# Patient Record
Sex: Male | Born: 1990 | Race: White | Hispanic: No | Marital: Married | Smoking: Never smoker
Health system: Southern US, Community
[De-identification: ages and names within clinical notes are randomized; demographics above are authoritative.]

## PROBLEM LIST (undated history)

## (undated) DIAGNOSIS — K631 Perforation of intestine (nontraumatic): Secondary | ICD-10-CM

## (undated) DIAGNOSIS — I10 Essential (primary) hypertension: Secondary | ICD-10-CM

## (undated) DIAGNOSIS — M109 Gout, unspecified: Secondary | ICD-10-CM

---

## 2012-12-21 ENCOUNTER — Emergency Department: Payer: Self-pay | Admitting: Emergency Medicine

## 2019-11-20 ENCOUNTER — Emergency Department: Admission: EM | Admit: 2019-11-20 | Discharge: 2019-11-20 | Discharge status: Against Medical Advice | Payer: Self-pay

## 2019-11-20 ENCOUNTER — Other Ambulatory Visit: Payer: Self-pay

## 2019-11-20 NOTE — ED Triage Notes (Signed)
PT STATES HE IS LEAVING.

## 2019-11-22 ENCOUNTER — Ambulatory Visit
Admission: EM | Admit: 2019-11-22 | Discharge: 2019-11-22 | Disposition: A | Discharge status: Home / Self Care | Payer: No Typology Code available for payment source | Source: Home / Self Care | Attending: Family Medicine | Admitting: Family Medicine

## 2019-11-22 ENCOUNTER — Encounter
Admission: EM | Disposition: A | Discharge status: Home / Self Care | Payer: Self-pay | Source: Home / Self Care | Attending: General Surgery

## 2019-11-22 ENCOUNTER — Ambulatory Visit (INDEPENDENT_AMBULATORY_CARE_PROVIDER_SITE_OTHER): Payer: No Typology Code available for payment source

## 2019-11-22 ENCOUNTER — Emergency Department: Payer: No Typology Code available for payment source

## 2019-11-22 ENCOUNTER — Inpatient Hospital Stay: Payer: No Typology Code available for payment source | Admitting: Anesthesiology

## 2019-11-22 ENCOUNTER — Other Ambulatory Visit: Payer: Self-pay

## 2019-11-22 ENCOUNTER — Inpatient Hospital Stay
Admission: EM | Admit: 2019-11-22 | Discharge: 2019-12-01 | DRG: 853 | Disposition: A | Discharge status: Home / Self Care | Payer: No Typology Code available for payment source | Source: Ambulatory Visit | Attending: General Surgery | Admitting: General Surgery

## 2019-11-22 ENCOUNTER — Encounter: Payer: Self-pay | Admitting: Emergency Medicine

## 2019-11-22 DIAGNOSIS — K668 Other specified disorders of peritoneum: Secondary | ICD-10-CM

## 2019-11-22 DIAGNOSIS — R52 Pain, unspecified: Secondary | ICD-10-CM

## 2019-11-22 DIAGNOSIS — R1010 Upper abdominal pain, unspecified: Secondary | ICD-10-CM

## 2019-11-22 DIAGNOSIS — R509 Fever, unspecified: Secondary | ICD-10-CM | POA: Diagnosis not present

## 2019-11-22 DIAGNOSIS — E669 Obesity, unspecified: Secondary | ICD-10-CM | POA: Diagnosis present

## 2019-11-22 DIAGNOSIS — Z87891 Personal history of nicotine dependence: Secondary | ICD-10-CM | POA: Insufficient documentation

## 2019-11-22 DIAGNOSIS — I10 Essential (primary) hypertension: Secondary | ICD-10-CM | POA: Diagnosis present

## 2019-11-22 DIAGNOSIS — R0602 Shortness of breath: Secondary | ICD-10-CM | POA: Insufficient documentation

## 2019-11-22 DIAGNOSIS — K5720 Diverticulitis of large intestine with perforation and abscess without bleeding: Secondary | ICD-10-CM | POA: Diagnosis present

## 2019-11-22 DIAGNOSIS — A419 Sepsis, unspecified organism: Secondary | ICD-10-CM | POA: Diagnosis not present

## 2019-11-22 DIAGNOSIS — Z20822 Contact with and (suspected) exposure to covid-19: Secondary | ICD-10-CM | POA: Insufficient documentation

## 2019-11-22 DIAGNOSIS — Z880 Allergy status to penicillin: Secondary | ICD-10-CM

## 2019-11-22 DIAGNOSIS — K56609 Unspecified intestinal obstruction, unspecified as to partial versus complete obstruction: Secondary | ICD-10-CM | POA: Insufficient documentation

## 2019-11-22 DIAGNOSIS — M109 Gout, unspecified: Secondary | ICD-10-CM | POA: Diagnosis present

## 2019-11-22 DIAGNOSIS — K56600 Partial intestinal obstruction, unspecified as to cause: Secondary | ICD-10-CM

## 2019-11-22 DIAGNOSIS — Z6834 Body mass index (BMI) 34.0-34.9, adult: Secondary | ICD-10-CM | POA: Diagnosis not present

## 2019-11-22 DIAGNOSIS — K650 Generalized (acute) peritonitis: Secondary | ICD-10-CM | POA: Diagnosis present

## 2019-11-22 DIAGNOSIS — K651 Peritoneal abscess: Secondary | ICD-10-CM

## 2019-11-22 DIAGNOSIS — R000 Tachycardia, unspecified: Secondary | ICD-10-CM | POA: Diagnosis not present

## 2019-11-22 DIAGNOSIS — K631 Perforation of intestine (nontraumatic): Secondary | ICD-10-CM

## 2019-11-22 HISTORY — PX: PARTIAL COLECTOMY: SHX5273

## 2019-11-22 HISTORY — PX: LAPAROTOMY: SHX154

## 2019-11-22 HISTORY — PX: COLOSTOMY: SHX63

## 2019-11-22 LAB — CBC WITH DIFFERENTIAL/PLATELET
Abs Immature Granulocytes: 0.15 10*3/uL — ABNORMAL HIGH (ref 0.00–0.07)
Basophils Absolute: 0 10*3/uL (ref 0.0–0.1)
Basophils Relative: 0 %
Eosinophils Absolute: 0 10*3/uL (ref 0.0–0.5)
Eosinophils Relative: 0 %
HCT: 44.1 % (ref 39.0–52.0)
Hemoglobin: 15.4 g/dL (ref 13.0–17.0)
Immature Granulocytes: 1 %
Lymphocytes Relative: 7 %
Lymphs Abs: 1.3 10*3/uL (ref 0.7–4.0)
MCH: 32.4 pg (ref 26.0–34.0)
MCHC: 34.9 g/dL (ref 30.0–36.0)
MCV: 92.6 fL (ref 80.0–100.0)
Monocytes Absolute: 1.6 10*3/uL — ABNORMAL HIGH (ref 0.1–1.0)
Monocytes Relative: 8 %
Neutro Abs: 15.7 10*3/uL — ABNORMAL HIGH (ref 1.7–7.7)
Neutrophils Relative %: 84 %
Platelets: 255 10*3/uL (ref 150–400)
RBC: 4.76 MIL/uL (ref 4.22–5.81)
RDW: 13 % (ref 11.5–15.5)
WBC: 18.7 10*3/uL — ABNORMAL HIGH (ref 4.0–10.5)
nRBC: 0 % (ref 0.0–0.2)

## 2019-11-22 LAB — PROTIME-INR
INR: 1.1 (ref 0.8–1.2)
Prothrombin Time: 14.4 seconds (ref 11.4–15.2)

## 2019-11-22 LAB — COMPREHENSIVE METABOLIC PANEL
ALT: 24 U/L (ref 0–44)
AST: 24 U/L (ref 15–41)
Albumin: 3.6 g/dL (ref 3.5–5.0)
Alkaline Phosphatase: 56 U/L (ref 38–126)
Anion gap: 15 (ref 5–15)
BUN: 17 mg/dL (ref 6–20)
CO2: 26 mmol/L (ref 22–32)
Calcium: 9.4 mg/dL (ref 8.9–10.3)
Chloride: 91 mmol/L — ABNORMAL LOW (ref 98–111)
Creatinine, Ser: 1.04 mg/dL (ref 0.61–1.24)
GFR calc Af Amer: >60 mL/min (ref >60)
GFR calc non Af Amer: >60 mL/min (ref >60)
Glucose, Bld: 148 mg/dL — ABNORMAL HIGH (ref 70–99)
Potassium: 3.5 mmol/L (ref 3.5–5.1)
Sodium: 132 mmol/L — ABNORMAL LOW (ref 135–145)
Total Bilirubin: 1.6 mg/dL — ABNORMAL HIGH (ref 0.3–1.2)
Total Protein: 8.4 g/dL — ABNORMAL HIGH (ref 6.5–8.1)

## 2019-11-22 LAB — LACTIC ACID, PLASMA: Lactic Acid, Venous: 1.3 mmol/L (ref 0.5–1.9)

## 2019-11-22 LAB — RESPIRATORY PANEL BY RT PCR (FLU A&B, COVID)
Influenza A by PCR: NEGATIVE
Influenza B by PCR: NEGATIVE
SARS Coronavirus 2 by RT PCR: NEGATIVE

## 2019-11-22 LAB — HIV ANTIBODY (ROUTINE TESTING W REFLEX): HIV Screen 4th Generation wRfx: NONREACTIVE

## 2019-11-22 LAB — SARS CORONAVIRUS 2 (TAT 6-24 HRS): SARS Coronavirus 2: NEGATIVE

## 2019-11-22 IMAGING — CR DG ABDOMEN DECUB ONLY 1V
2 series · 2 of 2 positions shown · non-contrast
Comparison: Chest x-ray same date.

CLINICAL DATA: Suspected free intraperitoneal air on chest x-ray.

EXAM:
ABDOMEN - 1 VIEW DECUBITUS

[abdomen decu (1 of 2)]
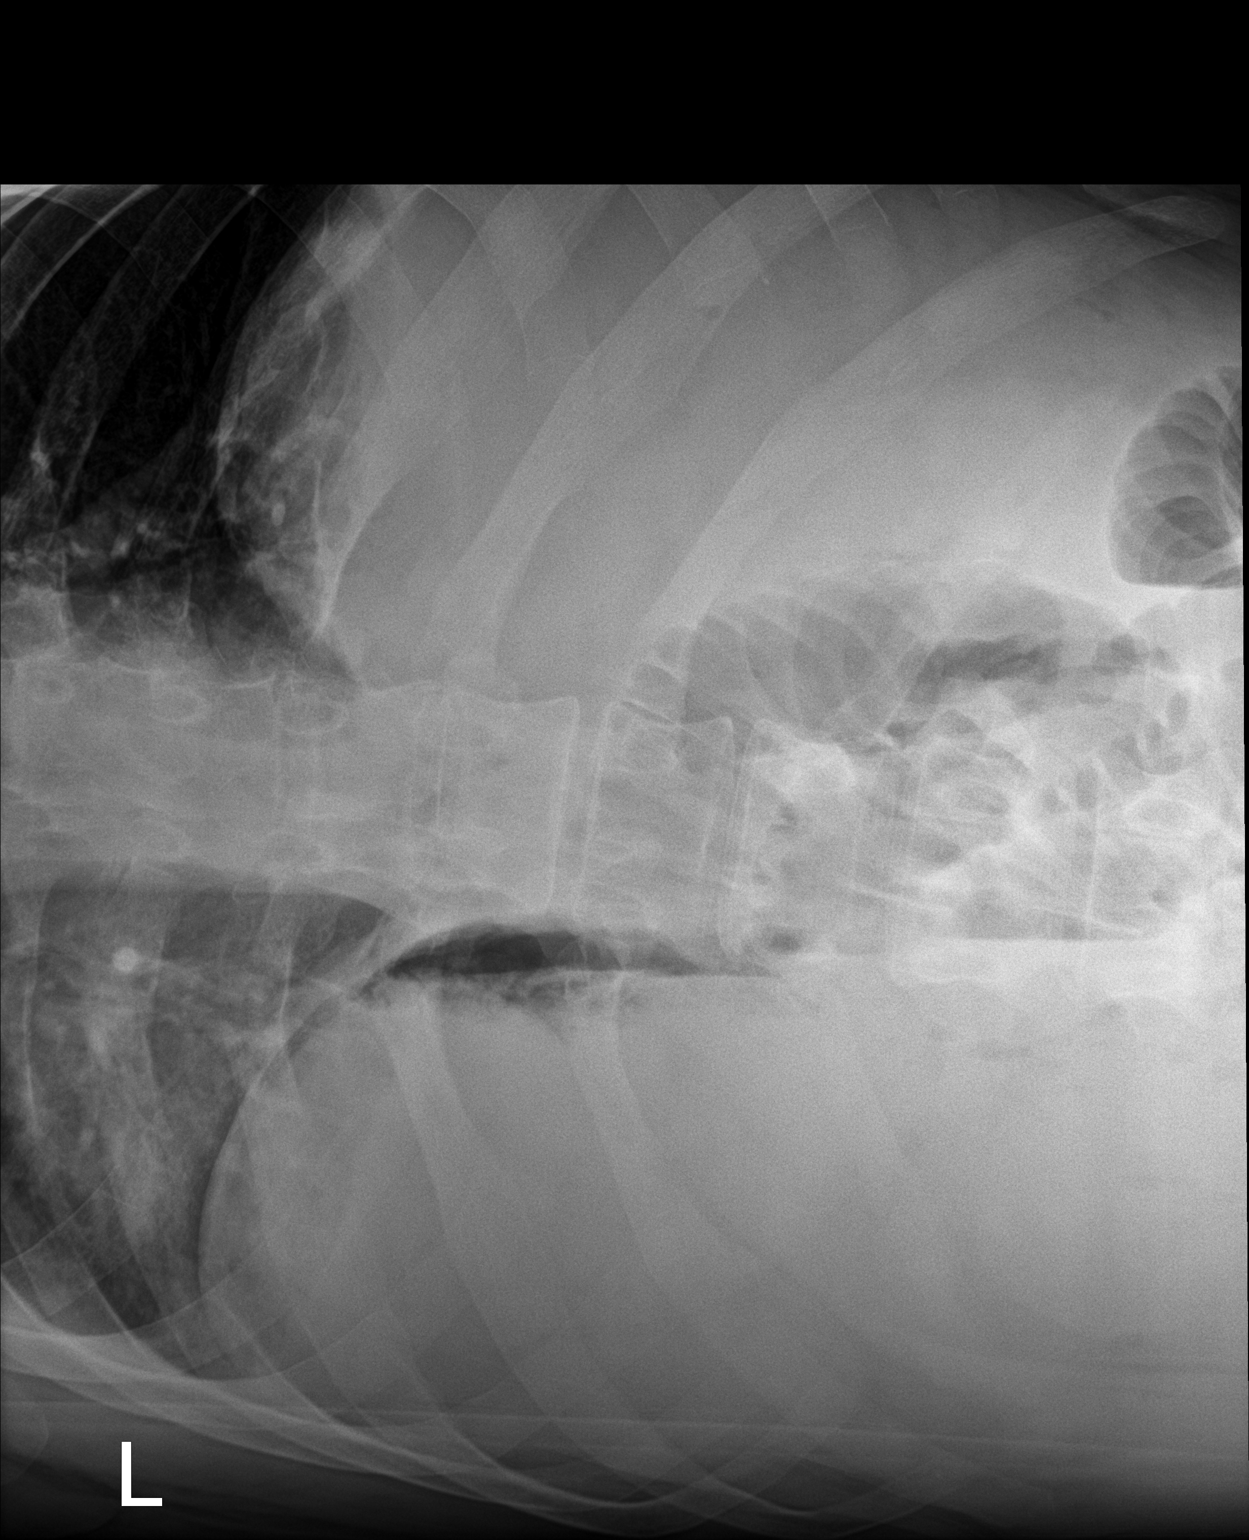

[abdomen decu (2 of 2)]
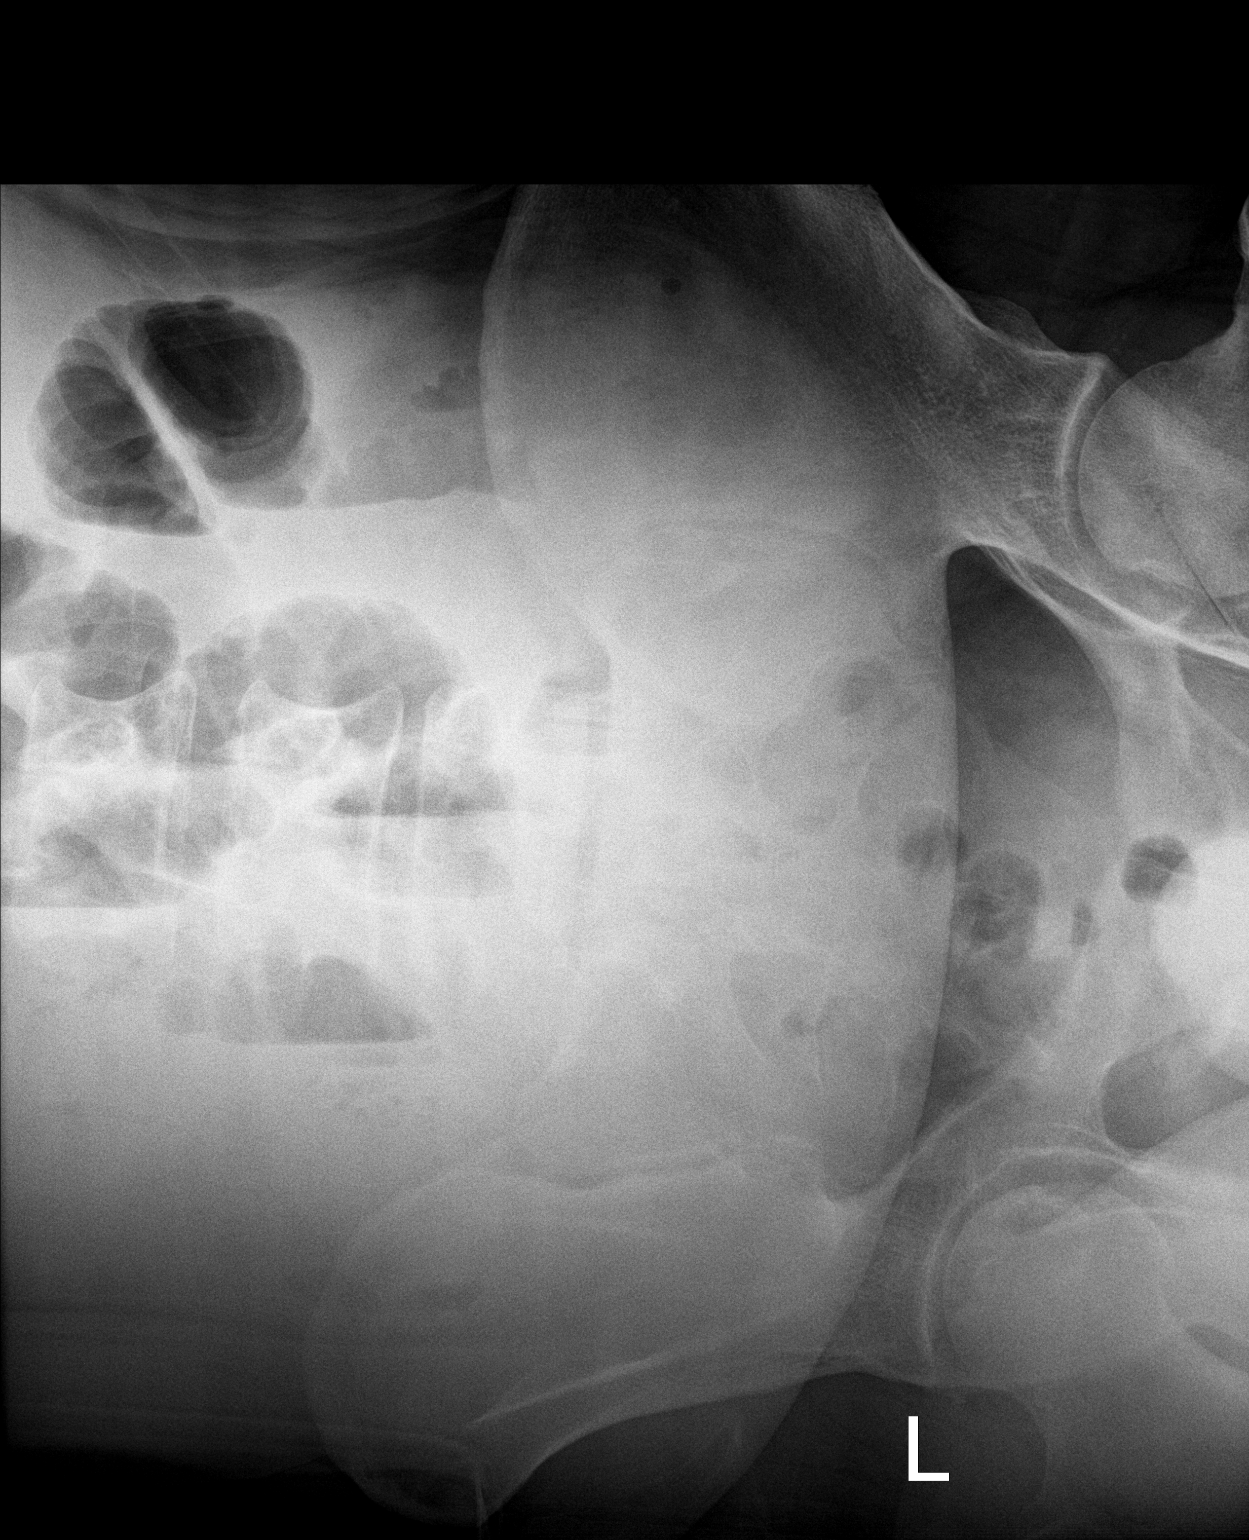

[2 of 2 positions shown; findings below may reference images not displayed]

FINDINGS: Dilated small bowel loops with air-fluid levels suggesting small
bowel obstruction. The decubitus film confirms free intraperitoneal
air suggesting hollow viscus perforation. Recommend surgical
consultation.

Bibasilar atelectasis.
IMPRESSION: 1. Free intraperitoneal air suggesting hollow viscus perforation.
Recommend surgical consultation and possible CT scan.
2. Dilated small bowel loops with air-fluid levels suggesting small
bowel obstruction.

These results were called by telephone at the time of interpretation
on [DATE] at [DATE] to provider NING , who verbally
acknowledged these results.

## 2019-11-22 IMAGING — CR DG CHEST 2V
2 series · 2 of 2 positions shown · non-contrast
Comparison: None.

CLINICAL DATA: Fever and shortness of breath

EXAM:
CHEST - 2 VIEW

[chest pa]
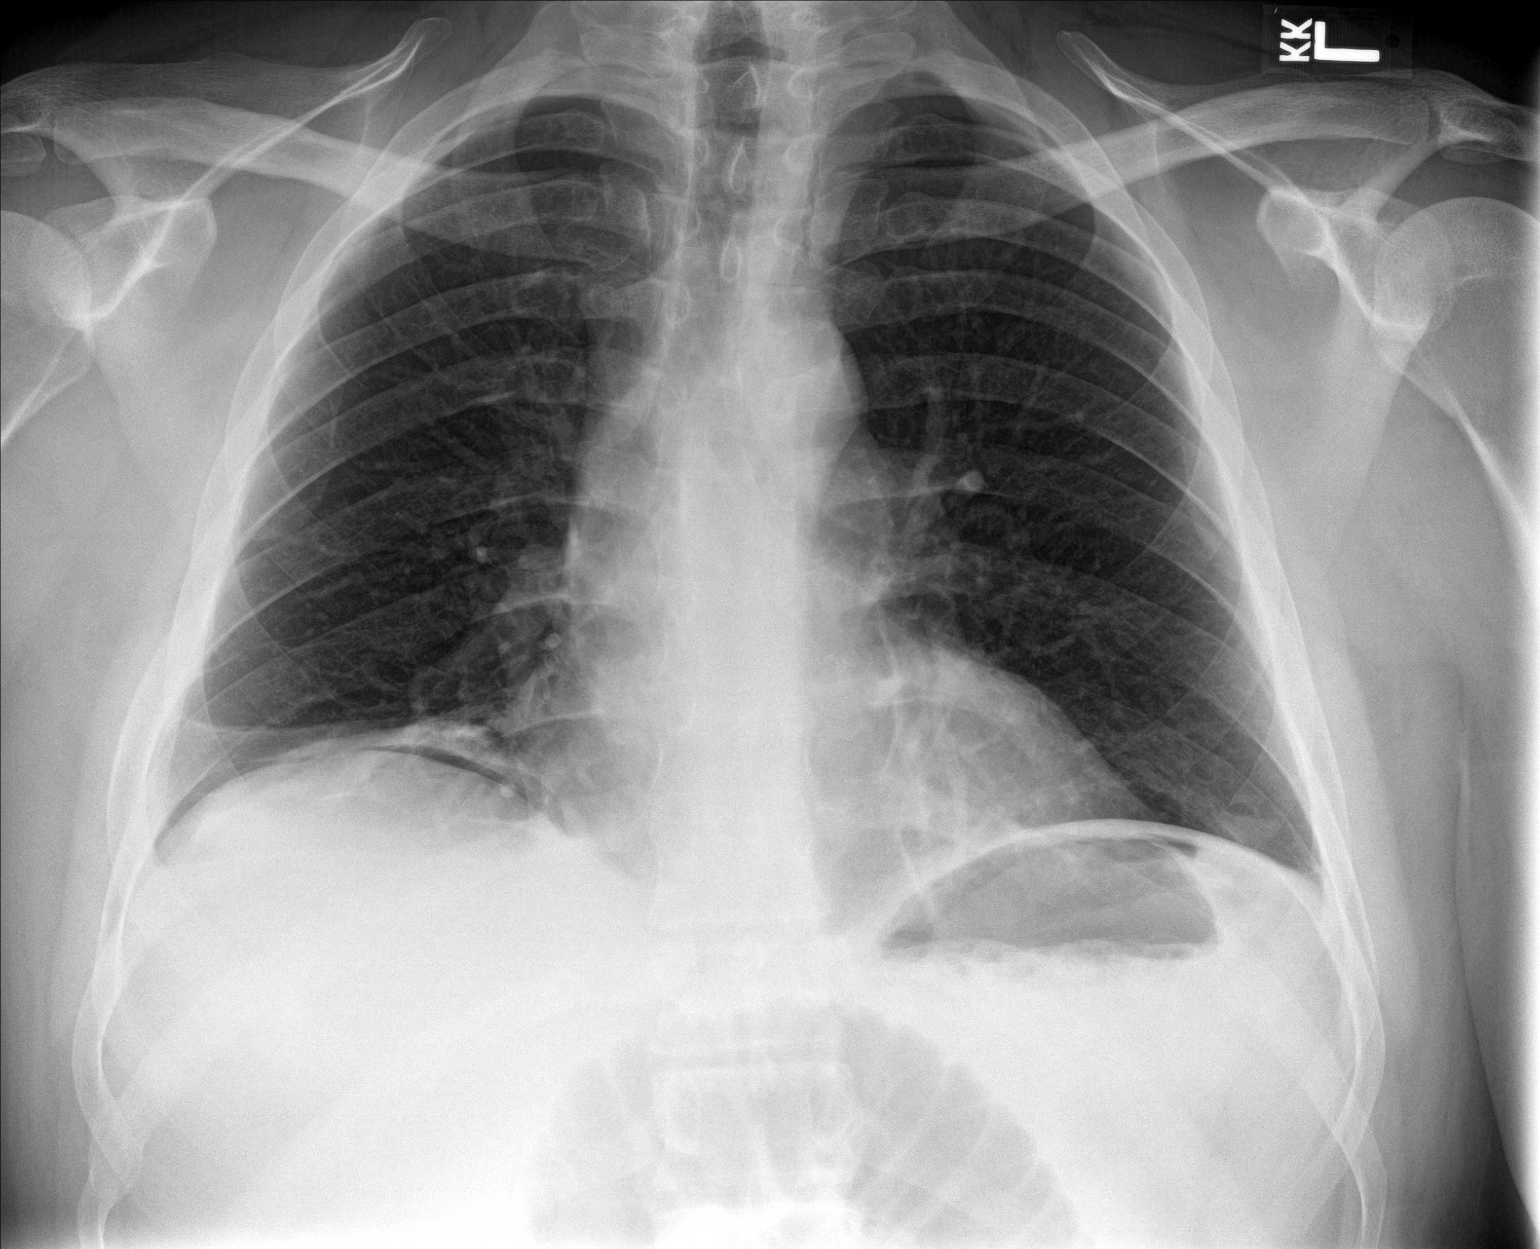

[chest lat]
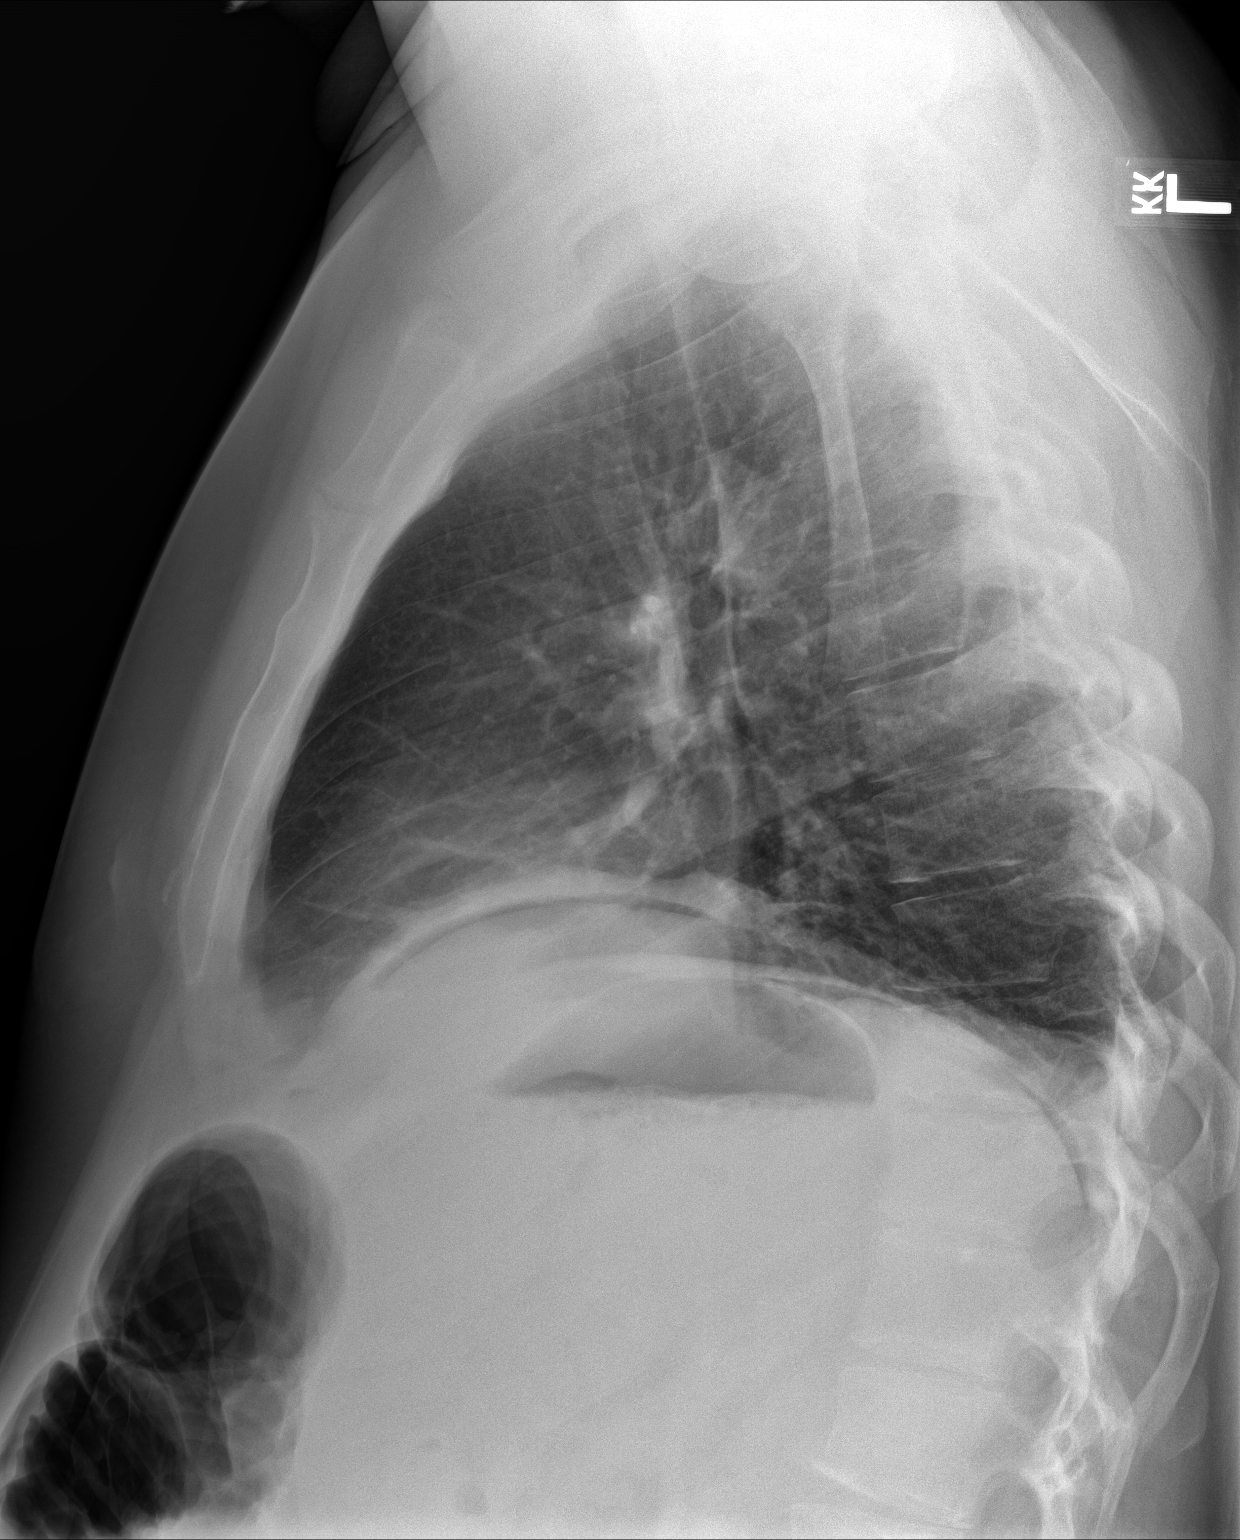

[2 of 2 positions shown; findings below may reference images not displayed]

FINDINGS: There is atelectatic change in the lung bases, more on the right
than on the left. Heart size and pulmonary vascularity are normal.
No adenopathy.

There is suspected pneumoperitoneum, particularly on the lateral
view.
IMPRESSION: Concern for potential pneumoperitoneum. Advise left lateral
decubitus abdomen radiograph to further evaluate. Bibasilar
atelectasis, more notable on the left than on the right. Lungs
elsewhere clear. Cardiac silhouette normal.

Critical Value/emergent results were called by telephone at the time
of interpretation on [DATE] at [DATE] to provider JUMPER
, who verbally acknowledged these results.

## 2019-11-22 IMAGING — CT CT ABD-PELV W/ CM
2 of 4 series · 15 of 46 positions shown, 17 images · IV contrast (APPLIED)
Comparison: Radiograph [DATE]

CLINICAL DATA: Suspected small bowel obstruction and perforation.

EXAM:
CT ABDOMEN AND PELVIS WITH CONTRAST
TECHNIQUE: Multidetector CT imaging of the abdomen and pelvis was performed
using the standard protocol following bolus administration of
intravenous contrast.
CONTRAST:  100mL OMNIPAQUE IOHEXOL 300 MG/ML  SOLN

[Series 2: routine abd/pel with · axial · 0.92mm/px · z∈[-365,+140]mm · 12 of 116 slices shown, 14 images]
[im 10/116  soft-tissue]
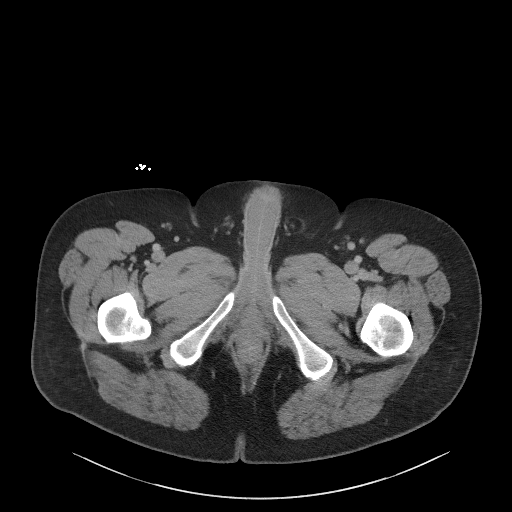
[im 10/116  bone]
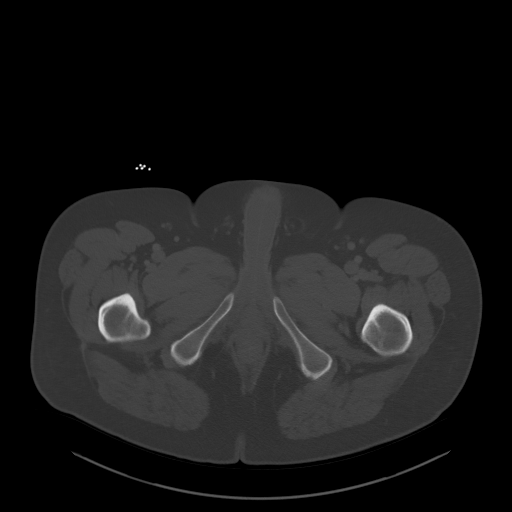
[im 19/116  soft-tissue]
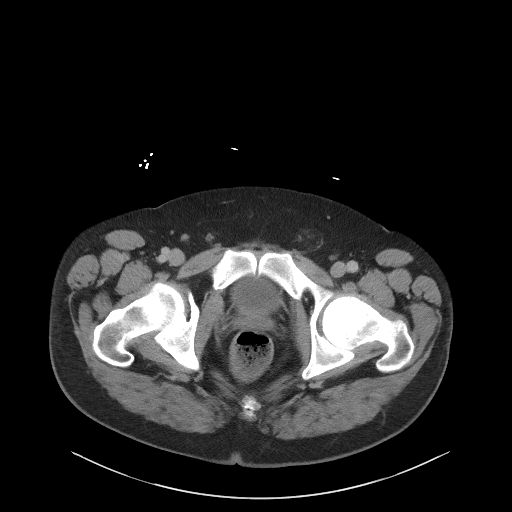
[im 28/116  soft-tissue]
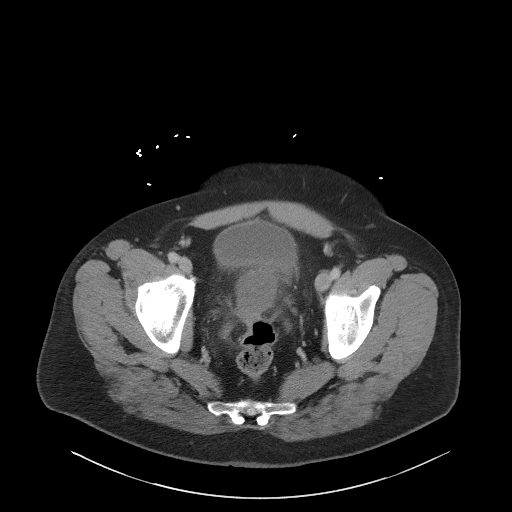
[im 37/116  soft-tissue]
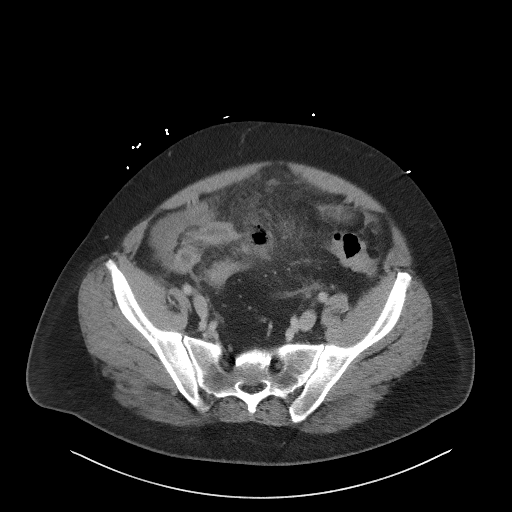
[im 47/116  soft-tissue]
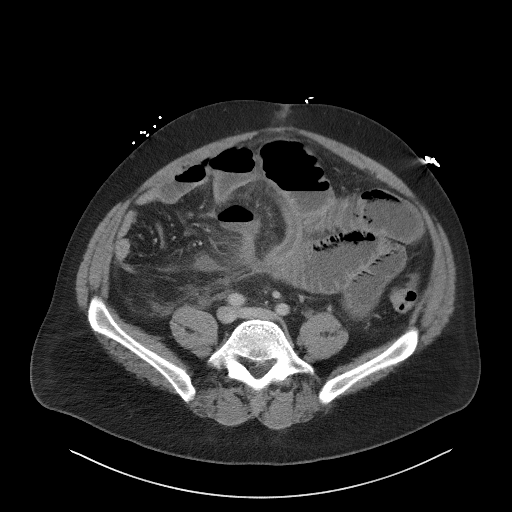
[im 56/116  soft-tissue]
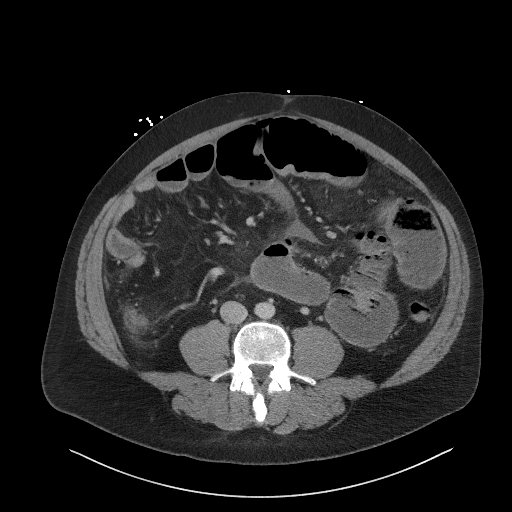
[im 65/116  soft-tissue]
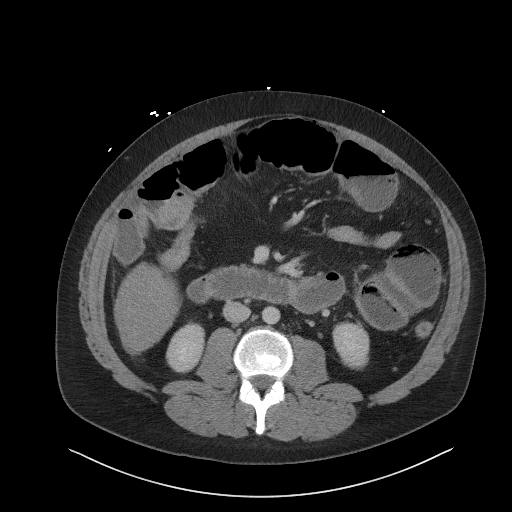
[im 74/116  soft-tissue]
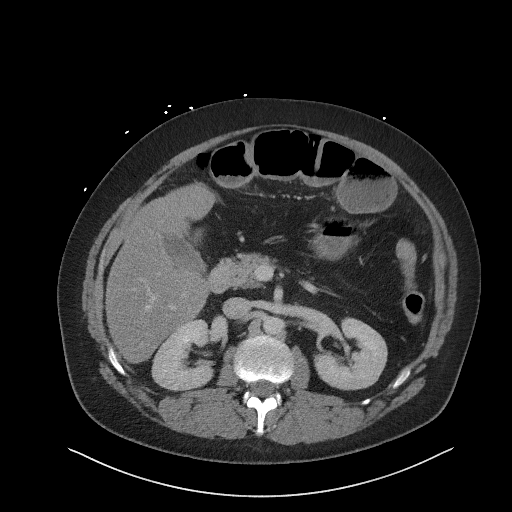
[im 83/116  soft-tissue]
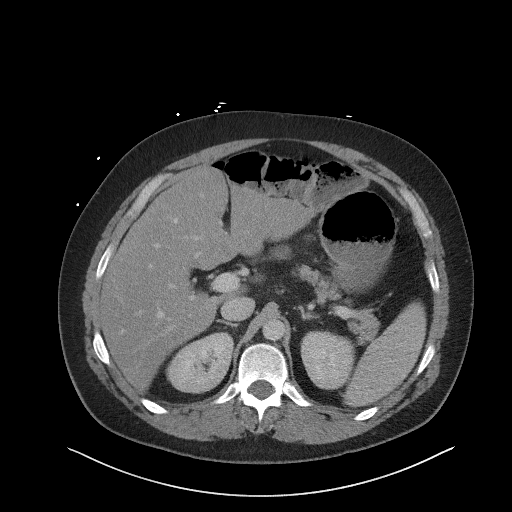
[im 83/116  bone]
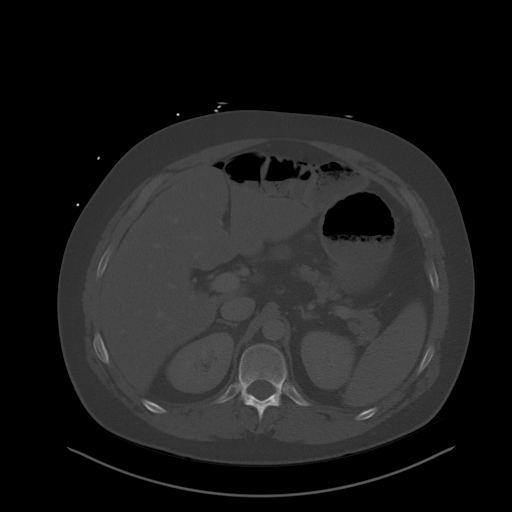
[im 93/116  soft-tissue]
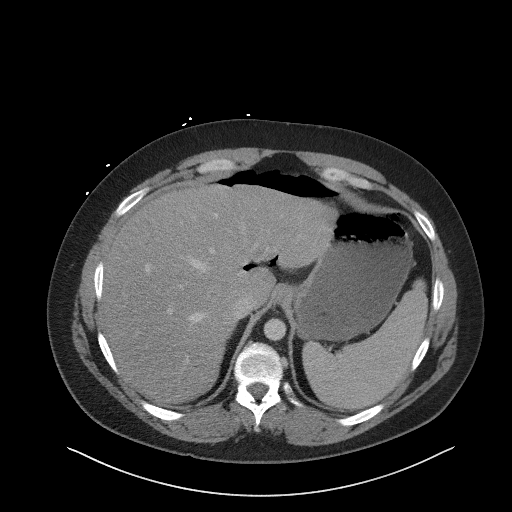
[im 102/116  soft-tissue]
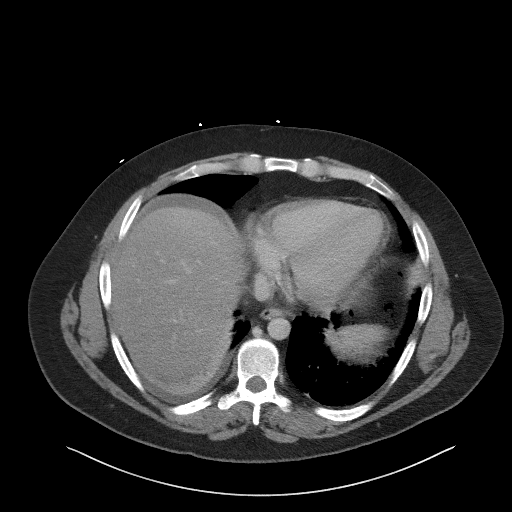
[im 111/116  soft-tissue]
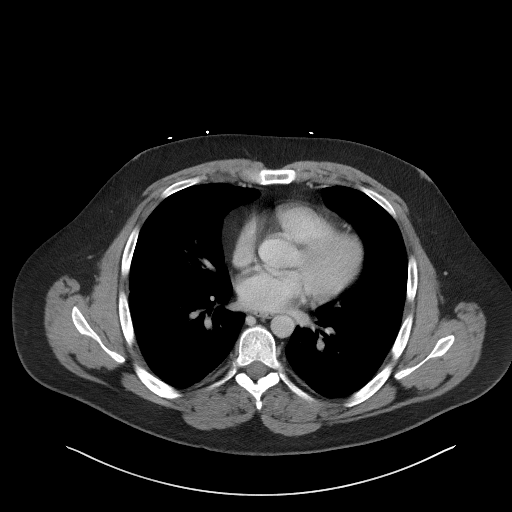

[Series 5: coronal st · coronal · 0.93mm/px · 3 of 117 slices shown]
[im 39/117  soft-tissue]
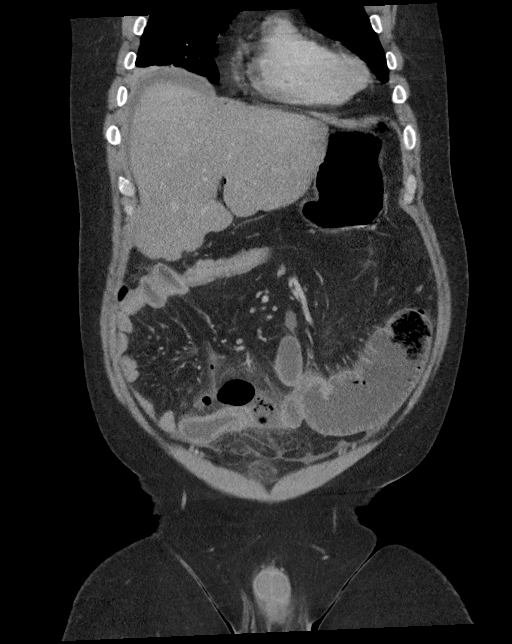
[im 52/117  soft-tissue]
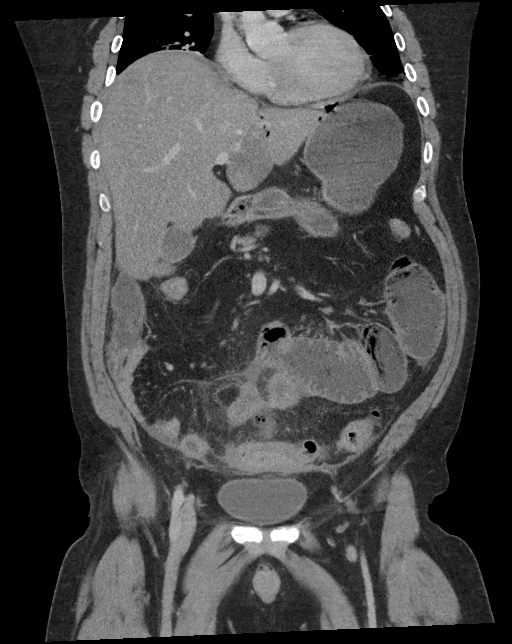
[im 65/117  soft-tissue]
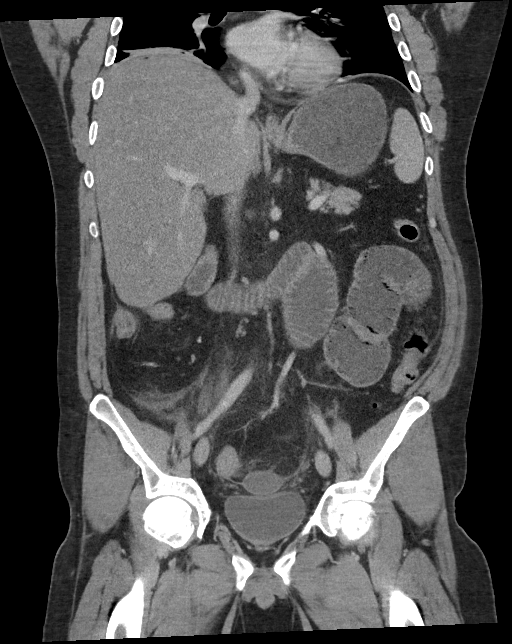

[15 of 46 positions shown; findings below may reference images not displayed]

FINDINGS: Lower chest: No acute abnormality.

Hepatobiliary: Area of hypoattenuation in the left lobe of the liver
measures 3.6 x 3.2 cm. The gallbladder is normal.

Pancreas: Unremarkable. No pancreatic ductal dilatation or
surrounding inflammatory changes.

Spleen: Normal in size without focal abnormality.

Adrenals/Urinary Tract: Adrenal glands are unremarkable. Kidneys are
normal, without renal calculi, focal lesion, or hydronephrosis.
Bladder is unremarkable.

Stomach/Bowel: There is eccentric irregular thickening of
approximately 10 cm of the sigmoid colon with several scattered
diverticula within the area of abnormal colon. An abscess containing
free fluid and gas arises from the midportion of the abnormal
sigmoid colon and ascends to the mid abdomen measuring approximately
9 x 5.3 x 4.5 cm. There is inflammatory mesenteric stranding and
retraction to the site of abscess, which serves as a transitional
point for an upstream small bowel obstruction with bowel loops
measuring up to 4.7 cm. The downstream small bowel and the remainder
of the colon are decompressed. Additionally, there is a complex
fluid collection posterior and inferior to the sigmoid colon. Free
fluid and gas extend to perihepatic location tracking under the
right hemidiaphragm.

Vascular/Lymphatic: No significant vascular findings are present. No
enlarged abdominal or pelvic lymph nodes.

Reproductive: Prostate is unremarkable.

Other: No abdominal wall hernia or abnormality.

Musculoskeletal: No acute or significant osseous findings.
IMPRESSION: 1. Abnormal asymmetrically thickened sigmoid colon, likely
perforated, with subsequent 9 x 5 x 4 cm abscess extending from the
midportion of the abnormal sigmoid colon to the mid abdomen. It is
difficult to determine whether this represents a perforated
diverticulitis or colonic malignancy.
2. Extensive inflammatory changes in the mid abdomen mesentery with
subsequent upstream small bowel obstruction with bowel loops
measuring up to 4.7 cm.
3. Free fluid and gas extend to perihepatic location.
4. Area of hypoattenuation in the left lobe of the liver, which may
represent focal fatty infiltration, liver mass or an abscess.
5. These results were called by telephone at the time of
interpretation on [DATE] at [DATE] to provider DIBONWA
DIBONWA, PA , who verbally acknowledged these results.

## 2019-11-22 SURGERY — LAPAROTOMY, EXPLORATORY
Anesthesia: General

## 2019-11-22 MED ORDER — SODIUM CHLORIDE 0.9 % IV SOLN: INTRAVENOUS | Status: DC

## 2019-11-22 MED ORDER — SODIUM CHLORIDE 0.9 % IV BOLUS (SEPSIS): 1000.0000 mL | Freq: Once | INTRAVENOUS | Status: DC

## 2019-11-22 MED ORDER — HYDROMORPHONE HCL 1 MG/ML IJ SOLN
INTRAMUSCULAR | Status: DC | PRN
  Administered 2019-11-22: .6 mg via INTRAVENOUS
  Administered 2019-11-22: .4 mg via INTRAVENOUS

## 2019-11-22 MED ORDER — ONDANSETRON 4 MG PO TBDP: 4.0000 mg | ORAL_TABLET | Freq: Four times a day (QID) | ORAL | Status: DC | PRN

## 2019-11-22 MED ORDER — PROPOFOL 10 MG/ML IV BOLUS
INTRAVENOUS | Status: DC | PRN
  Administered 2019-11-22: 200 mg via INTRAVENOUS

## 2019-11-22 MED ORDER — ACETAMINOPHEN 325 MG PO TABS
650.0000 mg | ORAL_TABLET | Freq: Four times a day (QID) | ORAL | Status: DC | PRN
  Administered 2019-11-22 – 2019-11-29 (×3): 650 mg via ORAL
  Filled 2019-11-22 (×3): qty 2

## 2019-11-22 MED ORDER — ROCURONIUM BROMIDE 100 MG/10ML IV SOLN
INTRAVENOUS | Status: DC | PRN
  Administered 2019-11-22: 20 mg via INTRAVENOUS
  Administered 2019-11-22: 40 mg via INTRAVENOUS
  Administered 2019-11-22: 20 mg via INTRAVENOUS

## 2019-11-22 MED ORDER — ENOXAPARIN SODIUM 40 MG/0.4ML ~~LOC~~ SOLN
40.0000 mg | SUBCUTANEOUS | Status: DC
  Administered 2019-11-22 – 2019-11-30 (×9): 40 mg via SUBCUTANEOUS
  Filled 2019-11-22 (×9): qty 0.4

## 2019-11-22 MED ORDER — OXYCODONE HCL 5 MG/5ML PO SOLN: 5.0000 mg | Freq: Once | ORAL | Status: DC | PRN

## 2019-11-22 MED ORDER — PIPERACILLIN-TAZOBACTAM 3.375 G IVPB 30 MIN: 3.3750 g | Freq: Once | INTRAVENOUS | Status: DC

## 2019-11-22 MED ORDER — LACTATED RINGERS IV SOLN: INTRAVENOUS | Status: DC | PRN

## 2019-11-22 MED ORDER — DEXTROSE 5 % IV SOLN
INTRAVENOUS | Status: DC | PRN
  Administered 2019-11-22: 18:00:00 2 g via INTRAVENOUS

## 2019-11-22 MED ORDER — OXYCODONE HCL 5 MG PO TABS: 5.0000 mg | ORAL_TABLET | Freq: Once | ORAL | Status: DC | PRN

## 2019-11-22 MED ORDER — ALBUMIN HUMAN 5 % IV SOLN
INTRAVENOUS | Status: AC
  Filled 2019-11-22: qty 500

## 2019-11-22 MED ORDER — FENTANYL CITRATE (PF) 100 MCG/2ML IJ SOLN
INTRAMUSCULAR | Status: DC | PRN
  Administered 2019-11-22: 100 ug via INTRAVENOUS
  Administered 2019-11-22: 50 ug via INTRAVENOUS

## 2019-11-22 MED ORDER — IOHEXOL 300 MG/ML  SOLN
100.0000 mL | Freq: Once | INTRAMUSCULAR | Status: AC | PRN
  Administered 2019-11-22: 100 mL via INTRAVENOUS

## 2019-11-22 MED ORDER — CIPROFLOXACIN IN D5W 400 MG/200ML IV SOLN: 400.0000 mg | Freq: Two times a day (BID) | INTRAVENOUS | Status: DC

## 2019-11-22 MED ORDER — HYDROCODONE-ACETAMINOPHEN 5-325 MG PO TABS
1.0000 | ORAL_TABLET | ORAL | Status: DC | PRN
  Administered 2019-11-25: 1 via ORAL
  Administered 2019-11-26 – 2019-11-30 (×2): 2 via ORAL
  Filled 2019-11-22: qty 2
  Filled 2019-11-22: qty 1
  Filled 2019-11-22: qty 2

## 2019-11-22 MED ORDER — SODIUM CHLORIDE 0.9 % IV BOLUS
1000.0000 mL | Freq: Once | INTRAVENOUS | Status: AC
  Administered 2019-11-22: 1000 mL via INTRAVENOUS

## 2019-11-22 MED ORDER — KETAMINE HCL 50 MG/ML IJ SOLN
INTRAMUSCULAR | Status: AC
  Filled 2019-11-22: qty 10

## 2019-11-22 MED ORDER — LIDOCAINE HCL (CARDIAC) PF 100 MG/5ML IV SOSY
PREFILLED_SYRINGE | INTRAVENOUS | Status: DC | PRN
  Administered 2019-11-22: 100 mg via INTRAVENOUS

## 2019-11-22 MED ORDER — DEXAMETHASONE SODIUM PHOSPHATE 10 MG/ML IJ SOLN
INTRAMUSCULAR | Status: AC
  Filled 2019-11-22: qty 1

## 2019-11-22 MED ORDER — SODIUM CHLORIDE 0.9 % IV SOLN
2.0000 g | INTRAVENOUS | Status: DC
  Administered 2019-11-23 – 2019-11-30 (×8): 2 g via INTRAVENOUS
  Filled 2019-11-22: qty 2
  Filled 2019-11-22: qty 20
  Filled 2019-11-22: qty 2
  Filled 2019-11-22: qty 20
  Filled 2019-11-22 (×2): qty 2
  Filled 2019-11-22 (×2): qty 20
  Filled 2019-11-22 (×2): qty 2
  Filled 2019-11-22: qty 20

## 2019-11-22 MED ORDER — EPHEDRINE 5 MG/ML INJ
INTRAVENOUS | Status: AC
  Filled 2019-11-22: qty 10

## 2019-11-22 MED ORDER — PROPOFOL 10 MG/ML IV BOLUS
INTRAVENOUS | Status: AC
  Filled 2019-11-22: qty 20

## 2019-11-22 MED ORDER — SUCCINYLCHOLINE CHLORIDE 200 MG/10ML IV SOSY
PREFILLED_SYRINGE | INTRAVENOUS | Status: AC
  Filled 2019-11-22: qty 10

## 2019-11-22 MED ORDER — BUPIVACAINE-EPINEPHRINE (PF) 0.5% -1:200000 IJ SOLN
INTRAMUSCULAR | Status: DC | PRN
  Administered 2019-11-22: 30 mL via PERINEURAL

## 2019-11-22 MED ORDER — HYDROMORPHONE HCL 1 MG/ML IJ SOLN
INTRAMUSCULAR | Status: AC
  Filled 2019-11-22: qty 1

## 2019-11-22 MED ORDER — FENTANYL CITRATE (PF) 100 MCG/2ML IJ SOLN
INTRAMUSCULAR | Status: AC
  Filled 2019-11-22: qty 2

## 2019-11-22 MED ORDER — HYDROMORPHONE HCL 1 MG/ML IJ SOLN: 0.2500 mg | INTRAMUSCULAR | Status: DC | PRN

## 2019-11-22 MED ORDER — ACETAMINOPHEN 650 MG RE SUPP: 650.0000 mg | Freq: Four times a day (QID) | RECTAL | Status: DC | PRN

## 2019-11-22 MED ORDER — MIDAZOLAM HCL 2 MG/2ML IJ SOLN
INTRAMUSCULAR | Status: DC | PRN
  Administered 2019-11-22: 2 mg via INTRAVENOUS

## 2019-11-22 MED ORDER — ONDANSETRON HCL 4 MG/2ML IJ SOLN
4.0000 mg | Freq: Four times a day (QID) | INTRAMUSCULAR | Status: DC | PRN
  Administered 2019-11-22 – 2019-11-25 (×4): 4 mg via INTRAVENOUS
  Filled 2019-11-22 (×5): qty 2

## 2019-11-22 MED ORDER — FENTANYL CITRATE (PF) 100 MCG/2ML IJ SOLN: 25.0000 ug | INTRAMUSCULAR | Status: DC | PRN

## 2019-11-22 MED ORDER — DEXMEDETOMIDINE HCL 200 MCG/2ML IV SOLN
INTRAVENOUS | Status: DC | PRN
  Administered 2019-11-22: 12 ug via INTRAVENOUS

## 2019-11-22 MED ORDER — SODIUM CHLORIDE 0.9 % IV SOLN
2.0000 g | Freq: Once | INTRAVENOUS | Status: AC
  Administered 2019-11-22: 2 g via INTRAVENOUS
  Filled 2019-11-22: qty 2

## 2019-11-22 MED ORDER — SODIUM CHLORIDE 0.9 % IV SOLN
INTRAVENOUS | Status: DC | PRN
  Administered 2019-11-22: 70 mL

## 2019-11-22 MED ORDER — MORPHINE SULFATE (PF) 4 MG/ML IV SOLN
4.0000 mg | INTRAVENOUS | Status: DC | PRN
  Administered 2019-11-22 – 2019-11-25 (×13): 4 mg via INTRAVENOUS
  Filled 2019-11-22 (×13): qty 1

## 2019-11-22 MED ORDER — ONDANSETRON HCL 4 MG/2ML IJ SOLN
INTRAMUSCULAR | Status: AC
  Filled 2019-11-22: qty 2

## 2019-11-22 MED ORDER — KETAMINE HCL 10 MG/ML IJ SOLN
INTRAMUSCULAR | Status: DC | PRN
  Administered 2019-11-22: 25 mg via INTRAVENOUS
  Administered 2019-11-22: 50 mg via INTRAVENOUS

## 2019-11-22 MED ORDER — ROCURONIUM BROMIDE 10 MG/ML (PF) SYRINGE
PREFILLED_SYRINGE | INTRAVENOUS | Status: AC
  Filled 2019-11-22: qty 10

## 2019-11-22 MED ORDER — ACETAMINOPHEN 500 MG PO TABS
1000.0000 mg | ORAL_TABLET | Freq: Once | ORAL | Status: AC | PRN
  Administered 2019-11-24: 1000 mg via ORAL
  Filled 2019-11-22 (×2): qty 2

## 2019-11-22 MED ORDER — SODIUM CHLORIDE 0.9 % IV SOLN: Freq: Once | INTRAVENOUS | Status: AC

## 2019-11-22 MED ORDER — METRONIDAZOLE IN NACL 5-0.79 MG/ML-% IV SOLN
500.0000 mg | Freq: Three times a day (TID) | INTRAVENOUS | Status: DC
  Administered 2019-11-22 – 2019-11-30 (×25): 500 mg via INTRAVENOUS
  Filled 2019-11-22 (×29): qty 100

## 2019-11-22 MED ORDER — PHENYLEPHRINE HCL (PRESSORS) 10 MG/ML IV SOLN
INTRAVENOUS | Status: DC | PRN
  Administered 2019-11-22: 100 ug via INTRAVENOUS

## 2019-11-22 MED ORDER — PANTOPRAZOLE SODIUM 40 MG IV SOLR
40.0000 mg | Freq: Every day | INTRAVENOUS | Status: DC
  Administered 2019-11-23 – 2019-11-30 (×9): 40 mg via INTRAVENOUS
  Filled 2019-11-22 (×9): qty 40

## 2019-11-22 MED ORDER — SUGAMMADEX SODIUM 200 MG/2ML IV SOLN
INTRAVENOUS | Status: DC | PRN
  Administered 2019-11-22: 400 mg via INTRAVENOUS

## 2019-11-22 MED ORDER — MIDAZOLAM HCL 2 MG/2ML IJ SOLN
INTRAMUSCULAR | Status: AC
  Filled 2019-11-22: qty 2

## 2019-11-22 MED ORDER — METRONIDAZOLE IN NACL 5-0.79 MG/ML-% IV SOLN
500.0000 mg | Freq: Once | INTRAVENOUS | Status: DC
  Filled 2019-11-22: qty 100

## 2019-11-22 MED ORDER — SUCCINYLCHOLINE CHLORIDE 20 MG/ML IJ SOLN
INTRAMUSCULAR | Status: DC | PRN
  Administered 2019-11-22: 200 mg via INTRAVENOUS

## 2019-11-22 SURGICAL SUPPLY — 81 items
APPLIER CLIP ROT 10 11.4 M/L (STAPLE)
BAG COUNTER SPONGE EZ (MISCELLANEOUS) IMPLANT
BULB RESERV EVAC DRAIN JP 100C (MISCELLANEOUS) ×4 IMPLANT
CANISTER SUCT 1200ML W/VALVE (MISCELLANEOUS) ×2 IMPLANT
CANNULA DILATOR 10 W/SLV (CANNULA) IMPLANT
CHLORAPREP W/TINT 26 (MISCELLANEOUS) ×2 IMPLANT
CLIP APPLIE ROT 10 11.4 M/L (STAPLE) IMPLANT
CLIP VESOCCLUDE LG 6/CT (CLIP) IMPLANT
CLIP VESOCCLUDE MED 6/CT (CLIP) IMPLANT
COVER CLAMP SIL LG PBX B (MISCELLANEOUS) IMPLANT
COVER WAND RF STERILE (DRAPES) ×2 IMPLANT
DRAIN CHANNEL JP 19F (MISCELLANEOUS) ×4 IMPLANT
DRAPE LAPAROTOMY 100X77 ABD (DRAPES) ×2 IMPLANT
DRSG OPSITE POSTOP 4X10 (GAUZE/BANDAGES/DRESSINGS) IMPLANT
DRSG OPSITE POSTOP 4X8 (GAUZE/BANDAGES/DRESSINGS) IMPLANT
DRSG TEGADERM 2-3/8X2-3/4 SM (GAUZE/BANDAGES/DRESSINGS) IMPLANT
DRSG TEGADERM 4X10 (GAUZE/BANDAGES/DRESSINGS) IMPLANT
DRSG TEGADERM 4X4.75 (GAUZE/BANDAGES/DRESSINGS) IMPLANT
DRSG TELFA 3X8 NADH (GAUZE/BANDAGES/DRESSINGS) IMPLANT
ELECT BLADE 6 FLAT ULTRCLN (ELECTRODE) ×2 IMPLANT
ELECT REM PT RETURN 9FT ADLT (ELECTROSURGICAL) ×2
ELECTRODE REM PT RTRN 9FT ADLT (ELECTROSURGICAL) ×1 IMPLANT
GLOVE BIO SURGEON STRL SZ 6.5 (GLOVE) ×6 IMPLANT
GLOVE BIOGEL PI IND STRL 6.5 (GLOVE) ×2 IMPLANT
GLOVE BIOGEL PI INDICATOR 6.5 (GLOVE) ×2
GOWN STRL REUS W/ TWL LRG LVL3 (GOWN DISPOSABLE) ×3 IMPLANT
GOWN STRL REUS W/TWL LRG LVL3 (GOWN DISPOSABLE) ×3
HANDLE YANKAUER SUCT BULB TIP (MISCELLANEOUS) ×2 IMPLANT
HOLDER FOLEY CATH W/STRAP (MISCELLANEOUS) ×2 IMPLANT
KIT TURNOVER KIT A (KITS) ×2 IMPLANT
LABEL OR SOLS (LABEL) ×2 IMPLANT
LIGASURE IMPACT 36 18CM CVD LR (INSTRUMENTS) ×2 IMPLANT
NS IRRIG 1000ML POUR BTL (IV SOLUTION) ×6 IMPLANT
NS IRRIG 500ML POUR BTL (IV SOLUTION) ×2 IMPLANT
PACK BASIN MAJOR ARMC (MISCELLANEOUS) ×2 IMPLANT
PACK BASIN MINOR ARMC (MISCELLANEOUS) IMPLANT
PACK COLON CLEAN CLOSURE (MISCELLANEOUS) IMPLANT
PACK LAP CHOLECYSTECTOMY (MISCELLANEOUS) IMPLANT
PENCIL ELECTRO HAND CTR (MISCELLANEOUS) IMPLANT
RETAINER VISCERA MED (MISCELLANEOUS) ×2 IMPLANT
SCALPEL PROTECTED #11 DISP (BLADE) IMPLANT
SEAL FOR SCOPE WARMER C3101 (MISCELLANEOUS) IMPLANT
SET TUBE SMOKE EVAC HIGH FLOW (TUBING) IMPLANT
SET YANKAUER POOLE SUCT (MISCELLANEOUS) IMPLANT
SHEARS HARMONIC 9CM CVD (BLADE) IMPLANT
SHEARS HARMONIC ACE PLUS 36CM (ENDOMECHANICALS) IMPLANT
SOL PREP PVP 2OZ (MISCELLANEOUS)
SOLUTION PREP PVP 2OZ (MISCELLANEOUS) IMPLANT
SPONGE LAP 18X18 RF (DISPOSABLE) ×4 IMPLANT
STAPLE ECHEON FLEX 60 POW ENDO (STAPLE) IMPLANT
STAPLER AUT SUT LDS 15W (STAPLE) IMPLANT
STAPLER CUT CVD 40MM BLUE (STAPLE) ×2 IMPLANT
STAPLER PROXIMATE 75MM BLUE (STAPLE) ×2 IMPLANT
STAPLER SKIN PROX 35W (STAPLE) ×2 IMPLANT
STRIP CLOSURE SKIN 1/2X4 (GAUZE/BANDAGES/DRESSINGS) IMPLANT
SURGILUBE 2OZ TUBE FLIPTOP (MISCELLANEOUS) IMPLANT
SUT ETHILON 4-0 (SUTURE)
SUT ETHILON 4-0 FS2 18XMFL BLK (SUTURE)
SUT NYLON 2-0 (SUTURE) ×2 IMPLANT
SUT PDS AB 1 TP1 54 (SUTURE) IMPLANT
SUT PDS PLUS 0 (SUTURE) ×2
SUT PDS PLUS AB 0 CT-2 (SUTURE) ×2 IMPLANT
SUT PROLENE 0 CT 1 30 (SUTURE) ×2 IMPLANT
SUT PROLENE 0 CT 2 (SUTURE) ×2 IMPLANT
SUT SILK 2 0 (SUTURE)
SUT SILK 2-0 18XBRD TIE 12 (SUTURE) IMPLANT
SUT SILK 3 0 (SUTURE)
SUT SILK 3-0 (SUTURE) ×2 IMPLANT
SUT SILK 3-0 18XBRD TIE 12 (SUTURE) IMPLANT
SUT VIC AB 2-0 BRD 54 (SUTURE) IMPLANT
SUT VIC AB 3-0 SH 27 (SUTURE)
SUT VIC AB 3-0 SH 27X BRD (SUTURE) IMPLANT
SUT VIC AB 4-0 FS2 27 (SUTURE) IMPLANT
SUT VICRYL 3-0 CR8 SH (SUTURE) ×4 IMPLANT
SUT VICRYL+ 3-0 144IN (SUTURE) IMPLANT
SUTURE ETHLN 4-0 FS2 18XMF BLK (SUTURE) IMPLANT
SWABSTK COMLB BENZOIN TINCTURE (MISCELLANEOUS) ×2 IMPLANT
TRAY FOLEY MTR SLVR 16FR STAT (SET/KITS/TRAYS/PACK) ×2 IMPLANT
TROCAR XCEL 12X100 BLDLESS (ENDOMECHANICALS) IMPLANT
TROCAR XCEL NON-BLD 11X100MML (ENDOMECHANICALS) IMPLANT
TUBING EVAC SMOKE HEATED PNEUM (TUBING) IMPLANT

## 2019-11-22 NOTE — Transfer of Care (Signed)
Immediate Anesthesia Transfer of Care Note  Patient: Craig [name]  Procedure(s) Performed: EXPLORATORY LAPAROTOMY (N/A ) PARTIAL COLECTOMY (N/A ) COLOSTOMY (N/A )  Patient Location: PACU  Anesthesia Type:General  Level of Consciousness: sedated  Airway & Oxygen Therapy: Patient Spontanous Breathing and Patient connected to T-piece oxygen  Post-op Assessment: Report given to RN and Post -op Vital signs reviewed and stable  Post vital signs: Reviewed and stable  Last Vitals:  Vitals Value Taken Time  BP 120/89 11/22/19 2059  Temp    Pulse 105 11/22/19 2106  Resp 22 11/22/19 2106  SpO2 99 % 11/22/19 2106  Vitals shown include unvalidated device data.  Last Pain:  Vitals:   11/22/19 1625  TempSrc: Oral  PainSc:          Complications: No apparent anesthesia complications

## 2019-11-22 NOTE — ED Triage Notes (Signed)
Patient states that he had fever started last Sunday.  Patient states that he went to [facility] in [place] on Thursday and was tested for COVID and was Negative.  Patient states that he started an antibiotic on Thursday.  Patient states that he started having abdominal pain and diarrhea that started on Friday.  Patient states that he feels SOB for the past couple of days.

## 2019-11-22 NOTE — Anesthesia Postprocedure Evaluation (Signed)
Anesthesia Post Note  Patient: SHAFI MORGANTE  Procedure(s) Performed: EXPLORATORY LAPAROTOMY (N/A ) PARTIAL COLECTOMY (N/A ) COLOSTOMY (N/A )  Patient location during evaluation: PACU Anesthesia Type: General Level of consciousness: awake and alert Pain management: pain level controlled Vital Signs Assessment: post-procedure vital signs reviewed and stable Respiratory status: spontaneous breathing, nonlabored ventilation, respiratory function stable and patient connected to nasal cannula oxygen Cardiovascular status: blood pressure returned to baseline and stable Postop Assessment: no apparent nausea or vomiting Anesthetic complications: no     Last Vitals:  Vitals:   11/22/19 2130 11/22/19 2145  BP: 112/69 131/65  Pulse: (!) 110 (!) 113  Resp:    Temp:  (!) 36.4 C  SpO2: 92% 92%    Last Pain:  Vitals:   11/22/19 2130  TempSrc:   PainSc: 0-No pain                 Precious Haws Kenyette Gundy

## 2019-11-22 NOTE — Anesthesia Preprocedure Evaluation (Signed)
Anesthesia Evaluation  Patient identified by MRN, date of birth, ID band Patient awake    Reviewed: Allergy & Precautions, H&P , NPO status , Patient's Chart, lab work & pertinent test results  History of Anesthesia Complications Negative for: history of anesthetic complications  Airway Mallampati: III  TM Distance: >3 FB Neck ROM: full    Dental  (+) Chipped   Pulmonary neg shortness of breath, former smoker,           Cardiovascular Exercise Tolerance: Good (-) angina(-) Past MI and (-) DOE negative cardio ROS       Neuro/Psych negative neurological ROS  negative psych ROS   GI/Hepatic negative GI ROS, Neg liver ROS, neg GERD  ,  Endo/Other  negative endocrine ROS  Renal/GU      Musculoskeletal   Abdominal   Peds  Hematology negative hematology ROS (+)   Anesthesia Other Findings Free Air in abdomen from bowel perf  BMI    Body Mass Index: 34.03 kg/m      Reproductive/Obstetrics negative OB ROS                             Anesthesia Physical Anesthesia Plan  ASA: III and emergent  Anesthesia Plan: General ETT   Post-op Pain Management:    Induction: Intravenous  PONV Risk Score and Plan: Ondansetron, Dexamethasone, Midazolam and Treatment may vary due to age or medical condition  Airway Management Planned: Oral ETT  Additional Equipment:   Intra-op Plan:   Post-operative Plan: Extubation in OR  Informed Consent: I have reviewed the patients History and Physical, chart, labs and discussed the procedure including the risks, benefits and alternatives for the proposed anesthesia with the patient or authorized representative who has indicated his/her understanding and acceptance.     Dental Advisory Given  Plan Discussed with: Anesthesiologist, CRNA and Surgeon  Anesthesia Plan Comments: (Patient consented for risks of anesthesia including but not limited to:  -  adverse reactions to medications - damage to teeth, lips or other oral mucosa - sore throat or hoarseness - Damage to heart, brain, lungs or loss of life  Patient voiced understanding.)        Anesthesia Quick Evaluation

## 2019-11-22 NOTE — ED Triage Notes (Signed)
Patient arrived via EMS from [facility]. Patient sent by MD at urgent care due to questionable free air in abdomen. Patient has had abdominal pain for the past 5 days and is acute, patient has had a fever on and off for about 7 days. Patient has elevated temperature, WBC, is Tachycardic. Sepsis orders have been initiated.   Patient is chronic consumer of ETOH. Patient states he has not consumed any ETOH in 3-4 days.

## 2019-11-22 NOTE — ED Provider Notes (Signed)
[facility] Emergency Department Provider Note  ____________________________________________  Time seen: Approximately 11:56 AM  I have reviewed the triage vital signs and the nursing notes.   HISTORY  Chief Complaint Abdominal Pain and SIRS    HPI Craig [name] is a 29 y.o. male who presents the emergency department for complaint of possible free air in the abdomen.  Patient had an experienced lower abdominal pain that he describes as mid to right-sided approximately a week ago.  Patient states that he had abdominal pain and transition into fevers, chills, body aches, nausea.  Patient states that initially he thought he had COVID-19.  After 3 days patient presented to urgent care, was diagnosed with an unspecified infection with a negative Covid test.  Patient was placed on cefdinir.  Patient states that as he was taking cefdinir he became more sick with nausea, vomiting, diarrhea.  Patient states that the abdominal pain was increasing so he presented to another urgent care this morning.  Initial x-rays were concerning for pneumoperitoneum.  Patient was sent to the emergency department for further evaluation and possible surgical management.  Patient has been febrile for 1 week.  Patient has a temperature of 100.2 F here, he is tachycardic, tachypneic.         History reviewed. No pertinent past medical history.  Patient Active Problem List   Diagnosis Date Noted  . Diverticulitis of colon with perforation 11/22/2019    History reviewed. No pertinent surgical history.  Prior to Admission medications   Medication Sig Start Date End Date Taking? Authorizing Provider  acetaminophen (TYLENOL) 500 MG tablet Take 1,000 mg by mouth every 6 (six) hours as needed.   Yes [provider]  Ascorbic Acid (VITAMIN C) 100 MG tablet Take 100 mg by mouth daily.   Yes [provider]  cefdinir (OMNICEF) 300 MG capsule Take 300 mg by mouth 2 (two)  times daily.  11/19/19  Yes [provider]  cholecalciferol (VITAMIN D3) 25 MCG (1000 UNIT) tablet Take 1,000 Units by mouth daily.   Yes [provider]  ibuprofen (ADVIL) 800 MG tablet Take 800 mg by mouth every 8 (eight) hours as needed.   Yes [provider]  zinc gluconate 50 MG tablet Take 50 mg by mouth daily.   Yes [provider]  cyclobenzaprine (FLEXERIL) 10 MG tablet Take 10 mg by mouth 3 (three) times daily as needed for muscle spasms.    [provider]    Allergies Amoxicillin  Family History  Problem Relation Age of Onset  . Healthy Mother   . Healthy Father     Social History Social History   Tobacco Use  . Smoking status: Former Research scientist (life sciences)  . Smokeless tobacco: Current User    Types: Chew  Substance Use Topics  . 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 Drug use: Never     Review of Systems  Constitutional: No fever/chills Eyes: No visual changes. No discharge ENT: No upper respiratory complaints. Cardiovascular: no chest pain. Respiratory: no cough. No SOB. Gastrointestinal: Initially lower to right sided abdominal pain now diffuse abdominal pain.  Positive for nausea, vomiting, diarrhea.  No constipation. Genitourinary: Negative for dysuria. No hematuria Musculoskeletal: Negative for musculoskeletal pain. Skin: Negative for rash, abrasions, lacerations, ecchymosis. Neurological: Negative for headaches, focal weakness or numbness. 10-point ROS otherwise negative.  ____________________________________________   PHYSICAL EXAM:  VITAL SIGNS: ED Triage Vitals  Enc Vitals  Group     BP      Pulse      Resp      Temp      Temp src      SpO2      Weight      Height      Head Circumference      Peak Flow      Pain Score      Pain Loc      Pain Edu?      Excl. in [place]?      Constitutional: Alert and oriented. Well appearing and in no acute distress. Eyes:  Conjunctivae are normal. PERRL. EOMI. Head: Atraumatic. ENT:      Ears:       Nose: No congestion/rhinnorhea.      Mouth/Throat: Mucous membranes are moist.  Neck: No stridor.   Hematological/Lymphatic/Immunilogical: No cervical lymphadenopathy. Cardiovascular: Normal rate, regular rhythm. Normal S1 and S2.  Good peripheral circulation. Respiratory: Normal respiratory effort without tachypnea or retractions. Lungs CTAB. Good air entry to the bases with no decreased or absent breath sounds. Gastrointestinal: No evidence of ecchymosis, erythema or edema.  Some distention identified.  Bowel sounds 4 quadrants.  Soft to palpation but diffusely tender in all quadrants, specifically around the umbilicus...  Mild diffuse guarding.  No rigidity.  No palpable masses.  No CVA tenderness. Musculoskeletal: Full range of motion to all extremities. No gross deformities appreciated. Neurologic:  Normal speech and language. No gross focal neurologic deficits are appreciated.  Skin:  Skin is warm, dry and intact. No rash noted. Psychiatric: Mood and affect are normal. Speech and behavior are normal. Patient exhibits appropriate insight and judgement.   ____________________________________________   LABS (all labs ordered are listed, but only abnormal results are displayed)  Labs Reviewed  CULTURE, BLOOD (ROUTINE X 2)  CULTURE, BLOOD (ROUTINE X 2)  RESPIRATORY PANEL BY RT PCR (FLU A&B, COVID)  PROTIME-INR  LACTIC ACID, PLASMA  HIV ANTIBODY (ROUTINE TESTING W REFLEX)   Status:  Final result Visible to patient:  No (scheduled for 11/22/2019 11:33 AM) Next appt:  None  Ref Range & Units 10:28  WBC 4.0 - 10.5 K/uL 18.7High    RBC 4.22 - 5.81 MIL/uL 4.76   Hemoglobin 13.0 - 17.0 g/dL 15.4   HCT 39.0 - 52.0 % 44.1   MCV 80.0 - 100.0 fL 92.6   MCH 26.0 - 34.0 pg 32.4   MCHC 30.0 - 36.0 g/dL 34.9   RDW 11.5 - 15.5 % 13.0   Platelets 150 - 400 K/uL 255   nRBC 0.0 - 0.2 % 0.0   Neutrophils Relative %  % 84   Neutro Abs 1.7 - 7.7 K/uL 15.7High    Lymphocytes Relative % 7   Lymphs Abs 0.7 - 4.0 K/uL 1.3   Monocytes Relative % 8   Monocytes Absolute 0.1 - 1.0 K/uL 1.6High    Eosinophils Relative % 0   Eosinophils Absolute 0.0 - 0.5 K/uL 0.0   Basophils Relative % 0   Basophils Absolute 0.0 - 0.1 K/uL 0.0   Immature Granulocytes % 1   Abs Immature Granulocytes 0.00 - 0.07 K/uL 0.15High    Comment: Performed at [facility], [address] [place], [place] [address]        Ref Range & Units 10:28  Sodium 135 - 145 mmol/L 132Low    Potassium 3.5 - 5.1 mmol/L 3.5   Chloride 98 - 111 mmol/L 91Low  CO2 22 - 32 mmol/L 26   Glucose, Bld 70 - 99 mg/dL 148High    Comment: Glucose reference range applies only to samples taken after fasting for at least 8 hours.  BUN 6 - 20 mg/dL 17   Creatinine, Ser 0.61 - 1.24 mg/dL 1.04   Calcium 8.9 - 10.3 mg/dL 9.4   Total Protein 6.5 - 8.1 g/dL 8.4High    Albumin 3.5 - 5.0 g/dL 3.6   AST 15 - 41 U/L 24   ALT 0 - 44 U/L 24   Alkaline Phosphatase 38 - 126 U/L 56   Total Bilirubin 0.3 - 1.2 mg/dL 1.6High    GFR calc non Af Amer >60 mL/min >60   GFR calc Af Amer >60 mL/min >60   Anion gap 5 - 15 15     ____________________________________________  EKG   ____________________________________________  RADIOLOGY I personally viewed and evaluated these images as part of my medical decision making, as well as reviewing the written report by the radiologist.  DG Chest 2 View  Result Date: 11/22/2019 CLINICAL DATA:  Fever and shortness of breath EXAM: CHEST - 2 VIEW COMPARISON:  None. FINDINGS: There is atelectatic change in the lung bases, more on the right than on the left. Heart size and pulmonary vascularity are normal. No adenopathy. There is suspected pneumoperitoneum, particularly on the lateral view. IMPRESSION: Concern for potential pneumoperitoneum. Advise left lateral decubitus abdomen radiograph to further evaluate. Bibasilar  atelectasis, more notable on the left than on the right. Lungs elsewhere clear. Cardiac silhouette normal. Critical Value/emergent results were called by telephone at the time of interpretation on 11/22/2019 at 10:10 am to provider Norval Gable , who verbally acknowledged these results. Electronically Signed   By: Lowella Grip III M.D.   On: 11/22/2019 10:10   CT ABDOMEN PELVIS W CONTRAST  Result Date: 11/22/2019 CLINICAL DATA:  Suspected small bowel obstruction and perforation. EXAM: CT ABDOMEN AND PELVIS WITH CONTRAST TECHNIQUE: Multidetector CT imaging of the abdomen and pelvis was performed using the standard protocol following bolus administration of intravenous contrast. CONTRAST:  150mL OMNIPAQUE IOHEXOL 300 MG/ML  SOLN COMPARISON:  Radiograph November 22, 2019 FINDINGS: Lower chest: No acute abnormality. Hepatobiliary: Area of hypoattenuation in the left lobe of the liver measures 3.6 x 3.2 cm. The gallbladder is normal. Pancreas: Unremarkable. No pancreatic ductal dilatation or surrounding inflammatory changes. Spleen: Normal in size without focal abnormality. Adrenals/Urinary Tract: Adrenal glands are unremarkable. Kidneys are normal, without renal calculi, focal lesion, or hydronephrosis. Bladder is unremarkable. Stomach/Bowel: There is eccentric irregular thickening of approximately 10 cm of the sigmoid colon with several scattered diverticula within the area of abnormal colon. An abscess containing free fluid and gas arises from the midportion of the abnormal sigmoid colon and ascends to the mid abdomen measuring approximately 9 x 5.3 x 4.5 cm. There is inflammatory mesenteric stranding and retraction to the site of abscess, which serves as a transitional point for an upstream small bowel obstruction with bowel loops measuring up to 4.7 cm. The downstream small bowel and the remainder of the colon are decompressed. Additionally, there is a complex fluid collection posterior and inferior to the  sigmoid colon. Free fluid and gas extend to perihepatic location tracking under the right hemidiaphragm. Vascular/Lymphatic: No significant vascular findings are present. No enlarged abdominal or pelvic lymph nodes. Reproductive: Prostate is unremarkable. Other: No abdominal wall hernia or abnormality. Musculoskeletal: No acute or significant osseous findings. IMPRESSION: 1. Abnormal asymmetrically thickened sigmoid colon, likely perforated,  with subsequent 9 x 5 x 4 cm abscess extending from the midportion of the abnormal sigmoid colon to the mid abdomen. It is difficult to determine whether this represents a perforated diverticulitis or colonic malignancy. 2. Extensive inflammatory changes in the mid abdomen mesentery with subsequent upstream small bowel obstruction with bowel loops measuring up to 4.7 cm. 3. Free fluid and gas extend to perihepatic location. 4. Area of hypoattenuation in the left lobe of the liver, which may represent focal fatty infiltration, liver mass or an abscess. 5. These results were called by telephone at the time of interpretation on 11/22/2019 at 1:08 pm to provider [facility] , who verbally acknowledged these results. Electronically Signed   By: Fidela Salisbury M.D.   On: 11/22/2019 13:23   DG Abd Decub  Result Date: 11/22/2019 CLINICAL DATA:  Suspected free intraperitoneal air on chest x-ray. EXAM: ABDOMEN - 1 VIEW DECUBITUS COMPARISON:  Chest x-ray same date. FINDINGS: Dilated small bowel loops with air-fluid levels suggesting small bowel obstruction. The decubitus film confirms free intraperitoneal air suggesting hollow viscus perforation. Recommend surgical consultation. Bibasilar atelectasis. IMPRESSION: 1. Free intraperitoneal air suggesting hollow viscus perforation. Recommend surgical consultation and possible CT scan. 2. Dilated small bowel loops with air-fluid levels suggesting small bowel obstruction. These results were called by telephone at the time of  interpretation on 11/22/2019 at 11:05 am to provider Norval Gable , who verbally acknowledged these results. Electronically Signed   By: Marijo Sanes M.D.   On: 11/22/2019 11:05    ____________________________________________    PROCEDURES  Procedure(s) performed:    Procedures    Medications  acetaminophen (TYLENOL) tablet 1,000 mg (has no administration in time range)  enoxaparin (LOVENOX) injection 40 mg (has no administration in time range)  0.9 %  sodium chloride infusion (has no administration in time range)  ciprofloxacin (CIPRO) IVPB 400 mg (has no administration in time range)    And  metroNIDAZOLE (FLAGYL) IVPB 500 mg (has no administration in time range)  acetaminophen (TYLENOL) tablet 650 mg (has no administration in time range)    Or  acetaminophen (TYLENOL) suppository 650 mg (has no administration in time range)  HYDROcodone-acetaminophen (NORCO/VICODIN) 5-325 MG per tablet 1-2 tablet (has no administration in time range)  morphine 4 MG/ML injection 4 mg (has no administration in time range)  ondansetron (ZOFRAN-ODT) disintegrating tablet 4 mg (has no administration in time range)    Or  ondansetron (ZOFRAN) injection 4 mg (has no administration in time range)  pantoprazole (PROTONIX) injection 40 mg (has no administration in time range)  iohexol (OMNIPAQUE) 300 MG/ML solution 100 mL (100 mLs Intravenous Contrast Given 11/22/19 1237)  sodium chloride 0.9 % bolus 1,000 mL (1,000 mLs Intravenous New Bag/Given 11/22/19 1337)  ceFEPIme (MAXIPIME) 2 g in sodium chloride 0.9 % 100 mL IVPB (0 g Intravenous Stopped 11/22/19 1433)     ____________________________________________   INITIAL IMPRESSION / ASSESSMENT AND PLAN / ED COURSE  Pertinent labs & imaging results that were available during my care of the patient were reviewed by me and considered in my medical decision making (see chart for details).  Review of the [place] CSRS was performed in accordance of the [place] prior  to dispensing any controlled drugs.           Patient's diagnosis is consistent with bowel perforation with intra-abdominal abscess, small bowel obstruction, free fluid and free gas in the perihepatic region.  Patient presented to emergency department from urgent care for findings concerning  for pneumoperitoneum.  Patient had an elevated white blood cell count of 19,000.  Initial lactic was only 1.3.  However patient was febrile, tachycardic, tachypneic.  Patient was given Flagyl, cefepime as he is allergic to amoxicillin.  Patient was also given fluid for likely sepsis.  Imaging reveals that patient has a large abscess originating off of the sigmoid colon.  It does appear that there is bowel perforation with free fluid and gas up to the level of the perihepatic region.  Patient has diffuse inflammation throughout the small intestines with dilation of the bowel loops and SBO.  Given these findings, surgery was consulted.  Surgery accepts the patient and will take the patient to the OR this afternoon.  Patient has received antibiotics, fluids, Tylenol here in the emergency department.  Patient has been held n.p.o. since arrival.  Patient care will be transferred to surgery team at this time.     ____________________________________________  FINAL CLINICAL IMPRESSION(S) / ED DIAGNOSES  Final diagnoses:  Bowel perforation (HCC)  SBO (small bowel obstruction) ([place])  Intra-abdominal abscess ([place])      NEW MEDICATIONS STARTED DURING THIS VISIT:  ED Discharge Orders    None          This chart was dictated using voice recognition software/Dragon. Despite best efforts to proofread, errors can occur which can change the meaning. Any change was purely unintentional.    Darletta Moll, PA-C 11/22/19 1446    Lilia Pro., MD 11/23/19 1215

## 2019-11-22 NOTE — ED Provider Notes (Addendum)
[facility]    CSN: [id number] Arrival date & time: 11/22/19  0920      History   Chief Complaint Chief Complaint  Patient presents with  . Fever    HPI Craig [name] is a 29 y.o. male.   29 yo male with a c/o fevers for the past week and now with diarrhea, abdominal cramps and shortness of breath for the past 2-3 days. States he was seen at [facility] in [place] 4 days ago and had a negative rapid covid test. Was given antibiotic, however patient has not improved. States abdominal pain has worsened and has not able to keep much down by mouth.  States he had a bowel movement this morning. Denies any melena, hematochezia, hematemesis.    Fever   History reviewed. No pertinent past medical history.  There are no problems to display for this patient.   History reviewed. No pertinent surgical history.     Home Medications    Prior to Admission medications   Medication Sig Start Date End Date Taking? Authorizing Provider  cefdinir (OMNICEF) 300 MG capsule  11/19/19  Yes [provider]    Family History Family History  Problem Relation Age of Onset  . Healthy Mother   . Healthy Father     Social History Social History   Tobacco Use  . Smoking status: Former Research scientist (life sciences)  . Smokeless tobacco: Current User    Types: Chew  Substance Use Topics  . Alcohol use: Yes  . Drug use: Never     Allergies   Amoxicillin   Review of Systems Review of Systems  Constitutional: Positive for fever.     Physical Exam Triage Vital Signs ED Triage Vitals  Enc Vitals Group     BP 11/22/19 0937 (!) 139/91     Pulse Rate 11/22/19 0937 (!) 142     Resp 11/22/19 0937 18     Temp 11/22/19 0937 99.6 F (37.6 C)     Temp Source 11/22/19 0937 Oral     SpO2 11/22/19 0937 96 %     Weight 11/22/19 0935 260 lb (117.9 kg)     Height 11/22/19 0935 6\' 1"  (1.854 m)     Head Circumference --      Peak Flow --      Pain Score 11/22/19 0934 5     Pain  Loc --      Pain Edu? --      Excl. in [place]? --    No data found.  Updated Vital Signs BP (!) 139/91 (BP Location: Left Arm)   Pulse (!) 142   Temp 99.6 F (37.6 C) (Oral)   Resp 18   Ht 6\' 1"  (1.854 m)   Wt 117.9 kg   SpO2 96%   BMI 34.30 kg/m   Visual Acuity Right Eye Distance:   Left Eye Distance:   Bilateral Distance:    Right Eye Near:   Left Eye Near:    Bilateral Near:     Physical Exam Vitals and nursing note reviewed.  Constitutional:      General: He is not in acute distress.    Appearance: He is not toxic-appearing or diaphoretic.  Cardiovascular:     Rate and Rhythm: Tachycardia present.  Abdominal:     General: Bowel sounds are decreased. There is distension.     Palpations: Abdomen is soft. There is no mass.     Tenderness: There is abdominal tenderness. There is no right  CVA tenderness, left CVA tenderness, guarding or rebound.     Hernia: No hernia is present.     Comments: Hypoactive, tympanic bowel sounds  Neurological:     Mental Status: He is alert.      UC Treatments / Results  Labs (all labs ordered are listed, but only abnormal results are displayed) Labs Reviewed  COMPREHENSIVE METABOLIC PANEL - Abnormal; Notable for the following components:      Result Value   Sodium 132 (*)    Chloride 91 (*)    Glucose, Bld 148 (*)    Total Protein 8.4 (*)    Total Bilirubin 1.6 (*)    All other components within normal limits  CBC WITH DIFFERENTIAL/PLATELET - Abnormal; Notable for the following components:   WBC 18.7 (*)    Neutro Abs 15.7 (*)    Monocytes Absolute 1.6 (*)    Abs Immature Granulocytes 0.15 (*)    All other components within normal limits  SARS CORONAVIRUS 2 (TAT 6-24 HRS)    EKG   Radiology DG Chest 2 View  Result Date: 11/22/2019 CLINICAL DATA:  Fever and shortness of breath EXAM: CHEST - 2 VIEW COMPARISON:  None. FINDINGS: There is atelectatic change in the lung bases, more on the right than on the left. Heart size  and pulmonary vascularity are normal. No adenopathy. There is suspected pneumoperitoneum, particularly on the lateral view. IMPRESSION: Concern for potential pneumoperitoneum. Advise left lateral decubitus abdomen radiograph to further evaluate. Bibasilar atelectasis, more notable on the left than on the right. Lungs elsewhere clear. Cardiac silhouette normal. Critical Value/emergent results were called by telephone at the time of interpretation on 11/22/2019 at 10:10 am to provider Norval Gable , who verbally acknowledged these results. Electronically Signed   By: Lowella Grip III M.D.   On: 11/22/2019 10:10   DG Abd Decub  Result Date: 11/22/2019 CLINICAL DATA:  Suspected free intraperitoneal air on chest x-ray. EXAM: ABDOMEN - 1 VIEW DECUBITUS COMPARISON:  Chest x-ray same date. FINDINGS: Dilated small bowel loops with air-fluid levels suggesting small bowel obstruction. The decubitus film confirms free intraperitoneal air suggesting hollow viscus perforation. Recommend surgical consultation. Bibasilar atelectasis. IMPRESSION: 1. Free intraperitoneal air suggesting hollow viscus perforation. Recommend surgical consultation and possible CT scan. 2. Dilated small bowel loops with air-fluid levels suggesting small bowel obstruction. These results were called by telephone at the time of interpretation on 11/22/2019 at 11:05 am to provider Norval Gable , who verbally acknowledged these results. Electronically Signed   By: Marijo Sanes M.D.   On: 11/22/2019 11:05    Procedures ED EKG  Date/Time: 11/22/2019 9:50 AM Performed by: Norval Gable, MD Authorized by: Norval Gable, MD   ECG reviewed by ED Physician in the absence of a cardiologist: yes   Previous ECG:    Previous ECG:  Unavailable Interpretation:    Interpretation: abnormal   Rate:    ECG rate:  128   ECG rate assessment: tachycardic   Rhythm:    Rhythm: sinus tachycardia   Ectopy:    Ectopy: none   QRS:    QRS axis:  Normal    QRS intervals:  Normal Conduction:    Conduction: normal   ST segments:    ST segments:  Normal T waves:    T waves: normal     (including critical care time)  Medications Ordered in UC Medications  0.9 %  sodium chloride infusion ( Intravenous New Bag/Given 11/22/19 1109)    Initial Impression /  Assessment and Plan / UC Course  I have reviewed the triage vital signs and the nursing notes.  Pertinent labs & imaging results that were available during my care of the patient were reviewed by me and considered in my medical decision making (see chart for details).      Final Clinical Impressions(s) / UC Diagnoses   Final diagnoses:  Pain  Pneumoperitoneum of unknown etiology  Pain of upper abdomen  Partial small bowel obstruction (HCC)  Small bowel obstruction [facility])    ED Prescriptions    None      1. Abnormal labs/x-ray results and diagnosis reviewed with patient and spouse, including explanation of possible life threatening condition; IV NS fluids started; patient requiring higher level of care; recommend patient go to Emergency Department by EMS for further evaluation/management, possible hospitalization/surgery.  Patient verbalizes understanding, is in stable condition and will proceed by EMS. Report called to charge ED RN at [facility].   PDMP not reviewed this encounter.   Norval Gable, MD 11/22/19 [place], [place], MD 11/22/19 [place], [place], MD 11/22/19 1125

## 2019-11-22 NOTE — Progress Notes (Addendum)
Pt brought to PACU awake, A&O.  NG intact, connected to low suction. Abdomen with honeycomb dsg intact.  JP drains x 2, along with pinrose drain distal to dsg open to drain on abd pain. Foley intact and draining amber urine.  Pt denies any pain. 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

## 2019-11-22 NOTE — H&P (Addendum)
SURGICAL CONSULTATION NOTE   HISTORY OF PRESENT ILLNESS (HPI):  29 y.o. male presented to [facility] ED for evaluation of abdominal pain since a week ago. Patient reports he decided to go to an urgent care due to the abdominal pain and difficulty breathing 4 days ago.  Rapid culture was done and he was discharged with antibiotic therapy.  He reports that the pain continued to progress and get worse.  He came in today to the ED and he was found with generalized abdominal pain.  There is no pain radiation.  Aggravating factor is palpating the abdomen and certain movement of the abdominal wall.  There is no alleviating factors.  He also endorses having fevers at home.  He does have nausea.  At the ED he was found with leukocytosis.  CT scan of the abdominal pelvis was done showing thickening of the sigmoid colon with associated large abscess and abundant amount of free air.  I personally evaluate the images.  Surgery is consulted by Dr. Royden Purl in this context for evaluation and management of intestinal perforation.  PAST MEDICAL HISTORY (PMH):  History reviewed. No pertinent past medical history.   PAST SURGICAL HISTORY ([place]):  History reviewed. No pertinent surgical history.   MEDICATIONS:  Prior to Admission medications   Medication Sig Start Date End Date Taking? Authorizing Provider  acetaminophen (TYLENOL) 500 MG tablet Take 1,000 mg by mouth every 6 (six) hours as needed.   Yes [provider]  Ascorbic Acid (VITAMIN C) 100 MG tablet Take 100 mg by mouth daily.   Yes [provider]  cefdinir (OMNICEF) 300 MG capsule Take 300 mg by mouth 2 (two) times daily.  11/19/19  Yes [provider]  cholecalciferol (VITAMIN D3) 25 MCG (1000 UNIT) tablet Take 1,000 Units by mouth daily.   Yes [provider]  ibuprofen (ADVIL) 800 MG tablet Take 800 mg by mouth every 8 (eight) hours as needed.   Yes [provider]  zinc gluconate 50 MG tablet Take 50 mg by mouth  daily.   Yes [provider]  cyclobenzaprine (FLEXERIL) 10 MG tablet Take 10 mg by mouth 3 (three) times daily as needed for muscle spasms.    [provider]     ALLERGIES:  Allergies  Allergen Reactions  . Amoxicillin Rash     SOCIAL HISTORY:  Social History   Socioeconomic History  . Marital status: Single    Spouse name: Not on file  . Number of children: Not on file  . Years of education: Not on file  . Highest education level: Not on file  Occupational History  . Not on file  Tobacco Use  . Smoking status: Former Research scientist (life sciences)  . Smokeless tobacco: Current User    Types: Chew  Substance and Sexual Activity  . 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 Drug use: Never  . Sexual activity: Yes    Birth control/protection: None  Other Topics Concern  . Not on file  Social History Narrative  . Not on file   Social Determinants of Health   Financial Resource Strain:   . Difficulty of Paying Living Expenses:   Food Insecurity:   . Worried About Charity fundraiser in the Last Year:   . Arboriculturist in the Last Year:   Transportation Needs:   . Film/video editor (Medical):   Marland Kitchen Lack of Transportation (Non-Medical):   Physical Activity:   .  Days of Exercise per Week:   . Minutes of Exercise per Session:   Stress:   . Feeling of Stress :   Social Connections:   . Frequency of Communication with Friends and Family:   . Frequency of Social Gatherings with Friends and Family:   . Attends Religious Services:   . Active Member of Clubs or Organizations:   . Attends Archivist Meetings:   Marland Kitchen Marital Status:   Intimate Partner Violence:   . Fear of Current or Ex-Partner:   . Emotionally Abused:   Marland Kitchen Physically Abused:   . Sexually Abused:       FAMILY HISTORY:  Family History  Problem Relation Age of Onset  . Healthy Mother   . Healthy Father      REVIEW OF SYSTEMS:  Constitutional: denies  weight loss, positive for fever, chills, sweats  Eyes: denies any other vision changes, history of eye injury  ENT: denies sore throat, hearing problems  Respiratory: denies shortness of breath, wheezing  Cardiovascular: denies chest pain, palpitations  Gastrointestinal: abdominal pain, nausea and vomiting Genitourinary: denies burning with urination or urinary frequency Musculoskeletal: denies any other joint pains or cramps  Skin: denies any other rashes or skin discolorations  Neurological: denies any other headache, dizziness, weakness  Psychiatric: denies any other depression, anxiety   All other review of systems were negative   VITAL SIGNS:  Temp:  [99.1 F (37.3 C)-101.2 F (38.4 C)] 99.1 F (37.3 C) (04/04 1450) Pulse Rate:  [119-142] 120 (04/04 1450) Resp:  [18-28] 28 (04/04 1450) BP: (129-154)/(72-91) 143/76 (04/04 1450) SpO2:  [90 %-96 %] 91 % (04/04 1450) Weight:  [117 kg-117.9 kg] 117 kg (04/04 1200)     Height: 6\' 1"  (185.4 cm) Weight: 117 kg BMI (Calculated): 34.04   INTAKE/OUTPUT:  This shift: Total I/O In: 100 [IV Piggyback:100] Out: -   Last 2 shifts: @IOLAST2SHIFTS @   PHYSICAL EXAM:  Constitutional:  -- Normal body habitus  -- Awake, alert, and oriented x3  Eyes:  -- Pupils equally round and reactive to light  -- No scleral icterus  Ear, nose, and throat:  -- No jugular venous distension  Pulmonary:  -- No crackles  -- Equal breath sounds bilaterally -- Breathing non-labored at rest Cardiovascular:  -- S1, S2 present  -- No pericardial rubs Gastrointestinal:  -- Abdomen soft, tender to palpation in all quadrants, distended, positive guarding and rebound tenderness -- No abdominal masses appreciated, pulsatile or otherwise  Musculoskeletal and Integumentary:  -- Wounds: None appreciated -- Extremities: B/L UE and LE FROM, hands and feet warm, no edema  Neurologic:  -- Motor function: intact and symmetric -- Sensation: intact and  symmetric   Labs:  CBC Latest Ref Rng & Units 11/22/2019  WBC 4.0 - 10.5 K/uL 18.7(H)  Hemoglobin 13.0 - 17.0 g/dL 15.4  Hematocrit 39.0 - 52.0 % 44.1  Platelets 150 - 400 K/uL 255   CMP Latest Ref Rng & Units 11/22/2019  Glucose 70 - 99 mg/dL 148(H)  BUN 6 - 20 mg/dL 17  Creatinine 0.61 - 1.24 mg/dL 1.04  Sodium 135 - 145 mmol/L 132(L)  Potassium 3.5 - 5.1 mmol/L 3.5  Chloride 98 - 111 mmol/L 91(L)  CO2 22 - 32 mmol/L 26  Calcium 8.9 - 10.3 mg/dL 9.4  Total Protein 6.5 - 8.1 g/dL 8.4(H)  Total Bilirubin 0.3 - 1.2 mg/dL 1.6(H)  Alkaline Phos 38 - 126 U/L 56  AST 15 - 41 U/L 24  ALT 0 -  44 U/L 24   Imaging studies:  EXAM: CT ABDOMEN AND PELVIS WITH CONTRAST  TECHNIQUE: Multidetector CT imaging of the abdomen and pelvis was performed using the standard protocol following bolus administration of intravenous contrast.  CONTRAST:  193mL OMNIPAQUE IOHEXOL 300 MG/ML  SOLN  COMPARISON:  Radiograph November 22, 2019  FINDINGS: Lower chest: No acute abnormality.  Hepatobiliary: Area of hypoattenuation in the left lobe of the liver measures 3.6 x 3.2 cm. The gallbladder is normal.  Pancreas: Unremarkable. No pancreatic ductal dilatation or surrounding inflammatory changes.  Spleen: Normal in size without focal abnormality.  Adrenals/Urinary Tract: Adrenal glands are unremarkable. Kidneys are normal, without renal calculi, focal lesion, or hydronephrosis. Bladder is unremarkable.  Stomach/Bowel: There is eccentric irregular thickening of approximately 10 cm of the sigmoid colon with several scattered diverticula within the area of abnormal colon. An abscess containing free fluid and gas arises from the midportion of the abnormal sigmoid colon and ascends to the mid abdomen measuring approximately 9 x 5.3 x 4.5 cm. There is inflammatory mesenteric stranding and retraction to the site of abscess, which serves as a transitional point for an upstream small bowel  obstruction with bowel loops measuring up to 4.7 cm. The downstream small bowel and the remainder of the colon are decompressed. Additionally, there is a complex fluid collection posterior and inferior to the sigmoid colon. Free fluid and gas extend to perihepatic location tracking under the right hemidiaphragm.  Vascular/Lymphatic: No significant vascular findings are present. No enlarged abdominal or pelvic lymph nodes.  Reproductive: Prostate is unremarkable.  Other: No abdominal wall hernia or abnormality.  Musculoskeletal: No acute or significant osseous findings.  IMPRESSION: 1. Abnormal asymmetrically thickened sigmoid colon, likely perforated, with subsequent 9 x 5 x 4 cm abscess extending from the midportion of the abnormal sigmoid colon to the mid abdomen. It is difficult to determine whether this represents a perforated diverticulitis or colonic malignancy. 2. Extensive inflammatory changes in the mid abdomen mesentery with subsequent upstream small bowel obstruction with bowel loops measuring up to 4.7 cm. 3. Free fluid and gas extend to perihepatic location. 4. Area of hypoattenuation in the left lobe of the liver, which may represent focal fatty infiltration, liver mass or an abscess. 5. These results were called by telephone at the time of interpretation on 11/22/2019 at 1:08 pm to provider [facility] , who verbally acknowledged these results.   Electronically Signed   By: Fidela Salisbury M.D.   On: 11/22/2019 13:23  Assessment/Plan:  29 y.o. male with acute diverticulitis with perforation.  Patient will prefer diverticulitis.  Unknown timing of when the perforation have been.  May be since initial presentation at the ED with severe abdominal pain and shortness of breath.  The size of the abscess is more likely that it did not happen today.  On physical exam patient with peritoneal signs, fever, leukocytosis.  My recommendation is to proceed  with exploratory laparotomy and partial colectomy of the sigmoid colon and creation of end colostomy with Hartman's pouch of the rectal stump.  I discussed the recommendation with the patient and his wife.  I oriented the patient about the need of a colostomy most likely temporarily for at least 6 months.  I discussed with the patient the risk of injury to bowel, recurring abscesses, injury to ureter, need of further surgeries, bleeding, infections among others.  Patient understood and agreed to proceed.  We will schedule patient to our as soon as possible.  Arnold Long,  MD

## 2019-11-22 NOTE — Anesthesia Procedure Notes (Signed)
Procedure Name: Intubation Date/Time: 11/22/2019 5:40 PM Performed by: Justus Memory, CRNA Pre-anesthesia Checklist: Patient identified, Patient being monitored, Timeout performed, Emergency Drugs available and Suction available Patient Re-evaluated:Patient Re-evaluated prior to induction Oxygen Delivery Method: Circle system utilized Preoxygenation: Pre-oxygenation with 100% oxygen Induction Type: IV induction and Rapid sequence Laryngoscope Size: 3 and Glidescope Grade View: Grade I Tube type: Oral Tube size: 7.0 mm Number of attempts: 1 Airway Equipment and Method: Stylet and Video-laryngoscopy Placement Confirmation: ETT inserted through vocal cords under direct vision,  positive ETCO2,  breath sounds checked- equal and bilateral and CO2 detector Secured at: 21 cm Tube secured with: Tape Dental Injury: Teeth and Oropharynx as per pre-operative assessment

## 2019-11-22 NOTE — Op Note (Signed)
Preoperative diagnosis: Diverticular disease with perforation.  Postoperative diagnosis: Diverticular disease with perforation.  Procedure: Sigmoid colon resection with colostomy (Hartmann's procedure).   Anesthesia: GETA  Surgeon: Dr. Windell Moment, MD  Wound Classification: Dirty  Indications:  Patient is a 29 y.o. male with abdominal pain, fever was found to have an acute perforation of the sigmoid colon. Emergent resection was indicated.  Description of procedure:  The patient was placed in the supine position and general endotracheal anesthesia was induced. A time-out was completed verifying correct patient, procedure, site, positioning, and implant(s) and/or special equipment prior to beginning this procedure. Preoperative antibiotics were given. A Foley catheter and nasogastric tube were placed. The abdomen was prepped and draped in the usual sterile fashion. A vertical midline incision was made from supra umbilical area to just above the pubis. This was deepened through the subcutaneous tissues and hemostasis was achieved with electrocautery. The linea alba was identified and incised and the peritoneal cavity entered. The abdomen was explored.  Generalized peritonitis was found with abundant amount of purulence in all quadrant.   The small bowel was inspected. Multiple interloop abscess were drained. Then small bowel was retracted to the right using a moist towel and self-retaining retractor. Using electrocautery, the colon was freed from its peritoneal attachments along the line of Toldt proximally from the splenic flexure and distally to the pelvic inlet. Both ureters were identified and protected. Omentum was freed from the transverse colon and the splenic flexure was mobilized.  Points of transection were selected proximally and distally. The bowel was divided with the linear cutting stapler. The peritoneum overlying the mesentery was then scored with electrocautery. The peritoneum  overlying the mesentery was scored and remaining mesentery divided with LigaSure device. The specimen was removed, proximal and distal ends tagged, and sent to pathology. The abdominal cavity was then copiously irrigated and hemostasis was checked.  The proximal colon reached easily to the proposed colostomy site without tension. A disk of skin was removed from the colostomy site in the left lower quadrant. The incision was deepened through all layers of the abdominal wall and dilated to admit two fingers. The colon was passed out through the ostomy site without torsion or tension.    The Hartmann's pouch was tagged with two long sutures of 2-0 prolene and allowed to fall into the pelvis.  A closed suction drain was placed in the pelvis and brought out through separate stab wounds lateral to the incision. Another close drain was left in the right upper quadrant. These were secured with 3-0 nylon. Abdominal cavity irrigated with 5 Liter of warm saline.  The fascia was closed with a running suture of PDS 0. The skin was closed with skin staples.  The colostomy was matured with multiple interrupted sutures of 3-0 Vicryl. An ostomy bag was applied.  The patient tolerated the procedure well and was taken to the postanesthesia care unit in stable condition.   Specimen: Sigmoid colon  Complications: None  EBL: 100 mL

## 2019-11-22 NOTE — Progress Notes (Signed)
CODE SEPSIS - PHARMACY COMMUNICATION  **Broad Spectrum Antibiotics should be administered within 1 hour of Sepsis diagnosis**  Time Code Sepsis Called/Page Received: 1345  Antibiotics Ordered: cefepime + metronidazole  Time of 1st antibiotic administration: 1346 (cefepime)  Additional action taken by pharmacy: [place] Resident 11/22/2019  2:13 PM

## 2019-11-22 NOTE — Plan of Care (Signed)
Continuing with plan of care. 

## 2019-11-23 LAB — CBC
HCT: 42.7 % (ref 39.0–52.0)
Hemoglobin: 14.6 g/dL (ref 13.0–17.0)
MCH: 32.7 pg (ref 26.0–34.0)
MCHC: 34.2 g/dL (ref 30.0–36.0)
MCV: 95.7 fL (ref 80.0–100.0)
Platelets: 221 10*3/uL (ref 150–400)
RBC: 4.46 MIL/uL (ref 4.22–5.81)
RDW: 13.1 % (ref 11.5–15.5)
WBC: 16.5 10*3/uL — ABNORMAL HIGH (ref 4.0–10.5)
nRBC: 0 % (ref 0.0–0.2)

## 2019-11-23 LAB — MAGNESIUM: Magnesium: 2.1 mg/dL (ref 1.7–2.4)

## 2019-11-23 LAB — BASIC METABOLIC PANEL
Anion gap: 12 (ref 5–15)
BUN: 12 mg/dL (ref 6–20)
CO2: 25 mmol/L (ref 22–32)
Calcium: 7.7 mg/dL — ABNORMAL LOW (ref 8.9–10.3)
Chloride: 99 mmol/L (ref 98–111)
Creatinine, Ser: 0.74 mg/dL (ref 0.61–1.24)
GFR calc Af Amer: >60 mL/min (ref >60)
GFR calc non Af Amer: >60 mL/min (ref >60)
Glucose, Bld: 144 mg/dL — ABNORMAL HIGH (ref 70–99)
Potassium: 3.4 mmol/L — ABNORMAL LOW (ref 3.5–5.1)
Sodium: 136 mmol/L (ref 135–145)

## 2019-11-23 LAB — PHOSPHORUS: Phosphorus: 2.7 mg/dL (ref 2.5–4.6)

## 2019-11-23 MED ORDER — MENTHOL 3 MG MT LOZG
1.0000 | LOZENGE | OROMUCOSAL | Status: DC | PRN
  Administered 2019-11-24: 3 mg via ORAL
  Filled 2019-11-23: qty 9

## 2019-11-23 MED ORDER — CHLORHEXIDINE GLUCONATE CLOTH 2 % EX PADS
6.0000 | MEDICATED_PAD | Freq: Every day | CUTANEOUS | Status: DC
  Administered 2019-11-23 – 2019-11-25 (×2): 6 via TOPICAL

## 2019-11-23 MED ORDER — PHENOL 1.4 % MT LIQD
1.0000 | OROMUCOSAL | Status: DC | PRN
  Administered 2019-11-23: 1 via OROMUCOSAL
  Filled 2019-11-23 (×2): qty 177

## 2019-11-23 NOTE — ED Provider Notes (Signed)
.  Critical Care Performed by: Lilia Pro., MD Authorized by: Lilia Pro., MD   Critical care provider statement:    Critical care time (minutes):  35   Critical care was necessary to treat or prevent imminent or life-threatening deterioration of the following conditions:  Sepsis (perforated viscus)   Critical care was time spent personally by me on the following activities:  Discussions with consultants, evaluation of patient's response to treatment, examination of patient, ordering and performing treatments and interventions, ordering and review of laboratory studies, ordering and review of radiographic studies, pulse oximetry, re-evaluation of patient's condition, obtaining history from patient or surrogate and review of old charts      Lilia Pro., MD 11/23/19 1216

## 2019-11-23 NOTE — Progress Notes (Signed)
[facility] Hospital Day(s): 1.   Post op day(s): 1 Day Post-Op.   Interval History: Patient seen and examined, no acute events or new complaints overnight. Patient reports feeling okay.  He has expected postoperative pain but not out of proportion.  Slowly controlling the pain with current pain medication.  Denies nausea or vomiting.  There has been no fever or chills  Vital signs in last 24 hours: [min-max] current  Temp:  [97.1 F (36.2 C)-101.2 F (38.4 C)] 98.7 F (37.1 C) (04/05 0402) Pulse Rate:  [104-132] 122 (04/05 0402) Resp:  [16-30] 20 (04/05 0402) BP: (109-154)/(58-91) 132/82 (04/05 0402) SpO2:  [90 %-100 %] 94 % (04/05 0402) Weight:  [id number] kg] 117 kg (04/04 1200)     Height: 6\' 1"  (185.4 cm) Weight: 117 kg BMI (Calculated): 34.04   Nasogastric tube: Charted as 50 cc but it is very bilious.  Physical Exam:  Constitutional: alert, cooperative and no distress  Respiratory: breathing non-labored at rest  Cardiovascular: regular rate and sinus rhythm  Gastrointestinal: soft, non-tender, and non-distended.  Ostomy with dark mucosa, patent.  Labs:  CBC Latest Ref Rng & Units 11/23/2019 11/22/2019  WBC 4.0 - 10.5 K/uL 16.5(H) 18.7(H)  Hemoglobin 13.0 - 17.0 g/dL 14.6 15.4  Hematocrit 39.0 - 52.0 % 42.7 44.1  Platelets 150 - 400 K/uL 221 255   CMP Latest Ref Rng & Units 11/23/2019 11/22/2019  Glucose 70 - 99 mg/dL 144(H) 148(H)  BUN 6 - 20 mg/dL 12 17  Creatinine 0.61 - 1.24 mg/dL 0.74 1.04  Sodium 135 - 145 mmol/L 136 132(L)  Potassium 3.5 - 5.1 mmol/L 3.4(L) 3.5  Chloride 98 - 111 mmol/L 99 91(L)  CO2 22 - 32 mmol/L 25 26  Calcium 8.9 - 10.3 mg/dL 7.7(L) 9.4  Total Protein 6.5 - 8.1 g/dL - 8.4(H)  Total Bilirubin 0.3 - 1.2 mg/dL - 1.6(H)  Alkaline Phos 38 - 126 U/L - 56  AST 15 - 41 U/L - 24  ALT 0 - 44 U/L - 24    Imaging studies: No new pertinent imaging studies   Assessment/Plan:  29 y.o. male with perforated diverticulitis 1 Day Post-Op s/p  Hartman's procedure.  Patient with expected postop recovery.  Still with tachycardia.  We will continue aggressive IV hydration.  White blood cell slowly trending down.  There has been decreasing temperature.  I will expect positive productive ileus.  That is why I left the NGT in place.  We will continue with pain medications.  We will continue with DVT prophylaxis.  Encourage the patient to ambulate.  We will leave Foley for 1 more day to make sure that he has adequate urine output while we will continue to keep pain on high rate of the IV fluids.  We will continue with IV antibiotic therapy.  Arnold Long, MD

## 2019-11-23 NOTE — Progress Notes (Signed)
Dr Peyton Najjar aware of patients pulse rate, acknowledged.

## 2019-11-24 LAB — SURGICAL PATHOLOGY

## 2019-11-24 MED ORDER — COLCHICINE 0.6 MG PO TABS
0.6000 mg | ORAL_TABLET | Freq: Every day | ORAL | Status: DC
  Administered 2019-11-25: 0.6 mg via ORAL
  Filled 2019-11-24: qty 1

## 2019-11-24 MED ORDER — POLYETHYLENE GLYCOL 3350 17 G PO PACK
17.0000 g | PACK | Freq: Every day | ORAL | Status: DC
  Administered 2019-11-24 – 2019-11-29 (×6): 17 g via ORAL
  Filled 2019-11-24 (×6): qty 1

## 2019-11-24 MED ORDER — COLCHICINE 0.6 MG PO TABS
0.6000 mg | ORAL_TABLET | Freq: Once | ORAL | Status: AC
  Administered 2019-11-24: 0.6 mg via ORAL
  Filled 2019-11-24: qty 1

## 2019-11-24 MED ORDER — COLCHICINE 0.6 MG PO TABS
1.2000 mg | ORAL_TABLET | Freq: Once | ORAL | Status: AC
  Administered 2019-11-24: 1.2 mg via ORAL
  Filled 2019-11-24: qty 2

## 2019-11-24 NOTE — Consult Note (Addendum)
[facility] Nurse ostomy consult note Stoma type/location:  Pt had colostomy surgery performed 4/4.  Pt has an NG and is feeling poorly.  He requests that he not have to look at the stoma yet.  Stomal assessment/size: Stoma is red and viable, 1 3/4 inches and oval, slightly above skin level, 100%  dark red old blood  Peristomal assessment: intact skin surrounding Output: No stool or flatus, scant amt blood-tinged drainage in the pouch  Ostomy pouching: 2pc.  Education provided:  Wife at bedside during teaching session.  Pt and wife asked appropriate questions. Demonstrated pouch change using 2 piece pouching system and barrier ring to attempt to maintain a seal.  Reviewed pouching routines and ordering supplies. Wife was able to open and close to empty.  Pt states his "gout is acting up and he was unable to use his hands to empty today."  Educational materials left in the room and 5 sets of barrier rings, wafers, and pouches left at the bedside.  Wife requests to be present for the next teaching visit.  Enrolled patient in [place] program: Yes [place] team members will continue to follow while in the hospital for further educational sessions. Julien Girt MSN, RN, [place], [place], [place]

## 2019-11-24 NOTE — Progress Notes (Signed)
[facility] Hospital Day(s): 2.   Post op day(s): 2 Days Post-Op.   Interval History: Patient seen and examined, no acute events or new complaints overnight. Patient reports feeling better from surgery standpoint. Patient is does complaining and ankle and elbow pain compatible with his pain of gout. Denies fever or chills. Denies nausea. Pain slowly improving.    Vital signs in last 24 hours: [min-max] current  Temp:  [98.6 F (37 C)-100 F (37.8 C)] 99.1 F (37.3 C) (04/06 2008) Pulse Rate:  [108-124] 118 (04/06 1738) Resp:  [20-24] 24 (04/06 1738) BP: (149-157)/(80-99) 150/96 (04/06 1738) SpO2:  [90 %-96 %] 91 % (04/06 1738)     Height: 6\' 1"  (185.4 cm) Weight: 117 kg BMI (Calculated): 34.04   NGT:  450 mL (bilious)  Physical Exam:  Constitutional: alert, cooperative and no distress  Respiratory: breathing non-labored at rest  Cardiovascular: regular rate and sinus rhythm  Gastrointestinal: soft, non-tender, and distended. Wound dry and clean. With serous drainage from inferior open area.   Labs:  CBC Latest Ref Rng & Units 11/23/2019 11/22/2019  WBC 4.0 - 10.5 K/uL 16.5(H) 18.7(H)  Hemoglobin 13.0 - 17.0 g/dL 14.6 15.4  Hematocrit 39.0 - 52.0 % 42.7 44.1  Platelets 150 - 400 K/uL 221 255   CMP Latest Ref Rng & Units 11/23/2019 11/22/2019  Glucose 70 - 99 mg/dL 144(H) 148(H)  BUN 6 - 20 mg/dL 12 17  Creatinine 0.61 - 1.24 mg/dL 0.74 1.04  Sodium 135 - 145 mmol/L 136 132(L)  Potassium 3.5 - 5.1 mmol/L 3.4(L) 3.5  Chloride 98 - 111 mmol/L 99 91(L)  CO2 22 - 32 mmol/L 25 26  Calcium 8.9 - 10.3 mg/dL 7.7(L) 9.4  Total Protein 6.5 - 8.1 g/dL - 8.4(H)  Total Bilirubin 0.3 - 1.2 mg/dL - 1.6(H)  Alkaline Phos 38 - 126 U/L - 56  AST 15 - 41 U/L - 24  ALT 0 - 44 U/L - 24    Imaging studies: No new pertinent imaging studies   Assessment/Plan:  29 y.o. male with perforated diverticulitis 2 Day Post-Op s/p Hartman's procedure.  Patient recovering slowly. Still  without bowel function. Ostomy looking with better color today. Still with tachycardia and fever. Drains are serous.   Had low grade fever today. Will continue with IV abx therapy.   Now with gout flare up. Started on Colchicine today and responding well.   Will continue NPO, NGT. Patient on DVT prophylaxis. Encourage to ambulate.   Arnold Long, MD

## 2019-11-24 NOTE — Progress Notes (Signed)
Attending MD was notified of MEWS score of 3. Md aware about elevated HR and  Temp. No new order received. Pt.  is stable.

## 2019-11-24 NOTE — Progress Notes (Signed)
Foley removed at 8:45 am.

## 2019-11-24 NOTE — Progress Notes (Signed)
Patient requested ice bag to right elbow.  Stated that he had gout in that elbow.  Gout not noted on H&P. Says he drinks a lot of water to keep it under control; usually shows up in Left ankle and Right elbow. Barbaraann Faster, RN 11/24/2019

## 2019-11-25 LAB — BASIC METABOLIC PANEL
Anion gap: 11 (ref 5–15)
BUN: 11 mg/dL (ref 6–20)
CO2: 29 mmol/L (ref 22–32)
Calcium: 8 mg/dL — ABNORMAL LOW (ref 8.9–10.3)
Chloride: 99 mmol/L (ref 98–111)
Creatinine, Ser: 0.56 mg/dL — ABNORMAL LOW (ref 0.61–1.24)
GFR calc Af Amer: >60 mL/min (ref >60)
GFR calc non Af Amer: >60 mL/min (ref >60)
Glucose, Bld: 123 mg/dL — ABNORMAL HIGH (ref 70–99)
Potassium: 3.1 mmol/L — ABNORMAL LOW (ref 3.5–5.1)
Sodium: 139 mmol/L (ref 135–145)

## 2019-11-25 LAB — CBC
HCT: 36.1 % — ABNORMAL LOW (ref 39.0–52.0)
Hemoglobin: 12.4 g/dL — ABNORMAL LOW (ref 13.0–17.0)
MCH: 32.7 pg (ref 26.0–34.0)
MCHC: 34.3 g/dL (ref 30.0–36.0)
MCV: 95.3 fL (ref 80.0–100.0)
Platelets: 283 10*3/uL (ref 150–400)
RBC: 3.79 MIL/uL — ABNORMAL LOW (ref 4.22–5.81)
RDW: 13.2 % (ref 11.5–15.5)
WBC: 18.8 10*3/uL — ABNORMAL HIGH (ref 4.0–10.5)
nRBC: 0 % (ref 0.0–0.2)

## 2019-11-25 LAB — PHOSPHORUS: Phosphorus: 2.4 mg/dL — ABNORMAL LOW (ref 2.5–4.6)

## 2019-11-25 LAB — MAGNESIUM: Magnesium: 2.4 mg/dL (ref 1.7–2.4)

## 2019-11-25 MED ORDER — KETOROLAC TROMETHAMINE 30 MG/ML IJ SOLN
30.0000 mg | Freq: Three times a day (TID) | INTRAMUSCULAR | Status: AC
  Administered 2019-11-25 – 2019-11-30 (×14): 30 mg via INTRAVENOUS
  Filled 2019-11-25 (×14): qty 1

## 2019-11-25 MED ORDER — POTASSIUM PHOSPHATES 15 MMOLE/5ML IV SOLN
20.0000 mmol | Freq: Once | INTRAVENOUS | Status: AC
  Administered 2019-11-25: 20 mmol via INTRAVENOUS
  Filled 2019-11-25: qty 6.67

## 2019-11-25 MED ORDER — METHYLPREDNISOLONE SODIUM SUCC 40 MG IJ SOLR
40.0000 mg | Freq: Every day | INTRAMUSCULAR | Status: DC
  Administered 2019-11-25 – 2019-11-26 (×2): 40 mg via INTRAVENOUS
  Filled 2019-11-25 (×3): qty 1

## 2019-11-25 MED ORDER — HYDRALAZINE HCL 20 MG/ML IJ SOLN
5.0000 mg | Freq: Four times a day (QID) | INTRAMUSCULAR | Status: DC | PRN
  Administered 2019-11-25 – 2019-11-26 (×4): 5 mg via INTRAVENOUS
  Filled 2019-11-25 (×3): qty 1

## 2019-11-25 MED ORDER — NICOTINE 14 MG/24HR TD PT24
14.0000 mg | MEDICATED_PATCH | Freq: Every day | TRANSDERMAL | Status: DC
  Administered 2019-11-25 – 2019-11-28 (×4): 14 mg via TRANSDERMAL
  Filled 2019-11-25 (×5): qty 1

## 2019-11-25 NOTE — Progress Notes (Signed)
[facility] Hospital Day(s): 3.   Post op day(s): 3 Days Post-Op.   Interval History: Patient seen and examined, no acute events or new complaints overnight. Patient reports getting better from the abdominal pain but having more pain from the gout.  Still denies any bowel function.  No new episode of fever today heart rate slowly decreasing still over 100.  Vital signs in last 24 hours: [min-max] current  Temp:  [98.1 F (36.7 C)-99.1 F (37.3 C)] 98.6 F (37 C) (04/07 0535) Pulse Rate:  [113-120] 117 (04/07 1223) Resp:  [18-24] 18 (04/07 0535) BP: (150-185)/(92-101) 185/101 (04/07 1223) SpO2:  [91 %-98 %] 98 % (04/07 1223)     Height: 6\' 1"  (185.4 cm) Weight: 117 kg BMI (Calculated): 34.04   Physical Exam:  Constitutional: alert, cooperative and no distress  Respiratory: breathing non-labored at rest  Cardiovascular: regular rate and sinus rhythm  Gastrointestinal: soft, mild-tender, and distended.  Wound with drainage in the lower portion.  Labs:  CBC Latest Ref Rng & Units 11/25/2019 11/23/2019 11/22/2019  WBC 4.0 - 10.5 K/uL 18.8(H) 16.5(H) 18.7(H)  Hemoglobin 13.0 - 17.0 g/dL 12.4(L) 14.6 15.4  Hematocrit 39.0 - 52.0 % 36.1(L) 42.7 44.1  Platelets 150 - 400 K/uL 283 221 255   CMP Latest Ref Rng & Units 11/25/2019 11/23/2019 11/22/2019  Glucose 70 - 99 mg/dL 123(H) 144(H) 148(H)  BUN 6 - 20 mg/dL 11 12 17   Creatinine 0.61 - 1.24 mg/dL 0.56(L) 0.74 1.04  Sodium 135 - 145 mmol/L 139 136 132(L)  Potassium 3.5 - 5.1 mmol/L 3.1(L) 3.4(L) 3.5  Chloride 98 - 111 mmol/L 99 99 91(L)  CO2 22 - 32 mmol/L 29 25 26   Calcium 8.9 - 10.3 mg/dL 8.0(L) 7.7(L) 9.4  Total Protein 6.5 - 8.1 g/dL - - 8.4(H)  Total Bilirubin 0.3 - 1.2 mg/dL - - 1.6(H)  Alkaline Phos 38 - 126 U/L - - 56  AST 15 - 41 U/L - - 24  ALT 0 - 44 U/L - - 24    Imaging studies: No new pertinent imaging studies   Assessment/Plan:  29 y.o.malewith perforated diverticulitis3 Day Post-Ops/p Hartman's  procedure.  Patient recovering slowly. Still without bowel function.  We will continue n.p.o., NGT.  There has been no fever today.  Still with tachycardia.  Worsening from gout pain.  Maybe he is not absorbing colchicine.  I was trying to avoid corticosteroid due to his intra-abdominal infection.  I will give him 1 dose of Solu-Medrol to see if this makes the pain better.  If there is no improvement will need to consult rheumatology.  We will also add Toradol as an NSAID for pain management.  We will continue with DVT prophylaxis.  I encouraged the patient to ambulate.  I understand that it is difficult for him to get out of bed due to the gout pain but he is doing his best.  Arnold Long, MD

## 2019-11-26 ENCOUNTER — Inpatient Hospital Stay: Payer: No Typology Code available for payment source

## 2019-11-26 ENCOUNTER — Encounter: Payer: Self-pay | Admitting: General Surgery

## 2019-11-26 IMAGING — CT CT ABD-PELV W/ CM
2 of 5 series · 14 of 46 positions shown, 16 images · IV contrast (APPLIED)
Comparison: Preoperative CT [DATE]

CLINICAL DATA: Abdominal pain and fever 3 days POLLOCK
procedure for perforated diverticulitis.

EXAM:
CT ABDOMEN AND PELVIS WITH CONTRAST
TECHNIQUE: Multidetector CT imaging of the abdomen and pelvis was performed
using the standard protocol following bolus administration of
intravenous contrast.
CONTRAST:  125mL OMNIPAQUE IOHEXOL 300 MG/ML  SOLN

[Series 2: axial st · axial · 0.98mm/px · z∈[-588,-108]mm · 11 of 114 slices shown, 13 images]
[im 9/114  soft-tissue]
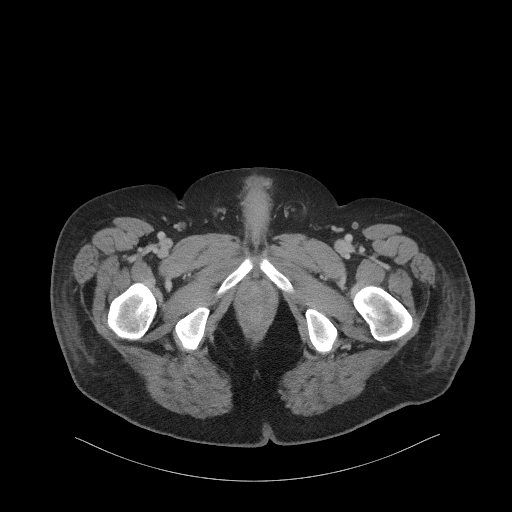
[im 9/114  bone]
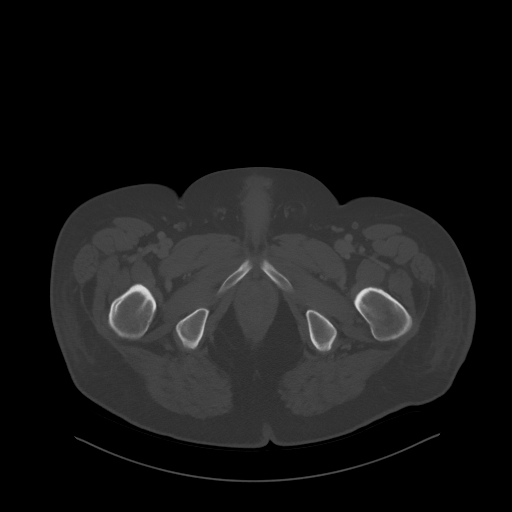
[im 17/114  soft-tissue]
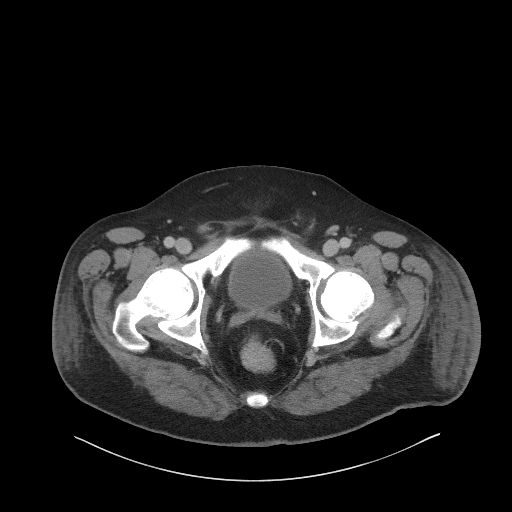
[im 25/114  soft-tissue]
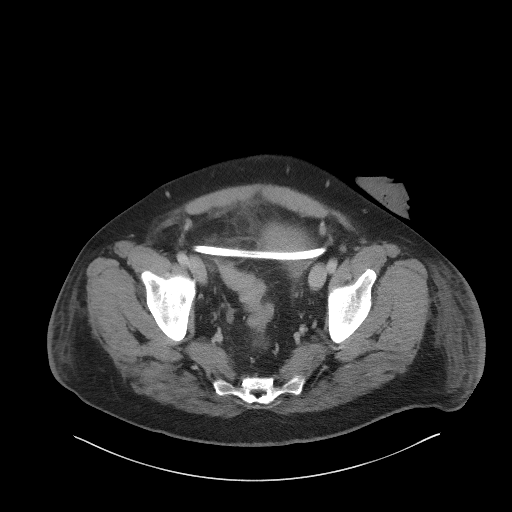
[im 41/114  soft-tissue]
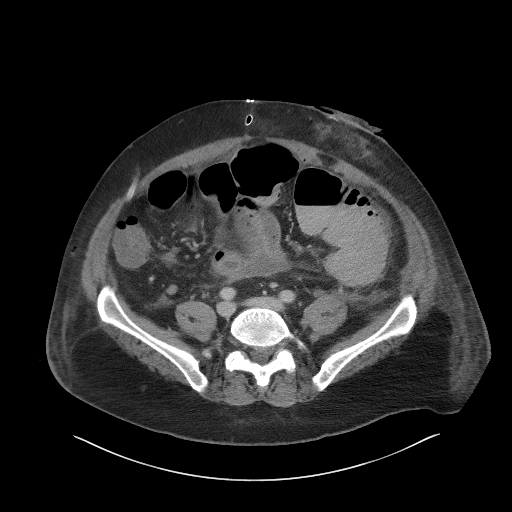
[im 49/114  soft-tissue]
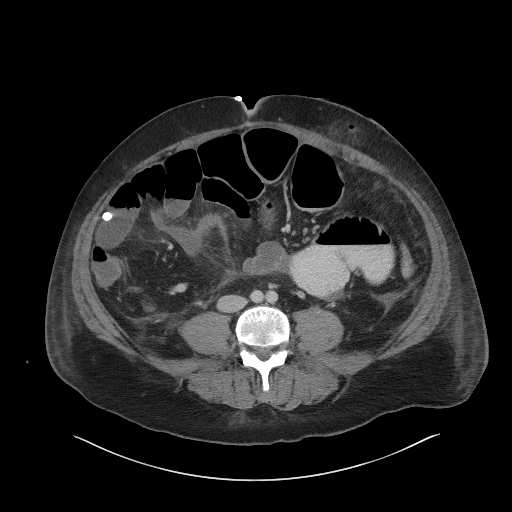
[im 57/114  soft-tissue]
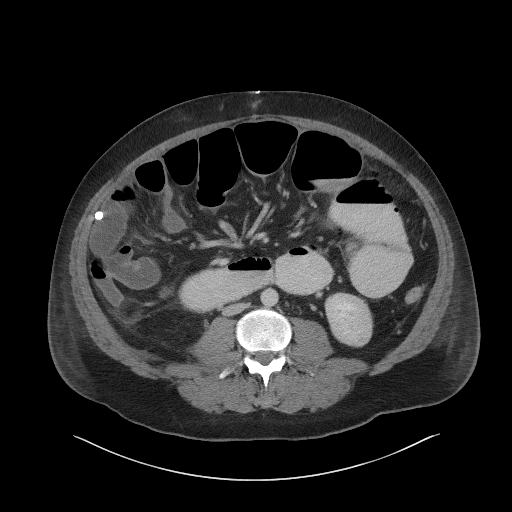
[im 65/114  soft-tissue]
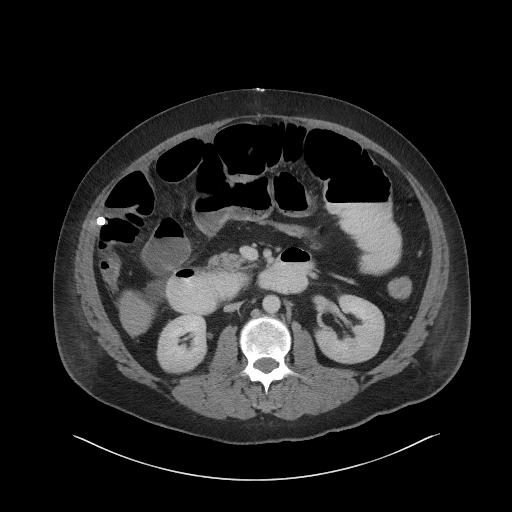
[im 73/114  soft-tissue]
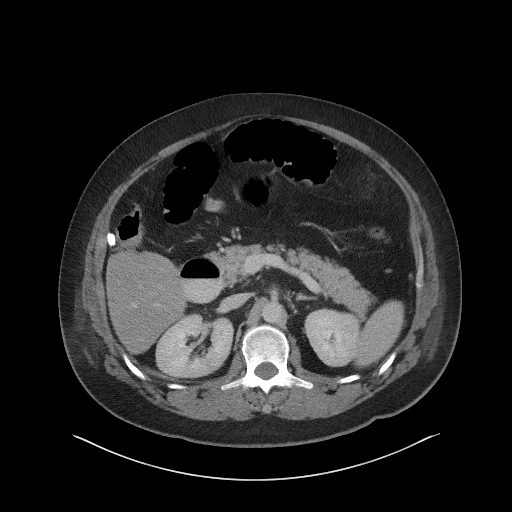
[im 89/114  soft-tissue]
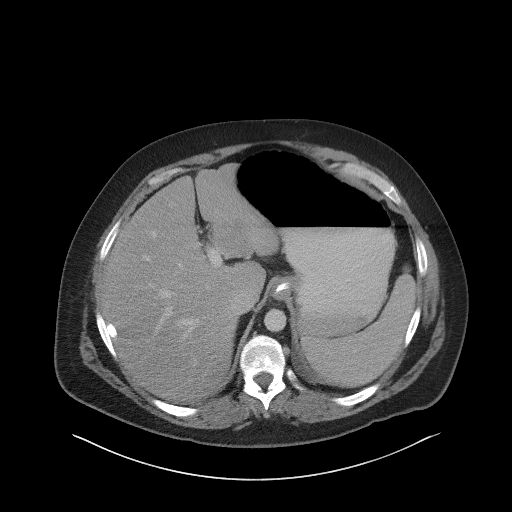
[im 89/114  bone]
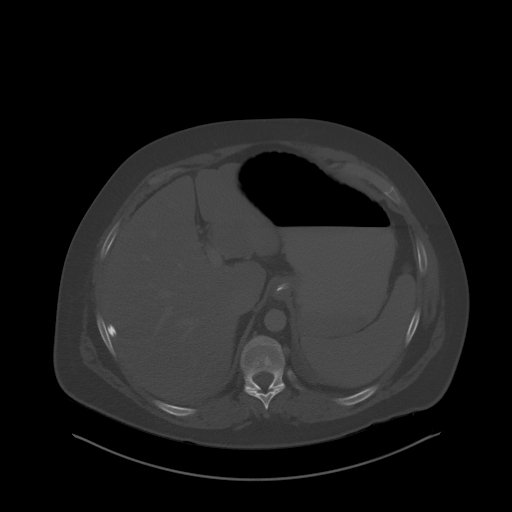
[im 97/114  soft-tissue]
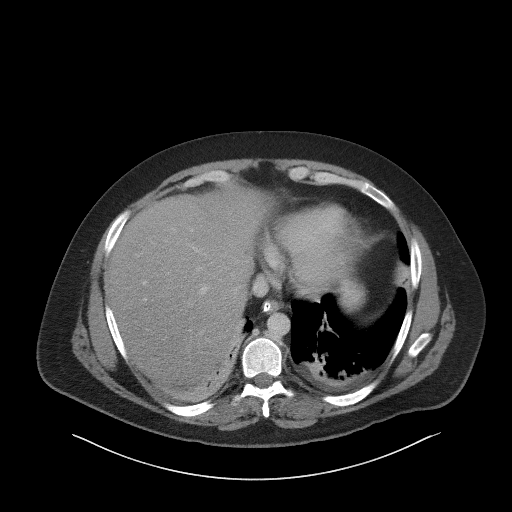
[im 105/114  soft-tissue]
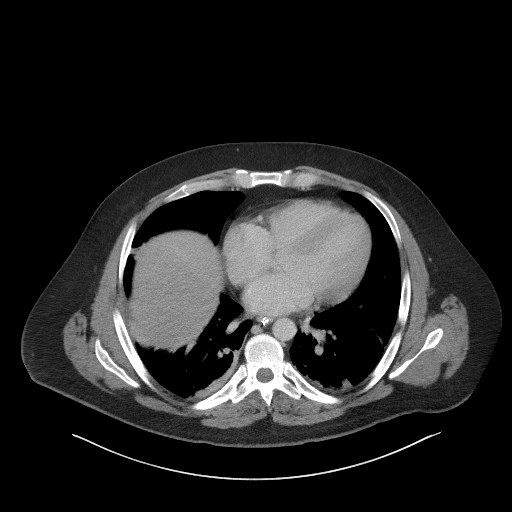

[Series 5: coronal st · coronal · 0.82mm/px · 3 of 119 slices shown]
[im 40/119  soft-tissue]
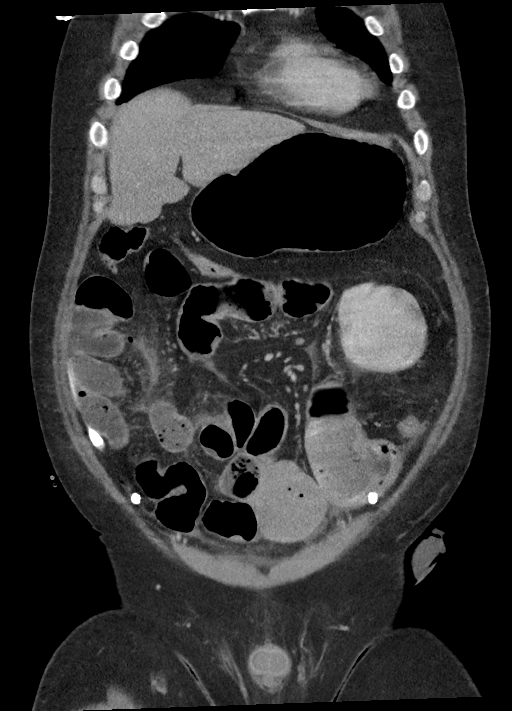
[im 53/119  soft-tissue]
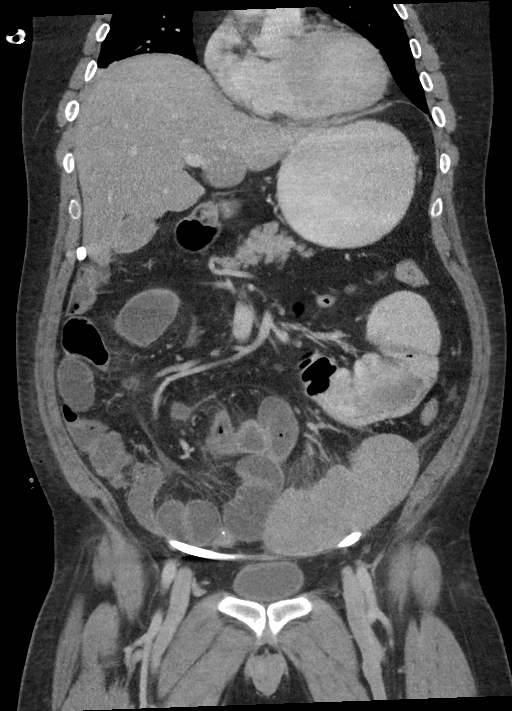
[im 66/119  soft-tissue]
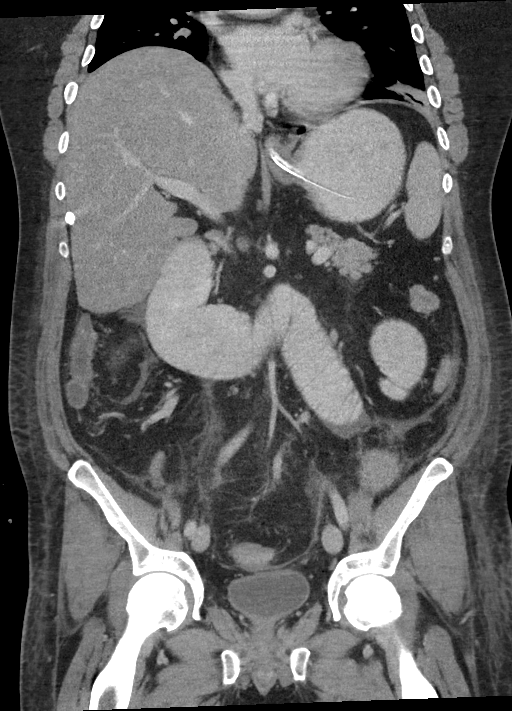

[14 of 46 positions shown; findings below may reference images not displayed]

FINDINGS: Lower chest: Linear opacities in both lower lobes are consistent
with atelectasis. Small left pleural effusion.

Hepatobiliary: Hepatic steatosis. Previous low-density region in the
central left lobe of the liver is not as prevalent currently, and
likely represents focal fatty infiltration. A drain courses lateral
to the right lobe of the liver. High-density material in the
gallbladder not seen on prior is likely combination of vicarious
excretion of IV contrast in sludge. No pericholecystic inflammation.
No biliary dilatation.

Pancreas: No ductal dilatation or inflammation.

Spleen: Normal in size without focal abnormality.

Adrenals/Urinary Tract: Normal adrenal glands. No hydronephrosis or
perinephric edema. Homogeneous renal enhancement with symmetric
excretion on delayed phase imaging. Urinary bladder is
physiologically distended without wall thickening. Small focus of
air in the urinary bladder is likely related to recent
instrumentation.

Stomach/Bowel: Enteric tube tip in the stomach. Stomach distended
with air and contrast. No gastric wall thickening. Proximal small
bowel are dilated and fluid/contrast filled. Enteric contrast only
reaches the mid small bowel. Gradual transition from dilated to
nondilated in the distal ileum with fecalization of small bowel
contents. Distal most and terminal ileum demonstrate mild mucosal
enhancement. Normal appendix. Fluid/liquid stool in the ascending
colon. Remainder of the colon is decompressed. Descending colostomy
which is unremarkable. Stapled off sigmoid colon in the pelvis is
unremarkable.

Vascular/Lymphatic: Abdominal aorta is normal in caliber. The portal
vein is patent. No mesenteric venous gas or thrombus. No enlarged
lymph nodes in the abdomen or pelvis.

Reproductive: Prostate is unremarkable.

Other: 2 drains, 1 courses into the low pelvis anteriorly, with
second coursing into the right upper quadrant. No focal fluid
collection related to either drain. Small amount of interloop non
organized fluid adjacent to ileal small bowel, series 2, image 73,
without peripheral enhancement or internal air. No other focal fluid
collection. Resolution of free intra-abdominal air postop. Midline
laparotomy with skin staples. No subcutaneous fluid collection. Mild
bilateral flank edema. Generalized mesenteric edema and trace free
fluid in the pericolic gutters.

Musculoskeletal: There are no acute or suspicious osseous
abnormalities.
IMPRESSION: 1. Post recent sigmoid colectomy and descending colostomy. Small
amount of interloop non organized fluid adjacent to ileal small
bowel, without peripheral enhancement or internal air. This is
likely residual postop fluid, continued follow-up recommended if
there are persistent fevers and clinical concern for abscess.
2. Postoperative ileus. Dilated fluid/contrast filled proximal small
bowel with fecalization of small bowel contents. Gradual transition
from dilated to nondilated in the distal ileum where there is mild
mucosal enhancement.
3. Hepatic steatosis. High-density material in the gallbladder not
seen on prior is likely combination of vicarious excretion of IV
contrast in sludge.
4. Small left pleural effusion. Bilateral lower lobe atelectasis.

## 2019-11-26 MED ORDER — ALUM & MAG HYDROXIDE-SIMETH 200-200-20 MG/5ML PO SUSP
30.0000 mL | ORAL | Status: DC | PRN
  Administered 2019-11-26 – 2019-11-27 (×2): 30 mL via ORAL
  Filled 2019-11-26 (×2): qty 30

## 2019-11-26 MED ORDER — HYDRALAZINE HCL 20 MG/ML IJ SOLN
5.0000 mg | Freq: Once | INTRAMUSCULAR | Status: AC
  Administered 2019-11-26: 5 mg via INTRAVENOUS
  Filled 2019-11-26: qty 1

## 2019-11-26 MED ORDER — IOHEXOL 300 MG/ML  SOLN
125.0000 mL | Freq: Once | INTRAMUSCULAR | Status: AC | PRN
  Administered 2019-11-26: 125 mL via INTRAVENOUS

## 2019-11-26 MED ORDER — HYDRALAZINE HCL 20 MG/ML IJ SOLN: 10.0000 mg | Freq: Four times a day (QID) | INTRAMUSCULAR | Status: DC | PRN

## 2019-11-26 MED ORDER — IOHEXOL 9 MG/ML PO SOLN
500.0000 mL | ORAL | Status: AC
  Administered 2019-11-26 (×2): 500 mL via ORAL

## 2019-11-26 NOTE — Consult Note (Signed)
[facility] Nurse ostomy follow up Stoma type/location: LLQ colostomy. Wife Garment/textile technologist) is staying with patient and present and participating in session. Stomal assessment/size: 1 and 1/2 x 1 and 3/4 inch oval, slightly budded.  Lumen points slightly downward, stoma still edematous. Dr. Zachery Dauer in to assess during pouch change, able to insert 5th digit in its entirety. Patent. Peristomal assessment: intact, clear Treatment options for stomal/peristomal skin: skin barrier ring Output: thick, yellow mucus at stoma, no output or flatus. Ostomy pouching: 2pc. 2 and 3/4 inch pouching system with skin barrier ring. Education provided:  Explained role of ostomy nurse and creation of stoma  Explained stoma characteristics (budded, flush, color, texture, care) Wife able to give return demonstration of pouch change with cueing (cutting new skin barrier, measuring stoma, cleaning peristomal skin and stoma, use of barrier ring) Education on emptying when 1/3 to 1/2 full and how to empty Demonstrated use of wick to clean spout  Answered patient/family questions to their expressed satisfaction.    5 pouches, 5 skin barriers, 5 rings at bedside.  Used teaching guide with step-by-step instructions for pouch change as a teaching aide today. Patient will not require a pouch change until Monday, 11/30/19.  Enrolled patient in [place] Discharge program: Yes, previously.  [facility] nursing team will follow along with you while in house, and will remain available to this patient, the nursing and medical teams. Thanks, Maudie Flakes, MSN, RN, [place], Arther Abbott  Pager# [phone]

## 2019-11-26 NOTE — Progress Notes (Signed)
Patient given PRN 5 mg Hydralazine IVP for hypertension. BP was only slightly decreased. MD increased Hydralazine to 10 mg. Per MD can give next dose in six hours from last dose given. Patient asymptomatic.   Fuller Mandril, RN

## 2019-11-26 NOTE — Consult Note (Signed)
Reason for Consult: Gout  Referring Physician: Dr. 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  Diagnosed as gout given clinical presentation and hyperuricemia.  Usually is treated with episodic steroids.  Sometimes several times a year.  Last episode was 1 month ago.  Prior podagra.  Prior ankle synovitis.  Never had any kidney stones Positive family history in father.  Prior significant alcohol use No prior allopurinol or any remittive agent A week ago he developed diarrhea for about 3 days.  Then had fever.  Was seen in the walk-in clinic and then had abdominal pain.  Presented to the emergency room where he had diverticulitis with abscess.  Leukocytosis.  Tachycardia. Had surgery now with colostomy.  On antibiotics.  Still episodic fever Hypertension.  Creatinine normal at 0.5 After surgery he developed pain and swelling left ankle, both elbows and now left hand.  Thought to be his typical gout by his report.  Had IV Solu-Medrol 40 mg yesterday with improvement in the ankle.  Still has left hand bilateral ankle pain. Had transient colchicine but is n.p.o. now with NG drainage  PMH: Hypertension.  Obesity.  Gout  SURGICAL HISTORY: No joint surgeries  Family History: Gout on the father  Social History: Prior regular alcohol use  Allergies:  Allergies  Allergen Reactions  . Amoxicillin Rash    Medications:  Scheduled: . enoxaparin (LOVENOX) injection  40 mg Subcutaneous Q24H  . ketorolac  30 mg Intravenous Q8H  . methylPREDNISolone (SOLU-MEDROL) injection  40 mg Intravenous Daily  . nicotine  14 mg Transdermal Daily  . pantoprazole (PROTONIX) IV  40 mg Intravenous QHS  . polyethylene glycol  17 g Oral Daily        ROS: No history of inflammatory bowel disease.  No history of tophi or gouty nodules.  No history of kidney stones   PHYSICAL EXAM: Blood pressure (!)  169/96, pulse (!) 102, temperature 98.1 F (36.7 C), temperature source Oral, resp. rate 18, height 6\' 1"  (1.854 m), weight 117 kg, SpO2 92 %. Facial flushing.  Distant lung sounds.  1+ edema Musculoskeletal: Good range of motion neck and shoulders.  No elbow nodules.  Some pain with flexion extension of the elbow.  Cannot palpate elbow synovitis or olecranon bursal swelling.  Right wrist MCPs and PIPs without synovitis.  Left wrist and third MCP are puffy and tender with motion.  Hips move well.  No knee effusion.  Good range of motion both ankles left ankle more swollen than the right.  Right first MTP minimally tender.   heel palpation is nontender  Assessment: Polyarticular synovitis postop.  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  Risk factors of hypertension obesity and family history and alcohol Diverticular abscess status post perforation.  Now postop Obesity Hypertension  Recommendations: Hesitant to use colchicine given his n.p.o. status and recent bowel surgery As we discussed, another dose of 40 mg of Medrol today Left wrist is not really amenable to injection.  If he has a good deal of pain it might be splinted.  Suspect he will gradually improve We will check uric acid Discussed with him and his wife that he would need to be on long-term agent lower uric acid.  once he is taking p.o. he could start allopurinol 100 mg a day and we could follow-up his gout in the office by checking creatinine and uric acid  in a month and adjusting his dose. Thank you very much  Emmaline Kluver 11/26/2019, 1:20 PM

## 2019-11-26 NOTE — Progress Notes (Signed)
[facility] [place] Hospital Day(s): 4.   Post op day(s): 4 Days Post-Op.   Interval History: Patient seen and examined, no acute events or new complaints overnight. Patient reports continued stable pain from abdominal pain.  Gout pain is improving.  He reports that he can move his upper extremity.  Denies nausea or vomiting.  Denies passing gas.  Vital signs in last 24 hours: [min-max] current  Temp:  [97.9 F (36.6 C)-98.7 F (37.1 C)] 98.6 F (37 C) (04/08 1937) Pulse Rate:  [102-105] 102 (04/08 1937) Resp:  [18-20] 20 (04/08 1937) BP: (169-177)/(96-107) 173/98 (04/08 1937) SpO2:  [91 %-97 %] 93 % (04/08 1937)     Height: 6\' 1"  (185.4 cm) Weight: 117 kg BMI (Calculated): 34.04   Physical Exam:  Constitutional: alert, cooperative and no distress  Respiratory: breathing non-labored at rest  Cardiovascular: regular rate and sinus rhythm  Gastrointestinal: soft, non-tender, and distended. Ostomy patent with edema. Midline wound is dry and clean.   Labs:  CBC Latest Ref Rng & Units 11/25/2019 11/23/2019 11/22/2019  WBC 4.0 - 10.5 K/uL 18.8(H) 16.5(H) 18.7(H)  Hemoglobin 13.0 - 17.0 g/dL 12.4(L) 14.6 15.4  Hematocrit 39.0 - 52.0 % 36.1(L) 42.7 44.1  Platelets 150 - 400 K/uL 283 221 255   CMP Latest Ref Rng & Units 11/25/2019 11/23/2019 11/22/2019  Glucose 70 - 99 mg/dL 123(H) 144(H) 148(H)  BUN 6 - 20 mg/dL 11 12 17   Creatinine 0.61 - 1.24 mg/dL 0.56(L) 0.74 1.04  Sodium 135 - 145 mmol/L 139 136 132(L)  Potassium 3.5 - 5.1 mmol/L 3.1(L) 3.4(L) 3.5  Chloride 98 - 111 mmol/L 99 99 91(L)  CO2 22 - 32 mmol/L 29 25 26   Calcium 8.9 - 10.3 mg/dL 8.0(L) 7.7(L) 9.4  Total Protein 6.5 - 8.1 g/dL - - 8.4(H)  Total Bilirubin 0.3 - 1.2 mg/dL - - 1.6(H)  Alkaline Phos 38 - 126 U/L - - 56  AST 15 - 41 U/L - - 24  ALT 0 - 44 U/L - - 24    Imaging studies: No new pertinent imaging studies   Assessment/Plan:  28 y.o.malewith perforated diverticulitis4Day Post-Ops/p Hartman's  procedure.  Patient with no clinical deterioration. Continue with ileus. No fever today. Still with tachycardia but improving. Ct scan shows interloop fluid but not abscess. Picture most likely from ileus. No free air or contrast extravasation.   Gout continue to be an issue but improving.  Today second dose of slow control.  I consulted rheumatology.  I appreciate rheumatology recommendations and will be followed.  Will repeat labs in the morning.  We will continue with IV antibiotic therapy.  We will continue with DVT prophylaxis.  Encourage the patient to ambulate or at least get out of bed as possible.  We will continue with local care of the wound.  Arnold Long, MD

## 2019-11-27 LAB — BLOOD CULTURE ID PANEL (REFLEXED)

## 2019-11-27 LAB — CBC
HCT: 37.3 % — ABNORMAL LOW (ref 39.0–52.0)
Hemoglobin: 12.6 g/dL — ABNORMAL LOW (ref 13.0–17.0)
MCH: 32.1 pg (ref 26.0–34.0)
MCHC: 33.8 g/dL (ref 30.0–36.0)
MCV: 94.9 fL (ref 80.0–100.0)
Platelets: 366 10*3/uL (ref 150–400)
RBC: 3.93 MIL/uL — ABNORMAL LOW (ref 4.22–5.81)
RDW: 13.4 % (ref 11.5–15.5)
WBC: 15.3 10*3/uL — ABNORMAL HIGH (ref 4.0–10.5)
nRBC: 0 % (ref 0.0–0.2)

## 2019-11-27 LAB — BASIC METABOLIC PANEL
Anion gap: 9 (ref 5–15)
BUN: 19 mg/dL (ref 6–20)
CO2: 26 mmol/L (ref 22–32)
Calcium: 8.1 mg/dL — ABNORMAL LOW (ref 8.9–10.3)
Chloride: 104 mmol/L (ref 98–111)
Creatinine, Ser: 0.63 mg/dL (ref 0.61–1.24)
GFR calc Af Amer: >60 mL/min (ref >60)
GFR calc non Af Amer: >60 mL/min (ref >60)
Glucose, Bld: 92 mg/dL (ref 70–99)
Potassium: 3.2 mmol/L — ABNORMAL LOW (ref 3.5–5.1)
Sodium: 139 mmol/L (ref 135–145)

## 2019-11-27 LAB — URIC ACID: Uric Acid, Serum: 6.4 mg/dL (ref 3.7–8.6)

## 2019-11-27 LAB — MAGNESIUM: Magnesium: 2.4 mg/dL (ref 1.7–2.4)

## 2019-11-27 LAB — PHOSPHORUS: Phosphorus: 3.1 mg/dL (ref 2.5–4.6)

## 2019-11-27 MED ORDER — POTASSIUM CHLORIDE IN NACL 20-0.9 MEQ/L-% IV SOLN
INTRAVENOUS | Status: DC
  Filled 2019-11-27 (×3): qty 1000

## 2019-11-27 MED ORDER — METHYLPREDNISOLONE SODIUM SUCC 40 MG IJ SOLR: 20.0000 mg | Freq: Every day | INTRAMUSCULAR | Status: DC

## 2019-11-27 MED ORDER — METHYLPREDNISOLONE SODIUM SUCC 40 MG IJ SOLR
20.0000 mg | Freq: Every day | INTRAMUSCULAR | Status: DC
  Administered 2019-11-27 – 2019-11-29 (×3): 20 mg via INTRAVENOUS
  Filled 2019-11-27 (×2): qty 1

## 2019-11-27 MED ORDER — METHYLPREDNISOLONE SODIUM SUCC 40 MG IJ SOLR
20.0000 mg | Freq: Once | INTRAMUSCULAR | Status: AC
  Administered 2019-11-27: 16:00:00 20 mg via INTRAVENOUS

## 2019-11-27 NOTE — Consult Note (Signed)
[facility] for Electrolyte Monitoring and Replacement   Recent Labs: Potassium (mmol/L)  Date Value  11/27/2019 3.2 (L)   Magnesium (mg/dL)  Date Value  11/27/2019 2.4   Calcium (mg/dL)  Date Value  11/27/2019 8.1 (L)   Albumin (g/dL)  Date Value  11/22/2019 3.6   Phosphorus (mg/dL)  Date Value  11/27/2019 3.1   Sodium (mmol/L)  Date Value  11/27/2019 139   Corrected Ca: 8.4 mg/dL  Assessment: 29 y.o.malewith perforated diverticulitis5Days Post-Ops/p Hartman's procedure and continued electrolyte abnormalities in spite of replacement. He is currently being administered 0.9% NaCl MIVF at 125 mL/hr  Goal of Therapy:  Electrolytes WNL  Plan:   Add 20 mEq/L KCl to  0.9% NaCl MIVF and continue at 125 mL/hr  Follow-up electrolyte levels in am and replace as needed  Dallie Piles ,PharmD Clinical Pharmacist 11/27/2019 8:23 AM

## 2019-11-27 NOTE — Progress Notes (Signed)
[facility] Hospital Day(s): 5.   Post op day(s): 5 Days Post-Op.   Interval History: Patient seen and examined, no acute events or new complaints overnight. Patient reports improving pain from elbows and ankles.  Patient no nausea or vomiting.  Patient feels more comfortable with abdominal discomfort.  Report he passed some stool and gas.  Vital signs in last 24 hours: [min-max] current  Temp:  [98 F (36.7 C)-99.3 F (37.4 C)] 98.4 F (36.9 C) (04/09 1157) Pulse Rate:  [98-103] 98 (04/09 1157) Resp:  [18-20] 18 (04/09 1157) BP: (156-177)/(94-107) 169/96 (04/09 1157) SpO2:  [91 %-96 %] 94 % (04/09 1157)     Height: 6\' 1"  (185.4 cm) Weight: 117 kg BMI (Calculated): 34.04   Physical Exam:  Constitutional: alert, cooperative and no distress  Respiratory: breathing non-labored at rest  Cardiovascular: regular rate and sinus rhythm  Gastrointestinal: soft, non-tender, and mild-distended.  Wound dry.  Ostomy patent  Labs:  CBC Latest Ref Rng & Units 11/27/2019 11/25/2019 11/23/2019  WBC 4.0 - 10.5 K/uL 15.3(H) 18.8(H) 16.5(H)  Hemoglobin 13.0 - 17.0 g/dL 12.6(L) 12.4(L) 14.6  Hematocrit 39.0 - 52.0 % 37.3(L) 36.1(L) 42.7  Platelets 150 - 400 K/uL 366 283 221   CMP Latest Ref Rng & Units 11/27/2019 11/25/2019 11/23/2019  Glucose 70 - 99 mg/dL 92 123(H) 144(H)  BUN 6 - 20 mg/dL 19 11 12   Creatinine 0.61 - 1.24 mg/dL 0.63 0.56(L) 0.74  Sodium 135 - 145 mmol/L 139 139 136  Potassium 3.5 - 5.1 mmol/L 3.2(L) 3.1(L) 3.4(L)  Chloride 98 - 111 mmol/L 104 99 99  CO2 22 - 32 mmol/L 26 29 25   Calcium 8.9 - 10.3 mg/dL 8.1(L) 8.0(L) 7.7(L)  Total Protein 6.5 - 8.1 g/dL - - -  Total Bilirubin 0.3 - 1.2 mg/dL - - -  Alkaline Phos 38 - 126 U/L - - -  AST 15 - 41 U/L - - -  ALT 0 - 44 U/L - - -    Imaging studies: No new pertinent imaging studies   Assessment/Plan:  28 y.o.malewith perforated diverticulitis4Day Post-Ops/p Hartman's procedure.  Patient recovering slowly.  Now  with some gas and stool in the ostomy bag.  Will clamp NGT and give clear liquid trial.  We will continue the IV antibiotic.  There was a decrease in white blood cell count.  Hemoglobin stable.  There is improvement of gout pain.  Will start taperin Solu-Medrol.  Appreciate rheumatology input and recommendations for the management of these acute gout attack.  We will continue electrolyte replacement as needed.  We will continue with DVT prophylaxis.  Encourage patient to ambulate.   Arnold Long, MD

## 2019-11-27 NOTE — Consult Note (Signed)
[facility] Nurse ostomy follow up Stoma type/location:  LLQ Colostomy. Pouching system applied yesterday is intact. Stomal assessment/size: 1 and 1/2 inch x 1 and 3/4 inch oval Peristomal assessment: not seen today Treatment options for stomal/peristomal skin: skin barrier ring Output: gas and stool Ostomy pouching: 2pc. 2 and 3/4 inch with skin barrier ring Education provided:  Questions answered regarding odor. Patient taught that pouches are odor proof and that the odor subsides when the toilet is flushed. That said, he will investigate the use of strong peppermint in his mouth while emptying and perhaps try Vicks vapor rub under his nose until he is more used to the smell.  Reports that Secure Start box has arrived at his home. Enrolled patient in [place] [facility] Discharge program: Yes  Will ask Nursing staff to allow patient/wife to empty pouch.  [facility] nursing team will follow, and will remain available to this patient, the nursing and medical teams.  Next pouch change due on Monday. Thanks, Maudie Flakes, MSN, RN, [place], Arther Abbott  Pager# [phone]

## 2019-11-27 NOTE — Progress Notes (Signed)
PHARMACY - PHYSICIAN COMMUNICATION CRITICAL VALUE ALERT - BLOOD CULTURE IDENTIFICATION (BCID)  Results for orders placed or performed during the hospital encounter of 11/22/19  Blood Culture ID Panel (Reflexed) (Collected: 11/22/2019 11:55 AM)  Result Value Ref Range   Enterococcus species NOT DETECTED NOT DETECTED   Listeria monocytogenes NOT DETECTED NOT DETECTED   Staphylococcus species NOT DETECTED NOT DETECTED   Staphylococcus aureus (BCID) NOT DETECTED NOT DETECTED   Streptococcus species NOT DETECTED NOT DETECTED   Streptococcus agalactiae NOT DETECTED NOT DETECTED   Streptococcus pneumoniae NOT DETECTED NOT DETECTED   Streptococcus pyogenes NOT DETECTED NOT DETECTED   Acinetobacter baumannii NOT DETECTED NOT DETECTED   Enterobacteriaceae species NOT DETECTED NOT DETECTED   Enterobacter cloacae complex NOT DETECTED NOT DETECTED   Escherichia coli NOT DETECTED NOT DETECTED   Klebsiella oxytoca NOT DETECTED NOT DETECTED   Klebsiella pneumoniae NOT DETECTED NOT DETECTED   Proteus species NOT DETECTED NOT DETECTED   Serratia marcescens NOT DETECTED NOT DETECTED   Haemophilus influenzae NOT DETECTED NOT DETECTED   Neisseria meningitidis NOT DETECTED NOT DETECTED   Pseudomonas aeruginosa NOT DETECTED NOT DETECTED   Candida albicans NOT DETECTED NOT DETECTED   Candida glabrata NOT DETECTED NOT DETECTED   Candida krusei NOT DETECTED NOT DETECTED   Candida parapsilosis NOT DETECTED NOT DETECTED   Candida tropicalis NOT DETECTED NOT DETECTED    Name of physician (or Provider) Contacted:  [place]-Diaz   Changes to prescribed antibiotics required: No, continue current Abx  Craig [name] 11/27/2019  8:12 PM

## 2019-11-28 LAB — RENAL FUNCTION PANEL
Albumin: 2.2 g/dL — ABNORMAL LOW (ref 3.5–5.0)
Anion gap: 7 (ref 5–15)
BUN: 16 mg/dL (ref 6–20)
CO2: 25 mmol/L (ref 22–32)
Calcium: 7.8 mg/dL — ABNORMAL LOW (ref 8.9–10.3)
Chloride: 105 mmol/L (ref 98–111)
Creatinine, Ser: 0.54 mg/dL — ABNORMAL LOW (ref 0.61–1.24)
GFR calc Af Amer: >60 mL/min (ref >60)
GFR calc non Af Amer: >60 mL/min (ref >60)
Glucose, Bld: 86 mg/dL (ref 70–99)
Phosphorus: 3 mg/dL (ref 2.5–4.6)
Potassium: 4 mmol/L (ref 3.5–5.1)
Sodium: 137 mmol/L (ref 135–145)

## 2019-11-28 LAB — MAGNESIUM: Magnesium: 2.2 mg/dL (ref 1.7–2.4)

## 2019-11-28 MED ORDER — SODIUM CHLORIDE 0.9 % IV SOLN: INTRAVENOUS | Status: DC

## 2019-11-28 MED ORDER — DIPHENHYDRAMINE HCL 25 MG PO CAPS
25.0000 mg | ORAL_CAPSULE | Freq: Every evening | ORAL | Status: DC | PRN
  Administered 2019-11-28 – 2019-12-01 (×4): 25 mg via ORAL
  Filled 2019-11-28 (×4): qty 1

## 2019-11-28 NOTE — Progress Notes (Signed)
[facility] Hospital Day(s): 6.   Post op day(s): 6 Days Post-Op.   Interval History: Patient seen and examined, no acute events or new complaints overnight. Patient reports continue improving and feeling better every day.  She reports that the pains are improving.  Abdominal pain decreasing.  Joint pains are also improving.  He denies nausea or vomiting.  Reports tolerating clear liquids.  Reports continue passing gas or stool through colostomy.  Vital signs in last 24 hours: [min-max] current  Temp:  [98.4 F (36.9 C)-98.5 F (36.9 C)] 98.4 F (36.9 C) (04/10 0636) Pulse Rate:  [85-98] 85 (04/10 0636) Resp:  [16-20] 16 (04/10 0636) BP: (156-169)/(96-104) 156/104 (04/10 0636) SpO2:  [93 %-94 %] 93 % (04/10 0636)     Height: 6\' 1"  (185.4 cm) Weight: 117 kg BMI (Calculated): 34.04   Physical Exam:  Constitutional: alert, cooperative and no distress  Respiratory: breathing non-labored at rest  Cardiovascular: regular rate and sinus rhythm  Gastrointestinal: soft, non-tender, and non-distended. Wound dry and clean with small amount of drainage. Colostomy pink and patent.   Labs:  CBC Latest Ref Rng & Units 11/27/2019 11/25/2019 11/23/2019  WBC 4.0 - 10.5 K/uL 15.3(H) 18.8(H) 16.5(H)  Hemoglobin 13.0 - 17.0 g/dL 12.6(L) 12.4(L) 14.6  Hematocrit 39.0 - 52.0 % 37.3(L) 36.1(L) 42.7  Platelets 150 - 400 K/uL 366 283 221   CMP Latest Ref Rng & Units 11/28/2019 11/27/2019 11/25/2019  Glucose 70 - 99 mg/dL 86 92 123(H)  BUN 6 - 20 mg/dL 16 19 11   Creatinine 0.61 - 1.24 mg/dL 0.54(L) 0.63 0.56(L)  Sodium 135 - 145 mmol/L 137 139 139  Potassium 3.5 - 5.1 mmol/L 4.0 3.2(L) 3.1(L)  Chloride 98 - 111 mmol/L 105 104 99  CO2 22 - 32 mmol/L 25 26 29   Calcium 8.9 - 10.3 mg/dL 7.8(L) 8.1(L) 8.0(L)  Total Protein 6.5 - 8.1 g/dL - - -  Total Bilirubin 0.3 - 1.2 mg/dL - - -  Alkaline Phos 38 - 126 U/L - - -  AST 15 - 41 U/L - - -  ALT 0 - 44 U/L - - -    Imaging studies: No new pertinent  imaging studies   Assessment/Plan:  28 y.o.malewith perforated diverticulitis5Day Post-Ops/p Hartman's procedure.  Patient continued to recover adequately.  The last couple of days he has been improving significantly with improved pain.  He is tolerating clear liquid diet without nausea or vomiting.  He continues to pass gas and stool through the colostomy.  I will advance diet to full liquids and assess for toleration.  We will start decreasing IV fluids.  I will check white blood cell count tomorrow to assess response to current IV antibiotic therapy.  I will start to taper Solu-Medrol as his pain from gout is significantly improved.  The patient has not had fever in the last couple of days.  The heart rate is also within normal limits at this moment.  He is very willing to walk today.  We will continue with DVT prophylaxis his wife is getting trained for the colostomy.  Arnold Long, MD

## 2019-11-28 NOTE — Consult Note (Addendum)
[place] for Electrolyte Monitoring and Replacement   Recent Labs: Potassium (mmol/L)  Date Value  11/28/2019 4.0   Magnesium (mg/dL)  Date Value  11/28/2019 2.2   Calcium (mg/dL)  Date Value  11/28/2019 7.8 (L)   Albumin (g/dL)  Date Value  11/28/2019 2.2 (L)   Phosphorus (mg/dL)  Date Value  11/28/2019 3.0   Sodium (mmol/L)  Date Value  11/28/2019 137   Corrected Ca: 8.4 mg/dL  Assessment: 28 y.o.malewith perforated diverticulitis6Days Post-Ops/p Hartman's procedure and continued electrolyte abnormalities in spite of replacement. He is currently being administered 0.9% NaCl MIVF w 35meq KCl at 100 mL/hr.  K = 4.0 and hypokalemia has resolved.   Goal of Therapy:  Electrolytes WNL  Plan:   Remove 20 mEq/L KCl from  0.9% NaCl MIVF and continue at 100 mL/hr  Follow-up electrolyte levels in am and replace as needed - will sign off if no further replenishment warranted  Lu Duffel ,PharmD Clinical Pharmacist 11/28/2019 7:18 AM

## 2019-11-29 LAB — CBC
HCT: 40.1 % (ref 39.0–52.0)
Hemoglobin: 13.3 g/dL (ref 13.0–17.0)
MCH: 31.7 pg (ref 26.0–34.0)
MCHC: 33.2 g/dL (ref 30.0–36.0)
MCV: 95.5 fL (ref 80.0–100.0)
Platelets: 408 10*3/uL — ABNORMAL HIGH (ref 150–400)
RBC: 4.2 MIL/uL — ABNORMAL LOW (ref 4.22–5.81)
RDW: 13.1 % (ref 11.5–15.5)
WBC: 17.9 10*3/uL — ABNORMAL HIGH (ref 4.0–10.5)
nRBC: 0 % (ref 0.0–0.2)

## 2019-11-29 LAB — BASIC METABOLIC PANEL
Anion gap: 7 (ref 5–15)
BUN: 12 mg/dL (ref 6–20)
CO2: 23 mmol/L (ref 22–32)
Calcium: 8 mg/dL — ABNORMAL LOW (ref 8.9–10.3)
Chloride: 104 mmol/L (ref 98–111)
Creatinine, Ser: 0.66 mg/dL (ref 0.61–1.24)
GFR calc Af Amer: >60 mL/min (ref >60)
GFR calc non Af Amer: >60 mL/min (ref >60)
Glucose, Bld: 100 mg/dL — ABNORMAL HIGH (ref 70–99)
Potassium: 3.8 mmol/L (ref 3.5–5.1)
Sodium: 134 mmol/L — ABNORMAL LOW (ref 135–145)

## 2019-11-29 MED ORDER — NICOTINE 21 MG/24HR TD PT24
21.0000 mg | MEDICATED_PATCH | Freq: Every day | TRANSDERMAL | Status: DC
  Administered 2019-11-29 – 2019-11-30 (×2): 21 mg via TRANSDERMAL
  Filled 2019-11-29 (×3): qty 1

## 2019-11-29 MED ORDER — METHYLPREDNISOLONE SODIUM SUCC 40 MG IJ SOLR
20.0000 mg | Freq: Once | INTRAMUSCULAR | Status: AC
  Administered 2019-11-29: 20 mg via INTRAVENOUS
  Filled 2019-11-29: qty 1

## 2019-11-29 NOTE — Progress Notes (Signed)
[facility] Hospital Day(s): 7.   Post op day(s): 7 Days Post-Op.   Interval History: Patient seen and examined, no acute events or new complaints overnight. Patient reports continue having bowel movements. Denies nausea or vomiting.  Reports gout pain increasing. Now with pain on the knees.   Vital signs in last 24 hours: [min-max] current  Temp:  [98.1 F (36.7 C)-98.9 F (37.2 C)] 98.9 F (37.2 C) (04/11 1340) Pulse Rate:  [97-99] 99 (04/11 1340) Resp:  [16] 16 (04/11 0411) BP: (149-158)/(76-99) 158/99 (04/11 1340) SpO2:  [94 %-99 %] 94 % (04/11 1340)     Height: 6\' 1"  (185.4 cm) Weight: 117 kg BMI (Calculated): 34.04   Physical Exam:  Constitutional: alert, cooperative and no distress  Respiratory: breathing non-labored at rest  Cardiovascular: regular rate and sinus rhythm  Gastrointestinal: soft, non-tender, and non-distended. Midline wound dry and clean. Ostomy pink and patent.   Labs:  CBC Latest Ref Rng & Units 11/29/2019 11/27/2019 11/25/2019  WBC 4.0 - 10.5 K/uL 17.9(H) 15.3(H) 18.8(H)  Hemoglobin 13.0 - 17.0 g/dL 13.3 12.6(L) 12.4(L)  Hematocrit 39.0 - 52.0 % 40.1 37.3(L) 36.1(L)  Platelets 150 - 400 K/uL 408(H) 366 283   CMP Latest Ref Rng & Units 11/29/2019 11/28/2019 11/27/2019  Glucose 70 - 99 mg/dL 100(H) 86 92  BUN 6 - 20 mg/dL 12 16 19   Creatinine 0.61 - 1.24 mg/dL 0.66 0.54(L) 0.63  Sodium 135 - 145 mmol/L 134(L) 137 139  Potassium 3.5 - 5.1 mmol/L 3.8 4.0 3.2(L)  Chloride 98 - 111 mmol/L 104 105 104  CO2 22 - 32 mmol/L 23 25 26   Calcium 8.9 - 10.3 mg/dL 8.0(L) 7.8(L) 8.1(L)  Total Protein 6.5 - 8.1 g/dL - - -  Total Bilirubin 0.3 - 1.2 mg/dL - - -  Alkaline Phos 38 - 126 U/L - - -  AST 15 - 41 U/L - - -  ALT 0 - 44 U/L - - -    Imaging studies: No new pertinent imaging studies   Assessment/Plan:  28 y.o.malewith perforated diverticulitis7Day Post-Ops/p Hartman's procedure.  From diverticulitis with new colostomy standpoint  patient improving. Tolerated full liquid diet. Will advance to soft diet. Will continue IV abx therapy. No fever, no tachycardia. WBC count unreliable due to use of Solumedrol.   Patient with persistent pain from gout unable to decrease Solumedrol. Will give 40 mg today and will try to change to oral Prednisone tomorrow.   We will continue with DVT prophylaxis his wife is getting trained for the colostomy.  Arnold Long, MD

## 2019-11-29 NOTE — Consult Note (Signed)
[facility] for Electrolyte Monitoring and Replacement   Recent Labs: Potassium (mmol/L)  Date Value  11/29/2019 3.8   Magnesium (mg/dL)  Date Value  11/28/2019 2.2   Calcium (mg/dL)  Date Value  11/29/2019 8.0 (L)   Albumin (g/dL)  Date Value  11/28/2019 2.2 (L)   Phosphorus (mg/dL)  Date Value  11/28/2019 3.0   Sodium (mmol/L)  Date Value  11/29/2019 134 (L)   Corrected Ca: 8.4 mg/dL  Assessment: 29 y.o.malewith perforated diverticulitis6Days Post-Ops/p Hartman's procedure and continued electrolyte abnormalities in spite of replacement.   K = 3.8 and hypokalemia has now resolved.   Goal of Therapy:  Electrolytes WNL  Plan:   Pharmacy will sign off at this time  Lu Duffel ,PharmD Clinical Pharmacist 11/29/2019 9:30 AM

## 2019-11-30 MED ORDER — PREDNISONE 20 MG PO TABS
60.0000 mg | ORAL_TABLET | Freq: Once | ORAL | Status: AC
  Administered 2019-11-30: 60 mg via ORAL
  Filled 2019-11-30: qty 3

## 2019-11-30 NOTE — TOC Initial Note (Signed)
Transition of Care [facility]) - Initial/Assessment Note    Patient Details  Name: Craig [name] MRN: [id number] Date of Birth: 1991-05-30  [facility] CM/SW Contact:    Candie Chroman, LCSW Phone Number: 11/30/2019, 8:32 AM  Clinical Narrative: Per weekend TOC handoff from 4/10: "Met with Pt and wife concerning HH for colostomy care. Wife stated Nurse has trained her on how to care for colostomy and she will be taking care of it."                 Expected Discharge Plan: Home/Self Care Barriers to Discharge: Continued Medical Work up   Patient Goals and CMS Choice     Choice offered to / list presented to : NA  Expected Discharge Plan and Services Expected Discharge Plan: Home/Self Care     Post Acute Care Choice: NA Living arrangements for the past 2 months: Single Family Home                                      Prior Living Arrangements/Services Living arrangements for the past 2 months: Single Family Home Lives with:: Spouse Patient language and need for interpreter reviewed:: Yes Do you feel safe going back to the place where you live?: Yes      Need for Family Participation in Patient Care: Yes (Comment) Care giver support system in place?: Yes (comment)   Criminal Activity/Legal Involvement Pertinent to Current Situation/Hospitalization: No - Comment as needed  Activities of Daily Living Home Assistive Devices/Equipment: None ADL Screening (condition at time of admission) Patient's cognitive ability adequate to safely complete daily activities?: Yes Is the patient deaf or have difficulty hearing?: No Does the patient have difficulty seeing, even when wearing glasses/contacts?: No Does the patient have difficulty concentrating, remembering, or making decisions?: No Patient able to express need for assistance with ADLs?: Yes Does the patient have difficulty dressing or bathing?: No Independently performs ADLs?: No Communication:  Independent Dressing (OT): Independent Grooming: Independent Feeding: Independent Bathing: Independent Toileting: Independent In/Out Bed: Independent Walks in Home: Independent Does the patient have difficulty walking or climbing stairs?: No Weakness of Legs: None Weakness of Arms/Hands: None  Permission Sought/Granted Permission sought to share information with : Family Supports    Share Information with NAME: Craig [name]     Permission granted to share info w Relationship: Wife  Permission granted to share info w Contact Information: [phone]  Emotional Assessment Appearance:: Appears stated age     Orientation: : Oriented to Place, Oriented to Self, Oriented to  Time, Oriented to Situation Alcohol / Substance Use: Not Applicable Psych Involvement: No (comment)  Admission diagnosis:  Bowel perforation (HCC) [K63.1] SBO (small bowel obstruction) ([place]) [K56.609] Intra-abdominal abscess ([place]) [K65.1] Diverticulitis of colon with perforation [K57.20] Patient Active Problem List   Diagnosis Date Noted  . Diverticulitis of colon with perforation 11/22/2019   PCP:  Patient, No Pcp Per Pharmacy:   [id number] [place] [id number]: [phone]: [phone]    Social Determinants of Health (SDOH) Interventions    Readmission Risk Interventions No flowsheet data found.

## 2019-11-30 NOTE — Progress Notes (Signed)
[facility] Hospital Day(s): 8.   Post op day(s): 8 Days Post-Op.   Interval History: Patient seen and examined, no acute events or new complaints overnight. Patient reports feeling better today. Denies nausea or vomiting. Reports feeling better from gout pains.   Vital signs in last 24 hours: [min-max] current  Temp:  [98.1 F (36.7 C)-98.8 F (37.1 C)] 98.5 F (36.9 C) (04/12 1352) Pulse Rate:  [97-106] 106 (04/12 1352) Resp:  [18-20] 20 (04/12 1352) BP: (145-169)/(80-105) 147/90 (04/12 1352) SpO2:  [95 %-99 %] 95 % (04/12 1352)     Height: 6\' 1"  (185.4 cm) Weight: 117 kg BMI (Calculated): 34.04   Physical Exam:  Constitutional: alert, cooperative and no distress  Respiratory: breathing non-labored at rest  Cardiovascular: regular rate and sinus rhythm  Gastrointestinal: soft, non-tender, and non-distended. Wound is dry and clean. Ostomy pink and patent.   Labs:  CBC Latest Ref Rng & Units 11/29/2019 11/27/2019 11/25/2019  WBC 4.0 - 10.5 K/uL 17.9(H) 15.3(H) 18.8(H)  Hemoglobin 13.0 - 17.0 g/dL 13.3 12.6(L) 12.4(L)  Hematocrit 39.0 - 52.0 % 40.1 37.3(L) 36.1(L)  Platelets 150 - 400 K/uL 408(H) 366 283   CMP Latest Ref Rng & Units 11/29/2019 11/28/2019 11/27/2019  Glucose 70 - 99 mg/dL 100(H) 86 92  BUN 6 - 20 mg/dL 12 16 19   Creatinine 0.61 - 1.24 mg/dL 0.66 0.54(L) 0.63  Sodium 135 - 145 mmol/L 134(L) 137 139  Potassium 3.5 - 5.1 mmol/L 3.8 4.0 3.2(L)  Chloride 98 - 111 mmol/L 104 105 104  CO2 22 - 32 mmol/L 23 25 26   Calcium 8.9 - 10.3 mg/dL 8.0(L) 7.8(L) 8.1(L)  Total Protein 6.5 - 8.1 g/dL - - -  Total Bilirubin 0.3 - 1.2 mg/dL - - -  Alkaline Phos 38 - 126 U/L - - -  AST 15 - 41 U/L - - -  ALT 0 - 44 U/L - - -    Imaging studies: No new pertinent imaging studies   Assessment/Plan:  28 y.o.malewith perforated diverticulitis8Day Post-Ops/p Hartman's procedure.  Recovering adequately. Tolerating soft diet. Will continue on IV abx therapy.   Gout  pains improving. Will transition to oral therapy. If tolerate will plan on taper slowly.   Encouraged to ambulate.   Arnold Long, MD

## 2019-12-01 MED ORDER — HYDROCODONE-ACETAMINOPHEN 5-325 MG PO TABS: 1.0000 | ORAL_TABLET | ORAL | 0 refills | Status: AC | PRN

## 2019-12-01 MED ORDER — PREDNISONE 20 MG PO TABS
40.0000 mg | ORAL_TABLET | Freq: Every day | ORAL | Status: DC
  Administered 2019-12-01: 40 mg via ORAL
  Filled 2019-12-01: qty 2

## 2019-12-01 MED ORDER — COLCHICINE 0.6 MG PO TABS: 0.6000 mg | ORAL_TABLET | Freq: Every day | ORAL | 0 refills | Status: DC

## 2019-12-01 MED ORDER — PREDNISONE 10 MG PO TABS: 10.0000 mg | ORAL_TABLET | Freq: Every day | ORAL | 0 refills | Status: DC

## 2019-12-01 MED ORDER — ALLOPURINOL 100 MG PO TABS: 100.0000 mg | ORAL_TABLET | Freq: Every day | ORAL | 0 refills | Status: DC

## 2019-12-01 NOTE — Consult Note (Signed)
[place] Nurse ostomy follow up Stoma type/location: LLQ, colostomy Stomal assessment/size: 1 1/2" x 1 3/4" per WOc notes, patient's wife and bedside nurse changed pouch yesterday.  [name] reported she did change with supervision Peristomal assessment: NA Treatment options for stomal/peristomal skin: using 2" barrier ring  Output pasty brown Ostomy pouching: 2pc. 2 3/4" with 2" barrier ring Education provided:  Discussed showering and pouch change schedule Reminded about using wick to clean spout of the pouch after emptying Wife has purchased deodorizing and lubrication drops from [employer], discussed use with her and the patient Discussed risk of hernia and ways to lessen risk Patient has received SS packet at home, discussed use and carrying supplies with the patient Provided [place] catalog and marked items patient is currently using  Provided 4 skin barriers/4 pouches/4 barrier rings and M9 spray for patient DC Enrolled patient in [employer] Discharge program: Yes  Plans for DC today, no HHRN planned. Provided [facility]OC contact info.

## 2019-12-01 NOTE — Consult Note (Signed)
Rheumatology follow-up Still some joint pain.  This morning mainly in both elbows and left thumb MCP and left hand Able to ambulate.  Taking oral reasonably.  Still had to take some steroid  Exam: Left thumb MCP synovitis.  Left hand extensor tendon synovitis.  Right hand mass over the fifth met carpal.  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 elbows without effusion in the olecranon bursa.  Knees without effusion.  Ankles without synovitis  Persistent synovitis migratory.  Appears to be crystalline although uric acid not very elevated.  No clear joints to inject or aspirate.  Recommend discharge on colchicine 0.6 mg 1 p.o. daily, allopurinol 100 mg 1 p.o. daily, and prednisone taper.  30 mg x 1 day, 20 mg x 1 day, 10 mg x 1 day and then stop.  Follow-up with me in 1 to 2 weeks.  He may call me if he has a flare after discharge

## 2019-12-01 NOTE — TOC Progression Note (Signed)
Transition of Care [facility]) - Progression Note    Patient Details  Name: Craig [name] MRN: [id number] Date of Birth: Aug 04, 1991  Transition [facility] Care [facility]) CM/SW Contact  Beverly Sessions, RN Phone Number: 12/01/2019, 9:39 AM  Clinical Narrative:     Per WOC RN note  Secure Start box has arrived at his home  Expected Discharge Plan: Home/Self Care Barriers to Discharge: Continued Medical Work up  Expected Discharge Plan and Services Expected Discharge Plan: Home/Self Care     Post Acute Care Choice: NA Living arrangements for the past 2 months: Single Family Home                                       Social Determinants of Health (SDOH) Interventions    Readmission Risk Interventions No flowsheet data found.

## 2019-12-01 NOTE — Progress Notes (Signed)
Discharge instructions reviewed with the patient and his wife. IV's removed. Patient will be sent out via wheelchair with belongings

## 2019-12-01 NOTE — Discharge Instructions (Signed)
  Diet: Resume home heart healthy regular diet.   Activity: No heavy lifting >20 pounds (children, pets, laundry, garbage) or strenuous activity until follow-up, but light activity and walking are encouraged. Do not drive or drink alcohol if taking narcotic pain medications.  Wound care: May shower with soapy water and pat dry (do not rub incisions), but no baths or submerging incision underwater until follow-up. (no swimming)   Medications: Resume all home medications. For mild to moderate pain: acetaminophen (Tylenol) or ibuprofen (if no kidney disease). Combining Tylenol with alcohol can substantially increase your risk of causing liver disease. Narcotic pain medications, if prescribed, can be used for severe pain, though may cause nausea, constipation, and drowsiness. Do not combine Tylenol and Norco within a 6 hour period as Norco contains Tylenol. If you do not need the narcotic pain medication, you do not need to fill the prescription.  Take prednisone taper as indicated: 30 mg on Wednesday, 20 mg on Thursday and 10 mg on Friday.  Take colchicine and allopurinol as recommended by Dr. Jefm Bryant  Call office [phone]) at any time if any questions, worsening pain, fevers/chills, bleeding, drainage from incision site, or other concerns.

## 2019-12-01 NOTE — Discharge Summary (Signed)
  [name] MRN: [id number] DOB/AGE: Jul 22, 1991 29 y.o.  Admit date: 11/22/2019 Discharge date: 12/01/2019   Discharge Diagnoses:  Active Problems:   Diverticulitis of colon with perforation   Procedures: Partial colectomy with end colostomy  Hospital Course: Patient with perforated diverticulitis. He underwent emergent partial colectomy with end colostomy. He was treated with IV antibiotic therapy. He developed an acute gout attack that did not responded to colchicine and needed to have Solumedrol. Dr. Jefm Bryant evaluated patient and agree with treatment. He is following as outpatient. In the last few days the colostomy is working adequately, having bowel movement. Patient ambulating. Tolerating soft diet. Wife trained how to take care of colostomy.   Physical Exam  Constitutional: He is oriented to person, place, and time and well-developed, well-nourished, and in no distress.  Cardiovascular: Normal rate.  Pulmonary/Chest: Effort normal.  Abdominal: Soft. He exhibits no distension. There is no abdominal tenderness. There is no rebound.  Neurological: He is alert and oriented to person, place, and time.  Skin: Skin is warm.  Colostomy patent. Wound with partial opening on inferior portion with serosanguinous fluid. No erythema.    Consults: Rheumatology (Dr. Jefm Bryant)  Disposition: Discharge disposition: 01-Home or Self Care       Discharge Instructions    Diet - low sodium heart healthy   Complete by: As directed      Allergies as of 12/01/2019      Reactions   Amoxicillin Rash      Medication List    TAKE these medications   acetaminophen 500 MG tablet Commonly known as: TYLENOL Take 1,000 mg by mouth every 6 (six) hours as needed.   allopurinol 100 MG tablet Commonly known as: Zyloprim Take 1 tablet (100 mg total) by mouth daily.   cefdinir 300 MG capsule Commonly known as: OMNICEF Take 300 mg by mouth 2 (two) times daily.    cholecalciferol 25 MCG (1000 UNIT) tablet Commonly known as: VITAMIN D3 Take 1,000 Units by mouth daily.   colchicine 0.6 MG tablet Take 1 tablet (0.6 mg total) by mouth daily.   cyclobenzaprine 10 MG tablet Commonly known as: FLEXERIL Take 10 mg by mouth 3 (three) times daily as needed for muscle spasms.   HYDROcodone-acetaminophen 5-325 MG tablet Commonly known as: Norco Take 1 tablet by mouth every 4 (four) hours as needed for up to 3 days for moderate pain.   ibuprofen 800 MG tablet Commonly known as: ADVIL Take 800 mg by mouth every 8 (eight) hours as needed.   predniSONE 10 MG tablet Commonly known as: DELTASONE Take 1 tablet (10 mg total) by mouth daily. Taper as follow: Take 30 mg (3 pills) tomorrow. Take 20 mg (2 pills) the next day. Take 10 mg (1 pill) the day after.   vitamin C 100 MG tablet Take 100 mg by mouth daily.   zinc gluconate 50 MG tablet Take 50 mg by mouth daily.      Follow-up Information    Herbert Pun, MD Follow up in 1 week(s).   Specialty: General Surgery Contact information: [place] [address] [phone]        Emmaline Kluver., MD Follow up.   Specialty: Rheumatology Why: within 1-2 weeks.  Contact information: [place] [address] [phone]

## 2019-12-02 ENCOUNTER — Ambulatory Visit: Payer: Self-pay | Admitting: General Surgery

## 2019-12-04 LAB — CULTURE, BLOOD (ROUTINE X 2)
Culture: NO GROWTH
Special Requests: ADEQUATE

## 2020-02-17 DIAGNOSIS — M1A00X0 Idiopathic chronic gout, unspecified site, without tophus (tophi): Secondary | ICD-10-CM | POA: Insufficient documentation

## 2020-05-10 ENCOUNTER — Other Ambulatory Visit: Payer: Self-pay

## 2020-05-10 ENCOUNTER — Other Ambulatory Visit
Admission: RE | Admit: 2020-05-10 | Discharge: 2020-05-10 | Disposition: A | Discharge status: Home / Self Care | Payer: No Typology Code available for payment source | Source: Ambulatory Visit | Attending: Surgery | Admitting: Surgery

## 2020-05-10 DIAGNOSIS — Z20822 Contact with and (suspected) exposure to covid-19: Secondary | ICD-10-CM | POA: Insufficient documentation

## 2020-05-10 DIAGNOSIS — Z01812 Encounter for preprocedural laboratory examination: Secondary | ICD-10-CM | POA: Insufficient documentation

## 2020-05-11 ENCOUNTER — Encounter: Payer: Self-pay | Admitting: Surgery

## 2020-05-11 LAB — SARS CORONAVIRUS 2 (TAT 6-24 HRS): SARS Coronavirus 2: NEGATIVE

## 2020-05-12 ENCOUNTER — Encounter: Payer: Self-pay | Admitting: Certified Registered Nurse Anesthetist

## 2020-05-12 ENCOUNTER — Encounter
Admission: RE | Disposition: A | Discharge status: Home / Self Care | Payer: Self-pay | Source: Home / Self Care | Attending: Surgery

## 2020-05-12 ENCOUNTER — Encounter: Payer: Self-pay | Admitting: Surgery

## 2020-05-12 ENCOUNTER — Ambulatory Visit
Admission: RE | Admit: 2020-05-12 | Discharge: 2020-05-12 | Disposition: A | Discharge status: Home / Self Care | Payer: No Typology Code available for payment source | Attending: Surgery | Admitting: Surgery

## 2020-05-12 HISTORY — DX: Perforation of intestine (nontraumatic): K63.1

## 2020-05-12 HISTORY — DX: Essential (primary) hypertension: I10

## 2020-05-12 SURGERY — COLONOSCOPY WITH PROPOFOL
Anesthesia: General

## 2020-05-12 MED ORDER — SODIUM CHLORIDE 0.9 % IV SOLN: INTRAVENOUS | Status: DC

## 2020-05-12 NOTE — H&P (Signed)
HISTORY OF PRESENT ILLNESS:   Craig [name] is a 29 y.o.male patient who comes for follow up of his colostomy status.  The patient reports feeling well. He denies any problem with the colostomy. He reports that he is ready to have the colostomy taken down. He reported he is eating well. He denies any fever or chills. He denies any nausea or vomiting. He denies any pain. There is no pain radiation. There is no alleviating or aggravating factor.  Patient denies history of perforated diverticulitis with Hartman's procedure done on November 22, 2019.   PAST MEDICAL HISTORY:  Past Medical History:  Diagnosis Date  . History of chickenpox  . Hypertension     PAST SURGICAL HISTORY:  Past Surgical History:  Procedure Laterality Date  . EXPLORATORY LAPAROTOMY 11/22/2019  Dr Lesli Albee    MEDICATIONS:  Outpatient Encounter Medications as of 04/05/2020  Medication Sig Dispense Refill  . acetaminophen (TYLENOL) 500 MG tablet Take by mouth as needed  . allopurinoL (ZYLOPRIM) 300 MG tablet Take 1 tablet (300 mg total) by mouth once daily 30 tablet 12  . carvediloL (COREG) 6.25 MG tablet Take 1 tablet (6.25 mg total) by mouth 2 (two) times daily with meals 60 tablet 11  . diphenhydrAMINE (BENADRYL) 25 mg capsule Take 25 mg by mouth every 6 (six) hours as needed for Itching  . ibuprofen (ADVIL,MOTRIN) 200 MG tablet Take 400 mg by mouth every 6 (six) hours as needed for Pain.  Marland Kitchen LORATADINE (CLARITIN ORAL) Take by mouth as needed. Reported on 10/03/2015  . spironolactone (ALDACTONE) 25 MG tablet Take 1 tablet (25 mg total) by mouth once daily 90 tablet 3  . [DISCONTINUED] allopurinoL (ZYLOPRIM) 300 MG tablet Take 1 tablet (300 mg total) by mouth once daily (Patient not taking: Reported on 04/05/2020 ) 30 tablet 5   No facility-administered encounter medications on file as of 04/05/2020.    ALLERGIES:  Amoxicillin  SOCIAL HISTORY:  Social History   Socioeconomic History  . Marital status:  Married  Spouse name: Not on file  . Number of children: Not on file  . Years of education: Not on file  . Highest education level: Not on file  Occupational History  . Not on file  Tobacco Use  . Smoking status: Never Smoker  . Smokeless tobacco: Current User  Types: Chew  Vaping Use  . Vaping Use: Never used  Substance and Sexual Activity  . Alcohol use: Yes  Alcohol/week: 0.0 standard drinks  Comment: socially  . Drug use: Not on file  . Sexual activity: Not on file  Other Topics Concern  . Not on file  Social History Narrative  . Not on file   Social Determinants of Health   Financial Resource Strain:  . Difficulty of Paying Living Expenses:  Food Insecurity:  . Worried About Charity fundraiser in the Last Year:  . Arboriculturist in the Last Year:  Transportation Needs:  . Film/video editor (Medical):  Marland Kitchen Lack of Transportation (Non-Medical):   FAMILY HISTORY:  Family History  Problem Relation Age of Onset  . No Known Problems Mother  . High blood pressure (Hypertension) Father  . Hyperlipidemia (Elevated cholesterol) Father    GENERAL REVIEW OF SYSTEMS:   General ROS: negative for - chills, fatigue, fever, weight gain or weight loss Allergy and Immunology ROS: negative for - hives  Hematological and Lymphatic ROS: negative for - bleeding problems or bruising, negative for palpable nodes Endocrine ROS: negative  for - heat or cold intolerance, hair changes Respiratory ROS: negative for - cough, shortness of breath or wheezing Cardiovascular ROS: no chest pain or palpitations GI ROS: negative for nausea, vomiting, abdominal pain, diarrhea, constipation Musculoskeletal ROS: negative for - joint swelling or muscle pain Neurological ROS: negative for - confusion, syncope Dermatological ROS: negative for pruritus and rash  PHYSICAL EXAM:  Vitals:  04/05/20 1556  BP: (!) 172/109  Pulse: 91  .  Ht:182.9 cm (6') Wt:(!) 123.8 kg (273 lb) MOL:MBEM surface  area is 2.51 meters squared. Body mass index is 37.03 kg/m.Marland Kitchen  GENERAL: Alert, active, oriented x3  HEENT: Pupils equal reactive to light. Extraocular movements are intact. Sclera clear. Palpebral conjunctiva normal red color.Pharynx clear.  NECK: Supple with no palpable mass and no adenopathy.  LUNGS: Sound clear with no rales rhonchi or wheezes.  HEART: Regular rhythm S1 and S2 without murmur.  ABDOMEN: Soft and depressible, nontender with no palpable mass, no hepatomegaly. Colostomy in place in the left lower quadrant. Colostomy pink and patent.  EXTREMITIES: Well-developed well-nourished symmetrical with no dependent edema.  NEUROLOGICAL: Awake alert oriented, facial expression symmetrical, moving all extremities.   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 Patient is very eager to have the colostomy takedown. I will coordinate colonoscopy for further evaluation before surgical management of colostomy takedown. I discussed with the patient the benefit of the colonoscopy. I will coordinate colonoscopy with Dr. [name]. I will see the patient after the colonoscopy for final discussion of colostomy takedown.   PLAN:  1. Diagnostic colonoscopy with Dr. [name] 2. Continue high fiber diet 3. Will see you after colonoscopy for colostomy takedown discussion.   Patient verbalized understanding, all questions were answered, and were agreeable with the plan outlined above.

## 2020-05-12 NOTE — Interval H&P Note (Signed)
History and Physical Interval Note:  05/12/2020 11:30 AM  Craig [name]  has presented today for surgery, with the diagnosis of COLOSTOMY STATUS.  The various methods of treatment have been discussed with the patient and family. After consideration of risks, benefits and other options for treatment, the patient has consented to  Procedure(s): COLONOSCOPY WITH PROPOFOL (N/A) as a surgical intervention.  The patient's history has been reviewed, Pt prep was inadequate, with formed stool still present in the ostomy bag.  He states he had some potato chips yesterday.  I still offered him proceeding with a colonoscopy today since he was here, understanding that there may be a chance that we may need to repeat his colonoscopy due to poor visualization.  Patient requested that we hold off and reschedule.  We will make sure office will reschedule and also reiterated to him the importance of following preop procedure instructions of following a clear liquid diet, and even recommending doing an additional day of clear liquid diet ensure that his colon is cleaned out for the colonoscopy.  Patient verbalized understanding  [name] [name]

## 2020-05-12 NOTE — H&P (View-Only) (Signed)
HISTORY OF PRESENT ILLNESS:   Craig [name] is a 29 y.o.male patient who comes for follow up of his colostomy status.  The patient reports feeling well. He denies any problem with the colostomy. He reports that he is ready to have the colostomy taken down. He reported he is eating well. He denies any fever or chills. He denies any nausea or vomiting. He denies any pain. There is no pain radiation. There is no alleviating or aggravating factor.  Patient denies history of perforated diverticulitis with Hartman's procedure done on November 22, 2019.   PAST MEDICAL HISTORY:  Past Medical History:  Diagnosis Date  . History of chickenpox  . Hypertension     PAST SURGICAL HISTORY:  Past Surgical History:  Procedure Laterality Date  . EXPLORATORY LAPAROTOMY 11/22/2019  Dr Lesli Albee    MEDICATIONS:  Outpatient Encounter Medications as of 04/05/2020  Medication Sig Dispense Refill  . acetaminophen (TYLENOL) 500 MG tablet Take by mouth as needed  . allopurinoL (ZYLOPRIM) 300 MG tablet Take 1 tablet (300 mg total) by mouth once daily 30 tablet 12  . carvediloL (COREG) 6.25 MG tablet Take 1 tablet (6.25 mg total) by mouth 2 (two) times daily with meals 60 tablet 11  . diphenhydrAMINE (BENADRYL) 25 mg capsule Take 25 mg by mouth every 6 (six) hours as needed for Itching  . ibuprofen (ADVIL,MOTRIN) 200 MG tablet Take 400 mg by mouth every 6 (six) hours as needed for Pain.  Marland Kitchen LORATADINE (CLARITIN ORAL) Take by mouth as needed. Reported on 10/03/2015  . spironolactone (ALDACTONE) 25 MG tablet Take 1 tablet (25 mg total) by mouth once daily 90 tablet 3  . [DISCONTINUED] allopurinoL (ZYLOPRIM) 300 MG tablet Take 1 tablet (300 mg total) by mouth once daily (Patient not taking: Reported on 04/05/2020 ) 30 tablet 5   No facility-administered encounter medications on file as of 04/05/2020.    ALLERGIES:  Amoxicillin  SOCIAL HISTORY:  Social History   Socioeconomic History  . Marital status:  Married  Spouse name: Not on file  . Number of children: Not on file  . Years of education: Not on file  . Highest education level: Not on file  Occupational History  . Not on file  Tobacco Use  . Smoking status: Never Smoker  . Smokeless tobacco: Current User  Types: Chew  Vaping Use  . Vaping Use: Never used  Substance and Sexual Activity  . Alcohol use: Yes  Alcohol/week: 0.0 standard drinks  Comment: socially  . Drug use: Not on file  . Sexual activity: Not on file  Other Topics Concern  . Not on file  Social History Narrative  . Not on file   Social Determinants of Health   Financial Resource Strain:  . Difficulty of Paying Living Expenses:  Food Insecurity:  . Worried About Charity fundraiser in the Last Year:  . Arboriculturist in the Last Year:  Transportation Needs:  . Film/video editor (Medical):  Marland Kitchen Lack of Transportation (Non-Medical):   FAMILY HISTORY:  Family History  Problem Relation Age of Onset  . No Known Problems Mother  . High blood pressure (Hypertension) Father  . Hyperlipidemia (Elevated cholesterol) Father    GENERAL REVIEW OF SYSTEMS:   General ROS: negative for - chills, fatigue, fever, weight gain or weight loss Allergy and Immunology ROS: negative for - hives  Hematological and Lymphatic ROS: negative for - bleeding problems or bruising, negative for palpable nodes Endocrine ROS: negative  for - heat or cold intolerance, hair changes Respiratory ROS: negative for - cough, shortness of breath or wheezing Cardiovascular ROS: no chest pain or palpitations GI ROS: negative for nausea, vomiting, abdominal pain, diarrhea, constipation Musculoskeletal ROS: negative for - joint swelling or muscle pain Neurological ROS: negative for - confusion, syncope Dermatological ROS: negative for pruritus and rash  PHYSICAL EXAM:  Vitals:  04/05/20 1556  BP: (!) 172/109  Pulse: 91  .  Ht:182.9 cm (6') Wt:(!) 123.8 kg (273 lb) ZDG:UYQI surface  area is 2.51 meters squared. Body mass index is 37.03 kg/m.Marland Kitchen  GENERAL: Alert, active, oriented x3  HEENT: Pupils equal reactive to light. Extraocular movements are intact. Sclera clear. Palpebral conjunctiva normal red color.Pharynx clear.  NECK: Supple with no palpable mass and no adenopathy.  LUNGS: Sound clear with no rales rhonchi or wheezes.  HEART: Regular rhythm S1 and S2 without murmur.  ABDOMEN: Soft and depressible, nontender with no palpable mass, no hepatomegaly. Colostomy in place in the left lower quadrant. Colostomy pink and patent.  EXTREMITIES: Well-developed well-nourished symmetrical with no dependent edema.  NEUROLOGICAL: Awake alert oriented, facial expression symmetrical, moving all extremities.   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 Patient is very eager to have the colostomy takedown. I will coordinate colonoscopy for further evaluation before surgical management of colostomy takedown. I discussed with the patient the benefit of the colonoscopy. I will coordinate colonoscopy with Dr. [name]. I will see the patient after the colonoscopy for final discussion of colostomy takedown.   PLAN:  1. Diagnostic colonoscopy with Dr. [name] 2. Continue high fiber diet 3. Will see you after colonoscopy for colostomy takedown discussion.   Patient verbalized understanding, all questions were answered, and were agreeable with the plan outlined above.

## 2020-05-12 NOTE — Progress Notes (Signed)
Patient not clean. The office will call patient to reschedule

## 2020-05-14 ENCOUNTER — Other Ambulatory Visit: Payer: Self-pay

## 2020-05-14 ENCOUNTER — Ambulatory Visit (INDEPENDENT_AMBULATORY_CARE_PROVIDER_SITE_OTHER): Payer: No Typology Code available for payment source

## 2020-05-14 ENCOUNTER — Ambulatory Visit
Admission: RE | Admit: 2020-05-14 | Discharge: 2020-05-14 | Disposition: A | Discharge status: Home / Self Care | Payer: No Typology Code available for payment source | Source: Ambulatory Visit

## 2020-05-14 VITALS — BP 157/97 | HR 82 | Temp 98.3°F | Resp 16 | Ht 72.0 in | Wt 270.0 lb

## 2020-05-14 DIAGNOSIS — M79641 Pain in right hand: Secondary | ICD-10-CM | POA: Diagnosis not present

## 2020-05-14 DIAGNOSIS — M10441 Other secondary gout, right hand: Secondary | ICD-10-CM

## 2020-05-14 DIAGNOSIS — M7989 Other specified soft tissue disorders: Secondary | ICD-10-CM

## 2020-05-14 DIAGNOSIS — M79643 Pain in unspecified hand: Secondary | ICD-10-CM

## 2020-05-14 DIAGNOSIS — S6991XA Unspecified injury of right wrist, hand and finger(s), initial encounter: Secondary | ICD-10-CM

## 2020-05-14 IMAGING — CR DG HAND COMPLETE 3+V*R*
3 series · 3 of 3 positions shown · non-contrast
Comparison: None.

CLINICAL DATA: Pain and swelling for 2 days following blunt trauma
1 week ago, initial encounter

EXAM:
RIGHT HAND - COMPLETE 3+ VIEW

[hand ap]
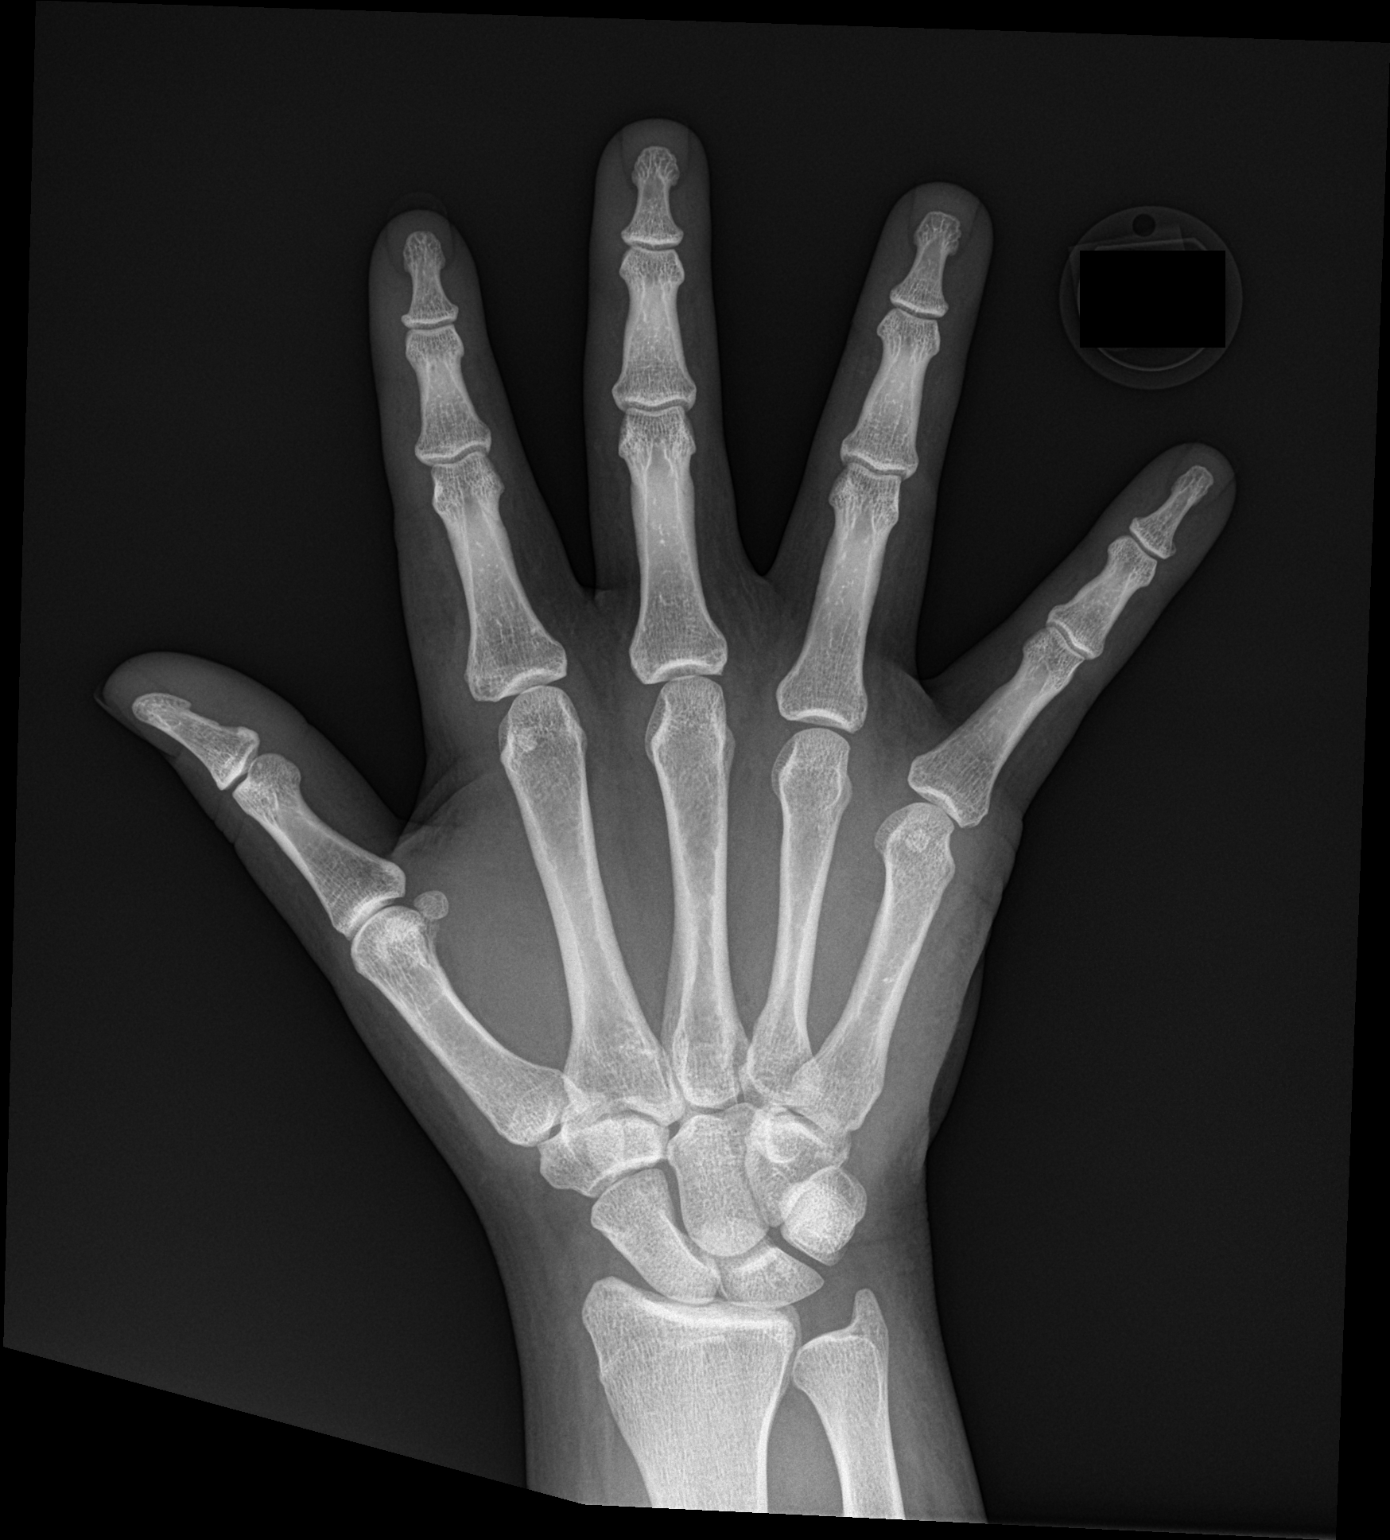

[hand obl]
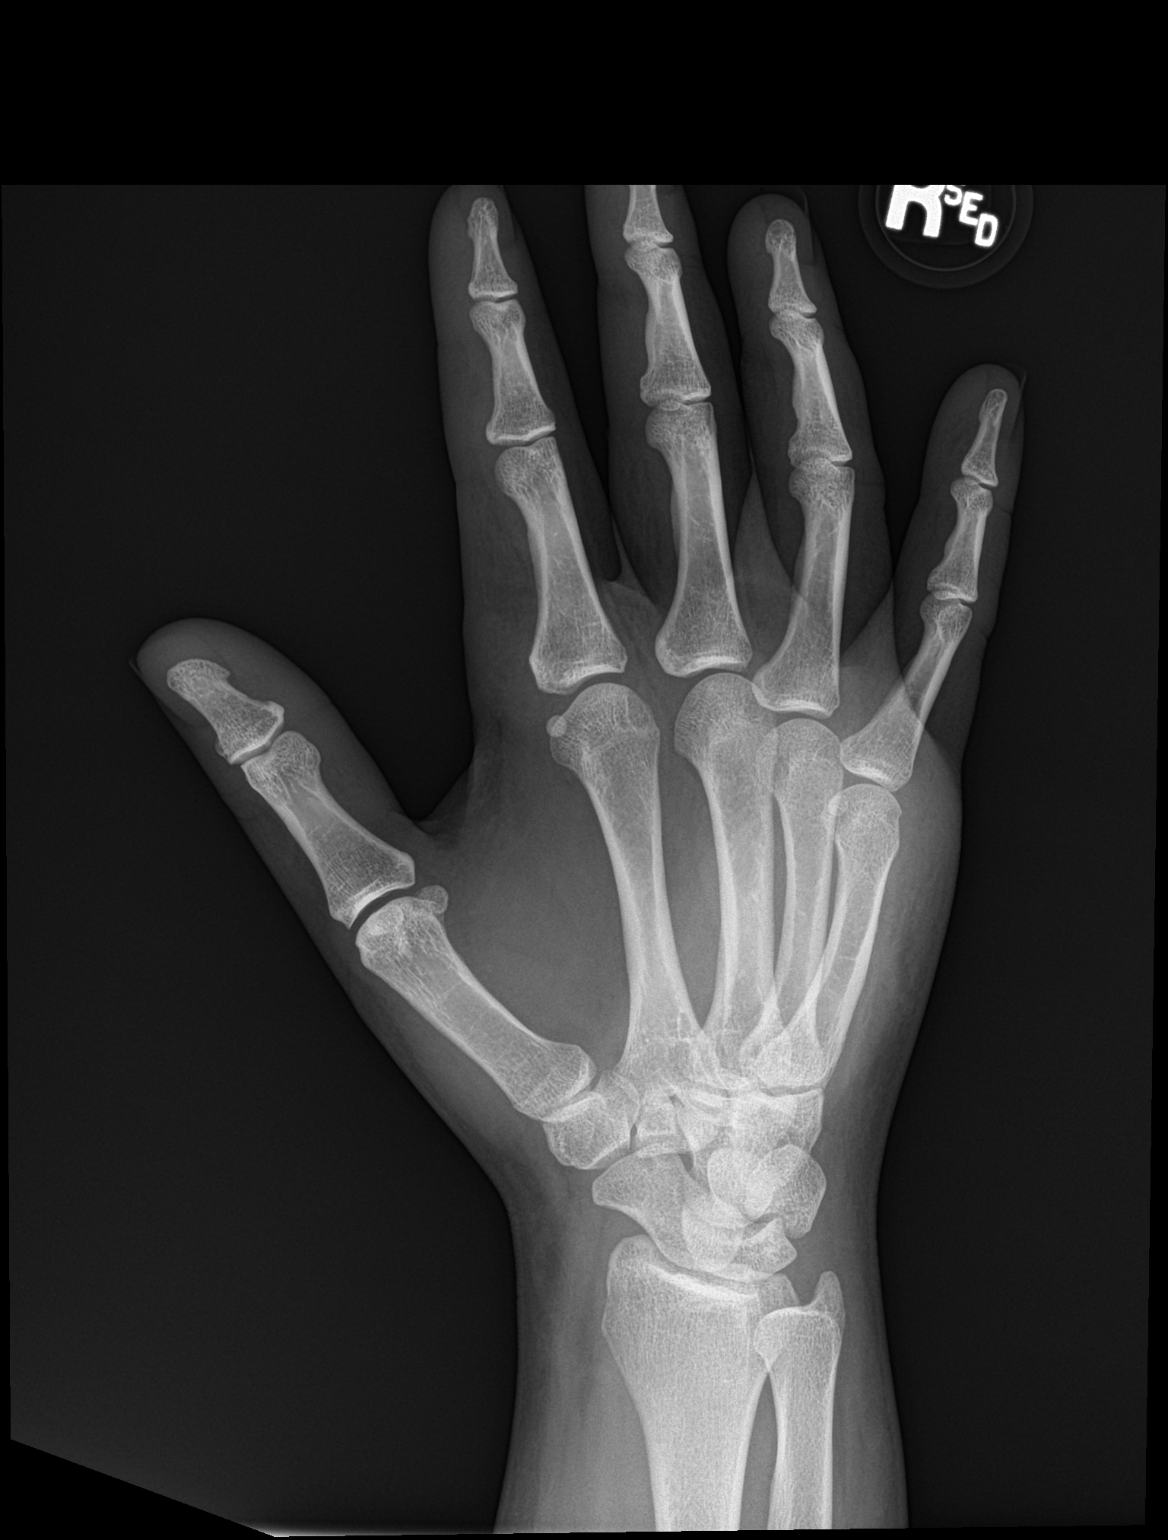

[hand lat]
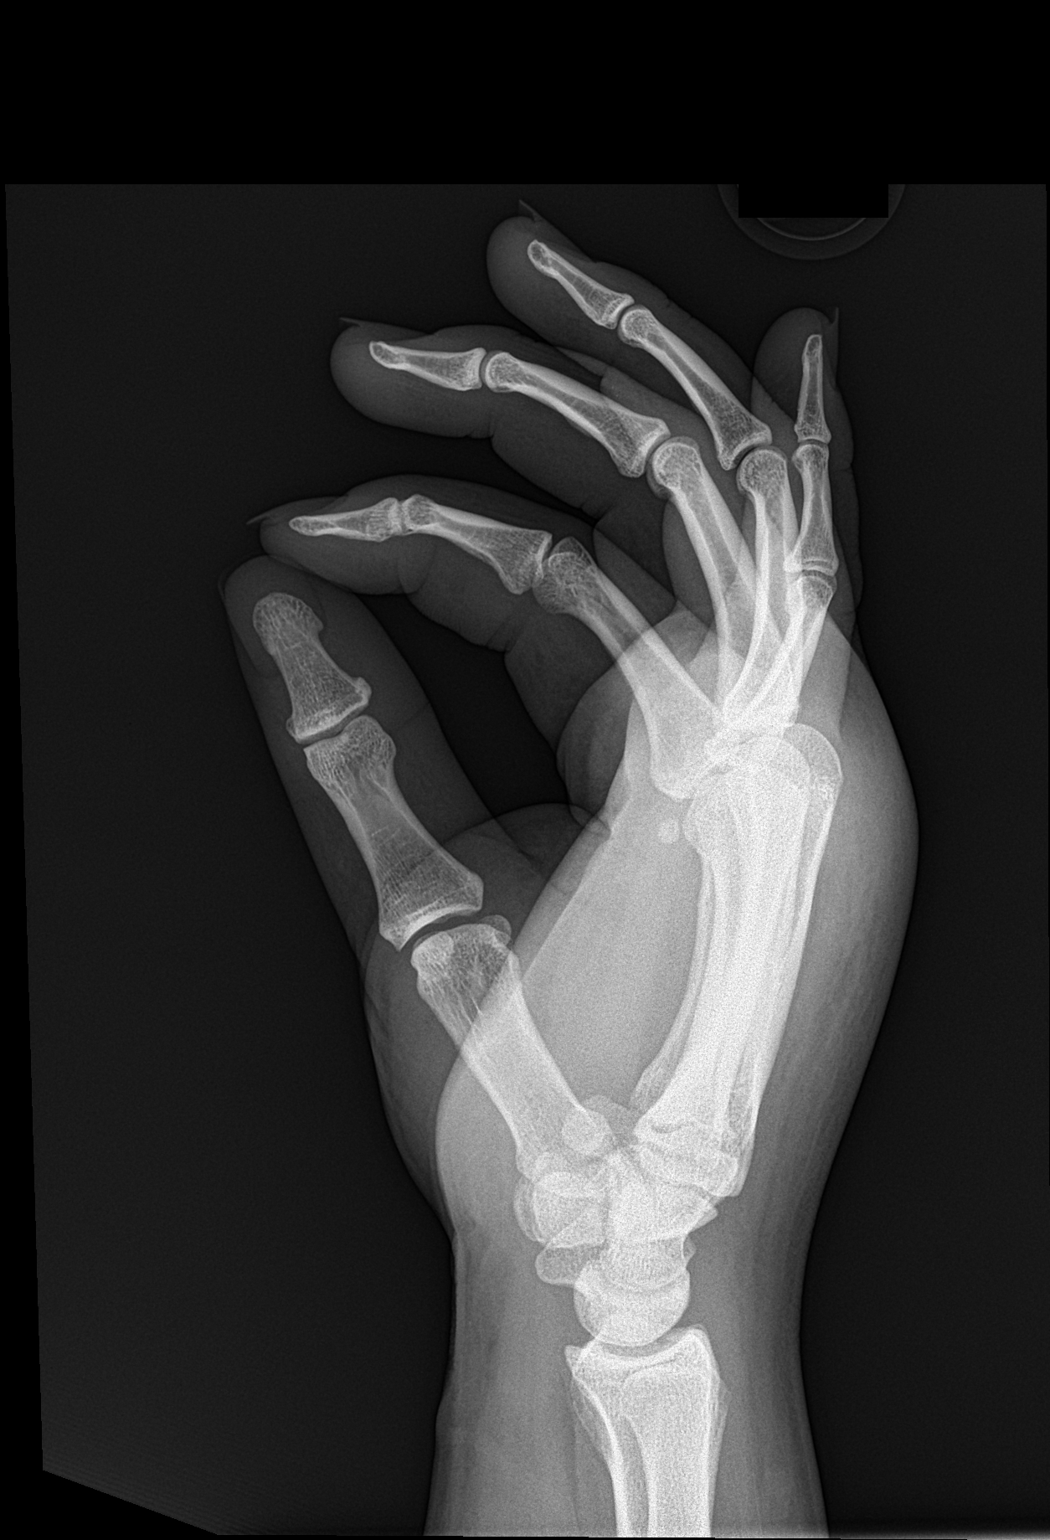

[3 of 3 positions shown; findings below may reference images not displayed]

FINDINGS: Mild soft tissue swelling is noted. No acute fracture or dislocation
is seen. No radiopaque foreign body is noted.
IMPRESSION: Soft tissue swelling without acute bony abnormality.

## 2020-05-14 MED ORDER — PREDNISONE 10 MG (21) PO TBPK: ORAL_TABLET | ORAL | 0 refills | Status: DC

## 2020-05-14 NOTE — ED Triage Notes (Signed)
Patient c/o redness, swelling, and pain in his right hand that started yesterday.  Patient states that he hit his right hand on the frame of his truck a week ago.

## 2020-05-14 NOTE — ED Provider Notes (Signed)
[facility]    CSN: [id number] Arrival date & time: 05/14/20  1359      History   Chief Complaint Chief Complaint  Patient presents with  . Hand Pain    right  . Appointment    HPI Craig [name] is a 29 y.o. male who presents with redness and swelling of his R hand since yesterday. He hit this hand on his truck frame when he was working on it, but did not cut it and has been fine the whole week. Then this am his hand got swollen and very tender and feels like he is having a gout flair. States he drinks a lot on the weekends and 2-3 beers a day as well. He does not have diet restrictions to prevent gout. Also has been taking his Allopurinol daily. Denies fever, chills or sweats.      Past Medical History:  Diagnosis Date  . Hypertension   . Rupture of bowel [facility])     Patient Active Problem List   Diagnosis Date Noted  . Diverticulitis of colon with perforation 11/22/2019    Past Surgical History:  Procedure Laterality Date  . COLOSTOMY N/A 11/22/2019   Procedure: COLOSTOMY;  Surgeon: Herbert Pun, MD;  Location: [facility] ORS;  Service: General;  Laterality: N/A;  . LAPAROTOMY N/A 11/22/2019   Procedure: EXPLORATORY LAPAROTOMY;  Surgeon: Herbert Pun, MD;  Location: [facility] ORS;  Service: General;  Laterality: N/A;  . PARTIAL COLECTOMY N/A 11/22/2019   Procedure: PARTIAL COLECTOMY;  Surgeon: Herbert Pun, MD;  Location: [facility] ORS;  Service: General;  Laterality: N/A;       Home Medications    Prior to Admission medications   Medication Sig Start Date End Date Taking? Authorizing Provider  allopurinol (ZYLOPRIM) 100 MG tablet Take 1 tablet (100 mg total) by mouth daily. 12/01/19 05/14/20 Yes Herbert Pun, MD  carvedilol (COREG) 6.25 MG tablet Take 6.25 mg by mouth 2 (two) times daily with a meal.   Yes [provider]  colchicine 0.6 MG tablet Take 1 tablet (0.6 mg total) by mouth daily. 12/01/19 05/14/20 Yes  Herbert Pun, MD  ibuprofen (ADVIL) 800 MG tablet Take 800 mg by mouth every 8 (eight) hours as needed.   Yes [provider]  loratadine (CLARITIN) 10 MG tablet Take 10 mg by mouth daily.   Yes [provider]  acetaminophen (TYLENOL) 500 MG tablet Take 1,000 mg by mouth every 6 (six) hours as needed.    [provider]  Ascorbic Acid (VITAMIN C) 100 MG tablet Take 100 mg by mouth daily.    [provider]  cholecalciferol (VITAMIN D3) 25 MCG (1000 UNIT) tablet Take 1,000 Units by mouth daily.    [provider]  cyclobenzaprine (FLEXERIL) 10 MG tablet Take 10 mg by mouth 3 (three) times daily as needed for muscle spasms. Patient not taking: Reported on 05/12/2020    [provider]  zinc gluconate 50 MG tablet Take 50 mg by mouth daily.    [provider]    Family History Family History  Problem Relation Age of Onset  . Healthy Mother   . Healthy Father     Social History Social History   Tobacco Use  . Smoking status: Never Smoker  . Smokeless tobacco: Current User    Types: Chew  Vaping Use  . Vaping Use: Never used  Substance Use Topics  . Alcohol use: Yes    Comment: pint to a fifth per day  per patient   . Drug use: Never     Allergies   Amoxicillin   Review of Systems Review of Systems  Constitutional: Negative for chills, diaphoresis and fever.  Musculoskeletal: Positive for joint swelling. Negative for myalgias.  Skin: Negative for color change, rash and wound.     Physical Exam Triage Vital Signs ED Triage Vitals  Enc Vitals Group     BP 05/14/20 1449 (!) 186/113     Pulse Rate 05/14/20 1449 95     Resp 05/14/20 1449 16     Temp 05/14/20 1449 98.3 F (36.8 C)     Temp Source 05/14/20 1449 Oral     SpO2 05/14/20 1449 98 %     Weight 05/14/20 1445 270 lb (122.5 kg)     Height 05/14/20 1445 6' (1.829 m)     Head Circumference --      Peak Flow --      Pain Score 05/14/20 1445 7      Pain Loc --      Pain Edu? --      Excl. in [place]? --    No data found.  Updated Vital Signs BP (!) 186/113 (BP Location: Left Arm)   Pulse 95   Temp 98.3 F (36.8 C) (Oral)   Resp 16   Ht 6' (1.829 m)   Wt 270 lb (122.5 kg)   SpO2 98%   BMI 36.62 kg/m   Visual Acuity Right Eye Distance:   Left Eye Distance:   Bilateral Distance:    Right Eye Near:   Left Eye Near:    Bilateral Near:     Physical Exam Vitals and nursing note reviewed.  Constitutional:      General: He is not in acute distress.    Appearance: He is obese. He is not ill-appearing or toxic-appearing.  HENT:     Right Ear: External ear normal.     Left Ear: External ear normal.  Eyes:     Conjunctiva/sclera: Conjunctivae normal.  Cardiovascular:     Rate and Rhythm: Normal rate and regular rhythm.     Heart sounds: No murmur heard.   Pulmonary:     Effort: Pulmonary effort is normal.     Breath sounds: Normal breath sounds.  Musculoskeletal:        General: Swelling and tenderness present. No deformity.     Cervical back: Neck supple.     Comments: R HAND- with moderate swelling over dorsal hand from middle knuckle to little one and 2/3 of his metacarpals. Has point tenderness on the 4th knuckle with no crepitations. There is warmth and mild erythema on area of pain and swelling. No open wounds noted. ROM of fingers are normal.   Skin:    General: Skin is warm and dry.  Neurological:     Mental Status: He is alert and oriented to person, place, and time.     Gait: Gait normal.  Psychiatric:        Mood and Affect: Mood normal.        Behavior: Behavior normal.        Thought Content: Thought content normal.        Judgment: Judgment normal.      UC Treatments / Results  Labs (all labs ordered are listed, but only abnormal results are displayed) Labs Reviewed - No data to display  EKG   Radiology No results found.  Procedures Procedures (including critical care  time)  Medications Ordered  in UC Medications - No data to display  Initial Impression / Assessment and Plan / UC Course  I have reviewed the triage vital signs and the nursing notes. Strong suspicion he is having a gout flair since he has had it on his hand while in the hospital this year. I placed him on prednisone dose pack. Needs to cut down his drinking to max 14 drinks per day, but is his BP is still 140/90, then needs to d/c drinking.  See instructions.  Final Clinical Impressions(s) / UC Diagnoses   Final diagnoses:  Hand pain   Discharge Instructions   None    ED Prescriptions    None     PDMP not reviewed this encounter.   Shelby Mattocks, PA-C 05/14/20 1547

## 2020-05-16 ENCOUNTER — Other Ambulatory Visit
Admission: RE | Admit: 2020-05-16 | Discharge: 2020-05-16 | Disposition: A | Discharge status: Home / Self Care | Payer: No Typology Code available for payment source | Source: Ambulatory Visit | Attending: Surgery | Admitting: Surgery

## 2020-05-16 ENCOUNTER — Other Ambulatory Visit: Payer: Self-pay

## 2020-05-16 DIAGNOSIS — Z20822 Contact with and (suspected) exposure to covid-19: Secondary | ICD-10-CM | POA: Insufficient documentation

## 2020-05-16 DIAGNOSIS — Z01812 Encounter for preprocedural laboratory examination: Secondary | ICD-10-CM | POA: Diagnosis not present

## 2020-05-17 ENCOUNTER — Other Ambulatory Visit: Payer: No Typology Code available for payment source

## 2020-05-17 LAB — SARS CORONAVIRUS 2 (TAT 6-24 HRS): SARS Coronavirus 2: NEGATIVE

## 2020-05-18 ENCOUNTER — Ambulatory Visit
Admission: RE | Admit: 2020-05-18 | Discharge: 2020-05-18 | Disposition: A | Discharge status: Home / Self Care | Payer: No Typology Code available for payment source | Attending: Surgery | Admitting: Surgery

## 2020-05-18 ENCOUNTER — Ambulatory Visit: Payer: No Typology Code available for payment source | Admitting: Certified Registered Nurse Anesthetist

## 2020-05-18 ENCOUNTER — Encounter: Payer: Self-pay | Admitting: Surgery

## 2020-05-18 ENCOUNTER — Ambulatory Visit
Admission: RE | Admit: 2020-05-18 | Payer: No Typology Code available for payment source | Source: Home / Self Care | Admitting: Surgery

## 2020-05-18 ENCOUNTER — Encounter
Admission: RE | Disposition: A | Discharge status: Home / Self Care | Payer: Self-pay | Source: Home / Self Care | Attending: Surgery

## 2020-05-18 ENCOUNTER — Encounter: Admission: RE | Payer: Self-pay | Source: Home / Self Care

## 2020-05-18 ENCOUNTER — Other Ambulatory Visit: Payer: Self-pay

## 2020-05-18 DIAGNOSIS — D123 Benign neoplasm of transverse colon: Secondary | ICD-10-CM | POA: Diagnosis not present

## 2020-05-18 DIAGNOSIS — Z8249 Family history of ischemic heart disease and other diseases of the circulatory system: Secondary | ICD-10-CM | POA: Insufficient documentation

## 2020-05-18 DIAGNOSIS — Z933 Colostomy status: Secondary | ICD-10-CM | POA: Insufficient documentation

## 2020-05-18 DIAGNOSIS — Z8619 Personal history of other infectious and parasitic diseases: Secondary | ICD-10-CM | POA: Diagnosis not present

## 2020-05-18 DIAGNOSIS — Z79899 Other long term (current) drug therapy: Secondary | ICD-10-CM | POA: Insufficient documentation

## 2020-05-18 DIAGNOSIS — I10 Essential (primary) hypertension: Secondary | ICD-10-CM | POA: Diagnosis not present

## 2020-05-18 DIAGNOSIS — Z880 Allergy status to penicillin: Secondary | ICD-10-CM | POA: Diagnosis not present

## 2020-05-18 HISTORY — DX: Gout, unspecified: M10.9

## 2020-05-18 HISTORY — PX: COLONOSCOPY WITH PROPOFOL: SHX5780

## 2020-05-18 SURGERY — Surgical Case
Anesthesia: *Unknown

## 2020-05-18 SURGERY — COLONOSCOPY WITH PROPOFOL
Anesthesia: General | Site: Rectum

## 2020-05-18 SURGERY — COLONOSCOPY WITH PROPOFOL
Anesthesia: General

## 2020-05-18 MED ORDER — PROPOFOL 500 MG/50ML IV EMUL
INTRAVENOUS | Status: DC | PRN
  Administered 2020-05-18: 150 ug/kg/min via INTRAVENOUS

## 2020-05-18 MED ORDER — LIDOCAINE HCL (CARDIAC) PF 100 MG/5ML IV SOSY
PREFILLED_SYRINGE | INTRAVENOUS | Status: DC | PRN
  Administered 2020-05-18: 50 mg via INTRAVENOUS

## 2020-05-18 MED ORDER — SODIUM CHLORIDE 0.9 % IV SOLN: INTRAVENOUS | Status: DC

## 2020-05-18 MED ORDER — PROPOFOL 10 MG/ML IV BOLUS
INTRAVENOUS | Status: DC | PRN
  Administered 2020-05-18: 70 mg via INTRAVENOUS
  Administered 2020-05-18 (×2): 40 mg via INTRAVENOUS
  Administered 2020-05-18 (×2): 30 mg via INTRAVENOUS

## 2020-05-18 NOTE — Transfer of Care (Signed)
Immediate Anesthesia Transfer of Care Note  Patient: Craig [name]  Procedure(s) Performed: COLONOSCOPY WITH PROPOFOL (N/A )  Patient Location: PACU  Anesthesia Type:General  Level of Consciousness: awake, alert  and oriented  Airway & Oxygen Therapy: Patient Spontanous Breathing  Post-op Assessment: Report given to RN and Post -op Vital signs reviewed and stable  Post vital signs: Reviewed and stable  Last Vitals:  Vitals Value Taken Time  BP 155/104 05/18/20 1048  Temp 36.3 C 05/18/20 1046  Pulse 81 05/18/20 1057  Resp 19 05/18/20 1057  SpO2 100 % 05/18/20 1057  Vitals shown include unvalidated device data.  Last Pain:  Vitals:   05/18/20 1046  TempSrc: Temporal  PainSc: 0-No pain         Complications: No complications documented.

## 2020-05-18 NOTE — Op Note (Addendum)
[facility] Gastroenterology Patient Name: Hafiz Irion Procedure Date: 05/18/2020 10:14 AM MRN: [id number] Account #: [id number] Date of Birth: Sep 03, 1990 Admit Type: Outpatient Age: 29 Room: [facility] ENDO ROOM 1 Gender: Male Note Status: Finalized Procedure:             Colonoscopy via Stoma with Endoscopy of Hartmann Pouch Indications:           colostomy status Providers:             Eliseo Squires MD, MD Referring MD:          Ocie Cornfield. Ouida Sills MD, MD (Referring MD) Medicines:             Propofol per Anesthesia Complications:         No immediate complications. Procedure:             Pre-Anesthesia Assessment:                        - After reviewing the risks and benefits, the patient                         was deemed in satisfactory condition to undergo the                         procedure in an ambulatory setting.                        After obtaining informed consent, the endoscope was                         passed under direct vision. Throughout the procedure,                         the patient's blood pressure, pulse, and oxygen                         saturations were monitored continuously. The                         Colonoscope was introduced through the descending                         colostomy and advanced to the the cecum, identified by                         the ileocecal valve. After obtaining informed consent,                         the endoscope was passed under direct vision.                         Throughout the procedure, the patient's blood                         pressure, pulse, and oxygen saturations were monitored                         continuously.The colonoscopy was performed without  difficulty. The patient tolerated the procedure well.                         The quality of the bowel preparation was fair. The                         Colonoscope was introduced through the anus and                          advanced to the surgical stoma. Findings:      The perianal and digital rectal examinations were normal.      A 3 mm polyp was found in the mid transverse colon. The polyp was       sessile. This was biopsied with a cold forceps for histology. Estimated       blood loss was minimal.      The exam was otherwise without abnormality.      The Hartmann pouch appeared normal. Impression:            - Preparation of the colon was fair.                        - One 3 mm polyp in the mid transverse colon. Biopsied.                        - The examination was otherwise normal.                        - The Hartmann pouch is normal. Recommendation:        - Await pathology results.                        - Discharge patient to home.                        - Resume regular diet. Procedure Code(s):     --- Professional ---                        [phone], Colonoscopy, flexible; diagnostic, including                         collection of specimen(s) by brushing or washing, when                         performed (separate procedure)                        [id number], Colonoscopy through stoma; with biopsy, single                         or multiple Diagnosis Code(s):     --- Professional ---                        K63.5, Polyp of colon CPT copyright 2019 American Medical Association. All rights reserved. The codes documented in this report are preliminary and upon coder review may  be revised to meet current compliance requirements. Dr. Sheppard Penton, MD Eliseo Squires MD, MD 05/18/2020 10:53:55 AM This report has been signed electronically. Number of Addenda: 0 Note  Initiated On: 05/18/2020 10:14 AM Scope Withdrawal Time: 0 hours 6 minutes 48 seconds  Total Procedure Duration: 0 hours 17 minutes 10 seconds  Estimated Blood Loss:  Estimated blood loss was minimal.      [facility]

## 2020-05-18 NOTE — Anesthesia Procedure Notes (Signed)
Date/Time: 05/18/2020 10:16 AM Performed by: Johnna Acosta, CRNA Pre-anesthesia Checklist: Patient identified, Emergency Drugs available, Suction available, Patient being monitored and Timeout performed Patient Re-evaluated:Patient Re-evaluated prior to induction Oxygen Delivery Method: Supernova nasal CPAP Preoxygenation: Pre-oxygenation with 100% oxygen Induction Type: IV induction

## 2020-05-18 NOTE — Interval H&P Note (Signed)
History and Physical Interval Note:  05/18/2020 9:59 AM  Craig [name]  has presented today for surgery, with the diagnosis of COLOSTOMY STATUS.  The various methods of treatment have been discussed with the patient and family. After consideration of risks, benefits and other options for treatment, the patient has consented to  Procedure(s): COLONOSCOPY WITH PROPOFOL (N/A) as a surgical intervention.  The patient's history has been reviewed, patient examined, no change in status, stable for surgery.  Stool output still dark, but lighter than last time.  He wishes to proceed today.  I have reviewed the patient's chart and labs.  Questions were answered to the patient's satisfaction.     Eugine Bubb [name]

## 2020-05-18 NOTE — Anesthesia Preprocedure Evaluation (Signed)
Anesthesia Evaluation  Patient identified by MRN, date of birth, ID band Patient awake    Reviewed: Allergy & Precautions, H&P , NPO status , Patient's Chart, lab work & pertinent test results, reviewed documented beta blocker date and time   Airway Mallampati: III  TM Distance: <3 FB Neck ROM: full    Dental  (+) Poor Dentition, Teeth Intact   Pulmonary neg pulmonary ROS,    Pulmonary exam normal        Cardiovascular Exercise Tolerance: Good hypertension, On Medications negative cardio ROS Normal cardiovascular exam Rhythm:regular Rate:Normal     Neuro/Psych negative neurological ROS  negative psych ROS   GI/Hepatic negative GI ROS, Neg liver ROS,   Endo/Other  Morbid obesity  Renal/GU negative Renal ROS  negative genitourinary   Musculoskeletal   Abdominal   Peds  Hematology negative hematology ROS (+)   Anesthesia Other Findings Past Medical History: No date: Gout No date: Hypertension No date: Rupture of bowel [facility]) Past Surgical History: 11/22/2019: COLOSTOMY; N/A     Comment:  Procedure: COLOSTOMY;  Surgeon: Herbert Pun,               MD;  Location: [facility] ORS;  Service: General;  Laterality:               N/A; 11/22/2019: LAPAROTOMY; N/A     Comment:  Procedure: EXPLORATORY LAPAROTOMY;  Surgeon:               Herbert Pun, MD;  Location: [facility] ORS;  Service:              General;  Laterality: N/A; 11/22/2019: PARTIAL COLECTOMY; N/A     Comment:  Procedure: PARTIAL COLECTOMY;  Surgeon: Herbert Pun, MD;  Location: [facility] ORS;  Service: General;                Laterality: N/A; BMI    Body Mass Index: 36.62 kg/m     Reproductive/Obstetrics negative OB ROS                             Anesthesia Physical Anesthesia Plan  ASA: III  Anesthesia Plan: General   Post-op Pain Management:    Induction:   PONV Risk Score and Plan:   Airway  Management Planned:   Additional Equipment:   Intra-op Plan:   Post-operative Plan:   Informed Consent: I have reviewed the patients History and Physical, chart, labs and discussed the procedure including the risks, benefits and alternatives for the proposed anesthesia with the patient or authorized representative who has indicated his/her understanding and acceptance.     Dental Advisory Given  Plan Discussed with: CRNA  Anesthesia Plan Comments:         Anesthesia Quick Evaluation

## 2020-05-19 ENCOUNTER — Encounter: Payer: Self-pay | Admitting: Surgery

## 2020-05-19 LAB — SURGICAL PATHOLOGY

## 2020-05-22 NOTE — Anesthesia Postprocedure Evaluation (Signed)
Anesthesia Post Note  Patient: Craig [name]  Procedure(s) Performed: COLONOSCOPY WITH PROPOFOL (N/A )  Patient location during evaluation: PACU Anesthesia Type: General Level of consciousness: awake and alert Pain management: pain level controlled Vital Signs Assessment: post-procedure vital signs reviewed and stable Respiratory status: spontaneous breathing, nonlabored ventilation, respiratory function stable and patient connected to nasal cannula oxygen Cardiovascular status: blood pressure returned to baseline and stable Postop Assessment: no apparent nausea or vomiting Anesthetic complications: no   No complications documented.   Last Vitals:  Vitals:   05/18/20 1057 05/18/20 1109  BP: (!) 164/95 (!) 159/94  Pulse: 81   Resp: (!) 29   Temp:    SpO2: 100%     Last Pain:  Vitals:   05/18/20 1109  TempSrc:   PainSc: 0-No pain                 Molli Barrows

## 2020-05-26 ENCOUNTER — Ambulatory Visit: Payer: Self-pay | Admitting: General Surgery

## 2020-05-26 NOTE — H&P (Signed)
HISTORY OF PRESENT ILLNESS:    Craig [name] is a 29 y.o.male patient who comes for preoperative evaluation for colostomy takedown.  Patient with history of perforated diverticulitis s/p partial colectomy with end colostomy.  The patient reported that he is doing well.  He is tolerating diet.  He denies any problem with the ostomy.  There is no pain radiation.  There is no alleviating or grading factors.  Patient had a colonoscopy last week that shows a very small polyp that was found been tubular adenoma.  This was completely resected.  Patient denies any problem with gout.  He has been under control under the management of the Dr. Jefm Bryant.      PAST MEDICAL HISTORY:      Past Medical History:  Diagnosis Date  . History of chickenpox   . Hypertension         PAST SURGICAL HISTORY:        Past Surgical History:  Procedure Laterality Date  . EXPLORATORY LAPAROTOMY  11/22/2019   Dr Lesli Albee         MEDICATIONS:  Encounter Medications        Outpatient Encounter Medications as of 05/24/2020  Medication Sig Dispense Refill  . acetaminophen (TYLENOL) 500 MG tablet Take by mouth as needed    . allopurinoL (ZYLOPRIM) 300 MG tablet Take 1 tablet (300 mg total) by mouth once daily 30 tablet 12  . carvediloL (COREG) 12.5 MG tablet Take 1 tablet (12.5 mg total) by mouth 2 (two) times daily with meals 60 tablet 11  . diphenhydrAMINE (BENADRYL) 25 mg capsule Take 25 mg by mouth every 6 (six) hours as needed for Itching    . ibuprofen (ADVIL,MOTRIN) 200 MG tablet Take 400 mg by mouth every 6 (six) hours as needed for Pain.    Marland Kitchen LORATADINE (CLARITIN ORAL) Take by mouth as needed. Reported on 10/03/2015     . spironolactone (ALDACTONE) 25 MG tablet Take 1 tablet (25 mg total) by mouth once daily 90 tablet 3   No facility-administered encounter medications on file as of 05/24/2020.       ALLERGIES:   Amoxicillin   SOCIAL HISTORY:  Social History           Socioeconomic History  . Marital status: Married    Spouse name: Not on file  . Number of children: Not on file  . Years of education: Not on file  . Highest education level: Not on file  Occupational History  . Not on file  Tobacco Use  . Smoking status: Never Smoker  . Smokeless tobacco: Current User    Types: Chew  . Tobacco comment: dip  Vaping Use  . Vaping Use: Never used  Substance and Sexual Activity  . Alcohol use: Yes    Alcohol/week: 0.0 standard drinks    Comment: socially  . Drug use: Not on file  . Sexual activity: Not on file  Other Topics Concern  . Not on file  Social History Narrative  . Not on file   Social Determinants of Health      Financial Resource Strain:   . Difficulty of Paying Living Expenses:   Food Insecurity:   . Worried About Charity fundraiser in the Last Year:   . Arboriculturist in the Last Year:   Transportation Needs:   . Film/video editor (Medical):   Marland Kitchen Lack of Transportation (Non-Medical):       FAMILY HISTORY:  Family History  Problem Relation Age of Onset  . No Known Problems Mother   . High blood pressure (Hypertension) Father   . Hyperlipidemia (Elevated cholesterol) Father      GENERAL REVIEW OF SYSTEMS:   General ROS: negative for - chills, fatigue, fever, weight gain or weight loss Allergy and Immunology ROS: negative for - hives  Hematological and Lymphatic ROS: negative for - bleeding problems or bruising, negative for palpable nodes Endocrine ROS: negative for - heat or cold intolerance, hair changes Respiratory ROS: negative for - cough, shortness of breath or wheezing Cardiovascular ROS: no chest pain or palpitations GI ROS: negative for nausea, vomiting, abdominal pain, diarrhea, constipation Musculoskeletal ROS: negative for - joint swelling or muscle pain Neurological ROS: negative for - confusion, syncope Dermatological ROS: negative for pruritus and rash  PHYSICAL EXAM:   Vitals:   05/24/20 1607  BP: (!) 165/98  Pulse: 100  .  Ht:182.9 cm (6') Wt:(!) 125.6 kg (277 lb) XNT:ZGYF surface area is 2.53 meters squared. Body mass index is 37.57 kg/m.Marland Kitchen   GENERAL: Alert, active, oriented x3  HEENT: Pupils equal reactive to light. Extraocular movements are intact. Sclera clear. Palpebral conjunctiva normal red color.Pharynx clear.  NECK: Supple with no palpable mass and no adenopathy.  LUNGS: Sound clear with no rales rhonchi or wheezes.  HEART: Regular rhythm S1 and S2 without murmur.  ABDOMEN: Soft and depressible, nontender with no palpable mass, no hepatomegaly.  Left lower quadrant colostomy pink and patent.  EXTREMITIES: Well-developed well-nourished symmetrical with no dependent edema.  NEUROLOGICAL: Awake alert oriented, facial expression symmetrical, moving all extremities.      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  He has not had any complication with the colostomy.  He is tolerating diet.  He recently had colonoscopy and there is no other pathology identified.  Patient was oriented about the procedure of colostomy takedown.  Patient was oriented about the goal of the procedure that will be to connect the descending colon to the rectum.  Patient also oriented about the risk of surgery that includes infection, bleeding, anastomosis leak, intra-abdominal abscess, bowel obstruction, needs of reoperations, among others.  The patient understood and agreed to proceed.         PLAN:  1. Robotic assisted laparoscopic colostomy takedown ([id number]) 2. CBC, CMP 3. Bowel prep the day before surgery 4. Take prescribed antibiotics the day before surgery 5. Will call you with surgery date as soon as inpatient surgeries are allowed.  6. Contact us at any time if you have any concern.   Patient and his wife verbalized understanding, all questions were  answered, and were agreeable with the plan outlined above.   Herbert Pun, MD  Electronically signed by Herbert Pun, MD

## 2020-06-09 ENCOUNTER — Encounter
Admission: RE | Admit: 2020-06-09 | Discharge: 2020-06-09 | Disposition: A | Discharge status: Home / Self Care | Payer: No Typology Code available for payment source | Source: Ambulatory Visit | Attending: General Surgery | Admitting: General Surgery

## 2020-06-09 ENCOUNTER — Encounter: Payer: Self-pay | Admitting: Urgent Care

## 2020-06-09 ENCOUNTER — Other Ambulatory Visit: Payer: Self-pay

## 2020-06-09 DIAGNOSIS — Z933 Colostomy status: Secondary | ICD-10-CM | POA: Diagnosis not present

## 2020-06-09 DIAGNOSIS — Z01818 Encounter for other preprocedural examination: Secondary | ICD-10-CM | POA: Diagnosis present

## 2020-06-09 DIAGNOSIS — I10 Essential (primary) hypertension: Secondary | ICD-10-CM | POA: Diagnosis not present

## 2020-06-09 NOTE — Progress Notes (Signed)
  [facility] Perioperative Services: Pre-Admission/Anesthesia Testing     Date: 06/09/20  Name: CRUZE ZINGARO MRN:   [id number]  Re: Change in [place] for upcoming surgery   Case: [id number] Date/Time: 06/15/20 1015   Procedure: XI ROBOTIC ASSISTED COLOSTOMY TAKEDOWN (N/A Abdomen)   Anesthesia type: General   Pre-op diagnosis: Z93.3 Colostomy status   Location: [facility] OR ROOM 04 / [facility] ORS FOR ANESTHESIA GROUP   Surgeons: Herbert Pun, MD    Primary attending surgeon was consulted regarding consideration of therapeutic change in antimicrobial agent being used for preoperative prophylaxis in this patient's upcoming surgical case. Following analysis of the risk versus benefits, Dr. Herbert Pun, MD advising that it would be acceptable to discontinue the ordered ciprofloxacin + metronidazole and place an order for CEFOTETAN 2 gm IV on call to the OR. Orders for this patient were amended by me following collaborative conversation with attending surgeon.  Honor Loh, MSN, APRN, FNP-C, CEN [facility]  Peri-operative Services Nurse Practitioner Phone: [phone] 06/09/20 3:53 PM

## 2020-06-09 NOTE — Progress Notes (Signed)
[facility] Perioperative Services: Pre-Admission/Anesthesia Testing   Date: 06/09/20 Name: FRANKO HILLIKER MRN:   [id number]  Re: Consideration of preoperative prophylactic antibiotic change   Request sent to: Herbert Pun, MD (routed and/or faxed via [facility])  Planned Surgical Procedure(s):    Case: [id number] Date/Time: 06/15/20 1015   Procedure: XI ROBOTIC ASSISTED COLOSTOMY TAKEDOWN (N/A Abdomen)   Anesthesia type: General   Pre-op diagnosis: Z93.3 Colostomy status   Location: [facility] OR ROOM 04 / [facility] ORS FOR ANESTHESIA GROUP   Surgeons: Herbert Pun, MD     Notes: 1. Patient has a documented allergy to AMOXICILLIN  . Advising that PCN has caused him to experience low severity rash in the past.  2. Received cephalosporin with no documented complications . CEFTRIAXONE given on 11/22/2019 . CEFEPIME given on 11/22/2019 3. Screened as appropriate for cephalosporin use during medication reconciliation . No immediate angioedema, dysphagia, SOB, anaphylaxis symptoms. . No severe rash involving mucous membranes or skin necrosis. . No hospital admissions related to side effects of PCN/cephalosporin use.  . No documented reaction to PCN or cephalosporin in the last 10 years.  Request:  As an evidence based approach to reducing the rate of incidence for post-operative SSI and the development of MDROs, could an agent with narrower coverage for preoperative prophylaxis in this patient's upcoming surgical course be considered?  1. Currently ordered preoperative prophylactic ABX: ciprofloxacin + metronidazole.   2. Specifically requesting change to cephalosporin (CEFOTETAN).   3. Please communicate decision with me and I will change the orders in Epic as per your direction.   Things to consider:  Many patients report that they were "allergic" to PCN earlier in life, however this does not translate into a true lifelong allergy. Patients can lose  sensitivity to specific IgE antibodies over time if PCN is avoided (Kleris & Lugar, 2019).   Up to 10% of the adult population and 15% of hospitalized patients report an allergy to PCN, however clinical studies suggest that 90% of those reporting an allergy can tolerate PCN antibiotics (Kleris & Lugar, 2019).   Cross-sensitivity between PCN and cephalosporins has been documented as being as high as 10%, however this estimation included data believed to have been collected in a setting where there was contamination. Newer data suggests that the prevalence of cross-sensitivity between PCN and cephalosporins is actually estimated to be closer to 1% (Hermanides et al., 2018).    Patients labeled as PCN allergic, whether they are truly allergic or not, have been found to have inferior outcomes in terms of rates of serious infection, and these patients tend to have longer hospital stays (Phil Campbell, 2019).   Treatment related secondary infections, such as Clostridioides difficile, have been linked to the improper use of broad spectrum antibiotics in patients improperly labeled as PCN allergic (Kleris & Lugar, 2019).   Anaphylaxis from cephalosporins is rare and the evidence suggests that there is no increased risk of an anaphylactic type reaction when cephalosporins are used in a PCN allergic patient (Pichichero, 2006).  Citations: Hermanides J, Lemkes BA, Prins Pearla Dubonnet MW, Terreehorst I. Presumed ?-Lactam Allergy and Cross-reactivity in the Operating Theater: A Practical Approach. Anesthesiology. 2018 Aug;129(2):335-342. doi: 10.1097/ALN.0000000000002252. PMID: 29476546.  Kleris, [place]., & Lugar, P. L. (2019). Things We Do For No Reason: Failing to Question a Penicillin Allergy History. Journal of hospital medicine, 14(10), [phone]. Advance online publication. https://www.wallace-middleton.info/  Pichichero, M. E. (2006). Cephalosporins can be prescribed safely for penicillin-allergic patients.  Journal of  family medicine, 55(2), 106-112. Accessed: https://cdn.mdedge.com/files/s46fs-public/Document/September-2017/5502JFP_AppliedEvidence1.pdf   Honor Loh, MSN, APRN, FNP-C, CEN [facility]  Peri-operative Services Nurse Practitioner 06/09/20 1:52 PM

## 2020-06-09 NOTE — Patient Instructions (Signed)
Your procedure is scheduled on:06-15-20 [facility] Report to Day Surgery on the 2nd floor of the [facility]. To find out your arrival time, please call [phone] between 1PM - 3PM on:06-14-20 TUESDAY  REMEMBER: Instructions that are not followed completely may result in serious medical risk, up to and including death; or upon the discretion of your surgeon and anesthesiologist your surgery may need to be rescheduled.  Do not eat food after midnight the night before surgery.  No gum chewing, lozengers or hard candies.  You may however, drink CLEAR liquids up to 2 hours before you are scheduled to arrive for your surgery. Do not drink anything within 2 hours of your scheduled arrival time.  Clear liquids include: - water  - apple juice without pulp - gatorade (not RED, PURPLE, OR BLUE) - black coffee or tea (Do NOT add milk or creamers to the coffee or tea) Do NOT drink anything that is not on this list.  TAKE THESE MEDICATIONS THE MORNING OF SURGERY WITH A SIP OF WATER: -ALLOPURINOL (ZYLOPRIM) -COREG (CARVEDILOL)  -FOLLOW DR CINTRON-DIAZ'S BOWEL PREP INSTRUCTIONS GIVEN TO YOU IN THE OFFICE  One week prior to surgery: Stop Anti-inflammatories (NSAIDS) such as Advil, Aleve, Ibuprofen, Motrin, Naproxen, Naprosyn and Aspirin based products such as Excedrin, Goodys Powder, BC Powder-OK TO TAKE TYLENOL IF NEEDED  Stop ANY OVER THE COUNTER supplements until after surgery-HAS ALREADY STOPPED ELDERBERRY  No Alcohol for 24 hours before or after surgery.  No Smoking including e-cigarettes for 24 hours prior to surgery.  No chewable tobacco products for at least 6 hours prior to surgery.  No nicotine patches on the day of surgery.  Do not use any "recreational" drugs for at least a week prior to your surgery.  Please be advised that the combination of cocaine and anesthesia may have negative outcomes, up to and including death. If you test positive for cocaine, your surgery will be  cancelled.  On the morning of surgery brush your teeth with toothpaste and water, you may rinse your mouth with mouthwash if you wish. Do not swallow any toothpaste or mouthwash.  Do not wear jewelry, make-up, hairpins, clips or nail polish.  Do not wear lotions, powders, or perfumes.   Do not shave 48 hours prior to surgery.   Contact lenses, hearing aids and dentures may not be worn into surgery.  Do not bring valuables to the hospital. [facility] is not responsible for any missing/lost belongings or valuables.   Use CHG Soap as directed on instruction sheet.  Notify your doctor if there is any change in your medical condition (cold, fever, infection).  Wear comfortable clothing (specific to your surgery type) to the hospital.  Plan for stool softeners for home use; pain medications have a tendency to cause constipation. You can also help prevent constipation by eating foods high in fiber such as fruits and vegetables and drinking plenty of fluids as your diet allows.  After surgery, you can help prevent lung complications by doing breathing exercises.  Take deep breaths and cough every 1-2 hours. Your doctor may order a device called an Incentive Spirometer to help you take deep breaths. When coughing or sneezing, hold a pillow firmly against your incision with both hands. This is called "splinting." Doing this helps protect your incision. It also decreases belly discomfort.  If you are being admitted to the hospital overnight, leave your suitcase in the car. After surgery it may be brought to your room.  If you are  being discharged the day of surgery, you will not be allowed to drive home. You will need a responsible adult (18 years or older) to drive you home and stay with you that night.   If you are taking public transportation, you will need to have a responsible adult (18 years or older) with you. Please confirm with your physician that it is acceptable to use public  transportation.   Please call the [facility] at [phone] if you have any questions about these instructions.  Visitation Policy:  Patients undergoing a surgery or procedure may have one family member or support person with them as long as that person is not COVID-19 positive or experiencing its symptoms.  That person may remain in the waiting area during the procedure.  Inpatient Visitation Update:   In an effort to ensure the safety of our team members and our patients, we are implementing a change to our visitation policy:  Effective Monday, Aug. 9, at 7 a.m., inpatients will be allowed one support person.  o The support person may change daily.  o The support person must pass our screening, gel in and out, and wear a mask at all times, including in the patient's room.  o Patients must also wear a mask when staff or their support person are in the room.  o Masking is required regardless of vaccination status.  Systemwide, no visitors 17 or younger.

## 2020-06-13 ENCOUNTER — Other Ambulatory Visit: Payer: No Typology Code available for payment source

## 2020-06-13 ENCOUNTER — Encounter
Admission: RE | Admit: 2020-06-13 | Discharge: 2020-06-13 | Disposition: A | Discharge status: Home / Self Care | Payer: No Typology Code available for payment source | Source: Ambulatory Visit | Attending: General Surgery | Admitting: General Surgery

## 2020-06-13 ENCOUNTER — Other Ambulatory Visit: Payer: Self-pay

## 2020-06-13 DIAGNOSIS — Z20822 Contact with and (suspected) exposure to covid-19: Secondary | ICD-10-CM | POA: Diagnosis not present

## 2020-06-13 DIAGNOSIS — I10 Essential (primary) hypertension: Secondary | ICD-10-CM | POA: Diagnosis not present

## 2020-06-13 DIAGNOSIS — Z01818 Encounter for other preprocedural examination: Secondary | ICD-10-CM | POA: Diagnosis present

## 2020-06-13 LAB — SARS CORONAVIRUS 2 (TAT 6-24 HRS): SARS Coronavirus 2: NEGATIVE

## 2020-06-15 ENCOUNTER — Encounter: Payer: Self-pay | Admitting: General Surgery

## 2020-06-15 ENCOUNTER — Encounter
Admission: RE | Disposition: A | Discharge status: Home / Self Care | Payer: Self-pay | Source: Home / Self Care | Attending: General Surgery

## 2020-06-15 ENCOUNTER — Ambulatory Visit
Admission: RE | Admit: 2020-06-15 | Discharge: 2020-06-15 | Disposition: A | Discharge status: Home / Self Care | Payer: No Typology Code available for payment source | Attending: General Surgery | Admitting: General Surgery

## 2020-06-15 DIAGNOSIS — R21 Rash and other nonspecific skin eruption: Secondary | ICD-10-CM | POA: Diagnosis not present

## 2020-06-15 DIAGNOSIS — Z538 Procedure and treatment not carried out for other reasons: Secondary | ICD-10-CM | POA: Diagnosis not present

## 2020-06-15 DIAGNOSIS — I959 Hypotension, unspecified: Secondary | ICD-10-CM | POA: Insufficient documentation

## 2020-06-15 DIAGNOSIS — S8012XA Contusion of left lower leg, initial encounter: Secondary | ICD-10-CM | POA: Diagnosis not present

## 2020-06-15 DIAGNOSIS — S8011XA Contusion of right lower leg, initial encounter: Secondary | ICD-10-CM | POA: Diagnosis not present

## 2020-06-15 LAB — GLUCOSE, CAPILLARY: Glucose-Capillary: 175 mg/dL — ABNORMAL HIGH (ref 70–99)

## 2020-06-15 SURGERY — CLOSURE, COLOSTOMY, ROBOT-ASSISTED
Anesthesia: General | Site: Abdomen

## 2020-06-15 MED ORDER — ACETAMINOPHEN 500 MG PO TABS: 1000.0000 mg | ORAL_TABLET | ORAL | Status: DC

## 2020-06-15 MED ORDER — BUPIVACAINE LIPOSOME 1.3 % IJ SUSP: 20.0000 mL | Freq: Once | INTRAMUSCULAR | Status: DC

## 2020-06-15 MED ORDER — GABAPENTIN 300 MG PO CAPS: 300.0000 mg | ORAL_CAPSULE | ORAL | Status: DC

## 2020-06-15 MED ORDER — ALVIMOPAN 12 MG PO CAPS: 12.0000 mg | ORAL_CAPSULE | ORAL | Status: DC

## 2020-06-15 MED ORDER — CELECOXIB 200 MG PO CAPS
ORAL_CAPSULE | ORAL | Status: AC
  Filled 2020-06-15: qty 1

## 2020-06-15 MED ORDER — LACTATED RINGERS IV SOLN: INTRAVENOUS | Status: DC

## 2020-06-15 MED ORDER — ACETAMINOPHEN 500 MG PO TABS
ORAL_TABLET | ORAL | Status: AC
  Filled 2020-06-15: qty 2

## 2020-06-15 MED ORDER — CHLORHEXIDINE GLUCONATE 0.12 % MT SOLN
OROMUCOSAL | Status: AC
  Filled 2020-06-15: qty 15

## 2020-06-15 MED ORDER — ALVIMOPAN 12 MG PO CAPS
ORAL_CAPSULE | ORAL | Status: AC
  Filled 2020-06-15: qty 1

## 2020-06-15 MED ORDER — CELECOXIB 200 MG PO CAPS: 200.0000 mg | ORAL_CAPSULE | ORAL | Status: DC

## 2020-06-15 MED ORDER — GABAPENTIN 300 MG PO CAPS
ORAL_CAPSULE | ORAL | Status: AC
  Filled 2020-06-15: qty 1

## 2020-06-15 MED ORDER — CEFAZOLIN SODIUM-DEXTROSE 2-4 GM/100ML-% IV SOLN
INTRAVENOUS | Status: AC
  Filled 2020-06-15: qty 100

## 2020-06-15 MED ORDER — FAMOTIDINE 20 MG PO TABS: 20.0000 mg | ORAL_TABLET | Freq: Once | ORAL | Status: DC

## 2020-06-15 MED ORDER — SODIUM CHLORIDE 0.9 % IV SOLN: 2.0000 g | Freq: Two times a day (BID) | INTRAVENOUS | Status: DC

## 2020-06-15 MED ORDER — ORAL CARE MOUTH RINSE: 15.0000 mL | Freq: Once | OROMUCOSAL | Status: DC

## 2020-06-15 MED ORDER — CHLORHEXIDINE GLUCONATE 0.12 % MT SOLN: 15.0000 mL | Freq: Once | OROMUCOSAL | Status: DC

## 2020-06-15 MED ORDER — FAMOTIDINE 20 MG PO TABS
ORAL_TABLET | ORAL | Status: AC
  Filled 2020-06-15: qty 1

## 2020-06-15 SURGICAL SUPPLY — 89 items
BLADE CLIPPER SURG (BLADE) IMPLANT
BLADE SURG SZ10 CARB STEEL (BLADE) ×2 IMPLANT
BLADE SURG SZ11 CARB STEEL (BLADE) ×2 IMPLANT
CANISTER SUCT 1200ML W/VALVE (MISCELLANEOUS) ×2 IMPLANT
CANNULA REDUC XI 12-8 STAPL (CANNULA) ×1
CANNULA REDUCER 12-8 DVNC XI (CANNULA) ×1 IMPLANT
CHLORAPREP W/TINT 26 (MISCELLANEOUS) ×2 IMPLANT
CLIP VESOLOCK MED LG 6/CT (CLIP) IMPLANT
COVER TIP SHEARS 8 DVNC (MISCELLANEOUS) ×1 IMPLANT
COVER TIP SHEARS 8MM DA VINCI (MISCELLANEOUS) ×1
COVER WAND RF STERILE (DRAPES) ×2 IMPLANT
DEFOGGER SCOPE WARMER CLEARIFY (MISCELLANEOUS) ×2 IMPLANT
DERMABOND ADVANCED (GAUZE/BANDAGES/DRESSINGS) ×1
DERMABOND ADVANCED .7 DNX12 (GAUZE/BANDAGES/DRESSINGS) ×1 IMPLANT
DRAPE ARM DVNC X/XI (DISPOSABLE) ×4 IMPLANT
DRAPE COLUMN DVNC XI (DISPOSABLE) ×1 IMPLANT
DRAPE DA VINCI XI ARM (DISPOSABLE) ×4
DRAPE DA VINCI XI COLUMN (DISPOSABLE) ×1
DRAPE UNDER BUTTOCK W/FLU (DRAPES) ×2 IMPLANT
DRSG OPSITE POSTOP 4X10 (GAUZE/BANDAGES/DRESSINGS) IMPLANT
DRSG OPSITE POSTOP 4X8 (GAUZE/BANDAGES/DRESSINGS) IMPLANT
ELECT BLADE 6.5 EXT (BLADE) IMPLANT
ELECT CAUTERY BLADE 6.4 (BLADE) ×2 IMPLANT
ELECT REM PT RETURN 9FT ADLT (ELECTROSURGICAL) ×2
ELECTRODE REM PT RTRN 9FT ADLT (ELECTROSURGICAL) ×1 IMPLANT
GLOVE BIO SURGEON STRL SZ 6.5 (GLOVE) ×6 IMPLANT
GLOVE BIOGEL PI IND STRL 6.5 (GLOVE) ×3 IMPLANT
GLOVE BIOGEL PI INDICATOR 6.5 (GLOVE) ×3
GOWN STRL REUS W/ TWL LRG LVL3 (GOWN DISPOSABLE) ×6 IMPLANT
GOWN STRL REUS W/TWL LRG LVL3 (GOWN DISPOSABLE) ×6
GRASPER SUT TROCAR 14GX15 (MISCELLANEOUS) ×2 IMPLANT
HANDLE YANKAUER SUCT BULB TIP (MISCELLANEOUS) ×2 IMPLANT
IRRIGATION STRYKERFLOW (MISCELLANEOUS) IMPLANT
IRRIGATOR STRYKERFLOW (MISCELLANEOUS)
IRRIGATOR SUCT 8 DISP DVNC XI (IRRIGATION / IRRIGATOR) IMPLANT
IRRIGATOR SUCTION 8MM XI DISP (IRRIGATION / IRRIGATOR)
IV NS 1000ML (IV SOLUTION)
IV NS 1000ML BAXH (IV SOLUTION) IMPLANT
KIT PINK PAD W/HEAD ARE REST (MISCELLANEOUS) ×2
KIT PINK PAD W/HEAD ARM REST (MISCELLANEOUS) ×1 IMPLANT
LABEL OR SOLS (LABEL) ×2 IMPLANT
LEGGING LITHOTOMY PAIR STRL (DRAPES) ×2 IMPLANT
NEEDLE HYPO 22GX1.5 SAFETY (NEEDLE) ×2 IMPLANT
OBTURATOR OPTICAL STANDARD 8MM (TROCAR) ×1
OBTURATOR OPTICAL STND 8 DVNC (TROCAR) ×1
OBTURATOR OPTICALSTD 8 DVNC (TROCAR) ×1 IMPLANT
PACK COLON CLEAN CLOSURE (MISCELLANEOUS) ×2 IMPLANT
PACK LAP CHOLECYSTECTOMY (MISCELLANEOUS) ×2 IMPLANT
PENCIL ELECTRO HAND CTR (MISCELLANEOUS) ×2 IMPLANT
PORT ACCESS TROCAR AIRSEAL 5 (TROCAR) ×2 IMPLANT
RELOAD STAPLER 3.5X45 BLU DVNC (STAPLE) IMPLANT
RELOAD STAPLER 3.5X60 BLU DVNC (STAPLE) IMPLANT
RETRACTOR RING XSMALL (MISCELLANEOUS) IMPLANT
RTRCTR WOUND ALEXIS 13CM XS SH (MISCELLANEOUS)
SEAL CANN UNIV 5-8 DVNC XI (MISCELLANEOUS) ×3 IMPLANT
SEAL XI 5MM-8MM UNIVERSAL (MISCELLANEOUS) ×3
SEALER VESSEL DA VINCI XI (MISCELLANEOUS)
SEALER VESSEL EXT DVNC XI (MISCELLANEOUS) IMPLANT
SET TRI-LUMEN FLTR TB AIRSEAL (TUBING) ×2 IMPLANT
SOL PREP PVP 2OZ (MISCELLANEOUS) ×2
SOLUTION ELECTROLUBE (MISCELLANEOUS) ×2 IMPLANT
SOLUTION PREP PVP 2OZ (MISCELLANEOUS) ×1 IMPLANT
SPONGE LAP 4X18 RFD (DISPOSABLE) ×2 IMPLANT
STAPLER 45 DA VINCI SURE FORM (STAPLE)
STAPLER 45 SUREFORM DVNC (STAPLE) IMPLANT
STAPLER 60 DA VINCI SURE FORM (STAPLE)
STAPLER 60 SUREFORM DVNC (STAPLE) IMPLANT
STAPLER CANNULA SEAL DVNC XI (STAPLE) ×1 IMPLANT
STAPLER CANNULA SEAL XI (STAPLE) ×1
STAPLER RELOAD 3.5X45 BLU DVNC (STAPLE)
STAPLER RELOAD 3.5X45 BLUE (STAPLE)
STAPLER RELOAD 3.5X60 BLU DVNC (STAPLE)
STAPLER RELOAD 3.5X60 BLUE (STAPLE)
SURGILUBE 2OZ TUBE FLIPTOP (MISCELLANEOUS) ×2 IMPLANT
SUT MNCRL 4-0 (SUTURE) ×2
SUT MNCRL 4-0 27XMFL (SUTURE) ×2
SUT PDS PLUS 0 (SUTURE) ×2
SUT PDS PLUS AB 0 CT-2 (SUTURE) ×2 IMPLANT
SUT SILK 0 SH 30 (SUTURE) ×4 IMPLANT
SUT SILK 3-0 (SUTURE) IMPLANT
SUT VIC AB 3-0 SH 27 (SUTURE) ×4
SUT VIC AB 3-0 SH 27X BRD (SUTURE) ×4 IMPLANT
SUT VICRYL 0 AB UR-6 (SUTURE) ×4 IMPLANT
SUT VLOC 90 6 CV-15 VIOLET (SUTURE) IMPLANT
SUTURE MNCRL 4-0 27XMF (SUTURE) ×2 IMPLANT
SYR 30ML LL (SYRINGE) ×2 IMPLANT
SYS TROCAR 1.5-3 SLV ABD GEL (ENDOMECHANICALS)
SYSTEM TROCR 1.5-3 SLV ABD GEL (ENDOMECHANICALS) IMPLANT
TRAY FOLEY MTR SLVR 16FR STAT (SET/KITS/TRAYS/PACK) ×2 IMPLANT
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
## 2020-06-15 NOTE — H&P (View-Only) (Signed)
Patient case cancelled by anesthesia due to low blood pressure upon arrival to Pre procedure area due to new blood pressure medication. Patient will need to be re evaluated by PCP for clearance.

## 2020-06-15 NOTE — H&P (Signed)
Patient case cancelled by anesthesia due to low blood pressure upon arrival to Pre procedure area due to new blood pressure medication. Patient will need to be re evaluated by PCP for clearance.

## 2020-06-20 ENCOUNTER — Other Ambulatory Visit: Payer: Self-pay | Admitting: General Surgery

## 2020-06-20 DIAGNOSIS — M79662 Pain in left lower leg: Secondary | ICD-10-CM

## 2020-06-20 DIAGNOSIS — M79661 Pain in right lower leg: Secondary | ICD-10-CM

## 2020-06-20 DIAGNOSIS — M7989 Other specified soft tissue disorders: Secondary | ICD-10-CM

## 2020-06-22 ENCOUNTER — Ambulatory Visit
Admission: RE | Admit: 2020-06-22 | Discharge: 2020-06-22 | Disposition: A | Discharge status: Home / Self Care | Payer: No Typology Code available for payment source | Source: Ambulatory Visit | Attending: General Surgery | Admitting: General Surgery

## 2020-06-22 ENCOUNTER — Other Ambulatory Visit: Payer: Self-pay

## 2020-06-22 DIAGNOSIS — M7989 Other specified soft tissue disorders: Secondary | ICD-10-CM | POA: Insufficient documentation

## 2020-06-22 DIAGNOSIS — M79662 Pain in left lower leg: Secondary | ICD-10-CM | POA: Diagnosis not present

## 2020-06-22 DIAGNOSIS — M79661 Pain in right lower leg: Secondary | ICD-10-CM | POA: Diagnosis present

## 2020-06-22 IMAGING — US US EXTREM LOW VENOUS
1 series · 14 of 24 positions shown · non-contrast
Comparison: None.

CLINICAL DATA: Right leg pain

EXAM:
BILATERAL LOWER EXTREMITY VENOUS DOPPLER ULTRASOUND
TECHNIQUE: Gray-scale sonography with compression, as well as color and duplex
ultrasound, were performed to evaluate the deep venous system(s)
from the level of the common femoral vein through the popliteal and
proximal calf veins.

[Series 1: us venous img lower bilat (dvt) · portal-venous · 14 of 59 slices shown]
[im 1/59]
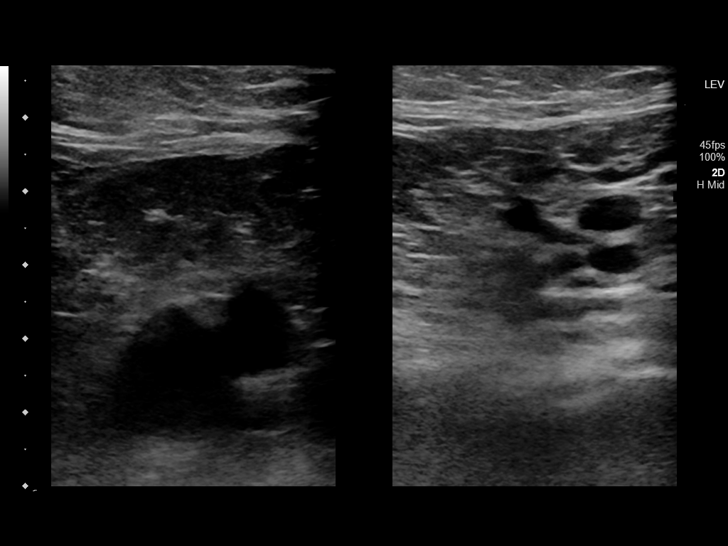
[im 6/59]
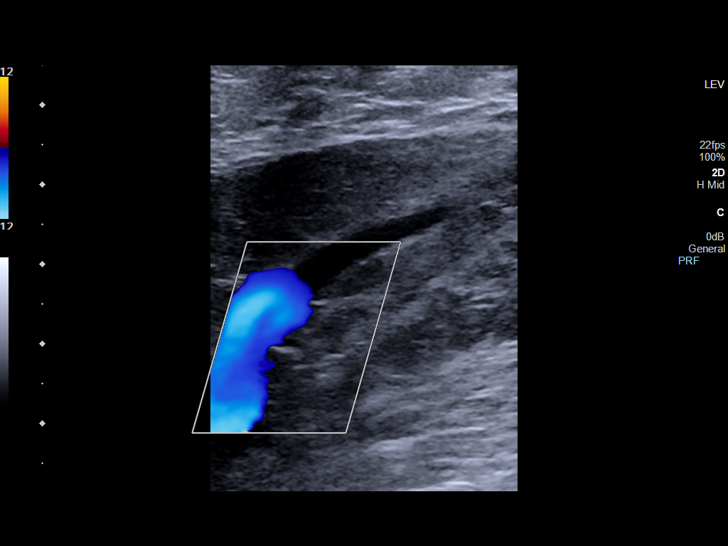
[im 11/59]
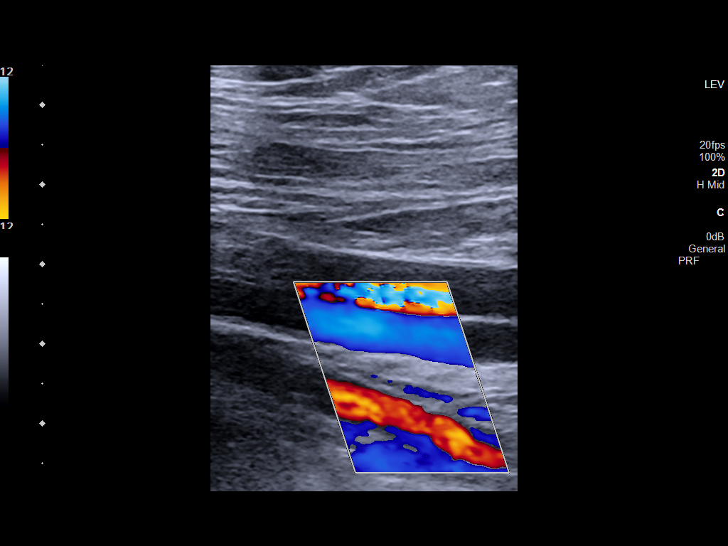
[im 16/59]
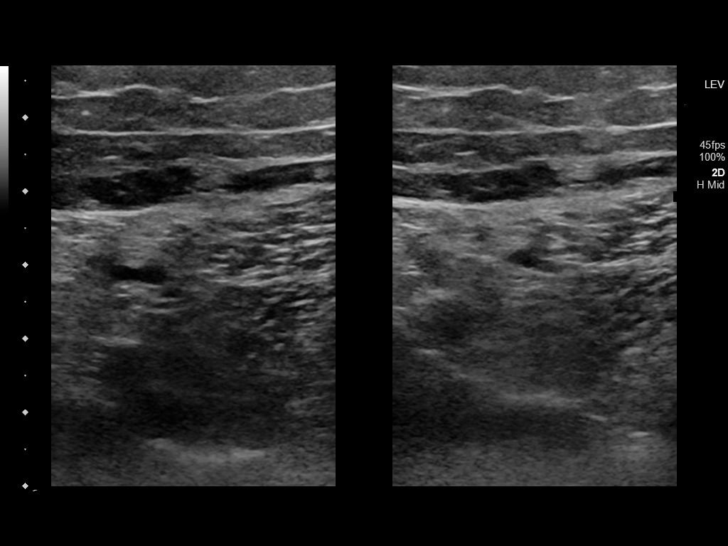
[im 18/59]
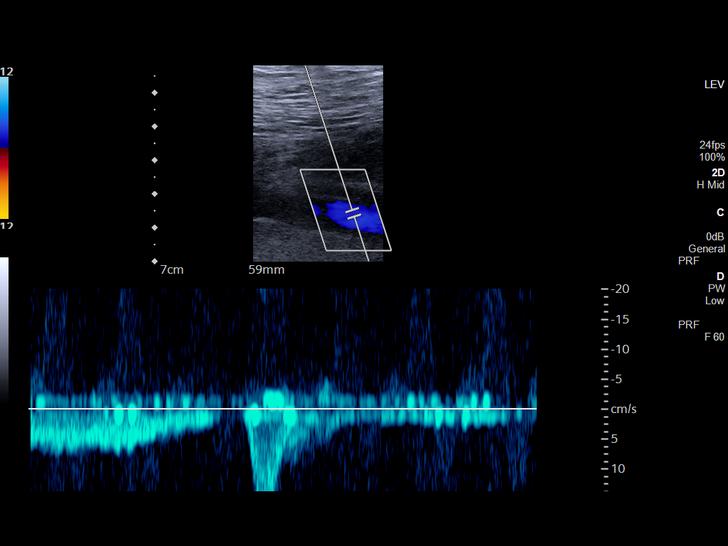
[im 23/59]
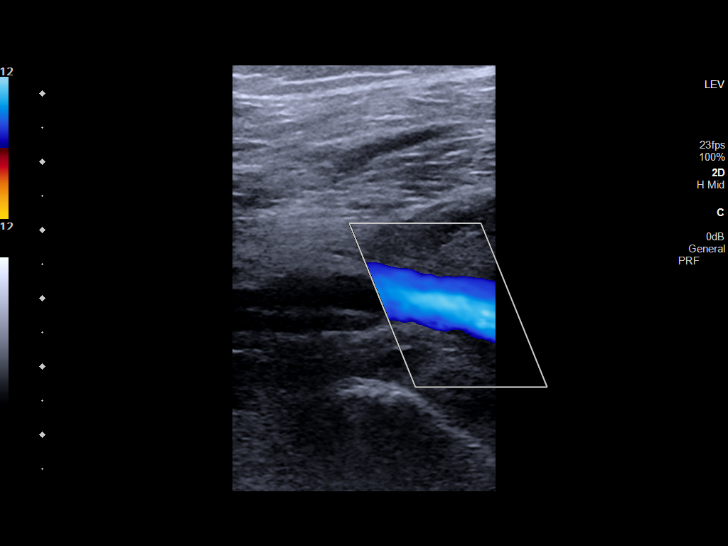
[im 28/59]
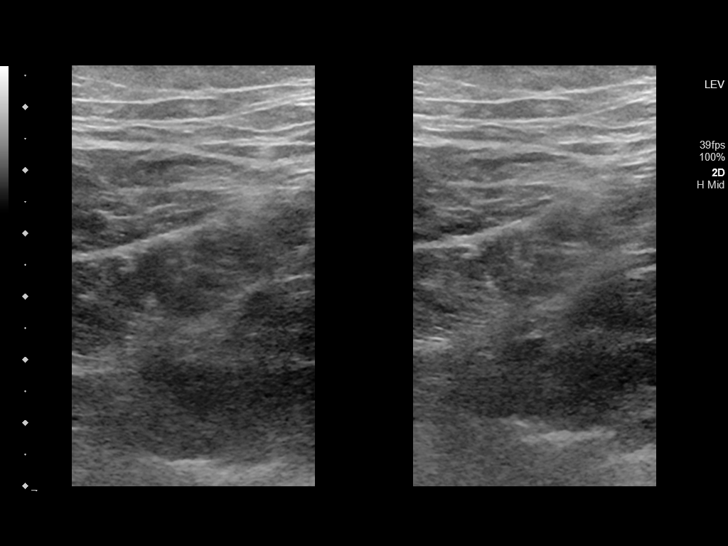
[im 31/59]
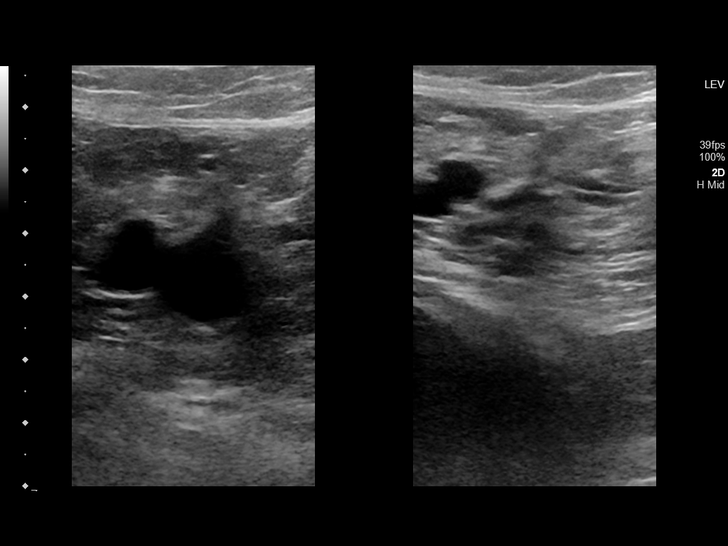
[im 36/59]
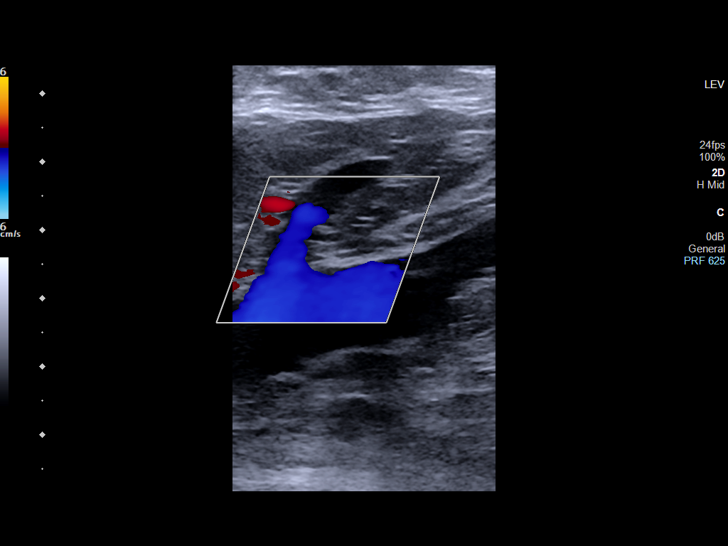
[im 41/59]
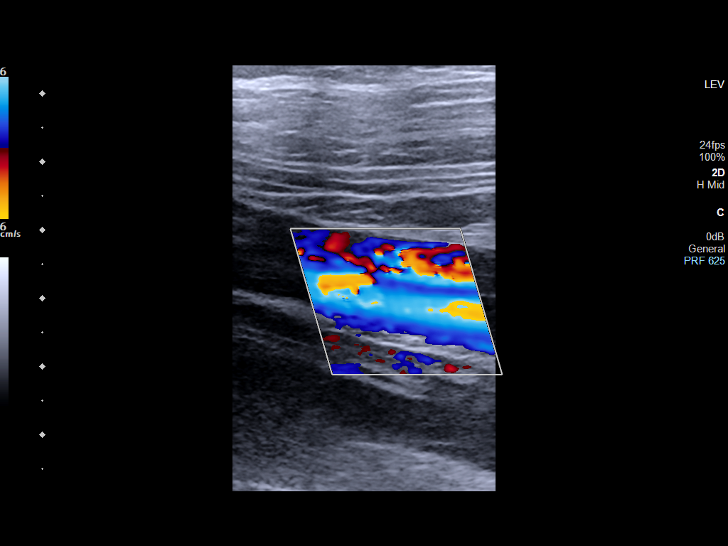
[im 46/59]
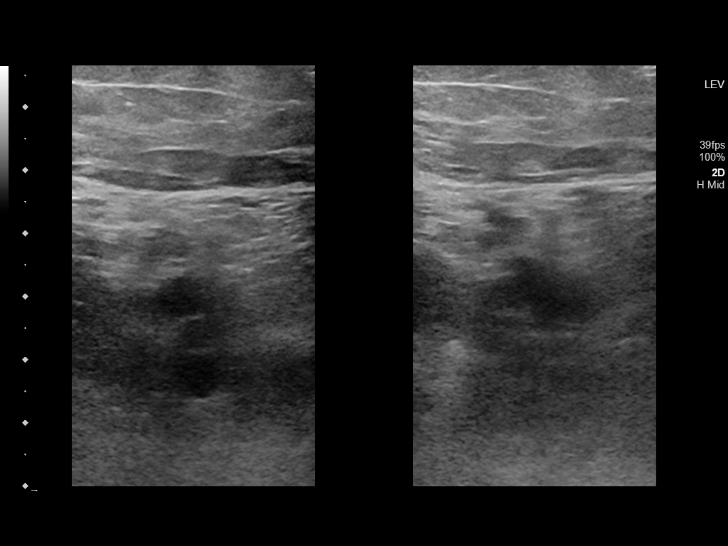
[im 48/59]
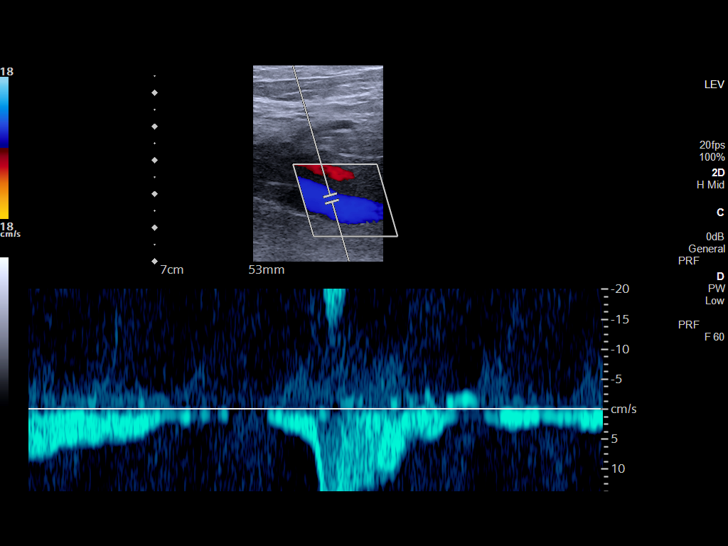
[im 53/59]
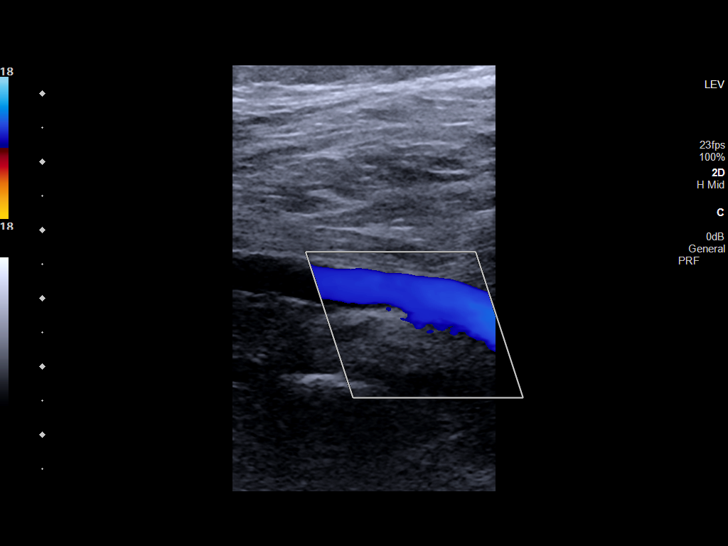
[im 59/59]
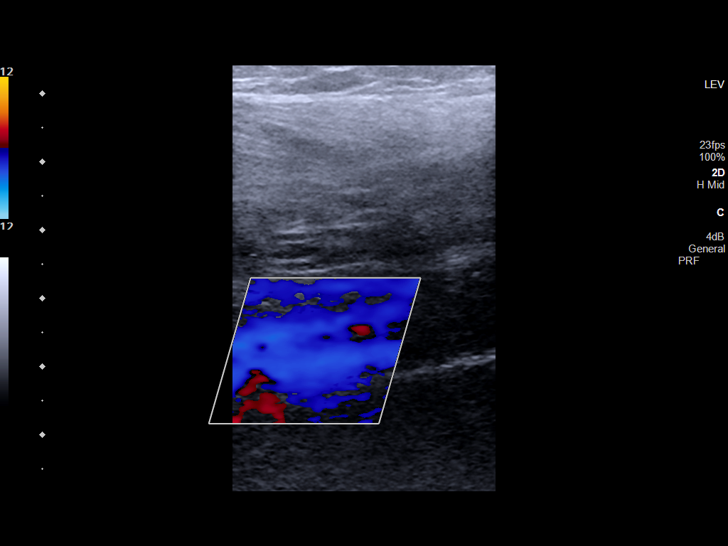

[14 of 24 positions shown; findings below may reference images not displayed]

FINDINGS: VENOUS

Normal compressibility of the common femoral, superficial femoral,
and popliteal veins, as well as the visualized calf veins.
Visualized portions of profunda femoral vein and great saphenous
vein unremarkable. No filling defects to suggest DVT on grayscale or
color Doppler imaging. Doppler waveforms show normal direction of
venous flow, normal respiratory plasticity and response to
augmentation.

OTHER

None.

Limitations: none
IMPRESSION: 1. No evidence of deep venous thrombosis within either lower
extremity.

## 2020-06-24 ENCOUNTER — Other Ambulatory Visit
Admission: RE | Admit: 2020-06-24 | Discharge: 2020-06-24 | Disposition: A | Discharge status: Home / Self Care | Payer: No Typology Code available for payment source | Source: Ambulatory Visit | Attending: General Surgery | Admitting: General Surgery

## 2020-06-24 ENCOUNTER — Other Ambulatory Visit: Payer: Self-pay

## 2020-06-24 DIAGNOSIS — Z01812 Encounter for preprocedural laboratory examination: Secondary | ICD-10-CM | POA: Diagnosis not present

## 2020-06-24 DIAGNOSIS — Z20822 Contact with and (suspected) exposure to covid-19: Secondary | ICD-10-CM | POA: Diagnosis not present

## 2020-06-25 LAB — SARS CORONAVIRUS 2 (TAT 6-24 HRS): SARS Coronavirus 2: NEGATIVE

## 2020-06-28 ENCOUNTER — Inpatient Hospital Stay: Payer: No Typology Code available for payment source | Admitting: Anesthesiology

## 2020-06-28 ENCOUNTER — Other Ambulatory Visit: Payer: Self-pay

## 2020-06-28 ENCOUNTER — Inpatient Hospital Stay
Admission: RE | Admit: 2020-06-28 | Discharge: 2020-07-03 | DRG: 331 | Disposition: A | Discharge status: Home / Self Care | Payer: No Typology Code available for payment source | Attending: General Surgery | Admitting: General Surgery

## 2020-06-28 ENCOUNTER — Encounter: Payer: Self-pay | Admitting: General Surgery

## 2020-06-28 ENCOUNTER — Encounter
Admission: RE | Disposition: A | Discharge status: Home / Self Care | Payer: Self-pay | Source: Home / Self Care | Attending: General Surgery

## 2020-06-28 DIAGNOSIS — Z87891 Personal history of nicotine dependence: Secondary | ICD-10-CM

## 2020-06-28 DIAGNOSIS — K660 Peritoneal adhesions (postprocedural) (postinfection): Secondary | ICD-10-CM | POA: Diagnosis present

## 2020-06-28 DIAGNOSIS — I959 Hypotension, unspecified: Secondary | ICD-10-CM | POA: Diagnosis present

## 2020-06-28 DIAGNOSIS — Z433 Encounter for attention to colostomy: Secondary | ICD-10-CM | POA: Diagnosis present

## 2020-06-28 DIAGNOSIS — K435 Parastomal hernia without obstruction or  gangrene: Secondary | ICD-10-CM | POA: Diagnosis present

## 2020-06-28 DIAGNOSIS — Z5331 Laparoscopic surgical procedure converted to open procedure: Secondary | ICD-10-CM | POA: Diagnosis not present

## 2020-06-28 DIAGNOSIS — Z20822 Contact with and (suspected) exposure to covid-19: Secondary | ICD-10-CM | POA: Diagnosis present

## 2020-06-28 DIAGNOSIS — M109 Gout, unspecified: Secondary | ICD-10-CM | POA: Diagnosis present

## 2020-06-28 DIAGNOSIS — Z933 Colostomy status: Secondary | ICD-10-CM

## 2020-06-28 HISTORY — PX: XI ROBOTIC ASSISTED COLOSTOMY TAKEDOWN: SHX6828

## 2020-06-28 SURGERY — CLOSURE, COLOSTOMY, ROBOT-ASSISTED
Anesthesia: General | Site: Abdomen

## 2020-06-28 MED ORDER — GABAPENTIN 300 MG PO CAPS: 300.0000 mg | ORAL_CAPSULE | Freq: Once | ORAL | Status: AC

## 2020-06-28 MED ORDER — SUGAMMADEX SODIUM 500 MG/5ML IV SOLN
INTRAVENOUS | Status: DC | PRN
  Administered 2020-06-28: 400 mg via INTRAVENOUS

## 2020-06-28 MED ORDER — PROMETHAZINE HCL 25 MG/ML IJ SOLN: 6.2500 mg | INTRAMUSCULAR | Status: DC | PRN

## 2020-06-28 MED ORDER — CELECOXIB 200 MG PO CAPS
ORAL_CAPSULE | ORAL | Status: AC
  Administered 2020-06-28: 200 mg via ORAL
  Filled 2020-06-28: qty 1

## 2020-06-28 MED ORDER — GABAPENTIN 300 MG PO CAPS
ORAL_CAPSULE | ORAL | Status: AC
  Administered 2020-06-28: 300 mg via ORAL
  Filled 2020-06-28: qty 1

## 2020-06-28 MED ORDER — PHENYLEPHRINE HCL (PRESSORS) 10 MG/ML IV SOLN
INTRAVENOUS | Status: DC | PRN
  Administered 2020-06-28: 100 ug via INTRAVENOUS
  Administered 2020-06-28: 200 ug via INTRAVENOUS
  Administered 2020-06-28: 100 ug via INTRAVENOUS
  Administered 2020-06-28 (×2): 200 ug via INTRAVENOUS
  Administered 2020-06-28 (×4): 100 ug via INTRAVENOUS
  Administered 2020-06-28: 200 ug via INTRAVENOUS
  Administered 2020-06-28 (×2): 100 ug via INTRAVENOUS
  Administered 2020-06-28 (×2): 200 ug via INTRAVENOUS
  Administered 2020-06-28: 100 ug via INTRAVENOUS

## 2020-06-28 MED ORDER — ALLOPURINOL 100 MG PO TABS
300.0000 mg | ORAL_TABLET | ORAL | Status: DC
  Administered 2020-06-29 – 2020-07-03 (×5): 300 mg via ORAL
  Filled 2020-06-28 (×5): qty 3

## 2020-06-28 MED ORDER — LIDOCAINE HCL (CARDIAC) PF 100 MG/5ML IV SOSY
PREFILLED_SYRINGE | INTRAVENOUS | Status: DC | PRN
  Administered 2020-06-28: 100 mg via INTRAVENOUS

## 2020-06-28 MED ORDER — VISTASEAL 10 ML SINGLE DOSE KIT
PACK | CUTANEOUS | Status: DC | PRN
  Administered 2020-06-28: 10 mL via TOPICAL

## 2020-06-28 MED ORDER — CELECOXIB 200 MG PO CAPS: 200.0000 mg | ORAL_CAPSULE | Freq: Once | ORAL | Status: AC

## 2020-06-28 MED ORDER — CLOTRIMAZOLE 1 % EX CREA
TOPICAL_CREAM | Freq: Every day | CUTANEOUS | Status: DC
  Filled 2020-06-28: qty 15

## 2020-06-28 MED ORDER — ROCURONIUM BROMIDE 10 MG/ML (PF) SYRINGE
PREFILLED_SYRINGE | INTRAVENOUS | Status: AC
  Filled 2020-06-28: qty 10

## 2020-06-28 MED ORDER — ALVIMOPAN 12 MG PO CAPS: 12.0000 mg | ORAL_CAPSULE | ORAL | Status: AC

## 2020-06-28 MED ORDER — ALVIMOPAN 12 MG PO CAPS
12.0000 mg | ORAL_CAPSULE | Freq: Two times a day (BID) | ORAL | Status: DC
  Administered 2020-06-29 – 2020-06-30 (×4): 12 mg via ORAL
  Filled 2020-06-28 (×5): qty 1

## 2020-06-28 MED ORDER — ONDANSETRON HCL 4 MG/2ML IJ SOLN
4.0000 mg | Freq: Four times a day (QID) | INTRAMUSCULAR | Status: DC | PRN
  Administered 2020-07-01 – 2020-07-02 (×2): 4 mg via INTRAVENOUS
  Filled 2020-06-28 (×2): qty 2

## 2020-06-28 MED ORDER — HYDROCODONE-ACETAMINOPHEN 5-325 MG PO TABS
1.0000 | ORAL_TABLET | ORAL | Status: DC | PRN
  Administered 2020-06-28 – 2020-06-29 (×2): 1 via ORAL
  Administered 2020-06-29 (×2): 2 via ORAL
  Administered 2020-06-29: 1 via ORAL
  Administered 2020-06-30 – 2020-07-02 (×6): 2 via ORAL
  Filled 2020-06-28: qty 2
  Filled 2020-06-28: qty 1
  Filled 2020-06-28 (×3): qty 2
  Filled 2020-06-28: qty 1
  Filled 2020-06-28: qty 2
  Filled 2020-06-28: qty 1
  Filled 2020-06-28 (×4): qty 2

## 2020-06-28 MED ORDER — SUCCINYLCHOLINE CHLORIDE 20 MG/ML IJ SOLN
INTRAMUSCULAR | Status: DC | PRN
  Administered 2020-06-28: 140 mg via INTRAVENOUS

## 2020-06-28 MED ORDER — ROCURONIUM BROMIDE 100 MG/10ML IV SOLN
INTRAVENOUS | Status: DC | PRN
  Administered 2020-06-28: 30 mg via INTRAVENOUS
  Administered 2020-06-28 (×2): 20 mg via INTRAVENOUS
  Administered 2020-06-28: 50 mg via INTRAVENOUS
  Administered 2020-06-28: 10 mg via INTRAVENOUS
  Administered 2020-06-28: 20 mg via INTRAVENOUS

## 2020-06-28 MED ORDER — PROPOFOL 10 MG/ML IV BOLUS
INTRAVENOUS | Status: AC
  Filled 2020-06-28: qty 20

## 2020-06-28 MED ORDER — MIDAZOLAM HCL 2 MG/2ML IJ SOLN
INTRAMUSCULAR | Status: AC
  Filled 2020-06-28: qty 2

## 2020-06-28 MED ORDER — BUPIVACAINE LIPOSOME 1.3 % IJ SUSP
INTRAMUSCULAR | Status: AC
  Filled 2020-06-28: qty 20

## 2020-06-28 MED ORDER — KETOROLAC TROMETHAMINE 30 MG/ML IJ SOLN
30.0000 mg | Freq: Three times a day (TID) | INTRAMUSCULAR | Status: DC
  Administered 2020-06-28 – 2020-07-03 (×14): 30 mg via INTRAVENOUS
  Filled 2020-06-28 (×14): qty 1

## 2020-06-28 MED ORDER — SODIUM CHLORIDE 0.9 % IV SOLN: INTRAVENOUS | Status: DC

## 2020-06-28 MED ORDER — VISTASEAL 10 ML SINGLE DOSE KIT
PACK | CUTANEOUS | Status: AC
  Filled 2020-06-28: qty 10

## 2020-06-28 MED ORDER — DEXAMETHASONE SODIUM PHOSPHATE 10 MG/ML IJ SOLN
INTRAMUSCULAR | Status: DC | PRN
  Administered 2020-06-28: 10 mg via INTRAVENOUS

## 2020-06-28 MED ORDER — DEXMEDETOMIDINE (PRECEDEX) IN NS 20 MCG/5ML (4 MCG/ML) IV SYRINGE
PREFILLED_SYRINGE | INTRAVENOUS | Status: DC | PRN
  Administered 2020-06-28 (×2): 8 ug via INTRAVENOUS

## 2020-06-28 MED ORDER — FENTANYL CITRATE (PF) 100 MCG/2ML IJ SOLN
INTRAMUSCULAR | Status: DC | PRN
  Administered 2020-06-28: 50 ug via INTRAVENOUS
  Administered 2020-06-28: 100 ug via INTRAVENOUS
  Administered 2020-06-28 (×2): 50 ug via INTRAVENOUS

## 2020-06-28 MED ORDER — CHLORHEXIDINE GLUCONATE 0.12 % MT SOLN
OROMUCOSAL | Status: AC
  Administered 2020-06-28: 15 mL via OROMUCOSAL
  Filled 2020-06-28: qty 15

## 2020-06-28 MED ORDER — VASOPRESSIN 20 UNIT/ML IV SOLN
INTRAVENOUS | Status: AC
  Filled 2020-06-28: qty 1

## 2020-06-28 MED ORDER — BUPIVACAINE-EPINEPHRINE (PF) 0.25% -1:200000 IJ SOLN
INTRAMUSCULAR | Status: AC
  Filled 2020-06-28: qty 30

## 2020-06-28 MED ORDER — BUPIVACAINE-EPINEPHRINE (PF) 0.25% -1:200000 IJ SOLN
INTRAMUSCULAR | Status: DC | PRN
  Administered 2020-06-28: 30 mL

## 2020-06-28 MED ORDER — FAMOTIDINE 20 MG PO TABS
ORAL_TABLET | ORAL | Status: AC
  Administered 2020-06-28: 20 mg via ORAL
  Filled 2020-06-28: qty 1

## 2020-06-28 MED ORDER — ONDANSETRON HCL 4 MG/2ML IJ SOLN
INTRAMUSCULAR | Status: DC | PRN
  Administered 2020-06-28: 4 mg via INTRAVENOUS

## 2020-06-28 MED ORDER — ALBUMIN HUMAN 5 % IV SOLN: INTRAVENOUS | Status: DC | PRN

## 2020-06-28 MED ORDER — SODIUM CHLORIDE 0.9 % IV SOLN
INTRAVENOUS | Status: DC | PRN
  Administered 2020-06-28: 70 mL

## 2020-06-28 MED ORDER — MORPHINE SULFATE (PF) 4 MG/ML IV SOLN: 4.0000 mg | INTRAVENOUS | Status: DC | PRN

## 2020-06-28 MED ORDER — GABAPENTIN 300 MG PO CAPS
300.0000 mg | ORAL_CAPSULE | Freq: Two times a day (BID) | ORAL | Status: DC
  Administered 2020-06-28 – 2020-07-03 (×10): 300 mg via ORAL
  Filled 2020-06-28 (×10): qty 1

## 2020-06-28 MED ORDER — PROPOFOL 10 MG/ML IV BOLUS
INTRAVENOUS | Status: DC | PRN
  Administered 2020-06-28: 200 mg via INTRAVENOUS

## 2020-06-28 MED ORDER — KETAMINE HCL 10 MG/ML IJ SOLN
INTRAMUSCULAR | Status: DC | PRN
  Administered 2020-06-28: 20 mg via INTRAVENOUS
  Administered 2020-06-28: 10 mg via INTRAVENOUS
  Administered 2020-06-28: 30 mg via INTRAVENOUS

## 2020-06-28 MED ORDER — ONDANSETRON HCL 4 MG/2ML IJ SOLN
INTRAMUSCULAR | Status: AC
  Filled 2020-06-28: qty 2

## 2020-06-28 MED ORDER — SODIUM CHLORIDE 0.9 % IV SOLN
2.0000 g | Freq: Once | INTRAVENOUS | Status: AC
  Administered 2020-06-28: 2 g via INTRAVENOUS
  Filled 2020-06-28: qty 2

## 2020-06-28 MED ORDER — ACETAMINOPHEN 500 MG PO TABS: 1000.0000 mg | ORAL_TABLET | Freq: Once | ORAL | Status: AC

## 2020-06-28 MED ORDER — SODIUM CHLORIDE 0.9 % IV SOLN
2.0000 g | Freq: Once | INTRAVENOUS | Status: AC
  Administered 2020-06-28: 2 g via INTRAVENOUS

## 2020-06-28 MED ORDER — ONDANSETRON 4 MG PO TBDP: 4.0000 mg | ORAL_TABLET | Freq: Four times a day (QID) | ORAL | Status: DC | PRN

## 2020-06-28 MED ORDER — SODIUM CHLORIDE 0.9 % IV SOLN
INTRAVENOUS | Status: AC
  Filled 2020-06-28: qty 2

## 2020-06-28 MED ORDER — LACTATED RINGERS IV SOLN: INTRAVENOUS | Status: DC

## 2020-06-28 MED ORDER — HYDROMORPHONE HCL 1 MG/ML IJ SOLN
INTRAMUSCULAR | Status: DC | PRN
Start: 2020-06-28 — End: 2020-06-28
  Administered 2020-06-28: 1 mg via INTRAVENOUS

## 2020-06-28 MED ORDER — PANTOPRAZOLE SODIUM 40 MG IV SOLR
40.0000 mg | Freq: Every day | INTRAVENOUS | Status: DC
  Administered 2020-06-28: 40 mg via INTRAVENOUS
  Filled 2020-06-28: qty 40

## 2020-06-28 MED ORDER — SODIUM CHLORIDE (PF) 0.9 % IJ SOLN
INTRAMUSCULAR | Status: AC
  Filled 2020-06-28: qty 50

## 2020-06-28 MED ORDER — GLYCOPYRROLATE 0.2 MG/ML IJ SOLN
INTRAMUSCULAR | Status: DC | PRN
  Administered 2020-06-28: .2 mg via INTRAVENOUS

## 2020-06-28 MED ORDER — LIDOCAINE HCL (PF) 2 % IJ SOLN
INTRAMUSCULAR | Status: AC
  Filled 2020-06-28: qty 5

## 2020-06-28 MED ORDER — ALBUMIN HUMAN 5 % IV SOLN
INTRAVENOUS | Status: AC
  Filled 2020-06-28: qty 500

## 2020-06-28 MED ORDER — HYDROMORPHONE HCL 1 MG/ML IJ SOLN
INTRAMUSCULAR | Status: AC
  Filled 2020-06-28: qty 1

## 2020-06-28 MED ORDER — GLYCOPYRROLATE 0.2 MG/ML IJ SOLN
INTRAMUSCULAR | Status: AC
  Filled 2020-06-28: qty 1

## 2020-06-28 MED ORDER — BUPIVACAINE LIPOSOME 1.3 % IJ SUSP: 20.0000 mL | Freq: Once | INTRAMUSCULAR | Status: DC

## 2020-06-28 MED ORDER — ACETAMINOPHEN 10 MG/ML IV SOLN
INTRAVENOUS | Status: AC
  Filled 2020-06-28: qty 100

## 2020-06-28 MED ORDER — KETAMINE HCL 50 MG/ML IJ SOLN
INTRAMUSCULAR | Status: AC
  Filled 2020-06-28: qty 10

## 2020-06-28 MED ORDER — DEXAMETHASONE SODIUM PHOSPHATE 10 MG/ML IJ SOLN
INTRAMUSCULAR | Status: AC
  Filled 2020-06-28: qty 1

## 2020-06-28 MED ORDER — ALVIMOPAN 12 MG PO CAPS
ORAL_CAPSULE | ORAL | Status: AC
  Administered 2020-06-28: 12 mg via ORAL
  Filled 2020-06-28: qty 1

## 2020-06-28 MED ORDER — SODIUM CHLORIDE 0.9 % IV SOLN
INTRAVENOUS | Status: DC | PRN
  Administered 2020-06-28: 50 ug/min via INTRAVENOUS

## 2020-06-28 MED ORDER — LORATADINE 10 MG PO TABS: 10.0000 mg | ORAL_TABLET | Freq: Every day | ORAL | Status: DC | PRN

## 2020-06-28 MED ORDER — MIDAZOLAM HCL 2 MG/2ML IJ SOLN
INTRAMUSCULAR | Status: DC | PRN
  Administered 2020-06-28: 2 mg via INTRAVENOUS

## 2020-06-28 MED ORDER — FENTANYL CITRATE (PF) 100 MCG/2ML IJ SOLN: 25.0000 ug | INTRAMUSCULAR | Status: DC | PRN

## 2020-06-28 MED ORDER — ACETAMINOPHEN 500 MG PO TABS
ORAL_TABLET | ORAL | Status: AC
  Administered 2020-06-28: 1000 mg via ORAL
  Filled 2020-06-28: qty 2

## 2020-06-28 MED ORDER — FAMOTIDINE 20 MG PO TABS: 20.0000 mg | ORAL_TABLET | Freq: Once | ORAL | Status: AC

## 2020-06-28 MED ORDER — SUGAMMADEX SODIUM 500 MG/5ML IV SOLN
INTRAVENOUS | Status: AC
  Filled 2020-06-28: qty 5

## 2020-06-28 MED ORDER — EPHEDRINE 5 MG/ML INJ
INTRAVENOUS | Status: AC
  Filled 2020-06-28: qty 10

## 2020-06-28 MED ORDER — EPHEDRINE SULFATE 50 MG/ML IJ SOLN
INTRAMUSCULAR | Status: DC | PRN
  Administered 2020-06-28 (×4): 5 mg via INTRAVENOUS

## 2020-06-28 MED ORDER — LACTATED RINGERS IV SOLN: INTRAVENOUS | Status: DC | PRN

## 2020-06-28 MED ORDER — CARVEDILOL 12.5 MG PO TABS
12.5000 mg | ORAL_TABLET | Freq: Two times a day (BID) | ORAL | Status: DC
  Administered 2020-06-28 – 2020-07-03 (×10): 12.5 mg via ORAL
  Filled 2020-06-28 (×10): qty 1

## 2020-06-28 MED ORDER — SUCCINYLCHOLINE CHLORIDE 200 MG/10ML IV SOSY
PREFILLED_SYRINGE | INTRAVENOUS | Status: AC
  Filled 2020-06-28: qty 10

## 2020-06-28 MED ORDER — ORAL CARE MOUTH RINSE: 15.0000 mL | Freq: Once | OROMUCOSAL | Status: AC

## 2020-06-28 MED ORDER — CHLORHEXIDINE GLUCONATE 0.12 % MT SOLN: 15.0000 mL | Freq: Once | OROMUCOSAL | Status: AC

## 2020-06-28 MED ORDER — FENTANYL CITRATE (PF) 250 MCG/5ML IJ SOLN
INTRAMUSCULAR | Status: AC
  Filled 2020-06-28: qty 5

## 2020-06-28 MED ORDER — NICOTINE 14 MG/24HR TD PT24
14.0000 mg | MEDICATED_PATCH | Freq: Every day | TRANSDERMAL | Status: DC
  Administered 2020-06-28 – 2020-07-02 (×5): 14 mg via TRANSDERMAL
  Filled 2020-06-28 (×6): qty 1

## 2020-06-28 MED ORDER — ENOXAPARIN SODIUM 40 MG/0.4ML ~~LOC~~ SOLN
40.0000 mg | SUBCUTANEOUS | Status: DC
  Administered 2020-07-01: 40 mg via SUBCUTANEOUS
  Filled 2020-06-28 (×4): qty 0.4

## 2020-06-28 MED ORDER — ACETAMINOPHEN 10 MG/ML IV SOLN
INTRAVENOUS | Status: DC | PRN
  Administered 2020-06-28: 1000 mg via INTRAVENOUS

## 2020-06-28 MED ORDER — VASOPRESSIN 20 UNIT/ML IV SOLN
INTRAVENOUS | Status: DC | PRN
  Administered 2020-06-28: 1 [IU] via INTRAVENOUS

## 2020-06-28 SURGICAL SUPPLY — 114 items
BLADE CLIPPER SURG (BLADE) ×2 IMPLANT
BLADE SURG SZ10 CARB STEEL (BLADE) ×2 IMPLANT
BLADE SURG SZ11 CARB STEEL (BLADE) ×2 IMPLANT
BNDG GAUZE 4.5X4.1 6PLY STRL (MISCELLANEOUS) ×2 IMPLANT
BULB RESERV EVAC DRAIN JP 100C (MISCELLANEOUS) ×2 IMPLANT
CANISTER SUCT 1200ML W/VALVE (MISCELLANEOUS) IMPLANT
CANNULA REDUC XI 12-8 STAPL (CANNULA) ×1
CANNULA REDUCER 12-8 DVNC XI (CANNULA) ×1 IMPLANT
CHLORAPREP W/TINT 26 (MISCELLANEOUS) ×2 IMPLANT
CLIP VESOLOCK MED LG 6/CT (CLIP) IMPLANT
COVER TIP SHEARS 8 DVNC (MISCELLANEOUS) ×1 IMPLANT
COVER TIP SHEARS 8MM DA VINCI (MISCELLANEOUS) ×1
COVER WAND RF STERILE (DRAPES) ×2 IMPLANT
DEFOGGER SCOPE WARMER CLEARIFY (MISCELLANEOUS) ×2 IMPLANT
DERMABOND ADVANCED (GAUZE/BANDAGES/DRESSINGS) ×1
DERMABOND ADVANCED .7 DNX12 (GAUZE/BANDAGES/DRESSINGS) ×1 IMPLANT
DRAIN CHANNEL JP 19F (MISCELLANEOUS) ×2 IMPLANT
DRAPE 3/4 80X56 (DRAPES) ×2 IMPLANT
DRAPE ARM DVNC X/XI (DISPOSABLE) ×4 IMPLANT
DRAPE COLUMN DVNC XI (DISPOSABLE) ×1 IMPLANT
DRAPE DA VINCI XI ARM (DISPOSABLE) ×4
DRAPE DA VINCI XI COLUMN (DISPOSABLE) ×1
DRAPE INCISE IOBAN 66X45 STRL (DRAPES) ×2 IMPLANT
DRAPE UNDER BUTTOCK W/FLU (DRAPES) ×2 IMPLANT
DRSG OPSITE POSTOP 3X4 (GAUZE/BANDAGES/DRESSINGS) ×8 IMPLANT
DRSG OPSITE POSTOP 4X10 (GAUZE/BANDAGES/DRESSINGS) IMPLANT
DRSG OPSITE POSTOP 4X8 (GAUZE/BANDAGES/DRESSINGS) ×2 IMPLANT
ELECT BLADE 6.5 EXT (BLADE) IMPLANT
ELECT CAUTERY BLADE 6.4 (BLADE) ×2 IMPLANT
ELECT REM PT RETURN 9FT ADLT (ELECTROSURGICAL) ×2
ELECTRODE REM PT RTRN 9FT ADLT (ELECTROSURGICAL) ×1 IMPLANT
GAUZE PACKING 1/2X5YD (GAUZE/BANDAGES/DRESSINGS) ×2 IMPLANT
GAUZE SPONGE 4X4 12PLY STRL (GAUZE/BANDAGES/DRESSINGS) ×2 IMPLANT
GLOVE BIO SURGEON STRL SZ 6.5 (GLOVE) ×12 IMPLANT
GLOVE BIOGEL PI IND STRL 6.5 (GLOVE) ×5 IMPLANT
GLOVE BIOGEL PI INDICATOR 6.5 (GLOVE) ×5
GOWN STRL REUS W/ TWL LRG LVL3 (GOWN DISPOSABLE) ×9 IMPLANT
GOWN STRL REUS W/TWL LRG LVL3 (GOWN DISPOSABLE) ×9
GRASPER SUT TROCAR 14GX15 (MISCELLANEOUS) ×2 IMPLANT
HANDLE YANKAUER SUCT BULB TIP (MISCELLANEOUS) ×2 IMPLANT
IRRIGATION STRYKERFLOW (MISCELLANEOUS) ×1 IMPLANT
IRRIGATOR STRYKERFLOW (MISCELLANEOUS) ×2
IRRIGATOR SUCT 8 DISP DVNC XI (IRRIGATION / IRRIGATOR) IMPLANT
IRRIGATOR SUCTION 8MM XI DISP (IRRIGATION / IRRIGATOR)
IV NS 1000ML (IV SOLUTION) ×1
IV NS 1000ML BAXH (IV SOLUTION) ×1 IMPLANT
KIT PINK PAD W/HEAD ARE REST (MISCELLANEOUS) ×2
KIT PINK PAD W/HEAD ARM REST (MISCELLANEOUS) ×1 IMPLANT
LABEL OR SOLS (LABEL) ×2 IMPLANT
LEGGING LITHOTOMY PAIR STRL (DRAPES) ×2 IMPLANT
LIGASURE IMPACT 36 18CM CVD LR (INSTRUMENTS) ×2 IMPLANT
MANIFOLD NEPTUNE II (INSTRUMENTS) ×2 IMPLANT
NEEDLE HYPO 22GX1.5 SAFETY (NEEDLE) ×2 IMPLANT
NEEDLE INSUFFLATION 14GA 120MM (NEEDLE) ×2 IMPLANT
OBTURATOR OPTICAL STANDARD 8MM (TROCAR) ×1
OBTURATOR OPTICAL STND 8 DVNC (TROCAR) ×1
OBTURATOR OPTICALSTD 8 DVNC (TROCAR) ×1 IMPLANT
PACK COLON CLEAN CLOSURE (MISCELLANEOUS) ×2 IMPLANT
PACK LAP CHOLECYSTECTOMY (MISCELLANEOUS) ×2 IMPLANT
PENCIL ELECTRO HAND CTR (MISCELLANEOUS) ×2 IMPLANT
PORT ACCESS TROCAR AIRSEAL 5 (TROCAR) ×2 IMPLANT
RELOAD PROXIMATE 75MM BLUE (ENDOMECHANICALS) ×8 IMPLANT
RELOAD STAPLER 3.5X45 BLU DVNC (STAPLE) IMPLANT
RELOAD STAPLER 3.5X60 BLU DVNC (STAPLE) IMPLANT
RETRACTOR RING XSMALL (MISCELLANEOUS) IMPLANT
RETRACTOR WND ALEXIS-O 25 LRG (MISCELLANEOUS) ×1 IMPLANT
RTRCTR WOUND ALEXIS 13CM XS SH (MISCELLANEOUS)
RTRCTR WOUND ALEXIS O 25CM LRG (MISCELLANEOUS) ×2
SEAL CANN UNIV 5-8 DVNC XI (MISCELLANEOUS) ×3 IMPLANT
SEAL XI 5MM-8MM UNIVERSAL (MISCELLANEOUS) ×3
SEALER VESSEL DA VINCI XI (MISCELLANEOUS)
SEALER VESSEL EXT DVNC XI (MISCELLANEOUS) IMPLANT
SET TRI-LUMEN FLTR TB AIRSEAL (TUBING) ×2 IMPLANT
SOL PREP PVP 2OZ (MISCELLANEOUS) ×2
SOLUTION ELECTROLUBE (MISCELLANEOUS) ×2 IMPLANT
SOLUTION PREP PVP 2OZ (MISCELLANEOUS) ×1 IMPLANT
SPONGE LAP 18X18 RF (DISPOSABLE) ×10 IMPLANT
SPONGE LAP 4X18 RFD (DISPOSABLE) ×2 IMPLANT
STAPLER 45 DA VINCI SURE FORM (STAPLE)
STAPLER 45 SUREFORM DVNC (STAPLE) IMPLANT
STAPLER 60 DA VINCI SURE FORM (STAPLE)
STAPLER 60 SUREFORM DVNC (STAPLE) IMPLANT
STAPLER CANNULA SEAL DVNC XI (STAPLE) ×1 IMPLANT
STAPLER CANNULA SEAL XI (STAPLE) ×1
STAPLER CIRCULAR MANUAL XL 29 (STAPLE) ×2 IMPLANT
STAPLER CUT CVD 40MM BLUE (STAPLE) ×2 IMPLANT
STAPLER PROXIMATE 75MM BLUE (STAPLE) ×2 IMPLANT
STAPLER RELOAD 3.5X45 BLU DVNC (STAPLE)
STAPLER RELOAD 3.5X45 BLUE (STAPLE)
STAPLER RELOAD 3.5X60 BLU DVNC (STAPLE)
STAPLER RELOAD 3.5X60 BLUE (STAPLE)
STAPLER SKIN PROX 35W (STAPLE) ×2 IMPLANT
STAPLER SYS INTERNAL RELOAD SS (MISCELLANEOUS) ×4 IMPLANT
SURGILUBE 2OZ TUBE FLIPTOP (MISCELLANEOUS) ×4 IMPLANT
SUT MNCRL 4-0 (SUTURE) ×2
SUT MNCRL 4-0 27XMFL (SUTURE) ×2
SUT NYLON 2-0 (SUTURE) ×2 IMPLANT
SUT PDS 2-0 27IN (SUTURE) ×2 IMPLANT
SUT PDS PLUS 0 (SUTURE) ×2
SUT PDS PLUS AB 0 CT-2 (SUTURE) ×2 IMPLANT
SUT SILK 0 SH 30 (SUTURE) ×4 IMPLANT
SUT SILK 3-0 (SUTURE) ×2 IMPLANT
SUT VIC AB 3-0 SH 27 (SUTURE) ×4
SUT VIC AB 3-0 SH 27X BRD (SUTURE) ×4 IMPLANT
SUT VICRYL 0 AB UR-6 (SUTURE) ×4 IMPLANT
SUT VLOC 90 6 CV-15 VIOLET (SUTURE) ×4 IMPLANT
SUTURE MNCRL 4-0 27XMF (SUTURE) ×2 IMPLANT
SYR 30ML LL (SYRINGE) ×2 IMPLANT
SYS LAPSCP GELPORT 120MM (MISCELLANEOUS) ×2
SYS TROCAR 1.5-3 SLV ABD GEL (ENDOMECHANICALS)
SYSTEM LAPSCP GELPORT 120MM (MISCELLANEOUS) ×1 IMPLANT
SYSTEM TROCR 1.5-3 SLV ABD GEL (ENDOMECHANICALS) IMPLANT
SYSTEM WECK SHIELD CLOSURE (TROCAR) ×2 IMPLANT
TRAY FOLEY MTR SLVR 16FR STAT (SET/KITS/TRAYS/PACK) ×2 IMPLANT

## 2020-06-28 NOTE — Anesthesia Procedure Notes (Signed)
Procedure Name: Intubation Date/Time: 06/28/2020 9:59 AM Performed by: Doreen Salvage, CRNA Pre-anesthesia Checklist: Patient identified, Patient being monitored, Timeout performed, Emergency Drugs available and Suction available Patient Re-evaluated:Patient Re-evaluated prior to induction Oxygen Delivery Method: Circle system utilized Preoxygenation: Pre-oxygenation with 100% oxygen Induction Type: IV induction Ventilation: Mask ventilation without difficulty Laryngoscope Size: Mac, 4 and McGraph Grade View: Grade I Tube type: Oral Tube size: 7.5 mm Number of attempts: 1 Airway Equipment and Method: Stylet Placement Confirmation: ETT inserted through vocal cords under direct vision,  positive ETCO2 and breath sounds checked- equal and bilateral Secured at: 21 (at the teeth) cm Tube secured with: Tape Dental Injury: Teeth and Oropharynx as per pre-operative assessment  Difficulty Due To: Difficult Airway- due to limited oral opening

## 2020-06-28 NOTE — Anesthesia Postprocedure Evaluation (Signed)
Anesthesia Post Note  Patient: Craig [name]  Procedure(s) Performed: XI ROBOTIC ASSISTED COLOSTOMY TAKEDOWN CONVERTED TO OPEN PROCEDURE (N/A Abdomen)  Patient location during evaluation: PACU Anesthesia Type: General Level of consciousness: awake Pain management: satisfactory to patient Vital Signs Assessment: post-procedure vital signs reviewed and stable Respiratory status: spontaneous breathing Cardiovascular status: blood pressure returned to baseline Postop Assessment: no apparent nausea or vomiting Anesthetic complications: no   No complications documented.   Last Vitals:  Vitals:   06/28/20 0720  BP: 112/68  Pulse: 97  Resp: 16  Temp: 37.2 C  SpO2: 100%    Last Pain:  Vitals:   06/28/20 0720  TempSrc: Oral  PainSc: 2                  Neva Seat

## 2020-06-28 NOTE — Interval H&P Note (Signed)
History and Physical Interval Note:  06/28/2020 7:39 AM  Craig [name]  has presented today for surgery, with the diagnosis of Z93.0 colostomy status.  The various methods of treatment have been discussed with the patient and family. After consideration of risks, benefits and other options for treatment, the patient has consented to  Procedure(s): XI ROBOTIC ASSISTED COLOSTOMY TAKEDOWN (N/A) as a surgical intervention.  The patient's history has been reviewed, patient examined, no change in status, stable for surgery.  I have reviewed the patient's chart and labs.  Questions were answered to the patient's satisfaction.     Herbert Pun

## 2020-06-28 NOTE — Progress Notes (Addendum)
   06/28/20 0743  Clinical Encounter Type  Visited With Family  Visit Type Initial  Referral From Chaplain  Consult/Referral To Chaplain  While rounding SDS waiting area, chaplain visited with Pt's wife. Chaplain commented on her cup that read, Faith over Fear. They talked about that for a few minutes and chaplain asked if she had any questions or concerns and she said no. Chaplain made sure she was receiving text regarding Pt. Pt's wife confirmed that Pt is staying tonight.

## 2020-06-28 NOTE — Transfer of Care (Addendum)
Immediate Anesthesia Transfer of Care Note  Patient: Craig [name]  Procedure(s) Performed: XI ROBOTIC ASSISTED COLOSTOMY TAKEDOWN CONVERTED TO OPEN PROCEDURE (N/A Abdomen)  Patient Location: PACU  Anesthesia Type:General  Level of Consciousness: awake, drowsy and patient cooperative  Airway & Oxygen Therapy: Patient Spontanous Breathing and Patient connected to face mask oxygen  Post-op Assessment: Report given to RN, Post -op Vital signs reviewed and stable and Patient moving all extremities  Post vital signs: Reviewed and stable  Last Vitals:  Vitals Value Taken Time  BP 140/102 06/28/20 1509  Temp 36.6 C 06/28/20 1509  Pulse 91 06/28/20 1517  Resp 18 06/28/20 1517  SpO2 100 % 06/28/20 1517  Vitals shown include unvalidated device data.  Last Pain:  Vitals:   06/28/20 1509  TempSrc:   PainSc: 0-No pain         Complications: No complications documented.

## 2020-06-28 NOTE — Anesthesia Preprocedure Evaluation (Signed)
Anesthesia Evaluation  Patient identified by MRN, date of birth, ID band Patient awake    Reviewed: Allergy & Precautions, H&P , NPO status , Patient's Chart, lab work & pertinent test results  History of Anesthesia Complications Negative for: history of anesthetic complications  Airway Mallampati: III  TM Distance: >3 FB Neck ROM: full    Dental  (+) Chipped, Dental Advidsory Given, Teeth Intact   Pulmonary neg shortness of breath, neg COPD, neg recent URI, former smoker,           Cardiovascular Exercise Tolerance: Good hypertension, (-) angina(-) Past MI and (-) DOE negative cardio ROS       Neuro/Psych negative neurological ROS  negative psych ROS   GI/Hepatic negative GI ROS, Neg liver ROS, neg GERD  ,  Endo/Other  negative endocrine ROS  Renal/GU negative Renal ROS     Musculoskeletal   Abdominal   Peds  Hematology negative hematology ROS (+)   Anesthesia Other Findings Free Air in abdomen from bowel perf  BMI    Body Mass Index: 34.03 kg/m      Reproductive/Obstetrics negative OB ROS                             Anesthesia Physical  Anesthesia Plan  ASA: II  Anesthesia Plan: General   Post-op Pain Management:    Induction: Intravenous  PONV Risk Score and Plan: Ondansetron, Dexamethasone, Midazolam, Treatment may vary due to age or medical condition and Promethazine  Airway Management Planned: Oral ETT  Additional Equipment:   Intra-op Plan:   Post-operative Plan: Extubation in OR  Informed Consent: I have reviewed the patients History and Physical, chart, labs and discussed the procedure including the risks, benefits and alternatives for the proposed anesthesia with the patient or authorized representative who has indicated his/her understanding and acceptance.     Dental Advisory Given  Plan Discussed with: Anesthesiologist, CRNA and Surgeon  Anesthesia  Plan Comments: (Patient consented for risks of anesthesia including but not limited to:  - adverse reactions to medications - damage to teeth, lips or other oral mucosa - sore throat or hoarseness - Damage to heart, brain, lungs or loss of life  Patient voiced understanding.)        Anesthesia Quick Evaluation

## 2020-06-28 NOTE — Progress Notes (Signed)
Pt educated on Incentive spirometer.

## 2020-06-28 NOTE — Op Note (Signed)
Preoperative diagnosis: Colostomy status  Postoperative diagnosis: Colostomy status  Procedure: Attempted Robotic assisted laparoscopic colostomy takedown                      Converted to open colostomy takedown                       Small bowel resection                       Splenic flexure takedown                       Parastomal hernia repair   Anesthesia: GETA   Surgeon: Herbert Pun, MD  Assistant: Dr. [name]    Wound Classification: Clean contaminated   Specimen: Sigmoid colon                     Small bowel   Complications: None   Estimated Blood Loss: 100 mL   Indications: Patient is a 29 y.o. male with descending colon and colostomy due to history of perforated diverticulitis. Elective colostomy reversal was indicated.     FIndings: 1.   Completely adhered to loop of small bowel to rectal stump 2.  Adequate hemostasis.  3.   Tension-free anastomosis   Description of procedure: The patient was placed on the operating table in the lithotomy position, both arms tucked. General anesthesia was induced. A time-out was completed verifying correct patient, procedure, site, positioning, and implant(s) and/or special equipment prior to beginning this procedure. The abdomen was prepped and draped in the usual sterile fashion.    Veress needle inserted in the left upper quadrant.  Abdominal cavity was insufflated to 15 mmHg. Patient tolerated insufflation well. 4 trochars were placed on the right hemiabdomen. 5 mm assistant port was then placed on the right subcostal area.  No injuries from trocar placements were noted. The table was placed in the Trendelenburg position.  Xi robotic platform was then brought to the operative field and docked at an angle from the left lower quadrant.  Tip up grasper and fenestrated bipolar were placed in the left arm ports.  Scissors were placed in right arm port.  Abundant amount of adhesions were identified in the pelvis and left lower  quadrant. Time-consuming lysis of adhesion was done with scissors.    After lysis of adhesions, the rectal tone was identified with the Prolene stitch. Due to the multiple loops of small bowel and here to the rectal exam he was unable to be separated robotically.  The descending colon was mobilized from the lateral attachment following the [employer]. This was followed up to the splenic flexure. The splenic flexure was taken down and mobilized to let the distal end of the descending colon reach the rectum without tension. Then the colostomy was approached robotically. A large parastomal hernia was identified. The parastomal hernia was reduced with blunt instruments. The hernia sac was dissected carefully.  At this point decision was made to convert the procedure to open colostomy takedown to be able to evaluate the small bowel attached to the rectal stump. A midline incision was done infraumbilically. Dissection was carried down to fascia. Fascia was opened and pneumoperitoneum was evacuated. A large wound protector was used. The rectal stump and the small bowel was able to be palpated. There was unable to be separated. Decision was made to perform a small bowel resection. The  anterior and distal point of transection where identified. A small window on the mesentery was done. The small bowel was divided with GIA. The mesentery was divided with LigaSure device. A side-to-side anastomosis was done with GIA. Enterotomy was closed with 3 oh V-Loc. Mesenteric rent was closed.  An elliptical incision was done around the colostomy. Dissection was carried down to the reminder of the hernia sac. The descending colon was able to be removed from the colostomy incision inserting to the abdomen and exteriorized through the midline. Again the parastomal hernia was approached. The hernia sac was completely excised. The anterior fascia was dissected free from the hernia sac. The posterior fascia was initially  closed with 2-0 PDS. The anterior fascia was closed with 0 PDS to repair the hernia.  Attention was placed to the rectosigmoid stump. The sigmoid portion of the rectosigmoid stump was dissected down to the pelvis. The sigmoid was divided with contour device.  The descending colon was divided with pursestring device. An anvil was inserted through the colotomy and advanced proximally. Once the descending colon was identified reaching the rectum without tension, the assistant surgeon introduced the 29 mm EEA rectally. It was guided under direct vision up to the level of the distal staple line on the rectal stump. The spike of the EEA device was then deployed to pierce the rectal stump, the anvil was then attached to this spike, and the EEA device is closed and fired to perform a stapled end-to-end anastomosis. The doughnuts produced by the EEA stapler were checked to ensure that they were complete. Furthermore, an air leak test was carried out by insufflating air gently into the rectum, while the anastomosis was bathed underwater. A clamp was placed on the proximal colon to prevent its distention. A small air leak was identified and was able to be repaired with a silk suture. A second early trial was done without any air leak identified. Once the anastomosis was properly tested, the patient was repositioned back in the normal anatomical position. Vistaseal was placed around the anastomosis. A 19 French drain was left in the pelvis. The trocars were removed under direct vision. Using a clean closure technique, the fascia of the midline was closed with a #0 PDS suture. The skin was staples and a dry sterile dressing is applied. The colostomy wound was partially closed and left with wet-to-dry packing. The sponge and instrument count were correct, and there were no complications.    The patient tolerated the procedure well, awakened from anesthesia and was taken to the postanesthesia care unit in satisfactory  condition.  Foley still in place.  Sponge count and instrument count correct at the end of the procedure.  Herbert Pun, MD, FACS

## 2020-06-29 ENCOUNTER — Encounter: Payer: Self-pay | Admitting: General Surgery

## 2020-06-29 LAB — BASIC METABOLIC PANEL
Anion gap: 8 (ref 5–15)
BUN: 7 mg/dL (ref 6–20)
CO2: 24 mmol/L (ref 22–32)
Calcium: 8.6 mg/dL — ABNORMAL LOW (ref 8.9–10.3)
Chloride: 106 mmol/L (ref 98–111)
Creatinine, Ser: 0.64 mg/dL (ref 0.61–1.24)
GFR, Estimated: >60 mL/min (ref >60)
Glucose, Bld: 135 mg/dL — ABNORMAL HIGH (ref 70–99)
Potassium: 3.9 mmol/L (ref 3.5–5.1)
Sodium: 138 mmol/L (ref 135–145)

## 2020-06-29 LAB — CBC
HCT: 30.4 % — ABNORMAL LOW (ref 39.0–52.0)
Hemoglobin: 10.6 g/dL — ABNORMAL LOW (ref 13.0–17.0)
MCH: 34.2 pg — ABNORMAL HIGH (ref 26.0–34.0)
MCHC: 34.9 g/dL (ref 30.0–36.0)
MCV: 98.1 fL (ref 80.0–100.0)
Platelets: 220 10*3/uL (ref 150–400)
RBC: 3.1 MIL/uL — ABNORMAL LOW (ref 4.22–5.81)
RDW: 12.7 % (ref 11.5–15.5)
WBC: 15.8 10*3/uL — ABNORMAL HIGH (ref 4.0–10.5)
nRBC: 0 % (ref 0.0–0.2)

## 2020-06-29 LAB — PHOSPHORUS: Phosphorus: 3.5 mg/dL (ref 2.5–4.6)

## 2020-06-29 LAB — MAGNESIUM: Magnesium: 1.7 mg/dL (ref 1.7–2.4)

## 2020-06-29 MED ORDER — GUAIFENESIN-DM 100-10 MG/5ML PO SYRP
5.0000 mL | ORAL_SOLUTION | ORAL | Status: DC | PRN
  Administered 2020-06-29: 5 mL via ORAL
  Filled 2020-06-29: qty 5

## 2020-06-29 MED ORDER — PANTOPRAZOLE SODIUM 40 MG PO TBEC
40.0000 mg | DELAYED_RELEASE_TABLET | Freq: Every day | ORAL | Status: DC
  Administered 2020-06-29 – 2020-07-02 (×4): 40 mg via ORAL
  Filled 2020-06-29 (×4): qty 1

## 2020-06-29 MED ORDER — CHLORHEXIDINE GLUCONATE CLOTH 2 % EX PADS
6.0000 | MEDICATED_PAD | Freq: Every day | CUTANEOUS | Status: DC
  Administered 2020-06-29: 6 via TOPICAL

## 2020-06-29 NOTE — Progress Notes (Signed)
[place] Hospital Day(s): 1.   Post op day(s): 1 Day Post-Op.   Interval History: Patient seen and examined, no acute events or new complaints overnight. Patient reports feeling sore in the lower abdomen.  He denies any nausea or vomiting.  He denies passing gas.  He does feel intestinal moving.  He denies any fever or chills.  Vital signs in last 24 hours: [min-max] current  Temp:  [97.8 F (36.6 C)-98.6 F (37 C)] 98.1 F (36.7 C) (11/10 0728) Pulse Rate:  [88-99] 88 (11/10 0728) Resp:  [16-20] 20 (11/10 0728) BP: (104-143)/(52-102) 143/92 (11/10 0728) SpO2:  [94 %-100 %] 98 % (11/10 0728)       Weight: 120.2 kg BMI (Calculated): 35.93   Physical Exam:  Constitutional: alert, cooperative and no distress  Respiratory: breathing non-labored at rest  Cardiovascular: regular rate and sinus rhythm  Gastrointestinal: soft, tender to palpation lower abdomen, and non-distended  Labs:  CBC Latest Ref Rng & Units 06/29/2020 11/29/2019 11/27/2019  WBC 4.0 - 10.5 K/uL 15.8(H) 17.9(H) 15.3(H)  Hemoglobin 13.0 - 17.0 g/dL 10.6(L) 13.3 12.6(L)  Hematocrit 39 - 52 % 30.4(L) 40.1 37.3(L)  Platelets 150 - 400 K/uL 220 408(H) 366   CMP Latest Ref Rng & Units 06/29/2020 11/29/2019 11/28/2019  Glucose 70 - 99 mg/dL 135(H) 100(H) 86  BUN 6 - 20 mg/dL 7 12 16   Creatinine 0.61 - 1.24 mg/dL 0.64 0.66 0.54(L)  Sodium 135 - 145 mmol/L 138 134(L) 137  Potassium 3.5 - 5.1 mmol/L 3.9 3.8 4.0  Chloride 98 - 111 mmol/L 106 104 105  CO2 22 - 32 mmol/L 24 23 25   Calcium 8.9 - 10.3 mg/dL 8.6(L) 8.0(L) 7.8(L)  Total Protein 6.5 - 8.1 g/dL - - -  Total Bilirubin 0.3 - 1.2 mg/dL - - -  Alkaline Phos 38 - 126 U/L - - -  AST 15 - 41 U/L - - -  ALT 0 - 44 U/L - - -    Imaging studies: No new pertinent imaging studies   Assessment/Plan:  29 y.o. male with colostomy status 1 Day Post-Op s/p colostomy takedown, complicated by pertinent comorbidities including gout.  Patient without any  clinical alteration.  Patient is afebrile with adequate vital signs.  Today continue with significant pain.  Patient still need IV pain medications.  Patient has not been able to ambulate.  I encouraged him to ambulate.  We will discontinue Foley catheter.  We will continue with clear liquid diet until GI function returns.  We will continue with DVT prophylaxis.  Arnold Long, MD

## 2020-06-29 NOTE — Progress Notes (Signed)
Patient complaining of a cough. Utilized general surgery prn medications and ordered Robitussin DM.

## 2020-06-29 NOTE — Progress Notes (Signed)
Foley cath removed at this time.

## 2020-06-29 NOTE — Progress Notes (Signed)
Mobility Specialist - Progress Note   06/29/20 1256  Mobility  Activity Refused mobility  Mobility performed by Mobility specialist    Pt refused mobility session at this time. Pt had just woken up. Pt states he's having "6/10 pain across lower abdomen". Will hold and re-attempt at a later date/time.    Tiandra Swoveland Mobility Specialist  06/29/20, 12:57 PM

## 2020-06-29 NOTE — Progress Notes (Signed)
PHARMACIST - PHYSICIAN COMMUNICATION  DR:   Windell Moment  CONCERNING: IV to Oral Route Change Policy  RECOMMENDATION: This patient is receiving pantoprazole by the intravenous route.  Based on criteria approved by the Pharmacy and Therapeutics Committee, the intravenous medication(s) is/are being converted to the equivalent oral dose form(s).   DESCRIPTION: These criteria include:  The patient is eating (either orally or via tube) and/or has been taking other orally administered medications for a least 24 hours  The patient has no evidence of active gastrointestinal bleeding or impaired GI absorption (gastrectomy, short bowel, patient on TNA or NPO).  If you have questions about this conversion, please contact the Pharmacy Department  []   [phone] )  [name] [x]   [phone] )  [facility] []   [phone] )  [facility] []   [phone] )  [facility] []   [phone] )  [place], [facility] 06/29/2020 12:39 PM

## 2020-06-30 LAB — CBC
HCT: 29.4 % — ABNORMAL LOW (ref 39.0–52.0)
Hemoglobin: 10.2 g/dL — ABNORMAL LOW (ref 13.0–17.0)
MCH: 33.9 pg (ref 26.0–34.0)
MCHC: 34.7 g/dL (ref 30.0–36.0)
MCV: 97.7 fL (ref 80.0–100.0)
Platelets: 218 10*3/uL (ref 150–400)
RBC: 3.01 MIL/uL — ABNORMAL LOW (ref 4.22–5.81)
RDW: 13 % (ref 11.5–15.5)
WBC: 14.5 10*3/uL — ABNORMAL HIGH (ref 4.0–10.5)
nRBC: 0 % (ref 0.0–0.2)

## 2020-06-30 LAB — BASIC METABOLIC PANEL
Anion gap: 9 (ref 5–15)
BUN: 11 mg/dL (ref 6–20)
CO2: 23 mmol/L (ref 22–32)
Calcium: 8.4 mg/dL — ABNORMAL LOW (ref 8.9–10.3)
Chloride: 104 mmol/L (ref 98–111)
Creatinine, Ser: 0.7 mg/dL (ref 0.61–1.24)
GFR, Estimated: >60 mL/min (ref >60)
Glucose, Bld: 112 mg/dL — ABNORMAL HIGH (ref 70–99)
Potassium: 3.6 mmol/L (ref 3.5–5.1)
Sodium: 136 mmol/L (ref 135–145)

## 2020-06-30 LAB — MAGNESIUM: Magnesium: 1.7 mg/dL (ref 1.7–2.4)

## 2020-06-30 LAB — PHOSPHORUS: Phosphorus: 3.9 mg/dL (ref 2.5–4.6)

## 2020-06-30 NOTE — Progress Notes (Signed)
[place] Hospital Day(s): 2.   Post op day(s): 2 Days Post-Op.   Interval History: Patient seen and examined, no acute events or new complaints overnight. Patient reports to be more comfortable today but still with significant pain.  Denies nausea or vomiting.  Report passing good gas.  Report having bowel movement..  Vital signs in last 24 hours: [min-max] current  Temp:  [98.2 F (36.8 C)-98.9 F (37.2 C)] 98.9 F (37.2 C) (11/11 2003) Pulse Rate:  [79-99] 86 (11/11 2003) Resp:  [16-18] 18 (11/11 2003) BP: (111-147)/(65-89) 132/80 (11/11 2003) SpO2:  [93 %-96 %] 96 % (11/11 2003)       Weight: 120.2 kg BMI (Calculated): 35.93   Physical Exam:  Constitutional: alert, cooperative and no distress  Respiratory: breathing non-labored at rest  Cardiovascular: regular rate and sinus rhythm  Gastrointestinal: soft, moderate tender to palpation, and non-distended  Labs:  CBC Latest Ref Rng & Units 06/30/2020 06/29/2020 11/29/2019  WBC 4.0 - 10.5 K/uL 14.5(H) 15.8(H) 17.9(H)  Hemoglobin 13.0 - 17.0 g/dL 10.2(L) 10.6(L) 13.3  Hematocrit 39 - 52 % 29.4(L) 30.4(L) 40.1  Platelets 150 - 400 K/uL 218 220 408(H)   CMP Latest Ref Rng & Units 06/30/2020 06/29/2020 11/29/2019  Glucose 70 - 99 mg/dL 112(H) 135(H) 100(H)  BUN 6 - 20 mg/dL 11 7 12   Creatinine 0.61 - 1.24 mg/dL 0.70 0.64 0.66  Sodium 135 - 145 mmol/L 136 138 134(L)  Potassium 3.5 - 5.1 mmol/L 3.6 3.9 3.8  Chloride 98 - 111 mmol/L 104 106 104  CO2 22 - 32 mmol/L 23 24 23   Calcium 8.9 - 10.3 mg/dL 8.4(L) 8.6(L) 8.0(L)  Total Protein 6.5 - 8.1 g/dL - - -  Total Bilirubin 0.3 - 1.2 mg/dL - - -  Alkaline Phos 38 - 126 U/L - - -  AST 15 - 41 U/L - - -  ALT 0 - 44 U/L - - -    Imaging studies: No new pertinent imaging studies   Assessment/Plan:  29 y.o. male with colostomy status 2 Day Post-Op s/p colostomy takedown, complicated by pertinent comorbidities including gout.  Patient recovering slowly.  Today a  little bit more comfortable.  Pain slowly improving.  Still with significant pain.  Passing gas. Will advance to full liquids.  We will continue with DVT prophylaxis.  Arnold Long, MD

## 2020-06-30 NOTE — Progress Notes (Signed)
Patient reports he has ambulated in his room today without difficulties.

## 2020-06-30 NOTE — Progress Notes (Signed)
Message sent to provider about patient's JP drain output. Patient in bed resting with no complaints. Will continue to monitor.

## 2020-07-01 LAB — SURGICAL PATHOLOGY

## 2020-07-01 NOTE — Progress Notes (Signed)
[place] Hospital Day(s): 3.   Post op day(s): 3 Days Post-Op.   Interval History: Patient seen and examined, no acute events or new complaints overnight. Patient reports still having pain that is slowly decreasing.  Denies any nausea or vomiting.  Reports not having significant appetite.  Report he continue passing stool and gas.  Vital signs in last 24 hours: [min-max] current  Temp:  [98.2 F (36.8 C)-99.1 F (37.3 C)] 99.1 F (37.3 C) (11/12 0507) Pulse Rate:  [79-89] 89 (11/12 0507) Resp:  [16-18] 18 (11/12 0507) BP: (111-143)/(65-80) 143/79 (11/12 0507) SpO2:  [95 %-97 %] 95 % (11/12 0507)     Height: 6' (182.9 cm) Weight: 120.2 kg BMI (Calculated): 35.93   Physical Exam:  Constitutional: alert, cooperative and no distress  Respiratory: breathing non-labored at rest  Cardiovascular: regular rate and sinus rhythm  Gastrointestinal: soft, moderate-tender, and non-distended.  Wounds are dry and clean  Labs:  CBC Latest Ref Rng & Units 06/30/2020 06/29/2020 11/29/2019  WBC 4.0 - 10.5 K/uL 14.5(H) 15.8(H) 17.9(H)  Hemoglobin 13.0 - 17.0 g/dL 10.2(L) 10.6(L) 13.3  Hematocrit 39 - 52 % 29.4(L) 30.4(L) 40.1  Platelets 150 - 400 K/uL 218 220 408(H)   CMP Latest Ref Rng & Units 06/30/2020 06/29/2020 11/29/2019  Glucose 70 - 99 mg/dL 112(H) 135(H) 100(H)  BUN 6 - 20 mg/dL 11 7 12   Creatinine 0.61 - 1.24 mg/dL 0.70 0.64 0.66  Sodium 135 - 145 mmol/L 136 138 134(L)  Potassium 3.5 - 5.1 mmol/L 3.6 3.9 3.8  Chloride 98 - 111 mmol/L 104 106 104  CO2 22 - 32 mmol/L 23 24 23   Calcium 8.9 - 10.3 mg/dL 8.4(L) 8.6(L) 8.0(L)  Total Protein 6.5 - 8.1 g/dL - - -  Total Bilirubin 0.3 - 1.2 mg/dL - - -  Alkaline Phos 38 - 126 U/L - - -  AST 15 - 41 U/L - - -  ALT 0 - 44 U/L - - -    Imaging studies: No new pertinent imaging studies   Assessment/Plan:  29 y.o. male with colostomy status 3 Day Post-Op s/p colostomy takedown, complicated by pertinent comorbidities  including gout.  Patient recovering slowly.  Continue passing gas.  Tolerated full liquids.  Will advance to soft diet.  I encouraged the patient to ambulate.  I change the dressings and the wounds are dry and clean.  We will continue with DVT prophylaxis.  We will continue with pain management.  Arnold Long, MD

## 2020-07-01 NOTE — Progress Notes (Signed)
Mobility Specialist - Progress Note   07/01/20 1400  Mobility  Activity Ambulated in hall  Level of Assistance Modified independent, requires aide device or extra time  Assistive Device  (Pt pushed IV pole)  Distance Ambulated (ft) 500 ft  Mobility Response Tolerated well  Mobility performed by Mobility specialist  $Mobility charge 1 Mobility    Pre-mobility: 88 HR, 95% SpO2 During mobility: 109 HR, 98% SpO2 Post-mobility: 95 HR, 96% SpO2   Pt was lying in bed upon arrival utilizing room air. Pt agreed to session. Pt c/o pain in abdomen "4/10". Pt was modA getting EOB d/t pain. Pt stood SBA. Pt ambulated 500' in room/hallway pushing own IV pole. No LOB noted. Pt denied SOB or weakness. No rest breaks taken. Upon return to room, pt c/o pain increasing to a 6/10. Pt minA returning to supine position, utilizing bed rails to pull self up and reposition to [facility]. Overall, pt tolerated session well. Pt was left in bed with needs in reach. Call bell not working. Nurse notified.    Kathee Delton Mobility Specialist 07/01/20, 2:54 PM

## 2020-07-01 NOTE — Progress Notes (Signed)
[place] Hospital Day(s): 3.   Post op day(s): 3 Days Post-Op.   Interval History: Patient seen and examined, no acute events or new complaints overnight. Patient reports starting to feel a little bit more comfortable.  He was able to ambulate in the room.  Denies nausea or vomiting.  Report passing gas.  Report having bowel movement.  Vital signs in last 24 hours: [min-max] current  Temp:  [98.2 F (36.8 C)-99.1 F (37.3 C)] 99.1 F (37.3 C) (11/12 0507) Pulse Rate:  [79-89] 89 (11/12 0507) Resp:  [16-18] 18 (11/12 0507) BP: (111-143)/(65-80) 143/79 (11/12 0507) SpO2:  [95 %-97 %] 95 % (11/12 0507)     Height: 6' (182.9 cm) Weight: 120.2 kg BMI (Calculated): 35.93   Physical Exam:  Constitutional: alert, cooperative and no distress  Respiratory: breathing non-labored at rest  Cardiovascular: regular rate and sinus rhythm  Gastrointestinal: soft, moderate-tender, and non-distended  Labs:  CBC Latest Ref Rng & Units 06/30/2020 06/29/2020 11/29/2019  WBC 4.0 - 10.5 K/uL 14.5(H) 15.8(H) 17.9(H)  Hemoglobin 13.0 - 17.0 g/dL 10.2(L) 10.6(L) 13.3  Hematocrit 39 - 52 % 29.4(L) 30.4(L) 40.1  Platelets 150 - 400 K/uL 218 220 408(H)   CMP Latest Ref Rng & Units 06/30/2020 06/29/2020 11/29/2019  Glucose 70 - 99 mg/dL 112(H) 135(H) 100(H)  BUN 6 - 20 mg/dL 11 7 12   Creatinine 0.61 - 1.24 mg/dL 0.70 0.64 0.66  Sodium 135 - 145 mmol/L 136 138 134(L)  Potassium 3.5 - 5.1 mmol/L 3.6 3.9 3.8  Chloride 98 - 111 mmol/L 104 106 104  CO2 22 - 32 mmol/L 23 24 23   Calcium 8.9 - 10.3 mg/dL 8.4(L) 8.6(L) 8.0(L)  Total Protein 6.5 - 8.1 g/dL - - -  Total Bilirubin 0.3 - 1.2 mg/dL - - -  Alkaline Phos 38 - 126 U/L - - -  AST 15 - 41 U/L - - -  ALT 0 - 44 U/L - - -    Imaging studies: No new pertinent imaging studies   Assessment/Plan:  29 y.o. male with colostomy status 2 Day Post-Op s/p colostomy takedown, complicated by pertinent comorbidities including gout.  Patient  recovering slowly.  Starting to feel a little bit more comfortable.  Was able to get out of bed.  I advance her diet to full liquids.  We will continue with DVT prophylaxis we will continue with pain management.  Local care given.  I encouraged the patient to ambulate.  Arnold Long, MD

## 2020-07-02 MED ORDER — BOOST / RESOURCE BREEZE PO LIQD CUSTOM
1.0000 | Freq: Three times a day (TID) | ORAL | Status: DC
  Administered 2020-07-02 (×3): 1 via ORAL

## 2020-07-02 NOTE — Progress Notes (Signed)
[place] Hospital Day(s): 4.   Post op day(s): 4 Days Post-Op.   Interval History: Patient seen and examined, no acute events or new complaints overnight. Patient reports having intermittent severe pain on the right lower quadrant. Reports it improved after passing gas and having bowel movement during the night. Denies nausea or vomiting. Report urinating a lot, clear without any pain or difficulty.   Vital signs in last 24 hours: [min-max] current  Temp:  [98.1 F (36.7 C)-98.9 F (37.2 C)] 98.9 F (37.2 C) (11/13 0412) Pulse Rate:  [83-85] 83 (11/13 0412) Resp:  [18-20] 18 (11/13 0412) BP: (144-145)/(83-88) 144/83 (11/13 0412) SpO2:  [98 %] 98 % (11/12 1910)     Height: 6' (182.9 cm) Weight: 120.2 kg BMI (Calculated): 35.93   Physical Exam:  Constitutional: alert, cooperative and no distress  Respiratory: breathing non-labored at rest  Cardiovascular: regular rate and sinus rhythm  Gastrointestinal: soft, moderate-tender in right lower quadrant, and non-distended  Labs:  CBC Latest Ref Rng & Units 06/30/2020 06/29/2020 11/29/2019  WBC 4.0 - 10.5 K/uL 14.5(H) 15.8(H) 17.9(H)  Hemoglobin 13.0 - 17.0 g/dL 10.2(L) 10.6(L) 13.3  Hematocrit 39 - 52 % 29.4(L) 30.4(L) 40.1  Platelets 150 - 400 K/uL 218 220 408(H)   CMP Latest Ref Rng & Units 06/30/2020 06/29/2020 11/29/2019  Glucose 70 - 99 mg/dL 112(H) 135(H) 100(H)  BUN 6 - 20 mg/dL 11 7 12   Creatinine 0.61 - 1.24 mg/dL 0.70 0.64 0.66  Sodium 135 - 145 mmol/L 136 138 134(L)  Potassium 3.5 - 5.1 mmol/L 3.6 3.9 3.8  Chloride 98 - 111 mmol/L 104 106 104  CO2 22 - 32 mmol/L 23 24 23   Calcium 8.9 - 10.3 mg/dL 8.4(L) 8.6(L) 8.0(L)  Total Protein 6.5 - 8.1 g/dL - - -  Total Bilirubin 0.3 - 1.2 mg/dL - - -  Alkaline Phos 38 - 126 U/L - - -  AST 15 - 41 U/L - - -  ALT 0 - 44 U/L - - -    Imaging studies: No new pertinent imaging studies   Assessment/Plan:  29 y.o.malewith colostomy status3 Day Post-Ops/p  colostomy takedown, complicated by pertinent comorbidities includinggout.  Patient recovering slowly. Tolerated soft diet. Still with significant pain in need of IV pain medication that cannot be controlled with just oral pain medication. Adequate urine output. Ambulated better. Encourage to continue ambulating and staying out of bed as possible. Will continue DVT prophylaxis.   Arnold Long, MD

## 2020-07-02 NOTE — Progress Notes (Signed)
Pt. has been refusing Lovenox shot. Pt was educated on the importance of the medication but continues to to refuse. MD made aware.

## 2020-07-03 LAB — BASIC METABOLIC PANEL
Anion gap: 10 (ref 5–15)
BUN: 7 mg/dL (ref 6–20)
CO2: 22 mmol/L (ref 22–32)
Calcium: 8.1 mg/dL — ABNORMAL LOW (ref 8.9–10.3)
Chloride: 105 mmol/L (ref 98–111)
Creatinine, Ser: 0.66 mg/dL (ref 0.61–1.24)
GFR, Estimated: >60 mL/min (ref >60)
Glucose, Bld: 106 mg/dL — ABNORMAL HIGH (ref 70–99)
Potassium: 3.2 mmol/L — ABNORMAL LOW (ref 3.5–5.1)
Sodium: 137 mmol/L (ref 135–145)

## 2020-07-03 LAB — CBC
HCT: 28.7 % — ABNORMAL LOW (ref 39.0–52.0)
Hemoglobin: 10 g/dL — ABNORMAL LOW (ref 13.0–17.0)
MCH: 33.3 pg (ref 26.0–34.0)
MCHC: 34.8 g/dL (ref 30.0–36.0)
MCV: 95.7 fL (ref 80.0–100.0)
Platelets: 237 10*3/uL (ref 150–400)
RBC: 3 MIL/uL — ABNORMAL LOW (ref 4.22–5.81)
RDW: 13.1 % (ref 11.5–15.5)
WBC: 13.1 10*3/uL — ABNORMAL HIGH (ref 4.0–10.5)
nRBC: 0 % (ref 0.0–0.2)

## 2020-07-03 LAB — PHOSPHORUS: Phosphorus: 3.2 mg/dL (ref 2.5–4.6)

## 2020-07-03 LAB — MAGNESIUM: Magnesium: 1.8 mg/dL (ref 1.7–2.4)

## 2020-07-03 MED ORDER — HYDROCODONE-ACETAMINOPHEN 5-325 MG PO TABS: 1.0000 | ORAL_TABLET | ORAL | 0 refills | Status: DC | PRN

## 2020-07-03 MED ORDER — ONDANSETRON 4 MG PO TBDP: 4.0000 mg | ORAL_TABLET | Freq: Four times a day (QID) | ORAL | 0 refills | Status: DC | PRN

## 2020-07-03 NOTE — Discharge Summary (Signed)
Patient ID: ELAZAR ARGABRIGHT MRN: [id number] DOB/AGE: 26-Nov-1990 29 y.o.  Admit date: 06/28/2020 Discharge date: 07/03/2020   Discharge Diagnoses:  Active Problems:   Colostomy status ([place])   Procedures: Attempted robotic assisted laparoscopic colostomy takedown                       Open colostomy takedown                       Small bowel resection                       Mobilization of splenic flexure                       Parastomal hernia repair  Hospital Course: Patient admitted for reversal of colostomy.  He tolerated the procedure well.  He has been recovering slowly.  Patient has been slowly improving with the pain.  He has not had any fever during the admission.  He had some tachycardia in the first 2 days after surgery that was improved with pain control.  He has some issue with pain control in the first few days after surgery.  He has not used any pain medication in the last 2 days.  He has been passing gas and bowel movement.  He tolerated soft diet.  This morning the patient had 1 pint and a bacon.  He has been drinking plenty of water and boost supplements.  He is wounds are dry and clean.  Left abdominal wall has been changed with wet-to-dry packings.  The drain that is located on the pelvis is draining serous fluid and slowly decreasing in amount.  We will keep drain in place and will be removed as outpatient.  Wounds are dry and clean.  No sign of infection at this moment.  Physical Exam Cardiovascular:     Rate and Rhythm: Normal rate and regular rhythm.     Pulses: Normal pulses.  Pulmonary:     Effort: Pulmonary effort is normal.     Breath sounds: Normal breath sounds.  Abdominal:     General: Abdomen is flat. Bowel sounds are normal. There is no distension.     Palpations: Abdomen is soft.     Tenderness: There is no abdominal tenderness.  Neurological:     Mental Status: He is alert and oriented to person, place, and time.   Wound are dry and clean.     Consults: None  Disposition: Discharge disposition: 01-Home or Self Care        Allergies as of 07/03/2020      Reactions   Amoxicillin Rash      Medication List    TAKE these medications   allopurinol 100 MG tablet Commonly known as: Zyloprim Take 1 tablet (100 mg total) by mouth daily. What changed:   how much to take  when to take this   amLODipine-valsartan 5-160 MG tablet Commonly known as: EXFORGE Take 1 tablet by mouth every morning.   carvedilol 12.5 MG tablet Commonly known as: COREG Take 12.5 mg by mouth 2 (two) times daily with a meal.   colchicine 0.6 MG tablet Take 1 tablet (0.6 mg total) by mouth daily.   ELDERBERRY PO Take 1 tablet by mouth daily. Vitamin C-D-Zinc   HYDROcodone-acetaminophen 5-325 MG tablet Commonly known as: NORCO/VICODIN Take 1 tablet by mouth every 4 (four) hours as needed for moderate pain.  ibuprofen 200 MG tablet Commonly known as: ADVIL Take 600 mg by mouth daily as needed for headache or moderate pain.   loratadine 10 MG tablet Commonly known as: CLARITIN Take 10 mg by mouth daily as needed for allergies.   metroNIDAZOLE 500 MG tablet Commonly known as: FLAGYL Take 500 mg by mouth once. Take at 1400, 1500 and 2200 before procedure   neomycin 500 MG tablet Commonly known as: MYCIFRADIN Take 1,000 mg by mouth once. 1400, 1500, 2200 before procedure   ondansetron 4 MG disintegrating tablet Commonly known as: ZOFRAN-ODT Take 1 tablet (4 mg total) by mouth every 6 (six) hours as needed for nausea.   spironolactone 25 MG tablet Commonly known as: ALDACTONE Take 25 mg by mouth daily.       Follow-up Information    Herbert Pun, MD Follow up on 07/08/2020.   Specialty: General Surgery Contact information: [address] [place]  [address] [phone]

## 2020-07-03 NOTE — Progress Notes (Signed)
Craig [name]  A and O x 4 VSS. Pt tolerating diet well. No complaints of pain or nausea. IV removed intact, prescriptions given. Pt voices understanding of discharge instructions with no further questions. Pt discharged home via wheelchair with wife.   Allergies as of 07/03/2020      Reactions   Amoxicillin Rash      Medication List    TAKE these medications   allopurinol 100 MG tablet Commonly known as: Zyloprim Take 1 tablet (100 mg total) by mouth daily. What changed:   how much to take  when to take this   amLODipine-valsartan 5-160 MG tablet Commonly known as: EXFORGE Take 1 tablet by mouth every morning.   carvedilol 12.5 MG tablet Commonly known as: COREG Take 12.5 mg by mouth 2 (two) times daily with a meal.   colchicine 0.6 MG tablet Take 1 tablet (0.6 mg total) by mouth daily.   ELDERBERRY PO Take 1 tablet by mouth daily. Vitamin C-D-Zinc   HYDROcodone-acetaminophen 5-325 MG tablet Commonly known as: NORCO/VICODIN Take 1 tablet by mouth every 4 (four) hours as needed for moderate pain.   ibuprofen 200 MG tablet Commonly known as: ADVIL Take 600 mg by mouth daily as needed for headache or moderate pain.   loratadine 10 MG tablet Commonly known as: CLARITIN Take 10 mg by mouth daily as needed for allergies.   metroNIDAZOLE 500 MG tablet Commonly known as: FLAGYL Take 500 mg by mouth once. Take at 1400, 1500 and 2200 before procedure   neomycin 500 MG tablet Commonly known as: MYCIFRADIN Take 1,000 mg by mouth once. 1400, 1500, 2200 before procedure   ondansetron 4 MG disintegrating tablet Commonly known as: ZOFRAN-ODT Take 1 tablet (4 mg total) by mouth every 6 (six) hours as needed for nausea.   spironolactone 25 MG tablet Commonly known as: ALDACTONE Take 25 mg by mouth daily.       Vitals:   07/02/20 2023 07/02/20 2318  BP: (!) 152/85 (!) 150/75  Pulse: 84 89  Resp: 16 16  Temp: 98.4 F (36.9 C) 98.4 F (36.9 C)  SpO2: 97%  100%    Craig [name] Craig [name]

## 2020-07-03 NOTE — Discharge Instructions (Signed)
  Diet: Resume home heart healthy regular diet.   Activity: No heavy lifting >20 pounds (children, pets, laundry, garbage) or strenuous activity until follow-up, but light activity and walking are encouraged. Do not drive or drink alcohol if taking narcotic pain medications.  Wound care: May shower with soapy water and pat dry (do not rub incisions), but no baths or submerging incision underwater until follow-up. (no swimming)   Apply wet to dry packing on the left abdomen wound.   Medications: Resume all home medications. For mild to moderate pain: acetaminophen (Tylenol) or ibuprofen (if no kidney disease). Combining Tylenol with alcohol can substantially increase your risk of causing liver disease. Narcotic pain medications, if prescribed, can be used for severe pain, though may cause nausea, constipation, and drowsiness. Do not combine Tylenol and Norco within a 6 hour period as Norco contains Tylenol. If you do not need the narcotic pain medication, you do not need to fill the prescription.  Call office [phone]) at any time if any questions, worsening pain, fevers/chills, bleeding, drainage from incision site, or other concerns.

## 2020-07-08 ENCOUNTER — Inpatient Hospital Stay: Payer: No Typology Code available for payment source | Admitting: Anesthesiology

## 2020-07-08 ENCOUNTER — Other Ambulatory Visit: Payer: Self-pay

## 2020-07-08 ENCOUNTER — Inpatient Hospital Stay
Admission: EM | Admit: 2020-07-08 | Discharge: 2020-07-14 | DRG: 329 | Disposition: A | Discharge status: Home / Self Care | Payer: No Typology Code available for payment source | Source: Ambulatory Visit | Attending: Surgery | Admitting: Surgery

## 2020-07-08 ENCOUNTER — Encounter
Admission: EM | Disposition: A | Discharge status: Home / Self Care | Payer: Self-pay | Source: Ambulatory Visit | Attending: Surgery

## 2020-07-08 ENCOUNTER — Encounter: Payer: Self-pay | Admitting: Emergency Medicine

## 2020-07-08 ENCOUNTER — Emergency Department: Payer: No Typology Code available for payment source

## 2020-07-08 DIAGNOSIS — Z20822 Contact with and (suspected) exposure to covid-19: Secondary | ICD-10-CM | POA: Diagnosis present

## 2020-07-08 DIAGNOSIS — I10 Essential (primary) hypertension: Secondary | ICD-10-CM | POA: Diagnosis present

## 2020-07-08 DIAGNOSIS — A419 Sepsis, unspecified organism: Secondary | ICD-10-CM | POA: Diagnosis present

## 2020-07-08 DIAGNOSIS — Z9049 Acquired absence of other specified parts of digestive tract: Secondary | ICD-10-CM

## 2020-07-08 DIAGNOSIS — R6521 Severe sepsis with septic shock: Secondary | ICD-10-CM | POA: Diagnosis present

## 2020-07-08 DIAGNOSIS — M109 Gout, unspecified: Secondary | ICD-10-CM | POA: Diagnosis present

## 2020-07-08 DIAGNOSIS — F17220 Nicotine dependence, chewing tobacco, uncomplicated: Secondary | ICD-10-CM | POA: Diagnosis present

## 2020-07-08 DIAGNOSIS — K760 Fatty (change of) liver, not elsewhere classified: Secondary | ICD-10-CM | POA: Diagnosis present

## 2020-07-08 DIAGNOSIS — Z881 Allergy status to other antibiotic agents status: Secondary | ICD-10-CM | POA: Diagnosis not present

## 2020-07-08 DIAGNOSIS — K9189 Other postprocedural complications and disorders of digestive system: Secondary | ICD-10-CM | POA: Diagnosis not present

## 2020-07-08 DIAGNOSIS — K650 Generalized (acute) peritonitis: Secondary | ICD-10-CM | POA: Diagnosis not present

## 2020-07-08 DIAGNOSIS — R059 Cough, unspecified: Secondary | ICD-10-CM

## 2020-07-08 DIAGNOSIS — B999 Unspecified infectious disease: Secondary | ICD-10-CM

## 2020-07-08 DIAGNOSIS — N179 Acute kidney failure, unspecified: Secondary | ICD-10-CM | POA: Diagnosis present

## 2020-07-08 DIAGNOSIS — Z79899 Other long term (current) drug therapy: Secondary | ICD-10-CM | POA: Diagnosis not present

## 2020-07-08 DIAGNOSIS — Y832 Surgical operation with anastomosis, bypass or graft as the cause of abnormal reaction of the patient, or of later complication, without mention of misadventure at the time of the procedure: Secondary | ICD-10-CM | POA: Diagnosis present

## 2020-07-08 DIAGNOSIS — K668 Other specified disorders of peritoneum: Secondary | ICD-10-CM | POA: Diagnosis present

## 2020-07-08 HISTORY — PX: LAPAROTOMY: SHX154

## 2020-07-08 LAB — CBC
HCT: 36.1 % — ABNORMAL LOW (ref 39.0–52.0)
Hemoglobin: 12 g/dL — ABNORMAL LOW (ref 13.0–17.0)
MCH: 32.4 pg (ref 26.0–34.0)
MCHC: 33.2 g/dL (ref 30.0–36.0)
MCV: 97.6 fL (ref 80.0–100.0)
Platelets: 399 10*3/uL (ref 150–400)
RBC: 3.7 MIL/uL — ABNORMAL LOW (ref 4.22–5.81)
RDW: 13.7 % (ref 11.5–15.5)
WBC: 41.9 10*3/uL — ABNORMAL HIGH (ref 4.0–10.5)
nRBC: 0.2 % (ref 0.0–0.2)

## 2020-07-08 LAB — TYPE AND SCREEN
ABO/RH(D): O POS
Antibody Screen: NEGATIVE

## 2020-07-08 LAB — MRSA PCR SCREENING: MRSA by PCR: NEGATIVE

## 2020-07-08 LAB — COMPREHENSIVE METABOLIC PANEL
ALT: 27 U/L (ref 0–44)
AST: 29 U/L (ref 15–41)
Albumin: 3.1 g/dL — ABNORMAL LOW (ref 3.5–5.0)
Alkaline Phosphatase: 53 U/L (ref 38–126)
Anion gap: 14 (ref 5–15)
BUN: 23 mg/dL — ABNORMAL HIGH (ref 6–20)
CO2: 24 mmol/L (ref 22–32)
Calcium: 8.5 mg/dL — ABNORMAL LOW (ref 8.9–10.3)
Chloride: 98 mmol/L (ref 98–111)
Creatinine, Ser: 2.25 mg/dL — ABNORMAL HIGH (ref 0.61–1.24)
GFR, Estimated: 39 mL/min — ABNORMAL LOW (ref >60)
Glucose, Bld: 129 mg/dL — ABNORMAL HIGH (ref 70–99)
Potassium: 3.7 mmol/L (ref 3.5–5.1)
Sodium: 136 mmol/L (ref 135–145)
Total Bilirubin: 1 mg/dL (ref 0.3–1.2)
Total Protein: 5.8 g/dL — ABNORMAL LOW (ref 6.5–8.1)

## 2020-07-08 LAB — RESP PANEL BY RT-PCR (FLU A&B, COVID) ARPGX2
Influenza A by PCR: NEGATIVE
Influenza B by PCR: NEGATIVE
SARS Coronavirus 2 by RT PCR: NEGATIVE

## 2020-07-08 LAB — GLUCOSE, CAPILLARY: Glucose-Capillary: 134 mg/dL — ABNORMAL HIGH (ref 70–99)

## 2020-07-08 LAB — LIPASE, BLOOD: Lipase: 22 U/L (ref 11–51)

## 2020-07-08 LAB — PROCALCITONIN: Procalcitonin: 23.98 ng/mL

## 2020-07-08 LAB — LACTIC ACID, PLASMA
Lactic Acid, Venous: 3.2 mmol/L (ref 0.5–1.9)
Lactic Acid, Venous: 3.2 mmol/L (ref 0.5–1.9)
Lactic Acid, Venous: 2.9 mmol/L (ref 0.5–1.9)
Lactic Acid, Venous: 2.6 mmol/L (ref 0.5–1.9)

## 2020-07-08 IMAGING — CT CT ABD-PELV W/O CM
2 of 5 series · 15 of 46 positions shown, 17 images · non-contrast
Comparison: [DATE]

CLINICAL DATA: 29-year-old male with history of recent colostomy
takedown presenting with fever and abdominal pain.

EXAM:
CT ABDOMEN AND PELVIS WITHOUT CONTRAST
TECHNIQUE: Multidetector CT imaging of the abdomen and pelvis was performed
following the standard protocol without IV contrast.

[Series 2: routine abd/pel wo · axial · 0.79mm/px · z∈[-894,-399]mm · 12 of 118 slices shown, 14 images]
[im 10/118  soft-tissue]
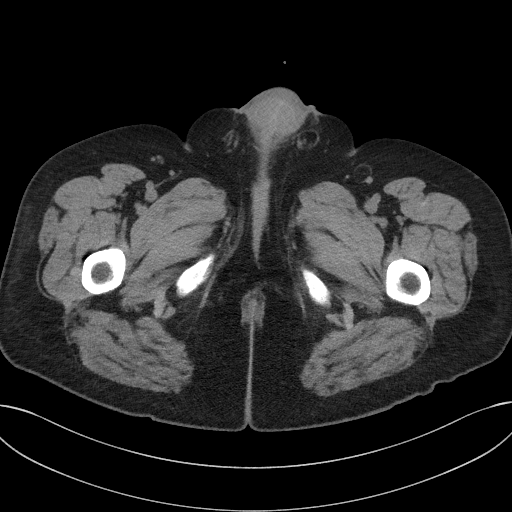
[im 10/118  bone]
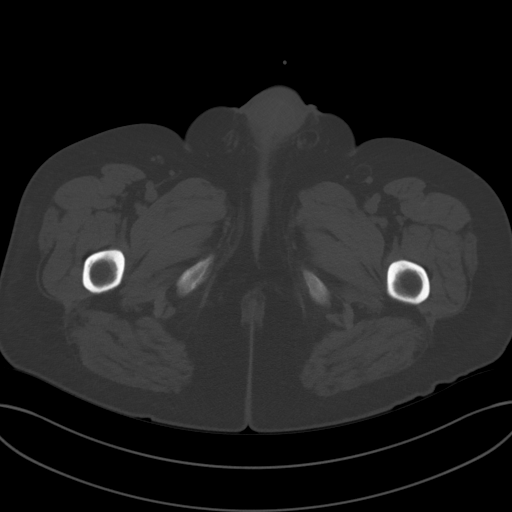
[im 19/118  soft-tissue]
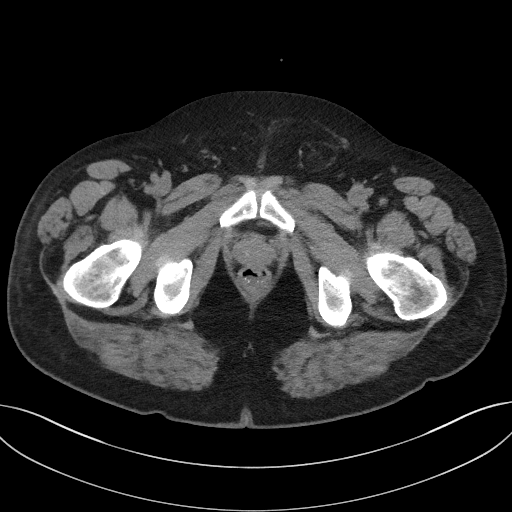
[im 28/118  soft-tissue]
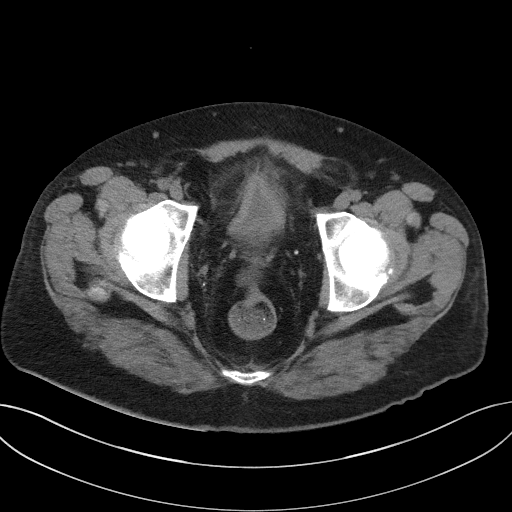
[im 37/118  soft-tissue]
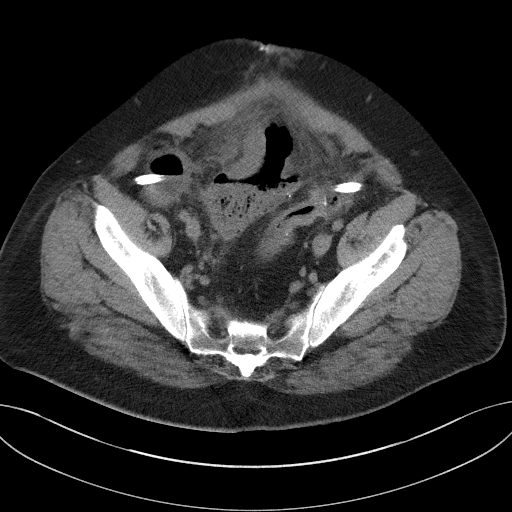
[im 46/118  soft-tissue]
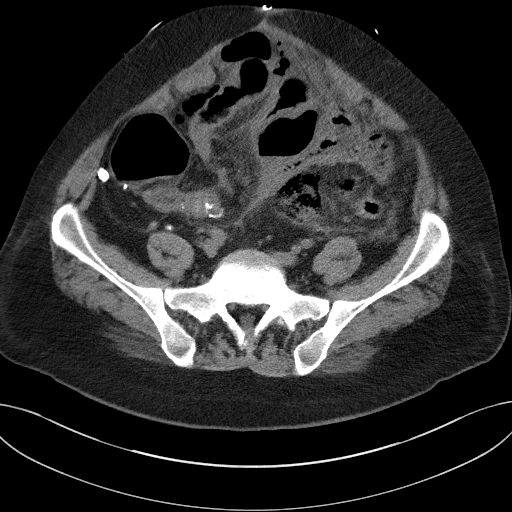
[im 55/118  soft-tissue]
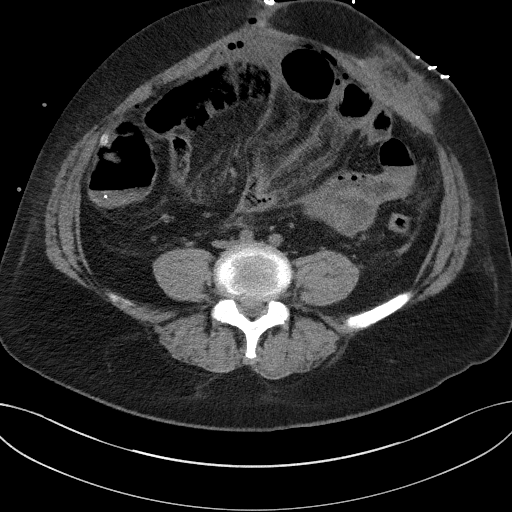
[im 64/118  soft-tissue]
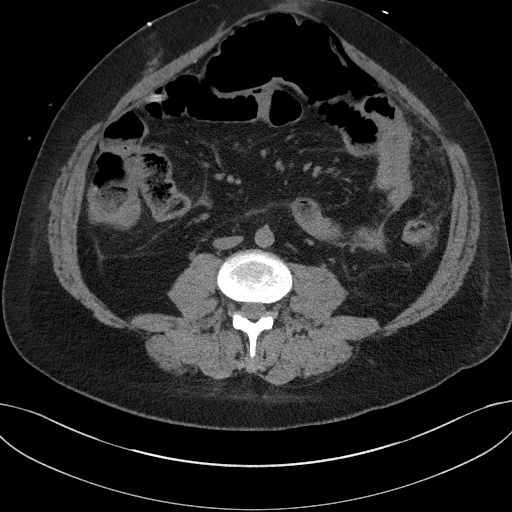
[im 73/118  soft-tissue]
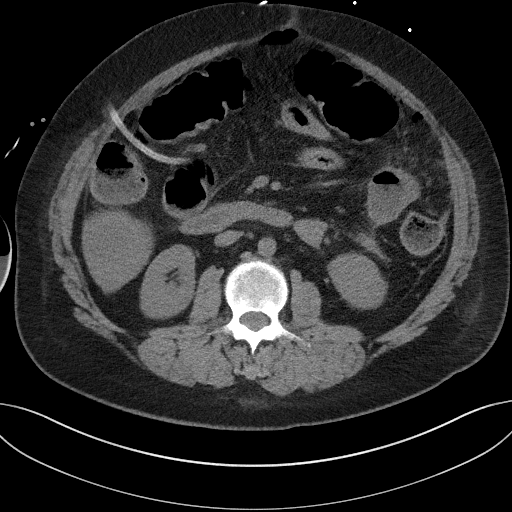
[im 82/118  soft-tissue]
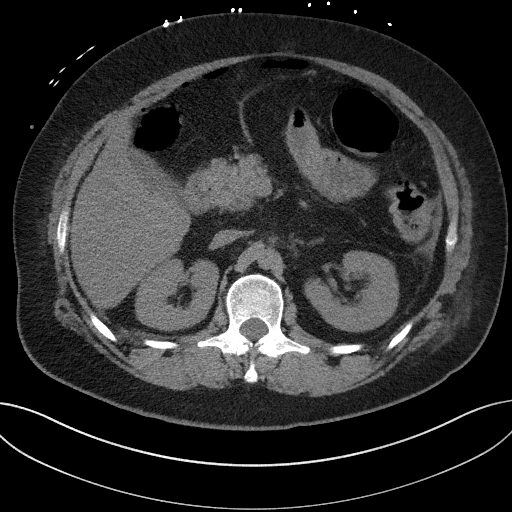
[im 82/118  bone]
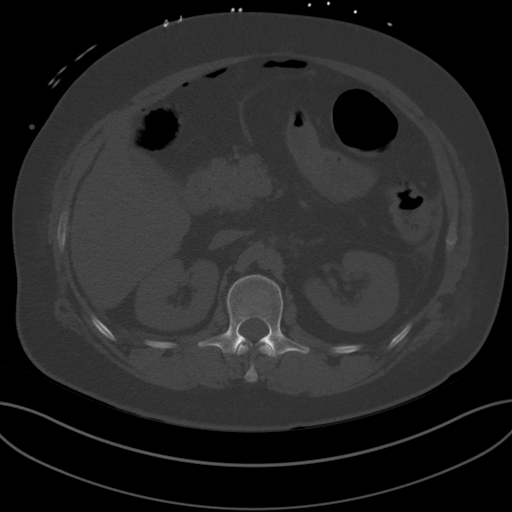
[im 91/118  soft-tissue]
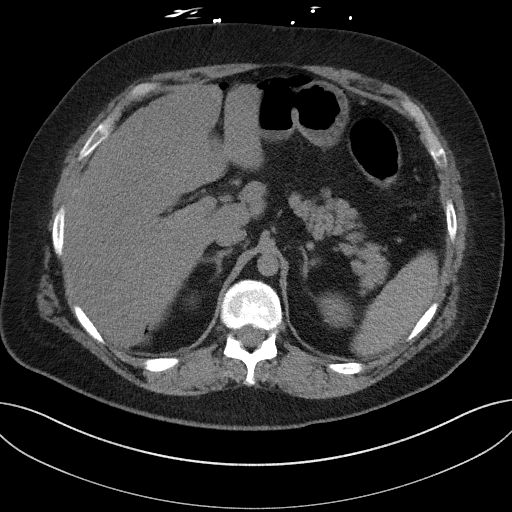
[im 100/118  soft-tissue]
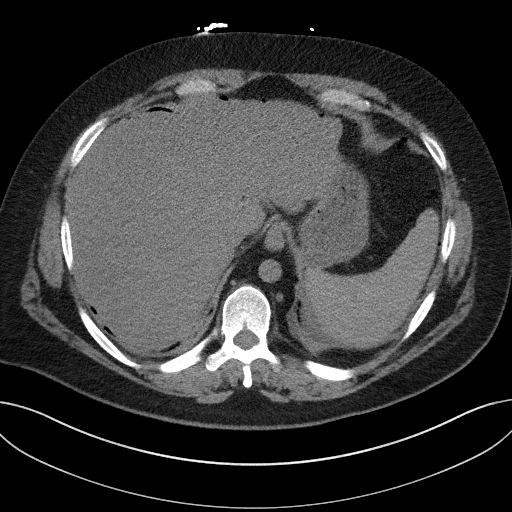
[im 109/118  soft-tissue]
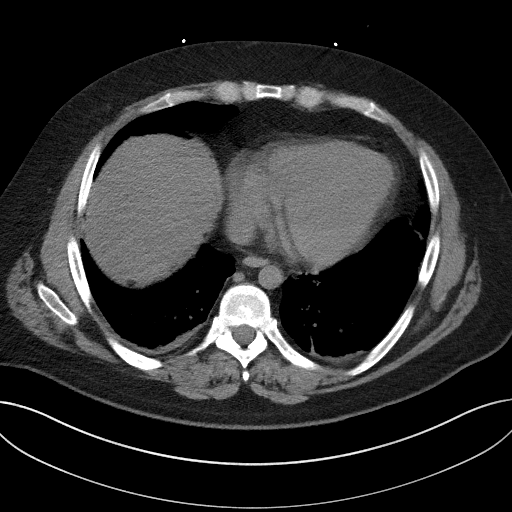

[Series 6: coronal st · coronal · 0.95mm/px · 3 of 120 slices shown]
[im 40/120  soft-tissue]
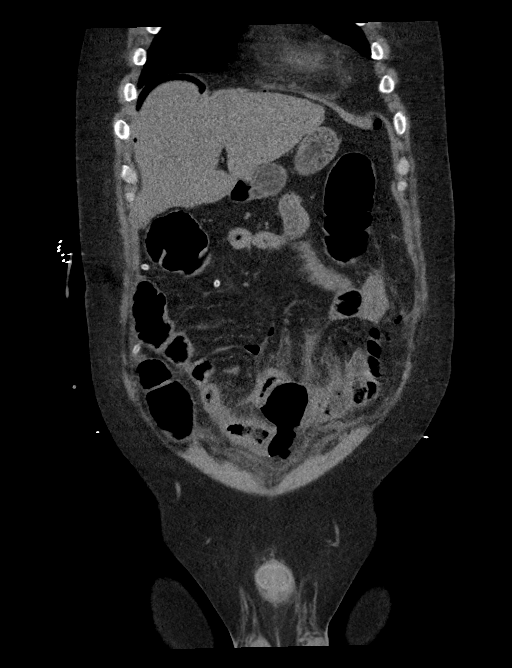
[im 53/120  soft-tissue]
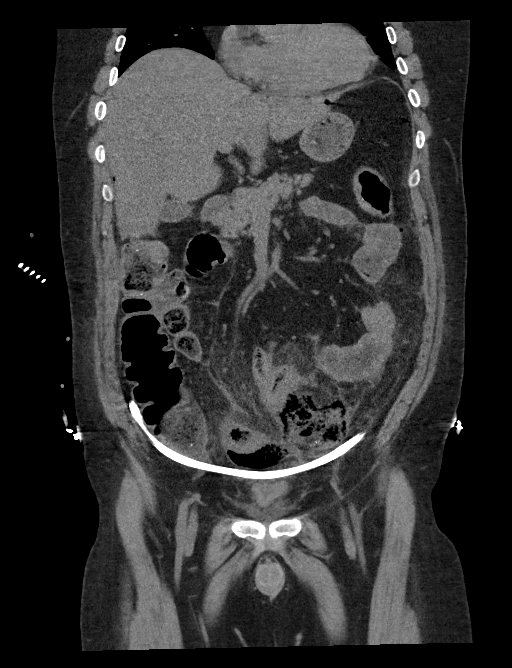
[im 67/120  soft-tissue]
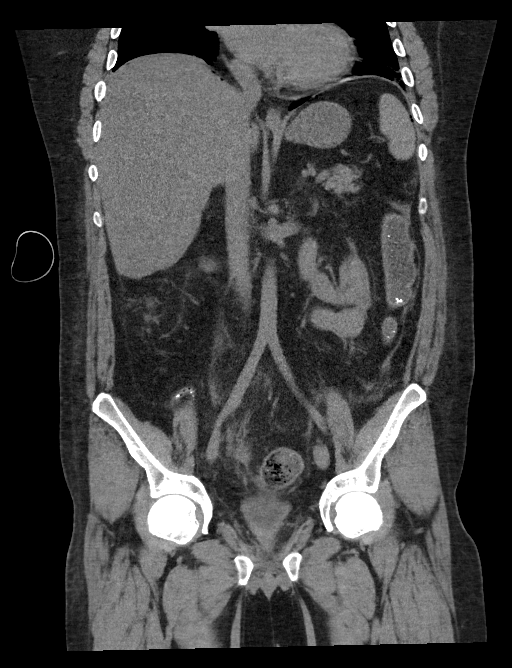

[15 of 46 positions shown; findings below may reference images not displayed]

FINDINGS: Lower chest: Mild bibasilar subsegmental atelectasis. The heart is
normal in size. No pericardial effusion.

Hepatobiliary: The liver is normal in size and contour. There is
diffusely decreased attenuation. No focal lesions identified. The
gallbladder is present and unremarkable. No intra or extrahepatic
biliary ductal dilation.

Pancreas: Unremarkable. No pancreatic ductal dilatation or
surrounding inflammatory changes.

Spleen: Normal in size without focal abnormality.

Adrenals/Urinary Tract: Adrenal glands are unremarkable. Kidneys are
normal, without renal calculi, focal lesion, or hydronephrosis.
Bladder is unremarkable.

Stomach/Bowel: Postsurgical changes after LABELLE SALHA and sigmoid
colo-colostomy. There is apparent dehiscence of the sigmoid
colo-colostomy with free spillage of bowel contents into the
peritoneum. Scattered minimally dilated loops of small bowel. Mild
dilation of the ascending and transverse colon. The appendix is not
definitively visualized, likely surgically absent. The stomach
appears normal.

Vascular/Lymphatic: No significant vascular findings are present. No
enlarged abdominal or pelvic lymph nodes.

Reproductive: Prostate is unremarkable.

Other: Scattered small volume pneumoperitoneum. Feculent,
ill-defined irregularly shaped pelvic and mid abdominal fluid and
gas collection just medial to the dehisced colo-colostomy suture
line extending medially and anteriorly to near the level of the
umbilicus. Scattered surrounding mesenteric fat stranding. LABELLE SALHA
LABELLE SALHA in place entering the abdomen in the right upper quadrant,
terminating in the left lower quadrant.

Musculoskeletal: No acute or significant osseous findings.
IMPRESSION: 1. Dehisced sigmoid colo-colostomy with free spillage of bowel
contents into the lower abdomen and associated scattered small
volume pneumoperitoneum.
2. Hepatic steatosis.

These results were discussed in person at the time of interpretation
on [DATE] at [DATE] to provider Dr. LABELLE SALHA, Who
verbally acknowledged these results.

## 2020-07-08 SURGERY — LAPAROTOMY, EXPLORATORY
Anesthesia: General

## 2020-07-08 MED ORDER — ACETAMINOPHEN 650 MG RE SUPP: 650.0000 mg | Freq: Four times a day (QID) | RECTAL | Status: DC | PRN

## 2020-07-08 MED ORDER — METRONIDAZOLE 500 MG PO TABS: 500.0000 mg | ORAL_TABLET | Freq: Once | ORAL | Status: DC

## 2020-07-08 MED ORDER — PHENYLEPHRINE HCL-NACL 10-0.9 MG/250ML-% IV SOLN
0.0000 ug/min | INTRAVENOUS | Status: DC
  Administered 2020-07-08: 15 ug/min via INTRAVENOUS
  Administered 2020-07-08: 20 ug/min via INTRAVENOUS
  Filled 2020-07-08 (×2): qty 250

## 2020-07-08 MED ORDER — CIPROFLOXACIN IN D5W 400 MG/200ML IV SOLN
400.0000 mg | Freq: Two times a day (BID) | INTRAVENOUS | Status: DC
  Administered 2020-07-08: 400 mg via INTRAVENOUS

## 2020-07-08 MED ORDER — HYDROCODONE-ACETAMINOPHEN 5-325 MG PO TABS: 1.0000 | ORAL_TABLET | ORAL | Status: DC | PRN

## 2020-07-08 MED ORDER — GENTAMICIN SULFATE 40 MG/ML IJ SOLN
7.0000 mg/kg | INTRAVENOUS | Status: DC
  Administered 2020-07-08: 660 mg via INTRAVENOUS
  Filled 2020-07-08 (×3): qty 16.5

## 2020-07-08 MED ORDER — SODIUM CHLORIDE 0.9 % IV SOLN
INTRAVENOUS | Status: DC | PRN
  Administered 2020-07-08: 50 ug/min via INTRAVENOUS

## 2020-07-08 MED ORDER — ACETAMINOPHEN 10 MG/ML IV SOLN
INTRAVENOUS | Status: DC | PRN
  Administered 2020-07-08: 1000 mg via INTRAVENOUS

## 2020-07-08 MED ORDER — ONDANSETRON HCL 4 MG/2ML IJ SOLN: 4.0000 mg | Freq: Once | INTRAMUSCULAR | Status: DC | PRN

## 2020-07-08 MED ORDER — MORPHINE SULFATE (PF) 4 MG/ML IV SOLN
4.0000 mg | Freq: Once | INTRAVENOUS | Status: AC
  Administered 2020-07-08: 4 mg via INTRAVENOUS
  Filled 2020-07-08: qty 1

## 2020-07-08 MED ORDER — CARVEDILOL 12.5 MG PO TABS: 12.5000 mg | ORAL_TABLET | Freq: Two times a day (BID) | ORAL | Status: DC

## 2020-07-08 MED ORDER — ACETAMINOPHEN 10 MG/ML IV SOLN
INTRAVENOUS | Status: AC
  Filled 2020-07-08: qty 100

## 2020-07-08 MED ORDER — DEXMEDETOMIDINE (PRECEDEX) IN NS 20 MCG/5ML (4 MCG/ML) IV SYRINGE
PREFILLED_SYRINGE | INTRAVENOUS | Status: DC | PRN
  Administered 2020-07-08: 12 ug via INTRAVENOUS
  Administered 2020-07-08: 8 ug via INTRAVENOUS

## 2020-07-08 MED ORDER — SODIUM CHLORIDE 0.9 % IV BOLUS
1000.0000 mL | Freq: Once | INTRAVENOUS | Status: AC
  Administered 2020-07-08: 1000 mL via INTRAVENOUS

## 2020-07-08 MED ORDER — SUGAMMADEX SODIUM 500 MG/5ML IV SOLN
INTRAVENOUS | Status: DC | PRN
  Administered 2020-07-08: 300 mg via INTRAVENOUS

## 2020-07-08 MED ORDER — FENTANYL CITRATE (PF) 100 MCG/2ML IJ SOLN: 25.0000 ug | INTRAMUSCULAR | Status: DC | PRN

## 2020-07-08 MED ORDER — VASOPRESSIN 20 UNIT/ML IV SOLN
INTRAVENOUS | Status: DC | PRN
  Administered 2020-07-08: 2 [IU] via INTRAVENOUS
  Administered 2020-07-08: 1 [IU] via INTRAVENOUS
  Administered 2020-07-08 (×2): 2 [IU] via INTRAVENOUS

## 2020-07-08 MED ORDER — SODIUM CHLORIDE 0.9 % IV SOLN: INTRAVENOUS | Status: DC | PRN

## 2020-07-08 MED ORDER — ONDANSETRON HCL 4 MG/2ML IJ SOLN: 4.0000 mg | Freq: Four times a day (QID) | INTRAMUSCULAR | Status: DC | PRN

## 2020-07-08 MED ORDER — SUCCINYLCHOLINE CHLORIDE 20 MG/ML IJ SOLN
INTRAMUSCULAR | Status: DC | PRN
  Administered 2020-07-08: 140 mg via INTRAVENOUS

## 2020-07-08 MED ORDER — PANTOPRAZOLE SODIUM 40 MG IV SOLR
40.0000 mg | Freq: Every day | INTRAVENOUS | Status: DC
  Administered 2020-07-08 – 2020-07-10 (×3): 40 mg via INTRAVENOUS
  Filled 2020-07-08 (×3): qty 40

## 2020-07-08 MED ORDER — NEOMYCIN SULFATE 500 MG PO TABS: 1000.0000 mg | ORAL_TABLET | Freq: Once | ORAL | Status: DC

## 2020-07-08 MED ORDER — FENTANYL CITRATE (PF) 100 MCG/2ML IJ SOLN
INTRAMUSCULAR | Status: AC
  Filled 2020-07-08: qty 2

## 2020-07-08 MED ORDER — SODIUM CHLORIDE 0.9 % IV SOLN: INTRAVENOUS | Status: DC

## 2020-07-08 MED ORDER — METRONIDAZOLE IN NACL 5-0.79 MG/ML-% IV SOLN: 500.0000 mg | Freq: Three times a day (TID) | INTRAVENOUS | Status: DC

## 2020-07-08 MED ORDER — SODIUM CHLORIDE 0.9 % IV SOLN
1.0000 g | Freq: Three times a day (TID) | INTRAVENOUS | Status: DC
  Administered 2020-07-08 – 2020-07-14 (×17): 1 g via INTRAVENOUS
  Filled 2020-07-08 (×20): qty 1

## 2020-07-08 MED ORDER — LIDOCAINE HCL (CARDIAC) PF 100 MG/5ML IV SOSY
PREFILLED_SYRINGE | INTRAVENOUS | Status: DC | PRN
  Administered 2020-07-08: 100 mg via INTRAVENOUS

## 2020-07-08 MED ORDER — FENTANYL CITRATE (PF) 100 MCG/2ML IJ SOLN
INTRAMUSCULAR | Status: DC | PRN
  Administered 2020-07-08: 25 ug via INTRAVENOUS
  Administered 2020-07-08 (×2): 50 ug via INTRAVENOUS

## 2020-07-08 MED ORDER — MORPHINE SULFATE (PF) 4 MG/ML IV SOLN
INTRAVENOUS | Status: AC
  Filled 2020-07-08: qty 1

## 2020-07-08 MED ORDER — ACETAMINOPHEN 325 MG PO TABS
650.0000 mg | ORAL_TABLET | Freq: Four times a day (QID) | ORAL | Status: DC | PRN
  Administered 2020-07-13: 650 mg via ORAL
  Filled 2020-07-08: qty 2

## 2020-07-08 MED ORDER — ENOXAPARIN SODIUM 60 MG/0.6ML ~~LOC~~ SOLN
0.5000 mg/kg | SUBCUTANEOUS | Status: DC
  Administered 2020-07-09 – 2020-07-13 (×5): 60 mg via SUBCUTANEOUS
  Filled 2020-07-08 (×7): qty 0.6

## 2020-07-08 MED ORDER — CIPROFLOXACIN IN D5W 400 MG/200ML IV SOLN
400.0000 mg | Freq: Once | INTRAVENOUS | Status: DC
  Filled 2020-07-08: qty 200

## 2020-07-08 MED ORDER — NOREPINEPHRINE BITARTRATE 1 MG/ML IV SOLN
INTRAVENOUS | Status: AC
  Filled 2020-07-08: qty 4

## 2020-07-08 MED ORDER — HYDROMORPHONE HCL 1 MG/ML IJ SOLN: 0.2500 mg | INTRAMUSCULAR | Status: DC | PRN

## 2020-07-08 MED ORDER — SODIUM CHLORIDE 0.9 % IV SOLN
INTRAVENOUS | Status: DC | PRN
  Administered 2020-07-08: 60 mL

## 2020-07-08 MED ORDER — PROPOFOL 10 MG/ML IV BOLUS
INTRAVENOUS | Status: AC
  Filled 2020-07-08: qty 20

## 2020-07-08 MED ORDER — ALLOPURINOL 300 MG PO TABS
300.0000 mg | ORAL_TABLET | ORAL | Status: DC
  Administered 2020-07-09: 300 mg via ORAL
  Filled 2020-07-08: qty 1

## 2020-07-08 MED ORDER — ROCURONIUM BROMIDE 10 MG/ML (PF) SYRINGE
PREFILLED_SYRINGE | INTRAVENOUS | Status: AC
  Filled 2020-07-08: qty 10

## 2020-07-08 MED ORDER — COLCHICINE 0.6 MG PO TABS: 0.6000 mg | ORAL_TABLET | Freq: Every day | ORAL | Status: DC

## 2020-07-08 MED ORDER — DEXAMETHASONE SODIUM PHOSPHATE 10 MG/ML IJ SOLN
INTRAMUSCULAR | Status: DC | PRN
  Administered 2020-07-08: 10 mg via INTRAVENOUS

## 2020-07-08 MED ORDER — LIDOCAINE HCL (PF) 2 % IJ SOLN
INTRAMUSCULAR | Status: AC
  Filled 2020-07-08: qty 5

## 2020-07-08 MED ORDER — MORPHINE SULFATE (PF) 4 MG/ML IV SOLN
4.0000 mg | INTRAVENOUS | Status: DC | PRN
  Administered 2020-07-08 (×2): 4 mg via INTRAVENOUS
  Administered 2020-07-09: 2 mg via INTRAVENOUS
  Administered 2020-07-09 – 2020-07-13 (×11): 4 mg via INTRAVENOUS
  Filled 2020-07-08 (×13): qty 1

## 2020-07-08 MED ORDER — SODIUM CHLORIDE 0.9 % IV BOLUS
2500.0000 mL | Freq: Once | INTRAVENOUS | Status: AC
  Administered 2020-07-08: 2500 mL via INTRAVENOUS

## 2020-07-08 MED ORDER — ONDANSETRON HCL 4 MG/2ML IJ SOLN
INTRAMUSCULAR | Status: DC | PRN
  Administered 2020-07-08: 4 mg via INTRAVENOUS

## 2020-07-08 MED ORDER — ONDANSETRON HCL 4 MG/2ML IJ SOLN
4.0000 mg | Freq: Once | INTRAMUSCULAR | Status: AC
  Administered 2020-07-08: 4 mg via INTRAVENOUS
  Filled 2020-07-08: qty 2

## 2020-07-08 MED ORDER — SUGAMMADEX SODIUM 500 MG/5ML IV SOLN
INTRAVENOUS | Status: AC
  Filled 2020-07-08: qty 5

## 2020-07-08 MED ORDER — PHENYLEPHRINE HCL (PRESSORS) 10 MG/ML IV SOLN
INTRAVENOUS | Status: DC | PRN
  Administered 2020-07-08: 200 ug via INTRAVENOUS
  Administered 2020-07-08: 100 ug via INTRAVENOUS
  Administered 2020-07-08: 200 ug via INTRAVENOUS
  Administered 2020-07-08 (×2): 100 ug via INTRAVENOUS
  Administered 2020-07-08: 200 ug via INTRAVENOUS
  Administered 2020-07-08 (×4): 100 ug via INTRAVENOUS
  Administered 2020-07-08 (×2): 200 ug via INTRAVENOUS
  Administered 2020-07-08: 100 ug via INTRAVENOUS

## 2020-07-08 MED ORDER — ROCURONIUM BROMIDE 100 MG/10ML IV SOLN
INTRAVENOUS | Status: DC | PRN
  Administered 2020-07-08: 10 mg via INTRAVENOUS
  Administered 2020-07-08: 60 mg via INTRAVENOUS
  Administered 2020-07-08: 30 mg via INTRAVENOUS

## 2020-07-08 MED ORDER — PROPOFOL 10 MG/ML IV BOLUS
INTRAVENOUS | Status: DC | PRN
  Administered 2020-07-08: 200 mg via INTRAVENOUS

## 2020-07-08 MED ORDER — BUPIVACAINE-EPINEPHRINE (PF) 0.5% -1:200000 IJ SOLN
INTRAMUSCULAR | Status: DC | PRN
  Administered 2020-07-08: 30 mL

## 2020-07-08 MED ORDER — DEXAMETHASONE SODIUM PHOSPHATE 10 MG/ML IJ SOLN
INTRAMUSCULAR | Status: AC
  Filled 2020-07-08: qty 1

## 2020-07-08 MED ORDER — ONDANSETRON 4 MG PO TBDP
4.0000 mg | ORAL_TABLET | Freq: Four times a day (QID) | ORAL | Status: DC | PRN
  Filled 2020-07-08: qty 1

## 2020-07-08 MED ORDER — GLYCOPYRROLATE 0.2 MG/ML IJ SOLN
INTRAMUSCULAR | Status: AC
  Filled 2020-07-08: qty 1

## 2020-07-08 MED ORDER — METRONIDAZOLE IN NACL 5-0.79 MG/ML-% IV SOLN
500.0000 mg | Freq: Once | INTRAVENOUS | Status: AC
  Administered 2020-07-08: 500 mg via INTRAVENOUS
  Filled 2020-07-08: qty 100

## 2020-07-08 MED ORDER — SUCCINYLCHOLINE CHLORIDE 200 MG/10ML IV SOSY
PREFILLED_SYRINGE | INTRAVENOUS | Status: AC
  Filled 2020-07-08: qty 10

## 2020-07-08 MED ORDER — CHLORHEXIDINE GLUCONATE 0.12 % MT SOLN: 15.0000 mL | Freq: Once | OROMUCOSAL | Status: DC

## 2020-07-08 SURGICAL SUPPLY — 43 items
BULB RESERV EVAC DRAIN JP 100C (MISCELLANEOUS) ×2 IMPLANT
CANISTER SUCT 1200ML W/VALVE (MISCELLANEOUS) IMPLANT
CHLORAPREP W/TINT 26 (MISCELLANEOUS) ×4 IMPLANT
COVER WAND RF STERILE (DRAPES) ×2 IMPLANT
DRAIN CHANNEL JP 19F (MISCELLANEOUS) ×2 IMPLANT
DRAPE LAPAROTOMY 100X77 ABD (DRAPES) ×2 IMPLANT
DRSG OPSITE POSTOP 4X10 (GAUZE/BANDAGES/DRESSINGS) IMPLANT
DRSG OPSITE POSTOP 4X14 (GAUZE/BANDAGES/DRESSINGS) ×2 IMPLANT
DRSG OPSITE POSTOP 4X8 (GAUZE/BANDAGES/DRESSINGS) IMPLANT
DRSG TEGADERM 4X10 (GAUZE/BANDAGES/DRESSINGS) IMPLANT
DRSG TELFA 3X8 NADH (GAUZE/BANDAGES/DRESSINGS) IMPLANT
ELECT REM PT RETURN 9FT ADLT (ELECTROSURGICAL) ×2
ELECTRODE REM PT RTRN 9FT ADLT (ELECTROSURGICAL) ×1 IMPLANT
GLOVE BIO SURGEON STRL SZ 6.5 (GLOVE) ×2 IMPLANT
GLOVE BIOGEL PI IND STRL 6.5 (GLOVE) ×1 IMPLANT
GLOVE BIOGEL PI INDICATOR 6.5 (GLOVE) ×1
GOWN STRL REUS W/ TWL LRG LVL3 (GOWN DISPOSABLE) ×2 IMPLANT
GOWN STRL REUS W/TWL LRG LVL3 (GOWN DISPOSABLE) ×2
KIT OSTOMY 2 PC DRNBL 2.25 STR (WOUND CARE) ×1 IMPLANT
KIT OSTOMY DRAINABLE 2.25 STR (WOUND CARE) ×1
KIT TURNOVER KIT A (KITS) ×2 IMPLANT
LABEL OR SOLS (LABEL) ×2 IMPLANT
MANIFOLD NEPTUNE II (INSTRUMENTS) ×2 IMPLANT
NS IRRIG 1000ML POUR BTL (IV SOLUTION) ×14 IMPLANT
PACK BASIN MAJOR ARMC (MISCELLANEOUS) ×2 IMPLANT
PACK COLON CLEAN CLOSURE (MISCELLANEOUS) IMPLANT
RELOAD PROXIMATE 75MM BLUE (ENDOMECHANICALS) ×2 IMPLANT
SPONGE LAP 18X18 RF (DISPOSABLE) ×2 IMPLANT
STAPLER CUT CVD 40MM BLUE (STAPLE) ×2 IMPLANT
STAPLER CUT RELOAD BLUE (STAPLE) ×2 IMPLANT
STAPLER PROXIMATE 75MM BLUE (STAPLE) ×2 IMPLANT
STAPLER SKIN PROX 35W (STAPLE) ×2 IMPLANT
SUT PDS AB 0 CT1 27 (SUTURE) ×4 IMPLANT
SUT PDS AB 1 TP1 54 (SUTURE) ×4 IMPLANT
SUT SILK 2 0 (SUTURE) ×1
SUT SILK 2-0 18XBRD TIE 12 (SUTURE) ×1 IMPLANT
SUT SILK 3 0 (SUTURE) ×1
SUT SILK 3-0 18XBRD TIE 12 (SUTURE) ×1 IMPLANT
SUT VIC AB 3-0 SH 27 (SUTURE) ×2
SUT VIC AB 3-0 SH 27X BRD (SUTURE) ×2 IMPLANT
SUT VIC AB 3-0 SH 8-18 (SUTURE) ×4 IMPLANT
SUT VICRYL 3-0 CR8 SH (SUTURE) ×2 IMPLANT
TRAY FOLEY MTR SLVR 16FR STAT (SET/KITS/TRAYS/PACK) ×2 IMPLANT

## 2020-07-08 NOTE — ED Provider Notes (Signed)
[facility] Emergency Department Provider Note  Time seen: 10:24 AM  I have reviewed the triage vital signs and the nursing notes.   HISTORY  Chief Complaint Hypotension and Abdominal Pain   HPI Craig [name] is a 29 y.o. male with a past medical history of hypertension, history of diverticulitis with colon perforation in April requiring colostomy, recent colostomy takedown 1 week ago, presents to the emergency department for abdominal pain.   According to the patient 1 week ago he had a colostomy takedown, had been feeling well since until yesterday he developed abdominal pain mostly across the lower abdomen but today it seems more diffuse across the entire abdomen.  Patient states some nausea but denies any vomiting.  Had been experiencing diarrhea, last bowel movement yesterday.  Was seen in surgery's office this morning found to be hypotensive initially with blood pressures around 80 systolic.  Patient was brought to the emergency department for further evaluation.  Past Medical History:  Diagnosis Date  . Gout   . Hypertension   . Rupture of bowel [facility])     Patient Active Problem List   Diagnosis Date Noted  . Colostomy status ([place]) 06/28/2020  . Diverticulitis of colon with perforation 11/22/2019    Past Surgical History:  Procedure Laterality Date  . COLONOSCOPY WITH PROPOFOL N/A 05/18/2020   Procedure: COLONOSCOPY WITH PROPOFOL;  Surgeon: Benjamine Sprague, DO;  Location: [facility] ENDOSCOPY;  Service: General;  Laterality: N/A;  . COLOSTOMY N/A 11/22/2019   Procedure: COLOSTOMY;  Surgeon: Herbert Pun, MD;  Location: [facility] ORS;  Service: General;  Laterality: N/A;  . LAPAROTOMY N/A 11/22/2019   Procedure: EXPLORATORY LAPAROTOMY;  Surgeon: Herbert Pun, MD;  Location: [facility] ORS;  Service: General;  Laterality: N/A;  . PARTIAL COLECTOMY N/A 11/22/2019   Procedure: PARTIAL COLECTOMY;  Surgeon: Herbert Pun, MD;  Location: [facility] ORS;   Service: General;  Laterality: N/A;  . XI ROBOTIC ASSISTED COLOSTOMY TAKEDOWN N/A 06/28/2020   Procedure: XI ROBOTIC ASSISTED COLOSTOMY TAKEDOWN CONVERTED TO OPEN PROCEDURE;  Surgeon: Herbert Pun, MD;  Location: [facility] ORS;  Service: General;  Laterality: N/A;    Prior to Admission medications   Medication Sig Start Date End Date Taking? Authorizing Provider  allopurinol (ZYLOPRIM) 100 MG tablet Take 1 tablet (100 mg total) by mouth daily. Patient taking differently: Take 300 mg by mouth every morning.  12/01/19 06/28/20  Herbert Pun, MD  amLODipine-valsartan (EXFORGE) 5-160 MG tablet Take 1 tablet by mouth every morning.  05/30/20   [provider]  carvedilol (COREG) 12.5 MG tablet Take 12.5 mg by mouth 2 (two) times daily with a meal.     [provider]  colchicine 0.6 MG tablet Take 1 tablet (0.6 mg total) by mouth daily. Patient not taking: Reported on 06/02/2020 12/01/19 05/14/20  Herbert Pun, MD  ELDERBERRY PO Take 1 tablet by mouth daily. Vitamin C-D-Zinc     [provider]  HYDROcodone-acetaminophen (NORCO/VICODIN) 5-325 MG tablet Take 1 tablet by mouth every 4 (four) hours as needed for moderate pain. 07/03/20   Herbert Pun, MD  ibuprofen (ADVIL) 200 MG tablet Take 600 mg by mouth daily as needed for headache or moderate pain.     [provider]  loratadine (CLARITIN) 10 MG tablet Take 10 mg by mouth daily as needed for allergies.     [provider]  metroNIDAZOLE (FLAGYL) 500 MG tablet Take 500 mg by mouth once. Take at 1400, 1500 and 2200 before procedure 05/26/20  [provider]  neomycin (MYCIFRADIN) 500 MG tablet Take 1,000 mg by mouth once. 1400, 1500, 2200 before procedure 05/26/20   [provider]  ondansetron (ZOFRAN-ODT) 4 MG disintegrating tablet Take 1 tablet (4 mg total) by mouth every 6 (six) hours as needed for nausea. 07/03/20   Herbert Pun, MD  spironolactone  (ALDACTONE) 25 MG tablet Take 25 mg by mouth daily. Patient not taking: Reported on 06/28/2020 05/12/20   [provider]    Allergies  Allergen Reactions  . Amoxicillin Rash    Family History  Problem Relation Age of Onset  . Healthy Mother   . Healthy Father     Social History Social History   Tobacco Use  . Smoking status: Never Smoker  . Smokeless tobacco: Current User    Types: Chew  Vaping Use  . Vaping Use: Never used  Substance Use Topics  . Alcohol use: Yes    Comment: pint to a fifth per day per patient . Patient reports he hasn't had anything to drink in three weeks.  . Drug use: Never    Review of Systems Constitutional: Negative for fever. Cardiovascular: Negative for chest pain. Respiratory: Negative for shortness of breath. Gastrointestinal: Moderate dull diffuse abdominal pain.  Positive for nausea.  Negative for vomiting.  Positive for diarrhea. Genitourinary: Negative for urinary compaints Musculoskeletal: Negative for musculoskeletal complaints Neurological: Negative for headache All other ROS negative  ____________________________________________   PHYSICAL EXAM:  VITAL SIGNS: ED Triage Vitals  Enc Vitals Group     BP 07/08/20 0950 (!) 94/36     Pulse Rate 07/08/20 0950 (!) 123     Resp 07/08/20 0950 18     Temp 07/08/20 0950 98.9 F (37.2 C)     Temp Source 07/08/20 0950 Oral     SpO2 07/08/20 0950 99 %     Weight 07/08/20 0951 260 lb (117.9 kg)     Height 07/08/20 0951 6' (1.829 m)     Head Circumference --      Peak Flow --      Pain Score 07/08/20 0951 7     Pain Loc --      Pain Edu? --      Excl. in [place]? --    Constitutional: Alert and oriented. Well appearing and in no distress. Eyes: Normal exam ENT      Head: Normocephalic and atraumatic.      Mouth/Throat: Mucous membranes are moist. Cardiovascular: Normal rate, regular rhythm. Respiratory: Normal respiratory effort without tachypnea nor retractions. Breath  sounds are clear  Gastrointestinal: Soft, moderate tenderness throughout the entire abdomen more so across the lower abdomen per patient.  Mild rebound tenderness as well.  Well-appearing surgical incision with staples present across lower abdomen.  Patient has a surgical drain in place with cloudy drainage present. Musculoskeletal: Nontender with normal range of motion in all extremities.  Neurologic:  Normal speech and language. No gross focal neurologic deficits  Skin:  Skin is warm, dry and intact.  Psychiatric: Mood and affect are normal.   ____________________________________________   RADIOLOGY  CT shows signs of dehiscence and bowel contents in the abdomen.  ____________________________________________   INITIAL IMPRESSION / ASSESSMENT AND PLAN / ED COURSE  Pertinent labs & imaging results that were available during my care of the patient were reviewed by me and considered in my medical decision making (see chart for details).   Patient presents to the emergency department for abdominal pain and hypotension sent from surgical  clinic approximately 1.5 weeks status post colostomy takedown.  Dr. Peyton Najjar has seen the patient in clinic and has actually come to the emergency department to evaluate the patient in the room.  We will check labs, CT scan without oral contrast per surgery.  We will dose pain and nausea medication, IV hydrate while awaiting lab and CT imaging results.  Differential would include postoperative pain, SBO, leaking anastomosis, intra-abdominal abscess.  Lab work shows acute renal insufficiency with a white blood cell count of 41,000 and a lactate of 3.2.  Patient meets sepsis criteria, ordered broad-spectrum antibiotics to cover for intra-abdominal pathology.  With the lower blood pressure readings we will continue with IV hydration 30 mils per kilogram of fluids in total.  Surgery has been down to see the patient multiple times during his ER work-up thus far.   Awaiting CT read, Dr. Peyton Najjar is reviewing CT images as well.  CT unfortunately shows signs of dehiscence with bowel contents leaking into the abdomen.  Dr. Peyton Najjar has reviewed and is taking the patient to the operating room.  Craig [name] was evaluated in Emergency Department on 07/08/2020 for the symptoms described in the history of present illness. He was evaluated in the context of the global COVID-19 pandemic, which necessitated consideration that the patient might be at risk for infection with the SARS-CoV-2 virus that causes COVID-19. Institutional protocols and algorithms that pertain to the evaluation of patients at risk for COVID-19 are in a state of rapid change based on information released by regulatory bodies including the CDC and federal and state organizations. These policies and algorithms were followed during the patient's care in the ED.  CRITICAL CARE Performed by: [name]   Total critical care time: 45 minutes  Critical care time was exclusive of separately billable procedures and treating other patients.  Critical care was necessary to treat or prevent imminent or life-threatening deterioration.  Critical care was time spent personally by me on the following activities: development of treatment plan with patient and/or surrogate as well as nursing, discussions with consultants, evaluation of patient's response to treatment, examination of patient, obtaining history from patient or surrogate, ordering and performing treatments and interventions, ordering and review of laboratory studies, ordering and review of radiographic studies, pulse oximetry and re-evaluation of patient's condition.  ____________________________________________   FINAL CLINICAL IMPRESSION(S) / ED DIAGNOSES  Abdominal pain   [name], MD 07/08/20 1319

## 2020-07-08 NOTE — H&P (Signed)
HISTORY OF PRESENT ILLNESS:    Craig [name] is a 29 y.o.male patient who comes for follow up     He reported last night he started having weakness and he felt like he was in a pass out.  He reports that he was doing great during the whole week.  He reports lower abdominal pain.  The wife reported that he had a fever of 101 last night.  Today he comes to the office for his usual follow-up.  At the office he has a heart rate of 119 and a blood pressure of 73/48.   PAST MEDICAL HISTORY:      Past Medical History:  Diagnosis Date  . History of chickenpox   . Hypertension         PAST SURGICAL HISTORY:        Past Surgical History:  Procedure Laterality Date  . COLON SURGERY  11/2019   colostomy bag  . COLONOSCOPY THROUGH STOMA  05/18/2020   Tubular adenoma/Repeat 46yrs/[place]  . EXPLORATORY LAPAROTOMY  11/22/2019   Dr Lesli Albee  . REVISION COLOSTOMY  06/15/2020   Dr Lesli Albee --- ROBOTIC         MEDICATIONS:  Encounter Medications  Outpatient Encounter Medications as of 07/08/2020  Medication Sig Dispense Refill  . allopurinoL (ZYLOPRIM) 300 MG tablet Take 1 tablet (300 mg total) by mouth once daily 30 tablet 12  . carvediloL (COREG) 25 MG tablet Take 1 tablet (25 mg total) by mouth 2 (two) times daily with meals 180 tablet 11  . HYDROcodone-acetaminophen (NORCO) 5-325 mg tablet Take by mouth as needed    . spironolactone (ALDACTONE) 25 MG tablet Take 25 mg by mouth once daily    . [DISCONTINUED] clotrimazole-betamethasone (LOTRISONE) 1-0.05 % cream Apply topically 2 (two) times daily (Patient not taking: Reported on 07/08/2020  ) 45 g 1  . [DISCONTINUED] ibuprofen (ADVIL,MOTRIN) 200 MG tablet Take 400 mg by mouth every 6 (six) hours as needed for Pain    (Patient not taking: Reported on 07/08/2020  )    . [DISCONTINUED] neomycin 500 mg tablet Take 2 tablets at 2 pm, 3 pm and 10 pm the day before surgery. (Patient not taking: Reported on 06/21/2020  ) 6  tablet 0   No facility-administered encounter medications on file as of 07/08/2020.       ALLERGIES:   Amoxicillin   SOCIAL HISTORY:  Social History          Socioeconomic History  . Marital status: Married    Spouse name: Not on file  . Number of children: Not on file  . Years of education: Not on file  . Highest education level: Not on file  Occupational History  . Not on file  Tobacco Use  . Smoking status: Never Smoker  . Smokeless tobacco: Current User    Types: Chew  . Tobacco comment: dip  Vaping Use  . Vaping Use: Never used  Substance and Sexual Activity  . Alcohol use: Yes    Alcohol/week: 0.0 standard drinks    Comment: socially  . Drug use: Not on file  . Sexual activity: Not on file  Other Topics Concern  . Not on file  Social History Narrative  . Not on file   Social Determinants of Health   Financial Resource Strain: Not on file  Food Insecurity: Not on file  Transportation Needs: Not on file      FAMILY HISTORY:       Family History  Problem Relation Age of Onset  . No Known Problems Mother   . High blood pressure (Hypertension) Father   . Hyperlipidemia (Elevated cholesterol) Father      PHYSICAL EXAM:     Vitals:   07/08/20 0923  BP: (!) 73/48  Pulse: (!) 119  .  Ht:182.9 cm (6') Wt:(!) 122.9 kg (271 lb) VAC:QPEA surface area is 2.5 meters squared. Body mass index is 36.75 kg/m.Marland Kitchen   GENERAL: Alert, active, oriented x3  ABDOMEN: Soft and depressible, nontender with no palpable mass, no hepatomegaly. Wounds dry and clean.  Externally there is no sign of infection.  JP placed with serous fluid  EXTREMITIES: Well-developed well-nourished symmetrical with no dependent edema.  NEUROLOGICAL: Awake alert oriented, facial expression symmetrical, moving all extremities.      IMPRESSION:     S/P colostomy takedown [K35.075] -Patient had colostomy takedown on 06/28/2020.  He was discharged on 07/03/2020.  As  per patient history he was doing great at home until yesterday night that he had an episode of weakness, fever and lower abdominal pain.  Today he comes to the office for additional follow-up and he was found with tachycardia, hypotension and lower abdominal pain.  I will send patient for the emergency room for IV fluids, stat labs and CT scan of the abdominal pelvis.  Differential diagnoses are trigeminal abscess, anastomosis leak, urinary tract infection.  We will follow closely in the ED.        CT scan confirmed pneumoperitoneum with free fluid most likely coming from rectal anastomosis.    PLAN:  1. Exploratory laparotomy for ostomy creation 2. IV abs, IV fluid started at ED  Patient and his wife verbalized understanding, all questions were answered, and were agreeable with the plan outlined above.   Herbert Pun, MD  Electronically signed by Herbert Pun, MD

## 2020-07-08 NOTE — Progress Notes (Signed)
CODE SEPSIS - PHARMACY COMMUNICATION  **Broad Spectrum Antibiotics should be administered within 1 hour of Sepsis diagnosis**  Time Code Sepsis Called/Page Received: 1105  Antibiotics Ordered: Cipro and Flagyl  Time of 1st antibiotic administration: 1157  Additional action taken by pharmacy: Kathaleen Grinder, RN @ 1150.  Informed she had just obtained 2nd set of BCx and was in process of hanging first Abx.  Renda Rolls, PharmD, [facility] 07/08/2020 12:44 PM

## 2020-07-08 NOTE — Consult Note (Signed)
NAME:  Craig [name], MRN:  [id number], DOB:  [birth year], LOS: 0 ADMISSION DATE:  07/08/2020, CONSULTATION DATE:  07/08/2020 REFERRING MD:  Dr. Windell Moment, CHIEF COMPLAINT:  Abdominal pain, fever   Brief History   29 y.o. Male with recent Colostomy takedown on 06/28/20 (d/c from hospital on 07/03/20) admitted 07/08/20 with septic shock in the setting of pneumoperitoneum due to anastomosis leak requiring emergent Exploratory Laparotomy and ostomy creation.  History of present illness   Craig [name] is a 29 y.o. Male with a past medical history significant for diverticulitis with colon perforation in April 2021 with recent colostomy takedown on 06/28/2020 who presents to [facility] ED on 07/08/2020 due to abdominal pain, fever, and weakness. His symptoms began last night, and he was seen outpatient at his General Surgeon's office earlier this morning, and was found to be tachycardic, hypotensive, and again with lower abdominal pain.  He was sent directly to the ED for IV fluids, stat labs, and CT scan of the abdomin and pelvis.  ED Course Initial workup in the ED revealed WBC 41.9, hemoglobin 12.0, BUN 23, Creatinine 2.25, and lactic acid 3.2. He met sepsis criteria, therefore he received IV fluid resuscitation and broad spectrum antibiotics to cover for intra-abdominal pathology.  CT Abdomen & Pelvis showed pneumoperitoneum with free fluid concerning for anastomosis leak. He was taken emergently to the OR for Exploratory Laparotomy and ostomy creation.  Post procedure he returns to ICU, requiring small doses of Peripheral Neosynephrine.  PCCM is consulted for assistance in management of septic shock in the setting of pneumoperitoneum due to anastomosis leak requiring emergent Exploratory Laparotomy and ostomy creation.  Past Medical History  Diverticulitis Rupture of Bowel Hypertension Gout  Significant Hospital Events   11/19: Presented to ED, required emergent exploratory  laparotomy 11/19: Admitted to ICU post procedure, requiring Neosynephrine, PCCM consulted  Consults:  General Surgery (primary service) PCCM  Procedures:  11/19: Exploratory Laparotomy with ostomy creation  Significant Diagnostic Tests:  11/19: CT Abdomen/Pelvis>>1. Dehisced sigmoid colo-colostomy with free spillage of bowel contents into the lower abdomen and associated scattered small volume pneumoperitoneum. 2. Hepatic steatosis.  Micro Data:  11/19: SARS-CoV-2 PCR>>negative 11/19: Influenza PCR>>negatigve 11/19: Blood cultures x2>>  Antimicrobials:  Flagyl x1 dose 11/19 Gentamycin x1 dose 11/19 Ciprofloxacin x1 dose 11/19 Meropenem 11/19>>  Interim history/subjective:  -Returns to ICU post-procedure -On 30 mcg Neosynephrine, room air, NG tube to LIS -Afebrile -Denies abdominal pain, states he is comfortable s/p pain med administration   Objective   Blood pressure 105/63, pulse (!) 104, temperature 98.2 F (36.8 C), temperature source Oral, resp. rate (!) 25, height 6' (1.829 m), weight 117.9 kg, SpO2 94 %.        Intake/Output Summary (Last 24 hours) at 07/08/2020 1933 Last data filed at 07/08/2020 1900 Gross per 24 hour  Intake 4513.9 ml  Output 405 ml  Net 4108.9 ml   Filed Weights   07/08/20 0951  Weight: 117.9 kg    Examination: General: Acutely ill appearing male, laying in bed, on room air, in NAD HENT: Atraumatic, normocephalic, neck supple, no JVD Lungs: Clear to auscultation bilaterally, no wheezing or rales, even, nonlabored Cardiovascular: Regular rate and rhythm, s1s2, no M/R/G, 2+ distal puses Abdomen: Soft, tender, Hypoactive BS, Midline abdominal incision with honeycomb dressing clean dry and intact, JP drain with serosanguinous drainage to RLQ, Colostomy to LLQ Extremities: Normal bulk and tone, no deformities, no edema Neuro: A&O x4, follows commands, no focal deficits, speech clear, pupils PERRL  Resolved Hospital Problem list    N/A  Assessment & Plan:   Septic shock -Continuous cardiac monitoring -Maintain MAP >65 -Received IV Fluid resuscitation in ED -Continue IV Fluids @ 150 ml/hr -Vasopressors as needed to maintain MAP goal -Trend lactic acid   Severe Sepsis in setting of Pneumoperitoneum and anastomosis leak -Monitor fever curve -Trend WBC's & Procalcitonin -Follow cultures as above -Place on Meropenem for now -General Surgery following, appreciate input -S/p Emergent Laparotomy and Ostomy creation on 07/08/20   AKI -Monitor I&O's / urinary output -Follow BMP -Ensure adequate renal perfusion -Avoid nephrotoxic agents as able -Replace electrolytes as indicated -IV fluids   Best practice:  Diet: NPO Pain/Anxiety/Delirium protocol (if indicated): Morphine, Norco VAP protocol (if indicated): N/A DVT prophylaxis: Lovenox GI prophylaxis: Protonix IV Glucose control: N/A Mobility: As tolerated  Code Status: Full Code Family Communication: Updated pt at bedside 07/08/2020 Disposition: ICU  Labs   CBC: Recent Labs  Lab 07/03/20 0812 07/08/20 1020  WBC 13.1* 41.9*  HGB 10.0* 12.0*  HCT 28.7* 36.1*  MCV 95.7 97.6  PLT 237 638    Basic Metabolic Panel: Recent Labs  Lab 07/03/20 0812 07/08/20 1020  NA 137 136  K 3.2* 3.7  CL 105 98  CO2 22 24  GLUCOSE 106* 129*  BUN 7 23*  CREATININE 0.66 2.25*  CALCIUM 8.1* 8.5*  MG 1.8  --   PHOS 3.2  --    GFR: Estimated Creatinine Clearance: 63 mL/min (A) (by C-G formula based on SCr of 2.25 mg/dL (H)). Recent Labs  Lab 07/03/20 0812 07/08/20 1020 07/08/20 1100 07/08/20 1231  WBC 13.1* 41.9*  --   --   LATICACIDVEN  --   --  3.2* 3.2*    Liver Function Tests: Recent Labs  Lab 07/08/20 1020  AST 29  ALT 27  ALKPHOS 53  BILITOT 1.0  PROT 5.8*  ALBUMIN 3.1*   Recent Labs  Lab 07/08/20 1020  LIPASE 22   No results for input(s): AMMONIA in the last 168 hours.  ABG No results found for: PHART, PCO2ART, PO2ART,  HCO3, TCO2, ACIDBASEDEF, O2SAT   Coagulation Profile: No results for input(s): INR, PROTIME in the last 168 hours.  Cardiac Enzymes: No results for input(s): CKTOTAL, CKMB, CKMBINDEX, TROPONINI in the last 168 hours.  HbA1C: No results found for: HGBA1C  CBG: Recent Labs  Lab 07/08/20 1852  GLUCAP 134*    Review of Systems:   Positives in BOLD: Pt currently denies all complaints Gen: Denies fever, chills, weight change, fatigue, night sweats HEENT: Denies blurred vision, double vision, hearing loss, tinnitus, sinus congestion, rhinorrhea, sore throat, neck stiffness, dysphagia PULM: Denies shortness of breath, cough, sputum production, hemoptysis, wheezing CV: Denies chest pain, edema, orthopnea, paroxysmal nocturnal dyspnea, palpitations GI: Denies abdominal pain, nausea, vomiting, diarrhea, hematochezia, melena, constipation, change in bowel habits GU: Denies dysuria, hematuria, polyuria, oliguria, urethral discharge Endocrine: Denies hot or cold intolerance, polyuria, polyphagia or appetite change Derm: Denies rash, dry skin, scaling or peeling skin change Heme: Denies easy bruising, bleeding, bleeding gums Neuro: Denies headache, numbness, weakness, slurred speech, loss of memory or consciousness.   Past Medical History  He,  has a past medical history of Gout, Hypertension, and Rupture of bowel ([place]).   Surgical History    Past Surgical History:  Procedure Laterality Date  . COLONOSCOPY WITH PROPOFOL N/A 05/18/2020   Procedure: COLONOSCOPY WITH PROPOFOL;  Surgeon: Benjamine Sprague, DO;  Location: [facility] ENDOSCOPY;  Service: General;  Laterality: N/A;  .  COLOSTOMY N/A 11/22/2019   Procedure: COLOSTOMY;  Surgeon: Herbert Pun, MD;  Location: [facility] ORS;  Service: General;  Laterality: N/A;  . LAPAROTOMY N/A 11/22/2019   Procedure: EXPLORATORY LAPAROTOMY;  Surgeon: Herbert Pun, MD;  Location: [facility] ORS;  Service: General;  Laterality: N/A;  . PARTIAL COLECTOMY N/A  11/22/2019   Procedure: PARTIAL COLECTOMY;  Surgeon: Herbert Pun, MD;  Location: [facility] ORS;  Service: General;  Laterality: N/A;  . XI ROBOTIC ASSISTED COLOSTOMY TAKEDOWN N/A 06/28/2020   Procedure: XI ROBOTIC ASSISTED COLOSTOMY TAKEDOWN CONVERTED TO OPEN PROCEDURE;  Surgeon: Herbert Pun, MD;  Location: [facility] ORS;  Service: General;  Laterality: N/A;     Social History   reports that he has never smoked. His smokeless tobacco use includes chew. He reports current alcohol use. He reports that he does not use drugs.   Family History   His family history includes Healthy in his father and mother.   Allergies Allergies  Allergen Reactions  . Amoxicillin Rash     Home Medications  Prior to Admission medications   Medication Sig Start Date End Date Taking? Authorizing Provider  allopurinol (ZYLOPRIM) 300 MG tablet Take 300 mg by mouth daily. 07/04/20  Yes [provider]  carvedilol (COREG) 12.5 MG tablet Take 12.5 mg by mouth daily.    Yes [provider]  HYDROcodone-acetaminophen (NORCO/VICODIN) 5-325 MG tablet Take 1 tablet by mouth every 4 (four) hours as needed for moderate pain. 07/03/20  Yes Herbert Pun, MD  ondansetron (ZOFRAN-ODT) 4 MG disintegrating tablet Take 1 tablet (4 mg total) by mouth every 6 (six) hours as needed for nausea. 07/03/20  Yes Herbert Pun, MD  spironolactone (ALDACTONE) 25 MG tablet Take 25 mg by mouth daily.  05/12/20  Yes [provider]     Critical care time: 50 minutes     Darel Hong, [facility] Pager: [phone]

## 2020-07-08 NOTE — Anesthesia Preprocedure Evaluation (Signed)
Anesthesia Evaluation  Patient identified by MRN, date of birth, ID band Patient awake    Reviewed: Allergy & Precautions, NPO status , Patient's Chart, lab work & pertinent test results  History of Anesthesia Complications Negative for: history of anesthetic complications  Airway Mallampati: III  TM Distance: >3 FB Neck ROM: Full    Dental  (+) Poor Dentition   Pulmonary neg pulmonary ROS, neg sleep apnea, neg COPD,    breath sounds clear to auscultation- rhonchi (-) wheezing      Cardiovascular hypertension, Pt. on medications (-) CAD, (-) Past MI, (-) Cardiac Stents and (-) CABG  Rhythm:Regular Rate:Normal - Systolic murmurs and - Diastolic murmurs    Neuro/Psych neg Seizures negative neurological ROS  negative psych ROS   GI/Hepatic Neg liver ROS, Anastomotic leak   Endo/Other  negative endocrine ROSneg diabetes  Renal/GU negative Renal ROS     Musculoskeletal negative musculoskeletal ROS (+)   Abdominal (+) + obese,   Peds  Hematology negative hematology ROS (+)   Anesthesia Other Findings Past Medical History: No date: Gout No date: Hypertension No date: Rupture of bowel (HCC)   Reproductive/Obstetrics                             Anesthesia Physical Anesthesia Plan  ASA: II and emergent  Anesthesia Plan: General   Post-op Pain Management:    Induction: Intravenous, Rapid sequence and Cricoid pressure planned  PONV Risk Score and Plan: 1 and Ondansetron, Dexamethasone and Midazolam  Airway Management Planned: Oral ETT  Additional Equipment:   Intra-op Plan:   Post-operative Plan: Extubation in OR  Informed Consent: I have reviewed the patients History and Physical, chart, labs and discussed the procedure including the risks, benefits and alternatives for the proposed anesthesia with the patient or authorized representative who has indicated his/her understanding and  acceptance.     Dental advisory given  Plan Discussed with: CRNA and Anesthesiologist  Anesthesia Plan Comments:         Anesthesia Quick Evaluation

## 2020-07-08 NOTE — ED Triage Notes (Signed)
First RN Note: Pt to ED via wheelchair from Dr. Deniece Ree office at [facility], per staff member pt with hypotension and will need fluids and a CT scan and will be followed by Dr. Peyton Najjar while in the ED.

## 2020-07-08 NOTE — Progress Notes (Signed)
PHARMACIST - PHYSICIAN COMMUNICATION  CONCERNING:  Enoxaparin (Lovenox) for DVT Prophylaxis    RECOMMENDATION: Patient was prescribed enoxaprin 40mg  q24 hours for VTE prophylaxis.   Filed Weights   07/08/20 0951  Weight: 117.9 kg (260 lb)    Body mass index is 35.26 kg/m.  Estimated Creatinine Clearance: 63 mL/min (A) (by C-G formula based on SCr of 2.25 mg/dL (H)).   Based on [place] patient is candidate for enoxaparin 0.5mg /kg TBW SQ every 24 hours based on BMI being >30.  DESCRIPTION: Pharmacy has adjusted enoxaparin dose per [facility] policy.  Patient is now receiving enoxaparin 60 mg every q24h hours . Will start 11/12 at [place], PharmD Clinical Pharmacist  07/08/2020 12:28 PM

## 2020-07-08 NOTE — ED Notes (Signed)
Lav and LT Green blood tubes drawn and sent to lab

## 2020-07-08 NOTE — Consult Note (Signed)
Pharmacy Antibiotic Note  Craig [name] is a 29 y.o. Male with recent colostomy takedown on 06/28/20 (d/c from hospital on 07/03/20) admitted 07/08/20 with septic shock in the setting of pneumoperitoneum due to rectal anastomosis. Pharmacy has been consulted for meropenem dosing. His renal function is significantly worse than his baseline level. He has a noted allergy to amoxicillin (rash)  Plan: start meropenem 1 gram IV every 8 hours  Height: 6' (182.9 cm) Weight: 117.9 kg (260 lb) IBW/kg (Calculated) : 77.6  Temp (24hrs), Avg:98.8 F (37.1 C), Min:97.8 F (36.6 C), Max:100 F (37.8 C)  Recent Labs  Lab 07/03/20 0812 07/08/20 1020 07/08/20 1100 07/08/20 1231  WBC 13.1* 41.9*  --   --   CREATININE 0.66 2.25*  --   --   LATICACIDVEN  --   --  3.2* 3.2*    Estimated Creatinine Clearance: 63 mL/min (A) (by C-G formula based on SCr of 2.25 mg/dL (H)).    Allergies  Allergen Reactions  . Amoxicillin Rash    Antimicrobials this admission: gentamicin x 1 ciprofloxacin x 1 metronidazole x 1 11/19 meropenem >>   Microbiology results: 11/19 BCx: pending 11/19 SARS CoV-2: negative 11/19 influenza A/B: negative 11/19 MRSA PCR: pending  Thank you for allowing pharmacy to be a part of this patient's care.  Dallie Piles 07/08/2020 7:39 PM

## 2020-07-08 NOTE — Progress Notes (Signed)
Pt arrived to unit at this time from PACU. Pt is alert and oriented but drowsy. Pt c/o abdominal surgical pain. Pt is on neo gtt at 67mcg at this time. Pt on room air. Wife is at bedside. Pt has midline abdominal incision with honeycomb dressing. JP and colostomy are present.

## 2020-07-08 NOTE — Anesthesia Procedure Notes (Signed)
Procedure Name: Intubation Date/Time: 07/08/2020 12:44 PM Performed by: Johnna Acosta, CRNA Pre-anesthesia Checklist: Patient identified, Emergency Drugs available, Suction available, Patient being monitored and Timeout performed Patient Re-evaluated:Patient Re-evaluated prior to induction Oxygen Delivery Method: Circle system utilized Preoxygenation: Pre-oxygenation with 100% oxygen Induction Type: IV induction, Rapid sequence and Cricoid Pressure applied Laryngoscope Size: McGraph and 3 Grade View: Grade I Tube type: Oral Tube size: 7.5 mm Number of attempts: 1 Airway Equipment and Method: Stylet,  Video-laryngoscopy and Oral airway Placement Confirmation: ETT inserted through vocal cords under direct vision,  positive ETCO2 and breath sounds checked- equal and bilateral Secured at: 22 cm Tube secured with: Tape Dental Injury: Teeth and Oropharynx as per pre-operative assessment  Difficulty Due To: Difficulty was anticipated, Difficult Airway- due to large tongue and Difficult Airway- due to limited oral opening

## 2020-07-08 NOTE — Plan of Care (Signed)
Will maintain hydraation

## 2020-07-08 NOTE — Sepsis Progress Note (Signed)
Code Sepsis patient transferred to OR. Will continue to monitor progress peripherally until pt is transferred to an inpatient bed.

## 2020-07-08 NOTE — Anesthesia Postprocedure Evaluation (Signed)
Anesthesia Post Note  Patient: Craig [name]  Procedure(s) Performed: EXPLORATORY LAPAROTOMY (N/A )  Patient location during evaluation: PACU Anesthesia Type: General Level of consciousness: awake and alert Pain management: pain level controlled Vital Signs Assessment: post-procedure vital signs reviewed and stable Respiratory status: spontaneous breathing, nonlabored ventilation, respiratory function stable and patient connected to nasal cannula oxygen Cardiovascular status: blood pressure returned to baseline and stable Postop Assessment: no apparent nausea or vomiting Anesthetic complications: no   No complications documented.   Last Vitals:  Vitals:   07/08/20 1926 07/08/20 1930  BP: 105/63 (!) 90/56  Pulse:  (!) 101  Resp:  20  Temp:    SpO2: 94% 94%    Last Pain:  Vitals:   07/08/20 1926  TempSrc:   PainSc: 7                  Arita Miss

## 2020-07-08 NOTE — Progress Notes (Addendum)
Pharmacy Antibiotic Note  Craig [name] is a 29 y.o. male admitted on 07/08/2020 with intra-abdominal infection.  Pharmacy has been consulted for gentamicin dosing.  CT scan confirmed pneumoperitoneum with free fluid most likely coming from rectal anastomosis  Pt started on Cipro and Flagyl.  Flagyl given in ER prior to pt being transferred to OR.  TBW 117.9 kg, BMI 35.3, ABW 93.7kg SCr 2.25 (up from 0.66 on 11/14), CrCl 63 WBC 41.9, LA 3.2   Plan: Ordered Gentamicin 7mg /kg based on ABW q24hr.  Pharmacy will continue to follow pt SCr and will order random Gent levels and/or adjust dose as appropriate.  Height: 6' (182.9 cm) Weight: 117.9 kg (260 lb) IBW/kg (Calculated) : 77.6  Temp (24hrs), Avg:99.5 F (37.5 C), Min:98.9 F (37.2 C), Max:100 F (37.8 C)  Recent Labs  Lab 07/03/20 0812 07/08/20 1020 07/08/20 1100  WBC 13.1* 41.9*  --   CREATININE 0.66 2.25*  --   LATICACIDVEN  --   --  3.2*    Estimated Creatinine Clearance: 63 mL/min (A) (by C-G formula based on SCr of 2.25 mg/dL (H)).    Allergies  Allergen Reactions   Amoxicillin Rash    Antimicrobials this admission: 11/19 Flagyl >>  11/19 Cipro >>  11/19 Gent >>   Microbiology results: 11/19 BCx (Peripheral): Pending 11/19 BCx: Pending  Thank you for allowing pharmacy to be a part of this patients care.  Renda Rolls, PharmD, [facility] 07/08/2020 12:54 PM

## 2020-07-08 NOTE — Transfer of Care (Signed)
Immediate Anesthesia Transfer of Care Note  Patient: Craig [name]  Procedure(s) Performed: EXPLORATORY LAPAROTOMY (N/A )  Patient Location: PACU  Anesthesia Type:General  Level of Consciousness: awake, alert  and oriented  Airway & Oxygen Therapy: Patient Spontanous Breathing and Patient connected to face mask oxygen  Post-op Assessment: Report given to RN  Post vital signs: Reviewed and stable  Last Vitals:  Vitals Value Taken Time  BP 91/47 07/08/20 1644  Temp    Pulse 108 07/08/20 1644  Resp 26 07/08/20 1644  SpO2 96 % 07/08/20 1644  Vitals shown include unvalidated device data.  Last Pain:  Vitals:   07/08/20 1230  TempSrc: Tympanic  PainSc: 8          Complications: No complications documented.

## 2020-07-08 NOTE — Sepsis Progress Note (Signed)
Elink monitoring this Code Sepsis. 

## 2020-07-09 ENCOUNTER — Encounter: Payer: Self-pay | Admitting: General Surgery

## 2020-07-09 LAB — COMPREHENSIVE METABOLIC PANEL
ALT: 40 U/L (ref 0–44)
AST: 32 U/L (ref 15–41)
Albumin: 2.3 g/dL — ABNORMAL LOW (ref 3.5–5.0)
Alkaline Phosphatase: 45 U/L (ref 38–126)
Anion gap: 8 (ref 5–15)
BUN: 21 mg/dL — ABNORMAL HIGH (ref 6–20)
CO2: 23 mmol/L (ref 22–32)
Calcium: 7.6 mg/dL — ABNORMAL LOW (ref 8.9–10.3)
Chloride: 107 mmol/L (ref 98–111)
Creatinine, Ser: 0.84 mg/dL (ref 0.61–1.24)
GFR, Estimated: >60 mL/min (ref >60)
Glucose, Bld: 124 mg/dL — ABNORMAL HIGH (ref 70–99)
Potassium: 3.9 mmol/L (ref 3.5–5.1)
Sodium: 138 mmol/L (ref 135–145)
Total Bilirubin: 1 mg/dL (ref 0.3–1.2)
Total Protein: 5 g/dL — ABNORMAL LOW (ref 6.5–8.1)

## 2020-07-09 LAB — PROCALCITONIN: Procalcitonin: 20.73 ng/mL

## 2020-07-09 LAB — URINALYSIS, COMPLETE (UACMP) WITH MICROSCOPIC
Bacteria, UA: NONE SEEN
Bilirubin Urine: NEGATIVE
Glucose, UA: NEGATIVE mg/dL
Ketones, ur: NEGATIVE mg/dL
Leukocytes,Ua: NEGATIVE
Nitrite: NEGATIVE
Protein, ur: NEGATIVE mg/dL
Specific Gravity, Urine: 1.013 (ref 1.005–1.030)
Squamous Epithelial / HPF: NONE SEEN (ref 0–5)
pH: 5 (ref 5.0–8.0)

## 2020-07-09 LAB — PHOSPHORUS: Phosphorus: 3.1 mg/dL (ref 2.5–4.6)

## 2020-07-09 LAB — CBC
HCT: 32.5 % — ABNORMAL LOW (ref 39.0–52.0)
Hemoglobin: 10.8 g/dL — ABNORMAL LOW (ref 13.0–17.0)
MCH: 32.3 pg (ref 26.0–34.0)
MCHC: 33.2 g/dL (ref 30.0–36.0)
MCV: 97.3 fL (ref 80.0–100.0)
Platelets: 237 10*3/uL (ref 150–400)
RBC: 3.34 MIL/uL — ABNORMAL LOW (ref 4.22–5.81)
RDW: 13.6 % (ref 11.5–15.5)
WBC: 19.9 10*3/uL — ABNORMAL HIGH (ref 4.0–10.5)
nRBC: 0 % (ref 0.0–0.2)

## 2020-07-09 LAB — MAGNESIUM: Magnesium: 1.8 mg/dL (ref 1.7–2.4)

## 2020-07-09 MED ORDER — ALLOPURINOL 100 MG PO TABS
300.0000 mg | ORAL_TABLET | ORAL | Status: DC
  Administered 2020-07-10 – 2020-07-11 (×2): 300 mg
  Filled 2020-07-09: qty 3
  Filled 2020-07-09: qty 1

## 2020-07-09 MED ORDER — CARVEDILOL 12.5 MG PO TABS
12.5000 mg | ORAL_TABLET | Freq: Two times a day (BID) | ORAL | Status: DC
  Administered 2020-07-11: 12.5 mg
  Filled 2020-07-09 (×2): qty 1

## 2020-07-09 MED ORDER — HYDROCODONE-ACETAMINOPHEN 5-325 MG PO TABS
1.0000 | ORAL_TABLET | ORAL | Status: DC | PRN
  Administered 2020-07-09 – 2020-07-12 (×8): 2
  Administered 2020-07-12: 1
  Administered 2020-07-12 – 2020-07-14 (×6): 2
  Administered 2020-07-14: 1
  Filled 2020-07-09 (×5): qty 2
  Filled 2020-07-09: qty 1
  Filled 2020-07-09: qty 2
  Filled 2020-07-09: qty 1
  Filled 2020-07-09 (×8): qty 2

## 2020-07-09 MED ORDER — LORAZEPAM 2 MG/ML IJ SOLN
1.0000 mg | Freq: Every day | INTRAMUSCULAR | Status: DC
  Administered 2020-07-11 – 2020-07-13 (×3): 1 mg via INTRAVENOUS
  Filled 2020-07-09 (×3): qty 1

## 2020-07-09 MED ORDER — CHLORHEXIDINE GLUCONATE CLOTH 2 % EX PADS: 6.0000 | MEDICATED_PAD | Freq: Every day | CUTANEOUS | Status: DC

## 2020-07-09 NOTE — Consult Note (Signed)
NAME:  Craig [name], MRN:  [id number], DOB:  November 24, 1990, LOS: 1 ADMISSION DATE:  07/08/2020, CONSULTATION DATE:  07/08/2020 REFERRING MD:  Dr. Windell Moment, CHIEF COMPLAINT:  Abdominal pain, fever   Brief History   29 y.o. Male with recent Colostomy takedown on 06/28/20 (d/c from hospital on 07/03/20) admitted 07/08/20 with septic shock in the setting of pneumoperitoneum due to anastomosis leak requiring emergent Exploratory Laparotomy and ostomy creation.  History of present illness   Craig [name] is a 29 y.o. Male with a past medical history significant for diverticulitis with colon perforation in April 2021 with recent colostomy takedown on 06/28/2020 who presents to [facility] ED on 07/08/2020 due to abdominal pain, fever, and weakness. His symptoms began last night, and he was seen outpatient at his General Surgeon's office earlier this morning, and was found to be tachycardic, hypotensive, and again with lower abdominal pain.  He was sent directly to the ED for IV fluids, stat labs, and CT scan of the abdomin and pelvis.  ED Course Initial workup in the ED revealed WBC 41.9, hemoglobin 12.0, BUN 23, Creatinine 2.25, and lactic acid 3.2. He met sepsis criteria, therefore he received IV fluid resuscitation and broad spectrum antibiotics to cover for intra-abdominal pathology.  CT Abdomen & Pelvis showed pneumoperitoneum with free fluid concerning for anastomosis leak. He was taken emergently to the OR for Exploratory Laparotomy and ostomy creation.  Post procedure he returns to ICU, requiring small doses of Peripheral Neosynephrine.  PCCM is consulted for assistance in management of septic shock in the setting of pneumoperitoneum due to anastomosis leak requiring emergent Exploratory Laparotomy and ostomy creation.    Events   11/19: Presented to ED, required emergent exploratory laparotomy 11/20-  Patient asked to remove NG tube I have explained he still has active bile draining and  is unable to eat.  He want to have PO diet.  He shares analgesia is adequate. Vitals are stable.    Past Medical History  Diverticulitis Rupture of Bowel Hypertension Gout  dmitted to ICU post procedure, requiring Neosynephrine, PCCM consulted  Consults:  General Surgery (primary service) PCCM  Procedures:  11/19: Exploratory Laparotomy with ostomy creation  Significant Diagnostic Tests:  11/19: CT Abdomen/Pelvis>>1. Dehisced sigmoid colo-colostomy with free spillage of bowel contents into the lower abdomen and associated scattered small volume pneumoperitoneum. 2. Hepatic steatosis.  Micro Data:  11/19: SARS-CoV-2 PCR>>negative 11/19: Influenza PCR>>negatigve 11/19: Blood cultures x2>>  Antimicrobials:  Flagyl x1 dose 11/19 Gentamycin x1 dose 11/19 Ciprofloxacin x1 dose 11/19 Meropenem 11/19>>  Interim history/subjective:  -Returns to ICU post-procedure -On 30 mcg Neosynephrine, room air, NG tube to LIS -Afebrile -Denies abdominal pain, states he is comfortable s/p pain med administration   Objective   Blood pressure (!) 112/57, pulse (!) 105, temperature 99 F (37.2 C), temperature source Oral, resp. rate (!) 22, height 6' (1.829 m), weight 117.9 kg, SpO2 94 %.        Intake/Output Summary (Last 24 hours) at 07/09/2020 1635 Last data filed at 07/09/2020 1523 Gross per 24 hour  Intake 3539.39 ml  Output 4518 ml  Net -978.61 ml   Filed Weights   07/08/20 0951  Weight: 117.9 kg    Examination: General: Acutely ill appearing male, laying in bed, on room air, in NAD HENT: Atraumatic, normocephalic, neck supple, no JVD Lungs: Clear to auscultation bilaterally, no wheezing or rales, even, nonlabored Cardiovascular: Regular rate and rhythm, s1s2, no M/R/G, 2+ distal puses Abdomen: Soft, tender, Hypoactive BS, Midline  abdominal incision with honeycomb dressing clean dry and intact, JP drain with serosanguinous drainage to RLQ, Colostomy to LLQ Extremities:  Normal bulk and tone, no deformities, no edema Neuro: A&O x4, follows commands, no focal deficits, speech clear, pupils PERRL  Resolved Hospital Problem list   N/A  Assessment & Plan:   Septic shock-resolved  -Continuous cardiac monitoring -Maintain MAP >65 -Received IV Fluid resuscitation in ED -Continue IV Fluids @ 150 ml/hr -Vasopressors as needed to maintain MAP goal- weaned off neosynephrine -Trend lactic acid   Severe Sepsis in setting of Pneumoperitoneum and anastomosis leak -Monitor fever curve -Trend WBC's & Procalcitonin -Follow cultures as above -Place on Meropenem for now -General Surgery following, appreciate input -S/p Emergent Laparotomy and Ostomy creation on 07/08/20   AKI -Monitor I&O's / urinary output -Follow BMP -Ensure adequate renal perfusion -Avoid nephrotoxic agents as able -Replace electrolytes as indicated -IV fluids   Best practice:  Diet: NPO Pain/Anxiety/Delirium protocol (if indicated): Morphine, Norco VAP protocol (if indicated): N/A DVT prophylaxis: Lovenox GI prophylaxis: Protonix IV Glucose control: N/A Mobility: As tolerated  Code Status: Full Code Family Communication: Updated pt at bedside 07/08/2020 Disposition: ICU  Labs   CBC: Recent Labs  Lab 07/03/20 0812 07/08/20 1020 07/09/20 0511  WBC 13.1* 41.9* 19.9*  HGB 10.0* 12.0* 10.8*  HCT 28.7* 36.1* 32.5*  MCV 95.7 97.6 97.3  PLT 237 399 952    Basic Metabolic Panel: Recent Labs  Lab 07/03/20 0812 07/08/20 1020 07/09/20 0731  NA 137 136 138  K 3.2* 3.7 3.9  CL 105 98 107  CO2 '22 24 23  ' GLUCOSE 106* 129* 124*  BUN 7 23* 21*  CREATININE 0.66 2.25* 0.84  CALCIUM 8.1* 8.5* 7.6*  MG 1.8  --  1.8  PHOS 3.2  --  3.1   GFR: Estimated Creatinine Clearance: 168.9 mL/min (by C-G formula based on SCr of 0.84 mg/dL). Recent Labs  Lab 07/03/20 0812 07/08/20 1020 07/08/20 1100 07/08/20 1231 07/08/20 2036 07/08/20 2226 07/09/20 0511 07/09/20 0731   PROCALCITON  --   --   --   --  23.98  --   --  20.73  WBC 13.1* 41.9*  --   --   --   --  19.9*  --   LATICACIDVEN  --   --  3.2* 3.2* 2.9* 2.6*  --   --     Liver Function Tests: Recent Labs  Lab 07/08/20 1020 07/09/20 0731  AST 29 32  ALT 27 40  ALKPHOS 53 45  BILITOT 1.0 1.0  PROT 5.8* 5.0*  ALBUMIN 3.1* 2.3*   Recent Labs  Lab 07/08/20 1020  LIPASE 22   No results for input(s): AMMONIA in the last 168 hours.  ABG No results found for: PHART, PCO2ART, PO2ART, HCO3, TCO2, ACIDBASEDEF, O2SAT   Coagulation Profile: No results for input(s): INR, PROTIME in the last 168 hours.  Cardiac Enzymes: No results for input(s): CKTOTAL, CKMB, CKMBINDEX, TROPONINI in the last 168 hours.  HbA1C: No results found for: HGBA1C  CBG: Recent Labs  Lab 07/08/20 1852  GLUCAP 134*    Review of Systems:   Positives in BOLD: Pt currently denies all complaints Gen: Denies fever, chills, weight change, fatigue, night sweats HEENT: Denies blurred vision, double vision, hearing loss, tinnitus, sinus congestion, rhinorrhea, sore throat, neck stiffness, dysphagia PULM: Denies shortness of breath, cough, sputum production, hemoptysis, wheezing CV: Denies chest pain, edema, orthopnea, paroxysmal nocturnal dyspnea, palpitations GI: Denies abdominal pain, nausea, vomiting, diarrhea, hematochezia,  melena, constipation, change in bowel habits GU: Denies dysuria, hematuria, polyuria, oliguria, urethral discharge Endocrine: Denies hot or cold intolerance, polyuria, polyphagia or appetite change Derm: Denies rash, dry skin, scaling or peeling skin change Heme: Denies easy bruising, bleeding, bleeding gums Neuro: Denies headache, numbness, weakness, slurred speech, loss of memory or consciousness.   Past Medical History  He,  has a past medical history of Gout, Hypertension, and Rupture of bowel ([place]).   Surgical History    Past Surgical History:  Procedure Laterality Date  . COLONOSCOPY  WITH PROPOFOL N/A 05/18/2020   Procedure: COLONOSCOPY WITH PROPOFOL;  Surgeon: Benjamine Sprague, DO;  Location: [facility] ENDOSCOPY;  Service: General;  Laterality: N/A;  . COLOSTOMY N/A 11/22/2019   Procedure: COLOSTOMY;  Surgeon: Herbert Pun, MD;  Location: [facility] ORS;  Service: General;  Laterality: N/A;  . LAPAROTOMY N/A 11/22/2019   Procedure: EXPLORATORY LAPAROTOMY;  Surgeon: Herbert Pun, MD;  Location: [facility] ORS;  Service: General;  Laterality: N/A;  . LAPAROTOMY N/A 07/08/2020   Procedure: EXPLORATORY LAPAROTOMY;  Surgeon: Herbert Pun, MD;  Location: [facility] ORS;  Service: General;  Laterality: N/A;  . PARTIAL COLECTOMY N/A 11/22/2019   Procedure: PARTIAL COLECTOMY;  Surgeon: Herbert Pun, MD;  Location: [facility] ORS;  Service: General;  Laterality: N/A;  . XI ROBOTIC ASSISTED COLOSTOMY TAKEDOWN N/A 06/28/2020   Procedure: XI ROBOTIC ASSISTED COLOSTOMY TAKEDOWN CONVERTED TO OPEN PROCEDURE;  Surgeon: Herbert Pun, MD;  Location: [facility] ORS;  Service: General;  Laterality: N/A;     Social History   reports that he has never smoked. His smokeless tobacco use includes chew. He reports current alcohol use. He reports that he does not use drugs.   Family History   His family history includes Healthy in his father and mother.   Allergies Allergies  Allergen Reactions  . Amoxicillin Rash     Home Medications  Prior to Admission medications   Medication Sig Start Date End Date Taking? Authorizing Provider  allopurinol (ZYLOPRIM) 300 MG tablet Take 300 mg by mouth daily. 07/04/20  Yes [provider]  carvedilol (COREG) 12.5 MG tablet Take 12.5 mg by mouth daily.    Yes [provider]  HYDROcodone-acetaminophen (NORCO/VICODIN) 5-325 MG tablet Take 1 tablet by mouth every 4 (four) hours as needed for moderate pain. 07/03/20  Yes Herbert Pun, MD  ondansetron (ZOFRAN-ODT) 4 MG disintegrating tablet Take 1 tablet (4 mg total) by mouth every 6  (six) hours as needed for nausea. 07/03/20  Yes Herbert Pun, MD  spironolactone (ALDACTONE) 25 MG tablet Take 25 mg by mouth daily.  05/12/20  Yes [provider]     Critical care time: 33 minutes     Critical care provider statement:    Critical care time (minutes):  33   Critical care time was exclusive of:  Separately billable procedures and  treating other patients   Critical care was necessary to treat or prevent imminent or  life-threatening deterioration of the following conditions:  sepsis, diverticultits with pneumoperitoneum and leakage of anastamosis.    Critical care was time spent personally by me on the following  activities:  Development of treatment plan with patient or surrogate,  discussions with consultants, evaluation of patient's response to  treatment, examination of patient, obtaining history from patient or  surrogate, ordering and performing treatments and interventions, ordering  and review of laboratory studies and re-evaluation of patient's condition   I assumed direction of critical care for this patient from another  provider in my  specialty: no      Ottie Glazier, M.D.  [facility] [place]

## 2020-07-09 NOTE — Consult Note (Signed)
Pharmacy Antibiotic Note  Craig [name] is a 29 y.o. Male with recent colostomy takedown on 06/28/20 (d/c from hospital on 07/03/20) admitted 07/08/20 with septic shock in the setting of pneumoperitoneum due to rectal anastomosis. Pharmacy has been consulted for meropenem dosing. His renal function is significantly worse than his baseline level. He has a noted allergy to amoxicillin (rash)  Plan: continue meropenem 1 gram IV every 8 hours  Height: 6' (182.9 cm) Weight: 117.9 kg (260 lb) IBW/kg (Calculated) : 77.6  Temp (24hrs), Avg:98.6 F (37 C), Min:97.8 F (36.6 C), Max:99.3 F (37.4 C)  Recent Labs  Lab 07/03/20 0812 07/08/20 1020 07/08/20 1100 07/08/20 1231 07/08/20 2036 07/08/20 2226 07/09/20 0511 07/09/20 0731  WBC 13.1* 41.9*  --   --   --   --  19.9*  --   CREATININE 0.66 2.25*  --   --   --   --   --  0.84  LATICACIDVEN  --   --  3.2* 3.2* 2.9* 2.6*  --   --     Estimated Creatinine Clearance: 168.9 mL/min (by C-G formula based on SCr of 0.84 mg/dL).    Allergies  Allergen Reactions  . Amoxicillin Rash    Antimicrobials this admission: gentamicin x 1 ciprofloxacin x 1 metronidazole x 1 11/19 meropenem >>   Microbiology results: 11/19 BCx: pending 11/19 SARS CoV-2: negative 11/19 influenza A/B: negative 11/19 MRSA PCR: pending  Thank you for allowing pharmacy to be a part of this patient's care.  [name] 07/09/2020 1:12 PM

## 2020-07-09 NOTE — Progress Notes (Signed)
[facility] Day(s): 1.   Post op day(s): 1 Day Post-Op.   Interval History: Patient seen and examined, no acute events or new complaints overnight. Patient reports remaining thirsty, and wanting something to drink.  NG tube remains in place with significant bilious return.  Pain seems to be reasonably controlled.  Currently off all pressors.  Still getting IV fluids at 200 mL/h.   Vital signs in last 24 hours: [min-max] current  Temp:  [97.8 F (36.6 C)-100 F (37.8 C)] 98.9 F (37.2 C) (11/20 0800) Pulse Rate:  [78-116] 103 (11/20 1000) Resp:  [11-31] 20 (11/20 1000) BP: (76-125)/(40-72) 98/69 (11/20 1000) SpO2:  [87 %-100 %] 94 % (11/20 1000)     Height: 6' (182.9 cm) Weight: 117.9 kg BMI (Calculated): 35.25   Intake/Output last 2 shifts:  11/19 0701 - 11/20 0700 In: 6181.4 [I.V.:5583.4; IV Piggyback:598] Out: 4270 [WCBJS:2831; Drains:118; Blood:100]  Urine still somewhat tea colored. Physical Exam:  Constitutional: alert, cooperative and no distress  Respiratory: breathing non-labored at rest  Cardiovascular: regular rate and sinus rhythm  Gastrointestinal: Expected tenderness, midline dressing in place.  Hypoactive bowel sounds.  Stoma discolored/dusky appearance.  Integumentary: Warm, normal turgor, no remarkable edema.  Labs:  CBC Latest Ref Rng & Units 07/09/2020 07/08/2020 07/03/2020  WBC 4.0 - 10.5 K/uL 19.9(H) 41.9(H) 13.1(H)  Hemoglobin 13.0 - 17.0 g/dL 10.8(L) 12.0(L) 10.0(L)  Hematocrit 39 - 52 % 32.5(L) 36.1(L) 28.7(L)  Platelets 150 - 400 K/uL 237 399 237   CMP Latest Ref Rng & Units 07/09/2020 07/08/2020 07/03/2020  Glucose 70 - 99 mg/dL 124(H) 129(H) 106(H)  BUN 6 - 20 mg/dL 21(H) 23(H) 7  Creatinine 0.61 - 1.24 mg/dL 0.84 2.25(H) 0.66  Sodium 135 - 145 mmol/L 138 136 137  Potassium 3.5 - 5.1 mmol/L 3.9 3.7 3.2(L)  Chloride 98 - 111 mmol/L 107 98 105  CO2 22 - 32 mmol/L 23 24 22   Calcium 8.9 - 10.3 mg/dL  7.6(L) 8.5(L) 8.1(L)  Total Protein 6.5 - 8.1 g/dL 5.0(L) 5.8(L) -  Total Bilirubin 0.3 - 1.2 mg/dL 1.0 1.0 -  Alkaline Phos 38 - 126 U/L 45 53 -  AST 15 - 41 U/L 32 29 -  ALT 0 - 44 U/L 40 27 -     Imaging studies: No new pertinent imaging studies   Assessment/Plan:  29 y.o. male with postoperative anastomotic leak 1 Day Post-Op s/p Hartman's procedure.   Patient Active Problem List   Diagnosis Date Noted  . 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 Pneumoperitoneum 07/08/2020  . 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 Diverticulitis of colon with perforation 11/22/2019    -Agree with continuing bowel rest with NG tube to assist with anticipated postoperative ileus.  -Continue antibiotics of meropenem.  -Prerenal AKI appears to be resolving.   -Transfer off unit as per intensivist care.  I feel keeping him in the unit is well worthwhile for at least another 24 hours.  All of the above findings and recommendations were discussed with the patient, patient's family, and the medical team, and all of patient's and family's questions were answered to their expressed satisfaction.  -- Ronny Bacon, M.D., [facility] 07/09/2020

## 2020-07-09 NOTE — Op Note (Addendum)
Preoperative diagnosis: Anastomosis leak  Postoperative diagnosis: Anastomosis leak  Procedure: Re opening of recent laparotomy                       Partial colectomy with colostomy (Hartmann's procedure).   Anesthesia: GETA  Surgeon: Dr. Windell Moment, MD  Assistant Surgeon: Dr. [name]  Wound Classification: Dirty  Indications:  Patient is a 29 y.o. male with recent colostomy takedown. Patient presented to follow up appointment 10 days after colostomy takedown with tachycardia, hypotension, fever and CT scan with pneumoperitoneum.  Emergent resection was indicated.  Description of procedure:  The patient was placed in the supine position and general endotracheal anesthesia was induced. A time-out was completed verifying correct patient, procedure, site, positioning, and implant(s) and/or special equipment prior to beginning this procedure. Preoperative antibiotics were given. A Foley catheter and nasogastric tube were placed. The abdomen was prepped and draped in the usual sterile fashion. A vertical midline incision was made from xiphoid to just above the pubis using previous incision. This was deepened through the subcutaneous tissues and hemostasis was achieved with electrocautery. The linea alba was identified and incised and the peritoneal cavity entered. The abdomen was explored. Time consuming lysis of adhesions needed to be done due to recent laparotomy.  Generalized peritonitis was found.  The small bowel was inspected and retracted to the right using a moist towel and self-retaining retractor. After suction of fecal peritonitis, dehiscence of half of the colorectal anastomosis was identified.  Points of transection were selected proximally and distally. The proximal descending colon was divided with the linear cutting stapler. The peritoneum overlying the mesentery was then scored with electrocautery and the left colic artery was identified, double ligated with 2-0 silk sutures, and  transected. The rectum was divided using Contour device. The mesentery of the rectum and the descending colon was divided with LigaSure. The abdominal cavity was then copiously irrigated and hemostasis was checked.  The proximal descending colon reached easily to the proposed colostomy site without tension. The previous colostomy incision was deepened through all layers of the abdominal wall and dilated to admit two fingers. The colon was passed out through the ostomy site without torsion or tension.    The Hartmann's pouch was tagged with two long sutures of 2-0 prolene and allowed to fall into the pelvis.  A closed suction drain was placed in the pelvis and brought out through separate stab wounds lateral to the incision. These were secured with 3-0 nylon.  The fascia was closed with a running suture of PDS 0. The skin was closed with skin staples.  The colostomy was matured with multiple interrupted sutures of 3-0 Vicryl. An ostomy bag was applied.  The patient tolerated the procedure well and was taken to the postanesthesia care unit in septic shock and admitted to step down unit.   Specimen: Descending colon and rectum  Complications: None  EBL: 154mL

## 2020-07-10 LAB — PROCALCITONIN: Procalcitonin: 8.89 ng/mL

## 2020-07-10 MED ORDER — INFLUENZA VAC SPLIT QUAD 0.5 ML IM SUSY
0.5000 mL | PREFILLED_SYRINGE | INTRAMUSCULAR | Status: DC
  Filled 2020-07-10: qty 0.5

## 2020-07-10 MED ORDER — GUAIFENESIN ER 600 MG PO TB12
1200.0000 mg | ORAL_TABLET | Freq: Two times a day (BID) | ORAL | Status: DC
  Administered 2020-07-10 – 2020-07-14 (×8): 1200 mg via ORAL
  Filled 2020-07-10 (×8): qty 2

## 2020-07-10 MED ORDER — GUAIFENESIN-DM 100-10 MG/5ML PO SYRP
5.0000 mL | ORAL_SOLUTION | ORAL | Status: DC | PRN
  Administered 2020-07-10 – 2020-07-13 (×2): 5 mL via ORAL
  Filled 2020-07-10 (×3): qty 5

## 2020-07-10 NOTE — Evaluation (Signed)
Physical Therapy Evaluation Patient Details Name: Craig [name] MRN: [id number] DOB: 26-Aug-1990 Today's Date: 07/10/2020   History of Present Illness  Pt is a 29 y/o M with colostomy takedown on 06/28/20 & d/c on 07/03/20. Pt doing well at home until the day before where pt felt weak with lower abdominal pain & fever. Pt evaluated in outpatient office & found to have tachycardia, hypotension & ongoing pain. Pt admitted on 07/08/20 & CT revealed dehiscence with bowel content leaking into abdomen. Pt taken to OR for re-opening of recent laparotomy & partial colectomy with colostomy.  Clinical Impression  Nurse reports pt is no longer on IV medication for BP control & clears pt for participation, noting pt's HR continues to be elevated at rest. HR = 106 bpm supine in bed, increased to 135 bpm sitting EOB. SpO2 dropped to 83% on room air when pt transferred sidelying>sitting & pt reports "that's because I was holding my breath" (depsite education for pursed lip breathing) & SpO2 quickly returned to >/= 90% once pt not holding breath.  PT educates pt on log rolling to decrease pulling & increase ease of bed mobility but pt requires multimodal cuing to complete technique & mod assist for overall bed mobility. Pt is limited by significant pain with movement but willing to sit EOB & performs BLE LAQ (1 set x 10 reps). Pt also utilized incentive spirometer & PT educates pt on use of flutter valve. After sitting EOB ~3 minutes pt returns supine.   Anticipate pt will progress well with mobility once his pain is more controlled. Will continue to follow pt acutely to focus on deficits noted & to increase independence with bed mobility, transfers, & for gait training.     Follow Up Recommendations Home health PT;Supervision/Assistance - 24 hour;Supervision for mobility/OOB    Equipment Recommendations  None recommended by PT (pt reports they have DME at home)    Recommendations for Other Services        Precautions / Restrictions Precautions Precautions: Fall Precaution Comments: log roll for comfort for abdominal incisions Restrictions Weight Bearing Restrictions: No      Mobility  Bed Mobility Overal bed mobility: Needs Assistance Bed Mobility: Sit to Supine;Rolling;Sidelying to Sit Rolling: Min assist (bed rails, HOB elevated) Sidelying to sit: Mod assist;HOB elevated   Sit to supine: Mod assist;HOB elevated   General bed mobility comments: HOB elevated, pt prefers to hold to wife's stable hand, reporting that's how they've been doing it at home; +2 total assist to scoot to [facility] once supine    Transfers Overall transfer level:  (deferred) - pt requests not to stand 2/2 abdominal pain                  Ambulation/Gait                Stairs            Wheelchair Mobility    Modified Rankin (Stroke Patients Only)       Balance Overall balance assessment: Mild deficits observed, not formally tested;Needs assistance Sitting-balance support: Feet unsupported;Bilateral upper extremity supported Sitting balance-Leahy Scale: Fair Sitting balance - Comments: CGA static sitting balance                                     Pertinent Vitals/Pain Pain Assessment: 0-10 Pain Score: 7  Pain Descriptors / Indicators: Sore;Grimacing;Guarding;Operative site guarding Pain Intervention(s): Limited  activity within patient's tolerance;Monitored during session;Repositioned (nurse reports pt is premedicated)    Home Living Family/patient expects to be discharged to:: Private residence Living Arrangements: Spouse/significant other Available Help at Discharge: Family;Available 24 hours/day Type of Home: House Home Access: Stairs to enter Entrance Stairs-Rails: Right (at garage) Entrance Stairs-Number of Steps: 2 at garage Home Layout: One level Home Equipment: Environmental consultant - 2 wheels (rollator)      Prior Function Level of Independence: Independent  (prior to surgeries)         Comments: working as a Nurse, adult        Extremity/Trunk Assessment   Upper Extremity Assessment Upper Extremity Assessment: Overall WFL for tasks assessed    Lower Extremity Assessment Lower Extremity Assessment: Generalized weakness       Communication   Communication: No difficulties  Cognition Arousal/Alertness: Awake/alert Behavior During Therapy: WFL for tasks assessed/performed Overall Cognitive Status: Within Functional Limits for tasks assessed                                        General Comments      Exercises     Assessment/Plan    PT Assessment Patient needs continued PT services  PT Problem List Decreased strength;Decreased balance;Pain;Cardiopulmonary status limiting activity;Obesity;Decreased activity tolerance;Decreased mobility       PT Treatment Interventions Functional mobility training;DME instruction;Balance training;Patient/family education;Neuromuscular re-education;Therapeutic activities;Stair training;Therapeutic exercise;Manual techniques;Gait training    PT Goals (Current goals can be found in the Care Plan section)  Acute Rehab PT Goals Patient Stated Goal: decreased pain PT Goal Formulation: With patient Time For Goal Achievement: 07/24/20 Potential to Achieve Goals: Good    Frequency Min 2X/week   Barriers to discharge        Co-evaluation               AM-PAC PT "6 Clicks" Mobility  Outcome Measure Help needed turning from your back to your side while in a flat bed without using bedrails?: A Lot Help needed moving from lying on your back to sitting on the side of a flat bed without using bedrails?: A Lot Help needed moving to and from a bed to a chair (including a wheelchair)?: Total Help needed standing up from a chair using your arms (e.g., wheelchair or bedside chair)?: Total Help needed to walk in hospital room?: Total Help needed climbing 3-5  steps with a railing? : Total 6 Click Score: 8    End of Session   Activity Tolerance: Patient limited by pain Patient left: in bed;with call bell/phone within reach;with family/visitor present;with bed alarm set Nurse Communication: Mobility status (O2 & HR) PT Visit Diagnosis: Muscle weakness (generalized) (M62.81);Difficulty in walking, not elsewhere classified (R26.2);Pain Pain - part of body:  (abdomen)    Time: 1410-1434 PT Time Calculation (min) (ACUTE ONLY): 24 min   Charges:   PT Evaluation $PT Eval Moderate Complexity: 1 Mod PT Treatments $Therapeutic Activity: 8-22 mins        Lavone Nian, PT, DPT 07/10/20, 4:24 PM   Waunita Schooner 07/10/2020, 4:20 PM

## 2020-07-10 NOTE — Progress Notes (Signed)
Pt transferred to RM 211 at this time. VSS prior to transfer. Report given to receiving RN.

## 2020-07-10 NOTE — Progress Notes (Signed)
[facility] Day(s): 2.   Post op day(s): 2 Days Post-Op.   Interval History: Patient seen and examined, no acute events or new complaints overnight. Patient reports remaining thirsty, and wanting something to drink.  We remove the NG tube, due to minimal output.  Pain seems to be reasonably controlled.  He remains hemodynamically stable.  Still getting IV fluids at 150 mL/h.   Vital signs in last 24 hours: [min-max] current  Temp:  [98.7 F (37.1 C)-99.1 F (37.3 C)] 98.9 F (37.2 C) (11/21 0800) Pulse Rate:  [95-112] 98 (11/21 1100) Resp:  [18-26] 19 (11/21 1100) BP: (101-137)/(50-74) 123/63 (11/21 1100) SpO2:  [90 %-95 %] 90 % (11/21 1100)     Height: 6' (182.9 cm) Weight: 117.9 kg BMI (Calculated): 35.25   Intake/Output last 2 shifts:  11/20 0701 - 11/21 0700 In: 50569.7 [P.O.:56642; I.V.:2224.1; IV Piggyback:204.2] Out: 2865 [Urine:2700; Emesis/NG output:100; Drains:65]  Urine yellow colored. Physical Exam:  Constitutional: alert, cooperative and no distress  Respiratory: breathing non-labored at rest  Cardiovascular: regular rate and sinus rhythm  Gastrointestinal: Expected tenderness, midline honeycomb dressing in place.  Improved bowel sounds.  Stoma discolored/dusky appearance, no significant output.  Integumentary: Warm, normal turgor, no remarkable edema.  Labs:  CBC Latest Ref Rng & Units 07/09/2020 07/08/2020 07/03/2020  WBC 4.0 - 10.5 K/uL 19.9(H) 41.9(H) 13.1(H)  Hemoglobin 13.0 - 17.0 g/dL 10.8(L) 12.0(L) 10.0(L)  Hematocrit 39 - 52 % 32.5(L) 36.1(L) 28.7(L)  Platelets 150 - 400 K/uL 237 399 237   CMP Latest Ref Rng & Units 07/09/2020 07/08/2020 07/03/2020  Glucose 70 - 99 mg/dL 124(H) 129(H) 106(H)  BUN 6 - 20 mg/dL 21(H) 23(H) 7  Creatinine 0.61 - 1.24 mg/dL 0.84 2.25(H) 0.66  Sodium 135 - 145 mmol/L 138 136 137  Potassium 3.5 - 5.1 mmol/L 3.9 3.7 3.2(L)  Chloride 98 - 111 mmol/L 107 98 105  CO2 22 - 32  mmol/L 23 24 22   Calcium 8.9 - 10.3 mg/dL 7.6(L) 8.5(L) 8.1(L)  Total Protein 6.5 - 8.1 g/dL 5.0(L) 5.8(L) -  Total Bilirubin 0.3 - 1.2 mg/dL 1.0 1.0 -  Alkaline Phos 38 - 126 U/L 45 53 -  AST 15 - 41 U/L 32 29 -  ALT 0 - 44 U/L 40 27 -     Imaging studies: No new pertinent imaging studies   Assessment/Plan:  29 y.o. male with postoperative anastomotic leak 2 Days Post-Op s/p Hartman's procedure.   Patient Active Problem List   Diagnosis Date Noted  . 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 Pneumoperitoneum 07/08/2020  . 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 Diverticulitis of colon with perforation 11/22/2019    -NG tube removed, cautioned patient regarding potential postoperative ileus and will allow sips.  -Continue antibiotics of meropenem.  -Transfer off unit today.   All of the above findings and recommendations were discussed with the patient, patient's family, and the medical team, and all of patient's and family's questions were answered to their expressed satisfaction.  -- Ronny Bacon, M.D., [facility] 07/10/2020

## 2020-07-10 NOTE — Progress Notes (Signed)
Dr. Christian Mate at bedside. He is aware of very little output from NGT overnight. Orders received to remove NGT and allow ice chips and sips of clears. PT also ordered. Dr. Christian Mate agrees that patient can move out of ICU today to med-surg.

## 2020-07-10 NOTE — Progress Notes (Signed)
Okay to hold coreg at this time per Dr. Lanney Gins.

## 2020-07-10 NOTE — Consult Note (Signed)
Pharmacy Antibiotic Note  Craig [name] is a 29 y.o. Male with recent colostomy takedown on 06/28/20 (d/c from hospital on 07/03/20) admitted 07/08/20 with septic shock in the setting of pneumoperitoneum due to rectal anastomosis. Pharmacy has been consulted for meropenem dosing. His renal function is significantly worse than his baseline level. He has a noted allergy to amoxicillin (rash)  Plan: Day 3 IV abx. Continue meropenem 1 gram IV every 8 hours  Height: 6' (182.9 cm) Weight: 117.9 kg (260 lb) IBW/kg (Calculated) : 77.6  Temp (24hrs), Avg:98.9 F (37.2 C), Min:98.7 F (37.1 C), Max:99.1 F (37.3 C)  Recent Labs  Lab 07/08/20 1020 07/08/20 1100 07/08/20 1231 07/08/20 2036 07/08/20 2226 07/09/20 0511 07/09/20 0731  WBC 41.9*  --   --   --   --  19.9*  --   CREATININE 2.25*  --   --   --   --   --  0.84  LATICACIDVEN  --  3.2* 3.2* 2.9* 2.6*  --   --     Estimated Creatinine Clearance: 168.9 mL/min (by C-G formula based on SCr of 0.84 mg/dL).    Allergies  Allergen Reactions  . Amoxicillin Rash    Antimicrobials this admission: gentamicin x 1 ciprofloxacin x 1 metronidazole x 1 11/19 meropenem >>   Microbiology results: 11/19 BCx: pending 11/19 SARS CoV-2: negative 11/19 influenza A/B: negative 11/19 MRSA PCR: pending  Thank you for allowing pharmacy to be a part of this patient's care.  Pernell Dupre, PharmD, BCPS Clinical Pharmacist 07/10/2020 11:56 AM

## 2020-07-10 NOTE — Progress Notes (Signed)
NAME:  Craig [name], MRN:  [id number], DOB:  1991/03/28, LOS: 2 ADMISSION DATE:  07/08/2020, CONSULTATION DATE:  07/08/2020 REFERRING MD:  Dr. Windell Moment, CHIEF COMPLAINT:  Abdominal pain, fever   Brief History   29 y.o. Male with recent Colostomy takedown on 06/28/20 (d/c from hospital on 07/03/20) admitted 07/08/20 with septic shock in the setting of pneumoperitoneum due to anastomosis leak requiring emergent Exploratory Laparotomy and ostomy creation.  History of present illness   Craig [name] is a 29 y.o. Male with a past medical history significant for diverticulitis with colon perforation in April 2021 with recent colostomy takedown on 06/28/2020 who presents to [facility] ED on 07/08/2020 due to abdominal pain, fever, and weakness. His symptoms began last night, and he was seen outpatient at his General Surgeon's office earlier this morning, and was found to be tachycardic, hypotensive, and again with lower abdominal pain.  He was sent directly to the ED for IV fluids, stat labs, and CT scan of the abdomin and pelvis.  ED Course Initial workup in the ED revealed WBC 41.9, hemoglobin 12.0, BUN 23, Creatinine 2.25, and lactic acid 3.2. He met sepsis criteria, therefore he received IV fluid resuscitation and broad spectrum antibiotics to cover for intra-abdominal pathology.  CT Abdomen & Pelvis showed pneumoperitoneum with free fluid concerning for anastomosis leak. He was taken emergently to the OR for Exploratory Laparotomy and ostomy creation.  Post procedure he returns to ICU, requiring small doses of Peripheral Neosynephrine.  PCCM is consulted for assistance in management of septic shock in the setting of pneumoperitoneum due to anastomosis leak requiring emergent Exploratory Laparotomy and ostomy creation.    Events   11/19: Presented to ED, required emergent exploratory laparotomy 11/20-  Patient asked to remove NG tube I have explained he still has active bile draining and  is unable to eat.  He want to have PO diet.  He shares analgesia is adequate. Vitals are stable.  11/21- Patient awake alert no distress, passing gas, has NG with minimal output on LIS. Wife at bedside reviewed care plan. Held coreg today due to borderline MAP.    Past Medical History  Diverticulitis Rupture of Bowel Hypertension Gout  dmitted to ICU post procedure, requiring Neosynephrine, PCCM consulted  Consults:  General Surgery (primary service) PCCM  Procedures:  11/19: Exploratory Laparotomy with ostomy creation  Significant Diagnostic Tests:  11/19: CT Abdomen/Pelvis>>1. Dehisced sigmoid colo-colostomy with free spillage of bowel contents into the lower abdomen and associated scattered small volume pneumoperitoneum. 2. Hepatic steatosis.  Micro Data:  11/19: SARS-CoV-2 PCR>>negative 11/19: Influenza PCR>>negatigve 11/19: Blood cultures x2>>  Antimicrobials:  Flagyl x1 dose 11/19 Gentamycin x1 dose 11/19 Ciprofloxacin x1 dose 11/19 Meropenem 11/19>>  Interim history/subjective:  -Returns to ICU post-procedure -On 30 mcg Neosynephrine, room air, NG tube to LIS -Afebrile -Denies abdominal pain, states he is comfortable s/p pain med administration   Objective   Blood pressure 122/62, pulse 97, temperature 98.9 F (37.2 C), temperature source Oral, resp. rate 20, height 6' (1.829 m), weight 117.9 kg, SpO2 91 %.        Intake/Output Summary (Last 24 hours) at 07/10/2020 1020 Last data filed at 07/10/2020 5916 Gross per 24 hour  Intake 59008.06 ml  Output 2840 ml  Net 56168.06 ml   Filed Weights   07/08/20 0951  Weight: 117.9 kg    Examination: General: Acutely ill appearing male, laying in bed, on room air, in NAD HENT: Atraumatic, normocephalic, neck supple, no JVD Lungs: Clear  to auscultation bilaterally, no wheezing or rales, even, nonlabored Cardiovascular: Regular rate and rhythm, s1s2, no M/R/G, 2+ distal puses Abdomen: Soft, tender, Hypoactive  BS, Midline abdominal incision with honeycomb dressing clean dry and intact, JP drain with serosanguinous drainage to RLQ, Colostomy to LLQ Extremities: Normal bulk and tone, no deformities, no edema Neuro: A&O x4, follows commands, no focal deficits, speech clear, pupils PERRL  Resolved Hospital Problem list   N/A  Assessment & Plan:   Septic shock-resolved  -Continuous cardiac monitoring -Maintain MAP >65 -Received IV Fluid resuscitation in ED -Continue IV Fluids @ 150 ml/hr -Vasopressors as needed to maintain MAP goal- weaned off neosynephrine -Trend lactic acid   Sepsis in setting of Pneumoperitoneum and anastomosis leak      -present on admission due to intrabdominal infection      - leukocytosis improved -Monitor fever curve -Trend WBC's & Procalcitonin -Follow cultures as above -Place on Meropenem for now -General Surgery following, appreciate input -S/p Emergent Laparotomy and Ostomy creation on 07/08/20   AKI -Monitor I&O's / urinary output -Follow BMP -Ensure adequate renal perfusion -Avoid nephrotoxic agents as able -Replace electrolytes as indicated -IV fluids   Best practice:  Diet: NPO Pain/Anxiety/Delirium protocol (if indicated): Morphine, Norco VAP protocol (if indicated): N/A DVT prophylaxis: Lovenox GI prophylaxis: Protonix IV Glucose control: N/A Mobility: As tolerated  Code Status: Full Code Family Communication: Updated pt at bedside 07/08/2020 Disposition: ICU  Labs   CBC: Recent Labs  Lab 07/08/20 1020 07/09/20 0511  WBC 41.9* 19.9*  HGB 12.0* 10.8*  HCT 36.1* 32.5*  MCV 97.6 97.3  PLT 399 202    Basic Metabolic Panel: Recent Labs  Lab 07/08/20 1020 07/09/20 0731  NA 136 138  K 3.7 3.9  CL 98 107  CO2 24 23  GLUCOSE 129* 124*  BUN 23* 21*  CREATININE 2.25* 0.84  CALCIUM 8.5* 7.6*  MG  --  1.8  PHOS  --  3.1   GFR: Estimated Creatinine Clearance: 168.9 mL/min (by C-G formula based on SCr of 0.84 mg/dL). Recent  Labs  Lab 07/08/20 1020 07/08/20 1100 07/08/20 1231 07/08/20 2036 07/08/20 2226 07/09/20 0511 07/09/20 0731 07/10/20 0611  PROCALCITON  --   --   --  23.98  --   --  20.73 8.89  WBC 41.9*  --   --   --   --  19.9*  --   --   LATICACIDVEN  --  3.2* 3.2* 2.9* 2.6*  --   --   --     Liver Function Tests: Recent Labs  Lab 07/08/20 1020 07/09/20 0731  AST 29 32  ALT 27 40  ALKPHOS 53 45  BILITOT 1.0 1.0  PROT 5.8* 5.0*  ALBUMIN 3.1* 2.3*   Recent Labs  Lab 07/08/20 1020  LIPASE 22   No results for input(s): AMMONIA in the last 168 hours.  ABG No results found for: PHART, PCO2ART, PO2ART, HCO3, TCO2, ACIDBASEDEF, O2SAT   Coagulation Profile: No results for input(s): INR, PROTIME in the last 168 hours.  Cardiac Enzymes: No results for input(s): CKTOTAL, CKMB, CKMBINDEX, TROPONINI in the last 168 hours.  HbA1C: No results found for: HGBA1C  CBG: Recent Labs  Lab 07/08/20 1852  GLUCAP 134*    Review of Systems:   Positives in BOLD: Pt currently denies all complaints Gen: Denies fever, chills, weight change, fatigue, night sweats HEENT: Denies blurred vision, double vision, hearing loss, tinnitus, sinus congestion, rhinorrhea, sore throat, neck stiffness, dysphagia, +nasal discomfort  from NGT PULM: Denies shortness of breath, cough, sputum production, hemoptysis, wheezing CV: Denies chest pain, edema, orthopnea, paroxysmal nocturnal dyspnea, palpitations GI: reports mild abdominal pain, Denies nausea, vomiting, diarrhea, hematochezia, melena, constipation, GU: Denies dysuria, hematuria, polyuria, oliguria, urethral discharge Endocrine: Denies hot or cold intolerance, polyuria, polyphagia or appetite change Derm: Denies rash, dry skin, scaling or peeling skin change Heme: Denies easy bruising, bleeding, bleeding gums Neuro: Denies headache, numbness, weakness, slurred speech, loss of memory or consciousness.   Past Medical History  He,  has a past medical  history of Gout, Hypertension, and Rupture of bowel ([place]).   Surgical History    Past Surgical History:  Procedure Laterality Date  . COLONOSCOPY WITH PROPOFOL N/A 05/18/2020   Procedure: COLONOSCOPY WITH PROPOFOL;  Surgeon: Benjamine Sprague, DO;  Location: [facility] ENDOSCOPY;  Service: General;  Laterality: N/A;  . COLOSTOMY N/A 11/22/2019   Procedure: COLOSTOMY;  Surgeon: Herbert Pun, MD;  Location: [facility] ORS;  Service: General;  Laterality: N/A;  . LAPAROTOMY N/A 11/22/2019   Procedure: EXPLORATORY LAPAROTOMY;  Surgeon: Herbert Pun, MD;  Location: [facility] ORS;  Service: General;  Laterality: N/A;  . LAPAROTOMY N/A 07/08/2020   Procedure: EXPLORATORY LAPAROTOMY;  Surgeon: Herbert Pun, MD;  Location: [facility] ORS;  Service: General;  Laterality: N/A;  . PARTIAL COLECTOMY N/A 11/22/2019   Procedure: PARTIAL COLECTOMY;  Surgeon: Herbert Pun, MD;  Location: [facility] ORS;  Service: General;  Laterality: N/A;  . XI ROBOTIC ASSISTED COLOSTOMY TAKEDOWN N/A 06/28/2020   Procedure: XI ROBOTIC ASSISTED COLOSTOMY TAKEDOWN CONVERTED TO OPEN PROCEDURE;  Surgeon: Herbert Pun, MD;  Location: [facility] ORS;  Service: General;  Laterality: N/A;     Social History   reports that he has never smoked. His smokeless tobacco use includes chew. He reports current alcohol use. He reports that he does not use drugs.   Family History   His family history includes Healthy in his father and mother.   Allergies Allergies  Allergen Reactions  . Amoxicillin Rash     Home Medications  Prior to Admission medications   Medication Sig Start Date End Date Taking? Authorizing Provider  allopurinol (ZYLOPRIM) 300 MG tablet Take 300 mg by mouth daily. 07/04/20  Yes [provider]  carvedilol (COREG) 12.5 MG tablet Take 12.5 mg by mouth daily.    Yes [provider]  HYDROcodone-acetaminophen (NORCO/VICODIN) 5-325 MG tablet Take 1 tablet by mouth every 4 (four) hours as needed for  moderate pain. 07/03/20  Yes Herbert Pun, MD  ondansetron (ZOFRAN-ODT) 4 MG disintegrating tablet Take 1 tablet (4 mg total) by mouth every 6 (six) hours as needed for nausea. 07/03/20  Yes Herbert Pun, MD  spironolactone (ALDACTONE) 25 MG tablet Take 25 mg by mouth daily.  05/12/20  Yes [provider]     Critical care time: 33 minutes     Critical care provider statement:    Critical care time (minutes):  33   Critical care time was exclusive of:  Separately billable procedures and  treating other patients   Critical care was necessary to treat or prevent imminent or  life-threatening deterioration of the following conditions:  sepsis, diverticultits with pneumoperitoneum and leakage of anastamosis.    Critical care was time spent personally by me on the following  activities:  Development of treatment plan with patient or surrogate,  discussions with consultants, evaluation of patient's response to  treatment, examination of patient, obtaining history from patient or  surrogate, ordering and performing treatments and interventions, ordering  and review of laboratory studies and re-evaluation of patient's condition   I assumed direction of critical care for this patient from another  provider in my specialty: no      Ottie Glazier, M.D.  [facility] [place]

## 2020-07-11 LAB — CBC
HCT: 30.2 % — ABNORMAL LOW (ref 39.0–52.0)
Hemoglobin: 9.9 g/dL — ABNORMAL LOW (ref 13.0–17.0)
MCH: 32.1 pg (ref 26.0–34.0)
MCHC: 32.8 g/dL (ref 30.0–36.0)
MCV: 98.1 fL (ref 80.0–100.0)
Platelets: 195 10*3/uL (ref 150–400)
RBC: 3.08 MIL/uL — ABNORMAL LOW (ref 4.22–5.81)
RDW: 13.4 % (ref 11.5–15.5)
WBC: 11.8 10*3/uL — ABNORMAL HIGH (ref 4.0–10.5)
nRBC: 0 % (ref 0.0–0.2)

## 2020-07-11 LAB — BASIC METABOLIC PANEL
Anion gap: 12 (ref 5–15)
BUN: 8 mg/dL (ref 6–20)
CO2: 29 mmol/L (ref 22–32)
Calcium: 8.4 mg/dL — ABNORMAL LOW (ref 8.9–10.3)
Chloride: 95 mmol/L — ABNORMAL LOW (ref 98–111)
Creatinine, Ser: 0.55 mg/dL — ABNORMAL LOW (ref 0.61–1.24)
GFR, Estimated: >60 mL/min (ref >60)
Glucose, Bld: 96 mg/dL (ref 70–99)
Potassium: 3.2 mmol/L — ABNORMAL LOW (ref 3.5–5.1)
Sodium: 136 mmol/L (ref 135–145)

## 2020-07-11 MED ORDER — ALLOPURINOL 300 MG PO TABS
300.0000 mg | ORAL_TABLET | Freq: Every morning | ORAL | Status: DC
  Administered 2020-07-12 – 2020-07-14 (×3): 300 mg via ORAL
  Filled 2020-07-11 (×2): qty 1
  Filled 2020-07-11: qty 3
  Filled 2020-07-11: qty 1
  Filled 2020-07-11 (×2): qty 3

## 2020-07-11 MED ORDER — KCL IN DEXTROSE-NACL 40-5-0.45 MEQ/L-%-% IV SOLN
INTRAVENOUS | Status: DC
  Filled 2020-07-11 (×4): qty 1000

## 2020-07-11 MED ORDER — ALLOPURINOL 100 MG PO TABS: 300.0000 mg | ORAL_TABLET | Freq: Every morning | ORAL | Status: DC

## 2020-07-11 MED ORDER — CARVEDILOL 12.5 MG PO TABS
12.5000 mg | ORAL_TABLET | Freq: Two times a day (BID) | ORAL | Status: DC
  Administered 2020-07-11 – 2020-07-14 (×6): 12.5 mg via ORAL
  Filled 2020-07-11 (×6): qty 1

## 2020-07-11 MED ORDER — PANTOPRAZOLE SODIUM 40 MG PO TBEC
40.0000 mg | DELAYED_RELEASE_TABLET | Freq: Every day | ORAL | Status: DC
  Administered 2020-07-11 – 2020-07-13 (×3): 40 mg via ORAL
  Filled 2020-07-11 (×3): qty 1

## 2020-07-11 NOTE — Progress Notes (Signed)
Subjective:  CC: Craig [name] is a 29 y.o. 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 Transferred out of ICU, NG pulled and started on clear liquid diet.  ROS:  General: Denies weight loss, weight gain, fatigue, fevers, chills, and night sweats. Heart: Denies chest pain, palpitations, racing heart, irregular heartbeat, leg pain or swelling, and decreased activity tolerance. Respiratory: Denies breathing difficulty, shortness of breath, wheezing, cough, and sputum. GI: Denies change in appetite, heartburn, nausea, vomiting, constipation, diarrhea, and blood in stool. GU: Denies difficulty urinating, pain with urinating, urgency, frequency, blood in urine.   Objective:   Temp:  [98.3 F (36.8 C)-99.6 F (37.6 C)] 99.6 F (37.6 C) (11/22 0801) Pulse Rate:  [90-107] 97 (11/22 0801) Resp:  [10-25] 15 (11/22 0801) BP: (108-139)/(57-75) 124/66 (11/22 0801) SpO2:  [90 %-95 %] 93 % (11/22 0801)     Height: 6' (182.9 cm) Weight: 117.9 kg BMI (Calculated): 35.25   Intake/Output this shift:   Intake/Output Summary (Last 24 hours) at 07/11/2020 0947 Last data filed at 07/11/2020 0757 Gross per 24 hour  Intake 1972.14 ml  Output 5135 ml  Net -3162.86 ml    Constitutional :  alert, cooperative, appears stated age and no distress  Respiratory:  clear to auscultation bilaterally  Cardiovascular:  regular rate and rhythm  Gastrointestinal: Soft, no guarding, appropriate tenderness to palpation along midline incision.  JP with serous fluid.  Ostomy mucosa dark but patches of healthy pink noted, with gas within the ostomy bag itself.   Skin: Cool and moist.  Midline skin incision staples intact, visible healthy granulation tissue in between.  Serosanguineous drainage.  Psychiatric: Normal affect, non-agitated, not confused       LABS:  CMP Latest Ref Rng & Units 07/11/2020  07/09/2020 07/08/2020  Glucose 70 - 99 mg/dL 96 124(H) 129(H)  BUN 6 - 20 mg/dL 8 21(H) 23(H)  Creatinine 0.61 - 1.24 mg/dL 0.55(L) 0.84 2.25(H)  Sodium 135 - 145 mmol/L 136 138 136  Potassium 3.5 - 5.1 mmol/L 3.2(L) 3.9 3.7  Chloride 98 - 111 mmol/L 95(L) 107 98  CO2 22 - 32 mmol/L 29 23 24   Calcium 8.9 - 10.3 mg/dL 8.4(L) 7.6(L) 8.5(L)  Total Protein 6.5 - 8.1 g/dL - 5.0(L) 5.8(L)  Total Bilirubin 0.3 - 1.2 mg/dL - 1.0 1.0  Alkaline Phos 38 - 126 U/L - 45 53  AST 15 - 41 U/L - 32 29  ALT 0 - 44 U/L - 40 27   CBC Latest Ref Rng & Units 07/11/2020 07/09/2020 07/08/2020  WBC 4.0 - 10.5 K/uL 11.8(H) 19.9(H) 41.9(H)  Hemoglobin 13.0 - 17.0 g/dL 9.9(L) 10.8(L) 12.0(L)  Hematocrit 39 - 52 % 30.2(L) 32.5(L) 36.1(L)  Platelets 150 - 400 K/uL 195 237 399    RADS: n/a Assessment:    s/p ex-lap, colon resection, 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  Doing well overall.  Continue to monitor pain, ambulate hallways, await stool output from colostomy prior to advancing diet.  Will discontinue Foley, which to maintenance IV fluids in the meantime.  We will continue IV antibiotics due to the intra-abdominal infection upon presentation.

## 2020-07-11 NOTE — TOC Initial Note (Signed)
Transition of Care [facility]) - Initial/Assessment Note    Patient Details  Name: Craig [name] MRN: [id number] Date of Birth: 19-May-1991  Transition of Care [facility]) CM/SW Contact:    Candie Chroman, LCSW Phone Number: 07/11/2020, 9:45 AM  Clinical Narrative:  CSW met with patient. No supports at bedside. CSW introduced role and explained that PT recommendations would be discussed. Patient is not interested in home health at this time. He feels he will improve enough by discharge to not need it. CSW discussed potential for outpatient PT. Patient unsure if he wants this. CSW left list of local outpatient PT offices in case he changes his mind. He will have his wife review it once she returns. No further concerns. CSW encouraged patient to contact CSW as needed. CSW will continue to follow patient for support and facilitate return home when stable.                Expected Discharge Plan: Home/Self Care Barriers to Discharge: Continued Medical Work up   Patient Goals and CMS Choice     Choice offered to / list presented to : NA  Expected Discharge Plan and Services Expected Discharge Plan: Home/Self Care     Post Acute Care Choice: NA Living arrangements for the past 2 months: Single Family Home                                      Prior Living Arrangements/Services Living arrangements for the past 2 months: Single Family Home Lives with:: Spouse Patient language and need for interpreter reviewed:: Yes Do you feel safe going back to the place where you live?: Yes      Need for Family Participation in Patient Care: Yes (Comment) Care giver support system in place?: Yes (comment) Current home services: DME Criminal Activity/Legal Involvement Pertinent to Current Situation/Hospitalization: No - Comment as needed  Activities of Daily Living Home Assistive Devices/Equipment: None ADL Screening (condition at time of admission) Patient's cognitive ability adequate to  safely complete daily activities?: Yes Is the patient deaf or have difficulty hearing?: No Does the patient have difficulty seeing, even when wearing glasses/contacts?: No Does the patient have difficulty concentrating, remembering, or making decisions?: No Patient able to express need for assistance with ADLs?: Yes Does the patient have difficulty dressing or bathing?: No Independently performs ADLs?: Yes (appropriate for developmental age) Does the patient have difficulty walking or climbing stairs?: No Weakness of Legs: None Weakness of Arms/Hands: None  Permission Sought/Granted                  Emotional Assessment Appearance:: Appears stated age Attitude/Demeanor/Rapport: Engaged, Gracious Affect (typically observed): Appropriate, Calm, Pleasant Orientation: : Oriented to Self, Oriented to Place, Oriented to  Time, Oriented to Situation Alcohol / Substance Use: Not Applicable Psych Involvement: No (comment)  Admission diagnosis:  Leak of anastomosis between gastrointestinal structures [K91.89] Pneumoperitoneum [K66.8] Patient Active Problem List   Diagnosis Date Noted  . 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 Pneumoperitoneum 07/08/2020  . 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 Diverticulitis of colon with perforation 11/22/2019   PCP:  [name], MD Pharmacy:   [id number] - [place], [place] [address] [address] Phone: [phone] Fax: [phone]     Social Determinants of Health (SDOH) Interventions    Readmission Risk Interventions No  flowsheet data found.

## 2020-07-12 LAB — SURGICAL PATHOLOGY

## 2020-07-12 MED ORDER — LOPERAMIDE HCL 2 MG PO CAPS: 2.0000 mg | ORAL_CAPSULE | ORAL | Status: DC | PRN

## 2020-07-12 MED ORDER — NICOTINE 21 MG/24HR TD PT24
21.0000 mg | MEDICATED_PATCH | Freq: Every day | TRANSDERMAL | Status: DC
  Administered 2020-07-12: 21 mg via TRANSDERMAL
  Filled 2020-07-12 (×2): qty 1

## 2020-07-12 MED ORDER — SODIUM CHLORIDE 0.9 % IV SOLN
INTRAVENOUS | Status: DC | PRN
  Administered 2020-07-12 (×2): 250 mL via INTRAVENOUS

## 2020-07-12 MED ORDER — COLCHICINE 0.6 MG PO TABS
0.6000 mg | ORAL_TABLET | Freq: Once | ORAL | Status: AC
  Administered 2020-07-12: 0.6 mg via ORAL
  Filled 2020-07-12: qty 1

## 2020-07-12 NOTE — Progress Notes (Signed)
Physical Therapy Treatment Patient Details Name: Craig [name] MRN: [id number] DOB: May 17, 1991 Today's Date: 07/12/2020    History of Present Illness Pt is a 29 y/o M with colostomy takedown on 06/28/20 & d/c on 07/03/20. Pt doing well at home until the day before where pt felt weak with lower abdominal pain & fever. Pt evaluated in outpatient office & found to have tachycardia, hypotension & ongoing pain. Pt admitted on 07/08/20 & CT revealed dehiscence with bowel content leaking into abdomen. Pt taken to OR for re-opening of recent laparotomy & partial colectomy with colostomy.    PT Comments    Pt seen this am, willing to attempt mobility with encouragement.  Pt's caregiver from home in room and willing to assist with patient.  Pt required increased time, HOB raised with side rail and ModA from caregiver to transition to EOB.  Sitting EOB x 6 minutes with B UE support required and B feet on floor.  Pt expressed c/o abdominal discomfort at 8/10 and requested to return to supine.  "I haven't had anything for pain in a while".  Pt positioned to comfort in bed, nursing notified of pain med request.  Bed lowered, call bell within reach, caregiver in room.     Follow Up Recommendations  Home health PT;Supervision/Assistance - 24 hour;Supervision for mobility/OOB     Equipment Recommendations  None recommended by PT    Recommendations for Other Services       Precautions / Restrictions Precautions Precautions: Fall Precaution Comments: log roll for comfort for abdominal incisions    Mobility  Bed Mobility Overal bed mobility: Needs Assistance Bed Mobility: Sit to Supine;Rolling;Sidelying to Sit Rolling: Min assist Sidelying to sit: Min guard;HOB elevated   Sit to supine: Mod assist;HOB elevated   General bed mobility comments: HOB elevated, pt prefers to hold to wife's stable hand, reporting that's how they've been doing it at home; +2 total assist to scoot to [facility] once  supine  Transfers Overall transfer level: Needs assistance               General transfer comment: Limited due to c/o pain  Ambulation/Gait                 Stairs             Wheelchair Mobility    Modified Rankin (Stroke Patients Only)       Balance                                            Cognition Arousal/Alertness: Awake/alert Behavior During Therapy: WFL for tasks assessed/performed Overall Cognitive Status: Within Functional Limits for tasks assessed                                 General Comments: Caregiver present to assist with mobility      Exercises      General Comments        Pertinent Vitals/Pain Pain Assessment: 0-10 Pain Score: 8  Pain Location: Abdomen with sitting EOB Pain Descriptors / Indicators: Pressure;Grimacing;Aching Pain Intervention(s): Limited activity within patient's tolerance    Home Living                      Prior Function  PT Goals (current goals can now be found in the care plan section) Acute Rehab PT Goals Patient Stated Goal: decreased pain, go home Progress towards PT goals: Progressing toward goals    Frequency    Min 2X/week      PT Plan Current plan remains appropriate    Co-evaluation              AM-PAC PT "6 Clicks" Mobility   Outcome Measure  Help needed turning from your back to your side while in a flat bed without using bedrails?: A Lot Help needed moving from lying on your back to sitting on the side of a flat bed without using bedrails?: A Lot Help needed moving to and from a bed to a chair (including a wheelchair)?: Total Help needed standing up from a chair using your arms (e.g., wheelchair or bedside chair)?: Total Help needed to walk in hospital room?: Total Help needed climbing 3-5 steps with a railing? : Total 6 Click Score: 8    End of Session         PT Visit Diagnosis: Muscle weakness  (generalized) (M62.81);Difficulty in walking, not elsewhere classified (R26.2);Pain Pain - part of body:  (Abdomen region)     Time: 9432-7614 PT Time Calculation (min) (ACUTE ONLY): 15 min  Charges:  $Therapeutic Activity: 8-22 mins                     Mikel Cella, PTA   [name] 07/12/2020, 12:12 PM

## 2020-07-13 ENCOUNTER — Inpatient Hospital Stay: Payer: No Typology Code available for payment source

## 2020-07-13 LAB — CBC
HCT: 32.6 % — ABNORMAL LOW (ref 39.0–52.0)
Hemoglobin: 10.7 g/dL — ABNORMAL LOW (ref 13.0–17.0)
MCH: 31.1 pg (ref 26.0–34.0)
MCHC: 32.8 g/dL (ref 30.0–36.0)
MCV: 94.8 fL (ref 80.0–100.0)
Platelets: 247 10*3/uL (ref 150–400)
RBC: 3.44 MIL/uL — ABNORMAL LOW (ref 4.22–5.81)
RDW: 13.5 % (ref 11.5–15.5)
WBC: 14.4 10*3/uL — ABNORMAL HIGH (ref 4.0–10.5)
nRBC: 0 % (ref 0.0–0.2)

## 2020-07-13 LAB — CULTURE, BLOOD (ROUTINE X 2)
Culture: NO GROWTH
Culture: NO GROWTH
Special Requests: ADEQUATE

## 2020-07-13 IMAGING — DX DG CHEST 1V
1 series · 1 of 1 positions shown · non-contrast
Comparison: [DATE]

CLINICAL DATA: Cough.  Hypertension.

EXAM:
CHEST  1 VIEW

[chest ap]
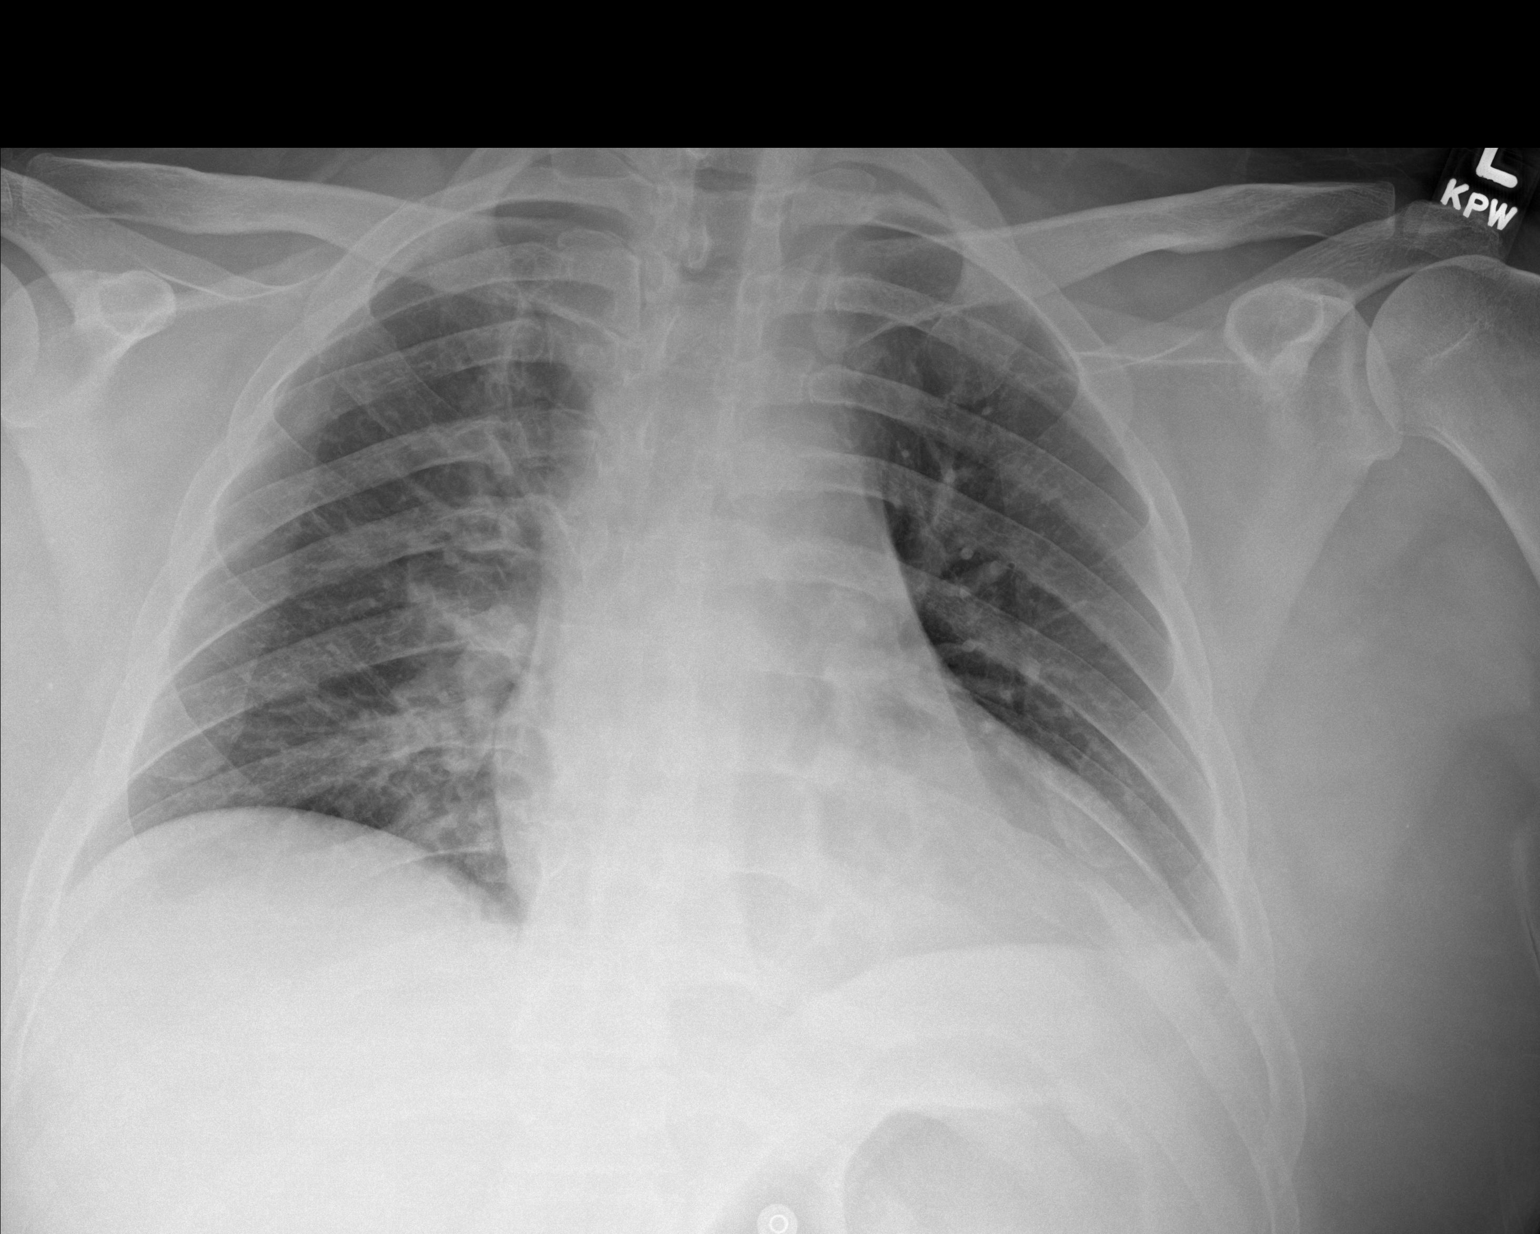

[1 of 1 positions shown; findings below may reference images not displayed]

FINDINGS: Slight scarring medial right upper lobe. No edema or airspace
opacity. Heart is upper normal in size with pulmonary vascularity
normal. No adenopathy. No bone lesions.
IMPRESSION: Mild scarring right upper lobe medially. No edema or airspace
opacity. Heart upper normal in size.

## 2020-07-13 MED ORDER — METHYLPREDNISOLONE SODIUM SUCC 40 MG IJ SOLR
40.0000 mg | Freq: Once | INTRAMUSCULAR | Status: AC
  Administered 2020-07-13: 40 mg via INTRAVENOUS
  Filled 2020-07-13: qty 1

## 2020-07-13 NOTE — Progress Notes (Addendum)
Subjective:  CC: Craig [name] is a 29 y.o. male  Hospital stay day 4 ex-lap, colon resection, end colostomy creation secondary to anastomosis leak  HPI: No acute issues overnight. Thinks gout is flaring up.  Tolerated clears with stool in bag   ROS:  General: Denies weight loss, weight gain, fatigue, fevers, chills, and night sweats. Heart: Denies chest pain, palpitations, racing heart, irregular heartbeat, leg pain or swelling, and decreased activity tolerance. Respiratory: Denies breathing difficulty, shortness of breath, wheezing, cough, and sputum. GI: Denies change in appetite, heartburn, nausea, vomiting, constipation, diarrhea, and blood in stool. GU: Denies difficulty urinating, pain with urinating, urgency, frequency, blood in urine.   Objective:   VSS     Height: 6' (182.9 cm) Weight: 117.9 kg BMI (Calculated): 35.25    Constitutional :  alert, cooperative, appears stated age and no distress  Respiratory:  clear to auscultation bilaterally  Cardiovascular:  regular rate and rhythm  Gastrointestinal: Soft, no guarding, appropriate tenderness to palpation along midline incision.  JP with serous fluid.  Ostomy mucosa dark but patches of healthy pink noted, with gas and stool within the ostomy bag itself.   Skin: Cool and moist.  Midline skin incision staples intact, visible healthy granulation tissue in between.  Serosanguineous drainage.  Psychiatric: Normal affect, non-agitated, not confused       LABS:  Improving  RADS: n/a Assessment:    s/p ex-lap, colon resection, 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  Doing well overall.  Discussed gout flare with pt rheumatologist, recommended colchicine vs IV steroid x1.  Will try colchicine first due to possible delayed healing with steroid use.  Diet advanced to soft.  Continue to monitor for pain control.  Home PT ordered

## 2020-07-13 NOTE — Progress Notes (Signed)
PT Cancellation Note  Patient Details Name: Craig [name] MRN: [id number] DOB: 21-May-1991   Cancelled Treatment:     Pt febrile this am, declined activity.   [name] 07/13/2020, 3:56 PM

## 2020-07-13 NOTE — Progress Notes (Signed)
Subjective:  CC: Craig [name] is a 29 y.o. male  Hospital stay day 5, 5 Days Post-Op ex-lap, colon resection, end colostomy creation secondary to anastomosis leak  HPI: Temp 100.2 noted. Mentioned some increased coughing past few days. Doing ok with soft diet.  No worsening of gout   ROS:  General: Denies weight loss, weight gain, fatigue, fevers, chills, and night sweats. Heart: Denies chest pain, palpitations, racing heart, irregular heartbeat, leg pain or swelling, and decreased activity tolerance. Respiratory: Denies breathing difficulty, shortness of breath, wheezing, cough, and sputum. GI: Denies change in appetite, heartburn, nausea, vomiting, constipation, diarrhea, and blood in stool. GU: Denies difficulty urinating, pain with urinating, urgency, frequency, blood in urine.   Objective:   Temp:  [98.1 F (36.7 C)-100.2 F (37.9 C)] 99.5 F (37.5 C) (11/24 0600) Pulse Rate:  [87-106] 106 (11/24 0442) Resp:  [16-20] 20 (11/24 0442) BP: (114-138)/(63-93) 127/69 (11/24 0442) SpO2:  [93 %-95 %] 95 % (11/24 0442)     Height: 6' (182.9 cm) Weight: 117.9 kg BMI (Calculated): 35.25   Intake/Output this shift:   Intake/Output Summary (Last 24 hours) at 07/13/2020 9381 Last data filed at 07/13/2020 0600 Gross per 24 hour  Intake 460 ml  Output 960 ml  Net -500 ml    Constitutional :  alert, cooperative, appears stated age and no distress  Respiratory:  clear to auscultation bilaterally  Cardiovascular:  regular rate and rhythm  Gastrointestinal: Soft, no guarding, appropriate tenderness to palpation along midline incision.  JP with serous fluid.  Ostomy mucosa dark but patches of healthy pink noted, with gas within the ostomy bag itself.   Skin: Cool and moist.  Midline skin incision staples intact, visible healthy granulation tissue in between.  Some increased erythema, superior aspect  Serosanguineous drainage.  Psychiatric: Normal affect, non-agitated, not confused        LABS:  CMP Latest Ref Rng & Units 07/11/2020 07/09/2020 07/08/2020  Glucose 70 - 99 mg/dL 96 124(H) 129(H)  BUN 6 - 20 mg/dL 8 21(H) 23(H)  Creatinine 0.61 - 1.24 mg/dL 0.55(L) 0.84 2.25(H)  Sodium 135 - 145 mmol/L 136 138 136  Potassium 3.5 - 5.1 mmol/L 3.2(L) 3.9 3.7  Chloride 98 - 111 mmol/L 95(L) 107 98  CO2 22 - 32 mmol/L 29 23 24   Calcium 8.9 - 10.3 mg/dL 8.4(L) 7.6(L) 8.5(L)  Total Protein 6.5 - 8.1 g/dL - 5.0(L) 5.8(L)  Total Bilirubin 0.3 - 1.2 mg/dL - 1.0 1.0  Alkaline Phos 38 - 126 U/L - 45 53  AST 15 - 41 U/L - 32 29  ALT 0 - 44 U/L - 40 27   CBC Latest Ref Rng & Units 07/11/2020 07/09/2020 07/08/2020  WBC 4.0 - 10.5 K/uL 11.8(H) 19.9(H) 41.9(H)  Hemoglobin 13.0 - 17.0 g/dL 9.9(L) 10.8(L) 12.0(L)  Hematocrit 39 - 52 % 30.2(L) 32.5(L) 36.1(L)  Platelets 150 - 400 K/uL 195 237 399    RADS: n/a Assessment:    s/p ex-lap, colon resection, 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  Doing well overall.  Colchicine given yesterday, seems to be preventing worsening of gout symptoms.  We will continue to monitor.  Continue PT.  Repeat labs and chest x-ray for his cough.  We will continue IV antibiotics due to the intra-abdominal infection upon presentation.  Few midline staples at the superior aspect removed around the erythematous area, no obvious purulent fluid drainage was noted.  Area packed with 4 x 4 gauze and then entire midline covered  with ABD pad by myself.  We will continue to monitor.

## 2020-07-14 LAB — CBC WITH DIFFERENTIAL/PLATELET
Abs Immature Granulocytes: 0.15 10*3/uL — ABNORMAL HIGH (ref 0.00–0.07)
Basophils Absolute: 0 10*3/uL (ref 0.0–0.1)
Basophils Relative: 0 %
Eosinophils Absolute: 0 10*3/uL (ref 0.0–0.5)
Eosinophils Relative: 0 %
HCT: 32.9 % — ABNORMAL LOW (ref 39.0–52.0)
Hemoglobin: 10.8 g/dL — ABNORMAL LOW (ref 13.0–17.0)
Immature Granulocytes: 1 %
Lymphocytes Relative: 10 %
Lymphs Abs: 1.2 10*3/uL (ref 0.7–4.0)
MCH: 30.8 pg (ref 26.0–34.0)
MCHC: 32.8 g/dL (ref 30.0–36.0)
MCV: 93.7 fL (ref 80.0–100.0)
Monocytes Absolute: 0.9 10*3/uL (ref 0.1–1.0)
Monocytes Relative: 7 %
Neutro Abs: 9.6 10*3/uL — ABNORMAL HIGH (ref 1.7–7.7)
Neutrophils Relative %: 82 %
Platelets: 298 10*3/uL (ref 150–400)
RBC: 3.51 MIL/uL — ABNORMAL LOW (ref 4.22–5.81)
RDW: 13.2 % (ref 11.5–15.5)
WBC: 11.8 10*3/uL — ABNORMAL HIGH (ref 4.0–10.5)
nRBC: 0 % (ref 0.0–0.2)

## 2020-07-14 LAB — BASIC METABOLIC PANEL
Anion gap: 11 (ref 5–15)
BUN: 11 mg/dL (ref 6–20)
CO2: 29 mmol/L (ref 22–32)
Calcium: 8.6 mg/dL — ABNORMAL LOW (ref 8.9–10.3)
Chloride: 93 mmol/L — ABNORMAL LOW (ref 98–111)
Creatinine, Ser: 0.5 mg/dL — ABNORMAL LOW (ref 0.61–1.24)
GFR, Estimated: >60 mL/min (ref >60)
Glucose, Bld: 122 mg/dL — ABNORMAL HIGH (ref 70–99)
Potassium: 4.4 mmol/L (ref 3.5–5.1)
Sodium: 133 mmol/L — ABNORMAL LOW (ref 135–145)

## 2020-07-14 MED ORDER — DOCUSATE SODIUM 100 MG PO CAPS: 100.0000 mg | ORAL_CAPSULE | Freq: Two times a day (BID) | ORAL | 0 refills | Status: AC | PRN

## 2020-07-14 MED ORDER — HYDROCODONE-ACETAMINOPHEN 5-325 MG PO TABS: 1.0000 | ORAL_TABLET | Freq: Four times a day (QID) | ORAL | 0 refills | Status: AC | PRN

## 2020-07-14 MED ORDER — METRONIDAZOLE 500 MG PO TABS: 500.0000 mg | ORAL_TABLET | Freq: Three times a day (TID) | ORAL | 0 refills | Status: AC

## 2020-07-14 MED ORDER — CIPROFLOXACIN HCL 500 MG PO TABS: 500.0000 mg | ORAL_TABLET | Freq: Two times a day (BID) | ORAL | 0 refills | Status: AC

## 2020-07-14 MED ORDER — ACETAMINOPHEN 325 MG PO TABS: 650.0000 mg | ORAL_TABLET | Freq: Three times a day (TID) | ORAL | 0 refills | Status: DC | PRN

## 2020-07-14 NOTE — Discharge Instructions (Signed)
Colon surgery, Care After This sheet gives you information about how to care for yourself after your procedure. Your health care provider may also give you more specific instructions. If you have problems or questions, contact your health care provider. What can I expect after the procedure? After your procedure, it is common to have the following:  Pain in your abdomen, especially in the incision areas. You will be given medicine to control the pain.  Tiredness. This is a normal part of the recovery process. Your energy level will return to normal over the next several weeks.  Changes in your bowel movements, such as constipation or needing to go more often. Talk with your health care provider about how to manage this. Follow these instructions at home: Medicines   tylenol as needed for discomfort.     Use narcotics, if prescribed, only when tylenol is not enough to control pain.   325-650mg  every 8hrs to max of 4000mg /24hrs (including the 325mg  in every norco dose) for the tylenol.     Do not drive or use heavy machinery while taking prescription pain medicine.  Do not drink alcohol while taking prescription pain medicine.  If you were prescribed an antibiotic medicine, use it as told by your health care provider. Do not stop using the antibiotic even if you start to feel better. Incision care     Follow instructions from your health care provider about how to take care of your incision areas. Make sure you: ? Keep your incisions clean and dry. ? Wash your hands with soap and water before and after  changing your bandage (dressing). If soap and water are not available, use hand sanitizer. ? Change your dressing as told by your health care provider. ? Leave stitches (sutures), skin glue, or adhesive strips in place. These skin closures may need to stay in place for 2 weeks or longer. If adhesive strip edges start to loosen and curl up, you may trim the loose edges. Do not remove  adhesive strips completely unless your health care provider tells you to do that.  Do not wear tight clothing over the incisions. Tight clothing may rub and irritate the incision areas, which may cause the incisions to open.   OK TO SHOWER.    RECORD JP DRAIN OUTPUT DAILY  Check your incision area every day for signs of infection. Check for: ? More redness, swelling, or pain. ? More fluid or blood. ? Warmth. ? Pus or a bad smell. Activity  Avoid lifting anything that is heavier than 10 lb (4.5 kg) for 2 weeks or until your health care provider says it is okay.  You may resume normal activities as told by your health care provider. Ask your health care provider what activities are safe for you.  Take rest breaks during the day as needed. Eating and drinking  Follow instructions from your health care provider about what you can eat after surgery.  To prevent or treat constipation while you are taking prescription pain medicine, your health care provider may recommend that you: ? Drink enough fluid to keep your urine clear or pale yellow. ? Take over-the-counter or prescription medicines. ? Eat foods that are high in fiber, such as fresh fruits and vegetables, whole grains, and beans. ? Limit foods that are high in fat and processed sugars, such as fried and sweet foods. General instructions  Ask your health care provider when you will need an appointment to get your sutures or staples removed.  Keep all follow-up visits as told by your health care provider. This is important. Contact a health care provider if:  You have more redness, swelling, or pain around your incisions.  You have more fluid or blood coming from the incisions.  Your incisions feel warm to the touch.  You have pus or a bad smell coming from your incisions or your dressing.  You have a fever.  You have an incision that breaks open (edges not staying together) after sutures or staples have been  removed. Get help right away if:  You develop a rash.  You have chest pain or difficulty breathing.  You have pain or swelling in your legs.  You feel light-headed or you faint.  Your abdomen swells (becomes distended).  You have nausea or vomiting.  You have blood in your stool (feces). This information is not intended to replace advice given to you by your health care provider. Make sure you discuss any questions you have with your health care provider. Document Released: 02/23/2005 Document Revised: 04/25/2018 Document Reviewed: 05/07/2016 Elsevier Interactive Patient Education  2019 [employer].

## 2020-07-14 NOTE — Discharge Summary (Signed)
Physician Discharge Summary  [name] MRN: [id number] DOB/AGE: [birth year] 29 y.o.  Admit date: 07/08/2020 Discharge date: 07/14/20  Admission Diagnoses: pneumoperitoneum  Discharge Diagnoses:  Same as above, anastamotic leak, gout flare  Discharged Condition: good  Hospital Course: admitted for above.  Taken emergently to OR for anstamosis resection, end colostomy.  Please see op ntoe for details.  Post-op, gradual return of bowel function, pain controlled.  Developed gout flare so given colchicine, then IV solumedrol x 1, preventing full recurrence.  By time of discharge, patient remained afebrile, pain controlled, ostomy intact and functional, and gout controlled, ready for d/c home with continued close outpt monitoring.  JP drain in place, daily wet to dry dressing changes for midline wound, complete cipro and flagyl abx course for total of 14days.  Consults: None  Discharge Exam: Blood pressure 128/77, pulse 97, temperature 99 F (37.2 C), temperature source Oral, resp. rate 20, height 6' (1.829 m), weight 117.9 kg, SpO2 94 %. General appearance: alert, cooperative and no distress GI: soft, no guarding, midline incision c/d.  no sign of active infection.  ostomy productive, JP with minimal serosanguinous drainage  Disposition:  Discharge disposition: 01-Home or Self Care       Discharge Instructions    Discharge patient   Complete by: As directed    Discharge disposition: 01-Home or Self Care   Discharge patient date: 07/14/2020     Allergies as of 07/14/2020      Reactions   Amoxicillin Rash      Medication List    TAKE these medications   acetaminophen 325 MG tablet Commonly known as: Tylenol Take 2 tablets (650 mg total) by mouth every 8 (eight) hours as needed for mild pain.   allopurinol 300 MG tablet Commonly known as: ZYLOPRIM Take 300 mg by mouth daily.   carvedilol 12.5 MG tablet Commonly known as: COREG Take 12.5 mg by  mouth daily.   ciprofloxacin 500 MG tablet Commonly known as: CIPRO Take 1 tablet (500 mg total) by mouth 2 (two) times daily for 7 days.   docusate sodium 100 MG capsule Commonly known as: Colace Take 1 capsule (100 mg total) by mouth 2 (two) times daily as needed for up to 10 days for mild constipation.   HYDROcodone-acetaminophen 5-325 MG tablet Commonly known as: NORCO/VICODIN Take 1 tablet by mouth every 4 (four) hours as needed for moderate pain. What changed: Another medication with the same name was added. Make sure you understand how and when to take each.   HYDROcodone-acetaminophen 5-325 MG tablet Commonly known as: Norco Take 1 tablet by mouth every 6 (six) hours as needed for up to 5 days for moderate pain. What changed: You were already taking a medication with the same name, and this prescription was added. Make sure you understand how and when to take each.   metroNIDAZOLE 500 MG tablet Commonly known as: Flagyl Take 1 tablet (500 mg total) by mouth 3 (three) times daily for 7 days.   ondansetron 4 MG disintegrating tablet Commonly known as: ZOFRAN-ODT Take 1 tablet (4 mg total) by mouth every 6 (six) hours as needed for nausea.   spironolactone 25 MG tablet Commonly known as: ALDACTONE Take 25 mg by mouth daily.       Follow-up Information    Herbert Pun, MD Follow up in 1 week(s).   Specialty: General Surgery Why: post op Primary school teacher information: [place] [id number] [phone]  Total time spent arranging discharge was >69min. Signed: Benjamine Sprague 07/14/2020, 12:56 PM

## 2020-07-14 NOTE — Progress Notes (Signed)
Discharge instructions reviewed with the patient and the wife. Wife shown and educated on wound dressing change and JP drain care. Dr [name] wanted wet to dry 4x4 to open area on surgical wound and cover with ABD. All pharmacies in the surrounding area are closed. Dr [name] informed and said that the patient could start his antibiotics in the am. Patient sent out via wheelchair to his wife's waiting car

## 2020-08-09 ENCOUNTER — Other Ambulatory Visit: Payer: Self-pay

## 2020-08-09 ENCOUNTER — Emergency Department: Payer: No Typology Code available for payment source

## 2020-08-09 ENCOUNTER — Inpatient Hospital Stay
Admission: EM | Admit: 2020-08-09 | Discharge: 2020-09-24 | DRG: 004 | Payer: No Typology Code available for payment source | Attending: Pulmonary Disease | Admitting: Pulmonary Disease

## 2020-08-09 ENCOUNTER — Encounter: Payer: Self-pay | Admitting: *Deleted

## 2020-08-09 DIAGNOSIS — E8809 Other disorders of plasma-protein metabolism, not elsewhere classified: Secondary | ICD-10-CM | POA: Diagnosis not present

## 2020-08-09 DIAGNOSIS — Z433 Encounter for attention to colostomy: Secondary | ICD-10-CM

## 2020-08-09 DIAGNOSIS — R6521 Severe sepsis with septic shock: Secondary | ICD-10-CM | POA: Diagnosis present

## 2020-08-09 DIAGNOSIS — J80 Acute respiratory distress syndrome: Secondary | ICD-10-CM | POA: Diagnosis present

## 2020-08-09 DIAGNOSIS — L8990 Pressure ulcer of unspecified site, unspecified stage: Secondary | ICD-10-CM | POA: Insufficient documentation

## 2020-08-09 DIAGNOSIS — Z978 Presence of other specified devices: Secondary | ICD-10-CM

## 2020-08-09 DIAGNOSIS — G9341 Metabolic encephalopathy: Secondary | ICD-10-CM

## 2020-08-09 DIAGNOSIS — A419 Sepsis, unspecified organism: Secondary | ICD-10-CM | POA: Diagnosis present

## 2020-08-09 DIAGNOSIS — Z9049 Acquired absence of other specified parts of digestive tract: Secondary | ICD-10-CM | POA: Diagnosis not present

## 2020-08-09 DIAGNOSIS — I469 Cardiac arrest, cause unspecified: Secondary | ICD-10-CM | POA: Diagnosis not present

## 2020-08-09 DIAGNOSIS — Z720 Tobacco use: Secondary | ICD-10-CM | POA: Diagnosis not present

## 2020-08-09 DIAGNOSIS — Z880 Allergy status to penicillin: Secondary | ICD-10-CM

## 2020-08-09 DIAGNOSIS — Z431 Encounter for attention to gastrostomy: Secondary | ICD-10-CM

## 2020-08-09 DIAGNOSIS — I10 Essential (primary) hypertension: Secondary | ICD-10-CM | POA: Diagnosis not present

## 2020-08-09 DIAGNOSIS — J9622 Acute and chronic respiratory failure with hypercapnia: Secondary | ICD-10-CM | POA: Diagnosis not present

## 2020-08-09 DIAGNOSIS — J9690 Respiratory failure, unspecified, unspecified whether with hypoxia or hypercapnia: Secondary | ICD-10-CM | POA: Diagnosis present

## 2020-08-09 DIAGNOSIS — J1282 Pneumonia due to coronavirus disease 2019: Secondary | ICD-10-CM | POA: Diagnosis present

## 2020-08-09 DIAGNOSIS — I110 Hypertensive heart disease with heart failure: Secondary | ICD-10-CM | POA: Diagnosis present

## 2020-08-09 DIAGNOSIS — R5081 Fever presenting with conditions classified elsewhere: Secondary | ICD-10-CM | POA: Diagnosis not present

## 2020-08-09 DIAGNOSIS — B9561 Methicillin susceptible Staphylococcus aureus infection as the cause of diseases classified elsewhere: Secondary | ICD-10-CM

## 2020-08-09 DIAGNOSIS — R7881 Bacteremia: Secondary | ICD-10-CM | POA: Diagnosis not present

## 2020-08-09 DIAGNOSIS — J9621 Acute and chronic respiratory failure with hypoxia: Secondary | ICD-10-CM | POA: Diagnosis not present

## 2020-08-09 DIAGNOSIS — E46 Unspecified protein-calorie malnutrition: Secondary | ICD-10-CM | POA: Diagnosis present

## 2020-08-09 DIAGNOSIS — R000 Tachycardia, unspecified: Secondary | ICD-10-CM | POA: Diagnosis not present

## 2020-08-09 DIAGNOSIS — Z79899 Other long term (current) drug therapy: Secondary | ICD-10-CM

## 2020-08-09 DIAGNOSIS — G928 Other toxic encephalopathy: Secondary | ICD-10-CM | POA: Diagnosis not present

## 2020-08-09 DIAGNOSIS — D649 Anemia, unspecified: Secondary | ICD-10-CM | POA: Diagnosis not present

## 2020-08-09 DIAGNOSIS — R509 Fever, unspecified: Secondary | ICD-10-CM

## 2020-08-09 DIAGNOSIS — R7982 Elevated C-reactive protein (CRP): Secondary | ICD-10-CM | POA: Diagnosis not present

## 2020-08-09 DIAGNOSIS — U071 COVID-19: Principal | ICD-10-CM | POA: Diagnosis present

## 2020-08-09 DIAGNOSIS — L0291 Cutaneous abscess, unspecified: Secondary | ICD-10-CM

## 2020-08-09 DIAGNOSIS — R42 Dizziness and giddiness: Secondary | ICD-10-CM | POA: Diagnosis present

## 2020-08-09 DIAGNOSIS — R230 Cyanosis: Secondary | ICD-10-CM

## 2020-08-09 DIAGNOSIS — J9601 Acute respiratory failure with hypoxia: Secondary | ICD-10-CM

## 2020-08-09 DIAGNOSIS — R1319 Other dysphagia: Secondary | ICD-10-CM | POA: Diagnosis present

## 2020-08-09 DIAGNOSIS — J9600 Acute respiratory failure, unspecified whether with hypoxia or hypercapnia: Secondary | ICD-10-CM

## 2020-08-09 DIAGNOSIS — L89152 Pressure ulcer of sacral region, stage 2: Secondary | ICD-10-CM | POA: Diagnosis not present

## 2020-08-09 DIAGNOSIS — T380X5A Adverse effect of glucocorticoids and synthetic analogues, initial encounter: Secondary | ICD-10-CM | POA: Diagnosis present

## 2020-08-09 DIAGNOSIS — A4189 Other specified sepsis: Secondary | ICD-10-CM | POA: Diagnosis not present

## 2020-08-09 DIAGNOSIS — N179 Acute kidney failure, unspecified: Secondary | ICD-10-CM | POA: Diagnosis not present

## 2020-08-09 DIAGNOSIS — Z4659 Encounter for fitting and adjustment of other gastrointestinal appliance and device: Secondary | ICD-10-CM

## 2020-08-09 DIAGNOSIS — Z6835 Body mass index (BMI) 35.0-35.9, adult: Secondary | ICD-10-CM

## 2020-08-09 DIAGNOSIS — E875 Hyperkalemia: Secondary | ICD-10-CM | POA: Diagnosis present

## 2020-08-09 DIAGNOSIS — I5031 Acute diastolic (congestive) heart failure: Secondary | ICD-10-CM | POA: Diagnosis present

## 2020-08-09 DIAGNOSIS — Z0189 Encounter for other specified special examinations: Secondary | ICD-10-CM

## 2020-08-09 DIAGNOSIS — A858 Other specified viral encephalitis: Secondary | ICD-10-CM | POA: Diagnosis present

## 2020-08-09 DIAGNOSIS — Z930 Tracheostomy status: Secondary | ICD-10-CM | POA: Diagnosis not present

## 2020-08-09 DIAGNOSIS — E1165 Type 2 diabetes mellitus with hyperglycemia: Secondary | ICD-10-CM | POA: Diagnosis not present

## 2020-08-09 DIAGNOSIS — Z01818 Encounter for other preprocedural examination: Secondary | ICD-10-CM

## 2020-08-09 DIAGNOSIS — Z931 Gastrostomy status: Secondary | ICD-10-CM | POA: Diagnosis not present

## 2020-08-09 DIAGNOSIS — J15211 Pneumonia due to Methicillin susceptible Staphylococcus aureus: Secondary | ICD-10-CM | POA: Diagnosis present

## 2020-08-09 DIAGNOSIS — Z8719 Personal history of other diseases of the digestive system: Secondary | ICD-10-CM | POA: Diagnosis not present

## 2020-08-09 DIAGNOSIS — R339 Retention of urine, unspecified: Secondary | ICD-10-CM | POA: Diagnosis not present

## 2020-08-09 DIAGNOSIS — R6520 Severe sepsis without septic shock: Secondary | ICD-10-CM | POA: Diagnosis not present

## 2020-08-09 DIAGNOSIS — R570 Cardiogenic shock: Secondary | ICD-10-CM | POA: Diagnosis not present

## 2020-08-09 DIAGNOSIS — J189 Pneumonia, unspecified organism: Secondary | ICD-10-CM | POA: Diagnosis not present

## 2020-08-09 DIAGNOSIS — E874 Mixed disorder of acid-base balance: Secondary | ICD-10-CM | POA: Diagnosis present

## 2020-08-09 DIAGNOSIS — R609 Edema, unspecified: Secondary | ICD-10-CM

## 2020-08-09 DIAGNOSIS — Z794 Long term (current) use of insulin: Secondary | ICD-10-CM

## 2020-08-09 LAB — CBC WITH DIFFERENTIAL/PLATELET
Abs Immature Granulocytes: 0.08 10*3/uL — ABNORMAL HIGH (ref 0.00–0.07)
Basophils Absolute: 0 10*3/uL (ref 0.0–0.1)
Basophils Relative: 0 %
Eosinophils Absolute: 0 10*3/uL (ref 0.0–0.5)
Eosinophils Relative: 0 %
HCT: 40.2 % (ref 39.0–52.0)
Hemoglobin: 12.8 g/dL — ABNORMAL LOW (ref 13.0–17.0)
Immature Granulocytes: 1 %
Lymphocytes Relative: 10 %
Lymphs Abs: 1.2 10*3/uL (ref 0.7–4.0)
MCH: 28.9 pg (ref 26.0–34.0)
MCHC: 31.8 g/dL (ref 30.0–36.0)
MCV: 90.7 fL (ref 80.0–100.0)
Monocytes Absolute: 0.3 10*3/uL (ref 0.1–1.0)
Monocytes Relative: 2 %
Neutro Abs: 10.7 10*3/uL — ABNORMAL HIGH (ref 1.7–7.7)
Neutrophils Relative %: 87 %
Platelets: 160 10*3/uL (ref 150–400)
RBC: 4.43 MIL/uL (ref 4.22–5.81)
RDW: 15.3 % (ref 11.5–15.5)
WBC: 12.3 10*3/uL — ABNORMAL HIGH (ref 4.0–10.5)
nRBC: 0 % (ref 0.0–0.2)

## 2020-08-09 LAB — COMPREHENSIVE METABOLIC PANEL
ALT: 32 U/L (ref 0–44)
AST: 53 U/L — ABNORMAL HIGH (ref 15–41)
Albumin: 3.5 g/dL (ref 3.5–5.0)
Alkaline Phosphatase: 53 U/L (ref 38–126)
Anion gap: 16 — ABNORMAL HIGH (ref 5–15)
BUN: 7 mg/dL (ref 6–20)
CO2: 22 mmol/L (ref 22–32)
Calcium: 8.4 mg/dL — ABNORMAL LOW (ref 8.9–10.3)
Chloride: 95 mmol/L — ABNORMAL LOW (ref 98–111)
Creatinine, Ser: 1.1 mg/dL (ref 0.61–1.24)
GFR, Estimated: >60 mL/min (ref >60)
Glucose, Bld: 157 mg/dL — ABNORMAL HIGH (ref 70–99)
Potassium: 4.4 mmol/L (ref 3.5–5.1)
Sodium: 133 mmol/L — ABNORMAL LOW (ref 135–145)
Total Bilirubin: 1.5 mg/dL — ABNORMAL HIGH (ref 0.3–1.2)
Total Protein: 7.6 g/dL (ref 6.5–8.1)

## 2020-08-09 LAB — PROCALCITONIN: Procalcitonin: 0.92 ng/mL

## 2020-08-09 LAB — RESP PANEL BY RT-PCR (FLU A&B, COVID) ARPGX2
Influenza A by PCR: NEGATIVE
Influenza B by PCR: NEGATIVE
SARS Coronavirus 2 by RT PCR: POSITIVE — AB

## 2020-08-09 LAB — APTT: aPTT: 35 seconds (ref 24–36)

## 2020-08-09 LAB — PROTIME-INR
INR: 1 (ref 0.8–1.2)
Prothrombin Time: 12.8 seconds (ref 11.4–15.2)

## 2020-08-09 LAB — LACTIC ACID, PLASMA: Lactic Acid, Venous: 3.3 mmol/L (ref 0.5–1.9)

## 2020-08-09 IMAGING — DX DG CHEST 1V PORT
1 series · 1 of 1 positions shown · non-contrast
Comparison: Chest radiograph dated [DATE].

CLINICAL DATA: 29-year-old male with concern for sepsis.

EXAM:
PORTABLE CHEST 1 VIEW

[chest ap]
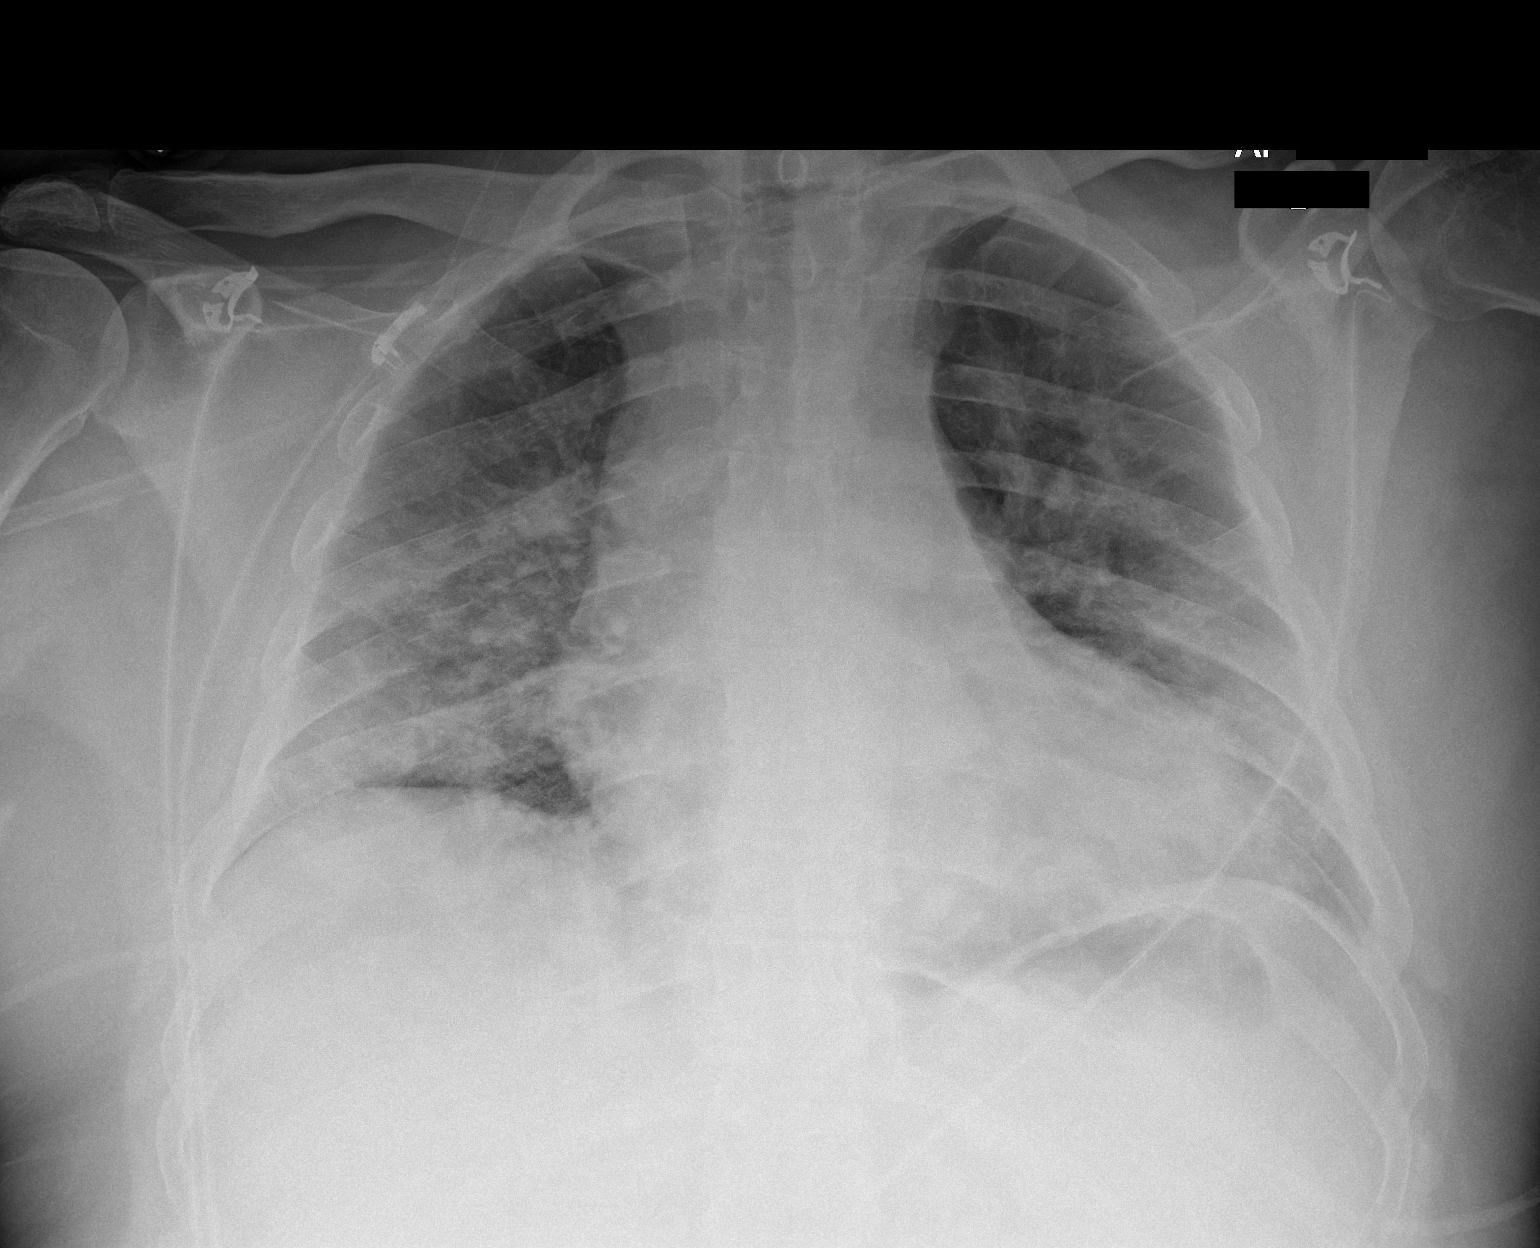

[1 of 1 positions shown; findings below may reference images not displayed]

FINDINGS: Bilateral confluent hazy densities most consistent with multifocal
pneumonia, possibly viral or atypical in etiology including
[KN]. Clinical correlation is recommended. There is no pleural
effusion pneumothorax. Mild cardiomegaly. No acute osseous
pathology.
IMPRESSION: Multifocal pneumonia. Clinical correlation and follow-up
recommended.

## 2020-08-09 MED ORDER — HYDROCODONE-ACETAMINOPHEN 5-325 MG PO TABS: 1.0000 | ORAL_TABLET | ORAL | Status: DC | PRN

## 2020-08-09 MED ORDER — LACTATED RINGERS IV BOLUS
1600.0000 mL | Freq: Once | INTRAVENOUS | Status: AC
  Administered 2020-08-09: 1600 mL via INTRAVENOUS

## 2020-08-09 MED ORDER — HYDROCOD POLST-CPM POLST ER 10-8 MG/5ML PO SUER
5.0000 mL | Freq: Two times a day (BID) | ORAL | Status: DC | PRN
Start: 2020-08-09 — End: 2020-08-12

## 2020-08-09 MED ORDER — METHYLPREDNISOLONE SODIUM SUCC 125 MG IJ SOLR
1.0000 mg/kg | Freq: Two times a day (BID) | INTRAMUSCULAR | Status: DC
  Administered 2020-08-10 (×2): 118.125 mg via INTRAVENOUS
  Filled 2020-08-09 (×2): qty 2

## 2020-08-09 MED ORDER — ENOXAPARIN SODIUM 60 MG/0.6ML ~~LOC~~ SOLN
0.5000 mg/kg | Freq: Every day | SUBCUTANEOUS | Status: DC
  Administered 2020-08-10 – 2020-08-26 (×17): 60 mg via SUBCUTANEOUS
  Filled 2020-08-09 (×18): qty 0.6

## 2020-08-09 MED ORDER — GUAIFENESIN-DM 100-10 MG/5ML PO SYRP: 10.0000 mL | ORAL_SOLUTION | ORAL | Status: DC | PRN

## 2020-08-09 MED ORDER — SODIUM CHLORIDE 0.9 % IV SOLN
250.0000 mL | INTRAVENOUS | Status: DC | PRN
  Administered 2020-08-10 – 2020-08-18 (×3): 250 mL via INTRAVENOUS

## 2020-08-09 MED ORDER — SODIUM CHLORIDE 0.9 % IV SOLN
2.0000 g | Freq: Once | INTRAVENOUS | Status: AC
  Administered 2020-08-10: 2 g via INTRAVENOUS
  Filled 2020-08-09: qty 20

## 2020-08-09 MED ORDER — ACETAMINOPHEN 325 MG PO TABS
650.0000 mg | ORAL_TABLET | Freq: Four times a day (QID) | ORAL | Status: DC | PRN
  Administered 2020-08-10: 650 mg via ORAL
  Filled 2020-08-09: qty 2

## 2020-08-09 MED ORDER — PREDNISONE 10 MG PO TABS: 50.0000 mg | ORAL_TABLET | Freq: Every day | ORAL | Status: DC

## 2020-08-09 MED ORDER — LACTATED RINGERS IV BOLUS
1000.0000 mL | Freq: Once | INTRAVENOUS | Status: AC
  Administered 2020-08-09: 1000 mL via INTRAVENOUS

## 2020-08-09 MED ORDER — LEVOFLOXACIN IN D5W 750 MG/150ML IV SOLN: 750.0000 mg | Freq: Once | INTRAVENOUS | Status: DC

## 2020-08-09 MED ORDER — SODIUM CHLORIDE 0.9 % IV SOLN
100.0000 mg | Freq: Every day | INTRAVENOUS | Status: AC
  Administered 2020-08-10 – 2020-08-12 (×3): 100 mg via INTRAVENOUS
  Filled 2020-08-09 (×2): qty 20
  Filled 2020-08-09: qty 100
  Filled 2020-08-09: qty 20

## 2020-08-09 MED ORDER — DEXAMETHASONE SODIUM PHOSPHATE 10 MG/ML IJ SOLN
10.0000 mg | Freq: Once | INTRAMUSCULAR | Status: AC
  Administered 2020-08-09: 10 mg via INTRAVENOUS
  Filled 2020-08-09: qty 1

## 2020-08-09 MED ORDER — ALBUTEROL SULFATE HFA 108 (90 BASE) MCG/ACT IN AERS
2.0000 | INHALATION_SPRAY | Freq: Four times a day (QID) | RESPIRATORY_TRACT | Status: DC | PRN
  Filled 2020-08-09: qty 6.7

## 2020-08-09 MED ORDER — SODIUM CHLORIDE 0.9 % IV SOLN
500.0000 mg | Freq: Once | INTRAVENOUS | Status: AC
  Administered 2020-08-09: 500 mg via INTRAVENOUS
  Filled 2020-08-09: qty 500

## 2020-08-09 MED ORDER — SODIUM CHLORIDE 0.9 % IV SOLN
200.0000 mg | Freq: Once | INTRAVENOUS | Status: AC
  Administered 2020-08-10: 200 mg via INTRAVENOUS
  Filled 2020-08-09: qty 200

## 2020-08-09 MED ORDER — ONDANSETRON HCL 4 MG/2ML IJ SOLN: 4.0000 mg | Freq: Four times a day (QID) | INTRAMUSCULAR | Status: DC | PRN

## 2020-08-09 MED ORDER — ALLOPURINOL 300 MG PO TABS
300.0000 mg | ORAL_TABLET | Freq: Every day | ORAL | Status: DC
  Administered 2020-08-10 – 2020-08-12 (×2): 300 mg via ORAL
  Filled 2020-08-09 (×2): qty 1

## 2020-08-09 MED ORDER — ONDANSETRON HCL 4 MG PO TABS: 4.0000 mg | ORAL_TABLET | Freq: Four times a day (QID) | ORAL | Status: DC | PRN

## 2020-08-09 NOTE — ED Notes (Signed)
MD at bedside. Pt able to talk to him in broken sentences. Pt remains diaphoretic.

## 2020-08-09 NOTE — ED Notes (Signed)
RN called to notify respiratory of MD verbal order for heated High Flow oxygen.

## 2020-08-09 NOTE — Progress Notes (Signed)
CODE SEPSIS - PHARMACY COMMUNICATION  **Broad Spectrum Antibiotics should be administered within 1 hour of Sepsis diagnosis**  Time Code Sepsis Called/Page Received: 2114  Antibiotics Ordered: Azithromycin  Time of 1st antibiotic administration: 2338  Additional action taken by pharmacy: Messaged provider at 2156 regarding ordering antibiotics. Still awaiting further data at that time to guide treatment  Benita Gutter 08/09/2020  11:41 PM

## 2020-08-09 NOTE — Consult Note (Signed)
Remdesivir - Pharmacy Brief Note   O:  ALT: 32 CXR: "Multifocal pneumonia" SpO2: Hypoxic requiring supplemental oxygen   A/P:  12/21 SARS-CoV-2 PCR (+)  Remdesivir 200 mg IVPB once followed by 100 mg IVPB daily x 4 days.   Benita Gutter 08/09/2020 10:36 PM

## 2020-08-09 NOTE — ED Triage Notes (Signed)
Pt to ED reporting he was dx with COVID on the 14th (1 week ago) and was starting feel slightly better until today. Pt reporting today he started feeling dizzy, "loopy" and diaphoretic. Fever spiked to 104.3 which pt took ibuprofen for 2-3 hours ago. Pt appears SOB in triage room. O2 saturation of 66 on RA.

## 2020-08-09 NOTE — H&P (Signed)
History and Physical    Craig [name] [id number] DOB: [birth year] DOA: 08/09/2020  PCP: [name], MD   Patient coming from: Home   Chief Complaint: SOB, lightheaded, cough, fever  HPI: Craig [name] is a 29 y.o. male with medical history significant for hypertension and perforated diverticulitis status post partial colectomy and anastomotic leak last month status post emergent anastomosis resection and end colostomy, now presenting to emergency department for evaluation of worsening shortness of breath, cough, fever, and lightheadedness.  Patient reports that he developed cough and malaise 1 week ago, tested positive for COVID-19, was beginning to feel little better, but then worsened acutely over the past day.  He has had worsening shortness of breath, worsening cough, high fever, and lightheadedness in the past day.  Denies any recent abdominal pain.  Denies chest pain or hemoptysis.  He had a fever of 104.3 F for which he took Advil at home 2 or 3 hours prior to arrival.  ED Course: Upon arrival to the ED, patient is found to be afebrile, saturating 66% on room air, tachypneic in the upper 30s, tachycardic in the 120s, and with blood pressure 85/60.  EKG features sinus tachycardia with rate 128.  Chest x-ray is concerning for multifocal pneumonia.  Chemistry panel notable for creatinine 1.10, up from 0.5 last month.  CBC features a leukocytosis 12,300.  Lactic acid is elevated to 3.3.  COVID-19 PCR is positive.  Procalcitonin is 0.91. Blood cultures were collected in the emergency department and the patient was treated with 3.6 L of LR, azithromycin, Decadron, and remdesivir.  He was started on heated high flow nasal cannula.  Review of Systems:  All other systems reviewed and apart from HPI, are negative.  Past Medical History:  Diagnosis Date  . Gout   . Hypertension   . Rupture of bowel [facility])     Past Surgical History:  Procedure Laterality Date  .  COLONOSCOPY WITH PROPOFOL N/A 05/18/2020   Procedure: COLONOSCOPY WITH PROPOFOL;  Surgeon: Benjamine Sprague, DO;  Location: [facility] ENDOSCOPY;  Service: General;  Laterality: N/A;  . COLOSTOMY N/A 11/22/2019   Procedure: COLOSTOMY;  Surgeon: Herbert Pun, MD;  Location: [facility] ORS;  Service: General;  Laterality: N/A;  . LAPAROTOMY N/A 11/22/2019   Procedure: EXPLORATORY LAPAROTOMY;  Surgeon: Herbert Pun, MD;  Location: [facility] ORS;  Service: General;  Laterality: N/A;  . LAPAROTOMY N/A 07/08/2020   Procedure: EXPLORATORY LAPAROTOMY;  Surgeon: Herbert Pun, MD;  Location: [facility] ORS;  Service: General;  Laterality: N/A;  . PARTIAL COLECTOMY N/A 11/22/2019   Procedure: PARTIAL COLECTOMY;  Surgeon: Herbert Pun, MD;  Location: [facility] ORS;  Service: General;  Laterality: N/A;  . XI ROBOTIC ASSISTED COLOSTOMY TAKEDOWN N/A 06/28/2020   Procedure: XI ROBOTIC ASSISTED COLOSTOMY TAKEDOWN CONVERTED TO OPEN PROCEDURE;  Surgeon: Herbert Pun, MD;  Location: [facility] ORS;  Service: General;  Laterality: N/A;    Social History:   reports that he has never smoked. His smokeless tobacco use includes chew. He reports current alcohol use. He reports that he does not use drugs.  Allergies  Allergen Reactions  . Amoxicillin Rash    Family History  Problem Relation Age of Onset  . Healthy Mother   . Healthy Father      Prior to Admission medications   Medication Sig Start Date End Date Taking? Authorizing Provider  allopurinol (ZYLOPRIM) 300 MG tablet Take 300 mg by mouth daily. 07/04/20  Yes [provider]  amLODipine-valsartan (EXFORGE) 10-320 MG tablet  Take 1 tablet by mouth daily. 07/05/20  Yes [provider]  carvedilol (COREG) 25 MG tablet Take 25 mg by mouth 2 (two) times daily. 07/04/20  Yes [provider]  ondansetron (ZOFRAN-ODT) 4 MG disintegrating tablet Take 1 tablet (4 mg total) by mouth every 6 (six) hours as needed for nausea. 07/03/20   Yes Herbert Pun, MD  spironolactone (ALDACTONE) 25 MG tablet Take 25 mg by mouth daily.  05/12/20  Yes [provider]    Physical Exam: Vitals:   08/10/20 0115 08/10/20 0200 08/10/20 0230 08/10/20 0300  BP: 108/60 108/70 105/68 124/78  Pulse: 96 97 91 86  Resp: (!) 25 (!) 35 (!) 26 18  Temp:      TempSrc:      SpO2: 95% 91% 94% 94%  Weight:      Height:        Constitutional: NAD, calm  Eyes: PERTLA, lids and conjunctivae normal ENMT: Mucous membranes are moist. Posterior pharynx clear of any exudate or lesions.   Neck: normal, supple, no masses, no thyromegaly Respiratory: Mild tachypnea, no wheezing. No pallor or cyanosis.  Cardiovascular: S1 & S2 heard, regular rate and rhythm. No extremity edema.   Abdomen: No distension, no tenderness, soft. Bowel sounds active.  Musculoskeletal: no clubbing / cyanosis. No joint deformity upper and lower extremities.   Skin: no significant rashes, lesions, ulcers. Warm, dry, well-perfused. Neurologic: CN 2-12 grossly intact. Sensation intact. Moving all extremities.  Psychiatric: Alert and oriented to person, place, and situation. Very pleasant and cooperative.    Labs and Imaging on Admission: I have personally reviewed following labs and imaging studies  CBC: Recent Labs  Lab 08/09/20 2111  WBC 12.3*  NEUTROABS 10.7*  HGB 12.8*  HCT 40.2  MCV 90.7  PLT 882   Basic Metabolic Panel: Recent Labs  Lab 08/09/20 2111  NA 133*  K 4.4  CL 95*  CO2 22  GLUCOSE 157*  BUN 7  CREATININE 1.10  CALCIUM 8.4*   GFR: Estimated Creatinine Clearance: 129 mL/min (by C-G formula based on SCr of 1.1 mg/dL). Liver Function Tests: Recent Labs  Lab 08/09/20 2111  AST 53*  ALT 32  ALKPHOS 53  BILITOT 1.5*  PROT 7.6  ALBUMIN 3.5   No results for input(s): LIPASE, AMYLASE in the last 168 hours. No results for input(s): AMMONIA in the last 168 hours. Coagulation Profile: Recent Labs  Lab 08/09/20 2111  INR 1.0    Cardiac Enzymes: No results for input(s): CKTOTAL, CKMB, CKMBINDEX, TROPONINI in the last 168 hours. BNP (last 3 results) No results for input(s): PROBNP in the last 8760 hours. HbA1C: No results for input(s): HGBA1C in the last 72 hours. CBG: No results for input(s): GLUCAP in the last 168 hours. Lipid Profile: No results for input(s): CHOL, HDL, LDLCALC, TRIG, CHOLHDL, LDLDIRECT in the last 72 hours. Thyroid Function Tests: No results for input(s): TSH, T4TOTAL, FREET4, T3FREE, THYROIDAB in the last 72 hours. Anemia Panel: No results for input(s): VITAMINB12, FOLATE, FERRITIN, TIBC, IRON, RETICCTPCT in the last 72 hours. Urine analysis:    Component Value Date/Time   COLORURINE YELLOW (A) 07/09/2020 0415   APPEARANCEUR HAZY (A) 07/09/2020 0415   LABSPEC 1.013 07/09/2020 0415   PHURINE 5.0 07/09/2020 0415   GLUCOSEU NEGATIVE 07/09/2020 0415   HGBUR SMALL (A) 07/09/2020 0415   BILIRUBINUR NEGATIVE 07/09/2020 [place] 07/09/2020 0415   [place] NEGATIVE 07/09/2020 0415   NITRITE NEGATIVE 07/09/2020 0415   LEUKOCYTESUR  NEGATIVE 07/09/2020 0415   Sepsis Labs: @LABRCNTIP (procalcitonin:4,lacticidven:4) ) Recent Results (from the past 240 hour(s))  Resp Panel by RT-PCR (Flu A&B, Covid) Nasopharyngeal Swab     Status: Abnormal   Collection Time: 08/09/20  9:11 PM   Specimen: Nasopharyngeal Swab; Nasopharyngeal(NP) swabs in vial transport medium  Result Value Ref Range Status   SARS Coronavirus 2 by RT PCR POSITIVE (A) NEGATIVE Final    Comment: RESULT CALLED TO, READ BACK BY AND VERIFIED WITH: JANE MARTIN @2215  ON 08/09/20 [facility] (NOTE) SARS-CoV-2 target nucleic acids are DETECTED.  The SARS-CoV-2 RNA is generally detectable in upper respiratory specimens during the acute phase of infection. Positive results are indicative of the presence of the identified virus, but do not rule out bacterial infection or co-infection with other pathogens not detected by the  test. Clinical correlation with patient history and other diagnostic information is necessary to determine patient infection status. The expected result is Negative.  Fact Sheet for Patients: EntrepreneurPulse.com.au  Fact Sheet for Healthcare Providers: IncredibleEmployment.be  This test is not yet approved or cleared by the [place] FDA and  has been authorized for detection and/or diagnosis of SARS-CoV-2 by FDA under an Emergency Use Authorization (EUA).  This EUA will remain in effect (meaning this test can be  used) for the duration of  the COVID-19 declaration under Section 564(b)(1) of the Act, 21 U.S.C. section 360bbb-3(b)(1), unless the authorization is terminated or revoked sooner.     Influenza A by PCR NEGATIVE NEGATIVE Final   Influenza B by PCR NEGATIVE NEGATIVE Final    Comment: (NOTE) The Xpert Xpress SARS-CoV-2/FLU/RSV plus assay is intended as an aid in the diagnosis of influenza from Nasopharyngeal swab specimens and should not be used as a sole basis for treatment. Nasal washings and aspirates are unacceptable for Xpert Xpress SARS-CoV-2/FLU/RSV testing.  Fact Sheet for Patients: EntrepreneurPulse.com.au  Fact Sheet for Healthcare Providers: IncredibleEmployment.be  This test is not yet approved or cleared by the [place] FDA and has been authorized for detection and/or diagnosis of SARS-CoV-2 by FDA under an Emergency Use Authorization (EUA). This EUA will remain in effect (meaning this test can be used) for the duration of the COVID-19 declaration under Section 564(b)(1) of the Act, 21 U.S.C. section 360bbb-3(b)(1), unless the authorization is terminated or revoked.  Performed at [facility], [address] [place], [place] [address]      Radiological Exams on Admission: DG Chest [place] 1 View  Result Date: 08/09/2020 CLINICAL DATA:  29 year old male with  concern for sepsis. EXAM: PORTABLE CHEST 1 VIEW COMPARISON:  Chest radiograph dated 07/13/2020. FINDINGS: Bilateral confluent hazy densities most consistent with multifocal pneumonia, possibly viral or atypical in etiology including COVID-19. Clinical correlation is recommended. There is no pleural effusion pneumothorax. Mild cardiomegaly. No acute osseous pathology. IMPRESSION: Multifocal pneumonia. Clinical correlation and follow-up recommended. Electronically Signed   By: Anner Crete M.D.   On: 08/09/2020 21:37    EKG: Independently reviewed. Sinus tachycardia, rate 128.   Assessment/Plan   1. COVID-19; Acute hypoxic respiratory failure  - Presents with SOB, cough, and fevers in setting of COVID-19 and is found to be saturating 66% initially with RR in upper 30s  - He was started on HHFNC with 40 Lpm supplemental O2, remdesivir, and Decdron  - Check CRP and d-dimer, continue remdesivir and steroids, hold off on Actemra for now given suspected bacterial infection and hx bowel perforations, trend markers, prone as tolerated    2. Severe sepsis  secondary to pneumonia  - Presents with cough, SOB, fevers, and lightheadedness, found to have COVID-19 with acute hypoxic respiratory failure, and there is concern for bacterial superinfection as he had been improving before worsening acutely yesterday, has leukocytosis, and modest elevation in procalcitonin  - Blood cultures collected in ED, 30 cc/kg fluid bolus was given and he was started on antibiotics  - Culture sputum, check strep pneumo and legionella antigens, continue antibiotics with Rocephin and azithromycin, trend lactate and procalcitonin   3. AKI  - SCr is 1.10 on admission, up from 0.50 last month  - Likely prerenal azotemia in setting of hypotension  - He was fluid-resuscitated in ED with improvement in BP  - Hold ARB, monitor   4. History of HTN  - Hypotensive in ED, improved with fluid-resuscitation  - Hold antihypertensives  for now     DVT prophylaxis: Lovenox  Code Status: Full  Family Communication: Discussed with patient   Disposition Plan:  Patient is from: home  Anticipated d/c is to: TBD Anticipated d/c date is: 08/16/20 Patient currently: Requiring high amount of supplemental O2  Consults called: None  Admission status: Inpatient     Vianne Bulls, MD [facility]  08/10/2020, 3:38 AM

## 2020-08-09 NOTE — ED Notes (Signed)
COVID   Craig Starch, MD 08/09/20 2306

## 2020-08-09 NOTE — ED Notes (Signed)
Pt placed on NRB at 15L.

## 2020-08-09 NOTE — Progress Notes (Signed)
PHARMACIST - PHYSICIAN COMMUNICATION  CONCERNING:  Enoxaparin (Lovenox) for DVT Prophylaxis    RECOMMENDATION: Patient was prescribed enoxaparin 40mg  q24 hours for VTE prophylaxis.   Filed Weights   08/09/20 2036  Weight: 117.9 kg (259 lb 14.8 oz)    Body mass index is 35.25 kg/m.  Estimated Creatinine Clearance: 129 mL/min (by C-G formula based on SCr of 1.1 mg/dL).   Based on [place] patient is candidate for enoxaparin 0.5mg /kg TBW SQ every 24 hours based on BMI being >30.  DESCRIPTION: Pharmacy has adjusted enoxaparin dose per [facility] policy.  Patient is now receiving enoxaparin 60 mg every 24 hours   Benita Gutter 08/09/2020 11:58 PM

## 2020-08-09 NOTE — ED Provider Notes (Signed)
[facility] Emergency Department Provider Note   ____________________________________________   Event Date/Time   First MD Initiated Contact with Patient 08/09/20 2044     (approximate)  I have reviewed the triage vital signs and the nursing notes.   HISTORY  Chief Complaint Dizziness and Fever  EM caveat, some limitations due to acuity of illness and respiratory failure  HPI Craig [name] is a 29 y.o. male here for evaluation of shortness of breath  Patient reports that he was diagnosed with Covid on a test at a nursing facility parking lot couple days ago.  His son also has COVID-19.  He reports he is had symptoms of a cough fever and the last couple days increasing shortness of breath  No nausea or vomiting.  Still eating and drinking.  Reports he is started feeling very short of breath over the last day and a half and starting to feel lightheaded  No history of blood clots.  No chest pain.  No sharp discomfort.  No leg swelling.  Denies previous medical illness.  Has had slight productive cough   Was very short of breath earlier but started to feel better now that he is on oxygen  I have fever to 104  Past Medical History:  Diagnosis Date  . Gout   . Hypertension   . Rupture of bowel [facility])     Patient Active Problem List   Diagnosis Date Noted  . 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 Pneumoperitoneum 07/08/2020  . 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 Diverticulitis of colon with perforation 11/22/2019    Past Surgical History:  Procedure Laterality Date  . COLONOSCOPY WITH PROPOFOL N/A 05/18/2020   Procedure: COLONOSCOPY WITH PROPOFOL;  Surgeon: Benjamine Sprague, DO;  Location: [facility] ENDOSCOPY;  Service: General;  Laterality: N/A;  . COLOSTOMY N/A 11/22/2019   Procedure: COLOSTOMY;  Surgeon: Herbert Pun, MD;  Location: [facility] ORS;  Service: General;  Laterality: N/A;  . LAPAROTOMY N/A 11/22/2019    Procedure: EXPLORATORY LAPAROTOMY;  Surgeon: Herbert Pun, MD;  Location: [facility] ORS;  Service: General;  Laterality: N/A;  . LAPAROTOMY N/A 07/08/2020   Procedure: EXPLORATORY LAPAROTOMY;  Surgeon: Herbert Pun, MD;  Location: [facility] ORS;  Service: General;  Laterality: N/A;  . PARTIAL COLECTOMY N/A 11/22/2019   Procedure: PARTIAL COLECTOMY;  Surgeon: Herbert Pun, MD;  Location: [facility] ORS;  Service: General;  Laterality: N/A;  . XI ROBOTIC ASSISTED COLOSTOMY TAKEDOWN N/A 06/28/2020   Procedure: XI ROBOTIC ASSISTED COLOSTOMY TAKEDOWN CONVERTED TO OPEN PROCEDURE;  Surgeon: Herbert Pun, MD;  Location: [facility] ORS;  Service: General;  Laterality: N/A;    Prior to Admission medications   Medication Sig Start Date End Date Taking? Authorizing Provider  allopurinol (ZYLOPRIM) 300 MG tablet Take 300 mg by mouth daily. 07/04/20  Yes [provider]  amLODipine-valsartan (EXFORGE) 10-320 MG tablet Take 1 tablet by mouth daily. 07/05/20  Yes [provider]  carvedilol (COREG) 25 MG tablet Take 25 mg by mouth 2 (two) times daily. 07/04/20  Yes [provider]  ondansetron (ZOFRAN-ODT) 4 MG disintegrating tablet Take 1 tablet (4 mg total) by mouth every 6 (six) hours as needed for nausea. 07/03/20  Yes Herbert Pun, MD  spironolactone (ALDACTONE) 25 MG tablet Take 25 mg by mouth daily.  05/12/20  Yes [provider]    Allergies Amoxicillin  Family History  Problem Relation Age of Onset  . Healthy Mother   . Healthy Father  Social History Social History   Tobacco Use  . Smoking status: Never Smoker  . Smokeless tobacco: Current User    Types: Chew  Vaping Use  . Vaping Use: Never used  Substance Use Topics  . Alcohol use: Yes    Comment: pint to a fifth per day per patient . Patient reports he hasn't had anything to drink in three weeks.  . Drug use: Never    Review of Systems Constitutional: Fevers and  fatigue eyes: No visual changes. ENT: No sore throat. Cardiovascular: Denies chest pain. Respiratory: See HPI Gastrointestinal: No abdominal pain.   Musculoskeletal: Negative for back pain. Skin: Negative for rash. Neurological: Negative for  weakness or numbness.    ____________________________________________   PHYSICAL EXAM:  VITAL SIGNS: ED Triage Vitals  Enc Vitals Group     BP 08/09/20 2018 98/64     Pulse Rate 08/09/20 2018 (!) 124     Resp 08/09/20 2018 (!) 25     Temp 08/09/20 2018 98.6 F (37 C)     Temp Source 08/09/20 2018 Oral     SpO2 08/09/20 2018 (!) 66 %     Weight 08/09/20 2036 259 lb 14.8 oz (117.9 kg)     Height 08/09/20 2036 6' (1.829 m)     Head Circumference --      Peak Flow --      Pain Score 08/09/20 2036 0     Pain Loc --      Pain Edu? --      Excl. in [place]? --     Constitutional: Alert and oriented.  Appears to have mild increased work of breathing.  Appears much better than his initial oxygen saturation would make me predicted Eyes: Conjunctivae are normal. Head: Atraumatic. Nose: No congestion/rhinnorhea. Mouth/Throat: Mucous membranes are moist. Neck: No stridor.  Cardiovascular: Mildly tachycardic rate, regular rhythm. Grossly normal heart sounds.  Good peripheral circulation. Respiratory: Mild to moderate tachypnea with mild use of accessory muscles speaks in phrases.  Clear lung sounds except in the left lower lung he has rails.  No wheezing.  No tripoding or evidence of obvious impending failure at this point other than his associated hypoxia which corrects on nonrebreather to 90%  Gastrointestinal: Soft and nontender. No distention.  Morbid obesity Musculoskeletal: No lower extremity tenderness nor edema. Neurologic:  Normal speech and language. No gross focal neurologic deficits are appreciated.  Skin:  Skin is warm, dry and intact. No rash noted. Psychiatric: Mood and affect are normal. Speech and behavior are  normal.  ____________________________________________   LABS (all labs ordered are listed, but only abnormal results are displayed)  Labs Reviewed  RESP PANEL BY RT-PCR (FLU A&B, COVID) ARPGX2 - Abnormal; Notable for the following components:      Result Value   SARS Coronavirus 2 by RT PCR POSITIVE (*)    All other components within normal limits  LACTIC ACID, PLASMA - Abnormal; Notable for the following components:   Lactic Acid, Venous 3.3 (*)    All other components within normal limits  COMPREHENSIVE METABOLIC PANEL - Abnormal; Notable for the following components:   Sodium 133 (*)    Chloride 95 (*)    Glucose, Bld 157 (*)    Calcium 8.4 (*)    AST 53 (*)    Total Bilirubin 1.5 (*)    Anion gap 16 (*)    All other components within normal limits  CBC WITH DIFFERENTIAL/PLATELET - Abnormal; Notable for the following components:  WBC 12.3 (*)    Hemoglobin 12.8 (*)    Neutro Abs 10.7 (*)    Abs Immature Granulocytes 0.08 (*)    All other components within normal limits  URINE CULTURE  CULTURE, BLOOD (ROUTINE X 2)  CULTURE, BLOOD (ROUTINE X 2)  PROTIME-INR  APTT  PROCALCITONIN  LACTIC ACID, PLASMA  URINALYSIS, COMPLETE (UACMP) WITH MICROSCOPIC  PROCALCITONIN   ____________________________________________  EKG  Reviewed entered by me at 2030 Heart rate 130 QRS 80 QTc 470 Sinus tachycardia without evidence of acute ischemia ____________________________________________  RADIOLOGY  DG Chest Port 1 View  Result Date: 08/09/2020 CLINICAL DATA:  29 year old male with concern for sepsis. EXAM: PORTABLE CHEST 1 VIEW COMPARISON:  Chest radiograph dated 07/13/2020. FINDINGS: Bilateral confluent hazy densities most consistent with multifocal pneumonia, possibly viral or atypical in etiology including COVID-19. Clinical correlation is recommended. There is no pleural effusion pneumothorax. Mild cardiomegaly. No acute osseous pathology. IMPRESSION: Multifocal pneumonia.  Clinical correlation and follow-up recommended. Electronically Signed   By: Anner Crete M.D.   On: 08/09/2020 21:37    Imaging reviewed concerning for multifocal pneumonia ____________________________________________   PROCEDURES  Procedure(s) performed: None  Procedures  Critical Care performed: Yes, see critical care note(s)  CRITICAL CARE Performed by: Delman Kitten   Total critical care time: 30 minutes  Critical care time was exclusive of separately billable procedures and treating other patients.  Critical care was necessary to treat or prevent imminent or life-threatening deterioration.  Critical care was time spent personally by me on the following activities: development of treatment plan with patient and/or surrogate as well as nursing, discussions with consultants, evaluation of patient's response to treatment, examination of patient, obtaining history from patient or surrogate, ordering and performing treatments and interventions, ordering and review of laboratory studies, ordering and review of radiographic studies, pulse oximetry and re-evaluation of patient's condition.  ____________________________________________   INITIAL IMPRESSION / ASSESSMENT AND PLAN / ED COURSE  Pertinent labs & imaging results that were available during my care of the patient were reviewed by me and considered in my medical decision making (see chart for details).   Patient reports had a Covid test that was positive with symptoms consistent with the disease but I do not have a confirmatory test urine he reports that the test one to been documented it with some type of a rapid test that his wife was able to facilitate at a nursing home she works at.  He reports its not documented though  We will send confirmatory test.  Start IV steroids in anticipation of positive Covid, however also on his differential would certainly include other etiologies of hypoxia and with his noted [place] in the  left lower lung this could also represent bacterial pneumonia or other acute intrapulmonary process.  No execute cardiac symptoms demonstrated sinus tachycardia no pleurisy no chest pain.  No signs or symptoms suggest acute DVT or PE especially if his Covid test is positive  No associate abdominal symptoms.  Normal mental status    Clinical Course as of 08/09/20 2236  Tue Aug 09, 2020  2201 ED Sepsis - Repeat Assessment  2201   Heart:      Tachycardia improving  Lungs:     On heated high flow, speaking in full and clear sentences reports he feels better oxygenation improved no distress noted at this time  Capillary Refill:   And less than 2 seconds [MQ]  2202 Awaiting Covid test, if this is positive I would suspect this  to be his primary etiology and far less likely bacterial pneumonia.  Also awaiting procalcitonin which I think will be helpful, if less than 0.2 would be apt to think this is unlikely bacterial pneumonia [MQ]    Clinical Course User Index [MQ] Delman Kitten, MD   Vitals:   08/09/20 2144 08/09/20 2200  BP:  (!) 94/56  Pulse: (!) 105 (!) 102  Resp: 19 (!) 30  Temp:    SpO2: 97% 100%   ----------------------------------------- 10:36 PM on 08/09/2020 -----------------------------------------  Admission discussed with Dr. Myna Hidalgo.  Patient understand agreeable plan for admission   ____________________________________________   FINAL CLINICAL IMPRESSION(S) / ED DIAGNOSES  Final diagnoses:  Severe sepsis ([place])  COVID-19  Acute respiratory failure with hypoxia ([place])        Note:  This document was prepared using Dragon voice recognition software and may include unintentional dictation errors       Delman Kitten, MD 08/09/20 2236

## 2020-08-09 NOTE — Progress Notes (Signed)
Following for Code sepsis

## 2020-08-10 DIAGNOSIS — U071 COVID-19: Secondary | ICD-10-CM | POA: Diagnosis not present

## 2020-08-10 DIAGNOSIS — J9601 Acute respiratory failure with hypoxia: Secondary | ICD-10-CM | POA: Diagnosis not present

## 2020-08-10 LAB — CBC WITH DIFFERENTIAL/PLATELET
Abs Immature Granulocytes: 0.04 10*3/uL (ref 0.00–0.07)
Basophils Absolute: 0 10*3/uL (ref 0.0–0.1)
Basophils Relative: 0 %
Eosinophils Absolute: 0 10*3/uL (ref 0.0–0.5)
Eosinophils Relative: 0 %
HCT: 36.9 % — ABNORMAL LOW (ref 39.0–52.0)
Hemoglobin: 12.1 g/dL — ABNORMAL LOW (ref 13.0–17.0)
Immature Granulocytes: 1 %
Lymphocytes Relative: 10 %
Lymphs Abs: 0.6 10*3/uL — ABNORMAL LOW (ref 0.7–4.0)
MCH: 30.3 pg (ref 26.0–34.0)
MCHC: 32.8 g/dL (ref 30.0–36.0)
MCV: 92.5 fL (ref 80.0–100.0)
Monocytes Absolute: 0.1 10*3/uL (ref 0.1–1.0)
Monocytes Relative: 1 %
Neutro Abs: 5.1 10*3/uL (ref 1.7–7.7)
Neutrophils Relative %: 88 %
Platelets: 112 10*3/uL — ABNORMAL LOW (ref 150–400)
RBC: 3.99 MIL/uL — ABNORMAL LOW (ref 4.22–5.81)
RDW: 15.2 % (ref 11.5–15.5)
WBC: 5.8 10*3/uL (ref 4.0–10.5)
nRBC: 0 % (ref 0.0–0.2)

## 2020-08-10 LAB — PROCALCITONIN: Procalcitonin: 1.22 ng/mL

## 2020-08-10 LAB — URINALYSIS, COMPLETE (UACMP) WITH MICROSCOPIC
Bacteria, UA: NONE SEEN
Bilirubin Urine: NEGATIVE
Glucose, UA: >=500 mg/dL — AB
Hgb urine dipstick: NEGATIVE
Ketones, ur: NEGATIVE mg/dL
Leukocytes,Ua: NEGATIVE
Nitrite: NEGATIVE
Protein, ur: NEGATIVE mg/dL
Specific Gravity, Urine: 1.008 (ref 1.005–1.030)
Squamous Epithelial / HPF: NONE SEEN (ref 0–5)
pH: 6 (ref 5.0–8.0)

## 2020-08-10 LAB — FIBRIN DERIVATIVES D-DIMER (ARMC ONLY): Fibrin derivatives D-dimer (ARMC): 1571.81 ng/mL (FEU) — ABNORMAL HIGH (ref 0.00–499.00)

## 2020-08-10 LAB — COMPREHENSIVE METABOLIC PANEL
ALT: 28 U/L (ref 0–44)
AST: 41 U/L (ref 15–41)
Albumin: 2.8 g/dL — ABNORMAL LOW (ref 3.5–5.0)
Alkaline Phosphatase: 49 U/L (ref 38–126)
Anion gap: 10 (ref 5–15)
BUN: 9 mg/dL (ref 6–20)
CO2: 29 mmol/L (ref 22–32)
Calcium: 8.9 mg/dL (ref 8.9–10.3)
Chloride: 100 mmol/L (ref 98–111)
Creatinine, Ser: 0.69 mg/dL (ref 0.61–1.24)
GFR, Estimated: >60 mL/min (ref >60)
Glucose, Bld: 287 mg/dL — ABNORMAL HIGH (ref 70–99)
Potassium: 5.8 mmol/L — ABNORMAL HIGH (ref 3.5–5.1)
Sodium: 139 mmol/L (ref 135–145)
Total Bilirubin: 0.5 mg/dL (ref 0.3–1.2)
Total Protein: 6.9 g/dL (ref 6.5–8.1)

## 2020-08-10 LAB — MAGNESIUM: Magnesium: 2.1 mg/dL (ref 1.7–2.4)

## 2020-08-10 LAB — GLUCOSE, CAPILLARY
Glucose-Capillary: 224 mg/dL — ABNORMAL HIGH (ref 70–99)
Glucose-Capillary: 210 mg/dL — ABNORMAL HIGH (ref 70–99)
Glucose-Capillary: 200 mg/dL — ABNORMAL HIGH (ref 70–99)
Glucose-Capillary: 204 mg/dL — ABNORMAL HIGH (ref 70–99)

## 2020-08-10 LAB — MRSA PCR SCREENING: MRSA by PCR: NEGATIVE

## 2020-08-10 LAB — HEMOGLOBIN A1C
Hgb A1c MFr Bld: 5.7 % — ABNORMAL HIGH (ref 4.8–5.6)
Mean Plasma Glucose: 116.89 mg/dL

## 2020-08-10 LAB — LACTIC ACID, PLASMA
Lactic Acid, Venous: 1.8 mmol/L (ref 0.5–1.9)
Lactic Acid, Venous: 1.9 mmol/L (ref 0.5–1.9)

## 2020-08-10 LAB — C-REACTIVE PROTEIN
CRP: 28.5 mg/dL — ABNORMAL HIGH (ref <1.0)
CRP: 30.6 mg/dL — ABNORMAL HIGH (ref <1.0)

## 2020-08-10 LAB — PHOSPHORUS: Phosphorus: 2.6 mg/dL (ref 2.5–4.6)

## 2020-08-10 LAB — STREP PNEUMONIAE URINARY ANTIGEN: Strep Pneumo Urinary Antigen: NEGATIVE

## 2020-08-10 LAB — FERRITIN: Ferritin: 2470 ng/mL — ABNORMAL HIGH (ref 24–336)

## 2020-08-10 MED ORDER — SODIUM CHLORIDE 0.9 % IV SOLN
500.0000 mg | INTRAVENOUS | Status: AC
  Administered 2020-08-10 – 2020-08-11 (×2): 500 mg via INTRAVENOUS
  Filled 2020-08-10 (×2): qty 500

## 2020-08-10 MED ORDER — LORAZEPAM 1 MG PO TABS
0.5000 mg | ORAL_TABLET | Freq: Four times a day (QID) | ORAL | Status: DC | PRN
  Administered 2020-08-10: 0.5 mg via ORAL
  Filled 2020-08-10: qty 1

## 2020-08-10 MED ORDER — FUROSEMIDE 10 MG/ML IJ SOLN: 20.0000 mg | Freq: Once | INTRAMUSCULAR | Status: AC

## 2020-08-10 MED ORDER — SODIUM CHLORIDE 0.9 % IV SOLN
2.0000 g | INTRAVENOUS | Status: DC
  Administered 2020-08-11 – 2020-08-14 (×5): 2 g via INTRAVENOUS
  Filled 2020-08-10 (×5): qty 2
  Filled 2020-08-10: qty 20

## 2020-08-10 MED ORDER — MIDAZOLAM HCL 2 MG/2ML IJ SOLN
INTRAMUSCULAR | Status: AC
  Filled 2020-08-10: qty 4

## 2020-08-10 MED ORDER — ROCURONIUM BROMIDE 50 MG/5ML IV SOLN
INTRAVENOUS | Status: AC
  Filled 2020-08-10: qty 1

## 2020-08-10 MED ORDER — DEXMEDETOMIDINE HCL IN NACL 400 MCG/100ML IV SOLN
0.4000 ug/kg/h | INTRAVENOUS | Status: DC
  Administered 2020-08-10: 0.9 ug/kg/h via INTRAVENOUS
  Administered 2020-08-10: 0.4 ug/kg/h via INTRAVENOUS
  Administered 2020-08-11: 0.8 ug/kg/h via INTRAVENOUS
  Administered 2020-08-11: 1.1 ug/kg/h via INTRAVENOUS
  Administered 2020-08-11: 0.8 ug/kg/h via INTRAVENOUS
  Administered 2020-08-11: 1.1 ug/kg/h via INTRAVENOUS
  Administered 2020-08-11: 1.2 ug/kg/h via INTRAVENOUS
  Filled 2020-08-10 (×7): qty 100

## 2020-08-10 MED ORDER — NICOTINE 21 MG/24HR TD PT24
21.0000 mg | MEDICATED_PATCH | Freq: Once | TRANSDERMAL | Status: AC
  Administered 2020-08-10: 21 mg via TRANSDERMAL
  Filled 2020-08-10: qty 1

## 2020-08-10 MED ORDER — INSULIN ASPART 100 UNIT/ML ~~LOC~~ SOLN
0.0000 [IU] | Freq: Every day | SUBCUTANEOUS | Status: DC
  Administered 2020-08-10: 2 [IU] via SUBCUTANEOUS
  Filled 2020-08-10: qty 1

## 2020-08-10 MED ORDER — FENTANYL CITRATE (PF) 100 MCG/2ML IJ SOLN
INTRAMUSCULAR | Status: AC
  Filled 2020-08-10: qty 4

## 2020-08-10 MED ORDER — FUROSEMIDE 10 MG/ML IJ SOLN
INTRAMUSCULAR | Status: AC
  Administered 2020-08-10: 20 mg via INTRAVENOUS
  Filled 2020-08-10: qty 2

## 2020-08-10 MED ORDER — INSULIN ASPART 100 UNIT/ML ~~LOC~~ SOLN
0.0000 [IU] | Freq: Three times a day (TID) | SUBCUTANEOUS | Status: DC
  Administered 2020-08-10 (×2): 5 [IU] via SUBCUTANEOUS
  Administered 2020-08-11 (×3): 3 [IU] via SUBCUTANEOUS
  Filled 2020-08-10 (×5): qty 1

## 2020-08-10 MED ORDER — DEXAMETHASONE SODIUM PHOSPHATE 10 MG/ML IJ SOLN
6.0000 mg | INTRAMUSCULAR | Status: DC
  Administered 2020-08-10 – 2020-08-18 (×8): 6 mg via INTRAVENOUS
  Filled 2020-08-10 (×10): qty 0.6

## 2020-08-10 MED ORDER — SODIUM ZIRCONIUM CYCLOSILICATE 5 G PO PACK
5.0000 g | PACK | Freq: Once | ORAL | Status: AC
  Administered 2020-08-10: 5 g via ORAL
  Filled 2020-08-10: qty 1

## 2020-08-10 MED ORDER — CHLORHEXIDINE GLUCONATE CLOTH 2 % EX PADS
6.0000 | MEDICATED_PAD | Freq: Every day | CUTANEOUS | Status: DC
  Administered 2020-08-10 – 2020-09-22 (×43): 6 via TOPICAL

## 2020-08-10 NOTE — Progress Notes (Signed)
Assisted tele visit to patient with wife, [name].  Maryelizabeth Rowan, RN

## 2020-08-10 NOTE — Progress Notes (Signed)
Notified bedside nurse of need to draw repeat lactic acid. 

## 2020-08-10 NOTE — Progress Notes (Signed)
Pt unable to tolerate bipap due to anxiety.  Discussed options with Marda Stalker, NP at bedside. Increased rate of precedex to improve anxiety symptoms and help patient relax. Pt remains on HFNC and non-rebreather. Spoke with wife [name] and provided updates related to plan of care, pt respiratory status and anxiety management.

## 2020-08-10 NOTE — Progress Notes (Signed)
Attempted bipap with this patient. Patient extremely anxious and not tolerating. Now on back on HFNC. Will continue to monitor

## 2020-08-10 NOTE — Consult Note (Signed)
CRITICAL CARE PROGRESS NOTE    Name: SOMA ERICHSEN MRN: [id number] DOB: 07-02-1991  Referring physician: Dr Reesa Chew    LOS: 1  R  SUBJECTIVE FINDINGS & SIGNIFICANT EVENTS    Patient description:  29 yo male recent hospitalization for perforated diverticulitis s/p colostomy with reversal and leakage of anastomosis with s/p revision now acutely hypoxemic with COVID19 induced severe ARDS.  Lines/tubes : Closed System Drain 1 Right Abdomen Bulb (JP) 19 Fr. (Active)     Colostomy LLQ (Active)  Stoma Assessment Pink;Red 08/10/20 0944  Peristomal Assessment Intact 08/10/20 0944    Microbiology/Sepsis markers: Results for orders placed or performed during the hospital encounter of 08/09/20  Culture, blood (Routine X 2) w Reflex to ID Panel     Status: None (Preliminary result)   Collection Time: 08/09/20  9:06 PM   Specimen: BLOOD  Result Value Ref Range Status   Specimen Description BLOOD RIGHT ARM  Final   Special Requests   Final    BOTTLES DRAWN AEROBIC AND ANAEROBIC Blood Culture adequate volume   Culture   Final    NO GROWTH < 12 HOURS Performed at [facility], [address], [place], [place] [address]    Report Status PENDING  Incomplete  Culture, blood (Routine X 2) w Reflex to ID Panel     Status: None (Preliminary result)   Collection Time: 08/09/20  9:06 PM   Specimen: BLOOD  Result Value Ref Range Status   Specimen Description BLOOD RIGHT [facility]  Final   Special Requests   Final    BOTTLES DRAWN AEROBIC AND ANAEROBIC Blood Culture adequate volume   Culture   Final    NO GROWTH < 12 HOURS Performed at [facility], [address] [place], [place] [address]    Report Status PENDING  Incomplete  Resp Panel by RT-PCR (Flu A&B, Covid) Nasopharyngeal Swab     Status:  Abnormal   Collection Time: 08/09/20  9:11 PM   Specimen: Nasopharyngeal Swab; Nasopharyngeal(NP) swabs in vial transport medium  Result Value Ref Range Status   SARS Coronavirus 2 by RT PCR POSITIVE (A) NEGATIVE Final    Comment: RESULT CALLED TO, READ BACK BY AND VERIFIED WITH: JANE MARTIN @2215  ON 08/09/20 SKL (NOTE) SARS-CoV-2 target nucleic acids are DETECTED.  The SARS-CoV-2 RNA is generally detectable in upper respiratory specimens during the acute phase of infection. Positive results are indicative of the presence of the identified virus, but do not rule out bacterial infection or co-infection with other pathogens not detected by the test. Clinical correlation with patient history and other diagnostic information is necessary to determine patient infection status. The expected result is Negative.  Fact Sheet for Patients: EntrepreneurPulse.com.au  Fact Sheet for Healthcare Providers: IncredibleEmployment.be  This test is not yet approved or cleared by the [place] FDA and  has been authorized for detection and/or diagnosis of SARS-CoV-2 by FDA under an Emergency Use Authorization (EUA).  This EUA will remain in effect (meaning this test can be  used) for the duration of  the COVID-19 declaration under Section 564(b)(1) of the Act, 21 U.S.C. section 360bbb-3(b)(1), unless the authorization is terminated or revoked sooner.     Influenza A by PCR NEGATIVE NEGATIVE Final   Influenza B by PCR NEGATIVE NEGATIVE Final    Comment: (NOTE) The Xpert Xpress SARS-CoV-2/FLU/RSV plus assay is intended as an aid in the diagnosis of influenza from Nasopharyngeal swab specimens and should not be used as a sole  basis for treatment. Nasal washings and aspirates are unacceptable for Xpert Xpress SARS-CoV-2/FLU/RSV testing.  Fact Sheet for Patients: EntrepreneurPulse.com.au  Fact Sheet for Healthcare  Providers: IncredibleEmployment.be  This test is not yet approved or cleared by the [place] FDA and has been authorized for detection and/or diagnosis of SARS-CoV-2 by FDA under an Emergency Use Authorization (EUA). This EUA will remain in effect (meaning this test can be used) for the duration of the COVID-19 declaration under Section 564(b)(1) of the Act, 21 U.S.C. section 360bbb-3(b)(1), unless the authorization is terminated or revoked.  Performed at [facility], [address] [place], [place] [address]   MRSA PCR Screening     Status: None   Collection Time: 08/10/20 12:40 PM   Specimen: Nasopharyngeal  Result Value Ref Range Status   MRSA by PCR NEGATIVE NEGATIVE Final    Comment:        The GeneXpert MRSA Assay (FDA approved for NASAL specimens only), is one component of a comprehensive MRSA colonization surveillance program. It is not intended to diagnose MRSA infection nor to guide or monitor treatment for MRSA infections. Performed at [facility], [address], [place], [place] [address]     Anti-infectives:  Anti-infectives (From admission, onward)   Start     Dose/Rate Route Frequency Ordered Stop   08/11/20 0100  cefTRIAXone (ROCEPHIN) 2 g in sodium chloride 0.9 % 100 mL IVPB        2 g 200 mL/hr over 30 Minutes Intravenous Every 24 hours 08/10/20 0859     08/10/20 1000  remdesivir 100 mg in sodium chloride 0.9 % 100 mL IVPB       "Followed by" Linked Group Details   100 mg 200 mL/hr over 30 Minutes Intravenous Daily 08/09/20 2236 08/14/20 0959   08/10/20 0000  cefTRIAXone (ROCEPHIN) 2 g in sodium chloride 0.9 % 100 mL IVPB        2 g 200 mL/hr over 30 Minutes Intravenous  Once 08/09/20 2352 08/10/20 0125   08/09/20 2345  levofloxacin (LEVAQUIN) IVPB 750 mg  Status:  Discontinued        750 mg 100 mL/hr over 90 Minutes Intravenous  Once 08/09/20 2339 08/09/20 2351   08/09/20 2300  remdesivir 200 mg in  sodium chloride 0.9% 250 mL IVPB       "Followed by" Linked Group Details   200 mg 580 mL/hr over 30 Minutes Intravenous Once 08/09/20 2236 08/10/20 0201   08/09/20 2300  azithromycin (ZITHROMAX) 500 mg in sodium chloride 0.9 % 250 mL IVPB        500 mg 250 mL/hr over 60 Minutes Intravenous Every 24 hours 08/10/20 0859 08/12/20 2259   08/09/20 2230  azithromycin (ZITHROMAX) 500 mg in sodium chloride 0.9 % 250 mL IVPB        500 mg 250 mL/hr over 60 Minutes Intravenous  Once 08/09/20 2227 08/10/20 0038        PAST MEDICAL HISTORY   Past Medical History:  Diagnosis Date  . Gout   . Hypertension   . Rupture of bowel [facility])      SURGICAL HISTORY   Past Surgical History:  Procedure Laterality Date  . COLONOSCOPY WITH PROPOFOL N/A 05/18/2020   Procedure: COLONOSCOPY WITH PROPOFOL;  Surgeon: Benjamine Sprague, DO;  Location: [facility] ENDOSCOPY;  Service: General;  Laterality: N/A;  . COLOSTOMY N/A 11/22/2019   Procedure: COLOSTOMY;  Surgeon: Herbert Pun, MD;  Location: [facility] ORS;  Service: General;  Laterality: N/A;  .  LAPAROTOMY N/A 11/22/2019   Procedure: EXPLORATORY LAPAROTOMY;  Surgeon: Herbert Pun, MD;  Location: [facility] ORS;  Service: General;  Laterality: N/A;  . LAPAROTOMY N/A 07/08/2020   Procedure: EXPLORATORY LAPAROTOMY;  Surgeon: Herbert Pun, MD;  Location: [facility] ORS;  Service: General;  Laterality: N/A;  . PARTIAL COLECTOMY N/A 11/22/2019   Procedure: PARTIAL COLECTOMY;  Surgeon: Herbert Pun, MD;  Location: [facility] ORS;  Service: General;  Laterality: N/A;  . XI ROBOTIC ASSISTED COLOSTOMY TAKEDOWN N/A 06/28/2020   Procedure: XI ROBOTIC ASSISTED COLOSTOMY TAKEDOWN CONVERTED TO OPEN PROCEDURE;  Surgeon: Herbert Pun, MD;  Location: [facility] ORS;  Service: General;  Laterality: N/A;     FAMILY HISTORY   Family History  Problem Relation Age of Onset  . Healthy Mother   . Healthy Father      SOCIAL HISTORY   Social History   Tobacco Use  .  Smoking status: Never Smoker  . Smokeless tobacco: Current User    Types: Chew  Vaping Use  . Vaping Use: Never used  Substance Use Topics  . Alcohol use: Yes    Comment: pint to a fifth per day per patient . Patient reports he hasn't had anything to drink in three weeks.  . Drug use: Never     MEDICATIONS   Current Medication:  Current Facility-Administered Medications:  .  0.9 %  sodium chloride infusion, 250 mL, Intravenous, PRN, Opyd, Ilene Qua, MD, Last Rate: 5 mL/hr at 08/10/20 1406, 250 mL at 08/10/20 1406 .  acetaminophen (TYLENOL) tablet 650 mg, 650 mg, Oral, Q6H PRN, Opyd, Ilene Qua, MD, 650 mg at 08/10/20 0349 .  albuterol (VENTOLIN HFA) 108 (90 Base) MCG/ACT inhaler 2 puff, 2 puff, Inhalation, Q6H PRN, Opyd, Timothy S, MD .  allopurinol (ZYLOPRIM) tablet 300 mg, 300 mg, Oral, Daily, Opyd, Ilene Qua, MD, 300 mg at 08/10/20 0955 .  azithromycin (ZITHROMAX) 500 mg in sodium chloride 0.9 % 250 mL IVPB, 500 mg, Intravenous, Q24H, Lorella Nimrod, MD .  Derrill Memo ON 08/11/2020] cefTRIAXone (ROCEPHIN) 2 g in sodium chloride 0.9 % 100 mL IVPB, 2 g, Intravenous, Q24H, Lorella Nimrod, MD .  Chlorhexidine Gluconate Cloth 2 % PADS 6 each, 6 each, Topical, Daily, Ottie Glazier, MD, 6 each at 08/10/20 1401 .  chlorpheniramine-HYDROcodone (TUSSIONEX) 10-8 MG/5ML suspension 5 mL, 5 mL, Oral, Q12H PRN, Opyd, Timothy S, MD .  dexmedetomidine (PRECEDEX) 400 MCG/100ML (4 mcg/mL) infusion, 0.4-1.2 mcg/kg/hr, Intravenous, Titrated, Lilianah Buffin, MD, Last Rate: 11.79 mL/hr at 08/10/20 1651, 0.4 mcg/kg/hr at 08/10/20 1651 .  enoxaparin (LOVENOX) injection 60 mg, 0.5 mg/kg, Subcutaneous, Q2200, Opyd, Timothy S, MD .  fentaNYL (SUBLIMAZE) 100 MCG/2ML injection, , , ,  .  guaiFENesin-dextromethorphan (ROBITUSSIN DM) 100-10 MG/5ML syrup 10 mL, 10 mL, Oral, Q4H PRN, Opyd, Ilene Qua, MD .  HYDROcodone-acetaminophen (NORCO/VICODIN) 5-325 MG per tablet 1-2 tablet, 1-2 tablet, Oral, Q4H PRN, Opyd, Timothy S,  MD .  insulin aspart (novoLOG) injection 0-15 Units, 0-15 Units, Subcutaneous, TID WC, Lorella Nimrod, MD, 5 Units at 08/10/20 1611 .  insulin aspart (novoLOG) injection 0-5 Units, 0-5 Units, Subcutaneous, QHS, Amin, Sumayya, MD .  LORazepam (ATIVAN) tablet 0.5 mg, 0.5 mg, Oral, Q6H PRN, Lorella Nimrod, MD, 0.5 mg at 08/10/20 1254 .  methylPREDNISolone sodium succinate (SOLU-MEDROL) 125 mg/2 mL injection 118.125 mg, 1 mg/kg, Intravenous, Q12H, 118.125 mg at 08/10/20 1250 **FOLLOWED BY** [START ON 08/13/2020] predniSONE (DELTASONE) tablet 50 mg, 50 mg, Oral, Daily, Opyd, Ilene Qua, MD .  midazolam (VERSED) 2 MG/2ML injection, , , ,  .  nicotine (NICODERM CQ - dosed in mg/24 hours) patch 21 mg, 21 mg, Transdermal, Once, Opyd, Ilene Qua, MD, 21 mg at 08/10/20 0154 .  ondansetron (ZOFRAN) tablet 4 mg, 4 mg, Oral, Q6H PRN **OR** ondansetron (ZOFRAN) injection 4 mg, 4 mg, Intravenous, Q6H PRN, Opyd, Ilene Qua, MD .  Margrett Rud remdesivir 200 mg in sodium chloride 0.9% 250 mL IVPB, 200 mg, Intravenous, Once, Stopped at 08/10/20 0201 **FOLLOWED BY** remdesivir 100 mg in sodium chloride 0.9 % 100 mL IVPB, 100 mg, Intravenous, Daily, Opyd, Timothy S, MD, Last Rate: 200 mL/hr at 08/10/20 1247, 100 mg at 08/10/20 1247 .  rocuronium (ZEMURON) 50 MG/5ML injection, , , ,     ALLERGIES   Amoxicillin    REVIEW OF SYSTEMS     Unable to obtain due to sedation on precedex drip  PHYSICAL EXAMINATION   Vital Signs: Temp:  [96.8 F (36 C)-98.6 F (37 C)] 96.8 F (36 C) (12/22 0900) Pulse Rate:  [77-124] 93 (12/22 0900) Resp:  [18-39] 28 (12/22 0900) BP: (85-124)/(43-83) 122/82 (12/22 0900) SpO2:  [66 %-100 %] 92 % (12/22 1526) FiO2 (%):  [75 %-93 %] 93 % (12/22 1526) Weight:  [117.9 kg] 117.9 kg (12/21 2036)  GENERAL:age appropriate sedated HEAD: Normocephalic, atraumatic.  EYES: Pupils equal, round, reactive to light.  No scleral icterus.  MOUTH: Moist mucosal membrane. NECK: Supple. No  thyromegaly. No nodules. No JVD.  PULMONARY: ronchi bilaterally  CARDIOVASCULAR: S1 and S2. Regular rate and rhythm. No murmurs, rubs, or gallops.  GASTROINTESTINAL: Soft, nontender, non-distended. No masses. Positive bowel sounds. No hepatosplenomegaly.  MUSCULOSKELETAL: No swelling, clubbing, or edema.  NEUROLOGIC: Mild distress due to acute illness SKIN:intact,warm,dry   PERTINENT DATA     Infusions: . sodium chloride 250 mL (08/10/20 1406)  . azithromycin    . [START ON 08/11/2020] cefTRIAXone (ROCEPHIN)  IV    . dexmedetomidine (PRECEDEX) IV infusion 0.4 mcg/kg/hr (08/10/20 1651)  . remdesivir 100 mg in NS 100 mL 100 mg (08/10/20 1247)   Scheduled Medications: . allopurinol  300 mg Oral Daily  . Chlorhexidine Gluconate Cloth  6 each Topical Daily  . enoxaparin (LOVENOX) injection  0.5 mg/kg Subcutaneous Q2200  . fentaNYL      . insulin aspart  0-15 Units Subcutaneous TID WC  . insulin aspart  0-5 Units Subcutaneous QHS  . methylPREDNISolone (SOLU-MEDROL) injection  1 mg/kg Intravenous Q12H   Followed by  . [START ON 08/13/2020] predniSONE  50 mg Oral Daily  . midazolam      . nicotine  21 mg Transdermal Once  . rocuronium       PRN Medications: sodium chloride, acetaminophen, albuterol, chlorpheniramine-HYDROcodone, guaiFENesin-dextromethorphan, HYDROcodone-acetaminophen, LORazepam, ondansetron **OR** ondansetron (ZOFRAN) IV Hemodynamic parameters:   Intake/Output: 12/21 0701 - 12/22 0700 In: 1000 [IV Piggyback:1000] Out: -   Ventilator  Settings: FiO2 (%):  [75 %-93 %] 93 %    LAB RESULTS:  Basic Metabolic Panel: Recent Labs  Lab 08/09/20 2111 08/10/20 0433  NA 133* 139  K 4.4 5.8*  CL 95* 100  CO2 22 29  GLUCOSE 157* 287*  BUN 7 9  CREATININE 1.10 0.69  CALCIUM 8.4* 8.9  MG  --  2.1  PHOS  --  2.6   Liver Function Tests: Recent Labs  Lab 08/09/20 2111 08/10/20 0433  AST 53* 41  ALT 32 28  ALKPHOS 53 49  BILITOT 1.5* 0.5  PROT 7.6 6.9   ALBUMIN 3.5 2.8*   No results for input(s): LIPASE, AMYLASE  in the last 168 hours. No results for input(s): AMMONIA in the last 168 hours. CBC: Recent Labs  Lab 08/09/20 2111 08/10/20 0433  WBC 12.3* 5.8  NEUTROABS 10.7* 5.1  HGB 12.8* 12.1*  HCT 40.2 36.9*  MCV 90.7 92.5  PLT 160 112*   Cardiac Enzymes: No results for input(s): CKTOTAL, CKMB, CKMBINDEX, TROPONINI in the last 168 hours. BNP: Invalid input(s): POCBNP CBG: Recent Labs  Lab 08/10/20 0914 08/10/20 1251 08/10/20 1551  GLUCAP 224* 210* 200*       IMAGING RESULTS:  Imaging: DG Chest Port 1 View  Result Date: 08/09/2020 CLINICAL DATA:  29 year old male with concern for sepsis. EXAM: PORTABLE CHEST 1 VIEW COMPARISON:  Chest radiograph dated 07/13/2020. FINDINGS: Bilateral confluent hazy densities most consistent with multifocal pneumonia, possibly viral or atypical in etiology including COVID-19. Clinical correlation is recommended. There is no pleural effusion pneumothorax. Mild cardiomegaly. No acute osseous pathology. IMPRESSION: Multifocal pneumonia. Clinical correlation and follow-up recommended. Electronically Signed   By: Anner Crete M.D.   On: 08/09/2020 21:37   @PROBHOSP @ DG Chest Port 1 View  Result Date: 08/09/2020 CLINICAL DATA:  29 year old male with concern for sepsis. EXAM: PORTABLE CHEST 1 VIEW COMPARISON:  Chest radiograph dated 07/13/2020. FINDINGS: Bilateral confluent hazy densities most consistent with multifocal pneumonia, possibly viral or atypical in etiology including COVID-19. Clinical correlation is recommended. There is no pleural effusion pneumothorax. Mild cardiomegaly. No acute osseous pathology. IMPRESSION: Multifocal pneumonia. Clinical correlation and follow-up recommended. Electronically Signed   By: Anner Crete M.D.   On: 08/09/2020 21:37     ASSESSMENT AND PLAN    -Multidisciplinary rounds held today  Acute Hypoxic Respiratory Failure Acute COVID19 pneumonia  with severe ARDS -Remdesevir antiviral - pharmacy protocol 5 d -vitamin C -zinc -decadron 6mg  IV daily  -Diuresis - Lasix 40 IV daily - monitor UOP - utilize external urinary catheter if possible -Self prone if patient can tolerate  -encourage to use IS and Acapella device for bronchopulmonary hygiene when able -consider Actemra if CRP increments >20 -d/c hepatotoxic medications while on remdesevir -supportive care with ICU telemetry monitoring -PT/OT when possible -procalcitonin, CRP and ferritin trending -baricitinib per protocol -shaved beard placed on BIPAP due to failure on HFNC - patient high risk for ETT - he is agreeable to intubation if absolutely necessary     Perforated diverticulitis   - s/p colostomy     - may present problem with proning protocol for COVID    - reviewed care plan with surgeon today - Dr Windell Moment - appreciate input ICU telemetry  monitoring  ID -continue IV abx as prescibed -follow up cultures  GI/Nutrition GI PROPHYLAXIS as indicated DIET-->TF's as tolerated Constipation protocol as indicated  ENDO - ICU hypoglycemic\Hyperglycemia protocol -check FSBS per protocol   ELECTROLYTES -follow labs as needed -replace as needed -pharmacy consultation   DVT/GI PRX ordered -SCDs  TRANSFUSIONS AS NEEDED MONITOR FSBS ASSESS the need for LABS as needed   Critical care provider statement:    Critical care time (minutes):  109   Critical care time was exclusive of:  Separately billable procedures and treating other patients   Critical care was necessary to treat or prevent imminent or life-threatening deterioration of the following conditions:  severe acute hypoxemic respiratory failure, severe ARDS, perforated diverticulitis.    Critical care was time spent personally by me on the following activities:  Development of treatment plan with patient or surrogate, discussions with consultants, evaluation of patient's response to treatment,  examination  of patient, obtaining history from patient or surrogate, ordering and performing treatments and interventions, ordering and review of laboratory studies and re-evaluation of patient's condition.  I assumed direction of critical care for this patient from another provider in my specialty: no    This document was prepared using Dragon voice recognition software and may include unintentional dictation errors.    Ottie Glazier, M.D.  Division of [place]

## 2020-08-10 NOTE — Plan of Care (Signed)
  Problem: Education: Goal: Knowledge of General Education information will improve Description: Including pain rating scale, medication(s)/side effects and non-pharmacologic comfort measures Outcome: Progressing   Problem: Clinical Measurements: Goal: Ability to maintain clinical measurements within normal limits will improve Outcome: Progressing Goal: Will remain free from infection Outcome: Progressing Goal: Diagnostic test results will improve Outcome: Progressing Goal: Cardiovascular complication will be avoided Outcome: Progressing   Problem: Nutrition: Goal: Adequate nutrition will be maintained Outcome: Progressing   Problem: Elimination: Goal: Will not experience complications related to bowel motility Outcome: Progressing Goal: Will not experience complications related to urinary retention Outcome: Progressing   Problem: Pain Managment: Goal: General experience of comfort will improve Outcome: Progressing   Problem: Safety: Goal: Ability to remain free from injury will improve Outcome: Progressing   Problem: Skin Integrity: Goal: Risk for impaired skin integrity will decrease Outcome: Progressing

## 2020-08-10 NOTE — Progress Notes (Signed)
PROGRESS NOTE    [place] ART  [id number] DOB: [birth year] DOA: 08/09/2020 PCP: [name], MD   Brief Narrative: Taken from H&P. Craig [name] is a 29 y.o. male with medical history significant for hypertension and perforated diverticulitis status post partial colectomy and anastomotic leak last month status post emergent anastomosis resection and end colostomy, now presenting to emergency department for evaluation of worsening shortness of breath, cough, fever, and lightheadedness.  Patient reports that he developed cough and malaise 1 week ago, tested positive for COVID-19, was beginning to feel little better, but then worsened acutely over the past day.  He has had worsening shortness of breath, worsening cough, high fever, and lightheadedness in the past day. Over 19 PCR positive.  Chest x-ray with bilateral multiple infiltrated.  Procalcitonin at 0.91.  Started on antibiotics along with remdesivir and steroid. Worsening hypoxia requiring heated high flow nasal cannula. Patient is unvaccinated.  Subjective: Patient was on nonrebreather when seen during morning rounds as his equipment for heated high flow was malfunctioning, stating it was throwing very cold air.  Was feeling claustrophobic and would like to try heated high flow again.  Multiple family members sick at home include wife and kids.  Assessment & Plan:   Principal Problem:   Acute hypoxemic respiratory failure due to COVID-19 [facility]) Active Problems:   Severe sepsis (HCC)   AKI (acute kidney injury) ([place])   Hypertension  Sepsis secondary to COVID-19 pneumonia/acute hypoxic respiratory failure.  Concern of superadded bacterial infection with elevated procalcitonin.  Strep pneumo antigen negative.  MRSA PCR negative.  Continue to require higher level of oxygen. Markedly elevated inflammatory markers, CRP at 30 and D-dimer of 1571. Lactic acidosis resolved. -Continue with ceftriaxone and  Zithromax. -Continue with remdesivir-day 2 -Continue with steroid. -Continue with supportive care and supplements. -Continue with heated high flow to keep the saturation above 88%.  Hyperkalemia.  Potassium at 5.8. -Give him Lokelma x1. -Monitor potassium  AKI.  Resolved.  Hyperglycemia.  Most likely steroid-induced.  A1c of 5.7. -Monitor with SSI.  Hypertension.  Blood pressure within goal.  Home dose of antihypertensives were held due to softer blood pressure on admission. -Continue to hold antihypertensives.  Morbid obesity. Body mass index is 35.25 kg/m.  This will complicate overall prognosis.  Objective: Vitals:   08/10/20 0830 08/10/20 0900 08/10/20 1045 08/10/20 1526  BP: 113/72 122/82    Pulse: 77 93    Resp: (!) 29 (!) 28    Temp:  (!) 96.8 F (36 C)    TempSrc:  Axillary    SpO2: 96% 95% 91% 92%  Weight:      Height:        Intake/Output Summary (Last 24 hours) at 08/10/2020 1633 Last data filed at 08/09/2020 2242 Gross per 24 hour  Intake 1000 ml  Output --  Net 1000 ml   Filed Weights   08/09/20 2036  Weight: 117.9 kg    Examination:  General exam: Appears calm and comfortable  Respiratory system: Clear to auscultation. Respiratory effort normal. Cardiovascular system: S1 & S2 heard, RRR.  Gastrointestinal system: Soft, nontender, nondistended, bowel sounds positive. Central nervous system: Alert and oriented. No focal neurological deficits.Symmetric 5 x 5 power. Extremities: No edema, no cyanosis, pulses intact and symmetrical. Psychiatry: Judgement and insight appear normal. Mood & affect appropriate.    DVT prophylaxis: Lovenox Code Status: Full Family Communication: Discussed with patient Disposition Plan:  Status is: Inpatient  Remains inpatient appropriate because:Inpatient level of  care appropriate due to severity of illness   Dispo: The patient is from: Home              Anticipated d/c is to: Home              Anticipated  d/c date is: > 3 days              Patient currently is not medically stable to d/c.   Consultants:   PCCM  Procedures:  Antimicrobials:  Ceftriaxone Zithromax  Data Reviewed: I have personally reviewed following labs and imaging studies  CBC: Recent Labs  Lab 08/09/20 2111 08/10/20 0433  WBC 12.3* 5.8  NEUTROABS 10.7* 5.1  HGB 12.8* 12.1*  HCT 40.2 36.9*  MCV 90.7 92.5  PLT 160 [id number]*   Basic Metabolic Panel: Recent Labs  Lab 08/09/20 2111 08/10/20 0433  NA 133* 139  K 4.4 5.8*  CL 95* 100  CO2 22 29  GLUCOSE 157* 287*  BUN 7 9  CREATININE 1.10 0.69  CALCIUM 8.4* 8.9  MG  --  2.1  PHOS  --  2.6   GFR: Estimated Creatinine Clearance: 177.3 mL/min (by C-G formula based on SCr of 0.69 mg/dL). Liver Function Tests: Recent Labs  Lab 08/09/20 2111 08/10/20 0433  AST 53* 41  ALT 32 28  ALKPHOS 53 49  BILITOT 1.5* 0.5  PROT 7.6 6.9  ALBUMIN 3.5 2.8*   No results for input(s): LIPASE, AMYLASE in the last 168 hours. No results for input(s): AMMONIA in the last 168 hours. Coagulation Profile: Recent Labs  Lab 08/09/20 2111  INR 1.0   Cardiac Enzymes: No results for input(s): CKTOTAL, CKMB, CKMBINDEX, TROPONINI in the last 168 hours. BNP (last 3 results) No results for input(s): PROBNP in the last 8760 hours. HbA1C: Recent Labs    08/10/20 0433  HGBA1C 5.7*   CBG: Recent Labs  Lab 08/10/20 0914 08/10/20 1251 08/10/20 1551  GLUCAP 224* 210* 200*   Lipid Profile: No results for input(s): CHOL, HDL, LDLCALC, TRIG, CHOLHDL, LDLDIRECT in the last 72 hours. Thyroid Function Tests: No results for input(s): TSH, T4TOTAL, FREET4, T3FREE, THYROIDAB in the last 72 hours. Anemia Panel: Recent Labs    08/10/20 0433  FERRITIN 2,470*   Sepsis Labs: Recent Labs  Lab 08/09/20 0154 08/09/20 2106 08/09/20 2111 08/10/20 0433  PROCALCITON  --   --  0.92 1.22  LATICACIDVEN 1.8 3.3*  --  1.9    Recent Results (from the past 240 hour(s))  Culture,  blood (Routine X 2) w Reflex to ID Panel     Status: None (Preliminary result)   Collection Time: 08/09/20  9:06 PM   Specimen: BLOOD  Result Value Ref Range Status   Specimen Description BLOOD RIGHT ARM  Final   Special Requests   Final    BOTTLES DRAWN AEROBIC AND ANAEROBIC Blood Culture adequate volume   Culture   Final    NO GROWTH < 12 HOURS Performed at [facility], [address] [place], [place] [address]    Report Status PENDING  Incomplete  Culture, blood (Routine X 2) w Reflex to ID Panel     Status: None (Preliminary result)   Collection Time: 08/09/20  9:06 PM   Specimen: BLOOD  Result Value Ref Range Status   Specimen Description BLOOD RIGHT [facility]  Final   Special Requests   Final    BOTTLES DRAWN AEROBIC AND ANAEROBIC Blood Culture adequate volume   Culture   Final  NO GROWTH < 12 HOURS Performed at [facility], [address] [place], [place] [address]    Report Status PENDING  Incomplete  Resp Panel by RT-PCR (Flu A&B, Covid) Nasopharyngeal Swab     Status: Abnormal   Collection Time: 08/09/20  9:11 PM   Specimen: Nasopharyngeal Swab; Nasopharyngeal(NP) swabs in vial transport medium  Result Value Ref Range Status   SARS Coronavirus 2 by RT PCR POSITIVE (A) NEGATIVE Final    Comment: RESULT CALLED TO, READ BACK BY AND VERIFIED WITH: JANE MARTIN @2215  ON 08/09/20 SKL (NOTE) SARS-CoV-2 target nucleic acids are DETECTED.  The SARS-CoV-2 RNA is generally detectable in upper respiratory specimens during the acute phase of infection. Positive results are indicative of the presence of the identified virus, but do not rule out bacterial infection or co-infection with other pathogens not detected by the test. Clinical correlation with patient history and other diagnostic information is necessary to determine patient infection status. The expected result is Negative.  Fact Sheet for Patients: EntrepreneurPulse.com.au  Fact  Sheet for Healthcare Providers: IncredibleEmployment.be  This test is not yet approved or cleared by the [place] FDA and  has been authorized for detection and/or diagnosis of SARS-CoV-2 by FDA under an Emergency Use Authorization (EUA).  This EUA will remain in effect (meaning this test can be  used) for the duration of  the COVID-19 declaration under Section 564(b)(1) of the Act, 21 U.S.C. section 360bbb-3(b)(1), unless the authorization is terminated or revoked sooner.     Influenza A by PCR NEGATIVE NEGATIVE Final   Influenza B by PCR NEGATIVE NEGATIVE Final    Comment: (NOTE) The Xpert Xpress SARS-CoV-2/FLU/RSV plus assay is intended as an aid in the diagnosis of influenza from Nasopharyngeal swab specimens and should not be used as a sole basis for treatment. Nasal washings and aspirates are unacceptable for Xpert Xpress SARS-CoV-2/FLU/RSV testing.  Fact Sheet for Patients: EntrepreneurPulse.com.au  Fact Sheet for Healthcare Providers: IncredibleEmployment.be  This test is not yet approved or cleared by the [place] FDA and has been authorized for detection and/or diagnosis of SARS-CoV-2 by FDA under an Emergency Use Authorization (EUA). This EUA will remain in effect (meaning this test can be used) for the duration of the COVID-19 declaration under Section 564(b)(1) of the Act, 21 U.S.C. section 360bbb-3(b)(1), unless the authorization is terminated or revoked.  Performed at [facility], [address] [place], [place] [address]   MRSA PCR Screening     Status: None   Collection Time: 08/10/20 12:40 PM   Specimen: Nasopharyngeal  Result Value Ref Range Status   MRSA by PCR NEGATIVE NEGATIVE Final    Comment:        The GeneXpert MRSA Assay (FDA approved for NASAL specimens only), is one component of a comprehensive MRSA colonization surveillance program. It is not intended to diagnose  MRSA infection nor to guide or monitor treatment for MRSA infections. Performed at [facility], [address], [place], [place] [address]      Radiology Studies: DG Chest Sloan 1 View  Result Date: 08/09/2020 CLINICAL DATA:  29 year old male with concern for sepsis. EXAM: PORTABLE CHEST 1 VIEW COMPARISON:  Chest radiograph dated 07/13/2020. FINDINGS: Bilateral confluent hazy densities most consistent with multifocal pneumonia, possibly viral or atypical in etiology including COVID-19. Clinical correlation is recommended. There is no pleural effusion pneumothorax. Mild cardiomegaly. No acute osseous pathology. IMPRESSION: Multifocal pneumonia. Clinical correlation and follow-up recommended. Electronically Signed   By: Milas Hock  Radparvar M.D.   On: 08/09/2020 21:37    Scheduled Meds: . allopurinol  300 mg Oral Daily  . Chlorhexidine Gluconate Cloth  6 each Topical Daily  . enoxaparin (LOVENOX) injection  0.5 mg/kg Subcutaneous Q2200  . insulin aspart  0-15 Units Subcutaneous TID WC  . insulin aspart  0-5 Units Subcutaneous QHS  . methylPREDNISolone (SOLU-MEDROL) injection  1 mg/kg Intravenous Q12H   Followed by  . [START ON 08/13/2020] predniSONE  50 mg Oral Daily  . nicotine  21 mg Transdermal Once   Continuous Infusions: . sodium chloride 250 mL (08/10/20 1406)  . azithromycin    . [START ON 08/11/2020] cefTRIAXone (ROCEPHIN)  IV    . dexmedetomidine (PRECEDEX) IV infusion    . remdesivir 100 mg in NS 100 mL 100 mg (08/10/20 1247)     LOS: 1 day   Time spent: 45 minutes.  Lorella Nimrod, MD [facility]  If 7PM-7AM, please contact night-coverage [email or web address]  08/10/2020, 4:33 PM   This record has been created using Systems analyst. Errors have been sought and corrected,but may not always be located. Such creation errors do not reflect on the standard of care.

## 2020-08-10 NOTE — ED Notes (Signed)
Colostomy emptied. Urinal emptied with 1000 ml output

## 2020-08-10 NOTE — ED Notes (Signed)
Patient advised to breathe through his nose to increase his oxygen saturation

## 2020-08-11 ENCOUNTER — Inpatient Hospital Stay: Payer: No Typology Code available for payment source

## 2020-08-11 DIAGNOSIS — U071 COVID-19: Secondary | ICD-10-CM | POA: Diagnosis not present

## 2020-08-11 DIAGNOSIS — J9601 Acute respiratory failure with hypoxia: Secondary | ICD-10-CM | POA: Diagnosis not present

## 2020-08-11 LAB — COMPREHENSIVE METABOLIC PANEL
ALT: 24 U/L (ref 0–44)
AST: 28 U/L (ref 15–41)
Albumin: 2.9 g/dL — ABNORMAL LOW (ref 3.5–5.0)
Alkaline Phosphatase: 46 U/L (ref 38–126)
Anion gap: 12 (ref 5–15)
BUN: 15 mg/dL (ref 6–20)
CO2: 26 mmol/L (ref 22–32)
Calcium: 8.9 mg/dL (ref 8.9–10.3)
Chloride: 102 mmol/L (ref 98–111)
Creatinine, Ser: 0.5 mg/dL — ABNORMAL LOW (ref 0.61–1.24)
GFR, Estimated: >60 mL/min (ref >60)
Glucose, Bld: 194 mg/dL — ABNORMAL HIGH (ref 70–99)
Potassium: 4.4 mmol/L (ref 3.5–5.1)
Sodium: 140 mmol/L (ref 135–145)
Total Bilirubin: 0.7 mg/dL (ref 0.3–1.2)
Total Protein: 6.8 g/dL (ref 6.5–8.1)

## 2020-08-11 LAB — CBC WITH DIFFERENTIAL/PLATELET
Abs Immature Granulocytes: 0.06 10*3/uL (ref 0.00–0.07)
Basophils Absolute: 0 10*3/uL (ref 0.0–0.1)
Basophils Relative: 0 %
Eosinophils Absolute: 0 10*3/uL (ref 0.0–0.5)
Eosinophils Relative: 0 %
HCT: 36.9 % — ABNORMAL LOW (ref 39.0–52.0)
Hemoglobin: 11.8 g/dL — ABNORMAL LOW (ref 13.0–17.0)
Immature Granulocytes: 1 %
Lymphocytes Relative: 8 %
Lymphs Abs: 0.6 10*3/uL — ABNORMAL LOW (ref 0.7–4.0)
MCH: 29.3 pg (ref 26.0–34.0)
MCHC: 32 g/dL (ref 30.0–36.0)
MCV: 91.6 fL (ref 80.0–100.0)
Monocytes Absolute: 0.3 10*3/uL (ref 0.1–1.0)
Monocytes Relative: 4 %
Neutro Abs: 6.7 10*3/uL (ref 1.7–7.7)
Neutrophils Relative %: 87 %
Platelets: 133 10*3/uL — ABNORMAL LOW (ref 150–400)
RBC: 4.03 MIL/uL — ABNORMAL LOW (ref 4.22–5.81)
RDW: 14.9 % (ref 11.5–15.5)
WBC: 7.6 10*3/uL (ref 4.0–10.5)
nRBC: 0 % (ref 0.0–0.2)

## 2020-08-11 LAB — BLOOD GAS, ARTERIAL
Acid-Base Excess: 0.9 mmol/L (ref 0.0–2.0)
Bicarbonate: 27.1 mmol/L (ref 20.0–28.0)
FIO2: 1
MECHVT: 500 mL
O2 Saturation: 99.8 %
PEEP: 16 cmH2O
Patient temperature: 37
RATE: 20 resp/min
pCO2 arterial: 49 mmHg — ABNORMAL HIGH (ref 32.0–48.0)
pH, Arterial: 7.35 (ref 7.350–7.450)
pO2, Arterial: 257 mmHg — ABNORMAL HIGH (ref 83.0–108.0)

## 2020-08-11 LAB — FERRITIN: Ferritin: 2393 ng/mL — ABNORMAL HIGH (ref 24–336)

## 2020-08-11 LAB — GLUCOSE, CAPILLARY
Glucose-Capillary: 187 mg/dL — ABNORMAL HIGH (ref 70–99)
Glucose-Capillary: 189 mg/dL — ABNORMAL HIGH (ref 70–99)
Glucose-Capillary: 205 mg/dL — ABNORMAL HIGH (ref 70–99)
Glucose-Capillary: 203 mg/dL — ABNORMAL HIGH (ref 70–99)
Glucose-Capillary: 170 mg/dL — ABNORMAL HIGH (ref 70–99)

## 2020-08-11 LAB — TRIGLYCERIDES: Triglycerides: 194 mg/dL — ABNORMAL HIGH (ref <150)

## 2020-08-11 LAB — C-REACTIVE PROTEIN: CRP: 20.7 mg/dL — ABNORMAL HIGH (ref <1.0)

## 2020-08-11 LAB — URINE CULTURE: Culture: NO GROWTH

## 2020-08-11 LAB — FIBRIN DERIVATIVES D-DIMER (ARMC ONLY): Fibrin derivatives D-dimer (ARMC): 1310.91 ng/mL (FEU) — ABNORMAL HIGH (ref 0.00–499.00)

## 2020-08-11 LAB — LEGIONELLA PNEUMOPHILA SEROGP 1 UR AG: L. pneumophila Serogp 1 Ur Ag: NEGATIVE

## 2020-08-11 LAB — PROCALCITONIN: Procalcitonin: 0.94 ng/mL

## 2020-08-11 IMAGING — DX DG ABDOMEN 1V
1 series · 1 of 1 positions shown · non-contrast
Comparison: None.

CLINICAL DATA: Check gastric catheter placement

EXAM:
ABDOMEN - 1 VIEW

[abdomen supine]
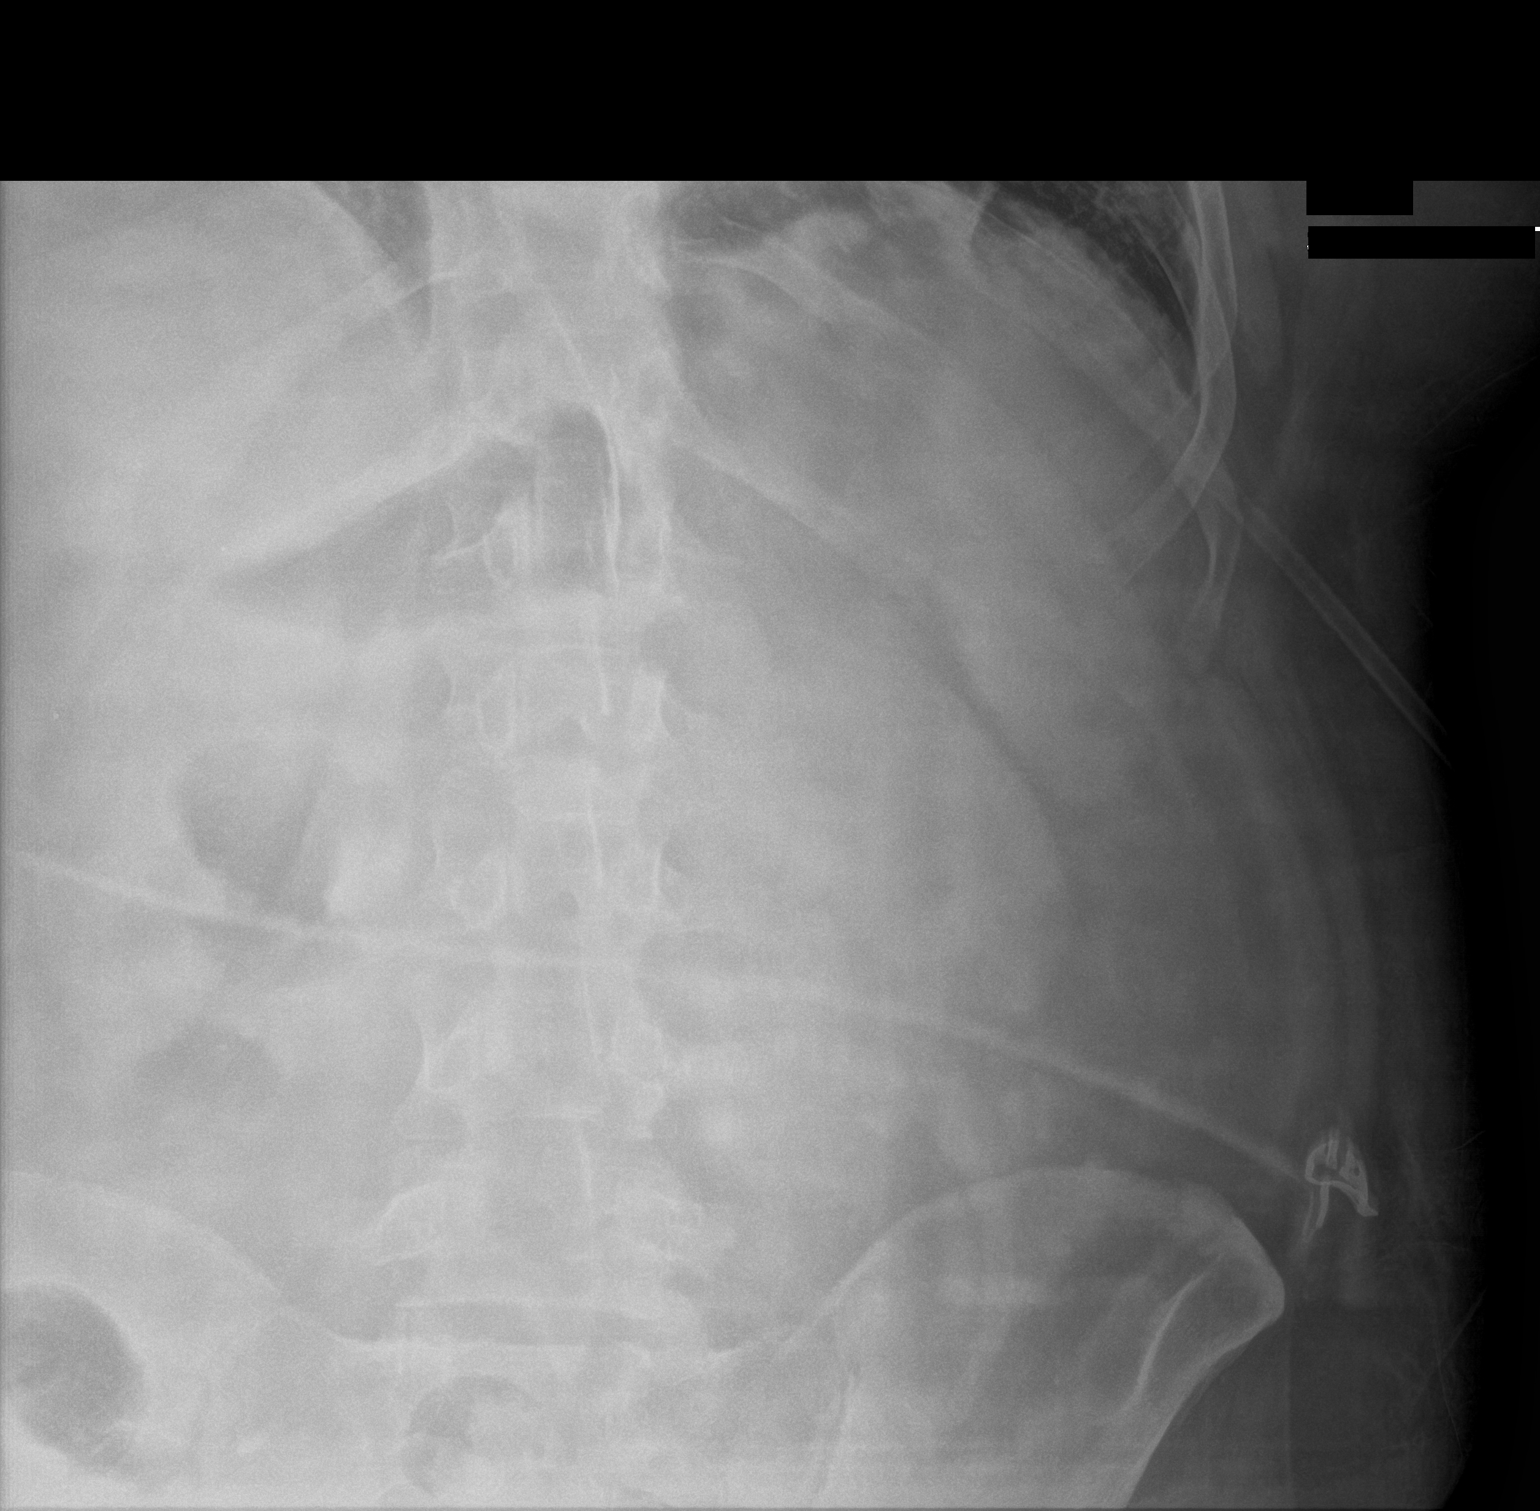

[1 of 1 positions shown; findings below may reference images not displayed]

FINDINGS: Gastric catheter is noted within the stomach. Proximal side port
lies at the gastroesophageal junction. This could be advanced deeper
into the stomach. No obstructive changes are seen.
IMPRESSION: Gastric catheter as described. This could be advanced further into
the stomach.

## 2020-08-11 IMAGING — DX DG CHEST 1V
1 series · 1 of 1 positions shown · non-contrast
Comparison: [DATE]

CLINICAL DATA: Check endotracheal tube placement

EXAM:
CHEST  1 VIEW

[chest ap]
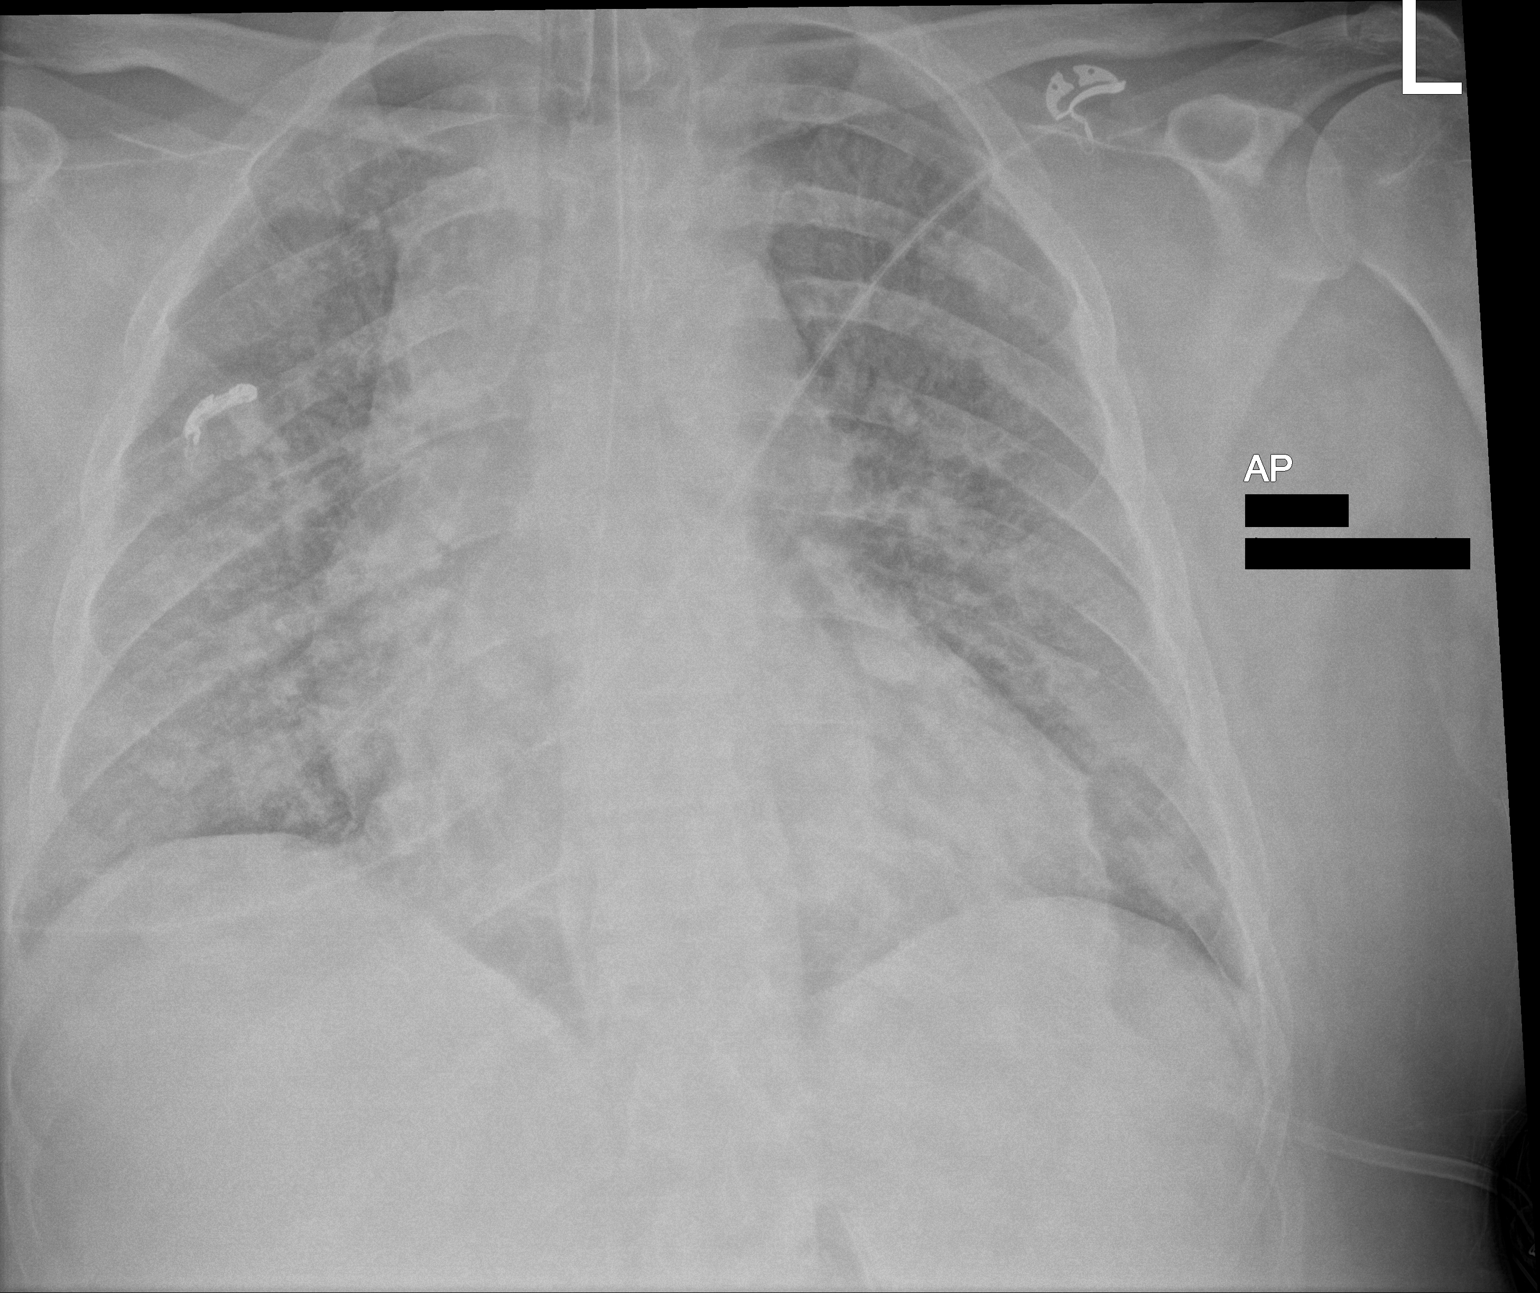

[1 of 1 positions shown; findings below may reference images not displayed]

FINDINGS: Endotracheal tube is noted just below the thoracic inlet. Gastric
catheter extends into the stomach. Cardiac shadow remains enlarged.
Central vascular congestion and edema is noted. Bony abnormality is
seen.
IMPRESSION: Tubes and lines as described.

Vascular congestion with edema.

## 2020-08-11 MED ORDER — MIDAZOLAM 50MG/50ML (1MG/ML) PREMIX INFUSION
0.5000 mg/h | INTRAVENOUS | Status: DC
  Administered 2020-08-11: 4 mg/h via INTRAVENOUS
  Filled 2020-08-11: qty 50

## 2020-08-11 MED ORDER — ROCURONIUM BROMIDE 50 MG/5ML IV SOLN
INTRAVENOUS | Status: AC
  Administered 2020-08-11: 40 mg via INTRAVENOUS
  Filled 2020-08-11: qty 1

## 2020-08-11 MED ORDER — FENTANYL CITRATE (PF) 100 MCG/2ML IJ SOLN
150.0000 ug | Freq: Once | INTRAMUSCULAR | Status: AC
Start: 2020-08-11 — End: 2020-08-11

## 2020-08-11 MED ORDER — PROPOFOL 1000 MG/100ML IV EMUL
INTRAVENOUS | Status: AC
  Administered 2020-08-11: 10 ug/kg/min via INTRAVENOUS
  Filled 2020-08-11: qty 100

## 2020-08-11 MED ORDER — FENTANYL 2500MCG IN NS 250ML (10MCG/ML) PREMIX INFUSION
0.0000 ug/h | INTRAVENOUS | Status: DC
  Administered 2020-08-11: 375 ug/h via INTRAVENOUS
  Administered 2020-08-12 (×2): 175 ug/h via INTRAVENOUS
  Administered 2020-08-13: 250 ug/h via INTRAVENOUS
  Administered 2020-08-13: 200 ug/h via INTRAVENOUS
  Administered 2020-08-14: 400 ug/h via INTRAVENOUS
  Administered 2020-08-14: 250 ug/h via INTRAVENOUS
  Administered 2020-08-15 – 2020-08-16 (×6): 300 ug/h via INTRAVENOUS
  Administered 2020-08-17: 350 ug/h via INTRAVENOUS
  Administered 2020-08-17: 300 ug/h via INTRAVENOUS
  Administered 2020-08-17: 350 ug/h via INTRAVENOUS
  Administered 2020-08-18 (×3): 300 ug/h via INTRAVENOUS
  Administered 2020-08-19 – 2020-08-22 (×11): 350 ug/h via INTRAVENOUS
  Administered 2020-08-22: 400 ug/h via INTRAVENOUS
  Administered 2020-08-22 (×2): 350 ug/h via INTRAVENOUS
  Administered 2020-08-23 – 2020-08-24 (×7): 400 ug/h via INTRAVENOUS
  Filled 2020-08-11 (×40): qty 250

## 2020-08-11 MED ORDER — ROCURONIUM BROMIDE 50 MG/5ML IV SOLN: 40.0000 mg | Freq: Once | INTRAVENOUS | Status: AC

## 2020-08-11 MED ORDER — ORAL CARE MOUTH RINSE
15.0000 mL | OROMUCOSAL | Status: DC
  Administered 2020-08-11 – 2020-09-08 (×269): 15 mL via OROMUCOSAL

## 2020-08-11 MED ORDER — FENTANYL CITRATE (PF) 100 MCG/2ML IJ SOLN
INTRAMUSCULAR | Status: AC
  Administered 2020-08-11: 150 ug via INTRAVENOUS
  Filled 2020-08-11: qty 4

## 2020-08-11 MED ORDER — SODIUM CHLORIDE 0.9% FLUSH
10.0000 mL | Freq: Two times a day (BID) | INTRAVENOUS | Status: DC
  Administered 2020-08-11: 10 mL
  Administered 2020-08-12: 20 mL
  Administered 2020-08-12 – 2020-08-16 (×7): 10 mL
  Administered 2020-08-16 – 2020-08-17 (×2): 20 mL
  Administered 2020-08-18 – 2020-08-24 (×13): 10 mL
  Administered 2020-08-24: 20 mL
  Administered 2020-08-25 – 2020-08-30 (×10): 10 mL

## 2020-08-11 MED ORDER — MIDAZOLAM HCL 2 MG/2ML IJ SOLN
INTRAMUSCULAR | Status: AC
  Administered 2020-08-11: 2 mg via INTRAVENOUS
  Filled 2020-08-11: qty 2

## 2020-08-11 MED ORDER — PROPOFOL 1000 MG/100ML IV EMUL
5.0000 ug/kg/min | INTRAVENOUS | Status: DC
  Administered 2020-08-11: 80 ug/kg/min via INTRAVENOUS
  Administered 2020-08-11: 70 ug/kg/min via INTRAVENOUS
  Administered 2020-08-11 (×2): 80 ug/kg/min via INTRAVENOUS
  Administered 2020-08-12: 60 ug/kg/min via INTRAVENOUS
  Administered 2020-08-12: 50 ug/kg/min via INTRAVENOUS
  Administered 2020-08-12 (×2): 45 ug/kg/min via INTRAVENOUS
  Administered 2020-08-12: 50 ug/kg/min via INTRAVENOUS
  Administered 2020-08-12: 45 ug/kg/min via INTRAVENOUS
  Administered 2020-08-12 (×2): 50 ug/kg/min via INTRAVENOUS
  Administered 2020-08-13: 45 ug/kg/min via INTRAVENOUS
  Administered 2020-08-13 (×2): 50 ug/kg/min via INTRAVENOUS
  Administered 2020-08-13: 70 ug/kg/min via INTRAVENOUS
  Administered 2020-08-13: 45 ug/kg/min via INTRAVENOUS
  Administered 2020-08-13 (×2): 50 ug/kg/min via INTRAVENOUS
  Administered 2020-08-13 – 2020-08-14 (×2): 70 ug/kg/min via INTRAVENOUS
  Administered 2020-08-14: 60 ug/kg/min via INTRAVENOUS
  Administered 2020-08-14: 65 ug/kg/min via INTRAVENOUS
  Administered 2020-08-14: 60 ug/kg/min via INTRAVENOUS
  Administered 2020-08-14 (×3): 70 ug/kg/min via INTRAVENOUS
  Filled 2020-08-11 (×27): qty 100

## 2020-08-11 MED ORDER — LORAZEPAM 2 MG/ML IJ SOLN: 4.0000 mg | Freq: Once | INTRAMUSCULAR | Status: AC

## 2020-08-11 MED ORDER — FENTANYL 2500MCG IN NS 250ML (10MCG/ML) PREMIX INFUSION
INTRAVENOUS | Status: AC
  Administered 2020-08-11: 200 ug/h via INTRAVENOUS
  Filled 2020-08-11: qty 250

## 2020-08-11 MED ORDER — LORAZEPAM 2 MG/ML IJ SOLN
INTRAMUSCULAR | Status: AC
  Administered 2020-08-11: 4 mg
  Filled 2020-08-11: qty 2

## 2020-08-11 MED ORDER — CHLORHEXIDINE GLUCONATE 0.12% ORAL RINSE (MEDLINE KIT)
15.0000 mL | Freq: Two times a day (BID) | OROMUCOSAL | Status: DC
  Administered 2020-08-11 – 2020-09-08 (×55): 15 mL via OROMUCOSAL

## 2020-08-11 MED ORDER — INSULIN ASPART 100 UNIT/ML ~~LOC~~ SOLN
0.0000 [IU] | SUBCUTANEOUS | Status: DC
  Administered 2020-08-11: 7 [IU] via SUBCUTANEOUS
  Administered 2020-08-12 (×3): 4 [IU] via SUBCUTANEOUS
  Administered 2020-08-12 (×2): 3 [IU] via SUBCUTANEOUS
  Administered 2020-08-12 – 2020-08-14 (×4): 4 [IU] via SUBCUTANEOUS
  Administered 2020-08-14: 3 [IU] via SUBCUTANEOUS
  Administered 2020-08-14: 4 [IU] via SUBCUTANEOUS
  Administered 2020-08-14 – 2020-08-15 (×3): 3 [IU] via SUBCUTANEOUS
  Administered 2020-08-15: 7 [IU] via SUBCUTANEOUS
  Administered 2020-08-15 – 2020-08-16 (×5): 4 [IU] via SUBCUTANEOUS
  Administered 2020-08-16: 15 [IU] via SUBCUTANEOUS
  Administered 2020-08-16: 4 [IU] via SUBCUTANEOUS
  Administered 2020-08-16: 15 [IU] via SUBCUTANEOUS
  Administered 2020-08-17: 7 [IU] via SUBCUTANEOUS
  Administered 2020-08-17: 11 [IU] via SUBCUTANEOUS
  Administered 2020-08-17 (×3): 7 [IU] via SUBCUTANEOUS
  Administered 2020-08-17: 11 [IU] via SUBCUTANEOUS
  Administered 2020-08-18: 4 [IU] via SUBCUTANEOUS
  Administered 2020-08-18: 7 [IU] via SUBCUTANEOUS
  Administered 2020-08-18 (×2): 11 [IU] via SUBCUTANEOUS
  Administered 2020-08-18: 4 [IU] via SUBCUTANEOUS
  Administered 2020-08-19: 7 [IU] via SUBCUTANEOUS
  Administered 2020-08-19: 3 [IU] via SUBCUTANEOUS
  Administered 2020-08-19: 11 [IU] via SUBCUTANEOUS
  Administered 2020-08-19 (×3): 4 [IU] via SUBCUTANEOUS
  Administered 2020-08-20: 7 [IU] via SUBCUTANEOUS
  Administered 2020-08-20: 20 [IU] via SUBCUTANEOUS
  Administered 2020-08-20: 11 [IU] via SUBCUTANEOUS
  Administered 2020-08-20: 7 [IU] via SUBCUTANEOUS
  Administered 2020-08-20: 11 [IU] via SUBCUTANEOUS
  Administered 2020-08-21: 15 [IU] via SUBCUTANEOUS
  Filled 2020-08-11 (×49): qty 1

## 2020-08-11 MED ORDER — SODIUM CHLORIDE 0.9% FLUSH: 10.0000 mL | INTRAVENOUS | Status: DC | PRN

## 2020-08-11 MED ORDER — MIDAZOLAM HCL 2 MG/2ML IJ SOLN: 2.0000 mg | Freq: Once | INTRAMUSCULAR | Status: AC

## 2020-08-11 NOTE — Procedures (Signed)
Endotracheal Intubation: Patient required placement of an artificial airway secondary to Respiratory Failure  Consent: Emergent.   Hand washing performed prior to starting the procedure.   Medications administered for sedation prior to procedure:  Midazolam 2 mg IV,  rocuronium 40 mg IV, Fentanyl 150 mcg IV.    A time out procedure was called and correct patient, name, & ID confirmed. Needed supplies and equipment were assembled and checked to include ETT, 10 ml syringe, Glidescope, Mac and Miller blades, suction, oxygen and bag mask valve, end tidal CO2 monitor.   Patient was positioned to align the mouth and pharynx to facilitate visualization of the glottis.   Heart rate, SpO2 and blood pressure was continuously monitored during the procedure. Pre-oxygenation was conducted prior to intubation and endotracheal tube was placed through the vocal cords into the trachea.     The artificial airway was placed under direct visualization via glidescope route using a 8 ETT on the first attempt.  ETT was secured at 23 cm mark.  Placement was confirmed by auscuitation of lungs with good breath sounds bilaterally and no stomach sounds.  Condensation was noted on endotracheal tube.   Pulse ox 98%.  CO2 detector in place with appropriate color change.   Complications: None .     Chest radiograph ordered and pending.   Comments: OGT placed via glidescope.   Ottie Glazier, M.D.  [facility] [place]

## 2020-08-11 NOTE — Progress Notes (Signed)
Pt now agreeable to wearing Bipap and currently tolerating Bipap well.  He states he needs more time to think about mechanical intubation.  Updated pts wife regarding plan of care and pt condition all questions were answered.  Marda Stalker, [place] Pager [phone] (please enter 7 digits) PCCM Consult Pager [phone] (please enter 7 digits)

## 2020-08-11 NOTE — Progress Notes (Signed)
Pt remains hypoxic with O2 sats 84-91%, have discussed goals of treatment with the pt who is currently alert/oriented multiple times throughout the shift.  He continues to refuse Bipap, and states he does not want to be intubated right now.  He states he needs more time to think about mechanical intubation before making a decision.  Will continue to monitor and assess pt.  Marda Stalker, [place] Pager [phone] (please enter 7 digits) PCCM Consult Pager [phone] (please enter 7 digits)

## 2020-08-11 NOTE — Progress Notes (Signed)
PROGRESS NOTE    Craig [name]  [id number] DOB: March 23, 1991 DOA: 08/09/2020 PCP: [name], MD   Brief Narrative: Taken from H&P. Craig [name] is a 29 y.o. male with medical history significant for hypertension and perforated diverticulitis status post partial colectomy and anastomotic leak last month status post emergent anastomosis resection and end colostomy, now presenting to emergency department for evaluation of worsening shortness of breath, cough, fever, and lightheadedness.  Patient reports that he developed cough and malaise 1 week ago, tested positive for COVID-19, was beginning to feel little better, but then worsened acutely over the past day.  He has had worsening shortness of breath, worsening cough, high fever, and lightheadedness in the past day. Over 19 PCR positive.  Chest x-ray with bilateral multiple infiltrated.  Procalcitonin at 0.91.  Started on antibiotics along with remdesivir and steroid. Worsening hypoxia requiring heated high flow nasal cannula. Patient is unvaccinated.  Subjective: Patient was quite lethargic and tachypneic on BiPAP when seen today. Discussed about intubation but he wants to see if he can remain on BiPAP for a little longer, he wants to avoid intubation but agrees to proceed if that will be the last option.  Assessment & Plan:   Principal Problem:   Acute hypoxemic respiratory failure due to COVID-19 [facility]) Active Problems:   Severe sepsis (HCC)   AKI (acute kidney injury) ([place])   Hypertension  Sepsis secondary to COVID-19 pneumonia/acute hypoxic respiratory failure.  Concern of superadded bacterial infection with elevated procalcitonin.  Strep pneumo antigen negative.  MRSA PCR negative.  Continue to require higher level of oxygen, currently on BiPAP. Markedly elevated inflammatory markers, started improving. Lactic acidosis resolved. -Continue with ceftriaxone and Zithromax. -Continue with remdesivir-day  3 -Continue with steroid. -Continue with supportive care and supplements. -Continue with BiPAP. -PCCM is following-very high risk for intubation as he had marked tachypnea and appears quite lethargic. He was also started on Precedex to see if that will help with his tachypnea.  Hyperkalemia.  Resolved with Lokelma. -Monitor potassium  AKI.  Resolved.  Hyperglycemia.  Most likely steroid-induced.  A1c of 5.7. -Monitor with SSI.  Hypertension.  Blood pressure within goal.  Home dose of antihypertensives were held due to softer blood pressure on admission. -Continue to hold antihypertensives.  Morbid obesity. Body mass index is 35.25 kg/m.  This will complicate overall prognosis.  Objective: Vitals:   08/11/20 0800 08/11/20 0813 08/11/20 1358 08/11/20 1458  BP: 133/84     Pulse: 61     Resp: (!) 34     Temp: (!) 97.1 F (36.2 C)     TempSrc: Axillary     SpO2: 92% 98% 90% 100%  Weight:      Height:        Intake/Output Summary (Last 24 hours) at 08/11/2020 1542 Last data filed at 08/11/2020 0600 Gross per 24 hour  Intake 763.66 ml  Output 1775 ml  Net -1011.34 ml   Filed Weights   08/09/20 2036  Weight: 117.9 kg    Examination:  General.  Morbidly obese gentleman with BiPAP, appears lethargic.  Pulmonary.  Lungs clear bilaterally, tachypneic with increased work of breathing. CV.  Regular rate and rhythm, no JVD, rub or murmur. Abdomen.  Soft, nontender, nondistended, BS positive. CNS.  Alert and oriented x3.  No focal neurologic deficit. Extremities.  No edema, no cyanosis, pulses intact and symmetrical. Psychiatry.  Judgment and insight appears normal.    DVT prophylaxis: Lovenox Code Status: Full Family Communication:  Wife was updated on phone Disposition Plan:  Status is: Inpatient  Remains inpatient appropriate because:Inpatient level of care appropriate due to severity of illness   Dispo: The patient is from: Home              Anticipated d/c is  to: Home              Anticipated d/c date is: > 3 days              Patient currently is not medically stable to d/c.   Consultants:   PCCM  Procedures:  Antimicrobials:  Ceftriaxone Zithromax  Data Reviewed: I have personally reviewed following labs and imaging studies  CBC: Recent Labs  Lab 08/09/20 2111 08/10/20 0433 08/11/20 0636  WBC 12.3* 5.8 7.6  NEUTROABS 10.7* 5.1 6.7  HGB 12.8* 12.1* 11.8*  HCT 40.2 36.9* 36.9*  MCV 90.7 92.5 91.6  PLT 160 112* [id number]*   Basic Metabolic Panel: Recent Labs  Lab 08/09/20 2111 08/10/20 0433 08/11/20 0636  NA 133* 139 140  K 4.4 5.8* 4.4  CL 95* 100 102  CO2 22 29 26   GLUCOSE 157* 287* 194*  BUN 7 9 15   CREATININE 1.10 0.69 0.50*  CALCIUM 8.4* 8.9 8.9  MG  --  2.1  --   PHOS  --  2.6  --    GFR: Estimated Creatinine Clearance: 177.3 mL/min (A) (by C-G formula based on SCr of 0.5 mg/dL (L)). Liver Function Tests: Recent Labs  Lab 08/09/20 2111 08/10/20 0433 08/11/20 0636  AST 53* 41 28  ALT 32 28 24  ALKPHOS 53 49 46  BILITOT 1.5* 0.5 0.7  PROT 7.6 6.9 6.8  ALBUMIN 3.5 2.8* 2.9*   No results for input(s): LIPASE, AMYLASE in the last 168 hours. No results for input(s): AMMONIA in the last 168 hours. Coagulation Profile: Recent Labs  Lab 08/09/20 2111  INR 1.0   Cardiac Enzymes: No results for input(s): CKTOTAL, CKMB, CKMBINDEX, TROPONINI in the last 168 hours. BNP (last 3 results) No results for input(s): PROBNP in the last 8760 hours. HbA1C: Recent Labs    08/10/20 0433  HGBA1C 5.7*   CBG: Recent Labs  Lab 08/10/20 1251 08/10/20 1551 08/10/20 2201 08/11/20 0747 08/11/20 1144  GLUCAP 210* 200* 204* 187* 189*   Lipid Profile: Recent Labs    08/11/20 0535  TRIG 194*   Thyroid Function Tests: No results for input(s): TSH, T4TOTAL, FREET4, T3FREE, THYROIDAB in the last 72 hours. Anemia Panel: Recent Labs    08/10/20 0433 08/11/20 0636  FERRITIN 2,470* 2,393*   Sepsis Labs: Recent  Labs  Lab 08/09/20 0154 08/09/20 2106 08/09/20 2111 08/10/20 0433 08/11/20 0636  PROCALCITON  --   --  0.92 1.22 0.94  LATICACIDVEN 1.8 3.3*  --  1.9  --     Recent Results (from the past 240 hour(s))  Culture, blood (Routine X 2) w Reflex to ID Panel     Status: None (Preliminary result)   Collection Time: 08/09/20  9:06 PM   Specimen: BLOOD  Result Value Ref Range Status   Specimen Description BLOOD RIGHT ARM  Final   Special Requests   Final    BOTTLES DRAWN AEROBIC AND ANAEROBIC Blood Culture adequate volume   Culture   Final    NO GROWTH 1 DAY Performed at [facility], [address] [place], [place] [address]    Report Status PENDING  Incomplete  Culture, blood (Routine X 2) w Reflex  to ID Panel     Status: None (Preliminary result)   Collection Time: 08/09/20  9:06 PM   Specimen: BLOOD  Result Value Ref Range Status   Specimen Description BLOOD RIGHT [facility]  Final   Special Requests   Final    BOTTLES DRAWN AEROBIC AND ANAEROBIC Blood Culture adequate volume   Culture   Final    NO GROWTH 1 DAY Performed at [facility], [address], [place], [place] [address]    Report Status PENDING  Incomplete  Resp Panel by RT-PCR (Flu A&B, Covid) Nasopharyngeal Swab     Status: Abnormal   Collection Time: 08/09/20  9:11 PM   Specimen: Nasopharyngeal Swab; Nasopharyngeal(NP) swabs in vial transport medium  Result Value Ref Range Status   SARS Coronavirus 2 by RT PCR POSITIVE (A) NEGATIVE Final    Comment: RESULT CALLED TO, READ BACK BY AND VERIFIED WITH: JANE MARTIN @2215  ON 08/09/20 SKL (NOTE) SARS-CoV-2 target nucleic acids are DETECTED.  The SARS-CoV-2 RNA is generally detectable in upper respiratory specimens during the acute phase of infection. Positive results are indicative of the presence of the identified virus, but do not rule out bacterial infection or co-infection with other pathogens not detected by the test. Clinical correlation with  patient history and other diagnostic information is necessary to determine patient infection status. The expected result is Negative.  Fact Sheet for Patients: EntrepreneurPulse.com.au  Fact Sheet for Healthcare Providers: IncredibleEmployment.be  This test is not yet approved or cleared by the [place] FDA and  has been authorized for detection and/or diagnosis of SARS-CoV-2 by FDA under an Emergency Use Authorization (EUA).  This EUA will remain in effect (meaning this test can be  used) for the duration of  the COVID-19 declaration under Section 564(b)(1) of the Act, 21 U.S.C. section 360bbb-3(b)(1), unless the authorization is terminated or revoked sooner.     Influenza A by PCR NEGATIVE NEGATIVE Final   Influenza B by PCR NEGATIVE NEGATIVE Final    Comment: (NOTE) The Xpert Xpress SARS-CoV-2/FLU/RSV plus assay is intended as an aid in the diagnosis of influenza from Nasopharyngeal swab specimens and should not be used as a sole basis for treatment. Nasal washings and aspirates are unacceptable for Xpert Xpress SARS-CoV-2/FLU/RSV testing.  Fact Sheet for Patients: EntrepreneurPulse.com.au  Fact Sheet for Healthcare Providers: IncredibleEmployment.be  This test is not yet approved or cleared by the [place] FDA and has been authorized for detection and/or diagnosis of SARS-CoV-2 by FDA under an Emergency Use Authorization (EUA). This EUA will remain in effect (meaning this test can be used) for the duration of the COVID-19 declaration under Section 564(b)(1) of the Act, 21 U.S.C. section 360bbb-3(b)(1), unless the authorization is terminated or revoked.  Performed at [facility], [address] [place], [place] [address]   Urine culture     Status: None   Collection Time: 08/10/20  4:33 AM   Specimen: In/Out Cath Urine  Result Value Ref Range Status   Specimen Description    Final    IN/OUT CATH URINE Performed at [facility], [address], [place], [place] [address]    Special Requests   Final    NONE Performed at [facility], [address], [place], [place] [address]    Culture   Final    NO GROWTH Performed at [facility], [place] [address] [place],  [address]    Report Status 08/11/2020 FINAL  Final  MRSA PCR  Screening     Status: None   Collection Time: 08/10/20 12:40 PM   Specimen: Nasopharyngeal  Result Value Ref Range Status   MRSA by PCR NEGATIVE NEGATIVE Final    Comment:        The GeneXpert MRSA Assay (FDA approved for NASAL specimens only), is one component of a comprehensive MRSA colonization surveillance program. It is not intended to diagnose MRSA infection nor to guide or monitor treatment for MRSA infections. Performed at [facility], [address] [place], [place] [address]      Radiology Studies: DG Chest 1 View  Result Date: 08/11/2020 CLINICAL DATA:  Check endotracheal tube placement EXAM: CHEST  1 VIEW COMPARISON:  08/10/2019 FINDINGS: Endotracheal tube is noted just below the thoracic inlet. Gastric catheter extends into the stomach. Cardiac shadow remains enlarged. Central vascular congestion and edema is noted. Bony abnormality is seen. IMPRESSION: Tubes and lines as described. Vascular congestion with edema. Electronically Signed   By: Inez Catalina M.D.   On: 08/11/2020 15:38   DG Chest Port 1 View  Result Date: 08/09/2020 CLINICAL DATA:  29 year old male with concern for sepsis. EXAM: PORTABLE CHEST 1 VIEW COMPARISON:  Chest radiograph dated 07/13/2020. FINDINGS: Bilateral confluent hazy densities most consistent with multifocal pneumonia, possibly viral or atypical in etiology including COVID-19. Clinical correlation is recommended. There is no pleural effusion pneumothorax. Mild cardiomegaly. No acute osseous pathology. IMPRESSION: Multifocal pneumonia. Clinical  correlation and follow-up recommended. Electronically Signed   By: Anner Crete M.D.   On: 08/09/2020 21:37    Scheduled Meds:  allopurinol  300 mg Oral Daily   Chlorhexidine Gluconate Cloth  6 each Topical Daily   dexamethasone (DECADRON) injection  6 mg Intravenous Q24H   enoxaparin (LOVENOX) injection  0.5 mg/kg Subcutaneous Q2200   insulin aspart  0-15 Units Subcutaneous TID WC   insulin aspart  0-5 Units Subcutaneous QHS   LORazepam       Continuous Infusions:  sodium chloride Stopped (08/10/20 2217)   azithromycin Stopped (08/10/20 2317)   cefTRIAXone (ROCEPHIN)  IV Stopped (08/11/20 0126)   dexmedetomidine (PRECEDEX) IV infusion 1.1 mcg/kg/hr (08/11/20 1225)   fentaNYL infusion INTRAVENOUS 200 mcg/hr (08/11/20 1524)   midazolam     propofol (DIPRIVAN) infusion 10 mcg/kg/min (08/11/20 1501)   remdesivir 100 mg in NS 100 mL 100 mg (08/11/20 0910)     LOS: 2 days   Time spent: 40 minutes.  Lorella Nimrod, MD [facility]  If 7PM-7AM, please contact night-coverage [email or web address]  08/11/2020, 3:42 PM   This record has been created using Systems analyst. Errors have been sought and corrected,but may not always be located. Such creation errors do not reflect on the standard of care.

## 2020-08-11 NOTE — Progress Notes (Signed)
CRITICAL CARE PROGRESS NOTE    Name: Craig [name] MRN: [id number] DOB: May 02, 1991  Referring physician: Dr Reesa Chew    LOS: 2  R  SUBJECTIVE FINDINGS & SIGNIFICANT EVENTS    Patient description:  29 yo male recent hospitalization for perforated diverticulitis s/p colostomy with reversal and leakage of anastomosis with s/p revision now acutely hypoxemic with COVID19 induced severe ARDS.    08/11/20-patient continued to decline with worsening respiratory status despite 100%Fio2 on BIPAP and HFNC failure.  He stated that he feels he is dying and cannot breathe.  I discussed with wife [name] earlier today that patient is critically ill may need ETT, she is agreeable and thankful for care. I specifically discussed and explained intubation and mechanical ventilation to patient and he wishes to proceed.    Lines/tubes : Closed System Drain 1 Right Abdomen Bulb (JP) 19 Fr. (Active)     Colostomy LLQ (Active)  Stoma Assessment Pink;Red 08/10/20 0944  Peristomal Assessment Intact 08/10/20 0944    Microbiology/Sepsis markers: Results for orders placed or performed during the hospital encounter of 08/09/20  Culture, blood (Routine X 2) w Reflex to ID Panel     Status: None (Preliminary result)   Collection Time: 08/09/20  9:06 PM   Specimen: BLOOD  Result Value Ref Range Status   Specimen Description BLOOD RIGHT ARM  Final   Special Requests   Final    BOTTLES DRAWN AEROBIC AND ANAEROBIC Blood Culture adequate volume   Culture   Final    NO GROWTH 1 DAY Performed at [facility], [address] [place], [place] [address]    Report Status PENDING  Incomplete  Culture, blood (Routine X 2) w Reflex to ID Panel     Status: None (Preliminary result)   Collection Time: 08/09/20  9:06 PM   Specimen:  BLOOD  Result Value Ref Range Status   Specimen Description BLOOD RIGHT [facility]  Final   Special Requests   Final    BOTTLES DRAWN AEROBIC AND ANAEROBIC Blood Culture adequate volume   Culture   Final    NO GROWTH 1 DAY Performed at [facility], [address], [place], [place] [address]    Report Status PENDING  Incomplete  Resp Panel by RT-PCR (Flu A&B, Covid) Nasopharyngeal Swab     Status: Abnormal   Collection Time: 08/09/20  9:11 PM   Specimen: Nasopharyngeal Swab; Nasopharyngeal(NP) swabs in vial transport medium  Result Value Ref Range Status   SARS Coronavirus 2 by RT PCR POSITIVE (A) NEGATIVE Final    Comment: RESULT CALLED TO, READ BACK BY AND VERIFIED WITH: JANE MARTIN @2215  ON 08/09/20 SKL (NOTE) SARS-CoV-2 target nucleic acids are DETECTED.  The SARS-CoV-2 RNA is generally detectable in upper respiratory specimens during the acute phase of infection. Positive results are indicative of the presence of the identified virus, but do not rule out bacterial infection or co-infection with other pathogens not detected by the test. Clinical correlation with patient history and other diagnostic information is necessary to determine patient infection status. The expected result is Negative.  Fact Sheet for Patients: EntrepreneurPulse.com.au  Fact Sheet for Healthcare Providers: IncredibleEmployment.be  This test is not yet approved or cleared by the [place] FDA and  has been authorized for detection and/or diagnosis of SARS-CoV-2 by FDA under an Emergency Use Authorization (EUA).  This EUA will remain in effect (meaning this test can be  used) for the duration of  the COVID-19 declaration under Section  564(b)(1) of the Act, 21 U.S.C. section 360bbb-3(b)(1), unless the authorization is terminated or revoked sooner.     Influenza A by PCR NEGATIVE NEGATIVE Final   Influenza B by PCR NEGATIVE NEGATIVE Final    Comment:  (NOTE) The Xpert Xpress SARS-CoV-2/FLU/RSV plus assay is intended as an aid in the diagnosis of influenza from Nasopharyngeal swab specimens and should not be used as a sole basis for treatment. Nasal washings and aspirates are unacceptable for Xpert Xpress SARS-CoV-2/FLU/RSV testing.  Fact Sheet for Patients: EntrepreneurPulse.com.au  Fact Sheet for Healthcare Providers: IncredibleEmployment.be  This test is not yet approved or cleared by the [place] FDA and has been authorized for detection and/or diagnosis of SARS-CoV-2 by FDA under an Emergency Use Authorization (EUA). This EUA will remain in effect (meaning this test can be used) for the duration of the COVID-19 declaration under Section 564(b)(1) of the Act, 21 U.S.C. section 360bbb-3(b)(1), unless the authorization is terminated or revoked.  Performed at [facility], [address] [place], [place] [address]   Urine culture     Status: None   Collection Time: 08/10/20  4:33 AM   Specimen: In/Out Cath Urine  Result Value Ref Range Status   Specimen Description   Final    IN/OUT CATH URINE Performed at [facility], [address], [place], [place] [address]    Special Requests   Final    NONE Performed at [facility], [address] [place], [place] [address]    Culture   Final    NO GROWTH Performed at [facility], [place] [address], [place], [place] [address]    Report Status 08/11/2020 FINAL  Final  MRSA PCR Screening     Status: None   Collection Time: 08/10/20 12:40 PM   Specimen: Nasopharyngeal  Result Value Ref Range Status   MRSA by PCR NEGATIVE NEGATIVE Final    Comment:        The GeneXpert MRSA Assay (FDA approved for NASAL specimens only), is one component of a comprehensive MRSA colonization surveillance program. It is not intended to diagnose MRSA infection nor to guide or monitor treatment for MRSA  infections. Performed at [facility], [address], [place], [place] [address]     Anti-infectives:  Anti-infectives (From admission, onward)   Start     Dose/Rate Route Frequency Ordered Stop   08/11/20 0100  cefTRIAXone (ROCEPHIN) 2 g in sodium chloride 0.9 % 100 mL IVPB        2 g 200 mL/hr over 30 Minutes Intravenous Every 24 hours 08/10/20 0859     08/10/20 1000  remdesivir 100 mg in sodium chloride 0.9 % 100 mL IVPB       "Followed by" Linked Group Details   100 mg 200 mL/hr over 30 Minutes Intravenous Daily 08/09/20 2236 08/14/20 0959   08/10/20 0000  cefTRIAXone (ROCEPHIN) 2 g in sodium chloride 0.9 % 100 mL IVPB        2 g 200 mL/hr over 30 Minutes Intravenous  Once 08/09/20 2352 08/10/20 0125   08/09/20 2345  levofloxacin (LEVAQUIN) IVPB 750 mg  Status:  Discontinued        750 mg 100 mL/hr over 90 Minutes Intravenous  Once 08/09/20 2339 08/09/20 2351   08/09/20 2300  remdesivir 200 mg in sodium chloride 0.9% 250 mL IVPB       "Followed by" Linked Group Details   200 mg 580 mL/hr over 30 Minutes Intravenous Once  08/09/20 2236 08/10/20 0201   08/09/20 2300  azithromycin (ZITHROMAX) 500 mg in sodium chloride 0.9 % 250 mL IVPB        500 mg 250 mL/hr over 60 Minutes Intravenous Every 24 hours 08/10/20 0859 08/12/20 2259   08/09/20 2230  azithromycin (ZITHROMAX) 500 mg in sodium chloride 0.9 % 250 mL IVPB        500 mg 250 mL/hr over 60 Minutes Intravenous  Once 08/09/20 2227 08/10/20 0038        PAST MEDICAL HISTORY   Past Medical History:  Diagnosis Date  . Gout   . Hypertension   . Rupture of bowel [facility])      SURGICAL HISTORY   Past Surgical History:  Procedure Laterality Date  . COLONOSCOPY WITH PROPOFOL N/A 05/18/2020   Procedure: COLONOSCOPY WITH PROPOFOL;  Surgeon: Benjamine Sprague, DO;  Location: [facility] ENDOSCOPY;  Service: General;  Laterality: N/A;  . COLOSTOMY N/A 11/22/2019   Procedure: COLOSTOMY;  Surgeon: Herbert Pun, MD;   Location: [facility] ORS;  Service: General;  Laterality: N/A;  . LAPAROTOMY N/A 11/22/2019   Procedure: EXPLORATORY LAPAROTOMY;  Surgeon: Herbert Pun, MD;  Location: [facility] ORS;  Service: General;  Laterality: N/A;  . LAPAROTOMY N/A 07/08/2020   Procedure: EXPLORATORY LAPAROTOMY;  Surgeon: Herbert Pun, MD;  Location: [facility] ORS;  Service: General;  Laterality: N/A;  . PARTIAL COLECTOMY N/A 11/22/2019   Procedure: PARTIAL COLECTOMY;  Surgeon: Herbert Pun, MD;  Location: [facility] ORS;  Service: General;  Laterality: N/A;  . XI ROBOTIC ASSISTED COLOSTOMY TAKEDOWN N/A 06/28/2020   Procedure: XI ROBOTIC ASSISTED COLOSTOMY TAKEDOWN CONVERTED TO OPEN PROCEDURE;  Surgeon: Herbert Pun, MD;  Location: [facility] ORS;  Service: General;  Laterality: N/A;     FAMILY HISTORY   Family History  Problem Relation Age of Onset  . Healthy Mother   . Healthy Father      SOCIAL HISTORY   Social History   Tobacco Use  . Smoking status: Never Smoker  . Smokeless tobacco: Current User    Types: Chew  Vaping Use  . Vaping Use: Never used  Substance Use Topics  . Alcohol use: Yes    Comment: pint to a fifth per day per patient . Patient reports he hasn't had anything to drink in three weeks.  . Drug use: Never     MEDICATIONS   Current Medication:  Current Facility-Administered Medications:  .  0.9 %  sodium chloride infusion, 250 mL, Intravenous, PRN, Opyd, Ilene Qua, MD, Stopped at 08/10/20 2217 .  acetaminophen (TYLENOL) tablet 650 mg, 650 mg, Oral, Q6H PRN, Opyd, Ilene Qua, MD, 650 mg at 08/10/20 0349 .  albuterol (VENTOLIN HFA) 108 (90 Base) MCG/ACT inhaler 2 puff, 2 puff, Inhalation, Q6H PRN, Opyd, Timothy S, MD .  allopurinol (ZYLOPRIM) tablet 300 mg, 300 mg, Oral, Daily, Opyd, Ilene Qua, MD, 300 mg at 08/10/20 0955 .  azithromycin (ZITHROMAX) 500 mg in sodium chloride 0.9 % 250 mL IVPB, 500 mg, Intravenous, Q24H, Lorella Nimrod, MD, Stopped at 08/10/20 2317 .  cefTRIAXone  (ROCEPHIN) 2 g in sodium chloride 0.9 % 100 mL IVPB, 2 g, Intravenous, Q24H, Lorella Nimrod, MD, Stopped at 08/11/20 0126 .  Chlorhexidine Gluconate Cloth 2 % PADS 6 each, 6 each, Topical, Daily, Ottie Glazier, MD, 6 each at 08/10/20 1401 .  chlorpheniramine-HYDROcodone (TUSSIONEX) 10-8 MG/5ML suspension 5 mL, 5 mL, Oral, Q12H PRN, Opyd, Timothy S, MD .  dexamethasone (DECADRON) injection 6 mg, 6 mg, Intravenous, Q24H, Ottie Glazier, MD, 6  mg at 08/10/20 2008 .  dexmedetomidine (PRECEDEX) 400 MCG/100ML (4 mcg/mL) infusion, 0.4-1.2 mcg/kg/hr, Intravenous, Titrated, Zina Pitzer, MD, Last Rate: 35.4 mL/hr at 08/11/20 1547, 1.2 mcg/kg/hr at 08/11/20 1547 .  enoxaparin (LOVENOX) injection 60 mg, 0.5 mg/kg, Subcutaneous, Q2200, Opyd, Ilene Qua, MD, 60 mg at 08/10/20 2159 .  fentaNYL 2558mcg in NS 260mL (21mcg/ml) infusion-PREMIX, 0-400 mcg/hr, Intravenous, Continuous, Jais Demir, MD, Last Rate: 20 mL/hr at 08/11/20 1524, 200 mcg/hr at 08/11/20 1524 .  guaiFENesin-dextromethorphan (ROBITUSSIN DM) 100-10 MG/5ML syrup 10 mL, 10 mL, Oral, Q4H PRN, Opyd, Ilene Qua, MD .  HYDROcodone-acetaminophen (NORCO/VICODIN) 5-325 MG per tablet 1-2 tablet, 1-2 tablet, Oral, Q4H PRN, Opyd, Timothy S, MD .  insulin aspart (novoLOG) injection 0-15 Units, 0-15 Units, Subcutaneous, TID WC, Lorella Nimrod, MD, 3 Units at 08/11/20 1147 .  insulin aspart (novoLOG) injection 0-5 Units, 0-5 Units, Subcutaneous, QHS, Lorella Nimrod, MD, 2 Units at 08/10/20 2202 .  LORazepam (ATIVAN) 2 MG/ML injection, , , ,  .  midazolam (VERSED) 50 mg/50 mL (1 mg/mL) premix infusion, 0.5-10 mg/hr, Intravenous, Continuous, Tevin Shillingford, MD, Last Rate: 4 mL/hr at 08/11/20 1543, 4 mg/hr at 08/11/20 1543 .  ondansetron (ZOFRAN) tablet 4 mg, 4 mg, Oral, Q6H PRN **OR** ondansetron (ZOFRAN) injection 4 mg, 4 mg, Intravenous, Q6H PRN, Opyd, Timothy S, MD .  propofol (DIPRIVAN) 1000 MG/100ML infusion, 5-80 mcg/kg/min, Intravenous, Titrated,  Lyndzie Zentz, MD, Last Rate: 7.07 mL/hr at 08/11/20 1501, 10 mcg/kg/min at 08/11/20 1501 .  [COMPLETED] remdesivir 200 mg in sodium chloride 0.9% 250 mL IVPB, 200 mg, Intravenous, Once, Stopped at 08/10/20 0201 **FOLLOWED BY** remdesivir 100 mg in sodium chloride 0.9 % 100 mL IVPB, 100 mg, Intravenous, Daily, Opyd, Ilene Qua, MD, Last Rate: 200 mL/hr at 08/11/20 0910, 100 mg at 08/11/20 0910    ALLERGIES   Amoxicillin    REVIEW OF SYSTEMS     Unable to obtain due to sedation on precedex drip  PHYSICAL EXAMINATION   Vital Signs: Temp:  [96.9 F (36.1 C)-97.1 F (36.2 C)] 97.1 F (36.2 C) (12/23 0800) Pulse Rate:  [61-102] 61 (12/23 0800) Resp:  [16-45] 34 (12/23 0800) BP: (76-140)/(45-93) 133/84 (12/23 0800) SpO2:  [80 %-100 %] 100 % (12/23 1458) FiO2 (%):  [95 %-100 %] 100 % (12/23 1458)  GENERAL:age appropriate sedated HEAD: Normocephalic, atraumatic.  EYES: Pupils equal, round, reactive to light.  No scleral icterus. ETT+ MOUTH: dryt mucosal membrane. NECK: Supple. No thyromegaly. No nodules. No JVD.  PULMONARY: ronchi bilaterally  CARDIOVASCULAR: S1 and S2. Regular rate and rhythm. No murmurs, rubs, or gallops.  GASTROINTESTINAL: Soft, nontender, non-distended. No masses. Positive bowel sounds. No hepatosplenomegaly.  MUSCULOSKELETAL: No swelling, clubbing, or edema.  NEUROLOGIC: GCS4T SKIN:intact,warm,dry   PERTINENT DATA     Infusions: . sodium chloride Stopped (08/10/20 2217)  . azithromycin Stopped (08/10/20 2317)  . cefTRIAXone (ROCEPHIN)  IV Stopped (08/11/20 0126)  . dexmedetomidine (PRECEDEX) IV infusion 1.2 mcg/kg/hr (08/11/20 1547)  . fentaNYL infusion INTRAVENOUS 200 mcg/hr (08/11/20 1524)  . midazolam 4 mg/hr (08/11/20 1543)  . propofol (DIPRIVAN) infusion 10 mcg/kg/min (08/11/20 1501)  . remdesivir 100 mg in NS 100 mL 100 mg (08/11/20 0910)   Scheduled Medications: . allopurinol  300 mg Oral Daily  . Chlorhexidine Gluconate Cloth  6 each  Topical Daily  . dexamethasone (DECADRON) injection  6 mg Intravenous Q24H  . enoxaparin (LOVENOX) injection  0.5 mg/kg Subcutaneous Q2200  . insulin aspart  0-15 Units Subcutaneous TID WC  . insulin aspart  0-5 Units Subcutaneous QHS  . LORazepam       PRN Medications: sodium chloride, acetaminophen, albuterol, chlorpheniramine-HYDROcodone, guaiFENesin-dextromethorphan, HYDROcodone-acetaminophen, ondansetron **OR** ondansetron (ZOFRAN) IV Hemodynamic parameters:   Intake/Output: 12/22 0701 - 12/23 0700 In: 763.7 [I.V.:313.1; IV Piggyback:450.6] Out: 1775 [Urine:1725; Stool:50]  Ventilator  Settings: Vent Mode: PRVC FiO2 (%):  [95 %-100 %] 100 % Set Rate:  [20 bmp] 20 bmp Vt Set:  [500 mL] 500 mL PEEP:  [16 cmH20] 16 cmH20    LAB RESULTS:  Basic Metabolic Panel: Recent Labs  Lab 08/09/20 2111 08/10/20 0433 08/11/20 0636  NA 133* 139 140  K 4.4 5.8* 4.4  CL 95* 100 102  CO2 22 29 26   GLUCOSE 157* 287* 194*  BUN 7 9 15   CREATININE 1.10 0.69 0.50*  CALCIUM 8.4* 8.9 8.9  MG  --  2.1  --   PHOS  --  2.6  --    Liver Function Tests: Recent Labs  Lab 08/09/20 2111 08/10/20 0433 08/11/20 0636  AST 53* 41 28  ALT 32 28 24  ALKPHOS 53 49 46  BILITOT 1.5* 0.5 0.7  PROT 7.6 6.9 6.8  ALBUMIN 3.5 2.8* 2.9*   No results for input(s): LIPASE, AMYLASE in the last 168 hours. No results for input(s): AMMONIA in the last 168 hours. CBC: Recent Labs  Lab 08/09/20 2111 08/10/20 0433 08/11/20 0636  WBC 12.3* 5.8 7.6  NEUTROABS 10.7* 5.1 6.7  HGB 12.8* 12.1* 11.8*  HCT 40.2 36.9* 36.9*  MCV 90.7 92.5 91.6  PLT 160 112* 133*   Cardiac Enzymes: No results for input(s): CKTOTAL, CKMB, CKMBINDEX, TROPONINI in the last 168 hours. BNP: Invalid input(s): POCBNP CBG: Recent Labs  Lab 08/10/20 1251 08/10/20 1551 08/10/20 2201 08/11/20 0747 08/11/20 1144  GLUCAP 210* 200* 204* 187* 189*       IMAGING RESULTS:  Imaging: DG Chest 1 View  Result Date:  08/11/2020 CLINICAL DATA:  Check endotracheal tube placement EXAM: CHEST  1 VIEW COMPARISON:  08/10/2019 FINDINGS: Endotracheal tube is noted just below the thoracic inlet. Gastric catheter extends into the stomach. Cardiac shadow remains enlarged. Central vascular congestion and edema is noted. Bony abnormality is seen. IMPRESSION: Tubes and lines as described. Vascular congestion with edema. Electronically Signed   By: Inez Catalina M.D.   On: 08/11/2020 15:38   DG Abd 1 View  Result Date: 08/11/2020 CLINICAL DATA:  Check gastric catheter placement EXAM: ABDOMEN - 1 VIEW COMPARISON:  None. FINDINGS: Gastric catheter is noted within the stomach. Proximal side port lies at the gastroesophageal junction. This could be advanced deeper into the stomach. No obstructive changes are seen. IMPRESSION: Gastric catheter as described. This could be advanced further into the stomach. Electronically Signed   By: Inez Catalina M.D.   On: 08/11/2020 15:44   DG Chest Port 1 View  Result Date: 08/09/2020 CLINICAL DATA:  29 year old male with concern for sepsis. EXAM: PORTABLE CHEST 1 VIEW COMPARISON:  Chest radiograph dated 07/13/2020. FINDINGS: Bilateral confluent hazy densities most consistent with multifocal pneumonia, possibly viral or atypical in etiology including COVID-19. Clinical correlation is recommended. There is no pleural effusion pneumothorax. Mild cardiomegaly. No acute osseous pathology. IMPRESSION: Multifocal pneumonia. Clinical correlation and follow-up recommended. Electronically Signed   By: Anner Crete M.D.   On: 08/09/2020 21:37   @PROBHOSP @ DG Chest 1 View  Result Date: 08/11/2020 CLINICAL DATA:  Check endotracheal tube placement EXAM: CHEST  1 VIEW COMPARISON:  08/10/2019 FINDINGS: Endotracheal tube is noted just below  the thoracic inlet. Gastric catheter extends into the stomach. Cardiac shadow remains enlarged. Central vascular congestion and edema is noted. Bony abnormality is seen.  IMPRESSION: Tubes and lines as described. Vascular congestion with edema. Electronically Signed   By: Inez Catalina M.D.   On: 08/11/2020 15:38   DG Abd 1 View  Result Date: 08/11/2020 CLINICAL DATA:  Check gastric catheter placement EXAM: ABDOMEN - 1 VIEW COMPARISON:  None. FINDINGS: Gastric catheter is noted within the stomach. Proximal side port lies at the gastroesophageal junction. This could be advanced deeper into the stomach. No obstructive changes are seen. IMPRESSION: Gastric catheter as described. This could be advanced further into the stomach. Electronically Signed   By: Inez Catalina M.D.   On: 08/11/2020 15:44     ASSESSMENT AND PLAN    -Multidisciplinary rounds held today  Acute Hypoxic Respiratory Failure Acute COVID19 pneumonia with severe ARDS -Remdesevir antiviral - pharmacy protocol 5 d -vitamin C -zinc -decadron 6mg  IV daily  -Diuresis - Lasix 40 IV daily - monitor UOP - utilize external urinary catheter if possible- -Self prone if patient can tolerate   -d/c hepatotoxic medications while on remdesevir -baricitinib per covid protocol -supportive care with ICU telemetry monitoring -PT/OT when possible -procalcitonin, CRP and ferritin trending -baricitinib per protocol      Perforated diverticulitis   - s/p colostomy     - may present problem with proning protocol for COVID    - reviewed care plan with surgeon today - Dr Windell Moment - appreciate input ICU telemetry  Monitoring   ID -continue IV abx as prescibed -follow up cultures  GI/Nutrition GI PROPHYLAXIS as indicated DIET-->TF's as tolerated Constipation protocol as indicated  ENDO - ICU hypoglycemic\Hyperglycemia protocol -check FSBS per protocol   ELECTROLYTES -follow labs as needed -replace as needed -pharmacy consultation   DVT/GI PRX ordered -SCDs  TRANSFUSIONS AS NEEDED MONITOR FSBS ASSESS the need for LABS as needed   Critical care provider statement:    Critical care  time (minutes):  34   Critical care time was exclusive of:  Separately billable procedures and treating other patients   Critical care was necessary to treat or prevent imminent or life-threatening deterioration of the following conditions:  severe acute hypoxemic respiratory failure, severe ARDS, perforated diverticulitis.    Critical care was time spent personally by me on the following activities:  Development of treatment plan with patient or surrogate, discussions with consultants, evaluation of patient's response to treatment, examination of patient, obtaining history from patient or surrogate, ordering and performing treatments and interventions, ordering and review of laboratory studies and re-evaluation of patient's condition.  I assumed direction of critical care for this patient from another provider in my specialty: no    This document was prepared using Dragon voice recognition software and may include unintentional dictation errors.    Ottie Glazier, M.D.  Division of [place]

## 2020-08-11 NOTE — Progress Notes (Signed)
Pt spO2 sustaining in low 80s. Marda Stalker, NP aware and at bedside.

## 2020-08-12 LAB — CBC WITH DIFFERENTIAL/PLATELET
Abs Immature Granulocytes: 0.03 10*3/uL (ref 0.00–0.07)
Basophils Absolute: 0 10*3/uL (ref 0.0–0.1)
Basophils Relative: 0 %
Eosinophils Absolute: 0 10*3/uL (ref 0.0–0.5)
Eosinophils Relative: 0 %
HCT: 34.3 % — ABNORMAL LOW (ref 39.0–52.0)
Hemoglobin: 10.4 g/dL — ABNORMAL LOW (ref 13.0–17.0)
Immature Granulocytes: 1 %
Lymphocytes Relative: 7 %
Lymphs Abs: 0.4 10*3/uL — ABNORMAL LOW (ref 0.7–4.0)
MCH: 29.1 pg (ref 26.0–34.0)
MCHC: 30.3 g/dL (ref 30.0–36.0)
MCV: 96.1 fL (ref 80.0–100.0)
Monocytes Absolute: 0.3 10*3/uL (ref 0.1–1.0)
Monocytes Relative: 5 %
Neutro Abs: 5.4 10*3/uL (ref 1.7–7.7)
Neutrophils Relative %: 87 %
Platelets: 151 10*3/uL (ref 150–400)
RBC: 3.57 MIL/uL — ABNORMAL LOW (ref 4.22–5.81)
RDW: 15.5 % (ref 11.5–15.5)
WBC: 6.2 10*3/uL (ref 4.0–10.5)
nRBC: 0 % (ref 0.0–0.2)

## 2020-08-12 LAB — BLOOD GAS, ARTERIAL
Acid-Base Excess: 2.7 mmol/L — ABNORMAL HIGH (ref 0.0–2.0)
Bicarbonate: 28.4 mmol/L — ABNORMAL HIGH (ref 20.0–28.0)
FIO2: 0.7
MECHVT: 500 mL
O2 Saturation: 99.7 %
PEEP: 16 cmH2O
Patient temperature: 37
RATE: 20 resp/min
pCO2 arterial: 48 mmHg (ref 32.0–48.0)
pH, Arterial: 7.38 (ref 7.350–7.450)
pO2, Arterial: 197 mmHg — ABNORMAL HIGH (ref 83.0–108.0)

## 2020-08-12 LAB — GLUCOSE, CAPILLARY
Glucose-Capillary: 164 mg/dL — ABNORMAL HIGH (ref 70–99)
Glucose-Capillary: 148 mg/dL — ABNORMAL HIGH (ref 70–99)
Glucose-Capillary: 156 mg/dL — ABNORMAL HIGH (ref 70–99)
Glucose-Capillary: 164 mg/dL — ABNORMAL HIGH (ref 70–99)
Glucose-Capillary: 147 mg/dL — ABNORMAL HIGH (ref 70–99)
Glucose-Capillary: 153 mg/dL — ABNORMAL HIGH (ref 70–99)

## 2020-08-12 LAB — COMPREHENSIVE METABOLIC PANEL
ALT: 21 U/L (ref 0–44)
AST: 23 U/L (ref 15–41)
Albumin: 2.8 g/dL — ABNORMAL LOW (ref 3.5–5.0)
Alkaline Phosphatase: 40 U/L (ref 38–126)
Anion gap: 9 (ref 5–15)
BUN: 25 mg/dL — ABNORMAL HIGH (ref 6–20)
CO2: 29 mmol/L (ref 22–32)
Calcium: 8.6 mg/dL — ABNORMAL LOW (ref 8.9–10.3)
Chloride: 107 mmol/L (ref 98–111)
Creatinine, Ser: 0.75 mg/dL (ref 0.61–1.24)
GFR, Estimated: >60 mL/min (ref >60)
Glucose, Bld: 179 mg/dL — ABNORMAL HIGH (ref 70–99)
Potassium: 5.3 mmol/L — ABNORMAL HIGH (ref 3.5–5.1)
Sodium: 145 mmol/L (ref 135–145)
Total Bilirubin: 0.5 mg/dL (ref 0.3–1.2)
Total Protein: 6.6 g/dL (ref 6.5–8.1)

## 2020-08-12 LAB — C-REACTIVE PROTEIN: CRP: 11.1 mg/dL — ABNORMAL HIGH (ref <1.0)

## 2020-08-12 LAB — FERRITIN: Ferritin: 1563 ng/mL — ABNORMAL HIGH (ref 24–336)

## 2020-08-12 LAB — FIBRIN DERIVATIVES D-DIMER (ARMC ONLY): Fibrin derivatives D-dimer (ARMC): 4272.98 ng/mL (FEU) — ABNORMAL HIGH (ref 0.00–499.00)

## 2020-08-12 MED ORDER — GUAIFENESIN-DM 100-10 MG/5ML PO SYRP: 10.0000 mL | ORAL_SOLUTION | ORAL | Status: DC | PRN

## 2020-08-12 MED ORDER — PANTOPRAZOLE SODIUM 40 MG PO PACK
40.0000 mg | PACK | Freq: Every day | ORAL | Status: DC
  Administered 2020-08-12 – 2020-08-19 (×7): 40 mg
  Filled 2020-08-12 (×8): qty 20

## 2020-08-12 MED ORDER — ALLOPURINOL 300 MG PO TABS
300.0000 mg | ORAL_TABLET | Freq: Every day | ORAL | Status: DC
  Administered 2020-08-14 – 2020-09-24 (×38): 300 mg
  Filled 2020-08-12 (×38): qty 1

## 2020-08-12 MED ORDER — THIAMINE HCL 100 MG PO TABS
100.0000 mg | ORAL_TABLET | Freq: Every day | ORAL | Status: AC
  Administered 2020-08-14 – 2020-08-16 (×3): 100 mg
  Filled 2020-08-12 (×3): qty 1

## 2020-08-12 MED ORDER — ADULT MULTIVITAMIN LIQUID CH
15.0000 mL | Freq: Every day | ORAL | Status: DC
  Administered 2020-08-14 – 2020-08-15 (×2): 15 mL
  Filled 2020-08-12 (×3): qty 15

## 2020-08-12 MED ORDER — HYDROCOD POLST-CPM POLST ER 10-8 MG/5ML PO SUER: 5.0000 mL | Freq: Two times a day (BID) | ORAL | Status: DC | PRN

## 2020-08-12 MED ORDER — VITAL HIGH PROTEIN PO LIQD
1000.0000 mL | ORAL | Status: DC
  Administered 2020-08-12 – 2020-08-14 (×2): 1000 mL

## 2020-08-12 MED ORDER — FUROSEMIDE 10 MG/ML IJ SOLN
20.0000 mg | Freq: Once | INTRAMUSCULAR | Status: AC
  Administered 2020-08-12: 20 mg via INTRAVENOUS
  Filled 2020-08-12: qty 2

## 2020-08-12 MED ORDER — CHLORHEXIDINE GLUCONATE 0.12 % MT SOLN
OROMUCOSAL | Status: AC
  Administered 2020-08-12: 15 mL via OROMUCOSAL
  Filled 2020-08-12: qty 15

## 2020-08-12 MED ORDER — ACETAMINOPHEN 325 MG PO TABS
650.0000 mg | ORAL_TABLET | Freq: Four times a day (QID) | ORAL | Status: DC | PRN
  Administered 2020-08-14: 650 mg
  Filled 2020-08-12: qty 2

## 2020-08-12 MED ORDER — HYDROCODONE-ACETAMINOPHEN 5-325 MG PO TABS: 1.0000 | ORAL_TABLET | ORAL | Status: DC | PRN

## 2020-08-12 MED ORDER — PROSOURCE TF PO LIQD
90.0000 mL | Freq: Every day | ORAL | Status: DC
  Administered 2020-08-12 – 2020-08-15 (×12): 90 mL
  Filled 2020-08-12 (×21): qty 90

## 2020-08-12 NOTE — Progress Notes (Signed)
CRITICAL CARE PROGRESS NOTE    Name: Craig [name] MRN: [id number] DOB: 03-Feb-1991  Referring physician: Dr Reesa Chew    LOS: 3  R  SUBJECTIVE FINDINGS & SIGNIFICANT EVENTS    Patient description:  29 yo male recent hospitalization for perforated diverticulitis s/p colostomy with reversal and leakage of anastomosis with s/p revision now acutely hypoxemic with COVID19 induced severe ARDS.    08/11/20-patient continued to decline with worsening respiratory status despite 100%Fio2 on BIPAP and HFNC failure.  He stated that he feels he is dying and cannot breathe.  I discussed with wife [name] earlier today that patient is critically ill may need ETT, she is agreeable and thankful for care. I specifically discussed and explained intubation and mechanical ventilation to patient and he wishes to proceed.    08/12/20- patient was weaned on FiO2 to 60%. Spoke to wife [name].   Lines/tubes : Closed System Drain 1 Right Abdomen Bulb (JP) 19 Fr. (Active)     Colostomy LLQ (Active)  Stoma Assessment Pink;Red 08/10/20 0944  Peristomal Assessment Intact 08/10/20 0944    Microbiology/Sepsis markers: Results for orders placed or performed during the hospital encounter of 08/09/20  Culture, blood (Routine X 2) w Reflex to ID Panel     Status: None (Preliminary result)   Collection Time: 08/09/20  9:06 PM   Specimen: BLOOD  Result Value Ref Range Status   Specimen Description BLOOD RIGHT ARM  Final   Special Requests   Final    BOTTLES DRAWN AEROBIC AND ANAEROBIC Blood Culture adequate volume   Culture   Final    NO GROWTH 2 DAYS Performed at [facility], [address] [place], [place] [address]    Report Status PENDING  Incomplete  Culture, blood (Routine X 2) w Reflex to ID Panel     Status: None  (Preliminary result)   Collection Time: 08/09/20  9:06 PM   Specimen: BLOOD  Result Value Ref Range Status   Specimen Description BLOOD RIGHT [facility]  Final   Special Requests   Final    BOTTLES DRAWN AEROBIC AND ANAEROBIC Blood Culture adequate volume   Culture   Final    NO GROWTH 2 DAYS Performed at [facility], [address] [place], [place] [address]    Report Status PENDING  Incomplete  Resp Panel by RT-PCR (Flu A&B, Covid) Nasopharyngeal Swab     Status: Abnormal   Collection Time: 08/09/20  9:11 PM   Specimen: Nasopharyngeal Swab; Nasopharyngeal(NP) swabs in vial transport medium  Result Value Ref Range Status   SARS Coronavirus 2 by RT PCR POSITIVE (A) NEGATIVE Final    Comment: RESULT CALLED TO, READ BACK BY AND VERIFIED WITH: JANE MARTIN _0  ON 08/09/20 SKL (NOTE) SARS-CoV-2 target nucleic acids are DETECTED.  The SARS-CoV-2 RNA is generally detectable in upper respiratory specimens during the acute phase of infection. Positive results are indicative of the presence of the identified virus, but do not rule out bacterial infection or co-infection with other pathogens not detected by the test. Clinical correlation with patient history and other diagnostic information is necessary to determine patient infection status. The expected result is Negative.  Fact Sheet for Patients: EntrepreneurPulse.com.au  Fact Sheet for Healthcare Providers: IncredibleEmployment.be  This test is not yet approved or cleared by the [place] FDA and  has been authorized for detection and/or diagnosis of SARS-CoV-2 by FDA under an Emergency Use Authorization (EUA).  This EUA will remain in effect (meaning this test  can be  used) for the duration of  the COVID-19 declaration under Section 564(b)(1) of the Act, 21 U.S.C. section 360bbb-3(b)(1), unless the authorization is terminated or revoked sooner.     Influenza A by PCR NEGATIVE  NEGATIVE Final   Influenza B by PCR NEGATIVE NEGATIVE Final    Comment: (NOTE) The Xpert Xpress SARS-CoV-2/FLU/RSV plus assay is intended as an aid in the diagnosis of influenza from Nasopharyngeal swab specimens and should not be used as a sole basis for treatment. Nasal washings and aspirates are unacceptable for Xpert Xpress SARS-CoV-2/FLU/RSV testing.  Fact Sheet for Patients: EntrepreneurPulse.com.au  Fact Sheet for Healthcare Providers: IncredibleEmployment.be  This test is not yet approved or cleared by the [place] FDA and has been authorized for detection and/or diagnosis of SARS-CoV-2 by FDA under an Emergency Use Authorization (EUA). This EUA will remain in effect (meaning this test can be used) for the duration of the COVID-19 declaration under Section 564(b)(1) of the Act, 21 U.S.C. section 360bbb-3(b)(1), unless the authorization is terminated or revoked.  Performed at [facility], [address], [place], [place] [address]   Urine culture     Status: None   Collection Time: 08/10/20  4:33 AM   Specimen: In/Out Cath Urine  Result Value Ref Range Status   Specimen Description   Final    IN/OUT CATH URINE Performed at [facility], [address] [place], [place] [address]    Special Requests   Final    NONE Performed at [facility], [address], [place], [place] [address]    Culture   Final    NO GROWTH Performed at [facility], [place] [address], [place], [place] [address]    Report Status 08/11/2020 FINAL  Final  MRSA PCR Screening     Status: None   Collection Time: 08/10/20 12:40 PM   Specimen: Nasopharyngeal  Result Value Ref Range Status   MRSA by PCR NEGATIVE NEGATIVE Final    Comment:        The GeneXpert MRSA Assay (FDA approved for NASAL specimens only), is one component of a comprehensive MRSA colonization surveillance program. It is not intended to diagnose  MRSA infection nor to guide or monitor treatment for MRSA infections. Performed at [facility], [address], [place],  [address]     Anti-infectives:  Anti-infectives (From admission, onward)   Start     Dose/Rate Route Frequency Ordered Stop   08/11/20 0100  cefTRIAXone (ROCEPHIN) 2 g in sodium chloride 0.9 % 100 mL IVPB        2 g 200 mL/hr over 30 Minutes Intravenous Every 24 hours 08/10/20 0859     08/10/20 1000  remdesivir 100 mg in sodium chloride 0.9 % 100 mL IVPB       "Followed by" Linked Group Details   100 mg 200 mL/hr over 30 Minutes Intravenous Daily 08/09/20 2236 08/14/20 0959   08/10/20 0000  cefTRIAXone (ROCEPHIN) 2 g in sodium chloride 0.9 % 100 mL IVPB        2 g 200 mL/hr over 30 Minutes Intravenous  Once 08/09/20 2352 08/10/20 0125   08/09/20 2345  levofloxacin (LEVAQUIN) IVPB 750 mg  Status:  Discontinued        750 mg 100 mL/hr over 90 Minutes Intravenous  Once 08/09/20 2339 08/09/20 2351   08/09/20 2300  remdesivir 200 mg in sodium chloride 0.9% 250 mL IVPB       "Followed by"  Linked Group Details   200 mg 580 mL/hr over 30 Minutes Intravenous Once 08/09/20 2236 08/10/20 0201   08/09/20 2300  azithromycin (ZITHROMAX) 500 mg in sodium chloride 0.9 % 250 mL IVPB        500 mg 250 mL/hr over 60 Minutes Intravenous Every 24 hours 08/10/20 0859 08/12/20 2259   08/09/20 2230  azithromycin (ZITHROMAX) 500 mg in sodium chloride 0.9 % 250 mL IVPB        500 mg 250 mL/hr over 60 Minutes Intravenous  Once 08/09/20 2227 08/10/20 0038        PAST MEDICAL HISTORY   Past Medical History:  Diagnosis Date  . Gout   . Hypertension   . Rupture of bowel [facility])      SURGICAL HISTORY   Past Surgical History:  Procedure Laterality Date  . COLONOSCOPY WITH PROPOFOL N/A 05/18/2020   Procedure: COLONOSCOPY WITH PROPOFOL;  Surgeon: Benjamine Sprague, DO;  Location: [facility] ENDOSCOPY;  Service: General;  Laterality: N/A;  . COLOSTOMY N/A 11/22/2019    Procedure: COLOSTOMY;  Surgeon: Herbert Pun, MD;  Location: [facility] ORS;  Service: General;  Laterality: N/A;  . LAPAROTOMY N/A 11/22/2019   Procedure: EXPLORATORY LAPAROTOMY;  Surgeon: Herbert Pun, MD;  Location: [facility] ORS;  Service: General;  Laterality: N/A;  . LAPAROTOMY N/A 07/08/2020   Procedure: EXPLORATORY LAPAROTOMY;  Surgeon: Herbert Pun, MD;  Location: [facility] ORS;  Service: General;  Laterality: N/A;  . PARTIAL COLECTOMY N/A 11/22/2019   Procedure: PARTIAL COLECTOMY;  Surgeon: Herbert Pun, MD;  Location: [facility] ORS;  Service: General;  Laterality: N/A;  . XI ROBOTIC ASSISTED COLOSTOMY TAKEDOWN N/A 06/28/2020   Procedure: XI ROBOTIC ASSISTED COLOSTOMY TAKEDOWN CONVERTED TO OPEN PROCEDURE;  Surgeon: Herbert Pun, MD;  Location: [facility] ORS;  Service: General;  Laterality: N/A;     FAMILY HISTORY   Family History  Problem Relation Age of Onset  . Healthy Mother   . Healthy Father      SOCIAL HISTORY   Social History   Tobacco Use  . Smoking status: Never Smoker  . Smokeless tobacco: Current User    Types: Chew  Vaping Use  . Vaping Use: Never used  Substance Use Topics  . Alcohol use: Yes    Comment: pint to a fifth per day per patient . Patient reports he hasn't had anything to drink in three weeks.  . Drug use: Never     MEDICATIONS   Current Medication:  Current Facility-Administered Medications:  .  0.9 %  sodium chloride infusion, 250 mL, Intravenous, PRN, Opyd, Ilene Qua, MD, Stopped at 08/10/20 2217 .  acetaminophen (TYLENOL) tablet 650 mg, 650 mg, Per Tube, Q6H PRN, Lorella Nimrod, MD .  albuterol (VENTOLIN HFA) 108 (90 Base) MCG/ACT inhaler 2 puff, 2 puff, Inhalation, Q6H PRN, Opyd, Ilene Qua, MD .  Derrill Memo ON 08/13/2020] allopurinol (ZYLOPRIM) tablet 300 mg, 300 mg, Per Tube, Daily, Lorella Nimrod, MD .  azithromycin (ZITHROMAX) 500 mg in sodium chloride 0.9 % 250 mL IVPB, 500 mg, Intravenous, Q24H, Lorella Nimrod, MD,  Stopped at 08/11/20 2300 .  cefTRIAXone (ROCEPHIN) 2 g in sodium chloride 0.9 % 100 mL IVPB, 2 g, Intravenous, Q24H, Lorella Nimrod, MD, Stopped at 08/12/20 0045 .  chlorhexidine gluconate (MEDLINE KIT) (PERIDEX) 0.12 % solution 15 mL, 15 mL, Mouth Rinse, BID, Lanney Gins, , MD, 15 mL at 08/12/20 0959 .  Chlorhexidine Gluconate Cloth 2 % PADS 6 each, 6 each, Topical, Daily, Ottie Glazier, MD, 6 each at 08/10/20 1401 .  chlorpheniramine-HYDROcodone (TUSSIONEX) 10-8 MG/5ML suspension 5 mL, 5 mL, Per Tube, Q12H PRN, Lorella Nimrod, MD .  dexamethasone (DECADRON) injection 6 mg, 6 mg, Intravenous, Q24H, Lanney Gins, , MD, 6 mg at 08/11/20 2029 .  dexmedetomidine (PRECEDEX) 400 MCG/100ML (4 mcg/mL) infusion, 0.4-1.2 mcg/kg/hr, Intravenous, Titrated, Ottie Glazier, MD, Stopped at 08/11/20 1843 .  enoxaparin (LOVENOX) injection 60 mg, 0.5 mg/kg, Subcutaneous, Q2200, Opyd, Ilene Qua, MD, 60 mg at 08/11/20 2154 .  fentaNYL 2558mg in NS 2572m(1065mml) infusion-PREMIX, 0-400 mcg/hr, Intravenous, Continuous, , , MD, Last Rate: 17.5 mL/hr at 08/12/20 0709, 175 mcg/hr at 08/12/20 0709 .  guaiFENesin-dextromethorphan (ROBITUSSIN DM) 100-10 MG/5ML syrup 10 mL, 10 mL, Per Tube, Q4H PRN, AmiLorella NimrodD .  HYDROcodone-acetaminophen (NORCO/VICODIN) 5-325 MG per tablet 1-2 tablet, 1-2 tablet, Per Tube, Q4H PRN, AmiLorella NimrodD .  insulin aspart (novoLOG) injection 0-20 Units, 0-20 Units, Subcutaneous, Q4H, KeeDarel Hong NP, 3 Units at 08/12/20 1000 .  MEDLINE mouth rinse, 15 mL, Mouth Rinse, 10 times per day, AleOttie GlazierD, 15 mL at 08/12/20 1003 .  midazolam (VERSED) 50 mg/50 mL (1 mg/mL) premix infusion, 0.5-10 mg/hr, Intravenous, Continuous, AleOttie GlazierD, Stopped at 08/12/20 0015 .  ondansetron (ZOFRAN) tablet 4 mg, 4 mg, Oral, Q6H PRN **OR** ondansetron (ZOFRAN) injection 4 mg, 4 mg, Intravenous, Q6H PRN, Opyd, Timothy S, MD .  propofol (DIPRIVAN) 1000 MG/100ML infusion,  5-80 mcg/kg/min, Intravenous, Titrated, , , MD, Last Rate: 35.4 mL/hr at 08/12/20 1107, 50 mcg/kg/min at 08/12/20 1107 .  [COMPLETED] remdesivir 200 mg in sodium chloride 0.9% 250 mL IVPB, 200 mg, Intravenous, Once, Stopped at 08/10/20 0201 **FOLLOWED BY** remdesivir 100 mg in sodium chloride 0.9 % 100 mL IVPB, 100 mg, Intravenous, Daily, Opyd, TimIlene QuaD, Last Rate: 200 mL/hr at 08/12/20 0958, 100 mg at 08/12/20 0958 .  sodium chloride flush (NS) 0.9 % injection 10-40 mL, 10-40 mL, Intracatheter, Q12H, AmiLorella NimrodD, 10 mL at 08/12/20 1003 .  sodium chloride flush (NS) 0.9 % injection 10-40 mL, 10-40 mL, Intracatheter, PRN, AmiLorella NimrodD    ALLERGIES   Amoxicillin    REVIEW OF SYSTEMS     Unable to obtain due to sedation on precedex drip  PHYSICAL EXAMINATION   Vital Signs: Temp:  [98.6 F (37 C)-100.4 F (38 C)] 98.6 F (37 C) (12/24 1000) Pulse Rate:  [72-85] 72 (12/24 1000) Resp:  [15-28] 18 (12/24 1000) BP: (90-123)/(50-78) 106/58 (12/24 1000) SpO2:  [90 %-100 %] 98 % (12/24 1000) FiO2 (%):  [70 %-100 %] 70 % (12/24 0738)  GENERAL:age appropriate sedated HEAD: Normocephalic, atraumatic.  EYES: Pupils equal, round, reactive to light.  No scleral icterus. ETT+ MOUTH: dryt mucosal membrane. NECK: Supple. No thyromegaly. No nodules. No JVD.  PULMONARY: ronchi bilaterally  CARDIOVASCULAR: S1 and S2. Regular rate and rhythm. No murmurs, rubs, or gallops.  GASTROINTESTINAL: Soft, nontender, non-distended. No masses. Positive bowel sounds. No hepatosplenomegaly.  MUSCULOSKELETAL: No swelling, clubbing, or edema.  NEUROLOGIC: GCS4T SKIN:intact,warm,dry   PERTINENT DATA     Infusions: . sodium chloride Stopped (08/10/20 2217)  . azithromycin Stopped (08/11/20 2300)  . cefTRIAXone (ROCEPHIN)  IV Stopped (08/12/20 0045)  . dexmedetomidine (PRECEDEX) IV infusion Stopped (08/11/20 1843)  . fentaNYL infusion INTRAVENOUS 175 mcg/hr (08/12/20 0709)   . midazolam Stopped (08/12/20 0015)  . propofol (DIPRIVAN) infusion 50 mcg/kg/min (08/12/20 1107)  . remdesivir 100 mg in NS 100 mL 100 mg (08/12/20 0958)   Scheduled Medications: . [START ON 08/13/2020] allopurinol  300  mg Per Tube Daily  . chlorhexidine gluconate (MEDLINE KIT)  15 mL Mouth Rinse BID  . Chlorhexidine Gluconate Cloth  6 each Topical Daily  . dexamethasone (DECADRON) injection  6 mg Intravenous Q24H  . enoxaparin (LOVENOX) injection  0.5 mg/kg Subcutaneous Q2200  . insulin aspart  0-20 Units Subcutaneous Q4H  . mouth rinse  15 mL Mouth Rinse 10 times per day  . sodium chloride flush  10-40 mL Intracatheter Q12H   PRN Medications: sodium chloride, acetaminophen, albuterol, chlorpheniramine-HYDROcodone, guaiFENesin-dextromethorphan, HYDROcodone-acetaminophen, ondansetron **OR** ondansetron (ZOFRAN) IV, sodium chloride flush Hemodynamic parameters:   Intake/Output: 12/23 0701 - 12/24 0700 In: 1935.5 [I.V.:1485.5; IV Piggyback:450] Out: 2250 [Urine:2250]  Ventilator  Settings: Vent Mode: PRVC FiO2 (%):  [70 %-100 %] 70 % Set Rate:  [20 bmp] 20 bmp Vt Set:  [500 mL] 500 mL PEEP:  [16 cmH20] 16 cmH20 Plateau Pressure:  [22 cmH20] 22 cmH20    LAB RESULTS:  Basic Metabolic Panel: Recent Labs  Lab 08/09/20 2111 08/10/20 0433 08/11/20 0636 08/12/20 0409  NA 133* 139 140 145  K 4.4 5.8* 4.4 5.3*  CL 95* 100 102 107  CO2 _0 GLUCOSE 157* 287* 194* 179*  BUN _1 25*  CREATININE 1.10 0.69 0.50* 0.75  CALCIUM 8.4* 8.9 8.9 8.6*  MG  --  2.1  --   --   PHOS  --  2.6  --   --    Liver Function Tests: Recent Labs  Lab 08/09/20 2111 08/10/20 0433 08/11/20 0636 08/12/20 0409  AST 53* 41 28 23  ALT 32 _2 ALKPHOS 53 49 46 40  BILITOT 1.5* 0.5 0.7 0.5  PROT 7.6 6.9 6.8 6.6  ALBUMIN 3.5 2.8* 2.9* 2.8*   No results for input(s): LIPASE, AMYLASE in the last 168 hours. No results for input(s): AMMONIA in the last 168 hours. CBC: Recent  Labs  Lab 08/09/20 2111 08/10/20 0433 08/11/20 0636 08/12/20 0409  WBC 12.3* 5.8 7.6 6.2  NEUTROABS 10.7* 5.1 6.7 5.4  HGB 12.8* 12.1* 11.8* 10.4*  HCT 40.2 36.9* 36.9* 34.3*  MCV 90.7 92.5 91.6 96.1  PLT 160 112* 133* 151   Cardiac Enzymes: No results for input(s): CKTOTAL, CKMB, CKMBINDEX, TROPONINI in the last 168 hours. BNP: Invalid input(s): POCBNP CBG: Recent Labs  Lab 08/11/20 1622 08/11/20 1946 08/11/20 2309 08/12/20 0344 08/12/20 0746  GLUCAP 205* 203* 170* 164* 148*       IMAGING RESULTS:  Imaging: DG Chest 1 View  Result Date: 08/11/2020 CLINICAL DATA:  Check endotracheal tube placement EXAM: CHEST  1 VIEW COMPARISON:  08/10/2019 FINDINGS: Endotracheal tube is noted just below the thoracic inlet. Gastric catheter extends into the stomach. Cardiac shadow remains enlarged. Central vascular congestion and edema is noted. Bony abnormality is seen. IMPRESSION: Tubes and lines as described. Vascular congestion with edema. Electronically Signed   By: Inez Catalina M.D.   On: 08/11/2020 15:38   DG Abd 1 View  Result Date: 08/11/2020 CLINICAL DATA:  Check gastric catheter placement EXAM: ABDOMEN - 1 VIEW COMPARISON:  None. FINDINGS: Gastric catheter is noted within the stomach. Proximal side port lies at the gastroesophageal junction. This could be advanced deeper into the stomach. No obstructive changes are seen. IMPRESSION: Gastric catheter as described. This could be advanced further into the stomach. Electronically Signed   By: Inez Catalina M.D.   On: 08/11/2020 15:44   _3 @ DG Chest 1 View  Result Date: 08/11/2020 CLINICAL  DATA:  Check endotracheal tube placement EXAM: CHEST  1 VIEW COMPARISON:  08/10/2019 FINDINGS: Endotracheal tube is noted just below the thoracic inlet. Gastric catheter extends into the stomach. Cardiac shadow remains enlarged. Central vascular congestion and edema is noted. Bony abnormality is seen. IMPRESSION: Tubes and lines as  described. Vascular congestion with edema. Electronically Signed   By: Inez Catalina M.D.   On: 08/11/2020 15:38   DG Abd 1 View  Result Date: 08/11/2020 CLINICAL DATA:  Check gastric catheter placement EXAM: ABDOMEN - 1 VIEW COMPARISON:  None. FINDINGS: Gastric catheter is noted within the stomach. Proximal side port lies at the gastroesophageal junction. This could be advanced deeper into the stomach. No obstructive changes are seen. IMPRESSION: Gastric catheter as described. This could be advanced further into the stomach. Electronically Signed   By: Inez Catalina M.D.   On: 08/11/2020 15:44     ASSESSMENT AND PLAN    -Multidisciplinary rounds held today  Acute Hypoxic Respiratory Failure Acute COVID19 pneumonia with severe ARDS -Remdesevir antiviral - pharmacy protocol 5 d -vitamin C -zinc -decadron 97m IV daily  -Diuresis - Lasix 40 IV daily - monitor UOP - utilize external urinary catheter if possible- -Self prone if patient can tolerate   -d/c hepatotoxic medications while on remdesevir -baricitinib per covid protocol -supportive care with ICU telemetry monitoring -PT/OT when possible -procalcitonin, CRP and ferritin trending -baricitinib per protocol      Perforated diverticulitis   - s/p colostomy     - may present problem with proning protocol for COVID    - reviewed care plan with surgeon today - Dr CWindell Moment- appreciate input ICU telemetry  Monitoring   ID -continue IV abx as prescibed -follow up cultures  GI/Nutrition GI PROPHYLAXIS as indicated DIET-->TF's as tolerated Constipation protocol as indicated  ENDO - ICU hypoglycemic\Hyperglycemia protocol -check FSBS per protocol   ELECTROLYTES -follow labs as needed -replace as needed -pharmacy consultation   DVT/GI PRX ordered -SCDs  TRANSFUSIONS AS NEEDED MONITOR FSBS ASSESS the need for LABS as needed   Critical care provider statement:    Critical care time (minutes):  34   Critical  care time was exclusive of:  Separately billable procedures and treating other patients   Critical care was necessary to treat or prevent imminent or life-threatening deterioration of the following conditions:  severe acute hypoxemic respiratory failure, severe ARDS, perforated diverticulitis.    Critical care was time spent personally by me on the following activities:  Development of treatment plan with patient or surrogate, discussions with consultants, evaluation of patient's response to treatment, examination of patient, obtaining history from patient or surrogate, ordering and performing treatments and interventions, ordering and review of laboratory studies and re-evaluation of patient's condition.  I assumed direction of critical care for this patient from another provider in my specialty: no    This document was prepared using Dragon voice recognition software and may include unintentional dictation errors.    FOttie Glazier M.D.  [place]

## 2020-08-12 NOTE — Progress Notes (Signed)
Spoke with mother and wife via telephone, provided updates and answered all questions.

## 2020-08-12 NOTE — Progress Notes (Addendum)
Initial Nutrition Assessment  DOCUMENTATION CODES:   Obesity unspecified  INTERVENTION:   Once OGT advanced and in correct position, recommend:  Initiate Vital HP @20ml /hr + Pro-Source 74ml- 6 times daily via tube  Propofol: 35.4 ml/hr- provides 934kcal/day   Free water flushes 16ml q4 hours to maintain tube patency   Regimen provides 1894kcal/day, 174g/day protein and 548ml/day free water  Liquid MVI daily via tube  Thiamine 100mg  per tube x 3 days  Pt at high refeed risk; recommend monitor potassium, magnesium and phosphorus labs daily until stable  NUTRITION DIAGNOSIS:   Inadequate oral intake related to inability to eat (pt sedated and ventilated) as evidenced by NPO status.  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 Pt developed worsening respiratory failure 12/23 requiring intubation   Pt sedated and ventilated. OGT in place with side port at the GE junction; spoke with RN about advancing tube prior to starting tube feeds. Plan is to start feeds at trickle rate today. Suspect pt with poor appetite and oral intake pta r/t COVID 19. Per chart, pt appears weight stable at baseline.   Medications reviewed and include: allopurinol, dexamethasone, lovenox, lasix, insulin, azithromycin, ceftriaxone, fentanyl, propofol  Labs reviewed: 5.3(H), BUN 25(H) Hgb 10.4(L), Hct 34.3(L) cbgs- 164, 148, 156 x 24 hrs  Patient is currently intubated on ventilator support MV: 9.8 L/min Temp (24hrs), Avg:99.6 F (37.6 C), Min:98.6 F (37 C), Max:100.4 F (38 C)  Propofol: 35.4 ml/hr-  provides 934kcal/day   MAP- 62-24mmHg  UOP- 2227ml  NUTRITION - FOCUSED PHYSICAL EXAM:  Flowsheet Row Most Recent Value  Orbital Region No depletion  Upper Arm Region No depletion  Thoracic and Lumbar Region No depletion  Buccal Region No depletion  Temple Region No depletion  Clavicle Bone Region No depletion  Clavicle and Acromion Bone Region No depletion  Scapular Bone Region No depletion  Dorsal Hand No depletion  Patellar Region No depletion  Anterior Thigh Region No depletion  Posterior Calf Region No depletion  Edema (RD Assessment) None  Hair Reviewed  Eyes Reviewed  Mouth Reviewed  Skin Reviewed  Nails Reviewed     Diet Order:   Diet Order            Diet NPO time specified  Diet effective now                EDUCATION NEEDS:   Not appropriate for education at this time  Skin:  Skin Assessment: Reviewed RN Assessment  Last BM:  12/24- 2 pieces via ostomy  Height:   Ht Readings from Last 1 Encounters:  08/09/20 6' (1.829 m)    Weight:   Wt Readings from Last 1 Encounters:  08/09/20 117.9 kg    Ideal Body Weight:  80.9 kg  BMI:  Body mass index is 35.25 kg/m.  Estimated Nutritional Needs:   Kcal:  1914kcal/day  Protein:  161g/day  Fluid:  2.4-2.7L/day  Koleen Distance MS, RD, LDN Please refer to [facility] for RD and/or RD on-call/weekend/after hours pager

## 2020-08-13 ENCOUNTER — Inpatient Hospital Stay: Payer: No Typology Code available for payment source

## 2020-08-13 LAB — CBC WITH DIFFERENTIAL/PLATELET
Abs Immature Granulocytes: 0.04 10*3/uL (ref 0.00–0.07)
Basophils Absolute: 0 10*3/uL (ref 0.0–0.1)
Basophils Relative: 0 %
Eosinophils Absolute: 0 10*3/uL (ref 0.0–0.5)
Eosinophils Relative: 0 %
HCT: 32.8 % — ABNORMAL LOW (ref 39.0–52.0)
Hemoglobin: 9.8 g/dL — ABNORMAL LOW (ref 13.0–17.0)
Immature Granulocytes: 1 %
Lymphocytes Relative: 8 %
Lymphs Abs: 0.5 10*3/uL — ABNORMAL LOW (ref 0.7–4.0)
MCH: 29.1 pg (ref 26.0–34.0)
MCHC: 29.9 g/dL — ABNORMAL LOW (ref 30.0–36.0)
MCV: 97.3 fL (ref 80.0–100.0)
Monocytes Absolute: 0.2 10*3/uL (ref 0.1–1.0)
Monocytes Relative: 4 %
Neutro Abs: 5 10*3/uL (ref 1.7–7.7)
Neutrophils Relative %: 87 %
Platelets: 145 10*3/uL — ABNORMAL LOW (ref 150–400)
RBC: 3.37 MIL/uL — ABNORMAL LOW (ref 4.22–5.81)
RDW: 15.7 % — ABNORMAL HIGH (ref 11.5–15.5)
WBC: 5.8 10*3/uL (ref 4.0–10.5)
nRBC: 0 % (ref 0.0–0.2)

## 2020-08-13 LAB — COMPREHENSIVE METABOLIC PANEL
ALT: 19 U/L (ref 0–44)
AST: 19 U/L (ref 15–41)
Albumin: 2.7 g/dL — ABNORMAL LOW (ref 3.5–5.0)
Alkaline Phosphatase: 37 U/L — ABNORMAL LOW (ref 38–126)
Anion gap: 10 (ref 5–15)
BUN: 37 mg/dL — ABNORMAL HIGH (ref 6–20)
CO2: 27 mmol/L (ref 22–32)
Calcium: 8.6 mg/dL — ABNORMAL LOW (ref 8.9–10.3)
Chloride: 108 mmol/L (ref 98–111)
Creatinine, Ser: 0.6 mg/dL — ABNORMAL LOW (ref 0.61–1.24)
GFR, Estimated: >60 mL/min (ref >60)
Glucose, Bld: 200 mg/dL — ABNORMAL HIGH (ref 70–99)
Potassium: 4.3 mmol/L (ref 3.5–5.1)
Sodium: 145 mmol/L (ref 135–145)
Total Bilirubin: 0.6 mg/dL (ref 0.3–1.2)
Total Protein: 6.3 g/dL — ABNORMAL LOW (ref 6.5–8.1)

## 2020-08-13 LAB — GLUCOSE, CAPILLARY
Glucose-Capillary: 113 mg/dL — ABNORMAL HIGH (ref 70–99)
Glucose-Capillary: 134 mg/dL — ABNORMAL HIGH (ref 70–99)
Glucose-Capillary: 152 mg/dL — ABNORMAL HIGH (ref 70–99)
Glucose-Capillary: 166 mg/dL — ABNORMAL HIGH (ref 70–99)
Glucose-Capillary: 173 mg/dL — ABNORMAL HIGH (ref 70–99)
Glucose-Capillary: 199 mg/dL — ABNORMAL HIGH (ref 70–99)

## 2020-08-13 LAB — PHOSPHORUS: Phosphorus: 3.3 mg/dL (ref 2.5–4.6)

## 2020-08-13 LAB — FIBRIN DERIVATIVES D-DIMER (ARMC ONLY): Fibrin derivatives D-dimer (ARMC): 3694.88 ng/mL (FEU) — ABNORMAL HIGH (ref 0.00–499.00)

## 2020-08-13 LAB — C-REACTIVE PROTEIN: CRP: 5.7 mg/dL — ABNORMAL HIGH (ref <1.0)

## 2020-08-13 LAB — MAGNESIUM: Magnesium: 2.6 mg/dL — ABNORMAL HIGH (ref 1.7–2.4)

## 2020-08-13 LAB — FERRITIN: Ferritin: 968 ng/mL — ABNORMAL HIGH (ref 24–336)

## 2020-08-13 IMAGING — DX DG CHEST 1V PORT
1 series · 1 of 1 positions shown · non-contrast
Comparison: [DATE]

CLINICAL DATA: COVID positive, new intubation

EXAM:
PORTABLE CHEST 1 VIEW

[chest ap]
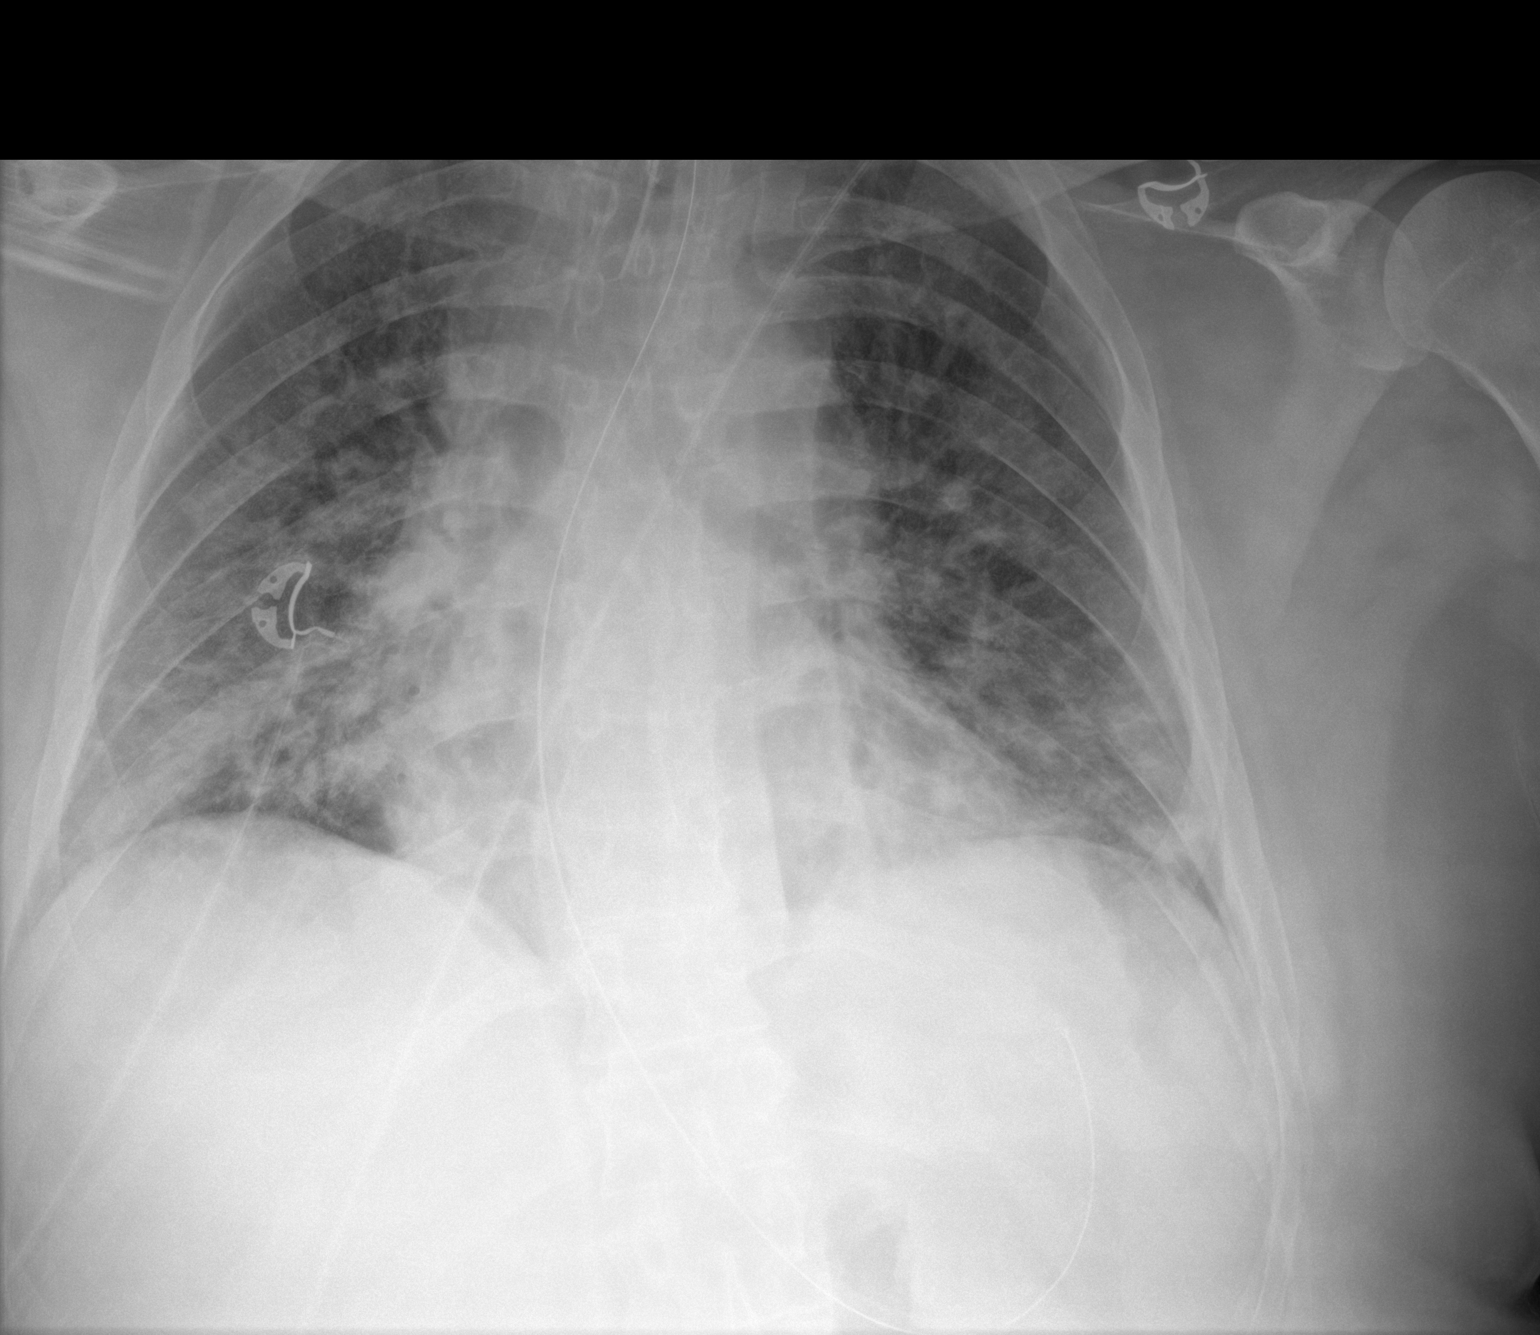

[1 of 1 positions shown; findings below may reference images not displayed]

FINDINGS: Interval repositioning of endotracheal tube, tip projecting over the
mid trachea in appropriate position. Esophagogastric tube with tip
and side port below the diaphragm, tip positioned in the gastric
fundus. No significant interval change in heterogeneous bilateral
airspace disease. Mild cardiomegaly.
IMPRESSION: 1. Interval repositioning of endotracheal tube, tip projecting over
the mid trachea in appropriate position.
2. Esophagogastric tube with tip and side port below the diaphragm,
tip positioned in the gastric fundus.
3. Unchanged heterogeneous bilateral airspace disease, consistent
COVID infection.

## 2020-08-13 IMAGING — DX DG ABDOMEN 1V
1 series · 1 of 1 positions shown · non-contrast
Comparison: Two days ago

CLINICAL DATA: COVID.  Intubation

EXAM:
ABDOMEN - 1 VIEW

[abdomen supine]
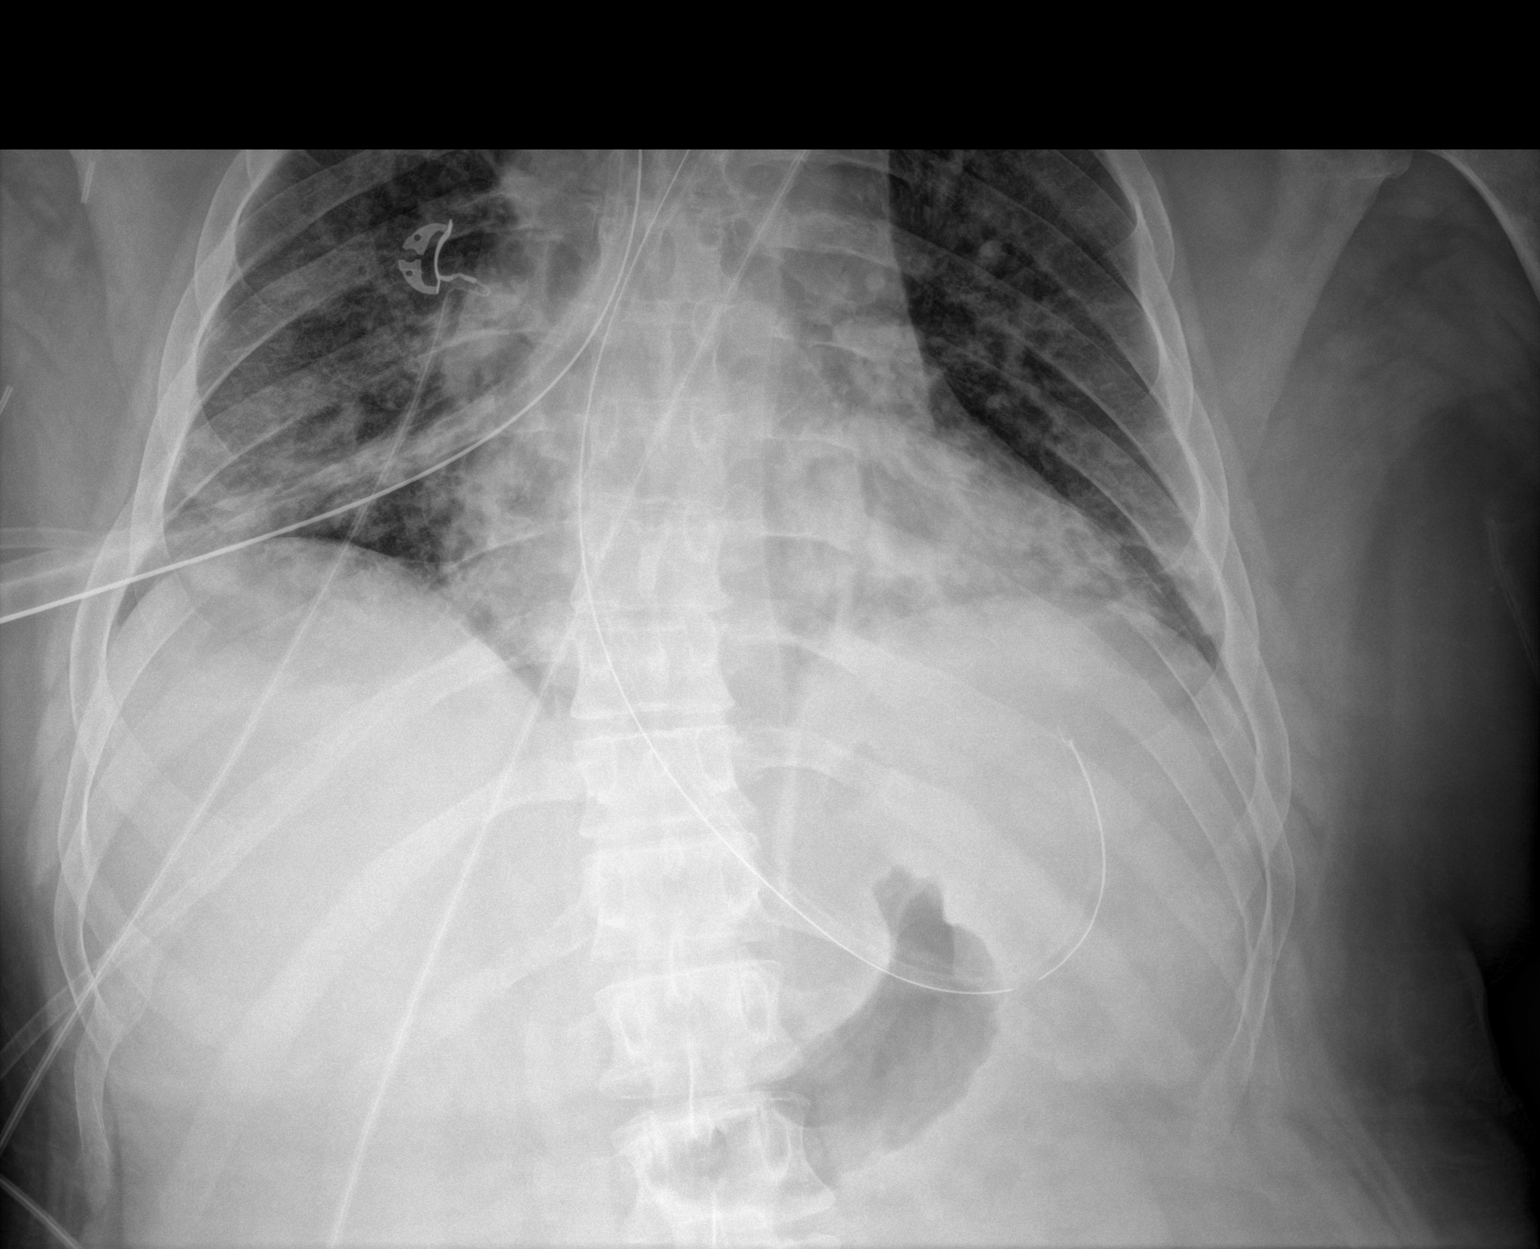

[1 of 1 positions shown; findings below may reference images not displayed]

FINDINGS: Enteric tube with tip and side-port over the upper stomach.

Endotracheal tube tip in the trachea.

Bilateral airspace disease. Cardiomegaly and vascular pedicle
widening.
IMPRESSION: Enteric tube in good position.

## 2020-08-13 MED ORDER — SODIUM CHLORIDE 0.9 % IV SOLN
100.0000 mg | Freq: Once | INTRAVENOUS | Status: AC
  Administered 2020-08-13: 100 mg via INTRAVENOUS
  Filled 2020-08-13: qty 20

## 2020-08-13 MED ORDER — DEXMEDETOMIDINE HCL IN NACL 400 MCG/100ML IV SOLN
0.4000 ug/kg/h | INTRAVENOUS | Status: DC
  Administered 2020-08-13: 1.2 ug/kg/h via INTRAVENOUS
  Administered 2020-08-14: 0.4 ug/kg/h via INTRAVENOUS
  Administered 2020-08-14 – 2020-08-17 (×13): 0.8 ug/kg/h via INTRAVENOUS
  Administered 2020-08-17: 0.4 ug/kg/h via INTRAVENOUS
  Administered 2020-08-17 (×2): 0.8 ug/kg/h via INTRAVENOUS
  Filled 2020-08-13 (×17): qty 100

## 2020-08-13 MED ORDER — VECURONIUM BROMIDE 10 MG IV SOLR
INTRAVENOUS | Status: AC
  Administered 2020-08-13: 10 mg via INTRAVENOUS
  Filled 2020-08-13: qty 10

## 2020-08-13 MED ORDER — ROCURONIUM BROMIDE 50 MG/5ML IV SOLN: 50.0000 mg | Freq: Once | INTRAVENOUS | Status: AC

## 2020-08-13 MED ORDER — ROCURONIUM BROMIDE 50 MG/5ML IV SOLN
INTRAVENOUS | Status: AC
  Administered 2020-08-13: 50 mg via INTRAVENOUS
  Filled 2020-08-13: qty 1

## 2020-08-13 MED ORDER — LORAZEPAM 2 MG/ML IJ SOLN: 4.0000 mg | Freq: Once | INTRAMUSCULAR | Status: AC

## 2020-08-13 MED ORDER — FENTANYL CITRATE (PF) 100 MCG/2ML IJ SOLN: 200.0000 ug | Freq: Once | INTRAMUSCULAR | Status: AC

## 2020-08-13 MED ORDER — MIDAZOLAM HCL 2 MG/2ML IJ SOLN
INTRAMUSCULAR | Status: AC
  Administered 2020-08-13: 4 mg via INTRAVENOUS
  Filled 2020-08-13: qty 4

## 2020-08-13 MED ORDER — MIDAZOLAM HCL 2 MG/2ML IJ SOLN: 4.0000 mg | Freq: Once | INTRAMUSCULAR | Status: AC

## 2020-08-13 MED ORDER — SENNOSIDES 8.8 MG/5ML PO SYRP
10.0000 mL | ORAL_SOLUTION | Freq: Two times a day (BID) | ORAL | Status: DC
  Administered 2020-08-13 – 2020-08-24 (×8): 10 mL
  Filled 2020-08-13 (×25): qty 10

## 2020-08-13 MED ORDER — FENTANYL CITRATE (PF) 100 MCG/2ML IJ SOLN
INTRAMUSCULAR | Status: AC
  Administered 2020-08-13: 200 ug via INTRAVENOUS
  Filled 2020-08-13: qty 4

## 2020-08-13 MED ORDER — VECURONIUM BROMIDE 10 MG IV SOLR
10.0000 mg | INTRAVENOUS | Status: DC | PRN
  Administered 2020-08-14 – 2020-08-17 (×14): 10 mg via INTRAVENOUS
  Filled 2020-08-13 (×14): qty 10

## 2020-08-13 MED ORDER — LORAZEPAM 2 MG/ML IJ SOLN
INTRAMUSCULAR | Status: AC
  Administered 2020-08-13: 4 mg
  Filled 2020-08-13: qty 2

## 2020-08-13 NOTE — Progress Notes (Signed)
RT called to room for Pt desat and increased agitation. Pt intubated ICU MD with size 7.0 ETT @ 24 at the lips, positive color change on Co2 detector, positive bilateral breaths sounds. Pt placed on previous ventilator settings. CXR ordered

## 2020-08-13 NOTE — Progress Notes (Signed)
Pt self extubated. Pt placed on HHFNC 50L 90% sats in mid 90's. Will continue to monitor closely, MD notified

## 2020-08-13 NOTE — Progress Notes (Signed)
CRITICAL CARE PROGRESS NOTE    Name: Craig [name] MRN: [id number] DOB: Jul 20, 1991  Referring physician: Dr Reesa Chew    LOS: 4  R  SUBJECTIVE FINDINGS & SIGNIFICANT EVENTS    Patient description:  29 yo male recent hospitalization for perforated diverticulitis s/p colostomy with reversal and leakage of anastomosis with s/p revision now acutely hypoxemic with COVID19 induced severe ARDS.    08/11/20-patient continued to decline with worsening respiratory status despite 100%Fio2 on BIPAP and HFNC failure.  He stated that he feels he is dying and cannot breathe.  I discussed with wife [name] earlier today that patient is critically ill may need ETT, she is agreeable and thankful for care. I specifically discussed and explained intubation and mechanical ventilation to patient and he wishes to proceed.    08/12/20- patient was weaned on FiO2 to 60%. Spoke to wife [name].   08/13/20- Patient weaned to 40%.  During SBT today he self extubated and was placed on HFNC.  He subsequently became hypoxemic, disoriented aggitated, removed PIVs and colostomy and proceeded to have combative behavior with worsening oxygenation. I have attempted to calm patient and encouraged him to cooperate.  After numerous attempts patient continued to be combative with severe aggitation with combative behavior and would not wear oxygen. Patient was placed on sedation and MV.  Wife updated.    Lines/tubes : Closed System Drain 1 Right Abdomen Bulb (JP) 19 Fr. (Active)     Colostomy LLQ (Active)  Stoma Assessment Pink;Red 08/10/20 0944  Peristomal Assessment Intact 08/10/20 0944    Microbiology/Sepsis markers: Results for orders placed or performed during the hospital encounter of 08/09/20  Culture, blood (Routine X 2) w Reflex to ID  Panel     Status: None (Preliminary result)   Collection Time: 08/09/20  9:06 PM   Specimen: BLOOD  Result Value Ref Range Status   Specimen Description BLOOD RIGHT ARM  Final   Special Requests   Final    BOTTLES DRAWN AEROBIC AND ANAEROBIC Blood Culture adequate volume   Culture   Final    NO GROWTH 3 DAYS Performed at [facility], [address], [place], [place] [address]    Report Status PENDING  Incomplete  Culture, blood (Routine X 2) w Reflex to ID Panel     Status: None (Preliminary result)   Collection Time: 08/09/20  9:06 PM   Specimen: BLOOD  Result Value Ref Range Status   Specimen Description BLOOD RIGHT [facility]  Final   Special Requests   Final    BOTTLES DRAWN AEROBIC AND ANAEROBIC Blood Culture adequate volume   Culture   Final    NO GROWTH 3 DAYS Performed at [facility], [address] [place], [place] [address]    Report Status PENDING  Incomplete  Resp Panel by RT-PCR (Flu A&B, Covid) Nasopharyngeal Swab     Status: Abnormal   Collection Time: 08/09/20  9:11 PM   Specimen: Nasopharyngeal Swab; Nasopharyngeal(NP) swabs in vial transport medium  Result Value Ref Range Status   SARS Coronavirus 2 by RT PCR POSITIVE (A) NEGATIVE Final    Comment: RESULT CALLED TO, READ BACK BY AND VERIFIED WITH: JANE MARTIN '@[id number]'  ON 08/09/20 SKL (NOTE) SARS-CoV-2 target nucleic acids are DETECTED.  The SARS-CoV-2 RNA is generally detectable in upper respiratory specimens during the acute phase of infection. Positive results are indicative of the presence of the identified virus, but do not rule out bacterial infection or co-infection with other pathogens not  detected by the test. Clinical correlation with patient history and other diagnostic information is necessary to determine patient infection status. The expected result is Negative.  Fact Sheet for Patients: EntrepreneurPulse.com.au  Fact Sheet for Healthcare  Providers: IncredibleEmployment.be  This test is not yet approved or cleared by the [place] FDA and  has been authorized for detection and/or diagnosis of SARS-CoV-2 by FDA under an Emergency Use Authorization (EUA).  This EUA will remain in effect (meaning this test can be  used) for the duration of  the COVID-19 declaration under Section 564(b)(1) of the Act, 21 U.S.C. section 360bbb-3(b)(1), unless the authorization is terminated or revoked sooner.     Influenza A by PCR NEGATIVE NEGATIVE Final   Influenza B by PCR NEGATIVE NEGATIVE Final    Comment: (NOTE) The Xpert Xpress SARS-CoV-2/FLU/RSV plus assay is intended as an aid in the diagnosis of influenza from Nasopharyngeal swab specimens and should not be used as a sole basis for treatment. Nasal washings and aspirates are unacceptable for Xpert Xpress SARS-CoV-2/FLU/RSV testing.  Fact Sheet for Patients: EntrepreneurPulse.com.au  Fact Sheet for Healthcare Providers: IncredibleEmployment.be  This test is not yet approved or cleared by the [place] FDA and has been authorized for detection and/or diagnosis of SARS-CoV-2 by FDA under an Emergency Use Authorization (EUA). This EUA will remain in effect (meaning this test can be used) for the duration of the COVID-19 declaration under Section 564(b)(1) of the Act, 21 U.S.C. section 360bbb-3(b)(1), unless the authorization is terminated or revoked.  Performed at [facility], [address], [place], [place] [address]   Urine culture     Status: None   Collection Time: 08/10/20  4:33 AM   Specimen: In/Out Cath Urine  Result Value Ref Range Status   Specimen Description   Final    IN/OUT CATH URINE Performed at [facility], [address], [place], [place] [address]    Special Requests   Final    NONE Performed at [facility], [address] [place], [place] [address]     Culture   Final    NO GROWTH Performed at [facility], [place] [address], [place], [place] [address]    Report Status 08/11/2020 FINAL  Final  MRSA PCR Screening     Status: None   Collection Time: 08/10/20 12:40 PM   Specimen: Nasopharyngeal  Result Value Ref Range Status   MRSA by PCR NEGATIVE NEGATIVE Final    Comment:        The GeneXpert MRSA Assay (FDA approved for NASAL specimens only), is one component of a comprehensive MRSA colonization surveillance program. It is not intended to diagnose MRSA infection nor to guide or monitor treatment for MRSA infections. Performed at [facility], [address], [place], [place] [address]     Anti-infectives:  Anti-infectives (From admission, onward)   Start     Dose/Rate Route Frequency Ordered Stop   08/11/20 0100  cefTRIAXone (ROCEPHIN) 2 g in sodium chloride 0.9 % 100 mL IVPB        2 g 200 mL/hr over 30 Minutes Intravenous Every 24 hours 08/10/20 0859     08/10/20 1000  remdesivir 100 mg in sodium chloride 0.9 % 100 mL IVPB       "Followed by" Linked Group Details   100 mg 200 mL/hr over 30 Minutes Intravenous Daily 08/09/20 2236 08/14/20 0959   08/10/20 0000  cefTRIAXone (ROCEPHIN) 2 g in sodium chloride 0.9 % 100  mL IVPB        2 g 200 mL/hr over 30 Minutes Intravenous  Once 08/09/20 2352 08/10/20 0125   08/09/20 2345  levofloxacin (LEVAQUIN) IVPB 750 mg  Status:  Discontinued        750 mg 100 mL/hr over 90 Minutes Intravenous  Once 08/09/20 2339 08/09/20 2351   08/09/20 2300  remdesivir 200 mg in sodium chloride 0.9% 250 mL IVPB       "Followed by" Linked Group Details   200 mg 580 mL/hr over 30 Minutes Intravenous Once 08/09/20 2236 08/10/20 0201   08/09/20 2300  azithromycin (ZITHROMAX) 500 mg in sodium chloride 0.9 % 250 mL IVPB        500 mg 250 mL/hr over 60 Minutes Intravenous Every 24 hours 08/10/20 0859 08/12/20 2259   08/09/20 2230  azithromycin (ZITHROMAX) 500 mg in sodium chloride 0.9 %  250 mL IVPB        500 mg 250 mL/hr over 60 Minutes Intravenous  Once 08/09/20 2227 08/10/20 0038        PAST MEDICAL HISTORY   Past Medical History:  Diagnosis Date  . Gout   . Hypertension   . Rupture of bowel [facility])      SURGICAL HISTORY   Past Surgical History:  Procedure Laterality Date  . COLONOSCOPY WITH PROPOFOL N/A 05/18/2020   Procedure: COLONOSCOPY WITH PROPOFOL;  Surgeon: Benjamine Sprague, DO;  Location: [facility] ENDOSCOPY;  Service: General;  Laterality: N/A;  . COLOSTOMY N/A 11/22/2019   Procedure: COLOSTOMY;  Surgeon: Herbert Pun, MD;  Location: [facility] ORS;  Service: General;  Laterality: N/A;  . LAPAROTOMY N/A 11/22/2019   Procedure: EXPLORATORY LAPAROTOMY;  Surgeon: Herbert Pun, MD;  Location: [facility] ORS;  Service: General;  Laterality: N/A;  . LAPAROTOMY N/A 07/08/2020   Procedure: EXPLORATORY LAPAROTOMY;  Surgeon: Herbert Pun, MD;  Location: [facility] ORS;  Service: General;  Laterality: N/A;  . PARTIAL COLECTOMY N/A 11/22/2019   Procedure: PARTIAL COLECTOMY;  Surgeon: Herbert Pun, MD;  Location: [facility] ORS;  Service: General;  Laterality: N/A;  . XI ROBOTIC ASSISTED COLOSTOMY TAKEDOWN N/A 06/28/2020   Procedure: XI ROBOTIC ASSISTED COLOSTOMY TAKEDOWN CONVERTED TO OPEN PROCEDURE;  Surgeon: Herbert Pun, MD;  Location: [facility] ORS;  Service: General;  Laterality: N/A;     FAMILY HISTORY   Family History  Problem Relation Age of Onset  . Healthy Mother   . Healthy Father      SOCIAL HISTORY   Social History   Tobacco Use  . Smoking status: Never Smoker  . Smokeless tobacco: Current User    Types: Chew  Vaping Use  . Vaping Use: Never used  Substance Use Topics  . Alcohol use: Yes    Comment: pint to a fifth per day per patient . Patient reports he hasn't had anything to drink in three weeks.  . Drug use: Never     MEDICATIONS   Current Medication:  Current Facility-Administered Medications:  .  0.9 %  sodium  chloride infusion, 250 mL, Intravenous, PRN, Opyd, Ilene Qua, MD, Last Rate: 5 mL/hr at 08/13/20 0600, Infusion Verify at 08/13/20 0600 .  acetaminophen (TYLENOL) tablet 650 mg, 650 mg, Per Tube, Q6H PRN, Lorella Nimrod, MD .  albuterol (VENTOLIN HFA) 108 (90 Base) MCG/ACT inhaler 2 puff, 2 puff, Inhalation, Q6H PRN, Opyd, Ilene Qua, MD .  allopurinol (ZYLOPRIM) tablet 300 mg, 300 mg, Per Tube, Daily, Amin, Sumayya, MD .  cefTRIAXone (ROCEPHIN) 2 g in sodium chloride 0.9 % 100  mL IVPB, 2 g, Intravenous, Q24H, Lorella Nimrod, MD, Stopped at 08/13/20 0032 .  chlorhexidine gluconate (MEDLINE KIT) (PERIDEX) 0.12 % solution 15 mL, 15 mL, Mouth Rinse, BID, Lanney Gins, Dewain Platz, MD, 15 mL at 08/12/20 2000 .  Chlorhexidine Gluconate Cloth 2 % PADS 6 each, 6 each, Topical, Daily, Ottie Glazier, MD, 6 each at 08/12/20 1243 .  chlorpheniramine-HYDROcodone (TUSSIONEX) 10-8 MG/5ML suspension 5 mL, 5 mL, Per Tube, Q12H PRN, Lorella Nimrod, MD .  dexamethasone (DECADRON) injection 6 mg, 6 mg, Intravenous, Q24H, Lanney Gins, Rayford Williamsen, MD, 6 mg at 08/12/20 1953 .  dexmedetomidine (PRECEDEX) 400 MCG/100ML (4 mcg/mL) infusion, 0.4-1.2 mcg/kg/hr, Intravenous, Titrated, Jamareon Shimel, MD, Last Rate: 35.4 mL/hr at 08/13/20 0947, 1.2 mcg/kg/hr at 08/13/20 0947 .  enoxaparin (LOVENOX) injection 60 mg, 0.5 mg/kg, Subcutaneous, Q2200, Opyd, Ilene Qua, MD, 60 mg at 08/12/20 2100 .  feeding supplement (PROSource TF) liquid 90 mL, 90 mL, Per Tube, 6 X Daily, Aavya Shafer, MD, 90 mL at 08/13/20 0504 .  feeding supplement (VITAL HIGH PROTEIN) liquid 1,000 mL, 1,000 mL, Per Tube, Q24H, Kaysia Willard, MD, 1,000 mL at 08/12/20 1426 .  fentaNYL (SUBLIMAZE) 100 MCG/2ML injection, , , ,  .  fentaNYL (SUBLIMAZE) injection 200 mcg, 200 mcg, Intravenous, Once, Sou Nohr, MD .  fentaNYL 2531mg in NS 254m(1066mml) infusion-PREMIX, 0-400 mcg/hr, Intravenous, Continuous, Burnell Matlin, MD, Last Rate: 20 mL/hr at 08/13/20 0600, 200 mcg/hr  at 08/13/20 0600 .  guaiFENesin-dextromethorphan (ROBITUSSIN DM) 100-10 MG/5ML syrup 10 mL, 10 mL, Per Tube, Q4H PRN, AmiLorella NimrodD .  HYDROcodone-acetaminophen (NORCO/VICODIN) 5-325 MG per tablet 1-2 tablet, 1-2 tablet, Per Tube, Q4H PRN, Amin, Sumayya, MD .  insulin aspart (novoLOG) injection 0-20 Units, 0-20 Units, Subcutaneous, Q4H, KeeDarel Hong NP, 4 Units at 08/13/20 0329 .  LORazepam (ATIVAN) 2 MG/ML injection, , , ,  .  LORazepam (ATIVAN) injection 4 mg, 4 mg, Intravenous, Once, Keelon Zurn, MD .  MEDLINE mouth rinse, 15 mL, Mouth Rinse, 10 times per day, AleOttie GlazierD, 15 mL at 08/13/20 0600 .  midazolam (VERSED) 2 MG/2ML injection, , , ,  .  midazolam (VERSED) injection 4 mg, 4 mg, Intravenous, Once, AleOttie GlazierD .  multivitamin liquid 15 mL, 15 mL, Per Tube, Daily, Lorelie Biermann, MD .  ondansetron (ZOFRAN) tablet 4 mg, 4 mg, Oral, Q6H PRN **OR** ondansetron (ZOFRAN) injection 4 mg, 4 mg, Intravenous, Q6H PRN, Opyd, Timothy S, MD .  pantoprazole sodium (PROTONIX) 40 mg/20 mL oral suspension 40 mg, 40 mg, Per Tube, Q2200, AleOttie GlazierD, 40 mg at 08/12/20 2100 .  propofol (DIPRIVAN) 1000 MG/100ML infusion, 5-80 mcg/kg/min, Intravenous, Titrated, Anishka Bushard, MD, Last Rate: 35.4 mL/hr at 08/13/20 0600, 50 mcg/kg/min at 08/13/20 0600 .  [COMPLETED] remdesivir 200 mg in sodium chloride 0.9% 250 mL IVPB, 200 mg, Intravenous, Once, Stopped at 08/10/20 0201 **FOLLOWED BY** remdesivir 100 mg in sodium chloride 0.9 % 100 mL IVPB, 100 mg, Intravenous, Daily, Opyd, TimIlene QuaD, Stopped at 08/12/20 1028 .  rocuronium (ZEMURON) 50 MG/5ML injection, , , ,  .  rocuronium (ZEMURON) injection 50 mg, 50 mg, Intravenous, Once, Baillie Mohammad, MD .  sodium chloride flush (NS) 0.9 % injection 10-40 mL, 10-40 mL, Intracatheter, Q12H, AmiLorella NimrodD, 20 mL at 08/12/20 2104 .  sodium chloride flush (NS) 0.9 % injection 10-40 mL, 10-40 mL, Intracatheter, PRN, AmiLorella NimrodD .  thiamine tablet 100 mg, 100 mg, Per Tube, Daily, AleOttie GlazierD    ALLERGIES  Amoxicillin    REVIEW OF SYSTEMS     Unable to obtain due to sedation on precedex drip  PHYSICAL EXAMINATION   Vital Signs: Temp:  [97.7 F (36.5 C)-99.1 F (37.3 C)] 97.88 F (36.6 C) (12/25 0800) Pulse Rate:  [71-114] 71 (12/25 0800) Resp:  [11-36] 21 (12/25 0800) BP: (102-123)/(57-69) 122/68 (12/25 0800) SpO2:  [87 %-98 %] 95 % (12/25 0907) FiO2 (%):  [35 %-90 %] 90 % (12/25 0907)  GENERAL:age appropriate sedated HEAD: Normocephalic, atraumatic.  EYES: Pupils equal, round, reactive to light.  No scleral icterus. ETT+ MOUTH: dryt mucosal membrane. NECK: Supple. No thyromegaly. No nodules. No JVD.  PULMONARY: ronchi bilaterally  CARDIOVASCULAR: S1 and S2. Regular rate and rhythm. No murmurs, rubs, or gallops.  GASTROINTESTINAL: Soft, nontender, non-distended. No masses. Positive bowel sounds. No hepatosplenomegaly.  MUSCULOSKELETAL: No swelling, clubbing, or edema.  NEUROLOGIC: GCS4T SKIN:intact,warm,dry   PERTINENT DATA     Infusions: . sodium chloride 5 mL/hr at 08/13/20 0600  . cefTRIAXone (ROCEPHIN)  IV Stopped (08/13/20 0032)  . dexmedetomidine (PRECEDEX) IV infusion 1.2 mcg/kg/hr (08/13/20 0947)  . fentaNYL infusion INTRAVENOUS 200 mcg/hr (08/13/20 0600)  . propofol (DIPRIVAN) infusion 50 mcg/kg/min (08/13/20 0600)  . remdesivir 100 mg in NS 100 mL Stopped (08/12/20 1028)   Scheduled Medications: . allopurinol  300 mg Per Tube Daily  . chlorhexidine gluconate (MEDLINE KIT)  15 mL Mouth Rinse BID  . Chlorhexidine Gluconate Cloth  6 each Topical Daily  . dexamethasone (DECADRON) injection  6 mg Intravenous Q24H  . enoxaparin (LOVENOX) injection  0.5 mg/kg Subcutaneous Q2200  . feeding supplement (PROSource TF)  90 mL Per Tube 6 X Daily  . feeding supplement (VITAL HIGH PROTEIN)  1,000 mL Per Tube Q24H  . fentaNYL      . fentaNYL (SUBLIMAZE) injection   200 mcg Intravenous Once  . insulin aspart  0-20 Units Subcutaneous Q4H  . LORazepam      . LORazepam  4 mg Intravenous Once  . mouth rinse  15 mL Mouth Rinse 10 times per day  . midazolam      . midazolam  4 mg Intravenous Once  . multivitamin  15 mL Per Tube Daily  . pantoprazole sodium  40 mg Per Tube Q2200  . rocuronium      . rocuronium  50 mg Intravenous Once  . sodium chloride flush  10-40 mL Intracatheter Q12H  . thiamine  100 mg Per Tube Daily   PRN Medications: sodium chloride, acetaminophen, albuterol, chlorpheniramine-HYDROcodone, guaiFENesin-dextromethorphan, HYDROcodone-acetaminophen, ondansetron **OR** ondansetron (ZOFRAN) IV, sodium chloride flush Hemodynamic parameters:   Intake/Output: 12/24 0701 - 12/25 0700 In: 1570.8 [I.V.:1370.8; IV Piggyback:200] Out: 2300 [Urine:2300]  Ventilator  Settings: Vent Mode: PRVC FiO2 (%):  [35 %-90 %] 90 % Set Rate:  [20 bmp] 20 bmp Vt Set:  [500 mL] 500 mL PEEP:  [16 cmH20] 16 cmH20 Plateau Pressure:  [24 cmH20] 24 cmH20    LAB RESULTS:  Basic Metabolic Panel: Recent Labs  Lab 08/09/20 2111 08/10/20 0433 08/11/20 0636 08/12/20 0409 08/13/20 0314  NA 133* 139 140 145 145  K 4.4 5.8* 4.4 5.3* 4.3  CL 95* 100 102 107 108  CO2 '22 29 26 29 27  ' GLUCOSE 157* 287* 194* 179* 200*  BUN '7 9 15 ' 25* 37*  CREATININE 1.10 0.69 0.50* 0.75 0.60*  CALCIUM 8.4* 8.9 8.9 8.6* 8.6*  MG  --  2.1  --   --  2.6*  PHOS  --  2.6  --   --  3.3   Liver Function Tests: Recent Labs  Lab 08/09/20 2111 08/10/20 0433 08/11/20 0636 08/12/20 0409 08/13/20 0314  AST 53* 41 '28 23 19  ' ALT 32 '28 24 21 19  ' ALKPHOS 53 49 46 40 37*  BILITOT 1.5* 0.5 0.7 0.5 0.6  PROT 7.6 6.9 6.8 6.6 6.3*  ALBUMIN 3.5 2.8* 2.9* 2.8* 2.7*   No results for input(s): LIPASE, AMYLASE in the last 168 hours. No results for input(s): AMMONIA in the last 168 hours. CBC: Recent Labs  Lab 08/09/20 2111 08/10/20 0433 08/11/20 0636 08/12/20 0409 08/13/20 0314   WBC 12.3* 5.8 7.6 6.2 5.8  NEUTROABS 10.7* 5.1 6.7 5.4 5.0  HGB 12.8* 12.1* 11.8* 10.4* 9.8*  HCT 40.2 36.9* 36.9* 34.3* 32.8*  MCV 90.7 92.5 91.6 96.1 97.3  PLT 160 112* 133* 151 145*   Cardiac Enzymes: No results for input(s): CKTOTAL, CKMB, CKMBINDEX, TROPONINI in the last 168 hours. BNP: Invalid input(s): POCBNP CBG: Recent Labs  Lab 08/12/20 1642 08/12/20 1937 08/12/20 2300 08/13/20 0314 08/13/20 0822  GLUCAP 164* 147* 153* 173* 199*       IMAGING RESULTS:  Imaging: DG Chest 1 View  Result Date: 08/11/2020 CLINICAL DATA:  Check endotracheal tube placement EXAM: CHEST  1 VIEW COMPARISON:  08/10/2019 FINDINGS: Endotracheal tube is noted just below the thoracic inlet. Gastric catheter extends into the stomach. Cardiac shadow remains enlarged. Central vascular congestion and edema is noted. Bony abnormality is seen. IMPRESSION: Tubes and lines as described. Vascular congestion with edema. Electronically Signed   By: Inez Catalina M.D.   On: 08/11/2020 15:38   DG Abd 1 View  Result Date: 08/11/2020 CLINICAL DATA:  Check gastric catheter placement EXAM: ABDOMEN - 1 VIEW COMPARISON:  None. FINDINGS: Gastric catheter is noted within the stomach. Proximal side port lies at the gastroesophageal junction. This could be advanced deeper into the stomach. No obstructive changes are seen. IMPRESSION: Gastric catheter as described. This could be advanced further into the stomach. Electronically Signed   By: Inez Catalina M.D.   On: 08/11/2020 15:44   '@[email or web address]' @ No results found.   ASSESSMENT AND PLAN    -Multidisciplinary rounds held today  Acute Hypoxic Respiratory Failure Acute COVID19 pneumonia with severe ARDS -Remdesevir antiviral - pharmacy protocol 5 d -vitamin C -zinc -decadron 33m IV daily  -Diuresis - Lasix 40 IV daily - monitor UOP - utilize external urinary catheter if possible- -Self prone if patient can tolerate  -d/c hepatotoxic medications while on  remdesevir -baricitinib per covid protocol -supportive care with ICU telemetry monitoring -PT/OT when possible -procalcitonin, CRP and ferritin trending -baricitinib per protocol      Perforated diverticulitis   - s/p colostomy     - may present problem with proning protocol for COVID    - reviewed care plan with surgeon today - Dr CWindell Moment- appreciate input ICU telemetry  Monitoring   ID -continue IV abx as prescibed -follow up cultures  GI/Nutrition GI PROPHYLAXIS as indicated DIET-->TF's as tolerated Constipation protocol as indicated  ENDO - ICU hypoglycemic\Hyperglycemia protocol -check FSBS per protocol   ELECTROLYTES -follow labs as needed -replace as needed -pharmacy consultation   DVT/GI PRX ordered -SCDs  TRANSFUSIONS AS NEEDED MONITOR FSBS ASSESS the need for LABS as needed   Critical care provider statement:    Critical care time (minutes):  109   Critical care time was exclusive of:  Separately billable procedures and treating other patients   Critical care was necessary to treat or  prevent imminent or life-threatening deterioration of the following conditions:  severe acute hypoxemic respiratory failure, severe ARDS, perforated diverticulitis.    Critical care was time spent personally by me on the following activities:  Development of treatment plan with patient or surrogate, discussions with consultants, evaluation of patient's response to treatment, examination of patient, obtaining history from patient or surrogate, ordering and performing treatments and interventions, ordering and review of laboratory studies and re-evaluation of patient's condition.  I assumed direction of critical care for this patient from another provider in my specialty: no    This document was prepared using Dragon voice recognition software and may include unintentional dictation errors.    Ottie Glazier, M.D.  Division of [facility]

## 2020-08-13 NOTE — Progress Notes (Signed)
Post self-extubation, patient placed on HFNC.  Pt proceeded to become agitated, pulled out two peripheral IVs and continually removed HFNC.  Mitts placed on patient, and attempts to redirect were unsuccessful.  MD notified, Precedex started.  During agitation, patient dislodged ostomy appliance.  Patient bathed, linens changed, during bathing, patient became increasingly agitated and aggressive/combative, grasping at NT and RN.  Soft wrist restraints applied, pt became increasingly erratic, MD and Charge RN at bedside, attempting to calm patient.  Orders placed for Ativan.  Patient continued to be aggressive and violent, scratching, grasping, and biting.  Unable to maintain O2 sat without nasal cannula in place.

## 2020-08-14 ENCOUNTER — Inpatient Hospital Stay: Payer: No Typology Code available for payment source

## 2020-08-14 LAB — COMPREHENSIVE METABOLIC PANEL
ALT: 17 U/L (ref 0–44)
AST: 17 U/L (ref 15–41)
Albumin: 2.8 g/dL — ABNORMAL LOW (ref 3.5–5.0)
Alkaline Phosphatase: 34 U/L — ABNORMAL LOW (ref 38–126)
Anion gap: 7 (ref 5–15)
BUN: 25 mg/dL — ABNORMAL HIGH (ref 6–20)
CO2: 31 mmol/L (ref 22–32)
Calcium: 8.6 mg/dL — ABNORMAL LOW (ref 8.9–10.3)
Chloride: 107 mmol/L (ref 98–111)
Creatinine, Ser: 0.51 mg/dL — ABNORMAL LOW (ref 0.61–1.24)
GFR, Estimated: >60 mL/min (ref >60)
Glucose, Bld: 189 mg/dL — ABNORMAL HIGH (ref 70–99)
Potassium: 4.4 mmol/L (ref 3.5–5.1)
Sodium: 145 mmol/L (ref 135–145)
Total Bilirubin: 0.7 mg/dL (ref 0.3–1.2)
Total Protein: 6.1 g/dL — ABNORMAL LOW (ref 6.5–8.1)

## 2020-08-14 LAB — CBC WITH DIFFERENTIAL/PLATELET
Abs Immature Granulocytes: 0.04 10*3/uL (ref 0.00–0.07)
Basophils Absolute: 0 10*3/uL (ref 0.0–0.1)
Basophils Relative: 0 %
Eosinophils Absolute: 0 10*3/uL (ref 0.0–0.5)
Eosinophils Relative: 0 %
HCT: 31.7 % — ABNORMAL LOW (ref 39.0–52.0)
Hemoglobin: 9.8 g/dL — ABNORMAL LOW (ref 13.0–17.0)
Immature Granulocytes: 1 %
Lymphocytes Relative: 5 %
Lymphs Abs: 0.3 10*3/uL — ABNORMAL LOW (ref 0.7–4.0)
MCH: 29.9 pg (ref 26.0–34.0)
MCHC: 30.9 g/dL (ref 30.0–36.0)
MCV: 96.6 fL (ref 80.0–100.0)
Monocytes Absolute: 0.2 10*3/uL (ref 0.1–1.0)
Monocytes Relative: 3 %
Neutro Abs: 6.8 10*3/uL (ref 1.7–7.7)
Neutrophils Relative %: 91 %
Platelets: 152 10*3/uL (ref 150–400)
RBC: 3.28 MIL/uL — ABNORMAL LOW (ref 4.22–5.81)
RDW: 15.4 % (ref 11.5–15.5)
WBC: 7.4 10*3/uL (ref 4.0–10.5)
nRBC: 0 % (ref 0.0–0.2)

## 2020-08-14 LAB — GLUCOSE, CAPILLARY
Glucose-Capillary: 178 mg/dL — ABNORMAL HIGH (ref 70–99)
Glucose-Capillary: 158 mg/dL — ABNORMAL HIGH (ref 70–99)
Glucose-Capillary: 144 mg/dL — ABNORMAL HIGH (ref 70–99)
Glucose-Capillary: 128 mg/dL — ABNORMAL HIGH (ref 70–99)
Glucose-Capillary: 114 mg/dL — ABNORMAL HIGH (ref 70–99)

## 2020-08-14 LAB — FIBRIN DERIVATIVES D-DIMER (ARMC ONLY): Fibrin derivatives D-dimer (ARMC): 4613.1 ng/mL (FEU) — ABNORMAL HIGH (ref 0.00–499.00)

## 2020-08-14 LAB — TRIGLYCERIDES: Triglycerides: 487 mg/dL — ABNORMAL HIGH (ref <150)

## 2020-08-14 LAB — C-REACTIVE PROTEIN: CRP: 8.4 mg/dL — ABNORMAL HIGH (ref <1.0)

## 2020-08-14 LAB — PHOSPHORUS: Phosphorus: 2.7 mg/dL (ref 2.5–4.6)

## 2020-08-14 LAB — FERRITIN: Ferritin: 808 ng/mL — ABNORMAL HIGH (ref 24–336)

## 2020-08-14 LAB — MAGNESIUM: Magnesium: 2.4 mg/dL (ref 1.7–2.4)

## 2020-08-14 IMAGING — DX DG CHEST 1V PORT
2 series · 2 of 2 positions shown · non-contrast
Comparison: [DATE]

CLINICAL DATA: Acute respiratory failure.

EXAM:
PORTABLE CHEST 1 VIEW

[chest ap (1 of 2)]
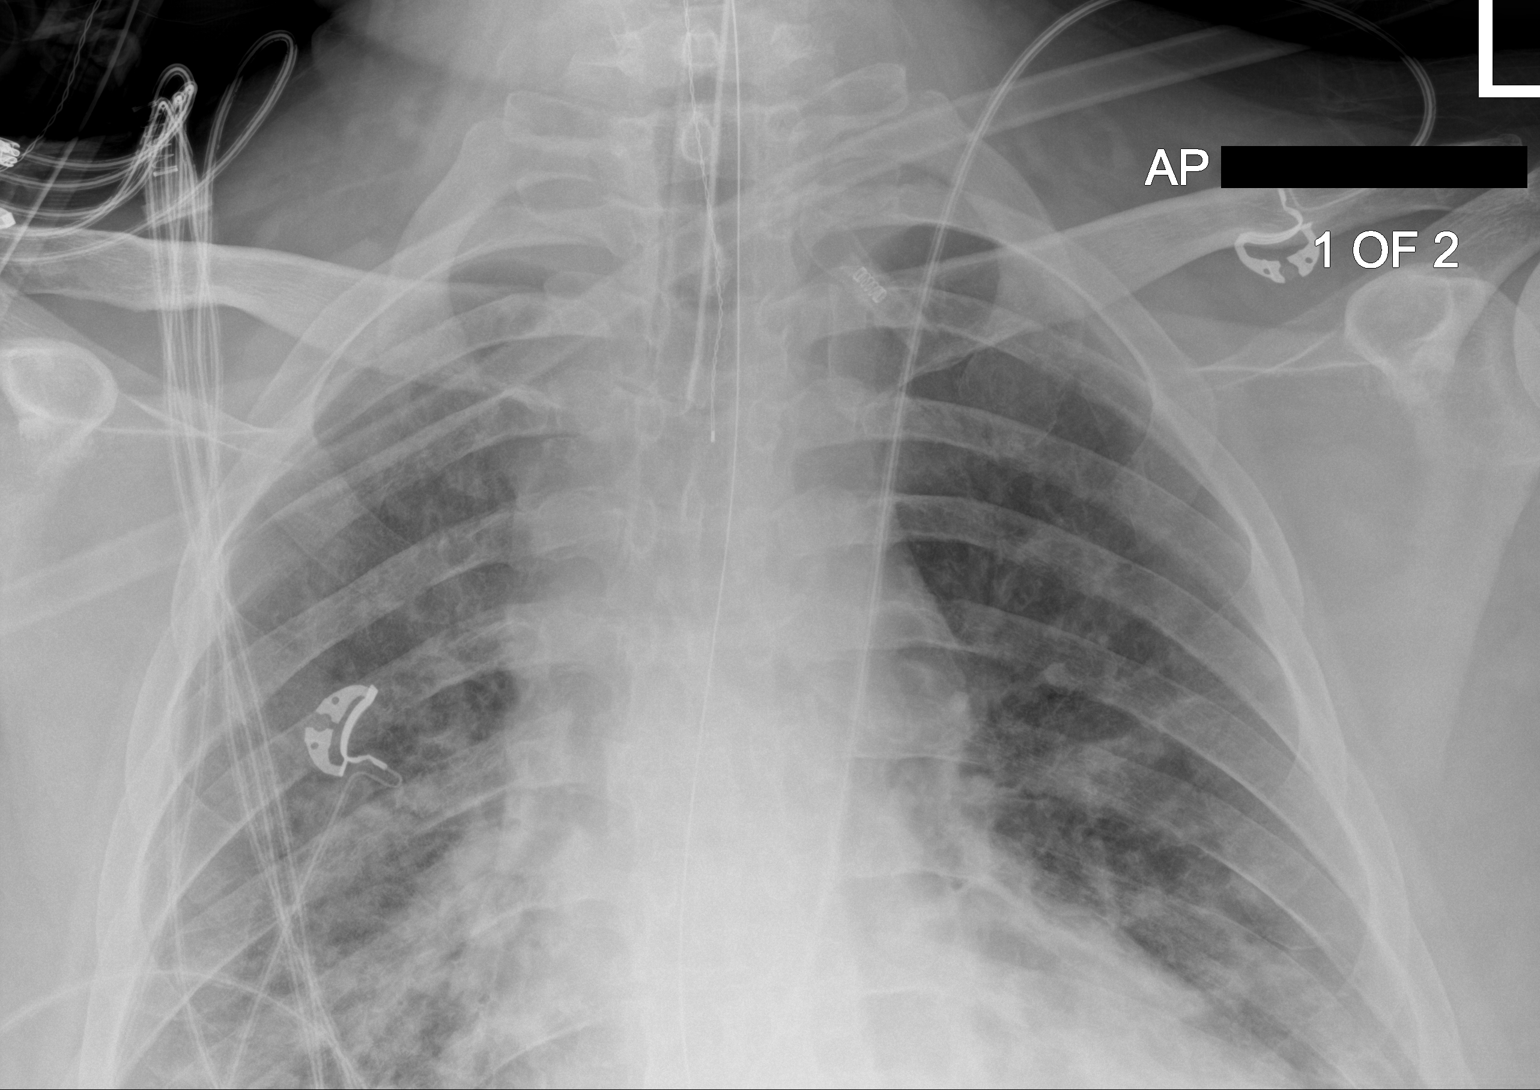

[chest ap (2 of 2)]
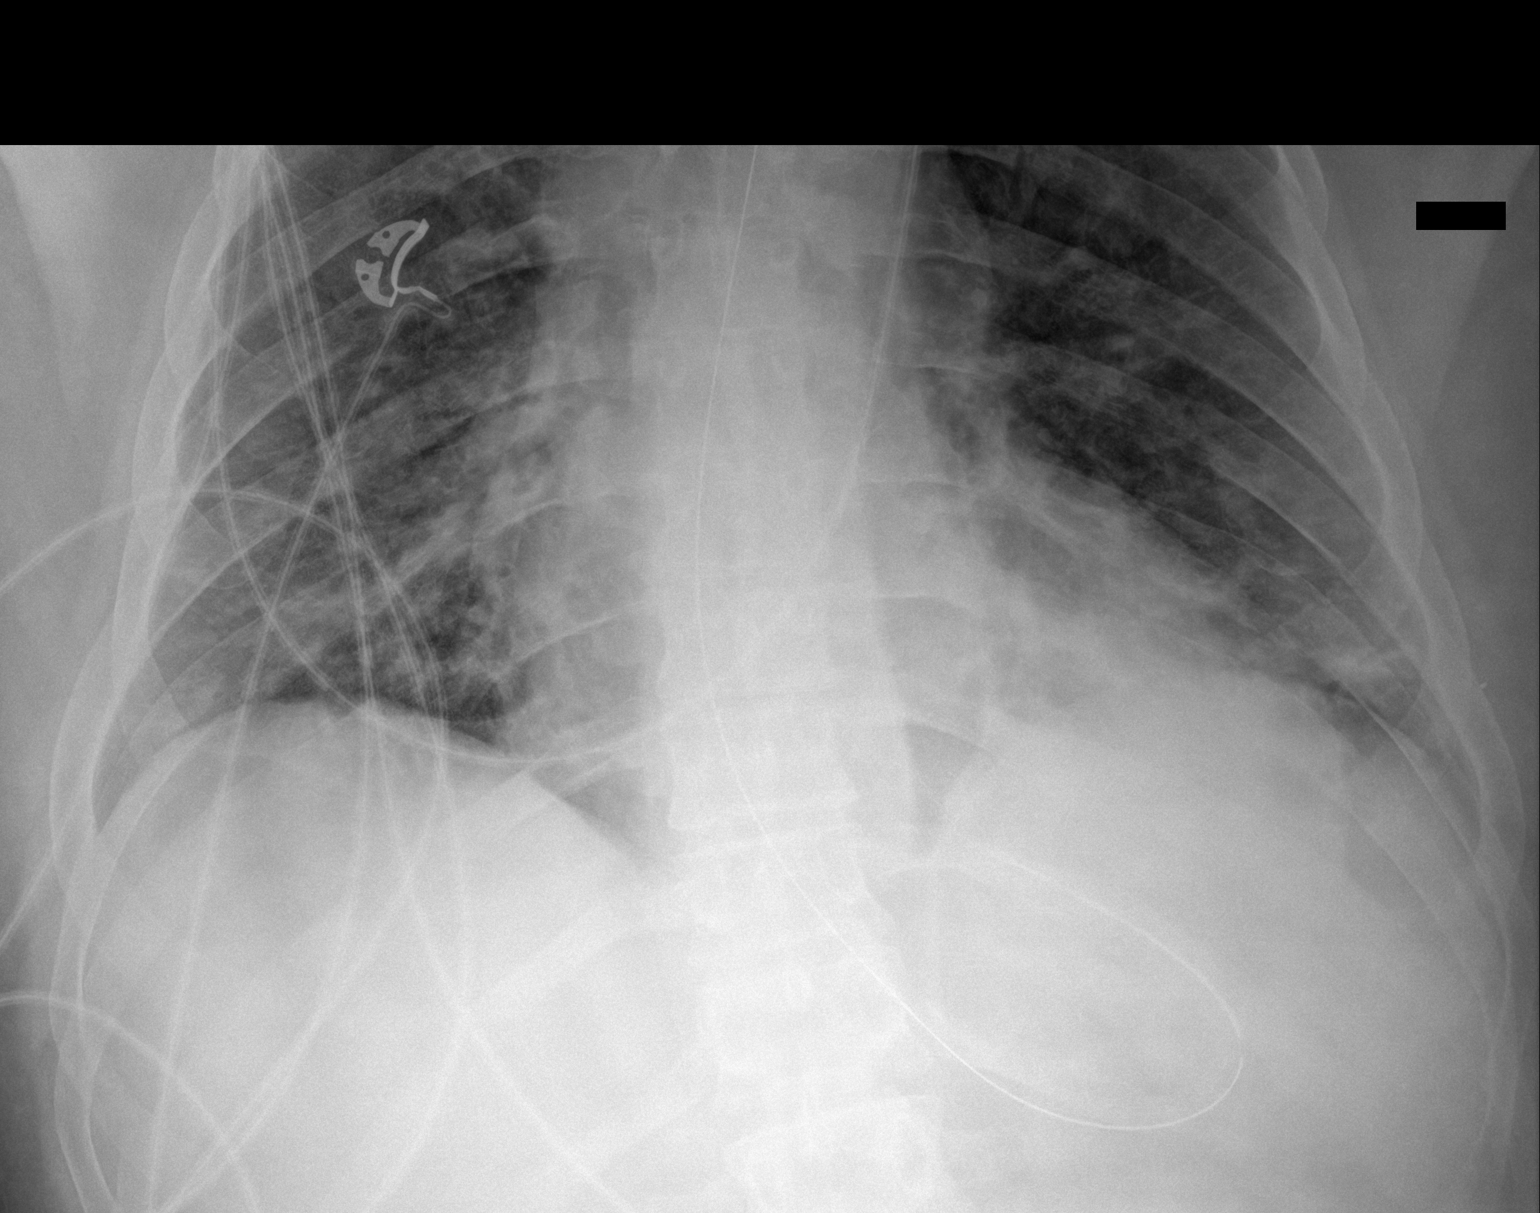

[2 of 2 positions shown; findings below may reference images not displayed]

FINDINGS: [CC] hours. Endotracheal tube tip is 4.4 cm above the base of the
carina. The NG tube tip is in the stomach. Esophageal temperature
probe evident. Diffuse patchy bilateral airspace disease is similar
to prior. No substantial pleural effusion. Telemetry leads overlie
the chest.
IMPRESSION: 1. Stable exam. Patchy bilateral airspace disease.
2. Support apparatus as described.

## 2020-08-14 MED ORDER — MIDAZOLAM HCL 2 MG/2ML IJ SOLN
2.0000 mg | INTRAMUSCULAR | Status: DC | PRN
  Administered 2020-08-14 – 2020-08-31 (×9): 2 mg via INTRAVENOUS
  Filled 2020-08-14 (×5): qty 2

## 2020-08-14 MED ORDER — MIDAZOLAM 50MG/50ML (1MG/ML) PREMIX INFUSION
0.5000 mg/h | INTRAVENOUS | Status: DC
  Administered 2020-08-14: 1 mg/h via INTRAVENOUS
  Administered 2020-08-14: 6 mg/h via INTRAVENOUS
  Administered 2020-08-15: 10 mg/h via INTRAVENOUS
  Administered 2020-08-15: 7 mg/h via INTRAVENOUS
  Administered 2020-08-15 – 2020-08-18 (×13): 10 mg/h via INTRAVENOUS
  Administered 2020-08-18: 9 mg/h via INTRAVENOUS
  Administered 2020-08-18 – 2020-08-19 (×6): 10 mg/h via INTRAVENOUS
  Administered 2020-08-19: 9 mg/h via INTRAVENOUS
  Administered 2020-08-20 (×4): 10 mg/h via INTRAVENOUS
  Administered 2020-08-21: 8 mg/h via INTRAVENOUS
  Administered 2020-08-21 (×2): 10 mg/h via INTRAVENOUS
  Administered 2020-08-21 – 2020-08-23 (×5): 8 mg/h via INTRAVENOUS
  Administered 2020-08-23: 9 mg/h via INTRAVENOUS
  Administered 2020-08-23: 8 mg/h via INTRAVENOUS
  Administered 2020-08-23: 10 mg/h via INTRAVENOUS
  Administered 2020-08-23: 9 mg/h via INTRAVENOUS
  Administered 2020-08-24: 10 mg/h via INTRAVENOUS
  Administered 2020-08-24: 10.5 mg/h via INTRAVENOUS
  Administered 2020-08-24 (×3): 10 mg/h via INTRAVENOUS
  Administered 2020-08-25: 14 mg/h via INTRAVENOUS
  Administered 2020-08-25: 20 mg/h via INTRAVENOUS
  Administered 2020-08-25: 12.5 mg/h via INTRAVENOUS
  Administered 2020-08-25: 15 mg/h via INTRAVENOUS
  Administered 2020-08-25: 16 mg/h via INTRAVENOUS
  Administered 2020-08-25 – 2020-08-26 (×4): 20 mg/h via INTRAVENOUS
  Administered 2020-08-26: 18 mg/h via INTRAVENOUS
  Administered 2020-08-26 (×2): 20 mg/h via INTRAVENOUS
  Administered 2020-08-26: 18 mg/h via INTRAVENOUS
  Administered 2020-08-26: 20 mg/h via INTRAVENOUS
  Administered 2020-08-26 (×2): 18 mg/h via INTRAVENOUS
  Administered 2020-08-26: 20 mg/h via INTRAVENOUS
  Administered 2020-08-27: 16 mg/h via INTRAVENOUS
  Administered 2020-08-27: 12 mg/h via INTRAVENOUS
  Administered 2020-08-27 (×2): 10 mg/h via INTRAVENOUS
  Administered 2020-08-27: 14 mg/h via INTRAVENOUS
  Administered 2020-08-28 (×2): 10 mg/h via INTRAVENOUS
  Administered 2020-08-28 (×2): 8 mg/h via INTRAVENOUS
  Administered 2020-08-29 (×2): 14 mg/h via INTRAVENOUS
  Administered 2020-08-29 (×2): 6 mg/h via INTRAVENOUS
  Administered 2020-08-30 (×2): 12 mg/h via INTRAVENOUS
  Administered 2020-08-30: 14 mg/h via INTRAVENOUS
  Administered 2020-08-30: 10 mg/h via INTRAVENOUS
  Administered 2020-08-30: 11 mg/h via INTRAVENOUS
  Administered 2020-08-31 – 2020-09-05 (×22): 10 mg/h via INTRAVENOUS
  Administered 2020-09-05: 11.5 mg/h via INTRAVENOUS
  Filled 2020-08-14 (×40): qty 50
  Filled 2020-08-14: qty 100
  Filled 2020-08-14 (×14): qty 50
  Filled 2020-08-14: qty 100
  Filled 2020-08-14 (×48): qty 50

## 2020-08-14 NOTE — Procedures (Signed)
Endotracheal Intubation:  Patient required placement of an artificial airway secondary to Respiratory Failure   Indication:  Patient self extubated with subsequent severe hypoxemic respiratory failure and aggitation.   Consent: Emergent.   Date of service: 08/13/20  Hand washing performed prior to starting the procedure.   Medications administered for sedation prior to procedure:  Midazolam 4 mg IV,  Rocuronium 40 mg IV, Fentanyl 100 mcg IV.       A time out procedure was called and correct patient, name, & ID confirmed. Needed supplies and equipment were assembled and checked to include ETT, 10 ml syringe, Glidescope, Mac and Miller blades, suction, oxygen and bag mask valve, end tidal CO2 monitor.   Patient was positioned to align the mouth and pharynx to facilitate visualization of the glottis.   Heart rate, SpO2 and blood pressure was continuously monitored during the procedure. Pre-oxygenation was conducted prior to intubation and endotracheal tube was placed through the vocal cords into the trachea.     The artificial airway was placed under direct visualization via glidescope route using a 7.0 ETT on the first attempt.  ETT was secured at 23 cm mark.  Placement was confirmed by auscuitation of lungs with good breath sounds bilaterally and no stomach sounds.  Condensation was noted on endotracheal tube.   Pulse ox 98%.  CO2 detector in place with appropriate color change.   Complications: None .     Chest radiograph ordered and pending.   Comments: OGT placed via glidescope.    Ottie Glazier, M.D.  [facility] [place]

## 2020-08-14 NOTE — Progress Notes (Signed)
CRITICAL CARE PROGRESS NOTE    Name: Craig [name] MRN: [id number] DOB: [birth year]  Referring physician: Dr Reesa Chew    LOS: 5  R  SUBJECTIVE FINDINGS & SIGNIFICANT EVENTS    Patient description:  29 yo male recent hospitalization for perforated diverticulitis s/p colostomy with reversal and leakage of anastomosis with s/p revision now acutely hypoxemic with COVID19 induced severe ARDS.    08/11/20-patient continued to decline with worsening respiratory status despite 100%Fio2 on BIPAP and HFNC failure.  He stated that he feels he is dying and cannot breathe.  I discussed with wife [name] earlier today that patient is critically ill may need ETT, she is agreeable and thankful for care. I specifically discussed and explained intubation and mechanical ventilation to patient and he wishes to proceed.    08/12/20- patient was weaned on FiO2 to 60%. Spoke to wife [name].   08/13/20- Patient weaned to 40%.  During SBT today he self extubated and was placed on HFNC.  He subsequently became hypoxemic, disoriented aggitated, removed PIVs and colostomy and proceeded to have combative behavior with worsening oxygenation. I have attempted to calm patient and encouraged him to cooperate.  After numerous attempts patient continued to be combative with severe aggitation with combative behavior and would not wear oxygen. Patient was placed on sedation and MV.  Wife updated.    08/14/20- patient remains critically ill.  Weaned from 100%>>55%. Called and updated wife [name] today  Lines/tubes : Closed System Drain 1 Right Abdomen Bulb (JP) 19 Fr. (Active)     Colostomy LLQ (Active)  Stoma Assessment Pink;Red 08/10/20 0944  Peristomal Assessment Intact 08/10/20 0944    Microbiology/Sepsis markers: Results for orders  placed or performed during the hospital encounter of 08/09/20  Culture, blood (Routine X 2) w Reflex to ID Panel     Status: None (Preliminary result)   Collection Time: 08/09/20  9:06 PM   Specimen: BLOOD  Result Value Ref Range Status   Specimen Description BLOOD RIGHT ARM  Final   Special Requests   Final    BOTTLES DRAWN AEROBIC AND ANAEROBIC Blood Culture adequate volume   Culture   Final    NO GROWTH 4 DAYS Performed at [facility], [address], [place], [place] [address]    Report Status PENDING  Incomplete  Culture, blood (Routine X 2) w Reflex to ID Panel     Status: None (Preliminary result)   Collection Time: 08/09/20  9:06 PM   Specimen: BLOOD  Result Value Ref Range Status   Specimen Description BLOOD RIGHT [facility]  Final   Special Requests   Final    BOTTLES DRAWN AEROBIC AND ANAEROBIC Blood Culture adequate volume   Culture   Final    NO GROWTH 4 DAYS Performed at [facility], [address], [place], [place] [address]    Report Status PENDING  Incomplete  Resp Panel by RT-PCR (Flu A&B, Covid) Nasopharyngeal Swab     Status: Abnormal   Collection Time: 08/09/20  9:11 PM   Specimen: Nasopharyngeal Swab; Nasopharyngeal(NP) swabs in vial transport medium  Result Value Ref Range Status   SARS Coronavirus 2 by RT PCR POSITIVE (A) NEGATIVE Final    Comment: RESULT CALLED TO, READ BACK BY AND VERIFIED WITH: JANE MARTIN _0  ON 08/09/20 SKL (NOTE) SARS-CoV-2 target nucleic acids are DETECTED.  The SARS-CoV-2 RNA is generally detectable in upper respiratory specimens during the acute phase of infection. Positive results are indicative of the presence of  the identified virus, but do not rule out bacterial infection or co-infection with other pathogens not detected by the test. Clinical correlation with patient history and other diagnostic information is necessary to determine patient infection status. The expected result is Negative.  Fact Sheet  for Patients: EntrepreneurPulse.com.au  Fact Sheet for Healthcare Providers: IncredibleEmployment.be  This test is not yet approved or cleared by the [place] FDA and  has been authorized for detection and/or diagnosis of SARS-CoV-2 by FDA under an Emergency Use Authorization (EUA).  This EUA will remain in effect (meaning this test can be  used) for the duration of  the COVID-19 declaration under Section 564(b)(1) of the Act, 21 U.S.C. section 360bbb-3(b)(1), unless the authorization is terminated or revoked sooner.     Influenza A by PCR NEGATIVE NEGATIVE Final   Influenza B by PCR NEGATIVE NEGATIVE Final    Comment: (NOTE) The Xpert Xpress SARS-CoV-2/FLU/RSV plus assay is intended as an aid in the diagnosis of influenza from Nasopharyngeal swab specimens and should not be used as a sole basis for treatment. Nasal washings and aspirates are unacceptable for Xpert Xpress SARS-CoV-2/FLU/RSV testing.  Fact Sheet for Patients: EntrepreneurPulse.com.au  Fact Sheet for Healthcare Providers: IncredibleEmployment.be  This test is not yet approved or cleared by the [place] FDA and has been authorized for detection and/or diagnosis of SARS-CoV-2 by FDA under an Emergency Use Authorization (EUA). This EUA will remain in effect (meaning this test can be used) for the duration of the COVID-19 declaration under Section 564(b)(1) of the Act, 21 U.S.C. section 360bbb-3(b)(1), unless the authorization is terminated or revoked.  Performed at [facility], [address] [place], [place] [address]   Urine culture     Status: None   Collection Time: 08/10/20  4:33 AM   Specimen: In/Out Cath Urine  Result Value Ref Range Status   Specimen Description   Final    IN/OUT CATH URINE Performed at [facility], [address], [place], [place] [address]    Special Requests   Final     NONE Performed at [facility], [address], [place], [place] [address]    Culture   Final    NO GROWTH Performed at [facility], [place] [address], [place], [place] [address]    Report Status 08/11/2020 FINAL  Final  MRSA PCR Screening     Status: None   Collection Time: 08/10/20 12:40 PM   Specimen: Nasopharyngeal  Result Value Ref Range Status   MRSA by PCR NEGATIVE NEGATIVE Final    Comment:        The GeneXpert MRSA Assay (FDA approved for NASAL specimens only), is one component of a comprehensive MRSA colonization surveillance program. It is not intended to diagnose MRSA infection nor to guide or monitor treatment for MRSA infections. Performed at [facility], [address], [place], [place] [address]     Anti-infectives:  Anti-infectives (From admission, onward)   Start     Dose/Rate Route Frequency Ordered Stop   08/13/20 1400  remdesivir 100 mg in sodium chloride 0.9 % 100 mL IVPB        100 mg 200 mL/hr over 30 Minutes Intravenous  Once 08/13/20 1248 08/13/20 1731   08/11/20 0100  cefTRIAXone (ROCEPHIN) 2 g in sodium chloride 0.9 % 100 mL IVPB        2 g 200 mL/hr over 30 Minutes Intravenous Every 24 hours 08/10/20 0859     08/10/20 1000  remdesivir 100 mg in sodium chloride 0.9 % 100 mL IVPB       "Followed by" Linked Group Details   100 mg 200 mL/hr over 30 Minutes Intravenous Daily 08/09/20 2236 08/14/20 0959   08/10/20 0000  cefTRIAXone (ROCEPHIN) 2 g in sodium chloride 0.9 % 100 mL IVPB        2 g 200 mL/hr over 30 Minutes Intravenous  Once 08/09/20 2352 08/10/20 0125   08/09/20 2345  levofloxacin (LEVAQUIN) IVPB 750 mg  Status:  Discontinued        750 mg 100 mL/hr over 90 Minutes Intravenous  Once 08/09/20 2339 08/09/20 2351   08/09/20 2300  remdesivir 200 mg in sodium chloride 0.9% 250 mL IVPB       "Followed by" Linked Group Details   200 mg 580 mL/hr over 30 Minutes Intravenous Once 08/09/20 2236 08/10/20 0201    08/09/20 2300  azithromycin (ZITHROMAX) 500 mg in sodium chloride 0.9 % 250 mL IVPB        500 mg 250 mL/hr over 60 Minutes Intravenous Every 24 hours 08/10/20 0859 08/12/20 2259   08/09/20 2230  azithromycin (ZITHROMAX) 500 mg in sodium chloride 0.9 % 250 mL IVPB        500 mg 250 mL/hr over 60 Minutes Intravenous  Once 08/09/20 2227 08/10/20 0038        PAST MEDICAL HISTORY   Past Medical History:  Diagnosis Date  . Gout   . Hypertension   . Rupture of bowel (HCC)      SURGICAL HISTORY   Past Surgical History:  Procedure Laterality Date  . COLONOSCOPY WITH PROPOFOL N/A 05/18/2020   Procedure: COLONOSCOPY WITH PROPOFOL;  Surgeon: Sakai, Isami, DO;  Location: [facility] ENDOSCOPY;  Service: General;  Laterality: N/A;  . COLOSTOMY N/A 11/22/2019   Procedure: COLOSTOMY;  Surgeon: Cintron-Diaz, Edgardo, MD;  Location: [facility] ORS;  Service: General;  Laterality: N/A;  . LAPAROTOMY N/A 11/22/2019   Procedure: EXPLORATORY LAPAROTOMY;  Surgeon: Cintron-Diaz, Edgardo, MD;  Location: [facility] ORS;  Service: General;  Laterality: N/A;  . LAPAROTOMY N/A 07/08/2020   Procedure: EXPLORATORY LAPAROTOMY;  Surgeon: Cintron-Diaz, Edgardo, MD;  Location: [facility] ORS;  Service: General;  Laterality: N/A;  . PARTIAL COLECTOMY N/A 11/22/2019   Procedure: PARTIAL COLECTOMY;  Surgeon: Cintron-Diaz, Edgardo, MD;  Location: [facility] ORS;  Service: General;  Laterality: N/A;  . XI ROBOTIC ASSISTED COLOSTOMY TAKEDOWN N/A 06/28/2020   Procedure: XI ROBOTIC ASSISTED COLOSTOMY TAKEDOWN CONVERTED TO OPEN PROCEDURE;  Surgeon: Cintron-Diaz, Edgardo, MD;  Location: [facility] ORS;  Service: General;  Laterality: N/A;     FAMILY HISTORY   Family History  Problem Relation Age of Onset  . Healthy Mother   . Healthy Father      SOCIAL HISTORY   Social History   Tobacco Use  . Smoking status: Never Smoker  . Smokeless tobacco: Current User    Types: Chew  Vaping Use  . Vaping Use: Never used  Substance Use Topics  . Alcohol  use: Yes    Comment: pint to a fifth per day per patient . Patient reports he hasn't had anything to drink in three weeks.  . Drug use: Never     MEDICATIONS   Current Medication:  Current Facility-Administered Medications:  .  0.9 %  sodium chloride infusion, 250 mL, Intravenous, PRN, Opyd, Timothy S, MD, Stopped at 08/13/20 1132 .  acetaminophen (TYLENOL) tablet 650 mg, 650 mg, Per Tube, Q6H PRN, Amin, Sumayya, MD .  albuterol (VENTOLIN   HFA) 108 (90 Base) MCG/ACT inhaler 2 puff, 2 puff, Inhalation, Q6H PRN, Opyd, Timothy S, MD .  allopurinol (ZYLOPRIM) tablet 300 mg, 300 mg, Per Tube, Daily, Amin, Sumayya, MD, 300 mg at 08/14/20 0801 .  cefTRIAXone (ROCEPHIN) 2 g in sodium chloride 0.9 % 100 mL IVPB, 2 g, Intravenous, Q24H, Amin, Sumayya, MD, Stopped at 08/13/20 2210 .  chlorhexidine gluconate (MEDLINE KIT) (PERIDEX) 0.12 % solution 15 mL, 15 mL, Mouth Rinse, BID, , , MD, 15 mL at 08/14/20 0758 .  Chlorhexidine Gluconate Cloth 2 % PADS 6 each, 6 each, Topical, Daily, , , MD, 6 each at 08/13/20 1155 .  chlorpheniramine-HYDROcodone (TUSSIONEX) 10-8 MG/5ML suspension 5 mL, 5 mL, Per Tube, Q12H PRN, Amin, Sumayya, MD .  dexamethasone (DECADRON) injection 6 mg, 6 mg, Intravenous, Q24H, , , MD, 6 mg at 08/13/20 2003 .  dexmedetomidine (PRECEDEX) 400 MCG/100ML (4 mcg/mL) infusion, 0.4-1.2 mcg/kg/hr, Intravenous, Titrated, , , MD, Stopped at 08/13/20 1233 .  enoxaparin (LOVENOX) injection 60 mg, 0.5 mg/kg, Subcutaneous, Q2200, Opyd, Timothy S, MD, 60 mg at 08/13/20 2140 .  feeding supplement (PROSource TF) liquid 90 mL, 90 mL, Per Tube, 6 X Daily, , , MD, 90 mL at 08/14/20 1232 .  feeding supplement (VITAL HIGH PROTEIN) liquid 1,000 mL, 1,000 mL, Per Tube, Q24H, , , MD, 1,000 mL at 08/12/20 1426 .  fentaNYL 2500mcg in NS 250mL (10mcg/ml) infusion-PREMIX, 0-400 mcg/hr, Intravenous, Continuous, , , MD, Last  Rate: 25 mL/hr at 08/14/20 0800, 250 mcg/hr at 08/14/20 0800 .  guaiFENesin-dextromethorphan (ROBITUSSIN DM) 100-10 MG/5ML syrup 10 mL, 10 mL, Per Tube, Q4H PRN, Amin, Sumayya, MD .  HYDROcodone-acetaminophen (NORCO/VICODIN) 5-325 MG per tablet 1-2 tablet, 1-2 tablet, Per Tube, Q4H PRN, Amin, Sumayya, MD .  insulin aspart (novoLOG) injection 0-20 Units, 0-20 Units, Subcutaneous, Q4H, Keene, Jeremiah D, NP, 3 Units at 08/14/20 1236 .  MEDLINE mouth rinse, 15 mL, Mouth Rinse, 10 times per day, , , MD, 15 mL at 08/14/20 1240 .  multivitamin liquid 15 mL, 15 mL, Per Tube, Daily, , , MD, 15 mL at 08/14/20 0801 .  ondansetron (ZOFRAN) tablet 4 mg, 4 mg, Oral, Q6H PRN **OR** ondansetron (ZOFRAN) injection 4 mg, 4 mg, Intravenous, Q6H PRN, Opyd, Timothy S, MD .  pantoprazole sodium (PROTONIX) 40 mg/20 mL oral suspension 40 mg, 40 mg, Per Tube, Q2200, , , MD, 40 mg at 08/13/20 2145 .  propofol (DIPRIVAN) 1000 MG/100ML infusion, 5-80 mcg/kg/min, Intravenous, Titrated, , , MD, Last Rate: 46 mL/hr at 08/14/20 1109, 65 mcg/kg/min at 08/14/20 1109 .  sennosides (SENOKOT) 8.8 MG/5ML syrup 10 mL, 10 mL, Per Tube, BID, Grubb, Rodney D, RPH, 10 mL at 08/13/20 2139 .  sodium chloride flush (NS) 0.9 % injection 10-40 mL, 10-40 mL, Intracatheter, Q12H, Amin, Sumayya, MD, 10 mL at 08/14/20 0926 .  sodium chloride flush (NS) 0.9 % injection 10-40 mL, 10-40 mL, Intracatheter, PRN, Amin, Sumayya, MD .  thiamine tablet 100 mg, 100 mg, Per Tube, Daily, , , MD, 100 mg at 08/14/20 0801 .  vecuronium (NORCURON) injection 10 mg, 10 mg, Intravenous, Q1H PRN, , , MD, 10 mg at 08/13/20 1237    ALLERGIES   Amoxicillin    REVIEW OF SYSTEMS     Unable to obtain due to sedation on precedex drip  PHYSICAL EXAMINATION   Vital Signs: Temp:  [94.6 F (34.8 C)-99.5 F (37.5 C)] 99.14 F (37.3 C) (12/26 1100) Pulse Rate:  [70-101] 74 (12/26  1100) Resp:  [  12-21] 19 (12/26 1100) BP: (113-155)/(65-76) 141/72 (12/26 1100) SpO2:  [92 %-100 %] 98 % (12/26 1100) FiO2 (%):  [55 %-100 %] 55 % (12/26 0735)  GENERAL:age appropriate sedated HEAD: Normocephalic, atraumatic.  EYES: Pupils equal, round, reactive to light.  No scleral icterus. ETT+ MOUTH: dryt mucosal membrane. NECK: Supple. No thyromegaly. No nodules. No JVD.  PULMONARY: ronchi bilaterally  CARDIOVASCULAR: S1 and S2. Regular rate and rhythm. No murmurs, rubs, or gallops.  GASTROINTESTINAL: Soft, nontender, non-distended. No masses. Positive bowel sounds. No hepatosplenomegaly.  MUSCULOSKELETAL: No swelling, clubbing, or edema.  NEUROLOGIC: GCS4T SKIN:intact,warm,dry   PERTINENT DATA     Infusions: . sodium chloride Stopped (08/13/20 1132)  . cefTRIAXone (ROCEPHIN)  IV Stopped (08/13/20 2210)  . dexmedetomidine (PRECEDEX) IV infusion Stopped (08/13/20 1233)  . fentaNYL infusion INTRAVENOUS 250 mcg/hr (08/14/20 0800)  . propofol (DIPRIVAN) infusion 65 mcg/kg/min (08/14/20 1109)   Scheduled Medications: . allopurinol  300 mg Per Tube Daily  . chlorhexidine gluconate (MEDLINE KIT)  15 mL Mouth Rinse BID  . Chlorhexidine Gluconate Cloth  6 each Topical Daily  . dexamethasone (DECADRON) injection  6 mg Intravenous Q24H  . enoxaparin (LOVENOX) injection  0.5 mg/kg Subcutaneous Q2200  . feeding supplement (PROSource TF)  90 mL Per Tube 6 X Daily  . feeding supplement (VITAL HIGH PROTEIN)  1,000 mL Per Tube Q24H  . insulin aspart  0-20 Units Subcutaneous Q4H  . mouth rinse  15 mL Mouth Rinse 10 times per day  . multivitamin  15 mL Per Tube Daily  . pantoprazole sodium  40 mg Per Tube Q2200  . sennosides  10 mL Per Tube BID  . sodium chloride flush  10-40 mL Intracatheter Q12H  . thiamine  100 mg Per Tube Daily   PRN Medications: sodium chloride, acetaminophen, albuterol, chlorpheniramine-HYDROcodone, guaiFENesin-dextromethorphan, HYDROcodone-acetaminophen,  ondansetron **OR** ondansetron (ZOFRAN) IV, sodium chloride flush, vecuronium Hemodynamic parameters:   Intake/Output: 12/25 0701 - 12/26 0700 In: 1415.4 [I.V.:1315.4; IV Piggyback:100] Out: 1000 [Urine:1000]  Ventilator  Settings: Vent Mode: PRVC FiO2 (%):  [55 %-100 %] 55 % Set Rate:  [20 bmp] 20 bmp Vt Set:  [500 mL] 500 mL PEEP:  [14 cmH20] 14 cmH20 Plateau Pressure:  [20 cmH20-25 cmH20] 20 cmH20    LAB RESULTS:  Basic Metabolic Panel: Recent Labs  Lab 08/10/20 0433 08/11/20 0636 08/12/20 0409 08/13/20 0314 08/14/20 0539  NA 139 140 145 145 145  K 5.8* 4.4 5.3* 4.3 4.4  CL 100 102 107 108 107  CO2 _0 GLUCOSE 287* 194* 179* 200* 189*  BUN 9 15 25* 37* 25*  CREATININE 0.69 0.50* 0.75 0.60* 0.51*  CALCIUM 8.9 8.9 8.6* 8.6* 8.6*  MG 2.1  --   --  2.6* 2.4  PHOS 2.6  --   --  3.3 2.7   Liver Function Tests: Recent Labs  Lab 08/10/20 0433 08/11/20 0636 08/12/20 0409 08/13/20 0314 08/14/20 0539  AST 41 _1 ALT _2 ALKPHOS 49 46 40 37* 34*  BILITOT 0.5 0.7 0.5 0.6 0.7  PROT 6.9 6.8 6.6 6.3* 6.1*  ALBUMIN 2.8* 2.9* 2.8* 2.7* 2.8*   No results for input(s): LIPASE, AMYLASE in the last 168 hours. No results for input(s): AMMONIA in the last 168 hours. CBC: Recent Labs  Lab 08/10/20 0433 08/11/20 0636 08/12/20 0409 08/13/20 0314 08/14/20 0539  WBC 5.8 7.6 6.2 5.8 7.4  NEUTROABS 5.1 6.7 5.4 5.0 6.8  HGB  12.1* 11.8* 10.4* 9.8* 9.8*  HCT 36.9* 36.9* 34.3* 32.8* 31.7*  MCV 92.5 91.6 96.1 97.3 96.6  PLT 112* 133* 151 145* 152   Cardiac Enzymes: No results for input(s): CKTOTAL, CKMB, CKMBINDEX, TROPONINI in the last 168 hours. BNP: Invalid input(s): POCBNP CBG: Recent Labs  Lab 08/13/20 1944 08/13/20 2338 08/14/20 0347 08/14/20 0726 08/14/20 1139  GLUCAP 113* 166* 178* 158* 144*       IMAGING RESULTS:  Imaging: DG Abd 1 View  Result Date: 08/14/2020 CLINICAL DATA:  COVID.  Intubation EXAM: ABDOMEN - 1  VIEW COMPARISON:  Two days ago FINDINGS: Enteric tube with tip and side-port over the upper stomach. Endotracheal tube tip in the trachea. Bilateral airspace disease. Cardiomegaly and vascular pedicle widening. IMPRESSION: Enteric tube in good position. Electronically Signed   By: Jonathon  Watts M.D.   On: 08/14/2020 07:37   DG Chest Port 1 View  Result Date: 08/14/2020 CLINICAL DATA:  Acute respiratory failure. EXAM: PORTABLE CHEST 1 VIEW COMPARISON:  08/13/2020 FINDINGS: 0400 hours. Endotracheal tube tip is 4.4 cm above the base of the carina. The NG tube tip is in the stomach. Esophageal temperature probe evident. Diffuse patchy bilateral airspace disease is similar to prior. No substantial pleural effusion. Telemetry leads overlie the chest. IMPRESSION: 1. Stable exam. Patchy bilateral airspace disease. 2. Support apparatus as described. Electronically Signed   By: Eric  Mansell M.D.   On: 08/14/2020 04:54   DG Chest Port 1 View  Result Date: 08/13/2020 CLINICAL DATA:  COVID positive, new intubation EXAM: PORTABLE CHEST 1 VIEW COMPARISON:  08/11/2020 FINDINGS: Interval repositioning of endotracheal tube, tip projecting over the mid trachea in appropriate position. Esophagogastric tube with tip and side port below the diaphragm, tip positioned in the gastric fundus. No significant interval change in heterogeneous bilateral airspace disease. Mild cardiomegaly. IMPRESSION: 1. Interval repositioning of endotracheal tube, tip projecting over the mid trachea in appropriate position. 2. Esophagogastric tube with tip and side port below the diaphragm, tip positioned in the gastric fundus. 3. Unchanged heterogeneous bilateral airspace disease, consistent COVID infection. Electronically Signed   By: Alex  Bibbey M.D.   On: 08/13/2020 12:14   @[email or web address]@ DG Chest Port 1 View  Result Date: 08/14/2020 CLINICAL DATA:  Acute respiratory failure. EXAM: PORTABLE CHEST 1 VIEW COMPARISON:  08/13/2020 FINDINGS: 0400  hours. Endotracheal tube tip is 4.4 cm above the base of the carina. The NG tube tip is in the stomach. Esophageal temperature probe evident. Diffuse patchy bilateral airspace disease is similar to prior. No substantial pleural effusion. Telemetry leads overlie the chest. IMPRESSION: 1. Stable exam. Patchy bilateral airspace disease. 2. Support apparatus as described. Electronically Signed   By: Eric  Mansell M.D.   On: 08/14/2020 04:54     ASSESSMENT AND PLAN    -Multidisciplinary rounds held today  Acute Hypoxic Respiratory Failure Acute COVID19 pneumonia with severe ARDS -Remdesevir antiviral - pharmacy protocol 5 d -vitamin C -zinc -decadron 6mg IV daily  -Diuresis - Lasix 40 IV daily - monitor UOP - utilize external urinary catheter if possible- -Self prone if patient can tolerate  -d/c hepatotoxic medications while on remdesevir -baricitinib per covid protocol -supportive care with ICU telemetry monitoring -PT/OT when possible -procalcitonin, CRP and ferritin trending -baricitinib per protocol      Perforated diverticulitis   - s/p colostomy     - may present problem with proning protocol for COVID    - reviewed care plan with surgeon today - Dr Cintron-Diaz - appreciate input   ICU telemetry  Monitoring   ID -continue IV abx as prescibed -follow up cultures  GI/Nutrition GI PROPHYLAXIS as indicated DIET-->TF's as tolerated Constipation protocol as indicated  ENDO - ICU hypoglycemic\Hyperglycemia protocol -check FSBS per protocol   ELECTROLYTES -follow labs as needed -replace as needed -pharmacy consultation   DVT/GI PRX ordered -SCDs  TRANSFUSIONS AS NEEDED MONITOR FSBS ASSESS the need for LABS as needed   Critical care provider statement:    Critical care time (minutes):  33   Critical care time was exclusive of:  Separately billable procedures and treating other patients   Critical care was necessary to treat or prevent imminent or  life-threatening deterioration of the following conditions:  severe acute hypoxemic respiratory failure, severe ARDS, perforated diverticulitis.    Critical care was time spent personally by me on the following activities:  Development of treatment plan with patient or surrogate, discussions with consultants, evaluation of patient's response to treatment, examination of patient, obtaining history from patient or surrogate, ordering and performing treatments and interventions, ordering and review of laboratory studies and re-evaluation of patient's condition.  I assumed direction of critical care for this patient from another provider in my specialty: no    This document was prepared using Dragon voice recognition software and may include unintentional dictation errors.    Ottie Glazier, M.D.  Division of [facility]

## 2020-08-15 ENCOUNTER — Inpatient Hospital Stay: Payer: No Typology Code available for payment source

## 2020-08-15 DIAGNOSIS — U071 COVID-19: Secondary | ICD-10-CM | POA: Diagnosis not present

## 2020-08-15 DIAGNOSIS — J9601 Acute respiratory failure with hypoxia: Secondary | ICD-10-CM | POA: Diagnosis not present

## 2020-08-15 LAB — CBC
HCT: 30.5 % — ABNORMAL LOW (ref 39.0–52.0)
Hemoglobin: 9 g/dL — ABNORMAL LOW (ref 13.0–17.0)
MCH: 28.8 pg (ref 26.0–34.0)
MCHC: 29.5 g/dL — ABNORMAL LOW (ref 30.0–36.0)
MCV: 97.8 fL (ref 80.0–100.0)
Platelets: 147 10*3/uL — ABNORMAL LOW (ref 150–400)
RBC: 3.12 MIL/uL — ABNORMAL LOW (ref 4.22–5.81)
RDW: 14.9 % (ref 11.5–15.5)
WBC: 7.9 10*3/uL (ref 4.0–10.5)
nRBC: 0 % (ref 0.0–0.2)

## 2020-08-15 LAB — GLUCOSE, CAPILLARY
Glucose-Capillary: 111 mg/dL — ABNORMAL HIGH (ref 70–99)
Glucose-Capillary: 117 mg/dL — ABNORMAL HIGH (ref 70–99)
Glucose-Capillary: 133 mg/dL — ABNORMAL HIGH (ref 70–99)
Glucose-Capillary: 154 mg/dL — ABNORMAL HIGH (ref 70–99)
Glucose-Capillary: 188 mg/dL — ABNORMAL HIGH (ref 70–99)
Glucose-Capillary: 193 mg/dL — ABNORMAL HIGH (ref 70–99)
Glucose-Capillary: 225 mg/dL — ABNORMAL HIGH (ref 70–99)

## 2020-08-15 LAB — BLOOD GAS, ARTERIAL
Acid-Base Excess: 4.9 mmol/L — ABNORMAL HIGH (ref 0.0–2.0)
Allens test (pass/fail): POSITIVE — AB
Bicarbonate: 32.2 mmol/L — ABNORMAL HIGH (ref 20.0–28.0)
FIO2: 90
MECHVT: 500 mL
O2 Saturation: 96.6 %
PEEP: 14 cmH2O
Patient temperature: 37
RATE: 24 resp/min
pCO2 arterial: 57 mmHg — ABNORMAL HIGH (ref 32.0–48.0)
pH, Arterial: 7.36 (ref 7.350–7.450)
pO2, Arterial: 90 mmHg (ref 83.0–108.0)

## 2020-08-15 LAB — CULTURE, BLOOD (ROUTINE X 2)
Culture: NO GROWTH
Culture: NO GROWTH
Special Requests: ADEQUATE
Special Requests: ADEQUATE

## 2020-08-15 LAB — PHOSPHORUS: Phosphorus: 3.2 mg/dL (ref 2.5–4.6)

## 2020-08-15 LAB — BASIC METABOLIC PANEL
Anion gap: 5 (ref 5–15)
BUN: 23 mg/dL — ABNORMAL HIGH (ref 6–20)
CO2: 31 mmol/L (ref 22–32)
Calcium: 8.3 mg/dL — ABNORMAL LOW (ref 8.9–10.3)
Chloride: 107 mmol/L (ref 98–111)
Creatinine, Ser: 0.61 mg/dL (ref 0.61–1.24)
GFR, Estimated: >60 mL/min (ref >60)
Glucose, Bld: 207 mg/dL — ABNORMAL HIGH (ref 70–99)
Potassium: 4.1 mmol/L (ref 3.5–5.1)
Sodium: 143 mmol/L (ref 135–145)

## 2020-08-15 LAB — MAGNESIUM: Magnesium: 2.1 mg/dL (ref 1.7–2.4)

## 2020-08-15 IMAGING — DX DG CHEST 1V PORT
1 series · 2 of 2 positions shown · non-contrast
Comparison: Portable chest [DATE] and earlier.

CLINICAL DATA: 29-year-old male with respiratory failure. [E1].

EXAM:
PORTABLE CHEST 1 VIEW

[Series 1: chest ap · 0.14mm/px · 2 of 2 slices shown]
[im 1/2]
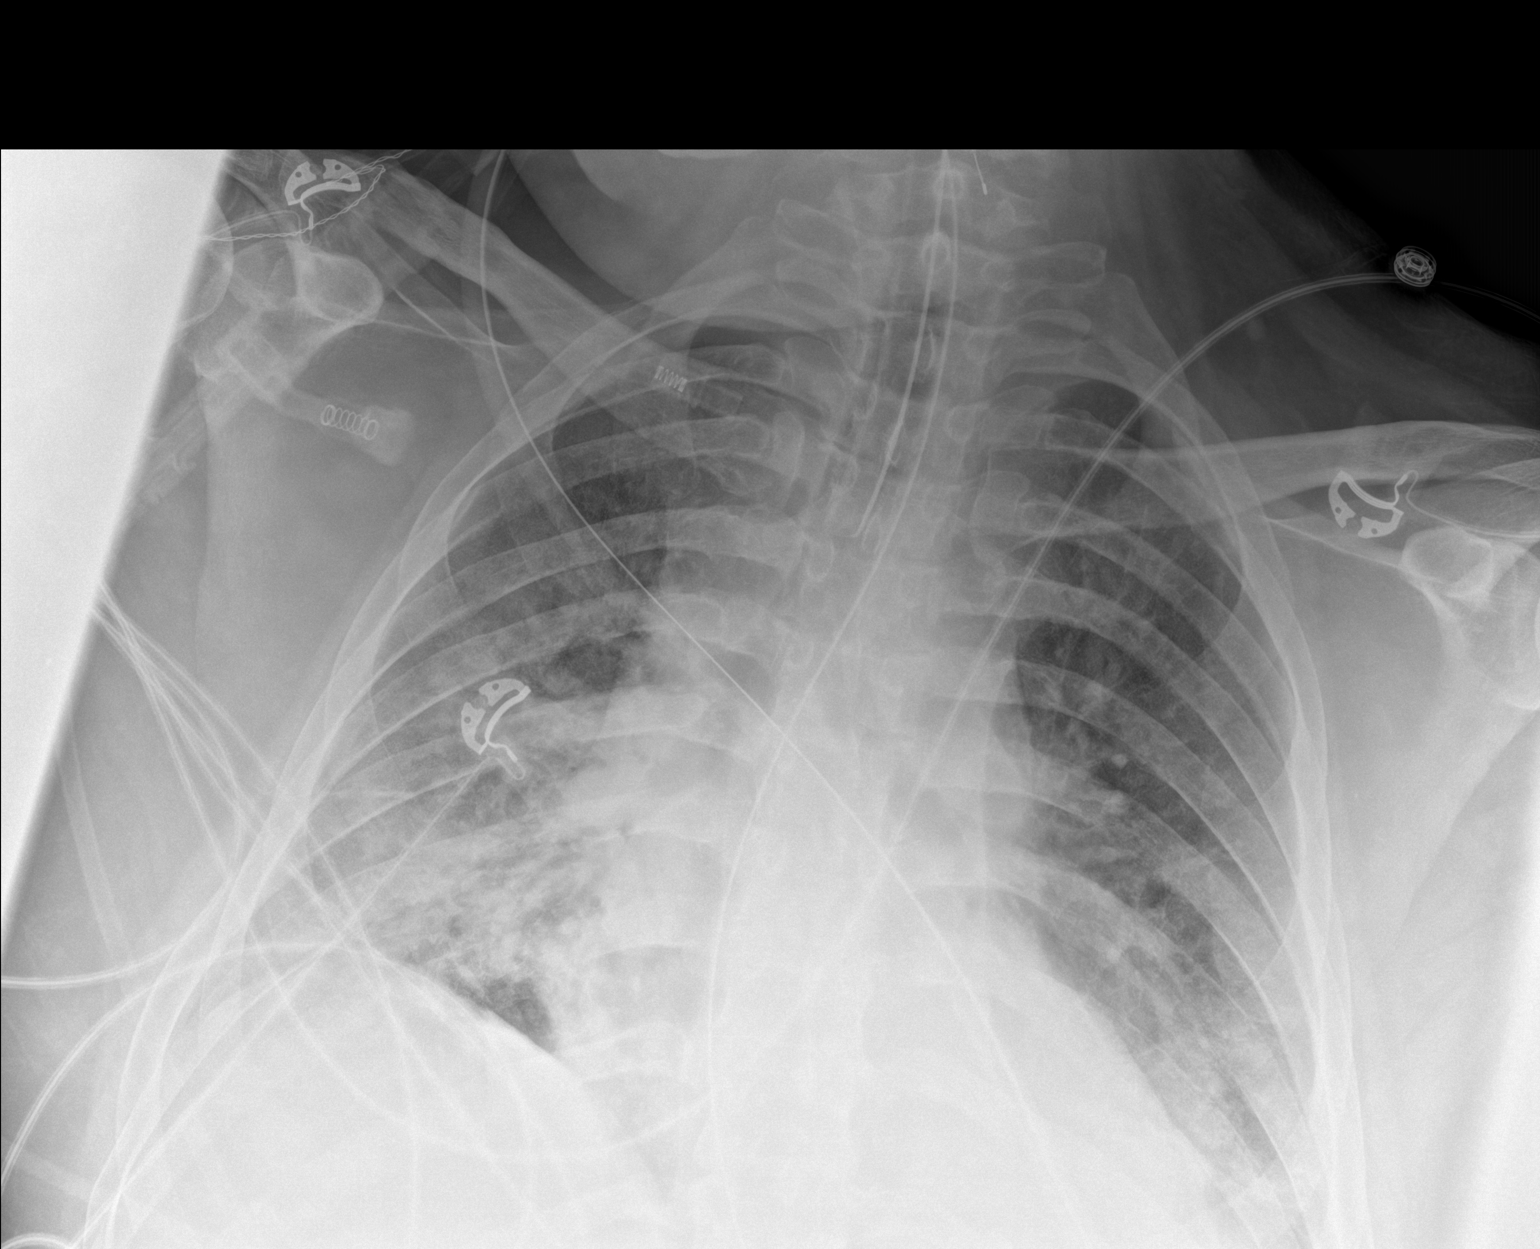
[im 2/2]
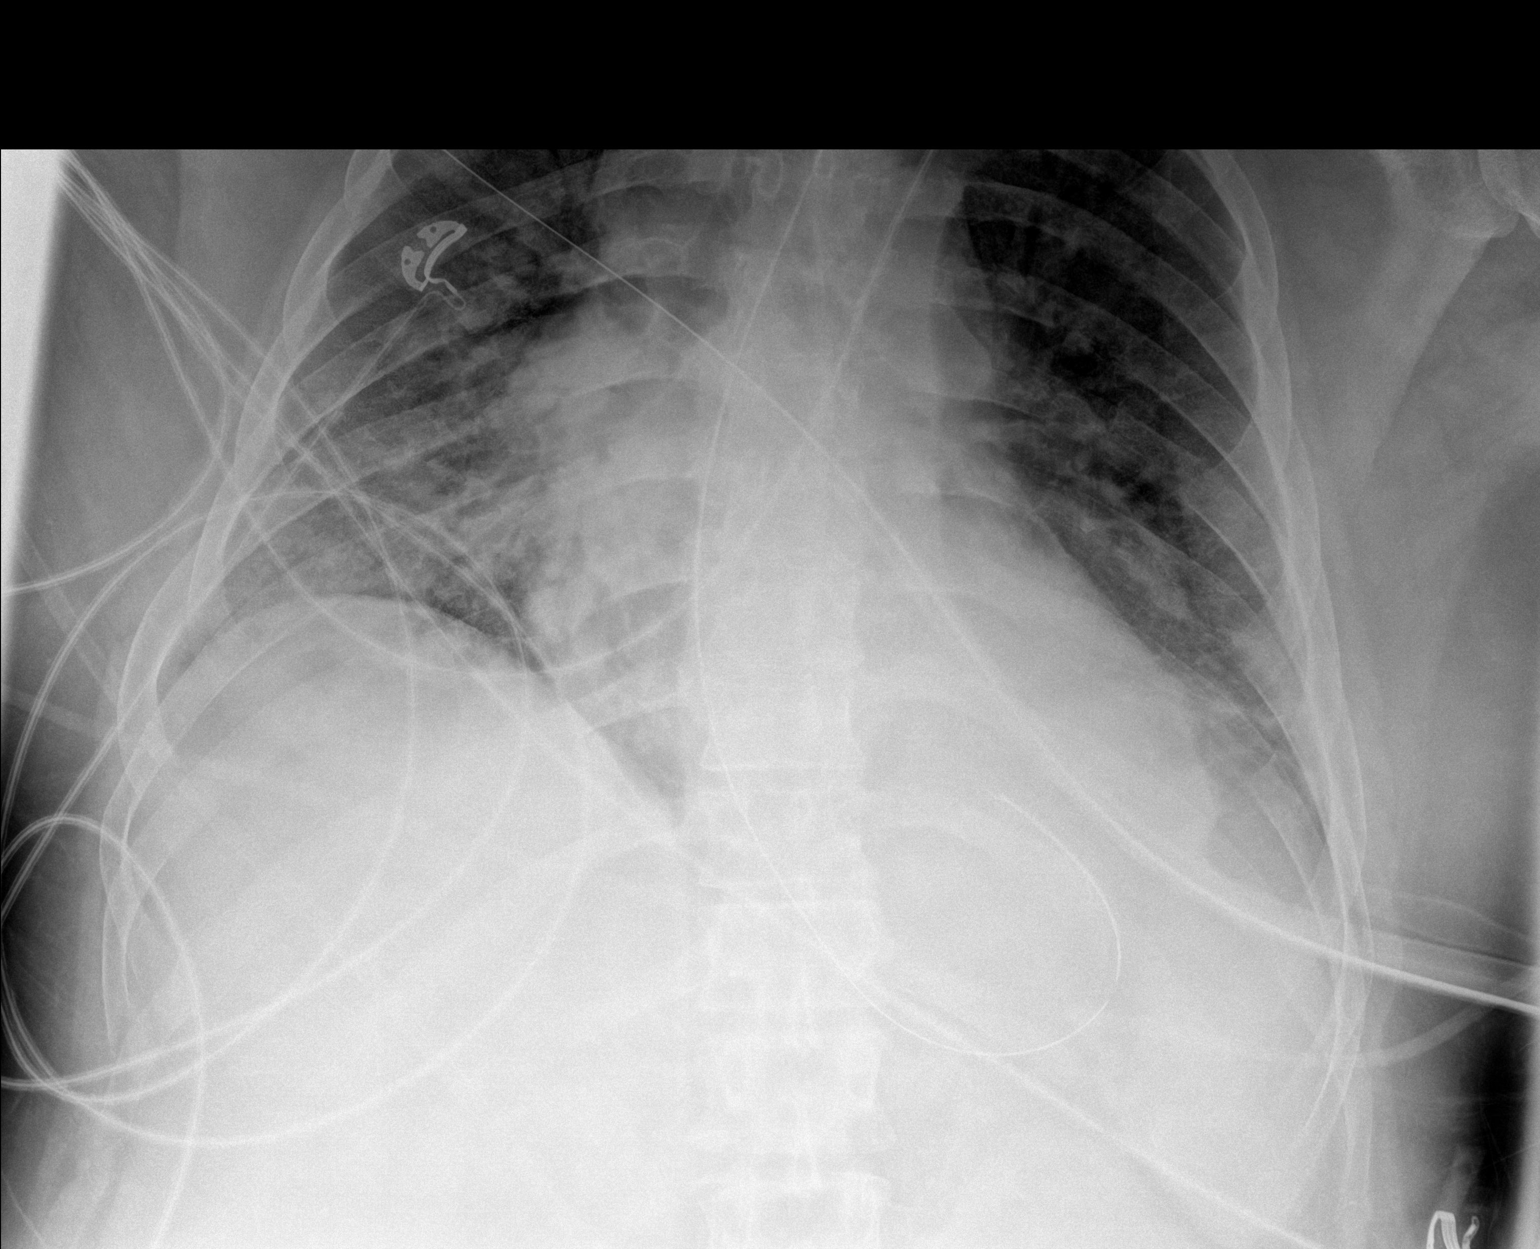

[2 of 2 positions shown; findings below may reference images not displayed]

FINDINGS: Portable AP semi upright view at stable endotracheal tube tip just
below the clavicles. Enteric tube courses to the abdomen, stable
enteric tube looped in the stomach.

Stable lung volumes. Mediastinal contours remain within normal
limits. No pneumothorax. No pleural effusion is evident.

Increased retrocardiac opacity on the left. Otherwise widespread
patchy bilateral pulmonary opacity appears stable and is most
confluent at the lung bases.

No acute osseous abnormality identified. Paucity of bowel gas in the
upper abdomen.
IMPRESSION: 1.  Stable lines and tubes.
2. Left lower lobe collapse suspected since yesterday. Otherwise
stable [E1] pneumonia.

## 2020-08-15 MED ORDER — PIPERACILLIN-TAZOBACTAM 3.375 G IVPB
3.3750 g | Freq: Three times a day (TID) | INTRAVENOUS | Status: AC
  Administered 2020-08-15 – 2020-08-19 (×14): 3.375 g via INTRAVENOUS
  Filled 2020-08-15 (×14): qty 50

## 2020-08-15 MED ORDER — FUROSEMIDE 10 MG/ML IJ SOLN
40.0000 mg | Freq: Three times a day (TID) | INTRAMUSCULAR | Status: AC
  Administered 2020-08-15 (×2): 40 mg via INTRAVENOUS
  Filled 2020-08-15 (×2): qty 4

## 2020-08-15 MED ORDER — VITAL 1.5 CAL PO LIQD
1000.0000 mL | ORAL | Status: DC
  Administered 2020-08-15 – 2020-08-18 (×4): 1000 mL

## 2020-08-15 MED ORDER — VANCOMYCIN HCL 2000 MG/400ML IV SOLN
2000.0000 mg | Freq: Once | INTRAVENOUS | Status: AC
  Administered 2020-08-15: 2000 mg via INTRAVENOUS
  Filled 2020-08-15: qty 400

## 2020-08-15 MED ORDER — PROSOURCE TF PO LIQD
90.0000 mL | Freq: Four times a day (QID) | ORAL | Status: DC
  Administered 2020-08-15 – 2020-08-19 (×14): 90 mL
  Filled 2020-08-15 (×18): qty 90

## 2020-08-15 MED ORDER — FENTANYL BOLUS VIA INFUSION
50.0000 ug | INTRAVENOUS | Status: DC | PRN
  Administered 2020-08-15 – 2020-08-16 (×2): 50 ug via INTRAVENOUS
  Administered 2020-08-22 – 2020-08-24 (×6): 100 ug via INTRAVENOUS
  Filled 2020-08-15: qty 100

## 2020-08-15 MED ORDER — FENTANYL NICU BOLUS VIA INFUSION
50.0000 ug | INTRAVENOUS | Status: DC | PRN
Start: 2020-08-15 — End: 2020-08-15

## 2020-08-15 MED ORDER — ACETAMINOPHEN 325 MG PO TABS
650.0000 mg | ORAL_TABLET | ORAL | Status: DC | PRN
  Administered 2020-08-15 – 2020-08-19 (×10): 650 mg
  Filled 2020-08-15 (×10): qty 2

## 2020-08-15 MED ORDER — IBUPROFEN 100 MG/5ML PO SUSP
800.0000 mg | Freq: Once | ORAL | Status: AC
  Administered 2020-08-15: 800 mg
  Filled 2020-08-15: qty 40

## 2020-08-15 NOTE — Progress Notes (Addendum)
CRITICAL CARE PROGRESS NOTE    Name: Craig [name] MRN: [id number] DOB: June 11, 1991  Referring physician: Dr Reesa Chew    LOS: [place] EVENTS    Patient description:  29 yo male recent hospitalization for perforated diverticulitis s/p colostomy with reversal and leakage of anastomosis with s/p revision now acutely hypoxemic with COVID19 induced severe ARDS.    08/11/20-patient continued to decline with worsening respiratory status despite 100%Fio2 on BIPAP and HFNC failure.  He stated that he feels he is dying and cannot breathe.  I discussed with wife [name] earlier today that patient is critically ill may need ETT, she is agreeable and thankful for care. I specifically discussed and explained intubation and mechanical ventilation to patient and he wishes to proceed.    08/12/20- patient was weaned on FiO2 to 60%. Spoke to wife [name].   08/13/20- Patient weaned to 40%.  During SBT today he self extubated and was placed on HFNC.  He subsequently became hypoxemic, disoriented aggitated, removed PIVs and colostomy and proceeded to have combative behavior with worsening oxygenation. I have attempted to calm patient and encouraged him to cooperate.  After numerous attempts patient continued to be combative with severe aggitation with combative behavior and would not wear oxygen. Patient was placed on sedation and MV.  Wife updated.    08/14/20- patient remains critically ill.  Weaned from 100%>>55%. Called and updated wife [name] today  12/27-Patient with worsening oxygenation on FiO2 100% peep of 14  Lines/tubes : Closed System Drain 1 Right Abdomen Bulb (JP) 19 Fr. (Active)     Colostomy LLQ (Active)  Stoma Assessment Pink;Red 08/10/20 0944  Peristomal Assessment Intact  08/10/20 0944    Microbiology/Sepsis markers: Results for orders placed or performed during the hospital encounter of 08/09/20  Culture, blood (Routine X 2) w Reflex to ID Panel     Status: None   Collection Time: 08/09/20  9:06 PM   Specimen: BLOOD  Result Value Ref Range Status   Specimen Description BLOOD RIGHT ARM  Final   Special Requests   Final    BOTTLES DRAWN AEROBIC AND ANAEROBIC Blood Culture adequate volume   Culture   Final    NO GROWTH 5 DAYS Performed at [facility], [address], [place], [place] [address]    Report Status 08/15/2020 FINAL  Final  Culture, blood (Routine X 2) w Reflex to ID Panel     Status: None   Collection Time: 08/09/20  9:06 PM   Specimen: BLOOD  Result Value Ref Range Status   Specimen Description BLOOD RIGHT [facility]  Final   Special Requests   Final    BOTTLES DRAWN AEROBIC AND ANAEROBIC Blood Culture adequate volume   Culture   Final    NO GROWTH 5 DAYS Performed at [facility], [address] [place], [place] [address]    Report Status 08/15/2020 FINAL  Final  Resp Panel by RT-PCR (Flu A&B, Covid) Nasopharyngeal Swab     Status: Abnormal   Collection Time: 08/09/20  9:11 PM   Specimen: Nasopharyngeal Swab; Nasopharyngeal(NP) swabs in vial transport medium  Result Value Ref Range Status   SARS Coronavirus 2 by RT PCR POSITIVE (A) NEGATIVE Final    Comment: RESULT CALLED TO, READ BACK BY AND VERIFIED WITH: JANE MARTIN '@[id number]'  ON 08/09/20 SKL (NOTE) SARS-CoV-2 target nucleic acids are DETECTED.  The SARS-CoV-2 RNA is generally detectable in upper respiratory specimens during the acute phase of  infection. Positive results are indicative of the presence of the identified virus, but do not rule out bacterial infection or co-infection with other pathogens not detected by the test. Clinical correlation with patient history and other diagnostic information is necessary to determine patient infection status. The expected  result is Negative.  Fact Sheet for Patients: EntrepreneurPulse.com.au  Fact Sheet for Healthcare Providers: IncredibleEmployment.be  This test is not yet approved or cleared by the [place] FDA and  has been authorized for detection and/or diagnosis of SARS-CoV-2 by FDA under an Emergency Use Authorization (EUA).  This EUA will remain in effect (meaning this test can be  used) for the duration of  the COVID-19 declaration under Section 564(b)(1) of the Act, 21 U.S.C. section 360bbb-3(b)(1), unless the authorization is terminated or revoked sooner.     Influenza A by PCR NEGATIVE NEGATIVE Final   Influenza B by PCR NEGATIVE NEGATIVE Final    Comment: (NOTE) The Xpert Xpress SARS-CoV-2/FLU/RSV plus assay is intended as an aid in the diagnosis of influenza from Nasopharyngeal swab specimens and should not be used as a sole basis for treatment. Nasal washings and aspirates are unacceptable for Xpert Xpress SARS-CoV-2/FLU/RSV testing.  Fact Sheet for Patients: EntrepreneurPulse.com.au  Fact Sheet for Healthcare Providers: IncredibleEmployment.be  This test is not yet approved or cleared by the [place] FDA and has been authorized for detection and/or diagnosis of SARS-CoV-2 by FDA under an Emergency Use Authorization (EUA). This EUA will remain in effect (meaning this test can be used) for the duration of the COVID-19 declaration under Section 564(b)(1) of the Act, 21 U.S.C. section 360bbb-3(b)(1), unless the authorization is terminated or revoked.  Performed at [facility], [address], [place], [place] [address]   Urine culture     Status: None   Collection Time: 08/10/20  4:33 AM   Specimen: In/Out Cath Urine  Result Value Ref Range Status   Specimen Description   Final    IN/OUT CATH URINE Performed at [facility], [address], [place], [place] [address]     Special Requests   Final    NONE Performed at [facility], [address], [place], [place] [address]    Culture   Final    NO GROWTH Performed at [facility], [place] [address] [place], [place] [address]    Report Status 08/11/2020 FINAL  Final  MRSA PCR Screening     Status: None   Collection Time: 08/10/20 12:40 PM   Specimen: Nasopharyngeal  Result Value Ref Range Status   MRSA by PCR NEGATIVE NEGATIVE Final    Comment:        The GeneXpert MRSA Assay (FDA approved for NASAL specimens only), is one component of a comprehensive MRSA colonization surveillance program. It is not intended to diagnose MRSA infection nor to guide or monitor treatment for MRSA infections. Performed at [facility], [address], [place], [place] [address]     Anti-infectives:  Anti-infectives (From admission, onward)   Start     Dose/Rate Route Frequency Ordered Stop   08/13/20 1400  remdesivir 100 mg in sodium chloride 0.9 % 100 mL IVPB        100 mg 200 mL/hr over 30 Minutes Intravenous  Once 08/13/20 1248 08/13/20 1731   08/11/20 0100  cefTRIAXone (ROCEPHIN) 2 g in sodium chloride 0.9 % 100 mL IVPB        2 g 200 mL/hr over 30 Minutes Intravenous Every 24  hours 08/10/20 0859     08/10/20 1000  remdesivir 100 mg in sodium chloride 0.9 % 100 mL IVPB       "Followed by" Linked Group Details   100 mg 200 mL/hr over 30 Minutes Intravenous Daily 08/09/20 2236 08/14/20 0959   08/10/20 0000  cefTRIAXone (ROCEPHIN) 2 g in sodium chloride 0.9 % 100 mL IVPB        2 g 200 mL/hr over 30 Minutes Intravenous  Once 08/09/20 2352 08/10/20 0125   08/09/20 2345  levofloxacin (LEVAQUIN) IVPB 750 mg  Status:  Discontinued        750 mg 100 mL/hr over 90 Minutes Intravenous  Once 08/09/20 2339 08/09/20 2351   08/09/20 2300  remdesivir 200 mg in sodium chloride 0.9% 250 mL IVPB       "Followed by" Linked Group Details   200 mg 580 mL/hr over 30 Minutes Intravenous Once 08/09/20  2236 08/10/20 0201   08/09/20 2300  azithromycin (ZITHROMAX) 500 mg in sodium chloride 0.9 % 250 mL IVPB        500 mg 250 mL/hr over 60 Minutes Intravenous Every 24 hours 08/10/20 0859 08/12/20 2259   08/09/20 2230  azithromycin (ZITHROMAX) 500 mg in sodium chloride 0.9 % 250 mL IVPB        500 mg 250 mL/hr over 60 Minutes Intravenous  Once 08/09/20 2227 08/10/20 0038        PAST MEDICAL HISTORY   Past Medical History:  Diagnosis Date  . Gout   . Hypertension   . Rupture of bowel [facility])      SURGICAL HISTORY   Past Surgical History:  Procedure Laterality Date  . COLONOSCOPY WITH PROPOFOL N/A 05/18/2020   Procedure: COLONOSCOPY WITH PROPOFOL;  Surgeon: Benjamine Sprague, DO;  Location: [facility] ENDOSCOPY;  Service: General;  Laterality: N/A;  . COLOSTOMY N/A 11/22/2019   Procedure: COLOSTOMY;  Surgeon: Herbert Pun, MD;  Location: [facility] ORS;  Service: General;  Laterality: N/A;  . LAPAROTOMY N/A 11/22/2019   Procedure: EXPLORATORY LAPAROTOMY;  Surgeon: Herbert Pun, MD;  Location: [facility] ORS;  Service: General;  Laterality: N/A;  . LAPAROTOMY N/A 07/08/2020   Procedure: EXPLORATORY LAPAROTOMY;  Surgeon: Herbert Pun, MD;  Location: [facility] ORS;  Service: General;  Laterality: N/A;  . PARTIAL COLECTOMY N/A 11/22/2019   Procedure: PARTIAL COLECTOMY;  Surgeon: Herbert Pun, MD;  Location: [facility] ORS;  Service: General;  Laterality: N/A;  . XI ROBOTIC ASSISTED COLOSTOMY TAKEDOWN N/A 06/28/2020   Procedure: XI ROBOTIC ASSISTED COLOSTOMY TAKEDOWN CONVERTED TO OPEN PROCEDURE;  Surgeon: Herbert Pun, MD;  Location: [facility] ORS;  Service: General;  Laterality: N/A;     FAMILY HISTORY   Family History  Problem Relation Age of Onset  . Healthy Mother   . Healthy Father      SOCIAL HISTORY   Social History   Tobacco Use  . Smoking status: Never Smoker  . Smokeless tobacco: Current User    Types: Chew  Vaping Use  . Vaping Use: Never used  Substance Use  Topics  . Alcohol use: Yes    Comment: pint to a fifth per day per patient . Patient reports he hasn't had anything to drink in three weeks.  . Drug use: Never     MEDICATIONS   Current Medication:  Current Facility-Administered Medications:  .  0.9 %  sodium chloride infusion, 250 mL, Intravenous, PRN, Opyd, Ilene Qua, MD, Stopped at 08/13/20 1132 .  acetaminophen (TYLENOL) tablet 650 mg, 650 mg, Per  Tube, Q4H PRN, Rust-Chester, Britton L, NP, 650 mg at 08/15/20 0352 .  albuterol (VENTOLIN HFA) 108 (90 Base) MCG/ACT inhaler 2 puff, 2 puff, Inhalation, Q6H PRN, Opyd, Ilene Qua, MD .  allopurinol (ZYLOPRIM) tablet 300 mg, 300 mg, Per Tube, Daily, Lorella Nimrod, MD, 300 mg at 08/14/20 0801 .  cefTRIAXone (ROCEPHIN) 2 g in sodium chloride 0.9 % 100 mL IVPB, 2 g, Intravenous, Q24H, Amin, Soundra Pilon, MD, Last Rate: 200 mL/hr at 08/14/20 2046, 2 g at 08/14/20 2046 .  chlorhexidine gluconate (MEDLINE KIT) (PERIDEX) 0.12 % solution 15 mL, 15 mL, Mouth Rinse, BID, Lanney Gins, Fuad, MD, 15 mL at 08/15/20 0738 .  Chlorhexidine Gluconate Cloth 2 % PADS 6 each, 6 each, Topical, Daily, Ottie Glazier, MD, 6 each at 08/14/20 1739 .  chlorpheniramine-HYDROcodone (TUSSIONEX) 10-8 MG/5ML suspension 5 mL, 5 mL, Per Tube, Q12H PRN, Lorella Nimrod, MD .  dexamethasone (DECADRON) injection 6 mg, 6 mg, Intravenous, Q24H, Lanney Gins, Fuad, MD, 6 mg at 08/14/20 2047 .  dexmedetomidine (PRECEDEX) 400 MCG/100ML (4 mcg/mL) infusion, 0.4-1.2 mcg/kg/hr, Intravenous, Titrated, Aleskerov, Fuad, MD, Last Rate: 23.6 mL/hr at 08/15/20 0613, 0.8 mcg/kg/hr at 08/15/20 0613 .  enoxaparin (LOVENOX) injection 60 mg, 0.5 mg/kg, Subcutaneous, Q2200, Opyd, Ilene Qua, MD, 60 mg at 08/14/20 2048 .  feeding supplement (PROSource TF) liquid 90 mL, 90 mL, Per Tube, 6 X Daily, Aleskerov, Fuad, MD, 90 mL at 08/15/20 0513 .  feeding supplement (VITAL HIGH PROTEIN) liquid 1,000 mL, 1,000 mL, Per Tube, Q24H, Aleskerov, Fuad, MD, 1,000 mL at 08/14/20  1338 .  fentaNYL (SUBLIMAZE) bolus via infusion 50-100 mcg, 50-100 mcg, Intravenous, Q2H PRN, Rust-Chester, Britton L, NP .  fentaNYL 2555mg in NS 2581m(1056mml) infusion-PREMIX, 0-400 mcg/hr, Intravenous, Continuous, Aleskerov, Fuad, MD, Last Rate: 30 mL/hr at 08/15/20 0102, 300 mcg/hr at 08/15/20 0102 .  guaiFENesin-dextromethorphan (ROBITUSSIN DM) 100-10 MG/5ML syrup 10 mL, 10 mL, Per Tube, Q4H PRN, AmiLorella NimrodD .  HYDROcodone-acetaminophen (NORCO/VICODIN) 5-325 MG per tablet 1-2 tablet, 1-2 tablet, Per Tube, Q4H PRN, Amin, Sumayya, MD .  insulin aspart (novoLOG) injection 0-20 Units, 0-20 Units, Subcutaneous, Q4H, KeeDarel Hong NP, 4 Units at 08/15/20 0352 .  MEDLINE mouth rinse, 15 mL, Mouth Rinse, 10 times per day, AleOttie GlazierD, 15 mL at 08/15/20 0513 .  midazolam (VERSED) 50 mg/50 mL (1 mg/mL) premix infusion, 0.5-10 mg/hr, Intravenous, Continuous, Aleskerov, Fuad, MD, Last Rate: 10 mL/hr at 08/15/20 0737, 10 mg/hr at 08/15/20 0737 .  midazolam (VERSED) injection 2 mg, 2 mg, Intravenous, Q2H PRN, AleOttie GlazierD, 2 mg at 08/14/20 1709 .  multivitamin liquid 15 mL, 15 mL, Per Tube, Daily, AleOttie GlazierD, 15 mL at 08/14/20 0801 .  ondansetron (ZOFRAN) tablet 4 mg, 4 mg, Oral, Q6H PRN **OR** ondansetron (ZOFRAN) injection 4 mg, 4 mg, Intravenous, Q6H PRN, Opyd, Timothy S, MD .  pantoprazole sodium (PROTONIX) 40 mg/20 mL oral suspension 40 mg, 40 mg, Per Tube, Q2200, AleOttie GlazierD, 40 mg at 08/14/20 2048 .  sennosides (SENOKOT) 8.8 MG/5ML syrup 10 mL, 10 mL, Per Tube, BID, GruDallie PilesPH, 10 mL at 08/13/20 2139 .  sodium chloride flush (NS) 0.9 % injection 10-40 mL, 10-40 mL, Intracatheter, Q12H, AmiLorella NimrodD, 10 mL at 08/14/20 2056 .  sodium chloride flush (NS) 0.9 % injection 10-40 mL, 10-40 mL, Intracatheter, PRN, AmiLorella NimrodD .  thiamine tablet 100 mg, 100 mg, Per Tube, Daily, AleOttie GlazierD, 100 mg at 08/14/20 0801 .  vecuronium  (  NORCURON) injection 10 mg, 10 mg, Intravenous, Q1H PRN, Ottie Glazier, MD, 10 mg at 08/15/20 0352    ALLERGIES   Amoxicillin    REVIEW OF SYSTEMS     Unable to obtain due to sedation on precedex drip  PHYSICAL EXAMINATION   Vital Signs: Temp:  [98.96 F (37.2 C)-101.5 F (38.6 C)] 101.5 F (38.6 C) (12/26 2300) Pulse Rate:  [73-115] 86 (12/26 2300) Resp:  [13-32] 22 (12/26 2300) BP: (111-200)/(62-89) 136/67 (12/26 2300) SpO2:  [87 %-100 %] 89 % (12/27 0252) FiO2 (%):  [50 %-90 %] 90 % (12/27 0345)  GENERAL:age appropriate sedated HEAD: Normocephalic, atraumatic.  EYES: Pupils equal, round, reactive to light.  No scleral icterus. ETT+ MOUTH: dryt mucosal membrane. NECK: Supple. No thyromegaly. No nodules. No JVD.  PULMONARY: ronchi bilaterally  CARDIOVASCULAR: S1 and S2. Regular rate and rhythm. No murmurs, rubs, or gallops.  GASTROINTESTINAL: Soft, nontender, non-distended. No masses. Positive bowel sounds. No hepatosplenomegaly.  MUSCULOSKELETAL: No swelling, clubbing, or edema.  NEUROLOGIC: GCS4T SKIN:intact,warm,dry   PERTINENT DATA     Infusions: . sodium chloride Stopped (08/13/20 1132)  . cefTRIAXone (ROCEPHIN)  IV 2 g (08/14/20 2046)  . dexmedetomidine (PRECEDEX) IV infusion 0.8 mcg/kg/hr (08/15/20 5427)  . fentaNYL infusion INTRAVENOUS 300 mcg/hr (08/15/20 0102)  . midazolam 10 mg/hr (08/15/20 0737)   Scheduled Medications: . allopurinol  300 mg Per Tube Daily  . chlorhexidine gluconate (MEDLINE KIT)  15 mL Mouth Rinse BID  . Chlorhexidine Gluconate Cloth  6 each Topical Daily  . dexamethasone (DECADRON) injection  6 mg Intravenous Q24H  . enoxaparin (LOVENOX) injection  0.5 mg/kg Subcutaneous Q2200  . feeding supplement (PROSource TF)  90 mL Per Tube 6 X Daily  . feeding supplement (VITAL HIGH PROTEIN)  1,000 mL Per Tube Q24H  . insulin aspart  0-20 Units Subcutaneous Q4H  . mouth rinse  15 mL Mouth Rinse 10 times per day  . multivitamin  15  mL Per Tube Daily  . pantoprazole sodium  40 mg Per Tube Q2200  . sennosides  10 mL Per Tube BID  . sodium chloride flush  10-40 mL Intracatheter Q12H  . thiamine  100 mg Per Tube Daily   PRN Medications: sodium chloride, acetaminophen, albuterol, chlorpheniramine-HYDROcodone, fentaNYL, guaiFENesin-dextromethorphan, HYDROcodone-acetaminophen, midazolam, ondansetron **OR** ondansetron (ZOFRAN) IV, sodium chloride flush, vecuronium Hemodynamic parameters:   Intake/Output: 12/26 0701 - 12/27 0700 In: 174.4 [I.V.:174.4] Out: 850 [Urine:850]  Ventilator  Settings: Vent Mode: PRVC FiO2 (%):  [50 %-90 %] 90 % Set Rate:  [20 bmp-24 bmp] 24 bmp Vt Set:  [500 mL] 500 mL PEEP:  [14 cmH20] 14 cmH20 Plateau Pressure:  [19 cmH20] 19 cmH20    LAB RESULTS:  Basic Metabolic Panel: Recent Labs  Lab 08/10/20 0433 08/11/20 0636 08/12/20 0409 08/13/20 0314 08/14/20 0539 08/15/20 0508  NA 139 140 145 145 145 143  K 5.8* 4.4 5.3* 4.3 4.4 4.1  CL 100 102 107 108 107 107  CO2 '29 26 29 27 31 31  ' GLUCOSE 287* 194* 179* 200* 189* 207*  BUN 9 15 25* 37* 25* 23*  CREATININE 0.69 0.50* 0.75 0.60* 0.51* 0.61  CALCIUM 8.9 8.9 8.6* 8.6* 8.6* 8.3*  MG 2.1  --   --  2.6* 2.4 2.1  PHOS 2.6  --   --  3.3 2.7 3.2   Liver Function Tests: Recent Labs  Lab 08/10/20 0433 08/11/20 0636 08/12/20 0409 08/13/20 0314 08/14/20 0539  AST 41 '28 23 19 17  ' ALT 28  '24 21 19 17  ' ALKPHOS 49 46 40 37* 34*  BILITOT 0.5 0.7 0.5 0.6 0.7  PROT 6.9 6.8 6.6 6.3* 6.1*  ALBUMIN 2.8* 2.9* 2.8* 2.7* 2.8*   No results for input(s): LIPASE, AMYLASE in the last 168 hours. No results for input(s): AMMONIA in the last 168 hours. CBC: Recent Labs  Lab 08/10/20 0433 08/11/20 0636 08/12/20 0409 08/13/20 0314 08/14/20 0539 08/15/20 0508  WBC 5.8 7.6 6.2 5.8 7.4 7.9  NEUTROABS 5.1 6.7 5.4 5.0 6.8  --   HGB 12.1* 11.8* 10.4* 9.8* 9.8* 9.0*  HCT 36.9* 36.9* 34.3* 32.8* 31.7* 30.5*  MCV 92.5 91.6 96.1 97.3 96.6 97.8   PLT 112* 133* 151 145* 152 147*   Cardiac Enzymes: No results for input(s): CKTOTAL, CKMB, CKMBINDEX, TROPONINI in the last 168 hours. BNP: Invalid input(s): POCBNP CBG: Recent Labs  Lab 08/14/20 1139 08/14/20 1604 08/14/20 1928 08/15/20 0055 08/15/20 0351  GLUCAP 144* 128* 114* 193* 188*       IMAGING RESULTS:  Imaging: DG Abd 1 View  Result Date: 08/14/2020 CLINICAL DATA:  COVID.  Intubation EXAM: ABDOMEN - 1 VIEW COMPARISON:  Two days ago FINDINGS: Enteric tube with tip and side-port over the upper stomach. Endotracheal tube tip in the trachea. Bilateral airspace disease. Cardiomegaly and vascular pedicle widening. IMPRESSION: Enteric tube in good position. Electronically Signed   By: Monte Fantasia M.D.   On: 08/14/2020 07:37   DG Chest Port 1 View  Result Date: 08/15/2020 CLINICAL DATA:  29 year old male with respiratory failure. COVID-19. EXAM: PORTABLE CHEST 1 VIEW COMPARISON:  Portable chest 08/14/2020 and earlier. FINDINGS: Portable AP semi upright view at stable endotracheal tube tip just below the clavicles. Enteric tube courses to the abdomen, stable enteric tube looped in the stomach. Stable lung volumes. Mediastinal contours remain within normal limits. No pneumothorax. No pleural effusion is evident. Increased retrocardiac opacity on the left. Otherwise widespread patchy bilateral pulmonary opacity appears stable and is most confluent at the lung bases. No acute osseous abnormality identified. Paucity of bowel gas in the upper abdomen. IMPRESSION: 1.  Stable lines and tubes. 2. Left lower lobe collapse suspected since yesterday. Otherwise stable COVID-19 pneumonia. Electronically Signed   By: Genevie Ann M.D.   On: 08/15/2020 05:55   DG Chest Port 1 View  Result Date: 08/14/2020 CLINICAL DATA:  Acute respiratory failure. EXAM: PORTABLE CHEST 1 VIEW COMPARISON:  08/13/2020 FINDINGS: 0400 hours. Endotracheal tube tip is 4.4 cm above the base of the carina. The NG tube  tip is in the stomach. Esophageal temperature probe evident. Diffuse patchy bilateral airspace disease is similar to prior. No substantial pleural effusion. Telemetry leads overlie the chest. IMPRESSION: 1. Stable exam. Patchy bilateral airspace disease. 2. Support apparatus as described. Electronically Signed   By: Misty Stanley M.D.   On: 08/14/2020 04:54   DG Chest Port 1 View  Result Date: 08/13/2020 CLINICAL DATA:  COVID positive, new intubation EXAM: PORTABLE CHEST 1 VIEW COMPARISON:  08/11/2020 FINDINGS: Interval repositioning of endotracheal tube, tip projecting over the mid trachea in appropriate position. Esophagogastric tube with tip and side port below the diaphragm, tip positioned in the gastric fundus. No significant interval change in heterogeneous bilateral airspace disease. Mild cardiomegaly. IMPRESSION: 1. Interval repositioning of endotracheal tube, tip projecting over the mid trachea in appropriate position. 2. Esophagogastric tube with tip and side port below the diaphragm, tip positioned in the gastric fundus. 3. Unchanged heterogeneous bilateral airspace disease, consistent COVID infection. Electronically Signed  By: Eddie Candle M.D.   On: 08/13/2020 12:14   '@PROBHOSP' @ DG Chest Port 1 View  Result Date: 08/15/2020 CLINICAL DATA:  29 year old male with respiratory failure. COVID-19. EXAM: PORTABLE CHEST 1 VIEW COMPARISON:  Portable chest 08/14/2020 and earlier. FINDINGS: Portable AP semi upright view at stable endotracheal tube tip just below the clavicles. Enteric tube courses to the abdomen, stable enteric tube looped in the stomach. Stable lung volumes. Mediastinal contours remain within normal limits. No pneumothorax. No pleural effusion is evident. Increased retrocardiac opacity on the left. Otherwise widespread patchy bilateral pulmonary opacity appears stable and is most confluent at the lung bases. No acute osseous abnormality identified. Paucity of bowel gas in the upper  abdomen. IMPRESSION: 1.  Stable lines and tubes. 2. Left lower lobe collapse suspected since yesterday. Otherwise stable COVID-19 pneumonia. Electronically Signed   By: Genevie Ann M.D.   On: 08/15/2020 05:55     ASSESSMENT AND PLAN    -Multidisciplinary rounds held today  Acute Hypoxic Respiratory Failure Acute COVID19 pneumonia with severe ARDS -Remdesevir antiviral - pharmacy protocol 5 d -vitamin C -zinc -decadron 4m IV daily  -Diuresis - Lasix 40 IV daily - monitor UOP - utilize external urinary catheter if possible- -Will attempt to prone patient 12-27. Patient with ostomy that is fresh and improving oxygenation. Will hold off on proning today.  -If asynchronous will change out narcotics and paralyze -d/c hepatotoxic medications while on remdesevir -baricitinib per covid protocol -supportive care with ICU telemetry monitoring -PT/OT when possible -procalcitonin, CRP and ferritin trending -baricitinib per protocol      Perforated diverticulitis   - s/p colostomy     - may present problem with proning protocol for COVID    - reviewed care plan with surgeon today - Dr CWindell Moment- appreciate input ICU telemetry  Monitoring   ID -continue IV abx as prescibed -follow up cultures  GI/Nutrition GI PROPHYLAXIS as indicated DIET-->TF's as tolerated Constipation protocol as indicated  ENDO - ICU hypoglycemic\Hyperglycemia protocol -check FSBS per protocol   ELECTROLYTES -follow labs as needed -replace as needed -pharmacy consultation   DVT/GI PRX ordered -SCDs  TRANSFUSIONS AS NEEDED MONITOR FSBS AS SESS the need for LABS as needed   Critical care provider statement:    Critical care time (minutes):  35   Critical care time was exclusive of:  Separately billable procedures and treating other patients   Critical care was necessary to treat or prevent imminent or life-threatening deterioration of the following conditions:  severe acute hypoxemic respiratory  failure, severe ARDS, perforated diverticulitis.    Critical care was time spent personally by me on the following activities:  Development of treatment plan with patient or surrogate, discussions with consultants, evaluation of patient's response to treatment, examination of patient, obtaining history from patient or surrogate, ordering and performing treatments and interventions, ordering and review of laboratory studies and re-evaluation of patient's condition.  I assumed direction of critical care for this patient from another provider in my specialty: no

## 2020-08-15 NOTE — Progress Notes (Signed)
Vent RR to 24 & FIO2 to 90% after ABG.

## 2020-08-15 NOTE — Progress Notes (Signed)
Nutrition Follow Up Note   DOCUMENTATION CODES:   Obesity unspecified  INTERVENTION:   Change to Vital 1.5 @ 36m/hr + Pro[employer] 938mQID  Free water flushes 3055m4 hours to maintain tube patency   Regimen provides 1940kcal/day, 161g/day protein and 1005m55my free water   NUTRITION DIAGNOSIS:   Inadequate oral intake related to inability to eat (pt sedated and ventilated) as evidenced by NPO status.  GOAL:   Provide needs based on ASPEN/SCCM guidelines  -met with tube feeds   MONITOR:   Vent status,Labs,Weight trends,TF tolerance,Skin,I & O's  ASSESSMENT:   29 y58male with h/o Hartmann's for perforated diverticulitis 11/22/19, s/p takedown, parastomal hernia removal and resection of 26cm small bowel 11/978/4/12plicated by leakage of anastomosis s/p revision 07/08/20 now admitted with COVID19 induced severe ARDS. Pt developed worsening respiratory failure 12/23 requiring intubation   Pt self extubated 12/25 and was re-intubated the same day. Pt remains sedated and ventilated. OGT in place. Pt tolerating tube feeds well; will adjust tube feeds as pt is no longer on proprofol. No new weight since 12/21; will request weekly weights. Plan is for pt to start proning today.    Medications reviewed and include: allopurinol, dexamethasone, lovenox, lasix, insulin, MVI, senokot, thiamine, precedex, fentanyl, zosyn, vancomycin   Labs reviewed: K 4.1 wnl, P 3.2 wnl, Mg 2.1 wnl Hgb 9.0(L), Hct 30.5(L)  Patient is currently intubated on ventilator support MV: 11.5 L/min Temp (24hrs), Avg:100.1 F (37.8 C), Min:98.96 F (37.2 C), Max:101.5 F (38.6 C)  Propofol: none   MAP- >65mm51mUOP- 850ml 72met Order:   Diet Order            Diet NPO time specified  Diet effective now                EDUCATION NEEDS:   Not appropriate for education at this time  Skin:  Skin Assessment: Reviewed RN Assessment  Last BM:  12/26- via ostomy  Height:   Ht Readings from Last 1  Encounters:  08/09/20 6' (1.829 m)    Weight:   Wt Readings from Last 1 Encounters:  08/09/20 117.9 kg    Ideal Body Weight:  80.9 kg  BMI:  Body mass index is 35.25 kg/m.  Estimated Nutritional Needs:   Kcal:  1914kcal/day  Protein:  161g/day  Fluid:  2.4-2.7L/day  Ernie Sagrero Koleen DistanceD, LDN Please refer to [facility] and/or RD on-call/weekend/after hours pager

## 2020-08-15 NOTE — Progress Notes (Addendum)
Pharmacy Antibiotic Note  Craig [name] is a 29 y.o. male recent hospitalization for perforated diverticulitis s/p colostomy with reversal and leakage of anastomosis with s/p revision now acutely hypoxemic with COVID19 induced severe ARDS  on 08/09/2020 . Pharmacy has been consulted for vancomycin and Zosyn dosing. Despite antibiotics he has worsening oxygenation (FiO2 100% and peep of 14) with new febrile episodes. His renal function has been stable since admission. He has a noted allergy to amoxicillin (reaction: rash); attending is aware and wishes to proceed.  Plan:  1) Zosyn 3.375g IV q8h (4 hour infusion)  2) start vancomycin 2000 mg IV now then 1000 mg IV every 8 hours  Ke: 0.126 h-1  T1/2: 5.5 h  Css (calculated): 26.7 / 11.1 mcg/mL  Daily BMP to assess renal function while on vancomycin  Levels as clinically indicated   Height: 6' (182.9 cm) Weight: 117.9 kg (259 lb 14.8 oz) IBW/kg (Calculated) : 77.6  Temp (24hrs), Avg:100.1 F (37.8 C), Min:98.96 F (37.2 C), Max:101.5 F (38.6 C)  Recent Labs  Lab 08/09/20 0154 08/09/20 2106 08/09/20 2111 08/10/20 0433 08/11/20 0636 08/12/20 0409 08/13/20 0314 08/14/20 0539 08/15/20 0508  WBC  --   --    < > 5.8 7.6 6.2 5.8 7.4 7.9  CREATININE  --   --    < > 0.69 0.50* 0.75 0.60* 0.51* 0.61  LATICACIDVEN 1.8 3.3*  --  1.9  --   --   --   --   --    < > = values in this interval not displayed.    Estimated Creatinine Clearance: 177.3 mL/min (by C-G formula based on SCr of 0.61 mg/dL).    Allergies  Allergen Reactions  . Amoxicillin Rash    Antimicrobials this admission: azithromycin 12/21 >> 12/23 ceftriaxone 12/22 >> 12/27 Zosyn 12/27 >> Vancomycin 12/27 >>  Microbiology results: 12/21 BCx: NG final 12/22 UCx: NG final  12/27 Sputum: pending  12/21 SARS CoV-2: positive 12/21 influenza A/B: negative 12/22 MRSA PCR: negative  Thank you for allowing pharmacy to be a part of this patient's  care.  Dallie Piles 08/15/2020 11:13 AM

## 2020-08-16 DIAGNOSIS — U071 COVID-19: Secondary | ICD-10-CM | POA: Diagnosis not present

## 2020-08-16 DIAGNOSIS — J9601 Acute respiratory failure with hypoxia: Secondary | ICD-10-CM | POA: Diagnosis not present

## 2020-08-16 LAB — BASIC METABOLIC PANEL
Anion gap: 9 (ref 5–15)
BUN: 16 mg/dL (ref 6–20)
CO2: 31 mmol/L (ref 22–32)
Calcium: 8.4 mg/dL — ABNORMAL LOW (ref 8.9–10.3)
Chloride: 104 mmol/L (ref 98–111)
Creatinine, Ser: 0.43 mg/dL — ABNORMAL LOW (ref 0.61–1.24)
GFR, Estimated: >60 mL/min (ref >60)
Glucose, Bld: 212 mg/dL — ABNORMAL HIGH (ref 70–99)
Potassium: 4.6 mmol/L (ref 3.5–5.1)
Sodium: 144 mmol/L (ref 135–145)

## 2020-08-16 LAB — BLOOD GAS, ARTERIAL
Acid-Base Excess: 15.7 mmol/L — ABNORMAL HIGH (ref 0.0–2.0)
Bicarbonate: 42 mmol/L — ABNORMAL HIGH (ref 20.0–28.0)
FIO2: 0.55
MECHVT: 500 mL
O2 Saturation: 91.2 %
PEEP: 12 cmH2O
Patient temperature: 37
RATE: 24 resp/min
pCO2 arterial: 59 mmHg — ABNORMAL HIGH (ref 32.0–48.0)
pH, Arterial: 7.46 — ABNORMAL HIGH (ref 7.350–7.450)
pO2, Arterial: 58 mmHg — ABNORMAL LOW (ref 83.0–108.0)

## 2020-08-16 LAB — CBC
HCT: 33.7 % — ABNORMAL LOW (ref 39.0–52.0)
Hemoglobin: 10.2 g/dL — ABNORMAL LOW (ref 13.0–17.0)
MCH: 29.4 pg (ref 26.0–34.0)
MCHC: 30.3 g/dL (ref 30.0–36.0)
MCV: 97.1 fL (ref 80.0–100.0)
Platelets: 164 10*3/uL (ref 150–400)
RBC: 3.47 MIL/uL — ABNORMAL LOW (ref 4.22–5.81)
RDW: 15 % (ref 11.5–15.5)
WBC: 9.5 10*3/uL (ref 4.0–10.5)
nRBC: 0 % (ref 0.0–0.2)

## 2020-08-16 LAB — GLUCOSE, CAPILLARY
Glucose-Capillary: 306 mg/dL — ABNORMAL HIGH (ref 70–99)
Glucose-Capillary: 199 mg/dL — ABNORMAL HIGH (ref 70–99)
Glucose-Capillary: 166 mg/dL — ABNORMAL HIGH (ref 70–99)
Glucose-Capillary: 180 mg/dL — ABNORMAL HIGH (ref 70–99)
Glucose-Capillary: 301 mg/dL — ABNORMAL HIGH (ref 70–99)

## 2020-08-16 LAB — MAGNESIUM: Magnesium: 2.3 mg/dL (ref 1.7–2.4)

## 2020-08-16 LAB — PHOSPHORUS: Phosphorus: 2 mg/dL — ABNORMAL LOW (ref 2.5–4.6)

## 2020-08-16 LAB — C-REACTIVE PROTEIN: CRP: 27.8 mg/dL — ABNORMAL HIGH (ref <1.0)

## 2020-08-16 MED ORDER — K PHOS MONO-SOD PHOS DI & MONO 155-852-130 MG PO TABS
500.0000 mg | ORAL_TABLET | Freq: Once | ORAL | Status: AC
  Administered 2020-08-16: 500 mg
  Filled 2020-08-16: qty 2

## 2020-08-16 MED ORDER — LINEZOLID 600 MG/300ML IV SOLN
600.0000 mg | Freq: Two times a day (BID) | INTRAVENOUS | Status: AC
  Administered 2020-08-16 – 2020-08-19 (×7): 600 mg via INTRAVENOUS
  Filled 2020-08-16 (×7): qty 300

## 2020-08-16 MED ORDER — FUROSEMIDE 10 MG/ML IJ SOLN
40.0000 mg | Freq: Two times a day (BID) | INTRAMUSCULAR | Status: AC
  Administered 2020-08-16 (×2): 40 mg via INTRAVENOUS
  Filled 2020-08-16 (×2): qty 4

## 2020-08-16 NOTE — Progress Notes (Signed)
CRITICAL CARE PROGRESS NOTE    Name: Craig [name] MRN: [id number] DOB: 05/09/1991  Referring physician: Dr Reesa Chew    LOS: [place] EVENTS    Patient description:  29 yo male recent hospitalization for perforated diverticulitis s/p colostomy with reversal and leakage of anastomosis with s/p revision now acutely hypoxemic with COVID19 induced severe ARDS.    08/11/20-patient continued to decline with worsening respiratory status despite 100%Fio2 on BIPAP and HFNC failure.  He stated that he feels he is dying and cannot breathe.  I discussed with wife Craig [name] earlier today that patient is critically ill may need ETT, she is agreeable and thankful for care. I specifically discussed and explained intubation and mechanical ventilation to patient and he wishes to proceed.    08/12/20- patient was weaned on FiO2 to 60%. Spoke to wife Craig [name].   08/13/20- Patient weaned to 40%.  During SBT today he self extubated and was placed on HFNC.  He subsequently became hypoxemic, disoriented aggitated, removed PIVs and colostomy and proceeded to have combative behavior with worsening oxygenation. I have attempted to calm patient and encouraged him to cooperate.  After numerous attempts patient continued to be combative with severe aggitation with combative behavior and would not wear oxygen. Patient was placed on sedation and MV.  Wife updated.    08/14/20- patient remains critically ill.  Weaned from 100%>>55%. Called and updated wife [name] today  12/27-Patient with worsening oxygenation on FiO2 100% peep of 14   08/16/20-Patient with continued high oxygen requirements overnight. Have spoken with surgery and there should be no problem proning patient with this. Was febrile yesterday and thus  broad spectrum abx started and cultures attained.   Lines/tubes : Closed System Drain 1 Right Abdomen Bulb (JP) 19 Fr. (Active)     Colostomy LLQ (Active)  Stoma Assessment Pink;Red 08/10/20 0944  Peristomal Assessment Intact 08/10/20 0944    Microbiology/Sepsis markers: Results for orders placed or performed during the hospital encounter of 08/09/20  Culture, blood (Routine X 2) w Reflex to ID Panel     Status: None   Collection Time: 08/09/20  9:06 PM   Specimen: BLOOD  Result Value Ref Range Status   Specimen Description BLOOD RIGHT ARM  Final   Special Requests   Final    BOTTLES DRAWN AEROBIC AND ANAEROBIC Blood Culture adequate volume   Culture   Final    NO GROWTH 5 DAYS Performed at [facility], [address] [place], [place] [address]    Report Status 08/15/2020 FINAL  Final  Culture, blood (Routine X 2) w Reflex to ID Panel     Status: None   Collection Time: 08/09/20  9:06 PM   Specimen: BLOOD  Result Value Ref Range Status   Specimen Description BLOOD RIGHT [facility]  Final   Special Requests   Final    BOTTLES DRAWN AEROBIC AND ANAEROBIC Blood Culture adequate volume   Culture   Final    NO GROWTH 5 DAYS Performed at [facility], [address] [place], [place] [address]    Report Status 08/15/2020 FINAL  Final  Resp Panel by RT-PCR (Flu A&B, Covid) Nasopharyngeal Swab     Status: Abnormal   Collection Time: 08/09/20  9:11 PM   Specimen: Nasopharyngeal Swab; Nasopharyngeal(NP) swabs in vial transport medium  Result Value Ref Range Status   SARS Coronavirus 2 by RT PCR POSITIVE (A) NEGATIVE Final    Comment: RESULT CALLED  TO, READ BACK BY AND VERIFIED WITH: JANE MARTIN '@[id number]'  ON 08/09/20 SKL (NOTE) SARS-CoV-2 target nucleic acids are DETECTED.  The SARS-CoV-2 RNA is generally detectable in upper respiratory specimens during the acute phase of infection. Positive results are indicative of the presence of the identified virus, but do not  rule out bacterial infection or co-infection with other pathogens not detected by the test. Clinical correlation with patient history and other diagnostic information is necessary to determine patient infection status. The expected result is Negative.  Fact Sheet for Patients: EntrepreneurPulse.com.au  Fact Sheet for Healthcare Providers: IncredibleEmployment.be  This test is not yet approved or cleared by the [place] FDA and  has been authorized for detection and/or diagnosis of SARS-CoV-2 by FDA under an Emergency Use Authorization (EUA).  This EUA will remain in effect (meaning this test can be  used) for the duration of  the COVID-19 declaration under Section 564(b)(1) of the Act, 21 U.S.C. section 360bbb-3(b)(1), unless the authorization is terminated or revoked sooner.     Influenza A by PCR NEGATIVE NEGATIVE Final   Influenza B by PCR NEGATIVE NEGATIVE Final    Comment: (NOTE) The Xpert Xpress SARS-CoV-2/FLU/RSV plus assay is intended as an aid in the diagnosis of influenza from Nasopharyngeal swab specimens and should not be used as a sole basis for treatment. Nasal washings and aspirates are unacceptable for Xpert Xpress SARS-CoV-2/FLU/RSV testing.  Fact Sheet for Patients: EntrepreneurPulse.com.au  Fact Sheet for Healthcare Providers: IncredibleEmployment.be  This test is not yet approved or cleared by the [place] FDA and has been authorized for detection and/or diagnosis of SARS-CoV-2 by FDA under an Emergency Use Authorization (EUA). This EUA will remain in effect (meaning this test can be used) for the duration of the COVID-19 declaration under Section 564(b)(1) of the Act, 21 U.S.C. section 360bbb-3(b)(1), unless the authorization is terminated or revoked.  Performed at [facility], [address], [place], [place] [address]   Urine culture     Status: None    Collection Time: 08/10/20  4:33 AM   Specimen: In/Out Cath Urine  Result Value Ref Range Status   Specimen Description   Final    IN/OUT CATH URINE Performed at [facility], [address] [place], [place] [address]    Special Requests   Final    NONE Performed at [facility], [address] [place], [place] [address]    Culture   Final    NO GROWTH Performed at [facility], [place] [address] [place], [place] [address]    Report Status 08/11/2020 FINAL  Final  MRSA PCR Screening     Status: None   Collection Time: 08/10/20 12:40 PM   Specimen: Nasopharyngeal  Result Value Ref Range Status   MRSA by PCR NEGATIVE NEGATIVE Final    Comment:        The GeneXpert MRSA Assay (FDA approved for NASAL specimens only), is one component of a comprehensive MRSA colonization surveillance program. It is not intended to diagnose MRSA infection nor to guide or monitor treatment for MRSA infections. Performed at [facility], [address] [place], [place] [address]   CULTURE, BLOOD (ROUTINE X 2) w Reflex to ID Panel     Status: None (Preliminary result)   Collection Time: 08/15/20  1:14 PM   Specimen: BLOOD  Result Value Ref Range Status   Specimen Description BLOOD BLOOD LEFT HAND  Final   Special Requests   Final  BOTTLES DRAWN AEROBIC AND ANAEROBIC Blood Culture adequate volume   Culture   Final    NO GROWTH < 24 HOURS Performed at [facility], [address] [place], [place] [address]    Report Status PENDING  Incomplete  Culture, respiratory (non-expectorated)     Status: None (Preliminary result)   Collection Time: 08/15/20  2:00 PM   Specimen: Tracheal Aspirate; Respiratory  Result Value Ref Range Status   Specimen Description   Final    TRACHEAL ASPIRATE Performed at [facility], [address] [place], [place] [address]    Special Requests   Final    NONE Performed at [facility], [address] [place], [place] [address]    Gram Stain   Final    FEW WBC PRESENT, PREDOMINANTLY PMN FEW GRAM POSITIVE COCCI IN CLUSTERS IN CHAINS FEW YEAST Performed at [facility], [place] [address] [place], [place] [address]    Culture PENDING  Incomplete   Report Status PENDING  Incomplete  CULTURE, BLOOD (ROUTINE X 2) w Reflex to ID Panel     Status: None (Preliminary result)   Collection Time: 08/15/20  2:34 PM   Specimen: BLOOD  Result Value Ref Range Status   Specimen Description BLOOD BLOOD LEFT HAND  Final   Special Requests   Final    BOTTLES DRAWN AEROBIC AND ANAEROBIC Blood Culture adequate volume   Culture   Final    NO GROWTH < 24 HOURS Performed at [facility], [address] [place], [place] [address]    Report Status PENDING  Incomplete    Anti-infectives:  Anti-infectives (From admission, onward)   Start     Dose/Rate Route Frequency Ordered Stop   08/15/20 1400  piperacillin-tazobactam (ZOSYN) IVPB 3.375 g        3.375 g 12.5 mL/hr over 240 Minutes Intravenous Every 8 hours 08/15/20 1105     08/15/20 1200  vancomycin (VANCOREADY) IVPB 2000 mg/400 mL        2,000 mg 200 mL/hr over 120 Minutes Intravenous  Once 08/15/20 1105 08/15/20 1411   08/13/20 1400  remdesivir 100 mg in sodium chloride 0.9 % 100 mL IVPB        100 mg 200 mL/hr over 30 Minutes Intravenous  Once 08/13/20 1248 08/13/20 1731   08/11/20 0100  cefTRIAXone (ROCEPHIN) 2 g in sodium chloride 0.9 % 100 mL IVPB  Status:  Discontinued        2 g 200 mL/hr over 30 Minutes Intravenous Every 24 hours 08/10/20 0859 08/15/20 1105   08/10/20 1000  remdesivir 100 mg in sodium chloride 0.9 % 100 mL IVPB       "Followed by" Linked Group Details   100 mg 200 mL/hr over 30 Minutes Intravenous Daily 08/09/20 2236 08/14/20 0959   08/10/20 0000  cefTRIAXone (ROCEPHIN) 2 g in sodium chloride 0.9 % 100 mL IVPB        2 g 200 mL/hr over 30 Minutes Intravenous  Once 08/09/20 2352 08/10/20 0125    08/09/20 2345  levofloxacin (LEVAQUIN) IVPB 750 mg  Status:  Discontinued        750 mg 100 mL/hr over 90 Minutes Intravenous  Once 08/09/20 2339 08/09/20 2351   08/09/20 2300  remdesivir 200 mg in sodium chloride 0.9% 250 mL IVPB       "Followed by" Linked Group Details   200 mg 580 mL/hr over 30 Minutes Intravenous Once 08/09/20 2236 08/10/20 0201  08/09/20 2300  azithromycin (ZITHROMAX) 500 mg in sodium chloride 0.9 % 250 mL IVPB        500 mg 250 mL/hr over 60 Minutes Intravenous Every 24 hours 08/10/20 0859 08/12/20 2259   08/09/20 2230  azithromycin (ZITHROMAX) 500 mg in sodium chloride 0.9 % 250 mL IVPB        500 mg 250 mL/hr over 60 Minutes Intravenous  Once 08/09/20 2227 08/10/20 0038        PAST MEDICAL HISTORY   Past Medical History:  Diagnosis Date  . Gout   . Hypertension   . Rupture of bowel [facility])      SURGICAL HISTORY   Past Surgical History:  Procedure Laterality Date  . COLONOSCOPY WITH PROPOFOL N/A 05/18/2020   Procedure: COLONOSCOPY WITH PROPOFOL;  Surgeon: Benjamine Sprague, DO;  Location: [facility] ENDOSCOPY;  Service: General;  Laterality: N/A;  . COLOSTOMY N/A 11/22/2019   Procedure: COLOSTOMY;  Surgeon: Herbert Pun, MD;  Location: [facility] ORS;  Service: General;  Laterality: N/A;  . LAPAROTOMY N/A 11/22/2019   Procedure: EXPLORATORY LAPAROTOMY;  Surgeon: Herbert Pun, MD;  Location: [facility] ORS;  Service: General;  Laterality: N/A;  . LAPAROTOMY N/A 07/08/2020   Procedure: EXPLORATORY LAPAROTOMY;  Surgeon: Herbert Pun, MD;  Location: [facility] ORS;  Service: General;  Laterality: N/A;  . PARTIAL COLECTOMY N/A 11/22/2019   Procedure: PARTIAL COLECTOMY;  Surgeon: Herbert Pun, MD;  Location: [facility] ORS;  Service: General;  Laterality: N/A;  . XI ROBOTIC ASSISTED COLOSTOMY TAKEDOWN N/A 06/28/2020   Procedure: XI ROBOTIC ASSISTED COLOSTOMY TAKEDOWN CONVERTED TO OPEN PROCEDURE;  Surgeon: Herbert Pun, MD;  Location: [facility] ORS;  Service:  General;  Laterality: N/A;     FAMILY HISTORY   Family History  Problem Relation Age of Onset  . Healthy Mother   . Healthy Father      SOCIAL HISTORY   Social History   Tobacco Use  . Smoking status: Never Smoker  . Smokeless tobacco: Current User    Types: Chew  Vaping Use  . Vaping Use: Never used  Substance Use Topics  . Alcohol use: Yes    Comment: pint to a fifth per day per patient . Patient reports he hasn't had anything to drink in three weeks.  . Drug use: Never     MEDICATIONS   Current Medication:  Current Facility-Administered Medications:  .  0.9 %  sodium chloride infusion, 250 mL, Intravenous, PRN, Opyd, Ilene Qua, MD, Stopped at 08/13/20 1132 .  acetaminophen (TYLENOL) tablet 650 mg, 650 mg, Per Tube, Q4H PRN, Rust-Chester, Britton L, NP, 650 mg at 08/16/20 0745 .  albuterol (VENTOLIN HFA) 108 (90 Base) MCG/ACT inhaler 2 puff, 2 puff, Inhalation, Q6H PRN, Opyd, Ilene Qua, MD .  allopurinol (ZYLOPRIM) tablet 300 mg, 300 mg, Per Tube, Daily, Lorella Nimrod, MD, 300 mg at 08/16/20 0745 .  chlorhexidine gluconate (MEDLINE KIT) (PERIDEX) 0.12 % solution 15 mL, 15 mL, Mouth Rinse, BID, Lanney Gins, Fuad, MD, 15 mL at 08/16/20 0747 .  Chlorhexidine Gluconate Cloth 2 % PADS 6 each, 6 each, Topical, Daily, Ottie Glazier, MD, 6 each at 08/15/20 1511 .  chlorpheniramine-HYDROcodone (TUSSIONEX) 10-8 MG/5ML suspension 5 mL, 5 mL, Per Tube, Q12H PRN, Lorella Nimrod, MD .  dexamethasone (DECADRON) injection 6 mg, 6 mg, Intravenous, Q24H, Lanney Gins, Fuad, MD, 6 mg at 08/15/20 2210 .  dexmedetomidine (PRECEDEX) 400 MCG/100ML (4 mcg/mL) infusion, 0.4-1.2 mcg/kg/hr, Intravenous, Titrated, Aleskerov, Fuad, MD, Last Rate: 23.6 mL/hr at 08/16/20 0853, 0.8 mcg/kg/hr at 08/16/20  5956 .  enoxaparin (LOVENOX) injection 60 mg, 0.5 mg/kg, Subcutaneous, Q2200, Opyd, Ilene Qua, MD, 60 mg at 08/15/20 2210 .  feeding supplement (PROSource TF) liquid 90 mL, 90 mL, Per Tube, QID, Tyna Jaksch, MD, 90 mL at 08/16/20 0749 .  feeding supplement (VITAL 1.5 CAL) liquid 1,000 mL, 1,000 mL, Per Tube, Continuous, Tyna Jaksch, MD, Last Rate: 45 mL/hr at 08/15/20 1440, 1,000 mL at 08/15/20 1440 .  fentaNYL (SUBLIMAZE) bolus via infusion 50-100 mcg, 50-100 mcg, Intravenous, Q2H PRN, Rust-Chester, Britton L, NP, 50 mcg at 08/15/20 1207 .  fentaNYL 2577mg in NS 2553m(109mml) infusion-PREMIX, 0-400 mcg/hr, Intravenous, Continuous, Aleskerov, Fuad, MD, Last Rate: 30 mL/hr at 08/16/20 0852, 300 mcg/hr at 08/16/20 0852 .  guaiFENesin-dextromethorphan (ROBITUSSIN DM) 100-10 MG/5ML syrup 10 mL, 10 mL, Per Tube, Q4H PRN, AmiLorella NimrodD .  HYDROcodone-acetaminophen (NORCO/VICODIN) 5-325 MG per tablet 1-2 tablet, 1-2 tablet, Per Tube, Q4H PRN, AmiLorella NimrodD .  insulin aspart (novoLOG) injection 0-20 Units, 0-20 Units, Subcutaneous, Q4H, KeeDarel Hong NP, 4 Units at 08/16/20 0801 .  MEDLINE mouth rinse, 15 mL, Mouth Rinse, 10 times per day, AleOttie GlazierD, 15 mL at 08/16/20 0524 .  midazolam (VERSED) 50 mg/50 mL (1 mg/mL) premix infusion, 0.5-10 mg/hr, Intravenous, Continuous, Aleskerov, Fuad, MD, Last Rate: 10 mL/hr at 08/16/20 0700, 10 mg/hr at 08/16/20 0700 .  midazolam (VERSED) injection 2 mg, 2 mg, Intravenous, Q2H PRN, AleOttie GlazierD, 2 mg at 08/14/20 1709 .  ondansetron (ZOFRAN) tablet 4 mg, 4 mg, Oral, Q6H PRN **OR** ondansetron (ZOFRAN) injection 4 mg, 4 mg, Intravenous, Q6H PRN, Opyd, Timothy S, MD .  pantoprazole sodium (PROTONIX) 40 mg/20 mL oral suspension 40 mg, 40 mg, Per Tube, Q2200, AleOttie GlazierD, 40 mg at 08/15/20 2338 .  piperacillin-tazobactam (ZOSYN) IVPB 3.375 g, 3.375 g, Intravenous, Q8H, IzqTyna JakschD, Last Rate: 12.5 mL/hr at 08/16/20 0700, Infusion Verify at 08/16/20 0700 .  sennosides (SENOKOT) 8.8 MG/5ML syrup 10 mL, 10 mL, Per Tube, BID, GruDallie PilesPH, 10 mL at 08/15/20 2210 .  sodium chloride flush (NS) 0.9 % injection  10-40 mL, 10-40 mL, Intracatheter, Q12H, AmiLorella NimrodD, 20 mL at 08/16/20 0748 .  sodium chloride flush (NS) 0.9 % injection 10-40 mL, 10-40 mL, Intracatheter, PRN, AmiLorella NimrodD .  vecuronium (NORCURON) injection 10 mg, 10 mg, Intravenous, Q1H PRN, AleOttie GlazierD, 10 mg at 08/16/20 [id number] ALLERGIES   Amoxicillin    REVIEW OF SYSTEMS     Unable to obtain due to sedation on precedex drip  PHYSICAL EXAMINATION   Vital Signs: Temp:  [99.5 F (37.5 C)-102.56 F (39.2 C)] 99.5 F (37.5 C) (12/28 0800) Pulse Rate:  [72-95] 76 (12/28 0800) Resp:  [12-35] 23 (12/28 0800) BP: (112-140)/(55-76) 139/55 (12/28 0800) SpO2:  [54 %-99 %] 95 % (12/28 0800) FiO2 (%):  [70 %-100 %] 90 % (12/28 0824)  GENERAL:age appropriate sedated HEAD: Normocephalic, atraumatic.  EYES: Pupils equal, round, reactive to light.  No scleral icterus. ETT+ MOUTH: dryt mucosal membrane. NECK: Supple. No thyromegaly. No nodules. No JVD.  PULMONARY: ronchi bilaterally  CARDIOVASCULAR: S1 and S2. Regular rate and rhythm. No murmurs, rubs, or gallops.  GASTROINTESTINAL: Soft, nontender, non-distended. No masses. Positive bowel sounds. No hepatosplenomegaly.  MUSCULOSKELETAL: No swelling, clubbing, or edema.  NEUROLOGIC: GCS4T SKIN:intact,warm,dry   PERTINENT DATA     Infusions: . sodium chloride Stopped (08/13/20 1132)  . dexmedetomidine (PRECEDEX) IV infusion  0.8 mcg/kg/hr (08/16/20 0853)  . feeding supplement (VITAL 1.5 CAL) 1,000 mL (08/15/20 1440)  . fentaNYL infusion INTRAVENOUS 300 mcg/hr (08/16/20 0852)  . midazolam 10 mg/hr (08/16/20 0700)  . piperacillin-tazobactam (ZOSYN)  IV 12.5 mL/hr at 08/16/20 0700   Scheduled Medications: . allopurinol  300 mg Per Tube Daily  . chlorhexidine gluconate (MEDLINE KIT)  15 mL Mouth Rinse BID  . Chlorhexidine Gluconate Cloth  6 each Topical Daily  . dexamethasone (DECADRON) injection  6 mg Intravenous Q24H  . enoxaparin (LOVENOX) injection   0.5 mg/kg Subcutaneous Q2200  . feeding supplement (PROSource TF)  90 mL Per Tube QID  . insulin aspart  0-20 Units Subcutaneous Q4H  . mouth rinse  15 mL Mouth Rinse 10 times per day  . pantoprazole sodium  40 mg Per Tube Q2200  . sennosides  10 mL Per Tube BID  . sodium chloride flush  10-40 mL Intracatheter Q12H   PRN Medications: sodium chloride, acetaminophen, albuterol, chlorpheniramine-HYDROcodone, fentaNYL, guaiFENesin-dextromethorphan, HYDROcodone-acetaminophen, midazolam, ondansetron **OR** ondansetron (ZOFRAN) IV, sodium chloride flush, vecuronium Hemodynamic parameters:   Intake/Output: 12/27 0701 - 12/28 0700 In: 3424.9 [I.V.:1542.7; NG/GT:1362; IV Piggyback:520.2] Out: 2965 [Urine:2725; Stool:240]  Ventilator  Settings: Vent Mode: PRVC FiO2 (%):  [70 %-100 %] 90 % Set Rate:  [24 bmp] 24 bmp Vt Set:  [500 mL] 500 mL PEEP:  [14 cmH20] 14 cmH20 Plateau Pressure:  [16 cmH20-27 cmH20] 27 cmH20    LAB RESULTS:  Basic Metabolic Panel: Recent Labs  Lab 08/10/20 0433 08/11/20 0636 08/12/20 0409 08/13/20 0314 08/14/20 0539 08/15/20 0508 08/16/20 0749  NA 139   < > 145 145 145 143 144  K 5.8*   < > 5.3* 4.3 4.4 4.1 4.6  CL 100   < > 107 108 107 107 104  CO2 29   < > '29 27 31 31 31  ' GLUCOSE 287*   < > 179* 200* 189* 207* 212*  BUN 9   < > 25* 37* 25* 23* 16  CREATININE 0.69   < > 0.75 0.60* 0.51* 0.61 0.43*  CALCIUM 8.9   < > 8.6* 8.6* 8.6* 8.3* 8.4*  MG 2.1  --   --  2.6* 2.4 2.1 2.3  PHOS 2.6  --   --  3.3 2.7 3.2 2.0*   < > = values in this interval not displayed.   Liver Function Tests: Recent Labs  Lab 08/10/20 0433 08/11/20 0636 08/12/20 0409 08/13/20 0314 08/14/20 0539  AST 41 '28 23 19 17  ' ALT '28 24 21 19 17  ' ALKPHOS 49 46 40 37* 34*  BILITOT 0.5 0.7 0.5 0.6 0.7  PROT 6.9 6.8 6.6 6.3* 6.1*  ALBUMIN 2.8* 2.9* 2.8* 2.7* 2.8*   No results for input(s): LIPASE, AMYLASE in the last 168 hours. No results for input(s): AMMONIA in the last 168  hours. CBC: Recent Labs  Lab 08/10/20 0433 08/11/20 0636 08/12/20 0409 08/13/20 0314 08/14/20 0539 08/15/20 0508 08/16/20 0749  WBC 5.8 7.6 6.2 5.8 7.4 7.9 9.5  NEUTROABS 5.1 6.7 5.4 5.0 6.8  --   --   HGB 12.1* 11.8* 10.4* 9.8* 9.8* 9.0* 10.2*  HCT 36.9* 36.9* 34.3* 32.8* 31.7* 30.5* 33.7*  MCV 92.5 91.6 96.1 97.3 96.6 97.8 97.1  PLT 112* 133* 151 145* 152 147* 164   Cardiac Enzymes: No results for input(s): CKTOTAL, CKMB, CKMBINDEX, TROPONINI in the last 168 hours. BNP: Invalid input(s): POCBNP CBG: Recent Labs  Lab 08/15/20 1705 08/15/20 1924 08/15/20 2311 08/16/20  0318 08/16/20 0744  GLUCAP 133* 154* 225* 306* 199*       IMAGING RESULTS:  Imaging: DG Chest Port 1 View  Result Date: 08/15/2020 CLINICAL DATA:  29 year old male with respiratory failure. COVID-19. EXAM: PORTABLE CHEST 1 VIEW COMPARISON:  Portable chest 08/14/2020 and earlier. FINDINGS: Portable AP semi upright view at stable endotracheal tube tip just below the clavicles. Enteric tube courses to the abdomen, stable enteric tube looped in the stomach. Stable lung volumes. Mediastinal contours remain within normal limits. No pneumothorax. No pleural effusion is evident. Increased retrocardiac opacity on the left. Otherwise widespread patchy bilateral pulmonary opacity appears stable and is most confluent at the lung bases. No acute osseous abnormality identified. Paucity of bowel gas in the upper abdomen. IMPRESSION: 1.  Stable lines and tubes. 2. Left lower lobe collapse suspected since yesterday. Otherwise stable COVID-19 pneumonia. Electronically Signed   By: Genevie Ann M.D.   On: 08/15/2020 05:55   '@PROBHOSP' @ No results found.   ASSESSMENT AND PLAN    -Multidisciplinary rounds held today  Acute Hypoxic Respiratory Failure Acute COVID19 pneumonia with severe ARDS -Remdesevir antiviral - pharmacy protocol 5 d -vitamin C -zinc -decadron 23m IV daily  -Diuresis - Lasix 40 IV daily - monitor UOP -  utilize external urinary catheter if possible- -Will attempt to prone again tody -d/c hepatotoxic medications while on remdesevir -baricitinib per covid protocol -supportive care with ICU telemetry monitoring -PT/OT when possible -procalcitonin, CRP and ferritin trending -baricitinib per protocol -Attain echo. If right heart strain will start heparin empirically for PE.  -Broad spectrum abx for potential VAP. FU cultures and de escalate accordingly.         Perforated diverticulitis   - s/p colostomy     - may present problem with proning protocol for COVID    - reviewed care plan and okay to prone with this ICU telemetry  Monitoring   ID -continue IV abx as prescibed -follow up cultures  GI/Nutrition GI PROPHYLAXIS as indicated DIET-->TF's as tolerated Constipation protocol as indicated  ENDO - ICU hypoglycemic\Hyperglycemia protocol -check FSBS per protocol   ELECTROLYTES -follow labs as needed -replace as needed -pharmacy consultation   DVT/GI PRX ordered -SCDs  TRANSFUSIONS AS NEEDED MONITOR FSBS AS SESS the need for LABS as needed   Critical care provider statement:    Critical care time (minutes):  35   Critical care time was exclusive of:  Separately billable procedures and treating other patients   Critical care was necessary to treat or prevent imminent or life-threatening deterioration of the following conditions:  severe acute hypoxemic respiratory failure, severe ARDS, perforated diverticulitis.    Critical care was time spent personally by me on the following activities:  Development of treatment plan with patient or surrogate, discussions with consultants, evaluation of patient's response to treatment, examination of patient, obtaining history from patient or surrogate, ordering and performing treatments and interventions, ordering and review of laboratory studies and re-evaluation of patient's condition.  I assumed direction of critical care for  this patient from another provider in my specialty: no

## 2020-08-16 NOTE — Progress Notes (Signed)
Inpatient Diabetes Program Recommendations  AACE/ADA: New Consensus Statement on Inpatient Glycemic Control   Target Ranges:  Prepandial:   less than 140 mg/dL      Peak postprandial:   less than 180 mg/dL (1-2 hours)      Critically ill patients:  140 - 180 mg/dL  Results for BREYLAN, LEFEVERS (MRN [id number]) as of 08/16/2020 09:19  Ref. Range 08/15/2020 08:04 08/15/2020 11:47 08/15/2020 17:05 08/15/2020 19:24 08/15/2020 23:11 08/16/2020 03:18 08/16/2020 07:44  Glucose-Capillary Latest Ref Range: 70 - 99 mg/dL 644 (H) 034 (H) 742 (H) 154 (H) 225 (H) 306 (H) 199 (H)    Review of Glycemic Control  Diabetes history: No Outpatient Diabetes medications: NA Current orders for Inpatient glycemic control: Novolog 0-20 units Q4H; Decadron 6 mg Q24H, Vital @ 45 ml/hr  Inpatient Diabetes Program Recommendations:    Insulin: If steroids and tube feeding are continued as ordered, please consider ordering Novolog 4 units Q4H for tube feeding coverage.  Thanks, Orlando Penner, RN, MSN, CDE Diabetes Coordinator Inpatient Diabetes Program [phone] (Team Pager from 8am to 5pm)

## 2020-08-17 ENCOUNTER — Inpatient Hospital Stay
Admit: 2020-08-17 | Discharge: 2020-08-17 | Disposition: A | Discharge status: Home / Self Care | Payer: No Typology Code available for payment source | Attending: Pulmonary Disease | Admitting: Pulmonary Disease

## 2020-08-17 DIAGNOSIS — U071 COVID-19: Secondary | ICD-10-CM | POA: Diagnosis not present

## 2020-08-17 DIAGNOSIS — J9601 Acute respiratory failure with hypoxia: Secondary | ICD-10-CM | POA: Diagnosis not present

## 2020-08-17 LAB — GLUCOSE, CAPILLARY
Glucose-Capillary: 180 mg/dL — ABNORMAL HIGH (ref 70–99)
Glucose-Capillary: 223 mg/dL — ABNORMAL HIGH (ref 70–99)
Glucose-Capillary: 236 mg/dL — ABNORMAL HIGH (ref 70–99)
Glucose-Capillary: 244 mg/dL — ABNORMAL HIGH (ref 70–99)
Glucose-Capillary: 245 mg/dL — ABNORMAL HIGH (ref 70–99)
Glucose-Capillary: 252 mg/dL — ABNORMAL HIGH (ref 70–99)
Glucose-Capillary: 261 mg/dL — ABNORMAL HIGH (ref 70–99)

## 2020-08-17 LAB — BASIC METABOLIC PANEL
Anion gap: 8 (ref 5–15)
BUN: 21 mg/dL — ABNORMAL HIGH (ref 6–20)
CO2: 37 mmol/L — ABNORMAL HIGH (ref 22–32)
Calcium: 8.9 mg/dL (ref 8.9–10.3)
Chloride: 100 mmol/L (ref 98–111)
Creatinine, Ser: 0.4 mg/dL — ABNORMAL LOW (ref 0.61–1.24)
GFR, Estimated: >60 mL/min (ref >60)
Glucose, Bld: 245 mg/dL — ABNORMAL HIGH (ref 70–99)
Potassium: 4 mmol/L (ref 3.5–5.1)
Sodium: 145 mmol/L (ref 135–145)

## 2020-08-17 LAB — MAGNESIUM: Magnesium: 2.1 mg/dL (ref 1.7–2.4)

## 2020-08-17 LAB — ECHOCARDIOGRAM COMPLETE
Height: 72 in
S' Lateral: 2.32 cm
Weight: 4158.76 oz

## 2020-08-17 LAB — PHOSPHORUS: Phosphorus: 1.8 mg/dL — ABNORMAL LOW (ref 2.5–4.6)

## 2020-08-17 MED ORDER — FUROSEMIDE 10 MG/ML IJ SOLN
40.0000 mg | Freq: Two times a day (BID) | INTRAMUSCULAR | Status: AC
  Administered 2020-08-17 (×2): 40 mg via INTRAVENOUS
  Filled 2020-08-17 (×2): qty 4

## 2020-08-17 MED ORDER — CLONIDINE HCL 0.1 MG PO TABS
0.3000 mg | ORAL_TABLET | Freq: Four times a day (QID) | ORAL | Status: DC
  Administered 2020-08-17 – 2020-08-18 (×4): 0.3 mg via ORAL
  Filled 2020-08-17 (×4): qty 3

## 2020-08-17 MED ORDER — K PHOS MONO-SOD PHOS DI & MONO 155-852-130 MG PO TABS
500.0000 mg | ORAL_TABLET | Freq: Three times a day (TID) | ORAL | Status: AC
  Administered 2020-08-17 (×3): 500 mg
  Filled 2020-08-17 (×4): qty 2

## 2020-08-17 MED ORDER — INSULIN ASPART 100 UNIT/ML ~~LOC~~ SOLN
4.0000 [IU] | SUBCUTANEOUS | Status: DC
  Administered 2020-08-17 – 2020-08-20 (×21): 4 [IU] via SUBCUTANEOUS
  Filled 2020-08-17 (×21): qty 1

## 2020-08-17 NOTE — Progress Notes (Signed)
CRITICAL CARE PROGRESS NOTE    Name: Craig [name] MRN: [id number] DOB: 1991/08/15  Referring physician: Dr Reesa Chew    LOS: Braden EVENTS    Patient description:  29 yo male recent hospitalization for perforated diverticulitis s/p colostomy with reversal and leakage of anastomosis with s/p revision now acutely hypoxemic with COVID19 induced severe ARDS.    08/11/20-patient continued to decline with worsening respiratory status despite 100%Fio2 on BIPAP and HFNC failure.  He stated that he feels he is dying and cannot breathe.  I discussed with wife Craig [name] earlier today that patient is critically ill may need ETT, she is agreeable and thankful for care. I specifically discussed and explained intubation and mechanical ventilation to patient and he wishes to proceed.    08/12/20- patient was weaned on FiO2 to 60%. Spoke to wife Craig [name].   08/13/20- Patient weaned to 40%.  During SBT today he self extubated and was placed on HFNC.  He subsequently became hypoxemic, disoriented aggitated, removed PIVs and colostomy and proceeded to have combative behavior with worsening oxygenation. I have attempted to calm patient and encouraged him to cooperate.  After numerous attempts patient continued to be combative with severe aggitation with combative behavior and would not wear oxygen. Patient was placed on sedation and MV.  Wife updated.    08/14/20- patient remains critically ill.  Weaned from 100%>>55%. Called and updated wife Craig [name] today  12/27-Patient with worsening oxygenation on FiO2 100% peep of 14   08/16/20-Patient with continued high oxygen requirements overnight. Have spoken with surgery and there should be no problem proning patient with this. Was febrile yesterday and thus  broad spectrum abx started and cultures attained.    08-17-20-patient down to PEEP 10 FiO2 55%.  Patient never required proning   Lines/tubes : Closed System Drain 1 Right Abdomen Bulb (JP) 19 Fr. (Active)     Colostomy LLQ (Active)  Stoma Assessment Pink;Red 08/10/20 0944  Peristomal Assessment Intact 08/10/20 0944    Microbiology/Sepsis markers: Results for orders placed or performed during the hospital encounter of 08/09/20  Culture, blood (Routine X 2) w Reflex to ID Panel     Status: None   Collection Time: 08/09/20  9:06 PM   Specimen: BLOOD  Result Value Ref Range Status   Specimen Description BLOOD RIGHT ARM  Final   Special Requests   Final    BOTTLES DRAWN AEROBIC AND ANAEROBIC Blood Culture adequate volume   Culture   Final    NO GROWTH 5 DAYS Performed at [facility], [address], [place], [place] [address]    Report Status 08/15/2020 FINAL  Final  Culture, blood (Routine X 2) w Reflex to ID Panel     Status: None   Collection Time: 08/09/20  9:06 PM   Specimen: BLOOD  Result Value Ref Range Status   Specimen Description BLOOD RIGHT [facility]  Final   Special Requests   Final    BOTTLES DRAWN AEROBIC AND ANAEROBIC Blood Culture adequate volume   Culture   Final    NO GROWTH 5 DAYS Performed at [facility], [address] [place], [place] [address]    Report Status 08/15/2020 FINAL  Final  Resp Panel by RT-PCR (Flu A&B, Covid) Nasopharyngeal Swab     Status: Abnormal   Collection Time: 08/09/20  9:11 PM   Specimen: Nasopharyngeal Swab; Nasopharyngeal(NP) swabs in vial transport medium  Result Value Ref Range Status   SARS  Coronavirus 2 by RT PCR POSITIVE (A) NEGATIVE Final    Comment: RESULT CALLED TO, READ BACK BY AND VERIFIED WITH: JANE MARTIN '@[id number]'  ON 08/09/20 SKL (NOTE) SARS-CoV-2 target nucleic acids are DETECTED.  The SARS-CoV-2 RNA is generally detectable in upper respiratory specimens during the acute phase of infection.  Positive results are indicative of the presence of the identified virus, but do not rule out bacterial infection or co-infection with other pathogens not detected by the test. Clinical correlation with patient history and other diagnostic information is necessary to determine patient infection status. The expected result is Negative.  Fact Sheet for Patients: EntrepreneurPulse.com.au  Fact Sheet for Healthcare Providers: IncredibleEmployment.be  This test is not yet approved or cleared by the [place] FDA and  has been authorized for detection and/or diagnosis of SARS-CoV-2 by FDA under an Emergency Use Authorization (EUA).  This EUA will remain in effect (meaning this test can be  used) for the duration of  the COVID-19 declaration under Section 564(b)(1) of the Act, 21 U.S.C. section 360bbb-3(b)(1), unless the authorization is terminated or revoked sooner.     Influenza A by PCR NEGATIVE NEGATIVE Final   Influenza B by PCR NEGATIVE NEGATIVE Final    Comment: (NOTE) The Xpert Xpress SARS-CoV-2/FLU/RSV plus assay is intended as an aid in the diagnosis of influenza from Nasopharyngeal swab specimens and should not be used as a sole basis for treatment. Nasal washings and aspirates are unacceptable for Xpert Xpress SARS-CoV-2/FLU/RSV testing.  Fact Sheet for Patients: EntrepreneurPulse.com.au  Fact Sheet for Healthcare Providers: IncredibleEmployment.be  This test is not yet approved or cleared by the [place] FDA and has been authorized for detection and/or diagnosis of SARS-CoV-2 by FDA under an Emergency Use Authorization (EUA). This EUA will remain in effect (meaning this test can be used) for the duration of the COVID-19 declaration under Section 564(b)(1) of the Act, 21 U.S.C. section 360bbb-3(b)(1), unless the authorization is terminated or revoked.  Performed at [facility],  [address] [place], [place] [address]   Urine culture     Status: None   Collection Time: 08/10/20  4:33 AM   Specimen: In/Out Cath Urine  Result Value Ref Range Status   Specimen Description   Final    IN/OUT CATH URINE Performed at [facility], [address] [place], [place] [address]    Special Requests   Final    NONE Performed at [facility], [address] [place], [place] [address]    Culture   Final    NO GROWTH Performed at [facility], [place] [address], [place], [place] [address]    Report Status 08/11/2020 FINAL  Final  MRSA PCR Screening     Status: None   Collection Time: 08/10/20 12:40 PM   Specimen: Nasopharyngeal  Result Value Ref Range Status   MRSA by PCR NEGATIVE NEGATIVE Final    Comment:        The GeneXpert MRSA Assay (FDA approved for NASAL specimens only), is one component of a comprehensive MRSA colonization surveillance program. It is not intended to diagnose MRSA infection nor to guide or monitor treatment for MRSA infections. Performed at [facility], [address] [place], [place] [address]   CULTURE, BLOOD (ROUTINE X 2) w Reflex to ID Panel     Status: None (Preliminary result)   Collection Time: 08/15/20  1:14 PM   Specimen: BLOOD  Result Value Ref Range Status   Specimen Description  BLOOD BLOOD LEFT HAND  Final   Special Requests   Final    BOTTLES DRAWN AEROBIC AND ANAEROBIC Blood Culture adequate volume   Culture   Final    NO GROWTH 2 DAYS Performed at [facility], [address] [place], [place] [address]    Report Status PENDING  Incomplete  Culture, respiratory (non-expectorated)     Status: None (Preliminary result)   Collection Time: 08/15/20  2:00 PM   Specimen: Tracheal Aspirate; Respiratory  Result Value Ref Range Status   Specimen Description   Final    TRACHEAL ASPIRATE Performed at [facility], [address] [place], [place] [address]     Special Requests   Final    NONE Performed at [facility], [address] [place], [place] [address]    Gram Stain   Final    FEW WBC PRESENT, PREDOMINANTLY PMN FEW GRAM POSITIVE COCCI IN CLUSTERS IN CHAINS FEW YEAST    Culture   Final    CULTURE REINCUBATED FOR BETTER GROWTH Performed at [facility], [place] [address] [place], [place] [address]    Report Status PENDING  Incomplete  CULTURE, BLOOD (ROUTINE X 2) w Reflex to ID Panel     Status: None (Preliminary result)   Collection Time: 08/15/20  2:34 PM   Specimen: BLOOD  Result Value Ref Range Status   Specimen Description BLOOD BLOOD LEFT HAND  Final   Special Requests   Final    BOTTLES DRAWN AEROBIC AND ANAEROBIC Blood Culture adequate volume   Culture   Final    NO GROWTH 2 DAYS Performed at [facility], [address], [place], [place] [address]    Report Status PENDING  Incomplete    Anti-infectives:  Anti-infectives (From admission, onward)   Start     Dose/Rate Route Frequency Ordered Stop   08/16/20 1800  linezolid (ZYVOX) IVPB 600 mg        600 mg 300 mL/hr over 60 Minutes Intravenous Every 12 hours 08/16/20 1357     08/15/20 1400  piperacillin-tazobactam (ZOSYN) IVPB 3.375 g        3.375 g 12.5 mL/hr over 240 Minutes Intravenous Every 8 hours 08/15/20 1105     08/15/20 1200  vancomycin (VANCOREADY) IVPB 2000 mg/400 mL        2,000 mg 200 mL/hr over 120 Minutes Intravenous  Once 08/15/20 1105 08/15/20 1411   08/13/20 1400  remdesivir 100 mg in sodium chloride 0.9 % 100 mL IVPB        100 mg 200 mL/hr over 30 Minutes Intravenous  Once 08/13/20 1248 08/13/20 1731   08/11/20 0100  cefTRIAXone (ROCEPHIN) 2 g in sodium chloride 0.9 % 100 mL IVPB  Status:  Discontinued        2 g 200 mL/hr over 30 Minutes Intravenous Every 24 hours 08/10/20 0859 08/15/20 1105   08/10/20 1000  remdesivir 100 mg in sodium chloride 0.9 % 100 mL IVPB       "Followed by" Linked Group Details   100 mg 200  mL/hr over 30 Minutes Intravenous Daily 08/09/20 2236 08/14/20 0959   08/10/20 0000  cefTRIAXone (ROCEPHIN) 2 g in sodium chloride 0.9 % 100 mL IVPB        2 g 200 mL/hr over 30 Minutes Intravenous  Once 08/09/20 2352 08/10/20 0125   08/09/20 2345  levofloxacin (LEVAQUIN) IVPB 750 mg  Status:  Discontinued        750  mg 100 mL/hr over 90 Minutes Intravenous  Once 08/09/20 2339 08/09/20 2351   08/09/20 2300  remdesivir 200 mg in sodium chloride 0.9% 250 mL IVPB       "Followed by" Linked Group Details   200 mg 580 mL/hr over 30 Minutes Intravenous Once 08/09/20 2236 08/10/20 0201   08/09/20 2300  azithromycin (ZITHROMAX) 500 mg in sodium chloride 0.9 % 250 mL IVPB        500 mg 250 mL/hr over 60 Minutes Intravenous Every 24 hours 08/10/20 0859 08/12/20 2259   08/09/20 2230  azithromycin (ZITHROMAX) 500 mg in sodium chloride 0.9 % 250 mL IVPB        500 mg 250 mL/hr over 60 Minutes Intravenous  Once 08/09/20 2227 08/10/20 0038        PAST MEDICAL HISTORY   Past Medical History:  Diagnosis Date  . Gout   . Hypertension   . Rupture of bowel [facility])      SURGICAL HISTORY   Past Surgical History:  Procedure Laterality Date  . COLONOSCOPY WITH PROPOFOL N/A 05/18/2020   Procedure: COLONOSCOPY WITH PROPOFOL;  Surgeon: Benjamine Sprague, DO;  Location: [facility] ENDOSCOPY;  Service: General;  Laterality: N/A;  . COLOSTOMY N/A 11/22/2019   Procedure: COLOSTOMY;  Surgeon: Herbert Pun, MD;  Location: [facility] ORS;  Service: General;  Laterality: N/A;  . LAPAROTOMY N/A 11/22/2019   Procedure: EXPLORATORY LAPAROTOMY;  Surgeon: Herbert Pun, MD;  Location: [facility] ORS;  Service: General;  Laterality: N/A;  . LAPAROTOMY N/A 07/08/2020   Procedure: EXPLORATORY LAPAROTOMY;  Surgeon: Herbert Pun, MD;  Location: [facility] ORS;  Service: General;  Laterality: N/A;  . PARTIAL COLECTOMY N/A 11/22/2019   Procedure: PARTIAL COLECTOMY;  Surgeon: Herbert Pun, MD;  Location: [facility] ORS;   Service: General;  Laterality: N/A;  . XI ROBOTIC ASSISTED COLOSTOMY TAKEDOWN N/A 06/28/2020   Procedure: XI ROBOTIC ASSISTED COLOSTOMY TAKEDOWN CONVERTED TO OPEN PROCEDURE;  Surgeon: Herbert Pun, MD;  Location: [facility] ORS;  Service: General;  Laterality: N/A;     FAMILY HISTORY   Family History  Problem Relation Age of Onset  . Healthy Mother   . Healthy Father      SOCIAL HISTORY   Social History   Tobacco Use  . Smoking status: Never Smoker  . Smokeless tobacco: Current User    Types: Chew  Vaping Use  . Vaping Use: Never used  Substance Use Topics  . Alcohol use: Yes    Comment: pint to a fifth per day per patient . Patient reports he hasn't had anything to drink in three weeks.  . Drug use: Never     MEDICATIONS   Current Medication:  Current Facility-Administered Medications:  .  0.9 %  sodium chloride infusion, 250 mL, Intravenous, PRN, Opyd, Ilene Qua, MD, Stopped at 08/13/20 1132 .  acetaminophen (TYLENOL) tablet 650 mg, 650 mg, Per Tube, Q4H PRN, Rust-Chester, Britton L, NP, 650 mg at 08/17/20 0811 .  albuterol (VENTOLIN HFA) 108 (90 Base) MCG/ACT inhaler 2 puff, 2 puff, Inhalation, Q6H PRN, Opyd, Timothy S, MD .  allopurinol (ZYLOPRIM) tablet 300 mg, 300 mg, Per Tube, Daily, Lorella Nimrod, MD, 300 mg at 08/17/20 0811 .  chlorhexidine gluconate (MEDLINE KIT) (PERIDEX) 0.12 % solution 15 mL, 15 mL, Mouth Rinse, BID, Lanney Gins, Fuad, MD, 15 mL at 08/17/20 0812 .  Chlorhexidine Gluconate Cloth 2 % PADS 6 each, 6 each, Topical, Daily, Ottie Glazier, MD, 6 each at 08/17/20 1400 .  chlorpheniramine-HYDROcodone (TUSSIONEX) 10-8 MG/5ML suspension 5  mL, 5 mL, Per Tube, Q12H PRN, Lorella Nimrod, MD .  cloNIDine (CATAPRES) tablet 0.3 mg, 0.3 mg, Oral, Q6H, Tyna Jaksch, MD, 0.3 mg at 08/17/20 1601 .  dexamethasone (DECADRON) injection 6 mg, 6 mg, Intravenous, Q24H, Lanney Gins, Fuad, MD, 6 mg at 08/16/20 2100 .  dexmedetomidine (PRECEDEX) 400 MCG/100ML (4  mcg/mL) infusion, 0.4-1.2 mcg/kg/hr, Intravenous, Titrated, Aleskerov, Fuad, MD, Last Rate: 11.79 mL/hr at 08/17/20 1618, 0.4 mcg/kg/hr at 08/17/20 1618 .  enoxaparin (LOVENOX) injection 60 mg, 0.5 mg/kg, Subcutaneous, Q2200, Opyd, Ilene Qua, MD, 60 mg at 08/16/20 2115 .  feeding supplement (PROSource TF) liquid 90 mL, 90 mL, Per Tube, QID, Tyna Jaksch, MD, 90 mL at 08/17/20 1602 .  feeding supplement (VITAL 1.5 CAL) liquid 1,000 mL, 1,000 mL, Per Tube, Continuous, Tyna Jaksch, MD, Last Rate: 45 mL/hr at 08/17/20 1233, 1,000 mL at 08/17/20 1233 .  fentaNYL (SUBLIMAZE) bolus via infusion 50-100 mcg, 50-100 mcg, Intravenous, Q2H PRN, Rust-Chester, Britton L, NP, 50 mcg at 08/16/20 1116 .  fentaNYL 2530mg in NS 2541m(104mml) infusion-PREMIX, 0-400 mcg/hr, Intravenous, Continuous, Aleskerov, Fuad, MD, Last Rate: 30 mL/hr at 08/17/20 1618, 300 mcg/hr at 08/17/20 1618 .  furosemide (LASIX) injection 40 mg, 40 mg, Intravenous, BID, IzqTyna JakschD, 40 mg at 08/17/20 1234 .  guaiFENesin-dextromethorphan (ROBITUSSIN DM) 100-10 MG/5ML syrup 10 mL, 10 mL, Per Tube, Q4H PRN, AmiLorella NimrodD .  HYDROcodone-acetaminophen (NORCO/VICODIN) 5-325 MG per tablet 1-2 tablet, 1-2 tablet, Per Tube, Q4H PRN, Amin, Sumayya, MD .  insulin aspart (novoLOG) injection 0-20 Units, 0-20 Units, Subcutaneous, Q4H, KeeDarel Hong NP, 7 Units at 08/17/20 1600 .  insulin aspart (novoLOG) injection 4 Units, 4 Units, Subcutaneous, Q4H, KeeDarel Hong NP, 4 Units at 08/17/20 1600 .  linezolid (ZYVOX) IVPB 600 mg, 600 mg, Intravenous, Q12H, IzqTyna JakschD, Stopped at 08/17/20 1134 .  MEDLINE mouth rinse, 15 mL, Mouth Rinse, 10 times per day, AleOttie GlazierD, 15 mL at 08/17/20 1605 .  midazolam (VERSED) 50 mg/50 mL (1 mg/mL) premix infusion, 0.5-10 mg/hr, Intravenous, Continuous, Aleskerov, Fuad, MD, Last Rate: 10 mL/hr at 08/17/20 1618, 10 mg/hr at 08/17/20 1618 .  midazolam (VERSED)  injection 2 mg, 2 mg, Intravenous, Q2H PRN, AleOttie GlazierD, 2 mg at 08/14/20 1709 .  ondansetron (ZOFRAN) tablet 4 mg, 4 mg, Oral, Q6H PRN **OR** ondansetron (ZOFRAN) injection 4 mg, 4 mg, Intravenous, Q6H PRN, Opyd, Timothy S, MD .  pantoprazole sodium (PROTONIX) 40 mg/20 mL oral suspension 40 mg, 40 mg, Per Tube, Q2200, AleOttie GlazierD, 40 mg at 08/16/20 2115 .  phosphorus (K PHOS NEUTRAL) tablet 500 mg, 500 mg, Per Tube, TID, IzqTyna JakschD, 500 mg at 08/17/20 1611 .  piperacillin-tazobactam (ZOSYN) IVPB 3.375 g, 3.375 g, Intravenous, Q8H, IzqTyna JakschD, Last Rate: 12.5 mL/hr at 08/17/20 1618, Infusion Verify at 08/17/20 1618 .  sennosides (SENOKOT) 8.8 MG/5ML syrup 10 mL, 10 mL, Per Tube, BID, GruDallie PilesPH, 10 mL at 08/16/20 2115 .  sodium chloride flush (NS) 0.9 % injection 10-40 mL, 10-40 mL, Intracatheter, Q12H, AmiLorella NimrodD, 20 mL at 08/17/20 1035 .  sodium chloride flush (NS) 0.9 % injection 10-40 mL, 10-40 mL, Intracatheter, PRN, AmiLorella NimrodD .  vecuronium (NORCURON) injection 10 mg, 10 mg, Intravenous, Q1H PRN, AleOttie GlazierD, 10 mg at 08/17/20 0459    ALLERGIES   Amoxicillin    REVIEW OF SYSTEMS     Unable to obtain  due to sedation on precedex drip  PHYSICAL EXAMINATION   Vital Signs: Temp:  [97.88 F (36.6 C)-100.6 F (38.1 C)] 97.88 F (36.6 C) (12/29 1600) Pulse Rate:  [64-80] 72 (12/29 1600) Resp:  [16-43] 23 (12/29 1600) BP: (136-179)/(64-89) 140/67 (12/29 1600) SpO2:  [91 %-98 %] 92 % (12/29 1600) FiO2 (%):  [45 %-65 %] 55 % (12/29 1010)  GENERAL:age appropriate sedated HEAD: Normocephalic, atraumatic.  EYES: Pupils equal, round, reactive to light.  No scleral icterus. ETT+ MOUTH: dryt mucosal membrane. NECK: Supple. No thyromegaly. No nodules. No JVD.  PULMONARY: ronchi bilaterally  CARDIOVASCULAR: S1 and S2. Regular rate and rhythm. No murmurs, rubs, or gallops.  GASTROINTESTINAL: Soft, nontender,  non-distended. No masses. Positive bowel sounds. No hepatosplenomegaly.  MUSCULOSKELETAL: No swelling, clubbing, or edema.  NEUROLOGIC: GCS4T SKIN:intact,warm,dry   PERTINENT DATA     Infusions: . sodium chloride Stopped (08/13/20 1132)  . dexmedetomidine (PRECEDEX) IV infusion 0.4 mcg/kg/hr (08/17/20 1618)  . feeding supplement (VITAL 1.5 CAL) 1,000 mL (08/17/20 1233)  . fentaNYL infusion INTRAVENOUS 300 mcg/hr (08/17/20 1618)  . linezolid (ZYVOX) IV Stopped (08/17/20 1134)  . midazolam 10 mg/hr (08/17/20 1618)  . piperacillin-tazobactam (ZOSYN)  IV 12.5 mL/hr at 08/17/20 1618   Scheduled Medications: . allopurinol  300 mg Per Tube Daily  . chlorhexidine gluconate (MEDLINE KIT)  15 mL Mouth Rinse BID  . Chlorhexidine Gluconate Cloth  6 each Topical Daily  . cloNIDine  0.3 mg Oral Q6H  . dexamethasone (DECADRON) injection  6 mg Intravenous Q24H  . enoxaparin (LOVENOX) injection  0.5 mg/kg Subcutaneous Q2200  . feeding supplement (PROSource TF)  90 mL Per Tube QID  . furosemide  40 mg Intravenous BID  . insulin aspart  0-20 Units Subcutaneous Q4H  . insulin aspart  4 Units Subcutaneous Q4H  . mouth rinse  15 mL Mouth Rinse 10 times per day  . pantoprazole sodium  40 mg Per Tube Q2200  . phosphorus  500 mg Per Tube TID  . sennosides  10 mL Per Tube BID  . sodium chloride flush  10-40 mL Intracatheter Q12H   PRN Medications: sodium chloride, acetaminophen, albuterol, chlorpheniramine-HYDROcodone, fentaNYL, guaiFENesin-dextromethorphan, HYDROcodone-acetaminophen, midazolam, ondansetron **OR** ondansetron (ZOFRAN) IV, sodium chloride flush, vecuronium Hemodynamic parameters:   Intake/Output: 12/28 0701 - 12/29 0700 In: 4075.4 [I.V.:1559.7; NG/GT:2059; IV Piggyback:456.8] Out: 6010 [Urine:3890; Stool:500]  Ventilator  Settings: Vent Mode: PRVC FiO2 (%):  [45 %-65 %] 55 % Set Rate:  [24 bmp] 24 bmp Vt Set:  [500 mL] 500 mL PEEP:  [12 cmH20] 12 cmH20 Plateau Pressure:  [21  cmH20-22 cmH20] 22 cmH20    LAB RESULTS:  Basic Metabolic Panel: Recent Labs  Lab 08/13/20 0314 08/14/20 0539 08/15/20 0508 08/16/20 0749 08/17/20 0719  NA 145 145 143 144 145  K 4.3 4.4 4.1 4.6 4.0  CL 108 107 107 104 100  CO2 '27 31 31 31 ' 37*  GLUCOSE 200* 189* 207* 212* 245*  BUN 37* 25* 23* 16 21*  CREATININE 0.60* 0.51* 0.61 0.43* 0.40*  CALCIUM 8.6* 8.6* 8.3* 8.4* 8.9  MG 2.6* 2.4 2.1 2.3 2.1  PHOS 3.3 2.7 3.2 2.0* 1.8*   Liver Function Tests: Recent Labs  Lab 08/11/20 0636 08/12/20 0409 08/13/20 0314 08/14/20 0539  AST '28 23 19 17  ' ALT '24 21 19 17  ' ALKPHOS 46 40 37* 34*  BILITOT 0.7 0.5 0.6 0.7  PROT 6.8 6.6 6.3* 6.1*  ALBUMIN 2.9* 2.8* 2.7* 2.8*   No results for input(s): LIPASE,  AMYLASE in the last 168 hours. No results for input(s): AMMONIA in the last 168 hours. CBC: Recent Labs  Lab 08/11/20 0636 08/12/20 0409 08/13/20 0314 08/14/20 0539 08/15/20 0508 08/16/20 0749  WBC 7.6 6.2 5.8 7.4 7.9 9.5  NEUTROABS 6.7 5.4 5.0 6.8  --   --   HGB 11.8* 10.4* 9.8* 9.8* 9.0* 10.2*  HCT 36.9* 34.3* 32.8* 31.7* 30.5* 33.7*  MCV 91.6 96.1 97.3 96.6 97.8 97.1  PLT 133* 151 145* 152 147* 164   Cardiac Enzymes: No results for input(s): CKTOTAL, CKMB, CKMBINDEX, TROPONINI in the last 168 hours. BNP: Invalid input(s): POCBNP CBG: Recent Labs  Lab 08/17/20 0003 08/17/20 0454 08/17/20 0800 08/17/20 1107 08/17/20 1600  GLUCAP 245* 252* 236* 261* 244*       IMAGING RESULTS:  Imaging: ECHOCARDIOGRAM COMPLETE  Result Date: 08/17/2020    ECHOCARDIOGRAM REPORT   Patient Name:   HABIB KISE Date of Exam: 08/17/2020 Medical Rec #:  [id number]             Height:       72.0 in Accession #:    [id number]            Weight:       259.9 lb Date of Birth:  [birth year]              BSA:          2.382 m Patient Age:    29 years              BP:           154/79 mmHg Patient Gender: M                     HR:           76 bpm. Exam Location:  [facility] Procedure:  2D Echo, Cardiac Doppler and Color Doppler Indications:     Adult respiratory distress syndrome  History:         Patient has no prior history of Echocardiogram examinations.                  Risk Factors:Hypertension.  Sonographer:     Sherrie Sport RDCS (AE) Referring Phys:  [id number] Tyna Jaksch Diagnosing Phys: Bartholome Bill MD  Sonographer Comments: Echo performed with patient supine and on artificial respirator and Technically difficult study due to poor echo windows. IMPRESSIONS  1. Left ventricular ejection fraction, by estimation, is 70 to 75%. The left ventricle has hyperdynamic function. The left ventricle has no regional wall motion abnormalities. Left ventricular diastolic parameters were normal.  2. Right ventricular systolic function is normal. The right ventricular size is mildly enlarged.  3. The mitral valve was not well visualized. Trivial mitral valve regurgitation.  4. The aortic valve was not well visualized. Aortic valve regurgitation is not visualized. FINDINGS  Left Ventricle: Left ventricular ejection fraction, by estimation, is 70 to 75%. The left ventricle has hyperdynamic function. The left ventricle has no regional wall motion abnormalities. The left ventricular internal cavity size was normal in size. There is no left ventricular hypertrophy. Left ventricular diastolic parameters were normal. Right Ventricle: The right ventricular size is mildly enlarged. No increase in right ventricular wall thickness. Right ventricular systolic function is normal. Left Atrium: Left atrial size was normal in size. Right Atrium: Right atrial size was normal in size. Pericardium: There is no evidence of pericardial effusion. Mitral Valve: The mitral valve was not well visualized.  Trivial mitral valve regurgitation. Tricuspid Valve: The tricuspid valve is not well visualized. Tricuspid valve regurgitation is trivial. Aortic Valve: The aortic valve was not well visualized. Aortic valve regurgitation is  not visualized. Pulmonic Valve: The pulmonic valve was not assessed. Pulmonic valve regurgitation is not visualized. Aorta: The aortic root is normal in size and structure. IAS/Shunts: The interatrial septum was not assessed.  LEFT VENTRICLE PLAX 2D LVIDd:         4.34 cm LVIDs:         2.32 cm LV PW:         1.30 cm LV IVS:        1.44 cm LVOT diam:     2.20 cm LVOT Area:     3.80 cm  LEFT ATRIUM         Index LA diam:    3.30 cm 1.39 cm/m   AORTA Ao Root diam: 3.20 cm  SHUNTS Systemic Diam: 2.20 cm Bartholome Bill MD Electronically signed by Bartholome Bill MD Signature Date/Time: 08/17/2020/12:20:12 PM    Final    '@PROBHOSP' @ ECHOCARDIOGRAM COMPLETE  Result Date: 08/17/2020    ECHOCARDIOGRAM REPORT   Patient Name:   WEBB WEED Date of Exam: 08/17/2020 Medical Rec #:  [id number]             Height:       72.0 in Accession #:    [id number]            Weight:       259.9 lb Date of Birth:  27-Feb-1991              BSA:          2.382 m Patient Age:    29 years              BP:           154/79 mmHg Patient Gender: M                     HR:           76 bpm. Exam Location:  [facility] Procedure: 2D Echo, Cardiac Doppler and Color Doppler Indications:     Adult respiratory distress syndrome  History:         Patient has no prior history of Echocardiogram examinations.                  Risk Factors:Hypertension.  Sonographer:     Sherrie Sport RDCS (AE) Referring Phys:  [id number] Tyna Jaksch Diagnosing Phys: Bartholome Bill MD  Sonographer Comments: Echo performed with patient supine and on artificial respirator and Technically difficult study due to poor echo windows. IMPRESSIONS  1. Left ventricular ejection fraction, by estimation, is 70 to 75%. The left ventricle has hyperdynamic function. The left ventricle has no regional wall motion abnormalities. Left ventricular diastolic parameters were normal.  2. Right ventricular systolic function is normal. The right ventricular size is mildly enlarged.  3. The mitral  valve was not well visualized. Trivial mitral valve regurgitation.  4. The aortic valve was not well visualized. Aortic valve regurgitation is not visualized. FINDINGS  Left Ventricle: Left ventricular ejection fraction, by estimation, is 70 to 75%. The left ventricle has hyperdynamic function. The left ventricle has no regional wall motion abnormalities. The left ventricular internal cavity size was normal in size. There is no left ventricular hypertrophy. Left ventricular diastolic parameters were normal. Right Ventricle: The right ventricular size is mildly  enlarged. No increase in right ventricular wall thickness. Right ventricular systolic function is normal. Left Atrium: Left atrial size was normal in size. Right Atrium: Right atrial size was normal in size. Pericardium: There is no evidence of pericardial effusion. Mitral Valve: The mitral valve was not well visualized. Trivial mitral valve regurgitation. Tricuspid Valve: The tricuspid valve is not well visualized. Tricuspid valve regurgitation is trivial. Aortic Valve: The aortic valve was not well visualized. Aortic valve regurgitation is not visualized. Pulmonic Valve: The pulmonic valve was not assessed. Pulmonic valve regurgitation is not visualized. Aorta: The aortic root is normal in size and structure. IAS/Shunts: The interatrial septum was not assessed.  LEFT VENTRICLE PLAX 2D LVIDd:         4.34 cm LVIDs:         2.32 cm LV PW:         1.30 cm LV IVS:        1.44 cm LVOT diam:     2.20 cm LVOT Area:     3.80 cm  LEFT ATRIUM         Index LA diam:    3.30 cm 1.39 cm/m   AORTA Ao Root diam: 3.20 cm  SHUNTS Systemic Diam: 2.20 cm Bartholome Bill MD Electronically signed by Bartholome Bill MD Signature Date/Time: 08/17/2020/12:20:12 PM    Final      ASSESSMENT AND PLAN    -Multidisciplinary rounds held today  Acute Hypoxic Respiratory Failure Acute COVID19 pneumonia with severe ARDS -Remdesevir antiviral - pharmacy protocol 5 d -vitamin  C -zinc -decadron 54m IV daily  -Diuresis - Lasix 40 IV daily - monitor UOP - -d/c hepatotoxic medications while on remdesevir -baricitinib per covid protocol -supportive care with ICU telemetry monitoring -PT/OT when possible -procalcitonin, CRP and ferritin trending -baricitinib per protocol -Attain echo. If right heart strain will start heparin empirically for PE.  -Broad spectrum abx for potential VAP.  As patient has responded so robustly to diuretic administration and antibiotics will plan to treat for a course of 5 days for VAP        Perforated diverticulitis   - s/p colostomy     - may present problem with proning protocol for COVID    - reviewed care plan and okay to prone with this ICU telemetry  Monitoring   ID -continue IV abx as prescibed -follow up cultures  GI/Nutrition GI PROPHYLAXIS as indicated DIET-->TF's as tolerated Constipation protocol as indicated  ENDO - ICU hypoglycemic\Hyperglycemia protocol -check FSBS per protocol   ELECTROLYTES -follow labs as needed -replace as needed -pharmacy consultation   DVT/GI PRX ordered -SCDs  TRANSFUSIONS AS NEEDED MONITOR FSBS AS SESS the need for LABS as needed   Critical care provider statement:    Critical care time (minutes):  35   Critical care time was exclusive of:  Separately billable procedures and treating other patients   Critical care was necessary to treat or prevent imminent or life-threatening deterioration of the following conditions:  severe acute hypoxemic respiratory failure, severe ARDS, perforated diverticulitis.    Critical care was time spent personally by me on the following activities:  Development of treatment plan with patient or surrogate, discussions with consultants, evaluation of patient's response to treatment, examination of patient, obtaining history from patient or surrogate, ordering and performing treatments and interventions, ordering and review of laboratory  studies and re-evaluation of patient's condition.  I assumed direction of critical care for this patient from another provider in my specialty: no

## 2020-08-17 NOTE — Progress Notes (Signed)
Inpatient Diabetes Program Recommendations  AACE/ADA: New Consensus Statement on Inpatient Glycemic Control   Target Ranges:  Prepandial:   less than 140 mg/dL      Peak postprandial:   less than 180 mg/dL (1-2 hours)      Critically ill patients:  140 - 180 mg/dL   Results for Craig [name], Craig [name] (MRN [id number]) as of 08/17/2020 07:35  Ref. Range 08/16/2020 07:44 08/16/2020 11:28 08/16/2020 16:32 08/16/2020 20:10 08/17/2020 00:03 08/17/2020 04:54  Glucose-Capillary Latest Ref Range: 70 - 99 mg/dL 470 (H) 929 (H) 574 (H) 301 (H) 245 (H) 252 (H)   Review of Glycemic Control  Diabetes history: No Outpatient Diabetes medications: NA Current orders for Inpatient glycemic control: Novolog 0-20 units Q4H; Decadron 6 mg Q24H, Vital @ 45 ml/hr  Inpatient Diabetes Program Recommendations:    Insulin: If steroids and tube feeding are continued as ordered, please consider ordering Novolog 4 units Q4H for tube feeding coverage.  Thanks, Orlando Penner, RN, MSN, CDE Diabetes Coordinator Inpatient Diabetes Program [phone] (Team Pager from 8am to 5pm)

## 2020-08-17 NOTE — Progress Notes (Signed)
*  PRELIMINARY RESULTS* Echocardiogram 2D Echocardiogram has been performed.  Cristela Blue 08/17/2020, 9:09 AM

## 2020-08-18 DIAGNOSIS — J9601 Acute respiratory failure with hypoxia: Secondary | ICD-10-CM | POA: Diagnosis not present

## 2020-08-18 DIAGNOSIS — U071 COVID-19: Secondary | ICD-10-CM | POA: Diagnosis not present

## 2020-08-18 LAB — CBC WITH DIFFERENTIAL/PLATELET
Abs Immature Granulocytes: 0.07 10*3/uL (ref 0.00–0.07)
Basophils Absolute: 0 10*3/uL (ref 0.0–0.1)
Basophils Relative: 0 %
Eosinophils Absolute: 0 10*3/uL (ref 0.0–0.5)
Eosinophils Relative: 0 %
HCT: 32.1 % — ABNORMAL LOW (ref 39.0–52.0)
Hemoglobin: 9.7 g/dL — ABNORMAL LOW (ref 13.0–17.0)
Immature Granulocytes: 1 %
Lymphocytes Relative: 4 %
Lymphs Abs: 0.4 10*3/uL — ABNORMAL LOW (ref 0.7–4.0)
MCH: 28.9 pg (ref 26.0–34.0)
MCHC: 30.2 g/dL (ref 30.0–36.0)
MCV: 95.5 fL (ref 80.0–100.0)
Monocytes Absolute: 0.4 10*3/uL (ref 0.1–1.0)
Monocytes Relative: 4 %
Neutro Abs: 7.5 10*3/uL (ref 1.7–7.7)
Neutrophils Relative %: 91 %
Platelets: 205 10*3/uL (ref 150–400)
RBC: 3.36 MIL/uL — ABNORMAL LOW (ref 4.22–5.81)
RDW: 14.7 % (ref 11.5–15.5)
WBC: 8.3 10*3/uL (ref 4.0–10.5)
nRBC: 0 % (ref 0.0–0.2)

## 2020-08-18 LAB — BASIC METABOLIC PANEL
Anion gap: 9 (ref 5–15)
BUN: 33 mg/dL — ABNORMAL HIGH (ref 6–20)
CO2: 39 mmol/L — ABNORMAL HIGH (ref 22–32)
Calcium: 8.9 mg/dL (ref 8.9–10.3)
Chloride: 98 mmol/L (ref 98–111)
Creatinine, Ser: 0.63 mg/dL (ref 0.61–1.24)
GFR, Estimated: >60 mL/min (ref >60)
Glucose, Bld: 254 mg/dL — ABNORMAL HIGH (ref 70–99)
Potassium: 3.9 mmol/L (ref 3.5–5.1)
Sodium: 146 mmol/L — ABNORMAL HIGH (ref 135–145)

## 2020-08-18 LAB — PHOSPHORUS: Phosphorus: 3.5 mg/dL (ref 2.5–4.6)

## 2020-08-18 LAB — BLOOD GAS, ARTERIAL
Acid-Base Excess: 18.6 mmol/L — ABNORMAL HIGH (ref 0.0–2.0)
Bicarbonate: 44.5 mmol/L — ABNORMAL HIGH (ref 20.0–28.0)
FIO2: 0.5
MECHVT: 500 mL
O2 Saturation: 92.1 %
PEEP: 10 cmH2O
Patient temperature: 37
RATE: 24 resp/min
pCO2 arterial: 57 mmHg — ABNORMAL HIGH (ref 32.0–48.0)
pH, Arterial: 7.5 — ABNORMAL HIGH (ref 7.350–7.450)
pO2, Arterial: 58 mmHg — ABNORMAL LOW (ref 83.0–108.0)

## 2020-08-18 LAB — GLUCOSE, CAPILLARY
Glucose-Capillary: 202 mg/dL — ABNORMAL HIGH (ref 70–99)
Glucose-Capillary: 255 mg/dL — ABNORMAL HIGH (ref 70–99)
Glucose-Capillary: 285 mg/dL — ABNORMAL HIGH (ref 70–99)
Glucose-Capillary: 157 mg/dL — ABNORMAL HIGH (ref 70–99)
Glucose-Capillary: 118 mg/dL — ABNORMAL HIGH (ref 70–99)
Glucose-Capillary: 259 mg/dL — ABNORMAL HIGH (ref 70–99)

## 2020-08-18 LAB — MAGNESIUM: Magnesium: 2 mg/dL (ref 1.7–2.4)

## 2020-08-18 LAB — CULTURE, RESPIRATORY W GRAM STAIN: Culture: NORMAL

## 2020-08-18 MED ORDER — CLONAZEPAM 0.5 MG PO TBDP
2.0000 mg | ORAL_TABLET | Freq: Two times a day (BID) | ORAL | Status: AC
  Administered 2020-08-18 – 2020-08-19 (×3): 2 mg
  Filled 2020-08-18 (×3): qty 8

## 2020-08-18 MED ORDER — FREE WATER
200.0000 mL | Status: DC
  Administered 2020-08-18 – 2020-09-05 (×109): 200 mL

## 2020-08-18 MED ORDER — CLONAZEPAM 0.5 MG PO TBDP: 2.0000 mg | ORAL_TABLET | Freq: Two times a day (BID) | ORAL | Status: DC

## 2020-08-18 MED ORDER — CLONIDINE HCL 0.1 MG PO TABS
0.3000 mg | ORAL_TABLET | Freq: Four times a day (QID) | ORAL | Status: DC
  Administered 2020-08-18 – 2020-08-20 (×4): 0.3 mg
  Filled 2020-08-18 (×4): qty 3

## 2020-08-18 MED ORDER — ACETAZOLAMIDE SODIUM 500 MG IJ SOLR
500.0000 mg | Freq: Once | INTRAMUSCULAR | Status: AC
  Administered 2020-08-18: 500 mg via INTRAVENOUS
  Filled 2020-08-18: qty 500

## 2020-08-18 MED ORDER — STERILE WATER FOR INJECTION IJ SOLN
INTRAMUSCULAR | Status: AC
  Administered 2020-08-18: 5 mL
  Filled 2020-08-18: qty 10

## 2020-08-18 NOTE — Progress Notes (Signed)
CRITICAL CARE PROGRESS NOTE    Name: Craig [name] MRN: [id number] DOB: Dec 26, 1990  Referring physician: Dr Reesa Chew    LOS: [place]    Patient description:  29 yo male recent hospitalization for perforated diverticulitis s/p colostomy with reversal and leakage of anastomosis with s/p revision now acutely hypoxemic with COVID19 induced severe ARDS.    08/11/20-patient continued to decline with worsening respiratory status despite 100%Fio2 on BIPAP and HFNC failure.  He stated that he feels he is dying and cannot breathe.  I discussed with wife [name] earlier today that patient is critically ill may need ETT, she is agreeable and thankful for care. I specifically discussed and explained intubation and mechanical ventilation to patient and he wishes to proceed.    08/12/20- patient was weaned on FiO2 to 60%. Spoke to wife [name].   08/13/20- Patient weaned to 40%.  During SBT today he self extubated and was placed on HFNC.  He subsequently became hypoxemic, disoriented aggitated, removed PIVs and colostomy and proceeded to have combative behavior with worsening oxygenation. I have attempted to calm patient and encouraged him to cooperate.  After numerous attempts patient continued to be combative with severe aggitation with combative behavior and would not wear oxygen. Patient was placed on sedation and MV.  Wife updated.    08/14/20- patient remains critically ill.  Weaned from 100%>>55%. Called and updated wife [name] today  12/27-Patient with worsening oxygenation on FiO2 100% peep of 14   08/16/20-Patient with continued high oxygen requirements overnight. Have spoken with surgery and there should be no problem proning patient with this. Was febrile yesterday and thus  broad spectrum abx started and cultures attained.    08-17-20-patient down to PEEP 10 FiO2 55%.  Patient never required proning  08/18/20: FiO2 weaned to 40%, PEEP 10, plan for diuresis with Diamox today, adding PO Clonazepam to assist weaning sedation   Lines/tubes : Closed System Drain 1 Right Abdomen Bulb (JP) 19 Fr. (Active)     Colostomy LLQ (Active)  Stoma Assessment Pink;Red 08/10/20 0944  Peristomal Assessment Intact 08/10/20 0944    Microbiology/Sepsis markers: Results for orders placed or performed during the hospital encounter of 08/09/20  Culture, blood (Routine X 2) w Reflex to ID Panel     Status: None   Collection Time: 08/09/20  9:06 PM   Specimen: BLOOD  Result Value Ref Range Status   Specimen Description BLOOD RIGHT ARM  Final   Special Requests   Final    BOTTLES DRAWN AEROBIC AND ANAEROBIC Blood Culture adequate volume   Culture   Final    NO GROWTH 5 DAYS Performed at [facility], [address], [place], [place] [address]    Report Status 08/15/2020 FINAL  Final  Culture, blood (Routine X 2) w Reflex to ID Panel     Status: None   Collection Time: 08/09/20  9:06 PM   Specimen: BLOOD  Result Value Ref Range Status   Specimen Description BLOOD RIGHT [facility]  Final   Special Requests   Final    BOTTLES DRAWN AEROBIC AND ANAEROBIC Blood Culture adequate volume   Culture   Final    NO GROWTH 5 DAYS Performed at [facility], [address] [place], [place] [address]    Report Status 08/15/2020 FINAL  Final  Resp Panel by RT-PCR (Flu A&B, Covid) Nasopharyngeal Swab     Status: Abnormal   Collection Time: 08/09/20  9:11  PM   Specimen: Nasopharyngeal Swab; Nasopharyngeal(NP) swabs in vial transport medium  Result Value Ref Range Status   SARS Coronavirus 2 by RT PCR POSITIVE (A) NEGATIVE Final    Comment: RESULT CALLED TO, READ BACK BY AND VERIFIED WITH: JANE MARTIN '@[id number]'  ON 08/09/20 SKL (NOTE) SARS-CoV-2 target nucleic acids are  DETECTED.  The SARS-CoV-2 RNA is generally detectable in upper respiratory specimens during the acute phase of infection. Positive results are indicative of the presence of the identified virus, but do not rule out bacterial infection or co-infection with other pathogens not detected by the test. Clinical correlation with patient history and other diagnostic information is necessary to determine patient infection status. The expected result is Negative.  Fact Sheet for Patients: EntrepreneurPulse.com.au  Fact Sheet for Healthcare Providers: IncredibleEmployment.be  This test is not yet approved or cleared by the [place] FDA and  has been authorized for detection and/or diagnosis of SARS-CoV-2 by FDA under an Emergency Use Authorization (EUA).  This EUA will remain in effect (meaning this test can be  used) for the duration of  the COVID-19 declaration under Section 564(b)(1) of the Act, 21 U.S.C. section 360bbb-3(b)(1), unless the authorization is terminated or revoked sooner.     Influenza A by PCR NEGATIVE NEGATIVE Final   Influenza B by PCR NEGATIVE NEGATIVE Final    Comment: (NOTE) The Xpert Xpress SARS-CoV-2/FLU/RSV plus assay is intended as an aid in the diagnosis of influenza from Nasopharyngeal swab specimens and should not be used as a sole basis for treatment. Nasal washings and aspirates are unacceptable for Xpert Xpress SARS-CoV-2/FLU/RSV testing.  Fact Sheet for Patients: EntrepreneurPulse.com.au  Fact Sheet for Healthcare Providers: IncredibleEmployment.be  This test is not yet approved or cleared by the [place] FDA and has been authorized for detection and/or diagnosis of SARS-CoV-2 by FDA under an Emergency Use Authorization (EUA). This EUA will remain in effect (meaning this test can be used) for the duration of the COVID-19 declaration under Section 564(b)(1) of the Act, 21  U.S.C. section 360bbb-3(b)(1), unless the authorization is terminated or revoked.  Performed at [facility], [address] [place], [place] [address]   Urine culture     Status: None   Collection Time: 08/10/20  4:33 AM   Specimen: In/Out Cath Urine  Result Value Ref Range Status   Specimen Description   Final    IN/OUT CATH URINE Performed at [facility], [address], [place], [place] [address]    Special Requests   Final    NONE Performed at [facility], [address], [place], [place] [address]    Culture   Final    NO GROWTH Performed at [facility], [place] [address], [place], [place] [address]    Report Status 08/11/2020 FINAL  Final  MRSA PCR Screening     Status: None   Collection Time: 08/10/20 12:40 PM   Specimen: Nasopharyngeal  Result Value Ref Range Status   MRSA by PCR NEGATIVE NEGATIVE Final    Comment:        The GeneXpert MRSA Assay (FDA approved for NASAL specimens only), is one component of a comprehensive MRSA colonization surveillance program. It is not intended to diagnose MRSA infection nor to guide or monitor treatment for MRSA infections. Performed at [facility], [place],  [address]   CULTURE, BLOOD (ROUTINE X 2) w Reflex to ID Panel     Status: None (Preliminary result)  Collection Time: 08/15/20  1:14 PM   Specimen: BLOOD  Result Value Ref Range Status   Specimen Description BLOOD BLOOD LEFT HAND  Final   Special Requests   Final    BOTTLES DRAWN AEROBIC AND ANAEROBIC Blood Culture adequate volume   Culture   Final    NO GROWTH 3 DAYS Performed at [facility], [address] [place], [place] [address]    Report Status PENDING  Incomplete  Culture, respiratory (non-expectorated)     Status: None   Collection Time: 08/15/20  2:00 PM   Specimen: Tracheal Aspirate; Respiratory  Result Value Ref Range Status   Specimen Description   Final     TRACHEAL ASPIRATE Performed at [facility], [address], [place], [place] [address]    Special Requests   Final    NONE Performed at [facility], [address] [place], [place] [address]    Gram Stain   Final    FEW WBC PRESENT, PREDOMINANTLY PMN FEW GRAM POSITIVE COCCI IN CLUSTERS IN CHAINS FEW YEAST    Culture   Final    Consistent with normal respiratory flora. Performed at [facility], [place] [address] [place], [place] [address]    Report Status 08/18/2020 FINAL  Final  CULTURE, BLOOD (ROUTINE X 2) w Reflex to ID Panel     Status: None (Preliminary result)   Collection Time: 08/15/20  2:34 PM   Specimen: BLOOD  Result Value Ref Range Status   Specimen Description BLOOD BLOOD LEFT HAND  Final   Special Requests   Final    BOTTLES DRAWN AEROBIC AND ANAEROBIC Blood Culture adequate volume   Culture   Final    NO GROWTH 3 DAYS Performed at [facility], [address] [place],  [address]    Report Status PENDING  Incomplete    Anti-infectives:  Anti-infectives (From admission, onward)   Start     Dose/Rate Route Frequency Ordered Stop   08/16/20 1800  linezolid (ZYVOX) IVPB 600 mg        600 mg 300 mL/hr over 60 Minutes Intravenous Every 12 hours 08/16/20 1357 08/21/20 2159   08/15/20 1400  piperacillin-tazobactam (ZOSYN) IVPB 3.375 g        3.375 g 12.5 mL/hr over 240 Minutes Intravenous Every 8 hours 08/15/20 1105 08/20/20 1359   08/15/20 1200  vancomycin (VANCOREADY) IVPB 2000 mg/400 mL        2,000 mg 200 mL/hr over 120 Minutes Intravenous  Once 08/15/20 1105 08/15/20 1411   08/13/20 1400  remdesivir 100 mg in sodium chloride 0.9 % 100 mL IVPB        100 mg 200 mL/hr over 30 Minutes Intravenous  Once 08/13/20 1248 08/13/20 1731   08/11/20 0100  cefTRIAXone (ROCEPHIN) 2 g in sodium chloride 0.9 % 100 mL IVPB  Status:  Discontinued        2 g 200 mL/hr over 30 Minutes Intravenous Every 24 hours 08/10/20 0859 08/15/20  1105   08/10/20 1000  remdesivir 100 mg in sodium chloride 0.9 % 100 mL IVPB       "Followed by" Linked Group Details   100 mg 200 mL/hr over 30 Minutes Intravenous Daily 08/09/20 2236 08/14/20 0959   08/10/20 0000  cefTRIAXone (ROCEPHIN) 2 g in sodium chloride 0.9 % 100 mL IVPB        2 g 200 mL/hr over 30 Minutes Intravenous  Once 08/09/20 2352 08/10/20 0125   08/09/20  2345  levofloxacin (LEVAQUIN) IVPB 750 mg  Status:  Discontinued        750 mg 100 mL/hr over 90 Minutes Intravenous  Once 08/09/20 2339 08/09/20 2351   08/09/20 2300  remdesivir 200 mg in sodium chloride 0.9% 250 mL IVPB       "Followed by" Linked Group Details   200 mg 580 mL/hr over 30 Minutes Intravenous Once 08/09/20 2236 08/10/20 0201   08/09/20 2300  azithromycin (ZITHROMAX) 500 mg in sodium chloride 0.9 % 250 mL IVPB        500 mg 250 mL/hr over 60 Minutes Intravenous Every 24 hours 08/10/20 0859 08/12/20 2259   08/09/20 2230  azithromycin (ZITHROMAX) 500 mg in sodium chloride 0.9 % 250 mL IVPB        500 mg 250 mL/hr over 60 Minutes Intravenous  Once 08/09/20 2227 08/10/20 0038        PAST MEDICAL HISTORY   Past Medical History:  Diagnosis Date  . Gout   . Hypertension   . Rupture of bowel [facility])      SURGICAL HISTORY   Past Surgical History:  Procedure Laterality Date  . COLONOSCOPY WITH PROPOFOL N/A 05/18/2020   Procedure: COLONOSCOPY WITH PROPOFOL;  Surgeon: Benjamine Sprague, DO;  Location: [facility] ENDOSCOPY;  Service: General;  Laterality: N/A;  . COLOSTOMY N/A 11/22/2019   Procedure: COLOSTOMY;  Surgeon: Herbert Pun, MD;  Location: [facility] ORS;  Service: General;  Laterality: N/A;  . LAPAROTOMY N/A 11/22/2019   Procedure: EXPLORATORY LAPAROTOMY;  Surgeon: Herbert Pun, MD;  Location: [facility] ORS;  Service: General;  Laterality: N/A;  . LAPAROTOMY N/A 07/08/2020   Procedure: EXPLORATORY LAPAROTOMY;  Surgeon: Herbert Pun, MD;  Location: [facility] ORS;  Service: General;  Laterality:  N/A;  . PARTIAL COLECTOMY N/A 11/22/2019   Procedure: PARTIAL COLECTOMY;  Surgeon: Herbert Pun, MD;  Location: [facility] ORS;  Service: General;  Laterality: N/A;  . XI ROBOTIC ASSISTED COLOSTOMY TAKEDOWN N/A 06/28/2020   Procedure: XI ROBOTIC ASSISTED COLOSTOMY TAKEDOWN CONVERTED TO OPEN PROCEDURE;  Surgeon: Herbert Pun, MD;  Location: [facility] ORS;  Service: General;  Laterality: N/A;     FAMILY HISTORY   Family History  Problem Relation Age of Onset  . Healthy Mother   . Healthy Father      SOCIAL HISTORY   Social History   Tobacco Use  . Smoking status: Never Smoker  . Smokeless tobacco: Current User    Types: Chew  Vaping Use  . Vaping Use: Never used  Substance Use Topics  . Alcohol use: Yes    Comment: pint to a fifth per day per patient . Patient reports he hasn't had anything to drink in three weeks.  . Drug use: Never     MEDICATIONS   Current Medication:  Current Facility-Administered Medications:  .  0.9 %  sodium chloride infusion, 250 mL, Intravenous, PRN, Opyd, Ilene Qua, MD, Stopped at 08/13/20 1132 .  acetaminophen (TYLENOL) tablet 650 mg, 650 mg, Per Tube, Q4H PRN, Rust-Chester, Britton L, NP, 650 mg at 08/18/20 0459 .  albuterol (VENTOLIN HFA) 108 (90 Base) MCG/ACT inhaler 2 puff, 2 puff, Inhalation, Q6H PRN, Opyd, Ilene Qua, MD .  allopurinol (ZYLOPRIM) tablet 300 mg, 300 mg, Per Tube, Daily, Lorella Nimrod, MD, 300 mg at 08/18/20 0903 .  chlorhexidine gluconate (MEDLINE KIT) (PERIDEX) 0.12 % solution 15 mL, 15 mL, Mouth Rinse, BID, Lanney Gins, Fuad, MD, 15 mL at 08/18/20 0854 .  Chlorhexidine Gluconate Cloth 2 % PADS 6 each, 6  each, Topical, Daily, Ottie Glazier, MD, 6 each at 08/17/20 1400 .  chlorpheniramine-HYDROcodone (TUSSIONEX) 10-8 MG/5ML suspension 5 mL, 5 mL, Per Tube, Q12H PRN, Lorella Nimrod, MD .  clonazePAM (KLONOPIN) disintegrating tablet 2 mg, 2 mg, Per Tube, BID, Benita Gutter, RPH, 2 mg at 08/18/20 1239 .  cloNIDine  (CATAPRES) tablet 0.3 mg, 0.3 mg, Per Tube, Q6H, Benita Gutter, RPH .  dexmedetomidine (PRECEDEX) 400 MCG/100ML (4 mcg/mL) infusion, 0.4-1.2 mcg/kg/hr, Intravenous, Titrated, Ottie Glazier, MD, Stopped at 08/17/20 2343 .  enoxaparin (LOVENOX) injection 60 mg, 0.5 mg/kg, Subcutaneous, Q2200, Opyd, Ilene Qua, MD, 60 mg at 08/17/20 2251 .  feeding supplement (PROSource TF) liquid 90 mL, 90 mL, Per Tube, QID, Tyna Jaksch, MD, 90 mL at 08/18/20 0903 .  feeding supplement (VITAL 1.5 CAL) liquid 1,000 mL, 1,000 mL, Per Tube, Continuous, Tyna Jaksch, MD, Last Rate: 45 mL/hr at 08/17/20 1233, 1,000 mL at 08/17/20 1233 .  fentaNYL (SUBLIMAZE) bolus via infusion 50-100 mcg, 50-100 mcg, Intravenous, Q2H PRN, Rust-Chester, Britton L, NP, 50 mcg at 08/16/20 1116 .  fentaNYL 2544mg in NS 2542m(1055mml) infusion-PREMIX, 0-400 mcg/hr, Intravenous, Continuous, Aleskerov, Fuad, MD, Last Rate: 30 mL/hr at 08/18/20 0932, 300 mcg/hr at 08/18/20 0932 .  free water 200 mL, 200 mL, Per Tube, Q4H, SmiCandee FurbishD, 200 mL at 08/18/20 1240 .  guaiFENesin-dextromethorphan (ROBITUSSIN DM) 100-10 MG/5ML syrup 10 mL, 10 mL, Per Tube, Q4H PRN, AmiLorella NimrodD .  HYDROcodone-acetaminophen (NORCO/VICODIN) 5-325 MG per tablet 1-2 tablet, 1-2 tablet, Per Tube, Q4H PRN, Amin, Sumayya, MD .  insulin aspart (novoLOG) injection 0-20 Units, 0-20 Units, Subcutaneous, Q4H, KeeDarel Hong NP, 11 Units at 08/18/20 1241 .  insulin aspart (novoLOG) injection 4 Units, 4 Units, Subcutaneous, Q4H, KeeDarel Hong NP, 4 Units at 08/18/20 1240 .  linezolid (ZYVOX) IVPB 600 mg, 600 mg, Intravenous, Q12H, SmiCandee FurbishD, Last Rate: 300 mL/hr at 08/18/20 0910, 600 mg at 08/18/20 0910 .  MEDLINE mouth rinse, 15 mL, Mouth Rinse, 10 times per day, AleOttie GlazierD, 15 mL at 08/18/20 0933 .  midazolam (VERSED) 50 mg/50 mL (1 mg/mL) premix infusion, 0.5-10 mg/hr, Intravenous, Continuous, Aleskerov, Fuad, MD, Last  Rate: 10 mL/hr at 08/18/20 1033, 10 mg/hr at 08/18/20 1033 .  midazolam (VERSED) injection 2 mg, 2 mg, Intravenous, Q2H PRN, AleOttie GlazierD, 2 mg at 08/14/20 1709 .  ondansetron (ZOFRAN) tablet 4 mg, 4 mg, Oral, Q6H PRN **OR** ondansetron (ZOFRAN) injection 4 mg, 4 mg, Intravenous, Q6H PRN, Opyd, Timothy S, MD .  pantoprazole sodium (PROTONIX) 40 mg/20 mL oral suspension 40 mg, 40 mg, Per Tube, Q2200, AleOttie GlazierD, 40 mg at 08/16/20 2115 .  piperacillin-tazobactam (ZOSYN) IVPB 3.375 g, 3.375 g, Intravenous, Q8H, SmiCandee FurbishD, Last Rate: 12.5 mL/hr at 08/18/20 0507, 3.375 g at 08/18/20 0507 .  sennosides (SENOKOT) 8.8 MG/5ML syrup 10 mL, 10 mL, Per Tube, BID, GruDallie PilesPH, 10 mL at 08/16/20 2115 .  sodium chloride flush (NS) 0.9 % injection 10-40 mL, 10-40 mL, Intracatheter, Q12H, AmiLorella NimrodD, 10 mL at 08/18/20 0911 .  sodium chloride flush (NS) 0.9 % injection 10-40 mL, 10-40 mL, Intracatheter, PRN, AmiLorella NimrodD .  vecuronium (NORCURON) injection 10 mg, 10 mg, Intravenous, Q1H PRN, AleOttie GlazierD, 10 mg at 08/17/20 0459    ALLERGIES   Amoxicillin    REVIEW OF SYSTEMS   Unable to obtain due to sedation, intubation, and critical illness  PHYSICAL EXAMINATION   Vital Signs: Temp:  [97.88 F (36.6 C)-100.4 F (38 C)] 100.22 F (37.9 C) (12/30 1200) Pulse Rate:  [70-89] 75 (12/30 1200) Resp:  [16-24] 23 (12/30 1200) BP: (123-165)/(64-82) 140/66 (12/30 1200) SpO2:  [91 %-96 %] 95 % (12/30 1200) FiO2 (%):  [50 %] 50 % (12/30 0810)  GENERAL:Critically ill appearing male, laying in bed, intubated and sedated, in NAD HEAD: Atraumatic, normocephalic EYES: Pupils PERRL, no scleral icterus  MOUTH: dry mucus membranes, ETT in place NECK: supple, no JVD, no thyroidmegaly PULMONARY: Unable to auscultate due to CAPR, vent assisted, even CARDIOVASCULAR: Regular rate and rhythm, 2+ distal pulses  GASTROINTESTINAL: Soft, nontender, nondistended, no  guarding or rebound tenderness MUSCULOSKELETAL: No deformities, no edema NEUROLOGIC: Sedated, withdraws from pain SKIN:warm and dry.  No obvious rashes, lesions, or ulcerations   PERTINENT DATA     Infusions: . sodium chloride Stopped (08/13/20 1132)  . dexmedetomidine (PRECEDEX) IV infusion Stopped (08/17/20 2343)  . feeding supplement (VITAL 1.5 CAL) 1,000 mL (08/17/20 1233)  . fentaNYL infusion INTRAVENOUS 300 mcg/hr (08/18/20 0932)  . linezolid (ZYVOX) IV 600 mg (08/18/20 0910)  . midazolam 10 mg/hr (08/18/20 1033)  . piperacillin-tazobactam (ZOSYN)  IV 3.375 g (08/18/20 0507)   Scheduled Medications: . allopurinol  300 mg Per Tube Daily  . chlorhexidine gluconate (MEDLINE KIT)  15 mL Mouth Rinse BID  . Chlorhexidine Gluconate Cloth  6 each Topical Daily  . clonazepam  2 mg Per Tube BID  . cloNIDine  0.3 mg Per Tube Q6H  . enoxaparin (LOVENOX) injection  0.5 mg/kg Subcutaneous Q2200  . feeding supplement (PROSource TF)  90 mL Per Tube QID  . free water  200 mL Per Tube Q4H  . insulin aspart  0-20 Units Subcutaneous Q4H  . insulin aspart  4 Units Subcutaneous Q4H  . mouth rinse  15 mL Mouth Rinse 10 times per day  . pantoprazole sodium  40 mg Per Tube Q2200  . sennosides  10 mL Per Tube BID  . sodium chloride flush  10-40 mL Intracatheter Q12H   PRN Medications: sodium chloride, acetaminophen, albuterol, chlorpheniramine-HYDROcodone, fentaNYL, guaiFENesin-dextromethorphan, HYDROcodone-acetaminophen, midazolam, ondansetron **OR** ondansetron (ZOFRAN) IV, sodium chloride flush, vecuronium Hemodynamic parameters:   Intake/Output: 12/29 0701 - 12/30 0700 In: 1803.3 [I.V.:608.8; NG/GT:840; IV Piggyback:354.4] Out: 3950 [Urine:3400; Stool:550]  Ventilator  Settings: Vent Mode: PRVC FiO2 (%):  [50 %] 50 % Set Rate:  [24 bmp] 24 bmp Vt Set:  [500 mL] 500 mL PEEP:  [10 cmH20] 10 cmH20 Plateau Pressure:  [22 cmH20-23 cmH20] 22 cmH20    LAB RESULTS:  Basic Metabolic  Panel: Recent Labs  Lab 08/14/20 0539 08/15/20 0508 08/16/20 0749 08/17/20 0719 08/18/20 0707  NA 145 143 144 145 146*  K 4.4 4.1 4.6 4.0 3.9  CL 107 107 104 100 98  CO2 '31 31 31 ' 37* 39*  GLUCOSE 189* 207* 212* 245* 254*  BUN 25* 23* 16 21* 33*  CREATININE 0.51* 0.61 0.43* 0.40* 0.63  CALCIUM 8.6* 8.3* 8.4* 8.9 8.9  MG 2.4 2.1 2.3 2.1 2.0  PHOS 2.7 3.2 2.0* 1.8* 3.5   Liver Function Tests: Recent Labs  Lab 08/12/20 0409 08/13/20 0314 08/14/20 0539  AST '23 19 17  ' ALT '21 19 17  ' ALKPHOS 40 37* 34*  BILITOT 0.5 0.6 0.7  PROT 6.6 6.3* 6.1*  ALBUMIN 2.8* 2.7* 2.8*   No results for input(s): LIPASE, AMYLASE in the last 168 hours. No results for input(s): AMMONIA in the last  168 hours. CBC: Recent Labs  Lab 08/12/20 0409 08/13/20 0314 08/14/20 0539 08/15/20 0508 08/16/20 0749 08/18/20 0851  WBC 6.2 5.8 7.4 7.9 9.5 8.3  NEUTROABS 5.4 5.0 6.8  --   --  7.5  HGB 10.4* 9.8* 9.8* 9.0* 10.2* 9.7*  HCT 34.3* 32.8* 31.7* 30.5* 33.7* 32.1*  MCV 96.1 97.3 96.6 97.8 97.1 95.5  PLT 151 145* 152 147* 164 205   Cardiac Enzymes: No results for input(s): CKTOTAL, CKMB, CKMBINDEX, TROPONINI in the last 168 hours. BNP: Invalid input(s): POCBNP CBG: Recent Labs  Lab 08/17/20 1934 08/17/20 2327 08/18/20 0349 08/18/20 0722 08/18/20 1111  GLUCAP 223* 180* 202* 255* 285*       IMAGING RESULTS:  Imaging: ECHOCARDIOGRAM COMPLETE  Result Date: 08/17/2020    ECHOCARDIOGRAM REPORT   Patient Name:   ANTRONE WALLA Date of Exam: 08/17/2020 Medical Rec #:  [id number]             Height:       72.0 in Accession #:    [id number]            Weight:       259.9 lb Date of Birth:  [birth year]              BSA:          2.382 m Patient Age:    29 years              BP:           154/79 mmHg Patient Gender: M                     HR:           76 bpm. Exam Location:  [facility] Procedure: 2D Echo, Cardiac Doppler and Color Doppler Indications:     Adult respiratory distress syndrome   History:         Patient has no prior history of Echocardiogram examinations.                  Risk Factors:Hypertension.  Sonographer:     Sherrie Sport RDCS (AE) Referring Phys:  [id number] Tyna Jaksch Diagnosing Phys: Bartholome Bill MD  Sonographer Comments: Echo performed with patient supine and on artificial respirator and Technically difficult study due to poor echo windows. IMPRESSIONS  1. Left ventricular ejection fraction, by estimation, is 70 to 75%. The left ventricle has hyperdynamic function. The left ventricle has no regional wall motion abnormalities. Left ventricular diastolic parameters were normal.  2. Right ventricular systolic function is normal. The right ventricular size is mildly enlarged.  3. The mitral valve was not well visualized. Trivial mitral valve regurgitation.  4. The aortic valve was not well visualized. Aortic valve regurgitation is not visualized. FINDINGS  Left Ventricle: Left ventricular ejection fraction, by estimation, is 70 to 75%. The left ventricle has hyperdynamic function. The left ventricle has no regional wall motion abnormalities. The left ventricular internal cavity size was normal in size. There is no left ventricular hypertrophy. Left ventricular diastolic parameters were normal. Right Ventricle: The right ventricular size is mildly enlarged. No increase in right ventricular wall thickness. Right ventricular systolic function is normal. Left Atrium: Left atrial size was normal in size. Right Atrium: Right atrial size was normal in size. Pericardium: There is no evidence of pericardial effusion. Mitral Valve: The mitral valve was not well visualized. Trivial mitral valve regurgitation. Tricuspid Valve: The tricuspid valve is not well visualized. Tricuspid  valve regurgitation is trivial. Aortic Valve: The aortic valve was not well visualized. Aortic valve regurgitation is not visualized. Pulmonic Valve: The pulmonic valve was not assessed. Pulmonic valve regurgitation  is not visualized. Aorta: The aortic root is normal in size and structure. IAS/Shunts: The interatrial septum was not assessed.  LEFT VENTRICLE PLAX 2D LVIDd:         4.34 cm LVIDs:         2.32 cm LV PW:         1.30 cm LV IVS:        1.44 cm LVOT diam:     2.20 cm LVOT Area:     3.80 cm  LEFT ATRIUM         Index LA diam:    3.30 cm 1.39 cm/m   AORTA Ao Root diam: 3.20 cm  SHUNTS Systemic Diam: 2.20 cm Bartholome Bill MD Electronically signed by Bartholome Bill MD Signature Date/Time: 08/17/2020/12:20:12 PM    Final    '@PROBHOSP' @ No results found.   ASSESSMENT AND PLAN    -Multidisciplinary rounds held today  Acute Hypoxic Respiratory Failure in the setting of COVID-19 Pneumonia with severe ARDS -Mechanical ventilation via ARDS protocol, target PRVC 6 cc/kg -Wean PEEP and FiO2 as able to maintain O2 sats >88% -Goal plateau pressure less than 30, driving pressure less than 15 -Paralytics if necessary for vent synchrony, gas exchange -Cycle prone positioning if necessary for oxygenation -Deep sedation per PAD protocol, goal RASS -4, currently fentanyl, midazolam -Diuresis as blood pressure and renal function can tolerate, goal CVP 5-8.   -VAP prevention order set -Steroids -Follow inflammatory markers: Ferritin, D-dimer, CRP, IL-6, LDH -Vitamin C, zinc   COVID-19 Pneumonia ? VAP -Monitor fever curve -Trend WBC's -Follow cultures as above -Broad spectrum abx for potential VAP (Zosyn & Zyvox).  As patient has responded so robustly to diuretic administration and antibiotics will plan to treat for a course of 5 days for VAP   Metabolic Alkalosis, likely in setting of aggressive diuresis with Lasix -Monitor I&O's / urinary output -Follow BMP -Ensure adequate renal perfusion -Avoid nephrotoxic agents as able -Replace electrolytes as indicated -Will give dose of Diamox x1 today 08/18/20   Perforated diverticulitis   - s/p colostomy     - Reviewed with General Surgery and says okay to  prone with colostomy    GI/Nutrition GI PROPHYLAXIS as indicated DIET-->TF's as tolerated Constipation protocol as indicated   Hyperglycemia -CBG's -SSI - ICU hypoglycemic\Hyperglycemia protocol   ELECTROLYTES -follow labs as needed -replace as needed -pharmacy consultation   DVT/GI PRX ordered -SCDs  TRANSFUSIONS AS NEEDED MONITOR FSBS AS SESS the need for LABS as needed        [facility] DISPOSITION: ICU GOALS OF CARE: Full code VTE PROPHYLAXIS: Lovenox UPDATES: Pt's wife was updated by Marda Stalker NP this morning 08/18/20     Critical care provider statement:    Critical care time (minutes):  35 minutes      Darel Hong, [facility] Pager: [phone]

## 2020-08-18 NOTE — Progress Notes (Signed)
Inpatient Diabetes Program Recommendations  AACE/ADA: New Consensus Statement on Inpatient Glycemic Control   Target Ranges:  Prepandial:   less than 140 mg/dL      Peak postprandial:   less than 180 mg/dL (1-2 hours)      Critically ill patients:  140 - 180 mg/dL   Results for KEYANTE, DURIO (MRN [id number]) as of 08/18/2020 10:39  Ref. Range 08/17/2020 08:00 08/17/2020 11:07 08/17/2020 16:00 08/17/2020 19:34 08/17/2020 23:27 08/18/2020 03:49 08/18/2020 07:22  Glucose-Capillary Latest Ref Range: 70 - 99 mg/dL 641 (H) 583 (H) 094 (H) 223 (H) 180 (H) 202 (H) 255 (H)   Review of Glycemic Control  Diabetes history:No Outpatient Diabetes medications:NA Current orders for Inpatient glycemic control:Novolog 0-20 units Q4H, Novolog 4 units Q4H for tube feeding coverage; Decadron 6 mg Q24H, Vital @ 45 ml/hr  Inpatient Diabetes Program Recommendations:  Insulin:If steroids are continued, please consider ordering Levemir 10 units Q24H and increasing tube feeding coverage to Novolog 7 units Q4H.  Thanks, Orlando Penner, RN, MSN, CDE Diabetes Coordinator Inpatient Diabetes Program [phone] (Team Pager from 8am to 5pm)

## 2020-08-18 NOTE — Progress Notes (Signed)
Updated pts wife Vilinda Boehringer via telephone regarding pt condition and current plan of care.  Will continue to wean down FiO2 as tolerated.  Sonda Rumble, AGNP  Pulmonary/Critical Care Pager [phone] (please enter 7 digits) PCCM Consult Pager [phone] (please enter 7 digits)

## 2020-08-19 ENCOUNTER — Inpatient Hospital Stay: Payer: No Typology Code available for payment source

## 2020-08-19 DIAGNOSIS — J9601 Acute respiratory failure with hypoxia: Secondary | ICD-10-CM | POA: Diagnosis not present

## 2020-08-19 DIAGNOSIS — U071 COVID-19: Secondary | ICD-10-CM | POA: Diagnosis not present

## 2020-08-19 LAB — GLUCOSE, CAPILLARY
Glucose-Capillary: 174 mg/dL — ABNORMAL HIGH (ref 70–99)
Glucose-Capillary: 179 mg/dL — ABNORMAL HIGH (ref 70–99)
Glucose-Capillary: 174 mg/dL — ABNORMAL HIGH (ref 70–99)
Glucose-Capillary: 125 mg/dL — ABNORMAL HIGH (ref 70–99)
Glucose-Capillary: 102 mg/dL — ABNORMAL HIGH (ref 70–99)
Glucose-Capillary: 249 mg/dL — ABNORMAL HIGH (ref 70–99)

## 2020-08-19 LAB — BLOOD GAS, ARTERIAL
Acid-Base Excess: 9.1 mmol/L — ABNORMAL HIGH (ref 0.0–2.0)
Bicarbonate: 35.7 mmol/L — ABNORMAL HIGH (ref 20.0–28.0)
FIO2: 0.6
O2 Saturation: 91.5 %
PEEP: 5 cmH2O
Patient temperature: 37
Pressure control: 22 cmH2O
RATE: 24 resp/min
pCO2 arterial: 59 mmHg — ABNORMAL HIGH (ref 32.0–48.0)
pH, Arterial: 7.39 (ref 7.350–7.450)
pO2, Arterial: 63 mmHg — ABNORMAL LOW (ref 83.0–108.0)

## 2020-08-19 LAB — CBC WITH DIFFERENTIAL/PLATELET
Abs Immature Granulocytes: 0.11 10*3/uL — ABNORMAL HIGH (ref 0.00–0.07)
Basophils Absolute: 0 10*3/uL (ref 0.0–0.1)
Basophils Relative: 0 %
Eosinophils Absolute: 0 10*3/uL (ref 0.0–0.5)
Eosinophils Relative: 0 %
HCT: 32.1 % — ABNORMAL LOW (ref 39.0–52.0)
Hemoglobin: 9.5 g/dL — ABNORMAL LOW (ref 13.0–17.0)
Immature Granulocytes: 2 %
Lymphocytes Relative: 8 %
Lymphs Abs: 0.6 10*3/uL — ABNORMAL LOW (ref 0.7–4.0)
MCH: 28.6 pg (ref 26.0–34.0)
MCHC: 29.6 g/dL — ABNORMAL LOW (ref 30.0–36.0)
MCV: 96.7 fL (ref 80.0–100.0)
Monocytes Absolute: 0.5 10*3/uL (ref 0.1–1.0)
Monocytes Relative: 7 %
Neutro Abs: 6.1 10*3/uL (ref 1.7–7.7)
Neutrophils Relative %: 83 %
Platelets: 185 10*3/uL (ref 150–400)
RBC: 3.32 MIL/uL — ABNORMAL LOW (ref 4.22–5.81)
RDW: 14.9 % (ref 11.5–15.5)
WBC: 7.3 10*3/uL (ref 4.0–10.5)
nRBC: 0.3 % — ABNORMAL HIGH (ref 0.0–0.2)

## 2020-08-19 LAB — BASIC METABOLIC PANEL
Anion gap: 8 (ref 5–15)
BUN: 27 mg/dL — ABNORMAL HIGH (ref 6–20)
CO2: 32 mmol/L (ref 22–32)
Calcium: 8.8 mg/dL — ABNORMAL LOW (ref 8.9–10.3)
Chloride: 106 mmol/L (ref 98–111)
Creatinine, Ser: 0.52 mg/dL — ABNORMAL LOW (ref 0.61–1.24)
GFR, Estimated: >60 mL/min (ref >60)
Glucose, Bld: 199 mg/dL — ABNORMAL HIGH (ref 70–99)
Potassium: 4 mmol/L (ref 3.5–5.1)
Sodium: 146 mmol/L — ABNORMAL HIGH (ref 135–145)

## 2020-08-19 LAB — MAGNESIUM: Magnesium: 2 mg/dL (ref 1.7–2.4)

## 2020-08-19 LAB — PHOSPHORUS: Phosphorus: 2.7 mg/dL (ref 2.5–4.6)

## 2020-08-19 IMAGING — DX DG CHEST 1V PORT
1 series · 1 of 1 positions shown · non-contrast
Comparison: The [DATE]

CLINICAL DATA: Acute respiratory failure, hypoxia

EXAM:
PORTABLE CHEST 1 VIEW

[chest ap]
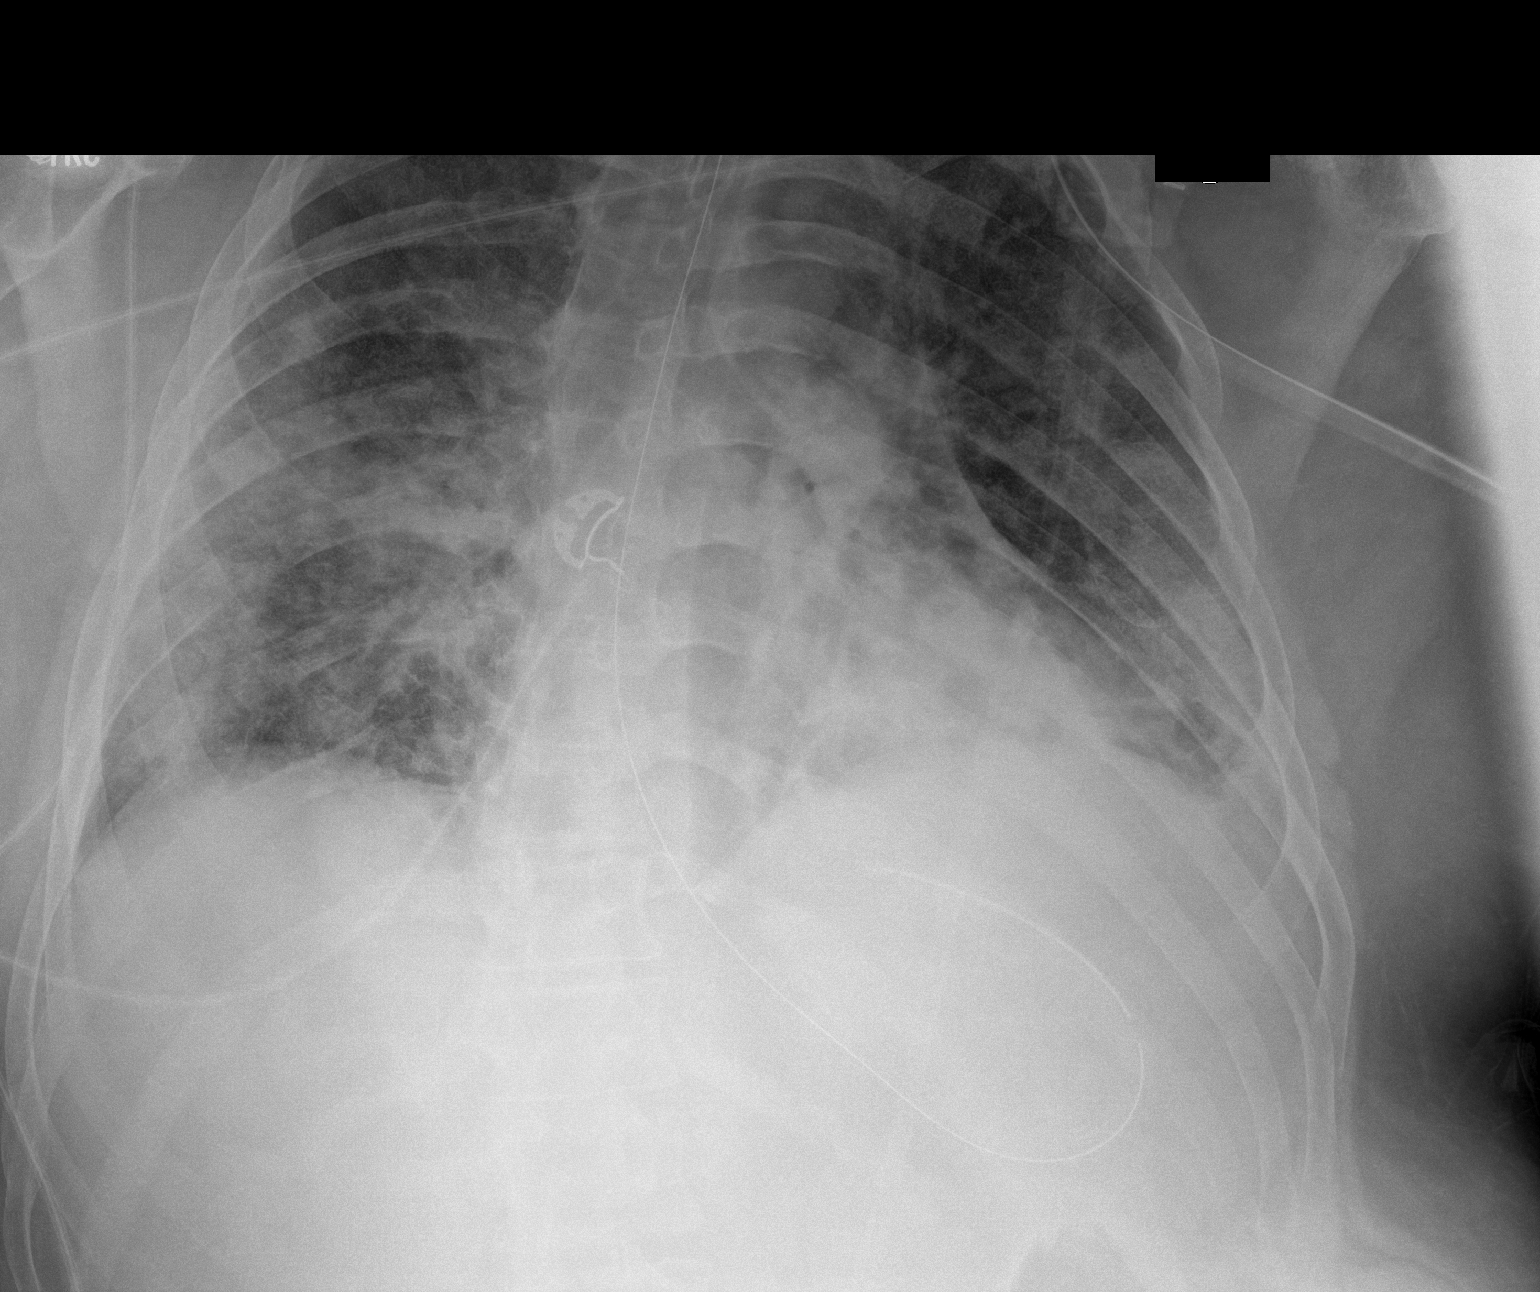

[1 of 1 positions shown; findings below may reference images not displayed]

FINDINGS: Endotracheal tube is seen 4.5 cm above the carina. Nasogastric tube
extends into the left upper quadrant of the abdomen with its tip
within the expected gastric fundus. The lungs are well expanded and
pulmonary insufflation is preserved. Extensive, diffuse, patchy
pulmonary infiltrate is again identified and appears slightly
progressive since prior examination, likely infectious or
inflammatory. No pneumothorax or pleural effusion. Cardiac size
within normal limits.
IMPRESSION: Stable support tubes.

Progressive, extensive infectious or inflammatory pulmonary
infiltrate.

## 2020-08-19 MED ORDER — QUETIAPINE FUMARATE 25 MG PO TABS
25.0000 mg | ORAL_TABLET | Freq: Two times a day (BID) | ORAL | Status: DC
  Administered 2020-08-19 – 2020-08-20 (×3): 25 mg
  Filled 2020-08-19 (×3): qty 1

## 2020-08-19 MED ORDER — ACETAMINOPHEN 10 MG/ML IV SOLN
1000.0000 mg | Freq: Four times a day (QID) | INTRAVENOUS | Status: AC | PRN
  Administered 2020-08-20: 1000 mg via INTRAVENOUS
  Filled 2020-08-19 (×2): qty 100

## 2020-08-19 MED ORDER — CLONAZEPAM 1 MG PO TABS
2.0000 mg | ORAL_TABLET | Freq: Two times a day (BID) | ORAL | Status: DC
  Administered 2020-08-19 – 2020-09-05 (×35): 2 mg
  Filled 2020-08-19 (×36): qty 2

## 2020-08-19 MED ORDER — PROSOURCE TF PO LIQD
90.0000 mL | Freq: Three times a day (TID) | ORAL | Status: DC
  Administered 2020-08-19 – 2020-08-25 (×18): 90 mL
  Filled 2020-08-19 (×20): qty 90

## 2020-08-19 MED ORDER — VITAL 1.5 CAL PO LIQD
1000.0000 mL | ORAL | Status: DC
  Administered 2020-08-21 – 2020-08-24 (×5): 1000 mL

## 2020-08-19 MED ORDER — CLONAZEPAM 1 MG PO TABS: 2.0000 mg | ORAL_TABLET | Freq: Every day | ORAL | Status: DC

## 2020-08-19 MED ORDER — MELATONIN 5 MG PO TABS
5.0000 mg | ORAL_TABLET | Freq: Every day | ORAL | Status: DC
  Administered 2020-08-19 – 2020-09-02 (×14): 5 mg
  Filled 2020-08-19 (×15): qty 1

## 2020-08-19 MED ORDER — METOPROLOL TARTRATE 5 MG/5ML IV SOLN
5.0000 mg | Freq: Four times a day (QID) | INTRAVENOUS | Status: DC | PRN
  Administered 2020-08-19: 5 mg via INTRAVENOUS
  Filled 2020-08-19: qty 5

## 2020-08-19 MED ORDER — ACETAZOLAMIDE SODIUM 500 MG IJ SOLR
500.0000 mg | Freq: Once | INTRAMUSCULAR | Status: AC
  Administered 2020-08-19: 500 mg via INTRAVENOUS
  Filled 2020-08-19: qty 500

## 2020-08-19 MED ORDER — STERILE WATER FOR INJECTION IJ SOLN
INTRAMUSCULAR | Status: AC
  Administered 2020-08-19: 5 mL
  Filled 2020-08-19: qty 10

## 2020-08-19 NOTE — Progress Notes (Addendum)
Nutrition Follow Up Note   DOCUMENTATION CODES:   Obesity unspecified  INTERVENTION:   Increase Vital 1.5 to 41m/hr  Pro-Source 932mTID via tube   Free water flushes 20052m4 hours per MD  Regimen provides 2580kcal/day, 171g/day protein and 2392m16my free water  NUTRITION DIAGNOSIS:   Inadequate oral intake related to inability to eat (pt sedated and ventilated) as evidenced by NPO status. -ongoing  GOAL:   Provide needs based on ASPEN/SCCM guidelines  -met with tube feeds  MONITOR:   Vent status,Labs,Weight trends,TF tolerance,Skin,I & O's  ASSESSMENT:   29 y37male with h/o Hartmann's for perforated diverticulitis 11/22/19, s/p takedown, parastomal hernia removal and resection of 26cm small bowel 11/917/2/09plicated by leakage of anastomosis s/p revision 07/08/20 now admitted with COVID19 induced severe ARDS. Pt developed worsening respiratory failure 12/23 requiring intubation   Pt remains sedated and ventilated. OGT in place. Pt tolerating tube feeds at goal rate; will adjust rate today in setting of > 5 days in the ICU as per COVID 19 guidelines. Refeed labs stable. No new weight since admit; will request daily weights.   Medications reviewed and include: allopurinol, lovenox, lasix, insulin, protonix, senokot, fentanyl, zosyn  Labs reviewed: Na 146(H), K 4.0 wnl, BUN 27(H), creat 0.52(L), P 2.7 wnl, Mg 2.0 wnl Hgb 9.5(L), Hct 32.1(L) cbgs- 174, 179 x 24 hrs  Patient is currently intubated on ventilator support MV: 12.6 L/min Temp (24hrs), Avg:98.8 F (37.1 C), Min:96.8 F (36 C), Max:100.22 F (37.9 C)  Propofol: none   MAP- >65mm53mUOP- 2450ml 38mt Order:    Diet Order            Diet NPO time specified  Diet effective now                EDUCATION NEEDS:   Not appropriate for education at this time  Skin:  Skin Assessment: Reviewed RN Assessment  Last BM:  12/31- 500ml v71mstomy  Height:   Ht Readings from Last 1 Encounters:   08/09/20 6' (1.829 m)    Weight:   Wt Readings from Last 1 Encounters:  08/09/20 117.9 kg    Ideal Body Weight:  80.9 kg  BMI:  Body mass index is 35.25 kg/m.  Estimated Nutritional Needs:   Kcal:  2514kcal/day  Protein:  161g/day  Fluid:  2.4-2.7L/day  Ravenna Legore CKoleen Distance, LDN Please [facility] and/or RD on-call/weekend/after hours pager

## 2020-08-19 NOTE — Progress Notes (Signed)
Recruitment maneuver done. Pt. Tolerated well.

## 2020-08-19 NOTE — Progress Notes (Signed)
CRITICAL CARE PROGRESS NOTE    Name: Craig [name] MRN: [id number] DOB: 08/19/1991  Referring physician: Dr Reesa Chew    LOS: [place]    Patient description:  29 yo male recent hospitalization for perforated diverticulitis s/p colostomy with reversal and leakage of anastomosis with s/p revision now acutely hypoxemic with COVID19 induced severe ARDS.   INTERVAL EVENTS: 08/11/20-patient continued to decline with worsening respiratory status despite 100%Fio2 on BIPAP and HFNC failure.  He stated that he feels he is dying and cannot breathe.  I discussed with wife [name] earlier today that patient is critically ill may need ETT, she is agreeable and thankful for care. I specifically discussed and explained intubation and mechanical ventilation to patient and he wishes to proceed.    08/12/20- patient was weaned on FiO2 to 60%. Spoke to wife [name].   08/13/20- Patient weaned to 40%.  During SBT today he self extubated and was placed on HFNC.  He subsequently became hypoxemic, disoriented aggitated, removed PIVs and colostomy and proceeded to have combative behavior with worsening oxygenation. I have attempted to calm patient and encouraged him to cooperate.  After numerous attempts patient continued to be combative with severe aggitation with combative behavior and would not wear oxygen. Patient was placed on sedation and MV.  Wife updated.    08/14/20- patient remains critically ill.  Weaned from 100%>>55%. Called and updated wife [name] today  12/27-Patient with worsening oxygenation on FiO2 100% peep of 14   08/16/20-Patient with continued high oxygen requirements overnight. Have spoken with surgery and there should be no problem proning patient with this. Was febrile  yesterday and thus broad spectrum abx started and cultures attained.    08-17-20-patient down to PEEP 10 FiO2 55%.  Patient never required proning  08/18/20: FiO2 weaned to 40%, PEEP 10, plan for diuresis with Diamox today, adding PO Clonazepam to assist weaning sedation  08/19/20: Vent changed to Pressure Control this morning: 50% FiO2, 24, 22/5, Plan for Recruitment maneuvers and diuresis   Lines/tubes : Closed System Drain 1 Right Abdomen Bulb (JP) 19 Fr. (Active)     Colostomy LLQ (Active)  Stoma Assessment Pink;Red 08/10/20 0944  Peristomal Assessment Intact 08/10/20 0944    Microbiology/Sepsis markers: Results for orders placed or performed during the hospital encounter of 08/09/20  Culture, blood (Routine X 2) w Reflex to ID Panel     Status: None   Collection Time: 08/09/20  9:06 PM   Specimen: BLOOD  Result Value Ref Range Status   Specimen Description BLOOD RIGHT ARM  Final   Special Requests   Final    BOTTLES DRAWN AEROBIC AND ANAEROBIC Blood Culture adequate volume   Culture   Final    NO GROWTH 5 DAYS Performed at [facility], [address], [place], [place] [address]    Report Status 08/15/2020 FINAL  Final  Culture, blood (Routine X 2) w Reflex to ID Panel     Status: None   Collection Time: 08/09/20  9:06 PM   Specimen: BLOOD  Result Value Ref Range Status   Specimen Description BLOOD RIGHT [facility]  Final   Special Requests   Final    BOTTLES DRAWN AEROBIC AND ANAEROBIC Blood Culture adequate volume   Culture   Final    NO GROWTH 5 DAYS Performed at [facility], [address] [place], [place] [address]    Report Status 08/15/2020 FINAL  Final  Resp Panel  by RT-PCR (Flu A&B, Covid) Nasopharyngeal Swab     Status: Abnormal   Collection Time: 08/09/20  9:11 PM   Specimen: Nasopharyngeal Swab; Nasopharyngeal(NP) swabs in vial transport medium  Result Value Ref Range Status   SARS Coronavirus 2 by RT PCR POSITIVE (A) NEGATIVE Final     Comment: RESULT CALLED TO, READ BACK BY AND VERIFIED WITH: JANE MARTIN _0  ON 08/09/20 SKL (NOTE) SARS-CoV-2 target nucleic acids are DETECTED.  The SARS-CoV-2 RNA is generally detectable in upper respiratory specimens during the acute phase of infection. Positive results are indicative of the presence of the identified virus, but do not rule out bacterial infection or co-infection with other pathogens not detected by the test. Clinical correlation with patient history and other diagnostic information is necessary to determine patient infection status. The expected result is Negative.  Fact Sheet for Patients: EntrepreneurPulse.com.au  Fact Sheet for Healthcare Providers: IncredibleEmployment.be  This test is not yet approved or cleared by the [place] FDA and  has been authorized for detection and/or diagnosis of SARS-CoV-2 by FDA under an Emergency Use Authorization (EUA).  This EUA will remain in effect (meaning this test can be  used) for the duration of  the COVID-19 declaration under Section 564(b)(1) of the Act, 21 U.S.C. section 360bbb-3(b)(1), unless the authorization is terminated or revoked sooner.     Influenza A by PCR NEGATIVE NEGATIVE Final   Influenza B by PCR NEGATIVE NEGATIVE Final    Comment: (NOTE) The Xpert Xpress SARS-CoV-2/FLU/RSV plus assay is intended as an aid in the diagnosis of influenza from Nasopharyngeal swab specimens and should not be used as a sole basis for treatment. Nasal washings and aspirates are unacceptable for Xpert Xpress SARS-CoV-2/FLU/RSV testing.  Fact Sheet for Patients: EntrepreneurPulse.com.au  Fact Sheet for Healthcare Providers: IncredibleEmployment.be  This test is not yet approved or cleared by the [place] FDA and has been authorized for detection and/or diagnosis of SARS-CoV-2 by FDA under an Emergency Use Authorization (EUA). This EUA  will remain in effect (meaning this test can be used) for the duration of the COVID-19 declaration under Section 564(b)(1) of the Act, 21 U.S.C. section 360bbb-3(b)(1), unless the authorization is terminated or revoked.  Performed at [facility], [address], [place], [place] [address]   Urine culture     Status: None   Collection Time: 08/10/20  4:33 AM   Specimen: In/Out Cath Urine  Result Value Ref Range Status   Specimen Description   Final    IN/OUT CATH URINE Performed at [facility], [address], [place], [place] [address]    Special Requests   Final    NONE Performed at [facility], [address] [place], [place] [address]    Culture   Final    NO GROWTH Performed at [facility], [place] [address], [place], [place] [address]    Report Status 08/11/2020 FINAL  Final  MRSA PCR Screening     Status: None   Collection Time: 08/10/20 12:40 PM   Specimen: Nasopharyngeal  Result Value Ref Range Status   MRSA by PCR NEGATIVE NEGATIVE Final    Comment:        The GeneXpert MRSA Assay (FDA approved for NASAL specimens only), is one component of a comprehensive MRSA colonization surveillance program. It is not intended to diagnose MRSA infection nor to guide or monitor treatment for MRSA infections. Performed at [facility], [address], [place], [place] [address]  CULTURE, BLOOD (ROUTINE X 2) w Reflex to ID Panel     Status: None (Preliminary result)   Collection Time: 08/15/20  1:14 PM   Specimen: BLOOD  Result Value Ref Range Status   Specimen Description BLOOD BLOOD LEFT HAND  Final   Special Requests   Final    BOTTLES DRAWN AEROBIC AND ANAEROBIC Blood Culture adequate volume   Culture   Final    NO GROWTH 4 DAYS Performed at [facility], [address], [place], [place] [address]    Report Status PENDING  Incomplete  Culture, respiratory (non-expectorated)     Status: None   Collection  Time: 08/15/20  2:00 PM   Specimen: Tracheal Aspirate; Respiratory  Result Value Ref Range Status   Specimen Description   Final    TRACHEAL ASPIRATE Performed at [facility], [address], [place], [place] [address]    Special Requests   Final    NONE Performed at [facility], [address] [place], [place] [address]    Gram Stain   Final    FEW WBC PRESENT, PREDOMINANTLY PMN FEW GRAM POSITIVE COCCI IN CLUSTERS IN CHAINS FEW YEAST    Culture   Final    Consistent with normal respiratory flora. Performed at [facility], [place] [address] [place], [place] [address]    Report Status 08/18/2020 FINAL  Final  CULTURE, BLOOD (ROUTINE X 2) w Reflex to ID Panel     Status: None (Preliminary result)   Collection Time: 08/15/20  2:34 PM   Specimen: BLOOD  Result Value Ref Range Status   Specimen Description BLOOD BLOOD LEFT HAND  Final   Special Requests   Final    BOTTLES DRAWN AEROBIC AND ANAEROBIC Blood Culture adequate volume   Culture   Final    NO GROWTH 4 DAYS Performed at [facility], [address], [place], [place] [address]    Report Status PENDING  Incomplete    Anti-infectives:  Anti-infectives (From admission, onward)   Start     Dose/Rate Route Frequency Ordered Stop   08/16/20 1800  linezolid (ZYVOX) IVPB 600 mg        600 mg 300 mL/hr over 60 Minutes Intravenous Every 12 hours 08/16/20 1357 08/19/20 2359   08/15/20 1400  piperacillin-tazobactam (ZOSYN) IVPB 3.375 g        3.375 g 12.5 mL/hr over 240 Minutes Intravenous Every 8 hours 08/15/20 1105 08/19/20 2359   08/15/20 1200  vancomycin (VANCOREADY) IVPB 2000 mg/400 mL        2,000 mg 200 mL/hr over 120 Minutes Intravenous  Once 08/15/20 1105 08/15/20 1411   08/13/20 1400  remdesivir 100 mg in sodium chloride 0.9 % 100 mL IVPB        100 mg 200 mL/hr over 30 Minutes Intravenous  Once 08/13/20 1248 08/13/20 1731   08/11/20 0100  cefTRIAXone (ROCEPHIN) 2 g in sodium  chloride 0.9 % 100 mL IVPB  Status:  Discontinued        2 g 200 mL/hr over 30 Minutes Intravenous Every 24 hours 08/10/20 0859 08/15/20 1105   08/10/20 1000  remdesivir 100 mg in sodium chloride 0.9 % 100 mL IVPB       "Followed by" Linked Group Details   100 mg 200 mL/hr over 30 Minutes Intravenous Daily 08/09/20 2236 08/14/20 0959   08/10/20 0000  cefTRIAXone (ROCEPHIN) 2 g in sodium chloride 0.9 % 100 mL IVPB  2 g 200 mL/hr over 30 Minutes Intravenous  Once 08/09/20 2352 08/10/20 0125   08/09/20 2345  levofloxacin (LEVAQUIN) IVPB 750 mg  Status:  Discontinued        750 mg 100 mL/hr over 90 Minutes Intravenous  Once 08/09/20 2339 08/09/20 2351   08/09/20 2300  remdesivir 200 mg in sodium chloride 0.9% 250 mL IVPB       "Followed by" Linked Group Details   200 mg 580 mL/hr over 30 Minutes Intravenous Once 08/09/20 2236 08/10/20 0201   08/09/20 2300  azithromycin (ZITHROMAX) 500 mg in sodium chloride 0.9 % 250 mL IVPB        500 mg 250 mL/hr over 60 Minutes Intravenous Every 24 hours 08/10/20 0859 08/12/20 2259   08/09/20 2230  azithromycin (ZITHROMAX) 500 mg in sodium chloride 0.9 % 250 mL IVPB        500 mg 250 mL/hr over 60 Minutes Intravenous  Once 08/09/20 2227 08/10/20 0038        PAST MEDICAL HISTORY   Past Medical History:  Diagnosis Date  . Gout   . Hypertension   . Rupture of bowel [facility])      SURGICAL HISTORY   Past Surgical History:  Procedure Laterality Date  . COLONOSCOPY WITH PROPOFOL N/A 05/18/2020   Procedure: COLONOSCOPY WITH PROPOFOL;  Surgeon: Benjamine Sprague, DO;  Location: [facility] ENDOSCOPY;  Service: General;  Laterality: N/A;  . COLOSTOMY N/A 11/22/2019   Procedure: COLOSTOMY;  Surgeon: Herbert Pun, MD;  Location: [facility] ORS;  Service: General;  Laterality: N/A;  . LAPAROTOMY N/A 11/22/2019   Procedure: EXPLORATORY LAPAROTOMY;  Surgeon: Herbert Pun, MD;  Location: [facility] ORS;  Service: General;  Laterality: N/A;  . LAPAROTOMY N/A  07/08/2020   Procedure: EXPLORATORY LAPAROTOMY;  Surgeon: Herbert Pun, MD;  Location: [facility] ORS;  Service: General;  Laterality: N/A;  . PARTIAL COLECTOMY N/A 11/22/2019   Procedure: PARTIAL COLECTOMY;  Surgeon: Herbert Pun, MD;  Location: [facility] ORS;  Service: General;  Laterality: N/A;  . XI ROBOTIC ASSISTED COLOSTOMY TAKEDOWN N/A 06/28/2020   Procedure: XI ROBOTIC ASSISTED COLOSTOMY TAKEDOWN CONVERTED TO OPEN PROCEDURE;  Surgeon: Herbert Pun, MD;  Location: [facility] ORS;  Service: General;  Laterality: N/A;     FAMILY HISTORY   Family History  Problem Relation Age of Onset  . Healthy Mother   . Healthy Father      SOCIAL HISTORY   Social History   Tobacco Use  . Smoking status: Never Smoker  . Smokeless tobacco: Current User    Types: Chew  Vaping Use  . Vaping Use: Never used  Substance Use Topics  . Alcohol use: Yes    Comment: pint to a fifth per day per patient . Patient reports he hasn't had anything to drink in three weeks.  . Drug use: Never     MEDICATIONS   Current Medication:  Current Facility-Administered Medications:  .  0.9 %  sodium chloride infusion, 250 mL, Intravenous, PRN, Opyd, Ilene Qua, MD, Stopped at 08/19/20 [phone] .  acetaminophen (TYLENOL) tablet 650 mg, 650 mg, Per Tube, Q4H PRN, Rust-Chester, Britton L, NP, 650 mg at 08/18/20 0459 .  albuterol (VENTOLIN HFA) 108 (90 Base) MCG/ACT inhaler 2 puff, 2 puff, Inhalation, Q6H PRN, Opyd, Ilene Qua, MD .  allopurinol (ZYLOPRIM) tablet 300 mg, 300 mg, Per Tube, Daily, Lorella Nimrod, MD, 300 mg at 08/19/20 0947 .  chlorhexidine gluconate (MEDLINE KIT) (PERIDEX) 0.12 % solution 15 mL, 15 mL, Mouth Rinse, BID, Aleskerov, Fuad,  MD, 15 mL at 08/19/20 0806 .  Chlorhexidine Gluconate Cloth 2 % PADS 6 each, 6 each, Topical, Daily, Ottie Glazier, MD, 6 each at 08/19/20 [phone] .  chlorpheniramine-HYDROcodone (TUSSIONEX) 10-8 MG/5ML suspension 5 mL, 5 mL, Per Tube, Q12H PRN, Lorella Nimrod,  MD .  clonazePAM (KLONOPIN) tablet 2 mg, 2 mg, Per Tube, BID, Benita Gutter, RPH .  cloNIDine (CATAPRES) tablet 0.3 mg, 0.3 mg, Per Tube, Q6H, Benita Gutter, RPH, 0.3 mg at 08/19/20 0947 .  enoxaparin (LOVENOX) injection 60 mg, 0.5 mg/kg, Subcutaneous, Q2200, Opyd, Ilene Qua, MD, 60 mg at 08/18/20 2221 .  feeding supplement (PROSource TF) liquid 90 mL, 90 mL, Per Tube, QID, Tyna Jaksch, MD, 90 mL at 08/19/20 0947 .  feeding supplement (VITAL 1.5 CAL) liquid 1,000 mL, 1,000 mL, Per Tube, Continuous, Tyna Jaksch, MD, Last Rate: 45 mL/hr at 08/19/20 0000, Infusion Verify at 08/19/20 0000 .  fentaNYL (SUBLIMAZE) bolus via infusion 50-100 mcg, 50-100 mcg, Intravenous, Q2H PRN, Rust-Chester, Britton L, NP, 50 mcg at 08/16/20 1116 .  fentaNYL 2577mg in NS 2560m(1094mml) infusion-PREMIX, 0-400 mcg/hr, Intravenous, Continuous, Aleskerov, Fuad, MD, Last Rate: 35 mL/hr at 08/19/20 1000, 350 mcg/hr at 08/19/20 1000 .  free water 200 mL, 200 mL, Per Tube, Q4H, SmiCandee FurbishD, 200 mL at 08/19/20 1106 .  guaiFENesin-dextromethorphan (ROBITUSSIN DM) 100-10 MG/5ML syrup 10 mL, 10 mL, Per Tube, Q4H PRN, AmiLorella NimrodD .  HYDROcodone-acetaminophen (NORCO/VICODIN) 5-325 MG per tablet 1-2 tablet, 1-2 tablet, Per Tube, Q4H PRN, Amin, Sumayya, MD .  insulin aspart (novoLOG) injection 0-20 Units, 0-20 Units, Subcutaneous, Q4H, KeeDarel Hong NP, 4 Units at 08/19/20 0756 .  insulin aspart (novoLOG) injection 4 Units, 4 Units, Subcutaneous, Q4H, KeeDarel Hong NP, 4 Units at 08/19/20 0756 .  linezolid (ZYVOX) IVPB 600 mg, 600 mg, Intravenous, Q12H, SmiCandee FurbishD, Last Rate: 300 mL/hr at 08/19/20 1000, Infusion Verify at 08/19/20 1000 .  MEDLINE mouth rinse, 15 mL, Mouth Rinse, 10 times per day, AleOttie GlazierD, 15 mL at 08/19/20 1106 .  melatonin tablet 5 mg, 5 mg, Per Tube, QHS, GonTyler PitaD .  midazolam (VERSED) 50 mg/50 mL (1 mg/mL) premix infusion, 0.5-10 mg/hr,  Intravenous, Continuous, Aleskerov, Fuad, MD, Last Rate: 9 mL/hr at 08/19/20 1105, 9 mg/hr at 08/19/20 1105 .  midazolam (VERSED) injection 2 mg, 2 mg, Intravenous, Q2H PRN, AleOttie GlazierD, 2 mg at 08/14/20 1709 .  ondansetron (ZOFRAN) tablet 4 mg, 4 mg, Oral, Q6H PRN **OR** ondansetron (ZOFRAN) injection 4 mg, 4 mg, Intravenous, Q6H PRN, Opyd, Timothy S, MD .  pantoprazole sodium (PROTONIX) 40 mg/20 mL oral suspension 40 mg, 40 mg, Per Tube, Q2200, AleOttie GlazierD, 40 mg at 08/18/20 2219 .  piperacillin-tazobactam (ZOSYN) IVPB 3.375 g, 3.375 g, Intravenous, Q8H, SmiCandee FurbishD, Stopped at 08/19/20 [phone] QUEtiapine (SEROQUEL) tablet 25 mg, 25 mg, Per Tube, BID, GonTyler PitaD .  sennosides (SENOKOT) 8.8 MG/5ML syrup 10 mL, 10 mL, Per Tube, BID, GruDallie PilesPH, 10 mL at 08/16/20 2115 .  sodium chloride flush (NS) 0.9 % injection 10-40 mL, 10-40 mL, Intracatheter, Q12H, AmiLorella NimrodD, 10 mL at 08/19/20 0953 .  sodium chloride flush (NS) 0.9 % injection 10-40 mL, 10-40 mL, Intracatheter, PRN, AmiLorella NimrodD    ALLERGIES   Amoxicillin    REVIEW OF SYSTEMS   Unable to obtain due to sedation, intubation, and critical illness  PHYSICAL EXAMINATION   Vital Signs: Temp:  [96.8 F (36 C)-100.22 F (37.9 C)] 97.34 F (36.3 C) (12/31 1100) Pulse Rate:  [70-117] 77 (12/31 1100) Resp:  [16-31] 27 (12/31 1100) BP: (103-170)/(53-81) 113/56 (12/31 1100) SpO2:  [90 %-99 %] 93 % (12/31 1100) FiO2 (%):  [40 %-60 %] 50 % (12/31 0824)  GENERAL:Critically ill appearing male, laying in bed, intubated and sedated, in NAD HEAD: Atraumatic, normocephalic,  EYES: Pupils PERRL, no scleral icterus MOUTH: Moist mucus membranes, ETT in place NECK: neck supple, no JVD no thyromegaly PULMONARY: Coarse breath sounds bilaterally, no wheezing or rhonchi, synchronouns with vent, even CARDIOVASCULAR: Regular rate and rhythm, s1s2, no M/R/G, 2+ distal pulses GASTROINTESTINAL:  Soft, nontender, nondistended, no guarding or rebound tenderness, BS+ x4 MUSCULOSKELETAL: No deformities, no edema NEUROLOGIC: Sedated (gets very agitated when sedation weaned) SKIN: Warm and dry.  No obvious rashes, lesions, or ulcerations   PERTINENT DATA     Infusions: . sodium chloride Stopped (08/19/20 0949)  . feeding supplement (VITAL 1.5 CAL) 45 mL/hr at 08/19/20 0000  . fentaNYL infusion INTRAVENOUS 350 mcg/hr (08/19/20 1000)  . linezolid (ZYVOX) IV 300 mL/hr at 08/19/20 1000  . midazolam 9 mg/hr (08/19/20 1105)  . piperacillin-tazobactam (ZOSYN)  IV Stopped (08/19/20 4656)   Scheduled Medications: . allopurinol  300 mg Per Tube Daily  . chlorhexidine gluconate (MEDLINE KIT)  15 mL Mouth Rinse BID  . Chlorhexidine Gluconate Cloth  6 each Topical Daily  . clonazePAM  2 mg Per Tube BID  . cloNIDine  0.3 mg Per Tube Q6H  . enoxaparin (LOVENOX) injection  0.5 mg/kg Subcutaneous Q2200  . feeding supplement (PROSource TF)  90 mL Per Tube QID  . free water  200 mL Per Tube Q4H  . insulin aspart  0-20 Units Subcutaneous Q4H  . insulin aspart  4 Units Subcutaneous Q4H  . mouth rinse  15 mL Mouth Rinse 10 times per day  . melatonin  5 mg Per Tube QHS  . pantoprazole sodium  40 mg Per Tube Q2200  . QUEtiapine  25 mg Per Tube BID  . sennosides  10 mL Per Tube BID  . sodium chloride flush  10-40 mL Intracatheter Q12H   PRN Medications: sodium chloride, acetaminophen, albuterol, chlorpheniramine-HYDROcodone, fentaNYL, guaiFENesin-dextromethorphan, HYDROcodone-acetaminophen, midazolam, ondansetron **OR** ondansetron (ZOFRAN) IV, sodium chloride flush Hemodynamic parameters:   Intake/Output: 12/30 0701 - 12/31 0700 In: 4189.1 [I.V.:1646.8; NG/GT:1426.5; IV Piggyback:1115.8] Out: 2950 [Urine:2450; Stool:500]  Ventilator  Settings: Vent Mode: PCV FiO2 (%):  [40 %-60 %] 50 % Set Rate:  [24 bmp] 24 bmp Vt Set:  [500 mL] 500 mL PEEP:  [5 cmH20-10 cmH20] 5 cmH20 Plateau Pressure:   [19 cmH20] 19 cmH20    LAB RESULTS:  Basic Metabolic Panel: Recent Labs  Lab 08/15/20 0508 08/16/20 0749 08/17/20 0719 08/18/20 0707 08/19/20 0609  NA 143 144 145 146* 146*  K 4.1 4.6 4.0 3.9 4.0  CL 107 104 100 98 106  CO2 31 31 37* 39* 32  GLUCOSE 207* 212* 245* 254* 199*  BUN 23* 16 21* 33* 27*  CREATININE 0.61 0.43* 0.40* 0.63 0.52*  CALCIUM 8.3* 8.4* 8.9 8.9 8.8*  MG 2.1 2.3 2.1 2.0 2.0  PHOS 3.2 2.0* 1.8* 3.5 2.7   Liver Function Tests: Recent Labs  Lab 08/13/20 0314 08/14/20 0539  AST 19 17  ALT 19 17  ALKPHOS 37* 34*  BILITOT 0.6 0.7  PROT 6.3* 6.1*  ALBUMIN 2.7* 2.8*   No results for input(s):  LIPASE, AMYLASE in the last 168 hours. No results for input(s): AMMONIA in the last 168 hours. CBC: Recent Labs  Lab 08/13/20 0314 08/14/20 0539 08/15/20 0508 08/16/20 0749 08/18/20 0851 08/19/20 0609  WBC 5.8 7.4 7.9 9.5 8.3 7.3  NEUTROABS 5.0 6.8  --   --  7.5 6.1  HGB 9.8* 9.8* 9.0* 10.2* 9.7* 9.5*  HCT 32.8* 31.7* 30.5* 33.7* 32.1* 32.1*  MCV 97.3 96.6 97.8 97.1 95.5 96.7  PLT 145* 152 147* 164 205 185   Cardiac Enzymes: No results for input(s): CKTOTAL, CKMB, CKMBINDEX, TROPONINI in the last 168 hours. BNP: Invalid input(s): POCBNP CBG: Recent Labs  Lab 08/18/20 1701 08/18/20 1946 08/18/20 2308 08/19/20 0314 08/19/20 0731  GLUCAP 157* 118* 259* 174* 179*       IMAGING RESULTS:  Imaging: DG Chest Port 1 View  Result Date: 08/19/2020 CLINICAL DATA:  Acute respiratory failure, hypoxia EXAM: PORTABLE CHEST 1 VIEW COMPARISON:  The 08/15/2020 FINDINGS: Endotracheal tube is seen 4.5 cm above the carina. Nasogastric tube extends into the left upper quadrant of the abdomen with its tip within the expected gastric fundus. The lungs are well expanded and pulmonary insufflation is preserved. Extensive, diffuse, patchy pulmonary infiltrate is again identified and appears slightly progressive since prior examination, likely infectious or  inflammatory. No pneumothorax or pleural effusion. Cardiac size within normal limits. IMPRESSION: Stable support tubes. Progressive, extensive infectious or inflammatory pulmonary infiltrate. Electronically Signed   By: Fidela Salisbury MD   On: 08/19/2020 05:07   _0 @ DG Chest Port 1 View  Result Date: 08/19/2020 CLINICAL DATA:  Acute respiratory failure, hypoxia EXAM: PORTABLE CHEST 1 VIEW COMPARISON:  The 08/15/2020 FINDINGS: Endotracheal tube is seen 4.5 cm above the carina. Nasogastric tube extends into the left upper quadrant of the abdomen with its tip within the expected gastric fundus. The lungs are well expanded and pulmonary insufflation is preserved. Extensive, diffuse, patchy pulmonary infiltrate is again identified and appears slightly progressive since prior examination, likely infectious or inflammatory. No pneumothorax or pleural effusion. Cardiac size within normal limits. IMPRESSION: Stable support tubes. Progressive, extensive infectious or inflammatory pulmonary infiltrate. Electronically Signed   By: Fidela Salisbury MD   On: 08/19/2020 05:07     ASSESSMENT AND PLAN    -Multidisciplinary rounds held today  Acute Hypoxic Respiratory Failure in the setting of COVID-19 Pneumonia with severe ARDS -Mechanical ventilation via ARDS protocol, target PRVC 6 cc/kg>> currently on PC 20/5 -Wean PEEP and FiO2 as able to maintain O2 sats >88% -Goal plateau pressure less than 30, driving pressure less than 15 -Paralytics if necessary for vent synchrony, gas exchange -Cycle prone positioning if necessary for oxygenation -Deep sedation per PAD protocol, goal RASS -4, currently fentanyl, midazolam -Diuresis as blood pressure and renal function can tolerate, goal CVP 5-8. >>given Diamox today 12/31 -VAP prevention order set -Steroids -Follow inflammatory markers: Ferritin, D-dimer, CRP, IL-6, LDH -Vitamin C, zinc -Start Recruitment maneuvers    COVID-19 Pneumonia ? VAP -Monitor  fever curve -Trend WBC's -Follow cultures -Continue Zosyn & Zyvox (plan for 5 days) for potential VAP    Metabolic Alkalosis, likely in setting of aggressive diuresis with Lasix>>improved -Monitor I&O's / urinary output -Follow BMP -Ensure adequate renal perfusion -Avoid nephrotoxic agents as able -Replace electrolytes as indicated -Received dose of Diamox again today 75/17   Acute Metabolic Encephalopathy -Maintain a RASS of -3 to -4 -Fentanyl and versed gtts to maintain RASS goal -Adding Seroquel today to assist with weaning sedation and agitation  -  Daily wake up assessments -Provide supportive care   Anemia without s/sx of bleeding -Monitor for S/Sx of bleeding -Trend CBC -Lovenox for VTE Prophylaxis  -Transfuse for Hgb <7   Perforated diverticulitis   - s/p colostomy     - Reviewed with General Surgery and says okay to prone with colostomy    GI/Nutrition -GI PROPHYLAXIS as indicated -DIET-->TF's as tolerated -Constipation protocol as indicated   Hyperglycemia -CBG's -SSI -Follow ICU Hypo/Hyperglycemia protocol            [facility] DISPOSITION: ICU GOALS OF CARE: Full code SUP: Protonix VTE PROPHYLAXIS: Lovenox UPDATES: Updated pt's significant other [name] Breashears via telephone 08/19/20     Critical care provider statement:    Critical care time (minutes):  32 minutes      Darel Hong, AGACNP-BC [facility] Pager: [phone]

## 2020-08-20 ENCOUNTER — Inpatient Hospital Stay: Payer: No Typology Code available for payment source

## 2020-08-20 DIAGNOSIS — U071 COVID-19: Secondary | ICD-10-CM | POA: Diagnosis not present

## 2020-08-20 DIAGNOSIS — J9601 Acute respiratory failure with hypoxia: Secondary | ICD-10-CM | POA: Diagnosis not present

## 2020-08-20 LAB — BASIC METABOLIC PANEL
Anion gap: 6 (ref 5–15)
BUN: 20 mg/dL (ref 6–20)
CO2: 31 mmol/L (ref 22–32)
Calcium: 8.8 mg/dL — ABNORMAL LOW (ref 8.9–10.3)
Chloride: 104 mmol/L (ref 98–111)
Creatinine, Ser: 0.47 mg/dL — ABNORMAL LOW (ref 0.61–1.24)
GFR, Estimated: >60 mL/min (ref >60)
Glucose, Bld: 265 mg/dL — ABNORMAL HIGH (ref 70–99)
Potassium: 4 mmol/L (ref 3.5–5.1)
Sodium: 141 mmol/L (ref 135–145)

## 2020-08-20 LAB — CBC WITH DIFFERENTIAL/PLATELET
Abs Immature Granulocytes: 0.16 10*3/uL — ABNORMAL HIGH (ref 0.00–0.07)
Basophils Absolute: 0 10*3/uL (ref 0.0–0.1)
Basophils Relative: 0 %
Eosinophils Absolute: 0 10*3/uL (ref 0.0–0.5)
Eosinophils Relative: 0 %
HCT: 33.6 % — ABNORMAL LOW (ref 39.0–52.0)
Hemoglobin: 10.1 g/dL — ABNORMAL LOW (ref 13.0–17.0)
Immature Granulocytes: 2 %
Lymphocytes Relative: 8 %
Lymphs Abs: 0.7 10*3/uL (ref 0.7–4.0)
MCH: 29.1 pg (ref 26.0–34.0)
MCHC: 30.1 g/dL (ref 30.0–36.0)
MCV: 96.8 fL (ref 80.0–100.0)
Monocytes Absolute: 0.3 10*3/uL (ref 0.1–1.0)
Monocytes Relative: 3 %
Neutro Abs: 7.4 10*3/uL (ref 1.7–7.7)
Neutrophils Relative %: 87 %
Platelets: 165 10*3/uL (ref 150–400)
RBC: 3.47 MIL/uL — ABNORMAL LOW (ref 4.22–5.81)
RDW: 15.7 % — ABNORMAL HIGH (ref 11.5–15.5)
WBC: 8.5 10*3/uL (ref 4.0–10.5)
nRBC: 0.4 % — ABNORMAL HIGH (ref 0.0–0.2)

## 2020-08-20 LAB — CULTURE, BLOOD (ROUTINE X 2)
Culture: NO GROWTH
Culture: NO GROWTH
Special Requests: ADEQUATE
Special Requests: ADEQUATE

## 2020-08-20 LAB — BLOOD GAS, ARTERIAL
Acid-Base Excess: 5 mmol/L — ABNORMAL HIGH (ref 0.0–2.0)
Acid-Base Excess: 6.6 mmol/L — ABNORMAL HIGH (ref 0.0–2.0)
Acid-Base Excess: 7.8 mmol/L — ABNORMAL HIGH (ref 0.0–2.0)
Acid-Base Excess: 2.9 mmol/L — ABNORMAL HIGH (ref 0.0–2.0)
Bicarbonate: 34.1 mmol/L — ABNORMAL HIGH (ref 20.0–28.0)
Bicarbonate: 35.4 mmol/L — ABNORMAL HIGH (ref 20.0–28.0)
Bicarbonate: 37.1 mmol/L — ABNORMAL HIGH (ref 20.0–28.0)
Bicarbonate: 33.8 mmol/L — ABNORMAL HIGH (ref 20.0–28.0)
FIO2: 0.75
FIO2: 0.75
FIO2: 0.65
FIO2: 0.75
MECHVT: 550 mL
Mechanical Rate: 24
O2 Saturation: 89.4 %
O2 Saturation: 90.9 %
O2 Saturation: 83.2 %
O2 Saturation: 90.7 %
PEEP: 8 cmH2O
PEEP: 8 cmH2O
PEEP: 8 cmH2O
PEEP: 8 cmH2O
Patient temperature: 37
Patient temperature: 37
Patient temperature: 37
Patient temperature: 37
Pressure control: 25 cmH2O
Pressure control: 25 cmH2O
RATE: 26 resp/min
RATE: 26 resp/min
RATE: 26 resp/min
pCO2 arterial: 76 mmHg (ref 32.0–48.0)
pCO2 arterial: 77 mmHg (ref 32.0–48.0)
pCO2 arterial: 79 mmHg (ref 32.0–48.0)
pCO2 arterial: 77 mmHg (ref 32.0–48.0)
pH, Arterial: 7.26 — ABNORMAL LOW (ref 7.350–7.450)
pH, Arterial: 7.27 — ABNORMAL LOW (ref 7.350–7.450)
pH, Arterial: 7.28 — ABNORMAL LOW (ref 7.350–7.450)
pH, Arterial: 7.25 — ABNORMAL LOW (ref 7.350–7.450)
pO2, Arterial: 66 mmHg — ABNORMAL LOW (ref 83.0–108.0)
pO2, Arterial: 69 mmHg — ABNORMAL LOW (ref 83.0–108.0)
pO2, Arterial: 54 mmHg — ABNORMAL LOW (ref 83.0–108.0)
pO2, Arterial: 70 mmHg — ABNORMAL LOW (ref 83.0–108.0)

## 2020-08-20 LAB — GLUCOSE, CAPILLARY
Glucose-Capillary: 245 mg/dL — ABNORMAL HIGH (ref 70–99)
Glucose-Capillary: 260 mg/dL — ABNORMAL HIGH (ref 70–99)
Glucose-Capillary: 224 mg/dL — ABNORMAL HIGH (ref 70–99)
Glucose-Capillary: 120 mg/dL — ABNORMAL HIGH (ref 70–99)
Glucose-Capillary: 291 mg/dL — ABNORMAL HIGH (ref 70–99)

## 2020-08-20 LAB — BRAIN NATRIURETIC PEPTIDE: B Natriuretic Peptide: 46.9 pg/mL (ref 0.0–100.0)

## 2020-08-20 LAB — MAGNESIUM: Magnesium: 1.9 mg/dL (ref 1.7–2.4)

## 2020-08-20 LAB — URIC ACID: Uric Acid, Serum: 1.2 mg/dL — ABNORMAL LOW (ref 3.7–8.6)

## 2020-08-20 LAB — TROPONIN I (HIGH SENSITIVITY): Troponin I (High Sensitivity): 32 ng/L — ABNORMAL HIGH (ref <18)

## 2020-08-20 LAB — PHOSPHORUS: Phosphorus: 3.3 mg/dL (ref 2.5–4.6)

## 2020-08-20 IMAGING — DX DG CHEST 1V PORT
2 series · 2 of 2 positions shown · non-contrast
Comparison: Chest radiograph from one day prior.

CLINICAL DATA: Acute respiratory failure with hypoxia

EXAM:
PORTABLE CHEST 1 VIEW

[chest ap (1 of 2)]
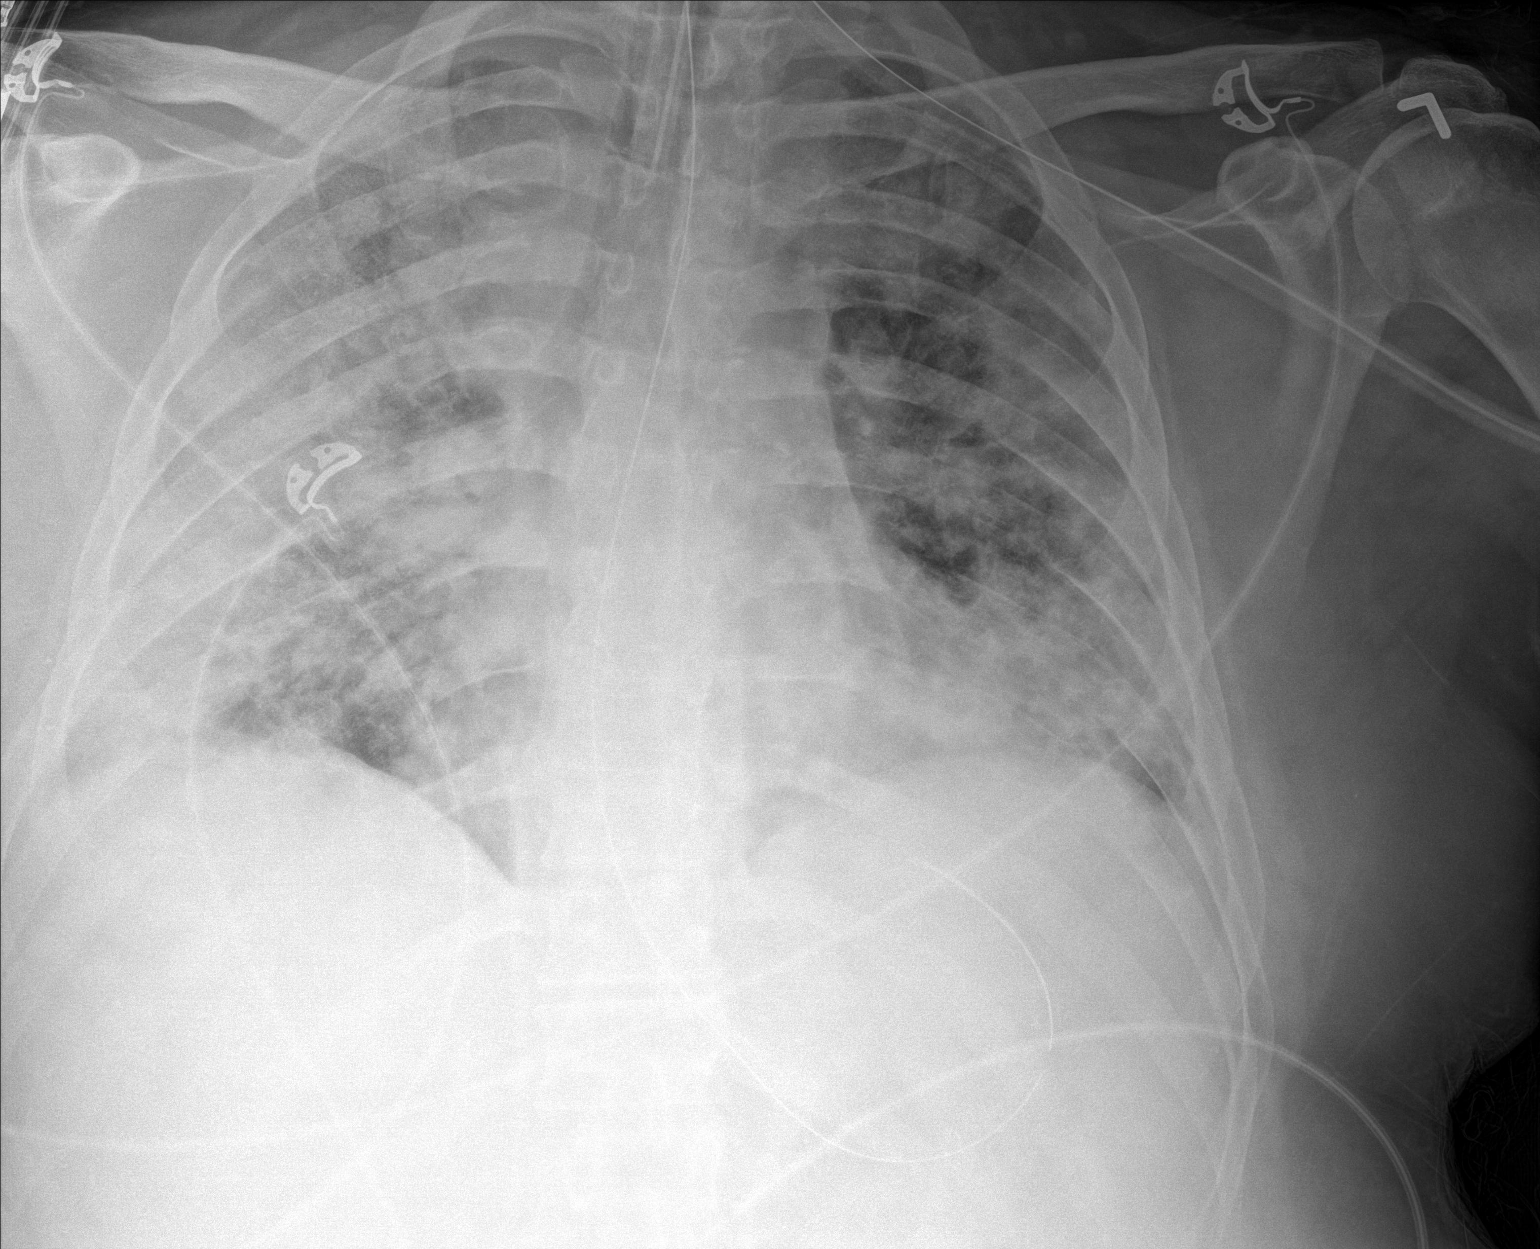

[chest ap (2 of 2)]
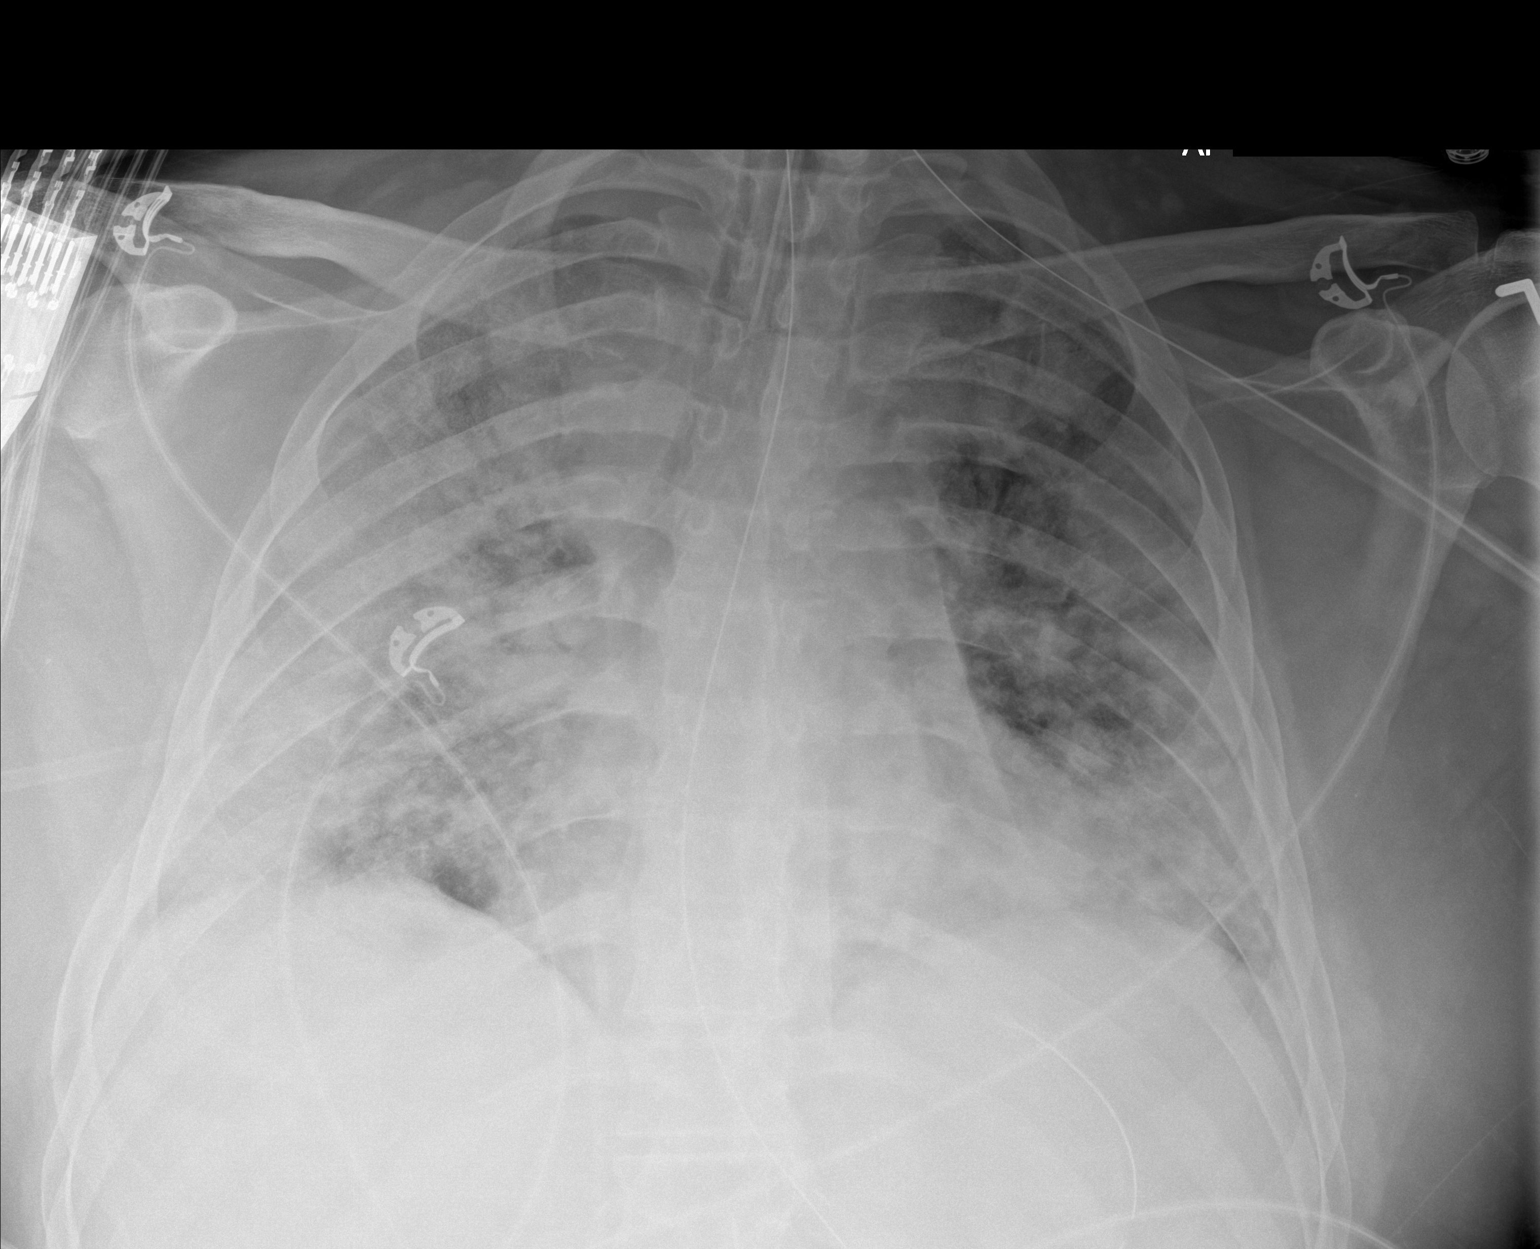

[2 of 2 positions shown; findings below may reference images not displayed]

FINDINGS: Endotracheal tube tip is 3.8 cm above the carina. Enteric tube
terminates in the gastric fundus. Stable cardiomediastinal
silhouette with normal heart size. No pneumothorax. No pleural
effusion. Extensive patchy consolidation throughout both lungs,
worsened.
IMPRESSION: 1. Well-positioned support structures.
2. Extensive patchy consolidation throughout both lungs, worsened,
compatible with multilobar pneumonia.

## 2020-08-20 IMAGING — DX DG CHEST 1V
1 series · 1 of 1 positions shown · non-contrast
Comparison: [DATE]

CLINICAL DATA: Repositioned to prone position

EXAM:
CHEST  1 VIEW

[chest pa]
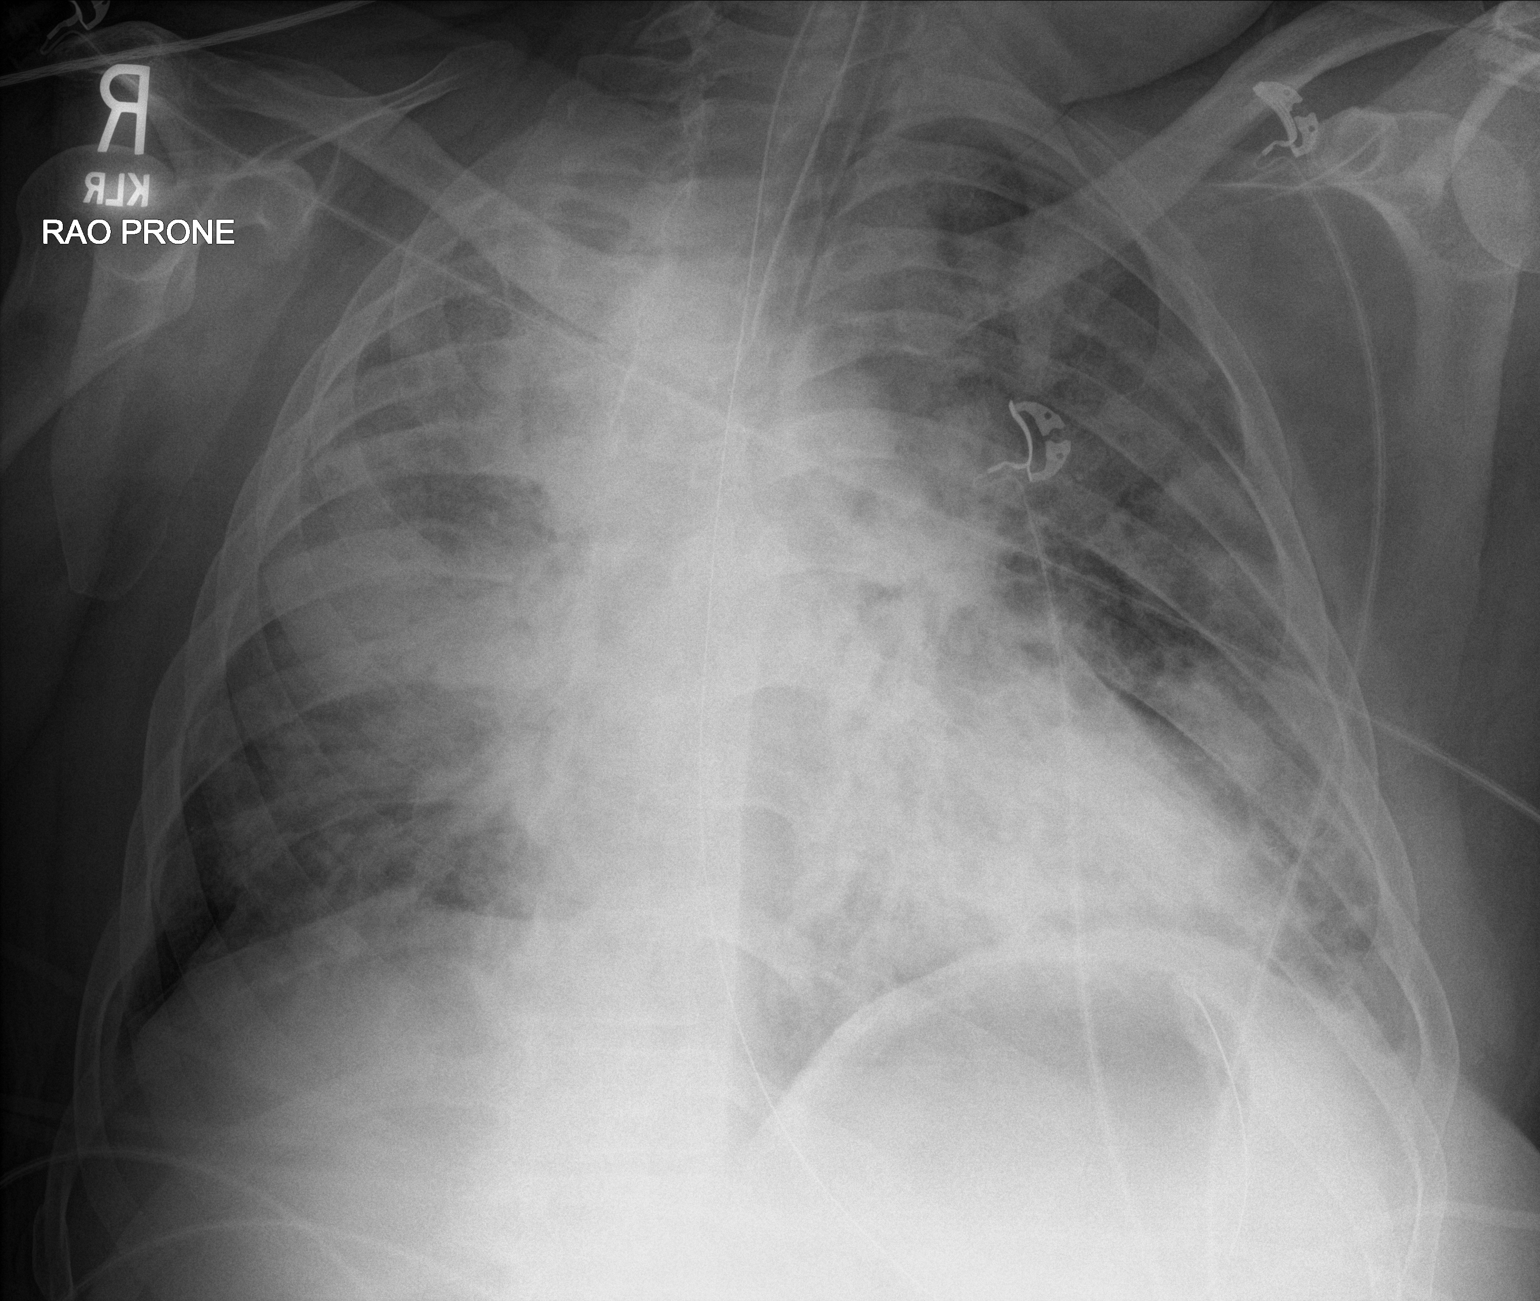

[1 of 1 positions shown; findings below may reference images not displayed]

FINDINGS: Endotracheal tube is 3.8 cm above the carina. NG tube is in the
stomach. Extensive bilateral airspace disease, unchanged. Heart is
normal size. No effusions or pneumothorax.
IMPRESSION: Continued extensive bilateral airspace disease, unchanged.

## 2020-08-20 MED ORDER — METOPROLOL TARTRATE 5 MG/5ML IV SOLN
5.0000 mg | Freq: Once | INTRAVENOUS | Status: AC
  Administered 2020-08-20: 5 mg via INTRAVENOUS
  Filled 2020-08-20: qty 5

## 2020-08-20 MED ORDER — METOPROLOL TARTRATE 25 MG PO TABS
25.0000 mg | ORAL_TABLET | Freq: Two times a day (BID) | ORAL | Status: DC
  Administered 2020-08-20 – 2020-08-25 (×10): 25 mg via ORAL
  Filled 2020-08-20 (×10): qty 1

## 2020-08-20 MED ORDER — FAMOTIDINE 20 MG PO TABS
20.0000 mg | ORAL_TABLET | Freq: Two times a day (BID) | ORAL | Status: DC
  Administered 2020-08-20 – 2020-08-25 (×11): 20 mg via ORAL
  Filled 2020-08-20 (×11): qty 1

## 2020-08-20 MED ORDER — INSULIN DETEMIR 100 UNIT/ML ~~LOC~~ SOLN
10.0000 [IU] | Freq: Every day | SUBCUTANEOUS | Status: DC
  Administered 2020-08-20: 10 [IU] via SUBCUTANEOUS
  Filled 2020-08-20: qty 0.1

## 2020-08-20 MED ORDER — DEXAMETHASONE 4 MG PO TABS
6.0000 mg | ORAL_TABLET | Freq: Every day | ORAL | Status: DC
  Administered 2020-08-20 – 2020-08-25 (×6): 6 mg via ORAL
  Filled 2020-08-20 (×6): qty 1.5

## 2020-08-20 MED ORDER — VECURONIUM BROMIDE 10 MG IV SOLR
INTRAVENOUS | Status: AC
  Administered 2020-08-20: 10 mg
  Filled 2020-08-20: qty 10

## 2020-08-20 MED ORDER — LORATADINE 10 MG PO TABS: 10.0000 mg | ORAL_TABLET | Freq: Every day | ORAL | Status: DC

## 2020-08-20 MED ORDER — CETIRIZINE HCL 10 MG PO TABS
10.0000 mg | ORAL_TABLET | Freq: Every day | ORAL | Status: DC
  Administered 2020-08-20 – 2020-08-25 (×6): 10 mg via ORAL
  Filled 2020-08-20 (×6): qty 1

## 2020-08-20 MED ORDER — METOPROLOL TARTRATE 25 MG PO TABS
12.5000 mg | ORAL_TABLET | Freq: Two times a day (BID) | ORAL | Status: DC
  Administered 2020-08-20: 12.5 mg via ORAL
  Filled 2020-08-20: qty 1

## 2020-08-20 MED ORDER — VECURONIUM BOLUS VIA INFUSION
5.0000 mg | INTRAVENOUS | Status: DC | PRN
  Filled 2020-08-20: qty 5

## 2020-08-20 MED ORDER — CLONIDINE HCL 0.1 MG PO TABS
0.2000 mg | ORAL_TABLET | Freq: Two times a day (BID) | ORAL | Status: DC
  Administered 2020-08-20 – 2020-08-25 (×10): 0.2 mg
  Filled 2020-08-20 (×10): qty 2

## 2020-08-20 MED ORDER — QUETIAPINE FUMARATE 25 MG PO TABS
50.0000 mg | ORAL_TABLET | Freq: Two times a day (BID) | ORAL | Status: DC
  Administered 2020-08-20 – 2020-08-25 (×10): 50 mg
  Filled 2020-08-20 (×10): qty 2

## 2020-08-20 NOTE — Progress Notes (Signed)
CRITICAL CARE PROGRESS NOTE    Name: Craig [name] MRN: [id number] DOB: Oct 04, 1990  Referring physician: Dr Reesa Chew    LOS: [place]    Patient description:  30 yo male recent hospitalization for perforated diverticulitis s/p colostomy with reversal and leakage of anastomosis with s/p revision now acutely hypoxemic with COVID19 induced severe ARDS.   INTERVAL EVENTS: 08/11/20-patient continued to decline with worsening respiratory status despite 100%Fio2 on BIPAP and HFNC failure.  He stated that he feels he is dying and cannot breathe.  I discussed with wife [name] earlier today that patient is critically ill may need ETT, she is agreeable and thankful for care. I specifically discussed and explained intubation and mechanical ventilation to patient and he wishes to proceed.   08/12/20- patient was weaned on FiO2 to 60%. Spoke to wife [name].  08/13/20- Patient weaned to 40%.  During SBT today he self extubated and was placed on HFNC.  He subsequently became hypoxemic, disoriented aggitated, removed PIVs and colostomy and proceeded to have combative behavior with worsening oxygenation. I have attempted to calm patient and encouraged him to cooperate.  After numerous attempts patient continued to be combative with severe aggitation with combative behavior and would not wear oxygen. Patient was placed on sedation and MV.  Wife updated.   08/14/20- patient remains critically ill.  Weaned from 100%>>55%. Called and updated wife [name] today 12/27-Patient with worsening oxygenation on FiO2 100% peep of 14 08/16/20-Patient with continued high oxygen requirements overnight. Have spoken with surgery and there should be no problem proning patient with this. Was febrile yesterday and  thus broad spectrum abx started and cultures attained.  08-17-20-patient down to PEEP 10 FiO2 55%.  Patient never required proning 08/18/20: FiO2 weaned to 40%, PEEP 10, plan for diuresis with Diamox today, adding PO Clonazepam to assist weaning sedation 08/19/20: Vent changed to Pressure Control this morning: 50% FiO2, 24, 22/5, Plan for Recruitment maneuvers and diuresis 08/20/2020: Continued issues with hypercapnia due to the dead space ventilation.  Worsening despite pressure control.  Switch back to [facility], prone positioning  Lines/tubes : Closed System Drain 1 Right Abdomen Bulb (JP) 19 Fr. (Active)     Colostomy LLQ (Active)  Stoma Assessment Pink;Red 08/10/20 0944  Peristomal Assessment Intact 08/10/20 0944    Microbiology/Sepsis markers: Results for orders placed or performed during the hospital encounter of 08/09/20  Culture, blood (Routine X 2) w Reflex to ID Panel     Status: None   Collection Time: 08/09/20  9:06 PM   Specimen: BLOOD  Result Value Ref Range Status   Specimen Description BLOOD RIGHT ARM  Final   Special Requests   Final    BOTTLES DRAWN AEROBIC AND ANAEROBIC Blood Culture adequate volume   Culture   Final    NO GROWTH 5 DAYS Performed at [facility], [address], [place], [place] [address]    Report Status 08/15/2020 FINAL  Final  Culture, blood (Routine X 2) w Reflex to ID Panel     Status: None   Collection Time: 08/09/20  9:06 PM   Specimen: BLOOD  Result Value Ref Range Status   Specimen Description BLOOD RIGHT [facility]  Final   Special Requests   Final    BOTTLES DRAWN AEROBIC AND ANAEROBIC Blood Culture adequate volume   Culture   Final    NO GROWTH 5 DAYS Performed at [facility], [address], [place], [place] [address]  Report Status 08/15/2020 FINAL  Final  Resp Panel by RT-PCR (Flu A&B, Covid) Nasopharyngeal Swab     Status: Abnormal   Collection Time: 08/09/20  9:11 PM   Specimen: Nasopharyngeal Swab;  Nasopharyngeal(NP) swabs in vial transport medium  Result Value Ref Range Status   SARS Coronavirus 2 by RT PCR POSITIVE (A) NEGATIVE Final    Comment: RESULT CALLED TO, READ BACK BY AND VERIFIED WITH: JANE MARTIN _0  ON 08/09/20 SKL (NOTE) SARS-CoV-2 target nucleic acids are DETECTED.  The SARS-CoV-2 RNA is generally detectable in upper respiratory specimens during the acute phase of infection. Positive results are indicative of the presence of the identified virus, but do not rule out bacterial infection or co-infection with other pathogens not detected by the test. Clinical correlation with patient history and other diagnostic information is necessary to determine patient infection status. The expected result is Negative.  Fact Sheet for Patients: EntrepreneurPulse.com.au  Fact Sheet for Healthcare Providers: IncredibleEmployment.be  This test is not yet approved or cleared by the [place] FDA and  has been authorized for detection and/or diagnosis of SARS-CoV-2 by FDA under an Emergency Use Authorization (EUA).  This EUA will remain in effect (meaning this test can be  used) for the duration of  the COVID-19 declaration under Section 564(b)(1) of the Act, 21 U.S.C. section 360bbb-3(b)(1), unless the authorization is terminated or revoked sooner.     Influenza A by PCR NEGATIVE NEGATIVE Final   Influenza B by PCR NEGATIVE NEGATIVE Final    Comment: (NOTE) The Xpert Xpress SARS-CoV-2/FLU/RSV plus assay is intended as an aid in the diagnosis of influenza from Nasopharyngeal swab specimens and should not be used as a sole basis for treatment. Nasal washings and aspirates are unacceptable for Xpert Xpress SARS-CoV-2/FLU/RSV testing.  Fact Sheet for Patients: EntrepreneurPulse.com.au  Fact Sheet for Healthcare Providers: IncredibleEmployment.be  This test is not yet approved or cleared by the  [place] FDA and has been authorized for detection and/or diagnosis of SARS-CoV-2 by FDA under an Emergency Use Authorization (EUA). This EUA will remain in effect (meaning this test can be used) for the duration of the COVID-19 declaration under Section 564(b)(1) of the Act, 21 U.S.C. section 360bbb-3(b)(1), unless the authorization is terminated or revoked.  Performed at [facility], [address] [place], [place] [address]   Urine culture     Status: None   Collection Time: 08/10/20  4:33 AM   Specimen: In/Out Cath Urine  Result Value Ref Range Status   Specimen Description   Final    IN/OUT CATH URINE Performed at [facility], [address] [place], [place] [address]    Special Requests   Final    NONE Performed at [facility], [address], [place], [place] [address]    Culture   Final    NO GROWTH Performed at [facility], [place] [address], [place], [place] [address]    Report Status 08/11/2020 FINAL  Final  MRSA PCR Screening     Status: None   Collection Time: 08/10/20 12:40 PM   Specimen: Nasopharyngeal  Result Value Ref Range Status   MRSA by PCR NEGATIVE NEGATIVE Final    Comment:        The GeneXpert MRSA Assay (FDA approved for NASAL specimens only), is one component of a comprehensive MRSA colonization surveillance program. It is not intended to diagnose MRSA infection nor to guide or monitor treatment for MRSA infections. Performed at [facility], [place], [place] [address]   CULTURE, BLOOD (ROUTINE X 2) w Reflex to ID Panel     Status: None   Collection Time: 08/15/20  1:14 PM   Specimen: BLOOD  Result Value Ref Range Status   Specimen Description BLOOD BLOOD LEFT HAND  Final   Special Requests   Final    BOTTLES DRAWN AEROBIC AND ANAEROBIC Blood Culture adequate volume   Culture   Final    NO GROWTH 5 DAYS Performed at [facility], [address],  [place], [place] [address]    Report Status 08/20/2020 FINAL  Final  Culture, respiratory (non-expectorated)     Status: None   Collection Time: 08/15/20  2:00 PM   Specimen: Tracheal Aspirate; Respiratory  Result Value Ref Range Status   Specimen Description   Final    TRACHEAL ASPIRATE Performed at [facility], [address] [place], [place] [address]    Special Requests   Final    NONE Performed at [facility], [address] [place], [place] [address]    Gram Stain   Final    FEW WBC PRESENT, PREDOMINANTLY PMN FEW GRAM POSITIVE COCCI IN CLUSTERS IN CHAINS FEW YEAST    Culture   Final    Consistent with normal respiratory flora. Performed at [facility], [place] [address], [place], [place] [address]    Report Status 08/18/2020 FINAL  Final  CULTURE, BLOOD (ROUTINE X 2) w Reflex to ID Panel     Status: None   Collection Time: 08/15/20  2:34 PM   Specimen: BLOOD  Result Value Ref Range Status   Specimen Description BLOOD BLOOD LEFT HAND  Final   Special Requests   Final    BOTTLES DRAWN AEROBIC AND ANAEROBIC Blood Culture adequate volume   Culture   Final    NO GROWTH 5 DAYS Performed at [facility], [address], [place],  [address]    Report Status 08/20/2020 FINAL  Final    Anti-infectives:  Anti-infectives (From admission, onward)   Start     Dose/Rate Route Frequency Ordered Stop   08/16/20 1800  linezolid (ZYVOX) IVPB 600 mg        600 mg 300 mL/hr over 60 Minutes Intravenous Every 12 hours 08/16/20 1357 08/19/20 2357   08/15/20 1400  piperacillin-tazobactam (ZOSYN) IVPB 3.375 g        3.375 g 12.5 mL/hr over 240 Minutes Intravenous Every 8 hours 08/15/20 1105 08/20/20 0145   08/15/20 1200  vancomycin (VANCOREADY) IVPB 2000 mg/400 mL        2,000 mg 200 mL/hr over 120 Minutes Intravenous  Once 08/15/20 1105 08/15/20 1411   08/13/20 1400  remdesivir 100 mg in sodium chloride 0.9 % 100 mL IVPB        100 mg 200 mL/hr  over 30 Minutes Intravenous  Once 08/13/20 1248 08/13/20 1731   08/11/20 0100  cefTRIAXone (ROCEPHIN) 2 g in sodium chloride 0.9 % 100 mL IVPB  Status:  Discontinued        2 g 200 mL/hr over 30 Minutes Intravenous Every 24 hours 08/10/20 0859 08/15/20 1105   08/10/20 1000  remdesivir 100 mg in sodium chloride 0.9 % 100 mL IVPB       "Followed by" Linked Group Details   100 mg 200 mL/hr over 30 Minutes Intravenous Daily 08/09/20 2236 08/14/20 0959   08/10/20 0000  cefTRIAXone (ROCEPHIN) 2 g in sodium chloride 0.9 %  100 mL IVPB        2 g 200 mL/hr over 30 Minutes Intravenous  Once 08/09/20 2352 08/10/20 0125   08/09/20 2345  levofloxacin (LEVAQUIN) IVPB 750 mg  Status:  Discontinued        750 mg 100 mL/hr over 90 Minutes Intravenous  Once 08/09/20 2339 08/09/20 2351   08/09/20 2300  remdesivir 200 mg in sodium chloride 0.9% 250 mL IVPB       "Followed by" Linked Group Details   200 mg 580 mL/hr over 30 Minutes Intravenous Once 08/09/20 2236 08/10/20 0201   08/09/20 2300  azithromycin (ZITHROMAX) 500 mg in sodium chloride 0.9 % 250 mL IVPB        500 mg 250 mL/hr over 60 Minutes Intravenous Every 24 hours 08/10/20 0859 08/12/20 2259   08/09/20 2230  azithromycin (ZITHROMAX) 500 mg in sodium chloride 0.9 % 250 mL IVPB        500 mg 250 mL/hr over 60 Minutes Intravenous  Once 08/09/20 2227 08/10/20 0038     MEDICATIONS   Scheduled Meds: . allopurinol  300 mg Per Tube Daily  . cetirizine  10 mg Oral Daily  . chlorhexidine gluconate (MEDLINE KIT)  15 mL Mouth Rinse BID  . Chlorhexidine Gluconate Cloth  6 each Topical Daily  . clonazePAM  2 mg Per Tube BID  . cloNIDine  0.2 mg Per Tube BID  . dexamethasone  6 mg Oral Daily  . enoxaparin (LOVENOX) injection  0.5 mg/kg Subcutaneous Q2200  . famotidine  20 mg Oral BID  . feeding supplement (PROSource TF)  90 mL Per Tube TID  . free water  200 mL Per Tube Q4H  . insulin aspart  0-20 Units Subcutaneous Q4H  . insulin aspart  4 Units  Subcutaneous Q4H  . mouth rinse  15 mL Mouth Rinse 10 times per day  . melatonin  5 mg Per Tube QHS  . metoprolol tartrate  5 mg Intravenous Once  . metoprolol tartrate  25 mg Oral BID  . QUEtiapine  50 mg Per Tube BID  . sennosides  10 mL Per Tube BID  . sodium chloride flush  10-40 mL Intracatheter Q12H   Continuous Infusions: . sodium chloride 5 mL/hr at 08/20/20 1700  . acetaminophen Stopped (08/20/20 1608)  . feeding supplement (VITAL 1.5 CAL) 65 mL/hr at 08/20/20 0500  . fentaNYL infusion INTRAVENOUS 350 mcg/hr (08/20/20 1700)  . midazolam 10 mg/hr (08/20/20 1700)   PRN Meds:.sodium chloride, acetaminophen, albuterol, fentaNYL, midazolam, ondansetron **OR** ondansetron (ZOFRAN) IV, sodium chloride flush, vecuronium   ALLERGIES   Amoxicillin  REVIEW OF SYSTEMS   Unable to obtain due to sedation, intubation, and critical illness  PHYSICAL EXAMINATION   Vital Signs: Temp:  [97.34 F (36.3 C)-101.66 F (38.7 C)] 98.96 F (37.2 C) (01/01 0600) Pulse Rate:  [71-152] 95 (01/01 0600) Resp:  [20-29] 29 (01/01 0600) BP: (94-174)/(52-72) 109/56 (01/01 0600) SpO2:  [88 %-99 %] 98 % (01/01 0800) FiO2 (%):  [50 %-75 %] 75 % (01/01 0800) Weight:  [110.8 kg] 110.8 kg (01/01 0500)   Vent Mode: PRVC FiO2 (%):  [65 %-75 %] 65 % Set Rate:  [24 bmp-26 bmp] 26 bmp Vt Set:  [550 mL] 550 mL PEEP:  [8 cmH20] 8 cmH20 Plateau Pressure:  [27 cmH20] 27 cmH20  Intake/Output: 12/31 0701 - 01/01 0700 In: 3441.2 [I.V.:1177.1; NG/GT:1514.9; IV Piggyback:749.1] Out: 2760 [Urine:1910; Stool:850]    GENERAL:Critically ill appearing male, laying in bed, intubated and  sedated, in NAD HEAD: Atraumatic, normocephalic,  EYES: Pupils PERRL, no scleral icterus MOUTH: Moist mucus membranes, ETT in place NECK: neck supple, no JVD no thyromegaly PULMONARY: Coarse breath sounds bilaterally, no wheezing or rhonchi, synchronouns with vent, even CARDIOVASCULAR: Regular rate and rhythm, s1s2, no M/R/G,  2+ distal pulses GASTROINTESTINAL: Soft, nontender, nondistended, no guarding or rebound tenderness, BS+ x4 MUSCULOSKELETAL: No deformities, no edema NEUROLOGIC: Sedated (gets very agitated when sedation weaned) SKIN: Warm and dry.  No obvious rashes, lesions, or ulcerations   PERTINENT DATA   LAB RESULTS:  Basic Metabolic Panel: Recent Labs  Lab 08/16/20 0749 08/17/20 0719 08/18/20 0707 08/19/20 0609 08/20/20 0503  NA 144 145 146* 146* 141  K 4.6 4.0 3.9 4.0 4.0  CL 104 100 98 106 104  CO2 31 37* 39* 32 31  GLUCOSE 212* 245* 254* 199* 265*  BUN 16 21* 33* 27* 20  CREATININE 0.43* 0.40* 0.63 0.52* 0.47*  CALCIUM 8.4* 8.9 8.9 8.8* 8.8*  MG 2.3 2.1 2.0 2.0 1.9  PHOS 2.0* 1.8* 3.5 2.7 3.3   Liver Function Tests: Recent Labs  Lab 08/14/20 0539  AST 17  ALT 17  ALKPHOS 34*  BILITOT 0.7  PROT 6.1*  ALBUMIN 2.8*   No results for input(s): LIPASE, AMYLASE in the last 168 hours. No results for input(s): AMMONIA in the last 168 hours. CBC: Recent Labs  Lab 08/14/20 0539 08/15/20 0508 08/16/20 0749 08/18/20 0851 08/19/20 0609 08/20/20 0503  WBC 7.4 7.9 9.5 8.3 7.3 8.5  NEUTROABS 6.8  --   --  7.5 6.1 7.4  HGB 9.8* 9.0* 10.2* 9.7* 9.5* 10.1*  HCT 31.7* 30.5* 33.7* 32.1* 32.1* 33.6*  MCV 96.6 97.8 97.1 95.5 96.7 96.8  PLT 152 147* 164 205 185 165   Cardiac Enzymes: No results for input(s): CKTOTAL, CKMB, CKMBINDEX, TROPONINI in the last 168 hours. BNP: Invalid input(s): POCBNP CBG: Recent Labs  Lab 08/19/20 1533 08/19/20 2002 08/19/20 2301 08/20/20 0336 08/20/20 0809  GLUCAP 125* 102* 249* 245* 260*    IMAGING RESULTS:   Imaging: DG Chest Port 1 View  Result Date: 08/20/2020 CLINICAL DATA:  Acute respiratory failure with hypoxia EXAM: PORTABLE CHEST 1 VIEW COMPARISON:  Chest radiograph from one day prior. FINDINGS: Endotracheal tube tip is 3.8 cm above the carina. Enteric tube terminates in the gastric fundus. Stable cardiomediastinal silhouette with  normal heart size. No pneumothorax. No pleural effusion. Extensive patchy consolidation throughout both lungs, worsened. IMPRESSION: 1. Well-positioned support structures. 2. Extensive patchy consolidation throughout both lungs, worsened, compatible with multilobar pneumonia. Electronically Signed   By: Ilona Sorrel M.D.   On: 08/20/2020 07:36   DG Chest Port 1 View  Result Date: 08/19/2020 CLINICAL DATA:  Acute respiratory failure, hypoxia EXAM: PORTABLE CHEST 1 VIEW COMPARISON:  The 08/15/2020 FINDINGS: Endotracheal tube is seen 4.5 cm above the carina. Nasogastric tube extends into the left upper quadrant of the abdomen with its tip within the expected gastric fundus. The lungs are well expanded and pulmonary insufflation is preserved. Extensive, diffuse, patchy pulmonary infiltrate is again identified and appears slightly progressive since prior examination, likely infectious or inflammatory. No pneumothorax or pleural effusion. Cardiac size within normal limits. IMPRESSION: Stable support tubes. Progressive, extensive infectious or inflammatory pulmonary infiltrate. Electronically Signed   By: Fidela Salisbury MD   On: 08/19/2020 05:07   _0 @ DG Chest Port 1 View  Result Date: 08/20/2020 CLINICAL DATA:  Acute respiratory failure with hypoxia EXAM: PORTABLE CHEST 1 VIEW COMPARISON:  Chest radiograph from one day prior. FINDINGS: Endotracheal tube tip is 3.8 cm above the carina. Enteric tube terminates in the gastric fundus. Stable cardiomediastinal silhouette with normal heart size. No pneumothorax. No pleural effusion. Extensive patchy consolidation throughout both lungs, worsened. IMPRESSION: 1. Well-positioned support structures. 2. Extensive patchy consolidation throughout both lungs, worsened, compatible with multilobar pneumonia. Electronically Signed   By: Ilona Sorrel M.D.   On: 08/20/2020 07:36     ASSESSMENT AND PLAN   Acute Hypoxic Respiratory Failure in the setting of COVID-19  Pneumonia with severe ARDS -Mechanical ventilation, prone positioning -Wean PEEP and FiO2 as able to maintain O2 sats >88% -Goal plateau pressure less than 30, driving pressure less than 15 -Paralytics if necessary for vent synchrony, gas exchange -Cycle prone positioning if necessary for oxygenation -Deep sedation per PAD protocol, goal RASS -4, currently fentanyl, midazolam -Diuresis as blood pressure and renal function can tolerate, goal CVP 5-8. -Check BNP -VAP prevention order set -Steroids -Follow inflammatory markers: Ferritin, D-dimer, CRP, IL-6, LDH -Vitamin C, zinc -Started dexamethasone suspect "rebound phenomenon " -Continue recruitment maneuvers    COVID-19 Pneumonia Query VAP, cultures negative so far -Monitor fever curve -Trend WBC's -Follow cultures -Discontinued Zosyn and Zyvox    Metabolic Alkalosis, likely in setting of aggressive diuresis with Lasix>>improved -Monitor I&O's / urinary output -Follow BMP -Ensure adequate renal perfusion -Avoid nephrotoxic agents as able -Replace electrolytes as indicated   Acute Metabolic Encephalopathy -Maintain a RASS of -3 to -4 -Fentanyl and versed gtts to maintain RASS goal -Continue Seroquel to assist with weaning sedation and agitation  -Daily wake up assessments -Provide supportive care   Anemia without s/sx of bleeding -Monitor for S/Sx of bleeding -Trend CBC -Lovenox for VTE Prophylaxis  -Transfuse for Hgb <7  Tachycardia Beta-blockers Continue monitoring   Perforated diverticulitis   - s/p colostomy     - Reviewed with General Surgery and says okay to prone with colostomy    GI/Nutrition -Tube feeds -Constipation protocol as indicated   Hyperglycemia -CBG's -SSI -Follow ICU Hypo/Hyperglycemia protocol    BEST PRACTICES DISPOSITION: ICU GOALS OF CARE: Full code SUP: Pepcid  VTE PROPHYLAXIS: Lovenox UPDATES: Updated pt's significant other [name] Breashears via telephone  08/20/2020   Critical care provider statement:  The patient is critically ill with multiple organ systems failure and requires high complexity decision making for assessment and support, frequent evaluation and titration of therapies, application of advanced monitoring technologies and extensive interpretation of multiple databases. Critical Care Time devoted to patient care services described in this note is 50 minutes.      Renold Don, MD [facility]   *This note was dictated using voice recognition software/Dragon.  Despite best efforts to proofread, errors can occur which can change the meaning.  Any change was purely unintentional.

## 2020-08-21 ENCOUNTER — Inpatient Hospital Stay: Payer: No Typology Code available for payment source

## 2020-08-21 DIAGNOSIS — J9601 Acute respiratory failure with hypoxia: Secondary | ICD-10-CM | POA: Diagnosis not present

## 2020-08-21 DIAGNOSIS — U071 COVID-19: Secondary | ICD-10-CM | POA: Diagnosis not present

## 2020-08-21 LAB — BASIC METABOLIC PANEL
Anion gap: 9 (ref 5–15)
BUN: 18 mg/dL (ref 6–20)
CO2: 32 mmol/L (ref 22–32)
Calcium: 8.8 mg/dL — ABNORMAL LOW (ref 8.9–10.3)
Chloride: 99 mmol/L (ref 98–111)
Creatinine, Ser: 0.49 mg/dL — ABNORMAL LOW (ref 0.61–1.24)
GFR, Estimated: >60 mL/min (ref >60)
Glucose, Bld: 362 mg/dL — ABNORMAL HIGH (ref 70–99)
Potassium: 4.8 mmol/L (ref 3.5–5.1)
Sodium: 140 mmol/L (ref 135–145)

## 2020-08-21 LAB — GLUCOSE, CAPILLARY
Glucose-Capillary: 344 mg/dL — ABNORMAL HIGH (ref 70–99)
Glucose-Capillary: 356 mg/dL — ABNORMAL HIGH (ref 70–99)
Glucose-Capillary: 381 mg/dL — ABNORMAL HIGH (ref 70–99)
Glucose-Capillary: 318 mg/dL — ABNORMAL HIGH (ref 70–99)
Glucose-Capillary: 325 mg/dL — ABNORMAL HIGH (ref 70–99)
Glucose-Capillary: 242 mg/dL — ABNORMAL HIGH (ref 70–99)
Glucose-Capillary: 260 mg/dL — ABNORMAL HIGH (ref 70–99)
Glucose-Capillary: 284 mg/dL — ABNORMAL HIGH (ref 70–99)
Glucose-Capillary: 211 mg/dL — ABNORMAL HIGH (ref 70–99)
Glucose-Capillary: 249 mg/dL — ABNORMAL HIGH (ref 70–99)
Glucose-Capillary: 181 mg/dL — ABNORMAL HIGH (ref 70–99)
Glucose-Capillary: 187 mg/dL — ABNORMAL HIGH (ref 70–99)
Glucose-Capillary: 243 mg/dL — ABNORMAL HIGH (ref 70–99)
Glucose-Capillary: 258 mg/dL — ABNORMAL HIGH (ref 70–99)
Glucose-Capillary: 218 mg/dL — ABNORMAL HIGH (ref 70–99)
Glucose-Capillary: 225 mg/dL — ABNORMAL HIGH (ref 70–99)
Glucose-Capillary: 255 mg/dL — ABNORMAL HIGH (ref 70–99)
Glucose-Capillary: 235 mg/dL — ABNORMAL HIGH (ref 70–99)
Glucose-Capillary: 215 mg/dL — ABNORMAL HIGH (ref 70–99)

## 2020-08-21 LAB — CBC WITH DIFFERENTIAL/PLATELET
Abs Immature Granulocytes: 0.22 10*3/uL — ABNORMAL HIGH (ref 0.00–0.07)
Basophils Absolute: 0 10*3/uL (ref 0.0–0.1)
Basophils Relative: 0 %
Eosinophils Absolute: 0 10*3/uL (ref 0.0–0.5)
Eosinophils Relative: 0 %
HCT: 31.1 % — ABNORMAL LOW (ref 39.0–52.0)
Hemoglobin: 9.4 g/dL — ABNORMAL LOW (ref 13.0–17.0)
Immature Granulocytes: 2 %
Lymphocytes Relative: 3 %
Lymphs Abs: 0.3 10*3/uL — ABNORMAL LOW (ref 0.7–4.0)
MCH: 28.9 pg (ref 26.0–34.0)
MCHC: 30.2 g/dL (ref 30.0–36.0)
MCV: 95.7 fL (ref 80.0–100.0)
Monocytes Absolute: 0.2 10*3/uL (ref 0.1–1.0)
Monocytes Relative: 2 %
Neutro Abs: 10.9 10*3/uL — ABNORMAL HIGH (ref 1.7–7.7)
Neutrophils Relative %: 93 %
Platelets: 176 10*3/uL (ref 150–400)
RBC: 3.25 MIL/uL — ABNORMAL LOW (ref 4.22–5.81)
RDW: 15.4 % (ref 11.5–15.5)
WBC: 11.7 10*3/uL — ABNORMAL HIGH (ref 4.0–10.5)
nRBC: 0 % (ref 0.0–0.2)

## 2020-08-21 LAB — BLOOD GAS, ARTERIAL
Acid-Base Excess: 7.8 mmol/L — ABNORMAL HIGH (ref 0.0–2.0)
Bicarbonate: 35.1 mmol/L — ABNORMAL HIGH (ref 20.0–28.0)
FIO2: 0.6
MECHVT: 500 mL
O2 Saturation: 93.7 %
PEEP: 5 cmH2O
Patient temperature: 37
RATE: 30 resp/min
pCO2 arterial: 65 mmHg — ABNORMAL HIGH (ref 32.0–48.0)
pH, Arterial: 7.34 — ABNORMAL LOW (ref 7.350–7.450)
pO2, Arterial: 74 mmHg — ABNORMAL LOW (ref 83.0–108.0)

## 2020-08-21 LAB — TROPONIN I (HIGH SENSITIVITY)
Troponin I (High Sensitivity): 26 ng/L — ABNORMAL HIGH (ref <18)
Troponin I (High Sensitivity): 15 ng/L (ref <18)
Troponin I (High Sensitivity): 13 ng/L (ref <18)

## 2020-08-21 LAB — MAGNESIUM: Magnesium: 2 mg/dL (ref 1.7–2.4)

## 2020-08-21 LAB — PHOSPHORUS: Phosphorus: 1.8 mg/dL — ABNORMAL LOW (ref 2.5–4.6)

## 2020-08-21 IMAGING — DX DG CHEST 1V
1 series · 1 of 1 positions shown · non-contrast
Comparison: [DATE]

CLINICAL DATA: Respiratory failure secondary to [3N] pneumonia.

EXAM:
CHEST  1 VIEW

[chest ap]
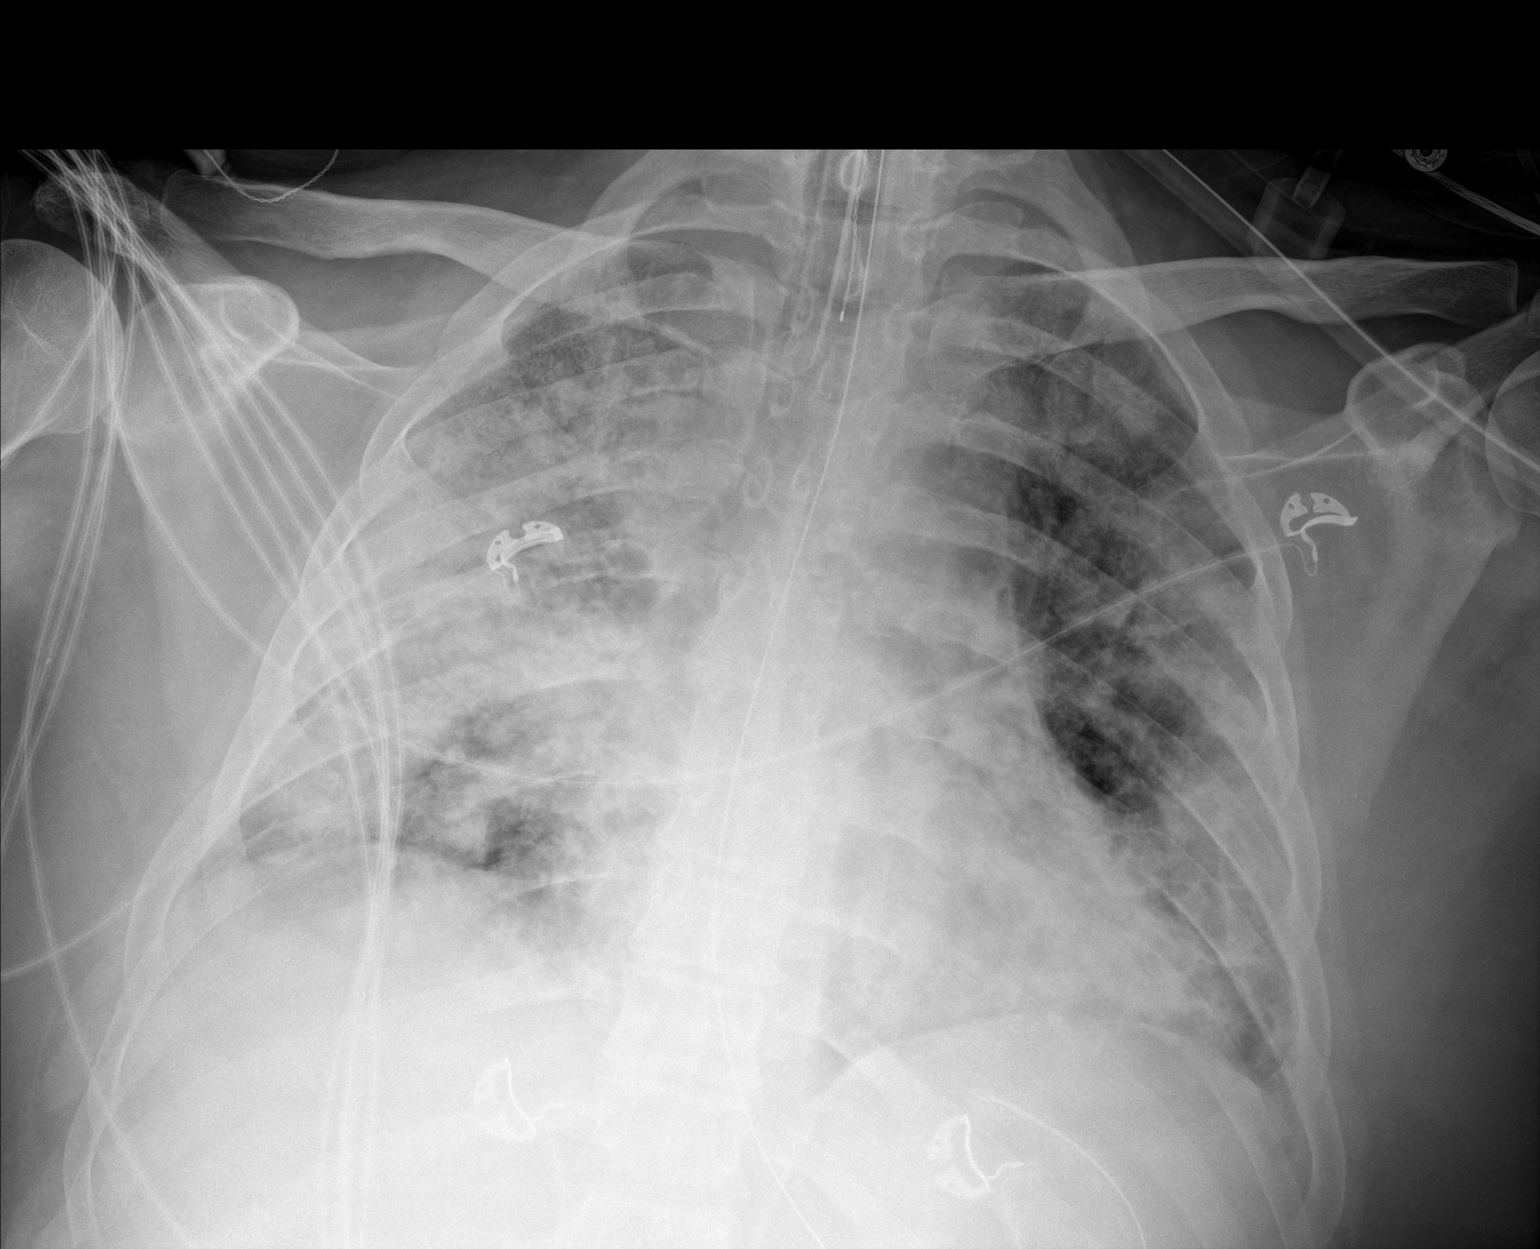

[1 of 1 positions shown; findings below may reference images not displayed]

FINDINGS: Endotracheal tube tip approximately 3.5 cm above the carina. Gastric
decompression tube positioning stable. The heart size and
mediastinal contours are within normal limits. Overall pulmonary
aeration slightly improved in both upper lung zones. Severe residual
bilateral airspace disease remains present. No pneumothorax or
significant pleural fluid. The visualized skeletal structures are
unremarkable.
IMPRESSION: Slightly improved aeration in both upper lung zones.

## 2020-08-21 MED ORDER — INSULIN ASPART 100 UNIT/ML ~~LOC~~ SOLN
8.0000 [IU] | SUBCUTANEOUS | Status: DC
  Administered 2020-08-21: 8 [IU] via SUBCUTANEOUS
  Filled 2020-08-21: qty 1

## 2020-08-21 MED ORDER — INSULIN DETEMIR 100 UNIT/ML ~~LOC~~ SOLN
12.0000 [IU] | Freq: Two times a day (BID) | SUBCUTANEOUS | Status: DC
  Filled 2020-08-21: qty 0.12

## 2020-08-21 MED ORDER — VECURONIUM BROMIDE 10 MG IV SOLR
10.0000 mg | INTRAVENOUS | Status: DC | PRN
  Administered 2020-08-23 – 2020-09-06 (×13): 10 mg via INTRAVENOUS
  Filled 2020-08-21 (×16): qty 10

## 2020-08-21 MED ORDER — INSULIN REGULAR(HUMAN) IN NACL 100-0.9 UT/100ML-% IV SOLN
INTRAVENOUS | Status: DC
  Administered 2020-08-21: 14 [IU]/h via INTRAVENOUS
  Administered 2020-08-21: 10.5 [IU]/h via INTRAVENOUS
  Administered 2020-08-21: 20 [IU]/h via INTRAVENOUS
  Administered 2020-08-22: 22 [IU]/h via INTRAVENOUS
  Administered 2020-08-22: 21 [IU]/h via INTRAVENOUS
  Administered 2020-08-23: 9.5 [IU]/h via INTRAVENOUS
  Administered 2020-08-24: 10.5 [IU]/h via INTRAVENOUS
  Filled 2020-08-21 (×13): qty 100

## 2020-08-21 MED ORDER — INSULIN ASPART 100 UNIT/ML ~~LOC~~ SOLN
2.0000 [IU] | Freq: Once | SUBCUTANEOUS | Status: AC
  Administered 2020-08-21: 2 [IU] via SUBCUTANEOUS

## 2020-08-21 MED ORDER — DEXTROSE 50 % IV SOLN: 0.0000 mL | INTRAVENOUS | Status: DC | PRN

## 2020-08-21 MED ORDER — K PHOS MONO-SOD PHOS DI & MONO 155-852-130 MG PO TABS
250.0000 mg | ORAL_TABLET | Freq: Three times a day (TID) | ORAL | Status: AC
  Administered 2020-08-21 (×3): 250 mg via ORAL
  Filled 2020-08-21 (×5): qty 1

## 2020-08-21 MED ORDER — INSULIN ASPART 100 UNIT/ML ~~LOC~~ SOLN: 6.0000 [IU] | SUBCUTANEOUS | Status: DC

## 2020-08-21 NOTE — Progress Notes (Signed)
NAME:  Craig [name], MRN:  [id number], DOB:  Jun 20, 1991, LOS: 12 ADMISSION DATE:  08/09/2020, CONSULTATION DATE:  08/10/2020 CHIEF COMPLAINT: Dizziness & Fever  Brief History:  30 yo male recent hospitalization for perforated diverticulitis s/p colostomy with reversal and leakage of anastomosis with s/p revision now acutely hypoxemic with COVID19 induced severe ARDS requiring intubation and mechanical ventilation.  Past Medical History:  Hypertension Gout Perforated diverticulitis s/p colostomy  Significant Hospital Events:  08/11/20-patient continued to decline with worsening respiratory status despite 100%Fio2 on BIPAP and HFNC failure.  He stated that he feels he is dying and cannot breathe.  I discussed with wife [name] earlier today that patient is critically ill may need ETT, she is agreeable and thankful for care. I specifically discussed and explained intubation and mechanical ventilation to patient and he wishes to proceed.  ETT placed 08/12/20- patient was weaned on FiO2 to 60%. Spoke to wife [name].  08/13/20- Patient weaned to 40%.  During SBT today he self extubated and was placed on HFNC.  He subsequently became hypoxemic, disoriented aggitated, removed PIVs and colostomy and proceeded to have combative behavior with worsening oxygenation. I have attempted to calm patient and encouraged him to cooperate.  After numerous attempts patient continued to be combative with severe aggitation with combative behavior and would not wear oxygen. Patient was placed on sedation and MV.  Wife updated.   08/14/20- patient remains critically ill.  Weaned from 100%>>55%. Called and updated wife [name] today 12/27-Patient with worsening oxygenation on FiO2 100% peep of 14 08/16/20-Patient with continued high oxygen requirements overnight. Have spoken with surgery and there should be no problem proning patient with this. Was febrile yesterday and thus broad spectrum abx started and  cultures attained.  08-17-20-patient down to PEEP 10 FiO2 55%.  Patient never required proning 08/18/20: FiO2 weaned to 40%, PEEP 10, plan for diuresis with Diamox today, adding PO Clonazepam to assist weaning sedation 08/19/20: Vent changed to Pressure Control this morning: 50% FiO2, 24, 22/5, Plan for Recruitment maneuvers and diuresis 08/20/2020: Continued issues with hypercapnia due to the dead space ventilation.  Worsening despite pressure control.  Switch back to [facility], prone positioning  Consults:    Procedures:  08/11/2020 ETT >> 08/13/2020 08/13/2020 ETT >> 08/20/2020 Arterial Line >>  Significant Diagnostic Tests:  08/17/2020 Echocardiogram >> LVEF 70 - 75%, no wall motion abnormalities, no LVH. RV is mildly enlarged, RV systolic function normal. Trivial TR & MR, all else WNL.  Micro Data:  08/09/20 COVID-19 >> positive 08/09/2020 influenza A/B >> negative 08/09/2020 MRSA PCR >> negative 08/10/2020 urine culture >> negative 08/09/2020 BC x 2 >> negative 08/15/2020 tracheal aspirate >> negative 08/15/2020 BC x 2 >> negative  Antimicrobials:  08/09/2020 azithromycin >> 08/12/2020 08/09/2020 remdesivir >> 08/14/2020 08/10/2020 ceftriaxone >> 08/15/2020 08/15/2020 vancomycin x1 08/15/2020 Zosyn >> 08/20/2020 08/16/2020 Linezolid >> 08/19/2020  Interim History / Subjective:  Patient prone, hemodynamically stable, SPO2 greater than 95% on 60% FiO2. Arterial line placed overnight Suspected ST elevation, 12-lead performed while prone showed ST elevation in inferior and lateral leads.  However troponin unimpressive at 32, with repeat at 26.  Labs/ Imaging personally reviewed Net: -1.5 L (-1.6 L since admit) Na+/ K+: 140/4.8 BUN/Cr.:  18/0.49 Hgb: 9.4 Phos: 1.8 -replaced  WBC/ TMAX: 11.7/38.7  ABG 08/21/2020: 7.34/ 65/ 74/ 35.1 (improved) CXR 08/21/2020: Pending once turned supine Objective   Blood pressure (!) 141/60, pulse 90, temperature 98.78 F (37.1 C), temperature  source Esophageal, resp. rate (!) 30,  height 6' (1.829 m), weight 110.8 kg, SpO2 99 %.    Vent Mode: PRVC FiO2 (%):  [60 %-75 %] 60 % Set Rate:  [26 bmp-30 bmp] 30 bmp Vt Set:  [550 mL] 550 mL PEEP:  [5 cmH20-8 cmH20] 5 cmH20 Plateau Pressure:  [25 cmH20-28 cmH20] 25 cmH20   Intake/Output Summary (Last 24 hours) at 08/21/2020 0455 Last data filed at 08/21/2020 0345 Gross per 24 hour  Intake 1255.08 ml  Output 2675 ml  Net -1419.92 ml   Filed Weights   08/09/20 2036 08/20/20 0500  Weight: 117.9 kg 110.8 kg    Examination: General: Adult male, critically ill, lying in bed intubated & sedated requiring mechanical ventilation HEENT: MM pink/moist, anicteric, atraumatic, neck supple, edema present from prone position Neuro: Sedated with RASS goal of -5 while prone (UTA) PERRL (UTA) CV: s1s2 RRR, NSR on monitor, no r/m/g Pulm: Regular, non labored on PRVC: 60% FiO2/ PEEP 5, breath sounds diminished throughout GI: deferred in prone position-ostomy in place GU: foley in place with clear yellow urine Skin: no rashes/lesions noted Extremities: warm/dry, pulses + 2 R/P,  Trace generalized edema noted  Resolved Hospital Problem list     Assessment & Plan:  Acute Hypoxic Respiratory Failure secondary to COVID-19 Bacterial Pneumonia  Severe ARDS  Completed 5 days of remdesivir, completed 9-day course of Decadron 6 mg daily.  Completed multiple antibiotics (see above).  PCT: 0.92 > 1.22 > 0.94.  Continued fevers, all cultures negative. - Ventilator settings: PRVC  7 mL/kg, FiO2: 60%, PEEP 5, continue lung protective strategies  - Wean PEEP & FiO2 as tolerated to maintain O2 sats >88% - Goal plateau pressure < 30, driving pressure < 15 - Consider continuous paralytics if needed for vent synchrony/gas exchange - Cycle prone positioning if P/F ratio < 100 for oxygenation - Deep sedation per PAD protocol, goal RASS -4, -5: Fentanyl & Midazolam drips - Vecuronium IVP Q 1h PRN for ventilator  asynchrony - Diuresis as tolerated  - Decadron 6 mg Q 24 h restarted 08/20/20, rebound phenomenon suspected, will follow with taper when appropriate - continue Vitamin C & zinc - Intermittent CXR & ABG PRN -Monitor WBC/fever curve -Culture as needed -Continue recruitment maneuvers as needed -Bronchodilators as needed  Metabolic alkalosis, likely in setting of aggressive diuresis with Lasix >> improved - Strict I/O's: alert provider if UOP < 0.5 mL/kg/hr - Daily BMP, replace electrolytes PRN - Avoid nephrotoxic agents as able, ensure adequate renal perfusion  Acute metabolic encephalopathy -Maintain RASS goal -4 (while utilizing prone position) -PAD protocol with fentanyl and Versed drips to maintain RASS -Continue Seroquel to assist with weaning sedation and agitation -Continue melatonin 5 mg nightly -Continue clonazepam twice daily to assist with weaning sedation and agitation -Daily wake-up assessments -Provide supportive care  Anemia without s/sx of bleeding - Monitor for s/s of bleeding - Daily CBC - Transfuse for Hgb <7 -Continue Lovenox for VTE prophylaxis  Tachycardia PMHx: HTN Echocardiogram without acute abnormality.  Suspected ST elevation: 08/20/20, troponin unimpressive: 32 ~ 26. BNP normal at 46.9 -Continue Lopressor 25 mg twice daily -Continuous cardiac monitoring -Continue clonidine twice daily  Hyperglycemia in the setting of high-dose steroids A1c 5.7 suggesting prediabetes, while on steroids CBG has remained elevated > 250.  Overnight 08/20/2020 to 08/21/2020 sugars have remained increasingly elevated, high of 350. -Diabetes coordinator following, appreciate input -Monitor every 4, SSI resistant scale -Continue additional standing dose of insulin aspart 8 units -Levemir increased to 12 units twice daily -Consider  insulin drip if 8 AM CBG check remains greater than 300  Perforated diverticulitis s/p colostomy Reviewed with general surgery and okay to prone with  colostomy -Supportive care  Gout Spouse discussed concerns on 08/20/2020 that patient might be experiencing a gout flare.  Uric acid level checked, low at 1.2 -Continue home regimen: allopurinol daily  Best practice (evaluated daily)  Diet: N.p.o. with tube feeds Pain/Anxiety/Delirium protocol (if indicated): Fentanyl and Versed drips VAP protocol (if indicated): Established DVT prophylaxis: Lovenox SQ GI prophylaxis: Pepcid Glucose control: Monitor every 4 Mobility: Bedrest, mobilize as tolerated Disposition: ICU  Goals of Care:  Last date of multidisciplinary goals of care discussion: 08/20/2020 Family and staff present: Patient's spouse Lance Sell updated via phone by MD Summary of discussion: Discussed plan of care-prone positioning/arterial line placement/fevers Follow up goals of care discussion due: 08/21/2020-Will update during day as needed Code Status: Full  Labs   CBC: Recent Labs  Lab 08/14/20 0539 08/15/20 0508 08/16/20 0749 08/18/20 0851 08/19/20 0609 08/20/20 0503 08/21/20 0033  WBC 7.4   < > 9.5 8.3 7.3 8.5 11.7*  NEUTROABS 6.8  --   --  7.5 6.1 7.4 10.9*  HGB 9.8*   < > 10.2* 9.7* 9.5* 10.1* 9.4*  HCT 31.7*   < > 33.7* 32.1* 32.1* 33.6* 31.1*  MCV 96.6   < > 97.1 95.5 96.7 96.8 95.7  PLT 152   < > 164 205 185 165 176   < > = values in this interval not displayed.    Basic Metabolic Panel: Recent Labs  Lab 08/17/20 0719 08/18/20 0707 08/19/20 0609 08/20/20 0503 08/21/20 0033  NA 145 146* 146* 141 140  K 4.0 3.9 4.0 4.0 4.8  CL 100 98 106 104 99  CO2 37* 39* 32 31 32  GLUCOSE 245* 254* 199* 265* 362*  BUN 21* 33* 27* 20 18  CREATININE 0.40* 0.63 0.52* 0.47* 0.49*  CALCIUM 8.9 8.9 8.8* 8.8* 8.8*  MG 2.1 2.0 2.0 1.9 2.0  PHOS 1.8* 3.5 2.7 3.3 1.8*   GFR: Estimated Creatinine Clearance: 172 mL/min (A) (by C-G formula based on SCr of 0.49 mg/dL (L)). Recent Labs  Lab 08/18/20 0851 08/19/20 0609 08/20/20 0503 08/21/20 0033  WBC 8.3 7.3  8.5 11.7*    Liver Function Tests: Recent Labs  Lab 08/14/20 0539  AST 17  ALT 17  ALKPHOS 34*  BILITOT 0.7  PROT 6.1*  ALBUMIN 2.8*   No results for input(s): LIPASE, AMYLASE in the last 168 hours. No results for input(s): AMMONIA in the last 168 hours.  ABG    Component Value Date/Time   PHART 7.34 (L) 08/21/2020 0021   PCO2ART 65 (H) 08/21/2020 0021   PO2ART 74 (L) 08/21/2020 0021   HCO3 35.1 (H) 08/21/2020 0021   O2SAT 93.7 08/21/2020 0021     Coagulation Profile: No results for input(s): INR, PROTIME in the last 168 hours.  Cardiac Enzymes: No results for input(s): CKTOTAL, CKMB, CKMBINDEX, TROPONINI in the last 168 hours.  HbA1C: Hgb A1c MFr Bld  Date/Time Value Ref Range Status  08/10/2020 04:33 AM 5.7 (H) 4.8 - 5.6 % Final    Comment:    (NOTE) Pre diabetes:          5.7%-6.4%  Diabetes:              >6.4%  Glycemic control for   <7.0% adults with diabetes     CBG: Recent Labs  Lab 08/20/20 1146 08/20/20 1626 08/20/20 2018  08/20/20 2357 08/21/20 0321  GLUCAP 224* 120* 291* 356* 344*    Critical care time: 45 minutes       Betsey Holiday, AGACNP-BC Acute Care Nurse Practitioner [facility]   [phone] / [phone] Please see Amion for pager details.

## 2020-08-21 NOTE — Progress Notes (Signed)
Pt placed in supine position. Recruitment Maneuver completed post transition. Pt tolerated both well.

## 2020-08-21 NOTE — Procedures (Signed)
Arterial Catheter Insertion Procedure Note  Craig [name]  [id number]  03/20/1991  Date:08/21/20  Time:12:22 AM    Provider Performing: Cecelia Byars [place]    Procedure: Insertion of Arterial Line (89169) with US guidance (45038)   Indication(s) Blood pressure monitoring and/or need for frequent ABGs  Consent Risks of the procedure as well as the alternatives and risks of each were explained to the patient and/or caregiver.  Consent for the procedure was obtained and is signed in the bedside chart. Consent obtained from spouse [name].  Anesthesia None   Time Out Verified patient identification, verified procedure, site/side was marked, verified correct patient position, special equipment/implants available, medications/allergies/relevant history reviewed, required imaging and test results available.   Sterile Technique Maximal sterile technique including full sterile barrier drape, hand hygiene, sterile gown, sterile gloves, mask, hair covering, sterile ultrasound probe cover (if used).   Procedure Description Area of catheter insertion was cleaned with chlorhexidine and draped in sterile fashion. With real-time ultrasound guidance an arterial catheter was placed into the left radial artery.  Appropriate arterial tracings confirmed on monitor.     Complications/Tolerance None; patient tolerated the procedure well.   EBL Minimal   Specimen(s) None    Betsey Holiday, AGACNP-BC Acute Care Nurse Practitioner [facility]   [phone] / [phone] Please see Amion for pager details.

## 2020-08-22 ENCOUNTER — Inpatient Hospital Stay: Payer: No Typology Code available for payment source

## 2020-08-22 LAB — CBC WITH DIFFERENTIAL/PLATELET
Abs Immature Granulocytes: 0.28 10*3/uL — ABNORMAL HIGH (ref 0.00–0.07)
Basophils Absolute: 0 10*3/uL (ref 0.0–0.1)
Basophils Relative: 0 %
Eosinophils Absolute: 0 10*3/uL (ref 0.0–0.5)
Eosinophils Relative: 0 %
HCT: 29.3 % — ABNORMAL LOW (ref 39.0–52.0)
Hemoglobin: 8.8 g/dL — ABNORMAL LOW (ref 13.0–17.0)
Immature Granulocytes: 3 %
Lymphocytes Relative: 6 %
Lymphs Abs: 0.6 10*3/uL — ABNORMAL LOW (ref 0.7–4.0)
MCH: 28.5 pg (ref 26.0–34.0)
MCHC: 30 g/dL (ref 30.0–36.0)
MCV: 94.8 fL (ref 80.0–100.0)
Monocytes Absolute: 0.5 10*3/uL (ref 0.1–1.0)
Monocytes Relative: 5 %
Neutro Abs: 9.4 10*3/uL — ABNORMAL HIGH (ref 1.7–7.7)
Neutrophils Relative %: 86 %
Platelets: 217 10*3/uL (ref 150–400)
RBC: 3.09 MIL/uL — ABNORMAL LOW (ref 4.22–5.81)
RDW: 14.8 % (ref 11.5–15.5)
WBC: 10.8 10*3/uL — ABNORMAL HIGH (ref 4.0–10.5)
nRBC: 0.4 % — ABNORMAL HIGH (ref 0.0–0.2)

## 2020-08-22 LAB — GLUCOSE, CAPILLARY
Glucose-Capillary: 129 mg/dL — ABNORMAL HIGH (ref 70–99)
Glucose-Capillary: 135 mg/dL — ABNORMAL HIGH (ref 70–99)
Glucose-Capillary: 142 mg/dL — ABNORMAL HIGH (ref 70–99)
Glucose-Capillary: 142 mg/dL — ABNORMAL HIGH (ref 70–99)
Glucose-Capillary: 151 mg/dL — ABNORMAL HIGH (ref 70–99)
Glucose-Capillary: 156 mg/dL — ABNORMAL HIGH (ref 70–99)
Glucose-Capillary: 162 mg/dL — ABNORMAL HIGH (ref 70–99)
Glucose-Capillary: 164 mg/dL — ABNORMAL HIGH (ref 70–99)
Glucose-Capillary: 164 mg/dL — ABNORMAL HIGH (ref 70–99)
Glucose-Capillary: 170 mg/dL — ABNORMAL HIGH (ref 70–99)
Glucose-Capillary: 171 mg/dL — ABNORMAL HIGH (ref 70–99)
Glucose-Capillary: 173 mg/dL — ABNORMAL HIGH (ref 70–99)
Glucose-Capillary: 174 mg/dL — ABNORMAL HIGH (ref 70–99)
Glucose-Capillary: 180 mg/dL — ABNORMAL HIGH (ref 70–99)
Glucose-Capillary: 182 mg/dL — ABNORMAL HIGH (ref 70–99)
Glucose-Capillary: 186 mg/dL — ABNORMAL HIGH (ref 70–99)
Glucose-Capillary: 188 mg/dL — ABNORMAL HIGH (ref 70–99)
Glucose-Capillary: 188 mg/dL — ABNORMAL HIGH (ref 70–99)
Glucose-Capillary: 188 mg/dL — ABNORMAL HIGH (ref 70–99)

## 2020-08-22 LAB — BASIC METABOLIC PANEL
Anion gap: 6 (ref 5–15)
BUN: 24 mg/dL — ABNORMAL HIGH (ref 6–20)
CO2: 33 mmol/L — ABNORMAL HIGH (ref 22–32)
Calcium: 8.9 mg/dL (ref 8.9–10.3)
Chloride: 104 mmol/L (ref 98–111)
Creatinine, Ser: 0.39 mg/dL — ABNORMAL LOW (ref 0.61–1.24)
GFR, Estimated: >60 mL/min (ref >60)
Glucose, Bld: 213 mg/dL — ABNORMAL HIGH (ref 70–99)
Potassium: 4.2 mmol/L (ref 3.5–5.1)
Sodium: 143 mmol/L (ref 135–145)

## 2020-08-22 LAB — PHOSPHORUS: Phosphorus: 2.2 mg/dL — ABNORMAL LOW (ref 2.5–4.6)

## 2020-08-22 LAB — MAGNESIUM: Magnesium: 2 mg/dL (ref 1.7–2.4)

## 2020-08-22 IMAGING — DX DG CHEST 1V PORT
1 series · 1 of 1 positions shown · non-contrast
Comparison: [DATE]

CLINICAL DATA: Acute respiratory failure, COVID ARDS

EXAM:
PORTABLE CHEST 1 VIEW

[chest ap]
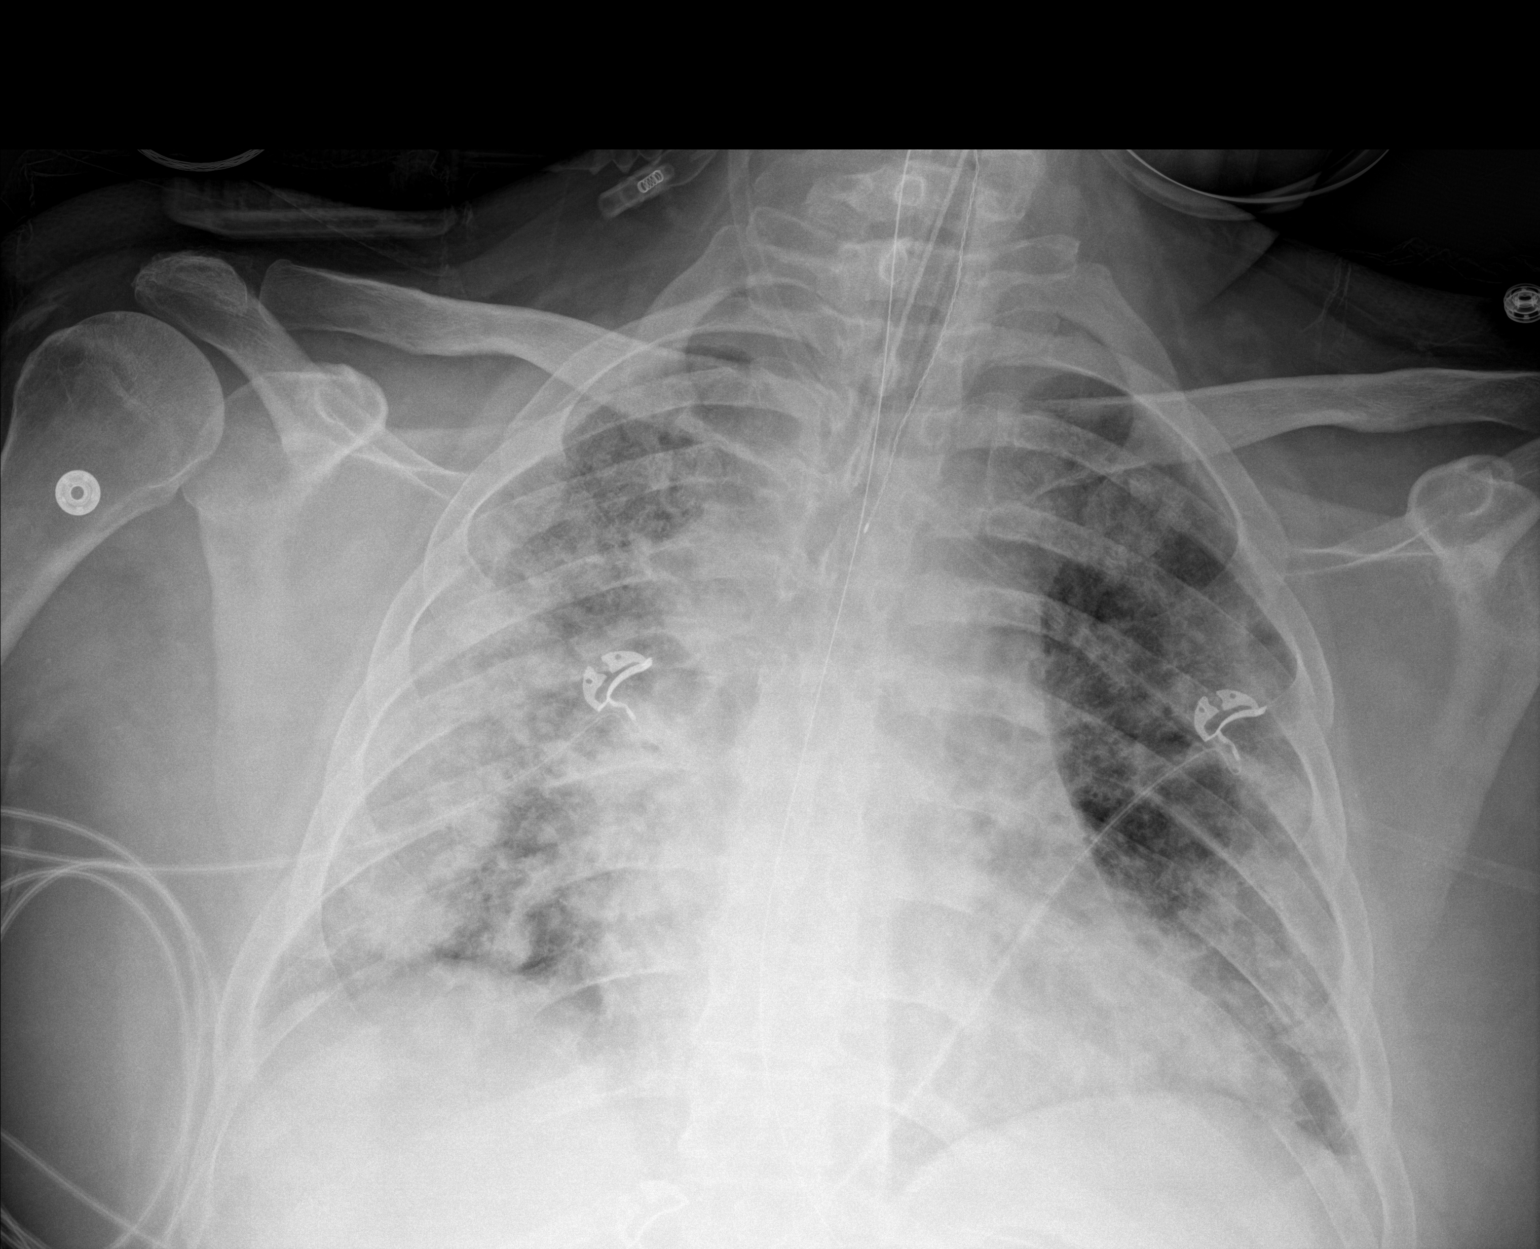

[1 of 1 positions shown; findings below may reference images not displayed]

FINDINGS: Endotracheal tube remains 4.3 cm above the carina. NG tube enters
the stomach with the tip not visualized. Esophageal temp probe noted
at the T4 level.

Slight improvement in the severe bilateral airspace process compared
to yesterday. No developing effusion or pneumothorax. Stable
cardiomegaly. No acute osseous finding.
IMPRESSION: Slight improvement in diffuse bilateral airspace process compatible
with COVID ARDS.

Stable support apparatus.

## 2020-08-22 MED ORDER — K PHOS MONO-SOD PHOS DI & MONO 155-852-130 MG PO TABS
250.0000 mg | ORAL_TABLET | Freq: Three times a day (TID) | ORAL | Status: AC
  Administered 2020-08-22 (×3): 250 mg
  Filled 2020-08-22 (×4): qty 1

## 2020-08-22 NOTE — Progress Notes (Signed)
Pt moved to supine position at 1230, with no complications. Will continue to monitor.

## 2020-08-22 NOTE — Progress Notes (Signed)
CRITICAL CARE PROGRESS NOTE    Name: Craig [name] MRN: [id number] DOB: [birth year]  Referring physician: Dr Craig [name]    LOS: [place]    Patient description:  30 yo male recent hospitalization for perforated diverticulitis s/p colostomy with reversal and leakage of anastomosis with s/p revision now acutely hypoxemic with COVID19 induced severe ARDS.   INTERVAL EVENTS: 08/11/20-patient continued to decline with worsening respiratory status despite 100%Fio2 on BIPAP and HFNC failure.  He stated that he feels he is dying and cannot breathe.  I discussed with wife Craig earlier today that patient is critically ill may need ETT, she is agreeable and thankful for care. I specifically discussed and explained intubation and mechanical ventilation to patient and he wishes to proceed.   08/12/20- patient was weaned on FiO2 to 60%. Spoke to wife Craig.  08/13/20- Patient weaned to 40%.  During SBT today he self extubated and was placed on HFNC.  He subsequently became hypoxemic, disoriented aggitated, removed PIVs and colostomy and proceeded to have combative behavior with worsening oxygenation. I have attempted to calm patient and encouraged him to cooperate.  After numerous attempts patient continued to be combative with severe aggitation with combative behavior and would not wear oxygen. Patient was placed on sedation and MV.  Wife updated.   08/14/20- patient remains critically ill.  Weaned from 100%>>55%. Called and updated wife Craig [name] today 12/27-Patient with worsening oxygenation on FiO2 100% peep of 14 08/16/20-Patient with continued high oxygen requirements overnight. Have spoken with surgery and there should be no problem proning patient with this. Was febrile yesterday and  thus broad spectrum abx started and cultures attained.  08-17-20-patient down to PEEP 10 FiO2 55%.  Patient never required proning 08/18/20: FiO2 weaned to 40%, PEEP 10, plan for diuresis with Diamox today, adding PO Clonazepam to assist weaning sedation 08/19/20: Vent changed to Pressure Control this morning: 50% FiO2, 24, 22/5, Plan for Recruitment maneuvers and diuresis 08/20/2020: Continued issues with hypercapnia due to the dead space ventilation.  Worsening despite pressure control.  Switch back to [facility] (2-Rh), prone positioning 08/23/19- patient proned today without incident, continue to wean FiO2 , currently on 50%  Lines/tubes : Closed System Drain 1 Right Abdomen Bulb (JP) 19 Fr. (Active)     Colostomy LLQ (Active)  Stoma Assessment Pink;Red 08/10/20 0944  Peristomal Assessment Intact 08/10/20 0944    Microbiology/Sepsis markers: Results for orders placed or performed during the hospital encounter of 08/09/20  Culture, blood (Routine X 2) w Reflex to ID Panel     Status: None   Collection Time: 08/09/20  9:06 PM   Specimen: BLOOD  Result Value Ref Range Status   Specimen Description BLOOD RIGHT ARM  Final   Special Requests   Final    BOTTLES DRAWN AEROBIC AND ANAEROBIC Blood Culture adequate volume   Culture   Final    NO GROWTH 5 DAYS Performed at [facility], [address], [place], [place] [address]    Report Status 08/15/2020 FINAL  Final  Culture, blood (Routine X 2) w Reflex to ID Panel     Status: None   Collection Time: 08/09/20  9:06 PM   Specimen: BLOOD  Result Value Ref Range Status   Specimen Description BLOOD RIGHT [facility]  Final   Special Requests   Final    BOTTLES DRAWN AEROBIC AND ANAEROBIC Blood Culture adequate volume   Culture   Final    NO GROWTH  5 DAYS Performed at [facility], [address] [place], [place] [address]    Report Status 08/15/2020 FINAL  Final  Resp Panel by RT-PCR (Flu A&B, Covid) Nasopharyngeal Swab     Status:  Abnormal   Collection Time: 08/09/20  9:11 PM   Specimen: Nasopharyngeal Swab; Nasopharyngeal(NP) swabs in vial transport medium  Result Value Ref Range Status   SARS Coronavirus 2 by RT PCR POSITIVE (A) NEGATIVE Final    Comment: RESULT CALLED TO, READ BACK BY AND VERIFIED WITH: Craig [name] '@[id number]'  ON 08/09/20 SKL (NOTE) SARS-CoV-2 target nucleic acids are DETECTED.  The SARS-CoV-2 RNA is generally detectable in upper respiratory specimens during the acute phase of infection. Positive results are indicative of the presence of the identified virus, but do not rule out bacterial infection or co-infection with other pathogens not detected by the test. Clinical correlation with patient history and other diagnostic information is necessary to determine patient infection status. The expected result is Negative.  Fact Sheet for Patients: EntrepreneurPulse.com.au  Fact Sheet for Healthcare Providers: IncredibleEmployment.be  This test is not yet approved or cleared by the [place] FDA and  has been authorized for detection and/or diagnosis of SARS-CoV-2 by FDA under an Emergency Use Authorization (EUA).  This EUA will remain in effect (meaning this test can be  used) for the duration of  the COVID-19 declaration under Section 564(b)(1) of the Act, 21 U.S.C. section 360bbb-3(b)(1), unless the authorization is terminated or revoked sooner.     Influenza A by PCR NEGATIVE NEGATIVE Final   Influenza B by PCR NEGATIVE NEGATIVE Final    Comment: (NOTE) The Xpert Xpress SARS-CoV-2/FLU/RSV plus assay is intended as an aid in the diagnosis of influenza from Nasopharyngeal swab specimens and should not be used as a sole basis for treatment. Nasal washings and aspirates are unacceptable for Xpert Xpress SARS-CoV-2/FLU/RSV testing.  Fact Sheet for Patients: EntrepreneurPulse.com.au  Fact Sheet for Healthcare  Providers: IncredibleEmployment.be  This test is not yet approved or cleared by the [place] FDA and has been authorized for detection and/or diagnosis of SARS-CoV-2 by FDA under an Emergency Use Authorization (EUA). This EUA will remain in effect (meaning this test can be used) for the duration of the COVID-19 declaration under Section 564(b)(1) of the Act, 21 U.S.C. section 360bbb-3(b)(1), unless the authorization is terminated or revoked.  Performed at [facility], [address], [place], [place] [address]   Urine culture     Status: None   Collection Time: 08/10/20  4:33 AM   Specimen: In/Out Cath Urine  Result Value Ref Range Status   Specimen Description   Final    IN/OUT CATH URINE Performed at [facility], [address] [place], [place] [address]    Special Requests   Final    NONE Performed at [facility], [address], [place], [place] [address]    Culture   Final    NO GROWTH Performed at [facility], [place] [address], [place], [place] [address]    Report Status 08/11/2020 FINAL  Final  MRSA PCR Screening     Status: None   Collection Time: 08/10/20 12:40 PM   Specimen: Nasopharyngeal  Result Value Ref Range Status   MRSA by PCR NEGATIVE NEGATIVE Final    Comment:        The GeneXpert MRSA Assay (FDA approved for NASAL specimens only), is one component of a comprehensive MRSA colonization surveillance program. It is not intended  to diagnose MRSA infection nor to guide or monitor treatment for MRSA infections. Performed at [facility], [address] [place], [place] [address]   CULTURE, BLOOD (ROUTINE X 2) w Reflex to ID Panel     Status: None   Collection Time: 08/15/20  1:14 PM   Specimen: BLOOD  Result Value Ref Range Status   Specimen Description BLOOD BLOOD LEFT HAND  Final   Special Requests   Final    BOTTLES DRAWN AEROBIC AND ANAEROBIC Blood Culture adequate volume    Culture   Final    NO GROWTH 5 DAYS Performed at [facility], [address] [place], [place] [address]    Report Status 08/20/2020 FINAL  Final  Culture, respiratory (non-expectorated)     Status: None   Collection Time: 08/15/20  2:00 PM   Specimen: Tracheal Aspirate; Respiratory  Result Value Ref Range Status   Specimen Description   Final    TRACHEAL ASPIRATE Performed at [facility], [address], [place], [place] [address]    Special Requests   Final    NONE Performed at [facility], [address] [place], [place] [address]    Gram Stain   Final    FEW WBC PRESENT, PREDOMINANTLY PMN FEW GRAM POSITIVE COCCI IN CLUSTERS IN CHAINS FEW YEAST    Culture   Final    Consistent with normal respiratory flora. Performed at [facility], [place] [address] [place], [place] [address]    Report Status 08/18/2020 FINAL  Final  CULTURE, BLOOD (ROUTINE X 2) w Reflex to ID Panel     Status: None   Collection Time: 08/15/20  2:34 PM   Specimen: BLOOD  Result Value Ref Range Status   Specimen Description BLOOD BLOOD LEFT HAND  Final   Special Requests   Final    BOTTLES DRAWN AEROBIC AND ANAEROBIC Blood Culture adequate volume   Culture   Final    NO GROWTH 5 DAYS Performed at [facility], [address], [place], [place] [address]    Report Status 08/20/2020 FINAL  Final    Anti-infectives:  Anti-infectives (From admission, onward)   Start     Dose/Rate Route Frequency Ordered Stop   08/16/20 1800  linezolid (ZYVOX) IVPB 600 mg        600 mg 300 mL/hr over 60 Minutes Intravenous Every 12 hours 08/16/20 1357 08/19/20 2357   08/15/20 1400  piperacillin-tazobactam (ZOSYN) IVPB 3.375 g        3.375 g 12.5 mL/hr over 240 Minutes Intravenous Every 8 hours 08/15/20 1105 08/20/20 0145   08/15/20 1200  vancomycin (VANCOREADY) IVPB 2000 mg/400 mL        2,000 mg 200 mL/hr over 120 Minutes Intravenous  Once 08/15/20 1105 08/15/20 1411    08/13/20 1400  remdesivir 100 mg in sodium chloride 0.9 % 100 mL IVPB        100 mg 200 mL/hr over 30 Minutes Intravenous  Once 08/13/20 1248 08/13/20 1731   08/11/20 0100  cefTRIAXone (ROCEPHIN) 2 g in sodium chloride 0.9 % 100 mL IVPB  Status:  Discontinued        2 g 200 mL/hr over 30 Minutes Intravenous Every 24 hours 08/10/20 0859 08/15/20 1105   08/10/20 1000  remdesivir 100 mg in sodium chloride 0.9 % 100 mL IVPB       "Followed by" Linked Group Details   100 mg 200 mL/hr over 30 Minutes Intravenous Daily  08/09/20 2236 08/14/20 0959   08/10/20 0000  cefTRIAXone (ROCEPHIN) 2 g in sodium chloride 0.9 % 100 mL IVPB        2 g 200 mL/hr over 30 Minutes Intravenous  Once 08/09/20 2352 08/10/20 0125   08/09/20 2345  levofloxacin (LEVAQUIN) IVPB 750 mg  Status:  Discontinued        750 mg 100 mL/hr over 90 Minutes Intravenous  Once 08/09/20 2339 08/09/20 2351   08/09/20 2300  remdesivir 200 mg in sodium chloride 0.9% 250 mL IVPB       "Followed by" Linked Group Details   200 mg 580 mL/hr over 30 Minutes Intravenous Once 08/09/20 2236 08/10/20 0201   08/09/20 2300  azithromycin (ZITHROMAX) 500 mg in sodium chloride 0.9 % 250 mL IVPB        500 mg 250 mL/hr over 60 Minutes Intravenous Every 24 hours 08/10/20 0859 08/12/20 2259   08/09/20 2230  azithromycin (ZITHROMAX) 500 mg in sodium chloride 0.9 % 250 mL IVPB        500 mg 250 mL/hr over 60 Minutes Intravenous  Once 08/09/20 2227 08/10/20 0038     MEDICATIONS   Scheduled Meds: . allopurinol  300 mg Per Tube Daily  . cetirizine  10 mg Oral Daily  . chlorhexidine gluconate (MEDLINE KIT)  15 mL Mouth Rinse BID  . Chlorhexidine Gluconate Cloth  6 each Topical Daily  . clonazePAM  2 mg Per Tube BID  . cloNIDine  0.2 mg Per Tube BID  . dexamethasone  6 mg Oral Daily  . enoxaparin (LOVENOX) injection  0.5 mg/kg Subcutaneous Q2200  . famotidine  20 mg Oral BID  . feeding supplement (PROSource TF)  90 mL Per Tube TID  . free water   200 mL Per Tube Q4H  . mouth rinse  15 mL Mouth Rinse 10 times per day  . melatonin  5 mg Per Tube QHS  . metoprolol tartrate  25 mg Oral BID  . phosphorus  250 mg Per Tube Q8H  . QUEtiapine  50 mg Per Tube BID  . sennosides  10 mL Per Tube BID  . sodium chloride flush  10-40 mL Intracatheter Q12H   Continuous Infusions: . sodium chloride Stopped (08/22/20 0039)  . feeding supplement (VITAL 1.5 CAL) 1,000 mL (08/22/20 1428)  . fentaNYL infusion INTRAVENOUS 350 mcg/hr (08/22/20 1552)  . insulin 12 Units/hr (08/22/20 1704)  . midazolam 8 mg/hr (08/22/20 1711)   PRN Meds:.sodium chloride, albuterol, dextrose, fentaNYL, midazolam, ondansetron **OR** ondansetron (ZOFRAN) IV, sodium chloride flush, vecuronium   ALLERGIES   Amoxicillin  REVIEW OF SYSTEMS   Unable to obtain due to sedation, intubation, and critical illness  PHYSICAL EXAMINATION   Vital Signs: Temp:  [94.64 F (34.8 C)-98.6 F (37 C)] 98.6 F (37 C) (01/03 1800) Pulse Rate:  [93] 93 (01/02 2120) Resp:  [24-31] 30 (01/03 1800) BP: (117-176)/(56-85) 121/62 (01/03 1800) SpO2:  [90 %-97 %] 97 % (01/03 1800) FiO2 (%):  [45 %-50 %] 45 % (01/03 1501) Weight:  [110 kg-112.1 kg] 112.1 kg (01/03 1200)   Vent Mode: PRVC FiO2 (%):  [45 %-50 %] 45 % Set Rate:  [30 bmp] 30 bmp Vt Set:  [550 mL] 550 mL PEEP:  [5 cmH20] [place] Pressure:  [21 cmH20-26 cmH20] 21 cmH20  Intake/Output: 12/31 0701 - 01/01 0700 In: 3441.2 [I.V.:1177.1; NG/GT:1514.9; IV Piggyback:749.1] Out: 2760 [Urine:1910; Stool:850]    GENERAL:Critically ill appearing male, laying in bed, intubated and sedated,  in NAD HEAD: Atraumatic, normocephalic,  EYES: Pupils PERRL, no scleral icterus MOUTH: Moist mucus membranes, ETT in place NECK: neck supple, no JVD no thyromegaly PULMONARY: Coarse breath sounds bilaterally, no wheezing or rhonchi, synchronouns with vent, even CARDIOVASCULAR: Regular rate and rhythm, s1s2, no M/R/G, 2+ distal  pulses GASTROINTESTINAL: Soft, nontender, nondistended, no guarding or rebound tenderness, BS+ x4 MUSCULOSKELETAL: No deformities, no edema NEUROLOGIC: Sedated (gets very agitated when sedation weaned) SKIN: Warm and dry.  No obvious rashes, lesions, or ulcerations   PERTINENT DATA   LAB RESULTS:  Basic Metabolic Panel: Recent Labs  Lab 08/18/20 0707 08/19/20 0609 08/20/20 0503 08/21/20 0033 08/22/20 0403  NA 146* 146* 141 140 143  K 3.9 4.0 4.0 4.8 4.2  CL 98 106 104 99 104  CO2 39* 32 31 32 33*  GLUCOSE 254* 199* 265* 362* 213*  BUN 33* 27* 20 18 24*  CREATININE 0.63 0.52* 0.47* 0.49* 0.39*  CALCIUM 8.9 8.8* 8.8* 8.8* 8.9  MG 2.0 2.0 1.9 2.0 2.0  PHOS 3.5 2.7 3.3 1.8* 2.2*   Liver Function Tests: No results for input(s): AST, ALT, ALKPHOS, BILITOT, PROT, ALBUMIN in the last 168 hours. No results for input(s): LIPASE, AMYLASE in the last 168 hours. No results for input(s): AMMONIA in the last 168 hours. CBC: Recent Labs  Lab 08/18/20 0851 08/19/20 0609 08/20/20 0503 08/21/20 0033 08/22/20 0403  WBC 8.3 7.3 8.5 11.7* 10.8*  NEUTROABS 7.5 6.1 7.4 10.9* 9.4*  HGB 9.7* 9.5* 10.1* 9.4* 8.8*  HCT 32.1* 32.1* 33.6* 31.1* 29.3*  MCV 95.5 96.7 96.8 95.7 94.8  PLT 205 185 165 176 217   Cardiac Enzymes: No results for input(s): CKTOTAL, CKMB, CKMBINDEX, TROPONINI in the last 168 hours. BNP: Invalid input(s): POCBNP CBG: Recent Labs  Lab 08/22/20 1400 08/22/20 1446 08/22/20 1554 08/22/20 1702 08/22/20 1758  GLUCAP 164* 129* 142* 156* 171*    IMAGING RESULTS:   Imaging: DG Chest 1 View  Result Date: 08/21/2020 CLINICAL DATA:  Respiratory failure secondary to COVID-19 pneumonia. EXAM: CHEST  1 VIEW COMPARISON:  08/20/2020 FINDINGS: Endotracheal tube tip approximately 3.5 cm above the carina. Gastric decompression tube positioning stable. The heart size and mediastinal contours are within normal limits. Overall pulmonary aeration slightly improved in both upper  lung zones. Severe residual bilateral airspace disease remains present. No pneumothorax or significant pleural fluid. The visualized skeletal structures are unremarkable. IMPRESSION: Slightly improved aeration in both upper lung zones. Electronically Signed   By: Aletta Edouard M.D.   On: 08/21/2020 11:34   DG Chest Port 1 View  Result Date: 08/22/2020 CLINICAL DATA:  Acute respiratory failure, COVID ARDS EXAM: PORTABLE CHEST 1 VIEW COMPARISON:  08/21/2020 FINDINGS: Endotracheal tube remains 4.3 cm above the carina. NG tube enters the stomach with the tip not visualized. Esophageal temp probe noted at the T4 level. Slight improvement in the severe bilateral airspace process compared to yesterday. No developing effusion or pneumothorax. Stable cardiomegaly. No acute osseous finding. IMPRESSION: Slight improvement in diffuse bilateral airspace process compatible with COVID ARDS. Stable support apparatus. Electronically Signed   By: Jerilynn Mages.  Shick M.D.   On: 08/22/2020 14:06   '@PROBHOSP' @ DG Chest Port 1 View  Result Date: 08/22/2020 CLINICAL DATA:  Acute respiratory failure, COVID ARDS EXAM: PORTABLE CHEST 1 VIEW COMPARISON:  08/21/2020 FINDINGS: Endotracheal tube remains 4.3 cm above the carina. NG tube enters the stomach with the tip not visualized. Esophageal temp probe noted at the T4 level. Slight improvement in the severe bilateral  airspace process compared to yesterday. No developing effusion or pneumothorax. Stable cardiomegaly. No acute osseous finding. IMPRESSION: Slight improvement in diffuse bilateral airspace process compatible with COVID ARDS. Stable support apparatus. Electronically Signed   By: Jerilynn Mages.  Shick M.D.   On: 08/22/2020 14:06     ASSESSMENT AND PLAN   Acute Hypoxic Respiratory Failure in the setting of COVID-19 Pneumonia with severe ARDS -Mechanical ventilation, prone positioning -Wean PEEP and FiO2 as able to maintain O2 sats >88% -Goal plateau pressure less than 30, driving pressure  less than 15 -Paralytics if necessary for vent synchrony, gas exchange -Cycle prone positioning if necessary for oxygenation -Deep sedation per PAD protocol, goal RASS -4, currently fentanyl, midazolam -Diuresis as blood pressure and renal function can tolerate, goal CVP 5-8. -Check BNP -VAP prevention order set -Steroids -Follow inflammatory markers: Ferritin, D-dimer, CRP, IL-6, LDH -Vitamin C, zinc -Started dexamethasone suspect "rebound phenomenon " -Continue recruitment maneuvers    COVID-19 Pneumonia Query VAP, cultures negative so far -Monitor fever curve -Trend WBC's -Follow cultures -Discontinued Zosyn and Zyvox    Metabolic Alkalosis, likely in setting of aggressive diuresis with Lasix>>improved -Monitor I&O's / urinary output -Follow BMP -Ensure adequate renal perfusion -Avoid nephrotoxic agents as able -Replace electrolytes as indicated   Acute Metabolic Encephalopathy -Maintain a RASS of -3 to -4 -Fentanyl and versed gtts to maintain RASS goal -Continue Seroquel to assist with weaning sedation and agitation  -Daily wake up assessments -Provide supportive care   Anemia without s/sx of bleeding -Monitor for S/Sx of bleeding -Trend CBC -Lovenox for VTE Prophylaxis  -Transfuse for Hgb <7  Tachycardia Beta-blockers Continue monitoring   Perforated diverticulitis   - s/p colostomy     - Reviewed with General Surgery and says okay to prone with colostomy    GI/Nutrition -Tube feeds -Constipation protocol as indicated   Hyperglycemia -CBG's -SSI -Follow ICU Hypo/Hyperglycemia protocol    BEST PRACTICES DISPOSITION: ICU GOALS OF CARE: Full code SUP: Pepcid  VTE PROPHYLAXIS: Lovenox UPDATES: Updated pt's significant other Craig [name] via telephone 08/20/2020  Critical care provider statement:    Critical care time (minutes):  33   Critical care time was exclusive of:  Separately billable procedures and  treating other patients    Critical care was necessary to treat or prevent imminent or  life-threatening deterioration of the following conditions:  acute hypoxemic respiratory failure due to [place] care was time spent personally by me on the following  activities:  Development of treatment plan with patient or surrogate,  discussions with consultants, evaluation of patient's response to  treatment, examination of patient, obtaining history from patient or  surrogate, ordering and performing treatments and interventions, ordering  and review of laboratory studies and re-evaluation of patient's condition   I assumed direction of critical care for this patient from another  provider in my specialty: no                                                                    Ottie Glazier, M.D.  Pulmonary [facility] [place]

## 2020-08-23 LAB — CBC WITH DIFFERENTIAL/PLATELET
Abs Immature Granulocytes: 0.57 10*3/uL — ABNORMAL HIGH (ref 0.00–0.07)
Basophils Absolute: 0 10*3/uL (ref 0.0–0.1)
Basophils Relative: 0 %
Eosinophils Absolute: 0 10*3/uL (ref 0.0–0.5)
Eosinophils Relative: 0 %
HCT: 29.7 % — ABNORMAL LOW (ref 39.0–52.0)
Hemoglobin: 9.1 g/dL — ABNORMAL LOW (ref 13.0–17.0)
Immature Granulocytes: 7 %
Lymphocytes Relative: 9 %
Lymphs Abs: 0.7 10*3/uL (ref 0.7–4.0)
MCH: 29 pg (ref 26.0–34.0)
MCHC: 30.6 g/dL (ref 30.0–36.0)
MCV: 94.6 fL (ref 80.0–100.0)
Monocytes Absolute: 0.5 10*3/uL (ref 0.1–1.0)
Monocytes Relative: 6 %
Neutro Abs: 6.2 10*3/uL (ref 1.7–7.7)
Neutrophils Relative %: 78 %
Platelets: 225 10*3/uL (ref 150–400)
RBC: 3.14 MIL/uL — ABNORMAL LOW (ref 4.22–5.81)
RDW: 15 % (ref 11.5–15.5)
Smear Review: NORMAL
WBC: 8 10*3/uL (ref 4.0–10.5)
nRBC: 0.9 % — ABNORMAL HIGH (ref 0.0–0.2)

## 2020-08-23 LAB — GLUCOSE, CAPILLARY
Glucose-Capillary: 114 mg/dL — ABNORMAL HIGH (ref 70–99)
Glucose-Capillary: 137 mg/dL — ABNORMAL HIGH (ref 70–99)
Glucose-Capillary: 142 mg/dL — ABNORMAL HIGH (ref 70–99)
Glucose-Capillary: 154 mg/dL — ABNORMAL HIGH (ref 70–99)
Glucose-Capillary: 161 mg/dL — ABNORMAL HIGH (ref 70–99)
Glucose-Capillary: 162 mg/dL — ABNORMAL HIGH (ref 70–99)
Glucose-Capillary: 163 mg/dL — ABNORMAL HIGH (ref 70–99)
Glucose-Capillary: 165 mg/dL — ABNORMAL HIGH (ref 70–99)
Glucose-Capillary: 165 mg/dL — ABNORMAL HIGH (ref 70–99)
Glucose-Capillary: 167 mg/dL — ABNORMAL HIGH (ref 70–99)
Glucose-Capillary: 167 mg/dL — ABNORMAL HIGH (ref 70–99)
Glucose-Capillary: 171 mg/dL — ABNORMAL HIGH (ref 70–99)
Glucose-Capillary: 175 mg/dL — ABNORMAL HIGH (ref 70–99)
Glucose-Capillary: 181 mg/dL — ABNORMAL HIGH (ref 70–99)
Glucose-Capillary: 187 mg/dL — ABNORMAL HIGH (ref 70–99)
Glucose-Capillary: 188 mg/dL — ABNORMAL HIGH (ref 70–99)
Glucose-Capillary: 189 mg/dL — ABNORMAL HIGH (ref 70–99)
Glucose-Capillary: 195 mg/dL — ABNORMAL HIGH (ref 70–99)
Glucose-Capillary: 197 mg/dL — ABNORMAL HIGH (ref 70–99)
Glucose-Capillary: 205 mg/dL — ABNORMAL HIGH (ref 70–99)

## 2020-08-23 LAB — BASIC METABOLIC PANEL
Anion gap: 8 (ref 5–15)
BUN: 27 mg/dL — ABNORMAL HIGH (ref 6–20)
CO2: 35 mmol/L — ABNORMAL HIGH (ref 22–32)
Calcium: 8.8 mg/dL — ABNORMAL LOW (ref 8.9–10.3)
Chloride: 102 mmol/L (ref 98–111)
Creatinine, Ser: 0.43 mg/dL — ABNORMAL LOW (ref 0.61–1.24)
GFR, Estimated: >60 mL/min (ref >60)
Glucose, Bld: 194 mg/dL — ABNORMAL HIGH (ref 70–99)
Potassium: 4.1 mmol/L (ref 3.5–5.1)
Sodium: 145 mmol/L (ref 135–145)

## 2020-08-23 LAB — PHOSPHORUS: Phosphorus: 4.1 mg/dL (ref 2.5–4.6)

## 2020-08-23 LAB — MAGNESIUM: Magnesium: 2.1 mg/dL (ref 1.7–2.4)

## 2020-08-23 MED ORDER — FUROSEMIDE 10 MG/ML IJ SOLN
80.0000 mg | Freq: Once | INTRAMUSCULAR | Status: AC
  Administered 2020-08-23: 80 mg via INTRAVENOUS
  Filled 2020-08-23: qty 8

## 2020-08-23 NOTE — Progress Notes (Signed)
Pt unable to tolerate weaning fio2 today. When fio2 decreased pts spo2 decreases to the low 80's, becomes tachycardic, and hypertensive with RR in the 40's. Sedation medications are at max. Pt coughs often and has a lot of tan secretions. Pt's wife given update of events today, will continue to monitor.

## 2020-08-23 NOTE — Progress Notes (Signed)
CRITICAL CARE PROGRESS NOTE    Name: [place] [name] MRN: [id number] DOB: 06-25-1991  Referring physician: Dr Reesa Chew    LOS: [place]    Patient description:  30 yo male recent hospitalization for perforated diverticulitis s/p colostomy with reversal and leakage of anastomosis with s/p revision now acutely hypoxemic with COVID19 induced severe ARDS.   INTERVAL EVENTS: 08/11/20-patient continued to decline with worsening respiratory status despite 100%Fio2 on BIPAP and HFNC failure.  He stated that he feels he is dying and cannot breathe.  I discussed with wife Craig earlier today that patient is critically ill may need ETT, she is agreeable and thankful for care. I specifically discussed and explained intubation and mechanical ventilation to patient and he wishes to proceed.   08/12/20- patient was weaned on FiO2 to 60%. Spoke to wife Craig.  08/13/20- Patient weaned to 40%.  During SBT today he self extubated and was placed on HFNC.  He subsequently became hypoxemic, disoriented aggitated, removed PIVs and colostomy and proceeded to have combative behavior with worsening oxygenation. I have attempted to calm patient and encouraged him to cooperate.  After numerous attempts patient continued to be combative with severe aggitation with combative behavior and would not wear oxygen. Patient was placed on sedation and MV.  Wife updated.   08/14/20- patient remains critically ill.  Weaned from 100%>>55%. Called and updated wife [place] [name] today 12/27-Patient with worsening oxygenation on FiO2 100% peep of 14 08/16/20-Patient with continued high oxygen requirements overnight. Have spoken with surgery and there should be no problem proning patient with this. Was febrile yesterday and  thus broad spectrum abx started and cultures attained.  08-17-20-patient down to PEEP 10 FiO2 55%.  Patient never required proning 08/18/20: FiO2 weaned to 40%, PEEP 10, plan for diuresis with Diamox today, adding PO Clonazepam to assist weaning sedation 08/19/20: Vent changed to Pressure Control this morning: 50% FiO2, 24, 22/5, Plan for Recruitment maneuvers and diuresis 08/20/2020: Continued issues with hypercapnia due to the dead space ventilation.  Worsening despite pressure control.  Switch back to [name], prone positioning 08/23/19- patient proned today without incident, continue to wean FiO2 , currently on 50% 08/24/19- patient weaned to 40% FiO2, plan to continue proning with full scope of care.  I have spoken to Craig [name] (wife of patient today) she would like to be present prior to next extubation attempt to help console patient and decrease his aggitation.  Lines/tubes : Closed System Drain 1 Right Abdomen Bulb (JP) 19 Fr. (Active)     Colostomy LLQ (Active)  Stoma Assessment Pink;Red 08/10/20 0944  Peristomal Assessment Intact 08/10/20 0944    Microbiology/Sepsis markers: Results for orders placed or performed during the hospital encounter of 08/09/20  Culture, blood (Routine X 2) w Reflex to ID Panel     Status: None   Collection Time: 08/09/20  9:06 PM   Specimen: BLOOD  Result Value Ref Range Status   Specimen Description BLOOD RIGHT ARM  Final   Special Requests   Final    BOTTLES DRAWN AEROBIC AND ANAEROBIC Blood Culture adequate volume   Culture   Final    NO GROWTH 5 DAYS Performed at [name], [address], [name], [name] [address]    Report Status 08/15/2020 FINAL  Final  Culture, blood (Routine X 2) w Reflex to ID Panel     Status: None   Collection Time: 08/09/20  9:06 PM   Specimen: BLOOD  Result Value Ref Range Status   Specimen Description BLOOD RIGHT [name]  Final   Special Requests   Final    BOTTLES DRAWN AEROBIC AND ANAEROBIC Blood Culture  adequate volume   Culture   Final    NO GROWTH 5 DAYS Performed at [name], [address] [place], [name] [address]    Report Status 08/15/2020 FINAL  Final  Resp Panel by RT-PCR (Flu A&B, Covid) Nasopharyngeal Swab     Status: Abnormal   Collection Time: 08/09/20  9:11 PM   Specimen: Nasopharyngeal Swab; Nasopharyngeal(NP) swabs in vial transport medium  Result Value Ref Range Status   SARS Coronavirus 2 by RT PCR POSITIVE (A) NEGATIVE Final    Comment: RESULT CALLED TO, READ BACK BY AND VERIFIED WITH: [name] _0  ON 08/09/20 SKL (NOTE) SARS-CoV-2 target nucleic acids are DETECTED.  The SARS-CoV-2 RNA is generally detectable in upper respiratory specimens during the acute phase of infection. Positive results are indicative of the presence of the identified virus, but do not rule out bacterial infection or co-infection with other pathogens not detected by the test. Clinical correlation with patient history and other diagnostic information is necessary to determine patient infection status. The expected result is Negative.  Fact Sheet for Patients: EntrepreneurPulse.com.au  Fact Sheet for Healthcare Providers: IncredibleEmployment.be  This test is not yet approved or cleared by the [place] FDA and  has been authorized for detection and/or diagnosis of SARS-CoV-2 by FDA under an Emergency Use Authorization (EUA).  This EUA will remain in effect (meaning this test can be  used) for the duration of  the COVID-19 declaration under Section 564(b)(1) of the Act, 21 U.S.C. section 360bbb-3(b)(1), unless the authorization is terminated or revoked sooner.     Influenza A by PCR NEGATIVE NEGATIVE Final   Influenza B by PCR NEGATIVE NEGATIVE Final    Comment: (NOTE) The Xpert Xpress SARS-CoV-2/FLU/RSV plus assay is intended as an aid in the diagnosis of influenza from Nasopharyngeal swab specimens and should not be used  as a sole basis for treatment. Nasal washings and aspirates are unacceptable for Xpert Xpress SARS-CoV-2/FLU/RSV testing.  Fact Sheet for Patients: EntrepreneurPulse.com.au  Fact Sheet for Healthcare Providers: IncredibleEmployment.be  This test is not yet approved or cleared by the [place] FDA and has been authorized for detection and/or diagnosis of SARS-CoV-2 by FDA under an Emergency Use Authorization (EUA). This EUA will remain in effect (meaning this test can be used) for the duration of the COVID-19 declaration under Section 564(b)(1) of the Act, 21 U.S.C. section 360bbb-3(b)(1), unless the authorization is terminated or revoked.  Performed at [facility], [address], [place], [place] [address]   Urine culture     Status: None   Collection Time: 08/10/20  4:33 AM   Specimen: In/Out Cath Urine  Result Value Ref Range Status   Specimen Description   Final    IN/OUT CATH URINE Performed at [name], [address] [name] [address] [place], [place] [address]    Special Requests   Final    NONE Performed at [facility], [address], [place], [place] [address]    Culture   Final    NO GROWTH Performed at [facility], [place] [address] [place], [place] [address]    Report Status 08/11/2020 FINAL  Final  MRSA PCR Screening     Status: None   Collection Time: 08/10/20 12:40 PM   Specimen: Nasopharyngeal  Result Value Ref Range  Status   MRSA by PCR NEGATIVE NEGATIVE Final    Comment:        The GeneXpert MRSA Assay (FDA approved for NASAL specimens only), is one component of a comprehensive MRSA colonization surveillance program. It is not intended to diagnose MRSA infection nor to guide or monitor treatment for MRSA infections. Performed at [facility], [address] [place], [place] [address]   CULTURE, BLOOD (ROUTINE X 2) w Reflex to ID Panel     Status: None   Collection  Time: 08/15/20  1:14 PM   Specimen: BLOOD  Result Value Ref Range Status   Specimen Description BLOOD BLOOD LEFT HAND  Final   Special Requests   Final    BOTTLES DRAWN AEROBIC AND ANAEROBIC Blood Culture adequate volume   Culture   Final    NO GROWTH 5 DAYS Performed at [name], [address], [place], [place] [address]    Report Status 08/20/2020 FINAL  Final  Culture, respiratory (non-expectorated)     Status: None   Collection Time: 08/15/20  2:00 PM   Specimen: Tracheal Aspirate; Respiratory  Result Value Ref Range Status   Specimen Description   Final    TRACHEAL ASPIRATE Performed at [name], [address], [place], [place] [address]    Special Requests   Final    NONE Performed at [facility], [address] [place], [place] [address]    Gram Stain   Final    FEW WBC PRESENT, PREDOMINANTLY PMN FEW GRAM POSITIVE COCCI IN CLUSTERS IN CHAINS FEW YEAST    Culture   Final    Consistent with normal respiratory flora. Performed at [facility], [place] [address], [place], [place] [address]    Report Status 08/18/2020 FINAL  Final  CULTURE, BLOOD (ROUTINE X 2) w Reflex to ID Panel     Status: None   Collection Time: 08/15/20  2:34 PM   Specimen: BLOOD  Result Value Ref Range Status   Specimen Description BLOOD BLOOD LEFT HAND  Final   Special Requests   Final    BOTTLES DRAWN AEROBIC AND ANAEROBIC Blood Culture adequate volume   Culture   Final    NO GROWTH 5 DAYS Performed at [name], [address], [place], [place] [address]    Report Status 08/20/2020 FINAL  Final    Anti-infectives:  Anti-infectives (From admission, onward)   Start     Dose/Rate Route Frequency Ordered Stop   08/16/20 1800  linezolid (ZYVOX) IVPB 600 mg        600 mg 300 mL/hr over 60 Minutes Intravenous Every 12 hours 08/16/20 1357 08/19/20 2357   08/15/20 1400  piperacillin-tazobactam (ZOSYN) IVPB 3.375 g        3.375 g 12.5  mL/hr over 240 Minutes Intravenous Every 8 hours 08/15/20 1105 08/20/20 0145   08/15/20 1200  vancomycin (VANCOREADY) IVPB 2000 mg/400 mL        2,000 mg 200 mL/hr over 120 Minutes Intravenous  Once 08/15/20 1105 08/15/20 1411   08/13/20 1400  remdesivir 100 mg in sodium chloride 0.9 % 100 mL IVPB        100 mg 200 mL/hr over 30 Minutes Intravenous  Once 08/13/20 1248 08/13/20 1731   08/11/20 0100  cefTRIAXone (ROCEPHIN) 2 g in sodium chloride 0.9 % 100 mL IVPB  Status:  Discontinued        2 g 200 mL/hr over 30 Minutes Intravenous Every  24 hours 08/10/20 0859 08/15/20 1105   08/10/20 1000  remdesivir 100 mg in sodium chloride 0.9 % 100 mL IVPB       "Followed by" Linked Group Details   100 mg 200 mL/hr over 30 Minutes Intravenous Daily 08/09/20 2236 08/14/20 0959   08/10/20 0000  cefTRIAXone (ROCEPHIN) 2 g in sodium chloride 0.9 % 100 mL IVPB        2 g 200 mL/hr over 30 Minutes Intravenous  Once 08/09/20 2352 08/10/20 0125   08/09/20 2345  levofloxacin (LEVAQUIN) IVPB 750 mg  Status:  Discontinued        750 mg 100 mL/hr over 90 Minutes Intravenous  Once 08/09/20 2339 08/09/20 2351   08/09/20 2300  remdesivir 200 mg in sodium chloride 0.9% 250 mL IVPB       "Followed by" Linked Group Details   200 mg 580 mL/hr over 30 Minutes Intravenous Once 08/09/20 2236 08/10/20 0201   08/09/20 2300  azithromycin (ZITHROMAX) 500 mg in sodium chloride 0.9 % 250 mL IVPB        500 mg 250 mL/hr over 60 Minutes Intravenous Every 24 hours 08/10/20 0859 08/12/20 2259   08/09/20 2230  azithromycin (ZITHROMAX) 500 mg in sodium chloride 0.9 % 250 mL IVPB        500 mg 250 mL/hr over 60 Minutes Intravenous  Once 08/09/20 2227 08/10/20 0038     MEDICATIONS   Scheduled Meds: . allopurinol  300 mg Per Tube Daily  . cetirizine  10 mg Oral Daily  . chlorhexidine gluconate (MEDLINE KIT)  15 mL Mouth Rinse BID  . Chlorhexidine Gluconate Cloth  6 each Topical Daily  . clonazePAM  2 mg Per Tube BID  .  cloNIDine  0.2 mg Per Tube BID  . dexamethasone  6 mg Oral Daily  . enoxaparin (LOVENOX) injection  0.5 mg/kg Subcutaneous Q2200  . famotidine  20 mg Oral BID  . feeding supplement (PROSource TF)  90 mL Per Tube TID  . free water  200 mL Per Tube Q4H  . mouth rinse  15 mL Mouth Rinse 10 times per day  . melatonin  5 mg Per Tube QHS  . metoprolol tartrate  25 mg Oral BID  . QUEtiapine  50 mg Per Tube BID  . sennosides  10 mL Per Tube BID  . sodium chloride flush  10-40 mL Intracatheter Q12H   Continuous Infusions: . sodium chloride Stopped (08/22/20 0039)  . feeding supplement (VITAL 1.5 CAL) 1,000 mL (08/23/20 0901)  . fentaNYL infusion INTRAVENOUS 400 mcg/hr (08/23/20 1045)  . insulin 12 Units/hr (08/23/20 1517)  . midazolam 9 mg/hr (08/23/20 1214)   PRN Meds:.sodium chloride, albuterol, dextrose, fentaNYL, midazolam, ondansetron **OR** ondansetron (ZOFRAN) IV, sodium chloride flush, vecuronium   ALLERGIES   Amoxicillin  REVIEW OF SYSTEMS   Unable to obtain due to sedation, intubation, and critical illness  PHYSICAL EXAMINATION   Vital Signs: Temp:  [93.92 F (34.4 C)-98.6 F (37 C)] 98.42 F (36.9 C) (01/04 1500) Pulse Rate:  [86] 86 (01/03 2223) Resp:  [18-30] 30 (01/04 1500) BP: (110-173)/(55-111) 125/67 (01/04 1500) SpO2:  [85 %-100 %] 94 % (01/04 1500) FiO2 (%):  [35 %-45 %] 40 % (01/04 1442) Weight:  [112.1 kg] 112.1 kg (01/04 0500)   Vent Mode: PRVC FiO2 (%):  [35 %-45 %] 40 % Set Rate:  [30 bmp] 30 bmp Vt Set:  [550 mL] 550 mL PEEP:  [5 cmH20] 5 cmH20 Plateau Pressure:  [22  cmH20] 22 cmH20  Intake/Output: 12/31 0701 - 01/01 0700 In: 3441.2 [I.V.:1177.1; NG/GT:1514.9; IV Piggyback:749.1] Out: 2760 [Urine:1910; Stool:850]    GENERAL:Critically ill appearing male, laying in bed, intubated and sedated, in NAD HEAD: Atraumatic, normocephalic,  EYES: Pupils PERRL, no scleral icterus MOUTH: Moist mucus membranes, ETT in place NECK: neck supple, no JVD  no thyromegaly PULMONARY: Coarse breath sounds bilaterally, no wheezing or rhonchi, synchronouns with vent, even CARDIOVASCULAR: Regular rate and rhythm, s1s2, no M/R/G, 2+ distal pulses GASTROINTESTINAL: Soft, nontender, nondistended, no guarding or rebound tenderness, BS+ x4 MUSCULOSKELETAL: No deformities, no edema NEUROLOGIC: Sedated (gets very agitated when sedation weaned) SKIN: Warm and dry.  No obvious rashes, lesions, or ulcerations   PERTINENT DATA   LAB RESULTS:  Basic Metabolic Panel: Recent Labs  Lab 08/19/20 0609 08/20/20 0503 08/21/20 0033 08/22/20 0403 08/23/20 0442  NA 146* 141 140 143 145  K 4.0 4.0 4.8 4.2 4.1  CL 106 104 99 104 102  CO2 32 31 32 33* 35*  GLUCOSE 199* 265* 362* 213* 194*  BUN 27* 20 18 24* 27*  CREATININE 0.52* 0.47* 0.49* 0.39* 0.43*  CALCIUM 8.8* 8.8* 8.8* 8.9 8.8*  MG 2.0 1.9 2.0 2.0 2.1  PHOS 2.7 3.3 1.8* 2.2* 4.1   Liver Function Tests: No results for input(s): AST, ALT, ALKPHOS, BILITOT, PROT, ALBUMIN in the last 168 hours. No results for input(s): LIPASE, AMYLASE in the last 168 hours. No results for input(s): AMMONIA in the last 168 hours. CBC: Recent Labs  Lab 08/19/20 0609 08/20/20 0503 08/21/20 0033 08/22/20 0403 08/23/20 0442  WBC 7.3 8.5 11.7* 10.8* 8.0  NEUTROABS 6.1 7.4 10.9* 9.4* 6.2  HGB 9.5* 10.1* 9.4* 8.8* 9.1*  HCT 32.1* 33.6* 31.1* 29.3* 29.7*  MCV 96.7 96.8 95.7 94.8 94.6  PLT 185 165 176 217 225   Cardiac Enzymes: No results for input(s): CKTOTAL, CKMB, CKMBINDEX, TROPONINI in the last 168 hours. BNP: Invalid input(s): POCBNP CBG: Recent Labs  Lab 08/23/20 1048 08/23/20 1209 08/23/20 1310 08/23/20 1403 08/23/20 1516  GLUCAP 161* 137* 188* 205* 175*    IMAGING RESULTS:   Imaging: DG Chest Port 1 View  Result Date: 08/22/2020 CLINICAL DATA:  Acute respiratory failure, COVID ARDS EXAM: PORTABLE CHEST 1 VIEW COMPARISON:  08/21/2020 FINDINGS: Endotracheal tube remains 4.3 cm above the carina.  NG tube enters the stomach with the tip not visualized. Esophageal temp probe noted at the T4 level. Slight improvement in the severe bilateral airspace process compared to yesterday. No developing effusion or pneumothorax. Stable cardiomegaly. No acute osseous finding. IMPRESSION: Slight improvement in diffuse bilateral airspace process compatible with COVID ARDS. Stable support apparatus. Electronically Signed   By: Jerilynn Mages.  Shick M.D.   On: 08/22/2020 14:06   _0 @ No results found.   ASSESSMENT AND PLAN   Acute Hypoxic Respiratory Failure in the setting of COVID-19 Pneumonia with severe ARDS -Mechanical ventilation, prone positioning -Wean PEEP and FiO2 as able to maintain O2 sats >88% -Goal plateau pressure less than 30, driving pressure less than 15 -Paralytics if necessary for vent synchrony, gas exchange -Cycle prone positioning if necessary for oxygenation -Deep sedation per PAD protocol, goal RASS -4, currently fentanyl, midazolam -Diuresis as blood pressure and renal function can tolerate, goal CVP 5-8. -Check BNP -VAP prevention order set -Steroids -Follow inflammatory markers: Ferritin, D-dimer, CRP, IL-6, LDH -Vitamin C, zinc -Started dexamethasone suspect "rebound phenomenon " -Continue recruitment maneuvers    COVID-19 Pneumonia Query VAP, cultures negative so far -Monitor fever curve -Trend  WBC's -Follow cultures -Discontinued Zosyn and Zyvox    Metabolic Alkalosis, likely in setting of aggressive diuresis with Lasix>>improved -Monitor I&O's / urinary output -Follow BMP -Ensure adequate renal perfusion -Avoid nephrotoxic agents as able -Replace electrolytes as indicated   Acute Metabolic Encephalopathy -Maintain a RASS of -3 to -4 -Fentanyl and versed gtts to maintain RASS goal -Continue Seroquel to assist with weaning sedation and agitation  -Daily wake up assessments -Provide supportive care   Anemia without s/sx of bleeding -Monitor for S/Sx of  bleeding -Trend CBC -Lovenox for VTE Prophylaxis  -Transfuse for Hgb <7  Tachycardia Beta-blockers Continue monitoring   Perforated diverticulitis   - s/p colostomy     - Reviewed with General Surgery and says okay to prone with colostomy    GI/Nutrition -Tube feeds -Constipation protocol as indicated   Hyperglycemia -CBG's -SSI -Follow ICU Hypo/Hyperglycemia protocol    BEST PRACTICES DISPOSITION: ICU GOALS OF CARE: Full code SUP: Pepcid  VTE PROPHYLAXIS: Lovenox UPDATES: Updated pt's significant other [place] [name] via telephone 08/20/2020  Critical care provider statement:    Critical care time (minutes):  33   Critical care time was exclusive of:  Separately billable procedures and  treating other patients   Critical care was necessary to treat or prevent imminent or  life-threatening deterioration of the following conditions:  acute hypoxemic respiratory failure due to [place] care was time spent personally by me on the following  activities:  Development of treatment plan with patient or surrogate,  discussions with consultants, evaluation of patient's response to  treatment, examination of patient, obtaining history from patient or  surrogate, ordering and performing treatments and interventions, ordering  and review of laboratory studies and re-evaluation of patient's condition   I assumed direction of critical care for this patient from another  provider in my specialty: no                                                                    Ottie Glazier, M.D.  [facility] [place]

## 2020-08-23 NOTE — Progress Notes (Signed)
Inpatient Diabetes Program Recommendations  AACE/ADA: New Consensus Statement on Inpatient Glycemic Control (2015)  Target Ranges:  Prepandial:   less than 140 mg/dL      Peak postprandial:   less than 180 mg/dL (1-2 hours)      Critically ill patients:  140 - 180 mg/dL   Lab Results  Component Value Date   GLUCAP 161 (H) 08/23/2020   HGBA1C 5.7 (H) 08/10/2020    Review of Glycemic Control Results for PAL, SHELL (MRN [id number]) as of 08/23/2020 12:05  Ref. Range 08/23/2020 05:28 08/23/2020 06:29 08/23/2020 07:35 08/23/2020 08:40 08/23/2020 09:50 08/23/2020 10:48  Glucose-Capillary Latest Ref Range: 70 - 99 mg/dL 818 (H)  14 units/hr  299 (H)  18 units/hr 167 (H)  15 units/hr 114 (H)  3.2 units/hr 162 (H)  11.5 units/hr 161 (H)  11.5 units/hr  Diabetes history:No Current orders for Inpatient glycemic control:  IV insulin/EndoTool Decadron 6 mg daily Inpatient Diabetes Program Recommendations:   Insulin drip rate continue to be high>10 units/hr.  At this point, it is likely safest to continue insulin drip.   Thanks,  Beryl Meager, RN, BC-ADM Inpatient Diabetes Coordinator Pager [phone] (8a-5p)

## 2020-08-24 DIAGNOSIS — U071 COVID-19: Secondary | ICD-10-CM | POA: Diagnosis not present

## 2020-08-24 DIAGNOSIS — A419 Sepsis, unspecified organism: Secondary | ICD-10-CM | POA: Diagnosis not present

## 2020-08-24 DIAGNOSIS — J9601 Acute respiratory failure with hypoxia: Secondary | ICD-10-CM | POA: Diagnosis not present

## 2020-08-24 DIAGNOSIS — R6520 Severe sepsis without septic shock: Secondary | ICD-10-CM | POA: Diagnosis not present

## 2020-08-24 LAB — CBC WITH DIFFERENTIAL/PLATELET
Abs Immature Granulocytes: 0.7 10*3/uL — ABNORMAL HIGH (ref 0.00–0.07)
Basophils Absolute: 0 10*3/uL (ref 0.0–0.1)
Basophils Relative: 0 %
Eosinophils Absolute: 0 10*3/uL (ref 0.0–0.5)
Eosinophils Relative: 0 %
HCT: 31.9 % — ABNORMAL LOW (ref 39.0–52.0)
Hemoglobin: 9.8 g/dL — ABNORMAL LOW (ref 13.0–17.0)
Immature Granulocytes: 8 %
Lymphocytes Relative: 11 %
Lymphs Abs: 1 10*3/uL (ref 0.7–4.0)
MCH: 28.5 pg (ref 26.0–34.0)
MCHC: 30.7 g/dL (ref 30.0–36.0)
MCV: 92.7 fL (ref 80.0–100.0)
Monocytes Absolute: 0.7 10*3/uL (ref 0.1–1.0)
Monocytes Relative: 8 %
Neutro Abs: 6.7 10*3/uL (ref 1.7–7.7)
Neutrophils Relative %: 73 %
Platelets: 219 10*3/uL (ref 150–400)
RBC: 3.44 MIL/uL — ABNORMAL LOW (ref 4.22–5.81)
RDW: 15.4 % (ref 11.5–15.5)
Smear Review: NORMAL
WBC: 9.1 10*3/uL (ref 4.0–10.5)
nRBC: 1.3 % — ABNORMAL HIGH (ref 0.0–0.2)

## 2020-08-24 LAB — GLUCOSE, CAPILLARY
Glucose-Capillary: 193 mg/dL — ABNORMAL HIGH (ref 70–99)
Glucose-Capillary: 167 mg/dL — ABNORMAL HIGH (ref 70–99)
Glucose-Capillary: 173 mg/dL — ABNORMAL HIGH (ref 70–99)
Glucose-Capillary: 174 mg/dL — ABNORMAL HIGH (ref 70–99)
Glucose-Capillary: 148 mg/dL — ABNORMAL HIGH (ref 70–99)
Glucose-Capillary: 148 mg/dL — ABNORMAL HIGH (ref 70–99)
Glucose-Capillary: 145 mg/dL — ABNORMAL HIGH (ref 70–99)
Glucose-Capillary: 150 mg/dL — ABNORMAL HIGH (ref 70–99)
Glucose-Capillary: 182 mg/dL — ABNORMAL HIGH (ref 70–99)
Glucose-Capillary: 233 mg/dL — ABNORMAL HIGH (ref 70–99)
Glucose-Capillary: 222 mg/dL — ABNORMAL HIGH (ref 70–99)

## 2020-08-24 LAB — BASIC METABOLIC PANEL
Anion gap: 13 (ref 5–15)
BUN: 25 mg/dL — ABNORMAL HIGH (ref 6–20)
CO2: 34 mmol/L — ABNORMAL HIGH (ref 22–32)
Calcium: 9.1 mg/dL (ref 8.9–10.3)
Chloride: 96 mmol/L — ABNORMAL LOW (ref 98–111)
Creatinine, Ser: 0.36 mg/dL — ABNORMAL LOW (ref 0.61–1.24)
GFR, Estimated: >60 mL/min (ref >60)
Glucose, Bld: 145 mg/dL — ABNORMAL HIGH (ref 70–99)
Potassium: 3.9 mmol/L (ref 3.5–5.1)
Sodium: 143 mmol/L (ref 135–145)

## 2020-08-24 LAB — TRIGLYCERIDES: Triglycerides: 295 mg/dL — ABNORMAL HIGH (ref <150)

## 2020-08-24 LAB — PHOSPHORUS: Phosphorus: 3.7 mg/dL (ref 2.5–4.6)

## 2020-08-24 LAB — MAGNESIUM: Magnesium: 2 mg/dL (ref 1.7–2.4)

## 2020-08-24 MED ORDER — PROPOFOL 1000 MG/100ML IV EMUL
5.0000 ug/kg/min | INTRAVENOUS | Status: DC
  Administered 2020-08-24: 60 ug/kg/min via INTRAVENOUS
  Administered 2020-08-24 (×2): 30 ug/kg/min via INTRAVENOUS
  Administered 2020-08-24 – 2020-08-26 (×5): 20 ug/kg/min via INTRAVENOUS
  Administered 2020-08-27: 30 ug/kg/min via INTRAVENOUS
  Administered 2020-08-27 (×2): 20 ug/kg/min via INTRAVENOUS
  Administered 2020-08-27 – 2020-08-28 (×2): 30 ug/kg/min via INTRAVENOUS
  Administered 2020-08-28: 25 ug/kg/min via INTRAVENOUS
  Administered 2020-08-28 (×2): 30 ug/kg/min via INTRAVENOUS
  Administered 2020-08-28: 50 ug/kg/min via INTRAVENOUS
  Administered 2020-08-29: 40 ug/kg/min via INTRAVENOUS
  Administered 2020-08-29: 30 ug/kg/min via INTRAVENOUS
  Administered 2020-08-29: 50 ug/kg/min via INTRAVENOUS
  Administered 2020-08-29: 40 ug/kg/min via INTRAVENOUS
  Administered 2020-08-29: 50 ug/kg/min via INTRAVENOUS
  Administered 2020-08-29: 40 ug/kg/min via INTRAVENOUS
  Administered 2020-08-29 – 2020-08-30 (×2): 50 ug/kg/min via INTRAVENOUS
  Administered 2020-08-30: 40 ug/kg/min via INTRAVENOUS
  Administered 2020-08-30 (×2): 50 ug/kg/min via INTRAVENOUS
  Administered 2020-08-30: 45 ug/kg/min via INTRAVENOUS
  Administered 2020-08-31 (×2): 40 ug/kg/min via INTRAVENOUS
  Administered 2020-08-31: 20 ug/kg/min via INTRAVENOUS
  Administered 2020-08-31: 15 ug/kg/min via INTRAVENOUS
  Administered 2020-08-31 – 2020-09-01 (×5): 30 ug/kg/min via INTRAVENOUS
  Administered 2020-09-01 (×2): 40 ug/kg/min via INTRAVENOUS
  Administered 2020-09-02: 50 ug/kg/min via INTRAVENOUS
  Administered 2020-09-02: 30 ug/kg/min via INTRAVENOUS
  Administered 2020-09-02 (×3): 40 ug/kg/min via INTRAVENOUS
  Administered 2020-09-03 (×3): 50 ug/kg/min via INTRAVENOUS
  Administered 2020-09-03: 40 ug/kg/min via INTRAVENOUS
  Administered 2020-09-03 – 2020-09-04 (×5): 50 ug/kg/min via INTRAVENOUS
  Administered 2020-09-04: 45 ug/kg/min via INTRAVENOUS
  Administered 2020-09-04: 30 ug/kg/min via INTRAVENOUS
  Administered 2020-09-04: 50 ug/kg/min via INTRAVENOUS
  Administered 2020-09-05: 60 ug/kg/min via INTRAVENOUS
  Administered 2020-09-05: 30 ug/kg/min via INTRAVENOUS
  Administered 2020-09-05: 40 ug/kg/min via INTRAVENOUS
  Administered 2020-09-05: 35 ug/kg/min via INTRAVENOUS
  Administered 2020-09-06 (×6): 60 ug/kg/min via INTRAVENOUS
  Administered 2020-09-06: 55 ug/kg/min via INTRAVENOUS
  Administered 2020-09-06: 60 ug/kg/min via INTRAVENOUS
  Administered 2020-09-07: 55 ug/kg/min via INTRAVENOUS
  Administered 2020-09-07: 50 ug/kg/min via INTRAVENOUS
  Administered 2020-09-07: 55 ug/kg/min via INTRAVENOUS
  Administered 2020-09-07: 65 ug/kg/min via INTRAVENOUS
  Administered 2020-09-07: 60 ug/kg/min via INTRAVENOUS
  Administered 2020-09-07: 45 ug/kg/min via INTRAVENOUS
  Administered 2020-09-07: 60 ug/kg/min via INTRAVENOUS
  Administered 2020-09-07: 50 ug/kg/min via INTRAVENOUS
  Administered 2020-09-07: 60 ug/kg/min via INTRAVENOUS
  Administered 2020-09-08: 20 ug/kg/min via INTRAVENOUS
  Administered 2020-09-08: 60 ug/kg/min via INTRAVENOUS
  Administered 2020-09-08: 40 ug/kg/min via INTRAVENOUS
  Administered 2020-09-08: 70 ug/kg/min via INTRAVENOUS
  Filled 2020-08-24 (×25): qty 100
  Filled 2020-08-24: qty 200
  Filled 2020-08-24 (×15): qty 100
  Filled 2020-08-24: qty 200
  Filled 2020-08-24 (×34): qty 100
  Filled 2020-08-24: qty 200
  Filled 2020-08-24 (×7): qty 100

## 2020-08-24 MED ORDER — INSULIN ASPART 100 UNIT/ML ~~LOC~~ SOLN
2.0000 [IU] | SUBCUTANEOUS | Status: DC
  Administered 2020-08-24 (×2): 6 [IU] via SUBCUTANEOUS
  Administered 2020-08-24 – 2020-08-25 (×3): 4 [IU] via SUBCUTANEOUS
  Administered 2020-08-25: 2 [IU] via SUBCUTANEOUS
  Administered 2020-08-25: 6 [IU] via SUBCUTANEOUS
  Administered 2020-08-25 (×3): 4 [IU] via SUBCUTANEOUS
  Administered 2020-08-26 (×3): 6 [IU] via SUBCUTANEOUS
  Administered 2020-08-26: 4 [IU] via SUBCUTANEOUS
  Administered 2020-08-26 – 2020-08-27 (×3): 6 [IU] via SUBCUTANEOUS
  Administered 2020-08-27 – 2020-08-28 (×7): 4 [IU] via SUBCUTANEOUS
  Administered 2020-08-28: 6 [IU] via SUBCUTANEOUS
  Administered 2020-08-29: 2 [IU] via SUBCUTANEOUS
  Administered 2020-08-29 (×2): 4 [IU] via SUBCUTANEOUS
  Administered 2020-08-30 – 2020-08-31 (×8): 6 [IU] via SUBCUTANEOUS
  Administered 2020-08-31: 4 [IU] via SUBCUTANEOUS
  Administered 2020-08-31 (×2): 6 [IU] via SUBCUTANEOUS
  Administered 2020-08-31: 4 [IU] via SUBCUTANEOUS
  Administered 2020-09-01 (×2): 6 [IU] via SUBCUTANEOUS
  Administered 2020-09-01: 4 [IU] via SUBCUTANEOUS
  Administered 2020-09-01 (×2): 6 [IU] via SUBCUTANEOUS
  Administered 2020-09-01: 4 [IU] via SUBCUTANEOUS
  Administered 2020-09-02 (×4): 6 [IU] via SUBCUTANEOUS
  Administered 2020-09-02: 4 [IU] via SUBCUTANEOUS
  Administered 2020-09-02 – 2020-09-03 (×2): 2 [IU] via SUBCUTANEOUS
  Administered 2020-09-03 (×3): 6 [IU] via SUBCUTANEOUS
  Administered 2020-09-04: 2 [IU] via SUBCUTANEOUS
  Administered 2020-09-04: 4 [IU] via SUBCUTANEOUS
  Administered 2020-09-04 (×2): 6 [IU] via SUBCUTANEOUS
  Administered 2020-09-04: 4 [IU] via SUBCUTANEOUS
  Administered 2020-09-04: 6 [IU] via SUBCUTANEOUS
  Administered 2020-09-05 (×2): 2 [IU] via SUBCUTANEOUS
  Administered 2020-09-05 – 2020-09-06 (×3): 4 [IU] via SUBCUTANEOUS
  Administered 2020-09-06: 2 [IU] via SUBCUTANEOUS
  Administered 2020-09-06: 4 [IU] via SUBCUTANEOUS
  Administered 2020-09-06 – 2020-09-07 (×2): 2 [IU] via SUBCUTANEOUS
  Administered 2020-09-07: 4 [IU] via SUBCUTANEOUS
  Administered 2020-09-09 – 2020-09-10 (×3): 2 [IU] via SUBCUTANEOUS
  Administered 2020-09-11 (×2): 4 [IU] via SUBCUTANEOUS
  Administered 2020-09-11: 6 [IU] via SUBCUTANEOUS
  Administered 2020-09-11 (×3): 4 [IU] via SUBCUTANEOUS
  Administered 2020-09-12 (×5): 6 [IU] via SUBCUTANEOUS
  Administered 2020-09-12: 4 [IU] via SUBCUTANEOUS
  Administered 2020-09-13: 6 [IU] via SUBCUTANEOUS
  Administered 2020-09-13: 2 [IU] via SUBCUTANEOUS
  Administered 2020-09-13 (×2): 6 [IU] via SUBCUTANEOUS
  Administered 2020-09-13 (×2): 4 [IU] via SUBCUTANEOUS
  Filled 2020-08-24 (×95): qty 1

## 2020-08-24 MED ORDER — INSULIN DETEMIR 100 UNIT/ML ~~LOC~~ SOLN
35.0000 [IU] | Freq: Two times a day (BID) | SUBCUTANEOUS | Status: DC
  Administered 2020-08-24 – 2020-08-26 (×5): 35 [IU] via SUBCUTANEOUS
  Filled 2020-08-24 (×7): qty 0.35

## 2020-08-24 MED ORDER — INSULIN ASPART 100 UNIT/ML ~~LOC~~ SOLN
12.0000 [IU] | SUBCUTANEOUS | Status: DC
  Administered 2020-08-24 – 2020-08-26 (×12): 12 [IU] via SUBCUTANEOUS
  Filled 2020-08-24 (×12): qty 1

## 2020-08-24 MED ORDER — HYDRALAZINE HCL 20 MG/ML IJ SOLN
20.0000 mg | Freq: Once | INTRAMUSCULAR | Status: AC
  Administered 2020-08-24: 20 mg via INTRAVENOUS

## 2020-08-24 MED ORDER — PROPOFOL 1000 MG/100ML IV EMUL
INTRAVENOUS | Status: AC
  Administered 2020-08-24: 20 ug/kg/min via INTRAVENOUS
  Filled 2020-08-24: qty 100

## 2020-08-24 MED ORDER — SODIUM CHLORIDE 0.9 % IV SOLN
1.0000 mg/h | INTRAVENOUS | Status: DC
  Administered 2020-08-24: 1 mg/h via INTRAVENOUS
  Administered 2020-08-25 – 2020-08-27 (×5): 5 mg/h via INTRAVENOUS
  Administered 2020-08-27 – 2020-08-29 (×3): 3 mg/h via INTRAVENOUS
  Administered 2020-08-30: 2 mg/h via INTRAVENOUS
  Administered 2020-08-31: 4 mg/h via INTRAVENOUS
  Administered 2020-09-01 – 2020-09-04 (×6): 3 mg/h via INTRAVENOUS
  Filled 2020-08-24 (×17): qty 5

## 2020-08-24 MED ORDER — HYDRALAZINE HCL 20 MG/ML IJ SOLN
INTRAMUSCULAR | Status: AC
  Filled 2020-08-24: qty 1

## 2020-08-24 NOTE — Progress Notes (Signed)
CRITICAL CARE NOTE 30 yo male recent hospitalization for perforated diverticulitis s/p colostomy with reversal and leakage of anastomosis with s/p revision now acutely hypoxemic with COVID19 induced severe ARDS.   INTERVAL EVENTS: 08/11/20-patient continued to decline with worsening respiratory status despite 100%Fio2 on BIPAP and HFNC failure.  He stated that he feels he is dying and cannot breathe.  I discussed with wife [name] earlier today that patient is critically ill may need ETT, she is agreeable and thankful for care. I specifically discussed and explained intubation and mechanical ventilation to patient and he wishes to proceed.   08/12/20- patient was weaned on FiO2 to 60%. Spoke to wife [name].  08/13/20- Patient weaned to 40%.  During SBT today he self extubated and was placed on HFNC.  He subsequently became hypoxemic, disoriented aggitated, removed PIVs and colostomy and proceeded to have combative behavior with worsening oxygenation. I have attempted to calm patient and encouraged him to cooperate.  After numerous attempts patient continued to be combative with severe aggitation with combative behavior and would not wear oxygen. Patient was placed on sedation and MV.  Wife updated.   08/14/20- patient remains critically ill.  Weaned from 100%>>55%. Called and updated wife [name] today 12/27-Patient with worsening oxygenation on FiO2 100% peep of 14 08/16/20-Patient with continued high oxygen requirements overnight. Have spoken with surgery and there should be no problem proning patient with this. Was febrile yesterday and thus broad spectrum abx started and cultures attained.  08-17-20-patient down to PEEP 10 FiO2 55%.  Patient never required proning 08/18/20: FiO2 weaned to 40%, PEEP 10, plan for diuresis with Diamox today, adding PO Clonazepam to assist weaning sedation 08/19/20: Vent changed to Pressure Control this morning: 50% FiO2, 24, 22/5, Plan for Recruitment maneuvers and  diuresis 08/20/2020: Continued issues with hypercapnia due to the dead space ventilation.  Worsening despite pressure control.  Switch back to [facility], prone positioning 08/23/19- patient proned today without incident, continue to wean FiO2 , currently on 50% 08/24/19- patient weaned to 40% FiO2, plan to continue proning with full scope of care.  I have spoken to [name] (wife of patient today) she would like to be present prior to next extubation attempt to help console patient and decrease his aggitation.     CC  follow up respiratory failure  SUBJECTIVE Patient remains critically ill Prognosis is guarded   Vent Mode: PRVC FiO2 (%):  [40 %-50 %] 50 % Set Rate:  [30 bmp] 30 bmp Vt Set:  [550 mL] 550 mL PEEP:  [5 cmH20] 5 cmH20  CBC    Component Value Date/Time   WBC 9.1 08/24/2020 0459   RBC 3.44 (L) 08/24/2020 0459   HGB 9.8 (L) 08/24/2020 0459   HCT 31.9 (L) 08/24/2020 0459   PLT 219 08/24/2020 0459   MCV 92.7 08/24/2020 0459   MCH 28.5 08/24/2020 0459   MCHC 30.7 08/24/2020 0459   RDW 15.4 08/24/2020 0459   LYMPHSABS 1.0 08/24/2020 0459   MONOABS 0.7 08/24/2020 0459   EOSABS 0.0 08/24/2020 0459   BASOSABS 0.0 08/24/2020 0459   BMP Latest Ref Rng & Units 08/24/2020 08/23/2020 08/22/2020  Glucose 70 - 99 mg/dL 145(H) 194(H) 213(H)  BUN 6 - 20 mg/dL 25(H) 27(H) 24(H)  Creatinine 0.61 - 1.24 mg/dL 0.36(L) 0.43(L) 0.39(L)  Sodium 135 - 145 mmol/L 143 145 143  Potassium 3.5 - 5.1 mmol/L 3.9 4.1 4.2  Chloride 98 - 111 mmol/L 96(L) 102 104  CO2 22 - 32 mmol/L 34(H) 35(H)  33(H)  Calcium 8.9 - 10.3 mg/dL 9.1 8.8(L) 8.9     BP (!) 120/59   Pulse 75   Temp (!) 95 F (35 C)   Resp (!) 30   Ht 6' (1.829 m)   Wt 112.1 kg   SpO2 96%   BMI 33.52 kg/m    I/O last 3 completed shifts: In: 807 [I.V.:807] Out: 4350 [Urine:3650; Stool:700] No intake/output data recorded.  SpO2: 96 % O2 Flow Rate (L/min): 50 L/min FiO2 (%): 50 %  Estimated body mass index is 33.52 kg/m as  calculated from the following:   Height as of this encounter: 6' (1.829 m).   Weight as of this encounter: 112.1 kg.  SIGNIFICANT EVENTS   REVIEW OF SYSTEMS  PATIENT IS UNABLE TO PROVIDE COMPLETE REVIEW OF SYSTEMS DUE TO SEVERE CRITICAL ILLNESS        PHYSICAL EXAMINATION:  GENERAL:critically ill appearing, +resp distress HEAD: Normocephalic, atraumatic.  EYES: Pupils equal, round, reactive to light.  No scleral icterus.  MOUTH: Moist mucosal membrane. NECK: Supple.  PULMONARY: +rhonchi, +wheezing CARDIOVASCULAR: S1 and S2. Regular rate and rhythm. No murmurs, rubs, or gallops.  GASTROINTESTINAL: Soft, nontender, -distended.  Positive bowel sounds.   MUSCULOSKELETAL: No swelling, clubbing, or edema.  NEUROLOGIC: obtunded, GCS<8 SKIN:intact,warm,dry  MEDICATIONS: I have reviewed all medications and confirmed regimen as documented   CULTURE RESULTS   Recent Results (from the past 240 hour(s))  CULTURE, BLOOD (ROUTINE X 2) w Reflex to ID Panel     Status: None   Collection Time: 08/15/20  1:14 PM   Specimen: BLOOD  Result Value Ref Range Status   Specimen Description BLOOD BLOOD LEFT HAND  Final   Special Requests   Final    BOTTLES DRAWN AEROBIC AND ANAEROBIC Blood Culture adequate volume   Culture   Final    NO GROWTH 5 DAYS Performed at [facility], [address], [place], [place] [address]    Report Status 08/20/2020 FINAL  Final  Culture, respiratory (non-expectorated)     Status: None   Collection Time: 08/15/20  2:00 PM   Specimen: Tracheal Aspirate; Respiratory  Result Value Ref Range Status   Specimen Description   Final    TRACHEAL ASPIRATE Performed at [facility], [address] [place], [place] [address]    Special Requests   Final    NONE Performed at [facility], [address] [place], [place] [address]    Gram Stain   Final    FEW WBC PRESENT, PREDOMINANTLY PMN FEW GRAM POSITIVE COCCI IN CLUSTERS IN  CHAINS FEW YEAST    Culture   Final    Consistent with normal respiratory flora. Performed at [facility], [place] [address], [place],  [address]    Report Status 08/18/2020 FINAL  Final  CULTURE, BLOOD (ROUTINE X 2) w Reflex to ID Panel     Status: None   Collection Time: 08/15/20  2:34 PM   Specimen: BLOOD  Result Value Ref Range Status   Specimen Description BLOOD BLOOD LEFT HAND  Final   Special Requests   Final    BOTTLES DRAWN AEROBIC AND ANAEROBIC Blood Culture adequate volume   Culture   Final    NO GROWTH 5 DAYS Performed at [facility], [address], [place],  [address]    Report Status 08/20/2020 FINAL  Final          IMAGING    No results found.  Nutrition Status: Nutrition Problem: Inadequate oral intake Etiology: inability to eat (pt sedated and ventilated) Signs/Symptoms: NPO status       Indwelling Urinary Catheter continued, requirement due to   Reason to continue Indwelling Urinary Catheter strict Intake/Output monitoring for hemodynamic instability   Central Line/ continued, requirement due to  Reason to continue [place] of central venous pressure or other hemodynamic parameters and poor IV access   Ventilator continued, requirement due to severe respiratory failure   Ventilator Sedation RASS 0 to -2      ASSESSMENT AND PLAN SYNOPSIS Acute Hypoxic Respiratory Failure in the setting of COVID-19 Pneumonia with severe ARDS  perforated diverticulitis s/p colostomy with reversal and leakage of anastomosis with s/p revision now acutely hypoxemic with COVID19 induced severe ARDS.  Severe ACUTE Hypoxic and Hypercapnic Respiratory Failure -continue Full MV support -continue Bronchodilator Therapy -Wean Fio2 and PEEP as tolerated -will perform SAT/SBT when respiratory parameters are met -VAP/VENT bundle implementation  ACUTE DIASTOLIC CARDIAC FAILURE-  -oxygen as needed -Lasix as  tolerated   Morbid obesity, possible OSA.   Will certainly impact respiratory mechanics, ventilator weaning Suspect will need to consider additional PEEP, possible extubation to BiPAP when appropriate to consider    NEUROLOGY - intubated and sedated - minimal sedation to achieve a RASS goal: -1 Wake up assessment pending  CARDIAC ICU monitoring  ID -continue IV abx as prescibed -follow up cultures  GI GI PROPHYLAXIS as indicated  NUTRITIONAL STATUS Nutrition Status: Nutrition Problem: Inadequate oral intake Etiology: inability to eat (pt sedated and ventilated) Signs/Symptoms: NPO status     DIET-->TF's as tolerated Constipation protocol as indicated  ENDO - will use ICU hypoglycemic\Hyperglycemia protocol if indicated     ELECTROLYTES -follow labs as needed -replace as needed -pharmacy consultation and following   DVT/GI PRX ordered and assessed TRANSFUSIONS AS NEEDED MONITOR FSBS I Assessed the need for Labs I Assessed the need for Foley I Assessed the need for Central Venous Line Family Discussion when available I Assessed the need for Mobilization I made an Assessment of medications to be adjusted accordingly Safety Risk assessment completed   CASE DISCUSSED IN MULTIDISCIPLINARY ROUNDS WITH ICU TEAM  Critical Care Time devoted to patient care services described in this note is 45 minutes.   Overall, patient is critically ill, prognosis is guarded.  Patient with Multiorgan failure and at high risk for cardiac arrest and death.    Corrin Parker, M.D.  [facility]  Medical Director [place] Director [facility] Cardio-Pulmonary Department

## 2020-08-24 NOTE — Progress Notes (Signed)
Inpatient Diabetes Program Recommendations  AACE/ADA: New Consensus Statement on Inpatient Glycemic Control (2015)  Target Ranges:  Prepandial:   less than 140 mg/dL      Peak postprandial:   less than 180 mg/dL (1-2 hours)      Critically ill patients:  140 - 180 mg/dL   Lab Results  Component Value Date   GLUCAP 148 (H) 08/24/2020   HGBA1C 5.7 (H) 08/10/2020    Review of Glycemic Control Results for KRUZ, CHIU (MRN [id number]) as of 08/24/2020 10:52  Ref. Range 08/24/2020 02:44 08/24/2020 03:49 08/24/2020 04:49 08/24/2020 07:07 08/24/2020 09:13  Glucose-Capillary Latest Ref Range: 70 - 99 mg/dL 004 (H) 599 (H) 774 (H) 148 (H) 148 (H)   Diabetes history: None Current orders for Inpatient glycemic control:  IV insulin- ICU protocol Decadron 6 mg daily Inpatient Diabetes Program Recommendations:    Note insulin drip rates down to 7.5 units/hr- Patient may transition if appropriate using Phase 3 of ICU glycemic control order set.  Based on insulin drip rate of 7.5 units/ hr, consider Levemir 35 units bid, Novolog 12 units q 4 hours (tube feed coverage-hold if tube feeds held for any reason), and Novolog standard correction q 4 hours. As steroids reduced, will need to also reduce insulin.   Thanks,  Beryl Meager, RN, BC-ADM Inpatient Diabetes Coordinator Pager [phone] (8a-5p)

## 2020-08-24 NOTE — Progress Notes (Signed)
Patients sedation weaned down once wife arrived. Patient had labored breathing, dyssynchronous with vent, and hypertensive. Per Dr. Belia Heman placed back on full sedation, hydralazine ordered. Vec given. MRI orders placed. Per MRI will contact wife and [place] tonight for scheduling.

## 2020-08-24 NOTE — Progress Notes (Signed)
GOALS OF CARE DISCUSSION  The Clinical status was relayed to family in detail-wife Craig [name] At bedside  Updated and notified of patients medical condition.  Patient remains unresponsive and will not open eyes to command.    Nasopharyngeal suction produced copious sanguineous secretions.    Patient with increased WOB and using accessory muscles to breathe.  Explained to family course of therapy and the modalities    Family understands the situation.  Family are satisfied with Plan of action and management. All questions answered  Additional CC time 32 mins   Craig [name] Santiago Glad, M.D.  [facility]  Medical Director [facility] [place] [facility] Medical Director [facility] Cardio-Pulmonary Department

## 2020-08-25 ENCOUNTER — Inpatient Hospital Stay: Payer: No Typology Code available for payment source

## 2020-08-25 DIAGNOSIS — J9601 Acute respiratory failure with hypoxia: Secondary | ICD-10-CM | POA: Diagnosis not present

## 2020-08-25 DIAGNOSIS — A419 Sepsis, unspecified organism: Secondary | ICD-10-CM | POA: Diagnosis not present

## 2020-08-25 DIAGNOSIS — U071 COVID-19: Secondary | ICD-10-CM | POA: Diagnosis not present

## 2020-08-25 DIAGNOSIS — R6520 Severe sepsis without septic shock: Secondary | ICD-10-CM | POA: Diagnosis not present

## 2020-08-25 LAB — CBC WITH DIFFERENTIAL/PLATELET
Abs Immature Granulocytes: 0.57 10*3/uL — ABNORMAL HIGH (ref 0.00–0.07)
Basophils Absolute: 0 10*3/uL (ref 0.0–0.1)
Basophils Relative: 0 %
Eosinophils Absolute: 0 10*3/uL (ref 0.0–0.5)
Eosinophils Relative: 0 %
HCT: 32 % — ABNORMAL LOW (ref 39.0–52.0)
Hemoglobin: 9.7 g/dL — ABNORMAL LOW (ref 13.0–17.0)
Immature Granulocytes: 6 %
Lymphocytes Relative: 7 %
Lymphs Abs: 0.7 10*3/uL (ref 0.7–4.0)
MCH: 28.4 pg (ref 26.0–34.0)
MCHC: 30.3 g/dL (ref 30.0–36.0)
MCV: 93.8 fL (ref 80.0–100.0)
Monocytes Absolute: 0.8 10*3/uL (ref 0.1–1.0)
Monocytes Relative: 8 %
Neutro Abs: 7.7 10*3/uL (ref 1.7–7.7)
Neutrophils Relative %: 79 %
Platelets: 216 10*3/uL (ref 150–400)
RBC: 3.41 MIL/uL — ABNORMAL LOW (ref 4.22–5.81)
RDW: 15.9 % — ABNORMAL HIGH (ref 11.5–15.5)
Smear Review: NORMAL
WBC: 9.7 10*3/uL (ref 4.0–10.5)
nRBC: 1 % — ABNORMAL HIGH (ref 0.0–0.2)

## 2020-08-25 LAB — BLOOD GAS, ARTERIAL
Acid-Base Excess: 11.7 mmol/L — ABNORMAL HIGH (ref 0.0–2.0)
Allens test (pass/fail): POSITIVE — AB
Bicarbonate: 37.8 mmol/L — ABNORMAL HIGH (ref 20.0–28.0)
FIO2: 0.65
MECHVT: 550 mL
O2 Saturation: 96.1 %
PEEP: 10 cmH2O
Patient temperature: 37
RATE: 30 resp/min
pCO2 arterial: 57 mmHg — ABNORMAL HIGH (ref 32.0–48.0)
pH, Arterial: 7.43 (ref 7.350–7.450)
pO2, Arterial: 80 mmHg — ABNORMAL LOW (ref 83.0–108.0)

## 2020-08-25 LAB — BASIC METABOLIC PANEL
Anion gap: 9 (ref 5–15)
BUN: 22 mg/dL — ABNORMAL HIGH (ref 6–20)
CO2: 33 mmol/L — ABNORMAL HIGH (ref 22–32)
Calcium: 8.9 mg/dL (ref 8.9–10.3)
Chloride: 99 mmol/L (ref 98–111)
Creatinine, Ser: 0.33 mg/dL — ABNORMAL LOW (ref 0.61–1.24)
GFR, Estimated: >60 mL/min (ref >60)
Glucose, Bld: 191 mg/dL — ABNORMAL HIGH (ref 70–99)
Potassium: 4.5 mmol/L (ref 3.5–5.1)
Sodium: 141 mmol/L (ref 135–145)

## 2020-08-25 LAB — C-REACTIVE PROTEIN: CRP: 1 mg/dL — ABNORMAL HIGH (ref <1.0)

## 2020-08-25 LAB — GLUCOSE, CAPILLARY
Glucose-Capillary: 138 mg/dL — ABNORMAL HIGH (ref 70–99)
Glucose-Capillary: 159 mg/dL — ABNORMAL HIGH (ref 70–99)
Glucose-Capillary: 161 mg/dL — ABNORMAL HIGH (ref 70–99)
Glucose-Capillary: 170 mg/dL — ABNORMAL HIGH (ref 70–99)
Glucose-Capillary: 172 mg/dL — ABNORMAL HIGH (ref 70–99)
Glucose-Capillary: 183 mg/dL — ABNORMAL HIGH (ref 70–99)
Glucose-Capillary: 228 mg/dL — ABNORMAL HIGH (ref 70–99)

## 2020-08-25 LAB — MAGNESIUM: Magnesium: 2.1 mg/dL (ref 1.7–2.4)

## 2020-08-25 LAB — TRIGLYCERIDES: Triglycerides: 333 mg/dL — ABNORMAL HIGH (ref <150)

## 2020-08-25 LAB — PHOSPHORUS: Phosphorus: 3.4 mg/dL (ref 2.5–4.6)

## 2020-08-25 IMAGING — DX DG CHEST 1V PORT
2 series · 2 of 2 positions shown · non-contrast
Comparison: [DATE]

CLINICAL DATA: [HH]

EXAM:
PORTABLE CHEST 1 VIEW

[chest ap (1 of 2)]
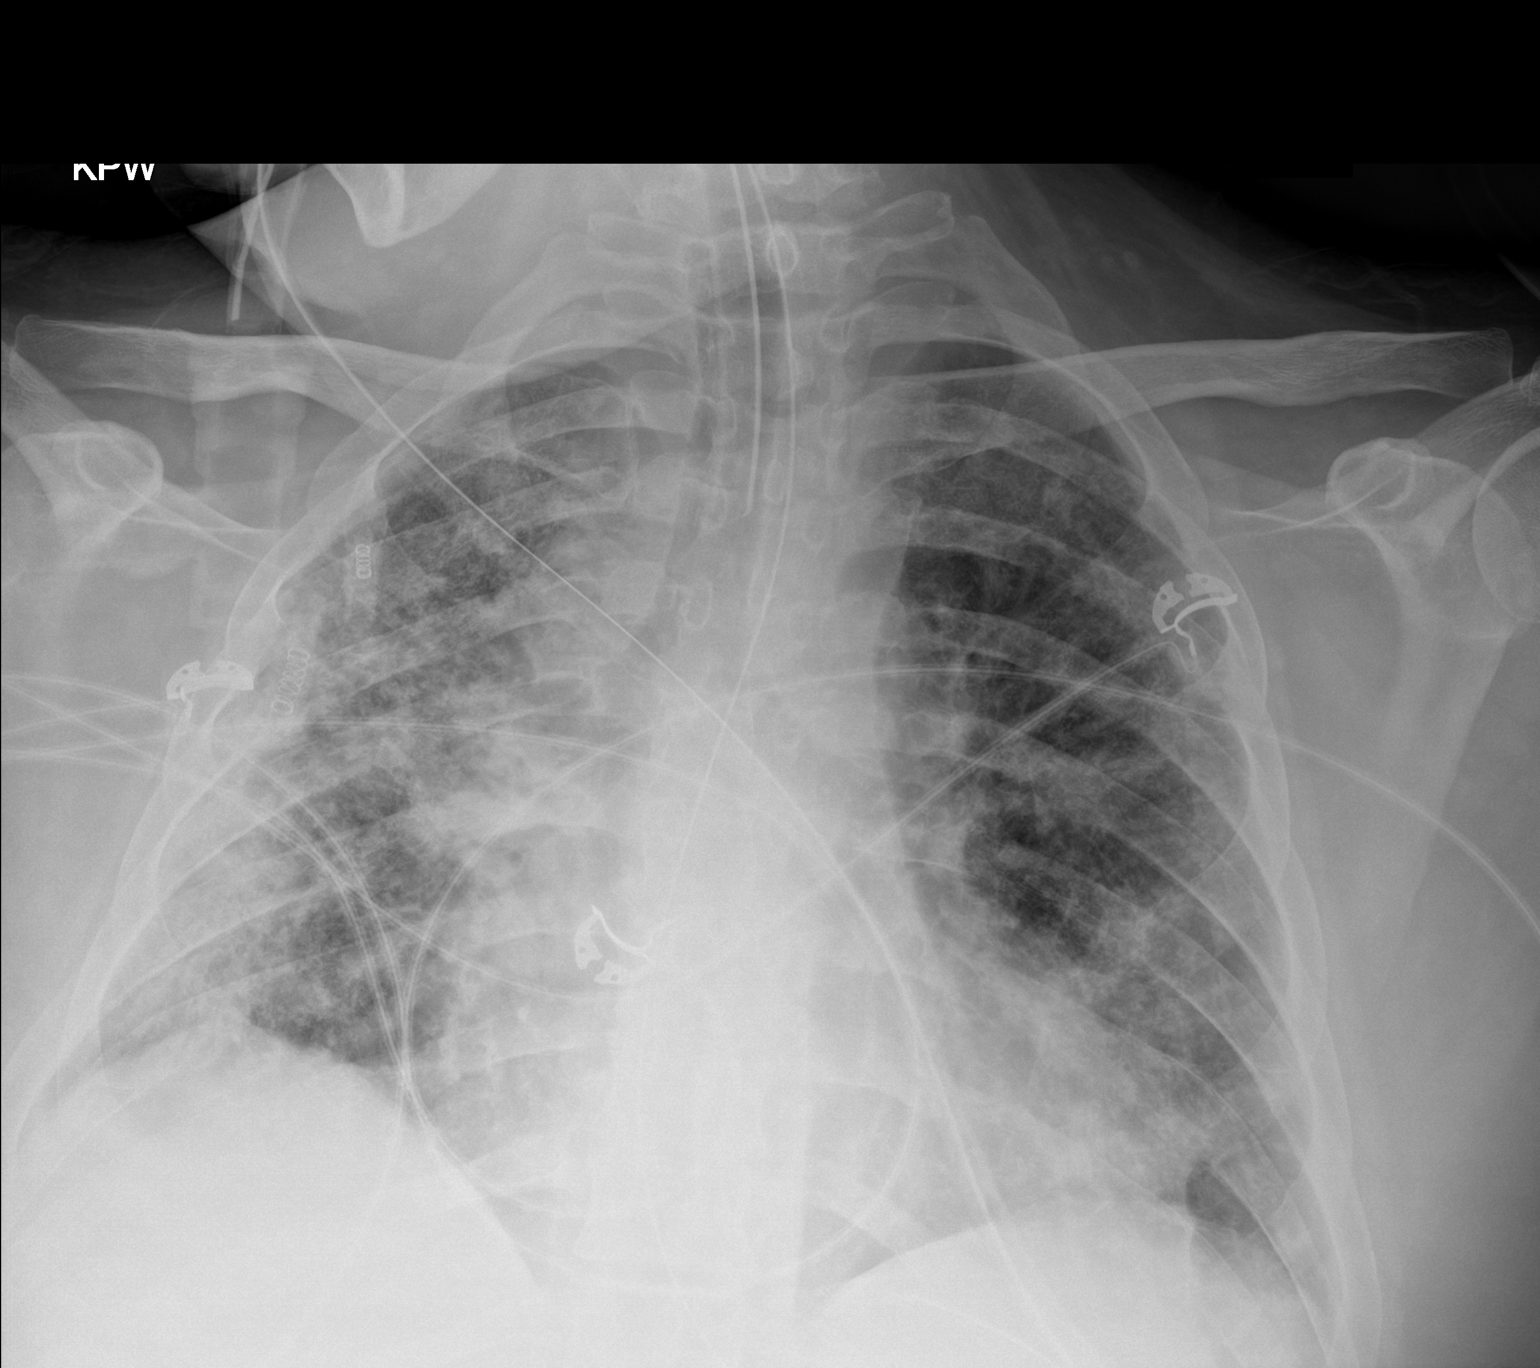

[chest ap (2 of 2)]
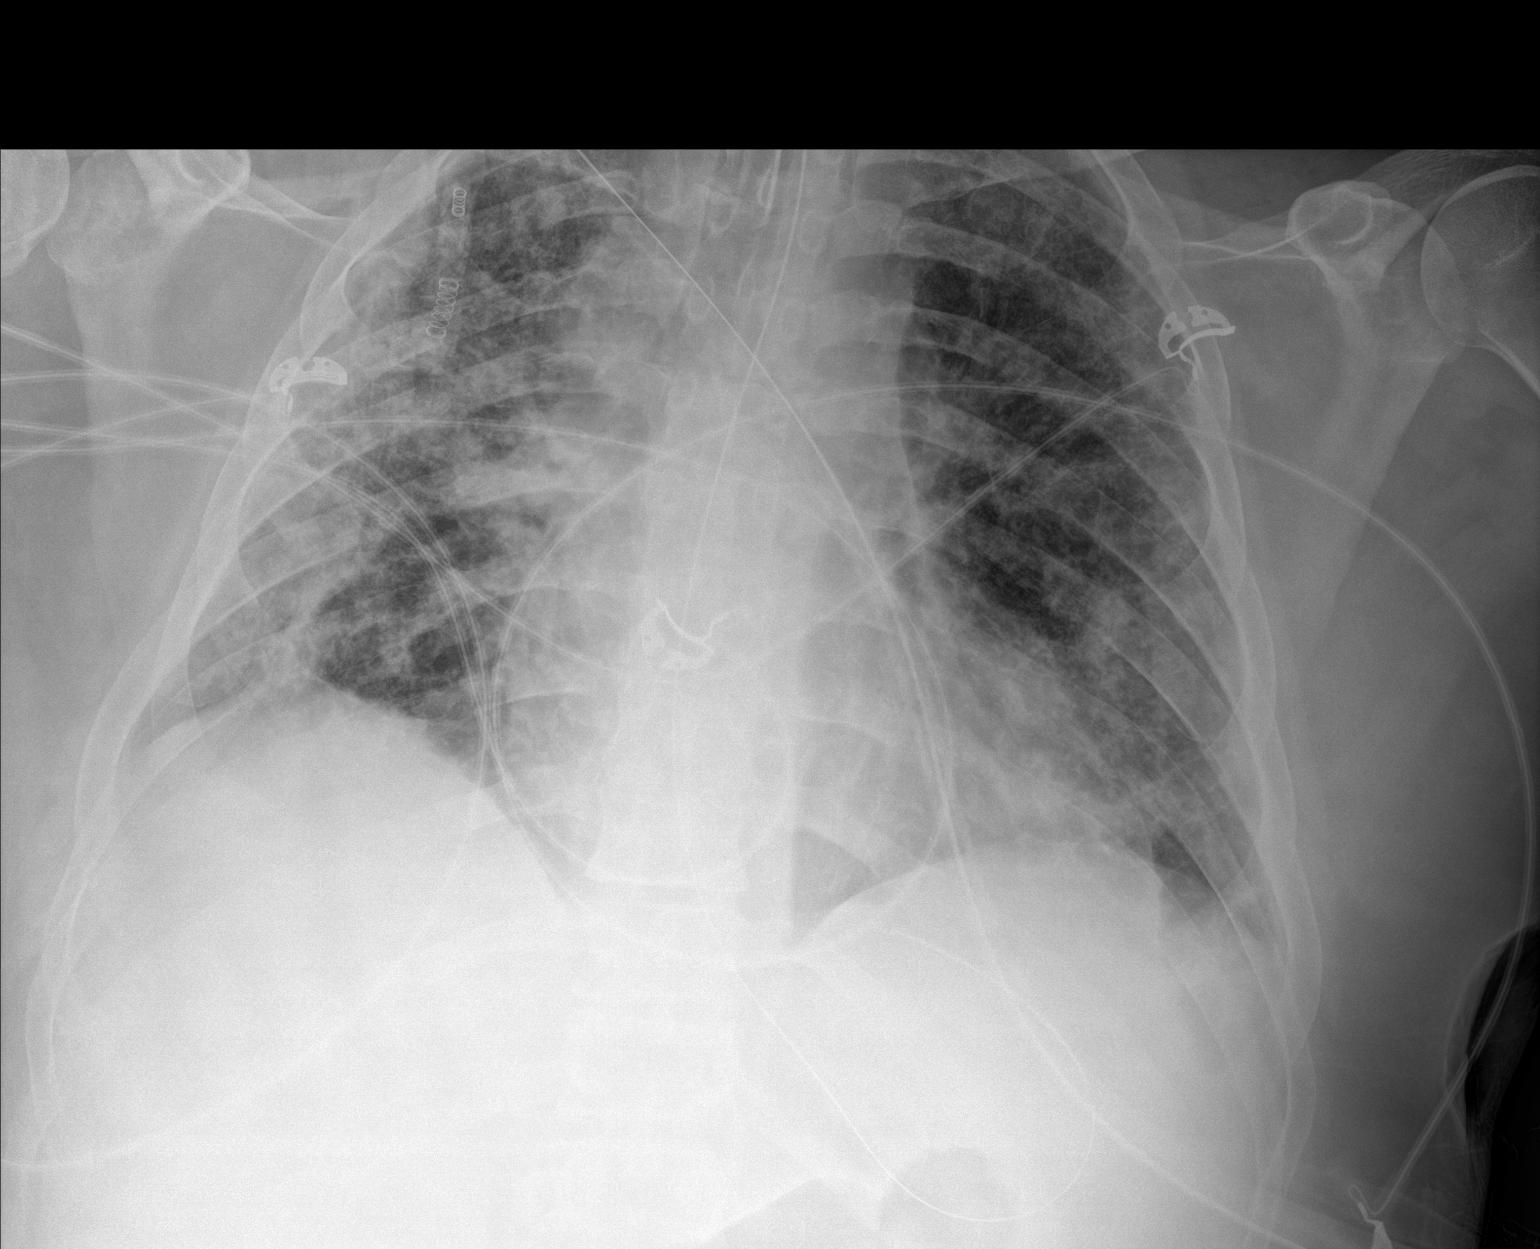

[2 of 2 positions shown; findings below may reference images not displayed]

FINDINGS: Endotracheal tube and partially imaged enteric tube are unchanged.
Patient is rotated. Persistent bilateral pulmonary opacities, right
greater than left. No significant pleural effusion. No pneumothorax.
IMPRESSION: Persistent bilateral pulmonary opacities, right greater than left.
Aeration is stable to minimally improved.

## 2020-08-25 IMAGING — MR MR HEAD W/O CM
12 series · 48 of 48 positions shown · non-contrast
Comparison: None.

CLINICAL DATA: Anoxic brain injury.  COVID with ARDS.

EXAM:
MRI HEAD WITHOUT CONTRAST
TECHNIQUE: Multiplanar, multiecho pulse sequences of the brain and surrounding
structures were obtained without intravenous contrast.

[Series 2: ax dwi_tracew · axial · 3.0mm · 1.31mm/px · z∈[-135,+16]mm · 5 of 48 slices shown]
[im 1/48]
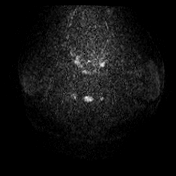
[im 12/48]
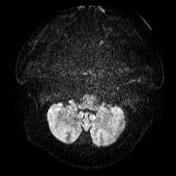
[im 24/48]
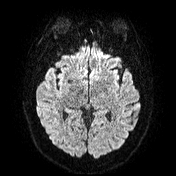
[im 36/48]
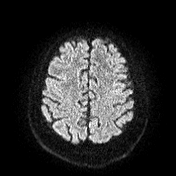
[im 48/48]
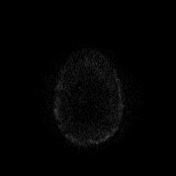

[Series 3: ax dwi_adc · axial · 3.0mm · 1.31mm/px · z∈[-135,+16]mm · 4 of 48 slices shown]
[im 1/48]
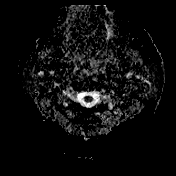
[im 16/48]
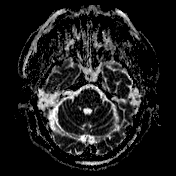
[im 32/48]
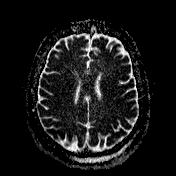
[im 48/48]
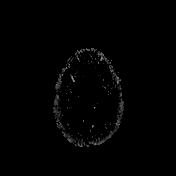

[Series 4: cor dwi_tracew · coronal · 5.0mm · 1.31mm/px · 3 of 38 slices shown]
[im 1/38]
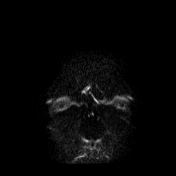
[im 19/38]
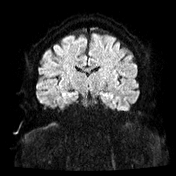
[im 38/38]
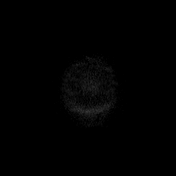

[Series 5: cor dwi_adc · coronal · 5.0mm · 1.31mm/px · 3 of 38 slices shown]
[im 1/38]
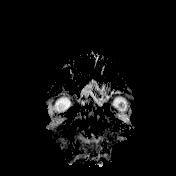
[im 19/38]
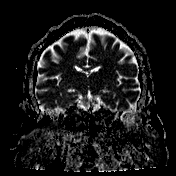
[im 38/38]
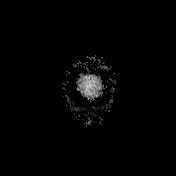

[Series 6: T1 · sagittal · 5.0mm · 0.94mm/px · 2 of 23 slices shown (1 of 2)]
[im 1/23]
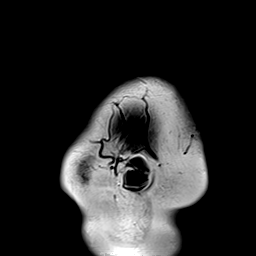
[im 23/23]
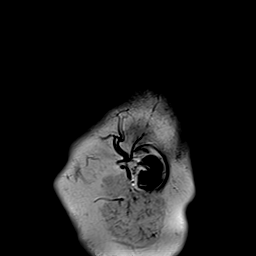

[Series 7: T2 · axial · 5.0mm · 0.45mm/px · z∈[-136,+17]mm · 2 of 27 slices shown (1 of 2)]
[im 1/27]
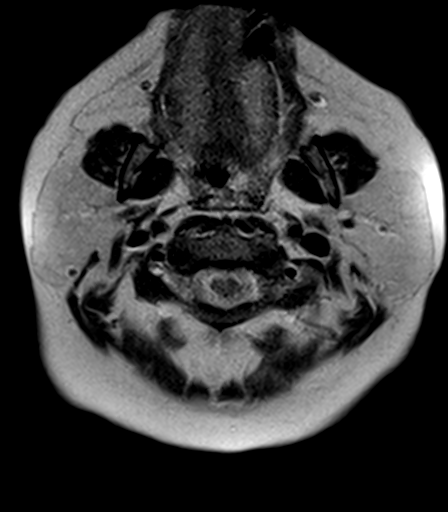
[im 27/27]
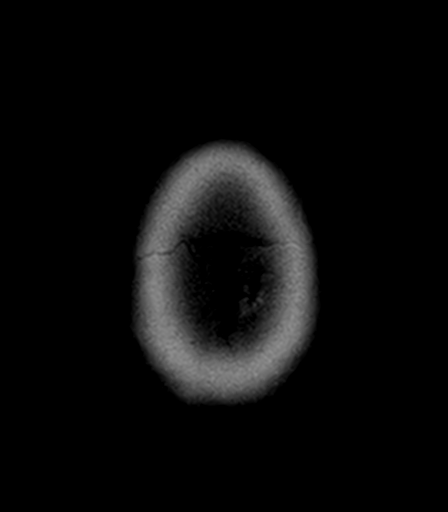

[Series 8: mag_images · axial · 3.0mm · 0.90mm/px · z∈[-134,+15]mm · 4 of 52 slices shown]
[im 1/52]
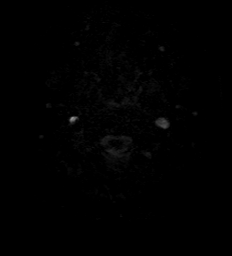
[im 18/52]
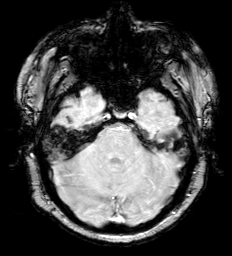
[im 35/52]
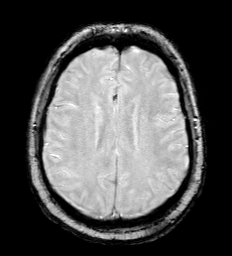
[im 52/52]
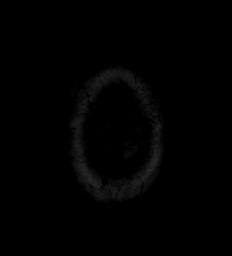

[Series 9: pha_images · axial · 3.0mm · 0.90mm/px · z∈[-134,+15]mm · 4 of 52 slices shown]
[im 1/52]
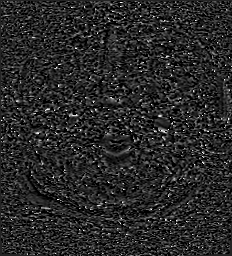
[im 18/52]
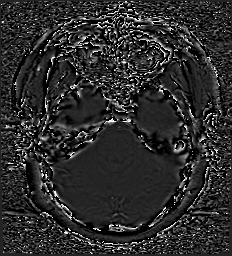
[im 35/52]
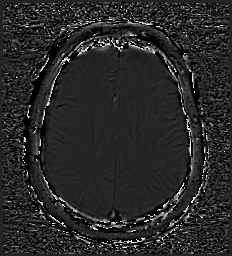
[im 52/52]
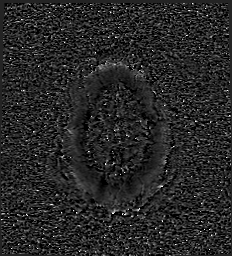

[Series 10: swi_images · axial · 3.0mm · 0.90mm/px · z∈[-134,+15]mm · 4 of 52 slices shown]
[im 1/52]
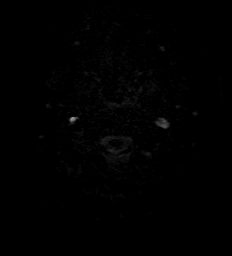
[im 18/52]
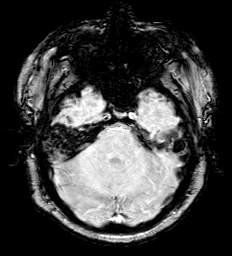
[im 35/52]
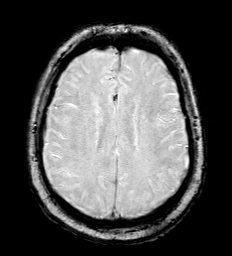
[im 52/52]
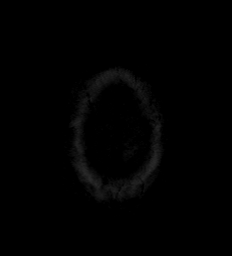

[Series 12: FLAIR · axial · 5.0mm · 1.20mm/px · z∈[-136,+16]mm · 2 of 27 slices shown]
[im 1/27]
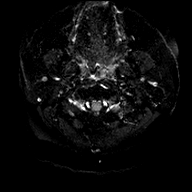
[im 27/27]
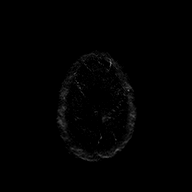

[Series 13: T1 · axial · 1.0mm · 0.98mm/px · z∈[-139,+16]mm · 13 of 160 slices shown (2 of 2)]
[im 1/160]
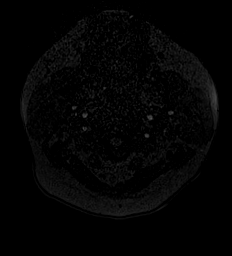
[im 14/160]
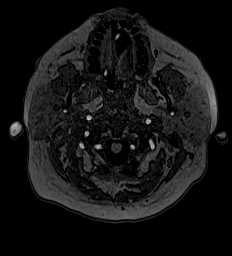
[im 27/160]
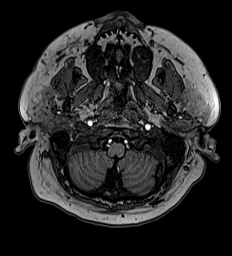
[im 40/160]
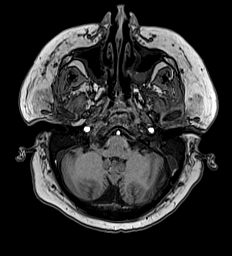
[im 54/160]
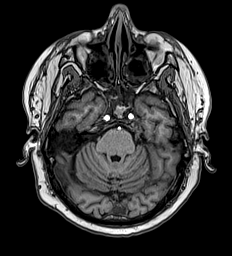
[im 67/160]
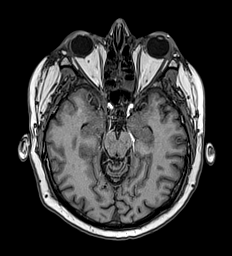
[im 80/160]
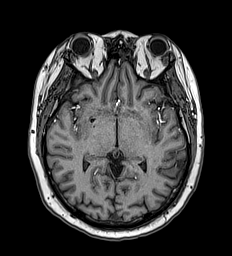
[im 93/160]
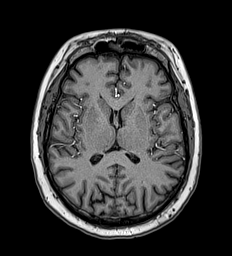
[im 107/160]
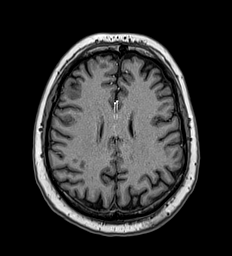
[im 120/160]
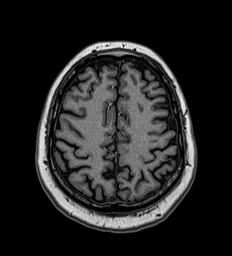
[im 133/160]
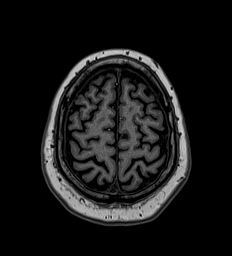
[im 146/160]
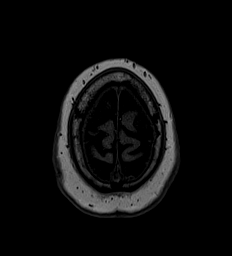
[im 160/160]
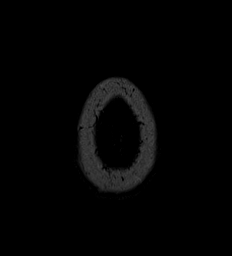

[Series 14: T2 · coronal · 5.0mm · 0.45mm/px · 2 of 31 slices shown (2 of 2)]
[im 1/31]
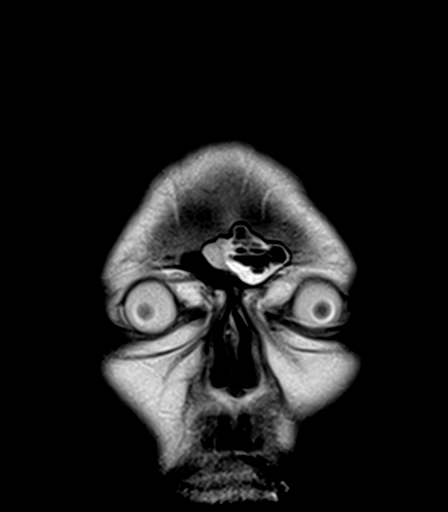
[im 31/31]
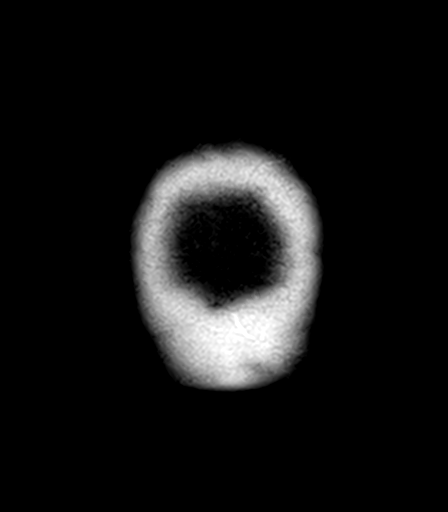

[48 of 48 positions shown; findings below may reference images not displayed]

FINDINGS: Brain: No acute infarction, hemorrhage, hydrocephalus, extra-axial
collection or mass lesion. No sign of anoxic injury. No chronic
white matter disease or atrophy.

Vascular: Normal flow voids.

Skull and upper cervical spine: Normal marrow signal

Sinuses/Orbits: Diffuse sinus opacification by mucosal thickening
with left maxillary fluid level. Confluent opacification of the
bilateral middle ear and mastoids. There is nasopharyngeal fluid in
the setting of intubation.
IMPRESSION: 1. Normal MRI of the brain.
2. Diffuse sinusitis and otomastoid opacification in the setting of
intubation.

## 2020-08-25 MED ORDER — DEXAMETHASONE 4 MG PO TABS
6.0000 mg | ORAL_TABLET | Freq: Every day | ORAL | Status: DC
  Administered 2020-08-26: 6 mg
  Filled 2020-08-25: qty 1.5

## 2020-08-25 MED ORDER — VECURONIUM BROMIDE 10 MG IV SOLR
20.0000 mg | Freq: Once | INTRAVENOUS | Status: AC
  Administered 2020-08-25: 20 mg via INTRAVENOUS
  Filled 2020-08-25: qty 20

## 2020-08-25 MED ORDER — FENTANYL 100 MCG/HR TD PT72
1.0000 | MEDICATED_PATCH | TRANSDERMAL | Status: DC
  Administered 2020-08-25 – 2020-09-06 (×5): 1 via TRANSDERMAL
  Filled 2020-08-25 (×5): qty 1

## 2020-08-25 MED ORDER — ATROPINE SULFATE 1 MG/10ML IJ SOSY: 1.0000 mg | PREFILLED_SYRINGE | Freq: Once | INTRAMUSCULAR | Status: AC

## 2020-08-25 MED ORDER — FAMOTIDINE 20 MG PO TABS
20.0000 mg | ORAL_TABLET | Freq: Two times a day (BID) | ORAL | Status: DC
  Administered 2020-08-25 – 2020-09-06 (×24): 20 mg
  Filled 2020-08-25 (×24): qty 1

## 2020-08-25 MED ORDER — CETIRIZINE HCL 10 MG PO TABS
10.0000 mg | ORAL_TABLET | Freq: Every day | ORAL | Status: DC
  Administered 2020-08-26 – 2020-09-05 (×11): 10 mg
  Filled 2020-08-25 (×13): qty 1

## 2020-08-25 MED ORDER — PROSOURCE TF PO LIQD
45.0000 mL | Freq: Four times a day (QID) | ORAL | Status: DC
  Administered 2020-08-25 – 2020-09-01 (×29): 45 mL
  Filled 2020-08-25: qty 45

## 2020-08-25 MED ORDER — VITAL 1.5 CAL PO LIQD
1000.0000 mL | ORAL | Status: DC
  Administered 2020-08-25 – 2020-09-01 (×7): 1000 mL

## 2020-08-25 MED ORDER — ATROPINE SULFATE 1 MG/10ML IJ SOSY
PREFILLED_SYRINGE | INTRAMUSCULAR | Status: AC
  Administered 2020-08-25: 1 mg
  Filled 2020-08-25: qty 10

## 2020-08-25 MED ORDER — LORAZEPAM 2 MG/ML IJ SOLN
4.0000 mg | Freq: Once | INTRAMUSCULAR | Status: AC
  Administered 2020-08-25: 4 mg via INTRAVENOUS
  Filled 2020-08-25: qty 2

## 2020-08-25 MED ORDER — METHYLPREDNISOLONE SODIUM SUCC 40 MG IJ SOLR
40.0000 mg | Freq: Four times a day (QID) | INTRAMUSCULAR | Status: DC
  Administered 2020-08-25 – 2020-08-27 (×8): 40 mg via INTRAVENOUS
  Filled 2020-08-25 (×8): qty 1

## 2020-08-25 MED ORDER — QUETIAPINE FUMARATE 100 MG PO TABS
100.0000 mg | ORAL_TABLET | Freq: Two times a day (BID) | ORAL | Status: DC
  Administered 2020-08-25 – 2020-09-03 (×18): 100 mg
  Filled 2020-08-25 (×2): qty 1
  Filled 2020-08-25: qty 4
  Filled 2020-08-25 (×4): qty 1
  Filled 2020-08-25: qty 4
  Filled 2020-08-25 (×10): qty 1

## 2020-08-25 MED ORDER — DEXMEDETOMIDINE HCL IN NACL 400 MCG/100ML IV SOLN
0.4000 ug/kg/h | INTRAVENOUS | Status: DC
  Filled 2020-08-25: qty 100

## 2020-08-25 MED ORDER — NOREPINEPHRINE 4 MG/250ML-% IV SOLN
2.0000 ug/min | INTRAVENOUS | Status: DC
  Administered 2020-08-25: 3 ug/min via INTRAVENOUS
  Administered 2020-08-29: 2 ug/min via INTRAVENOUS
  Filled 2020-08-25 (×2): qty 250

## 2020-08-25 MED ORDER — METOPROLOL TARTRATE 25 MG PO TABS
25.0000 mg | ORAL_TABLET | Freq: Two times a day (BID) | ORAL | Status: DC
  Administered 2020-08-25 – 2020-09-05 (×17): 25 mg
  Filled 2020-08-25 (×21): qty 1

## 2020-08-25 MED ORDER — SODIUM CHLORIDE 0.9 % IV SOLN
250.0000 mL | INTRAVENOUS | Status: DC
  Administered 2020-09-11: 250 mL via INTRAVENOUS

## 2020-08-25 NOTE — Plan of Care (Signed)
Discussed in front of patient plan of care for the evening, pain management and titration of sedation medications to go to MRI with no evidence of learning.  Patient unable to titrated til morning and MRI will be done at 1400 today.  Problem: Health Behavior/Discharge Planning: Goal: Ability to manage health-related needs will improve Outcome: Progressing

## 2020-08-25 NOTE — Progress Notes (Addendum)
Patient agitated throughout shift. PRN's given. MRI completed. Patients oxygen requirements up to 65%. Due to increase in oxygen requirements wake up assessment deferred. Patient tolerating tube feeds, catheter intact with adequate urine output.

## 2020-08-25 NOTE — Progress Notes (Signed)
Nutrition Follow Up Note   DOCUMENTATION CODES:   Obesity unspecified  INTERVENTION:   Increase Vital 1.5 to 17m/hr  Change Pro-Source to 486mQID via tube   Free water flushes 20067m4 hours per MD  Regimen provides 2860kcal/day, 166g/day protein and 2575m39my free water  NUTRITION DIAGNOSIS:   Inadequate oral intake related to inability to eat (pt sedated and ventilated) as evidenced by NPO status. 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 Pt developed worsening respiratory failure 12/23 requiring intubation   Pt remains sedated and ventilated. Pt failed SBTs. OGT in place. Pt tolerating tube feeds at goal rate; will adjust rate today in setting of > 14 days in the ICU as per COVID 19 guidelines. Refeed labs stable. Per chart, pt is weight stable since admit.   Medications reviewed and include: allopurinol, dexamethasone, lovenox, pepcid, insulin, melatonin, protonix, hydromorphone, propofol   Labs reviewed: K 4.5 wnl, BUN 22(H), creat 0.33(L), P 3.4 wnl, Mg 2.1 wnl Hgb 9.7(L), Hct 32.0(L) cbgs- 172, 170 x 24 hrs  Patient is currently intubated on ventilator support MV: 16.4 L/min Temp (24hrs), Avg:97.4 F (36.3 C), Min:96.62 F (35.9 C), Max:98.06 F (36.7 C)  Propofol: 7.07 ml/hr  MAP- >65mm30mUOP- 2100ml 71mt Order:    Diet Order            Diet NPO time specified  Diet effective now                EDUCATION NEEDS:   Not appropriate for education at this time  Skin:  Skin Assessment: Reviewed RN Assessment  Last BM:  1/6- 500ml v78mstomy  Height:   Ht  Readings from Last 1 Encounters:  08/25/20 6' 0.01" (1.829 m)    Weight:   Wt Readings from Last 1 Encounters:  08/25/20 110.2 kg    Ideal Body Weight:  80.9 kg  BMI:  Body mass index is 32.94 kg/m.  Estimated Nutritional Needs:   Kcal:  2700-3000kcal/day  Protein:  161g/day  Fluid:  2.4-2.7L/day  Gordon Vandunk CKoleen Distance, LDN Please refer to [facility] and/or RD on-call/weekend/after hours pager

## 2020-08-25 NOTE — Progress Notes (Signed)
CRITICAL CARE NOTE  30 yo male recent hospitalization for perforated diverticulitis s/p colostomy with reversal and leakage of anastomosis with s/p revision now acutely hypoxemic with COVID19 induced severe ARDS.   INTERVAL EVENTS: 08/11/20-patient continued to decline with worsening respiratory status despite 100%Fio2 on BIPAP and HFNC failure. He stated that he feels he is dying and cannot breathe. I discussed with wife [name] earlier today that patient is critically ill may need ETT, she is agreeable and thankful for care. I specifically discussed and explained intubation and mechanical ventilation to patient and he wishes to proceed.  08/12/20- patient was weaned on FiO2 to 60%. Spoke to wife [name].  08/13/20- Patient weaned to 40%. During SBT today he self extubated and was placed on HFNC. He subsequently became hypoxemic, disoriented aggitated, removed PIVs and colostomy and proceeded to have combative behavior with worsening oxygenation. I have attempted to calm patient and encouraged him to cooperate. After numerous attempts patient continued to be combative with severe aggitation with combative behavior and would not wear oxygen. Patient was placed on sedation and MV. Wife updated.  08/14/20-patient remains critically ill. Weaned from 100%>>55%. Called and updated wife [name] today 12/27-Patient with worsening oxygenation on FiO2 100% peep of 14 08/16/20-Patient with continued high oxygen requirements overnight. Have spoken with surgery and there should be no problem proning patient with this. Was febrile yesterday and thus broad spectrum abx started and cultures attained.  08-17-20-patient down to PEEP 10 FiO2 55%. Patient never required proning 08/18/20:FiO2 weaned to 40%, PEEP 10, plan for diuresis with Diamox today, adding PO Clonazepam to assist weaning sedation 08/19/20: Vent changed to Pressure Control this morning: 50% FiO2, 24, 22/5, Plan for Recruitment maneuvers  and diuresis 08/20/2020:Continued issues with hypercapnia due to the dead space ventilation. Worsening despite pressure control. Switch back to [facility], prone positioning 08/23/19- patient proned today without incident, continue to wean FiO2 , currently on 50% 08/24/19-patient weaned to 40% FiO2, plan to continue proning with full scope of care. I have spoken to [name] (wife of patient today) she would like to be present prior to next extubation attempt to help console patient and decrease his aggitation. 1/5 failed weaning trials, ver agitated , severe hypoxia, Wife at bedside witnessed failed SAT.   CC  follow up respiratory failure  SUBJECTIVE Patient remains critically ill Prognosis is guarded MRI brain pending    BP 116/69   Pulse (!) 106   Temp (!) 97.52 F (36.4 C)   Resp (!) 30   Ht 6' 0.01" (1.829 m)   Wt 110.2 kg   SpO2 92%   BMI 32.94 kg/m    I/O last 3 completed shifts: In: 13270.1 [I.V.:13095.1; NG/GT:175] Out: 3650 [Urine:3050; Stool:600] Total I/O In: -  Out: 250 [Stool:250]  SpO2: 92 % O2 Flow Rate (L/min): 50 L/min FiO2 (%): 50 %  Estimated body mass index is 32.94 kg/m as calculated from the following:   Height as of this encounter: 6' 0.01" (1.829 m).   Weight as of this encounter: 110.2 kg.  SIGNIFICANT EVENTS   REVIEW OF SYSTEMS  PATIENT IS UNABLE TO PROVIDE COMPLETE REVIEW OF SYSTEMS DUE TO SEVERE CRITICAL ILLNESS      COVID-19 DISASTER DECLARATION:   FULL CONTACT PHYSICAL EXAMINATION WAS NOT POSSIBLE DUE TO TREATMENT OF COVID-19  AND CONSERVATION OF PERSONAL PROTECTIVE EQUIPMENT, LIMITED EXAM FINDINGS INCLUDE-   PHYSICAL EXAMINATION:  GENERAL:critically ill appearing, +resp distress NEUROLOGIC: obtunded, GCS<8   Patient assessed or the symptoms described in the history  of present illness.  In the context of the Global COVID-19 pandemic, which necessitated consideration that the patient might be at risk for infection with the  SARS-CoV-2 virus that causes COVID-19, Institutional protocols and algorithms that pertain to the evaluation of patients at risk for COVID-19 are in a state of rapid change based on information released by regulatory bodies including the CDC and federal and state organizations. These policies and algorithms were followed during the patient's care while in hospital.    MEDICATIONS: I have reviewed all medications and confirmed regimen as documented   CULTURE RESULTS   Recent Results (from the past 240 hour(s))  CULTURE, BLOOD (ROUTINE X 2) w Reflex to ID Panel     Status: None   Collection Time: 08/15/20  1:14 PM   Specimen: BLOOD  Result Value Ref Range Status   Specimen Description BLOOD BLOOD LEFT HAND  Final   Special Requests   Final    BOTTLES DRAWN AEROBIC AND ANAEROBIC Blood Culture adequate volume   Culture   Final    NO GROWTH 5 DAYS Performed at [facility], [address] [place], [place] [address]    Report Status 08/20/2020 FINAL  Final  Culture, respiratory (non-expectorated)     Status: None   Collection Time: 08/15/20  2:00 PM   Specimen: Tracheal Aspirate; Respiratory  Result Value Ref Range Status   Specimen Description   Final    TRACHEAL ASPIRATE Performed at [facility], [address] [place], [place] [address]    Special Requests   Final    NONE Performed at [facility], [address] [place], [place] [address]    Gram Stain   Final    FEW WBC PRESENT, PREDOMINANTLY PMN FEW GRAM POSITIVE COCCI IN CLUSTERS IN CHAINS FEW YEAST    Culture   Final    Consistent with normal respiratory flora. Performed at [facility], [place] [address], [place], [place] [address]    Report Status 08/18/2020 FINAL  Final  CULTURE, BLOOD (ROUTINE X 2) w Reflex to ID Panel     Status: None   Collection Time: 08/15/20  2:34 PM   Specimen: BLOOD  Result Value Ref Range Status   Specimen Description BLOOD BLOOD LEFT HAND  Final    Special Requests   Final    BOTTLES DRAWN AEROBIC AND ANAEROBIC Blood Culture adequate volume   Culture   Final    NO GROWTH 5 DAYS Performed at [facility], [address], [place], [place] [address]    Report Status 08/20/2020 FINAL  Final          IMAGING    No results found.   Nutrition Status: Nutrition Problem: Inadequate oral intake Etiology: inability to eat (pt sedated and ventilated) Signs/Symptoms: NPO status       Indwelling Urinary Catheter continued, requirement due to   Reason to continue Indwelling Urinary Catheter strict Intake/Output monitoring for hemodynamic instability   Central Line/ continued, requirement due to  Reason to continue [place] of central venous pressure or other hemodynamic parameters and poor IV access   Ventilator continued, requirement due to severe respiratory failure   Ventilator Sedation RASS 0 to -2      ASSESSMENT AND PLAN SYNOPSIS Acute Hypoxic Respiratory Failurein the setting of COVID-19 Pneumonia with severe ARDS  perforated diverticulitis s/p colostomy with reversal and leakage of anastomosis with s/p revision now acutely hypoxemic with COVID19 induced severe ARD,complicated by delirium  and agitation ?ETOh withdrawals   Severe ACUTE Hypoxic and Hypercapnic Respiratory Failure -continue Full MV support -continue Bronchodilator Therapy -Wean Fio2 and PEEP as tolerated -VAP/VENT bundle implementation  ACUTE DIASTOLIC CARDIAC FAILURE-  -oxygen as needed -Lasix as tolerated  Morbid obesity, possible OSA.   Will certainly impact respiratory mechanics, ventilator weaning Suspect will need to consider additional PEEP  ACUTE KIDNEY INJURY/Renal Failure -continue Foley Catheter-assess need -Avoid nephrotoxic agents -Follow urine output, BMP -Ensure adequate renal perfusion, optimize oxygenation -Renal dose medications     NEUROLOGY Acute toxic metabolic encephalopathy, need for  sedation Goal RASS -2 to -3 MRI PENDING  CARDIAC ICU monitoring  ID -continue IV abx as prescibed -follow up cultures  GI GI PROPHYLAXIS as indicated   DIET-->TF's as tolerated Constipation protocol as indicated  ENDO - will use ICU hypoglycemic\Hyperglycemia protocol if indicated     ELECTROLYTES -follow labs as needed -replace as needed -pharmacy consultation and following   DVT/GI PRX ordered and assessed TRANSFUSIONS AS NEEDED MONITOR FSBS I Assessed the need for Labs I Assessed the need for Foley I Assessed the need for Central Venous Line Family Discussion when available I Assessed the need for Mobilization I made an Assessment of medications to be adjusted accordingly Safety Risk assessment completed   CASE DISCUSSED IN MULTIDISCIPLINARY ROUNDS WITH ICU TEAM  Critical Care Time devoted to patient care services described in this note is 45  minutes.   Overall, patient is critically ill, prognosis is guarded.  Patient with Multiorgan failure and at high risk for cardiac arrest and death.    Lucie Leather, M.D.  [facility]  Medical Director [facility] Medical Director [facility] Cardio-Pulmonary Department

## 2020-08-26 ENCOUNTER — Inpatient Hospital Stay: Payer: No Typology Code available for payment source

## 2020-08-26 ENCOUNTER — Inpatient Hospital Stay: Payer: Self-pay

## 2020-08-26 DIAGNOSIS — J9601 Acute respiratory failure with hypoxia: Secondary | ICD-10-CM | POA: Diagnosis not present

## 2020-08-26 DIAGNOSIS — A419 Sepsis, unspecified organism: Secondary | ICD-10-CM | POA: Diagnosis not present

## 2020-08-26 DIAGNOSIS — U071 COVID-19: Secondary | ICD-10-CM | POA: Diagnosis not present

## 2020-08-26 DIAGNOSIS — R6520 Severe sepsis without septic shock: Secondary | ICD-10-CM | POA: Diagnosis not present

## 2020-08-26 LAB — GLUCOSE, CAPILLARY
Glucose-Capillary: 239 mg/dL — ABNORMAL HIGH (ref 70–99)
Glucose-Capillary: 218 mg/dL — ABNORMAL HIGH (ref 70–99)
Glucose-Capillary: 202 mg/dL — ABNORMAL HIGH (ref 70–99)
Glucose-Capillary: 216 mg/dL — ABNORMAL HIGH (ref 70–99)
Glucose-Capillary: 201 mg/dL — ABNORMAL HIGH (ref 70–99)
Glucose-Capillary: 168 mg/dL — ABNORMAL HIGH (ref 70–99)

## 2020-08-26 LAB — CBC WITH DIFFERENTIAL/PLATELET
Abs Immature Granulocytes: 0 10*3/uL (ref 0.00–0.07)
Band Neutrophils: 0 %
Basophils Absolute: 0 10*3/uL (ref 0.0–0.1)
Basophils Relative: 0 %
Blasts: 0 %
Eosinophils Absolute: 0 10*3/uL (ref 0.0–0.5)
Eosinophils Relative: 0 %
HCT: 37 % — ABNORMAL LOW (ref 39.0–52.0)
Hemoglobin: 11.3 g/dL — ABNORMAL LOW (ref 13.0–17.0)
Lymphocytes Relative: 4 %
Lymphs Abs: 0.7 10*3/uL (ref 0.7–4.0)
MCH: 28.5 pg (ref 26.0–34.0)
MCHC: 30.5 g/dL (ref 30.0–36.0)
MCV: 93.2 fL (ref 80.0–100.0)
Metamyelocytes Relative: 0 %
Monocytes Absolute: 0.5 10*3/uL (ref 0.1–1.0)
Monocytes Relative: 3 %
Myelocytes: 0 %
Neutro Abs: 15.7 10*3/uL — ABNORMAL HIGH (ref 1.7–7.7)
Neutrophils Relative %: 93 %
Other: 0 %
Platelets: 274 10*3/uL (ref 150–400)
Promyelocytes Relative: 0 %
RBC: 3.97 MIL/uL — ABNORMAL LOW (ref 4.22–5.81)
RDW: 16.2 % — ABNORMAL HIGH (ref 11.5–15.5)
WBC: 16.9 10*3/uL — ABNORMAL HIGH (ref 4.0–10.5)
nRBC: 0 /100 WBC
nRBC: 0.4 % — ABNORMAL HIGH (ref 0.0–0.2)

## 2020-08-26 LAB — BASIC METABOLIC PANEL
Anion gap: 10 (ref 5–15)
BUN: 21 mg/dL — ABNORMAL HIGH (ref 6–20)
CO2: 32 mmol/L (ref 22–32)
Calcium: 9.1 mg/dL (ref 8.9–10.3)
Chloride: 101 mmol/L (ref 98–111)
Creatinine, Ser: 0.35 mg/dL — ABNORMAL LOW (ref 0.61–1.24)
GFR, Estimated: >60 mL/min (ref >60)
Glucose, Bld: 193 mg/dL — ABNORMAL HIGH (ref 70–99)
Potassium: 4.7 mmol/L (ref 3.5–5.1)
Sodium: 143 mmol/L (ref 135–145)

## 2020-08-26 LAB — C-REACTIVE PROTEIN: CRP: 0.9 mg/dL (ref <1.0)

## 2020-08-26 LAB — FERRITIN: Ferritin: 488 ng/mL — ABNORMAL HIGH (ref 24–336)

## 2020-08-26 LAB — TROPONIN I (HIGH SENSITIVITY): Troponin I (High Sensitivity): 14 ng/L (ref <18)

## 2020-08-26 LAB — MAGNESIUM: Magnesium: 2.3 mg/dL (ref 1.7–2.4)

## 2020-08-26 LAB — D-DIMER, QUANTITATIVE: D-Dimer, Quant: 2.66 ug/mL-FEU — ABNORMAL HIGH (ref 0.00–0.50)

## 2020-08-26 LAB — PHOSPHORUS: Phosphorus: 3.4 mg/dL (ref 2.5–4.6)

## 2020-08-26 LAB — PROCALCITONIN: Procalcitonin: 0.12 ng/mL

## 2020-08-26 IMAGING — DX DG CHEST 1V PORT
1 series · 1 of 1 positions shown · non-contrast
Comparison: [DATE] and older exams.

CLINICAL DATA: Intubated patient.  Respiratory distress.

EXAM:
PORTABLE CHEST 1 VIEW

[chest ap]
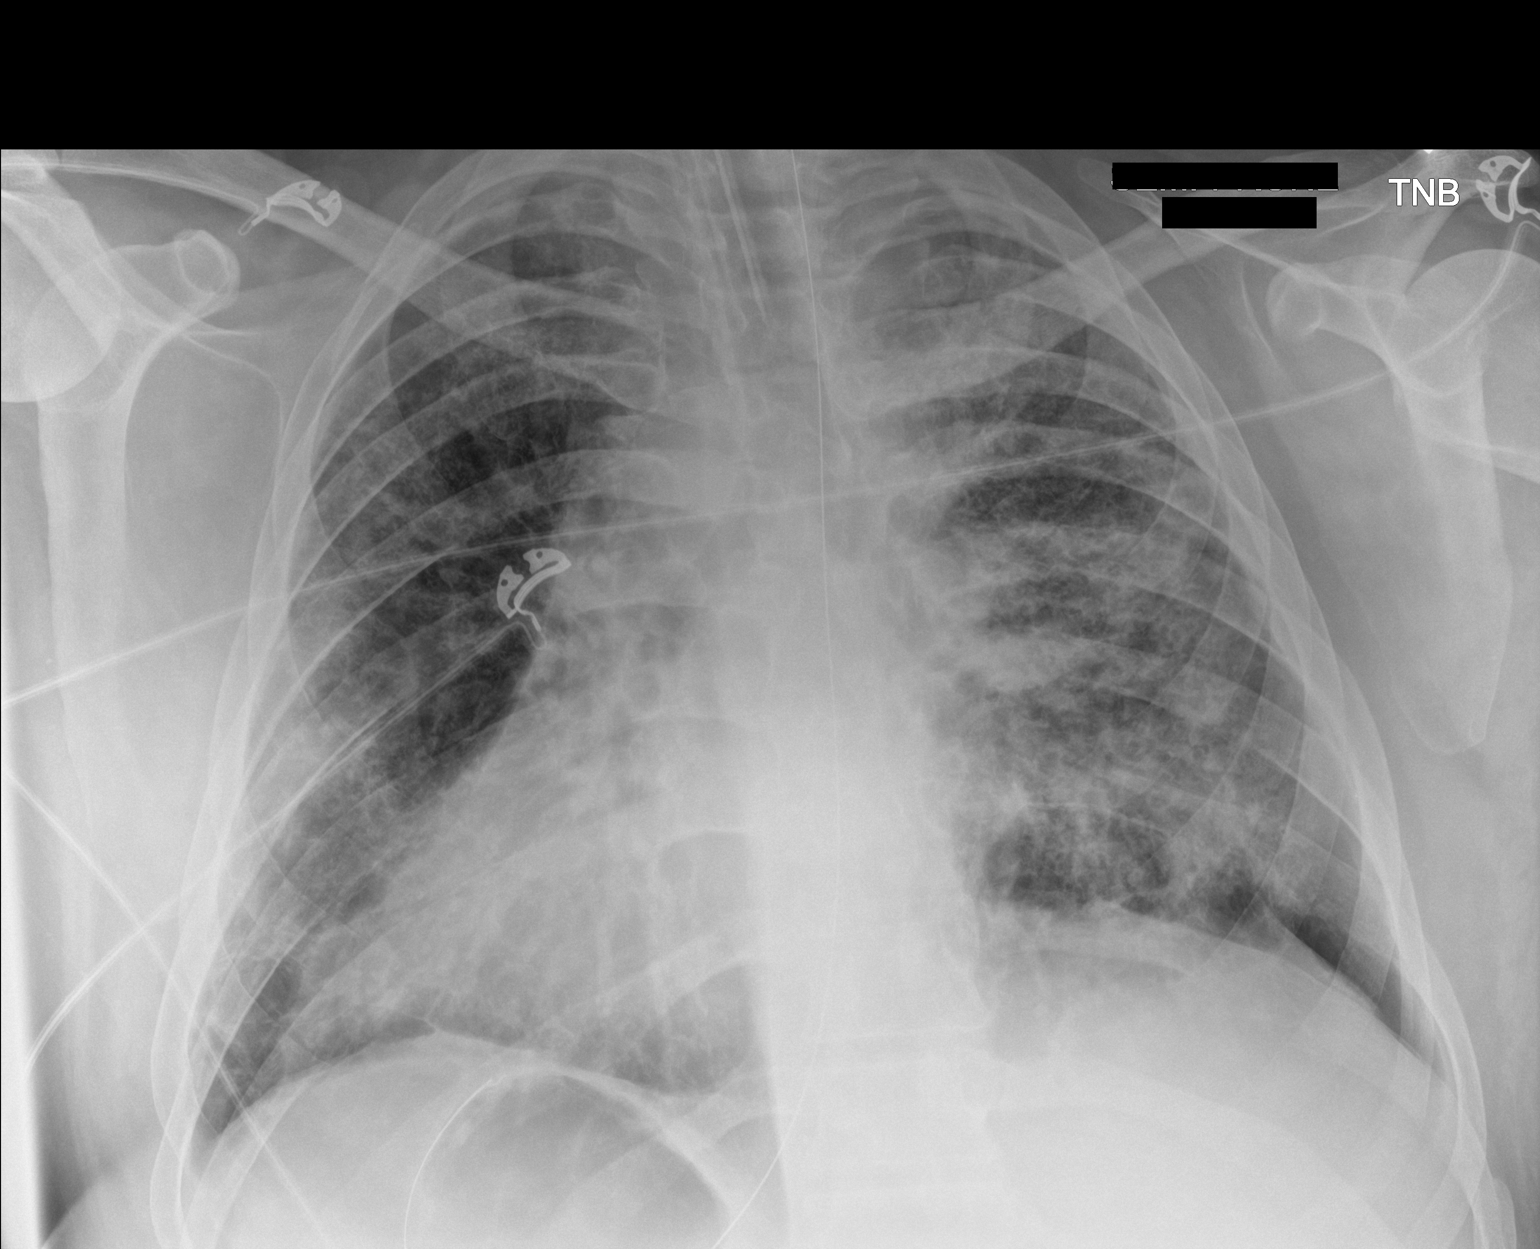

[1 of 1 positions shown; findings below may reference images not displayed]

FINDINGS: Endotracheal tube tip projects 4 cm above the carina.

Nasal/orogastric tube passes well below the diaphragm into the
stomach.

Lungs demonstrate patchy bilateral airspace opacities without
significant change from the previous day's study. No new lung
abnormalities. No convincing pleural effusion and no pneumothorax.
IMPRESSION: 1. Endotracheal tube tip projects 4 cm above the carina.
Well-positioned nasal/orogastric tube.
2. No change in lung aeration with bilateral airspace lung opacities
consistent with multifocal infection.

## 2020-08-26 MED ORDER — INSULIN ASPART 100 UNIT/ML ~~LOC~~ SOLN
14.0000 [IU] | SUBCUTANEOUS | Status: DC
  Administered 2020-08-26 – 2020-09-02 (×39): 14 [IU] via SUBCUTANEOUS
  Filled 2020-08-26 (×40): qty 1

## 2020-08-26 MED ORDER — INSULIN DETEMIR 100 UNIT/ML ~~LOC~~ SOLN
40.0000 [IU] | Freq: Two times a day (BID) | SUBCUTANEOUS | Status: DC
  Administered 2020-08-26 – 2020-09-02 (×14): 40 [IU] via SUBCUTANEOUS
  Filled 2020-08-26 (×17): qty 0.4

## 2020-08-26 NOTE — Progress Notes (Addendum)
Spoke with Primary RN re: PICC order, it will not be done today. Patient is in prone position at this time and will be unprone at 2200 tonight. Primary RN will call me back if anything change. Will follow up.   Canon with RN, PICC will be done tomorrow 1/8. Patient will still be unprone at 2200 tonight.

## 2020-08-26 NOTE — Progress Notes (Signed)
CRITICAL CARE NOTE 30 yo male recent hospitalization for perforated diverticulitis s/p colostomy with reversal and leakage of anastomosis with s/p revision now acutely hypoxemic with COVID19 induced severe ARDS.   INTERVAL EVENTS: 08/11/20-patient continued to decline with worsening respiratory status despite 100%Fio2 on BIPAP and HFNC failure. He stated that he feels he is dying and cannot breathe. I discussed with wife [name] earlier today that patient is critically ill may need ETT, she is agreeable and thankful for care. I specifically discussed and explained intubation and mechanical ventilation to patient and he wishes to proceed.  08/12/20- patient was weaned on FiO2 to 60%. Spoke to wife [name].  08/13/20- Patient weaned to 40%. During SBT today he self extubated and was placed on HFNC. He subsequently became hypoxemic, disoriented aggitated, removed PIVs and colostomy and proceeded to have combative behavior with worsening oxygenation. I have attempted to calm patient and encouraged him to cooperate. After numerous attempts patient continued to be combative with severe aggitation with combative behavior and would not wear oxygen. Patient was placed on sedation and MV. Wife updated.  08/14/20-patient remains critically ill. Weaned from 100%>>55%. Called and updated wife [name] today 12/27-Patient with worsening oxygenation on FiO2 100% peep of 14 08/16/20-Patient with continued high oxygen requirements overnight. Have spoken with surgery and there should be no problem proning patient with this. Was febrile yesterday and thus broad spectrum abx started and cultures attained.  08-17-20-patient down to PEEP 10 FiO2 55%. Patient never required proning 08/18/20:FiO2 weaned to 40%, PEEP 10, plan for diuresis with Diamox today, adding PO Clonazepam to assist weaning sedation 08/19/20: Vent changed to Pressure Control this morning: 50% FiO2, 24, 22/5, Plan for Recruitment maneuvers and  diuresis 08/20/2020:Continued issues with hypercapnia due to the dead space ventilation. Worsening despite pressure control. Switch back to [facility], prone positioning 08/23/19- patient proned today without incident, continue to wean FiO2 , currently on 50% 08/24/19-patient weaned to 40% FiO2, plan to continue proning with full scope of care. I have spoken to [name] (wife of patient today) she would like to be present prior to next extubation attempt to help console patient and decrease his aggitation. 1/5 failed weaning trials, ver agitated , severe hypoxia, Wife at bedside witnessed failed SAT. 1/6 severe resp failure, hypoxia 1/7 proning for 16 hrs, severe ARDS    CC  follow up respiratory failure  SUBJECTIVE Patient remains critically ill Prognosis is guarded proned position Severe ARDS Cardiac arrythmias noted    BP 127/70   Pulse (!) 106   Temp 98.7 F (37.1 C) (Oral)   Resp (!) 30   Ht 6' 0.01" (1.829 m)   Wt 108.6 kg   SpO2 91%   BMI 32.46 kg/m    I/O last 3 completed shifts: In: 2566.3 [I.V.:2391.3; NG/GT:175] Out: [id number] [Urine:3830; Stool:625] No intake/output data recorded.  SpO2: 91 % O2 Flow Rate (L/min): 50 L/min FiO2 (%): 65 %  Estimated body mass index is 32.46 kg/m as calculated from the following:   Height as of this encounter: 6' 0.01" (1.829 m).   Weight as of this encounter: 108.6 kg.  SIGNIFICANT EVENTS   REVIEW OF SYSTEMS  PATIENT IS UNABLE TO PROVIDE COMPLETE REVIEW OF SYSTEMS DUE TO SEVERE CRITICAL ILLNESS      COVID-19 DISASTER DECLARATION:   FULL CONTACT PHYSICAL EXAMINATION WAS NOT POSSIBLE DUE TO TREATMENT OF COVID-19  AND CONSERVATION OF PERSONAL PROTECTIVE EQUIPMENT, LIMITED EXAM FINDINGS INCLUDE-   PHYSICAL EXAMINATION:  GENERAL:critically ill appearing, +resp distress NEUROLOGIC: obtunded,  GCS<8   Patient assessed or the symptoms described in the history of present illness.  In the context of the Global COVID-19  pandemic, which necessitated consideration that the patient might be at risk for infection with the SARS-CoV-2 virus that causes COVID-19, Institutional protocols and algorithms that pertain to the evaluation of patients at risk for COVID-19 are in a state of rapid change based on information released by regulatory bodies including the CDC and federal and state organizations. These policies and algorithms were followed during the patient's care while in hospital.    MEDICATIONS: I have reviewed all medications and confirmed regimen as documented   CULTURE RESULTS   No results found for this or any previous visit (from the past 240 hour(s)).        IMAGING    MR BRAIN WO CONTRAST  Result Date: 08/25/2020 CLINICAL DATA:  Anoxic brain injury.  COVID with ARDS. EXAM: MRI HEAD WITHOUT CONTRAST TECHNIQUE: Multiplanar, multiecho pulse sequences of the brain and surrounding structures were obtained without intravenous contrast. COMPARISON:  None. FINDINGS: Brain: No acute infarction, hemorrhage, hydrocephalus, extra-axial collection or mass lesion. No sign of anoxic injury. No chronic white matter disease or atrophy. Vascular: Normal flow voids. Skull and upper cervical spine: Normal marrow signal Sinuses/Orbits: Diffuse sinus opacification by mucosal thickening with left maxillary fluid level. Confluent opacification of the bilateral middle ear and mastoids. There is nasopharyngeal fluid in the setting of intubation. IMPRESSION: 1. Normal MRI of the brain. 2. Diffuse sinusitis and otomastoid opacification in the setting of intubation. Electronically Signed   By: Monte Fantasia M.D.   On: 08/25/2020 11:50   DG Chest Port 1 View  Result Date: 08/25/2020 CLINICAL DATA:  COVID-19 EXAM: PORTABLE CHEST 1 VIEW COMPARISON:  08/22/2020 FINDINGS: Endotracheal tube and partially imaged enteric tube are unchanged. Patient is rotated. Persistent bilateral pulmonary opacities, right greater than left. No  significant pleural effusion. No pneumothorax. IMPRESSION: Persistent bilateral pulmonary opacities, right greater than left. Aeration is stable to minimally improved. Electronically Signed   By: Macy Mis M.D.   On: 08/25/2020 15:38     Nutrition Status: Nutrition Problem: Inadequate oral intake Etiology: inability to eat (pt sedated and ventilated) Signs/Symptoms: NPO status       Indwelling Urinary Catheter continued, requirement due to   Reason to continue Indwelling Urinary Catheter strict Intake/Output monitoring for hemodynamic instability   Central Line/ continued, requirement due to  Reason to continue [place] of central venous pressure or other hemodynamic parameters and poor IV access   Ventilator continued, requirement due to severe respiratory failure   Ventilator Sedation RASS 0 to -2      ASSESSMENT AND PLAN SYNOPSIS  Acute Hypoxic Respiratory Failurein the setting of COVID-19 Pneumonia with severe ARDS perforated diverticulitis s/p colostomy with reversal and leakage of anastomosis with s/p revision now acutely hypoxemic with COVID19 induced severe ARD,complicated by delirium and agitation ?ETOh withdrawals    Acute hypoxemic respiratory failure due to COVID-19 pneumonia / ARDS Mechanical ventilation via ARDS protocol, target PRVC 6 cc/kg Wean PEEP and FiO2 as able Goal plateau pressure less than 30, driving pressure less than 15 Paralytics if necessary for vent synchrony, gas exchange Cycle prone positioning if necessary for oxygenation Deep sedation per PAD protocol, goal RASS -4,  Diuresis as blood pressure and renal function can tolerate, goal CVP 5-8.   diuresis as tolerated based on Kidney function VAP prevention order set S/p Remdesivir  IV STEROIDS     Severe  ACUTE Hypoxic and Hypercapnic Respiratory Failure -continue Full MV support -continue Bronchodilator Therapy -Wean Fio2 and PEEP as tolerated -VAP/VENT bundle  implementation  ACUTE DIASTOLIC CARDIAC FAILURE-  -oxygen as needed -Lasix as tolerated   Morbid obesity, possible OSA.   Will certainly impact respiratory mechanics, ventilator weaning Suspect will need to consider additional PEEP  ACUTE KIDNEY INJURY/Renal Failure -continue Foley Catheter-assess need -Avoid nephrotoxic agents -Follow urine output, BMP -Ensure adequate renal perfusion, optimize oxygenation -Renal dose medications    NEUROLOGY Acute toxic metabolic encephalopathy, need for sedation Goal RASS -2 to -3 MRI brain NEG   CARDIAC ICU monitoring  ID -continue IV abx as prescibed -follow up cultures  GI GI PROPHYLAXIS as indicated   DIET-->TF's as tolerated Constipation protocol as indicated  ENDO - will use ICU hypoglycemic\Hyperglycemia protocol if indicated     ELECTROLYTES -follow labs as needed -replace as needed -pharmacy consultation and following   DVT/GI PRX ordered and assessed TRANSFUSIONS AS NEEDED MONITOR FSBS I Assessed the need for Labs I Assessed the need for Foley I Assessed the need for Central Venous Line Family Discussion when available I Assessed the need for Mobilization I made an Assessment of medications to be adjusted accordingly Safety Risk assessment completed   CASE DISCUSSED IN MULTIDISCIPLINARY ROUNDS WITH ICU TEAM  Critical Care Time devoted to patient care services described in this note is 55 minutes.   Overall, patient is critically ill, prognosis is guarded.  Patient with Multiorgan failure and at high risk for cardiac arrest and death.    Corrin Parker, M.D.  [facility]  Medical Director [place] Director [facility] Cardio-Pulmonary Department

## 2020-08-26 NOTE — Progress Notes (Signed)
Pt responds to pain. Proned oxygen saturations have slowly improved throughout shift. Tolerating tube feeds. ETT and OG intact. PICC team will come to place PICC when pt is unproned. Pt oxygen saturations did not tolerate head turned to right when proned. Wife came to bedside and updated on pt status.

## 2020-08-26 NOTE — Progress Notes (Signed)
Inpatient Diabetes Program Recommendations  AACE/ADA: New Consensus Statement on Inpatient Glycemic Control (2015)  Target Ranges:  Prepandial:   less than 140 mg/dL      Peak postprandial:   less than 180 mg/dL (1-2 hours)      Critically ill patients:  140 - 180 mg/dL   Lab Results  Component Value Date   GLUCAP 202 (H) 08/26/2020   HGBA1C 5.7 (H) 08/10/2020    Review of Glycemic Control Results for MERLE, CIRELLI (MRN [id number]) as of 08/26/2020 13:52  Ref. Range 08/25/2020 19:40 08/25/2020 23:19 08/26/2020 03:57 08/26/2020 07:49 08/26/2020 11:20  Glucose-Capillary Latest Ref Range: 70 - 99 mg/dL 138 (H) 183 (H) 239 (H) 218 (H) 202 (H)  Diabetes history: None Current orders for Inpatient glycemic control:  Levemir 35 units bid, Novolog 12 units q 4 hours, Novolog 2-4-6 q 4 hours Vital 75 cc/ hr Solumedrol 40 mg IV q 6 hours Inpatient Diabetes Program Recommendations:    Consider increasing Levemir to 40 units bid and increase Novolog tube feed coverage to 14 units q 4 hours.   Thanks,  Adah Perl, RN, BC-ADM Inpatient Diabetes Coordinator Pager [phone] (8a-5p)

## 2020-08-26 NOTE — Progress Notes (Signed)
Pt ETT secured with cloth tape, 26cm at the right side of mouth. Pt proned on left side of the face with donut support underneath head. Pt tol proning well. Sx catheter passed with ease. Pt PIP 40, VT 561, RR 30, SPO2 94%.

## 2020-08-27 ENCOUNTER — Inpatient Hospital Stay: Payer: No Typology Code available for payment source

## 2020-08-27 DIAGNOSIS — I469 Cardiac arrest, cause unspecified: Secondary | ICD-10-CM | POA: Diagnosis not present

## 2020-08-27 DIAGNOSIS — U071 COVID-19: Secondary | ICD-10-CM | POA: Diagnosis not present

## 2020-08-27 DIAGNOSIS — J9601 Acute respiratory failure with hypoxia: Secondary | ICD-10-CM | POA: Diagnosis not present

## 2020-08-27 LAB — GLUCOSE, CAPILLARY
Glucose-Capillary: 110 mg/dL — ABNORMAL HIGH (ref 70–99)
Glucose-Capillary: 115 mg/dL — ABNORMAL HIGH (ref 70–99)
Glucose-Capillary: 161 mg/dL — ABNORMAL HIGH (ref 70–99)
Glucose-Capillary: 166 mg/dL — ABNORMAL HIGH (ref 70–99)
Glucose-Capillary: 179 mg/dL — ABNORMAL HIGH (ref 70–99)
Glucose-Capillary: 186 mg/dL — ABNORMAL HIGH (ref 70–99)
Glucose-Capillary: 203 mg/dL — ABNORMAL HIGH (ref 70–99)

## 2020-08-27 LAB — C-REACTIVE PROTEIN: CRP: 0.9 mg/dL (ref <1.0)

## 2020-08-27 LAB — CBC WITH DIFFERENTIAL/PLATELET
Abs Immature Granulocytes: 0.39 10*3/uL — ABNORMAL HIGH (ref 0.00–0.07)
Basophils Absolute: 0 10*3/uL (ref 0.0–0.1)
Basophils Relative: 0 %
Eosinophils Absolute: 0 10*3/uL (ref 0.0–0.5)
Eosinophils Relative: 0 %
HCT: 34.3 % — ABNORMAL LOW (ref 39.0–52.0)
Hemoglobin: 10.9 g/dL — ABNORMAL LOW (ref 13.0–17.0)
Immature Granulocytes: 3 %
Lymphocytes Relative: 4 %
Lymphs Abs: 0.6 10*3/uL — ABNORMAL LOW (ref 0.7–4.0)
MCH: 29.4 pg (ref 26.0–34.0)
MCHC: 31.8 g/dL (ref 30.0–36.0)
MCV: 92.5 fL (ref 80.0–100.0)
Monocytes Absolute: 0.5 10*3/uL (ref 0.1–1.0)
Monocytes Relative: 3 %
Neutro Abs: 13.3 10*3/uL — ABNORMAL HIGH (ref 1.7–7.7)
Neutrophils Relative %: 90 %
Platelets: 213 10*3/uL (ref 150–400)
RBC: 3.71 MIL/uL — ABNORMAL LOW (ref 4.22–5.81)
RDW: 16 % — ABNORMAL HIGH (ref 11.5–15.5)
WBC: 14.8 10*3/uL — ABNORMAL HIGH (ref 4.0–10.5)
nRBC: 0.3 % — ABNORMAL HIGH (ref 0.0–0.2)

## 2020-08-27 LAB — FERRITIN: Ferritin: 445 ng/mL — ABNORMAL HIGH (ref 24–336)

## 2020-08-27 LAB — BASIC METABOLIC PANEL
Anion gap: 9 (ref 5–15)
BUN: 28 mg/dL — ABNORMAL HIGH (ref 6–20)
CO2: 32 mmol/L (ref 22–32)
Calcium: 9.4 mg/dL (ref 8.9–10.3)
Chloride: 100 mmol/L (ref 98–111)
Creatinine, Ser: 0.32 mg/dL — ABNORMAL LOW (ref 0.61–1.24)
GFR, Estimated: >60 mL/min (ref >60)
Glucose, Bld: 149 mg/dL — ABNORMAL HIGH (ref 70–99)
Potassium: 4.6 mmol/L (ref 3.5–5.1)
Sodium: 141 mmol/L (ref 135–145)

## 2020-08-27 LAB — MAGNESIUM
Magnesium: 2.5 mg/dL — ABNORMAL HIGH (ref 1.7–2.4)
Magnesium: 2.6 mg/dL — ABNORMAL HIGH (ref 1.7–2.4)

## 2020-08-27 LAB — CBC
HCT: 39.9 % (ref 39.0–52.0)
Hemoglobin: 12.2 g/dL — ABNORMAL LOW (ref 13.0–17.0)
MCH: 28.5 pg (ref 26.0–34.0)
MCHC: 30.6 g/dL (ref 30.0–36.0)
MCV: 93.2 fL (ref 80.0–100.0)
Platelets: 203 10*3/uL (ref 150–400)
RBC: 4.28 MIL/uL (ref 4.22–5.81)
RDW: 16.5 % — ABNORMAL HIGH (ref 11.5–15.5)
WBC: 19.1 10*3/uL — ABNORMAL HIGH (ref 4.0–10.5)
nRBC: 0.4 % — ABNORMAL HIGH (ref 0.0–0.2)

## 2020-08-27 LAB — COMPREHENSIVE METABOLIC PANEL
ALT: 37 U/L (ref 0–44)
AST: 25 U/L (ref 15–41)
Albumin: 3.2 g/dL — ABNORMAL LOW (ref 3.5–5.0)
Alkaline Phosphatase: 63 U/L (ref 38–126)
Anion gap: 13 (ref 5–15)
BUN: 28 mg/dL — ABNORMAL HIGH (ref 6–20)
CO2: 32 mmol/L (ref 22–32)
Calcium: 9.5 mg/dL (ref 8.9–10.3)
Chloride: 99 mmol/L (ref 98–111)
Creatinine, Ser: 0.47 mg/dL — ABNORMAL LOW (ref 0.61–1.24)
GFR, Estimated: >60 mL/min (ref >60)
Glucose, Bld: 158 mg/dL — ABNORMAL HIGH (ref 70–99)
Potassium: 3.9 mmol/L (ref 3.5–5.1)
Sodium: 144 mmol/L (ref 135–145)
Total Bilirubin: 0.7 mg/dL (ref 0.3–1.2)
Total Protein: 6.7 g/dL (ref 6.5–8.1)

## 2020-08-27 LAB — PROTIME-INR
INR: 1 (ref 0.8–1.2)
Prothrombin Time: 12.9 seconds (ref 11.4–15.2)

## 2020-08-27 LAB — TROPONIN I (HIGH SENSITIVITY)
Troponin I (High Sensitivity): 17 ng/L (ref <18)
Troponin I (High Sensitivity): 29 ng/L — ABNORMAL HIGH (ref <18)

## 2020-08-27 LAB — PHOSPHORUS: Phosphorus: 3.5 mg/dL (ref 2.5–4.6)

## 2020-08-27 LAB — PROCALCITONIN: Procalcitonin: <0.1 ng/mL

## 2020-08-27 LAB — D-DIMER, QUANTITATIVE: D-Dimer, Quant: 1.69 ug/mL-FEU — ABNORMAL HIGH (ref 0.00–0.50)

## 2020-08-27 LAB — TRIGLYCERIDES: Triglycerides: 322 mg/dL — ABNORMAL HIGH (ref <150)

## 2020-08-27 IMAGING — DX DG CHEST 1V PORT
1 series · 1 of 1 positions shown · non-contrast
Comparison: [DATE]

CLINICAL DATA: [TK].  Cardiac arrest.

EXAM:
PORTABLE CHEST 1 VIEW

[chest ap]
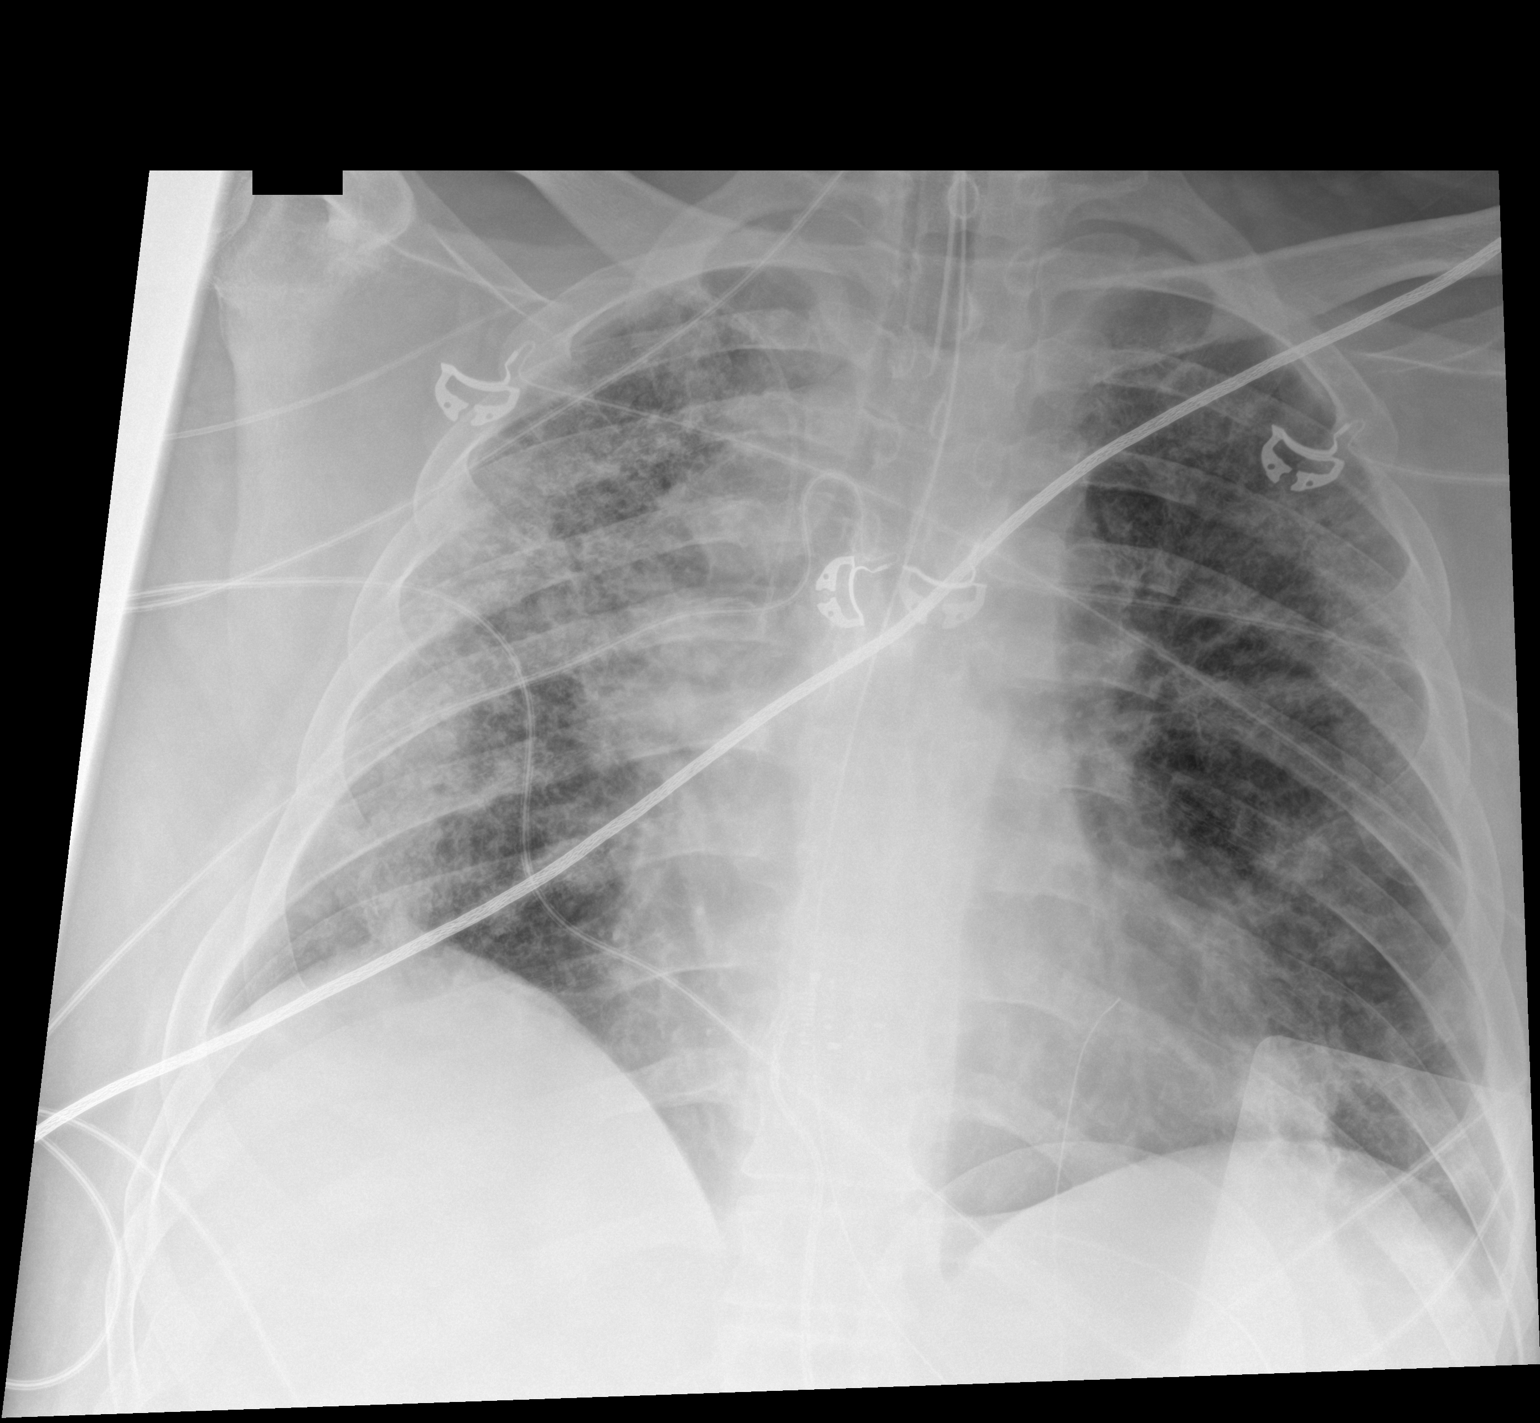

[1 of 1 positions shown; findings below may reference images not displayed]

FINDINGS: The ETT is in good position. The NG tube terminates below today's
film. Bilateral pulmonary infiltrates remain, right greater than
left, increased in the interval. No pneumothorax. A new right PICC
line terminates in the central SVC. No other interval changes.
IMPRESSION: 1. Support apparatus as above.
2. Bilateral pulmonary infiltrates, right greater than left,
increased in the interval.

## 2020-08-27 IMAGING — DX DG CHEST 1V PORT
1 series · 1 of 1 positions shown · non-contrast
Comparison: Radiograph yesterday.

CLINICAL DATA: Acute respiratory failure.

EXAM:
PORTABLE CHEST 1 VIEW

[chest ap]
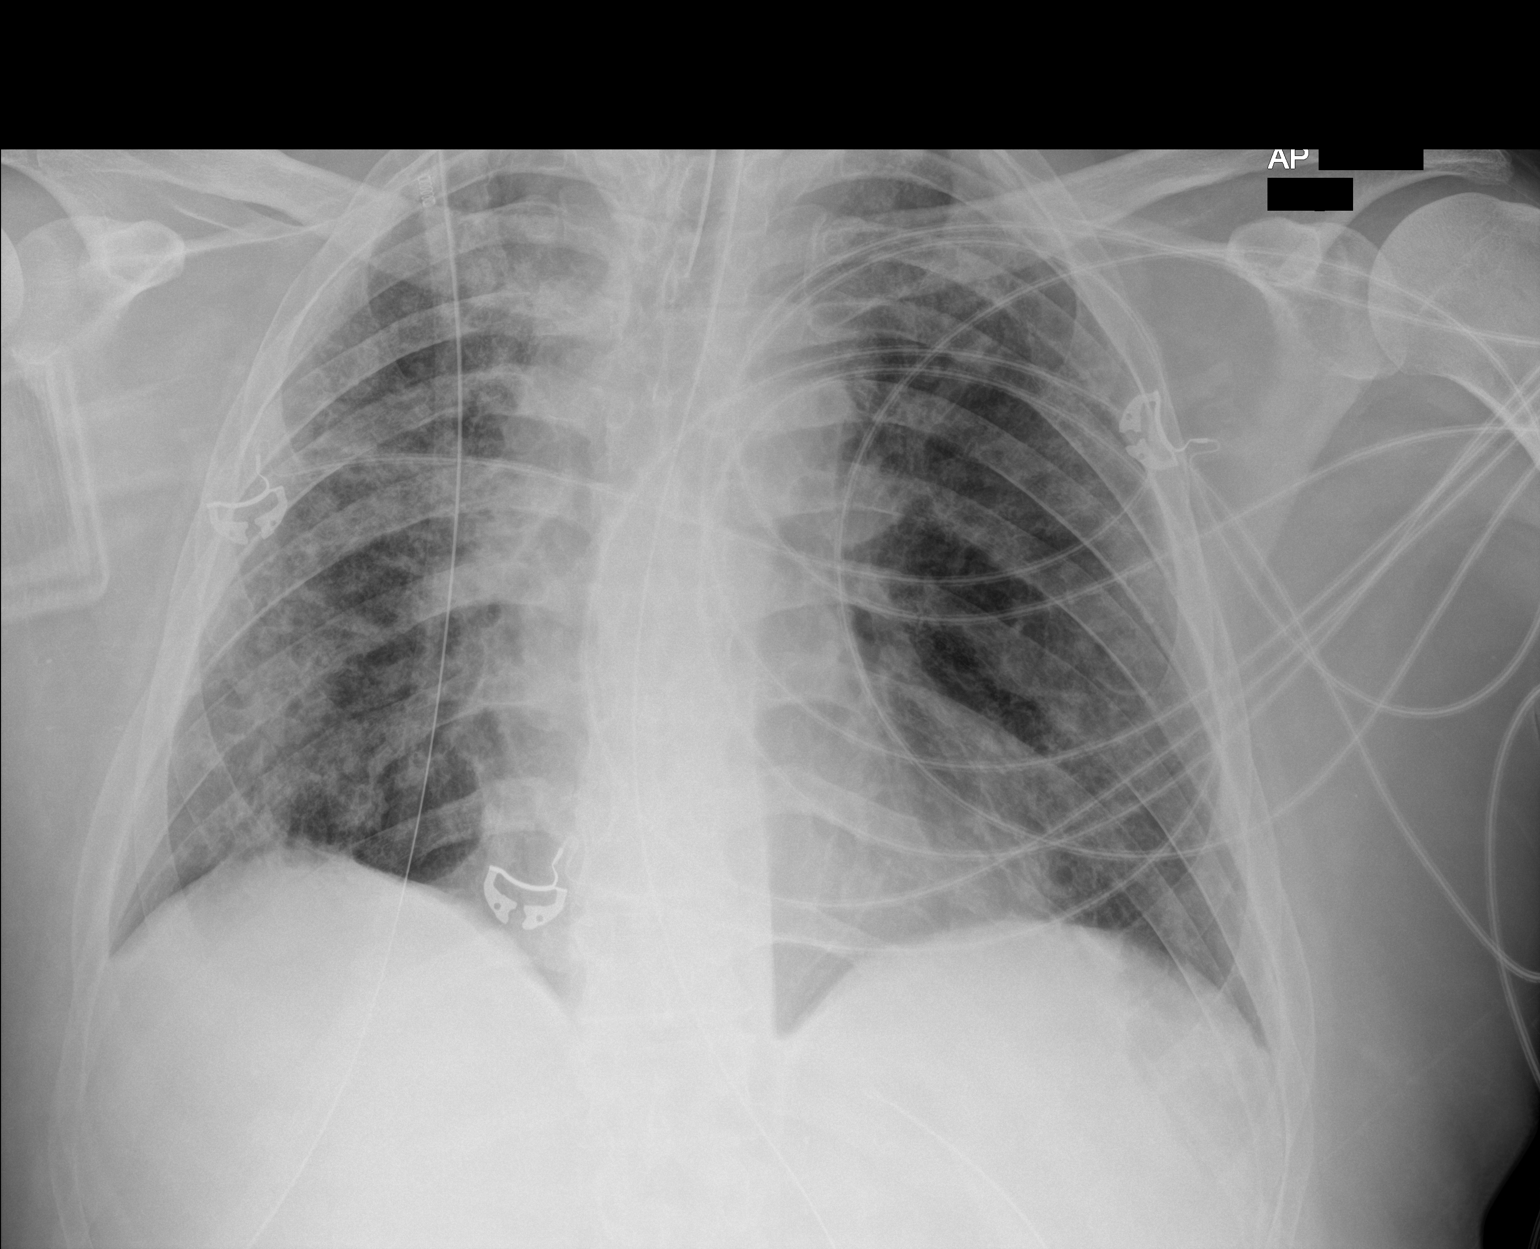

[1 of 1 positions shown; findings below may reference images not displayed]

FINDINGS: Endotracheal tube tip 4.9 cm from the carina. Enteric tube tip and
side-port below the diaphragm in the stomach. Slight improvement in
patchy bilateral lung opacities, right greater than left. Stable
heart size and mediastinal contours. No pneumomediastinum or
pneumothorax. No significant pleural fluid.
IMPRESSION: 1. Slight improvement in patchy bilateral lung opacities since
yesterday, right greater than left.
2. Stable support apparatus.

## 2020-08-27 MED ORDER — ATROPINE SULFATE 1 MG/10ML IJ SOSY
PREFILLED_SYRINGE | INTRAMUSCULAR | Status: AC
  Filled 2020-08-27: qty 10

## 2020-08-27 MED ORDER — ENOXAPARIN SODIUM 60 MG/0.6ML ~~LOC~~ SOLN
0.5000 mg/kg | Freq: Every day | SUBCUTANEOUS | Status: DC
  Administered 2020-08-27 – 2020-08-30 (×3): 55 mg via SUBCUTANEOUS
  Filled 2020-08-27 (×6): qty 0.6

## 2020-08-27 MED ORDER — METHYLPREDNISOLONE SODIUM SUCC 40 MG IJ SOLR
40.0000 mg | Freq: Two times a day (BID) | INTRAMUSCULAR | Status: DC
  Administered 2020-08-27 – 2020-08-31 (×8): 40 mg via INTRAVENOUS
  Filled 2020-08-27 (×8): qty 1

## 2020-08-27 MED ORDER — SODIUM CHLORIDE 0.9% FLUSH
10.0000 mL | Freq: Two times a day (BID) | INTRAVENOUS | Status: DC
  Administered 2020-08-27 – 2020-08-29 (×6): 10 mL
  Administered 2020-08-30: 20 mL
  Administered 2020-08-30 – 2020-09-03 (×9): 10 mL
  Administered 2020-09-04: 40 mL
  Administered 2020-09-04 – 2020-09-05 (×2): 10 mL
  Administered 2020-09-05: 40 mL
  Administered 2020-09-06 – 2020-09-07 (×4): 10 mL

## 2020-08-27 MED ORDER — FUROSEMIDE 10 MG/ML IJ SOLN
40.0000 mg | Freq: Two times a day (BID) | INTRAMUSCULAR | Status: DC
  Administered 2020-08-27 – 2020-09-03 (×14): 40 mg via INTRAVENOUS
  Filled 2020-08-27 (×14): qty 4

## 2020-08-27 MED ORDER — SODIUM CHLORIDE 0.9% FLUSH: 10.0000 mL | INTRAVENOUS | Status: DC | PRN

## 2020-08-27 NOTE — Progress Notes (Signed)
PATIENT NAME: DESJUAN STEARNS MEDICAL RECORD NUMBER: [id number] Birthday: 12/16/1990  Age: 30 y.o. Admit Date: 08/09/2020  Provider: Mortimer Fries  Indication: COVID 24 and Cardiac arrest  Technical Description: Acute bradycardia while in proned position then PEA/Asystole   CPR performance duration: 4 mins  Medications Administered Include      Yes/no Amiodarone   Atropin   Calcium   Epinephrine 2  Lidocaine   Magnesium   Norepinephrine   Phenylephrine   Sodium bicarbonate 1  Vasopression    Evaluation  Final Status - Was patient successfully resuscitated yes  If successfully resuscitated - what is current rhythmST     [name] 1/8/202212:44 PM     Corrin Parker, M.D.  [facility]  Medical Director [place] Director [facility] Cardio-Pulmonary Department

## 2020-08-27 NOTE — Progress Notes (Signed)
SHIFT SUMMARY:   -Patient successfully placed in prone position at 61:95 am without complication  -Patient has been bradycardic throughout the morning, typical HR in high 50's, MD aware  -At approximately 12:30pm patient discovered PEA/Asystole. CPR started & code blue initiated  -Patient received a total of 2 mg of Epinephrine and one full amp Bicarbonate-see MD note -Patient regained pulse approximately 4 minutes later -Patient still unable to follow commands, non-purposeful movement noted in all extremities  -Sedation resumed at lower settings, see MAR -Labs drawn, see results review 1245 -Per CCM MD, do NOT prone patient again at this time  -Patients spouse has been updated by Dr. Vaughan Browner via phone   -1400: Patient remains supine in sinus rhythm on the ventilator, oxygen saturations low-mid 90's, BP stable  -1600: Patients spouse at bedside

## 2020-08-27 NOTE — Progress Notes (Signed)
[place] responded to Code Blue on ICU; team performing compressions when [facility] arrived; team able to revive pt.  NO family present, but CCM will update family.  No further needs at this time

## 2020-08-27 NOTE — Progress Notes (Signed)
Peripherally Inserted Central Catheter Placement  The IV Nurse has discussed with the patient and/or persons authorized to consent for the patient, the purpose of this procedure and the potential benefits and risks involved with this procedure.  The benefits include less needle sticks, lab draws from the catheter, and the patient may be discharged home with the catheter. Risks include, but not limited to, infection, bleeding, blood clot (thrombus formation), and puncture of an artery; nerve damage and irregular heartbeat and possibility to perform a PICC exchange if needed/ordered by physician.  Alternatives to this procedure were also discussed.  Bard Power PICC patient education guide, fact sheet on infection prevention and patient information card has been provided to patient /or left at bedside.    PICC Placement Documentation  PICC Double Lumen 50/93/26 PICC Right Basilic 39 cm 0 cm (Active)  Indication for Insertion or Continuance of Line Vasoactive infusions 08/27/20 0900  Exposed Catheter (cm) 0 cm 08/27/20 0900  Site Assessment Clean;Dry;Intact 08/27/20 0900  Lumen #1 Status Flushed;Saline locked;Blood return noted 08/27/20 0900  Lumen #2 Status Flushed;Saline locked;Blood return noted 08/27/20 0900  Dressing Type Transparent 08/27/20 0900  Dressing Status Clean;Dry;Intact 08/27/20 0900  Antimicrobial disc in place? Yes 08/27/20 0900  Safety Lock Not Applicable 71/24/58 0998  Line Care Connections checked and tightened 08/27/20 0900  Line Adjustment (NICU/IV Team Only) No 08/27/20 0900  Dressing Intervention New dressing 08/27/20 0900  Dressing Change Due 09/03/20 08/27/20 0900       Craig [name] 08/27/2020, 9:32 AM

## 2020-08-27 NOTE — Progress Notes (Addendum)
PCCM progress note  Patient suffered a brief cardiac arrest while prone.  ROSC achieved in 4 minutes. See separate note.  Called patient's wife to give an update but went to voicemail.  Left a message requesting callback.  Marshell Garfinkel MD [facility] Please see [email or web address] for pager details.  08/27/2020, 12:51 PM   Addendum: [name] called back around 1 PM and was updated regarding events of the day.

## 2020-08-27 NOTE — Progress Notes (Addendum)
CRITICAL CARE NOTE 30 yo male recent hospitalization for perforated diverticulitis s/p colostomy with reversal and leakage of anastomosis with s/p revision now acutely hypoxemic with COVID19 induced severe ARDS.   INTERVAL EVENTS: 08/11/20-patient continued to decline with worsening respiratory status despite 100%Fio2 on BIPAP and HFNC failure. He stated that he feels he is dying and cannot breathe. I discussed with wife [name] earlier today that patient is critically ill may need ETT, she is agreeable and thankful for care. I specifically discussed and explained intubation and mechanical ventilation to patient and he wishes to proceed.  08/12/20- patient was weaned on FiO2 to 60%. Spoke to wife [name].  08/13/20- Patient weaned to 40%. During SBT today he self extubated and was placed on HFNC. He subsequently became hypoxemic, disoriented aggitated, removed PIVs and colostomy and proceeded to have combative behavior with worsening oxygenation. I have attempted to calm patient and encouraged him to cooperate. After numerous attempts patient continued to be combative with severe aggitation with combative behavior and would not wear oxygen. Patient was placed on sedation and MV. Wife updated.  08/14/20-patient remains critically ill. Weaned from 100%>>55%. Called and updated wife [name] today 12/27-Patient with worsening oxygenation on FiO2 100% peep of 14 08/16/20-Patient with continued high oxygen requirements overnight. Have spoken with surgery and there should be no problem proning patient with this. Was febrile yesterday and thus broad spectrum abx started and cultures attained.  08-17-20-patient down to PEEP 10 FiO2 55%. Patient never required proning 08/18/20:FiO2 weaned to 40%, PEEP 10, plan for diuresis with Diamox today, adding PO Clonazepam to assist weaning sedation 08/19/20: Vent changed to Pressure Control this morning: 50% FiO2, 24, 22/5, Plan for Recruitment maneuvers and  diuresis 08/20/2020:Continued issues with hypercapnia due to the dead space ventilation. Worsening despite pressure control. Switch back to [facility], prone positioning 08/23/19- patient proned today without incident, continue to wean FiO2 , currently on 50% 08/24/19-patient weaned to 40% FiO2, plan to continue proning with full scope of care. I have spoken to [name] (wife of patient today) she would like to be present prior to next extubation attempt to help console patient and decrease his aggitation. 1/5 failed weaning trials, ver agitated , severe hypoxia, Wife at bedside witnessed failed SAT. 1/6 severe resp failure, hypoxia 1/7 proning for 16 hrs, severe ARDS 1/8 PICC line placed   CC  Proned yesterday.  Remains critically ill on high vent settings  SUBJECTIVE Proned yesterday.  Remains critically ill on high vent settings  BP 137/81 (BP Location: Right Leg)   Pulse (!) 58   Temp 97.88 F (36.6 C)   Resp (!) 30   Ht 6' 0.01" (1.829 m)   Wt 111.3 kg   SpO2 98%   BMI 33.27 kg/m    I/O last 3 completed shifts: In: 9633.8 [I.V.:1888.1; NG/GT:7745.8] Out: 5080 [Urine:4680; Stool:400] Total I/O In: 112.4 [I.V.:112.4] Out: 225 [Urine:225]  SpO2: 98 % O2 Flow Rate (L/min): 50 L/min FiO2 (%): 50 %  Estimated body mass index is 33.27 kg/m as calculated from the following:   Height as of this encounter: 6' 0.01" (1.829 m).   Weight as of this encounter: 111.3 kg.  SIGNIFICANT EVENTS   REVIEW OF SYSTEMS  PATIENT IS UNABLE TO PROVIDE COMPLETE REVIEW OF SYSTEMS DUE TO SEVERE CRITICAL ILLNESS      COVID-19 DISASTER DECLARATION:   FULL CONTACT PHYSICAL EXAMINATION WAS NOT POSSIBLE DUE TO TREATMENT OF COVID-19  AND CONSERVATION OF PERSONAL PROTECTIVE EQUIPMENT, LIMITED EXAM FINDINGS INCLUDE-  PHYSICAL EXAMINATION: Blood pressure 137/81, pulse (!) 58, temperature 97.88 F (36.6 C), resp. rate (!) 30, height 6' 0.01" (1.829 m), weight 111.3 kg, SpO2 98 %. Gen:      No  acute distress HEENT:  EOMI, sclera anicteric Neck:     No masses; no thyromegaly, ETT Lungs:    Clear to auscultation bilaterally; normal respiratory effort CV:         Regular rate and rhythm; no murmurs Abd:      + bowel sounds; soft, non-tender; no palpable masses, no distension Ext:    No edema; adequate peripheral perfusion Skin:      Warm and dry; no rash Neuro: Sedated, unresponsive   Patient assessed or the symptoms described in the history of present illness.  In the context of the Global COVID-19 pandemic, which necessitated consideration that the patient might be at risk for infection with the SARS-CoV-2 virus that causes COVID-19, Institutional protocols and algorithms that pertain to the evaluation of patients at risk for COVID-19 are in a state of rapid change based on information released by regulatory bodies including the CDC and federal and state organizations. These policies and algorithms were followed during the patient's care while in hospital.  Labs/imaging reviewed.  Significant for BUN/creatinine 28/0.32, WBC 14.8, hemoglobin 10.9, platelets 213 Chest x-ray today reviewed with ET tube in stable portion, slight improvement in bilateral infiltrates  MEDICATIONS: I have reviewed all medications and confirmed regimen as documented   CULTURE RESULTS   No results found for this or any previous visit (from the past 240 hour(s)).        IMAGING    DG Chest Port 1 View  Result Date: 08/27/2020 CLINICAL DATA:  Acute respiratory failure. EXAM: PORTABLE CHEST 1 VIEW COMPARISON:  Radiograph yesterday. FINDINGS: Endotracheal tube tip 4.9 cm from the carina. Enteric tube tip and side-port below the diaphragm in the stomach. Slight improvement in patchy bilateral lung opacities, right greater than left. Stable heart size and mediastinal contours. No pneumomediastinum or pneumothorax. No significant pleural fluid. IMPRESSION: 1. Slight improvement in patchy bilateral lung  opacities since yesterday, right greater than left. 2. Stable support apparatus. Electronically Signed   By: Keith Rake M.D.   On: 08/27/2020 02:57   DG Chest Port 1 View  Result Date: 08/26/2020 CLINICAL DATA:  Intubated patient.  Respiratory distress. EXAM: PORTABLE CHEST 1 VIEW COMPARISON:  08/25/2020 and older exams. FINDINGS: Endotracheal tube tip projects 4 cm above the carina. Nasal/orogastric tube passes well below the diaphragm into the stomach. Lungs demonstrate patchy bilateral airspace opacities without significant change from the previous day's study. No new lung abnormalities. No convincing pleural effusion and no pneumothorax. IMPRESSION: 1. Endotracheal tube tip projects 4 cm above the carina. Well-positioned nasal/orogastric tube. 2. No change in lung aeration with bilateral airspace lung opacities consistent with multifocal infection. Electronically Signed   By: Lajean Manes M.D.   On: 08/26/2020 11:22     Nutrition Status: Nutrition Problem: Inadequate oral intake Etiology: inability to eat (pt sedated and ventilated) Signs/Symptoms: NPO status       Indwelling Urinary Catheter continued, requirement due to   Reason to continue Indwelling Urinary Catheter strict Intake/Output monitoring for hemodynamic instability   Central Line/ continued, requirement due to  Reason to continue [place] of central venous pressure or other hemodynamic parameters and poor IV access   Ventilator continued, requirement due to severe respiratory failure   Ventilator Sedation RASS 0 to -2  ASSESSMENT AND PLAN SYNOPSIS  Acute Hypoxic Respiratory Failurein the setting of COVID-19 Pneumonia with severe ARDS perforated diverticulitis s/p colostomy with reversal and leakage of anastomosis with s/p revision now acutely hypoxemic with COVID19 induced severe ARDS,complicated by delirium and agitation ?ETOh withdrawals   Acute hypoxemic respiratory failure due to  COVID-19 pneumonia / ARDS Severe ACUTE Hypoxic and Hypercapnic Respiratory Failure Continue low tidal volume ventilation Continue proning for P/F ratio less than 150 Diuresis as tolerated by renal function Finished remdesivir.   Start weaning steroids.  Change Solu-Medrol to 40 mg IV every 12   ACUTE DIASTOLIC CARDIAC FAILURE-  Diuresis as tolerated Start Lasix 40 mg IV every 12 hours is 17 L positive.  Follow renal function   Morbid obesity, possible OSA.   Will certainly impact respiratory mechanics, ventilator weaning  ACUTE KIDNEY INJURY/Renal Failure Creatinine appears stable.  Follow renal function    NEUROLOGY Acute toxic metabolic encephalopathy, need for sedation Goal RASS -2 to -3 MRI brain NEG Wean sedation as tolerated   CARDIAC ICU monitoring  ID Finished antibiotics.  Monitor  GI GI PROPHYLAXIS as indicated   DIET-->TF's as tolerated Constipation protocol as indicated  ENDO Levemir dose increased.  Continue SSI    ELECTROLYTES -follow labs as needed -replace as needed -pharmacy consultation and following  Wife called on 1/18 but there was no answer  DVT/GI PRX ordered and assessed TRANSFUSIONS AS NEEDED MONITOR FSBS I Assessed the need for Labs I Assessed the need for Foley I Assessed the need for Central Venous Line Family Discussion when available I Assessed the need for Mobilization I made an Assessment of medications to be adjusted accordingly Safety Risk assessment completed   CASE DISCUSSED IN MULTIDISCIPLINARY ROUNDS WITH ICU TEAM  The patient is critically ill with multiple organ system failure and requires high complexity decision making for assessment and support, frequent evaluation and titration of therapies, advanced monitoring, review of radiographic studies and interpretation of complex data.   Critical Care Time devoted to patient care services, exclusive of separately billable procedures, described in this note is 45  minutes.   Marshell Garfinkel MD [facility] Please see [email or web address] for pager details.  08/27/2020, 11:08 AM

## 2020-08-28 ENCOUNTER — Inpatient Hospital Stay: Payer: No Typology Code available for payment source

## 2020-08-28 DIAGNOSIS — U071 COVID-19: Secondary | ICD-10-CM | POA: Diagnosis not present

## 2020-08-28 DIAGNOSIS — J9601 Acute respiratory failure with hypoxia: Secondary | ICD-10-CM | POA: Diagnosis not present

## 2020-08-28 LAB — CBC WITH DIFFERENTIAL/PLATELET
Abs Immature Granulocytes: 0.4 10*3/uL — ABNORMAL HIGH (ref 0.00–0.07)
Basophils Absolute: 0 10*3/uL (ref 0.0–0.1)
Basophils Relative: 0 %
Eosinophils Absolute: 0 10*3/uL (ref 0.0–0.5)
Eosinophils Relative: 0 %
HCT: 33.8 % — ABNORMAL LOW (ref 39.0–52.0)
Hemoglobin: 10.4 g/dL — ABNORMAL LOW (ref 13.0–17.0)
Immature Granulocytes: 3 %
Lymphocytes Relative: 4 %
Lymphs Abs: 0.6 10*3/uL — ABNORMAL LOW (ref 0.7–4.0)
MCH: 28.7 pg (ref 26.0–34.0)
MCHC: 30.8 g/dL (ref 30.0–36.0)
MCV: 93.4 fL (ref 80.0–100.0)
Monocytes Absolute: 0.6 10*3/uL (ref 0.1–1.0)
Monocytes Relative: 5 %
Neutro Abs: 11.9 10*3/uL — ABNORMAL HIGH (ref 1.7–7.7)
Neutrophils Relative %: 88 %
Platelets: 180 10*3/uL (ref 150–400)
RBC: 3.62 MIL/uL — ABNORMAL LOW (ref 4.22–5.81)
RDW: 17 % — ABNORMAL HIGH (ref 11.5–15.5)
WBC: 13.5 10*3/uL — ABNORMAL HIGH (ref 4.0–10.5)
nRBC: 0.2 % (ref 0.0–0.2)

## 2020-08-28 LAB — BLOOD GAS, ARTERIAL
Acid-Base Excess: 8.8 mmol/L — ABNORMAL HIGH (ref 0.0–2.0)
Acid-Base Excess: 11.4 mmol/L — ABNORMAL HIGH (ref 0.0–2.0)
Bicarbonate: 32.8 mmol/L — ABNORMAL HIGH (ref 20.0–28.0)
Bicarbonate: 37 mmol/L — ABNORMAL HIGH (ref 20.0–28.0)
Delivery systems: 840
FIO2: 0.5
FIO2: 0.4
MECHVT: 550 mL
MECHVT: 550 mL
O2 Saturation: 97.3 %
O2 Saturation: 93.3 %
PEEP: 10 cmH2O
PEEP: 5 cmH2O
Patient temperature: 37
Patient temperature: 37
RATE: 30 resp/min
RATE: 26 resp/min
pCO2 arterial: 42 mmHg (ref 32.0–48.0)
pCO2 arterial: 52 mmHg — ABNORMAL HIGH (ref 32.0–48.0)
pH, Arterial: 7.5 — ABNORMAL HIGH (ref 7.350–7.450)
pH, Arterial: 7.46 — ABNORMAL HIGH (ref 7.350–7.450)
pO2, Arterial: 86 mmHg (ref 83.0–108.0)
pO2, Arterial: 64 mmHg — ABNORMAL LOW (ref 83.0–108.0)

## 2020-08-28 LAB — GLUCOSE, CAPILLARY
Glucose-Capillary: 204 mg/dL — ABNORMAL HIGH (ref 70–99)
Glucose-Capillary: 165 mg/dL — ABNORMAL HIGH (ref 70–99)
Glucose-Capillary: 171 mg/dL — ABNORMAL HIGH (ref 70–99)
Glucose-Capillary: 168 mg/dL — ABNORMAL HIGH (ref 70–99)
Glucose-Capillary: 178 mg/dL — ABNORMAL HIGH (ref 70–99)
Glucose-Capillary: 116 mg/dL — ABNORMAL HIGH (ref 70–99)

## 2020-08-28 LAB — MAGNESIUM: Magnesium: 2.6 mg/dL — ABNORMAL HIGH (ref 1.7–2.4)

## 2020-08-28 LAB — FERRITIN: Ferritin: 414 ng/mL — ABNORMAL HIGH (ref 24–336)

## 2020-08-28 LAB — BASIC METABOLIC PANEL
Anion gap: 10 (ref 5–15)
BUN: 41 mg/dL — ABNORMAL HIGH (ref 6–20)
CO2: 31 mmol/L (ref 22–32)
Calcium: 9 mg/dL (ref 8.9–10.3)
Chloride: 99 mmol/L (ref 98–111)
Creatinine, Ser: 0.47 mg/dL — ABNORMAL LOW (ref 0.61–1.24)
GFR, Estimated: >60 mL/min (ref >60)
Glucose, Bld: 219 mg/dL — ABNORMAL HIGH (ref 70–99)
Potassium: 4.4 mmol/L (ref 3.5–5.1)
Sodium: 140 mmol/L (ref 135–145)

## 2020-08-28 LAB — C-REACTIVE PROTEIN: CRP: 1.4 mg/dL — ABNORMAL HIGH (ref <1.0)

## 2020-08-28 LAB — PHOSPHORUS: Phosphorus: 4.9 mg/dL — ABNORMAL HIGH (ref 2.5–4.6)

## 2020-08-28 LAB — D-DIMER, QUANTITATIVE: D-Dimer, Quant: 1.66 ug/mL-FEU — ABNORMAL HIGH (ref 0.00–0.50)

## 2020-08-28 LAB — PROCALCITONIN: Procalcitonin: 0.14 ng/mL

## 2020-08-28 IMAGING — DX DG CHEST 1V PORT
1 series · 1 of 1 positions shown · non-contrast
Comparison: Radiograph [DATE]

CLINICAL DATA: Acute respiratory failure

EXAM:
PORTABLE CHEST 1 VIEW

[chest ap]
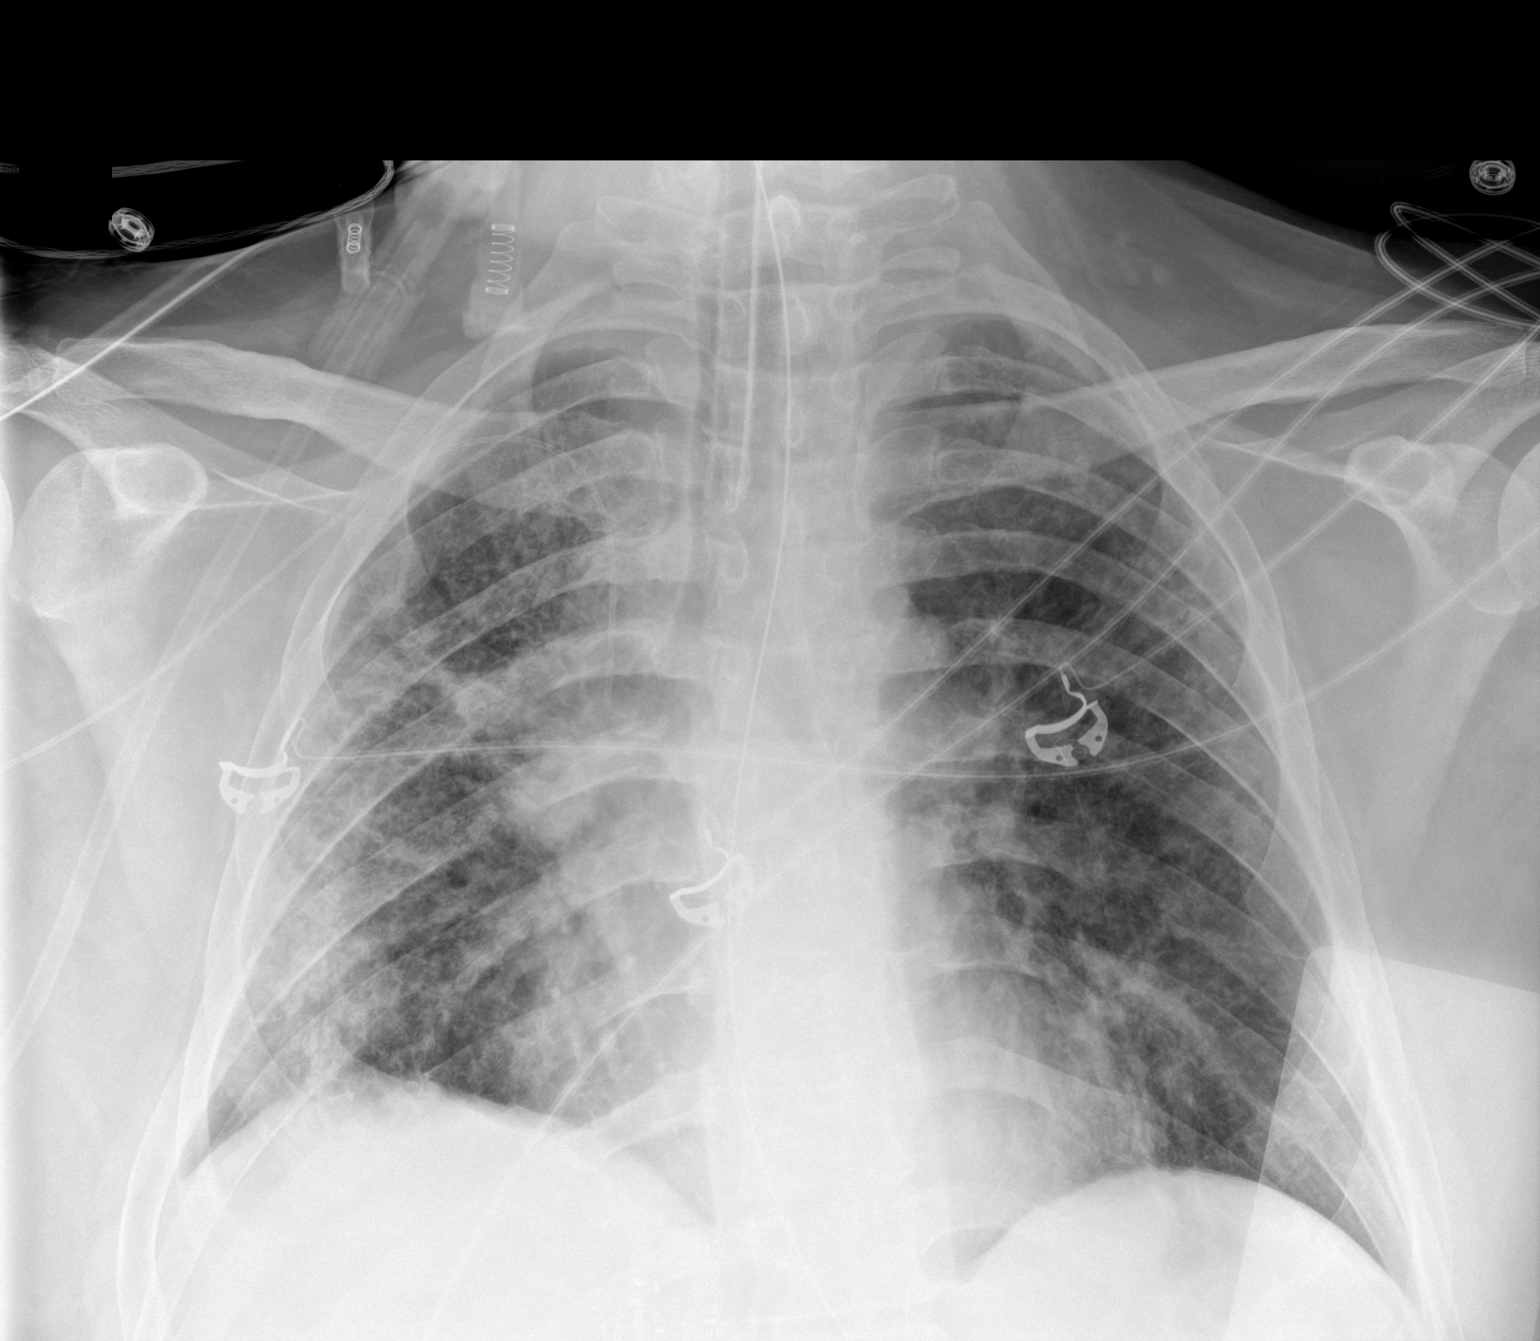

[1 of 1 positions shown; findings below may reference images not displayed]

FINDINGS: Right upper extremity PICC tip terminates in the mid SVC.

Endotracheal tube tip terminates in the mid trachea, 4 cm from the
carina.

Transesophageal tube tip and side port terminate below the margins
of imaging, beyond the GE junction.

Telemetry leads and support devices overlie the chest.

Persistent heterogeneous opacities are present throughout both
lungs, right greater than left similar in extent to prior counting
for differences and pulmonary inflation. Small right effusion with
apical capping. No left effusion. No visible pneumothorax. Stable
cardiomediastinal contours. No acute osseous or soft tissue
abnormality.
IMPRESSION: 1. Persistent heterogeneous opacities throughout both lungs, right
greater than left. Appear similar to prior accounting for slightly
improved lung volumes.
2. Small right effusion with apical capping.
3. Lines and tubes as above.

## 2020-08-28 MED ORDER — STERILE WATER FOR INJECTION IJ SOLN
INTRAMUSCULAR | Status: AC
  Administered 2020-08-28: 8 mL
  Filled 2020-08-28: qty 10

## 2020-08-28 MED ORDER — METOPROLOL TARTRATE 5 MG/5ML IV SOLN
5.0000 mg | Freq: Once | INTRAVENOUS | Status: AC
  Administered 2020-08-28: 5 mg via INTRAVENOUS
  Filled 2020-08-28: qty 5

## 2020-08-28 MED ORDER — OXYCODONE HCL 5 MG PO TABS
5.0000 mg | ORAL_TABLET | Freq: Four times a day (QID) | ORAL | Status: DC
  Administered 2020-08-28 – 2020-09-06 (×33): 5 mg via ORAL
  Filled 2020-08-28 (×33): qty 1

## 2020-08-28 MED FILL — Medication: Qty: 1 | Status: AC

## 2020-08-28 NOTE — Progress Notes (Addendum)
CRITICAL CARE NOTE 30 yo male recent hospitalization for perforated diverticulitis s/p colostomy with reversal and leakage of anastomosis with s/p revision now acutely hypoxemic with COVID19 induced severe ARDS.   INTERVAL EVENTS: 08/11/20-patient continued to decline with worsening respiratory status despite 100%Fio2 on BIPAP and HFNC failure. He stated that he feels he is dying and cannot breathe. I discussed with wife [name] earlier today that patient is critically ill may need ETT, she is agreeable and thankful for care. I specifically discussed and explained intubation and mechanical ventilation to patient and he wishes to proceed.  08/12/20- patient was weaned on FiO2 to 60%. Spoke to wife [name].  08/13/20- Patient weaned to 40%. During SBT today he self extubated and was placed on HFNC. He subsequently became hypoxemic, disoriented aggitated, removed PIVs and colostomy and proceeded to have combative behavior with worsening oxygenation. I have attempted to calm patient and encouraged him to cooperate. After numerous attempts patient continued to be combative with severe aggitation with combative behavior and would not wear oxygen. Patient was placed on sedation and MV. Wife updated.  08/14/20-patient remains critically ill. Weaned from 100%>>55%. Called and updated wife [name] today 12/27-Patient with worsening oxygenation on FiO2 100% peep of 14 08/16/20-Patient with continued high oxygen requirements overnight. Have spoken with surgery and there should be no problem proning patient with this. Was febrile yesterday and thus broad spectrum abx started and cultures attained.  08-17-20-patient down to PEEP 10 FiO2 55%. Patient never required proning 08/18/20:FiO2 weaned to 40%, PEEP 10, plan for diuresis with Diamox today, adding PO Clonazepam to assist weaning sedation 08/19/20: Vent changed to Pressure Control this morning: 50% FiO2, 24, 22/5, Plan for Recruitment maneuvers and  diuresis 08/20/2020:Continued issues with hypercapnia due to the dead space ventilation. Worsening despite pressure control. Switch back to [facility], prone positioning 08/23/19- patient proned today without incident, continue to wean FiO2 , currently on 50% 08/24/19-patient weaned to 40% FiO2, plan to continue proning with full scope of care. I have spoken to [name] (wife of patient today) she would like to be present prior to next extubation attempt to help console patient and decrease his aggitation. 1/5 failed weaning trials, ver agitated , severe hypoxia, Wife at bedside witnessed failed SAT. 1/6 severe resp failure, hypoxia 1/7 proning for 16 hrs, severe ARDS 1/8 PICC line placed, Had a brief PEA arrest while prone. Proning discontiued  CC  Severe ARDS secondary to COVID-19  SUBJECTIVE Had a brief cardiac arrest while prone.  He was successfully resuscitated.  Remained stable overnight.  Moving extremities at present  BP (!) 96/58   Pulse (!) 58   Temp 98.6 F (37 C)   Resp (!) 26   Ht 6' 0.01" (1.829 m)   Wt 111.3 kg   SpO2 100%   BMI 33.27 kg/m    I/O last 3 completed shifts: In: 3797.8 [I.V.:1322.8; NG/GT:2475] Out: 3525 [id number]; Stool:400] No intake/output data recorded.  SpO2: 100 % O2 Flow Rate (L/min): 50 L/min FiO2 (%): 40 %  Estimated body mass index is 33.27 kg/m as calculated from the following:   Height as of this encounter: 6' 0.01" (1.829 m).   Weight as of this encounter: 111.3 kg.  SIGNIFICANT EVENTS   REVIEW OF SYSTEMS  PATIENT IS UNABLE TO PROVIDE COMPLETE REVIEW OF SYSTEMS DUE TO SEVERE CRITICAL ILLNESS      COVID-19 DISASTER DECLARATION:   FULL CONTACT PHYSICAL EXAMINATION WAS NOT POSSIBLE DUE TO TREATMENT OF COVID-19  AND CONSERVATION OF PERSONAL PROTECTIVE  EQUIPMENT, LIMITED EXAM FINDINGS INCLUDE-   PHYSICAL EXAMINATION: Blood pressure (!) 96/58, pulse (!) 58, temperature 98.6 F (37 C), resp. rate (!) 26, height 6' 0.01" (1.829  m), weight 111.3 kg, SpO2 100 %. Gen:      No acute distress HEENT:  EOMI, sclera anicteric Neck:     No masses; no thyromegaly, ETT Lungs:    Clear to auscultation bilaterally; normal respiratory effort CV:         Regular rate and rhythm; no murmurs Abd:      + bowel sounds; soft, non-tender; no palpable masses, no distension Ext:    No edema; adequate peripheral perfusion Skin:      Warm and dry; no rash Neuro: Sedated, moves extremities.  Lab/imaging reviewed Significant for BUN/creatinine 41/0.47 WBC 13.5, hemoglobin 10.4, platelets 180 Chest x-ray 1/9 reviewed with bilateral airspace disease  Patient assessed or the symptoms described in the history of present illness.  In the context of the Global COVID-19 pandemic, which necessitated consideration that the patient might be at risk for infection with the SARS-CoV-2 virus that causes COVID-19, Institutional protocols and algorithms that pertain to the evaluation of patients at risk for COVID-19 are in a state of rapid change based on information released by regulatory bodies including the CDC and federal and state organizations. These policies and algorithms were followed during the patient's care while in hospital.   MEDICATIONS: I have reviewed all medications and confirmed regimen as documented   CULTURE RESULTS   No results found for this or any previous visit (from the past 240 hour(s)).        IMAGING    DG Chest Port 1 View  Result Date: 08/28/2020 CLINICAL DATA:  Acute respiratory failure EXAM: PORTABLE CHEST 1 VIEW COMPARISON:  Radiograph 08/27/2020 FINDINGS: Right upper extremity PICC tip terminates in the mid SVC. Endotracheal tube tip terminates in the mid trachea, 4 cm from the carina. Transesophageal tube tip and side port terminate below the margins of imaging, beyond the GE junction. Telemetry leads and support devices overlie the chest. Persistent heterogeneous opacities are present throughout both lungs,  right greater than left similar in extent to prior counting for differences and pulmonary inflation. Small right effusion with apical capping. No left effusion. No visible pneumothorax. Stable cardiomediastinal contours. No acute osseous or soft tissue abnormality. IMPRESSION: 1. Persistent heterogeneous opacities throughout both lungs, right greater than left. Appear similar to prior accounting for slightly improved lung volumes. 2. Small right effusion with apical capping. 3. Lines and tubes as above. Electronically Signed   By: Lovena Le M.D.   On: 08/28/2020 04:28   DG Chest Port 1 View  Result Date: 08/27/2020 CLINICAL DATA:  COVID-19.  Cardiac arrest. EXAM: PORTABLE CHEST 1 VIEW COMPARISON:  August 27, 2020 FINDINGS: The ETT is in good position. The NG tube terminates below today's film. Bilateral pulmonary infiltrates remain, right greater than left, increased in the interval. No pneumothorax. A new right PICC line terminates in the central SVC. No other interval changes. IMPRESSION: 1. Support apparatus as above. 2. Bilateral pulmonary infiltrates, right greater than left, increased in the interval. Electronically Signed   By: Dorise Bullion III M.D   On: 08/27/2020 12:57     Nutrition Status: Nutrition Problem: Inadequate oral intake Etiology: inability to eat (pt sedated and ventilated) Signs/Symptoms: NPO status       Indwelling Urinary Catheter continued, requirement due to   Reason to continue Indwelling Urinary Catheter strict Intake/Output monitoring for  hemodynamic instability   Central Line/ continued, requirement due to  Reason to continue [employer] of central venous pressure or other hemodynamic parameters and poor IV access   Ventilator continued, requirement due to severe respiratory failure   Ventilator Sedation RASS 0 to -2      ASSESSMENT AND PLAN SYNOPSIS  Acute Hypoxic Respiratory Failurein the setting of COVID-19 Pneumonia with severe  ARDS perforated diverticulitis s/p colostomy with reversal and leakage of anastomosis with s/p revision now acutely hypoxemic with COVID19 induced severe ARDS,complicated by delirium and agitation ?ETOh withdrawals   Acute hypoxemic respiratory failure due to COVID-19 pneumonia / ARDS Severe ACUTE Hypoxic and Hypercapnic Respiratory Failure Continue low tidal volume ventilation No more proning as P/F ratio is adequate and he had a cardiac arrest yesterday Diuresis as tolerated by renal function.   Finished remdesivir.   Steroids reduced to 40 mg every 12 on 1/8.  Continue slow wean.   ACUTE DIASTOLIC CARDIAC FAILURE-  Diuresis as tolerated Increase Lasix to 40 mg 4 times daily for 24 hours.  Remains fluid overloaded with 17 L positive  Morbid obesity, possible OSA.   Will certainly impact respiratory mechanics, ventilator weaning  ACUTE KIDNEY INJURY/Renal Failure Creatinine appears stable.  Follow renal function    NEUROLOGY Acute toxic metabolic encephalopathy, need for sedation Goal RASS -2 to -3 MRI brain NEG He is on significant amount of sedation We will start OxyIR and wean off Dilaudid. Weaning off Versed drip.  Continue propofol for now.  CARDIAC ICU monitoring  ID Finished antibiotics.  Monitor  GI GI PROPHYLAXIS as indicated   DIET-->TF's as tolerated Constipation protocol as indicated  ENDO Levemir dose increased.  Continue SSI  ELECTROLYTES -follow labs as needed -replace as needed -pharmacy consultation and following  Wife updated 1/8. No answer on 1/9  DVT/GI PRX ordered and assessed TRANSFUSIONS AS NEEDED MONITOR FSBS I Assessed the need for Labs I Assessed the need for Foley I Assessed the need for Central Venous Line Family Discussion when available I Assessed the need for Mobilization I made an Assessment of medications to be adjusted accordingly Safety Risk assessment completed   CASE DISCUSSED IN [place]  ICU TEAM  The patient is critically ill with multiple organ system failure and requires high complexity decision making for assessment and support, frequent evaluation and titration of therapies, advanced monitoring, review of radiographic studies and interpretation of complex data.   Critical Care Time devoted to patient care services, exclusive of separately billable procedures, described in this note is 35 minutes.   Marshell Garfinkel MD [facility] Please see [email or web address] for pager details.  08/28/2020, 9:00 AM

## 2020-08-28 NOTE — Progress Notes (Signed)
Stable day. Remains mostly in [place] today. Heart rate reached 150s  several times- treated with IV Lopressor. Started on Oxycodone 5 mg every 6 hours. Tolerated change well until 1500. Vecuronium given every hour x 2 for dis synchrony of ventilator. Calmer after wife visited x 2 hour this pm. Tachypnea noted several times throughout the day. Oxygen saturation not as high on right side. Remains on Propofol, Versed and Dilaudid.

## 2020-08-29 ENCOUNTER — Inpatient Hospital Stay: Payer: No Typology Code available for payment source

## 2020-08-29 ENCOUNTER — Encounter: Payer: Self-pay | Admitting: Family Medicine

## 2020-08-29 DIAGNOSIS — N179 Acute kidney failure, unspecified: Secondary | ICD-10-CM | POA: Diagnosis not present

## 2020-08-29 DIAGNOSIS — U071 COVID-19: Secondary | ICD-10-CM | POA: Diagnosis not present

## 2020-08-29 DIAGNOSIS — J9601 Acute respiratory failure with hypoxia: Secondary | ICD-10-CM | POA: Diagnosis not present

## 2020-08-29 LAB — CBC WITH DIFFERENTIAL/PLATELET
Abs Immature Granulocytes: 0.26 10*3/uL — ABNORMAL HIGH (ref 0.00–0.07)
Basophils Absolute: 0 10*3/uL (ref 0.0–0.1)
Basophils Relative: 0 %
Eosinophils Absolute: 0 10*3/uL (ref 0.0–0.5)
Eosinophils Relative: 0 %
HCT: 32.8 % — ABNORMAL LOW (ref 39.0–52.0)
Hemoglobin: 10.2 g/dL — ABNORMAL LOW (ref 13.0–17.0)
Immature Granulocytes: 2 %
Lymphocytes Relative: 3 %
Lymphs Abs: 0.5 10*3/uL — ABNORMAL LOW (ref 0.7–4.0)
MCH: 28.9 pg (ref 26.0–34.0)
MCHC: 31.1 g/dL (ref 30.0–36.0)
MCV: 92.9 fL (ref 80.0–100.0)
Monocytes Absolute: 0.6 10*3/uL (ref 0.1–1.0)
Monocytes Relative: 4 %
Neutro Abs: 13.1 10*3/uL — ABNORMAL HIGH (ref 1.7–7.7)
Neutrophils Relative %: 91 %
Platelets: 137 10*3/uL — ABNORMAL LOW (ref 150–400)
RBC: 3.53 MIL/uL — ABNORMAL LOW (ref 4.22–5.81)
RDW: 17.1 % — ABNORMAL HIGH (ref 11.5–15.5)
WBC: 14.5 10*3/uL — ABNORMAL HIGH (ref 4.0–10.5)
nRBC: 0.1 % (ref 0.0–0.2)

## 2020-08-29 LAB — BASIC METABOLIC PANEL
Anion gap: 14 (ref 5–15)
BUN: 37 mg/dL — ABNORMAL HIGH (ref 6–20)
CO2: 32 mmol/L (ref 22–32)
Calcium: 8.6 mg/dL — ABNORMAL LOW (ref 8.9–10.3)
Chloride: 92 mmol/L — ABNORMAL LOW (ref 98–111)
Creatinine, Ser: 0.46 mg/dL — ABNORMAL LOW (ref 0.61–1.24)
GFR, Estimated: >60 mL/min (ref >60)
Glucose, Bld: 219 mg/dL — ABNORMAL HIGH (ref 70–99)
Potassium: 4.2 mmol/L (ref 3.5–5.1)
Sodium: 138 mmol/L (ref 135–145)

## 2020-08-29 LAB — D-DIMER, QUANTITATIVE: D-Dimer, Quant: 1.38 ug/mL-FEU — ABNORMAL HIGH (ref 0.00–0.50)

## 2020-08-29 LAB — GLUCOSE, CAPILLARY
Glucose-Capillary: 195 mg/dL — ABNORMAL HIGH (ref 70–99)
Glucose-Capillary: 184 mg/dL — ABNORMAL HIGH (ref 70–99)
Glucose-Capillary: 126 mg/dL — ABNORMAL HIGH (ref 70–99)
Glucose-Capillary: 93 mg/dL (ref 70–99)
Glucose-Capillary: 83 mg/dL (ref 70–99)
Glucose-Capillary: 97 mg/dL (ref 70–99)
Glucose-Capillary: 146 mg/dL — ABNORMAL HIGH (ref 70–99)
Glucose-Capillary: 109 mg/dL — ABNORMAL HIGH (ref 70–99)

## 2020-08-29 LAB — FERRITIN: Ferritin: 486 ng/mL — ABNORMAL HIGH (ref 24–336)

## 2020-08-29 LAB — MAGNESIUM: Magnesium: 2.3 mg/dL (ref 1.7–2.4)

## 2020-08-29 LAB — PHOSPHORUS: Phosphorus: 4 mg/dL (ref 2.5–4.6)

## 2020-08-29 LAB — C-REACTIVE PROTEIN: CRP: 1.5 mg/dL — ABNORMAL HIGH (ref <1.0)

## 2020-08-29 IMAGING — DX DG ABDOMEN 1V
1 series · 1 of 1 positions shown · non-contrast
Comparison: [DATE]

CLINICAL DATA: Line placement

EXAM:
ABDOMEN - 1 VIEW

[abdomen supine]
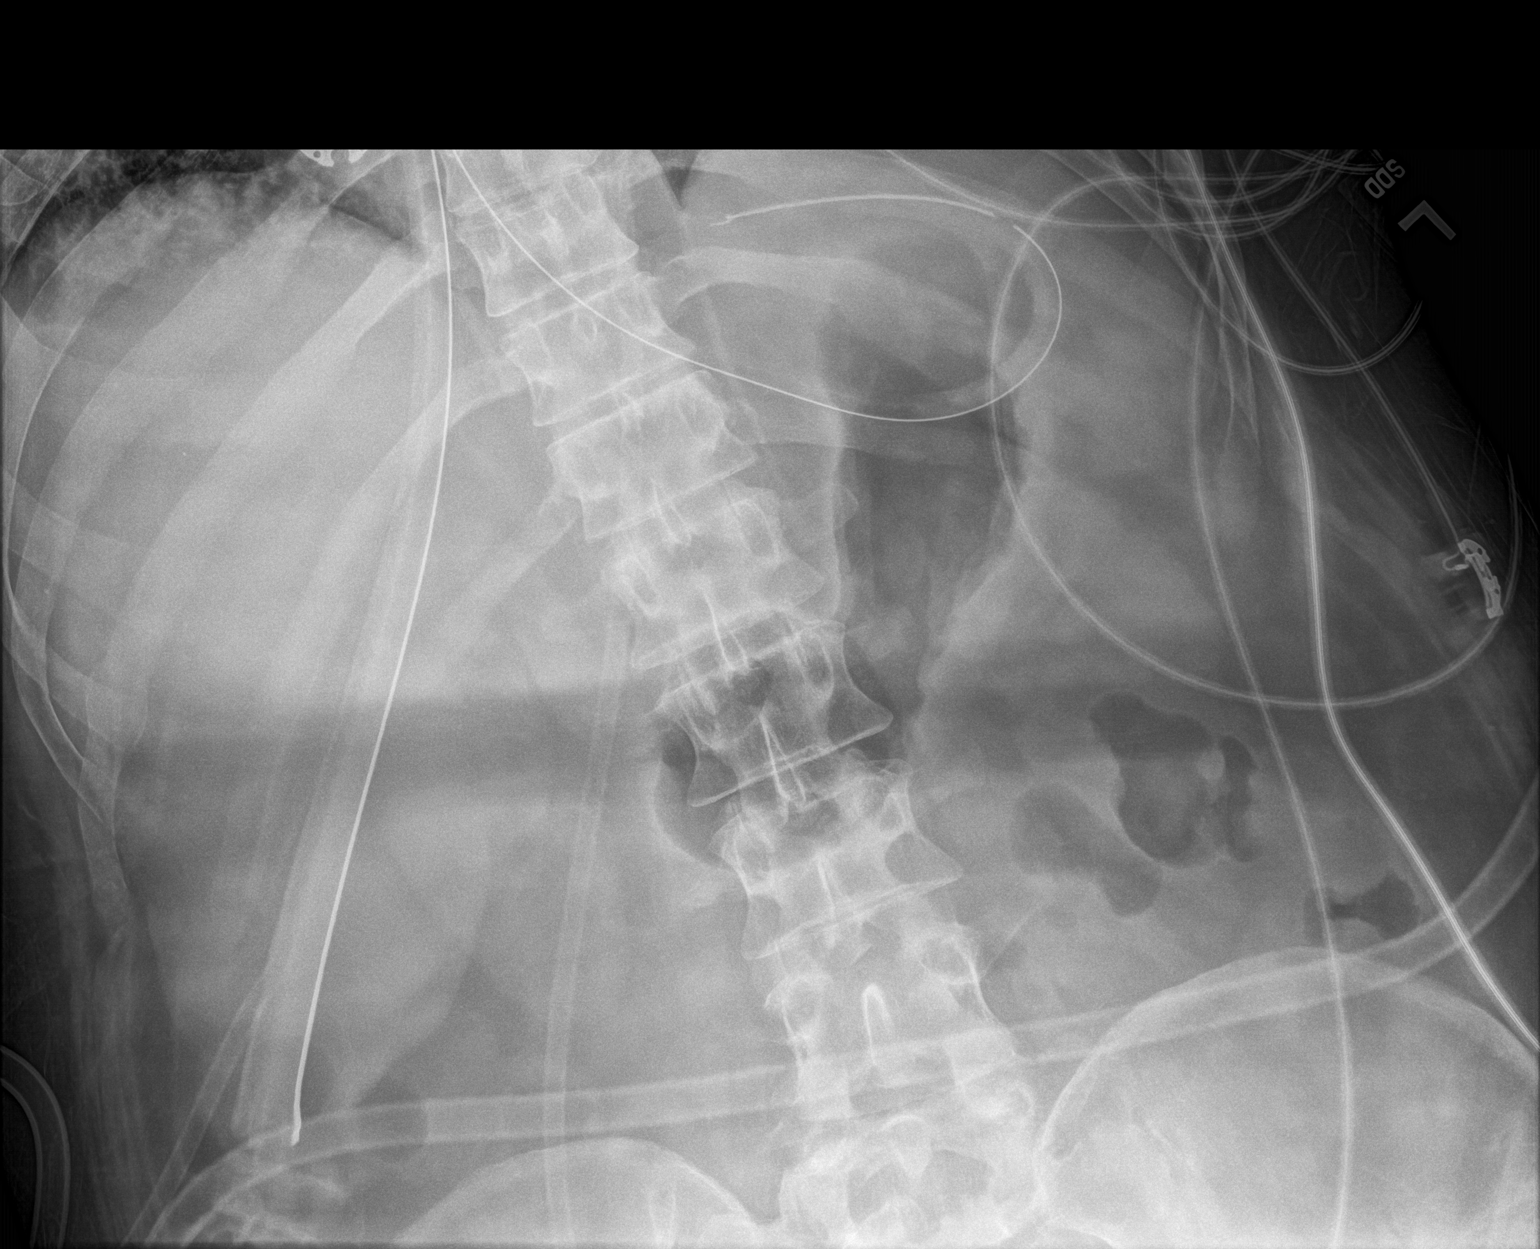

[1 of 1 positions shown; findings below may reference images not displayed]

FINDINGS: The enteric tube projects over the gastric body/fundus. The bowel
gas pattern is nonobstructive where visualized. There are no
definite radiopaque kidney stones.
IMPRESSION: Enteric tube projects over the gastric body/fundus.

## 2020-08-29 IMAGING — DX DG CHEST 1V PORT
1 series · 1 of 1 positions shown · non-contrast
Comparison: [DATE]

CLINICAL DATA: Acute respiratory failure

EXAM:
PORTABLE CHEST 1 VIEW

[chest ap]
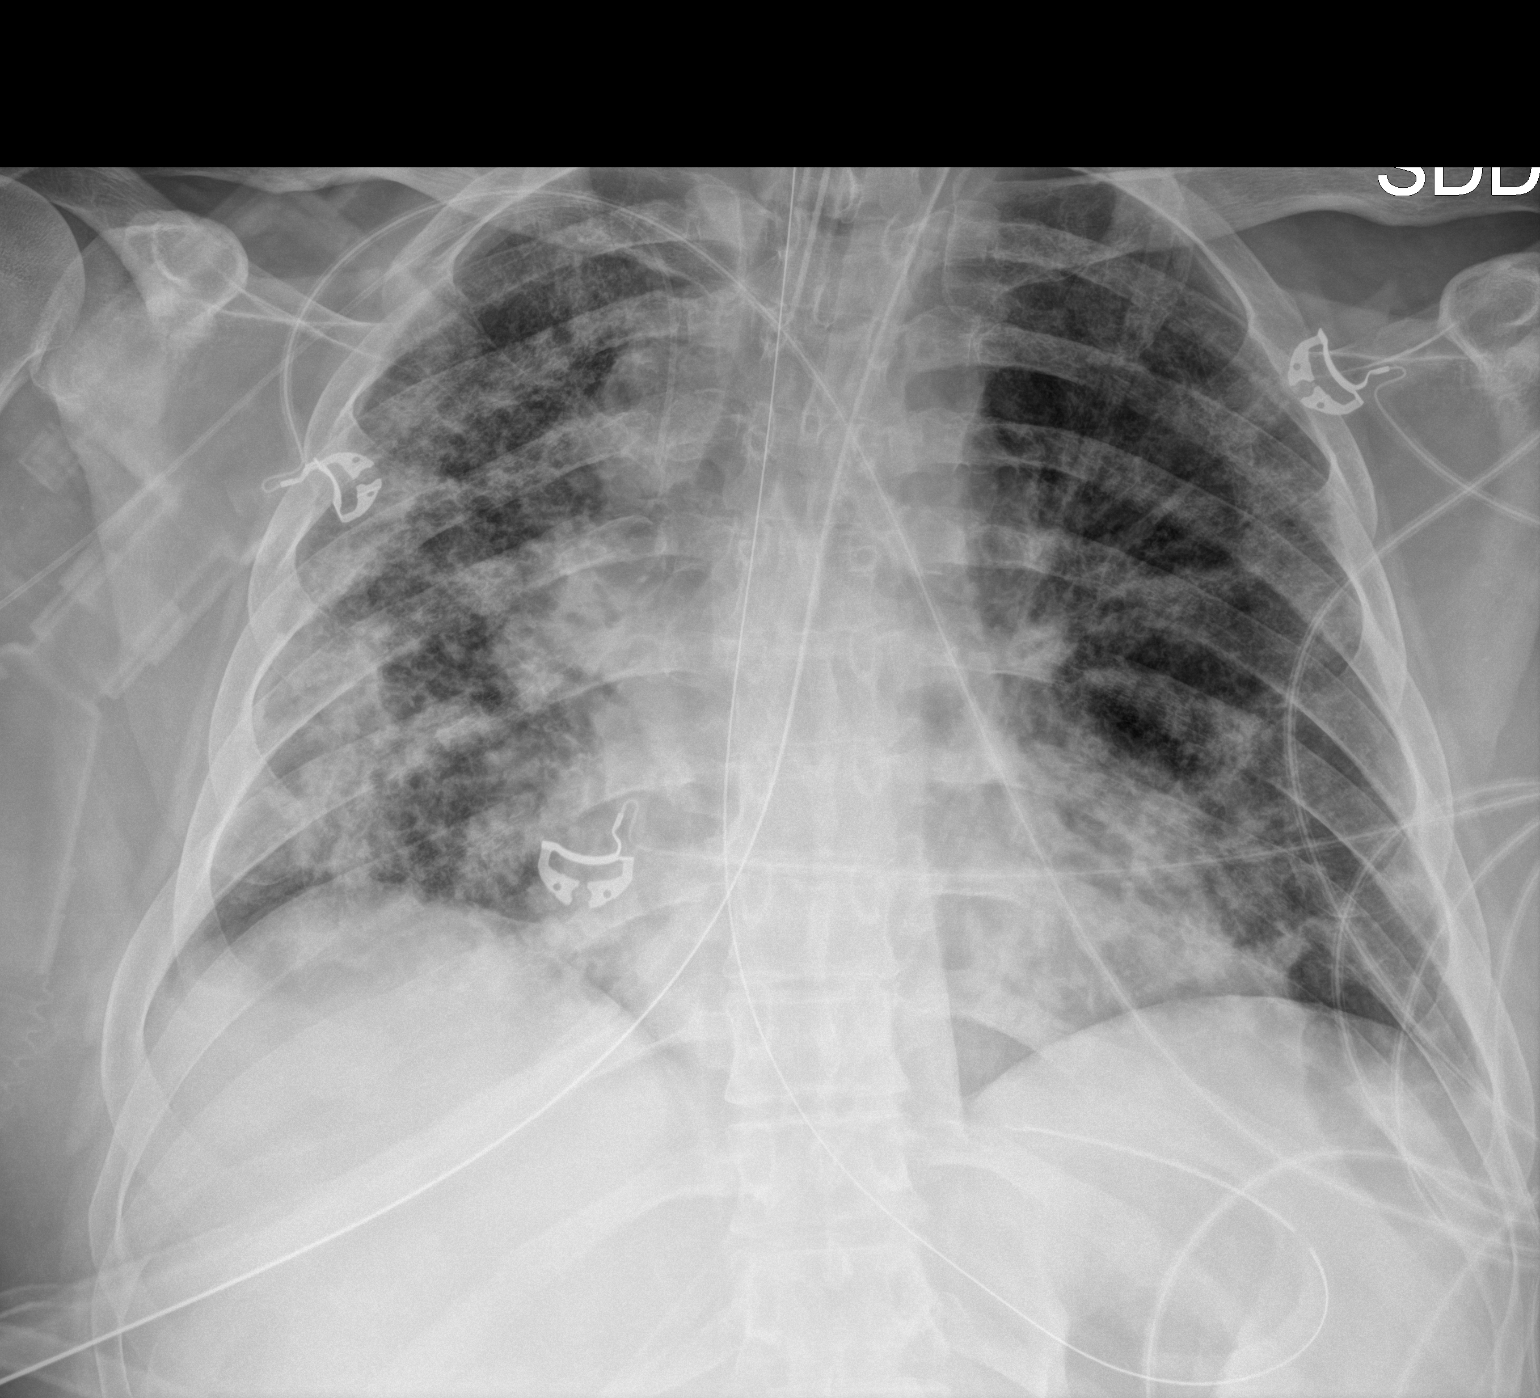

[1 of 1 positions shown; findings below may reference images not displayed]

FINDINGS: The endotracheal tube terminates above the carina. The right-sided
PICC line is well position. The enteric tube extends below the left
hemidiaphragm. There are diffuse bilateral coarse airspace
opacities, similar to prior study. There is no pneumothorax. No
definite large pleural effusion.
IMPRESSION: No significant short interval change.

## 2020-08-29 IMAGING — DX DG CHEST 1V PORT
1 series · 1 of 1 positions shown · non-contrast
Comparison: Yesterday

CLINICAL DATA: Acute respiratory failure

EXAM:
PORTABLE CHEST 1 VIEW

[chest ap]
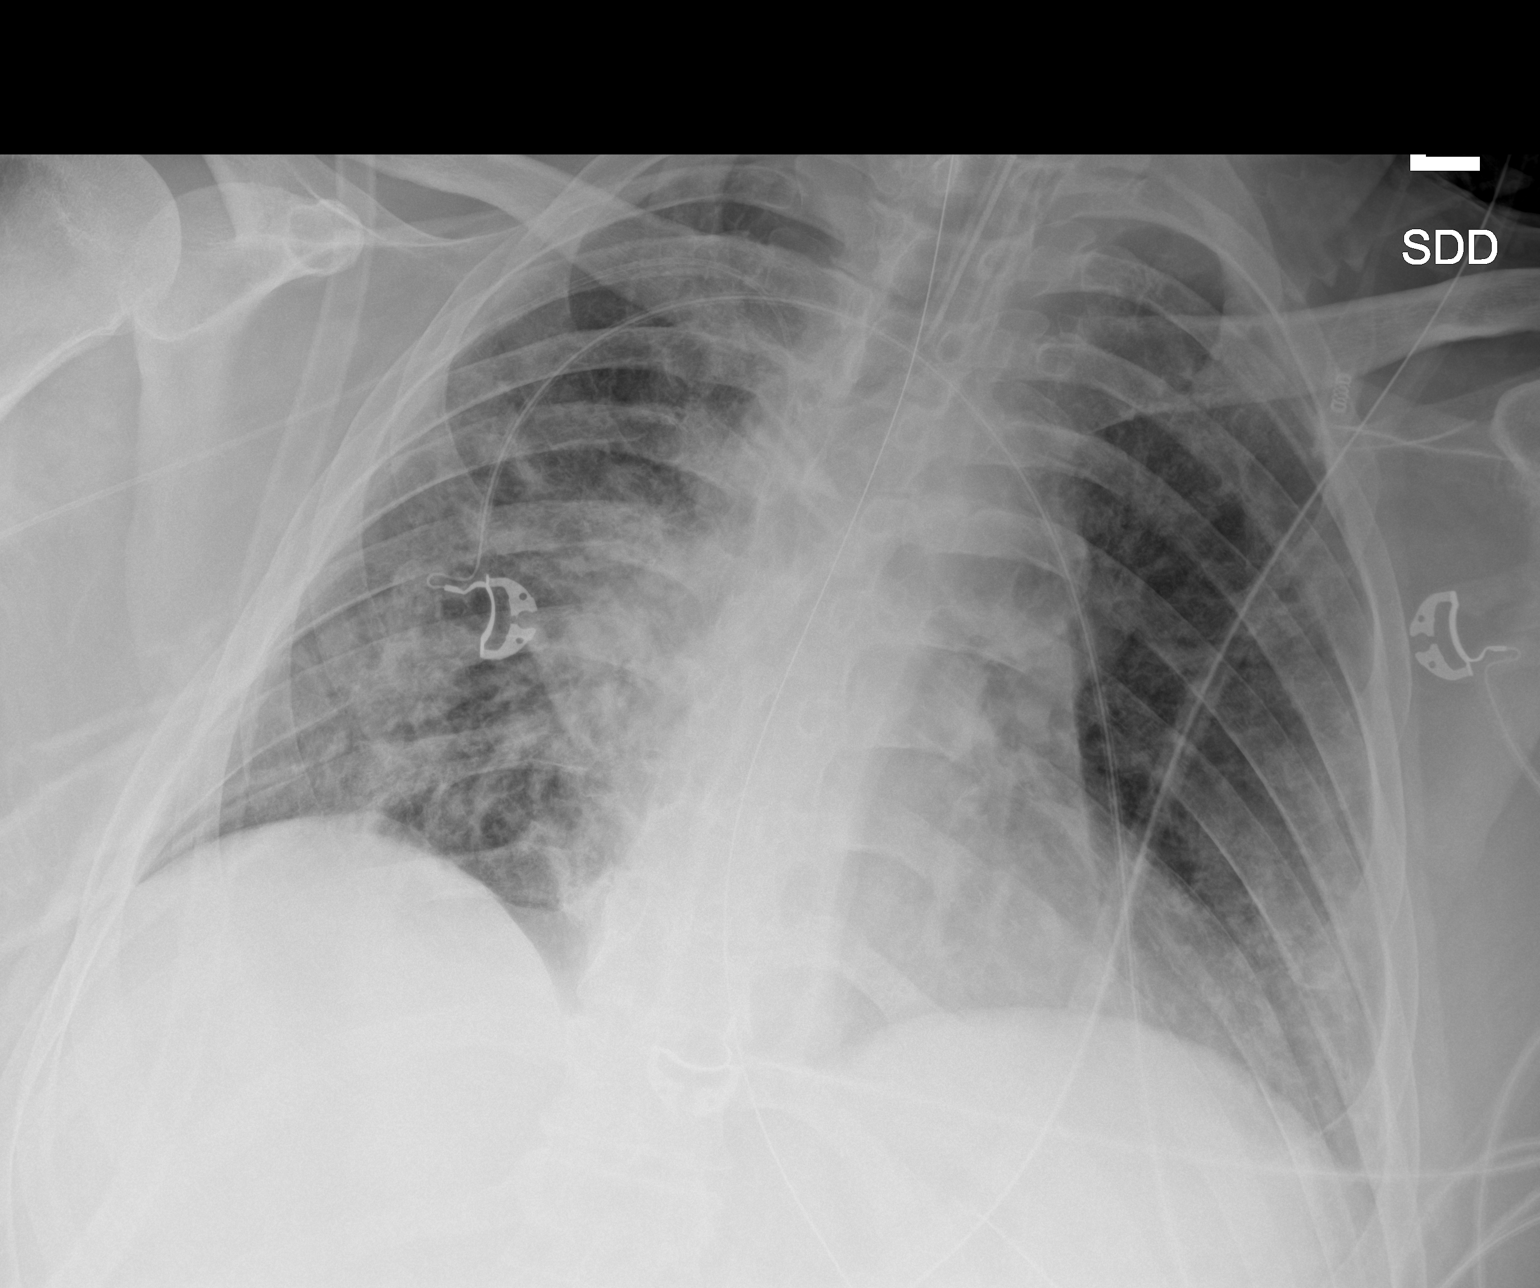

[1 of 1 positions shown; findings below may reference images not displayed]

FINDINGS: Endotracheal tube tip is at the clavicular heads. The enteric tube
reaches the stomach. Right PICC with tip at the SVC.

Bilateral pulmonary infiltrate which is unchanged. No visible
effusion or air leak. Stable heart size.
IMPRESSION: Stable hardware positioning and bilateral pneumonia.

## 2020-08-29 NOTE — Progress Notes (Incomplete)
SHIFT SUMMARY:  -Patient remains on Dilaudid, Versed and Propofol gtt  -Sedation decreased throughout shift as tolerated -Patients wife updated via phone call this AM -SBT deferred per CCM MD, will attempt tomorrow -Wife visited at bedside this afternoon -Patient had brief episode of tachycardia & HTN, decreased oxygenation sats, treated with PRN Vecuronium & Versed--patient recovered quickly -Patient rested comfortably throughout shift -Vital signs remain stable

## 2020-08-29 NOTE — Progress Notes (Signed)
CRITICAL CARE NOTE 30 yo male recent hospitalization for perforated diverticulitis s/p colostomy with reversal and leakage of anastomosis with s/p revision now acutely hypoxemic with COVID19 induced severe ARDS.   INTERVAL EVENTS: 08/11/20-patient continued to decline with worsening respiratory status despite 100%Fio2 on BIPAP and HFNC failure. He stated that he feels he is dying and cannot breathe. I discussed with wife [name] earlier today that patient is critically ill may need ETT, she is agreeable and thankful for care. I specifically discussed and explained intubation and mechanical ventilation to patient and he wishes to proceed.  08/12/20- patient was weaned on FiO2 to 60%. Spoke to wife [name].  08/13/20- Patient weaned to 40%. During SBT today he self extubated and was placed on HFNC. He subsequently became hypoxemic, disoriented aggitated, removed PIVs and colostomy and proceeded to have combative behavior with worsening oxygenation. I have attempted to calm patient and encouraged him to cooperate. After numerous attempts patient continued to be combative with severe aggitation with combative behavior and would not wear oxygen. Patient was placed on sedation and MV. Wife updated.  08/14/20-patient remains critically ill. Weaned from 100%>>55%. Called and updated wife [name] today 12/27-Patient with worsening oxygenation on FiO2 100% peep of 14 08/16/20-Patient with continued high oxygen requirements overnight. Have spoken with surgery and there should be no problem proning patient with this. Was febrile yesterday and thus broad spectrum abx started and cultures attained.  08-17-20-patient down to PEEP 10 FiO2 55%. Patient never required proning 08/18/20:FiO2 weaned to 40%, PEEP 10, plan for diuresis with Diamox today, adding PO Clonazepam to assist weaning sedation 08/19/20: Vent changed to Pressure Control this morning: 50% FiO2, 24, 22/5, Plan for Recruitment maneuvers and  diuresis 08/20/2020:Continued issues with hypercapnia due to the dead space ventilation. Worsening despite pressure control. Switch back to [facility], prone positioning 08/23/19- patient proned today without incident, continue to wean FiO2 , currently on 50% 08/24/19-patient weaned to 40% FiO2, plan to continue proning with full scope of care. I have spoken to [name] (wife of patient today) she would like to be present prior to next extubation attempt to help console patient and decrease his aggitation. 1/9filed weaning trials, ver agitated , severe hypoxia, Wife at bedside witnessed failed SAT. 1/6 severe resp failure, hypoxia 1/7 proning for 16 hrs, severe ARDS 1/8 PICC line placed, Had a brief PEA arrest while prone. Proning discontinued 1/10 severe resp failure    CC  follow up respiratory failure  SUBJECTIVE Patient remains critically ill Prognosis is guarded  Vent Mode: PRVC FiO2 (%):  [40 %] 40 % Set Rate:  [26 bmp] 26 bmp Vt Set:  [550 mL] 550 mL PEEP:  [10 cmH20] 10 cmH20 Plateau Pressure:  [26 cmH20-27 cmH20] 27 cmH20 CBC    Component Value Date/Time   WBC 14.5 (H) 08/29/2020 0431   RBC 3.53 (L) 08/29/2020 0431   HGB 10.2 (L) 08/29/2020 0431   HCT 32.8 (L) 08/29/2020 0431   PLT 137 (L) 08/29/2020 0431   MCV 92.9 08/29/2020 0431   MCH 28.9 08/29/2020 0431   MCHC 31.1 08/29/2020 0431   RDW 17.1 (H) 08/29/2020 0431   LYMPHSABS 0.5 (L) 08/29/2020 0431   MONOABS 0.6 08/29/2020 0431   EOSABS 0.0 08/29/2020 0431   BASOSABS 0.0 08/29/2020 0431   BMP Latest Ref Rng & Units 08/29/2020 08/28/2020 08/27/2020  Glucose 70 - 99 mg/dL 219(H) 219(H) 158(H)  BUN 6 - 20 mg/dL 37(H) 41(H) 28(H)  Creatinine 0.61 - 1.24 mg/dL 0.46(L) 0.47(L) 0.47(L)  Sodium 135 - 145 mmol/L 138 140 144  Potassium 3.5 - 5.1 mmol/L 4.2 4.4 3.9  Chloride 98 - 111 mmol/L 92(L) 99 99  CO2 22 - 32 mmol/L 32 31 32  Calcium 8.9 - 10.3 mg/dL 8.6(L) 9.0 9.5    BP 108/60   Pulse 81   Temp 98.78 F (37.1  C)   Resp (!) 26   Ht 6' 0.01" (1.829 m)   Wt 108.2 kg   SpO2 100%   BMI 32.34 kg/m    I/O last 3 completed shifts: In: 9379 [I.V.:1313; NG/GT:2950] Out: 4620 [Urine:4270; Stool:350] No intake/output data recorded.  SpO2: 100 % O2 Flow Rate (L/min): 50 L/min FiO2 (%): 40 %  Estimated body mass index is 32.34 kg/m as calculated from the following:   Height as of this encounter: 6' 0.01" (1.829 m).   Weight as of this encounter: 108.2 kg.  SIGNIFICANT EVENTS   REVIEW OF SYSTEMS  PATIENT IS UNABLE TO PROVIDE COMPLETE REVIEW OF SYSTEMS DUE TO SEVERE CRITICAL ILLNESS      COVID-19 DISASTER DECLARATION:   FULL CONTACT PHYSICAL EXAMINATION WAS NOT POSSIBLE DUE TO TREATMENT OF COVID-19  AND CONSERVATION OF PERSONAL PROTECTIVE EQUIPMENT, LIMITED EXAM FINDINGS INCLUDE-   PHYSICAL EXAMINATION:  GENERAL:critically ill appearing, +resp distress NEUROLOGIC: obtunded, GCS<8   Patient assessed or the symptoms described in the history of present illness.  In the context of the Global COVID-19 pandemic, which necessitated consideration that the patient might be at risk for infection with the SARS-CoV-2 virus that causes COVID-19, Institutional protocols and algorithms that pertain to the evaluation of patients at risk for COVID-19 are in a state of rapid change based on information released by regulatory bodies including the CDC and federal and state organizations. These policies and algorithms were followed during the patient's care while in hospital.    MEDICATIONS: I have reviewed all medications and confirmed regimen as documented   CULTURE RESULTS   No results found for this or any previous visit (from the past 240 hour(s)).        IMAGING    DG Chest Port 1 View  Result Date: 08/29/2020 CLINICAL DATA:  Acute respiratory failure EXAM: PORTABLE CHEST 1 VIEW COMPARISON:  Yesterday FINDINGS: Endotracheal tube tip is at the clavicular heads. The enteric tube reaches  the stomach. Right PICC with tip at the SVC. Bilateral pulmonary infiltrate which is unchanged. No visible effusion or air leak. Stable heart size. IMPRESSION: Stable hardware positioning and bilateral pneumonia. Electronically Signed   By: Monte Fantasia M.D.   On: 08/29/2020 05:47     Nutrition Status: Nutrition Problem: Inadequate oral intake Etiology: inability to eat (pt sedated and ventilated) Signs/Symptoms: NPO status       Indwelling Urinary Catheter continued, requirement due to   Reason to continue Indwelling Urinary Catheter strict Intake/Output monitoring for hemodynamic instability   Central Line/ continued, requirement due to  Reason to continue [place] of central venous pressure or other hemodynamic parameters and poor IV access   Ventilator continued, requirement due to severe respiratory failure   Ventilator Sedation RASS 0 to -2      ASSESSMENT AND PLAN SYNOPSIS   Acute Hypoxic Respiratory Failurein the setting of COVID-19 Pneumonia with severe ARDS perforated diverticulitis s/p colostomy with reversal and leakage of anastomosis with s/p revision now acutely hypoxemic with COVID19 induced severe ARDS,complicated by delirium and agitation ?ETOh withdrawals   Acute hypoxemic respiratory failure due to COVID-19 pneumonia Diuresis as blood pressure  and renal function can tolerate, goal CVP 5-8.   diuresis as tolerated based on Kidney function VAP prevention order set Remdesivir  IV STEROIDS   Severe ACUTE Hypoxic and Hypercapnic Respiratory Failure -continue Full MV support -continue Bronchodilator Therapy -Wean Fio2 and PEEP as tolerated -will perform SAT/SBT when respiratory parameters are met -VAP/VENT bundle implementation  ACUTE DIASTOLIC CARDIAC FAILURE-  -oxygen as needed -Lasix as tolerated  Morbid obesity, possible OSA.   Will certainly impact respiratory mechanics, ventilator weaning Suspect will need to consider  additional PEEP    NEUROLOGY Acute toxic metabolic encephalopathy, need for sedation Goal RASS -2 to -3 MRI brain neg  CARDIAC ICU monitoring  ID -continue IV abx as prescibed -follow up cultures  GI GI PROPHYLAXIS as indicated   DIET-->TF's as tolerated Constipation protocol as indicated  ENDO - will use ICU hypoglycemic\Hyperglycemia protocol if indicated     ELECTROLYTES -follow labs as needed -replace as needed -pharmacy consultation and following   DVT/GI PRX ordered and assessed TRANSFUSIONS AS NEEDED MONITOR FSBS I Assessed the need for Labs I Assessed the need for Foley I Assessed the need for Central Venous Line Family Discussion when available I Assessed the need for Mobilization I made an Assessment of medications to be adjusted accordingly Safety Risk assessment completed   CASE DISCUSSED IN MULTIDISCIPLINARY ROUNDS WITH ICU TEAM  Critical Care Time devoted to patient care services described in this note is 45 minutes.   Overall, patient is critically ill, prognosis is guarded.     Corrin Parker, M.D.  [facility]  Medical Director [place] Director [facility] Cardio-Pulmonary Department

## 2020-08-30 DIAGNOSIS — U071 COVID-19: Secondary | ICD-10-CM | POA: Diagnosis not present

## 2020-08-30 DIAGNOSIS — J9601 Acute respiratory failure with hypoxia: Secondary | ICD-10-CM | POA: Diagnosis not present

## 2020-08-30 LAB — RENAL FUNCTION PANEL
Albumin: 3 g/dL — ABNORMAL LOW (ref 3.5–5.0)
Anion gap: 14 (ref 5–15)
BUN: 29 mg/dL — ABNORMAL HIGH (ref 6–20)
CO2: 32 mmol/L (ref 22–32)
Calcium: 8.8 mg/dL — ABNORMAL LOW (ref 8.9–10.3)
Chloride: 88 mmol/L — ABNORMAL LOW (ref 98–111)
Creatinine, Ser: 0.55 mg/dL — ABNORMAL LOW (ref 0.61–1.24)
GFR, Estimated: >60 mL/min (ref >60)
Glucose, Bld: 342 mg/dL — ABNORMAL HIGH (ref 70–99)
Phosphorus: 3.9 mg/dL (ref 2.5–4.6)
Potassium: 4.4 mmol/L (ref 3.5–5.1)
Sodium: 134 mmol/L — ABNORMAL LOW (ref 135–145)

## 2020-08-30 LAB — CBC WITH DIFFERENTIAL/PLATELET
Abs Immature Granulocytes: 0.39 10*3/uL — ABNORMAL HIGH (ref 0.00–0.07)
Basophils Absolute: 0.1 10*3/uL (ref 0.0–0.1)
Basophils Relative: 0 %
Eosinophils Absolute: 0 10*3/uL (ref 0.0–0.5)
Eosinophils Relative: 0 %
HCT: 37.3 % — ABNORMAL LOW (ref 39.0–52.0)
Hemoglobin: 11.6 g/dL — ABNORMAL LOW (ref 13.0–17.0)
Immature Granulocytes: 1 %
Lymphocytes Relative: 2 %
Lymphs Abs: 0.4 10*3/uL — ABNORMAL LOW (ref 0.7–4.0)
MCH: 29.2 pg (ref 26.0–34.0)
MCHC: 31.1 g/dL (ref 30.0–36.0)
MCV: 94 fL (ref 80.0–100.0)
Monocytes Absolute: 0.7 10*3/uL (ref 0.1–1.0)
Monocytes Relative: 2 %
Neutro Abs: 27.2 10*3/uL — ABNORMAL HIGH (ref 1.7–7.7)
Neutrophils Relative %: 95 %
Platelets: 142 10*3/uL — ABNORMAL LOW (ref 150–400)
RBC: 3.97 MIL/uL — ABNORMAL LOW (ref 4.22–5.81)
RDW: 17.7 % — ABNORMAL HIGH (ref 11.5–15.5)
Smear Review: NORMAL
WBC Morphology: INCREASED
WBC: 28.7 10*3/uL — ABNORMAL HIGH (ref 4.0–10.5)
nRBC: 0 % (ref 0.0–0.2)

## 2020-08-30 LAB — GLUCOSE, CAPILLARY
Glucose-Capillary: 159 mg/dL — ABNORMAL HIGH (ref 70–99)
Glucose-Capillary: 203 mg/dL — ABNORMAL HIGH (ref 70–99)
Glucose-Capillary: 228 mg/dL — ABNORMAL HIGH (ref 70–99)
Glucose-Capillary: 298 mg/dL — ABNORMAL HIGH (ref 70–99)
Glucose-Capillary: 303 mg/dL — ABNORMAL HIGH (ref 70–99)
Glucose-Capillary: 316 mg/dL — ABNORMAL HIGH (ref 70–99)

## 2020-08-30 LAB — MAGNESIUM: Magnesium: 2.5 mg/dL — ABNORMAL HIGH (ref 1.7–2.4)

## 2020-08-30 LAB — TRIGLYCERIDES: Triglycerides: 540 mg/dL — ABNORMAL HIGH (ref <150)

## 2020-08-30 LAB — D-DIMER, QUANTITATIVE: D-Dimer, Quant: 1.69 ug/mL-FEU — ABNORMAL HIGH (ref 0.00–0.50)

## 2020-08-30 LAB — PHOSPHORUS: Phosphorus: 4.2 mg/dL (ref 2.5–4.6)

## 2020-08-30 LAB — FERRITIN: Ferritin: 543 ng/mL — ABNORMAL HIGH (ref 24–336)

## 2020-08-30 LAB — C-REACTIVE PROTEIN: CRP: 19.9 mg/dL — ABNORMAL HIGH (ref <1.0)

## 2020-08-30 NOTE — Progress Notes (Signed)
CRITICAL CARE NOTE  30 yo male recent hospitalization for perforated diverticulitis s/p colostomy with reversal and leakage of anastomosis with s/p revision now acutely hypoxemic with COVID19 induced severe ARDS.   INTERVAL EVENTS: 08/11/20-patient continued to decline with worsening respiratory status despite 100%Fio2 on BIPAP and HFNC failure. He stated that he feels he is dying and cannot breathe. I discussed with wife [name] earlier today that patient is critically ill may need ETT, she is agreeable and thankful for care. I specifically discussed and explained intubation and mechanical ventilation to patient and he wishes to proceed.  08/12/20- patient was weaned on FiO2 to 60%. Spoke to wife [name].  08/13/20- Patient weaned to 40%. During SBT today he self extubated and was placed on HFNC. He subsequently became hypoxemic, disoriented aggitated, removed PIVs and colostomy and proceeded to have combative behavior with worsening oxygenation. I have attempted to calm patient and encouraged him to cooperate. After numerous attempts patient continued to be combative with severe aggitation with combative behavior and would not wear oxygen. Patient was placed on sedation and MV. Wife updated.  08/14/20-patient remains critically ill. Weaned from 100%>>55%. Called and updated wife [name] today 12/27-Patient with worsening oxygenation on FiO2 100% peep of 14 08/16/20-Patient with continued high oxygen requirements overnight. Have spoken with surgery and there should be no problem proning patient with this. Was febrile yesterday and thus broad spectrum abx started and cultures attained.  08-17-20-patient down to PEEP 10 FiO2 55%. Patient never required proning 08/18/20:FiO2 weaned to 40%, PEEP 10, plan for diuresis with Diamox today, adding PO Clonazepam to assist weaning sedation 08/19/20: Vent changed to Pressure Control this morning: 50% FiO2, 24, 22/5, Plan for Recruitment maneuvers  and diuresis 08/20/2020:Continued issues with hypercapnia due to the dead space ventilation. Worsening despite pressure control. Switch back to [facility], prone positioning 08/23/19- patient proned today without incident, continue to wean FiO2 , currently on 50% 08/24/19-patient weaned to 40% FiO2, plan to continue proning with full scope of care. I have spoken to [name] (wife of patient today) she would like to be present prior to next extubation attempt to help console patient and decrease his aggitation. 1/71failed weaning trials, ver agitated , severe hypoxia, Wife at bedside witnessed failed SAT. 1/6 severe resp failure, hypoxia 1/7 proning for 16 hrs, severe ARDS 1/8 PICC line placed, Had a brief PEA arrest while prone. Proning discontinued 1/10 severe resp failure 1/11 severe ARDS    CC  follow up respiratory failure  SUBJECTIVE Patient remains critically ill Prognosis is guarded  Vent Mode: PRVC FiO2 (%):  [28 %-75 %] 60 % Set Rate:  [26 bmp] 26 bmp Vt Set:  [550 mL] 550 mL PEEP:  [5 cmH20] 5 cmH20  CBC    Component Value Date/Time   WBC 28.7 (H) 08/30/2020 0511   RBC 3.97 (L) 08/30/2020 0511   HGB 11.6 (L) 08/30/2020 0511   HCT 37.3 (L) 08/30/2020 0511   PLT 142 (L) 08/30/2020 0511   MCV 94.0 08/30/2020 0511   MCH 29.2 08/30/2020 0511   MCHC 31.1 08/30/2020 0511   RDW 17.7 (H) 08/30/2020 0511   LYMPHSABS 0.4 (L) 08/30/2020 0511   MONOABS 0.7 08/30/2020 0511   EOSABS 0.0 08/30/2020 0511   BASOSABS 0.1 08/30/2020 0511   BMP Latest Ref Rng & Units 08/30/2020 08/29/2020 08/28/2020  Glucose 70 - 99 mg/dL 342(H) 219(H) 219(H)  BUN 6 - 20 mg/dL 29(H) 37(H) 41(H)  Creatinine 0.61 - 1.24 mg/dL 0.55(L) 0.46(L) 0.47(L)  Sodium 135 -  145 mmol/L 134(L) 138 140  Potassium 3.5 - 5.1 mmol/L 4.4 4.2 4.4  Chloride 98 - 111 mmol/L 88(L) 92(L) 99  CO2 22 - 32 mmol/L 32 32 31  Calcium 8.9 - 10.3 mg/dL 8.8(L) 8.6(L) 9.0    BP 108/60 (BP Location: Left Wrist)   Pulse (!) 104    Temp 99.32 F (37.4 C) (Rectal)   Resp (!) 26   Ht 6' 0.01" (1.829 m)   Wt 108.2 kg   SpO2 100%   BMI 32.34 kg/m    I/O last 3 completed shifts: In: 4292.2 [I.V.:1592.2; NG/GT:2700] Out: 7106 [Urine:4300; Stool:650] Total I/O In: 332.1 [I.V.:57.1; NG/GT:275] Out: 875 [Urine:875]  SpO2: 100 % O2 Flow Rate (L/min): 50 L/min FiO2 (%): 60 %  Estimated body mass index is 32.34 kg/m as calculated from the following:   Height as of this encounter: 6' 0.01" (1.829 m).   Weight as of this encounter: 108.2 kg.  SIGNIFICANT EVENTS   REVIEW OF SYSTEMS  PATIENT IS UNABLE TO PROVIDE COMPLETE REVIEW OF SYSTEMS DUE TO SEVERE CRITICAL ILLNESS      COVID-19 DISASTER DECLARATION:   FULL CONTACT PHYSICAL EXAMINATION WAS NOT POSSIBLE DUE TO TREATMENT OF COVID-19  AND CONSERVATION OF PERSONAL PROTECTIVE EQUIPMENT, LIMITED EXAM FINDINGS INCLUDE-   PHYSICAL EXAMINATION:  GENERAL:critically ill appearing, +resp distress NEUROLOGIC: obtunded, GCS<8   Patient assessed or the symptoms described in the history of present illness.  In the context of the Global COVID-19 pandemic, which necessitated consideration that the patient might be at risk for infection with the SARS-CoV-2 virus that causes COVID-19, Institutional protocols and algorithms that pertain to the evaluation of patients at risk for COVID-19 are in a state of rapid change based on information released by regulatory bodies including the CDC and federal and state organizations. These policies and algorithms were followed during the patient's care while in hospital.    MEDICATIONS: I have reviewed all medications and confirmed regimen as documented   CULTURE RESULTS   No results found for this or any previous visit (from the past 240 hour(s)).        IMAGING    DG Abd 1 View  Result Date: 08/29/2020 CLINICAL DATA:  Line placement EXAM: ABDOMEN - 1 VIEW COMPARISON:  August 13, 2020 FINDINGS: The enteric tube projects  over the gastric body/fundus. The bowel gas pattern is nonobstructive where visualized. There are no definite radiopaque kidney stones. IMPRESSION: Enteric tube projects over the gastric body/fundus. Electronically Signed   By: Constance Holster M.D.   On: 08/29/2020 23:13   DG Chest Port 1 View  Result Date: 08/29/2020 CLINICAL DATA:  Acute respiratory failure EXAM: PORTABLE CHEST 1 VIEW COMPARISON:  August 29, 2020 FINDINGS: The endotracheal tube terminates above the carina. The right-sided PICC line is well position. The enteric tube extends below the left hemidiaphragm. There are diffuse bilateral coarse airspace opacities, similar to prior study. There is no pneumothorax. No definite large pleural effusion. IMPRESSION: No significant short interval change. Electronically Signed   By: Constance Holster M.D.   On: 08/29/2020 23:12     Nutrition Status: Nutrition Problem: Inadequate oral intake Etiology: inability to eat (pt sedated and ventilated) Signs/Symptoms: NPO status       Indwelling Urinary Catheter continued, requirement due to   Reason to continue Indwelling Urinary Catheter strict Intake/Output monitoring for hemodynamic instability   Central Line/ continued, requirement due to  Reason to continue [place] of central venous pressure or other hemodynamic parameters  and poor IV access   Ventilator continued, requirement due to severe respiratory failure   Ventilator Sedation RASS 0 to -2      ASSESSMENT AND PLAN SYNOPSIS    Acute Hypoxic Respiratory Failurein the setting of COVID-19 Pneumonia with severe ARDS perforated diverticulitis s/p colostomy with reversal and leakage of anastomosis with s/p revision now acutely hypoxemic with COVID19 induced severe ARDS,complicated by delirium and agitation ?ETOh withdrawals     Acute hypoxemic respiratory failure due to COVID-19 pneumonia SEVERE ARDS Mechanical ventilation via ARDS protocol, target  PRVC 6 cc/kg Wean PEEP and FiO2 as able Goal plateau pressure less than 30, driving pressure less than 15 Paralytics if necessary for vent synchrony, gas exchange Cycle prone positioning if necessary for oxygenation Deep sedation per PAD protocol,  Diuresis as blood pressure and renal function can tolerate, goal CVP 5-8.   diuresis as tolerated based on Kidney function VAP prevention order set Remdesivir  IV STEROIDS  Follow CRP   Severe ACUTE Hypoxic and Hypercapnic Respiratory Failure -continue Full MV support -continue Bronchodilator Therapy -Wean Fio2 and PEEP as tolerated -VAP/VENT bundle implementation  Morbid obesity, possible OSA.   Will certainly impact respiratory mechanics, ventilator weaning Suspect will need to consider additional PEEP   NEUROLOGY Acute toxic metabolic encephalopathy, need for sedation Goal RASS -2 to -3  CARDIAC ICU monitoring  ID -continue IV abx as prescibed -follow up cultures  GI GI PROPHYLAXIS as indicated   DIET-->TF's as tolerated Constipation protocol as indicated  ENDO - will use ICU hypoglycemic\Hyperglycemia protocol if indicated     ELECTROLYTES -follow labs as needed -replace as needed -pharmacy consultation and following   DVT/GI PRX ordered and assessed TRANSFUSIONS AS NEEDED MONITOR FSBS I Assessed the need for Labs I Assessed the need for Foley I Assessed the need for Central Venous Line Family Discussion when available I Assessed the need for Mobilization I made an Assessment of medications to be adjusted accordingly Safety Risk assessment completed   CASE DISCUSSED IN MULTIDISCIPLINARY ROUNDS WITH ICU TEAM  Critical Care Time devoted to patient care services described in this note is 45 minutes.   Overall, patient is critically ill, prognosis is guarded.  Patient with Multiorgan failure and at high risk for cardiac arrest and death.   Patient will need Trach for Survival  Corrin Parker, M.D.   [facility]  Medical Director [place] Director [facility] Cardio-Pulmonary Department

## 2020-08-30 NOTE — Progress Notes (Signed)
Inpatient Diabetes Program Recommendations  AACE/ADA: New Consensus Statement on Inpatient Glycemic Control (2015)  Target Ranges:  Prepandial:   less than 140 mg/dL      Peak postprandial:   less than 180 mg/dL (1-2 hours)      Critically ill patients:  140 - 180 mg/dL   Lab Results  Component Value Date   GLUCAP 298 (H) 08/30/2020   HGBA1C 5.7 (H) 08/10/2020    Review of Glycemic Control Results for Craig [name], Craig [name] (MRN [id number]) as of 08/30/2020 14:02  Ref. Range 08/29/2020 22:09 08/29/2020 23:03 08/30/2020 03:53 08/30/2020 07:50 08/30/2020 11:12  Glucose-Capillary Latest Ref Range: 70 - 99 mg/dL 146 (H) 109 (H) 303 (H) 316 (H) 298 (H)  Inpatient Diabetes Program Recommendations:   Noted holding off on IV insulin drip currently. Consider increase in in Novolog correction 3-9 units.  Thank you, Nani Gasser. Kimon Loewen, RN, MSN, CDE  Diabetes Coordinator Inpatient Glycemic Control Team Team Pager [phone] (8am-5pm) 08/30/2020 2:05 PM

## 2020-08-30 NOTE — Consult Note (Signed)
Favor, [place] [id number] 09-16-1990 Craig Lipps, MD  Reason for Consult: Failure to extubate  HPI: Noted and documented in the chart, patient with COVID infection requiring intubation unable to be extubated.  Allergies:  Allergies  Allergen Reactions  . Amoxicillin Rash    ROS: Review of systems normal other than 12 systems except per HPI.  PMH:  Past Medical History:  Diagnosis Date  . Gout   . Hypertension   . Rupture of bowel (HCC)     FH:  Family History  Problem Relation Age of Onset  . Healthy Mother   . Healthy Father     SH:  Social History   Socioeconomic History  . Marital status: Married    Spouse name: Not on file  . Number of children: Not on file  . Years of education: Not on file  . Highest education level: Not on file  Occupational History  . Not on file  Tobacco Use  . Smoking status: Never Smoker  . Smokeless tobacco: Current User    Types: Chew  Vaping Use  . Vaping Use: Never used  Substance and Sexual Activity  . Alcohol use: Yes    Comment: pint to a fifth per day per patient . Patient reports he hasn't had anything to drink in three weeks.  . Drug use: Never  . Sexual activity: Yes    Birth control/protection: None  Other Topics Concern  . Not on file  Social History Narrative  . Not on file   Social Determinants of Health   Financial Resource Strain: Not on file  Food Insecurity: Not on file  Transportation Needs: Not on file  Physical Activity: Not on file  Stress: Not on file  Social Connections: Not on file  Intimate Partner Violence: Not on file    PSH:  Past Surgical History:  Procedure Laterality Date  . COLONOSCOPY WITH PROPOFOL N/A 05/18/2020   Procedure: COLONOSCOPY WITH PROPOFOL;  Surgeon: Benjamine Sprague, DO;  Location: [facility];  Service: General;  Laterality: N/A;  . COLOSTOMY N/A 11/22/2019   Procedure: COLOSTOMY;  Surgeon: Herbert Pun, MD;  Location: [facility];  Service: General;   Laterality: N/A;  . LAPAROTOMY N/A 11/22/2019   Procedure: EXPLORATORY LAPAROTOMY;  Surgeon: Herbert Pun, MD;  Location: [facility];  Service: General;  Laterality: N/A;  . LAPAROTOMY N/A 07/08/2020   Procedure: EXPLORATORY LAPAROTOMY;  Surgeon: Herbert Pun, MD;  Location: [facility];  Service: General;  Laterality: N/A;  . PARTIAL COLECTOMY N/A 11/22/2019   Procedure: PARTIAL COLECTOMY;  Surgeon: Herbert Pun, MD;  Location: [facility];  Service: General;  Laterality: N/A;  . XI ROBOTIC ASSISTED COLOSTOMY TAKEDOWN N/A 06/28/2020   Procedure: XI ROBOTIC ASSISTED COLOSTOMY TAKEDOWN CONVERTED TO OPEN PROCEDURE;  Surgeon: Herbert Pun, MD;  Location: [facility];  Service: General;  Laterality: N/A;    Physical  Exam: Patient intubated and sedated multiple oral tubes in position anterior neck I could see no previous incision marks, mild to moderate obesity.   A/P: Failure to extubate, had long discussion with [name] about tracheostomy tube placement.  We discussed risk and benefits clean bleeding infection airway obstruction pneumothorax and death she is eager to proceed.  I will talk with scheduling tomorrow and likely plan for tracheostomy early next week.  ICU time approximately 45 minutes.   Craig [name] 08/30/2020 5:34 PM

## 2020-08-31 DIAGNOSIS — U071 COVID-19: Secondary | ICD-10-CM | POA: Diagnosis not present

## 2020-08-31 DIAGNOSIS — A419 Sepsis, unspecified organism: Secondary | ICD-10-CM | POA: Diagnosis not present

## 2020-08-31 DIAGNOSIS — J9601 Acute respiratory failure with hypoxia: Secondary | ICD-10-CM | POA: Diagnosis not present

## 2020-08-31 DIAGNOSIS — R6520 Severe sepsis without septic shock: Secondary | ICD-10-CM | POA: Diagnosis not present

## 2020-08-31 LAB — RENAL FUNCTION PANEL
Albumin: 3 g/dL — ABNORMAL LOW (ref 3.5–5.0)
Anion gap: 11 (ref 5–15)
BUN: 29 mg/dL — ABNORMAL HIGH (ref 6–20)
CO2: 35 mmol/L — ABNORMAL HIGH (ref 22–32)
Calcium: 8.9 mg/dL (ref 8.9–10.3)
Chloride: 90 mmol/L — ABNORMAL LOW (ref 98–111)
Creatinine, Ser: 0.43 mg/dL — ABNORMAL LOW (ref 0.61–1.24)
GFR, Estimated: >60 mL/min (ref >60)
Glucose, Bld: 235 mg/dL — ABNORMAL HIGH (ref 70–99)
Phosphorus: 2.9 mg/dL (ref 2.5–4.6)
Potassium: 3.7 mmol/L (ref 3.5–5.1)
Sodium: 136 mmol/L (ref 135–145)

## 2020-08-31 LAB — GLUCOSE, CAPILLARY
Glucose-Capillary: 246 mg/dL — ABNORMAL HIGH (ref 70–99)
Glucose-Capillary: 200 mg/dL — ABNORMAL HIGH (ref 70–99)
Glucose-Capillary: 233 mg/dL — ABNORMAL HIGH (ref 70–99)
Glucose-Capillary: 251 mg/dL — ABNORMAL HIGH (ref 70–99)
Glucose-Capillary: 222 mg/dL — ABNORMAL HIGH (ref 70–99)
Glucose-Capillary: 202 mg/dL — ABNORMAL HIGH (ref 70–99)

## 2020-08-31 LAB — CBC WITH DIFFERENTIAL/PLATELET
Abs Immature Granulocytes: 0.19 10*3/uL — ABNORMAL HIGH (ref 0.00–0.07)
Basophils Absolute: 0 10*3/uL (ref 0.0–0.1)
Basophils Relative: 0 %
Eosinophils Absolute: 0 10*3/uL (ref 0.0–0.5)
Eosinophils Relative: 0 %
HCT: 33.1 % — ABNORMAL LOW (ref 39.0–52.0)
Hemoglobin: 10.4 g/dL — ABNORMAL LOW (ref 13.0–17.0)
Immature Granulocytes: 1 %
Lymphocytes Relative: 1 %
Lymphs Abs: 0.3 10*3/uL — ABNORMAL LOW (ref 0.7–4.0)
MCH: 28.9 pg (ref 26.0–34.0)
MCHC: 31.4 g/dL (ref 30.0–36.0)
MCV: 91.9 fL (ref 80.0–100.0)
Monocytes Absolute: 0.5 10*3/uL (ref 0.1–1.0)
Monocytes Relative: 3 %
Neutro Abs: 18.4 10*3/uL — ABNORMAL HIGH (ref 1.7–7.7)
Neutrophils Relative %: 95 %
Platelets: 114 10*3/uL — ABNORMAL LOW (ref 150–400)
RBC: 3.6 MIL/uL — ABNORMAL LOW (ref 4.22–5.81)
RDW: 17.1 % — ABNORMAL HIGH (ref 11.5–15.5)
WBC: 19.4 10*3/uL — ABNORMAL HIGH (ref 4.0–10.5)
nRBC: 0 % (ref 0.0–0.2)

## 2020-08-31 LAB — TRIGLYCERIDES: Triglycerides: 335 mg/dL — ABNORMAL HIGH (ref <150)

## 2020-08-31 LAB — C-REACTIVE PROTEIN: CRP: 21.7 mg/dL — ABNORMAL HIGH (ref <1.0)

## 2020-08-31 LAB — MAGNESIUM: Magnesium: 2.3 mg/dL (ref 1.7–2.4)

## 2020-08-31 LAB — FERRITIN: Ferritin: 585 ng/mL — ABNORMAL HIGH (ref 24–336)

## 2020-08-31 LAB — D-DIMER, QUANTITATIVE: D-Dimer, Quant: 1.07 ug/mL-FEU — ABNORMAL HIGH (ref 0.00–0.50)

## 2020-08-31 MED ORDER — METHYLPREDNISOLONE SODIUM SUCC 40 MG IJ SOLR
40.0000 mg | Freq: Three times a day (TID) | INTRAMUSCULAR | Status: DC
  Administered 2020-08-31 – 2020-09-03 (×9): 40 mg via INTRAVENOUS
  Filled 2020-08-31 (×9): qty 1

## 2020-08-31 NOTE — TOC Progression Note (Signed)
Transition of Care [facility]) - Progression Note    Patient Details  Name: Craig [name] MRN: [id number] Date of Birth: 1991/08/04  Transition of Care [facility]) CM/SW [place], [place] Phone Number:  [phone] 08/31/2020, 3:38 PM  Clinical Narrative:     CSW spoke with patient's spouse Craig [name] [phone], about patient status. Ms. Craig [name] stated she was aware the patient was scheduled for a tracheostomy on Tuesday 09/06/2020.  CSW explained the TOC role in patient care and spoke with her about LTACH placement.  Ms. Craig [name] chose [facility] and asked this CSW to reach out to [facility].  CSW explained LTACH placement process and possible timeline. Ms. Craig [name] verbalized understanding.  CSW reached out to [employer] at [facility] with patient information for possible placement.          Expected Discharge Plan and Services                                                 Social Determinants of Health (SDOH) Interventions    Readmission Risk Interventions No flowsheet data found.

## 2020-08-31 NOTE — TOC Progression Note (Signed)
Transition of Care [facility]) - Initial/Assessment Note    Patient Details  Name: Craig [name] MRN: [id number] Date of Birth: 02-14-1991  [facility] CM/SW Contact:    Ova Freshwater Phone Number: [phone] 08/31/2020, 11:40 AM  Clinical Narrative:                  CSW left voicemail requesting call back for Breashears,[name] (Spouse) [phone] (Mobile), to discuss d/c planning and LTACH placement.       Patient Goals and CMS Choice        Expected Discharge Plan and Services                                                Prior Living Arrangements/Services                       Activities of Daily Living Home Assistive Devices/Equipment: None ADL Screening (condition at time of admission) Patient's cognitive ability adequate to safely complete daily activities?: No Is the patient deaf or have difficulty hearing?: No Does the patient have difficulty seeing, even when wearing glasses/contacts?: No Does the patient have difficulty concentrating, remembering, or making decisions?: No Patient able to express need for assistance with ADLs?: Yes Does the patient have difficulty dressing or bathing?: No Independently performs ADLs?: Yes (appropriate for developmental age) Does the patient have difficulty walking or climbing stairs?: No Weakness of Legs: None Weakness of Arms/Hands: None  Permission Sought/Granted                  Emotional Assessment              Admission diagnosis:  Acute respiratory failure with hypoxia ([place]) [J96.01] Severe sepsis ([place]) [A41.9, R65.20] Acute hypoxemic respiratory failure due to COVID-19 ([place]) [U07.1, J96.01] COVID-19 [U07.1] Patient Active Problem List   Diagnosis Date Noted  . Acute respiratory failure with hypoxia ([place])   . Acute hypoxemic respiratory failure due to COVID-19 ([place]) 08/09/2020  . Severe sepsis ([place]) 08/09/2020  . AKI (acute kidney injury) ([place])  08/09/2020  . Hypertension   . 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 Pneumoperitoneum 07/08/2020  . 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 Diverticulitis of colon with perforation 11/22/2019   PCP:  [name], MD Pharmacy:   [id number] - [place], [address] [place] [address] Phone: [phone] Fax: [phone]     Social Determinants of Health (SDOH) Interventions    Readmission Risk Interventions No flowsheet data found.

## 2020-08-31 NOTE — Progress Notes (Signed)
CRITICAL CARE NOTE 30 yo male recent hospitalization for perforated diverticulitis s/p colostomy with reversal and leakage of anastomosis with s/p revision now acutely hypoxemic with COVID19 induced severe ARDS.   INTERVAL EVENTS: 08/11/20-patient continued to decline with worsening respiratory status despite 100%Fio2 on BIPAP and HFNC failure. He stated that he feels he is dying and cannot breathe. I discussed with wife [name] earlier today that patient is critically ill may need ETT, she is agreeable and thankful for care. I specifically discussed and explained intubation and mechanical ventilation to patient and he wishes to proceed.  08/12/20- patient was weaned on FiO2 to 60%. Spoke to wife [name].  08/13/20- Patient weaned to 40%. During SBT today he self extubated and was placed on HFNC. He subsequently became hypoxemic, disoriented aggitated, removed PIVs and colostomy and proceeded to have combative behavior with worsening oxygenation. I have attempted to calm patient and encouraged him to cooperate. After numerous attempts patient continued to be combative with severe aggitation with combative behavior and would not wear oxygen. Patient was placed on sedation and MV. Wife updated.  08/14/20-patient remains critically ill. Weaned from 100%>>55%. Called and updated wife [name] today 12/27-Patient with worsening oxygenation on FiO2 100% peep of 14 08/16/20-Patient with continued high oxygen requirements overnight. Have spoken with surgery and there should be no problem proning patient with this. Was febrile yesterday and thus broad spectrum abx started and cultures attained.  08-17-20-patient down to PEEP 10 FiO2 55%. Patient never required proning 08/18/20:FiO2 weaned to 40%, PEEP 10, plan for diuresis with Diamox today, adding PO Clonazepam to assist weaning sedation 08/19/20: Vent changed to Pressure Control this morning: 50% FiO2, 24, 22/5, Plan for Recruitment maneuvers and  diuresis 08/20/2020:Continued issues with hypercapnia due to the dead space ventilation. Worsening despite pressure control. Switch back to [facility], prone positioning 08/23/19- patient proned today without incident, continue to wean FiO2 , currently on 50% 08/24/19-patient weaned to 40% FiO2, plan to continue proning with full scope of care. I have spoken to [name] (wife of patient today) she would like to be present prior to next extubation attempt to help console patient and decrease his aggitation. 1/15failed weaning trials, ver agitated , severe hypoxia, Wife at bedside witnessed failed SAT. 1/6 severe resp failure, hypoxia 1/7 proning for 16 hrs, severe ARDS 1/8 PICC line placed, Had a brief PEA arrest while prone. Proningdiscontinued 1/10 severe resp failure 1/11 severe ARDS, ENT consulted fro [facility]   CC  follow up respiratory failure  SUBJECTIVE Patient remains critically ill Prognosis is guarded   BP 114/64   Pulse (!) 104   Temp 98.6 F (37 C) (Oral)   Resp (!) 26   Ht 6' 0.01" (1.829 m)   Wt 108.2 kg   SpO2 96%   BMI 32.34 kg/m    I/O last 3 completed shifts: In: 4893.2 [I.V.:1673.2; NG/GT:3220] Out: 2376 [Urine:5090; Stool:600] No intake/output data recorded.  SpO2: 96 % O2 Flow Rate (L/min): 50 L/min FiO2 (%): 40 %  Estimated body mass index is 32.34 kg/m as calculated from the following:   Height as of this encounter: 6' 0.01" (1.829 m).   Weight as of this encounter: 108.2 kg.  SIGNIFICANT EVENTS   REVIEW OF SYSTEMS  PATIENT IS UNABLE TO PROVIDE COMPLETE REVIEW OF SYSTEMS DUE TO SEVERE CRITICAL ILLNESS        PHYSICAL EXAMINATION:  GENERAL:critically ill appearing, +resp distress HEAD: Normocephalic, atraumatic.  EYES: Pupils equal, round, reactive to light.  No scleral icterus.  MOUTH:  Moist mucosal membrane. NECK: Supple.  PULMONARY: +rhonchi, +wheezing CARDIOVASCULAR: S1 and S2. Regular rate and rhythm. No murmurs, rubs, or gallops.   GASTROINTESTINAL: Soft, nontender, -distended.  Positive bowel sounds.   MUSCULOSKELETAL: No swelling, clubbing, or edema.  NEUROLOGIC: obtunded, GCS<8 SKIN:intact,warm,dry  MEDICATIONS: I have reviewed all medications and confirmed regimen as documented     IMAGING    No results found.   Nutrition Status: Nutrition Problem: Inadequate oral intake Etiology: inability to eat (pt sedated and ventilated) Signs/Symptoms: NPO status       Indwelling Urinary Catheter continued, requirement due to   Reason to continue Indwelling Urinary Catheter strict Intake/Output monitoring for hemodynamic instability   Central Line/ continued, requirement due to  Reason to continue [place] of central venous pressure or other hemodynamic parameters and poor IV access   Ventilator continued, requirement due to severe respiratory failure   Ventilator Sedation RASS 0 to -2      ASSESSMENT AND PLAN SYNOPSIS  Acute Hypoxic Respiratory Failurein the setting of COVID-19 Pneumonia with severe ARDS perforated diverticulitis s/p colostomy with reversal and leakage of anastomosis with s/p revision now acutely hypoxemic with COVID19 induced severe ARDS,complicated by delirium and agitation Needs Trach for Survival CRP elevated-will increase steroids   Severe ACUTE Hypoxic and Hypercapnic Respiratory Failure -continue Full MV support -continue Bronchodilator Therapy -Wean Fio2 and PEEP as tolerated -VAP/VENT bundle implementation  Morbid obesity, possible OSA.   Will certainly impact respiratory mechanics, ventilator weaning Suspect will need to consider additional PEEP    NEUROLOGY Acute toxic metabolic encephalopathy, need for sedation Goal RASS -2 to -3 ?repeat MRI brain   SHOCK-SEPSIS/HYPOVOLUMIC/CARDIOGENIC -use vasopressors to keep MAP>65 -follow ABG and LA -follow up cultures -emperic ABX -consider stress dose steroids -aggressive IV fluid  resuscitation  CARDIAC ICU monitoring  ID -continue IV abx as prescibed -follow up cultures  GI GI PROPHYLAXIS as indicated  NUTRITIONAL STATUS Nutrition Status: Nutrition Problem: Inadequate oral intake Etiology: inability to eat (pt sedated and ventilated) Signs/Symptoms: NPO status     DIET-->TF's as tolerated Constipation protocol as indicated  ENDO - will use ICU hypoglycemic\Hyperglycemia protocol if indicated     ELECTROLYTES -follow labs as needed -replace as needed -pharmacy consultation and following   DVT/GI PRX ordered and assessed TRANSFUSIONS AS NEEDED MONITOR FSBS I Assessed the need for Labs I Assessed the need for Foley I Assessed the need for Central Venous Line Family Discussion when available I Assessed the need for Mobilization I made an Assessment of medications to be adjusted accordingly Safety Risk assessment completed   CASE DISCUSSED IN MULTIDISCIPLINARY ROUNDS WITH ICU TEAM  Critical Care Time devoted to patient care services described in this note is 45 minutes.   Overall, patient is critically ill, prognosis is guarded.  Patient with Multiorgan failure and at high risk for cardiac arrest and death.    Corrin Parker, M.D.  [facility]  Medical Director [place] Director [facility] Cardio-Pulmonary Department

## 2020-09-01 DIAGNOSIS — J9601 Acute respiratory failure with hypoxia: Secondary | ICD-10-CM | POA: Diagnosis not present

## 2020-09-01 DIAGNOSIS — N179 Acute kidney failure, unspecified: Secondary | ICD-10-CM | POA: Diagnosis not present

## 2020-09-01 DIAGNOSIS — U071 COVID-19: Secondary | ICD-10-CM | POA: Diagnosis not present

## 2020-09-01 LAB — RENAL FUNCTION PANEL
Albumin: 3.2 g/dL — ABNORMAL LOW (ref 3.5–5.0)
Anion gap: 14 (ref 5–15)
BUN: 33 mg/dL — ABNORMAL HIGH (ref 6–20)
CO2: 36 mmol/L — ABNORMAL HIGH (ref 22–32)
Calcium: 9.4 mg/dL (ref 8.9–10.3)
Chloride: 89 mmol/L — ABNORMAL LOW (ref 98–111)
Creatinine, Ser: 0.4 mg/dL — ABNORMAL LOW (ref 0.61–1.24)
GFR, Estimated: >60 mL/min (ref >60)
Glucose, Bld: 216 mg/dL — ABNORMAL HIGH (ref 70–99)
Phosphorus: 3.5 mg/dL (ref 2.5–4.6)
Potassium: 3.5 mmol/L (ref 3.5–5.1)
Sodium: 139 mmol/L (ref 135–145)

## 2020-09-01 LAB — CBC WITH DIFFERENTIAL/PLATELET
Abs Immature Granulocytes: 0.4 10*3/uL — ABNORMAL HIGH (ref 0.00–0.07)
Basophils Absolute: 0 10*3/uL (ref 0.0–0.1)
Basophils Relative: 0 %
Eosinophils Absolute: 0 10*3/uL (ref 0.0–0.5)
Eosinophils Relative: 0 %
HCT: 34.9 % — ABNORMAL LOW (ref 39.0–52.0)
Hemoglobin: 10.9 g/dL — ABNORMAL LOW (ref 13.0–17.0)
Immature Granulocytes: 2 %
Lymphocytes Relative: 2 %
Lymphs Abs: 0.4 10*3/uL — ABNORMAL LOW (ref 0.7–4.0)
MCH: 28.8 pg (ref 26.0–34.0)
MCHC: 31.2 g/dL (ref 30.0–36.0)
MCV: 92.3 fL (ref 80.0–100.0)
Monocytes Absolute: 0.7 10*3/uL (ref 0.1–1.0)
Monocytes Relative: 4 %
Neutro Abs: 16.7 10*3/uL — ABNORMAL HIGH (ref 1.7–7.7)
Neutrophils Relative %: 92 %
Platelets: 135 10*3/uL — ABNORMAL LOW (ref 150–400)
RBC: 3.78 MIL/uL — ABNORMAL LOW (ref 4.22–5.81)
RDW: 17 % — ABNORMAL HIGH (ref 11.5–15.5)
WBC: 18.2 10*3/uL — ABNORMAL HIGH (ref 4.0–10.5)
nRBC: 0.1 % (ref 0.0–0.2)

## 2020-09-01 LAB — PHOSPHORUS: Phosphorus: 3.4 mg/dL (ref 2.5–4.6)

## 2020-09-01 LAB — MAGNESIUM: Magnesium: 2.4 mg/dL (ref 1.7–2.4)

## 2020-09-01 LAB — GLUCOSE, CAPILLARY
Glucose-Capillary: 202 mg/dL — ABNORMAL HIGH (ref 70–99)
Glucose-Capillary: 159 mg/dL — ABNORMAL HIGH (ref 70–99)
Glucose-Capillary: 163 mg/dL — ABNORMAL HIGH (ref 70–99)
Glucose-Capillary: 267 mg/dL — ABNORMAL HIGH (ref 70–99)
Glucose-Capillary: 196 mg/dL — ABNORMAL HIGH (ref 70–99)
Glucose-Capillary: 218 mg/dL — ABNORMAL HIGH (ref 70–99)

## 2020-09-01 LAB — FERRITIN: Ferritin: 923 ng/mL — ABNORMAL HIGH (ref 24–336)

## 2020-09-01 LAB — FIBRIN DERIVATIVES D-DIMER (ARMC ONLY): Fibrin derivatives D-dimer (ARMC): 921.4 ng/mL (FEU) — ABNORMAL HIGH (ref 0.00–499.00)

## 2020-09-01 LAB — C-REACTIVE PROTEIN: CRP: 14.4 mg/dL — ABNORMAL HIGH (ref <1.0)

## 2020-09-01 LAB — TRIGLYCERIDES: Triglycerides: 280 mg/dL — ABNORMAL HIGH (ref <150)

## 2020-09-01 MED ORDER — PROSOURCE TF PO LIQD
45.0000 mL | Freq: Every day | ORAL | Status: DC
  Administered 2020-09-01 – 2020-09-08 (×26): 45 mL
  Filled 2020-09-01: qty 45

## 2020-09-01 MED ORDER — VECURONIUM BROMIDE 10 MG IV SOLR
20.0000 mg | Freq: Once | INTRAVENOUS | Status: AC
  Administered 2020-09-01: 20 mg via INTRAVENOUS

## 2020-09-01 MED ORDER — VITAL 1.5 CAL PO LIQD
1000.0000 mL | ORAL | Status: DC
  Administered 2020-09-01 – 2020-09-05 (×6): 1000 mL

## 2020-09-01 NOTE — TOC Progression Note (Signed)
Transition of Care [facility]) - Progression Note    Patient Details  Name: Craig [name] MRN: [id number] Date of Birth: Jul 06, 1991  Transition of Care [facility]) CM/SW [place], [place] Phone Number:  [phone] 09/01/2020, 2:12 PM  Clinical Narrative:     CSW gave patient information to Wells Guiles, RN for [facility], for possible placement.       Expected Discharge Plan and Services                                                 Social Determinants of Health (SDOH) Interventions    Readmission Risk Interventions No flowsheet data found.

## 2020-09-01 NOTE — Progress Notes (Signed)
1000 Oxygen saturation dropped to 83%. Suctioned multiple times and lavaged with normal saline. Copious amounts of yellow thick sputum. No change in oxygen saturation. FIO2 increased to 70%. SPO2 sensor changed. After several more suctionings oxygen saturations up to 93%

## 2020-09-01 NOTE — Progress Notes (Addendum)
Better afternoon. Wife called and updated. Mother called but no answer. Rested quietly all afternoon. Prevalon boots applied to prevent foot drop. SCDs also applied. Tube feeding decreased to 65 ml/hr per order.

## 2020-09-01 NOTE — Progress Notes (Signed)
Nutrition Follow-up  DOCUMENTATION CODES:   Obesity unspecified  INTERVENTION:  Initiate new goal TF regimen of Vital 1.5 Cal at 65 mL/hr (1560 mL goal daily volume) + PROSource 45 mL 5 times daily per tube. Provides 2540 kcal, 160 grams of protein, 1186 mL H2O daily. With current propofol rate provides 3250 kcal daily.  NUTRITION DIAGNOSIS:   Inadequate oral intake related to inability to eat (pt sedated and ventilated) as evidenced by NPO status.  Ongoing.  GOAL:   Provide needs based on ASPEN/SCCM guidelines  Met with TF regimen.  MONITOR:   Vent status,Labs,Weight trends,TF tolerance,Skin,I & O's  REASON FOR ASSESSMENT:   Ventilator    ASSESSMENT:   30 yo male with h/o Hartmann's for perforated diverticulitis 11/22/19, s/p takedown, parastomal hernia removal and resection of 26cm small bowel 66/2/94 complicated by leakage of anastomosis s/p revision 07/08/20 now admitted with COVID19 induced severe ARDS. Pt developed worsening respiratory failure 12/23 requiring intubation  Patient is currently intubated on ventilator support MV: 14 L/min Temp (24hrs), Avg:98.1 F (36.7 C), Min:97 F (36.1 C), Max:98.7 F (37.1 C)  Propofol: 26.9 ml/hr (710 kcal daily)  Medications reviewed and include: allopurinol, famotidine, free water 200 mL Q4hrs, Lasix 40 mg BID IV, Novolog 14 units Q4hrs, Novolog 2-6 units Q4hrs, Levemir 40 units Q12hrs, Solu-Medrol 40 mg Q8hrs IV, Dilaudid gtt, Versed gtt, propofol gtt.  Labs reviewed: CBG 159-202, Chloride 89, CO2 36, BUN 33, Creatinine 0.4.  I/O: 3025 mL UOP yesterday (1.2 mL/kg/hr)  Weight trend: 108.2 kg on 1/12; -9.7 kg from 12/21 but unsure if this wt was accurate  Enteral Access: OGT placed 12/23; terminates in gastric body/fundus per abdominal x-ray 1/10  Discussed with RN and on rounds. Patient tolerating TF regimen.  Diet Order:   Diet Order            Diet NPO time specified  Diet effective now                 EDUCATION NEEDS:   Not appropriate for education at this time  Skin:  Skin Assessment: Reviewed RN Assessment  Last BM:  09/02/2019 - small type 7 per ostomy  Height:   Ht Readings from Last 1 Encounters:  08/31/20 6' 0.01" (1.829 m)   Weight:   Wt Readings from Last 1 Encounters:  08/31/20 108.2 kg   Ideal Body Weight:  80.9 kg  BMI:  Body mass index is 32.34 kg/m.  Estimated Nutritional Needs:   Kcal:  2700-3000kcal/day  Protein:  161g/day  Fluid:  2.4-2.7L/day  Jacklynn Barnacle, MS, RD, LDN Pager number available on Amion

## 2020-09-01 NOTE — Progress Notes (Signed)
CRITICAL CARE NOTE 30 yo male recent hospitalization for perforated diverticulitis s/p colostomy with reversal and leakage of anastomosis with s/p revision now acutely hypoxemic with COVID19 induced severe ARDS.   INTERVAL EVENTS: 08/11/20-patient continued to decline with worsening respiratory status despite 100%Fio2 on BIPAP and HFNC failure. He stated that he feels he is dying and cannot breathe. I discussed with wife [name] earlier today that patient is critically ill may need ETT, she is agreeable and thankful for care. I specifically discussed and explained intubation and mechanical ventilation to patient and he wishes to proceed.  08/12/20- patient was weaned on FiO2 to 60%. Spoke to wife [name].  08/13/20- Patient weaned to 40%. During SBT today he self extubated and was placed on HFNC. He subsequently became hypoxemic, disoriented aggitated, removed PIVs and colostomy and proceeded to have combative behavior with worsening oxygenation. I have attempted to calm patient and encouraged him to cooperate. After numerous attempts patient continued to be combative with severe aggitation with combative behavior and would not wear oxygen. Patient was placed on sedation and MV. Wife updated.  08/14/20-patient remains critically ill. Weaned from 100%>>55%. Called and updated wife [name] today 12/27-Patient with worsening oxygenation on FiO2 100% peep of 14 08/16/20-Patient with continued high oxygen requirements overnight. Have spoken with surgery and there should be no problem proning patient with this. Was febrile yesterday and thus broad spectrum abx started and cultures attained.  08-17-20-patient down to PEEP 10 FiO2 55%. Patient never required proning 08/18/20:FiO2 weaned to 40%, PEEP 10, plan for diuresis with Diamox today, adding PO Clonazepam to assist weaning sedation 08/19/20: Vent changed to Pressure Control this morning: 50% FiO2, 24, 22/5, Plan for Recruitment maneuvers and  diuresis 08/20/2020:Continued issues with hypercapnia due to the dead space ventilation. Worsening despite pressure control. Switch back to [facility], prone positioning 08/23/19- patient proned today without incident, continue to wean FiO2 , currently on 50% 08/24/19-patient weaned to 40% FiO2, plan to continue proning with full scope of care. I have spoken to [name] (wife of patient today) she would like to be present prior to next extubation attempt to help console patient and decrease his aggitation. 1/92failed weaning trials, ver agitated , severe hypoxia, Wife at bedside witnessed failed SAT. 1/6 severe resp failure, hypoxia 1/7 proning for 16 hrs, severe ARDS 1/8 PICC line placed, Had a brief PEA arrest while prone. Proningdiscontinued 1/10severe resp failure 1/11severe ARDS, ENT consulted fro [facility] 1/12 severe resp failure   CC  follow up respiratory failure  SUBJECTIVE Patient remains critically ill Prognosis is guarded   BP 117/63   Pulse (!) 104   Temp (!) 97 F (36.1 C) (Axillary)   Resp (!) 26   Ht 6' 0.01" (1.829 m)   Wt 108.2 kg   SpO2 93%   BMI 32.34 kg/m    I/O last 3 completed shifts: In: 6055.5 [I.V.:1428; NG/GT:4627.5] Out: 6115 [Urine:5565; Stool:550] Total I/O In: -  Out: 650 [Urine:650]  SpO2: 93 % O2 Flow Rate (L/min): 50 L/min FiO2 (%): 60 %  Estimated body mass index is 32.34 kg/m as calculated from the following:   Height as of this encounter: 6' 0.01" (1.829 m).   Weight as of this encounter: 108.2 kg.  SIGNIFICANT EVENTS   REVIEW OF SYSTEMS  PATIENT IS UNABLE TO PROVIDE COMPLETE REVIEW OF SYSTEMS DUE TO SEVERE CRITICAL ILLNESS      COVID-19 DISASTER DECLARATION:   FULL CONTACT PHYSICAL EXAMINATION WAS NOT POSSIBLE DUE TO TREATMENT OF COVID-19  AND CONSERVATION  OF PERSONAL PROTECTIVE EQUIPMENT, LIMITED EXAM FINDINGS INCLUDE-   PHYSICAL EXAMINATION:  GENERAL:critically ill appearing, +resp distress HEAD: Normocephalic,  atraumatic.  EYES: Pupils equal, round, reactive to light.  No scleral icterus.  MOUTH: Moist mucosal membrane. NECK: Supple. No thyromegaly. No nodules. No JVD.  PULMONARY: +rhonchi, +wheezing CARDIOVASCULAR: S1 and S2. Regular rate and rhythm. No murmurs, rubs, or gallops.  GASTROINTESTINAL: Soft, nontender, -distended. Positive bowel sounds.  MUSCULOSKELETAL: No swelling, clubbing, or edema.  NEUROLOGIC: obtunded SKIN:intact,warm,dry     Patient assessed or the symptoms described in the history of present illness.  In the context of the Global COVID-19 pandemic, which necessitated consideration that the patient might be at risk for infection with the SARS-CoV-2 virus that causes COVID-19, Institutional protocols and algorithms that pertain to the evaluation of patients at risk for COVID-19 are in a state of rapid change based on information released by regulatory bodies including the CDC and federal and state organizations. These policies and algorithms were followed during the patient's care while in hospital.    MEDICATIONS: I have reviewed all medications and confirmed regimen as documented  Nutrition Status: Nutrition Problem: Inadequate oral intake Etiology: inability to eat (pt sedated and ventilated) Signs/Symptoms: NPO status       Indwelling Urinary Catheter continued, requirement due to   Reason to continue Indwelling Urinary Catheter strict Intake/Output monitoring for hemodynamic instability   Central Line/ continued, requirement due to  Reason to continue [place] of central venous pressure or other hemodynamic parameters and poor IV access   Ventilator continued, requirement due to severe respiratory failure   Ventilator Sedation RASS 0 to -2      ASSESSMENT AND PLAN SYNOPSIS  Acute Hypoxic Respiratory Failurein the setting of COVID-19 Pneumonia with severe ARDS perforated diverticulitis s/p colostomy with reversal and leakage of  anastomosis with s/p revision now acutely hypoxemic with COVID19 induced severe ARDS,complicated by delirium and agitation Needs Trach for Survival CRP elevated-will increase steroids  Severe ACUTE Hypoxic and Hypercapnic Respiratory Failure -continue Full MV support -continue Bronchodilator Therapy -Wean Fio2 and PEEP as tolerated -VAP/VENT bundle implementation  ACUTE DIASTOLIC CARDIAC FAILURE- -oxygen as needed -Lasix as tolerated  Morbid obesity, possible OSA.   Will certainly impact respiratory mechanics, ventilator weaning Suspect will need to consider additional PEEP   NEUROLOGy Acute toxic metabolic encephalopathy, need for sedation Goal RASS -2 to -3  CARDIAC ICU monitoring   GI GI PROPHYLAXIS as indicated   DIET-->TF's as tolerated Constipation protocol as indicated  ENDO - will use ICU hypoglycemic\Hyperglycemia protocol if indicated     ELECTROLYTES -follow labs as needed -replace as needed -pharmacy consultation and following   DVT/GI PRX ordered and assessed TRANSFUSIONS AS NEEDED MONITOR FSBS I Assessed the need for Labs I Assessed the need for Foley I Assessed the need for Central Venous Line Family Discussion when available I Assessed the need for Mobilization I made an Assessment of medications to be adjusted accordingly Safety Risk assessment completed   CASE DISCUSSED IN MULTIDISCIPLINARY ROUNDS WITH ICU TEAM  Critical Care Time devoted to patient care services described in this note is 45  minutes.   Overall, patient is critically ill, prognosis is guarded.  Patient with Multiorgan failure and at high risk for cardiac arrest and death.   ENT consulted for [facility] Patricia Pesa, M.D.  [facility] Pulmonary & Critical Care Medicine  Medical Director [place] Director [facility] Cardio-Pulmonary Department

## 2020-09-02 ENCOUNTER — Inpatient Hospital Stay: Payer: No Typology Code available for payment source

## 2020-09-02 LAB — RENAL FUNCTION PANEL
Albumin: 2.8 g/dL — ABNORMAL LOW (ref 3.5–5.0)
Anion gap: 13 (ref 5–15)
BUN: 38 mg/dL — ABNORMAL HIGH (ref 6–20)
CO2: 36 mmol/L — ABNORMAL HIGH (ref 22–32)
Calcium: 9.1 mg/dL (ref 8.9–10.3)
Chloride: 91 mmol/L — ABNORMAL LOW (ref 98–111)
Creatinine, Ser: 0.42 mg/dL — ABNORMAL LOW (ref 0.61–1.24)
GFR, Estimated: >60 mL/min (ref >60)
Glucose, Bld: 222 mg/dL — ABNORMAL HIGH (ref 70–99)
Phosphorus: 4 mg/dL (ref 2.5–4.6)
Potassium: 3.9 mmol/L (ref 3.5–5.1)
Sodium: 140 mmol/L (ref 135–145)

## 2020-09-02 LAB — MAGNESIUM: Magnesium: 2.5 mg/dL — ABNORMAL HIGH (ref 1.7–2.4)

## 2020-09-02 LAB — BLOOD GAS, ARTERIAL
Acid-Base Excess: 20.6 mmol/L — ABNORMAL HIGH (ref 0.0–2.0)
Bicarbonate: 45.5 mmol/L — ABNORMAL HIGH (ref 20.0–28.0)
FIO2: 0.5
MECHVT: 550 mL
O2 Saturation: 94.4 %
PEEP: 5 cmH2O
Patient temperature: 37
RATE: 26 resp/min
pCO2 arterial: 52 mmHg — ABNORMAL HIGH (ref 32.0–48.0)
pH, Arterial: 7.55 — ABNORMAL HIGH (ref 7.350–7.450)
pO2, Arterial: 63 mmHg — ABNORMAL LOW (ref 83.0–108.0)

## 2020-09-02 LAB — CBC WITH DIFFERENTIAL/PLATELET
Abs Immature Granulocytes: 0.74 10*3/uL — ABNORMAL HIGH (ref 0.00–0.07)
Basophils Absolute: 0 10*3/uL (ref 0.0–0.1)
Basophils Relative: 0 %
Eosinophils Absolute: 0 10*3/uL (ref 0.0–0.5)
Eosinophils Relative: 0 %
HCT: 34.1 % — ABNORMAL LOW (ref 39.0–52.0)
Hemoglobin: 10.9 g/dL — ABNORMAL LOW (ref 13.0–17.0)
Immature Granulocytes: 5 %
Lymphocytes Relative: 4 %
Lymphs Abs: 0.6 10*3/uL — ABNORMAL LOW (ref 0.7–4.0)
MCH: 29.4 pg (ref 26.0–34.0)
MCHC: 32 g/dL (ref 30.0–36.0)
MCV: 91.9 fL (ref 80.0–100.0)
Monocytes Absolute: 0.8 10*3/uL (ref 0.1–1.0)
Monocytes Relative: 6 %
Neutro Abs: 12.6 10*3/uL — ABNORMAL HIGH (ref 1.7–7.7)
Neutrophils Relative %: 85 %
Platelets: 148 10*3/uL — ABNORMAL LOW (ref 150–400)
RBC: 3.71 MIL/uL — ABNORMAL LOW (ref 4.22–5.81)
RDW: 16.5 % — ABNORMAL HIGH (ref 11.5–15.5)
WBC: 14.8 10*3/uL — ABNORMAL HIGH (ref 4.0–10.5)
nRBC: 0.2 % (ref 0.0–0.2)

## 2020-09-02 LAB — GLUCOSE, CAPILLARY
Glucose-Capillary: 200 mg/dL — ABNORMAL HIGH (ref 70–99)
Glucose-Capillary: 204 mg/dL — ABNORMAL HIGH (ref 70–99)
Glucose-Capillary: 189 mg/dL — ABNORMAL HIGH (ref 70–99)
Glucose-Capillary: 209 mg/dL — ABNORMAL HIGH (ref 70–99)
Glucose-Capillary: 173 mg/dL — ABNORMAL HIGH (ref 70–99)
Glucose-Capillary: 177 mg/dL — ABNORMAL HIGH (ref 70–99)
Glucose-Capillary: 210 mg/dL — ABNORMAL HIGH (ref 70–99)

## 2020-09-02 LAB — PHOSPHORUS: Phosphorus: 4.2 mg/dL (ref 2.5–4.6)

## 2020-09-02 LAB — TRIGLYCERIDES: Triglycerides: 286 mg/dL — ABNORMAL HIGH (ref <150)

## 2020-09-02 LAB — FIBRIN DERIVATIVES D-DIMER (ARMC ONLY): Fibrin derivatives D-dimer (ARMC): 1149.77 ng/mL (FEU) — ABNORMAL HIGH (ref 0.00–499.00)

## 2020-09-02 LAB — FERRITIN: Ferritin: 668 ng/mL — ABNORMAL HIGH (ref 24–336)

## 2020-09-02 LAB — C-REACTIVE PROTEIN: CRP: 8.4 mg/dL — ABNORMAL HIGH (ref <1.0)

## 2020-09-02 IMAGING — DX DG CHEST 1V PORT
1 series · 1 of 1 positions shown · non-contrast
Comparison: Portable chest [DATE] and earlier.

CLINICAL DATA: 29-year-old male with respiratory failure. [MR].
Sepsis.

EXAM:
PORTABLE CHEST 1 VIEW

[chest ap]
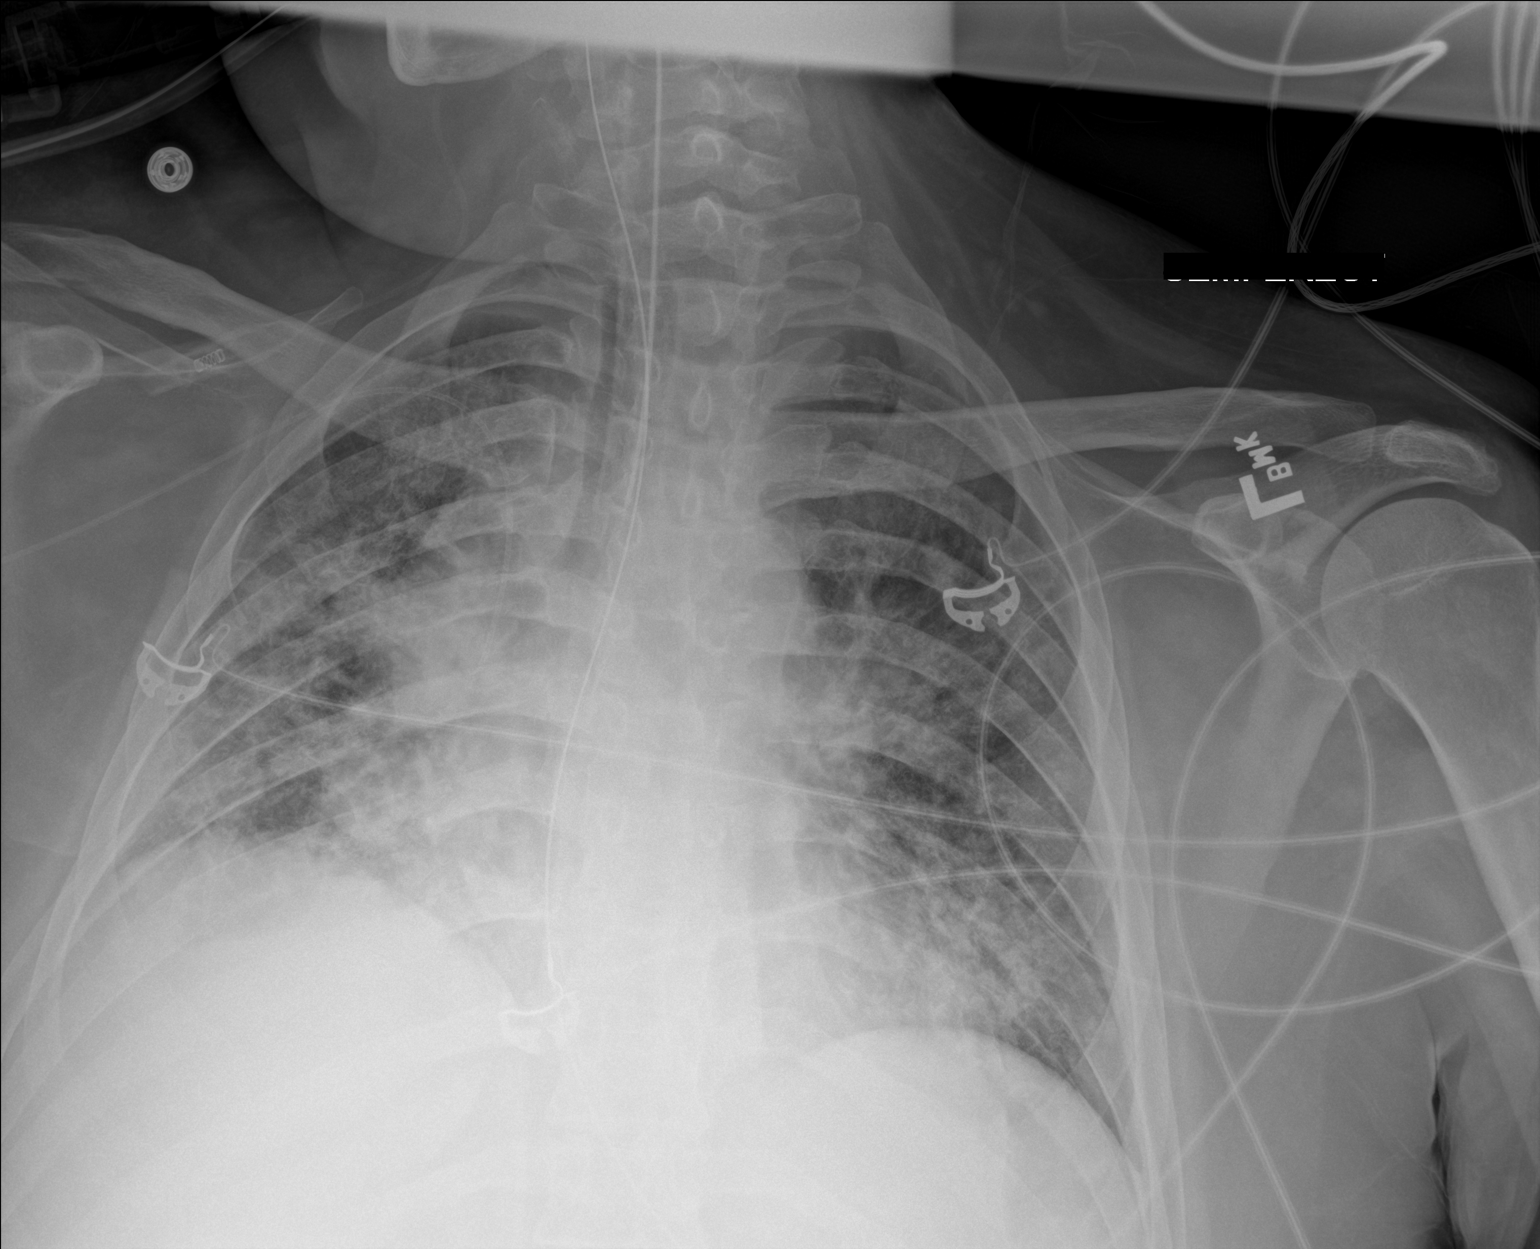

[1 of 1 positions shown; findings below may reference images not displayed]

FINDINGS: Portable AP semi upright view at [MR] hours. Stable endotracheal
tube, visible enteric tube and right PICC line. Lower lung volumes.
Stable cardiac size and mediastinal contours. Widespread coarse
bilateral interstitial opacity with patchy perihilar and basilar
confluence. Allowing for lower lung volumes ventilation has not
significantly changed since [DATE]. No pneumothorax or pleural
effusion. No acute osseous abnormality identified.
IMPRESSION: 1.  Stable lines and tubes.
2. Bilateral pneumonia. Ventilation not significantly changed since
[DATE] when allowing for lower lung volumes today.

## 2020-09-02 MED ORDER — INSULIN DETEMIR 100 UNIT/ML ~~LOC~~ SOLN
45.0000 [IU] | Freq: Two times a day (BID) | SUBCUTANEOUS | Status: DC
  Administered 2020-09-02 – 2020-09-05 (×7): 45 [IU] via SUBCUTANEOUS
  Filled 2020-09-02 (×9): qty 0.45

## 2020-09-02 MED ORDER — INSULIN ASPART 100 UNIT/ML ~~LOC~~ SOLN
16.0000 [IU] | SUBCUTANEOUS | Status: DC
Start: 2020-09-02 — End: 2020-09-06
  Administered 2020-09-02 – 2020-09-04 (×13): 16 [IU] via SUBCUTANEOUS
  Administered 2020-09-06: 6 [IU] via SUBCUTANEOUS
  Filled 2020-09-02 (×13): qty 1

## 2020-09-02 NOTE — Progress Notes (Signed)
Inpatient Diabetes Program Recommendations  AACE/ADA: New Consensus Statement on Inpatient Glycemic Control   Target Ranges:  Prepandial:   less than 140 mg/dL      Peak postprandial:   less than 180 mg/dL (1-2 hours)      Critically ill patients:  140 - 180 mg/dL   Results for REISE, GLADNEY (MRN [id number]) as of 09/02/2020 12:46  Ref. Range 09/02/2020 03:17 09/02/2020 03:44 09/02/2020 07:51 09/02/2020 11:20  Glucose-Capillary Latest Ref Range: 70 - 99 mg/dL 200 (H) 204 (H) 189 (H) 209 (H)  Results for TIGE, MEAS (MRN [id number]) as of 09/02/2020 12:46  Ref. Range 09/01/2020 07:40 09/01/2020 11:16 09/01/2020 16:05 09/01/2020 19:46 09/01/2020 23:18  Glucose-Capillary Latest Ref Range: 70 - 99 mg/dL 159 (H) 163 (H) 267 (H) 196 (H) 218 (H)   Review of Glycemic Control  Current orders for Inpatient glycemic control: Lantus 40 units Q12H, Novolog 14 units Q4H, Novolog 2-6 units Q4H; Solumedrol 40 mg Q8H, Vital @ 65 ml/hr  Inpatient Diabetes Program Recommendations:    Insulin: If steroids are continued as ordered, please consider increasing Lantus to 45 units Q12H and tube feeding coverage to Novolog 16 units Q4H.  Thanks, Barnie Alderman, RN, MSN, CDE Diabetes Coordinator Inpatient Diabetes Program [phone] (Team Pager from 8am to 5pm)

## 2020-09-02 NOTE — Progress Notes (Signed)
CRITICAL CARE NOTE   30 yo male recent hospitalization for perforated diverticulitis s/p colostomy with reversal and leakage of anastomosis with s/p revision now acutely hypoxemic with COVID19 induced severe ARDS.   INTERVAL EVENTS: 08/11/20-patient continued to decline with worsening respiratory status despite 100%Fio2 on BIPAP and HFNC failure. He stated that he feels he is dying and cannot breathe. I discussed with wife [name] earlier today that patient is critically ill may need ETT, she is agreeable and thankful for care. I specifically discussed and explained intubation and mechanical ventilation to patient and he wishes to proceed.  08/12/20- patient was weaned on FiO2 to 60%. Spoke to wife [name].  08/13/20- Patient weaned to 40%. During SBT today he self extubated and was placed on HFNC. He subsequently became hypoxemic, disoriented aggitated, removed PIVs and colostomy and proceeded to have combative behavior with worsening oxygenation. I have attempted to calm patient and encouraged him to cooperate. After numerous attempts patient continued to be combative with severe aggitation with combative behavior and would not wear oxygen. Patient was placed on sedation and MV. Wife updated.  08/14/20-patient remains critically ill. Weaned from 100%>>55%. Called and updated wife [name] today 12/27-Patient with worsening oxygenation on FiO2 100% peep of 14 08/16/20-Patient with continued high oxygen requirements overnight. Have spoken with surgery and there should be no problem proning patient with this. Was febrile yesterday and thus broad spectrum abx started and cultures attained.  08-17-20-patient down to PEEP 10 FiO2 55%. Patient never required proning 08/18/20:FiO2 weaned to 40%, PEEP 10, plan for diuresis with Diamox today, adding PO Clonazepam to assist weaning sedation 08/19/20: Vent changed to Pressure Control this morning: 50% FiO2, 24, 22/5, Plan for Recruitment maneuvers  and diuresis 08/20/2020:Continued issues with hypercapnia due to the dead space ventilation. Worsening despite pressure control. Switch back to [facility], prone positioning 08/23/19- patient proned today without incident, continue to wean FiO2 , currently on 50% 08/24/19-patient weaned to 40% FiO2, plan to continue proning with full scope of care. I have spoken to [name] (wife of patient today) she would like to be present prior to next extubation attempt to help console patient and decrease his aggitation. 1/4failed weaning trials, ver agitated , severe hypoxia, Wife at bedside witnessed failed SAT. 1/6 severe resp failure, hypoxia 1/7 proning for 16 hrs, severe ARDS 1/8 PICC line placed, Had a brief PEA arrest while prone. Proningdiscontinued 1/10severe resp failure 1/11severe ARDS, ENT consulted fro [facility] 1/12 severe resp failure 09/02/20- patient with no acute events.  Spoke with [name] today answered questions and plan is for trache.    CC  follow up respiratory failure  SUBJECTIVE Patient remains critically ill Prognosis is guarded   BP 112/65   Pulse 64   Temp 99.7 F (37.6 C) (Axillary)   Resp 20   Ht 6' 0.01" (1.829 m)   Wt 103.4 kg   SpO2 99%   BMI 30.91 kg/m    I/O last 3 completed shifts: In: 1472.4 [I.V.:1272.4; NG/GT:200] Out: 3295 [Urine:3800; Emesis/NG output:350; Stool:525] Total I/O In: 20 [I.V.:20] Out: -   SpO2: 99 % O2 Flow Rate (L/min): 50 L/min FiO2 (%): 45 %  Estimated body mass index is 30.91 kg/m as calculated from the following:   Height as of this encounter: 6' 0.01" (1.829 m).   Weight as of this encounter: 103.4 kg.  SIGNIFICANT EVENTS   REVIEW OF SYSTEMS  PATIENT IS UNABLE TO PROVIDE COMPLETE REVIEW OF SYSTEMS DUE TO SEVERE CRITICAL ILLNESS  COVID-19 DISASTER DECLARATION:     PHYSICAL EXAMINATION:  GENERAL:critically ill appearing, +resp distress HEAD: Normocephalic, atraumatic.  EYES: Pupils equal, round,  reactive to light.  No scleral icterus.  MOUTH: Moist mucosal membrane. NECK: Supple. No thyromegaly. No nodules. No JVD.  PULMONARY: +rhonchi,  CARDIOVASCULAR: S1 and S2. Regular rate and rhythm. No murmurs, rubs, or gallops.  GASTROINTESTINAL: Soft, nontender, -distended. Positive bowel sounds.  MUSCULOSKELETAL: No swelling, clubbing, or edema.  NEUROLOGIC: obtunded GCS4T SKIN:intact,warm,dry   MEDICATIONS: I have reviewed all medications and confirmed regimen as documented  Nutrition Status: Nutrition Problem: Inadequate oral intake Etiology: inability to eat (pt sedated and ventilated) Signs/Symptoms: NPO status       Indwelling Urinary Catheter continued, requirement due to   Reason to continue Indwelling Urinary Catheter strict Intake/Output monitoring for hemodynamic instability   Central Line/ continued, requirement due to  Reason to continue [place] of central venous pressure or other hemodynamic parameters and poor IV access   Ventilator continued, requirement due to severe respiratory failure   Ventilator Sedation RASS 0 to -2      ASSESSMENT AND PLAN SYNOPSIS  Acute Hypoxic Respiratory Failurein the setting of COVID-19 Pneumonia with severe ARDS perforated diverticulitis s/p colostomy with reversal and leakage of anastomosis with s/p revision now acutely hypoxemic with COVID19 induced severe ARDS,complicated by delirium and agitation Needs Trach for Survival CRP elevated-will increase steroids -continue Full MV support -continue Bronchodilator Therapy -Wean Fio2 and PEEP as tolerated -VAP/VENT bundle implementation  ACUTE DIASTOLIC CARDIAC FAILURE- -oxygen as needed -Lasix as tolerated  Morbid obesity, possible OSA.   Will certainly impact respiratory mechanics, ventilator weaning Suspect will need to consider additional PEEP    AMS with ncephalopathy Due to [place] and toxic metabolic encephalopathy - partially due to  sedation Goal RASS -2 to -3   Perforated diverticulitis   - patent s/p colostomy     - surgery on case    CARDIAC ICU monitoring   GI GI PROPHYLAXIS as indicated   DIET-->TF's as tolerated Constipation protocol as indicated  ENDO - will use ICU hypoglycemic\Hyperglycemia protocol if indicated     ELECTROLYTES -follow labs as needed -replace as needed -pharmacy consultation and following   DVT/GI PRX ordered and assessed TRANSFUSIONS AS NEEDED MONITOR FSBS I Assessed the need for Labs I Assessed the need for Foley I Assessed the need for Central Venous Line Family Discussion when available I Assessed the need for Mobilization I made an Assessment of medications to be adjusted accordingly Safety Risk assessment completed   CASE DISCUSSED IN MULTIDISCIPLINARY ROUNDS WITH ICU TEAM  Critical Care Time devoted to patient care services described in this note is 31  minutes.   Overall, patient is critically ill, prognosis is guarded.  Patient with Multiorgan failure and at high risk for cardiac arrest and death.   ENT consulted for Gae Gallop, M.D.  Pulmonary & [place]

## 2020-09-02 NOTE — Progress Notes (Signed)
A.M. abg results are as follows:  PH: 7.55 PCO2: 52 PO2: 63 HCO: 45.5  BE: 20.6 SO2: 94.4  Spoke with Dana, NP, and dropped set respiratory rate from 26 to 20 after A.M. abg results.

## 2020-09-03 LAB — CBC WITH DIFFERENTIAL/PLATELET
Abs Immature Granulocytes: 0.44 10*3/uL — ABNORMAL HIGH (ref 0.00–0.07)
Basophils Absolute: 0.1 10*3/uL (ref 0.0–0.1)
Basophils Relative: 0 %
Eosinophils Absolute: 0 10*3/uL (ref 0.0–0.5)
Eosinophils Relative: 0 %
HCT: 35.7 % — ABNORMAL LOW (ref 39.0–52.0)
Hemoglobin: 11 g/dL — ABNORMAL LOW (ref 13.0–17.0)
Immature Granulocytes: 3 %
Lymphocytes Relative: 3 %
Lymphs Abs: 0.4 10*3/uL — ABNORMAL LOW (ref 0.7–4.0)
MCH: 28.8 pg (ref 26.0–34.0)
MCHC: 30.8 g/dL (ref 30.0–36.0)
MCV: 93.5 fL (ref 80.0–100.0)
Monocytes Absolute: 0.9 10*3/uL (ref 0.1–1.0)
Monocytes Relative: 6 %
Neutro Abs: 14.3 10*3/uL — ABNORMAL HIGH (ref 1.7–7.7)
Neutrophils Relative %: 88 %
Platelets: 136 10*3/uL — ABNORMAL LOW (ref 150–400)
RBC: 3.82 MIL/uL — ABNORMAL LOW (ref 4.22–5.81)
RDW: 16.2 % — ABNORMAL HIGH (ref 11.5–15.5)
WBC: 16.2 10*3/uL — ABNORMAL HIGH (ref 4.0–10.5)
nRBC: 0.1 % (ref 0.0–0.2)

## 2020-09-03 LAB — BLOOD GAS, ARTERIAL
Acid-Base Excess: 17.6 mmol/L — ABNORMAL HIGH (ref 0.0–2.0)
Acid-Base Excess: 18.8 mmol/L — ABNORMAL HIGH (ref 0.0–2.0)
Allens test (pass/fail): POSITIVE — AB
Bicarbonate: 43.1 mmol/L — ABNORMAL HIGH (ref 20.0–28.0)
Bicarbonate: 44.7 mmol/L — ABNORMAL HIGH (ref 20.0–28.0)
FIO2: 0.45
FIO2: 0.35
MECHVT: 550 mL
O2 Saturation: 97.5 %
O2 Saturation: 93 %
PEEP: 5 cmH2O
PEEP: 5 cmH2O
Patient temperature: 37
Patient temperature: 37
RATE: 20 resp/min
RATE: 20 resp/min
pCO2 arterial: 54 mmHg — ABNORMAL HIGH (ref 32.0–48.0)
pCO2 arterial: 56 mmHg — ABNORMAL HIGH (ref 32.0–48.0)
pH, Arterial: 7.51 — ABNORMAL HIGH (ref 7.350–7.450)
pH, Arterial: 7.51 — ABNORMAL HIGH (ref 7.350–7.450)
pO2, Arterial: 87 mmHg (ref 83.0–108.0)
pO2, Arterial: 60 mmHg — ABNORMAL LOW (ref 83.0–108.0)

## 2020-09-03 LAB — GLUCOSE, CAPILLARY
Glucose-Capillary: 106 mg/dL — ABNORMAL HIGH (ref 70–99)
Glucose-Capillary: 137 mg/dL — ABNORMAL HIGH (ref 70–99)
Glucose-Capillary: 189 mg/dL — ABNORMAL HIGH (ref 70–99)
Glucose-Capillary: 226 mg/dL — ABNORMAL HIGH (ref 70–99)
Glucose-Capillary: 233 mg/dL — ABNORMAL HIGH (ref 70–99)
Glucose-Capillary: 244 mg/dL — ABNORMAL HIGH (ref 70–99)

## 2020-09-03 LAB — C-REACTIVE PROTEIN: CRP: 4.9 mg/dL — ABNORMAL HIGH (ref <1.0)

## 2020-09-03 LAB — FERRITIN: Ferritin: 645 ng/mL — ABNORMAL HIGH (ref 24–336)

## 2020-09-03 LAB — RENAL FUNCTION PANEL
Albumin: 3.1 g/dL — ABNORMAL LOW (ref 3.5–5.0)
Anion gap: 15 (ref 5–15)
BUN: 40 mg/dL — ABNORMAL HIGH (ref 6–20)
CO2: 37 mmol/L — ABNORMAL HIGH (ref 22–32)
Calcium: 9.2 mg/dL (ref 8.9–10.3)
Chloride: 87 mmol/L — ABNORMAL LOW (ref 98–111)
Creatinine, Ser: 0.47 mg/dL — ABNORMAL LOW (ref 0.61–1.24)
GFR, Estimated: >60 mL/min (ref >60)
Glucose, Bld: 240 mg/dL — ABNORMAL HIGH (ref 70–99)
Phosphorus: 4.8 mg/dL — ABNORMAL HIGH (ref 2.5–4.6)
Potassium: 3.8 mmol/L (ref 3.5–5.1)
Sodium: 139 mmol/L (ref 135–145)

## 2020-09-03 LAB — MAGNESIUM: Magnesium: 2.3 mg/dL (ref 1.7–2.4)

## 2020-09-03 LAB — FIBRIN DERIVATIVES D-DIMER (ARMC ONLY): Fibrin derivatives D-dimer (ARMC): 1311.13 ng/mL (FEU) — ABNORMAL HIGH (ref 0.00–499.00)

## 2020-09-03 LAB — PHOSPHORUS: Phosphorus: 4.8 mg/dL — ABNORMAL HIGH (ref 2.5–4.6)

## 2020-09-03 LAB — TRIGLYCERIDES: Triglycerides: 354 mg/dL — ABNORMAL HIGH (ref <150)

## 2020-09-03 MED ORDER — METHYLPREDNISOLONE SODIUM SUCC 40 MG IJ SOLR
40.0000 mg | Freq: Two times a day (BID) | INTRAMUSCULAR | Status: DC
  Administered 2020-09-03 – 2020-09-05 (×4): 40 mg via INTRAVENOUS
  Filled 2020-09-03 (×4): qty 1

## 2020-09-03 NOTE — Progress Notes (Signed)
CRITICAL CARE NOTE   30 yo male recent hospitalization for perforated diverticulitis s/p colostomy with reversal and leakage of anastomosis with s/p revision now acutely hypoxemic with COVID19 induced severe ARDS.   INTERVAL EVENTS: 08/11/20-patient continued to decline with worsening respiratory status despite 100%Fio2 on BIPAP and HFNC failure. He stated that he feels he is dying and cannot breathe. I discussed with wife [name] earlier today that patient is critically ill may need ETT, she is agreeable and thankful for care. I specifically discussed and explained intubation and mechanical ventilation to patient and he wishes to proceed.  08/12/20- patient was weaned on FiO2 to 60%. Spoke to wife [name].  08/13/20- Patient weaned to 40%. During SBT today he self extubated and was placed on HFNC. He subsequently became hypoxemic, disoriented aggitated, removed PIVs and colostomy and proceeded to have combative behavior with worsening oxygenation. I have attempted to calm patient and encouraged him to cooperate. After numerous attempts patient continued to be combative with severe aggitation with combative behavior and would not wear oxygen. Patient was placed on sedation and MV. Wife updated.  08/14/20-patient remains critically ill. Weaned from 100%>>55%. Called and updated wife [name] today 12/27-Patient with worsening oxygenation on FiO2 100% peep of 14 08/16/20-Patient with continued high oxygen requirements overnight. Have spoken with surgery and there should be no problem proning patient with this. Was febrile yesterday and thus broad spectrum abx started and cultures attained.  08-17-20-patient down to PEEP 10 FiO2 55%. Patient never required proning 08/18/20:FiO2 weaned to 40%, PEEP 10, plan for diuresis with Diamox today, adding PO Clonazepam to assist weaning sedation 08/19/20: Vent changed to Pressure Control this morning: 50% FiO2, 24, 22/5, Plan for Recruitment maneuvers  and diuresis 08/20/2020:Continued issues with hypercapnia due to the dead space ventilation. Worsening despite pressure control. Switch back to [facility], prone positioning 08/23/19- patient proned today without incident, continue to wean FiO2 , currently on 50% 08/24/19-patient weaned to 40% FiO2, plan to continue proning with full scope of care. I have spoken to [name] (wife of patient today) she would like to be present prior to next extubation attempt to help console patient and decrease his aggitation. 1/41filed weaning trials, ver agitated , severe hypoxia, Wife at bedside witnessed failed SAT. 1/6 severe resp failure, hypoxia 1/7 proning for 16 hrs, severe ARDS 1/8 PICC line placed, Had a brief PEA arrest while prone. Proningdiscontinued 1/10severe resp failure 1/11severe ARDS, [facility] severe resp failure 09/02/20- patient with no acute events.  Spoke with TJonelle Sidletoday answered questions and plan is for trache.  09/03/20- patient remains on MV. FiO2 down to 35%PRVC plan for trache.  CC  follow up respiratory failure  SUBJECTIVE Patient remains critically ill Prognosis is guarded   BP 129/77   Pulse 68   Temp 98.3 F (36.8 C) (Axillary)   Resp 20   Ht 6' 0.01" (1.829 m)   Wt 101.9 kg   SpO2 (!) 89%   BMI 30.46 kg/m    I/O last 3 completed shifts: In: 2280.5 [I.V.:2280.5] Out: 52482[Urine:4925; Stool:700] Total I/O In: -  Out: 400 [Urine:400]  SpO2: (!) 89 % O2 Flow Rate (L/min): 50 L/min FiO2 (%): 35 %  Estimated body mass index is 30.46 kg/m as calculated from the following:   Height as of this encounter: 6' 0.01" (1.829 m).   Weight as of this encounter: 101.9 kg.  SIGNIFICANT EVENTS   REVIEW OF SYSTEMS  PATIENT IS UNABLE TO PROVIDE COMPLETE REVIEW OF SYSTEMS  DUE TO SEVERE CRITICAL ILLNESS      COVID-19 DISASTER DECLARATION:     PHYSICAL EXAMINATION:  GENERAL:critically ill appearing, +resp distress HEAD: Normocephalic,  atraumatic.  EYES: Pupils equal, round, reactive to light.  No scleral icterus.  MOUTH: Moist mucosal membrane. NECK: Supple. No thyromegaly. No nodules. No JVD.  PULMONARY: +rhonchi imrpoved from previous CARDIOVASCULAR: S1 and S2. Regular rate and rhythm. No murmurs, rubs, or gallops.  GASTROINTESTINAL: Soft, nontender, -distended. Positive bowel sounds.  MUSCULOSKELETAL: No swelling, clubbing, or edema.  NEUROLOGIC: GCS4T SKIN:intact,warm,dry   MEDICATIONS: I have reviewed all medications and confirmed regimen as documented  Nutrition Status: Nutrition Problem: Inadequate oral intake Etiology: inability to eat (pt sedated and ventilated) Signs/Symptoms: NPO status       Indwelling Urinary Catheter continued, requirement due to   Reason to continue Indwelling Urinary Catheter strict Intake/Output monitoring for hemodynamic instability   Central Line/ continued, requirement due to  Reason to continue [place] of central venous pressure or other hemodynamic parameters and poor IV access   Ventilator continued, requirement due to severe respiratory failure   Ventilator Sedation RASS 0 to -2      ASSESSMENT AND PLAN SYNOPSIS  Acute Hypoxic Respiratory Failurein the setting of COVID-19 Pneumonia with severe ARDS perforated diverticulitis s/p colostomy with reversal and leakage of anastomosis with s/p revision now acutely hypoxemic with COVID19 induced severe ARDS,complicated by delirium and agitation Needs Trach for Survival -continue Full MV support -continue Bronchodilator Therapy -Wean Fio2 and PEEP as tolerated -VAP/VENT bundle implementation -09/03/20- decreased steroids to BID 40, d/c seroquel, melatonin.  Met alakalosis due to contraction will hold lasix bid for 24-48h patient is euvolemic   ACUTE DIASTOLIC CARDIAC FAILURE- -oxygen as needed -Lasix as tolerated  Morbid obesity, possible OSA.   Will certainly impact respiratory mechanics,  ventilator weaning Suspect will need to consider additional PEEP    AMS with encephalopathy Due to COVID19 and toxic metabolic encephalopathy - partially due to sedation Goal RASS -2 to -3   Perforated diverticulitis   - patent s/p colostomy     - surgery on case    CARDIAC ICU monitoring   GI GI PROPHYLAXIS as indicated   DIET-->TF's as tolerated Constipation protocol as indicated  ENDO - will use ICU hypoglycemic\Hyperglycemia protocol if indicated     ELECTROLYTES -follow labs as needed -replace as needed -pharmacy consultation and following   DVT/GI PRX ordered and assessed TRANSFUSIONS AS NEEDED MONITOR FSBS I Assessed the need for Labs I Assessed the need for Foley I Assessed the need for Central Venous Line Family Discussion when available I Assessed the need for Mobilization I made an Assessment of medications to be adjusted accordingly Safety Risk assessment completed   CASE DISCUSSED IN MULTIDISCIPLINARY ROUNDS WITH ICU TEAM  Critical Care Time devoted to patient care services described in this note is 31  minutes.   Overall, patient is critically ill, prognosis is guarded.  Patient with Multiorgan failure and at high risk for cardiac arrest and death.   ENT consulted for Gae Gallop, M.D.  Pulmonary & [place]

## 2020-09-04 ENCOUNTER — Inpatient Hospital Stay: Payer: No Typology Code available for payment source

## 2020-09-04 ENCOUNTER — Encounter: Payer: Self-pay | Admitting: Family Medicine

## 2020-09-04 LAB — CBC WITH DIFFERENTIAL/PLATELET
Abs Immature Granulocytes: 0.74 10*3/uL — ABNORMAL HIGH (ref 0.00–0.07)
Basophils Absolute: 0.1 10*3/uL (ref 0.0–0.1)
Basophils Relative: 1 %
Eosinophils Absolute: 0 10*3/uL (ref 0.0–0.5)
Eosinophils Relative: 0 %
HCT: 40.2 % (ref 39.0–52.0)
Hemoglobin: 13.4 g/dL (ref 13.0–17.0)
Immature Granulocytes: 4 %
Lymphocytes Relative: 2 %
Lymphs Abs: 0.3 10*3/uL — ABNORMAL LOW (ref 0.7–4.0)
MCH: 31.5 pg (ref 26.0–34.0)
MCHC: 33.3 g/dL (ref 30.0–36.0)
MCV: 94.4 fL (ref 80.0–100.0)
Monocytes Absolute: 0.7 10*3/uL (ref 0.1–1.0)
Monocytes Relative: 4 %
Neutro Abs: 18 10*3/uL — ABNORMAL HIGH (ref 1.7–7.7)
Neutrophils Relative %: 89 %
Platelets: 139 10*3/uL — ABNORMAL LOW (ref 150–400)
RBC: 4.26 MIL/uL (ref 4.22–5.81)
RDW: 17.3 % — ABNORMAL HIGH (ref 11.5–15.5)
WBC: 19.9 10*3/uL — ABNORMAL HIGH (ref 4.0–10.5)
nRBC: 0.2 % (ref 0.0–0.2)

## 2020-09-04 LAB — GLUCOSE, CAPILLARY
Glucose-Capillary: 145 mg/dL — ABNORMAL HIGH (ref 70–99)
Glucose-Capillary: 230 mg/dL — ABNORMAL HIGH (ref 70–99)
Glucose-Capillary: 264 mg/dL — ABNORMAL HIGH (ref 70–99)
Glucose-Capillary: 274 mg/dL — ABNORMAL HIGH (ref 70–99)
Glucose-Capillary: 224 mg/dL — ABNORMAL HIGH (ref 70–99)
Glucose-Capillary: 193 mg/dL — ABNORMAL HIGH (ref 70–99)

## 2020-09-04 LAB — FERRITIN: Ferritin: 712 ng/mL — ABNORMAL HIGH (ref 24–336)

## 2020-09-04 LAB — RENAL FUNCTION PANEL
Albumin: 3.1 g/dL — ABNORMAL LOW (ref 3.5–5.0)
Anion gap: 10 (ref 5–15)
BUN: 31 mg/dL — ABNORMAL HIGH (ref 6–20)
CO2: 36 mmol/L — ABNORMAL HIGH (ref 22–32)
Calcium: 8.7 mg/dL — ABNORMAL LOW (ref 8.9–10.3)
Chloride: 88 mmol/L — ABNORMAL LOW (ref 98–111)
Creatinine, Ser: 0.34 mg/dL — ABNORMAL LOW (ref 0.61–1.24)
GFR, Estimated: >60 mL/min (ref >60)
Glucose, Bld: 139 mg/dL — ABNORMAL HIGH (ref 70–99)
Phosphorus: 3.6 mg/dL (ref 2.5–4.6)
Potassium: 4 mmol/L (ref 3.5–5.1)
Sodium: 134 mmol/L — ABNORMAL LOW (ref 135–145)

## 2020-09-04 LAB — PHOSPHORUS: Phosphorus: 3.5 mg/dL (ref 2.5–4.6)

## 2020-09-04 LAB — HEPATIC FUNCTION PANEL
ALT: 48 U/L — ABNORMAL HIGH (ref 0–44)
AST: 47 U/L — ABNORMAL HIGH (ref 15–41)
Albumin: 3.1 g/dL — ABNORMAL LOW (ref 3.5–5.0)
Alkaline Phosphatase: 86 U/L (ref 38–126)
Bilirubin, Direct: 0.2 mg/dL (ref 0.0–0.2)
Indirect Bilirubin: 2 mg/dL — ABNORMAL HIGH (ref 0.3–0.9)
Total Bilirubin: 2.2 mg/dL — ABNORMAL HIGH (ref 0.3–1.2)
Total Protein: 7 g/dL (ref 6.5–8.1)

## 2020-09-04 LAB — C-REACTIVE PROTEIN: CRP: <0.5 mg/dL (ref <1.0)

## 2020-09-04 LAB — MAGNESIUM: Magnesium: 2.5 mg/dL — ABNORMAL HIGH (ref 1.7–2.4)

## 2020-09-04 LAB — TRIGLYCERIDES: Triglycerides: 1199 mg/dL — ABNORMAL HIGH (ref <150)

## 2020-09-04 LAB — FIBRIN DERIVATIVES D-DIMER (ARMC ONLY): Fibrin derivatives D-dimer (ARMC): 2485.42 ng/mL (FEU) — ABNORMAL HIGH (ref 0.00–499.00)

## 2020-09-04 IMAGING — DX DG CHEST 1V PORT
1 series · 1 of 1 positions shown · non-contrast
Comparison: [DATE] chest radiograph.

CLINICAL DATA: COVID positive, intubated, fever

EXAM:
PORTABLE CHEST 1 VIEW

[chest ap]
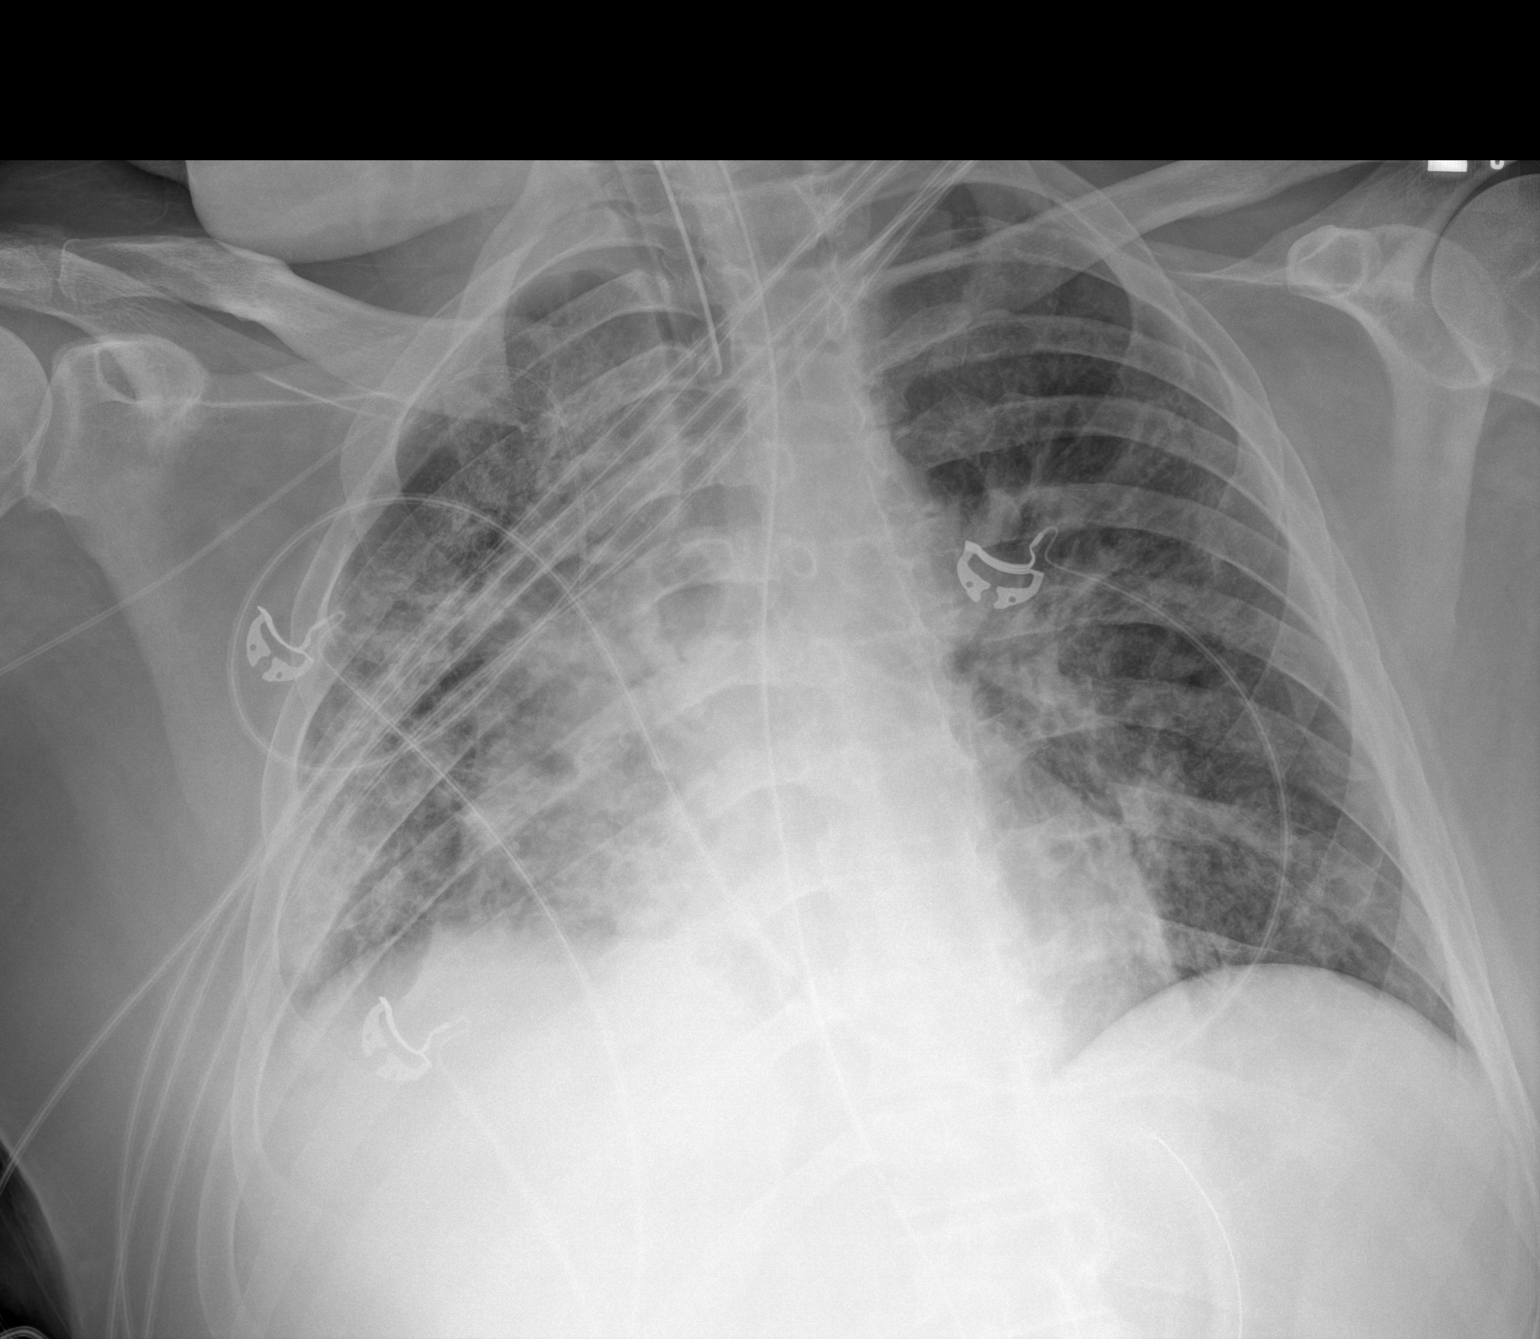

[1 of 1 positions shown; findings below may reference images not displayed]

FINDINGS: Endotracheal tube tip is 4.3 cm above the carina. Enteric tube
terminates in the gastric fundus. Right PICC terminates over the
lower third of the SVC. Right rotated chest radiograph. Stable
cardiomediastinal silhouette with normal heart size. No
pneumothorax. No pleural effusion. Extensive patchy opacities
throughout the right greater than left lungs, unchanged.
IMPRESSION: 1. Well-positioned support structures.
2. Stable extensive patchy opacities throughout the right greater
than left lungs, compatible with [K6] pneumonia.

## 2020-09-04 MED ORDER — LEVETIRACETAM IN NACL 500 MG/100ML IV SOLN
500.0000 mg | Freq: Two times a day (BID) | INTRAVENOUS | Status: DC
  Administered 2020-09-04 – 2020-09-05 (×2): 500 mg via INTRAVENOUS
  Filled 2020-09-04 (×3): qty 100

## 2020-09-04 MED ORDER — PHENOBARBITAL SODIUM 65 MG/ML IJ SOLN
65.0000 mg | Freq: Two times a day (BID) | INTRAMUSCULAR | Status: DC
  Administered 2020-09-04 – 2020-09-08 (×10): 65 mg via INTRAVENOUS
  Filled 2020-09-04 (×10): qty 1

## 2020-09-04 NOTE — Progress Notes (Signed)
Pt has remained unresponsive to external stimuli, RASS -4 -> versed gtt, dilaudid gtt, currently weaning prop d/t elevated triglycerides with phenobarbital pushes and keppra infusions. Propofol weaning recently interrupted d/t persistent gagging leading to TF aspiration, SpO2 drop to 87%, and sustained tachycardia 130s-> TF HELD and prop increased to previous rate until MD guidance. Pt has remained on PRVC. No WUA/SBT per MD. FiO2 decreased to 40% through shift, but increased to 50% after TF aspiration -> RT made aware. Pt has remained in NSR on cardiac monitor, HR/BP WNL.  Wife visited and updated at bedside.

## 2020-09-04 NOTE — Progress Notes (Signed)
CRITICAL CARE NOTE   30 yo male recent hospitalization for perforated diverticulitis s/p colostomy with reversal and leakage of anastomosis with s/p revision now acutely hypoxemic with COVID19 induced severe ARDS.   INTERVAL EVENTS: 08/11/20-patient continued to decline with worsening respiratory status despite 100%Fio2 on BIPAP and HFNC failure. He stated that he feels he is dying and cannot breathe. I discussed with wife [name] earlier today that patient is critically ill may need ETT, she is agreeable and thankful for care. I specifically discussed and explained intubation and mechanical ventilation to patient and he wishes to proceed.  08/12/20- patient was weaned on FiO2 to 60%. Spoke to wife [name].  08/13/20- Patient weaned to 40%. During SBT today he self extubated and was placed on HFNC. He subsequently became hypoxemic, disoriented aggitated, removed PIVs and colostomy and proceeded to have combative behavior with worsening oxygenation. I have attempted to calm patient and encouraged him to cooperate. After numerous attempts patient continued to be combative with severe aggitation with combative behavior and would not wear oxygen. Patient was placed on sedation and MV. Wife updated.  08/14/20-patient remains critically ill. Weaned from 100%>>55%. Called and updated wife [name] today 12/27-Patient with worsening oxygenation on FiO2 100% peep of 14 08/16/20-Patient with continued high oxygen requirements overnight. Have spoken with surgery and there should be no problem proning patient with this. Was febrile yesterday and thus broad spectrum abx started and cultures attained.  08-17-20-patient down to PEEP 10 FiO2 55%. Patient never required proning 08/18/20:FiO2 weaned to 40%, PEEP 10, plan for diuresis with Diamox today, adding PO Clonazepam to assist weaning sedation 08/19/20: Vent changed to Pressure Control this morning: 50% FiO2, 24, 22/5, Plan for Recruitment maneuvers  and diuresis 08/20/2020:Continued issues with hypercapnia due to the dead space ventilation. Worsening despite pressure control. Switch back to [facility], prone positioning 08/23/19- patient proned today without incident, continue to wean FiO2 , currently on 50% 08/24/19-patient weaned to 40% FiO2, plan to continue proning with full scope of care. I have spoken to [name] (wife of patient today) she would like to be present prior to next extubation attempt to help console patient and decrease his aggitation. 1/52filed weaning trials, ver agitated , severe hypoxia, Wife at bedside witnessed failed SAT. 1/6 severe resp failure, hypoxia 1/7 proning for 16 hrs, severe ARDS 1/8 PICC line placed, Had a brief PEA arrest while prone. Proningdiscontinued 1/10severe resp failure 1/11severe ARDS, [facility] failure 09/02/20- patient with no acute events.  Spoke with TJonelle Sidletoday answered questions and plan is for trache.  09/03/20- patient remains on MV. FiO2 down to 35%PRVC plan for trache. 09/04/20- patient with TG>1000 will switch off propofol but patient with very high sedation requirement, will discuss options with pharmD.  Plan for trache.  Met with wife [name] at bedside.   CC  follow up respiratory failure  SUBJECTIVE Patient remains critically ill Prognosis is guarded   BP 120/81   Pulse (!) 102   Temp 99.5 F (37.5 C) (Axillary)   Resp 20   Ht 6' 0.01" (1.829 m)   Wt 102.1 kg   SpO2 93%   BMI 30.52 kg/m    I/O last 3 completed shifts: In: 3561.4 [I.V.:2297.5; NG/GT:1263.9] Out: 42951[Urine:4120; Stool:825] Total I/O In: 289.6 [I.V.:89.6; NG/GT:200] Out: 20 [Urine:20]  SpO2: 93 % O2 Flow Rate (L/min): 50 L/min FiO2 (%): 45 %  Estimated body mass index is 30.52 kg/m as calculated from the following:   Height as of  this encounter: 6' 0.01" (1.829 m).   Weight as of this encounter: 102.1 kg.  SIGNIFICANT EVENTS   REVIEW OF  SYSTEMS  PATIENT IS UNABLE TO PROVIDE COMPLETE REVIEW OF SYSTEMS DUE TO SEVERE CRITICAL ILLNESS      COVID-19 DISASTER DECLARATION:     PHYSICAL EXAMINATION:  GENERAL:critically ill appearing, +resp distress HEAD: Normocephalic, atraumatic.  EYES: Pupils equal, round, reactive to light.  No scleral icterus.  MOUTH: Moist mucosal membrane. NECK: Supple. No thyromegaly. No nodules. No JVD.  PULMONARY: +rhonchi imrpoved from previous CARDIOVASCULAR: S1 and S2. Regular rate and rhythm. No murmurs, rubs, or gallops.  GASTROINTESTINAL: Soft, nontender, -distended. Positive bowel sounds.  MUSCULOSKELETAL: No swelling, clubbing, or edema.  NEUROLOGIC: GCS4T SKIN:intact,warm,dry   MEDICATIONS: I have reviewed all medications and confirmed regimen as documented  Nutrition Status: Nutrition Problem: Inadequate oral intake Etiology: inability to eat (pt sedated and ventilated) Signs/Symptoms: NPO status       Indwelling Urinary Catheter continued, requirement due to   Reason to continue Indwelling Urinary Catheter strict Intake/Output monitoring for hemodynamic instability   Central Line/ continued, requirement due to  Reason to continue [place] of central venous pressure or other hemodynamic parameters and poor IV access   Ventilator continued, requirement due to severe respiratory failure   Ventilator Sedation RASS 0 to -2      ASSESSMENT AND PLAN SYNOPSIS  Acute Hypoxic Respiratory Failurein the setting of COVID-19 Pneumonia with severe ARDS perforated diverticulitis s/p colostomy with reversal and leakage of anastomosis with s/p revision now acutely hypoxemic with COVID19 induced severe ARDS,complicated by delirium and agitation Needs Trach for Survival -continue Full MV support -continue Bronchodilator Therapy -Wean Fio2 and PEEP as tolerated -VAP/VENT bundle implementation -09/03/20- decreased steroids to BID 40, d/c seroquel, melatonin.  Met  alakalosis due to contraction will hold lasix bid for 24-48h patient is euvolemic   ACUTE DIASTOLIC CARDIAC FAILURE- -oxygen as needed -Lasix as tolerated  Morbid obesity, possible OSA.   Will certainly impact respiratory mechanics, ventilator weaning Suspect will need to consider additional PEEP    AMS with encephalopathy Due to COVID19 and toxic metabolic encephalopathy - partially due to sedation Goal RASS -2 to -3   Perforated diverticulitis   - patent s/p colostomy     - surgery on case    CARDIAC ICU monitoring   GI GI PROPHYLAXIS as indicated   DIET-->TF's as tolerated Constipation protocol as indicated  ENDO - will use ICU hypoglycemic\Hyperglycemia protocol if indicated     ELECTROLYTES -follow labs as needed -replace as needed -pharmacy consultation and following   DVT/GI PRX ordered and assessed TRANSFUSIONS AS NEEDED MONITOR FSBS I Assessed the need for Labs I Assessed the need for Foley I Assessed the need for Central Venous Line Family Discussion when available I Assessed the need for Mobilization I made an Assessment of medications to be adjusted accordingly Safety Risk assessment completed   CASE DISCUSSED IN MULTIDISCIPLINARY ROUNDS WITH ICU TEAM  Critical Care Time devoted to patient care services described in this note is 31  minutes.   Overall, patient is critically ill, prognosis is guarded.  Patient with Multiorgan failure and at high risk for cardiac arrest and death.   ENT consulted for Gae Gallop, M.D.  Pulmonary & [place]

## 2020-09-05 ENCOUNTER — Encounter: Payer: Self-pay | Admitting: Anesthesiology

## 2020-09-05 ENCOUNTER — Inpatient Hospital Stay: Payer: No Typology Code available for payment source

## 2020-09-05 ENCOUNTER — Encounter: Payer: Self-pay | Admitting: Family Medicine

## 2020-09-05 DIAGNOSIS — U071 COVID-19: Secondary | ICD-10-CM | POA: Diagnosis not present

## 2020-09-05 DIAGNOSIS — J9601 Acute respiratory failure with hypoxia: Secondary | ICD-10-CM | POA: Diagnosis not present

## 2020-09-05 LAB — GLUCOSE, CAPILLARY
Glucose-Capillary: 109 mg/dL — ABNORMAL HIGH (ref 70–99)
Glucose-Capillary: 118 mg/dL — ABNORMAL HIGH (ref 70–99)
Glucose-Capillary: 124 mg/dL — ABNORMAL HIGH (ref 70–99)
Glucose-Capillary: 127 mg/dL — ABNORMAL HIGH (ref 70–99)
Glucose-Capillary: 160 mg/dL — ABNORMAL HIGH (ref 70–99)
Glucose-Capillary: 191 mg/dL — ABNORMAL HIGH (ref 70–99)
Glucose-Capillary: 92 mg/dL (ref 70–99)

## 2020-09-05 LAB — RENAL FUNCTION PANEL
Albumin: 2.8 g/dL — ABNORMAL LOW (ref 3.5–5.0)
Anion gap: 8 (ref 5–15)
BUN: 32 mg/dL — ABNORMAL HIGH (ref 6–20)
CO2: 39 mmol/L — ABNORMAL HIGH (ref 22–32)
Calcium: 8.8 mg/dL — ABNORMAL LOW (ref 8.9–10.3)
Chloride: 92 mmol/L — ABNORMAL LOW (ref 98–111)
Creatinine, Ser: <0.3 mg/dL — ABNORMAL LOW (ref 0.61–1.24)
Glucose, Bld: 115 mg/dL — ABNORMAL HIGH (ref 70–99)
Phosphorus: 2.9 mg/dL (ref 2.5–4.6)
Potassium: 3.7 mmol/L (ref 3.5–5.1)
Sodium: 139 mmol/L (ref 135–145)

## 2020-09-05 LAB — CBC WITH DIFFERENTIAL/PLATELET
Abs Immature Granulocytes: 0.68 10*3/uL — ABNORMAL HIGH (ref 0.00–0.07)
Basophils Absolute: 0 10*3/uL (ref 0.0–0.1)
Basophils Relative: 0 %
Eosinophils Absolute: 0 10*3/uL (ref 0.0–0.5)
Eosinophils Relative: 0 %
HCT: 36.7 % — ABNORMAL LOW (ref 39.0–52.0)
Hemoglobin: 11.1 g/dL — ABNORMAL LOW (ref 13.0–17.0)
Immature Granulocytes: 5 %
Lymphocytes Relative: 4 %
Lymphs Abs: 0.6 10*3/uL — ABNORMAL LOW (ref 0.7–4.0)
MCH: 28.4 pg (ref 26.0–34.0)
MCHC: 30.2 g/dL (ref 30.0–36.0)
MCV: 93.9 fL (ref 80.0–100.0)
Monocytes Absolute: 0.7 10*3/uL (ref 0.1–1.0)
Monocytes Relative: 5 %
Neutro Abs: 11.5 10*3/uL — ABNORMAL HIGH (ref 1.7–7.7)
Neutrophils Relative %: 86 %
Platelets: 111 10*3/uL — ABNORMAL LOW (ref 150–400)
RBC: 3.91 MIL/uL — ABNORMAL LOW (ref 4.22–5.81)
RDW: 16.3 % — ABNORMAL HIGH (ref 11.5–15.5)
WBC: 13.4 10*3/uL — ABNORMAL HIGH (ref 4.0–10.5)
nRBC: 0 % (ref 0.0–0.2)

## 2020-09-05 LAB — FERRITIN: Ferritin: 965 ng/mL — ABNORMAL HIGH (ref 24–336)

## 2020-09-05 LAB — TRIGLYCERIDES: Triglycerides: 318 mg/dL — ABNORMAL HIGH (ref <150)

## 2020-09-05 LAB — PROCALCITONIN: Procalcitonin: 0.63 ng/mL

## 2020-09-05 LAB — FIBRIN DERIVATIVES D-DIMER (ARMC ONLY): Fibrin derivatives D-dimer (ARMC): 2341.27 ng/mL (FEU) — ABNORMAL HIGH (ref 0.00–499.00)

## 2020-09-05 LAB — C-REACTIVE PROTEIN: CRP: 13.4 mg/dL — ABNORMAL HIGH (ref <1.0)

## 2020-09-05 LAB — MAGNESIUM: Magnesium: 2.4 mg/dL (ref 1.7–2.4)

## 2020-09-05 IMAGING — DX DG CHEST 1V PORT
1 series · 1 of 1 positions shown · non-contrast
Comparison: [DATE]

CLINICAL DATA: Endotracheal tube placement

EXAM:
PORTABLE CHEST 1 VIEW

[chest ap]
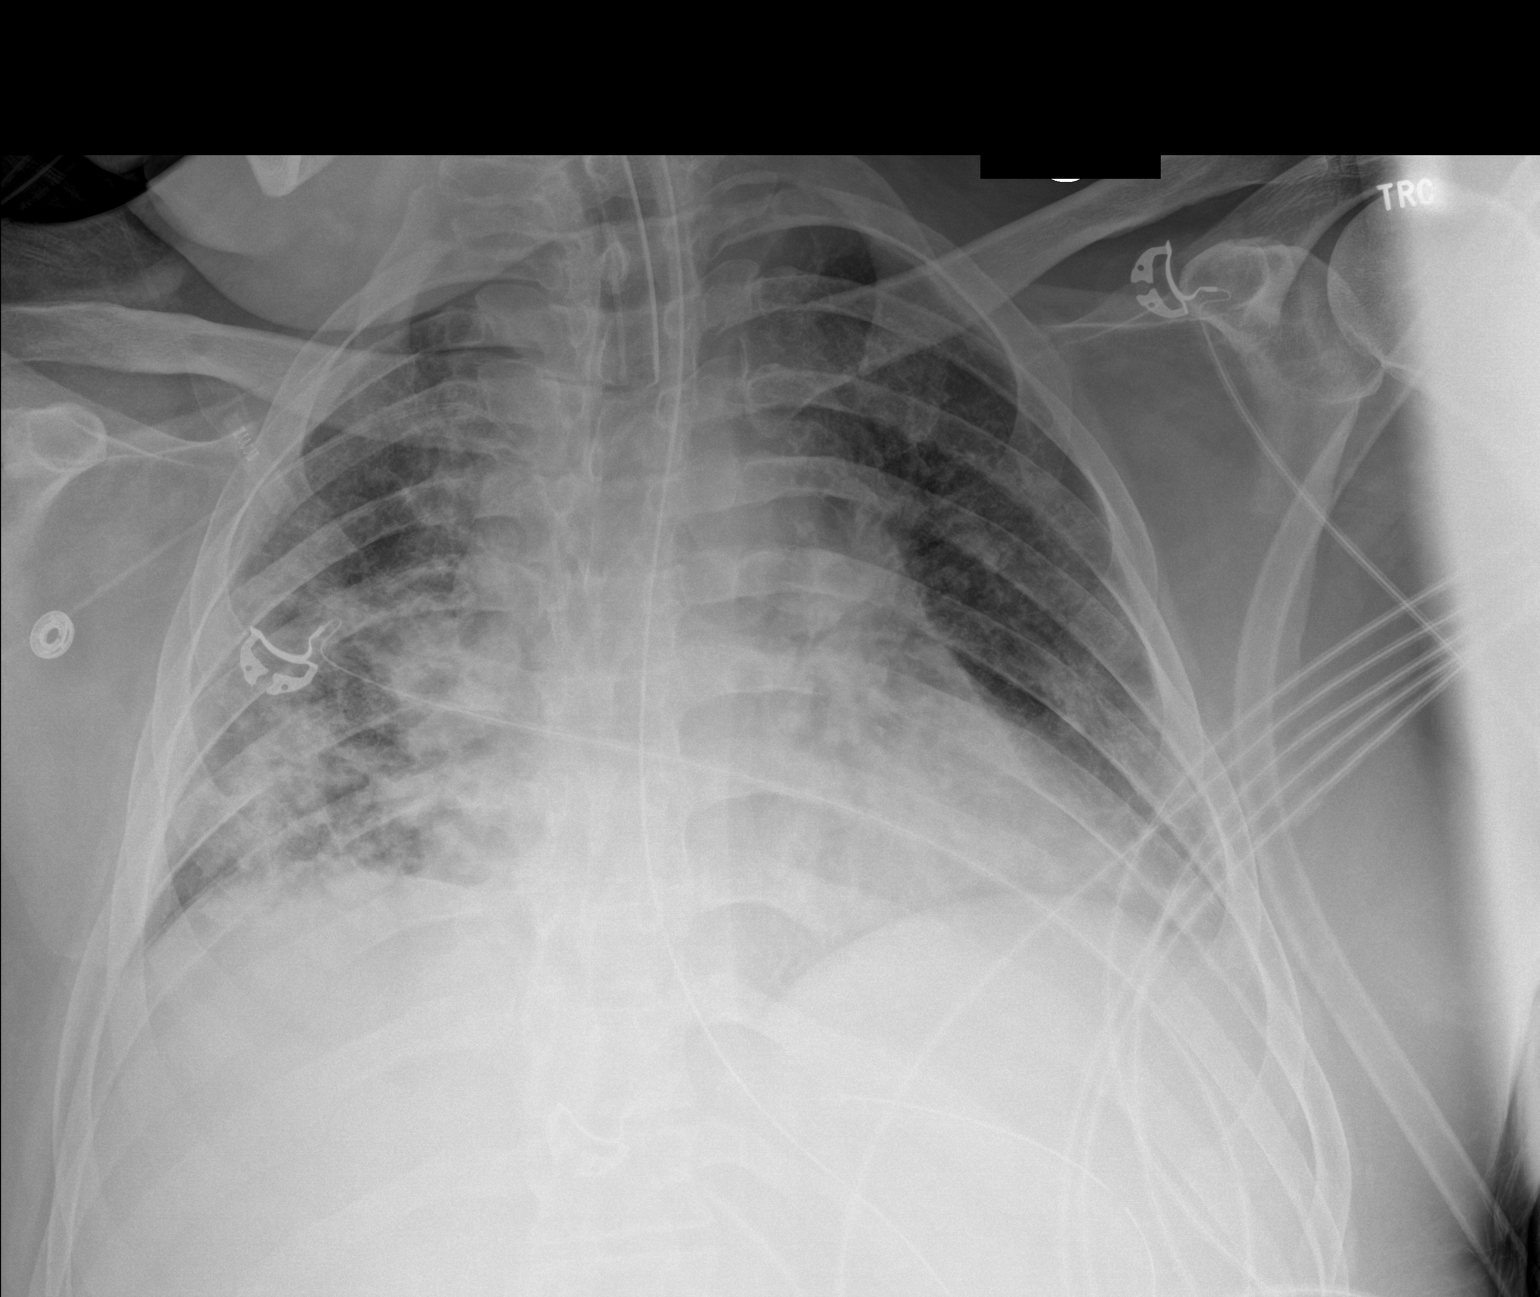

[1 of 1 positions shown; findings below may reference images not displayed]

FINDINGS: The support devices appear stable. Bilateral airspace opacities are
again noted. There is no pneumothorax. No large pleural effusion.
The heart size is stable from prior study. There is no acute osseous
abnormality.
IMPRESSION: No significant short interval change.

## 2020-09-05 MED ORDER — METOPROLOL TARTRATE 5 MG/5ML IV SOLN
5.0000 mg | Freq: Every day | INTRAVENOUS | Status: AC | PRN
  Administered 2020-09-05 – 2020-09-06 (×3): 5 mg via INTRAVENOUS
  Filled 2020-09-05 (×3): qty 5

## 2020-09-05 MED ORDER — PHENYLEPHRINE CONCENTRATED 100MG/250ML (0.4 MG/ML) INFUSION SIMPLE
0.0000 ug/min | INTRAVENOUS | Status: DC
  Administered 2020-09-05: 10 ug/min via INTRAVENOUS
  Filled 2020-09-05: qty 250

## 2020-09-05 MED ORDER — FENTANYL 2500MCG IN NS 250ML (10MCG/ML) PREMIX INFUSION
0.0000 ug/h | INTRAVENOUS | Status: DC
  Administered 2020-09-05: 25 ug/h via INTRAVENOUS
  Administered 2020-09-06: 200 ug/h via INTRAVENOUS
  Administered 2020-09-07: 175 ug/h via INTRAVENOUS
  Administered 2020-09-07: 200 ug/h via INTRAVENOUS
  Administered 2020-09-07: 300 ug/h via INTRAVENOUS
  Administered 2020-09-08: 200 ug/h via INTRAVENOUS
  Filled 2020-09-05 (×6): qty 250

## 2020-09-05 MED ORDER — DEXMEDETOMIDINE HCL IN NACL 400 MCG/100ML IV SOLN
0.4000 ug/kg/h | INTRAVENOUS | Status: DC
  Administered 2020-09-05: 1.2 ug/kg/h via INTRAVENOUS
  Administered 2020-09-05: 0.4 ug/kg/h via INTRAVENOUS
  Administered 2020-09-05: 1 ug/kg/h via INTRAVENOUS
  Administered 2020-09-06: 0.8 ug/kg/h via INTRAVENOUS
  Administered 2020-09-06: 0.4 ug/kg/h via INTRAVENOUS
  Administered 2020-09-06 (×4): 1 ug/kg/h via INTRAVENOUS
  Administered 2020-09-07: 0.8 ug/kg/h via INTRAVENOUS
  Administered 2020-09-07 (×3): 1.2 ug/kg/h via INTRAVENOUS
  Administered 2020-09-08 – 2020-09-09 (×10): 0.8 ug/kg/h via INTRAVENOUS
  Administered 2020-09-10 (×2): 1 ug/kg/h via INTRAVENOUS
  Administered 2020-09-10: 1.2 ug/kg/h via INTRAVENOUS
  Administered 2020-09-10: 0.782 ug/kg/h via INTRAVENOUS
  Administered 2020-09-10: 0.6 ug/kg/h via INTRAVENOUS
  Administered 2020-09-10 – 2020-09-11 (×2): 1.2 ug/kg/h via INTRAVENOUS
  Administered 2020-09-11: 1 ug/kg/h via INTRAVENOUS
  Administered 2020-09-11 (×3): 1.2 ug/kg/h via INTRAVENOUS
  Administered 2020-09-11: 1 ug/kg/h via INTRAVENOUS
  Administered 2020-09-11 – 2020-09-12 (×5): 1.2 ug/kg/h via INTRAVENOUS
  Filled 2020-09-05 (×39): qty 100

## 2020-09-05 MED ORDER — ACETAMINOPHEN 160 MG/5ML PO SOLN
650.0000 mg | Freq: Four times a day (QID) | ORAL | Status: DC | PRN
  Administered 2020-09-06: 650 mg
  Filled 2020-09-05 (×3): qty 20.3

## 2020-09-05 MED ORDER — FUROSEMIDE 10 MG/ML IJ SOLN
20.0000 mg | Freq: Once | INTRAMUSCULAR | Status: AC
  Administered 2020-09-05: 20 mg via INTRAVENOUS
  Filled 2020-09-05: qty 2

## 2020-09-05 MED ORDER — METHYLPREDNISOLONE SODIUM SUCC 40 MG IJ SOLR
40.0000 mg | Freq: Every day | INTRAMUSCULAR | Status: DC
  Administered 2020-09-06 – 2020-09-07 (×2): 40 mg via INTRAVENOUS
  Filled 2020-09-05 (×2): qty 1

## 2020-09-05 MED ORDER — HYDRALAZINE HCL 20 MG/ML IJ SOLN
20.0000 mg | INTRAMUSCULAR | Status: DC | PRN
  Administered 2020-09-05: 20 mg via INTRAVENOUS
  Filled 2020-09-05: qty 1

## 2020-09-05 NOTE — Anesthesia Preprocedure Evaluation (Deleted)
Anesthesia Evaluation  Patient identified by MRN, date of birth, ID band Patient confused    Reviewed: Allergy & Precautions, NPO status , Patient's Chart, lab work & pertinent test results  History of Anesthesia Complications Negative for: history of anesthetic complications  Airway Mallampati: III  TM Distance: >3 FB Neck ROM: limited    Dental  (+) Poor Dentition   Pulmonary neg sleep apnea, pneumonia, neg COPD,    Pulmonary exam normal        Cardiovascular hypertension, Pt. on medications (-) CAD, (-) Past MI, (-) Cardiac Stents and (-) CABG Normal cardiovascular exam - Systolic murmurs    Neuro/Psych neg Seizures negative neurological ROS  negative psych ROS   GI/Hepatic Neg liver ROS, Anastomotic leak   Endo/Other  negative endocrine ROSneg diabetes  Renal/GU Renal disease     Musculoskeletal negative musculoskeletal ROS (+)   Abdominal (+) + obese,   Peds  Hematology negative hematology ROS (+)   Anesthesia Other Findings Past Medical History: No date: Gout No date: Hypertension No date: Rupture of bowel (HCC)  BMI    Body Mass Index: 30.58 kg/m      Reproductive/Obstetrics                            Anesthesia Physical  Anesthesia Plan  ASA: IV  Anesthesia Plan: General ETT   Post-op Pain Management:    Induction: Intravenous, Rapid sequence and Cricoid pressure planned  PONV Risk Score and Plan: 1 and Ondansetron, Dexamethasone and Midazolam  Airway Management Planned: Oral ETT  Additional Equipment:   Intra-op Plan:   Post-operative Plan: Post-operative intubation/ventilation  Informed Consent: I have reviewed the patients History and Physical, chart, labs and discussed the procedure including the risks, benefits and alternatives for the proposed anesthesia with the patient or authorized representative who has indicated his/her understanding and  acceptance.     Dental advisory given  Plan Discussed with: CRNA and Anesthesiologist  Anesthesia Plan Comments: (History and phone consent from the patients wife Donato Schultz  [phone]   She was consented for risks of anesthesia including but not limited to:  - adverse reactions to medications - damage to eyes, teeth, lips or other oral mucosa - nerve damage due to positioning  - sore throat or hoarseness - Damage to heart, brain, nerves, lungs, other parts of body or loss of life  She voiced understanding.)        Anesthesia Quick Evaluation

## 2020-09-05 NOTE — Progress Notes (Signed)
CRITICAL CARE NOTE  CC   30 yo male recent hospitalization for perforated diverticulitis s/p colostomy with reversal and leakage of anastomosis with s/p revision  Severe acute hypoxemic  resp failure with COVID19 induced severe ARDS.s/p inrubation/on ventilator   SUBJECTIVE Patient remains critically ill Prognosis is guarded remains intubated/sedated Was agitated last night and started on keppra and phenobarb . He is already on propofol/versed/dilauded     SIGNIFICANT EVENTS As  Above Aspiration with TF suctioned from ET tube    BP 130/71 (BP Location: Right Wrist)   Pulse 81   Temp 98.3 F (36.8 C) (Axillary)   Resp 20   Ht 6' 0.01" (1.829 m)   Wt 102.3 kg   SpO2 92%   BMI 30.58 kg/m    REVIEW OF SYSTEMS  PATIENT IS UNABLE TO PROVIDE COMPLETE REVIEW OF SYSTEMS DUE TO SEVERE CRITICAL ILLNESS   PHYSICAL EXAMINATION:  GENERAL:critically ill appearing, no resp distress HEAD: Normocephalic, atraumatic.  EYES: Pupils equal, round, reactive to light.  No scleral icterus.  MOUTH: Moist mucosal membrane. NECK: Supple. No thyromegaly. No nodules. No JVD.  PULMONARY: + NO rhonchi or wheezing, few basal crackles CARDIOVASCULAR: S1 and S2. Regular rate and rhythm. No murmurs, rubs, or gallops.  GASTROINTESTINAL: Soft, nontender, -distended. No masses. Positive bowel sounds. No hepatosplenomegaly.  MUSCULOSKELETAL: No swelling, clubbing, or edema.  NEUROLOGIC:intubated and sedatedSKIN:intact,warm,dry  INTAKE/OUTPUT  Intake/Output Summary (Last 24 hours) at 09/05/2020 0916 Last data filed at 09/05/2020 0800 Gross per 24 hour  Intake 1989.84 ml  Output 1605 ml  Net 384.84 ml    LABS  CBC Recent Labs  Lab 09/03/20 0346 09/04/20 0506 09/05/20 0438  WBC 16.2* 19.9* 13.4*  HGB 11.0* 13.4 11.1*  HCT 35.7* 40.2 36.7*  PLT 136* 139* 111*   Coag's No results for input(s): APTT, INR in the last 168 hours. BMET Recent Labs  Lab 09/03/20 0346 09/04/20 0448  09/05/20 0439  NA 139 134* 139  K 3.8 4.0 3.7  CL 87* 88* 92*  CO2 37* 36* 39*  BUN 40* 31* 32*  CREATININE 0.47* 0.34* <0.30*  GLUCOSE 240* 139* 115*   Electrolytes Recent Labs  Lab 09/03/20 0346 09/04/20 0448 09/04/20 0506 09/05/20 0438 09/05/20 0439  CALCIUM 9.2 8.7*  --   --  8.8*  MG 2.3  --  2.5* 2.4  --   PHOS 4.8*  4.8* 3.6 3.5  --  2.9   Sepsis Markers Recent Labs  Lab 09/05/20 0438  PROCALCITON 0.63   ABG Recent Labs  Lab 09/02/20 0500 09/03/20 0500 09/03/20 0956  PHART 7.55* 7.51* 7.51*  PCO2ART 52* 54* 56*  PO2ART 63* 87 60*   Liver Enzymes Recent Labs  Lab 09/04/20 0448 09/04/20 0506 09/05/20 0439  AST  --  47*  --   ALT  --  48*  --   ALKPHOS  --  86  --   BILITOT  --  2.2*  --   ALBUMIN 3.1* 3.1* 2.8*   Cardiac Enzymes No results for input(s): TROPONINI, PROBNP in the last 168 hours. Glucose Recent Labs  Lab 09/04/20 1306 09/04/20 1554 09/04/20 1912 09/05/20 0018 09/05/20 0320 09/05/20 0712  GLUCAP 274* 224* 193* 191* 118* 124*     No results found for this or any previous visit (from the past 240 hour(s)).  MEDICATIONS   Current Facility-Administered Medications:  .  0.9 %  sodium chloride infusion, 250 mL, Intravenous, PRN, Opyd, Ilene Qua, MD, Stopped at 08/22/20 0039 .  0.9 %  sodium chloride infusion, 250 mL, Intravenous, Continuous, Blakeney, Dana G, NP .  albuterol (VENTOLIN HFA) 108 (90 Base) MCG/ACT inhaler 2 puff, 2 puff, Inhalation, Q6H PRN, Opyd, Ilene Qua, MD .  allopurinol (ZYLOPRIM) tablet 300 mg, 300 mg, Per Tube, Daily, Lorella Nimrod, MD, 300 mg at 09/04/20 1313 .  cetirizine (ZYRTEC) tablet 10 mg, 10 mg, Per Tube, Daily, Kasa, Kurian, MD, 10 mg at 09/04/20 1313 .  chlorhexidine gluconate (MEDLINE KIT) (PERIDEX) 0.12 % solution 15 mL, 15 mL, Mouth Rinse, BID, Aleskerov, Fuad, MD, 15 mL at 09/05/20 0730 .  Chlorhexidine Gluconate Cloth 2 % PADS 6 each, 6 each, Topical, Daily, Ottie Glazier, MD, 6 each at  09/04/20 [phone] .  clonazePAM (KLONOPIN) tablet 2 mg, 2 mg, Per Tube, BID, Benita Gutter, RPH, 2 mg at 09/04/20 2210 .  famotidine (PEPCID) tablet 20 mg, 20 mg, Per Tube, BID, [name], MD, 20 mg at 09/04/20 2210 .  feeding supplement (PROSource TF) liquid 45 mL, 45 mL, Per Tube, 5 X Daily, Kasa, Kurian, MD, 45 mL at 09/05/20 0511 .  feeding supplement (VITAL 1.5 CAL) liquid 1,000 mL, 1,000 mL, Per Tube, Continuous, Kasa, Kurian, MD, Last Rate: 65 mL/hr at 09/04/20 0700, Infusion Verify at 09/04/20 0700 .  fentaNYL (DURAGESIC) 100 MCG/HR 1 patch, 1 patch, Transdermal, Q72H, [name], MD, 1 patch at 09/03/20 1255 .  free water 200 mL, 200 mL, Per Tube, Q4H, Candee Furbish, MD, 200 mL at 09/05/20 0447 .  HYDROmorphone (DILAUDID) 50 mg in sodium chloride 0.9 % 100 mL (0.5 mg/mL) infusion, 1-5 mg/hr, Intravenous, Continuous, Blakeney, Dana G, NP, Last Rate: 6 mL/hr at 09/05/20 0800, 3 mg/hr at 09/05/20 0800 .  insulin aspart (novoLOG) injection 16 Units, 16 Units, Subcutaneous, Q4H, [name], MD, 16 Units at 09/04/20 2043 .  insulin aspart (novoLOG) injection 2-6 Units, 2-6 Units, Subcutaneous, Q4H, [name], MD, 2 Units at 09/05/20 0739 .  insulin detemir (LEVEMIR) injection 45 Units, 45 Units, Subcutaneous, Q12H, [name], MD, 45 Units at 09/05/20 0052 .  MEDLINE mouth rinse, 15 mL, Mouth Rinse, 10 times per day, Ottie Glazier, MD, 15 mL at 09/05/20 0602 .  [START ON 09/06/2020] methylPREDNISolone sodium succinate (SOLU-MEDROL) 40 mg/mL injection 40 mg, 40 mg, Intravenous, Daily, Rosine Door, MD .  metoprolol tartrate (LOPRESSOR) tablet 25 mg, 25 mg, Per Tube, BID, [name], MD, 25 mg at 09/04/20 2209 .  midazolam (VERSED) 50 mg/50 mL (1 mg/mL) premix infusion, 0.5-20 mg/hr, Intravenous, Continuous, Kasa, Kurian, MD, Last Rate: 11.5 mL/hr at 09/05/20 0800, 11.5 mg/hr at 09/05/20 0800 .  norepinephrine (LEVOPHED) 45m in 2589mpremix infusion, 2-10 mcg/min, Intravenous,  Titrated, BlAwilda BillNP, Stopped at 08/30/20 1525 .  ondansetron (ZOFRAN) tablet 4 mg, 4 mg, Oral, Q6H PRN **OR** ondansetron (ZOFRAN) injection 4 mg, 4 mg, Intravenous, Q6H PRN, Opyd, Timothy S, MD .  oxyCODONE (Oxy IR/ROXICODONE) immediate release tablet 5 mg, 5 mg, Oral, Q6H, Mannam, Praveen, MD, 5 mg at 09/05/20 0602 .  PHENObarbital (LUMINAL) injection 65 mg, 65 mg, Intravenous, BID, Kasa, Kurian, MD, 65 mg at 09/04/20 2210 .  propofol (DIPRIVAN) 1000 MG/100ML infusion, 5-80 mcg/kg/min, Intravenous, Titrated, KeDarel Hong, NP, Last Rate: 20.2 mL/hr at 09/05/20 0853, 30 mcg/kg/min at 09/05/20 0853 .  sodium chloride flush (NS) 0.9 % injection 10-40 mL, 10-40 mL, Intracatheter, Q12H, Kasa, Kurian, MD, 10 mL at 09/04/20 2210 .  sodium chloride flush (NS) 0.9 % injection 10-40 mL, 10-40 mL,  Intracatheter, PRN, [name], MD .  vecuronium (NORCURON) injection 10 mg, 10 mg, Intravenous, Q1H PRN, Rust-Chester, Toribio Harbour L, NP, 10 mg at 08/29/20 1543      Indwelling Urinary Catheter continued, requirement due to   Reason to continue Indwelling Urinary Catheter for strict Intake/Output monitoring for hemodynamic instability   Central Line continued, requirement due to   Reason to continue [employer] Monitoring of central venous pressure or other hemodynamic parameters   Ventilator continued, requirement due to, resp failure    Ventilator Sedation RASS 0 to -2     ASSESSMENT AND PLAN SYNOPSIS   Acute Hypoxic Respiratory Failurein the setting of COVID-19 Pneumonia with severe ARDS perforated diverticulitis s/p colostomy with reversal and leakage of anastomosis with s/p revision now acutely hypoxemic with COVID19 induced severe ARDS,complicated by delirium and agitation likely needs Needs Trach for Survival as gets very agitated Will try sedation vacation again today,,stop keppra Cont phenobarb, propofol/versed/dilauded -continue Full MV support -continue  Bronchodilator Therapy -Wean Fio2 and PEEP as tolerated -VAP/VENT bundle implementation Taper steroids -  ACUTE DIASTOLIC CARDIAC FAILURE- -oxygen as needed -Lasix as tolerated  Morbid obesity, possible OSA.  On vent, may ned PAP after extubation  AMS with encephalopathy Due to COVID19 and toxic metabolic encephalopathy - partially due to sedation Goal RASS -2 to -3 Will stop keppra Cont phenobarb,versed/propofol/dilauded    Perforated diverticulitis   - patent s/p colostomy     - surgery on case    CARDIAC ICU monitoring   GI GI PROPHYLAXIS as indicated   DIET-->TF's as tolerated Constipation protocol as indicated  ENDO - will use ICU hypoglycemic\Hyperglycemia protocol if indicated    ELECTROLYTES -follow labs as needed -replace as needed -pharmacy consultation and following   DVT/GI PRX ordered and assessed TRANSFUSIONS AS NEEDED MONITOR FSBS I Assessed the need for Labs I Assessed the need for Foley I Assessed the need for Central Venous Line Family Discussion when available I Assessed the need for Mobilization I made an Assessment of medications to be adjusted accordingly Safety Risk assessment completed   [facility] ICU TEAM  ENT consulted for [place] Time devoted to patient care services described in this note is 35  minutes.   Overall, patient is critically ill, prognosis is guarded.  Patient with Multiorgan failure and at high risk for cardiac arrest and death.   Rosine Door, MD  2020/09/17 9:16 AM [facility]

## 2020-09-06 ENCOUNTER — Encounter
Admission: EM | Disposition: A | Discharge status: Other Acute Inpatient Hospital | Payer: Self-pay | Source: Home / Self Care | Attending: Internal Medicine

## 2020-09-06 ENCOUNTER — Inpatient Hospital Stay: Payer: No Typology Code available for payment source

## 2020-09-06 DIAGNOSIS — U071 COVID-19: Secondary | ICD-10-CM | POA: Diagnosis not present

## 2020-09-06 DIAGNOSIS — R5081 Fever presenting with conditions classified elsewhere: Secondary | ICD-10-CM

## 2020-09-06 DIAGNOSIS — G9341 Metabolic encephalopathy: Secondary | ICD-10-CM | POA: Diagnosis not present

## 2020-09-06 DIAGNOSIS — R509 Fever, unspecified: Secondary | ICD-10-CM

## 2020-09-06 DIAGNOSIS — J9601 Acute respiratory failure with hypoxia: Secondary | ICD-10-CM | POA: Diagnosis not present

## 2020-09-06 LAB — BLOOD CULTURE ID PANEL (REFLEXED) - BCID2

## 2020-09-06 LAB — CBC WITH DIFFERENTIAL/PLATELET
Abs Immature Granulocytes: 0.4 10*3/uL — ABNORMAL HIGH (ref 0.00–0.07)
Basophils Absolute: 0 10*3/uL (ref 0.0–0.1)
Basophils Relative: 0 %
Eosinophils Absolute: 0 10*3/uL (ref 0.0–0.5)
Eosinophils Relative: 0 %
HCT: 43.4 % (ref 39.0–52.0)
Hemoglobin: 13.6 g/dL (ref 13.0–17.0)
Immature Granulocytes: 3 %
Lymphocytes Relative: 7 %
Lymphs Abs: 1.1 10*3/uL (ref 0.7–4.0)
MCH: 28.7 pg (ref 26.0–34.0)
MCHC: 31.3 g/dL (ref 30.0–36.0)
MCV: 91.6 fL (ref 80.0–100.0)
Monocytes Absolute: 0.7 10*3/uL (ref 0.1–1.0)
Monocytes Relative: 4 %
Neutro Abs: 13.8 10*3/uL — ABNORMAL HIGH (ref 1.7–7.7)
Neutrophils Relative %: 86 %
Platelets: 130 10*3/uL — ABNORMAL LOW (ref 150–400)
RBC: 4.74 MIL/uL (ref 4.22–5.81)
RDW: 17.2 % — ABNORMAL HIGH (ref 11.5–15.5)
WBC: 16 10*3/uL — ABNORMAL HIGH (ref 4.0–10.5)
nRBC: 0 % (ref 0.0–0.2)

## 2020-09-06 LAB — RENAL FUNCTION PANEL
Albumin: 3.1 g/dL — ABNORMAL LOW (ref 3.5–5.0)
Anion gap: 15 (ref 5–15)
BUN: 34 mg/dL — ABNORMAL HIGH (ref 6–20)
CO2: 34 mmol/L — ABNORMAL HIGH (ref 22–32)
Calcium: 9 mg/dL (ref 8.9–10.3)
Chloride: 92 mmol/L — ABNORMAL LOW (ref 98–111)
Creatinine, Ser: 0.36 mg/dL — ABNORMAL LOW (ref 0.61–1.24)
GFR, Estimated: >60 mL/min (ref >60)
Glucose, Bld: 148 mg/dL — ABNORMAL HIGH (ref 70–99)
Phosphorus: 3.9 mg/dL (ref 2.5–4.6)
Potassium: 2.8 mmol/L — ABNORMAL LOW (ref 3.5–5.1)
Sodium: 141 mmol/L (ref 135–145)

## 2020-09-06 LAB — GLUCOSE, CAPILLARY
Glucose-Capillary: 106 mg/dL — ABNORMAL HIGH (ref 70–99)
Glucose-Capillary: 117 mg/dL — ABNORMAL HIGH (ref 70–99)
Glucose-Capillary: 134 mg/dL — ABNORMAL HIGH (ref 70–99)
Glucose-Capillary: 139 mg/dL — ABNORMAL HIGH (ref 70–99)
Glucose-Capillary: 164 mg/dL — ABNORMAL HIGH (ref 70–99)
Glucose-Capillary: 176 mg/dL — ABNORMAL HIGH (ref 70–99)

## 2020-09-06 LAB — PROCALCITONIN: Procalcitonin: 0.68 ng/mL

## 2020-09-06 LAB — C-REACTIVE PROTEIN: CRP: 14.1 mg/dL — ABNORMAL HIGH (ref <1.0)

## 2020-09-06 LAB — MAGNESIUM: Magnesium: 2.4 mg/dL (ref 1.7–2.4)

## 2020-09-06 LAB — FIBRIN DERIVATIVES D-DIMER (ARMC ONLY): Fibrin derivatives D-dimer (ARMC): 4895.47 ng/mL (FEU) — ABNORMAL HIGH (ref 0.00–499.00)

## 2020-09-06 LAB — FERRITIN: Ferritin: 1399 ng/mL — ABNORMAL HIGH (ref 24–336)

## 2020-09-06 LAB — TRIGLYCERIDES: Triglycerides: 588 mg/dL — ABNORMAL HIGH (ref <150)

## 2020-09-06 IMAGING — DX DG ABDOMEN 1V
1 series · 1 of 1 positions shown · non-contrast
Comparison: [DATE]

CLINICAL DATA: OG tube placement

EXAM:
ABDOMEN - 1 VIEW

[abdomen supine]
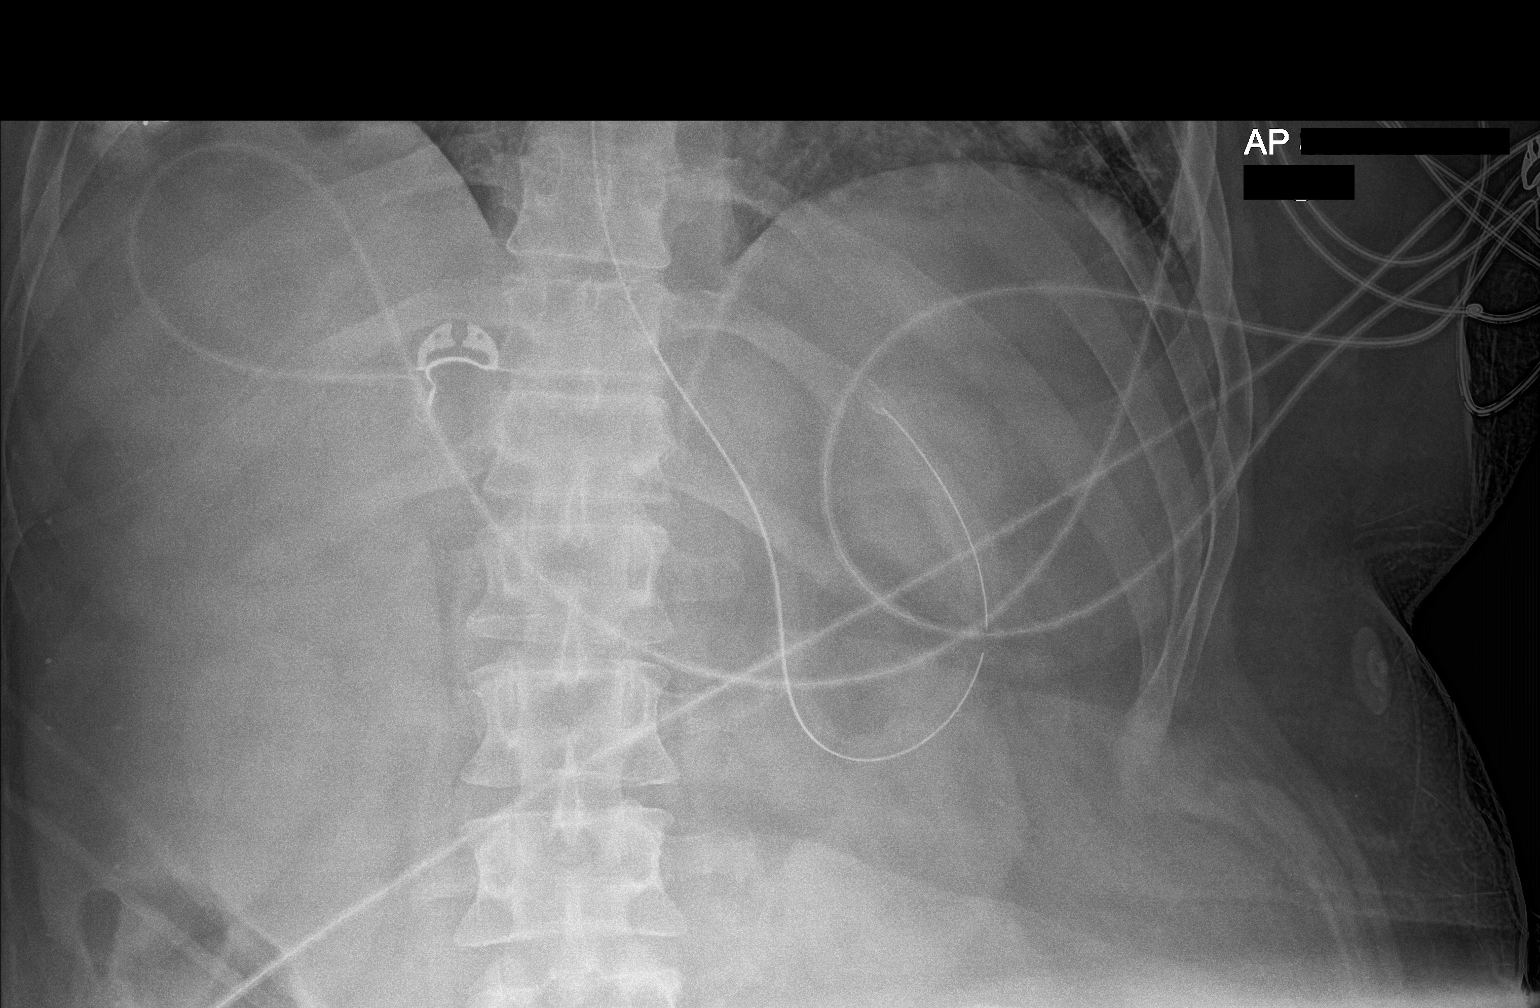

[1 of 1 positions shown; findings below may reference images not displayed]

FINDINGS: The enteric tube projects over the gastric body. The bowel gas
pattern is nonobstructive where visualized.
IMPRESSION: The enteric tube projects over the gastric body.

## 2020-09-06 IMAGING — DX DG CHEST 1V PORT
2 series · 2 of 2 positions shown · non-contrast
Comparison: [DATE]

CLINICAL DATA: Respiratory failure

EXAM:
PORTABLE CHEST 1 VIEW

[chest ap (1 of 2)]
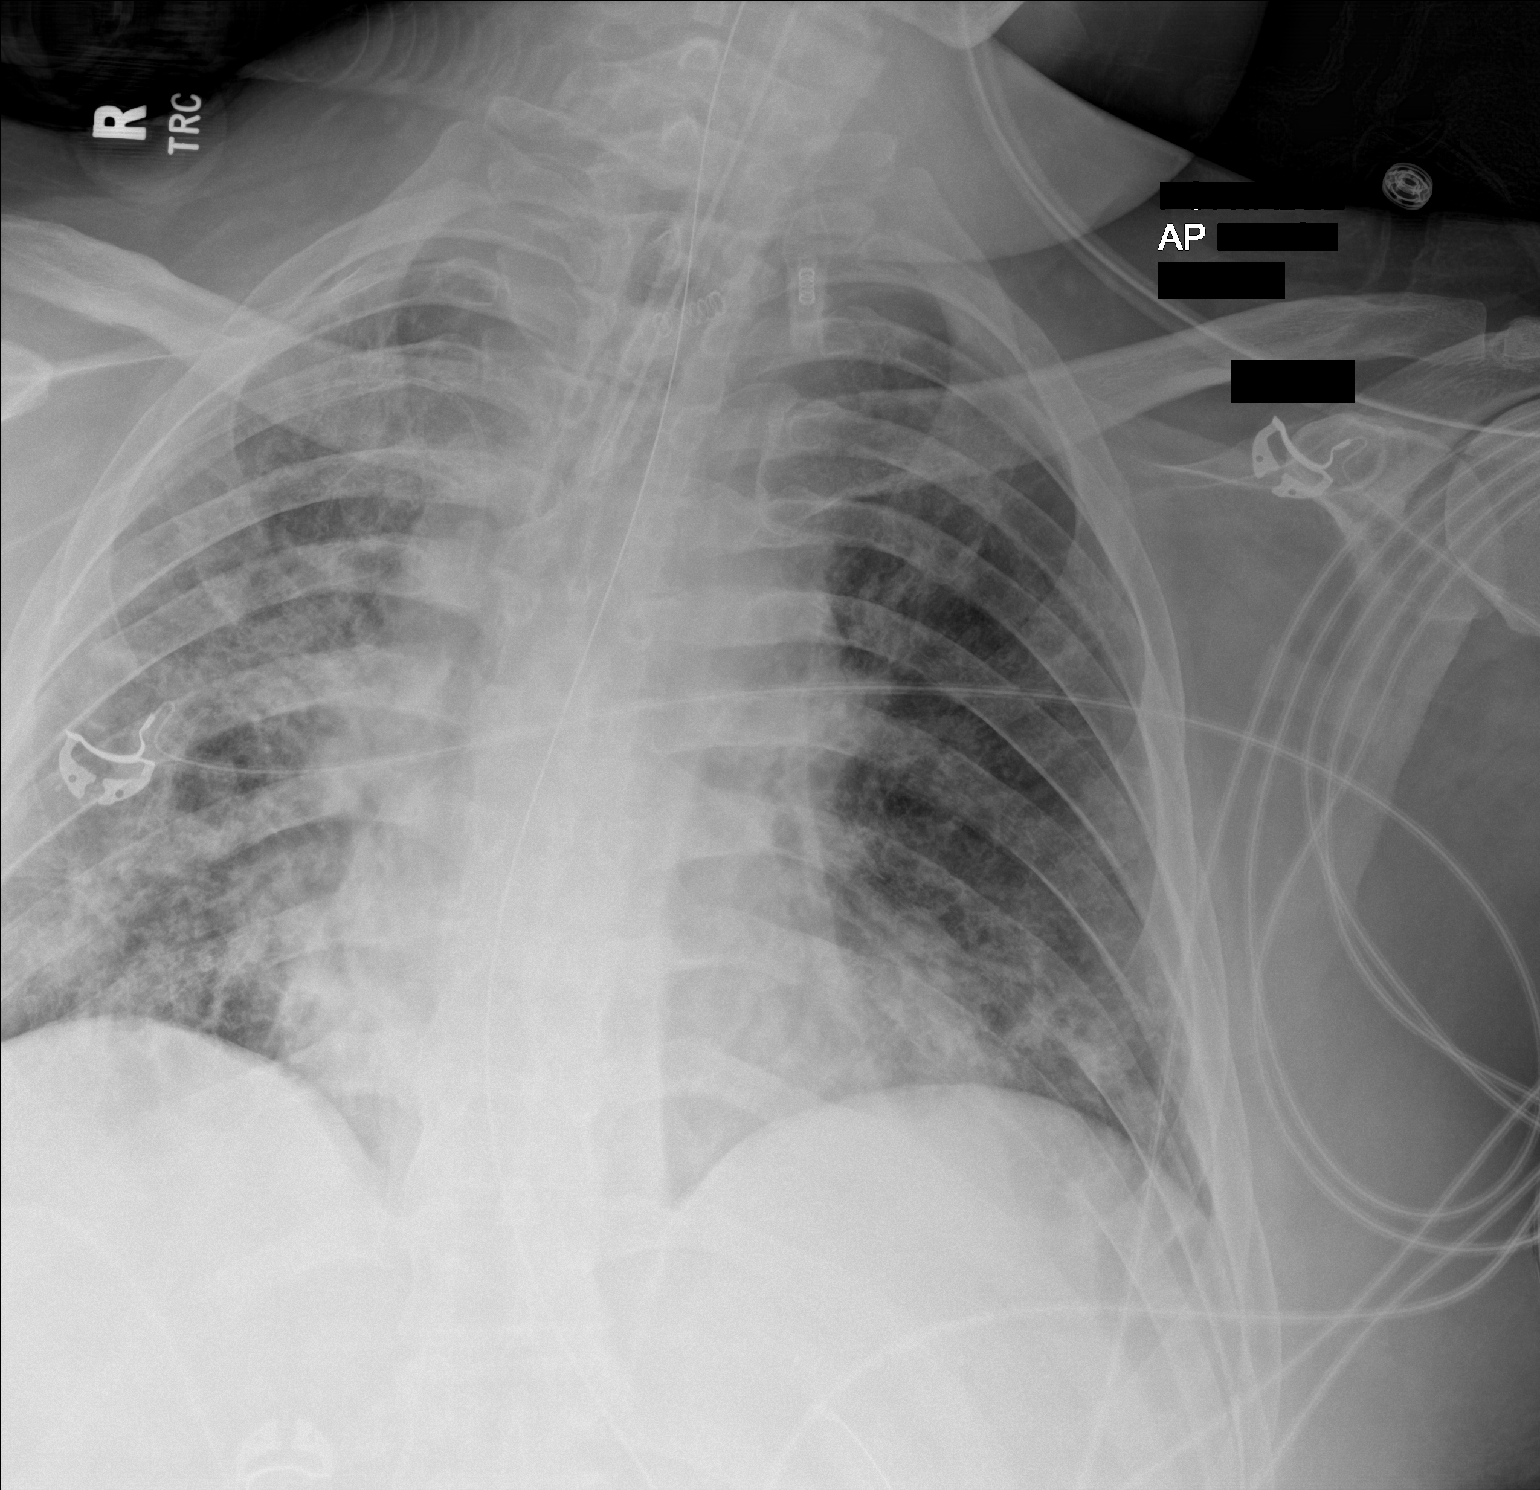

[chest ap (2 of 2)]
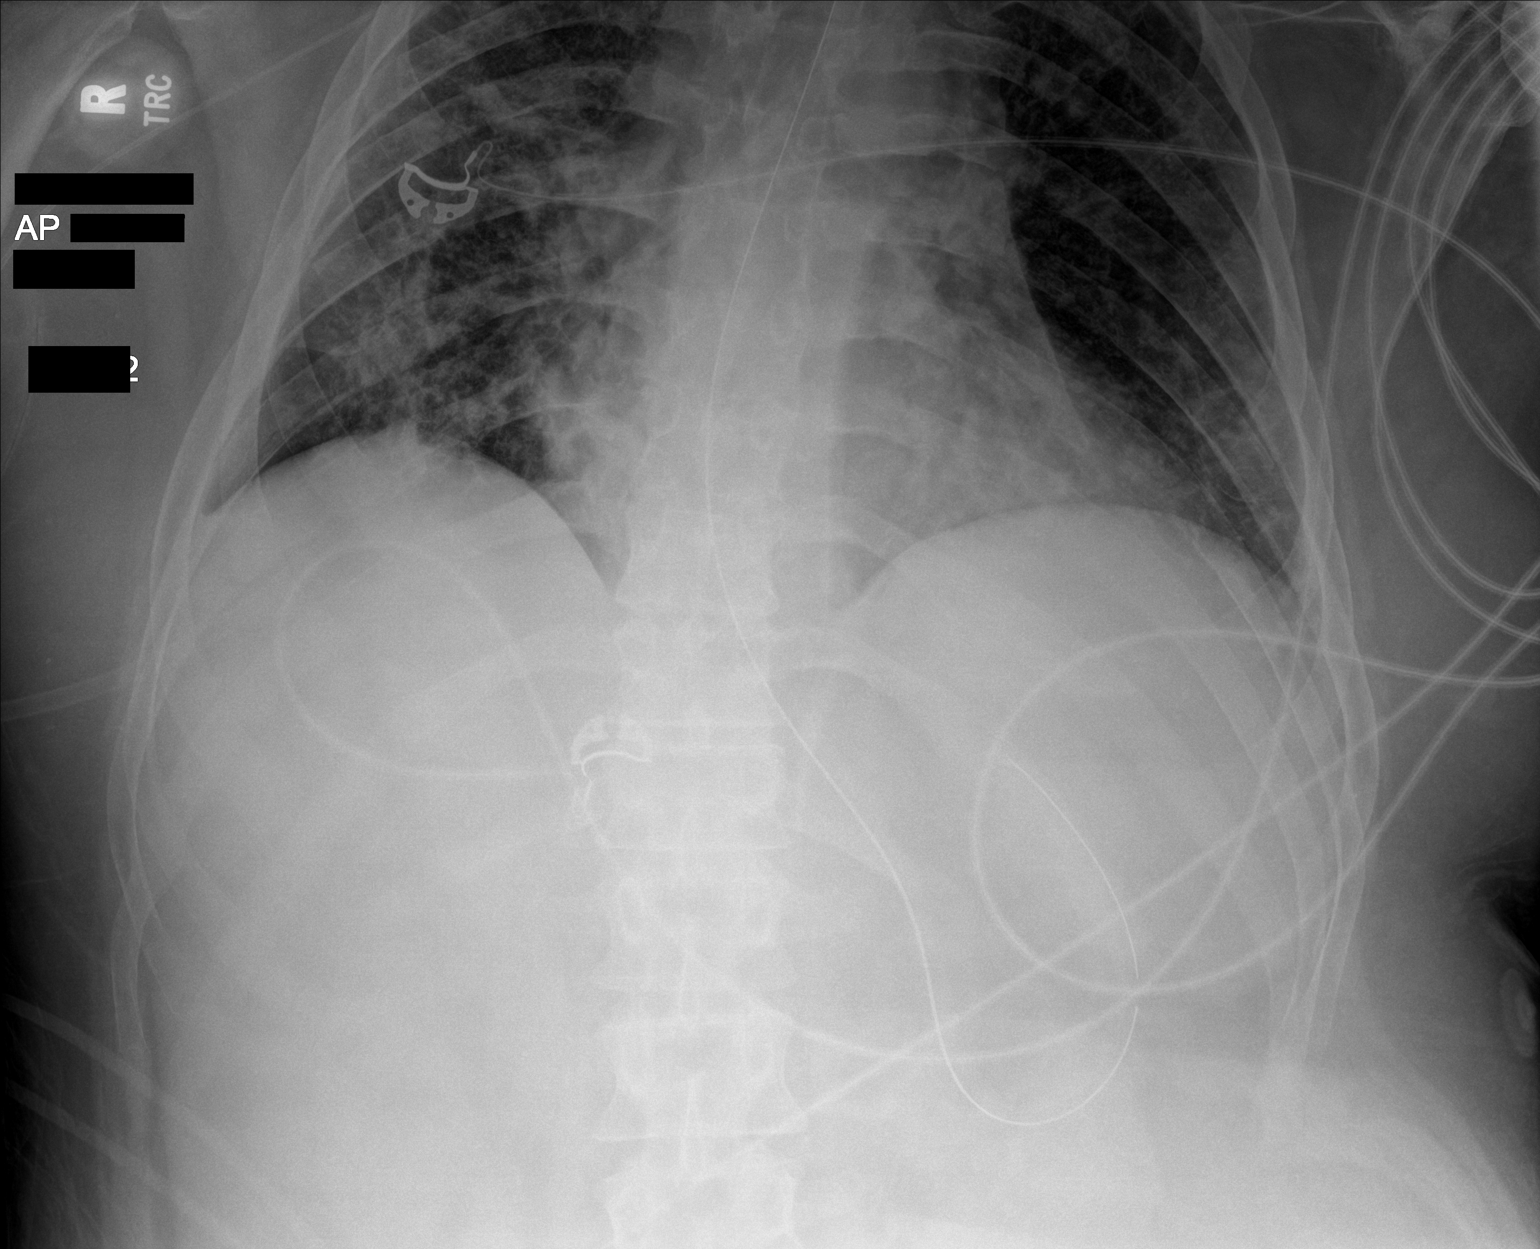

[2 of 2 positions shown; findings below may reference images not displayed]

FINDINGS: The endotracheal tube terminates above the carina. The right-sided
PICC line terminates over the SVC. The enteric tube extends below
the left hemidiaphragm. There are hazy bilateral airspace opacities.
No pneumothorax. No large pleural effusion. The heart size is
stable.
IMPRESSION: 1. Lines and tubes as above.
2. Hazy bilateral airspace opacities are again noted.

## 2020-09-06 SURGERY — CREATION, TRACHEOSTOMY
Anesthesia: General

## 2020-09-06 MED ORDER — FAMOTIDINE IN NACL 20-0.9 MG/50ML-% IV SOLN
20.0000 mg | Freq: Two times a day (BID) | INTRAVENOUS | Status: DC
  Administered 2020-09-06 – 2020-09-15 (×18): 20 mg via INTRAVENOUS
  Filled 2020-09-06 (×20): qty 50

## 2020-09-06 MED ORDER — ACETAMINOPHEN 10 MG/ML IV SOLN
1000.0000 mg | Freq: Four times a day (QID) | INTRAVENOUS | Status: AC
  Administered 2020-09-06 – 2020-09-07 (×4): 1000 mg via INTRAVENOUS
  Filled 2020-09-06 (×4): qty 100

## 2020-09-06 MED ORDER — OXYCODONE HCL 5 MG PO TABS
5.0000 mg | ORAL_TABLET | Freq: Four times a day (QID) | ORAL | Status: DC
Start: 2020-09-06 — End: 2020-09-16
  Administered 2020-09-07 – 2020-09-16 (×18): 5 mg
  Filled 2020-09-06 (×18): qty 1

## 2020-09-06 MED ORDER — INSULIN DETEMIR 100 UNIT/ML ~~LOC~~ SOLN
10.0000 [IU] | Freq: Two times a day (BID) | SUBCUTANEOUS | Status: DC
  Administered 2020-09-06 – 2020-09-09 (×5): 10 [IU] via SUBCUTANEOUS
  Filled 2020-09-06 (×9): qty 0.1

## 2020-09-06 MED ORDER — CEFAZOLIN SODIUM-DEXTROSE 2-4 GM/100ML-% IV SOLN
2.0000 g | Freq: Three times a day (TID) | INTRAVENOUS | Status: AC
  Administered 2020-09-07: 2 g via INTRAVENOUS
  Filled 2020-09-06: qty 100

## 2020-09-06 MED ORDER — MIDAZOLAM HCL 2 MG/2ML IJ SOLN
INTRAMUSCULAR | Status: AC
  Filled 2020-09-06: qty 2

## 2020-09-06 MED ORDER — IBUPROFEN 100 MG/5ML PO SUSP
600.0000 mg | Freq: Once | ORAL | Status: AC
  Administered 2020-09-06: 600 mg
  Filled 2020-09-06 (×4): qty 30

## 2020-09-06 MED ORDER — POTASSIUM CHLORIDE 10 MEQ/50ML IV SOLN
10.0000 meq | INTRAVENOUS | Status: AC
  Administered 2020-09-06 (×6): 10 meq via INTRAVENOUS
  Filled 2020-09-06 (×6): qty 50

## 2020-09-06 MED ORDER — SODIUM CHLORIDE 0.9 % IV SOLN
2.0000 g | Freq: Three times a day (TID) | INTRAVENOUS | Status: DC
  Administered 2020-09-06 (×3): 2 g via INTRAVENOUS
  Filled 2020-09-06 (×7): qty 2

## 2020-09-06 MED ORDER — MIDAZOLAM HCL 2 MG/2ML IJ SOLN
2.0000 mg | Freq: Once | INTRAMUSCULAR | Status: AC
  Administered 2020-09-06: 2 mg via INTRAVENOUS

## 2020-09-06 NOTE — Progress Notes (Signed)
Pharmacy Antibiotic Note  Craig [name] is a 30 y.o. male recent hospitalization for perforated diverticulitis s/p colostomy with reversal and leakage of anastomosis s/p revision now acutely hypoxemic with COVID19 induced severe ARDS  on 08/09/2020. Patient has been intubated since 12/25, current plan is for trach. Patient spiking fevers overnight 1/17-1/18 with increasing WBC. Pharmacy consulted to start cefepime.  Plan: Cefepime 2 g IV q8h  Height: 6' 0.01" (182.9 cm) Weight: 104 kg (229 lb 4.5 oz) IBW/kg (Calculated) : 77.62  Temp (24hrs), Avg:101.2 F (38.4 C), Min:98.6 F (37 C), Max:103.1 F (39.5 C)  Recent Labs  Lab 09/02/20 0405 09/03/20 0346 09/04/20 0448 09/04/20 0506 09/05/20 0438 09/05/20 0439 09/06/20 0415  WBC 14.8* 16.2*  --  19.9* 13.4*  --  16.0*  CREATININE 0.42* 0.47* 0.34*  --   --  <0.30* 0.36*    Estimated Creatinine Clearance: 166.9 mL/min (A) (by C-G formula based on SCr of 0.36 mg/dL (L)).    Allergies  Allergen Reactions  . Amoxicillin Rash    Antimicrobials this admission: Azithromycin 12/21 >> 12/23 Ceftriaxone 12/22 >> 12/27 Zosyn 12/27 >> 1/1 Vancomycin 12/27 x 1 Linezolid 12/28 >> 1/2 Cefepime 1/18 >>  Microbiology results: 01/18 Resp cx: pending 01/18 BCx: pending  12/21 BCx: NG final 12/22 UCx: NG final  12/27 Trach aspirate: normal flora  12/21 SARS CoV-2: positive 12/21 influenza A/B: negative 12/22 MRSA PCR: negative  Thank you for allowing pharmacy to be a part of this patient's care.  Tawnya Crook, PharmD Clinical Pharmacist 09/06/2020 1:27 PM

## 2020-09-06 NOTE — Progress Notes (Signed)
PHARMACY - PHYSICIAN COMMUNICATION CRITICAL VALUE ALERT - BLOOD CULTURE IDENTIFICATION (BCID)  Craig [name] is an 30 y.o. male who presented to [facility] on 08/09/2020 with a chief complaint of Covid PNA   Assessment:  Staph aureues, Mec A negative growing in 2 of 2 bottles.  (include suspected source if known)  Name of physician (or Provider) ContactedMelene Muller , NP   Current antibiotics: Cefepime   Changes to prescribed antibiotics recommended:  Recommendations accepted by provider   Will d/c cefepime and start cefazolin 2 gm IV Q8H  Results for orders placed or performed during the hospital encounter of 08/09/20  Blood Culture ID Panel (Reflexed) (Collected: 09/06/2020  9:35 AM)  Result Value Ref Range   Enterococcus faecalis NOT DETECTED NOT DETECTED   Enterococcus Faecium NOT DETECTED NOT DETECTED   Listeria monocytogenes NOT DETECTED NOT DETECTED   Staphylococcus species DETECTED (A) NOT DETECTED   Staphylococcus aureus (BCID) DETECTED (A) NOT DETECTED   Staphylococcus epidermidis NOT DETECTED NOT DETECTED   Staphylococcus lugdunensis NOT DETECTED NOT DETECTED   Streptococcus species NOT DETECTED NOT DETECTED   Streptococcus agalactiae NOT DETECTED NOT DETECTED   Streptococcus pneumoniae NOT DETECTED NOT DETECTED   Streptococcus pyogenes NOT DETECTED NOT DETECTED   A.calcoaceticus-baumannii NOT DETECTED NOT DETECTED   Bacteroides fragilis NOT DETECTED NOT DETECTED   Enterobacterales NOT DETECTED NOT DETECTED   Enterobacter cloacae complex NOT DETECTED NOT DETECTED   Escherichia coli NOT DETECTED NOT DETECTED   Klebsiella aerogenes NOT DETECTED NOT DETECTED   Klebsiella oxytoca NOT DETECTED NOT DETECTED   Klebsiella pneumoniae NOT DETECTED NOT DETECTED   Proteus species NOT DETECTED NOT DETECTED   Salmonella species NOT DETECTED NOT DETECTED   Serratia marcescens NOT DETECTED NOT DETECTED   Haemophilus influenzae NOT DETECTED NOT DETECTED   Neisseria  meningitidis NOT DETECTED NOT DETECTED   Pseudomonas aeruginosa NOT DETECTED NOT DETECTED   Stenotrophomonas maltophilia NOT DETECTED NOT DETECTED   Candida albicans NOT DETECTED NOT DETECTED   Candida auris NOT DETECTED NOT DETECTED   Candida glabrata NOT DETECTED NOT DETECTED   Candida krusei NOT DETECTED NOT DETECTED   Candida parapsilosis NOT DETECTED NOT DETECTED   Candida tropicalis NOT DETECTED NOT DETECTED   Cryptococcus neoformans/gattii NOT DETECTED NOT DETECTED   Meth resistant mecA/C and MREJ NOT DETECTED NOT DETECTED    [name] 09/06/2020  9:49 PM

## 2020-09-06 NOTE — Progress Notes (Signed)
CRITICAL CARE NOTE  30 yo male recent hospitalization for perforated diverticulitis s/p colostomy with reversal and leakage of anastomosis with s/p revision  Severe acute hypoxemic  resp failure with COVID19 induced severe ARDS.s/p inrubation/on ventilator  CC  follow up respiratory failure  SUBJECTIVE Patient remains critically ill Prognosis is guarded Spiked fever overnight and vomited Failed sedation vacation /SBT trial yesterday as after severeal he really didn't wake up and became tacypnic/tacycardic after several hrs of SBT.  SIGNIFICANT EVENTS  Fever Vomiting hypotension    BP 103/66   Pulse (!) 102   Temp (!) 101.9 F (38.8 C) (Axillary)   Resp (!) 26   Ht 6' 0.01" (1.829 m)   Wt 104 kg   SpO2 98%   BMI 31.09 kg/m    REVIEW OF SYSTEMS  PATIENT IS UNABLE TO PROVIDE COMPLETE REVIEW OF SYSTEMS DUE TO SEVERE CRITICAL ILLNESS   PHYSICAL EXAMINATION:  GENERAL:critically ill appearing, no resp distress HEAD: Normocephalic, atraumatic.  EYES: Pupils equal, round, reactive to light.  No scleral icterus.  MOUTH: Moist mucosal membrane. NECK: Supple. No thyromegaly. No nodules. No JVD.  PULMONARY: +rhonchi, +wheezing CARDIOVASCULAR: S1 and S2. Regular rate and rhythm. No murmurs, rubs, or gallops.  GASTROINTESTINAL: Soft, nontender, -distended. No masses. Positive bowel sounds. No hepatosplenomegaly.  MUSCULOSKELETAL: No swelling, clubbing, or edema.  NEUROLOGIC: intubated and sedated SKIN:intact,warm,dry  INTAKE/OUTPUT  Intake/Output Summary (Last 24 hours) at 09/06/2020 1039 Last data filed at 09/06/2020 0759 Gross per 24 hour  Intake 2434.96 ml  Output 2750 ml  Net -315.04 ml    LABS  CBC Recent Labs  Lab 09/04/20 0506 09/05/20 0438 09/06/20 0415  WBC 19.9* 13.4* 16.0*  HGB 13.4 11.1* 13.6  HCT 40.2 36.7* 43.4  PLT 139* 111* 130*   Coag's No results for input(s): APTT, INR in the last 168 hours. BMET Recent Labs  Lab 09/04/20 0448  09/05/20 0439 09/06/20 0415  NA 134* 139 141  K 4.0 3.7 2.8*  CL 88* 92* 92*  CO2 36* 39* 34*  BUN 31* 32* 34*  CREATININE 0.34* <0.30* 0.36*  GLUCOSE 139* 115* 148*   Electrolytes Recent Labs  Lab 09/04/20 0448 09/04/20 0506 09/05/20 0438 09/05/20 0439 09/06/20 0415  CALCIUM 8.7*  --   --  8.8* 9.0  MG  --  2.5* 2.4  --  2.4  PHOS 3.6 3.5  --  2.9 3.9   Sepsis Markers Recent Labs  Lab 09/05/20 0438 09/06/20 0415  PROCALCITON 0.63 0.68   ABG Recent Labs  Lab 09/02/20 0500 09/03/20 0500 09/03/20 0956  PHART 7.55* 7.51* 7.51*  PCO2ART 52* 54* 56*  PO2ART 63* 87 60*   Liver Enzymes Recent Labs  Lab 09/04/20 0506 09/05/20 0439 09/06/20 0415  AST 47*  --   --   ALT 48*  --   --   ALKPHOS 86  --   --   BILITOT 2.2*  --   --   ALBUMIN 3.1* 2.8* 3.1*   Cardiac Enzymes No results for input(s): TROPONINI, PROBNP in the last 168 hours. Glucose Recent Labs  Lab 09/05/20 1141 09/05/20 1611 09/05/20 1929 09/05/20 2326 09/06/20 0342 09/06/20 0823  GLUCAP 160* 109* 92 127* 164* 106*   Triglycerides 588 Ferritin 1399   KUB 1/18 images reviewed by me. Results as per rad FINDINGS: The enteric tube projects over the gastric body. The bowel gas pattern is nonobstructive where visualized.  IMPRESSION: The enteric tube projects over the gastric body.  CXR 09/06/20 images  reviewed by me   The endotracheal tube terminates above the carina. The right-sided PICC line terminates over the SVC. The enteric tube extends below the left hemidiaphragm. There are hazy bilateral airspace opacities. No pneumothorax. No large pleural effusion. The heart size is stable.  IMPRESSION: 1. Lines and tubes as above. 2. Hazy bilateral airspace opacities are again noted.  No results found for this or any previous visit (from the past 240 hour(s)).  MEDICATIONS   Current Facility-Administered Medications:  .  0.9 %  sodium chloride infusion, 250 mL, Intravenous,  PRN, Opyd, Ilene Qua, MD, Stopped at 08/22/20 0039 .  0.9 %  sodium chloride infusion, 250 mL, Intravenous, Continuous, Blakeney, Dana G, NP .  acetaminophen (OFIRMEV) IV 1,000 mg, 1,000 mg, Intravenous, Q6H, Selby Foisy, MD .  albuterol (VENTOLIN HFA) 108 (90 Base) MCG/ACT inhaler 2 puff, 2 puff, Inhalation, Q6H PRN, Opyd, Ilene Qua, MD .  allopurinol (ZYLOPRIM) tablet 300 mg, 300 mg, Per Tube, Daily, Lorella Nimrod, MD, 300 mg at 09/05/20 1105 .  ceFEPIme (MAXIPIME) 2 g in sodium chloride 0.9 % 100 mL IVPB, 2 g, Intravenous, Q8H, Kasa, Kurian, MD .  cetirizine (ZYRTEC) tablet 10 mg, 10 mg, Per Tube, Daily, Kasa, Kurian, MD, 10 mg at 09/05/20 1106 .  chlorhexidine gluconate (MEDLINE KIT) (PERIDEX) 0.12 % solution 15 mL, 15 mL, Mouth Rinse, BID, Lanney Gins, Fuad, MD, 15 mL at 09/06/20 0758 .  Chlorhexidine Gluconate Cloth 2 % PADS 6 each, 6 each, Topical, Daily, Ottie Glazier, MD, 6 each at 09/06/20 1008 .  dexmedetomidine (PRECEDEX) 400 MCG/100ML (4 mcg/mL) infusion, 0.4-1.2 mcg/kg/hr, Intravenous, Titrated, Rosine Door, MD, Last Rate: 25.6 mL/hr at 09/06/20 0759, 1 mcg/kg/hr at 09/06/20 0759 .  famotidine (PEPCID) IVPB 20 mg premix, 20 mg, Intravenous, Q12H, Rosine Door, MD .  feeding supplement (PROSource TF) liquid 45 mL, 45 mL, Per Tube, 5 X Daily, Kasa, Kurian, MD, 45 mL at 09/06/20 0526 .  feeding supplement (VITAL 1.5 CAL) liquid 1,000 mL, 1,000 mL, Per Tube, Continuous, Kasa, Kurian, MD, Last Rate: 65 mL/hr at 09/05/20 1103, 1,000 mL at 09/05/20 1103 .  fentaNYL (DURAGESIC) 100 MCG/HR 1 patch, 1 patch, Transdermal, Q72H, [name], MD, 1 patch at 09/03/20 1255 .  fentaNYL 258mcg in NS 249mL (54mcg/ml) infusion-PREMIX, 0-400 mcg/hr, Intravenous, Continuous, Rosine Door, MD, Last Rate: 20 mL/hr at 09/06/20 0759, 200 mcg/hr at 09/06/20 0759 .  free water 200 mL, 200 mL, Per Tube, Q4H, Candee Furbish, MD, 200 mL at 09/05/20 2329 .  hydrALAZINE (APRESOLINE) injection 20 mg, 20  mg, Intravenous, Q4H PRN, Rosine Door, MD, 20 mg at 09/05/20 1640 .  insulin aspart (novoLOG) injection 16 Units, 16 Units, Subcutaneous, Q4H, [name], MD, 6 Units at 09/06/20 0055 .  insulin aspart (novoLOG) injection 2-6 Units, 2-6 Units, Subcutaneous, Q4H, [name], MD, 4 Units at 09/06/20 0403 .  insulin detemir (LEVEMIR) injection 45 Units, 45 Units, Subcutaneous, Q12H, [name], MD, 45 Units at 09/05/20 2115 .  MEDLINE mouth rinse, 15 mL, Mouth Rinse, 10 times per day, Ottie Glazier, MD, 15 mL at 09/06/20 1000 .  methylPREDNISolone sodium succinate (SOLU-MEDROL) 40 mg/mL injection 40 mg, 40 mg, Intravenous, Daily, Rosine Door, MD, 40 mg at 09/06/20 0949 .  metoprolol tartrate (LOPRESSOR) injection 5 mg, 5 mg, Intravenous, 5 X Daily PRN, Rosine Door, MD, 5 mg at 09/06/20 0402 .  ondansetron (ZOFRAN) tablet 4 mg, 4 mg, Oral, Q6H PRN **OR** ondansetron (ZOFRAN) injection 4 mg, 4 mg, Intravenous, Q6H PRN,  Vianne Bulls, MD .  oxyCODONE (Oxy IR/ROXICODONE) immediate release tablet 5 mg, 5 mg, Per Tube, Q6H, Kasa, Kurian, MD .  PHENObarbital (LUMINAL) injection 65 mg, 65 mg, Intravenous, BID, Mortimer Fries, Kurian, MD, 65 mg at 09/14/2020 0950 .  phenylephrine CONCENTRATED 1822m in sodium chloride 0.9% 2520m(0.22m22mL) infusion, 0-400 mcg/min, Intravenous, Titrated, KeeDarel Hong NP, Last Rate: 5.25 mL/hr at 01/26-Jan-202259, 35 mcg/min at 01/January 26, 202259 .  potassium chloride 10 mEq in 50 mL *CENTRAL LINE* IVPB, 10 mEq, Intravenous, Q1 Hr x 6, KeeDarel Hong NP, Last Rate: 45 mL/hr at 01/01-26-202238, 10 mEq at 08/2020-08-2636 .  propofol (DIPRIVAN) 1000 MG/100ML infusion, 5-80 mcg/kg/min, Intravenous, Titrated, KeeDarel Hong NP, Last Rate: 40.4 mL/hr at 08/2020-08-2641, 60 mcg/kg/min at 08/20/24/202243 .  sodium chloride flush (NS) 0.9 % injection 10-40 mL, 10-40 mL, Intracatheter, Q12H, Kasa, Kurian, MD, 10 mL at 08/2020-08-2598      Indwelling Urinary Catheter continued,  requirement due to   Reason to continue Indwelling Urinary Catheter for strict Intake/Output monitoring for hemodynamic instability   Central Line continued, requirement due to   Reason to continue CenKinder Morgan Energynitoring of central venous pressure or other hemodynamic parameters   Ventilator continued, requirement due to, resp failure    Ventilator Sedation RASS 0 to -2     ASSESSMENT AND PLAN SYNOPSIS   Acute Hypoxic Respiratory Failurein the setting of COVID-19 Pneumonia with severe ARDS perforated diverticulitis s/p colostomy with reversal and leakage of anastomosis with s/p revision now acutely hypoxemic with COVID19 induced severe ARDS,complicated by delirium and agitation likely needs Needs Trach for Survival as gets very agitated Failed sedation vacation yesterday . No trial today due to fever/sepsis Cont phenobarb, propofol/fentanyl/precedex -continue Full MV support -continue Bronchodilator Therapy -Wean Fio2 and PEEP as tolerated -VAP/VENT bundle implementation Taper steroids -  New Fever/sepsis 1/12022/01/26lood and ETS cultured. Start cefepime , follow c/s,procal/wbc count Cont neosynephrine for hypotension,   ACUTE DIASTOLIC CARDIAC FAILURE- -oxygen as needed -Lasix as tolerated  Morbid obesity, possible OSA.  On vent, may ned PAP after extubation  AMS with encephalopathy Due to COVID19 and toxic metabolic encephalopathy - partially due to sedation Goal RASS -2 to -3 Cont phenobarb,/propofol/fentanyl. Follow triglycerides, may need to stop propfol    Perforated diverticulitis - patent s/p colostomy  - surgery on case    CARDIAC ICU monitoring   GI TF on hold due to vomiting GI PROPHYLAXIS as indicated   DIET-->TF's as tolerated Constipation protocol as indicated  ENDO - will use ICU hypoglycemic\Hyperglycemia protocol if indicated    ELECTROLYTES -follow labs as needed -replace as needed -pharmacy  consultation and following   DVT/GI PRX ordered and assessed TRANSFUSIONS AS NEEDED MONITOR FSBS I Assessed the need for Labs I Assessed the need for Foley I Assessed the need for Central Venous Line Family Discussion when available I Assessed the need for Mobilization I made an Assessment of medications to be adjusted accordingly Safety Risk assessment completed   CASE DISCUSSED IN MULTIDISCIPLINARY ROUNDS WITH ICU TEAM Wife at bedside, discussed in detail with her  Critical Care Time devoted to patient care services described in this note is [id number].   Overall, patient is critically ill, prognosis is guarded.  Patient with Multiorgan failure and at high risk for cardiac arrest and death.   KhalRosine Door  1/18January 26, 202239 AM [facility]

## 2020-09-06 NOTE — Progress Notes (Signed)
TF placed on hold due to tan secretions in mouth. Provider placed order for KUB to verify tube placement and to put OG tube on low intermittent suction.

## 2020-09-07 ENCOUNTER — Inpatient Hospital Stay (HOSPITAL_COMMUNITY)
Admit: 2020-09-07 | Discharge: 2020-09-07 | Disposition: A | Discharge status: Home / Self Care | Payer: No Typology Code available for payment source | Attending: Internal Medicine | Admitting: Internal Medicine

## 2020-09-07 DIAGNOSIS — J9601 Acute respiratory failure with hypoxia: Secondary | ICD-10-CM | POA: Diagnosis not present

## 2020-09-07 DIAGNOSIS — J15211 Pneumonia due to Methicillin susceptible Staphylococcus aureus: Secondary | ICD-10-CM

## 2020-09-07 DIAGNOSIS — G9341 Metabolic encephalopathy: Secondary | ICD-10-CM | POA: Diagnosis not present

## 2020-09-07 DIAGNOSIS — R7881 Bacteremia: Secondary | ICD-10-CM | POA: Diagnosis not present

## 2020-09-07 DIAGNOSIS — U071 COVID-19: Secondary | ICD-10-CM | POA: Diagnosis not present

## 2020-09-07 DIAGNOSIS — B9561 Methicillin susceptible Staphylococcus aureus infection as the cause of diseases classified elsewhere: Secondary | ICD-10-CM

## 2020-09-07 LAB — TRIGLYCERIDES
Triglycerides: 1477 mg/dL — ABNORMAL HIGH (ref <150)
Triglycerides: 394 mg/dL — ABNORMAL HIGH (ref <150)

## 2020-09-07 LAB — GLUCOSE, CAPILLARY
Glucose-Capillary: 104 mg/dL — ABNORMAL HIGH (ref 70–99)
Glucose-Capillary: 96 mg/dL (ref 70–99)
Glucose-Capillary: 104 mg/dL — ABNORMAL HIGH (ref 70–99)
Glucose-Capillary: 176 mg/dL — ABNORMAL HIGH (ref 70–99)
Glucose-Capillary: 130 mg/dL — ABNORMAL HIGH (ref 70–99)

## 2020-09-07 LAB — MAGNESIUM: Magnesium: 2.2 mg/dL (ref 1.7–2.4)

## 2020-09-07 LAB — CBC WITH DIFFERENTIAL/PLATELET
Abs Immature Granulocytes: 0.25 10*3/uL — ABNORMAL HIGH (ref 0.00–0.07)
Basophils Absolute: 0 10*3/uL (ref 0.0–0.1)
Basophils Relative: 0 %
Eosinophils Absolute: 0 10*3/uL (ref 0.0–0.5)
Eosinophils Relative: 0 %
HCT: 36 % — ABNORMAL LOW (ref 39.0–52.0)
Hemoglobin: 11.4 g/dL — ABNORMAL LOW (ref 13.0–17.0)
Immature Granulocytes: 2 %
Lymphocytes Relative: 8 %
Lymphs Abs: 0.9 10*3/uL (ref 0.7–4.0)
MCH: 29.6 pg (ref 26.0–34.0)
MCHC: 31.7 g/dL (ref 30.0–36.0)
MCV: 93.5 fL (ref 80.0–100.0)
Monocytes Absolute: 0.6 10*3/uL (ref 0.1–1.0)
Monocytes Relative: 5 %
Neutro Abs: 10.4 10*3/uL — ABNORMAL HIGH (ref 1.7–7.7)
Neutrophils Relative %: 85 %
Platelets: 99 10*3/uL — ABNORMAL LOW (ref 150–400)
RBC: 3.85 MIL/uL — ABNORMAL LOW (ref 4.22–5.81)
RDW: 17.6 % — ABNORMAL HIGH (ref 11.5–15.5)
WBC: 12.2 10*3/uL — ABNORMAL HIGH (ref 4.0–10.5)
nRBC: 0 % (ref 0.0–0.2)

## 2020-09-07 LAB — POTASSIUM: Potassium: 3.9 mmol/L (ref 3.5–5.1)

## 2020-09-07 LAB — FERRITIN: Ferritin: 1293 ng/mL — ABNORMAL HIGH (ref 24–336)

## 2020-09-07 LAB — ECHOCARDIOGRAM COMPLETE
Height: 72.008 in
S' Lateral: 2.32 cm
Weight: 3710.78 oz

## 2020-09-07 LAB — FIBRIN DERIVATIVES D-DIMER (ARMC ONLY): Fibrin derivatives D-dimer (ARMC): 3191.33 ng/mL (FEU) — ABNORMAL HIGH (ref 0.00–499.00)

## 2020-09-07 LAB — C-REACTIVE PROTEIN: CRP: 6.4 mg/dL — ABNORMAL HIGH (ref <1.0)

## 2020-09-07 MED ORDER — FENTANYL BOLUS VIA INFUSION
50.0000 ug | Freq: Once | INTRAVENOUS | Status: AC
Start: 2020-09-07 — End: 2020-09-07
  Administered 2020-09-07: 50 ug via INTRAVENOUS

## 2020-09-07 MED ORDER — ENOXAPARIN SODIUM 60 MG/0.6ML ~~LOC~~ SOLN
0.5000 mg/kg | SUBCUTANEOUS | Status: DC
  Administered 2020-09-07 – 2020-09-11 (×5): 52.5 mg via SUBCUTANEOUS
  Filled 2020-09-07 (×6): qty 0.6

## 2020-09-07 MED ORDER — FENTANYL BOLUS VIA INFUSION
50.0000 ug | Freq: Once | INTRAVENOUS | Status: AC
  Administered 2020-09-07: 50 ug via INTRAVENOUS
  Filled 2020-09-07: qty 50

## 2020-09-07 MED ORDER — IBUPROFEN 100 MG/5ML PO SUSP
400.0000 mg | Freq: Once | ORAL | Status: DC
  Filled 2020-09-07: qty 20

## 2020-09-07 MED ORDER — FENTANYL NICU BOLUS VIA INFUSION
50.0000 ug | Freq: Once | INTRAVENOUS | Status: DC
  Filled 2020-09-07: qty 5

## 2020-09-07 MED ORDER — LACTATED RINGERS IV SOLN: INTRAVENOUS | Status: DC

## 2020-09-07 MED ORDER — POTASSIUM CHLORIDE 20 MEQ PO PACK: 40.0000 meq | PACK | Freq: Once | ORAL | Status: DC

## 2020-09-07 MED ORDER — PROPOFOL BOLUS VIA INFUSION
5.0000 mg | Freq: Once | INTRAVENOUS | Status: AC
  Administered 2020-09-07: 5 mg via INTRAVENOUS
  Filled 2020-09-07: qty 5

## 2020-09-07 MED ORDER — CEFAZOLIN SODIUM-DEXTROSE 2-4 GM/100ML-% IV SOLN
2.0000 g | Freq: Three times a day (TID) | INTRAVENOUS | Status: DC
  Administered 2020-09-07 – 2020-09-12 (×17): 2 g via INTRAVENOUS
  Filled 2020-09-07 (×23): qty 100

## 2020-09-07 MED ORDER — PROPOFOL BOLUS VIA INFUSION: 5.0000 mg | Freq: Once | INTRAVENOUS | Status: DC

## 2020-09-07 MED ORDER — POTASSIUM CHLORIDE 10 MEQ/50ML IV SOLN
10.0000 meq | INTRAVENOUS | Status: DC
  Filled 2020-09-07 (×5): qty 50

## 2020-09-07 MED ORDER — POTASSIUM CHLORIDE 20 MEQ PO PACK
40.0000 meq | PACK | Freq: Once | ORAL | Status: AC
  Administered 2020-09-07: 40 meq
  Filled 2020-09-07: qty 2

## 2020-09-07 NOTE — Progress Notes (Signed)
*  PRELIMINARY RESULTS* Echocardiogram 2D Echocardiogram has been performed.  Craig [name] 09/07/2020, 1:29 PM

## 2020-09-07 NOTE — Progress Notes (Signed)
CRITICAL CARE NOTE 30 yo male recent hospitalization for perforated diverticulitis s/p colostomy with reversal and leakage of anastomosis with s/p revision Severe acutehypoxemic resp failurewith COVID19 induced severe ARDS.s/p inrubation/on ventilator   CC  follow up respiratory failure  SUBJECTIVE Patient remains critically ill Prognosis is guarded Intubated/sedated but opens eyes and tries to follow commands. Failed SBT this am     SIGNIFICANT EVENTS  MSSA bacteremia  BP (!) 70/46   Pulse (!) 102   Temp 100.22 F (37.9 C)   Resp 19   Ht 6' 0.01" (1.829 m)   Wt 105.2 kg   SpO2 99%   BMI 31.45 kg/m    REVIEW OF SYSTEMS  PATIENT IS UNABLE TO PROVIDE COMPLETE REVIEW OF SYSTEMS DUE TO SEVERE CRITICAL ILLNESS   PHYSICAL EXAMINATION:  GENERAL:critically ill appearing, +resp distress during SBT HEAD: Normocephalic, atraumatic.  EYES: Pupils equal, round, reactive to light.  No scleral icterus.  MOUTH: Moist mucosal membrane. NECK: Supple. No thyromegaly. No nodules. No JVD.  PULMONARY: +rhonchi, No wheezing CARDIOVASCULAR: S1 and S2. Regular rate and rhythm. No murmurs, rubs, or gallops.  GASTROINTESTINAL: Soft, nontender, -distended. No masses. Positive bowel sounds. No hepatosplenomegaly.  MUSCULOSKELETAL: No swelling, clubbing, or edema.  NEUROLOGIC:intubated/sedated but opens eyes and follows commands SKIN:intact,warm,dry  INTAKE/OUTPUT  Intake/Output Summary (Last 24 hours) at 09/07/2020 1217 Last data filed at 09/07/2020 1116 Gross per 24 hour  Intake 3241.48 ml  Output 1600 ml  Net 1641.48 ml    LABS  CBC Recent Labs  Lab 09/05/20 0438 09/06/20 0415 09/07/20 0415  WBC 13.4* 16.0* 12.2*  HGB 11.1* 13.6 11.4*  HCT 36.7* 43.4 36.0*  PLT 111* 130* 99*   Coag's No results for input(s): APTT, INR in the last 168 hours. BMET Recent Labs  Lab 09/05/20 0439 09/06/20 0415 09/06/20 2045 09/07/20 0415  NA 139 141  --  138  K 3.7 2.8* 3.9 3.1*   CL 92* 92*  --  98  CO2 39* 34*  --  28  BUN 32* 34*  --  24*  CREATININE <0.30* 0.36*  --  <0.30*  GLUCOSE 115* 148*  --  108*   Electrolytes Recent Labs  Lab 09/05/20 0438 09/05/20 0439 09/06/20 0415 09/07/20 0415  CALCIUM  --  8.8* 9.0 7.9*  MG 2.4  --  2.4 2.2  PHOS  --  2.9 3.9 3.1   Sepsis Markers Recent Labs  Lab 09/05/20 0438 09/06/20 0415  PROCALCITON 0.63 0.68   ABG Recent Labs  Lab 09/02/20 0500 09/03/20 0500 09/03/20 0956  PHART 7.55* 7.51* 7.51*  PCO2ART 52* 54* 56*  PO2ART 63* 87 60*   Liver Enzymes Recent Labs  Lab 09/04/20 0506 09/05/20 0439 09/06/20 0415 09/07/20 0415  AST 47*  --   --   --   ALT 48*  --   --   --   ALKPHOS 86  --   --   --   BILITOT 2.2*  --   --   --   ALBUMIN 3.1* 2.8* 3.1* 2.4*   Cardiac Enzymes No results for input(s): TROPONINI, PROBNP in the last 168 hours. Glucose Recent Labs  Lab 09/06/20 1627 09/06/20 1959 09/06/20 2355 09/07/20 0326 09/07/20 0746 09/07/20 1106  GLUCAP 176* 134* 117* 104* 96 104*     Recent Results (from the past 240 hour(s))  Culture, respiratory     Status: None (Preliminary result)   Collection Time: 09/06/20  6:27 AM   Specimen: Tracheal Aspirate; Respiratory  Result Value Ref Range Status   Specimen Description   Final    TRACHEAL ASPIRATE Performed at [facility], [address] [place], [place] [address]    Special Requests   Final    NONE Performed at [facility], [address] [place], [place] [address]    Gram Stain   Final    MODERATE WBC PRESENT,BOTH PMN AND MONONUCLEAR ABUNDANT GRAM POSITIVE COCCI IN PAIRS IN CLUSTERS Performed at [facility], [place], [place], [place] [address]    Culture MODERATE STAPHYLOCOCCUS AUREUS  Final   Report Status PENDING  Incomplete  CULTURE, BLOOD (ROUTINE X 2) w Reflex to ID Panel     Status: None (Preliminary result)   Collection Time: 09/06/20  9:35 AM   Specimen: BLOOD  Result Value  Ref Range Status   Specimen Description BLOOD LINE  Final   Special Requests   Final    BOTTLES DRAWN AEROBIC AND ANAEROBIC Blood Culture adequate volume   Culture  Setup Time   Final    Organism ID to follow GRAM POSITIVE COCCI IN BOTH AEROBIC AND ANAEROBIC BOTTLES CRITICAL RESULT CALLED TO, READ BACK BY AND VERIFIED WITH: JASON ROBINS _0  ON 09/16/20 SKL Performed at [facility], [address] [place], [place] [address]    Culture GRAM POSITIVE COCCI  Final   Report Status PENDING  Incomplete  Blood Culture ID Panel (Reflexed)     Status: Abnormal   Collection Time: 09/06/20  9:35 AM  Result Value Ref Range Status   Enterococcus faecalis NOT DETECTED NOT DETECTED Final   Enterococcus Faecium NOT DETECTED NOT DETECTED Final   Listeria monocytogenes NOT DETECTED NOT DETECTED Final   Staphylococcus species DETECTED (A) NOT DETECTED Final    Comment: CRITICAL RESULT CALLED TO, READ BACK BY AND VERIFIED WITH: JASON ROBINS _1  ON 09/06/20 SKL    Staphylococcus aureus (BCID) DETECTED (A) NOT DETECTED Final    Comment: CRITICAL RESULT CALLED TO, READ BACK BY AND VERIFIED WITH: JASON ROBINS _2  ON 09/06/20 SKL    Staphylococcus epidermidis NOT DETECTED NOT DETECTED Final   Staphylococcus lugdunensis NOT DETECTED NOT DETECTED Final   Streptococcus species NOT DETECTED NOT DETECTED Final   Streptococcus agalactiae NOT DETECTED NOT DETECTED Final   Streptococcus pneumoniae NOT DETECTED NOT DETECTED Final   Streptococcus pyogenes NOT DETECTED NOT DETECTED Final   A.calcoaceticus-baumannii NOT DETECTED NOT DETECTED Final   Bacteroides fragilis NOT DETECTED NOT DETECTED Final   Enterobacterales NOT DETECTED NOT DETECTED Final   Enterobacter cloacae complex NOT DETECTED NOT DETECTED Final   Escherichia coli NOT DETECTED NOT DETECTED Final   Klebsiella aerogenes NOT DETECTED NOT DETECTED Final   Klebsiella oxytoca NOT DETECTED NOT DETECTED Final   Klebsiella pneumoniae NOT  DETECTED NOT DETECTED Final   Proteus species NOT DETECTED NOT DETECTED Final   Salmonella species NOT DETECTED NOT DETECTED Final   Serratia marcescens NOT DETECTED NOT DETECTED Final   Haemophilus influenzae NOT DETECTED NOT DETECTED Final   Neisseria meningitidis NOT DETECTED NOT DETECTED Final   Pseudomonas aeruginosa NOT DETECTED NOT DETECTED Final   Stenotrophomonas maltophilia NOT DETECTED NOT DETECTED Final   Candida albicans NOT DETECTED NOT DETECTED Final   Candida auris NOT DETECTED NOT DETECTED Final   Candida glabrata NOT DETECTED NOT DETECTED Final   Candida krusei NOT DETECTED NOT DETECTED Final   Candida parapsilosis NOT DETECTED NOT DETECTED Final   Candida tropicalis NOT DETECTED NOT DETECTED Final  Cryptococcus neoformans/gattii NOT DETECTED NOT DETECTED Final   Meth resistant mecA/C and MREJ NOT DETECTED NOT DETECTED Final    Comment: Performed at [facility], [address] [place], [place] [address]  CULTURE, BLOOD (ROUTINE X 2) w Reflex to ID Panel     Status: None (Preliminary result)   Collection Time: 09/06/20 11:42 AM   Specimen: BLOOD  Result Value Ref Range Status   Specimen Description BLOOD LEFT HAND  Final   Special Requests   Final    BOTTLES DRAWN AEROBIC AND ANAEROBIC Blood Culture results may not be optimal due to an inadequate volume of blood received in culture bottles   Culture  Setup Time   Final    GRAM POSITIVE COCCI IN BOTH AEROBIC AND ANAEROBIC BOTTLES CRITICAL VALUE NOTED.  VALUE IS CONSISTENT WITH PREVIOUSLY REPORTED AND CALLED VALUE. Performed at [facility], [address] [place], [place] [address]    Culture [facility] POSITIVE COCCI  Final   Report Status PENDING  Incomplete    MEDICATIONS   Current Facility-Administered Medications:  .  0.9 %  sodium chloride infusion, 250 mL, Intravenous, PRN, Opyd, Ilene Qua, MD, Stopped at 08/22/20 0039 .  0.9 %  sodium chloride infusion, 250 mL, Intravenous, Continuous,  Blakeney, Dana G, NP .  albuterol (VENTOLIN HFA) 108 (90 Base) MCG/ACT inhaler 2 puff, 2 puff, Inhalation, Q6H PRN, Opyd, Ilene Qua, MD .  allopurinol (ZYLOPRIM) tablet 300 mg, 300 mg, Per Tube, Daily, Lorella Nimrod, MD, 300 mg at 09/05/20 1105 .  ceFAZolin (ANCEF) IVPB 2g/100 mL premix, 2 g, Intravenous, Q8H, Kasa, Kurian, MD .  chlorhexidine gluconate (MEDLINE KIT) (PERIDEX) 0.12 % solution 15 mL, 15 mL, Mouth Rinse, BID, Lanney Gins, Fuad, MD, 15 mL at 09/07/20 0721 .  Chlorhexidine Gluconate Cloth 2 % PADS 6 each, 6 each, Topical, Daily, Ottie Glazier, MD, 6 each at 09/06/20 1008 .  dexmedetomidine (PRECEDEX) 400 MCG/100ML (4 mcg/mL) infusion, 0.4-1.2 mcg/kg/hr, Intravenous, Titrated, Rosine Door, MD, Last Rate: 30.7 mL/hr at 09/07/20 1116, 1.2 mcg/kg/hr at 09/07/20 1116 .  famotidine (PEPCID) IVPB 20 mg premix, 20 mg, Intravenous, Q12H, Rosine Door, MD, Stopped at 09/07/20 1008 .  feeding supplement (PROSource TF) liquid 45 mL, 45 mL, Per Tube, 5 X Daily, Kasa, Kurian, MD, 45 mL at 09/06/20 0526 .  feeding supplement (VITAL 1.5 CAL) liquid 1,000 mL, 1,000 mL, Per Tube, Continuous, Kasa, Kurian, MD, Last Rate: 65 mL/hr at 09/05/20 1103, 1,000 mL at 09/05/20 1103 .  fentaNYL (DURAGESIC) 100 MCG/HR 1 patch, 1 patch, Transdermal, Q72H, [name], MD, 1 patch at 09/06/20 1240 .  fentaNYL 2563mg in NS 2571m(1061mml) infusion-PREMIX, 0-400 mcg/hr, Intravenous, Continuous, BasRosine DoorD, Last Rate: 30 mL/hr at 09/07/20 1116, 300 mcg/hr at 09/07/20 1116 .  free water 200 mL, 200 mL, Per Tube, Q4H, SmiCandee FurbishD, 200 mL at 09/05/20 2329 .  hydrALAZINE (APRESOLINE) injection 20 mg, 20 mg, Intravenous, Q4H PRN, BasRosine DoorD, 20 mg at 09/05/20 1640 .  insulin aspart (novoLOG) injection 2-6 Units, 2-6 Units, Subcutaneous, Q4H, KasFlora LippsD, 2 Units at 09/06/20 2050 .  insulin detemir (LEVEMIR) injection 10 Units, 10 Units, Subcutaneous, Q12H, BasRosine DoorD, 10 Units at  09/07/20 1021 .  MEDLINE mouth rinse, 15 mL, Mouth Rinse, 10 times per day, AleOttie GlazierD, 15 mL at 09/07/20 1112 .  methylPREDNISolone sodium succinate (SOLU-MEDROL) 40 mg/mL injection 40 mg, 40 mg, Intravenous, Daily, BasRosine DoorD, 40 mg at 09/07/20 1144 .  metoprolol tartrate (LOPRESSOR) injection 5 mg, 5 mg, Intravenous, 5 X Daily PRN, Rosine Door, MD, 5 mg at 09/06/20 0402 .  ondansetron (ZOFRAN) tablet 4 mg, 4 mg, Oral, Q6H PRN **OR** ondansetron (ZOFRAN) injection 4 mg, 4 mg, Intravenous, Q6H PRN, Opyd, Timothy S, MD .  oxyCODONE (Oxy IR/ROXICODONE) immediate release tablet 5 mg, 5 mg, Per Tube, Q6H, Kasa, Kurian, MD .  PHENObarbital (LUMINAL) injection 65 mg, 65 mg, Intravenous, BID, Kasa, Kurian, MD, 65 mg at 09/07/20 1144 .  phenylephrine CONCENTRATED 1359m in sodium chloride 0.9% 2549m(0.59m26mL) infusion, 0-400 mcg/min, Intravenous, Titrated, KeeDarel Hong NP, Last Rate: 4.5 mL/hr at 09/07/20 1116, 30 mcg/min at 09/07/20 1116 .  potassium chloride 10 mEq in 50 mL *CENTRAL LINE* IVPB, 10 mEq, Intravenous, Q1 Hr x 5, Kasa, Kurian, MD .  propofol (DIPRIVAN) 1000 MG/100ML infusion, 5-80 mcg/kg/min, Intravenous, Titrated, KeeDarel Hong NP, Last Rate: 37 mL/hr at 09/07/20 1116, 55 mcg/kg/min at 09/07/20 1116 .  sodium chloride flush (NS) 0.9 % injection 10-40 mL, 10-40 mL, Intracatheter, Q12H, Kasa, Kurian, MD, 10 mL at 09/07/20 [id number]   Indwelling Urinary Catheter continued, requirement due to   Reason to continue Indwelling Urinary Catheter for strict Intake/Output monitoring for hemodynamic instability   Central Line continued, requirement due to   Reason to continue CenKinder Morgan Energynitoring of central venous pressure or other hemodynamic parameters   Ventilator continued, requirement due to, resp failure    Ventilator Sedation RASS 0 to -2     ASSESSMENT AND PLAN SYNOPSIS Acute Hypoxic Respiratory Failurein the setting of COVID-19 Pneumonia with  severe ARDS perforated diverticulitis s/p colostomy with reversal and leakage of anastomosis with s/p revision now acutely hypoxemic with COVID19 induced severe ARDS,complicated by delirium and agitation likely needsNeeds Trach for Survivalas gets very agitated Failed sedation vacation /SBT xple times  Cont phenobarb, propofol/fentanyl/precedex -continue Full MV support -continue Bronchodilator Therapy -Wean Fio2 and PEEP as tolerated -VAP/VENT bundle implementation discont steroids Awaits tracheostomy   SIMV/PS  500, 16, 40%,peep 5 -   MSSA pneumonia/Bacteremia 09/06/20 On IV Ancef Repeat blood c/s daily Remove PICC line. Get 2D echo    ACUTE DIASTOLIC CARDIAC FAILURE- -oxygen as needed -Lasix as tolerated  Morbid obesity, possible OSA.  On vent, may ned PAP after extubation  AMS with encephalopathy Due to COVID19 and toxic metabolic encephalopathy - partially due to sedation Goal RASS -2 to -3 Cont phenobarb,/propofol/fentanyl. Follow triglycerides, may need to stop propfol    Perforated diverticulitis - patent s/p colostomy  - surgery on case    CARDIAC ICU monitoring   GI Restart TF at low rate  GI PROPHYLAXIS as indicated   DIET-->TF's as tolerated Constipation protocol as indicated  ENDO - will use ICU hypoglycemic\Hyperglycemia protocol if indicated Cont insulin    ELECTROLYTES -follow labs as needed -replace as needed -pharmacy consultation and following   DVT/GI PRX ordered and assessed TRANSFUSIONS AS NEEDED MONITOR FSBS I Assessed the need for Labs I Assessed the need for Foley I Assessed the need for Central Venous Line Family Discussion when available I Assessed the need for Mobilization I made an Assessment of medications to be adjusted accordingly Safety Risk assessment completed   CASE DISCUSSED IN MULTIDISCIPLINARY ROUNDS WITH ICU TEAM Wife at bedside, discussed in detail with  her   Critical Care Time devoted to patient care services described in this note is 47  minutes.   Overall, patient is critically ill, prognosis is guarded.  Patient with  Multiorgan failure and at high risk for cardiac arrest and death.   Rosine Door, MD  09-08-2020 12:17 PM [facility]

## 2020-09-07 NOTE — Consult Note (Signed)
NAME: Craig BLIXT  DOB: [birth year]  MRN: [id number]  Date/Time: 09/07/2020 11:56 AM  REQUESTING PROVIDER: Dr.BAshir Subjective:  REASON FOR CONSULT: MSSA bacteremia and MSSA pneumonia ?pt intubated- chart reviewed Craig [name] is a 30 y.o. with a history of complicated perforated diverticultis presented to the ED on 12/21 with SOB, fever and weakness after testing positive for COVID on 26/71/24 Pt has a complicated history Was admitted on 11/22/19 with  Diverticulitis / colon perforation and underwent emergent partial colectomy with end colostomy. During that hospitalization he needed solumedrol for an acute gout attack. He was discharged on 12/01/19. He was readmitted in Nov 2021 and underwent colostomy take down ( robotic lap was attempted but was laparotomy), small bowel resection, parostomal hernia repair on 06/28/20. Was discharged on 07/03/20. He was readmitted on 07/08/20 with sepsis and CT showed pneumoperitoneum. He underwent laparotomy and partial colectomy with colostomy on 07/08/20 and discharged on 07/14/20.  He presented to the ED on 12/21 after testing positive for covid on 08/02/20 and feeling dizzy, fever of 103.4 and oxygen sats of 66% in the ED. BP was 85/60, temp 104.3, HR 120 and RR 33. He was started on remdisivir, decadron, azithromycin and heated high flo nasal ox. Was seen by pulmonary. Got intubated for resp failure on 08/13/20 Pt had the following antibiotics He spiked a fever on 1/18 and blood and resp culture sent CXr showed persistent multifocal pneumonia Culture is positive for MSSA and I am seeing the patient for the same  Antibiotic history Ceftriaxone 12/21>>12/26 Azithromycin 12/21>>12/23 Zosyn 12/27>>12/31 Vanco 12/27-1 dose Linezolid 12/28>>12/31 08/20/2020>>09/05/20 no antibiotics 1/18 one dose of cefepime 1/18 cefazolin>>  Micro  12/21 BC-NG 12/21 UC-NG 12/27 BC NG 1/18 BC- MSSA 1/18 Resp culture MSSA  LDA 12/23- Midline  left 08/13/20- ETT 08/27/20 PICC-rt 08/25/20 Foley  Past Medical History:  Diagnosis Date  . Gout   . Hypertension   . Rupture of bowel [facility])     Past Surgical History:  Procedure Laterality Date  . COLONOSCOPY WITH PROPOFOL N/A 05/18/2020   Procedure: COLONOSCOPY WITH PROPOFOL;  Surgeon: Benjamine Sprague, DO;  Location: [facility] ENDOSCOPY;  Service: General;  Laterality: N/A;  . COLOSTOMY N/A 11/22/2019   Procedure: COLOSTOMY;  Surgeon: Herbert Pun, MD;  Location: [facility] ORS;  Service: General;  Laterality: N/A;  . LAPAROTOMY N/A 11/22/2019   Procedure: EXPLORATORY LAPAROTOMY;  Surgeon: Herbert Pun, MD;  Location: [facility] ORS;  Service: General;  Laterality: N/A;  . LAPAROTOMY N/A 07/08/2020   Procedure: EXPLORATORY LAPAROTOMY;  Surgeon: Herbert Pun, MD;  Location: [facility] ORS;  Service: General;  Laterality: N/A;  . PARTIAL COLECTOMY N/A 11/22/2019   Procedure: PARTIAL COLECTOMY;  Surgeon: Herbert Pun, MD;  Location: [facility] ORS;  Service: General;  Laterality: N/A;  . XI ROBOTIC ASSISTED COLOSTOMY TAKEDOWN N/A 06/28/2020   Procedure: XI ROBOTIC ASSISTED COLOSTOMY TAKEDOWN CONVERTED TO OPEN PROCEDURE;  Surgeon: Herbert Pun, MD;  Location: [facility] ORS;  Service: General;  Laterality: N/A;    Social History   Socioeconomic History  . Marital status: Married    Spouse name: Not on file  . Number of children: Not on file  . Years of education: Not on file  . Highest education level: Not on file  Occupational History  . Not on file  Tobacco Use  . Smoking status: Never Smoker  . Smokeless tobacco: Current User    Types: Chew  Vaping Use  . Vaping Use: Never used  Substance and Sexual Activity  . Alcohol use:  Yes    Comment: pint to a fifth per day per patient . Patient reports he hasn't had anything to drink in three weeks.  . Drug use: Never  . Sexual activity: Yes    Birth control/protection: None  Other Topics Concern  . Not on file  Social History  Narrative  . Not on file   Social Determinants of Health   Financial Resource Strain: Not on file  Food Insecurity: Not on file  Transportation Needs: Not on file  Physical Activity: Not on file  Stress: Not on file  Social Connections: Not on file  Intimate Partner Violence: Not on file    Family History  Problem Relation Age of Onset  . Healthy Mother   . Healthy Father    Allergies  Allergen Reactions  . Amoxicillin Rash    ? Current Facility-Administered Medications  Medication Dose Route Frequency Provider Last Rate Last Admin  . 0.9 %  sodium chloride infusion  250 mL Intravenous PRN Opyd, Ilene Qua, MD   Stopped at 08/22/20 0039  . 0.9 %  sodium chloride infusion  250 mL Intravenous Continuous Awilda Bill, NP      . albuterol (VENTOLIN HFA) 108 (90 Base) MCG/ACT inhaler 2 puff  2 puff Inhalation Q6H PRN Opyd, Ilene Qua, MD      . allopurinol (ZYLOPRIM) tablet 300 mg  300 mg Per Tube Daily Lorella Nimrod, MD   300 mg at 09/05/20 1105  . ceFAZolin (ANCEF) IVPB 2g/100 mL premix  2 g Intravenous Q8H Kasa, Kurian, MD      . chlorhexidine gluconate (MEDLINE KIT) (PERIDEX) 0.12 % solution 15 mL  15 mL Mouth Rinse BID Ottie Glazier, MD   15 mL at 09/07/20 0721  . Chlorhexidine Gluconate Cloth 2 % PADS 6 each  6 each Topical Daily Ottie Glazier, MD   6 each at 09/06/20 1008  . dexmedetomidine (PRECEDEX) 400 MCG/100ML (4 mcg/mL) infusion  0.4-1.2 mcg/kg/hr Intravenous Titrated Rosine Door, MD 30.7 mL/hr at 09/07/20 1116 1.2 mcg/kg/hr at 09/07/20 1116  . famotidine (PEPCID) IVPB 20 mg premix  20 mg Intravenous Q12H Rosine Door, MD   Stopped at 09/07/20 1008  . feeding supplement (PROSource TF) liquid 45 mL  45 mL Per Tube 5 X Daily [name], MD   45 mL at 09/06/20 0526  . feeding supplement (VITAL 1.5 CAL) liquid 1,000 mL  1,000 mL Per Tube Continuous [name], MD 65 mL/hr at 09/05/20 1103 1,000 mL at 09/05/20 1103  . fentaNYL (DURAGESIC) 100 MCG/HR 1 patch  1  patch Transdermal Q72H [name], MD   1 patch at 09/06/20 1240  . fentaNYL 2557mg in NS 258m(1046mml) infusion-PREMIX  0-400 mcg/hr Intravenous Continuous BasRosine DoorD 30 mL/hr at 09/07/20 1116 300 mcg/hr at 09/07/20 1116  . free water 200 mL  200 mL Per Tube Q4H SmiCandee FurbishD   200 mL at 09/05/20 2329  . hydrALAZINE (APRESOLINE) injection 20 mg  20 mg Intravenous Q4H PRN BasRosine DoorD   20 mg at 09/05/20 1640  . insulin aspart (novoLOG) injection 2-6 Units  2-6 Units Subcutaneous Q4H KasFlora LippsD   2 Units at 09/06/20 2050  . insulin detemir (LEVEMIR) injection 10 Units  10 Units Subcutaneous Q12H BasRosine DoorD   10 Units at 09/07/20 1021  . MEDLINE mouth rinse  15 mL Mouth Rinse 10 times per day AleOttie GlazierD   15 mL at 09/07/20 1112  . methylPREDNISolone sodium  succinate (SOLU-MEDROL) 40 mg/mL injection 40 mg  40 mg Intravenous Daily Rosine Door, MD   40 mg at 09/07/20 1144  . metoprolol tartrate (LOPRESSOR) injection 5 mg  5 mg Intravenous 5 X Daily PRN Rosine Door, MD   5 mg at 09/06/20 0402  . ondansetron (ZOFRAN) tablet 4 mg  4 mg Oral Q6H PRN Opyd, Ilene Qua, MD       Or  . ondansetron (ZOFRAN) injection 4 mg  4 mg Intravenous Q6H PRN Opyd, Ilene Qua, MD      . oxyCODONE (Oxy IR/ROXICODONE) immediate release tablet 5 mg  5 mg Per Tube Q6H Kasa, Kurian, MD      . PHENObarbital (LUMINAL) injection 65 mg  65 mg Intravenous BID [name], MD   65 mg at 09/07/20 1144  . phenylephrine CONCENTRATED 158m in sodium chloride 0.9% 256m(0.67m39mL) infusion  0-400 mcg/min Intravenous Titrated KeeDarel Hong NP 4.5 mL/hr at 09/07/20 1116 30 mcg/min at 09/07/20 1116  . potassium chloride 10 mEq in 50 mL *CENTRAL LINE* IVPB  10 mEq Intravenous Q1 Hr x 5 Kasa, Kurian, MD      . propofol (DIPRIVAN) 1000 MG/100ML infusion  5-80 mcg/kg/min Intravenous Titrated KeeDarel Hong NP 37 mL/hr at 09/07/20 1116 55 mcg/kg/min at 09/07/20 1116  . sodium chloride  flush (NS) 0.9 % injection 10-40 mL  10-40 mL Intracatheter Q12H KasFlora LippsD   10 mL at 09/07/20 0924     Abtx:  Anti-infectives (From admission, onward)   Start     Dose/Rate Route Frequency Ordered Stop   09/07/20 1400  ceFAZolin (ANCEF) IVPB 2g/100 mL premix        2 g 200 mL/hr over 30 Minutes Intravenous Every 8 hours 09/07/20 0901     09/07/20 0500  ceFAZolin (ANCEF) IVPB 2g/100 mL premix        2 g 200 mL/hr over 30 Minutes Intravenous Every 8 hours 09/06/20 2148 09/07/20 0451   09/06/20 0915  ceFEPIme (MAXIPIME) 2 g in sodium chloride 0.9 % 100 mL IVPB  Status:  Discontinued        2 g 200 mL/hr over 30 Minutes Intravenous Every 8 hours 09/06/20 0826 09/06/20 2147   08/16/20 1800  linezolid (ZYVOX) IVPB 600 mg        600 mg 300 mL/hr over 60 Minutes Intravenous Every 12 hours 08/16/20 1357 08/19/20 2357   08/15/20 1400  piperacillin-tazobactam (ZOSYN) IVPB 3.375 g        3.375 g 12.5 mL/hr over 240 Minutes Intravenous Every 8 hours 08/15/20 1105 08/20/20 0145   08/15/20 1200  vancomycin (VANCOREADY) IVPB 2000 mg/400 mL        2,000 mg 200 mL/hr over 120 Minutes Intravenous  Once 08/15/20 1105 08/15/20 1411   08/13/20 1400  remdesivir 100 mg in sodium chloride 0.9 % 100 mL IVPB        100 mg 200 mL/hr over 30 Minutes Intravenous  Once 08/13/20 1248 08/13/20 1731   08/11/20 0100  cefTRIAXone (ROCEPHIN) 2 g in sodium chloride 0.9 % 100 mL IVPB  Status:  Discontinued        2 g 200 mL/hr over 30 Minutes Intravenous Every 24 hours 08/10/20 0859 08/15/20 1105   08/10/20 1000  remdesivir 100 mg in sodium chloride 0.9 % 100 mL IVPB       "Followed by" Linked Group Details   100 mg 200 mL/hr over 30 Minutes Intravenous Daily 08/09/20 2236 08/14/20 0959  08/10/20 0000  cefTRIAXone (ROCEPHIN) 2 g in sodium chloride 0.9 % 100 mL IVPB        2 g 200 mL/hr over 30 Minutes Intravenous  Once 08/09/20 2352 08/10/20 0125   08/09/20 2345  levofloxacin (LEVAQUIN) IVPB 750 mg  Status:   Discontinued        750 mg 100 mL/hr over 90 Minutes Intravenous  Once 08/09/20 2339 08/09/20 2351   08/09/20 2300  remdesivir 200 mg in sodium chloride 0.9% 250 mL IVPB       "Followed by" Linked Group Details   200 mg 580 mL/hr over 30 Minutes Intravenous Once 08/09/20 2236 08/10/20 0201   08/09/20 2300  azithromycin (ZITHROMAX) 500 mg in sodium chloride 0.9 % 250 mL IVPB        500 mg 250 mL/hr over 60 Minutes Intravenous Every 24 hours 08/10/20 0859 08/12/20 2259   08/09/20 2230  azithromycin (ZITHROMAX) 500 mg in sodium chloride 0.9 % 250 mL IVPB        500 mg 250 mL/hr over 60 Minutes Intravenous  Once 08/09/20 2227 08/10/20 0038     ROS Not available Objective:  VITALS:  BP (!) 70/46   Pulse (!) 102   Temp 100.22 F (37.9 C)   Resp 19   Ht 6' 0.01" (1.829 m)   Wt 105.2 kg   SpO2 99%   BMI 31.45 kg/m  PHYSICAL EXAM:  General: intubated, sedated, on pressor Head: Normocephalic, without obvious abnormality, atraumatic. Eyes: Conjunctivae clear, anicteric sclerae. Pupils are equal Neck: Supple, symmetrical, no adenopathy, thyroid: non tender no carotid bruit and no JVD. Back: did not examine. Lungs: b/l rhonchi, few crepts Heart:s1s2 Abdomen: Soft, lap scar- colostomy bag Extremities: edema Rt PICc, left midline Rt Skin: No rashes or lesions. Or bruising Plane warts left hand dorsal surface Lymph: Cervical, supraclavicular normal. Neurologic: cannot assess Foley catheter Pertinent Labs Lab Results CBC CBC Latest Ref Rng & Units 09/07/2020 09/06/2020 09/05/2020  WBC 4.0 - 10.5 K/uL 12.2(H) 16.0(H) 13.4(H)  Hemoglobin 13.0 - 17.0 g/dL 11.4(L) 13.6 11.1(L)  Hematocrit 39.0 - 52.0 % 36.0(L) 43.4 36.7(L)  Platelets 150 - 400 K/uL 99(L) 130(L) 111(L)      Component Value Date/Time   WBC 12.2 (H) 09/07/2020 0415   RBC 3.85 (L) 09/07/2020 0415   HGB 11.4 (L) 09/07/2020 0415   HCT 36.0 (L) 09/07/2020 0415   PLT 99 (L) 09/07/2020 0415   MCV 93.5 09/07/2020 0415    MCH 29.6 09/07/2020 0415   MCHC 31.7 09/07/2020 0415   RDW 17.6 (H) 09/07/2020 0415   LYMPHSABS 0.9 09/07/2020 0415   MONOABS 0.6 09/07/2020 0415   EOSABS 0.0 09/07/2020 0415   BASOSABS 0.0 09/07/2020 0415    CMP Latest Ref Rng & Units 09/07/2020 09/06/2020 09/06/2020  Glucose 70 - 99 mg/dL 108(H) - 148(H)  BUN 6 - 20 mg/dL 24(H) - 34(H)  Creatinine 0.61 - 1.24 mg/dL <0.30(L) - 0.36(L)  Sodium 135 - 145 mmol/L 138 - 141  Potassium 3.5 - 5.1 mmol/L 3.1(L) 3.9 2.8(L)  Chloride 98 - 111 mmol/L 98 - 92(L)  CO2 22 - 32 mmol/L 28 - 34(H)  Calcium 8.9 - 10.3 mg/dL 7.9(L) - 9.0  Total Protein 6.5 - 8.1 g/dL - - -  Total Bilirubin 0.3 - 1.2 mg/dL - - -  Alkaline Phos 38 - 126 U/L - - -  AST 15 - 41 U/L - - -  ALT 0 - 44 U/L - - -  Microbiology: Recent Results (from the past 240 hour(s))  Culture, respiratory     Status: None (Preliminary result)   Collection Time: 09/06/20  6:27 AM   Specimen: Tracheal Aspirate; Respiratory  Result Value Ref Range Status   Specimen Description   Final    TRACHEAL ASPIRATE Performed at [facility], [address] [place], [place] [address]    Special Requests   Final    NONE Performed at [facility], [address] [place], [place] [address]    Gram Stain   Final    MODERATE WBC PRESENT,BOTH PMN AND MONONUCLEAR ABUNDANT GRAM POSITIVE COCCI IN PAIRS IN CLUSTERS Performed at [facility], [place] [address] [place], [place] [address]    Culture MODERATE STAPHYLOCOCCUS AUREUS  Final   Report Status PENDING  Incomplete  CULTURE, BLOOD (ROUTINE X 2) w Reflex to ID Panel     Status: None (Preliminary result)   Collection Time: 09/06/20  9:35 AM   Specimen: BLOOD  Result Value Ref Range Status   Specimen Description BLOOD LINE  Final   Special Requests   Final    BOTTLES DRAWN AEROBIC AND ANAEROBIC Blood Culture adequate volume   Culture  Setup Time   Final    Organism ID to follow GRAM POSITIVE COCCI IN BOTH  AEROBIC AND ANAEROBIC BOTTLES CRITICAL RESULT CALLED TO, READ BACK BY AND VERIFIED WITH: JASON ROBINS _0  ON 09/16/20 SKL Performed at [facility], [address] [place], [place] [address]    Culture GRAM POSITIVE COCCI  Final   Report Status PENDING  Incomplete  Blood Culture ID Panel (Reflexed)     Status: Abnormal   Collection Time: 09/06/20  9:35 AM  Result Value Ref Range Status   Enterococcus faecalis NOT DETECTED NOT DETECTED Final   Enterococcus Faecium NOT DETECTED NOT DETECTED Final   Listeria monocytogenes NOT DETECTED NOT DETECTED Final   Staphylococcus species DETECTED (A) NOT DETECTED Final    Comment: CRITICAL RESULT CALLED TO, READ BACK BY AND VERIFIED WITH: JASON ROBINS _1  ON 09/06/20 SKL    Staphylococcus aureus (BCID) DETECTED (A) NOT DETECTED Final    Comment: CRITICAL RESULT CALLED TO, READ BACK BY AND VERIFIED WITH: JASON ROBINS _2  ON 09/06/20 SKL    Staphylococcus epidermidis NOT DETECTED NOT DETECTED Final   Staphylococcus lugdunensis NOT DETECTED NOT DETECTED Final   Streptococcus species NOT DETECTED NOT DETECTED Final   Streptococcus agalactiae NOT DETECTED NOT DETECTED Final   Streptococcus pneumoniae NOT DETECTED NOT DETECTED Final   Streptococcus pyogenes NOT DETECTED NOT DETECTED Final   A.calcoaceticus-baumannii NOT DETECTED NOT DETECTED Final   Bacteroides fragilis NOT DETECTED NOT DETECTED Final   Enterobacterales NOT DETECTED NOT DETECTED Final   Enterobacter cloacae complex NOT DETECTED NOT DETECTED Final   Escherichia coli NOT DETECTED NOT DETECTED Final   Klebsiella aerogenes NOT DETECTED NOT DETECTED Final   Klebsiella oxytoca NOT DETECTED NOT DETECTED Final   Klebsiella pneumoniae NOT DETECTED NOT DETECTED Final   Proteus species NOT DETECTED NOT DETECTED Final   Salmonella species NOT DETECTED NOT DETECTED Final   Serratia marcescens NOT DETECTED NOT DETECTED Final   Haemophilus influenzae NOT DETECTED NOT DETECTED Final    Neisseria meningitidis NOT DETECTED NOT DETECTED Final   Pseudomonas aeruginosa NOT DETECTED NOT DETECTED Final   Stenotrophomonas maltophilia NOT DETECTED NOT DETECTED Final   Candida albicans NOT DETECTED NOT DETECTED Final   Candida auris NOT DETECTED NOT DETECTED Final   Candida glabrata  NOT DETECTED NOT DETECTED Final   Candida krusei NOT DETECTED NOT DETECTED Final   Candida parapsilosis NOT DETECTED NOT DETECTED Final   Candida tropicalis NOT DETECTED NOT DETECTED Final   Cryptococcus neoformans/gattii NOT DETECTED NOT DETECTED Final   Meth resistant mecA/C and MREJ NOT DETECTED NOT DETECTED Final    Comment: Performed at [facility], [address] [place], [place] [address]  CULTURE, BLOOD (ROUTINE X 2) w Reflex to ID Panel     Status: None (Preliminary result)   Collection Time: 09/06/20 11:42 AM   Specimen: BLOOD  Result Value Ref Range Status   Specimen Description BLOOD LEFT HAND  Final   Special Requests   Final    BOTTLES DRAWN AEROBIC AND ANAEROBIC Blood Culture results may not be optimal due to an inadequate volume of blood received in culture bottles   Culture  Setup Time   Final    GRAM POSITIVE COCCI IN BOTH AEROBIC AND ANAEROBIC BOTTLES CRITICAL VALUE NOTED.  VALUE IS CONSISTENT WITH PREVIOUSLY REPORTED AND CALLED VALUE. Performed at [facility] [place] [facility], [address], [place], [place] [address]    Culture [facility] POSITIVE COCCI  Final   Report Status PENDING  Incomplete    IMAGING RESULTS: 12/21   09/06/20  I have personally reviewed the films ? Impression/Recommendation ? ?MSSA bacteremia with MSSA multifocal pneumonia PT has a PICC line and midline- both will have to be removed Will repeat blood culture and if negative, can get a central line Need 2 d echo  Acute hypoxic resp failure due to COVID- intubated Since 28/90/22  Complicated colon surgery  Perforated diverticulitis in April 2021 leading to urgent colectomy and end  ostomy Take down of ostomy In Nov 8406 complicated by dehiscence , needing partial colectomy and colostomy 10 days later on 07/08/20  Anemia  Hypoalbuminemia   Discussed the management with care team  Note:  This document was prepared using Dragon voice recognition software and may include unintentional dictation errors.

## 2020-09-08 ENCOUNTER — Inpatient Hospital Stay: Payer: No Typology Code available for payment source

## 2020-09-08 DIAGNOSIS — U071 COVID-19: Secondary | ICD-10-CM | POA: Diagnosis not present

## 2020-09-08 DIAGNOSIS — E8809 Other disorders of plasma-protein metabolism, not elsewhere classified: Secondary | ICD-10-CM

## 2020-09-08 DIAGNOSIS — R7881 Bacteremia: Secondary | ICD-10-CM | POA: Diagnosis not present

## 2020-09-08 DIAGNOSIS — J9601 Acute respiratory failure with hypoxia: Secondary | ICD-10-CM | POA: Diagnosis not present

## 2020-09-08 DIAGNOSIS — J15211 Pneumonia due to Methicillin susceptible Staphylococcus aureus: Secondary | ICD-10-CM | POA: Diagnosis not present

## 2020-09-08 DIAGNOSIS — D649 Anemia, unspecified: Secondary | ICD-10-CM

## 2020-09-08 LAB — CBC WITH DIFFERENTIAL/PLATELET
Abs Immature Granulocytes: 0.14 10*3/uL — ABNORMAL HIGH (ref 0.00–0.07)
Basophils Absolute: 0 10*3/uL (ref 0.0–0.1)
Basophils Relative: 0 %
Eosinophils Absolute: 0 10*3/uL (ref 0.0–0.5)
Eosinophils Relative: 0 %
HCT: 36.5 % — ABNORMAL LOW (ref 39.0–52.0)
Hemoglobin: 11.4 g/dL — ABNORMAL LOW (ref 13.0–17.0)
Immature Granulocytes: 1 %
Lymphocytes Relative: 9 %
Lymphs Abs: 1.2 10*3/uL (ref 0.7–4.0)
MCH: 28.5 pg (ref 26.0–34.0)
MCHC: 31.2 g/dL (ref 30.0–36.0)
MCV: 91.3 fL (ref 80.0–100.0)
Monocytes Absolute: 0.6 10*3/uL (ref 0.1–1.0)
Monocytes Relative: 4 %
Neutro Abs: 11.2 10*3/uL — ABNORMAL HIGH (ref 1.7–7.7)
Neutrophils Relative %: 86 %
Platelets: 113 10*3/uL — ABNORMAL LOW (ref 150–400)
RBC: 4 MIL/uL — ABNORMAL LOW (ref 4.22–5.81)
RDW: 16.8 % — ABNORMAL HIGH (ref 11.5–15.5)
WBC: 13.1 10*3/uL — ABNORMAL HIGH (ref 4.0–10.5)
nRBC: 0 % (ref 0.0–0.2)

## 2020-09-08 LAB — GLUCOSE, CAPILLARY
Glucose-Capillary: 100 mg/dL — ABNORMAL HIGH (ref 70–99)
Glucose-Capillary: 65 mg/dL — ABNORMAL LOW (ref 70–99)
Glucose-Capillary: 80 mg/dL (ref 70–99)
Glucose-Capillary: 93 mg/dL (ref 70–99)
Glucose-Capillary: 93 mg/dL (ref 70–99)
Glucose-Capillary: 98 mg/dL (ref 70–99)

## 2020-09-08 LAB — BLOOD GAS, ARTERIAL
Acid-Base Excess: 6.3 mmol/L — ABNORMAL HIGH (ref 0.0–2.0)
Bicarbonate: 29.4 mmol/L — ABNORMAL HIGH (ref 20.0–28.0)
FIO2: 0.5
O2 Saturation: 92.6 %
Patient temperature: 37
pCO2 arterial: 36 mmHg (ref 32.0–48.0)
pH, Arterial: 7.52 — ABNORMAL HIGH (ref 7.350–7.450)
pO2, Arterial: 58 mmHg — ABNORMAL LOW (ref 83.0–108.0)

## 2020-09-08 LAB — RENAL FUNCTION PANEL
Albumin: 2.7 g/dL — ABNORMAL LOW (ref 3.5–5.0)
Anion gap: 13 (ref 5–15)
BUN: 15 mg/dL (ref 6–20)
CO2: 25 mmol/L (ref 22–32)
Calcium: 8.1 mg/dL — ABNORMAL LOW (ref 8.9–10.3)
Chloride: 102 mmol/L (ref 98–111)
Creatinine, Ser: <0.3 mg/dL — ABNORMAL LOW (ref 0.61–1.24)
Glucose, Bld: 103 mg/dL — ABNORMAL HIGH (ref 70–99)
Phosphorus: 1.8 mg/dL — ABNORMAL LOW (ref 2.5–4.6)
Potassium: 3 mmol/L — ABNORMAL LOW (ref 3.5–5.1)
Sodium: 140 mmol/L (ref 135–145)

## 2020-09-08 LAB — CULTURE, RESPIRATORY W GRAM STAIN

## 2020-09-08 LAB — TRIGLYCERIDES: Triglycerides: 307 mg/dL — ABNORMAL HIGH (ref <150)

## 2020-09-08 LAB — MAGNESIUM: Magnesium: 1.7 mg/dL (ref 1.7–2.4)

## 2020-09-08 IMAGING — US US EXTREM LOW VENOUS
1 series · 13 of 24 positions shown · non-contrast
Comparison: None.

CLINICAL DATA: Bacteremia, colonic perforation, COVID pneumonia and
respiratory failure. Lower extremity discoloration.



[Series 1: us venous img lower bilat (dvt) · portal-venous · 13 of 63 slices shown]
[im 1/63]
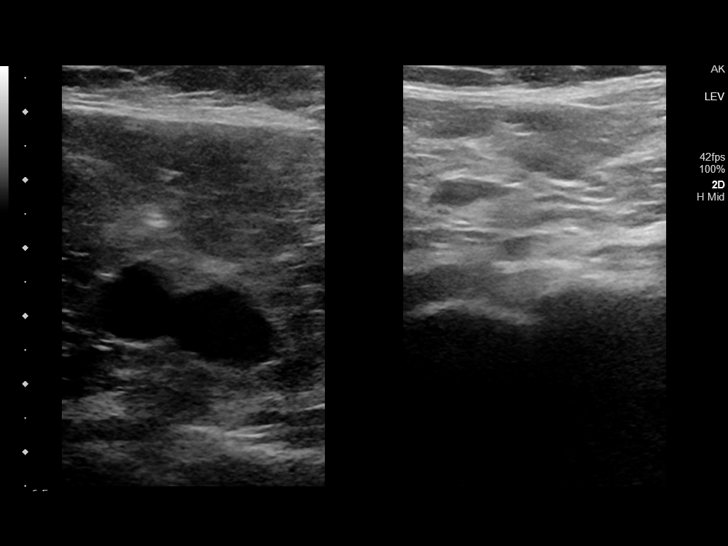
[im 6/63]
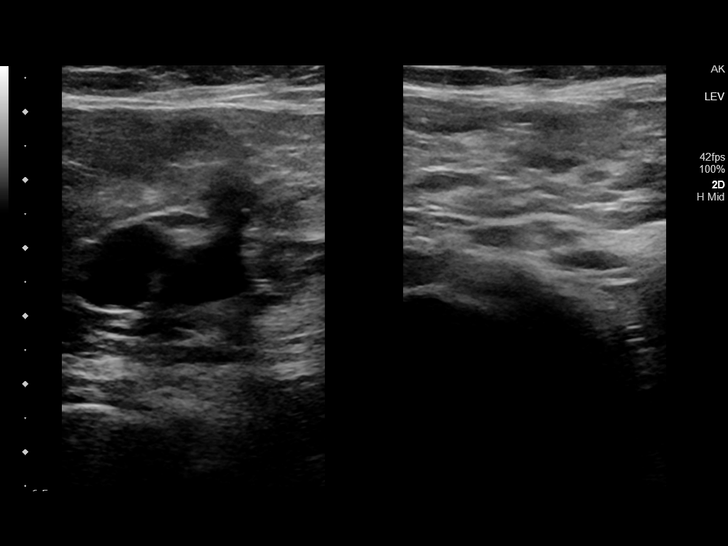
[im 11/63]
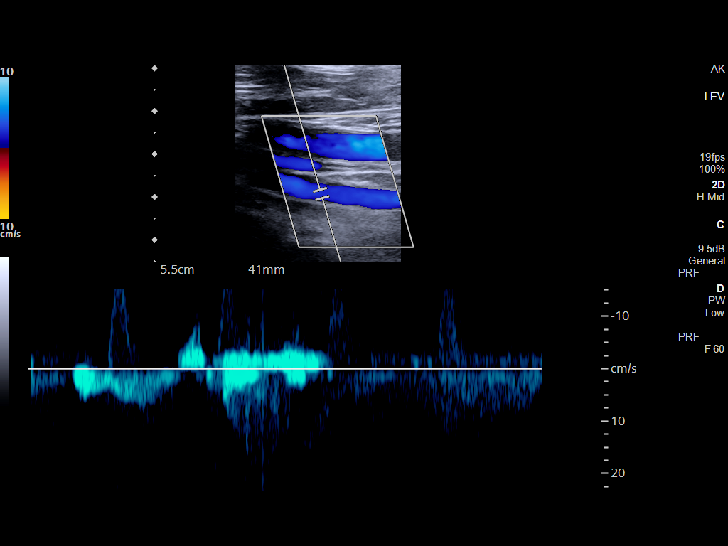
[im 17/63]
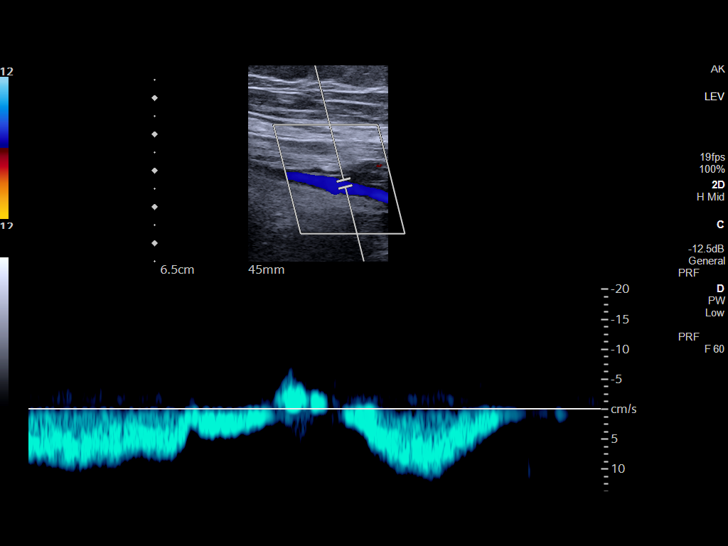
[im 22/63]
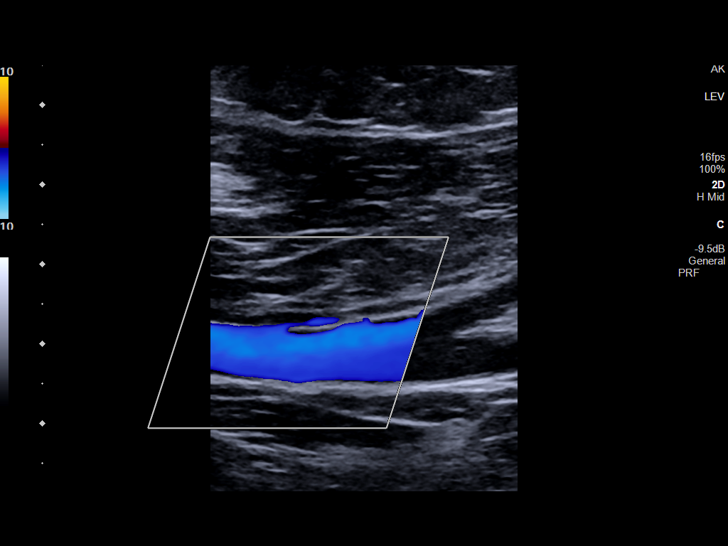
[im 27/63]
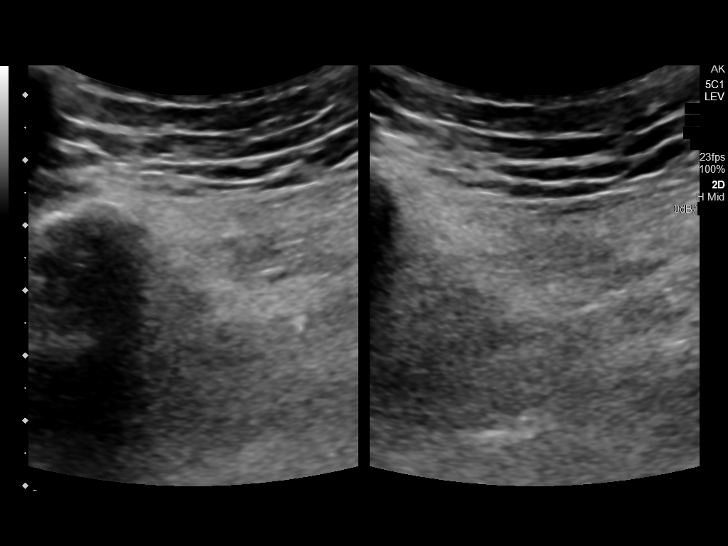
[im 33/63]
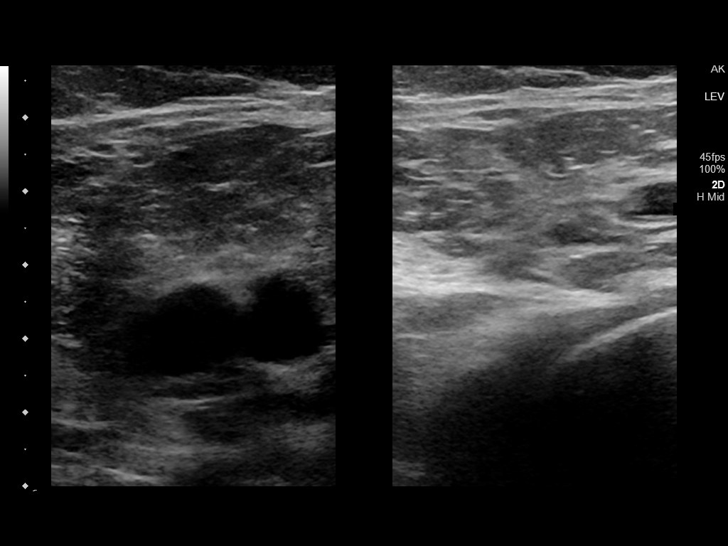
[im 36/63]
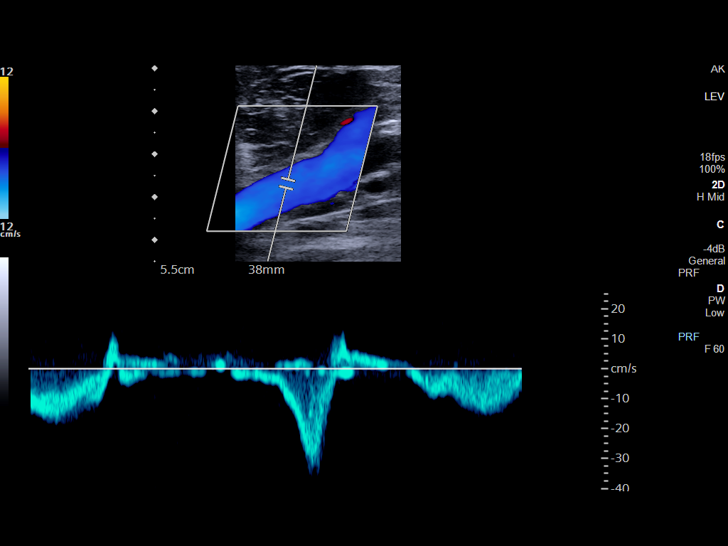
[im 41/63]
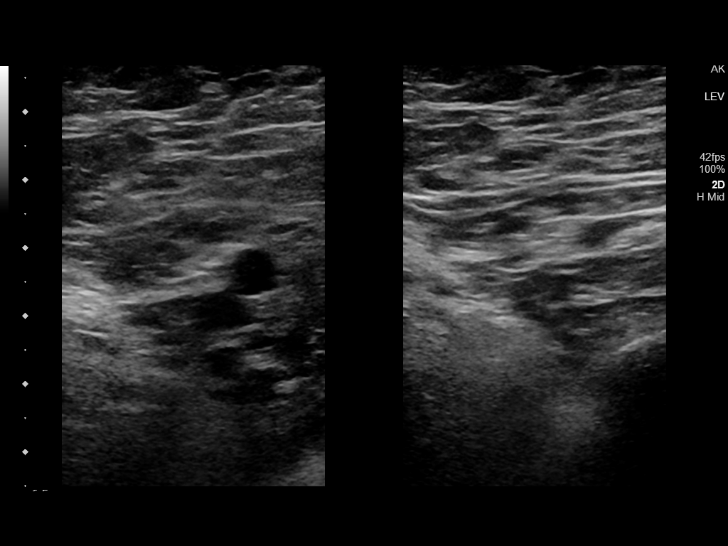
[im 46/63]
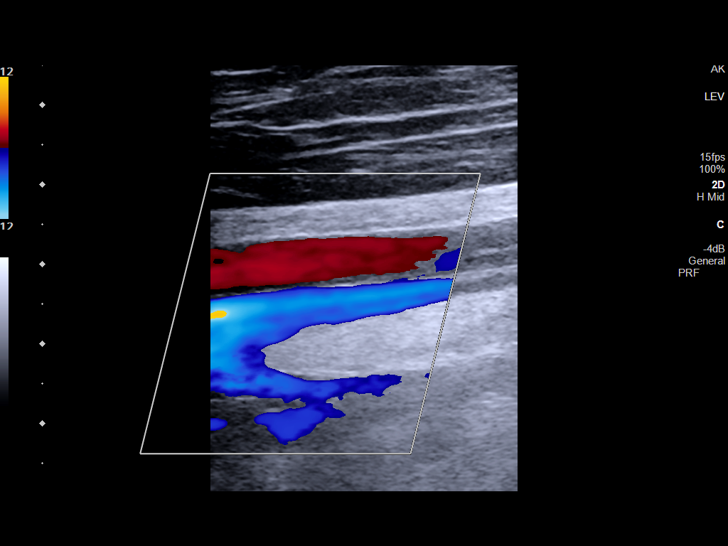
[im 52/63]
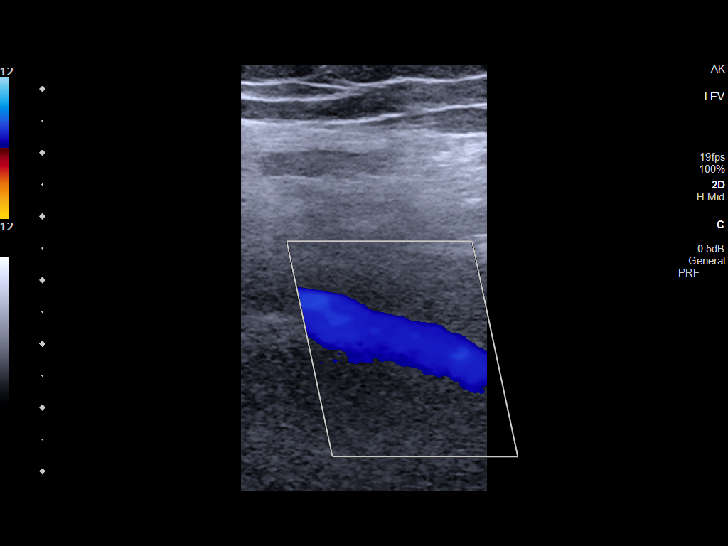
[im 57/63]
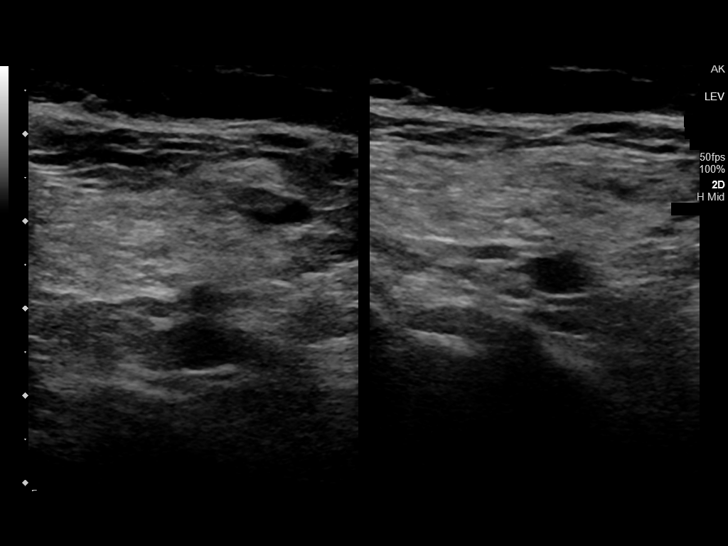
[im 63/63]
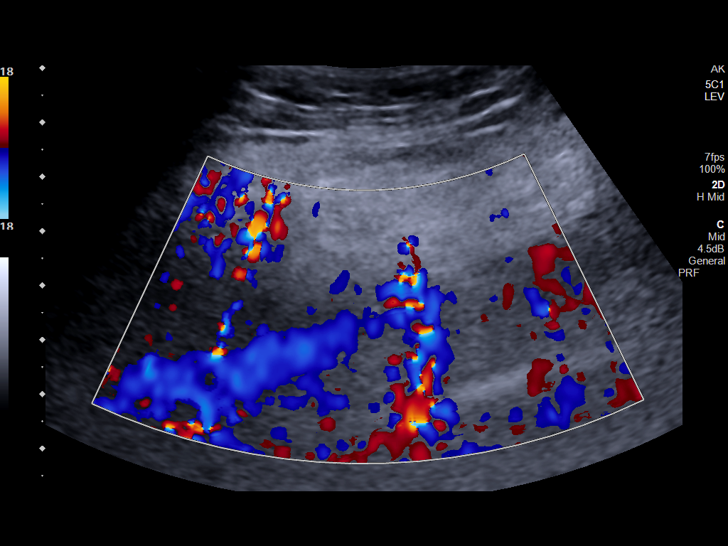

[13 of 24 positions shown; findings below may reference images not displayed]

FINDINGS: RIGHT LOWER EXTREMITY

Common Femoral Vein: No evidence of thrombus. Normal
compressibility, respiratory phasicity and response to augmentation.

Saphenofemoral Junction: No evidence of thrombus. Normal
compressibility and flow on color Doppler imaging.

Profunda Femoral Vein: No evidence of thrombus. Normal
compressibility and flow on color Doppler imaging.

Femoral Vein: No evidence of thrombus. Normal compressibility,
respiratory phasicity and response to augmentation.

Popliteal Vein: No evidence of thrombus. Normal compressibility,
respiratory phasicity and response to augmentation.

Calf Veins: No evidence of thrombus. Normal compressibility and flow
on color Doppler imaging.

Superficial Great Saphenous Vein: No evidence of thrombus. Normal
compressibility.

Venous Reflux:  None.

Other Findings: No evidence of superficial thrombophlebitis or
abnormal fluid collection.

LEFT LOWER EXTREMITY

Common Femoral Vein: No evidence of thrombus. Normal
compressibility, respiratory phasicity and response to augmentation.

Saphenofemoral Junction: No evidence of thrombus. Normal
compressibility and flow on color Doppler imaging.

Profunda Femoral Vein: No evidence of thrombus. Normal
compressibility and flow on color Doppler imaging.

Femoral Vein: No evidence of thrombus. Normal compressibility,
respiratory phasicity and response to augmentation.

Popliteal Vein: No evidence of thrombus. Normal compressibility,
respiratory phasicity and response to augmentation.

Calf Veins: No evidence of thrombus. Normal compressibility and flow
on color Doppler imaging.

Superficial Great Saphenous Vein: No evidence of thrombus. Normal
compressibility.

Venous Reflux:  None.

Other Findings: No evidence of superficial thrombophlebitis or
abnormal fluid collection.
IMPRESSION: No evidence of deep venous thrombosis in either lower extremity.

## 2020-09-08 IMAGING — DX DG CHEST 1V PORT
1 series · 1 of 1 positions shown · non-contrast
Comparison: [DATE]

CLINICAL DATA: Respiratory failure.  [US] positive

EXAM:
PORTABLE CHEST 1 VIEW

[chest ap]
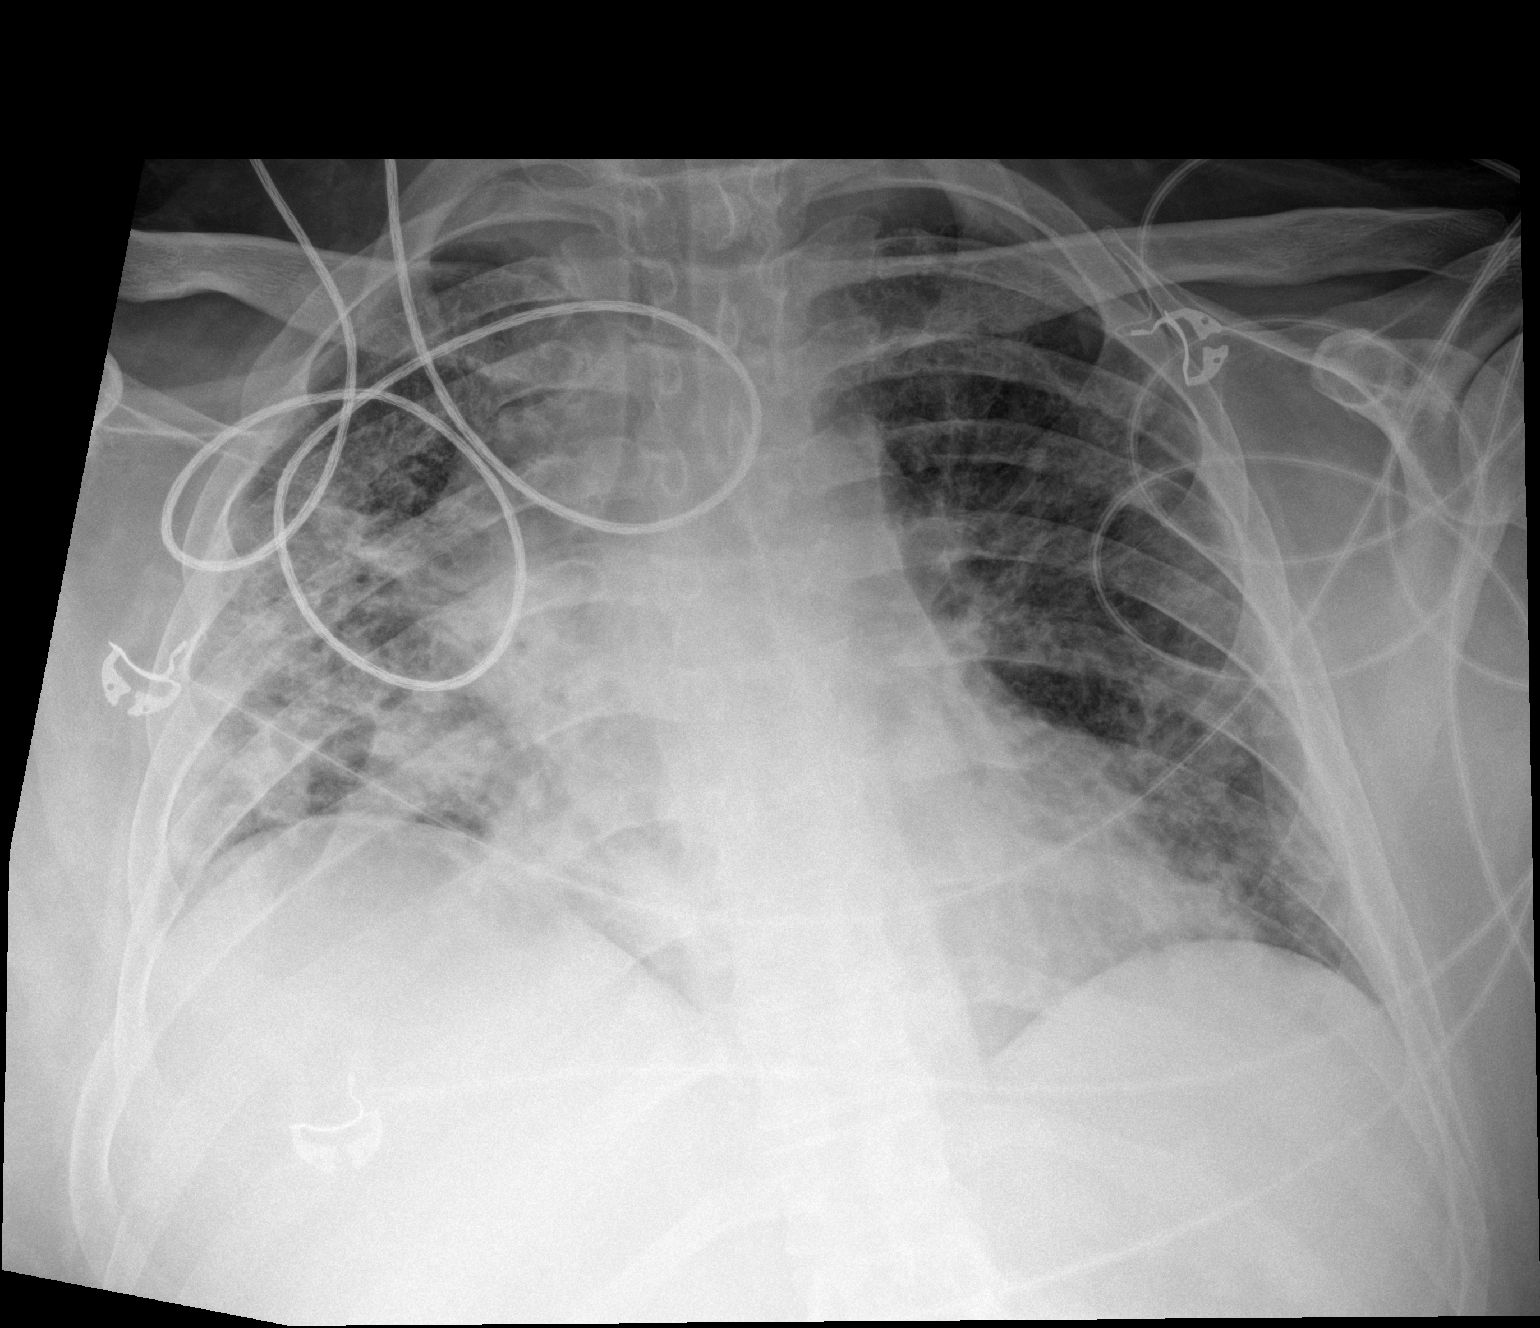

[1 of 1 positions shown; findings below may reference images not displayed]

FINDINGS: Endotracheal tube and nasogastric tube have been removed. No
pneumothorax. There remains multifocal airspace opacity bilaterally,
more severe and extensive on the right than on the left, stable. No
new opacity evident. Heart is upper normal in size with pulmonary
vascularity normal. No adenopathy. No bone lesions.
IMPRESSION: Multifocal airspace opacity, more severe on the right than the left,
remains stable. No new opacity. Stable cardiac silhouette. No
pneumothorax.

## 2020-09-08 MED ORDER — DEXTROSE IN LACTATED RINGERS 5 % IV SOLN: INTRAVENOUS | Status: DC

## 2020-09-08 NOTE — Progress Notes (Addendum)
Patient extubated by Iona Beard, CPT with Dr. Bretta Bang present.  Patient placed on 10L bubble high flow.  Patient following simple commands, able to whisper his name, and has a productive moderately strong cough.  Patient tachycardiac upon extubation.

## 2020-09-08 NOTE — Progress Notes (Signed)
Propofol turned off vent settings changed by MD.  Patient continues to follow simple commands and remain calm.

## 2020-09-08 NOTE — Progress Notes (Signed)
Met wife of patient outside room as patient was extubated this AM. She states she has Jesus and is confident he will recover and get home. She says she can tell from his eyes he has so much to talk about. I assured her the Chaplain';s office is here and available if needed.

## 2020-09-08 NOTE — Progress Notes (Signed)
Patient has been increased from 10L bubble high flow to 15L bubble high flow and then changed to 60 % heated high flow.  Patient remains tachycardiac in 120's and anxious about being in hospital.  Wife [name] remains at bedside.  ABG and CXR ordered.

## 2020-09-08 NOTE — Progress Notes (Signed)
CRITICAL CARE NOTE 30 yo male recent hospitalization for perforated diverticulitis s/p colostomy with reversal and leakage of anastomosis with s/p revision Severe acutehypoxemic resp failurewith COVID19 induced severe ARDS.s/p inrubation/on ventilator   CC  follow up respiratory failure  SUBJECTIVE Patient remains critically ill Prognosis is guarded Extubated this am but appears weak and tacypnic and rquiring high fio2     SIGNIFICANT EVENTS Extubated 09/08/20    BP (!) 98/53   Pulse (!) 102   Temp 99.3 F (37.4 C) (Oral)   Resp 19   Ht 6' 0.01" (1.829 m)   Wt 105 kg   SpO2 100%   BMI 31.39 kg/m    REVIEW OF SYSTEMS  PATIENT IS UNABLE TO PROVIDE COMPLETE REVIEW OF SYSTEMS DUE TO SEVERE CRITICAL ILLNESS   PHYSICAL EXAMINATION:  GENERAL:critically ill appearing, +resp distress HEAD: Normocephalic, atraumatic.  EYES: Pupils equal, round, reactive to light.  No scleral icterus.  MOUTH: Moist mucosal membrane. NECK: Supple. No thyromegaly. No nodules. No JVD.  PULMONARY: +rhonchi, no wheezing CARDIOVASCULAR: S1 and S2. Regular rate and rhythm. No murmurs, rubs, or gallops.  GASTROINTESTINAL: Soft, nontender, -distended. No masses. Positive bowel sounds. No hepatosplenomegaly.  MUSCULOSKELETAL: No swelling, clubbing, or edema.  NEUROLOGIC: drowsy , but follows simple commands. Over all sig motor weakness and unable to move   SKIN:intact,warm,dry  INTAKE/OUTPUT  Intake/Output Summary (Last 24 hours) at 09/08/2020 1212 Last data filed at 09/08/2020 0900 Gross per 24 hour  Intake 3217.56 ml  Output 940 ml  Net 2277.56 ml    LABS  CBC Recent Labs  Lab 09/05/20 0438 09/06/20 0415 09/07/20 0415  WBC 13.4* 16.0* 12.2*  HGB 11.1* 13.6 11.4*  HCT 36.7* 43.4 36.0*  PLT 111* 130* 99*   Coag's No results for input(s): APTT, INR in the last 168 hours. BMET Recent Labs  Lab 09/05/20 0439 09/06/20 0415 09/06/20 2045 09/07/20 0415  NA 139 141  --  138   K 3.7 2.8* 3.9 3.1*  CL 92* 92*  --  98  CO2 39* 34*  --  28  BUN 32* 34*  --  24*  CREATININE <0.30* 0.36*  --  <0.30*  GLUCOSE 115* 148*  --  108*   Electrolytes Recent Labs  Lab 09/05/20 0438 09/05/20 0439 09/06/20 0415 09/07/20 0415  CALCIUM  --  8.8* 9.0 7.9*  MG 2.4  --  2.4 2.2  PHOS  --  2.9 3.9 3.1   Sepsis Markers Recent Labs  Lab 09/05/20 0438 09/06/20 0415  PROCALCITON 0.63 0.68   ABG Recent Labs  Lab 09/02/20 0500 09/03/20 0500 09/03/20 0956  PHART 7.55* 7.51* 7.51*  PCO2ART 52* 54* 56*  PO2ART 63* 87 60*   Liver Enzymes Recent Labs  Lab 09/04/20 0506 09/05/20 0439 09/06/20 0415 09/07/20 0415  AST 47*  --   --   --   ALT 48*  --   --   --   ALKPHOS 86  --   --   --   BILITOT 2.2*  --   --   --   ALBUMIN 3.1* 2.8* 3.1* 2.4*   Cardiac Enzymes No results for input(s): TROPONINI, PROBNP in the last 168 hours. Glucose Recent Labs  Lab 09/07/20 1106 09/07/20 1544 09/07/20 1933 09/08/20 0017 09/08/20 0325 09/08/20 0819  GLUCAP 104* 176* 130* 100* 98 93   Labs pending from this am  Recent Results (from the past 240 hour(s))  Culture, respiratory     Status: None   Collection  Time: 09/06/20  6:27 AM   Specimen: Tracheal Aspirate; Respiratory  Result Value Ref Range Status   Specimen Description   Final    TRACHEAL ASPIRATE Performed at [facility], [address] [place], [place] [address]    Special Requests   Final    NONE Performed at [facility], [address] [place], [place] [address]    Gram Stain   Final    MODERATE WBC PRESENT,BOTH PMN AND MONONUCLEAR ABUNDANT GRAM POSITIVE COCCI IN PAIRS IN CLUSTERS Performed at [facility], [place] [address] [place], [place] [address]    Culture MODERATE STAPHYLOCOCCUS AUREUS  Final   Report Status 09/08/2020 FINAL  Final   Organism ID, Bacteria STAPHYLOCOCCUS AUREUS  Final      Susceptibility   Staphylococcus aureus - MIC*    CIPROFLOXACIN <=0.5  SENSITIVE Sensitive     ERYTHROMYCIN >=8 RESISTANT Resistant     GENTAMICIN <=0.5 SENSITIVE Sensitive     OXACILLIN <=0.25 SENSITIVE Sensitive     TETRACYCLINE <=1 SENSITIVE Sensitive     VANCOMYCIN 1 SENSITIVE Sensitive     TRIMETH/SULFA <=10 SENSITIVE Sensitive     CLINDAMYCIN RESISTANT Resistant     RIFAMPIN <=0.5 SENSITIVE Sensitive     Inducible Clindamycin POSITIVE Resistant     * MODERATE STAPHYLOCOCCUS AUREUS  CULTURE, BLOOD (ROUTINE X 2) w Reflex to ID Panel     Status: Abnormal (Preliminary result)   Collection Time: 09/06/20  9:35 AM   Specimen: BLOOD  Result Value Ref Range Status   Specimen Description   Final    BLOOD LINE Performed at [facility], [address], [place], [place] [address]    Special Requests   Final    BOTTLES DRAWN AEROBIC AND ANAEROBIC Blood Culture adequate volume Performed at [facility], [address] [place], [place] [address]    Culture  Setup Time   Final    GRAM POSITIVE COCCI IN BOTH AEROBIC AND ANAEROBIC BOTTLES CRITICAL RESULT CALLED TO, READ BACK BY AND VERIFIED WITH: JASON ROBINS '@[id number]'  ON 09/16/20 SKL    Culture (A)  Final    STAPHYLOCOCCUS AUREUS SUSCEPTIBILITIES TO FOLLOW Performed at [facility], [place] [address] [place], [place] [address]    Report Status PENDING  Incomplete  Blood Culture ID Panel (Reflexed)     Status: Abnormal   Collection Time: 09/06/20  9:35 AM  Result Value Ref Range Status   Enterococcus faecalis NOT DETECTED NOT DETECTED Final   Enterococcus Faecium NOT DETECTED NOT DETECTED Final   Listeria monocytogenes NOT DETECTED NOT DETECTED Final   Staphylococcus species DETECTED (A) NOT DETECTED Final    Comment: CRITICAL RESULT CALLED TO, READ BACK BY AND VERIFIED WITH: JASON ROBINS '@[id number]'  ON 09/06/20 SKL    Staphylococcus aureus (BCID) DETECTED (A) NOT DETECTED Final    Comment: CRITICAL RESULT CALLED TO, READ BACK BY AND VERIFIED WITH: JASON ROBINS '@[id number]'  ON 09/06/20 SKL     Staphylococcus epidermidis NOT DETECTED NOT DETECTED Final   Staphylococcus lugdunensis NOT DETECTED NOT DETECTED Final   Streptococcus species NOT DETECTED NOT DETECTED Final   Streptococcus agalactiae NOT DETECTED NOT DETECTED Final   Streptococcus pneumoniae NOT DETECTED NOT DETECTED Final   Streptococcus pyogenes NOT DETECTED NOT DETECTED Final   A.calcoaceticus-baumannii NOT DETECTED NOT DETECTED Final   Bacteroides fragilis NOT DETECTED NOT DETECTED Final   Enterobacterales NOT DETECTED NOT DETECTED Final   Enterobacter cloacae complex NOT DETECTED NOT DETECTED Final  Escherichia coli NOT DETECTED NOT DETECTED Final   Klebsiella aerogenes NOT DETECTED NOT DETECTED Final   Klebsiella oxytoca NOT DETECTED NOT DETECTED Final   Klebsiella pneumoniae NOT DETECTED NOT DETECTED Final   Proteus species NOT DETECTED NOT DETECTED Final   Salmonella species NOT DETECTED NOT DETECTED Final   Serratia marcescens NOT DETECTED NOT DETECTED Final   Haemophilus influenzae NOT DETECTED NOT DETECTED Final   Neisseria meningitidis NOT DETECTED NOT DETECTED Final   Pseudomonas aeruginosa NOT DETECTED NOT DETECTED Final   Stenotrophomonas maltophilia NOT DETECTED NOT DETECTED Final   Candida albicans NOT DETECTED NOT DETECTED Final   Candida auris NOT DETECTED NOT DETECTED Final   Candida glabrata NOT DETECTED NOT DETECTED Final   Candida krusei NOT DETECTED NOT DETECTED Final   Candida parapsilosis NOT DETECTED NOT DETECTED Final   Candida tropicalis NOT DETECTED NOT DETECTED Final   Cryptococcus neoformans/gattii NOT DETECTED NOT DETECTED Final   Meth resistant mecA/C and MREJ NOT DETECTED NOT DETECTED Final    Comment: Performed at [facility], [address] [place], [place] [address]  CULTURE, BLOOD (ROUTINE X 2) w Reflex to ID Panel     Status: Abnormal (Preliminary result)   Collection Time: 09/06/20 11:42 AM   Specimen: BLOOD  Result Value Ref Range Status   Specimen  Description   Final    BLOOD LEFT HAND Performed at [facility], [address] [place], [place] [address]    Special Requests   Final    BOTTLES DRAWN AEROBIC AND ANAEROBIC Blood Culture results may not be optimal due to an inadequate volume of blood received in culture bottles Performed at [facility], [address] [place], [place] [address]    Culture  Setup Time   Final    GRAM POSITIVE COCCI IN BOTH AEROBIC AND ANAEROBIC BOTTLES CRITICAL VALUE NOTED.  VALUE IS CONSISTENT WITH PREVIOUSLY REPORTED AND CALLED VALUE. Performed at [facility], [address] [place], [place] [address]    Culture STAPHYLOCOCCUS AUREUS (A)  Final   Report Status PENDING  Incomplete  CULTURE, BLOOD (ROUTINE X 2) w Reflex to ID Panel     Status: None (Preliminary result)   Collection Time: 09/07/20 11:09 AM   Specimen: BLOOD  Result Value Ref Range Status   Specimen Description BLOOD LEFT ANTECUBITAL  Final   Special Requests   Final    BOTTLES DRAWN AEROBIC AND ANAEROBIC Blood Culture adequate volume   Culture   Final    NO GROWTH < 24 HOURS Performed at [facility], [address] [place], [place] [address]    Report Status PENDING  Incomplete    MEDICATIONS   Current Facility-Administered Medications:  .  0.9 %  sodium chloride infusion, 250 mL, Intravenous, PRN, Opyd, Ilene Qua, MD, Stopped at 08/22/20 0039 .  0.9 %  sodium chloride infusion, 250 mL, Intravenous, Continuous, Blakeney, Dana G, NP .  albuterol (VENTOLIN HFA) 108 (90 Base) MCG/ACT inhaler 2 puff, 2 puff, Inhalation, Q6H PRN, Opyd, Ilene Qua, MD .  allopurinol (ZYLOPRIM) tablet 300 mg, 300 mg, Per Tube, Daily, Lorella Nimrod, MD, 300 mg at 09/08/20 1054 .  ceFAZolin (ANCEF) IVPB 2g/100 mL premix, 2 g, Intravenous, Q8H, Kasa, Kurian, MD, Last Rate: 200 mL/hr at 09/08/20 0914, 2 g at 09/08/20 0914 .  chlorhexidine gluconate (MEDLINE KIT) (PERIDEX) 0.12 % solution 15 mL, 15 mL, Mouth  Rinse, BID, Lanney Gins, Fuad, MD, 15 mL at 09/08/20 0856 .  Chlorhexidine  Gluconate Cloth 2 % PADS 6 each, 6 each, Topical, Daily, Ottie Glazier, MD, 6 each at 09/07/20 1426 .  dexmedetomidine (PRECEDEX) 400 MCG/100ML (4 mcg/mL) infusion, 0.4-1.2 mcg/kg/hr, Intravenous, Titrated, Rosine Door, MD, Last Rate: 20.5 mL/hr at 09/08/20 1104, 0.8 mcg/kg/hr at 09/08/20 1104 .  enoxaparin (LOVENOX) injection 52.5 mg, 0.5 mg/kg, Subcutaneous, Q24H, Bretta Bang, Madhavi Hamblen, MD, 52.5 mg at 09/07/20 1427 .  famotidine (PEPCID) IVPB 20 mg premix, 20 mg, Intravenous, Q12H, Rosine Door, MD, Last Rate: 100 mL/hr at 09/08/20 1103, 20 mg at 09/08/20 1103 .  feeding supplement (PROSource TF) liquid 45 mL, 45 mL, Per Tube, 5 X Daily, Kasa, Kurian, MD, 45 mL at 09/08/20 1054 .  feeding supplement (VITAL 1.5 CAL) liquid 1,000 mL, 1,000 mL, Per Tube, Continuous, Kasa, Kurian, MD, Last Rate: 10 mL/hr at 09/08/20 0600, Infusion Verify at 09/08/20 0600 .  fentaNYL (DURAGESIC) 100 MCG/HR 1 patch, 1 patch, Transdermal, Q72H, [name], MD, 1 patch at 09/06/20 1240 .  fentaNYL 2523mg in NS 2520m(1059mml) infusion-PREMIX, 0-400 mcg/hr, Intravenous, Continuous, BasRosine DoorD, Last Rate: 22.5 mL/hr at 09/08/20 0900, 225 mcg/hr at 09/08/20 0900 .  free water 200 mL, 200 mL, Per Tube, Q4H, SmiCandee FurbishD, 200 mL at 09/05/20 2329 .  hydrALAZINE (APRESOLINE) injection 20 mg, 20 mg, Intravenous, Q4H PRN, BasRosine DoorD, 20 mg at 09/05/20 1640 .  ibuprofen (ADVIL) 100 MG/5ML suspension 400 mg, 400 mg, Oral, Once, BasRosine DoorD .  insulin aspart (novoLOG) injection 2-6 Units, 2-6 Units, Subcutaneous, Q4H, KasFlora LippsD, 2 Units at 09/07/20 2051 .  insulin detemir (LEVEMIR) injection 10 Units, 10 Units, Subcutaneous, Q12H, BasRosine DoorD, 10 Units at 09/08/20 1054 .  MEDLINE mouth rinse, 15 mL, Mouth Rinse, 10 times per day, AleOttie GlazierD, 15 mL at 09/08/20 1104 .  ondansetron (ZOFRAN) tablet 4 mg, 4 mg,  Oral, Q6H PRN **OR** ondansetron (ZOFRAN) injection 4 mg, 4 mg, Intravenous, Q6H PRN, Opyd, Timothy S, MD .  oxyCODONE (Oxy IR/ROXICODONE) immediate release tablet 5 mg, 5 mg, Per Tube, Q6H, KasFlora LippsD, 5 mg at 09/08/20 0608 .  PHENObarbital (LUMINAL) injection 65 mg, 65 mg, Intravenous, BID, Kasa, Kurian, MD, 65 mg at 09/08/20 1054 .  propofol (DIPRIVAN) 1000 MG/100ML infusion, 5-80 mcg/kg/min, Intravenous, Titrated, KeeBradly BienenstockP, Last Rate: 13.45 mL/hr at 09/08/20 1057, 20 mcg/kg/min at 09/08/20 1057      Indwelling Urinary Catheter continued, requirement due to   Reason to continue Indwelling Urinary Catheter for strict Intake/Output monitoring for hemodynamic instability   Central Line continued, requirement due to   Reason to continue CenKinder Morgan Energynitoring of central venous pressure or other hemodynamic parameters   Ventilator continued, requirement due to, resp failure    Ventilator Sedation RASS 0 to -2    Echo 09/07/20 EF 55 to 60%. Limited study, valves not well visualized.   ASSESSMENT AND PLAN SYNOPSIS Acute Hypoxic Respiratory Failurein the setting of COVID-19 Pneumonia with severe ARDS perforated diverticulitis s/p colostomy with reversal and leakage of anastomosis with s/p revision now acutely hypoxemic with COVID19 induced severe ARDS,complicated by delirium and agitation likely needsNeeds Trach for Survivalas gets very agitated Failed sedation vacation /SBT xple times  Cont phenobarb, propofol/fentanyl/precedex -continue Full MV support -continue Bronchodilator Therapy -Wean Fio2 and PEEP as tolerated -VAP/VENT bundle implementation discont steroids  extubated today (09/08/20) but appears weak and tacypnic and requiring HFNC, will check ABG CXR today    MSSA pneumonia/Bacteremia 09/06/20 On IV Ancef Repeat blood  c/s daily  PICC line and mid line removed 09/07/20 Echo 09/07/20  EF 55 to 60%. Limited study, valves not well  visualized.   ACUTE DIASTOLIC CARDIAC FAILURE- -oxygen as needed -Lasix as tolerated  Morbid obesity, possible OSA.  On vent, may ned PAP after extubation  AMS with encephalopathy Due to COVID19 and toxic metabolic encephalopathy - partially due to sedation Goal RASS -2 to -3 Cont phenobarb,/propofol/fentanyl. Follow triglycerides, may need to stop propfol    Perforated diverticulitis - patent s/p colostomy  - surgery on case    CARDIAC ICU monitoring   GI   GI PROPHYLAXIS as indicated   DIET-->TF's as tolerated Constipation protocol as indicated  ENDO - will use ICU hypoglycemic\Hyperglycemia protocol if indicated Cont insulin    ELECTROLYTES -follow labs as needed -replace as needed -pharmacy consultation and following   DVT/GI PRX ordered and assessed TRANSFUSIONS AS NEEDED MONITOR FSBS I Assessed the need for Labs I Assessed the need for Foley I Assessed the need for Central Venous Line Family Discussion when available I Assessed the need for Mobilization I made an Assessment of medications to be adjusted accordingly Safety Risk assessment completed   CASE DISCUSSED IN MULTIDISCIPLINARY ROUNDS WITH ICU TEAM Wife at bedside, discussed in detail with her      Critical Care Time devoted to patient care services described in this note is 50  minutes.   Overall, patient is critically ill, prognosis is guarded.  Patient with Multiorgan failure and at high risk for cardiac arrest and death.   Rosine Door, MD  09/20/2020 12:12 PM [facility]

## 2020-09-08 NOTE — Progress Notes (Signed)
Dr. Bretta Bang notified of patient cough becoming weaker and requiring more deep suctioned.  Respiratory rate increased to 28-30.  Patient remains tachycardic in 120-130's. CBG 65.  Order obtained for D5LR at 62 for low blood sugar.

## 2020-09-08 NOTE — Progress Notes (Signed)
Nutrition Follow-up  DOCUMENTATION CODES:   Obesity unspecified  INTERVENTION:  Will monitor for diet advancement once appropriate. Once diet able to be advanced recommend: -Ensure Enlive po TID, each supplement provides 350 kcal and 20 grams of protein -MVI po daily  NUTRITION DIAGNOSIS:   Increased nutrient needs related to catabolic illness ([id number]) as evidenced by estimated needs.  New nutrition diagnosis.  GOAL:   Patient will meet greater than or equal to 90% of their needs  Not met at this time.  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 Pt developed worsening respiratory failure 12/23 requiring intubation  12/25 intubated 1/20 extubated  At time of RD assessment this AM patient was still intubated and on sedation. Tube feeds were held on evening of 1/16 due to gagging and TF aspiration. Tube feeds had been resumed on evening of 1/19 at trickle rate. Patient has since been extubated today and OGT was removed. Will monitor for diet advancement as tolerated. Patient will benefit from oral nutrition supplements once diet is able to be advanced.  Medications reviewed and include: allopurinol, fentanyl patch, Novolog 2-6 units Q4hrs, Levemir 10 units Q12hrs, cefazolin, Precedex gtt, famotidine.  Labs reviewed: CBG 93-176, Potassium 3.1, BUN 24, Creatinine <0.3.  I/O: 1040 mL UOP yesterday (0.4 mL/kg/hr)  Weight trend: 105 kg on 1/20; -5.8 kg from 1/1  Discussed on rounds.  Diet Order:   Diet Order            Diet NPO time specified  Diet effective now                EDUCATION NEEDS:   No  education needs have been identified at this time  Skin:  Skin Assessment: Skin Integrity Issues: Skin Integrity Issues:: Other (Comment) Other: skin tear left back (1cm x 4cm)  Last BM:  09/08/2020 - 30 mL per ostomy  Height:   Ht Readings from Last 1 Encounters:  09/08/20 6' 0.01" (1.829 m)   Weight:   Wt Readings from Last 1 Encounters:  09/08/20 105 kg   Ideal Body Weight:  80.9 kg  BMI:  Body mass index is 31.39 kg/m.  Estimated Nutritional Needs:   Kcal:  2600-2800  Protein:  >/= 130 grams  Fluid:  2.4-2.7L/day  Jacklynn Barnacle, MS, RD, LDN Pager number available on Amion

## 2020-09-08 NOTE — Progress Notes (Deleted)
Complete report and paper work given to Hing Bennett with [facility].  Patient left facility via stretcher with [facility] staff.  

## 2020-09-08 NOTE — Progress Notes (Signed)
   09/08/20 1430  Clinical Encounter Type  Visited With Family    I checked on the family and they were appreciative of the pastoral care and presence they had received.   [place], [place]

## 2020-09-08 NOTE — Progress Notes (Signed)
Propofol decreased by 50% per MD request

## 2020-09-08 NOTE — Progress Notes (Signed)
Patient has increase in anxiety, and keeps stating " I'm dying".  Patient continues to be tachypnic, tachycardiac with moderate cough.  Dr. Bretta Bang notified and is at bedside assessing patient.

## 2020-09-08 NOTE — Progress Notes (Signed)
Date of Admission:  08/09/2020     ID: Craig [name] is a 30 y.o. male  Principal Problem:   Acute hypoxemic respiratory failure due to COVID-19 [facility]) Active Problems:   Severe sepsis ([place])   AKI (acute kidney injury) ([place])   Hypertension   Fever   Metabolic encephalopathy   Staphylococcus aureus bacteremia  Antibiotic history Ceftriaxone 12/21>>12/26 Azithromycin 12/21>>12/23 Zosyn 12/27>>12/31 Vanco 12/27-1 dose Linezolid 12/28>>12/31 08/20/2020>>09/05/20 no antibiotics 1/18 one dose of cefepime 1/18 cefazolin>>  Micro  12/21 BC-NG 12/21 UC-NG 12/27 BC NG 1/18 BC- MSSA 1/18 Resp culture MSSA  LDA 12/23- Midline left 08/13/20- ETT 08/27/20 PICC-rt 08/25/20 Foley  Subjective: Patient was extubated this morning Nonverbal Some respiratory distress.  Medications:  . allopurinol  300 mg Per Tube Daily  . Chlorhexidine Gluconate Cloth  6 each Topical Daily  . enoxaparin (LOVENOX) injection  0.5 mg/kg Subcutaneous Q24H  . fentaNYL  1 patch Transdermal Q72H  . ibuprofen  400 mg Oral Once  . insulin aspart  2-6 Units Subcutaneous Q4H  . insulin detemir  10 Units Subcutaneous Q12H  . oxyCODONE  5 mg Per Tube Q6H  . PHENObarbital  65 mg Intravenous BID    Objective: Vital signs in last 24 hours: Temp:  [98.2 F (36.8 C)-100.4 F (38 C)] 98.3 F (36.8 C) (01/20 1300) Resp:  [16-29] 27 (01/20 1300) BP: (91-148)/(53-95) 134/95 (01/20 1300) SpO2:  [92 %-100 %] 95 % (01/20 1300) FiO2 (%):  [40 %-60 %] 60 % (01/20 1300) Weight:  [105 kg] 105 kg (01/20 0500)  PHYSICAL EXAM:  General: Some respiratory distress, lethargic, some cough   head: Normocephalic, without obvious abnormality, atraumatic. Eyes: Conjunctivae clear, anicteric sclerae. Pupils are equal  Neck: Supple, symmetrical, no adenopathy, thyroid: non tender no carotid bruit and no JVD.  Lungs: Bilateral air entry.  Crepitations present Heart: Regular rate and rhythm, no murmur, rub or  gallop. Abdomen: Soft, colostomy bag Extremities: Edema of the extremities skin: No rashes or lesions. Or bruising Lymph: Cervical, supraclavicular normal. Neurologic: Cannot be assessed  Lab Results Recent Labs    09/06/20 0415 09/06/20 2045 09/07/20 0415  WBC 16.0*  --  12.2*  HGB 13.6  --  11.4*  HCT 43.4  --  36.0*  NA 141  --  138  K 2.8* 3.9 3.1*  CL 92*  --  98  CO2 34*  --  28  BUN 34*  --  24*  CREATININE 0.36*  --  <0.30*   Liver Panel Recent Labs    09/06/20 0415 09/07/20 0415  ALBUMIN 3.1* 2.4*   Sedimentation Rate No results for input(s): ESRSEDRATE in the last 72 hours. C-Reactive Protein Recent Labs    09/06/20 0415 09/07/20 0415  CRP 14.1* 6.4*    Microbiology:  Studies/Results: US Venous Img Lower Bilateral (DVT)  Result Date: 09/08/2020 CLINICAL DATA:  Bacteremia, colonic perforation, COVID pneumonia and respiratory failure. Lower extremity discoloration. EXAM: BILATERAL LOWER EXTREMITY VENOUS DOPPLER ULTRASOUND TECHNIQUE: Gray-scale sonography with graded compression, as well as color Doppler and duplex ultrasound were performed to evaluate the lower extremity deep venous systems from the level of the common femoral vein and including the common femoral, femoral, profunda femoral, popliteal and calf veins including the posterior tibial, peroneal and gastrocnemius veins when visible. The superficial great saphenous vein was also interrogated. Spectral Doppler was utilized to evaluate flow at rest and with distal augmentation maneuvers in the common femoral, femoral and popliteal veins. COMPARISON:  None. FINDINGS:  RIGHT LOWER EXTREMITY Common Femoral Vein: No evidence of thrombus. Normal compressibility, respiratory phasicity and response to augmentation. Saphenofemoral Junction: No evidence of thrombus. Normal compressibility and flow on color Doppler imaging. Profunda Femoral Vein: No evidence of thrombus. Normal compressibility and flow on color  Doppler imaging. Femoral Vein: No evidence of thrombus. Normal compressibility, respiratory phasicity and response to augmentation. Popliteal Vein: No evidence of thrombus. Normal compressibility, respiratory phasicity and response to augmentation. Calf Veins: No evidence of thrombus. Normal compressibility and flow on color Doppler imaging. Superficial Great Saphenous Vein: No evidence of thrombus. Normal compressibility. Venous Reflux:  None. Other Findings: No evidence of superficial thrombophlebitis or abnormal fluid collection. LEFT LOWER EXTREMITY Common Femoral Vein: No evidence of thrombus. Normal compressibility, respiratory phasicity and response to augmentation. Saphenofemoral Junction: No evidence of thrombus. Normal compressibility and flow on color Doppler imaging. Profunda Femoral Vein: No evidence of thrombus. Normal compressibility and flow on color Doppler imaging. Femoral Vein: No evidence of thrombus. Normal compressibility, respiratory phasicity and response to augmentation. Popliteal Vein: No evidence of thrombus. Normal compressibility, respiratory phasicity and response to augmentation. Calf Veins: No evidence of thrombus. Normal compressibility and flow on color Doppler imaging. Superficial Great Saphenous Vein: No evidence of thrombus. Normal compressibility. Venous Reflux:  None. Other Findings: No evidence of superficial thrombophlebitis or abnormal fluid collection. IMPRESSION: No evidence of deep venous thrombosis in either lower extremity. Electronically Signed   By: Aletta Edouard M.D.   On: 09/08/2020 09:38   DG Chest Port 1 View  Result Date: 09/08/2020 CLINICAL DATA:  Respiratory failure.  COVID-19 positive EXAM: PORTABLE CHEST 1 VIEW COMPARISON:  September 06, 2020 FINDINGS: Endotracheal tube and nasogastric tube have been removed. No pneumothorax. There remains multifocal airspace opacity bilaterally, more severe and extensive on the right than on the left, stable. No new  opacity evident. Heart is upper normal in size with pulmonary vascularity normal. No adenopathy. No bone lesions. IMPRESSION: Multifocal airspace opacity, more severe on the right than the left, remains stable. No new opacity. Stable cardiac silhouette. No pneumothorax. Electronically Signed   By: Lowella Grip III M.D.   On: 09/08/2020 13:36   ECHOCARDIOGRAM COMPLETE  Result Date: 09/07/2020    ECHOCARDIOGRAM REPORT   Patient Name:   Craig [name] Date of Exam: 09/07/2020 Medical Rec #:  [id number]             Height:       72.0 in Accession #:    [id number]            Weight:       231.9 lb Date of Birth:  [birth year]              BSA:          2.269 m Patient Age:    29 years              BP:           70/46 mmHg Patient Gender: M                     HR:           80 bpm. Exam Location:  [facility] Procedure: 2D Echo, Cardiac Doppler and Color Doppler Indications:     Bacteremia R78.81  History:         Patient has prior history of Echocardiogram examinations, most  recent 08/17/2020. Risk Factors:Hypertension.  Sonographer:     Sherrie Sport RDCS (AE) Referring Phys:  [id number] Rosine Door Diagnosing Phys: Kate Sable MD  Sonographer Comments: Technically challenging study due to limited acoustic windows, no apical window, no subcostal window and echo performed with patient supine and on artificial respirator. IMPRESSIONS  1. Left ventricular ejection fraction, by estimation, is 60 to 65%. The left ventricle has normal function. The left ventricle has no regional wall motion abnormalities. There is mild left ventricular hypertrophy. Left ventricular diastolic function could not be evaluated.  2. Right ventricular systolic function is normal. The right ventricular size is not well visualized.  3. The mitral valve is normal in structure. No evidence of mitral valve regurgitation.  4. The aortic valve was not well visualized. Aortic valve regurgitation is not visualized. FINDINGS  Left  Ventricle: Left ventricular ejection fraction, by estimation, is 60 to 65%. The left ventricle has normal function. The left ventricle has no regional wall motion abnormalities. The left ventricular internal cavity size was normal in size. There is  mild left ventricular hypertrophy. Left ventricular diastolic function could not be evaluated. Right Ventricle: The right ventricular size is not well visualized. No increase in right ventricular wall thickness. Right ventricular systolic function is normal. Left Atrium: Left atrial size was normal in size. Right Atrium: Right atrial size was not well visualized. Pericardium: There is no evidence of pericardial effusion. Mitral Valve: The mitral valve is normal in structure. No evidence of mitral valve regurgitation. Tricuspid Valve: The tricuspid valve is not well visualized. Tricuspid valve regurgitation is not demonstrated. Aortic Valve: The aortic valve was not well visualized. Aortic valve regurgitation is not visualized. Pulmonic Valve: The pulmonic valve was not well visualized. Pulmonic valve regurgitation is not visualized. Aorta: The aortic root is normal in size and structure. Venous: IVC assessment for right atrial pressure unable to be performed due to mechanical ventilation. IAS/Shunts: No atrial level shunt detected by color flow Doppler.  LEFT VENTRICLE PLAX 2D LVIDd:         4.11 cm LVIDs:         2.32 cm LV PW:         2.00 cm LV IVS:        1.30 cm LVOT diam:     2.00 cm LVOT Area:     3.14 cm  LEFT ATRIUM         Index LA diam:    3.30 cm 1.45 cm/m                        PULMONIC VALVE AORTA                 PV Vmax:        0.73 m/s Ao Root diam: 3.23 cm PV Peak grad:   2.1 mmHg                       RVOT Peak grad: 2 mmHg  TRICUSPID VALVE TR Peak grad:   8.6 mmHg TR Vmax:        147.00 cm/s  SHUNTS Systemic Diam: 2.00 cm Kate Sable MD Electronically signed by Kate Sable MD Signature Date/Time: 09/07/2020/4:23:21 PM    Final       Assessment/Plan: MSSA bacteremia and MSSA multifocal pneumonia. Both PICC line and midline has been removed. Repeat blood culture has been sent. 2D echo has been done but poor study He will need  TEE Patient is on cefazolin.  Acute hypoxic respiratory failure due to COVID was intubated from 12/25 until 09/08/2020.  Extubated today   Complicated diverticular history. Perforated diverticulitis in April 2021 leading to urgent colectomy and end ostomy.  Takedown of ostomy #[id number] complicated by dehiscence needing partial colectomy and colostomy on 07/08/2020.  Anemia Hypoalbuminemia  .  Discussed the management with his wife at bedside.

## 2020-09-08 NOTE — Progress Notes (Signed)
Vent settings changed for wean trial.

## 2020-09-08 NOTE — Plan of Care (Signed)
Patient is extubated to 10L large bore high flow nasal canula

## 2020-09-08 NOTE — Progress Notes (Addendum)
Patient following simple commands, but very weak. Fentanyl decreased from 225mg  to 184mcg per MD request

## 2020-09-09 ENCOUNTER — Inpatient Hospital Stay: Payer: No Typology Code available for payment source

## 2020-09-09 DIAGNOSIS — J9601 Acute respiratory failure with hypoxia: Secondary | ICD-10-CM | POA: Diagnosis not present

## 2020-09-09 DIAGNOSIS — R7881 Bacteremia: Secondary | ICD-10-CM | POA: Diagnosis not present

## 2020-09-09 DIAGNOSIS — U071 COVID-19: Secondary | ICD-10-CM | POA: Diagnosis not present

## 2020-09-09 DIAGNOSIS — B9561 Methicillin susceptible Staphylococcus aureus infection as the cause of diseases classified elsewhere: Secondary | ICD-10-CM | POA: Diagnosis not present

## 2020-09-09 LAB — RENAL FUNCTION PANEL
Albumin: 2.7 g/dL — ABNORMAL LOW (ref 3.5–5.0)
Anion gap: 14 (ref 5–15)
BUN: 12 mg/dL (ref 6–20)
CO2: 27 mmol/L (ref 22–32)
Calcium: 8.4 mg/dL — ABNORMAL LOW (ref 8.9–10.3)
Chloride: 102 mmol/L (ref 98–111)
Creatinine, Ser: <0.3 mg/dL — ABNORMAL LOW (ref 0.61–1.24)
Glucose, Bld: 114 mg/dL — ABNORMAL HIGH (ref 70–99)
Phosphorus: 2.3 mg/dL — ABNORMAL LOW (ref 2.5–4.6)
Potassium: 3.1 mmol/L — ABNORMAL LOW (ref 3.5–5.1)
Sodium: 143 mmol/L (ref 135–145)

## 2020-09-09 LAB — BLOOD GAS, ARTERIAL
Acid-Base Excess: 6.4 mmol/L — ABNORMAL HIGH (ref 0.0–2.0)
Acid-Base Excess: 7.5 mmol/L — ABNORMAL HIGH (ref 0.0–2.0)
Bicarbonate: 30.5 mmol/L — ABNORMAL HIGH (ref 20.0–28.0)
Bicarbonate: 32 mmol/L — ABNORMAL HIGH (ref 20.0–28.0)
Delivery systems: POSITIVE
Expiratory PAP: 8
FIO2: 100
FIO2: 0.5
Inspiratory PAP: 16
MECHVT: 500 mL
O2 Saturation: 99.8 %
O2 Saturation: 99.2 %
PEEP: 5 cmH2O
Patient temperature: 37
Patient temperature: 37
RATE: 24 resp/min
pCO2 arterial: 41 mmHg (ref 32.0–48.0)
pCO2 arterial: 44 mmHg (ref 32.0–48.0)
pH, Arterial: 7.48 — ABNORMAL HIGH (ref 7.350–7.450)
pH, Arterial: 7.47 — ABNORMAL HIGH (ref 7.350–7.450)
pO2, Arterial: 211 mmHg — ABNORMAL HIGH (ref 83.0–108.0)
pO2, Arterial: 132 mmHg — ABNORMAL HIGH (ref 83.0–108.0)

## 2020-09-09 LAB — CULTURE, BLOOD (ROUTINE X 2): Special Requests: ADEQUATE

## 2020-09-09 LAB — GLUCOSE, CAPILLARY
Glucose-Capillary: 123 mg/dL — ABNORMAL HIGH (ref 70–99)
Glucose-Capillary: 86 mg/dL (ref 70–99)
Glucose-Capillary: 86 mg/dL (ref 70–99)
Glucose-Capillary: 97 mg/dL (ref 70–99)

## 2020-09-09 LAB — TRIGLYCERIDES: Triglycerides: 336 mg/dL — ABNORMAL HIGH (ref <150)

## 2020-09-09 LAB — MAGNESIUM: Magnesium: 1.6 mg/dL — ABNORMAL LOW (ref 1.7–2.4)

## 2020-09-09 IMAGING — DX DG CHEST 1V PORT
1 series · 1 of 1 positions shown · non-contrast
Comparison: Chest radiograph from one day prior.

CLINICAL DATA: Intubated, enteric tube placement

EXAM:
PORTABLE CHEST 1 VIEW

[chest ap]
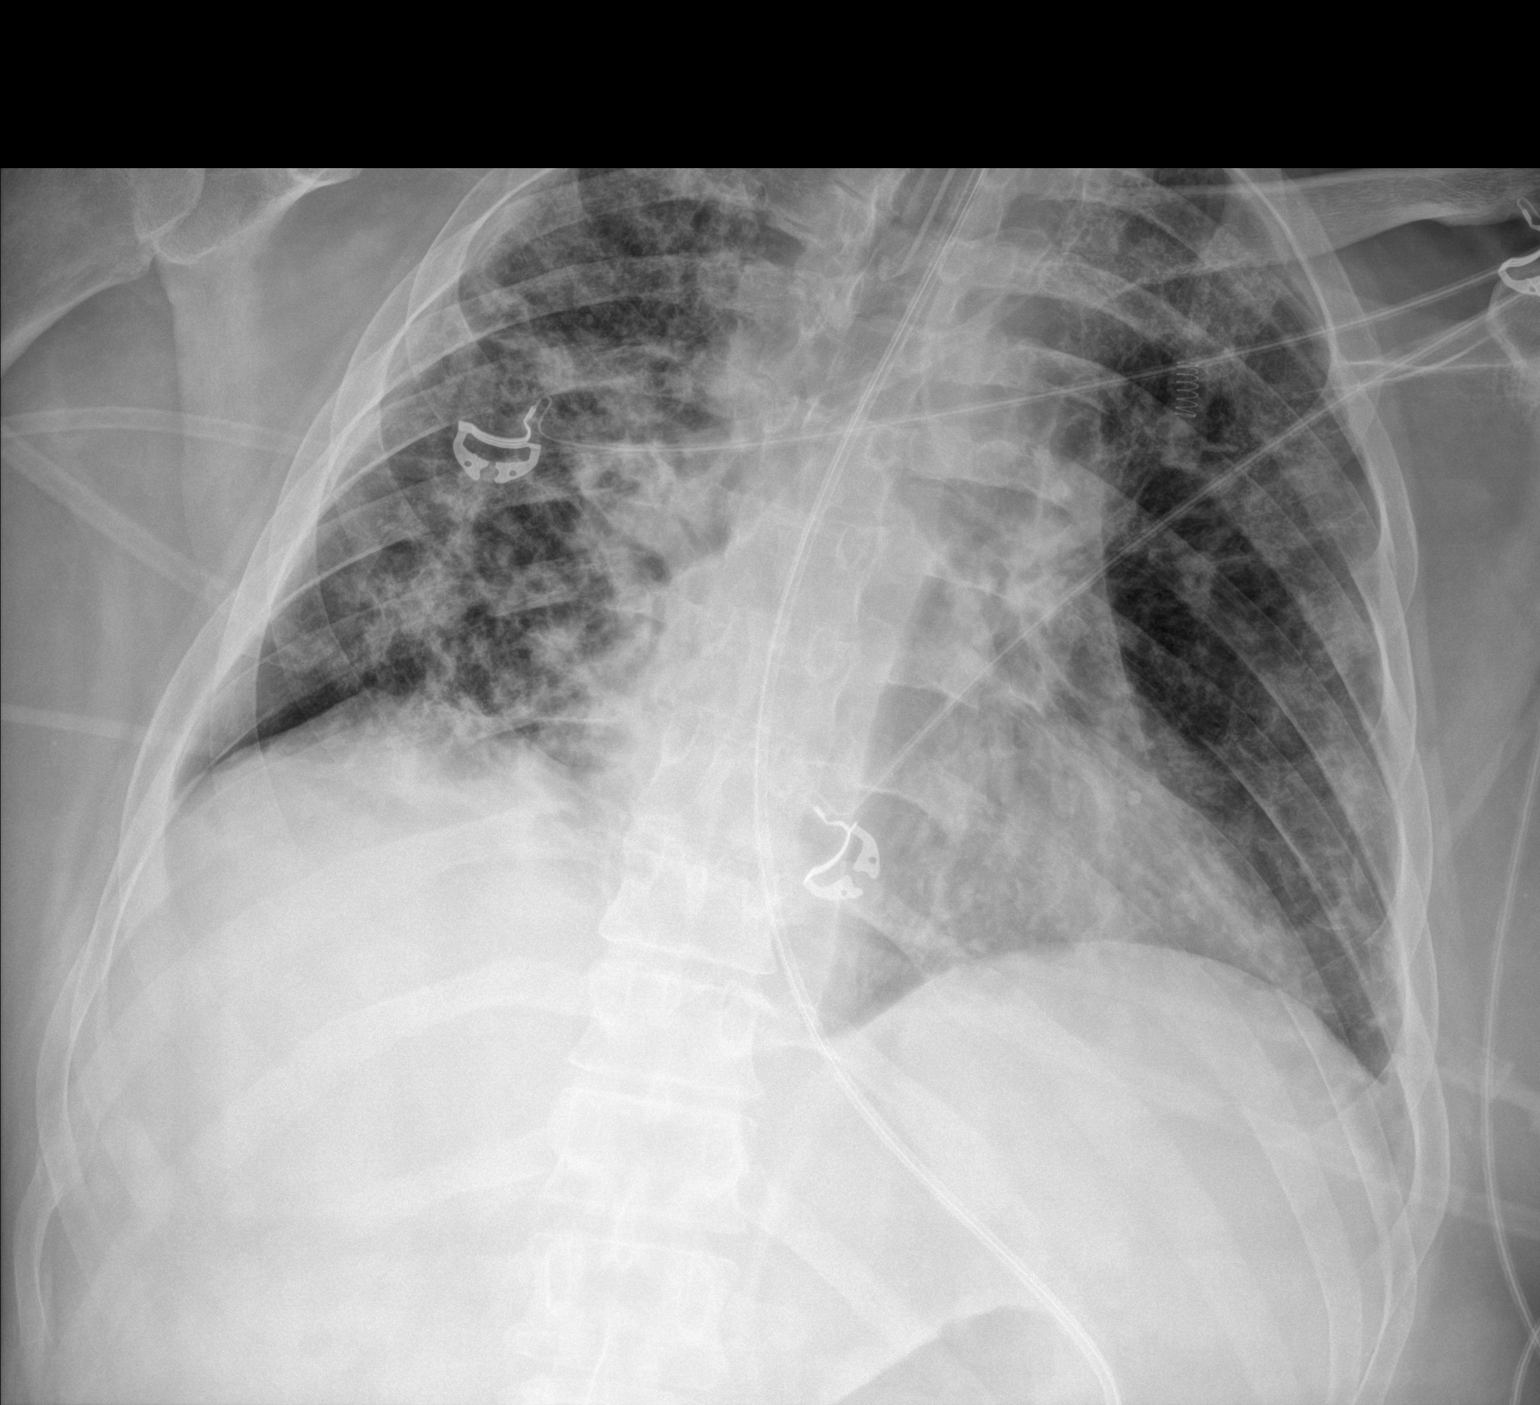

[1 of 1 positions shown; findings below may reference images not displayed]

FINDINGS: Endotracheal tube tip is 3.0 cm above the carina. Enteric tube
enters stomach with the tip not seen on this image. Stable
cardiomediastinal silhouette with normal heart size. No
pneumothorax. No pleural effusion. Severe patchy opacities
throughout the right greater than left lungs, not appreciably
changed, with slightly improved lung volumes.
IMPRESSION: 1. Well-positioned endotracheal tube.
2. Enteric tube enters stomach with the tip not seen on this image.
3. No appreciable change in severe patchy opacities throughout the
right greater than left lungs, with slightly improved lung volumes.
Findings are compatible with [1J] pneumonia.

## 2020-09-09 IMAGING — DX DG ABD PORTABLE 1V
1 series · 1 of 1 positions shown · non-contrast
Comparison: [DATE] abdominal radiograph

CLINICAL DATA: Enteric tube placement

EXAM:
PORTABLE ABDOMEN - 1 VIEW

[abdomen supine]
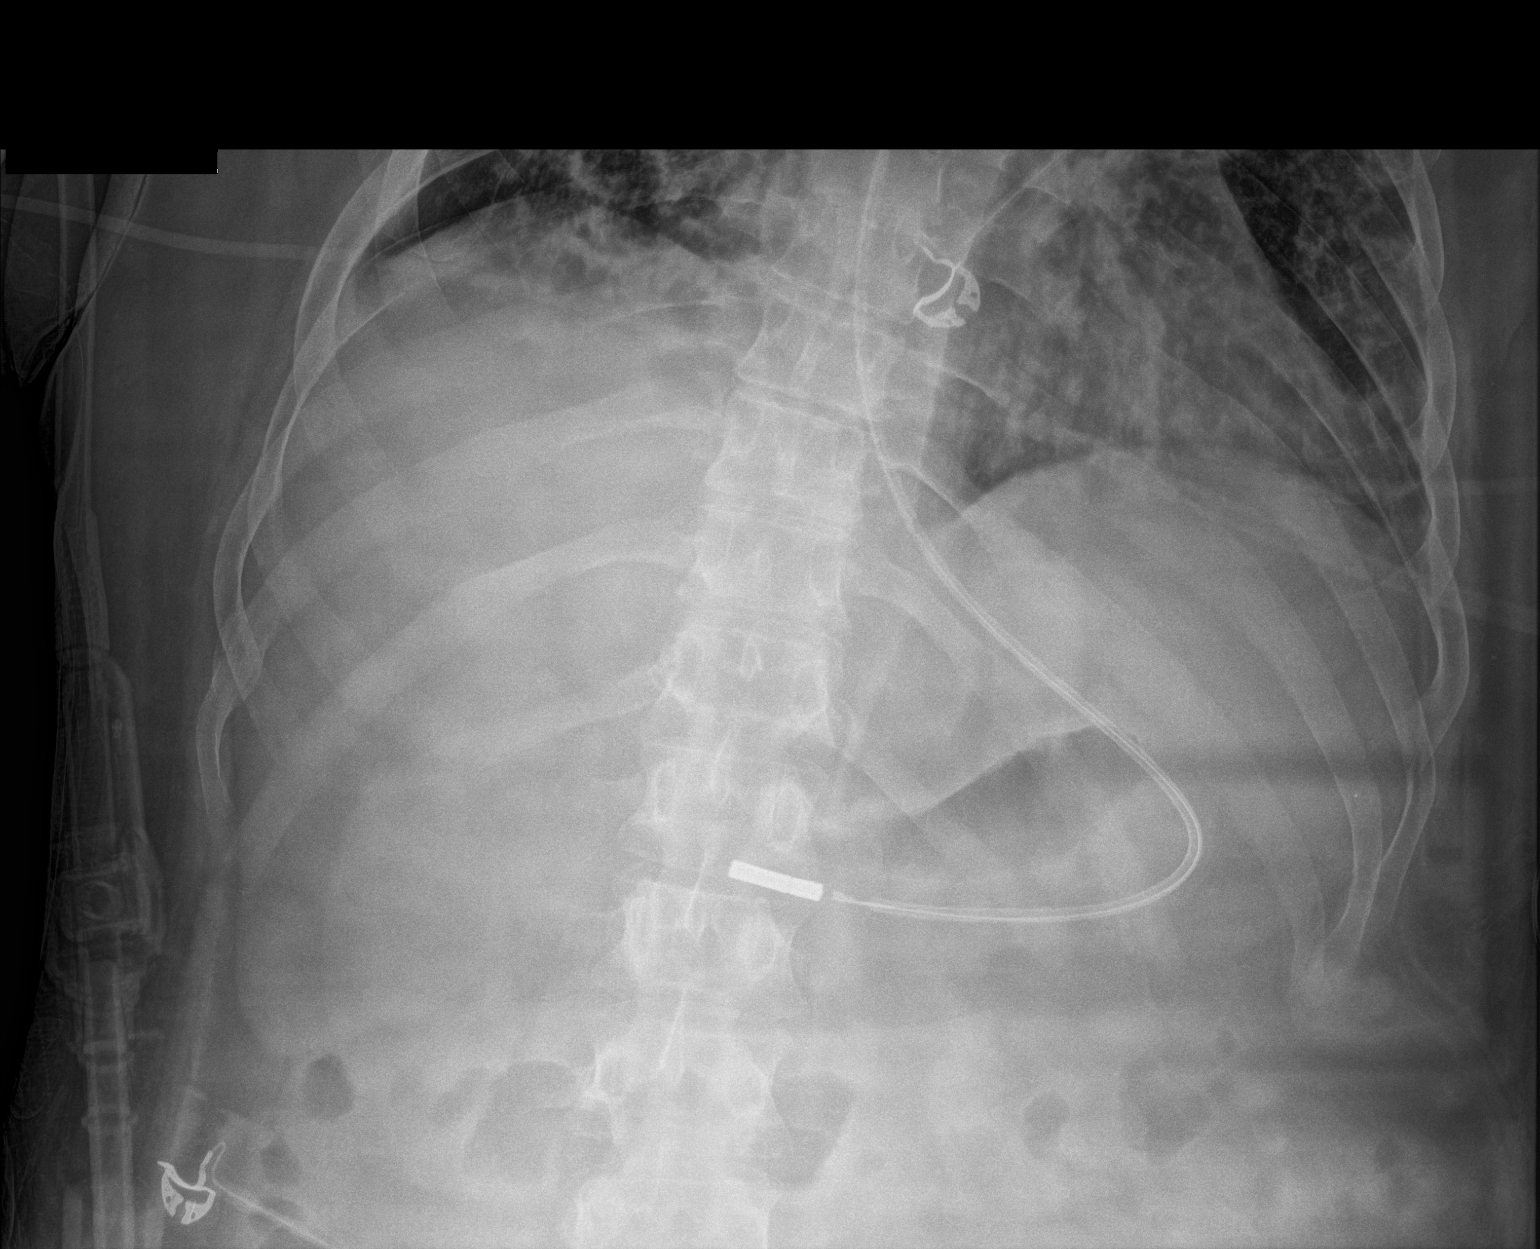

[1 of 1 positions shown; findings below may reference images not displayed]

FINDINGS: Weighted enteric tube tip is in the body of the stomach. No dilated
small bowel loops. No evidence of pneumatosis or pneumoperitoneum.
Patchy opacities at both lung bases.
IMPRESSION: Weighted enteric tube tip is in the body of the stomach.

## 2020-09-09 MED ORDER — VECURONIUM BROMIDE 10 MG IV SOLR: 10.0000 mg | Freq: Once | INTRAVENOUS | Status: AC

## 2020-09-09 MED ORDER — VECURONIUM BROMIDE 10 MG IV SOLR
INTRAVENOUS | Status: AC
  Administered 2020-09-09: 10 mg via INTRAVENOUS
  Filled 2020-09-09: qty 10

## 2020-09-09 MED ORDER — POTASSIUM CHLORIDE 10 MEQ/100ML IV SOLN
10.0000 meq | INTRAVENOUS | Status: AC
  Administered 2020-09-09 (×4): 10 meq via INTRAVENOUS
  Filled 2020-09-09 (×4): qty 100

## 2020-09-09 MED ORDER — ETOMIDATE 2 MG/ML IV SOLN
20.0000 mg | Freq: Once | INTRAVENOUS | Status: AC
  Administered 2020-09-09: 20 mg via INTRAVENOUS
  Filled 2020-09-09: qty 10

## 2020-09-09 MED ORDER — POTASSIUM CHLORIDE 10 MEQ/50ML IV SOLN
10.0000 meq | INTRAVENOUS | Status: DC
Start: 2020-09-09 — End: 2020-09-09
  Filled 2020-09-09 (×4): qty 50

## 2020-09-09 MED ORDER — MIDAZOLAM HCL 2 MG/2ML IJ SOLN
5.0000 mg | Freq: Once | INTRAMUSCULAR | Status: AC
  Administered 2020-09-09: 5 mg via INTRAVENOUS
  Filled 2020-09-09: qty 6

## 2020-09-09 MED ORDER — FENTANYL 2500MCG IN NS 250ML (10MCG/ML) PREMIX INFUSION
0.0000 ug/h | INTRAVENOUS | Status: DC
  Administered 2020-09-09: 200 ug/h via INTRAVENOUS
  Administered 2020-09-10: 300 ug/h via INTRAVENOUS
  Administered 2020-09-10: 350 ug/h via INTRAVENOUS
  Administered 2020-09-10: 250 ug/h via INTRAVENOUS
  Administered 2020-09-11: 400 ug/h via INTRAVENOUS
  Administered 2020-09-11: 300 ug/h via INTRAVENOUS
  Administered 2020-09-11: 400 ug/h via INTRAVENOUS
  Administered 2020-09-12 (×2): 350 ug/h via INTRAVENOUS
  Administered 2020-09-12: 400 ug/h via INTRAVENOUS
  Administered 2020-09-12 – 2020-09-15 (×9): 350 ug/h via INTRAVENOUS
  Administered 2020-09-16: 100 ug/h via INTRAVENOUS
  Administered 2020-09-19: 150 ug/h via INTRAVENOUS
  Administered 2020-09-19: 100 ug/h via INTRAVENOUS
  Administered 2020-09-20: 125 ug/h via INTRAVENOUS
  Administered 2020-09-21: 50 ug/h via INTRAVENOUS
  Administered 2020-09-21: 150 ug/h via INTRAVENOUS
  Filled 2020-09-09 (×23): qty 250

## 2020-09-09 MED ORDER — PROSOURCE TF PO LIQD
45.0000 mL | Freq: Three times a day (TID) | ORAL | Status: DC
  Administered 2020-09-10 – 2020-09-14 (×13): 45 mL
  Filled 2020-09-09 (×10): qty 45

## 2020-09-09 MED ORDER — FENTANYL 2500MCG IN NS 250ML (10MCG/ML) PREMIX INFUSION
INTRAVENOUS | Status: AC
  Filled 2020-09-09: qty 250

## 2020-09-09 MED ORDER — MAGNESIUM SULFATE 2 GM/50ML IV SOLN
2.0000 g | Freq: Once | INTRAVENOUS | Status: AC
  Administered 2020-09-09: 2 g via INTRAVENOUS
  Filled 2020-09-09: qty 50

## 2020-09-09 MED ORDER — PROPOFOL 1000 MG/100ML IV EMUL
5.0000 ug/kg/min | INTRAVENOUS | Status: DC
  Administered 2020-09-09: 20 ug/kg/min via INTRAVENOUS
  Administered 2020-09-09 (×2): 45 ug/kg/min via INTRAVENOUS
  Administered 2020-09-09: 30 ug/kg/min via INTRAVENOUS
  Administered 2020-09-10: 50 ug/kg/min via INTRAVENOUS
  Administered 2020-09-10 (×2): 55 ug/kg/min via INTRAVENOUS
  Administered 2020-09-10 (×4): 50 ug/kg/min via INTRAVENOUS
  Administered 2020-09-10: 60 ug/kg/min via INTRAVENOUS
  Administered 2020-09-11 (×2): 70 ug/kg/min via INTRAVENOUS
  Administered 2020-09-11 (×2): 80 ug/kg/min via INTRAVENOUS
  Administered 2020-09-11: 75 ug/kg/min via INTRAVENOUS
  Administered 2020-09-11: 55 ug/kg/min via INTRAVENOUS
  Administered 2020-09-11: 70 ug/kg/min via INTRAVENOUS
  Administered 2020-09-11: 80 ug/kg/min via INTRAVENOUS
  Administered 2020-09-11: 55 ug/kg/min via INTRAVENOUS
  Administered 2020-09-12 (×3): 70 ug/kg/min via INTRAVENOUS
  Administered 2020-09-12 (×5): 65 ug/kg/min via INTRAVENOUS
  Administered 2020-09-12: 70 ug/kg/min via INTRAVENOUS
  Administered 2020-09-13: 65 ug/kg/min via INTRAVENOUS
  Administered 2020-09-13: 55 ug/kg/min via INTRAVENOUS
  Administered 2020-09-13 (×3): 65 ug/kg/min via INTRAVENOUS
  Filled 2020-09-09 (×37): qty 100

## 2020-09-09 MED ORDER — VALPROATE SODIUM 100 MG/ML IV SOLN
250.0000 mg | Freq: Two times a day (BID) | INTRAVENOUS | Status: DC
  Administered 2020-09-09 – 2020-09-15 (×13): 250 mg via INTRAVENOUS
  Filled 2020-09-09 (×14): qty 2.5

## 2020-09-09 MED ORDER — POTASSIUM CHLORIDE 10 MEQ/50ML IV SOLN: 10.0000 meq | INTRAVENOUS | Status: DC

## 2020-09-09 MED ORDER — VITAL 1.5 CAL PO LIQD
1000.0000 mL | ORAL | Status: DC
  Administered 2020-09-11 – 2020-09-13 (×5): 1000 mL

## 2020-09-09 NOTE — Procedures (Signed)
Endotracheal Intubation: Patient required placement of an artificial airway secondary to Respiratory Failure  Consent: Emergent.   Hand washing performed prior to starting the procedure.   Medications administered for sedation prior to procedure:  Midazolam 5 mg IV,  Vecuronium 10 mg IV, Fentanyl 100 mcg IV. etomadate 20 mg   A time out procedure was called and correct patient, name, & ID confirmed. Needed supplies and equipment were assembled and checked to include ETT, 10 ml syringe, Glidescope, Mac and Miller blades, suction, oxygen and bag mask valve, end tidal CO2 monitor.   Patient was positioned to align the mouth and pharynx to facilitate visualization of the glottis.   Heart rate, SpO2 and blood pressure was continuously monitored during the procedure. Pre-oxygenation was conducted prior to intubation and endotracheal tube was placed through the vocal cords into the trachea.     The artificial airway was placed under direct visualization via glidescope route using a 7.5 ETT on the first attempt.  ETT was secured at 24 cm mark.  Placement was confirmed by auscuitation of lungs with good breath sounds bilaterally and no stomach sounds.  Condensation was noted on endotracheal tube.   Pulse ox 98%.  CO2 detector in place with appropriate color change.   Complications: None .   Operator:Arlander Gillen Chest radiograph ordered and pending.   Comments:     Rosine Door, MD  09/09/2020 1:23 PM [facility] Endotracheal Intubation:

## 2020-09-09 NOTE — Progress Notes (Signed)
Inpatient Diabetes Program Recommendations  AACE/ADA: New Consensus Statement on Inpatient Glycemic Control (2015)  Target Ranges:  Prepandial:   less than 140 mg/dL      Peak postprandial:   less than 180 mg/dL (1-2 hours)      Critically ill patients:  140 - 180 mg/dL   Lab Results  Component Value Date   GLUCAP 123 (H) 09/09/2020   HGBA1C 5.7 (H) 08/10/2020    Review of Glycemic Control Results for Craig [name], Craig [name] (MRN [id number]) as of 09/09/2020 11:50  Ref. Range 09/07/2020 19:33 09/08/2020 00:17 09/08/2020 03:25 09/08/2020 08:19 09/08/2020 13:33 09/08/2020 15:49 09/08/2020 19:42 09/09/2020 00:08 09/09/2020 03:52  Glucose-Capillary Latest Ref Range: 70 - 99 mg/dL 130 (H) 100 (H) 98 93 80 65 (L) 93 86 123 (H)   Diabetes history: None Outpatient Diabetes medications: None Current orders for Inpatient glycemic control:  Levemir 10 units bid, Novolog 2-4-6 q 4 hours  Inpatient Diabetes Program Recommendations:    Consider d/c of Levemir for now.   Thanks,  Adah Perl, RN, BC-ADM Inpatient Diabetes Coordinator Pager [phone] (8a-5p)

## 2020-09-09 NOTE — Progress Notes (Signed)
Nutrition Follow-up  DOCUMENTATION CODES:   Obesity unspecified  INTERVENTION:   Once tube feeds initiated, recommend:  Vital 1.5'@80ml' /hr- Initiate at 31m/hr and increase by 117mhr q 8 hours until goal rate is reached.   Pro-Source TF 4533mID via tube, provides 40kcal and 11g of protein per serving   Free water flushes 27m68m hours to maintain tube patency   Propofol: 12.6 ml/hr- provides 332kcal/day   Regimen provides 3000kcal/day, 163g/day protein and 1647ml62m free water (with propofol provides 3332kcal/day).   Pt at high refeed risk; recommend monitor potassium, magnesium and phosphorus labs daily until stable  NUTRITION DIAGNOSIS:   Increased nutrient needs related to catabolic illness ([id number] by estimated needs.  GOAL:   Provide needs based on ASPEN/SCCM guidelines -Not met at this time.  MONITOR:   Vent status,Labs,Weight trends,Skin,I & O's  ASSESSMENT:   29 yo19ale with h/o Hartmann's for perforated diverticulitis 11/22/19, s/p takedown, parastomal hernia removal and resection of 26cm small bowel 06/28/02/2/12licated by leakage of anastomosis s/p revision 07/08/20 now admitted with COVID19 induced severe ARDS. Pt developed worsening respiratory failure 12/23 requiring intubation   Pt re-intubated today. NGT in place. Will plan to restart tube feeds when medically appropriate. Per chart, pt is down 28lbs(11%) since admit; this is significant. Pt appears to be weight stable over the past week.   Medications reviewed and include: allopurinol, lovenox, insulin, oxycodone, cefazolin, precedex, LRS w/ 5% dextrose '@75ml' /hr, pepcid, propofol  Labs reviewed: K 3.1(L), creat 0.30(L), P 2.3(L), Mg 1.6(L) Triglycerides- 336(H) Wbc- 13.1(H)  Patient is currently intubated on ventilator support MV: 10.6 L/min Temp (24hrs), Avg:100.1 F (37.8 C), Min:99.2 F (37.3 C), Max:100.9 F (38.3 C)  Propofol: 12.6 ml/hr- provides 332kcal/day   MAP-  >65mmH45mOP- 1015ml  70m Order:   Diet Order            Diet NPO time specified  Diet effective now                EDUCATION NEEDS:   No education needs have been identified at this time  Skin:  Skin Assessment: Skin Integrity Issues: Skin Integrity Issues:: Other (Comment) Other: skin tear left back (1cm x 4cm)  Last BM:  1/21- 60 ml via ostomy  Height:   Ht Readings from Last 1 Encounters:  09/08/20 6' 0.01" (1.829 m)   Weight:   Wt Readings from Last 1 Encounters:  09/09/20 105 kg   Ideal Body Weight:  80.9 kg  BMI:  Body mass index is 31.39 kg/m.  Estimated Nutritional Needs:   Kcal:  2800-3100kcal/day  Protein:  >160g/day  Fluid:  2.4-2.7L/day  Jakwan Sally CKoleen Distance, LDN Please refer to [facility] and/or RD on-call/weekend/after hours pager

## 2020-09-09 NOTE — Progress Notes (Signed)
SLP Cancellation Note  Patient Details Name: Craig [name] MRN: [id number] DOB: [birth year]   Cancelled treatment:       Reason Eval/Treat Not Completed: Patient not medically ready;Medical issues which prohibited therapy (chart reviewed; consulted RT.).  Pt reintubated d/t decline in Pulmonary status since extubation yesterday. ST services will sign off at this time.      Orinda Kenner, MS, CCC-SLP Speech Language Pathologist Rehab Services [phone] [employer] 09/09/2020, 1:59 PM

## 2020-09-09 NOTE — Progress Notes (Signed)
ID    Date of Admission:  08/09/2020                                   ID: Craig [name] is a 30 y.o. male  Principal Problem:   Acute hypoxemic respiratory failure due to COVID-19 [facility]) Active Problems:   Severe sepsis ([place])   AKI (acute kidney injury) ([place])   Hypertension   Fever   Metabolic encephalopathy   Staphylococcus aureus bacteremia  Antibiotic history Ceftriaxone 12/21>>12/26 Azithromycin 12/21>>12/23 Zosyn 12/27>>12/31 Vanco 12/27-1 dose Linezolid 12/28>>12/31 08/20/2020>>09/05/20 no antibiotics 1/18 one dose of cefepime 1/18 cefazolin>>  Micro 12/21 BC-NG 12/21 UC-NG 12/27 BC NG 1/18 BC- MSSA 1/18 Resp culture MSSA  LDA 08/13/20- ETT 08/25/20 [place] Objective Patient Vitals for the past 24 hrs:  BP Temp Temp src Resp SpO2 Weight  09/09/20 1459 -- -- -- -- 100 % --  09/09/20 1100 -- -- -- -- 100 % --  09/09/20 0726 -- -- -- -- 100 % --  09/09/20 0700 (!) 141/78 -- -- (!) 22 100 % --  09/09/20 0500 132/81 -- -- (!) 36 100 % 105 kg  09/09/20 0425 -- -- -- (!) 34 100 % --  09/09/20 0400 (!) 162/96 -- -- (!) 31 100 % --  09/09/20 0330 124/78 (!) 100.5 F (38.1 C) -- (!) 31 100 % --  09/09/20 0300 136/84 -- -- (!) 22 100 % --  09/09/20 0230 125/76 -- -- (!) 22 100 % --  09/09/20 0200 127/77 -- -- (!) 30 100 % --  09/09/20 0130 117/75 -- -- (!) 25 100 % --  09/09/20 0016 131/89 (!) 100.9 F (38.3 C) Axillary (!) 47 100 % --  09/09/20 0000 123/80 -- -- (!) 24 100 % --  09/08/20 2330 137/82 -- -- (!) 21 100 % --  09/08/20 2316 (!) 148/91 -- -- (!) 32 100 % --  09/08/20 2300 (!) 156/101 -- -- (!) 21 98 % --  09/08/20 2230 (!) 154/100 -- -- (!) 49 95 % --  09/08/20 2200 (!) 177/99 -- -- (!) 41 99 % --  09/08/20 2130 (!) 161/96 -- -- (!) 25 100 % --  09/08/20 2100 (!) 152/86 -- -- (!) 27 100 % --  09/08/20 2030 (!) 146/100 -- -- 19 100 % --  09/08/20 1945 -- 99.2 F (37.3 C) -- -- -- --  09/08/20 1930 136/83 -- -- (!) 25 100 % --  09/08/20  1900 (!) 141/84 -- -- (!) 26 99 % --  09/08/20 1800 (!) 154/98 99.7 F (37.6 C) Oral (!) 32 98 % --    Patient got reintubated for respiratory distress Sedated Chest bilateral air entry crepitations present Heart sound S1-S2 Abdomen soft Colostomy bag CNS cannot be assessed   Labs CBC Latest Ref Rng & Units 09/08/2020 09/07/2020 09/06/2020  WBC 4.0 - 10.5 K/uL 13.1(H) 12.2(H) 16.0(H)  Hemoglobin 13.0 - 17.0 g/dL 11.4(L) 11.4(L) 13.6  Hematocrit 39.0 - 52.0 % 36.5(L) 36.0(L) 43.4  Platelets 150 - 400 K/uL 113(L) 99(L) 130(L)    CMP Latest Ref Rng & Units 09/09/2020 09/08/2020 09/07/2020  Glucose 70 - 99 mg/dL 114(H) 103(H) 108(H)  BUN 6 - 20 mg/dL 12 15 24(H)  Creatinine 0.61 - 1.24 mg/dL <0.30(L) <0.30(L) <0.30(L)  Sodium 135 - 145 mmol/L 143 140 138  Potassium 3.5 - 5.1 mmol/L 3.1(L) 3.0(L) 3.1(L)  Chloride 98 - 111  mmol/L 102 102 98  CO2 22 - 32 mmol/L 27 25 28   Calcium 8.9 - 10.3 mg/dL 8.4(L) 8.1(L) 7.9(L)  Total Protein 6.5 - 8.1 g/dL - - -  Total Bilirubin 0.3 - 1.2 mg/dL - - -  Alkaline Phos 38 - 126 U/L - - -  AST 15 - 41 U/L - - -  ALT 0 - 44 U/L - - -    Impression/recommendation  MSSA bacteremia and MSSA multifocal pneumonia Repeat blood culture negative 2D echo was a poor study Will discuss with cardiologist early next week regarding TEE Patient is on cefazolin  Acute hypoxic respiratory failure due to COVID was intubated from 1225 until 09/08/2020.  Had to be reintubated today because of respiratory distress  Complicated diverticular history Perforated diverticulitis in April 2021 leading to urgent colectomy and ostomy Takedown of ostomy in November 9381 complicated by dehiscence needing partial colectomy and colostomy on 07/08/2020.  Anemia  Hypoalbuminemia  Discussed the management with the intensivist  ID will follow him peripherally this weekend Call if needed

## 2020-09-09 NOTE — Progress Notes (Addendum)
CRITICAL CARE NOTE  CC  follow up respiratory failure  SUBJECTIVE Patient remains critically ill Prognosis is guarded He is weak and somewhat lethargic. His voice is very weak and is unable to communicate.. He spiked a temp of 100. 15F overnight and requied bipap     SIGNIFICANT EVENTS    BP (!) 141/78   Pulse (!) 102   Temp (!) 100.5 F (38.1 C)   Resp (!) 22   Ht 6' 0.01" (1.829 m)   Wt 105 kg   SpO2 100%   BMI 31.39 kg/m    REVIEW OF SYSTEMS  PATIENT IS UNABLE TO PROVIDE COMPLETE REVIEW OF SYSTEMS DUE TO SEVERE CRITICAL ILLNESS   PHYSICAL EXAMINATION:  GENERAL:critically ill appearing, +resp distress HEAD: Normocephalic, atraumatic.  EYES: Pupils equal, round, reactive to light.  No scleral icterus.  MOUTH: Moist mucosal membrane. NECK: Supple. No thyromegaly. No nodules. No JVD.  PULMONARY: +rhonchi, +wheezing CARDIOVASCULAR: S1 and S2. Regular rate and rhythm. No murmurs, rubs, or gallops.  GASTROINTESTINAL: Soft, nontender, -distended. No masses. Positive bowel sounds. No hepatosplenomegaly.  MUSCULOSKELETAL: No swelling, clubbing, or edema.  NEUROLOGIC:awake, follows commands SKIN:intact,warm,dry  INTAKE/OUTPUT  Intake/Output Summary (Last 24 hours) at 09/09/2020 0917 Last data filed at 09/09/2020 0600 Gross per 24 hour  Intake 1932.31 ml  Output 1075 ml  Net 857.31 ml    LABS  CBC Recent Labs  Lab 09/06/20 0415 09/07/20 0415 09/08/20 1659  WBC 16.0* 12.2* 13.1*  HGB 13.6 11.4* 11.4*  HCT 43.4 36.0* 36.5*  PLT 130* 99* 113*   Coag's No results for input(s): APTT, INR in the last 168 hours. BMET Recent Labs  Lab 09/07/20 0415 09/08/20 1700 09/09/20 0339  NA 138 140 143  K 3.1* 3.0* 3.1*  CL 98 102 102  CO2 28 25 27   BUN 24* 15 12  CREATININE <0.30* <0.30* <0.30*  GLUCOSE 108* 103* 114*   Electrolytes Recent Labs  Lab 09/07/20 0415 09/08/20 1659 09/08/20 1700 09/09/20 0339  CALCIUM 7.9*  --  8.1* 8.4*  MG 2.2 1.7  --   1.6*  PHOS 3.1  --  1.8* 2.3*   Sepsis Markers Recent Labs  Lab 09/05/20 0438 09/06/20 0415  PROCALCITON 0.63 0.68   ABG Recent Labs  Lab 09/03/20 0956 09/08/20 1255 09/09/20 0339  PHART 7.51* 7.52* 7.48*  PCO2ART 56* 36 41  PO2ART 60* 58* 211*   Liver Enzymes Recent Labs  Lab 09/04/20 0506 09/05/20 0439 09/07/20 0415 09/08/20 1700 09/09/20 0339  AST 47*  --   --   --   --   ALT 48*  --   --   --   --   ALKPHOS 86  --   --   --   --   BILITOT 2.2*  --   --   --   --   ALBUMIN 3.1*   < > 2.4* 2.7* 2.7*   < > = values in this interval not displayed.   Cardiac Enzymes No results for input(s): TROPONINI, PROBNP in the last 168 hours. Glucose Recent Labs  Lab 09/08/20 0819 09/08/20 1333 09/08/20 1549 09/08/20 1942 09/09/20 0008 09/09/20 0352  GLUCAP 93 80 65* 93 86 123*     Recent Results (from the past 240 hour(s))  Culture, respiratory     Status: None   Collection Time: 09/06/20  6:27 AM   Specimen: Tracheal Aspirate; Respiratory  Result Value Ref Range Status   Specimen Description   Final    TRACHEAL  ASPIRATE Performed at [facility], [address], [place], [place] [address]    Special Requests   Final    NONE Performed at [facility], [place], [place] [address]    Gram Stain   Final    MODERATE WBC PRESENT,BOTH PMN AND MONONUCLEAR ABUNDANT GRAM POSITIVE COCCI IN PAIRS IN CLUSTERS Performed at [facility], [place] [address] [place], [place] [address]    Culture MODERATE STAPHYLOCOCCUS AUREUS  Final   Report Status 09/08/2020 FINAL  Final   Organism ID, Bacteria STAPHYLOCOCCUS AUREUS  Final      Susceptibility   Staphylococcus aureus - MIC*    CIPROFLOXACIN <=0.5 SENSITIVE Sensitive     ERYTHROMYCIN >=8 RESISTANT Resistant     GENTAMICIN <=0.5 SENSITIVE Sensitive     OXACILLIN <=0.25 SENSITIVE Sensitive     TETRACYCLINE <=1 SENSITIVE Sensitive     VANCOMYCIN 1 SENSITIVE Sensitive      TRIMETH/SULFA <=10 SENSITIVE Sensitive     CLINDAMYCIN RESISTANT Resistant     RIFAMPIN <=0.5 SENSITIVE Sensitive     Inducible Clindamycin POSITIVE Resistant     * MODERATE STAPHYLOCOCCUS AUREUS  CULTURE, BLOOD (ROUTINE X 2) w Reflex to ID Panel     Status: Abnormal   Collection Time: 09/06/20  9:35 AM   Specimen: BLOOD  Result Value Ref Range Status   Specimen Description   Final    BLOOD LINE Performed at [facility], [address], [place], [place] [address]    Special Requests   Final    BOTTLES DRAWN AEROBIC AND ANAEROBIC Blood Culture adequate volume Performed at [facility], [address] [place], [place] [address]    Culture  Setup Time   Final    GRAM POSITIVE COCCI IN BOTH AEROBIC AND ANAEROBIC BOTTLES CRITICAL RESULT CALLED TO, READ BACK BY AND VERIFIED WITH: JASON ROBINS @2128  ON 09/16/20 SKL Performed at [facility], [place] [address] [place],  [address]    Culture STAPHYLOCOCCUS AUREUS (A)  Final   Report Status 09/09/2020 FINAL  Final   Organism ID, Bacteria STAPHYLOCOCCUS AUREUS  Final      Susceptibility   Staphylococcus aureus - MIC*    CIPROFLOXACIN <=0.5 SENSITIVE Sensitive     ERYTHROMYCIN RESISTANT Resistant     GENTAMICIN <=0.5 SENSITIVE Sensitive     OXACILLIN 0.5 SENSITIVE Sensitive     TETRACYCLINE <=1 SENSITIVE Sensitive     VANCOMYCIN 1 SENSITIVE Sensitive     TRIMETH/SULFA <=10 SENSITIVE Sensitive     CLINDAMYCIN RESISTANT Resistant     RIFAMPIN <=0.5 SENSITIVE Sensitive     Inducible Clindamycin POSITIVE Resistant     * STAPHYLOCOCCUS AUREUS  Blood Culture ID Panel (Reflexed)     Status: Abnormal   Collection Time: 09/06/20  9:35 AM  Result Value Ref Range Status   Enterococcus faecalis NOT DETECTED NOT DETECTED Final   Enterococcus Faecium NOT DETECTED NOT DETECTED Final   Listeria monocytogenes NOT DETECTED NOT DETECTED Final   Staphylococcus species DETECTED (A) NOT DETECTED Final    Comment: CRITICAL  RESULT CALLED TO, READ BACK BY AND VERIFIED WITH: JASON ROBINS @2128  ON 09/06/20 SKL    Staphylococcus aureus (BCID) DETECTED (A) NOT DETECTED Final    Comment: CRITICAL RESULT CALLED TO, READ BACK BY AND VERIFIED WITH: JASON ROBINS @2128  ON 09/06/20 SKL    Staphylococcus epidermidis NOT DETECTED NOT DETECTED Final   Staphylococcus lugdunensis NOT DETECTED NOT DETECTED Final   Streptococcus  species NOT DETECTED NOT DETECTED Final   Streptococcus agalactiae NOT DETECTED NOT DETECTED Final   Streptococcus pneumoniae NOT DETECTED NOT DETECTED Final   Streptococcus pyogenes NOT DETECTED NOT DETECTED Final   A.calcoaceticus-baumannii NOT DETECTED NOT DETECTED Final   Bacteroides fragilis NOT DETECTED NOT DETECTED Final   Enterobacterales NOT DETECTED NOT DETECTED Final   Enterobacter cloacae complex NOT DETECTED NOT DETECTED Final   Escherichia coli NOT DETECTED NOT DETECTED Final   Klebsiella aerogenes NOT DETECTED NOT DETECTED Final   Klebsiella oxytoca NOT DETECTED NOT DETECTED Final   Klebsiella pneumoniae NOT DETECTED NOT DETECTED Final   Proteus species NOT DETECTED NOT DETECTED Final   Salmonella species NOT DETECTED NOT DETECTED Final   Serratia marcescens NOT DETECTED NOT DETECTED Final   Haemophilus influenzae NOT DETECTED NOT DETECTED Final   Neisseria meningitidis NOT DETECTED NOT DETECTED Final   Pseudomonas aeruginosa NOT DETECTED NOT DETECTED Final   Stenotrophomonas maltophilia NOT DETECTED NOT DETECTED Final   Candida albicans NOT DETECTED NOT DETECTED Final   Candida auris NOT DETECTED NOT DETECTED Final   Candida glabrata NOT DETECTED NOT DETECTED Final   Candida krusei NOT DETECTED NOT DETECTED Final   Candida parapsilosis NOT DETECTED NOT DETECTED Final   Candida tropicalis NOT DETECTED NOT DETECTED Final   Cryptococcus neoformans/gattii NOT DETECTED NOT DETECTED Final   Meth resistant mecA/C and MREJ NOT DETECTED NOT DETECTED Final    Comment: Performed at [facility], [address] [place], [place] [address]  CULTURE, BLOOD (ROUTINE X 2) w Reflex to ID Panel     Status: Abnormal   Collection Time: 09/06/20 11:42 AM   Specimen: BLOOD  Result Value Ref Range Status   Specimen Description   Final    BLOOD LEFT HAND Performed at [facility], [address] [place], [place] [address]    Special Requests   Final    BOTTLES DRAWN AEROBIC AND ANAEROBIC Blood Culture results may not be optimal due to an inadequate volume of blood received in culture bottles Performed at [facility], [address] [place], [place] [address]    Culture  Setup Time   Final    GRAM POSITIVE COCCI IN BOTH AEROBIC AND ANAEROBIC BOTTLES CRITICAL VALUE NOTED.  VALUE IS CONSISTENT WITH PREVIOUSLY REPORTED AND CALLED VALUE. Performed at [facility], [address] [place], [place] [address]    Culture (A)  Final    STAPHYLOCOCCUS AUREUS SUSCEPTIBILITIES PERFORMED ON PREVIOUS CULTURE WITHIN THE LAST 5 DAYS. Performed at [facility], [place] [address], [place], [place] [address]    Report Status 09/09/2020 FINAL  Final  CULTURE, BLOOD (ROUTINE X 2) w Reflex to ID Panel     Status: None (Preliminary result)   Collection Time: 09/07/20 11:09 AM   Specimen: BLOOD  Result Value Ref Range Status   Specimen Description BLOOD LEFT ANTECUBITAL  Final   Special Requests   Final    BOTTLES DRAWN AEROBIC AND ANAEROBIC Blood Culture adequate volume   Culture   Final    NO GROWTH 2 DAYS Performed at [facility], [address] [place], [place] [address]    Report Status PENDING  Incomplete    MEDICATIONS   Current Facility-Administered Medications:  .  0.9 %  sodium chloride infusion, 250 mL, Intravenous, PRN, Opyd, Ilene Qua, MD, Stopped at 08/22/20 0039 .  0.9 %  sodium chloride infusion, 250 mL, Intravenous, Continuous, Blakeney, Dreama Saa, NP .  albuterol (VENTOLIN HFA) 108 (90 Base) MCG/ACT inhaler 2 puff, 2 puff,  Inhalation, Q6H PRN, Opyd, Ilene Qua, MD .  allopurinol (ZYLOPRIM) tablet 300 mg, 300 mg, Per Tube, Daily, Lorella Nimrod, MD, 300 mg at 09/08/20 1054 .  ceFAZolin (ANCEF) IVPB 2g/100 mL premix, 2 g, Intravenous, Q8H, [name], MD, Stopped at 09/09/20 0044 .  Chlorhexidine Gluconate Cloth 2 % PADS 6 each, 6 each, Topical, Daily, Ottie Glazier, MD, 6 each at 09/08/20 1240 .  dexmedetomidine (PRECEDEX) 400 MCG/100ML (4 mcg/mL) infusion, 0.4-1.2 mcg/kg/hr, Intravenous, Titrated, Rosine Door, MD, Last Rate: 20.5 mL/hr at 09/09/20 0600, 0.8 mcg/kg/hr at 09/09/20 0600 .  dextrose 5 % in lactated ringers infusion, , Intravenous, Continuous, Rosine Door, MD, Last Rate: 75 mL/hr at 09/09/20 0600, Infusion Verify at 09/09/20 0600 .  enoxaparin (LOVENOX) injection 52.5 mg, 0.5 mg/kg, Subcutaneous, Q24H, Jazzalynn Rhudy, MD, 52.5 mg at 09/08/20 1539 .  famotidine (PEPCID) IVPB 20 mg premix, 20 mg, Intravenous, Q12H, Rosine Door, MD, Paused at 09/08/20 2312 .  fentaNYL (DURAGESIC) 100 MCG/HR 1 patch, 1 patch, Transdermal, Q72H, [name], MD, 1 patch at 09/06/20 1240 .  fentaNYL 2585mcg in NS 216mL (18mcg/ml) infusion-PREMIX, 0-400 mcg/hr, Intravenous, Continuous, Rosine Door, MD, Last Rate: 2.5 mL/hr at 09/09/20 0600, 25 mcg/hr at 09/09/20 0600 .  hydrALAZINE (APRESOLINE) injection 20 mg, 20 mg, Intravenous, Q4H PRN, Rosine Door, MD, 20 mg at 09/05/20 1640 .  ibuprofen (ADVIL) 100 MG/5ML suspension 400 mg, 400 mg, Oral, Once, Rosine Door, MD .  insulin aspart (novoLOG) injection 2-6 Units, 2-6 Units, Subcutaneous, Q4H, [name], MD, 2 Units at 09/09/20 0400 .  insulin detemir (LEVEMIR) injection 10 Units, 10 Units, Subcutaneous, Q12H, Rosine Door, MD, 10 Units at 09/08/20 1054 .  ondansetron (ZOFRAN) tablet 4 mg, 4 mg, Oral, Q6H PRN **OR** ondansetron (ZOFRAN) injection 4 mg, 4 mg, Intravenous, Q6H PRN, Opyd, Timothy S, MD .  oxyCODONE (Oxy IR/ROXICODONE) immediate release tablet 5  mg, 5 mg, Per Tube, Q6H, [name], MD, 5 mg at 09/08/20 0608 .  PHENObarbital (LUMINAL) injection 65 mg, 65 mg, Intravenous, BID, Mortimer Fries, Kurian, MD, 65 mg at 09/08/20 2231      Indwelling Urinary Catheter continued, requirement due to   Reason to continue Indwelling Urinary Catheter for strict Intake/Output monitoring for hemodynamic instability   Central Line continued, requirement due to   Reason to continue [employer] Monitoring of central venous pressure or other hemodynamic parameters   Ventilator continued, requirement due to, resp failure    Ventilator Sedation RASS 0 to -2     ASSESSMENT AND PLAN SYNOPSIS  Acute Hypoxic Respiratory Failurein the setting of COVID-19 Pneumonia with severe ARDS perforated diverticulitis s/p colostomy with reversal and leakage of anastomosis with s/p revision now acutely hypoxemic with COVID19 induced severe ARDS,complicated by delirium and agitation likely needsNeeds Trach for Survivalas gets very agitated Failed sedation vacation/SBT xple times Cont phenobarb, propofol/fentanyl/precedex -continue Full MV support -continue Bronchodilator Therapy -Wean Fio2 and PEEP as tolerated -VAP/VENT bundle implementation discont steroids  extubated t (09/08/20) but appears weak and tacypnic and requiring HFNC, Abgs, stable   MSSA pneumonia/Bacteremia 09/06/20 Repeat blood c/s from 1/19 remain negative On IV Ancef Repeat blood c/s  today  PICC line and mid line removed 09/07/20 Echo 09/07/20  EF 55 to 60%. Limited study, valves not well visualized. Will need TEE   ACUTE DIASTOLIC CARDIAC FAILURE- -oxygen as needed -Lasix as tolerated  Morbid obesity, possible OSA.  On vent, may ned PAP after extubation  AMS with encephalopathy Due to COVID19 and toxic metabolic encephalopathy - partially due to sedation Goal RASS -2 to -3 Cont phenobarb,/propofol/fentanyl. Follow triglycerides, may need to stop  propfol    Perforated diverticulitis - patent s/p colostomy  - surgery on case    CARDIAC ICU monitoring   GI  GI PROPHYLAXIS as indicated   DIET-->TF's as tolerated Constipation protocol as indicated  ENDO - will use ICU hypoglycemic\Hyperglycemia protocol if indicated Cont insulin    ELECTROLYTES -follow labs as needed -replace as needed -pharmacy consultation and following   DVT/GI PRX ordered and assessed TRANSFUSIONS AS NEEDED MONITOR FSBS I Assessed the need for Labs I Assessed the need for Foley I Assessed the need for Central Venous Line Family Discussion when available I Assessed the need for Mobilization I made an Assessment of medications to be adjusted accordingly Safety Risk assessment completed   CASE DISCUSSED IN MULTIDISCIPLINARY ROUNDS WITH ICU TEAM Wife at bedside, discussed in detail with her   Critical Care Time devoted to patient care services described in this note is 39  minutes.   Overall, patient is critically ill, prognosis is guarded.  Patient with Multiorgan failure and at high risk for cardiac arrest and death.   Rosine Door, MD  2020/09/26 9:17 AM [facility]

## 2020-09-10 DIAGNOSIS — J9601 Acute respiratory failure with hypoxia: Secondary | ICD-10-CM | POA: Diagnosis not present

## 2020-09-10 DIAGNOSIS — U071 COVID-19: Secondary | ICD-10-CM | POA: Diagnosis not present

## 2020-09-10 LAB — GLUCOSE, CAPILLARY
Glucose-Capillary: 128 mg/dL — ABNORMAL HIGH (ref 70–99)
Glucose-Capillary: 133 mg/dL — ABNORMAL HIGH (ref 70–99)
Glucose-Capillary: 168 mg/dL — ABNORMAL HIGH (ref 70–99)
Glucose-Capillary: 93 mg/dL (ref 70–99)
Glucose-Capillary: 97 mg/dL (ref 70–99)

## 2020-09-10 LAB — CBC WITH DIFFERENTIAL/PLATELET
Abs Immature Granulocytes: 0.13 10*3/uL — ABNORMAL HIGH (ref 0.00–0.07)
Basophils Absolute: 0 10*3/uL (ref 0.0–0.1)
Basophils Relative: 0 %
Eosinophils Absolute: 0.1 10*3/uL (ref 0.0–0.5)
Eosinophils Relative: 1 %
HCT: 31.9 % — ABNORMAL LOW (ref 39.0–52.0)
Hemoglobin: 10.1 g/dL — ABNORMAL LOW (ref 13.0–17.0)
Immature Granulocytes: 1 %
Lymphocytes Relative: 10 %
Lymphs Abs: 1 10*3/uL (ref 0.7–4.0)
MCH: 28.4 pg (ref 26.0–34.0)
MCHC: 31.7 g/dL (ref 30.0–36.0)
MCV: 89.6 fL (ref 80.0–100.0)
Monocytes Absolute: 0.5 10*3/uL (ref 0.1–1.0)
Monocytes Relative: 6 %
Neutro Abs: 8.1 10*3/uL — ABNORMAL HIGH (ref 1.7–7.7)
Neutrophils Relative %: 82 %
Platelets: 126 10*3/uL — ABNORMAL LOW (ref 150–400)
RBC: 3.56 MIL/uL — ABNORMAL LOW (ref 4.22–5.81)
RDW: 17.2 % — ABNORMAL HIGH (ref 11.5–15.5)
WBC: 9.9 10*3/uL (ref 4.0–10.5)
nRBC: 0 % (ref 0.0–0.2)

## 2020-09-10 LAB — RENAL FUNCTION PANEL
Albumin: 2.5 g/dL — ABNORMAL LOW (ref 3.5–5.0)
Anion gap: 9 (ref 5–15)
BUN: 8 mg/dL (ref 6–20)
CO2: 26 mmol/L (ref 22–32)
Calcium: 7.9 mg/dL — ABNORMAL LOW (ref 8.9–10.3)
Chloride: 104 mmol/L (ref 98–111)
Creatinine, Ser: <0.3 mg/dL — ABNORMAL LOW (ref 0.61–1.24)
Glucose, Bld: 129 mg/dL — ABNORMAL HIGH (ref 70–99)
Phosphorus: 2.6 mg/dL (ref 2.5–4.6)
Potassium: 2.6 mmol/L — CL (ref 3.5–5.1)
Sodium: 139 mmol/L (ref 135–145)

## 2020-09-10 LAB — TRIGLYCERIDES: Triglycerides: 302 mg/dL — ABNORMAL HIGH (ref <150)

## 2020-09-10 LAB — MAGNESIUM: Magnesium: 1.9 mg/dL (ref 1.7–2.4)

## 2020-09-10 MED ORDER — MIDAZOLAM HCL 2 MG/2ML IJ SOLN
INTRAMUSCULAR | Status: AC
  Filled 2020-09-10: qty 2

## 2020-09-10 MED ORDER — POTASSIUM CHLORIDE 20 MEQ PO PACK
40.0000 meq | PACK | Freq: Once | ORAL | Status: AC
  Administered 2020-09-10: 40 meq
  Filled 2020-09-10: qty 2

## 2020-09-10 MED ORDER — POTASSIUM CHLORIDE 10 MEQ/100ML IV SOLN
10.0000 meq | INTRAVENOUS | Status: AC
  Administered 2020-09-10 (×5): 10 meq via INTRAVENOUS
  Filled 2020-09-10 (×5): qty 100

## 2020-09-10 MED ORDER — NOREPINEPHRINE 4 MG/250ML-% IV SOLN
2.0000 ug/min | INTRAVENOUS | Status: DC
  Administered 2020-09-10: 4 ug/min via INTRAVENOUS
  Administered 2020-09-10: 8 ug/min via INTRAVENOUS
  Administered 2020-09-11: 7 ug/min via INTRAVENOUS
  Administered 2020-09-11: 10 ug/min via INTRAVENOUS
  Administered 2020-09-12: 6 ug/min via INTRAVENOUS
  Administered 2020-09-12 – 2020-09-13 (×3): 10 ug/min via INTRAVENOUS
  Administered 2020-09-13: 5 ug/min via INTRAVENOUS
  Filled 2020-09-10 (×8): qty 250

## 2020-09-10 MED ORDER — MIDAZOLAM HCL 2 MG/2ML IJ SOLN
2.0000 mg | Freq: Once | INTRAMUSCULAR | Status: AC
  Administered 2020-09-10: 2 mg via INTRAVENOUS

## 2020-09-10 MED ORDER — SODIUM CHLORIDE 0.9 % IV SOLN
250.0000 mL | INTRAVENOUS | Status: DC
  Administered 2020-09-10 – 2020-09-13 (×2): 250 mL via INTRAVENOUS

## 2020-09-10 NOTE — Progress Notes (Signed)
PHARMACY CONSULT NOTE - FOLLOW UP  Pharmacy Consult for Electrolyte Monitoring and Replacement   Recent Labs: Potassium (mmol/L)  Date Value  09/10/2020 2.6 (LL)   Magnesium (mg/dL)  Date Value  09/10/2020 1.9   Calcium (mg/dL)  Date Value  09/10/2020 7.9 (L)   Albumin (g/dL)  Date Value  09/10/2020 2.5 (L)   Phosphorus (mg/dL)  Date Value  09/10/2020 2.6   Sodium (mmol/L)  Date Value  09/10/2020 139     Assessment: 30 yo male admitted with perforated diverticulitis and severe acute hypoxemic respiratory failure secondary to COVID-19 infection. Patient has severe ARDs and was reintubated 1/21. Pharmacy has been consulted for electrolyte monitoring and replacement.   Goal of Therapy:  Electrolytes WNL   Plan:  K: 2.6, Mg: 1.9, Phos: 2.6  Will order KCL 80mEq x 5 IV and 26mEq x1 dose per tube.  Will order F/u K+ level for this evening.  Pharmacy will continue to follow and replace electrolytes as needed.   Pernell Dupre, PharmD, BCPS Clinical Pharmacist 09/10/2020 9:54 AM

## 2020-09-10 NOTE — Consult Note (Signed)
Subjective:   CC: Gastrostomy tube consult.  HPI:  Craig [name] is a 30 y.o. male who was consulted by [facility] for evaluation of above.    History of colostomy creation in the past.  Recently admitted for COVID, extubated but had to be reintubated.  Plan for tracheostomy next week.  Due to the history of colostomy creation, possible open gastrostomy tube placement consultation placed instead of proceeding with a PEG.    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  Past Surgical History:  has a past surgical history that includes laparotomy (N/A, 11/22/2019); Partial colectomy (N/A, 11/22/2019); Colostomy (N/A, 11/22/2019); Colonoscopy with propofol (N/A, 05/18/2020); Xi robotic assisted colostomy takedown (N/A, 06/28/2020); and laparotomy (N/A, 07/08/2020).  Family History: family history includes Healthy in his father and mother.  Social History:  reports that he has never smoked. His smokeless tobacco use includes chew. He reports current alcohol use. He reports that he does not use drugs.  Current Medications:  Prior to Admission medications   Medication Sig Start Date End Date Taking? Authorizing Provider  allopurinol (ZYLOPRIM) 300 MG tablet Take 300 mg by mouth daily. 07/04/20  Yes [provider]  amLODipine-valsartan (EXFORGE) 10-320 MG tablet Take 1 tablet by mouth daily. 07/05/20  Yes [provider]  carvedilol (COREG) 25 MG tablet Take 25 mg by mouth 2 (two) times daily. 07/04/20  Yes [provider]  ondansetron (ZOFRAN-ODT) 4 MG disintegrating tablet Take 1 tablet (4 mg total) by mouth every 6 (six) hours as needed for nausea. 07/03/20  Yes Herbert Pun, MD  spironolactone (ALDACTONE) 25 MG tablet Take 25 mg by mouth daily.  05/12/20  Yes [provider]    Allergies:  Allergies  Allergen Reactions  . Amoxicillin Rash    ROS:  Unable to obtain secondary to patient  intubated and sedated    Objective:     BP (!) 109/40   Pulse (!) 102   Temp 98.4 F (36.9 C)   Resp (!) 24   Ht 6' 0.01" (1.829 m)   Wt 105 kg   SpO2 98%   BMI 31.39 kg/m   Constitutional :  Intubated and sedated  Lymphatics/Throat:  no asymmetry, masses, or scars  Respiratory:  clear to auscultation bilaterally  Cardiovascular:  regular rate and rhythm  Gastrointestinal: soft, non-tender; bowel sounds normal; no masses,  no organomegaly.  Colostomy noted  Skin: Cool and moist  Psychiatric:  Intubated and sedated       LABS:  CMP Latest Ref Rng & Units 09/10/2020 09/09/2020 09/08/2020  Glucose 70 - 99 mg/dL 129(H) 114(H) 103(H)  BUN 6 - 20 mg/dL 8 12 15   Creatinine 0.61 - 1.24 mg/dL <0.30(L) <0.30(L) <0.30(L)  Sodium 135 - 145 mmol/L 139 143 140  Potassium 3.5 - 5.1 mmol/L 2.6(LL) 3.1(L) 3.0(L)  Chloride 98 - 111 mmol/L 104 102 102  CO2 22 - 32 mmol/L 26 27 25   Calcium 8.9 - 10.3 mg/dL 7.9(L) 8.4(L) 8.1(L)  Total Protein 6.5 - 8.1 g/dL - - -  Total Bilirubin 0.3 - 1.2 mg/dL - - -  Alkaline Phos 38 - 126 U/L - - -  AST 15 - 41 U/L - - -  ALT 0 - 44 U/L - - -   CBC Latest Ref Rng & Units 09/10/2020 09/08/2020 09/07/2020  WBC 4.0 - 10.5 K/uL 9.9 13.1(H) 12.2(H)  Hemoglobin 13.0 - 17.0 g/dL 10.1(L) 11.4(L) 11.4(L)  Hematocrit 39.0 -  52.0 % 31.9(L) 36.5(L) 36.0(L)  Platelets 150 - 400 K/uL 126(L) 113(L) 99(L)    RADS: n/a  Assessment:     Respiratory failure secondary to COVID Need for nutrition.   Plan:    Will likely proceed with CT abdomen pelvis to assess location of colon.  Based on results, can potentially attempt PEG tube placement Intra-Op first.  Can convert to open gastrostomy tube placement if PEG is unsuccessful.  Plan discussed with wife and she is in agreement.  We'll discuss risk benefits alternatives later once more definitive date is scheduled after coordination with his tracheostomy procedure

## 2020-09-10 NOTE — Progress Notes (Signed)
CRITICAL CARE NOTE 30 yo male recent hospitalization for perforated diverticulitis s/p colostomy with reversal and leakage of anastomosis with s/p revision Severe acutehypoxemic resp failurewith COVID19 induced severe ARDS.s/p intubation/on ventilator failed extubation twice,awaits trach now Last reintubation 09/09/20   CC  follow up respiratory failure  SUBJECTIVE Patient remains critically ill Prognosis is guarded     SIGNIFICANT EVENTS  Low BP requiring levophed TF initiated  BP (!) 107/54   Pulse (!) 102   Temp 98.4 F (36.9 C)   Resp (!) 21   Ht 6' 0.01" (1.829 m)   Wt 105 kg   SpO2 100%   BMI 31.39 kg/m    REVIEW OF SYSTEMS  PATIENT IS UNABLE TO PROVIDE COMPLETE REVIEW OF SYSTEMS DUE TO SEVERE CRITICAL ILLNESS   PHYSICAL EXAMINATION:  GENERAL:critically ill appearing, no resp distress HEAD: Normocephalic, atraumatic.  EYES: Pupils equal, round, reactive to light.  No scleral icterus.  MOUTH: Moist mucosal membrane. NECK: Supple. No thyromegaly. No nodules. No JVD.  PULMONARY: Clear to auscultation with few rhonchi and crackle.  No wheezing CARDIOVASCULAR: S1 and S2. Regular rate and rhythm. No murmurs, rubs, or gallops.  GASTROINTESTINAL: Soft, nontender, -distended. No masses. Positive bowel sounds. No hepatosplenomegaly.  MUSCULOSKELETAL: No swelling, clubbing, or edema.  NEUROLOGIC: Intubated sedated but moves all extremities when get agitated SKIN:intact,warm,dry  INTAKE/OUTPUT  Intake/Output Summary (Last 24 hours) at 09/10/2020 0823 Last data filed at 09/09/2020 1800 Gross per 24 hour  Intake 1770.59 ml  Output 550 ml  Net 1220.59 ml    LABS  CBC Recent Labs  Lab 09/06/20 0415 09/07/20 0415 09/08/20 1659  WBC 16.0* 12.2* 13.1*  HGB 13.6 11.4* 11.4*  HCT 43.4 36.0* 36.5*  PLT 130* 99* 113*   Coag's No results for input(s): APTT, INR in the last 168 hours. BMET Recent Labs  Lab 09/07/20 0415 09/08/20 1700 09/09/20 0339  NA  138 140 143  K 3.1* 3.0* 3.1*  CL 98 102 102  CO2 28 25 27   BUN 24* 15 12  CREATININE <0.30* <0.30* <0.30*  GLUCOSE 108* 103* 114*   Electrolytes Recent Labs  Lab 09/07/20 0415 09/08/20 1659 09/08/20 1700 09/09/20 0339  CALCIUM 7.9*  --  8.1* 8.4*  MG 2.2 1.7  --  1.6*  PHOS 3.1  --  1.8* 2.3*   Sepsis Markers Recent Labs  Lab 09/05/20 0438 09/06/20 0415  PROCALCITON 0.63 0.68   ABG Recent Labs  Lab 09/08/20 1255 09/09/20 0339 09/09/20 1754  PHART 7.52* 7.48* 7.47*  PCO2ART 36 41 44  PO2ART 58* 211* 132*   Liver Enzymes Recent Labs  Lab 09/04/20 0506 09/05/20 0439 09/07/20 0415 09/08/20 1700 09/09/20 0339  AST 47*  --   --   --   --   ALT 48*  --   --   --   --   ALKPHOS 86  --   --   --   --   BILITOT 2.2*  --   --   --   --   ALBUMIN 3.1*   < > 2.4* 2.7* 2.7*   < > = values in this interval not displayed.   Cardiac Enzymes No results for input(s): TROPONINI, PROBNP in the last 168 hours. Glucose Recent Labs  Lab 09/08/20 1942 09/09/20 0008 09/09/20 0352 09/09/20 1952 09/09/20 2318 09/10/20 0726  GLUCAP 93 86 123* 97 86 133*     Recent Results (from the past 240 hour(s))  Culture, respiratory     Status:  None   Collection Time: 09/06/20  6:27 AM   Specimen: Tracheal Aspirate; Respiratory  Result Value Ref Range Status   Specimen Description   Final    TRACHEAL ASPIRATE Performed at [facility], [address] [place], [place] [address]    Special Requests   Final    NONE Performed at [facility], [address] [place], [place] [address]    Gram Stain   Final    MODERATE WBC PRESENT,BOTH PMN AND MONONUCLEAR ABUNDANT GRAM POSITIVE COCCI IN PAIRS IN CLUSTERS Performed at [facility], [place] [address], [place], [place] [address]    Culture MODERATE STAPHYLOCOCCUS AUREUS  Final   Report Status 09/08/2020 FINAL  Final   Organism ID, Bacteria STAPHYLOCOCCUS AUREUS  Final      Susceptibility    Staphylococcus aureus - MIC*    CIPROFLOXACIN <=0.5 SENSITIVE Sensitive     ERYTHROMYCIN >=8 RESISTANT Resistant     GENTAMICIN <=0.5 SENSITIVE Sensitive     OXACILLIN <=0.25 SENSITIVE Sensitive     TETRACYCLINE <=1 SENSITIVE Sensitive     VANCOMYCIN 1 SENSITIVE Sensitive     TRIMETH/SULFA <=10 SENSITIVE Sensitive     CLINDAMYCIN RESISTANT Resistant     RIFAMPIN <=0.5 SENSITIVE Sensitive     Inducible Clindamycin POSITIVE Resistant     * MODERATE STAPHYLOCOCCUS AUREUS  CULTURE, BLOOD (ROUTINE X 2) w Reflex to ID Panel     Status: Abnormal   Collection Time: 09/06/20  9:35 AM   Specimen: BLOOD  Result Value Ref Range Status   Specimen Description   Final    BLOOD LINE Performed at [facility], [address], [place], [place] [address]    Special Requests   Final    BOTTLES DRAWN AEROBIC AND ANAEROBIC Blood Culture adequate volume Performed at [facility], [address] [place], [place] [address]    Culture  Setup Time   Final    GRAM POSITIVE COCCI IN BOTH AEROBIC AND ANAEROBIC BOTTLES CRITICAL RESULT CALLED TO, READ BACK BY AND VERIFIED WITH: JASON ROBINS @2128  ON 09/16/20 SKL Performed at [facility], [place] [address], [place], [place] [address]    Culture STAPHYLOCOCCUS AUREUS (A)  Final   Report Status 09/09/2020 FINAL  Final   Organism ID, Bacteria STAPHYLOCOCCUS AUREUS  Final      Susceptibility   Staphylococcus aureus - MIC*    CIPROFLOXACIN <=0.5 SENSITIVE Sensitive     ERYTHROMYCIN RESISTANT Resistant     GENTAMICIN <=0.5 SENSITIVE Sensitive     OXACILLIN 0.5 SENSITIVE Sensitive     TETRACYCLINE <=1 SENSITIVE Sensitive     VANCOMYCIN 1 SENSITIVE Sensitive     TRIMETH/SULFA <=10 SENSITIVE Sensitive     CLINDAMYCIN RESISTANT Resistant     RIFAMPIN <=0.5 SENSITIVE Sensitive     Inducible Clindamycin POSITIVE Resistant     * STAPHYLOCOCCUS AUREUS  Blood Culture ID Panel (Reflexed)     Status: Abnormal   Collection Time: 09/06/20  9:35  AM  Result Value Ref Range Status   Enterococcus faecalis NOT DETECTED NOT DETECTED Final   Enterococcus Faecium NOT DETECTED NOT DETECTED Final   Listeria monocytogenes NOT DETECTED NOT DETECTED Final   Staphylococcus species DETECTED (A) NOT DETECTED Final    Comment: CRITICAL RESULT CALLED TO, READ BACK BY AND VERIFIED WITH: JASON ROBINS @2128  ON 09/06/20 SKL    Staphylococcus aureus (BCID) DETECTED (A) NOT DETECTED Final    Comment: CRITICAL RESULT CALLED TO, READ BACK  BY AND VERIFIED WITH: JASON ROBINS @2128  ON 09/06/20 SKL    Staphylococcus epidermidis NOT DETECTED NOT DETECTED Final   Staphylococcus lugdunensis NOT DETECTED NOT DETECTED Final   Streptococcus species NOT DETECTED NOT DETECTED Final   Streptococcus agalactiae NOT DETECTED NOT DETECTED Final   Streptococcus pneumoniae NOT DETECTED NOT DETECTED Final   Streptococcus pyogenes NOT DETECTED NOT DETECTED Final   A.calcoaceticus-baumannii NOT DETECTED NOT DETECTED Final   Bacteroides fragilis NOT DETECTED NOT DETECTED Final   Enterobacterales NOT DETECTED NOT DETECTED Final   Enterobacter cloacae complex NOT DETECTED NOT DETECTED Final   Escherichia coli NOT DETECTED NOT DETECTED Final   Klebsiella aerogenes NOT DETECTED NOT DETECTED Final   Klebsiella oxytoca NOT DETECTED NOT DETECTED Final   Klebsiella pneumoniae NOT DETECTED NOT DETECTED Final   Proteus species NOT DETECTED NOT DETECTED Final   Salmonella species NOT DETECTED NOT DETECTED Final   Serratia marcescens NOT DETECTED NOT DETECTED Final   Haemophilus influenzae NOT DETECTED NOT DETECTED Final   Neisseria meningitidis NOT DETECTED NOT DETECTED Final   Pseudomonas aeruginosa NOT DETECTED NOT DETECTED Final   Stenotrophomonas maltophilia NOT DETECTED NOT DETECTED Final   Candida albicans NOT DETECTED NOT DETECTED Final   Candida auris NOT DETECTED NOT DETECTED Final   Candida glabrata NOT DETECTED NOT DETECTED Final   Candida krusei NOT DETECTED NOT  DETECTED Final   Candida parapsilosis NOT DETECTED NOT DETECTED Final   Candida tropicalis NOT DETECTED NOT DETECTED Final   Cryptococcus neoformans/gattii NOT DETECTED NOT DETECTED Final   Meth resistant mecA/C and MREJ NOT DETECTED NOT DETECTED Final    Comment: Performed at [facility], [address] [place], [place] [address]  CULTURE, BLOOD (ROUTINE X 2) w Reflex to ID Panel     Status: Abnormal   Collection Time: 09/06/20 11:42 AM   Specimen: BLOOD  Result Value Ref Range Status   Specimen Description   Final    BLOOD LEFT HAND Performed at [facility], [address] [place], [place] [address]    Special Requests   Final    BOTTLES DRAWN AEROBIC AND ANAEROBIC Blood Culture results may not be optimal due to an inadequate volume of blood received in culture bottles Performed at [facility], [address] [place], [place] [address]    Culture  Setup Time   Final    GRAM POSITIVE COCCI IN BOTH AEROBIC AND ANAEROBIC BOTTLES CRITICAL VALUE NOTED.  VALUE IS CONSISTENT WITH PREVIOUSLY REPORTED AND CALLED VALUE. Performed at [facility], [address] [place], [place] [address]    Culture (A)  Final    STAPHYLOCOCCUS AUREUS SUSCEPTIBILITIES PERFORMED ON PREVIOUS CULTURE WITHIN THE LAST 5 DAYS. Performed at [facility], [place] [address], [place], [place] [address]    Report Status 09/09/2020 FINAL  Final  CULTURE, BLOOD (ROUTINE X 2) w Reflex to ID Panel     Status: None (Preliminary result)   Collection Time: 09/07/20 11:09 AM   Specimen: BLOOD  Result Value Ref Range Status   Specimen Description BLOOD LEFT ANTECUBITAL  Final   Special Requests   Final    BOTTLES DRAWN AEROBIC AND ANAEROBIC Blood Culture adequate volume   Culture   Final    NO GROWTH 3 DAYS Performed at [facility], [address] [place], [place] [address]    Report Status PENDING  Incomplete  CULTURE, BLOOD (ROUTINE X 2) w Reflex to ID  Panel  Status: None (Preliminary result)   Collection Time: 09/09/20  1:55 PM   Specimen: BLOOD  Result Value Ref Range Status   Specimen Description BLOOD BLOOD LEFT HAND  Final   Special Requests   Final    BOTTLES DRAWN AEROBIC AND ANAEROBIC Blood Culture adequate volume   Culture   Final    NO GROWTH < 24 HOURS Performed at [facility], [address], [place], [place] [address]    Report Status PENDING  Incomplete  CULTURE, BLOOD (ROUTINE X 2) w Reflex to ID Panel     Status: None (Preliminary result)   Collection Time: 09/09/20  2:08 PM   Specimen: BLOOD  Result Value Ref Range Status   Specimen Description BLOOD LEFT ANTECUBITAL  Final   Special Requests   Final    BOTTLES DRAWN AEROBIC AND ANAEROBIC Blood Culture adequate volume   Culture   Final    NO GROWTH < 24 HOURS Performed at [facility], [address], [place], [place] [address]    Report Status PENDING  Incomplete    MEDICATIONS   Current Facility-Administered Medications:  .  0.9 %  sodium chloride infusion, 250 mL, Intravenous, PRN, Opyd, Ilene Qua, MD, Stopped at 08/22/20 0039 .  0.9 %  sodium chloride infusion, 250 mL, Intravenous, Continuous, Blakeney, Dana G, NP .  0.9 %  sodium chloride infusion, 250 mL, Intravenous, Continuous, Rust-Chester, Britton L, NP, Last Rate: 10 mL/hr at 09/10/20 0513, 250 mL at 09/10/20 0513 .  albuterol (VENTOLIN HFA) 108 (90 Base) MCG/ACT inhaler 2 puff, 2 puff, Inhalation, Q6H PRN, Opyd, Ilene Qua, MD .  allopurinol (ZYLOPRIM) tablet 300 mg, 300 mg, Per Tube, Daily, Lorella Nimrod, MD, 300 mg at 09/08/20 1054 .  ceFAZolin (ANCEF) IVPB 2g/100 mL premix, 2 g, Intravenous, Q8H, Kasa, Kurian, MD, Last Rate: 200 mL/hr at 09/09/20 2303, 2 g at 09/09/20 2303 .  Chlorhexidine Gluconate Cloth 2 % PADS 6 each, 6 each, Topical, Daily, Ottie Glazier, MD, 6 each at 09/08/20 1240 .  dexmedetomidine (PRECEDEX) 400 MCG/100ML (4 mcg/mL) infusion, 0.4-1.2 mcg/kg/hr,  Intravenous, Titrated, Rosine Door, MD, Last Rate: 20 mL/hr at 09/10/20 0444, 0.782 mcg/kg/hr at 09/10/20 0444 .  dextrose 5 % in lactated ringers infusion, , Intravenous, Continuous, Rosine Door, MD, Last Rate: 75 mL/hr at 09/09/20 2303, New Bag at 09/09/20 2303 .  enoxaparin (LOVENOX) injection 52.5 mg, 0.5 mg/kg, Subcutaneous, Q24H, Rosine Door, MD, 52.5 mg at 09/09/20 1514 .  famotidine (PEPCID) IVPB 20 mg premix, 20 mg, Intravenous, Q12H, Rosine Door, MD, Last Rate: 100 mL/hr at 09/09/20 2005, 20 mg at 09/09/20 2005 .  feeding supplement (PROSource TF) liquid 45 mL, 45 mL, Per Tube, TID, Rosine Door, MD .  feeding supplement (VITAL 1.5 CAL) liquid 1,000 mL, 1,000 mL, Per Tube, Continuous, Rosine Door, MD .  fentaNYL 2586mcg in NS 274mL (13mcg/ml) infusion-PREMIX, 0-400 mcg/hr, Intravenous, Continuous, Rosine Door, MD, Last Rate: 25 mL/hr at 09/10/20 0432, 250 mcg/hr at 09/10/20 0432 .  hydrALAZINE (APRESOLINE) injection 20 mg, 20 mg, Intravenous, Q4H PRN, Rosine Door, MD, 20 mg at 09/05/20 1640 .  ibuprofen (ADVIL) 100 MG/5ML suspension 400 mg, 400 mg, Oral, Once, Rosine Door, MD .  insulin aspart (novoLOG) injection 2-6 Units, 2-6 Units, Subcutaneous, Q4H, Kasa, Kurian, MD, 2 Units at 09/09/20 0400 .  norepinephrine (LEVOPHED) 4mg  in 257mL premix infusion, 2-10 mcg/min, Intravenous, Titrated, Rust-Chester, Britton L, NP, Last Rate: 7.5 mL/hr at 09/10/20 0604, 2 mcg/min at 09/10/20 0604 .  ondansetron (ZOFRAN) tablet 4 mg, 4 mg, Oral, Q6H PRN **OR** ondansetron (ZOFRAN) injection 4 mg, 4 mg, Intravenous, Q6H PRN, Opyd, Timothy S, MD .  oxyCODONE (Oxy IR/ROXICODONE) immediate release tablet 5 mg, 5 mg, Per Tube, Q6H, [name], MD, 5 mg at 09/09/20 2302 .  propofol (DIPRIVAN) 1000 MG/100ML infusion, 5-80 mcg/kg/min, Intravenous, Titrated, Rosine Door, MD, Last Rate: 31.5 mL/hr at 2020/10/05 0600, 50 mcg/kg/min at 10/05/2020 0600 .  valproate (DEPACON) 250 mg in dextrose  5 % 50 mL IVPB, 250 mg, Intravenous, Q12H, Rosine Door, MD, Last Rate: 52.5 mL/hr at 09/09/20 2012, 250 mg at 09/09/20 2012  cxr 09/09/20, images reviewed by  Bilateral infilterates R>L, ET tube in good position    Indwelling Urinary Catheter continued, requirement due to   Reason to continue Indwelling Urinary Catheter for strict Intake/Output monitoring for hemodynamic instability   Central Line continued, requirement due to   Reason to continue [employer] Monitoring of central venous pressure or other hemodynamic parameters   Ventilator continued, requirement due to, resp failure    Ventilator Sedation RASS 0 to -2     ASSESSMENT AND PLAN SYNOPSIS   Acute Hypoxic Respiratory Failurein the setting of COVID-19 Pneumonia with severe ARDS perforated diverticulitis s/p colostomy with reversal and leakage of anastomosis with s/p revision now acutely hypoxemic with COVID19 induced severe ARDS,complicated by delirium and agitation likely needsNeeds Trach for Survivalas gets very agitated Reintubated 09/09/20 Failed extubation x 2. Cont , propofol/fentanyl/precedex -continue Full MV support -continue Bronchodilator Therapy -Wean Fio2 and PEEP as tolerated -VAP/VENT bundle implementation Off  steroids  Trach likely 1/25 Surgical G tube requested  MSSA pneumonia/Bacteremia 09/06/20 Repeat blood c/s from 1/19  And 1/21 remain negative On IV Ancef PICC line and mid line removed 09/07/20 Echo 09/07/20 EF 55 to 60%. Limited study, valves not well visualized. Will need TEE   ACUTE DIASTOLIC CARDIAC FAILURE- -oxygen as needed -Lasix as tolerated  Morbid obesity, possible OSA.  On vent, may ned PAP after extubation  AMS with encephalopathy with underlying anxiey  Due to [place] and toxic metabolic encephalopathy - partially due to sedation  started depacon 1/21   Perforated diverticulitis - patent s/p colostomy  - surgery on case     CARDIAC ICU monitoring    GI PROPHYLAXIS as indicated   DIET-->TF's as tolerated Constipation protocol as indicated  ENDO - will use ICU hypoglycemic\Hyperglycemia protocol if indicated Cont insulin    ELECTROLYTES -follow labs as needed -replace as needed -pharmacy consultation and following   DVT/GI PRX ordered and assessed TRANSFUSIONS AS NEEDED MONITOR FSBS I Assessed the need for Labs I Assessed the need for Foley I Assessed the need for Central Venous Line Family Discussion when available I Assessed the need for Mobilization I made an Assessment of medications to be adjusted accordingly Safety Risk assessment completed   CASE DISCUSSED IN MULTIDISCIPLINARY ROUNDS WITH ICU TEAM Wife at bedside, discussed in detail with her   Critical Care Time devoted to patient care services described in this note is 31 minutes.   Overall, patient is critically ill, prognosis is guarded.  Patient with Multiorgan failure and at high risk for cardiac arrest and death.   Rosine Door, MD  05-Oct-2020 8:23 AM [facility]

## 2020-09-11 DIAGNOSIS — J9601 Acute respiratory failure with hypoxia: Secondary | ICD-10-CM | POA: Diagnosis not present

## 2020-09-11 DIAGNOSIS — U071 COVID-19: Secondary | ICD-10-CM | POA: Diagnosis not present

## 2020-09-11 LAB — RENAL FUNCTION PANEL
Albumin: 2.4 g/dL — ABNORMAL LOW (ref 3.5–5.0)
Anion gap: 9 (ref 5–15)
BUN: <5 mg/dL — ABNORMAL LOW (ref 6–20)
CO2: 25 mmol/L (ref 22–32)
Calcium: 7.7 mg/dL — ABNORMAL LOW (ref 8.9–10.3)
Chloride: 108 mmol/L (ref 98–111)
Creatinine, Ser: <0.3 mg/dL — ABNORMAL LOW (ref 0.61–1.24)
Glucose, Bld: 174 mg/dL — ABNORMAL HIGH (ref 70–99)
Phosphorus: 2.6 mg/dL (ref 2.5–4.6)
Potassium: 3 mmol/L — ABNORMAL LOW (ref 3.5–5.1)
Sodium: 142 mmol/L (ref 135–145)

## 2020-09-11 LAB — CBC WITH DIFFERENTIAL/PLATELET
Abs Immature Granulocytes: 0.14 10*3/uL — ABNORMAL HIGH (ref 0.00–0.07)
Basophils Absolute: 0 10*3/uL (ref 0.0–0.1)
Basophils Relative: 0 %
Eosinophils Absolute: 0.2 10*3/uL (ref 0.0–0.5)
Eosinophils Relative: 2 %
HCT: 32.8 % — ABNORMAL LOW (ref 39.0–52.0)
Hemoglobin: 10.3 g/dL — ABNORMAL LOW (ref 13.0–17.0)
Immature Granulocytes: 2 %
Lymphocytes Relative: 12 %
Lymphs Abs: 1 10*3/uL (ref 0.7–4.0)
MCH: 28.9 pg (ref 26.0–34.0)
MCHC: 31.4 g/dL (ref 30.0–36.0)
MCV: 91.9 fL (ref 80.0–100.0)
Monocytes Absolute: 0.5 10*3/uL (ref 0.1–1.0)
Monocytes Relative: 5 %
Neutro Abs: 7.1 10*3/uL (ref 1.7–7.7)
Neutrophils Relative %: 79 %
Platelets: 158 10*3/uL (ref 150–400)
RBC: 3.57 MIL/uL — ABNORMAL LOW (ref 4.22–5.81)
RDW: 17.1 % — ABNORMAL HIGH (ref 11.5–15.5)
WBC: 8.9 10*3/uL (ref 4.0–10.5)
nRBC: 0 % (ref 0.0–0.2)

## 2020-09-11 LAB — TRIGLYCERIDES: Triglycerides: 274 mg/dL — ABNORMAL HIGH (ref <150)

## 2020-09-11 LAB — GLUCOSE, CAPILLARY
Glucose-Capillary: 162 mg/dL — ABNORMAL HIGH (ref 70–99)
Glucose-Capillary: 210 mg/dL — ABNORMAL HIGH (ref 70–99)
Glucose-Capillary: 198 mg/dL — ABNORMAL HIGH (ref 70–99)
Glucose-Capillary: 197 mg/dL — ABNORMAL HIGH (ref 70–99)
Glucose-Capillary: 199 mg/dL — ABNORMAL HIGH (ref 70–99)

## 2020-09-11 LAB — MAGNESIUM: Magnesium: 1.6 mg/dL — ABNORMAL LOW (ref 1.7–2.4)

## 2020-09-11 MED ORDER — MAGNESIUM SULFATE 2 GM/50ML IV SOLN: 2.0000 g | Freq: Once | INTRAVENOUS | Status: DC

## 2020-09-11 MED ORDER — POTASSIUM CHLORIDE 10 MEQ/100ML IV SOLN
10.0000 meq | INTRAVENOUS | Status: AC
Start: 2020-09-11 — End: 2020-09-11
  Administered 2020-09-11 (×4): 10 meq via INTRAVENOUS
  Filled 2020-09-11 (×4): qty 100

## 2020-09-11 MED ORDER — ORAL CARE MOUTH RINSE
15.0000 mL | OROMUCOSAL | Status: DC
  Administered 2020-09-11 – 2020-09-24 (×126): 15 mL via OROMUCOSAL

## 2020-09-11 MED ORDER — POTASSIUM CHLORIDE 10 MEQ/100ML IV SOLN: 10.0000 meq | INTRAVENOUS | Status: DC

## 2020-09-11 MED ORDER — MAGNESIUM SULFATE 2 GM/50ML IV SOLN
2.0000 g | Freq: Once | INTRAVENOUS | Status: AC
  Administered 2020-09-11: 2 g via INTRAVENOUS
  Filled 2020-09-11: qty 50

## 2020-09-11 MED ORDER — CHLORHEXIDINE GLUCONATE 0.12% ORAL RINSE (MEDLINE KIT)
15.0000 mL | Freq: Two times a day (BID) | OROMUCOSAL | Status: DC
  Administered 2020-09-11 – 2020-09-24 (×27): 15 mL via OROMUCOSAL

## 2020-09-11 MED ORDER — MIDAZOLAM HCL 2 MG/2ML IJ SOLN
INTRAMUSCULAR | Status: AC
  Filled 2020-09-11: qty 2

## 2020-09-11 NOTE — Progress Notes (Signed)
[facility] for Electrolyte Monitoring and Replacement   Recent Labs: Potassium (mmol/L)  Date Value  09/11/2020 3.0 (L)   Magnesium (mg/dL)  Date Value  09/11/2020 1.6 (L)   Calcium (mg/dL)  Date Value  09/11/2020 7.7 (L)   Albumin (g/dL)  Date Value  09/11/2020 2.4 (L)   Phosphorus (mg/dL)  Date Value  09/11/2020 2.6   Sodium (mmol/L)  Date Value  09/11/2020 142   Corrected Ca: 8.9 mg/dL  Assessment: 30 yo male admitted with perforated diverticulitis and severe acute hypoxemic respiratory failure secondary to COVID-19 infection. Patient has severe ARDs and was reintubated 1/21. Pharmacy has been consulted for electrolyte monitoring and replacement.   Goal of Therapy:  Electrolytes WNL   Plan:   2 grams IV magnesium sulfate x 1  KCL 57mEq x 4 IV   Recheck electrolytes in am  Pharmacy will continue to follow and replace electrolytes as needed.   Dallie Piles, PharmD, BCPS Clinical Pharmacist 09/11/2020 7:41 AM

## 2020-09-11 NOTE — Plan of Care (Signed)
Patient remains intubated, sedated, and on vasopressors. No SBT/WUA today per Dr. Bretta Bang. Minimize sedation and pressors as tolerated. Plan for trach on 09/13/20.     Problem: Education: Goal: Knowledge of General Education information will improve Description: Including pain rating scale, medication(s)/side effects and non-pharmacologic comfort measures Outcome: Not Progressing   Problem: Health Behavior/Discharge Planning: Goal: Ability to manage health-related needs will improve Outcome: Not Progressing   Problem: Clinical Measurements: Goal: Ability to maintain clinical measurements within normal limits will improve Outcome: Not Progressing Goal: Will remain free from infection Outcome: Not Progressing Goal: Diagnostic test results will improve Outcome: Not Progressing Goal: Respiratory complications will improve Outcome: Not Progressing Goal: Cardiovascular complication will be avoided Outcome: Not Progressing   Problem: Activity: Goal: Risk for activity intolerance will decrease Outcome: Not Progressing   Problem: Nutrition: Goal: Adequate nutrition will be maintained Outcome: Not Progressing   Problem: Coping: Goal: Level of anxiety will decrease Outcome: Not Progressing   Problem: Elimination: Goal: Will not experience complications related to bowel motility Outcome: Not Progressing Goal: Will not experience complications related to urinary retention Outcome: Not Progressing   Problem: Pain Managment: Goal: General experience of comfort will improve Outcome: Not Progressing   Problem: Safety: Goal: Ability to remain free from injury will improve Outcome: Not Progressing   Problem: Skin Integrity: Goal: Risk for impaired skin integrity will decrease Outcome: Not Progressing   Problem: Education: Goal: Knowledge of risk factors and measures for prevention of condition will improve Outcome: Not Progressing   Problem: Coping: Goal: Psychosocial and  spiritual needs will be supported Outcome: Not Progressing   Problem: Respiratory: Goal: Will maintain a patent airway Outcome: Not Progressing Goal: Complications related to the disease process, condition or treatment will be avoided or minimized Outcome: Not Progressing   Problem: Education: Goal: Knowledge of risk factors and measures for prevention of condition will improve Outcome: Not Progressing   Problem: Coping: Goal: Psychosocial and spiritual needs will be supported Outcome: Not Progressing   Problem: Respiratory: Goal: Will maintain a patent airway Outcome: Not Progressing Goal: Complications related to the disease process, condition or treatment will be avoided or minimized Outcome: Not Progressing   Problem: Safety: Goal: Violent Restraint(s) Outcome: Not Progressing

## 2020-09-11 NOTE — Progress Notes (Signed)
CRITICAL CARE NOTE 30 yo male recent hospitalization for perforated diverticulitis s/p colostomy with reversal and leakage of anastomosis with s/p revision Severe acutehypoxemic resp failurewith COVID19 induced severe ARDS.s/p intubation/on ventilator failed extubation twice,awaits trach now Last reintubation 09/09/20  CC  follow up respiratory failure  SUBJECTIVE Patient remains critically ill Prognosis is guarded Intermittent episodes of restlessness anxiety and agitation     SIGNIFICANT EVENTS Intermittent episodes of anxiety restlessness and agitation    BP (!) 177/72   Pulse (!) 102   Temp 99 F (37.2 C) (Axillary)   Resp (!) 24   Ht 6' 0.01" (1.829 m)   Wt 107.6 kg   SpO2 93%   BMI 32.16 kg/m    REVIEW OF SYSTEMS  PATIENT IS UNABLE TO PROVIDE COMPLETE REVIEW OF SYSTEMS DUE TO SEVERE CRITICAL ILLNESS   PHYSICAL EXAMINATION:  GENERAL:critically ill appearing, no resp distress HEAD: Normocephalic, atraumatic.  EYES: Pupils equal, round, reactive to light.  No scleral icterus.  MOUTH: Moist mucosal membrane. NECK: Supple. No thyromegaly. No nodules. No JVD.  PULMONARY: Few rhonchi.  No significant wheezing  CARDIOVASCULAR: S1 and S2. Regular rate and rhythm. No murmurs, rubs, or gallops.  GASTROINTESTINAL: Soft, nontender, -distended. No masses. Positive bowel sounds. No hepatosplenomegaly.  MUSCULOSKELETAL: No swelling, clubbing, or edema.  NEUROLOGIC: Intubated and sedated however periodically opens eyes and tries to communicate.  Nonfocal  SKIN:intact,warm,dry  INTAKE/OUTPUT  Intake/Output Summary (Last 24 hours) at 09/11/2020 1131 Last data filed at 09/11/2020 1000 Gross per 24 hour  Intake 4755.13 ml  Output 5145 ml  Net -389.87 ml    LABS  CBC Recent Labs  Lab 09/08/20 1659 09/10/20 0840 09/11/20 0721  WBC 13.1* 9.9 8.9  HGB 11.4* 10.1* 10.3*  HCT 36.5* 31.9* 32.8*  PLT 113* 126* 158   Coag's No results for input(s): APTT, INR in  the last 168 hours. BMET Recent Labs  Lab 09/09/20 0339 09/10/20 0840 09/11/20 0343  NA 143 139 142  K 3.1* 2.6* 3.0*  CL 102 104 108  CO2 _0 BUN 12 8 <5*  CREATININE <0.30* <0.30* <0.30*  GLUCOSE 114* 129* 174*   Electrolytes Recent Labs  Lab 09/09/20 0339 09/10/20 0840 09/11/20 0343  CALCIUM 8.4* 7.9* 7.7*  MG 1.6* 1.9 1.6*  PHOS 2.3* 2.6 2.6   Sepsis Markers Recent Labs  Lab 09/05/20 0438 09/06/20 0415  PROCALCITON 0.63 0.68   ABG Recent Labs  Lab 09/08/20 1255 09/09/20 0339 09/09/20 1754  PHART 7.52* 7.48* 7.47*  PCO2ART 36 41 44  PO2ART 58* 211* 132*   Liver Enzymes Recent Labs  Lab 09/09/20 0339 09/10/20 0840 09/11/20 0343  ALBUMIN 2.7* 2.5* 2.4*   Cardiac Enzymes No results for input(s): TROPONINI, PROBNP in the last 168 hours. Glucose Recent Labs  Lab 09/10/20 1135 09/10/20 1623 09/10/20 1923 09/10/20 2347 09/11/20 0326 09/11/20 0744  GLUCAP 128* 93 97 168* 162* 210*     Recent Results (from the past 240 hour(s))  Culture, respiratory     Status: None   Collection Time: 09/06/20  6:27 AM   Specimen: Tracheal Aspirate; Respiratory  Result Value Ref Range Status   Specimen Description   Final    TRACHEAL ASPIRATE Performed at [facility], [address], [place], [place] [address]    Special Requests   Final    NONE Performed at [facility], [address] [place], [place] [address]    Gram Stain   Final    MODERATE  WBC PRESENT,BOTH PMN AND MONONUCLEAR ABUNDANT GRAM POSITIVE COCCI IN PAIRS IN CLUSTERS Performed at [facility], [place] [address], [place], [place] [address]    Culture MODERATE STAPHYLOCOCCUS AUREUS  Final   Report Status 09/08/2020 FINAL  Final   Organism ID, Bacteria STAPHYLOCOCCUS AUREUS  Final      Susceptibility   Staphylococcus aureus - MIC*    CIPROFLOXACIN <=0.5 SENSITIVE Sensitive     ERYTHROMYCIN >=8 RESISTANT Resistant     GENTAMICIN <=0.5 SENSITIVE Sensitive      OXACILLIN <=0.25 SENSITIVE Sensitive     TETRACYCLINE <=1 SENSITIVE Sensitive     VANCOMYCIN 1 SENSITIVE Sensitive     TRIMETH/SULFA <=10 SENSITIVE Sensitive     CLINDAMYCIN RESISTANT Resistant     RIFAMPIN <=0.5 SENSITIVE Sensitive     Inducible Clindamycin POSITIVE Resistant     * MODERATE STAPHYLOCOCCUS AUREUS  CULTURE, BLOOD (ROUTINE X 2) w Reflex to ID Panel     Status: Abnormal   Collection Time: 09/06/20  9:35 AM   Specimen: BLOOD  Result Value Ref Range Status   Specimen Description   Final    BLOOD LINE Performed at [facility], [address] [place], [place] [address]    Special Requests   Final    BOTTLES DRAWN AEROBIC AND ANAEROBIC Blood Culture adequate volume Performed at [facility], [address] [place], [place] [address]    Culture  Setup Time   Final    GRAM POSITIVE COCCI IN BOTH AEROBIC AND ANAEROBIC BOTTLES CRITICAL RESULT CALLED TO, READ BACK BY AND VERIFIED WITH: JASON ROBINS _0  ON 09/16/20 SKL Performed at [facility], [place] [address], [place], [place] [address]    Culture STAPHYLOCOCCUS AUREUS (A)  Final   Report Status 09/09/2020 FINAL  Final   Organism ID, Bacteria STAPHYLOCOCCUS AUREUS  Final      Susceptibility   Staphylococcus aureus - MIC*    CIPROFLOXACIN <=0.5 SENSITIVE Sensitive     ERYTHROMYCIN RESISTANT Resistant     GENTAMICIN <=0.5 SENSITIVE Sensitive     OXACILLIN 0.5 SENSITIVE Sensitive     TETRACYCLINE <=1 SENSITIVE Sensitive     VANCOMYCIN 1 SENSITIVE Sensitive     TRIMETH/SULFA <=10 SENSITIVE Sensitive     CLINDAMYCIN RESISTANT Resistant     RIFAMPIN <=0.5 SENSITIVE Sensitive     Inducible Clindamycin POSITIVE Resistant     * STAPHYLOCOCCUS AUREUS  Blood Culture ID Panel (Reflexed)     Status: Abnormal   Collection Time: 09/06/20  9:35 AM  Result Value Ref Range Status   Enterococcus faecalis NOT DETECTED NOT DETECTED Final   Enterococcus Faecium NOT DETECTED NOT DETECTED Final   Listeria  monocytogenes NOT DETECTED NOT DETECTED Final   Staphylococcus species DETECTED (A) NOT DETECTED Final    Comment: CRITICAL RESULT CALLED TO, READ BACK BY AND VERIFIED WITH: JASON ROBINS _1  ON 09/06/20 SKL    Staphylococcus aureus (BCID) DETECTED (A) NOT DETECTED Final    Comment: CRITICAL RESULT CALLED TO, READ BACK BY AND VERIFIED WITH: JASON ROBINS _2  ON 09/06/20 SKL    Staphylococcus epidermidis NOT DETECTED NOT DETECTED Final   Staphylococcus lugdunensis NOT DETECTED NOT DETECTED Final   Streptococcus species NOT DETECTED NOT DETECTED Final   Streptococcus agalactiae NOT DETECTED NOT DETECTED Final   Streptococcus pneumoniae NOT DETECTED NOT DETECTED Final   Streptococcus pyogenes NOT DETECTED NOT DETECTED Final   A.calcoaceticus-baumannii NOT DETECTED NOT DETECTED Final   Bacteroides fragilis NOT DETECTED NOT DETECTED  Final   Enterobacterales NOT DETECTED NOT DETECTED Final   Enterobacter cloacae complex NOT DETECTED NOT DETECTED Final   Escherichia coli NOT DETECTED NOT DETECTED Final   Klebsiella aerogenes NOT DETECTED NOT DETECTED Final   Klebsiella oxytoca NOT DETECTED NOT DETECTED Final   Klebsiella pneumoniae NOT DETECTED NOT DETECTED Final   Proteus species NOT DETECTED NOT DETECTED Final   Salmonella species NOT DETECTED NOT DETECTED Final   Serratia marcescens NOT DETECTED NOT DETECTED Final   Haemophilus influenzae NOT DETECTED NOT DETECTED Final   Neisseria meningitidis NOT DETECTED NOT DETECTED Final   Pseudomonas aeruginosa NOT DETECTED NOT DETECTED Final   Stenotrophomonas maltophilia NOT DETECTED NOT DETECTED Final   Candida albicans NOT DETECTED NOT DETECTED Final   Candida auris NOT DETECTED NOT DETECTED Final   Candida glabrata NOT DETECTED NOT DETECTED Final   Candida krusei NOT DETECTED NOT DETECTED Final   Candida parapsilosis NOT DETECTED NOT DETECTED Final   Candida tropicalis NOT DETECTED NOT DETECTED Final   Cryptococcus neoformans/gattii NOT  DETECTED NOT DETECTED Final   Meth resistant mecA/C and MREJ NOT DETECTED NOT DETECTED Final    Comment: Performed at [facility], [address] [place], [place] [address]  CULTURE, BLOOD (ROUTINE X 2) w Reflex to ID Panel     Status: Abnormal   Collection Time: 09/06/20 11:42 AM   Specimen: BLOOD  Result Value Ref Range Status   Specimen Description   Final    BLOOD LEFT HAND Performed at [facility], [address] [place], [place] [address]    Special Requests   Final    BOTTLES DRAWN AEROBIC AND ANAEROBIC Blood Culture results may not be optimal due to an inadequate volume of blood received in culture bottles Performed at [facility], [address] [place], [place] [address]    Culture  Setup Time   Final    GRAM POSITIVE COCCI IN BOTH AEROBIC AND ANAEROBIC BOTTLES CRITICAL VALUE NOTED.  VALUE IS CONSISTENT WITH PREVIOUSLY REPORTED AND CALLED VALUE. Performed at [facility], [address] [place], [place] [address]    Culture (A)  Final    STAPHYLOCOCCUS AUREUS SUSCEPTIBILITIES PERFORMED ON PREVIOUS CULTURE WITHIN THE LAST 5 DAYS. Performed at [facility], [address] [address] [place], [place] [address]    Report Status 09/09/2020 FINAL  Final  CULTURE, BLOOD (ROUTINE X 2) w Reflex to ID Panel     Status: None (Preliminary result)   Collection Time: 09/07/20 11:09 AM   Specimen: BLOOD  Result Value Ref Range Status   Specimen Description BLOOD LEFT ANTECUBITAL  Final   Special Requests   Final    BOTTLES DRAWN AEROBIC AND ANAEROBIC Blood Culture adequate volume   Culture   Final    NO GROWTH 4 DAYS Performed at [facility], [address], [place], [place] [address]    Report Status PENDING  Incomplete  CULTURE, BLOOD (ROUTINE X 2) w Reflex to ID Panel     Status: None (Preliminary result)   Collection Time: 09/09/20  1:55 PM   Specimen: BLOOD  Result Value Ref Range Status   Specimen Description BLOOD  BLOOD LEFT HAND  Final   Special Requests   Final    BOTTLES DRAWN AEROBIC AND ANAEROBIC Blood Culture adequate volume   Culture   Final    NO GROWTH 2 DAYS Performed at [facility], [address], [place], [place] [address]    Report Status  PENDING  Incomplete  CULTURE, BLOOD (ROUTINE X 2) w Reflex to ID Panel     Status: None (Preliminary result)   Collection Time: 09/09/20  2:08 PM   Specimen: BLOOD  Result Value Ref Range Status   Specimen Description BLOOD LEFT ANTECUBITAL  Final   Special Requests   Final    BOTTLES DRAWN AEROBIC AND ANAEROBIC Blood Culture adequate volume   Culture   Final    NO GROWTH 2 DAYS Performed at [facility], [address] [place], [place] [address]    Report Status PENDING  Incomplete    MEDICATIONS   Current Facility-Administered Medications:  .  0.9 %  sodium chloride infusion, 250 mL, Intravenous, PRN, Opyd, Ilene Qua, MD, Stopped at 08/22/20 0039 .  0.9 %  sodium chloride infusion, 250 mL, Intravenous, Continuous, Awilda Bill, NP, Stopped at 09/11/20 [phone] .  0.9 %  sodium chloride infusion, 250 mL, Intravenous, Continuous, Rust-Chester, Huel Cote, NP, Paused at 09/11/20 0043 .  albuterol (VENTOLIN HFA) 108 (90 Base) MCG/ACT inhaler 2 puff, 2 puff, Inhalation, Q6H PRN, Opyd, Ilene Qua, MD .  allopurinol (ZYLOPRIM) tablet 300 mg, 300 mg, Per Tube, Daily, Lorella Nimrod, MD, 300 mg at 09/11/20 0918 .  ceFAZolin (ANCEF) IVPB 2g/100 mL premix, 2 g, Intravenous, Q8H, [name], MD, Stopped at 09/11/20 0755 .  chlorhexidine gluconate (MEDLINE KIT) (PERIDEX) 0.12 % solution 15 mL, 15 mL, Mouth Rinse, BID, Rosine Door, MD, 15 mL at 09/11/20 0747 .  Chlorhexidine Gluconate Cloth 2 % PADS 6 each, 6 each, Topical, Daily, Ottie Glazier, MD, 6 each at 09/10/20 [phone] .  dexmedetomidine (PRECEDEX) 400 MCG/100ML (4 mcg/mL) infusion, 0.4-1.2 mcg/kg/hr, Intravenous, Titrated, Rosine Door, MD, Last Rate: 30.7 mL/hr at 09/11/20  1030, 1.2 mcg/kg/hr at 09/11/20 1030 .  dextrose 5 % in lactated ringers infusion, , Intravenous, Continuous, Rosine Door, MD, Last Rate: 75 mL/hr at 09/11/20 1000, Infusion Verify at 09/11/20 1000 .  enoxaparin (LOVENOX) injection 52.5 mg, 0.5 mg/kg, Subcutaneous, Q24H, Rosine Door, MD, 52.5 mg at 09/10/20 1733 .  famotidine (PEPCID) IVPB 20 mg premix, 20 mg, Intravenous, Q12H, Rosine Door, MD, Last Rate: 100 mL/hr at 09/11/20 1032, 20 mg at 09/11/20 1032 .  feeding supplement (PROSource TF) liquid 45 mL, 45 mL, Per Tube, TID, Rosine Door, MD, 45 mL at 09/11/20 [phone] .  feeding supplement (VITAL 1.5 CAL) liquid 1,000 mL, 1,000 mL, Per Tube, Continuous, Rosine Door, MD, Last Rate: 50 mL/hr at 09/11/20 0646, 1,000 mL at 09/11/20 0646 .  fentaNYL 2526mg in NS 2571m(1030mml) infusion-PREMIX, 0-400 mcg/hr, Intravenous, Continuous, BasRosine DoorD, Last Rate: 40 mL/hr at 09/11/20 1000, 400 mcg/hr at 09/11/20 1000 .  hydrALAZINE (APRESOLINE) injection 20 mg, 20 mg, Intravenous, Q4H PRN, BasRosine DoorD, 20 mg at 09/05/20 1640 .  ibuprofen (ADVIL) 100 MG/5ML suspension 400 mg, 400 mg, Oral, Once, BasRosine DoorD .  insulin aspart (novoLOG) injection 2-6 Units, 2-6 Units, Subcutaneous, Q4H, KasFlora LippsD, 6 Units at 09/11/20 0754 .  MEDLINE mouth rinse, 15 mL, Mouth Rinse, 10 times per day, BasRosine DoorD, 15 mL at 09/11/20 1008 .  norepinephrine (LEVOPHED) 4mg79m 250mL75mmix infusion, 2-10 mcg/min, Intravenous, Titrated, Rust-Chester, Britton L, NP, Last Rate: 26.3 mL/hr at 09/11/20 1010, 7 mcg/min at 09/11/20 1010 .  ondansetron (ZOFRAN) tablet 4 mg, 4 mg, Oral, Q6H PRN **OR** ondansetron (ZOFRAN) injection 4 mg, 4 mg, Intravenous, Q6H PRN, Opyd, TimotIlene Qua.  oxyCODONE (Oxy IR/ROXICODONE) immediate release  tablet 5 mg, 5 mg, Per Tube, Q6H, [name], MD, 5 mg at 09/09/20 2302 .  potassium chloride 10 mEq in 100 mL IVPB, 10 mEq, Intravenous, Q1 Hr x 4, Dallie Piles,  RPH, Last Rate: 100 mL/hr at 09-27-20 1126, 10 mEq at 09/27/2020 1126 .  propofol (DIPRIVAN) 1000 MG/100ML infusion, 5-80 mcg/kg/min, Intravenous, Titrated, Rosine Door, MD, Last Rate: 50.4 mL/hr at 2020-09-27 1056, 80 mcg/kg/min at 09-27-20 1056 .  valproate (DEPACON) 250 mg in dextrose 5 % 50 mL IVPB, 250 mg, Intravenous, Q12H, Rosine Door, MD, Last Rate: 52.5 mL/hr at 27-Sep-2020 1000, Infusion Verify at September 27, 2020 1000      Indwelling Urinary Catheter continued, requirement due to   Reason to continue Indwelling Urinary Catheter for strict Intake/Output monitoring for hemodynamic instability   Central Line continued, requirement due to   Reason to continue [employer] Monitoring of central venous pressure or other hemodynamic parameters   Ventilator continued, requirement due to, resp failure    Ventilator Sedation RASS 0 to -2     ASSESSMENT AND PLAN SYNOPSIS  Acute Hypoxic Respiratory Failurein the setting of COVID-19 Pneumonia with severe ARDS perforated diverticulitis s/p colostomy with reversal and leakage of anastomosis with s/p revision now acutely hypoxemic with COVID19 induced severe ARDS,complicated by delirium and agitation likely needsNeeds Trach for Survivalas gets very agitated Reintubated 09/09/20 Failed extubation x 2. Cont , propofol/fentanyl/precedex -continue Full MV support -continue Bronchodilator Therapy -Wean Fio2 and PEEP as tolerated -VAP/VENT bundle implementation Off  steroids  Trach likely 1/25 Surgical G tube requested  MSSA pneumonia/Bacteremia 09/06/20 Repeat blood c/s from  1/21 remain negative On IV Ancef PICC line and mid line removed 09/07/20 Echo 09/07/20 EF 55 to 60%. Limited study, valves not well visualized. Will need TEE,requested    ACUTE DIASTOLIC CARDIAC FAILURE- -oxygen as needed -Lasix as tolerated  Morbid obesity, possible OSA.  On vent,   AMS with encephalopathy with underlying anxiey  Due to [place] and  toxic metabolic encephalopathy - partially due to sedation  started depacon 1/21   Perforated diverticulitis - patent s/p colostomy  - surgery on case    CARDIAC ICU monitoring    GI PROPHYLAXIS as indicated   DIET-->TF's as tolerated Constipation protocol as indicated  ENDO - will use ICU hypoglycemic\Hyperglycemia protocol if indicated Cont insulin    ELECTROLYTES -follow labs as needed -replace as needed -pharmacy consultation and following   DVT/GI PRX ordered and assessed TRANSFUSIONS AS NEEDED MONITOR FSBS I Assessed the need for Labs I Assessed the need for Foley I Assessed the need for Central Venous Line Family Discussion when available I Assessed the need for Mobilization I made an Assessment of medications to be adjusted accordingly Safety Risk assessment completed    Wife at bedside, discussed in detail with her   Critical Care Time devoted to patient care services described in this note is 31  minutes.   Overall, patient is critically ill, prognosis is guarded.  Patient with Multiorgan failure and at high risk for cardiac arrest and death.   Rosine Door, MD  27-Sep-2020 11:31 AM [facility]

## 2020-09-12 ENCOUNTER — Encounter: Payer: Self-pay | Admitting: Family Medicine

## 2020-09-12 DIAGNOSIS — R7881 Bacteremia: Secondary | ICD-10-CM | POA: Diagnosis not present

## 2020-09-12 DIAGNOSIS — U071 COVID-19: Secondary | ICD-10-CM | POA: Diagnosis not present

## 2020-09-12 DIAGNOSIS — A419 Sepsis, unspecified organism: Secondary | ICD-10-CM | POA: Diagnosis not present

## 2020-09-12 DIAGNOSIS — G9341 Metabolic encephalopathy: Secondary | ICD-10-CM | POA: Diagnosis not present

## 2020-09-12 LAB — RENAL FUNCTION PANEL
Albumin: 2.4 g/dL — ABNORMAL LOW (ref 3.5–5.0)
Albumin: 2.1 g/dL — ABNORMAL LOW (ref 3.5–5.0)
Anion gap: 12 (ref 5–15)
Anion gap: 13 (ref 5–15)
BUN: 24 mg/dL — ABNORMAL HIGH (ref 6–20)
BUN: <5 mg/dL — ABNORMAL LOW (ref 6–20)
CO2: 28 mmol/L (ref 22–32)
CO2: 25 mmol/L (ref 22–32)
Calcium: 7.9 mg/dL — ABNORMAL LOW (ref 8.9–10.3)
Calcium: 7.9 mg/dL — ABNORMAL LOW (ref 8.9–10.3)
Chloride: 98 mmol/L (ref 98–111)
Chloride: 108 mmol/L (ref 98–111)
Creatinine, Ser: <0.3 mg/dL — ABNORMAL LOW (ref 0.61–1.24)
Creatinine, Ser: <0.3 mg/dL — ABNORMAL LOW (ref 0.61–1.24)
Glucose, Bld: 108 mg/dL — ABNORMAL HIGH (ref 70–99)
Glucose, Bld: 231 mg/dL — ABNORMAL HIGH (ref 70–99)
Phosphorus: 3.1 mg/dL (ref 2.5–4.6)
Phosphorus: 1.9 mg/dL — ABNORMAL LOW (ref 2.5–4.6)
Potassium: 3.1 mmol/L — ABNORMAL LOW (ref 3.5–5.1)
Potassium: 3.1 mmol/L — ABNORMAL LOW (ref 3.5–5.1)
Sodium: 138 mmol/L (ref 135–145)
Sodium: 146 mmol/L — ABNORMAL HIGH (ref 135–145)

## 2020-09-12 LAB — GLUCOSE, CAPILLARY
Glucose-Capillary: 198 mg/dL — ABNORMAL HIGH (ref 70–99)
Glucose-Capillary: 204 mg/dL — ABNORMAL HIGH (ref 70–99)
Glucose-Capillary: 207 mg/dL — ABNORMAL HIGH (ref 70–99)
Glucose-Capillary: 215 mg/dL — ABNORMAL HIGH (ref 70–99)
Glucose-Capillary: 218 mg/dL — ABNORMAL HIGH (ref 70–99)
Glucose-Capillary: 229 mg/dL — ABNORMAL HIGH (ref 70–99)

## 2020-09-12 LAB — CBC WITH DIFFERENTIAL/PLATELET
Abs Immature Granulocytes: 0.14 10*3/uL — ABNORMAL HIGH (ref 0.00–0.07)
Basophils Absolute: 0 10*3/uL (ref 0.0–0.1)
Basophils Relative: 0 %
Eosinophils Absolute: 0.1 10*3/uL (ref 0.0–0.5)
Eosinophils Relative: 2 %
HCT: 29.4 % — ABNORMAL LOW (ref 39.0–52.0)
Hemoglobin: 9.2 g/dL — ABNORMAL LOW (ref 13.0–17.0)
Immature Granulocytes: 2 %
Lymphocytes Relative: 12 %
Lymphs Abs: 0.8 10*3/uL (ref 0.7–4.0)
MCH: 28.5 pg (ref 26.0–34.0)
MCHC: 31.3 g/dL (ref 30.0–36.0)
MCV: 91 fL (ref 80.0–100.0)
Monocytes Absolute: 0.4 10*3/uL (ref 0.1–1.0)
Monocytes Relative: 5 %
Neutro Abs: 5.4 10*3/uL (ref 1.7–7.7)
Neutrophils Relative %: 79 %
Platelets: 141 10*3/uL — ABNORMAL LOW (ref 150–400)
RBC: 3.23 MIL/uL — ABNORMAL LOW (ref 4.22–5.81)
RDW: 17 % — ABNORMAL HIGH (ref 11.5–15.5)
WBC: 6.8 10*3/uL (ref 4.0–10.5)
nRBC: 0 % (ref 0.0–0.2)

## 2020-09-12 LAB — CULTURE, BLOOD (ROUTINE X 2)
Culture: NO GROWTH
Special Requests: ADEQUATE

## 2020-09-12 LAB — TRIGLYCERIDES: Triglycerides: 265 mg/dL — ABNORMAL HIGH (ref <150)

## 2020-09-12 LAB — MAGNESIUM: Magnesium: 1.4 mg/dL — ABNORMAL LOW (ref 1.7–2.4)

## 2020-09-12 MED ORDER — MAGNESIUM SULFATE 4 GM/100ML IV SOLN
4.0000 g | Freq: Once | INTRAVENOUS | Status: AC
  Administered 2020-09-12: 4 g via INTRAVENOUS
  Filled 2020-09-12: qty 100

## 2020-09-12 MED ORDER — POTASSIUM CHLORIDE 20 MEQ PO PACK
40.0000 meq | PACK | Freq: Once | ORAL | Status: AC
  Administered 2020-09-12: 40 meq
  Filled 2020-09-12: qty 2

## 2020-09-12 MED ORDER — POTASSIUM & SODIUM PHOSPHATES 280-160-250 MG PO PACK
2.0000 | PACK | ORAL | Status: AC
  Administered 2020-09-12 (×2): 2
  Filled 2020-09-12 (×2): qty 2

## 2020-09-12 MED ORDER — ACETAMINOPHEN 325 MG PO TABS
650.0000 mg | ORAL_TABLET | Freq: Four times a day (QID) | ORAL | Status: DC | PRN
  Administered 2020-09-14 – 2020-09-24 (×18): 650 mg
  Filled 2020-09-12 (×18): qty 2

## 2020-09-12 NOTE — Progress Notes (Signed)
09/12/2020 4:42 PM  Craig [name] [id number]   Patient remains intubated and sedated, unable to extubate.    Temp:  [98.9 F (37.2 C)-100.58 F (38.1 C)] 100.04 F (37.8 C) (01/24 1115) Resp:  [20-38] 24 (01/24 1115) BP: (88-163)/(33-92) 89/51 (01/24 1115) SpO2:  [90 %-100 %] 100 % (01/24 1115) FiO2 (%):  [28 %-40 %] 35 % (01/24 1354),     Intake/Output Summary (Last 24 hours) at 09/12/2020 1642 Last data filed at 09/12/2020 1358 Gross per 24 hour  Intake 6015.98 ml  Output 2620 ml  Net 3395.98 ml    Results for orders placed or performed during the hospital encounter of 08/09/20 (from the past 24 hour(s))  Glucose, capillary     Status: Abnormal   Collection Time: 09/11/20  8:04 PM  Result Value Ref Range   Glucose-Capillary 199 (H) 70 - 99 mg/dL  Glucose, capillary     Status: Abnormal   Collection Time: 09/12/20 12:15 AM  Result Value Ref Range   Glucose-Capillary 204 (H) 70 - 99 mg/dL  Magnesium     Status: Abnormal   Collection Time: 09/12/20  3:35 AM  Result Value Ref Range   Magnesium 1.4 (L) 1.7 - 2.4 mg/dL  Renal function panel     Status: Abnormal   Collection Time: 09/12/20  3:35 AM  Result Value Ref Range   Sodium 146 (H) 135 - 145 mmol/L   Potassium 3.1 (L) 3.5 - 5.1 mmol/L   Chloride 108 98 - 111 mmol/L   CO2 25 22 - 32 mmol/L   Glucose, Bld 231 (H) 70 - 99 mg/dL   BUN <5 (L) 6 - 20 mg/dL   Creatinine, Ser <0.30 (L) 0.61 - 1.24 mg/dL   Calcium 7.9 (L) 8.9 - 10.3 mg/dL   Phosphorus 1.9 (L) 2.5 - 4.6 mg/dL   Albumin 2.1 (L) 3.5 - 5.0 g/dL   GFR, Estimated NOT CALCULATED >60 mL/min   Anion gap 13 5 - 15  Triglycerides     Status: Abnormal   Collection Time: 09/12/20  3:35 AM  Result Value Ref Range   Triglycerides 265 (H) <150 mg/dL  CBC with Differential/Platelet     Status: Abnormal   Collection Time: 09/12/20  3:35 AM  Result Value Ref Range   WBC 6.8 4.0 - 10.5 K/uL   RBC 3.23 (L) 4.22 - 5.81 MIL/uL   Hemoglobin 9.2 (L) 13.0 - 17.0  g/dL   HCT 29.4 (L) 39.0 - 52.0 %   MCV 91.0 80.0 - 100.0 fL   MCH 28.5 26.0 - 34.0 pg   MCHC 31.3 30.0 - 36.0 g/dL   RDW 17.0 (H) 11.5 - 15.5 %   Platelets 141 (L) 150 - 400 K/uL   nRBC 0.0 0.0 - 0.2 %   Neutrophils Relative % 79 %   Neutro Abs 5.4 1.7 - 7.7 K/uL   Lymphocytes Relative 12 %   Lymphs Abs 0.8 0.7 - 4.0 K/uL   Monocytes Relative 5 %   Monocytes Absolute 0.4 0.1 - 1.0 K/uL   Eosinophils Relative 2 %   Eosinophils Absolute 0.1 0.0 - 0.5 K/uL   Basophils Relative 0 %   Basophils Absolute 0.0 0.0 - 0.1 K/uL   Immature Granulocytes 2 %   Abs Immature Granulocytes 0.14 (H) 0.00 - 0.07 K/uL  Glucose, capillary     Status: Abnormal   Collection Time: 09/12/20  3:59 AM  Result Value Ref Range   Glucose-Capillary 207 (H) 70 - 99 mg/dL  Glucose, capillary     Status: Abnormal   Collection Time: 09/12/20  7:38 AM  Result Value Ref Range   Glucose-Capillary 218 (H) 70 - 99 mg/dL  Glucose, capillary     Status: Abnormal   Collection Time: 09/12/20 12:18 PM  Result Value Ref Range   Glucose-Capillary 229 (H) 70 - 99 mg/dL      IMPRESSION:  Failure to extubate.  PLAN:  Scheduled for tracheostomy in the morning at 730am.  Tube feeds held at midnight, anticoagulation held.    Craig [name] 09/12/2020, 4:42 PM

## 2020-09-12 NOTE — Progress Notes (Signed)
[facility] for Electrolyte Monitoring and Replacement   Recent Labs: Potassium (mmol/L)  Date Value  09/12/2020 3.1 (L)   Magnesium (mg/dL)  Date Value  09/12/2020 1.4 (L)   Calcium (mg/dL)  Date Value  09/12/2020 7.9 (L)   Albumin (g/dL)  Date Value  09/12/2020 2.1 (L)   Phosphorus (mg/dL)  Date Value  09/12/2020 1.9 (L)   Sodium (mmol/L)  Date Value  09/12/2020 146 (H)   Corrected Ca: 8.9 mg/dL  Assessment: 30 yo male admitted with perforated diverticulitis and severe acute hypoxemic respiratory failure secondary to COVID-19 infection. Patient has severe ARDs and was reintubated 1/21. Pharmacy has been consulted for electrolyte monitoring and replacement.   Goal of Therapy:  Electrolytes WNL   Plan:  1/24 AM labs: Na 146, K 3.1, Phos 1.9, Mg 1.4  Continue to monitor sodium  KCl 40 mEq x 1  Phos-Nak 2 packets q4h x 2 doses  Magnesium sulfate 4 g IV x 1  Recheck electrolytes in AM  Pharmacy will continue to follow and replace electrolytes as needed.   Benita Gutter 09/12/2020 9:33 AM

## 2020-09-12 NOTE — Progress Notes (Signed)
CRITICAL CARE NOTE 30 yo male recent hospitalization for perforated diverticulitis s/p colostomy with reversal and leakage of anastomosis with s/p revision now acutely hypoxemic with COVID19 induced severe ARDS.   INTERVAL EVENTS: 08/11/20-patient continued to decline with worsening respiratory status despite 100%Fio2 on BIPAP and HFNC failure. He stated that he feels he is dying and cannot breathe. I discussed with wife [name] earlier today that patient is critically ill may need ETT, she is agreeable and thankful for care. I specifically discussed and explained intubation and mechanical ventilation to patient and he wishes to proceed.  08/12/20- patient was weaned on FiO2 to 60%. Spoke to wife [name].  08/13/20- Patient weaned to 40%. During SBT today he self extubated and was placed on HFNC. He subsequently became hypoxemic, disoriented aggitated, removed PIVs and colostomy and proceeded to have combative behavior with worsening oxygenation. I have attempted to calm patient and encouraged him to cooperate. After numerous attempts patient continued to be combative with severe aggitation with combative behavior and would not wear oxygen. Patient was placed on sedation and MV. Wife updated.  08/14/20-patient remains critically ill. Weaned from 100%>>55%. Called and updated wife [name] today 12/27-Patient with worsening oxygenation on FiO2 100% peep of 14 08/16/20-Patient with continued high oxygen requirements overnight. Have spoken with surgery and there should be no problem proning patient with this. Was febrile yesterday and thus broad spectrum abx started and cultures attained.  08-17-20-patient down to PEEP 10 FiO2 55%. Patient never required proning 08/18/20:FiO2 weaned to 40%, PEEP 10, plan for diuresis with Diamox today, adding PO Clonazepam to assist weaning sedation 08/19/20: Vent changed to Pressure Control this morning: 50% FiO2, 24, 22/5, Plan for Recruitment maneuvers and  diuresis 08/20/2020:Continued issues with hypercapnia due to the dead space ventilation. Worsening despite pressure control. Switch back to [facility], prone positioning 08/23/19- patient proned today without incident, continue to wean FiO2 , currently on 50% 08/24/19-patient weaned to 40% FiO2, plan to continue proning with full scope of care. I have spoken to [name] (wife of patient today) she would like to be present prior to next extubation attempt to help console patient and decrease his aggitation. 1/36filed weaning trials, ver agitated , severe hypoxia, Wife at bedside witnessed failed SAT. 1/6 severe resp failure, hypoxia 1/7 proning for 16 hrs, severe ARDS 1/8 PICC line placed, Had a brief PEA arrest while prone. Proningdiscontinued 1/10severe resp failure 1/11severe ARDS, [facility] failure 09/02/20- patient with no acute events.  Spoke with TJonelle Sidletoday answered questions and plan is for trache.  09/03/20- patient remains on MV. FiO2 down to 35%PRVC plan for trache. 09/04/20- patient with TG>1000 will switch off propofol but patient with very high sedation requirement, will discuss options with pharmD.  Plan for trache.  Met with wife [name] at bedside.  1/20 extubated, wife at bedside, very weak cough, encephalopathy 1/21 re-intubated, severe resp failure [facility]  [facility]  follow up respiratory failure  SUBJECTIVE Patient remains critically ill Prognosis is guarded  Vent Mode: PRVC FiO2 (%):  [28 %-40 %] 40 % Set Rate:  [24 bmp] 24 bmp Vt Set:  [500 mL] 500 mL PEEP:  [5 cmH20] 5 cmH20 Plateau Pressure:  [19 cmH20-22 cmH20] 19 cmH20  CBC    Component Value Date/Time   WBC 6.8 09/12/2020 0335   RBC 3.23 (L) 09/12/2020 0335   HGB 9.2 (L) 09/12/2020 0335   HCT 29.4 (L) 09/12/2020 0335   PLT 141 (L) 09/12/2020 0335  MCV 91.0 09/12/2020 0335   MCH 28.5 09/12/2020 0335   MCHC 31.3 09/12/2020 0335   RDW 17.0 (H) 09/12/2020  0335   LYMPHSABS 0.8 09/12/2020 0335   MONOABS 0.4 09/12/2020 0335   EOSABS 0.1 09/12/2020 0335   BASOSABS 0.0 09/12/2020 0335   BMP Latest Ref Rng & Units 09/12/2020 09/11/2020 09/10/2020  Glucose 70 - 99 mg/dL 231(H) 174(H) 129(H)  BUN 6 - 20 mg/dL <5(L) <5(L) 8  Creatinine 0.61 - 1.24 mg/dL <0.30(L) <0.30(L) <0.30(L)  Sodium 135 - 145 mmol/L 146(H) 142 139  Potassium 3.5 - 5.1 mmol/L 3.1(L) 3.0(L) 2.6(LL)  Chloride 98 - 111 mmol/L 108 108 104  CO2 22 - 32 mmol/L '25 25 26  ' Calcium 8.9 - 10.3 mg/dL 7.9(L) 7.7(L) 7.9(L)    BP (!) 113/37   Pulse (!) 102   Temp 100.3 F (37.9 C) (Oral)   Resp (!) 24   Ht 6' 0.01" (1.829 m)   Wt 107.6 kg   SpO2 98%   BMI 32.16 kg/m    I/O last 3 completed shifts: In: 11270.1 [I.V.:7459.3; NG/GT:2143.8; IV Piggyback:1667] Out: 7035 [KKXFG:1829; Stool:420] No intake/output data recorded.  SpO2: 98 % O2 Flow Rate (L/min): 40 L/min FiO2 (%): (S) 40 %  Estimated body mass index is 32.16 kg/m as calculated from the following:   Height as of this encounter: 6' 0.01" (1.829 m).   Weight as of this encounter: 107.6 kg.  SIGNIFICANT EVENTS   REVIEW OF SYSTEMS  PATIENT IS UNABLE TO PROVIDE COMPLETE REVIEW OF SYSTEMS DUE TO SEVERE CRITICAL ILLNESS        PHYSICAL EXAMINATION:  GENERAL:critically ill appearing, +resp distress HEAD: Normocephalic, atraumatic.  EYES: Pupils equal, round, reactive to light.  No scleral icterus.  MOUTH: Moist mucosal membrane. NECK: Supple.  PULMONARY: +rhonchi, +wheezing CARDIOVASCULAR: S1 and S2. Regular rate and rhythm. No murmurs, rubs, or gallops.  GASTROINTESTINAL: Soft, nontender, -distended.  Positive bowel sounds.   MUSCULOSKELETAL: No swelling, clubbing, or edema.  NEUROLOGIC: obtunded, GCS<8 SKIN:intact,warm,dry  MEDICATIONS: I have reviewed all medications and confirmed regimen as documented   CULTURE RESULTS   Recent Results (from the past 240 hour(s))  Culture, respiratory     Status:  None   Collection Time: 09/06/20  6:27 AM   Specimen: Tracheal Aspirate; Respiratory  Result Value Ref Range Status   Specimen Description   Final    TRACHEAL ASPIRATE Performed at [facility], [address] [place], [place] [address]    Special Requests   Final    NONE Performed at [facility], [address] [place], [place] [address]    Gram Stain   Final    MODERATE WBC PRESENT,BOTH PMN AND MONONUCLEAR ABUNDANT GRAM POSITIVE COCCI IN PAIRS IN CLUSTERS Performed at [facility], [place] [address], [place], [place] [address]    Culture MODERATE STAPHYLOCOCCUS AUREUS  Final   Report Status 09/08/2020 FINAL  Final   Organism ID, Bacteria STAPHYLOCOCCUS AUREUS  Final      Susceptibility   Staphylococcus aureus - MIC*    CIPROFLOXACIN <=0.5 SENSITIVE Sensitive     ERYTHROMYCIN >=8 RESISTANT Resistant     GENTAMICIN <=0.5 SENSITIVE Sensitive     OXACILLIN <=0.25 SENSITIVE Sensitive     TETRACYCLINE <=1 SENSITIVE Sensitive     VANCOMYCIN 1 SENSITIVE Sensitive     TRIMETH/SULFA <=10 SENSITIVE Sensitive     CLINDAMYCIN RESISTANT Resistant     RIFAMPIN <=0.5 SENSITIVE Sensitive     Inducible Clindamycin  POSITIVE Resistant     * MODERATE STAPHYLOCOCCUS AUREUS  CULTURE, BLOOD (ROUTINE X 2) w Reflex to ID Panel     Status: Abnormal   Collection Time: 09/06/20  9:35 AM   Specimen: BLOOD  Result Value Ref Range Status   Specimen Description   Final    BLOOD LINE Performed at [facility], [address] [place], [place] [address]    Special Requests   Final    BOTTLES DRAWN AEROBIC AND ANAEROBIC Blood Culture adequate volume Performed at [facility], [address] [place], [place] [address]    Culture  Setup Time   Final    GRAM POSITIVE COCCI IN BOTH AEROBIC AND ANAEROBIC BOTTLES CRITICAL RESULT CALLED TO, READ BACK BY AND VERIFIED WITH: JASON ROBINS '@[id number]'  ON 09/16/20 SKL Performed at [facility], [place] [address],  [place], [place] [address]    Culture STAPHYLOCOCCUS AUREUS (A)  Final   Report Status 09/09/2020 FINAL  Final   Organism ID, Bacteria STAPHYLOCOCCUS AUREUS  Final      Susceptibility   Staphylococcus aureus - MIC*    CIPROFLOXACIN <=0.5 SENSITIVE Sensitive     ERYTHROMYCIN RESISTANT Resistant     GENTAMICIN <=0.5 SENSITIVE Sensitive     OXACILLIN 0.5 SENSITIVE Sensitive     TETRACYCLINE <=1 SENSITIVE Sensitive     VANCOMYCIN 1 SENSITIVE Sensitive     TRIMETH/SULFA <=10 SENSITIVE Sensitive     CLINDAMYCIN RESISTANT Resistant     RIFAMPIN <=0.5 SENSITIVE Sensitive     Inducible Clindamycin POSITIVE Resistant     * STAPHYLOCOCCUS AUREUS  Blood Culture ID Panel (Reflexed)     Status: Abnormal   Collection Time: 09/06/20  9:35 AM  Result Value Ref Range Status   Enterococcus faecalis NOT DETECTED NOT DETECTED Final   Enterococcus Faecium NOT DETECTED NOT DETECTED Final   Listeria monocytogenes NOT DETECTED NOT DETECTED Final   Staphylococcus species DETECTED (A) NOT DETECTED Final    Comment: CRITICAL RESULT CALLED TO, READ BACK BY AND VERIFIED WITH: JASON ROBINS '@[id number]'  ON 09/06/20 SKL    Staphylococcus aureus (BCID) DETECTED (A) NOT DETECTED Final    Comment: CRITICAL RESULT CALLED TO, READ BACK BY AND VERIFIED WITH: JASON ROBINS '@[id number]'  ON 09/06/20 SKL    Staphylococcus epidermidis NOT DETECTED NOT DETECTED Final   Staphylococcus lugdunensis NOT DETECTED NOT DETECTED Final   Streptococcus species NOT DETECTED NOT DETECTED Final   Streptococcus agalactiae NOT DETECTED NOT DETECTED Final   Streptococcus pneumoniae NOT DETECTED NOT DETECTED Final   Streptococcus pyogenes NOT DETECTED NOT DETECTED Final   A.calcoaceticus-baumannii NOT DETECTED NOT DETECTED Final   Bacteroides fragilis NOT DETECTED NOT DETECTED Final   Enterobacterales NOT DETECTED NOT DETECTED Final   Enterobacter cloacae complex NOT DETECTED NOT DETECTED Final   Escherichia coli NOT DETECTED NOT DETECTED Final   Klebsiella  aerogenes NOT DETECTED NOT DETECTED Final   Klebsiella oxytoca NOT DETECTED NOT DETECTED Final   Klebsiella pneumoniae NOT DETECTED NOT DETECTED Final   Proteus species NOT DETECTED NOT DETECTED Final   Salmonella species NOT DETECTED NOT DETECTED Final   Serratia marcescens NOT DETECTED NOT DETECTED Final   Haemophilus influenzae NOT DETECTED NOT DETECTED Final   Neisseria meningitidis NOT DETECTED NOT DETECTED Final   Pseudomonas aeruginosa NOT DETECTED NOT DETECTED Final   Stenotrophomonas maltophilia NOT DETECTED NOT DETECTED Final   Candida albicans NOT DETECTED NOT DETECTED Final   Candida auris NOT DETECTED NOT DETECTED Final   Candida glabrata  NOT DETECTED NOT DETECTED Final   Candida krusei NOT DETECTED NOT DETECTED Final   Candida parapsilosis NOT DETECTED NOT DETECTED Final   Candida tropicalis NOT DETECTED NOT DETECTED Final   Cryptococcus neoformans/gattii NOT DETECTED NOT DETECTED Final   Meth resistant mecA/C and MREJ NOT DETECTED NOT DETECTED Final    Comment: Performed at [facility], [address] [place], [place] [address]  CULTURE, BLOOD (ROUTINE X 2) w Reflex to ID Panel     Status: Abnormal   Collection Time: 09/06/20 11:42 AM   Specimen: BLOOD  Result Value Ref Range Status   Specimen Description   Final    BLOOD LEFT HAND Performed at [facility], [address], [place], [place] [address]    Special Requests   Final    BOTTLES DRAWN AEROBIC AND ANAEROBIC Blood Culture results may not be optimal due to an inadequate volume of blood received in culture bottles Performed at [facility], [address] [place], [place] [address]    Culture  Setup Time   Final    GRAM POSITIVE COCCI IN BOTH AEROBIC AND ANAEROBIC BOTTLES CRITICAL VALUE NOTED.  VALUE IS CONSISTENT WITH PREVIOUSLY REPORTED AND CALLED VALUE. Performed at [facility], [address] [place], [place] [address]    Culture (A)  Final    STAPHYLOCOCCUS  AUREUS SUSCEPTIBILITIES PERFORMED ON PREVIOUS CULTURE WITHIN THE LAST 5 DAYS. Performed at [facility], [place] [address] [place], [place] [address]    Report Status 09/09/2020 FINAL  Final  CULTURE, BLOOD (ROUTINE X 2) w Reflex to ID Panel     Status: None   Collection Time: 09/07/20 11:09 AM   Specimen: BLOOD  Result Value Ref Range Status   Specimen Description BLOOD LEFT ANTECUBITAL  Final   Special Requests   Final    BOTTLES DRAWN AEROBIC AND ANAEROBIC Blood Culture adequate volume   Culture   Final    NO GROWTH 5 DAYS Performed at [facility], [address] [place], [place] [address]    Report Status 09/12/2020 FINAL  Final  CULTURE, BLOOD (ROUTINE X 2) w Reflex to ID Panel     Status: None (Preliminary result)   Collection Time: 09/09/20  1:55 PM   Specimen: BLOOD  Result Value Ref Range Status   Specimen Description BLOOD BLOOD LEFT HAND  Final   Special Requests   Final    BOTTLES DRAWN AEROBIC AND ANAEROBIC Blood Culture adequate volume   Culture   Final    NO GROWTH 3 DAYS Performed at [facility], [address], [place], [place] [address]    Report Status PENDING  Incomplete  CULTURE, BLOOD (ROUTINE X 2) w Reflex to ID Panel     Status: None (Preliminary result)   Collection Time: 09/09/20  2:08 PM   Specimen: BLOOD  Result Value Ref Range Status   Specimen Description BLOOD LEFT ANTECUBITAL  Final   Special Requests   Final    BOTTLES DRAWN AEROBIC AND ANAEROBIC Blood Culture adequate volume   Culture   Final    NO GROWTH 3 DAYS Performed at [facility], [address] [place], [place] [address]    Report Status PENDING  Incomplete          IMAGING    No results found.   Nutrition Status: Nutrition Problem: Increased nutrient needs Etiology: catabolic illness ([id number]) Signs/Symptoms: estimated needs Interventions: Refer to RD note for recommendations  Indwelling Urinary Catheter continued,  requirement due to   Reason to continue Indwelling Urinary Catheter strict Intake/Output monitoring for hemodynamic instability         Ventilator continued, requirement due to severe respiratory failure   Ventilator Sedation RASS 0 to -2      ASSESSMENT AND PLAN SYNOPSIS 30 yo male recent hospitalization for perforated diverticulitis s/p colostomy with reversal and leakage of anastomosis with s/p revision now acutely hypoxemic with COVID19 induced severe ARDS. failure to wean from VENT-self extubation and trial of extubation has failed Severe encephalopathy l;eading to inability to protect airway  Severe ACUTE Hypoxic and Hypercapnic Respiratory Failure -continue Full MV support -continue Bronchodilator Therapy -Wean Fio2 and PEEP as tolerated -VAP/VENT bundle implementation Plan for trach  ACUTE DIASTOLIC CARDIAC FAILURE-  -oxygen as needed -Lasix as tolerated  Morbid obesity, possible OSA.   Will certainly impact respiratory mechanics, ventilator weaning Suspect will need to consider additional PEEP     NEUROLOGY Acute toxic metabolic encephalopathy, need for sedation Goal RASS -2 to -3   CARDIAC ICU monitoring  INFECTIOUS DISEASE-MSSA bacteremia and MSSA multifocal pneumonia -continue antibiotics as prescribed -follow up cultures -follow up ID consultation  Antibiotic history Ceftriaxone 12/21>>12/26 Azithromycin 12/21>>12/23 Zosyn 12/27>>12/31 Vanco 12/27-1 dose Linezolid 12/28>>12/31 08/20/2020>>09/05/20 no antibiotics 1/18 one dose of cefepime 1/18 cefazolin>>  Micro 12/21 BC-NG 12/21 UC-NG 12/27 BC NG 1/18 BC- MSSA 1/18 Resp culture MSSA    GI GI PROPHYLAXIS as indicated  NUTRITIONAL STATUS Nutrition Status: Nutrition Problem: Increased nutrient needs Etiology: catabolic illness ([id number]) Signs/Symptoms: estimated needs Interventions: Refer to RD note for recommendations   DIET-->TF's as tolerated Constipation protocol as  indicated  ENDO - will use ICU hypoglycemic\Hyperglycemia protocol if indicated     ELECTROLYTES -follow labs as needed -replace as needed -pharmacy consultation and following   DVT/GI PRX ordered and assessed TRANSFUSIONS AS NEEDED MONITOR FSBS I Assessed the need for Labs I Assessed the need for Foley I Assessed the need for Central Venous Line Family Discussion when available I Assessed the need for Mobilization I made an Assessment of medications to be adjusted accordingly Safety Risk assessment completed   CASE DISCUSSED IN MULTIDISCIPLINARY ROUNDS WITH ICU TEAM  Critical Care Time devoted to patient care services described in this note is 45  minutes.   Overall, patient is critically ill, prognosis is guarded.   Plan for [facility] and PEG tube   Corrin Parker, M.D.  [facility]  Medical Director [place] Director [facility] Cardio-Pulmonary Department

## 2020-09-12 NOTE — Anesthesia Preprocedure Evaluation (Addendum)
Anesthesia Evaluation  Patient identified by MRN, date of birth, ID band Patient confused    Reviewed: Allergy & Precautions, H&P , NPO status , Patient's Chart, lab work & pertinent test results  History of Anesthesia Complications Negative for: history of anesthetic complications  Airway Mallampati: Intubated  TM Distance: <3 FB Neck ROM: limited    Dental  (+) Chipped   Pulmonary pneumonia, unresolved,    + rhonchi  + decreased breath sounds      Cardiovascular hypertension, Normal cardiovascular exam     Neuro/Psych negative neurological ROS  negative psych ROS   GI/Hepatic negative GI ROS, Neg liver ROS,   Endo/Other  negative endocrine ROS  Renal/GU Renal disease     Musculoskeletal   Abdominal   Peds  Hematology negative hematology ROS (+)   Anesthesia Other Findings Past Medical History: No date: Gout No date: Hypertension No date: Rupture of bowel ([place])  Past Surgical History: 05/18/2020: COLONOSCOPY WITH PROPOFOL; N/A     Comment:  Procedure: COLONOSCOPY WITH PROPOFOL;  Surgeon: Benjamine Sprague, DO;  Location: [facility] ENDOSCOPY;  Service: General;               Laterality: N/A; 11/22/2019: COLOSTOMY; N/A     Comment:  Procedure: COLOSTOMY;  Surgeon: Herbert Pun,               MD;  Location: [facility] ORS;  Service: General;  Laterality:               N/A; 11/22/2019: LAPAROTOMY; N/A     Comment:  Procedure: EXPLORATORY LAPAROTOMY;  Surgeon:               Herbert Pun, MD;  Location: [facility] ORS;  Service:              General;  Laterality: N/A; 07/08/2020: LAPAROTOMY; N/A     Comment:  Procedure: EXPLORATORY LAPAROTOMY;  Surgeon:               Herbert Pun, MD;  Location: [facility] ORS;  Service:              General;  Laterality: N/A; 11/22/2019: PARTIAL COLECTOMY; N/A     Comment:  Procedure: PARTIAL COLECTOMY;  Surgeon: Herbert Pun, MD;  Location:  [facility] ORS;  Service: General;                Laterality: N/A; 06/28/2020: XI ROBOTIC ASSISTED COLOSTOMY TAKEDOWN; N/A     Comment:  Procedure: XI ROBOTIC ASSISTED COLOSTOMY TAKEDOWN               CONVERTED TO OPEN PROCEDURE;  Surgeon: Herbert Pun, MD;  Location: [facility] ORS;  Service: General;                Laterality: N/A;  BMI    Body Mass Index: 32.16 kg/m      Reproductive/Obstetrics negative OB ROS                            Anesthesia Physical Anesthesia Plan  ASA: IV  Anesthesia Plan: General ETT   Post-op Pain Management:    Induction: Intravenous  PONV Risk Score and Plan: Ondansetron, Dexamethasone, Midazolam and Treatment may vary due  to age or medical condition  Airway Management Planned: Oral ETT  Additional Equipment:   Intra-op Plan:   Post-operative Plan: Post-operative intubation/ventilation  Informed Consent: I have reviewed the patients History and Physical, chart, labs and discussed the procedure including the risks, benefits and alternatives for the proposed anesthesia with the patient or authorized representative who has indicated his/her understanding and acceptance.     Dental Advisory Given  Plan Discussed with: Anesthesiologist, CRNA and Surgeon  Anesthesia Plan Comments: (History and phone consent from the patients wife Donato Schultz  Wife consented for risks of anesthesia including but not limited to:  - adverse reactions to medications - damage to eyes, teeth, lips or other oral mucosa - nerve damage due to positioning  - sore throat or hoarseness - Damage to heart, brain, nerves, lungs, other parts of body or loss of life  She voiced understanding.)       Anesthesia Quick Evaluation

## 2020-09-12 NOTE — Progress Notes (Signed)
Inpatient Diabetes Program Recommendations  AACE/ADA: New Consensus Statement on Inpatient Glycemic Control (2015)  Target Ranges:  Prepandial:   less than 140 mg/dL      Peak postprandial:   less than 180 mg/dL (1-2 hours)      Critically ill patients:  140 - 180 mg/dL   Lab Results  Component Value Date   GLUCAP 229 (H) 09/12/2020   HGBA1C 5.7 (H) 08/10/2020    Review of Glycemic Control Results for REYHAN, MORONTA (MRN [id number]) as of 09/12/2020 12:30  Ref. Range 09/12/2020 00:15 09/12/2020 03:59 09/12/2020 07:38 09/12/2020 12:18  Glucose-Capillary Latest Ref Range: 70 - 99 mg/dL 204 (H) 207 (H) 218 (H) 229 (H)   Current orders for Inpatient glycemic control:  Novolog 2-4-6 q 4 hours  Inpatient Diabetes Program Recommendations:    Note tube feeds resumed. May consider adding Novolog tube feed coverage 3 units q 4 hours while on feeds.    Thanks,  Adah Perl, RN, BC-ADM Inpatient Diabetes Coordinator Pager [phone] (8a-5p)

## 2020-09-13 ENCOUNTER — Encounter
Admission: EM | Disposition: A | Discharge status: Other Acute Inpatient Hospital | Payer: Self-pay | Source: Home / Self Care | Attending: Internal Medicine

## 2020-09-13 ENCOUNTER — Inpatient Hospital Stay: Payer: No Typology Code available for payment source

## 2020-09-13 ENCOUNTER — Inpatient Hospital Stay: Payer: Self-pay

## 2020-09-13 ENCOUNTER — Inpatient Hospital Stay: Payer: No Typology Code available for payment source | Admitting: Anesthesiology

## 2020-09-13 DIAGNOSIS — U071 COVID-19: Secondary | ICD-10-CM | POA: Diagnosis not present

## 2020-09-13 DIAGNOSIS — G9341 Metabolic encephalopathy: Secondary | ICD-10-CM | POA: Diagnosis not present

## 2020-09-13 DIAGNOSIS — J9601 Acute respiratory failure with hypoxia: Secondary | ICD-10-CM | POA: Diagnosis not present

## 2020-09-13 DIAGNOSIS — R7881 Bacteremia: Secondary | ICD-10-CM | POA: Diagnosis not present

## 2020-09-13 DIAGNOSIS — B9561 Methicillin susceptible Staphylococcus aureus infection as the cause of diseases classified elsewhere: Secondary | ICD-10-CM | POA: Diagnosis not present

## 2020-09-13 HISTORY — PX: TRACHEOSTOMY TUBE PLACEMENT: SHX814

## 2020-09-13 LAB — GLUCOSE, CAPILLARY
Glucose-Capillary: 137 mg/dL — ABNORMAL HIGH (ref 70–99)
Glucose-Capillary: 162 mg/dL — ABNORMAL HIGH (ref 70–99)
Glucose-Capillary: 163 mg/dL — ABNORMAL HIGH (ref 70–99)
Glucose-Capillary: 250 mg/dL — ABNORMAL HIGH (ref 70–99)
Glucose-Capillary: 251 mg/dL — ABNORMAL HIGH (ref 70–99)
Glucose-Capillary: 295 mg/dL — ABNORMAL HIGH (ref 70–99)

## 2020-09-13 LAB — RENAL FUNCTION PANEL
Albumin: 2 g/dL — ABNORMAL LOW (ref 3.5–5.0)
Anion gap: 11 (ref 5–15)
BUN: <5 mg/dL — ABNORMAL LOW (ref 6–20)
CO2: 28 mmol/L (ref 22–32)
Calcium: 7.8 mg/dL — ABNORMAL LOW (ref 8.9–10.3)
Chloride: 104 mmol/L (ref 98–111)
Creatinine, Ser: <0.3 mg/dL — ABNORMAL LOW (ref 0.61–1.24)
Glucose, Bld: 153 mg/dL — ABNORMAL HIGH (ref 70–99)
Phosphorus: 3.5 mg/dL (ref 2.5–4.6)
Potassium: 3.1 mmol/L — ABNORMAL LOW (ref 3.5–5.1)
Sodium: 143 mmol/L (ref 135–145)

## 2020-09-13 LAB — MAGNESIUM: Magnesium: 1.6 mg/dL — ABNORMAL LOW (ref 1.7–2.4)

## 2020-09-13 LAB — TRIGLYCERIDES: Triglycerides: 361 mg/dL — ABNORMAL HIGH (ref <150)

## 2020-09-13 IMAGING — CT CT ABDOMEN W/O CM
2 of 7 series · 14 of 46 positions shown, 16 images · non-contrast
Comparison: Chest radiograph-[DATE]

CLINICAL DATA: Evaluate anatomy prior to potential percutaneous
gastrostomy tube placement.

EXAM:
CT ABDOMEN WITHOUT CONTRAST
TECHNIQUE: Multidetector CT imaging of the abdomen was performed following the
standard protocol without IV contrast.

[Series 2: routine abd/pel wo · axial · 0.83mm/px · z∈[-796,-516]mm · 11 of 64 slices shown, 13 images]
[im 4/64  soft-tissue]
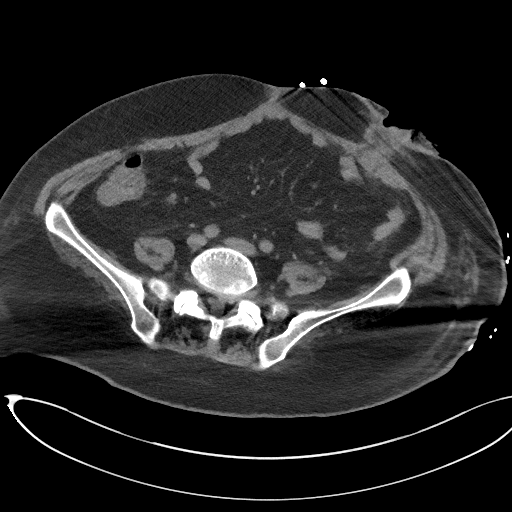
[im 4/64  bone]
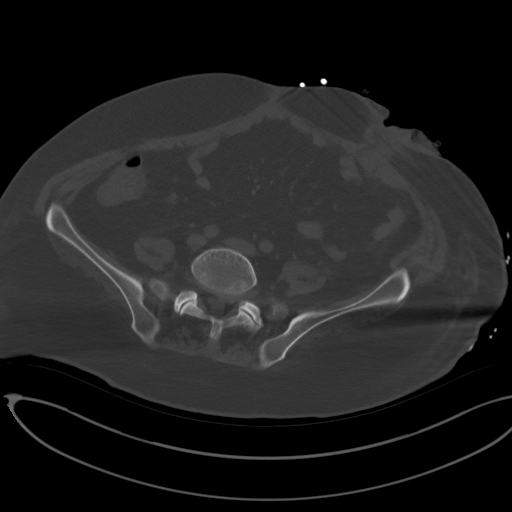
[im 10/64  soft-tissue]
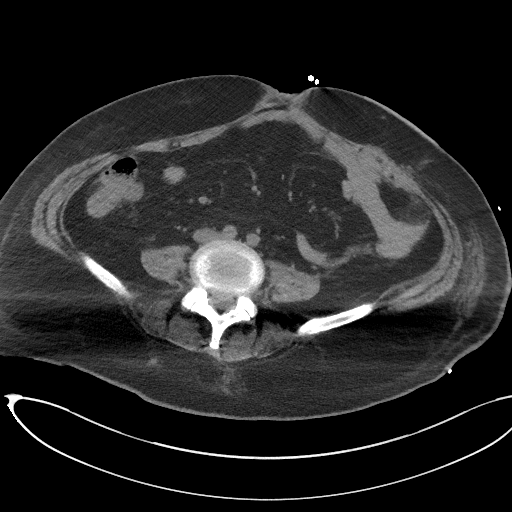
[im 16/64  soft-tissue]
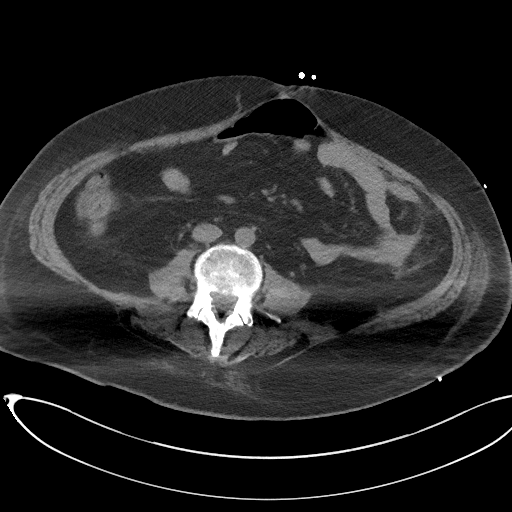
[im 23/64  soft-tissue]
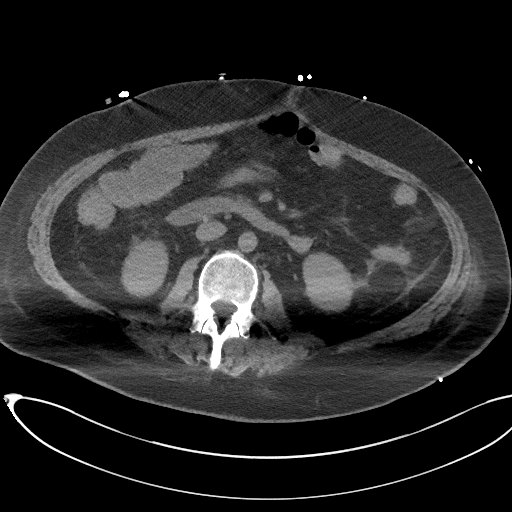
[im 26/64  soft-tissue]
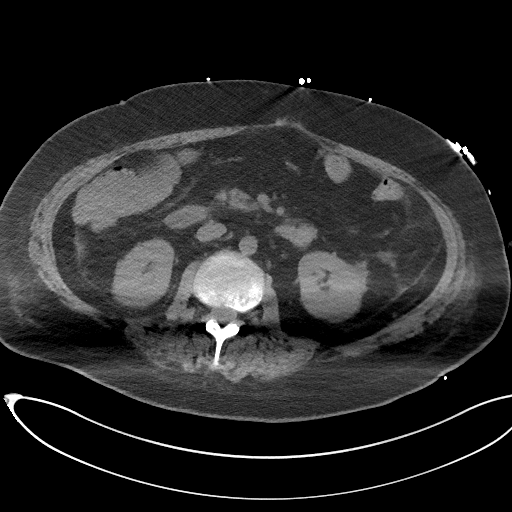
[im 32/64  soft-tissue]
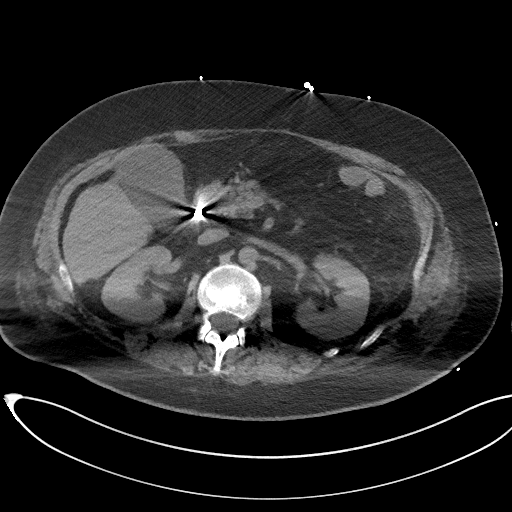
[im 38/64  soft-tissue]
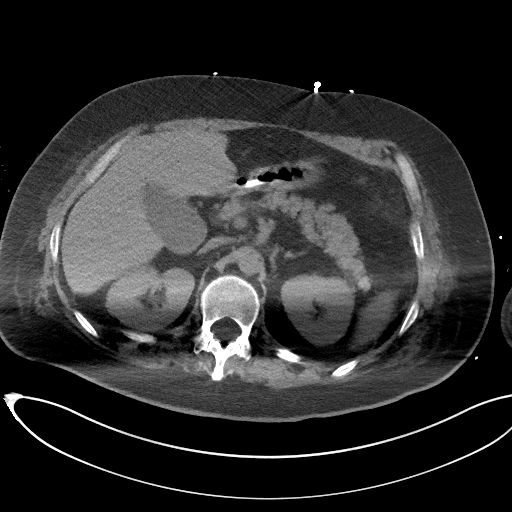
[im 41/64  soft-tissue]
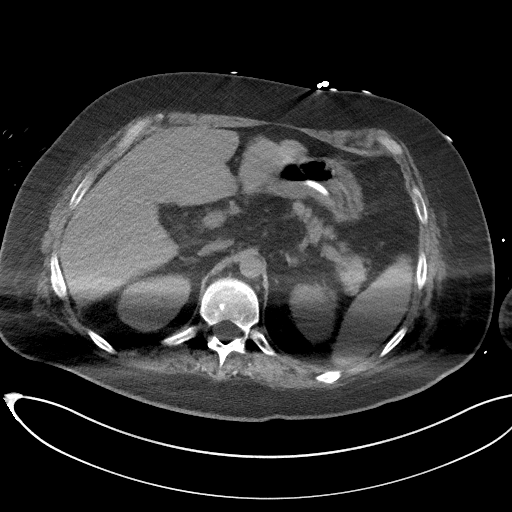
[im 48/64  soft-tissue]
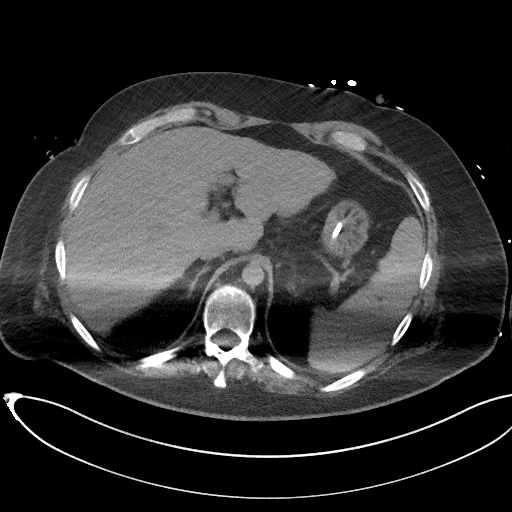
[im 48/64  bone]
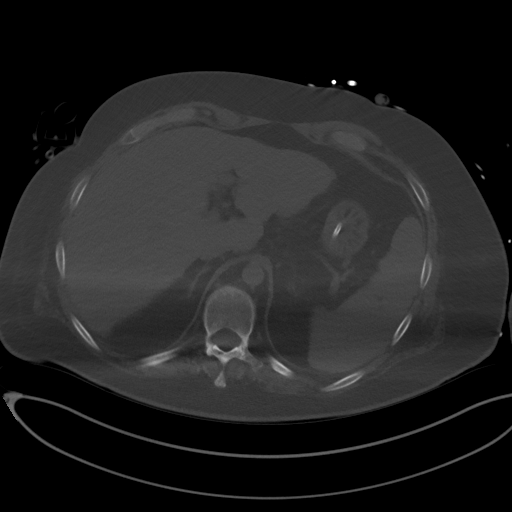
[im 54/64  soft-tissue]
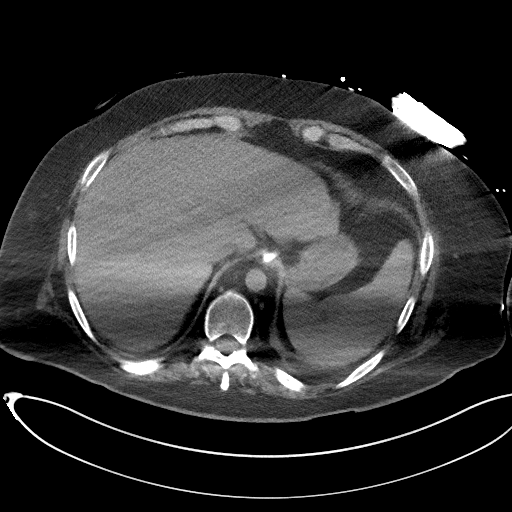
[im 60/64  soft-tissue]
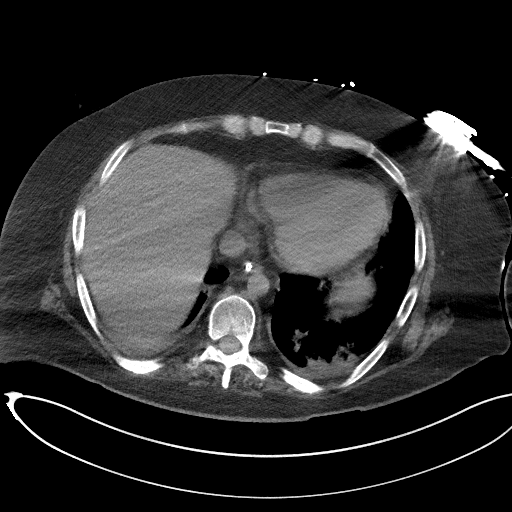

[Series 6: coronal st · coronal · 0.63mm/px · 3 of 99 slices shown]
[im 25/99  soft-tissue]
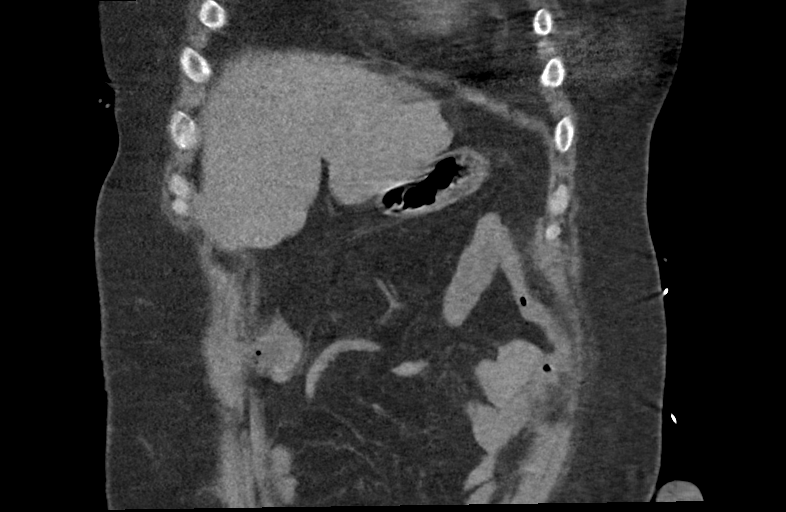
[im 50/99  soft-tissue]
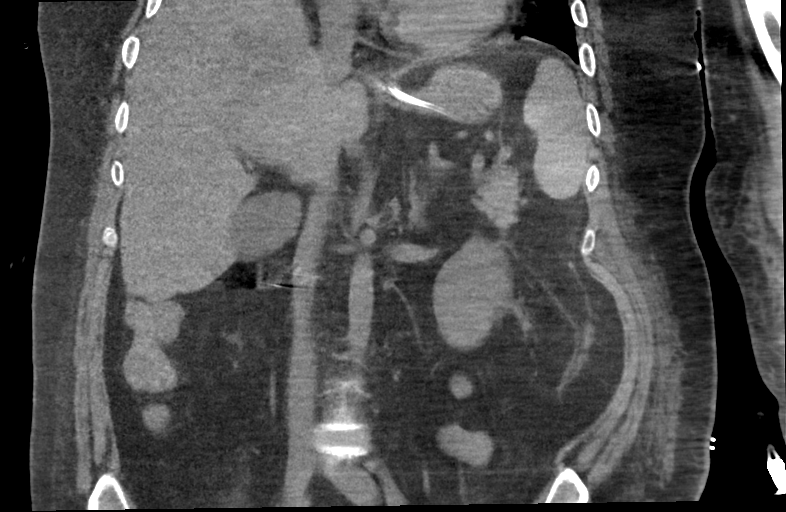
[im 74/99  soft-tissue]
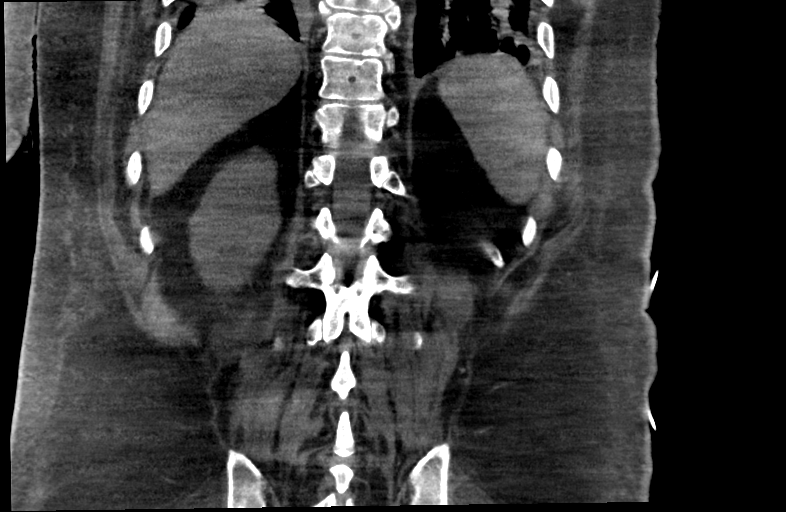

[14 of 46 positions shown; findings below may reference images not displayed]

FINDINGS: The lack of intravenous contrast limits the ability to evaluate
solid abdominal organs.

Lower chest: Limited visualization of the lower thorax demonstrates
a trace left-sided pleural effusion. Nodular interstitial and
airspace opacities are seen within the imaged portions of the
bilateral lung bases.

Hepatobiliary: Nodularity of the hepatic contour (image 20, series
2). The gallbladder is distended but without definitive evidence of
gallbladder wall thickening or pericholecystic fluid on this
noncontrast examination. No radiopaque gallstones. No ascites.

Pancreas: Normal noncontrast appearance of the pancreas.

Spleen: Normal noncontrast appearance spleen.

Adrenals/Urinary Tract: Normal noncontrast appearance of the
bilateral kidneys. No renal stones. No urine obstruction or
perinephric stranding.

Normal noncontrast appearance the bilateral adrenal glands.

The urinary bladder was not imaged.

Stomach/Bowel: The anterior wall the stomach is well apposed against
the ventral wall of the upper abdomen without interposition either
the hepatic parenchyma or the transverse colon and the percutaneous
window will likely be improved with gastric insufflation.

Note is made of an end colostomy within the ventral aspect the left
mid hemiabdomen. Enteric tube terminates within the proximal
descending duodenum. No evidence of enteric obstruction. No
pneumoperitoneum, pneumatosis or portal venous gas.

Vascular/Lymphatic: Normal caliber of the abdominal aorta.

No bulky retroperitoneal or mesenteric lymphadenopathy.

Other: Diastasis of the midline abdominal musculature with
eventration but without definitive hernia. Mild diffuse body wall
anasarca.

Musculoskeletal: No acute or aggressive osseous.
IMPRESSION: 1. Gastric anatomy amenable to potential percutaneous gastrostomy
tube placement as indicated.
2. End colostomy within ventral aspect of the left mid abdomen
without evidence of enteric obstruction.
3. Trace right-sided pleural effusion with extensive nodular
airspace opacities within the imaged bilateral lung bases compatible
provided history of COVID 19 pneumonia.
4. Nodularity hepatic contour as could be seen in the setting of
cirrhotic change.

## 2020-09-13 SURGERY — CREATION, TRACHEOSTOMY
Anesthesia: General

## 2020-09-13 MED ORDER — MIDAZOLAM 50MG/50ML (1MG/ML) PREMIX INFUSION
0.5000 mg/h | INTRAVENOUS | Status: DC
  Administered 2020-09-13: 0.5 mg/h via INTRAVENOUS
  Administered 2020-09-14: 3 mg/h via INTRAVENOUS
  Administered 2020-09-14: 1.5 mg/h via INTRAVENOUS
  Administered 2020-09-15: 2 mg/h via INTRAVENOUS
  Filled 2020-09-13 (×4): qty 50

## 2020-09-13 MED ORDER — PHENYLEPHRINE HCL (PRESSORS) 10 MG/ML IV SOLN
INTRAVENOUS | Status: DC | PRN
  Administered 2020-09-13: 100 ug via INTRAVENOUS

## 2020-09-13 MED ORDER — CLOTRIMAZOLE-BETAMETHASONE 1-0.05 % EX CREA
TOPICAL_CREAM | Freq: Two times a day (BID) | CUTANEOUS | Status: DC
  Administered 2020-09-16 – 2020-09-17 (×2): 1 via TOPICAL
  Filled 2020-09-13: qty 15

## 2020-09-13 MED ORDER — NOREPINEPHRINE 4 MG/250ML-% IV SOLN
0.0000 ug/min | INTRAVENOUS | Status: DC
  Administered 2020-09-13: 10 ug/min via INTRAVENOUS
  Administered 2020-09-14: 12 ug/min via INTRAVENOUS
  Administered 2020-09-14: 8 ug/min via INTRAVENOUS
  Filled 2020-09-13 (×3): qty 250

## 2020-09-13 MED ORDER — CEFAZOLIN SODIUM-DEXTROSE 2-4 GM/100ML-% IV SOLN
2.0000 g | Freq: Three times a day (TID) | INTRAVENOUS | Status: DC
  Administered 2020-09-13 – 2020-09-19 (×19): 2 g via INTRAVENOUS
  Filled 2020-09-13 (×24): qty 100

## 2020-09-13 MED ORDER — SODIUM CHLORIDE 0.9% FLUSH: 10.0000 mL | INTRAVENOUS | Status: DC | PRN

## 2020-09-13 MED ORDER — MAGNESIUM SULFATE 2 GM/50ML IV SOLN
2.0000 g | Freq: Once | INTRAVENOUS | Status: AC
  Administered 2020-09-13: 2 g via INTRAVENOUS
  Filled 2020-09-13: qty 50

## 2020-09-13 MED ORDER — SODIUM CHLORIDE 0.9% FLUSH
10.0000 mL | Freq: Two times a day (BID) | INTRAVENOUS | Status: DC
  Administered 2020-09-13: 10 mL
  Administered 2020-09-13: 20 mL
  Administered 2020-09-14: 30 mL
  Administered 2020-09-14: 10 mL
  Administered 2020-09-15: 30 mL
  Administered 2020-09-15 – 2020-09-16 (×3): 10 mL
  Administered 2020-09-17: 20 mL
  Administered 2020-09-17 – 2020-09-24 (×13): 10 mL

## 2020-09-13 MED ORDER — SODIUM CHLORIDE 0.9 % IV SOLN: INTRAVENOUS | Status: DC | PRN

## 2020-09-13 MED ORDER — LIDOCAINE-EPINEPHRINE 1 %-1:100000 IJ SOLN
INTRAMUSCULAR | Status: DC | PRN
  Administered 2020-09-13: 4 mL

## 2020-09-13 MED ORDER — PROPOFOL 10 MG/ML IV BOLUS
INTRAVENOUS | Status: AC
  Filled 2020-09-13: qty 20

## 2020-09-13 MED ORDER — MIDAZOLAM HCL 2 MG/2ML IJ SOLN
INTRAMUSCULAR | Status: AC
  Filled 2020-09-13: qty 2

## 2020-09-13 MED ORDER — POTASSIUM CHLORIDE 20 MEQ PO PACK
40.0000 meq | PACK | ORAL | Status: AC
  Administered 2020-09-13 (×2): 40 meq
  Filled 2020-09-13 (×2): qty 2

## 2020-09-13 MED ORDER — INSULIN ASPART 100 UNIT/ML ~~LOC~~ SOLN
3.0000 [IU] | SUBCUTANEOUS | Status: DC
  Administered 2020-09-13 – 2020-09-23 (×51): 3 [IU] via SUBCUTANEOUS
  Filled 2020-09-13 (×44): qty 1

## 2020-09-13 MED ORDER — INSULIN ASPART 100 UNIT/ML ~~LOC~~ SOLN
0.0000 [IU] | SUBCUTANEOUS | Status: DC
  Administered 2020-09-14: 3 [IU] via SUBCUTANEOUS
  Administered 2020-09-14: 8 [IU] via SUBCUTANEOUS
  Administered 2020-09-14: 3 [IU] via SUBCUTANEOUS
  Administered 2020-09-14: 5 [IU] via SUBCUTANEOUS
  Administered 2020-09-14: 3 [IU] via SUBCUTANEOUS
  Administered 2020-09-14: 2 [IU] via SUBCUTANEOUS
  Administered 2020-09-15: 3 [IU] via SUBCUTANEOUS
  Administered 2020-09-15: 2 [IU] via SUBCUTANEOUS
  Administered 2020-09-15: 5 [IU] via SUBCUTANEOUS
  Administered 2020-09-15 (×2): 2 [IU] via SUBCUTANEOUS
  Administered 2020-09-15 – 2020-09-16 (×2): 3 [IU] via SUBCUTANEOUS
  Administered 2020-09-16: 5 [IU] via SUBCUTANEOUS
  Administered 2020-09-16: 2 [IU] via SUBCUTANEOUS
  Administered 2020-09-16: 5 [IU] via SUBCUTANEOUS
  Administered 2020-09-16 (×2): 3 [IU] via SUBCUTANEOUS
  Administered 2020-09-16: 5 [IU] via SUBCUTANEOUS
  Administered 2020-09-17: 3 [IU] via SUBCUTANEOUS
  Administered 2020-09-17 (×3): 5 [IU] via SUBCUTANEOUS
  Administered 2020-09-17: 3 [IU] via SUBCUTANEOUS
  Administered 2020-09-17 – 2020-09-18 (×5): 5 [IU] via SUBCUTANEOUS
  Administered 2020-09-18 – 2020-09-19 (×6): 3 [IU] via SUBCUTANEOUS
  Administered 2020-09-19: 5 [IU] via SUBCUTANEOUS
  Administered 2020-09-20: 2 [IU] via SUBCUTANEOUS
  Administered 2020-09-20 (×3): 3 [IU] via SUBCUTANEOUS
  Administered 2020-09-21 – 2020-09-22 (×3): 2 [IU] via SUBCUTANEOUS
  Administered 2020-09-22 – 2020-09-23 (×5): 3 [IU] via SUBCUTANEOUS
  Filled 2020-09-13 (×46): qty 1

## 2020-09-13 MED ORDER — ROCURONIUM BROMIDE 100 MG/10ML IV SOLN
INTRAVENOUS | Status: DC | PRN
  Administered 2020-09-13: 50 mg via INTRAVENOUS

## 2020-09-13 MED ORDER — MIDAZOLAM HCL 2 MG/2ML IJ SOLN
INTRAMUSCULAR | Status: DC | PRN
  Administered 2020-09-13: 2 mg via INTRAVENOUS

## 2020-09-13 MED ORDER — PROPOFOL 500 MG/50ML IV EMUL
INTRAVENOUS | Status: AC
  Filled 2020-09-13: qty 50

## 2020-09-13 MED ORDER — ROCURONIUM BROMIDE 10 MG/ML (PF) SYRINGE
PREFILLED_SYRINGE | INTRAVENOUS | Status: AC
  Filled 2020-09-13: qty 10

## 2020-09-13 SURGICAL SUPPLY — 38 items
BLADE SURG 15 STRL LF DISP TIS (BLADE) ×1 IMPLANT
BLADE SURG 15 STRL SS (BLADE) ×2
BLADE SURG SZ11 CARB STEEL (BLADE) ×2 IMPLANT
CANISTER SUCT 1200ML W/VALVE (MISCELLANEOUS) ×2 IMPLANT
CORD BIP STRL DISP 12FT (MISCELLANEOUS) IMPLANT
COVER WAND RF STERILE (DRAPES) ×2 IMPLANT
ELECT CAUTERY BLADE TIP 2.5 (TIP) ×2
ELECT REM PT RETURN 9FT ADLT (ELECTROSURGICAL) ×2
ELECTRODE CAUTERY BLDE TIP 2.5 (TIP) ×1 IMPLANT
ELECTRODE REM PT RTRN 9FT ADLT (ELECTROSURGICAL) ×1 IMPLANT
FORCEPS JEWEL BIP 4-3/4 STR (INSTRUMENTS) IMPLANT
GAUZE PACKING IODOFORM 1/2 (PACKING) IMPLANT
GLOVE BIO SURGEON STRL SZ7.5 (GLOVE) ×2 IMPLANT
GOWN STRL REUS W/ TWL LRG LVL3 (GOWN DISPOSABLE) ×3 IMPLANT
GOWN STRL REUS W/TWL LRG LVL3 (GOWN DISPOSABLE) ×6
HLDR TRACH TUBE NECKBAND 18 (MISCELLANEOUS) ×1 IMPLANT
HOLDER TRACH TUBE NECKBAND 18 (MISCELLANEOUS) ×1
LABEL OR SOLS (LABEL) ×2 IMPLANT
MANIFOLD NEPTUNE II (INSTRUMENTS) IMPLANT
NS IRRIG 500ML POUR BTL (IV SOLUTION) ×2 IMPLANT
PACK HEAD/NECK (MISCELLANEOUS) ×2 IMPLANT
SHEARS HARMONIC 9CM CVD (BLADE) ×2 IMPLANT
SPONGE DRAIN TRACH 4X4 STRL 2S (GAUZE/BANDAGES/DRESSINGS) ×2 IMPLANT
SPONGE KITTNER 5P (MISCELLANEOUS) ×2 IMPLANT
SPONGE VERSALON 4X4 4PLY (MISCELLANEOUS) ×2 IMPLANT
SUCTION FRAZIER HANDLE 10FR (MISCELLANEOUS) ×1
SUCTION TUBE FRAZIER 10FR DISP (MISCELLANEOUS) ×1 IMPLANT
SUT ETHILON 2 0 FS 18 (SUTURE) ×2 IMPLANT
SUT SILK 2 0 (SUTURE)
SUT SILK 2 0 SH (SUTURE) ×2 IMPLANT
SUT SILK 2-0 18XBRD TIE 12 (SUTURE) IMPLANT
SUT VIC AB 3-0 PS2 18 (SUTURE) ×2 IMPLANT
SYR 3ML LL SCALE MARK (SYRINGE) ×2 IMPLANT
TUBE TRACH  6.0 CUFF FLEX (MISCELLANEOUS)
TUBE TRACH 6.0 CUFF FLEX (MISCELLANEOUS) IMPLANT
TUBE TRACH FLEX 8.0 CUFF (MISCELLANEOUS) ×2
TUBE TRACH FLEX 8.5 CUFF (MISCELLANEOUS) ×1 IMPLANT
TUBE TRACH SHILEY 8 DIST CUF (TUBING) IMPLANT

## 2020-09-13 NOTE — Progress Notes (Signed)
[facility] for Electrolyte Monitoring and Replacement   Recent Labs: Potassium (mmol/L)  Date Value  09/13/2020 3.1 (L)   Magnesium (mg/dL)  Date Value  09/13/2020 1.6 (L)   Calcium (mg/dL)  Date Value  09/13/2020 7.8 (L)   Albumin (g/dL)  Date Value  09/13/2020 2.0 (L)   Phosphorus (mg/dL)  Date Value  09/13/2020 3.5   Sodium (mmol/L)  Date Value  09/13/2020 143   Corrected Ca: 8.9 mg/dL  Assessment: 30 yo male admitted with perforated diverticulitis and severe acute hypoxemic respiratory failure secondary to COVID-19 infection. Patient has severe ARDs and was reintubated 1/21. Pharmacy has been consulted for electrolyte monitoring and replacement.   Goal of Therapy:  Electrolytes WNL   Plan:  1/25 AM labs: Na 143, K 3.1, Phos 3.5, Mg 1.6  KCl 40 mEq x 2  Magnesium sulfate 2 g IV x 1  Recheck electrolytes in AM  Pharmacy will continue to follow and replace electrolytes as needed.   Benita Gutter 09/13/2020 8:46 AM

## 2020-09-13 NOTE — Op Note (Signed)
09/13/2020 8:13 AM  Harlin Heys [id number]  Pre-Op Dx: Respiratory failure Post-Op Dx:  Same  Proc:  Tracheostomy  Surg:  [name]   Asst: Vaught  Anes:  GOT  EBL: Less than 10 cc  Comp: None  Findings: Normal anterior neck anatomy  Procedure:  The patient was brought from the intensive care unit to the operating room and transferred to an operating table.  Anesthesia was administered per indwelling orotracheal tube.   Neck extension was achieved as possible anda shoulder rolke was placed.  The lower neck was palpated with the findings as described above.  1% Xylocaine with 1:100,000 epinephrine, four cc's, was infiltrated into the surgical field for intraoperative hemostasis.  Several minutes were allowed for this to take effect. The patient was prepped in a sterile fashion with a surgical prep from the chin down to the upper chest.  Sterile draping was accomplished in the standard fashion.  A four cm horizontal incision was made sharply a finger's breadth above the sternal notch, and extended through skin and subcutaneous fat.  Using cautery, the superficial layer of the deep cervical fascia was lysed.  Additional dissection revealed the strap muscles.  The midline raphe was divided in two layers and the muscles retracted laterally.  The pretracheal plane was visualized.  This was entered bluntly.  The thyroid isthmus was isolated and divided with the Harmonic scalpel.  The thyroid gland was retracted to either side.  The anterior face of the trachea was cleared.  In the third interspace, a transverse incision was made between cartilage rings into the tracheal lumen.  A 6 mm wide inferiorly based flap was generated and secured to the lower wound with a 4-0 chromic suture.   A previously tested  #8 Shiley cuffed tracheostomy tube was brought into the field.  With the endotracheal tube under direct visualization through the tracheostomy, it was gently backed up.  The  tracheostomy tube was inserted into the tracheal lumen.  Hemostasis was observed. The cuff was inflated and observed to be intact and containing pressure. The inner cannula was placed and ventilation assumed per tracheostomy tube.  Good chest wall motion was observed, and CO2 was documented per anesthesia.  The trach tube was secured in the standard fashion with trach ties. A 2-0 silk suture was used to secure the trach tube to the skin on both sides.  Hemostasis was observed again.  When satisfactory ventilation was assured, the orotracheal tube was removed.  At this point the procedure was completed.  The patient was returned to anesthesia, awakened as possible, and transferred back to the intensive care unit in stable condition.  Comment: 30 y.o. male with prolonged ventilation was the indication for today's procedure.  Anticipate a routine postoperative recovery including standard tracheal hygiene.  The sutures should be removed in 5 days.  When the patient no longer requires ventilator or pressure support, the cuff should be deflated.  Changing to an uncuffed tube and downsizing will be according to the clinical condition of the patient.   [name]  8:13 AM 09/13/2020

## 2020-09-13 NOTE — Progress Notes (Signed)
ID  Patient has tracheostomy which was placed today , on the vent Sedated Bilateral erythematous patch on the cheeks Right PICC line BP (!) 114/52   Pulse (!) 121   Temp 99.6 F (37.6 C) (Oral)   Resp (!) 24   Ht 6' 0.01" (1.829 m)   Wt 107.6 kg   SpO2 98%   BMI 32.16 kg/m    Edema of the extremities Chest bilateral air entry crypts in the base Heart sounds S1-S2 Abdomen soft Laparotomy scar Colostomy bag Foley catheter  Labs CBC Latest Ref Rng & Units 09/12/2020 09/11/2020 09/10/2020  WBC 4.0 - 10.5 K/uL 6.8 8.9 9.9  Hemoglobin 13.0 - 17.0 g/dL 9.2(L) 10.3(L) 10.1(L)  Hematocrit 39.0 - 52.0 % 29.4(L) 32.8(L) 31.9(L)  Platelets 150 - 400 K/uL 141(L) 158 126(L)    CMP Latest Ref Rng & Units 09/13/2020 09/12/2020 09/11/2020  Glucose 70 - 99 mg/dL 153(H) 231(H) 174(H)  BUN 6 - 20 mg/dL <5(L) <5(L) <5(L)  Creatinine 0.61 - 1.24 mg/dL <0.30(L) <0.30(L) <0.30(L)  Sodium 135 - 145 mmol/L 143 146(H) 142  Potassium 3.5 - 5.1 mmol/L 3.1(L) 3.1(L) 3.0(L)  Chloride 98 - 111 mmol/L 104 108 108  CO2 22 - 32 mmol/L 28 25 25   Calcium 8.9 - 10.3 mg/dL 7.8(L) 7.9(L) 7.7(L)  Total Protein 6.5 - 8.1 g/dL - - -  Total Bilirubin 0.3 - 1.2 mg/dL - - -  Alkaline Phos 38 - 126 U/L - - -  AST 15 - 41 U/L - - -  ALT 0 - 44 U/L - - -    Impression/recommendation  MSSA bacteremia and MSSA multifocal pneumonia Repeat blood cultures negative 2D echo was a poor study Discussed with Dr. Ubaldo Glassing regarding TEE as concern for endocarditis especially when he had a PICC line before.  Patient currently on cefazolin will continue until TEE is done and we can decide on the duration of antibiotics after that.  Acute hypoxic respiratory failure due to Covid was intubated from 12/25 until 09/08/2020.  He was extubated but had to be reintubated on 09/09/2020.  Had tracheostomy on 09/13/2020.  Nutrition PEG is being considered for tomorrow  Complicated diverticular history Perforated diverticulitis in April  2021 leading to urgent colectomy and ostomy.  Takedown of ostomy November [id number] complicated by dehiscence needing partial colectomy and colostomy on 07/08/2020.  Anemia  Hypoalbuminemia  Discussed the management with the care team.

## 2020-09-13 NOTE — Progress Notes (Signed)
CRITICAL CARE NOTE  30 yo male recent hospitalization for perforated diverticulitis s/p colostomy with reversal and leakage of anastomosis with s/p revision now acutely hypoxemic with COVID19 induced severe ARDS.   INTERVAL EVENTS: 08/11/20-patient continued to decline with worsening respiratory status despite 100%Fio2 on BIPAP and HFNC failure. He stated that he feels he is dying and cannot breathe. I discussed with wife [name] earlier today that patient is critically ill may need ETT, she is agreeable and thankful for care. I specifically discussed and explained intubation and mechanical ventilation to patient and he wishes to proceed.  08/12/20- patient was weaned on FiO2 to 60%. Spoke to wife [name].  08/13/20- Patient weaned to 40%. During SBT today he self extubated and was placed on HFNC. He subsequently became hypoxemic, disoriented aggitated, removed PIVs and colostomy and proceeded to have combative behavior with worsening oxygenation. I have attempted to calm patient and encouraged him to cooperate. After numerous attempts patient continued to be combative with severe aggitation with combative behavior and would not wear oxygen. Patient was placed on sedation and MV. Wife updated.  08/14/20-patient remains critically ill. Weaned from 100%>>55%. Called and updated wife [name] today 12/27-Patient with worsening oxygenation on FiO2 100% peep of 14 08/16/20-Patient with continued high oxygen requirements overnight. Have spoken with surgery and there should be no problem proning patient with this. Was febrile yesterday and thus broad spectrum abx started and cultures attained.  08-17-20-patient down to PEEP 10 FiO2 55%. Patient never required proning 08/18/20:FiO2 weaned to 40%, PEEP 10, plan for diuresis with Diamox today, adding PO Clonazepam to assist weaning sedation 08/19/20: Vent changed to Pressure Control this morning: 50% FiO2, 24, 22/5, Plan for Recruitment maneuvers  and diuresis 08/20/2020:Continued issues with hypercapnia due to the dead space ventilation. Worsening despite pressure control. Switch back to [facility], prone positioning 08/23/19- patient proned today without incident, continue to wean FiO2 , currently on 50% 08/24/19-patient weaned to 40% FiO2, plan to continue proning with full scope of care. I have spoken to [name] (wife of patient today) she would like to be present prior to next extubation attempt to help console patient and decrease his aggitation. 1/41filed weaning trials, ver agitated , severe hypoxia, Wife at bedside witnessed failed SAT. 1/6 severe resp failure, hypoxia 1/7 proning for 16 hrs, severe ARDS 1/8 PICC line placed, Had a brief PEA arrest while prone. Proningdiscontinued 1/10severe resp failure 1/11severe ARDS, [facility] resp failure 09/02/20-patient with no acute events. Spoke with TJonelle Sidletoday answered questions and plan is for trache.  09/03/20- patient remains on MV. FiO2 down to 35%PRVC plan for trache. 09/04/20- patient with TG>1000 will switch off propofol but patient with very high sedation requirement, will discuss options with pharmD. Plan for trache. Met with wife [name] at bedside.  1/20 extubated, wife at bedside, very weak cough, encephalopathy 1/21 re-intubated, severe resp failure [facility] needs [facility], severe resp failure    CC  follow up respiratory failure  SUBJECTIVE Patient remains critically ill Prognosis is guarded   BP (!) 113/40   Pulse (!) 102   Temp 99.86 F (37.7 C)   Resp (!) 24   Ht 6' 0.01" (1.829 m)   Wt 107.6 kg   SpO2 100%   BMI 32.16 kg/m    I/O last 3 completed shifts: In: 9148.2 [I.V.:5697.7; NG/GT:2590.7; IV Piggyback:859.8] Out: 44656[Urine:3425; Stool:900] No intake/output data recorded.  SpO2: 100 % O2 Flow Rate (L/min): 40 L/min FiO2 (%): 35 %  Estimated body mass index is 32.16 kg/m as  calculated from the following:   Height as of this encounter: 6' 0.01" (1.829 m).   Weight as of this encounter: 107.6 kg.  SIGNIFICANT EVENTS   REVIEW OF SYSTEMS  PATIENT IS UNABLE TO PROVIDE COMPLETE REVIEW OF SYSTEMS DUE TO SEVERE CRITICAL ILLNESS        PHYSICAL EXAMINATION:  GENERAL:critically ill appearing, +resp distress HEAD: Normocephalic, atraumatic.  EYES: Pupils equal, round, reactive to light.  No scleral icterus.  MOUTH: Moist mucosal membrane. NECK: Supple.  PULMONARY: +rhonchi, +wheezing CARDIOVASCULAR: S1 and S2. Regular rate and rhythm. No murmurs, rubs, or gallops.  GASTROINTESTINAL: Soft, nontender, -distended.  Positive bowel sounds.   MUSCULOSKELETAL: No swelling, clubbing, or edema.  NEUROLOGIC: obtunded, GCS<8 SKIN:intact,warm,dry  MEDICATIONS: I have reviewed all medications and confirmed regimen as documented   CULTURE RESULTS   Recent Results (from the past 240 hour(s))  Culture, respiratory     Status: None   Collection Time: 09/06/20  6:27 AM   Specimen: Tracheal Aspirate; Respiratory  Result Value Ref Range Status   Specimen Description   Final    TRACHEAL ASPIRATE Performed at [facility], [address] [place], [place] [address]    Special Requests   Final    NONE Performed at [facility], [address] [place], [place] [address]    Gram Stain   Final    MODERATE WBC PRESENT,BOTH PMN AND MONONUCLEAR ABUNDANT GRAM POSITIVE COCCI IN PAIRS IN CLUSTERS Performed at [facility], [place] [address], [place], [place] [address]    Culture MODERATE STAPHYLOCOCCUS AUREUS  Final   Report Status 09/08/2020 FINAL  Final   Organism ID, Bacteria STAPHYLOCOCCUS AUREUS  Final      Susceptibility   Staphylococcus aureus - MIC*    CIPROFLOXACIN <=0.5 SENSITIVE Sensitive     ERYTHROMYCIN >=8 RESISTANT Resistant     GENTAMICIN <=0.5 SENSITIVE Sensitive     OXACILLIN <=0.25 SENSITIVE Sensitive     TETRACYCLINE <=1  SENSITIVE Sensitive     VANCOMYCIN 1 SENSITIVE Sensitive     TRIMETH/SULFA <=10 SENSITIVE Sensitive     CLINDAMYCIN RESISTANT Resistant     RIFAMPIN <=0.5 SENSITIVE Sensitive     Inducible Clindamycin POSITIVE Resistant     * MODERATE STAPHYLOCOCCUS AUREUS  CULTURE, BLOOD (ROUTINE X 2) w Reflex to ID Panel     Status: Abnormal   Collection Time: 09/06/20  9:35 AM   Specimen: BLOOD  Result Value Ref Range Status   Specimen Description   Final    BLOOD LINE Performed at [facility], [address], [place], [place] [address]    Special Requests   Final    BOTTLES DRAWN AEROBIC AND ANAEROBIC Blood Culture adequate volume Performed at [facility], [address] [place], [place] [address]    Culture  Setup Time   Final    GRAM POSITIVE COCCI IN BOTH AEROBIC AND ANAEROBIC BOTTLES CRITICAL RESULT CALLED TO, READ BACK BY AND VERIFIED WITH: JASON ROBINS _0  ON 09/16/20 SKL Performed at [facility], [place] [address], [place], [place] [address]    Culture STAPHYLOCOCCUS AUREUS (A)  Final   Report Status 09/09/2020 FINAL  Final   Organism ID, Bacteria STAPHYLOCOCCUS AUREUS  Final      Susceptibility   Staphylococcus aureus - MIC*    CIPROFLOXACIN <=0.5 SENSITIVE Sensitive     ERYTHROMYCIN RESISTANT Resistant     GENTAMICIN <=0.5 SENSITIVE Sensitive  OXACILLIN 0.5 SENSITIVE Sensitive     TETRACYCLINE <=1 SENSITIVE Sensitive     VANCOMYCIN 1 SENSITIVE Sensitive     TRIMETH/SULFA <=10 SENSITIVE Sensitive     CLINDAMYCIN RESISTANT Resistant     RIFAMPIN <=0.5 SENSITIVE Sensitive     Inducible Clindamycin POSITIVE Resistant     * STAPHYLOCOCCUS AUREUS  Blood Culture ID Panel (Reflexed)     Status: Abnormal   Collection Time: 09/06/20  9:35 AM  Result Value Ref Range Status   Enterococcus faecalis NOT DETECTED NOT DETECTED Final   Enterococcus Faecium NOT DETECTED NOT DETECTED Final   Listeria monocytogenes NOT DETECTED NOT DETECTED Final    Staphylococcus species DETECTED (A) NOT DETECTED Final    Comment: CRITICAL RESULT CALLED TO, READ BACK BY AND VERIFIED WITH: JASON ROBINS _0  ON 09/06/20 SKL    Staphylococcus aureus (BCID) DETECTED (A) NOT DETECTED Final    Comment: CRITICAL RESULT CALLED TO, READ BACK BY AND VERIFIED WITH: JASON ROBINS _1  ON 09/06/20 SKL    Staphylococcus epidermidis NOT DETECTED NOT DETECTED Final   Staphylococcus lugdunensis NOT DETECTED NOT DETECTED Final   Streptococcus species NOT DETECTED NOT DETECTED Final   Streptococcus agalactiae NOT DETECTED NOT DETECTED Final   Streptococcus pneumoniae NOT DETECTED NOT DETECTED Final   Streptococcus pyogenes NOT DETECTED NOT DETECTED Final   A.calcoaceticus-baumannii NOT DETECTED NOT DETECTED Final   Bacteroides fragilis NOT DETECTED NOT DETECTED Final   Enterobacterales NOT DETECTED NOT DETECTED Final   Enterobacter cloacae complex NOT DETECTED NOT DETECTED Final   Escherichia coli NOT DETECTED NOT DETECTED Final   Klebsiella aerogenes NOT DETECTED NOT DETECTED Final   Klebsiella oxytoca NOT DETECTED NOT DETECTED Final   Klebsiella pneumoniae NOT DETECTED NOT DETECTED Final   Proteus species NOT DETECTED NOT DETECTED Final   Salmonella species NOT DETECTED NOT DETECTED Final   Serratia marcescens NOT DETECTED NOT DETECTED Final   Haemophilus influenzae NOT DETECTED NOT DETECTED Final   Neisseria meningitidis NOT DETECTED NOT DETECTED Final   Pseudomonas aeruginosa NOT DETECTED NOT DETECTED Final   Stenotrophomonas maltophilia NOT DETECTED NOT DETECTED Final   Candida albicans NOT DETECTED NOT DETECTED Final   Candida auris NOT DETECTED NOT DETECTED Final   Candida glabrata NOT DETECTED NOT DETECTED Final   Candida krusei NOT DETECTED NOT DETECTED Final   Candida parapsilosis NOT DETECTED NOT DETECTED Final   Candida tropicalis NOT DETECTED NOT DETECTED Final   Cryptococcus neoformans/gattii NOT DETECTED NOT DETECTED Final   Meth resistant mecA/C  and MREJ NOT DETECTED NOT DETECTED Final    Comment: Performed at [facility], [address] [place], [place] [address]  CULTURE, BLOOD (ROUTINE X 2) w Reflex to ID Panel     Status: Abnormal   Collection Time: 09/06/20 11:42 AM   Specimen: BLOOD  Result Value Ref Range Status   Specimen Description   Final    BLOOD LEFT HAND Performed at [facility], [address] [place], [place] [address]    Special Requests   Final    BOTTLES DRAWN AEROBIC AND ANAEROBIC Blood Culture results may not be optimal due to an inadequate volume of blood received in culture bottles Performed at [facility], [address] [place], [place] [address]    Culture  Setup Time   Final    GRAM POSITIVE COCCI IN BOTH AEROBIC AND ANAEROBIC BOTTLES CRITICAL VALUE NOTED.  VALUE IS CONSISTENT WITH PREVIOUSLY REPORTED AND CALLED VALUE. Performed at [facility], [address] [place], [place] [address]    Culture (A)  Final    STAPHYLOCOCCUS AUREUS SUSCEPTIBILITIES PERFORMED ON PREVIOUS CULTURE WITHIN THE LAST 5 DAYS. Performed at [facility], [place] [address] [place], [place] [address]    Report Status 09/09/2020 FINAL  Final  CULTURE, BLOOD (ROUTINE X 2) w Reflex to ID Panel     Status: None   Collection Time: 09/07/20 11:09 AM   Specimen: BLOOD  Result Value Ref Range Status   Specimen Description BLOOD LEFT ANTECUBITAL  Final   Special Requests   Final    BOTTLES DRAWN AEROBIC AND ANAEROBIC Blood Culture adequate volume   Culture   Final    NO GROWTH 5 DAYS Performed at [facility], [address] [place], [place] [address]    Report Status 09/12/2020 FINAL  Final  CULTURE, BLOOD (ROUTINE X 2) w Reflex to ID Panel     Status: None (Preliminary result)   Collection Time: 09/09/20  1:55 PM   Specimen: BLOOD  Result Value Ref Range Status   Specimen Description BLOOD BLOOD LEFT HAND  Final   Special Requests   Final    BOTTLES DRAWN  AEROBIC AND ANAEROBIC Blood Culture adequate volume   Culture   Final    NO GROWTH 4 DAYS Performed at [facility], [address], [place], [place] [address]    Report Status PENDING  Incomplete  CULTURE, BLOOD (ROUTINE X 2) w Reflex to ID Panel     Status: None (Preliminary result)   Collection Time: 09/09/20  2:08 PM   Specimen: BLOOD  Result Value Ref Range Status   Specimen Description BLOOD LEFT ANTECUBITAL  Final   Special Requests   Final    BOTTLES DRAWN AEROBIC AND ANAEROBIC Blood Culture adequate volume   Culture   Final    NO GROWTH 4 DAYS Performed at [facility], [address] [place], [place] [address]    Report Status PENDING  Incomplete          IMAGING    No results found.   Nutrition Status: Nutrition Problem: Increased nutrient needs Etiology: catabolic illness ([id number]) Signs/Symptoms: estimated needs Interventions: Refer to RD note for recommendations     Indwelling Urinary Catheter continued, requirement due to   Reason to continue Indwelling Urinary Catheter strict Intake/Output monitoring for hemodynamic instability         Ventilator continued, requirement due to severe respiratory failure   Ventilator Sedation RASS 0 to -2      ASSESSMENT AND PLAN SYNOPSIS 30 yo male recent hospitalization for perforated diverticulitis s/p colostomy with reversal and leakage of anastomosis with s/p revision now acutely hypoxemic with COVID19 induced severe ARDS. failure to wean from VENT-self extubation and trial of extubation has failed Severe encephalopathy l;eading to inability to protect airway  Severe ACUTE Hypoxic and Hypercapnic Respiratory Failure -continue Full MV support -continue Bronchodilator Therapy -Wean Fio2 and PEEP as tolerated -VAP/VENT bundle implementation Needs Trach and PEG tube for survival  ACUTE DIASTOLIC CARDIAC FAILURE-  -oxygen as needed -Lasix as tolerated   Morbid obesity, possible  OSA.   Will certainly impact respiratory mechanics, ventilator weaning Suspect will need to consider additional PEEP     NEUROLOGY Acute toxic metabolic encephalopathy, need for sedation Goal RASS -2 to -3  CARDIAC ICU monitoring   GI GI PROPHYLAXIS as indicated  DIET-->NPO Constipation protocol as indicated  ENDO - will use ICU hypoglycemic\Hyperglycemia protocol if indicated  ELECTROLYTES -follow labs as needed -replace as needed -pharmacy consultation and following   DVT/GI PRX ordered and assessed TRANSFUSIONS AS NEEDED MONITOR FSBS I Assessed the need for Labs I Assessed the need for Foley I Assessed the need for Central Venous Line Family Discussion when available I Assessed the need for Mobilization I made an Assessment of medications to be adjusted accordingly Safety Risk assessment completed   CASE DISCUSSED IN MULTIDISCIPLINARY ROUNDS WITH ICU TEAM  Critical Care Time devoted to patient care services described in this note is 45 minutes.   Overall, patient is critically ill, prognosis is guarded.     Corrin Parker, M.D.  [facility]  Medical Director [place] Director [facility] Cardio-Pulmonary Department

## 2020-09-13 NOTE — Progress Notes (Signed)
Peripherally Inserted Central Catheter Placement  The IV Nurse has discussed with the patient and/or persons authorized to consent for the patient, the purpose of this procedure and the potential benefits and risks involved with this procedure.  The benefits include less needle sticks, lab draws from the catheter, and the patient may be discharged home with the catheter. Risks include, but not limited to, infection, bleeding, blood clot (thrombus formation), and puncture of an artery; nerve damage and irregular heartbeat and possibility to perform a PICC exchange if needed/ordered by physician.  Alternatives to this procedure were also discussed.  Bard Power PICC patient education guide, fact sheet on infection prevention and patient information card has been provided to patient /or left at bedside.    PICC Placement Documentation  PICC Triple Lumen 09/13/20 PICC Right Cephalic 41 cm 0 cm (Active)  Indication for Insertion or Continuance of Line Vasoactive infusions;Poor Vasculature-patient has had multiple peripheral attempts or PIVs lasting less than 24 hours 09/13/20 1239  Exposed Catheter (cm) 0 cm 09/13/20 1239  Site Assessment Clean;Dry;Intact 09/13/20 1239  Lumen #1 Status Flushed;Saline locked;Blood return noted 09/13/20 1239  Lumen #2 Status Flushed;Saline locked;Blood return noted 09/13/20 1239  Lumen #3 Status Flushed;Saline locked;Blood return noted 09/13/20 1239  Dressing Type Transparent;Securing device 09/13/20 1239  Dressing Status Clean;Dry;Intact 09/13/20 1239  Antimicrobial disc in place? Yes 09/13/20 1239  Safety Lock Not Applicable 40/98/11 9147  Dressing Intervention New dressing 09/13/20 1239  Dressing Change Due 09/20/20 09/13/20 [name], Craig Apachito 09/13/2020, 12:41 PM

## 2020-09-13 NOTE — Transfer of Care (Signed)
Immediate Anesthesia Transfer of Care Note  Patient: Craig [name]  Procedure(s) Performed: TRACHEOSTOMY (N/A )  Patient Location: ICU  Anesthesia Type:General  Level of Consciousness: sedated and Patient remains intubated per anesthesia plan  Airway & Oxygen Therapy: Patient remains intubated per anesthesia plan and Patient placed on Ventilator (see vital sign flow sheet for setting)  Post-op Assessment: Report given to RN and Post -op Vital signs reviewed and stable  Post vital signs: stable  Last Vitals:  Vitals Value Taken Time  BP 105/64   Temp    Pulse 122   Resp 16   SpO2 97     Last Pain:  Vitals:   09/12/20 0745  TempSrc: Oral  PainSc:       Patients Stated Pain Goal: 0 (02/58/52 7782)  Complications: No complications documented.

## 2020-09-14 ENCOUNTER — Encounter: Payer: Self-pay | Admitting: Registered Nurse

## 2020-09-14 ENCOUNTER — Encounter
Admission: EM | Disposition: A | Discharge status: Other Acute Inpatient Hospital | Payer: Self-pay | Source: Home / Self Care | Attending: Internal Medicine

## 2020-09-14 ENCOUNTER — Encounter: Payer: Self-pay | Admitting: Unknown Physician Specialty

## 2020-09-14 DIAGNOSIS — A419 Sepsis, unspecified organism: Secondary | ICD-10-CM | POA: Diagnosis not present

## 2020-09-14 DIAGNOSIS — J9601 Acute respiratory failure with hypoxia: Secondary | ICD-10-CM | POA: Diagnosis not present

## 2020-09-14 DIAGNOSIS — U071 COVID-19: Secondary | ICD-10-CM | POA: Diagnosis not present

## 2020-09-14 DIAGNOSIS — J15211 Pneumonia due to Methicillin susceptible Staphylococcus aureus: Secondary | ICD-10-CM | POA: Diagnosis not present

## 2020-09-14 DIAGNOSIS — G9341 Metabolic encephalopathy: Secondary | ICD-10-CM | POA: Diagnosis not present

## 2020-09-14 DIAGNOSIS — R7881 Bacteremia: Secondary | ICD-10-CM | POA: Diagnosis not present

## 2020-09-14 DIAGNOSIS — N179 Acute kidney failure, unspecified: Secondary | ICD-10-CM | POA: Diagnosis not present

## 2020-09-14 HISTORY — PX: PEG PLACEMENT: SHX5437

## 2020-09-14 LAB — CULTURE, BLOOD (ROUTINE X 2)
Culture: NO GROWTH
Culture: NO GROWTH
Special Requests: ADEQUATE
Special Requests: ADEQUATE

## 2020-09-14 LAB — RENAL FUNCTION PANEL
Albumin: 1.8 g/dL — ABNORMAL LOW (ref 3.5–5.0)
Anion gap: 8 (ref 5–15)
BUN: <5 mg/dL — ABNORMAL LOW (ref 6–20)
CO2: 30 mmol/L (ref 22–32)
Calcium: 8 mg/dL — ABNORMAL LOW (ref 8.9–10.3)
Chloride: 104 mmol/L (ref 98–111)
Creatinine, Ser: <0.3 mg/dL — ABNORMAL LOW (ref 0.61–1.24)
Glucose, Bld: 212 mg/dL — ABNORMAL HIGH (ref 70–99)
Phosphorus: 1.9 mg/dL — ABNORMAL LOW (ref 2.5–4.6)
Potassium: 4.1 mmol/L (ref 3.5–5.1)
Sodium: 142 mmol/L (ref 135–145)

## 2020-09-14 LAB — MAGNESIUM: Magnesium: 1.8 mg/dL (ref 1.7–2.4)

## 2020-09-14 LAB — GLUCOSE, CAPILLARY
Glucose-Capillary: 143 mg/dL — ABNORMAL HIGH (ref 70–99)
Glucose-Capillary: 157 mg/dL — ABNORMAL HIGH (ref 70–99)
Glucose-Capillary: 157 mg/dL — ABNORMAL HIGH (ref 70–99)
Glucose-Capillary: 178 mg/dL — ABNORMAL HIGH (ref 70–99)
Glucose-Capillary: 179 mg/dL — ABNORMAL HIGH (ref 70–99)
Glucose-Capillary: 209 mg/dL — ABNORMAL HIGH (ref 70–99)

## 2020-09-14 LAB — TRIGLYCERIDES: Triglycerides: 218 mg/dL — ABNORMAL HIGH (ref <150)

## 2020-09-14 SURGERY — INSERTION, PEG TUBE

## 2020-09-14 MED ORDER — MIDODRINE HCL 5 MG PO TABS
10.0000 mg | ORAL_TABLET | Freq: Three times a day (TID) | ORAL | Status: DC
  Administered 2020-09-14 – 2020-09-19 (×13): 10 mg
  Filled 2020-09-14 (×13): qty 2

## 2020-09-14 MED ORDER — VECURONIUM BROMIDE 10 MG IV SOLR
10.0000 mg | INTRAVENOUS | Status: DC | PRN
  Filled 2020-09-14: qty 10

## 2020-09-14 MED ORDER — SODIUM CHLORIDE 0.9 % IV SOLN: INTRAVENOUS | Status: DC

## 2020-09-14 MED ORDER — NOREPINEPHRINE 16 MG/250ML-% IV SOLN
0.0000 ug/min | INTRAVENOUS | Status: DC
  Administered 2020-09-14: 12 ug/min via INTRAVENOUS
  Filled 2020-09-14: qty 250

## 2020-09-14 MED ORDER — LORAZEPAM 2 MG/ML IJ SOLN
2.0000 mg | INTRAMUSCULAR | Status: DC | PRN
  Administered 2020-09-15 – 2020-09-24 (×20): 2 mg via INTRAVENOUS
  Filled 2020-09-14 (×20): qty 1

## 2020-09-14 MED ORDER — MAGNESIUM SULFATE 2 GM/50ML IV SOLN
2.0000 g | Freq: Once | INTRAVENOUS | Status: AC
  Administered 2020-09-14: 2 g via INTRAVENOUS
  Filled 2020-09-14: qty 50

## 2020-09-14 MED ORDER — K PHOS MONO-SOD PHOS DI & MONO 155-852-130 MG PO TABS
500.0000 mg | ORAL_TABLET | ORAL | Status: AC
  Administered 2020-09-14: 500 mg
  Filled 2020-09-14 (×2): qty 2

## 2020-09-14 NOTE — Consult Note (Signed)
Cardiology Consultation Note    Patient ID: [name], MRN: [id number], DOB/AGE: 32-Mar-1992 32 y.o. Admit date: 08/09/2020   Date of Consult: 09/14/2020 Primary Physician: [name], MD Primary Cardiologist: none  Chief Complaint: respiratory failure Reason for Consultation: bacteremia for tee Requesting MD: Dr. [name]  HPI: Craig [name] is a 30 y.o. male with history of recent hospitalization for perforated diverticulitis status post colostomy with reversal and leakage of anastomosis admitted with acute hypoxemia with COVID-19 induced ARDS.  Has required tracheostomy for failure to wean.  Per infectious disease, requested transesophageal echo.  Had MSSA bacteremia.  TEE was requested to evaluate for evidence of endocarditis.  Is currently on cefazolin.  Cultures currently normal.  Past Medical History:  Diagnosis Date  . Gout   . Hypertension   . Rupture of bowel [facility])       Surgical History:  Past Surgical History:  Procedure Laterality Date  . COLONOSCOPY WITH PROPOFOL N/A 05/18/2020   Procedure: COLONOSCOPY WITH PROPOFOL;  Surgeon: Benjamine Sprague, DO;  Location: [facility] ENDOSCOPY;  Service: General;  Laterality: N/A;  . COLOSTOMY N/A 11/22/2019   Procedure: COLOSTOMY;  Surgeon: Herbert Pun, MD;  Location: [facility] ORS;  Service: General;  Laterality: N/A;  . LAPAROTOMY N/A 11/22/2019   Procedure: EXPLORATORY LAPAROTOMY;  Surgeon: Herbert Pun, MD;  Location: [facility] ORS;  Service: General;  Laterality: N/A;  . LAPAROTOMY N/A 07/08/2020   Procedure: EXPLORATORY LAPAROTOMY;  Surgeon: Herbert Pun, MD;  Location: [facility] ORS;  Service: General;  Laterality: N/A;  . PARTIAL COLECTOMY N/A 11/22/2019   Procedure: PARTIAL COLECTOMY;  Surgeon: Herbert Pun, MD;  Location: [facility] ORS;  Service: General;  Laterality: N/A;  . XI ROBOTIC ASSISTED COLOSTOMY TAKEDOWN N/A 06/28/2020   Procedure: XI ROBOTIC ASSISTED COLOSTOMY TAKEDOWN CONVERTED  TO OPEN PROCEDURE;  Surgeon: Herbert Pun, MD;  Location: [facility] ORS;  Service: General;  Laterality: N/A;     Home Meds: Prior to Admission medications   Medication Sig Start Date End Date Taking? Authorizing Provider  allopurinol (ZYLOPRIM) 300 MG tablet Take 300 mg by mouth daily. 07/04/20  Yes [provider]  amLODipine-valsartan (EXFORGE) 10-320 MG tablet Take 1 tablet by mouth daily. 07/05/20  Yes [provider]  carvedilol (COREG) 25 MG tablet Take 25 mg by mouth 2 (two) times daily. 07/04/20  Yes [provider]  ondansetron (ZOFRAN-ODT) 4 MG disintegrating tablet Take 1 tablet (4 mg total) by mouth every 6 (six) hours as needed for nausea. 07/03/20  Yes Herbert Pun, MD  spironolactone (ALDACTONE) 25 MG tablet Take 25 mg by mouth daily.  05/12/20  Yes [provider]    Inpatient Medications:  . allopurinol  300 mg Per Tube Daily  . chlorhexidine gluconate (MEDLINE KIT)  15 mL Mouth Rinse BID  . Chlorhexidine Gluconate Cloth  6 each Topical Daily  . clotrimazole-betamethasone   Topical BID  . feeding supplement (PROSource TF)  45 mL Per Tube TID  . insulin aspart  0-15 Units Subcutaneous Q4H  . insulin aspart  3 Units Subcutaneous Q4H  . mouth rinse  15 mL Mouth Rinse 10 times per day  . midodrine  10 mg Per Tube TID WC  . oxyCODONE  5 mg Per Tube Q6H  . phosphorus  500 mg Per Tube Q4H  . sodium chloride flush  10-40 mL Intracatheter Q12H   . sodium chloride Stopped (08/22/20 0039)  . sodium chloride Stopped (09/11/20 1217)  . sodium chloride 250 mL (09/13/20 1309)  .  ceFAZolin (ANCEF) IV 2 g (09/14/20 1204)  . famotidine (PEPCID) IV Stopped (09/14/20 1058)  . feeding supplement (VITAL 1.5 CAL) Stopped (09/14/20 0005)  . fentaNYL infusion INTRAVENOUS 350 mcg/hr (09/14/20 1200)  . midazolam 5 mg/hr (09/14/20 1202)  . norepinephrine (LEVOPHED) Adult infusion 12 mcg/min (09/14/20 1200)  . valproate sodium 52.5 mL/hr at  09/14/20 1200    Allergies:  Allergies  Allergen Reactions  . Amoxicillin Rash    Social History   Socioeconomic History  . Marital status: Married    Spouse name: Not on file  . Number of children: Not on file  . Years of education: Not on file  . Highest education level: Not on file  Occupational History  . Not on file  Tobacco Use  . Smoking status: Never Smoker  . Smokeless tobacco: Current User    Types: Chew  Vaping Use  . Vaping Use: Never used  Substance and Sexual Activity  . Alcohol use: Yes    Comment: pint to a fifth per day per patient . Patient reports he hasn't had anything to drink in three weeks.  . Drug use: Never  . Sexual activity: Yes    Birth control/protection: None  Other Topics Concern  . Not on file  Social History Narrative  . Not on file   Social Determinants of Health   Financial Resource Strain: Not on file  Food Insecurity: Not on file  Transportation Needs: Not on file  Physical Activity: Not on file  Stress: Not on file  Social Connections: Not on file  Intimate Partner Violence: Not on file     Family History  Problem Relation Age of Onset  . Healthy Mother   . Healthy Father      Review of Systems: A 12-system review of systems was performed and is negative except as noted in the HPI.  Labs: No results for input(s): CKTOTAL, CKMB, TROPONINI in the last 72 hours. Lab Results  Component Value Date   WBC 6.8 09/12/2020   HGB 9.2 (L) 09/12/2020   HCT 29.4 (L) 09/12/2020   MCV 91.0 09/12/2020   PLT 141 (L) 09/12/2020    Recent Labs  Lab 09/14/20 0447  NA 142  K 4.1  CL 104  CO2 30  BUN <5*  CREATININE <0.30*  CALCIUM 8.0*  GLUCOSE 212*   Lab Results  Component Value Date   TRIG 218 (H) 09/14/2020   Lab Results  Component Value Date   DDIMER 1.07 (H) 08/31/2020    Radiology/Studies:  CT ABDOMEN WO CONTRAST  Result Date: 09/13/2020 CLINICAL DATA:  Evaluate anatomy prior to potential percutaneous  gastrostomy tube placement. EXAM: CT ABDOMEN WITHOUT CONTRAST TECHNIQUE: Multidetector CT imaging of the abdomen was performed following the standard protocol without IV contrast. COMPARISON:  Chest radiograph-09/09/2020 FINDINGS: The lack of intravenous contrast limits the ability to evaluate solid abdominal organs. Lower chest: Limited visualization of the lower thorax demonstrates a trace left-sided pleural effusion. Nodular interstitial and airspace opacities are seen within the imaged portions of the bilateral lung bases. Hepatobiliary: Nodularity of the hepatic contour (image 20, series 2). The gallbladder is distended but without definitive evidence of gallbladder wall thickening or pericholecystic fluid on this noncontrast examination. No radiopaque gallstones. No ascites. Pancreas: Normal noncontrast appearance of the pancreas. Spleen: Normal noncontrast appearance spleen. Adrenals/Urinary Tract: Normal noncontrast appearance of the bilateral kidneys. No renal stones. No urine obstruction or perinephric stranding. Normal noncontrast appearance the bilateral adrenal glands. The urinary bladder was not  imaged. Stomach/Bowel: The anterior wall the stomach is well apposed against the ventral wall of the upper abdomen without interposition either the hepatic parenchyma or the transverse colon and the percutaneous window will likely be improved with gastric insufflation. Note is made of an end colostomy within the ventral aspect the left mid hemiabdomen. Enteric tube terminates within the proximal descending duodenum. No evidence of enteric obstruction. No pneumoperitoneum, pneumatosis or portal venous gas. Vascular/Lymphatic: Normal caliber of the abdominal aorta. No bulky retroperitoneal or mesenteric lymphadenopathy. Other: Diastasis of the midline abdominal musculature with eventration but without definitive hernia. Mild diffuse body wall anasarca. Musculoskeletal: No acute or aggressive osseous. IMPRESSION:  1. Gastric anatomy amenable to potential percutaneous gastrostomy tube placement as indicated. 2. End colostomy within ventral aspect of the left mid abdomen without evidence of enteric obstruction. 3. Trace right-sided pleural effusion with extensive nodular airspace opacities within the imaged bilateral lung bases compatible provided history of COVID 19 pneumonia. 4. Nodularity hepatic contour as could be seen in the setting of cirrhotic change. Electronically Signed   By: Sandi Mariscal M.D.   On: 09/13/2020 15:13   DG Chest 1 View  Result Date: 08/21/2020 CLINICAL DATA:  Respiratory failure secondary to COVID-19 pneumonia. EXAM: CHEST  1 VIEW COMPARISON:  08/20/2020 FINDINGS: Endotracheal tube tip approximately 3.5 cm above the carina. Gastric decompression tube positioning stable. The heart size and mediastinal contours are within normal limits. Overall pulmonary aeration slightly improved in both upper lung zones. Severe residual bilateral airspace disease remains present. No pneumothorax or significant pleural fluid. The visualized skeletal structures are unremarkable. IMPRESSION: Slightly improved aeration in both upper lung zones. Electronically Signed   By: Aletta Edouard M.D.   On: 08/21/2020 11:34   DG Chest 1 View  Result Date: 08/20/2020 CLINICAL DATA:  Repositioned to prone position EXAM: CHEST  1 VIEW COMPARISON:  08/20/2020 FINDINGS: Endotracheal tube is 3.8 cm above the carina. NG tube is in the stomach. Extensive bilateral airspace disease, unchanged. Heart is normal size. No effusions or pneumothorax. IMPRESSION: Continued extensive bilateral airspace disease, unchanged. Electronically Signed   By: Rolm Baptise M.D.   On: 08/20/2020 18:00   DG Abd 1 View  Result Date: 09/06/2020 CLINICAL DATA:  OG tube placement EXAM: ABDOMEN - 1 VIEW COMPARISON:  August 29, 2020 FINDINGS: The enteric tube projects over the gastric body. The bowel gas pattern is nonobstructive where visualized.  IMPRESSION: The enteric tube projects over the gastric body. Electronically Signed   By: Constance Holster M.D.   On: 09/06/2020 01:13   DG Abd 1 View  Result Date: 08/29/2020 CLINICAL DATA:  Line placement EXAM: ABDOMEN - 1 VIEW COMPARISON:  August 13, 2020 FINDINGS: The enteric tube projects over the gastric body/fundus. The bowel gas pattern is nonobstructive where visualized. There are no definite radiopaque kidney stones. IMPRESSION: Enteric tube projects over the gastric body/fundus. Electronically Signed   By: Constance Holster M.D.   On: 08/29/2020 23:13   MR BRAIN WO CONTRAST  Result Date: 08/25/2020 CLINICAL DATA:  Anoxic brain injury.  COVID with ARDS. EXAM: MRI HEAD WITHOUT CONTRAST TECHNIQUE: Multiplanar, multiecho pulse sequences of the brain and surrounding structures were obtained without intravenous contrast. COMPARISON:  None. FINDINGS: Brain: No acute infarction, hemorrhage, hydrocephalus, extra-axial collection or mass lesion. No sign of anoxic injury. No chronic white matter disease or atrophy. Vascular: Normal flow voids. Skull and upper cervical spine: Normal marrow signal Sinuses/Orbits: Diffuse sinus opacification by mucosal thickening with left maxillary fluid level.  Confluent opacification of the bilateral middle ear and mastoids. There is nasopharyngeal fluid in the setting of intubation. IMPRESSION: 1. Normal MRI of the brain. 2. Diffuse sinusitis and otomastoid opacification in the setting of intubation. Electronically Signed   By: Monte Fantasia M.D.   On: 08/25/2020 11:50   US Venous Img Lower Bilateral (DVT)  Result Date: 09/08/2020 CLINICAL DATA:  Bacteremia, colonic perforation, COVID pneumonia and respiratory failure. Lower extremity discoloration. EXAM: BILATERAL LOWER EXTREMITY VENOUS DOPPLER ULTRASOUND TECHNIQUE: Gray-scale sonography with graded compression, as well as color Doppler and duplex ultrasound were performed to evaluate the lower extremity deep  venous systems from the level of the common femoral vein and including the common femoral, femoral, profunda femoral, popliteal and calf veins including the posterior tibial, peroneal and gastrocnemius veins when visible. The superficial great saphenous vein was also interrogated. Spectral Doppler was utilized to evaluate flow at rest and with distal augmentation maneuvers in the common femoral, femoral and popliteal veins. COMPARISON:  None. FINDINGS: RIGHT LOWER EXTREMITY Common Femoral Vein: No evidence of thrombus. Normal compressibility, respiratory phasicity and response to augmentation. Saphenofemoral Junction: No evidence of thrombus. Normal compressibility and flow on color Doppler imaging. Profunda Femoral Vein: No evidence of thrombus. Normal compressibility and flow on color Doppler imaging. Femoral Vein: No evidence of thrombus. Normal compressibility, respiratory phasicity and response to augmentation. Popliteal Vein: No evidence of thrombus. Normal compressibility, respiratory phasicity and response to augmentation. Calf Veins: No evidence of thrombus. Normal compressibility and flow on color Doppler imaging. Superficial Great Saphenous Vein: No evidence of thrombus. Normal compressibility. Venous Reflux:  None. Other Findings: No evidence of superficial thrombophlebitis or abnormal fluid collection. LEFT LOWER EXTREMITY Common Femoral Vein: No evidence of thrombus. Normal compressibility, respiratory phasicity and response to augmentation. Saphenofemoral Junction: No evidence of thrombus. Normal compressibility and flow on color Doppler imaging. Profunda Femoral Vein: No evidence of thrombus. Normal compressibility and flow on color Doppler imaging. Femoral Vein: No evidence of thrombus. Normal compressibility, respiratory phasicity and response to augmentation. Popliteal Vein: No evidence of thrombus. Normal compressibility, respiratory phasicity and response to augmentation. Calf Veins: No evidence  of thrombus. Normal compressibility and flow on color Doppler imaging. Superficial Great Saphenous Vein: No evidence of thrombus. Normal compressibility. Venous Reflux:  None. Other Findings: No evidence of superficial thrombophlebitis or abnormal fluid collection. IMPRESSION: No evidence of deep venous thrombosis in either lower extremity. Electronically Signed   By: Aletta Edouard M.D.   On: 09/08/2020 09:38   DG Chest Port 1 View  Result Date: 09/09/2020 CLINICAL DATA:  Intubated, enteric tube placement EXAM: PORTABLE CHEST 1 VIEW COMPARISON:  Chest radiograph from one day prior. FINDINGS: Endotracheal tube tip is 3.0 cm above the carina. Enteric tube enters stomach with the tip not seen on this image. Stable cardiomediastinal silhouette with normal heart size. No pneumothorax. No pleural effusion. Severe patchy opacities throughout the right greater than left lungs, not appreciably changed, with slightly improved lung volumes. IMPRESSION: 1. Well-positioned endotracheal tube. 2. Enteric tube enters stomach with the tip not seen on this image. 3. No appreciable change in severe patchy opacities throughout the right greater than left lungs, with slightly improved lung volumes. Findings are compatible with COVID-19 pneumonia. Electronically Signed   By: Ilona Sorrel M.D.   On: 09/09/2020 16:35   DG Chest Port 1 View  Result Date: 09/08/2020 CLINICAL DATA:  Respiratory failure.  COVID-19 positive EXAM: PORTABLE CHEST 1 VIEW COMPARISON:  September 06, 2020 FINDINGS: Endotracheal tube and  nasogastric tube have been removed. No pneumothorax. There remains multifocal airspace opacity bilaterally, more severe and extensive on the right than on the left, stable. No new opacity evident. Heart is upper normal in size with pulmonary vascularity normal. No adenopathy. No bone lesions. IMPRESSION: Multifocal airspace opacity, more severe on the right than the left, remains stable. No new opacity. Stable cardiac  silhouette. No pneumothorax. Electronically Signed   By: Lowella Grip III M.D.   On: 09/08/2020 13:36   DG Chest Port 1 View  Result Date: 09/06/2020 CLINICAL DATA:  Respiratory failure EXAM: PORTABLE CHEST 1 VIEW COMPARISON:  September 05, 2020 FINDINGS: The endotracheal tube terminates above the carina. The right-sided PICC line terminates over the SVC. The enteric tube extends below the left hemidiaphragm. There are hazy bilateral airspace opacities. No pneumothorax. No large pleural effusion. The heart size is stable. IMPRESSION: 1. Lines and tubes as above. 2. Hazy bilateral airspace opacities are again noted. Electronically Signed   By: Constance Holster M.D.   On: 09/06/2020 01:14   DG Chest Port 1 View  Result Date: 09/05/2020 CLINICAL DATA:  Endotracheal tube placement EXAM: PORTABLE CHEST 1 VIEW COMPARISON:  September 04, 2020 FINDINGS: The support devices appear stable. Bilateral airspace opacities are again noted. There is no pneumothorax. No large pleural effusion. The heart size is stable from prior study. There is no acute osseous abnormality. IMPRESSION: No significant short interval change. Electronically Signed   By: Constance Holster M.D.   On: 09/05/2020 04:30   DG Chest Port 1 View  Result Date: 09/04/2020 CLINICAL DATA:  COVID positive, intubated, fever EXAM: PORTABLE CHEST 1 VIEW COMPARISON:  09/02/2020 chest radiograph. FINDINGS: Endotracheal tube tip is 4.3 cm above the carina. Enteric tube terminates in the gastric fundus. Right PICC terminates over the lower third of the SVC. Right rotated chest radiograph. Stable cardiomediastinal silhouette with normal heart size. No pneumothorax. No pleural effusion. Extensive patchy opacities throughout the right greater than left lungs, unchanged. IMPRESSION: 1. Well-positioned support structures. 2. Stable extensive patchy opacities throughout the right greater than left lungs, compatible with COVID-19 pneumonia. Electronically Signed    By: Ilona Sorrel M.D.   On: 09/04/2020 07:52   DG Chest Port 1 View  Result Date: 09/02/2020 CLINICAL DATA:  30 year old male with respiratory failure. COVID-19. Sepsis. EXAM: PORTABLE CHEST 1 VIEW COMPARISON:  Portable chest 08/29/2020 and earlier. FINDINGS: Portable AP semi upright view at 0423 hours. Stable endotracheal tube, visible enteric tube and right PICC line. Lower lung volumes. Stable cardiac size and mediastinal contours. Widespread coarse bilateral interstitial opacity with patchy perihilar and basilar confluence. Allowing for lower lung volumes ventilation has not significantly changed since 08/29/2020. No pneumothorax or pleural effusion. No acute osseous abnormality identified. IMPRESSION: 1.  Stable lines and tubes. 2. Bilateral pneumonia. Ventilation not significantly changed since 08/29/2020 when allowing for lower lung volumes today. Electronically Signed   By: Genevie Ann M.D.   On: 09/02/2020 04:53   DG Chest Port 1 View  Result Date: 08/29/2020 CLINICAL DATA:  Acute respiratory failure EXAM: PORTABLE CHEST 1 VIEW COMPARISON:  August 29, 2020 FINDINGS: The endotracheal tube terminates above the carina. The right-sided PICC line is well position. The enteric tube extends below the left hemidiaphragm. There are diffuse bilateral coarse airspace opacities, similar to prior study. There is no pneumothorax. No definite large pleural effusion. IMPRESSION: No significant short interval change. Electronically Signed   By: Constance Holster M.D.   On: 08/29/2020 23:12  DG Chest Port 1 View  Result Date: 08/29/2020 CLINICAL DATA:  Acute respiratory failure EXAM: PORTABLE CHEST 1 VIEW COMPARISON:  Yesterday FINDINGS: Endotracheal tube tip is at the clavicular heads. The enteric tube reaches the stomach. Right PICC with tip at the SVC. Bilateral pulmonary infiltrate which is unchanged. No visible effusion or air leak. Stable heart size. IMPRESSION: Stable hardware positioning and bilateral  pneumonia. Electronically Signed   By: Monte Fantasia M.D.   On: 08/29/2020 05:47   DG Chest Port 1 View  Result Date: 08/28/2020 CLINICAL DATA:  Acute respiratory failure EXAM: PORTABLE CHEST 1 VIEW COMPARISON:  Radiograph 08/27/2020 FINDINGS: Right upper extremity PICC tip terminates in the mid SVC. Endotracheal tube tip terminates in the mid trachea, 4 cm from the carina. Transesophageal tube tip and side port terminate below the margins of imaging, beyond the GE junction. Telemetry leads and support devices overlie the chest. Persistent heterogeneous opacities are present throughout both lungs, right greater than left similar in extent to prior counting for differences and pulmonary inflation. Small right effusion with apical capping. No left effusion. No visible pneumothorax. Stable cardiomediastinal contours. No acute osseous or soft tissue abnormality. IMPRESSION: 1. Persistent heterogeneous opacities throughout both lungs, right greater than left. Appear similar to prior accounting for slightly improved lung volumes. 2. Small right effusion with apical capping. 3. Lines and tubes as above. Electronically Signed   By: Lovena Le M.D.   On: 08/28/2020 04:28   DG Chest Port 1 View  Result Date: 08/27/2020 CLINICAL DATA:  COVID-19.  Cardiac arrest. EXAM: PORTABLE CHEST 1 VIEW COMPARISON:  August 27, 2020 FINDINGS: The ETT is in good position. The NG tube terminates below today's film. Bilateral pulmonary infiltrates remain, right greater than left, increased in the interval. No pneumothorax. A new right PICC line terminates in the central SVC. No other interval changes. IMPRESSION: 1. Support apparatus as above. 2. Bilateral pulmonary infiltrates, right greater than left, increased in the interval. Electronically Signed   By: Dorise Bullion III M.D   On: 08/27/2020 12:57   DG Chest Port 1 View  Result Date: 08/27/2020 CLINICAL DATA:  Acute respiratory failure. EXAM: PORTABLE CHEST 1 VIEW COMPARISON:   Radiograph yesterday. FINDINGS: Endotracheal tube tip 4.9 cm from the carina. Enteric tube tip and side-port below the diaphragm in the stomach. Slight improvement in patchy bilateral lung opacities, right greater than left. Stable heart size and mediastinal contours. No pneumomediastinum or pneumothorax. No significant pleural fluid. IMPRESSION: 1. Slight improvement in patchy bilateral lung opacities since yesterday, right greater than left. 2. Stable support apparatus. Electronically Signed   By: Keith Rake M.D.   On: 08/27/2020 02:57   DG Chest Port 1 View  Result Date: 08/26/2020 CLINICAL DATA:  Intubated patient.  Respiratory distress. EXAM: PORTABLE CHEST 1 VIEW COMPARISON:  08/25/2020 and older exams. FINDINGS: Endotracheal tube tip projects 4 cm above the carina. Nasal/orogastric tube passes well below the diaphragm into the stomach. Lungs demonstrate patchy bilateral airspace opacities without significant change from the previous day's study. No new lung abnormalities. No convincing pleural effusion and no pneumothorax. IMPRESSION: 1. Endotracheal tube tip projects 4 cm above the carina. Well-positioned nasal/orogastric tube. 2. No change in lung aeration with bilateral airspace lung opacities consistent with multifocal infection. Electronically Signed   By: Lajean Manes M.D.   On: 08/26/2020 11:22   DG Chest Port 1 View  Result Date: 08/25/2020 CLINICAL DATA:  COVID-19 EXAM: PORTABLE CHEST 1 VIEW COMPARISON:  08/22/2020 FINDINGS:  Endotracheal tube and partially imaged enteric tube are unchanged. Patient is rotated. Persistent bilateral pulmonary opacities, right greater than left. No significant pleural effusion. No pneumothorax. IMPRESSION: Persistent bilateral pulmonary opacities, right greater than left. Aeration is stable to minimally improved. Electronically Signed   By: Macy Mis M.D.   On: 08/25/2020 15:38   DG Chest Port 1 View  Result Date: 08/22/2020 CLINICAL DATA:  Acute  respiratory failure, COVID ARDS EXAM: PORTABLE CHEST 1 VIEW COMPARISON:  08/21/2020 FINDINGS: Endotracheal tube remains 4.3 cm above the carina. NG tube enters the stomach with the tip not visualized. Esophageal temp probe noted at the T4 level. Slight improvement in the severe bilateral airspace process compared to yesterday. No developing effusion or pneumothorax. Stable cardiomegaly. No acute osseous finding. IMPRESSION: Slight improvement in diffuse bilateral airspace process compatible with COVID ARDS. Stable support apparatus. Electronically Signed   By: Jerilynn Mages.  Shick M.D.   On: 08/22/2020 14:06   DG Chest Port 1 View  Result Date: 08/20/2020 CLINICAL DATA:  Acute respiratory failure with hypoxia EXAM: PORTABLE CHEST 1 VIEW COMPARISON:  Chest radiograph from one day prior. FINDINGS: Endotracheal tube tip is 3.8 cm above the carina. Enteric tube terminates in the gastric fundus. Stable cardiomediastinal silhouette with normal heart size. No pneumothorax. No pleural effusion. Extensive patchy consolidation throughout both lungs, worsened. IMPRESSION: 1. Well-positioned support structures. 2. Extensive patchy consolidation throughout both lungs, worsened, compatible with multilobar pneumonia. Electronically Signed   By: Ilona Sorrel M.D.   On: 08/20/2020 07:36   DG Chest Port 1 View  Result Date: 08/19/2020 CLINICAL DATA:  Acute respiratory failure, hypoxia EXAM: PORTABLE CHEST 1 VIEW COMPARISON:  The 08/15/2020 FINDINGS: Endotracheal tube is seen 4.5 cm above the carina. Nasogastric tube extends into the left upper quadrant of the abdomen with its tip within the expected gastric fundus. The lungs are well expanded and pulmonary insufflation is preserved. Extensive, diffuse, patchy pulmonary infiltrate is again identified and appears slightly progressive since prior examination, likely infectious or inflammatory. No pneumothorax or pleural effusion. Cardiac size within normal limits. IMPRESSION: Stable  support tubes. Progressive, extensive infectious or inflammatory pulmonary infiltrate. Electronically Signed   By: Fidela Salisbury MD   On: 08/19/2020 05:07   DG Abd Portable 1V  Result Date: 09/09/2020 CLINICAL DATA:  Enteric tube placement EXAM: PORTABLE ABDOMEN - 1 VIEW COMPARISON:  09/06/2020 abdominal radiograph FINDINGS: Weighted enteric tube tip is in the body of the stomach. No dilated small bowel loops. No evidence of pneumatosis or pneumoperitoneum. Patchy opacities at both lung bases. IMPRESSION: Weighted enteric tube tip is in the body of the stomach. Electronically Signed   By: Ilona Sorrel M.D.   On: 09/09/2020 16:52   ECHOCARDIOGRAM COMPLETE  Result Date: 09/07/2020    ECHOCARDIOGRAM REPORT   Patient Name:   TARRENCE ENCK Date of Exam: 09/07/2020 Medical Rec #:  [id number]             Height:       72.0 in Accession #:    [id number]            Weight:       231.9 lb Date of Birth:  11-26-1990              BSA:          2.269 m Patient Age:    29 years              BP:  70/46 mmHg Patient Gender: M                     HR:           80 bpm. Exam Location:  [facility] Procedure: 2D Echo, Cardiac Doppler and Color Doppler Indications:     Bacteremia R78.81  History:         Patient has prior history of Echocardiogram examinations, most                  recent 08/17/2020. Risk Factors:Hypertension.  Sonographer:     Sherrie Sport RDCS (AE) Referring Phys:  [id number] Rosine Door Diagnosing Phys: Kate Sable MD  Sonographer Comments: Technically challenging study due to limited acoustic windows, no apical window, no subcostal window and echo performed with patient supine and on artificial respirator. IMPRESSIONS  1. Left ventricular ejection fraction, by estimation, is 60 to 65%. The left ventricle has normal function. The left ventricle has no regional wall motion abnormalities. There is mild left ventricular hypertrophy. Left ventricular diastolic function could not be evaluated.  2.  Right ventricular systolic function is normal. The right ventricular size is not well visualized.  3. The mitral valve is normal in structure. No evidence of mitral valve regurgitation.  4. The aortic valve was not well visualized. Aortic valve regurgitation is not visualized. FINDINGS  Left Ventricle: Left ventricular ejection fraction, by estimation, is 60 to 65%. The left ventricle has normal function. The left ventricle has no regional wall motion abnormalities. The left ventricular internal cavity size was normal in size. There is  mild left ventricular hypertrophy. Left ventricular diastolic function could not be evaluated. Right Ventricle: The right ventricular size is not well visualized. No increase in right ventricular wall thickness. Right ventricular systolic function is normal. Left Atrium: Left atrial size was normal in size. Right Atrium: Right atrial size was not well visualized. Pericardium: There is no evidence of pericardial effusion. Mitral Valve: The mitral valve is normal in structure. No evidence of mitral valve regurgitation. Tricuspid Valve: The tricuspid valve is not well visualized. Tricuspid valve regurgitation is not demonstrated. Aortic Valve: The aortic valve was not well visualized. Aortic valve regurgitation is not visualized. Pulmonic Valve: The pulmonic valve was not well visualized. Pulmonic valve regurgitation is not visualized. Aorta: The aortic root is normal in size and structure. Venous: IVC assessment for right atrial pressure unable to be performed due to mechanical ventilation. IAS/Shunts: No atrial level shunt detected by color flow Doppler.  LEFT VENTRICLE PLAX 2D LVIDd:         4.11 cm LVIDs:         2.32 cm LV PW:         2.00 cm LV IVS:        1.30 cm LVOT diam:     2.00 cm LVOT Area:     3.14 cm  LEFT ATRIUM         Index LA diam:    3.30 cm 1.45 cm/m                        PULMONIC VALVE AORTA                 PV Vmax:        0.73 m/s Ao Root diam: 3.23 cm PV Peak  grad:   2.1 mmHg  RVOT Peak grad: 2 mmHg  TRICUSPID VALVE TR Peak grad:   8.6 mmHg TR Vmax:        147.00 cm/s  SHUNTS Systemic Diam: 2.00 cm Kate Sable MD Electronically signed by Kate Sable MD Signature Date/Time: 09/07/2020/4:23:21 PM    Final    ECHOCARDIOGRAM COMPLETE  Result Date: 08/17/2020    ECHOCARDIOGRAM REPORT   Patient Name:   GERHARDT GLEED Date of Exam: 08/17/2020 Medical Rec #:  [id number]             Height:       72.0 in Accession #:    [id number]            Weight:       259.9 lb Date of Birth:  May 06, 1991              BSA:          2.382 m Patient Age:    29 years              BP:           154/79 mmHg Patient Gender: M                     HR:           76 bpm. Exam Location:  [facility] Procedure: 2D Echo, Cardiac Doppler and Color Doppler Indications:     Adult respiratory distress syndrome  History:         Patient has no prior history of Echocardiogram examinations.                  Risk Factors:Hypertension.  Sonographer:     Sherrie Sport RDCS (AE) Referring Phys:  [id number] Tyna Jaksch Diagnosing Phys: Bartholome Bill MD  Sonographer Comments: Echo performed with patient supine and on artificial respirator and Technically difficult study due to poor echo windows. IMPRESSIONS  1. Left ventricular ejection fraction, by estimation, is 70 to 75%. The left ventricle has hyperdynamic function. The left ventricle has no regional wall motion abnormalities. Left ventricular diastolic parameters were normal.  2. Right ventricular systolic function is normal. The right ventricular size is mildly enlarged.  3. The mitral valve was not well visualized. Trivial mitral valve regurgitation.  4. The aortic valve was not well visualized. Aortic valve regurgitation is not visualized. FINDINGS  Left Ventricle: Left ventricular ejection fraction, by estimation, is 70 to 75%. The left ventricle has hyperdynamic function. The left ventricle has no regional wall motion  abnormalities. The left ventricular internal cavity size was normal in size. There is no left ventricular hypertrophy. Left ventricular diastolic parameters were normal. Right Ventricle: The right ventricular size is mildly enlarged. No increase in right ventricular wall thickness. Right ventricular systolic function is normal. Left Atrium: Left atrial size was normal in size. Right Atrium: Right atrial size was normal in size. Pericardium: There is no evidence of pericardial effusion. Mitral Valve: The mitral valve was not well visualized. Trivial mitral valve regurgitation. Tricuspid Valve: The tricuspid valve is not well visualized. Tricuspid valve regurgitation is trivial. Aortic Valve: The aortic valve was not well visualized. Aortic valve regurgitation is not visualized. Pulmonic Valve: The pulmonic valve was not assessed. Pulmonic valve regurgitation is not visualized. Aorta: The aortic root is normal in size and structure. IAS/Shunts: The interatrial septum was not assessed.  LEFT VENTRICLE PLAX 2D LVIDd:         4.34 cm LVIDs:  2.32 cm LV PW:         1.30 cm LV IVS:        1.44 cm LVOT diam:     2.20 cm LVOT Area:     3.80 cm  LEFT ATRIUM         Index LA diam:    3.30 cm 1.39 cm/m   AORTA Ao Root diam: 3.20 cm  SHUNTS Systemic Diam: 2.20 cm Bartholome Bill MD Electronically signed by Bartholome Bill MD Signature Date/Time: 08/17/2020/12:20:12 PM    Final    [place] EKG SITE RITE  Result Date: 09/13/2020 If Site Rite image not attached, placement could not be confirmed due to current cardiac rhythm.  [place] EKG SITE RITE  Result Date: 08/26/2020 If Site Rite image not attached, placement could not be confirmed due to current cardiac rhythm.   Wt Readings from Last 3 Encounters:  09/14/20 115.4 kg  07/08/20 117.9 kg  06/28/20 120.2 kg    EKG: Normal sinus  Physical Exam: Chronically ill-appearing male with tracheostomy in place Blood pressure 134/65, pulse (!) 120, temperature 99.8 F (37.7 C),  temperature source Oral, resp. rate (!) 24, height 6' 0.01" (1.829 m), weight 115.4 kg, SpO2 92 %. Body mass index is 34.5 kg/m. General: Well developed, well nourished, in no acute distress. Head: Normocephalic, atraumatic, sclera non-icteric, no xanthomas, nares are without discharge.  Neck: Negative for carotid bruits. JVD not elevated. Lungs: Clear bilaterally to auscultation without wheezes, rales, or rhonchi. Breathing is unlabored. Heart: RRR with S1 S2. No murmurs, rubs, or gallops appreciated. Abdomen: Soft, non-tender, non-distended with normoactive bowel sounds. No hepatomegaly. No rebound/guarding. No obvious abdominal masses. Msk:  Strength and tone appear normal for age. Extremities: No clubbing or cyanosis. No edema.  Distal pedal pulses are 2+ and equal bilaterally. Neuro: Alert and oriented X 3. No facial asymmetry. No focal deficit. Moves all extremities spontaneously. Psych:  Responds to questions appropriately with a normal affect.     Assessment and Plan  Patient with a history of Covid pneumonia with respiratory failure and ARDS.  Currently on a ventilator with a tracheostomy.  Getting a PEG tube today.  Requested to do transesophageal echo by infectious disease due to MSSA bacteremia.  Will tentatively plan on doing this tomorrow.  Would need to hold tube feedings for 6 hours prior to the procedure.  Signed, Teodoro Spray MD 09/14/2020, 12:41 PM Pager: [phone]

## 2020-09-14 NOTE — Anesthesia Postprocedure Evaluation (Signed)
Anesthesia Post Note  Patient: Craig [name]  Procedure(s) Performed: TRACHEOSTOMY (N/A )  Patient location during evaluation: SICU Anesthesia Type: General Level of consciousness: sedated Pain management: pain level controlled Vital Signs Assessment: post-procedure vital signs reviewed and stable Respiratory status: patient on ventilator - see flowsheet for VS Lurline Idol) Cardiovascular status: stable Postop Assessment: no apparent nausea or vomiting Anesthetic complications: no   No complications documented.   Last Vitals:  Vitals:   09/14/20 0715 09/14/20 0748  BP:    Pulse:    Resp:    Temp: 37.7 C   SpO2:  92%    Last Pain:  Vitals:   09/14/20 0715  TempSrc: Oral  PainSc:                  Precious Haws Tiaria Biby

## 2020-09-14 NOTE — Op Note (Addendum)
Preoperative diagnosis: Malnutrition / respiratory failure  Postoperative diagnosis: Malnutrition / respiratory failure  Procedure: Percutaneous endoscopic gastrostomy.    Anesthesia: Sedation  Surgeon: Dr. [name] Assistant: Peyton Najjar for sterile portion of exam  Wound Classification: Clean Contaminated  Indications: Patient is a 30 y.o. male required prolonged enteral nutrition because of respiratory failure due to COVID 19 infection. Percutaneous endoscopic gastrostomy was chosen as the route of nutritional support.   Findings: 1. Adequate transilumination with endoscope on upper abdomen 2. Endoscopy confirmed correct placement of gastrostomy in stomach  Specimen: None  Complications: None  Estimated Blood Loss: Minimal mL  Description of procedure: Procedure done at bedside.  Timeout performed. sedated with fentanyl and Versed prior to beginning procedure.  I performed the endoscopy portion of this procedure.   a mouthpiece was placed in the patient's mouth and the endoscope was passed down into the stomach. No abnormalities were noted. The stomach was insufflated with air and the endoscope positioned in the midportion and directed toward the anterior abdominal wall. Easy transillumination noted on the skin of the abdominal wall in the left upper quadrant, with palpable confirmation of location as well on endo view.   polypectomy snare passed into the stomach.The overlying skin was anesthetized with lidocaine and a 1.0-cm incision was made at the chosen site. The introducer needle with overlying catheter was passed through this incision and into the stomach under visualization with the gastroscope. The guide wire was passed and snared. Endoscope withdrawn along with snare and guide wire.and pulled it back out of the mouth. Attached the gastrostomy tube to the loop of the guide wire and pulled the whole thing back into the stomach until the 5-cm mark of the gastrostomy tube was noted  at skin level.  Endoscope reintroduced into stomach and adequate placement of the gastrostomy tube was identified and a picture was taken. With no obvious complications. The gastrostomy tube end was cut to length and the clamping appendage was placed. A collecting bag was applied.  The patient tolerated the procedure well and remains in ICU

## 2020-09-14 NOTE — Progress Notes (Signed)
CRITICAL CARE NOTE  30 yo male recent hospitalization for perforated diverticulitis s/p colostomy with reversal and leakage of anastomosis with s/p revision now acutely hypoxemic with COVID19 induced severe ARDS.   INTERVAL EVENTS: 08/11/20-patient continued to decline with worsening respiratory status despite 100%Fio2 on BIPAP and HFNC failure. He stated that he feels he is dying and cannot breathe. I discussed with wife [name] earlier today that patient is critically ill may need ETT, she is agreeable and thankful for care. I specifically discussed and explained intubation and mechanical ventilation to patient and he wishes to proceed.  08/12/20- patient was weaned on FiO2 to 60%. Spoke to wife [name].  08/13/20- Patient weaned to 40%. During SBT today he self extubated and was placed on HFNC. He subsequently became hypoxemic, disoriented aggitated, removed PIVs and colostomy and proceeded to have combative behavior with worsening oxygenation. I have attempted to calm patient and encouraged him to cooperate. After numerous attempts patient continued to be combative with severe aggitation with combative behavior and would not wear oxygen. Patient was placed on sedation and MV. Wife updated.  08/14/20-patient remains critically ill. Weaned from 100%>>55%. Called and updated wife [name] today 12/27-Patient with worsening oxygenation on FiO2 100% peep of 14 08/16/20-Patient with continued high oxygen requirements overnight. Have spoken with surgery and there should be no problem proning patient with this. Was febrile yesterday and thus broad spectrum abx started and cultures attained.  08-17-20-patient down to PEEP 10 FiO2 55%. Patient never required proning 08/18/20:FiO2 weaned to 40%, PEEP 10, plan for diuresis with Diamox today, adding PO Clonazepam to assist weaning sedation 08/19/20: Vent changed to Pressure Control this morning: 50% FiO2, 24, 22/5, Plan for Recruitment maneuvers  and diuresis 08/20/2020:Continued issues with hypercapnia due to the dead space ventilation. Worsening despite pressure control. Switch back to [facility], prone positioning 08/23/19- patient proned today without incident, continue to wean FiO2 , currently on 50% 08/24/19-patient weaned to 40% FiO2, plan to continue proning with full scope of care. I have spoken to [name] (wife of patient today) she would like to be present prior to next extubation attempt to help console patient and decrease his aggitation. 1/72filed weaning trials, ver agitated , severe hypoxia, Wife at bedside witnessed failed SAT. 1/6 severe resp failure, hypoxia 1/7 proning for 16 hrs, severe ARDS 1/8 PICC line placed, Had a brief PEA arrest while prone. Proningdiscontinued 1/10severe resp failure 1/11severe ARDS, [facility] 09/02/20-patient with no acute events. Spoke with TJonelle Sidletoday answered questions and plan is for trache.  09/03/20- patient remains on MV. FiO2 down to 35%PRVC plan for trache. 09/04/20- patient with TG>1000 will switch off propofol but patient with very high sedation requirement, will discuss options with pharmD. Plan for trache. Met with wife [name] at bedside. 1/20extubated, wife at bedside, very weak cough, encephalopathy 1/21 re-intubated, severe resp failure [facility], severe [facility]   CC  follow up respiratory failure  SUBJECTIVE Patient remains critically ill Prognosis is guarded   BP (!) 107/53 (BP Location: Left Arm)   Pulse (!) 120   Temp 99.8 F (37.7 C) (Oral)   Resp (!) 24   Ht 6' 0.01" (1.829 m)   Wt 115.4 kg   SpO2 92%   BMI 34.50 kg/m    I/O last 3 completed shifts: In: 5774.8 [I.V.:3193.4; NG/GT:1928; IV Piggyback:653.5] Out: 3025 [Urine:2375; Stool:650] Total I/O In: 154.3 [I.V.:101.8; IV Piggyback:52.5] Out: 50 [Urine:50]  SpO2: 92 % O2 Flow Rate  (L/min): 40 L/min FiO2 (%): 30 %  Estimated body mass index is 34.5 kg/m as calculated from the following:   Height as of this encounter: 6' 0.01" (1.829 m).   Weight as of this encounter: 115.4 kg.  SIGNIFICANT EVENTS   REVIEW OF SYSTEMS  PATIENT IS UNABLE TO PROVIDE COMPLETE REVIEW OF SYSTEMS DUE TO SEVERE CRITICAL ILLNESS        PHYSICAL EXAMINATION:  GENERAL:critically ill appearing, +resp distress HEAD: Normocephalic, atraumatic.  EYES: Pupils equal, round, reactive to light.  No scleral icterus.  MOUTH: Moist mucosal membrane. NECK: Supple. S/p trach PULMONARY: +rhonchi, +wheezing CARDIOVASCULAR: S1 and S2. Regular rate and rhythm. No murmurs, rubs, or gallops.  GASTROINTESTINAL: Soft, nontender, -distended.  Positive bowel sounds.   MUSCULOSKELETAL: +edema.  NEUROLOGIC: obtunded, GCS<8 SKIN:intact,warm,dry  MEDICATIONS: I have reviewed all medications and confirmed regimen as documented   CULTURE RESULTS   Recent Results (from the past 240 hour(s))  Culture, respiratory     Status: None   Collection Time: 09/06/20  6:27 AM   Specimen: Tracheal Aspirate; Respiratory  Result Value Ref Range Status   Specimen Description   Final    TRACHEAL ASPIRATE Performed at [facility], [address] [place], [place] [address]    Special Requests   Final    NONE Performed at [facility], [address] [place], [place] [address]    Gram Stain   Final    MODERATE WBC PRESENT,BOTH PMN AND MONONUCLEAR ABUNDANT GRAM POSITIVE COCCI IN PAIRS IN CLUSTERS Performed at [facility], [place] [address], [place], [place] [address]    Culture MODERATE STAPHYLOCOCCUS AUREUS  Final   Report Status 09/08/2020 FINAL  Final   Organism ID, Bacteria STAPHYLOCOCCUS AUREUS  Final      Susceptibility   Staphylococcus aureus - MIC*    CIPROFLOXACIN <=0.5 SENSITIVE Sensitive     ERYTHROMYCIN >=8 RESISTANT Resistant     GENTAMICIN <=0.5 SENSITIVE Sensitive      OXACILLIN <=0.25 SENSITIVE Sensitive     TETRACYCLINE <=1 SENSITIVE Sensitive     VANCOMYCIN 1 SENSITIVE Sensitive     TRIMETH/SULFA <=10 SENSITIVE Sensitive     CLINDAMYCIN RESISTANT Resistant     RIFAMPIN <=0.5 SENSITIVE Sensitive     Inducible Clindamycin POSITIVE Resistant     * MODERATE STAPHYLOCOCCUS AUREUS  CULTURE, BLOOD (ROUTINE X 2) w Reflex to ID Panel     Status: Abnormal   Collection Time: 09/06/20  9:35 AM   Specimen: BLOOD  Result Value Ref Range Status   Specimen Description   Final    BLOOD LINE Performed at [facility], [address] [place], [place] [address]    Special Requests   Final    BOTTLES DRAWN AEROBIC AND ANAEROBIC Blood Culture adequate volume Performed at [facility], [address] [place], [place] [address]    Culture  Setup Time   Final    GRAM POSITIVE COCCI IN BOTH AEROBIC AND ANAEROBIC BOTTLES CRITICAL RESULT CALLED TO, READ BACK BY AND VERIFIED WITH: JASON ROBINS '@[id number]'  ON 09/16/20 SKL Performed at [facility], [place] [address], [place],  [address]    Culture STAPHYLOCOCCUS AUREUS (A)  Final   Report Status 09/09/2020 FINAL  Final   Organism ID, Bacteria STAPHYLOCOCCUS AUREUS  Final      Susceptibility   Staphylococcus aureus - MIC*    CIPROFLOXACIN <=0.5 SENSITIVE Sensitive     ERYTHROMYCIN  RESISTANT Resistant     GENTAMICIN <=0.5 SENSITIVE Sensitive     OXACILLIN 0.5 SENSITIVE Sensitive     TETRACYCLINE <=1 SENSITIVE Sensitive     VANCOMYCIN 1 SENSITIVE Sensitive     TRIMETH/SULFA <=10 SENSITIVE Sensitive     CLINDAMYCIN RESISTANT Resistant     RIFAMPIN <=0.5 SENSITIVE Sensitive     Inducible Clindamycin POSITIVE Resistant     * STAPHYLOCOCCUS AUREUS  Blood Culture ID Panel (Reflexed)     Status: Abnormal   Collection Time: 09/06/20  9:35 AM  Result Value Ref Range Status   Enterococcus faecalis NOT DETECTED NOT DETECTED Final   Enterococcus Faecium NOT DETECTED NOT DETECTED Final   Listeria  monocytogenes NOT DETECTED NOT DETECTED Final   Staphylococcus species DETECTED (A) NOT DETECTED Final    Comment: CRITICAL RESULT CALLED TO, READ BACK BY AND VERIFIED WITH: JASON ROBINS '@[id number]'  ON 09/06/20 SKL    Staphylococcus aureus (BCID) DETECTED (A) NOT DETECTED Final    Comment: CRITICAL RESULT CALLED TO, READ BACK BY AND VERIFIED WITH: JASON ROBINS '@[id number]'  ON 09/06/20 SKL    Staphylococcus epidermidis NOT DETECTED NOT DETECTED Final   Staphylococcus lugdunensis NOT DETECTED NOT DETECTED Final   Streptococcus species NOT DETECTED NOT DETECTED Final   Streptococcus agalactiae NOT DETECTED NOT DETECTED Final   Streptococcus pneumoniae NOT DETECTED NOT DETECTED Final   Streptococcus pyogenes NOT DETECTED NOT DETECTED Final   A.calcoaceticus-baumannii NOT DETECTED NOT DETECTED Final   Bacteroides fragilis NOT DETECTED NOT DETECTED Final   Enterobacterales NOT DETECTED NOT DETECTED Final   Enterobacter cloacae complex NOT DETECTED NOT DETECTED Final   Escherichia coli NOT DETECTED NOT DETECTED Final   Klebsiella aerogenes NOT DETECTED NOT DETECTED Final   Klebsiella oxytoca NOT DETECTED NOT DETECTED Final   Klebsiella pneumoniae NOT DETECTED NOT DETECTED Final   Proteus species NOT DETECTED NOT DETECTED Final   Salmonella species NOT DETECTED NOT DETECTED Final   Serratia marcescens NOT DETECTED NOT DETECTED Final   Haemophilus influenzae NOT DETECTED NOT DETECTED Final   Neisseria meningitidis NOT DETECTED NOT DETECTED Final   Pseudomonas aeruginosa NOT DETECTED NOT DETECTED Final   Stenotrophomonas maltophilia NOT DETECTED NOT DETECTED Final   Candida albicans NOT DETECTED NOT DETECTED Final   Candida auris NOT DETECTED NOT DETECTED Final   Candida glabrata NOT DETECTED NOT DETECTED Final   Candida krusei NOT DETECTED NOT DETECTED Final   Candida parapsilosis NOT DETECTED NOT DETECTED Final   Candida tropicalis NOT DETECTED NOT DETECTED Final   Cryptococcus neoformans/gattii NOT  DETECTED NOT DETECTED Final   Meth resistant mecA/C and MREJ NOT DETECTED NOT DETECTED Final    Comment: Performed at [facility], [address] [place], [place] [address]  CULTURE, BLOOD (ROUTINE X 2) w Reflex to ID Panel     Status: Abnormal   Collection Time: 09/06/20 11:42 AM   Specimen: BLOOD  Result Value Ref Range Status   Specimen Description   Final    BLOOD LEFT HAND Performed at [facility], [address] [place], [place] [address]    Special Requests   Final    BOTTLES DRAWN AEROBIC AND ANAEROBIC Blood Culture results may not be optimal due to an inadequate volume of blood received in culture bottles Performed at [facility], [address] [place], [place] [address]    Culture  Setup Time   Final    GRAM POSITIVE COCCI IN BOTH AEROBIC AND ANAEROBIC BOTTLES CRITICAL VALUE NOTED.  VALUE IS  CONSISTENT WITH PREVIOUSLY REPORTED AND CALLED VALUE. Performed at [facility], [address] [place], [place] [address]    Culture (A)  Final    STAPHYLOCOCCUS AUREUS SUSCEPTIBILITIES PERFORMED ON PREVIOUS CULTURE WITHIN THE LAST 5 DAYS. Performed at [facility], [place] [address] [place], [place] [address]    Report Status 09/09/2020 FINAL  Final  CULTURE, BLOOD (ROUTINE X 2) w Reflex to ID Panel     Status: None   Collection Time: 09/07/20 11:09 AM   Specimen: BLOOD  Result Value Ref Range Status   Specimen Description BLOOD LEFT ANTECUBITAL  Final   Special Requests   Final    BOTTLES DRAWN AEROBIC AND ANAEROBIC Blood Culture adequate volume   Culture   Final    NO GROWTH 5 DAYS Performed at [facility], [address] [place], [place] [address]    Report Status 09/12/2020 FINAL  Final  CULTURE, BLOOD (ROUTINE X 2) w Reflex to ID Panel     Status: None (Preliminary result)   Collection Time: 09/09/20  1:55 PM   Specimen: BLOOD  Result Value Ref Range Status   Specimen Description BLOOD BLOOD LEFT HAND   Final   Special Requests   Final    BOTTLES DRAWN AEROBIC AND ANAEROBIC Blood Culture adequate volume   Culture   Final    NO GROWTH 4 DAYS Performed at [facility], [address] [place], [place] [address]    Report Status PENDING  Incomplete  CULTURE, BLOOD (ROUTINE X 2) w Reflex to ID Panel     Status: None (Preliminary result)   Collection Time: 09/09/20  2:08 PM   Specimen: BLOOD  Result Value Ref Range Status   Specimen Description BLOOD LEFT ANTECUBITAL  Final   Special Requests   Final    BOTTLES DRAWN AEROBIC AND ANAEROBIC Blood Culture adequate volume   Culture   Final    NO GROWTH 4 DAYS Performed at [facility], [address], [place], [place] [address]    Report Status PENDING  Incomplete          IMAGING    CT ABDOMEN WO CONTRAST  Result Date: 09/13/2020 CLINICAL DATA:  Evaluate anatomy prior to potential percutaneous gastrostomy tube placement. EXAM: CT ABDOMEN WITHOUT CONTRAST TECHNIQUE: Multidetector CT imaging of the abdomen was performed following the standard protocol without IV contrast. COMPARISON:  Chest radiograph-09/09/2020 FINDINGS: The lack of intravenous contrast limits the ability to evaluate solid abdominal organs. Lower chest: Limited visualization of the lower thorax demonstrates a trace left-sided pleural effusion. Nodular interstitial and airspace opacities are seen within the imaged portions of the bilateral lung bases. Hepatobiliary: Nodularity of the hepatic contour (image 20, series 2). The gallbladder is distended but without definitive evidence of gallbladder wall thickening or pericholecystic fluid on this noncontrast examination. No radiopaque gallstones. No ascites. Pancreas: Normal noncontrast appearance of the pancreas. Spleen: Normal noncontrast appearance spleen. Adrenals/Urinary Tract: Normal noncontrast appearance of the bilateral kidneys. No renal stones. No urine obstruction or perinephric stranding. Normal  noncontrast appearance the bilateral adrenal glands. The urinary bladder was not imaged. Stomach/Bowel: The anterior wall the stomach is well apposed against the ventral wall of the upper abdomen without interposition either the hepatic parenchyma or the transverse colon and the percutaneous window will likely be improved with gastric insufflation. Note is made of an end colostomy within the ventral aspect the left mid hemiabdomen. Enteric tube terminates within the proximal descending duodenum. No evidence  of enteric obstruction. No pneumoperitoneum, pneumatosis or portal venous gas. Vascular/Lymphatic: Normal caliber of the abdominal aorta. No bulky retroperitoneal or mesenteric lymphadenopathy. Other: Diastasis of the midline abdominal musculature with eventration but without definitive hernia. Mild diffuse body wall anasarca. Musculoskeletal: No acute or aggressive osseous. IMPRESSION: 1. Gastric anatomy amenable to potential percutaneous gastrostomy tube placement as indicated. 2. End colostomy within ventral aspect of the left mid abdomen without evidence of enteric obstruction. 3. Trace right-sided pleural effusion with extensive nodular airspace opacities within the imaged bilateral lung bases compatible provided history of COVID 19 pneumonia. 4. Nodularity hepatic contour as could be seen in the setting of cirrhotic change. Electronically Signed   By: Sandi Mariscal M.D.   On: 09/13/2020 15:13   [place] EKG SITE RITE  Result Date: 09/13/2020 If Site Rite image not attached, placement could not be confirmed due to current cardiac rhythm.    Nutrition Status: Nutrition Problem: Increased nutrient needs Etiology: catabolic illness ([id number]) Signs/Symptoms: estimated needs Interventions: Refer to RD note for recommendations     Indwelling Urinary Catheter continued, requirement due to   Reason to continue Indwelling Urinary Catheter strict Intake/Output monitoring for hemodynamic instability          Ventilator continued, requirement due to severe respiratory failure   Ventilator Sedation RASS 0 to -2      ASSESSMENT AND PLAN SYNOPSIS 30 yo male recent hospitalization for perforated diverticulitis s/p colostomy with reversal and leakage of anastomosis with s/p revision now acutely hypoxemic with COVID19 induced severe ARDS.failure to wean from VENT-self extubation and trial of extubation has failed Severe encephalopathy leading to inability to protect airway  Severe ACUTE Hypoxic and Hypercapnic Respiratory Failure -continue Full MV support -continue Bronchodilator Therapy -Wean Fio2 and PEEP as tolerated -will perform SAT/SBT when respiratory parameters are met -VAP/VENT bundle implementation S/p [facility]  ACUTE DIASTOLIC CARDIAC FAILURE-  -oxygen as needed -Lasix as tolerated   Morbid obesity, possible OSA.   Will certainly impact respiratory mechanics, ventilator weaning Suspect will need to consider additional PEEP S/p trach    NEUROLOGY Acute toxic metabolic encephalopathy, need for sedation Goal RASS -2 to -3   CARDIAC ICU monitoring  ID -continue IV abx as prescibed -follow up cultures  GI GI PROPHYLAXIS as indicated Plan for PEG tube today  DIET-->TF's as tolerated Constipation protocol as indicated  ENDO - will use ICU hypoglycemic\Hyperglycemia protocol if indicated     ELECTROLYTES -follow labs as needed -replace as needed -pharmacy consultation and following   DVT/GI PRX ordered and assessed TRANSFUSIONS AS NEEDED MONITOR FSBS I Assessed the need for Labs I Assessed the need for Foley I Assessed the need for Central Venous Line Family Discussion when available I Assessed the need for Mobilization I made an Assessment of medications to be adjusted accordingly Safety Risk assessment completed   CASE DISCUSSED IN MULTIDISCIPLINARY ROUNDS WITH ICU TEAM  Critical Care Time devoted to patient care services described in this note is  45  minutes.   Overall, patient is critically ill, prognosis is guarded.   Corrin Parker, M.D.  [facility]  Medical Director [place] Director [facility] Cardio-Pulmonary Department

## 2020-09-14 NOTE — Procedures (Addendum)
Preoperative diagnosis: Malnutrition / Other dysphagia  Postoperative diagnosis: Malnutrition / Other dysphagia  Procedure: Percutaneous endoscopic gastrostomy.    Anesthesia: Sedation  Surgeon: Dr. [name]  Assistant: Dr. Peyton Najjar  Wound Classification: Clean Contaminated  Indications: Patient is a 30 y.o. male required prolonged enteral nutrition because of respiratory failure due to COVID 19 infection. Percutaneous endoscopic gastrostomy was chosen as the route of nutritional support.   Findings: 1. Adequate transilumination with endoscope on upper abdomen 2. Endoscopy confirmed correct placement of gastrostomy in stomach  Description of procedure: The patient was in the ICU and sedated with Versed.  A time-out was completed verifying correct patient, procedure, site, positioning, and implant(s) and/or special equipment prior to beginning this procedure.  After adequate sedation, Dr. [name] put a mouthpiece was placed in the patient's mouth and the endoscope was passed down into the stomach. No abnormalities were noted. The stomach was insufflated with air and the endoscope positioned in the midportion and directed toward the anterior abdominal wall. With the room darkened and intensity turned up on the endoscope, a good light reflex was noted on the skin of the abdominal wall in the left upper quadrant.  While the endoscope was inside the stomach, I did finger pressure at the light reflex with adequate indentation on the stomach wall on endoscopy. I  Passed a polypectomy snare into the stomach, opened fully, and positioned so that the loop encircled the point of demonstrated finger indentation.  The overlying skin was anesthetized with lidocaine and a 1.0-cm incision was made at the chosen site. The introducer needle with overlying catheter was passed through this incision and into the stomach under visualization with the gastroscope. The needle and catheter were gently captured by the  endoscopic snare. The guide wire was passed and snared. Then, Dr. [name] withdrawn the endoscope, snare, and guide wire and pulled it back out of the mouth. Dr. [name] attached the gastrostomy tube to the loop of the guide wire and I pulled the whole thing back into the stomach until the 5-cm mark of the gastrostomy tube was noted at skin level.  Dr. [name] reintroduced the gastroscope and adequate placement of the gastrostomy tube was identified and a picture was taken. The gastrostomy tube end was cut to length and the clamping appendage was placed. A collecting bag was applied.  The patient tolerated the procedure well and was taken to the postanesthesia care unit in good condition.   Specimen: None  Complications: None  Estimated Blood Loss: None

## 2020-09-14 NOTE — Progress Notes (Signed)
[place] for Electrolyte Monitoring and Replacement   Recent Labs: Potassium (mmol/L)  Date Value  09/14/2020 4.1   Magnesium (mg/dL)  Date Value  09/14/2020 1.8   Calcium (mg/dL)  Date Value  09/14/2020 8.0 (L)   Albumin (g/dL)  Date Value  09/14/2020 1.8 (L)   Phosphorus (mg/dL)  Date Value  09/14/2020 1.9 (L)   Sodium (mmol/L)  Date Value  09/14/2020 142   Corrected Ca: 9.8 mg/dL  Assessment: 30 yo male admitted with perforated diverticulitis and severe acute hypoxemic respiratory failure secondary to COVID-19 infection. Patient has severe ARDs and was reintubated 1/21. Pharmacy has been consulted for electrolyte monitoring and replacement.   Goal of Therapy:  Electrolytes WNL   Plan:  1/26 AM labs: Na 142, K 4.1, Phos 1.9, Mg 1.8  K Phos Neutral 2 tabs q4h x 2  Magnesium sulfate 2 g IV x 1  Recheck electrolytes in AM  Pharmacy will continue to follow and replace electrolytes as needed.   Benita Gutter 09/14/2020 8:23 AM

## 2020-09-14 NOTE — Progress Notes (Signed)
ID Pt remains ventilated and sedated Tracheostomy Got peg today LDA- rt arm PICC placed 09/13/20 Foley 08/25/20 BP (!) 111/56   Pulse (!) 107   Temp 99.8 F (37.7 C)   Resp (!) 24   Ht 6' 0.01" (1.829 m)   Wt 115.4 kg   SpO2 99%   BMI 34.50 kg/m    Chest b/l air entry [id number] Edema extremities Peg in place Colostomy Foley catheter  Labs CBC Latest Ref Rng & Units 09/12/2020 09/11/2020 09/10/2020  WBC 4.0 - 10.5 K/uL 6.8 8.9 9.9  Hemoglobin 13.0 - 17.0 g/dL 9.2(L) 10.3(L) 10.1(L)  Hematocrit 39.0 - 52.0 % 29.4(L) 32.8(L) 31.9(L)  Platelets 150 - 400 K/uL 141(L) 158 126(L)   CMP Latest Ref Rng & Units 09/14/2020 09/13/2020 09/12/2020  Glucose 70 - 99 mg/dL 212(H) 153(H) 231(H)  BUN 6 - 20 mg/dL <5(L) <5(L) <5(L)  Creatinine 0.61 - 1.24 mg/dL <0.30(L) <0.30(L) <0.30(L)  Sodium 135 - 145 mmol/L 142 143 146(H)  Potassium 3.5 - 5.1 mmol/L 4.1 3.1(L) 3.1(L)  Chloride 98 - 111 mmol/L 104 104 108  CO2 22 - 32 mmol/L 30 28 25   Calcium 8.9 - 10.3 mg/dL 8.0(L) 7.8(L) 7.9(L)  Total Protein 6.5 - 8.1 g/dL - - -  Total Bilirubin 0.3 - 1.2 mg/dL - - -  Alkaline Phos 38 - 126 U/L - - -  AST 15 - 41 U/L - - -  ALT 0 - 44 U/L - - -   Impression/recommendation MSSA bacteremia and MSSA multifocal pneumonia.  Repeat blood cultures negative 2D echo was a poor study This patient had a PICC line which was removed once he became positive for MSSA he will need TEE to rule out endocarditis.  Discussed with cardiology.  Plan is for tomorrow.  Continue cefazolin  Acute hypoxic respiratory failure due to Covid.  Was intubated from 08/13/2021 until 09/08/2020.  He was extubated that day but had to be reintubated the next day.  Had tracheostomy on 09/13/2020.  Nutrition PEG is placed today  Complicated diverticular history. Perforated diverticulitis in April 2021 leading to urgent colectomy and ostomy.  Takedown of ostomy November 4827 complicated by dehiscence needing partial colectomy and colostomy on  07/08/2020.  Anemia Plan discussed the management with the care team.

## 2020-09-15 ENCOUNTER — Encounter: Payer: Self-pay | Admitting: Surgery

## 2020-09-15 ENCOUNTER — Encounter
Admission: EM | Disposition: A | Discharge status: Other Acute Inpatient Hospital | Payer: Self-pay | Source: Home / Self Care | Attending: Internal Medicine

## 2020-09-15 ENCOUNTER — Inpatient Hospital Stay
Admit: 2020-09-15 | Discharge: 2020-09-15 | Disposition: A | Discharge status: Home / Self Care | Payer: No Typology Code available for payment source | Attending: Cardiology | Admitting: Cardiology

## 2020-09-15 DIAGNOSIS — U071 COVID-19: Secondary | ICD-10-CM

## 2020-09-15 DIAGNOSIS — J80 Acute respiratory distress syndrome: Secondary | ICD-10-CM

## 2020-09-15 LAB — RENAL FUNCTION PANEL
Albumin: 1.7 g/dL — ABNORMAL LOW (ref 3.5–5.0)
Anion gap: 10 (ref 5–15)
BUN: 7 mg/dL (ref 6–20)
CO2: 31 mmol/L (ref 22–32)
Calcium: 8.1 mg/dL — ABNORMAL LOW (ref 8.9–10.3)
Chloride: 101 mmol/L (ref 98–111)
Creatinine, Ser: <0.3 mg/dL — ABNORMAL LOW (ref 0.61–1.24)
Glucose, Bld: 164 mg/dL — ABNORMAL HIGH (ref 70–99)
Phosphorus: 2.6 mg/dL (ref 2.5–4.6)
Potassium: 4 mmol/L (ref 3.5–5.1)
Sodium: 142 mmol/L (ref 135–145)

## 2020-09-15 LAB — GLUCOSE, CAPILLARY
Glucose-Capillary: 134 mg/dL — ABNORMAL HIGH (ref 70–99)
Glucose-Capillary: 135 mg/dL — ABNORMAL HIGH (ref 70–99)
Glucose-Capillary: 182 mg/dL — ABNORMAL HIGH (ref 70–99)
Glucose-Capillary: 184 mg/dL — ABNORMAL HIGH (ref 70–99)
Glucose-Capillary: 197 mg/dL — ABNORMAL HIGH (ref 70–99)
Glucose-Capillary: 209 mg/dL — ABNORMAL HIGH (ref 70–99)

## 2020-09-15 LAB — MAGNESIUM: Magnesium: 1.7 mg/dL (ref 1.7–2.4)

## 2020-09-15 SURGERY — ECHOCARDIOGRAM, TRANSESOPHAGEAL
Anesthesia: Choice

## 2020-09-15 MED ORDER — VALPROIC ACID 250 MG/5ML PO SOLN
250.0000 mg | Freq: Two times a day (BID) | ORAL | Status: DC
  Administered 2020-09-15 – 2020-09-22 (×14): 250 mg
  Filled 2020-09-15 (×15): qty 5

## 2020-09-15 MED ORDER — ENOXAPARIN SODIUM 60 MG/0.6ML ~~LOC~~ SOLN
0.5000 mg/kg | SUBCUTANEOUS | Status: DC
  Administered 2020-09-15: 57.5 mg via SUBCUTANEOUS
  Filled 2020-09-15 (×2): qty 0.6

## 2020-09-15 MED ORDER — FAMOTIDINE 20 MG PO TABS
20.0000 mg | ORAL_TABLET | Freq: Two times a day (BID) | ORAL | Status: DC
  Administered 2020-09-15 – 2020-09-16 (×2): 20 mg
  Filled 2020-09-15 (×2): qty 1

## 2020-09-15 MED ORDER — FENTANYL BOLUS VIA INFUSION
100.0000 ug | Freq: Once | INTRAVENOUS | Status: DC
  Filled 2020-09-15: qty 100

## 2020-09-15 MED ORDER — MIDAZOLAM BOLUS VIA INFUSION
2.0000 mg | Freq: Once | INTRAVENOUS | Status: DC
  Filled 2020-09-15: qty 2

## 2020-09-15 MED ORDER — SODIUM CHLORIDE 0.9 % IV BOLUS: 500.0000 mL | Freq: Once | INTRAVENOUS | Status: DC

## 2020-09-15 MED ORDER — FENTANYL BOLUS VIA INFUSION
100.0000 ug | Freq: Once | INTRAVENOUS | Status: AC
  Administered 2020-09-15: 100 ug via INTRAVENOUS

## 2020-09-15 MED ORDER — MAGNESIUM SULFATE 2 GM/50ML IV SOLN
2.0000 g | Freq: Once | INTRAVENOUS | Status: AC
  Administered 2020-09-15: 2 g via INTRAVENOUS
  Filled 2020-09-15: qty 50

## 2020-09-15 MED ORDER — MIDAZOLAM BOLUS VIA INFUSION
2.0000 mg | Freq: Once | INTRAVENOUS | Status: AC
  Administered 2020-09-15: 2 mg via INTRAVENOUS

## 2020-09-15 MED ORDER — FUROSEMIDE 10 MG/ML IJ SOLN
40.0000 mg | Freq: Once | INTRAMUSCULAR | Status: AC
  Administered 2020-09-16: 40 mg via INTRAVENOUS
  Filled 2020-09-15: qty 4

## 2020-09-15 MED ORDER — VITAL 1.5 CAL PO LIQD
1000.0000 mL | ORAL | Status: DC
  Administered 2020-09-15 – 2020-09-24 (×11): 1000 mL

## 2020-09-15 MED ORDER — FUROSEMIDE 10 MG/ML IJ SOLN
40.0000 mg | Freq: Once | INTRAMUSCULAR | Status: AC
  Administered 2020-09-15: 40 mg via INTRAVENOUS
  Filled 2020-09-15: qty 4

## 2020-09-15 MED ORDER — PROSOURCE TF PO LIQD
45.0000 mL | Freq: Three times a day (TID) | ORAL | Status: DC
  Administered 2020-09-16 – 2020-09-24 (×25): 45 mL
  Filled 2020-09-15 (×27): qty 45

## 2020-09-15 NOTE — Progress Notes (Signed)
PHARMACIST - PHYSICIAN COMMUNICATION  CONCERNING: IV to Oral Route Change Policy  RECOMMENDATION: This patient is receiving famotidine by the intravenous route.  Based on criteria approved by the Pharmacy and Therapeutics Committee, the intravenous medication(s) is/are being converted to the equivalent oral dose form(s).  DESCRIPTION: These criteria include:  The patient is eating (either orally or via tube) and/or has been taking other orally administered medications for a least 24 hours  The patient has no evidence of active gastrointestinal bleeding or impaired GI absorption (gastrectomy, short bowel, patient on TNA or NPO).  If you have questions about this conversion, please contact the [place], [facility] 09/15/2020 2:37 PM

## 2020-09-15 NOTE — Progress Notes (Signed)
CRITICAL CARE NOTE  30 yo male recent hospitalization for perforated diverticulitis s/p colostomy with reversal and leakage of anastomosis with s/p revision now acutely hypoxemic with COVID19 induced severe ARDS.   INTERVAL EVENTS: 08/11/20-patient continued to decline with worsening respiratory status despite 100%Fio2 on BIPAP and HFNC failure. He stated that he feels he is dying and cannot breathe. I discussed with wife [name] earlier today that patient is critically ill may need ETT, she is agreeable and thankful for care. I specifically discussed and explained intubation and mechanical ventilation to patient and he wishes to proceed.  08/12/20- patient was weaned on FiO2 to 60%. Spoke to wife [name].  08/13/20- Patient weaned to 40%. During SBT today he self extubated and was placed on HFNC. He subsequently became hypoxemic, disoriented aggitated, removed PIVs and colostomy and proceeded to have combative behavior with worsening oxygenation. I have attempted to calm patient and encouraged him to cooperate. After numerous attempts patient continued to be combative with severe aggitation with combative behavior and would not wear oxygen. Patient was placed on sedation and MV. Wife updated.  08/14/20-patient remains critically ill. Weaned from 100%>>55%. Called and updated wife [name] today 12/27-Patient with worsening oxygenation on FiO2 100% peep of 14 08/16/20-Patient with continued high oxygen requirements overnight. Have spoken with surgery and there should be no problem proning patient with this. Was febrile yesterday and thus broad spectrum abx started and cultures attained.  08-17-20-patient down to PEEP 10 FiO2 55%. Patient never required proning 08/18/20:FiO2 weaned to 40%, PEEP 10, plan for diuresis with Diamox today, adding PO Clonazepam to assist weaning sedation 08/19/20: Vent changed to Pressure Control this morning: 50% FiO2, 24, 22/5, Plan for Recruitment maneuvers  and diuresis 08/20/2020:Continued issues with hypercapnia due to the dead space ventilation. Worsening despite pressure control. Switch back to [facility], prone positioning 08/23/19- patient proned today without incident, continue to wean FiO2 , currently on 50% 08/24/19-patient weaned to 40% FiO2, plan to continue proning with full scope of care. I have spoken to [name] (wife of patient today) she would like to be present prior to next extubation attempt to help console patient and decrease his aggitation. 1/53filed weaning trials, ver agitated , severe hypoxia, Wife at bedside witnessed failed SAT. 1/6 severe resp failure, hypoxia 1/7 proning for 16 hrs, severe ARDS 1/8 PICC line placed, Had a brief PEA arrest while prone. Proningdiscontinued 1/10severe resp failure 1/11severe ARDS, [facility] 09/02/20-patient with no acute events. Spoke with TJonelle Sidletoday answered questions and plan is for trache.  09/03/20- patient remains on MV. FiO2 down to 35%PRVC plan for trache. 09/04/20- patient with TG>1000 will switch off propofol but patient with very high sedation requirement, will discuss options with pharmD. Plan for trache. Met with wife [name] at bedside. 1/20extubated, wife at bedside, very weak cough, encephalopathy 1/21 re-intubated, severe resp failure [facility]24 needs [facility], severe [facility] failure [facility] unable to do [facility] today  CC  follow up respiratory failure  SUBJECTIVE Patient remains critically ill Prognosis is guarded  BP (!) 115/53   Pulse (!) 115   Temp (!) 100.4 F (38 C) (Oral)   Resp (!) 24   Ht 6' 0.01" (1.829 m)   Wt 115.3 kg   SpO2 95%   BMI 34.47 kg/m   I/O last 3 completed shifts: In: 2926.2 [I.V.:2068.7; IV Piggyback:857.5] Out: 14098[[JXBJY:7829 Emesis/NG output:30; Drains:40; Stool:225] Total I/O In: -  Out: 60 [Urine:60]  SpO2:  95 % O2 Flow Rate (L/min): 0  L/min FiO2 (%): 30 %  Estimated body mass index is 34.47 kg/m as calculated from the following:   Height as of this encounter: 6' 0.01" (1.829 m).   Weight as of this encounter: 115.3 kg.  SIGNIFICANT EVENTS  REVIEW OF SYSTEMS  PATIENT IS UNABLE TO PROVIDE COMPLETE REVIEW OF SYSTEMS DUE TO SEVERE CRITICAL ILLNESS    PHYSICAL EXAMINATION:  GENERAL:critically ill appearing, +resp distress HEAD: Normocephalic, atraumatic.  EYES: Pupils equal, round, reactive to light.  No scleral icterus.  MOUTH: Moist mucosal membrane. NECK: Supple. S/p trach PULMONARY: +rhonchi, +wheezing CARDIOVASCULAR: S1 and S2. Regular rate and rhythm. No murmurs, rubs, or gallops.  GASTROINTESTINAL: Soft, nontender, -distended.  Positive bowel sounds.   MUSCULOSKELETAL: +edema.  NEUROLOGIC: obtunded, GCS<8 SKIN:intact,warm,dry  MEDICATIONS: I have reviewed all medications and confirmed regimen as documented  CULTURE RESULTS   Recent Results (from the past 240 hour(s))  Culture, respiratory     Status: None   Collection Time: 09/06/20  6:27 AM   Specimen: Tracheal Aspirate; Respiratory  Result Value Ref Range Status   Specimen Description   Final    TRACHEAL ASPIRATE Performed at [facility], [address] [place], [place] [address]    Special Requests   Final    NONE Performed at [facility], [address] [place], [place] [address]    Gram Stain   Final    MODERATE WBC PRESENT,BOTH PMN AND MONONUCLEAR ABUNDANT GRAM POSITIVE COCCI IN PAIRS IN CLUSTERS Performed at [facility], [place] [address] [place], [place] [address]    Culture MODERATE STAPHYLOCOCCUS AUREUS  Final   Report Status 09/08/2020 FINAL  Final   Organism ID, Bacteria STAPHYLOCOCCUS AUREUS  Final      Susceptibility   Staphylococcus aureus - MIC*    CIPROFLOXACIN <=0.5 SENSITIVE Sensitive     ERYTHROMYCIN >=8 RESISTANT Resistant     GENTAMICIN <=0.5 SENSITIVE Sensitive     OXACILLIN <=0.25  SENSITIVE Sensitive     TETRACYCLINE <=1 SENSITIVE Sensitive     VANCOMYCIN 1 SENSITIVE Sensitive     TRIMETH/SULFA <=10 SENSITIVE Sensitive     CLINDAMYCIN RESISTANT Resistant     RIFAMPIN <=0.5 SENSITIVE Sensitive     Inducible Clindamycin POSITIVE Resistant     * MODERATE STAPHYLOCOCCUS AUREUS  CULTURE, BLOOD (ROUTINE X 2) w Reflex to ID Panel     Status: Abnormal   Collection Time: 09/06/20  9:35 AM   Specimen: BLOOD  Result Value Ref Range Status   Specimen Description   Final    BLOOD LINE Performed at [facility], [address] [place], [place] [address]    Special Requests   Final    BOTTLES DRAWN AEROBIC AND ANAEROBIC Blood Culture adequate volume Performed at [facility], [address] [place], [place] [address]    Culture  Setup Time   Final    GRAM POSITIVE COCCI IN BOTH AEROBIC AND ANAEROBIC BOTTLES CRITICAL RESULT CALLED TO, READ BACK BY AND VERIFIED WITH: Tess Potts ROBINS '@[id number]'  ON 09/16/20 SKL Performed at [facility], [place] [address] [place], [place] [address]    Culture STAPHYLOCOCCUS AUREUS (A)  Final   Report Status 09/09/2020 FINAL  Final   Organism ID, Bacteria STAPHYLOCOCCUS AUREUS  Final      Susceptibility   Staphylococcus aureus - MIC*    CIPROFLOXACIN <=0.5 SENSITIVE Sensitive     ERYTHROMYCIN RESISTANT Resistant     GENTAMICIN <=  0.5 SENSITIVE Sensitive     OXACILLIN 0.5 SENSITIVE Sensitive     TETRACYCLINE <=1 SENSITIVE Sensitive     VANCOMYCIN 1 SENSITIVE Sensitive     TRIMETH/SULFA <=10 SENSITIVE Sensitive     CLINDAMYCIN RESISTANT Resistant     RIFAMPIN <=0.5 SENSITIVE Sensitive     Inducible Clindamycin POSITIVE Resistant     * STAPHYLOCOCCUS AUREUS  Blood Culture ID Panel (Reflexed)     Status: Abnormal   Collection Time: 09/06/20  9:35 AM  Result Value Ref Range Status   Enterococcus faecalis NOT DETECTED NOT DETECTED Final   Enterococcus Faecium NOT DETECTED NOT DETECTED Final   Listeria monocytogenes NOT  DETECTED NOT DETECTED Final   Staphylococcus species DETECTED (A) NOT DETECTED Final    Comment: CRITICAL RESULT CALLED TO, READ BACK BY AND VERIFIED WITH: Coni Homesley ROBINS '@[id number]'  ON 09/06/20 SKL    Staphylococcus aureus (BCID) DETECTED (A) NOT DETECTED Final    Comment: CRITICAL RESULT CALLED TO, READ BACK BY AND VERIFIED WITH: Zyra Parrillo ROBINS '@[id number]'  ON 09/06/20 SKL    Staphylococcus epidermidis NOT DETECTED NOT DETECTED Final   Staphylococcus lugdunensis NOT DETECTED NOT DETECTED Final   Streptococcus species NOT DETECTED NOT DETECTED Final   Streptococcus agalactiae NOT DETECTED NOT DETECTED Final   Streptococcus pneumoniae NOT DETECTED NOT DETECTED Final   Streptococcus pyogenes NOT DETECTED NOT DETECTED Final   A.calcoaceticus-baumannii NOT DETECTED NOT DETECTED Final   Bacteroides fragilis NOT DETECTED NOT DETECTED Final   Enterobacterales NOT DETECTED NOT DETECTED Final   Enterobacter cloacae complex NOT DETECTED NOT DETECTED Final   Escherichia coli NOT DETECTED NOT DETECTED Final   Klebsiella aerogenes NOT DETECTED NOT DETECTED Final   Klebsiella oxytoca NOT DETECTED NOT DETECTED Final   Klebsiella pneumoniae NOT DETECTED NOT DETECTED Final   Proteus species NOT DETECTED NOT DETECTED Final   Salmonella species NOT DETECTED NOT DETECTED Final   Serratia marcescens NOT DETECTED NOT DETECTED Final   Haemophilus influenzae NOT DETECTED NOT DETECTED Final   Neisseria meningitidis NOT DETECTED NOT DETECTED Final   Pseudomonas aeruginosa NOT DETECTED NOT DETECTED Final   Stenotrophomonas maltophilia NOT DETECTED NOT DETECTED Final   Candida albicans NOT DETECTED NOT DETECTED Final   Candida auris NOT DETECTED NOT DETECTED Final   Candida glabrata NOT DETECTED NOT DETECTED Final   Candida krusei NOT DETECTED NOT DETECTED Final   Candida parapsilosis NOT DETECTED NOT DETECTED Final   Candida tropicalis NOT DETECTED NOT DETECTED Final   Cryptococcus neoformans/gattii NOT DETECTED NOT DETECTED  Final   Meth resistant mecA/C and MREJ NOT DETECTED NOT DETECTED Final    Comment: Performed at [facility], [address] [place], [place] [address]  CULTURE, BLOOD (ROUTINE X 2) w Reflex to ID Panel     Status: Abnormal   Collection Time: 09/06/20 11:42 AM   Specimen: BLOOD  Result Value Ref Range Status   Specimen Description   Final    BLOOD LEFT HAND Performed at [facility], [address] [place], [place] [address]    Special Requests   Final    BOTTLES DRAWN AEROBIC AND ANAEROBIC Blood Culture results may not be optimal due to an inadequate volume of blood received in culture bottles Performed at [facility], [address] [place], [place] [address]    Culture  Setup Time   Final    GRAM POSITIVE COCCI IN BOTH AEROBIC AND ANAEROBIC BOTTLES CRITICAL VALUE NOTED.  VALUE IS CONSISTENT WITH PREVIOUSLY REPORTED AND CALLED VALUE.  Performed at [facility], [address] [place], [place] [address]    Culture (A)  Final    STAPHYLOCOCCUS AUREUS SUSCEPTIBILITIES PERFORMED ON PREVIOUS CULTURE WITHIN THE LAST 5 DAYS. Performed at [facility], [place] [address] [place], [place] [address]    Report Status 09/09/2020 FINAL  Final  CULTURE, BLOOD (ROUTINE X 2) w Reflex to ID Panel     Status: None   Collection Time: 09/07/20 11:09 AM   Specimen: BLOOD  Result Value Ref Range Status   Specimen Description BLOOD LEFT ANTECUBITAL  Final   Special Requests   Final    BOTTLES DRAWN AEROBIC AND ANAEROBIC Blood Culture adequate volume   Culture   Final    NO GROWTH 5 DAYS Performed at [facility], [address] [place], [place] [address]    Report Status 09/12/2020 FINAL  Final  CULTURE, BLOOD (ROUTINE X 2) w Reflex to ID Panel     Status: None   Collection Time: 09/09/20  1:55 PM   Specimen: BLOOD  Result Value Ref Range Status   Specimen Description BLOOD BLOOD LEFT HAND  Final   Special Requests   Final    BOTTLES  DRAWN AEROBIC AND ANAEROBIC Blood Culture adequate volume   Culture   Final    NO GROWTH 5 DAYS Performed at [facility], [address] [place], [place] [address]    Report Status 09/14/2020 FINAL  Final  CULTURE, BLOOD (ROUTINE X 2) w Reflex to ID Panel     Status: None   Collection Time: 09/09/20  2:08 PM   Specimen: BLOOD  Result Value Ref Range Status   Specimen Description BLOOD LEFT ANTECUBITAL  Final   Special Requests   Final    BOTTLES DRAWN AEROBIC AND ANAEROBIC Blood Culture adequate volume   Culture   Final    NO GROWTH 5 DAYS Performed at [facility], [address], [place], [place] [address]    Report Status 09/14/2020 FINAL  Final      IMAGING   No results found.  Nutrition Status: Nutrition Problem: Increased nutrient needs Etiology: catabolic illness ([id number]-01) Signs/Symptoms: estimated needs Interventions: Refer to RD note for recommendations   Indwelling Urinary Catheter continued, requirement due to   Reason to continue Indwelling Urinary Catheter strict Intake/Output monitoring for hemodynamic instability         Ventilator continued, requirement due to severe respiratory failure   Ventilator Sedation RASS 0 to -2   ASSESSMENT AND PLAN SYNOPSIS 30 yo male recent hospitalization for perforated diverticulitis s/p colostomy with reversal and leakage of anastomosis with s/p revision now acutely hypoxemic with COVID19 induced severe ARDS.failure to wean from VENT-self extubation and trial of extubation has failed Severe encephalopathy leading to inability to protect airway  Severe ACUTE Hypoxic and Hypercapnic Respiratory Failure -continue Full MV support -continue Bronchodilator Therapy -Wean Fio2 and PEEP as tolerated -will perform SAT/SBT when respiratory parameters are met -VAP/VENT bundle implementation S/p [facility]  ACUTE DIASTOLIC CARDIAC FAILURE-  -oxygen as needed -Lasix as tolerated  Morbid obesity, possible  OSA.   Will certainly impact respiratory mechanics, ventilator weaning Suspect will need to consider additional PEEP S/p trach   NEUROLOGY Acute toxic metabolic encephalopathy, need for sedation Goal RASS -2 to -3  CARDIAC ICU monitoring -unable to do TEE   ID -continue IV abx as prescibed -follow up cultures -unable to get TEE probe past trach  GI GI PROPHYLAXIS as indicated Plan for PEG  tube today  DIET-->TF's as tolerated Constipation protocol as indicated  ENDO - will use ICU hypoglycemic\Hyperglycemia protocol if indicated   ELECTROLYTES -follow labs as needed -replace as needed -pharmacy consultation and following   DVT/GI PRX ordered and assessed TRANSFUSIONS AS NEEDED MONITOR FSBS I Assessed the need for Labs I Assessed the need for Foley I Assessed the need for Central Venous Line Family Discussion when available I Assessed the need for Mobilization I made an Assessment of medications to be adjusted accordingly Safety Risk assessment completed   CASE DISCUSSED IN MULTIDISCIPLINARY ROUNDS WITH ICU TEAM  Critical Care Time devoted to patient care services described in this note is 45  minutes.   Overall, patient is critically ill, prognosis is guarded.   Arcelia Jew MD Intensivist

## 2020-09-15 NOTE — Progress Notes (Signed)
Multiple attempts to get tee probe to pass behind the tracheostomy were unsuccessful. Will defer at present and consider reattempt early next week, possible with alternative operator. Discussed with daughter.

## 2020-09-15 NOTE — Progress Notes (Addendum)
[place] for Electrolyte Monitoring and Replacement   Recent Labs: Potassium (mmol/L)  Date Value  09/15/2020 4.0   Magnesium (mg/dL)  Date Value  09/15/2020 1.7   Calcium (mg/dL)  Date Value  09/15/2020 8.1 (L)   Albumin (g/dL)  Date Value  09/15/2020 1.7 (L)   Phosphorus (mg/dL)  Date Value  09/15/2020 2.6   Sodium (mmol/L)  Date Value  09/15/2020 142   Corrected Ca: 9.9 mg/dL  Assessment: 30 yo male admitted with perforated diverticulitis and severe acute hypoxemic respiratory failure secondary to COVID-19 infection. Patient has severe ARDs and was reintubated 1/21. Pharmacy has been consulted for electrolyte monitoring and replacement.   Patient is status post tracheostomy 1/25 and PEG 1/26. Nutrition provided via tube feeds.  Goal of Therapy:  Electrolytes WNL   Plan:  1/27 AM labs: Na 142, K 4, Phos 2.6, Mg 1.7  Provider ordered magnesium sulfate 2 g IV x 1  Recheck electrolytes in AM  Pharmacy will continue to follow and replace electrolytes as needed.   Benita Gutter 09/15/2020 9:05 AM

## 2020-09-15 NOTE — Progress Notes (Signed)
*  PRELIMINARY RESULTS* Echocardiogram Echocardiogram Transesophageal has been performed.  Sherrie Sport 09/15/2020, 9:52 AM

## 2020-09-15 NOTE — Progress Notes (Signed)
Nutrition Follow-up  DOCUMENTATION CODES:   Obesity unspecified  INTERVENTION:   Vital 1.5'@80ml' /hr- Initiate at 75m/hr and increase by 136mhr q 8 hours until goal rate is reached.   Pro-Source TF 4571mID via tube, provides 40kcal and 11g of protein per serving   Free water flushes 36m67m hours to maintain tube patency   Regimen provides 3000kcal/day, 163g/day protein and 1647ml42m free water.   NUTRITION DIAGNOSIS:   Increased nutrient needs related to catabolic illness ([id number] by estimated needs.  GOAL:   Provide needs based on ASPEN/SCCM guidelines -Not met at this time.  MONITOR:   Vent status,Labs,Weight trends,Skin,I & O's  ASSESSMENT:   29 yo92ale with h/o Hartmann's for perforated diverticulitis 11/22/19, s/p takedown, parastomal hernia removal and resection of 26cm small bowel 06/28/46/0/92licated by leakage of anastomosis s/p revision 07/08/20 now admitted with COVID19 induced severe ARDS. Pt developed worsening respiratory failure 12/23 requiring intubation   Pt s/p trach 1/25 and PEG tube 1/26  Pt s/p trach/PEG. Will resume tube feeds today; pt tolerating at goal rate. [facility]   Medications reviewed and include: allopurinol, lovenox, insulin, oxycodone, cefazolin, pepcid  Labs reviewed: K 4.0 wnl, creat <0.30(L), P 2.6 wnl, Mg 1.7 wnl Hgb 9.2(L), Hct 29.4(L) cbgs- 135, 134, 209 x 24 hrs  Patient is currently intubated on ventilator support MV: 10.3 L/min Temp (24hrs), Avg:100.1 F (37.8 C), Min:99.8 F (37.7 C), Max:100.7 F (38.2 C)  Propofol: none   MAP- >65mmH67mOP- 795ml  35m Order:   Diet Order            Diet NPO time specified  Diet effective now                EDUCATION NEEDS:   No education needs have been identified at this time  Skin:  Skin Assessment: Skin Integrity Issues: Skin Integrity Issues:: Other (Comment) Other: skin tear left back (1cm x 4cm)  Last BM:  1/27- type 6-  25ml vi32mtomy  Height:   Ht Readings from Last 1 Encounters:  09/08/20 6' 0.01" (1.829 m)   Weight:   Wt Readings from Last 1 Encounters:  09/15/20 115.3 kg   Ideal Body Weight:  80.9 kg  BMI:  Body mass index is 34.47 kg/m.  Estimated Nutritional Needs:   Kcal:  2800-3100kcal/day  Protein:  >160g/day  Fluid:  2.4-2.7L/day  Joe Tanney CaKoleen Distance LDN Please refer to AMION foA[facility] ([facility]) Hospitaland/or RD on-call/weekend/after hours pager

## 2020-09-16 DIAGNOSIS — R509 Fever, unspecified: Secondary | ICD-10-CM

## 2020-09-16 DIAGNOSIS — Z930 Tracheostomy status: Secondary | ICD-10-CM

## 2020-09-16 DIAGNOSIS — R7881 Bacteremia: Secondary | ICD-10-CM | POA: Diagnosis not present

## 2020-09-16 DIAGNOSIS — Z8719 Personal history of other diseases of the digestive system: Secondary | ICD-10-CM

## 2020-09-16 DIAGNOSIS — U071 COVID-19: Secondary | ICD-10-CM | POA: Diagnosis not present

## 2020-09-16 DIAGNOSIS — G9341 Metabolic encephalopathy: Secondary | ICD-10-CM | POA: Diagnosis not present

## 2020-09-16 DIAGNOSIS — Z931 Gastrostomy status: Secondary | ICD-10-CM

## 2020-09-16 DIAGNOSIS — J9601 Acute respiratory failure with hypoxia: Secondary | ICD-10-CM | POA: Diagnosis not present

## 2020-09-16 LAB — RENAL FUNCTION PANEL
Albumin: 1.5 g/dL — ABNORMAL LOW (ref 3.5–5.0)
Anion gap: 10 (ref 5–15)
BUN: 7 mg/dL (ref 6–20)
CO2: 33 mmol/L — ABNORMAL HIGH (ref 22–32)
Calcium: 7.6 mg/dL — ABNORMAL LOW (ref 8.9–10.3)
Chloride: 98 mmol/L (ref 98–111)
Creatinine, Ser: <0.3 mg/dL — ABNORMAL LOW (ref 0.61–1.24)
Glucose, Bld: 242 mg/dL — ABNORMAL HIGH (ref 70–99)
Phosphorus: 2.7 mg/dL (ref 2.5–4.6)
Potassium: 3.2 mmol/L — ABNORMAL LOW (ref 3.5–5.1)
Sodium: 141 mmol/L (ref 135–145)

## 2020-09-16 LAB — URINALYSIS, ROUTINE W REFLEX MICROSCOPIC
Bilirubin Urine: NEGATIVE
Glucose, UA: NEGATIVE mg/dL
Hgb urine dipstick: NEGATIVE
Ketones, ur: NEGATIVE mg/dL
Nitrite: NEGATIVE
Protein, ur: 100 mg/dL — AB
RBC / HPF: >50 RBC/hpf — ABNORMAL HIGH (ref 0–5)
Specific Gravity, Urine: 1.033 — ABNORMAL HIGH (ref 1.005–1.030)
Squamous Epithelial / HPF: NONE SEEN (ref 0–5)
WBC, UA: >50 WBC/hpf — ABNORMAL HIGH (ref 0–5)
pH: 5 (ref 5.0–8.0)

## 2020-09-16 LAB — MAGNESIUM
Magnesium: 1.1 mg/dL — ABNORMAL LOW (ref 1.7–2.4)
Magnesium: 1.5 mg/dL — ABNORMAL LOW (ref 1.7–2.4)

## 2020-09-16 LAB — CBC
HCT: 21.9 % — ABNORMAL LOW (ref 39.0–52.0)
Hemoglobin: 6.2 g/dL — ABNORMAL LOW (ref 13.0–17.0)
MCH: 27.8 pg (ref 26.0–34.0)
MCHC: 28.3 g/dL — ABNORMAL LOW (ref 30.0–36.0)
MCV: 98.2 fL (ref 80.0–100.0)
Platelets: 103 10*3/uL — ABNORMAL LOW (ref 150–400)
RBC: 2.23 MIL/uL — ABNORMAL LOW (ref 4.22–5.81)
RDW: 19.1 % — ABNORMAL HIGH (ref 11.5–15.5)
WBC: 6.3 10*3/uL (ref 4.0–10.5)
nRBC: 0.3 % — ABNORMAL HIGH (ref 0.0–0.2)

## 2020-09-16 LAB — GLUCOSE, CAPILLARY
Glucose-Capillary: 176 mg/dL — ABNORMAL HIGH (ref 70–99)
Glucose-Capillary: 187 mg/dL — ABNORMAL HIGH (ref 70–99)
Glucose-Capillary: 195 mg/dL — ABNORMAL HIGH (ref 70–99)
Glucose-Capillary: 201 mg/dL — ABNORMAL HIGH (ref 70–99)
Glucose-Capillary: 206 mg/dL — ABNORMAL HIGH (ref 70–99)
Glucose-Capillary: 208 mg/dL — ABNORMAL HIGH (ref 70–99)

## 2020-09-16 LAB — HEMOGLOBIN: Hemoglobin: 7.5 g/dL — ABNORMAL LOW (ref 13.0–17.0)

## 2020-09-16 MED ORDER — SODIUM CHLORIDE 0.9% IV SOLUTION: Freq: Once | INTRAVENOUS | Status: DC

## 2020-09-16 MED ORDER — POTASSIUM CHLORIDE 20 MEQ PO PACK
40.0000 meq | PACK | ORAL | Status: AC
Start: 2020-09-16 — End: 2020-09-16
  Administered 2020-09-16 (×2): 40 meq
  Filled 2020-09-16 (×2): qty 2

## 2020-09-16 MED ORDER — MAGNESIUM SULFATE 4 GM/100ML IV SOLN
4.0000 g | Freq: Once | INTRAVENOUS | Status: AC
  Administered 2020-09-16: 4 g via INTRAVENOUS
  Filled 2020-09-16: qty 100

## 2020-09-16 MED ORDER — OXYCODONE HCL 5 MG PO TABS
10.0000 mg | ORAL_TABLET | Freq: Four times a day (QID) | ORAL | Status: DC
  Administered 2020-09-16 – 2020-09-24 (×33): 10 mg
  Filled 2020-09-16 (×33): qty 2

## 2020-09-16 MED ORDER — PANTOPRAZOLE SODIUM 40 MG IV SOLR
40.0000 mg | Freq: Two times a day (BID) | INTRAVENOUS | Status: DC
  Administered 2020-09-16 – 2020-09-24 (×16): 40 mg via INTRAVENOUS
  Filled 2020-09-16 (×18): qty 40

## 2020-09-16 MED ORDER — CLONAZEPAM 0.5 MG PO TABS
0.5000 mg | ORAL_TABLET | Freq: Two times a day (BID) | ORAL | Status: DC
  Administered 2020-09-16 – 2020-09-20 (×9): 0.5 mg
  Filled 2020-09-16 (×9): qty 1

## 2020-09-16 NOTE — Progress Notes (Signed)
Date of Admission:  08/09/2020      Unable to give any history Sedated Trachestomy ventilated                               ID: Craig [name] is a 30 y.o. male  Principal Problem:   Acute hypoxemic respiratory failure due to COVID-19 [facility]) Active Problems:   Severe sepsis ([place])   AKI (acute kidney injury) ([place])   Hypertension   Fever   Metabolic encephalopathy   Staphylococcus aureus bacteremia     Antibiotic history Ceftriaxone 12/21>>12/26 Azithromycin 12/21>>12/23 Zosyn 12/27>>12/31 Vanco 12/27-1 dose Linezolid 12/28>>12/31 08/20/2020>>09/05/20 no antibiotics 1/18 one dose of cefepime 1/18 cefazolin>>  Micro 12/21 BC-NG 12/21 UC-NG 12/27 BC NG 1/18 BC- MSSA 1/18 Resp culture MSSA 09/16/20- BC  09/16/20 Resp culture  LDA 08/13/20>>09/13/20- ETT Tracheostomy 09/10/20 08/27/20>>09/07/20 PICC-rt 08/25/20 Foley PICC 09/13/20 PEG 09/14/20 Colostomy     On examination Patient Vitals for the past 24 hrs:  BP Temp Temp src Pulse Resp SpO2  09/16/20 1214 - - - - - 92 %  09/16/20 1200 - - - (!) 114 (!) 25 93 %  09/16/20 1100 (!) 121/58 99.2 F (37.3 C) Axillary (!) 116 (!) 24 95 %  09/16/20 1000 (!) 116/56 - - (!) 113 (!) 24 94 %  09/16/20 0900 (!) 110/59 - - (!) 115 (!) 24 93 %  09/16/20 0800 (!) 108/55 100.2 F (37.9 C) Axillary (!) 113 (!) 24 96 %  09/16/20 0759 - - - - - 96 %  09/16/20 0700 (!) 104/57 - - (!) 112 (!) 24 91 %  09/16/20 0600 (!) 115/57 - - (!) 121 (!) 24 96 %  09/16/20 0500 (!) 117/58 - - (!) 123 (!) 24 91 %  09/16/20 0400 (!) 113/58 (!) 100.9 F (38.3 C) Temporal (!) 124 (!) 24 94 %  09/16/20 0300 (!) 123/56 - - (!) 125 (!) 24 92 %  09/16/20 0258 - - - - - 94 %  09/16/20 0200 (!) 116/54 - - (!) 121 (!) 26 94 %  09/16/20 0100 123/61 - - (!) 118 (!) 23 95 %  09/16/20 0000 (!) 124/54 99.7 F (37.6 C) Axillary (!) 119 (!) 22 94 %  09/15/20 2300 125/63 - - (!) 116 (!) 26 96 %  09/15/20 2200 111/63 - - (!) 112 (!) 24 97 %  09/15/20  2100 105/66 - - (!) 107 (!) 24 98 %  09/15/20 2054 - - - - - 98 %  09/15/20 2000 (!) 105/58 99.9 F (37.7 C) Axillary (!) 106 (!) 25 96 %  09/15/20 1900 115/62 - - (!) 108 (!) 24 96 %  09/15/20 1800 120/68 - - (!) 110 (!) 21 96 %  09/15/20 1700 (!) 100/56 - - (!) 108 (!) 24 98 %  09/15/20 1630 (!) 96/59 - - (!) 110 (!) 24 97 %  09/15/20 1600 (!) 90/54 100.3 F (37.9 C) Oral (!) 112 (!) 24 96 %  09/15/20 1500 116/74 - - (!) 117 (!) 24 94 %  09/15/20 1430 117/65 - - (!) 116 (!) 24 90 %  09/15/20 1400 118/60 - - (!) 115 (!) 24 92 %   Patient has a tracheostomy Some purulence around it On Iv fentanyl Chest bilateral air entry Heart sounds S1-S2 PEG in place- site clean PICC line Foley catheter Colostomy   CBC Latest Ref Rng & Units 09/12/2020 09/11/2020 09/10/2020  WBC 4.0 - 10.5 K/uL 6.8 8.9 9.9  Hemoglobin 13.0 - 17.0 g/dL 9.2(L) 10.3(L) 10.1(L)  Hematocrit 39.0 - 52.0 % 29.4(L) 32.8(L) 31.9(L)  Platelets 150 - 400 K/uL 141(L) 158 126(L)    CMP Latest Ref Rng & Units 09/16/2020 09/15/2020 09/14/2020  Glucose 70 - 99 mg/dL 242(H) 164(H) 212(H)  BUN 6 - 20 mg/dL 7 7 <5(L)  Creatinine 0.61 - 1.24 mg/dL <0.30(L) <0.30(L) <0.30(L)  Sodium 135 - 145 mmol/L 141 142 142  Potassium 3.5 - 5.1 mmol/L 3.2(L) 4.0 4.1  Chloride 98 - 111 mmol/L 98 101 104  CO2 22 - 32 mmol/L 33(H) 31 30  Calcium 8.9 - 10.3 mg/dL 7.6(L) 8.1(L) 8.0(L)  Total Protein 6.5 - 8.1 g/dL - - -  Total Bilirubin 0.3 - 1.2 mg/dL - - -  Alkaline Phos 38 - 126 U/L - - -  AST 15 - 41 U/L - - -  ALT 0 - 44 U/L - - -    Impression/recommendation  Acute hypoxic respiratory failure due to Covid. Now has tracheostomy.  PEG in place for nutrition  MSSA bacteremia and MSSA multifocal pneumonia. Repeat cultures have been negative 2D echo was a poor study. As this patient had PICC line which had been removed but because of MSSA will need to get a TEE to rule out endocarditis. Cardiology attempted TEE yesterday but could not  get the probe down behind the tracheostomy. They will try again next week Continue cefazolin  New onset low-grade fever Would recommend removing the Foley catheter which has been in place since 08/25/2020.and placing external catheter Also remove extra lines from the arms Sent a culture from the tracheostomy site  Complicated diverticulitis history. Had a perforated diverticulitis in April 2021 leading to urgent colectomy and ostomy. He had a takedown of the ostomy in November 7371 which was complicated by dehiscence needing partial colectomy and colostomy on 07/08/2020.  Anemia  Discussed the management with his nurse and intensivist ID will follow him peripherally this weekend call if needed.

## 2020-09-16 NOTE — Progress Notes (Signed)
Ostomy put out 100 overnight. 40 Lasix given w/ positive effect (1.3L out). Pt temp 38.3 this AM, PRN tylenol given. Pt remains on Versed and Fentanyl. Oozing continues around trach site, trach care and canula completed w/ RT.

## 2020-09-16 NOTE — TOC Progression Note (Signed)
Transition of Care [facility]) - Progression Note    Patient Details  Name: DAELYN MOZER MRN: [id number] Date of Birth: 07-12-1991  Transition of Care [facility]) CM/SW [place], [place] Phone Number: [phone] 09/16/2020, 11:55 AM  Clinical Narrative:     CSW spoke with patient's [place],[name] (Spouse) [phone], with update on [facility] placement.  CSW explained we ar still waiting on insurance authorization, and I would update her when I received a confirmation from [place].  Ms. Algernon Huxley verbalized understanding.                 Expected Discharge Plan and Services                                                 Social Determinants of Health (SDOH) Interventions    Readmission Risk Interventions No flowsheet data found.

## 2020-09-16 NOTE — Progress Notes (Addendum)
CRITICAL CARE NOTE  30 yo male recent hospitalization for perforated diverticulitis s/p colostomy with reversal and leakage of anastomosis with s/p revision now acutely hypoxemic with COVID19 induced severe ARDS.     INTERVAL EVENTS: 08/11/20-patient continued to decline with worsening respiratory status despite 100%Fio2 on BIPAP and HFNC failure.  He stated that he feels he is dying and cannot breathe.  I discussed with wife [name] earlier today that patient is critically ill may need ETT, she is agreeable and thankful for care. I specifically discussed and explained intubation and mechanical ventilation to patient and he wishes to proceed.   08/12/20- patient was weaned on FiO2 to 60%. Spoke to wife [name].  08/13/20- Patient weaned to 40%.  During SBT today he self extubated and was placed on HFNC.  He subsequently became hypoxemic, disoriented aggitated, removed PIVs and colostomy and proceeded to have combative behavior with worsening oxygenation. I have attempted to calm patient and encouraged him to cooperate.  After numerous attempts patient continued to be combative with severe aggitation with combative behavior and would not wear oxygen. Patient was placed on sedation and MV.  Wife updated.   08/14/20- patient remains critically ill.  Weaned from 100%>>55%. Called and updated wife [name] today 12/27-Patient with worsening oxygenation on FiO2 100% peep of 14 08/16/20-Patient with continued high oxygen requirements overnight. Have spoken with surgery and there should be no problem proning patient with this. Was febrile yesterday and thus broad spectrum abx started and cultures attained.  08-17-20-patient down to PEEP 10 FiO2 55%.  Patient never required proning 08/18/20: FiO2 weaned to 40%, PEEP 10, plan for diuresis with Diamox today, adding PO Clonazepam to assist weaning sedation 08/19/20: Vent changed to Pressure Control this morning: 50% FiO2, 24, 22/5, Plan for Recruitment maneuvers  and diuresis 08/20/2020: Continued issues with hypercapnia due to the dead space ventilation.  Worsening despite pressure control.  Switch back to [facility], prone positioning 08/23/19- patient proned today without incident, continue to wean FiO2 , currently on 50% 08/24/19- patient weaned to 40% FiO2, plan to continue proning with full scope of care.  I have spoken to [name] (wife of patient today) she would like to be present prior to next extubation attempt to help console patient and decrease his aggitation.  1/5 failed weaning trials, ver agitated , severe hypoxia, Wife at bedside witnessed failed SAT. 1/6 severe resp failure, hypoxia 1/7 proning for 16 hrs, severe ARDS 1/8 PICC line placed, Had a brief PEA arrest while prone. Proning discontinued 1/10 severe resp failure 1/11 severe ARDS, ENT consulted fro [facility] 1/12 severe resp failure 09/02/20- patient with no acute events.  Spoke with [name] today answered questions and plan is for trache.  09/03/20- patient remains on MV. FiO2 down to 35%PRVC plan for trache. 09/04/20- patient with TG>1000 will switch off propofol but patient with very high sedation requirement, will discuss options with pharmD.  Plan for trache.  Met with wife [name] at bedside.  1/20 extubated, wife at bedside, very weak cough, encephalopathy 1/21 re-intubated, severe resp failure 1/23 needs [facility] for survival 1/24 needs trach, severe resp failure 1/25 s/p [facility] 1/27 unable to do TEE today 1/28 TEE deferred; new anemia (possibly related to recent tracheostomy)   CC  follow up respiratory failure  SUBJECTIVE Patient remains sedated and mechanically ventilated on minimal vent settings after tracheostomy. Febrile overnight to 100.9 F. Per protocol, trach weaning trials have not yet begun. Hemoglobin drop noted today from 9.2 to 6.2. Last CBC check on  1/23. No signs of active bleeding. Stool output from ostomy without blood. Hemodynamics are stable.  BP (!) 108/55 (BP  Location: Left Arm)   Pulse (!) 113   Temp 100.2 F (37.9 C) (Axillary)   Resp (!) 24   Ht 6' 0.01" (1.829 m)   Wt 115.3 kg   SpO2 96%   BMI 34.47 kg/m   I/O last 3 completed shifts: In: 1409.5 [I.V.:825.5; Other:40; IV Piggyback:544] Out: 6644 [Urine:2885; Drains:40; Stool:350] Total I/O In: -  Out: 45 [Urine:45]  SpO2: 96 % O2 Flow Rate (L/min): 0 L/min FiO2 (%): 35 %  Estimated body mass index is 34.47 kg/m as calculated from the following:   Height as of this encounter: 6' 0.01" (1.829 m).   Weight as of this encounter: 115.3 kg.   REVIEW OF SYSTEMS PATIENT IS UNABLE TO PROVIDE COMPLETE REVIEW OF SYSTEMS DUE TO SEVERE CRITICAL ILLNESS    PHYSICAL EXAMINATION:  GENERAL: young male in NAD, sedated HEAD: Normocephalic, atraumatic.  EYES: Pupils equal, round, reactive to light.  No scleral icterus.  MOUTH: Moist mucosal membrane. NECK: Supple. S/p trach PULMONARY: CTA b/l, no W/C/R CARDIOVASCULAR: Tachycardic. No murmurs, rubs, or gallops.  GASTROINTESTINAL: Soft, nontender, -distended.  Positive bowel sounds.   MUSCULOSKELETAL: +edema.  NEUROLOGIC: obtunded, GCS<8 SKIN:intact,warm,dry  MEDICATIONS: I have reviewed all medications and confirmed regimen as documented  CULTURE RESULTS   Recent Results (from the past 240 hour(s))  CULTURE, BLOOD (ROUTINE X 2) w Reflex to ID Panel     Status: Abnormal   Collection Time: 09/06/20  9:35 AM   Specimen: BLOOD  Result Value Ref Range Status   Specimen Description   Final    BLOOD LINE Performed at [facility], [address], [place], [place] [address]    Special Requests   Final    BOTTLES DRAWN AEROBIC AND ANAEROBIC Blood Culture adequate volume Performed at [facility], [address] [place], [place] [address]    Culture  Setup Time   Final    GRAM POSITIVE COCCI IN BOTH AEROBIC AND ANAEROBIC BOTTLES CRITICAL RESULT CALLED TO, READ BACK BY AND VERIFIED WITH: JASON ROBINS '@[id number]'  ON  09/16/20 SKL Performed at [facility], [place] [address], [place], [place] [address]    Culture STAPHYLOCOCCUS AUREUS (A)  Final   Report Status 09/09/2020 FINAL  Final   Organism ID, Bacteria STAPHYLOCOCCUS AUREUS  Final      Susceptibility   Staphylococcus aureus - MIC*    CIPROFLOXACIN <=0.5 SENSITIVE Sensitive     ERYTHROMYCIN RESISTANT Resistant     GENTAMICIN <=0.5 SENSITIVE Sensitive     OXACILLIN 0.5 SENSITIVE Sensitive     TETRACYCLINE <=1 SENSITIVE Sensitive     VANCOMYCIN 1 SENSITIVE Sensitive     TRIMETH/SULFA <=10 SENSITIVE Sensitive     CLINDAMYCIN RESISTANT Resistant     RIFAMPIN <=0.5 SENSITIVE Sensitive     Inducible Clindamycin POSITIVE Resistant     * STAPHYLOCOCCUS AUREUS  Blood Culture ID Panel (Reflexed)     Status: Abnormal   Collection Time: 09/06/20  9:35 AM  Result Value Ref Range Status   Enterococcus faecalis NOT DETECTED NOT DETECTED Final   Enterococcus Faecium NOT DETECTED NOT DETECTED Final   Listeria monocytogenes NOT DETECTED NOT DETECTED Final   Staphylococcus species DETECTED (A) NOT DETECTED Final    Comment: CRITICAL RESULT CALLED TO, READ BACK BY AND VERIFIED WITH: JASON ROBINS '@[id number]'  ON 09/06/20 SKL    Staphylococcus aureus (BCID) DETECTED (A)  NOT DETECTED Final    Comment: CRITICAL RESULT CALLED TO, READ BACK BY AND VERIFIED WITH: JASON ROBINS '@[id number]'  ON 09/06/20 SKL    Staphylococcus epidermidis NOT DETECTED NOT DETECTED Final   Staphylococcus lugdunensis NOT DETECTED NOT DETECTED Final   Streptococcus species NOT DETECTED NOT DETECTED Final   Streptococcus agalactiae NOT DETECTED NOT DETECTED Final   Streptococcus pneumoniae NOT DETECTED NOT DETECTED Final   Streptococcus pyogenes NOT DETECTED NOT DETECTED Final   A.calcoaceticus-baumannii NOT DETECTED NOT DETECTED Final   Bacteroides fragilis NOT DETECTED NOT DETECTED Final   Enterobacterales NOT DETECTED NOT DETECTED Final   Enterobacter cloacae complex NOT DETECTED NOT DETECTED  Final   Escherichia coli NOT DETECTED NOT DETECTED Final   Klebsiella aerogenes NOT DETECTED NOT DETECTED Final   Klebsiella oxytoca NOT DETECTED NOT DETECTED Final   Klebsiella pneumoniae NOT DETECTED NOT DETECTED Final   Proteus species NOT DETECTED NOT DETECTED Final   Salmonella species NOT DETECTED NOT DETECTED Final   Serratia marcescens NOT DETECTED NOT DETECTED Final   Haemophilus influenzae NOT DETECTED NOT DETECTED Final   Neisseria meningitidis NOT DETECTED NOT DETECTED Final   Pseudomonas aeruginosa NOT DETECTED NOT DETECTED Final   Stenotrophomonas maltophilia NOT DETECTED NOT DETECTED Final   Candida albicans NOT DETECTED NOT DETECTED Final   Candida auris NOT DETECTED NOT DETECTED Final   Candida glabrata NOT DETECTED NOT DETECTED Final   Candida krusei NOT DETECTED NOT DETECTED Final   Candida parapsilosis NOT DETECTED NOT DETECTED Final   Candida tropicalis NOT DETECTED NOT DETECTED Final   Cryptococcus neoformans/gattii NOT DETECTED NOT DETECTED Final   Meth resistant mecA/C and MREJ NOT DETECTED NOT DETECTED Final    Comment: Performed at [facility], [address] [place], [place] [address]  CULTURE, BLOOD (ROUTINE X 2) w Reflex to ID Panel     Status: Abnormal   Collection Time: 09/06/20 11:42 AM   Specimen: BLOOD  Result Value Ref Range Status   Specimen Description   Final    BLOOD LEFT HAND Performed at [facility], [address] [place], [place] [address]    Special Requests   Final    BOTTLES DRAWN AEROBIC AND ANAEROBIC Blood Culture results may not be optimal due to an inadequate volume of blood received in culture bottles Performed at [facility], [address] [place], [place] [address]    Culture  Setup Time   Final    GRAM POSITIVE COCCI IN BOTH AEROBIC AND ANAEROBIC BOTTLES CRITICAL VALUE NOTED.  VALUE IS CONSISTENT WITH PREVIOUSLY REPORTED AND CALLED VALUE. Performed at [facility], [address] [place], [place] [address]    Culture (A)  Final    STAPHYLOCOCCUS AUREUS SUSCEPTIBILITIES PERFORMED ON PREVIOUS CULTURE WITHIN THE LAST 5 DAYS. Performed at [facility], [place] [address], [place], [place] [address]    Report Status 09/09/2020 FINAL  Final  CULTURE, BLOOD (ROUTINE X 2) w Reflex to ID Panel     Status: None   Collection Time: 09/07/20 11:09 AM   Specimen: BLOOD  Result Value Ref Range Status   Specimen Description BLOOD LEFT ANTECUBITAL  Final   Special Requests   Final    BOTTLES DRAWN AEROBIC AND ANAEROBIC Blood Culture adequate volume   Culture   Final    NO GROWTH 5 DAYS Performed at [facility], [address], [place], [place] [address]    Report Status 09/12/2020 FINAL  Final  CULTURE, BLOOD (  ROUTINE X 2) w Reflex to ID Panel     Status: None   Collection Time: 09/09/20  1:55 PM   Specimen: BLOOD  Result Value Ref Range Status   Specimen Description BLOOD BLOOD LEFT HAND  Final   Special Requests   Final    BOTTLES DRAWN AEROBIC AND ANAEROBIC Blood Culture adequate volume   Culture   Final    NO GROWTH 5 DAYS Performed at [facility], [address] [place], [place] [address]    Report Status 09/14/2020 FINAL  Final  CULTURE, BLOOD (ROUTINE X 2) w Reflex to ID Panel     Status: None   Collection Time: 09/09/20  2:08 PM   Specimen: BLOOD  Result Value Ref Range Status   Specimen Description BLOOD LEFT ANTECUBITAL  Final   Special Requests   Final    BOTTLES DRAWN AEROBIC AND ANAEROBIC Blood Culture adequate volume   Culture   Final    NO GROWTH 5 DAYS Performed at [facility], [address] [place], [place] [address]    Report Status 09/14/2020 FINAL  Final      IMAGING   No results found.  Nutrition Status: Nutrition Problem: Increased nutrient needs Etiology: catabolic illness ([id number]-[id number]) Signs/Symptoms: estimated needs Interventions: Refer to RD note for recommendations   Indwelling Urinary  Catheter Discontinue Foley     Ventilator continued, requirement due to severe respiratory failure   Ventilator Sedation RASS 0   ASSESSMENT AND PLAN SYNOPSIS 30 yo male recent hospitalization for perforated diverticulitis s/p colostomy with reversal and leakage of anastomosis with s/p revision now acutely hypoxemic with COVID19 induced severe ARDS. failure to wean from VENT-self extubation and trial of extubation failed due to Severe encephalopathy leading to inability to protect airway. Tracheostomy performed 1/25. Plan for transfer to [facility] when bed is available.  PULMONARY Severe ACUTE Hypoxic and Hypercapnic Respiratory Failure Covid-19 pneumonia MSSA pneumonia Morbid obesity, possible OSA.  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 -Unable to do TEE, deferred for now -May be reasonable to treat empirically for 6 weeks for IE given poor windows and severity of illness  ID MSSA BACTEREMIA MSSA PNEUMONIA -ID following, continue Ancef -Obtain new culture data given new fever -Discontinue Foley catheter  HEME ANEMIA Hgb 6.2 today, down from 9.2 on 1/23 This may simply reflect intraop blood loss given hemodynamic stability and lack of evident bleeding Will transfuse 1 U PRBC and follow up repeat CBC  GI PEG in place, continue TF at goal rate  DIET SEVERE MALNURITION Continue TF via PEG Nutrition following Constipation protocol as indicated  ENDO - will use ICU hypoglycemic\Hyperglycemia protocol if indicated   ELECTROLYTES -follow labs as needed -replace as needed -pharmacy consultation and following   DVT/GI PRX ordered and  assessed TRANSFUSIONS AS NEEDED MONITOR FSBS I Assessed the need for Labs I Assessed the need for Foley I Assessed the need for Central Venous Line Family Discussion when available I Assessed the need for Mobilization I made an Assessment of medications to be adjusted accordingly Safety Risk assessment completed   CASE DISCUSSED IN MULTIDISCIPLINARY ROUNDS WITH ICU TEAM  Critical Care Time devoted to patient care services described in this note is 50 minutes.  Overall, patient is critically ill, prognosis is guarded.   Bennie Pierini, MD 09/16/20 4:22 PM

## 2020-09-16 NOTE — Progress Notes (Signed)
[name] for Electrolyte Monitoring and Replacement   Recent Labs: Potassium (mmol/L)  Date Value  09/16/2020 3.2 (L)   Magnesium (mg/dL)  Date Value  09/16/2020 1.5 (L)   Calcium (mg/dL)  Date Value  09/16/2020 7.6 (L)   Albumin (g/dL)  Date Value  09/16/2020 1.5 (L)   Phosphorus (mg/dL)  Date Value  09/16/2020 2.7   Sodium (mmol/L)  Date Value  09/16/2020 141   Corrected Ca: 9.6 mg/dL  Assessment: 30 yo male admitted with perforated diverticulitis and severe acute hypoxemic respiratory failure secondary to COVID-19 infection. Patient has severe ARDs and was reintubated 1/21. Pharmacy has been consulted for electrolyte monitoring and replacement.   Patient is status post tracheostomy 1/25 and PEG 1/26. Nutrition provided via tube feeds. Patient is receiving intermittent diuresis.   Goal of Therapy:  Electrolytes WNL   Plan:  1/28 AM labs: Na 141, K 3.2, Phos 2.7, Mg 1.5  KCl 40 mEq per tube q4h x 2  Magnesium sulfate 4 g IV x 1  Recheck electrolytes in AM  Pharmacy will continue to follow and replace electrolytes as needed.   Benita Gutter 09/16/2020 12:23 PM

## 2020-09-17 ENCOUNTER — Inpatient Hospital Stay: Payer: No Typology Code available for payment source

## 2020-09-17 DIAGNOSIS — R000 Tachycardia, unspecified: Secondary | ICD-10-CM

## 2020-09-17 DIAGNOSIS — R509 Fever, unspecified: Secondary | ICD-10-CM | POA: Diagnosis not present

## 2020-09-17 DIAGNOSIS — J9621 Acute and chronic respiratory failure with hypoxia: Secondary | ICD-10-CM | POA: Diagnosis not present

## 2020-09-17 DIAGNOSIS — J9622 Acute and chronic respiratory failure with hypercapnia: Secondary | ICD-10-CM

## 2020-09-17 DIAGNOSIS — U071 COVID-19: Secondary | ICD-10-CM | POA: Diagnosis not present

## 2020-09-17 DIAGNOSIS — G9341 Metabolic encephalopathy: Secondary | ICD-10-CM | POA: Diagnosis not present

## 2020-09-17 LAB — GLUCOSE, CAPILLARY
Glucose-Capillary: 182 mg/dL — ABNORMAL HIGH (ref 70–99)
Glucose-Capillary: 194 mg/dL — ABNORMAL HIGH (ref 70–99)
Glucose-Capillary: 205 mg/dL — ABNORMAL HIGH (ref 70–99)
Glucose-Capillary: 206 mg/dL — ABNORMAL HIGH (ref 70–99)
Glucose-Capillary: 212 mg/dL — ABNORMAL HIGH (ref 70–99)
Glucose-Capillary: 225 mg/dL — ABNORMAL HIGH (ref 70–99)

## 2020-09-17 LAB — CBC
HCT: 24.2 % — ABNORMAL LOW (ref 39.0–52.0)
Hemoglobin: 7.2 g/dL — ABNORMAL LOW (ref 13.0–17.0)
MCH: 27.9 pg (ref 26.0–34.0)
MCHC: 29.8 g/dL — ABNORMAL LOW (ref 30.0–36.0)
MCV: 93.8 fL (ref 80.0–100.0)
Platelets: 115 K/uL — ABNORMAL LOW (ref 150–400)
RBC: 2.58 MIL/uL — ABNORMAL LOW (ref 4.22–5.81)
RDW: 18.1 % — ABNORMAL HIGH (ref 11.5–15.5)
WBC: 7.3 K/uL (ref 4.0–10.5)
nRBC: 0 % (ref 0.0–0.2)

## 2020-09-17 LAB — MAGNESIUM: Magnesium: 1.6 mg/dL — ABNORMAL LOW (ref 1.7–2.4)

## 2020-09-17 LAB — RENAL FUNCTION PANEL
Albumin: 1.4 g/dL — ABNORMAL LOW (ref 3.5–5.0)
Anion gap: 8 (ref 5–15)
BUN: 8 mg/dL (ref 6–20)
CO2: 34 mmol/L — ABNORMAL HIGH (ref 22–32)
Calcium: 7.6 mg/dL — ABNORMAL LOW (ref 8.9–10.3)
Chloride: 98 mmol/L (ref 98–111)
Creatinine, Ser: <0.3 mg/dL — ABNORMAL LOW (ref 0.61–1.24)
Glucose, Bld: 191 mg/dL — ABNORMAL HIGH (ref 70–99)
Phosphorus: 2.7 mg/dL (ref 2.5–4.6)
Potassium: 4.1 mmol/L (ref 3.5–5.1)
Sodium: 140 mmol/L (ref 135–145)

## 2020-09-17 IMAGING — DX DG CHEST 1V PORT
1 series · 1 of 1 positions shown · non-contrast
Comparison: Eight days ago

CLINICAL DATA: Acute respiratory failure

EXAM:
PORTABLE CHEST 1 VIEW

[chest ap]
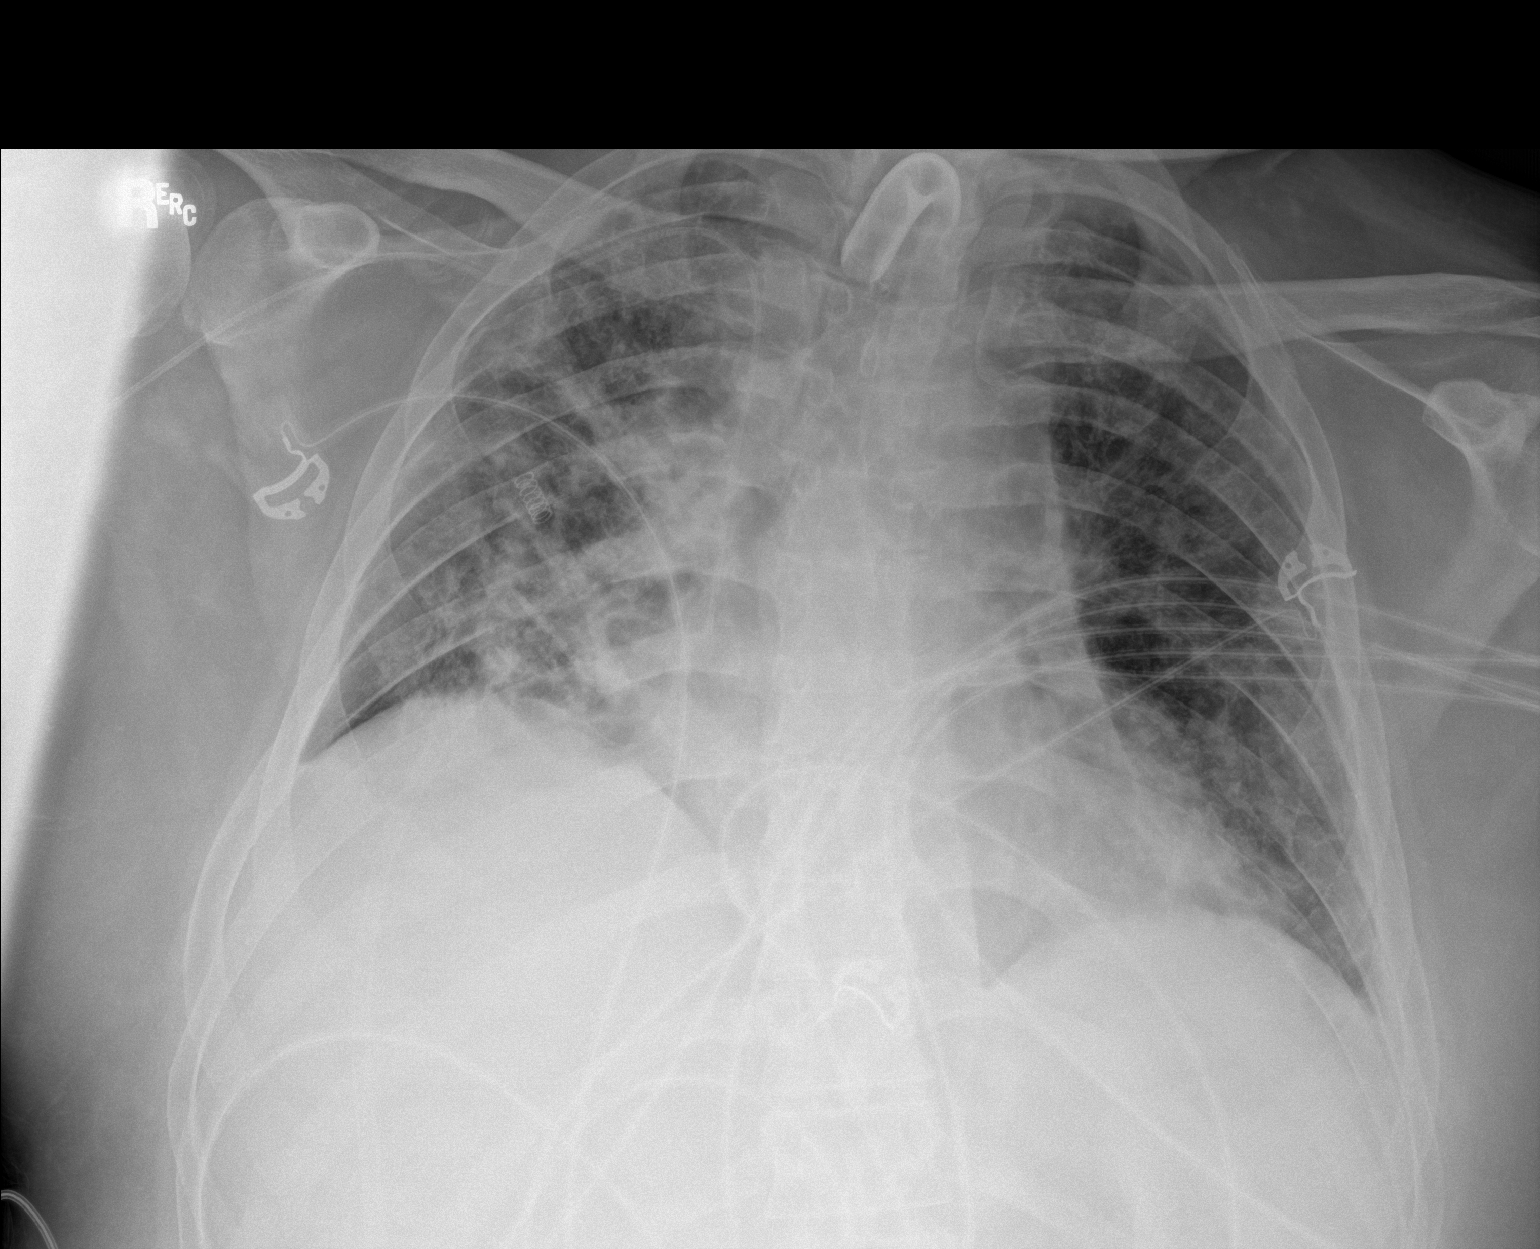

[1 of 1 positions shown; findings below may reference images not displayed]

FINDINGS: Tracheostomy tube since prior. Right PICC with tip at the upper
cavoatrial junction. Unchanged multifocal pneumonia. Generous heart
size which is stable. No visible effusion or pneumothorax. Artifact
from EKG leads.
IMPRESSION: 1. Stable pulmonary infiltrates.
2. Unremarkable hardware.

## 2020-09-17 MED ORDER — IBUPROFEN 600 MG PO TABS
600.0000 mg | ORAL_TABLET | Freq: Once | ORAL | Status: AC
  Administered 2020-09-17: 600 mg
  Filled 2020-09-17: qty 1

## 2020-09-17 MED ORDER — METOPROLOL TARTRATE 5 MG/5ML IV SOLN
5.0000 mg | Freq: Once | INTRAVENOUS | Status: AC
  Administered 2020-09-17: 5 mg via INTRAVENOUS
  Filled 2020-09-17: qty 5

## 2020-09-17 MED ORDER — HYDROMORPHONE HCL 1 MG/ML IJ SOLN
0.5000 mg | Freq: Once | INTRAMUSCULAR | Status: AC
  Administered 2020-09-17: 0.5 mg via INTRAVENOUS
  Filled 2020-09-17: qty 1

## 2020-09-17 MED ORDER — MAGNESIUM SULFATE 4 GM/100ML IV SOLN
4.0000 g | Freq: Once | INTRAVENOUS | Status: AC
  Administered 2020-09-17: 4 g via INTRAVENOUS
  Filled 2020-09-17: qty 100

## 2020-09-17 MED ORDER — ALBUMIN HUMAN 25 % IV SOLN
25.0000 g | Freq: Once | INTRAVENOUS | Status: AC
  Administered 2020-09-17: 25 g via INTRAVENOUS
  Filled 2020-09-17: qty 100

## 2020-09-17 MED ORDER — METOPROLOL TARTRATE 5 MG/5ML IV SOLN
5.0000 mg | Freq: Once | INTRAVENOUS | Status: AC
  Administered 2020-09-18: 5 mg via INTRAVENOUS
  Filled 2020-09-17: qty 5

## 2020-09-17 MED ORDER — METOPROLOL TARTRATE 5 MG/5ML IV SOLN
2.5000 mg | Freq: Once | INTRAVENOUS | Status: AC
  Administered 2020-09-17: 2.5 mg via INTRAVENOUS
  Filled 2020-09-17: qty 5

## 2020-09-17 MED ORDER — METOPROLOL TARTRATE 5 MG/5ML IV SOLN
2.5000 mg | Freq: Once | INTRAVENOUS | Status: AC
  Administered 2020-09-17: 2.5 mg via INTRAVENOUS

## 2020-09-17 MED ORDER — TAMSULOSIN HCL 0.4 MG PO CAPS: 0.4000 mg | ORAL_CAPSULE | Freq: Every day | ORAL | Status: DC

## 2020-09-17 NOTE — Progress Notes (Signed)
[place] for Electrolyte Monitoring and Replacement   Recent Labs: Potassium (mmol/L)  Date Value  09/17/2020 4.1   Magnesium (mg/dL)  Date Value  09/17/2020 1.6 (L)   Calcium (mg/dL)  Date Value  09/17/2020 7.6 (L)   Albumin (g/dL)  Date Value  09/17/2020 1.4 (L)   Phosphorus (mg/dL)  Date Value  09/17/2020 2.7   Sodium (mmol/L)  Date Value  09/17/2020 140   Corrected Ca: 9.6 mg/dL  Assessment: 30 yo male admitted with perforated diverticulitis and severe acute hypoxemic respiratory failure secondary to COVID-19 infection. Patient has severe ARDs and was reintubated 1/21. Pharmacy has been consulted for electrolyte monitoring and replacement.   Patient is status post tracheostomy 1/25 and PEG 1/26. Nutrition provided via tube feeds. Patient is receiving intermittent diuresis.   Goal of Therapy:  Electrolytes WNL   Plan:  Magnesium 4 g IV x 1 given this morning. No further replacement indicated at this time.  Pharmacy will continue to follow and replace electrolytes as needed.   Tawnya Crook, PharmD 09/17/2020 7:47 AM

## 2020-09-17 NOTE — Progress Notes (Signed)
CRITICAL CARE NOTE  30 yo male recent hospitalization for perforated diverticulitis s/p colostomy with reversal and leakage of anastomosis with s/p revision now acutely hypoxemic with COVID19 induced severe ARDS.     INTERVAL EVENTS: 08/11/20-patient continued to decline with worsening respiratory status despite 100%Fio2 on BIPAP and HFNC failure.  He stated that he feels he is dying and cannot breathe.  I discussed with wife [name] earlier today that patient is critically ill may need ETT, she is agreeable and thankful for care. I specifically discussed and explained intubation and mechanical ventilation to patient and he wishes to proceed.   08/12/20- patient was weaned on FiO2 to 60%. Spoke to wife [name].  08/13/20- Patient weaned to 40%.  During SBT today he self extubated and was placed on HFNC.  He subsequently became hypoxemic, disoriented aggitated, removed PIVs and colostomy and proceeded to have combative behavior with worsening oxygenation. I have attempted to calm patient and encouraged him to cooperate.  After numerous attempts patient continued to be combative with severe aggitation with combative behavior and would not wear oxygen. Patient was placed on sedation and MV.  Wife updated.   08/14/20- patient remains critically ill.  Weaned from 100%>>55%. Called and updated wife [name] today 12/27-Patient with worsening oxygenation on FiO2 100% peep of 14 08/16/20-Patient with continued high oxygen requirements overnight. Have spoken with surgery and there should be no problem proning patient with this. Was febrile yesterday and thus broad spectrum abx started and cultures attained.  08-17-20-patient down to PEEP 10 FiO2 55%.  Patient never required proning 08/18/20: FiO2 weaned to 40%, PEEP 10, plan for diuresis with Diamox today, adding PO Clonazepam to assist weaning sedation 08/19/20: Vent changed to Pressure Control this morning: 50% FiO2, 24, 22/5, Plan for Recruitment maneuvers  and diuresis 08/20/2020: Continued issues with hypercapnia due to the dead space ventilation.  Worsening despite pressure control.  Switch back to [facility], prone positioning 08/23/19- patient proned today without incident, continue to wean FiO2 , currently on 50% 08/24/19- patient weaned to 40% FiO2, plan to continue proning with full scope of care.  I have spoken to [name] (wife of patient today) she would like to be present prior to next extubation attempt to help console patient and decrease his aggitation.  1/5 failed weaning trials, ver agitated , severe hypoxia, Wife at bedside witnessed failed SAT. 1/6 severe resp failure, hypoxia 1/7 proning for 16 hrs, severe ARDS 1/8 PICC line placed, Had a brief PEA arrest while prone. Proning discontinued 1/10 severe resp failure 1/11 severe ARDS, ENT consulted fro [facility] 1/12 severe resp failure 09/02/20- patient with no acute events.  Spoke with [name] today answered questions and plan is for trache.  09/03/20- patient remains on MV. FiO2 down to 35%PRVC plan for trache. 09/04/20- patient with TG>1000 will switch off propofol but patient with very high sedation requirement, will discuss options with pharmD.  Plan for trache.  Met with wife [name] at bedside.  1/20 extubated, wife at bedside, very weak cough, encephalopathy 1/21 re-intubated, severe resp failure 1/23 needs [facility] for survival 1/24 needs trach, severe resp failure 1/25 s/p [facility] 1/27 unable to do TEE today 1/28 TEE deferred; new anemia (possibly related to recent tracheostomy)   CC  follow up respiratory failure  SUBJECTIVE Patient remains sedated and mechanically ventilated on minimal vent settings after tracheostomy. Febrile overnight to 100.9 F. Per protocol, trach weaning trials have not yet begun. Hemoglobin drop noted today from 9.2 to 6.2. Last CBC check on  1/23. No signs of active bleeding. Stool output from ostomy without blood. Hemodynamics are stable.  BP (!) 127/56   Pulse  (!) 124   Temp (!) 100.7 F (38.2 C) (Axillary)   Resp (!) 36   Ht 6' 0.01" (1.829 m)   Wt 117.2 kg   SpO2 96%   BMI 35.03 kg/m   I/O last 3 completed shifts: In: 4878.9 [I.V.:249.8; NG/GT:3942.7; IV Piggyback:686.4] Out: 2450 [Urine:2070; Stool:380] Total I/O In: 10 [I.V.:10] Out: 500 [Urine:300; Stool:200]  SpO2: 96 % O2 Flow Rate (L/min): 0 L/min FiO2 (%): 35 %  Estimated body mass index is 35.03 kg/m as calculated from the following:   Height as of this encounter: 6' 0.01" (1.829 m).   Weight as of this encounter: 117.2 kg.   REVIEW OF SYSTEMS PATIENT IS UNABLE TO PROVIDE COMPLETE REVIEW OF SYSTEMS DUE TO SEVERE CRITICAL ILLNESS  Pressure Injury 09/17/20 Coccyx Medial Stage 2 -  Partial thickness loss of dermis presenting as a shallow open injury with a red, pink wound bed without slough. (Active)  09/17/20 1010  Location: Coccyx  Location Orientation: Medial  Staging: Stage 2 -  Partial thickness loss of dermis presenting as a shallow open injury with a red, pink wound bed without slough.  Wound Description (Comments):   Present on Admission:    PHYSICAL EXAMINATION:  GENERAL: young male in NAD, sedated HEAD: Normocephalic, atraumatic.  EYES: Pupils equal, round, reactive to light.  No scleral icterus.  MOUTH: Moist mucosal membrane. NECK: Supple. S/p trach PULMONARY: CTA b/l, no W/C/R CARDIOVASCULAR: Tachycardic. No murmurs, rubs, or gallops.  GASTROINTESTINAL: Soft, nontender, -distended.  Positive bowel sounds.   MUSCULOSKELETAL: +edema.  NEUROLOGIC: obtunded, GCS<8 SKIN:intact,warm,dry  MEDICATIONS: I have reviewed all medications and confirmed regimen as documented  CULTURE RESULTS   Recent Results (from the past 240 hour(s))  CULTURE, BLOOD (ROUTINE X 2) w Reflex to ID Panel     Status: None   Collection Time: 09/09/20  1:55 PM   Specimen: BLOOD  Result Value Ref Range Status   Specimen Description BLOOD BLOOD LEFT HAND  Final   Special Requests    Final    BOTTLES DRAWN AEROBIC AND ANAEROBIC Blood Culture adequate volume   Culture   Final    NO GROWTH 5 DAYS Performed at [facility], [address] [place], [place] [address]    Report Status 09/14/2020 FINAL  Final  CULTURE, BLOOD (ROUTINE X 2) w Reflex to ID Panel     Status: None   Collection Time: 09/09/20  2:08 PM   Specimen: BLOOD  Result Value Ref Range Status   Specimen Description BLOOD LEFT ANTECUBITAL  Final   Special Requests   Final    BOTTLES DRAWN AEROBIC AND ANAEROBIC Blood Culture adequate volume   Culture   Final    NO GROWTH 5 DAYS Performed at [facility], [address] [place], [place] [address]    Report Status 09/14/2020 FINAL  Final  CULTURE, BLOOD (ROUTINE X 2) w Reflex to ID Panel     Status: None (Preliminary result)   Collection Time: 09/16/20  9:46 AM   Specimen: BLOOD LEFT HAND  Result Value Ref Range Status   Specimen Description BLOOD LEFT HAND  Final   Special Requests   Final    BOTTLES DRAWN AEROBIC ONLY Blood Culture results may not be optimal due to an inadequate volume of blood received in culture bottles   Culture   Final  NO GROWTH < 24 HOURS Performed at [facility], [address] [place], [place] [address]    Report Status PENDING  Incomplete  CULTURE, BLOOD (ROUTINE X 2) w Reflex to ID Panel     Status: None (Preliminary result)   Collection Time: 09/16/20  9:52 AM   Specimen: Joint, Finger; Blood  Result Value Ref Range Status   Specimen Description FINGER LEFT BLOOD  Final   Special Requests   Final    BOTTLES DRAWN AEROBIC AND ANAEROBIC Blood Culture adequate volume   Culture   Final    NO GROWTH < 24 HOURS Performed at [facility], [address], [place], [place] [address]    Report Status PENDING  Incomplete  Culture, respiratory (non-expectorated)     Status: None (Preliminary result)   Collection Time: 09/16/20 12:20 PM   Specimen: Tracheal Aspirate; Respiratory   Result Value Ref Range Status   Specimen Description   Final    TRACHEAL ASPIRATE Performed at [facility], [address], [place], [place] [address]    Special Requests   Final    NONE Performed at [facility], [address] [place], [place] [address]    Gram Stain   Final    RARE WBC PRESENT,BOTH PMN AND MONONUCLEAR MODERATE GRAM POSITIVE COCCI IN PAIRS    Culture   Final    MODERATE STAPHYLOCOCCUS AUREUS SUSCEPTIBILITIES TO FOLLOW Performed at [facility], [place] [address], [place], [place] [address]    Report Status PENDING  Incomplete      IMAGING   DG Chest Port 1 View  Result Date: 09/17/2020 CLINICAL DATA:  Acute respiratory failure EXAM: PORTABLE CHEST 1 VIEW COMPARISON:  Eight days ago FINDINGS: Tracheostomy tube since prior. Right PICC with tip at the upper cavoatrial junction. Unchanged multifocal pneumonia. Generous heart size which is stable. No visible effusion or pneumothorax. Artifact from EKG leads. IMPRESSION: 1. Stable pulmonary infiltrates. 2. Unremarkable hardware. Electronically Signed   By: Monte Fantasia M.D.   On: 09/17/2020 06:23    Nutrition Status: Nutrition Problem: Increased nutrient needs Etiology: catabolic illness ([id number]) Signs/Symptoms: estimated needs Interventions: Refer to RD note for recommendations   Indwelling Urinary Catheter Discontinue Foley     Ventilator continued, requirement due to severe respiratory failure   Ventilator Sedation RASS 0   ASSESSMENT AND PLAN SYNOPSIS 30 yo male recent hospitalization for perforated diverticulitis s/p colostomy with reversal and leakage of anastomosis with s/p revision now acutely hypoxemic with COVID19 induced severe ARDS. failure to wean from VENT-self extubation and trial of extubation failed due to Severe encephalopathy leading to inability to protect airway. Tracheostomy performed 1/25. Plan for transfer to [facility] when bed is  available.  PULMONARY Severe ACUTE Hypoxic and Hypercapnic Respiratory Failure Covid-19 pneumonia MSSA pneumonia Morbid obesity, possible OSA.  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 Treat fever, beta blocker and pain control -Unable to do TEE, deferred for now -May be reasonable to treat empirically for 6 weeks for IE given poor windows and severity of illness  ID MSSA BACTEREMIA MSSA PNEUMONIA -on off fever -ID following, continue Ancef -Obtain new culture data given new fever -Discontinue Foley catheter  HEME ANEMIA Hgb  7.2 today Hold blood  GI PEG in place, continue TF at goal rate  DIET SEVERE MALNURITION Continue TF via PEG Nutrition following Constipation protocol as indicated  ENDO - will use ICU hypoglycemic\Hyperglycemia protocol if indicated   ELECTROLYTES -follow labs as needed -replace as needed -pharmacy consultation and following   DVT/GI PRX ordered and assessed TRANSFUSIONS AS NEEDED MONITOR FSBS I Assessed the need for Labs I Assessed the need for Foley I Assessed the need for Central Venous Line Family Discussion when available I Assessed the need for Mobilization I made an Assessment of medications to be adjusted accordingly Safety Risk assessment completed   CASE DISCUSSED IN MULTIDISCIPLINARY ROUNDS WITH ICU TEAM  Critical Care Time devoted to patient care services described in this note is 40 minutes.   Overall, patient is critically ill, prognosis is guarded.   Nelle Don, MD 09/17/20 5:16 PM

## 2020-09-18 DIAGNOSIS — L8990 Pressure ulcer of unspecified site, unspecified stage: Secondary | ICD-10-CM | POA: Insufficient documentation

## 2020-09-18 DIAGNOSIS — J9601 Acute respiratory failure with hypoxia: Secondary | ICD-10-CM | POA: Diagnosis not present

## 2020-09-18 DIAGNOSIS — U071 COVID-19: Secondary | ICD-10-CM | POA: Diagnosis not present

## 2020-09-18 LAB — SEDIMENTATION RATE: Sed Rate: 126 mm/hr — ABNORMAL HIGH (ref 0–15)

## 2020-09-18 LAB — CBC WITH DIFFERENTIAL/PLATELET
Abs Immature Granulocytes: 0.07 10*3/uL (ref 0.00–0.07)
Basophils Absolute: 0 10*3/uL (ref 0.0–0.1)
Basophils Relative: 0 %
Eosinophils Absolute: 0.1 10*3/uL (ref 0.0–0.5)
Eosinophils Relative: 1 %
HCT: 25.8 % — ABNORMAL LOW (ref 39.0–52.0)
Hemoglobin: 7.6 g/dL — ABNORMAL LOW (ref 13.0–17.0)
Immature Granulocytes: 1 %
Lymphocytes Relative: 12 %
Lymphs Abs: 0.8 10*3/uL (ref 0.7–4.0)
MCH: 27.1 pg (ref 26.0–34.0)
MCHC: 29.5 g/dL — ABNORMAL LOW (ref 30.0–36.0)
MCV: 92.1 fL (ref 80.0–100.0)
Monocytes Absolute: 0.6 10*3/uL (ref 0.1–1.0)
Monocytes Relative: 9 %
Neutro Abs: 5.4 10*3/uL (ref 1.7–7.7)
Neutrophils Relative %: 77 %
Platelets: 127 10*3/uL — ABNORMAL LOW (ref 150–400)
RBC: 2.8 MIL/uL — ABNORMAL LOW (ref 4.22–5.81)
RDW: 18 % — ABNORMAL HIGH (ref 11.5–15.5)
WBC: 7 10*3/uL (ref 4.0–10.5)
nRBC: 0.3 % — ABNORMAL HIGH (ref 0.0–0.2)

## 2020-09-18 LAB — COMPREHENSIVE METABOLIC PANEL
ALT: 11 U/L (ref 0–44)
AST: 16 U/L (ref 15–41)
Albumin: 1.9 g/dL — ABNORMAL LOW (ref 3.5–5.0)
Alkaline Phosphatase: 70 U/L (ref 38–126)
Anion gap: 10 (ref 5–15)
BUN: 8 mg/dL (ref 6–20)
CO2: 33 mmol/L — ABNORMAL HIGH (ref 22–32)
Calcium: 8 mg/dL — ABNORMAL LOW (ref 8.9–10.3)
Chloride: 96 mmol/L — ABNORMAL LOW (ref 98–111)
Creatinine, Ser: <0.3 mg/dL — ABNORMAL LOW (ref 0.61–1.24)
Glucose, Bld: 228 mg/dL — ABNORMAL HIGH (ref 70–99)
Potassium: 3.8 mmol/L (ref 3.5–5.1)
Sodium: 139 mmol/L (ref 135–145)
Total Bilirubin: 0.4 mg/dL (ref 0.3–1.2)
Total Protein: 5.6 g/dL — ABNORMAL LOW (ref 6.5–8.1)

## 2020-09-18 LAB — CULTURE, RESPIRATORY W GRAM STAIN

## 2020-09-18 LAB — MAGNESIUM: Magnesium: 1.7 mg/dL (ref 1.7–2.4)

## 2020-09-18 LAB — GLUCOSE, CAPILLARY
Glucose-Capillary: 215 mg/dL — ABNORMAL HIGH (ref 70–99)
Glucose-Capillary: 216 mg/dL — ABNORMAL HIGH (ref 70–99)
Glucose-Capillary: 223 mg/dL — ABNORMAL HIGH (ref 70–99)
Glucose-Capillary: 235 mg/dL — ABNORMAL HIGH (ref 70–99)

## 2020-09-18 LAB — PHOSPHORUS: Phosphorus: 3.7 mg/dL (ref 2.5–4.6)

## 2020-09-18 LAB — C-REACTIVE PROTEIN: CRP: 17.3 mg/dL — ABNORMAL HIGH (ref <1.0)

## 2020-09-18 LAB — MRSA PCR SCREENING: MRSA by PCR: NEGATIVE

## 2020-09-18 MED ORDER — NON FORMULARY: 8.0000 mg | Freq: Every day | Status: DC

## 2020-09-18 MED ORDER — DOXAZOSIN MESYLATE 1 MG PO TABS
1.0000 mg | ORAL_TABLET | Freq: Every day | ORAL | Status: DC
  Administered 2020-09-18 – 2020-09-24 (×6): 1 mg
  Filled 2020-09-18 (×7): qty 1

## 2020-09-18 MED ORDER — MORPHINE SULFATE (PF) 2 MG/ML IV SOLN: 2.0000 mg | Freq: Once | INTRAVENOUS | Status: DC

## 2020-09-18 MED ORDER — MAGNESIUM SULFATE 2 GM/50ML IV SOLN
2.0000 g | Freq: Once | INTRAVENOUS | Status: AC
  Administered 2020-09-18: 2 g via INTRAVENOUS
  Filled 2020-09-18: qty 50

## 2020-09-18 MED ORDER — IBUPROFEN 100 MG/5ML PO SUSP
600.0000 mg | Freq: Once | ORAL | Status: AC
  Administered 2020-09-18: 600 mg
  Filled 2020-09-18 (×2): qty 30

## 2020-09-18 MED ORDER — CARVEDILOL 25 MG PO TABS
25.0000 mg | ORAL_TABLET | Freq: Two times a day (BID) | ORAL | Status: DC
  Administered 2020-09-18 – 2020-09-19 (×4): 25 mg via ORAL
  Filled 2020-09-18: qty 2
  Filled 2020-09-18 (×2): qty 1

## 2020-09-18 MED ORDER — ACETAMINOPHEN 10 MG/ML IV SOLN
1000.0000 mg | Freq: Once | INTRAVENOUS | Status: AC
  Administered 2020-09-18: 1000 mg via INTRAVENOUS
  Filled 2020-09-18: qty 100

## 2020-09-18 MED ORDER — METOPROLOL TARTRATE 5 MG/5ML IV SOLN
5.0000 mg | Freq: Four times a day (QID) | INTRAVENOUS | Status: DC | PRN
  Administered 2020-09-20 – 2020-09-24 (×3): 5 mg via INTRAVENOUS
  Filled 2020-09-18 (×5): qty 5

## 2020-09-18 NOTE — Progress Notes (Signed)
[facility] for Electrolyte Monitoring and Replacement   Recent Labs: Potassium (mmol/L)  Date Value  09/18/2020 3.8   Magnesium (mg/dL)  Date Value  09/18/2020 1.7   Calcium (mg/dL)  Date Value  09/18/2020 8.0 (L)   Albumin (g/dL)  Date Value  09/18/2020 1.9 (L)   Phosphorus (mg/dL)  Date Value  09/18/2020 3.7   Sodium (mmol/L)  Date Value  09/18/2020 139   Corrected Ca: 9.6 mg/dL  Assessment: 30 yo male admitted with perforated diverticulitis and severe acute hypoxemic respiratory failure secondary to COVID-19 infection. Patient has severe ARDs and was reintubated 1/21. Pharmacy has been consulted for electrolyte monitoring and replacement.   Patient is status post tracheostomy 1/25 and PEG 1/26. Nutrition provided via tube feeds. Patient is receiving intermittent diuresis.   Goal of Therapy:  Electrolytes WNL   Plan:  Magnesium 2 g IV x 1 ordered. No further replacement indicated at this time.  Pharmacy will continue to follow and replace electrolytes as needed.   Tawnya Crook, PharmD 09/18/2020 7:20 AM

## 2020-09-18 NOTE — Progress Notes (Signed)
CRITICAL CARE NOTE  30 yo male recent hospitalization for perforated diverticulitis s/p colostomy with reversal and leakage of anastomosis with s/p revision now acutely hypoxemic with COVID19 induced severe ARDS.     INTERVAL EVENTS: 08/11/20-patient continued to decline with worsening respiratory status despite 100%Fio2 on BIPAP and HFNC failure.  He stated that he feels he is dying and cannot breathe.  I discussed with wife Craig [name] earlier today that patient is critically ill may need ETT, she is agreeable and thankful for care. I specifically discussed and explained intubation and mechanical ventilation to patient and he wishes to proceed.   08/12/20- patient was weaned on FiO2 to 60%. Spoke to wife Craig [name].  08/13/20- Patient weaned to 40%.  During SBT today he self extubated and was placed on HFNC.  He subsequently became hypoxemic, disoriented aggitated, removed PIVs and colostomy and proceeded to have combative behavior with worsening oxygenation. I have attempted to calm patient and encouraged him to cooperate.  After numerous attempts patient continued to be combative with severe aggitation with combative behavior and would not wear oxygen. Patient was placed on sedation and MV.  Wife updated.   08/14/20- patient remains critically ill.  Weaned from 100%>>55%. Called and updated wife Craig [name] today 12/27-Patient with worsening oxygenation on FiO2 100% peep of 14 08/16/20-Patient with continued high oxygen requirements overnight. Have spoken with surgery and there should be no problem proning patient with this. Was febrile yesterday and thus broad spectrum abx started and cultures attained.  08-17-20-patient down to PEEP 10 FiO2 55%.  Patient never required proning 08/18/20: FiO2 weaned to 40%, PEEP 10, plan for diuresis with Diamox today, adding PO Clonazepam to assist weaning sedation 08/19/20: Vent changed to Pressure Control this morning: 50% FiO2, 24, 22/5, Plan for Recruitment maneuvers  and diuresis 08/20/2020: Continued issues with hypercapnia due to the dead space ventilation.  Worsening despite pressure control.  Switch back to [facility], prone positioning 08/23/19- patient proned today without incident, continue to wean FiO2 , currently on 50% 08/24/19- patient weaned to 40% FiO2, plan to continue proning with full scope of care.  I have spoken to Craig [name] (wife of patient today) she would like to be present prior to next extubation attempt to help console patient and decrease his aggitation.  1/5 failed weaning trials, ver agitated , severe hypoxia, Wife at bedside witnessed failed SAT. 1/6 severe resp failure, hypoxia 1/7 proning for 16 hrs, severe ARDS 1/8 PICC line placed, Had a brief PEA arrest while prone. Proning discontinued 1/10 severe resp failure 1/11 severe ARDS, ENT consulted fro [facility] 1/12 severe resp failure 09/02/20- patient with no acute events.  Spoke with Craig [name] today answered questions and plan is for trache.  09/03/20- patient remains on MV. FiO2 down to 35%PRVC plan for trache. 09/04/20- patient with TG>1000 will switch off propofol but patient with very high sedation requirement, will discuss options with pharmD.  Plan for trache.  Met with wife Craig [name] at bedside.  1/20 extubated, wife at bedside, very weak cough, encephalopathy 1/21 re-intubated, severe resp failure 1/23 needs [facility] for survival 1/24 needs trach, severe resp failure 1/25 s/p [facility] 1/27 unable to do TEE today 1/28 TEE deferred; new anemia (possibly related to recent tracheostomy) 1/30 recurrent fever   CC  follow up respiratory failure  SUBJECTIVE Patient remains sedated and mechanically ventilated on minimal vent settings after tracheostomy. Febrile overnight to 100.9 F. Per protocol, trach weaning trials have not yet begun. Hemoglobin drop noted today from 9.2 to 6.2. Last  CBC check on 1/23. No signs of active bleeding. Stool output from ostomy without blood. Hemodynamics are  stable.  BP (!) 119/54   Pulse (!) 111   Temp (!) 101.7 F (38.7 C) (Axillary)   Resp (!) 26   Ht 6' 0.01" (1.829 m)   Wt 119.3 kg   SpO2 95%   BMI 35.66 kg/m   I/O last 3 completed shifts: In: 4958.8 [I.V.:1447.4; NG/GT:2840; IV Piggyback:671.4] Out: 2080 [Urine:1200; Stool:880] Total I/O In: 10 [I.V.:10] Out: -   SpO2: 95 % O2 Flow Rate (L/min): 0 L/min FiO2 (%): 35 %  Estimated body mass index is 35.66 kg/m as calculated from the following:   Height as of this encounter: 6' 0.01" (1.829 m).   Weight as of this encounter: 119.3 kg.   REVIEW OF SYSTEMS PATIENT IS UNABLE TO PROVIDE COMPLETE REVIEW OF SYSTEMS DUE TO SEVERE CRITICAL ILLNESS  Pressure Injury 09/17/20 Coccyx Medial Stage 2 -  Partial thickness loss of dermis presenting as a shallow open injury with a red, pink wound bed without slough. (Active)  09/17/20 1010  Location: Coccyx  Location Orientation: Medial  Staging: Stage 2 -  Partial thickness loss of dermis presenting as a shallow open injury with a red, pink wound bed without slough.  Wound Description (Comments):   Present on Admission:    PHYSICAL EXAMINATION:  GENERAL: young male, status post tracheostomy, mechanically ventilated  HEAD: Normocephalic, atraumatic.  EYES: Pupils equal, round, reactive to light.  No scleral icterus.  MOUTH: Moist mucosa, no thrush. NECK: Supple. S/p trach PULMONARY: Coarse breath sounds, no rhonchi or wheezes. CARDIOVASCULAR: Tachycardic. No murmurs, rubs, or gallops.  GASTROINTESTINAL: Soft, nontender, -distended.  Positive bowel sounds.   MUSCULOSKELETAL: +2 anasarca, muscle wasting.  NEUROLOGIC: Sedated, does become asynchronous when sedation is lightened. SKIN:intact,warm,dry  Scheduled Meds: . sodium chloride   Intravenous Once  . allopurinol  300 mg Per Tube Daily  . carvedilol  25 mg Oral BID  . chlorhexidine gluconate (MEDLINE KIT)  15 mL Mouth Rinse BID  . Chlorhexidine Gluconate Cloth  6 each  Topical Daily  . clonazePAM  0.5 mg Per Tube BID  . clotrimazole-betamethasone   Topical BID  . doxazosin  1 mg Per Tube Daily  . feeding supplement (PROSource TF)  45 mL Per Tube TID  . ibuprofen  600 mg Per Tube Once  . insulin aspart  0-15 Units Subcutaneous Q4H  . insulin aspart  3 Units Subcutaneous Q4H  . mouth rinse  15 mL Mouth Rinse 10 times per day  . midodrine  10 mg Per Tube TID WC  . oxyCODONE  10 mg Per Tube Q6H  . pantoprazole (PROTONIX) IV  40 mg Intravenous Q12H  . sodium chloride flush  10-40 mL Intracatheter Q12H  . valproic acid  250 mg Per Tube BID   Continuous Infusions: . sodium chloride 10 mL/hr at 09/18/20 1848  .  ceFAZolin (ANCEF) IV 2 g (09/18/20 1959)  . feeding supplement (VITAL 1.5 CAL) 1,000 mL (09/18/20 0315)  . fentaNYL infusion INTRAVENOUS Stopped (09/17/20 0816)  . midazolam 0.5 mg/hr (09/15/20 1800)   PRN Meds:.acetaminophen, albuterol, hydrALAZINE, LORazepam, metoprolol tartrate, ondansetron **OR** ondansetron (ZOFRAN) IV, sodium chloride flush   CULTURE RESULTS   Recent Results (from the past 240 hour(s))  CULTURE, BLOOD (ROUTINE X 2) w Reflex to ID Panel     Status: None   Collection Time: 09/09/20  1:55 PM   Specimen: BLOOD  Result Value Ref Range Status  Specimen Description BLOOD BLOOD LEFT HAND  Final   Special Requests   Final    BOTTLES DRAWN AEROBIC AND ANAEROBIC Blood Culture adequate volume   Culture   Final    NO GROWTH 5 DAYS Performed at [facility], [address] [place], [place] [address]    Report Status 09/14/2020 FINAL  Final  CULTURE, BLOOD (ROUTINE X 2) w Reflex to ID Panel     Status: None   Collection Time: 09/09/20  2:08 PM   Specimen: BLOOD  Result Value Ref Range Status   Specimen Description BLOOD LEFT ANTECUBITAL  Final   Special Requests   Final    BOTTLES DRAWN AEROBIC AND ANAEROBIC Blood Culture adequate volume   Culture   Final    NO GROWTH 5 DAYS Performed at [facility],  [address] [place], [place] [address]    Report Status 09/14/2020 FINAL  Final  CULTURE, BLOOD (ROUTINE X 2) w Reflex to ID Panel     Status: None (Preliminary result)   Collection Time: 09/16/20  9:46 AM   Specimen: BLOOD LEFT HAND  Result Value Ref Range Status   Specimen Description BLOOD LEFT HAND  Final   Special Requests   Final    BOTTLES DRAWN AEROBIC ONLY Blood Culture results may not be optimal due to an inadequate volume of blood received in culture bottles   Culture   Final    NO GROWTH 2 DAYS Performed at [facility], [address] [place], [place] [address]    Report Status PENDING  Incomplete  CULTURE, BLOOD (ROUTINE X 2) w Reflex to ID Panel     Status: None (Preliminary result)   Collection Time: 09/16/20  9:52 AM   Specimen: Joint, Finger; Blood  Result Value Ref Range Status   Specimen Description FINGER LEFT BLOOD  Final   Special Requests   Final    BOTTLES DRAWN AEROBIC AND ANAEROBIC Blood Culture adequate volume   Culture   Final    NO GROWTH 2 DAYS Performed at [facility], [address] [place], [place] [address]    Report Status PENDING  Incomplete  Culture, respiratory (non-expectorated)     Status: None   Collection Time: 09/16/20 12:20 PM   Specimen: Tracheal Aspirate; Respiratory  Result Value Ref Range Status   Specimen Description   Final    TRACHEAL ASPIRATE Performed at [facility], [address] [place], [place] [address]    Special Requests   Final    NONE Performed at [facility], [address] [place], [place] [address]    Gram Stain   Final    RARE WBC PRESENT,BOTH PMN AND MONONUCLEAR MODERATE GRAM POSITIVE COCCI IN PAIRS Performed at [facility], [place] [address] [place],  [address]    Culture MODERATE STAPHYLOCOCCUS AUREUS  Final   Report Status 09/18/2020 FINAL  Final   Organism ID, Bacteria STAPHYLOCOCCUS AUREUS  Final      Susceptibility   Staphylococcus  aureus - MIC*    CIPROFLOXACIN <=0.5 SENSITIVE Sensitive     ERYTHROMYCIN >=8 RESISTANT Resistant     GENTAMICIN <=0.5 SENSITIVE Sensitive     OXACILLIN 0.5 SENSITIVE Sensitive     TETRACYCLINE <=1 SENSITIVE Sensitive     VANCOMYCIN <=0.5 SENSITIVE Sensitive     TRIMETH/SULFA <=10 SENSITIVE Sensitive     CLINDAMYCIN RESISTANT Resistant     RIFAMPIN <=0.5 SENSITIVE Sensitive     Inducible Clindamycin  POSITIVE Resistant     * MODERATE STAPHYLOCOCCUS AUREUS  Aerobic Culture (superficial specimen)     Status: None (Preliminary result)   Collection Time: 09/16/20  2:23 PM   Specimen: Skin, Cyst/Tag/Debridement; Wound  Result Value Ref Range Status   Specimen Description   Final    SKIN Performed at [facility], [address] [place], [place] [address]    Special Requests   Final    NONE Performed at [facility], [address] [place], [place] [address]    Gram Stain   Final    NO WBC SEEN MODERATE GRAM POSITIVE COCCI IN PAIRS FEW GRAM NEGATIVE RODS Performed at [facility], [place] [address], [place], [place] [address]    Culture PENDING  Incomplete   Report Status PENDING  Incomplete  MRSA PCR Screening     Status: None   Collection Time: 09/18/20  4:50 AM   Specimen: Nasopharyngeal  Result Value Ref Range Status   MRSA by PCR NEGATIVE NEGATIVE Final    Comment:        The GeneXpert MRSA Assay (FDA approved for NASAL specimens only), is one component of a comprehensive MRSA colonization surveillance program. It is not intended to diagnose MRSA infection nor to guide or monitor treatment for MRSA infections. Performed at [facility], [address] [place], [place] [address]      Results for orders placed or performed during the hospital encounter of 08/09/20 (from the past 24 hour(s))  Glucose, capillary     Status: Abnormal   Collection Time: 09/17/20  8:32 PM  Result Value Ref Range   Glucose-Capillary 205 (H) 70 - 99  mg/dL  Glucose, capillary     Status: Abnormal   Collection Time: 09/17/20 11:33 PM  Result Value Ref Range   Glucose-Capillary 182 (H) 70 - 99 mg/dL  Magnesium     Status: None   Collection Time: 09/18/20  4:44 AM  Result Value Ref Range   Magnesium 1.7 1.7 - 2.4 mg/dL  CBC with Differential/Platelet     Status: Abnormal   Collection Time: 09/18/20  4:44 AM  Result Value Ref Range   WBC 7.0 4.0 - 10.5 K/uL   RBC 2.80 (L) 4.22 - 5.81 MIL/uL   Hemoglobin 7.6 (L) 13.0 - 17.0 g/dL   HCT 25.8 (L) 39.0 - 52.0 %   MCV 92.1 80.0 - 100.0 fL   MCH 27.1 26.0 - 34.0 pg   MCHC 29.5 (L) 30.0 - 36.0 g/dL   RDW 18.0 (H) 11.5 - 15.5 %   Platelets 127 (L) 150 - 400 K/uL   nRBC 0.3 (H) 0.0 - 0.2 %   Neutrophils Relative % 77 %   Neutro Abs 5.4 1.7 - 7.7 K/uL   Lymphocytes Relative 12 %   Lymphs Abs 0.8 0.7 - 4.0 K/uL   Monocytes Relative 9 %   Monocytes Absolute 0.6 0.1 - 1.0 K/uL   Eosinophils Relative 1 %   Eosinophils Absolute 0.1 0.0 - 0.5 K/uL   Basophils Relative 0 %   Basophils Absolute 0.0 0.0 - 0.1 K/uL   Immature Granulocytes 1 %   Abs Immature Granulocytes 0.07 0.00 - 0.07 K/uL  Comprehensive metabolic panel     Status: Abnormal   Collection Time: 09/18/20  4:44 AM  Result Value Ref Range   Sodium 139 135 - 145 mmol/L   Potassium 3.8 3.5 - 5.1 mmol/L   Chloride 96 (L) 98 - 111 mmol/L  CO2 33 (H) 22 - 32 mmol/L   Glucose, Bld 228 (H) 70 - 99 mg/dL   BUN 8 6 - 20 mg/dL   Creatinine, Ser <0.30 (L) 0.61 - 1.24 mg/dL   Calcium 8.0 (L) 8.9 - 10.3 mg/dL   Total Protein 5.6 (L) 6.5 - 8.1 g/dL   Albumin 1.9 (L) 3.5 - 5.0 g/dL   AST 16 15 - 41 U/L   ALT 11 0 - 44 U/L   Alkaline Phosphatase 70 38 - 126 U/L   Total Bilirubin 0.4 0.3 - 1.2 mg/dL   GFR, Estimated NOT CALCULATED >60 mL/min   Anion gap 10 5 - 15  Phosphorus     Status: None   Collection Time: 09/18/20  4:44 AM  Result Value Ref Range   Phosphorus 3.7 2.5 - 4.6 mg/dL  MRSA PCR Screening     Status: None    Collection Time: 09/18/20  4:50 AM   Specimen: Nasopharyngeal  Result Value Ref Range   MRSA by PCR NEGATIVE NEGATIVE  Glucose, capillary     Status: Abnormal   Collection Time: 09/18/20  7:48 AM  Result Value Ref Range   Glucose-Capillary 235 (H) 70 - 99 mg/dL  Sedimentation rate     Status: Abnormal   Collection Time: 09/18/20 11:35 AM  Result Value Ref Range   Sed Rate 126 (H) 0 - 15 mm/hr  C-reactive protein     Status: Abnormal   Collection Time: 09/18/20 11:36 AM  Result Value Ref Range   CRP 17.3 (H) <1.0 mg/dL  Glucose, capillary     Status: Abnormal   Collection Time: 09/18/20  4:09 PM  Result Value Ref Range   Glucose-Capillary 215 (H) 70 - 99 mg/dL  Glucose, capillary     Status: Abnormal   Collection Time: 09/18/20  7:35 PM  Result Value Ref Range   Glucose-Capillary 216 (H) 70 - 99 mg/dL     IMAGING   No results found.  Nutrition Status: Nutrition Problem: Increased nutrient needs Etiology: catabolic illness (GBEEF-00) Signs/Symptoms: estimated needs Interventions: Refer to RD note for recommendations   Indwelling Urinary Catheter  resume Foley due to urinary retention     Ventilator continued, requirement due to severe respiratory failure   Ventilator Sedation RASS 0   ASSESSMENT AND PLAN SYNOPSIS 30 yo male recent hospitalization for perforated diverticulitis s/p colostomy with reversal and leakage of anastomosis with s/p revision now acutely hypoxemic with COVID19 induced severe ARDS. failure to wean from VENT-self extubation and trial of extubation failed due to Severe encephalopathy leading to inability to protect airway. Tracheostomy performed 1/25. Plan for transfer to [facility] when bed is available.  PULMONARY ACUTE Hypoxic and Hypercapnic Respiratory Failure Covid-19 pneumonia MSSA pneumonia Morbid obesity, possible OSA.  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 Treat fever, beta blocker and pain control -Unable to do TEE, deferred for now -May be reasonable to treat empirically for 6 weeks for IE given poor windows and severity of illness  ID MSSA BACTEREMIA MSSA PNEUMONIA -on off fever -ID following, continue Ancef -Obtain new culture data given new fever -Discontinue Foley catheter  FEVER On antibiotics Has responded to steroids previously Query CTD Screen  for CTD C-reactive protein elevated again May need to resume steroids  HEME ANEMIA Hgb 7.6 today Hold blood  GI PEG in place, continue TF at goal rate  DIET SEVERE MALNURITION Continue TF via PEG Nutrition following Constipation protocol as indicated  ENDO - will use ICU hypoglycemic\Hyperglycemia protocol if indicated   ELECTROLYTES -follow labs as needed -replace as needed -pharmacy consultation and following   DVT/GI PRX ordered and assessed TRANSFUSIONS AS NEEDED MONITOR FSBS I Assessed the need for Labs I Assessed the need for Foley I Assessed the need for Central Venous Line Family Discussion when available I Assessed the need for Mobilization I made an Assessment of medications to be adjusted accordingly Safety Risk assessment completed     Critical Care Time devoted to patient care services described in this note is 40 minutes.   Overall, patient is critically ill, prognosis is guarded.    Renold Don, MD [facility]  09/18/20 10:35 AM     *This note was dictated using voice recognition software/Dragon.  Despite best efforts to proofread, errors can occur which can change the meaning.  Any change was purely  unintentional.

## 2020-09-19 DIAGNOSIS — R509 Fever, unspecified: Secondary | ICD-10-CM | POA: Diagnosis not present

## 2020-09-19 DIAGNOSIS — G9341 Metabolic encephalopathy: Secondary | ICD-10-CM | POA: Diagnosis not present

## 2020-09-19 DIAGNOSIS — R7881 Bacteremia: Secondary | ICD-10-CM | POA: Diagnosis not present

## 2020-09-19 DIAGNOSIS — J9601 Acute respiratory failure with hypoxia: Secondary | ICD-10-CM | POA: Diagnosis not present

## 2020-09-19 DIAGNOSIS — U071 COVID-19: Secondary | ICD-10-CM | POA: Diagnosis not present

## 2020-09-19 LAB — CBC
HCT: 25.1 % — ABNORMAL LOW (ref 39.0–52.0)
Hemoglobin: 7.7 g/dL — ABNORMAL LOW (ref 13.0–17.0)
MCH: 27.8 pg (ref 26.0–34.0)
MCHC: 30.7 g/dL (ref 30.0–36.0)
MCV: 90.6 fL (ref 80.0–100.0)
Platelets: 154 10*3/uL (ref 150–400)
RBC: 2.77 MIL/uL — ABNORMAL LOW (ref 4.22–5.81)
RDW: 17.8 % — ABNORMAL HIGH (ref 11.5–15.5)
WBC: 7.9 10*3/uL (ref 4.0–10.5)
nRBC: 0 % (ref 0.0–0.2)

## 2020-09-19 LAB — BASIC METABOLIC PANEL
Anion gap: 9 (ref 5–15)
BUN: 6 mg/dL (ref 6–20)
CO2: 34 mmol/L — ABNORMAL HIGH (ref 22–32)
Calcium: 8.3 mg/dL — ABNORMAL LOW (ref 8.9–10.3)
Chloride: 96 mmol/L — ABNORMAL LOW (ref 98–111)
Creatinine, Ser: <0.3 mg/dL — ABNORMAL LOW (ref 0.61–1.24)
Glucose, Bld: 186 mg/dL — ABNORMAL HIGH (ref 70–99)
Potassium: 3.5 mmol/L (ref 3.5–5.1)
Sodium: 139 mmol/L (ref 135–145)

## 2020-09-19 LAB — PHOSPHORUS: Phosphorus: 3.4 mg/dL (ref 2.5–4.6)

## 2020-09-19 LAB — GLUCOSE, CAPILLARY
Glucose-Capillary: 176 mg/dL — ABNORMAL HIGH (ref 70–99)
Glucose-Capillary: 230 mg/dL — ABNORMAL HIGH (ref 70–99)
Glucose-Capillary: 170 mg/dL — ABNORMAL HIGH (ref 70–99)
Glucose-Capillary: 160 mg/dL — ABNORMAL HIGH (ref 70–99)
Glucose-Capillary: 160 mg/dL — ABNORMAL HIGH (ref 70–99)
Glucose-Capillary: 183 mg/dL — ABNORMAL HIGH (ref 70–99)
Glucose-Capillary: 185 mg/dL — ABNORMAL HIGH (ref 70–99)

## 2020-09-19 LAB — MAGNESIUM: Magnesium: 1.7 mg/dL (ref 1.7–2.4)

## 2020-09-19 MED ORDER — LINEZOLID 600 MG PO TABS
600.0000 mg | ORAL_TABLET | Freq: Two times a day (BID) | ORAL | Status: DC
  Administered 2020-09-19 – 2020-09-22 (×6): 600 mg
  Filled 2020-09-19 (×7): qty 1

## 2020-09-19 MED ORDER — CARVEDILOL 25 MG PO TABS
25.0000 mg | ORAL_TABLET | Freq: Two times a day (BID) | ORAL | Status: DC
  Administered 2020-09-19 – 2020-09-23 (×7): 25 mg
  Filled 2020-09-19 (×7): qty 1

## 2020-09-19 MED ORDER — MAGNESIUM SULFATE 2 GM/50ML IV SOLN
2.0000 g | Freq: Once | INTRAVENOUS | Status: AC
  Administered 2020-09-19: 2 g via INTRAVENOUS
  Filled 2020-09-19: qty 50

## 2020-09-19 MED ORDER — POTASSIUM CHLORIDE 20 MEQ PO PACK
40.0000 meq | PACK | Freq: Once | ORAL | Status: AC
  Administered 2020-09-19: 40 meq
  Filled 2020-09-19: qty 2

## 2020-09-19 NOTE — Progress Notes (Signed)
[facility] for Electrolyte Monitoring and Replacement   Recent Labs: Potassium (mmol/L)  Date Value  09/19/2020 3.5   Magnesium (mg/dL)  Date Value  09/19/2020 1.7   Calcium (mg/dL)  Date Value  09/19/2020 8.3 (L)   Albumin (g/dL)  Date Value  09/18/2020 1.9 (L)   Phosphorus (mg/dL)  Date Value  09/19/2020 3.4   Sodium (mmol/L)  Date Value  09/19/2020 139    Assessment: 30 yo male admitted with perforated diverticulitis and severe acute hypoxemic respiratory failure secondary to COVID-19 infection. Patient has severe ARDs and was reintubated 1/21. Pharmacy has been consulted for electrolyte monitoring and replacement.   Patient is status post tracheostomy 1/25 and PEG 1/26. Nutrition provided via tube feeds. Patient is receiving intermittent diuresis.   Goal of Therapy:  Electrolytes WNL   Plan:  1/31 AM: Na 139, K 3.5, Mg 1.7, Phos 3.4 --KCl 40 mEq x 1 --IV magnesium sulfate 2 g x 1  Pharmacy will continue to follow and replace electrolytes as needed.   Benita Gutter 09/19/2020 8:25 AM

## 2020-09-19 NOTE — Progress Notes (Signed)
Date of Admission:  08/09/2020        Antibiotic history Ceftriaxone 12/21>>12/26 Azithromycin 12/21>>12/23 Zosyn 12/27>>12/31 Vanco 12/27-1 dose Linezolid 12/28>>12/31 08/20/2020>>09/05/20 no antibiotics 1/18 one dose of cefepime 1/18 cefazolin>>  Micro 12/21 BC-NG 12/21 UC-NG 12/27 BC NG 1/18 BC- MSSA 1/18 Resp culture MSSA 09/16/20- BC -NG 09/16/20 Resp culture-MSSA Tracheal stoma - MSSA  LDA 08/13/20>>09/13/20- ETT Tracheostomy 09/10/20 08/27/20>>09/07/20 PICC-rt 08/25/20 Foley PICC 09/13/20 PEG 09/14/20 Colostomy    ID: Craig [name] is a 30 y.o. male  Principal Problem:   Acute hypoxemic respiratory failure due to COVID-19 [facility]) Active Problems:   Severe sepsis ([place])   AKI (acute kidney injury) ([place])   Hypertension   Fever   Metabolic encephalopathy   Staphylococcus aureus bacteremia   Pressure injury of skin    Subjective: Remains on the vent and sedated fever Medications:  . sodium chloride   Intravenous Once  . allopurinol  300 mg Per Tube Daily  . carvedilol  25 mg Oral BID  . chlorhexidine gluconate (MEDLINE KIT)  15 mL Mouth Rinse BID  . Chlorhexidine Gluconate Cloth  6 each Topical Daily  . clonazePAM  0.5 mg Per Tube BID  . clotrimazole-betamethasone   Topical BID  . doxazosin  1 mg Per Tube Daily  . feeding supplement (PROSource TF)  45 mL Per Tube TID  . insulin aspart  0-15 Units Subcutaneous Q4H  . insulin aspart  3 Units Subcutaneous Q4H  . mouth rinse  15 mL Mouth Rinse 10 times per day  . oxyCODONE  10 mg Per Tube Q6H  . pantoprazole (PROTONIX) IV  40 mg Intravenous Q12H  . sodium chloride flush  10-40 mL Intracatheter Q12H  . valproic acid  250 mg Per Tube BID    Objective: Vital signs in last 24 hours: Temp:  [100.4 F (38 C)-102.38 F (39.1 C)] 101.3 F (38.5 C) (01/31 1200) Pulse Rate:  [97-122] 97 (01/31 1200) Resp:  [22-41] 22 (01/31 1200) BP: (93-161)/(46-116) 99/52 (01/31 1200) SpO2:  [91 %-98 %] 97 %  (01/31 1200) FiO2 (%):  [35 %] 35 % (01/31 0806) Weight:  [119.7 kg] 119.7 kg (01/31 0404)  PHYSICAL EXAM:  General:Tracheostomy, on the vent , sedated.  Head: Normocephalic, without obvious abnormality, atraumatic. Eyes: Conjunctivae clear, anicteric sclerae. Pupils are equal ENTcannot examine Neck:, symmetrical,  Back: stage 2  Sacral decub Lungs: b/l air entry Heart: tachycardia Abdomen: Soft, PEG ,colostomy bag Extremities: edematous Rt arm picc foley Skin: No rashes or lesions. Or bruising Neurologic: cannot be assessed  Lab Results Recent Labs    09/18/20 0444 09/19/20 0410  WBC 7.0 7.9  HGB 7.6* 7.7*  HCT 25.8* 25.1*  NA 139 139  K 3.8 3.5  CL 96* 96*  CO2 33* 34*  BUN 8 6  CREATININE <0.30* <0.30*   Liver Panel Recent Labs    09/17/20 0215 09/18/20 0444  PROT  --  5.6*  ALBUMIN 1.4* 1.9*  AST  --  16  ALT  --  11  ALKPHOS  --  70  BILITOT  --  0.4   Sedimentation Rate Recent Labs    09/18/20 1135  ESRSEDRATE 126*   C-Reactive Protein Recent Labs    09/18/20 1136  CRP 17.3*    Studies/Results:  B/l infiltrate  Assessment/Plan: Acute hypoxic respiratory failure due to Covid.  Now has tracheostomy and is on the vent  Fever over the past few days.  Repeat blood cultures were negative.  Tracheal  aspirate has MSSA so asked tracheal stoma.  Foley catheter was removed but was replaced yesterday for urinary retention. We will change cefazolin to linezolid to see whether the fever is due to drug.  MSSA bacteremia and MSSA multifocal pneumonia.  Has been on cefazolin.  TEE was attempted but could not get the probe done behind the tracheostomy.  Anemia  Hypoalbuminemia  Discussed the management with the intensivist.

## 2020-09-19 NOTE — TOC Progression Note (Addendum)
Transition of Care [facility]) - Progression Note    Patient Details  Name: Craig [name] MRN: [id number] Date of Birth: June 25, 1991  Transition of Care [facility]) CM/SW [place], [place] Phone Number: [phone] 09/19/2020, 12:19 PM  Clinical Narrative:     CSW received call from Ramond Dial at [facility] [employer] has denied patient for [facility] stating they believe the physicians notes do not indicate the patient is stable enough for transfer. CSW reached out to Attending for update.       Expected Discharge Plan and Services                                                 Social Determinants of Health (SDOH) Interventions    Readmission Risk Interventions No flowsheet data found.

## 2020-09-19 NOTE — Progress Notes (Signed)
CRITICAL CARE NOTE 30 yo male recent hospitalization for perforated diverticulitis s/p colostomy with reversal and leakage of anastomosis with s/p revision now acutely hypoxemic with COVID19 induced severe ARDS.   INTERVAL EVENTS: 08/11/20-patient continued to decline with worsening respiratory status despite 100%Fio2 on BIPAP and HFNC failure. He stated that he feels he is dying and cannot breathe. I discussed with wife Craig [name] earlier today that patient is critically ill may need ETT, she is agreeable and thankful for care. I specifically discussed and explained intubation and mechanical ventilation to patient and he wishes to proceed.  08/12/20- patient was weaned on FiO2 to 60%. Spoke to wife Craig [name].  08/13/20- Patient weaned to 40%. During SBT today he self extubated and was placed on HFNC. He subsequently became hypoxemic, disoriented aggitated, removed PIVs and colostomy and proceeded to have combative behavior with worsening oxygenation. I have attempted to calm patient and encouraged him to cooperate. After numerous attempts patient continued to be combative with severe aggitation with combative behavior and would not wear oxygen. Patient was placed on sedation and MV. Wife updated.  08/14/20-patient remains critically ill. Weaned from 100%>>55%. Called and updated wife [name] today 12/27-Patient with worsening oxygenation on FiO2 100% peep of 14 08/16/20-Patient with continued high oxygen requirements overnight. Have spoken with surgery and there should be no problem proning patient with this. Was febrile yesterday and thus broad spectrum abx started and cultures attained.  08-17-20-patient down to PEEP 10 FiO2 55%. Patient never required proning 08/18/20:FiO2 weaned to 40%, PEEP 10, plan for diuresis with Diamox today, adding PO Clonazepam to assist weaning sedation 08/19/20: Vent changed to Pressure Control this morning: 50% FiO2, 24, 22/5, Plan for Recruitment maneuvers and  diuresis 08/20/2020:Continued issues with hypercapnia due to the dead space ventilation. Worsening despite pressure control. Switch back to [facility], prone positioning 08/23/19- patient proned today without incident, continue to wean FiO2 , currently on 50% 08/24/19-patient weaned to 40% FiO2, plan to continue proning with full scope of care. I have spoken to Craig [name] (wife of patient today) she would like to be present prior to next extubation attempt to help console patient and decrease his aggitation. 1/56filed weaning trials, ver agitated , severe hypoxia, Wife at bedside witnessed failed SAT. 1/6 severe resp failure, hypoxia 1/7 proning for 16 hrs, severe ARDS 1/8 PICC line placed, Had a brief PEA arrest while prone. Proningdiscontinued 1/10severe resp failure 1/11severe ARDS, [facility] failure 09/02/20-patient with no acute events. Spoke with TJonelle Sidletoday answered questions and plan is for trache.  09/03/20- patient remains on MV. FiO2 down to 35%PRVC plan for trache. 09/04/20- patient with TG>1000 will switch off propofol but patient with very high sedation requirement, will discuss options with pharmD. Plan for trache. Met with wife Craig [name] at bedside. 1/20extubated, wife at bedside, very weak cough, encephalopathy 1/21 re-intubated, severe resp failure [facility]24 needs trach, severe resp failure [facility] s/p [facility] tube 1/27 unable to do TEE today 1/28 TEE deferred; new anemia (possibly related to recent tracheostomy) 1/30 recurrent fever     CC  follow up respiratory failure  SUBJECTIVE Patient remains critically ill Prognosis is guarded   BP (!) 161/86   Pulse (!) 116   Temp (!) 100.9 F (38.3 C) (Rectal)   Resp (!) 34   Ht 6' 0.01" (1.829 m)   Wt 119.7 kg   SpO2 96%   BMI 35.78 kg/m    I/O last 3 completed shifts: In:  3623.4 [I.V.:233.6; NG/GT:2866.7; IV Piggyback:523.1] Out: 2800  [Urine:1850; Drains:250; Stool:700] No intake/output data recorded.  SpO2: 96 % O2 Flow Rate (L/min): 0 L/min FiO2 (%): 35 %  Estimated body mass index is 35.78 kg/m as calculated from the following:   Height as of this encounter: 6' 0.01" (1.829 m).   Weight as of this encounter: 119.7 kg.  SIGNIFICANT EVENTS   REVIEW OF SYSTEMS  PATIENT IS UNABLE TO PROVIDE COMPLETE REVIEW OF SYSTEMS DUE TO SEVERE CRITICAL ILLNESS   Pressure Injury 09/17/20 Coccyx Medial Stage 2 -  Partial thickness loss of dermis presenting as a shallow open injury with a red, pink wound bed without slough. (Active)  09/17/20 1010  Location: Coccyx  Location Orientation: Medial  Staging: Stage 2 -  Partial thickness loss of dermis presenting as a shallow open injury with a red, pink wound bed without slough.  Wound Description (Comments):   Present on Admission:       PHYSICAL EXAMINATION:  GENERAL:critically ill appearing, +resp distress NECK: Supple. S/p trach PULMONARY: +rhonchi, +wheezing CARDIOVASCULAR: S1 and S2. Regular rate and rhythm. No murmurs, rubs, or gallops.  GASTROINTESTINAL: Soft, nontender, -distended.  Positive bowel sounds.   S/p PEG MUSCULOSKELETAL: No swelling, clubbing, or edema.  NEUROLOGIC: obtunded, GCS<8 SKIN:intact,warm,dry  MEDICATIONS: I have reviewed all medications and confirmed regimen as documented   CULTURE RESULTS   Recent Results (from the past 240 hour(s))  CULTURE, BLOOD (ROUTINE X 2) w Reflex to ID Panel     Status: None   Collection Time: 09/09/20  1:55 PM   Specimen: BLOOD  Result Value Ref Range Status   Specimen Description BLOOD BLOOD LEFT HAND  Final   Special Requests   Final    BOTTLES DRAWN AEROBIC AND ANAEROBIC Blood Culture adequate volume   Culture   Final    NO GROWTH 5 DAYS Performed at [facility], [address] [place], [place] [address]    Report Status 09/14/2020 FINAL  Final  CULTURE, BLOOD (ROUTINE X 2) w Reflex to  ID Panel     Status: None   Collection Time: 09/09/20  2:08 PM   Specimen: BLOOD  Result Value Ref Range Status   Specimen Description BLOOD LEFT ANTECUBITAL  Final   Special Requests   Final    BOTTLES DRAWN AEROBIC AND ANAEROBIC Blood Culture adequate volume   Culture   Final    NO GROWTH 5 DAYS Performed at [facility], [address] [place], [place] [address]    Report Status 09/14/2020 FINAL  Final  CULTURE, BLOOD (ROUTINE X 2) w Reflex to ID Panel     Status: None (Preliminary result)   Collection Time: 09/16/20  9:46 AM   Specimen: BLOOD LEFT HAND  Result Value Ref Range Status   Specimen Description BLOOD LEFT HAND  Final   Special Requests   Final    BOTTLES DRAWN AEROBIC ONLY Blood Culture results may not be optimal due to an inadequate volume of blood received in culture bottles   Culture   Final    NO GROWTH 3 DAYS Performed at [facility], [address] [place], [place] [address]    Report Status PENDING  Incomplete  CULTURE, BLOOD (ROUTINE X 2) w Reflex to ID Panel     Status: None (Preliminary result)   Collection Time: 09/16/20  9:52 AM   Specimen: Joint, Finger; Blood  Result Value Ref Range Status   Specimen Description FINGER LEFT BLOOD  Final  Special Requests   Final    BOTTLES DRAWN AEROBIC AND ANAEROBIC Blood Culture adequate volume   Culture   Final    NO GROWTH 3 DAYS Performed at [facility], [address] [place], [place] [address]    Report Status PENDING  Incomplete  Culture, respiratory (non-expectorated)     Status: None   Collection Time: 09/16/20 12:20 PM   Specimen: Tracheal Aspirate; Respiratory  Result Value Ref Range Status   Specimen Description   Final    TRACHEAL ASPIRATE Performed at [facility], [address] [place], [place] [address]    Special Requests   Final    NONE Performed at [facility], [address] [place], [place] [address]    Gram Stain   Final     RARE WBC PRESENT,BOTH PMN AND MONONUCLEAR MODERATE GRAM POSITIVE COCCI IN PAIRS Performed at [facility], [place] [address], [place], [place] [address]    Culture MODERATE STAPHYLOCOCCUS AUREUS  Final   Report Status 09/18/2020 FINAL  Final   Organism ID, Bacteria STAPHYLOCOCCUS AUREUS  Final      Susceptibility   Staphylococcus aureus - MIC*    CIPROFLOXACIN <=0.5 SENSITIVE Sensitive     ERYTHROMYCIN >=8 RESISTANT Resistant     GENTAMICIN <=0.5 SENSITIVE Sensitive     OXACILLIN 0.5 SENSITIVE Sensitive     TETRACYCLINE <=1 SENSITIVE Sensitive     VANCOMYCIN <=0.5 SENSITIVE Sensitive     TRIMETH/SULFA <=10 SENSITIVE Sensitive     CLINDAMYCIN RESISTANT Resistant     RIFAMPIN <=0.5 SENSITIVE Sensitive     Inducible Clindamycin POSITIVE Resistant     * MODERATE STAPHYLOCOCCUS AUREUS  Aerobic Culture (superficial specimen)     Status: None (Preliminary result)   Collection Time: 09/16/20  2:23 PM   Specimen: Skin, Cyst/Tag/Debridement; Wound  Result Value Ref Range Status   Specimen Description   Final    SKIN Performed at [facility], [address] [place], [place] [address]    Special Requests   Final    NONE Performed at [facility], [address] [place], [place] [address]    Gram Stain   Final    NO WBC SEEN MODERATE GRAM POSITIVE COCCI IN PAIRS FEW GRAM NEGATIVE RODS    Culture   Final    MODERATE STAPHYLOCOCCUS AUREUS CULTURE REINCUBATED FOR BETTER GROWTH Performed at [facility], [place] [address] [place], [place] [address]    Report Status PENDING  Incomplete  MRSA PCR Screening     Status: None   Collection Time: 09/18/20  4:50 AM   Specimen: Nasopharyngeal  Result Value Ref Range Status   MRSA by PCR NEGATIVE NEGATIVE Final    Comment:        The GeneXpert MRSA Assay (FDA approved for NASAL specimens only), is one component of a comprehensive MRSA colonization surveillance program. It is not intended to diagnose  MRSA infection nor to guide or monitor treatment for MRSA infections. Performed at [facility], [address] [place], [place] [address]           IMAGING    No results found.   Nutrition Status: Nutrition Problem: Increased nutrient needs Etiology: catabolic illness ([id number]) Signs/Symptoms: estimated needs Interventions: Refer to RD note for recommendations     Indwelling Urinary Catheter continued, requirement due to   Reason to continue Indwelling Urinary Catheter strict Intake/Output monitoring for hemodynamic instability   Central Line/ continued, requirement due  to  Reason to continue [employer] of central venous pressure or other hemodynamic parameters and poor IV access   Ventilator continued, requirement due to severe respiratory failure   Ventilator Sedation RASS 0 to -2      ASSESSMENT AND PLAN SYNOPSIS 30 yo male recent hospitalization for perforated diverticulitis s/p colostomy with reversal and leakage of anastomosis with s/p revision now acutely hypoxemic with COVID19 induced severe ARDS.failure to wean from VENT-self extubation and trial of extubation failed due to Severe encephalopathy leading to inability to protect airway. Tracheostomy performed 1/25. PEG placed 1/26 Plan for transfer to [facility] when bed is available.  Severe ACUTE Hypoxic and Hypercapnic Respiratory Failure -continue Full MV support -continue Bronchodilator Therapy -Wean Fio2 and PEEP as tolerated -will perform SAT/SBT when respiratory parameters are met -VAP/VENT bundle implementation  ACUTE DIASTOLIC CARDIAC FAILURE-  -oxygen as needed -Lasix as tolerated  Morbid obesity, possible OSA.   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(cardiac arrest Jan 8th)  CARDIAC ICU monitoring   INFECTIOUS  DISEASE -continue antibiotics as prescribed -follow up cultures -follow up ID consultation    GI GI PROPHYLAXIS as indicated  DIET-->TF's as tolerated Constipation protocol as indicated  ENDO - will use ICU hypoglycemic\Hyperglycemia protocol if indicated     ELECTROLYTES -follow labs as needed -replace as needed -pharmacy consultation and following   DVT/GI PRX ordered and assessed TRANSFUSIONS AS NEEDED MONITOR FSBS I Assessed the need for Labs I Assessed the need for Foley I Assessed the need for Central Venous Line Family Discussion when available I Assessed the need for Mobilization I made an Assessment of medications to be adjusted accordingly Safety Risk assessment completed   CASE DISCUSSED IN MULTIDISCIPLINARY ROUNDS WITH ICU TEAM  Critical Care Time devoted to patient care services described in this note is 54 minutes.   Overall, patient is critically ill, prognosis is guarded.  Patient with Multiorgan failure and at high risk for cardiac arrest and death.    Corrin Parker, M.D.  [facility]  Medical Director [place] Director [facility] Cardio-Pulmonary Department

## 2020-09-20 ENCOUNTER — Inpatient Hospital Stay: Payer: No Typology Code available for payment source

## 2020-09-20 DIAGNOSIS — U071 COVID-19: Secondary | ICD-10-CM | POA: Diagnosis not present

## 2020-09-20 DIAGNOSIS — R7881 Bacteremia: Secondary | ICD-10-CM | POA: Diagnosis not present

## 2020-09-20 DIAGNOSIS — B9561 Methicillin susceptible Staphylococcus aureus infection as the cause of diseases classified elsewhere: Secondary | ICD-10-CM | POA: Diagnosis not present

## 2020-09-20 DIAGNOSIS — G9341 Metabolic encephalopathy: Secondary | ICD-10-CM | POA: Diagnosis not present

## 2020-09-20 DIAGNOSIS — J9601 Acute respiratory failure with hypoxia: Secondary | ICD-10-CM | POA: Diagnosis not present

## 2020-09-20 DIAGNOSIS — A419 Sepsis, unspecified organism: Secondary | ICD-10-CM | POA: Diagnosis not present

## 2020-09-20 LAB — BPAM RBC
Blood Product Expiration Date: 202203032359
Unit Type and Rh: 5100

## 2020-09-20 LAB — TYPE AND SCREEN
ABO/RH(D): O POS
Antibody Screen: NEGATIVE
Unit division: 0

## 2020-09-20 LAB — MPO/PR-3 (ANCA) ANTIBODIES
ANCA Proteinase 3: <3.5 U/mL (ref 0.0–3.5)
Myeloperoxidase Abs: <9 U/mL (ref 0.0–9.0)

## 2020-09-20 LAB — MAGNESIUM: Magnesium: 1.6 mg/dL — ABNORMAL LOW (ref 1.7–2.4)

## 2020-09-20 LAB — GLUCOSE, CAPILLARY
Glucose-Capillary: 119 mg/dL — ABNORMAL HIGH (ref 70–99)
Glucose-Capillary: 126 mg/dL — ABNORMAL HIGH (ref 70–99)
Glucose-Capillary: 134 mg/dL — ABNORMAL HIGH (ref 70–99)
Glucose-Capillary: 158 mg/dL — ABNORMAL HIGH (ref 70–99)
Glucose-Capillary: 176 mg/dL — ABNORMAL HIGH (ref 70–99)
Glucose-Capillary: 178 mg/dL — ABNORMAL HIGH (ref 70–99)
Glucose-Capillary: 99 mg/dL (ref 70–99)

## 2020-09-20 LAB — BASIC METABOLIC PANEL
Anion gap: 11 (ref 5–15)
BUN: 7 mg/dL (ref 6–20)
CO2: 32 mmol/L (ref 22–32)
Calcium: 8.1 mg/dL — ABNORMAL LOW (ref 8.9–10.3)
Chloride: 96 mmol/L — ABNORMAL LOW (ref 98–111)
Creatinine, Ser: <0.3 mg/dL — ABNORMAL LOW (ref 0.61–1.24)
Glucose, Bld: 172 mg/dL — ABNORMAL HIGH (ref 70–99)
Potassium: 4.2 mmol/L (ref 3.5–5.1)
Sodium: 139 mmol/L (ref 135–145)

## 2020-09-20 LAB — AEROBIC CULTURE W GRAM STAIN (SUPERFICIAL SPECIMEN): Gram Stain: NONE SEEN

## 2020-09-20 LAB — RHEUMATOID FACTOR: Rheumatoid fact SerPl-aCnc: <10 IU/mL (ref <14.0)

## 2020-09-20 LAB — PREPARE RBC (CROSSMATCH)

## 2020-09-20 LAB — ANA W/REFLEX IF POSITIVE: Anti Nuclear Antibody (ANA): NEGATIVE

## 2020-09-20 IMAGING — US US ABDOMEN LIMITED
1 series · 13 of 25 positions shown · non-contrast
Comparison: CT [DATE]

CLINICAL DATA: Fever

EXAM:
ULTRASOUND ABDOMEN LIMITED RIGHT UPPER QUADRANT

[Series 1: us abdomen limited ruq (liver/gb) · 13 of 40 slices shown]
[im 1/40]
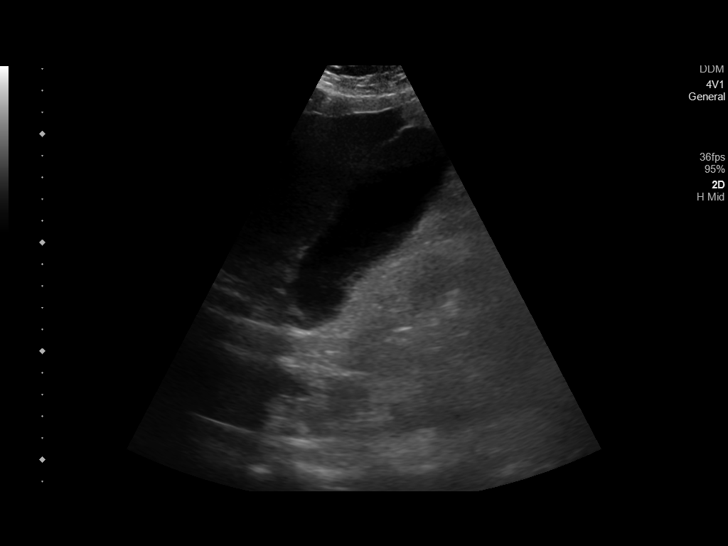
[im 4/40]
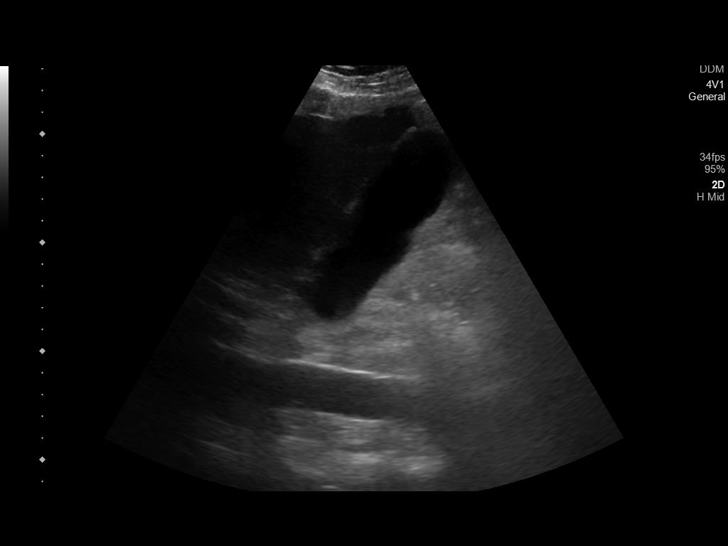
[im 7/40]
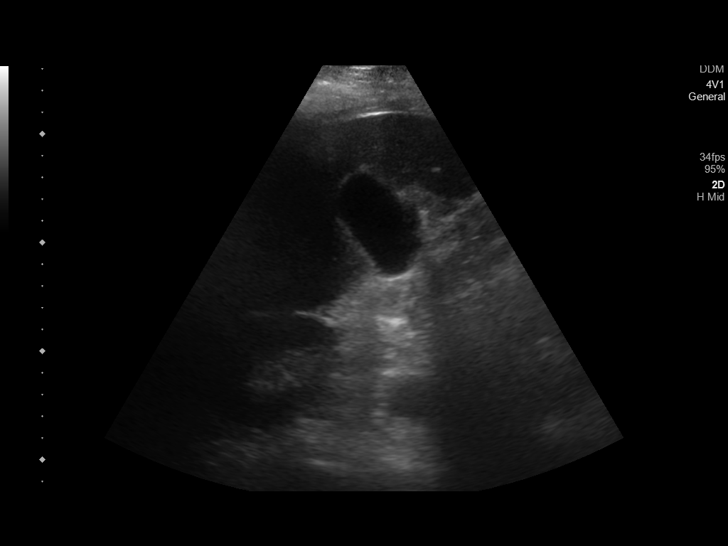
[im 10/40]
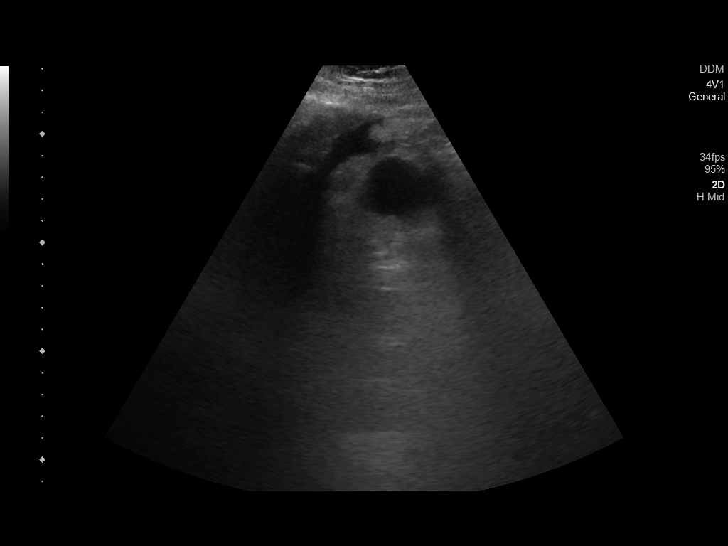
[im 14/40]
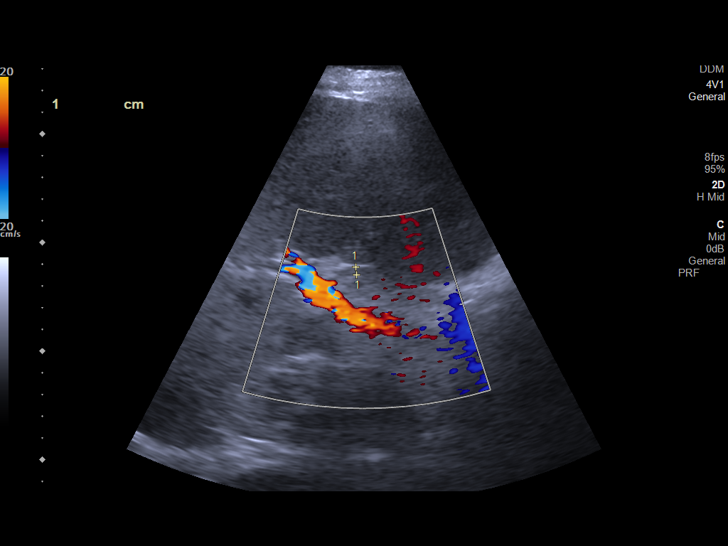
[im 17/40]
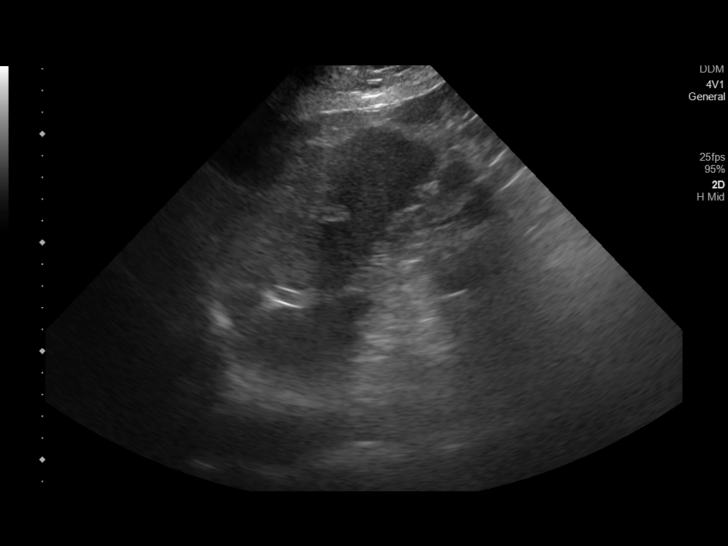
[im 20/40]
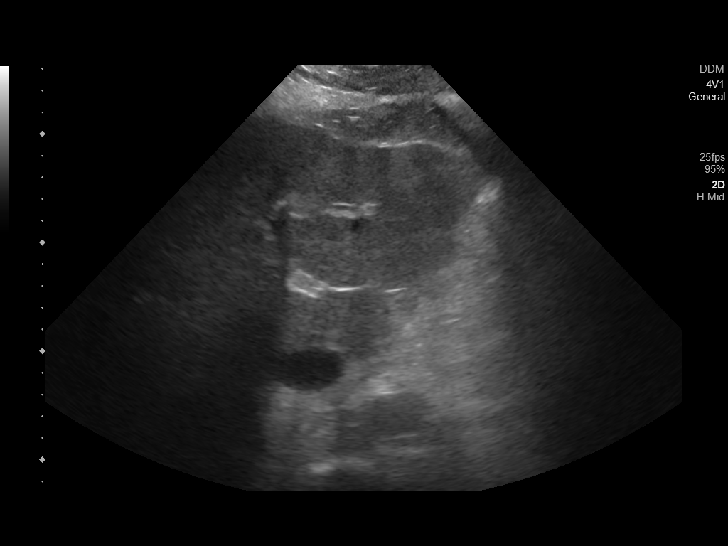
[im 23/40]
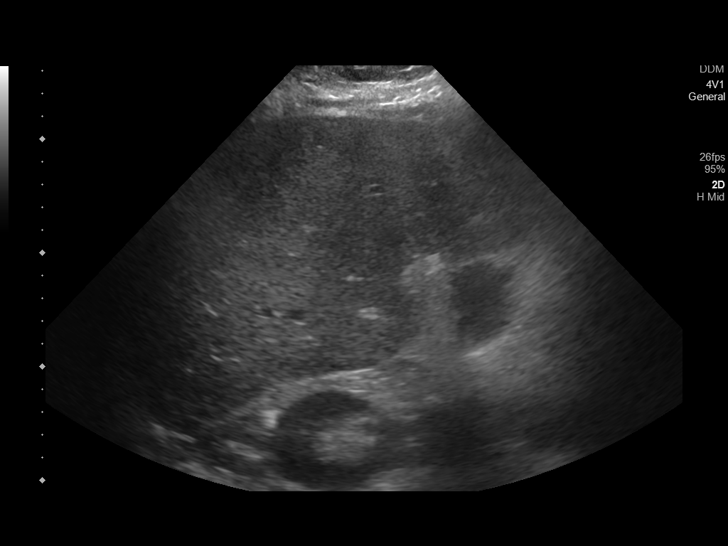
[im 27/40]
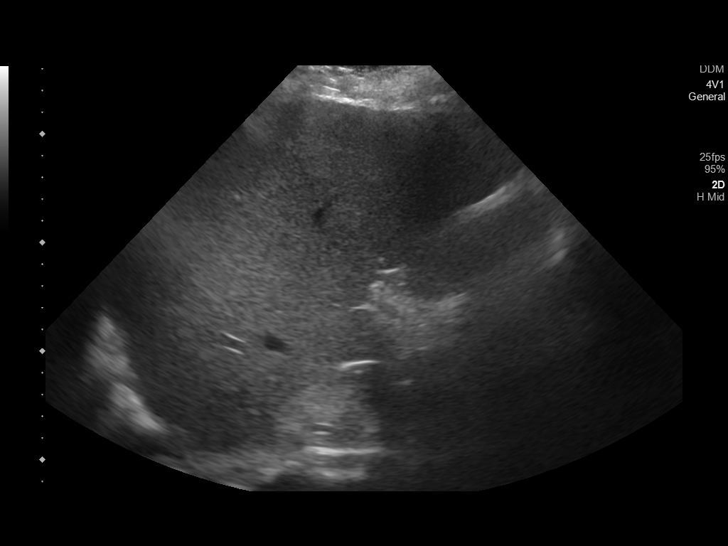
[im 30/40]
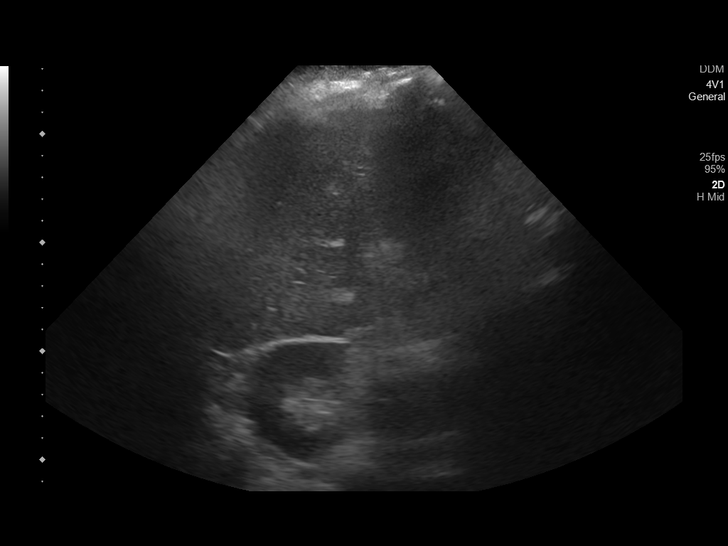
[im 33/40]
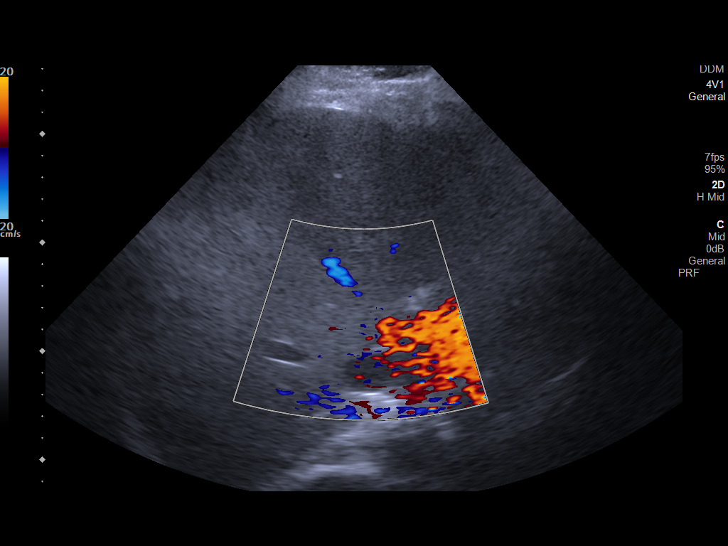
[im 36/40]
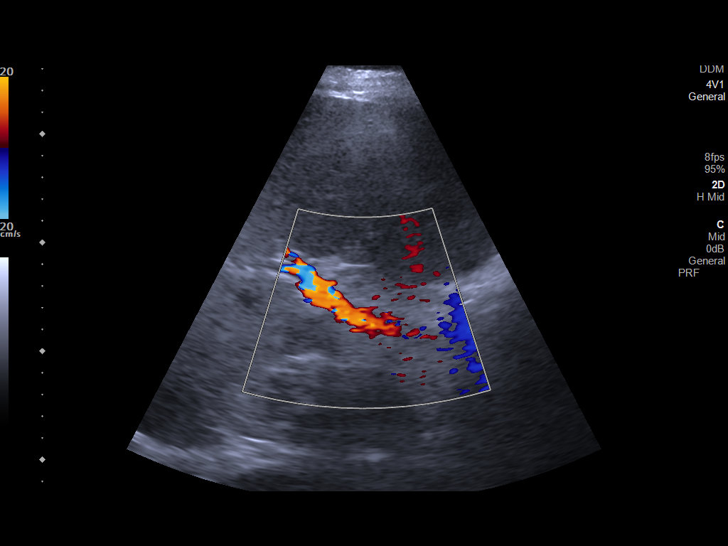
[im 40/40]
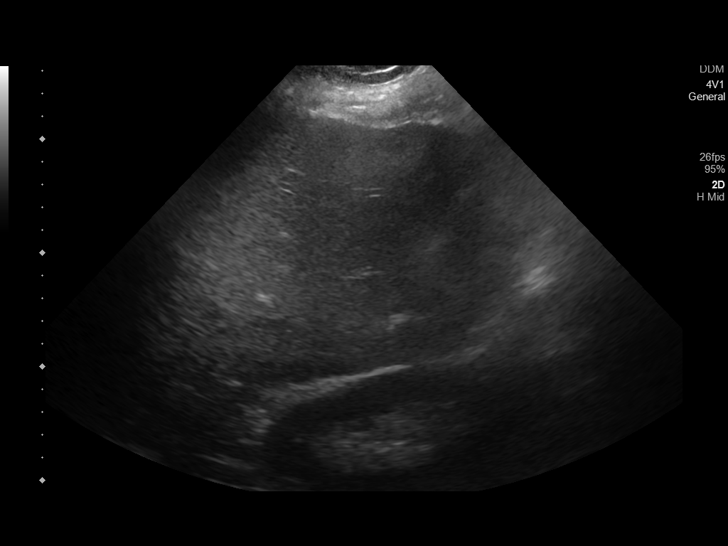

[13 of 25 positions shown; findings below may reference images not displayed]

FINDINGS: Gallbladder:

Normal gallbladder distension. Normal anechoic bile. No visible
calcified gallstones. No gallbladder wall thickening. There is trace
fluid in the right upper quadrant near the gallbladder tip but
without pericholecystic edematous changes. Could reflect small
volume ascites.

Common bile duct:

Diameter: 3.6 mm, nondilated

Liver:

There is mild nodularity of the hepatic surface contour,
particularly along the margins of the left lobe liver (18/42 for
example). No focal liver lesion is seen. Hepatic echogenicity
remains within normal limits. No intrahepatic biliary dilatation.
Portal vein is patent on color Doppler imaging with normal direction
of blood flow towards the liver.

Other: Technically challenging exam due to ventilated status and
patient agitation. Small volume of anechoic free fluid is seen in
the right upper quadrant near the gallbladder though may reflect a
trace volume of ascites.
IMPRESSION: 1. Technically challenging exam due to patient agitation and
ventilated status.
2. Mild nodularity of the hepatic surface contour, can be seen in
the setting of cirrhosis. Correlate with LFTs.
3. Trace fluid in the right upper quadrant near the gallbladder tip.
May reflect a trace volume ascites given the absence of other
sonographic features of acute cholecystitis.

## 2020-09-20 MED ORDER — CLONAZEPAM 1 MG PO TABS
1.0000 mg | ORAL_TABLET | Freq: Two times a day (BID) | ORAL | Status: DC
  Administered 2020-09-20 – 2020-09-24 (×8): 1 mg
  Filled 2020-09-20 (×8): qty 1

## 2020-09-20 MED ORDER — MAGNESIUM SULFATE 2 GM/50ML IV SOLN
2.0000 g | Freq: Once | INTRAVENOUS | Status: AC
  Administered 2020-09-20: 2 g via INTRAVENOUS
  Filled 2020-09-20: qty 50

## 2020-09-20 NOTE — Progress Notes (Signed)
CRITICAL CARE NOTE  30 yo male recent hospitalization for perforated diverticulitis s/p colostomy with reversal and leakage of anastomosis with s/p revision now acutely hypoxemic with COVID19 induced severe ARDS.   INTERVAL EVENTS: 08/11/20-patient continued to decline with worsening respiratory status despite 100%Fio2 on BIPAP and HFNC failure. He stated that he feels he is dying and cannot breathe. I discussed with wife [name] earlier today that patient is critically ill may need ETT, she is agreeable and thankful for care. I specifically discussed and explained intubation and mechanical ventilation to patient and he wishes to proceed.  08/12/20- patient was weaned on FiO2 to 60%. Spoke to wife [name].  08/13/20- Patient weaned to 40%. During SBT today he self extubated and was placed on HFNC. He subsequently became hypoxemic, disoriented aggitated, removed PIVs and colostomy and proceeded to have combative behavior with worsening oxygenation. I have attempted to calm patient and encouraged him to cooperate. After numerous attempts patient continued to be combative with severe aggitation with combative behavior and would not wear oxygen. Patient was placed on sedation and MV. Wife updated.  08/14/20-patient remains critically ill. Weaned from 100%>>55%. Called and updated wife [name] today 12/27-Patient with worsening oxygenation on FiO2 100% peep of 14 08/16/20-Patient with continued high oxygen requirements overnight. Have spoken with surgery and there should be no problem proning patient with this. Was febrile yesterday and thus broad spectrum abx started and cultures attained.  08-17-20-patient down to PEEP 10 FiO2 55%. Patient never required proning 08/18/20:FiO2 weaned to 40%, PEEP 10, plan for diuresis with Diamox today, adding PO Clonazepam to assist weaning sedation 08/19/20: Vent changed to Pressure Control this morning: 50% FiO2, 24, 22/5, Plan for Recruitment maneuvers  and diuresis 08/20/2020:Continued issues with hypercapnia due to the dead space ventilation. Worsening despite pressure control. Switch back to [facility], prone positioning 08/23/19- patient proned today without incident, continue to wean FiO2 , currently on 50% 08/24/19-patient weaned to 40% FiO2, plan to continue proning with full scope of care. I have spoken to [name] (wife of patient today) she would like to be present prior to next extubation attempt to help console patient and decrease his aggitation. 1/21filed weaning trials, ver agitated , severe hypoxia, Wife at bedside witnessed failed SAT. 1/6 severe resp failure, hypoxia 1/7 proning for 16 hrs, severe ARDS 1/8 PICC line placed, Had a brief PEA arrest while prone. Proningdiscontinued 1/10severe resp failure 1/11severe ARDS, [facility] failure 09/02/20-patient with no acute events. Spoke with TJonelle Sidletoday answered questions and plan is for trache.  09/03/20- patient remains on MV. FiO2 down to 35%PRVC plan for trache. 09/04/20- patient with TG>1000 will switch off propofol but patient with very high sedation requirement, will discuss options with pharmD. Plan for trache. Met with wife [name] at bedside. 1/20extubated, wife at bedside, very weak cough, encephalopathy 1/21 re-intubated, severe resp failure [facility] needs [facility], severe resp failure [facility] s/p PEG tube 1/27 unable to do TEE today 1/28 TEE deferred; new anemia (possibly related to recent tracheostomy) 1/30recurrent fever 1/31 remains on vent, failure to wean    CC  follow up respiratory failure  SUBJECTIVE Patient remains critically ill Prognosis is guarded Remains febrile    BP 122/67   Pulse (!) 119   Temp 99.3 F (37.4 C)   Resp (!) 28   Ht 6' 0.01" (1.829 m)   Wt 120.3 kg   SpO2 97%   BMI 35.96 kg/m  I/O last 3 completed shifts: In: 3900.7 [I.V.:410.6;  NG/GT:3040; IV Piggyback:450.1] Out: 2900 [Urine:2100; Drains:250; Stool:550] No intake/output data recorded.  SpO2: 97 % O2 Flow Rate (L/min): 0 L/min FiO2 (%): 30 %  Estimated body mass index is 35.96 kg/m as calculated from the following:   Height as of this encounter: 6' 0.01" (1.829 m).   Weight as of this encounter: 120.3 kg.  SIGNIFICANT EVENTS   REVIEW OF SYSTEMS  PATIENT IS UNABLE TO PROVIDE COMPLETE REVIEW OF SYSTEMS DUE TO SEVERE CRITICAL ILLNESS   Pressure Injury 09/17/20 Coccyx Medial Stage 2 -  Partial thickness loss of dermis presenting as a shallow open injury with a red, pink wound bed without slough. (Active)  09/17/20 1010  Location: Coccyx  Location Orientation: Medial  Staging: Stage 2 -  Partial thickness loss of dermis presenting as a shallow open injury with a red, pink wound bed without slough.  Wound Description (Comments):   Present on Admission:       PHYSICAL EXAMINATION:  GENERAL:critically ill appearing, +resp distress HEAD: Normocephalic, atraumatic.  EYES: Pupils equal, round, reactive to light.  No scleral icterus.  MOUTH: Moist mucosal membrane. NECK: Supple. S/p trach PULMONARY: +rhonchi, +wheezing CARDIOVASCULAR: S1 and S2. Regular rate and rhythm. No murmurs, rubs, or gallops.  GASTROINTESTINAL: Soft, nontender, -distended.  Positive bowel sounds. S/p PEG   MUSCULOSKELETAL: No swelling, clubbing, or edema.  NEUROLOGIC: obtunded, GCS<8 SKIN:intact,warm,dry  MEDICATIONS: I have reviewed all medications and confirmed regimen as documented   CULTURE RESULTS   Recent Results (from the past 240 hour(s))  CULTURE, BLOOD (ROUTINE X 2) w Reflex to ID Panel     Status: None (Preliminary result)   Collection Time: 09/16/20  9:46 AM   Specimen: BLOOD LEFT HAND  Result Value Ref Range Status   Specimen Description BLOOD LEFT HAND  Final   Special Requests   Final    BOTTLES DRAWN AEROBIC ONLY Blood Culture results may not be optimal  due to an inadequate volume of blood received in culture bottles   Culture   Final    NO GROWTH 3 DAYS Performed at [facility], [address], [place], [place] [address]    Report Status PENDING  Incomplete  CULTURE, BLOOD (ROUTINE X 2) w Reflex to ID Panel     Status: None (Preliminary result)   Collection Time: 09/16/20  9:52 AM   Specimen: Joint, Finger; Blood  Result Value Ref Range Status   Specimen Description FINGER LEFT BLOOD  Final   Special Requests   Final    BOTTLES DRAWN AEROBIC AND ANAEROBIC Blood Culture adequate volume   Culture   Final    NO GROWTH 3 DAYS Performed at [facility], [address], [place], [place] [address]    Report Status PENDING  Incomplete  Culture, respiratory (non-expectorated)     Status: None   Collection Time: 09/16/20 12:20 PM   Specimen: Tracheal Aspirate; Respiratory  Result Value Ref Range Status   Specimen Description   Final    TRACHEAL ASPIRATE Performed at [facility], [address], [place], [place] [address]    Special Requests   Final    NONE Performed at [facility], [address] [place], [place] [address]    Gram Stain   Final    RARE WBC PRESENT,BOTH PMN AND MONONUCLEAR MODERATE GRAM POSITIVE COCCI IN PAIRS Performed at [facility], [place] [address] [place], [place] [address]    Culture  MODERATE STAPHYLOCOCCUS AUREUS  Final   Report Status 09/18/2020 FINAL  Final   Organism ID, Bacteria STAPHYLOCOCCUS AUREUS  Final      Susceptibility   Staphylococcus aureus - MIC*    CIPROFLOXACIN <=0.5 SENSITIVE Sensitive     ERYTHROMYCIN >=8 RESISTANT Resistant     GENTAMICIN <=0.5 SENSITIVE Sensitive     OXACILLIN 0.5 SENSITIVE Sensitive     TETRACYCLINE <=1 SENSITIVE Sensitive     VANCOMYCIN <=0.5 SENSITIVE Sensitive     TRIMETH/SULFA <=10 SENSITIVE Sensitive     CLINDAMYCIN RESISTANT Resistant     RIFAMPIN <=0.5 SENSITIVE Sensitive     Inducible Clindamycin POSITIVE  Resistant     * MODERATE STAPHYLOCOCCUS AUREUS  Aerobic Culture (superficial specimen)     Status: None (Preliminary result)   Collection Time: 09/16/20  2:23 PM   Specimen: Skin, Cyst/Tag/Debridement; Wound  Result Value Ref Range Status   Specimen Description   Final    SKIN Performed at [facility], [address] [place], [place] [address]    Special Requests   Final    NONE Performed at [facility], [address] [place], [place] [address]    Gram Stain   Final    NO WBC SEEN MODERATE GRAM POSITIVE COCCI IN PAIRS FEW GRAM NEGATIVE RODS    Culture   Final    MODERATE STAPHYLOCOCCUS AUREUS SUSCEPTIBILITIES TO FOLLOW Performed at [facility], [place] [address] [place], [place] [address]    Report Status PENDING  Incomplete  MRSA PCR Screening     Status: None   Collection Time: 09/18/20  4:50 AM   Specimen: Nasopharyngeal  Result Value Ref Range Status   MRSA by PCR NEGATIVE NEGATIVE Final    Comment:        The GeneXpert MRSA Assay (FDA approved for NASAL specimens only), is one component of a comprehensive MRSA colonization surveillance program. It is not intended to diagnose MRSA infection nor to guide or monitor treatment for MRSA infections. Performed at [facility], [address], [place],  [address]           IMAGING    No results found.   Nutrition Status: Nutrition Problem: Increased nutrient needs Etiology: catabolic illness (PJASN-05) Signs/Symptoms: estimated needs Interventions: Refer to RD note for recommendations     Indwelling Urinary Catheter continued, requirement due to   Reason to continue Indwelling Urinary Catheter strict Intake/Output monitoring for hemodynamic instability   Central Line/ continued, requirement due to  Reason to continue Venus of central venous pressure or other hemodynamic parameters and poor IV access   Ventilator continued, requirement due  to severe respiratory failure   Ventilator Sedation RASS 0 to -2      ASSESSMENT AND PLAN SYNOPSIS   30 yo male recent hospitalization for perforated diverticulitis s/p colostomy with reversal and leakage of anastomosis with s/p revision now acutely hypoxemic with COVID19 induced severe ARDS.failure to wean from VENT-self extubation and trial of extubation failed due to Severe encephalopathy leading to inability to protect airway. Tracheostomy performed 1/25. PEG placed 1/26 Plan for transfer to [facility] when bed is available.  PATIENT IS STABLE TO TRANSFER TO LTACH  Severe ACUTE Hypoxic and Hypercapnic Respiratory Failure -continue Full MV support -continue Bronchodilator Therapy -Wean Fio2 and PEEP as tolerated -will perform SAT/SBT when respiratory parameters are met Wean to PS mode -VAP/VENT bundle implementation  ACUTE DIASTOLIC CARDIAC FAILURE-  -oxygen as needed -Lasix as tolerated  Morbid obesity, possible OSA.   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(cardiac arrest Jan 8th)    CARDIAC ICU monitoring  INFECTIOUS DISEASE -continue antibiotics as prescribed -follow up cultures -follow up ID consultation    GI GI PROPHYLAXIS as indicated   DIET-->TF's as tolerated Constipation protocol as indicated  ENDO - will use ICU hypoglycemic\Hyperglycemia protocol if indicated     ELECTROLYTES -follow labs as needed -replace as needed -pharmacy consultation and following   DVT/GI PRX ordered and assessed TRANSFUSIONS AS NEEDED MONITOR FSBS I Assessed the need for Labs I Assessed the need for Foley I Assessed the need for Central Venous Line Family Discussion when available I Assessed the need for Mobilization I made an Assessment of medications to be adjusted accordingly Safety Risk assessment  completed   CASE DISCUSSED IN MULTIDISCIPLINARY ROUNDS WITH ICU TEAM  Critical Care Time devoted to patient care services described in this note is 55 minutes.   Overall, patient is critically ill, prognosis is guarded.    Corrin Parker, M.D.  [facility]  Medical Director [place] Director [facility] Cardio-Pulmonary Department

## 2020-09-20 NOTE — Progress Notes (Signed)
Date of Admission:  08/09/2020      ID: Craig [name] is a 30 y.o. male  Principal Problem:   Acute hypoxemic respiratory failure due to COVID-19 [facility]) Active Problems:   Severe sepsis (HCC)   AKI (acute kidney injury) ([place])   Hypertension   Fever   Metabolic encephalopathy   Staphylococcus aureus bacteremia   Pressure injury of skin   Antibiotic history Ceftriaxone 12/21>>12/26 Azithromycin 12/21>>12/23 Zosyn 12/27>>12/31 Vanco 12/27-1 dose Linezolid 12/28>>12/31 08/20/2020>>09/05/20 no antibiotics 1/18 one dose of cefepime 1/18 cefazolin>>  Micro 12/21 BC-NG 12/21 UC-NG 12/27 BC NG 1/18 BC- MSSA 1/18 Resp culture MSSA 09/16/20- BC -NG 09/16/20 Resp culture-MSSA Tracheal stoma - MSSA  LDA 08/13/20>>09/13/20- ETT Tracheostomy 09/10/20 08/27/20>>1/19/22PICC-rt 08/25/20 Foley PICC 09/13/20 PEG 09/14/20 Colostomy  Subjective: None available  Medications:  . sodium chloride   Intravenous Once  . allopurinol  300 mg Per Tube Daily  . carvedilol  25 mg Per Tube BID  . chlorhexidine gluconate (MEDLINE KIT)  15 mL Mouth Rinse BID  . Chlorhexidine Gluconate Cloth  6 each Topical Daily  . clonazePAM  1 mg Per Tube BID  . clotrimazole-betamethasone   Topical BID  . doxazosin  1 mg Per Tube Daily  . feeding supplement (PROSource TF)  45 mL Per Tube TID  . insulin aspart  0-15 Units Subcutaneous Q4H  . insulin aspart  3 Units Subcutaneous Q4H  . linezolid  600 mg Per Tube Q12H  . mouth rinse  15 mL Mouth Rinse 10 times per day  . oxyCODONE  10 mg Per Tube Q6H  . pantoprazole (PROTONIX) IV  40 mg Intravenous Q12H  . sodium chloride flush  10-40 mL Intracatheter Q12H  . valproic acid  250 mg Per Tube BID    Objective: Vital signs in last 24 hours: Temp:  [99.3 F (37.4 C)-100.8 F (38.2 C)] 99.5 F (37.5 C) (02/01 1300) Pulse Rate:  [101-136] 112 (02/01 1300) Resp:  [20-37] 36 (02/01 1300) BP: (97-148)/(51-92) 121/66 (02/01 1300) SpO2:  [88 %-99 %] 98  % (02/01 1435) FiO2 (%):  [30 %-40 %] 40 % (02/01 1435) Weight:  [120.3 kg] 120.3 kg (02/01 0500)  .vs  PHYSICAL EXAM:  General: Awake , mouthing words, no distress, .  Head: Normocephalic, without obvious abnormality, atraumatic. Eyes: Conjunctivae clear, anicteric sclerae. Pupils are equal ENT Nares normal. No drainage or sinus tenderness. Lips, mucosa, and tongue normal. No Thrush trachestomy Neck: Supple, symmetrical, no adenopathy, thyroid: non tender no carotid bruit and no JVD. Back: No CVA tenderness. Lungs:b/l air entry Heart: s1s2 Abdomen: Soft, peg, colostomy Extremities:edematous Skin: No rashes or lesions. Or bruising Lymph: Cervical, supraclavicular normal. Neurologic: cannot be assessed  Lab Results Recent Labs    09/18/20 0444 09/19/20 0410 09/20/20 0430  WBC 7.0 7.9  --   HGB 7.6* 7.7*  --   HCT 25.8* 25.1*  --   NA 139 139 139  K 3.8 3.5 4.2  CL 96* 96* 96*  CO2 33* 34* 32  BUN '8 6 7  ' CREATININE <0.30* <0.30* <0.30*   Liver Panel Recent Labs    09/18/20 0444  PROT 5.6*  ALBUMIN 1.9*  AST 16  ALT 11  ALKPHOS 70  BILITOT 0.4   Sedimentation Rate Recent Labs    09/18/20 1135  ESRSEDRATE 126*   C-Reactive Protein Recent Labs    09/18/20 1136  CRP 17.3*     Assessment/Plan: Acute hypoxic respiratory failure due to Covid.  Has tracheostomy and  is on the vent.  Fever for the past few days. Repeat blood cultures negative Tracheal aspirate MSSA.  Foley catheter was removed but has been replaced.  Yesterday cefazolin was changed to linezolid.  If no resolution of fever may do further cultures  MSSA bacteremia and MSSA multifocal pneumonia.  Was on cefazolin but because of questionable drug fever changed to linezolid.  Anemia  Hypoalbuminemia  Discussed the management with his nurse.

## 2020-09-20 NOTE — Progress Notes (Signed)
[place] for Electrolyte Monitoring and Replacement   Recent Labs: Potassium (mmol/L)  Date Value  09/20/2020 4.2   Magnesium (mg/dL)  Date Value  09/20/2020 1.6 (L)   Calcium (mg/dL)  Date Value  09/20/2020 8.1 (L)   Albumin (g/dL)  Date Value  09/18/2020 1.9 (L)   Phosphorus (mg/dL)  Date Value  09/19/2020 3.4   Sodium (mmol/L)  Date Value  09/20/2020 139    Assessment: 30 yo male admitted with perforated diverticulitis and severe acute hypoxemic respiratory failure secondary to COVID-19 infection. Patient has severe ARDs and was reintubated 1/21. Pharmacy has been consulted for electrolyte monitoring and replacement.   Patient is status post tracheostomy 1/25 and PEG 1/26. Nutrition provided via tube feeds. Patient is receiving intermittent diuresis.   Goal of Therapy:  Electrolytes WNL   Plan:  Magnesium 2 g IV x 1. All other electrolytes stable.  Pharmacy will continue to follow and replace electrolytes as needed.   Tawnya Crook, PharmD 09/20/2020 11:50 AM

## 2020-09-21 DIAGNOSIS — N179 Acute kidney failure, unspecified: Secondary | ICD-10-CM | POA: Diagnosis not present

## 2020-09-21 DIAGNOSIS — J9601 Acute respiratory failure with hypoxia: Secondary | ICD-10-CM | POA: Diagnosis not present

## 2020-09-21 DIAGNOSIS — U071 COVID-19: Secondary | ICD-10-CM | POA: Diagnosis not present

## 2020-09-21 DIAGNOSIS — G9341 Metabolic encephalopathy: Secondary | ICD-10-CM | POA: Diagnosis not present

## 2020-09-21 DIAGNOSIS — R7881 Bacteremia: Secondary | ICD-10-CM | POA: Diagnosis not present

## 2020-09-21 DIAGNOSIS — B9561 Methicillin susceptible Staphylococcus aureus infection as the cause of diseases classified elsewhere: Secondary | ICD-10-CM | POA: Diagnosis not present

## 2020-09-21 LAB — MAGNESIUM: Magnesium: 1.6 mg/dL — ABNORMAL LOW (ref 1.7–2.4)

## 2020-09-21 LAB — CBC WITH DIFFERENTIAL/PLATELET
Abs Immature Granulocytes: 0.35 10*3/uL — ABNORMAL HIGH (ref 0.00–0.07)
Basophils Absolute: 0 10*3/uL (ref 0.0–0.1)
Basophils Relative: 0 %
Eosinophils Absolute: 0.1 10*3/uL (ref 0.0–0.5)
Eosinophils Relative: 1 %
HCT: 24.5 % — ABNORMAL LOW (ref 39.0–52.0)
Hemoglobin: 7.4 g/dL — ABNORMAL LOW (ref 13.0–17.0)
Immature Granulocytes: 5 %
Lymphocytes Relative: 20 %
Lymphs Abs: 1.4 10*3/uL (ref 0.7–4.0)
MCH: 27.8 pg (ref 26.0–34.0)
MCHC: 30.2 g/dL (ref 30.0–36.0)
MCV: 92.1 fL (ref 80.0–100.0)
Monocytes Absolute: 0.9 10*3/uL (ref 0.1–1.0)
Monocytes Relative: 12 %
Neutro Abs: 4.5 10*3/uL (ref 1.7–7.7)
Neutrophils Relative %: 62 %
Platelets: 246 10*3/uL (ref 150–400)
RBC: 2.66 MIL/uL — ABNORMAL LOW (ref 4.22–5.81)
RDW: 17.8 % — ABNORMAL HIGH (ref 11.5–15.5)
WBC: 7.3 10*3/uL (ref 4.0–10.5)
nRBC: 0.3 % — ABNORMAL HIGH (ref 0.0–0.2)

## 2020-09-21 LAB — GLUCOSE, CAPILLARY
Glucose-Capillary: 82 mg/dL (ref 70–99)
Glucose-Capillary: 92 mg/dL (ref 70–99)
Glucose-Capillary: 113 mg/dL — ABNORMAL HIGH (ref 70–99)
Glucose-Capillary: 115 mg/dL — ABNORMAL HIGH (ref 70–99)
Glucose-Capillary: 123 mg/dL — ABNORMAL HIGH (ref 70–99)
Glucose-Capillary: 139 mg/dL — ABNORMAL HIGH (ref 70–99)

## 2020-09-21 LAB — CULTURE, BLOOD (ROUTINE X 2)
Culture: NO GROWTH
Culture: NO GROWTH
Special Requests: ADEQUATE

## 2020-09-21 LAB — RENAL FUNCTION PANEL
Albumin: 1.8 g/dL — ABNORMAL LOW (ref 3.5–5.0)
Anion gap: 10 (ref 5–15)
BUN: 6 mg/dL (ref 6–20)
CO2: 32 mmol/L (ref 22–32)
Calcium: 8.2 mg/dL — ABNORMAL LOW (ref 8.9–10.3)
Chloride: 96 mmol/L — ABNORMAL LOW (ref 98–111)
Creatinine, Ser: <0.3 mg/dL — ABNORMAL LOW (ref 0.61–1.24)
Glucose, Bld: 110 mg/dL — ABNORMAL HIGH (ref 70–99)
Phosphorus: 4.7 mg/dL — ABNORMAL HIGH (ref 2.5–4.6)
Potassium: 4.1 mmol/L (ref 3.5–5.1)
Sodium: 138 mmol/L (ref 135–145)

## 2020-09-21 MED ORDER — LACTATED RINGERS IV BOLUS
1000.0000 mL | Freq: Once | INTRAVENOUS | Status: AC
  Administered 2020-09-21: 1000 mL via INTRAVENOUS

## 2020-09-21 MED ORDER — PHENYLEPHRINE HCL-NACL 10-0.9 MG/250ML-% IV SOLN
0.0000 ug/min | INTRAVENOUS | Status: DC
  Administered 2020-09-21 (×2): 20 ug/min via INTRAVENOUS
  Administered 2020-09-22 (×2): 60 ug/min via INTRAVENOUS
  Filled 2020-09-21 (×6): qty 250

## 2020-09-21 MED ORDER — ALTEPLASE 2 MG IJ SOLR
2.0000 mg | Freq: Once | INTRAMUSCULAR | Status: AC
  Administered 2020-09-21: 2 mg

## 2020-09-21 MED ORDER — FENTANYL 75 MCG/HR TD PT72
1.0000 | MEDICATED_PATCH | TRANSDERMAL | Status: DC
  Administered 2020-09-21 – 2020-09-24 (×2): 1 via TRANSDERMAL
  Filled 2020-09-21 (×2): qty 1

## 2020-09-21 MED ORDER — MIDAZOLAM HCL 2 MG/2ML IJ SOLN
2.0000 mg | Freq: Once | INTRAMUSCULAR | Status: AC
  Administered 2020-09-21: 2 mg via INTRAVENOUS
  Filled 2020-09-21: qty 2

## 2020-09-21 MED ORDER — DEXMEDETOMIDINE HCL IN NACL 400 MCG/100ML IV SOLN
0.4000 ug/kg/h | INTRAVENOUS | Status: DC
  Administered 2020-09-21: 0.4 ug/kg/h via INTRAVENOUS
  Filled 2020-09-21: qty 100

## 2020-09-21 MED ORDER — MAGNESIUM SULFATE 4 GM/100ML IV SOLN
4.0000 g | Freq: Once | INTRAVENOUS | Status: AC
  Administered 2020-09-21: 4 g via INTRAVENOUS
  Filled 2020-09-21: qty 100

## 2020-09-21 MED ORDER — ALTEPLASE 2 MG IJ SOLR
2.0000 mg | Freq: Once | INTRAMUSCULAR | Status: DC
  Filled 2020-09-21: qty 2

## 2020-09-21 NOTE — TOC Progression Note (Addendum)
Transition of Care [facility]) - Progression Note    Patient Details  Name: PAVEL GADD MRN: [id number] Date of Birth: 12/22/1990  Transition of Care [facility]) CM/SW [place], [place] Phone Number: 09/21/2020, 9:26 AM  Clinical Narrative:     CSW left insurance appeal paperwork for Dr. Mortimer Fries to sign at ICU Secretary's desk.  Updated Dr. Mortimer Fries.  Update:  Dr. Mortimer Fries signed paperwork and Wells Guiles RN from [facility] picked up signed paperwork.        Expected Discharge Plan and Services                                                 Social Determinants of Health (SDOH) Interventions    Readmission Risk Interventions No flowsheet data found.

## 2020-09-21 NOTE — Progress Notes (Signed)
Pt MAP 56. MD made aware, per MD stop precedex. Sedation turned off. Pt drowsy but awakens to pain and follows commands briefly. Will recycle BP more frequently and monitor.

## 2020-09-21 NOTE — Progress Notes (Signed)
ID  Antibiotic history Ceftriaxone 12/21>>12/26 Azithromycin 12/21>>12/23 Zosyn 12/27>>12/31 Vanco 12/27-1 dose Linezolid 12/28>>12/31 08/20/2020>>09/05/20 no antibiotics 1/18 one dose of cefepime 1/18 cefazolin>>  Micro 12/21 BC-NG 12/21 UC-NG 12/27 BC NG 1/18 BC- MSSA 1/18 Resp culture MSSA 09/16/20- BC-NG 09/16/20 Resp culture-MSSA Tracheal stoma - MSSA  LDA 08/13/20>>09/13/20- ETT Tracheostomy 09/10/20 08/27/20>>1/19/22PICC-rt 08/25/20 Foley PICC 09/13/20 PEG 09/14/20 Colostomy  Subjective : none available Objective : Pt unresponsive and hypotensive due to precedex which has been discontinued has tracheostomy On the vent BP (!) 98/51   Pulse (!) 119   Temp (!) 100.6 F (38.1 C)   Resp (!) 31   Ht 6' 0.01" (1.829 m)   Wt 120.3 kg   SpO2 96%   BMI 35.96 kg/m   Chest b/l air entry Hss1s2 rachycardia  'BAck stage 2- sacrum- not infected   Abd soft- PEG, colostomy CNS cannot be assessed  Labs CBC Latest Ref Rng & Units 09/21/2020 09/19/2020 09/18/2020  WBC 4.0 - 10.5 K/uL 7.3 7.9 7.0  Hemoglobin 13.0 - 17.0 g/dL 7.4(L) 7.7(L) 7.6(L)  Hematocrit 39.0 - 52.0 % 24.5(L) 25.1(L) 25.8(L)  Platelets 150 - 400 K/uL 246 154 127(L)   CMP Latest Ref Rng & Units 09/21/2020 09/20/2020 09/19/2020  Glucose 70 - 99 mg/dL 110(H) 172(H) 186(H)  BUN 6 - 20 mg/dL 6 7 6   Creatinine 0.61 - 1.24 mg/dL <0.30(L) <0.30(L) <0.30(L)  Sodium 135 - 145 mmol/L 138 139 139  Potassium 3.5 - 5.1 mmol/L 4.1 4.2 3.5  Chloride 98 - 111 mmol/L 96(L) 96(L) 96(L)  CO2 22 - 32 mmol/L 32 32 34(H)  Calcium 8.9 - 10.3 mg/dL 8.2(L) 8.1(L) 8.3(L)  Total Protein 6.5 - 8.1 g/dL - - -  Total Bilirubin 0.3 - 1.2 mg/dL - - -  Alkaline Phos 38 - 126 U/L - - -  AST 15 - 41 U/L - - -  ALT 0 - 44 U/L - - -   Assessment plan Acute hypoxic respiratory failure due to Covid.  On the vent ,has tracheostomy.  Low-grade fever for the past few days.  No leukocytosis repeat blood cultures negative Tracheal aspirate  MSSA currently on linezolid Would recommend getting a cortisol level to make sure there is no adrenal insufficiency as patient at the beginning of the admission had been on steroids.  MSSA bacteremia MSSA multifocal pneumonia.  On appropriate antibiotics since September 06, 2020.  Because TEE could not be done we will need to treat him for at least minimum 4 to 6 weeks of IV antibiotics. Currently patient is on linezolid as there was a concern that he could have cefazolin drug fever but doubt that to be the case.  We will change to cefazolin soon.  Anemia  Hypoalbuminemia  Discussed the management with the care team.

## 2020-09-21 NOTE — Progress Notes (Signed)
Nutrition Follow-up  DOCUMENTATION CODES:   Obesity unspecified  INTERVENTION:  Continue Vital 1.5 Cal at 80 mL/hr (1920 mL goal daily volume) + PROSource TF 45 mL TID per tube. Provides 3000 kcal, 163 grams of protein, 1459 mL H2O daily.  With free water flush of 30 mL Q4hrs patient receiving a total of 1639 mL H2O daily including water in TF regimen.  NUTRITION DIAGNOSIS:   Increased nutrient needs related to catabolic illness (FHQRF-75) as evidenced by estimated needs.  Ongoing.  GOAL:   Provide needs based on ASPEN/SCCM guidelines  Met with TF regimen.  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 Pt developed worsening respiratory failure 12/23 requiring intubation  12/25 intubated 1/20 extubated 1/21 reintubated 1/25 s/p tracheostomy tube placement 1/26 s/p PEG tube placement  Patient resting with eyes closed at time of RD assessment. Tube feeds infusing at goal rate and patient tolerating. Patient receiving free water flush of 30 ml Q4hrs. Pending disposition plan.  Patient is currently on ventilator support via trach MV: 11.5 L/min Temp (24hrs), Avg:99.9 F (37.7 C), Min:98.2 F (36.8 C), Max:100.9 F (38.3 C)  Medications reviewed and include: allopurinol, fentanyl patch, Novolog 0-15 units Q4hrs, Novolog 3 units Q4hrs, linezolid, oxycodone, Protonix, Precedex gtt, fentanyl gtt.  Labs reviewed: CBG 82-134, Chloride 96, Creatinine <0.3, Phosphorus 4.6, Magnesium 1.6.  I/O: 2230 mL UOP yesterday (0.8 mL/kg/hr)  Weight trend: 120.3 kg on 2/2; +2.4 kg from 12/21  Enteral Access: 20 Fr. G-tube  TF regimen: Vital 1.5 Cal at 80 mL/hr +  PROSource TF 45 mL TID per tube  Discussed on rounds.  Diet Order:   Diet Order            Diet NPO time specified  Diet effective now                EDUCATION NEEDS:   No education needs have been identified at this time  Skin:  Skin Assessment: Skin Integrity Issues: Skin Integrity Issues:: Stage II Stage II: coccyx (3cm x 1cm) Other: N/A  Last BM:  09/21/2020 - 300 mL via ostomy  Height:   Ht Readings from Last 1 Encounters:  09/08/20 6' 0.01" (1.829 m)   Weight:   Wt Readings from Last 1 Encounters:  09/21/20 120.3 kg   Ideal Body Weight:  80.9 kg  BMI:  Body mass index is 35.96 kg/m.  Estimated Nutritional Needs:   Kcal:  2800-3100kcal/day  Protein:  >160g/day  Fluid:  2.4-2.7L/day  Jacklynn Barnacle, MS, RD, LDN Pager number available on Amion

## 2020-09-21 NOTE — Progress Notes (Signed)
Previous PRVC settings resumed due to patients low blood pressure and the need to be re-sedated.

## 2020-09-21 NOTE — Progress Notes (Signed)
CRITICAL CARE NOTE  30 yo male recent hospitalization for perforated diverticulitis s/p colostomy with reversal and leakage of anastomosis with s/p revision now acutely hypoxemic with COVID19 induced severe ARDS.   INTERVAL EVENTS: 08/11/20-patient continued to decline with worsening respiratory status despite 100%Fio2 on BIPAP and HFNC failure. He stated that he feels he is dying and cannot breathe. I discussed with wife [name] earlier today that patient is critically ill may need ETT, she is agreeable and thankful for care. I specifically discussed and explained intubation and mechanical ventilation to patient and he wishes to proceed.  08/12/20- patient was weaned on FiO2 to 60%. Spoke to wife [name].  08/13/20- Patient weaned to 40%. During SBT today he self extubated and was placed on HFNC. He subsequently became hypoxemic, disoriented aggitated, removed PIVs and colostomy and proceeded to have combative behavior with worsening oxygenation. I have attempted to calm patient and encouraged him to cooperate. After numerous attempts patient continued to be combative with severe aggitation with combative behavior and would not wear oxygen. Patient was placed on sedation and MV. Wife updated.  08/14/20-patient remains critically ill. Weaned from 100%>>55%. Called and updated wife [name] today 12/27-Patient with worsening oxygenation on FiO2 100% peep of 14 08/16/20-Patient with continued high oxygen requirements overnight. Have spoken with surgery and there should be no problem proning patient with this. Was febrile yesterday and thus broad spectrum abx started and cultures attained.  08-17-20-patient down to PEEP 10 FiO2 55%. Patient never required proning 08/18/20:FiO2 weaned to 40%, PEEP 10, plan for diuresis with Diamox today, adding PO Clonazepam to assist weaning sedation 08/19/20: Vent changed to Pressure Control this morning: 50% FiO2, 24, 22/5, Plan for Recruitment maneuvers  and diuresis 08/20/2020:Continued issues with hypercapnia due to the dead space ventilation. Worsening despite pressure control. Switch back to [facility], prone positioning 08/23/19- patient proned today without incident, continue to wean FiO2 , currently on 50% 08/24/19-patient weaned to 40% FiO2, plan to continue proning with full scope of care. I have spoken to [name] (wife of patient today) she would like to be present prior to next extubation attempt to help console patient and decrease his aggitation. 1/28filed weaning trials, ver agitated , severe hypoxia, Wife at bedside witnessed failed SAT. 1/6 severe resp failure, hypoxia 1/7 proning for 16 hrs, severe ARDS 1/8 PICC line placed, Had a brief PEA arrest while prone. Proningdiscontinued 1/10severe resp failure 1/11severe ARDS, [facility] 09/02/20-patient with no acute events. Spoke with TJonelle Sidletoday answered questions and plan is for trache.  09/03/20- patient remains on MV. FiO2 down to 35%PRVC plan for trache. 09/04/20- patient with TG>1000 will switch off propofol but patient with very high sedation requirement, will discuss options with pharmD. Plan for trache. Met with wife [name] at bedside. 1/20extubated, wife at bedside, very weak cough, encephalopathy 1/21 re-intubated, severe resp failure [facility], [facility] 1/27 unable to do TEE today 1/28 TEE deferred; new anemia (possibly related to recent tracheostomy) 1/30recurrent fever 1/31 remains on vent, failure to wean 2/1 remains febrile, severe agitation   CC  follow up respiratory failure  HPI Patient remains ill Prognosis is guarded Remains febrile    BP 117/67   Pulse (!) 137   Temp 98.2 F (36.8 C)   Resp (!) 28   Ht 6' 0.01" (1.829 m)   Wt 120.3 kg   SpO2 97%   BMI 35.96 kg/m  I/O last 3 completed shifts: In: 2187  [I.V.:697; NG/GT:1440; IV Piggyback:50] Out: 7169 [Urine:2580; Stool:700] No intake/output data recorded.  SpO2: 97 % O2 Flow Rate (L/min): 0 L/min FiO2 (%): 40 %  Estimated body mass index is 35.96 kg/m as calculated from the following:   Height as of this encounter: 6' 0.01" (1.829 m).   Weight as of this encounter: 120.3 kg.  REVIEW OF SYSTEMS  PATIENT IS UNABLE TO PROVIDE COMPLETE REVIEW OF SYSTEM S DUE TO SEVERE ENCEPHALOPATHY     Pressure Injury 09/17/20 Coccyx Medial Stage 2 -  Partial thickness loss of dermis presenting as a shallow open injury with a red, pink wound bed without slough. (Active)  09/17/20 1010  Location: Coccyx  Location Orientation: Medial  Staging: Stage 2 -  Partial thickness loss of dermis presenting as a shallow open injury with a red, pink wound bed without slough.  Wound Description (Comments):   Present on Admission:     PHYSICAL EXAMINATION:  GENERAL: ill appearing, +resp distress obese HEAD: Normocephalic, atraumatic.  EYES: Pupils equal, round, reactive to light.  No scleral icterus.  MOUTH: Moist mucosal membrane. NECK: Supple. No thyromegaly. No nodules. No JVD.  PULMONARY: +rhonchi, +wheezing CARDIOVASCULAR: S1 and S2. Regular rate and rhythm. No murmurs, rubs, or gallops.  GASTROINTESTINAL: Soft, nontender, -distended. Positive bowel sounds.  MUSCULOSKELETAL: +edema.  NEUROLOGIC: obtunded SKIN:intact,warm,dry  MEDICATIONS: I have reviewed all medications and confirmed regimen as documented   CULTURE RESULTS   Recent Results (from the past 240 hour(s))  CULTURE, BLOOD (ROUTINE X 2) w Reflex to ID Panel     Status: None   Collection Time: 09/16/20  9:46 AM   Specimen: BLOOD LEFT HAND  Result Value Ref Range Status   Specimen Description BLOOD LEFT HAND  Final   Special Requests   Final    BOTTLES DRAWN AEROBIC ONLY Blood Culture results may not be optimal due to an inadequate volume of blood received in culture bottles    Culture   Final    NO GROWTH 5 DAYS Performed at [facility], [address] [place], [place] [address]    Report Status 09/21/2020 FINAL  Final  CULTURE, BLOOD (ROUTINE X 2) w Reflex to ID Panel     Status: None   Collection Time: 09/16/20  9:52 AM   Specimen: Joint, Finger; Blood  Result Value Ref Range Status   Specimen Description FINGER LEFT BLOOD  Final   Special Requests   Final    BOTTLES DRAWN AEROBIC AND ANAEROBIC Blood Culture adequate volume   Culture   Final    NO GROWTH 5 DAYS Performed at [facility], [address] [place], [place] [address]    Report Status 09/21/2020 FINAL  Final  Culture, respiratory (non-expectorated)     Status: None   Collection Time: 09/16/20 12:20 PM   Specimen: Tracheal Aspirate; Respiratory  Result Value Ref Range Status   Specimen Description   Final    TRACHEAL ASPIRATE Performed at [facility], [address], [place], [place] [address]    Special Requests   Final    NONE Performed at [facility], [address] [place], [place] [address]    Gram Stain   Final    RARE WBC PRESENT,BOTH PMN AND MONONUCLEAR MODERATE GRAM POSITIVE COCCI IN PAIRS Performed at [facility], [place] [address] [place], [place] [address]    Culture MODERATE STAPHYLOCOCCUS AUREUS  Final   Report Status 09/18/2020  FINAL  Final   Organism ID, Bacteria STAPHYLOCOCCUS AUREUS  Final      Susceptibility   Staphylococcus aureus - MIC*    CIPROFLOXACIN <=0.5 SENSITIVE Sensitive     ERYTHROMYCIN >=8 RESISTANT Resistant     GENTAMICIN <=0.5 SENSITIVE Sensitive     OXACILLIN 0.5 SENSITIVE Sensitive     TETRACYCLINE <=1 SENSITIVE Sensitive     VANCOMYCIN <=0.5 SENSITIVE Sensitive     TRIMETH/SULFA <=10 SENSITIVE Sensitive     CLINDAMYCIN RESISTANT Resistant     RIFAMPIN <=0.5 SENSITIVE Sensitive     Inducible Clindamycin POSITIVE Resistant     * MODERATE STAPHYLOCOCCUS AUREUS  Aerobic Culture (superficial  specimen)     Status: None   Collection Time: 09/16/20  2:23 PM   Specimen: Skin, Cyst/Tag/Debridement; Wound  Result Value Ref Range Status   Specimen Description   Final    SKIN Performed at [facility], [address] [place], [place] [address]    Special Requests   Final    NONE Performed at [facility], [address] [place], [place] [address]    Gram Stain   Final    NO WBC SEEN MODERATE GRAM POSITIVE COCCI IN PAIRS FEW GRAM NEGATIVE RODS    Culture   Final    MODERATE STAPHYLOCOCCUS AUREUS WITHIN MIXED CULTURE Performed at [facility], [place] [address], [place], [place] [address]    Report Status 09/20/2020 FINAL  Final   Organism ID, Bacteria STAPHYLOCOCCUS AUREUS  Final      Susceptibility   Staphylococcus aureus - MIC*    CIPROFLOXACIN <=0.5 SENSITIVE Sensitive     ERYTHROMYCIN RESISTANT Resistant     GENTAMICIN <=0.5 SENSITIVE Sensitive     OXACILLIN 0.5 SENSITIVE Sensitive     TETRACYCLINE <=1 SENSITIVE Sensitive     VANCOMYCIN <=0.5 SENSITIVE Sensitive     TRIMETH/SULFA <=10 SENSITIVE Sensitive     CLINDAMYCIN RESISTANT Resistant     RIFAMPIN <=0.5 SENSITIVE Sensitive     Inducible Clindamycin POSITIVE Resistant     * MODERATE STAPHYLOCOCCUS AUREUS  MRSA PCR Screening     Status: None   Collection Time: 09/18/20  4:50 AM   Specimen: Nasopharyngeal  Result Value Ref Range Status   MRSA by PCR NEGATIVE NEGATIVE Final    Comment:        The GeneXpert MRSA Assay (FDA approved for NASAL specimens only), is one component of a comprehensive MRSA colonization surveillance program. It is not intended to diagnose MRSA infection nor to guide or monitor treatment for MRSA infections. Performed at [facility], [place], [place] [address]           IMAGING    US Abdomen Limited RUQ (LIVER/GB)  Result Date: 09/20/2020 CLINICAL DATA:  Fever EXAM: ULTRASOUND ABDOMEN LIMITED RIGHT UPPER QUADRANT  COMPARISON:  CT 09/13/2020 FINDINGS: Gallbladder: Normal gallbladder distension. Normal anechoic bile. No visible calcified gallstones. No gallbladder wall thickening. There is trace fluid in the right upper quadrant near the gallbladder tip but without pericholecystic edematous changes. Could reflect small volume ascites. Common bile duct: Diameter: 3.6 mm, nondilated Liver: There is mild nodularity of the hepatic surface contour, particularly along the margins of the left lobe liver (18/42 for example). No focal liver lesion is seen. Hepatic echogenicity remains within normal limits. No intrahepatic biliary dilatation. Portal vein is patent on color Doppler imaging with normal direction of blood flow towards the liver. Other: Technically challenging exam due to ventilated  status and patient agitation. Small volume of anechoic free fluid is seen in the right upper quadrant near the gallbladder though may reflect a trace volume of ascites. IMPRESSION: 1. Technically challenging exam due to patient agitation and ventilated status. 2. Mild nodularity of the hepatic surface contour, can be seen in the setting of cirrhosis. Correlate with LFTs. 3. Trace fluid in the right upper quadrant near the gallbladder tip. May reflect a trace volume ascites given the absence of other sonographic features of acute cholecystitis. Electronically Signed   By: Lovena Le M.D.   On: 09/20/2020 19:49     Nutrition Status: Nutrition Problem: Increased nutrient needs Etiology: catabolic illness ([id number]) Signs/Symptoms: estimated needs Interventions: Refer to RD note for recommendations        Indwelling Urinary Catheter continued, requirement due to   Reason to continue Indwelling Urinary Catheter strict Intake/Output monitoring for hemodynamic instability   Central Line/ continued, requirement due to  Reason to continue [place] of central venous pressure or other hemodynamic parameters and poor IV  access   Ventilator continued, requirement due to severe respiratory failure   Ventilator Sedation RASS 0 to -2      ASSESSMENT AND PLAN SYNOPSIS   30 yo male recent hospitalization for perforated diverticulitis s/p colostomy with reversal and leakage of anastomosis with s/p revision now acutely hypoxemic with COVID19 induced severe ARDS.failure to wean from VENT-self extubation and trial of extubation failed due to Severe encephalopathy leading to inability to protect airway. Tracheostomy performed 1/25. PEG placed 1/26 Plan for transfer to [facility] when bed is available.  PATIENT IS STABLE TO TRANSFER TO [facility]   Severe ACUTE Hypoxic and Hypercapnic Respiratory Failure -continue Mechanical Ventilator support -continue Bronchodilator Therapy -Wean Fio2 and PEEP as tolerated -VAP/VENT bundle implementation -will perform SAT/SBT when respiratory parameters are met WEAN TO PS as tolerated  ACUTE DIASTOLIC CARDIAC FAILURE-  -oxygen as needed -Lasix as tolerated   Morbid obesity, possible OSA.   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(cardiac arrest Jan 8th)  INFECTIOUS DISEASE -continue antibiotics as prescribed -follow up cultures -follow up ID consultation [place] NEG Plan for pan CT scans-will discuss with ID  GI GI PROPHYLAXIS as indicated  NUTRITIONAL STATUS DIET-->TF's as tolerated Constipation protocol as indicated  ENDO - ICU hypoglycemic\Hyperglycemia protocol -check FSBS per protocol   DVT/GI PRX ordered and assessed TRANSFUSIONS AS NEEDED MONITOR FSBS I Assessed the need for Labs I Assessed the need for Foley I Assessed the need for Central Venous Line Family Discussion when available I Assessed the need for Mobilization I made an Assessment of medications to be adjusted accordingly Safety Risk assessment  completed  CASE DISCUSSED IN MULTIDISCIPLINARY ROUNDS WITH ICU TEAM   Critical Care Time devoted to patient care services described in this note is 45 minutes.   Overall, patient is critically ill, prognosis is guarded.     Corrin Parker, M.D.  [facility]  Medical Director [place] Director [facility] Cardio-Pulmonary Department

## 2020-09-21 NOTE — Progress Notes (Signed)
[place] for Electrolyte Monitoring and Replacement   Recent Labs: Potassium (mmol/L)  Date Value  09/21/2020 4.1   Magnesium (mg/dL)  Date Value  09/21/2020 1.6 (L)   Calcium (mg/dL)  Date Value  09/21/2020 8.2 (L)   Albumin (g/dL)  Date Value  09/21/2020 1.8 (L)   Phosphorus (mg/dL)  Date Value  09/21/2020 4.7 (H)   Sodium (mmol/L)  Date Value  09/21/2020 138    Assessment: 30 yo male admitted with perforated diverticulitis and severe acute hypoxemic respiratory failure secondary to COVID-19 infection. Patient has severe ARDs and was reintubated 1/21. Pharmacy has been consulted for electrolyte monitoring and replacement.   Patient is status post tracheostomy 1/25 and PEG 1/26. Nutrition provided via tube feeds. Patient is receiving intermittent diuresis.   Goal of Therapy:  Electrolytes WNL   Plan:  Magnesium 4 g IV given.  Pharmacy will continue to follow and replace electrolytes as needed.   Tawnya Crook, PharmD 09/21/2020 9:35 AM

## 2020-09-22 ENCOUNTER — Inpatient Hospital Stay: Payer: No Typology Code available for payment source

## 2020-09-22 DIAGNOSIS — J15211 Pneumonia due to Methicillin susceptible Staphylococcus aureus: Secondary | ICD-10-CM | POA: Diagnosis not present

## 2020-09-22 DIAGNOSIS — A419 Sepsis, unspecified organism: Secondary | ICD-10-CM | POA: Diagnosis not present

## 2020-09-22 DIAGNOSIS — J9601 Acute respiratory failure with hypoxia: Secondary | ICD-10-CM | POA: Diagnosis not present

## 2020-09-22 DIAGNOSIS — R509 Fever, unspecified: Secondary | ICD-10-CM | POA: Diagnosis not present

## 2020-09-22 DIAGNOSIS — R7881 Bacteremia: Secondary | ICD-10-CM | POA: Diagnosis not present

## 2020-09-22 DIAGNOSIS — G9341 Metabolic encephalopathy: Secondary | ICD-10-CM | POA: Diagnosis not present

## 2020-09-22 DIAGNOSIS — U071 COVID-19: Secondary | ICD-10-CM | POA: Diagnosis not present

## 2020-09-22 LAB — PROCALCITONIN: Procalcitonin: 0.37 ng/mL

## 2020-09-22 LAB — CBC WITH DIFFERENTIAL/PLATELET
Abs Immature Granulocytes: 0.62 10*3/uL — ABNORMAL HIGH (ref 0.00–0.07)
Basophils Absolute: 0 10*3/uL (ref 0.0–0.1)
Basophils Relative: 1 %
Eosinophils Absolute: 0.1 10*3/uL (ref 0.0–0.5)
Eosinophils Relative: 1 %
HCT: 22.6 % — ABNORMAL LOW (ref 39.0–52.0)
Hemoglobin: 6.6 g/dL — ABNORMAL LOW (ref 13.0–17.0)
Immature Granulocytes: 8 %
Lymphocytes Relative: 20 %
Lymphs Abs: 1.6 10*3/uL (ref 0.7–4.0)
MCH: 27.2 pg (ref 26.0–34.0)
MCHC: 29.2 g/dL — ABNORMAL LOW (ref 30.0–36.0)
MCV: 93 fL (ref 80.0–100.0)
Monocytes Absolute: 1 10*3/uL (ref 0.1–1.0)
Monocytes Relative: 13 %
Neutro Abs: 4.6 10*3/uL (ref 1.7–7.7)
Neutrophils Relative %: 57 %
Platelets: 302 10*3/uL (ref 150–400)
RBC: 2.43 MIL/uL — ABNORMAL LOW (ref 4.22–5.81)
RDW: 18 % — ABNORMAL HIGH (ref 11.5–15.5)
WBC: 8 10*3/uL (ref 4.0–10.5)
nRBC: 0.6 % — ABNORMAL HIGH (ref 0.0–0.2)

## 2020-09-22 LAB — RENAL FUNCTION PANEL
Albumin: 1.8 g/dL — ABNORMAL LOW (ref 3.5–5.0)
Anion gap: 9 (ref 5–15)
BUN: 5 mg/dL — ABNORMAL LOW (ref 6–20)
CO2: 33 mmol/L — ABNORMAL HIGH (ref 22–32)
Calcium: 8 mg/dL — ABNORMAL LOW (ref 8.9–10.3)
Chloride: 97 mmol/L — ABNORMAL LOW (ref 98–111)
Creatinine, Ser: <0.3 mg/dL — ABNORMAL LOW (ref 0.61–1.24)
Glucose, Bld: 111 mg/dL — ABNORMAL HIGH (ref 70–99)
Phosphorus: 4.7 mg/dL — ABNORMAL HIGH (ref 2.5–4.6)
Potassium: 4 mmol/L (ref 3.5–5.1)
Sodium: 139 mmol/L (ref 135–145)

## 2020-09-22 LAB — HEMOGLOBIN AND HEMATOCRIT, BLOOD
HCT: 27 % — ABNORMAL LOW (ref 39.0–52.0)
Hemoglobin: 8.1 g/dL — ABNORMAL LOW (ref 13.0–17.0)

## 2020-09-22 LAB — GLUCOSE, CAPILLARY
Glucose-Capillary: 116 mg/dL — ABNORMAL HIGH (ref 70–99)
Glucose-Capillary: 119 mg/dL — ABNORMAL HIGH (ref 70–99)
Glucose-Capillary: 128 mg/dL — ABNORMAL HIGH (ref 70–99)
Glucose-Capillary: 136 mg/dL — ABNORMAL HIGH (ref 70–99)
Glucose-Capillary: 176 mg/dL — ABNORMAL HIGH (ref 70–99)
Glucose-Capillary: 196 mg/dL — ABNORMAL HIGH (ref 70–99)
Glucose-Capillary: 93 mg/dL (ref 70–99)

## 2020-09-22 LAB — URINALYSIS, MICROSCOPIC (REFLEX)
Bacteria, UA: NONE SEEN
Squamous Epithelial / HPF: NONE SEEN (ref 0–5)

## 2020-09-22 LAB — CORTISOL-AM, BLOOD: Cortisol - AM: 11.7 ug/dL (ref 6.7–22.6)

## 2020-09-22 LAB — URINALYSIS, ROUTINE W REFLEX MICROSCOPIC
Bilirubin Urine: NEGATIVE
Glucose, UA: NEGATIVE mg/dL
Ketones, ur: NEGATIVE mg/dL
Leukocytes,Ua: NEGATIVE
Nitrite: NEGATIVE
Protein, ur: NEGATIVE mg/dL
Specific Gravity, Urine: 1.025 (ref 1.005–1.030)
pH: 7 (ref 5.0–8.0)

## 2020-09-22 LAB — PREPARE RBC (CROSSMATCH)

## 2020-09-22 LAB — TSH: TSH: 2.771 u[IU]/mL (ref 0.350–4.500)

## 2020-09-22 LAB — MAGNESIUM: Magnesium: 1.7 mg/dL (ref 1.7–2.4)

## 2020-09-22 IMAGING — US US EXTREM LOW VENOUS
1 series · 14 of 24 positions shown · non-contrast
Comparison: [DATE]

CLINICAL DATA: Swelling

EXAM:
BILATERAL LOWER EXTREMITY VENOUS DOPPLER ULTRASOUND
TECHNIQUE: Gray-scale sonography with compression, as well as color and duplex
ultrasound, were performed to evaluate the deep venous system(s)
from the level of the common femoral vein through the popliteal and
proximal calf veins.

[Series 1: us venous img lower bilat (dvt) · portal-venous · 14 of 71 slices shown]
[im 1/71]
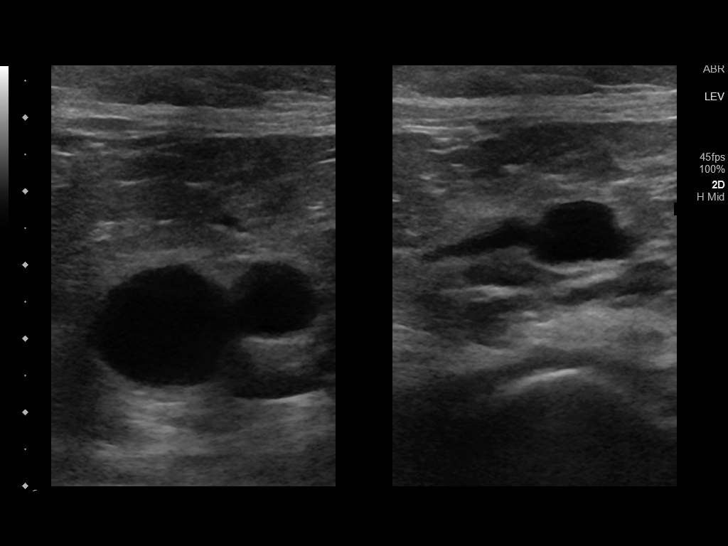
[im 7/71]
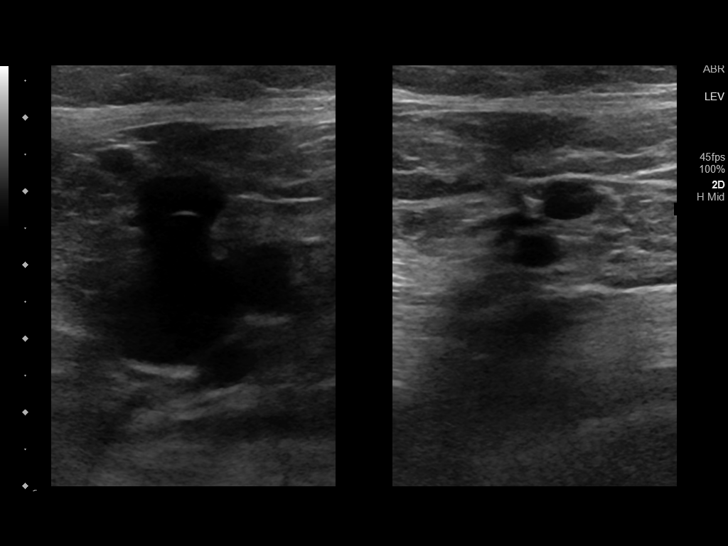
[im 13/71]
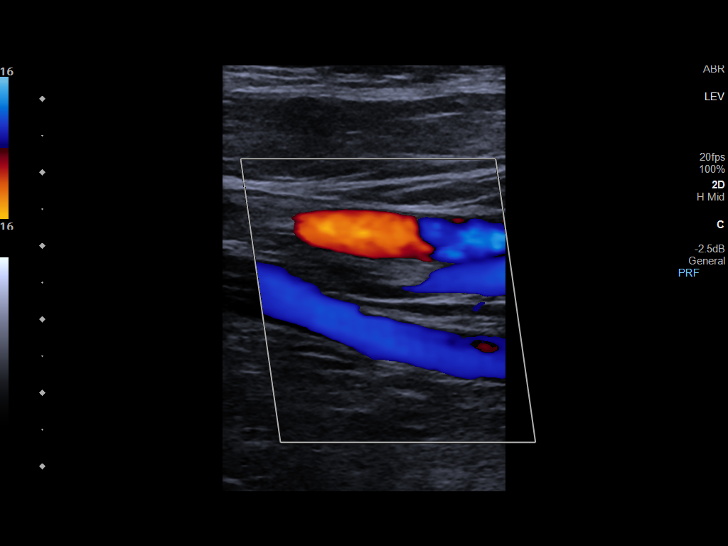
[im 19/71]
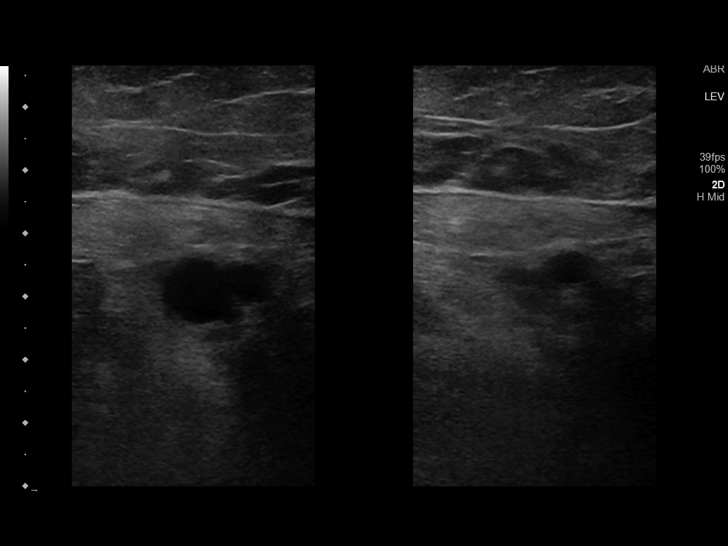
[im 22/71]
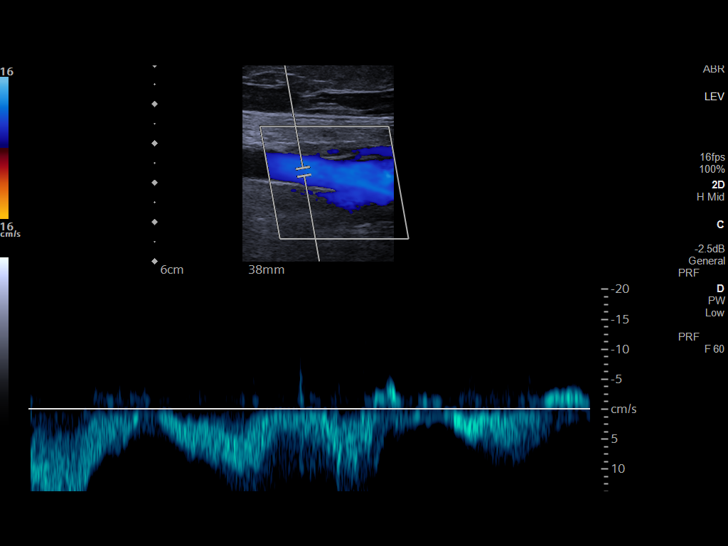
[im 28/71]
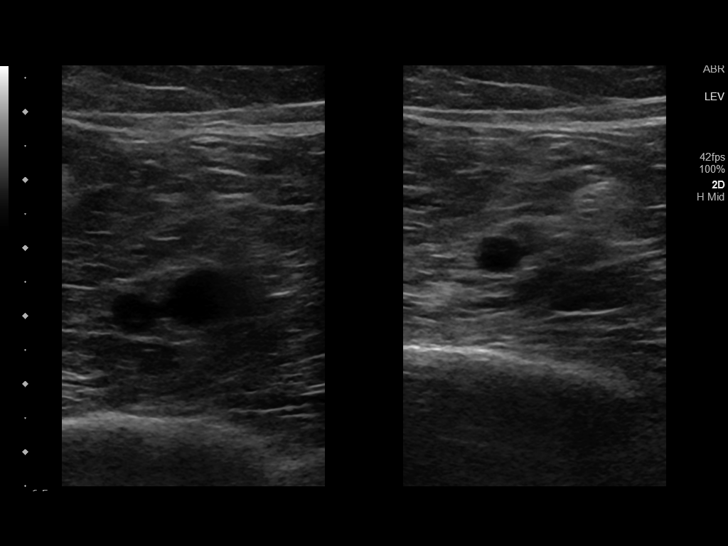
[im 34/71]
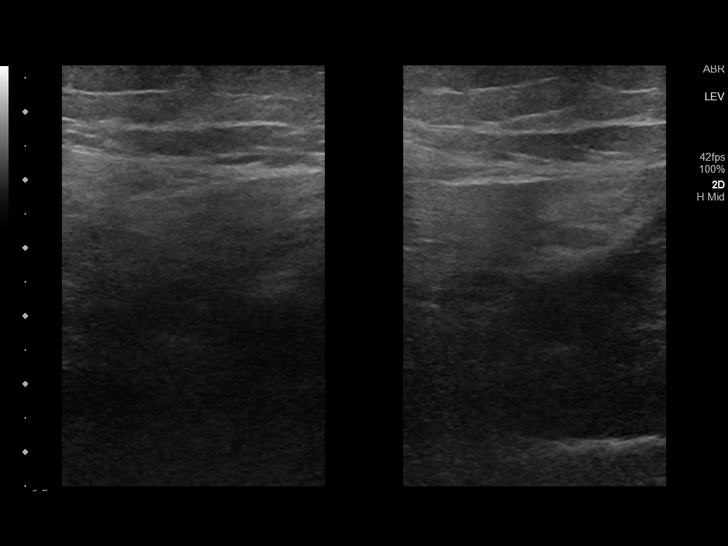
[im 37/71]
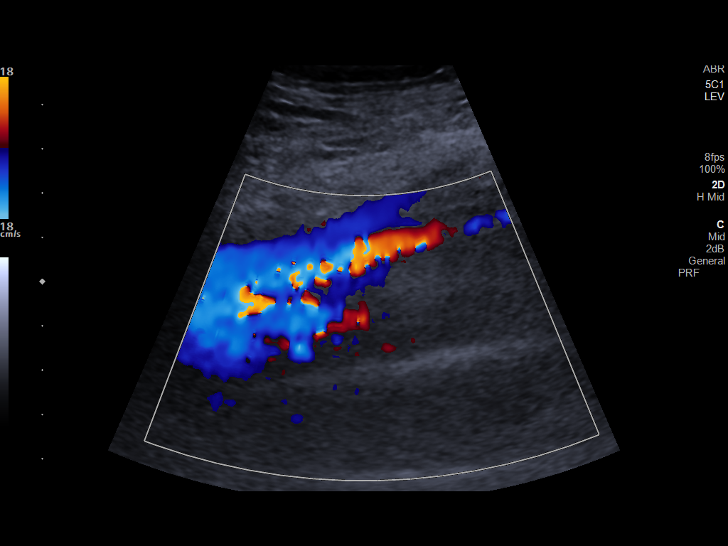
[im 43/71]
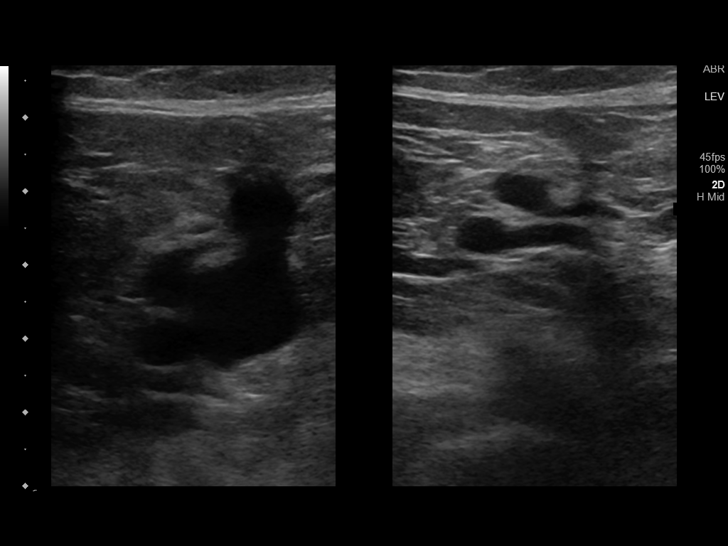
[im 49/71]
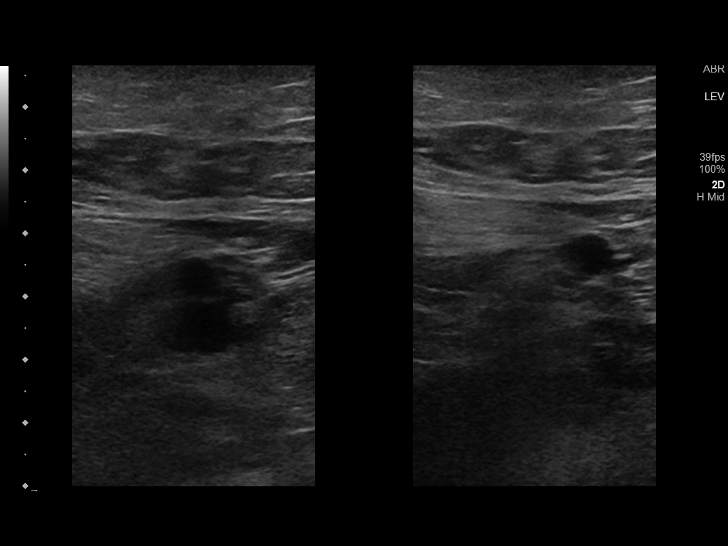
[im 55/71]
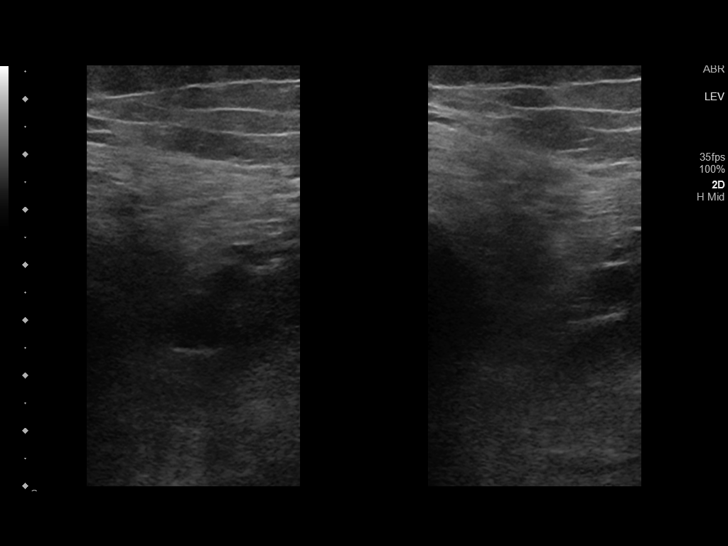
[im 58/71]
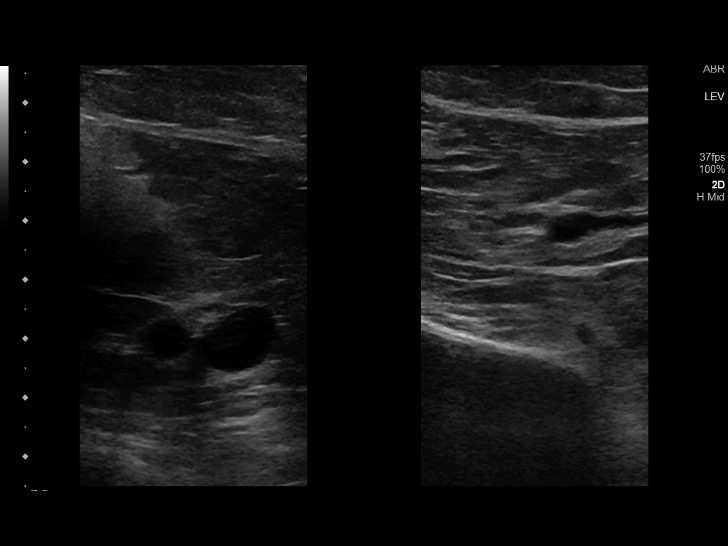
[im 64/71]
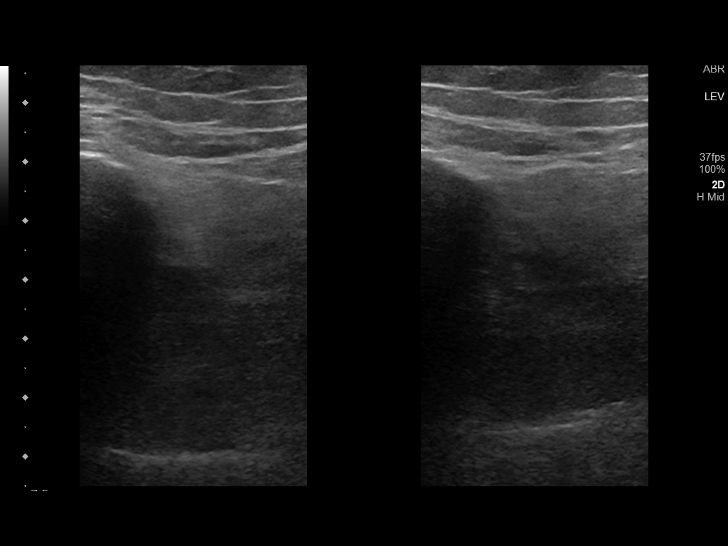
[im 71/71]
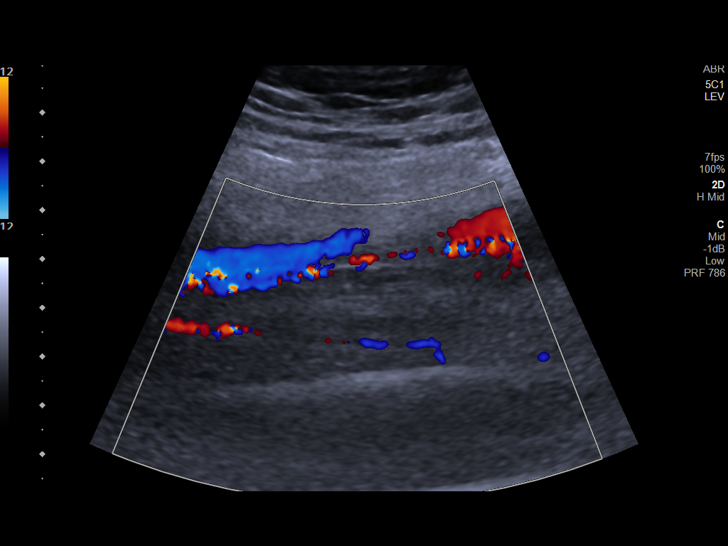

[14 of 24 positions shown; findings below may reference images not displayed]

FINDINGS: VENOUS

Normal compressibility of the common femoral, superficial femoral,
and popliteal veins, as well as the visualized calf veins.
Visualized portions of profunda femoral vein and great saphenous
vein unremarkable. No filling defects to suggest DVT on grayscale or
color Doppler imaging. Doppler waveforms show normal direction of
venous flow, normal respiratory plasticity and response to
augmentation.

OTHER

None.

Limitations: Limited visualization of the right peroneal veins
IMPRESSION: No lower extremity DVT

## 2020-09-22 IMAGING — CT CT L SPINE W/O CM
2 series · 8 of 27 positions shown, 10 images · non-contrast
Comparison: None.

CLINICAL DATA: History of perforated diverticulitis status post
colostomy acute hypoxemia with [13] induced ARDS. Evaluate for
possible epidural abscess.

EXAM:
CT LUMBAR SPINE WITHOUT CONTRAST
TECHNIQUE: Multidetector CT imaging of the lumbar spine was performed without
intravenous contrast administration. Multiplanar CT image
reconstructions were also generated.

[Series 1: cap thins · axial · 0.26mm/px · z∈[-783,-610]mm · 3 of 533 slices shown, 4 images]
[im 123/533  soft-tissue]
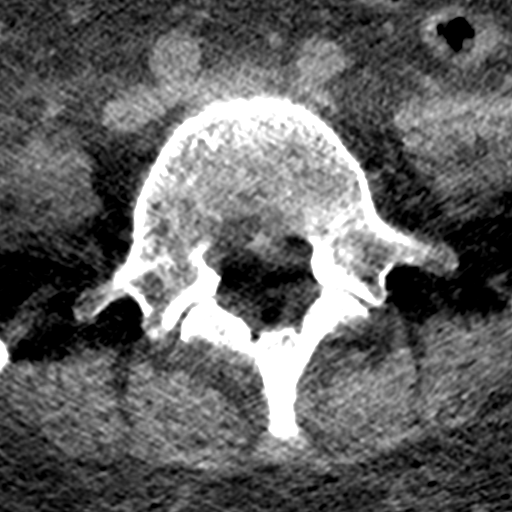
[im 123/533  bone]
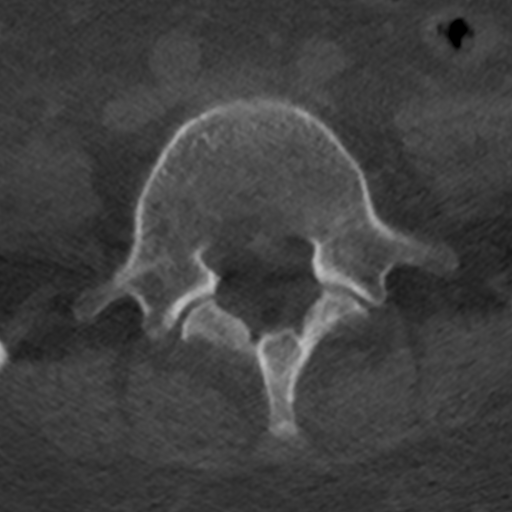
[im 287/533  bone]
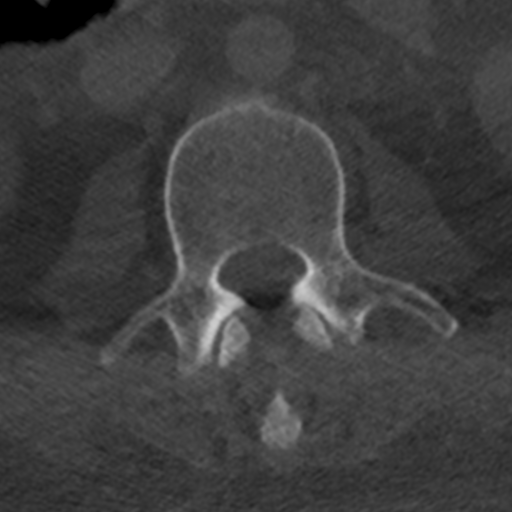
[im 451/533  bone]
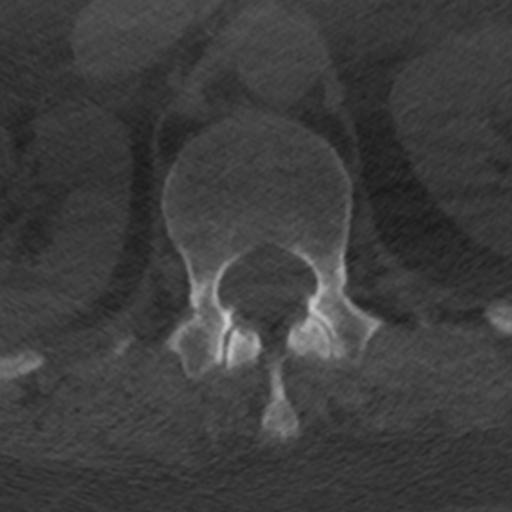

[Series 2: l-spine st bone sag · sagittal · 0.23mm/px · 5 of 90 slices shown, 6 images]
[im 30/90  bone]
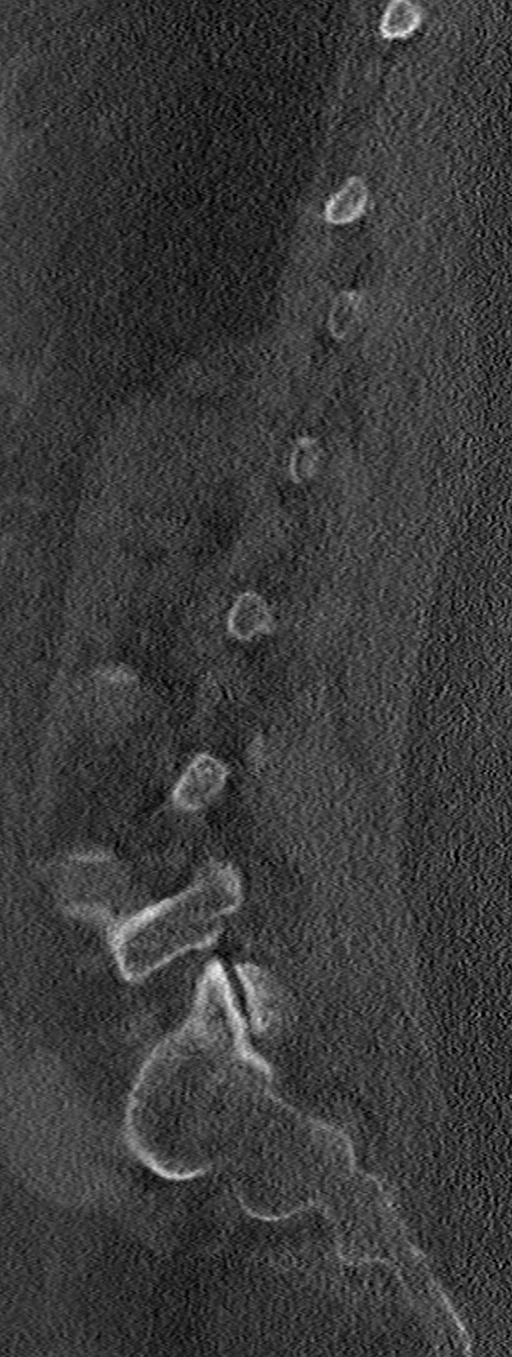
[im 38/90  bone]
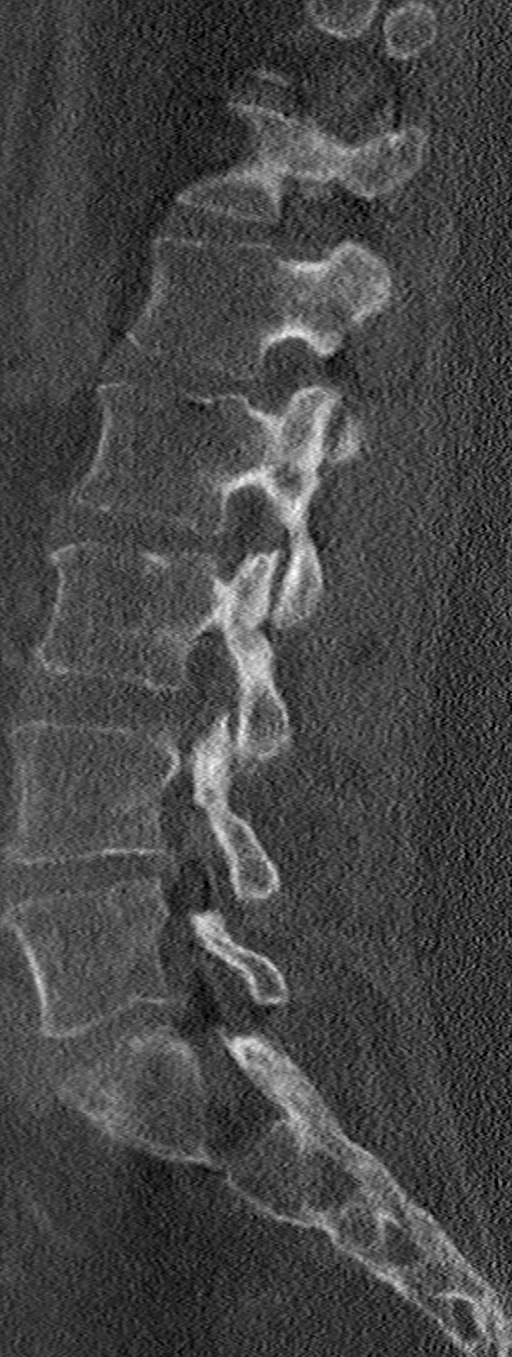
[im 45/90  soft-tissue]
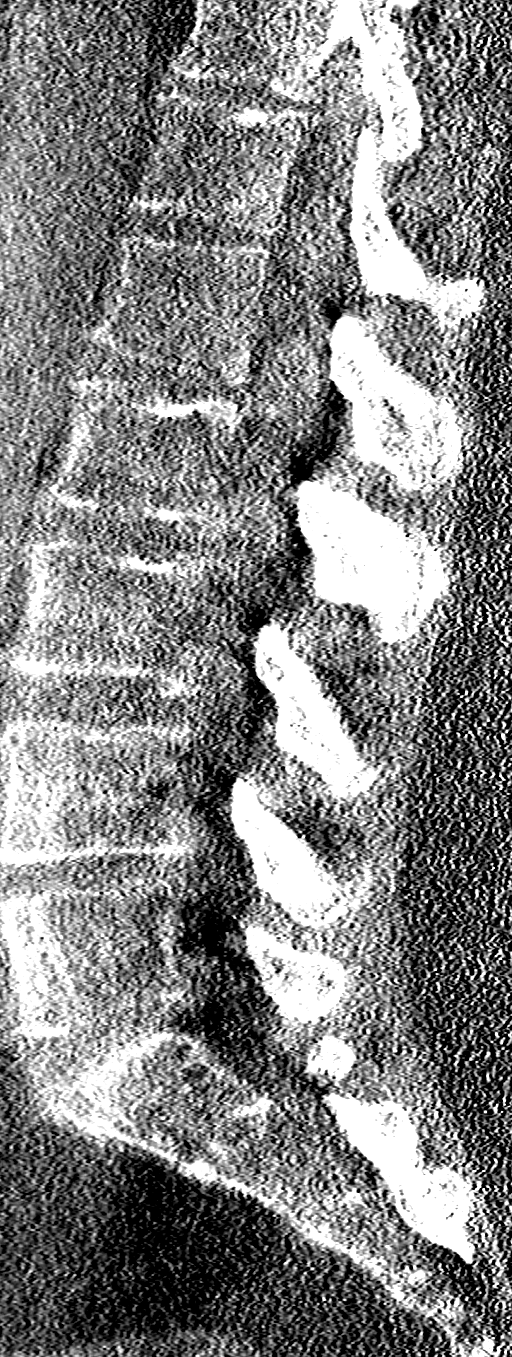
[im 45/90  bone]
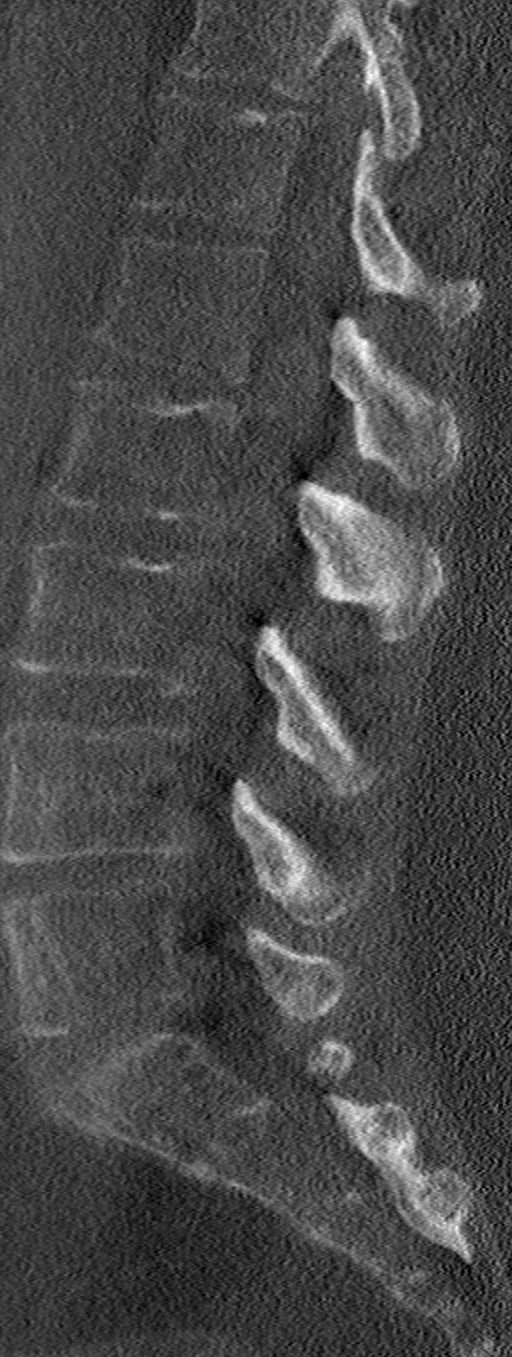
[im 52/90  bone]
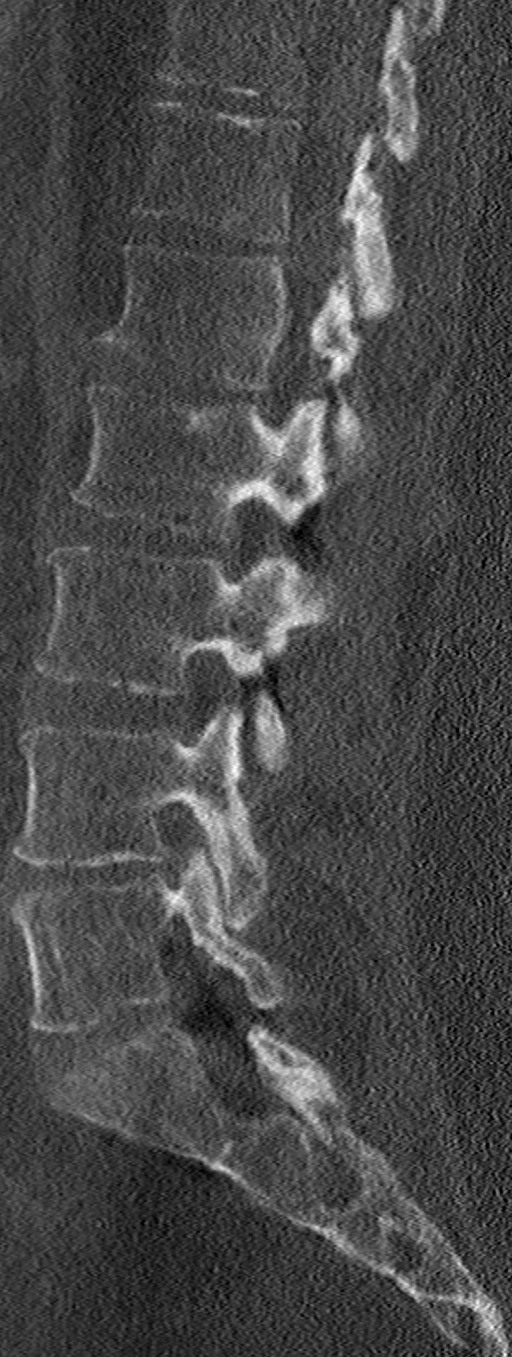
[im 60/90  bone]
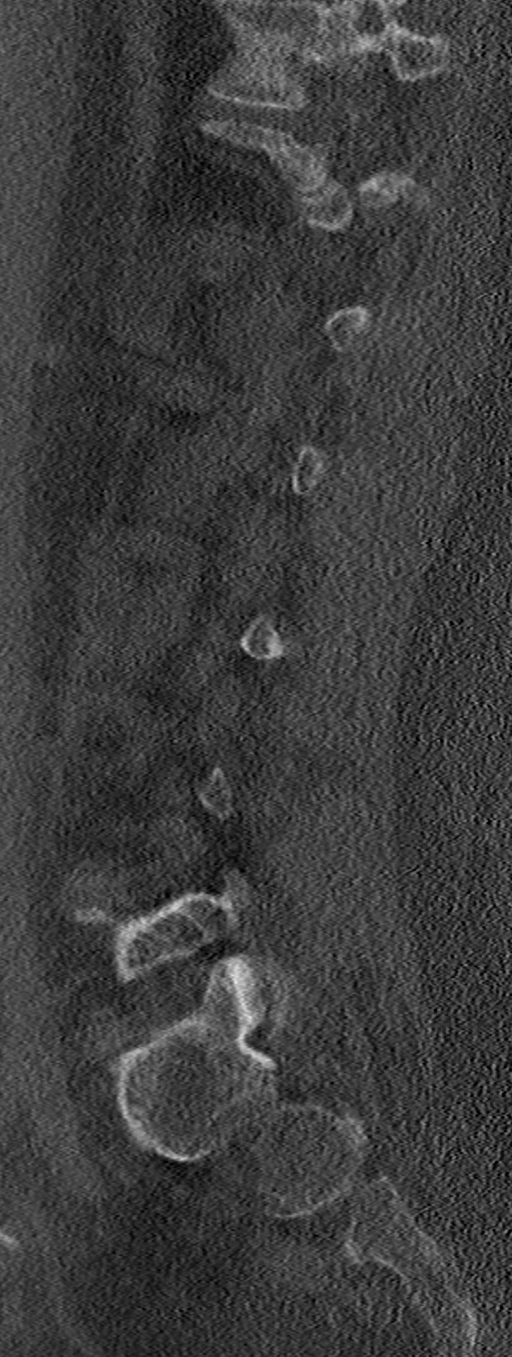

[8 of 27 positions shown; findings below may reference images not displayed]

FINDINGS: Examination is limited by body habitus and some motion artifact.

Segmentation: There are five lumbar type vertebral bodies. The last
full intervertebral disc space is labeled L5-S1.

Alignment: Normal

Vertebrae: Grossly normal. No findings suspicious for discitis or
osteomyelitis.

Paraspinal and other soft tissues: No significant paraspinal or
retroperitoneal findings.

Disc levels: No large disc protrusions, spinal or foraminal
stenosis. No obvious epidural hematoma or abscess.
IMPRESSION: 1. Limited examination due to body habitus and some motion artifact.
2. No findings suspicious for discitis, osteomyelitis or epidural
abscess.
3. No large disc protrusions, spinal or foraminal stenosis.

## 2020-09-22 IMAGING — CT CT T SPINE W/O CM
3 of 4 series · 10 of 33 positions shown, 11 images · non-contrast
Comparison: None.

CLINICAL DATA: Evaluate for epidural abscess.

EXAM:
CT THORACIC SPINE WITHOUT CONTRAST
TECHNIQUE: Multidetector CT images of the thoracic were obtained using the
standard protocol without intravenous contrast.

[Series 1: t-spine st axial · axial · 0.35mm/px · z∈[-544,-422]mm · 2 of 184 slices shown, 3 images]
[im 62/184  soft-tissue]
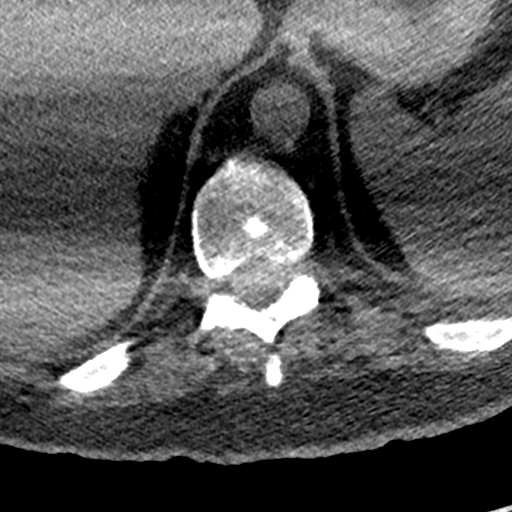
[im 62/184  bone]
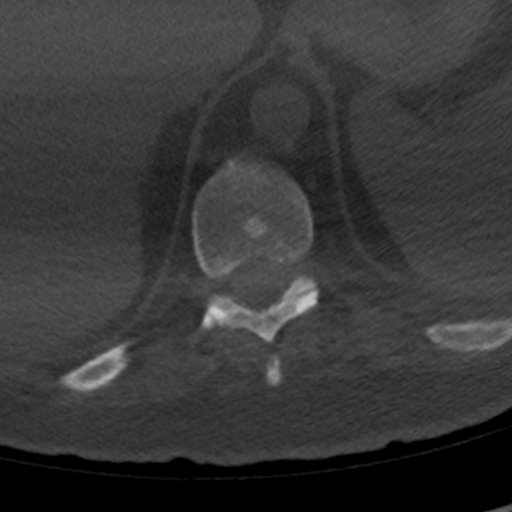
[im 123/184  bone]
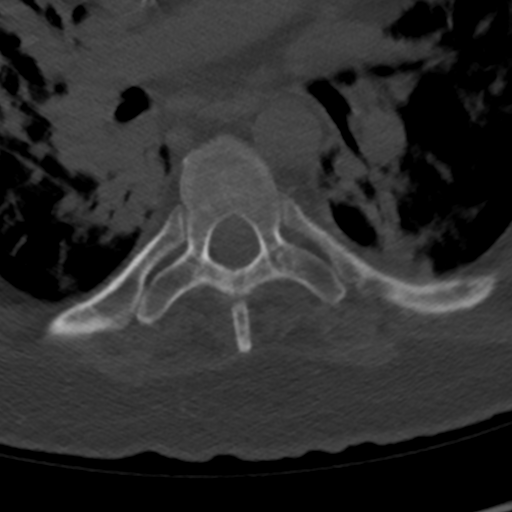

[Series 6: t-spine bone coronal · coronal · 0.36mm/px · 3 of 88 slices shown]
[im 18/88  bone]
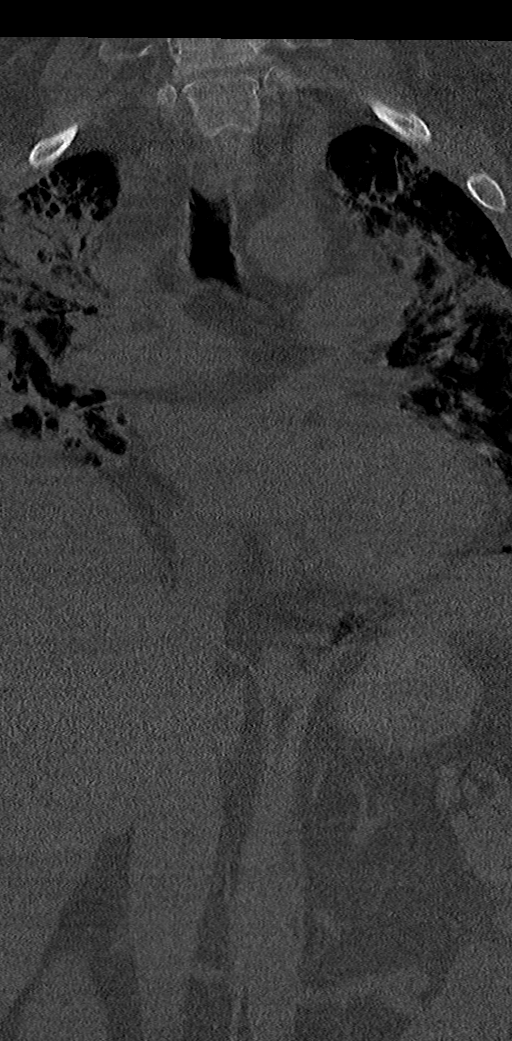
[im 35/88  bone]
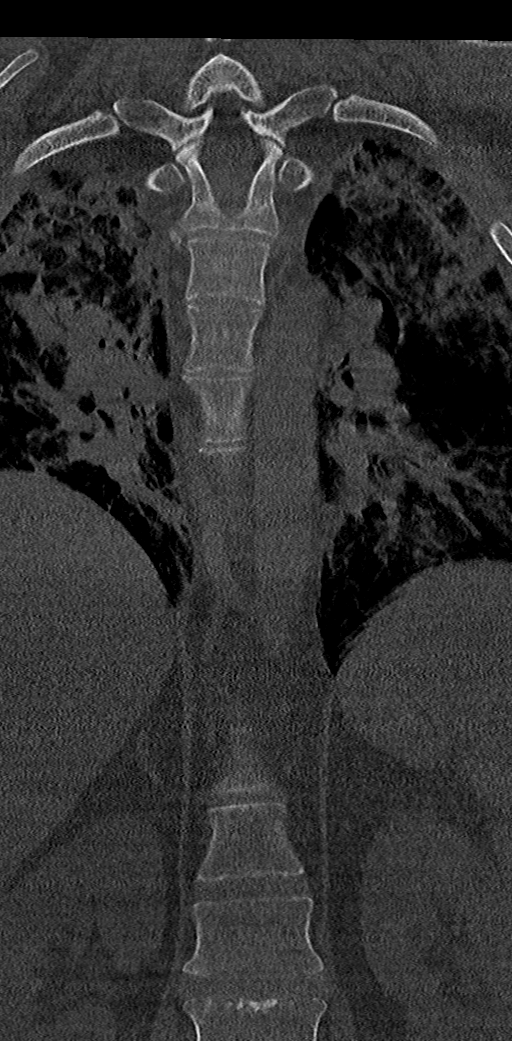
[im 53/88  bone]
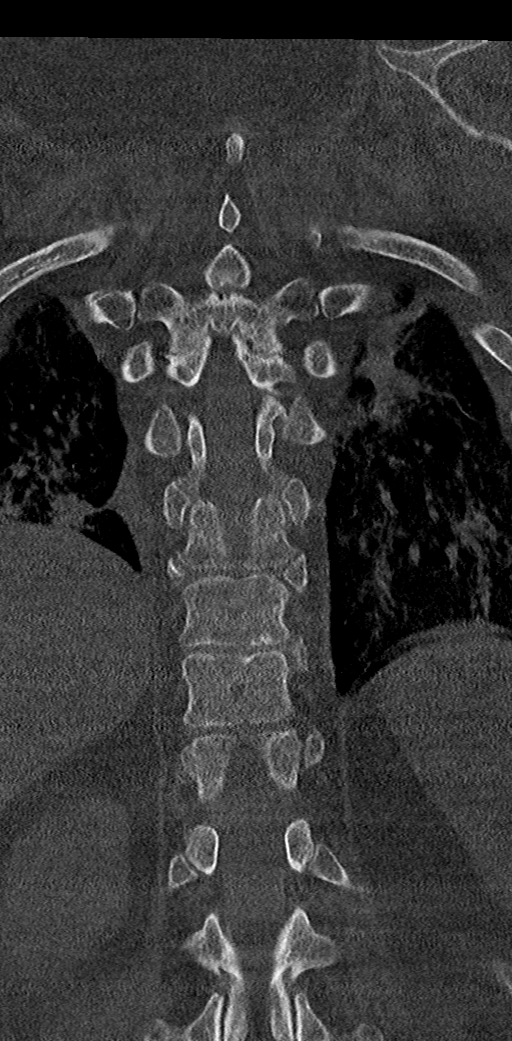

[Series 7: t-spine bone sag · sagittal · 0.34mm/px · 5 of 51 slices shown]
[im 17/51  bone]
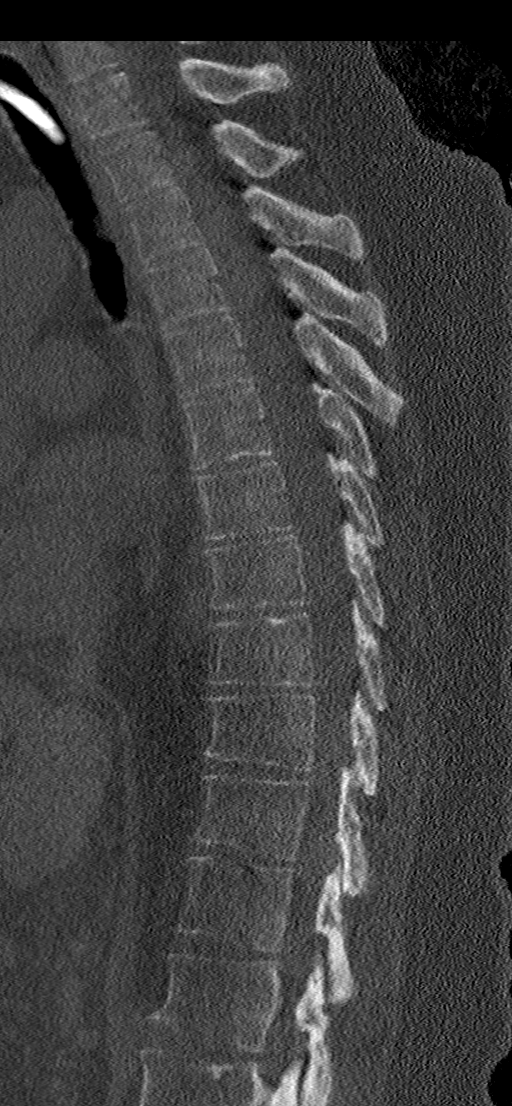
[im 21/51  bone]
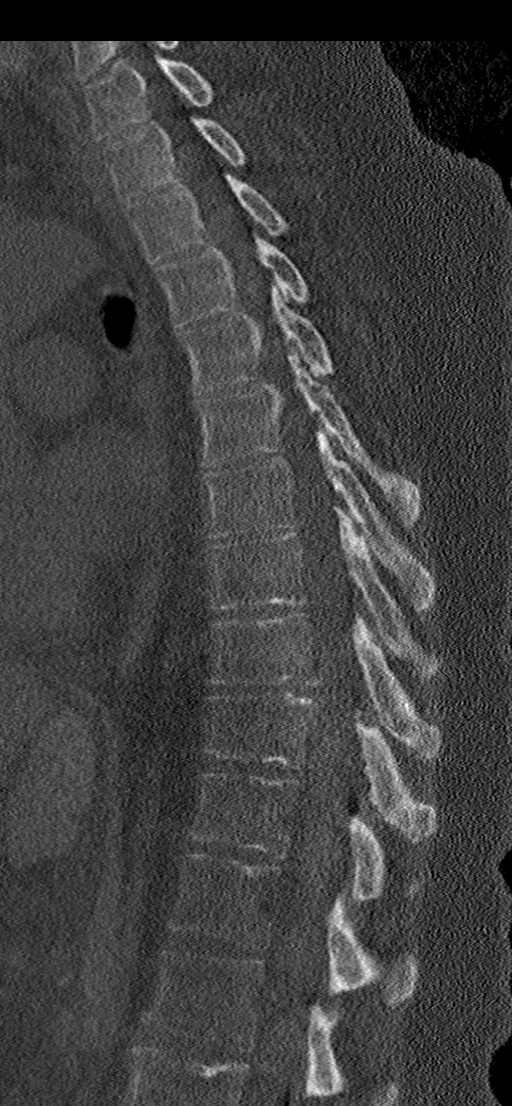
[im 26/51  bone]
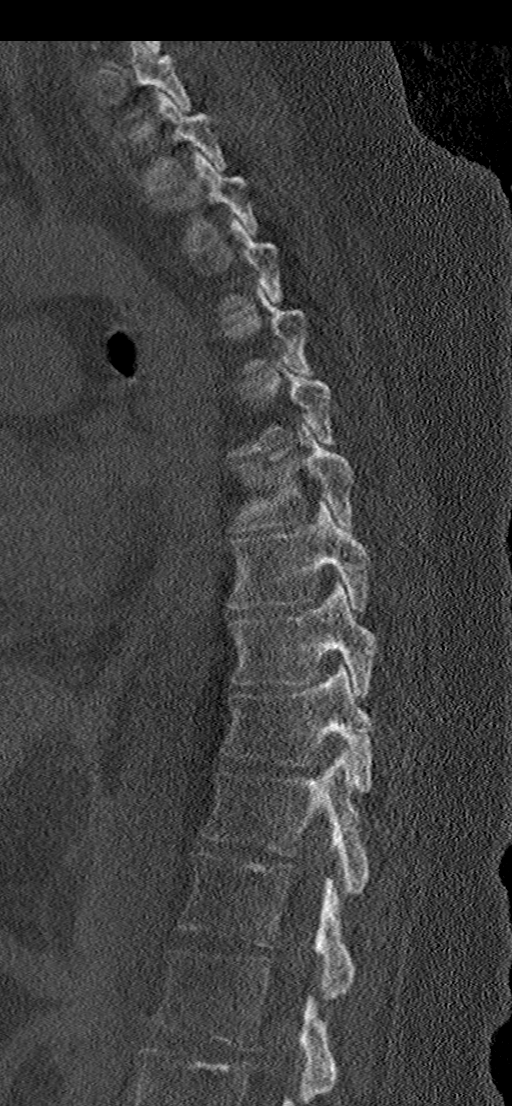
[im 30/51  bone]
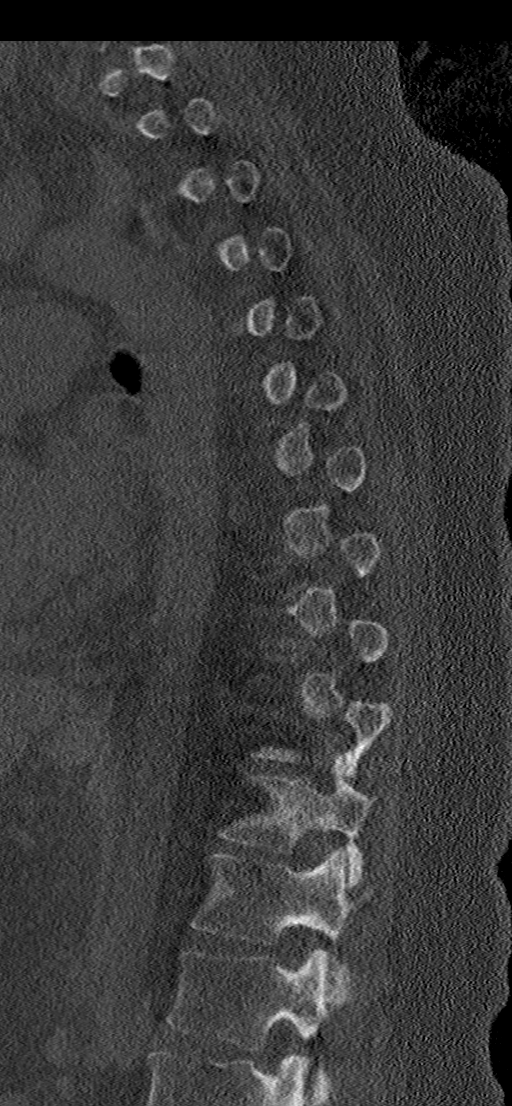
[im 34/51  bone]
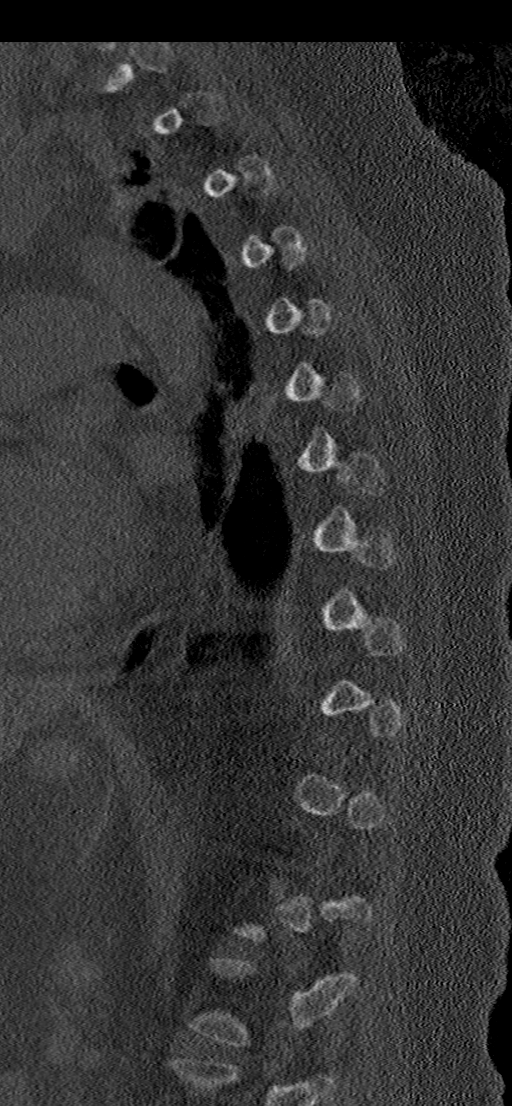

[10 of 33 positions shown; findings below may reference images not displayed]

FINDINGS: Alignment: Normal

Vertebrae: No destructive bony changes to suggest discitis or
osteomyelitis.

Paraspinal and other soft tissues: No significant findings.

Disc levels: No large disc protrusions, spinal or foraminal
stenosis. No findings suspicious for epidural hematoma or abscess.
IMPRESSION: Unremarkable CT examination of the thoracic spine. No findings
suspicious for discitis or osteomyelitis.

## 2020-09-22 IMAGING — CT CT L SPINE W/O CM
2 series · 14 of 20 positions shown, 17 images · non-contrast
Comparison: None.

CLINICAL DATA: History of perforated diverticulitis status post
colostomy acute hypoxemia with [13] induced ARDS. Evaluate for
possible epidural abscess.

EXAM:
CT LUMBAR SPINE WITHOUT CONTRAST
TECHNIQUE: Multidetector CT imaging of the lumbar spine was performed without
intravenous contrast administration. Multiplanar CT image
reconstructions were also generated.

[Series 8: l-spine st axial · axial · 0.35mm/px · z∈[-966,-658]mm · 11 of 184 slices shown, 14 images]
[im 15/184  soft-tissue]
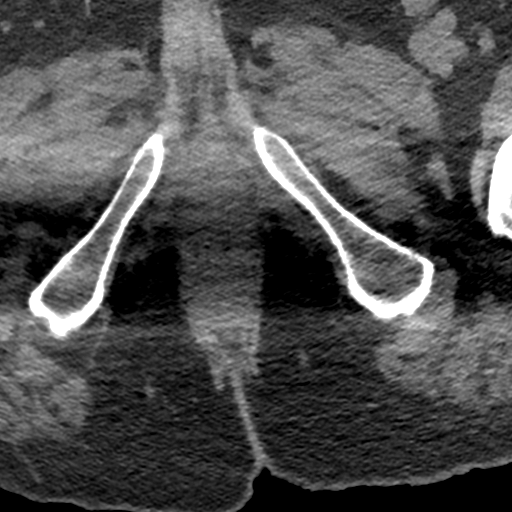
[im 15/184  bone]
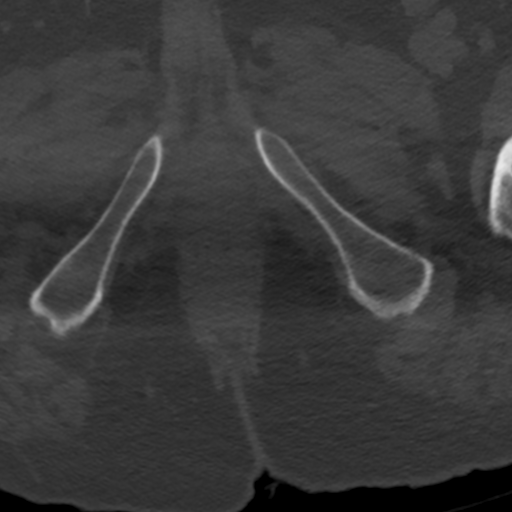
[im 29/184  bone]
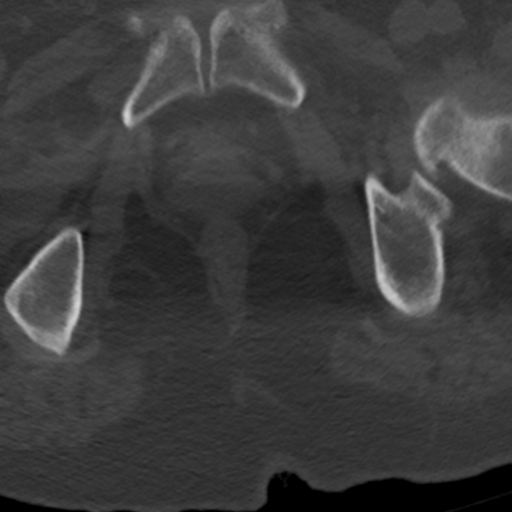
[im 43/184  bone]
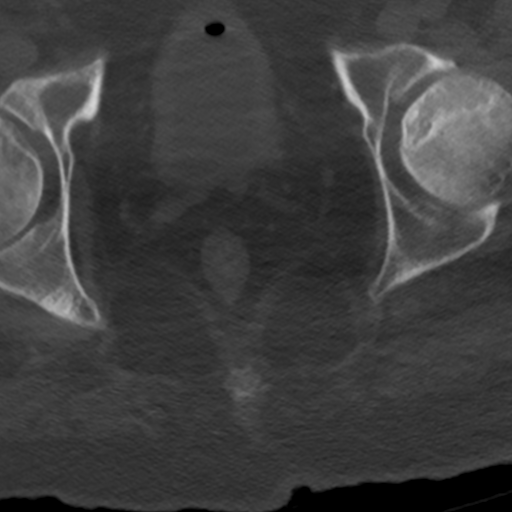
[im 57/184  bone]
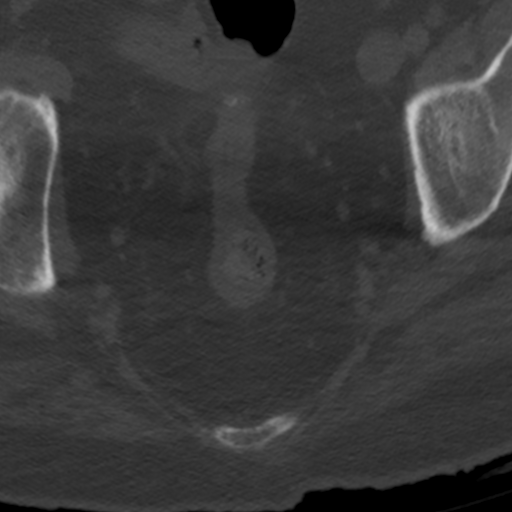
[im 71/184  soft-tissue]
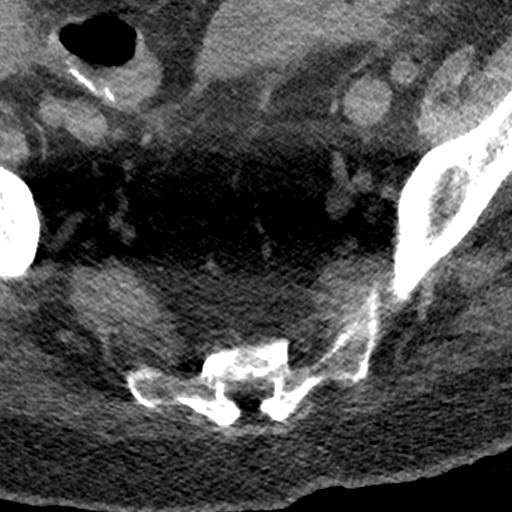
[im 71/184  bone]
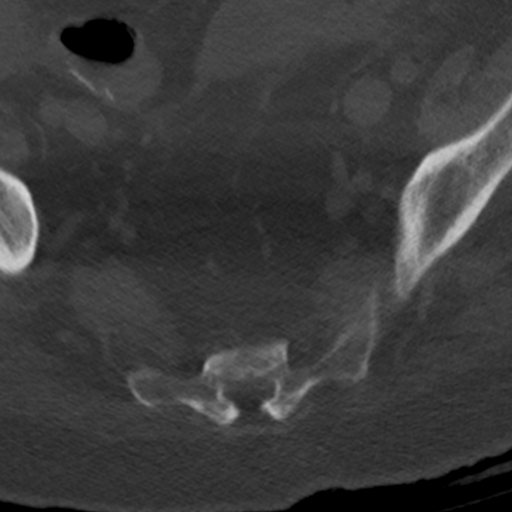
[im 99/184  bone]
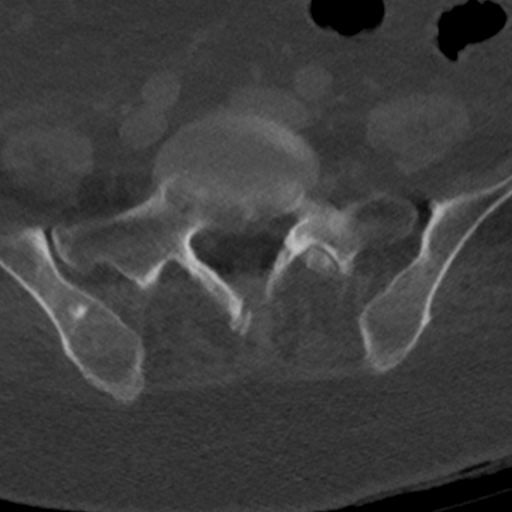
[im 113/184  bone]
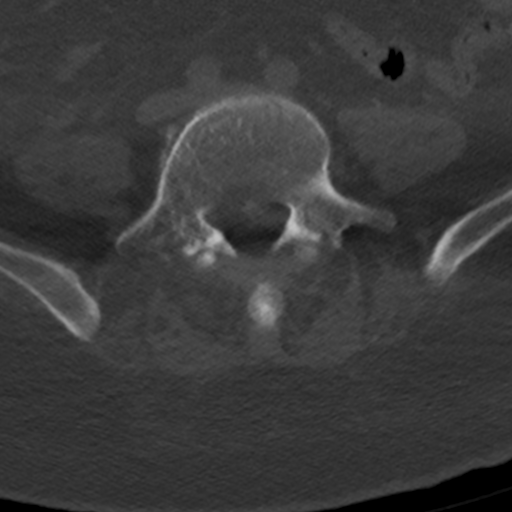
[im 127/184  bone]
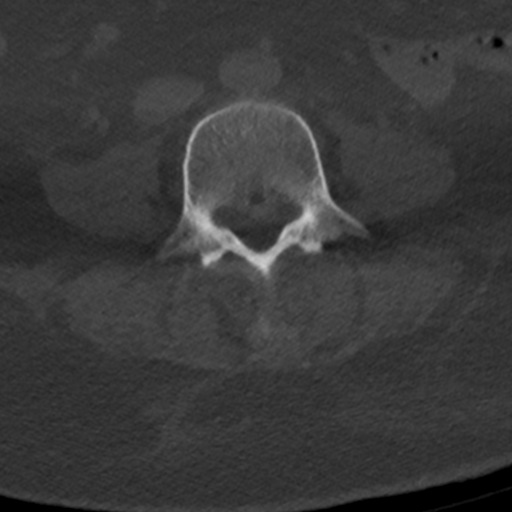
[im 141/184  soft-tissue]
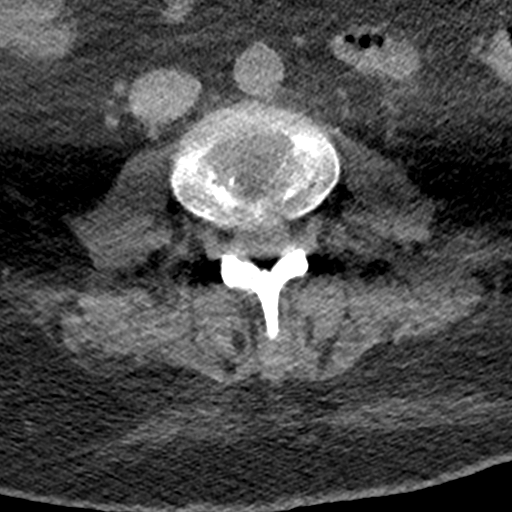
[im 141/184  bone]
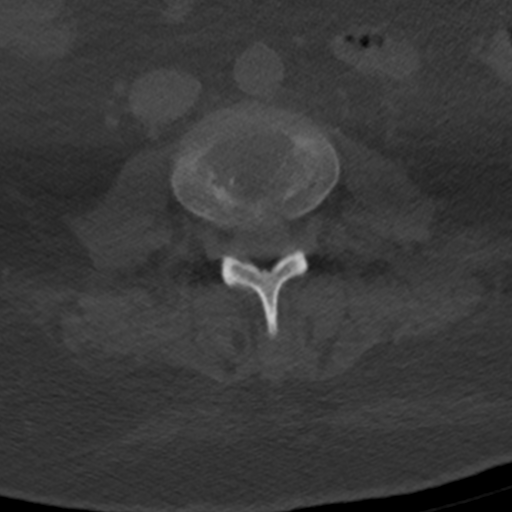
[im 155/184  bone]
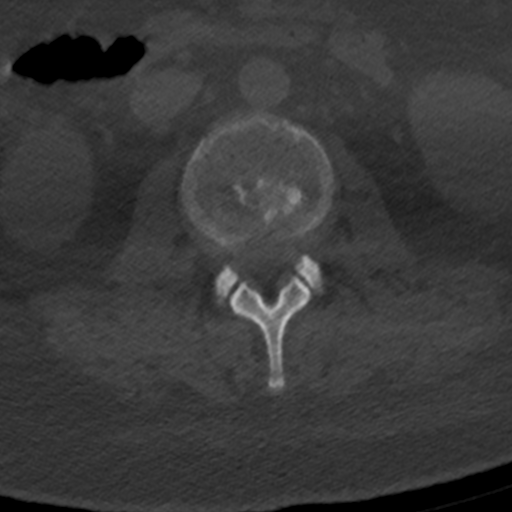
[im 169/184  bone]
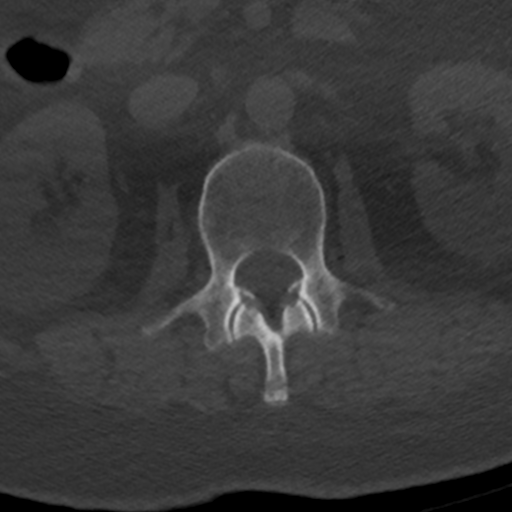

[Series 10: l-spine st bone cor · coronal · 0.35mm/px · 3 of 60 slices shown]
[im 12/60  bone]
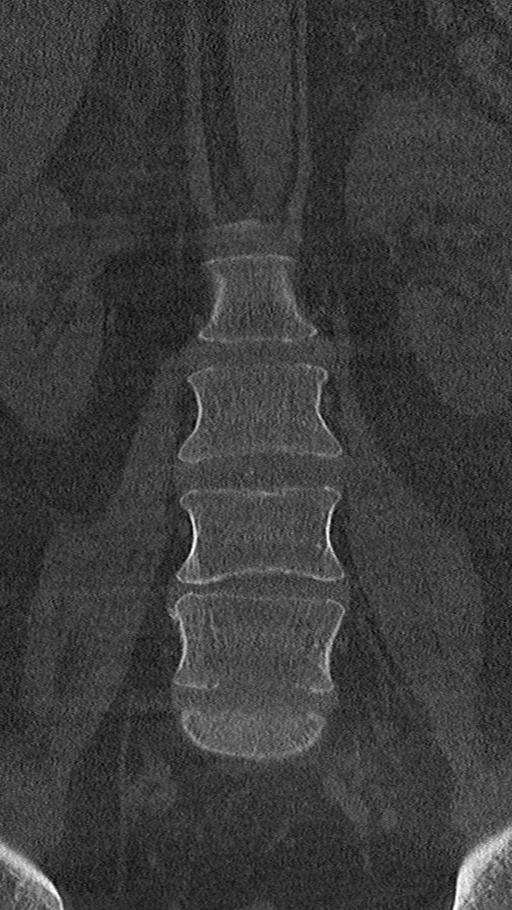
[im 24/60  bone]
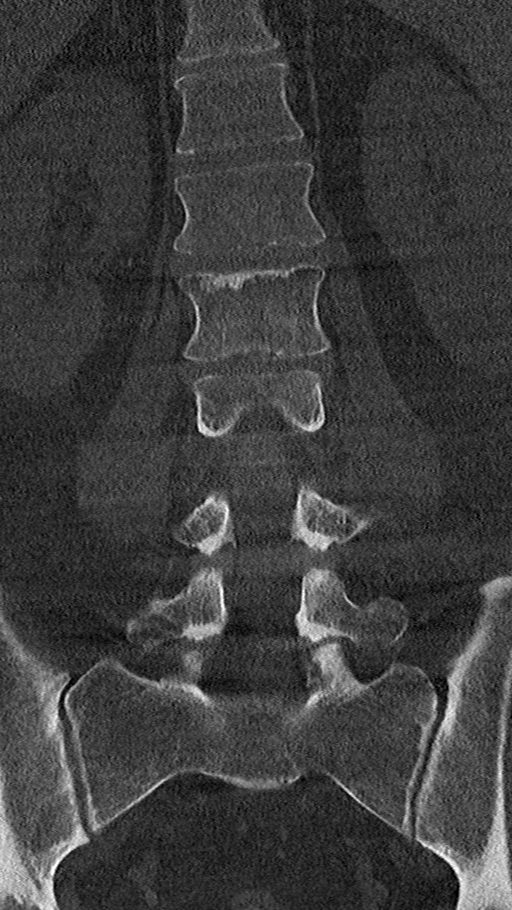
[im 36/60  bone]
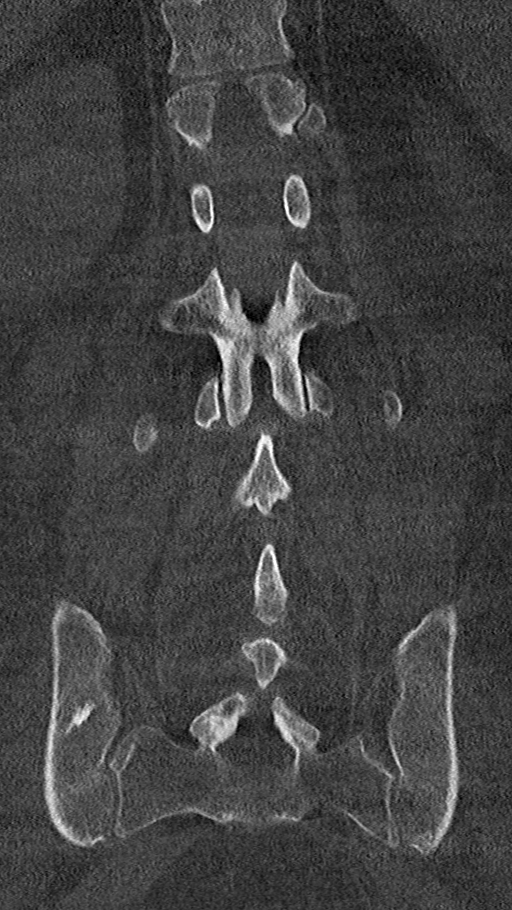

[14 of 20 positions shown; findings below may reference images not displayed]

FINDINGS: Examination is limited by body habitus and some motion artifact.

Segmentation: There are five lumbar type vertebral bodies. The last
full intervertebral disc space is labeled L5-S1.

Alignment: Normal

Vertebrae: Grossly normal. No findings suspicious for discitis or
osteomyelitis.

Paraspinal and other soft tissues: No significant paraspinal or
retroperitoneal findings.

Disc levels: No large disc protrusions, spinal or foraminal
stenosis. No obvious epidural hematoma or abscess.
IMPRESSION: 1. Limited examination due to body habitus and some motion artifact.
2. No findings suspicious for discitis, osteomyelitis or epidural
abscess.
3. No large disc protrusions, spinal or foraminal stenosis.

## 2020-09-22 IMAGING — CT CT ABD-PELV W/O CM
3 of 4 series · 9 of 34 positions shown, 10 images · non-contrast
Comparison: CT scan [DATE]

CLINICAL DATA: History of perforated diverticulitis and COVID
induced ARDS. Evaluate for possible retroperitoneal hematoma.

EXAM:
CT CHEST, ABDOMEN AND PELVIS WITHOUT CONTRAST
TECHNIQUE: Multidetector CT imaging of the chest, abdomen and pelvis was
performed following the standard protocol without IV contrast.

[Series 4: cap wo st · axial · 0.98mm/px · z∈[-645,-645]mm · 1 of 139 slices shown, 2 images]
[im 70/139  soft-tissue]
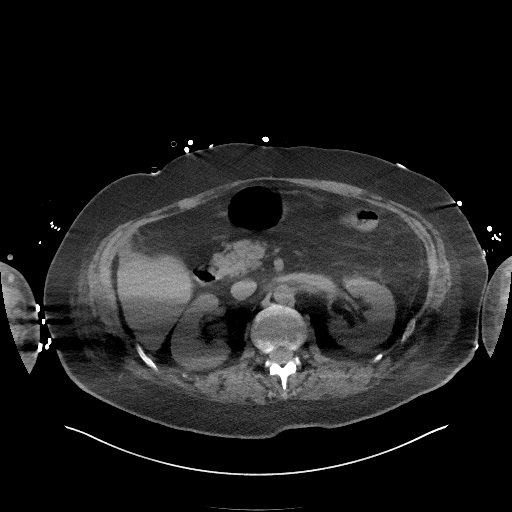
[im 70/139  bone]
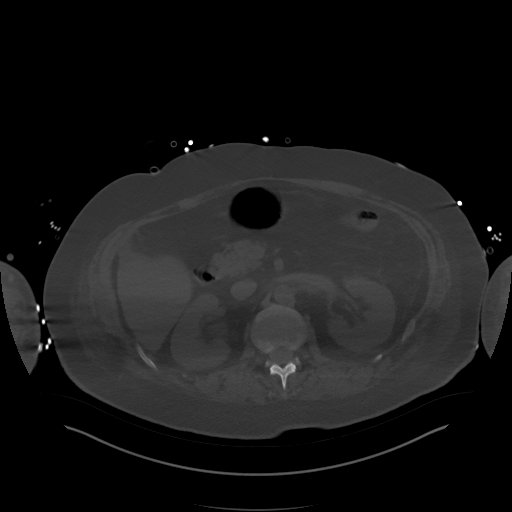

[Series 7: coronal · coronal · 1.03mm/px · 3 of 151 slices shown]
[im 31/151  bone]
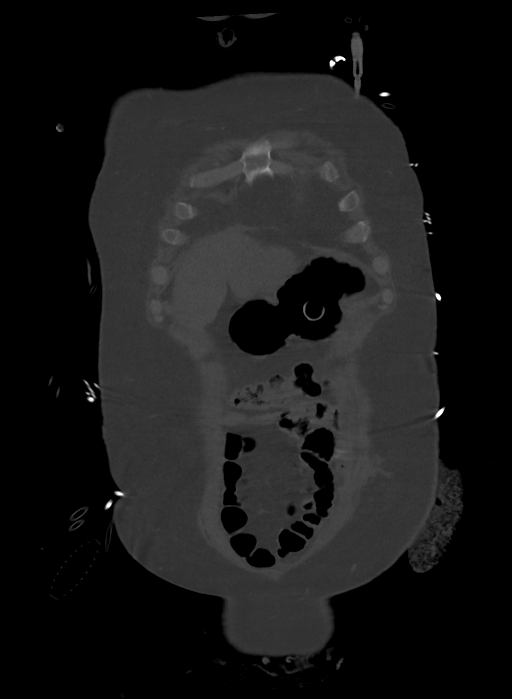
[im 61/151  bone]
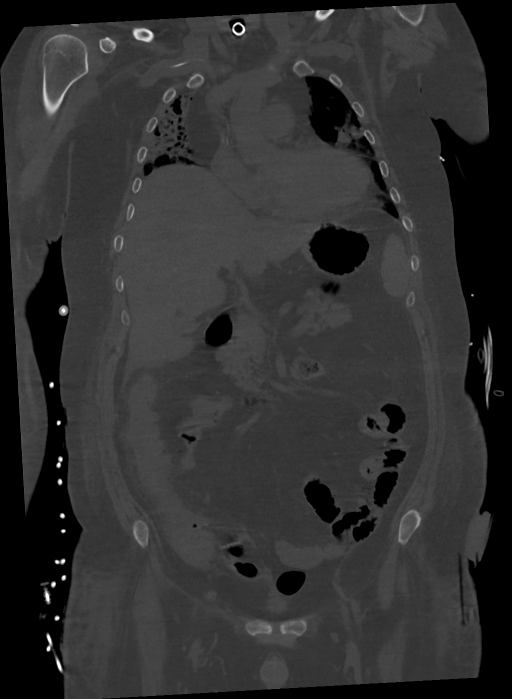
[im 91/151  bone]
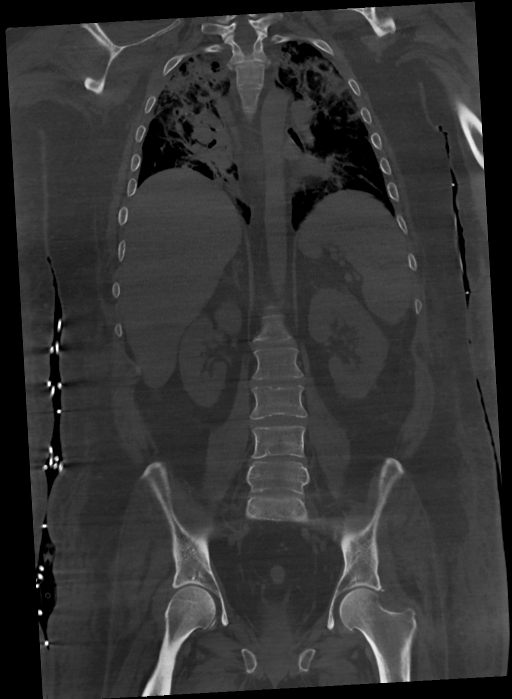

[Series 8: sagittal · sagittal · 0.59mm/px · 5 of 264 slices shown]
[im 88/264  bone]
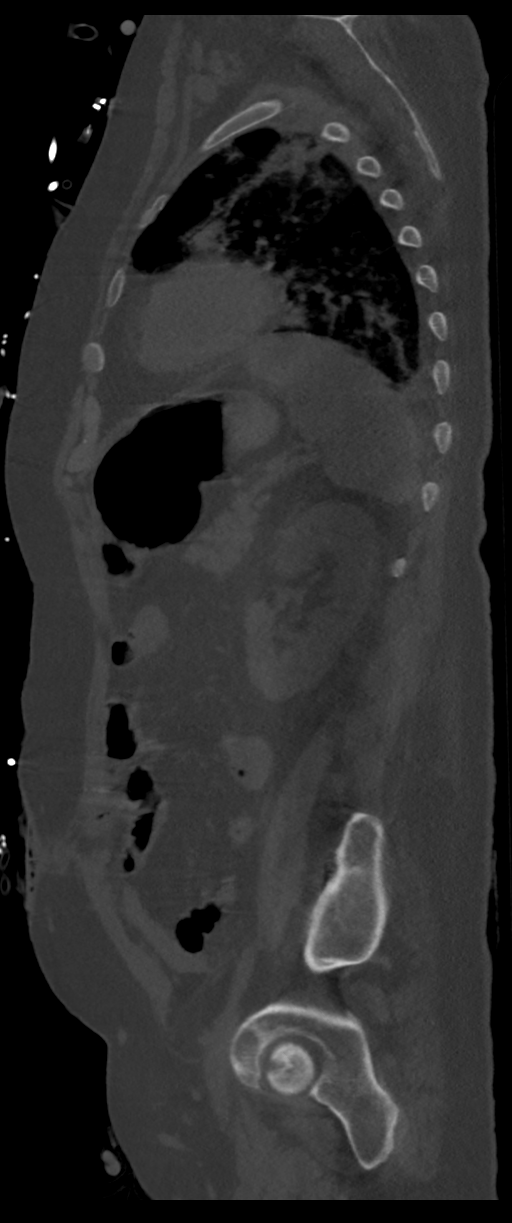
[im 110/264  bone]
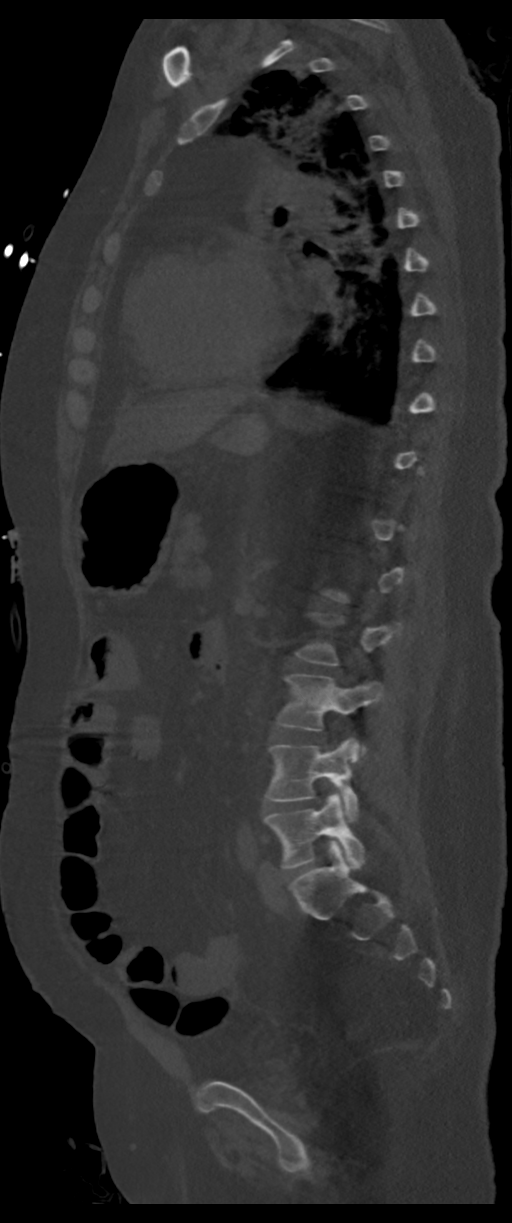
[im 132/264  bone]
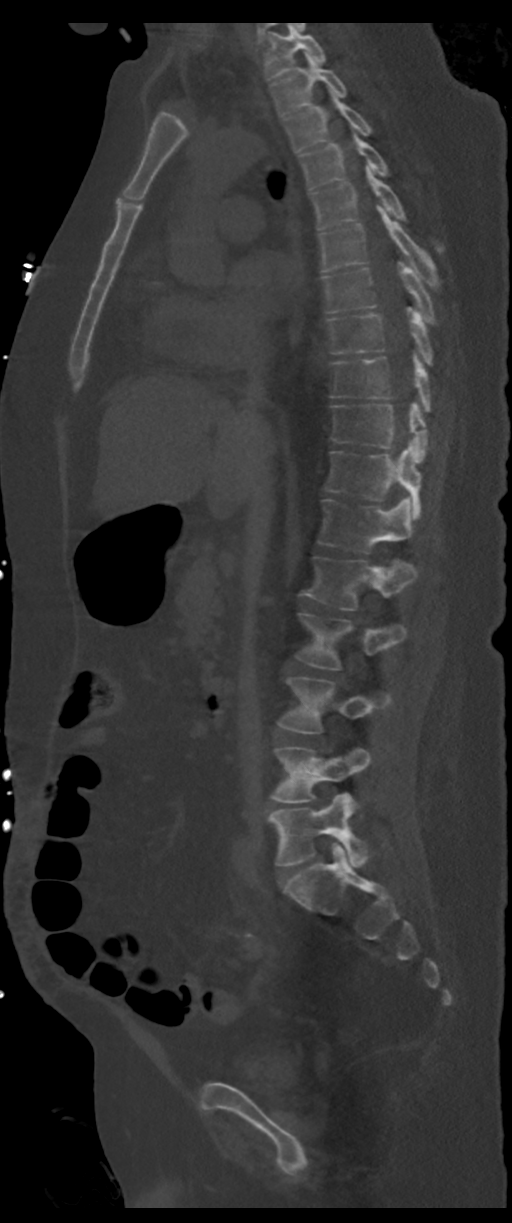
[im 154/264  bone]
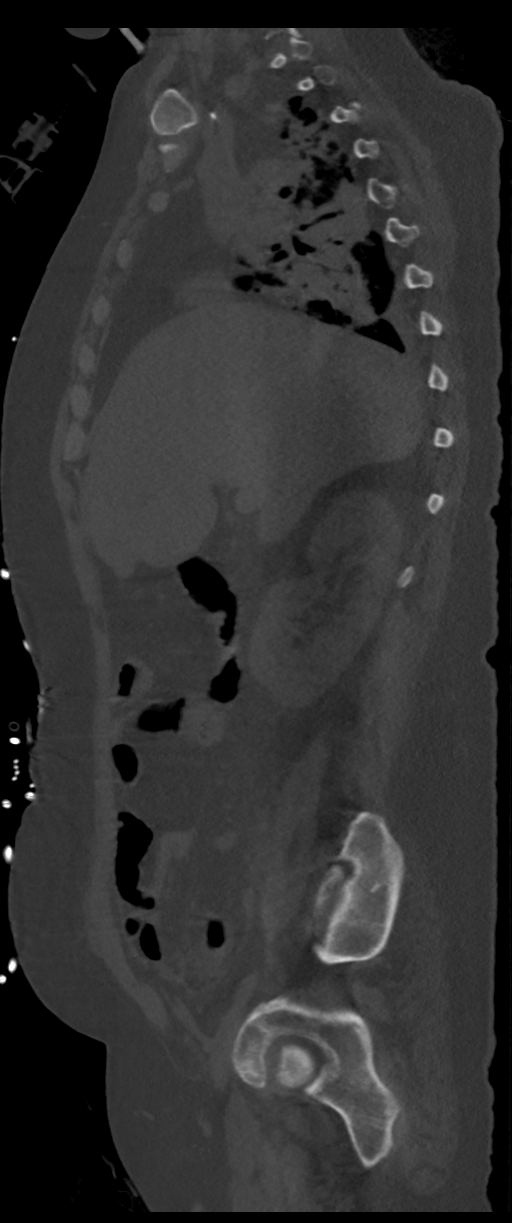
[im 176/264  bone]
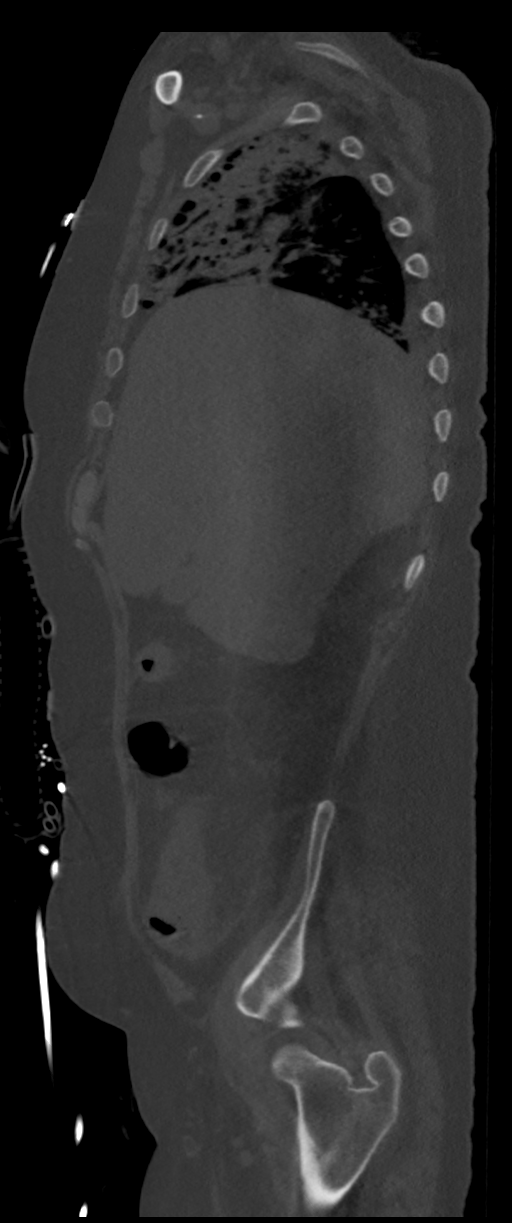

[9 of 34 positions shown; findings below may reference images not displayed]

FINDINGS: CT CHEST FINDINGS

Cardiovascular: The tracheostomy tube is in good position without
complicating features. Heart is mildly enlarged but stable. No
pericardial effusion. The aorta is normal in caliber.

Mediastinum/Nodes: Scattered mediastinal and hilar lymph nodes
likely reactive given the lung findings. The esophagus is grossly
normal.

Lungs/Pleura: Extensive bilateral airspace process in the lungs
suggesting infection/pneumonia. Small bilateral pleural effusions.
No pneumothorax. Some debris is noted in the right mainstem
bronchus.

Musculoskeletal: No significant bony findings.

CT ABDOMEN PELVIS FINDINGS

Hepatobiliary: No hepatic lesions are identified without contrast.
The hepatic contour is irregular and the caudate lobe is enlarged
suggesting cirrhosis. Gallbladder is contracted. No common bile duct
dilatation.

Pancreas: No mass, inflammation or ductal dilatation.

Spleen: Splenomegaly. Spleen measures 15 x 14 x 10 cm. No lesions
are identified.

Adrenals/Urinary Tract: Adrenal glands and kidneys are unremarkable.
The bladder is grossly normal. There is some gas in the bladder
likely from recent catheterization.

Stomach/Bowel: The stomach contains a feeding gastrostomy tube. No
complicating features are identified. Duodenum and small bowel are
grossly normal. There are surgical changes involving the distal
ileum along with a left lower quadrant colostomy and a Hartmann's
pouch. Suspect moderate inflammatory process involving the right
colon with colonic wall thickening and pericolonic interstitial
changes. I do not see a definite intra-abdominal/intrapelvic abscess
or free air.

Vascular/Lymphatic: The aorta and branch vessels are grossly normal.
No mesenteric or retroperitoneal mass or adenopathy. Scattered lymph
nodes are likely reactive.

Reproductive: The prostate gland and seminal vesicles are
unremarkable.

Other: No free abdominal or free pelvic fluid collections to suggest
an abscess. No pelvic adenopathy.

Moderate subcutaneous edema and skin thickening and both hip and
thigh regions possibly reflecting cellulitis.

Musculoskeletal: No acute bony findings. Chronic changes of
bilateral hip AVN.
IMPRESSION: 1. Extensive bilateral airspace process suggesting
infection/pneumonia.
2. Small bilateral pleural effusions.
3. Tracheostomy tube and feeding gastrostomy tube in good position
without complicating features.
4. Suspect moderate inflammatory process involving the right colon
with colonic wall thickening and pericolonic interstitial changes.
No definite intra-abdominal/intrapelvic abscess or free air.
5. Suspect cirrhosis and associated splenomegaly.
6. Moderate subcutaneous edema and skin thickening and both hip and
thigh regions possibly reflecting cellulitis.
7. Changes of chronic bilateral hip AVN.

## 2020-09-22 IMAGING — CT CT CHEST W/O CM
3 of 4 series · 9 of 34 positions shown, 10 images · non-contrast
Comparison: CT scan [DATE]

CLINICAL DATA: History of perforated diverticulitis and COVID
induced ARDS. Evaluate for possible retroperitoneal hematoma.

EXAM:
CT CHEST, ABDOMEN AND PELVIS WITHOUT CONTRAST
TECHNIQUE: Multidetector CT imaging of the chest, abdomen and pelvis was
performed following the standard protocol without IV contrast.

[Series 4: cap wo st · axial · 0.98mm/px · z∈[-645,-645]mm · 1 of 139 slices shown, 2 images]
[im 70/139  soft-tissue]
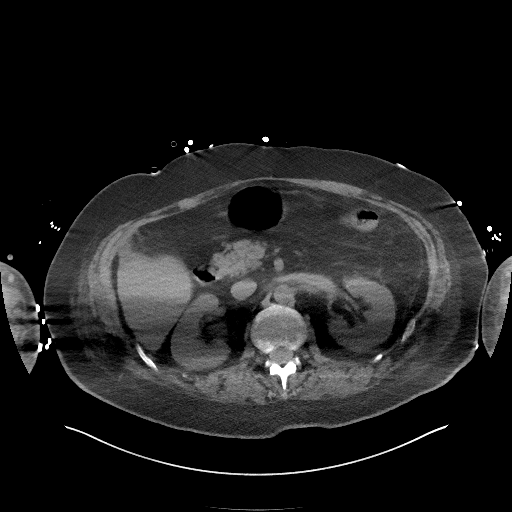
[im 70/139  bone]
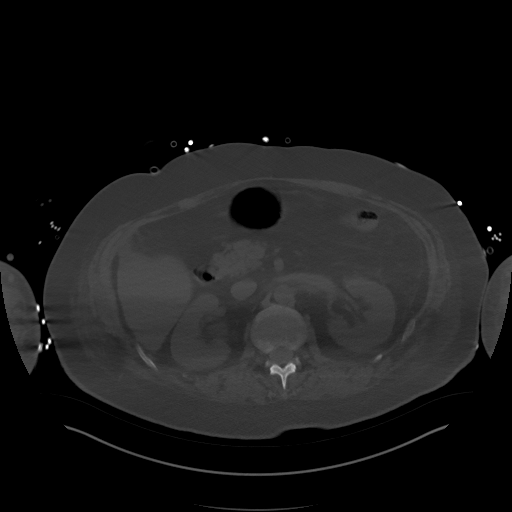

[Series 7: coronal · coronal · 1.03mm/px · 3 of 151 slices shown]
[im 31/151  bone]
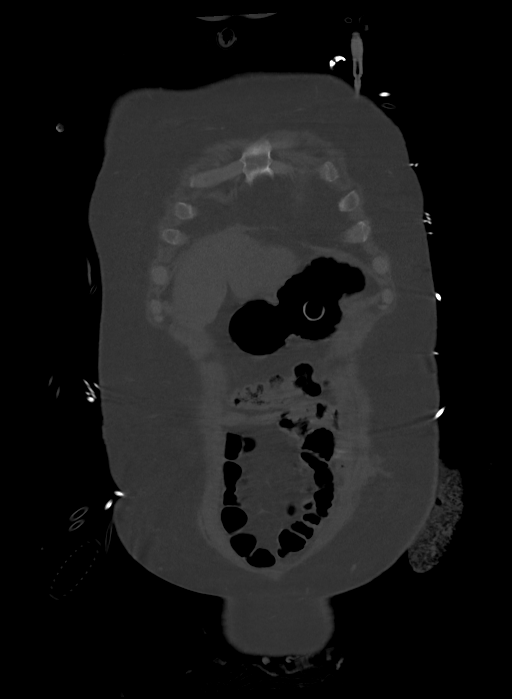
[im 61/151  bone]
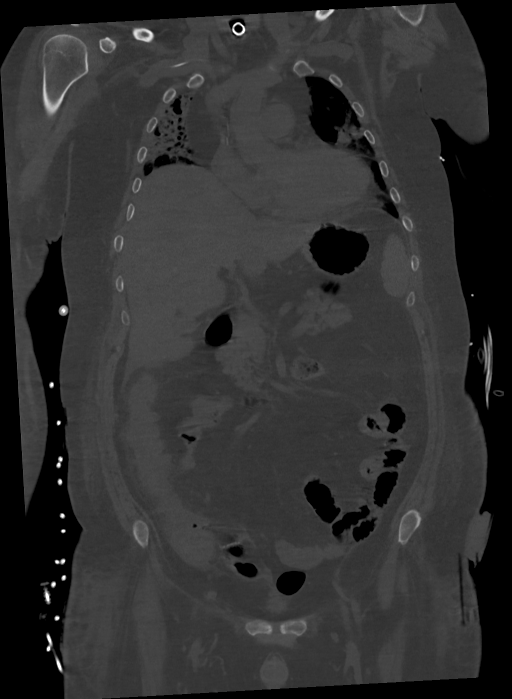
[im 91/151  bone]
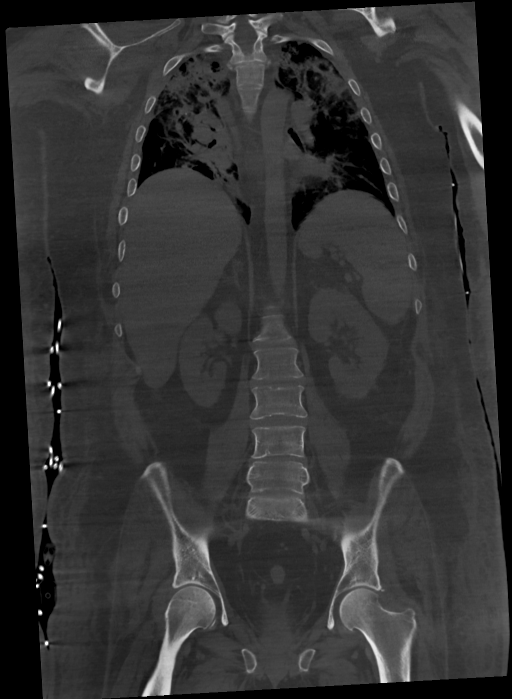

[Series 8: sagittal · sagittal · 0.59mm/px · 5 of 264 slices shown]
[im 88/264  bone]
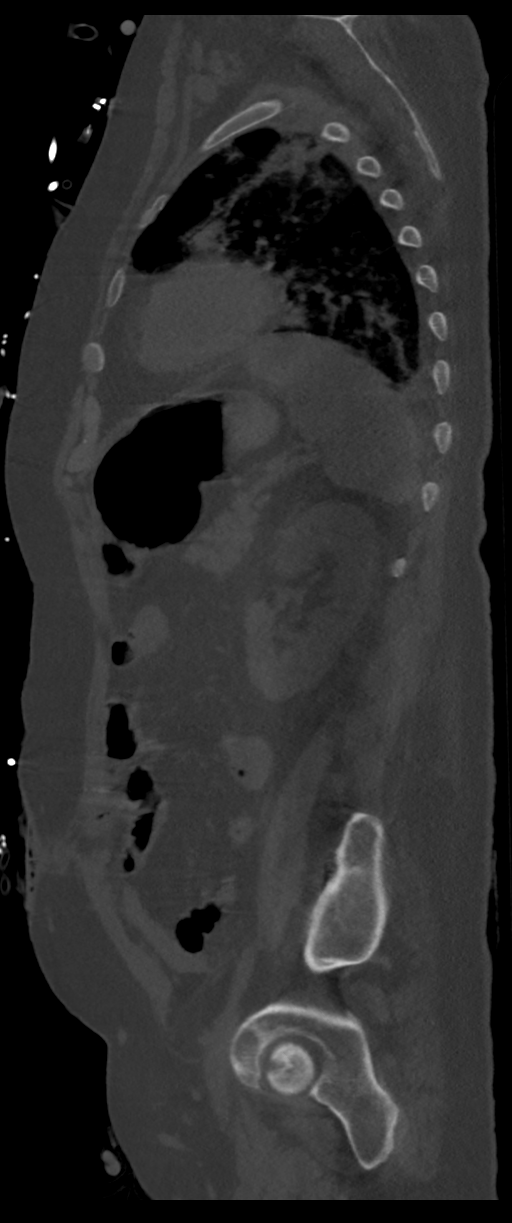
[im 110/264  bone]
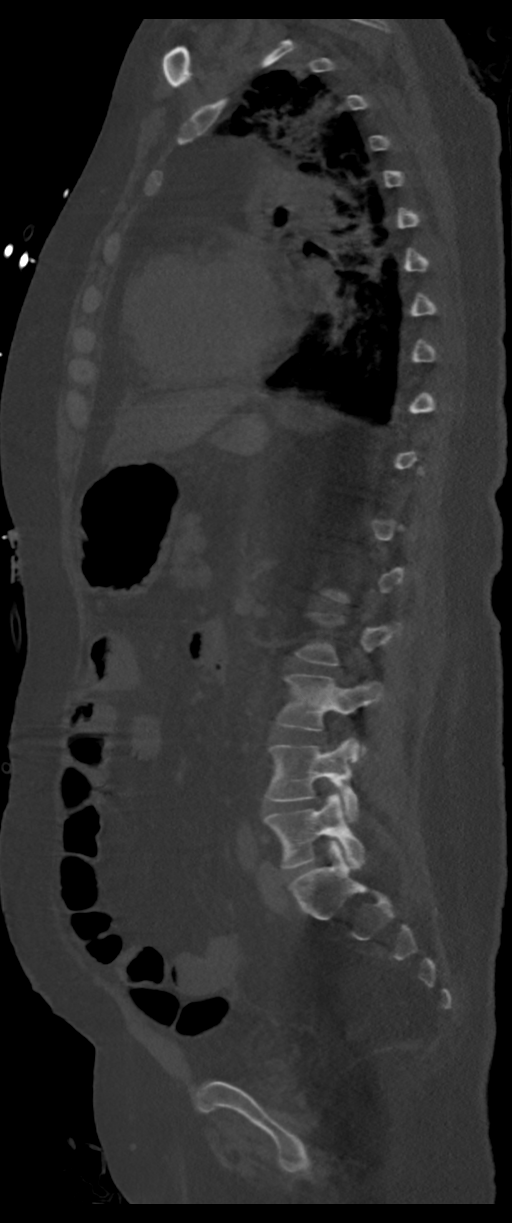
[im 132/264  bone]
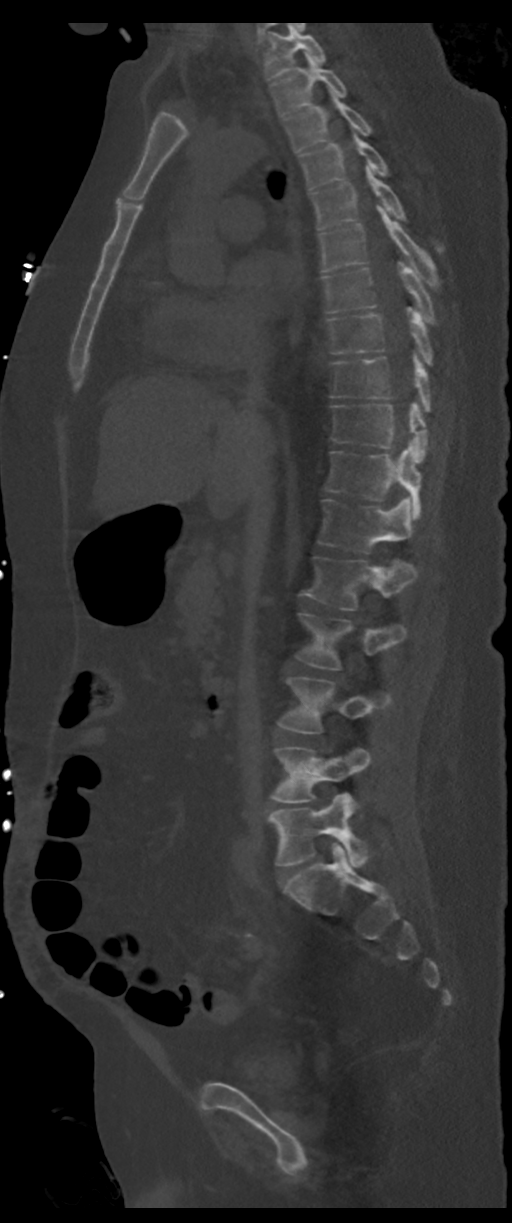
[im 154/264  bone]
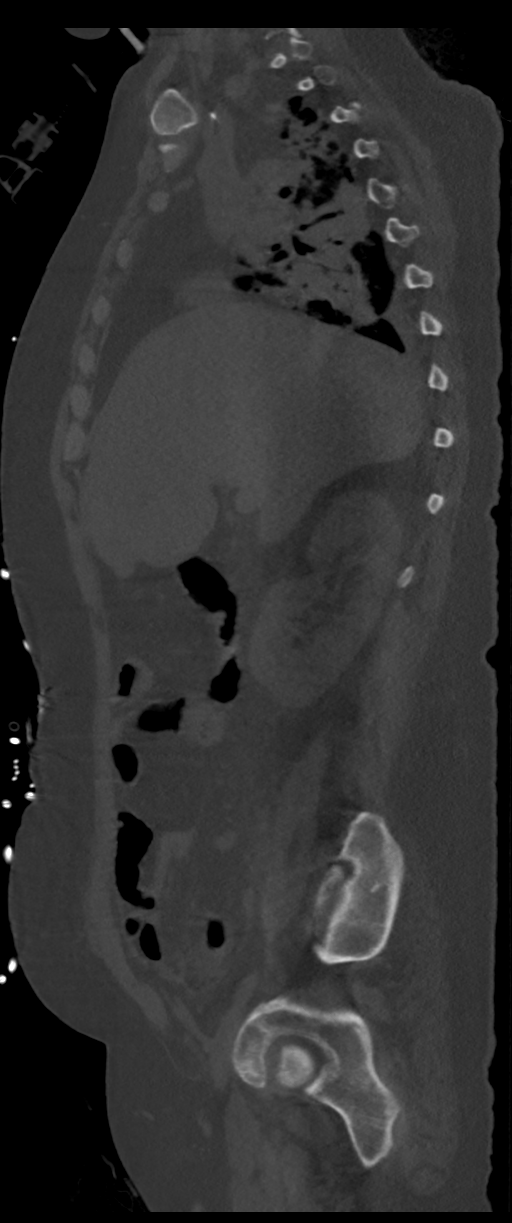
[im 176/264  bone]
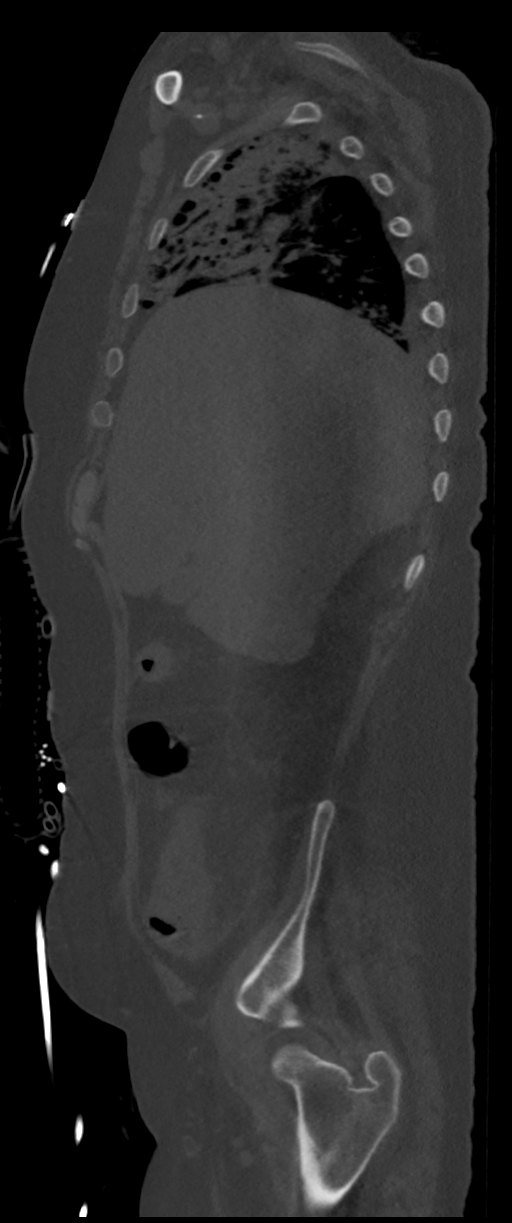

[9 of 34 positions shown; findings below may reference images not displayed]

FINDINGS: CT CHEST FINDINGS

Cardiovascular: The tracheostomy tube is in good position without
complicating features. Heart is mildly enlarged but stable. No
pericardial effusion. The aorta is normal in caliber.

Mediastinum/Nodes: Scattered mediastinal and hilar lymph nodes
likely reactive given the lung findings. The esophagus is grossly
normal.

Lungs/Pleura: Extensive bilateral airspace process in the lungs
suggesting infection/pneumonia. Small bilateral pleural effusions.
No pneumothorax. Some debris is noted in the right mainstem
bronchus.

Musculoskeletal: No significant bony findings.

CT ABDOMEN PELVIS FINDINGS

Hepatobiliary: No hepatic lesions are identified without contrast.
The hepatic contour is irregular and the caudate lobe is enlarged
suggesting cirrhosis. Gallbladder is contracted. No common bile duct
dilatation.

Pancreas: No mass, inflammation or ductal dilatation.

Spleen: Splenomegaly. Spleen measures 15 x 14 x 10 cm. No lesions
are identified.

Adrenals/Urinary Tract: Adrenal glands and kidneys are unremarkable.
The bladder is grossly normal. There is some gas in the bladder
likely from recent catheterization.

Stomach/Bowel: The stomach contains a feeding gastrostomy tube. No
complicating features are identified. Duodenum and small bowel are
grossly normal. There are surgical changes involving the distal
ileum along with a left lower quadrant colostomy and a Hartmann's
pouch. Suspect moderate inflammatory process involving the right
colon with colonic wall thickening and pericolonic interstitial
changes. I do not see a definite intra-abdominal/intrapelvic abscess
or free air.

Vascular/Lymphatic: The aorta and branch vessels are grossly normal.
No mesenteric or retroperitoneal mass or adenopathy. Scattered lymph
nodes are likely reactive.

Reproductive: The prostate gland and seminal vesicles are
unremarkable.

Other: No free abdominal or free pelvic fluid collections to suggest
an abscess. No pelvic adenopathy.

Moderate subcutaneous edema and skin thickening and both hip and
thigh regions possibly reflecting cellulitis.

Musculoskeletal: No acute bony findings. Chronic changes of
bilateral hip AVN.
IMPRESSION: 1. Extensive bilateral airspace process suggesting
infection/pneumonia.
2. Small bilateral pleural effusions.
3. Tracheostomy tube and feeding gastrostomy tube in good position
without complicating features.
4. Suspect moderate inflammatory process involving the right colon
with colonic wall thickening and pericolonic interstitial changes.
No definite intra-abdominal/intrapelvic abscess or free air.
5. Suspect cirrhosis and associated splenomegaly.
6. Moderate subcutaneous edema and skin thickening and both hip and
thigh regions possibly reflecting cellulitis.
7. Changes of chronic bilateral hip AVN.

## 2020-09-22 IMAGING — CT CT CERVICAL SPINE W/O CM
3 of 4 series · 12 of 33 positions shown, 14 images · non-contrast
Comparison: None.

CLINICAL DATA: Epidural abscess.

EXAM:
CT CERVICAL SPINE WITHOUT CONTRAST
TECHNIQUE: Multidetector CT imaging of the cervical spine was performed without
intravenous contrast. Multiplanar CT image reconstructions were also
generated.

[Series 8: sagittal bone · sagittal · 0.28mm/px · 5 of 58 slices shown, 6 images]
[im 20/58  bone]
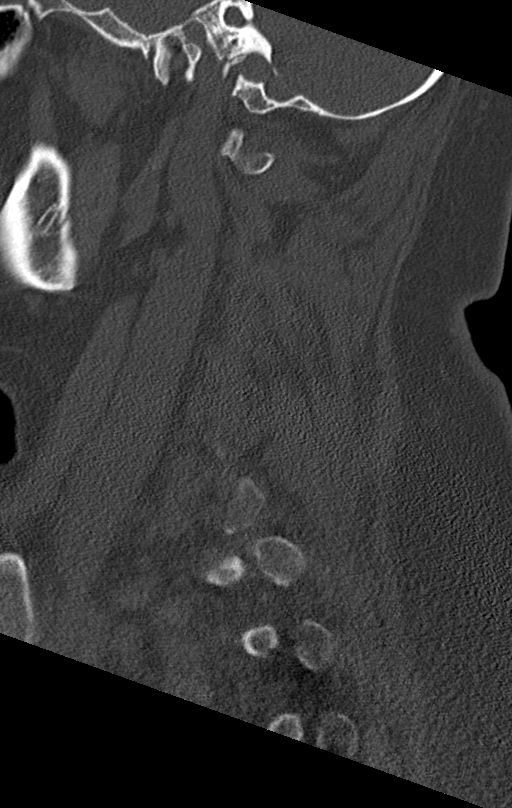
[im 24/58  bone]
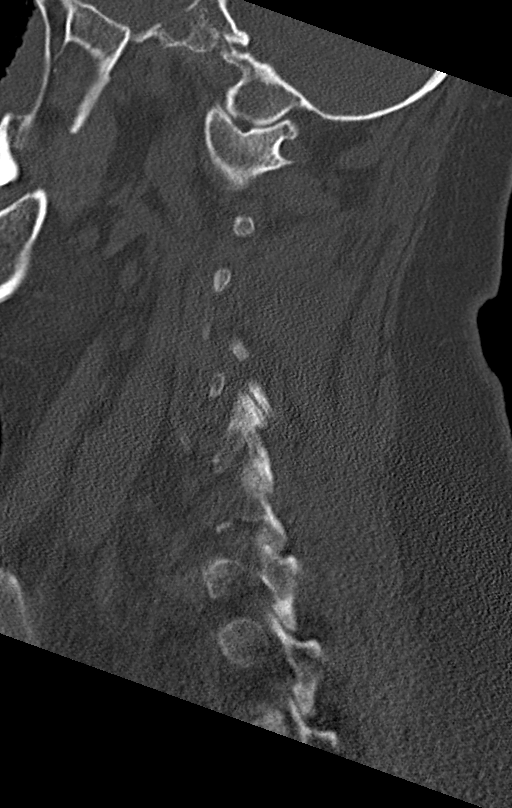
[im 29/58  soft-tissue]
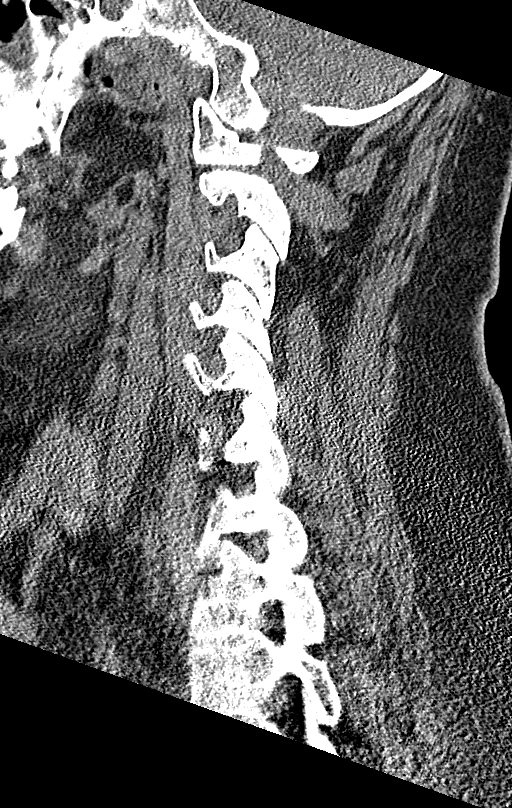
[im 29/58  bone]
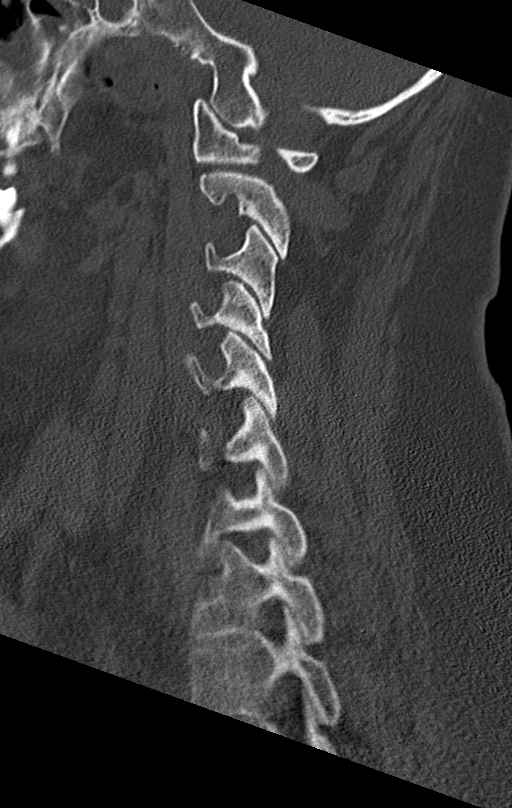
[im 34/58  bone]
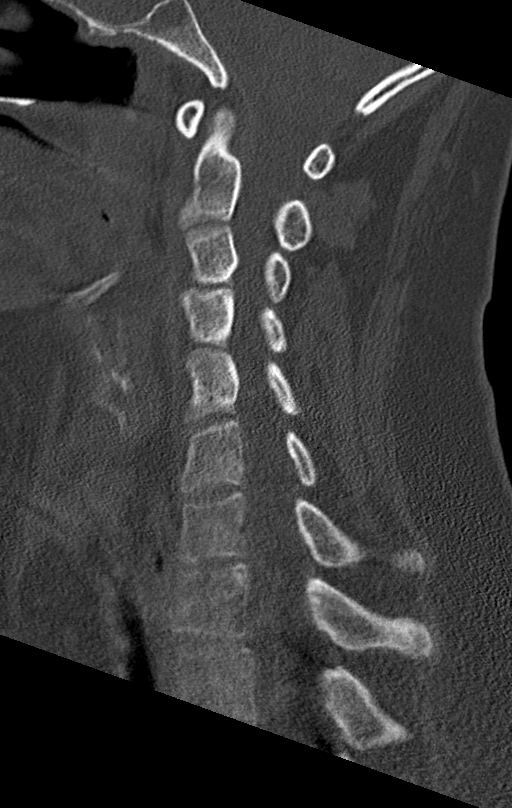
[im 39/58  bone]
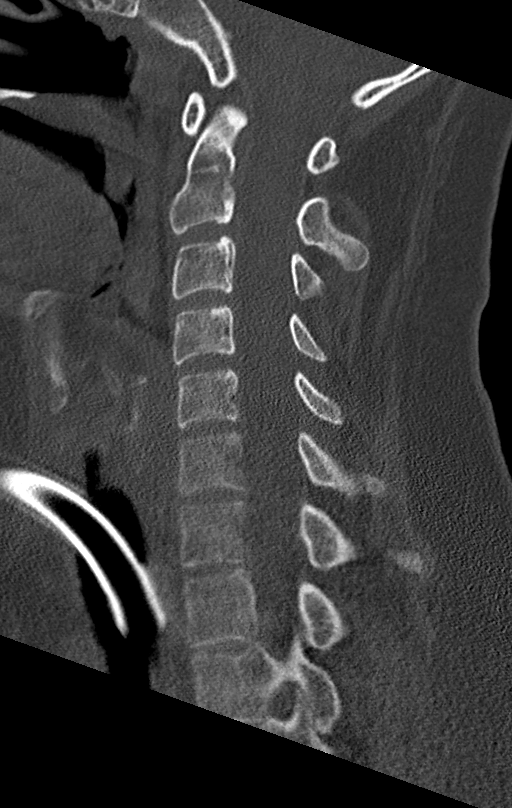

[Series 9: coronal bone · coronal · 0.32mm/px · 3 of 62 slices shown]
[im 13/62  bone]
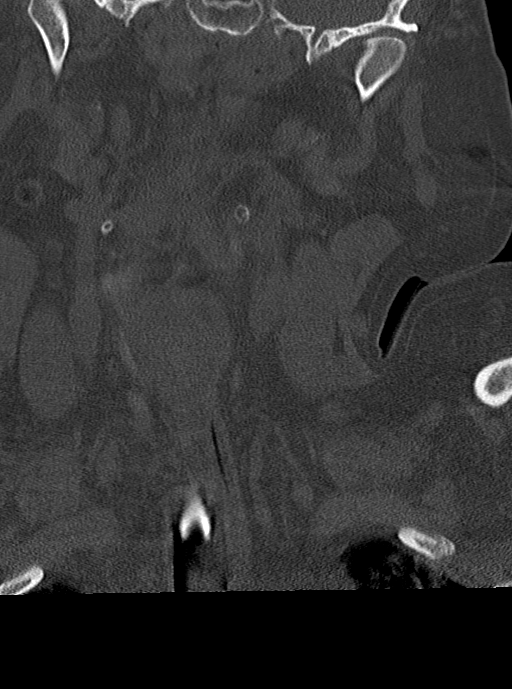
[im 25/62  bone]
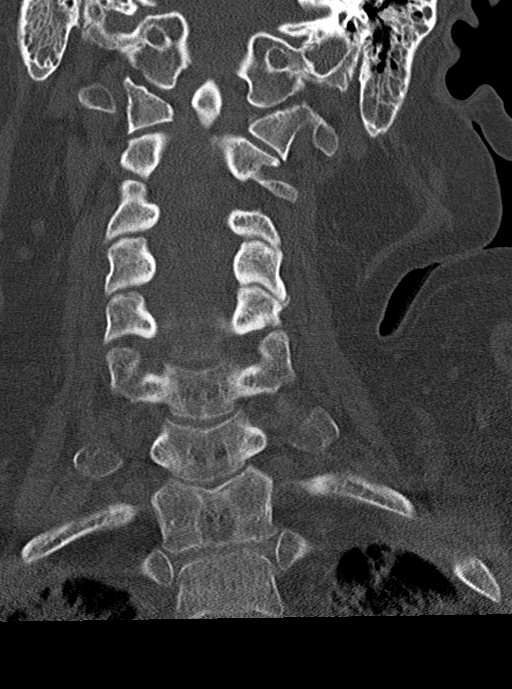
[im 37/62  bone]
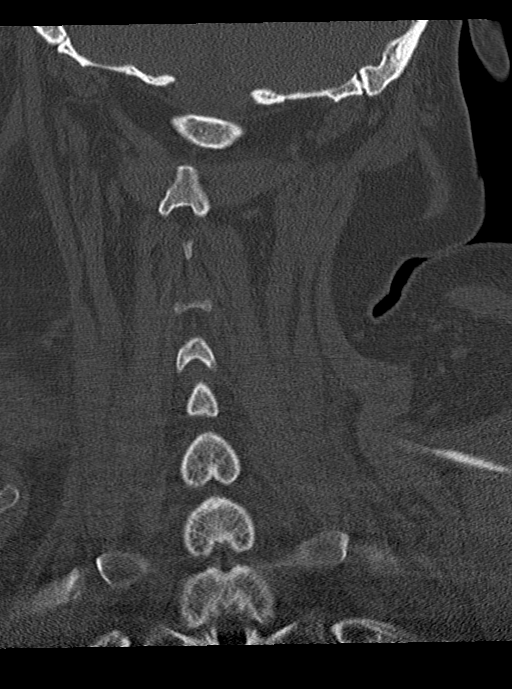

[Series 10: orthogonal bone · axial · 0.27mm/px · z∈[-364,-195]mm · 4 of 128 slices shown, 5 images]
[im 19/128  soft-tissue]
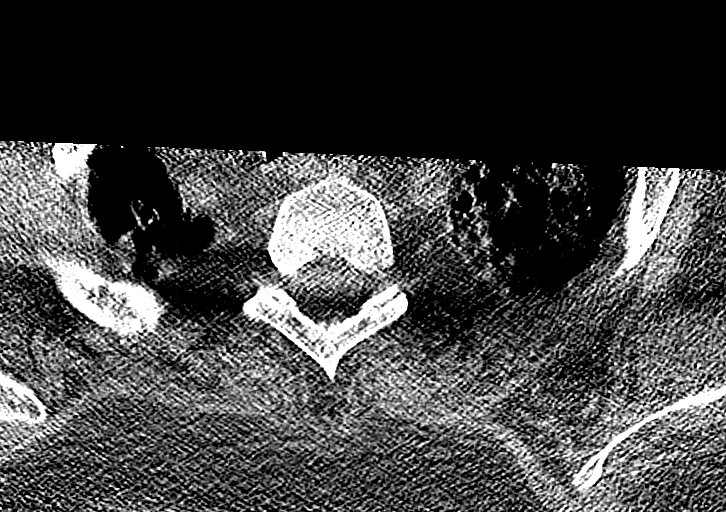
[im 19/128  bone]
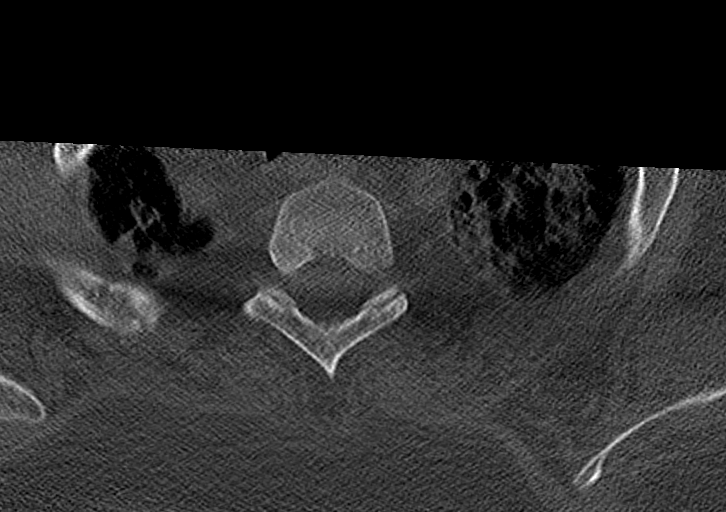
[im 55/128  bone]
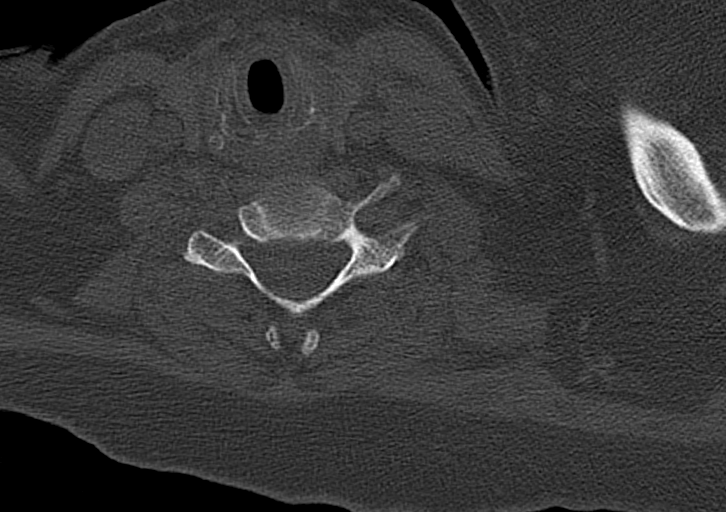
[im 73/128  bone]
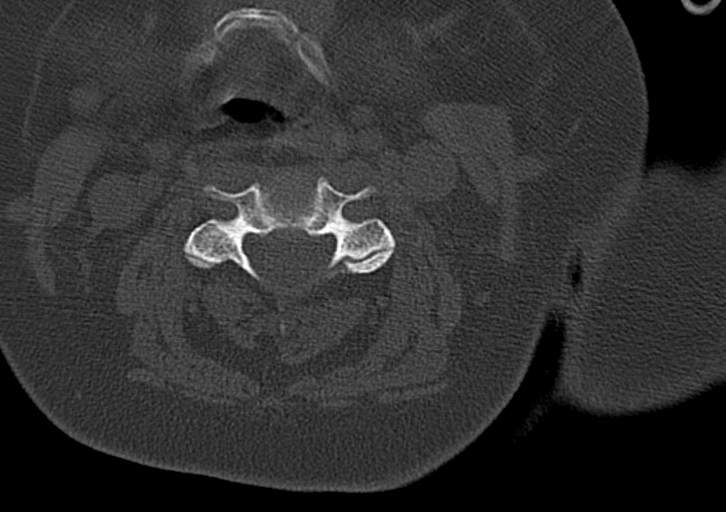
[im 109/128  bone]
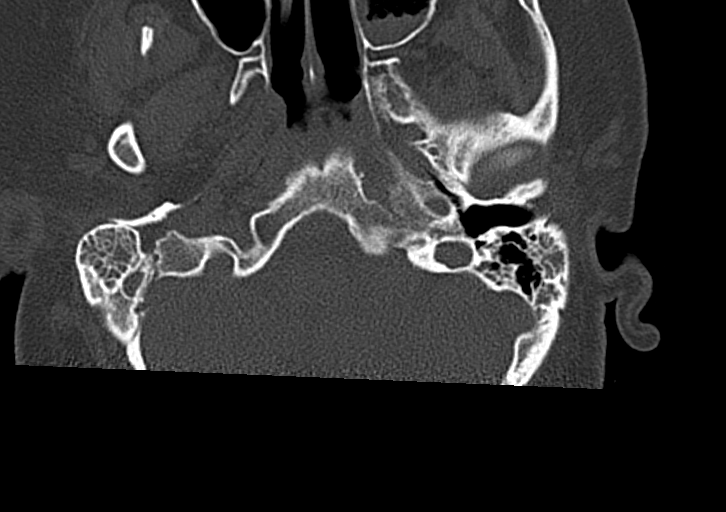

[12 of 33 positions shown; findings below may reference images not displayed]

FINDINGS: Alignment: Normal

Skull base and vertebrae: No acute fracture. No primary bone lesion
or focal pathologic process.

Soft tissues and spinal canal: No prevertebral fluid or swelling. No
visible canal hematoma.

Disc levels: Very generous spinal canal. No large disc protrusions,
spinal or foraminal stenosis. No findings suspicious for epidural
hematoma or abscess.

Upper chest: Significant bilateral lung infiltrates.

Other: Tracheostomy tube in good position.
IMPRESSION: 1. Normal alignment and no acute bony findings.
2. Very generous spinal canal. No findings suspicious for epidural
hematoma or abscess.
3. Significant bilateral lung infiltrates.

## 2020-09-22 MED ORDER — VECURONIUM BROMIDE 10 MG IV SOLR
INTRAVENOUS | Status: AC
  Administered 2020-09-22: 10 mg
  Filled 2020-09-22: qty 10

## 2020-09-22 MED ORDER — MIDODRINE HCL 5 MG PO TABS
5.0000 mg | ORAL_TABLET | Freq: Three times a day (TID) | ORAL | Status: DC
  Administered 2020-09-22 – 2020-09-24 (×3): 5 mg
  Filled 2020-09-22 (×4): qty 1

## 2020-09-22 MED ORDER — MIDAZOLAM HCL 2 MG/2ML IJ SOLN
INTRAMUSCULAR | Status: AC
  Administered 2020-09-22: 4 mg
  Filled 2020-09-22: qty 4

## 2020-09-22 MED ORDER — VECURONIUM BROMIDE 10 MG IV SOLR: 10.0000 mg | Freq: Once | INTRAVENOUS | Status: DC

## 2020-09-22 MED ORDER — MIDAZOLAM HCL 2 MG/2ML IJ SOLN: 4.0000 mg | Freq: Once | INTRAMUSCULAR | Status: AC

## 2020-09-22 MED ORDER — FENTANYL CITRATE (PF) 100 MCG/2ML IJ SOLN
INTRAMUSCULAR | Status: AC
  Administered 2020-09-22: 200 ug
  Filled 2020-09-22: qty 4

## 2020-09-22 MED ORDER — PIPERACILLIN-TAZOBACTAM 3.375 G IVPB
3.3750 g | Freq: Three times a day (TID) | INTRAVENOUS | Status: DC
  Administered 2020-09-22 – 2020-09-24 (×6): 3.375 g via INTRAVENOUS
  Filled 2020-09-22 (×7): qty 50

## 2020-09-22 MED ORDER — SODIUM CHLORIDE 0.9% IV SOLUTION: Freq: Once | INTRAVENOUS | Status: AC

## 2020-09-22 MED ORDER — MAGNESIUM SULFATE 2 GM/50ML IV SOLN
2.0000 g | Freq: Once | INTRAVENOUS | Status: AC
  Administered 2020-09-22: 2 g via INTRAVENOUS
  Filled 2020-09-22: qty 50

## 2020-09-22 MED ORDER — FENTANYL CITRATE (PF) 100 MCG/2ML IJ SOLN
100.0000 ug | INTRAMUSCULAR | Status: DC | PRN
Start: 2020-09-22 — End: 2020-09-24
  Administered 2020-09-22 – 2020-09-23 (×3): 100 ug via INTRAVENOUS
  Filled 2020-09-22 (×3): qty 2

## 2020-09-22 MED ORDER — SODIUM CHLORIDE 0.9 % IV SOLN
0.0000 ug/min | INTRAVENOUS | Status: DC
  Filled 2020-09-22 (×2): qty 10

## 2020-09-22 MED ORDER — CEFAZOLIN SODIUM-DEXTROSE 2-4 GM/100ML-% IV SOLN
2.0000 g | Freq: Three times a day (TID) | INTRAVENOUS | Status: DC
  Administered 2020-09-22: 2 g via INTRAVENOUS
  Filled 2020-09-22 (×5): qty 100

## 2020-09-22 MED ORDER — PHENYLEPHRINE CONCENTRATED 100MG/250ML (0.4 MG/ML) INFUSION SIMPLE
0.0000 ug/min | INTRAVENOUS | Status: DC
  Administered 2020-09-22: 200 ug/min via INTRAVENOUS
  Filled 2020-09-22 (×2): qty 250

## 2020-09-22 MED ORDER — FENTANYL CITRATE (PF) 100 MCG/2ML IJ SOLN: 200.0000 ug | Freq: Once | INTRAMUSCULAR | Status: AC

## 2020-09-22 MED ORDER — HYDROCORTISONE NA SUCCINATE PF 100 MG IJ SOLR
50.0000 mg | Freq: Four times a day (QID) | INTRAMUSCULAR | Status: DC
  Administered 2020-09-22 – 2020-09-23 (×4): 50 mg via INTRAVENOUS
  Filled 2020-09-22 (×4): qty 2

## 2020-09-22 NOTE — Progress Notes (Signed)
PT Cancellation Note  Patient Details Name: Craig [name] MRN: [id number] DOB: 03/14/1991   Cancelled Treatment:    Reason Eval/Treat Not Completed: Medical issues which prohibited therapy. Patient required increased pressor support per notes with latest blood pressure reading 99/32. Heart rate is also 139bpm at rest and hemoglobin of 6.6. PT will hold today and follow up as medically appropriate.   Minna Merritts, PT, MPT  [name] 09/22/2020, 11:24 AM

## 2020-09-22 NOTE — Progress Notes (Signed)
[facility] Day(s): 44.   Post op day(s): 8 Days Post-Op.   Interval History: Patient seen and examined.  I was contacted by Dr. Mortimer Fries for evaluation of recurrent fever and multiple finding of abdominal CT scan.  Patient is critically ill on mechanical ventilation and sedation.  Unable to take history from patient.  History taken from ICU team.  Vital signs in last 24 hours: [min-max] current  Temp:  [99.1 F (37.3 C)-101.9 F (38.8 C)] 99.8 F (37.7 C) (02/03 1657) Pulse Rate:  [107-142] 107 (02/03 1657) Resp:  [17-54] 24 (02/03 1657) BP: (78-144)/(32-84) 131/71 (02/03 1657) SpO2:  [88 %-100 %] 96 % (02/03 1657) FiO2 (%):  [40 %] 40 % (02/03 1515) Weight:  [120.3 kg] 120.3 kg (02/03 0454)     Height: 6' 0.01" (182.9 cm) Weight: 120.3 kg BMI (Calculated): 35.96   Physical Exam:  Constitutional: Critically ill sedated on mechanical ventilation. Respiratory: breathing non-labored on mechanical ventilation Cardiovascular: Tachycardia Gastrointestinal: Soft and depressible, ileostomy pink and patent.  There is significant stool in the ileostomy bag. Extremities: There is severe anasarca in all extremities up to the hips and back.  There is no redness on the skin.  There is no crepitance on palpation  Labs:  CBC Latest Ref Rng & Units 09/22/2020 09/21/2020 09/19/2020  WBC 4.0 - 10.5 K/uL 8.0 7.3 7.9  Hemoglobin 13.0 - 17.0 g/dL 6.6(L) 7.4(L) 7.7(L)  Hematocrit 39.0 - 52.0 % 22.6(L) 24.5(L) 25.1(L)  Platelets 150 - 400 K/uL 302 246 154   CMP Latest Ref Rng & Units 09/22/2020 09/21/2020 09/20/2020  Glucose 70 - 99 mg/dL 111(H) 110(H) 172(H)  BUN 6 - 20 mg/dL 5(L) 6 7  Creatinine 0.61 - 1.24 mg/dL <0.30(L) <0.30(L) <0.30(L)  Sodium 135 - 145 mmol/L 139 138 139  Potassium 3.5 - 5.1 mmol/L 4.0 4.1 4.2  Chloride 98 - 111 mmol/L 97(L) 96(L) 96(L)  CO2 22 - 32 mmol/L 33(H) 32 32  Calcium 8.9 - 10.3 mg/dL 8.0(L) 8.2(L) 8.1(L)  Total Protein 6.5 - 8.1 g/dL - - -  Total  Bilirubin 0.3 - 1.2 mg/dL - - -  Alkaline Phos 38 - 126 U/L - - -  AST 15 - 41 U/L - - -  ALT 0 - 44 U/L - - -    Imaging studies: I personally evaluated the CT scan of the abdominal pelvis.  There is expected postoperative changes from previous Hartman's procedure.  There is no intra-abdominal fluid collection.  There is no fat stranding.  There is no free air or free fluid.   Assessment/Plan:  30 y.o. male with recurrent sepsis and fever I was consulted for evaluation of abdominal pelvic CT scan and possible causes of intra-abdominal infections.  From my evaluation when correlating the CT scan with the physical exam there is no concern of fasciitis or cellulitis of the hips or lower extremities.  There is severe anasarca.  Intra-abdominally there is no sign of abscess, fluid collection, free air or fat stranding to suggest intra-abdominal infection.  I agree with ICU team to continue looking for other causes of fever and sepsis.  I will be available if there is any change on his abdominal status.  No contraindication for enteral feeding from surgical standpoint.  Arnold Long, MD

## 2020-09-22 NOTE — Progress Notes (Signed)
CRITICAL CARE NOTE 30 yo male recent hospitalization for perforated diverticulitis s/p colostomy with reversal and leakage of anastomosis with s/p revision now acutely hypoxemic with COVID19 induced severe ARDS.  INTERVAL EVENTS:  08/11/20-patient continued to decline with worsening respiratory status despite 100%Fio2 on BIPAP and HFNC failure. He stated that he feels he is dying and cannot breathe. I discussed with wife [name] earlier today that patient is critically ill may need ETT, she is agreeable and thankful for care. I specifically discussed and explained intubation and mechanical ventilation to patient and he wishes to proceed.  08/12/20- patient was weaned on FiO2 to 60%. Spoke to wife [name].  08/13/20- Patient weaned to 40%. During SBT today he self extubated and was placed on HFNC. He subsequently became hypoxemic, disoriented aggitated, removed PIVs and colostomy and proceeded to have combative behavior with worsening oxygenation. I have attempted to calm patient and encouraged him to cooperate. After numerous attempts patient continued to be combative with severe aggitation with combative behavior and would not wear oxygen. Patient was placed on sedation and MV. Wife updated.  08/14/20- patient remains critically ill. Weaned from 100%>>55%. Called and updated wife [name] today  12/27-Patient with worsening oxygenation on FiO2 100% peep of 14  08/16/20-Patient with continued high oxygen requirements overnight. Have spoken with surgery and there should be no problem proning patient with this. Was febrile yesterday and thus broad spectrum abx started and cultures attained.  08-17-20-patient down to PEEP 10 FiO2 55%. Patient never required proning  08/18/20: FiO2 weaned to 40%, PEEP 10, plan for diuresis with Diamox today, adding PO Clonazepam to assist weaning sedation  08/19/20: Vent changed to Pressure Control this morning: 50% FiO2, 24, 22/5, Plan for Recruitment maneuvers and diuresis   08/20/2020: Continued issues with hypercapnia due to the dead space ventilation. Worsening despite pressure control. Switch back to [facility], prone positioning  08/23/19- patient proned today without incident, continue to wean FiO2 , currently on 50%  08/24/19- patient weaned to 40% FiO2, plan to continue proning with full scope of care. I have spoken to [name] (wife of patient today) she would like to be present prior to next extubation attempt to help console patient and decrease his aggitation.  1/5 failed weaning trials, ver agitated , severe hypoxia, Wife at bedside witnessed failed SAT.  1/6 severe resp failure, hypoxia  1/7 proning for 16 hrs, severe ARDS  1/8 PICC line placed, Had a brief PEA arrest while prone. Proning discontinued  1/10 severe resp failure  1/11 severe ARDS, ENT consulted fro [facility]  1/12 severe resp failure  09/02/20- patient with no acute events. Spoke with [name] today answered questions and plan is for trache.  09/03/20- patient remains on MV. FiO2 down to 35%PRVC plan for trache.  09/04/20- patient with TG>1000 will switch off propofol but patient with very high sedation requirement, will discuss options with pharmD. Plan for trache. Met with wife [name] at bedside.  1/20 extubated, wife at bedside, very weak cough, encephalopathy  1/21 re-intubated, severe resp failure  1/23 needs [facility] for survival  1/24 needs trach, severe resp failure  1/25 s/p [facility]  1/26 s/p PEG tube  1/27 unable to do TEE today  1/28 TEE deferred; new anemia (possibly related to recent tracheostomy)  1/30 recurrent fever  1/31 remains on vent, failure to wean  2/1 remains febrile, severe agitation  2/2 started on pressors, high fevers, low h/H      CC  follow up respiratory failure  SUBJECTIVE Patient  remains critically ill Prognosis is guarded On pressors High fevers On vent US GB NEG      BP (!) 119/57   Pulse (!) 134   Temp (!) 100.7 F (38.2 C) (Axillary)   Resp (!)  27   Ht 6' 0.01" (1.829 m)   Wt 120.3 kg   SpO2 97%   BMI 35.96 kg/m    I/O last 3 completed shifts: In: 2701.1 [I.V.:906; PP/IR:5188.4; IV Piggyback:132.4] Out: 2075 [Urine:1625; Stool:450] Total I/O In: 1692.6 [I.V.:412.6; NG/GT:1280] Out: 0   SpO2: 97 % O2 Flow Rate (L/min): 0 L/min FiO2 (%): 40 %  Estimated body mass index is 35.96 kg/m as calculated from the following:   Height as of this encounter: 6' 0.01" (1.829 m).   Weight as of this encounter: 120.3 kg.  SIGNIFICANT EVENTS   REVIEW OF SYSTEMS  PATIENT IS UNABLE TO PROVIDE COMPLETE REVIEW OF SYSTEMS DUE TO SEVERE CRITICAL ILLNESS   Pressure Injury 09/17/20 Coccyx Medial Stage 2 -  Partial thickness loss of dermis presenting as a shallow open injury with a red, pink wound bed without slough. (Active)  09/17/20 1010  Location: Coccyx  Location Orientation: Medial  Staging: Stage 2 -  Partial thickness loss of dermis presenting as a shallow open injury with a red, pink wound bed without slough.  Wound Description (Comments):   Present on Admission:       PHYSICAL EXAMINATION:  GENERAL:critically ill appearing, +resp distress EYES: Pupils equal, round, reactive to light.  No scleral icterus.  MOUTH: Moist mucosal membrane. NECK: Supple.  PULMONARY: +rhonchi,  CARDIOVASCULAR: S1 and S2. Regular rate and rhythm. No murmurs, rubs, or gallops.  GASTROINTESTINAL: Soft, nontender, -distended.  Positive bowel sounds.   MUSCULOSKELETAL: +edema.  NEUROLOGIC: more alert and awake SKIN:intact,warm,dry  MEDICATIONS: I have reviewed all medications and confirmed regimen as documented   CULTURE RESULTS   Recent Results (from the past 240 hour(s))  CULTURE, BLOOD (ROUTINE X 2) w Reflex to ID Panel     Status: None   Collection Time: 09/16/20  9:46 AM   Specimen: BLOOD LEFT HAND  Result Value Ref Range Status   Specimen Description BLOOD LEFT HAND  Final   Special Requests   Final    BOTTLES DRAWN AEROBIC ONLY  Blood Culture results may not be optimal due to an inadequate volume of blood received in culture bottles   Culture   Final    NO GROWTH 5 DAYS Performed at [facility], [address] [place], [place] [address]    Report Status 09/21/2020 FINAL  Final  CULTURE, BLOOD (ROUTINE X 2) w Reflex to ID Panel     Status: None   Collection Time: 09/16/20  9:52 AM   Specimen: Joint, Finger; Blood  Result Value Ref Range Status   Specimen Description FINGER LEFT BLOOD  Final   Special Requests   Final    BOTTLES DRAWN AEROBIC AND ANAEROBIC Blood Culture adequate volume   Culture   Final    NO GROWTH 5 DAYS Performed at [facility], [address], [place], [place] [address]    Report Status 09/21/2020 FINAL  Final  Culture, respiratory (non-expectorated)     Status: None   Collection Time: 09/16/20 12:20 PM   Specimen: Tracheal Aspirate; Respiratory  Result Value Ref Range Status   Specimen Description   Final    TRACHEAL ASPIRATE Performed at [facility], [address], [place], [place] [address]    Special Requests  Final    NONE Performed at [facility], [place], [place] [address]    Gram Stain   Final    RARE WBC PRESENT,BOTH PMN AND MONONUCLEAR MODERATE GRAM POSITIVE COCCI IN PAIRS Performed at [facility], [place] [address], [place], [place] [address]    Culture MODERATE STAPHYLOCOCCUS AUREUS  Final   Report Status 09/18/2020 FINAL  Final   Organism ID, Bacteria STAPHYLOCOCCUS AUREUS  Final      Susceptibility   Staphylococcus aureus - MIC*    CIPROFLOXACIN <=0.5 SENSITIVE Sensitive     ERYTHROMYCIN >=8 RESISTANT Resistant     GENTAMICIN <=0.5 SENSITIVE Sensitive     OXACILLIN 0.5 SENSITIVE Sensitive     TETRACYCLINE <=1 SENSITIVE Sensitive     VANCOMYCIN <=0.5 SENSITIVE Sensitive     TRIMETH/SULFA <=10 SENSITIVE Sensitive     CLINDAMYCIN RESISTANT Resistant     RIFAMPIN <=0.5 SENSITIVE Sensitive      Inducible Clindamycin POSITIVE Resistant     * MODERATE STAPHYLOCOCCUS AUREUS  Aerobic Culture (superficial specimen)     Status: None   Collection Time: 09/16/20  2:23 PM   Specimen: Skin, Cyst/Tag/Debridement; Wound  Result Value Ref Range Status   Specimen Description   Final    SKIN Performed at [facility], [address] [place], [place] [address]    Special Requests   Final    NONE Performed at [facility], [address] [place], [place] [address]    Gram Stain   Final    NO WBC SEEN MODERATE GRAM POSITIVE COCCI IN PAIRS FEW GRAM NEGATIVE RODS    Culture   Final    MODERATE STAPHYLOCOCCUS AUREUS WITHIN MIXED CULTURE Performed at [facility], [place] [address] [place], [place] [address]    Report Status 09/20/2020 FINAL  Final   Organism ID, Bacteria STAPHYLOCOCCUS AUREUS  Final      Susceptibility   Staphylococcus aureus - MIC*    CIPROFLOXACIN <=0.5 SENSITIVE Sensitive     ERYTHROMYCIN RESISTANT Resistant     GENTAMICIN <=0.5 SENSITIVE Sensitive     OXACILLIN 0.5 SENSITIVE Sensitive     TETRACYCLINE <=1 SENSITIVE Sensitive     VANCOMYCIN <=0.5 SENSITIVE Sensitive     TRIMETH/SULFA <=10 SENSITIVE Sensitive     CLINDAMYCIN RESISTANT Resistant     RIFAMPIN <=0.5 SENSITIVE Sensitive     Inducible Clindamycin POSITIVE Resistant     * MODERATE STAPHYLOCOCCUS AUREUS  MRSA PCR Screening     Status: None   Collection Time: 09/18/20  4:50 AM   Specimen: Nasopharyngeal  Result Value Ref Range Status   MRSA by PCR NEGATIVE NEGATIVE Final    Comment:        The GeneXpert MRSA Assay (FDA approved for NASAL specimens only), is one component of a comprehensive MRSA colonization surveillance program. It is not intended to diagnose MRSA infection nor to guide or monitor treatment for MRSA infections. Performed at [facility], [address] [place], [place] [address]           IMAGING    No results found.   Nutrition  Status: Nutrition Problem: Increased nutrient needs Etiology: catabolic illness (IONGE-95) Signs/Symptoms: estimated needs Interventions: Refer to RD note for recommendations           Central Line/ continued, requirement due to  Reason to continue [place] of central venous pressure or other hemodynamic parameters and poor IV access   Ventilator continued,  requirement due to severe respiratory failure   Ventilator Sedation RASS 0 to -2      ASSESSMENT AND PLAN SYNOPSIS  30 yo male recent hospitalization for perforated diverticulitis s/p colostomy with reversal and leakage of anastomosis with s/p revision now acutely hypoxemic with COVID19 induced severe ARDS.failure to wean from VENT-self extubation and trial of extubation failed due to Severe encephalopathy leading to inability to protect airway. Tracheostomy performed 1/25.PEG placed 1/26 Developed high fevers with septic shock in last 2 days on pressors   Severe ACUTE Hypoxic and Hypercapnic Respiratory Failure -continue Full MV support -continue Bronchodilator Therapy -Wean Fio2 and PEEP as tolerated -will perform SAT/SBT when respiratory parameters are met -VAP/VENT bundle implementation  ACUTE DAISTOLIC CARDIAC FAILURE- EF -oxygen as needed -Lasix as tolerated   Morbid obesity, possible OSA.   Will certainly impact respiratory mechanics, ventilator weaning Suspect will need to consider additional PEEP    NEUROLOGY More alert and awake  SHOCK-SEPSIS -use vasopressors to keep MAP>65 as needed -follow ABG and LA -follow up cultures -emperic ABX  CARDIAC ICU monitoring  ID-FUO -continue IV abx as prescibed -follow up cultures CT abd pelvis, CT chest and CT spine and [place] LE PENDING   GI GI PROPHYLAXIS as indicated  DIET-->TF's as tolerated Constipation protocol as indicated  ENDO - will use ICU hypoglycemic\Hyperglycemia protocol if indicated     ELECTROLYTES -follow labs as  needed -replace as needed -pharmacy consultation and following   DVT/GI PRX ordered and assessed TRANSFUSIONS AS NEEDED MONITOR FSBS I Assessed the need for Labs I Assessed the need for Foley I Assessed the need for Central Venous Line Family Discussion when available I Assessed the need for Mobilization I made an Assessment of medications to be adjusted accordingly Safety Risk assessment completed   CASE DISCUSSED IN MULTIDISCIPLINARY ROUNDS WITH ICU TEAM  Critical Care Time devoted to patient care services described in this note is 55 minutes.   Overall, patient is critically ill, prognosis is guarded.  Patient with Multiorgan failure and at high risk for cardiac arrest and death.    Corrin Parker, M.D.  [facility]  Medical Director [place] Director [facility] Cardio-Pulmonary Department

## 2020-09-22 NOTE — Progress Notes (Signed)
[facility] for Electrolyte Monitoring and Replacement   Recent Labs: Potassium (mmol/L)  Date Value  09/22/2020 4.0   Magnesium (mg/dL)  Date Value  09/22/2020 1.7   Calcium (mg/dL)  Date Value  09/22/2020 8.0 (L)   Albumin (g/dL)  Date Value  09/22/2020 1.8 (L)   Phosphorus (mg/dL)  Date Value  09/22/2020 4.7 (H)   Sodium (mmol/L)  Date Value  09/22/2020 139    Assessment: 30 yo male admitted with perforated diverticulitis and severe acute hypoxemic respiratory failure secondary to COVID-19 infection. Patient has severe ARDs and was reintubated 1/21. Pharmacy has been consulted for electrolyte monitoring and replacement.   Patient is status post tracheostomy 1/25 and PEG 1/26. Nutrition provided via tube feeds. Patient is receiving intermittent diuresis.   Goal of Therapy:  Electrolytes WNL   Plan:  Magnesium 2 g IV.  Pharmacy will continue to follow and replace electrolytes as needed.   Tawnya Crook, PharmD 09/22/2020 12:01 PM

## 2020-09-22 NOTE — Progress Notes (Signed)
RT assisted with patient transport to CT while on the Trilogy ventilator with no complications.

## 2020-09-22 NOTE — Progress Notes (Signed)
Date of Admission:  08/09/2020    ID: Craig [name] is a 30 y.o.  Principal Problem:   Acute hypoxemic respiratory failure due to COVID-19 [facility]) Active Problems:   Severe sepsis (HCC)   AKI (acute kidney injury) ([place])   Hypertension   Fever   Metabolic encephalopathy   Staphylococcus aureus bacteremia   Pressure injury of skin   Antibiotic history Ceftriaxone 12/21>>12/26 Azithromycin 12/21>>12/23 Zosyn 12/27>>12/31 Vanco 12/27-1 dose Linezolid 12/28>>12/31 08/20/2020>>09/05/20 no antibiotics 1/18 one dose of cefepime 1/18 cefazolin>>  Micro 12/21 BC-NG 12/21 UC-NG 12/27 BC NG 1/18 BC- MSSA 1/18 Resp culture MSSA 09/16/20- BC-NG 09/16/20 Resp culture-MSSA Tracheal stoma - MSSA  LDA 08/13/20>>09/13/20- ETT Tracheostomy 09/10/20 08/27/20>>1/19/22PICC-rt 08/25/20 Foley PICC 09/13/20 PEG 09/14/20 Colostomy  Subjective: Patient is more awake today. Frustrated Wife at bedside Continues to have fever Wife says she he gets agitated because of the frustration of not able to walk.  Medications:  . sodium chloride   Intravenous Once  . allopurinol  300 mg Per Tube Daily  . alteplase  2 mg Intracatheter Once  . carvedilol  25 mg Per Tube BID  . chlorhexidine gluconate (MEDLINE KIT)  15 mL Mouth Rinse BID  . Chlorhexidine Gluconate Cloth  6 each Topical Daily  . clonazePAM  1 mg Per Tube BID  . clotrimazole-betamethasone   Topical BID  . doxazosin  1 mg Per Tube Daily  . feeding supplement (PROSource TF)  45 mL Per Tube TID  . fentaNYL  1 patch Transdermal Q72H  . hydrocortisone sod succinate (SOLU-CORTEF) inj  50 mg Intravenous Q6H  . insulin aspart  0-15 Units Subcutaneous Q4H  . insulin aspart  3 Units Subcutaneous Q4H  . mouth rinse  15 mL Mouth Rinse 10 times per day  . midodrine  5 mg Per Tube TID WC  . oxyCODONE  10 mg Per Tube Q6H  . pantoprazole (PROTONIX) IV  40 mg Intravenous Q12H  . sodium chloride flush  10-40 mL Intracatheter Q12H  .  vecuronium  10 mg Intravenous Once    Objective: Vital signs in last 24 hours: Temp:  [99.1 F (37.3 C)-101.9 F (38.8 C)] 99.8 F (37.7 C) (02/03 1657) Pulse Rate:  [107-142] 107 (02/03 1657) Resp:  [17-54] 24 (02/03 1657) BP: (78-134)/(32-84) 131/71 (02/03 1657) SpO2:  [88 %-100 %] 96 % (02/03 1657) FiO2 (%):  [40 %] 40 % (02/03 1515) Weight:  [120.3 kg] 120.3 kg (02/03 0454)  PHYSICAL EXAM:  General: Awake, more cooperative today follow simple commands Head: Normocephalic, without obvious abnormality, atraumatic. Eyes: Conjunctivae clear, anicteric sclerae. Pupils are equal ENT Nares normal. No drainage or sinus tenderness. Lips, mucosa, and tongue normal. No Thrush Neck: Supple, symmetrical, no adenopathy, thyroid: non tender no carotid bruit and no JVD. Back: stage II intergluteal wound Lungs: S1-S2 Crepitations in the bases  Heart sounds tachycardia Abdomen: Soft, PEG and colostomy Extremities: Edema from hip to the ankle bilaterally Skin: No rashes or lesions. Or bruising Lymph: Cervical, supraclavicular normal. Neurologic: Cannot assess  Lab Results Recent Labs    09/21/20 0439 09/22/20 0535 09/22/20 1804  WBC 7.3 8.0  --   HGB 7.4* 6.6* 8.1*  HCT 24.5* 22.6* 27.0*  NA 138 139  --   K 4.1 4.0  --   CL 96* 97*  --   CO2 32 33*  --   BUN 6 5*  --   CREATININE <0.30* <0.30*  --    Liver Panel Recent Labs  09/21/20 0439 09/22/20 0535  ALBUMIN 1.8* 1.8*   Sedimentation Rate No results for input(s): ESRSEDRATE in the last 72 hours. C-Reactive Protein No results for input(s): CRP in the last 72 hours.  Microbiology:  Studies/Results: CT ABDOMEN PELVIS WO CONTRAST  Result Date: 09/22/2020 CLINICAL DATA:  History of perforated diverticulitis and COVID induced ARDS. Evaluate for possible retroperitoneal hematoma. EXAM: CT CHEST, ABDOMEN AND PELVIS WITHOUT CONTRAST TECHNIQUE: Multidetector CT imaging of the chest, abdomen and pelvis was performed  following the standard protocol without IV contrast. COMPARISON:  CT scan 09/13/2020 FINDINGS: CT CHEST FINDINGS Cardiovascular: The tracheostomy tube is in good position without complicating features. Heart is mildly enlarged but stable. No pericardial effusion. The aorta is normal in caliber. Mediastinum/Nodes: Scattered mediastinal and hilar lymph nodes likely reactive given the lung findings. The esophagus is grossly normal. Lungs/Pleura: Extensive bilateral airspace process in the lungs suggesting infection/pneumonia. Small bilateral pleural effusions. No pneumothorax. Some debris is noted in the right mainstem bronchus. Musculoskeletal: No significant bony findings. CT ABDOMEN PELVIS FINDINGS Hepatobiliary: No hepatic lesions are identified without contrast. The hepatic contour is irregular and the caudate lobe is enlarged suggesting cirrhosis. Gallbladder is contracted. No common bile duct dilatation. Pancreas: No mass, inflammation or ductal dilatation. Spleen: Splenomegaly. Spleen measures 15 x 14 x 10 cm. No lesions are identified. Adrenals/Urinary Tract: Adrenal glands and kidneys are unremarkable. The bladder is grossly normal. There is some gas in the bladder likely from recent catheterization. Stomach/Bowel: The stomach contains a feeding gastrostomy tube. No complicating features are identified. Duodenum and small bowel are grossly normal. There are surgical changes involving the distal ileum along with a left lower quadrant colostomy and a Hartmann's pouch. Suspect moderate inflammatory process involving the right colon with colonic wall thickening and pericolonic interstitial changes. I do not see a definite intra-abdominal/intrapelvic abscess or free air. Vascular/Lymphatic: The aorta and branch vessels are grossly normal. No mesenteric or retroperitoneal mass or adenopathy. Scattered lymph nodes are likely reactive. Reproductive: The prostate gland and seminal vesicles are unremarkable. Other: No  free abdominal or free pelvic fluid collections to suggest an abscess. No pelvic adenopathy. Moderate subcutaneous edema and skin thickening and both hip and thigh regions possibly reflecting cellulitis. Musculoskeletal: No acute bony findings. Chronic changes of bilateral hip AVN. IMPRESSION: 1. Extensive bilateral airspace process suggesting infection/pneumonia. 2. Small bilateral pleural effusions. 3. Tracheostomy tube and feeding gastrostomy tube in good position without complicating features. 4. Suspect moderate inflammatory process involving the right colon with colonic wall thickening and pericolonic interstitial changes. No definite intra-abdominal/intrapelvic abscess or free air. 5. 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 6. Moderate subcutaneous edema and skin thickening and both hip and thigh regions possibly reflecting cellulitis. 7. Changes of chronic bilateral hip AVN. Electronically Signed   By: Marijo Sanes M.D.   On: 09/22/2020 13:23   CT CHEST WO CONTRAST  Result Date: 09/22/2020 CLINICAL DATA:  History of perforated diverticulitis and COVID induced ARDS. Evaluate for possible retroperitoneal hematoma. EXAM: CT CHEST, ABDOMEN AND PELVIS WITHOUT CONTRAST TECHNIQUE: Multidetector CT imaging of the chest, abdomen and pelvis was performed following the standard protocol without IV contrast. COMPARISON:  CT scan 09/13/2020 FINDINGS: CT CHEST FINDINGS Cardiovascular: The tracheostomy tube is in good position without complicating features. Heart is mildly enlarged but stable. No pericardial effusion. The aorta is normal in caliber. Mediastinum/Nodes: Scattered mediastinal and hilar lymph nodes likely reactive given the lung findings. The esophagus is grossly normal. Lungs/Pleura: Extensive bilateral airspace process in the lungs suggesting  infection/pneumonia. Small bilateral pleural effusions. No pneumothorax. Some debris is noted in the right mainstem bronchus. Musculoskeletal: No significant  bony findings. CT ABDOMEN PELVIS FINDINGS Hepatobiliary: No hepatic lesions are identified without contrast. The hepatic contour is irregular and the caudate lobe is enlarged suggesting cirrhosis. Gallbladder is contracted. No common bile duct dilatation. Pancreas: No mass, inflammation or ductal dilatation. Spleen: Splenomegaly. Spleen measures 15 x 14 x 10 cm. No lesions are identified. Adrenals/Urinary Tract: Adrenal glands and kidneys are unremarkable. The bladder is grossly normal. There is some gas in the bladder likely from recent catheterization. Stomach/Bowel: The stomach contains a feeding gastrostomy tube. No complicating features are identified. Duodenum and small bowel are grossly normal. There are surgical changes involving the distal ileum along with a left lower quadrant colostomy and a Hartmann's pouch. Suspect moderate inflammatory process involving the right colon with colonic wall thickening and pericolonic interstitial changes. I do not see a definite intra-abdominal/intrapelvic abscess or free air. Vascular/Lymphatic: The aorta and branch vessels are grossly normal. No mesenteric or retroperitoneal mass or adenopathy. Scattered lymph nodes are likely reactive. Reproductive: The prostate gland and seminal vesicles are unremarkable. Other: No free abdominal or free pelvic fluid collections to suggest an abscess. No pelvic adenopathy. Moderate subcutaneous edema and skin thickening and both hip and thigh regions possibly reflecting cellulitis. Musculoskeletal: No acute bony findings. Chronic changes of bilateral hip AVN. IMPRESSION: 1. Extensive bilateral airspace process suggesting infection/pneumonia. 2. Small bilateral pleural effusions. 3. Tracheostomy tube and feeding gastrostomy tube in good position without complicating features. 4. Suspect moderate inflammatory process involving the right colon with colonic wall thickening and pericolonic interstitial changes. No definite  intra-abdominal/intrapelvic abscess or free air. 5. 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 6. Moderate subcutaneous edema and skin thickening and both hip and thigh regions possibly reflecting cellulitis. 7. Changes of chronic bilateral hip AVN. Electronically Signed   By: Marijo Sanes M.D.   On: 09/22/2020 13:23   CT CERVICAL SPINE WO CONTRAST  Result Date: 09/22/2020 CLINICAL DATA:  Epidural abscess. EXAM: CT CERVICAL SPINE WITHOUT CONTRAST TECHNIQUE: Multidetector CT imaging of the cervical spine was performed without intravenous contrast. Multiplanar CT image reconstructions were also generated. COMPARISON:  None. FINDINGS: Alignment: Normal Skull base and vertebrae: No acute fracture. No primary bone lesion or focal pathologic process. Soft tissues and spinal canal: No prevertebral fluid or swelling. No visible canal hematoma. Disc levels: Very generous spinal canal. No large disc protrusions, spinal or foraminal stenosis. No findings suspicious for epidural hematoma or abscess. Upper chest: Significant bilateral lung infiltrates. Other: Tracheostomy tube in good position. IMPRESSION: 1. Normal alignment and no acute bony findings. 2. Very generous spinal canal. No findings suspicious for epidural hematoma or abscess. 3. Significant bilateral lung infiltrates. Electronically Signed   By: Marijo Sanes M.D.   On: 09/22/2020 13:25   US Venous Img Lower Bilateral (DVT)  Result Date: 09/22/2020 CLINICAL DATA:  Swelling EXAM: BILATERAL LOWER EXTREMITY VENOUS DOPPLER ULTRASOUND TECHNIQUE: Gray-scale sonography with compression, as well as color and duplex ultrasound, were performed to evaluate the deep venous system(s) from the level of the common femoral vein through the popliteal and proximal calf veins. COMPARISON:  09/08/2020 FINDINGS: VENOUS Normal compressibility of the common femoral, superficial femoral, and popliteal veins, as well as the visualized calf veins. Visualized portions of profunda  femoral vein and great saphenous vein unremarkable. No filling defects to suggest DVT on grayscale or color Doppler imaging. Doppler waveforms show normal direction of venous flow, normal respiratory  plasticity and response to augmentation. OTHER None. Limitations: Limited visualization of the right peroneal veins IMPRESSION: No lower extremity DVT Electronically Signed   By: Miachel Roux M.D.   On: 09/22/2020 14:06   CT T-SPINE NO CHARGE  Result Date: 09/22/2020 CLINICAL DATA:  Evaluate for epidural abscess. EXAM: CT THORACIC SPINE WITHOUT CONTRAST TECHNIQUE: Multidetector CT images of the thoracic were obtained using the standard protocol without intravenous contrast. COMPARISON:  None. FINDINGS: Alignment: Normal Vertebrae: No destructive bony changes to suggest discitis or osteomyelitis. Paraspinal and other soft tissues: No significant findings. Disc levels: No large disc protrusions, spinal or foraminal stenosis. No findings suspicious for epidural hematoma or abscess. IMPRESSION: Unremarkable CT examination of the thoracic spine. No findings suspicious for discitis or osteomyelitis. Electronically Signed   By: Marijo Sanes M.D.   On: 09/22/2020 13:12   CT L-SPINE NO CHARGE  Result Date: 09/22/2020 CLINICAL DATA:  History of perforated diverticulitis status post colostomy acute hypoxemia with COVID-19 induced ARDS. Evaluate for possible epidural abscess. EXAM: CT LUMBAR SPINE WITHOUT CONTRAST TECHNIQUE: Multidetector CT imaging of the lumbar spine was performed without intravenous contrast administration. Multiplanar CT image reconstructions were also generated. COMPARISON:  None. FINDINGS: Examination is limited by body habitus and some motion artifact. Segmentation: There are five lumbar type vertebral bodies. The last full intervertebral disc space is labeled L5-S1. Alignment: Normal Vertebrae: Grossly normal. No findings suspicious for discitis or osteomyelitis. Paraspinal and other soft tissues: No  significant paraspinal or retroperitoneal findings. Disc levels: No large disc protrusions, spinal or foraminal stenosis. No obvious epidural hematoma or abscess. IMPRESSION: 1. Limited examination due to body habitus and some motion artifact. 2. No findings suspicious for discitis, osteomyelitis or epidural abscess. 3. No large disc protrusions, spinal or foraminal stenosis. Electronically Signed   By: Marijo Sanes M.D.   On: 09/22/2020 13:11     Assessment/Plan:  Acute hypoxic respiratory failure due to Covid on the vent.  Has tracheostomy  Secondary bacterial pneumonia is due to staph aureus has been on appropriate antibiotics all along.  Fever new onset since more than a week. Differential diagnosis infection versus noninfectious cause Blood culture from 09/16/2020 was negative Sputum culture was MSSA Tracheostomy stoma site was also MSSA Urine analysis from 09/22/2020 clean no leukoesterase or nitrites Drug fever like due to sodium valproate should be considered Also linezolid and fentanyl need to be considered for serotonin syndrome.  We will discontinue linezolid    MSSA bacteremia and MSSA multifocal pneumonia has been on antibiotics since long time.  Anemia next Hypoalbuminemia  Discussed the management with the patient's wife and care team.

## 2020-09-23 DIAGNOSIS — J9601 Acute respiratory failure with hypoxia: Secondary | ICD-10-CM | POA: Diagnosis not present

## 2020-09-23 DIAGNOSIS — R7881 Bacteremia: Secondary | ICD-10-CM | POA: Diagnosis not present

## 2020-09-23 DIAGNOSIS — U071 COVID-19: Secondary | ICD-10-CM | POA: Diagnosis not present

## 2020-09-23 DIAGNOSIS — R509 Fever, unspecified: Secondary | ICD-10-CM | POA: Diagnosis not present

## 2020-09-23 LAB — GLUCOSE, CAPILLARY
Glucose-Capillary: 103 mg/dL — ABNORMAL HIGH (ref 70–99)
Glucose-Capillary: 107 mg/dL — ABNORMAL HIGH (ref 70–99)
Glucose-Capillary: 155 mg/dL — ABNORMAL HIGH (ref 70–99)
Glucose-Capillary: 156 mg/dL — ABNORMAL HIGH (ref 70–99)
Glucose-Capillary: 158 mg/dL — ABNORMAL HIGH (ref 70–99)
Glucose-Capillary: 93 mg/dL (ref 70–99)

## 2020-09-23 LAB — RENAL FUNCTION PANEL
Albumin: 1.9 g/dL — ABNORMAL LOW (ref 3.5–5.0)
Anion gap: 11 (ref 5–15)
BUN: 7 mg/dL (ref 6–20)
CO2: 33 mmol/L — ABNORMAL HIGH (ref 22–32)
Calcium: 8.2 mg/dL — ABNORMAL LOW (ref 8.9–10.3)
Chloride: 96 mmol/L — ABNORMAL LOW (ref 98–111)
Creatinine, Ser: <0.3 mg/dL — ABNORMAL LOW (ref 0.61–1.24)
Glucose, Bld: 188 mg/dL — ABNORMAL HIGH (ref 70–99)
Phosphorus: 4.3 mg/dL (ref 2.5–4.6)
Potassium: 4 mmol/L (ref 3.5–5.1)
Sodium: 140 mmol/L (ref 135–145)

## 2020-09-23 LAB — CBC WITH DIFFERENTIAL/PLATELET
Abs Immature Granulocytes: 0.82 10*3/uL — ABNORMAL HIGH (ref 0.00–0.07)
Basophils Absolute: 0 10*3/uL (ref 0.0–0.1)
Basophils Relative: 0 %
Eosinophils Absolute: 0 10*3/uL (ref 0.0–0.5)
Eosinophils Relative: 0 %
HCT: 26.2 % — ABNORMAL LOW (ref 39.0–52.0)
Hemoglobin: 7.8 g/dL — ABNORMAL LOW (ref 13.0–17.0)
Immature Granulocytes: 9 %
Lymphocytes Relative: 12 %
Lymphs Abs: 1.1 10*3/uL (ref 0.7–4.0)
MCH: 27.4 pg (ref 26.0–34.0)
MCHC: 29.8 g/dL — ABNORMAL LOW (ref 30.0–36.0)
MCV: 91.9 fL (ref 80.0–100.0)
Monocytes Absolute: 1 10*3/uL (ref 0.1–1.0)
Monocytes Relative: 10 %
Neutro Abs: 6.8 10*3/uL (ref 1.7–7.7)
Neutrophils Relative %: 69 %
Platelets: 315 10*3/uL (ref 150–400)
RBC: 2.85 MIL/uL — ABNORMAL LOW (ref 4.22–5.81)
RDW: 16.9 % — ABNORMAL HIGH (ref 11.5–15.5)
Smear Review: NORMAL
WBC: 9.7 10*3/uL (ref 4.0–10.5)
nRBC: 0.5 % — ABNORMAL HIGH (ref 0.0–0.2)

## 2020-09-23 LAB — BPAM RBC
Blood Product Expiration Date: 202203042359
ISSUE DATE / TIME: 202202031457
Unit Type and Rh: 5100

## 2020-09-23 LAB — OCCULT BLOOD X 1 CARD TO LAB, STOOL: Fecal Occult Bld: NEGATIVE

## 2020-09-23 LAB — TYPE AND SCREEN
ABO/RH(D): O POS
Antibody Screen: NEGATIVE
Unit division: 0

## 2020-09-23 LAB — MAGNESIUM: Magnesium: 1.8 mg/dL (ref 1.7–2.4)

## 2020-09-23 MED ORDER — ALBUMIN HUMAN 25 % IV SOLN
12.5000 g | Freq: Once | INTRAVENOUS | Status: AC
  Administered 2020-09-23: 12.5 g via INTRAVENOUS
  Filled 2020-09-23: qty 50

## 2020-09-23 MED ORDER — FENTANYL 2500MCG IN NS 250ML (10MCG/ML) PREMIX INFUSION: 0.0000 ug/h | INTRAVENOUS | Status: DC

## 2020-09-23 MED ORDER — FUROSEMIDE 10 MG/ML IJ SOLN
40.0000 mg | Freq: Every day | INTRAMUSCULAR | Status: DC
  Administered 2020-09-23 – 2020-09-24 (×2): 40 mg via INTRAVENOUS
  Filled 2020-09-23 (×2): qty 4

## 2020-09-23 MED ORDER — FENTANYL 2500MCG IN NS 250ML (10MCG/ML) PREMIX INFUSION
INTRAVENOUS | Status: AC
  Administered 2020-09-23: 250 ug/h via INTRAVENOUS
  Filled 2020-09-23: qty 250

## 2020-09-23 MED ORDER — PROPOFOL 1000 MG/100ML IV EMUL: 5.0000 ug/kg/min | INTRAVENOUS | Status: DC

## 2020-09-23 MED ORDER — PROPOFOL 1000 MG/100ML IV EMUL
INTRAVENOUS | Status: AC
  Administered 2020-09-23: 40 ug/kg/min via INTRAVENOUS
  Filled 2020-09-23: qty 100

## 2020-09-23 MED ORDER — FENTANYL BOLUS VIA INFUSION
100.0000 ug | INTRAVENOUS | Status: DC | PRN
  Filled 2020-09-23: qty 100

## 2020-09-23 MED ORDER — HYDROCORTISONE NA SUCCINATE PF 100 MG IJ SOLR
50.0000 mg | Freq: Two times a day (BID) | INTRAMUSCULAR | Status: DC
  Administered 2020-09-23 – 2020-09-24 (×2): 50 mg via INTRAVENOUS
  Filled 2020-09-23 (×2): qty 2

## 2020-09-23 NOTE — Progress Notes (Signed)
Date of Admission:  08/09/2020      ID: Craig [name] is a 30 y.o. male  Principal Problem:   Acute hypoxemic respiratory failure due to COVID-19 [facility]) Active Problems:   Severe sepsis (HCC)   AKI (acute kidney injury) ([place])   Hypertension   Fever   Metabolic encephalopathy   Staphylococcus aureus bacteremia   Pressure injury of skin    Antibiotic history Ceftriaxone 12/21>>12/26 Azithromycin 12/21>>12/23 Zosyn 12/27>>12/31 Vanco 12/27-1 dose Linezolid 12/28>>12/31 08/20/2020>>09/05/20 no antibiotics 1/18 one dose of cefepime 1/18 cefazolin>>  Micro 12/21 BC-NG 12/21 UC-NG 12/27 BC NG 1/18 BC- MSSA 1/18 Resp culture MSSA 09/16/20- BC-NG 09/16/20 Resp culture-MSSA Tracheal stoma - MSSA  LDA 08/13/20>>09/13/20- ETT Tracheostomy 09/10/20 08/27/20>>1/19/22PICC-rt 08/25/20 Foley PICC 09/13/20 PEG 09/14/20 Colostomy   Subjective: Pt had bronch today sedated No history available Wife at bed side Medications:  . sodium chloride   Intravenous Once  . allopurinol  300 mg Per Tube Daily  . alteplase  2 mg Intracatheter Once  . chlorhexidine gluconate (MEDLINE KIT)  15 mL Mouth Rinse BID  . Chlorhexidine Gluconate Cloth  6 each Topical Daily  . clonazePAM  1 mg Per Tube BID  . clotrimazole-betamethasone   Topical BID  . doxazosin  1 mg Per Tube Daily  . feeding supplement (PROSource TF)  45 mL Per Tube TID  . fentaNYL  1 patch Transdermal Q72H  . furosemide  40 mg Intravenous Daily  . hydrocortisone sod succinate (SOLU-CORTEF) inj  50 mg Intravenous Q12H  . insulin aspart  0-15 Units Subcutaneous Q4H  . insulin aspart  3 Units Subcutaneous Q4H  . mouth rinse  15 mL Mouth Rinse 10 times per day  . midodrine  5 mg Per Tube TID WC  . oxyCODONE  10 mg Per Tube Q6H  . pantoprazole (PROTONIX) IV  40 mg Intravenous Q12H  . sodium chloride flush  10-40 mL Intracatheter Q12H  . vecuronium  10 mg Intravenous Once    Objective: Vital signs in last 24  hours: Temp:  [99.2 F (37.3 C)-100.7 F (38.2 C)] 99.2 F (37.3 C) (02/04 1600) Pulse Rate:  [94-136] 109 (02/04 1900) Resp:  [20-33] 30 (02/04 1600) BP: (107-167)/(49-102) 123/54 (02/04 1800) SpO2:  [85 %-97 %] 92 % (02/04 1900) FiO2 (%):  [40 %] 40 % (02/04 1600) Weight:  [116.6 kg] 116.6 kg (02/04 0431)  PHYSICAL EXAM:  General: sedated, tracheostomy, vent Back: : stage 2 wound. Lungs: Clear to auscultation bilaterally. No Wheezing or Rhonchi. No rales. Heart: Regular rate and rhythm, no murmur, rub or gallop. Abdomen: Soft, peg, colostomy Extremities: edema  Skin: No rashes or lesions. Or bruising Neurologic: cannot be assessed  Lab Results Recent Labs    09/22/20 0535 09/22/20 1804 09/23/20 0452  WBC 8.0  --  9.7  HGB 6.6* 8.1* 7.8*  HCT 22.6* 27.0* 26.2*  NA 139  --  140  K 4.0  --  4.0  CL 97*  --  96*  CO2 33*  --  33*  BUN 5*  --  7  CREATININE <0.30*  --  <0.30*   Liver Panel Recent Labs    09/22/20 0535 09/23/20 0452  ALBUMIN 1.8* 1.9*   Sedimentation Rate No results for input(s): ESRSEDRATE in the last 72 hours. C-Reactive Protein No results for input(s): CRP in the last 72 hours.  Microbiology:  Studies/Results: CT ABDOMEN PELVIS WO CONTRAST  Result Date: 09/22/2020 CLINICAL DATA:  History of perforated diverticulitis and COVID induced  ARDS. Evaluate for possible retroperitoneal hematoma. EXAM: CT CHEST, ABDOMEN AND PELVIS WITHOUT CONTRAST TECHNIQUE: Multidetector CT imaging of the chest, abdomen and pelvis was performed following the standard protocol without IV contrast. COMPARISON:  CT scan 09/13/2020 FINDINGS: CT CHEST FINDINGS Cardiovascular: The tracheostomy tube is in good position without complicating features. Heart is mildly enlarged but stable. No pericardial effusion. The aorta is normal in caliber. Mediastinum/Nodes: Scattered mediastinal and hilar lymph nodes likely reactive given the lung findings. The esophagus is grossly normal.  Lungs/Pleura: Extensive bilateral airspace process in the lungs suggesting infection/pneumonia. Small bilateral pleural effusions. No pneumothorax. Some debris is noted in the right mainstem bronchus. Musculoskeletal: No significant bony findings. CT ABDOMEN PELVIS FINDINGS Hepatobiliary: No hepatic lesions are identified without contrast. The hepatic contour is irregular and the caudate lobe is enlarged suggesting cirrhosis. Gallbladder is contracted. No common bile duct dilatation. Pancreas: No mass, inflammation or ductal dilatation. Spleen: Splenomegaly. Spleen measures 15 x 14 x 10 cm. No lesions are identified. Adrenals/Urinary Tract: Adrenal glands and kidneys are unremarkable. The bladder is grossly normal. There is some gas in the bladder likely from recent catheterization. Stomach/Bowel: The stomach contains a feeding gastrostomy tube. No complicating features are identified. Duodenum and small bowel are grossly normal. There are surgical changes involving the distal ileum along with a left lower quadrant colostomy and a Hartmann's pouch. Suspect moderate inflammatory process involving the right colon with colonic wall thickening and pericolonic interstitial changes. I do not see a definite intra-abdominal/intrapelvic abscess or free air. Vascular/Lymphatic: The aorta and branch vessels are grossly normal. No mesenteric or retroperitoneal mass or adenopathy. Scattered lymph nodes are likely reactive. Reproductive: The prostate gland and seminal vesicles are unremarkable. Other: No free abdominal or free pelvic fluid collections to suggest an abscess. No pelvic adenopathy. Moderate subcutaneous edema and skin thickening and both hip and thigh regions possibly reflecting cellulitis. Musculoskeletal: No acute bony findings. Chronic changes of bilateral hip AVN. IMPRESSION: 1. Extensive bilateral airspace process suggesting infection/pneumonia. 2. Small bilateral pleural effusions. 3. Tracheostomy tube and  feeding gastrostomy tube in good position without complicating features. 4. Suspect moderate inflammatory process involving the right colon with colonic wall thickening and pericolonic interstitial changes. No definite intra-abdominal/intrapelvic abscess or free air. 5. 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 6. Moderate subcutaneous edema and skin thickening and both hip and thigh regions possibly reflecting cellulitis. 7. Changes of chronic bilateral hip AVN. Electronically Signed   By: Marijo Sanes M.D.   On: 09/22/2020 13:23   CT CHEST WO CONTRAST  Result Date: 09/22/2020 CLINICAL DATA:  History of perforated diverticulitis and COVID induced ARDS. Evaluate for possible retroperitoneal hematoma. EXAM: CT CHEST, ABDOMEN AND PELVIS WITHOUT CONTRAST TECHNIQUE: Multidetector CT imaging of the chest, abdomen and pelvis was performed following the standard protocol without IV contrast. COMPARISON:  CT scan 09/13/2020 FINDINGS: CT CHEST FINDINGS Cardiovascular: The tracheostomy tube is in good position without complicating features. Heart is mildly enlarged but stable. No pericardial effusion. The aorta is normal in caliber. Mediastinum/Nodes: Scattered mediastinal and hilar lymph nodes likely reactive given the lung findings. The esophagus is grossly normal. Lungs/Pleura: Extensive bilateral airspace process in the lungs suggesting infection/pneumonia. Small bilateral pleural effusions. No pneumothorax. Some debris is noted in the right mainstem bronchus. Musculoskeletal: No significant bony findings. CT ABDOMEN PELVIS FINDINGS Hepatobiliary: No hepatic lesions are identified without contrast. The hepatic contour is irregular and the caudate lobe is enlarged suggesting cirrhosis. Gallbladder is contracted. No common bile duct dilatation. Pancreas: No mass,  inflammation or ductal dilatation. Spleen: Splenomegaly. Spleen measures 15 x 14 x 10 cm. No lesions are identified. Adrenals/Urinary Tract: Adrenal  glands and kidneys are unremarkable. The bladder is grossly normal. There is some gas in the bladder likely from recent catheterization. Stomach/Bowel: The stomach contains a feeding gastrostomy tube. No complicating features are identified. Duodenum and small bowel are grossly normal. There are surgical changes involving the distal ileum along with a left lower quadrant colostomy and a Hartmann's pouch. Suspect moderate inflammatory process involving the right colon with colonic wall thickening and pericolonic interstitial changes. I do not see a definite intra-abdominal/intrapelvic abscess or free air. Vascular/Lymphatic: The aorta and branch vessels are grossly normal. No mesenteric or retroperitoneal mass or adenopathy. Scattered lymph nodes are likely reactive. Reproductive: The prostate gland and seminal vesicles are unremarkable. Other: No free abdominal or free pelvic fluid collections to suggest an abscess. No pelvic adenopathy. Moderate subcutaneous edema and skin thickening and both hip and thigh regions possibly reflecting cellulitis. Musculoskeletal: No acute bony findings. Chronic changes of bilateral hip AVN. IMPRESSION: 1. Extensive bilateral airspace process suggesting infection/pneumonia. 2. Small bilateral pleural effusions. 3. Tracheostomy tube and feeding gastrostomy tube in good position without complicating features. 4. Suspect moderate inflammatory process involving the right colon with colonic wall thickening and pericolonic interstitial changes. No definite intra-abdominal/intrapelvic abscess or free air. 5. 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 6. Moderate subcutaneous edema and skin thickening and both hip and thigh regions possibly reflecting cellulitis. 7. Changes of chronic bilateral hip AVN. Electronically Signed   By: Marijo Sanes M.D.   On: 09/22/2020 13:23   CT CERVICAL SPINE WO CONTRAST  Result Date: 09/22/2020 CLINICAL DATA:  Epidural abscess. EXAM: CT CERVICAL SPINE  WITHOUT CONTRAST TECHNIQUE: Multidetector CT imaging of the cervical spine was performed without intravenous contrast. Multiplanar CT image reconstructions were also generated. COMPARISON:  None. FINDINGS: Alignment: Normal Skull base and vertebrae: No acute fracture. No primary bone lesion or focal pathologic process. Soft tissues and spinal canal: No prevertebral fluid or swelling. No visible canal hematoma. Disc levels: Very generous spinal canal. No large disc protrusions, spinal or foraminal stenosis. No findings suspicious for epidural hematoma or abscess. Upper chest: Significant bilateral lung infiltrates. Other: Tracheostomy tube in good position. IMPRESSION: 1. Normal alignment and no acute bony findings. 2. Very generous spinal canal. No findings suspicious for epidural hematoma or abscess. 3. Significant bilateral lung infiltrates. Electronically Signed   By: Marijo Sanes M.D.   On: 09/22/2020 13:25   US Venous Img Lower Bilateral (DVT)  Result Date: 09/22/2020 CLINICAL DATA:  Swelling EXAM: BILATERAL LOWER EXTREMITY VENOUS DOPPLER ULTRASOUND TECHNIQUE: Gray-scale sonography with compression, as well as color and duplex ultrasound, were performed to evaluate the deep venous system(s) from the level of the common femoral vein through the popliteal and proximal calf veins. COMPARISON:  09/08/2020 FINDINGS: VENOUS Normal compressibility of the common femoral, superficial femoral, and popliteal veins, as well as the visualized calf veins. Visualized portions of profunda femoral vein and great saphenous vein unremarkable. No filling defects to suggest DVT on grayscale or color Doppler imaging. Doppler waveforms show normal direction of venous flow, normal respiratory plasticity and response to augmentation. OTHER None. Limitations: Limited visualization of the right peroneal veins IMPRESSION: No lower extremity DVT Electronically Signed   By: Miachel Roux M.D.   On: 09/22/2020 14:06   CT T-SPINE NO  CHARGE  Result Date: 09/22/2020 CLINICAL DATA:  Evaluate for epidural abscess. EXAM: CT THORACIC SPINE WITHOUT CONTRAST  TECHNIQUE: Multidetector CT images of the thoracic were obtained using the standard protocol without intravenous contrast. COMPARISON:  None. FINDINGS: Alignment: Normal Vertebrae: No destructive bony changes to suggest discitis or osteomyelitis. Paraspinal and other soft tissues: No significant findings. Disc levels: No large disc protrusions, spinal or foraminal stenosis. No findings suspicious for epidural hematoma or abscess. IMPRESSION: Unremarkable CT examination of the thoracic spine. No findings suspicious for discitis or osteomyelitis. Electronically Signed   By: Marijo Sanes M.D.   On: 09/22/2020 13:12   CT L-SPINE NO CHARGE  Result Date: 09/22/2020 CLINICAL DATA:  History of perforated diverticulitis status post colostomy acute hypoxemia with COVID-19 induced ARDS. Evaluate for possible epidural abscess. EXAM: CT LUMBAR SPINE WITHOUT CONTRAST TECHNIQUE: Multidetector CT imaging of the lumbar spine was performed without intravenous contrast administration. Multiplanar CT image reconstructions were also generated. COMPARISON:  None. FINDINGS: Examination is limited by body habitus and some motion artifact. Segmentation: There are five lumbar type vertebral bodies. The last full intervertebral disc space is labeled L5-S1. Alignment: Normal Vertebrae: Grossly normal. No findings suspicious for discitis or osteomyelitis. Paraspinal and other soft tissues: No significant paraspinal or retroperitoneal findings. Disc levels: No large disc protrusions, spinal or foraminal stenosis. No obvious epidural hematoma or abscess. IMPRESSION: 1. Limited examination due to body habitus and some motion artifact. 2. No findings suspicious for discitis, osteomyelitis or epidural abscess. 3. No large disc protrusions, spinal or foraminal stenosis. Electronically Signed   By: Marijo Sanes M.D.   On:  09/22/2020 13:11     Assessment/Plan:  Acute hypoxic respiratory failure due to Covid on the vent.  Has a tracheostomy.    Secondary bacterial pneumonia due to staph aureus and has been on appropriate antibiotics all along.    New onset fever more than a week now.  Differential diagnosis includes infection versus noninfectious cause.  Blood culture from 09/16/2020 was negative.  Sputum culture was MSSA.  Tracheostomy stoma site was also MSSA.  Urine analysis from 09/22/2020 clean with no leukoesterase or nitrates.  Drug fever was considered and sodium valproate has been stopped.  Also linezolid fentanyl interaction serotonin syndrome was also considered and it has been stopped.  MSSA bacteremia and MSSA multifocal pneumonia has been on antibiotics.  TEE attempted but could not be done because of the tracheostomy  Anemia  Hypoalbuminemia  Discussed the management with his wife at bedside ID will follow him peripherally this weekend call if needed.

## 2020-09-23 NOTE — TOC Progression Note (Signed)
Transition of Care [facility]) - Progression Note    Patient Details  Name: Craig [name] MRN: [id number] Date of Birth: 1991-04-10  Transition of Care [facility]) CM/SW [place], [place] Phone Number: [phone] 09/23/2020, 4:03 PM  Clinical Narrative:     CSW spoke with Dr. Lanney Gins to confirm patient d/c for [facility] tomorrow 2/5, if patient is stable for transfer. Wells Guiles RN at [facility] confirmed bed offer - Room #327, MD Report to Dr. Conley Rolls Sie [phone] or [phone], RN report to [phone] or [phone].  D/C pending confirmation from ICU Attending that patient is ready.       Expected Discharge Plan and Services                                                 Social Determinants of Health (SDOH) Interventions    Readmission Risk Interventions No flowsheet data found.

## 2020-09-23 NOTE — Progress Notes (Signed)
Pt placed back on resting settings due to increased WOB

## 2020-09-23 NOTE — Progress Notes (Signed)
PT Cancellation Note  Patient Details Name: Craig [name] MRN: [id number] DOB: [birth year]   Cancelled Treatment:    Reason Eval/Treat Not Completed: Patient not medically ready.  PT consult received.  Chart reviewed.  Pt noted to be resting in bed with HR elevated to 130-132 bpm.  D/t elevated HR at rest, will hold PT at this time and re-attempt PT evaluation at a later date/time as medically appropriate.  Leitha Bleak, PT 09/23/20, 8:57 AM

## 2020-09-23 NOTE — Procedures (Signed)
PROCEDURE: BRONCHOSCOPY Therapeutic Aspiration of Tracheobronchial Tree and Bronchoalveolar lavage  PROCEDURE DATE: 09/23/2020  TIME:  NAME:  Craig [name]  DOB:[birth year]  MRN: [id number] LOC:  IC05A/IC05A-AA    HOSP DAY: _0 @ CODE STATUS:      Code Status Orders  (From admission, onward)         Start     Ordered   08/09/20 2333  Full code  Continuous        08/09/20 2339        Code Status History    Date Active Date Inactive Code Status Order ID Comments User Context   07/08/2020 1158 07/08/2020 1830 Full Code [id number]  Herbert Pun, MD ED   06/28/2020 1704 07/03/2020 1942 Full Code [id number]  Herbert Pun, MD Inpatient   11/22/2019 1425 11/22/2019 2202 Full Code [id number]  Herbert Pun, MD ED   Advance Care Planning Activity          Indications/Preliminary Diagnosis:   Consent: (Place X beside choice/s below)  The benefits, risks and possible complications of the procedure were        explained to:  ___ patient  __x_ patient's family-Wife [name]  ___ other:___________  who verbalized understanding and gave:  ___ verbal  ___ written  ___ verbal and written  ___ telephone  ___ other:________ consent.      Unable to obtain consent; procedure performed on emergent basis.     Other:       PRESEDATION ASSESSMENT: History and Physical has been performed. Patient meds and allergies have been reviewed. Presedation airway examination has been performed and documented. Baseline vital signs, sedation score, oxygenation status, and cardiac rhythm were reviewed. Patient was deemed to be in satisfactory condition to undergo the procedure.    PREMEDICATIONS:   Sedative/Narcotic Amt Dose   Versed  mg   Fentanyl 100 mcg  Diprivan 20 mg            PROCEDURE DETAILS: Timeout performed and correct patient, name, & ID confirmed. Following prep per Pulmonary policy, appropriate sedation was administered. The Bronchoscope was  inserted in to oral cavity with bite block in place. Therapeutic aspiration of Tracheobronchial tree was performed.  Airway exam proceeded with findings, technical procedures, and specimen collection as noted below. At the end of exam the scope was withdrawn without incident. Impression and Plan as noted below.           Airway Prep (Place X beside choice below)   1% Transtracheal Lidocaine Anesthetization 7 cc   Patient prepped per Bronchoscopy Lab Policy       Insertion Route (Place X beside choice below)   Nasal   Oral  x Endotracheal Tube   Tracheostomy   INTRAPROCEDURE MEDICATIONS:  Sedative/Narcotic Amt Dose   Versed  mg   Fentanyl gtt100/hr mcg  Diprivan Gtt 10/hr mg       Medication Amt Dose  Medication Amt Dose  Lidocaine 1%  cc  Epinephrine 1:10,000 sol  cc  Xylocaine 4%  cc  Cocaine  cc   TECHNICAL PROCEDURES: (Place X beside choice below)   Procedures  Description    None     Electrocautery     Cryotherapy     Balloon Dilatation     Bronchography     Stent Placement     Therapeutic Aspiration Bilaterally including RLL, LLL, lingula and RML segments    Laser/Argon Plasma    Brachytherapy Catheter Placement    Foreign Body Removal  SPECIMENS (Sites): (Place X beside choice below)  Specimens Description   No Specimens Obtained     [place]    Lavage RML lavage x2   Biopsies    Fine Needle Aspirates    Brushings    Sputum    FINDINGS:  ESTIMATED BLOOD LOSS: none COMPLICATIONS/RESOLUTION: none      IMPRESSION:POST-PROCEDURE DX:   Mucoid impaction of subsegments in LLL, RML, RLL and RML lavage  RECOMMENDATION/PLAN:   Awaiting microbiology results    Ottie Glazier, M.D.  Pulmonary & [place]

## 2020-09-23 NOTE — Progress Notes (Signed)
[facility] for Electrolyte Monitoring and Replacement   Recent Labs: Potassium (mmol/L)  Date Value  09/23/2020 4.0   Magnesium (mg/dL)  Date Value  09/23/2020 1.8   Calcium (mg/dL)  Date Value  09/23/2020 8.2 (L)   Albumin (g/dL)  Date Value  09/23/2020 1.9 (L)   Phosphorus (mg/dL)  Date Value  09/23/2020 4.3   Sodium (mmol/L)  Date Value  09/23/2020 140    Assessment: 30 yo male admitted with perforated diverticulitis and severe acute hypoxemic respiratory failure secondary to COVID-19 infection. Patient has severe ARDs and was reintubated 1/21. Pharmacy has been consulted for electrolyte monitoring and replacement.   Patient is status post tracheostomy 1/25 and PEG 1/26. Nutrition provided via tube feeds. Patient is receiving intermittent diuresis.   Goal of Therapy:  Electrolytes WNL   Plan:  No replacement indicated.  Pharmacy will continue to follow and replace electrolytes as needed.   Tawnya Crook, PharmD 09/23/2020 8:32 AM

## 2020-09-23 NOTE — Progress Notes (Addendum)
CRITICAL CARE NOTE 30 yo male recent hospitalization for perforated diverticulitis s/p colostomy with reversal and leakage of anastomosis with s/p revision now acutely hypoxemic with COVID19 induced severe ARDS.  INTERVAL EVENTS:  08/11/20-patient continued to decline with worsening respiratory status despite 100%Fio2 on BIPAP and HFNC failure. He stated that he feels he is dying and cannot breathe. I discussed with wife [name] earlier today that patient is critically ill may need ETT, she is agreeable and thankful for care. I specifically discussed and explained intubation and mechanical ventilation to patient and he wishes to proceed.  08/12/20- patient was weaned on FiO2 to 60%. Spoke to wife [name].  08/13/20- Patient weaned to 40%. During SBT today he self extubated and was placed on HFNC. He subsequently became hypoxemic, disoriented aggitated, removed PIVs and colostomy and proceeded to have combative behavior with worsening oxygenation. I have attempted to calm patient and encouraged him to cooperate. After numerous attempts patient continued to be combative with severe aggitation with combative behavior and would not wear oxygen. Patient was placed on sedation and MV. Wife updated.  08/14/20- patient remains critically ill. Weaned from 100%>>55%. Called and updated wife [name] today  12/27-Patient with worsening oxygenation on FiO2 100% peep of 14  08/16/20-Patient with continued high oxygen requirements overnight. Have spoken with surgery and there should be no problem proning patient with this. Was febrile yesterday and thus broad spectrum abx started and cultures attained.  08-17-20-patient down to PEEP 10 FiO2 55%. Patient never required proning  08/18/20: FiO2 weaned to 40%, PEEP 10, plan for diuresis with Diamox today, adding PO Clonazepam to assist weaning sedation  08/19/20: Vent changed to Pressure Control this morning: 50% FiO2, 24, 22/5, Plan for Recruitment maneuvers and diuresis   08/20/2020: Continued issues with hypercapnia due to the dead space ventilation. Worsening despite pressure control. Switch back to [facility], prone positioning  08/23/19- patient proned today without incident, continue to wean FiO2 , currently on 50%  08/24/19- patient weaned to 40% FiO2, plan to continue proning with full scope of care. I have spoken to [name] (wife of patient today) she would like to be present prior to next extubation attempt to help console patient and decrease his aggitation.  1/5 failed weaning trials, ver agitated , severe hypoxia, Wife at bedside witnessed failed SAT.  1/6 severe resp failure, hypoxia  1/7 proning for 16 hrs, severe ARDS  1/8 PICC line placed, Had a brief PEA arrest while prone. Proning discontinued  1/10 severe resp failure  1/11 severe ARDS, ENT consulted fro [facility]  1/12 severe resp failure  09/02/20- patient with no acute events. Spoke with [name] today answered questions and plan is for trache.  09/03/20- patient remains on MV. FiO2 down to 35%PRVC plan for trache.  09/04/20- patient with TG>1000 will switch off propofol but patient with very high sedation requirement, will discuss options with pharmD. Plan for trache. Met with wife [name] at bedside.  1/20 extubated, wife at bedside, very weak cough, encephalopathy  1/21 re-intubated, severe resp failure  1/23 needs [facility] for survival  1/24 needs trach, severe resp failure  1/25 s/p [facility]  1/26 s/p PEG tube  1/27 unable to do TEE today  1/28 TEE deferred; new anemia (possibly related to recent tracheostomy)  1/30 recurrent fever  1/31 remains on vent, failure to wean  2/1 remains febrile, severe agitation  2/2 started on pressors, high fevers, low h/H 09/22/2020- patient with anasarca this am, he is lucid conversive with mild aggitation from  wires/trache/colostomy/foley.  LTACH has been approved.  Secretions are thick from [place] for theraputic aspiration of trachobronchial tree and BAL  for micorbiology , reviewed plan with RN at bedside and wife [name].            BP 127/67   Pulse (!) 125   Temp 99.4 F (37.4 C)   Resp (!) 28   Ht 6' 0.01" (1.829 m)   Wt 116.6 kg   SpO2 93%   BMI 34.86 kg/m    I/O last 3 completed shifts: In: 4292 [I.V.:1648.2; Blood:380; Other:195; NG/GT:1920; IV Piggyback:148.8] Out: 3662 [Urine:2435; Stool:775] No intake/output data recorded.  SpO2: 93 % O2 Flow Rate (L/min): 0 L/min FiO2 (%): 40 %  Estimated body mass index is 34.86 kg/m as calculated from the following:   Height as of this encounter: 6' 0.01" (1.829 m).   Weight as of this encounter: 116.6 kg.  SIGNIFICANT EVENTS   REVIEW OF SYSTEMS  PATIENT IS UNABLE TO PROVIDE COMPLETE REVIEW OF SYSTEMS DUE TO SEVERE CRITICAL ILLNESS   Pressure Injury 09/17/20 Coccyx Medial Stage 2 -  Partial thickness loss of dermis presenting as a shallow open injury with a red, pink wound bed without slough. (Active)  09/17/20 1010  Location: Coccyx  Location Orientation: Medial  Staging: Stage 2 -  Partial thickness loss of dermis presenting as a shallow open injury with a red, pink wound bed without slough.  Wound Description (Comments):   Present on Admission:       PHYSICAL EXAMINATION:  GENERAL:critically ill appearing, +resp distress, +anasarca EYES: Pupils equal, round, reactive to light.  No scleral icterus.  MOUTH: Moist mucosal membrane. NECK: Supple.  PULMONARY: +rhonchi,  CARDIOVASCULAR: S1 and S2. Regular rate and rhythm. No murmurs, rubs, or gallops.  GASTROINTESTINAL: Soft, nontender, -distended.  Positive bowel sounds.   MUSCULOSKELETAL: +edema.  NEUROLOGIC: more alert and awake SKIN:intact,warm,dry  MEDICATIONS: I have reviewed all medications and confirmed regimen as documented   CULTURE RESULTS   Recent Results (from the past 240 hour(s))  CULTURE, BLOOD (ROUTINE X 2) w Reflex to ID Panel     Status: None   Collection Time: 09/16/20  9:46 AM    Specimen: BLOOD LEFT HAND  Result Value Ref Range Status   Specimen Description BLOOD LEFT HAND  Final   Special Requests   Final    BOTTLES DRAWN AEROBIC ONLY Blood Culture results may not be optimal due to an inadequate volume of blood received in culture bottles   Culture   Final    NO GROWTH 5 DAYS Performed at [facility], [address] [place], [place] [address]    Report Status 09/21/2020 FINAL  Final  CULTURE, BLOOD (ROUTINE X 2) w Reflex to ID Panel     Status: None   Collection Time: 09/16/20  9:52 AM   Specimen: Joint, Finger; Blood  Result Value Ref Range Status   Specimen Description FINGER LEFT BLOOD  Final   Special Requests   Final    BOTTLES DRAWN AEROBIC AND ANAEROBIC Blood Culture adequate volume   Culture   Final    NO GROWTH 5 DAYS Performed at [facility], [address] [place], [place] [address]    Report Status 09/21/2020 FINAL  Final  Culture, respiratory (non-expectorated)     Status: None   Collection Time: 09/16/20 12:20 PM   Specimen: Tracheal Aspirate; Respiratory  Result Value Ref Range Status   Specimen Description   Final  TRACHEAL ASPIRATE Performed at [facility], [address], [place], [place] [address]    Special Requests   Final    NONE Performed at [facility], [address] [place], [place] [address]    Gram Stain   Final    RARE WBC PRESENT,BOTH PMN AND MONONUCLEAR MODERATE GRAM POSITIVE COCCI IN PAIRS Performed at [facility], [place] [address], [place], [place] [address]    Culture MODERATE STAPHYLOCOCCUS AUREUS  Final   Report Status 09/18/2020 FINAL  Final   Organism ID, Bacteria STAPHYLOCOCCUS AUREUS  Final      Susceptibility   Staphylococcus aureus - MIC*    CIPROFLOXACIN <=0.5 SENSITIVE Sensitive     ERYTHROMYCIN >=8 RESISTANT Resistant     GENTAMICIN <=0.5 SENSITIVE Sensitive     OXACILLIN 0.5 SENSITIVE Sensitive     TETRACYCLINE <=1 SENSITIVE Sensitive      VANCOMYCIN <=0.5 SENSITIVE Sensitive     TRIMETH/SULFA <=10 SENSITIVE Sensitive     CLINDAMYCIN RESISTANT Resistant     RIFAMPIN <=0.5 SENSITIVE Sensitive     Inducible Clindamycin POSITIVE Resistant     * MODERATE STAPHYLOCOCCUS AUREUS  Aerobic Culture (superficial specimen)     Status: None   Collection Time: 09/16/20  2:23 PM   Specimen: Skin, Cyst/Tag/Debridement; Wound  Result Value Ref Range Status   Specimen Description   Final    SKIN Performed at [facility], [address] [place], [place] [address]    Special Requests   Final    NONE Performed at [facility], [address] [place], [place] [address]    Gram Stain   Final    NO WBC SEEN MODERATE GRAM POSITIVE COCCI IN PAIRS FEW GRAM NEGATIVE RODS    Culture   Final    MODERATE STAPHYLOCOCCUS AUREUS WITHIN MIXED CULTURE Performed at [facility], [place] [address] [place], [place] [address]    Report Status 09/20/2020 FINAL  Final   Organism ID, Bacteria STAPHYLOCOCCUS AUREUS  Final      Susceptibility   Staphylococcus aureus - MIC*    CIPROFLOXACIN <=0.5 SENSITIVE Sensitive     ERYTHROMYCIN RESISTANT Resistant     GENTAMICIN <=0.5 SENSITIVE Sensitive     OXACILLIN 0.5 SENSITIVE Sensitive     TETRACYCLINE <=1 SENSITIVE Sensitive     VANCOMYCIN <=0.5 SENSITIVE Sensitive     TRIMETH/SULFA <=10 SENSITIVE Sensitive     CLINDAMYCIN RESISTANT Resistant     RIFAMPIN <=0.5 SENSITIVE Sensitive     Inducible Clindamycin POSITIVE Resistant     * MODERATE STAPHYLOCOCCUS AUREUS  MRSA PCR Screening     Status: None   Collection Time: 09/18/20  4:50 AM   Specimen: Nasopharyngeal  Result Value Ref Range Status   MRSA by PCR NEGATIVE NEGATIVE Final    Comment:        The GeneXpert MRSA Assay (FDA approved for NASAL specimens only), is one component of a comprehensive MRSA colonization surveillance program. It is not intended to diagnose MRSA infection nor to guide or monitor treatment  for MRSA infections. Performed at [facility], [address] [place], [place] [address]           IMAGING    CT ABDOMEN PELVIS WO CONTRAST  Result Date: 09/22/2020 CLINICAL DATA:  History of perforated diverticulitis and COVID induced ARDS. Evaluate for possible retroperitoneal hematoma. EXAM: CT CHEST, ABDOMEN AND PELVIS WITHOUT CONTRAST TECHNIQUE: Multidetector CT imaging of the chest, abdomen and pelvis was  performed following the standard protocol without IV contrast. COMPARISON:  CT scan 09/13/2020 FINDINGS: CT CHEST FINDINGS Cardiovascular: The tracheostomy tube is in good position without complicating features. Heart is mildly enlarged but stable. No pericardial effusion. The aorta is normal in caliber. Mediastinum/Nodes: Scattered mediastinal and hilar lymph nodes likely reactive given the lung findings. The esophagus is grossly normal. Lungs/Pleura: Extensive bilateral airspace process in the lungs suggesting infection/pneumonia. Small bilateral pleural effusions. No pneumothorax. Some debris is noted in the right mainstem bronchus. Musculoskeletal: No significant bony findings. CT ABDOMEN PELVIS FINDINGS Hepatobiliary: No hepatic lesions are identified without contrast. The hepatic contour is irregular and the caudate lobe is enlarged suggesting cirrhosis. Gallbladder is contracted. No common bile duct dilatation. Pancreas: No mass, inflammation or ductal dilatation. Spleen: Splenomegaly. Spleen measures 15 x 14 x 10 cm. No lesions are identified. Adrenals/Urinary Tract: Adrenal glands and kidneys are unremarkable. The bladder is grossly normal. There is some gas in the bladder likely from recent catheterization. Stomach/Bowel: The stomach contains a feeding gastrostomy tube. No complicating features are identified. Duodenum and small bowel are grossly normal. There are surgical changes involving the distal ileum along with a left lower quadrant colostomy and a Hartmann's pouch.  Suspect moderate inflammatory process involving the right colon with colonic wall thickening and pericolonic interstitial changes. I do not see a definite intra-abdominal/intrapelvic abscess or free air. Vascular/Lymphatic: The aorta and branch vessels are grossly normal. No mesenteric or retroperitoneal mass or adenopathy. Scattered lymph nodes are likely reactive. Reproductive: The prostate gland and seminal vesicles are unremarkable. Other: No free abdominal or free pelvic fluid collections to suggest an abscess. No pelvic adenopathy. Moderate subcutaneous edema and skin thickening and both hip and thigh regions possibly reflecting cellulitis. Musculoskeletal: No acute bony findings. Chronic changes of bilateral hip AVN. IMPRESSION: 1. Extensive bilateral airspace process suggesting infection/pneumonia. 2. Small bilateral pleural effusions. 3. Tracheostomy tube and feeding gastrostomy tube in good position without complicating features. 4. Suspect moderate inflammatory process involving the right colon with colonic wall thickening and pericolonic interstitial changes. No definite intra-abdominal/intrapelvic abscess or free air. 5. 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 6. Moderate subcutaneous edema and skin thickening and both hip and thigh regions possibly reflecting cellulitis. 7. Changes of chronic bilateral hip AVN. Electronically Signed   By: Marijo Sanes M.D.   On: 09/22/2020 13:23   CT CHEST WO CONTRAST  Result Date: 09/22/2020 CLINICAL DATA:  History of perforated diverticulitis and COVID induced ARDS. Evaluate for possible retroperitoneal hematoma. EXAM: CT CHEST, ABDOMEN AND PELVIS WITHOUT CONTRAST TECHNIQUE: Multidetector CT imaging of the chest, abdomen and pelvis was performed following the standard protocol without IV contrast. COMPARISON:  CT scan 09/13/2020 FINDINGS: CT CHEST FINDINGS Cardiovascular: The tracheostomy tube is in good position without complicating features. Heart is  mildly enlarged but stable. No pericardial effusion. The aorta is normal in caliber. Mediastinum/Nodes: Scattered mediastinal and hilar lymph nodes likely reactive given the lung findings. The esophagus is grossly normal. Lungs/Pleura: Extensive bilateral airspace process in the lungs suggesting infection/pneumonia. Small bilateral pleural effusions. No pneumothorax. Some debris is noted in the right mainstem bronchus. Musculoskeletal: No significant bony findings. CT ABDOMEN PELVIS FINDINGS Hepatobiliary: No hepatic lesions are identified without contrast. The hepatic contour is irregular and the caudate lobe is enlarged suggesting cirrhosis. Gallbladder is contracted. No common bile duct dilatation. Pancreas: No mass, inflammation or ductal dilatation. Spleen: Splenomegaly. Spleen measures 15 x 14 x 10 cm. No lesions are identified. Adrenals/Urinary Tract: Adrenal glands and kidneys are  unremarkable. The bladder is grossly normal. There is some gas in the bladder likely from recent catheterization. Stomach/Bowel: The stomach contains a feeding gastrostomy tube. No complicating features are identified. Duodenum and small bowel are grossly normal. There are surgical changes involving the distal ileum along with a left lower quadrant colostomy and a Hartmann's pouch. Suspect moderate inflammatory process involving the right colon with colonic wall thickening and pericolonic interstitial changes. I do not see a definite intra-abdominal/intrapelvic abscess or free air. Vascular/Lymphatic: The aorta and branch vessels are grossly normal. No mesenteric or retroperitoneal mass or adenopathy. Scattered lymph nodes are likely reactive. Reproductive: The prostate gland and seminal vesicles are unremarkable. Other: No free abdominal or free pelvic fluid collections to suggest an abscess. No pelvic adenopathy. Moderate subcutaneous edema and skin thickening and both hip and thigh regions possibly reflecting cellulitis.  Musculoskeletal: No acute bony findings. Chronic changes of bilateral hip AVN. IMPRESSION: 1. Extensive bilateral airspace process suggesting infection/pneumonia. 2. Small bilateral pleural effusions. 3. Tracheostomy tube and feeding gastrostomy tube in good position without complicating features. 4. Suspect moderate inflammatory process involving the right colon with colonic wall thickening and pericolonic interstitial changes. No definite intra-abdominal/intrapelvic abscess or free air. 5. 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 6. Moderate subcutaneous edema and skin thickening and both hip and thigh regions possibly reflecting cellulitis. 7. Changes of chronic bilateral hip AVN. Electronically Signed   By: Marijo Sanes M.D.   On: 09/22/2020 13:23   CT CERVICAL SPINE WO CONTRAST  Result Date: 09/22/2020 CLINICAL DATA:  Epidural abscess. EXAM: CT CERVICAL SPINE WITHOUT CONTRAST TECHNIQUE: Multidetector CT imaging of the cervical spine was performed without intravenous contrast. Multiplanar CT image reconstructions were also generated. COMPARISON:  None. FINDINGS: Alignment: Normal Skull base and vertebrae: No acute fracture. No primary bone lesion or focal pathologic process. Soft tissues and spinal canal: No prevertebral fluid or swelling. No visible canal hematoma. Disc levels: Very generous spinal canal. No large disc protrusions, spinal or foraminal stenosis. No findings suspicious for epidural hematoma or abscess. Upper chest: Significant bilateral lung infiltrates. Other: Tracheostomy tube in good position. IMPRESSION: 1. Normal alignment and no acute bony findings. 2. Very generous spinal canal. No findings suspicious for epidural hematoma or abscess. 3. Significant bilateral lung infiltrates. Electronically Signed   By: Marijo Sanes M.D.   On: 09/22/2020 13:25   US Venous Img Lower Bilateral (DVT)  Result Date: 09/22/2020 CLINICAL DATA:  Swelling EXAM: BILATERAL LOWER EXTREMITY VENOUS  DOPPLER ULTRASOUND TECHNIQUE: Gray-scale sonography with compression, as well as color and duplex ultrasound, were performed to evaluate the deep venous system(s) from the level of the common femoral vein through the popliteal and proximal calf veins. COMPARISON:  09/08/2020 FINDINGS: VENOUS Normal compressibility of the common femoral, superficial femoral, and popliteal veins, as well as the visualized calf veins. Visualized portions of profunda femoral vein and great saphenous vein unremarkable. No filling defects to suggest DVT on grayscale or color Doppler imaging. Doppler waveforms show normal direction of venous flow, normal respiratory plasticity and response to augmentation. OTHER None. Limitations: Limited visualization of the right peroneal veins IMPRESSION: No lower extremity DVT Electronically Signed   By: Miachel Roux M.D.   On: 09/22/2020 14:06   CT T-SPINE NO CHARGE  Result Date: 09/22/2020 CLINICAL DATA:  Evaluate for epidural abscess. EXAM: CT THORACIC SPINE WITHOUT CONTRAST TECHNIQUE: Multidetector CT images of the thoracic were obtained using the standard protocol without intravenous contrast. COMPARISON:  None. FINDINGS: Alignment: Normal Vertebrae: No destructive bony  changes to suggest discitis or osteomyelitis. Paraspinal and other soft tissues: No significant findings. Disc levels: No large disc protrusions, spinal or foraminal stenosis. No findings suspicious for epidural hematoma or abscess. IMPRESSION: Unremarkable CT examination of the thoracic spine. No findings suspicious for discitis or osteomyelitis. Electronically Signed   By: Marijo Sanes M.D.   On: 09/22/2020 13:12   CT L-SPINE NO CHARGE  Result Date: 09/22/2020 CLINICAL DATA:  History of perforated diverticulitis status post colostomy acute hypoxemia with COVID-19 induced ARDS. Evaluate for possible epidural abscess. EXAM: CT LUMBAR SPINE WITHOUT CONTRAST TECHNIQUE: Multidetector CT imaging of the lumbar spine was performed  without intravenous contrast administration. Multiplanar CT image reconstructions were also generated. COMPARISON:  None. FINDINGS: Examination is limited by body habitus and some motion artifact. Segmentation: There are five lumbar type vertebral bodies. The last full intervertebral disc space is labeled L5-S1. Alignment: Normal Vertebrae: Grossly normal. No findings suspicious for discitis or osteomyelitis. Paraspinal and other soft tissues: No significant paraspinal or retroperitoneal findings. Disc levels: No large disc protrusions, spinal or foraminal stenosis. No obvious epidural hematoma or abscess. IMPRESSION: 1. Limited examination due to body habitus and some motion artifact. 2. No findings suspicious for discitis, osteomyelitis or epidural abscess. 3. No large disc protrusions, spinal or foraminal stenosis. Electronically Signed   By: Marijo Sanes M.D.   On: 09/22/2020 13:11     Nutrition Status: Nutrition Problem: Increased nutrient needs Etiology: catabolic illness (GNFAO-13) Signs/Symptoms: estimated needs Interventions: Refer to RD note for recommendations           Central Line/ continued, requirement due to  Reason to continue [employer] of central venous pressure or other hemodynamic parameters and poor IV access   Ventilator continued, requirement due to severe respiratory failure   Ventilator Sedation RASS 0 to -2       ASSESSMENT AND PLAN SYNOPSIS  30 yo male recent hospitalization for perforated diverticulitis s/p colostomy with reversal and leakage of anastomosis with s/p revision now acutely hypoxemic with COVID19 induced severe ARDS.failure to wean from VENT-self extubation and trial of extubation failed due to Severe encephalopathy leading to inability to protect airway. Tracheostomy performed 1/25.PEG placed 1/26 Developed high fevers with septic shock in last 2 days on pressors   Severe ACUTE Hypoxic and Hypercapnic Respiratory  Failure -continue Full MV support -continue Bronchodilator Therapy -Wean Fio2 and PEEP as tolerated -will perform SAT/SBT when respiratory parameters are met -VAP/VENT bundle implementation - patient may have superimposed secondary bacterial pneumonia post COVID19, currently on zyvox per ID - appreciate input.  No signs of rigidity on examination with low suspicion for serotonin syndrome, patient is conversive.     ACUTE DAISTOLIC CARDIAC FAILURE- EF -oxygen as needed -Lasix as tolerated    -will hold coreg today.  Morbid obesity, possible OSA.   Will certainly impact respiratory mechanics, ventilator weaning Suspect will need to consider additional PEEP    NEUROLOGY More alert and awake  SHOCK-SEPSIS -use vasopressors to keep MAP>65 as needed -follow ABG and LA -follow up cultures -emperic ABX  CARDIAC ICU monitoring  ID-FUO -continue IV abx as prescibed -follow up cultures CT abd pelvis, CT chest and CT spine and [place] LE PENDING   GI GI PROPHYLAXIS as indicated  DIET-->TF's as tolerated Constipation protocol as indicated  ENDO - will use ICU hypoglycemic\Hyperglycemia protocol if indicated     ELECTROLYTES -follow labs as needed -replace as needed -pharmacy consultation and following   DVT/GI PRX ordered and assessed TRANSFUSIONS  AS NEEDED MONITOR FSBS I Assessed the need for Labs I Assessed the need for Foley I Assessed the need for Central Venous Line Family Discussion when available I Assessed the need for Mobilization I made an Assessment of medications to be adjusted accordingly Safety Risk assessment completed   CASE DISCUSSED IN MULTIDISCIPLINARY ROUNDS WITH ICU TEAM  Critical Care Time devoted to patient care services described in this note is 32 minutes.   Overall, patient is critically ill, prognosis is guarded.  Patient with Multiorgan failure and at high risk for cardiac arrest and death.     Ottie Glazier, M.D.  Pulmonary &  [place]

## 2020-09-24 ENCOUNTER — Ambulatory Visit (HOSPITAL_COMMUNITY)
Admission: AD | Admit: 2020-09-24 | Discharge: 2020-09-24 | Disposition: A | Discharge status: Home / Self Care | Payer: No Typology Code available for payment source | Source: Other Acute Inpatient Hospital | Attending: Internal Medicine | Admitting: Internal Medicine

## 2020-09-24 DIAGNOSIS — J9690 Respiratory failure, unspecified, unspecified whether with hypoxia or hypercapnia: Secondary | ICD-10-CM | POA: Insufficient documentation

## 2020-09-24 LAB — GLUCOSE, CAPILLARY
Glucose-Capillary: 66 mg/dL — ABNORMAL LOW (ref 70–99)
Glucose-Capillary: 93 mg/dL (ref 70–99)
Glucose-Capillary: 98 mg/dL (ref 70–99)
Glucose-Capillary: 127 mg/dL — ABNORMAL HIGH (ref 70–99)
Glucose-Capillary: 127 mg/dL — ABNORMAL HIGH (ref 70–99)

## 2020-09-24 LAB — CBC WITH DIFFERENTIAL/PLATELET
Abs Immature Granulocytes: 1.11 10*3/uL — ABNORMAL HIGH (ref 0.00–0.07)
Basophils Absolute: 0 10*3/uL (ref 0.0–0.1)
Basophils Relative: 0 %
Eosinophils Absolute: 0 10*3/uL (ref 0.0–0.5)
Eosinophils Relative: 0 %
HCT: 26.4 % — ABNORMAL LOW (ref 39.0–52.0)
Hemoglobin: 7.7 g/dL — ABNORMAL LOW (ref 13.0–17.0)
Immature Granulocytes: 11 %
Lymphocytes Relative: 20 %
Lymphs Abs: 2.1 10*3/uL (ref 0.7–4.0)
MCH: 26.9 pg (ref 26.0–34.0)
MCHC: 29.2 g/dL — ABNORMAL LOW (ref 30.0–36.0)
MCV: 92.3 fL (ref 80.0–100.0)
Monocytes Absolute: 1.2 10*3/uL — ABNORMAL HIGH (ref 0.1–1.0)
Monocytes Relative: 11 %
Neutro Abs: 6 10*3/uL (ref 1.7–7.7)
Neutrophils Relative %: 58 %
Platelets: 370 10*3/uL (ref 150–400)
RBC: 2.86 MIL/uL — ABNORMAL LOW (ref 4.22–5.81)
RDW: 17.2 % — ABNORMAL HIGH (ref 11.5–15.5)
Smear Review: NORMAL
WBC: 10.4 10*3/uL (ref 4.0–10.5)
nRBC: 0.7 % — ABNORMAL HIGH (ref 0.0–0.2)

## 2020-09-24 LAB — RENAL FUNCTION PANEL
Albumin: 2 g/dL — ABNORMAL LOW (ref 3.5–5.0)
Anion gap: 11 (ref 5–15)
BUN: 11 mg/dL (ref 6–20)
CO2: 34 mmol/L — ABNORMAL HIGH (ref 22–32)
Calcium: 8.6 mg/dL — ABNORMAL LOW (ref 8.9–10.3)
Chloride: 96 mmol/L — ABNORMAL LOW (ref 98–111)
Creatinine, Ser: <0.3 mg/dL — ABNORMAL LOW (ref 0.61–1.24)
Glucose, Bld: 70 mg/dL (ref 70–99)
Phosphorus: 4.3 mg/dL (ref 2.5–4.6)
Potassium: 2.9 mmol/L — ABNORMAL LOW (ref 3.5–5.1)
Sodium: 141 mmol/L (ref 135–145)

## 2020-09-24 LAB — THYROID PANEL WITH TSH
Free Thyroxine Index: 1.4 (ref 1.2–4.9)
T3 Uptake Ratio: 36 % (ref 24–39)
T4, Total: 4 ug/dL — ABNORMAL LOW (ref 4.5–12.0)
TSH: 1.51 u[IU]/mL (ref 0.450–4.500)

## 2020-09-24 LAB — MAGNESIUM: Magnesium: 1.7 mg/dL (ref 1.7–2.4)

## 2020-09-24 MED ORDER — FENTANYL 75 MCG/HR TD PT72: 1.0000 | MEDICATED_PATCH | TRANSDERMAL | 0 refills | Status: DC

## 2020-09-24 MED ORDER — POTASSIUM CHLORIDE 10 MEQ/100ML IV SOLN
10.0000 meq | INTRAVENOUS | Status: AC
Start: 2020-09-24 — End: 2020-09-24
  Administered 2020-09-24 (×4): 10 meq via INTRAVENOUS
  Filled 2020-09-24 (×4): qty 100

## 2020-09-24 MED ORDER — LORAZEPAM 2 MG/ML IJ SOLN: 2.0000 mg | INTRAMUSCULAR | 0 refills | Status: DC | PRN

## 2020-09-24 MED ORDER — HYDROCORTISONE NA SUCCINATE PF 100 MG IJ SOLR: 50.0000 mg | Freq: Two times a day (BID) | INTRAMUSCULAR | Status: DC

## 2020-09-24 MED ORDER — FUROSEMIDE 10 MG/ML IJ SOLN: 40.0000 mg | Freq: Every day | INTRAMUSCULAR | 0 refills | Status: DC

## 2020-09-24 MED ORDER — CLOTRIMAZOLE-BETAMETHASONE 1-0.05 % EX CREA: TOPICAL_CREAM | Freq: Two times a day (BID) | CUTANEOUS | 0 refills | Status: DC

## 2020-09-24 MED ORDER — PIPERACILLIN-TAZOBACTAM 3.375 G IVPB: 3.3750 g | Freq: Three times a day (TID) | INTRAVENOUS | 0 refills | Status: DC

## 2020-09-24 MED ORDER — CLONAZEPAM 1 MG PO TABS: 1.0000 mg | ORAL_TABLET | Freq: Two times a day (BID) | ORAL | 0 refills | Status: DC

## 2020-09-24 MED ORDER — POTASSIUM CHLORIDE CRYS ER 20 MEQ PO TBCR: 40.0000 meq | EXTENDED_RELEASE_TABLET | Freq: Once | ORAL | Status: DC

## 2020-09-24 MED ORDER — PROSOURCE TF PO LIQD: 45.0000 mL | Freq: Three times a day (TID) | ORAL | Status: DC

## 2020-09-24 MED ORDER — METOPROLOL TARTRATE 5 MG/5ML IV SOLN: 5.0000 mg | Freq: Four times a day (QID) | INTRAVENOUS | Status: DC | PRN

## 2020-09-24 MED ORDER — VITAL 1.5 CAL PO LIQD: 1000.0000 mL | ORAL | Status: DC

## 2020-09-24 MED ORDER — DOXAZOSIN MESYLATE 1 MG PO TABS: 1.0000 mg | ORAL_TABLET | Freq: Every day | ORAL | Status: DC

## 2020-09-24 MED ORDER — MAGNESIUM SULFATE 2 GM/50ML IV SOLN
2.0000 g | Freq: Once | INTRAVENOUS | Status: AC
  Administered 2020-09-24: 2 g via INTRAVENOUS
  Filled 2020-09-24: qty 50

## 2020-09-24 NOTE — Progress Notes (Signed)
[place] for Electrolyte Monitoring and Replacement   Recent Labs: Potassium (mmol/L)  Date Value  09/24/2020 2.9 (L)   Magnesium (mg/dL)  Date Value  09/24/2020 1.7   Calcium (mg/dL)  Date Value  09/24/2020 8.6 (L)   Albumin (g/dL)  Date Value  09/24/2020 2.0 (L)   Phosphorus (mg/dL)  Date Value  09/24/2020 4.3   Sodium (mmol/L)  Date Value  09/24/2020 141    Assessment: 30 yo male admitted with perforated diverticulitis and severe acute hypoxemic respiratory failure secondary to COVID-19 infection. Patient has severe ARDs and was reintubated 1/21. Pharmacy has been consulted for electrolyte monitoring and replacement.   Patient is status post tracheostomy 1/25 and PEG 1/26. Nutrition provided via tube feeds. Patient is receiving intermittent diuresis.   On lasix 40 mg IV daily.   Goal of Therapy:  Electrolytes WNL   Plan:  Will give KCl 10 mEq IV x 4 + KCl 40 mEq PO x 1.  Will give Mg 2 g IV x 1 .   Pharmacy will continue to follow and replace electrolytes as needed.   [name], PharmD 09/24/2020 9:47 AM

## 2020-09-24 NOTE — Discharge Summary (Addendum)
CRITICAL CARE - DISCHARGE SUMMARY   Follow up: Please resume the following: 1. PICC line care per protocol at [facility] 2. Routine trach and PEG are per protocol at [facility]   Brief summary:  30 yo male recent hospitalization for perforated diverticulitis s/p colostomy with reversal and leakage of anastomosis with s/p revision now acutely hypoxemic with COVID19 induced severe ARDS.    INTERVAL EVENTS:   08/11/20-patient continued to decline with worsening respiratory status despite 100%Fio2 on BIPAP and HFNC failure. He stated that he feels he is dying and cannot breathe. I discussed with wife [name] earlier today that patient is critically ill may need ETT, she is agreeable and thankful for care. I specifically discussed and explained intubation and mechanical ventilation to patient and he wishes to proceed.  08/12/20- patient was weaned on FiO2 to 60%. Spoke to wife [name].  08/13/20- Patient weaned to 40%. During SBT today he self extubated and was placed on HFNC. He subsequently became hypoxemic, disoriented aggitated, removed PIVs and colostomy and proceeded to have combative behavior with worsening oxygenation. I have attempted to calm patient and encouraged him to cooperate. After numerous attempts patient continued to be combative with severe aggitation with combative behavior and would not wear oxygen. Patient was placed on sedation and MV. Wife updated.  08/14/20- patient remains critically ill. Weaned from 100%>>55%. Called and updated wife [name] today  12/27-Patient with worsening oxygenation on FiO2 100% peep of 14  08/16/20-Patient with continued high oxygen requirements overnight. Have spoken with surgery and there should be no problem proning patient with this. Was febrile yesterday and thus broad spectrum abx started and cultures attained.  08-17-20-patient down to PEEP 10 FiO2 55%. Patient never required proning  08/18/20: FiO2 weaned to 40%, PEEP 10, plan for diuresis with  Diamox today, adding PO Clonazepam to assist weaning sedation  08/19/20: Vent changed to Pressure Control this morning: 50% FiO2, 24, 22/5, Plan for Recruitment maneuvers and diuresis  08/20/2020: Continued issues with hypercapnia due to the dead space ventilation. Worsening despite pressure control. Switch back to [facility], prone positioning  08/23/19- patient proned today without incident, continue to wean FiO2 , currently on 50%  08/24/19- patient weaned to 40% FiO2, plan to continue proning with full scope of care. I have spoken to [name] (wife of patient today) she would like to be present prior to next extubation attempt to help console patient and decrease his aggitation.  1/5 failed weaning trials, ver agitated , severe hypoxia, Wife at bedside witnessed failed SAT.  1/6 severe resp failure, hypoxia  1/7 proning for 16 hrs, severe ARDS  1/8 PICC line placed, Had a brief PEA arrest while prone. Proning discontinued  1/10 severe resp failure  1/11 severe ARDS, ENT consulted fro [facility]  1/12 severe resp failure  09/02/20- patient with no acute events. Spoke with [name] today answered questions and plan is for trache.  09/03/20- patient remains on MV. FiO2 down to 35%PRVC plan for trache.  09/04/20- patient with TG>1000 will switch off propofol but patient with very high sedation requirement, will discuss options with pharmD. Plan for trache. Met with wife [name] at bedside.  1/20 extubated, wife at bedside, very weak cough, encephalopathy  1/21 re-intubated, severe resp failure  1/23 needs [facility] for survival  1/24 needs trach, severe resp failure  1/25 s/p [facility]  1/26 s/p PEG tube  1/27 unable to do TEE today  1/28 TEE deferred; new anemia (possibly related to recent tracheostomy)  1/30 recurrent fever  1/31 remains on vent, failure to wean  2/1 remains febrile, severe agitation  2/2 started on pressors, high fevers, low h/H 09/23/2020- patient with anasarca this am, he is lucid conversive with  mild aggitation from wires/trache/colostomy/foley.  LTACH has been approved.  Secretions are thick from [place] for theraputic aspiration of trachobronchial tree and BAL for micorbiology , reviewed plan with RN at bedside and wife [name].    09/24/2020- patient is improved, he is speaking through trache.  Microbiology including gram stain from bronchoscopy with BAL is thus far negative for ongoing infection which is reassuring.  I discussed case with ID Dr Ramon Dredge yesterday and patient is still appropriate for [facility] with initiation of PT/OT and continuing antimicrobials at this time.  His edema is improved. Plan to d/c to [facility] today.  Patient required fentanyl patch for pain which may need to be continued, he also recived trache and peg care which should be continued.  He may need PRN meds such as anxiety and BP meds if his blood pressure becomes elevated. He received nourished via PEG which should be continued after re-assessment. Upon admission to [facility] he should be re-evaluated for any additional PRN medications as his clinical course is progressive and dynamic, currently he is recovering from MICU hospitalization post COVID and surgery for perforated diverticulitis.        BP 133/73   Pulse (!) 126   Temp 99.5 F (37.5 C) (Oral)   Resp (!) 31   Ht 6' 0.01" (1.829 m)   Wt 116.6 kg   SpO2 94%   BMI 34.86 kg/m    I/O last 3 completed shifts: In: 1025.5 [I.V.:650.4; Other:120; NG/GT:80; IV Piggyback:175.1] Out: 5700 [Urine:4850; Stool:850] Total I/O In: 450 [I.V.:10; NG/GT:240; IV Piggyback:200] Out: 2050 [Urine:2050]  SpO2: 94 % O2 Flow Rate (L/min): 0 L/min FiO2 (%): 40 %  Estimated body mass index is 34.86 kg/m as calculated from the following:   Height as of this encounter: 6' 0.01" (1.829 m).   Weight as of this encounter: 116.6 kg.    REVIEW OF SYSTEMS  10 point ROS is done and is negative except as per subjective findings.   Pressure Injury  09/17/20 Coccyx Medial Stage 2 -  Partial thickness loss of dermis presenting as a shallow open injury with a red, pink wound bed without slough. (Active)  09/17/20 1010  Location: Coccyx  Location Orientation: Medial  Staging: Stage 2 -  Partial thickness loss of dermis presenting as a shallow open injury with a red, pink wound bed without slough.  Wound Description (Comments):   Present on Admission:     PHYSICAL EXAMINATION:  GENERAL:age appropriate , no distress this am EYES: Pupils equal, round, reactive to light.  No scleral icterus.  MOUTH: Moist mucosal membrane. NECK: Supple. +tracheostomy PULMONARY: +rhonchi and MV sounds in background CARDIOVASCULAR: S1 and S2. Regular rate and rhythm. No murmurs, rubs, or gallops.  GASTROINTESTINAL: Soft, nontender, -distended.  Positive bowel sounds.  +colostomy MUSCULOSKELETAL: +edema.  NEUROLOGIC: more alert and awake SKIN:intact,warm,dry  Allergies as of 09/24/2020      Reactions   Amoxicillin Rash   Tolerated cefepime and cefazolin 08/2020.      Medication List    STOP taking these medications   allopurinol 300 MG tablet Commonly known as: ZYLOPRIM   amLODipine-valsartan 10-320 MG tablet Commonly known as: EXFORGE   carvedilol 25 MG tablet Commonly known as: COREG   ondansetron 4 MG disintegrating tablet Commonly known as: ZOFRAN-ODT  spironolactone 25 MG tablet Commonly known as: ALDACTONE     TAKE these medications   clonazePAM 1 MG tablet Commonly known as: KLONOPIN Place 1 tablet (1 mg total) into feeding tube 2 (two) times daily.   clotrimazole-betamethasone cream Commonly known as: LOTRISONE Apply topically 2 (two) times daily.   doxazosin 1 MG tablet Commonly known as: CARDURA Place 1 tablet (1 mg total) into feeding tube daily. Start taking on: September 25, 2020   feeding supplement (PROSource TF) liquid Place 45 mLs into feeding tube 3 (three) times daily.   feeding supplement (VITAL 1.5 CAL)  Liqd Place 1,000 mLs into feeding tube continuous.   fentaNYL 75 MCG/HR Commonly known as: [place] 1 patch onto the skin every 3 (three) days. Start taking on: September 27, 2020   furosemide 10 MG/ML injection Commonly known as: LASIX Inject 4 mLs (40 mg total) into the vein daily. Start taking on: September 25, 2020   hydrocortisone sodium succinate 100 MG Solr injection Commonly known as: SOLU-CORTEF Inject 1 mL (50 mg total) into the vein every 12 (twelve) hours.   LORazepam 2 MG/ML injection Commonly known as: ATIVAN Inject 1 mL (2 mg total) into the vein every 4 (four) hours as needed for sedation.   metoprolol tartrate 5 MG/5ML Soln injection Commonly known as: LOPRESSOR Inject 5 mLs (5 mg total) into the vein every 6 (six) hours as needed (sustained HR > 125).   piperacillin-tazobactam 3.375 GM/50ML IVPB Commonly known as: ZOSYN Inject 50 mLs (3.375 g total) into the vein every 8 (eight) hours.        MEDICATIONS: I have reviewed all medications and confirmed regimen as documented   CULTURE RESULTS   Recent Results (from the past 240 hour(s))  CULTURE, BLOOD (ROUTINE X 2) w Reflex to ID Panel     Status: None   Collection Time: 09/16/20  9:46 AM   Specimen: BLOOD LEFT HAND  Result Value Ref Range Status   Specimen Description BLOOD LEFT HAND  Final   Special Requests   Final    BOTTLES DRAWN AEROBIC ONLY Blood Culture results may not be optimal due to an inadequate volume of blood received in culture bottles   Culture   Final    NO GROWTH 5 DAYS Performed at [facility], [address] [place], [place] [address]    Report Status 09/21/2020 FINAL  Final  CULTURE, BLOOD (ROUTINE X 2) w Reflex to ID Panel     Status: None   Collection Time: 09/16/20  9:52 AM   Specimen: Joint, Finger; Blood  Result Value Ref Range Status   Specimen Description FINGER LEFT BLOOD  Final   Special Requests   Final    BOTTLES DRAWN AEROBIC AND ANAEROBIC Blood  Culture adequate volume   Culture   Final    NO GROWTH 5 DAYS Performed at [facility], [address], [place], [place] [address]    Report Status 09/21/2020 FINAL  Final  Culture, respiratory (non-expectorated)     Status: None   Collection Time: 09/16/20 12:20 PM   Specimen: Tracheal Aspirate; Respiratory  Result Value Ref Range Status   Specimen Description   Final    TRACHEAL ASPIRATE Performed at [facility], [address], [place], [place] [address]    Special Requests   Final    NONE Performed at [facility], [address] [place], [place] [address]    Gram Stain   Final  RARE WBC PRESENT,BOTH PMN AND MONONUCLEAR MODERATE GRAM POSITIVE COCCI IN PAIRS Performed at [facility], [place] [address], [place], [place] [address]    Culture MODERATE STAPHYLOCOCCUS AUREUS  Final   Report Status 09/18/2020 FINAL  Final   Organism ID, Bacteria STAPHYLOCOCCUS AUREUS  Final      Susceptibility   Staphylococcus aureus - MIC*    CIPROFLOXACIN <=0.5 SENSITIVE Sensitive     ERYTHROMYCIN >=8 RESISTANT Resistant     GENTAMICIN <=0.5 SENSITIVE Sensitive     OXACILLIN 0.5 SENSITIVE Sensitive     TETRACYCLINE <=1 SENSITIVE Sensitive     VANCOMYCIN <=0.5 SENSITIVE Sensitive     TRIMETH/SULFA <=10 SENSITIVE Sensitive     CLINDAMYCIN RESISTANT Resistant     RIFAMPIN <=0.5 SENSITIVE Sensitive     Inducible Clindamycin POSITIVE Resistant     * MODERATE STAPHYLOCOCCUS AUREUS  Aerobic Culture (superficial specimen)     Status: None   Collection Time: 09/16/20  2:23 PM   Specimen: Skin, Cyst/Tag/Debridement; Wound  Result Value Ref Range Status   Specimen Description   Final    SKIN Performed at [facility], [address] [place], [place] [address]    Special Requests   Final    NONE Performed at [facility], [address] [place], [place] [address]    Gram Stain   Final    NO WBC SEEN MODERATE GRAM POSITIVE COCCI IN  PAIRS FEW GRAM NEGATIVE RODS    Culture   Final    MODERATE STAPHYLOCOCCUS AUREUS WITHIN MIXED CULTURE Performed at [facility], [place] [address] [place], [place] [address]    Report Status 09/20/2020 FINAL  Final   Organism ID, Bacteria STAPHYLOCOCCUS AUREUS  Final      Susceptibility   Staphylococcus aureus - MIC*    CIPROFLOXACIN <=0.5 SENSITIVE Sensitive     ERYTHROMYCIN RESISTANT Resistant     GENTAMICIN <=0.5 SENSITIVE Sensitive     OXACILLIN 0.5 SENSITIVE Sensitive     TETRACYCLINE <=1 SENSITIVE Sensitive     VANCOMYCIN <=0.5 SENSITIVE Sensitive     TRIMETH/SULFA <=10 SENSITIVE Sensitive     CLINDAMYCIN RESISTANT Resistant     RIFAMPIN <=0.5 SENSITIVE Sensitive     Inducible Clindamycin POSITIVE Resistant     * MODERATE STAPHYLOCOCCUS AUREUS  MRSA PCR Screening     Status: None   Collection Time: 09/18/20  4:50 AM   Specimen: Nasopharyngeal  Result Value Ref Range Status   MRSA by PCR NEGATIVE NEGATIVE Final    Comment:        The GeneXpert MRSA Assay (FDA approved for NASAL specimens only), is one component of a comprehensive MRSA colonization surveillance program. It is not intended to diagnose MRSA infection nor to guide or monitor treatment for MRSA infections. Performed at [facility], [address] [place], [place] [address]   Culture, BAL-quantitative     Status: None (Preliminary result)   Collection Time: 09/23/20  3:00 PM   Specimen: Bronchoalveolar Lavage; Respiratory  Result Value Ref Range Status   Specimen Description   Final    BRONCHIAL ALVEOLAR LAVAGE Performed at [facility], [address], [place], [place] [address]    Special Requests   Final    Immunocompromised Performed at [facility], [address] [place], [place] [address]    Gram Stain   Final    FEW WBC PRESENT, PREDOMINANTLY MONONUCLEAR NO ORGANISMS SEEN Performed at [facility], [place]  [address], [place], [place] [address]     Culture PENDING  Incomplete   Report Status PENDING  Incomplete          IMAGING    No results found.   Nutrition Status: Nutrition Problem: Increased nutrient needs Etiology: catabolic illness ([id number]) Signs/Symptoms: estimated needs Interventions: Refer to RD note for recommendations           Central Line/ continued, requirement due to  Reason to continue [place] of central venous pressure or other hemodynamic parameters and poor IV access   Ventilator continued, requirement due to severe respiratory failure   Ventilator Sedation RASS 0 to -2       ASSESSMENT AND PLAN SYNOPSIS  30 yo male recent hospitalization for perforated diverticulitis s/p colostomy with reversal and leakage of anastomosis with s/p revision now acutely hypoxemic with COVID19 induced severe ARDS.failure to wean from VENT-self extubation and trial of extubation failed due to Severe encephalopathy leading to inability to protect airway. Tracheostomy performed 1/25.PEG placed 1/26 Developed high fevers with septic shock in last 2 days on pressors   Severe ACUTE Hypoxic and Hypercapnic Respiratory Failure -continue Full MV support -continue Bronchodilator Therapy -Wean Fio2 and PEEP as tolerated -will perform SAT/SBT when respiratory parameters are met -VAP/VENT bundle implementation - patient may have superimposed secondary bacterial pneumonia post COVID19, currently on zyvox per ID - appreciate input.  No signs of rigidity on examination with low suspicion for serotonin syndrome, patient is conversive.     ACUTE DAISTOLIC CARDIAC FAILURE- EF -oxygen as needed -Lasix as tolerated    -will hold coreg today.  Morbid obesity, possible OSA.   Will certainly impact respiratory mechanics, ventilator weaning Suspect will need to consider additional PEEP    NEUROLOGY More alert and awake  SHOCK-SEPSIS -use vasopressors to keep MAP>65 as needed -follow ABG and  LA -follow up cultures -emperic ABX  CARDIAC ICU monitoring  ID-FUO -continue IV abx as prescibed -follow up cultures CT abd pelvis, CT chest and CT spine and [place] LE PENDING   GI GI PROPHYLAXIS as indicated  DIET-->TF's as tolerated Constipation protocol as indicated  ENDO - will use ICU hypoglycemic\Hyperglycemia protocol if indicated     ELECTROLYTES -follow labs as needed -replace as needed -pharmacy consultation and following   DVT/GI PRX ordered and assessed TRANSFUSIONS AS NEEDED MONITOR FSBS I Assessed the need for Labs I Assessed the need for Foley I Assessed the need for Central Venous Line Family Discussion when available I Assessed the need for Mobilization I made an Assessment of medications to be adjusted accordingly Safety Risk assessment completed   CASE DISCUSSED IN MULTIDISCIPLINARY ROUNDS WITH ICU TEAM  Critical Care Time devoted to patient care services described in this note is 32 minutes.   Overall, patient is critically ill, prognosis is guarded.  Patient with Multiorgan failure and at high risk for cardiac arrest and death.     Ottie Glazier, M.D.  Pulmonary & [place]

## 2020-09-24 NOTE — TOC Progression Note (Signed)
Transition of Care [facility]) - Progression Note    Patient Details  Name: Craig [name] MRN: [id number] Date of Birth: Aug 28, 1990  Transition of Care [facility]) CM/SW Contact  Izola Price, RN Phone Number: 09/24/2020, 10:35 AM  Clinical Narrative:   09/24/20 1030 am.  Patient to be discharged/transferred to [place], [place] today. MD and RN report info given via secure chat. Prior CM left arrangements and contact information: Ambulance person at [facility] confirmed bed offer - Room #327, MD Report to Dr. Conley Rolls Sie [phone] or [phone], RN report to [phone] or [phone].   Facesheet and Medical Necessity (corrected to [facility]) form printed to ICU printer and Unit RN and CN informed. [facility] contacted for transport to [place]. Spoke to May at [employer]. She will refer to unit that will contact ICU for any other details regarding transport issues.   Relayed information from flowsheets and progress notes regarding current stats on trach, vent, PICC, LOC, isolation/Covid status. [facility] will contact unit as well and confirm time needed for transport.   Contacted Rebecca CM at [facility] to confirm discharge status for today. She was awaiting discharge summary from provider. ([phone] Also gave her this [facility] contact number. No other information needed from TOC/CM at this time.   Communicated with Unit RN and she was to inform spouse at bedside. Breashears,[name] (Spouse) [phone]  Simmie Davies RN CM      Barriers to Discharge: Barriers Resolved  Expected Discharge Plan and Services                           DME Arranged: N/A DME Agency: NA       HH Arranged: NA HH Agency: NA         Social Determinants of Health (SDOH) Interventions    Readmission Risk Interventions No flowsheet data found.

## 2020-09-24 NOTE — Progress Notes (Signed)
Patient handoff and report given to Webb Silversmith, RN at [place] and to [place], EMT with [facility]. Patient disconnected from bedside monitor and ventilator and attached to transport monitor and ventilator. KVO and cont. TF stopped for transport. Scheduled insulin held to prevent hypoglycemia. Patient belongings packed and sent with [name], pts wife. Discharge packet given to [facility], Transporter.

## 2020-09-24 NOTE — TOC Transition Note (Signed)
Transition of Care [facility]) - CM/SW Discharge Note   Patient Details  Name: Craig [name] MRN: [id number] Date of Birth: Dec 24, 1990  [facility] CM/SW Contact:  Izola Price, RN Phone Number: 09/24/2020, 4:51 PM   Clinical Narrative:   Discharge summary accepted by CM at [place] via [place] and Drucie Opitz. ICU RN called [facility] and CM was notified via VM of impending transfer. Communications between [place] and Loss adjuster, chartered and CN took place as well. Simmie Davies RN CM      Final next level of care: Long Term Nursing Home Barriers to Discharge: Barriers Resolved   Patient Goals and CMS Choice        Discharge Placement                Patient to be transferred to facility by: [facility] (TO [facility] via [facility]) Name of family member notified: Donato Schultz (Spouse) [phone] (Previously discussed transfer date with spouse via CM notes.)    Discharge Plan and Services                DME Arranged: N/A DME Agency: NA       HH Arranged: NA [place] Agency: NA        Social Determinants of Health (SDOH) Interventions     Readmission Risk Interventions No flowsheet data found.

## 2020-09-24 NOTE — Progress Notes (Signed)
Patient became tired breathing on PSV, changed back to previous PRVC settings.

## 2020-09-24 NOTE — Progress Notes (Signed)
CRITICAL CARE NOTE 30 yo male recent hospitalization for perforated diverticulitis s/p colostomy with reversal and leakage of anastomosis with s/p revision now acutely hypoxemic with COVID19 induced severe ARDS.  INTERVAL EVENTS:  08/11/20-patient continued to decline with worsening respiratory status despite 100%Fio2 on BIPAP and HFNC failure. He stated that he feels he is dying and cannot breathe. I discussed with wife [name] earlier today that patient is critically ill may need ETT, she is agreeable and thankful for care. I specifically discussed and explained intubation and mechanical ventilation to patient and he wishes to proceed.  08/12/20- patient was weaned on FiO2 to 60%. Spoke to wife [name].  08/13/20- Patient weaned to 40%. During SBT today he self extubated and was placed on HFNC. He subsequently became hypoxemic, disoriented aggitated, removed PIVs and colostomy and proceeded to have combative behavior with worsening oxygenation. I have attempted to calm patient and encouraged him to cooperate. After numerous attempts patient continued to be combative with severe aggitation with combative behavior and would not wear oxygen. Patient was placed on sedation and MV. Wife updated.  08/14/20- patient remains critically ill. Weaned from 100%>>55%. Called and updated wife [name] today  12/27-Patient with worsening oxygenation on FiO2 100% peep of 14  08/16/20-Patient with continued high oxygen requirements overnight. Have spoken with surgery and there should be no problem proning patient with this. Was febrile yesterday and thus broad spectrum abx started and cultures attained.  08-17-20-patient down to PEEP 10 FiO2 55%. Patient never required proning  08/18/20: FiO2 weaned to 40%, PEEP 10, plan for diuresis with Diamox today, adding PO Clonazepam to assist weaning sedation  08/19/20: Vent changed to Pressure Control this morning: 50% FiO2, 24, 22/5, Plan for Recruitment maneuvers and diuresis   08/20/2020: Continued issues with hypercapnia due to the dead space ventilation. Worsening despite pressure control. Switch back to [facility], prone positioning  08/23/19- patient proned today without incident, continue to wean FiO2 , currently on 50%  08/24/19- patient weaned to 40% FiO2, plan to continue proning with full scope of care. I have spoken to [name] (wife of patient today) she would like to be present prior to next extubation attempt to help console patient and decrease his aggitation.  1/5 failed weaning trials, ver agitated , severe hypoxia, Wife at bedside witnessed failed SAT.  1/6 severe resp failure, hypoxia  1/7 proning for 16 hrs, severe ARDS  1/8 PICC line placed, Had a brief PEA arrest while prone. Proning discontinued  1/10 severe resp failure  1/11 severe ARDS, ENT consulted fro [facility]  1/12 severe resp failure  09/02/20- patient with no acute events. Spoke with [name] today answered questions and plan is for trache.  09/03/20- patient remains on MV. FiO2 down to 35%PRVC plan for trache.  09/04/20- patient with TG>1000 will switch off propofol but patient with very high sedation requirement, will discuss options with pharmD. Plan for trache. Met with wife [name] at bedside.  1/20 extubated, wife at bedside, very weak cough, encephalopathy  1/21 re-intubated, severe resp failure  1/23 needs [facility] for survival  1/24 needs trach, severe resp failure  1/25 s/p [facility]  1/26 s/p PEG tube  1/27 unable to do TEE today  1/28 TEE deferred; new anemia (possibly related to recent tracheostomy)  1/30 recurrent fever  1/31 remains on vent, failure to wean  2/1 remains febrile, severe agitation  2/2 started on pressors, high fevers, low h/H 09/23/2020- patient with anasarca this am, he is lucid conversive with mild aggitation from  wires/trache/colostomy/foley.  LTACH has been approved.  Secretions are thick from [place] for theraputic aspiration of trachobronchial tree and BAL  for micorbiology , reviewed plan with RN at bedside and wife [name].    09/24/2020- patient is improved, he is speaking through trache.  Microbiology including gram stain from bronchoscopy with BAL is thus far negative for ongoing infection which is reassuring.  I discussed case with ID Dr Ramon Dredge yesterday and patient is still appropriate for [employer] with initiation of PT/OT and continuing antimicrobials at this time.  His edema is improved. Plan to d/c to [facility] today.        BP (!) 149/77   Pulse (!) 129   Temp 99.4 F (37.4 C) (Axillary)   Resp (!) 28   Ht 6' 0.01" (1.829 m)   Wt 116.6 kg   SpO2 96%   BMI 34.86 kg/m    I/O last 3 completed shifts: In: 1025.5 [I.V.:650.4; Other:120; NG/GT:80; IV Piggyback:175.1] Out: 5700 [Urine:4850; Stool:850] Total I/O In: 399.2 [I.V.:10; NG/GT:240; IV Piggyback:149.2] Out: 1750 [Urine:1750]  SpO2: 96 % O2 Flow Rate (L/min): 0 L/min FiO2 (%): 40 %  Estimated body mass index is 34.86 kg/m as calculated from the following:   Height as of this encounter: 6' 0.01" (1.829 m).   Weight as of this encounter: 116.6 kg.  SIGNIFICANT EVENTS   REVIEW OF SYSTEMS  10 point ROS is done and is negative except as per subjective findings.   Pressure Injury 09/17/20 Coccyx Medial Stage 2 -  Partial thickness loss of dermis presenting as a shallow open injury with a red, pink wound bed without slough. (Active)  09/17/20 1010  Location: Coccyx  Location Orientation: Medial  Staging: Stage 2 -  Partial thickness loss of dermis presenting as a shallow open injury with a red, pink wound bed without slough.  Wound Description (Comments):   Present on Admission:       PHYSICAL EXAMINATION:  GENERAL:age appropriate , no distress this am EYES: Pupils equal, round, reactive to light.  No scleral icterus.  MOUTH: Moist mucosal membrane. NECK: Supple. +tracheostomy PULMONARY: +rhonchi and MV sounds in background CARDIOVASCULAR: S1 and S2.  Regular rate and rhythm. No murmurs, rubs, or gallops.  GASTROINTESTINAL: Soft, nontender, -distended.  Positive bowel sounds.  +colostomy MUSCULOSKELETAL: +edema.  NEUROLOGIC: more alert and awake SKIN:intact,warm,dry  MEDICATIONS: I have reviewed all medications and confirmed regimen as documented   CULTURE RESULTS   Recent Results (from the past 240 hour(s))  CULTURE, BLOOD (ROUTINE X 2) w Reflex to ID Panel     Status: None   Collection Time: 09/16/20  9:46 AM   Specimen: BLOOD LEFT HAND  Result Value Ref Range Status   Specimen Description BLOOD LEFT HAND  Final   Special Requests   Final    BOTTLES DRAWN AEROBIC ONLY Blood Culture results may not be optimal due to an inadequate volume of blood received in culture bottles   Culture   Final    NO GROWTH 5 DAYS Performed at [facility], [address] [place], [place] [address]    Report Status 09/21/2020 FINAL  Final  CULTURE, BLOOD (ROUTINE X 2) w Reflex to ID Panel     Status: None   Collection Time: 09/16/20  9:52 AM   Specimen: Joint, Finger; Blood  Result Value Ref Range Status   Specimen Description FINGER LEFT BLOOD  Final   Special Requests   Final    BOTTLES DRAWN AEROBIC AND  ANAEROBIC Blood Culture adequate volume   Culture   Final    NO GROWTH 5 DAYS Performed at [facility], [address] [place], [place] [address]    Report Status 09/21/2020 FINAL  Final  Culture, respiratory (non-expectorated)     Status: None   Collection Time: 09/16/20 12:20 PM   Specimen: Tracheal Aspirate; Respiratory  Result Value Ref Range Status   Specimen Description   Final    TRACHEAL ASPIRATE Performed at [facility], [address], [place], [place] [address]    Special Requests   Final    NONE Performed at [facility], [address] [place], [place] [address]    Gram Stain   Final    RARE WBC PRESENT,BOTH PMN AND MONONUCLEAR MODERATE GRAM POSITIVE COCCI IN  PAIRS Performed at [facility], [place] [address] [place], [place] [address]    Culture MODERATE STAPHYLOCOCCUS AUREUS  Final   Report Status 09/18/2020 FINAL  Final   Organism ID, Bacteria STAPHYLOCOCCUS AUREUS  Final      Susceptibility   Staphylococcus aureus - MIC*    CIPROFLOXACIN <=0.5 SENSITIVE Sensitive     ERYTHROMYCIN >=8 RESISTANT Resistant     GENTAMICIN <=0.5 SENSITIVE Sensitive     OXACILLIN 0.5 SENSITIVE Sensitive     TETRACYCLINE <=1 SENSITIVE Sensitive     VANCOMYCIN <=0.5 SENSITIVE Sensitive     TRIMETH/SULFA <=10 SENSITIVE Sensitive     CLINDAMYCIN RESISTANT Resistant     RIFAMPIN <=0.5 SENSITIVE Sensitive     Inducible Clindamycin POSITIVE Resistant     * MODERATE STAPHYLOCOCCUS AUREUS  Aerobic Culture (superficial specimen)     Status: None   Collection Time: 09/16/20  2:23 PM   Specimen: Skin, Cyst/Tag/Debridement; Wound  Result Value Ref Range Status   Specimen Description   Final    SKIN Performed at [facility], [address] [place], [place] [address]    Special Requests   Final    NONE Performed at [facility], [address] [place], [place] [address]    Gram Stain   Final    NO WBC SEEN MODERATE GRAM POSITIVE COCCI IN PAIRS FEW GRAM NEGATIVE RODS    Culture   Final    MODERATE STAPHYLOCOCCUS AUREUS WITHIN MIXED CULTURE Performed at [facility], [place] [address], [place], [place] [address]    Report Status 09/20/2020 FINAL  Final   Organism ID, Bacteria STAPHYLOCOCCUS AUREUS  Final      Susceptibility   Staphylococcus aureus - MIC*    CIPROFLOXACIN <=0.5 SENSITIVE Sensitive     ERYTHROMYCIN RESISTANT Resistant     GENTAMICIN <=0.5 SENSITIVE Sensitive     OXACILLIN 0.5 SENSITIVE Sensitive     TETRACYCLINE <=1 SENSITIVE Sensitive     VANCOMYCIN <=0.5 SENSITIVE Sensitive     TRIMETH/SULFA <=10 SENSITIVE Sensitive     CLINDAMYCIN RESISTANT Resistant     RIFAMPIN <=0.5 SENSITIVE Sensitive     Inducible  Clindamycin POSITIVE Resistant     * MODERATE STAPHYLOCOCCUS AUREUS  MRSA PCR Screening     Status: None   Collection Time: 09/18/20  4:50 AM   Specimen: Nasopharyngeal  Result Value Ref Range Status   MRSA by PCR NEGATIVE NEGATIVE Final    Comment:        The GeneXpert MRSA Assay (FDA approved for NASAL specimens only), is one component of a comprehensive MRSA colonization surveillance program. It is not intended to diagnose MRSA infection nor  to guide or monitor treatment for MRSA infections. Performed at [facility], [address] [place], [place] [address]   Culture, BAL-quantitative     Status: None (Preliminary result)   Collection Time: 09/23/20  3:00 PM   Specimen: Bronchoalveolar Lavage; Respiratory  Result Value Ref Range Status   Specimen Description   Final    BRONCHIAL ALVEOLAR LAVAGE Performed at [facility], [address], [place], [place] [address]    Special Requests   Final    Immunocompromised Performed at [facility], [address] [place], [place] [address]    Gram Stain   Final    FEW WBC PRESENT, PREDOMINANTLY MONONUCLEAR NO ORGANISMS SEEN Performed at [facility], [place] [address] [place], [place] [address]    Culture PENDING  Incomplete   Report Status PENDING  Incomplete          IMAGING    No results found.   Nutrition Status: Nutrition Problem: Increased nutrient needs Etiology: catabolic illness (BVQXI-50) Signs/Symptoms: estimated needs Interventions: Refer to RD note for recommendations           Central Line/ continued, requirement due to  Reason to continue [place] of central venous pressure or other hemodynamic parameters and poor IV access   Ventilator continued, requirement due to severe respiratory failure   Ventilator Sedation RASS 0 to -2       ASSESSMENT AND PLAN SYNOPSIS  30 yo male recent hospitalization for perforated diverticulitis s/p  colostomy with reversal and leakage of anastomosis with s/p revision now acutely hypoxemic with COVID19 induced severe ARDS.failure to wean from VENT-self extubation and trial of extubation failed due to Severe encephalopathy leading to inability to protect airway. Tracheostomy performed 1/25.PEG placed 1/26 Developed high fevers with septic shock in last 2 days on pressors   Severe ACUTE Hypoxic and Hypercapnic Respiratory Failure -continue Full MV support -continue Bronchodilator Therapy -Wean Fio2 and PEEP as tolerated -will perform SAT/SBT when respiratory parameters are met -VAP/VENT bundle implementation - patient may have superimposed secondary bacterial pneumonia post COVID19, currently on zyvox per ID - appreciate input.  No signs of rigidity on examination with low suspicion for serotonin syndrome, patient is conversive.     ACUTE DAISTOLIC CARDIAC FAILURE- EF -oxygen as needed -Lasix as tolerated    -will hold coreg today.  Morbid obesity, possible OSA.   Will certainly impact respiratory mechanics, ventilator weaning Suspect will need to consider additional PEEP    NEUROLOGY More alert and awake  SHOCK-SEPSIS -use vasopressors to keep MAP>65 as needed -follow ABG and LA -follow up cultures -emperic ABX  CARDIAC ICU monitoring  ID-FUO -continue IV abx as prescibed -follow up cultures CT abd pelvis, CT chest and CT spine and [place] LE PENDING   GI GI PROPHYLAXIS as indicated  DIET-->TF's as tolerated Constipation protocol as indicated  ENDO - will use ICU hypoglycemic\Hyperglycemia protocol if indicated     ELECTROLYTES -follow labs as needed -replace as needed -pharmacy consultation and following   DVT/GI PRX ordered and assessed TRANSFUSIONS AS NEEDED MONITOR FSBS I Assessed the need for Labs I Assessed the need for Foley I Assessed the need for Central Venous Line Family Discussion when available I Assessed the need for Mobilization I  made an Assessment of medications to be adjusted accordingly Safety Risk assessment completed   CASE DISCUSSED IN MULTIDISCIPLINARY ROUNDS WITH ICU TEAM  Critical Care Time devoted to patient care services described in this note is 36  minutes.   Overall, patient is critically ill, prognosis is guarded.  Patient with Multiorgan failure and at high risk for cardiac arrest and death.     Ottie Glazier, M.D.  Pulmonary & [place]

## 2020-09-25 DIAGNOSIS — J189 Pneumonia, unspecified organism: Secondary | ICD-10-CM

## 2020-09-25 DIAGNOSIS — R6521 Severe sepsis with septic shock: Secondary | ICD-10-CM

## 2020-09-25 DIAGNOSIS — G9341 Metabolic encephalopathy: Secondary | ICD-10-CM

## 2020-09-25 DIAGNOSIS — I5031 Acute diastolic (congestive) heart failure: Secondary | ICD-10-CM

## 2020-09-25 DIAGNOSIS — J9621 Acute and chronic respiratory failure with hypoxia: Secondary | ICD-10-CM

## 2020-09-26 LAB — CULTURE, BAL-QUANTITATIVE W GRAM STAIN: Culture: 50000 — AB

## 2020-09-27 LAB — ACID FAST SMEAR (AFB, MYCOBACTERIA): Acid Fast Smear: NEGATIVE

## 2020-09-27 LAB — ASPERGILLUS ANTIGEN, BAL/SERUM: Aspergillus Ag, BAL/Serum: 0.05 Index (ref 0.00–0.49)

## 2020-10-03 DIAGNOSIS — J9621 Acute and chronic respiratory failure with hypoxia: Secondary | ICD-10-CM

## 2020-10-03 DIAGNOSIS — G9341 Metabolic encephalopathy: Secondary | ICD-10-CM

## 2020-10-03 DIAGNOSIS — R6521 Severe sepsis with septic shock: Secondary | ICD-10-CM

## 2020-10-03 DIAGNOSIS — J189 Pneumonia, unspecified organism: Secondary | ICD-10-CM

## 2020-10-03 DIAGNOSIS — I5031 Acute diastolic (congestive) heart failure: Secondary | ICD-10-CM

## 2020-10-04 DIAGNOSIS — R6521 Severe sepsis with septic shock: Secondary | ICD-10-CM

## 2020-10-04 DIAGNOSIS — I5031 Acute diastolic (congestive) heart failure: Secondary | ICD-10-CM

## 2020-10-04 DIAGNOSIS — G9341 Metabolic encephalopathy: Secondary | ICD-10-CM

## 2020-10-04 DIAGNOSIS — J9621 Acute and chronic respiratory failure with hypoxia: Secondary | ICD-10-CM

## 2020-10-04 DIAGNOSIS — J189 Pneumonia, unspecified organism: Secondary | ICD-10-CM

## 2020-10-04 LAB — PNEUMOCYSTIS PCR

## 2020-10-05 DIAGNOSIS — I5031 Acute diastolic (congestive) heart failure: Secondary | ICD-10-CM

## 2020-10-05 DIAGNOSIS — J189 Pneumonia, unspecified organism: Secondary | ICD-10-CM

## 2020-10-05 DIAGNOSIS — G9341 Metabolic encephalopathy: Secondary | ICD-10-CM

## 2020-10-05 DIAGNOSIS — R6521 Severe sepsis with septic shock: Secondary | ICD-10-CM

## 2020-10-05 DIAGNOSIS — J9621 Acute and chronic respiratory failure with hypoxia: Secondary | ICD-10-CM

## 2020-10-05 LAB — BLOOD GAS, ARTERIAL
Acid-Base Excess: 3.8 mmol/L — ABNORMAL HIGH (ref 0.0–2.0)
Bicarbonate: 31.7 mmol/L — ABNORMAL HIGH (ref 20.0–28.0)
FIO2: 0.7
MECHVT: 500 mL
O2 Saturation: 85 %
PEEP: 14 cmH2O
Patient temperature: 37
RATE: 20 resp/min
pCO2 arterial: 63 mmHg — ABNORMAL HIGH (ref 32.0–48.0)
pH, Arterial: 7.31 — ABNORMAL LOW (ref 7.350–7.450)
pO2, Arterial: 55 mmHg — ABNORMAL LOW (ref 83.0–108.0)

## 2020-10-06 DIAGNOSIS — I5031 Acute diastolic (congestive) heart failure: Secondary | ICD-10-CM

## 2020-10-06 DIAGNOSIS — G9341 Metabolic encephalopathy: Secondary | ICD-10-CM

## 2020-10-06 DIAGNOSIS — R6521 Severe sepsis with septic shock: Secondary | ICD-10-CM

## 2020-10-06 DIAGNOSIS — J9621 Acute and chronic respiratory failure with hypoxia: Secondary | ICD-10-CM

## 2020-10-07 DIAGNOSIS — G9341 Metabolic encephalopathy: Secondary | ICD-10-CM

## 2020-10-07 DIAGNOSIS — R6521 Severe sepsis with septic shock: Secondary | ICD-10-CM

## 2020-10-07 DIAGNOSIS — I5031 Acute diastolic (congestive) heart failure: Secondary | ICD-10-CM

## 2020-10-07 DIAGNOSIS — J9621 Acute and chronic respiratory failure with hypoxia: Secondary | ICD-10-CM

## 2020-10-07 DIAGNOSIS — J189 Pneumonia, unspecified organism: Secondary | ICD-10-CM

## 2020-10-08 DIAGNOSIS — G9341 Metabolic encephalopathy: Secondary | ICD-10-CM

## 2020-10-08 DIAGNOSIS — J9621 Acute and chronic respiratory failure with hypoxia: Secondary | ICD-10-CM

## 2020-10-08 DIAGNOSIS — J189 Pneumonia, unspecified organism: Secondary | ICD-10-CM

## 2020-10-08 DIAGNOSIS — R6521 Severe sepsis with septic shock: Secondary | ICD-10-CM

## 2020-10-08 DIAGNOSIS — I5031 Acute diastolic (congestive) heart failure: Secondary | ICD-10-CM

## 2020-10-09 DIAGNOSIS — J9621 Acute and chronic respiratory failure with hypoxia: Secondary | ICD-10-CM

## 2020-10-09 DIAGNOSIS — I5031 Acute diastolic (congestive) heart failure: Secondary | ICD-10-CM

## 2020-10-09 DIAGNOSIS — R6521 Severe sepsis with septic shock: Secondary | ICD-10-CM

## 2020-10-09 DIAGNOSIS — G9341 Metabolic encephalopathy: Secondary | ICD-10-CM

## 2020-10-09 DIAGNOSIS — J189 Pneumonia, unspecified organism: Secondary | ICD-10-CM

## 2020-10-20 DIAGNOSIS — M792 Neuralgia and neuritis, unspecified: Secondary | ICD-10-CM | POA: Insufficient documentation

## 2020-10-28 ENCOUNTER — Emergency Department: Payer: No Typology Code available for payment source

## 2020-10-28 ENCOUNTER — Other Ambulatory Visit: Payer: Self-pay

## 2020-10-28 ENCOUNTER — Emergency Department
Admission: EM | Admit: 2020-10-28 | Discharge: 2020-10-28 | Disposition: A | Discharge status: Home / Self Care | Payer: No Typology Code available for payment source | Attending: Emergency Medicine | Admitting: Emergency Medicine

## 2020-10-28 ENCOUNTER — Encounter: Payer: Self-pay | Admitting: Emergency Medicine

## 2020-10-28 DIAGNOSIS — S0990XA Unspecified injury of head, initial encounter: Secondary | ICD-10-CM

## 2020-10-28 DIAGNOSIS — Z79899 Other long term (current) drug therapy: Secondary | ICD-10-CM | POA: Diagnosis not present

## 2020-10-28 DIAGNOSIS — S161XXA Strain of muscle, fascia and tendon at neck level, initial encounter: Secondary | ICD-10-CM | POA: Diagnosis not present

## 2020-10-28 DIAGNOSIS — I10 Essential (primary) hypertension: Secondary | ICD-10-CM | POA: Diagnosis not present

## 2020-10-28 DIAGNOSIS — W228XXA Striking against or struck by other objects, initial encounter: Secondary | ICD-10-CM | POA: Insufficient documentation

## 2020-10-28 DIAGNOSIS — S199XXA Unspecified injury of neck, initial encounter: Secondary | ICD-10-CM | POA: Diagnosis present

## 2020-10-28 DIAGNOSIS — Z8616 Personal history of COVID-19: Secondary | ICD-10-CM | POA: Diagnosis not present

## 2020-10-28 IMAGING — CT CT HEAD W/O CM
4 series · 15 of 47 positions shown, 17 images · non-contrast
Comparison: CT C-spine [DATE] and MRI brain [DATE].

CLINICAL DATA: Fall with head trauma

EXAM:
CT HEAD WITHOUT CONTRAST
CT CERVICAL SPINE WITHOUT CONTRAST
TECHNIQUE: Multidetector CT imaging of the head and cervical spine was
performed following the standard protocol without intravenous
contrast. Multiplanar CT image reconstructions of the cervical spine
were also generated.

[Series 2: head wo · axial · 0.42mm/px · z∈[+342,+452]mm · 7 of 30 slices shown, 9 images]
[im 4/30  brain]
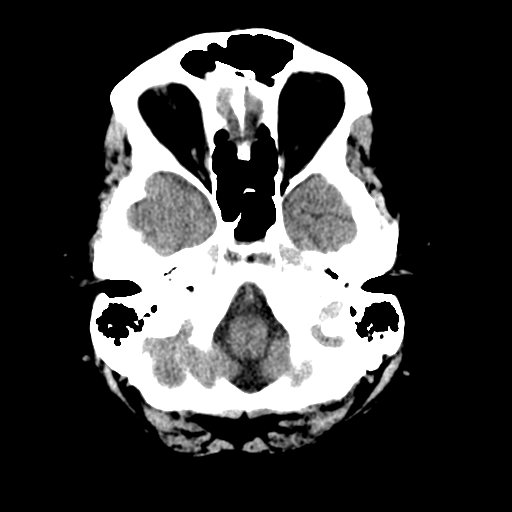
[im 4/30  bone]
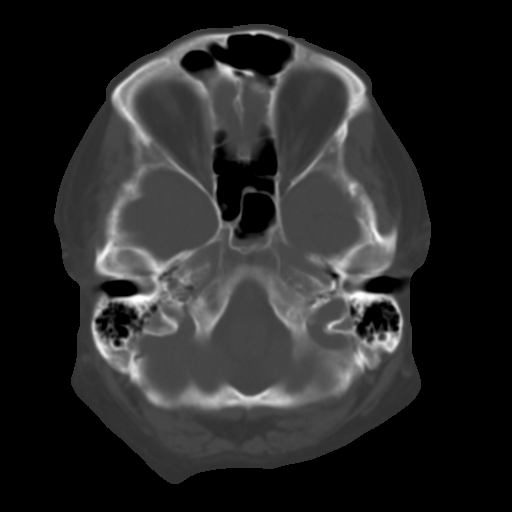
[im 8/30  brain]
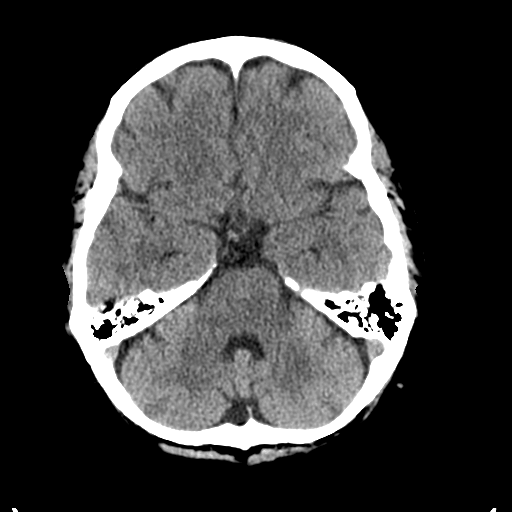
[im 11/30  brain]
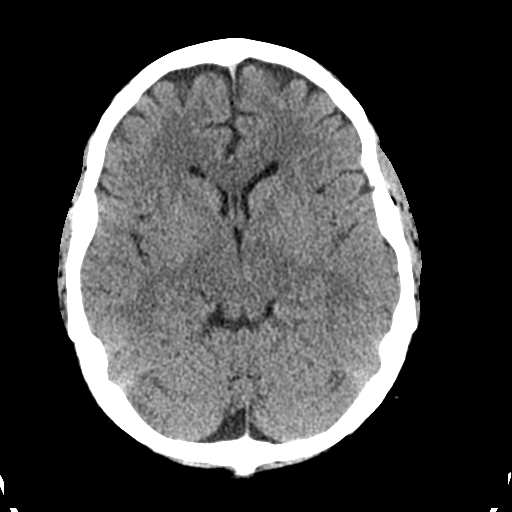
[im 15/30  brain]
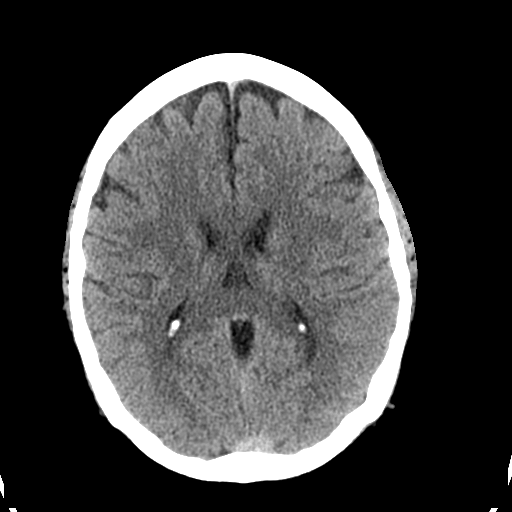
[im 19/30  brain]
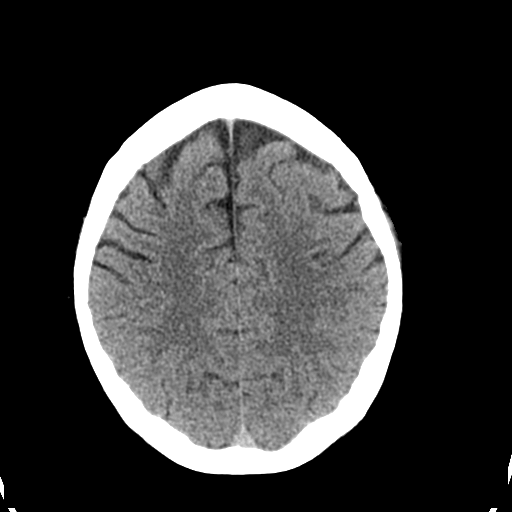
[im 19/30  bone]
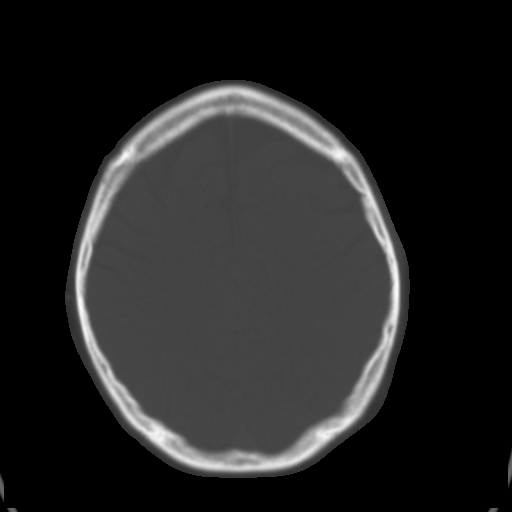
[im 22/30  brain]
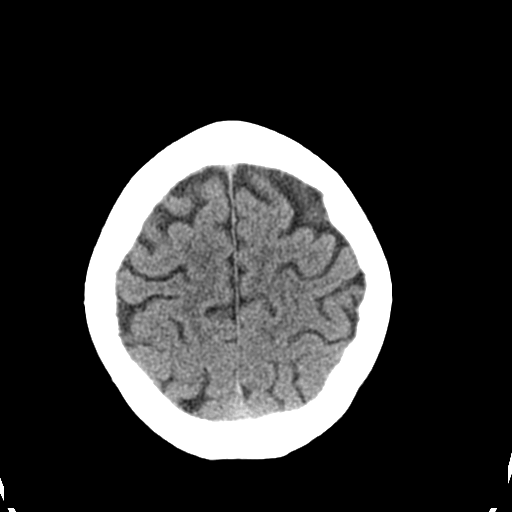
[im 26/30  brain]
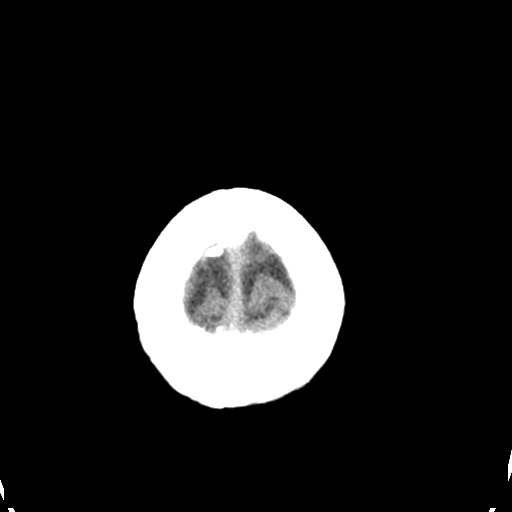

[Series 3: head bone · axial · 0.42mm/px · z∈[+341,+355]mm · 2 of 75 slices shown]
[im 8/75  bone]
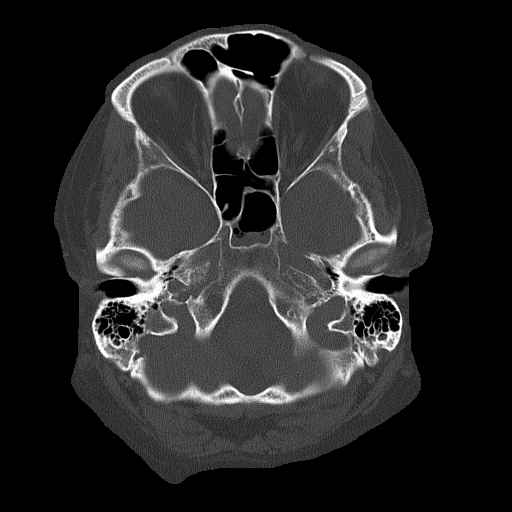
[im 15/75  bone]
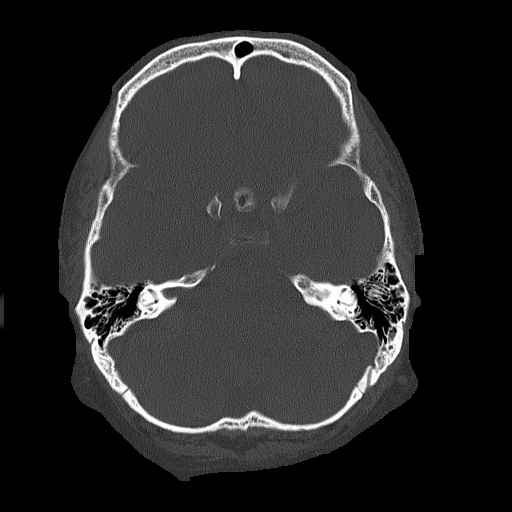

[Series 4: coronal soft tissue · coronal · 0.28mm/px · 3 of 69 slices shown]
[im 23/69  brain]
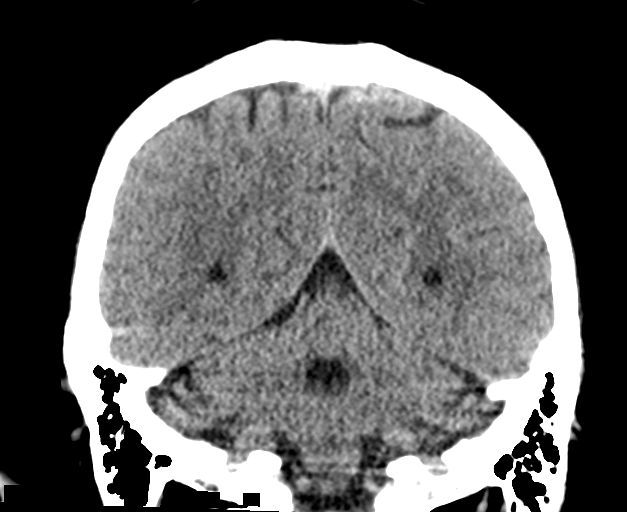
[im 31/69  brain]
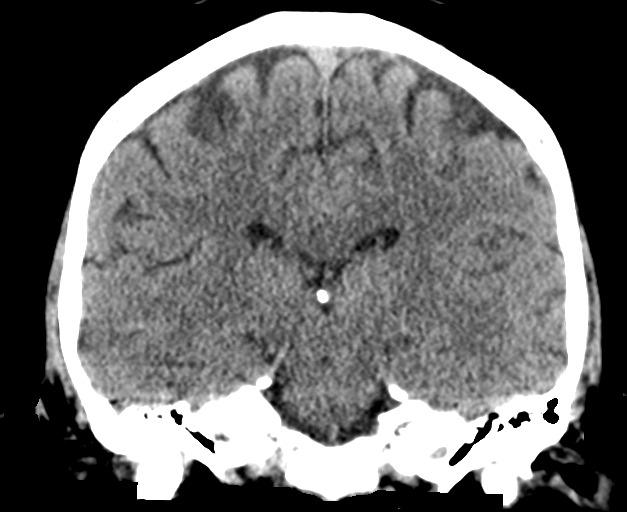
[im 38/69  brain]
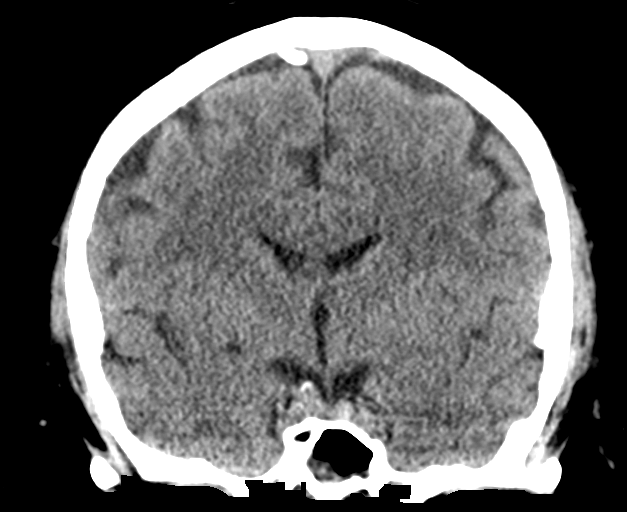

[Series 5: sagittal soft tissue · sagittal · 0.29mm/px · 3 of 61 slices shown]
[im 21/61  brain]
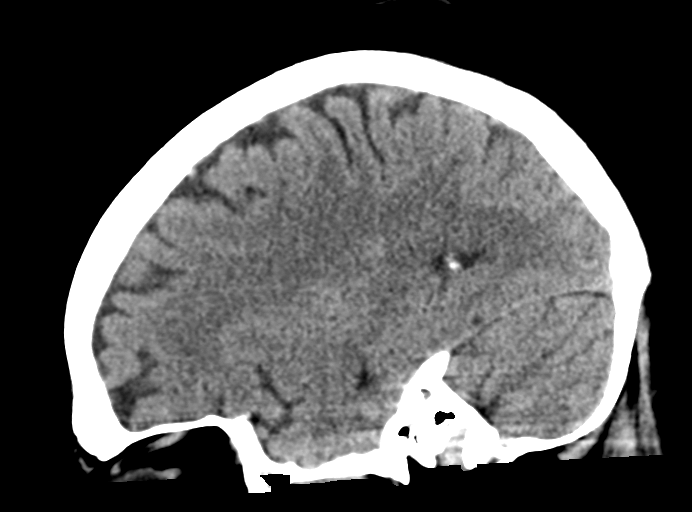
[im 31/61  brain]
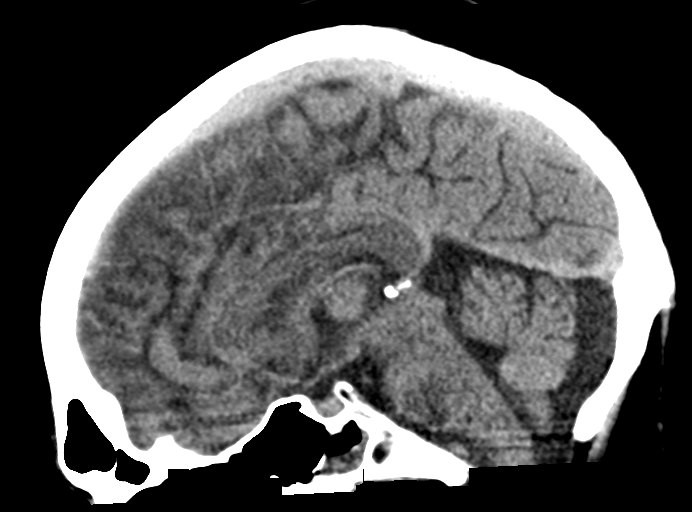
[im 41/61  brain]
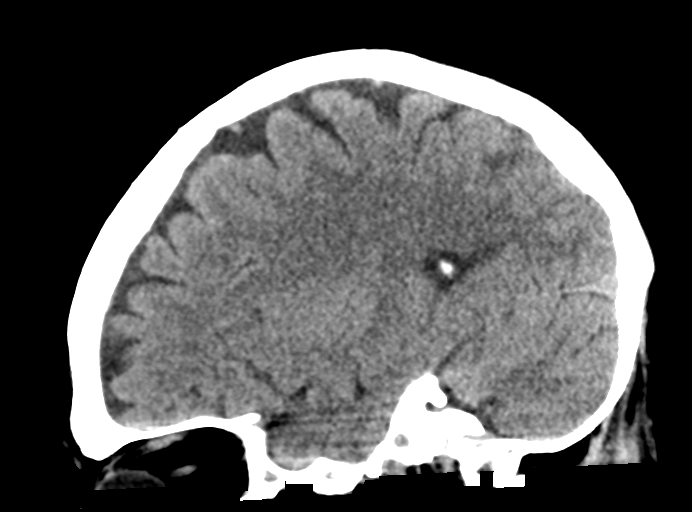

[15 of 47 positions shown; findings below may reference images not displayed]

FINDINGS: CT HEAD FINDINGS

Brain: No evidence of acute infarction, hemorrhage, hydrocephalus,
extra-axial collection or mass lesion/mass effect.

Vascular: No hyperdense vessel or unexpected calcification.

Skull: Normal. Negative for fracture or focal lesion.

Sinuses/Orbits: Scattered mucosal thickening of the ethmoid air
cells and paranasal sinuses. Mastoid air cells are clear.

Other: None

CT CERVICAL SPINE FINDINGS

Alignment: Normal.

Skull base and vertebrae: No acute fracture. No primary bone lesion
or focal pathologic process.

Soft tissues and spinal canal: No prevertebral fluid or swelling. No
visible canal hematoma.

Disc levels:  Preserved

Upper chest: Partially visualized right upper lobe opacity.

Other: None
IMPRESSION: 1. No acute intracranial pathology.
2. No evidence of acute fracture or subluxation of the cervical
spine.
3. Partially visualized right upper lobe opacity, recommend
dedicated AP and lateral chest radiograph for further evaluation.

## 2020-10-28 IMAGING — CT CT CERVICAL SPINE W/O CM
3 of 4 series · 13 of 33 positions shown, 16 images · non-contrast
Comparison: CT C-spine [DATE] and MRI brain [DATE].

CLINICAL DATA: Fall with head trauma

EXAM:
CT HEAD WITHOUT CONTRAST
CT CERVICAL SPINE WITHOUT CONTRAST
TECHNIQUE: Multidetector CT imaging of the head and cervical spine was
performed following the standard protocol without intravenous
contrast. Multiplanar CT image reconstructions of the cervical spine
were also generated.

[Series 6: sagittal bone · sagittal · 0.25mm/px · 5 of 66 slices shown, 6 images]
[im 22/66  bone]
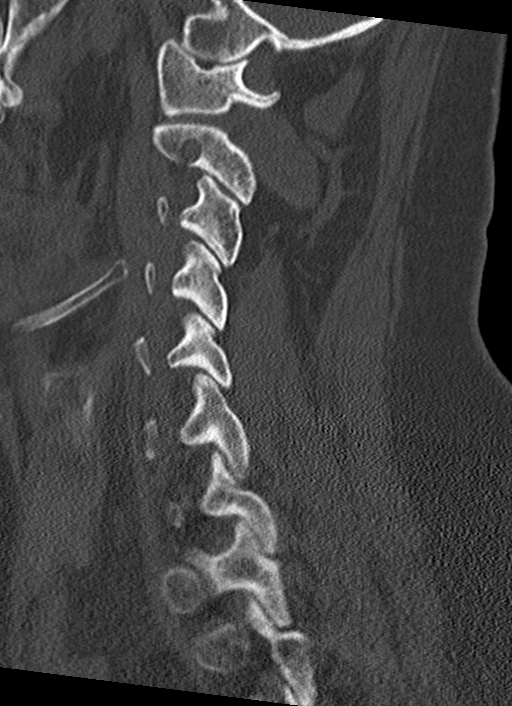
[im 28/66  bone]
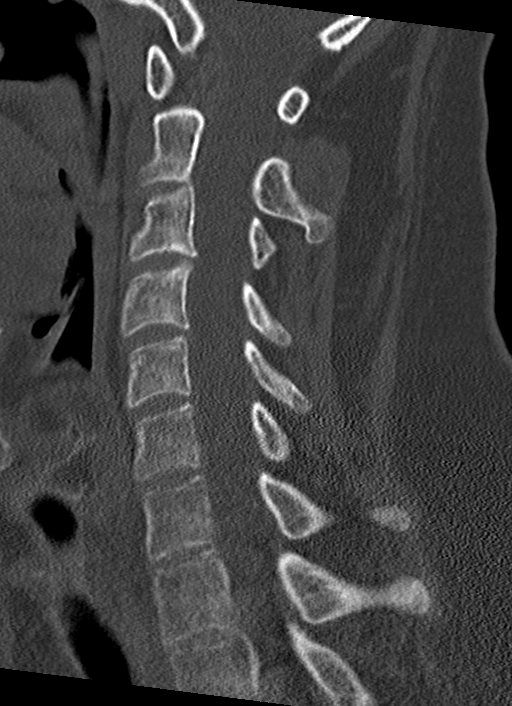
[im 33/66  soft-tissue]
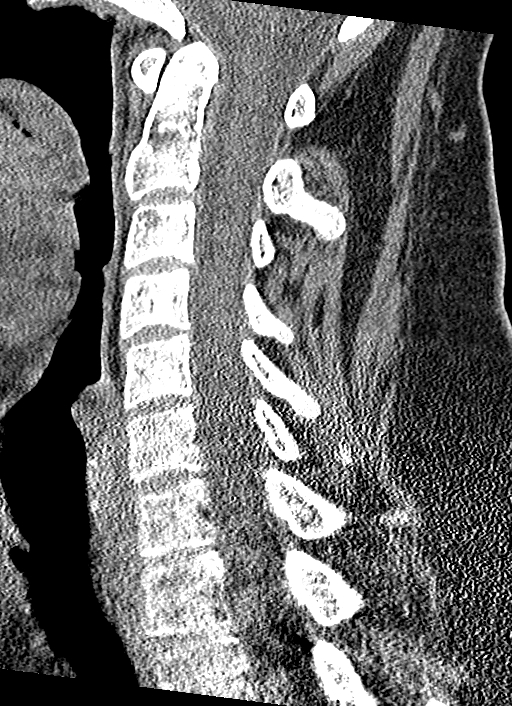
[im 33/66  bone]
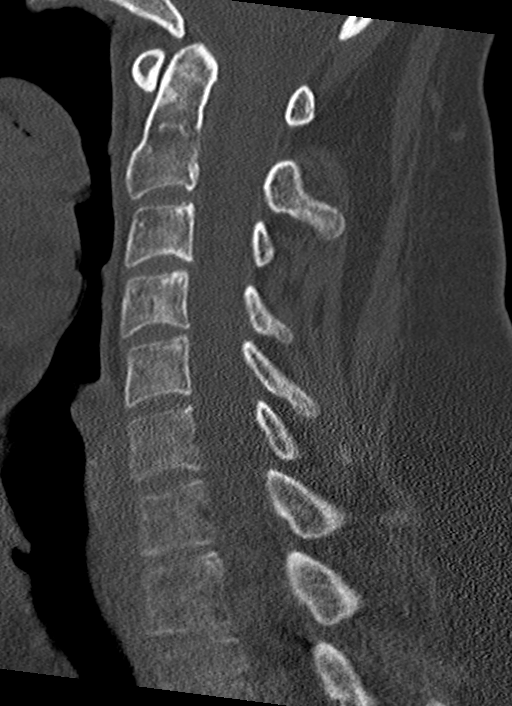
[im 38/66  bone]
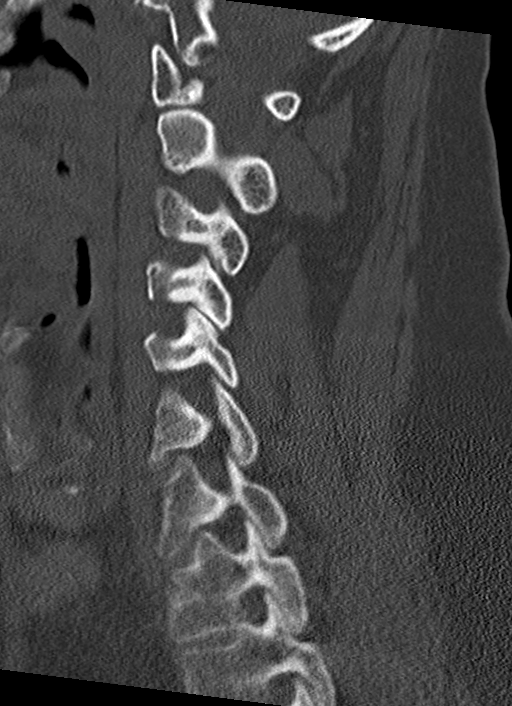
[im 44/66  bone]
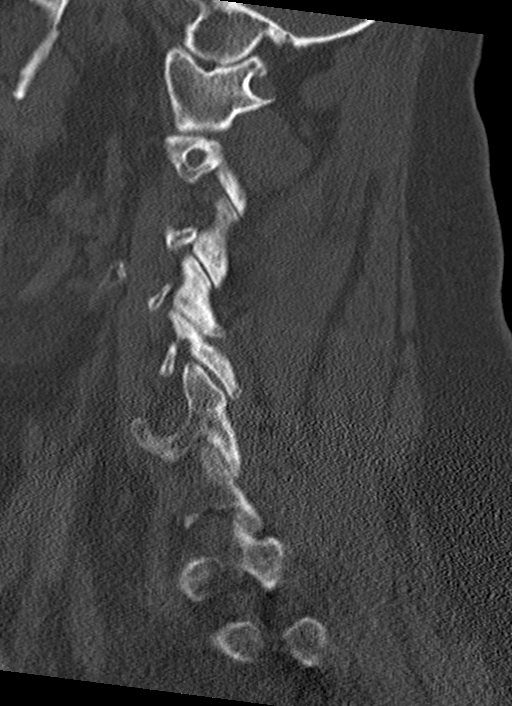

[Series 7: coronal bone · coronal · 0.25mm/px · 3 of 65 slices shown]
[im 13/65  bone]
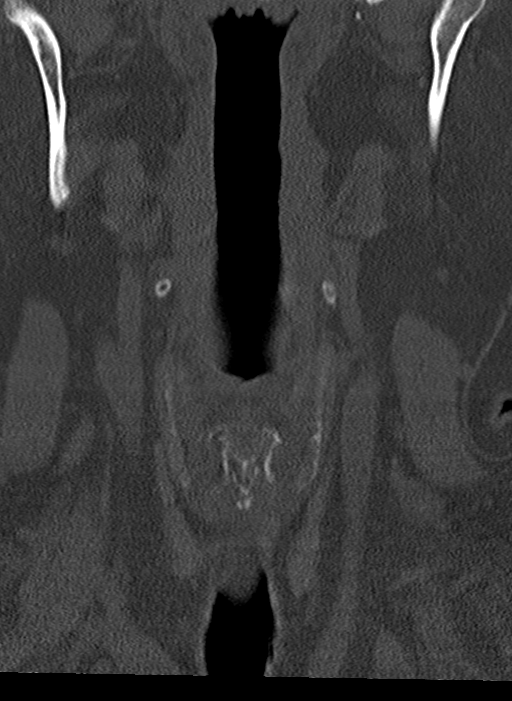
[im 26/65  bone]
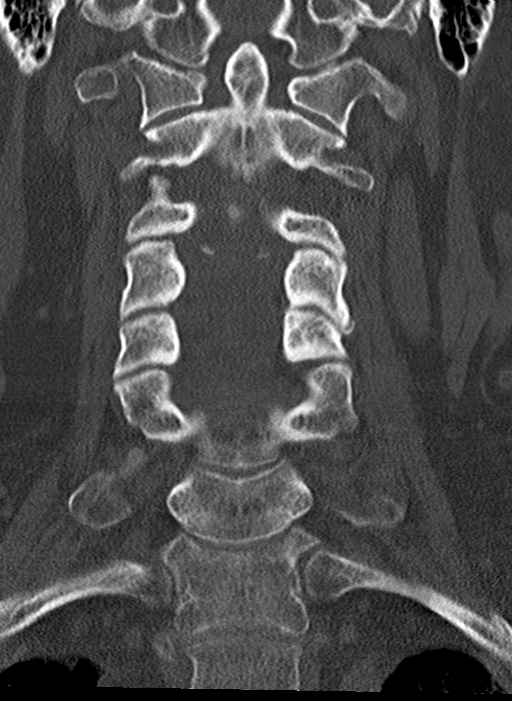
[im 39/65  bone]
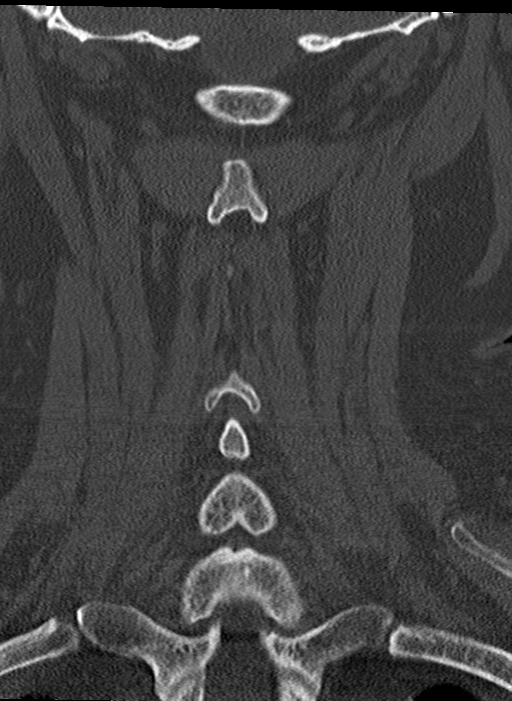

[Series 8: orthogonal bone · axial · 0.25mm/px · z∈[+159,+278]mm · 5 of 90 slices shown, 7 images]
[im 15/90  soft-tissue]
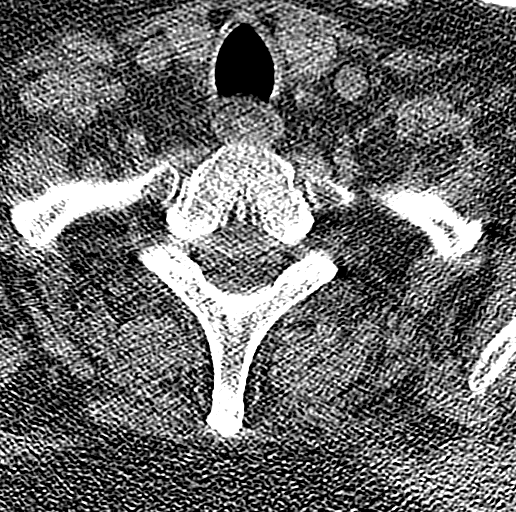
[im 15/90  bone]
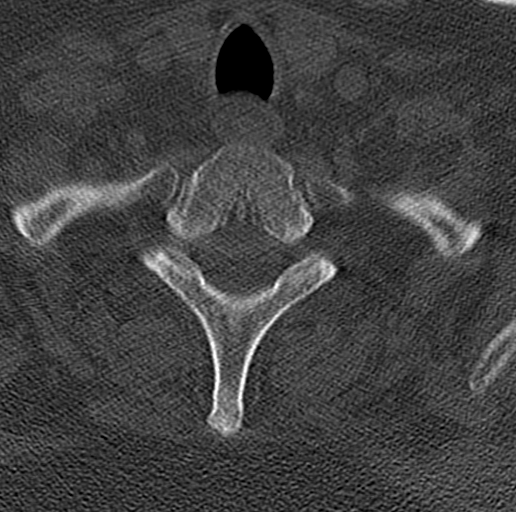
[im 30/90  bone]
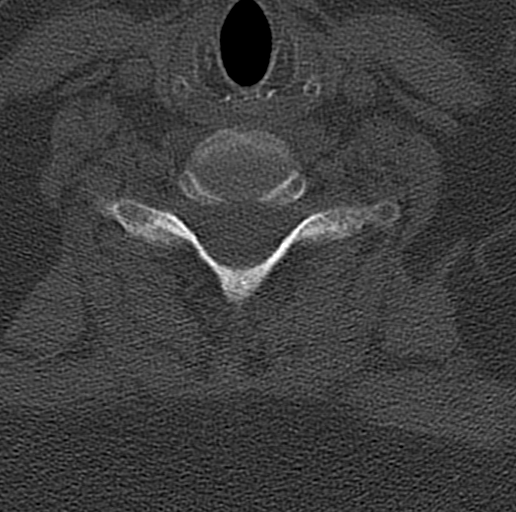
[im 45/90  bone]
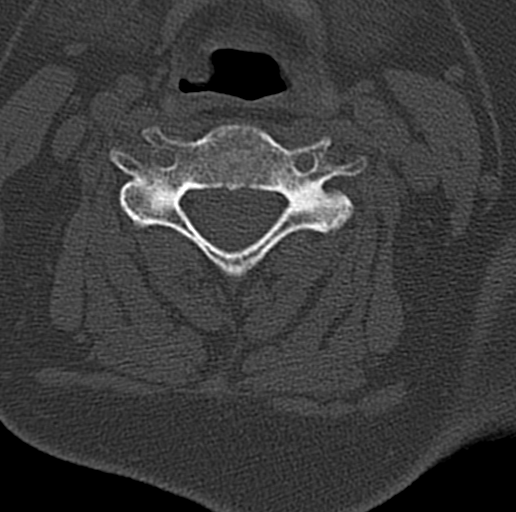
[im 60/90  bone]
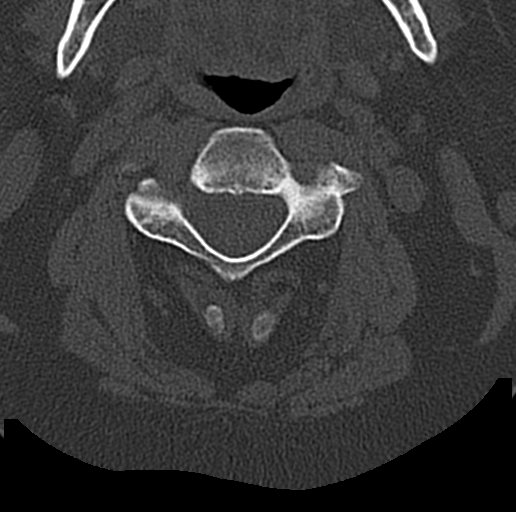
[im 75/90  soft-tissue]
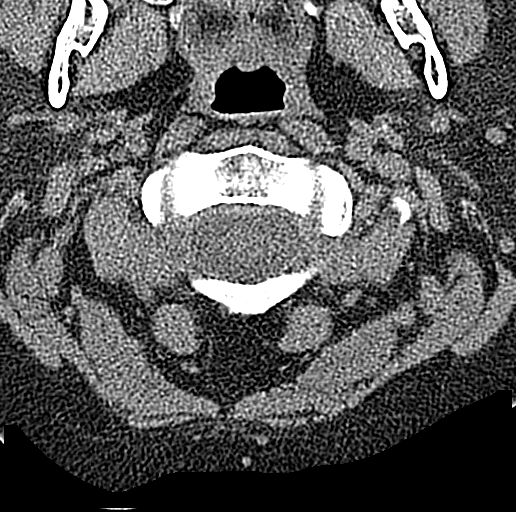
[im 75/90  bone]
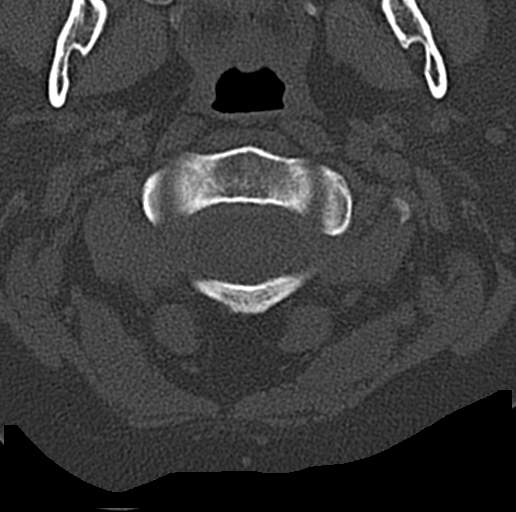

[13 of 33 positions shown; findings below may reference images not displayed]

FINDINGS: CT HEAD FINDINGS

Brain: No evidence of acute infarction, hemorrhage, hydrocephalus,
extra-axial collection or mass lesion/mass effect.

Vascular: No hyperdense vessel or unexpected calcification.

Skull: Normal. Negative for fracture or focal lesion.

Sinuses/Orbits: Scattered mucosal thickening of the ethmoid air
cells and paranasal sinuses. Mastoid air cells are clear.

Other: None

CT CERVICAL SPINE FINDINGS

Alignment: Normal.

Skull base and vertebrae: No acute fracture. No primary bone lesion
or focal pathologic process.

Soft tissues and spinal canal: No prevertebral fluid or swelling. No
visible canal hematoma.

Disc levels:  Preserved

Upper chest: Partially visualized right upper lobe opacity.

Other: None
IMPRESSION: 1. No acute intracranial pathology.
2. No evidence of acute fracture or subluxation of the cervical
spine.
3. Partially visualized right upper lobe opacity, recommend
dedicated AP and lateral chest radiograph for further evaluation.

## 2020-10-28 NOTE — ED Provider Notes (Signed)
[facility] Emergency Department Provider Note  ____________________________________________   Event Date/Time   First MD Initiated Contact with Patient 10/28/20 1125     (approximate)  I have reviewed the triage vital signs and the nursing notes.   HISTORY  Chief Complaint Head Injury    HPI Craig [name] is a 30 y.o. male presents emergency department via EMS.  Patient was being transported by EMS for a doctor's appointment.  While they were moving him on the stretcher the stretcher tipped and he hit his head.  Patient is on Xarelto.  No LOC.  Patient has multiple medical problems which are sequela of Covid.  See past medical history.    Past Medical History:  Diagnosis Date  . Gout   . Hypertension   . Rupture of bowel [facility])     Patient Active Problem List   Diagnosis Date Noted  . Pressure injury of skin 09/18/2020  . Staphylococcus aureus bacteremia   . Fever   . Metabolic encephalopathy   . Acute respiratory failure with hypoxia ([place])   . Acute hypoxemic respiratory failure due to COVID-19 ([place]) 08/09/2020  . Severe sepsis ([place]) 08/09/2020  . AKI (acute kidney injury) ([place]) 08/09/2020  . Hypertension   . 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 Pneumoperitoneum 07/08/2020  . 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 Diverticulitis of colon with perforation 11/22/2019    Past Surgical History:  Procedure Laterality Date  . COLONOSCOPY WITH PROPOFOL N/A 05/18/2020   Procedure: COLONOSCOPY WITH PROPOFOL;  Surgeon: Benjamine Sprague, DO;  Location: [facility] ENDOSCOPY;  Service: General;  Laterality: N/A;  . COLOSTOMY N/A 11/22/2019   Procedure: COLOSTOMY;  Surgeon: Herbert Pun, MD;  Location: [facility] ORS;  Service: General;  Laterality: N/A;  . LAPAROTOMY N/A 11/22/2019   Procedure: EXPLORATORY LAPAROTOMY;  Surgeon: Herbert Pun, MD;  Location: [facility] ORS;  Service: General;  Laterality: N/A;  .  LAPAROTOMY N/A 07/08/2020   Procedure: EXPLORATORY LAPAROTOMY;  Surgeon: Herbert Pun, MD;  Location: [facility] ORS;  Service: General;  Laterality: N/A;  . PARTIAL COLECTOMY N/A 11/22/2019   Procedure: PARTIAL COLECTOMY;  Surgeon: Herbert Pun, MD;  Location: [facility] ORS;  Service: General;  Laterality: N/A;  . PEG PLACEMENT N/A 09/14/2020   Procedure: PERCUTANEOUS ENDOSCOPIC GASTROSTOMY (PEG) PLACEMENT;  Surgeon: Benjamine Sprague, DO;  Location: [facility] ENDOSCOPY;  Service: General;  Laterality: N/A;  TRAVEL CASE [place]  . TRACHEOSTOMY TUBE PLACEMENT N/A 09/13/2020   Procedure: TRACHEOSTOMY;  Surgeon: Beverly Gust, MD;  Location: [facility] ORS;  Service: ENT;  Laterality: N/A;  . XI ROBOTIC ASSISTED COLOSTOMY TAKEDOWN N/A 06/28/2020   Procedure: XI ROBOTIC ASSISTED COLOSTOMY TAKEDOWN CONVERTED TO OPEN PROCEDURE;  Surgeon: Herbert Pun, MD;  Location: [facility] ORS;  Service: General;  Laterality: N/A;    Prior to Admission medications   Medication Sig Start Date End Date Taking? Authorizing Provider  clonazePAM (KLONOPIN) 1 MG tablet Place 1 tablet (1 mg total) into feeding tube 2 (two) times daily. 09/24/20   Ottie Glazier, MD  clotrimazole-betamethasone (LOTRISONE) cream Apply topically 2 (two) times daily. 09/24/20   Ottie Glazier, MD  doxazosin (CARDURA) 1 MG tablet Place 1 tablet (1 mg total) into feeding tube daily. 09/25/20   Ottie Glazier, MD  fentaNYL (DURAGESIC) 75 MCG/HR Place 1 patch onto the skin every 3 (three) days. 09/27/20   Ottie Glazier, MD  furosemide (LASIX) 10 MG/ML injection Inject 4 mLs (40 mg total) into the vein daily. 09/25/20   Aleskerov,  Fuad, MD  hydrocortisone sodium succinate (SOLU-CORTEF) 100 MG SOLR injection Inject 1 mL (50 mg total) into the vein every 12 (twelve) hours. 09/24/20   Ottie Glazier, MD  LORazepam (ATIVAN) 2 MG/ML injection Inject 1 mL (2 mg total) into the vein every 4 (four) hours as needed for sedation. 09/24/20   Ottie Glazier,  MD  metoprolol tartrate (LOPRESSOR) 5 MG/5ML SOLN injection Inject 5 mLs (5 mg total) into the vein every 6 (six) hours as needed (sustained HR > 125). 09/24/20   Ottie Glazier, MD  Nutritional Supplements (FEEDING SUPPLEMENT, PROSOURCE TF,) liquid Place 45 mLs into feeding tube 3 (three) times daily. 09/24/20   Ottie Glazier, MD  Nutritional Supplements (FEEDING SUPPLEMENT, VITAL 1.5 CAL,) LIQD Place 1,000 mLs into feeding tube continuous. 09/24/20   Ottie Glazier, MD  piperacillin-tazobactam (ZOSYN) 3.375 GM/50ML IVPB Inject 50 mLs (3.375 g total) into the vein every 8 (eight) hours. 09/24/20   Ottie Glazier, MD    Allergies Amoxicillin  Family History  Problem Relation Age of Onset  . Healthy Mother   . Healthy Father     Social History Social History   Tobacco Use  . Smoking status: Never Smoker  . Smokeless tobacco: Current User    Types: Chew  Vaping Use  . Vaping Use: Never used  Substance Use Topics  . Alcohol use: Yes    Comment: pint to a fifth per day per patient . Patient reports he hasn't had anything to drink in three weeks.  . Drug use: Never    Review of Systems  Constitutional: No fever/chills Eyes: No visual changes. ENT: No sore throat. Respiratory: Denies cough Cardiovascular: Denies chest pain Gastrointestinal: Denies abdominal pain Genitourinary: Negative for dysuria. Musculoskeletal: Negative for back pain. Skin: Negative for rash. Psychiatric: no mood changes,     ____________________________________________   PHYSICAL EXAM:  VITAL SIGNS: ED Triage Vitals  Enc Vitals Group     BP 10/28/20 1137 (!) 143/98     Pulse Rate 10/28/20 1137 (!) 128     Resp 10/28/20 1137 18     Temp 10/28/20 1137 98 F (36.7 C)     Temp Source 10/28/20 1137 Oral     SpO2 10/28/20 1137 94 %     Weight 10/28/20 1130 248 lb (112.5 kg)     Height 10/28/20 1130 6\' 1"  (1.854 m)     Head Circumference --      Peak Flow --      Pain Score 10/28/20 1134 4      Pain Loc --      Pain Edu? --      Excl. in [place]? --     Constitutional: Alert and oriented. Well appearing and in no acute distress. Eyes: Conjunctivae are normal.  Head: Abrasion left side of the forehead, left side of skull is a little tender Nose: No congestion/rhinnorhea. Mouth/Throat: Mucous membranes are moist.   Neck:  supple no lymphadenopathy noted Cardiovascular: Normal rate, regular rhythm. Respiratory: Normal respiratory effort.  No retractions,  GU: deferred Musculoskeletal: Patient baseline cannot move his arms completely, has foot drop, neurovascular intact  neurologic:  Normal speech and language.  Skin:  Skin is warm, dry and intact. No rash noted. Psychiatric: Mood and affect are normal. Speech and behavior are normal.  ____________________________________________   LABS (all labs ordered are listed, but only abnormal results are displayed)  Labs Reviewed - No data to display ____________________________________________   ____________________________________________  RADIOLOGY  CT of the head  and C-spine  ____________________________________________   PROCEDURES  Procedure(s) performed: No  Procedures    ____________________________________________   INITIAL IMPRESSION / ASSESSMENT AND PLAN / ED COURSE  Pertinent labs & imaging results that were available during my care of the patient were reviewed by me and considered in my medical decision making (see chart for details).   Patient is a 30 year old male presents after head injury.  Patient is on Xarelto.  Also complained neck pain.  See HPI physical exam shows patient to appear stable at this time.  CT of the head and C-spine   ct head and cspine are negative for any acute abnormality  Explained results to patient and his wife, will obtain EMS transport for discharge  DEVONE TOUSLEY was evaluated in Emergency Department on 10/28/2020 for the symptoms described in the history of  present illness. He was evaluated in the context of the global COVID-19 pandemic, which necessitated consideration that the patient might be at risk for infection with the SARS-CoV-2 virus that causes COVID-19. Institutional protocols and algorithms that pertain to the evaluation of patients at risk for COVID-19 are in a state of rapid change based on information released by regulatory bodies including the CDC and federal and state organizations. These policies and algorithms were followed during the patient's care in the ED.    As part of my medical decision making, I reviewed the following data within the [place] History obtained from family, Nursing notes reviewed and incorporated, Old chart reviewed, Radiograph reviewed , Notes from prior ED visits and [place] Controlled Substance Database  ____________________________________________   FINAL CLINICAL IMPRESSION(S) / ED DIAGNOSES  Final diagnoses:  Minor head injury, initial encounter  Strain of neck muscle, initial encounter      NEW MEDICATIONS STARTED DURING THIS VISIT:  New Prescriptions   No medications on file     Note:  This document was prepared using Dragon voice recognition software and may include unintentional dictation errors.    Versie Starks, PA-C 10/28/20 1248    Lucrezia Starch, MD 10/28/20 [phone]

## 2020-10-28 NOTE — ED Notes (Signed)
Voicemail left with [facility] regarding patient's appointment today. They are closed for lunch until 1315; awaiting call back.

## 2020-10-28 NOTE — ED Triage Notes (Signed)
Presents s/p fall  States he was going to [facility] for an appt  Fell from stretcher  Hitting head  No loc  Is currently on blood thinner

## 2020-10-28 NOTE — ED Notes (Signed)
Spoke to [facility] who report they are able to take patient for his appointment. Unit clerk contacted for assistance in arranging transport.

## 2020-11-09 LAB — ACID FAST CULTURE WITH REFLEXED SENSITIVITIES (MYCOBACTERIA): Acid Fast Culture: NEGATIVE

## 2021-02-07 ENCOUNTER — Other Ambulatory Visit: Payer: Self-pay | Admitting: Neurology

## 2021-02-07 DIAGNOSIS — R29818 Other symptoms and signs involving the nervous system: Secondary | ICD-10-CM

## 2021-02-17 ENCOUNTER — Ambulatory Visit
Admission: RE | Admit: 2021-02-17 | Discharge: 2021-02-17 | Disposition: A | Discharge status: Home / Self Care | Payer: Medicaid Other | Source: Ambulatory Visit | Attending: Neurology | Admitting: Neurology

## 2021-02-17 ENCOUNTER — Other Ambulatory Visit: Payer: Self-pay | Admitting: Neurology

## 2021-02-17 ENCOUNTER — Other Ambulatory Visit: Payer: Self-pay

## 2021-02-17 DIAGNOSIS — Z1389 Encounter for screening for other disorder: Secondary | ICD-10-CM

## 2021-02-17 DIAGNOSIS — Z0189 Encounter for other specified special examinations: Secondary | ICD-10-CM

## 2021-02-17 DIAGNOSIS — R29818 Other symptoms and signs involving the nervous system: Secondary | ICD-10-CM | POA: Insufficient documentation

## 2021-02-17 IMAGING — MR MR CERVICAL SPINE W/O CM
5 series · 37 of 48 positions shown · non-contrast
Comparison: CT of the cervical spine [DATE].

CLINICAL DATA: SILES sign positive. Additional history
provided: Patient reports whole body fatigue beginning after
hospitalization for COVID in [DATE], left hand numbness with
loss of strength in hand. Right arm weakness with decreased range of
motion. Left foot drop.

EXAM:
MRI CERVICAL SPINE WITHOUT CONTRAST
TECHNIQUE: Multiplanar, multisequence MR imaging of the cervical spine was
performed. No intravenous contrast was administered.

[Series 9: T2 · sagittal · 3.0mm · 0.62mm/px · 6 of 15 slices shown (1 of 2)]
[im 1/15]
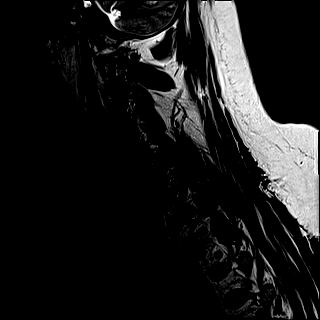
[im 3/15]
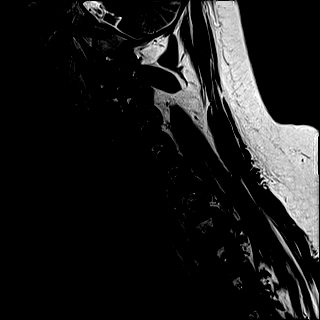
[im 6/15]
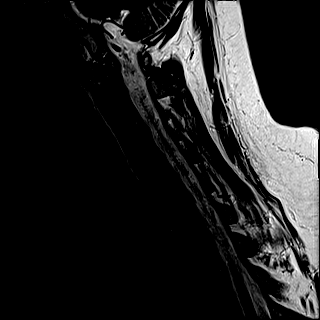
[im 9/15]
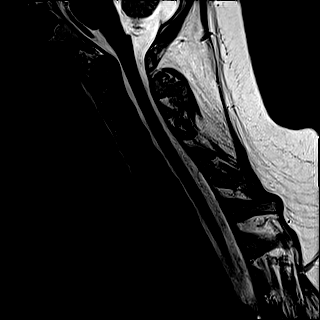
[im 12/15]
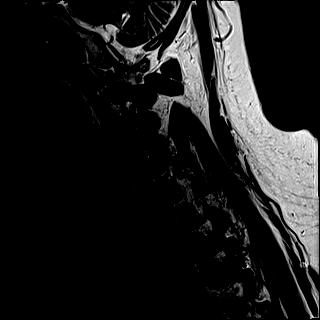
[im 15/15]
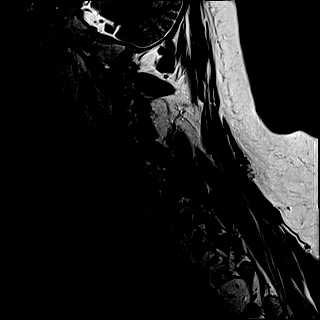

[Series 10: FLAIR · sagittal · 3.0mm · 0.78mm/px · 7 of 15 slices shown]
[im 1/15]
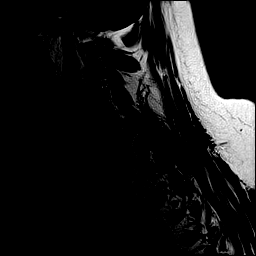
[im 3/15]
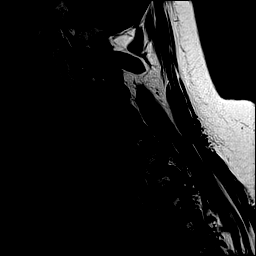
[im 5/15]
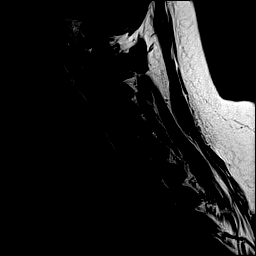
[im 8/15]
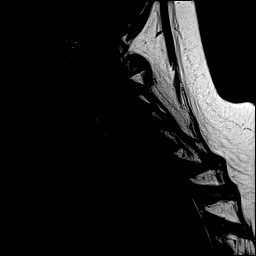
[im 10/15]
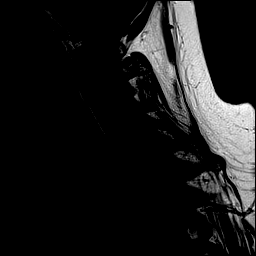
[im 12/15]
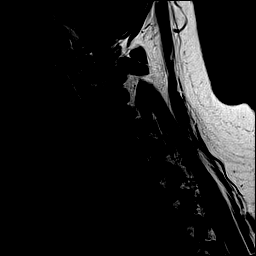
[im 15/15]
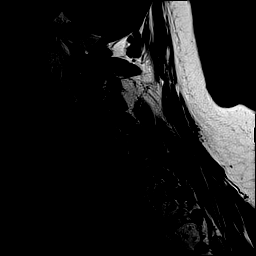

[Series 11: STIR · sagittal · 3.0mm · 0.62mm/px · 7 of 15 slices shown]
[im 1/15]
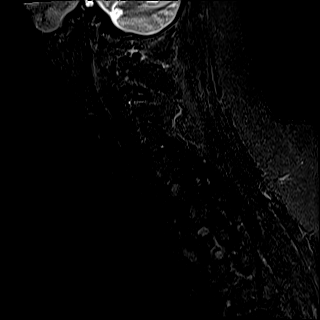
[im 3/15]
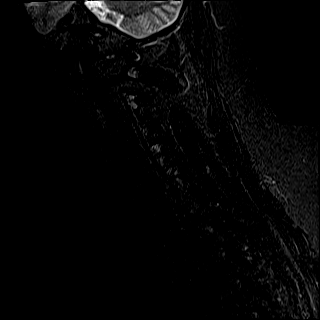
[im 5/15]
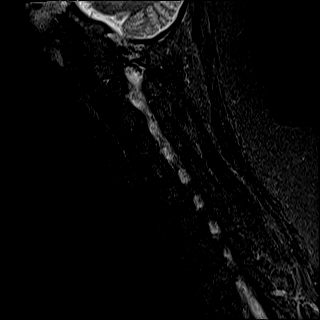
[im 8/15]
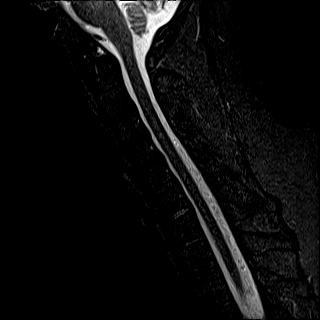
[im 10/15]
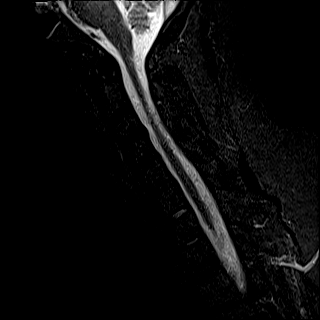
[im 12/15]
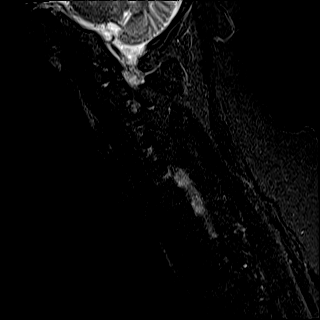
[im 15/15]
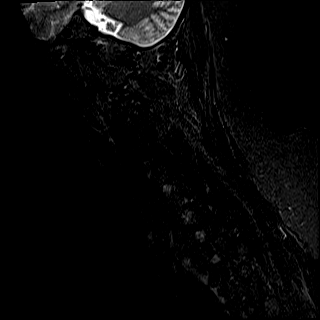

[Series 12: T2 · axial · 3.0mm · 0.70mm/px · z∈[-28,+68]mm · 9 of 31 slices shown (2 of 2)]
[im 1/31]
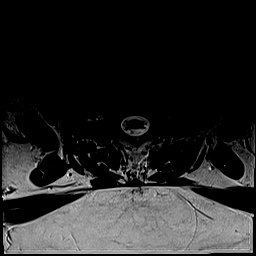
[im 3/31]
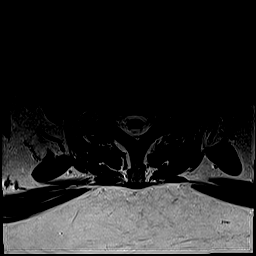
[im 5/31]
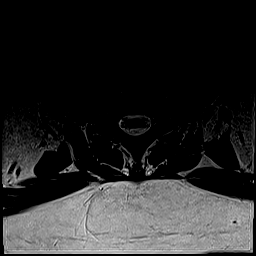
[im 10/31]
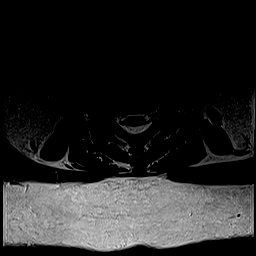
[im 14/31]
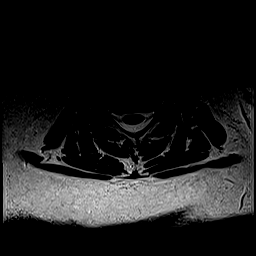
[im 17/31]
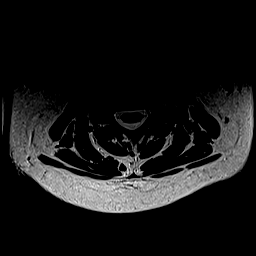
[im 21/31]
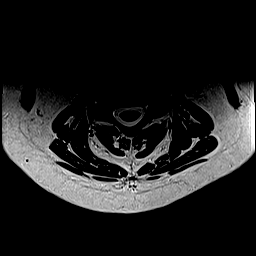
[im 26/31]
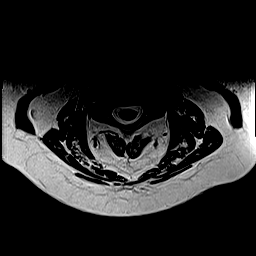
[im 31/31]
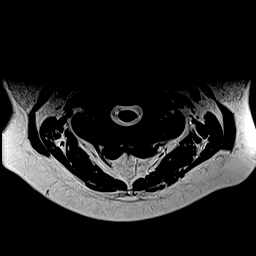

[Series 13: ax mpgr · axial · 3.0mm · 0.35mm/px · z∈[-28,+68]mm · 8 of 31 slices shown]
[im 1/31]
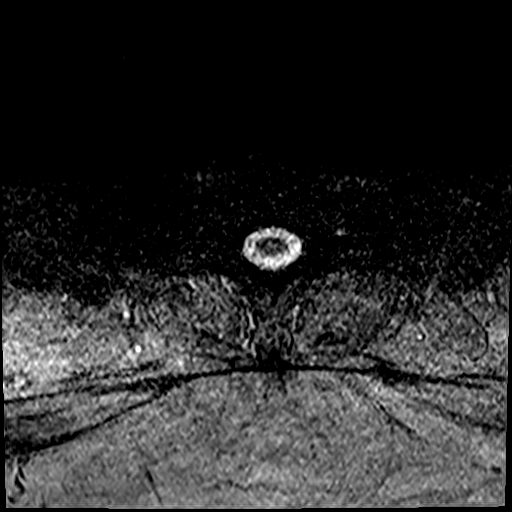
[im 5/31]
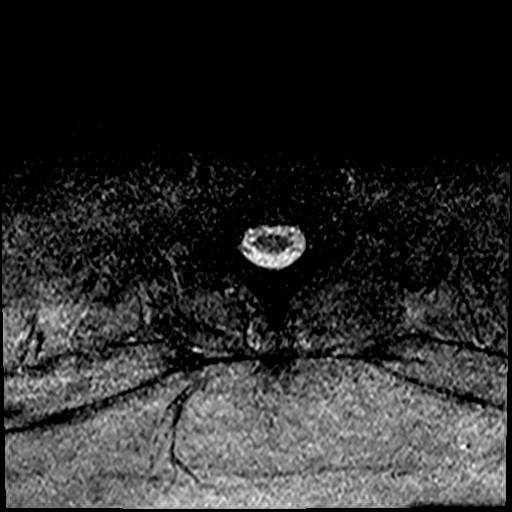
[im 10/31]
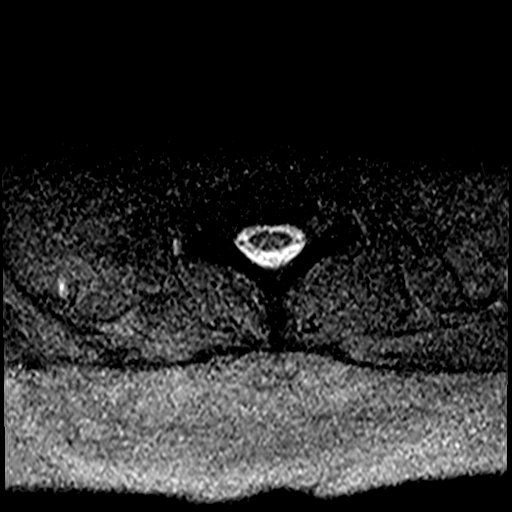
[im 14/31]
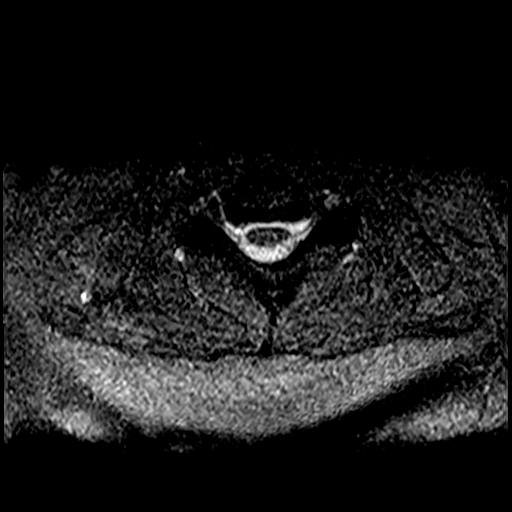
[im 17/31]
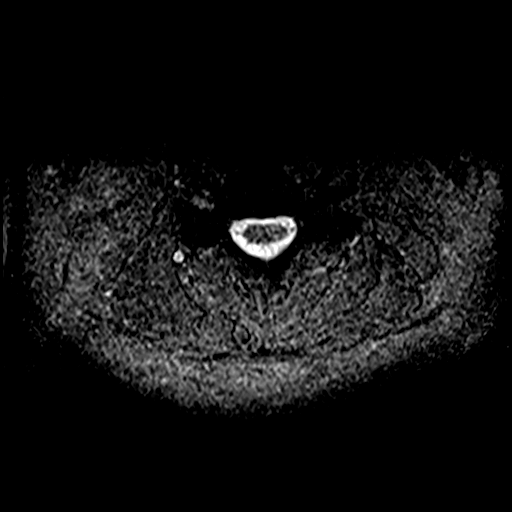
[im 21/31]
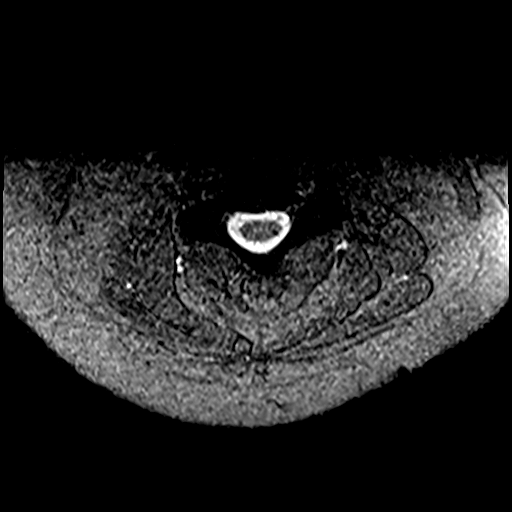
[im 26/31]
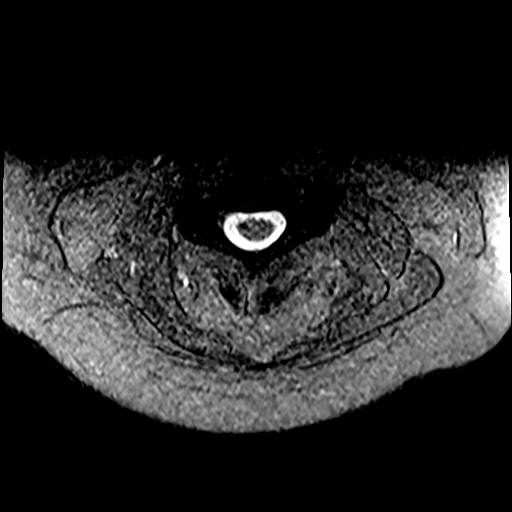
[im 31/31]
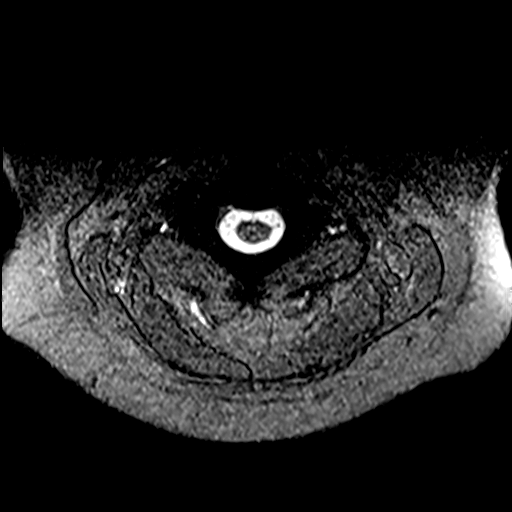

[37 of 48 positions shown; findings below may reference images not displayed]

FINDINGS: Alignment: Reversal of the expected cervical lordosis. Trace C4-C5
grade 1 anterolisthesis.

Vertebrae: Vertebral body height is maintained. Mild fatty
degenerative endplate marrow signal anteriorly at T1-T2. No
significant marrow edema or focal suspicious osseous lesion.

Cord: No spinal cord signal abnormality is identified.

Posterior Fossa, vertebral arteries, paraspinal tissues: No
abnormality identified within included portions of the posterior
fossa. Flow voids preserved within the imaged cervical vertebral
arteries. Paraspinal soft tissues within normal limits.

Disc levels:

No more than mild disc degeneration within the cervical or
visualized upper thoracic spine.

C2-C3: Shallow disc bulge. No significant spinal canal or foraminal
stenosis.

C3-C4: Shallow disc bulge. Mild uncovertebral and facet hypertrophy.
No significant spinal canal stenosis. Bilateral neural foraminal
narrowing (moderate right, mild left).

C4-C5: Trace grade 1 anterolisthesis. Slight disc uncovering with
shallow disc bulge. Facet hypertrophy (greater on the left). No
significant spinal canal stenosis. Mild left neural foraminal
narrowing.

C5-C6: No significant disc herniation or spinal canal stenosis. Mild
uncovertebral hypertrophy on the right with mild relative right
neural foraminal narrowing.

C6-C7: No significant disc herniation or stenosis.

C7-T1: No significant disc herniation or stenosis.
IMPRESSION: Cervical spondylosis, as outlined. No significant spinal canal
stenosis. Neural foraminal narrowing bilaterally at C3-C4 (moderate
right, mild left), on the left at C4-C5 (mild) and on the right at
C5-C6 (mild).

Reversal of the expected cervical lordosis.

Trace C4-C5 grade 1 anterolisthesis.

## 2021-02-21 ENCOUNTER — Inpatient Hospital Stay: Payer: Medicaid Other | Attending: Oncology | Admitting: Oncology

## 2021-02-21 ENCOUNTER — Inpatient Hospital Stay: Payer: Medicaid Other

## 2021-02-21 ENCOUNTER — Telehealth: Payer: Self-pay | Admitting: Oncology

## 2021-02-21 VITALS — BP 137/97 | HR 112 | Temp 99.1°F | Resp 18

## 2021-02-21 DIAGNOSIS — I10 Essential (primary) hypertension: Secondary | ICD-10-CM | POA: Diagnosis not present

## 2021-02-21 DIAGNOSIS — Z79899 Other long term (current) drug therapy: Secondary | ICD-10-CM | POA: Diagnosis not present

## 2021-02-21 DIAGNOSIS — D472 Monoclonal gammopathy: Secondary | ICD-10-CM

## 2021-02-21 LAB — CBC WITH DIFFERENTIAL/PLATELET
Abs Immature Granulocytes: 0.48 10*3/uL — ABNORMAL HIGH (ref 0.00–0.07)
Basophils Absolute: 0 10*3/uL (ref 0.0–0.1)
Basophils Relative: 0 %
Eosinophils Absolute: 0 10*3/uL (ref 0.0–0.5)
Eosinophils Relative: 0 %
HCT: 41 % (ref 39.0–52.0)
Hemoglobin: 13 g/dL (ref 13.0–17.0)
Immature Granulocytes: 3 %
Lymphocytes Relative: 11 %
Lymphs Abs: 1.7 10*3/uL (ref 0.7–4.0)
MCH: 28.6 pg (ref 26.0–34.0)
MCHC: 31.7 g/dL (ref 30.0–36.0)
MCV: 90.3 fL (ref 80.0–100.0)
Monocytes Absolute: 1.1 10*3/uL — ABNORMAL HIGH (ref 0.1–1.0)
Monocytes Relative: 7 %
Neutro Abs: 12.6 10*3/uL — ABNORMAL HIGH (ref 1.7–7.7)
Neutrophils Relative %: 79 %
Platelets: 170 10*3/uL (ref 150–400)
RBC: 4.54 MIL/uL (ref 4.22–5.81)
RDW: 16 % — ABNORMAL HIGH (ref 11.5–15.5)
WBC: 15.9 10*3/uL — ABNORMAL HIGH (ref 4.0–10.5)
nRBC: 0 % (ref 0.0–0.2)

## 2021-02-21 LAB — COMPREHENSIVE METABOLIC PANEL
ALT: 32 U/L (ref 0–44)
AST: 20 U/L (ref 15–41)
Albumin: 3.5 g/dL (ref 3.5–5.0)
Alkaline Phosphatase: 72 U/L (ref 38–126)
Anion gap: 10 (ref 5–15)
BUN: 17 mg/dL (ref 6–20)
CO2: 30 mmol/L (ref 22–32)
Calcium: 9 mg/dL (ref 8.9–10.3)
Chloride: 97 mmol/L — ABNORMAL LOW (ref 98–111)
Creatinine, Ser: 0.53 mg/dL — ABNORMAL LOW (ref 0.61–1.24)
GFR, Estimated: >60 mL/min (ref >60)
Glucose, Bld: 85 mg/dL (ref 70–99)
Potassium: 3.7 mmol/L (ref 3.5–5.1)
Sodium: 137 mmol/L (ref 135–145)
Total Bilirubin: 0.7 mg/dL (ref 0.3–1.2)
Total Protein: 6.9 g/dL (ref 6.5–8.1)

## 2021-02-21 LAB — LACTATE DEHYDROGENASE: LDH: 163 U/L (ref 98–192)

## 2021-02-21 NOTE — Progress Notes (Signed)
Patient reports right knee and left wrist pain. He sleeps with 1.5L of oxygen. He has colostomy bag.

## 2021-02-21 NOTE — Progress Notes (Signed)
Hematology/Oncology Consult note [facility] [phone] [phone]  Patient Care Team: [name], MD as PCP - General (Internal Medicine)   Name of the patient: Craig [name]  [id number]  [birth year]    Reason for referral-abnormal SPEP   Referring physician-Dr. Ouida Sills  Date of visit: 03/02/21   History of presenting illness- Patient is a 30 year old male who was initially admitted to the hospital in September 2021 perforated diverticulitis s/p partial colectomy and end colostomy.  He then had his colostomy takedown in November 2021.  He then presented with symptoms of fever tachycardia and pneumoperitoneum concerning for anastomotic leak and had to undergo another surgery for that and had a repeat colostomy done.  In December 2021 he was admitted to the hospital for COVID infection which required prolonged hospitalization and mechanical ventilation plus tracheostomy.  He was hospitalized for 67 days and became significantly deconditioned following that to the point that he could not walk.  He has been following up with rheumatology as well as neurology.  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  He has made slow improvement since then and from the point of being bedbound he is now able to hold onto side rails and ambulate slowly.  He has been referred to hematology for abnormal SPEP most recent CBC on 02/01/2021 showed white count of 18.6, H&H of 13.7/42.4 CMP was unremarkable including a normal calcium and total protein he underwent SPEP and was noted to have a mildly elevated IgM level of 219 and immunofixation showed IgM lambda paraprotein ANA was negative ESR elevated at 51  Patient is here with his wife today.  He reports ongoing fatigue and neurological symptoms including inability to raise his right arm above head as well as  sensory issues in both his bilateral upper and lower extremities.  ECOG PS- 3  Pain scale- 3   Review of systems- Review of Systems  Constitutional:  Positive for malaise/fatigue. Negative for chills, fever and weight loss.  HENT:  Negative for congestion, ear discharge and nosebleeds.   Eyes:  Negative for blurred vision.  Respiratory:  Negative for cough, hemoptysis, sputum production, shortness of breath and wheezing.   Cardiovascular:  Negative for chest pain, palpitations, orthopnea and claudication.  Gastrointestinal:  Negative for abdominal pain, blood in stool, constipation, diarrhea, heartburn, melena, nausea and vomiting.  Genitourinary:  Negative for dysuria, flank pain, frequency, hematuria and urgency.  Musculoskeletal:  Negative for back pain, joint pain and myalgias.  Skin:  Negative for rash.  Neurological:  Positive for sensory change and focal weakness. Negative for dizziness, tingling, seizures, weakness and headaches.  Endo/Heme/Allergies:  Does not bruise/bleed easily.  Psychiatric/Behavioral:  Negative for depression and suicidal ideas. The patient does not have insomnia.    Allergies  Allergen Reactions   Amoxicillin Rash    Tolerated cefepime and cefazolin 08/2020.    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   Diverticulitis of colon with perforation  11/22/2019     Past Medical History:  Diagnosis Date   Gout    Hypertension    Rupture of bowel [facility])      Past Surgical History:   Procedure Laterality Date   COLONOSCOPY WITH PROPOFOL N/A 05/18/2020   Procedure: COLONOSCOPY WITH PROPOFOL;  Surgeon: Benjamine Sprague, DO;  Location: [facility] ENDOSCOPY;  Service: General;  Laterality: N/A;   COLOSTOMY N/A 11/22/2019   Procedure: COLOSTOMY;  Surgeon: Herbert Pun, MD;  Location: [facility] ORS;  Service: General;  Laterality: N/A;   LAPAROTOMY N/A 11/22/2019   Procedure: EXPLORATORY LAPAROTOMY;  Surgeon: Herbert Pun, MD;  Location: [facility] ORS;  Service: General;  Laterality: N/A;   LAPAROTOMY N/A 07/08/2020   Procedure: EXPLORATORY LAPAROTOMY;  Surgeon: Herbert Pun, MD;  Location: [facility] ORS;  Service: General;  Laterality: N/A;   PARTIAL COLECTOMY N/A 11/22/2019   Procedure: PARTIAL COLECTOMY;  Surgeon: Herbert Pun, MD;  Location: [facility] ORS;  Service: General;  Laterality: N/A;   PEG PLACEMENT N/A 09/14/2020   Procedure: PERCUTANEOUS ENDOSCOPIC GASTROSTOMY (PEG) PLACEMENT;  Surgeon: Benjamine Sprague, DO;  Location: [facility] ENDOSCOPY;  Service: General;  Laterality: N/A;  TRAVEL CASE TRACH [place] N/A 09/13/2020   Procedure: TRACHEOSTOMY;  Surgeon: Beverly Gust, MD;  Location: [facility] ORS;  Service: ENT;  Laterality: N/A;   XI ROBOTIC ASSISTED COLOSTOMY TAKEDOWN N/A 06/28/2020   Procedure: XI ROBOTIC ASSISTED COLOSTOMY TAKEDOWN CONVERTED TO OPEN PROCEDURE;  Surgeon: Herbert Pun, MD;  Location: [facility] ORS;  Service: General;  Laterality: N/A;    Social History   Socioeconomic History   Marital status: Married    Spouse name: Not on file   Number of children: Not on file   Years of education: Not on file   Highest education level: Not on file  Occupational History   Not on file  Tobacco Use   Smoking status: Never   Smokeless tobacco: Current    Types: Chew  Vaping Use   Vaping Use: Never used  Substance and Sexual Activity   Alcohol use: Yes    Comment: pint to a fifth per day per patient . Patient reports he  hasn't had anything to drink in three weeks.   Drug use: Never   Sexual activity: Yes    Birth control/protection: None  Other Topics Concern   Not on file  Social History Narrative   Not on file   Social Determinants of Health   Financial Resource Strain: Not on file  Food Insecurity: Not on file  Transportation Needs: Not on file  Physical Activity: Not on file  Stress: Not on file  Social Connections: Not on file  Intimate Partner Violence: Not on file     Family History  Problem Relation Age of Onset   Healthy Mother    Healthy Father      Current Outpatient Medications:    acetaminophen (TYLENOL) 650 MG CR tablet, Take by mouth., Disp: , Rfl:    allopurinol (ZYLOPRIM) 300 MG tablet, Take 300 mg by mouth daily., Disp: , Rfl:    cetirizine (ZYRTEC) 10 MG tablet, Take 10 mg by mouth daily., Disp: , Rfl:    Cholecalciferol (VITAMIN D3) 25 MCG (1000 UT) CAPS, Take by mouth., Disp: , Rfl:    clonazePAM (KLONOPIN) 1 MG tablet, Take by mouth., Disp: , Rfl:    [employer] SALINE NASAL SPRAY 0.65 % nasal spray, SMARTSIG:1 Spray(s) Both Nares Every 4 Hours PRN, Disp: , Rfl:    Cyanocobalamin (VITAMIN B-12  IJ), Inject as directed., Disp: , Rfl:    cyanocobalamin 1000 MCG tablet, Take 1,000 mcg by mouth daily., Disp: , Rfl:    doxazosin (CARDURA) 1 MG tablet, Take by mouth., Disp: , Rfl:    doxycycline (VIBRAMYCIN) 100 MG capsule, Take 100 mg by mouth 2 (two) times daily., Disp: , Rfl:    DULoxetine (CYMBALTA) 60 MG capsule, Take by mouth., Disp: , Rfl:    ergocalciferol (VITAMIN D2) 1.25 MG (50000 UT) capsule, Take by mouth., Disp: , Rfl:    fluticasone-salmeterol (ADVAIR) 100-50 MCG/ACT AEPB, Inhale into the lungs., Disp: , Rfl:    HYDROcodone-acetaminophen (NORCO/VICODIN) 5-325 MG tablet, Take 1 tablet by mouth every 6 (six) hours as needed., Disp: , Rfl:    lisinopril (ZESTRIL) 5 MG tablet, Take by mouth., Disp: , Rfl:    melatonin 3 MG TABS tablet, Take 6 mg by mouth at bedtime as  needed., Disp: , Rfl:    metoprolol succinate (TOPROL-XL) 100 MG 24 hr tablet, Take by mouth., Disp: , Rfl:    Multiple Vitamins-Minerals (MULTIVITAMIN WITH MINERALS) tablet, Take 1 tablet by mouth daily., Disp: , Rfl:    Omega-3 Fatty Acids (FISH OIL) 1000 MG CAPS, Take by mouth., Disp: , Rfl:    OXYGEN, Inhale into the lungs. 1.5 L with sleep, Disp: , Rfl:    predniSONE (DELTASONE) 5 MG tablet, 6 pills x1day, 5x1,4x1,3x1,2x1,1x1, Disp: , Rfl:    pregabalin (LYRICA) 150 MG capsule, Take by mouth., Disp: , Rfl:    rivaroxaban (XARELTO) 20 MG TABS tablet, Take by mouth., Disp: , Rfl:    famotidine (PEPCID) 20 MG tablet, Take by mouth. (Patient not taking: Reported on 02/21/2021), Disp: , Rfl:    ondansetron (ZOFRAN) 4 MG tablet, Take by mouth. (Patient not taking: Reported on 02/21/2021), Disp: , Rfl:    Physical exam:  Vitals:   02/21/21 1527  BP: (!) 137/97  Pulse: (!) 112  Resp: 18  Temp: 99.1 F (37.3 C)  TempSrc: Tympanic  SpO2: 95%   Physical Exam Constitutional:      Appearance: He is obese.     Comments: He is sitting in a wheelchair and appears fatigued  Cardiovascular:     Rate and Rhythm: Normal rate and regular rhythm.     Heart sounds: Normal heart sounds.  Pulmonary:     Effort: Pulmonary effort is normal.     Breath sounds: Normal breath sounds.  Abdominal:     General: Bowel sounds are normal.     Palpations: Abdomen is soft.  Skin:    General: Skin is warm and dry.  Neurological:     Mental Status: He is alert and oriented to person, place, and time.     Comments: Full neuro exam not done. Motor power 4/5 lower extremities. RUE power 3/5       CMP Latest Ref Rng & Units 02/21/2021  Glucose 70 - 99 mg/dL 85  BUN 6 - 20 mg/dL 17  Creatinine 0.61 - 1.24 mg/dL 0.53(L)  Sodium 135 - 145 mmol/L 137  Potassium 3.5 - 5.1 mmol/L 3.7  Chloride 98 - 111 mmol/L 97(L)  CO2 22 - 32 mmol/L 30  Calcium 8.9 - 10.3 mg/dL 9.0  Total Protein 6.5 - 8.1 g/dL 6.9  Total  Bilirubin 0.3 - 1.2 mg/dL 0.7  Alkaline Phos 38 - 126 U/L 72  AST 15 - 41 U/L 20  ALT 0 - 44 U/L 32   CBC Latest Ref Rng & Units 02/21/2021    WBC 4.0 - 10.5 K/uL 15.9(H)  Hemoglobin 13.0 - 17.0 g/dL 13.0  Hematocrit 39.0 - 52.0 % 41.0  Platelets 150 - 400 K/uL 170    No images are attached to the encounter.  DG Eye Foreign Body  Result Date: 02/17/2021 CLINICAL DATA:  Metal working/exposure; clearance prior to MRI EXAM: ORBITS FOR FOREIGN BODY - 2 VIEW COMPARISON:  None. FINDINGS: There is no evidence of metallic foreign body within the orbits. No significant bone abnormality identified. IMPRESSION: No evidence of metallic foreign body within the orbits. Electronically Signed   By: Jeffrey  Hu M.D.   On: 02/17/2021 15:46   MR CERVICAL SPINE WO CONTRAST  Result Date: 02/17/2021 CLINICAL DATA:  Lhermitte sign positive. Additional history provided: Patient reports whole body fatigue beginning after hospitalization for COVID in December 2021, left hand numbness with loss of strength in hand. Right arm weakness with decreased range of motion. Left foot drop. EXAM: MRI CERVICAL SPINE WITHOUT CONTRAST TECHNIQUE: Multiplanar, multisequence MR imaging of the cervical spine was performed. No intravenous contrast was administered. COMPARISON:  CT of the cervical spine 10/28/2020. FINDINGS: Alignment: Reversal of the expected cervical lordosis. Trace C4-C5 grade 1 anterolisthesis. Vertebrae: Vertebral body height is maintained. Mild fatty degenerative endplate marrow signal anteriorly at T1-T2. No significant marrow edema or focal suspicious osseous lesion. Cord: No spinal cord signal abnormality is identified. Posterior Fossa, vertebral arteries, paraspinal tissues: No abnormality identified within included portions of the posterior fossa. Flow voids preserved within the imaged cervical vertebral arteries. Paraspinal soft tissues within normal limits. Disc levels: No more than mild disc degeneration within the  cervical or visualized upper thoracic spine. C2-C3: Shallow disc bulge. No significant spinal canal or foraminal stenosis. C3-C4: Shallow disc bulge. Mild uncovertebral and facet hypertrophy. No significant spinal canal stenosis. Bilateral neural foraminal narrowing (moderate right, mild left). C4-C5: Trace grade 1 anterolisthesis. Slight disc uncovering with shallow disc bulge. Facet hypertrophy (greater on the left). No significant spinal canal stenosis. Mild left neural foraminal narrowing. C5-C6: No significant disc herniation or spinal canal stenosis. Mild uncovertebral hypertrophy on the right with mild relative right neural foraminal narrowing. C6-C7: No significant disc herniation or stenosis. C7-T1: No significant disc herniation or stenosis. IMPRESSION: Cervical spondylosis, as outlined. No significant spinal canal stenosis. Neural foraminal narrowing bilaterally at C3-C4 (moderate right, mild left), on the left at C4-C5 (mild) and on the right at C5-C6 (mild). Reversal of the expected cervical lordosis. Trace C4-C5 grade 1 anterolisthesis. Electronically Signed   By: Kyle  Golden DO   On: 02/17/2021 17:58    Assessment and plan- Patient is a 30 y.o. 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  Usually elevations of IgM when significant can cause Walden Strom's macroglobulinemia more than IgM myeloma which is very rare.  Presently IgM levels are mildly elevated and monoclonal protein was just about detected on immunofixation.  Therefore these findings do not explain patient's neurological symptoms.  Neuropathy can be seen in patients with Walden Strom's macroglobulinemia but based on his labs I do not feel that patient has Walden Strom's.  Today I will be checking a CBC with differential CMP myeloma panel beta-2 microglobulin serum free light chains and LDH video visit with the patient in 2 weeks time  Thank you for  this kind referral and the opportunity to participate in the care of this patient   Visit Diagnosis 1. MGUS (monoclonal gammopathy   of unknown significance)     Dr. Randa Evens, MD, MPH [facility] [phone] 03/02/2021 8:22 AM              ]

## 2021-02-22 LAB — KAPPA/LAMBDA LIGHT CHAINS
Kappa free light chain: 10.8 mg/L (ref 3.3–19.4)
Kappa, lambda light chain ratio: 0.82 (ref 0.26–1.65)
Lambda free light chains: 13.2 mg/L (ref 5.7–26.3)

## 2021-02-22 LAB — BETA 2 MICROGLOBULIN, SERUM: Beta-2 Microglobulin: 2 mg/L (ref 0.6–2.4)

## 2021-02-27 LAB — MULTIPLE MYELOMA PANEL, SERUM
Albumin SerPl Elph-Mcnc: 3.2 g/dL (ref 2.9–4.4)
Albumin/Glob SerPl: 1.1 (ref 0.7–1.7)
Alpha 1: 0.2 g/dL (ref 0.0–0.4)
Alpha2 Glob SerPl Elph-Mcnc: 1 g/dL (ref 0.4–1.0)
B-Globulin SerPl Elph-Mcnc: 1.2 g/dL (ref 0.7–1.3)
Gamma Glob SerPl Elph-Mcnc: 0.6 g/dL (ref 0.4–1.8)
Globulin, Total: 3 g/dL (ref 2.2–3.9)
IgA: 258 mg/dL (ref 90–386)
IgG (Immunoglobin G), Serum: 603 mg/dL (ref 603–1613)
IgM (Immunoglobulin M), Srm: 113 mg/dL (ref 20–172)
Total Protein ELP: 6.2 g/dL (ref 6.0–8.5)

## 2021-03-07 ENCOUNTER — Other Ambulatory Visit
Admission: RE | Admit: 2021-03-07 | Discharge: 2021-03-07 | Disposition: A | Discharge status: Home / Self Care | Payer: Medicaid Other | Attending: Pulmonary Disease | Admitting: Pulmonary Disease

## 2021-03-07 DIAGNOSIS — R2990 Unspecified symptoms and signs involving the nervous system: Secondary | ICD-10-CM | POA: Insufficient documentation

## 2021-03-07 DIAGNOSIS — U099 Post covid-19 condition, unspecified: Secondary | ICD-10-CM | POA: Diagnosis present

## 2021-03-07 LAB — D-DIMER, QUANTITATIVE: D-Dimer, Quant: 0.63 ug/mL-FEU — ABNORMAL HIGH (ref 0.00–0.50)

## 2021-03-08 ENCOUNTER — Encounter: Payer: Self-pay | Admitting: Oncology

## 2021-03-08 ENCOUNTER — Inpatient Hospital Stay (HOSPITAL_BASED_OUTPATIENT_CLINIC_OR_DEPARTMENT_OTHER): Payer: Medicaid Other | Admitting: Oncology

## 2021-03-08 ENCOUNTER — Other Ambulatory Visit: Payer: Self-pay

## 2021-03-08 DIAGNOSIS — D472 Monoclonal gammopathy: Secondary | ICD-10-CM

## 2021-03-08 NOTE — Progress Notes (Signed)
I connected with Craig [name] on 03/08/21 at  1:45 PM EDT by telephone visit and verified that I am speaking with the correct person using two identifiers.   I discussed the limitations, risks, security and privacy concerns of performing an evaluation and management service by telemedicine and the availability of in-person appointments. I also discussed with the patient that there may be a patient responsible charge related to this service. The patient expressed understanding and agreed to proceed.  Other persons participating in the visit and their role in the encounter:  patients wife  Patient's location:  home Provider's location:  work  Risk analyst Complaint: Discuss results of blood work  History of present illness: Patient is a 30 year old male who was initially admitted to the hospital in September 2021 perforated diverticulitis s/p partial colectomy and end colostomy.  He then had his colostomy takedown in November 2021.  He then presented with symptoms of fever tachycardia and pneumoperitoneum concerning for anastomotic leak and had to undergo another surgery for that and had a repeat colostomy done.  In December 2021 he was admitted to the hospital for COVID infection which required prolonged hospitalization and mechanical ventilation plus tracheostomy.  He was hospitalized for 67 days and became significantly deconditioned following that to the point that he could not walk.  He has been following up with rheumatology as well as neurology.  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  He has made slow improvement since then and from the point of being bedbound he is now able to hold onto side rails and ambulate slowly.   He has been referred to hematology for abnormal SPEP most recent CBC on 02/01/2021 showed white count of 18.6, H&H of 13.7/42.4 CMP was unremarkable including a normal calcium and  total protein he underwent SPEP and was noted to have a mildly elevated IgM level of 219 and immunofixation showed IgM lambda paraprotein ANA was negative ESR elevated at 51   Patient is here with his wife today.  He reports ongoing fatigue and neurological symptoms including inability to raise his right arm above head as well as sensory issues in both his bilateral upper and lower extremities.   Results of blood work from 02/21/2021 were as follows: CMP including serum calcium was unremarkable total protein normal.  Serum creatinine improving from prior value of less than 0.3-0.53.  CBC showed H&H of 13/41.  Mild leukocytosis with a white count of 15.9 with predominant neutrophilia.  Myeloma panel showed no M protein and IgM levels were normal.  Serum free kappa light chain and lambda light chains normal with a normal free light chain ratio.  LDH and beta-2 microglobulin normal.  Interval history patient reports exertional shortness of breath as well as ongoing neurological issues including weakness numbness upper and lower extremities.   Review of Systems  Constitutional:  Positive for malaise/fatigue. Negative for chills, fever and weight loss.  HENT:  Negative for congestion, ear discharge and nosebleeds.   Eyes:  Negative for blurred vision.  Respiratory:  Positive for shortness of breath. Negative for cough, hemoptysis, sputum production and wheezing.   Cardiovascular:  Negative for chest pain, palpitations, orthopnea and claudication.  Gastrointestinal:  Negative for abdominal pain, blood in stool, constipation, diarrhea, heartburn, melena, nausea and vomiting.  Genitourinary:  Negative for dysuria, flank pain, frequency, hematuria and urgency.  Musculoskeletal:  Positive for joint pain. Negative for back pain and myalgias.  Skin:  Negative  for rash.  Neurological:  Positive for weakness. Negative for dizziness, tingling, focal weakness, seizures and headaches.  Endo/Heme/Allergies:  Does  not bruise/bleed easily.  Psychiatric/Behavioral:  Negative for depression and suicidal ideas. The patient does not have insomnia.    Allergies  Allergen Reactions   Amoxicillin Rash    Tolerated cefepime and cefazolin 08/2020.    Past Medical History:  Diagnosis Date   Gout    Hypertension    Rupture of bowel [facility])     Past Surgical History:  Procedure Laterality Date   COLONOSCOPY WITH PROPOFOL N/A 05/18/2020   Procedure: COLONOSCOPY WITH PROPOFOL;  Surgeon: Benjamine Sprague, DO;  Location: [facility] ENDOSCOPY;  Service: General;  Laterality: N/A;   COLOSTOMY N/A 11/22/2019   Procedure: COLOSTOMY;  Surgeon: Herbert Pun, MD;  Location: [facility] ORS;  Service: General;  Laterality: N/A;   LAPAROTOMY N/A 11/22/2019   Procedure: EXPLORATORY LAPAROTOMY;  Surgeon: Herbert Pun, MD;  Location: [facility] ORS;  Service: General;  Laterality: N/A;   LAPAROTOMY N/A 07/08/2020   Procedure: EXPLORATORY LAPAROTOMY;  Surgeon: Herbert Pun, MD;  Location: [facility] ORS;  Service: General;  Laterality: N/A;   PARTIAL COLECTOMY N/A 11/22/2019   Procedure: PARTIAL COLECTOMY;  Surgeon: Herbert Pun, MD;  Location: [facility] ORS;  Service: General;  Laterality: N/A;   PEG PLACEMENT N/A 09/14/2020   Procedure: PERCUTANEOUS ENDOSCOPIC GASTROSTOMY (PEG) PLACEMENT;  Surgeon: Benjamine Sprague, DO;  Location: [facility] ENDOSCOPY;  Service: General;  Laterality: N/A;  TRAVEL CASE TRACH Martell N/A 09/13/2020   Procedure: TRACHEOSTOMY;  Surgeon: Beverly Gust, MD;  Location: [facility] ORS;  Service: ENT;  Laterality: N/A;   XI ROBOTIC ASSISTED COLOSTOMY TAKEDOWN N/A 06/28/2020   Procedure: XI ROBOTIC ASSISTED COLOSTOMY TAKEDOWN CONVERTED TO OPEN PROCEDURE;  Surgeon: Herbert Pun, MD;  Location: [facility] ORS;  Service: General;  Laterality: N/A;    Social History   Socioeconomic History   Marital status: Married    Spouse name: Not on file   Number of children: Not on file    Years of education: Not on file   Highest education level: Not on file  Occupational History   Not on file  Tobacco Use   Smoking status: Never   Smokeless tobacco: Current    Types: Chew  Vaping Use   Vaping Use: Never used  Substance and Sexual Activity   Alcohol use: Yes    Comment: pint to a fifth per day per patient . Patient reports he hasn't had anything to drink in three weeks.   Drug use: Never   Sexual activity: Yes    Birth control/protection: None  Other Topics Concern   Not on file  Social History Narrative   Not on file   Social Determinants of Health   Financial Resource Strain: Not on file  Food Insecurity: Not on file  Transportation Needs: Not on file  Physical Activity: Not on file  Stress: Not on file  Social Connections: Not on file  Intimate Partner Violence: Not on file    Family History  Problem Relation Age of Onset   Healthy Mother    Healthy Father      Current Outpatient Medications:    acetaminophen (TYLENOL) 650 MG CR tablet, Take by mouth., Disp: , Rfl:    albuterol (ACCUNEB) 1.25 MG/3ML nebulizer solution, Take by nebulization., Disp: , Rfl:    allopurinol (ZYLOPRIM) 300 MG tablet, Take 300 mg by mouth daily., Disp: , Rfl:    budesonide (PULMICORT) 0.25 MG/2ML  nebulizer solution, Inhale into the lungs., Disp: , Rfl:    cetirizine (ZYRTEC) 10 MG tablet, Take 10 mg by mouth daily., Disp: , Rfl:    Cholecalciferol (VITAMIN D3) 25 MCG (1000 UT) CAPS, Take by mouth., Disp: , Rfl:    clonazePAM (KLONOPIN) 1 MG tablet, Take by mouth., Disp: , Rfl:    [employer] SALINE NASAL SPRAY 0.65 % nasal spray, SMARTSIG:1 Spray(s) Both Nares Every 4 Hours PRN, Disp: , Rfl:    Cyanocobalamin (VITAMIN B-12 IJ), Inject as directed., Disp: , Rfl:    cyanocobalamin 1000 MCG tablet, Take 1,000 mcg by mouth daily., Disp: , Rfl:    doxazosin (CARDURA) 1 MG tablet, Take by mouth., Disp: , Rfl:    doxycycline (VIBRAMYCIN) 100 MG capsule, Take 100 mg by mouth 2 (two)  times daily., Disp: , Rfl:    DULoxetine (CYMBALTA) 60 MG capsule, Take by mouth., Disp: , Rfl:    ergocalciferol (VITAMIN D2) 1.25 MG (50000 UT) capsule, Take by mouth., Disp: , Rfl:    folic acid (FOLVITE) 1 MG tablet, Take by mouth., Disp: , Rfl:    furosemide (LASIX) 20 MG tablet, Take 20 mg by mouth daily., Disp: , Rfl:    lisinopril (ZESTRIL) 5 MG tablet, Take by mouth., Disp: , Rfl:    melatonin 3 MG TABS tablet, Take 6 mg by mouth at bedtime as needed., Disp: , Rfl:    methotrexate (RHEUMATREX) 2.5 MG tablet, Take by mouth., Disp: , Rfl:    metoprolol succinate (TOPROL-XL) 100 MG 24 hr tablet, Take by mouth., Disp: , Rfl:    Multiple Vitamins-Minerals (MULTIVITAMIN WITH MINERALS) tablet, Take 1 tablet by mouth daily., Disp: , Rfl:    Omega-3 Fatty Acids (FISH OIL) 1000 MG CAPS, Take by mouth., Disp: , Rfl:    OXYGEN, Inhale into the lungs. 1.5 L with sleep, Disp: , Rfl:    predniSONE (DELTASONE) 20 MG tablet, Take 20 mg by mouth daily., Disp: , Rfl:    pregabalin (LYRICA) 200 MG capsule, Take by mouth., Disp: , Rfl:    rivaroxaban (XARELTO) 20 MG TABS tablet, Take by mouth., Disp: , Rfl:    famotidine (PEPCID) 20 MG tablet, Take by mouth. (Patient not taking: No sig reported), Disp: , Rfl:    fluticasone-salmeterol (ADVAIR) 100-50 MCG/ACT AEPB, Inhale into the lungs. (Patient not taking: Reported on 03/08/2021), Disp: , Rfl:    HYDROcodone-acetaminophen (NORCO/VICODIN) 5-325 MG tablet, Take 1 tablet by mouth every 6 (six) hours as needed. (Patient not taking: Reported on 03/08/2021), Disp: , Rfl:    ondansetron (ZOFRAN) 4 MG tablet, Take by mouth. (Patient not taking: No sig reported), Disp: , Rfl:    predniSONE (DELTASONE) 5 MG tablet, 6 pills x1day, 5x1,4x1,3x1,2x1,1x1 (Patient not taking: Reported on 03/08/2021), Disp: , Rfl:   DG Eye Foreign Body  Result Date: 02/17/2021 CLINICAL DATA:  Metal working/exposure; clearance prior to MRI EXAM: ORBITS FOR FOREIGN BODY - 2 VIEW COMPARISON:   None. FINDINGS: There is no evidence of metallic foreign body within the orbits. No significant bone abnormality identified. IMPRESSION: No evidence of metallic foreign body within the orbits. Electronically Signed   By: Margarette Canada M.D.   On: 02/17/2021 15:46   MR CERVICAL SPINE WO CONTRAST  Result Date: 02/17/2021 CLINICAL DATA:  Lhermitte sign positive. Additional history provided: Patient reports whole body fatigue beginning after hospitalization for COVID in December 2021, left hand numbness with loss of strength in hand. Right arm weakness with decreased range of motion. Left  foot drop. EXAM: MRI CERVICAL SPINE WITHOUT CONTRAST TECHNIQUE: Multiplanar, multisequence MR imaging of the cervical spine was performed. No intravenous contrast was administered. COMPARISON:  CT of the cervical spine 10/28/2020. FINDINGS: Alignment: Reversal of the expected cervical lordosis. Trace C4-C5 grade 1 anterolisthesis. Vertebrae: Vertebral body height is maintained. Mild fatty degenerative endplate marrow signal anteriorly at T1-T2. No significant marrow edema or focal suspicious osseous lesion. Cord: No spinal cord signal abnormality is identified. Posterior Fossa, vertebral arteries, paraspinal tissues: No abnormality identified within included portions of the posterior fossa. Flow voids preserved within the imaged cervical vertebral arteries. Paraspinal soft tissues within normal limits. Disc levels: No more than mild disc degeneration within the cervical or visualized upper thoracic spine. C2-C3: Shallow disc bulge. No significant spinal canal or foraminal stenosis. C3-C4: Shallow disc bulge. Mild uncovertebral and facet hypertrophy. No significant spinal canal stenosis. Bilateral neural foraminal narrowing (moderate right, mild left). C4-C5: Trace grade 1 anterolisthesis. Slight disc uncovering with shallow disc bulge. Facet hypertrophy (greater on the left). No significant spinal canal stenosis. Mild left neural  foraminal narrowing. C5-C6: No significant disc herniation or spinal canal stenosis. Mild uncovertebral hypertrophy on the right with mild relative right neural foraminal narrowing. C6-C7: No significant disc herniation or stenosis. C7-T1: No significant disc herniation or stenosis. IMPRESSION: Cervical spondylosis, as outlined. No significant spinal canal stenosis. Neural foraminal narrowing bilaterally at C3-C4 (moderate right, mild left), on the left at C4-C5 (mild) and on the right at C5-C6 (mild). Reversal of the expected cervical lordosis. Trace C4-C5 grade 1 anterolisthesis. Electronically Signed   By: Kellie Simmering DO   On: 02/17/2021 17:58    No images are attached to the encounter.   CMP Latest Ref Rng & Units 02/21/2021  Glucose 70 - 99 mg/dL 85  BUN 6 - 20 mg/dL 17  Creatinine 0.61 - 1.24 mg/dL 0.53(L)  Sodium 135 - 145 mmol/L 137  Potassium 3.5 - 5.1 mmol/L 3.7  Chloride 98 - 111 mmol/L 97(L)  CO2 22 - 32 mmol/L 30  Calcium 8.9 - 10.3 mg/dL 9.0  Total Protein 6.5 - 8.1 g/dL 6.9  Total Bilirubin 0.3 - 1.2 mg/dL 0.7  Alkaline Phos 38 - 126 U/L 72  AST 15 - 41 U/L 20  ALT 0 - 44 U/L 32   CBC Latest Ref Rng & Units 02/21/2021  WBC 4.0 - 10.5 K/uL 15.9(H)  Hemoglobin 13.0 - 17.0 g/dL 13.0  Hematocrit 39.0 - 52.0 % 41.0  Platelets 150 - 400 K/uL 170     Assessment and plan: Patient is a 30 year old male with a complicated past medical history including recent hospitalization for COVID with 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  Repeat myeloma panel done at [facility] reveals normal immunoglobulin levels with no M protein noted on immunofixation on SPEP.  Normal serum free light chains.  Patient therefore does not have any evidence of monoclonal gammopathy and does not require follow-up with me at this time.  Patient does have mild  leukocytosis/neutrophilia which is likely reactive and sometimes exacerbated by steroids.  This can be monitored by his primary care Dr. Ouida Sills  Follow-up instructions: No follow-up needed  I discussed the assessment and treatment plan with the patient. The patient was provided an opportunity to ask questions and all were answered. The patient agreed with the plan and demonstrated an understanding of the instructions.  The patient was advised to call back or seek an in-person evaluation if the symptoms worsen or if the condition fails to improve as anticipated.  I provided 11 minutes of non face-to-face telephone visit time during this encounter, and > 50% was spent counseling as documented under my assessment & plan.  Visit Diagnosis: 1. IgM lambda paraproteinemia     Dr. Randa Evens, MD, MPH [facility] at [facility] Tel- [phone] 03/08/2021 4:29 PM

## 2021-03-08 NOTE — Progress Notes (Signed)
Patient has had to increase his oxygen today due to desaturating, he is on 2L and staying at 95%. He has been in touch with his PCP and Pulmonologist about this. He also had labs done at [facility] yesterday and would like for Dr Janese Banks to review. He is having wrist and knee pain still.

## 2021-03-15 ENCOUNTER — Other Ambulatory Visit: Payer: Self-pay | Admitting: Pulmonary Disease

## 2021-03-15 DIAGNOSIS — R7989 Other specified abnormal findings of blood chemistry: Secondary | ICD-10-CM

## 2021-03-17 ENCOUNTER — Ambulatory Visit: Admission: RE | Admit: 2021-03-17 | Payer: Medicaid Other | Source: Ambulatory Visit

## 2021-03-20 ENCOUNTER — Ambulatory Visit: Admission: RE | Admit: 2021-03-20 | Payer: Medicaid Other | Source: Ambulatory Visit

## 2021-03-24 ENCOUNTER — Ambulatory Visit: Admission: RE | Admit: 2021-03-24 | Payer: Medicaid Other | Source: Ambulatory Visit

## 2021-03-27 ENCOUNTER — Encounter: Payer: Self-pay | Admitting: *Deleted

## 2021-03-27 ENCOUNTER — Encounter: Payer: Medicaid Other | Attending: Pulmonary Disease | Admitting: *Deleted

## 2021-03-27 ENCOUNTER — Other Ambulatory Visit: Payer: Self-pay

## 2021-03-27 DIAGNOSIS — U099 Post covid-19 condition, unspecified: Secondary | ICD-10-CM

## 2021-03-27 DIAGNOSIS — J9621 Acute and chronic respiratory failure with hypoxia: Secondary | ICD-10-CM

## 2021-03-27 NOTE — Progress Notes (Signed)
Virtual orientation call completed today. he has an appointment on Date: 03/28/2021  for EP eval and gym Orientation.  Documentation of diagnosis can be found in [facility] Date: 03/28/2021 scanned .

## 2021-03-29 ENCOUNTER — Ambulatory Visit: Payer: Medicaid Other

## 2021-04-04 ENCOUNTER — Ambulatory Visit: Payer: Medicaid Other | Attending: Pulmonary Disease

## 2021-04-10 ENCOUNTER — Ambulatory Visit: Payer: Medicaid Other

## 2021-06-22 IMAGING — CR DG ORBITS FOR FOREIGN BODY
2 series · 2 of 2 positions shown · non-contrast
Comparison: None.

CLINICAL DATA: Metal working/exposure; clearance prior to MRI

EXAM:
ORBITS FOR FOREIGN BODY - 2 VIEW

[orbits waters (1 of 2)]
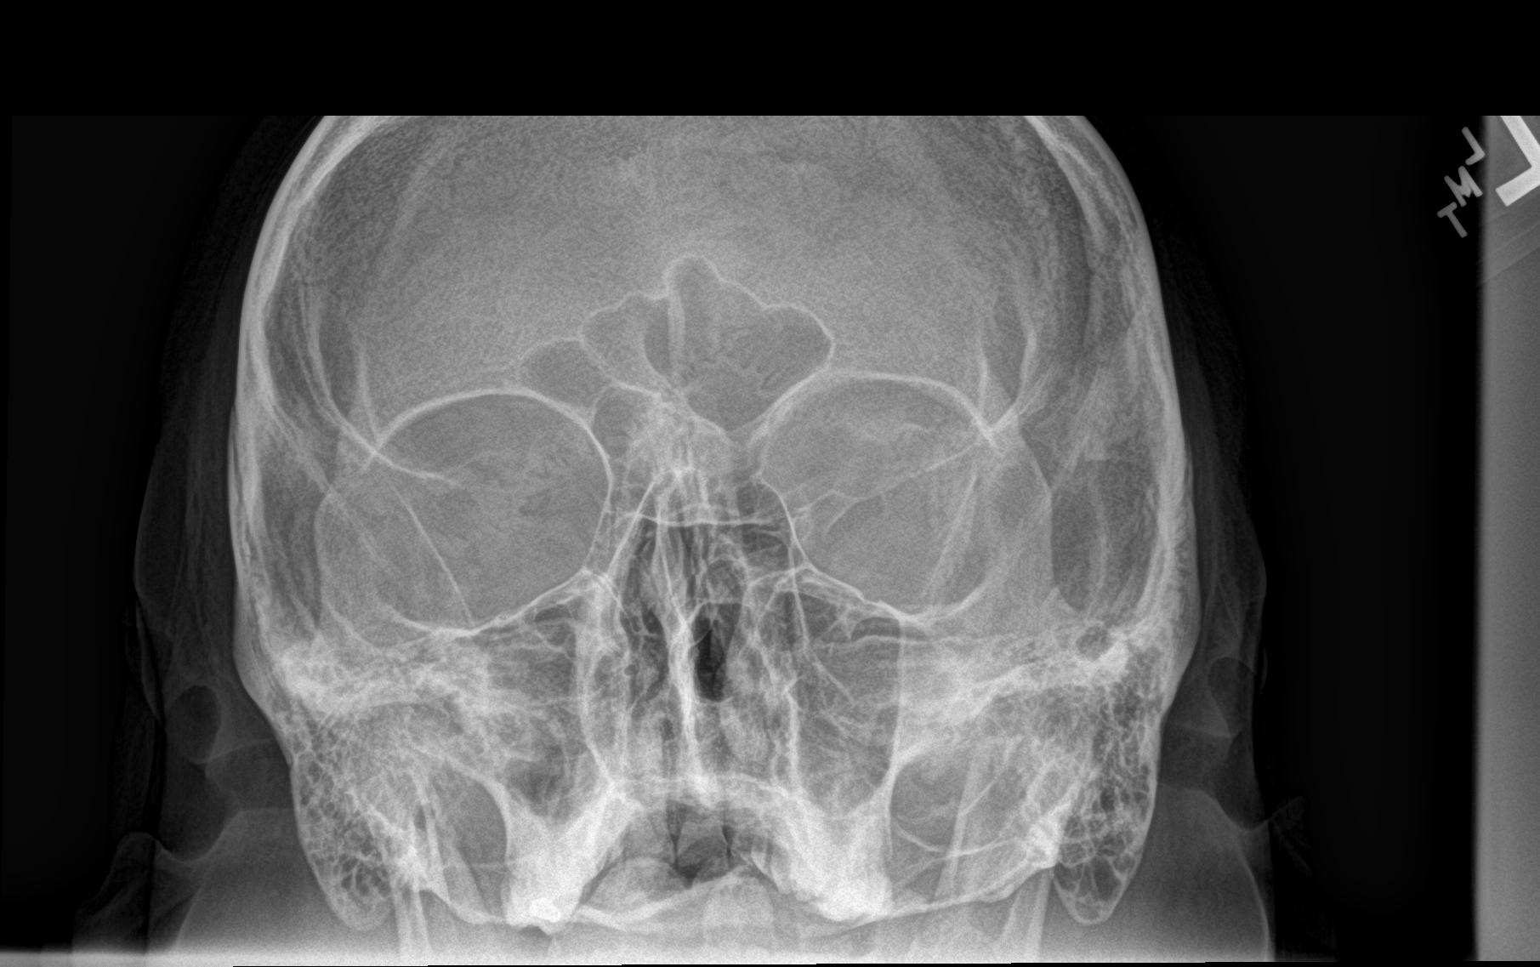

[orbits waters (2 of 2)]
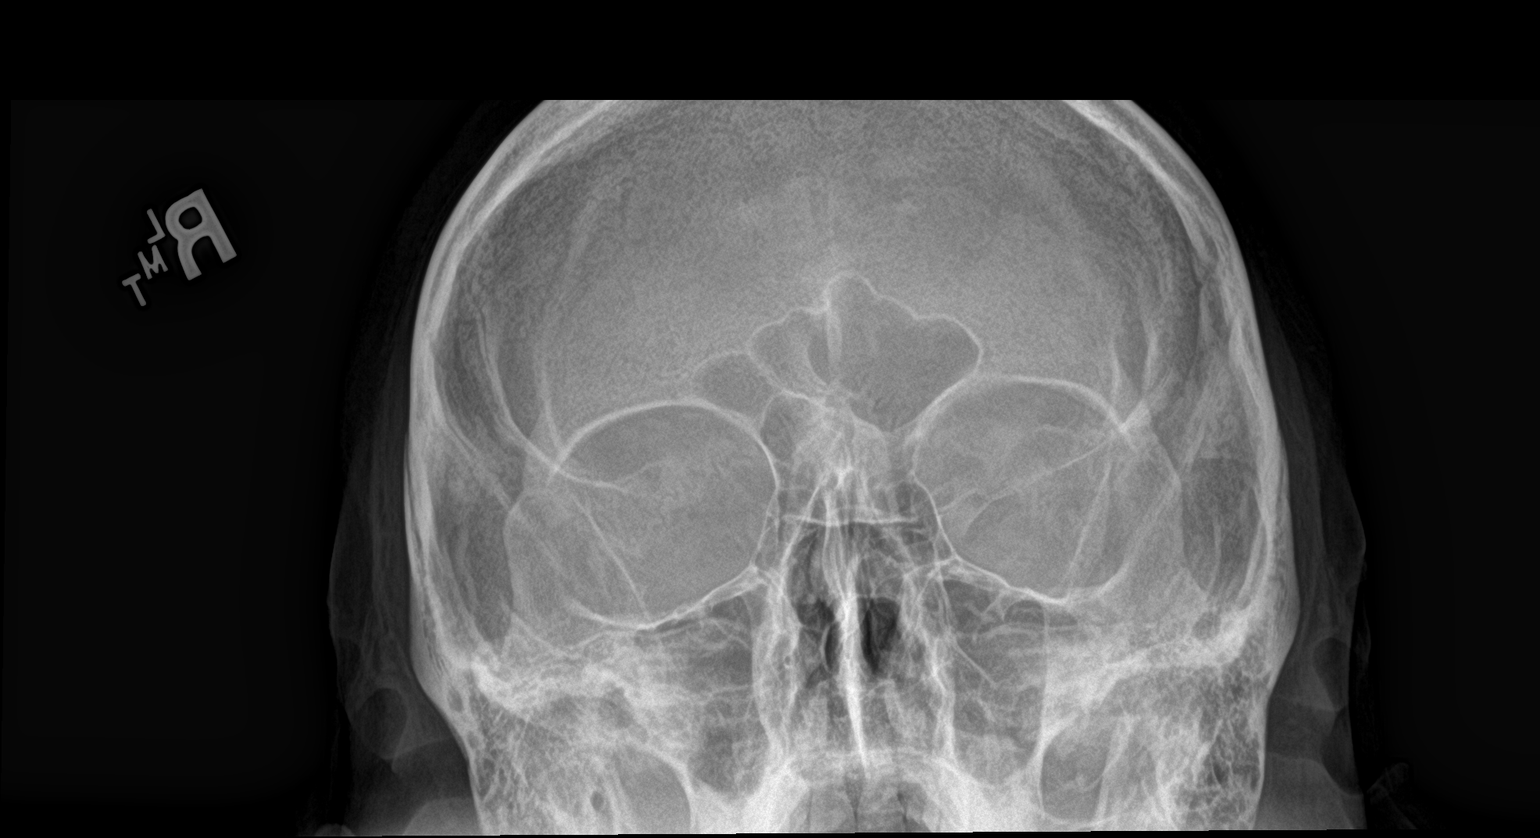

[2 of 2 positions shown; findings below may reference images not displayed]

FINDINGS: There is no evidence of metallic foreign body within the orbits. No
significant bone abnormality identified.
IMPRESSION: No evidence of metallic foreign body within the orbits.

## 2021-07-06 ENCOUNTER — Ambulatory Visit: Admission: RE | Admit: 2021-07-06 | Payer: Medicaid Other | Source: Ambulatory Visit

## 2021-07-06 ENCOUNTER — Other Ambulatory Visit: Payer: Self-pay | Admitting: Sports Medicine

## 2021-07-06 DIAGNOSIS — M25561 Pain in right knee: Secondary | ICD-10-CM

## 2021-07-06 DIAGNOSIS — G8929 Other chronic pain: Secondary | ICD-10-CM

## 2021-07-08 ENCOUNTER — Inpatient Hospital Stay
Admission: EM | Admit: 2021-07-08 | Discharge: 2021-07-25 | DRG: 853 | Disposition: A | Discharge status: Home Health | Payer: Medicaid Other | Attending: Internal Medicine | Admitting: Internal Medicine

## 2021-07-08 ENCOUNTER — Emergency Department: Payer: Medicaid Other

## 2021-07-08 ENCOUNTER — Inpatient Hospital Stay: Payer: Self-pay

## 2021-07-08 ENCOUNTER — Other Ambulatory Visit: Payer: Self-pay

## 2021-07-08 ENCOUNTER — Inpatient Hospital Stay: Payer: Medicaid Other

## 2021-07-08 DIAGNOSIS — R569 Unspecified convulsions: Secondary | ICD-10-CM | POA: Diagnosis present

## 2021-07-08 DIAGNOSIS — J9601 Acute respiratory failure with hypoxia: Secondary | ICD-10-CM

## 2021-07-08 DIAGNOSIS — R7881 Bacteremia: Secondary | ICD-10-CM

## 2021-07-08 DIAGNOSIS — Z7901 Long term (current) use of anticoagulants: Secondary | ICD-10-CM

## 2021-07-08 DIAGNOSIS — Z880 Allergy status to penicillin: Secondary | ICD-10-CM

## 2021-07-08 DIAGNOSIS — E871 Hypo-osmolality and hyponatremia: Secondary | ICD-10-CM | POA: Diagnosis present

## 2021-07-08 DIAGNOSIS — I10 Essential (primary) hypertension: Secondary | ICD-10-CM | POA: Diagnosis present

## 2021-07-08 DIAGNOSIS — G928 Other toxic encephalopathy: Secondary | ICD-10-CM | POA: Diagnosis present

## 2021-07-08 DIAGNOSIS — J9622 Acute and chronic respiratory failure with hypercapnia: Secondary | ICD-10-CM | POA: Diagnosis present

## 2021-07-08 DIAGNOSIS — G6281 Critical illness polyneuropathy: Secondary | ICD-10-CM | POA: Diagnosis present

## 2021-07-08 DIAGNOSIS — Z86718 Personal history of other venous thrombosis and embolism: Secondary | ICD-10-CM | POA: Diagnosis not present

## 2021-07-08 DIAGNOSIS — T4275XA Adverse effect of unspecified antiepileptic and sedative-hypnotic drugs, initial encounter: Secondary | ICD-10-CM | POA: Diagnosis present

## 2021-07-08 DIAGNOSIS — M109 Gout, unspecified: Secondary | ICD-10-CM | POA: Diagnosis present

## 2021-07-08 DIAGNOSIS — Z794 Long term (current) use of insulin: Secondary | ICD-10-CM

## 2021-07-08 DIAGNOSIS — E872 Acidosis, unspecified: Secondary | ICD-10-CM | POA: Diagnosis present

## 2021-07-08 DIAGNOSIS — Z20822 Contact with and (suspected) exposure to covid-19: Secondary | ICD-10-CM | POA: Diagnosis present

## 2021-07-08 DIAGNOSIS — G4733 Obstructive sleep apnea (adult) (pediatric): Secondary | ICD-10-CM | POA: Diagnosis present

## 2021-07-08 DIAGNOSIS — A419 Sepsis, unspecified organism: Secondary | ICD-10-CM

## 2021-07-08 DIAGNOSIS — Y92239 Unspecified place in hospital as the place of occurrence of the external cause: Secondary | ICD-10-CM | POA: Diagnosis present

## 2021-07-08 DIAGNOSIS — Z8616 Personal history of COVID-19: Secondary | ICD-10-CM | POA: Diagnosis not present

## 2021-07-08 DIAGNOSIS — L8990 Pressure ulcer of unspecified site, unspecified stage: Secondary | ICD-10-CM | POA: Diagnosis present

## 2021-07-08 DIAGNOSIS — R0602 Shortness of breath: Secondary | ICD-10-CM

## 2021-07-08 DIAGNOSIS — T3995XA Adverse effect of unspecified nonopioid analgesic, antipyretic and antirheumatic, initial encounter: Secondary | ICD-10-CM | POA: Diagnosis present

## 2021-07-08 DIAGNOSIS — J15211 Pneumonia due to Methicillin susceptible Staphylococcus aureus: Secondary | ICD-10-CM | POA: Diagnosis present

## 2021-07-08 DIAGNOSIS — A4101 Sepsis due to Methicillin susceptible Staphylococcus aureus: Secondary | ICD-10-CM | POA: Diagnosis present

## 2021-07-08 DIAGNOSIS — Z79899 Other long term (current) drug therapy: Secondary | ICD-10-CM

## 2021-07-08 DIAGNOSIS — E8809 Other disorders of plasma-protein metabolism, not elsewhere classified: Secondary | ICD-10-CM | POA: Diagnosis not present

## 2021-07-08 DIAGNOSIS — E1165 Type 2 diabetes mellitus with hyperglycemia: Secondary | ICD-10-CM | POA: Diagnosis not present

## 2021-07-08 DIAGNOSIS — Z4659 Encounter for fitting and adjustment of other gastrointestinal appliance and device: Secondary | ICD-10-CM

## 2021-07-08 DIAGNOSIS — Z7952 Long term (current) use of systemic steroids: Secondary | ICD-10-CM

## 2021-07-08 DIAGNOSIS — J9621 Acute and chronic respiratory failure with hypoxia: Secondary | ICD-10-CM

## 2021-07-08 DIAGNOSIS — E873 Alkalosis: Secondary | ICD-10-CM | POA: Diagnosis not present

## 2021-07-08 DIAGNOSIS — B9561 Methicillin susceptible Staphylococcus aureus infection as the cause of diseases classified elsewhere: Secondary | ICD-10-CM | POA: Diagnosis not present

## 2021-07-08 DIAGNOSIS — E876 Hypokalemia: Secondary | ICD-10-CM | POA: Diagnosis present

## 2021-07-08 DIAGNOSIS — E8770 Fluid overload, unspecified: Secondary | ICD-10-CM | POA: Diagnosis present

## 2021-07-08 DIAGNOSIS — F17200 Nicotine dependence, unspecified, uncomplicated: Secondary | ICD-10-CM | POA: Diagnosis present

## 2021-07-08 DIAGNOSIS — M009 Pyogenic arthritis, unspecified: Secondary | ICD-10-CM | POA: Diagnosis not present

## 2021-07-08 DIAGNOSIS — R578 Other shock: Secondary | ICD-10-CM | POA: Diagnosis present

## 2021-07-08 DIAGNOSIS — D849 Immunodeficiency, unspecified: Secondary | ICD-10-CM | POA: Diagnosis present

## 2021-07-08 DIAGNOSIS — Z7401 Bed confinement status: Secondary | ICD-10-CM

## 2021-07-08 DIAGNOSIS — M25512 Pain in left shoulder: Secondary | ICD-10-CM | POA: Diagnosis present

## 2021-07-08 DIAGNOSIS — M00061 Staphylococcal arthritis, right knee: Secondary | ICD-10-CM | POA: Diagnosis present

## 2021-07-08 DIAGNOSIS — R21 Rash and other nonspecific skin eruption: Secondary | ICD-10-CM | POA: Diagnosis present

## 2021-07-08 DIAGNOSIS — Z01818 Encounter for other preprocedural examination: Secondary | ICD-10-CM

## 2021-07-08 DIAGNOSIS — R0902 Hypoxemia: Secondary | ICD-10-CM

## 2021-07-08 DIAGNOSIS — Z6841 Body Mass Index (BMI) 40.0 and over, adult: Secondary | ICD-10-CM

## 2021-07-08 DIAGNOSIS — M65861 Other synovitis and tenosynovitis, right lower leg: Secondary | ICD-10-CM | POA: Diagnosis present

## 2021-07-08 DIAGNOSIS — M064 Inflammatory polyarthropathy: Secondary | ICD-10-CM | POA: Diagnosis present

## 2021-07-08 DIAGNOSIS — Z9981 Dependence on supplemental oxygen: Secondary | ICD-10-CM

## 2021-07-08 DIAGNOSIS — Z9049 Acquired absence of other specified parts of digestive tract: Secondary | ICD-10-CM

## 2021-07-08 DIAGNOSIS — Z933 Colostomy status: Secondary | ICD-10-CM

## 2021-07-08 DIAGNOSIS — U071 COVID-19: Secondary | ICD-10-CM

## 2021-07-08 DIAGNOSIS — R6520 Severe sepsis without septic shock: Secondary | ICD-10-CM

## 2021-07-08 DIAGNOSIS — F419 Anxiety disorder, unspecified: Secondary | ICD-10-CM | POA: Diagnosis present

## 2021-07-08 DIAGNOSIS — L8992 Pressure ulcer of unspecified site, stage 2: Secondary | ICD-10-CM | POA: Diagnosis present

## 2021-07-08 LAB — URINALYSIS, COMPLETE (UACMP) WITH MICROSCOPIC
Bacteria, UA: NONE SEEN
Bilirubin Urine: NEGATIVE
Glucose, UA: >=500 mg/dL — AB
Hgb urine dipstick: NEGATIVE
Ketones, ur: 80 mg/dL — AB
Leukocytes,Ua: NEGATIVE
Nitrite: NEGATIVE
Protein, ur: 30 mg/dL — AB
Specific Gravity, Urine: 1.01 (ref 1.005–1.030)
Squamous Epithelial / HPF: NONE SEEN (ref 0–5)
pH: 8 (ref 5.0–8.0)

## 2021-07-08 LAB — BLOOD GAS, ARTERIAL
Acid-Base Excess: 20.5 mmol/L — ABNORMAL HIGH (ref 0.0–2.0)
Bicarbonate: 47.7 mmol/L — ABNORMAL HIGH (ref 20.0–28.0)
FIO2: 0.45
MECHVT: 550 mL
Mechanical Rate: 18
O2 Saturation: 98 %
Patient temperature: 37
RATE: 18 resp/min
pCO2 arterial: 67 mmHg (ref 32.0–48.0)
pH, Arterial: 7.46 — ABNORMAL HIGH (ref 7.350–7.450)
pO2, Arterial: 99 mmHg (ref 83.0–108.0)

## 2021-07-08 LAB — CBC WITH DIFFERENTIAL/PLATELET
Abs Immature Granulocytes: 0.17 10*3/uL — ABNORMAL HIGH (ref 0.00–0.07)
Basophils Absolute: 0 10*3/uL (ref 0.0–0.1)
Basophils Relative: 0 %
Eosinophils Absolute: 0 10*3/uL (ref 0.0–0.5)
Eosinophils Relative: 0 %
HCT: 33.6 % — ABNORMAL LOW (ref 39.0–52.0)
Hemoglobin: 9.9 g/dL — ABNORMAL LOW (ref 13.0–17.0)
Immature Granulocytes: 2 %
Lymphocytes Relative: 9 %
Lymphs Abs: 0.9 10*3/uL (ref 0.7–4.0)
MCH: 28.3 pg (ref 26.0–34.0)
MCHC: 29.5 g/dL — ABNORMAL LOW (ref 30.0–36.0)
MCV: 96 fL (ref 80.0–100.0)
Monocytes Absolute: 1.2 10*3/uL — ABNORMAL HIGH (ref 0.1–1.0)
Monocytes Relative: 13 %
Neutro Abs: 7.4 10*3/uL (ref 1.7–7.7)
Neutrophils Relative %: 76 %
Platelets: 201 10*3/uL (ref 150–400)
RBC: 3.5 MIL/uL — ABNORMAL LOW (ref 4.22–5.81)
RDW: 18.4 % — ABNORMAL HIGH (ref 11.5–15.5)
WBC: 9.8 10*3/uL (ref 4.0–10.5)
nRBC: 0.5 % — ABNORMAL HIGH (ref 0.0–0.2)

## 2021-07-08 LAB — COMPREHENSIVE METABOLIC PANEL
ALT: 44 U/L (ref 0–44)
AST: 30 U/L (ref 15–41)
Albumin: 3 g/dL — ABNORMAL LOW (ref 3.5–5.0)
Alkaline Phosphatase: 56 U/L (ref 38–126)
Anion gap: 9 (ref 5–15)
BUN: <5 mg/dL — ABNORMAL LOW (ref 6–20)
CO2: 41 mmol/L — ABNORMAL HIGH (ref 22–32)
Calcium: 9 mg/dL (ref 8.9–10.3)
Chloride: 84 mmol/L — ABNORMAL LOW (ref 98–111)
Creatinine, Ser: 0.5 mg/dL — ABNORMAL LOW (ref 0.61–1.24)
GFR, Estimated: >60 mL/min (ref >60)
Glucose, Bld: 287 mg/dL — ABNORMAL HIGH (ref 70–99)
Potassium: 3.4 mmol/L — ABNORMAL LOW (ref 3.5–5.1)
Sodium: 134 mmol/L — ABNORMAL LOW (ref 135–145)
Total Bilirubin: 1.2 mg/dL (ref 0.3–1.2)
Total Protein: 6.4 g/dL — ABNORMAL LOW (ref 6.5–8.1)

## 2021-07-08 LAB — BLOOD GAS, VENOUS
Acid-Base Excess: 18.5 mmol/L — ABNORMAL HIGH (ref 0.0–2.0)
Bicarbonate: 45.5 mmol/L — ABNORMAL HIGH (ref 20.0–28.0)
O2 Saturation: 85.7 %
Patient temperature: 37
pCO2, Ven: 67 mmHg — ABNORMAL HIGH (ref 44.0–60.0)
pH, Ven: 7.44 — ABNORMAL HIGH (ref 7.250–7.430)
pO2, Ven: 49 mmHg — ABNORMAL HIGH (ref 32.0–45.0)

## 2021-07-08 LAB — LACTIC ACID, PLASMA
Lactic Acid, Venous: 3.5 mmol/L (ref 0.5–1.9)
Lactic Acid, Venous: 1.9 mmol/L (ref 0.5–1.9)

## 2021-07-08 LAB — URINE DRUG SCREEN, QUALITATIVE (ARMC ONLY)
Amphetamines, Ur Screen: NOT DETECTED
Barbiturates, Ur Screen: NOT DETECTED
Benzodiazepine, Ur Scrn: NOT DETECTED
Cannabinoid 50 Ng, Ur ~~LOC~~: NOT DETECTED
Cocaine Metabolite,Ur ~~LOC~~: NOT DETECTED
MDMA (Ecstasy)Ur Screen: NOT DETECTED
Methadone Scn, Ur: NOT DETECTED
Opiate, Ur Screen: POSITIVE — AB
Phencyclidine (PCP) Ur S: NOT DETECTED
Tricyclic, Ur Screen: NOT DETECTED

## 2021-07-08 LAB — TSH: TSH: 1.525 u[IU]/mL (ref 0.350–4.500)

## 2021-07-08 LAB — TROPONIN I (HIGH SENSITIVITY)
Troponin I (High Sensitivity): 55 ng/L — ABNORMAL HIGH (ref <18)
Troponin I (High Sensitivity): 89 ng/L — ABNORMAL HIGH (ref <18)

## 2021-07-08 LAB — RESP PANEL BY RT-PCR (FLU A&B, COVID) ARPGX2
Influenza A by PCR: NEGATIVE
Influenza B by PCR: NEGATIVE
SARS Coronavirus 2 by RT PCR: NEGATIVE

## 2021-07-08 LAB — BRAIN NATRIURETIC PEPTIDE: B Natriuretic Peptide: 28.7 pg/mL (ref 0.0–100.0)

## 2021-07-08 LAB — PROCALCITONIN: Procalcitonin: 0.33 ng/mL

## 2021-07-08 LAB — PROTIME-INR
INR: 1 (ref 0.8–1.2)
Prothrombin Time: 13.6 seconds (ref 11.4–15.2)

## 2021-07-08 LAB — GLUCOSE, CAPILLARY
Glucose-Capillary: 215 mg/dL — ABNORMAL HIGH (ref 70–99)
Glucose-Capillary: 231 mg/dL — ABNORMAL HIGH (ref 70–99)

## 2021-07-08 LAB — MRSA NEXT GEN BY PCR, NASAL: MRSA by PCR Next Gen: NOT DETECTED

## 2021-07-08 LAB — PROTEIN, URINE, RANDOM: Total Protein, Urine: 32 mg/dL

## 2021-07-08 LAB — MAGNESIUM: Magnesium: 1.5 mg/dL — ABNORMAL LOW (ref 1.7–2.4)

## 2021-07-08 LAB — STREP PNEUMONIAE URINARY ANTIGEN: Strep Pneumo Urinary Antigen: NEGATIVE

## 2021-07-08 LAB — PHOSPHORUS: Phosphorus: 1.2 mg/dL — ABNORMAL LOW (ref 2.5–4.6)

## 2021-07-08 LAB — HEPARIN LEVEL (UNFRACTIONATED): Heparin Unfractionated: <0.1 IU/mL — ABNORMAL LOW (ref 0.30–0.70)

## 2021-07-08 LAB — APTT: aPTT: 32 seconds (ref 24–36)

## 2021-07-08 IMAGING — DX DG ABDOMEN 1V
1 series · 1 of 1 positions shown · non-contrast
Comparison: [DATE]

CLINICAL DATA: OG tube placement

EXAM:
ABDOMEN - 1 VIEW

[abdomen supine]
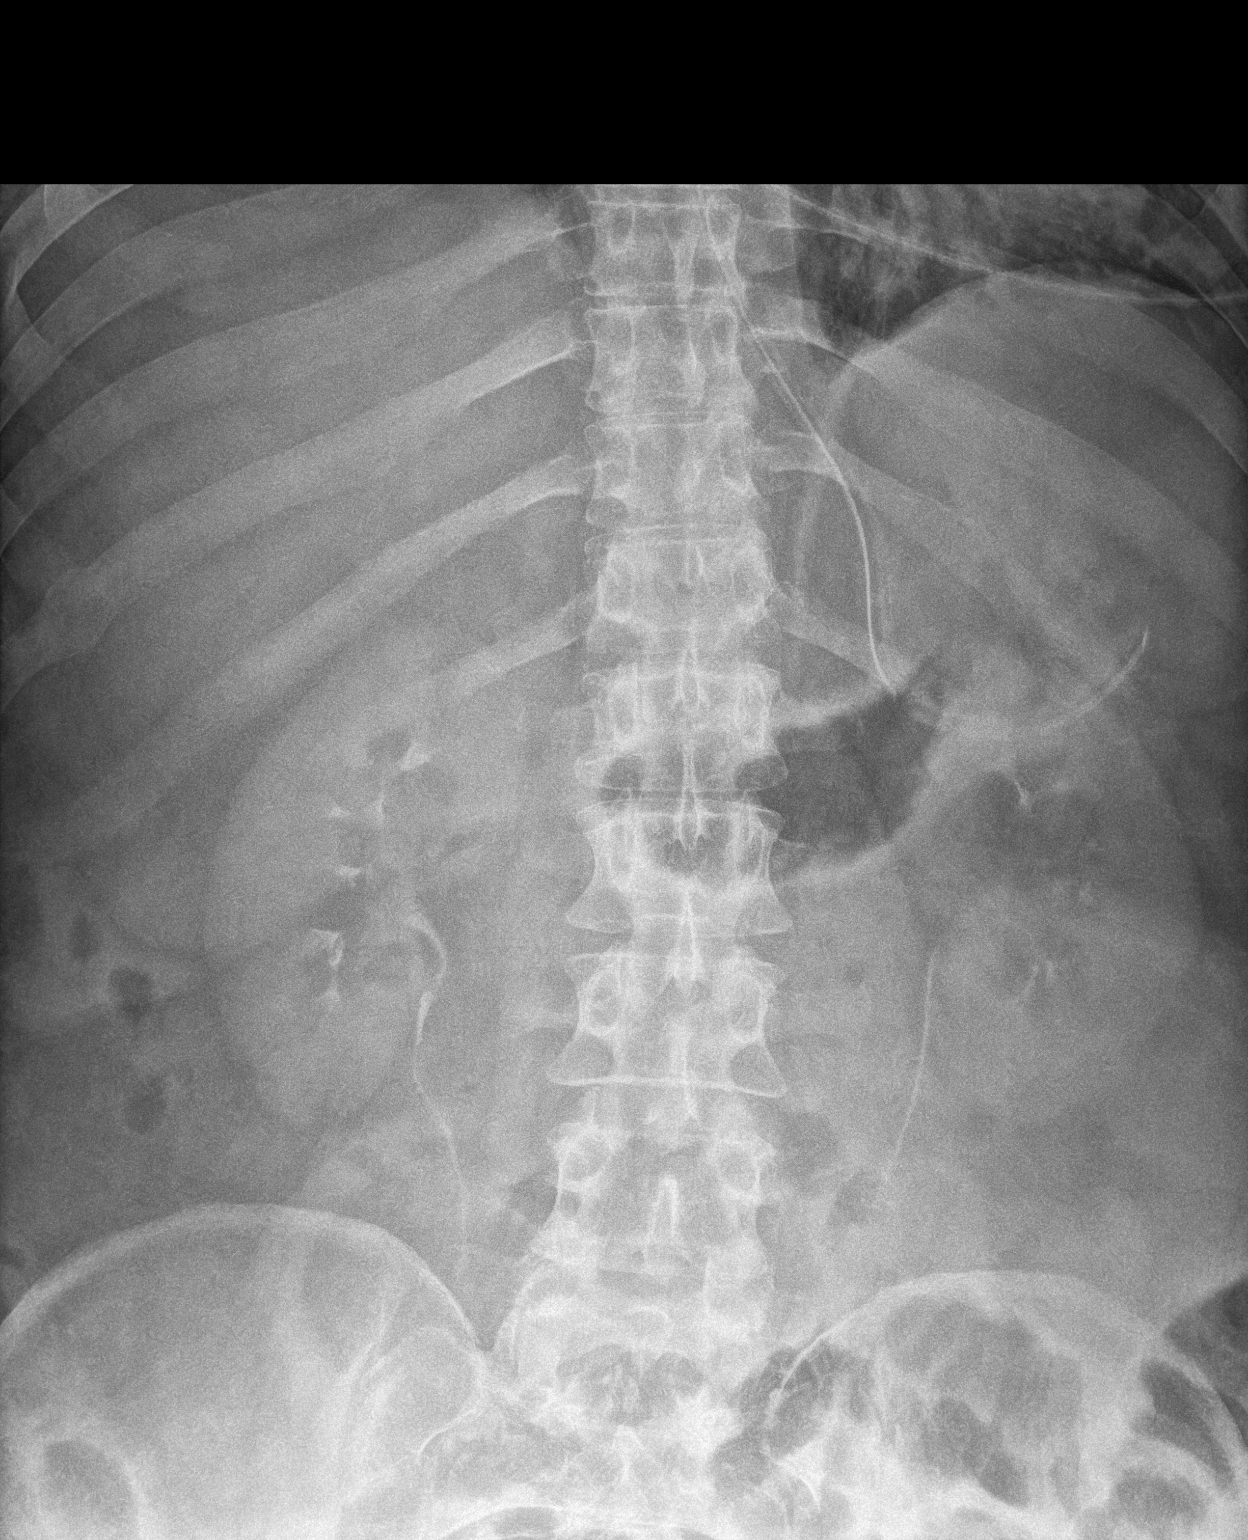

[1 of 1 positions shown; findings below may reference images not displayed]

FINDINGS: Limited radiograph of the lower chest and upper abdomen was obtained
for the purposes of enteric tube localization. Enteric tube is seen
coursing below the diaphragm with distal tip and side port
terminating within the expected location of the gastric body.
Excreted contrast seen within the bilateral kidneys.
IMPRESSION: Enteric tube tip and side port project within the expected location
of the gastric body.

## 2021-07-08 IMAGING — DX DG CHEST 1V PORT
1 series · 1 of 1 positions shown · non-contrast
Comparison: Chest x-ray [DATE]

CLINICAL DATA: Sepsis

EXAM:
PORTABLE CHEST 1 VIEW

[chest ap]
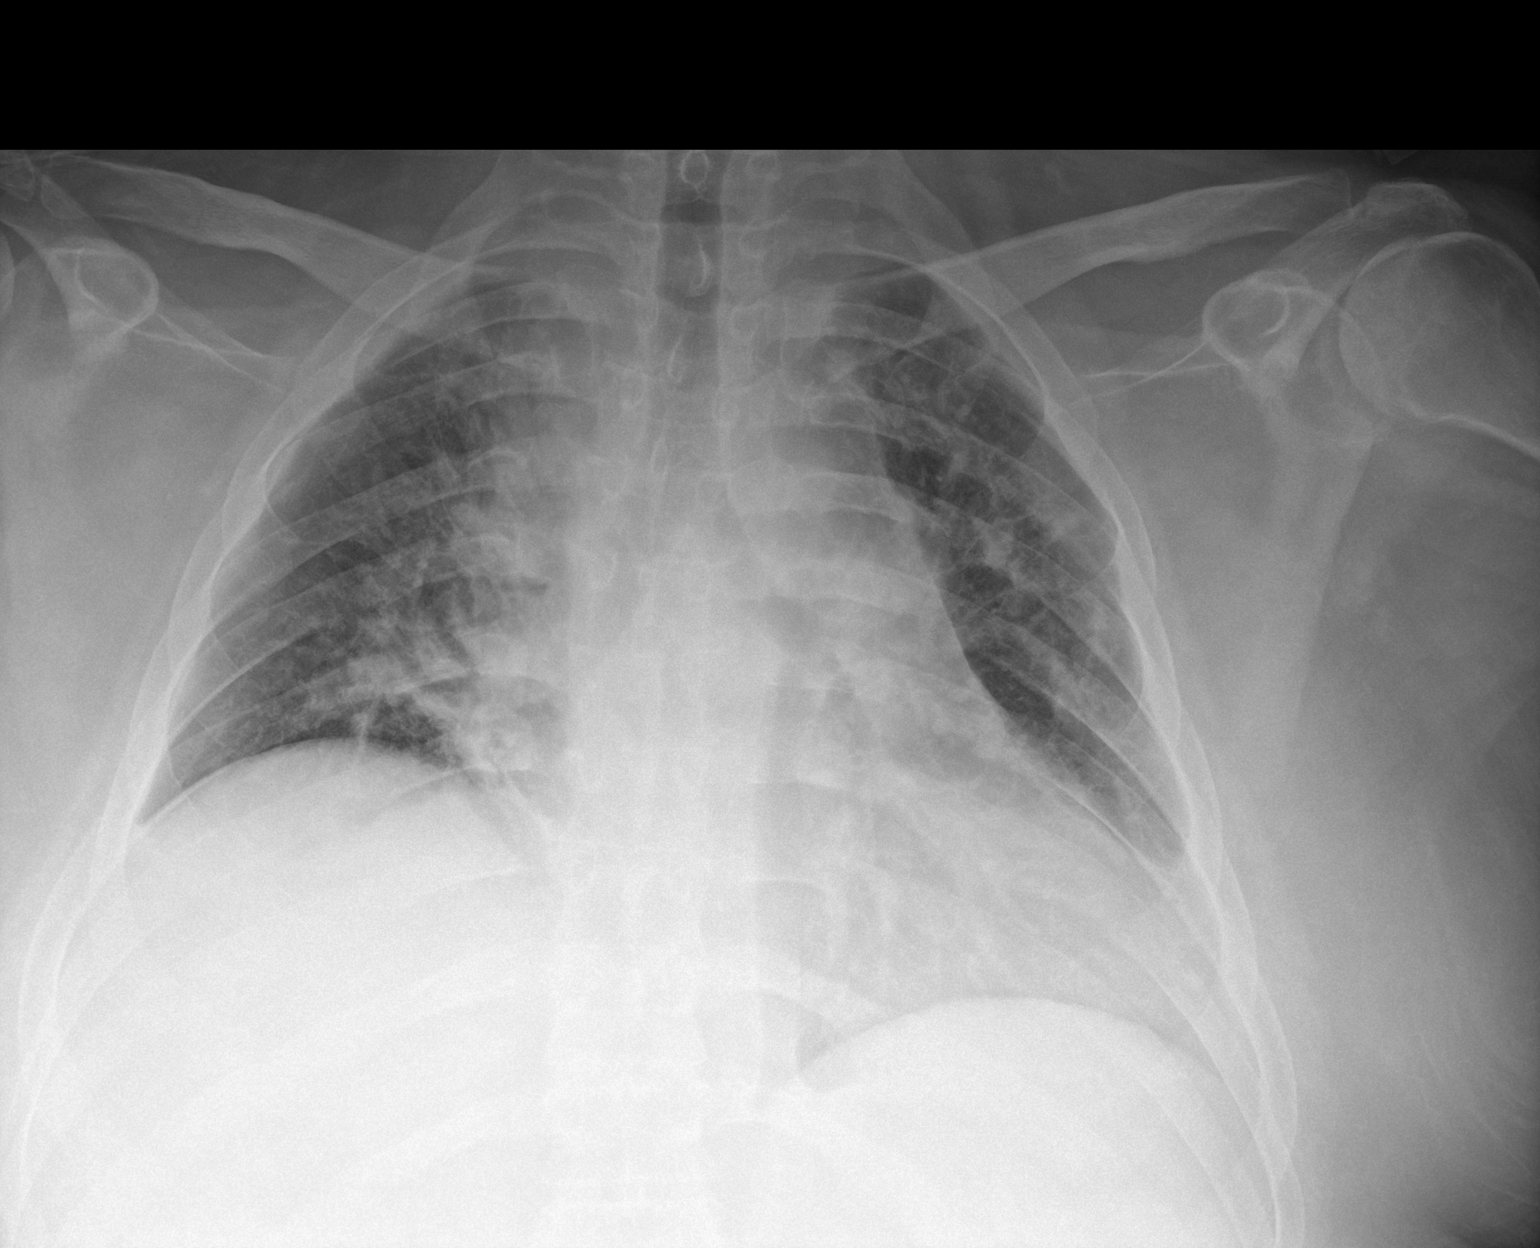

[1 of 1 positions shown; findings below may reference images not displayed]

FINDINGS: Interval removal of the tracheostomy tube. Cardiomediastinal
silhouette appears grossly unchanged. Heart size is upper normal.
Interval improved aeration of the lungs with some persistent
irregular opacities in the right perihilar region and scattered in
the left lung. No large pleural effusion visualized. No
pneumothorax.
IMPRESSION: Mild irregular opacities bilaterally which may be residual/chronic.
Improved aeration of the lungs overall since previous study.

## 2021-07-08 IMAGING — CT CT ANGIO CHEST
2 of 7 series · 18 of 46 positions shown · IV contrast (APPLIED)
Comparison: CT chest [DATE]

CLINICAL DATA: Sepsis

EXAM:
CT ANGIOGRAPHY CHEST WITH CONTRAST
TECHNIQUE: Multidetector CT imaging of the chest was performed using the
standard protocol during bolus administration of intravenous
contrast. Multiplanar CT image reconstructions and MIPs were
obtained to evaluate the vascular anatomy.
CONTRAST:  100mL OMNIPAQUE IOHEXOL 350 MG/ML SOLN

[Series 5: thins · axial · 0.84mm/px · z∈[-627,-363]mm · 15 of 298 slices shown]
[im 17/298  lung]
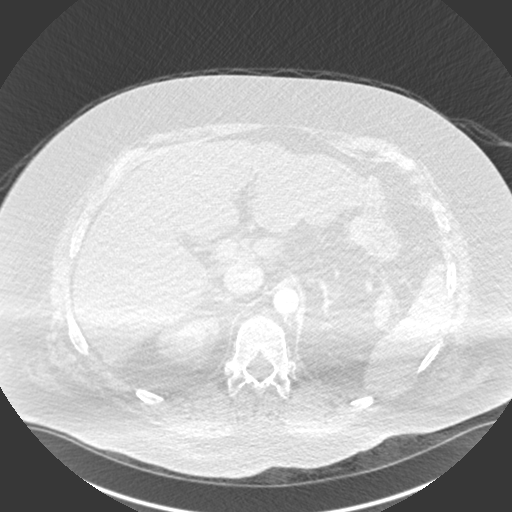
[im 34/298  soft-tissue]
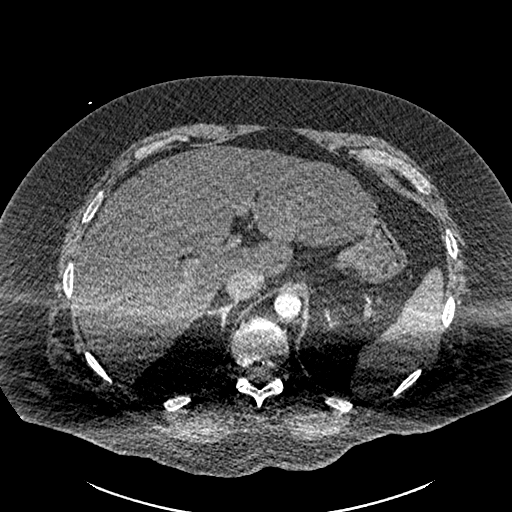
[im 50/298  lung]
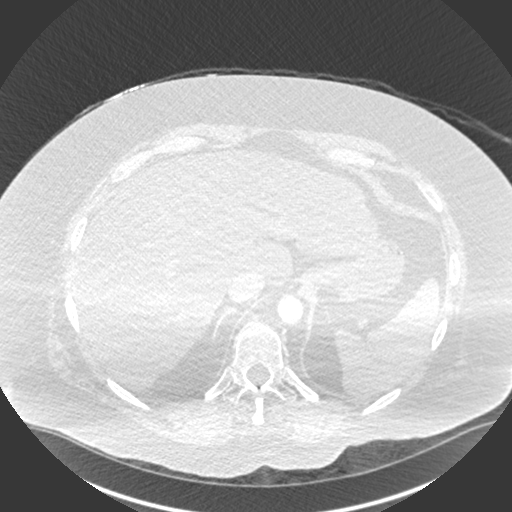
[im 67/298  soft-tissue]
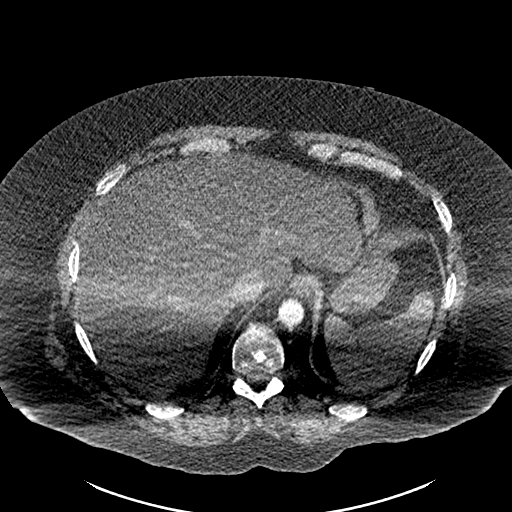
[im 100/298  lung]
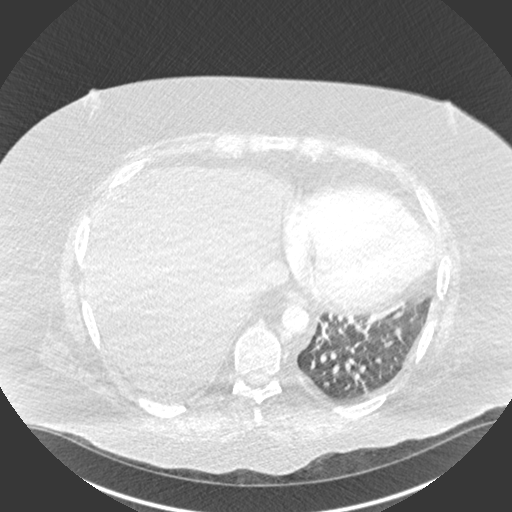
[im 116/298  soft-tissue]
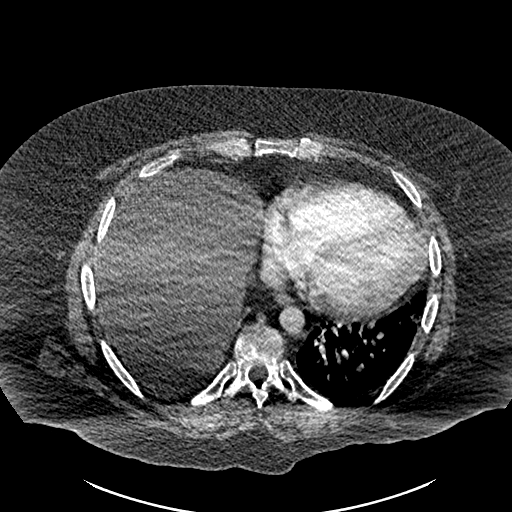
[im 133/298  lung]
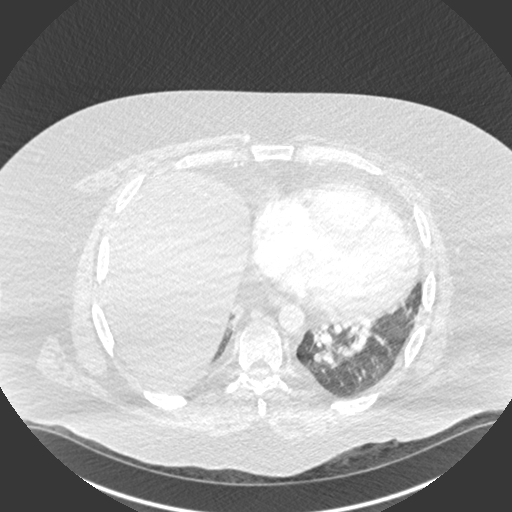
[im 149/298  soft-tissue]
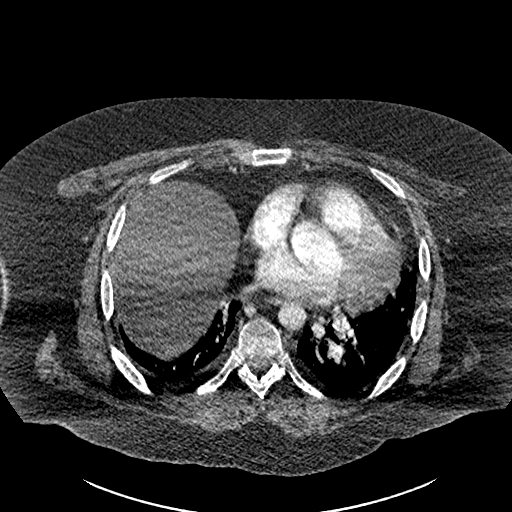
[im 166/298  lung]
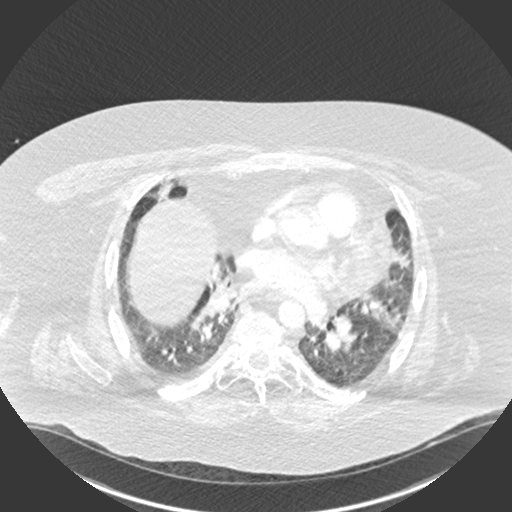
[im 182/298  soft-tissue]
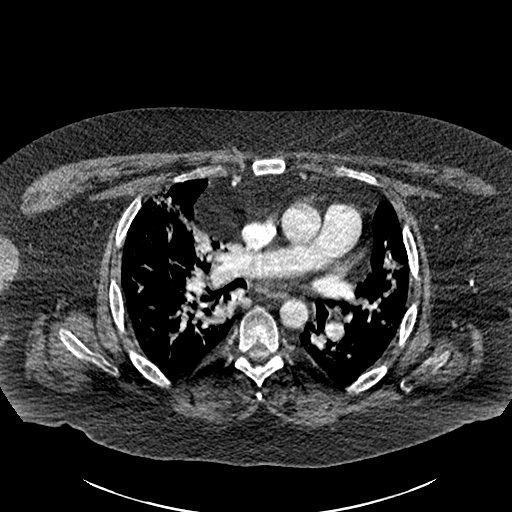
[im 199/298  lung]
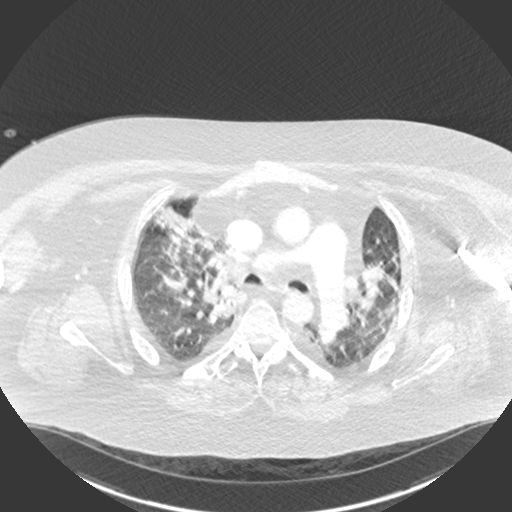
[im 232/298  soft-tissue]
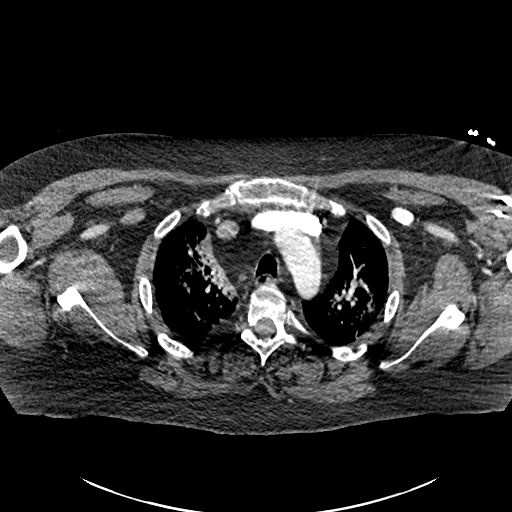
[im 248/298  lung]
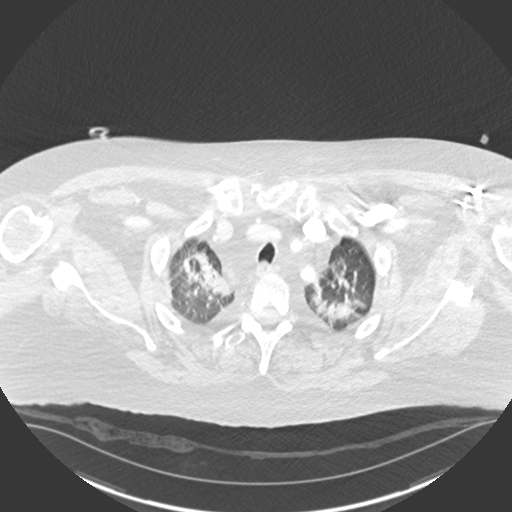
[im 265/298  soft-tissue]
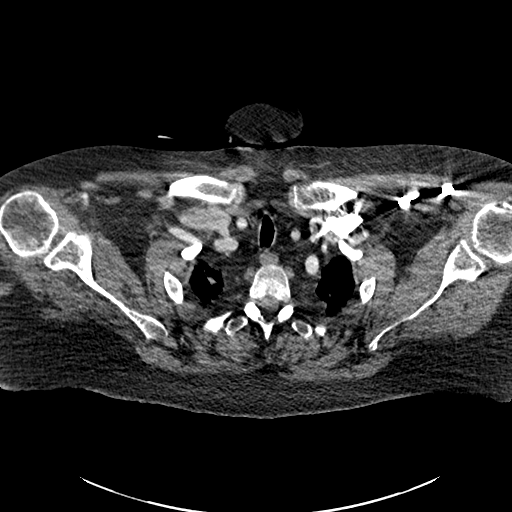
[im 281/298  lung]
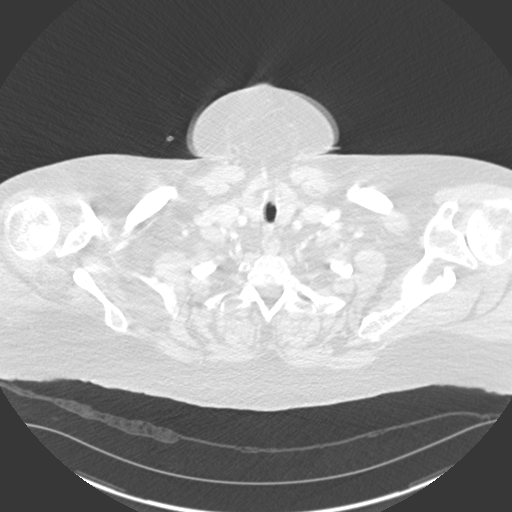

[Series 7: coronal mpr · coronal · 0.58mm/px · 3 of 145 slices shown]
[im 37/145  soft-tissue]
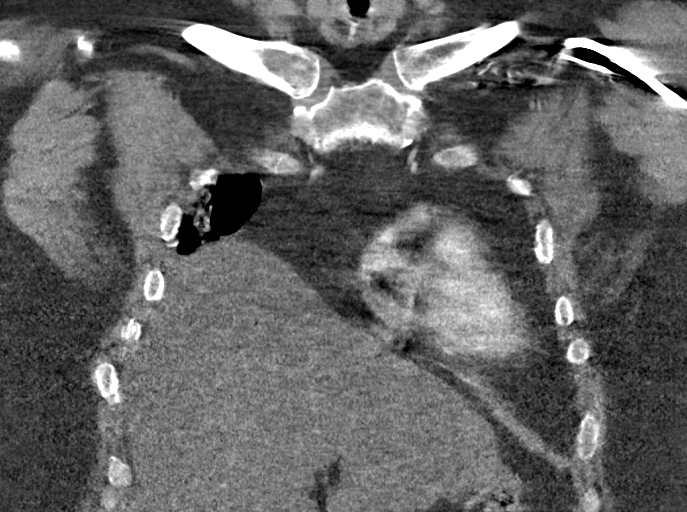
[im 73/145  soft-tissue]
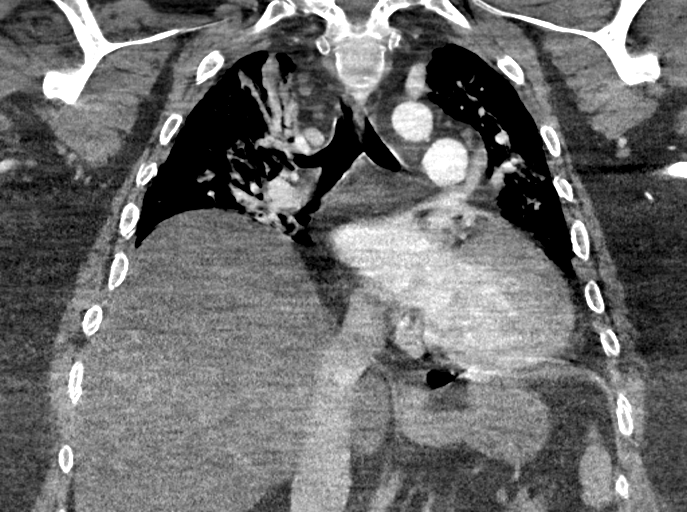
[im 109/145  soft-tissue]
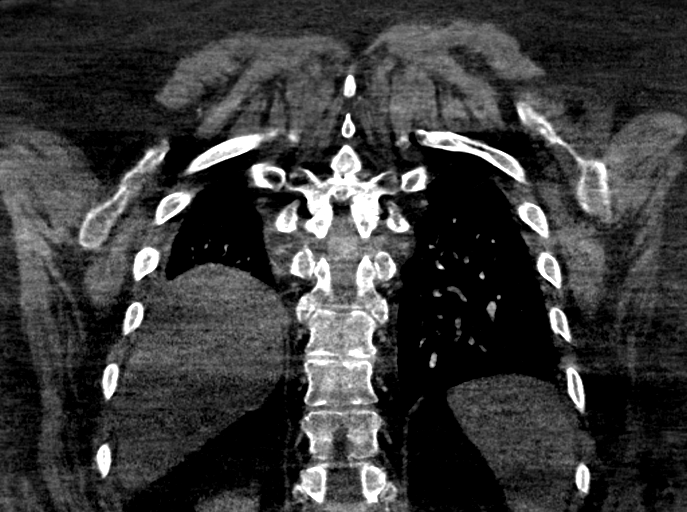

[18 of 46 positions shown; findings below may reference images not displayed]

FINDINGS: Limited study due to motion.

Cardiovascular: No large or central pulmonary embolism identified.
Limited evaluation of segmental and subsegmental branches due to
motion. Main pulmonary artery is normal caliber. Heart size is upper
normal. No pericardial effusion identified. Thoracic aorta is normal
in course and caliber.

Mediastinum/Nodes: No bulky axillary, hilar or mediastinal
lymphadenopathy identified.

Lungs/Pleura: There is similar distribution and appearance of
irregular and consolidative/atelectatic densities bilaterally which
are mostly perihilar on the right and in the upper lobe on the left.
No definite new consolidation identified. No pleural effusion or
pneumothorax.

Upper Abdomen: Hepatomegaly and hepatic steatosis. Elevated right
hemidiaphragm.

Musculoskeletal: No suspicious bony lesions identified.

Review of the MIP images confirms the above findings.
IMPRESSION: 1. No pulmonary embolism identified. Somewhat limited evaluation due
to motion.
2. No acute consolidations/infiltrate identified in the lungs.
Similar appearance and distribution of irregular and consolidative
densities bilaterally.
3. Hepatomegaly and hepatic steatosis.

## 2021-07-08 IMAGING — CT CT HEAD W/O CM
4 series · 16 of 47 positions shown, 18 images · non-contrast
Comparison: CT head [DATE], brain MRI [DATE]

CLINICAL DATA: Seizure, abnormal neuro exam

EXAM:
CT HEAD WITHOUT CONTRAST
TECHNIQUE: Contiguous axial images were obtained from the base of the skull
through the vertex without intravenous contrast.

[Series 2: head wo · axial · 0.44mm/px · z∈[+484,+614]mm · 7 of 36 slices shown, 9 images]
[im 5/36  brain]
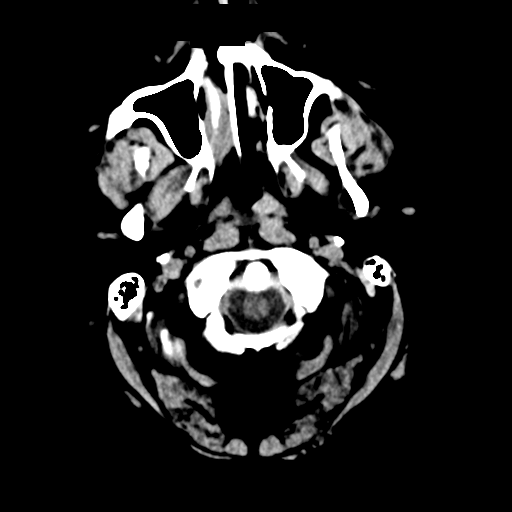
[im 5/36  bone]
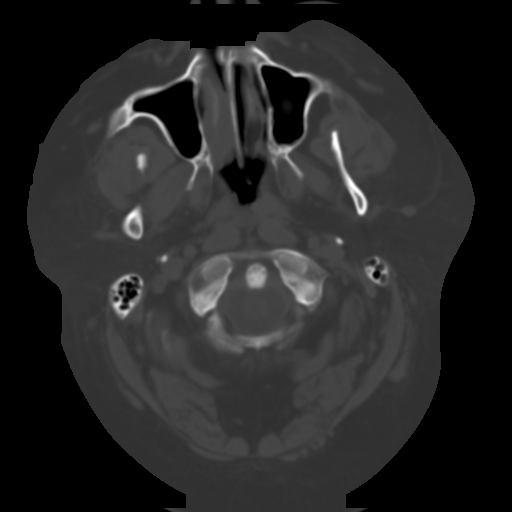
[im 9/36  brain]
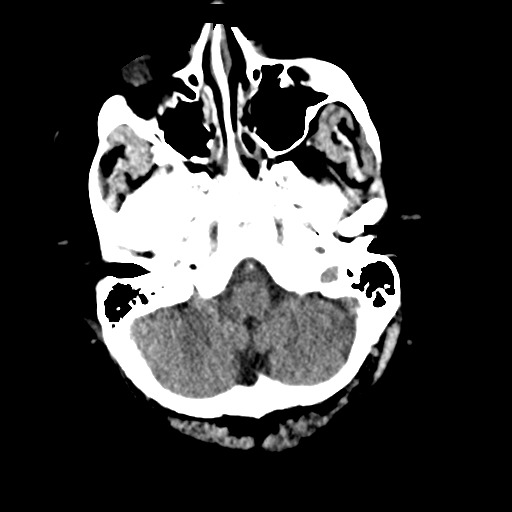
[im 14/36  brain]
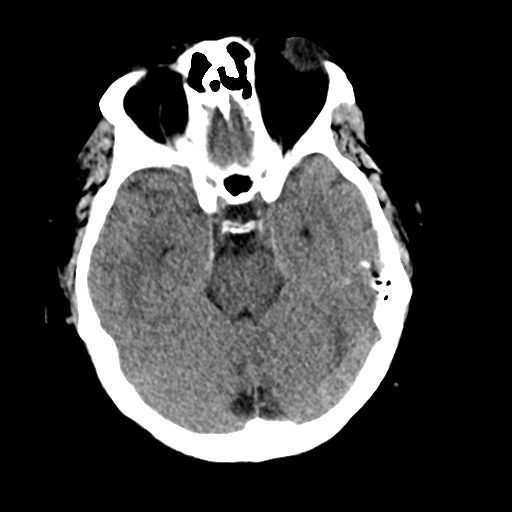
[im 18/36  brain]
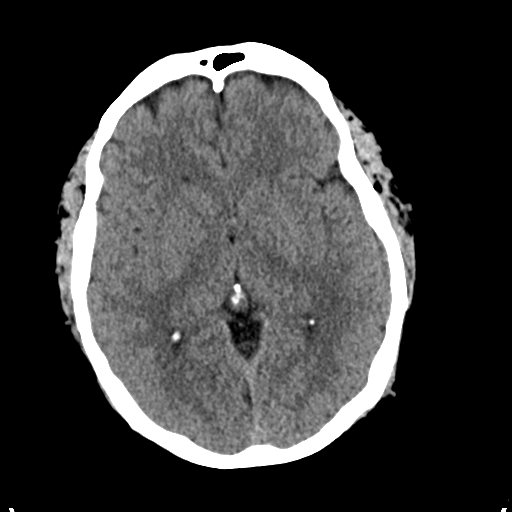
[im 22/36  brain]
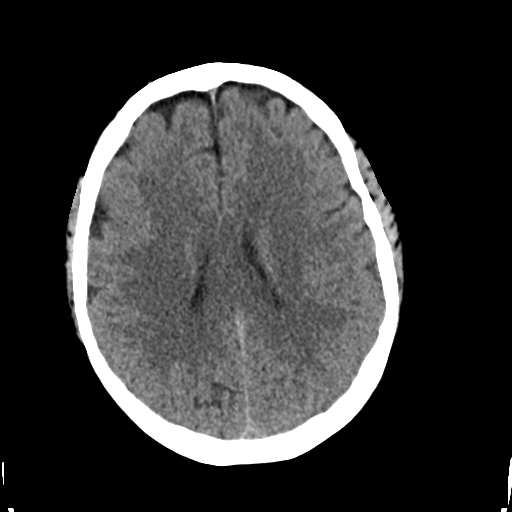
[im 22/36  bone]
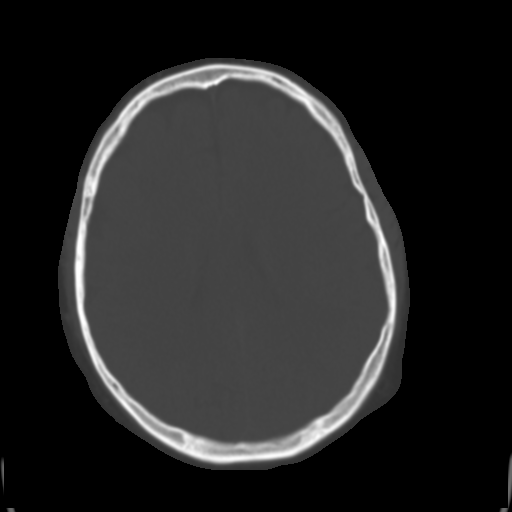
[im 27/36  brain]
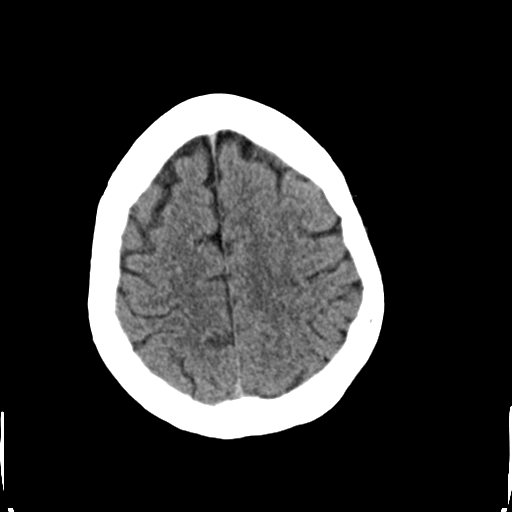
[im 31/36  brain]
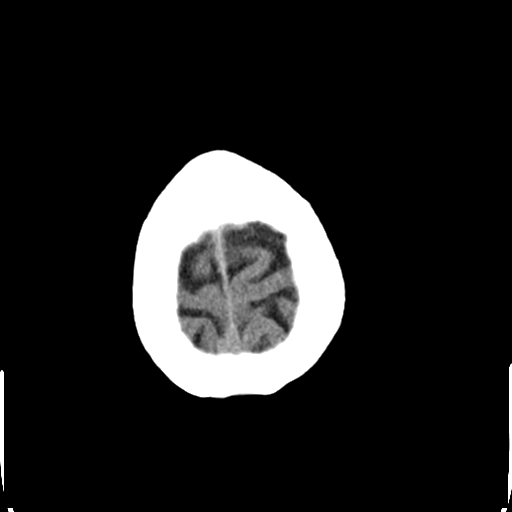

[Series 3: head bone · axial · 0.44mm/px · z∈[+480,+516]mm · 3 of 89 slices shown]
[im 9/89  bone]
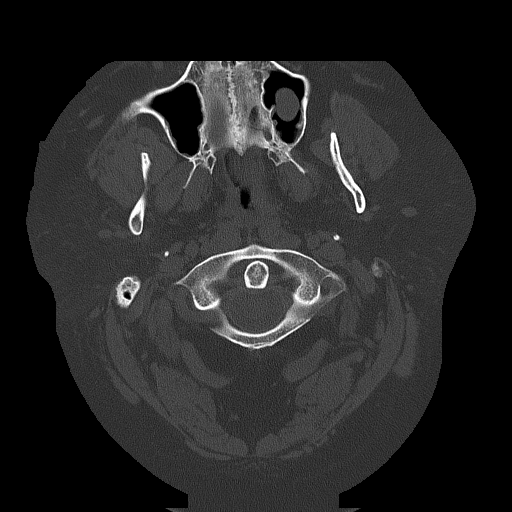
[im 18/89  bone]
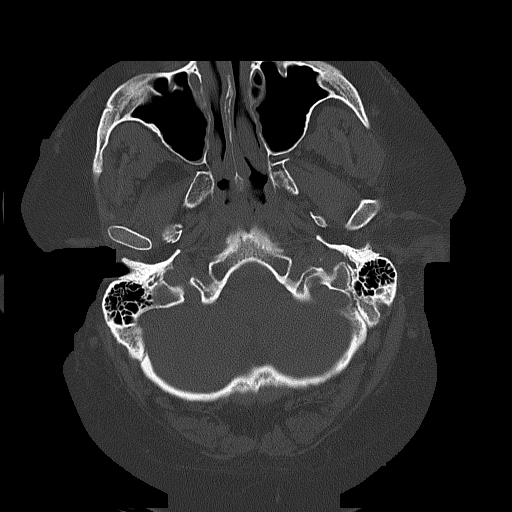
[im 27/89  bone]
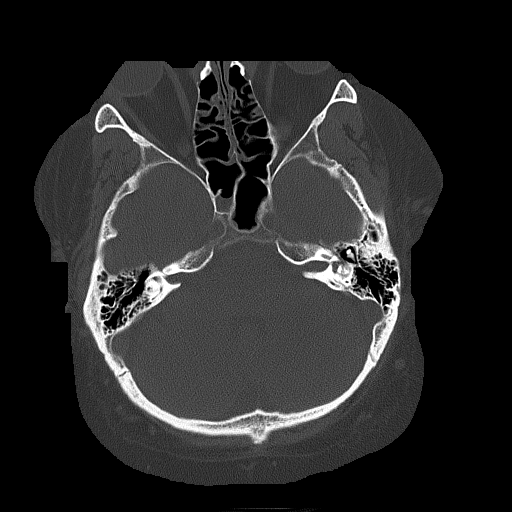

[Series 4: coronal soft tissue · coronal · 0.35mm/px · 3 of 84 slices shown]
[im 28/84  brain]
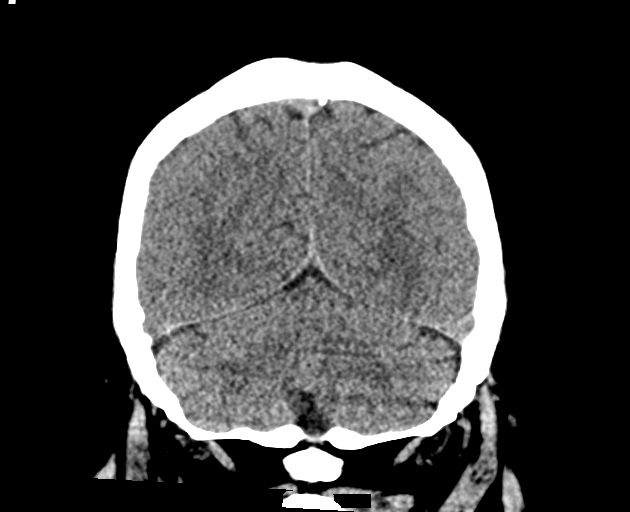
[im 37/84  brain]
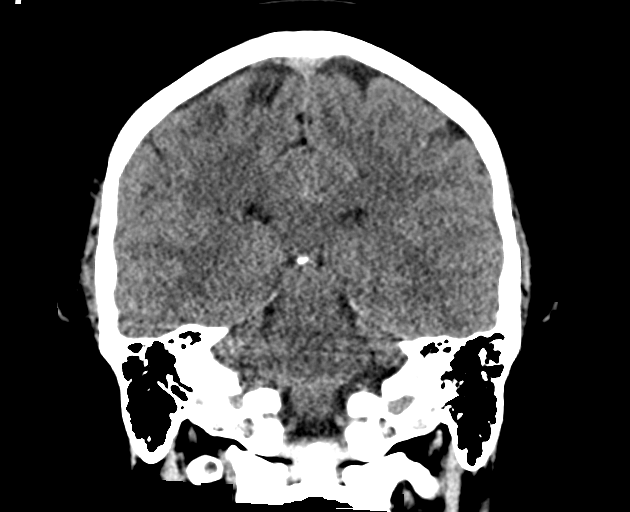
[im 47/84  brain]
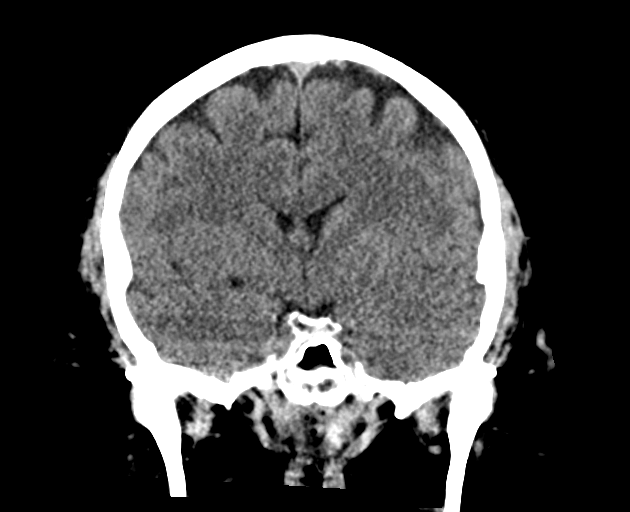

[Series 5: sagittal soft tissue · sagittal · 0.35mm/px · 3 of 73 slices shown]
[im 25/73  brain]
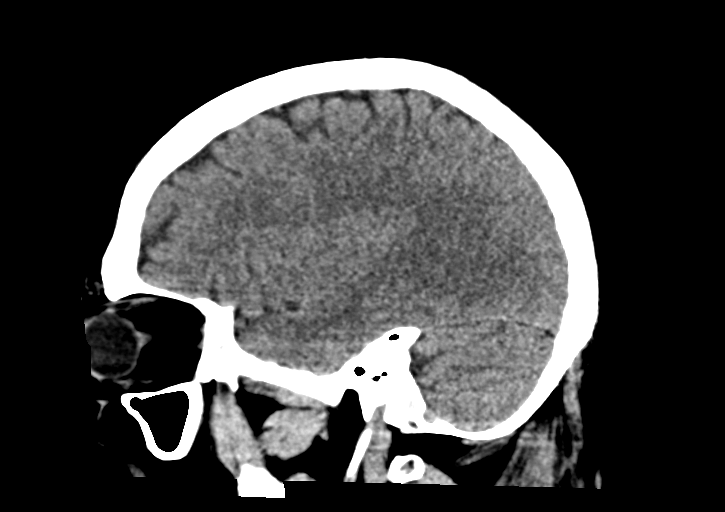
[im 37/73  brain]
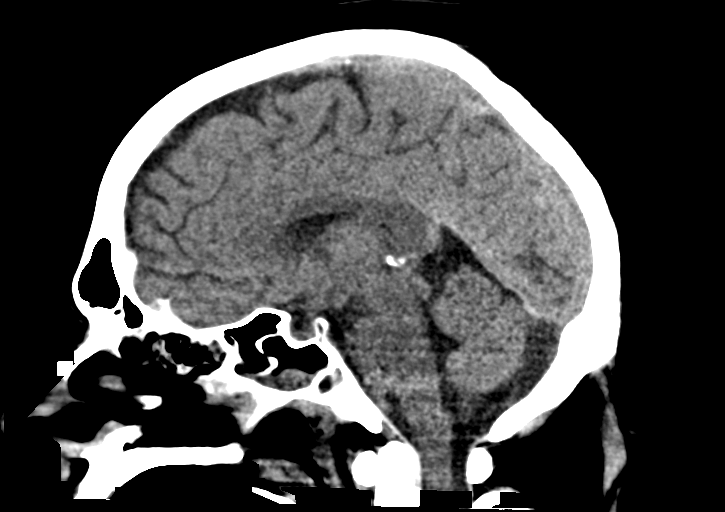
[im 49/73  brain]
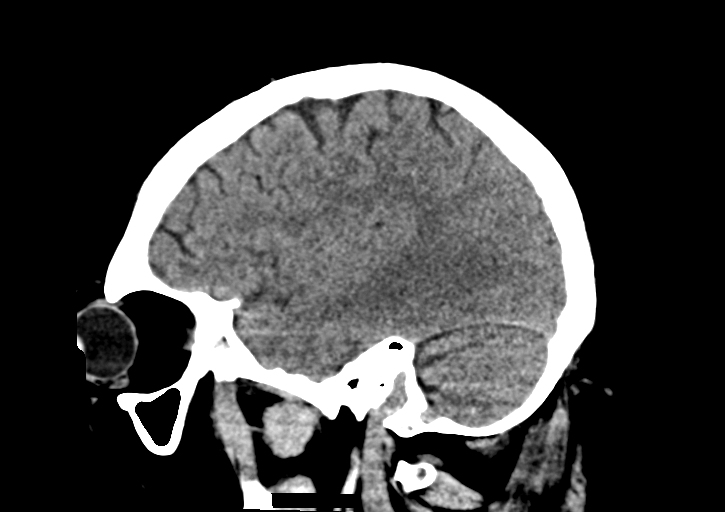

[16 of 47 positions shown; findings below may reference images not displayed]

FINDINGS: Brain: There is no acute intracranial hemorrhage, extra-axial fluid
collection, or acute infarct.

Parenchymal volume is normal. The ventricles are normal in size.
There is no mass lesion. There is no midline shift.

Vascular: No hyperdense vessel or unexpected calcification.

Skull: Normal. Negative for fracture or focal lesion.

Sinuses/Orbits: There is mild mucosal thickening in the paranasal
sinuses. There is unchanged bilateral proptosis.

Other: Endotracheal and enteric catheters are noted.
IMPRESSION: No acute intracranial pathology or epileptogenic focus identified.

## 2021-07-08 IMAGING — DX DG CHEST 1V PORT
1 series · 1 of 1 positions shown · non-contrast
Comparison: [DATE] at [4N] hours

CLINICAL DATA: Intubation

EXAM:
PORTABLE CHEST 1 VIEW

[chest ap]
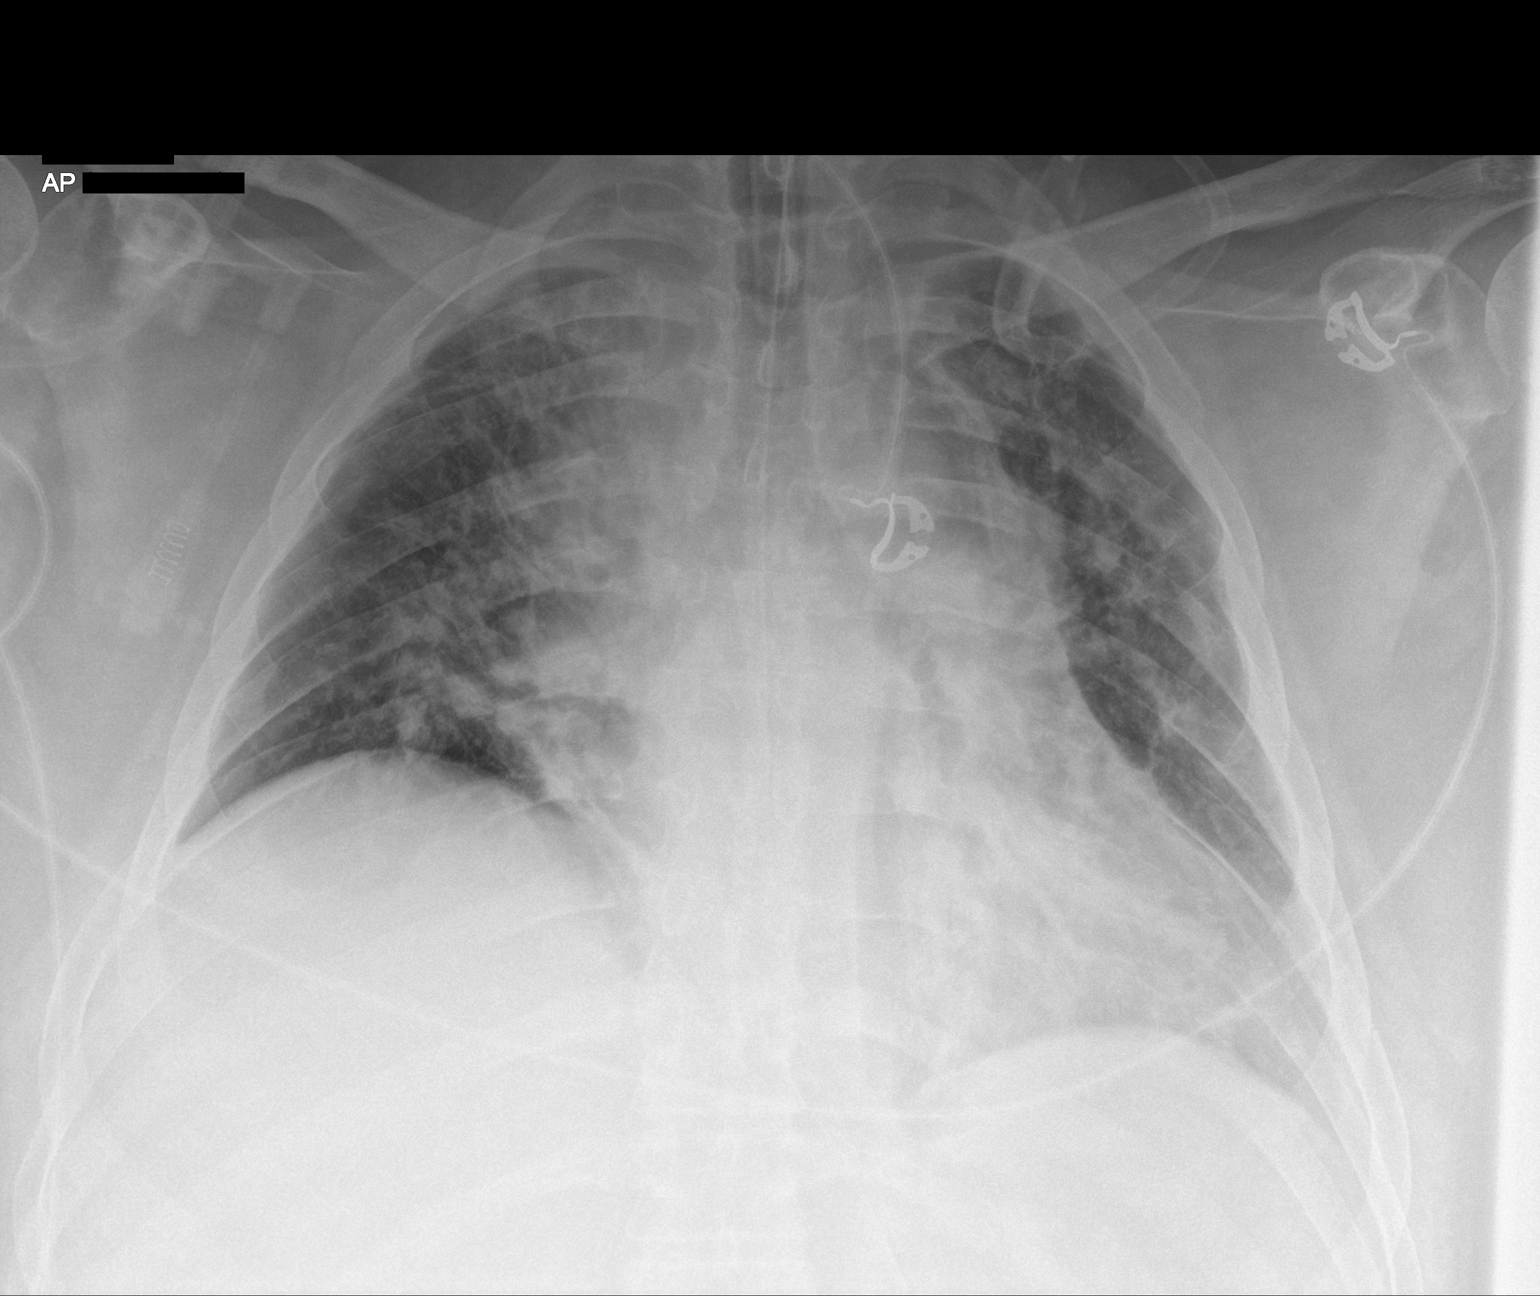

[1 of 1 positions shown; findings below may reference images not displayed]

FINDINGS: Interval placement of endotracheal tube with distal tip terminating
4.7 cm above the carina. Interval placement of enteric tube with
distal tip extending beyond the inferior margin of the film. Heart
size appears mildly enlarged, unchanged. Persistent airspace
opacities bilaterally, unchanged. No pleural effusion or
pneumothorax.
IMPRESSION: 1. Interval placement of endotracheal and enteric tubes in
appropriate position.
2. Persistent bilateral airspace opacities, unchanged.

## 2021-07-08 IMAGING — CT CT ABD-PELV W/ CM
2 of 4 series · 16 of 46 positions shown, 18 images · IV contrast (omnipaque)
Comparison: CT abdomen and pelvis [DATE]

CLINICAL DATA: Abdominal abscess/infection

EXAM:
CT ABDOMEN AND PELVIS WITH CONTRAST
TECHNIQUE: Multidetector CT imaging of the abdomen and pelvis was performed
using the standard protocol following bolus administration of
intravenous contrast.
CONTRAST:  100mL OMNIPAQUE IOHEXOL 350 MG/ML SOLN

[Series 2: axial st · axial · 0.98mm/px · z∈[-1006,-476]mm · 13 of 116 slices shown, 15 images]
[im 5/116  soft-tissue]
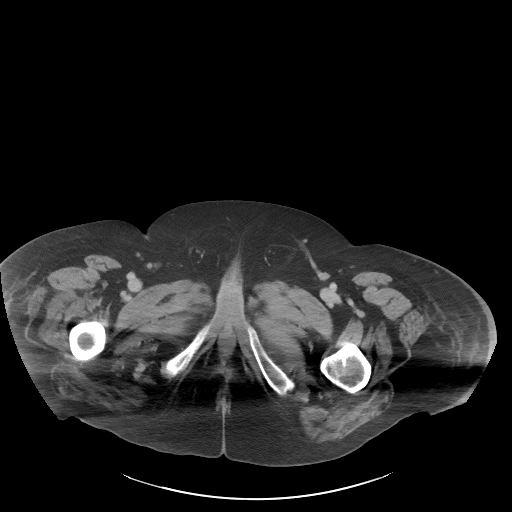
[im 5/116  bone]
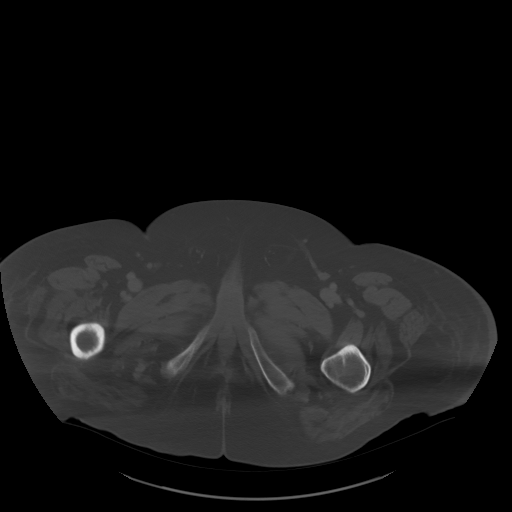
[im 15/116  soft-tissue]
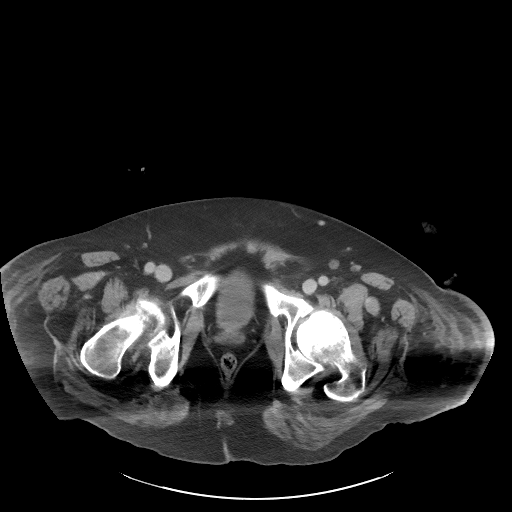
[im 24/116  soft-tissue]
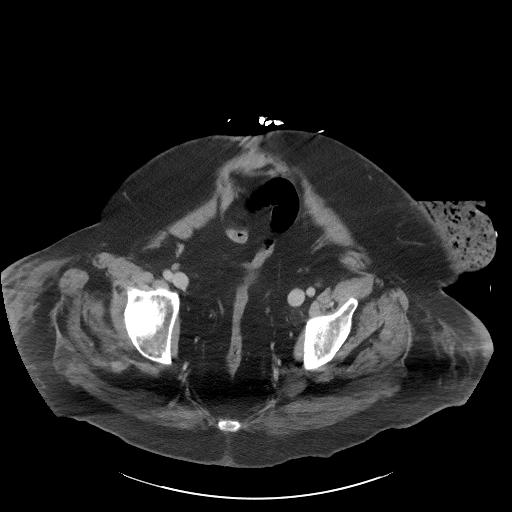
[im 34/116  soft-tissue]
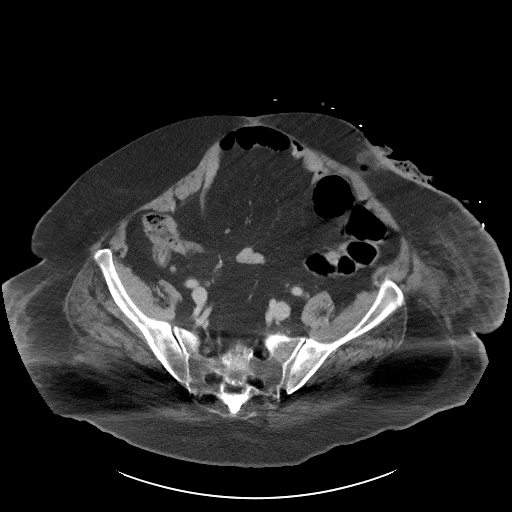
[im 39/116  soft-tissue]
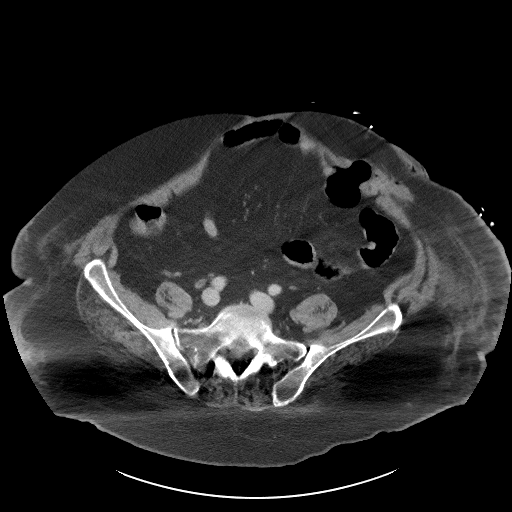
[im 48/116  soft-tissue]
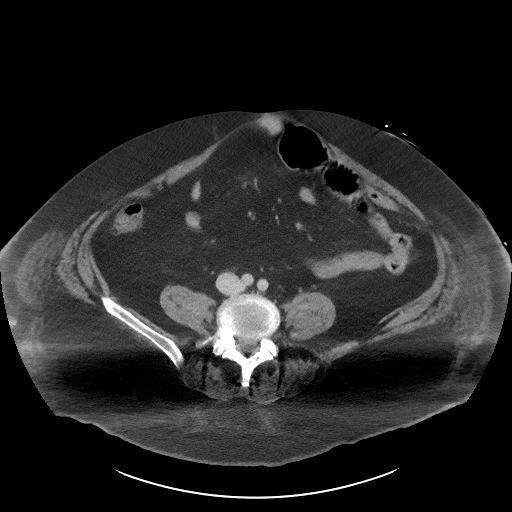
[im 58/116  soft-tissue]
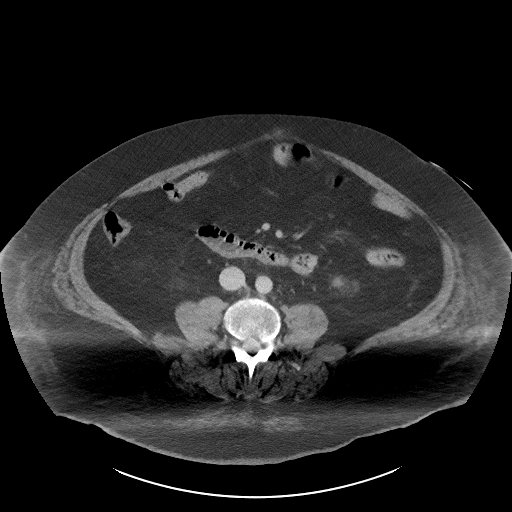
[im 68/116  soft-tissue]
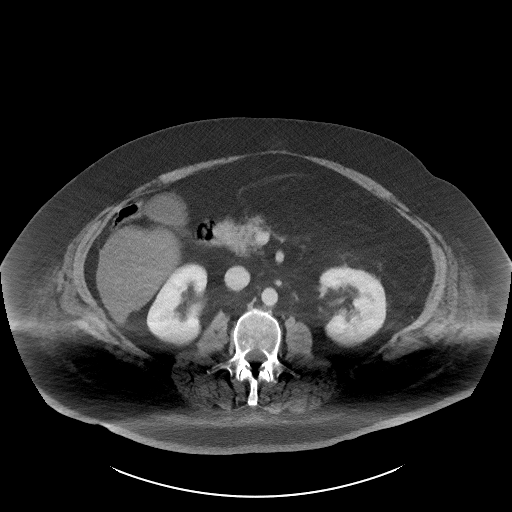
[im 77/116  soft-tissue]
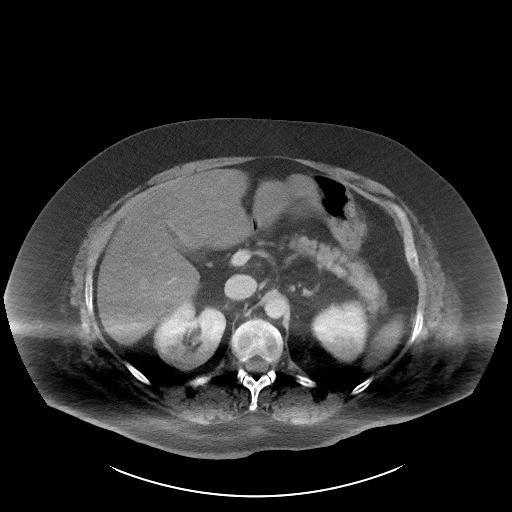
[im 77/116  bone]
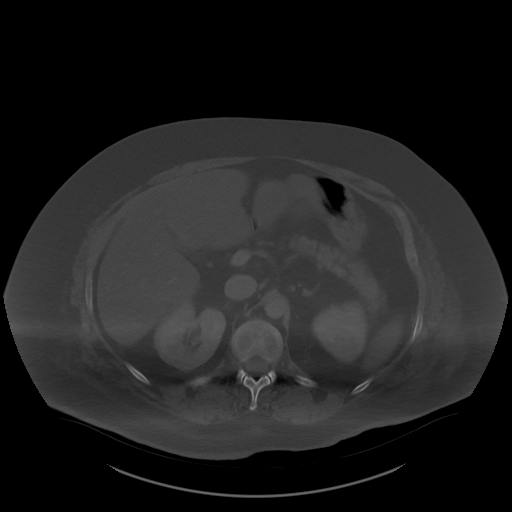
[im 82/116  soft-tissue]
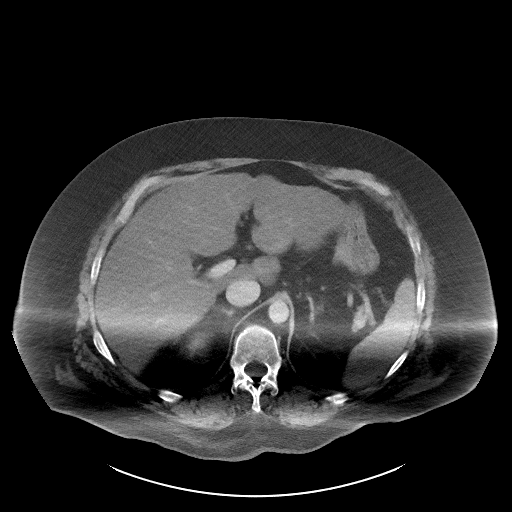
[im 92/116  soft-tissue]
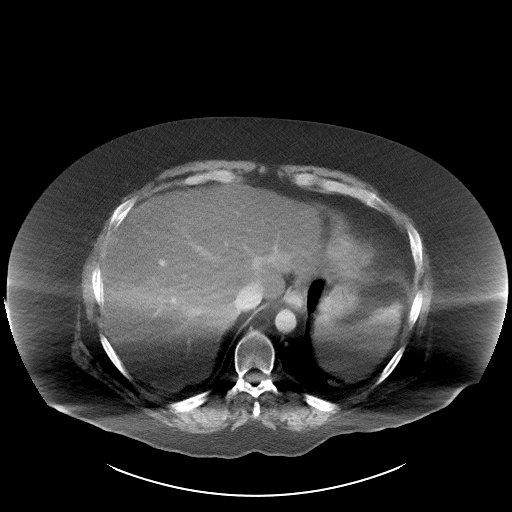
[im 101/116  soft-tissue]
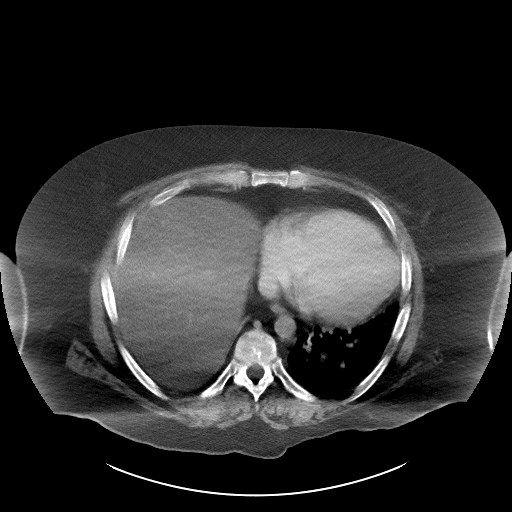
[im 111/116  soft-tissue]
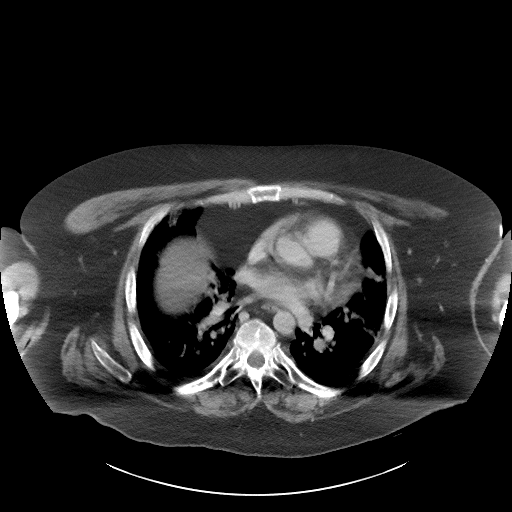

[Series 5: coronal st · coronal · 1.05mm/px · 3 of 120 slices shown]
[im 40/120  soft-tissue]
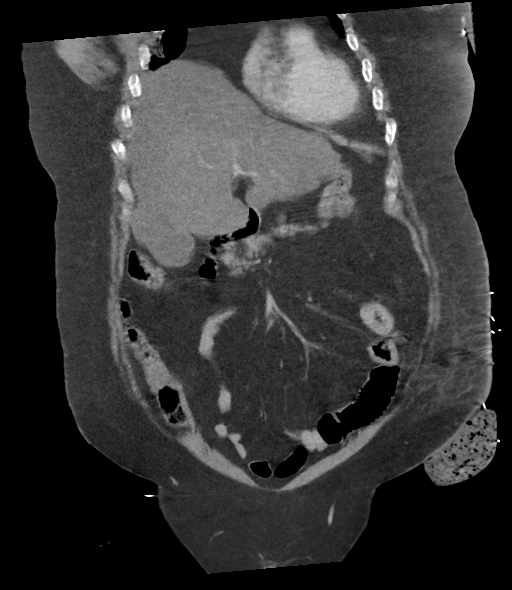
[im 53/120  soft-tissue]
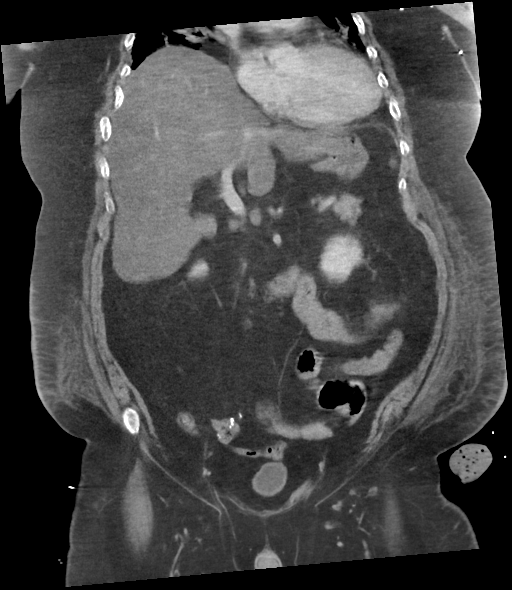
[im 67/120  soft-tissue]
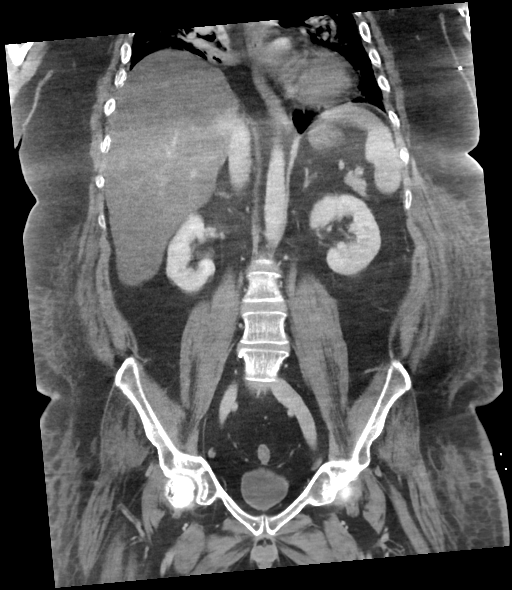

[16 of 46 positions shown; findings below may reference images not displayed]

FINDINGS: Somewhat limited due to motion.

Lower chest: Breathing motion and irregular densities identified in
the bilateral lower lungs.

Hepatobiliary: Liver is enlarged measuring 25 cm in length with mild
lobulated contour and hypodense parenchyma. Gallbladder appears
grossly normal. No biliary ductal dilatation identified.

Pancreas: Unremarkable. No pancreatic ductal dilatation or
surrounding inflammatory changes.

Spleen: Normal in size without focal abnormality.

Adrenals/Urinary Tract: Adrenal glands are unremarkable. Kidneys are
normal, without renal calculi, focal lesion, or hydronephrosis.
Bladder is unremarkable.

Stomach/Bowel: No bowel obstruction, free air or pneumatosis.
Previous partial left colectomy surgical changes with a left-sided
colostomy. No bowel wall edema identified. Appendix appears normal.

Vascular/Lymphatic: No significant vascular findings are present. No
enlarged abdominal or pelvic lymph nodes.

Reproductive: Prostate is unremarkable.

Other: Rectus diastasis.  No ascites.

Musculoskeletal: Chronic avascular necrosis changes in the femoral
heads. No suspicious bony lesions identified.
IMPRESSION: 1. No acute process identified in the abdomen or pelvis.
2. Hepatomegaly and steatosis.
3. Other chronic findings as described.

## 2021-07-08 MED ORDER — METHYLPREDNISOLONE SODIUM SUCC 40 MG IJ SOLR
20.0000 mg | Freq: Every day | INTRAMUSCULAR | Status: AC
  Administered 2021-07-08 – 2021-07-09 (×2): 20 mg via INTRAVENOUS
  Filled 2021-07-08 (×2): qty 1

## 2021-07-08 MED ORDER — SODIUM CHLORIDE 0.9 % IV SOLN
75.0000 mL/h | INTRAVENOUS | Status: DC
  Administered 2021-07-08: 75 mL/h via INTRAVENOUS

## 2021-07-08 MED ORDER — PROPOFOL 1000 MG/100ML IV EMUL
INTRAVENOUS | Status: AC
  Filled 2021-07-08: qty 100

## 2021-07-08 MED ORDER — DOCUSATE SODIUM 100 MG PO CAPS
100.0000 mg | ORAL_CAPSULE | Freq: Two times a day (BID) | ORAL | Status: DC | PRN
  Filled 2021-07-08: qty 1

## 2021-07-08 MED ORDER — POTASSIUM PHOSPHATES 15 MMOLE/5ML IV SOLN
45.0000 mmol | Freq: Once | INTRAVENOUS | Status: AC
  Administered 2021-07-08: 45 mmol via INTRAVENOUS
  Filled 2021-07-08: qty 15

## 2021-07-08 MED ORDER — CHLORHEXIDINE GLUCONATE 0.12% ORAL RINSE (MEDLINE KIT)
15.0000 mL | Freq: Two times a day (BID) | OROMUCOSAL | Status: DC
  Administered 2021-07-08 – 2021-07-23 (×24): 15 mL via OROMUCOSAL
  Filled 2021-07-08 (×2): qty 15

## 2021-07-08 MED ORDER — LEVALBUTEROL HCL 0.63 MG/3ML IN NEBU: 0.6300 mg | INHALATION_SOLUTION | Freq: Four times a day (QID) | RESPIRATORY_TRACT | Status: DC

## 2021-07-08 MED ORDER — LEVALBUTEROL HCL 0.63 MG/3ML IN NEBU
0.6300 mg | INHALATION_SOLUTION | Freq: Four times a day (QID) | RESPIRATORY_TRACT | Status: DC | PRN
  Filled 2021-07-08: qty 3

## 2021-07-08 MED ORDER — POLYETHYLENE GLYCOL 3350 17 G PO PACK
17.0000 g | PACK | Freq: Every day | ORAL | Status: DC | PRN
  Filled 2021-07-08: qty 1

## 2021-07-08 MED ORDER — POTASSIUM CHLORIDE 10 MEQ/100ML IV SOLN
10.0000 meq | INTRAVENOUS | Status: AC
  Administered 2021-07-08 (×2): 10 meq via INTRAVENOUS
  Filled 2021-07-08 (×2): qty 100

## 2021-07-08 MED ORDER — POLYETHYLENE GLYCOL 3350 17 G PO PACK
17.0000 g | PACK | Freq: Every day | ORAL | Status: DC
  Administered 2021-07-15: 17 g
  Filled 2021-07-08 (×4): qty 1

## 2021-07-08 MED ORDER — ONDANSETRON HCL 4 MG/2ML IJ SOLN
4.0000 mg | Freq: Once | INTRAMUSCULAR | Status: AC
  Administered 2021-07-08: 4 mg via INTRAVENOUS
  Filled 2021-07-08: qty 2

## 2021-07-08 MED ORDER — FUROSEMIDE 10 MG/ML IJ SOLN
80.0000 mg | Freq: Once | INTRAMUSCULAR | Status: AC
  Administered 2021-07-08: 80 mg via INTRAVENOUS
  Filled 2021-07-08: qty 8

## 2021-07-08 MED ORDER — DEXMEDETOMIDINE BOLUS VIA INFUSION
1.0000 ug/kg | Freq: Once | INTRAVENOUS | Status: DC
  Filled 2021-07-08: qty 127

## 2021-07-08 MED ORDER — VANCOMYCIN HCL 1500 MG/300ML IV SOLN
1500.0000 mg | Freq: Three times a day (TID) | INTRAVENOUS | Status: DC
  Administered 2021-07-08: 1500 mg via INTRAVENOUS
  Filled 2021-07-08 (×3): qty 300

## 2021-07-08 MED ORDER — MAGNESIUM SULFATE 4 GM/100ML IV SOLN
4.0000 g | Freq: Once | INTRAVENOUS | Status: AC
  Administered 2021-07-08: 4 g via INTRAVENOUS
  Filled 2021-07-08: qty 100

## 2021-07-08 MED ORDER — VANCOMYCIN HCL IN DEXTROSE 1-5 GM/200ML-% IV SOLN
1000.0000 mg | Freq: Once | INTRAVENOUS | Status: AC
  Administered 2021-07-08: 1000 mg via INTRAVENOUS
  Filled 2021-07-08: qty 200

## 2021-07-08 MED ORDER — SODIUM CHLORIDE 0.9 % IV SOLN: INTRAVENOUS | Status: DC | PRN

## 2021-07-08 MED ORDER — FENTANYL BOLUS VIA INFUSION
50.0000 ug | INTRAVENOUS | Status: DC | PRN
  Administered 2021-07-09: 50 ug via INTRAVENOUS
  Administered 2021-07-10: 100 ug via INTRAVENOUS
  Filled 2021-07-08: qty 100

## 2021-07-08 MED ORDER — ROCURONIUM BROMIDE 50 MG/5ML IV SOLN
INTRAVENOUS | Status: AC | PRN
  Administered 2021-07-08: 100 mg via INTRAVENOUS

## 2021-07-08 MED ORDER — LORAZEPAM 2 MG/ML IJ SOLN
INTRAMUSCULAR | Status: AC
  Filled 2021-07-08: qty 1

## 2021-07-08 MED ORDER — FAMOTIDINE IN NACL 20-0.9 MG/50ML-% IV SOLN
20.0000 mg | Freq: Two times a day (BID) | INTRAVENOUS | Status: DC
  Administered 2021-07-08 – 2021-07-16 (×16): 20 mg via INTRAVENOUS
  Filled 2021-07-08 (×19): qty 50

## 2021-07-08 MED ORDER — LORAZEPAM 2 MG/ML IJ SOLN: 2.0000 mg | INTRAMUSCULAR | Status: DC | PRN

## 2021-07-08 MED ORDER — BUDESONIDE 0.25 MG/2ML IN SUSP: 0.2500 mg | Freq: Two times a day (BID) | RESPIRATORY_TRACT | Status: DC

## 2021-07-08 MED ORDER — DOCUSATE SODIUM 50 MG/5ML PO LIQD
100.0000 mg | Freq: Two times a day (BID) | ORAL | Status: DC
  Administered 2021-07-08 – 2021-07-09 (×2): 100 mg
  Filled 2021-07-08 (×6): qty 10

## 2021-07-08 MED ORDER — ORAL CARE MOUTH RINSE
15.0000 mL | OROMUCOSAL | Status: DC
  Administered 2021-07-08 – 2021-07-12 (×32): 15 mL via OROMUCOSAL
  Filled 2021-07-08 (×7): qty 15

## 2021-07-08 MED ORDER — LEVETIRACETAM IN NACL 500 MG/100ML IV SOLN
500.0000 mg | Freq: Two times a day (BID) | INTRAVENOUS | Status: DC
  Administered 2021-07-08: 500 mg via INTRAVENOUS
  Filled 2021-07-08 (×2): qty 100

## 2021-07-08 MED ORDER — FOLIC ACID 1 MG PO TABS
1.0000 mg | ORAL_TABLET | Freq: Every day | ORAL | Status: DC
  Administered 2021-07-09: 1 mg via ORAL
  Filled 2021-07-08: qty 1

## 2021-07-08 MED ORDER — MORPHINE SULFATE (PF) 4 MG/ML IV SOLN
4.0000 mg | INTRAVENOUS | Status: DC | PRN
  Administered 2021-07-08: 4 mg via INTRAVENOUS
  Filled 2021-07-08: qty 1

## 2021-07-08 MED ORDER — SODIUM CHLORIDE 0.9 % IV SOLN
500.0000 mg | Freq: Once | INTRAVENOUS | Status: AC
  Administered 2021-07-08: 500 mg via INTRAVENOUS
  Filled 2021-07-08: qty 500

## 2021-07-08 MED ORDER — SODIUM CHLORIDE 0.9 % IV BOLUS
1000.0000 mL | Freq: Once | INTRAVENOUS | Status: AC
  Administered 2021-07-08: 1000 mL via INTRAVENOUS

## 2021-07-08 MED ORDER — ETOMIDATE 2 MG/ML IV SOLN
INTRAVENOUS | Status: AC | PRN
  Administered 2021-07-08: 20 mg via INTRAVENOUS

## 2021-07-08 MED ORDER — FENTANYL 2500MCG IN NS 250ML (10MCG/ML) PREMIX INFUSION
50.0000 ug/h | INTRAVENOUS | Status: DC
Start: 2021-07-08 — End: 2021-07-10
  Administered 2021-07-08: 100 ug/h via INTRAVENOUS
  Administered 2021-07-09: 60 ug/h via INTRAVENOUS
  Filled 2021-07-08 (×2): qty 250

## 2021-07-08 MED ORDER — HEPARIN (PORCINE) 25000 UT/250ML-% IV SOLN
2100.0000 [IU]/h | INTRAVENOUS | Status: DC
  Administered 2021-07-08: 20:00:00 1800 [IU]/h via INTRAVENOUS
  Administered 2021-07-09: 2100 [IU]/h via INTRAVENOUS
  Filled 2021-07-08 (×2): qty 250

## 2021-07-08 MED ORDER — DEXMEDETOMIDINE HCL IN NACL 400 MCG/100ML IV SOLN
0.4000 ug/kg/h | INTRAVENOUS | Status: DC
  Filled 2021-07-08: qty 100

## 2021-07-08 MED ORDER — PROPOFOL 1000 MG/100ML IV EMUL
0.0000 ug/kg/min | INTRAVENOUS | Status: DC
  Administered 2021-07-08: 5 ug/kg/min via INTRAVENOUS

## 2021-07-08 MED ORDER — CHLORHEXIDINE GLUCONATE CLOTH 2 % EX PADS
6.0000 | MEDICATED_PAD | Freq: Every day | CUTANEOUS | Status: DC
  Administered 2021-07-09 – 2021-07-25 (×17): 6 via TOPICAL

## 2021-07-08 MED ORDER — HEPARIN SODIUM (PORCINE) 5000 UNIT/ML IJ SOLN: 5000.0000 [IU] | Freq: Three times a day (TID) | INTRAMUSCULAR | Status: DC

## 2021-07-08 MED ORDER — IOHEXOL 350 MG/ML SOLN
100.0000 mL | Freq: Once | INTRAVENOUS | Status: AC | PRN
  Administered 2021-07-08: 100 mL via INTRAVENOUS

## 2021-07-08 MED ORDER — PROPOFOL 1000 MG/100ML IV EMUL
5.0000 ug/kg/min | INTRAVENOUS | Status: DC
  Administered 2021-07-08 – 2021-07-09 (×3): 40 ug/kg/min via INTRAVENOUS
  Administered 2021-07-09: 30 ug/kg/min via INTRAVENOUS
  Filled 2021-07-08 (×5): qty 100

## 2021-07-08 MED ORDER — LEVETIRACETAM IN NACL 1500 MG/100ML IV SOLN
1500.0000 mg | Freq: Once | INTRAVENOUS | Status: AC
  Administered 2021-07-08: 1500 mg via INTRAVENOUS
  Filled 2021-07-08: qty 100

## 2021-07-08 MED ORDER — HEPARIN BOLUS VIA INFUSION
5000.0000 [IU] | Freq: Once | INTRAVENOUS | Status: AC
  Administered 2021-07-08: 5000 [IU] via INTRAVENOUS
  Filled 2021-07-08: qty 5000

## 2021-07-08 MED ORDER — SODIUM CHLORIDE 0.9 % IV SOLN
2.0000 g | Freq: Three times a day (TID) | INTRAVENOUS | Status: DC
  Administered 2021-07-08 – 2021-07-09 (×2): 2 g via INTRAVENOUS
  Filled 2021-07-08 (×3): qty 2

## 2021-07-08 MED ORDER — INSULIN ASPART 100 UNIT/ML IJ SOLN
0.0000 [IU] | INTRAMUSCULAR | Status: DC
  Administered 2021-07-08: 7 [IU] via SUBCUTANEOUS
  Administered 2021-07-09 (×2): 11 [IU] via SUBCUTANEOUS
  Administered 2021-07-09: 7 [IU] via SUBCUTANEOUS
  Administered 2021-07-09 (×2): 11 [IU] via SUBCUTANEOUS
  Administered 2021-07-09 – 2021-07-10 (×3): 7 [IU] via SUBCUTANEOUS
  Administered 2021-07-10: 20:00:00 11 [IU] via SUBCUTANEOUS
  Administered 2021-07-10: 7 [IU] via SUBCUTANEOUS
  Administered 2021-07-10: 04:00:00 11 [IU] via SUBCUTANEOUS
  Administered 2021-07-10 (×2): 7 [IU] via SUBCUTANEOUS
  Administered 2021-07-11 – 2021-07-12 (×3): 4 [IU] via SUBCUTANEOUS
  Administered 2021-07-12: 7 [IU] via SUBCUTANEOUS
  Administered 2021-07-12 – 2021-07-13 (×2): 3 [IU] via SUBCUTANEOUS
  Administered 2021-07-13: 4 [IU] via SUBCUTANEOUS
  Administered 2021-07-13: 3 [IU] via SUBCUTANEOUS
  Administered 2021-07-13: 11 [IU] via SUBCUTANEOUS
  Administered 2021-07-14: 4 [IU] via SUBCUTANEOUS
  Administered 2021-07-14: 3 [IU] via SUBCUTANEOUS
  Administered 2021-07-14: 7 [IU] via SUBCUTANEOUS
  Administered 2021-07-14 (×2): 3 [IU] via SUBCUTANEOUS
  Filled 2021-07-08 (×27): qty 1

## 2021-07-08 MED ORDER — SODIUM CHLORIDE 0.9 % IV SOLN
2.0000 g | Freq: Once | INTRAVENOUS | Status: AC
  Administered 2021-07-08: 2 g via INTRAVENOUS
  Filled 2021-07-08: qty 2

## 2021-07-08 NOTE — ED Triage Notes (Signed)
Pt to ED [facility] from home for shob, EMS reports meets sepsis criteria Ems reports HR 140-150. On 2 L [place] at home, increased to 6L [place] with EMS to maintain SpO2. Declined health since dx with covid a year ago. Swelling to BLE. Takes lasix.  Ostomy on arrival.  Alert and oriented

## 2021-07-08 NOTE — Progress Notes (Signed)
Pt transported to CT and then to CCU with no issues.

## 2021-07-08 NOTE — Progress Notes (Signed)
CODE SEPSIS - PHARMACY COMMUNICATION  **Broad Spectrum Antibiotics should be administered within 1 hour of Sepsis diagnosis**  Time Code Sepsis Called/Page Received: 6147  Antibiotics Ordered: vancomycin 1,000 mg x 1 and cefepime 2 grams x 1  Time of 1st antibiotic administration: 1024  Additional action taken by pharmacy: Messaged RN at 30 min mark  If necessary, Name of Provider/Nurse Contacted: Nira Conn, RN    Wynelle Cleveland, PharmD Pharmacy Resident  07/08/2021 9:39 AM

## 2021-07-08 NOTE — Progress Notes (Signed)
PHARMACY CONSULT NOTE - FOLLOW UP  Pharmacy Consult for Electrolyte Monitoring and Replacement   Recent Labs: Potassium (mmol/L)  Date Value  07/08/2021 3.4 (L)   Magnesium (mg/dL)  Date Value  09/24/2020 1.7   Calcium (mg/dL)  Date Value  07/08/2021 9.0   Albumin (g/dL)  Date Value  07/08/2021 3.0 (L)   Phosphorus (mg/dL)  Date Value  09/24/2020 4.3   Sodium (mmol/L)  Date Value  07/08/2021 134 (L)     Assessment: 30YOM presenting with shortness of breath and sepsis. Currently intubated. PMH includes PE and MI. IVF with NS @ 75 mL/hr. K low at 3.4  Goal of Therapy:  WNL K 4-5  Plan:  Give Kcl IV 10 mEq x 2 F/u phos, magnesium. F/u BMP 11/20 AM.   Wynelle Cleveland, PharmD Pharmacy Resident  07/08/2021 2:48 PM

## 2021-07-08 NOTE — Consult Note (Addendum)
Pharmacy Antibiotic Note  Craig [name] is a 30 y.o. male admitted on 07/08/2021 with pneumonia.  Pharmacy has been consulted for vancomycin and cefepime dosing.  Plan: Vancomycin 1000 mg dose given in the ED, followed by 1500 mg IV q8h Goal AUC 400-550  Est AUC: 482.1 Est Cmax: 32.2 Est Cmin: 13.9 Calculated with SCr 0.8, Vd 0.5  Cefepime 2 g IV q8h   Monitor clinical picture, renal function, and vancomycin levels at steady state F/U C&S, abx deescalation / LOT   Height: 6\' 1"  (185.4 cm) Weight: (!) 145 kg (319 lb 10.7 oz) IBW/kg (Calculated) : 79.9  Temp (24hrs), Avg:99.4 F (37.4 C), Min:98.3 F (36.8 C), Max:100.5 F (38.1 C)  Recent Labs  Lab 07/08/21 0821 07/08/21 1203  WBC 9.8  --   CREATININE 0.50*  --   LATICACIDVEN 3.5* 1.9    Estimated Creatinine Clearance: 198.6 mL/min (A) (by C-G formula based on SCr of 0.5 mg/dL (L)).    Allergies  Allergen Reactions   Amoxicillin Rash    Tolerated cefepime and cefazolin 08/2020.    Antimicrobials this admission: 11/19 vancomycin >>  11/19 cefepime >>   Dose adjustments this admission: N/A  Microbiology results: 11/19 BCx: pending 11/19 respiratory panel: pending 11/19 trach aspirate: pending  11/19 MRSA PCR: negative  Thank you for allowing pharmacy to be a part of this patient's care.  Darnelle Bos, PharmD 07/08/2021 5:27 PM

## 2021-07-08 NOTE — ED Provider Notes (Signed)
Responded to a overhead page for immediate need for physician in patient's ED room  On arrival, Dr. Quentin Cornwall present, Dr. Quentin Cornwall and I worked together to establish bag-valve-mask ventilations.  Patient appeared peripherally cyanotic and agonal respiratory pattern. Patient's initial oximetry on entry to room showed severe hypoxemia, this corrected quickly with bag-valve-mask to person ventilation with good compliance which the patient maintain saturations in the high 80s to low 90% range thereafter until the time of intubation.  Intubation was performed by Dr. Quentin Cornwall, I confirmed visually via the glide scope that endotracheal tube entered through the vocal cords as to Dr. Quentin Cornwall.  Also in addition the patient had a positive end-tidal CO2 on multiple breaths with colorimetric.  Patient's oxygen saturations briefly dropped during intubation to approximately mid 80s, and rebounded quickly after endotracheal tube placed with oxygen saturations resultant in the low 90 percentile range.  Dr. Quentin Cornwall performing ongoing care.   Delman Kitten, MD 07/08/21 1332

## 2021-07-08 NOTE — Consult Note (Signed)
ANTICOAGULATION CONSULT NOTE - Follow Up Consult  Pharmacy Consult for heparin Indication: Hx of upper extremity DVT  Allergies  Allergen Reactions   Amoxicillin Rash    Tolerated cefepime and cefazolin 08/2020.    Patient Measurements: Height: 6\' 1"  (185.4 cm) Weight: (!) 145 kg (319 lb 10.7 oz) IBW/kg (Calculated) : 79.9 Heparin Dosing Weight: 108 kg  Vital Signs: Temp: 100.5 F (38.1 C) (11/19 1619) Temp Source: Axillary (11/19 1619) BP: 144/83 (11/19 1619) Pulse Rate: 115 (11/19 1530)  Labs: Recent Labs    07/08/21 0821 07/08/21 1448  HGB 9.9*  --   HCT 33.6*  --   PLT 201  --   CREATININE 0.50*  --   TROPONINIHS  --  55*    Estimated Creatinine Clearance: 198.6 mL/min (A) (by C-G formula based on SCr of 0.5 mg/dL (L)).   Medications:  Xarelto 20 mg daily in the morning, last dose 11/18 @ 0800  Assessment: 30 y.o. 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 Pharmacy has been consulted for heparin dosing.   Baseline labs: Hgb 9.9, Plts 201, aPTT 32, INR 1.0, heparin level <0.10  Goal of Therapy:  Heparin level 0.3-0.7 units/ml Monitor platelets by anticoagulation protocol: Yes   Plan:  Give 5000 units bolus x 1 Start heparin infusion at 1800 units/hr Will check heparin level in 6 hours since baseline heparin level was undetectable despite recent DOAC use Continue to monitor H&H and platelets  Darnelle Bos, PharmD 07/08/2021,5:24 PM

## 2021-07-08 NOTE — Progress Notes (Signed)
Spoke with Nena Alexander RN re PICC order.  Notified PICC will not be placed today.  Will reassess in am if PICC still needed.  Has 2 PIV's per flowsheet at this time.

## 2021-07-08 NOTE — ED Provider Notes (Signed)
[facility] Emergency Department Provider Note    Event Date/Time   First MD Initiated Contact with Patient 07/08/21 [phone]     (approximate)  I have reviewed the triage vital signs and the nursing notes.   HISTORY  Chief Complaint Shortness of Breath    HPI Craig [name] is a 30 y.o. male below listed past medical history with long COVID presents to the ER for malaise worsening shortness of breath chills leg swelling.  Has not been on any recent antibiotics.  No known sick contacts.  Denies any abdominal pain.  No dysuria.  Does feel like he is sick with the flu.  History complicated by perforated bowel colostomy as well as staph bacteremia multiple bacterial pneumonias since he was sick with COVID.  Past Medical History:  Diagnosis Date   Gout    Hypertension    Rupture of bowel ([place])    Family History  Problem Relation Age of Onset   Healthy Mother    Healthy Father    Past Surgical History:  Procedure Laterality Date   COLONOSCOPY WITH PROPOFOL N/A 05/18/2020   Procedure: COLONOSCOPY WITH PROPOFOL;  Surgeon: Benjamine Sprague, DO;  Location: [facility] ENDOSCOPY;  Service: General;  Laterality: N/A;   COLOSTOMY N/A 11/22/2019   Procedure: COLOSTOMY;  Surgeon: Herbert Pun, MD;  Location: [facility] ORS;  Service: General;  Laterality: N/A;   LAPAROTOMY N/A 11/22/2019   Procedure: EXPLORATORY LAPAROTOMY;  Surgeon: Herbert Pun, MD;  Location: [facility] ORS;  Service: General;  Laterality: N/A;   LAPAROTOMY N/A 07/08/2020   Procedure: EXPLORATORY LAPAROTOMY;  Surgeon: Herbert Pun, MD;  Location: [facility] ORS;  Service: General;  Laterality: N/A;   PARTIAL COLECTOMY N/A 11/22/2019   Procedure: PARTIAL COLECTOMY;  Surgeon: Herbert Pun, MD;  Location: [facility] ORS;  Service: General;  Laterality: N/A;   PEG PLACEMENT N/A 09/14/2020   Procedure: PERCUTANEOUS ENDOSCOPIC GASTROSTOMY (PEG) PLACEMENT;  Surgeon: Benjamine Sprague, DO;  Location: [facility]  ENDOSCOPY;  Service: General;  Laterality: N/A;  TRAVEL CASE TRACH [place] N/A 09/13/2020   Procedure: TRACHEOSTOMY;  Surgeon: Beverly Gust, MD;  Location: [facility] ORS;  Service: ENT;  Laterality: N/A;   XI ROBOTIC ASSISTED COLOSTOMY TAKEDOWN N/A 06/28/2020   Procedure: XI ROBOTIC ASSISTED COLOSTOMY TAKEDOWN CONVERTED TO OPEN PROCEDURE;  Surgeon: Herbert Pun, MD;  Location: [facility] ORS;  Service: General;  Laterality: N/A;   Patient Active Problem List   Diagnosis Date Noted   Acute on chronic respiratory failure with hypoxemia ([place]) 07/08/2021   Neuropathic pain, arm 10/20/2020   Pressure injury of skin 09/18/2020   Staphylococcus aureus bacteremia    Fever    Metabolic encephalopathy    Acute respiratory failure with hypoxia ([place])    Acute hypoxemic respiratory failure due to COVID-19 ([place]) 08/09/2020   Severe sepsis ([place]) 08/09/2020   AKI (acute kidney injury) ([place]) 08/09/2020   Hypertension    Leak of anastomosis between gastrointestinal structures 07/08/2020   Pneumoperitoneum 07/08/2020   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 Authorizing Provider  acetaminophen (TYLENOL) 650 MG CR tablet Take by mouth. 10/14/20  Yes [provider]  albuterol (ACCUNEB) 1.25 MG/3ML nebulizer solution Take by nebulization. 03/07/21  Yes [provider]  allopurinol (  ZYLOPRIM) 300 MG tablet Take 300 mg by mouth daily. 02/09/21  Yes [provider]  budesonide (PULMICORT) 0.25 MG/2ML nebulizer solution Inhale into the lungs. 03/07/21 03/07/22 Yes [provider]  Cholecalciferol (VITAMIN D3) 25 MCG (1000 UT) CAPS Take by mouth.   Yes [provider]  Cyanocobalamin  (VITAMIN B-12 IJ) Inject as directed.   Yes [provider]  cyanocobalamin 1000 MCG tablet Take 1,000 mcg by mouth daily.   Yes [provider]  DULoxetine (CYMBALTA) 60 MG capsule Take by mouth. 11/14/20 11/14/21 Yes [provider]  famotidine (PEPCID) 20 MG tablet Take by mouth. 10/14/20  Yes [provider]  folic acid (FOLVITE) 1 MG tablet Take by mouth. 03/01/21 03/01/22 Yes [provider]  furosemide (LASIX) 20 MG tablet Take 40 mg by mouth daily. Taking 2 tablets once daily 03/07/21  Yes [provider]  ibuprofen (ADVIL) 600 MG tablet Take 600 mg by mouth every 6 (six) hours as needed.   Yes [provider]  lidocaine (LIDODERM) 5 % 1 patch daily as needed. 07/07/21  Yes [provider]  lisinopril (ZESTRIL) 5 MG tablet Take by mouth. 11/07/20  Yes [provider]  metoprolol succinate (TOPROL-XL) 100 MG 24 hr tablet Take by mouth. 01/05/21 01/05/22 Yes [provider]  Multiple Vitamins-Minerals (MULTIVITAMIN WITH MINERALS) tablet Take 1 tablet by mouth daily.   Yes [provider]  Omega-3 Fatty Acids (FISH OIL) 1000 MG CAPS Take by mouth.   Yes [provider]  ondansetron (ZOFRAN) 4 MG tablet Take by mouth. 10/14/20  Yes [provider]  potassium chloride (KLOR-CON) 10 MEQ tablet Take 10 mEq by mouth daily. 04/27/21  Yes [provider]  predniSONE (DELTASONE) 20 MG tablet Take 20 mg by mouth daily. 03/01/21  Yes [provider]  pregabalin (LYRICA) 200 MG capsule Take by mouth. 03/01/21 03/01/22 Yes [provider]  rivaroxaban (XARELTO) 20 MG TABS tablet Take by mouth. 11/07/20  Yes [provider]  traMADol (ULTRAM) 50 MG tablet Take by mouth. 03/16/21  Yes [provider]  cetirizine (ZYRTEC) 10 MG tablet Take 10 mg by mouth daily. Patient not taking: Reported on 07/08/2021    [provider]  clonazePAM (KLONOPIN) 1 MG  tablet Take by mouth. Patient not taking: Reported on 07/08/2021 10/14/20   [provider]  [employer] SALINE NASAL SPRAY 0.65 % nasal spray SMARTSIG:1 Spray(s) Both Nares Every 4 Hours PRN 10/14/20   [provider]  doxazosin (CARDURA) 1 MG tablet Take by mouth. Patient not taking: Reported on 07/08/2021 11/07/20   [provider]  doxycycline (VIBRAMYCIN) 100 MG capsule Take 100 mg by mouth 2 (two) times daily. 01/06/21   [provider]  fluticasone-salmeterol (ADVAIR) 100-50 MCG/ACT AEPB Inhale into the lungs. Patient not taking: Reported on 03/08/2021 10/27/20 10/27/21  [provider]  HYDROcodone-acetaminophen (NORCO/VICODIN) 5-325 MG tablet Take 1 tablet by mouth every 6 (six) hours as needed. Patient not taking: Reported on 03/27/2021 10/17/20   [provider]  melatonin 3 MG TABS tablet Take 6 mg by mouth at bedtime as needed. Patient not taking: Reported on 07/08/2021 10/14/20   [provider]  methotrexate (RHEUMATREX) 2.5 MG tablet Take by mouth. 03/01/21   [provider]  [place]     [provider]  OXYGEN Inhale into the lungs. 1.5 L with sleep    [provider]  predniSONE (DELTASONE) 5 MG tablet 6 pills x1day,  5x1,4x1,3x1,2x1,1x1 Patient not taking: Reported on 03/08/2021 02/06/21   [provider]    Allergies Amoxicillin    Social History Social History   Tobacco Use   Smoking status: Never   Smokeless tobacco: Current    Types: Chew   Tobacco comments:    Not ready to quit.   Vaping Use   Vaping Use: Never used  Substance Use Topics   Alcohol use: Yes    Comment: pint to a fifth per day per patient . Patient reports he hasn't had anything to drink in three weeks.   Drug use: Never    Review of Systems Patient denies headaches, rhinorrhea, blurry vision, numbness, shortness of breath, chest pain, edema, cough, abdominal pain, nausea, vomiting, diarrhea,  dysuria, fevers, rashes or hallucinations unless otherwise stated above in HPI. ____________________________________________   PHYSICAL EXAM:  VITAL SIGNS: Vitals:   07/08/21 1500 07/08/21 1530  BP: 110/70 104/61  Pulse: (!) 120 (!) 115  Resp:  12  Temp:    SpO2: 100% 100%    Constitutional: Alert and oriented. Ill appearing Eyes: Conjunctivae are normal.  Head: Atraumatic. Nose: No congestion/rhinnorhea. Mouth/Throat: Mucous membranes are dry   Neck: No stridor. Painless ROM.  Cardiovascular: tachycardic rate, regular rhythm. Grossly normal heart sounds.  Good peripheral circulation. Respiratory: mild tachypnea with new o2 requirement,  coarse bibasilar bs Gastrointestinal: Soft and nontender.  Left ocstomy site appears c/d/I No distention. No abdominal bruits. No CVA tenderness. Genitourinary: superficial skin irritation in folds, no crepitus or blistering Musculoskeletal: No lower extremity tenderness nor edema.  No joint effusions. Neurologic:  Normal speech and language. No gross focal neurologic deficits are appreciated. No facial droop Skin:  Skin is warm, dry and intact Psychiatric: calm  ____________________________________________   LABS (all labs ordered are listed, but only abnormal results are displayed)  Results for orders placed or performed during the hospital encounter of 07/08/21 (from the past 24 hour(s))  Lactic acid, plasma     Status: Abnormal   Collection Time: 07/08/21  8:21 AM  Result Value Ref Range   Lactic Acid, Venous 3.5 (HH) 0.5 - 1.9 mmol/L  Comprehensive metabolic panel     Status: Abnormal   Collection Time: 07/08/21  8:21 AM  Result Value Ref Range   Sodium 134 (L) 135 - 145 mmol/L   Potassium 3.4 (L) 3.5 - 5.1 mmol/L   Chloride 84 (L) 98 - 111 mmol/L   CO2 41 (H) 22 - 32 mmol/L   Glucose, Bld 287 (H) 70 - 99 mg/dL   BUN <5 (L) 6 - 20 mg/dL   Creatinine, Ser 0.50 (L) 0.61 - 1.24 mg/dL   Calcium 9.0 8.9 - 10.3 mg/dL   Total Protein  6.4 (L) 6.5 - 8.1 g/dL   Albumin 3.0 (L) 3.5 - 5.0 g/dL   AST 30 15 - 41 U/L   ALT 44 0 - 44 U/L   Alkaline Phosphatase 56 38 - 126 U/L   Total Bilirubin 1.2 0.3 - 1.2 mg/dL   GFR, Estimated >60 >60 mL/min   Anion gap 9 5 - 15  CBC WITH DIFFERENTIAL     Status: Abnormal   Collection Time: 07/08/21  8:21 AM  Result Value Ref Range   WBC 9.8 4.0 - 10.5 K/uL   RBC 3.50 (L) 4.22 - 5.81 MIL/uL   Hemoglobin 9.9 (L) 13.0 - 17.0 g/dL   HCT 33.6 (L) 39.0 - 52.0 %   MCV 96.0 80.0 - 100.0 fL   MCH  28.3 26.0 - 34.0 pg   MCHC 29.5 (L) 30.0 - 36.0 g/dL   RDW 18.4 (H) 11.5 - 15.5 %   Platelets 201 150 - 400 K/uL   nRBC 0.5 (H) 0.0 - 0.2 %   Neutrophils Relative % 76 %   Neutro Abs 7.4 1.7 - 7.7 K/uL   Lymphocytes Relative 9 %   Lymphs Abs 0.9 0.7 - 4.0 K/uL   Monocytes Relative 13 %   Monocytes Absolute 1.2 (H) 0.1 - 1.0 K/uL   Eosinophils Relative 0 %   Eosinophils Absolute 0.0 0.0 - 0.5 K/uL   Basophils Relative 0 %   Basophils Absolute 0.0 0.0 - 0.1 K/uL   Immature Granulocytes 2 %   Abs Immature Granulocytes 0.17 (H) 0.00 - 0.07 K/uL  Blood gas, venous     Status: Abnormal   Collection Time: 07/08/21  9:36 AM  Result Value Ref Range   pH, Ven 7.44 (H) 7.250 - 7.430   pCO2, Ven 67 (H) 44.0 - 60.0 mmHg   pO2, Ven 49.0 (H) 32.0 - 45.0 mmHg   Bicarbonate 45.5 (H) 20.0 - 28.0 mmol/L   Acid-Base Excess 18.5 (H) 0.0 - 2.0 mmol/L   O2 Saturation 85.7 %   Patient temperature 37.0    Collection site VENOUS    Sample type VENOUS   Urinalysis, Complete w Microscopic     Status: Abnormal   Collection Time: 07/08/21 10:50 AM  Result Value Ref Range   Color, Urine YELLOW (A) YELLOW   APPearance CLEAR (A) CLEAR   Specific Gravity, Urine 1.010 1.005 - 1.030   pH 8.0 5.0 - 8.0   Glucose, UA >=500 (A) NEGATIVE mg/dL   Hgb urine dipstick NEGATIVE NEGATIVE   Bilirubin Urine NEGATIVE NEGATIVE   Ketones, ur 80 (A) NEGATIVE mg/dL   Protein, ur 30 (A) NEGATIVE mg/dL   Nitrite NEGATIVE NEGATIVE    Leukocytes,Ua NEGATIVE NEGATIVE   RBC / HPF 0-5 0 - 5 RBC/hpf   WBC, UA 21-50 0 - 5 WBC/hpf   Bacteria, UA NONE SEEN NONE SEEN   Squamous Epithelial / LPF NONE SEEN 0 - 5   Mucus PRESENT   Lactic acid, plasma     Status: None   Collection Time: 07/08/21 12:03 PM  Result Value Ref Range   Lactic Acid, Venous 1.9 0.5 - 1.9 mmol/L  Resp Panel by RT-PCR (Flu A&B, Covid) Nasopharyngeal Swab     Status: None   Collection Time: 07/08/21  1:59 PM   Specimen: Nasopharyngeal Swab; Nasopharyngeal(NP) swabs in vial transport medium  Result Value Ref Range   SARS Coronavirus 2 by RT PCR NEGATIVE NEGATIVE   Influenza A by PCR NEGATIVE NEGATIVE   Influenza B by PCR NEGATIVE NEGATIVE  Urine Drug Screen, Qualitative ([facility] only)     Status: Abnormal   Collection Time: 07/08/21  2:09 PM  Result Value Ref Range   Tricyclic, Ur Screen NONE DETECTED NONE DETECTED   Amphetamines, Ur Screen NONE DETECTED NONE DETECTED   MDMA (Ecstasy)Ur Screen NONE DETECTED NONE DETECTED   Cocaine Metabolite,Ur [place] NONE DETECTED NONE DETECTED   Opiate, Ur Screen POSITIVE (A) NONE DETECTED   Phencyclidine (PCP) Ur S NONE DETECTED NONE DETECTED   Cannabinoid 50 Ng, Ur [place] NONE DETECTED NONE DETECTED   Barbiturates, Ur Screen NONE DETECTED NONE DETECTED   Benzodiazepine, Ur Scrn NONE DETECTED NONE DETECTED   Methadone Scn, Ur NONE DETECTED NONE DETECTED  Troponin I (High Sensitivity)     Status: Abnormal  Collection Time: 07/08/21  2:48 PM  Result Value Ref Range   Troponin I (High Sensitivity) 55 (H) <18 ng/L  Procalcitonin - Baseline     Status: None   Collection Time: 07/08/21  2:48 PM  Result Value Ref Range   Procalcitonin 0.33 ng/mL  Brain natriuretic peptide     Status: None   Collection Time: 07/08/21  2:48 PM  Result Value Ref Range   B Natriuretic Peptide 28.7 0.0 - 100.0 pg/mL   ____________________________________________  EKG My review and personal interpretation at Time: 8:28   Indication: sob   Rate: 140  Rhythm: sinus Axis: right Other: nonspecific st abn likely rate dependendt ____________________________________________  RADIOLOGY  I personally reviewed all radiographic images ordered to evaluate for the above acute complaints and reviewed radiology reports and findings.  These findings were personally discussed with the patient.  Please see medical record for radiology report.  ____________________________________________   PROCEDURES  Procedure(s) performed:  Procedure Name: Intubation Date/Time: 07/08/2021 1:43 PM Performed by: Merlyn Lot, MD Pre-anesthesia Checklist: Patient identified, Emergency Drugs available, Suction available and Patient being monitored Preoxygenation: Pre-oxygenation with 100% oxygen Induction Type: IV induction and Rapid sequence Laryngoscope Size: Glidescope and 4 Grade View: Grade III Tube size: 7.5 mm Number of attempts: 1 Airway Equipment and Method: Video-laryngoscopy Placement Confirmation: ETT inserted through vocal cords under direct vision, CO2 detector and Breath sounds checked- equal and bilateral Secured at: 22 cm Tube secured with: ETT holder Dental Injury: Teeth and Oropharynx as per pre-operative assessment     .Critical Care Performed by: Merlyn Lot, MD Authorized by: Merlyn Lot, MD   Critical care provider statement:    Critical care time (minutes):  40   Critical care was necessary to treat or prevent imminent or life-threatening deterioration of the following conditions:  Sepsis and respiratory failure   Critical care was time spent personally by me on the following activities:  Ordering and performing treatments and interventions, ordering and review of laboratory studies, ordering and review of radiographic studies, pulse oximetry, re-evaluation of patient's condition, review of old charts, obtaining history from patient or surrogate, examination of patient, evaluation of patient's response to  treatment, discussions with primary provider, discussions with consultants and development of treatment plan with patient or surrogate    Critical Care performed: yes ____________________________________________   INITIAL IMPRESSION / [place] / ED COURSE  Pertinent labs & imaging results that were available during my care of the patient were reviewed by me and considered in my medical decision making (see chart for details).   DDX: Asthma, copd, CHF, pna, ptx, malignancy, Pe, anemia, sepsis, cellulitis,   Craig [name] is a 30 y.o. who presents to the ED with presentation as described above.  Patient with new O2 requirement is clinically ill-appearing with known lung disease.  Do suspect recurrent pneumonia possible PE.  Will give IV fluids for tachycardia.  Will check blood work.  Is currently protecting his airway mentating appropriately.  The patient will be placed on continuous pulse oximetry and telemetry for monitoring.  Laboratory evaluation will be sent to evaluate for the above complaints.     Clinical Course as of 07/08/21 1539  Sat Jul 08, 2021  1019 In addition nausea and shortness of breath patient is also complaining of some abdominal pain.  Given abnormal chest x-ray with his new hypoxia and is essentially bedbound will order CTA to evaluate for PE but also CT abdomen pelvis to evaluate for other source of  infection. [PR]  1213 Able to wean patient down to 4 L nasal cannula [PR]  5170 Still awaiting CT imaging [PR]  0174 Patient returned from CT unresponsive.  He went to CT mentating and answering appropriately.  Return back below with agonal respirations.  SPO2 of 19.  We were able to bag him back up to 91% but patient still minimally responsive.  At that point patient was intubated without complication.  Will consult ICU. [PR]      Clinical Course User Index [PR] Merlyn Lot, MD    The patient was evaluated in Emergency Department today for the  symptoms described in the history of present illness. He/she was evaluated in the context of the global COVID-19 pandemic, which necessitated consideration that the patient might be at risk for infection with the SARS-CoV-2 virus that causes COVID-19. Institutional protocols and algorithms that pertain to the evaluation of patients at risk for COVID-19 are in a state of rapid change based on information released by regulatory bodies including the CDC and federal and state organizations. These policies and algorithms were followed during the patient's care in the ED.  As part of my medical decision making, I reviewed the following data within the [place] notes reviewed and incorporated, Labs reviewed, notes from prior ED visits and [place] Controlled Substance Database   ____________________________________________   FINAL CLINICAL IMPRESSION(S) / ED DIAGNOSES  Final diagnoses:  Acute respiratory failure with hypoxia ([place])  Sepsis with acute hypoxic respiratory failure, due to unspecified organism, unspecified whether septic shock present ([place])      NEW MEDICATIONS STARTED DURING THIS VISIT:  New Prescriptions   No medications on file     Note:  This document was prepared using Dragon voice recognition software and may include unintentional dictation errors.    Merlyn Lot, MD 07/08/21 1539

## 2021-07-08 NOTE — Progress Notes (Signed)
[facility] following for sepsis protocol. 

## 2021-07-08 NOTE — Progress Notes (Signed)
PHARMACY -  BRIEF ANTIBIOTIC NOTE   Pharmacy has received consult(s) for vancomycin and cefepime from an ED provider.  The patient's profile has been reviewed for ht/wt/allergies/indication/available labs.    One time order(s) placed for vancomycin 1,000 mg x 1 and cefepime 2 grams x 1  Further antibiotics/pharmacy consults should be ordered by admitting physician if indicated.                       Thank you, Wynelle Cleveland, PharmD Pharmacy Resident  07/08/2021 9:38 AM
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
## 2021-07-09 ENCOUNTER — Inpatient Hospital Stay (HOSPITAL_COMMUNITY)
Admit: 2021-07-09 | Discharge: 2021-07-09 | Disposition: A | Discharge status: Home / Self Care | Payer: Medicaid Other | Attending: Internal Medicine | Admitting: Internal Medicine

## 2021-07-09 DIAGNOSIS — J9621 Acute and chronic respiratory failure with hypoxia: Secondary | ICD-10-CM | POA: Diagnosis not present

## 2021-07-09 DIAGNOSIS — R7881 Bacteremia: Secondary | ICD-10-CM

## 2021-07-09 LAB — PROCALCITONIN: Procalcitonin: 1.18 ng/mL

## 2021-07-09 LAB — BASIC METABOLIC PANEL
Anion gap: 11 (ref 5–15)
BUN: <5 mg/dL — ABNORMAL LOW (ref 6–20)
CO2: 37 mmol/L — ABNORMAL HIGH (ref 22–32)
Calcium: 8.2 mg/dL — ABNORMAL LOW (ref 8.9–10.3)
Chloride: 86 mmol/L — ABNORMAL LOW (ref 98–111)
Creatinine, Ser: 0.51 mg/dL — ABNORMAL LOW (ref 0.61–1.24)
GFR, Estimated: >60 mL/min (ref >60)
Glucose, Bld: 294 mg/dL — ABNORMAL HIGH (ref 70–99)
Potassium: 4.2 mmol/L (ref 3.5–5.1)
Sodium: 134 mmol/L — ABNORMAL LOW (ref 135–145)

## 2021-07-09 LAB — CBC
HCT: 29.2 % — ABNORMAL LOW (ref 39.0–52.0)
Hemoglobin: 8.9 g/dL — ABNORMAL LOW (ref 13.0–17.0)
MCH: 28.8 pg (ref 26.0–34.0)
MCHC: 30.5 g/dL (ref 30.0–36.0)
MCV: 94.5 fL (ref 80.0–100.0)
Platelets: 178 10*3/uL (ref 150–400)
RBC: 3.09 MIL/uL — ABNORMAL LOW (ref 4.22–5.81)
RDW: 18.2 % — ABNORMAL HIGH (ref 11.5–15.5)
WBC: 9.9 10*3/uL (ref 4.0–10.5)
nRBC: 0.2 % (ref 0.0–0.2)

## 2021-07-09 LAB — TRIGLYCERIDES: Triglycerides: 199 mg/dL — ABNORMAL HIGH (ref <150)

## 2021-07-09 LAB — BLOOD CULTURE ID PANEL (REFLEXED) - BCID2

## 2021-07-09 LAB — BLOOD GAS, ARTERIAL
Acid-Base Excess: 16.5 mmol/L — ABNORMAL HIGH (ref 0.0–2.0)
Bicarbonate: 42.4 mmol/L — ABNORMAL HIGH (ref 20.0–28.0)
FIO2: 35
MECHVT: 500 mL
Mechanical Rate: 18
O2 Saturation: 97.7 %
PEEP: 8 cmH2O
Patient temperature: 37
pCO2 arterial: 57 mmHg — ABNORMAL HIGH (ref 32.0–48.0)
pH, Arterial: 7.48 — ABNORMAL HIGH (ref 7.350–7.450)
pO2, Arterial: 93 mmHg (ref 83.0–108.0)

## 2021-07-09 LAB — ECHOCARDIOGRAM COMPLETE BUBBLE STUDY
AR max vel: 2.66 cm2
AV Peak grad: 4.9 mmHg
Ao pk vel: 1.11 m/s
Area-P 1/2: 3.58 cm2
S' Lateral: 2.9 cm

## 2021-07-09 LAB — GLUCOSE, CAPILLARY
Glucose-Capillary: 282 mg/dL — ABNORMAL HIGH (ref 70–99)
Glucose-Capillary: 286 mg/dL — ABNORMAL HIGH (ref 70–99)
Glucose-Capillary: 273 mg/dL — ABNORMAL HIGH (ref 70–99)
Glucose-Capillary: 251 mg/dL — ABNORMAL HIGH (ref 70–99)
Glucose-Capillary: 230 mg/dL — ABNORMAL HIGH (ref 70–99)
Glucose-Capillary: 242 mg/dL — ABNORMAL HIGH (ref 70–99)

## 2021-07-09 LAB — RESPIRATORY PANEL BY PCR

## 2021-07-09 LAB — MAGNESIUM: Magnesium: 2.4 mg/dL (ref 1.7–2.4)

## 2021-07-09 LAB — PHOSPHORUS: Phosphorus: 2.8 mg/dL (ref 2.5–4.6)

## 2021-07-09 LAB — HEMOGLOBIN A1C
Hgb A1c MFr Bld: 6.8 % — ABNORMAL HIGH (ref 4.8–5.6)
Mean Plasma Glucose: 148.46 mg/dL

## 2021-07-09 LAB — HIV ANTIBODY (ROUTINE TESTING W REFLEX): HIV Screen 4th Generation wRfx: NONREACTIVE

## 2021-07-09 LAB — ALBUMIN: Albumin: 2.9 g/dL — ABNORMAL LOW (ref 3.5–5.0)

## 2021-07-09 LAB — HEPARIN LEVEL (UNFRACTIONATED): Heparin Unfractionated: <0.1 IU/mL — ABNORMAL LOW (ref 0.30–0.70)

## 2021-07-09 MED ORDER — INSULIN GLARGINE-YFGN 100 UNIT/ML ~~LOC~~ SOLN
10.0000 [IU] | Freq: Every day | SUBCUTANEOUS | Status: DC
  Filled 2021-07-09: qty 0.1

## 2021-07-09 MED ORDER — SODIUM CHLORIDE 0.9 % IV SOLN
2.0000 g | Freq: Three times a day (TID) | INTRAVENOUS | Status: DC
  Administered 2021-07-09 – 2021-07-10 (×3): 2 g via INTRAVENOUS
  Filled 2021-07-09 (×5): qty 2

## 2021-07-09 MED ORDER — FUROSEMIDE 10 MG/ML IJ SOLN
80.0000 mg | Freq: Once | INTRAMUSCULAR | Status: AC
  Administered 2021-07-09: 80 mg via INTRAVENOUS
  Filled 2021-07-09: qty 8

## 2021-07-09 MED ORDER — FENTANYL CITRATE PF 50 MCG/ML IJ SOSY: 50.0000 ug | PREFILLED_SYRINGE | INTRAMUSCULAR | Status: DC | PRN

## 2021-07-09 MED ORDER — HEPARIN BOLUS VIA INFUSION
3000.0000 [IU] | Freq: Once | INTRAVENOUS | Status: AC
  Administered 2021-07-09: 3000 [IU] via INTRAVENOUS
  Filled 2021-07-09: qty 3000

## 2021-07-09 MED ORDER — PERFLUTREN LIPID MICROSPHERE
1.0000 mL | INTRAVENOUS | Status: AC | PRN
Start: 2021-07-09 — End: 2021-07-09
  Administered 2021-07-09: 3.5 mL via INTRAVENOUS
  Filled 2021-07-09: qty 10

## 2021-07-09 MED ORDER — INSULIN GLARGINE-YFGN 100 UNIT/ML ~~LOC~~ SOLN
15.0000 [IU] | Freq: Every day | SUBCUTANEOUS | Status: DC
  Administered 2021-07-09: 15 [IU] via SUBCUTANEOUS
  Filled 2021-07-09: qty 0.15

## 2021-07-09 MED ORDER — MIDAZOLAM HCL 2 MG/2ML IJ SOLN
2.0000 mg | Freq: Once | INTRAMUSCULAR | Status: AC
  Administered 2021-07-09: 2 mg via INTRAVENOUS

## 2021-07-09 MED ORDER — FUROSEMIDE 10 MG/ML IJ SOLN
80.0000 mg | Freq: Once | INTRAMUSCULAR | Status: DC
Start: 2021-07-09 — End: 2021-07-09

## 2021-07-09 MED ORDER — VITAL HIGH PROTEIN PO LIQD
1000.0000 mL | ORAL | Status: AC
  Administered 2021-07-09: 1000 mL

## 2021-07-09 MED ORDER — COVID-19MRNA BIVAL VAC MODERNA 50 MCG/0.5ML IM SUSP
0.5000 mL | Freq: Once | INTRAMUSCULAR | Status: DC
  Filled 2021-07-09: qty 0.5

## 2021-07-09 MED ORDER — MIDAZOLAM HCL 2 MG/2ML IJ SOLN
INTRAMUSCULAR | Status: AC
  Filled 2021-07-09: qty 2

## 2021-07-09 MED ORDER — SODIUM CHLORIDE 0.9 % IV SOLN
250.0000 mL | INTRAVENOUS | Status: DC
  Administered 2021-07-09 – 2021-07-11 (×3): 250 mL via INTRAVENOUS

## 2021-07-09 MED ORDER — PREDNISONE 10 MG PO TABS
10.0000 mg | ORAL_TABLET | Freq: Every day | ORAL | Status: DC
  Administered 2021-07-12 – 2021-07-13 (×2): 10 mg
  Filled 2021-07-09 (×2): qty 1

## 2021-07-09 MED ORDER — CEFAZOLIN SODIUM-DEXTROSE 2-4 GM/100ML-% IV SOLN
2.0000 g | Freq: Three times a day (TID) | INTRAVENOUS | Status: DC
  Administered 2021-07-09 – 2021-07-10 (×4): 2 g via INTRAVENOUS
  Filled 2021-07-09 (×6): qty 100

## 2021-07-09 MED ORDER — DEXMEDETOMIDINE HCL IN NACL 400 MCG/100ML IV SOLN
0.0000 ug/kg/h | INTRAVENOUS | Status: DC
  Administered 2021-07-09: 0.4 ug/kg/h via INTRAVENOUS
  Administered 2021-07-09: 1.2 ug/kg/h via INTRAVENOUS
  Administered 2021-07-09: 0.8 ug/kg/h via INTRAVENOUS
  Administered 2021-07-09: 1.2 ug/kg/h via INTRAVENOUS
  Administered 2021-07-09: 0.8 ug/kg/h via INTRAVENOUS
  Administered 2021-07-10 (×2): 1.2 ug/kg/h via INTRAVENOUS
  Filled 2021-07-09 (×9): qty 100

## 2021-07-09 MED ORDER — MIDAZOLAM HCL 2 MG/2ML IJ SOLN
2.0000 mg | INTRAMUSCULAR | Status: AC | PRN
  Administered 2021-07-10 – 2021-07-12 (×3): 2 mg via INTRAVENOUS
  Filled 2021-07-09 (×3): qty 2

## 2021-07-09 MED ORDER — POLYETHYLENE GLYCOL 3350 17 G PO PACK: 17.0000 g | PACK | Freq: Every day | ORAL | Status: DC

## 2021-07-09 MED ORDER — MIDAZOLAM HCL 2 MG/2ML IJ SOLN: 2.0000 mg | INTRAMUSCULAR | Status: DC | PRN

## 2021-07-09 MED ORDER — FREE WATER
30.0000 mL | Status: DC
  Administered 2021-07-09 – 2021-07-10 (×4): 30 mL

## 2021-07-09 MED ORDER — DOCUSATE SODIUM 50 MG/5ML PO LIQD: 100.0000 mg | Freq: Two times a day (BID) | ORAL | Status: DC

## 2021-07-09 MED ORDER — VITAL AF 1.2 CAL PO LIQD
1000.0000 mL | ORAL | Status: DC
  Administered 2021-07-09: 1000 mL

## 2021-07-09 MED ORDER — NOREPINEPHRINE 4 MG/250ML-% IV SOLN
2.0000 ug/min | INTRAVENOUS | Status: DC
  Administered 2021-07-09: 2 ug/min via INTRAVENOUS
  Filled 2021-07-09: qty 250

## 2021-07-09 MED ORDER — FENTANYL CITRATE PF 50 MCG/ML IJ SOSY
50.0000 ug | PREFILLED_SYRINGE | INTRAMUSCULAR | Status: DC | PRN
  Administered 2021-07-09: 60 ug via INTRAVENOUS

## 2021-07-09 MED ORDER — FOLIC ACID 1 MG PO TABS
1.0000 mg | ORAL_TABLET | Freq: Every day | ORAL | Status: DC
Start: 2021-07-10 — End: 2021-07-16
  Administered 2021-07-12 – 2021-07-16 (×6): 1 mg
  Filled 2021-07-09 (×5): qty 1

## 2021-07-09 MED ORDER — HEPARIN SODIUM (PORCINE) 5000 UNIT/ML IJ SOLN
5000.0000 [IU] | Freq: Three times a day (TID) | INTRAMUSCULAR | Status: DC
  Administered 2021-07-09 – 2021-07-14 (×15): 5000 [IU] via SUBCUTANEOUS
  Filled 2021-07-09 (×15): qty 1

## 2021-07-09 MED ORDER — INSULIN ASPART 100 UNIT/ML IJ SOLN
4.0000 [IU] | Freq: Once | INTRAMUSCULAR | Status: AC
  Administered 2021-07-09: 4 [IU] via SUBCUTANEOUS
  Filled 2021-07-09: qty 1

## 2021-07-09 MED ORDER — INSULIN GLARGINE-YFGN 100 UNIT/ML ~~LOC~~ SOLN
15.0000 [IU] | Freq: Two times a day (BID) | SUBCUTANEOUS | Status: DC
  Administered 2021-07-09 – 2021-07-15 (×10): 15 [IU] via SUBCUTANEOUS
  Filled 2021-07-09 (×15): qty 0.15

## 2021-07-09 NOTE — Progress Notes (Signed)
*  PRELIMINARY RESULTS* Echocardiogram 2D Echocardiogram has been performed. A Bubble Study (Saline Microcavitation Study) was requested and completed. Definity IV Contrast used on this study.  Craig [name] 07/09/2021, 12:25 PM
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
## 2021-07-09 NOTE — Progress Notes (Signed)
PHARMACY - PHYSICIAN COMMUNICATION CRITICAL VALUE ALERT - BLOOD CULTURE IDENTIFICATION (BCID)  Results for orders placed or performed during the hospital encounter of 11/22/19  Blood Culture ID Panel (Reflexed) (Collected: 11/22/2019 11:55 AM)  Result Value Ref Range   Enterococcus species NOT DETECTED NOT DETECTED   Listeria monocytogenes NOT DETECTED NOT DETECTED   Staphylococcus species NOT DETECTED NOT DETECTED   Staphylococcus aureus (BCID) NOT DETECTED NOT DETECTED   Streptococcus species NOT DETECTED NOT DETECTED   Streptococcus agalactiae NOT DETECTED NOT DETECTED   Streptococcus pneumoniae NOT DETECTED NOT DETECTED   Streptococcus pyogenes NOT DETECTED NOT DETECTED   Acinetobacter baumannii NOT DETECTED NOT DETECTED   Enterobacteriaceae species NOT DETECTED NOT DETECTED   Enterobacter cloacae complex NOT DETECTED NOT DETECTED   Escherichia coli NOT DETECTED NOT DETECTED   Klebsiella oxytoca NOT DETECTED NOT DETECTED   Klebsiella pneumoniae NOT DETECTED NOT DETECTED   Proteus species NOT DETECTED NOT DETECTED   Serratia marcescens NOT DETECTED NOT DETECTED   Haemophilus influenzae NOT DETECTED NOT DETECTED   Neisseria meningitidis NOT DETECTED NOT DETECTED   Pseudomonas aeruginosa NOT DETECTED NOT DETECTED   Candida albicans NOT DETECTED NOT DETECTED   Candida glabrata NOT DETECTED NOT DETECTED   Candida krusei NOT DETECTED NOT DETECTED   Candida parapsilosis NOT DETECTED NOT DETECTED   Candida tropicalis NOT DETECTED NOT DETECTED   BCID: 1 of 4 with Staph Aureus, no resistance  Name of provider contacted: B. Rust-Chester, NP  Changes to prescribed antibiotics required: Transition from Vancomycin and Cefepime to Cefazolin.  Renda Rolls, PharmD, [facility] 07/09/2021 3:13 AM

## 2021-07-09 NOTE — Progress Notes (Signed)
PHARMACY CONSULT NOTE - FOLLOW UP  Pharmacy Consult for Electrolyte Monitoring and Replacement   Recent Labs: Potassium (mmol/L)  Date Value  07/09/2021 4.2   Magnesium (mg/dL)  Date Value  07/09/2021 2.4   Calcium (mg/dL)  Date Value  07/09/2021 8.2 (L)   Albumin (g/dL)  Date Value  07/08/2021 3.0 (L)  07/08/2021 2.9 (L)   Phosphorus (mg/dL)  Date Value  07/09/2021 2.8   Sodium (mmol/L)  Date Value  07/09/2021 134 (L)     Assessment: 30YOM presenting with shortness of breath and sepsis. Currently intubated. Found to have MSSA bacteremia. PMH includes PE and MI.  Goal of Therapy:  Electrolytes WNL  Plan:  --No replacement indicated at this time --Continue to follow along  Tawnya Crook, PharmD, BCPS Clinical Pharmacist 07/09/2021 7:22 AM

## 2021-07-09 NOTE — Progress Notes (Signed)
NAME:  Craig [name], MRN:  [id number], DOB:  26-May-1991, LOS: 1 ADMISSION DATE:  07/08/2021, CONSULTATION DATE:  07/08/2021 REFERRING MD:  Merlyn Lot MD CHIEF COMPLAINT:  Altered Mental Status    HPI  30 y.o. male with significant PMH of extensive medical history including inflammatory arthritis on methotrexate and chronic prednisone at 10 mg daily, prior severe Covid-19 infection requiring prolonged hospitalization and tracheostomy (since decannulated) Dec 2021 - Feb 2022, chronic hypoxic and hypercarbic respiratory failure on 2L O2 PRN, MSSA bacteremia, MSSA pneumonia, perforated diverticulitis s/p partial colectomy with ostomy creation, morbid obesity and gout who presented to the ED with chief complaints of progressive shortness of breath.  Patient is currently intubated, history mostly obtained from patient's chart and patient's wife who is currently at the bedside.  Per patient's wife, EMS was called today due to worsening shortness of breath and intermittent altered mental status.  [name] stated the patient has been complaining of right knee pain was prescribed Percocet on Monday.  She took the Percocet at Monday and noticed worsening shortness of breath with mental status change per wife.  He also takes tramadol for pain.  No reports of chest pain, nausea vomiting, abdominal pain, diaphoresis, fevers or chills, cough or any other focal neurological symptoms or deficit.  On EMS arrival patient was noted to be hypoxic with a heart rate between 140 to 150 bpm.  He was placed on 6 L to maintain SPO2 greater than 90% and transported to the ED.  ED Course: On arrival to the ED, he was afebrile with blood pressure 134/57 mm Hg and pulse rate 140 beats/min. Respirations 28 breaths per minutes with sats 92 % on 6L. There were no focal obvious neurological deficits.  Patient appeared peripherally cyanotic and was noted with agonal respiratory pattern.  He was intubated in the ED due  to persistent hypoxemia and worsening respiratory and mental status. Pertinent Labs in Red/Diagnostics Findings: Na+/ K+: 134/3.4 Glucose: 287 BUN/Cr.: <5/0.50 CO2: 41 Calcium: 9.0 AST/ALT:30/44   WBC/ TMAX: 9.8/ afebrile Hgb/Hct: 9.9/33.6 Plts: 201 PCT: 0.33 Lactic acid: 3.5 COVID PCR: NEGATIVE   Troponin: 29>55 BNP: 28.7 Venous Blood Gas result:  pO2 49.0; pCO2 67; pH 7.44;  HCO3 45.5, %O2 Sat 85.7. EKG: normal EKG, normal sinus rhythm, unchanged from previous tracings, sinus tachycardia rate:140bmp  PCCM consulted for admission and further management.  On exam in the ED,  patient was noted to have seizure-like activity lasting less than 60 seconds.  Patient noted with facial twitching and jerking movement of the torso with increased heart rate in the 150s beats per minute.  Patient received 2 mg of IV Ativan. Past Medical History  Inflammatory arthritis on methotrexate and chronic prednisone at 10 mg daily Prior severe Covid-19 infection requiring prolonged hospitalization Dec 2021 to Feb 2022 and tracheostomy (since decannulated) Chronic hypoxic and hypercarbic respiratory failure on 2L O2 PRN MSSA bacteremia MSSA pneumonia Perforated diverticulitis s/p partial colectomy with ostomy creation Morbid obesity Gout  Significant Hospital Events   11/19: Admitted with acute on chronic hypoxic hypercapnic respiratory failure  Consults:  Neurology Infectious Disease  Procedures:  None  Significant Diagnostic Tests:  11/19: Chest Xray>Mild irregular opacities bilaterally which may be residual/chronic 11/19: Noncontrast CT head>Pending 11/19: CTA abdomen and pelvis>No acute process identified in the abdomen or pelvis. 2. Hepatomegaly and steatosis. 11/19: CTA Chest>No pulmonary embolism identified. Somewhat limited evaluation due to motion. 2. No acute consolidations/infiltrate identified in the lungs. Similar appearance and distribution of  irregular and consolidative  densities bilaterally.  Micro Data:  11/19: SARS-CoV-2 PCR> negative 11/19: Influenza PCR> negative 11/19: Blood culture x2>+MSSA 11/19: Urine Culture> 11/19: MRSA PCR>neg 11/19: Strep pneumo urinary antigen> 11/19: Legionella urinary antigen>  Antimicrobials:  Azithromycin 11/19 Vancomycin 11/19 Cefepime 11/19>> Ancef 11/20>>  OBJECTIVE  Blood pressure 121/62, pulse 97, temperature 99.1 F (37.3 C), resp. rate 17, height '6\' 1"'  (1.854 m), weight (!) 145.6 kg, SpO2 94 %.    Vent Mode: PRVC FiO2 (%):  [35 %-100 %] 35 % Set Rate:  [16 bmp-18 bmp] 16 bmp Vt Set:  [500 mL-550 mL] 500 mL PEEP:  [5 cmH20-8 cmH20] 8 cmH20 Plateau Pressure:  [21 cmH20] 21 cmH20   Intake/Output Summary (Last 24 hours) at 07/09/2021 0901 Last data filed at 07/09/2021 0844 Gross per 24 hour  Intake 3449.09 ml  Output 2450 ml  Net 999.09 ml   Filed Weights   07/08/21 0822 07/08/21 1619 07/09/21 0344  Weight: 127 kg (!) 145 kg (!) 145.6 kg   Physical Examination  GENERAL:  obese Caucasian male, intubated and sedated EYES: Pupils 4 mm, equal and round LUNGS: CTA b/l, no W/C/R CARDIOVASCULAR: RRR, no MRG  ABDOMEN: Obese, soft, nondistended. Bowel sounds present. +ostomy EXTREMITIES: 4+ pitting of BLE NEUROLOGIC: moves all extremities, not following commands SKIN: multiple skin breakdown as below in sacral, groin and abdominal area       Labs/imaging that I havepersonally reviewed  (right click and "Reselect all SmartList Selections" daily)     Labs   CBC: Recent Labs  Lab 07/08/21 0821 07/09/21 0129  WBC 9.8 9.9  NEUTROABS 7.4  --   HGB 9.9* 8.9*  HCT 33.6* 29.2*  MCV 96.0 94.5  PLT 201 916    Basic Metabolic Panel: Recent Labs  Lab 07/08/21 0821 07/08/21 1829 07/09/21 0129  NA 134*  --  134*  K 3.4*  --  4.2  CL 84*  --  86*  CO2 41*  --  37*  GLUCOSE 287*  --  294*  BUN <5*  --  <5*  CREATININE 0.50*  --  0.51*  CALCIUM 9.0  --  8.2*  MG  --  1.5* 2.4  PHOS  --   1.2* 2.8   GFR: Estimated Creatinine Clearance: 199.1 mL/min (A) (by C-G formula based on SCr of 0.51 mg/dL (L)). Recent Labs  Lab 07/08/21 0821 07/08/21 1203 07/08/21 1448 07/09/21 0129  PROCALCITON  --   --  0.33 1.18  WBC 9.8  --   --  9.9  LATICACIDVEN 3.5* 1.9  --   --     Liver Function Tests: Recent Labs  Lab 07/08/21 0821  AST 30  ALT 44  ALKPHOS 56  BILITOT 1.2  PROT 6.4*  ALBUMIN 2.9*  3.0*   No results for input(s): LIPASE, AMYLASE in the last 168 hours. No results for input(s): AMMONIA in the last 168 hours.  ABG    Component Value Date/Time   PHART 7.48 (H) 07/09/2021 0452   PCO2ART 57 (H) 07/09/2021 0452   PO2ART 93 07/09/2021 0452   HCO3 42.4 (H) 07/09/2021 0452   O2SAT 97.7 07/09/2021 0452     Coagulation Profile: Recent Labs  Lab 07/08/21 1829  INR 1.0    Cardiac Enzymes: No results for input(s): CKTOTAL, CKMB, CKMBINDEX, TROPONINI in the last 168 hours.  HbA1C: Hgb A1c MFr Bld  Date/Time Value Ref Range Status  08/10/2020 04:33 AM 5.7 (H) 4.8 - 5.6 % Final  Comment:    (NOTE) Pre diabetes:          5.7%-6.4%  Diabetes:              >6.4%  Glycemic control for   <7.0% adults with diabetes     CBG: Recent Labs  Lab 07/08/21 1937 07/08/21 2339 07/09/21 0316 07/09/21 0724  GLUCAP 215* 231* 282* 286*    Review of Systems:   UNABLE TO OBTAIN DUE TO PATIENT CURRENTLY INTUBATED AND SEDATED  Past Medical History  He,  has a past medical history of Gout, Hypertension, and Rupture of bowel ([place]).   Surgical History    Past Surgical History:  Procedure Laterality Date   COLONOSCOPY WITH PROPOFOL N/A 05/18/2020   Procedure: COLONOSCOPY WITH PROPOFOL;  Surgeon: Benjamine Sprague, DO;  Location: [facility];  Service: General;  Laterality: N/A;   COLOSTOMY N/A 11/22/2019   Procedure: COLOSTOMY;  Surgeon: Herbert Pun, MD;  Location: [facility] ORS;  Service: General;  Laterality: N/A;   LAPAROTOMY N/A 11/22/2019   Procedure:  EXPLORATORY LAPAROTOMY;  Surgeon: Herbert Pun, MD;  Location: [facility] OR[facility];  Service: General;  Laterality: N/A;   LAPAROTOMY N/A 07/08/2020   Procedure: EXPLORATORY LAPAROTOMY;  Surgeon: Herbert Pun, MD;  Location: [facility] OR[facility];  Service: General;  Laterality: N/A;   PARTIAL COLECTOMY N/A 11/22/2019   Procedure: PARTIAL COLECTOMY;  Surgeon: Herbert Pun, MD;  Location: [facility] OR[facility];  Service: General;  Laterality: N/A;   PEG PLACEMENT N/A 09/14/2020   Procedure: PERCUTANEOUS ENDOSCOPIC GASTROSTOMY (PEG) PLACEMENT;  Surgeon: Benjamine Sprague, DO;  Location: [facility];  Service: General;  Laterality: N/A;  TRAVEL CASE TRACH Rudolph N/A 09/13/2020   Procedure: TRACHEOSTOMY;  Surgeon: Beverly Gust, MD;  Location: [facility];  Service: ENT;  Laterality: N/A;   XI ROBOTIC ASSISTED COLOSTOMY TAKEDOWN N/A 06/28/2020   Procedure: XI ROBOTIC ASSISTED COLOSTOMY TAKEDOWN CONVERTED TO OPEN PROCEDURE;  Surgeon: Herbert Pun, MD;  Location: [facility] OR[facility];  Service: General;  Laterality: N/A;     Social History   reports that he has never smoked. His smokeless tobacco use includes chew. He reports current alcohol use. He reports that he does not use drugs.   Family History   His family history includes Healthy in his father and mother.   Allergies Allergies  Allergen Reactions   Amoxicillin Rash    Tolerated cefepime and cefazolin 08/2020.     Home Medications  Prior to Admission medications   Medication Sig Start Date End Date Taking? Authorizing Provider  acetaminophen (TYLENOL) 650 MG CR tablet Take by mouth. 10/14/20  Yes [provider]  albuterol (ACCUNEB) 1.25 MG/3ML nebulizer solution Take by nebulization. 03/07/21  Yes [provider]  allopurinol (ZYLOPRIM) 300 MG tablet Take 300 mg by mouth daily. 02/09/21  Yes [provider]  budesonide (PULMICORT) 0.25 MG/2ML nebulizer solution Inhale into the lungs.  03/07/21 03/07/22 Yes [provider]  Cholecalciferol (VITAMIN D3) 25 MCG (1000 UT) CAPS Take by mouth.   Yes [provider]  Cyanocobalamin (VITAMIN B-12 IJ) Inject as directed.   Yes [provider]  cyanocobalamin 1000 MCG tablet Take 1,000 mcg by mouth daily.   Yes [provider]  DULoxetine (CYMBALTA) 60 MG capsule Take by mouth. 11/14/20 11/14/21 Yes [provider]  famotidine (PEPCID) 20 MG tablet Take by mouth. 10/14/20  Yes [provider]  folic acid (FOLVITE) 1 MG tablet Take by mouth. 03/01/21 03/01/22 Yes [provider]  furosemide (LASIX)  20 MG tablet Take 40 mg by mouth daily. Taking 2 tablets once daily 03/07/21  Yes [provider]  ibuprofen (ADVIL) 600 MG tablet Take 600 mg by mouth every 6 (six) hours as needed.   Yes [provider]  lidocaine (LIDODERM) 5 % 1 patch daily as needed. 07/07/21  Yes [provider]  lisinopril (ZESTRIL) 5 MG tablet Take by mouth. 11/07/20  Yes [provider]  metoprolol succinate (TOPROL-XL) 100 MG 24 hr tablet Take by mouth. 01/05/21 01/05/22 Yes [provider]  Multiple Vitamins-Minerals (MULTIVITAMIN WITH MINERALS) tablet Take 1 tablet by mouth daily.   Yes [provider]  Omega-3 Fatty Acids (FISH OIL) 1000 MG CAPS Take by mouth.   Yes [provider]  ondansetron (ZOFRAN) 4 MG tablet Take by mouth. 10/14/20  Yes [provider]  potassium chloride (KLOR-CON) 10 MEQ tablet Take 10 mEq by mouth daily. 04/27/21  Yes [provider]  predniSONE (DELTASONE) 20 MG tablet Take 20 mg by mouth daily. 03/01/21  Yes [provider]  pregabalin (LYRICA) 200 MG capsule Take by mouth. 03/01/21 03/01/22 Yes [provider]  rivaroxaban (XARELTO) 20 MG TABS tablet Take by mouth. 11/07/20  Yes [provider]  traMADol (ULTRAM) 50 MG tablet Take by mouth. 03/16/21  Yes [provider]   cetirizine (ZYRTEC) 10 MG tablet Take 10 mg by mouth daily. Patient not taking: Reported on 07/08/2021    [provider]  clonazePAM (KLONOPIN) 1 MG tablet Take by mouth. Patient not taking: Reported on 07/08/2021 10/14/20   [provider]  [employer] SALINE NASAL SPRAY 0.65 % nasal spray SMARTSIG:1 Spray(s) Both Nares Every 4 Hours PRN 10/14/20   [provider]  doxazosin (CARDURA) 1 MG tablet Take by mouth. Patient not taking: Reported on 07/08/2021 11/07/20   [provider]  doxycycline (VIBRAMYCIN) 100 MG capsule Take 100 mg by mouth 2 (two) times daily. 01/06/21   [provider]  fluticasone-salmeterol (ADVAIR) 100-50 MCG/ACT AEPB Inhale into the lungs. Patient not taking: Reported on 03/08/2021 10/27/20 10/27/21  [provider]  HYDROcodone-acetaminophen (NORCO/VICODIN) 5-325 MG tablet Take 1 tablet by mouth every 6 (six) hours as needed. Patient not taking: Reported on 03/27/2021 10/17/20   [provider]  melatonin 3 MG TABS tablet Take 6 mg by mouth at bedtime as needed. Patient not taking: Reported on 07/08/2021 10/14/20   [provider]  methotrexate (RHEUMATREX) 2.5 MG tablet Take by mouth. 03/01/21   [provider]  [place]     [provider]  OXYGEN Inhale into the lungs. 1.5 L with sleep    [provider]  predniSONE (DELTASONE) 5 MG tablet 6 pills x1day, 5x1,4x1,3x1,2x1,1x1 Patient not taking: Reported on 03/08/2021 02/06/21   [provider]  Scheduled Meds:  chlorhexidine gluconate (MEDLINE KIT)  15 mL Mouth Rinse BID   Chlorhexidine Gluconate Cloth  6 each Topical Q0600   docusate  100 mg Per Tube BID   [START ON 00/76/2263] folic acid  1 mg Per Tube Daily   insulin aspart  0-20 Units Subcutaneous Q4H   insulin glargine-yfgn  15 Units Subcutaneous Daily   mouth rinse  15 mL Mouth Rinse 10 times per day   polyethylene glycol  17 g Per Tube Daily    [START ON 07/10/2021] predniSONE  10 mg Per Tube Q breakfast   Continuous Infusions:  sodium chloride Stopped (07/09/21 0351)   sodium chloride Stopped (07/09/21 0742)  ceFAZolin (ANCEF) IV Stopped (07/09/21 0541)   ceFEPime (MAXIPIME) IV     famotidine (PEPCID) IV Stopped (07/08/21 2103)   fentaNYL infusion INTRAVENOUS 30 mcg/hr (07/09/21 0800)   heparin 2,100 Units/hr (07/09/21 0800)   levETIRAcetam Stopped (07/09/21 0005)   norepinephrine (LEVOPHED) Adult infusion 4 mcg/min (07/09/21 0800)   propofol (DIPRIVAN) infusion 20 mcg/kg/min (07/09/21 0800)   PRN Meds:.sodium chloride, docusate sodium, fentaNYL, levalbuterol, polyethylene glycol   Active Hospital Problem list   Acute hypoxic hypercapnic respiratory failure Pneumonia Metabolic encephalopathy Seizure-like activity  Assessment & Plan:  Acute on chronic hypoxic and hypercarbic respiratory failure PMHx: Covid-19 pneumonia, MSSA pneumonia, Morbid obesity, probable OHS/OSA -continue ventilator support & lung protective strategies -Wean PEEP & FiO2 as tolerated, maintain SpO2 > 90% -Head of bed elevated 30 degrees, VAP protocol in place -Plateau pressures less than 30 cm H20  -Intermittent chest x-ray & ABG PRN -Daily WUA with SBT per protocol -Ensure adequate pulmonary hygiene  -Bronchodilators PRN -PAD protocol in place; wean sedation/analgesia for RASS goal 0  Sepsis 2/2 MSSA bacteremia in immunocompromised patient Possible CAP Lactic acidosis, resolved -Infectious Disease consulted (Dr. Candiss Norse), appreciate input -Continue Ancef, cefepime for now -Obtain TTE -Repeat blood cultures until clear; follow up trach aspirate -Monitor WBC/ fever curve -Strict I/O's  Shock Favor sedation/analgesia related hypotension rather than septic. -Pressors for MAP goal >65 -Continue steroids per home; no indication for stress dose steroids at this time -Wean pressors as able -Minimize sedation -Abx as above -Stop fluids  given gross anasarca  Acute toxic metabolic encephalopathy likely multifactorial in the setting of sepsis and possible seizure like activity -CT Head pending -Goal RASS 0 -Supportive care  Seizure Like Activity -CT Head wnl -Loaded with Keppra 1500 mg x 1 -Will stop Keppra; monitor for recurrence of seizure activity -Sedation as above weaning as able -Seizure precautions  H/o upper extremity DVT It appears that he was started on Xarelto before around Feb/Mar 2022 for provoked upper extremity DVT. He has completed an appropriate course of anticoagulation at this time and therapeutic anticoagulation will be discontinued. CTA chest did not reveal PE on admission. -Stop heparin gtt -Start SQH for VTE ppx  Electrolytes -Monitor I&O's / urinary output -Follow BMP -Replace electrolytes as indicated   DM2 -CBGs -Resistant Sliding scale insulin while on steroids  -Basal insulin for CBG goal <200 -Follow ICU hyper/hypoglycemia protocol   Best practice:  Diet:  Tube Feed  Pain/Anxiety/Delirium protocol (if indicated): Yes (RASS goal 0) VAP protocol (if indicated): Yes DVT prophylaxis: Subcutaneous Heparin GI prophylaxis: H2B Glucose control:  SSI Yes Central venous access:  N/A Arterial line:  N/A Foley:  Yes, and it is still needed Mobility:  bed rest  PT consulted: N/A Last date of multidisciplinary goals of care discussion [11/19] Code Status:  full code Disposition: ICU   = Goals of Care =   Code Status: Full Code   Primary Emergency Contact: [place], Home Phone: [phone] Wishes to pursue full aggressive treatment and intervention options, including CPR and intubation, but goals of care will be addressed on going with family if that should become necessary.  Critical care time: 39 minutes    Bennie Pierini, MD 07/09/21 9:01 AM

## 2021-07-09 NOTE — Consult Note (Signed)
Pharmacy Antibiotic Note  Craig [name] is a 30 y.o. male admitted on 07/08/2021 with bacteriemia.   Pharmacy has been consulted to transition pt from vancomycin and cefepime to cefazolin dosing.  Plan: Cefazolin 2 g IV q8h   Monitor clinical picture, renal function, and vancomycin levels at steady state F/U C&S, abx deescalation / LOT   Height: 6\' 1"  (185.4 cm) Weight: (!) 145 kg (319 lb 10.7 oz) IBW/kg (Calculated) : 79.9  Temp (24hrs), Avg:99.5 F (37.5 C), Min:98.3 F (36.8 C), Max:100.5 F (38.1 C)  Recent Labs  Lab 07/08/21 0821 07/08/21 1203 07/09/21 0129  WBC 9.8  --  9.9  CREATININE 0.50*  --  0.51*  LATICACIDVEN 3.5* 1.9  --      Estimated Creatinine Clearance: 198.6 mL/min (A) (by C-G formula based on SCr of 0.51 mg/dL (L)).    Allergies  Allergen Reactions   Amoxicillin Rash    Tolerated cefepime and cefazolin 08/2020.    Antimicrobials this admission: 11/19 vancomycin >>  11/19 cefepime >>   Dose adjustments this admission: N/A  Microbiology results: 11/19 BCx: 1 of 4, Staph Aureus, no resistance 11/19 respiratory panel: pending 11/19 trach aspirate: pending  11/19 MRSA PCR: negative  Thank you for allowing pharmacy to be a part of this patient's care.  Renda Rolls, PharmD, [facility] 07/09/2021 3:14 AM

## 2021-07-09 NOTE — Progress Notes (Signed)
NP notified that BP trending down, last BP was 88/49 (61). Acknowledged, see new orders.

## 2021-07-09 NOTE — Consult Note (Signed)
ANTICOAGULATION CONSULT NOTE - Follow Up Consult  Pharmacy Consult for heparin Indication: Hx of upper extremity DVT  Allergies  Allergen Reactions   Amoxicillin Rash    Tolerated cefepime and cefazolin 08/2020.    Patient Measurements: Height: 6\' 1"  (185.4 cm) Weight: (!) 145.6 kg (320 lb 15.8 oz) IBW/kg (Calculated) : 79.9 Heparin Dosing Weight: 108 kg  Vital Signs: Temp: 99.4 F (37.4 C) (11/20 0000) Temp Source: Axillary (11/20 0000) BP: 97/49 (11/20 0330) Pulse Rate: 92 (11/20 0330)  Labs: Recent Labs    07/08/21 0821 07/08/21 1448 07/08/21 1829 07/09/21 0129  HGB 9.9*  --   --  8.9*  HCT 33.6*  --   --  29.2*  PLT 201  --   --  178  APTT  --   --  32  --   LABPROT  --   --  13.6  --   INR  --   --  1.0  --   HEPARINUNFRC  --   --  <0.10*  --   CREATININE 0.50*  --   --  0.51*  TROPONINIHS  --  55* 89*  --      Estimated Creatinine Clearance: 199.1 mL/min (A) (by C-G formula based on SCr of 0.51 mg/dL (L)).   Medications:  Xarelto 20 mg daily in the morning, last dose 11/18 @ 0800  Assessment: 30 y.o. 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 Pharmacy has been consulted for heparin dosing.   Baseline labs: Hgb 9.9, Plts 201, aPTT 32, INR 1.0, heparin level <0.10  Goal of Therapy:  Heparin level 0.3-0.7 units/ml Monitor platelets by anticoagulation protocol: Yes  1120 0129 HL  < 0.1, subthera (Called lab for verbal)   Plan:  Give 3000 units bolus x 1 Increase infusion to 2100 units/hr Will recheck HL in 6 hr after rate change Continue to monitor H&H and platelets  Renda Rolls, PharmD, [facility] 07/09/2021 4:11 AM

## 2021-07-09 NOTE — Plan of Care (Signed)
#  MSSA bacteremia  -On Vancomycin -ID consult

## 2021-07-09 NOTE — Progress Notes (Signed)
Spoke with Aldine Contes and secure chat with Dr Jonnie Finner re positive blood cx.  PICC order to be cancelled.  Pt currently has 4 PIV accesses.

## 2021-07-09 NOTE — Progress Notes (Signed)
NP notified that troponin is trending up, current result was 89, previous troponin was 55. Acknowledged, no new orders at this time.

## 2021-07-09 NOTE — Consult Note (Signed)
Pharmacy Antibiotic Note  Craig [name] is a 30 y.o. male admitted on 07/08/2021 with bacteriemia.   Pharmacy has been consulted for both cefepime to cefazolin dosing.   Discussed with intensivist, since patient has MSSA bacteremia but also wanting to cover for CAP in immunosuppressed patient, he would like to use both cephalosporins currently.  Plan: Cefazolin 2 g IV q8h   Cefepime 2g IV q8h  Monitor clinical picture, renal function, and vancomycin levels at steady state F/U C&S, abx deescalation / LOT   Height: 6\' 1"  (185.4 cm) Weight: (!) 145.6 kg (320 lb 15.8 oz) IBW/kg (Calculated) : 79.9  Temp (24hrs), Avg:99.2 F (37.3 C), Min:98.8 F (37.1 C), Max:100.5 F (38.1 C)  Recent Labs  Lab 07/08/21 0821 07/08/21 1203 07/09/21 0129  WBC 9.8  --  9.9  CREATININE 0.50*  --  0.51*  LATICACIDVEN 3.5* 1.9  --      Estimated Creatinine Clearance: 199.1 mL/min (A) (by C-G formula based on SCr of 0.51 mg/dL (L)).    Allergies  Allergen Reactions   Amoxicillin Rash    Tolerated cefepime and cefazolin 08/2020.    Antimicrobials this admission: 11/19 vancomycin >> 11/20 11/19 cefepime >>  11/20 cefazolin >>  Dose adjustments this admission: N/A  Microbiology results: 11/19 BCx: 1 of 4, Staph Aureus, no resistance 11/19 respiratory panel: pending 11/19 trach aspirate: pending  11/19 MRSA PCR: negative  Thank you for allowing pharmacy to be a part of this patient's care.  Pearla Dubonnet, PharmD Clinical Pharmacist 07/09/2021 9:06 AM

## 2021-07-09 NOTE — Progress Notes (Addendum)
Initial Nutrition Assessment  DOCUMENTATION CODES:   Obesity unspecified  INTERVENTION:   -D/c Vital High Protein -Initiate Vital AF 1.2 @ 20 ml/hr via OGT and increase by 10 ml every 4 hours to goal rate of 65 ml/hr. (1560 ml daily)  90 ml Prostat TID.    30 ml free water flush every 4 hours  Tube feeding regimen provides 2112 kcal (100% of needs), 183 grams of protein, and 1265 ml of H2O. Total free water: 1445 ml daily  NUTRITION DIAGNOSIS:   Inadequate oral intake related to inability to eat as evidenced by NPO status.  GOAL:   Provide needs based on ASPEN/SCCM guidelines  MONITOR:   Labs, Weight trends, TF tolerance, Skin, I & O's  REASON FOR ASSESSMENT:   Consult, Ventilator Enteral/tube feeding initiation and management  ASSESSMENT:   30 y.o. male with significant PMH of extensive medical history including inflammatory arthritis on methotrexate and chronic prednisone at 10 mg daily, prior severe Covid-19 infection requiring prolonged hospitalization and tracheostomy (since decannulated) Dec 2021 - Feb 2022, chronic hypoxic and hypercarbic respiratory failure on 2L O2 PRN, MSSA bacteremia, MSSA pneumonia, perforated diverticulitis s/p partial colectomy with ostomy creation, morbid obesity and gout who presented to the ED with chief complaints of progressive shortness of breath.  Pt admitted with respiratory failure.   Patient is currently intubated on ventilator support. OGT placement verified by x-ray.  MV: 8.3 L/min Temp (24hrs), Avg:99.3 F (37.4 C), Min:98.8 F (37.1 C), Max:100.5 F (38.1 C)  Reviewed I/O's: +1.4 L x 24 hours   UOP: 2 L x 24 hours  MAP: 79   Medications reviewed and include colace, folic acid, miralax, prednisone, 0.9% sodium chloride infusion @ 10-20 ml/hr, precedex, fentanyl, and levophed.   Labs reviewed: CBGS: 273-286 (inpatient orders for glycemic control are 0-20 units insulin aspart every 4 hours and 15 units insulin  glargine-yfgn BID).    Diet Order:   Diet Order             Diet NPO time specified  Diet effective now                   EDUCATION NEEDS:   No education needs have been identified at this time  Skin:  Skin Assessment: Skin Integrity Issues: Skin Integrity Issues:: Stage II Stage II: coccyx  Last BM:  07/09/21  Height:   Ht Readings from Last 1 Encounters:  07/08/21 6\' 1"  (1.854 m)    Weight:   Wt Readings from Last 1 Encounters:  07/09/21 (!) 145.6 kg    Ideal Body Weight:  83.6 kg  BMI:  Body mass index is 42.35 kg/m.  Estimated Nutritional Needs:   Kcal:  2595-6387  Protein:  > 167 grams  Fluid:  > 1.8 L    Loistine Chance, RD, LDN, [place] Registered Dietitian II Certified Diabetes Care and Education Specialist Please refer to [facility] for RD and/or RD on-call/weekend/after hours pager

## 2021-07-10 ENCOUNTER — Inpatient Hospital Stay: Payer: Medicaid Other

## 2021-07-10 DIAGNOSIS — M009 Pyogenic arthritis, unspecified: Secondary | ICD-10-CM

## 2021-07-10 LAB — GLUCOSE, CAPILLARY
Glucose-Capillary: 208 mg/dL — ABNORMAL HIGH (ref 70–99)
Glucose-Capillary: 238 mg/dL — ABNORMAL HIGH (ref 70–99)
Glucose-Capillary: 242 mg/dL — ABNORMAL HIGH (ref 70–99)
Glucose-Capillary: 247 mg/dL — ABNORMAL HIGH (ref 70–99)
Glucose-Capillary: 262 mg/dL — ABNORMAL HIGH (ref 70–99)
Glucose-Capillary: 262 mg/dL — ABNORMAL HIGH (ref 70–99)
Glucose-Capillary: 294 mg/dL — ABNORMAL HIGH (ref 70–99)

## 2021-07-10 LAB — BASIC METABOLIC PANEL
Anion gap: 7 (ref 5–15)
BUN: 11 mg/dL (ref 6–20)
CO2: 40 mmol/L — ABNORMAL HIGH (ref 22–32)
Calcium: 8.6 mg/dL — ABNORMAL LOW (ref 8.9–10.3)
Chloride: 91 mmol/L — ABNORMAL LOW (ref 98–111)
Creatinine, Ser: 0.52 mg/dL — ABNORMAL LOW (ref 0.61–1.24)
GFR, Estimated: >60 mL/min (ref >60)
Glucose, Bld: 263 mg/dL — ABNORMAL HIGH (ref 70–99)
Potassium: 3.5 mmol/L (ref 3.5–5.1)
Sodium: 138 mmol/L (ref 135–145)

## 2021-07-10 LAB — CBC
HCT: 30 % — ABNORMAL LOW (ref 39.0–52.0)
Hemoglobin: 9 g/dL — ABNORMAL LOW (ref 13.0–17.0)
MCH: 27.9 pg (ref 26.0–34.0)
MCHC: 30 g/dL (ref 30.0–36.0)
MCV: 92.9 fL (ref 80.0–100.0)
Platelets: 194 10*3/uL (ref 150–400)
RBC: 3.23 MIL/uL — ABNORMAL LOW (ref 4.22–5.81)
RDW: 18.2 % — ABNORMAL HIGH (ref 11.5–15.5)
WBC: 8.2 10*3/uL (ref 4.0–10.5)
nRBC: 0 % (ref 0.0–0.2)

## 2021-07-10 LAB — PHOSPHORUS: Phosphorus: 3.5 mg/dL (ref 2.5–4.6)

## 2021-07-10 LAB — MAGNESIUM: Magnesium: 2.2 mg/dL (ref 1.7–2.4)

## 2021-07-10 MED ORDER — SODIUM CHLORIDE 0.9 % IV SOLN
2.0000 g | Freq: Once | INTRAVENOUS | Status: AC
  Administered 2021-07-10: 2 g via INTRAVENOUS
  Filled 2021-07-10: qty 2000

## 2021-07-10 MED ORDER — DIPHENHYDRAMINE HCL 50 MG/ML IJ SOLN
50.0000 mg | Freq: Once | INTRAMUSCULAR | Status: AC
  Administered 2021-07-10: 50 mg via INTRAVENOUS
  Filled 2021-07-10: qty 1

## 2021-07-10 MED ORDER — METOPROLOL TARTRATE 5 MG/5ML IV SOLN
5.0000 mg | Freq: Once | INTRAVENOUS | Status: AC
Start: 2021-07-10 — End: 2021-07-10

## 2021-07-10 MED ORDER — DULOXETINE HCL 60 MG PO CPEP
60.0000 mg | ORAL_CAPSULE | Freq: Every day | ORAL | Status: DC
  Administered 2021-07-10 – 2021-07-25 (×16): 60 mg via ORAL
  Filled 2021-07-10 (×3): qty 1
  Filled 2021-07-10: qty 2
  Filled 2021-07-10: qty 1
  Filled 2021-07-10: qty 2
  Filled 2021-07-10 (×3): qty 1
  Filled 2021-07-10: qty 2
  Filled 2021-07-10 (×3): qty 1
  Filled 2021-07-10 (×2): qty 2
  Filled 2021-07-10: qty 1

## 2021-07-10 MED ORDER — GADOBUTROL 1 MMOL/ML IV SOLN: 10.0000 mL | Freq: Once | INTRAVENOUS | Status: DC | PRN

## 2021-07-10 MED ORDER — METOPROLOL TARTRATE 5 MG/5ML IV SOLN
INTRAVENOUS | Status: AC
  Administered 2021-07-10: 5 mg via INTRAVENOUS
  Filled 2021-07-10: qty 5

## 2021-07-10 MED ORDER — KETOROLAC TROMETHAMINE 30 MG/ML IJ SOLN
30.0000 mg | Freq: Once | INTRAMUSCULAR | Status: AC
  Administered 2021-07-10: 30 mg via INTRAVENOUS
  Filled 2021-07-10: qty 1

## 2021-07-10 MED ORDER — SODIUM CHLORIDE 0.9 % IV SOLN
12.0000 g | INTRAVENOUS | Status: AC
  Administered 2021-07-10 – 2021-07-22 (×12): 12 g via INTRAVENOUS
  Filled 2021-07-10 (×5): qty 12000
  Filled 2021-07-10 (×2): qty 10000
  Filled 2021-07-10 (×3): qty 12000
  Filled 2021-07-10: qty 4000
  Filled 2021-07-10 (×3): qty 12000
  Filled 2021-07-10: qty 10000

## 2021-07-10 MED ORDER — NICOTINE 21 MG/24HR TD PT24
21.0000 mg | MEDICATED_PATCH | Freq: Every day | TRANSDERMAL | Status: DC
  Administered 2021-07-10 – 2021-07-23 (×14): 21 mg via TRANSDERMAL
  Filled 2021-07-10 (×16): qty 1

## 2021-07-10 MED ORDER — FUROSEMIDE 20 MG PO TABS
40.0000 mg | ORAL_TABLET | Freq: Every day | ORAL | Status: DC
  Administered 2021-07-10 – 2021-07-11 (×2): 40 mg via ORAL
  Filled 2021-07-10 (×2): qty 2

## 2021-07-10 MED ORDER — OXYCODONE HCL 5 MG PO TABS
5.0000 mg | ORAL_TABLET | Freq: Four times a day (QID) | ORAL | Status: DC | PRN
  Administered 2021-07-10 – 2021-07-13 (×3): 5 mg via ORAL
  Filled 2021-07-10 (×3): qty 1

## 2021-07-10 MED ORDER — LIDOCAINE 5 % EX PTCH
1.0000 | MEDICATED_PATCH | CUTANEOUS | Status: DC
  Administered 2021-07-10 – 2021-07-19 (×8): 1 via TRANSDERMAL
  Filled 2021-07-10 (×16): qty 1

## 2021-07-10 MED ORDER — TRAMADOL HCL 50 MG PO TABS
50.0000 mg | ORAL_TABLET | Freq: Two times a day (BID) | ORAL | Status: DC
  Administered 2021-07-10 (×2): 50 mg via ORAL
  Filled 2021-07-10 (×2): qty 1

## 2021-07-10 MED ORDER — CLONAZEPAM 1 MG PO TABS
1.0000 mg | ORAL_TABLET | Freq: Every day | ORAL | Status: DC
  Administered 2021-07-10 – 2021-07-14 (×4): 1 mg via ORAL
  Filled 2021-07-10 (×4): qty 1

## 2021-07-10 MED ORDER — PREGABALIN 75 MG PO CAPS
200.0000 mg | ORAL_CAPSULE | Freq: Two times a day (BID) | ORAL | Status: DC
  Administered 2021-07-10 – 2021-07-25 (×30): 200 mg via ORAL
  Filled 2021-07-10: qty 2
  Filled 2021-07-10: qty 1
  Filled 2021-07-10: qty 2
  Filled 2021-07-10: qty 1
  Filled 2021-07-10 (×3): qty 2
  Filled 2021-07-10 (×2): qty 1
  Filled 2021-07-10: qty 2
  Filled 2021-07-10 (×2): qty 1
  Filled 2021-07-10: qty 2
  Filled 2021-07-10: qty 1
  Filled 2021-07-10: qty 2
  Filled 2021-07-10 (×5): qty 1
  Filled 2021-07-10 (×4): qty 2
  Filled 2021-07-10: qty 1
  Filled 2021-07-10: qty 2
  Filled 2021-07-10: qty 1
  Filled 2021-07-10: qty 2
  Filled 2021-07-10: qty 1
  Filled 2021-07-10: qty 2

## 2021-07-10 MED ORDER — ADULT MULTIVITAMIN W/MINERALS CH
1.0000 | ORAL_TABLET | Freq: Every day | ORAL | Status: DC
  Administered 2021-07-12 – 2021-07-25 (×14): 1 via ORAL
  Filled 2021-07-10 (×14): qty 1

## 2021-07-10 MED ORDER — METOPROLOL SUCCINATE ER 50 MG PO TB24
100.0000 mg | ORAL_TABLET | Freq: Every day | ORAL | Status: DC
  Administered 2021-07-11 – 2021-07-25 (×15): 100 mg via ORAL
  Filled 2021-07-10 (×16): qty 2

## 2021-07-10 MED ORDER — NEPRO/CARBSTEADY PO LIQD
237.0000 mL | Freq: Three times a day (TID) | ORAL | Status: DC
  Administered 2021-07-10 – 2021-07-15 (×3): 237 mL via ORAL

## 2021-07-10 MED ORDER — ASCORBIC ACID 500 MG PO TABS
500.0000 mg | ORAL_TABLET | Freq: Two times a day (BID) | ORAL | Status: DC
  Administered 2021-07-10 – 2021-07-25 (×27): 500 mg via ORAL
  Filled 2021-07-10 (×27): qty 1

## 2021-07-10 NOTE — Consult Note (Signed)
Reason for Consult: Right knee pain possible septic joint chronic Referring Physician: Dr. Phillips Climes Craig [name] is an 30 y.o. male.  HPI: Patient has been having significant pain in the right knee since June of this year.  He had episode of COVID December 2021 and since then has been in and out of the hospital.  He had attempted aspiration of his knee last week in our office and was subsequently admitted with bacteremia.  No fluid could be withdrawn under ultrasound guidance from Dr. Candelaria Stagers in our office.  He now is having severe pain and cannot move his knee at all.  Prior x-rays have shown significant erosion of the articular cartilage since prior x-rays.  He had injected shin and the knee in June and is been minimally ambulatory since.  He is also required increased O2 since then.  Past Medical History:  Diagnosis Date   Gout    Hypertension    Rupture of bowel [facility])     Past Surgical History:  Procedure Laterality Date   COLONOSCOPY WITH PROPOFOL N/A 05/18/2020   Procedure: COLONOSCOPY WITH PROPOFOL;  Surgeon: Benjamine Sprague, DO;  Location: [facility] ENDOSCOPY;  Service: General;  Laterality: N/A;   COLOSTOMY N/A 11/22/2019   Procedure: COLOSTOMY;  Surgeon: Herbert Pun, MD;  Location: [facility] ORS;  Service: General;  Laterality: N/A;   LAPAROTOMY N/A 11/22/2019   Procedure: EXPLORATORY LAPAROTOMY;  Surgeon: Herbert Pun, MD;  Location: [facility] ORS;  Service: General;  Laterality: N/A;   LAPAROTOMY N/A 07/08/2020   Procedure: EXPLORATORY LAPAROTOMY;  Surgeon: Herbert Pun, MD;  Location: [facility] ORS;  Service: General;  Laterality: N/A;   PARTIAL COLECTOMY N/A 11/22/2019   Procedure: PARTIAL COLECTOMY;  Surgeon: Herbert Pun, MD;  Location: [facility] ORS;  Service: General;  Laterality: N/A;   PEG PLACEMENT N/A 09/14/2020   Procedure: PERCUTANEOUS ENDOSCOPIC GASTROSTOMY (PEG) PLACEMENT;  Surgeon: Benjamine Sprague, DO;  Location: [facility] ENDOSCOPY;  Service: General;   Laterality: N/A;  TRAVEL CASE TRACH [place] N/A 09/13/2020   Procedure: TRACHEOSTOMY;  Surgeon: Beverly Gust, MD;  Location: [facility] ORS;  Service: ENT;  Laterality: N/A;   XI ROBOTIC ASSISTED COLOSTOMY TAKEDOWN N/A 06/28/2020   Procedure: XI ROBOTIC ASSISTED COLOSTOMY TAKEDOWN CONVERTED TO OPEN PROCEDURE;  Surgeon: Herbert Pun, MD;  Location: [facility] ORS;  Service: General;  Laterality: N/A;    Family History  Problem Relation Age of Onset   Healthy Mother    Healthy Father     Social History:  reports that he has never smoked. His smokeless tobacco use includes chew. He reports current alcohol use. He reports that he does not use drugs.  Allergies:  Allergies  Allergen Reactions   Amoxicillin Rash    Tolerated cefepime and cefazolin 08/2020.    Medications: I have reviewed the patient's current medications.  Results for orders placed or performed during the hospital encounter of 07/08/21 (from the past 48 hour(s))  Glucose, capillary     Status: Abnormal   Collection Time: 07/08/21  4:18 PM  Result Value Ref Range   Glucose-Capillary 262 (H) 70 - 99 mg/dL    Comment: Glucose reference range applies only to samples taken after fasting for at least 8 hours.  MRSA Next Gen by PCR, Nasal     Status: None   Collection Time: 07/08/21  4:24 PM   Specimen: Nasal Mucosa; Nasal Swab  Result Value Ref Range   MRSA by PCR Next Gen NOT DETECTED NOT DETECTED  Comment: (NOTE) The GeneXpert MRSA Assay (FDA approved for NASAL specimens only), is one component of a comprehensive MRSA colonization surveillance program. It is not intended to diagnose MRSA infection nor to guide or monitor treatment for MRSA infections. Test performance is not FDA approved in patients less than 69 years old. Performed at [facility], [address] [place], [place] [address]   Blood gas, arterial     Status: Abnormal   Collection Time: 07/08/21  4:40  PM  Result Value Ref Range   FIO2 0.45    Delivery systems VENTILATOR    Mode PRESSURE REGULATED VOLUME CONTROL    VT 550 mL   LHR 18 resp/min   pH, Arterial 7.46 (H) 7.350 - 7.450   pCO2 arterial 67 (HH) 32.0 - 48.0 mmHg    Comment: CRITICAL RESULT CALLED TO, READ BACK BY AND VERIFIED WITH:   pO2, Arterial 99 83.0 - 108.0 mmHg   Bicarbonate 47.7 (H) 20.0 - 28.0 mmol/L   Acid-Base Excess 20.5 (H) 0.0 - 2.0 mmol/L   O2 Saturation 98.0 %   Patient temperature 37.0    Collection site LEFT RADIAL    Sample type ARTERIAL DRAW    Allens test (pass/fail) PASS PASS   Mechanical Rate 18     Comment: Performed at [facility], [address] [place], [place] [address]  Respiratory (~20 pathogens) panel by PCR     Status: None   Collection Time: 07/08/21  5:30 PM   Specimen: Nasopharyngeal Swab; Respiratory  Result Value Ref Range   Adenovirus NOT DETECTED NOT DETECTED   Coronavirus 229E NOT DETECTED NOT DETECTED    Comment: (NOTE) The Coronavirus on the Respiratory Panel, DOES NOT test for the novel  Coronavirus (2019 nCoV)    Coronavirus HKU1 NOT DETECTED NOT DETECTED   Coronavirus NL63 NOT DETECTED NOT DETECTED   Coronavirus OC43 NOT DETECTED NOT DETECTED   Metapneumovirus NOT DETECTED NOT DETECTED   Rhinovirus / Enterovirus NOT DETECTED NOT DETECTED   Influenza A NOT DETECTED NOT DETECTED   Influenza B NOT DETECTED NOT DETECTED   Parainfluenza Virus 1 NOT DETECTED NOT DETECTED   Parainfluenza Virus 2 NOT DETECTED NOT DETECTED   Parainfluenza Virus 3 NOT DETECTED NOT DETECTED   Parainfluenza Virus 4 NOT DETECTED NOT DETECTED   Respiratory Syncytial Virus NOT DETECTED NOT DETECTED   Bordetella pertussis NOT DETECTED NOT DETECTED   Bordetella Parapertussis NOT DETECTED NOT DETECTED   Chlamydophila pneumoniae NOT DETECTED NOT DETECTED   Mycoplasma pneumoniae NOT DETECTED NOT DETECTED    Comment: Performed at [facility], [place] [address] [place], [place] [address]   Magnesium     Status: Abnormal   Collection Time: 07/08/21  6:29 PM  Result Value Ref Range   Magnesium 1.5 (L) 1.7 - 2.4 mg/dL    Comment: Performed at [facility], [address] [place], [place] [address]  Phosphorus     Status: Abnormal   Collection Time: 07/08/21  6:29 PM  Result Value Ref Range   Phosphorus 1.2 (L) 2.5 - 4.6 mg/dL    Comment: Performed at [facility], [address] [place], [place] [address]  HIV Antibody (routine testing w rflx)     Status: None   Collection Time: 07/08/21  6:29 PM  Result Value Ref Range   HIV Screen 4th Generation wRfx Non Reactive Non Reactive    Comment: Performed at [facility], [place] [address] [place], [place] [address]  Troponin I (High Sensitivity)     Status: Abnormal   Collection Time: 07/08/21  6:29 PM  Result Value Ref Range   Troponin I (High Sensitivity) 89 (H) <18 ng/L    Comment: READ BACK AND VERIFIED WITH MELISSA COBB RN AT 1915 07/08/21 GAA (NOTE) Elevated high sensitivity troponin I (hsTnI) values and significant  changes across serial measurements may suggest ACS but many other  chronic and acute conditions are known to elevate hsTnI results.  Refer to the "Links" section for chest pain algorithms and additional  guidance. Performed at [facility], [address] [place], [place] [address]   Hemoglobin A1c     Status: Abnormal   Collection Time: 07/08/21  6:29 PM  Result Value Ref Range   Hgb A1c MFr Bld 6.8 (H) 4.8 - 5.6 %    Comment: (NOTE) Pre diabetes:          5.7%-6.4%  Diabetes:              >6.4%  Glycemic control for   <7.0% adults with diabetes    Mean Plasma Glucose 148.46 mg/dL    Comment: Performed at [facility] [address], [place], [place] [address]  APTT     Status: None   Collection Time: 07/08/21  6:29 PM  Result Value Ref Range   aPTT 32 24 - 36 seconds    Comment: Performed at [facility], [address]  [place], [place] [address]  Protime-INR     Status: None   Collection Time: 07/08/21  6:29 PM  Result Value Ref Range   Prothrombin Time 13.6 11.4 - 15.2 seconds   INR 1.0 0.8 - 1.2    Comment: (NOTE) INR goal varies based on device and disease states. Performed at [facility], [place], [place] [address]   Heparin level (unfractionated)     Status: Abnormal   Collection Time: 07/08/21  6:29 PM  Result Value Ref Range   Heparin Unfractionated <0.10 (L) 0.30 - 0.70 IU/mL    Comment: (NOTE) The clinical reportable range upper limit is being lowered to >1.10 to align with the FDA approved guidance for the current laboratory assay.  If heparin results are below expected values, and patient dosage has  been confirmed, suggest follow up testing of antithrombin III levels. Performed at [facility], [address] [place], [place] [address] CORRECTED ON 11/19 AT 1942: PREVIOUSLY REPORTED AS 0.01   Protein, urine, random     Status: None   Collection Time: 07/08/21  6:37 PM  Result Value Ref Range   Total Protein, Urine 32 mg/dL    Comment: NO NORMAL RANGE ESTABLISHED FOR THIS TEST Performed at [facility], [address] [place], [place] [address]   Glucose, capillary     Status: Abnormal   Collection Time: 07/08/21  7:37 PM  Result Value Ref Range   Glucose-Capillary 215 (H) 70 - 99 mg/dL    Comment: Glucose reference range applies only to samples taken after fasting for at least 8 hours.   Comment 1 Notify RN    Comment 2 Document in Chart   Glucose, capillary     Status: Abnormal   Collection Time: 07/08/21 11:39 PM  Result Value Ref Range   Glucose-Capillary 231 (H) 70 - 99 mg/dL    Comment: Glucose reference range applies only to samples taken after fasting for at least 8 hours.  CBC  Status: Abnormal   Collection Time: 07/09/21  1:29 AM  Result Value Ref Range   WBC 9.9 4.0 - 10.5 K/uL   RBC 3.09 (L) 4.22 - 5.81 MIL/uL    Hemoglobin 8.9 (L) 13.0 - 17.0 g/dL   HCT 29.2 (L) 39.0 - 52.0 %   MCV 94.5 80.0 - 100.0 fL   MCH 28.8 26.0 - 34.0 pg   MCHC 30.5 30.0 - 36.0 g/dL   RDW 18.2 (H) 11.5 - 15.5 %   Platelets 178 150 - 400 K/uL   nRBC 0.2 0.0 - 0.2 %    Comment: Performed at [facility], [address] [place], [place] [address]  Basic metabolic panel     Status: Abnormal   Collection Time: 07/09/21  1:29 AM  Result Value Ref Range   Sodium 134 (L) 135 - 145 mmol/L   Potassium 4.2 3.5 - 5.1 mmol/L   Chloride 86 (L) 98 - 111 mmol/L   CO2 37 (H) 22 - 32 mmol/L   Glucose, Bld 294 (H) 70 - 99 mg/dL    Comment: Glucose reference range applies only to samples taken after fasting for at least 8 hours.   BUN <5 (L) 6 - 20 mg/dL   Creatinine, Ser 0.51 (L) 0.61 - 1.24 mg/dL   Calcium 8.2 (L) 8.9 - 10.3 mg/dL   GFR, Estimated >60 >60 mL/min    Comment: (NOTE) Calculated using the CKD-EPI Creatinine Equation (2021)    Anion gap 11 5 - 15    Comment: Performed at [facility], [address], [place], [place] [address]  Magnesium     Status: None   Collection Time: 07/09/21  1:29 AM  Result Value Ref Range   Magnesium 2.4 1.7 - 2.4 mg/dL    Comment: Performed at [facility], [address] [place], [place] [address]  Phosphorus     Status: None   Collection Time: 07/09/21  1:29 AM  Result Value Ref Range   Phosphorus 2.8 2.5 - 4.6 mg/dL    Comment: Performed at [facility], [address] [place], [place] [address]  Procalcitonin     Status: None   Collection Time: 07/09/21  1:29 AM  Result Value Ref Range   Procalcitonin 1.18 ng/mL    Comment:        Interpretation: PCT > 0.5 ng/mL and <= 2 ng/mL: Systemic infection (sepsis) is possible, but other conditions are known to elevate PCT as well. (NOTE)       Sepsis PCT Algorithm           Lower Respiratory Tract                                      Infection PCT Algorithm    ----------------------------      ----------------------------         PCT < 0.25 ng/mL                PCT < 0.10 ng/mL          Strongly encourage             Strongly discourage   discontinuation of antibiotics    initiation of antibiotics    ----------------------------     -----------------------------       PCT 0.25 - 0.50 ng/mL            PCT 0.10 -  0.25 ng/mL               OR       >80% decrease in PCT            Discourage initiation of                                            antibiotics      Encourage discontinuation           of antibiotics    ----------------------------     -----------------------------         PCT >= 0.50 ng/mL              PCT 0.26 - 0.50 ng/mL                AND       <80% decrease in PCT             Encourage initiation of                                             antibiotics       Encourage continuation           of antibiotics    ----------------------------     -----------------------------        PCT >= 0.50 ng/mL                  PCT > 0.50 ng/mL               AND         increase in PCT                  Strongly encourage                                      initiation of antibiotics    Strongly encourage escalation           of antibiotics                                     -----------------------------                                           PCT <= 0.25 ng/mL                                                 OR                                        > 80% decrease in PCT  Discontinue / Do not initiate                                             antibiotics  Performed at [facility], [place], [place] [address]   Heparin level (unfractionated)     Status: Abnormal   Collection Time: 07/09/21  1:29 AM  Result Value Ref Range   Heparin Unfractionated <0.10 (L) 0.30 - 0.70 IU/mL    Comment: (NOTE) The clinical reportable range upper limit is being lowered to >1.10 to align with the FDA approved guidance  for the current laboratory assay.  If heparin results are below expected values, and patient dosage has  been confirmed, suggest follow up testing of antithrombin III levels. Performed at [facility], [address] [place], [place] [address]   Glucose, capillary     Status: Abnormal   Collection Time: 07/09/21  3:16 AM  Result Value Ref Range   Glucose-Capillary 282 (H) 70 - 99 mg/dL    Comment: Glucose reference range applies only to samples taken after fasting for at least 8 hours.  Blood gas, arterial     Status: Abnormal   Collection Time: 07/09/21  4:52 AM  Result Value Ref Range   FIO2 35.00    Mode PRESSURE REGULATED VOLUME CONTROL    VT 500 mL   Peep/cpap 8.0 cm H20   pH, Arterial 7.48 (H) 7.350 - 7.450   pCO2 arterial 57 (H) 32.0 - 48.0 mmHg   pO2, Arterial 93 83.0 - 108.0 mmHg   Bicarbonate 42.4 (H) 20.0 - 28.0 mmol/L   Acid-Base Excess 16.5 (H) 0.0 - 2.0 mmol/L   O2 Saturation 97.7 %   Patient temperature 37.0    Collection site RIGHT RADIAL    Sample type ARTERIAL DRAW    Allens test (pass/fail) PASS PASS   Mechanical Rate 18     Comment: Performed at [facility], [address] [place], [place] [address]  Triglycerides     Status: Abnormal   Collection Time: 07/09/21  5:03 AM  Result Value Ref Range   Triglycerides 199 (H) <150 mg/dL    Comment: Performed at [facility], [address] [place],  [address]  Glucose, capillary     Status: Abnormal   Collection Time: 07/09/21  7:24 AM  Result Value Ref Range   Glucose-Capillary 286 (H) 70 - 99 mg/dL    Comment: Glucose reference range applies only to samples taken after fasting for at least 8 hours.  Glucose, capillary     Status: Abnormal   Collection Time: 07/09/21 11:45 AM  Result Value Ref Range   Glucose-Capillary 273 (H) 70 - 99 mg/dL    Comment: Glucose reference range applies only to samples taken after fasting for at least 8 hours.  CULTURE, BLOOD (ROUTINE X  2) w Reflex to ID Panel     Status: None (Preliminary result)   Collection Time: 07/09/21  2:39 PM   Specimen: BLOOD  Result Value Ref Range   Specimen Description BLOOD THUMB R    Special Requests      BOTTLES DRAWN AEROBIC AND ANAEROBIC Blood Culture results may not be optimal due to an inadequate volume of blood received in culture bottles   Culture      NO GROWTH < 24 HOURS Performed at [facility], [address] [place], [place] [address]    Report Status PENDING   CULTURE, BLOOD (ROUTINE X 2) w Reflex to ID Panel     Status: None (Preliminary result)   Collection Time: 07/09/21  2:50 PM   Specimen: BLOOD RIGHT HAND  Result Value Ref Range   Specimen Description BLOOD RIGHT HAND    Special Requests      BOTTLES DRAWN AEROBIC AND ANAEROBIC Blood Culture adequate volume   Culture      NO GROWTH < 24 HOURS Performed at [facility], [address] [place], [place] [address]    Report Status PENDING   Glucose, capillary     Status: Abnormal   Collection Time: 07/09/21  3:48 PM  Result Value Ref Range   Glucose-Capillary 251 (H) 70 - 99 mg/dL    Comment: Glucose reference range applies only to samples taken after fasting for at least 8 hours.  Glucose, capillary     Status: Abnormal   Collection Time: 07/09/21  7:29 PM  Result Value Ref Range   Glucose-Capillary 230 (H) 70 - 99 mg/dL    Comment: Glucose reference range applies only to samples taken after fasting for at least 8 hours.   Comment 1 Notify RN    Comment 2 Document in Chart   Glucose, capillary     Status: Abnormal   Collection Time: 07/09/21 11:15 PM  Result Value Ref Range   Glucose-Capillary 242 (H) 70 - 99 mg/dL    Comment: Glucose reference range applies only to samples taken after fasting for at least 8 hours.   Comment 1 Notify RN    Comment 2 Document in Chart   Glucose, capillary     Status: Abnormal   Collection Time: 07/10/21  3:41 AM  Result Value Ref Range    Glucose-Capillary 262 (H) 70 - 99 mg/dL    Comment: Glucose reference range applies only to samples taken after fasting for at least 8 hours.  CBC     Status: Abnormal   Collection Time: 07/10/21  5:28 AM  Result Value Ref Range   WBC 8.2 4.0 - 10.5 K/uL   RBC 3.23 (L) 4.22 - 5.81 MIL/uL   Hemoglobin 9.0 (L) 13.0 - 17.0 g/dL   HCT 30.0 (L) 39.0 - 52.0 %   MCV 92.9 80.0 - 100.0 fL   MCH 27.9 26.0 - 34.0 pg   MCHC 30.0 30.0 - 36.0 g/dL   RDW 18.2 (H) 11.5 - 15.5 %   Platelets 194 150 - 400 K/uL   nRBC 0.0 0.0 - 0.2 %    Comment: Performed at [facility], [address] [place], [place] [address]  Basic metabolic panel     Status: Abnormal   Collection Time: 07/10/21  5:28 AM  Result Value Ref Range   Sodium 138 135 - 145 mmol/L   Potassium 3.5 3.5 - 5.1 mmol/L   Chloride 91 (L) 98 - 111 mmol/L   CO2 40 (H) 22 - 32 mmol/L   Glucose, Bld 263 (H) 70 - 99 mg/dL    Comment: Glucose reference range applies only to samples taken after fasting for at least 8 hours.   BUN 11 6 - 20 mg/dL   Creatinine, Ser 0.52 (L) 0.61 - 1.24 mg/dL   Calcium 8.6 (L) 8.9 - 10.3 mg/dL   GFR, Estimated >60 >60 mL/min    Comment: (NOTE) Calculated using the CKD-EPI Creatinine Equation (2021)    Anion gap 7 5 -  15    Comment: Performed at [facility], [address] [place], [place] [address]  Magnesium     Status: None   Collection Time: 07/10/21  5:28 AM  Result Value Ref Range   Magnesium 2.2 1.7 - 2.4 mg/dL    Comment: Performed at [facility], [address] [place], [place] [address]  Phosphorus     Status: None   Collection Time: 07/10/21  5:28 AM  Result Value Ref Range   Phosphorus 3.5 2.5 - 4.6 mg/dL    Comment: Performed at [facility], [address] [place], [place] [address]  Glucose, capillary     Status: Abnormal   Collection Time: 07/10/21  7:35 AM  Result Value Ref Range   Glucose-Capillary 238 (H) 70 - 99 mg/dL    Comment: Glucose  reference range applies only to samples taken after fasting for at least 8 hours.  Glucose, capillary     Status: Abnormal   Collection Time: 07/10/21 11:24 AM  Result Value Ref Range   Glucose-Capillary 208 (H) 70 - 99 mg/dL    Comment: Glucose reference range applies only to samples taken after fasting for at least 8 hours.    CT HEAD WO CONTRAST (5MM)  Result Date: 07/08/2021 CLINICAL DATA:  Seizure, abnormal neuro exam EXAM: CT HEAD WITHOUT CONTRAST TECHNIQUE: Contiguous axial images were obtained from the base of the skull through the vertex without intravenous contrast. COMPARISON:  CT head 10/28/2020, brain MRI 08/25/2020 FINDINGS: Brain: There is no acute intracranial hemorrhage, extra-axial fluid collection, or acute infarct. Parenchymal volume is normal. The ventricles are normal in size. There is no mass lesion. There is no midline shift. Vascular: No hyperdense vessel or unexpected calcification. Skull: Normal. Negative for fracture or focal lesion. Sinuses/Orbits: There is mild mucosal thickening in the paranasal sinuses. There is unchanged bilateral proptosis. Other: Endotracheal and enteric catheters are noted. IMPRESSION: No acute intracranial pathology or epileptogenic focus identified. Electronically Signed   By: Valetta Mole M.D.   On: 07/08/2021 17:22   ECHOCARDIOGRAM COMPLETE BUBBLE STUDY  Result Date: 07/09/2021    ECHOCARDIOGRAM REPORT   Patient Name:   Craig [name] Date of Exam: 07/09/2021 Medical Rec #:  [id number]             Height:       73.0 in Accession #:    [id number]            Weight:       321.0 lb Date of Birth:  October 04, 1990              BSA:          2.631 m Patient Age:    74 years              BP:           117/59 mmHg Patient Gender: M                     HR:           82 bpm. Exam Location:  [facility] Procedure: 2D Echo, Intracardiac Opacification Agent and Saline Contrast Bubble            Study Indications:     Bacteremia 790.7  History:         Patient has  no prior history of Echocardiogram examinations.  Sonographer:     Kathlen Brunswick RDCS Referring Phys:  [id number] ADAM ROSS [place] Diagnosing Phys:  Ida Rogue MD  Sonographer Comments: Suboptimal apical window, Technically challenging study due to limited acoustic windows, patient is morbidly obese, echo performed with patient supine and on artificial respirator and suboptimal parasternal window. Image acquisition  challenging due to patient body habitus and Image acquisition challenging due to respiratory motion. IMPRESSIONS  1. No valve vegetation noted.  2. Left ventricular ejection fraction, by estimation, is 50 to 55%. The left ventricle has low normal function. The left ventricle has no regional wall motion abnormalities. Left ventricular diastolic parameters were normal.  3. Right ventricular systolic function is mildly reduced. The right ventricular size is mildly enlarged. Tricuspid regurgitation signal is inadequate for assessing PA pressure.  4. The mitral valve is normal in structure. No evidence of mitral valve regurgitation. No evidence of mitral stenosis.  5. The aortic valve was not well visualized. Aortic valve regurgitation is not visualized. No aortic stenosis is present. FINDINGS  Left Ventricle: Left ventricular ejection fraction, by estimation, is 50 to 55%. The left ventricle has low normal function. The left ventricle has no regional wall motion abnormalities. Definity contrast agent was given IV to delineate the left ventricular endocardial borders. The left ventricular internal cavity size was normal in size. There is no left ventricular hypertrophy. Left ventricular diastolic parameters were normal. Right Ventricle: The right ventricular size is mildly enlarged. No increase in right ventricular wall thickness. Right ventricular systolic function is mildly reduced. Tricuspid regurgitation signal is inadequate for assessing PA pressure. Left Atrium: Left atrial size was normal in size.  Right Atrium: Right atrial size was normal in size. Pericardium: There is no evidence of pericardial effusion. Mitral Valve: The mitral valve is normal in structure. No evidence of mitral valve regurgitation. No evidence of mitral valve stenosis. Tricuspid Valve: The tricuspid valve is normal in structure. Tricuspid valve regurgitation is not demonstrated. No evidence of tricuspid stenosis. Aortic Valve: The aortic valve was not well visualized. Aortic valve regurgitation is not visualized. No aortic stenosis is present. Aortic valve peak gradient measures 4.9 mmHg. Pulmonic Valve: The pulmonic valve was normal in structure. Pulmonic valve regurgitation is not visualized. No evidence of pulmonic stenosis. Aorta: The aortic root is normal in size and structure. Venous: The pulmonary veins were not well visualized. The inferior vena cava was not well visualized. IAS/Shunts: No atrial level shunt detected by color flow Doppler. Agitated saline contrast was given intravenously to evaluate for intracardiac shunting.  LEFT VENTRICLE PLAX 2D LVIDd:         4.20 cm   Diastology LVIDs:         2.90 cm   LV e' medial:    11.00 cm/s LV PW:         1.20 cm   LV E/e' medial:  7.9 LV IVS:        1.30 cm   LV e' lateral:   8.38 cm/s LVOT diam:     2.20 cm   LV E/e' lateral: 10.4 LV SV:         46 LV SV Index:   17 LVOT Area:     3.80 cm  RIGHT VENTRICLE RV Basal diam:  3.50 cm RV S prime:     18.60 cm/s TAPSE (M-mode): 1.9 cm LEFT ATRIUM         Index       RIGHT ATRIUM           Index LA diam:    3.20 cm 1.22 cm/m  RA Area:  13.70 cm                                 RA Volume:   35.40 ml  13.46 ml/m  AORTIC VALVE                 PULMONIC VALVE AV Area (Vmax): 2.66 cm     PV Vmax:       0.94 m/s AV Vmax:        111.00 cm/s  PV Peak grad:  3.5 mmHg AV Peak Grad:   4.9 mmHg LVOT Vmax:      77.70 cm/s LVOT Vmean:     55.700 cm/s LVOT VTI:       0.120 m  AORTA Ao Root diam: 3.20 cm MITRAL VALVE MV Area (PHT): 3.58 cm    SHUNTS  MV Decel Time: 212 msec    Systemic VTI:  0.12 m MV E velocity: 87.40 cm/s  Systemic Diam: 2.20 cm MV A velocity: 46.70 cm/s MV E/A ratio:  1.87 Ida Rogue MD Electronically signed by Ida Rogue MD Signature Date/Time: 07/09/2021/12:56:41 PM    Final    [place] EKG SITE RITE  Result Date: 07/08/2021 If Site Rite image not attached, placement could not be confirmed due to current cardiac rhythm.   Review of Systems Blood pressure 126/63, pulse 67, temperature 99.9 F (37.7 C), resp. rate (!) 21, height 6\' 1"  (1.854 m), weight (!) 145.6 kg, SpO2 100 %. Physical Exam Patient has mild synovitis to the right knee he holds the knee in 30 degrees flexion and has severe pain with any attempted motion.  Prior aspiration site superior lateral is healed. 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  MRI pending.  He is tentatively put on the schedule for arthroscopic irrigation and multiple cultures synovial biopsy tomorrow under local with sedation  Edson Deridder 07/10/2021, 3:08 PM

## 2021-07-10 NOTE — Progress Notes (Signed)
Inpatient Diabetes Program Recommendations  AACE/ADA: New Consensus Statement on Inpatient Glycemic Control  Target Ranges:  Prepandial:   less than 140 mg/dL      Peak postprandial:   less than 180 mg/dL (1-2 hours)      Critically ill patients:  140 - 180 mg/dL    Latest Reference Range & Units 08/10/20 04:33 07/08/21 18:29  Hemoglobin A1C 4.8 - 5.6 % 5.7 (H) 6.8 (H)  (H): Data is abnormally high  Latest Reference Range & Units 07/09/21 07:24 07/09/21 11:45 07/09/21 15:48 07/09/21 19:29 07/09/21 23:15 07/10/21 03:41 07/10/21 07:35  Glucose-Capillary 70 - 99 mg/dL 286 (H) 273 (H) 251 (H) 230 (H) 242 (H) 262 (H) 238 (H)  (H): Data is abnormally high  Review of Glycemic Control  Diabetes history: No Outpatient Diabetes medications: NA Current orders for Inpatient glycemic control: Semglee 15 units BID, Novolog 0-20 units Q4H; Prednisone 10 mg daily  Inpatient Diabetes Program Recommendations:    HbgA1C:  A1C 6.8% on 07/08/21 indicating an average glucose of 148 mg/dl over the past 2-3 months. Per chart review, patient takes Prednisone chronically and was recently prescribed a Medrol dose pack on 06/07/21. If patient will be newly dx with DM this admission, please inform patient and nursing staff so patient can be educated; also consult diabetes coordinator.  NOTE: Patient admitted with respiratory failure, sepsis, pneumonia, and possible seizure. Per chart, patient does not have a hx of DM and takes steroids chronically. Patient was on the ventilator and tube feedings but noted patient was extubated today. Also noted patient was ordered Solumedrol which was switched to Prednisone today.   Thanks, Barnie Alderman, RN, MSN, CDE Diabetes Coordinator Inpatient Diabetes Program [phone] (Team Pager from 8am to 5pm)
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
## 2021-07-10 NOTE — Consult Note (Signed)
NAME: Craig [name]  DOB: 01/22/1991  MRN: [id number]  Date/Time: 07/10/2021 11:01 AM  REQUESTING PROVIDER: Deniece Portela Subjective:  REASON FOR CONSULT: MSSA bacteremia ?No history available from patient- chart reviewed Craig [name] is a 30 y.o. male was admitted for shortness of breath. Patient has a complicated medical history.  He was initially admitted on 11/22/2019 with diverticulitis/colon perforation and underwent emergent partial colectomy with end colostomy.  During that hospitalization he needed Solu-Medrol for an acute gout attack.  He was discharged on 12/01/2019.  He was readmitted in November 2021 underwent colostomy takedown robotic lap was attempted but was converted to laparotomy.,  Small bowel resection and parastomal hernia repair on 06/28/2020.  He was discharged on 07/03/2020.  He was readmitted on 07/08/2020 with sepsis and CT showed pneumoperitoneum.  He underwent laparotomy and partial colectomy with colostomy on 07/08/2020 and was discharged on 07/14/2020.  He presented to the ED on 08/09/2020 after testing positive for COVID with fever and hypotension.  He was started on remdesivir Decadron azithromycin and heated high flow nasal oxygen.  He was intubated and transferred to ICU.  On 09/06/2020 he had a fever and his blood and respiratory culture was positive for staph aureus.  He was placed on cefazolin.  During that hospitalization TEE could not be done because of tracheostomy.  He also had PE and MI during that hospitalization.  He was transferred to [place] on 09/24/2020 with tracheostomy, PEG.  It looks like patient was discharged sometime in February 2022 home.  He had a telemedicine appointment with his PCP on 10/20/2020 where his main complaint was neuropathy pain in his arms and his legs. He also was referred to a neurologist at [facility] clinic and underwent EMG and NCV on 02/01/2021.  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  He saw a rheumatologist on 02/06/2021. For right knee pain.  He has a history of gout in the right knee was injected with bupivacaine and Kenalog.  He was placed on oral prednisone. Other abnormalities were B12 deficiency, vitamin D deficiency, IgM monoclonal protein deviation with lambda light chain, elevated sed rate 51 and CRP and low titer anti-CCP antibodies but other serologies were negative. He last saw Dr. Jefm Bryant on 07/04/2019 was investigating for right septic arthritis.  He had an x-ray and that apparently indicated bone-on-bone changes and he was referred to Dr. Candelaria Stagers for knee aspiration but that was just not possible as it was a dry tap.  As the blood culture is positive for staph aureus I am seeing this patient.  Past Surgical History:  Procedure Laterality Date   COLONOSCOPY WITH PROPOFOL N/A 05/18/2020   Procedure: COLONOSCOPY WITH PROPOFOL;  Surgeon: Benjamine Sprague, DO;  Location: [facility] ENDOSCOPY;  Service: General;  Laterality: N/A;   COLOSTOMY N/A 11/22/2019   Procedure: COLOSTOMY;  Surgeon: Herbert Pun, MD;  Location: [facility] ORS;  Service: General;  Laterality: N/A;   LAPAROTOMY N/A 11/22/2019   Procedure: EXPLORATORY LAPAROTOMY;  Surgeon: Herbert Pun, MD;  Location: [facility] ORS;  Service: General;  Laterality: N/A;   LAPAROTOMY N/A 07/08/2020   Procedure: EXPLORATORY LAPAROTOMY;  Surgeon: Herbert Pun, MD;  Location: [facility] ORS;  Service: General;  Laterality: N/A;   PARTIAL COLECTOMY N/A 11/22/2019   Procedure: PARTIAL COLECTOMY;  Surgeon: Herbert Pun, MD;  Location: [facility] ORS;  Service: General;  Laterality: N/A;   PEG PLACEMENT N/A 09/14/2020   Procedure: PERCUTANEOUS ENDOSCOPIC GASTROSTOMY (PEG) PLACEMENT;  Surgeon: Benjamine Sprague, DO;  Location: [facility]  ENDOSCOPY;  Service: General;  Laterality: N/A;  TRAVEL CASE TRACH COVID POSITIVE   TRACHEOSTOMY TUBE PLACEMENT N/A 09/13/2020   Procedure: TRACHEOSTOMY;  Surgeon: Beverly Gust, MD;  Location: [facility] ORS;  Service: ENT;  Laterality: N/A;   XI ROBOTIC ASSISTED COLOSTOMY TAKEDOWN N/A 06/28/2020   Procedure: XI ROBOTIC ASSISTED COLOSTOMY TAKEDOWN CONVERTED TO OPEN PROCEDURE;  Surgeon: Herbert Pun, MD;  Location: [facility] ORS;  Service: General;  Laterality: N/A;    Social History   Socioeconomic History   Marital status: Married    Spouse name: Not on file   Number of children: Not on file   Years of education: Not on file   Highest education level: Not on file  Occupational History   Not on file  Tobacco Use   Smoking status: Never   Smokeless tobacco: Current    Types: Chew   Tobacco comments:    Not ready to quit.   Vaping Use   Vaping Use: Never used  Substance and Sexual Activity   Alcohol use: Yes    Comment: pint to a fifth per day per patient . Patient reports he hasn't had anything to drink in three weeks.   Drug use: Never   Sexual activity: Yes    Birth control/protection: None  Other Topics Concern   Not on file  Social History Narrative   Not on file   Social Determinants of Health   Financial Resource Strain: Not on file  Food Insecurity: Not on file  Transportation Needs: Not on file  Physical Activity: Not on file  Stress: Not on file  Social Connections: Not on file  Intimate Partner Violence: Not on file    Family History  Problem Relation Age of Onset   Healthy Mother    Healthy Father    Allergies  Allergen Reactions   Amoxicillin Rash    Tolerated cefepime and cefazolin 08/2020.   I? Current Facility-Administered Medications  Medication Dose Route Frequency Provider Last Rate Last Admin   0.9 %  sodium chloride infusion   Intravenous PRN Rust-Chester, Britton L, NP 5 mL/hr at 07/10/21 0700 Infusion Verify at 07/10/21 0700   0.9 %  sodium chloride infusion  250 mL Intravenous Continuous Rust-Chester, Huel Cote, NP   Stopped at 07/10/21 0618   ceFAZolin (ANCEF) IVPB 2g/100 mL premix  2 g Intravenous Q8H  Renda Rolls, RPH   Stopped at 07/10/21 4481   chlorhexidine gluconate (MEDLINE KIT) (PERIDEX) 0.12 % solution 15 mL  15 mL Mouth Rinse BID Lang Snow, NP   15 mL at 07/10/21 0843   Chlorhexidine Gluconate Cloth 2 % PADS 6 each  6 each Topical Q0600 Lang Snow, NP   6 each at 07/10/21 0549   clonazePAM (KLONOPIN) tablet 1 mg  1 mg Oral Daily Darel Hong D, NP       COVID-19 mRNA bivalent vaccine (Moderna) injection 0.5 mL  0.5 mL Intramuscular Once Bennie Pierini, MD       dexmedetomidine (PRECEDEX) 400 MCG/100ML (4 mcg/mL) infusion  0-1.2 mcg/kg/hr Intravenous Titrated Bennie Pierini, MD 36.4 mL/hr at 07/10/21 0700 1 mcg/kg/hr at 07/10/21 0700   docusate (COLACE) 50 MG/5ML liquid 100 mg  100 mg Per Tube BID Lang Snow, NP   100 mg at 07/09/21 2121   docusate sodium (COLACE) capsule 100 mg  100 mg Oral BID PRN Lang Snow, NP       DULoxetine (CYMBALTA) DR capsule 60 mg  60 mg  Oral Daily Ottie Glazier, MD       famotidine (PEPCID) IVPB 20 mg premix  20 mg Intravenous Q12H Lang Snow, NP   Stopped at 07/09/21 2153   feeding supplement (VITAL AF 1.2 CAL) liquid 1,000 mL  1,000 mL Per Tube Continuous Bennie Pierini, MD   Stopped at 07/10/21 0700   fentaNYL (SUBLIMAZE) bolus via infusion 50-100 mcg  50-100 mcg Intravenous Q1H PRN Lang Snow, NP   100 mcg at 08/67/61 9509   folic acid (FOLVITE) tablet 1 mg  1 mg Per Tube Daily Pearla Dubonnet, RPH       free water 30 mL  30 mL Per Tube Q4H Bennie Pierini, MD   30 mL at 07/10/21 0338   furosemide (LASIX) tablet 40 mg  40 mg Oral Daily Ottie Glazier, MD       heparin injection 5,000 Units  5,000 Units Subcutaneous Q8H Bennie Pierini, MD   5,000 Units at 07/10/21 0555   insulin aspart (novoLOG) injection 0-20 Units  0-20 Units Subcutaneous Q4H Lang Snow, NP   7 Units at 07/10/21 0848   insulin glargine-yfgn (SEMGLEE) injection 15 Units   15 Units Subcutaneous BID Bennie Pierini, MD   15 Units at 07/09/21 2121   levalbuterol (XOPENEX) nebulizer solution 0.63 mg  0.63 mg Nebulization Q6H PRN Bennie Pierini, MD       lidocaine (LIDODERM) 5 % 1 patch  1 patch Transdermal Q24H Darel Hong D, NP       MEDLINE mouth rinse  15 mL Mouth Rinse 10 times per day Lang Snow, NP   15 mL at 07/10/21 0549   metoprolol succinate (TOPROL-XL) 24 hr tablet 100 mg  100 mg Oral Daily Ottie Glazier, MD       midazolam (VERSED) injection 2 mg  2 mg Intravenous Q15 min PRN Bennie Pierini, MD   2 mg at 07/10/21 0158   midazolam (VERSED) injection 2 mg  2 mg Intravenous Q2H PRN Bennie Pierini, MD       norepinephrine (LEVOPHED) 49m in 2522mpremix infusion  2-10 mcg/min Intravenous Titrated Rust-Chester, BrHuel CoteNP   Stopped at 07/09/21 1503   oxyCODONE (Oxy IR/ROXICODONE) immediate release tablet 5 mg  5 mg Oral Q6H PRN KeBradly BienenstockNP       polyethylene glycol (MIRALAX / GLYCOLAX) packet 17 g  17 g Oral Daily PRN OuLang SnowNP       polyethylene glycol (MIRALAX / GLYCOLAX) packet 17 g  17 g Per Tube Daily Ouma, ElBing NeighborsNP       predniSONE (DELTASONE) tablet 10 mg  10 mg Per Tube Q breakfast Schertz, AdMichele McalpineMD       pregabalin (LYRICA) capsule 200 mg  200 mg Oral BID AlOttie GlazierMD       traMADol (UVeatrice Bourbontablet 50 mg  50 mg Oral Q12H KeBradly BienenstockNP         Abtx:  Anti-infectives (From admission, onward)    Start     Dose/Rate Route Frequency Ordered Stop   07/09/21 1000  ceFEPIme (MAXIPIME) 2 g in sodium chloride 0.9 % 100 mL IVPB  Status:  Discontinued        2 g 200 mL/hr over 30 Minutes Intravenous Every 8 hours 07/09/21 0823 07/10/21 1029   07/09/21 0600  ceFAZolin (ANCEF) IVPB 2g/100 mL premix        2 g 200  mL/hr over 30 Minutes Intravenous Every 8 hours 07/09/21 0311     07/08/21 2000  vancomycin (VANCOREADY) IVPB 1500 mg/300 mL  Status:  Discontinued         1,500 mg 150 mL/hr over 120 Minutes Intravenous Every 8 hours 07/08/21 1900 07/09/21 0311   07/08/21 1830  ceFEPIme (MAXIPIME) 2 g in sodium chloride 0.9 % 100 mL IVPB  Status:  Discontinued        2 g 200 mL/hr over 30 Minutes Intravenous Every 8 hours 07/08/21 1726 07/09/21 0311   07/08/21 0945  vancomycin (VANCOCIN) IVPB 1000 mg/200 mL premix        1,000 mg 200 mL/hr over 60 Minutes Intravenous  Once 07/08/21 0935 07/08/21 1203   07/08/21 0945  ceFEPIme (MAXIPIME) 2 g in sodium chloride 0.9 % 100 mL IVPB        2 g 200 mL/hr over 30 Minutes Intravenous  Once 07/08/21 0935 07/08/21 1146   07/08/21 0945  azithromycin (ZITHROMAX) 500 mg in sodium chloride 0.9 % 250 mL IVPB        500 mg 250 mL/hr over 60 Minutes Intravenous  Once 07/08/21 0935 07/08/21 1420       REVIEW OF SYSTEMS:  Pt was just extubated So review of systems not available: Objective:  VITALS:  BP 123/62   Pulse 72   Temp 99.9 F (37.7 C)   Resp (!) 25   Ht _0  (1.854 m)   Wt (!) 145.6 kg   SpO2 98%   BMI 42.35 kg/m  PHYSICAL EXAM:  General: Awake, responds to simple commands, dysphonia, some confusion  Head: Normocephalic, without obvious abnormality, atraumatic. Eyes: Conjunctivae clear, anicteric sclerae. Pupils are equal ENT Nares normal. No drainage or sinus tenderness. Lips, mucosa, and tongue normal. No Thrush Neck: Supple, symmetrical, no adenopathy, thyroid: non tender, tracheostomy scar Back: did not examine Lungs: b/l air entry- crepts bases Heart: tachycardia. Abdomen: Soft, non-tender,not distended. colostomy Extremities: edema legs Rt knee swollen Skin:          Tears, maceration Lymph: Cervical, supraclavicular normal. Neurologic: cannot be assessed Pertinent Labs Lab Results CBC    Component Value Date/Time   WBC 8.2 07/10/2021 0528   RBC 3.23 (L) 07/10/2021 0528   HGB 9.0 (L) 07/10/2021 0528   HCT 30.0 (L) 07/10/2021 0528   PLT 194 07/10/2021 0528   MCV 92.9  07/10/2021 0528   MCH 27.9 07/10/2021 0528   MCHC 30.0 07/10/2021 0528   RDW 18.2 (H) 07/10/2021 0528   LYMPHSABS 0.9 07/08/2021 0821   MONOABS 1.2 (H) 07/08/2021 0821   EOSABS 0.0 07/08/2021 0821   BASOSABS 0.0 07/08/2021 0821    CMP Latest Ref Rng & Units 07/10/2021 07/09/2021 07/08/2021  Glucose 70 - 99 mg/dL 263(H) 294(H) 287(H)  BUN 6 - 20 mg/dL 11 <5(L) <5(L)  Creatinine 0.61 - 1.24 mg/dL 0.52(L) 0.51(L) 0.50(L)  Sodium 135 - 145 mmol/L 138 134(L) 134(L)  Potassium 3.5 - 5.1 mmol/L 3.5 4.2 3.4(L)  Chloride 98 - 111 mmol/L 91(L) 86(L) 84(L)  CO2 22 - 32 mmol/L 40(H) 37(H) 41(H)  Calcium 8.9 - 10.3 mg/dL 8.6(L) 8.2(L) 9.0  Total Protein 6.5 - 8.1 g/dL - - 6.4(L)  Total Bilirubin 0.3 - 1.2 mg/dL - - 1.2  Alkaline Phos 38 - 126 U/L - - 56  AST 15 - 41 U/L - - 30  ALT 0 - 44 U/L - - 44      Microbiology: Recent Results (from the past  240 hour(s))  Blood Culture (routine x 2)     Status: Abnormal (Preliminary result)   Collection Time: 07/08/21  8:21 AM   Specimen: BLOOD  Result Value Ref Range Status   Specimen Description   Final    BLOOD RIGHT ANTECUBITAL Performed at [facility], [address] [place], [place] [address]    Special Requests   Final    BOTTLES DRAWN AEROBIC AND ANAEROBIC Blood Culture results may not be optimal due to an excessive volume of blood received in culture bottles Performed at [facility], [address] [place], [place] [address]    Culture  Setup Time   Final    Organism ID to follow [place] CRITICAL RESULT CALLED TO, READ BACK BY AND VERIFIED WITH: NATHAN BELEU AT [place] 07/09/2021 DLB Performed at [facility], [address], [place], [place] [address]    Culture (A)  Final    STAPHYLOCOCCUS AUREUS SUSCEPTIBILITIES TO FOLLOW Performed at [facility], [place] [address] [place], [place] [address]    Report Status PENDING  Incomplete  Blood  Culture (routine x 2)     Status: None (Preliminary result)   Collection Time: 07/08/21  8:21 AM   Specimen: BLOOD  Result Value Ref Range Status   Specimen Description BLOOD BLOOD RIGHT FOREARM  Final   Special Requests   Final    BOTTLES DRAWN AEROBIC AND ANAEROBIC Blood Culture results may not be optimal due to an excessive volume of blood received in culture bottles   Culture   Final    NO GROWTH 2 DAYS Performed at [facility], [address] [place], [place] [address]    Report Status PENDING  Incomplete  Blood Culture ID Panel (Reflexed)     Status: Abnormal   Collection Time: 07/08/21  8:21 AM  Result Value Ref Range Status   Enterococcus faecalis NOT DETECTED NOT DETECTED Final   Enterococcus Faecium NOT DETECTED NOT DETECTED Final   Listeria monocytogenes NOT DETECTED NOT DETECTED Final   Staphylococcus species DETECTED (A) NOT DETECTED Final    Comment: CRITICAL RESULT CALLED TO, READ BACK BY AND VERIFIED WITH: NATHAN BELEU AT 0230 07/09/2021 DLB    Staphylococcus aureus (BCID) DETECTED (A) NOT DETECTED Final    Comment: CRITICAL RESULT CALLED TO, READ BACK BY AND VERIFIED WITH: NATHAN BELEU AT 0230 07/09/2021 DLB    Staphylococcus epidermidis NOT DETECTED NOT DETECTED Final   Staphylococcus lugdunensis NOT DETECTED NOT DETECTED Final   Streptococcus species NOT DETECTED NOT DETECTED Final   Streptococcus agalactiae NOT DETECTED NOT DETECTED Final   Streptococcus pneumoniae NOT DETECTED NOT DETECTED Final   Streptococcus pyogenes NOT DETECTED NOT DETECTED Final   A.calcoaceticus-baumannii NOT DETECTED NOT DETECTED Final   Bacteroides fragilis NOT DETECTED NOT DETECTED Final   Enterobacterales NOT DETECTED NOT DETECTED Final   Enterobacter cloacae complex NOT DETECTED NOT DETECTED Final   Escherichia coli NOT DETECTED NOT DETECTED Final   Klebsiella aerogenes NOT DETECTED NOT DETECTED Final   Klebsiella oxytoca NOT DETECTED NOT DETECTED Final   Klebsiella  pneumoniae NOT DETECTED NOT DETECTED Final   Proteus species NOT DETECTED NOT DETECTED Final   Salmonella species NOT DETECTED NOT DETECTED Final   Serratia marcescens NOT DETECTED NOT DETECTED Final   Haemophilus influenzae NOT DETECTED NOT DETECTED Final   Neisseria meningitidis NOT DETECTED NOT DETECTED Final   Pseudomonas aeruginosa NOT DETECTED NOT DETECTED Final  Stenotrophomonas maltophilia NOT DETECTED NOT DETECTED Final   Candida albicans NOT DETECTED NOT DETECTED Final   Candida auris NOT DETECTED NOT DETECTED Final   Candida glabrata NOT DETECTED NOT DETECTED Final   Candida krusei NOT DETECTED NOT DETECTED Final   Candida parapsilosis NOT DETECTED NOT DETECTED Final   Candida tropicalis NOT DETECTED NOT DETECTED Final   Cryptococcus neoformans/gattii NOT DETECTED NOT DETECTED Final   Meth resistant mecA/C and MREJ NOT DETECTED NOT DETECTED Final    Comment: Performed at [facility], [address] [place], [place] [address]  Resp Panel by RT-PCR (Flu A&B, Covid) Nasopharyngeal Swab     Status: None   Collection Time: 07/08/21  1:59 PM   Specimen: Nasopharyngeal Swab; Nasopharyngeal(NP) swabs in vial transport medium  Result Value Ref Range Status   SARS Coronavirus 2 by RT PCR NEGATIVE NEGATIVE Final    Comment: (NOTE) SARS-CoV-2 target nucleic acids are NOT DETECTED.  The SARS-CoV-2 RNA is generally detectable in upper respiratory specimens during the acute phase of infection. The lowest concentration of SARS-CoV-2 viral copies this assay can detect is 138 copies/mL. A negative result does not preclude SARS-Cov-2 infection and should not be used as the sole basis for treatment or other patient management decisions. A negative result may occur with  improper specimen collection/handling, submission of specimen other than nasopharyngeal swab, presence of viral mutation(s) within the areas targeted by this assay, and inadequate number of viral copies(<138  copies/mL). A negative result must be combined with clinical observations, patient history, and epidemiological information. The expected result is Negative.  Fact Sheet for Patients:  EntrepreneurPulse.com.au  Fact Sheet for Healthcare Providers:  IncredibleEmployment.be  This test is no t yet approved or cleared by the [place] FDA and  has been authorized for detection and/or diagnosis of SARS-CoV-2 by FDA under an Emergency Use Authorization (EUA). This EUA will remain  in effect (meaning this test can be used) for the duration of the COVID-19 declaration under Section 564(b)(1) of the Act, 21 U.S.C.section 360bbb-3(b)(1), unless the authorization is terminated  or revoked sooner.       Influenza A by PCR NEGATIVE NEGATIVE Final   Influenza B by PCR NEGATIVE NEGATIVE Final    Comment: (NOTE) The Xpert Xpress SARS-CoV-2/FLU/RSV plus assay is intended as an aid in the diagnosis of influenza from Nasopharyngeal swab specimens and should not be used as a sole basis for treatment. Nasal washings and aspirates are unacceptable for Xpert Xpress SARS-CoV-2/FLU/RSV testing.  Fact Sheet for Patients: EntrepreneurPulse.com.au  Fact Sheet for Healthcare Providers: IncredibleEmployment.be  This test is not yet approved or cleared by the [place] FDA and has been authorized for detection and/or diagnosis of SARS-CoV-2 by FDA under an Emergency Use Authorization (EUA). This EUA will remain in effect (meaning this test can be used) for the duration of the COVID-19 declaration under Section 564(b)(1) of the Act, 21 U.S.C. section 360bbb-3(b)(1), unless the authorization is terminated or revoked.  Performed at [facility], [address] [place], [place] [address]   MRSA Next Gen by PCR, Nasal     Status: None   Collection Time: 07/08/21  4:24 PM   Specimen: Nasal Mucosa; Nasal Swab  Result  Value Ref Range Status   MRSA by PCR Next Gen NOT DETECTED NOT DETECTED Final    Comment: (NOTE) The GeneXpert MRSA Assay (FDA approved for NASAL specimens only), is one component of a comprehensive MRSA colonization surveillance program. It is not intended to  diagnose MRSA infection nor to guide or monitor treatment for MRSA infections. Test performance is not FDA approved in patients less than 78 years old. Performed at [facility], [place], [place] [address]   Respiratory (~20 pathogens) panel by PCR     Status: None   Collection Time: 07/08/21  5:30 PM   Specimen: Nasopharyngeal Swab; Respiratory  Result Value Ref Range Status   Adenovirus NOT DETECTED NOT DETECTED Final   Coronavirus 229E NOT DETECTED NOT DETECTED Final    Comment: (NOTE) The Coronavirus on the Respiratory Panel, DOES NOT test for the novel  Coronavirus (2019 nCoV)    Coronavirus HKU1 NOT DETECTED NOT DETECTED Final   Coronavirus NL63 NOT DETECTED NOT DETECTED Final   Coronavirus OC43 NOT DETECTED NOT DETECTED Final   Metapneumovirus NOT DETECTED NOT DETECTED Final   Rhinovirus / Enterovirus NOT DETECTED NOT DETECTED Final   Influenza A NOT DETECTED NOT DETECTED Final   Influenza B NOT DETECTED NOT DETECTED Final   Parainfluenza Virus 1 NOT DETECTED NOT DETECTED Final   Parainfluenza Virus 2 NOT DETECTED NOT DETECTED Final   Parainfluenza Virus 3 NOT DETECTED NOT DETECTED Final   Parainfluenza Virus 4 NOT DETECTED NOT DETECTED Final   Respiratory Syncytial Virus NOT DETECTED NOT DETECTED Final   Bordetella pertussis NOT DETECTED NOT DETECTED Final   Bordetella Parapertussis NOT DETECTED NOT DETECTED Final   Chlamydophila pneumoniae NOT DETECTED NOT DETECTED Final   Mycoplasma pneumoniae NOT DETECTED NOT DETECTED Final    Comment: Performed at [facility], [place]. [address], [place], [place] [address]  CULTURE, BLOOD (ROUTINE X 2) w Reflex to ID Panel     Status: None  (Preliminary result)   Collection Time: 07/09/21  2:39 PM   Specimen: BLOOD  Result Value Ref Range Status   Specimen Description BLOOD THUMB R  Final   Special Requests   Final    BOTTLES DRAWN AEROBIC AND ANAEROBIC Blood Culture results may not be optimal due to an inadequate volume of blood received in culture bottles   Culture   Final    NO GROWTH < 24 HOURS Performed at [facility], [address] [place], [place] [address]    Report Status PENDING  Incomplete  CULTURE, BLOOD (ROUTINE X 2) w Reflex to ID Panel     Status: None (Preliminary result)   Collection Time: 07/09/21  2:50 PM   Specimen: BLOOD RIGHT HAND  Result Value Ref Range Status   Specimen Description BLOOD RIGHT HAND  Final   Special Requests   Final    BOTTLES DRAWN AEROBIC AND ANAEROBIC Blood Culture adequate volume   Culture   Final    NO GROWTH < 24 HOURS Performed at [facility], [address] [place],  [address]    Report Status PENDING  Incomplete    IMAGING RESULTS: I have personally reviewed the films ? 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 ? ___________________________________________________ Discussed with care team Note:  This document was  prepared using Dragon voice recognition software and may include unintentional dictation errors.

## 2021-07-10 NOTE — TOC Initial Note (Signed)
Transition of Care [facility]) - Initial/Assessment Note    Patient Details  Name: Craig [name] MRN: [id number] Date of Birth: [birth year]  Transition of Care [facility]) CM/SW Contact:    Kerin Salen, RN Phone Number: 07/10/2021, 4:31 PM  Clinical Narrative: TOCRN spoke with patient and wife at bedside. Wife did most of the talking due to patient just coming off sedation. Wife states they have two kids in the home, ages 50 and 110, with patient family living next door and supportive with care, when she is working. Patient had HHPT with [facility] however feels he could also use Nursing. On oxygen 1.5L [place] continuous service by [employer]. [facility] [address]. Patient has colostomy bag and wheel chair. Had a hoyer lift and hospital bed, however picked up due to patient condition improved. Wife feels he need it back. Wife states patient gets 24hr care however if SNF is recommended they will consent. TOC to continue to track.                  Expected Discharge Plan: [place] Barriers to Discharge: Continued Medical Work up   Patient Goals and CMS Choice Patient states their goals for this hospitalization and ongoing recovery are:: To return home, however if SNF recommended will consent.      Expected Discharge Plan and Services Expected Discharge Plan: [place] arrangements for the past 2 months: Single Family Home                                      Prior Living Arrangements/Services Living arrangements for the past 2 months: Single Family Home Lives with:: Minor Children, Spouse Patient language and need for interpreter reviewed:: Yes Do you feel safe going back to the place where you live?: Yes      Need for Family Participation in Patient Care: Yes (Comment) Care giver support system in place?: Yes (comment) Current home services: Home PT Criminal Activity/Legal Involvement Pertinent to Current  Situation/Hospitalization: No - Comment as needed  Activities of Daily Living      Permission Sought/Granted                  Emotional Assessment Appearance:: Appears stated age Attitude/Demeanor/Rapport: Engaged Affect (typically observed): Accepting Orientation: : Oriented to Self, Oriented to Place Alcohol / Substance Use: Not Applicable Psych Involvement: No (comment)  Admission diagnosis:  Acute respiratory failure with hypoxia (HCC) [J96.01] Acute on chronic respiratory failure with hypoxemia (HCC) [J96.21] Sepsis with acute hypoxic respiratory failure, due to unspecified organism, unspecified whether septic shock present ([place]) [A41.9, R65.20, J96.01] Patient Active Problem List   Diagnosis Date Noted   Acute on chronic respiratory failure with hypoxemia ([place]) 07/08/2021   Neuropathic pain, arm 10/20/2020   Pressure injury of skin 09/18/2020   Staphylococcus aureus bacteremia    Fever    Metabolic encephalopathy    Acute respiratory failure with hypoxia ([place])    Acute hypoxemic respiratory failure due to COVID-19 ([place]) 08/09/2020   Severe sepsis ([place]) 08/09/2020   AKI (acute kidney injury) ([place]) 08/09/2020   Hypertension    Leak of anastomosis between gastrointestinal structures 07/08/2020   Pneumoperitoneum 07/08/2020   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 [address] Phone: [phone] Fax: [phone]  [id number] [place], [address] [place] [address] Phone: [phone] Fax: [phone]     Social Determinants of Health ([place]) Interventions    Readmission Risk Interventions Readmission Risk Prevention  Plan 07/10/2021  Transportation Screening Complete  PCP or Specialist Appt within 3-5 Days Complete  HRI or [place] Complete  Social Work Consult for [place] Planning/Counseling Complete  Palliative Care Screening Not Applicable  Medication Review [employer]) Complete  Some recent data might be hidden

## 2021-07-10 NOTE — Progress Notes (Signed)
Nutrition Follow-up  DOCUMENTATION CODES:   Obesity unspecified  INTERVENTION:   Nepro Shake po TID, each supplement provides 425 kcal and 19 grams protein  MVI po daily   Vitamin C 593m po BID   NUTRITION DIAGNOSIS:   Inadequate oral intake related to inability to eat as evidenced by NPO status. 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  Pt extubated this morning and initiated on mechanical soft diet. Met with pt and pt's wife in room today. Pt with some confusion today so majority of history provided by pt's wife at bedside. Wife reports pt with good appetite and oral intake at baseline. Wife reports that pt is bed bound at baseline. RD discussed with pt the importance of adequate nutrition needed to prevent lean muscle loss and to support wound healing. RD will add supplements and vitamins to help pt meet his estimated needs. Pt with weight gain pta; pt currently up ~70lbs from his last documented weight on 3/11.   Medications reviewed and include: colace, folic acid, lasix, insulin, heparin, miralax, prednisone, tramadol, cefazolin, precedex, pepcid,   Labs reviewed: K 3.5 wnl, creat 0.52(L), P 3.5 wnl, Mg 2.2 wnl Hgb 9.0(L), Hct 30.0(L) Cbgs- 208, 238, 262 x 24 hrs AIC 6.8(H)- 11/19  NUTRITION -  FOCUSED PHYSICAL EXAM:  Flowsheet Row Most Recent Value  Orbital Region No depletion  Upper Arm Region No depletion  Thoracic and Lumbar Region No depletion  Buccal Region No depletion  Temple Region No depletion  Clavicle Bone Region No depletion  Clavicle and Acromion Bone Region No depletion  Scapular Bone Region No depletion  Dorsal Hand No depletion  Patellar Region Unable to assess  Anterior Thigh Region No depletion  Posterior Calf Region Unable to assess  Edema (RD Assessment) Severe  [BLE]  Hair Reviewed  Eyes Reviewed  Mouth Reviewed  Skin Reviewed  Nails Reviewed   Diet Order:   Diet Order             DIET DYS 3 Room service appropriate? Yes; Fluid consistency: Thin  Diet effective now                  EDUCATION NEEDS:   No education needs have been identified at this time  Skin:  Skin Assessment: Reviewed RN Assessment (Stage II coccyx, open skin wounds)  Last BM:  07/09/21  Height:   Ht Readings from Last 1 Encounters:  07/08/21 _0  (1.854 m)    Weight:   Wt Readings from Last 1 Encounters:  07/09/21 (!) 145.6 kg    Ideal Body Weight:  83.6 kg  BMI:  Body mass index is 42.35 kg/m.  Estimated Nutritional Needs:   Kcal:  2700-3000kcal/day  Protein:  >125g/day  Fluid:  2.0L/day  CKoleen DistanceMS, RD, LDN Please [facility] to [facility] and/or RD on-call/weekend/after hours pager

## 2021-07-10 NOTE — Progress Notes (Addendum)
Pt extubated this morning @ outset of shift. Pt increasingly confused and anxious/agitated post extubation. 1x Benadryl given via IV.  Precedex gtt stopped @ 1400. Mental status improved over course of day, pt ox3. Conversing w/ wife @ bedside, hoarse quiet volume. Lidocaine patch applied to right knee, restarted scheduled tramadol and Klonopin. Pt diuresed w/ PO lasix, 1075 out. 450 out from colostomy bag, bag changed by wife.   This evening, continuous Nafcillin started @ 20.8 ml/hr per ID team.   Foley remains in place. Pt started on dysphagia 3 diet, tolerating diet well. Per Dr. Loni Muse, advance diet as tolerated.   Pt does have PRN Oxy if needed, not given during my shift.   Pt currently on 7L HFNC (Weaned from 10L HFNC).   Intermittently tachypneic and tachy d/t physical exertion or anxiety.   STAT MRI ordered for right knee. Still waiting on MRI for open slot.

## 2021-07-10 NOTE — Progress Notes (Signed)
Attempted to take patient for his Stat MRI without success. Once patient transferred to MRI stretcher, he started to c/o SOB on 6L/min via . Patient turned purple in the face and appeared to be in distress. Patient transferred back to his ICU bed and inclined to high fowlers position without incident. Patient HR as high as 150s, with O2 Level in the 80's. Once back to ICU, Metoprolol 5 mg administered with Oxycodone 5mg  for the pain to his right knee rated 8/10. Patient vitals currently stable. Will continue to monitor.

## 2021-07-10 NOTE — Progress Notes (Signed)
Pt extubated to 3Lnc per MD order

## 2021-07-10 NOTE — Progress Notes (Signed)
PHARMACY CONSULT NOTE - FOLLOW UP  Pharmacy Consult for Electrolyte Monitoring and Replacement   Recent Labs: Potassium (mmol/L)  Date Value  07/10/2021 3.5   Magnesium (mg/dL)  Date Value  07/10/2021 2.2   Calcium (mg/dL)  Date Value  07/10/2021 8.6 (L)   Albumin (g/dL)  Date Value  07/08/2021 3.0 (L)  07/08/2021 2.9 (L)   Phosphorus (mg/dL)  Date Value  07/10/2021 3.5   Sodium (mmol/L)  Date Value  07/10/2021 138     Assessment: 30YOM presenting with shortness of breath and sepsis. Currently intubated. Found to have MSSA bacteremia. PMH includes PE and MI.  Goal of Therapy:  Electrolytes WNL  Plan:  --Lytes remain WNL, no replacement indicated at this time --Continue to follow along  Lorna Dibble, PharmD, [facility] Clinical Pharmacist 07/10/2021 8:04 AM

## 2021-07-11 ENCOUNTER — Inpatient Hospital Stay: Payer: Medicaid Other

## 2021-07-11 ENCOUNTER — Inpatient Hospital Stay: Payer: Medicaid Other | Admitting: Anesthesiology

## 2021-07-11 ENCOUNTER — Encounter: Payer: Self-pay | Admitting: Internal Medicine

## 2021-07-11 ENCOUNTER — Encounter
Admission: EM | Disposition: A | Discharge status: Home Health | Payer: Self-pay | Source: Home / Self Care | Attending: Hospitalist

## 2021-07-11 HISTORY — PX: KNEE ARTHROSCOPY: SHX127

## 2021-07-11 HISTORY — PX: SYNOVIAL BIOPSY: SHX5041

## 2021-07-11 LAB — BLOOD GAS, ARTERIAL
Acid-Base Excess: 19.4 mmol/L — ABNORMAL HIGH (ref 0.0–2.0)
Bicarbonate: 45.9 mmol/L — ABNORMAL HIGH (ref 20.0–28.0)
FIO2: 0.35
O2 Saturation: 95 %
Patient temperature: 37
pCO2 arterial: 66 mmHg (ref 32.0–48.0)
pH, Arterial: 7.45 (ref 7.350–7.450)
pO2, Arterial: 72 mmHg — ABNORMAL LOW (ref 83.0–108.0)

## 2021-07-11 LAB — GLUCOSE, CAPILLARY
Glucose-Capillary: 157 mg/dL — ABNORMAL HIGH (ref 70–99)
Glucose-Capillary: 168 mg/dL — ABNORMAL HIGH (ref 70–99)
Glucose-Capillary: 158 mg/dL — ABNORMAL HIGH (ref 70–99)
Glucose-Capillary: 157 mg/dL — ABNORMAL HIGH (ref 70–99)
Glucose-Capillary: 198 mg/dL — ABNORMAL HIGH (ref 70–99)
Glucose-Capillary: 189 mg/dL — ABNORMAL HIGH (ref 70–99)

## 2021-07-11 LAB — CULTURE, BLOOD (ROUTINE X 2)

## 2021-07-11 LAB — BASIC METABOLIC PANEL
Anion gap: 10 (ref 5–15)
BUN: 9 mg/dL (ref 6–20)
CO2: 38 mmol/L — ABNORMAL HIGH (ref 22–32)
Calcium: 9 mg/dL (ref 8.9–10.3)
Chloride: 89 mmol/L — ABNORMAL LOW (ref 98–111)
Creatinine, Ser: 0.6 mg/dL — ABNORMAL LOW (ref 0.61–1.24)
GFR, Estimated: >60 mL/min (ref >60)
Glucose, Bld: 191 mg/dL — ABNORMAL HIGH (ref 70–99)
Potassium: 3.5 mmol/L (ref 3.5–5.1)
Sodium: 137 mmol/L (ref 135–145)

## 2021-07-11 LAB — PHOSPHORUS: Phosphorus: 5.2 mg/dL — ABNORMAL HIGH (ref 2.5–4.6)

## 2021-07-11 LAB — BODY FLUID CELL COUNT WITH DIFFERENTIAL

## 2021-07-11 LAB — LEGIONELLA PNEUMOPHILA SEROGP 1 UR AG: L. pneumophila Serogp 1 Ur Ag: NEGATIVE

## 2021-07-11 LAB — CBC
HCT: 32.6 % — ABNORMAL LOW (ref 39.0–52.0)
Hemoglobin: 9.6 g/dL — ABNORMAL LOW (ref 13.0–17.0)
MCH: 27.9 pg (ref 26.0–34.0)
MCHC: 29.4 g/dL — ABNORMAL LOW (ref 30.0–36.0)
MCV: 94.8 fL (ref 80.0–100.0)
Platelets: 191 10*3/uL (ref 150–400)
RBC: 3.44 MIL/uL — ABNORMAL LOW (ref 4.22–5.81)
RDW: 18.1 % — ABNORMAL HIGH (ref 11.5–15.5)
WBC: 8.9 10*3/uL (ref 4.0–10.5)
nRBC: 0 % (ref 0.0–0.2)

## 2021-07-11 LAB — MAGNESIUM: Magnesium: 1.9 mg/dL (ref 1.7–2.4)

## 2021-07-11 LAB — PROLACTIN: Prolactin: 32.8 ng/mL — ABNORMAL HIGH (ref 4.0–15.2)

## 2021-07-11 IMAGING — DX DG CHEST 1V PORT
1 series · 1 of 1 positions shown · non-contrast
Comparison: [DATE].

CLINICAL DATA: Encounter for intubation [1C] ([1C]-CM)

EXAM:
PORTABLE CHEST 1 VIEW

[chest ap]
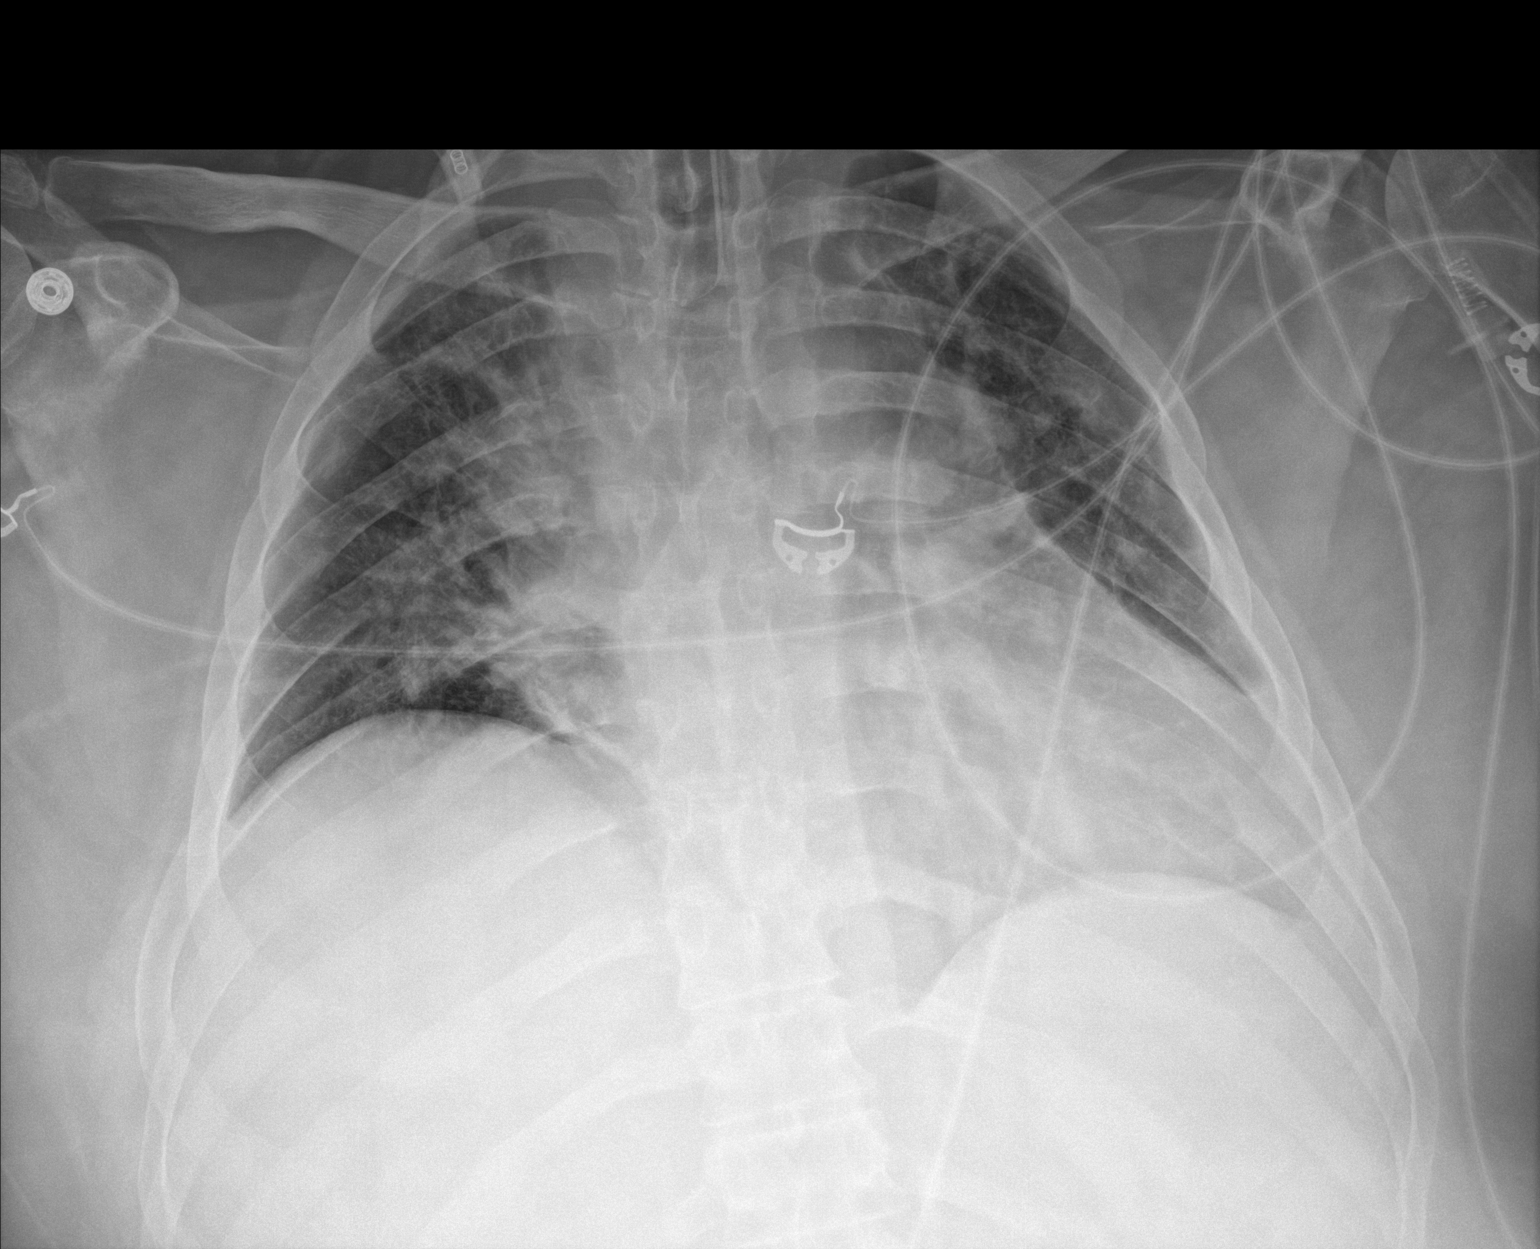

[1 of 1 positions shown; findings below may reference images not displayed]

FINDINGS: Endotracheal tube tip at the level of the clavicular heads. Similar
enlargement of the cardiomediastinal silhouette, likely also
accentuated by AP technique. Persistent airspace opacities
bilaterally. No visible pleural effusions or pneumothorax on this
semi erect radiograph.
IMPRESSION: 1. Endotracheal tube in good position with tip at the level of the
clavicular heads.
2. Persistent bilateral airspace opacities, unchanged.

## 2021-07-11 SURGERY — ARTHROSCOPY, KNEE
Anesthesia: General | Site: Knee | Laterality: Right

## 2021-07-11 MED ORDER — PROPOFOL 1000 MG/100ML IV EMUL
5.0000 ug/kg/min | INTRAVENOUS | Status: DC
  Administered 2021-07-11: 40 ug/kg/min via INTRAVENOUS
  Administered 2021-07-11: 20 ug/kg/min via INTRAVENOUS
  Administered 2021-07-11: 35 ug/kg/min via INTRAVENOUS
  Administered 2021-07-12: 50 ug/kg/min via INTRAVENOUS
  Administered 2021-07-12: 40 ug/kg/min via INTRAVENOUS
  Administered 2021-07-12: 35 ug/kg/min via INTRAVENOUS
  Filled 2021-07-11 (×7): qty 100

## 2021-07-11 MED ORDER — PHENYLEPHRINE HCL (PRESSORS) 10 MG/ML IV SOLN
INTRAVENOUS | Status: DC | PRN
  Administered 2021-07-11: 240 ug via INTRAVENOUS
  Administered 2021-07-11: 160 ug via INTRAVENOUS

## 2021-07-11 MED ORDER — VASOPRESSIN 20 UNIT/ML IV SOLN
INTRAVENOUS | Status: AC
  Filled 2021-07-11: qty 1

## 2021-07-11 MED ORDER — FENTANYL 2500MCG IN NS 250ML (10MCG/ML) PREMIX INFUSION
INTRAVENOUS | Status: AC
  Filled 2021-07-11: qty 250

## 2021-07-11 MED ORDER — PROPOFOL 10 MG/ML IV BOLUS
INTRAVENOUS | Status: DC | PRN
  Administered 2021-07-11: 150 mg via INTRAVENOUS

## 2021-07-11 MED ORDER — DEXMEDETOMIDINE HCL IN NACL 400 MCG/100ML IV SOLN
0.4000 ug/kg/h | INTRAVENOUS | Status: DC
  Administered 2021-07-11: 0.8 ug/kg/h via INTRAVENOUS

## 2021-07-11 MED ORDER — LIDOCAINE HCL (PF) 2 % IJ SOLN
INTRAMUSCULAR | Status: AC
  Filled 2021-07-11: qty 5

## 2021-07-11 MED ORDER — PROPOFOL 10 MG/ML IV BOLUS
INTRAVENOUS | Status: AC
  Filled 2021-07-11: qty 20

## 2021-07-11 MED ORDER — DEXAMETHASONE SODIUM PHOSPHATE 10 MG/ML IJ SOLN
INTRAMUSCULAR | Status: AC
  Filled 2021-07-11: qty 1

## 2021-07-11 MED ORDER — KETAMINE HCL 50 MG/5ML IJ SOSY
PREFILLED_SYRINGE | INTRAMUSCULAR | Status: AC
  Filled 2021-07-11: qty 5

## 2021-07-11 MED ORDER — VASOPRESSIN 20 UNIT/ML IV SOLN
INTRAVENOUS | Status: DC | PRN
  Administered 2021-07-11: 1 [IU] via INTRAVENOUS

## 2021-07-11 MED ORDER — ROCURONIUM BROMIDE 10 MG/ML (PF) SYRINGE
PREFILLED_SYRINGE | INTRAVENOUS | Status: AC
  Filled 2021-07-11: qty 10

## 2021-07-11 MED ORDER — FENTANYL CITRATE (PF) 100 MCG/2ML IJ SOLN
INTRAMUSCULAR | Status: DC | PRN
  Administered 2021-07-11 (×2): 50 ug via INTRAVENOUS

## 2021-07-11 MED ORDER — KETAMINE HCL 10 MG/ML IJ SOLN
INTRAMUSCULAR | Status: DC | PRN
  Administered 2021-07-11: 30 mg via INTRAVENOUS
  Administered 2021-07-11: 20 mg via INTRAVENOUS

## 2021-07-11 MED ORDER — ROCURONIUM BROMIDE 100 MG/10ML IV SOLN
INTRAVENOUS | Status: DC | PRN
Start: 2021-07-11 — End: 2021-07-11
  Administered 2021-07-11 (×2): 50 mg via INTRAVENOUS

## 2021-07-11 MED ORDER — LACTATED RINGERS IV SOLN: INTRAVENOUS | Status: DC | PRN

## 2021-07-11 MED ORDER — FENTANYL 2500MCG IN NS 250ML (10MCG/ML) PREMIX INFUSION
0.0000 ug/h | INTRAVENOUS | Status: DC
  Administered 2021-07-11: 300 ug/h via INTRAVENOUS
  Administered 2021-07-12: 250 ug/h via INTRAVENOUS
  Administered 2021-07-12: 400 ug/h via INTRAVENOUS
  Filled 2021-07-11 (×2): qty 250

## 2021-07-11 MED ORDER — LACTATED RINGERS IR SOLN
Status: DC | PRN
  Administered 2021-07-11 (×4): 3000 mL

## 2021-07-11 MED ORDER — HYDROMORPHONE HCL 1 MG/ML IJ SOLN
0.5000 mg | INTRAMUSCULAR | Status: DC | PRN
  Administered 2021-07-12 – 2021-07-14 (×5): 0.5 mg via INTRAVENOUS
  Filled 2021-07-11 (×5): qty 1

## 2021-07-11 MED ORDER — PROPOFOL 1000 MG/100ML IV EMUL
INTRAVENOUS | Status: AC
  Filled 2021-07-11: qty 100

## 2021-07-11 MED ORDER — MIDAZOLAM HCL 2 MG/2ML IJ SOLN
INTRAMUSCULAR | Status: DC | PRN
  Administered 2021-07-11 (×2): 1 mg via INTRAVENOUS

## 2021-07-11 MED ORDER — SUCCINYLCHOLINE CHLORIDE 200 MG/10ML IV SOSY
PREFILLED_SYRINGE | INTRAVENOUS | Status: DC | PRN
  Administered 2021-07-11: 200 mg via INTRAVENOUS

## 2021-07-11 MED ORDER — LIDOCAINE HCL (CARDIAC) PF 100 MG/5ML IV SOSY
PREFILLED_SYRINGE | INTRAVENOUS | Status: DC | PRN
  Administered 2021-07-11: 100 mg via INTRAVENOUS

## 2021-07-11 MED ORDER — ONDANSETRON HCL 4 MG/2ML IJ SOLN
INTRAMUSCULAR | Status: AC
  Filled 2021-07-11: qty 2

## 2021-07-11 MED ORDER — BUPIVACAINE-EPINEPHRINE (PF) 0.5% -1:200000 IJ SOLN
INTRAMUSCULAR | Status: DC | PRN
  Administered 2021-07-11: 20 mL

## 2021-07-11 MED ORDER — FUROSEMIDE 10 MG/ML IJ SOLN
40.0000 mg | Freq: Every day | INTRAMUSCULAR | Status: DC
  Administered 2021-07-12 – 2021-07-14 (×3): 40 mg via INTRAVENOUS
  Filled 2021-07-11 (×3): qty 4

## 2021-07-11 MED ORDER — MIDAZOLAM HCL 2 MG/2ML IJ SOLN
INTRAMUSCULAR | Status: AC
  Filled 2021-07-11: qty 2

## 2021-07-11 MED ORDER — ALBUMIN HUMAN 25 % IV SOLN
25.0000 g | Freq: Once | INTRAVENOUS | Status: AC
  Filled 2021-07-11: qty 100

## 2021-07-11 MED ORDER — ZINC OXIDE 20 % EX OINT
TOPICAL_OINTMENT | Freq: Every day | CUTANEOUS | Status: AC
  Filled 2021-07-11: qty 56.7
  Filled 2021-07-11: qty 30

## 2021-07-11 MED ORDER — FENTANYL CITRATE (PF) 100 MCG/2ML IJ SOLN
INTRAMUSCULAR | Status: AC
  Filled 2021-07-11: qty 2

## 2021-07-11 SURGICAL SUPPLY — 40 items
APL PRP STRL LF DISP 70% ISPRP (MISCELLANEOUS) ×1
BLADE INCISOR PLUS 4.5 (BLADE) ×2 IMPLANT
BNDG ELASTIC 4X5.8 VLCR STR LF (GAUZE/BANDAGES/DRESSINGS) IMPLANT
CHLORAPREP W/TINT 26 (MISCELLANEOUS) ×2 IMPLANT
CNTNR SPEC 2.5X3XGRAD LEK (MISCELLANEOUS) ×4
CONT SPEC 4OZ STER OR WHT (MISCELLANEOUS) ×4
CONT SPEC 4OZ STRL OR WHT (MISCELLANEOUS) ×4
CONTAINER SPEC 2.5X3XGRAD LEK (MISCELLANEOUS) ×4 IMPLANT
CUFF TOURN SGL QUICK 24 (TOURNIQUET CUFF)
CUFF TOURN SGL QUICK 34 (TOURNIQUET CUFF)
CUFF TRNQT CYL 24X4X16.5-23 (TOURNIQUET CUFF) IMPLANT
CUFF TRNQT CYL 34X4.125X (TOURNIQUET CUFF) IMPLANT
DRAPE ARTHRO LIMB 89X125 STRL (DRAPES) ×2 IMPLANT
DRAPE C-ARMOR (DRAPES) IMPLANT
GAUZE SPONGE 4X4 12PLY STRL (GAUZE/BANDAGES/DRESSINGS) ×2 IMPLANT
GLOVE SURG SYN 9.0  PF PI (GLOVE) ×1
GLOVE SURG SYN 9.0 PF PI (GLOVE) ×1 IMPLANT
GOWN SRG 2XL LVL 4 RGLN SLV (GOWNS) ×1 IMPLANT
GOWN STRL NON-REIN 2XL LVL4 (GOWNS) ×2
GOWN STRL REUS W/ TWL LRG LVL3 (GOWN DISPOSABLE) ×2 IMPLANT
GOWN STRL REUS W/TWL LRG LVL3 (GOWN DISPOSABLE) ×4
HEMOVAC LRG 3/16 (MISCELLANEOUS) ×2 IMPLANT
IV LACTATED RINGER IRRG 3000ML (IV SOLUTION) ×8
IV LR IRRIG 3000ML ARTHROMATIC (IV SOLUTION) ×4 IMPLANT
KIT TURNOVER KIT A (KITS) ×2 IMPLANT
MANIFOLD NEPTUNE II (INSTRUMENTS) ×2 IMPLANT
MAT HALF PREVALON HALF STRYKER (MISCELLANEOUS) ×2 IMPLANT
NEEDLE HYPO 22GX1.5 SAFETY (NEEDLE) ×2 IMPLANT
PACK ARTHROSCOPY KNEE (MISCELLANEOUS) ×2 IMPLANT
PAD ABD DERMACEA PRESS 5X9 (GAUZE/BANDAGES/DRESSINGS) ×4 IMPLANT
SCALPEL PROTECTED #11 DISP (BLADE) ×2 IMPLANT
SPONGE DRAIN TRACH 4X4 STRL 2S (GAUZE/BANDAGES/DRESSINGS) ×2 IMPLANT
SPONGE T-LAP 18X18 ~~LOC~~+RFID (SPONGE) ×2 IMPLANT
SUT ETHILON 4-0 (SUTURE) ×2
SUT ETHILON 4-0 FS2 18XMFL BLK (SUTURE) ×1
SUTURE ETHLN 4-0 FS2 18XMF BLK (SUTURE) ×1 IMPLANT
TUBING INFLOW SET DBFLO PUMP (TUBING) ×2 IMPLANT
TUBING OUTFLOW SET DBLFO PUMP (TUBING) ×2 IMPLANT
WAND COBLATION FLOW 50 (SURGICAL WAND) IMPLANT
WATER STERILE IRR 500ML POUR (IV SOLUTION) ×2 IMPLANT

## 2021-07-11 NOTE — Progress Notes (Signed)
PHARMACY CONSULT NOTE - FOLLOW UP  Pharmacy Consult for Electrolyte Monitoring and Replacement   Recent Labs: Potassium (mmol/L)  Date Value  07/11/2021 3.5   Magnesium (mg/dL)  Date Value  07/11/2021 1.9   Calcium (mg/dL)  Date Value  07/11/2021 9.0   Albumin (g/dL)  Date Value  07/08/2021 3.0 (L)  07/08/2021 2.9 (L)   Phosphorus (mg/dL)  Date Value  07/11/2021 5.2 (H)   Sodium (mmol/L)  Date Value  07/11/2021 137     Assessment: 30YOM presenting with shortness of breath and sepsis. Currently intubated. Found to have MSSA bacteremia. PMH includes PE and MI.  Goal of Therapy:  Electrolytes WNL  Plan:  --Lytes remain WNL, no replacement indicated at this time --Continue to follow along  Lorna Dibble, PharmD, [facility] Clinical Pharmacist 07/11/2021 9:37 AM

## 2021-07-11 NOTE — Progress Notes (Addendum)
Inpatient Diabetes Program Recommendations  AACE/ADA: New Consensus Statement on Inpatient Glycemic Control   Target Ranges:  Prepandial:   less than 140 mg/dL      Peak postprandial:   less than 180 mg/dL (1-2 hours)      Critically ill patients:  140 - 180 mg/dL     Latest Reference Range & Units 07/10/21 07:35 07/10/21 11:24 07/10/21 15:26 07/10/21 19:17 07/10/21 23:32 07/11/21 03:55 07/11/21 07:38  Glucose-Capillary 70 - 99 mg/dL 238 (H) 208 (H) 247 (H) 294 (H) 242 (H) 157 (H) 168 (H)    Latest Reference Range & Units 08/10/20 04:33 07/08/21 18:29  Hemoglobin A1C 4.8 - 5.6 % 5.7 (H) 6.8 (H)  (H): Data is abnormally high  Review of Glycemic Control  Diabetes history: No Outpatient Diabetes medications: NA Current orders for Inpatient glycemic control: Semglee 15 units BID, Novolog 0-20 units Q4H; Prednisone 10 mg daily   Inpatient Diabetes Program Recommendations:     HbgA1C:  A1C 6.8% on 07/08/21 indicating an average glucose of 148 mg/dl over the past 2-3 months. Per chart review, patient takes Prednisone chronically and was recently prescribed a Medrol dose pack on 06/07/21. If patient will be newly dx with DM this admission, please inform patient and nursing staff so patient can be educated; also consult diabetes coordinator.   NOTE: Patient admitted with respiratory failure, sepsis, pneumonia, and possible seizure. Per chart, patient does not have a hx of DM and takes steroids chronically. Noted patient was extubated on 07/10/21, is NPO today, and to have TEE today.  Addendum 07/11/21@11 :43-Patient is procedure at this time. Called patient's spouse [name]) and left a message on her voicemail. [name] returned call. [name] reports that patient does not have a dx of DM or preDM. She reports that he takes steroids chronically which do increase his glucose. She reports that he sees PCP along with several other specialist routinely and that at his last appointment with Rheumatology,  he was told that his lab glucose was elevated. [name] reports that since then, patient had starting watching his diet and limiting carbohydrates. She reports that patient is very limited with mobility due to his knee so he is not able to exercise much. Patient is hopefull that issue with knee will be addressed while inpatient and he will get back on his feet in the near future.  Inquired about knowledge of an A1C and [name] states she is familiar with what an A1C is. She notes that patient has several family members with DM and he is motivated to do what he needs to do so he doesn't develop DM. Discussed A1C of 5.7% on 08/10/20 and current A1C of 6.8%. Discussed that per ADA, if A1C is 6.5% or greater then it meets criteria for DM dx. Discussed that patient is chronically on steroids and at times he has to take higher doses of steroids which is likely impacting recent A1C (patient was on Medrol dose pack on 06/07/21). Informed [name] that at this point, it is not clear if attending provider here will dx patient with DM or not; but if they do our team should be consulted and will follow up with him while inpatient.    Thanks, Barnie Alderman, RN, MSN, CDE Diabetes Coordinator Inpatient Diabetes Program [phone] (Team Pager from 8am to 5pm)
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
## 2021-07-11 NOTE — Consult Note (Signed)
Brief cardiology consult note Asked to see patient for probable TEE for SBE  Impression Possible septic right knee Bacteremia Obesity Acute on chronic respiratory failure with hypoxemia Probable obstructive sleep apnea Immunosuppressed on chronic steroids Anasarca History of DVT on Xarelto  Plan We will arrange TEE for evaluation of possible SBE Continue ICU level care Agree with broad-spectrum antibiotic therapy for pneumonia and bacteremia Preop for knee surgery today Will try to arrange TEE while patient is intubated Dr. Humphrey Rolls to perform
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
## 2021-07-11 NOTE — Consult Note (Signed)
WOC Nurse Consult Note: Reason for Consult: Bedbound at home; skin related breakdown Wound type: Severe MASD (moisture associated skin breakdown) in combination with sheer and friction bilateral buttocks.  It is noted patient does not have hospital bed or hoyer lift at home any longer Intertriginous skin damage from moisture either incontinence or perspiration Skin crusting abdomen, may be stool, unclear in images Pressure Injury POA: NA Measurement:large area over the bilateral buttocks; reddened with superficial skin pealing  Reddened areas in the bilateral groin  Wound bed: see above  Drainage (amount, consistency, odor)  Periwound: intact  Dressing procedure/placement/frequency: Add triple paste for affected areas of the buttocks/coccyx daily and PRN Add bariatric air mattress; will need if transfers from the ICU as well Add antimicrobial wicking fabric for ITD  Of note patient has a colostomy; we have seen in previous admission when patient had [place] Nurse ostomy consult note Stoma type/location: LLQ colostomy Managed by patient and spouse/CG at home Provided needed Kellie Simmering numbers for ostomy skin barrier, pouch, and barrier rings   Re consult if needed, will not follow at this time. Thanks  Derico Mitton [employer], RN,CWOCN, CNS, [place] [phone])

## 2021-07-11 NOTE — Anesthesia Procedure Notes (Signed)
Procedure Name: Intubation Date/Time: 07/11/2021 3:10 PM Performed by: Lia Foyer, CRNA Pre-anesthesia Checklist: Patient identified, Emergency Drugs available, Suction available and Patient being monitored Patient Re-evaluated:Patient Re-evaluated prior to induction Oxygen Delivery Method: Circle system utilized Preoxygenation: Pre-oxygenation with 100% oxygen Induction Type: IV induction and Rapid sequence Laryngoscope Size: McGraph and 4 Grade View: Grade I Tube type: Oral Number of attempts: 1 Airway Equipment and Method: Stylet, Oral airway and Video-laryngoscopy Placement Confirmation: ETT inserted through vocal cords under direct vision, positive ETCO2 and breath sounds checked- equal and bilateral Secured at: 23 cm Tube secured with: Tape Dental Injury: Teeth and Oropharynx as per pre-operative assessment

## 2021-07-11 NOTE — Op Note (Signed)
07/11/2021  4:17 PM  PATIENT:  Craig [name]  30 y.o. male  PRE-OPERATIVE DIAGNOSIS:  chronic septic knee  POST-OPERATIVE DIAGNOSIS:  chronic septic knee  PROCEDURE:  Procedure(s): SYNOVIAL BIOPSY AND ARTHROSCOPIC IRRGATION AND DEBRIDEMENT OF SEPTIC KNEE (Right) SYNOVIAL BIOPSY (Right)  SURGEON: Laurene Footman, MD  ASSISTANTS: None  ANESTHESIA:   general  EBL:  Total I/O In: 211.7 [P.O.:60; I.V.:10; IV Piggyback:141.7] Out: 2650 [Urine:2650]  BLOOD ADMINISTERED:none  DRAINS: (1) Hemovact drain(s) in the knee with  Suction Open   LOCAL MEDICATIONS USED:  MARCAINE     SPECIMEN:  Source of Specimen:        Multiple synovial biopsies and fluid culture right knee synovial fluid  DISPOSITION OF SPECIMEN:   Microbiology  COUNTS:  YES  TOURNIQUET:  * Missing tourniquet times found for documented tourniquets in log: [id number] *  IMPLANTS: None  DICTATION: .Dragon Dictation patient was brought to the operating room and after adequate anesthesia was obtained the right leg was prepped and draped in the usual sterile fashion with a tourniquet applied the upper thigh but not required.  After patient identification and timeout procedure were completed inferior lateral portal was made and the arthroscope was introduced.  There was thick purulent blood-tinged fluid which came from the knee which was collected and sent for synovial fluid analysis as well as culture.  Next the arthroscope was introduced in the knee distended with extensive synovitis noted as well as exposed bone on approximately half the articular surface of the patella and femoral trochlea.  After making an inferior medial portal and placing a shaver after first obtaining biopsies to then inferomedial portal with multiple synovial specimens obtained for culture the synovium was shaved to get an arthroscopic debridement of the suprapatellar pouch medial lateral and anterior compartments.  There was significant articular  damage to all 3 compartments with intact ACL.  After thorough arthroscopic debridement of synovium and irrigation with 12,000 cc of LR a drain was placed through superior lateral portal and the port inferior portals closed with simple interrupted nylon suture.  30 cc of half percent Sensorcaine with epi were infiltrated in the area of the prior incisions as well as area of the drain site to help with postop analgesia.  Xeroform was placed in each incision along with 4 x 4's drain sponge ABDs web roll and Ace wrap.  PLAN OF CARE: Admit to inpatient   PATIENT DISPOSITION:  PACU - hemodynamically stable.

## 2021-07-11 NOTE — Transfer of Care (Signed)
Immediate Anesthesia Transfer of Care Note  Patient: Craig [name]  Procedure(s) Performed: SYNOVIAL BIOPSY AND ARTHROSCOPIC IRRGATION AND DEBRIDEMENT OF SEPTIC KNEE (Right: Knee) SYNOVIAL BIOPSY (Right: Knee)  Patient Location: ICU  Anesthesia Type:General  Level of Consciousness: sedated  Airway & Oxygen Therapy: Patient remains intubated per anesthesia plan , placed on ventilator  Post-op Assessment: Report given to RN and Post -op Vital signs reviewed and stable  Post vital signs: Reviewed and stable  Last Vitals:  Vitals Value Taken Time  BP    Temp    Pulse    Resp 16 07/11/21 1626  SpO2    Vitals shown include unvalidated device data.  Last Pain:  Vitals:   07/11/21 0900  TempSrc: Oral  PainSc:          Complications: No notable events documented.

## 2021-07-11 NOTE — Progress Notes (Signed)
Received pt from OR intubated. Pt placed on Vent and tape securing tub changed to tube holder with RN assistance

## 2021-07-11 NOTE — Anesthesia Preprocedure Evaluation (Signed)
Anesthesia Evaluation  Patient identified by MRN, date of birth, ID band Patient awake    Reviewed: Allergy & Precautions, NPO status , Patient's Chart, lab work & pertinent test results  History of Anesthesia Complications Negative for: history of anesthetic complications  Airway Mallampati: III  TM Distance: <3 FB Neck ROM: full    Dental  (+) Chipped, Poor Dentition   Pulmonary shortness of breath, pneumonia, unresolved, COPD,    Pulmonary exam normal        Cardiovascular hypertension, Normal cardiovascular exam+ dysrhythmias      Neuro/Psych negative neurological ROS  negative psych ROS   GI/Hepatic negative GI ROS, Neg liver ROS,   Endo/Other  negative endocrine ROS  Renal/GU Renal disease     Musculoskeletal  (+) Arthritis ,   Abdominal   Peds  Hematology negative hematology ROS (+)   Anesthesia Other Findings Septic  Past Medical History: No date: Gout No date: Hypertension No date: Rupture of bowel ([place])  Past Surgical History: 05/18/2020: COLONOSCOPY WITH PROPOFOL; N/A     Comment:  Procedure: COLONOSCOPY WITH PROPOFOL;  Surgeon: Benjamine Sprague, DO;  Location: [facility] ENDOSCOPY;  Service: General;               Laterality: N/A; 11/22/2019: COLOSTOMY; N/A     Comment:  Procedure: COLOSTOMY;  Surgeon: Herbert Pun,               MD;  Location: [facility] ORS;  Service: General;  Laterality:               N/A; 11/22/2019: LAPAROTOMY; N/A     Comment:  Procedure: EXPLORATORY LAPAROTOMY;  Surgeon:               Herbert Pun, MD;  Location: [facility] ORS;  Service:              General;  Laterality: N/A; 07/08/2020: LAPAROTOMY; N/A     Comment:  Procedure: EXPLORATORY LAPAROTOMY;  Surgeon:               Herbert Pun, MD;  Location: [facility] ORS;  Service:              General;  Laterality: N/A; 11/22/2019: PARTIAL COLECTOMY; N/A     Comment:  Procedure: PARTIAL COLECTOMY;  Surgeon:  Herbert Pun, MD;  Location: [facility] ORS;  Service: General;                Laterality: N/A; 09/14/2020: PEG PLACEMENT; N/A     Comment:  Procedure: PERCUTANEOUS ENDOSCOPIC GASTROSTOMY (PEG)               PLACEMENT;  Surgeon: Benjamine Sprague, DO;  Location: [facility]               ENDOSCOPY;  Service: General;  Laterality: N/A;  TRAVEL               CASE TRACH COVID POSITIVE 09/13/2020: TRACHEOSTOMY TUBE PLACEMENT; N/A     Comment:  Procedure: TRACHEOSTOMY;  Surgeon: Beverly Gust, MD;              Location: [facility] ORS;  Service: ENT;  Laterality: N/A; 06/28/2020: XI ROBOTIC ASSISTED COLOSTOMY TAKEDOWN; N/A     Comment:  Procedure: XI ROBOTIC ASSISTED COLOSTOMY TAKEDOWN  CONVERTED TO OPEN PROCEDURE;  Surgeon: Herbert Pun, MD;  Location: [facility] ORS;  Service: General;                Laterality: N/A;  BMI    Body Mass Index: 42.15 kg/m      Reproductive/Obstetrics negative OB ROS                             Anesthesia Physical Anesthesia Plan  ASA: 4 and emergent  Anesthesia Plan: General ETT   Post-op Pain Management:    Induction: Intravenous  PONV Risk Score and Plan: Ondansetron, Dexamethasone, Midazolam and Treatment may vary due to age or medical condition  Airway Management Planned: Oral ETT  Additional Equipment:   Intra-op Plan:   Post-operative Plan: Extubation in OR and Possible Post-op intubation/ventilation  Informed Consent: I have reviewed the patients History and Physical, chart, labs and discussed the procedure including the risks, benefits and alternatives for the proposed anesthesia with the patient or authorized representative who has indicated his/her understanding and acceptance.     Dental Advisory Given  Plan Discussed with: Anesthesiologist, CRNA and Surgeon  Anesthesia Plan Comments: (Patient and wife consented for risks of anesthesia including but not limited to:  -  adverse reactions to medications - damage to eyes, teeth, lips or other oral mucosa - nerve damage due to positioning  - sore throat or hoarseness - Damage to heart, brain, nerves, lungs, other parts of body or loss of life  They voiced understanding.)        Anesthesia Quick Evaluation

## 2021-07-11 NOTE — Consult Note (Signed)
Craig [name] is a 30 y.o. male  [id number]  Primary Cardiologist: Neoma Laming, MD Reason for Consultation: possible endocarditis  HPI: Craig [name] is a 30 y.o. male below listed past medical history with long COVID presents to the ER for malaise worsening shortness of breath chills leg swelling.  Has not been on any recent antibiotics.  No known sick contacts.  Denies any abdominal pain.  No dysuria.  Does feel like he is sick with the flu.  History complicated by perforated bowel colostomy as well as staph bacteremia multiple bacterial pneumonias since he was sick with COVID.   Review of Systems: Denies chest pain, dizziness. Shortness of breath, on O2 6 L/min.   Past Medical History:  Diagnosis Date   Gout    Hypertension    Rupture of bowel ([place])     Medications Prior to Admission  Medication Sig Dispense Refill   acetaminophen (TYLENOL) 650 MG CR tablet Take by mouth.     albuterol (ACCUNEB) 1.25 MG/3ML nebulizer solution Take by nebulization.     allopurinol (ZYLOPRIM) 300 MG tablet Take 300 mg by mouth daily.     budesonide (PULMICORT) 0.25 MG/2ML nebulizer solution Inhale into the lungs.     Cholecalciferol (VITAMIN D3) 25 MCG (1000 UT) CAPS Take by mouth.     Cyanocobalamin (VITAMIN B-12 IJ) Inject as directed.     cyanocobalamin 1000 MCG tablet Take 1,000 mcg by mouth daily.     DULoxetine (CYMBALTA) 60 MG capsule Take by mouth.     famotidine (PEPCID) 20 MG tablet Take by mouth.     folic acid (FOLVITE) 1 MG tablet Take by mouth.     furosemide (LASIX) 20 MG tablet Take 40 mg by mouth daily. Taking 2 tablets once daily     ibuprofen (ADVIL) 600 MG tablet Take 600 mg by mouth every 6 (six) hours as needed.     lidocaine (LIDODERM) 5 % 1 patch daily as needed.     lisinopril (ZESTRIL) 5 MG tablet Take by mouth.     metoprolol succinate (TOPROL-XL) 100 MG 24 hr tablet Take by mouth.     Multiple Vitamins-Minerals (MULTIVITAMIN WITH MINERALS) tablet  Take 1 tablet by mouth daily.     Omega-3 Fatty Acids (FISH OIL) 1000 MG CAPS Take by mouth.     ondansetron (ZOFRAN) 4 MG tablet Take by mouth.     potassium chloride (KLOR-CON) 10 MEQ tablet Take 10 mEq by mouth daily.     predniSONE (DELTASONE) 20 MG tablet Take 20 mg by mouth daily.     pregabalin (LYRICA) 200 MG capsule Take by mouth.     rivaroxaban (XARELTO) 20 MG TABS tablet Take by mouth.     traMADol (ULTRAM) 50 MG tablet Take by mouth.     cetirizine (ZYRTEC) 10 MG tablet Take 10 mg by mouth daily. (Patient not taking: Reported on 07/08/2021)     clonazePAM (KLONOPIN) 1 MG tablet Take by mouth. (Patient not taking: Reported on 07/08/2021)     [employer] SALINE NASAL SPRAY 0.65 % nasal spray SMARTSIG:1 Spray(s) Both Nares Every 4 Hours PRN     doxazosin (CARDURA) 1 MG tablet Take by mouth. (Patient not taking: Reported on 07/08/2021)     doxycycline (VIBRAMYCIN) 100 MG capsule Take 100 mg by mouth 2 (two) times daily.     fluticasone-salmeterol (ADVAIR) 100-50 MCG/ACT AEPB Inhale into the lungs. (Patient not taking: Reported on 03/08/2021)     HYDROcodone-acetaminophen (NORCO/VICODIN) 5-325  MG tablet Take 1 tablet by mouth every 6 (six) hours as needed. (Patient not taking: Reported on 03/27/2021)     melatonin 3 MG TABS tablet Take 6 mg by mouth at bedtime as needed. (Patient not taking: Reported on 07/08/2021)     methotrexate (RHEUMATREX) 2.5 MG tablet Take by mouth.     OVER THE COUNTER MEDICATION      OXYGEN Inhale into the lungs. 1.5 L with sleep     predniSONE (DELTASONE) 5 MG tablet 6 pills x1day, 5x1,4x1,3x1,2x1,1x1 (Patient not taking: Reported on 03/08/2021)        vitamin C  500 mg Oral BID   chlorhexidine gluconate (MEDLINE KIT)  15 mL Mouth Rinse BID   Chlorhexidine Gluconate Cloth  6 each Topical Q0600   clonazePAM  1 mg Oral Daily   COVID-19 mRNA bivalent vaccine (Moderna)  0.5 mL Intramuscular Once   docusate  100 mg Per Tube BID   DULoxetine  60 mg Oral Daily   feeding  supplement (NEPRO CARB STEADY)  237 mL Oral TID BM   folic acid  1 mg Per Tube Daily   furosemide  40 mg Intravenous Daily   heparin injection (subcutaneous)  5,000 Units Subcutaneous Q8H   insulin aspart  0-20 Units Subcutaneous Q4H   insulin glargine-yfgn  15 Units Subcutaneous BID   lidocaine  1 patch Transdermal Q24H   mouth rinse  15 mL Mouth Rinse 10 times per day   metoprolol succinate  100 mg Oral Daily   multivitamin with minerals  1 tablet Oral Daily   nicotine  21 mg Transdermal Daily   polyethylene glycol  17 g Per Tube Daily   predniSONE  10 mg Per Tube Q breakfast   pregabalin  200 mg Oral BID    Infusions:  sodium chloride 5 mL/hr at 07/11/21 0800   sodium chloride Stopped (07/10/21 0618)   albumin human     dexmedetomidine (PRECEDEX) IV infusion Stopped (07/10/21 1400)   famotidine (PEPCID) IV 20 mg (07/11/21 0942)   nafcillin (NAFCIL) continuous infusion 20.8 mL/hr at 07/11/21 0800   norepinephrine (LEVOPHED) Adult infusion Stopped (07/09/21 1503)    Allergies  Allergen Reactions   Amoxicillin Rash    Tolerated cefepime and cefazolin 08/2020.    Social History   Socioeconomic History   Marital status: Married    Spouse name: Not on file   Number of children: Not on file   Years of education: Not on file   Highest education level: Not on file  Occupational History   Not on file  Tobacco Use   Smoking status: Never   Smokeless tobacco: Current    Types: Chew   Tobacco comments:    Not ready to quit.   Vaping Use   Vaping Use: Never used  Substance and Sexual Activity   Alcohol use: Yes    Comment: pint to a fifth per day per patient . Patient reports he hasn't had anything to drink in three weeks.   Drug use: Never   Sexual activity: Yes    Birth control/protection: None  Other Topics Concern   Not on file  Social History Narrative   Not on file   Social Determinants of Health   Financial Resource Strain: Not on file  Food Insecurity: Not  on file  Transportation Needs: Not on file  Physical Activity: Not on file  Stress: Not on file  Social Connections: Not on file  Intimate Partner Violence: Not on file  Family History  Problem Relation Age of Onset   Healthy Mother    Healthy Father     PHYSICAL EXAM: Vitals:   07/11/21 0800 07/11/21 0900  BP: 122/77 (!) 144/80  Pulse: (!) 116 (!) 131  Resp:    Temp:  98.5 F (36.9 C)  SpO2: 100% 100%     Intake/Output Summary (Last 24 hours) at 07/11/2021 1100 Last data filed at 07/11/2021 0945 Gross per 24 hour  Intake 535.05 ml  Output 3075 ml  Net -2539.95 ml    General:  Well appearing. No respiratory difficulty HEENT: normal Neck: supple. no JVD. Carotids 2+ bilat; no bruits. No lymphadenopathy or thryomegaly appreciated. Cor: PMI nondisplaced. Regular rate & rhythm. No rubs, gallops or murmurs. Lungs: clear Abdomen: soft, nontender, nondistended. No hepatosplenomegaly. No bruits or masses. Good bowel sounds. Extremities: no cyanosis, clubbing, rash, edema Neuro: alert & oriented x 3, cranial nerves grossly intact. moves all 4 extremities w/o difficulty. Affect pleasant.  ECG: sinus tachycardia, HR 138 bpm, short PR  Results for orders placed or performed during the hospital encounter of 07/08/21 (from the past 24 hour(s))  Glucose, capillary     Status: Abnormal   Collection Time: 07/10/21 11:24 AM  Result Value Ref Range   Glucose-Capillary 208 (H) 70 - 99 mg/dL  Glucose, capillary     Status: Abnormal   Collection Time: 07/10/21  3:26 PM  Result Value Ref Range   Glucose-Capillary 247 (H) 70 - 99 mg/dL  Glucose, capillary     Status: Abnormal   Collection Time: 07/10/21  7:17 PM  Result Value Ref Range   Glucose-Capillary 294 (H) 70 - 99 mg/dL   Comment 1 Notify RN    Comment 2 Document in Chart   Glucose, capillary     Status: Abnormal   Collection Time: 07/10/21 11:32 PM  Result Value Ref Range   Glucose-Capillary 242 (H) 70 - 99 mg/dL    Comment 1 Notify RN    Comment 2 Document in Chart   Glucose, capillary     Status: Abnormal   Collection Time: 07/11/21  3:55 AM  Result Value Ref Range   Glucose-Capillary 157 (H) 70 - 99 mg/dL   Comment 1 Notify RN    Comment 2 Document in Chart   CBC     Status: Abnormal   Collection Time: 07/11/21  6:05 AM  Result Value Ref Range   WBC 8.9 4.0 - 10.5 K/uL   RBC 3.44 (L) 4.22 - 5.81 MIL/uL   Hemoglobin 9.6 (L) 13.0 - 17.0 g/dL   HCT 32.6 (L) 39.0 - 52.0 %   MCV 94.8 80.0 - 100.0 fL   MCH 27.9 26.0 - 34.0 pg   MCHC 29.4 (L) 30.0 - 36.0 g/dL   RDW 18.1 (H) 11.5 - 15.5 %   Platelets 191 150 - 400 K/uL   nRBC 0.0 0.0 - 0.2 %  Basic metabolic panel     Status: Abnormal   Collection Time: 07/11/21  6:05 AM  Result Value Ref Range   Sodium 137 135 - 145 mmol/L   Potassium 3.5 3.5 - 5.1 mmol/L   Chloride 89 (L) 98 - 111 mmol/L   CO2 38 (H) 22 - 32 mmol/L   Glucose, Bld 191 (H) 70 - 99 mg/dL   BUN 9 6 - 20 mg/dL   Creatinine, Ser 0.60 (L) 0.61 - 1.24 mg/dL   Calcium 9.0 8.9 - 10.3 mg/dL   GFR, Estimated >60 >60 mL/min  Anion gap 10 5 - 15  Magnesium     Status: None   Collection Time: 07/11/21  6:05 AM  Result Value Ref Range   Magnesium 1.9 1.7 - 2.4 mg/dL  Phosphorus     Status: Abnormal   Collection Time: 07/11/21  6:05 AM  Result Value Ref Range   Phosphorus 5.2 (H) 2.5 - 4.6 mg/dL  Glucose, capillary     Status: Abnormal   Collection Time: 07/11/21  7:38 AM  Result Value Ref Range   Glucose-Capillary 168 (H) 70 - 99 mg/dL   ECHOCARDIOGRAM COMPLETE BUBBLE STUDY  Result Date: 07/09/2021    ECHOCARDIOGRAM REPORT   Patient Name:   Craig [name] Date of Exam: 07/09/2021 Medical Rec #:  [id number]             Height:       73.0 in Accession #:    [id number]            Weight:       321.0 lb Date of Birth:  Aug 18, 1991              BSA:          2.631 m Patient Age:    22 years              BP:           117/59 mmHg Patient Gender: M                     HR:            82 bpm. Exam Location:  [facility] Procedure: 2D Echo, Intracardiac Opacification Agent and Saline Contrast Bubble            Study Indications:     Bacteremia 790.7  History:         Patient has no prior history of Echocardiogram examinations.  Sonographer:     Kathlen Brunswick RDCS Referring Phys:  [id number] ADAM ROSS SCHERTZ Diagnosing Phys: Ida Rogue MD  Sonographer Comments: Suboptimal apical window, Technically challenging study due to limited acoustic windows, patient is morbidly obese, echo performed with patient supine and on artificial respirator and suboptimal parasternal window. Image acquisition  challenging due to patient body habitus and Image acquisition challenging due to respiratory motion. IMPRESSIONS  1. No valve vegetation noted.  2. Left ventricular ejection fraction, by estimation, is 50 to 55%. The left ventricle has low normal function. The left ventricle has no regional wall motion abnormalities. Left ventricular diastolic parameters were normal.  3. Right ventricular systolic function is mildly reduced. The right ventricular size is mildly enlarged. Tricuspid regurgitation signal is inadequate for assessing PA pressure.  4. The mitral valve is normal in structure. No evidence of mitral valve regurgitation. No evidence of mitral stenosis.  5. The aortic valve was not well visualized. Aortic valve regurgitation is not visualized. No aortic stenosis is present. FINDINGS  Left Ventricle: Left ventricular ejection fraction, by estimation, is 50 to 55%. The left ventricle has low normal function. The left ventricle has no regional wall motion abnormalities. Definity contrast agent was given IV to delineate the left ventricular endocardial borders. The left ventricular internal cavity size was normal in size. There is no left ventricular hypertrophy. Left ventricular diastolic parameters were normal. Right Ventricle: The right ventricular size is mildly enlarged. No increase in right ventricular  wall thickness. Right ventricular systolic function is mildly reduced. Tricuspid regurgitation signal is inadequate for assessing PA pressure.  Left Atrium: Left atrial size was normal in size. Right Atrium: Right atrial size was normal in size. Pericardium: There is no evidence of pericardial effusion. Mitral Valve: The mitral valve is normal in structure. No evidence of mitral valve regurgitation. No evidence of mitral valve stenosis. Tricuspid Valve: The tricuspid valve is normal in structure. Tricuspid valve regurgitation is not demonstrated. No evidence of tricuspid stenosis. Aortic Valve: The aortic valve was not well visualized. Aortic valve regurgitation is not visualized. No aortic stenosis is present. Aortic valve peak gradient measures 4.9 mmHg. Pulmonic Valve: The pulmonic valve was normal in structure. Pulmonic valve regurgitation is not visualized. No evidence of pulmonic stenosis. Aorta: The aortic root is normal in size and structure. Venous: The pulmonary veins were not well visualized. The inferior vena cava was not well visualized. IAS/Shunts: No atrial level shunt detected by color flow Doppler. Agitated saline contrast was given intravenously to evaluate for intracardiac shunting.  LEFT VENTRICLE PLAX 2D LVIDd:         4.20 cm   Diastology LVIDs:         2.90 cm   LV e' medial:    11.00 cm/s LV PW:         1.20 cm   LV E/e' medial:  7.9 LV IVS:        1.30 cm   LV e' lateral:   8.38 cm/s LVOT diam:     2.20 cm   LV E/e' lateral: 10.4 LV SV:         46 LV SV Index:   17 LVOT Area:     3.80 cm  RIGHT VENTRICLE RV Basal diam:  3.50 cm RV S prime:     18.60 cm/s TAPSE (M-mode): 1.9 cm LEFT ATRIUM         Index       RIGHT ATRIUM           Index LA diam:    3.20 cm 1.22 cm/m  RA Area:     13.70 cm                                 RA Volume:   35.40 ml  13.46 ml/m  AORTIC VALVE                 PULMONIC VALVE AV Area (Vmax): 2.66 cm     PV Vmax:       0.94 m/s AV Vmax:        111.00 cm/s  PV Peak  grad:  3.5 mmHg AV Peak Grad:   4.9 mmHg LVOT Vmax:      77.70 cm/s LVOT Vmean:     55.700 cm/s LVOT VTI:       0.120 m  AORTA Ao Root diam: 3.20 cm MITRAL VALVE MV Area (PHT): 3.58 cm    SHUNTS MV Decel Time: 212 msec    Systemic VTI:  0.12 m MV E velocity: 87.40 cm/s  Systemic Diam: 2.20 cm MV A velocity: 46.70 cm/s MV E/A ratio:  1.87 Ida Rogue MD Electronically signed by Ida Rogue MD Signature Date/Time: 07/09/2021/12:56:41 PM    Final      ASSESSMENT AND PLAN: TEE ordered to rule out bacterial endocarditis. Discussed plan for TEE with patient. Discussed risks and benefits. Questions answered. Patient ready to proceed with TEE.   Engineer, drilling FNP-C

## 2021-07-12 ENCOUNTER — Encounter: Payer: Self-pay | Admitting: Orthopedic Surgery

## 2021-07-12 ENCOUNTER — Inpatient Hospital Stay (HOSPITAL_COMMUNITY)
Admit: 2021-07-12 | Discharge: 2021-07-12 | Disposition: A | Discharge status: Home / Self Care | Payer: Medicaid Other | Attending: Pulmonary Disease | Admitting: Pulmonary Disease

## 2021-07-12 ENCOUNTER — Inpatient Hospital Stay: Admit: 2021-07-12 | Payer: Medicaid Other

## 2021-07-12 ENCOUNTER — Inpatient Hospital Stay
Admission: RE | Admit: 2021-07-12 | Discharge: 2021-07-12 | Disposition: A | Discharge status: Home / Self Care | Payer: Medicaid Other | Source: Ambulatory Visit | Attending: Sports Medicine | Admitting: Sports Medicine

## 2021-07-12 DIAGNOSIS — R7881 Bacteremia: Secondary | ICD-10-CM | POA: Diagnosis not present

## 2021-07-12 LAB — GLUCOSE, CAPILLARY
Glucose-Capillary: 118 mg/dL — ABNORMAL HIGH (ref 70–99)
Glucose-Capillary: 92 mg/dL (ref 70–99)
Glucose-Capillary: 90 mg/dL (ref 70–99)
Glucose-Capillary: 142 mg/dL — ABNORMAL HIGH (ref 70–99)
Glucose-Capillary: 207 mg/dL — ABNORMAL HIGH (ref 70–99)
Glucose-Capillary: 198 mg/dL — ABNORMAL HIGH (ref 70–99)

## 2021-07-12 LAB — CULTURE, BLOOD (ROUTINE X 2)

## 2021-07-12 LAB — HEPATIC FUNCTION PANEL
ALT: 24 U/L (ref 0–44)
AST: 15 U/L (ref 15–41)
Albumin: 2.6 g/dL — ABNORMAL LOW (ref 3.5–5.0)
Alkaline Phosphatase: 42 U/L (ref 38–126)
Bilirubin, Direct: 0.2 mg/dL (ref 0.0–0.2)
Indirect Bilirubin: 0.7 mg/dL (ref 0.3–0.9)
Total Bilirubin: 0.9 mg/dL (ref 0.3–1.2)
Total Protein: 5.5 g/dL — ABNORMAL LOW (ref 6.5–8.1)

## 2021-07-12 LAB — BASIC METABOLIC PANEL
Anion gap: 7 (ref 5–15)
BUN: 7 mg/dL (ref 6–20)
CO2: 43 mmol/L — ABNORMAL HIGH (ref 22–32)
Calcium: 8.3 mg/dL — ABNORMAL LOW (ref 8.9–10.3)
Chloride: 87 mmol/L — ABNORMAL LOW (ref 98–111)
Creatinine, Ser: 0.57 mg/dL — ABNORMAL LOW (ref 0.61–1.24)
GFR, Estimated: >60 mL/min (ref >60)
Glucose, Bld: 104 mg/dL — ABNORMAL HIGH (ref 70–99)
Potassium: 3.1 mmol/L — ABNORMAL LOW (ref 3.5–5.1)
Sodium: 137 mmol/L (ref 135–145)

## 2021-07-12 LAB — CBC
HCT: 27.7 % — ABNORMAL LOW (ref 39.0–52.0)
Hemoglobin: 8.3 g/dL — ABNORMAL LOW (ref 13.0–17.0)
MCH: 28.1 pg (ref 26.0–34.0)
MCHC: 30 g/dL (ref 30.0–36.0)
MCV: 93.9 fL (ref 80.0–100.0)
Platelets: 191 10*3/uL (ref 150–400)
RBC: 2.95 MIL/uL — ABNORMAL LOW (ref 4.22–5.81)
RDW: 17.7 % — ABNORMAL HIGH (ref 11.5–15.5)
WBC: 7.7 10*3/uL (ref 4.0–10.5)
nRBC: 0 % (ref 0.0–0.2)

## 2021-07-12 LAB — MAGNESIUM: Magnesium: 1.8 mg/dL (ref 1.7–2.4)

## 2021-07-12 LAB — PATHOLOGIST SMEAR REVIEW

## 2021-07-12 LAB — TRIGLYCERIDES: Triglycerides: 268 mg/dL — ABNORMAL HIGH (ref <150)

## 2021-07-12 LAB — PHOSPHORUS: Phosphorus: 2.8 mg/dL (ref 2.5–4.6)

## 2021-07-12 MED ORDER — POTASSIUM CHLORIDE 10 MEQ/100ML IV SOLN
10.0000 meq | INTRAVENOUS | Status: AC
  Administered 2021-07-12 (×4): 10 meq via INTRAVENOUS
  Filled 2021-07-12 (×4): qty 100

## 2021-07-12 MED ORDER — MAGNESIUM SULFATE 2 GM/50ML IV SOLN
2.0000 g | Freq: Once | INTRAVENOUS | Status: AC
  Administered 2021-07-12: 2 g via INTRAVENOUS
  Filled 2021-07-12: qty 50

## 2021-07-12 NOTE — Progress Notes (Signed)
*  PRELIMINARY RESULTS* Echocardiogram Echocardiogram Transesophageal has been performed.  Craig [name] Craig [name] 07/12/2021, 1:00 PM

## 2021-07-12 NOTE — Progress Notes (Signed)
Nutrition Follow-up  DOCUMENTATION CODES:   Obesity unspecified  INTERVENTION:   RD will add supplements once diet advanced:  Recommend Nepro Shake po TID, each supplement provides 425 kcal and 19 grams protein  Continue MVI po daily   Continue Vitamin C 599m po BID   NUTRITION DIAGNOSIS:   Inadequate oral intake related to inability to eat as evidenced by NPO status.  GOAL:   Patient will meet greater than or equal to 90% of their needs -not met   MONITOR:   Diet advancement, Labs, Weight trends, Skin, I & O's  ASSESSMENT:   30y.o. 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  Pt s/p TEE today; no endocarditis found.   Pt found to have septic arthritis in his right knee; pt now s/p I & D 11/22. Pt returned to ICU ventilated but has now been extubated today and is tolerating well. Recommend continue supplements and vitamins once pt initiated on a diet. Pt with fairly good appetite and oral intake in hospital thus far. Refeed labs stable. Per chart, pt is weight stable since admission.   Medications reviewed and include: vitamin C, colace, folic acid, lasix, insulin, heparin, MVI, nicotine, miralax, prednisone, pepcid, Mg sulfate, nafcillin  Labs reviewed: K 3.1(L), creat 0.57(L), P 2.8 wnl, Mg 1.8 wnl Hgb 8.3(L), Hct 27.7(L) Cbgs- 90, 92, 118 x 24 hrs  Diet Order:    Diet Order     None      EDUCATION NEEDS:   No education needs have been identified at this time  Skin:   Skin Assessment: Reviewed RN Assessment (Stage II coccyx, open skin wounds)  Last BM:  11/22- via ostomy  Height:   Ht Readings from Last 1 Encounters:  07/08/21 '6\' 1"'  (1.854 m)    Weight:   Wt Readings from Last 1 Encounters:  07/12/21 (!) 145.1 kg    Ideal Body Weight:  83.6 kg  BMI:  Body mass index is 42.2 kg/m.  Estimated Nutritional Needs:   Kcal:  2700-3000kcal/day  Protein:  >125g/day  Fluid:  2.0L/day  CKoleen DistanceMS, RD, LDN Please refer to [facility] and/or RD on-call/weekend/after hours pager

## 2021-07-12 NOTE — Progress Notes (Signed)
Elizabeth NP made aware of patient hypotension due to sedation. No new orders given at this time.

## 2021-07-12 NOTE — Anesthesia Postprocedure Evaluation (Signed)
Anesthesia Post Note  Patient: Craig [name]  Procedure(s) Performed: SYNOVIAL BIOPSY AND ARTHROSCOPIC IRRGATION AND DEBRIDEMENT OF SEPTIC KNEE (Right: Knee) SYNOVIAL BIOPSY (Right: Knee)  Patient location during evaluation: ICU Anesthesia Type: General Level of consciousness: awake and alert Pain management: pain level controlled Vital Signs Assessment: post-procedure vital signs reviewed and stable Respiratory status: patient on ventilator - see flowsheet for VS Cardiovascular status: stable Postop Assessment: no apparent nausea or vomiting Anesthetic complications: no   No notable events documented.   Last Vitals:  Vitals:   07/12/21 0630 07/12/21 0715  BP:  (!) 101/50  Pulse: 100 98  Resp: 17 14  Temp:    SpO2: 98% 98%    Last Pain:  Vitals:   07/12/21 0400  TempSrc:   PainSc: 0-No pain                 Iyana Topor Lily Peer

## 2021-07-12 NOTE — Progress Notes (Signed)
Transesophageal Echocardiogram :  Indication: bacteremia, endocardittis Requesting/ordering  physician: Dr. Lanney Gins  Procedure: The patient was heavily sedated per ICU nurse, patient was already intubatyed and sedated per ICU team. Using digital technique an omniplane probe was advanced into the distal esophagus without incident.   Moderate sedation: 1. Sedation used:  per ICU nurse, patient intubated  See report in EPIC  for complete details: In brief,  No valve endocardiltis transgastric imaging revealed normal LV function with no RWMAs and no mural apical thrombus.  .  Estimated ejection fraction was 55%.  Right sided cardiac chambers were normal with no evidence of pulmonary hypertension.  Imaging of the septum showed no ASD or VSD Bubble study was negative for shunt 2D and color flow confirmed no PFO  The LA was well visualized in orthogonal views.  There was no spontaneous contrast and no thrombus in the LA and LA appendage   The descending thoracic aorta had no  mural aortic debris with no evidence of aneurysmal dilation or disection   Shyquan Stallbaumer 07/12/2021 1:10 PM

## 2021-07-12 NOTE — Progress Notes (Signed)
Pt was suctioned prior to extubation for a small amount of thick white secretions. Per Dr's order, he was extubated and placed on a HFNC at 6L He is voicing and is without stridor.

## 2021-07-12 NOTE — Progress Notes (Signed)
PHARMACY CONSULT NOTE - FOLLOW UP  Pharmacy Consult for Electrolyte Monitoring and Replacement   Recent Labs: Potassium (mmol/L)  Date Value  07/12/2021 3.1 (L)   Magnesium (mg/dL)  Date Value  07/12/2021 1.8   Calcium (mg/dL)  Date Value  07/12/2021 8.3 (L)   Albumin (g/dL)  Date Value  07/08/2021 3.0 (L)  07/08/2021 2.9 (L)   Phosphorus (mg/dL)  Date Value  07/12/2021 2.8   Sodium (mmol/L)  Date Value  07/12/2021 137     Assessment: 30YOM presenting with shortness of breath and sepsis. Currently intubated. Found to have MSSA bacteremia. PMH includes PE and MI.  K: 3.5>3.1 Mg: 2.2>1.9>1.8 Phos: 5.2>2.8  Goal of Therapy:  Electrolytes WNL  Plan:  K: 3.5>3.1 NPO; Replete with KCL IV 44mEq q1h x4 doses. Mg: 2.2>1.8  Downtrending ISO hypoK will opt to replete with MgSO4 2g IV x1. Other lytes WNL today, not req'ing repletion at this time Will CTM and replace PRN with AM labs.  Lorna Dibble, PharmD, [facility] Clinical Pharmacist 07/12/2021 9:35 AM

## 2021-07-12 NOTE — Progress Notes (Signed)
   Subjective: 1 Day Post-Op Procedure(s) (LRB): SYNOVIAL BIOPSY AND ARTHROSCOPIC IRRGATION AND DEBRIDEMENT OF SEPTIC KNEE (Right) SYNOVIAL BIOPSY (Right) Patient intubated.  Objective: Vital signs in last 24 hours: Temp:  [98.4 F (36.9 C)-99.6 F (37.6 C)] 98.4 F (36.9 C) (11/22 1945) Pulse Rate:  [69-111] 90 (11/23 1145) Resp:  [14-24] 16 (11/23 1145) BP: (85-148)/(34-74) 104/55 (11/23 1145) SpO2:  [87 %-100 %] 98 % (11/23 1145) FiO2 (%):  [35 %] 35 % (11/23 1145) Weight:  [145.1 kg] 145.1 kg (11/23 0500)  Intake/Output from previous day: 11/22 0701 - 11/23 0700 In: 1988.4 [P.O.:60; I.V.:1265.5; NG/GT:40; IV Piggyback:622.9] Out: 5638 [Urine:3575] Intake/Output this shift: Total I/O In: 361 [I.V.:308.7; IV Piggyback:52.3] Out: 135 [Urine:135]  Recent Labs    07/10/21 0528 07/11/21 0605 07/12/21 0635  HGB 9.0* 9.6* 8.3*   Recent Labs    07/11/21 0605 07/12/21 0635  WBC 8.9 7.7  RBC 3.44* 2.95*  HCT 32.6* 27.7*  PLT 191 191   Recent Labs    07/11/21 0605 07/12/21 0635  NA 137 137  K 3.5 3.1*  CL 89* 87*  CO2 38* 43*  BUN 9 7  CREATININE 0.60* 0.57*  GLUCOSE 191* 104*  CALCIUM 9.0 8.3*   No results for input(s): LABPT, INR in the last 72 hours.  EXAM General - Patient is sedated Right lower Extremity - hemovac intact with ace wrap. Dressing - dressing C/D/I and no drainage   Past Medical History:  Diagnosis Date   Gout    Hypertension    Rupture of bowel (HCC)     Assessment/Plan:   1 Day Post-Op Procedure(s) (LRB): SYNOVIAL BIOPSY AND ARTHROSCOPIC IRRGATION AND DEBRIDEMENT OF SEPTIC KNEE (Right) SYNOVIAL BIOPSY (Right) Principal Problem:   Acute on chronic respiratory failure with hypoxemia (HCC) Active Problems:   Colostomy status (HCC)   Pressure injury of skin  Estimated body mass index is 42.2 kg/m as calculated from the following:   Height as of this encounter: 6\' 1"  (1.854 m).   Weight as of this encounter: 145.1  kg.  Continue with IV abx, cultures pending  ID consult appreciated. Remove hemovac tomorrow  DVT Prophylaxis -  Heparin   T. Rachelle Hora, PA-C [place] 07/12/2021, 12:29 PM

## 2021-07-12 NOTE — Progress Notes (Signed)
Date of Admission:  07/08/2021     ID: Craig [name] is a 30 y.o. male  Principal Problem:   Acute on chronic respiratory failure with hypoxemia (HCC) Active Problems:   Colostomy status ([place])   Pressure injury of skin    Subjective: Feeling better Some pain rt knee Had TEE today  Medications:   vitamin C  500 mg Oral BID   chlorhexidine gluconate (MEDLINE KIT)  15 mL Mouth Rinse BID   Chlorhexidine Gluconate Cloth  6 each Topical Q0600   clonazePAM  1 mg Oral Daily   COVID-19 mRNA bivalent vaccine (Moderna)  0.5 mL Intramuscular Once   docusate  100 mg Per Tube BID   DULoxetine  60 mg Oral Daily   feeding supplement (NEPRO CARB STEADY)  237 mL Oral TID BM   folic acid  1 mg Per Tube Daily   furosemide  40 mg Intravenous Daily   heparin injection (subcutaneous)  5,000 Units Subcutaneous Q8H   insulin aspart  0-20 Units Subcutaneous Q4H   insulin glargine-yfgn  15 Units Subcutaneous BID   lidocaine  1 patch Transdermal Q24H   mouth rinse  15 mL Mouth Rinse 10 times per day   metoprolol succinate  100 mg Oral Daily   multivitamin with minerals  1 tablet Oral Daily   nicotine  21 mg Transdermal Daily   polyethylene glycol  17 g Per Tube Daily   predniSONE  10 mg Per Tube Q breakfast   pregabalin  200 mg Oral BID   zinc oxide   Topical Daily    Objective: Vital signs in last 24 hours: Temp:  [98.4 F (36.9 C)-99.6 F (37.6 C)] 98.4 F (36.9 C) (11/22 1945) Pulse Rate:  [73-111] 98 (11/23 0715) Resp:  [14-24] 14 (11/23 0715) BP: (85-148)/(34-74) 101/50 (11/23 0715) SpO2:  [87 %-100 %] 98 % (11/23 0715) FiO2 (%):  [35 %] 35 % (11/23 0752) Weight:  [145.1 kg] 145.1 kg (11/23 0500)  PHYSICAL EXAM:  General: Alert, cooperative, no distress, at rest  Head: Normocephalic, without obvious abnormality, atraumatic. Moon faced- malar erythema Eyes: Conjunctivae clear, anicteric sclerae. Pupils are equal ENT Nares normal. No drainage or sinus tenderness. Lips,  mucosa, and tongue normal. No Thrush Neck: Supple, symmetrical, no adenopathy, thyroid: non tender no carotid bruit and no JVD. Lungs: b/l air entry- decreased bases Heart: tachycardia Abdomen: Soft, lap scars Colostomy+ Foley  Extremities: rt leg- surgical dressing Skin: No rashes or lesions. Or bruising Lymph: Cervical, supraclavicular normal. Neurologic: Grossly non-focal  Lab Results Recent Labs    07/11/21 0605 07/12/21 0635  WBC 8.9 7.7  HGB 9.6* 8.3*  HCT 32.6* 27.7*  NA 137 137  K 3.5 3.1*  CL 89* 87*  CO2 38* 43*  BUN 9 7  CREATININE 0.60* 0.57*   Microbiology: [facility]- 11/19 3/4 bottles 07/09/21 BC- NG 07/11/21 synovial tissue/fluid culture Gram stain - gram positive cocci Studies/Results: DG Chest Port 1 View  Result Date: 07/11/2021 CLINICAL DATA:  Encounter for intubation Z01.818 (ICD-10-CM) EXAM: PORTABLE CHEST 1 VIEW COMPARISON:  July 08, 2021. FINDINGS: Endotracheal tube tip at the level of the clavicular heads. Similar enlargement of the cardiomediastinal silhouette, likely also accentuated by AP technique. Persistent airspace opacities bilaterally. No visible pleural effusions or pneumothorax on this semi erect radiograph. IMPRESSION: 1. Endotracheal tube in good position with tip at the level of the clavicular heads. 2. Persistent bilateral airspace opacities, unchanged. Electronically Signed   By: Jamesetta So.D.  On: 07/11/2021 17:24     Assessment/Plan:  MSSA bacteremia on nafcillin TEE no vegetation Repeat bc from 11/20 Neg so far  Rt knee septic arthritis- underwent arthroscopy yesterday- samples sent for culture  Acute hypoxic respiratory failure- was initially intubated - was extubated on 07/10/21  H/o long covid  Long covid hospitalization Dec 2021-feb 2022- had tracheostomy and peg- both removed-site closed  Inflammatory arthritis- on low d0se prednisone- was also on methotrexate before admission.agree with holding it  . cushingoid looking  DM- on insulin  Likely has OSA- need sleep study H/o MSSA bacteremia due to MSSA pneumonia in Jan 2022   Critical illness polyneuropathy on lyrica/cymbalta  H/O B12 def  H/O vitamin D def  Perforated diverticulitis 2094, complicated surgery has colostomy now  Morbid obesity   Discussed with patient and his wife

## 2021-07-12 NOTE — Progress Notes (Signed)
   Patient is currently intubated and sedated. Consent was obtained from his wife, at the bedside.   [facility] has been requested to perform a transesophageal echocardiogram on Craig [name] for possible endocarditis.  After careful review of history and examination, the risks and benefits of transesophageal echocardiogram have been explained including risks of esophageal damage, perforation (1:10,000 risk), bleeding, pharyngeal hematoma as well as other potential complications associated with conscious sedation including aspiration, arrhythmia, respiratory failure and death. Alternatives to treatment were discussed, questions were answered. [name] is willing to proceed.   Onesimo Lingard Ninfa Meeker, PA-C  07/12/2021 11:31 AM

## 2021-07-13 ENCOUNTER — Inpatient Hospital Stay: Payer: Medicaid Other

## 2021-07-13 LAB — BASIC METABOLIC PANEL
Anion gap: 11 (ref 5–15)
BUN: 7 mg/dL (ref 6–20)
CO2: 40 mmol/L — ABNORMAL HIGH (ref 22–32)
Calcium: 8.6 mg/dL — ABNORMAL LOW (ref 8.9–10.3)
Chloride: 89 mmol/L — ABNORMAL LOW (ref 98–111)
Creatinine, Ser: 0.48 mg/dL — ABNORMAL LOW (ref 0.61–1.24)
GFR, Estimated: >60 mL/min (ref >60)
Glucose, Bld: 155 mg/dL — ABNORMAL HIGH (ref 70–99)
Potassium: 4.2 mmol/L (ref 3.5–5.1)
Sodium: 140 mmol/L (ref 135–145)

## 2021-07-13 LAB — CBC
HCT: 31.4 % — ABNORMAL LOW (ref 39.0–52.0)
Hemoglobin: 9 g/dL — ABNORMAL LOW (ref 13.0–17.0)
MCH: 27.4 pg (ref 26.0–34.0)
MCHC: 28.7 g/dL — ABNORMAL LOW (ref 30.0–36.0)
MCV: 95.7 fL (ref 80.0–100.0)
Platelets: 235 10*3/uL (ref 150–400)
RBC: 3.28 MIL/uL — ABNORMAL LOW (ref 4.22–5.81)
RDW: 18.2 % — ABNORMAL HIGH (ref 11.5–15.5)
WBC: 8.8 10*3/uL (ref 4.0–10.5)
nRBC: 0 % (ref 0.0–0.2)

## 2021-07-13 LAB — GLUCOSE, CAPILLARY
Glucose-Capillary: 159 mg/dL — ABNORMAL HIGH (ref 70–99)
Glucose-Capillary: 120 mg/dL — ABNORMAL HIGH (ref 70–99)
Glucose-Capillary: 135 mg/dL — ABNORMAL HIGH (ref 70–99)
Glucose-Capillary: 270 mg/dL — ABNORMAL HIGH (ref 70–99)
Glucose-Capillary: 199 mg/dL — ABNORMAL HIGH (ref 70–99)

## 2021-07-13 LAB — MAGNESIUM: Magnesium: 1.7 mg/dL (ref 1.7–2.4)

## 2021-07-13 LAB — PHOSPHORUS: Phosphorus: 4.2 mg/dL (ref 2.5–4.6)

## 2021-07-13 IMAGING — DX DG CHEST 1V PORT
1 series · 1 of 1 positions shown · non-contrast
Comparison: [DATE] and prior studies

CLINICAL DATA: Shortness of breath.  Recent RIGHT knee surgery.

EXAM:
PORTABLE CHEST 1 VIEW

[chest ap]
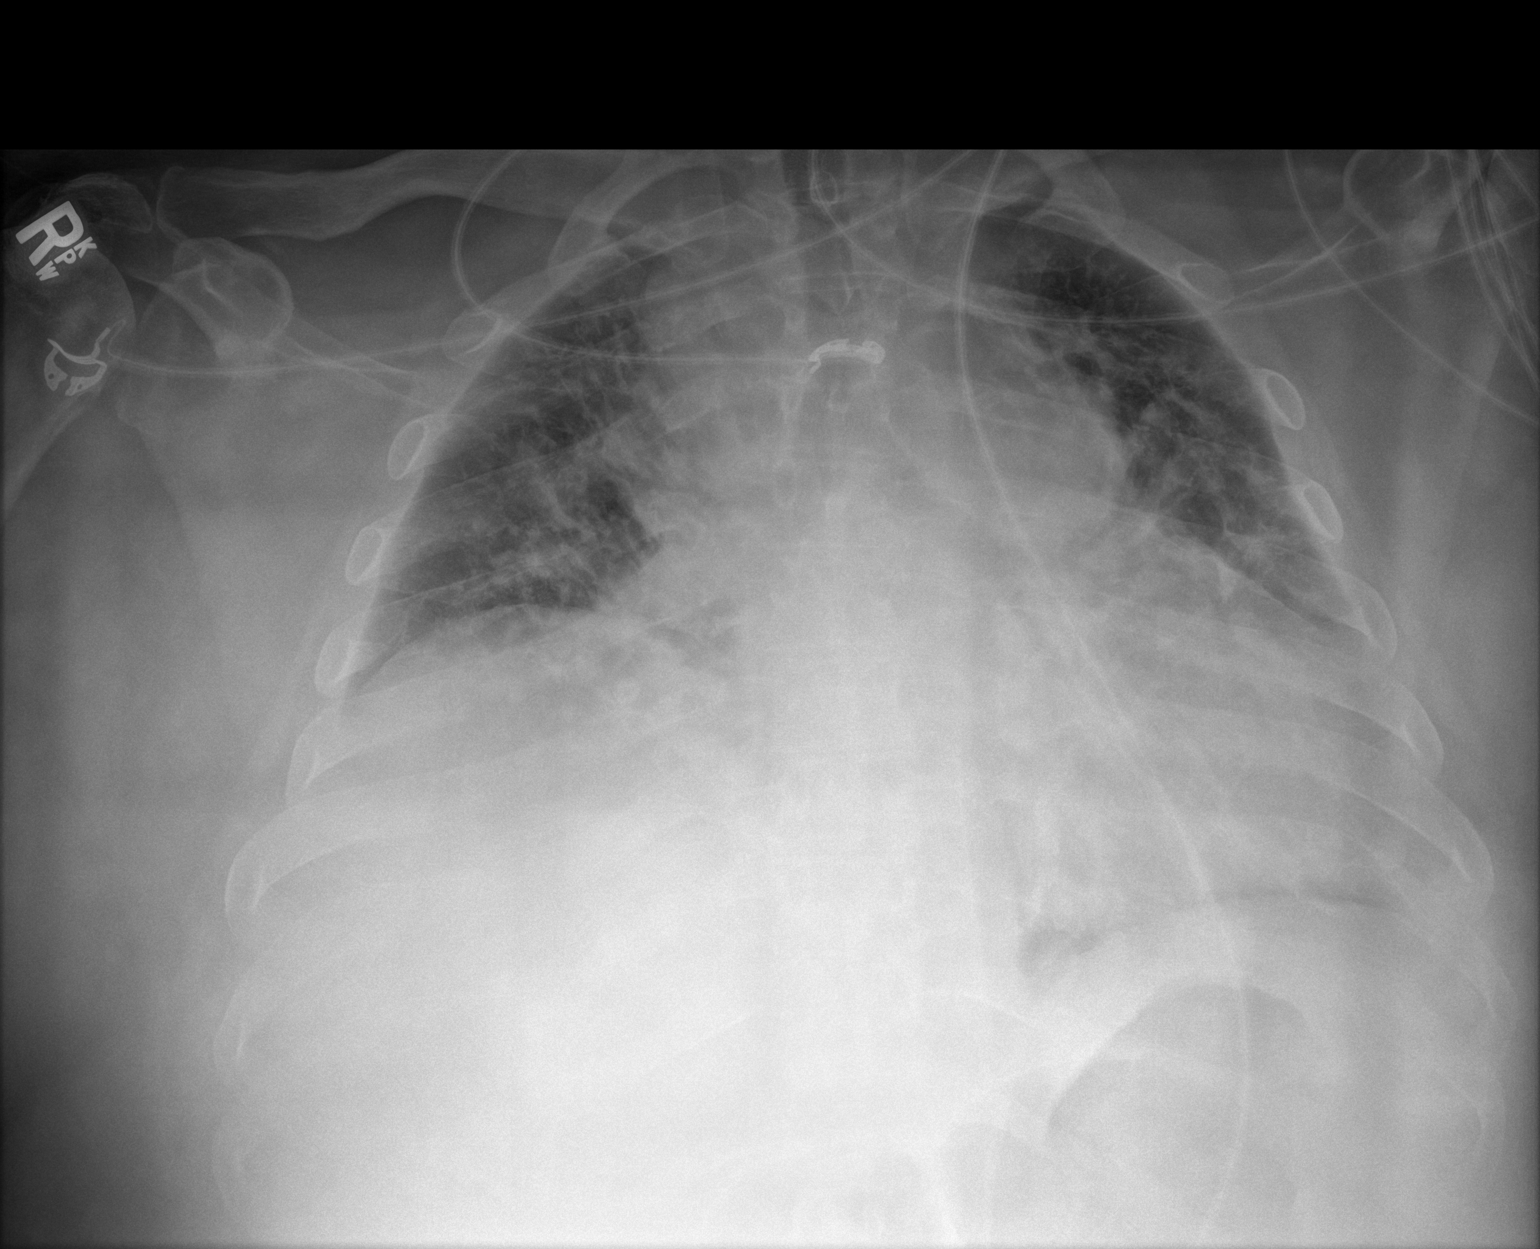

[1 of 1 positions shown; findings below may reference images not displayed]

FINDINGS: An endotracheal tube has been removed.

The cardiomediastinal silhouette is unchanged.

RIGHT hemidiaphragm elevation is again noted.

Bilateral airspace opacities are again noted with slightly increased
RIGHT basilar opacity/atelectasis.

There is no evidence of pneumothorax or large pleural effusion.
IMPRESSION: 1. Endotracheal tube removal with slightly increased RIGHT basilar
opacity/atelectasis.
2. Bilateral airspace opacities again noted.

## 2021-07-13 MED ORDER — MAGNESIUM SULFATE 2 GM/50ML IV SOLN
2.0000 g | Freq: Once | INTRAVENOUS | Status: AC
  Administered 2021-07-13: 2 g via INTRAVENOUS
  Filled 2021-07-13 (×2): qty 50

## 2021-07-13 MED ORDER — PREDNISONE 10 MG PO TABS
5.0000 mg | ORAL_TABLET | Freq: Every day | ORAL | Status: DC
Start: 2021-07-14 — End: 2021-07-16
  Administered 2021-07-14 – 2021-07-16 (×3): 5 mg
  Filled 2021-07-13 (×3): qty 1

## 2021-07-13 MED ORDER — DOCUSATE SODIUM 100 MG PO CAPS
100.0000 mg | ORAL_CAPSULE | Freq: Two times a day (BID) | ORAL | Status: DC
  Administered 2021-07-14 – 2021-07-19 (×10): 100 mg via ORAL
  Filled 2021-07-13 (×11): qty 1

## 2021-07-13 NOTE — Progress Notes (Signed)
Subjective: 2 Days Post-Op Procedure(s) (LRB): SYNOVIAL BIOPSY AND ARTHROSCOPIC IRRGATION AND DEBRIDEMENT OF SEPTIC KNEE (Right) SYNOVIAL BIOPSY (Right) Patient was intubated yesterday, removed last night. Reports mild pain in the right knee. Denies any N/V this morning. Was able to eat well following extubation. ACE wrap intact and hemovac intact to the right knee.  Objective: Vital signs in last 24 hours: Temp:  [98.3 F (36.8 C)-98.7 F (37.1 C)] 98.3 F (36.8 C) (11/23 2000) Pulse Rate:  [69-127] 103 (11/24 0600) Resp:  [14-48] 24 (11/24 0600) BP: (90-127)/(42-73) 106/64 (11/24 0600) SpO2:  [93 %-100 %] 94 % (11/24 0600) FiO2 (%):  [35 %] 35 % (11/23 1145) Weight:  [145.1 kg] 145.1 kg (11/24 0500)  Intake/Output from previous day: 11/23 0701 - 11/24 0700 In: 2124.9 [P.O.:720; I.V.:467.9; IV Piggyback:937] Out: 9381 [Urine:3175] Intake/Output this shift: No intake/output data recorded.  Recent Labs    07/11/21 0605 07/12/21 0635 07/13/21 0546  HGB 9.6* 8.3* 9.0*   Recent Labs    07/12/21 0635 07/13/21 0546  WBC 7.7 8.8  RBC 2.95* 3.28*  HCT 27.7* 31.4*  PLT 191 235   Recent Labs    07/12/21 0635 07/13/21 0409  NA 137 140  K 3.1* 4.2  CL 87* 89*  CO2 43* 40*  BUN 7 7  CREATININE 0.57* 0.48*  GLUCOSE 104* 155*  CALCIUM 8.3* 8.6*   No results for input(s): LABPT, INR in the last 72 hours.  EXAM General - Patient is well, oriented and appropriate. Right lower Extremity - ACE wrap intact to the right leg. Hemovac with mild bloody drainage, hemovac was removed without complications. Moderate lymphadenopathy to bilateral legs. He is able to dorsiflex and plantarflex the foot. Intact to light touch to bilateral legs.  Past Medical History:  Diagnosis Date   Gout    Hypertension    Rupture of bowel (HCC)     Assessment/Plan:   2 Days Post-Op Procedure(s) (LRB): SYNOVIAL BIOPSY AND ARTHROSCOPIC IRRGATION AND DEBRIDEMENT OF SEPTIC KNEE  (Right) SYNOVIAL BIOPSY (Right) Principal Problem:   Acute on chronic respiratory failure with hypoxemia (HCC) Active Problems:   Colostomy status (HCC)   Pressure injury of skin  Estimated body mass index is 42.2 kg/m as calculated from the following:   Height as of this encounter: 6\' 1"  (1.854 m).   Weight as of this encounter: 145.1 kg.   Cultures pending at this time.  Currently on Nafcillin. Gram stain with rare WBC, no organisms grown yet.  No growth on Culture so far. Hemovac was removed today.  ACE wrap and bulky dressing intact. Will plan on removing ACE wrap tomorrow. PT order will be placed for patient to start working with PT while admitted.  DVT Prophylaxis -  Heparin  Raquel Izzy [place], PA-C [place] 07/13/2021, 8:10 AM
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
## 2021-07-13 NOTE — Progress Notes (Signed)
PHARMACY CONSULT NOTE - FOLLOW UP  Pharmacy Consult for Electrolyte Monitoring and Replacement   Recent Labs: Potassium (mmol/L)  Date Value  07/13/2021 4.2   Magnesium (mg/dL)  Date Value  07/13/2021 1.7   Calcium (mg/dL)  Date Value  07/13/2021 8.6 (L)   Albumin (g/dL)  Date Value  07/12/2021 2.6 (L)   Phosphorus (mg/dL)  Date Value  07/13/2021 4.2   Sodium (mmol/L)  Date Value  07/13/2021 140     Assessment: 30YOM presenting with shortness of breath and sepsis. Currently intubated. Found to have MSSA bacteremia. PMH includes PE and MI.  Goal of Therapy:  Electrolytes WNL  Plan:  Will give Mg 2 g IV x1.  F/u with AM labs.    Eleonore Chiquito, PharmD, BCPS Clinical Pharmacist 07/13/2021 10:35 AM

## 2021-07-14 DIAGNOSIS — J9621 Acute and chronic respiratory failure with hypoxia: Secondary | ICD-10-CM | POA: Diagnosis not present

## 2021-07-14 LAB — CBC
HCT: 30.7 % — ABNORMAL LOW (ref 39.0–52.0)
Hemoglobin: 8.7 g/dL — ABNORMAL LOW (ref 13.0–17.0)
MCH: 27.6 pg (ref 26.0–34.0)
MCHC: 28.3 g/dL — ABNORMAL LOW (ref 30.0–36.0)
MCV: 97.5 fL (ref 80.0–100.0)
Platelets: 254 10*3/uL (ref 150–400)
RBC: 3.15 MIL/uL — ABNORMAL LOW (ref 4.22–5.81)
RDW: 18.1 % — ABNORMAL HIGH (ref 11.5–15.5)
WBC: 9.3 10*3/uL (ref 4.0–10.5)
nRBC: 0 % (ref 0.0–0.2)

## 2021-07-14 LAB — GLUCOSE, CAPILLARY
Glucose-Capillary: 124 mg/dL — ABNORMAL HIGH (ref 70–99)
Glucose-Capillary: 127 mg/dL — ABNORMAL HIGH (ref 70–99)
Glucose-Capillary: 138 mg/dL — ABNORMAL HIGH (ref 70–99)
Glucose-Capillary: 174 mg/dL — ABNORMAL HIGH (ref 70–99)
Glucose-Capillary: 194 mg/dL — ABNORMAL HIGH (ref 70–99)
Glucose-Capillary: 234 mg/dL — ABNORMAL HIGH (ref 70–99)

## 2021-07-14 LAB — BASIC METABOLIC PANEL
Anion gap: 8 (ref 5–15)
BUN: 6 mg/dL (ref 6–20)
CO2: 45 mmol/L — ABNORMAL HIGH (ref 22–32)
Calcium: 8.6 mg/dL — ABNORMAL LOW (ref 8.9–10.3)
Chloride: 85 mmol/L — ABNORMAL LOW (ref 98–111)
Creatinine, Ser: 0.34 mg/dL — ABNORMAL LOW (ref 0.61–1.24)
GFR, Estimated: >60 mL/min (ref >60)
Glucose, Bld: 155 mg/dL — ABNORMAL HIGH (ref 70–99)
Potassium: 3.2 mmol/L — ABNORMAL LOW (ref 3.5–5.1)
Sodium: 138 mmol/L (ref 135–145)

## 2021-07-14 LAB — CULTURE, BLOOD (ROUTINE X 2)
Culture: NO GROWTH
Culture: NO GROWTH
Special Requests: ADEQUATE

## 2021-07-14 LAB — ACID FAST SMEAR (AFB, MYCOBACTERIA)
Acid Fast Smear: NEGATIVE
Acid Fast Smear: NEGATIVE

## 2021-07-14 LAB — PHOSPHORUS: Phosphorus: 2.9 mg/dL (ref 2.5–4.6)

## 2021-07-14 LAB — MAGNESIUM: Magnesium: 2.1 mg/dL (ref 1.7–2.4)

## 2021-07-14 MED ORDER — FUROSEMIDE 10 MG/ML IJ SOLN: 40.0000 mg | Freq: Two times a day (BID) | INTRAMUSCULAR | Status: DC

## 2021-07-14 MED ORDER — INSULIN ASPART 100 UNIT/ML IJ SOLN
0.0000 [IU] | Freq: Three times a day (TID) | INTRAMUSCULAR | Status: DC
  Administered 2021-07-15: 13:00:00 11 [IU] via SUBCUTANEOUS
  Administered 2021-07-15: 7 [IU] via SUBCUTANEOUS
  Administered 2021-07-15 – 2021-07-16 (×2): 4 [IU] via SUBCUTANEOUS
  Administered 2021-07-16 (×2): 7 [IU] via SUBCUTANEOUS
  Administered 2021-07-17: 13:00:00 11 [IU] via SUBCUTANEOUS
  Administered 2021-07-17 (×2): 4 [IU] via SUBCUTANEOUS
  Administered 2021-07-18: 7 [IU] via SUBCUTANEOUS
  Administered 2021-07-18: 15 [IU] via SUBCUTANEOUS
  Administered 2021-07-19: 11 [IU] via SUBCUTANEOUS
  Administered 2021-07-19: 20 [IU] via SUBCUTANEOUS
  Administered 2021-07-19 – 2021-07-20 (×4): 11 [IU] via SUBCUTANEOUS
  Administered 2021-07-21 (×2): 4 [IU] via SUBCUTANEOUS
  Administered 2021-07-21: 7 [IU] via SUBCUTANEOUS
  Administered 2021-07-22: 11 [IU] via SUBCUTANEOUS
  Administered 2021-07-22: 4 [IU] via SUBCUTANEOUS
  Administered 2021-07-22: 15 [IU] via SUBCUTANEOUS
  Administered 2021-07-23 – 2021-07-25 (×5): 4 [IU] via SUBCUTANEOUS
  Administered 2021-07-25: 3 [IU] via SUBCUTANEOUS
  Administered 2021-07-25: 11 [IU] via SUBCUTANEOUS
  Filled 2021-07-14 (×31): qty 1

## 2021-07-14 MED ORDER — CYCLOBENZAPRINE HCL 10 MG PO TABS
10.0000 mg | ORAL_TABLET | Freq: Three times a day (TID) | ORAL | Status: DC
  Administered 2021-07-14 – 2021-07-15 (×3): 10 mg via ORAL
  Filled 2021-07-14 (×4): qty 1

## 2021-07-14 MED ORDER — SODIUM CHLORIDE 0.9% FLUSH
10.0000 mL | Freq: Two times a day (BID) | INTRAVENOUS | Status: DC
  Administered 2021-07-15 – 2021-07-25 (×11): 10 mL

## 2021-07-14 MED ORDER — POTASSIUM CHLORIDE CRYS ER 20 MEQ PO TBCR
40.0000 meq | EXTENDED_RELEASE_TABLET | ORAL | Status: AC
  Administered 2021-07-14 (×2): 40 meq via ORAL
  Filled 2021-07-14 (×2): qty 2

## 2021-07-14 MED ORDER — OXYCODONE HCL 5 MG PO TABS
10.0000 mg | ORAL_TABLET | ORAL | Status: DC | PRN
Start: 2021-07-14 — End: 2021-07-26
  Administered 2021-07-15 – 2021-07-18 (×8): 10 mg via ORAL
  Administered 2021-07-19 (×3): 15 mg via ORAL
  Administered 2021-07-20: 10 mg via ORAL
  Administered 2021-07-20: 15 mg via ORAL
  Administered 2021-07-20 – 2021-07-25 (×6): 10 mg via ORAL
  Filled 2021-07-14 (×2): qty 2
  Filled 2021-07-14: qty 3
  Filled 2021-07-14 (×9): qty 2
  Filled 2021-07-14 (×3): qty 3
  Filled 2021-07-14 (×5): qty 2

## 2021-07-14 MED ORDER — ENOXAPARIN SODIUM 80 MG/0.8ML IJ SOSY
0.5000 mg/kg | PREFILLED_SYRINGE | INTRAMUSCULAR | Status: DC
  Administered 2021-07-14 – 2021-07-18 (×5): 72.5 mg via SUBCUTANEOUS
  Filled 2021-07-14 (×6): qty 0.72

## 2021-07-14 MED ORDER — OXYCODONE HCL 5 MG PO TABS
5.0000 mg | ORAL_TABLET | ORAL | Status: DC | PRN
  Administered 2021-07-14 (×2): 5 mg via ORAL
  Filled 2021-07-14 (×2): qty 1

## 2021-07-14 MED ORDER — SODIUM CHLORIDE 0.9% FLUSH: 10.0000 mL | INTRAVENOUS | Status: DC | PRN

## 2021-07-14 MED ORDER — MORPHINE SULFATE (PF) 4 MG/ML IV SOLN: 4.0000 mg | Freq: Three times a day (TID) | INTRAVENOUS | Status: DC | PRN

## 2021-07-14 MED ORDER — ENOXAPARIN SODIUM 40 MG/0.4ML IJ SOSY: 40.0000 mg | PREFILLED_SYRINGE | INTRAMUSCULAR | Status: DC

## 2021-07-14 NOTE — Progress Notes (Addendum)
NAME:  Craig [name], MRN:  [id number], DOB:  Apr 06, 1991, LOS: 6 ADMISSION DATE:  07/08/2021, CONSULTATION DATE:  07/08/2021 REFERRING MD:  Merlyn Lot MD CHIEF COMPLAINT:  Altered Mental Status    HPI  30 y.o. male with significant PMH of extensive medical history including inflammatory arthritis on methotrexate and chronic prednisone at 10 mg daily, prior severe Covid-19 infection requiring prolonged hospitalization and tracheostomy (since decannulated) Dec 2021 - Feb 2022, chronic hypoxic and hypercarbic respiratory failure on 2L O2 PRN, MSSA bacteremia, MSSA pneumonia, perforated diverticulitis s/p partial colectomy with ostomy creation, morbid obesity and gout who presented to the ED with chief complaints of progressive shortness of breath. Patient is currently intubated, history mostly obtained from patient's chart and patient's wife who is currently at the bedside.  Per patient's wife, EMS was called today due to worsening shortness of breath and intermittent altered mental status.  [name] stated the patient has been complaining of right knee pain was prescribed Percocet on Monday.  She took the Percocet at Monday and noticed worsening shortness of breath with mental status change per wife.  He also takes tramadol for pain.  No reports of chest pain, nausea vomiting, abdominal pain, diaphoresis, fevers or chills, cough or any other focal neurological symptoms or deficit.  On EMS arrival patient was noted to be hypoxic with a heart rate between 140 to 150 bpm.  He was placed on 6 L to maintain SPO2 greater than 90% and transported to the ED. PCCM consulted for admission and further management.  On exam in the ED,  patient was noted to have seizure-like activity lasting less than 60 seconds.  Patient noted with facial twitching and jerking movement of the torso with increased heart rate in the 150s beats per minute.  Patient received 2 mg of IV Ativan.  07/10/21- patient is s/p  liberation from MV. Wife Tammy at bedside.  Patient is confused with septic encephalopathy.  07/11/21- patient is for R knee aspiration for septic arthritis workup and has plan for TEE per ID in lieu of staph aureus bacteremia.  07/13/21- Patient is improved, hes speaking in full sentences in no distress.  Knee cultures = Staph Aureus + septic arthitis. S/p TEE - normal . Patient stable optimizing for TRH transfer to medical floor.  07/14/21- PCCM will sign off patient stable for floor   Past Medical History  Inflammatory arthritis on methotrexate and chronic prednisone at 10 mg daily Prior severe Covid-19 infection requiring prolonged hospitalization Dec 2021 to Feb 2022 and tracheostomy (since decannulated) Chronic hypoxic and hypercarbic respiratory failure on 2L O2 PRN MSSA bacteremia MSSA pneumonia Perforated diverticulitis s/p partial colectomy with ostomy creation Morbid obesity Gout  Significant Hospital Events   11/19: Admitted with acute on chronic hypoxic hypercapnic respiratory failure 11/21: extubated, on high flow O2  Consults:  Neurology Infectious Disease  Procedures:  None  Significant Diagnostic Tests:  11/19: Chest Xray>Mild irregular opacities bilaterally which may be residual/chronic 11/19: Noncontrast CT head>No acute intracranial abnormality 11/19: CTA abdomen and pelvis>No acute process identified in the abdomen or pelvis. 2. Hepatomegaly and steatosis. 11/19: CTA Chest>No pulmonary embolism identified. Somewhat limited evaluation due to motion. 2. No acute consolidations/infiltrate identified in the lungs. Similar appearance and distribution of irregular and consolidative densities bilaterally.   Micro Data:  11/19: SARS-CoV-2 PCR> negative 11/19: Influenza PCR> negative 11/19: Blood culture x2>+MSSA 11/19: Urine Culture> 11/19: MRSA PCR>neg 11/19: Strep pneumo urinary antigen> 11/19: Legionella urinary antigen>  Antimicrobials:  Azithromycin  11/19 Vancomycin 11/19 Cefepime 11/19>> Ancef 11/20>>  Subjective:  Patient extubated this AM, some issues with desaturation/anxiety, but controlled with high flow O2 and 50 mg benadryl for anxiety.   OBJECTIVE  Blood pressure (!) 155/79, pulse (!) 111, temperature 97.7 F (36.5 C), temperature source Oral, resp. rate 19, height 6' 1" (1.854 m), weight (!) 145.1 kg, SpO2 100 %.        Intake/Output Summary (Last 24 hours) at 07/14/2021 1132 Last data filed at 07/14/2021 6226 Gross per 24 hour  Intake 769.71 ml  Output 6495 ml  Net -5725.29 ml    Filed Weights   07/11/21 0500 07/12/21 0500 07/13/21 0500  Weight: (!) 144.9 kg (!) 145.1 kg (!) 145.1 kg   Physical Examination  GENERAL:  obese Caucasian male, recently extubated on high flow O2. Confused, A&Ox0. Nonsensical speech.  EYES: Pupils 2 mm, equal and round LUNGS: CTA b/l, no W/C/R CARDIOVASCULAR: RRR, no MRG  ABDOMEN: Obese, soft, nondistended. Bowel sounds present. +ostomy L side EXTREMITIES: 4+ pitting of BLE NEUROLOGIC: moves all extremities, not following commands. Nonsensical speech.  SKIN: multiple skin breakdown as below in sacral, groin and abdominal area       Labs/imaging that I havepersonally reviewed  (right click and "Reselect all SmartList Selections" daily)     Labs   CBC: Recent Labs  Lab 07/08/21 0821 07/09/21 0129 07/10/21 0528 07/11/21 0605 07/12/21 0635 07/13/21 0546 07/14/21 0702  WBC 9.8   < > 8.2 8.9 7.7 8.8 9.3  NEUTROABS 7.4  --   --   --   --   --   --   HGB 9.9*   < > 9.0* 9.6* 8.3* 9.0* 8.7*  HCT 33.6*   < > 30.0* 32.6* 27.7* 31.4* 30.7*  MCV 96.0   < > 92.9 94.8 93.9 95.7 97.5  PLT 201   < > 194 191 191 235 254   < > = values in this interval not displayed.     Basic Metabolic Panel: Recent Labs  Lab 07/10/21 0528 07/11/21 0605 07/12/21 0635 07/13/21 0409 07/14/21 0702  NA 138 137 137 140 138  K 3.5 3.5 3.1* 4.2 3.2*  CL 91* 89* 87* 89* 85*  CO2 40* 38* 43*  40* 45*  GLUCOSE 263* 191* 104* 155* 155*  BUN _0 CREATININE 0.52* 0.60* 0.57* 0.48* 0.34*  CALCIUM 8.6* 9.0 8.3* 8.6* 8.6*  MG 2.2 1.9 1.8 1.7 2.1  PHOS 3.5 5.2* 2.8 4.2 2.9    GFR: Estimated Creatinine Clearance: 198.8 mL/min (A) (by C-G formula based on SCr of 0.34 mg/dL (L)). Recent Labs  Lab 07/08/21 0821 07/08/21 1203 07/08/21 1448 07/09/21 0129 07/10/21 0528 07/11/21 0605 07/12/21 0635 07/13/21 0546 07/14/21 0702  PROCALCITON  --   --  0.33 1.18  --   --   --   --   --   WBC 9.8  --   --  9.9   < > 8.9 7.7 8.8 9.3  LATICACIDVEN 3.5* 1.9  --   --   --   --   --   --   --    < > = values in this interval not displayed.     Liver Function Tests: Recent Labs  Lab 07/08/21 0821 07/12/21 0626  AST 30 15  ALT 44 24  ALKPHOS 56 42  BILITOT 1.2 0.9  PROT 6.4* 5.5*  ALBUMIN 2.9*  3.0* 2.6*    No results for input(s): LIPASE,  AMYLASE in the last 168 hours. No results for input(s): AMMONIA in the last 168 hours.  ABG    Component Value Date/Time   PHART 7.45 07/11/2021 1707   PCO2ART 66 (HH) 07/11/2021 1707   PO2ART 72 (L) 07/11/2021 1707   HCO3 45.9 (H) 07/11/2021 1707   O2SAT 95.0 07/11/2021 1707      Coagulation Profile: Recent Labs  Lab 07/08/21 1829  INR 1.0     Cardiac Enzymes: No results for input(s): CKTOTAL, CKMB, CKMBINDEX, TROPONINI in the last 168 hours.  HbA1C: Hgb A1c MFr Bld  Date/Time Value Ref Range Status  07/08/2021 06:29 PM 6.8 (H) 4.8 - 5.6 % Final    Comment:    (NOTE) Pre diabetes:          5.7%-6.4%  Diabetes:              >6.4%  Glycemic control for   <7.0% adults with diabetes   08/10/2020 04:33 AM 5.7 (H) 4.8 - 5.6 % Final    Comment:    (NOTE) Pre diabetes:          5.7%-6.4%  Diabetes:              >6.4%  Glycemic control for   <7.0% adults with diabetes     CBG: Recent Labs  Lab 07/13/21 1954 07/14/21 0010 07/14/21 0436 07/14/21 0718 07/14/21 1125  GLUCAP 199* 138* 127* 124* 194*      Review of Systems:   UNABLE TO OBTAIN DUE TO PATIENT AMS  Past Medical History  He,  has a past medical history of Gout, Hypertension, and Rupture of bowel ([place]).   Surgical History    Past Surgical History:  Procedure Laterality Date   COLONOSCOPY WITH PROPOFOL N/A 05/18/2020   Procedure: COLONOSCOPY WITH PROPOFOL;  Surgeon: Benjamine Sprague, DO;  Location: [facility];  Service: General;  Laterality: N/A;   COLOSTOMY N/A 11/22/2019   Procedure: COLOSTOMY;  Surgeon: Herbert Pun, MD;  Location: [facility];  Service: General;  Laterality: N/A;   KNEE ARTHROSCOPY Right 07/11/2021   Procedure: SYNOVIAL BIOPSY AND ARTHROSCOPIC IRRGATION AND DEBRIDEMENT OF SEPTIC KNEE;  Surgeon: Hessie Knows, MD;  Location: [facility];  Service: Orthopedics;  Laterality: Right;   LAPAROTOMY N/A 11/22/2019   Procedure: EXPLORATORY LAPAROTOMY;  Surgeon: Herbert Pun, MD;  Location: [facility];  Service: General;  Laterality: N/A;   LAPAROTOMY N/A 07/08/2020   Procedure: EXPLORATORY LAPAROTOMY;  Surgeon: Herbert Pun, MD;  Location: [facility];  Service: General;  Laterality: N/A;   PARTIAL COLECTOMY N/A 11/22/2019   Procedure: PARTIAL COLECTOMY;  Surgeon: Herbert Pun, MD;  Location: [facility];  Service: General;  Laterality: N/A;   PEG PLACEMENT N/A 09/14/2020   Procedure: PERCUTANEOUS ENDOSCOPIC GASTROSTOMY (PEG) PLACEMENT;  Surgeon: Benjamine Sprague, DO;  Location: [facility];  Service: General;  Laterality: N/A;  TRAVEL CASE [place] Right 07/11/2021   Procedure: SYNOVIAL BIOPSY;  Surgeon: Hessie Knows, MD;  Location: [facility];  Service: Orthopedics;  Laterality: Right;   TRACHEOSTOMY TUBE PLACEMENT N/A 09/13/2020   Procedure: TRACHEOSTOMY;  Surgeon: Beverly Gust, MD;  Location: [facility];  Service: ENT;  Laterality: N/A;   XI ROBOTIC ASSISTED COLOSTOMY TAKEDOWN N/A 06/28/2020   Procedure: XI ROBOTIC ASSISTED COLOSTOMY TAKEDOWN CONVERTED TO OPEN  PROCEDURE;  Surgeon: Herbert Pun, MD;  Location: [facility];  Service: General;  Laterality: N/A;     Social History   reports that he has never smoked. His smokeless tobacco use  includes chew. He reports current alcohol use. He reports that he does not use drugs.   Family History   His family history includes Healthy in his father and mother.   Allergies Allergies  Allergen Reactions   Amoxicillin Rash    Tolerated cefepime and cefazolin 08/2020.     Home Medications  Prior to Admission medications   Medication Sig Start Date End Date Taking? Authorizing Provider  acetaminophen (TYLENOL) 650 MG CR tablet Take by mouth. 10/14/20  Yes [provider]  albuterol (ACCUNEB) 1.25 MG/3ML nebulizer solution Take by nebulization. 03/07/21  Yes [provider]  allopurinol (ZYLOPRIM) 300 MG tablet Take 300 mg by mouth daily. 02/09/21  Yes [provider]  budesonide (PULMICORT) 0.25 MG/2ML nebulizer solution Inhale into the lungs. 03/07/21 03/07/22 Yes [provider]  Cholecalciferol (VITAMIN D3) 25 MCG (1000 UT) CAPS Take by mouth.   Yes [provider]  Cyanocobalamin (VITAMIN B-12 IJ) Inject as directed.   Yes [provider]  cyanocobalamin 1000 MCG tablet Take 1,000 mcg by mouth daily.   Yes [provider]  DULoxetine (CYMBALTA) 60 MG capsule Take by mouth. 11/14/20 11/14/21 Yes [provider]  famotidine (PEPCID) 20 MG tablet Take by mouth. 10/14/20  Yes [provider]  folic acid (FOLVITE) 1 MG tablet Take by mouth. 03/01/21 03/01/22 Yes [provider]  furosemide (LASIX) 20 MG tablet Take 40 mg by mouth daily. Taking 2 tablets once daily 03/07/21  Yes [provider]  ibuprofen (ADVIL) 600 MG tablet Take 600 mg by mouth every 6 (six) hours as needed.   Yes [provider]  lidocaine (LIDODERM) 5 % 1 patch daily as needed. 07/07/21  Yes [provider]  lisinopril  (ZESTRIL) 5 MG tablet Take by mouth. 11/07/20  Yes [provider]  metoprolol succinate (TOPROL-XL) 100 MG 24 hr tablet Take by mouth. 01/05/21 01/05/22 Yes [provider]  Multiple Vitamins-Minerals (MULTIVITAMIN WITH MINERALS) tablet Take 1 tablet by mouth daily.   Yes [provider]  Omega-3 Fatty Acids (FISH OIL) 1000 MG CAPS Take by mouth.   Yes [provider]  ondansetron (ZOFRAN) 4 MG tablet Take by mouth. 10/14/20  Yes [provider]  potassium chloride (KLOR-CON) 10 MEQ tablet Take 10 mEq by mouth daily. 04/27/21  Yes [provider]  predniSONE (DELTASONE) 20 MG tablet Take 20 mg by mouth daily. 03/01/21  Yes [provider]  pregabalin (LYRICA) 200 MG capsule Take by mouth. 03/01/21 03/01/22 Yes [provider]  rivaroxaban (XARELTO) 20 MG TABS tablet Take by mouth. 11/07/20  Yes [provider]  traMADol (ULTRAM) 50 MG tablet Take by mouth. 03/16/21  Yes [provider]  cetirizine (ZYRTEC) 10 MG tablet Take 10 mg by mouth daily. Patient not taking: Reported on 07/08/2021    [provider]  clonazePAM (KLONOPIN) 1 MG tablet Take by mouth. Patient not taking: Reported on 07/08/2021 10/14/20   [provider]  [employer] SALINE NASAL SPRAY 0.65 % nasal spray SMARTSIG:1 Spray(s) Both Nares Every 4 Hours PRN 10/14/20   [provider]  doxazosin (CARDURA) 1 MG tablet Take by mouth. Patient not taking: Reported on 07/08/2021 11/07/20   [provider]  doxycycline (VIBRAMYCIN) 100 MG capsule Take 100 mg by mouth 2 (two) times daily. 01/06/21   [provider]  fluticasone-salmeterol (ADVAIR) 100-50 MCG/ACT AEPB Inhale into the lungs. Patient not taking: Reported on 03/08/2021 10/27/20 10/27/21  [provider]  HYDROcodone-acetaminophen (NORCO/VICODIN)  5-325 MG tablet Take 1 tablet by mouth every 6 (six) hours as needed. Patient not taking: Reported on 03/27/2021  10/17/20   [provider]  melatonin 3 MG TABS tablet Take 6 mg by mouth at bedtime as needed. Patient not taking: Reported on 07/08/2021 10/14/20   [provider]  methotrexate (RHEUMATREX) 2.5 MG tablet Take by mouth. 03/01/21   [provider]  Home     [provider]  OXYGEN Inhale into the lungs. 1.5 L with sleep    [provider]  predniSONE (DELTASONE) 5 MG tablet 6 pills x1day, 5x1,4x1,3x1,2x1,1x1 Patient not taking: Reported on 03/08/2021 02/06/21   [provider]  Scheduled Meds:  vitamin C  500 mg Oral BID   chlorhexidine gluconate (MEDLINE KIT)  15 mL Mouth Rinse BID   Chlorhexidine Gluconate Cloth  6 each Topical Q0600   clonazePAM  1 mg Oral Daily   COVID-19 mRNA bivalent vaccine (Moderna)  0.5 mL Intramuscular Once   docusate sodium  100 mg Oral BID   DULoxetine  60 mg Oral Daily   feeding supplement (NEPRO CARB STEADY)  237 mL Oral TID BM   folic acid  1 mg Per Tube Daily   furosemide  40 mg Intravenous Daily   heparin injection (subcutaneous)  5,000 Units Subcutaneous Q8H   insulin aspart  0-20 Units Subcutaneous Q4H   insulin glargine-yfgn  15 Units Subcutaneous BID   lidocaine  1 patch Transdermal Q24H   metoprolol succinate  100 mg Oral Daily   multivitamin with minerals  1 tablet Oral Daily   nicotine  21 mg Transdermal Daily   polyethylene glycol  17 g Per Tube Daily   potassium chloride  40 mEq Oral Q4H   predniSONE  5 mg Per Tube Q breakfast   pregabalin  200 mg Oral BID   zinc oxide   Topical Daily   Continuous Infusions:  sodium chloride Stopped (07/11/21 1454)   sodium chloride Stopped (07/11/21 1653)   famotidine (PEPCID) IV 20 mg (07/14/21 0903)   nafcillin (NAFCIL) continuous infusion Stopped (07/14/21 0501)   PRN Meds:.sodium chloride, docusate sodium, gadobutrol, levalbuterol, midazolam, oxyCODONE, polyethylene glycol   Active Hospital Problem list   Acute hypoxic  hypercapnic respiratory failure Pneumonia Metabolic encephalopathy Seizure-like activity  Assessment & Plan:  Acute on chronic hypoxic and hypercarbic respiratory failure- RESOLVED PMHx: Covid-19 pneumonia, MSSA pneumonia, Morbid obesity, probable OHS/OSA - Extubated 11/23 - maintain SpO2 > 90% -Intermittent chest x-ray & ABG PRN -Ensure adequate pulmonary hygiene  -Bronchodilators PRN  Sepsis 2/2 MSSA bacteremia in immunocompromised patient Possible CAP vs. Wound infection  Lactic acidosis, resolved -Infectious Disease consulted -Continue antibiotics as per ID Dr Ramon Dredge -s/p TEE - no veg -Repeat blood cultures 72h to clear; follow up trach aspirate -Monitor WBC/ fever curve -Strict I/O's  Shock -RESOLVED  - Favor sedation/analgesia related hypotension rather than septic. -Pressors for MAP goal >65 -Continue steroids per home (for rheumatoid arthritis); no indication for stress dose steroids at this time -Wean pressors as able -Minimize sedation -Abx as above -Stop fluids given gross anasarca  Acute toxic metabolic encephalopathy likely multifactorial in the setting of sepsis and possible seizure like activity-RESOLVED  -CT Head wnl -Supportive care -Neurology consult; appreciate input   Seizure Like Activity- RESOLVED -CT Head wnl -Loaded with Keppra 1500 mg x 1 -Stop Keppra; monitor for recurrence of seizure activity -Sedation as above weaning as able -Seizure precautions  H/o upper extremity DVT It appears  that he was started on Xarelto before around Feb/Mar 2022 for provoked upper extremity DVT. He has completed an appropriate course of anticoagulation at this time and therapeutic anticoagulation will be discontinued. CTA chest did not reveal PE on admission. -Stop heparin gtt -Start SQH for VTE ppx  Electrolytes -Monitor I&O's / urinary output -Follow BMP -Replace electrolytes as indicated   DM2 -CBGs -Resistant Sliding scale insulin while on  steroids  -Basal insulin for CBG goal <200 -Follow ICU hyper/hypoglycemia protocol  *restart home meds   Best practice:  Diet:  Tube Feed  Pain/Anxiety/Delirium protocol (if indicated): No VAP protocol (if indicated): Not indicated DVT prophylaxis: Subcutaneous Heparin GI prophylaxis: H2B Glucose control:  SSI Yes Central venous access:  N/A Arterial line:  N/A Foley:  Yes, and it is still needed Mobility:  bed rest  PT consulted: N/A Last date of multidisciplinary goals of care discussion [11/19] Code Status:  full code Disposition: ICU   = Goals of Care =   Code Status: Full Code   Primary Emergency Contact: [place], Home Phone: [phone] Wishes to pursue full aggressive treatment and intervention options, including CPR and intubation, but goals of care will be addressed on going with family if that should become necessary.   Ottie Glazier, M.D.  Pulmonary & Critical Care Medicine

## 2021-07-14 NOTE — Progress Notes (Addendum)
Date of Admission:  07/08/2021     ID: Craig [name] is a 30 y.o. male  Principal Problem:   Acute on chronic respiratory failure with hypoxemia (HCC) Active Problems:   Colostomy status (HCC)   Pressure injury of skin    Subjective: Pt is a little sob today  Medications:   vitamin C  500 mg Oral BID   chlorhexidine gluconate (MEDLINE KIT)  15 mL Mouth Rinse BID   Chlorhexidine Gluconate Cloth  6 each Topical Q0600   clonazePAM  1 mg Oral Daily   COVID-19 mRNA bivalent vaccine (Moderna)  0.5 mL Intramuscular Once   docusate sodium  100 mg Oral BID   DULoxetine  60 mg Oral Daily   feeding supplement (NEPRO CARB STEADY)  237 mL Oral TID BM   folic acid  1 mg Per Tube Daily   furosemide  40 mg Intravenous Daily   heparin injection (subcutaneous)  5,000 Units Subcutaneous Q8H   insulin aspart  0-20 Units Subcutaneous Q4H   insulin glargine-yfgn  15 Units Subcutaneous BID   lidocaine  1 patch Transdermal Q24H   metoprolol succinate  100 mg Oral Daily   multivitamin with minerals  1 tablet Oral Daily   nicotine  21 mg Transdermal Daily   polyethylene glycol  17 g Per Tube Daily   potassium chloride  40 mEq Oral Q4H   predniSONE  5 mg Per Tube Q breakfast   pregabalin  200 mg Oral BID   zinc oxide   Topical Daily    Objective: Vital signs in last 24 hours: Temp:  [97.7 F (36.5 C)-98 F (36.7 C)] 97.7 F (36.5 C) (11/25 0400) Pulse Rate:  [86-111] 111 (11/25 0900) Resp:  [15-23] 19 (11/25 0900) BP: (89-155)/(38-88) 155/79 (11/25 0900) SpO2:  [94 %-100 %] 100 % (11/25 0900)  PHYSICAL EXAM:  General: Alert, cooperative, minimal resp distress, .  Head: Normocephalic, without obvious abnormality, atraumatic. Eyes: Conjunctivae clear, anicteric sclerae. Pupils are equal ENT Nares normal. No drainage or sinus tenderness. Lips, mucosa, and tongue normal. No Thrush Neck: , symmetrical, no adenopathy, thyroid: non tender Moon faced  Lungs: b/l air entry- few  crepts Heart: tachycardia Abdomen: Soft,colostomy  Extremities: all four edematous-  Left forearm at the site of prior IV has erythema , small urticaria and swelling   Skin: No rashes or lesions. Or bruising Lymph: Cervical, supraclavicular normal. Neurologic: Grossly non-focal  Lab Results Recent Labs    07/13/21 0409 07/13/21 0546 07/14/21 0702  WBC  --  8.8 9.3  HGB  --  9.0* 8.7*  HCT  --  31.4* 30.7*  NA 140  --  138  K 4.2  --  3.2*  CL 89*  --  85*  CO2 40*  --  45*  BUN 7  --  6  CREATININE 0.48*  --  0.34*   Liver Panel Recent Labs    07/12/21 0626  PROT 5.5*  ALBUMIN 2.6*  AST 15  ALT 24  ALKPHOS 42  BILITOT 0.9  BILIDIR 0.2  IBILI 0.7   Sedimentation Rate No results for input(s): ESRSEDRATE in the last 72 hours. C-Reactive Protein No results for input(s): CRP in the last 72 hours.  Microbiology:  Studies/Results: DG Chest Port 1 View  Result Date: 07/13/2021 CLINICAL DATA:  Shortness of breath.  Recent RIGHT knee surgery. EXAM: PORTABLE CHEST 1 VIEW COMPARISON:  07/11/2021 and prior studies FINDINGS: An endotracheal tube has been removed. The cardiomediastinal silhouette is  unchanged. RIGHT hemidiaphragm elevation is again noted. Bilateral airspace opacities are again noted with slightly increased RIGHT basilar opacity/atelectasis. There is no evidence of pneumothorax or large pleural effusion. IMPRESSION: 1. Endotracheal tube removal with slightly increased RIGHT basilar opacity/atelectasis. 2. Bilateral airspace opacities again noted. Electronically Signed   By: Margarette Canada M.D.   On: 07/13/2021 10:57     Assessment/Plan: MSSa bacteremia MSSA rt knee septic arthritis TEE neg Repeat blood culture neg Pt currently on nafcillin The blister and irritation of the left forearm was due to extravasation of nafcillin and not due to an allergic reaction Will need 6 weeks of Iv antibiotic- Before discharge will change to cefazolin  Acute hypoxic resp  failure- s/p extubation  H/O COVID with long hospitalization in Dec- Fb 2022 , needing tracheostomy and PEG  Polyneuropathy- critical illness related   Complicated diverticulitis , perforation, s/p colectomy and colostomy ID will follow him peripherally this weekend- call if needed

## 2021-07-14 NOTE — Progress Notes (Signed)
PROGRESS NOTE    Craig [name]  SKA:[id number] DOB: 08/20/1991 DOA: 07/08/2021 PCP: Craig Ruths, MD  [id number]/[id number]-[id number]   Assessment & Plan:   Principal Problem:   Acute on chronic respiratory failure with hypoxemia [facility]) Active Problems:   Colostomy status ([place])   Pressure injury of skin    Craig [name] is a 30 y.o. male with significant PMH of extensive medical history including inflammatory arthritis on methotrexate and chronic prednisone at 10 mg daily, prior severe Covid-19 infection requiring prolonged hospitalization and tracheostomy (since decannulated) Dec 2021 - Feb 2022, chronic hypoxic and hypercarbic respiratory failure on 2L O2 PRN, MSSA bacteremia, MSSA pneumonia, perforated diverticulitis s/p partial colectomy with ostomy creation, morbid obesity and gout who presented to the ED with chief complaints of progressive shortness of breath. Patient is currently intubated, history mostly obtained from patient's chart and patient's wife who is currently at the bedside.  Per patient's wife, EMS was called today due to worsening shortness of breath and intermittent altered mental status.  [name] stated the patient has been complaining of right knee pain was prescribed Percocet on Monday.  She took the Percocet at Monday and noticed worsening shortness of breath with mental status change per wife.  He also takes tramadol for pain.  No reports of chest pain, nausea vomiting, abdominal pain, diaphoresis, fevers or chills, cough or any other focal neurological symptoms or deficit.  On EMS arrival patient was noted to be hypoxic with a heart rate between 140 to 150 bpm.  He was placed on 6 L to maintain SPO2 greater than 90% and transported to the ED. In the ED, pt was noted to have seizure-like activity.  Pt was intubated, and PCCM consulted for admission and further management.  Pt was transferred to hospitalist service on 07/14/21.   Acute on chronic hypoxic  and hypercarbic respiratory failure On chronic 2L home O2 - Extubated 11/23.  Currently on 5L [place]. --Continue supplemental O2 to keep sats >=90%, wean as tolerated  Sepsis 2/2  MSSA bacteremia  Right septic joint s/p I/D on 07/11/21 -Infectious Disease consulted -s/p TEE - no veg -Repeat blood cultures 72h to clear Plan: --cont nafcillin --pain control for right knee   Shock -RESOLVED  - Favor sedation/analgesia related hypotension rather than septic. --s/p pressors --cont home prednisone (for rheumatoid arthritis); no indication for stress dose steroids at this time  Acute toxic metabolic encephalopathy 2/2 sepsis  -CT Head wnl -Supportive care -Neurology consulted   Seizure Like Activity- RESOLVED -CT Head wnl -Loaded with Keppra 1500 mg x 1 -Stop Keppra; monitor for recurrence of seizure activity, per neuro   H/o upper extremity DVT It appears that he was started on Xarelto before around Feb/Mar 2022 for provoked upper extremity DVT. He has completed an appropriate course of anticoagulation at this time and therapeutic anticoagulation will be discontinued. CTA chest did not reveal PE on admission.   DM2, well controlled --A1c 6.8 --cont glargine 15u BID --SSI TID  Probable OSA --look into getting pt qualified for CPAP  Anasarca --cont IV lasix 40 mg as BID --strict I/O   DVT prophylaxis: Lovenox SQ Code Status: Full code  Family Communication:  Level of care: Telemetry Surgical Dispo:   The patient is from: home Anticipated d/c is to: SNF Anticipated d/c date is: 2-3 days Patient currently is not medically ready to d/c due to: on 5L O2   Subjective and Interval History:  Per RN, pt still having a lot of  pain in his knee.  Pt reported eating ok, and normal output in his ostomy bag.   Objective: Vitals:   07/14/21 1400 07/14/21 1533 07/14/21 1600 07/14/21 1647  BP: 137/66   (!) 120/44  Pulse: (!) 110  (!) 107 (!) 103  Resp: (!) 27 (!) 22 (!) 29 (!) 22   Temp:  98 F (36.7 C)    TempSrc:  Oral    SpO2: 97% 98% 93% 100%  Weight:      Height:        Intake/Output Summary (Last 24 hours) at 07/14/2021 1829 Last data filed at 07/14/2021 1822 Gross per 24 hour  Intake 382.48 ml  Output 2595 ml  Net -2212.52 ml   Filed Weights   07/11/21 0500 07/12/21 0500 07/13/21 0500  Weight: (!) 144.9 kg (!) 145.1 kg (!) 145.1 kg    Examination:   Constitutional: NAD, AAOx3 HEENT: conjunctivae and lids normal, EOMI CV: No cyanosis.   RESP: increased respiratory effort, on 5L GI: light brown soft stool in colostomy bag Extremities: swelling in RLE and right foot.  Honey cone dressing over right knee. SKIN: extensive skin macerations under his abdominal skin folds Neuro: II - XII grossly intact.   Psych: depressed mood and affect.    Data Reviewed: I have personally reviewed following labs and imaging studies  CBC: Recent Labs  Lab 07/08/21 0821 07/09/21 0129 07/10/21 0528 07/11/21 0605 07/12/21 0635 07/13/21 0546 07/14/21 0702  WBC 9.8   < > 8.2 8.9 7.7 8.8 9.3  NEUTROABS 7.4  --   --   --   --   --   --   HGB 9.9*   < > 9.0* 9.6* 8.3* 9.0* 8.7*  HCT 33.6*   < > 30.0* 32.6* 27.7* 31.4* 30.7*  MCV 96.0   < > 92.9 94.8 93.9 95.7 97.5  PLT 201   < > 194 191 191 235 254   < > = values in this interval not displayed.   Basic Metabolic Panel: Recent Labs  Lab 07/10/21 0528 07/11/21 0605 07/12/21 0635 07/13/21 0409 07/14/21 0702  NA 138 137 137 140 138  K 3.5 3.5 3.1* 4.2 3.2*  CL 91* 89* 87* 89* 85*  CO2 40* 38* 43* 40* 45*  GLUCOSE 263* 191* 104* 155* 155*  BUN _0 CREATININE 0.52* 0.60* 0.57* 0.48* 0.34*  CALCIUM 8.6* 9.0 8.3* 8.6* 8.6*  MG 2.2 1.9 1.8 1.7 2.1  PHOS 3.5 5.2* 2.8 4.2 2.9   GFR: Estimated Creatinine Clearance: 198.8 mL/min (A) (by C-G formula based on SCr of 0.34 mg/dL (L)). Liver Function Tests: Recent Labs  Lab 07/08/21 0821 07/12/21 0626  AST 30 15  ALT 44 24  ALKPHOS 56 42   BILITOT 1.2 0.9  PROT 6.4* 5.5*  ALBUMIN 2.9*  3.0* 2.6*   No results for input(s): LIPASE, AMYLASE in the last 168 hours. No results for input(s): AMMONIA in the last 168 hours. Coagulation Profile: Recent Labs  Lab 07/08/21 1829  INR 1.0   Cardiac Enzymes: No results for input(s): CKTOTAL, CKMB, CKMBINDEX, TROPONINI in the last 168 hours. BNP (last 3 results) No results for input(s): PROBNP in the last 8760 hours. HbA1C: No results for input(s): HGBA1C in the last 72 hours. CBG: Recent Labs  Lab 07/14/21 0010 07/14/21 0436 07/14/21 0718 07/14/21 1125 07/14/21 1543  GLUCAP 138* 127* 124* 194* 234*   Lipid Profile: Recent Labs    07/12/21 0635  TRIG 268*  Thyroid Function Tests: No results for input(s): TSH, T4TOTAL, FREET4, T3FREE, THYROIDAB in the last 72 hours. Anemia Panel: No results for input(s): VITAMINB12, FOLATE, FERRITIN, TIBC, IRON, RETICCTPCT in the last 72 hours. Sepsis Labs: Recent Labs  Lab 07/08/21 0258 07/08/21 1203 07/08/21 1448 07/09/21 0129  PROCALCITON  --   --  0.33 1.18  LATICACIDVEN 3.5* 1.9  --   --     Recent Results (from the past 240 hour(s))  Blood Culture (routine x 2)     Status: Abnormal   Collection Time: 07/08/21  8:21 AM   Specimen: BLOOD  Result Value Ref Range Status   Specimen Description   Final    BLOOD RIGHT ANTECUBITAL Performed at [facility], [address], [place], [place] [address]    Special Requests   Final    BOTTLES DRAWN AEROBIC AND ANAEROBIC Blood Culture results may not be optimal due to an excessive volume of blood received in culture bottles Performed at [facility], [address], [place], [place] [address]    Culture  Setup Time   Final    Organism ID to follow [place] CRITICAL RESULT CALLED TO, READ BACK BY AND VERIFIED WITH: NATHAN BELEU AT 0230 07/09/2021 DLB Performed at [facility], [address] [place], [place] [address]    Culture STAPHYLOCOCCUS AUREUS (A)  Final   Report Status 07/11/2021 FINAL  Final   Organism ID, Bacteria STAPHYLOCOCCUS AUREUS  Final      Susceptibility   Staphylococcus aureus - MIC*    CIPROFLOXACIN 1 SENSITIVE Sensitive     ERYTHROMYCIN RESISTANT Resistant     GENTAMICIN <=0.5 SENSITIVE Sensitive     OXACILLIN <=0.25 SENSITIVE Sensitive     TETRACYCLINE <=1 SENSITIVE Sensitive     VANCOMYCIN <=0.5 SENSITIVE Sensitive     TRIMETH/SULFA <=10 SENSITIVE Sensitive     CLINDAMYCIN RESISTANT Resistant     RIFAMPIN <=0.5 SENSITIVE Sensitive     Inducible Clindamycin POSITIVE Resistant     * STAPHYLOCOCCUS AUREUS  Blood Culture (routine x 2)     Status: Abnormal   Collection Time: 07/08/21  8:21 AM   Specimen: BLOOD  Result Value Ref Range Status   Specimen Description   Final    BLOOD BLOOD RIGHT FOREARM Performed at [facility], [address], [place], [place] [address]    Special Requests   Final    BOTTLES DRAWN AEROBIC AND ANAEROBIC Blood Culture results may not be optimal due to an excessive volume of blood received in culture bottles Performed at [facility], [address] [place], [place] [address]    Culture  Setup Time   Final    GRAM POSITIVE COCCI ANAEROBIC BOTTLE ONLY CRITICAL VALUE NOTED.  VALUE IS CONSISTENT WITH PREVIOUSLY REPORTED AND CALLED VALUE. Performed at [facility], [address] [place], [place] [address]    Culture (A)  Final    STAPHYLOCOCCUS AUREUS SUSCEPTIBILITIES PERFORMED ON PREVIOUS CULTURE WITHIN THE LAST 5 DAYS. Performed at [facility], [place] [address] [place],  [address]    Report Status 07/12/2021 FINAL  Final  Blood Culture ID Panel (Reflexed)     Status: Abnormal   Collection Time: 07/08/21  8:21 AM  Result Value Ref Range Status   Enterococcus faecalis NOT DETECTED NOT DETECTED Final   Enterococcus Faecium NOT DETECTED NOT DETECTED Final   Listeria  monocytogenes NOT DETECTED NOT DETECTED Final  Staphylococcus species DETECTED (A) NOT DETECTED Final    Comment: CRITICAL RESULT CALLED TO, READ BACK BY AND VERIFIED WITH: NATHAN BELEU AT 0230 07/09/2021 DLB    Staphylococcus aureus (BCID) DETECTED (A) NOT DETECTED Final    Comment: CRITICAL RESULT CALLED TO, READ BACK BY AND VERIFIED WITH: NATHAN BELEU AT 0230 07/09/2021 DLB    Staphylococcus epidermidis NOT DETECTED NOT DETECTED Final   Staphylococcus lugdunensis NOT DETECTED NOT DETECTED Final   Streptococcus species NOT DETECTED NOT DETECTED Final   Streptococcus agalactiae NOT DETECTED NOT DETECTED Final   Streptococcus pneumoniae NOT DETECTED NOT DETECTED Final   Streptococcus pyogenes NOT DETECTED NOT DETECTED Final   A.calcoaceticus-baumannii NOT DETECTED NOT DETECTED Final   Bacteroides fragilis NOT DETECTED NOT DETECTED Final   Enterobacterales NOT DETECTED NOT DETECTED Final   Enterobacter cloacae complex NOT DETECTED NOT DETECTED Final   Escherichia coli NOT DETECTED NOT DETECTED Final   Klebsiella aerogenes NOT DETECTED NOT DETECTED Final   Klebsiella oxytoca NOT DETECTED NOT DETECTED Final   Klebsiella pneumoniae NOT DETECTED NOT DETECTED Final   Proteus species NOT DETECTED NOT DETECTED Final   Salmonella species NOT DETECTED NOT DETECTED Final   Serratia marcescens NOT DETECTED NOT DETECTED Final   Haemophilus influenzae NOT DETECTED NOT DETECTED Final   Neisseria meningitidis NOT DETECTED NOT DETECTED Final   Pseudomonas aeruginosa NOT DETECTED NOT DETECTED Final   Stenotrophomonas maltophilia NOT DETECTED NOT DETECTED Final   Candida albicans NOT DETECTED NOT DETECTED Final   Candida auris NOT DETECTED NOT DETECTED Final   Candida glabrata NOT DETECTED NOT DETECTED Final   Candida krusei NOT DETECTED NOT DETECTED Final   Candida parapsilosis NOT DETECTED NOT DETECTED Final   Candida tropicalis NOT DETECTED NOT DETECTED Final   Cryptococcus neoformans/gattii NOT  DETECTED NOT DETECTED Final   Meth resistant mecA/C and MREJ NOT DETECTED NOT DETECTED Final    Comment: Performed at [facility], [address] [place], [place] [address]  Resp Panel by RT-PCR (Flu A&B, Covid) Nasopharyngeal Swab     Status: None   Collection Time: 07/08/21  1:59 PM   Specimen: Nasopharyngeal Swab; Nasopharyngeal(NP) swabs in vial transport medium  Result Value Ref Range Status   SARS Coronavirus 2 by RT PCR NEGATIVE NEGATIVE Final    Comment: (NOTE) SARS-CoV-2 target nucleic acids are NOT DETECTED.  The SARS-CoV-2 RNA is generally detectable in upper respiratory specimens during the acute phase of infection. The lowest concentration of SARS-CoV-2 viral copies this assay can detect is 138 copies/mL. A negative result does not preclude SARS-Cov-2 infection and should not be used as the sole basis for treatment or other patient management decisions. A negative result may occur with  improper specimen collection/handling, submission of specimen other than nasopharyngeal swab, presence of viral mutation(s) within the areas targeted by this assay, and inadequate number of viral copies(<138 copies/mL). A negative result must be combined with clinical observations, patient history, and epidemiological information. The expected result is Negative.  Fact Sheet for Patients:  EntrepreneurPulse.com.au  Fact Sheet for Healthcare Providers:  IncredibleEmployment.be  This test is no t yet approved or cleared by the [place] FDA and  has been authorized for detection and/or diagnosis of SARS-CoV-2 by FDA under an Emergency Use Authorization (EUA). This EUA will remain  in effect (meaning this test can be used) for the duration of the COVID-19 declaration under Section 564(b)(1) of the Act, 21 U.S.C.section 360bbb-3(b)(1), unless the authorization is terminated  or revoked sooner.  Influenza A by PCR NEGATIVE NEGATIVE  Final   Influenza B by PCR NEGATIVE NEGATIVE Final    Comment: (NOTE) The Xpert Xpress SARS-CoV-2/FLU/RSV plus assay is intended as an aid in the diagnosis of influenza from Nasopharyngeal swab specimens and should not be used as a sole basis for treatment. Nasal washings and aspirates are unacceptable for Xpert Xpress SARS-CoV-2/FLU/RSV testing.  Fact Sheet for Patients: EntrepreneurPulse.com.au  Fact Sheet for Healthcare Providers: IncredibleEmployment.be  This test is not yet approved or cleared by the [place] FDA and has been authorized for detection and/or diagnosis of SARS-CoV-2 by FDA under an Emergency Use Authorization (EUA). This EUA will remain in effect (meaning this test can be used) for the duration of the COVID-19 declaration under Section 564(b)(1) of the Act, 21 U.S.C. section 360bbb-3(b)(1), unless the authorization is terminated or revoked.  Performed at [facility], [address] [place], [place] [address]   MRSA Next Gen by PCR, Nasal     Status: None   Collection Time: 07/08/21  4:24 PM   Specimen: Nasal Mucosa; Nasal Swab  Result Value Ref Range Status   MRSA by PCR Next Gen NOT DETECTED NOT DETECTED Final    Comment: (NOTE) The GeneXpert MRSA Assay (FDA approved for NASAL specimens only), is one component of a comprehensive MRSA colonization surveillance program. It is not intended to diagnose MRSA infection nor to guide or monitor treatment for MRSA infections. Test performance is not FDA approved in patients less than 2 years old. Performed at [facility], [place], [place] [address]   Respiratory (~20 pathogens) panel by PCR     Status: None   Collection Time: 07/08/21  5:30 PM   Specimen: Nasopharyngeal Swab; Respiratory  Result Value Ref Range Status   Adenovirus NOT DETECTED NOT DETECTED Final   Coronavirus 229E NOT DETECTED NOT DETECTED Final    Comment:  (NOTE) The Coronavirus on the Respiratory Panel, DOES NOT test for the novel  Coronavirus (2019 nCoV)    Coronavirus HKU1 NOT DETECTED NOT DETECTED Final   Coronavirus NL63 NOT DETECTED NOT DETECTED Final   Coronavirus OC43 NOT DETECTED NOT DETECTED Final   Metapneumovirus NOT DETECTED NOT DETECTED Final   Rhinovirus / Enterovirus NOT DETECTED NOT DETECTED Final   Influenza A NOT DETECTED NOT DETECTED Final   Influenza B NOT DETECTED NOT DETECTED Final   Parainfluenza Virus 1 NOT DETECTED NOT DETECTED Final   Parainfluenza Virus 2 NOT DETECTED NOT DETECTED Final   Parainfluenza Virus 3 NOT DETECTED NOT DETECTED Final   Parainfluenza Virus 4 NOT DETECTED NOT DETECTED Final   Respiratory Syncytial Virus NOT DETECTED NOT DETECTED Final   Bordetella pertussis NOT DETECTED NOT DETECTED Final   Bordetella Parapertussis NOT DETECTED NOT DETECTED Final   Chlamydophila pneumoniae NOT DETECTED NOT DETECTED Final   Mycoplasma pneumoniae NOT DETECTED NOT DETECTED Final    Comment: Performed at [facility], [place], [place], [place] [address]  CULTURE, BLOOD (ROUTINE X 2) w Reflex to ID Panel     Status: None   Collection Time: 07/09/21  2:39 PM   Specimen: BLOOD  Result Value Ref Range Status   Specimen Description BLOOD THUMB R  Final   Special Requests   Final    BOTTLES DRAWN AEROBIC AND ANAEROBIC Blood Culture results may not be optimal due to an inadequate volume of blood received in culture bottles   Culture   Final    NO GROWTH  5 DAYS Performed at [facility], [address] [place], [place] [address]    Report Status 07/14/2021 FINAL  Final  CULTURE, BLOOD (ROUTINE X 2) w Reflex to ID Panel     Status: None   Collection Time: 07/09/21  2:50 PM   Specimen: BLOOD RIGHT HAND  Result Value Ref Range Status   Specimen Description BLOOD RIGHT HAND  Final   Special Requests   Final    BOTTLES DRAWN AEROBIC AND ANAEROBIC Blood Culture adequate volume   Culture    Final    NO GROWTH 5 DAYS Performed at [facility], [address] [place], [place] [address]    Report Status 07/14/2021 FINAL  Final  Aerobic/Anaerobic Culture w Gram Stain (surgical/deep wound)     Status: None (Preliminary result)   Collection Time: 07/11/21  3:29 PM   Specimen: PATH Other; Tissue  Result Value Ref Range Status   Specimen Description   Final    SYNOVIAL Performed at [facility], [address] [place], [place] [address]    Special Requests   Final    RIGHT KNEE Performed at [facility], [address] [place], [place] [address]    Gram Stain   Final    FEW WBC PRESENT, PREDOMINANTLY MONONUCLEAR FEW GRAM POSITIVE COCCI    Culture   Final    RARE STAPHYLOCOCCUS AUREUS NO ANAEROBES ISOLATED; CULTURE IN PROGRESS FOR 5 DAYS SUSCEPTIBILITIES TO FOLLOW Performed at [facility], [place] [address], [place], [place] [address]    Report Status PENDING  Incomplete  Aerobic/Anaerobic Culture w Gram Stain (surgical/deep wound)     Status: None (Preliminary result)   Collection Time: 07/11/21  3:29 PM   Specimen: PATH Other; Tissue  Result Value Ref Range Status   Specimen Description SYNOVIAL  Final   Special Requests RIGHT KNEE SYNOVIAL FLUID 2  Final   Gram Stain   Final    ABUNDANT WBC PRESENT,BOTH PMN AND MONONUCLEAR MODERATE GRAM POSITIVE COCCI    Culture   Final    MODERATE STAPHYLOCOCCUS AUREUS NO ANAEROBES ISOLATED; CULTURE IN PROGRESS FOR 5 DAYS    Report Status PENDING  Incomplete   Organism ID, Bacteria STAPHYLOCOCCUS AUREUS  Final      Susceptibility   Staphylococcus aureus - MIC*    CIPROFLOXACIN <=0.5 SENSITIVE Sensitive     ERYTHROMYCIN RESISTANT Resistant     GENTAMICIN <=0.5 SENSITIVE Sensitive     OXACILLIN <=0.25 SENSITIVE Sensitive     TETRACYCLINE <=1 SENSITIVE Sensitive     VANCOMYCIN 1 SENSITIVE Sensitive     TRIMETH/SULFA <=10 SENSITIVE Sensitive     CLINDAMYCIN RESISTANT Resistant      RIFAMPIN <=0.5 SENSITIVE Sensitive     Inducible Clindamycin Value in next row Resistant      POSITIVEPerformed at [facility] [address] [place], [place] [address]    * MODERATE STAPHYLOCOCCUS AUREUS  Aerobic/Anaerobic Culture w Gram Stain (surgical/deep wound)     Status: None (Preliminary result)   Collection Time: 07/11/21  3:32 PM   Specimen: PATH Other; Tissue  Result Value Ref Range Status   Specimen Description   Final    SYNOVIAL Performed at [facility], [address] [place] [address], [place], [place] [address]    Special Requests   Final    RIGHT KNEE Performed at [facility], [address] [place], [place] [address]    Gram Stain   Final    RARE  WBC PRESENT, PREDOMINANTLY MONONUCLEAR NO ORGANISMS SEEN    Culture   Final    NO GROWTH 3 DAYS NO ANAEROBES ISOLATED; CULTURE IN PROGRESS FOR 5 DAYS Performed at [facility] [address], [place], [place] [address]    Report Status PENDING  Incomplete  Acid Fast Smear (AFB)     Status: None   Collection Time: 07/11/21  3:32 PM  Result Value Ref Range Status   AFB Specimen Processing Concentration  Final   Acid Fast Smear Negative  Final    Comment: (NOTE) Performed At: [facility] [place], [place] [id number] Rush Farmer MD [id number]    Source (AFB) FLUID  Final    Comment: FLUID RIGHT KNEE SYNOVIUM Performed at [facility], [address] [place], [place] [address]   Aerobic/Anaerobic Culture w Gram Stain (surgical/deep wound)     Status: None (Preliminary result)   Collection Time: 07/11/21  3:34 PM   Specimen: PATH Other; Tissue  Result Value Ref Range Status   Specimen Description   Final    SYNOVIAL Performed at [facility], [address], [place], [place] [address]    Special Requests   Final    RIGHT KNEE 2 Performed at [facility], [place], [place] [address]    Gram Stain   Final    FEW WBC  PRESENT, PREDOMINANTLY MONONUCLEAR RARE GRAM POSITIVE COCCI    Culture   Final    RARE STAPHYLOCOCCUS AUREUS NO ANAEROBES ISOLATED; CULTURE IN PROGRESS FOR 5 DAYS SUSCEPTIBILITIES TO FOLLOW Performed at [facility], [address], [place], [place] [address]    Report Status PENDING  Incomplete  Aerobic/Anaerobic Culture w Gram Stain (surgical/deep wound)     Status: None (Preliminary result)   Collection Time: 07/11/21  3:35 PM   Specimen: PATH Other; Tissue  Result Value Ref Range Status   Specimen Description   Final    SYNOVIAL Performed at [facility], [address], [place], [place] [address]    Special Requests   Final    RIGHT KNEE 3 Performed at [facility], [address] [place], [place] [address]    Gram Stain   Final    RARE WBC PRESENT, PREDOMINANTLY MONONUCLEAR NO ORGANISMS SEEN    Culture   Final    RARE STAPHYLOCOCCUS AUREUS NO ANAEROBES ISOLATED; CULTURE IN PROGRESS FOR 5 DAYS CRITICAL RESULT CALLED TO, READ BACK BY AND VERIFIED WITH: RN RANDY RANDY P.  [id number] FCP SUSCEPTIBILITIES TO FOLLOW Performed at [facility], [place] [address] [place], [place] [address]    Report Status PENDING  Incomplete  Acid Fast Smear (AFB)     Status: None   Collection Time: 07/11/21  3:35 PM   Specimen: PATH Other; Tissue  Result Value Ref Range Status   AFB Specimen Processing Concentration  Final   Acid Fast Smear Negative  Final    Comment: (NOTE) Performed At: [facility] [place], [place] [id number] Rush Farmer MD [id number]    Source (AFB) SYNOVIAL  Final    Comment: Performed at [facility], [address] [place], [place] [address]      Radiology Studies: [facility] Chest Port 1 View  Result Date: 07/13/2021 CLINICAL DATA:  Shortness of breath.  Recent RIGHT knee surgery. EXAM: PORTABLE CHEST 1 VIEW COMPARISON:  07/11/2021 and prior studies FINDINGS: An endotracheal tube has been  removed. The cardiomediastinal  silhouette is unchanged. RIGHT hemidiaphragm elevation is again noted. Bilateral airspace opacities are again noted with slightly increased RIGHT basilar opacity/atelectasis. There is no evidence of pneumothorax or large pleural effusion. IMPRESSION: 1. Endotracheal tube removal with slightly increased RIGHT basilar opacity/atelectasis. 2. Bilateral airspace opacities again noted. Electronically Signed   By: Margarette Canada M.D.   On: 07/13/2021 10:57     Scheduled Meds:  vitamin C  500 mg Oral BID   chlorhexidine gluconate (MEDLINE KIT)  15 mL Mouth Rinse BID   Chlorhexidine Gluconate Cloth  6 each Topical Q0600   clonazePAM  1 mg Oral Daily   COVID-19 mRNA bivalent vaccine (Moderna)  0.5 mL Intramuscular Once   cyclobenzaprine  10 mg Oral TID   docusate sodium  100 mg Oral BID   DULoxetine  60 mg Oral Daily   feeding supplement (NEPRO CARB STEADY)  237 mL Oral TID BM   folic acid  1 mg Per Tube Daily   furosemide  40 mg Intravenous Daily   heparin injection (subcutaneous)  5,000 Units Subcutaneous Q8H   insulin aspart  0-20 Units Subcutaneous Q4H   insulin glargine-yfgn  15 Units Subcutaneous BID   lidocaine  1 patch Transdermal Q24H   metoprolol succinate  100 mg Oral Daily   multivitamin with minerals  1 tablet Oral Daily   nicotine  21 mg Transdermal Daily   polyethylene glycol  17 g Per Tube Daily   predniSONE  5 mg Per Tube Q breakfast   pregabalin  200 mg Oral BID   sodium chloride flush  10-40 mL Intracatheter Q12H   zinc oxide   Topical Daily   Continuous Infusions:  sodium chloride Stopped (07/11/21 1454)   sodium chloride Stopped (07/11/21 1653)   famotidine (PEPCID) IV Stopped (07/14/21 0933)   nafcillin (NAFCIL) continuous infusion 20.8 mL/hr at 07/14/21 1822     LOS: 6 days     Enzo Bi, MD [facility] If 7PM-7AM, please contact night-coverage 07/14/2021, 6:29 PM

## 2021-07-14 NOTE — Progress Notes (Signed)
Pt A&OX4 weak movement all extremities. Per pt wife pt has had pale extremities and decreased sensation since he had covid in the past. Pedal pulses 1+, Radial pulses 2+. Knee site clean, dry, intact. PRN pain medication for knee pain. MD notified of continued pain and stiffness. New orders received. Report called to [facility] RN 1A. Verbal order to D/C tele monitoring for floor care.

## 2021-07-14 NOTE — Plan of Care (Signed)

## 2021-07-14 NOTE — Progress Notes (Signed)
Subjective: 3 Days Post-Op Procedure(s) (LRB): SYNOVIAL BIOPSY AND ARTHROSCOPIC IRRGATION AND DEBRIDEMENT OF SEPTIC KNEE (Right) SYNOVIAL BIOPSY (Right) Reports mild pain in the right knee. Denies any N/V this morning. Was able to eat well following extubation yesterday. ACE wrap intact to the right knee, hemovac was removed.  Objective: Vital signs in last 24 hours: Temp:  [97.7 F (36.5 C)-98.3 F (36.8 C)] 97.7 F (36.5 C) (11/25 0400) Pulse Rate:  [86-107] 107 (11/25 0600) Resp:  [14-34] 22 (11/25 0600) BP: (89-151)/(38-68) 151/62 (11/25 0600) SpO2:  [90 %-100 %] 98 % (11/25 0600)  Intake/Output from previous day: 11/24 0701 - 11/25 0700 In: 769.7 [IV Piggyback:769.7] Out: 5745 [Urine:4895; Stool:850] Intake/Output this shift: No intake/output data recorded.  Recent Labs    07/12/21 0635 07/13/21 0546 07/14/21 0702  HGB 8.3* 9.0* 8.7*   Recent Labs    07/13/21 0546 07/14/21 0702  WBC 8.8 9.3  RBC 3.28* 3.15*  HCT 31.4* 30.7*  PLT 235 254   Recent Labs    07/12/21 0635 07/13/21 0409  NA 137 140  K 3.1* 4.2  CL 87* 89*  CO2 43* 40*  BUN 7 7  CREATININE 0.57* 0.48*  GLUCOSE 104* 155*  CALCIUM 8.3* 8.6*   No results for input(s): LABPT, INR in the last 72 hours.  EXAM General - Patient is well, oriented and appropriate. Right lower Extremity - ACE wrap intact to the right leg. ACE wrap was removed and bulky dressing removed. No purulent drainage noted to the right knee, honeycomb dressing was applied to the arm. Moderate lymphadenopathy to bilateral legs.  Wound to the dorsal aspect of the right foot. He is able to dorsiflex and plantarflex the foot. Intact to light touch to bilateral legs.  Past Medical History:  Diagnosis Date   Gout    Hypertension    Rupture of bowel (HCC)     Assessment/Plan:   3 Days Post-Op Procedure(s) (LRB): SYNOVIAL BIOPSY AND ARTHROSCOPIC IRRGATION AND DEBRIDEMENT OF SEPTIC KNEE (Right) SYNOVIAL BIOPSY  (Right) Principal Problem:   Acute on chronic respiratory failure with hypoxemia (HCC) Active Problems:   Colostomy status (HCC)   Pressure injury of skin  Estimated body mass index is 42.2 kg/m as calculated from the following:   Height as of this encounter: 6\' 1"  (1.854 m).   Weight as of this encounter: 145.1 kg.   Cultures demonstrated staph aureus  Currently on Nafcillin. Gram stain with rare WBC, no organisms grown yet.  Bulky dressing removed today, honeycomb dressings applied. ACE wrap was not re-applied due to the significant swelling and edema to the lower aspect of the leg. PT order will be placed for patient to start working with PT while admitted. Progress activities as tolerated.  DVT Prophylaxis -  Heparin  Raquel Kresta Templeman, PA-C [place] 07/14/2021, 8:21 AM

## 2021-07-14 NOTE — Evaluation (Signed)
Physical Therapy Evaluation Patient Details Name: Craig [name] MRN: [id number] DOB: 22-Aug-1990 Today's Date: 07/14/2021  History of Present Illness  Pt is a 30 y/o M admitted on 07/08/21 after presenting to the ED with c/o SOB & intermittent AMS. Pt was c/o R knee pain & perscribed pain medication on Monday. On arrival pt was found to be hypoxic with elevated HR & during exam was noted to have seizure like activity lasting <60 seconds. Pt is being treated for septic encephalopathy. Knee cultures revealed staph aureus & septic arthritis. PMH: inflammatory arthritis, prior severe covid19 infection requiring prolonged hospitalization & tracheostomy, chronic hypoxic & hypercarbic respiratory failure on 2L O2, MSSA bacteremia & PNA, perforated diverticulitis s/p partial colectomy with ostomy creation, morbid obesity, gout  Clinical Impression  Pt seen for PT evaluation with wife present for session. Prior to admission pt was very sedentary, staying in lift chair the majority of the time. On this date, pt demonstrates edema in BLE feet & hands and c/o R knee pain with movement. Pt requires +2 for supine<>sit but is able to sit EOB ~5-7 minutes with supervision. Pt on 4.5L/min via nasal cannula but requests wife increase O2 (she does so to 7L) when he sits EOB - nurse made aware. Encouraged pt to sit up on side of bed as much as he can with nursing assistance to increase strength & activity tolerance. Pt would benefit from STR upon d/c to maximize independence with functional mobility, reduce fall risk, & decrease caregiver burden prior to return home.        Recommendations for follow up therapy are one component of a multi-disciplinary discharge planning process, led by the attending physician.  Recommendations may be updated based on patient status, additional functional criteria and insurance authorization.  Follow Up Recommendations Skilled nursing-short term rehab (<3 hours/day)    Assistance  Recommended at Discharge    Functional Status Assessment  (minimal change in functional status)  Equipment Recommendations  None recommended by PT    Recommendations for Other Services       Precautions / Restrictions Precautions Precautions: Fall Restrictions Weight Bearing Restrictions: Yes RLE Weight Bearing: Weight bearing as tolerated      Mobility  Bed Mobility Overal bed mobility: Needs Assistance Bed Mobility: Supine to Sit;Sit to Supine     Supine to sit: Max assist;+2 for physical assistance;HOB elevated Sit to supine: Max assist;+2 for physical assistance;+2 for safety/equipment   General bed mobility comments: cuing to move BLE to EOB & for hand placement    Transfers                        Ambulation/Gait                  Stairs            Wheelchair Mobility    Modified Rankin (Stroke Patients Only)       Balance Overall balance assessment: Needs assistance Sitting-balance support: Feet supported;Bilateral upper extremity supported Sitting balance-Leahy Scale: Fair Sitting balance - Comments: distant supervision static sitting                                     Pertinent Vitals/Pain Pain Assessment: Faces Faces Pain Scale: Hurts even more Pain Location: RLE Pain Descriptors / Indicators: Grimacing;Sore Pain Intervention(s): Limited activity within patient's tolerance;Monitored during session;Repositioned    Home Living  Family/patient expects to be discharged to:: Private residence Living Arrangements: Spouse/significant other;Children;Parent Available Help at Discharge: Family;Available 24 hours/day Type of Home: House Home Access: Ramped entrance       Home Layout: Multi-level;Able to live on main level with bedroom/bathroom   Additional Comments: hospital bed, grab bars on wall, w/c, lift chair (got rid of hoyer lift)    Prior Function               Mobility Comments: Pt was working  with HHPT from Feb-June/July 2022 & was able to ambulate with RW until R knee pain began. Since then pt has been primarily sleeping/staying in his lift chair (cannot tolerate lying in hospital bed). Wife reports he is able to use grab bars, lift chair +2 family assistance to transfer lift chair>w/c & they bathe him outside with the water hose.       Hand Dominance        Extremity/Trunk Assessment   Upper Extremity Assessment Upper Extremity Assessment: Generalized weakness    Lower Extremity Assessment Lower Extremity Assessment: Generalized weakness       Communication   Communication:  (broken speech as pt with difficulty breathing at times)  Cognition Arousal/Alertness: Awake/alert Behavior During Therapy: WFL for tasks assessed/performed Overall Cognitive Status: Within Functional Limits for tasks assessed                                          General Comments      Exercises     Assessment/Plan    PT Assessment Patient needs continued PT services  PT Problem List Decreased strength;Decreased mobility;Obesity;Decreased range of motion;Decreased activity tolerance;Cardiopulmonary status limiting activity;Pain;Decreased skin integrity;Decreased knowledge of use of DME;Decreased balance       PT Treatment Interventions DME instruction;Therapeutic activities;Modalities;Gait training;Therapeutic exercise;Patient/family education;Stair training;Balance training;Functional mobility training;Manual techniques;Neuromuscular re-education;Wheelchair mobility training    PT Goals (Current goals can be found in the Care Plan section)  Acute Rehab PT Goals Patient Stated Goal: get better PT Goal Formulation: With patient/family Time For Goal Achievement: 07/28/21 Potential to Achieve Goals: Fair    Frequency Min 2X/week   Barriers to discharge        Co-evaluation               AM-PAC PT "6 Clicks" Mobility  Outcome Measure Help needed  turning from your back to your side while in a flat bed without using bedrails?: Total Help needed moving from lying on your back to sitting on the side of a flat bed without using bedrails?: Total Help needed moving to and from a bed to a chair (including a wheelchair)?: Total Help needed standing up from a chair using your arms (e.g., wheelchair or bedside chair)?: Total Help needed to walk in hospital room?: Total Help needed climbing 3-5 steps with a railing? : Total 6 Click Score: 6    End of Session Equipment Utilized During Treatment: Oxygen Activity Tolerance: Patient tolerated treatment well Patient left: with call bell/phone within reach;with bed alarm set;with family/visitor present Nurse Communication: Mobility status PT Visit Diagnosis: Unsteadiness on feet (R26.81);Muscle weakness (generalized) (M62.81);Difficulty in walking, not elsewhere classified (R26.2);Pain Pain - Right/Left: Right Pain - part of body: Knee    Time: 1012-1046 PT Time Calculation (min) (ACUTE ONLY): 34 min   Charges:   PT Evaluation $PT Eval Moderate Complexity: 1 Mod PT Treatments $Therapeutic Activity: 23-37 mins  Lavone Nian, PT, DPT 07/14/21, 1:00 PM   Waunita Schooner 07/14/2021, 12:55 PM

## 2021-07-14 NOTE — Progress Notes (Signed)
PHARMACIST - PHYSICIAN COMMUNICATION  CONCERNING:  Enoxaparin (Lovenox) for DVT Prophylaxis    RECOMMENDATION: Patient was prescribed enoxaprin 40mg  q24 hours for VTE prophylaxis.   Filed Weights   07/11/21 0500 07/12/21 0500 07/13/21 0500  Weight: (!) 144.9 kg (319 lb 7.1 oz) (!) 145.1 kg (319 lb 14.2 oz) (!) 145.1 kg (319 lb 14.2 oz)    Body mass index is 42.2 kg/m.  Estimated Creatinine Clearance: 198.8 mL/min (A) (by C-G formula based on SCr of 0.34 mg/dL (L)).   Based on [facility] patient is candidate for enoxaparin 0.5mg /kg TBW SQ every 24 hours based on BMI being >30.  DESCRIPTION: Pharmacy has adjusted enoxaparin dose per [facility] policy.  Patient is now receiving enoxaparin 72.5 mg every 24 hours    Sherilyn Banker, PharmD Clinical Pharmacist  07/14/2021 7:05 PM

## 2021-07-14 NOTE — Progress Notes (Signed)
At bedside to place midline. No suitable veins found on left arm to place midline due to edema and cephalic vein is not compressible. Midline placed in right cephalic vein.

## 2021-07-14 NOTE — Progress Notes (Signed)
This RN called in to room by wife, patient complaining of knee pain. While in the room, patient started expressing great discomfort/burning/spicy feeling at IV site where continuous nafcillin was running, left hand/wrist PIV.  RN flushed right hand/wrist PIV with no difficulty and switched infusion to that PIV.  Reported issue to charge RN, suggested to call pharmacy, NP, and IV team. Corene Cornea, Pharmacy suggested it might be a delayed allergic reaction due to amoxicillin allergy, suggested speaking with ICU provider coverage. Infusion was turned off. Spoke with Benjamine Mola, ICU NP, she does not believe it is an allergic reaction due to lack of systemic reaction and presentation of pain. Suggested reaching out to ID for a change in antibiotic and continue to monitor.   IV team consult entered, awaiting response.

## 2021-07-14 NOTE — Progress Notes (Signed)
PHARMACY CONSULT NOTE - FOLLOW UP  Pharmacy Consult for Electrolyte Monitoring and Replacement   Recent Labs: Potassium (mmol/L)  Date Value  07/14/2021 3.2 (L)   Magnesium (mg/dL)  Date Value  07/14/2021 2.1   Calcium (mg/dL)  Date Value  07/14/2021 8.6 (L)   Albumin (g/dL)  Date Value  07/12/2021 2.6 (L)   Phosphorus (mg/dL)  Date Value  07/14/2021 2.9   Sodium (mmol/L)  Date Value  07/14/2021 138     Assessment: 30YOM presenting with shortness of breath and sepsis. Currently intubated. Found to have MSSA bacteremia. PMH includes PE and MI.  Goal of Therapy:  Electrolytes WNL  Plan:  K 3.2 Replete with 20mEq PO x2 doses.  F/u with AM labs.    Lorna Dibble, PharmD, [facility] Clinical Pharmacist 07/14/2021 10:47 AM

## 2021-07-15 DIAGNOSIS — J9621 Acute and chronic respiratory failure with hypoxia: Secondary | ICD-10-CM | POA: Diagnosis not present

## 2021-07-15 LAB — BASIC METABOLIC PANEL
Anion gap: 11 (ref 5–15)
BUN: <5 mg/dL — ABNORMAL LOW (ref 6–20)
CO2: 43 mmol/L — ABNORMAL HIGH (ref 22–32)
Calcium: 8.9 mg/dL (ref 8.9–10.3)
Chloride: 87 mmol/L — ABNORMAL LOW (ref 98–111)
Creatinine, Ser: 0.34 mg/dL — ABNORMAL LOW (ref 0.61–1.24)
GFR, Estimated: >60 mL/min (ref >60)
Glucose, Bld: 155 mg/dL — ABNORMAL HIGH (ref 70–99)
Potassium: 3.4 mmol/L — ABNORMAL LOW (ref 3.5–5.1)
Sodium: 141 mmol/L (ref 135–145)

## 2021-07-15 LAB — GLUCOSE, CAPILLARY
Glucose-Capillary: 200 mg/dL — ABNORMAL HIGH (ref 70–99)
Glucose-Capillary: 253 mg/dL — ABNORMAL HIGH (ref 70–99)
Glucose-Capillary: 241 mg/dL — ABNORMAL HIGH (ref 70–99)
Glucose-Capillary: 204 mg/dL — ABNORMAL HIGH (ref 70–99)

## 2021-07-15 LAB — CBC
HCT: 30.2 % — ABNORMAL LOW (ref 39.0–52.0)
Hemoglobin: 8.8 g/dL — ABNORMAL LOW (ref 13.0–17.0)
MCH: 28.6 pg (ref 26.0–34.0)
MCHC: 29.1 g/dL — ABNORMAL LOW (ref 30.0–36.0)
MCV: 98.1 fL (ref 80.0–100.0)
Platelets: 254 10*3/uL (ref 150–400)
RBC: 3.08 MIL/uL — ABNORMAL LOW (ref 4.22–5.81)
RDW: 18.1 % — ABNORMAL HIGH (ref 11.5–15.5)
WBC: 9.4 10*3/uL (ref 4.0–10.5)
nRBC: 0.2 % (ref 0.0–0.2)

## 2021-07-15 LAB — PROCALCITONIN: Procalcitonin: 0.17 ng/mL

## 2021-07-15 LAB — MAGNESIUM: Magnesium: 1.8 mg/dL (ref 1.7–2.4)

## 2021-07-15 MED ORDER — SALINE SPRAY 0.65 % NA SOLN
1.0000 | NASAL | Status: DC | PRN
  Administered 2021-07-15: 1 via NASAL
  Filled 2021-07-15 (×2): qty 44

## 2021-07-15 MED ORDER — BIOTENE DRY MOUTH MT LIQD: 15.0000 mL | OROMUCOSAL | Status: DC | PRN

## 2021-07-15 MED ORDER — FUROSEMIDE 10 MG/ML IJ SOLN
40.0000 mg | Freq: Two times a day (BID) | INTRAMUSCULAR | Status: DC
Start: 2021-07-15 — End: 2021-07-18
  Administered 2021-07-15 – 2021-07-17 (×6): 40 mg via INTRAVENOUS
  Filled 2021-07-15 (×7): qty 4

## 2021-07-15 MED ORDER — INSULIN ASPART 100 UNIT/ML IJ SOLN
3.0000 [IU] | Freq: Three times a day (TID) | INTRAMUSCULAR | Status: DC
Start: 2021-07-16 — End: 2021-07-16
  Administered 2021-07-16 (×3): 3 [IU] via SUBCUTANEOUS
  Filled 2021-07-15 (×3): qty 1

## 2021-07-15 MED ORDER — INSULIN GLARGINE-YFGN 100 UNIT/ML ~~LOC~~ SOLN
18.0000 [IU] | Freq: Two times a day (BID) | SUBCUTANEOUS | Status: DC
  Administered 2021-07-15 – 2021-07-17 (×5): 18 [IU] via SUBCUTANEOUS
  Filled 2021-07-15 (×7): qty 0.18

## 2021-07-15 MED ORDER — POTASSIUM CHLORIDE CRYS ER 20 MEQ PO TBCR
40.0000 meq | EXTENDED_RELEASE_TABLET | ORAL | Status: AC
  Administered 2021-07-15 (×2): 40 meq via ORAL
  Filled 2021-07-15 (×2): qty 2

## 2021-07-15 MED ORDER — CYCLOBENZAPRINE HCL 10 MG PO TABS
10.0000 mg | ORAL_TABLET | Freq: Three times a day (TID) | ORAL | Status: DC | PRN
  Administered 2021-07-17: 11:00:00 10 mg via ORAL
  Filled 2021-07-15: qty 1

## 2021-07-15 MED ORDER — NYSTATIN 100000 UNIT/GM EX POWD
Freq: Three times a day (TID) | CUTANEOUS | Status: DC
  Filled 2021-07-15 (×2): qty 15

## 2021-07-15 MED ORDER — MAGNESIUM SULFATE 2 GM/50ML IV SOLN
2.0000 g | Freq: Once | INTRAVENOUS | Status: AC
  Administered 2021-07-15: 2 g via INTRAVENOUS
  Filled 2021-07-15: qty 50

## 2021-07-15 MED ORDER — POTASSIUM CHLORIDE CRYS ER 20 MEQ PO TBCR
40.0000 meq | EXTENDED_RELEASE_TABLET | ORAL | Status: AC
  Administered 2021-07-15 – 2021-07-16 (×2): 40 meq via ORAL
  Filled 2021-07-15 (×2): qty 2

## 2021-07-15 NOTE — Progress Notes (Signed)
Subjective: 4 Days Post-Op Procedure(s) (LRB): SYNOVIAL BIOPSY AND ARTHROSCOPIC IRRGATION AND DEBRIDEMENT OF SEPTIC KNEE (Right) SYNOVIAL BIOPSY (Right) Reports mild pain in the right knee at this time, more severe after patient was transferred over to his bed. Denies any N/V this morning. Currently on IV Nafcillin  Objective: Vital signs in last 24 hours: Temp:  [97.7 F (36.5 C)-99 F (37.2 C)] 99 F (37.2 C) (11/26 0809) Pulse Rate:  [103-113] 109 (11/26 0347) Resp:  [17-37] 20 (11/26 0809) BP: (113-158)/(44-75) 134/60 (11/26 0809) SpO2:  [93 %-100 %] 98 % (11/26 0809) Weight:  [145.7 kg] 145.7 kg (11/26 0600)  Intake/Output from previous day: 11/25 0701 - 11/26 0700 In: 200.3 [IV Piggyback:200.3] Out: 4950 [Urine:4900; Stool:50] Intake/Output this shift: No intake/output data recorded.  Recent Labs    07/13/21 0546 07/14/21 0702 07/15/21 0641  HGB 9.0* 8.7* 8.8*   Recent Labs    07/14/21 0702 07/15/21 0641  WBC 9.3 9.4  RBC 3.15* 3.08*  HCT 30.7* 30.2*  PLT 254 254   Recent Labs    07/14/21 0702 07/15/21 0641  NA 138 141  K 3.2* 3.4*  CL 85* 87*  CO2 45* 43*  BUN 6 <5*  CREATININE 0.34* 0.34*  GLUCOSE 155* 155*  CALCIUM 8.6* 8.9   No results for input(s): LABPT, INR in the last 72 hours.  EXAM General - Patient is well, oriented and appropriate. Right lower Extremity - ACE wrap intact to the right leg. Honeycomb dressing with mild drainage, slight purulence.  New honeycomb dressing applied Moderate lymphadenopathy to bilateral legs.  Wound to the dorsal aspect of the right foot. He is able to dorsiflex and plantarflex the foot. Intact to light touch to bilateral legs.  Past Medical History:  Diagnosis Date   Gout    Hypertension    Rupture of bowel (HCC)     Assessment/Plan:   4 Days Post-Op Procedure(s) (LRB): SYNOVIAL BIOPSY AND ARTHROSCOPIC IRRGATION AND DEBRIDEMENT OF SEPTIC KNEE (Right) SYNOVIAL BIOPSY (Right) Principal Problem:    Acute on chronic respiratory failure with hypoxemia (HCC) Active Problems:   Colostomy status (HCC)   Pressure injury of skin  Estimated body mass index is 42.38 kg/m as calculated from the following:   Height as of this encounter: 6\' 1"  (1.854 m).   Weight as of this encounter: 145.7 kg.   Cultures demonstrated staph aureus  Currently on Nafcillin. Gram stain with rare WBC, no organisms grown yet.  Honeycomb dressings re-applied. Up with therapy. Progress activities as tolerated.  DVT Prophylaxis - Lovenox  J. Cameron Proud, PA-C [place] 07/15/2021, 9:10 AM

## 2021-07-15 NOTE — Progress Notes (Signed)
PROGRESS NOTE    Craig [name]  GBT:[id number] DOB: [birth year] DOA: 07/08/2021 PCP: [name], MD  [id number]   Assessment & Plan:   Principal Problem:   Acute on chronic respiratory failure with hypoxemia Craig [name]) Active Problems:   Colostomy status ([place])   Pressure injury of skin    Craig [name] is a 30 y.o. male with significant PMH of extensive medical history including inflammatory arthritis on methotrexate and chronic prednisone at 10 mg daily, prior severe Covid-19 infection requiring prolonged hospitalization and tracheostomy (since decannulated) Dec 2021 - Feb 2022, chronic hypoxic and hypercarbic respiratory failure on 2L O2 PRN, MSSA bacteremia, MSSA pneumonia, perforated diverticulitis s/p partial colectomy with ostomy creation, morbid obesity and gout who presented to the ED with chief complaints of progressive shortness of breath. Patient is currently intubated, history mostly obtained from patient's chart and patient's wife who is currently at the bedside.  Per patient's wife, EMS was called today due to worsening shortness of breath and intermittent altered mental status.  [name] stated the patient has been complaining of right knee pain was prescribed Percocet on Monday.  She took the Percocet at Monday and noticed worsening shortness of breath with mental status change per wife.  He also takes tramadol for pain.  No reports of chest pain, nausea vomiting, abdominal pain, diaphoresis, fevers or chills, cough or any other focal neurological symptoms or deficit.  On EMS arrival patient was noted to be hypoxic with a heart rate between 140 to 150 bpm.  He was placed on 6 L to maintain SPO2 greater than 90% and transported to the ED. In the ED, pt was noted to have seizure-like activity.  Pt was intubated, and PCCM consulted for admission and further management.  Pt was transferred to hospitalist service on 07/14/21.   Acute on chronic hypoxic and  hypercarbic respiratory failure On chronic 2L home O2 - Extubated 11/23.  Currently on 5L [place]. --Continue supplemental O2 to keep sats >=90%, wean as tolerated  Sepsis 2/2  MSSA bacteremia  Right septic joint s/p I/D on 07/11/21 -Infectious Disease consulted -s/p TEE - no veg -Repeat blood cultures 72h to clear Plan: --cont nafcillin --pain control for right knee --ortho to follow   Shock -RESOLVED  - Favor sedation/analgesia related hypotension rather than septic. --s/p pressors --cont home prednisone (for rheumatoid arthritis); no indication for stress dose steroids at this time  Acute toxic metabolic encephalopathy 2/2 sepsis  -CT Head wnl -Supportive care -Neurology consulted   Seizure Like Activity- RESOLVED -CT Head wnl -Loaded with Keppra 1500 mg x 1 -Stop Keppra; monitor for recurrence of seizure activity, per neuro   H/o upper extremity DVT It appears that he was started on Xarelto before around Feb/Mar 2022 for provoked upper extremity DVT. He has completed an appropriate course of anticoagulation at this time and therapeutic anticoagulation will be discontinued. CTA chest did not reveal PE on admission.   DM2, well controlled --A1c 6.8 --increase glargine to 18u BID --add mealtime 3u TID --SSI TID  Probable OSA --look into getting pt qualified for CPAP --order CPAP nightly while inpatient  Anasarca --cont IV lasix 40 mg BID --strict I/O   DVT prophylaxis: Lovenox SQ Code Status: Full code  Family Communication:  Level of care: Telemetry Surgical Dispo:   The patient is from: home Anticipated d/c is to: SNF Anticipated d/c date is: >3 days Patient currently is not medically ready to d/c due to: on 5L O2, needing aggressive diuresis  due to anasarca   Subjective and Interval History:  Pt has been very sleepy since starting flexeril.  Wife reporting pt intermittently confused.  Complained of sore knee and dry nose.     Objective: Vitals:    07/15/21 0347 07/15/21 0600 07/15/21 0809 07/15/21 1611  BP: (!) 141/62  134/60 (!) 135/53  Pulse: (!) 109   (!) 108  Resp: '20  20 20  ' Temp: 98.8 F (37.1 C)  99 F (37.2 C) 98.4 F (36.9 C)  TempSrc:   Oral   SpO2: 100%  98% 100%  Weight:  (!) 145.7 kg    Height:        Intake/Output Summary (Last 24 hours) at 07/15/2021 1627 Last data filed at 07/15/2021 1500 Gross per 24 hour  Intake 273.83 ml  Output 5600 ml  Net -5326.17 ml   Filed Weights   07/12/21 0500 07/13/21 0500 07/15/21 0600  Weight: (!) 145.1 kg (!) 145.1 kg (!) 145.7 kg    Examination:   Constitutional: NAD, sleepy but arousable and oriented HEENT: conjunctivae and lids normal, EOMI CV: No cyanosis.   RESP: increased respiratory effort Extremities: edema in BLE, R>L SKIN: warm, dry Foley present   Data Reviewed: I have personally reviewed following labs and imaging studies  CBC: Recent Labs  Lab 07/11/21 0605 07/12/21 0635 07/13/21 0546 07/14/21 0702 07/15/21 0641  WBC 8.9 7.7 8.8 9.3 9.4  HGB 9.6* 8.3* 9.0* 8.7* 8.8*  HCT 32.6* 27.7* 31.4* 30.7* 30.2*  MCV 94.8 93.9 95.7 97.5 98.1  PLT 191 191 235 254 154   Basic Metabolic Panel: Recent Labs  Lab 07/10/21 0528 07/11/21 0605 07/12/21 0635 07/13/21 0409 07/14/21 0702 07/15/21 0641  NA 138 137 137 140 138 141  K 3.5 3.5 3.1* 4.2 3.2* 3.4*  CL 91* 89* 87* 89* 85* 87*  CO2 40* 38* 43* 40* 45* 43*  GLUCOSE 263* 191* 104* 155* 155* 155*  BUN '11 9 7 7 6 ' <5*  CREATININE 0.52* 0.60* 0.57* 0.48* 0.34* 0.34*  CALCIUM 8.6* 9.0 8.3* 8.6* 8.6* 8.9  MG 2.2 1.9 1.8 1.7 2.1 1.8  PHOS 3.5 5.2* 2.8 4.2 2.9  --    GFR: Estimated Creatinine Clearance: 199.1 mL/min (A) (by C-G formula based on SCr of 0.34 mg/dL (L)). Liver Function Tests: Recent Labs  Lab 07/12/21 0626  AST 15  ALT 24  ALKPHOS 42  BILITOT 0.9  PROT 5.5*  ALBUMIN 2.6*   No results for input(s): LIPASE, AMYLASE in the last 168 hours. No results for input(s): AMMONIA in the  last 168 hours. Coagulation Profile: Recent Labs  Lab 07/08/21 1829  INR 1.0   Cardiac Enzymes: No results for input(s): CKTOTAL, CKMB, CKMBINDEX, TROPONINI in the last 168 hours. BNP (last 3 results) No results for input(s): PROBNP in the last 8760 hours. HbA1C: No results for input(s): HGBA1C in the last 72 hours. CBG: Recent Labs  Lab 07/14/21 1125 07/14/21 1543 07/14/21 2343 07/15/21 0849 07/15/21 1234  GLUCAP 194* 234* 174* 200* 253*   Lipid Profile: No results for input(s): CHOL, HDL, LDLCALC, TRIG, CHOLHDL, LDLDIRECT in the last 72 hours.  Thyroid Function Tests: No results for input(s): TSH, T4TOTAL, FREET4, T3FREE, THYROIDAB in the last 72 hours. Anemia Panel: No results for input(s): VITAMINB12, FOLATE, FERRITIN, TIBC, IRON, RETICCTPCT in the last 72 hours. Sepsis Labs: Recent Labs  Lab 07/09/21 0129 07/15/21 0641  PROCALCITON 1.18 0.17    Recent Results (from the past 240 hour(s))  Blood Culture (routine  x 2)     Status: Abnormal   Collection Time: 07/08/21  8:21 AM   Specimen: BLOOD  Result Value Ref Range Status   Specimen Description   Final    BLOOD RIGHT ANTECUBITAL Performed at [facility], [address], [place], [place] [address]    Special Requests   Final    BOTTLES DRAWN AEROBIC AND ANAEROBIC Blood Culture results may not be optimal due to an excessive volume of blood received in culture bottles Performed at [name], [address] [place], [place] [address]    Culture  Setup Time   Final    Organism ID to follow [place] AND ANAEROBIC BOTTLES CRITICAL RESULT CALLED TO, READ BACK BY AND VERIFIED WITH: NATHAN BELEU AT 0230 07/09/2021 DLB Performed at [name], [address] [place], [place] [address]    Culture STAPHYLOCOCCUS AUREUS (A)  Final   Report Status 07/11/2021 FINAL  Final   Organism ID, Bacteria STAPHYLOCOCCUS AUREUS  Final      Susceptibility    Staphylococcus aureus - MIC*    CIPROFLOXACIN 1 SENSITIVE Sensitive     ERYTHROMYCIN RESISTANT Resistant     GENTAMICIN <=0.5 SENSITIVE Sensitive     OXACILLIN <=0.25 SENSITIVE Sensitive     TETRACYCLINE <=1 SENSITIVE Sensitive     VANCOMYCIN <=0.5 SENSITIVE Sensitive     TRIMETH/SULFA <=10 SENSITIVE Sensitive     CLINDAMYCIN RESISTANT Resistant     RIFAMPIN <=0.5 SENSITIVE Sensitive     Inducible Clindamycin POSITIVE Resistant     * STAPHYLOCOCCUS AUREUS  Blood Culture (routine x 2)     Status: Abnormal   Collection Time: 07/08/21  8:21 AM   Specimen: BLOOD  Result Value Ref Range Status   Specimen Description   Final    BLOOD BLOOD RIGHT FOREARM Performed at [facility], [address], [place], [place] [address]    Special Requests   Final    BOTTLES DRAWN AEROBIC AND ANAEROBIC Blood Culture results may not be optimal due to an excessive volume of blood received in culture bottles Performed at [name], [address] [place], [place] [address]    Culture  Setup Time   Final    GRAM POSITIVE COCCI ANAEROBIC BOTTLE ONLY CRITICAL VALUE NOTED.  VALUE IS CONSISTENT WITH PREVIOUSLY REPORTED AND CALLED VALUE. Performed at [facility], [address] [place], [place] [address]    Culture (A)  Final    STAPHYLOCOCCUS AUREUS SUSCEPTIBILITIES PERFORMED ON PREVIOUS CULTURE WITHIN THE LAST 5 DAYS. Performed at [name], [place] [address] [place], [place] [address]    Report Status 07/12/2021 FINAL  Final  Blood Culture ID Panel (Reflexed)     Status: Abnormal   Collection Time: 07/08/21  8:21 AM  Result Value Ref Range Status   Enterococcus faecalis NOT DETECTED NOT DETECTED Final   Enterococcus Faecium NOT DETECTED NOT DETECTED Final   Listeria monocytogenes NOT DETECTED NOT DETECTED Final   Staphylococcus species DETECTED (A) NOT DETECTED Final    Comment: CRITICAL RESULT CALLED TO, READ BACK BY AND VERIFIED WITH: NATHAN BELEU AT 0230  07/09/2021 DLB    Staphylococcus aureus (BCID) DETECTED (A) NOT DETECTED Final    Comment: CRITICAL RESULT CALLED TO, READ BACK BY AND VERIFIED WITH: NATHAN BELEU AT 0230 07/09/2021 DLB    Staphylococcus epidermidis NOT DETECTED NOT DETECTED Final   Staphylococcus lugdunensis NOT DETECTED NOT DETECTED Final   Streptococcus  species NOT DETECTED NOT DETECTED Final   Streptococcus agalactiae NOT DETECTED NOT DETECTED Final   Streptococcus pneumoniae NOT DETECTED NOT DETECTED Final   Streptococcus pyogenes NOT DETECTED NOT DETECTED Final   A.calcoaceticus-baumannii NOT DETECTED NOT DETECTED Final   Bacteroides fragilis NOT DETECTED NOT DETECTED Final   Enterobacterales NOT DETECTED NOT DETECTED Final   Enterobacter cloacae complex NOT DETECTED NOT DETECTED Final   Escherichia coli NOT DETECTED NOT DETECTED Final   Klebsiella aerogenes NOT DETECTED NOT DETECTED Final   Klebsiella oxytoca NOT DETECTED NOT DETECTED Final   Klebsiella pneumoniae NOT DETECTED NOT DETECTED Final   Proteus species NOT DETECTED NOT DETECTED Final   Salmonella species NOT DETECTED NOT DETECTED Final   Serratia marcescens NOT DETECTED NOT DETECTED Final   Haemophilus influenzae NOT DETECTED NOT DETECTED Final   Neisseria meningitidis NOT DETECTED NOT DETECTED Final   Pseudomonas aeruginosa NOT DETECTED NOT DETECTED Final   Stenotrophomonas maltophilia NOT DETECTED NOT DETECTED Final   Candida albicans NOT DETECTED NOT DETECTED Final   Candida auris NOT DETECTED NOT DETECTED Final   Candida glabrata NOT DETECTED NOT DETECTED Final   Candida krusei NOT DETECTED NOT DETECTED Final   Candida parapsilosis NOT DETECTED NOT DETECTED Final   Candida tropicalis NOT DETECTED NOT DETECTED Final   Cryptococcus neoformans/gattii NOT DETECTED NOT DETECTED Final   Meth resistant mecA/C and MREJ NOT DETECTED NOT DETECTED Final    Comment: Performed at [name], [address] [place],  [address]  Resp  Panel by RT-PCR (Flu A&B, Covid) Nasopharyngeal Swab     Status: None   Collection Time: 07/08/21  1:59 PM   Specimen: Nasopharyngeal Swab; Nasopharyngeal(NP) swabs in vial transport medium  Result Value Ref Range Status   SARS Coronavirus 2 by RT PCR NEGATIVE NEGATIVE Final    Comment: (NOTE) SARS-CoV-2 target nucleic acids are NOT DETECTED.  The SARS-CoV-2 RNA is generally detectable in upper respiratory specimens during the acute phase of infection. The lowest concentration of SARS-CoV-2 viral copies this assay can detect is 138 copies/mL. A negative result does not preclude SARS-Cov-2 infection and should not be used as the sole basis for treatment or other patient management decisions. A negative result may occur with  improper specimen collection/handling, submission of specimen other than nasopharyngeal swab, presence of viral mutation(s) within the areas targeted by this assay, and inadequate number of viral copies(<138 copies/mL). A negative result must be combined with clinical observations, patient history, and epidemiological information. The expected result is Negative.  Fact Sheet for Patients:  EntrepreneurPulse.com.au  Fact Sheet for Healthcare Providers:  IncredibleEmployment.be  This test is no t yet approved or cleared by the [place] FDA and  has been authorized for detection and/or diagnosis of SARS-CoV-2 by FDA under an Emergency Use Authorization (EUA). This EUA will remain  in effect (meaning this test can be used) for the duration of the COVID-19 declaration under Section 564(b)(1) of the Act, 21 U.S.C.section 360bbb-3(b)(1), unless the authorization is terminated  or revoked sooner.       Influenza A by PCR NEGATIVE NEGATIVE Final   Influenza B by PCR NEGATIVE NEGATIVE Final    Comment: (NOTE) The Xpert Xpress SARS-CoV-2/FLU/RSV plus assay is intended as an aid in the diagnosis of influenza from Nasopharyngeal  swab specimens and should not be used as a sole basis for treatment. Nasal washings and aspirates are unacceptable for Xpert Xpress SARS-CoV-2/FLU/RSV testing.  Fact Sheet for Patients: EntrepreneurPulse.com.au  Fact Sheet for Healthcare  Providers: IncredibleEmployment.be  This test is not yet approved or cleared by the [place] and has been authorized for detection and/or diagnosis of SARS-CoV-2 by FDA under an Emergency Use Authorization (EUA). This EUA will remain in effect (meaning this test can be used) for the duration of the COVID-19 declaration under Section 564(b)(1) of the Act, 21 U.S.C. section 360bbb-3(b)(1), unless the authorization is terminated or revoked.  Performed at [facility], [address] [place], [place] [address]   MRSA Next Gen by PCR, Nasal     Status: None   Collection Time: 07/08/21  4:24 PM   Specimen: Nasal Mucosa; Nasal Swab  Result Value Ref Range Status   MRSA by PCR Next Gen NOT DETECTED NOT DETECTED Final    Comment: (NOTE) The GeneXpert MRSA Assay (FDA approved for NASAL specimens only), is one component of a comprehensive MRSA colonization surveillance program. It is not intended to diagnose MRSA infection nor to guide or monitor treatment for MRSA infections. Test performance is not FDA approved in patients less than 68 years old. Performed at [facility], [place], [place] [address]   Respiratory (~20 pathogens) panel by PCR     Status: None   Collection Time: 07/08/21  5:30 PM   Specimen: Nasopharyngeal Swab; Respiratory  Result Value Ref Range Status   Adenovirus NOT DETECTED NOT DETECTED Final   Coronavirus 229E NOT DETECTED NOT DETECTED Final    Comment: (NOTE) The Coronavirus on the Respiratory Panel, DOES NOT test for the novel  Coronavirus (2019 nCoV)    Coronavirus HKU1 NOT DETECTED NOT DETECTED Final   Coronavirus NL63 NOT DETECTED NOT  DETECTED Final   Coronavirus OC43 NOT DETECTED NOT DETECTED Final   Metapneumovirus NOT DETECTED NOT DETECTED Final   Rhinovirus / Enterovirus NOT DETECTED NOT DETECTED Final   Influenza A NOT DETECTED NOT DETECTED Final   Influenza B NOT DETECTED NOT DETECTED Final   Parainfluenza Virus 1 NOT DETECTED NOT DETECTED Final   Parainfluenza Virus 2 NOT DETECTED NOT DETECTED Final   Parainfluenza Virus 3 NOT DETECTED NOT DETECTED Final   Parainfluenza Virus 4 NOT DETECTED NOT DETECTED Final   Respiratory Syncytial Virus NOT DETECTED NOT DETECTED Final   Bordetella pertussis NOT DETECTED NOT DETECTED Final   Bordetella Parapertussis NOT DETECTED NOT DETECTED Final   Chlamydophila pneumoniae NOT DETECTED NOT DETECTED Final   Mycoplasma pneumoniae NOT DETECTED NOT DETECTED Final    Comment: Performed at [facility], [place]. [address] [place], [place] [address]  CULTURE, BLOOD (ROUTINE X 2) w Reflex to ID Panel     Status: None   Collection Time: 07/09/21  2:39 PM   Specimen: BLOOD  Result Value Ref Range Status   Specimen Description BLOOD THUMB R  Final   Special Requests   Final    BOTTLES DRAWN AEROBIC AND ANAEROBIC Blood Culture results may not be optimal due to an inadequate volume of blood received in culture bottles   Culture   Final    NO GROWTH 5 DAYS Performed at [name], [address] [place], [place] [address]    Report Status 07/14/2021 FINAL  Final  CULTURE, BLOOD (ROUTINE X 2) w Reflex to ID Panel     Status: None   Collection Time: 07/09/21  2:50 PM   Specimen: BLOOD RIGHT HAND  Result Value Ref Range Status   Specimen Description BLOOD RIGHT HAND  Final   Special Requests  Final    BOTTLES DRAWN AEROBIC AND ANAEROBIC Blood Culture adequate volume   Culture   Final    NO GROWTH 5 DAYS Performed at [facility], [address] [place], [place] [address]    Report Status 07/14/2021 FINAL  Final  Aerobic/Anaerobic Culture w Gram Stain  (surgical/deep wound)     Status: None (Preliminary result)   Collection Time: 07/11/21  3:29 PM   Specimen: PATH Other; Tissue  Result Value Ref Range Status   Specimen Description   Final    SYNOVIAL Performed at [facility], [address] [place], [place] [address]    Special Requests   Final    RIGHT KNEE Performed at [name], [address], [place], [place] [address]    Gram Stain   Final    FEW WBC PRESENT, PREDOMINANTLY MONONUCLEAR FEW GRAM POSITIVE COCCI Performed at [name], [place] [address], [place], [place] [address]    Culture   Final    RARE STAPHYLOCOCCUS AUREUS NO ANAEROBES ISOLATED; CULTURE IN PROGRESS FOR 5 DAYS    Report Status PENDING  Incomplete   Organism ID, Bacteria STAPHYLOCOCCUS AUREUS  Final      Susceptibility   Staphylococcus aureus - MIC*    CIPROFLOXACIN <=0.5 SENSITIVE Sensitive     ERYTHROMYCIN RESISTANT Resistant     GENTAMICIN <=0.5 SENSITIVE Sensitive     OXACILLIN <=0.25 SENSITIVE Sensitive     TETRACYCLINE <=1 SENSITIVE Sensitive     VANCOMYCIN 1 SENSITIVE Sensitive     TRIMETH/SULFA <=10 SENSITIVE Sensitive     CLINDAMYCIN RESISTANT Resistant     RIFAMPIN <=0.5 SENSITIVE Sensitive     Inducible Clindamycin POSITIVE Resistant     * RARE STAPHYLOCOCCUS AUREUS  Aerobic/Anaerobic Culture w Gram Stain (surgical/deep wound)     Status: None (Preliminary result)   Collection Time: 07/11/21  3:29 PM   Specimen: PATH Other; Tissue  Result Value Ref Range Status   Specimen Description SYNOVIAL  Final   Special Requests RIGHT KNEE SYNOVIAL FLUID 2  Final   Gram Stain   Final    ABUNDANT WBC PRESENT,BOTH PMN AND MONONUCLEAR MODERATE GRAM POSITIVE COCCI    Culture   Final    MODERATE STAPHYLOCOCCUS AUREUS NO ANAEROBES ISOLATED; CULTURE IN PROGRESS FOR 5 DAYS    Report Status PENDING  Incomplete   Organism ID, Bacteria STAPHYLOCOCCUS AUREUS  Final      Susceptibility   Staphylococcus aureus - MIC*     CIPROFLOXACIN <=0.5 SENSITIVE Sensitive     ERYTHROMYCIN RESISTANT Resistant     GENTAMICIN <=0.5 SENSITIVE Sensitive     OXACILLIN <=0.25 SENSITIVE Sensitive     TETRACYCLINE <=1 SENSITIVE Sensitive     VANCOMYCIN 1 SENSITIVE Sensitive     TRIMETH/SULFA <=10 SENSITIVE Sensitive     CLINDAMYCIN RESISTANT Resistant     RIFAMPIN <=0.5 SENSITIVE Sensitive     Inducible Clindamycin Value in next row Resistant      POSITIVEPerformed at [facility], [address] [place], [place] [address]    * MODERATE STAPHYLOCOCCUS AUREUS  Aerobic/Anaerobic Culture w Gram Stain (surgical/deep wound)     Status: None (Preliminary result)   Collection Time: 07/11/21  3:32 PM   Specimen: PATH Other; Tissue  Result Value Ref Range Status   Specimen Description   Final    SYNOVIAL Performed at [name], [address], [place], [place] [address]    Special Requests   Final  RIGHT KNEE Performed at [facility], [address] [place], [place] [address]    Gram Stain   Final    RARE WBC PRESENT, PREDOMINANTLY MONONUCLEAR NO ORGANISMS SEEN    Culture   Final    NO GROWTH 4 DAYS NO ANAEROBES ISOLATED; CULTURE IN PROGRESS FOR 5 DAYS Performed at [place] [address] [place], [place] [address]    Report Status PENDING  Incomplete  Acid Fast Smear (AFB)     Status: None   Collection Time: 07/11/21  3:32 PM  Result Value Ref Range Status   AFB Specimen Processing Concentration  Final   Acid Fast Smear Negative  Final    Comment: (NOTE) Performed At: [facility] [place], [place] [id number] Rush Farmer MD OA:[id number]    Source (AFB) FLUID  Final    Comment: FLUID RIGHT KNEE SYNOVIUM Performed at [facility], [address] [place], [place] [address]   Aerobic/Anaerobic Culture w Gram Stain (surgical/deep wound)     Status: None (Preliminary result)   Collection Time: 07/11/21  3:34 PM   Specimen: PATH Other;  Tissue  Result Value Ref Range Status   Specimen Description   Final    SYNOVIAL Performed at [facility], [address] [place], [place] [address]    Special Requests   Final    RIGHT KNEE 2 Performed at [facility], [place], [place] [address]    Gram Stain   Final    FEW WBC PRESENT, PREDOMINANTLY MONONUCLEAR RARE GRAM POSITIVE COCCI Performed at [name], [place] [address], [place], [place] [address]    Culture   Final    RARE STAPHYLOCOCCUS AUREUS SUSCEPTIBILITIES PERFORMED ON PREVIOUS CULTURE WITHIN THE LAST 5 DAYS. NO ANAEROBES ISOLATED; CULTURE IN PROGRESS FOR 5 DAYS    Report Status PENDING  Incomplete  Aerobic/Anaerobic Culture w Gram Stain (surgical/deep wound)     Status: None (Preliminary result)   Collection Time: 07/11/21  3:35 PM   Specimen: PATH Other; Tissue  Result Value Ref Range Status   Specimen Description   Final    SYNOVIAL Performed at [facility], [address], [place], [place] [address]    Special Requests   Final    RIGHT KNEE 3 Performed at [facility], [address] [place], [place] [address]    Gram Stain   Final    RARE WBC PRESENT, PREDOMINANTLY MONONUCLEAR NO ORGANISMS SEEN Performed at [name], [place] [address] [place], [place] [address]    Culture   Final    RARE STAPHYLOCOCCUS AUREUS SUSCEPTIBILITIES PERFORMED ON PREVIOUS CULTURE WITHIN THE LAST 5 DAYS. CRITICAL RESULT CALLED TO, READ BACK BY AND VERIFIED WITH: RN RANDY RANDY P.  [id number] FCP NO ANAEROBES ISOLATED; CULTURE IN PROGRESS FOR 5 DAYS    Report Status PENDING  Incomplete  Acid Fast Smear (AFB)     Status: None   Collection Time: 07/11/21  3:35 PM   Specimen: PATH Other; Tissue  Result Value Ref Range Status   AFB Specimen Processing Concentration  Final   Acid Fast Smear Negative  Final    Comment: (NOTE) Performed At: [facility] [place], [place]  [id number] Rush Farmer MD EV:[id number]    Source (AFB) SYNOVIAL  Final    Comment: Performed at [facility], [address] [place], [place] [address]      Radiology Studies:  No results found.   Scheduled Meds:  vitamin C  500 mg Oral BID   chlorhexidine gluconate (MEDLINE KIT)  15 mL Mouth Rinse BID   Chlorhexidine Gluconate Cloth  6 each Topical Q0600   COVID-19 mRNA bivalent vaccine (Moderna)  0.5 mL Intramuscular Once   docusate sodium  100 mg Oral BID   DULoxetine  60 mg Oral Daily   enoxaparin (LOVENOX) injection  0.5 mg/kg Subcutaneous Q24H   feeding supplement (NEPRO CARB STEADY)  237 mL Oral TID BM   folic acid  1 mg Per Tube Daily   furosemide  40 mg Intravenous BID   insulin aspart  0-20 Units Subcutaneous TID WC   insulin glargine-yfgn  15 Units Subcutaneous BID   lidocaine  1 patch Transdermal Q24H   metoprolol succinate  100 mg Oral Daily   multivitamin with minerals  1 tablet Oral Daily   nicotine  21 mg Transdermal Daily   nystatin   Topical TID   polyethylene glycol  17 g Per Tube Daily   predniSONE  5 mg Per Tube Q breakfast   pregabalin  200 mg Oral BID   sodium chloride flush  10-40 mL Intracatheter Q12H   zinc oxide   Topical Daily   Continuous Infusions:  sodium chloride Stopped (07/11/21 1454)   famotidine (PEPCID) IV 20 mg (07/15/21 1151)   nafcillin (NAFCIL) continuous infusion 20.8 mL/hr at 07/14/21 1822     LOS: 7 days     Enzo Bi, MD [facility] If 7PM-7AM, please contact night-coverage 07/15/2021, 4:27 PM

## 2021-07-15 NOTE — Progress Notes (Addendum)
PHARMACY CONSULT NOTE - FOLLOW UP  Pharmacy Consult for Electrolyte Monitoring and Replacement   Recent Labs: Potassium (mmol/L)  Date Value  07/15/2021 3.4 (L)   Magnesium (mg/dL)  Date Value  07/15/2021 1.8   Calcium (mg/dL)  Date Value  07/15/2021 8.9   Albumin (g/dL)  Date Value  07/12/2021 2.6 (L)   Phosphorus (mg/dL)  Date Value  07/14/2021 2.9   Sodium (mmol/L)  Date Value  07/15/2021 141     Assessment: 30YOM presenting with shortness of breath and sepsis. Currently intubated. Found to have MSSA bacteremia. PMH includes PE and MI.  Goal of Therapy:  Electrolytes WNL  Plan:  KCL 40 mEq x 2 PO.  Mg 2 g IV x 1  Pt transferred to !A from ICU. Will d/c CCM electrolyte consult. Please re-consult if needed.  F/u with AM labs.    Eleonore Chiquito, PharmD,  Clinical Pharmacist 07/15/2021 8:38 AM

## 2021-07-15 NOTE — Plan of Care (Signed)

## 2021-07-16 DIAGNOSIS — J9621 Acute and chronic respiratory failure with hypoxia: Secondary | ICD-10-CM | POA: Diagnosis not present

## 2021-07-16 LAB — BASIC METABOLIC PANEL
BUN: <5 mg/dL — ABNORMAL LOW (ref 6–20)
CO2: >45 mmol/L — ABNORMAL HIGH (ref 22–32)
Calcium: 8.9 mg/dL (ref 8.9–10.3)
Chloride: 83 mmol/L — ABNORMAL LOW (ref 98–111)
Creatinine, Ser: 0.35 mg/dL — ABNORMAL LOW (ref 0.61–1.24)
GFR, Estimated: >60 mL/min (ref >60)
Glucose, Bld: 157 mg/dL — ABNORMAL HIGH (ref 70–99)
Potassium: 3.7 mmol/L (ref 3.5–5.1)
Sodium: 140 mmol/L (ref 135–145)

## 2021-07-16 LAB — CBC
HCT: 31.3 % — ABNORMAL LOW (ref 39.0–52.0)
Hemoglobin: 9 g/dL — ABNORMAL LOW (ref 13.0–17.0)
MCH: 28 pg (ref 26.0–34.0)
MCHC: 28.8 g/dL — ABNORMAL LOW (ref 30.0–36.0)
MCV: 97.2 fL (ref 80.0–100.0)
Platelets: 317 10*3/uL (ref 150–400)
RBC: 3.22 MIL/uL — ABNORMAL LOW (ref 4.22–5.81)
RDW: 18.4 % — ABNORMAL HIGH (ref 11.5–15.5)
WBC: 9 10*3/uL (ref 4.0–10.5)
nRBC: 0 % (ref 0.0–0.2)

## 2021-07-16 LAB — AEROBIC/ANAEROBIC CULTURE W GRAM STAIN (SURGICAL/DEEP WOUND): Culture: NO GROWTH

## 2021-07-16 LAB — GLUCOSE, CAPILLARY
Glucose-Capillary: 164 mg/dL — ABNORMAL HIGH (ref 70–99)
Glucose-Capillary: 216 mg/dL — ABNORMAL HIGH (ref 70–99)
Glucose-Capillary: 230 mg/dL — ABNORMAL HIGH (ref 70–99)
Glucose-Capillary: 175 mg/dL — ABNORMAL HIGH (ref 70–99)

## 2021-07-16 LAB — MAGNESIUM: Magnesium: 1.7 mg/dL (ref 1.7–2.4)

## 2021-07-16 MED ORDER — POLYETHYLENE GLYCOL 3350 17 G PO PACK
17.0000 g | PACK | Freq: Every day | ORAL | Status: DC
  Administered 2021-07-16 – 2021-07-19 (×4): 17 g via ORAL
  Filled 2021-07-16 (×3): qty 1

## 2021-07-16 MED ORDER — FAMOTIDINE 20 MG PO TABS
20.0000 mg | ORAL_TABLET | Freq: Two times a day (BID) | ORAL | Status: DC
  Administered 2021-07-16 – 2021-07-25 (×18): 20 mg via ORAL
  Filled 2021-07-16 (×18): qty 1

## 2021-07-16 MED ORDER — ZINC OXIDE 40 % EX PSTE
PASTE | Freq: Two times a day (BID) | CUTANEOUS | Status: DC | PRN
  Filled 2021-07-16: qty 456
  Filled 2021-07-16: qty 454

## 2021-07-16 MED ORDER — PREDNISONE 10 MG PO TABS
5.0000 mg | ORAL_TABLET | Freq: Every day | ORAL | Status: DC
  Administered 2021-07-17 – 2021-07-25 (×9): 5 mg via ORAL
  Filled 2021-07-16 (×9): qty 1

## 2021-07-16 MED ORDER — INSULIN ASPART 100 UNIT/ML IJ SOLN
6.0000 [IU] | Freq: Three times a day (TID) | INTRAMUSCULAR | Status: DC
  Administered 2021-07-17 (×3): 6 [IU] via SUBCUTANEOUS
  Filled 2021-07-16 (×3): qty 1

## 2021-07-16 MED ORDER — ACETAZOLAMIDE 250 MG PO TABS
250.0000 mg | ORAL_TABLET | Freq: Two times a day (BID) | ORAL | Status: DC
  Administered 2021-07-16 – 2021-07-25 (×19): 250 mg via ORAL
  Filled 2021-07-16 (×20): qty 1

## 2021-07-16 MED ORDER — ONDANSETRON HCL 4 MG/2ML IJ SOLN
4.0000 mg | INTRAMUSCULAR | Status: DC | PRN
  Administered 2021-07-16 – 2021-07-24 (×3): 4 mg via INTRAVENOUS
  Filled 2021-07-16 (×3): qty 2

## 2021-07-16 MED ORDER — FOLIC ACID 1 MG PO TABS
1.0000 mg | ORAL_TABLET | Freq: Every day | ORAL | Status: DC
  Administered 2021-07-17 – 2021-07-25 (×9): 1 mg via ORAL
  Filled 2021-07-16 (×10): qty 1

## 2021-07-16 MED ORDER — BOOST / RESOURCE BREEZE PO LIQD CUSTOM
1.0000 | Freq: Three times a day (TID) | ORAL | Status: DC
  Administered 2021-07-16 – 2021-07-17 (×3): 1 via ORAL

## 2021-07-16 NOTE — Consult Note (Signed)
Rheumatology follow-up:  Right knee still has some pain with motion.  Nurse indicated orthopedics is contemplating recurrent aspiration tomorrow Temperature down Does complain of left hand pain and new pain in the left shoulder.  Harder to raise it. No change in neuropathic symptoms :foot drop, weakness in the right upper extremity and weakness of the left hand  Recommendations: Agree with lowering prednisone.  He is currently on 5 mg down from his 10 mg a day of admission.  Would like to eventually get him off this but he has been on it for several months and would have to come down slowly   Please do not restart methotrexate even as outpatient or in rehab.  Do not want to add immunosuppression and we will reassess his joint symptoms off of immunosuppression  Please continue on allopurinol  Given his new pain and loss of motion in his left shoulder and persistent pain in left hand, please order left shoulder x-ray and left hand film If pain in the shoulder persists ,may need MRI to make sure that that is not also septic joint  Thank you very much for your care of this patient

## 2021-07-16 NOTE — TOC Initial Note (Signed)
Transition of Care [facility]) - Initial/Assessment Note    Patient Details  Name: Craig [name] MRN: [id number] Date of Birth: 03-05-1991  [facility] CM/SW Contact:    Conception Oms, RN Phone Number: 07/16/2021, 1:09 PM  Clinical Narrative:        The patient is from home with his wife He has a hospital bed, grab bars on wall, w/c, lift chair he had a hoyer lift but got rid of it, He bathes outside with the waterhose aided by family Patient has had a perforated diverticulitis s/p partial colectomy with ostomy has morbid obesity  On Chronic O2 at home Planning to have a repeat irrigation and debridement of the knee. I notified Pam with [facility] of the need for IV ABX, and the need to have helms for Nursing PICC line care,   TOC to follow for needs   Expected Discharge Plan: [place] Barriers to Discharge: Continued Medical Work up   Patient Goals and CMS Choice Patient states their goals for this hospitalization and ongoing recovery are:: To return home, however if SNF recommended will consent.      Expected Discharge Plan and Services Expected Discharge Plan: [place] arrangements for the past 2 months: Single Family Home                                      Prior Living Arrangements/Services Living arrangements for the past 2 months: Single Family Home Lives with:: Minor Children, Spouse Patient language and need for interpreter reviewed:: Yes Do you feel safe going back to the place where you live?: Yes      Need for Family Participation in Patient Care: Yes (Comment) Care giver support system in place?: Yes (comment) Current home services: Home PT Criminal Activity/Legal Involvement Pertinent to Current Situation/Hospitalization: No - Comment as needed  Activities of Daily Living      Permission Sought/Granted                  Emotional Assessment Appearance::  Appears stated age Attitude/Demeanor/Rapport: Engaged Affect (typically observed): Accepting Orientation: : Oriented to Self, Oriented to Place Alcohol / Substance Use: Not Applicable Psych Involvement: No (comment)  Admission diagnosis:  Acute respiratory failure with hypoxia (HCC) [J96.01] Acute on chronic respiratory failure with hypoxemia (HCC) [J96.21] Sepsis with acute hypoxic respiratory failure, due to unspecified organism, unspecified whether septic shock present ([place]) [A41.9, R65.20, J96.01] Patient Active Problem List   Diagnosis Date Noted   Acute on chronic respiratory failure with hypoxemia ([place]) 07/08/2021   Neuropathic pain, arm 10/20/2020   Pressure injury of skin 09/18/2020   Staphylococcus aureus bacteremia    Fever    Metabolic encephalopathy    Acute respiratory failure with hypoxia (HCC)    Acute hypoxemic respiratory failure due to COVID-19 ([place]) 08/09/2020   Severe sepsis ([place]) 08/09/2020   AKI (acute kidney injury) ([place]) 08/09/2020   Hypertension    Leak of anastomosis between gastrointestinal structures 07/08/2020   Pneumoperitoneum 07/08/2020   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   Chronic gouty arthritis 02/17/2020   Class 2 severe obesity with serious comorbidity in adult [facility]) 02/17/2020   Diverticulitis of colon with perforation 11/22/2019   PCP:  [name], MD Pharmacy:   [id number] - Closed - [place], [address]  [place] [address] Phone: [phone] Fax: [phone]  [id number] - [place], [address] [place] [address] Phone: [phone] Fax: [phone]     Social Determinants of Health ([place]) Interventions    Readmission Risk Interventions Readmission Risk Prevention Plan 07/10/2021  Transportation Screening Complete  PCP or Specialist Appt within 3-5 Days Complete  HRI or [place] Complete  Social Work Consult for [place] Planning/Counseling Complete  Palliative Care Screening Not Applicable  Medication Review Press photographer) Complete  Some recent data might be hidden

## 2021-07-16 NOTE — Progress Notes (Signed)
PHARMACIST - PHYSICIAN COMMUNICATION  CONCERNING: IV to Oral Route Change Policy  RECOMMENDATION: This patient is receiving famotidine by the intravenous route.  Based on criteria approved by the Pharmacy and Therapeutics Committee, the intravenous medication(s) is/are being converted to the equivalent oral dose form(s).   DESCRIPTION: These criteria include: The patient is eating (either orally or via tube) and/or has been taking other orally administered medications for a least 24 hours The patient has no evidence of active gastrointestinal bleeding or impaired GI absorption (gastrectomy, short bowel, patient on TNA or NPO).  If you have questions about this conversion, please contact the [place] 07/16/2021 10:01 AM

## 2021-07-16 NOTE — Progress Notes (Signed)
PROGRESS NOTE    Craig [name]  QMV:[id number] DOB: 1990-11-30 DOA: 07/08/2021 PCP: [name], MD  [id number]/[id number]-[id number]   Assessment & Plan:   Principal Problem:   Acute on chronic respiratory failure with hypoxemia [facility]) Active Problems:   Colostomy status ([place])   Pressure injury of skin    Craig [name] is a 30 y.o. male with significant PMH of extensive medical history including inflammatory arthritis on methotrexate and chronic prednisone at 10 mg daily, prior severe Covid-19 infection requiring prolonged hospitalization and tracheostomy (since decannulated) Dec 2021 - Feb 2022, chronic hypoxic and hypercarbic respiratory failure on 2L O2 PRN, MSSA bacteremia, MSSA pneumonia, perforated diverticulitis s/p partial colectomy with ostomy creation, morbid obesity and gout who presented to the ED with chief complaints of progressive shortness of breath. Patient is currently intubated, history mostly obtained from patient's chart and patient's wife who is currently at the bedside.  Per patient's wife, EMS was called today due to worsening shortness of breath and intermittent altered mental status.  [name] stated the patient has been complaining of right knee pain was prescribed Percocet on Monday.  She took the Percocet at Monday and noticed worsening shortness of breath with mental status change per wife.  He also takes tramadol for pain.  No reports of chest pain, nausea vomiting, abdominal pain, diaphoresis, fevers or chills, cough or any other focal neurological symptoms or deficit.  On EMS arrival patient was noted to be hypoxic with a heart rate between 140 to 150 bpm.  He was placed on 6 L to maintain SPO2 greater than 90% and transported to the ED. In the ED, pt was noted to have seizure-like activity.  Pt was intubated, and PCCM consulted for admission and further management.  Pt was transferred to hospitalist service on 07/14/21.   Acute on chronic hypoxic and  hypercarbic respiratory failure On chronic 2L home O2 - Extubated 11/23.  Currently on 5L [place]. --Continue supplemental O2 to keep sats >=90%, wean as tolerated  Sepsis 2/2  MSSA bacteremia  Right septic joint s/p I/D on 07/11/21 -Infectious Disease consulted -s/p TEE - no veg -Repeat blood cultures neg. --purulent drainage noted from the knee wound. Plan: --cont nafcillin --pain control for right knee --repeat I/D with ortho tomorrow   Shock -RESOLVED  - Favor sedation/analgesia related hypotension rather than septic. --s/p pressors --cont home prednisone (for rheumatoid arthritis); no indication for stress dose steroids at this time  Acute toxic metabolic encephalopathy, resolved --2/2 sepsis -CT Head wnl   Seizure Like Activity- RESOLVED -CT Head wnl -Loaded with Keppra 1500 mg x 1 -Stopped Keppra; monitor for recurrence of seizure activity, per neuro   H/o upper extremity DVT It appears that he was started on Xarelto before around Feb/Mar 2022 for provoked upper extremity DVT. He has completed an appropriate course of anticoagulation at this time and therapeutic anticoagulation will be discontinued. CTA chest did not reveal PE on admission.   DM2, well controlled --A1c 6.8 --cont glargine 18u BID --increase mealtime to 6u TID --SSI TID  Probable OSA --look into getting pt qualified for CPAP --order CPAP nightly while inpatient  Anasarca --cont IV lasix 40 mg BID --strict I/O --will keep Foley for now given >8L urine output per day  Hypoalbuminemia  --albumin 2.6 --pt doesn't like Ensure --change to clear boost   DVT prophylaxis: Lovenox SQ Code Status: Full code  Family Communication: wife updated at bedside today  Level of care: Telemetry Surgical Dispo:  The patient is from: home Anticipated d/c is to: SNF Anticipated d/c date is: >3 days Patient currently is not medically ready to d/c due to: on 5L O2, need repeat I/D of right knee   Subjective  and Interval History:  Pt more awake today after stopping scheduled flexeril.  Pt reported feeling stiff and sore.  No dysuria.  >8L urine output for the past day.    Purulent drainage noted from the knee wound.  Plan to repeat I/D by ortho.   Objective: Vitals:   07/16/21 0500 07/16/21 0744 07/16/21 1214 07/16/21 1507  BP:  (!) 149/70 133/62 135/64  Pulse:  (!) 110 (!) 107 (!) 107  Resp:  _0 Temp:  99.1 F (37.3 C) 98.9 F (37.2 C) 98.9 F (37.2 C)  TempSrc:      SpO2:  98% 97% 99%  Weight: (!) 143.1 kg     Height:        Intake/Output Summary (Last 24 hours) at 07/16/2021 1536 Last data filed at 07/16/2021 1510 Gross per 24 hour  Intake 10 ml  Output 8150 ml  Net -8140 ml   Filed Weights   07/13/21 0500 07/15/21 0600 07/16/21 0500  Weight: (!) 145.1 kg (!) 145.7 kg (!) 143.1 kg    Examination:   Constitutional: NAD, sleepy but arousable and oriented HEENT: conjunctivae and lids normal, EOMI CV: No cyanosis.   RESP: increased respiratory effort Extremities: edema in BLE, R>L SKIN: warm, dry Foley present   Data Reviewed: I have personally reviewed following labs and imaging studies  CBC: Recent Labs  Lab 07/12/21 0635 07/13/21 0546 07/14/21 0702 07/15/21 0641 07/16/21 0559  WBC 7.7 8.8 9.3 9.4 9.0  HGB 8.3* 9.0* 8.7* 8.8* 9.0*  HCT 27.7* 31.4* 30.7* 30.2* 31.3*  MCV 93.9 95.7 97.5 98.1 97.2  PLT 191 235 254 254 295   Basic Metabolic Panel: Recent Labs  Lab 07/10/21 0528 07/11/21 0605 07/12/21 0635 07/13/21 0409 07/14/21 0702 07/15/21 0641 07/16/21 0559  NA 138 137 137 140 138 141 140  K 3.5 3.5 3.1* 4.2 3.2* 3.4* 3.7  CL 91* 89* 87* 89* 85* 87* 83*  CO2 40* 38* 43* 40* 45* 43* >45*  GLUCOSE 263* 191* 104* 155* 155* 155* 157*  BUN _1 <5* <5*  CREATININE 0.52* 0.60* 0.57* 0.48* 0.34* 0.34* 0.35*  CALCIUM 8.6* 9.0 8.3* 8.6* 8.6* 8.9 8.9  MG 2.2 1.9 1.8 1.7 2.1 1.8 1.7  PHOS 3.5 5.2* 2.8 4.2 2.9  --   --     GFR: Estimated Creatinine Clearance: 197.3 mL/min (A) (by C-G formula based on SCr of 0.35 mg/dL (L)). Liver Function Tests: Recent Labs  Lab 07/12/21 0626  AST 15  ALT 24  ALKPHOS 42  BILITOT 0.9  PROT 5.5*  ALBUMIN 2.6*   No results for input(s): LIPASE, AMYLASE in the last 168 hours. No results for input(s): AMMONIA in the last 168 hours. Coagulation Profile: No results for input(s): INR, PROTIME in the last 168 hours.  Cardiac Enzymes: No results for input(s): CKTOTAL, CKMB, CKMBINDEX, TROPONINI in the last 168 hours. BNP (last 3 results) No results for input(s): PROBNP in the last 8760 hours. HbA1C: No results for input(s): HGBA1C in the last 72 hours. CBG: Recent Labs  Lab 07/15/21 1645 07/15/21 2217 07/16/21 0842 07/16/21 1145 07/16/21 1504  GLUCAP 241* 204* 164* 216* 230*   Lipid Profile: No results for input(s): CHOL, HDL, LDLCALC, TRIG, CHOLHDL, LDLDIRECT in the  last 72 hours.  Thyroid Function Tests: No results for input(s): TSH, T4TOTAL, FREET4, T3FREE, THYROIDAB in the last 72 hours. Anemia Panel: No results for input(s): VITAMINB12, FOLATE, FERRITIN, TIBC, IRON, RETICCTPCT in the last 72 hours. Sepsis Labs: Recent Labs  Lab 07/15/21 0641  PROCALCITON 0.17    Recent Results (from the past 240 hour(s))  Blood Culture (routine x 2)     Status: Abnormal   Collection Time: 07/08/21  8:21 AM   Specimen: BLOOD  Result Value Ref Range Status   Specimen Description   Final    BLOOD RIGHT ANTECUBITAL Performed at [facility], [address], [place], [place] [address]    Special Requests   Final    BOTTLES DRAWN AEROBIC AND ANAEROBIC Blood Culture results may not be optimal due to an excessive volume of blood received in culture bottles Performed at [facility], [address] [place], [place] [address]    Culture  Setup Time   Final    Organism ID to follow [place] CRITICAL RESULT CALLED TO, READ BACK BY AND VERIFIED WITH: NATHAN BELEU AT 0230 07/09/2021 DLB Performed at [facility], [address] [place], [place] [address]    Culture STAPHYLOCOCCUS AUREUS (A)  Final   Report Status 07/11/2021 FINAL  Final   Organism ID, Bacteria STAPHYLOCOCCUS AUREUS  Final      Susceptibility   Staphylococcus aureus - MIC*    CIPROFLOXACIN 1 SENSITIVE Sensitive     ERYTHROMYCIN RESISTANT Resistant     GENTAMICIN <=0.5 SENSITIVE Sensitive     OXACILLIN <=0.25 SENSITIVE Sensitive     TETRACYCLINE <=1 SENSITIVE Sensitive     VANCOMYCIN <=0.5 SENSITIVE Sensitive     TRIMETH/SULFA <=10 SENSITIVE Sensitive     CLINDAMYCIN RESISTANT Resistant     RIFAMPIN <=0.5 SENSITIVE Sensitive     Inducible Clindamycin POSITIVE Resistant     * STAPHYLOCOCCUS AUREUS  Blood Culture (routine x 2)     Status: Abnormal   Collection Time: 07/08/21  8:21 AM   Specimen: BLOOD  Result Value Ref Range Status   Specimen Description   Final    BLOOD BLOOD RIGHT FOREARM Performed at [facility], [address] [place], [place] [address]    Special Requests   Final    BOTTLES DRAWN AEROBIC AND ANAEROBIC Blood Culture results may not be optimal due to an excessive volume of blood received in culture bottles Performed at [facility], [address] [place], [place] [address]    Culture  Setup Time   Final    GRAM POSITIVE COCCI ANAEROBIC BOTTLE ONLY CRITICAL VALUE NOTED.  VALUE IS CONSISTENT WITH PREVIOUSLY REPORTED AND CALLED VALUE. Performed at [facility], [address] [place], [place] [address]    Culture (A)  Final    STAPHYLOCOCCUS AUREUS SUSCEPTIBILITIES PERFORMED ON PREVIOUS CULTURE WITHIN THE LAST 5 DAYS. Performed at [facility], [place] [address], [place], [place] [address]    Report Status 07/12/2021 FINAL  Final  Blood Culture ID Panel (Reflexed)     Status: Abnormal   Collection Time: 07/08/21  8:21 AM   Result Value Ref Range Status   Enterococcus faecalis NOT DETECTED NOT DETECTED Final   Enterococcus Faecium NOT DETECTED NOT DETECTED Final   Listeria monocytogenes NOT DETECTED NOT DETECTED Final   Staphylococcus species DETECTED (A) NOT DETECTED Final    Comment: CRITICAL RESULT CALLED TO, READ BACK  BY AND VERIFIED WITH: NATHAN BELEU AT 0230 07/09/2021 DLB    Staphylococcus aureus (BCID) DETECTED (A) NOT DETECTED Final    Comment: CRITICAL RESULT CALLED TO, READ BACK BY AND VERIFIED WITH: NATHAN BELEU AT 0230 07/09/2021 DLB    Staphylococcus epidermidis NOT DETECTED NOT DETECTED Final   Staphylococcus lugdunensis NOT DETECTED NOT DETECTED Final   Streptococcus species NOT DETECTED NOT DETECTED Final   Streptococcus agalactiae NOT DETECTED NOT DETECTED Final   Streptococcus pneumoniae NOT DETECTED NOT DETECTED Final   Streptococcus pyogenes NOT DETECTED NOT DETECTED Final   A.calcoaceticus-baumannii NOT DETECTED NOT DETECTED Final   Bacteroides fragilis NOT DETECTED NOT DETECTED Final   Enterobacterales NOT DETECTED NOT DETECTED Final   Enterobacter cloacae complex NOT DETECTED NOT DETECTED Final   Escherichia coli NOT DETECTED NOT DETECTED Final   Klebsiella aerogenes NOT DETECTED NOT DETECTED Final   Klebsiella oxytoca NOT DETECTED NOT DETECTED Final   Klebsiella pneumoniae NOT DETECTED NOT DETECTED Final   Proteus species NOT DETECTED NOT DETECTED Final   Salmonella species NOT DETECTED NOT DETECTED Final   Serratia marcescens NOT DETECTED NOT DETECTED Final   Haemophilus influenzae NOT DETECTED NOT DETECTED Final   Neisseria meningitidis NOT DETECTED NOT DETECTED Final   Pseudomonas aeruginosa NOT DETECTED NOT DETECTED Final   Stenotrophomonas maltophilia NOT DETECTED NOT DETECTED Final   Candida albicans NOT DETECTED NOT DETECTED Final   Candida auris NOT DETECTED NOT DETECTED Final   Candida glabrata NOT DETECTED NOT DETECTED Final   Candida krusei NOT DETECTED NOT  DETECTED Final   Candida parapsilosis NOT DETECTED NOT DETECTED Final   Candida tropicalis NOT DETECTED NOT DETECTED Final   Cryptococcus neoformans/gattii NOT DETECTED NOT DETECTED Final   Meth resistant mecA/C and MREJ NOT DETECTED NOT DETECTED Final    Comment: Performed at [facility], [address] [place], [place] [address]  Resp Panel by RT-PCR (Flu A&B, Covid) Nasopharyngeal Swab     Status: None   Collection Time: 07/08/21  1:59 PM   Specimen: Nasopharyngeal Swab; Nasopharyngeal(NP) swabs in vial transport medium  Result Value Ref Range Status   SARS Coronavirus 2 by RT PCR NEGATIVE NEGATIVE Final    Comment: (NOTE) SARS-CoV-2 target nucleic acids are NOT DETECTED.  The SARS-CoV-2 RNA is generally detectable in upper respiratory specimens during the acute phase of infection. The lowest concentration of SARS-CoV-2 viral copies this assay can detect is 138 copies/mL. A negative result does not preclude SARS-Cov-2 infection and should not be used as the sole basis for treatment or other patient management decisions. A negative result may occur with  improper specimen collection/handling, submission of specimen other than nasopharyngeal swab, presence of viral mutation(s) within the areas targeted by this assay, and inadequate number of viral copies(<138 copies/mL). A negative result must be combined with clinical observations, patient history, and epidemiological information. The expected result is Negative.  Fact Sheet for Patients:  EntrepreneurPulse.com.au  Fact Sheet for Healthcare Providers:  IncredibleEmployment.be  This test is no t yet approved or cleared by the [place] FDA and  has been authorized for detection and/or diagnosis of SARS-CoV-2 by FDA under an Emergency Use Authorization (EUA). This EUA will remain  in effect (meaning this test can be used) for the duration of the COVID-19 declaration under Section  564(b)(1) of the Act, 21 U.S.C.section 360bbb-3(b)(1), unless the authorization is terminated  or revoked sooner.       Influenza A by PCR NEGATIVE NEGATIVE Final   Influenza B  by PCR NEGATIVE NEGATIVE Final    Comment: (NOTE) The Xpert Xpress SARS-CoV-2/FLU/RSV plus assay is intended as an aid in the diagnosis of influenza from Nasopharyngeal swab specimens and should not be used as a sole basis for treatment. Nasal washings and aspirates are unacceptable for Xpert Xpress SARS-CoV-2/FLU/RSV testing.  Fact Sheet for Patients: EntrepreneurPulse.com.au  Fact Sheet for Healthcare Providers: IncredibleEmployment.be  This test is not yet approved or cleared by the [place] FDA and has been authorized for detection and/or diagnosis of SARS-CoV-2 by FDA under an Emergency Use Authorization (EUA). This EUA will remain in effect (meaning this test can be used) for the duration of the COVID-19 declaration under Section 564(b)(1) of the Act, 21 U.S.C. section 360bbb-3(b)(1), unless the authorization is terminated or revoked.  Performed at [facility], [address] [place], [place] [address]   MRSA Next Gen by PCR, Nasal     Status: None   Collection Time: 07/08/21  4:24 PM   Specimen: Nasal Mucosa; Nasal Swab  Result Value Ref Range Status   MRSA by PCR Next Gen NOT DETECTED NOT DETECTED Final    Comment: (NOTE) The GeneXpert MRSA Assay (FDA approved for NASAL specimens only), is one component of a comprehensive MRSA colonization surveillance program. It is not intended to diagnose MRSA infection nor to guide or monitor treatment for MRSA infections. Test performance is not FDA approved in patients less than 41 years old. Performed at [facility], [place], [place] [address]   Respiratory (~20 pathogens) panel by PCR     Status: None   Collection Time: 07/08/21  5:30 PM   Specimen: Nasopharyngeal  Swab; Respiratory  Result Value Ref Range Status   Adenovirus NOT DETECTED NOT DETECTED Final   Coronavirus 229E NOT DETECTED NOT DETECTED Final    Comment: (NOTE) The Coronavirus on the Respiratory Panel, DOES NOT test for the novel  Coronavirus (2019 nCoV)    Coronavirus HKU1 NOT DETECTED NOT DETECTED Final   Coronavirus NL63 NOT DETECTED NOT DETECTED Final   Coronavirus OC43 NOT DETECTED NOT DETECTED Final   Metapneumovirus NOT DETECTED NOT DETECTED Final   Rhinovirus / Enterovirus NOT DETECTED NOT DETECTED Final   Influenza A NOT DETECTED NOT DETECTED Final   Influenza B NOT DETECTED NOT DETECTED Final   Parainfluenza Virus 1 NOT DETECTED NOT DETECTED Final   Parainfluenza Virus 2 NOT DETECTED NOT DETECTED Final   Parainfluenza Virus 3 NOT DETECTED NOT DETECTED Final   Parainfluenza Virus 4 NOT DETECTED NOT DETECTED Final   Respiratory Syncytial Virus NOT DETECTED NOT DETECTED Final   Bordetella pertussis NOT DETECTED NOT DETECTED Final   Bordetella Parapertussis NOT DETECTED NOT DETECTED Final   Chlamydophila pneumoniae NOT DETECTED NOT DETECTED Final   Mycoplasma pneumoniae NOT DETECTED NOT DETECTED Final    Comment: Performed at [facility], [place] [place], [place] [address]  CULTURE, BLOOD (ROUTINE X 2) w Reflex to ID Panel     Status: None   Collection Time: 07/09/21  2:39 PM   Specimen: BLOOD  Result Value Ref Range Status   Specimen Description BLOOD THUMB R  Final   Special Requests   Final    BOTTLES DRAWN AEROBIC AND ANAEROBIC Blood Culture results may not be optimal due to an inadequate volume of blood received in culture bottles   Culture   Final    NO GROWTH 5 DAYS Performed at [facility], [address] [place],  [place] [address]    Report Status 07/14/2021 FINAL  Final  CULTURE, BLOOD (ROUTINE X 2) w Reflex to ID Panel     Status: None   Collection Time: 07/09/21  2:50 PM   Specimen: BLOOD RIGHT HAND  Result Value Ref Range  Status   Specimen Description BLOOD RIGHT HAND  Final   Special Requests   Final    BOTTLES DRAWN AEROBIC AND ANAEROBIC Blood Culture adequate volume   Culture   Final    NO GROWTH 5 DAYS Performed at [facility], [address], [place], [place] [address]    Report Status 07/14/2021 FINAL  Final  Aerobic/Anaerobic Culture w Gram Stain (surgical/deep wound)     Status: None   Collection Time: 07/11/21  3:29 PM   Specimen: PATH Other; Tissue  Result Value Ref Range Status   Specimen Description   Final    SYNOVIAL Performed at [facility], [address] [place], [place] [address]    Special Requests   Final    RIGHT KNEE Performed at [facility], [address] [place], [place] [address]    Gram Stain   Final    FEW WBC PRESENT, PREDOMINANTLY MONONUCLEAR FEW GRAM POSITIVE COCCI    Culture   Final    RARE STAPHYLOCOCCUS AUREUS NO ANAEROBES ISOLATED Performed at [facility], [place] [address] [place], [place] [address]    Report Status 07/16/2021 FINAL  Final   Organism ID, Bacteria STAPHYLOCOCCUS AUREUS  Final      Susceptibility   Staphylococcus aureus - MIC*    CIPROFLOXACIN <=0.5 SENSITIVE Sensitive     ERYTHROMYCIN RESISTANT Resistant     GENTAMICIN <=0.5 SENSITIVE Sensitive     OXACILLIN <=0.25 SENSITIVE Sensitive     TETRACYCLINE <=1 SENSITIVE Sensitive     VANCOMYCIN 1 SENSITIVE Sensitive     TRIMETH/SULFA <=10 SENSITIVE Sensitive     CLINDAMYCIN RESISTANT Resistant     RIFAMPIN <=0.5 SENSITIVE Sensitive     Inducible Clindamycin POSITIVE Resistant     * RARE STAPHYLOCOCCUS AUREUS  Aerobic/Anaerobic Culture w Gram Stain (surgical/deep wound)     Status: None   Collection Time: 07/11/21  3:29 PM   Specimen: PATH Other; Tissue  Result Value Ref Range Status   Specimen Description   Final    SYNOVIAL Performed at [facility], [address], [place], [place] [address]    Special Requests   Final    RIGHT KNEE  SYNOVIAL FLUID 2 Performed at [facility], [address] [place], [place] [address]    Gram Stain   Final    ABUNDANT WBC PRESENT,BOTH PMN AND MONONUCLEAR MODERATE GRAM POSITIVE COCCI    Culture   Final    MODERATE STAPHYLOCOCCUS AUREUS NO ANAEROBES ISOLATED Performed at [facility], [place] [address] [place], [place] [address]    Report Status 07/16/2021 FINAL  Final   Organism ID, Bacteria STAPHYLOCOCCUS AUREUS  Final      Susceptibility   Staphylococcus aureus - MIC*    CIPROFLOXACIN <=0.5 SENSITIVE Sensitive     ERYTHROMYCIN RESISTANT Resistant     GENTAMICIN <=0.5 SENSITIVE Sensitive     OXACILLIN <=0.25 SENSITIVE Sensitive     TETRACYCLINE <=1 SENSITIVE Sensitive     VANCOMYCIN 1 SENSITIVE Sensitive     TRIMETH/SULFA <=10 SENSITIVE Sensitive     CLINDAMYCIN RESISTANT Resistant     RIFAMPIN <=0.5 SENSITIVE Sensitive     Inducible Clindamycin POSITIVE Resistant     *  MODERATE STAPHYLOCOCCUS AUREUS  Aerobic/Anaerobic Culture w Gram Stain (surgical/deep wound)     Status: None   Collection Time: 07/11/21  3:32 PM   Specimen: PATH Other; Tissue  Result Value Ref Range Status   Specimen Description   Final    SYNOVIAL Performed at [facility], [address] [place], [place] [address]    Special Requests   Final    RIGHT KNEE Performed at [facility], [address] [place], [place] [address]    Gram Stain   Final    RARE WBC PRESENT, PREDOMINANTLY MONONUCLEAR NO ORGANISMS SEEN    Culture   Final    No growth aerobically or anaerobically. Performed at [facility], [place] [address] [place], [place] [address]    Report Status 07/16/2021 FINAL  Final  Acid Fast Smear (AFB)     Status: None   Collection Time: 07/11/21  3:32 PM  Result Value Ref Range Status   AFB Specimen Processing Concentration  Final   Acid Fast Smear Negative  Final    Comment: (NOTE) Performed At: [facility] [place],  [place] [id number] Rush Farmer MD [id number]    Source (AFB) FLUID  Final    Comment: FLUID RIGHT KNEE SYNOVIUM Performed at [facility], [address], [place], [place] [address]   Aerobic/Anaerobic Culture w Gram Stain (surgical/deep wound)     Status: None   Collection Time: 07/11/21  3:34 PM   Specimen: PATH Other; Tissue  Result Value Ref Range Status   Specimen Description   Final    SYNOVIAL Performed at [facility], [address] [place], [place] [address]    Special Requests   Final    RIGHT KNEE 2 Performed at [facility], [address] [place], [place] [address]    Gram Stain   Final    FEW WBC PRESENT, PREDOMINANTLY MONONUCLEAR RARE GRAM POSITIVE COCCI    Culture   Final    RARE STAPHYLOCOCCUS AUREUS SUSCEPTIBILITIES PERFORMED ON PREVIOUS CULTURE WITHIN THE LAST 5 DAYS. NO ANAEROBES ISOLATED Performed at [facility], [place] [address] [place], [place] [address]    Report Status 07/16/2021 FINAL  Final  Aerobic/Anaerobic Culture w Gram Stain (surgical/deep wound)     Status: None   Collection Time: 07/11/21  3:35 PM   Specimen: PATH Other; Tissue  Result Value Ref Range Status   Specimen Description   Final    SYNOVIAL Performed at [facility], [address], [place], [place] [address]    Special Requests   Final    RIGHT KNEE 3 Performed at [facility], [address] [place], [place] [address]    Gram Stain   Final    RARE WBC PRESENT, PREDOMINANTLY MONONUCLEAR NO ORGANISMS SEEN    Culture   Final    RARE STAPHYLOCOCCUS AUREUS SUSCEPTIBILITIES PERFORMED ON PREVIOUS CULTURE WITHIN THE LAST 5 DAYS. CRITICAL RESULT CALLED TO, READ BACK BY AND VERIFIED WITH: RN RANDY RANDY P.  [phone] FCP NO ANAEROBES ISOLATED Performed at [facility], [place] [address] [place],  [address]    Report Status 07/16/2021 FINAL  Final  Acid Fast Smear (AFB)     Status: None   Collection  Time: 07/11/21  3:35 PM   Specimen: PATH Other; Tissue  Result Value Ref Range Status   AFB Specimen Processing Concentration  Final   Acid Fast Smear Negative  Final  Comment: (NOTE) Performed At: [facility] [place], [place] [id number] Rush Farmer MD [id number]    Source (AFB) SYNOVIAL  Final    Comment: Performed at [facility], [address], [place], [place] [address]      Radiology Studies: No results found.   Scheduled Meds:  acetaZOLAMIDE  250 mg Oral BID   vitamin C  500 mg Oral BID   chlorhexidine gluconate (MEDLINE KIT)  15 mL Mouth Rinse BID   Chlorhexidine Gluconate Cloth  6 each Topical Q0600   COVID-19 mRNA bivalent vaccine (Moderna)  0.5 mL Intramuscular Once   docusate sodium  100 mg Oral BID   DULoxetine  60 mg Oral Daily   enoxaparin (LOVENOX) injection  0.5 mg/kg Subcutaneous Q24H   famotidine  20 mg Oral BID   feeding supplement  1 Container Oral TID BM   [START ON 98/00/1239] folic acid  1 mg Oral Daily   furosemide  40 mg Intravenous BID   insulin aspart  0-20 Units Subcutaneous TID WC   insulin aspart  3 Units Subcutaneous TID WC   insulin glargine-yfgn  18 Units Subcutaneous BID   lidocaine  1 patch Transdermal Q24H   metoprolol succinate  100 mg Oral Daily   multivitamin with minerals  1 tablet Oral Daily   nicotine  21 mg Transdermal Daily   nystatin   Topical TID   polyethylene glycol  17 g Oral Daily   [START ON 07/17/2021] predniSONE  5 mg Oral Q breakfast   pregabalin  200 mg Oral BID   sodium chloride flush  10-40 mL Intracatheter Q12H   zinc oxide   Topical Daily   Continuous Infusions:  sodium chloride Stopped (07/11/21 1454)   nafcillin (NAFCIL) continuous infusion 12 g (07/15/21 1735)     LOS: 8 days     Enzo Bi, MD [facility] If 7PM-7AM, please contact night-coverage 07/16/2021, 3:36 PM

## 2021-07-16 NOTE — Progress Notes (Addendum)
Subjective: 5 Days Post-Op Procedure(s) (LRB): SYNOVIAL BIOPSY AND ARTHROSCOPIC IRRGATION AND DEBRIDEMENT OF SEPTIC KNEE (Right) SYNOVIAL BIOPSY (Right) Patient and family are sleeping well this morning.  CPAP in place. Reports no significant pain in the knee at this time. Nursing reports moderate drainage from the lateral aspect of the knee yesterday but didn't have to perform any new dressing changes overnight. Currently on IV Nafcillin for right septic knee and MSSA bacteremia  Objective: Vital signs in last 24 hours: Temp:  [98.3 F (36.8 C)-99.1 F (37.3 C)] 99.1 F (37.3 C) (11/27 0744) Pulse Rate:  [108-110] 110 (11/27 0744) Resp:  [18-20] 20 (11/27 0744) BP: (129-149)/(53-74) 149/70 (11/27 0744) SpO2:  [97 %-100 %] 98 % (11/27 0744) Weight:  [143.1 kg] 143.1 kg (11/27 0500)  Intake/Output from previous day: 11/26 0701 - 11/27 0700 In: 83.6 [I.V.:10; IV Piggyback:73.6] Out: 9050 [Urine:8750; Stool:300] Intake/Output this shift: No intake/output data recorded.  Recent Labs    07/14/21 0702 07/15/21 0641 07/16/21 0559  HGB 8.7* 8.8* 9.0*   Recent Labs    07/15/21 0641 07/16/21 0559  WBC 9.4 9.0  RBC 3.08* 3.22*  HCT 30.2* 31.3*  PLT 254 317   Recent Labs    07/15/21 0641 07/16/21 0559  NA 141 140  K 3.4* 3.7  CL 87* 83*  CO2 43* >45*  BUN <5* <5*  CREATININE 0.34* 0.35*  GLUCOSE 155* 157*  CALCIUM 8.9 8.9   No results for input(s): LABPT, INR in the last 72 hours.  EXAM General - Patient is well, sleeping well this morning, CPAP in place. Alert and appropriate this morning. Right lower Extremity -  Honeycomb dressings intact. Moderate purulent appearing drainage to the lateral aspect of the knee where the hemovac was removed.   Honeycombs were removed and knee was cleaned.  New dressings were applied over the right knee. Moderate effusion still present.  No erythema to the knee.  He is able to dorsiflex and plantarflex the foot.  Intact to light  touch to bilateral legs.  Past Medical History:  Diagnosis Date   Gout    Hypertension    Rupture of bowel (HCC)     Assessment/Plan:   5 Days Post-Op Procedure(s) (LRB): SYNOVIAL BIOPSY AND ARTHROSCOPIC IRRGATION AND DEBRIDEMENT OF SEPTIC KNEE (Right) SYNOVIAL BIOPSY (Right) Principal Problem:   Acute on chronic respiratory failure with hypoxemia (HCC) Active Problems:   Colostomy status (HCC)   Pressure injury of skin  Estimated body mass index is 41.62 kg/m as calculated from the following:   Height as of this encounter: 6\' 1"  (1.854 m).   Weight as of this encounter: 143.1 kg.  Cultures demonstrated staph aureus, sensitivities still pending.  Currently on Nafcillin. Gram stain with rare WBC, no organisms grown yet.  Honeycomb dressings changes this morning.  Moderate purulent drainage noted to the lateral aspect of the knee. Patient will likely need a repeat irrigation and debridement of the knee.  Will make NPO after midnight tonight. Up with therapy as tolerated.  Progress activities as tolerated.  DVT Prophylaxis - Lovenox  J. Cameron Proud, PA-C [place] 07/16/2021, 9:54 AM

## 2021-07-17 ENCOUNTER — Inpatient Hospital Stay: Payer: Medicaid Other

## 2021-07-17 DIAGNOSIS — J9621 Acute and chronic respiratory failure with hypoxia: Secondary | ICD-10-CM | POA: Diagnosis not present

## 2021-07-17 LAB — GLUCOSE, CAPILLARY
Glucose-Capillary: 193 mg/dL — ABNORMAL HIGH (ref 70–99)
Glucose-Capillary: 268 mg/dL — ABNORMAL HIGH (ref 70–99)
Glucose-Capillary: 194 mg/dL — ABNORMAL HIGH (ref 70–99)
Glucose-Capillary: 301 mg/dL — ABNORMAL HIGH (ref 70–99)

## 2021-07-17 LAB — BASIC METABOLIC PANEL
Anion gap: 10 (ref 5–15)
BUN: <5 mg/dL — ABNORMAL LOW (ref 6–20)
CO2: 41 mmol/L — ABNORMAL HIGH (ref 22–32)
Calcium: 9 mg/dL (ref 8.9–10.3)
Chloride: 86 mmol/L — ABNORMAL LOW (ref 98–111)
Creatinine, Ser: 0.36 mg/dL — ABNORMAL LOW (ref 0.61–1.24)
GFR, Estimated: >60 mL/min (ref >60)
Glucose, Bld: 223 mg/dL — ABNORMAL HIGH (ref 70–99)
Potassium: 3.3 mmol/L — ABNORMAL LOW (ref 3.5–5.1)
Sodium: 137 mmol/L (ref 135–145)

## 2021-07-17 LAB — MAGNESIUM: Magnesium: 1.5 mg/dL — ABNORMAL LOW (ref 1.7–2.4)

## 2021-07-17 LAB — CBC
HCT: 32.7 % — ABNORMAL LOW (ref 39.0–52.0)
Hemoglobin: 9.4 g/dL — ABNORMAL LOW (ref 13.0–17.0)
MCH: 27.6 pg (ref 26.0–34.0)
MCHC: 28.7 g/dL — ABNORMAL LOW (ref 30.0–36.0)
MCV: 96.2 fL (ref 80.0–100.0)
Platelets: 297 10*3/uL (ref 150–400)
RBC: 3.4 MIL/uL — ABNORMAL LOW (ref 4.22–5.81)
RDW: 18.9 % — ABNORMAL HIGH (ref 11.5–15.5)
WBC: 8.8 10*3/uL (ref 4.0–10.5)
nRBC: 0.2 % (ref 0.0–0.2)

## 2021-07-17 IMAGING — CR DG SHOULDER 2+V*L*
1 series · 3 of 3 positions shown · non-contrast
Comparison: None.

CLINICAL DATA: Left shoulder pain, no injury.

EXAM:
LEFT SHOULDER - 2+ VIEW

[Series 1: dg shoulder left · 0.14mm/px · 3 of 3 slices shown]
[im 1/3]
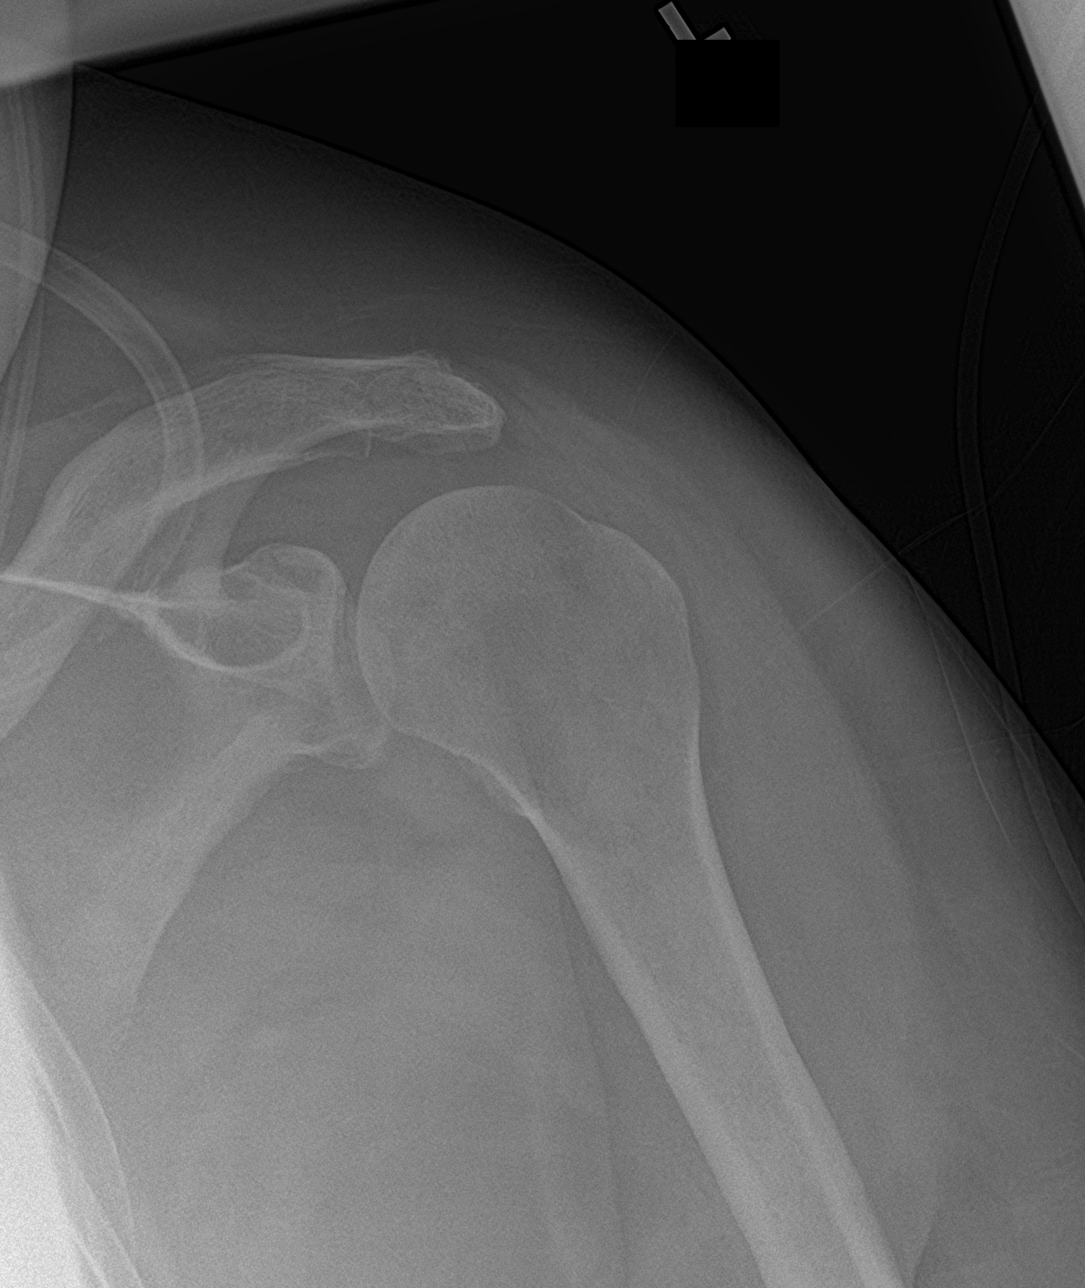
[im 2/3]
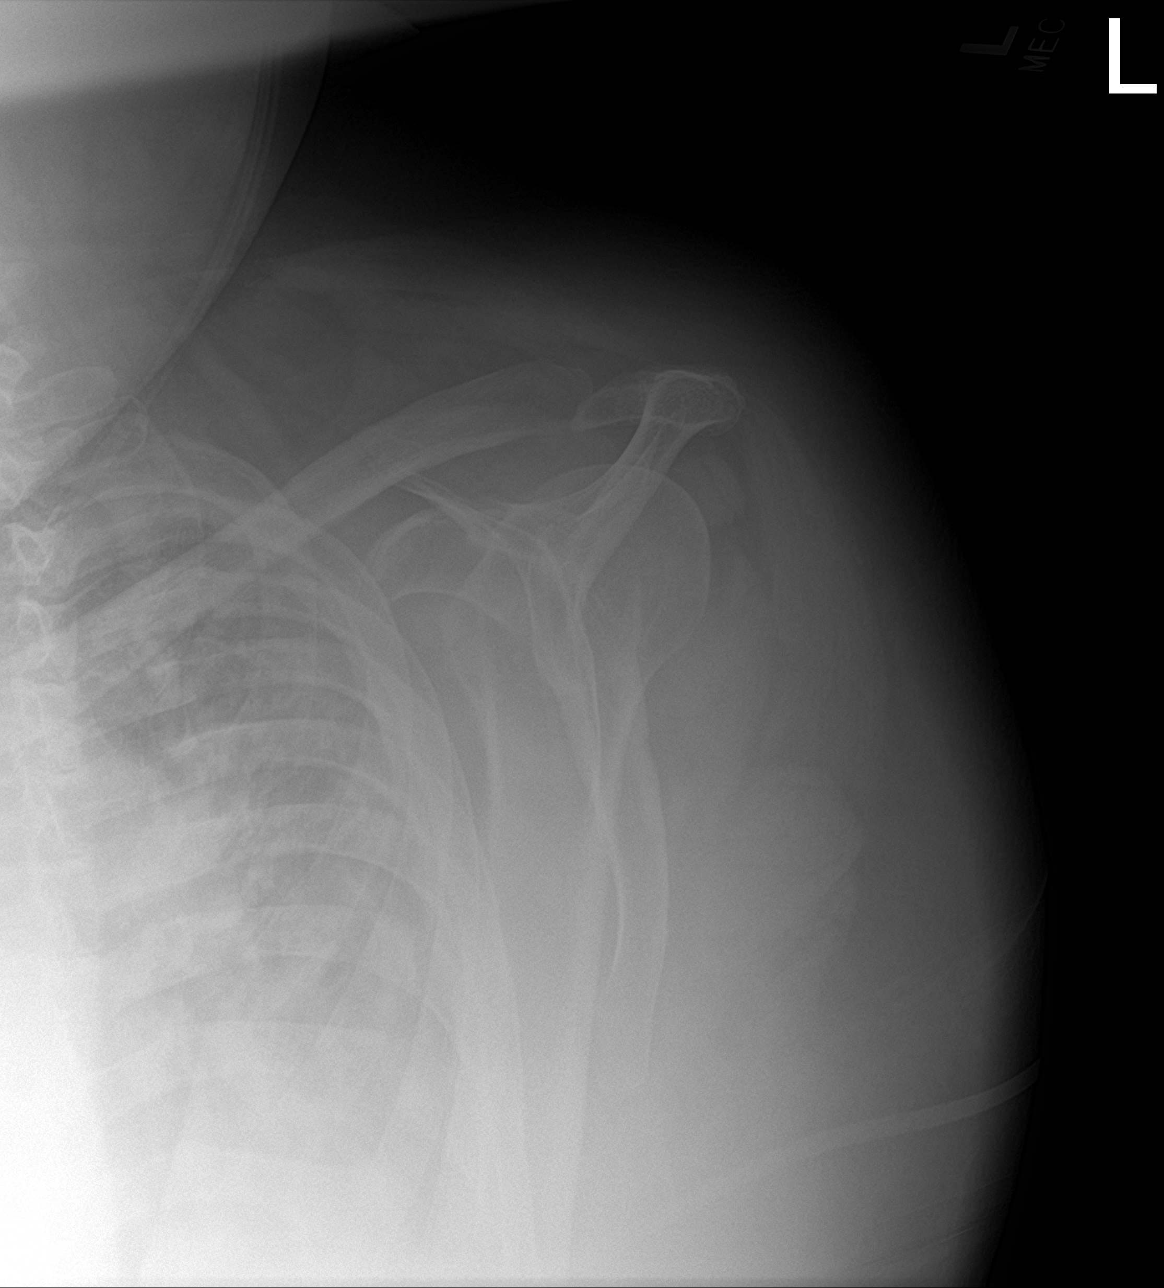
[im 3/3]
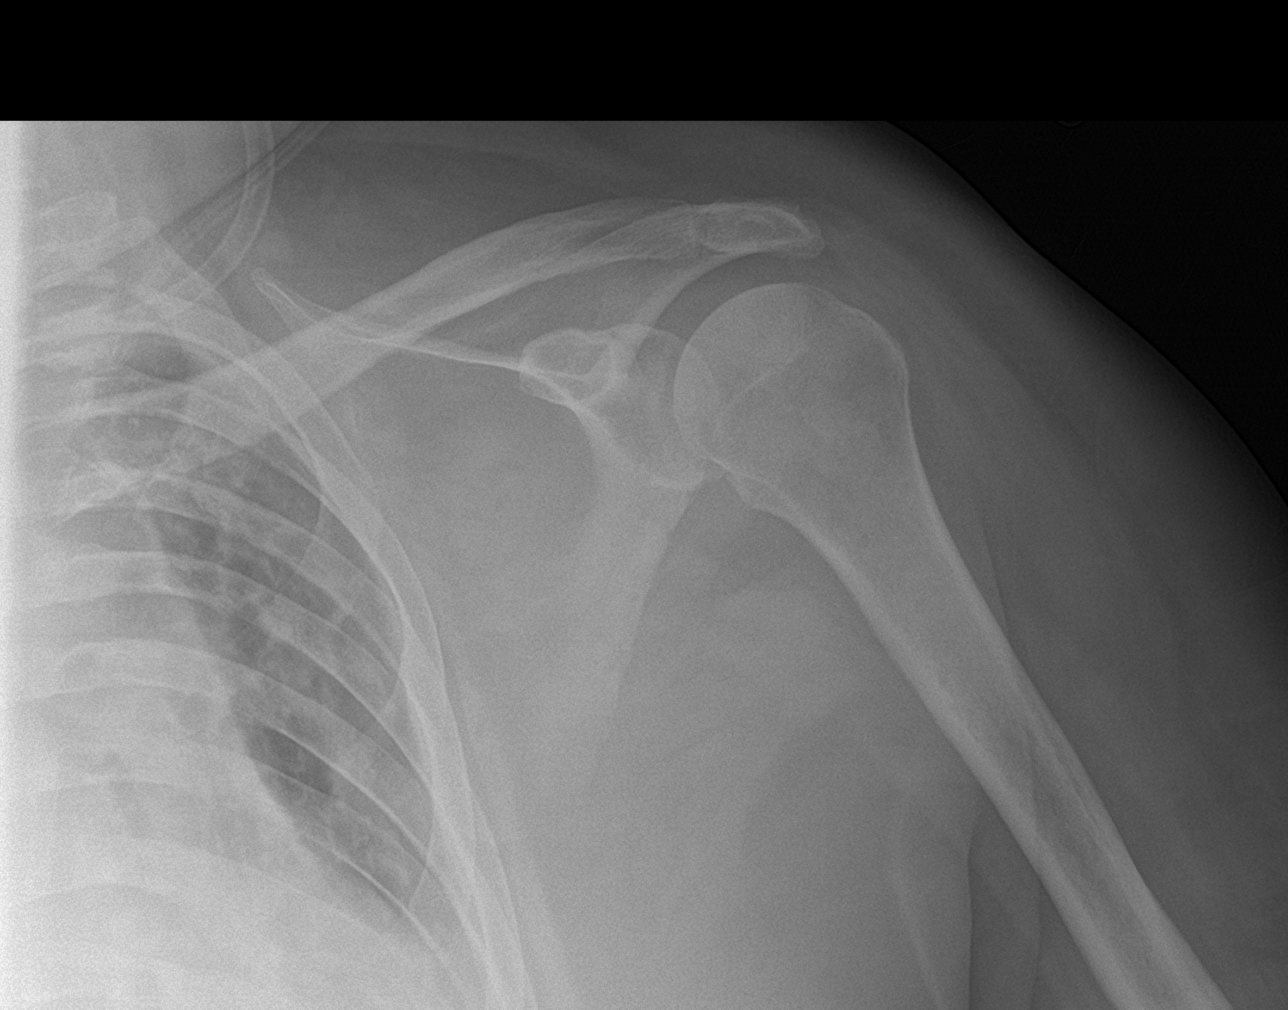

[3 of 3 positions shown; findings below may reference images not displayed]

FINDINGS: Mild degenerative changes in the left acromioclavicular joint. No
acute osseous or joint abnormality.
IMPRESSION: Mild left acromioclavicular joint osteoarthritis.

## 2021-07-17 MED ORDER — CYCLOBENZAPRINE HCL 10 MG PO TABS
5.0000 mg | ORAL_TABLET | Freq: Three times a day (TID) | ORAL | Status: DC | PRN
  Administered 2021-07-19 – 2021-07-21 (×3): 5 mg via ORAL
  Filled 2021-07-17 (×3): qty 1

## 2021-07-17 MED ORDER — NEPRO/CARBSTEADY PO LIQD
237.0000 mL | Freq: Three times a day (TID) | ORAL | Status: DC
  Administered 2021-07-17: 13:00:00 237 mL via ORAL

## 2021-07-17 MED ORDER — ALLOPURINOL 300 MG PO TABS
300.0000 mg | ORAL_TABLET | Freq: Every day | ORAL | Status: DC
  Administered 2021-07-17 – 2021-07-25 (×9): 300 mg via ORAL
  Filled 2021-07-17 (×9): qty 1

## 2021-07-17 MED ORDER — MAGNESIUM SULFATE 2 GM/50ML IV SOLN
2.0000 g | Freq: Once | INTRAVENOUS | Status: AC
  Administered 2021-07-17: 10:00:00 2 g via INTRAVENOUS
  Filled 2021-07-17: qty 50

## 2021-07-17 MED ORDER — INSULIN ASPART 100 UNIT/ML IJ SOLN
8.0000 [IU] | Freq: Three times a day (TID) | INTRAMUSCULAR | Status: DC
  Filled 2021-07-17 (×2): qty 1

## 2021-07-17 MED ORDER — BOOST / RESOURCE BREEZE PO LIQD CUSTOM
1.0000 | Freq: Three times a day (TID) | ORAL | Status: DC
  Administered 2021-07-17 – 2021-07-18 (×2): 1 via ORAL

## 2021-07-17 MED ORDER — POTASSIUM CHLORIDE CRYS ER 20 MEQ PO TBCR
40.0000 meq | EXTENDED_RELEASE_TABLET | ORAL | Status: AC
  Administered 2021-07-17 (×2): 40 meq via ORAL
  Filled 2021-07-17 (×2): qty 2

## 2021-07-17 NOTE — Progress Notes (Signed)
Date of Admission:  07/08/2021     ID: Craig [name] is a 30 y.o. male  Principal Problem:   Acute on chronic respiratory failure with hypoxemia (HCC) Active Problems:   Colostomy status (HCC)   Pressure injury of skin    Subjective: Pt is sleeping after getting Opiate for pain Wife at bed side Says he is going back for rt knee wash out tomorrow   Medications:   acetaZOLAMIDE  250 mg Oral BID   allopurinol  300 mg Oral Daily   vitamin C  500 mg Oral BID   chlorhexidine gluconate (MEDLINE KIT)  15 mL Mouth Rinse BID   Chlorhexidine Gluconate Cloth  6 each Topical Q0600   COVID-19 mRNA bivalent vaccine (Moderna)  0.5 mL Intramuscular Once   docusate sodium  100 mg Oral BID   DULoxetine  60 mg Oral Daily   enoxaparin (LOVENOX) injection  0.5 mg/kg Subcutaneous Q24H   famotidine  20 mg Oral BID   feeding supplement (NEPRO CARB STEADY)  237 mL Oral TID BM   folic acid  1 mg Oral Daily   furosemide  40 mg Intravenous BID   insulin aspart  0-20 Units Subcutaneous TID WC   insulin aspart  6 Units Subcutaneous TID WC   insulin glargine-yfgn  18 Units Subcutaneous BID   lidocaine  1 patch Transdermal Q24H   metoprolol succinate  100 mg Oral Daily   multivitamin with minerals  1 tablet Oral Daily   nicotine  21 mg Transdermal Daily   nystatin   Topical TID   polyethylene glycol  17 g Oral Daily   potassium chloride  40 mEq Oral Q4H   predniSONE  5 mg Oral Q breakfast   pregabalin  200 mg Oral BID   sodium chloride flush  10-40 mL Intracatheter Q12H   zinc oxide   Topical Daily    Objective: Vital signs in last 24 hours: Temp:  [98.2 F (36.8 C)-99.7 F (37.6 C)] 99.7 F (37.6 C) (11/28 1245) Pulse Rate:  [100-108] 100 (11/28 1245) Resp:  [18-23] 20 (11/28 1245) BP: (107-147)/(55-82) 136/82 (11/28 1245) SpO2:  [96 %-100 %] 97 % (11/28 1245) Weight:  [139.9 kg] 139.9 kg (11/28 0700)  PHYSICAL EXAM:  General: sleeping Lungs: b/l air entry- decreased air  entry bases Heart: ts1s2 Abdomen: Soft,colostomy  Extremities: all four edematous-  B/l feet - dorsum - scabbed wound  Neurologic: cannot access foley  Lab Results Recent Labs    07/16/21 0559 07/17/21 0554 07/17/21 0834  WBC 9.0  --  8.8  HGB 9.0*  --  9.4*  HCT 31.3*  --  32.7*  NA 140 137  --   K 3.7 3.3*  --   CL 83* 86*  --   CO2 >45* 41*  --   BUN <5* <5*  --   CREATININE 0.35* 0.36*  --    Liver Panel No results for input(s): PROT, ALBUMIN, AST, ALT, ALKPHOS, BILITOT, BILIDIR, IBILI in the last 72 hours.  Sedimentation Rate No results for input(s): ESRSEDRATE in the last 72 hours. C-Reactive Protein No results for input(s): CRP in the last 72 hours.  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

## 2021-07-17 NOTE — Progress Notes (Addendum)
   Subjective: 6 Days Post-Op Procedure(s) (LRB): SYNOVIAL BIOPSY AND ARTHROSCOPIC IRRGATION AND DEBRIDEMENT OF SEPTIC KNEE (Right) SYNOVIAL BIOPSY (Right) Patient reports right knee pain is mild.  Overall knee pain improving. Continues to have purulent drainage from the superior lateral incision site of the right knee.  Reports of left shoulder pain/weakness that's new.   Objective: Vital signs in last 24 hours: Temp:  [98.2 F (36.8 C)-99.1 F (37.3 C)] 99.1 F (37.3 C) (11/28 1149) Pulse Rate:  [102-108] 108 (11/28 1149) Resp:  [18-23] 23 (11/28 1149) BP: (107-147)/(55-73) 147/73 (11/28 1149) SpO2:  [96 %-100 %] 96 % (11/28 1149) Weight:  [139.9 kg] 139.9 kg (11/28 0700)  Intake/Output from previous day: 11/27 0701 - 11/28 0700 In: 640 [P.O.:640] Out: 9050 [Urine:8850; Stool:200] Intake/Output this shift: Total I/O In: 240 [P.O.:240] Out: 300 [Urine:300]  Recent Labs    07/15/21 0641 07/16/21 0559 07/17/21 0834  HGB 8.8* 9.0* 9.4*   Recent Labs    07/16/21 0559 07/17/21 0834  WBC 9.0 8.8  RBC 3.22* 3.40*  HCT 31.3* 32.7*  PLT 317 297   Recent Labs    07/16/21 0559 07/17/21 0554  NA 140 137  K 3.7 3.3*  CL 83* 86*  CO2 >45* 41*  BUN <5* <5*  CREATININE 0.35* 0.36*  GLUCOSE 157* 223*  CALCIUM 8.9 9.0   No results for input(s): LABPT, INR in the last 72 hours.  EXAM General - Patient is Alert, Appropriate, and Oriented Extremity - Neurovascular intact Sensation intact distally Intact pulses distally Dorsiflexion/Plantar flexion intact Dressing -medial and lateral joint line incision sites are intact.  Sutures intact.  Superior lateral incision site with mild purulent drainage with compression.  Knee is swollen, mild effusion.  No significant warmth or redness.  Negative Homans' sign. Motor Function - intact, moving foot and toes well on exam.   Past Medical History:  Diagnosis Date   Gout    Hypertension    Rupture of bowel (HCC)      Assessment/Plan:   6 Days Post-Op Procedure(s) (LRB): SYNOVIAL BIOPSY AND ARTHROSCOPIC IRRGATION AND DEBRIDEMENT OF SEPTIC KNEE (Right) SYNOVIAL BIOPSY (Right) Principal Problem:   Acute on chronic respiratory failure with hypoxemia (HCC) Active Problems:   Colostomy status (HCC)   Pressure injury of skin  Estimated body mass index is 40.69 kg/m as calculated from the following:   Height as of this encounter: 6\' 1"  (1.854 m).   Weight as of this encounter: 139.9 kg.  Discussed case with Dr. Rudene Christians.  We will schedule right knee arthroscopic I&D of septic knee for tomorrow 07/18/2021.  Patient placed n.p.o. after midnight tonight.  Xray of left shoulder ordered  T. Rachelle Hora, PA-C [place] 07/17/2021, 12:19 PM

## 2021-07-17 NOTE — Progress Notes (Signed)
PROGRESS NOTE    Craig [name]  BDZ:[id number] DOB: 07-11-1991 DOA: 07/08/2021 PCP: [name], MD  [id number]/[id number]-AA   Assessment & Plan:   Principal Problem:   Acute on chronic respiratory failure with hypoxemia [facility]) Active Problems:   Colostomy status ([place])   Pressure injury of skin    Craig [name] is a 30 y.o. male with significant PMH of extensive medical history including inflammatory arthritis on methotrexate and chronic prednisone at 10 mg daily, prior severe Covid-19 infection requiring prolonged hospitalization and tracheostomy (since decannulated) Dec 2021 - Feb 2022, chronic hypoxic and hypercarbic respiratory failure on 2L O2 PRN, MSSA bacteremia, MSSA pneumonia, perforated diverticulitis s/p partial colectomy with ostomy creation, morbid obesity and gout who presented to the ED with chief complaints of progressive shortness of breath. Patient is currently intubated, history mostly obtained from patient's chart and patient's wife who is currently at the bedside.  Per patient's wife, EMS was called today due to worsening shortness of breath and intermittent altered mental status.  [name] stated the patient has been complaining of right knee pain was prescribed Percocet on Monday.  She took the Percocet at Monday and noticed worsening shortness of breath with mental status change per wife.  He also takes tramadol for pain.  No reports of chest pain, nausea vomiting, abdominal pain, diaphoresis, fevers or chills, cough or any other focal neurological symptoms or deficit.  On EMS arrival patient was noted to be hypoxic with a heart rate between 140 to 150 bpm.  He was placed on 6 L to maintain SPO2 greater than 90% and transported to the ED. In the ED, pt was noted to have seizure-like activity.  Pt was intubated, and PCCM consulted for admission and further management.  Pt was transferred to hospitalist service on 07/14/21.   Acute on chronic hypoxic and  hypercarbic respiratory failure On chronic 2L home O2 - Extubated 11/23.  Currently on 3L [place]. --Continue supplemental O2 to keep sats >=90%, wean as tolerated  Sepsis 2/2  MSSA bacteremia  Right septic joint s/p I/D on 07/11/21 -Infectious Disease consulted -s/p TEE - no veg -Repeat blood cultures neg. --purulent drainage noted from the knee wound. Plan: --cont nafcillin --pain control for right knee --repeat I/D with ortho tomorrow   Shock -RESOLVED  - Favor sedation/analgesia related hypotension rather than septic. --s/p pressors --cont home prednisone (for rheumatoid arthritis); no indication for stress dose steroids at this time  Acute toxic metabolic encephalopathy, resolved --2/2 sepsis -CT Head wnl   Seizure Like Activity- RESOLVED -CT Head wnl -Loaded with Keppra 1500 mg x 1 -Stopped Keppra; monitor for recurrence of seizure activity, per neuro   H/o upper extremity DVT It appears that he was started on Xarelto before around Feb/Mar 2022 for provoked upper extremity DVT. He has completed an appropriate course of anticoagulation at this time and therapeutic anticoagulation will be discontinued. CTA chest did not reveal PE on admission.   DM2, well controlled --A1c 6.8 --cont glargine 18u BID --increase mealtime to 8u TID --SSI TID  Probable OSA --look into getting pt qualified for CPAP --CPAP nightly while inpatient  Anasarca --continues to have >8L urine output per day --cont IV lasix 40 mg BID --strict I/O --will keep Foley for now given >8L urine output per day  Contraction alkalosis --cont diamox  Hypoalbuminemia  --albumin 2.6 --pt refused nepro, supplement changed to clear boost --cont clear boost TID   DVT prophylaxis: Lovenox SQ Code Status: Full code  Family Communication: wife updated at bedside today  Level of care: Telemetry Surgical Dispo:   The patient is from: home Anticipated d/c is to: SNF Anticipated d/c date is: >3  days Patient currently is not medically ready to d/c due to: need repeat I/D of right knee   Subjective and Interval History:  Wife reported pt took a flexeril and became very sleepy.  Still complaining of feeling sore.  Still >8L urine output per day.   Objective: Vitals:   07/17/21 0700 07/17/21 0811 07/17/21 1149 07/17/21 1245  BP:  (!) 141/68 (!) 147/73 136/82  Pulse:  (!) 102 (!) 108 100  Resp:  20 (!) 23 20  Temp:  98.5 F (36.9 C) 99.1 F (37.3 C) 99.7 F (37.6 C)  TempSrc:    Oral  SpO2:  99% 96% 97%  Weight: (!) 139.9 kg     Height:        Intake/Output Summary (Last 24 hours) at 07/17/2021 1522 Last data filed at 07/17/2021 1311 Gross per 24 hour  Intake 880 ml  Output 6655 ml  Net -5775 ml   Filed Weights   07/15/21 0600 07/16/21 0500 07/17/21 0700  Weight: (!) 145.7 kg (!) 143.1 kg (!) 139.9 kg    Examination:   Constitutional: NAD, sleeping CV: No cyanosis.   RESP: normal respiratory effort, on 3L GI: colostomy bag with formed stool Extremities: edema in BLE. R>L.  Swelling in BUE improved. SKIN: warm, dry  Foley present   Data Reviewed: I have personally reviewed following labs and imaging studies  CBC: Recent Labs  Lab 07/13/21 0546 07/14/21 0702 07/15/21 0641 07/16/21 0559 07/17/21 0834  WBC 8.8 9.3 9.4 9.0 8.8  HGB 9.0* 8.7* 8.8* 9.0* 9.4*  HCT 31.4* 30.7* 30.2* 31.3* 32.7*  MCV 95.7 97.5 98.1 97.2 96.2  PLT 235 254 254 317 409   Basic Metabolic Panel: Recent Labs  Lab 07/11/21 0605 07/12/21 0635 07/13/21 0409 07/14/21 0702 07/15/21 0641 07/16/21 0559 07/17/21 0554  NA 137 137 140 138 141 140 137  K 3.5 3.1* 4.2 3.2* 3.4* 3.7 3.3*  CL 89* 87* 89* 85* 87* 83* 86*  CO2 38* 43* 40* 45* 43* >45* 41*  GLUCOSE 191* 104* 155* 155* 155* 157* 223*  BUN _0 <5* <5* <5*  CREATININE 0.60* 0.57* 0.48* 0.34* 0.34* 0.35* 0.36*  CALCIUM 9.0 8.3* 8.6* 8.6* 8.9 8.9 9.0  MG 1.9 1.8 1.7 2.1 1.8 1.7 1.5*  PHOS 5.2* 2.8 4.2 2.9  --    --   --    GFR: Estimated Creatinine Clearance: 194.8 mL/min (A) (by C-G formula based on SCr of 0.36 mg/dL (L)). Liver Function Tests: Recent Labs  Lab 07/12/21 0626  AST 15  ALT 24  ALKPHOS 42  BILITOT 0.9  PROT 5.5*  ALBUMIN 2.6*   No results for input(s): LIPASE, AMYLASE in the last 168 hours. No results for input(s): AMMONIA in the last 168 hours. Coagulation Profile: No results for input(s): INR, PROTIME in the last 168 hours.  Cardiac Enzymes: No results for input(s): CKTOTAL, CKMB, CKMBINDEX, TROPONINI in the last 168 hours. BNP (last 3 results) No results for input(s): PROBNP in the last 8760 hours. HbA1C: No results for input(s): HGBA1C in the last 72 hours. CBG: Recent Labs  Lab 07/16/21 1145 07/16/21 1504 07/16/21 2157 07/17/21 0848 07/17/21 1127  GLUCAP 216* 230* 175* 193* 268*   Lipid Profile: No results for input(s): CHOL, HDL, LDLCALC, TRIG, CHOLHDL, LDLDIRECT in the  last 72 hours.  Thyroid Function Tests: No results for input(s): TSH, T4TOTAL, FREET4, T3FREE, THYROIDAB in the last 72 hours. Anemia Panel: No results for input(s): VITAMINB12, FOLATE, FERRITIN, TIBC, IRON, RETICCTPCT in the last 72 hours. Sepsis Labs: Recent Labs  Lab 07/15/21 0641  PROCALCITON 0.17    Recent Results (from the past 240 hour(s))  Blood Culture (routine x 2)     Status: Abnormal   Collection Time: 07/08/21  8:21 AM   Specimen: BLOOD  Result Value Ref Range Status   Specimen Description   Final    BLOOD RIGHT ANTECUBITAL Performed at [facility], [address], [place], [place] [address]    Special Requests   Final    BOTTLES DRAWN AEROBIC AND ANAEROBIC Blood Culture results may not be optimal due to an excessive volume of blood received in culture bottles Performed at [facility], [address] [place], [place] [address]    Culture  Setup Time   Final    Organism ID to follow [place] CRITICAL RESULT CALLED TO, READ BACK BY AND VERIFIED WITH: NATHAN BELEU AT 0230 07/09/2021 DLB Performed at [facility], [address] [place], [place] [address]    Culture STAPHYLOCOCCUS AUREUS (A)  Final   Report Status 07/11/2021 FINAL  Final   Organism ID, Bacteria STAPHYLOCOCCUS AUREUS  Final      Susceptibility   Staphylococcus aureus - MIC*    CIPROFLOXACIN 1 SENSITIVE Sensitive     ERYTHROMYCIN RESISTANT Resistant     GENTAMICIN <=0.5 SENSITIVE Sensitive     OXACILLIN <=0.25 SENSITIVE Sensitive     TETRACYCLINE <=1 SENSITIVE Sensitive     VANCOMYCIN <=0.5 SENSITIVE Sensitive     TRIMETH/SULFA <=10 SENSITIVE Sensitive     CLINDAMYCIN RESISTANT Resistant     RIFAMPIN <=0.5 SENSITIVE Sensitive     Inducible Clindamycin POSITIVE Resistant     * STAPHYLOCOCCUS AUREUS  Blood Culture (routine x 2)     Status: Abnormal   Collection Time: 07/08/21  8:21 AM   Specimen: BLOOD  Result Value Ref Range Status   Specimen Description   Final    BLOOD BLOOD RIGHT FOREARM Performed at [facility], [address] [place], [place] [address]    Special Requests   Final    BOTTLES DRAWN AEROBIC AND ANAEROBIC Blood Culture results may not be optimal due to an excessive volume of blood received in culture bottles Performed at [facility], [address] [place], [place] [address]    Culture  Setup Time   Final    GRAM POSITIVE COCCI ANAEROBIC BOTTLE ONLY CRITICAL VALUE NOTED.  VALUE IS CONSISTENT WITH PREVIOUSLY REPORTED AND CALLED VALUE. Performed at [facility], [address] [place], [place] [address]    Culture (A)  Final    STAPHYLOCOCCUS AUREUS SUSCEPTIBILITIES PERFORMED ON PREVIOUS CULTURE WITHIN THE LAST 5 DAYS. Performed at [facility], [place] [address], [place], [place] [address]    Report Status 07/12/2021 FINAL  Final  Blood Culture ID Panel (Reflexed)     Status: Abnormal   Collection Time: 07/08/21  8:21 AM   Result Value Ref Range Status   Enterococcus faecalis NOT DETECTED NOT DETECTED Final   Enterococcus Faecium NOT DETECTED NOT DETECTED Final   Listeria monocytogenes NOT DETECTED NOT DETECTED Final   Staphylococcus species DETECTED (A) NOT DETECTED Final    Comment: CRITICAL RESULT CALLED TO, READ BACK  BY AND VERIFIED WITH: NATHAN BELEU AT 0230 07/09/2021 DLB    Staphylococcus aureus (BCID) DETECTED (A) NOT DETECTED Final    Comment: CRITICAL RESULT CALLED TO, READ BACK BY AND VERIFIED WITH: NATHAN BELEU AT 0230 07/09/2021 DLB    Staphylococcus epidermidis NOT DETECTED NOT DETECTED Final   Staphylococcus lugdunensis NOT DETECTED NOT DETECTED Final   Streptococcus species NOT DETECTED NOT DETECTED Final   Streptococcus agalactiae NOT DETECTED NOT DETECTED Final   Streptococcus pneumoniae NOT DETECTED NOT DETECTED Final   Streptococcus pyogenes NOT DETECTED NOT DETECTED Final   A.calcoaceticus-baumannii NOT DETECTED NOT DETECTED Final   Bacteroides fragilis NOT DETECTED NOT DETECTED Final   Enterobacterales NOT DETECTED NOT DETECTED Final   Enterobacter cloacae complex NOT DETECTED NOT DETECTED Final   Escherichia coli NOT DETECTED NOT DETECTED Final   Klebsiella aerogenes NOT DETECTED NOT DETECTED Final   Klebsiella oxytoca NOT DETECTED NOT DETECTED Final   Klebsiella pneumoniae NOT DETECTED NOT DETECTED Final   Proteus species NOT DETECTED NOT DETECTED Final   Salmonella species NOT DETECTED NOT DETECTED Final   Serratia marcescens NOT DETECTED NOT DETECTED Final   Haemophilus influenzae NOT DETECTED NOT DETECTED Final   Neisseria meningitidis NOT DETECTED NOT DETECTED Final   Pseudomonas aeruginosa NOT DETECTED NOT DETECTED Final   Stenotrophomonas maltophilia NOT DETECTED NOT DETECTED Final   Candida albicans NOT DETECTED NOT DETECTED Final   Candida auris NOT DETECTED NOT DETECTED Final   Candida glabrata NOT DETECTED NOT DETECTED Final   Candida krusei NOT DETECTED NOT  DETECTED Final   Candida parapsilosis NOT DETECTED NOT DETECTED Final   Candida tropicalis NOT DETECTED NOT DETECTED Final   Cryptococcus neoformans/gattii NOT DETECTED NOT DETECTED Final   Meth resistant mecA/C and MREJ NOT DETECTED NOT DETECTED Final    Comment: Performed at [facility], [address] [place], [place] [address]  Resp Panel by RT-PCR (Flu A&B, Covid) Nasopharyngeal Swab     Status: None   Collection Time: 07/08/21  1:59 PM   Specimen: Nasopharyngeal Swab; Nasopharyngeal(NP) swabs in vial transport medium  Result Value Ref Range Status   SARS Coronavirus 2 by RT PCR NEGATIVE NEGATIVE Final    Comment: (NOTE) SARS-CoV-2 target nucleic acids are NOT DETECTED.  The SARS-CoV-2 RNA is generally detectable in upper respiratory specimens during the acute phase of infection. The lowest concentration of SARS-CoV-2 viral copies this assay can detect is 138 copies/mL. A negative result does not preclude SARS-Cov-2 infection and should not be used as the sole basis for treatment or other patient management decisions. A negative result may occur with  improper specimen collection/handling, submission of specimen other than nasopharyngeal swab, presence of viral mutation(s) within the areas targeted by this assay, and inadequate number of viral copies(<138 copies/mL). A negative result must be combined with clinical observations, patient history, and epidemiological information. The expected result is Negative.  Fact Sheet for Patients:  EntrepreneurPulse.com.au  Fact Sheet for Healthcare Providers:  IncredibleEmployment.be  This test is no t yet approved or cleared by the [place] FDA and  has been authorized for detection and/or diagnosis of SARS-CoV-2 by FDA under an Emergency Use Authorization (EUA). This EUA will remain  in effect (meaning this test can be used) for the duration of the COVID-19 declaration under Section  564(b)(1) of the Act, 21 U.S.C.section 360bbb-3(b)(1), unless the authorization is terminated  or revoked sooner.       Influenza A by PCR NEGATIVE NEGATIVE Final   Influenza B  by PCR NEGATIVE NEGATIVE Final    Comment: (NOTE) The Xpert Xpress SARS-CoV-2/FLU/RSV plus assay is intended as an aid in the diagnosis of influenza from Nasopharyngeal swab specimens and should not be used as a sole basis for treatment. Nasal washings and aspirates are unacceptable for Xpert Xpress SARS-CoV-2/FLU/RSV testing.  Fact Sheet for Patients: EntrepreneurPulse.com.au  Fact Sheet for Healthcare Providers: IncredibleEmployment.be  This test is not yet approved or cleared by the [place] FDA and has been authorized for detection and/or diagnosis of SARS-CoV-2 by FDA under an Emergency Use Authorization (EUA). This EUA will remain in effect (meaning this test can be used) for the duration of the COVID-19 declaration under Section 564(b)(1) of the Act, 21 U.S.C. section 360bbb-3(b)(1), unless the authorization is terminated or revoked.  Performed at [facility], [address] [place], [place] [address]   MRSA Next Gen by PCR, Nasal     Status: None   Collection Time: 07/08/21  4:24 PM   Specimen: Nasal Mucosa; Nasal Swab  Result Value Ref Range Status   MRSA by PCR Next Gen NOT DETECTED NOT DETECTED Final    Comment: (NOTE) The GeneXpert MRSA Assay (FDA approved for NASAL specimens only), is one component of a comprehensive MRSA colonization surveillance program. It is not intended to diagnose MRSA infection nor to guide or monitor treatment for MRSA infections. Test performance is not FDA approved in patients less than 41 years old. Performed at [facility], [place], [place] [address]   Respiratory (~20 pathogens) panel by PCR     Status: None   Collection Time: 07/08/21  5:30 PM   Specimen: Nasopharyngeal  Swab; Respiratory  Result Value Ref Range Status   Adenovirus NOT DETECTED NOT DETECTED Final   Coronavirus 229E NOT DETECTED NOT DETECTED Final    Comment: (NOTE) The Coronavirus on the Respiratory Panel, DOES NOT test for the novel  Coronavirus (2019 nCoV)    Coronavirus HKU1 NOT DETECTED NOT DETECTED Final   Coronavirus NL63 NOT DETECTED NOT DETECTED Final   Coronavirus OC43 NOT DETECTED NOT DETECTED Final   Metapneumovirus NOT DETECTED NOT DETECTED Final   Rhinovirus / Enterovirus NOT DETECTED NOT DETECTED Final   Influenza A NOT DETECTED NOT DETECTED Final   Influenza B NOT DETECTED NOT DETECTED Final   Parainfluenza Virus 1 NOT DETECTED NOT DETECTED Final   Parainfluenza Virus 2 NOT DETECTED NOT DETECTED Final   Parainfluenza Virus 3 NOT DETECTED NOT DETECTED Final   Parainfluenza Virus 4 NOT DETECTED NOT DETECTED Final   Respiratory Syncytial Virus NOT DETECTED NOT DETECTED Final   Bordetella pertussis NOT DETECTED NOT DETECTED Final   Bordetella Parapertussis NOT DETECTED NOT DETECTED Final   Chlamydophila pneumoniae NOT DETECTED NOT DETECTED Final   Mycoplasma pneumoniae NOT DETECTED NOT DETECTED Final    Comment: Performed at [facility], [place] [place], [place] [address]  CULTURE, BLOOD (ROUTINE X 2) w Reflex to ID Panel     Status: None   Collection Time: 07/09/21  2:39 PM   Specimen: BLOOD  Result Value Ref Range Status   Specimen Description BLOOD THUMB R  Final   Special Requests   Final    BOTTLES DRAWN AEROBIC AND ANAEROBIC Blood Culture results may not be optimal due to an inadequate volume of blood received in culture bottles   Culture   Final    NO GROWTH 5 DAYS Performed at [facility], [address] [place],  [place] [address]    Report Status 07/14/2021 FINAL  Final  CULTURE, BLOOD (ROUTINE X 2) w Reflex to ID Panel     Status: None   Collection Time: 07/09/21  2:50 PM   Specimen: BLOOD RIGHT HAND  Result Value Ref Range  Status   Specimen Description BLOOD RIGHT HAND  Final   Special Requests   Final    BOTTLES DRAWN AEROBIC AND ANAEROBIC Blood Culture adequate volume   Culture   Final    NO GROWTH 5 DAYS Performed at [facility], [address], [place], [place] [address]    Report Status 07/14/2021 FINAL  Final  Aerobic/Anaerobic Culture w Gram Stain (surgical/deep wound)     Status: None   Collection Time: 07/11/21  3:29 PM   Specimen: PATH Other; Tissue  Result Value Ref Range Status   Specimen Description   Final    SYNOVIAL Performed at [facility], [address] [place], [place] [address]    Special Requests   Final    RIGHT KNEE Performed at [facility], [address] [place], [place] [address]    Gram Stain   Final    FEW WBC PRESENT, PREDOMINANTLY MONONUCLEAR FEW GRAM POSITIVE COCCI    Culture   Final    RARE STAPHYLOCOCCUS AUREUS NO ANAEROBES ISOLATED Performed at [facility], [place] [address] [place], [place] [address]    Report Status 07/16/2021 FINAL  Final   Organism ID, Bacteria STAPHYLOCOCCUS AUREUS  Final      Susceptibility   Staphylococcus aureus - MIC*    CIPROFLOXACIN <=0.5 SENSITIVE Sensitive     ERYTHROMYCIN RESISTANT Resistant     GENTAMICIN <=0.5 SENSITIVE Sensitive     OXACILLIN <=0.25 SENSITIVE Sensitive     TETRACYCLINE <=1 SENSITIVE Sensitive     VANCOMYCIN 1 SENSITIVE Sensitive     TRIMETH/SULFA <=10 SENSITIVE Sensitive     CLINDAMYCIN RESISTANT Resistant     RIFAMPIN <=0.5 SENSITIVE Sensitive     Inducible Clindamycin POSITIVE Resistant     * RARE STAPHYLOCOCCUS AUREUS  Aerobic/Anaerobic Culture w Gram Stain (surgical/deep wound)     Status: None   Collection Time: 07/11/21  3:29 PM   Specimen: PATH Other; Tissue  Result Value Ref Range Status   Specimen Description   Final    SYNOVIAL Performed at [facility], [address], [place], [place] [address]    Special Requests   Final    RIGHT KNEE  SYNOVIAL FLUID 2 Performed at [facility], [address] [place], [place] [address]    Gram Stain   Final    ABUNDANT WBC PRESENT,BOTH PMN AND MONONUCLEAR MODERATE GRAM POSITIVE COCCI    Culture   Final    MODERATE STAPHYLOCOCCUS AUREUS NO ANAEROBES ISOLATED Performed at [facility], [place] [address], [place], [place] [address]    Report Status 07/16/2021 FINAL  Final   Organism ID, Bacteria STAPHYLOCOCCUS AUREUS  Final      Susceptibility   Staphylococcus aureus - MIC*    CIPROFLOXACIN <=0.5 SENSITIVE Sensitive     ERYTHROMYCIN RESISTANT Resistant     GENTAMICIN <=0.5 SENSITIVE Sensitive     OXACILLIN <=0.25 SENSITIVE Sensitive     TETRACYCLINE <=1 SENSITIVE Sensitive     VANCOMYCIN 1 SENSITIVE Sensitive     TRIMETH/SULFA <=10 SENSITIVE Sensitive     CLINDAMYCIN RESISTANT Resistant     RIFAMPIN <=0.5 SENSITIVE Sensitive     Inducible Clindamycin POSITIVE Resistant     *  MODERATE STAPHYLOCOCCUS AUREUS  Aerobic/Anaerobic Culture w Gram Stain (surgical/deep wound)     Status: None   Collection Time: 07/11/21  3:32 PM   Specimen: PATH Other; Tissue  Result Value Ref Range Status   Specimen Description   Final    SYNOVIAL Performed at [facility], [address] [place], [place] [address]    Special Requests   Final    RIGHT KNEE Performed at [facility], [address] [place], [place] [address]    Gram Stain   Final    RARE WBC PRESENT, PREDOMINANTLY MONONUCLEAR NO ORGANISMS SEEN    Culture   Final    No growth aerobically or anaerobically. Performed at [facility], [place] [address] [place], [place] [address]    Report Status 07/16/2021 FINAL  Final  Acid Fast Smear (AFB)     Status: None   Collection Time: 07/11/21  3:32 PM  Result Value Ref Range Status   AFB Specimen Processing Concentration  Final   Acid Fast Smear Negative  Final    Comment: (NOTE) Performed At: [facility] [place],  [place] [id number] Rush Farmer MD [id number]    Source (AFB) FLUID  Final    Comment: FLUID RIGHT KNEE SYNOVIUM Performed at [facility], [address] [place], [place] [address]   Fungus Culture With Stain     Status: None (Preliminary result)   Collection Time: 07/11/21  3:32 PM  Result Value Ref Range Status   Fungus Stain Final report  Final    Comment: (NOTE) Performed At: [facility] [place], [place] [id number] Rush Farmer MD [id number]    Fungus (Mycology) Culture PENDING  Incomplete   Fungal Source KNEE SYNOVIUM  Final    Comment: Performed at [facility], [address] [place], [place] [address]  Fungus Culture Result     Status: None   Collection Time: 07/11/21  3:32 PM  Result Value Ref Range Status   Result 1 Comment  Final    Comment: (NOTE) KOH/Calcofluor preparation:  no fungus observed. Performed At: [facility] [place], [place] [id number] Rush Farmer MD EH:[id number]   Aerobic/Anaerobic Culture w Gram Stain (surgical/deep wound)     Status: None   Collection Time: 07/11/21  3:34 PM   Specimen: PATH Other; Tissue  Result Value Ref Range Status   Specimen Description   Final    SYNOVIAL Performed at [facility], [address] [place], [place] [address]    Special Requests   Final    RIGHT KNEE 2 Performed at [facility], [address] [place], [place] [address]    Gram Stain   Final    FEW WBC PRESENT, PREDOMINANTLY MONONUCLEAR RARE GRAM POSITIVE COCCI    Culture   Final    RARE STAPHYLOCOCCUS AUREUS SUSCEPTIBILITIES PERFORMED ON PREVIOUS CULTURE WITHIN THE LAST 5 DAYS. NO ANAEROBES ISOLATED Performed at [facility], [place] [address] [place], [place] [address]    Report Status 07/16/2021 FINAL  Final  Fungus Culture With Stain     Status: None (Preliminary result)   Collection Time: 07/11/21  3:35 PM   Specimen: PATH Other; Tissue   Result Value Ref Range Status   Fungus Stain Final report  Final    Comment: (NOTE) Performed At: [facility] [place], [place] [id number] Rush Farmer MD [id number]    Fungus (Mycology) Culture PENDING  Incomplete  Fungal Source SYNOVIAL  Final    Comment: Performed at [facility], [address] [place], [place] [address]  Aerobic/Anaerobic Culture w Gram Stain (surgical/deep wound)     Status: None   Collection Time: 07/11/21  3:35 PM   Specimen: PATH Other; Tissue  Result Value Ref Range Status   Specimen Description   Final    SYNOVIAL Performed at [facility], [address] [place], [place] [address]    Special Requests   Final    RIGHT KNEE 3 Performed at [facility], [address] [place], [place] [address]    Gram Stain   Final    RARE WBC PRESENT, PREDOMINANTLY MONONUCLEAR NO ORGANISMS SEEN    Culture   Final    RARE STAPHYLOCOCCUS AUREUS SUSCEPTIBILITIES PERFORMED ON PREVIOUS CULTURE WITHIN THE LAST 5 DAYS. CRITICAL RESULT CALLED TO, READ BACK BY AND VERIFIED WITH: RN RANDY RANDY P.  [id number] FCP NO ANAEROBES ISOLATED Performed at [facility], [place] [address] [place], [place] [address]    Report Status 07/16/2021 FINAL  Final  Acid Fast Smear (AFB)     Status: None   Collection Time: 07/11/21  3:35 PM   Specimen: PATH Other; Tissue  Result Value Ref Range Status   AFB Specimen Processing Concentration  Final   Acid Fast Smear Negative  Final    Comment: (NOTE) Performed At: [facility] [place], [place] [id number] Rush Farmer MD JO:[id number]    Source (AFB) SYNOVIAL  Final    Comment: Performed at [facility], [place] [name],  [address]  Fungus Culture Result     Status: None   Collection Time: 07/11/21  3:35 PM  Result Value Ref Range Status   Result 1 Comment  Final    Comment: (NOTE) KOH/Calcofluor preparation:  no  fungus observed. Performed At: [facility] [place], [place] [id number] Rush Farmer MD ML:[id number]       Radiology Studies: No results found.   Scheduled Meds:  acetaZOLAMIDE  250 mg Oral BID   allopurinol  300 mg Oral Daily   vitamin C  500 mg Oral BID   chlorhexidine gluconate (MEDLINE KIT)  15 mL Mouth Rinse BID   Chlorhexidine Gluconate Cloth  6 each Topical Q0600   COVID-19 mRNA bivalent vaccine (Moderna)  0.5 mL Intramuscular Once   docusate sodium  100 mg Oral BID   DULoxetine  60 mg Oral Daily   enoxaparin (LOVENOX) injection  0.5 mg/kg Subcutaneous Q24H   famotidine  20 mg Oral BID   feeding supplement (NEPRO CARB STEADY)  237 mL Oral TID BM   folic acid  1 mg Oral Daily   furosemide  40 mg Intravenous BID   insulin aspart  0-20 Units Subcutaneous TID WC   insulin aspart  6 Units Subcutaneous TID WC   insulin glargine-yfgn  18 Units Subcutaneous BID   lidocaine  1 patch Transdermal Q24H   metoprolol succinate  100 mg Oral Daily   multivitamin with minerals  1 tablet Oral Daily   nicotine  21 mg Transdermal Daily   nystatin   Topical TID   polyethylene glycol  17 g Oral Daily   predniSONE  5 mg Oral Q breakfast   pregabalin  200 mg Oral BID   sodium chloride flush  10-40 mL Intracatheter Q12H   zinc oxide   Topical Daily   Continuous Infusions:  sodium chloride Stopped (07/11/21  1454)   nafcillin (NAFCIL) continuous infusion 12 g (07/16/21 1627)     LOS: 9 days     Enzo Bi, MD [facility] If 7PM-7AM, please contact night-coverage 07/17/2021, 3:22 PM

## 2021-07-17 NOTE — Progress Notes (Signed)
PT Cancellation Note  Patient Details Name: Craig [name] MRN: [id number] DOB: January 26, 1991   Cancelled Treatment:    Reason Eval/Treat Not Completed: Fatigue/lethargy limiting ability to participate;Patient's level of consciousness (Chart reviewed, treatment attempted. Wife in room attempts to help author arouse patient, however he has been sleeping soundly recently, wife reports contributing muslce relaxer. Will attempt again at later date/time.) Per wife, pt to go back to OR tomorrow for Rt knee joint washout.   1:31 PM, 07/17/21 [name], PT, DPT Physical Therapist - [facility]  [phone] ([place])    Orient C 07/17/2021, 1:31 PM

## 2021-07-17 NOTE — Progress Notes (Signed)
Inpatient Diabetes Program Recommendations  AACE/ADA: New Consensus Statement on Inpatient Glycemic Control   Target Ranges:  Prepandial:   less than 140 mg/dL      Peak postprandial:   less than 180 mg/dL (1-2 hours)      Critically ill patients:  140 - 180 mg/dL     Latest Reference Range & Units 07/16/21 08:42 07/16/21 11:45 07/16/21 15:04 07/16/21 21:57 07/17/21 08:48 07/17/21 11:27  Glucose-Capillary 70 - 99 mg/dL 164 (H) 216 (H) 230 (H) 175 (H) 193 (H) 268 (H)   Review of Glycemic Control  Current orders for Inpatient glycemic control: Semglee 18 units BID, Novolog 6 units TID with meals, Novolog 0-20 units TID with meals; Prednisone 5 mg QAM  Inpatient Diabetes Program Recommendations:    Insulin: If steroids are continued, please consider increasing Semglee to 20 units BID and meal coverage to Novolog 8 units TID with meals if patient eats at least 50% of meals.  Thanks, Barnie Alderman, RN, MSN, CDE Diabetes Coordinator Inpatient Diabetes Program [phone] (Team Pager from 8am to 5pm)

## 2021-07-17 NOTE — Progress Notes (Addendum)
Nutrition Follow-up  DOCUMENTATION CODES:   Obesity unspecified  INTERVENTION:  -d/c Boost Breeze -Nepro Shake po TID, each supplement provides 425 kcal and 19 grams protein -continue MVI with minerals daily -continue vitamin C 559m po BID  NUTRITION DIAGNOSIS:   Inadequate oral intake related to inability to eat as evidenced by NPO status.  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  11/19 admitted, intubated 11/21 extubated 11/22 s/p I&D R knee 11/23 s/p TEE  Per MD, pt to have repeat I/D of R knee wound.  Pt on heart healthy diet and reports having good appetite. Pt has been receiving Ensure but reports disliking supplement. MD transitioned pt to Boost yesterday, however, RD to d/c this and order Nepro as stated in previous RD note on 11/23. Nepro will help to keep pt's blood sugars more controlled and will provide more kcals/protein than Boost.   PO Intake: 100% x 1 recorded meal   UOP: [id number] hours Colostomy output: 20068m24 hours I/O: -32L since admit  Current weight: 139.9 kg Admit weight: 145 kg   Pt with very deep pitting edema to BUE and BLE in addition to  moderate pitting generalized edema per RN assessment  Medications: Scheduled Meds:  acetaZOLAMIDE  250 mg Oral BID   allopurinol  300 mg Oral Daily   vitamin C  500 mg Oral BID   chlorhexidine gluconate (MEDLINE KIT)  15 mL Mouth Rinse BID   Chlorhexidine Gluconate Cloth  6 each Topical Q0600   COVID-19 mRNA bivalent vaccine (Moderna)  0.5 mL Intramuscular Once   docusate sodium  100 mg Oral BID   DULoxetine  60 mg Oral Daily   enoxaparin (LOVENOX) injection  0.5 mg/kg Subcutaneous Q24H   famotidine  20 mg Oral BID   feeding supplement  1 Container Oral TID BM   folic acid  1 mg Oral Daily   furosemide  40 mg Intravenous BID   insulin aspart  0-20 Units Subcutaneous TID WC   insulin aspart  6 Units Subcutaneous TID WC   insulin glargine-yfgn  18 Units Subcutaneous BID   lidocaine  1 patch Transdermal Q24H   metoprolol succinate  100 mg Oral Daily   multivitamin with minerals  1 tablet Oral Daily   nicotine  21 mg Transdermal Daily   nystatin   Topical TID   polyethylene glycol  17 g Oral Daily   potassium chloride  40 mEq Oral Q4H   predniSONE  5 mg Oral Q breakfast   pregabalin  200 mg Oral BID   sodium chloride flush  10-40 mL Intracatheter Q12H   zinc oxide   Topical Daily  Continuous Infusions:  sodium chloride Stopped (07/11/21 1454)   nafcillin (NAFCIL) continuous infusion 12 g (07/16/21 1627)   Labs: Recent  Labs  Lab 07/12/21 0635 07/13/21 0409 07/14/21 0702 07/15/21 0641 07/16/21 0559 07/17/21 0554  NA 137 140 138 141 140 137  K 3.1* 4.2 3.2* 3.4* 3.7 3.3*  CL 87* 89* 85* 87* 83* 86*  CO2 43* 40* 45* 43* >45* 41*  BUN '7 7 6 ' <5* <5* <5*  CREATININE 0.57* 0.48* 0.34* 0.34* 0.35* 0.36*  CALCIUM 8.3* 8.6* 8.6* 8.9 8.9 9.0  MG 1.8 1.7 2.1 1.8 1.7 1.5*  PHOS 2.8 4.2 2.9  --   --   --   GLUCOSE 104* 155* 155* 155* 157* 223*  CBGs: 175-268 x24 hours  Diet Order:   Diet Order             Diet Heart Room service appropriate? Yes; Fluid consistency: Thin  Diet  effective now                   EDUCATION NEEDS:   No education needs have been identified at this time  Skin:  Skin Assessment: Reviewed RN Assessment (Stage II coccyx, open skin wounds) Skin Integrity Issues:: Stage II Stage II: coccyx  Last BM:  11/27 via colostomy  Height:   Ht Readings from Last 1 Encounters:  07/08/21 '6\' 1"'  (1.854 m)    Weight:   Wt Readings from Last 1 Encounters:  07/17/21 (!) 139.9 kg    Ideal Body Weight:  83.6 kg  BMI:  Body mass index is 40.69 kg/m.  Estimated Nutritional Needs:   Kcal:  2700-3000kcal/day  Protein:  >125g/day  Fluid:  2.0L/day     Theone Stanley., MS, RD, LDN (she/her/hers) RD pager number and weekend/on-call pager number located in [place].

## 2021-07-18 ENCOUNTER — Inpatient Hospital Stay: Payer: Medicaid Other | Admitting: Registered Nurse

## 2021-07-18 ENCOUNTER — Encounter
Admission: EM | Disposition: A | Discharge status: Home Health | Payer: Self-pay | Source: Home / Self Care | Attending: Hospitalist

## 2021-07-18 ENCOUNTER — Encounter: Payer: Self-pay | Admitting: Internal Medicine

## 2021-07-18 DIAGNOSIS — J9621 Acute and chronic respiratory failure with hypoxia: Secondary | ICD-10-CM | POA: Diagnosis not present

## 2021-07-18 HISTORY — PX: KNEE ARTHROSCOPY: SHX127

## 2021-07-18 LAB — CBC
HCT: 34.4 % — ABNORMAL LOW (ref 39.0–52.0)
Hemoglobin: 10 g/dL — ABNORMAL LOW (ref 13.0–17.0)
MCH: 27.3 pg (ref 26.0–34.0)
MCHC: 29.1 g/dL — ABNORMAL LOW (ref 30.0–36.0)
MCV: 94 fL (ref 80.0–100.0)
Platelets: 327 10*3/uL (ref 150–400)
RBC: 3.66 MIL/uL — ABNORMAL LOW (ref 4.22–5.81)
RDW: 18.5 % — ABNORMAL HIGH (ref 11.5–15.5)
WBC: 8.1 10*3/uL (ref 4.0–10.5)
nRBC: 0.2 % (ref 0.0–0.2)

## 2021-07-18 LAB — POCT I-STAT, CHEM 8
BUN: <3 mg/dL — ABNORMAL LOW (ref 6–20)
Calcium, Ion: 1.1 mmol/L — ABNORMAL LOW (ref 1.15–1.40)
Chloride: 88 mmol/L — ABNORMAL LOW (ref 98–111)
Creatinine, Ser: 0.4 mg/dL — ABNORMAL LOW (ref 0.61–1.24)
Glucose, Bld: 234 mg/dL — ABNORMAL HIGH (ref 70–99)
HCT: 32 % — ABNORMAL LOW (ref 39.0–52.0)
Hemoglobin: 10.9 g/dL — ABNORMAL LOW (ref 13.0–17.0)
Potassium: 3.3 mmol/L — ABNORMAL LOW (ref 3.5–5.1)
Sodium: 135 mmol/L (ref 135–145)
TCO2: 41 mmol/L — ABNORMAL HIGH (ref 22–32)

## 2021-07-18 LAB — ACID FAST SMEAR (AFB, MYCOBACTERIA): Acid Fast Smear: NEGATIVE

## 2021-07-18 LAB — GLUCOSE, CAPILLARY
Glucose-Capillary: 216 mg/dL — ABNORMAL HIGH (ref 70–99)
Glucose-Capillary: 233 mg/dL — ABNORMAL HIGH (ref 70–99)
Glucose-Capillary: 350 mg/dL — ABNORMAL HIGH (ref 70–99)
Glucose-Capillary: 315 mg/dL — ABNORMAL HIGH (ref 70–99)

## 2021-07-18 LAB — BASIC METABOLIC PANEL
Anion gap: 8 (ref 5–15)
BUN: <5 mg/dL — ABNORMAL LOW (ref 6–20)
CO2: 40 mmol/L — ABNORMAL HIGH (ref 22–32)
Calcium: 9 mg/dL (ref 8.9–10.3)
Chloride: 89 mmol/L — ABNORMAL LOW (ref 98–111)
Creatinine, Ser: 0.42 mg/dL — ABNORMAL LOW (ref 0.61–1.24)
GFR, Estimated: >60 mL/min (ref >60)
Glucose, Bld: 214 mg/dL — ABNORMAL HIGH (ref 70–99)
Potassium: 2.9 mmol/L — ABNORMAL LOW (ref 3.5–5.1)
Sodium: 137 mmol/L (ref 135–145)

## 2021-07-18 LAB — MAGNESIUM: Magnesium: 1.8 mg/dL (ref 1.7–2.4)

## 2021-07-18 SURGERY — ARTHROSCOPY, KNEE
Anesthesia: General | Site: Knee | Laterality: Right

## 2021-07-18 MED ORDER — SUGAMMADEX SODIUM 500 MG/5ML IV SOLN
INTRAVENOUS | Status: AC
  Filled 2021-07-18: qty 5

## 2021-07-18 MED ORDER — ONDANSETRON HCL 4 MG/2ML IJ SOLN
INTRAMUSCULAR | Status: DC | PRN
  Administered 2021-07-18: 4 mg via INTRAVENOUS

## 2021-07-18 MED ORDER — ROCURONIUM BROMIDE 100 MG/10ML IV SOLN
INTRAVENOUS | Status: DC | PRN
  Administered 2021-07-18: 65 mg via INTRAVENOUS
  Administered 2021-07-18: 5 mg via INTRAVENOUS

## 2021-07-18 MED ORDER — LIVING WELL WITH DIABETES BOOK
Freq: Once | Status: AC
  Filled 2021-07-18: qty 1

## 2021-07-18 MED ORDER — HYDROMORPHONE HCL 1 MG/ML IJ SOLN
INTRAMUSCULAR | Status: AC
  Filled 2021-07-18: qty 1

## 2021-07-18 MED ORDER — BUPIVACAINE-EPINEPHRINE (PF) 0.5% -1:200000 IJ SOLN
INTRAMUSCULAR | Status: AC
  Filled 2021-07-18: qty 30

## 2021-07-18 MED ORDER — MIDAZOLAM HCL 2 MG/2ML IJ SOLN
INTRAMUSCULAR | Status: DC | PRN
  Administered 2021-07-18: 2 mg via INTRAVENOUS

## 2021-07-18 MED ORDER — FUROSEMIDE 10 MG/ML IJ SOLN
40.0000 mg | Freq: Two times a day (BID) | INTRAMUSCULAR | Status: DC
  Administered 2021-07-18 – 2021-07-20 (×5): 40 mg via INTRAVENOUS
  Filled 2021-07-18 (×4): qty 4

## 2021-07-18 MED ORDER — DEXAMETHASONE SODIUM PHOSPHATE 10 MG/ML IJ SOLN
INTRAMUSCULAR | Status: DC | PRN
  Administered 2021-07-18: 10 mg via INTRAVENOUS

## 2021-07-18 MED ORDER — LIDOCAINE HCL (CARDIAC) PF 100 MG/5ML IV SOSY
PREFILLED_SYRINGE | INTRAVENOUS | Status: DC | PRN
  Administered 2021-07-18: 100 mg via INTRAVENOUS

## 2021-07-18 MED ORDER — OXYCODONE HCL 5 MG PO TABS: 5.0000 mg | ORAL_TABLET | Freq: Once | ORAL | Status: DC | PRN

## 2021-07-18 MED ORDER — POTASSIUM CHLORIDE CRYS ER 20 MEQ PO TBCR: 40.0000 meq | EXTENDED_RELEASE_TABLET | Freq: Once | ORAL | Status: AC

## 2021-07-18 MED ORDER — ONDANSETRON HCL 4 MG/2ML IJ SOLN: 4.0000 mg | Freq: Once | INTRAMUSCULAR | Status: DC | PRN

## 2021-07-18 MED ORDER — PROPOFOL 10 MG/ML IV BOLUS
INTRAVENOUS | Status: DC | PRN
  Administered 2021-07-18: 200 mg via INTRAVENOUS
  Administered 2021-07-18: 50 mg via INTRAVENOUS

## 2021-07-18 MED ORDER — KETAMINE HCL 10 MG/ML IJ SOLN
INTRAMUSCULAR | Status: DC | PRN
  Administered 2021-07-18: 20 mg via INTRAVENOUS
  Administered 2021-07-18: 30 mg via INTRAVENOUS

## 2021-07-18 MED ORDER — INSULIN GLARGINE-YFGN 100 UNIT/ML ~~LOC~~ SOLN
20.0000 [IU] | Freq: Two times a day (BID) | SUBCUTANEOUS | Status: DC
  Administered 2021-07-18 – 2021-07-19 (×4): 20 [IU] via SUBCUTANEOUS
  Filled 2021-07-18 (×6): qty 0.2

## 2021-07-18 MED ORDER — SUGAMMADEX SODIUM 500 MG/5ML IV SOLN
INTRAVENOUS | Status: DC | PRN
  Administered 2021-07-18: 500 mg via INTRAVENOUS

## 2021-07-18 MED ORDER — FENTANYL CITRATE (PF) 100 MCG/2ML IJ SOLN
25.0000 ug | INTRAMUSCULAR | Status: DC | PRN
  Administered 2021-07-18: 50 ug via INTRAVENOUS
  Administered 2021-07-18 (×2): 25 ug via INTRAVENOUS

## 2021-07-18 MED ORDER — ONDANSETRON HCL 4 MG/2ML IJ SOLN
INTRAMUSCULAR | Status: AC
  Filled 2021-07-18: qty 2

## 2021-07-18 MED ORDER — ROCURONIUM BROMIDE 10 MG/ML (PF) SYRINGE
PREFILLED_SYRINGE | INTRAVENOUS | Status: AC
  Filled 2021-07-18: qty 10

## 2021-07-18 MED ORDER — POTASSIUM CHLORIDE CRYS ER 20 MEQ PO TBCR
40.0000 meq | EXTENDED_RELEASE_TABLET | Freq: Once | ORAL | Status: AC
  Administered 2021-07-18: 40 meq via ORAL

## 2021-07-18 MED ORDER — POTASSIUM CHLORIDE 10 MEQ/100ML IV SOLN
10.0000 meq | INTRAVENOUS | Status: AC
  Administered 2021-07-18 (×2): 10 meq via INTRAVENOUS
  Filled 2021-07-18 (×2): qty 100

## 2021-07-18 MED ORDER — LACTATED RINGERS IR SOLN
Status: DC | PRN
  Administered 2021-07-18 (×4): 3000 mL

## 2021-07-18 MED ORDER — POTASSIUM CHLORIDE CRYS ER 20 MEQ PO TBCR
EXTENDED_RELEASE_TABLET | ORAL | Status: AC
  Filled 2021-07-18: qty 2

## 2021-07-18 MED ORDER — LACTATED RINGERS IV SOLN: INTRAVENOUS | Status: DC

## 2021-07-18 MED ORDER — KETAMINE HCL 50 MG/5ML IJ SOSY
PREFILLED_SYRINGE | INTRAMUSCULAR | Status: AC
  Filled 2021-07-18: qty 5

## 2021-07-18 MED ORDER — SODIUM CHLORIDE 0.9 % IV SOLN: INTRAVENOUS | Status: DC

## 2021-07-18 MED ORDER — LIDOCAINE HCL (PF) 2 % IJ SOLN
INTRAMUSCULAR | Status: AC
  Filled 2021-07-18: qty 5

## 2021-07-18 MED ORDER — OXYCODONE HCL 5 MG/5ML PO SOLN: 5.0000 mg | Freq: Once | ORAL | Status: DC | PRN

## 2021-07-18 MED ORDER — SUCCINYLCHOLINE CHLORIDE 200 MG/10ML IV SOSY
PREFILLED_SYRINGE | INTRAVENOUS | Status: AC
  Filled 2021-07-18: qty 10

## 2021-07-18 MED ORDER — SUCCINYLCHOLINE CHLORIDE 200 MG/10ML IV SOSY
PREFILLED_SYRINGE | INTRAVENOUS | Status: DC | PRN
  Administered 2021-07-18: 120 mg via INTRAVENOUS

## 2021-07-18 MED ORDER — FENTANYL CITRATE (PF) 100 MCG/2ML IJ SOLN
INTRAMUSCULAR | Status: AC
  Filled 2021-07-18: qty 2

## 2021-07-18 MED ORDER — PROPOFOL 1000 MG/100ML IV EMUL
INTRAVENOUS | Status: AC
  Filled 2021-07-18: qty 100

## 2021-07-18 MED ORDER — INSULIN STARTER KIT- PEN NEEDLES (ENGLISH)
1.0000 | Freq: Once | Status: AC
  Administered 2021-07-18: 1
  Filled 2021-07-18: qty 1

## 2021-07-18 MED ORDER — ACETAMINOPHEN 10 MG/ML IV SOLN
INTRAVENOUS | Status: AC
  Filled 2021-07-18: qty 100

## 2021-07-18 MED ORDER — INSULIN ASPART 100 UNIT/ML IJ SOLN
10.0000 [IU] | Freq: Three times a day (TID) | INTRAMUSCULAR | Status: DC
  Administered 2021-07-18 – 2021-07-25 (×20): 10 [IU] via SUBCUTANEOUS
  Filled 2021-07-18 (×19): qty 1

## 2021-07-18 MED ORDER — DEXAMETHASONE SODIUM PHOSPHATE 10 MG/ML IJ SOLN
INTRAMUSCULAR | Status: AC
  Filled 2021-07-18: qty 1

## 2021-07-18 MED ORDER — ACETAMINOPHEN 10 MG/ML IV SOLN: 1000.0000 mg | Freq: Once | INTRAVENOUS | Status: DC | PRN

## 2021-07-18 MED ORDER — FENTANYL CITRATE (PF) 100 MCG/2ML IJ SOLN
INTRAMUSCULAR | Status: DC | PRN
  Administered 2021-07-18 (×2): 50 ug via INTRAVENOUS

## 2021-07-18 MED ORDER — MIDAZOLAM HCL 2 MG/2ML IJ SOLN
INTRAMUSCULAR | Status: AC
  Filled 2021-07-18: qty 2

## 2021-07-18 MED ORDER — POTASSIUM CHLORIDE CRYS ER 20 MEQ PO TBCR
EXTENDED_RELEASE_TABLET | ORAL | Status: AC
  Administered 2021-07-18: 40 meq via ORAL
  Filled 2021-07-18: qty 2

## 2021-07-18 MED ORDER — ACETAMINOPHEN 10 MG/ML IV SOLN
INTRAVENOUS | Status: DC | PRN
  Administered 2021-07-18: 1000 mg via INTRAVENOUS

## 2021-07-18 MED ORDER — HYDROMORPHONE HCL 1 MG/ML IJ SOLN
INTRAMUSCULAR | Status: DC | PRN
Start: 2021-07-18 — End: 2021-07-18
  Administered 2021-07-18: .25 mg via INTRAVENOUS

## 2021-07-18 SURGICAL SUPPLY — 32 items
BLADE INCISOR PLUS 4.5 (BLADE) IMPLANT
BNDG ELASTIC 4X5.8 VLCR STR LF (GAUZE/BANDAGES/DRESSINGS) IMPLANT
CHLORAPREP W/TINT 26 (MISCELLANEOUS) ×2 IMPLANT
CUFF TOURN SGL QUICK 24 (TOURNIQUET CUFF)
CUFF TOURN SGL QUICK 34 (TOURNIQUET CUFF)
CUFF TRNQT CYL 24X4X16.5-23 (TOURNIQUET CUFF) IMPLANT
CUFF TRNQT CYL 34X4.125X (TOURNIQUET CUFF) IMPLANT
DRAPE ARTHRO LIMB 89X125 STRL (DRAPES) ×2 IMPLANT
DRAPE C-ARMOR (DRAPES) IMPLANT
GAUZE SPONGE 4X4 12PLY STRL (GAUZE/BANDAGES/DRESSINGS) ×2 IMPLANT
GLOVE SURG SYN 9.0  PF PI (GLOVE) ×1
GLOVE SURG SYN 9.0 PF PI (GLOVE) ×1 IMPLANT
GOWN SRG 2XL LVL 4 RGLN SLV (GOWNS) ×1 IMPLANT
GOWN STRL NON-REIN 2XL LVL4 (GOWNS) ×1
GOWN STRL REUS W/ TWL LRG LVL3 (GOWN DISPOSABLE) ×2 IMPLANT
GOWN STRL REUS W/TWL LRG LVL3 (GOWN DISPOSABLE) ×2
HEMOVAC 400CC 10FR (MISCELLANEOUS) ×2 IMPLANT
IV LACTATED RINGER IRRG 3000ML (IV SOLUTION) ×2
IV LR IRRIG 3000ML ARTHROMATIC (IV SOLUTION) ×2 IMPLANT
KIT TURNOVER KIT A (KITS) ×2 IMPLANT
MANIFOLD NEPTUNE II (INSTRUMENTS) ×4 IMPLANT
NEEDLE HYPO 22GX1.5 SAFETY (NEEDLE) ×2 IMPLANT
PACK ARTHROSCOPY KNEE (MISCELLANEOUS) ×2 IMPLANT
SCALPEL PROTECTED #11 DISP (BLADE) ×2 IMPLANT
SPONGE T-LAP 18X18 ~~LOC~~+RFID (SPONGE) ×2 IMPLANT
SUT ETHILON 4-0 (SUTURE) ×1
SUT ETHILON 4-0 FS2 18XMFL BLK (SUTURE) ×1
SUTURE ETHLN 4-0 FS2 18XMF BLK (SUTURE) ×1 IMPLANT
TUBING INFLOW SET DBFLO PUMP (TUBING) ×2 IMPLANT
TUBING OUTFLOW SET DBLFO PUMP (TUBING) ×2 IMPLANT
WAND COBLATION FLOW 50 (SURGICAL WAND) IMPLANT
WATER STERILE IRR 500ML POUR (IV SOLUTION) ×2 IMPLANT

## 2021-07-18 NOTE — Progress Notes (Signed)
Date of Admission:  07/08/2021     ID: Craig [name] is a 30 y.o. male  Principal Problem:   Acute on chronic respiratory failure with hypoxemia ([place]) Active Problems:   Colostomy status ([place])   Pressure injury of skin  Wife at bed side  Subjective: C/o pain left shoulder Active movt restricted but can passively lift the arm  Underwent repeat knee irrigation today  Medications:   acetaZOLAMIDE  250 mg Oral BID   allopurinol  300 mg Oral Daily   vitamin C  500 mg Oral BID   chlorhexidine gluconate (MEDLINE KIT)  15 mL Mouth Rinse BID   Chlorhexidine Gluconate Cloth  6 each Topical Q0600   COVID-19 mRNA bivalent vaccine (Moderna)  0.5 mL Intramuscular Once   docusate sodium  100 mg Oral BID   DULoxetine  60 mg Oral Daily   enoxaparin (LOVENOX) injection  0.5 mg/kg Subcutaneous Q24H   famotidine  20 mg Oral BID   feeding supplement  1 Container Oral TID BM   folic acid  1 mg Oral Daily   insulin aspart  0-20 Units Subcutaneous TID WC   insulin aspart  10 Units Subcutaneous TID WC   insulin glargine-yfgn  20 Units Subcutaneous BID   insulin starter kit- pen needles  1 kit Other Once   lidocaine  1 patch Transdermal Q24H   living well with diabetes book   Does not apply Once   metoprolol succinate  100 mg Oral Daily   multivitamin with minerals  1 tablet Oral Daily   nicotine  21 mg Transdermal Daily   nystatin   Topical TID   polyethylene glycol  17 g Oral Daily   predniSONE  5 mg Oral Q breakfast   pregabalin  200 mg Oral BID   sodium chloride flush  10-40 mL Intracatheter Q12H   zinc oxide   Topical Daily    Objective: Vital signs in last 24 hours: Temp:  [97 F (36.1 C)-99.2 F (37.3 C)] 97.7 F (36.5 C) (11/29 1054) Pulse Rate:  [108-123] 122 (11/29 1130) Resp:  [16-31] 25 (11/29 1115) BP: (118-158)/(51-87) 157/82 (11/29 1130) SpO2:  [93 %-100 %] 94 % (11/29 1130) Weight:  [139.7 kg-140.6 kg] 140.6 kg (11/29 0758)  PHYSICAL EXAM:  General: awake  and alert, no distress at rest Lungs: b/l air entry- decreased air entry bases Heart: s1s2 Abdomen: Soft,colostomy  Extremities: rt knee- surgical dressing Hemovact drain present  all four edematous-  B/l feet - dorsum - scabbed wound  Neurologic: grossly non focal  Left shoulder area warm- passive abduction possible Rt mid line foley  Lab Results Recent Labs    07/17/21 0554 07/17/21 0834 07/18/21 0419 07/18/21 0843  WBC  --  8.8 8.1  --   HGB  --  9.4* 10.0* 10.9*  HCT  --  32.7* 34.4* 32.0*  NA 137  --  137 135  K 3.3*  --  2.9* 3.3*  CL 86*  --  89* 88*  CO2 41*  --  40*  --   BUN <5*  --  <5* <3*  CREATININE 0.36*  --  0.42* 0.40*   Liver Panel No results for input(s): PROT, ALBUMIN, AST, ALT, ALKPHOS, BILITOT, BILIDIR, IBILI in the last 72 hours.  Sedimentation Rate No results for input(s): ESRSEDRATE in the last 72 hours. C-Reactive Protein No results for input(s): CRP in the last 72 hours.  Microbiology: 07/08/21 BC- MSSA 07/09/21- BC NG 07/11/21- rt knee synovial  culture- staph aureus     Assessment/Plan: MSSa bacteremia MSSA rt knee septic arthritis- s/p I/D TEE neg Repeat blood culture neg Pt currently on nafcillin Had repeat arthroscopic cwash out today- has a vac drain Will need 6 weeks of Iv antibiotic- Before discharge will change to cefazolin Left shoulder pain with some warmth- if MRI cannot be done recommend CT- will ask Dr.Menz to evaluate the shoulder for septic arthritis  Acute hypoxic resp failure- resolved s/p extubation OSA - not formally diagnosed- CPAP  H/O COVID with long hospitalization in Dec- Fb 2022 , needing tracheostomy and PEG- both have been removed  Polyneuropathy- critical illness related   Complicated diverticulitis , perforation, s/p colectomy and colostomy Discussed the management with patient and  his wife

## 2021-07-18 NOTE — Op Note (Signed)
07/18/2021  10:10 AM  PATIENT:  Craig [name]  30 y.o. male  PRE-OPERATIVE DIAGNOSIS:  Right Septic Knee  POST-OPERATIVE DIAGNOSIS:  Right Septic Knee  PROCEDURE:  Procedure(s): ARTHROSCOPY KNEE, IRRIGATION AND DEBRIDEMENT (Right)  SURGEON: Laurene Footman, MD  ASSISTANTS: None  ANESTHESIA:   general  EBL:  Total I/O In: 100 [IV Piggyback:100] Out: 200 [Urine:200]  BLOOD ADMINISTERED:none  DRAINS: (1) Hemovact drain(s) in the right knee joint with  Suction Open   LOCAL MEDICATIONS USED:  NONE  SPECIMEN:  No Specimen  DISPOSITION OF SPECIMEN:  N/A  COUNTS:  YES  TOURNIQUET:  * Missing tourniquet times found for documented tourniquets in log: [id number] *  IMPLANTS: None  DICTATION: .Dragon Dictation patient was brought to the operating room and after adequate general anesthesia was obtained the right leg was prepped and draped in usual sterile fashion.  Tourniquet was applied to the upper thigh but not utilized during the case.  After appropriate patient identification and timeout procedures were completed and arthroscopy equipment set going through the prior incisions with sutures removed's inferior lateral portal made in the in the interim arthroscope introduced there was no gross pus present through the inferomedial portal a shaver was introduced and the medial gutter was shaved of moderate synovitis much less than on previous case.  Due to switching portals the lateral gutter was debrided in a similar fashion.  Then the knee going to flexion anterior compartment was debrided with moderate synovitis again present with extensive degenerative changes related to prior infection.  After completing the anterior compartment and medial lateral gutters the leg was propped up onto the back table with appropriate padding and through the superior lateral portal a shaver was introduced and suprapatellar pouch shaved with mild synovitis present there a total of 12 L of fluid was  irrigated through the knee when this was completed a superior lateral drain was inserted and the wounds closed using simple erupted 4-0 nylon with Xeroform 4 x 4 web roll and Ace wrap applied.  PLAN OF CARE: Admit to inpatient   PATIENT DISPOSITION:  PACU - hemodynamically stable.

## 2021-07-18 NOTE — Transfer of Care (Signed)
Immediate Anesthesia Transfer of Care Note  Patient: Craig [name]  Procedure(s) Performed: ARTHROSCOPY KNEE, IRRIGATION AND DEBRIDEMENT (Right: Knee)  Patient Location: PACU  Anesthesia Type:General  Level of Consciousness: drowsy and patient cooperative  Airway & Oxygen Therapy: Patient Spontanous Breathing and Patient connected to face mask oxygen  Post-op Assessment: Report given to RN and Post -op Vital signs reviewed and stable  Post vital signs: Reviewed and stable  Last Vitals:  Vitals Value Taken Time  BP 154/87 07/18/21 1017  Temp    Pulse 119 07/18/21 1023  Resp 27 07/18/21 1023  SpO2 100 % 07/18/21 1023  Vitals shown include unvalidated device data.  Last Pain:  Vitals:   07/18/21 0758  TempSrc: Temporal  PainSc: 0-No pain      Patients Stated Pain Goal: 0 (29/02/11 1552)  Complications: No notable events documented.

## 2021-07-18 NOTE — Anesthesia Postprocedure Evaluation (Signed)
Anesthesia Post Note  Patient: Craig [name]  Procedure(s) Performed: ARTHROSCOPY KNEE, IRRIGATION AND DEBRIDEMENT (Right: Knee)  Patient location during evaluation: PACU Anesthesia Type: General Level of consciousness: awake and alert, oriented and patient cooperative Pain management: pain level controlled Vital Signs Assessment: post-procedure vital signs reviewed and stable Respiratory status: spontaneous breathing, nonlabored ventilation and respiratory function stable Cardiovascular status: blood pressure returned to baseline and stable Postop Assessment: adequate PO intake Anesthetic complications: no   No notable events documented.   Last Vitals:  Vitals:   07/18/21 1115 07/18/21 1130  BP: (!) 143/78 (!) 157/82  Pulse: (!) 122 (!) 122  Resp: (!) 25   Temp:    SpO2: 97% 94%    Last Pain:  Vitals:   07/18/21 1105  TempSrc:   PainSc: 2                  Darrin Nipper

## 2021-07-18 NOTE — Anesthesia Preprocedure Evaluation (Addendum)
Anesthesia Evaluation  Patient identified by MRN, date of birth, ID band Patient awake    Reviewed: Allergy & Precautions, NPO status , Patient's Chart, lab work & pertinent test results  History of Anesthesia Complications Negative for: history of anesthetic complications  Airway Mallampati: IV   Neck ROM: Full    Dental no notable dental hx.    Pulmonary sleep apnea ,  Acute-on-chronic respiratory failure on home O2; currently on 3L O2 via [place]; hx tracheostomy 2/2 COVID in early 2022, since removed; hx MSSA PNA  Tachypnea   breath sounds clear to auscultation       Cardiovascular hypertension, Normal cardiovascular exam Rhythm:Regular Rate:Normal  Hx UE DVT, completed course of Xarelto  ECG 07/08/21:  Sinus tachycardia with short PR Rightward axis   Neuro/Psych negative neurological ROS     GI/Hepatic Perforated diverticulitis s/p colectomy with ostomy   Endo/Other  diabetes, Type 2Class 3 obesity  Renal/GU negative Renal ROS     Musculoskeletal Septic right knee joint   Abdominal   Peds  Hematology negative hematology ROS (+)   Anesthesia Other Findings   Reproductive/Obstetrics                            Anesthesia Physical Anesthesia Plan  ASA: 4  Anesthesia Plan: General   Post-op Pain Management:    Induction: Intravenous  PONV Risk Score and Plan: 2 and Ondansetron and Treatment may vary due to age or medical condition  Airway Management Planned: Oral ETT  Additional Equipment:   Intra-op Plan:   Post-operative Plan: Extubation in OR  Informed Consent: I have reviewed the patients History and Physical, chart, labs and discussed the procedure including the risks, benefits and alternatives for the proposed anesthesia with the patient or authorized representative who has indicated his/her understanding and acceptance.     Dental advisory given  Plan Discussed  with: CRNA  Anesthesia Plan Comments: (Patient consented for risks of anesthesia including but not limited to:  - adverse reactions to medications - damage to eyes, teeth, lips or other oral mucosa - nerve damage due to positioning  - sore throat or hoarseness - damage to heart, brain, nerves, lungs, other parts of body or loss of life  Informed patient about role of CRNA in peri- and intra-operative care.  Patient voiced understanding.)       Anesthesia Quick Evaluation

## 2021-07-18 NOTE — TOC Progression Note (Addendum)
Transition of Care [facility]) - Progression Note    Patient Details  Name: Craig [name] MRN: [id number] Date of Birth: [birth year]  Transition of Care [facility]) CM/SW [place], RN Phone Number: 07/18/2021, 1:46 PM  Clinical Narrative:   Spoke with [name] the patient's wife this morning, They have a temerpedic bed at home and want a Hoyer lift, They are refusing STR SNF, He is set up already for [facility] Pt, OT and Nursing they will need New HH orders, He has Oxygen at home and uses [employer], they want a concentrator with the water in it for the home, I reached out to Brogan at [facility] to inquire, it would need to be deemed medically needed and ordered by the Doctor, I notified Dr Billie Ruddy, orders placed, I notified Suanne Marker of the orders   Spoke with [employer] about the humidifier, If the patient is under 4 liters the fluid will accumulate in the line and potentially cause other issues, It is recommended to use a room air humidifier, I spoke to Gisela and explained, she stated to cancel the humidifier. I notified Rhonda at [employer] to cancel the Humidifier   Expected Discharge Plan: [place] Barriers to Discharge: Continued Medical Work up  Expected Discharge Plan and Services Expected Discharge Plan: [place] arrangements for the past 2 months: Single Family Home                                       Social Determinants of Health (SDOH) Interventions    Readmission Risk Interventions Readmission Risk Prevention Plan 07/10/2021  Transportation Screening Complete  PCP or Specialist Appt within 3-5 Days Complete  HRI or [place] Complete  Social Work Consult for [place] Planning/Counseling Complete  Palliative Care Screening Not Applicable  Medication Review Press photographer) Complete  Some recent data might be hidden

## 2021-07-18 NOTE — Progress Notes (Signed)
PROGRESS NOTE    Craig [name]  XMD:[id number] DOB: 05/25/1991 DOA: 07/08/2021 PCP: [name], MD  [id number]   Assessment & Plan:   Principal Problem:   Acute on chronic respiratory failure with hypoxemia [facility]) Active Problems:   Colostomy status ([place])   Pressure injury of skin    Craig [name] is a 30 y.o. male with significant PMH of extensive medical history including inflammatory arthritis on methotrexate and chronic prednisone at 10 mg daily, prior severe Covid-19 infection requiring prolonged hospitalization and tracheostomy (since decannulated) Dec 2021 - Feb 2022, chronic hypoxic and hypercarbic respiratory failure on 2L O2 PRN, MSSA bacteremia, MSSA pneumonia, perforated diverticulitis s/p partial colectomy with ostomy creation, morbid obesity and gout who presented to the ED with chief complaints of progressive shortness of breath. Patient is currently intubated, history mostly obtained from patient's chart and patient's wife who is currently at the bedside.  Per patient's wife, EMS was called today due to worsening shortness of breath and intermittent altered mental status.  [name] stated the patient has been complaining of right knee pain was prescribed Percocet on Monday.  She took the Percocet at Monday and noticed worsening shortness of breath with mental status change per wife.  He also takes tramadol for pain.  No reports of chest pain, nausea vomiting, abdominal pain, diaphoresis, fevers or chills, cough or any other focal neurological symptoms or deficit.  On EMS arrival patient was noted to be hypoxic with a heart rate between 140 to 150 bpm.  He was placed on 6 L to maintain SPO2 greater than 90% and transported to the ED. In the ED, pt was noted to have seizure-like activity.  Pt was intubated, and PCCM consulted for admission and further management.  Pt was transferred to hospitalist service on 07/14/21.   Acute on chronic hypoxic and  hypercarbic respiratory failure On chronic 2L home O2 - Extubated 11/23.  Currently on 3L [place]. --Continue supplemental O2 to keep sats >=90%, wean as tolerated  Sepsis 2/2  MSSA bacteremia  Right septic joint s/p I/D on 07/11/21 -Infectious Disease consulted -s/p TEE - no veg -Repeat blood cultures neg. --purulent drainage noted from the knee wound. Plan: --cont nafcillin --pain control for right knee --repeat I/D with ortho today   Shock -RESOLVED  - Favor sedation/analgesia related hypotension rather than septic. --s/p pressors --cont home prednisone (for rheumatoid arthritis); no indication for stress dose steroids at this time  Acute toxic metabolic encephalopathy, resolved --2/2 sepsis -CT Head wnl   Seizure Like Activity- RESOLVED -CT Head wnl -Loaded with Keppra 1500 mg x 1 -Stopped Keppra; monitor for recurrence of seizure activity, per neuro   H/o upper extremity DVT It appears that he was started on Xarelto before around Feb/Mar 2022 for provoked upper extremity DVT. He has completed an appropriate course of anticoagulation at this time and therapeutic anticoagulation will be discontinued. CTA chest did not reveal PE on admission.   DM2, well controlled --A1c 6.8 --increase glargine to 20u BID --increase mealtime to 10u TID --SSI TID  Probable OSA --look into getting pt qualified for CPAP --CPAP nightly while inpatient  Anasarca --averages about 4L urine output per 1 dose of IV lasix 40 Plan: --IV lasix 40 x1 today due to surgery --strict I/O --will keep Foley for now given >8L urine output per day  Contraction alkalosis, improved --improved after starting diamox --cont diamox  Hypoalbuminemia  --albumin 2.6 --pt refused nepro, supplement changed to clear boost --cont clear  boost TID   DVT prophylaxis: Lovenox SQ Code Status: Full code  Family Communication: wife updated at bedside today  Level of care: Telemetry Surgical Dispo:   The  patient is from: home Anticipated d/c is to: home with [facility] and DME, pt and wife refused SNF Anticipated d/c date is: >3 days Patient currently is not medically ready to d/c due to: just had another OR I/D today   Subjective and Interval History:  Pt went for repeat I/D of right knee today.  Wife reported pt did better this time after the surgery.   Objective: Vitals:   07/18/21 1110 07/18/21 1115 07/18/21 1130 07/18/21 1536  BP:  (!) 143/78 (!) 157/82 130/64  Pulse: (!) 122 (!) 122 (!) 122 (!) 109  Resp: (!) 25 (!) 25  16  Temp:    98.2 F (36.8 C)  TempSrc:      SpO2: 96% 97% 94% 97%  Weight:      Height:        Intake/Output Summary (Last 24 hours) at 07/18/2021 1951 Last data filed at 07/18/2021 1503 Gross per 24 hour  Intake 3052.03 ml  Output 900 ml  Net 2152.03 ml   Filed Weights   07/17/21 0700 07/18/21 0500 07/18/21 0758  Weight: (!) 139.9 kg (!) 139.7 kg (!) 140.6 kg    Examination:   Constitutional: NAD, sleeping CV: No cyanosis.  Tachycardic. RESP: normal respiratory effort, on 3L Extremities: edema in BLE and BUE SKIN: warm, dry  Foley present   Data Reviewed: I have personally reviewed following labs and imaging studies  CBC: Recent Labs  Lab 07/14/21 0702 07/15/21 0641 07/16/21 0559 07/17/21 0834 07/18/21 0419 07/18/21 0843  WBC 9.3 9.4 9.0 8.8 8.1  --   HGB 8.7* 8.8* 9.0* 9.4* 10.0* 10.9*  HCT 30.7* 30.2* 31.3* 32.7* 34.4* 32.0*  MCV 97.5 98.1 97.2 96.2 94.0  --   PLT 254 254 317 297 327  --    Basic Metabolic Panel: Recent Labs  Lab 07/12/21 0635 07/13/21 0409 07/14/21 0702 07/15/21 0641 07/16/21 0559 07/17/21 0554 07/18/21 0419 07/18/21 0843  NA 137 140 138 141 140 137 137 135  K 3.1* 4.2 3.2* 3.4* 3.7 3.3* 2.9* 3.3*  CL 87* 89* 85* 87* 83* 86* 89* 88*  CO2 43* 40* 45* 43* >45* 41* 40*  --   GLUCOSE 104* 155* 155* 155* 157* 223* 214* 234*  BUN _0 <5* <5* <5* <5* <3*  CREATININE 0.57* 0.48* 0.34* 0.34* 0.35* 0.36*  0.42* 0.40*  CALCIUM 8.3* 8.6* 8.6* 8.9 8.9 9.0 9.0  --   MG 1.8 1.7 2.1 1.8 1.7 1.5* 1.8  --   PHOS 2.8 4.2 2.9  --   --   --   --   --    GFR: Estimated Creatinine Clearance: 195.4 mL/min (A) (by C-G formula based on SCr of 0.4 mg/dL (L)). Liver Function Tests: Recent Labs  Lab 07/12/21 0626  AST 15  ALT 24  ALKPHOS 42  BILITOT 0.9  PROT 5.5*  ALBUMIN 2.6*   No results for input(s): LIPASE, AMYLASE in the last 168 hours. No results for input(s): AMMONIA in the last 168 hours. Coagulation Profile: No results for input(s): INR, PROTIME in the last 168 hours.  Cardiac Enzymes: No results for input(s): CKTOTAL, CKMB, CKMBINDEX, TROPONINI in the last 168 hours. BNP (last 3 results) No results for input(s): PROBNP in the last 8760 hours. HbA1C: No results for input(s): HGBA1C in the last 72  hours. CBG: Recent Labs  Lab 07/17/21 1703 07/17/21 2124 07/18/21 1038 07/18/21 1136 07/18/21 1516  GLUCAP 194* 301* 216* 233* 350*   Lipid Profile: No results for input(s): CHOL, HDL, LDLCALC, TRIG, CHOLHDL, LDLDIRECT in the last 72 hours.  Thyroid Function Tests: No results for input(s): TSH, T4TOTAL, FREET4, T3FREE, THYROIDAB in the last 72 hours. Anemia Panel: No results for input(s): VITAMINB12, FOLATE, FERRITIN, TIBC, IRON, RETICCTPCT in the last 72 hours. Sepsis Labs: Recent Labs  Lab 07/15/21 0641  PROCALCITON 0.17    Recent Results (from the past 240 hour(s))  CULTURE, BLOOD (ROUTINE X 2) w Reflex to ID Panel     Status: None   Collection Time: 07/09/21  2:39 PM   Specimen: BLOOD  Result Value Ref Range Status   Specimen Description BLOOD THUMB R  Final   Special Requests   Final    BOTTLES DRAWN AEROBIC AND ANAEROBIC Blood Culture results may not be optimal due to an inadequate volume of blood received in culture bottles   Culture   Final    NO GROWTH 5 DAYS Performed at [facility], [address] [place], [place] [address]    Report Status  07/14/2021 FINAL  Final  CULTURE, BLOOD (ROUTINE X 2) w Reflex to ID Panel     Status: None   Collection Time: 07/09/21  2:50 PM   Specimen: BLOOD RIGHT HAND  Result Value Ref Range Status   Specimen Description BLOOD RIGHT HAND  Final   Special Requests   Final    BOTTLES DRAWN AEROBIC AND ANAEROBIC Blood Culture adequate volume   Culture   Final    NO GROWTH 5 DAYS Performed at [facility], [address] [place], [place] [address]    Report Status 07/14/2021 FINAL  Final  Aerobic/Anaerobic Culture w Gram Stain (surgical/deep wound)     Status: None   Collection Time: 07/11/21  3:29 PM   Specimen: PATH Other; Tissue  Result Value Ref Range Status   Specimen Description   Final    SYNOVIAL Performed at [facility], [address] [place], [place] [address]    Special Requests   Final    RIGHT KNEE Performed at [facility], [place] [place], [place] [address]    Gram Stain   Final    FEW WBC PRESENT, PREDOMINANTLY MONONUCLEAR FEW GRAM POSITIVE COCCI    Culture   Final    RARE STAPHYLOCOCCUS AUREUS NO ANAEROBES ISOLATED Performed at [facility], [place] [address], [place], [place] [address]    Report Status 07/16/2021 FINAL  Final   Organism ID, Bacteria STAPHYLOCOCCUS AUREUS  Final      Susceptibility   Staphylococcus aureus - MIC*    CIPROFLOXACIN <=0.5 SENSITIVE Sensitive     ERYTHROMYCIN RESISTANT Resistant     GENTAMICIN <=0.5 SENSITIVE Sensitive     OXACILLIN <=0.25 SENSITIVE Sensitive     TETRACYCLINE <=1 SENSITIVE Sensitive     VANCOMYCIN 1 SENSITIVE Sensitive     TRIMETH/SULFA <=10 SENSITIVE Sensitive     CLINDAMYCIN RESISTANT Resistant     RIFAMPIN <=0.5 SENSITIVE Sensitive     Inducible Clindamycin POSITIVE Resistant     * RARE STAPHYLOCOCCUS AUREUS  Aerobic/Anaerobic Culture w Gram Stain (surgical/deep wound)     Status: None   Collection Time: 07/11/21  3:29 PM   Specimen: PATH Other; Tissue  Result Value  Ref Range Status   Specimen Description   Final  SYNOVIAL Performed at [facility] [address], [place], [place] [address]    Special Requests   Final    RIGHT KNEE SYNOVIAL FLUID 2 Performed at [facility], [address] [place], [place] [address]    Gram Stain   Final    ABUNDANT WBC PRESENT,BOTH PMN AND MONONUCLEAR MODERATE GRAM POSITIVE COCCI    Culture   Final    MODERATE STAPHYLOCOCCUS AUREUS NO ANAEROBES ISOLATED Performed at [facility], [place] [address], [place], [place] [address]    Report Status 07/16/2021 FINAL  Final   Organism ID, Bacteria STAPHYLOCOCCUS AUREUS  Final      Susceptibility   Staphylococcus aureus - MIC*    CIPROFLOXACIN <=0.5 SENSITIVE Sensitive     ERYTHROMYCIN RESISTANT Resistant     GENTAMICIN <=0.5 SENSITIVE Sensitive     OXACILLIN <=0.25 SENSITIVE Sensitive     TETRACYCLINE <=1 SENSITIVE Sensitive     VANCOMYCIN 1 SENSITIVE Sensitive     TRIMETH/SULFA <=10 SENSITIVE Sensitive     CLINDAMYCIN RESISTANT Resistant     RIFAMPIN <=0.5 SENSITIVE Sensitive     Inducible Clindamycin POSITIVE Resistant     * MODERATE STAPHYLOCOCCUS AUREUS  Aerobic/Anaerobic Culture w Gram Stain (surgical/deep wound)     Status: None   Collection Time: 07/11/21  3:32 PM   Specimen: PATH Other; Tissue  Result Value Ref Range Status   Specimen Description   Final    SYNOVIAL Performed at [facility], [address], [place], [place] [address]    Special Requests   Final    RIGHT KNEE Performed at [facility], [address] [place], [place] [address]    Gram Stain   Final    RARE WBC PRESENT, PREDOMINANTLY MONONUCLEAR NO ORGANISMS SEEN    Culture   Final    No growth aerobically or anaerobically. Performed at [facility], [place] [address] [place], [place] [address]    Report Status 07/16/2021 FINAL  Final  Acid Fast Smear (AFB)     Status: None   Collection Time: 07/11/21  3:32 PM  Result  Value Ref Range Status   AFB Specimen Processing Concentration  Final   Acid Fast Smear Negative  Final    Comment: (NOTE) Performed At: [facility] [place], [place] [id number] Rush Farmer MD [id number]    Source (AFB) FLUID  Final    Comment: FLUID RIGHT KNEE SYNOVIUM Performed at [facility], [address] [place], [place] [address]   Fungus Culture With Stain     Status: None (Preliminary result)   Collection Time: 07/11/21  3:32 PM  Result Value Ref Range Status   Fungus Stain Final report  Final    Comment: (NOTE) Performed At: [facility] [place], [place] [id number] Rush Farmer MD [id number]    Fungus (Mycology) Culture PENDING  Incomplete   Fungal Source KNEE SYNOVIUM  Final    Comment: Performed at [facility], [address] [place],  [address]  Fungus Culture Result     Status: None   Collection Time: 07/11/21  3:32 PM  Result Value Ref Range Status   Result 1 Comment  Final    Comment: (NOTE) KOH/Calcofluor preparation:  no fungus observed. Performed At: [facility] [place], [place] [id number] Rush Farmer MD [id number]   Aerobic/Anaerobic Culture w Gram Stain (surgical/deep wound)     Status: None   Collection Time: 07/11/21  3:34 PM   Specimen: PATH Other; Tissue  Result Value Ref Range Status   Specimen Description   Final    SYNOVIAL Performed at [facility], [address], [place], [place] [address]    Special Requests   Final    RIGHT KNEE 2 Performed at [facility], [address] [place], [place] [address]    Gram Stain   Final    FEW WBC PRESENT, PREDOMINANTLY MONONUCLEAR RARE GRAM POSITIVE COCCI    Culture   Final    RARE STAPHYLOCOCCUS AUREUS SUSCEPTIBILITIES PERFORMED ON PREVIOUS CULTURE WITHIN THE LAST 5 DAYS. NO ANAEROBES ISOLATED Performed at [facility], [place] [address],  [place], [place] [address]    Report Status 07/16/2021 FINAL  Final  Acid Fast Smear (AFB)     Status: None   Collection Time: 07/11/21  3:34 PM   Specimen: PATH Other; Tissue  Result Value Ref Range Status   AFB Specimen Processing Concentration  Final   Acid Fast Smear Negative  Final    Comment: (NOTE) Performed At: [facility] [place], [place] [id number] Rush Farmer MD [id number]    Source (AFB) SYNOVIAL  Final    Comment: Performed at [facility], [address], [place], [place] [address]  Fungus Culture With Stain     Status: None (Preliminary result)   Collection Time: 07/11/21  3:35 PM   Specimen: PATH Other; Tissue  Result Value Ref Range Status   Fungus Stain Final report  Final    Comment: (NOTE) Performed At: [facility] [place], [place] [id number] Rush Farmer MD [id number]    Fungus (Mycology) Culture PENDING  Incomplete   Fungal Source SYNOVIAL  Final    Comment: Performed at [facility], [address], [place], [place] [address]  Aerobic/Anaerobic Culture w Gram Stain (surgical/deep wound)     Status: None   Collection Time: 07/11/21  3:35 PM   Specimen: PATH Other; Tissue  Result Value Ref Range Status   Specimen Description   Final    SYNOVIAL Performed at [facility], [address], [place], [place] [address]    Special Requests   Final    RIGHT KNEE 3 Performed at [facility], [address] [place], [place] [address]    Gram Stain   Final    RARE WBC PRESENT, PREDOMINANTLY MONONUCLEAR NO ORGANISMS SEEN    Culture   Final    RARE STAPHYLOCOCCUS AUREUS SUSCEPTIBILITIES PERFORMED ON PREVIOUS CULTURE WITHIN THE LAST 5 DAYS. CRITICAL RESULT CALLED TO, READ BACK BY AND VERIFIED WITH: RN RANDY RANDY P.  [phone] FCP NO ANAEROBES ISOLATED Performed at [facility], [place] [address], [place], [place] [address]    Report Status 07/16/2021 FINAL   Final  Acid Fast Smear (AFB)     Status: None   Collection Time: 07/11/21  3:35 PM   Specimen: PATH Other; Tissue  Result Value Ref Range Status   AFB Specimen Processing Concentration  Final   Acid Fast Smear Negative  Final    Comment: (NOTE) Performed At: [facility] [place], [place] [id number] Rush Farmer MD [id number]    Source (AFB) SYNOVIAL  Final    Comment: Performed at [facility], [address], [place],  [address]  Fungus Culture Result     Status: None   Collection Time: 07/11/21  3:35 PM  Result Value Ref Range Status  Result 1 Comment  Final    Comment: (NOTE) KOH/Calcofluor preparation:  no fungus observed. Performed At: [facility] [place], [place] [id number] Rush Farmer MD [id number]       Radiology Studies: DG Shoulder Left  Result Date: 07/17/2021 CLINICAL DATA:  Left shoulder pain, no injury. EXAM: LEFT SHOULDER - 2+ VIEW COMPARISON:  None. FINDINGS: Mild degenerative changes in the left acromioclavicular joint. No acute osseous or joint abnormality. IMPRESSION: Mild left acromioclavicular joint osteoarthritis. Electronically Signed   By: Lorin Picket M.D.   On: 07/17/2021 16:54     Scheduled Meds:  acetaZOLAMIDE  250 mg Oral BID   allopurinol  300 mg Oral Daily   vitamin C  500 mg Oral BID   chlorhexidine gluconate (MEDLINE KIT)  15 mL Mouth Rinse BID   Chlorhexidine Gluconate Cloth  6 each Topical Q0600   COVID-19 mRNA bivalent vaccine (Moderna)  0.5 mL Intramuscular Once   docusate sodium  100 mg Oral BID   DULoxetine  60 mg Oral Daily   enoxaparin (LOVENOX) injection  0.5 mg/kg Subcutaneous Q24H   famotidine  20 mg Oral BID   feeding supplement  1 Container Oral TID BM   fentaNYL       folic acid  1 mg Oral Daily   furosemide  40 mg Intravenous BID   insulin aspart  0-20 Units Subcutaneous TID WC   insulin aspart  10 Units Subcutaneous TID WC   insulin  glargine-yfgn  20 Units Subcutaneous BID   lidocaine  1 patch Transdermal Q24H   metoprolol succinate  100 mg Oral Daily   multivitamin with minerals  1 tablet Oral Daily   nicotine  21 mg Transdermal Daily   nystatin   Topical TID   polyethylene glycol  17 g Oral Daily   predniSONE  5 mg Oral Q breakfast   pregabalin  200 mg Oral BID   sodium chloride flush  10-40 mL Intracatheter Q12H   zinc oxide   Topical Daily   Continuous Infusions:  sodium chloride Stopped (07/11/21 1454)   nafcillin (NAFCIL) continuous infusion 12 g (07/18/21 1633)     LOS: 10 days     Enzo Bi, MD [facility] If 7PM-7AM, please contact night-coverage 07/18/2021, 7:51 PM

## 2021-07-18 NOTE — Progress Notes (Signed)
PT Cancellation Note  Patient Details Name: Craig [name] MRN: [id number] DOB: Aug 22, 1990   Cancelled Treatment:    Reason Eval/Treat Not Completed: Patient at procedure or test/unavailable (Per chart pt is OTF for repeat I&D of knee. Will await new orders/updated orders adn resume PT services at later date/time once pt is appropriate.)  8:40 AM, 07/18/21 [name], PT, DPT Physical Therapist - [facility]  [phone] ([place])     Bode C 07/18/2021, 8:40 AM

## 2021-07-18 NOTE — Consult Note (Signed)
Rheumatology follow-up  Right knee drained again. Procedure indicated noted there was a good deal of synovitis. Less pain after procedure. Stiffness left shoulder.  X-ray unremarkable.   He Feels like he is having a harder time getting his left hand to his mouth due shoulder pain.   No other joint complaints Prednisone 5 mg.  Exam: still on O2. Decreased grip left hand.  MCPs not particularly tender Left shoulder can externally rotate and abduct passively.  No palpable effusion.  Rash over his biceps presumably from prior IV infiltration  Impressions: Staph bacteremia and septic right knee joint. Inflammatory arthritis .prednisone and methotrexate Polyneuropathy status post COVID and ICU stay Colostomy status post diverticular abscess Gout, controlled on allopurinol  Recommendations: Will keep on 5 mg prednisone for now  Would like to taper him off but he has been on it for several months and we will do this more slowly as outpatient or after rehab Please Do not restart methotrexate.  Would like to keep him off any immunosuppression .if\\If persistent left shoulder pain, then may need MRI just to be sure there is no evidence of infection but exam is better today

## 2021-07-18 NOTE — Progress Notes (Addendum)
Inpatient Diabetes Program Recommendations  AACE/ADA: New Consensus Statement on Inpatient Glycemic Control  Target Ranges:  Prepandial:   less than 140 mg/dL      Peak postprandial:   less than 180 mg/dL (1-2 hours)      Critically ill patients:  140 - 180 mg/dL     Latest Reference Range & Units 07/17/21 08:48 07/17/21 11:27 07/17/21 17:03 07/17/21 21:24  Glucose-Capillary 70 - 99 mg/dL 193 (H) 268 (H) 194 (H) 301 (H)  (H): Data is abnormally high  Latest Reference Range & Units 07/18/21 04:19  Glucose 70 - 99 mg/dL 214 (H)  (H): Data is abnormally high  Latest Reference Range & Units 07/08/21 18:29  Hemoglobin A1C 4.8 - 5.6 % 6.8 (H)  (H): Data is abnormally high  Review of Glycemic Control  Diabetes history: NO Outpatient Diabetes medications: NA Current orders for Inpatient glycemic control: Semglee 18 units BID, Novolog 8 units TID with meals, Novolog 0-20 units TID with meals; Prednisone 5 mg daily  Inpatient Diabetes Program Recommendations:    Insulin: Please consider increasing Semglee to 20 units BID and meal coverage to Novolog 10 units TID with meals.  NOTE: Patient is currently in PreOp and lab glucose 214 mg/dl this morning at 4:19 am.   Addendum 07/18/21_0 :10-Spoke with patient and wife at bedside about new diabetes diagnosis.  Discussed A1C results (6.8% on 07/08/21) and explained what an A1C is and informed patient that his current A1C indicates an average glucose of 148  mg/dl over the past 2-3 months. Discussed basic pathophysiology of DM Type 2, basic home care, importance of checking CBGs and maintaining good CBG control to prevent long-term and short-term complications. Reviewed glucose and A1C goals.  Reviewed signs and symptoms of hyperglycemia and hypoglycemia along with treatment. Discussed impact of nutrition, exercise, stress, sickness, and medications on diabetes control. Explained that chronic use of steroids are contributing to elevated A1C and provider  may even dx with steroid induced DM. Informed patient that a Living Well with diabetes booklet would be ordered and encouraged patient and spouse to read through entire book. Discussed current insulin regimen and explained that if steroids are continued, he may need to be discharged on insulin. Patient states he does not like to stick himself. [name] states that she has given him B-12 injections before and she also gives herself an injectable SQ medication (not insulin) with a pen so she is familiar with using a pen and would prefer patient be prescribed an insulin pen if discharged on insulin. Encouraged patient to monitor glucose as advised at discharge and take DM medications as prescribed. Encouraged patient to reach out to his PCP if he has any hypoglycemia at all as DM medications may need to be adjusted.  Patient verbalized understanding of information discussed and he states that he has no further questions at this time related to diabetes.   RNs to provide ongoing basic DM education at bedside with this patient and engage patient to actively check blood glucose and administer insulin injections. Ordered Living Well with DM book, insulin pen starter kit, and patient education by bedside RNs to work with patient and wife on insulin administration and allow them to administer insulin injections while inpatient.  Thanks, Barnie Alderman, RN, MSN, CDE Diabetes Coordinator Inpatient Diabetes Program [phone] (Team Pager from 8am to 5pm)

## 2021-07-18 NOTE — Anesthesia Procedure Notes (Signed)
Procedure Name: Intubation Date/Time: 07/18/2021 9:02 AM Performed by: Lia Foyer, CRNA Pre-anesthesia Checklist: Patient identified, Emergency Drugs available, Suction available and Patient being monitored Patient Re-evaluated:Patient Re-evaluated prior to induction Oxygen Delivery Method: Circle system utilized Preoxygenation: Pre-oxygenation with 100% oxygen Induction Type: IV induction Ventilation: Mask ventilation with difficulty Laryngoscope Size: McGraph and 4 Grade View: Grade II Tube type: Oral Tube size: 7.5 mm Number of attempts: 1 Airway Equipment and Method: Stylet, Oral airway and Video-laryngoscopy Placement Confirmation: ETT inserted through vocal cords under direct vision, positive ETCO2 and breath sounds checked- equal and bilateral Secured at: 22 cm Tube secured with: Tape Dental Injury: Teeth and Oropharynx as per pre-operative assessment  Difficulty Due To: Difficulty was anticipated, Difficult Airway- due to large tongue and Difficult Airway- due to limited oral opening Future Recommendations: Recommend- induction with short-acting agent, and alternative techniques readily available

## 2021-07-19 ENCOUNTER — Inpatient Hospital Stay: Payer: Medicaid Other

## 2021-07-19 DIAGNOSIS — J9621 Acute and chronic respiratory failure with hypoxia: Secondary | ICD-10-CM | POA: Diagnosis not present

## 2021-07-19 LAB — BASIC METABOLIC PANEL
Anion gap: 8 (ref 5–15)
BUN: 8 mg/dL (ref 6–20)
CO2: 35 mmol/L — ABNORMAL HIGH (ref 22–32)
Calcium: 8.4 mg/dL — ABNORMAL LOW (ref 8.9–10.3)
Chloride: 94 mmol/L — ABNORMAL LOW (ref 98–111)
Creatinine, Ser: 0.5 mg/dL — ABNORMAL LOW (ref 0.61–1.24)
GFR, Estimated: >60 mL/min (ref >60)
Glucose, Bld: 282 mg/dL — ABNORMAL HIGH (ref 70–99)
Potassium: 4.3 mmol/L (ref 3.5–5.1)
Sodium: 137 mmol/L (ref 135–145)

## 2021-07-19 LAB — CBC
HCT: 33 % — ABNORMAL LOW (ref 39.0–52.0)
Hemoglobin: 9.9 g/dL — ABNORMAL LOW (ref 13.0–17.0)
MCH: 28.6 pg (ref 26.0–34.0)
MCHC: 30 g/dL (ref 30.0–36.0)
MCV: 95.4 fL (ref 80.0–100.0)
Platelets: 323 10*3/uL (ref 150–400)
RBC: 3.46 MIL/uL — ABNORMAL LOW (ref 4.22–5.81)
RDW: 19.2 % — ABNORMAL HIGH (ref 11.5–15.5)
WBC: 9.9 10*3/uL (ref 4.0–10.5)
nRBC: 0.8 % — ABNORMAL HIGH (ref 0.0–0.2)

## 2021-07-19 LAB — GLUCOSE, CAPILLARY
Glucose-Capillary: 273 mg/dL — ABNORMAL HIGH (ref 70–99)
Glucose-Capillary: 353 mg/dL — ABNORMAL HIGH (ref 70–99)

## 2021-07-19 LAB — MAGNESIUM: Magnesium: 1.8 mg/dL (ref 1.7–2.4)

## 2021-07-19 MED ORDER — INSULIN GLARGINE-YFGN 100 UNIT/ML ~~LOC~~ SOLN
24.0000 [IU] | Freq: Two times a day (BID) | SUBCUTANEOUS | Status: DC
  Administered 2021-07-20 – 2021-07-21 (×3): 24 [IU] via SUBCUTANEOUS
  Filled 2021-07-19 (×4): qty 0.24

## 2021-07-19 MED ORDER — NEPRO/CARBSTEADY PO LIQD: 237.0000 mL | Freq: Three times a day (TID) | ORAL | Status: DC

## 2021-07-19 MED ORDER — ENOXAPARIN SODIUM 80 MG/0.8ML IJ SOSY
0.5000 mg/kg | PREFILLED_SYRINGE | INTRAMUSCULAR | Status: DC
  Administered 2021-07-19 – 2021-07-24 (×6): 70 mg via SUBCUTANEOUS
  Filled 2021-07-19 (×7): qty 0.7

## 2021-07-19 NOTE — Progress Notes (Signed)
PROGRESS NOTE    RICHERD GRIME  AST:[id number] DOB: 18-Mar-1991 DOA: 07/08/2021 PCP: [name], MD  [id number]   Assessment & Plan:   Principal Problem:   Acute on chronic respiratory failure with hypoxemia [facility]) Active Problems:   Colostomy status ([place])   Pressure injury of skin    Craig [name] is a 30 y.o. male with significant PMH of extensive medical history including inflammatory arthritis on methotrexate and chronic prednisone at 10 mg daily, prior severe Covid-19 infection requiring prolonged hospitalization and tracheostomy (since decannulated) Dec 2021 - Feb 2022, chronic hypoxic and hypercarbic respiratory failure on 2L O2 PRN, MSSA bacteremia, MSSA pneumonia, perforated diverticulitis s/p partial colectomy with ostomy creation, morbid obesity and gout who presented to the ED with chief complaints of progressive shortness of breath. Patient is currently intubated, history mostly obtained from patient's chart and patient's wife who is currently at the bedside.  Per patient's wife, EMS was called today due to worsening shortness of breath and intermittent altered mental status.  [name] stated the patient has been complaining of right knee pain was prescribed Percocet on Monday.  She took the Percocet at Monday and noticed worsening shortness of breath with mental status change per wife.  He also takes tramadol for pain.  No reports of chest pain, nausea vomiting, abdominal pain, diaphoresis, fevers or chills, cough or any other focal neurological symptoms or deficit.  On EMS arrival patient was noted to be hypoxic with a heart rate between 140 to 150 bpm.  He was placed on 6 L to maintain SPO2 greater than 90% and transported to the ED. In the ED, pt was noted to have seizure-like activity.  Pt was intubated, and PCCM consulted for admission and further management.  Pt was transferred to hospitalist service on 07/14/21.   Acute on chronic hypoxic and  hypercarbic respiratory failure On chronic 2L home O2 - Extubated 11/23.  Currently on 3L [place]. --Continue supplemental O2 to keep sats >=90%, wean as tolerated  Sepsis 2/2  MSSA bacteremia  Right septic joint s/p I/D on 07/11/21 and again on 07/18/21 -Infectious Disease consulted -s/p TEE - no veg -Repeat blood cultures neg. --purulent drainage noted from the knee wound so pt was taken to OR for 2nd I/D. Plan: --cont nafcillin, Will need 6 weeks of Iv antibiotic.  Before discharge will change to cefazolin, per ID. --pain control for right knee --cont Hemovac, to be managed by ortho  Left shoulder pain --Xray of left shoulder negative --MRI of left shoulder to rule out septic joint   Shock -RESOLVED  - Favor sedation/analgesia related hypotension rather than septic. --s/p pressors --cont home prednisone (for rheumatoid arthritis); no indication for stress dose steroids at this time  Acute toxic metabolic encephalopathy, resolved --2/2 sepsis -CT Head wnl   Seizure Like Activity- RESOLVED -CT Head wnl -Loaded with Keppra 1500 mg x 1 -Stopped Keppra; monitor for recurrence of seizure activity, per neuro   H/o upper extremity DVT It appears that he was started on Xarelto before around Feb/Mar 2022 for provoked upper extremity DVT. He has completed an appropriate course of anticoagulation at this time and therapeutic anticoagulation will be discontinued. CTA chest did not reveal PE on admission.   DM2, well controlled --A1c 6.8 --increase glargine to 24u BID --cont mealtime 10u TID --SSI TID  Probable OSA --look into getting pt qualified for CPAP --CPAP nightly while inpatient  Anasarca --averages about 4L urine output per 1 dose of IV lasix  40 Plan: --cont IV lasix 40 mg BID --strict I/O --d/c Foley today  Contraction alkalosis, improved --improved after starting diamox --cont diamox for now  Hypoalbuminemia  --albumin 2.6 --pt refused nepro, supplement changed  to clear boost --cont clear boost TID (however, dieticians keep changing it back to Nepro).   Complicated diverticulitis , perforation, s/p colectomy and colostomy --monitor output --change scheduled Miralax and Colace to PRN due to watery output    DVT prophylaxis: Lovenox SQ Code Status: Full code  Family Communication: mother updated at bedside today  Level of care: Telemetry Surgical Dispo:   The patient is from: home Anticipated d/c is to: home with [facility] and DME, pt and wife refused SNF  Anticipated d/c date is: >3 days Patient currently is not medically ready to d/c due to: need ortho to clear, still on IV lasix for aggressive volume removal   Subjective and Interval History:  Eating better now.  Ostomy output becoming watery on bowel regimen.  Still good urine output.  Pt complained that something hard in his mattress has been poking into his back.   Objective: Vitals:   07/19/21 0759 07/19/21 1206 07/19/21 1736 07/19/21 1954  BP: (!) 122/55 (!) 111/53 (!) 112/57 (!) 117/49  Pulse: (!) 101  (!) 104 (!) 102  Resp: _0 Temp: 98.3 F (36.8 C) 99.3 F (37.4 C) 98.8 F (37.1 C) 98.3 F (36.8 C)  TempSrc: Oral     SpO2: 100%  98% 98%  Weight:      Height:        Intake/Output Summary (Last 24 hours) at 07/19/2021 2137 Last data filed at 07/19/2021 2121 Gross per 24 hour  Intake 350 ml  Output 7100 ml  Net -6750 ml   Filed Weights   07/18/21 0500 07/18/21 0758 07/19/21 0500  Weight: (!) 139.7 kg (!) 140.6 kg (!) 140.9 kg    Examination:   Constitutional: NAD, AAOx3 HEENT: conjunctivae and lids normal, EOMI CV: No cyanosis.   RESP: normal respiratory effort, on 3L GI: dark watery output in ostomy bag Extremities: edema in BLE and BUE SKIN: warm, dry Neuro: II - XII grossly intact.   Psych: Normal mood and affect.  Appropriate judgement and reason  Foley present   Data Reviewed: I have personally reviewed following labs and imaging  studies  CBC: Recent Labs  Lab 07/15/21 0641 07/16/21 0559 07/17/21 0834 07/18/21 0419 07/18/21 0843 07/19/21 0301  WBC 9.4 9.0 8.8 8.1  --  9.9  HGB 8.8* 9.0* 9.4* 10.0* 10.9* 9.9*  HCT 30.2* 31.3* 32.7* 34.4* 32.0* 33.0*  MCV 98.1 97.2 96.2 94.0  --  95.4  PLT 254 317 297 327  --  811   Basic Metabolic Panel: Recent Labs  Lab 07/13/21 0409 07/14/21 0702 07/15/21 0641 07/16/21 0559 07/17/21 0554 07/18/21 0419 07/18/21 0843 07/19/21 0301  NA 140 138 141 140 137 137 135 137  K 4.2 3.2* 3.4* 3.7 3.3* 2.9* 3.3* 4.3  CL 89* 85* 87* 83* 86* 89* 88* 94*  CO2 40* 45* 43* >45* 41* 40*  --  35*  GLUCOSE 155* 155* 155* 157* 223* 214* 234* 282*  BUN 7 6 <5* <5* <5* <5* <3* 8  CREATININE 0.48* 0.34* 0.34* 0.35* 0.36* 0.42* 0.40* 0.50*  CALCIUM 8.6* 8.6* 8.9 8.9 9.0 9.0  --  8.4*  MG 1.7 2.1 1.8 1.7 1.5* 1.8  --  1.8  PHOS 4.2 2.9  --   --   --   --   --   --  GFR: Estimated Creatinine Clearance: 195.6 mL/min (A) (by C-G formula based on SCr of 0.5 mg/dL (L)). Liver Function Tests: No results for input(s): AST, ALT, ALKPHOS, BILITOT, PROT, ALBUMIN in the last 168 hours.  No results for input(s): LIPASE, AMYLASE in the last 168 hours. No results for input(s): AMMONIA in the last 168 hours. Coagulation Profile: No results for input(s): INR, PROTIME in the last 168 hours.  Cardiac Enzymes: No results for input(s): CKTOTAL, CKMB, CKMBINDEX, TROPONINI in the last 168 hours. BNP (last 3 results) No results for input(s): PROBNP in the last 8760 hours. HbA1C: No results for input(s): HGBA1C in the last 72 hours. CBG: Recent Labs  Lab 07/18/21 1136 07/18/21 1516 07/18/21 2100 07/19/21 0759 07/19/21 1201  GLUCAP 233* 350* 315* 273* 353*   Lipid Profile: No results for input(s): CHOL, HDL, LDLCALC, TRIG, CHOLHDL, LDLDIRECT in the last 72 hours.  Thyroid Function Tests: No results for input(s): TSH, T4TOTAL, FREET4, T3FREE, THYROIDAB in the last 72 hours. Anemia  Panel: No results for input(s): VITAMINB12, FOLATE, FERRITIN, TIBC, IRON, RETICCTPCT in the last 72 hours. Sepsis Labs: Recent Labs  Lab 07/15/21 0641  PROCALCITON 0.17    Recent Results (from the past 240 hour(s))  Aerobic/Anaerobic Culture w Gram Stain (surgical/deep wound)     Status: None   Collection Time: 07/11/21  3:29 PM   Specimen: PATH Other; Tissue  Result Value Ref Range Status   Specimen Description   Final    SYNOVIAL Performed at [facility], [address], [place], [place] [address]    Special Requests   Final    RIGHT KNEE Performed at [facility], [address] [place], [place] [address]    Gram Stain   Final    FEW WBC PRESENT, PREDOMINANTLY MONONUCLEAR FEW GRAM POSITIVE COCCI    Culture   Final    RARE STAPHYLOCOCCUS AUREUS NO ANAEROBES ISOLATED Performed at [facility], [place] [address], [place], [place] [address]    Report Status 07/16/2021 FINAL  Final   Organism ID, Bacteria STAPHYLOCOCCUS AUREUS  Final      Susceptibility   Staphylococcus aureus - MIC*    CIPROFLOXACIN <=0.5 SENSITIVE Sensitive     ERYTHROMYCIN RESISTANT Resistant     GENTAMICIN <=0.5 SENSITIVE Sensitive     OXACILLIN <=0.25 SENSITIVE Sensitive     TETRACYCLINE <=1 SENSITIVE Sensitive     VANCOMYCIN 1 SENSITIVE Sensitive     TRIMETH/SULFA <=10 SENSITIVE Sensitive     CLINDAMYCIN RESISTANT Resistant     RIFAMPIN <=0.5 SENSITIVE Sensitive     Inducible Clindamycin POSITIVE Resistant     * RARE STAPHYLOCOCCUS AUREUS  Aerobic/Anaerobic Culture w Gram Stain (surgical/deep wound)     Status: None   Collection Time: 07/11/21  3:29 PM   Specimen: PATH Other; Tissue  Result Value Ref Range Status   Specimen Description   Final    SYNOVIAL Performed at [facility], [address] [place], [place] [address]    Special Requests   Final    RIGHT KNEE SYNOVIAL FLUID 2 Performed at [facility], [address] [place], [place]  [address]    Gram Stain   Final    ABUNDANT WBC PRESENT,BOTH PMN AND MONONUCLEAR MODERATE GRAM POSITIVE COCCI    Culture   Final    MODERATE STAPHYLOCOCCUS AUREUS NO ANAEROBES ISOLATED Performed at [facility], [place] [address], [place],  [address]    Report Status 07/16/2021 FINAL  Final  Organism ID, Bacteria STAPHYLOCOCCUS AUREUS  Final      Susceptibility   Staphylococcus aureus - MIC*    CIPROFLOXACIN <=0.5 SENSITIVE Sensitive     ERYTHROMYCIN RESISTANT Resistant     GENTAMICIN <=0.5 SENSITIVE Sensitive     OXACILLIN <=0.25 SENSITIVE Sensitive     TETRACYCLINE <=1 SENSITIVE Sensitive     VANCOMYCIN 1 SENSITIVE Sensitive     TRIMETH/SULFA <=10 SENSITIVE Sensitive     CLINDAMYCIN RESISTANT Resistant     RIFAMPIN <=0.5 SENSITIVE Sensitive     Inducible Clindamycin POSITIVE Resistant     * MODERATE STAPHYLOCOCCUS AUREUS  Aerobic/Anaerobic Culture w Gram Stain (surgical/deep wound)     Status: None   Collection Time: 07/11/21  3:32 PM   Specimen: PATH Other; Tissue  Result Value Ref Range Status   Specimen Description   Final    SYNOVIAL Performed at [facility], [address] [place], [place] [address]    Special Requests   Final    RIGHT KNEE Performed at [facility], [address] [place], [place] [address]    Gram Stain   Final    RARE WBC PRESENT, PREDOMINANTLY MONONUCLEAR NO ORGANISMS SEEN    Culture   Final    No growth aerobically or anaerobically. Performed at [facility], [place] [address], [place], [place] [address]    Report Status 07/16/2021 FINAL  Final  Acid Fast Smear (AFB)     Status: None   Collection Time: 07/11/21  3:32 PM  Result Value Ref Range Status   AFB Specimen Processing Concentration  Final   Acid Fast Smear Negative  Final    Comment: (NOTE) Performed At: [facility] [place], [place] [id number] Rush Farmer MD [id number]    Source (AFB) FLUID  Final    Comment:  FLUID RIGHT KNEE SYNOVIUM Performed at [facility], [address] [place], [place] [address]   Fungus Culture With Stain     Status: None (Preliminary result)   Collection Time: 07/11/21  3:32 PM  Result Value Ref Range Status   Fungus Stain Final report  Final    Comment: (NOTE) Performed At: [facility] [place], [place] [id number] Rush Farmer MD [id number]    Fungus (Mycology) Culture PENDING  Incomplete   Fungal Source KNEE SYNOVIUM  Final    Comment: Performed at [facility], [address] [place], [place] [address]  Fungus Culture Result     Status: None   Collection Time: 07/11/21  3:32 PM  Result Value Ref Range Status   Result 1 Comment  Final    Comment: (NOTE) KOH/Calcofluor preparation:  no fungus observed. Performed At: [facility] [place], [place] [id number] Rush Farmer MD [id number]   Aerobic/Anaerobic Culture w Gram Stain (surgical/deep wound)     Status: None   Collection Time: 07/11/21  3:34 PM   Specimen: PATH Other; Tissue  Result Value Ref Range Status   Specimen Description   Final    SYNOVIAL Performed at [facility], [address], [place], [place] [address]    Special Requests   Final    RIGHT KNEE 2 Performed at [facility], [address] [place], [place] [address]    Gram Stain   Final    FEW WBC PRESENT, PREDOMINANTLY MONONUCLEAR RARE GRAM POSITIVE COCCI    Culture   Final    RARE STAPHYLOCOCCUS AUREUS SUSCEPTIBILITIES PERFORMED ON PREVIOUS CULTURE  WITHIN THE LAST 5 DAYS. NO ANAEROBES ISOLATED Performed at [facility], [place] [address] [place], [place] [address]    Report Status 07/16/2021 FINAL  Final  Acid Fast Smear (AFB)     Status: None   Collection Time: 07/11/21  3:34 PM   Specimen: PATH Other; Tissue  Result Value Ref Range Status   AFB Specimen Processing Concentration  Final   Acid Fast Smear Negative  Final     Comment: (NOTE) Performed At: [facility] [place], [place] [id number] Rush Farmer MD IN:[id number]    Source (AFB) SYNOVIAL  Final    Comment: Performed at [facility], [address], [place], [place] [address]  Fungus Culture With Stain     Status: None (Preliminary result)   Collection Time: 07/11/21  3:35 PM   Specimen: PATH Other; Tissue  Result Value Ref Range Status   Fungus Stain Final report  Final    Comment: (NOTE) Performed At: [facility] [place], [place] [id number] Rush Farmer MD [id number]    Fungus (Mycology) Culture PENDING  Incomplete   Fungal Source SYNOVIAL  Final    Comment: Performed at [facility], [address] [place], [place] [address]  Aerobic/Anaerobic Culture w Gram Stain (surgical/deep wound)     Status: None   Collection Time: 07/11/21  3:35 PM   Specimen: PATH Other; Tissue  Result Value Ref Range Status   Specimen Description   Final    SYNOVIAL Performed at [facility], [address], [place], [place] [address]    Special Requests   Final    RIGHT KNEE 3 Performed at [facility], [address] [place], [place] [address]    Gram Stain   Final    RARE WBC PRESENT, PREDOMINANTLY MONONUCLEAR NO ORGANISMS SEEN    Culture   Final    RARE STAPHYLOCOCCUS AUREUS SUSCEPTIBILITIES PERFORMED ON PREVIOUS CULTURE WITHIN THE LAST 5 DAYS. CRITICAL RESULT CALLED TO, READ BACK BY AND VERIFIED WITH: RN RANDY RANDY P.  [phone] FCP NO ANAEROBES ISOLATED Performed at [facility], [place] [address] [place], [place] [address]    Report Status 07/16/2021 FINAL  Final  Acid Fast Smear (AFB)     Status: None   Collection Time: 07/11/21  3:35 PM   Specimen: PATH Other; Tissue  Result Value Ref Range Status   AFB Specimen Processing Concentration  Final   Acid Fast Smear Negative  Final    Comment: (NOTE) Performed At: [facility] [place], [place] [id number] Rush Farmer MD [id number]    Source (AFB) SYNOVIAL  Final    Comment: Performed at [facility], [address] [place],  [address]  Fungus Culture Result     Status: None   Collection Time: 07/11/21  3:35 PM  Result Value Ref Range Status   Result 1 Comment  Final    Comment: (NOTE) KOH/Calcofluor preparation:  no fungus observed. Performed At: [facility] [place], [place] [id number] Rush Farmer MD [id number]       Radiology Studies: No results found.   Scheduled Meds:  acetaZOLAMIDE  250 mg Oral BID   allopurinol  300 mg Oral Daily   vitamin C  500 mg Oral BID   chlorhexidine gluconate (MEDLINE KIT)  15 mL Mouth Rinse BID   Chlorhexidine Gluconate Cloth  6 each Topical Q0600   COVID-19 mRNA bivalent vaccine (Moderna)  0.5 mL Intramuscular Once   DULoxetine  60 mg Oral Daily   enoxaparin (LOVENOX) injection  0.5 mg/kg Subcutaneous Q24H   famotidine  20 mg Oral BID   feeding supplement (NEPRO CARB STEADY)  237 mL Oral TID BM   folic acid  1 mg Oral Daily   furosemide  40 mg Intravenous BID   insulin aspart  0-20 Units Subcutaneous TID WC   insulin aspart  10 Units Subcutaneous TID WC   insulin glargine-yfgn  20 Units Subcutaneous BID   lidocaine  1 patch Transdermal Q24H   metoprolol succinate  100 mg Oral Daily   multivitamin with minerals  1 tablet Oral Daily   nicotine  21 mg Transdermal Daily   nystatin   Topical TID   predniSONE  5 mg Oral Q breakfast   pregabalin  200 mg Oral BID   sodium chloride flush  10-40 mL Intracatheter Q12H   zinc oxide   Topical Daily   Continuous Infusions:  sodium chloride Stopped (07/11/21 1454)   nafcillin (NAFCIL) continuous infusion 12 g (07/19/21 1546)     LOS: 11 days     Enzo Bi, MD [facility] If 7PM-7AM, please contact night-coverage 07/19/2021, 9:37 PM

## 2021-07-19 NOTE — Progress Notes (Signed)
   Subjective: 1 Day Post-Op Procedure(s) (LRB): ARTHROSCOPY KNEE, IRRIGATION AND DEBRIDEMENT (Right) Patient reports right knee pain is mild.  No complaints.  Hemovac intact.  Patient continues to have left shoulder pain, this is improving.  Has limited active range of motion.  Patient seen improvement with Lidoderm patch.   Objective: Vital signs in last 24 hours: Temp:  [97.9 F (36.6 C)-99.3 F (37.4 C)] 99.3 F (37.4 C) (11/30 1206) Pulse Rate:  [100-110] 101 (11/30 0759) Resp:  [16-19] 16 (11/30 1206) BP: (111-138)/(53-70) 111/53 (11/30 1206) SpO2:  [97 %-100 %] 100 % (11/30 0759) Weight:  [140.9 kg] 140.9 kg (11/30 0500)  Intake/Output from previous day: 11/29 0701 - 11/30 0700 In: 2495 [P.O.:45; I.V.:900; IV Piggyback:1550] Out: 3375 [Urine:3125; Stool:250] Intake/Output this shift: Total I/O In: 350 [P.O.:350] Out: 0   Recent Labs    07/17/21 0834 07/18/21 0419 07/18/21 0843 07/19/21 0301  HGB 9.4* 10.0* 10.9* 9.9*   Recent Labs    07/18/21 0419 07/18/21 0843 07/19/21 0301  WBC 8.1  --  9.9  RBC 3.66*  --  3.46*  HCT 34.4* 32.0* 33.0*  PLT 327  --  323   Recent Labs    07/18/21 0419 07/18/21 0843 07/19/21 0301  NA 137 135 137  K 2.9* 3.3* 4.3  CL 89* 88* 94*  CO2 40*  --  35*  BUN <5* <3* 8  CREATININE 0.42* 0.40* 0.50*  GLUCOSE 214* 234* 282*  CALCIUM 9.0  --  8.4*   No results for input(s): LABPT, INR in the last 72 hours.  EXAM General - Patient is Alert, Appropriate, and Oriented Right lower extremity - Neurovascular intact Sensation intact distally Intact pulses distally Dorsiflexion/Plantar flexion intact Hemovac intact Ace wrap in tact, no drainage Left upper extremity: Active abduction to 90 degrees, passively I can increase him to 120 without any significant pain.  He has minimal pain with external rotation but normal range of motion with internal and external rotation.  No warmth or redness.  Lidoderm patch intact   Past  Medical History:  Diagnosis Date   Gout    Hypertension    Rupture of bowel (HCC)     Assessment/Plan:   1 Day Post-Op Procedure(s) (LRB): ARTHROSCOPY KNEE, IRRIGATION AND DEBRIDEMENT (Right) Principal Problem:   Acute on chronic respiratory failure with hypoxemia (HCC) Active Problems:   Colostomy status (HCC)   Pressure injury of skin  Estimated body mass index is 40.98 kg/m as calculated from the following:   Height as of this encounter: 6\' 1"  (1.854 m).   Weight as of this encounter: 140.9 kg.  Continue with IV antibiotics per infectious disease.  Will remove Hemovac from right knee tomorrow morning.  Xray of left shoulder negative, will order MRI of the left shoulder to evaluate for possible septic joint.  Patient feels as if he can tolerate lying down for 15 minutes, this should be enough time to obtain coronal and axial T2 images.   Ronney Asters, PA-C [place] 07/19/2021, 1:16 PM

## 2021-07-19 NOTE — Progress Notes (Signed)
Inpatient Diabetes Program Recommendations  AACE/ADA: New Consensus Statement on Inpatient Glycemic Control  Target Ranges:  Prepandial:   less than 140 mg/dL      Peak postprandial:   less than 180 mg/dL (1-2 hours)      Critically ill patients:  140 - 180 mg/dL    Latest Reference Range & Units 07/18/21 10:38 07/18/21 11:36 07/18/21 15:16 07/18/21 21:00 07/19/21 07:59  Glucose-Capillary 70 - 99 mg/dL 216 (H) 233 (H) 350 (H) 315 (H) 273 (H)   Review of Glycemic Control  Diabetes history: NO Outpatient Diabetes medications: NA Current orders for Inpatient glycemic control: Semglee 20 units BID, Novolog 10 units TID with meals, Novolog 0-20 units TID with meals; Prednisone 5 mg daily   Inpatient Diabetes Program Recommendations:     Insulin: Please consider increasing Semglee to 24 units BID.   Outpatient DM management: At discharge, please provide Rx for glucose monitoring kit (#[id number]). If patient will be discharged on insulin, he would prefer insulin pens.  NOTE: Noted Prednisone decreased to 5 mg daily. Patient received Decadron 10 mg x1 on 07/18/21 which is contributing to hyperglycemia. Glucose ranged from 216-350 mg/dl on 07/18/21 and fasting glucose 273 mg/dl today.   Thanks, Barnie Alderman, RN, MSN, CDE Diabetes Coordinator Inpatient Diabetes Program [phone] (Team Pager from 8am to 5pm)

## 2021-07-19 NOTE — Evaluation (Signed)
Physical Therapy Evaluation Patient Details Name: Craig [name] MRN: [id number] DOB: [birth year] Today's Date: 07/19/2021  History of Present Illness  Craig [name] is a 30 y/o M admitted on 07/08/21 after presenting to the ED with c/o SOB & intermittent AMS. Pt was c/o R knee pain & perscribed pain medication on Monday. On arrival pt was found to be hypoxic with elevated HR & during exam was noted to have seizure like activity lasting <60 seconds. Pt is being treated for septic encephalopathy. Knee cultures revealed staph aureus & septic arthritis. 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 OP Neurology did EMG study June 2022 for paresthesias bilat: "This is an abnormal electrodiagnostic study consistent with a 1) generalized severe sensorimotor polyneuropathy. 2) Superimposed left radial neuropathy 3)Can't rule out C5 radiculopathy. "  Clinical Impression  Pt admitted with above diagnosis. Pt currently with functional limitations due to the deficits listed below (see "PT Problem List"). Upon entry, pt in bed, awake and agreeable to participate. The pt is alert, pleasant, interactive, and able to provide info regarding prior level of function, both in tolerance and independence. Rt knee pain improved s/p 2nd washout, pain free at rest, but still stiff, painful, and severely weak with attempted ranging or activation. Pt assisted with AA/ROM and AR/ROM or BLE and RUE as tolerated, pain is a limiter, but weakness is far more limiting and more substantial than prior to admission. Pt educated on edema management in BLE, RUE, and pulmonary toilet with daily HOB uprights. Family reports ability to provide extensive care for pt at DC, but they will  need hoyer lift in home again, as he recently was liberated from it. Patient's performance this date reveals decreased ability, independence, and tolerance in performing all basic mobility required for performance of activities of daily living. Pt requires additional DME, close physical assistance, and cues for safe participate in mobility. Pt will benefit from skilled PT intervention to increase independence and safety with basic mobility in preparation for discharge to the venue listed below.          Recommendations for follow up therapy are one component of a multi-disciplinary discharge planning process, led by the attending physician.  Recommendations may be updated based on patient status, additional functional criteria and insurance authorization.  Follow Up Recommendations Home health PT (Family prefers DC to home; will need hoyer lift again to manage patient)    Assistance Recommended at Discharge Frequent or constant Supervision/Assistance  Functional Status Assessment Patient has had a recent decline in their functional status and demonstrates the ability to make significant improvements in function in a reasonable and predictable amount of time.  Equipment Recommendations       Recommendations for Other Services       Precautions / Restrictions Precautions Precautions: Fall Restrictions Weight Bearing Restrictions: Yes RLE Weight Bearing: Weight bearing as tolerated      Mobility  Bed Mobility Overal bed mobility:  (deferred due to bed level exam and severe weakness) Bed Mobility: Supine to Sit;Sit to Supine     Supine to sit: Total assist Sit to supine: Total assist        Transfers                   General transfer comment: deferred for safety    Ambulation/Gait  Stairs            Wheelchair Mobility    Modified Rankin (Stroke Patients Only)       Balance Overall balance assessment: Needs  assistance Sitting-balance support: Feet supported;Bilateral upper extremity supported Sitting balance-Leahy Scale: Fair Sitting balance - Comments: supervision static sitting                                     Pertinent Vitals/Pain Pain Assessment:  (4/10 Left shoulder, more on Right knee with movement ranging only) Faces Pain Scale: Hurts whole lot Pain Location: RLE Pain Descriptors / Indicators: Grimacing;Sore Pain Intervention(s): Limited activity within patient's tolerance;Monitored during session;Premedicated before session    [place] expects to be discharged to:: Private residence (been living at parent's house since discharge from covid in Feb) Living Arrangements: Spouse/significant other;Children;Parent (son and daughter ages 9 and 56) Available Help at Discharge: Family;Available 24 hours/day Type of Home: House Home Access: Ramped entrance       Home Layout: Multi-level;Able to live on main level with bedroom/bathroom Home Equipment: Hospital bed;Wheelchair - Water quality scientist (4 wheels) Additional Comments: hospital bed comng on 12/2; grab bars on wall, lift chair (got rid of hoyer lift requesting another one; leg rests does not have elevated' leg rests (WC borrowed, needs a size approprate one)    Prior Function               Mobility Comments: Pt was working with HHPT from Feb-June/July 2022 & was able to ambulate with RW until R knee pain began. Since then pt has been primarily sleeping/staying in his lift chair (cannot tolerate lying in hospital bed). Wife reports he is able to use grab bars, lift chair +2 family assistance to transfer lift chair>w/c & they bathe him outside with the water hose.       Hand Dominance   Dominant Hand: Right    Extremity/Trunk Assessment   Upper Extremity Assessment Upper Extremity Assessment: Generalized weakness    Lower Extremity Assessment Lower Extremity Assessment: Generalized  weakness       Communication      Cognition Arousal/Alertness: Awake/alert Behavior During Therapy: WFL for tasks assessed/performed Overall Cognitive Status: Within Functional Limits for tasks assessed                                 General Comments: pleasant and cooperative        General Comments      Exercises General Exercises - Upper Extremity Shoulder Flexion: AAROM;PROM;Right;20 reps;10 reps;Limitations Shoulder Flexion Limitations: semirecumbent overhead reach; pt never regained full overhead movement, has chronic severe shoulder weakness s/p ICU care. 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 Short Arc QuadSinclair Ship;Both;10 reps;Supine;Limitations Short Arc Quad Limitations: limited range due to chornic polyneuropathy with acute severe exacerbation; active assistance for range and resistance for strengthening. Hip ABduction/ADduction: AAROM;Left;Supine;15 reps Straight Leg Raises: AAROM;Right;10 reps;Limitations Straight Leg Raises Limitations: appears severely painful Hip Flexion/Marching: Supine;AAROM;Strengthening;Both;Limitations Hip Flexion/Marching Limitations: limited range due to chornic polyneuropathy with acute severe exacerbation; active assistance for range and resistance for strengthening. Resistance for extension on Left side only   Assessment/Plan    PT Assessment Patient needs continued PT services  PT Problem List Decreased strength;Decreased mobility;Obesity;Decreased range of motion;Decreased activity tolerance;Cardiopulmonary status limiting activity;Decreased skin integrity;Decreased knowledge of use of DME;Decreased balance       PT Treatment Interventions  DME instruction;Therapeutic activities;Modalities;Gait training;Therapeutic exercise;Patient/family education;Stair training;Balance training;Functional mobility training;Manual techniques;Neuromuscular re-education;Wheelchair mobility training    PT Goals (Current goals can be found in the Care Plan section)  Acute Rehab PT Goals Patient Stated Goal: get better PT Goal Formulation: With patient/family Time For Goal Achievement: 07/28/21 Potential to Achieve Goals: Fair    Frequency Min 2X/week   Barriers to discharge        Co-evaluation               AM-PAC PT "6 Clicks" Mobility  Outcome Measure Help needed turning from your back to your side while in a flat bed without using bedrails?: Total Help needed moving from lying on your back to sitting on the side of a flat bed without using bedrails?: Total Help needed moving to and from a bed to a chair (including a wheelchair)?: Total Help needed standing up from a chair using your arms (e.g., wheelchair or bedside chair)?: Total Help needed to walk in hospital room?: Total Help needed climbing 3-5 steps with a railing? : Total 6 Click Score: 6    End of Session Equipment Utilized During Treatment: Oxygen Activity Tolerance: Patient tolerated treatment well;No increased pain Patient left: with call bell/phone within reach;with family/visitor present;in bed Nurse Communication: Mobility status PT Visit Diagnosis: Unsteadiness on feet (R26.81);Muscle weakness (generalized) (M62.81);Difficulty in walking, not elsewhere classified (R26.2)    Time: 9030-0923 PT Time Calculation (min) (ACUTE ONLY): 49 min   Charges:   PT Evaluation $PT Re-evaluation: 1 Re-eval PT Treatments $Therapeutic Exercise: 8-22 mins $Therapeutic Activity: 8-22 mins       4:30 PM, 07/19/21 [name], PT, DPT Physical Therapist - [facility]  [phone] ([place])    [place] C 07/19/2021, 4:26  PM

## 2021-07-19 NOTE — Progress Notes (Addendum)
Nutrition Follow-up  DOCUMENTATION CODES:   Obesity unspecified  INTERVENTION:   -Continue MVI with minerals daily -Continue 500 mg vitamin C BID -D/c Nepro Shake po TID, each supplement provides 425 kcal and 19 grams protein  -Boost Breeze po TID, each supplement provides 250 kcal and 9 grams of protein  -Double protein portions with meals   NUTRITION DIAGNOSIS:   Inadequate oral intake related to inability to eat as evidenced by NPO status.  Progeressing; advanced to PO diet on 07/12/21  GOAL:   Patient will meet greater than or equal to 90% of their needs  Progressing   MONITOR:   Diet advancement, Labs, Weight trends, Skin, I & O's  REASON FOR ASSESSMENT:   Consult, Ventilator Enteral/tube feeding initiation and management  ASSESSMENT:   30 y.o. 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  11/19 admitted, intubated 11/21 extubated 11/22 s/p I&D R knee 11/23 s/p TEE 11/23- advanced to heart healthy diet 11/29- s/p Procedure(s): ARTHROSCOPY KNEE, IRRIGATION AND DEBRIDEMENT (Right)   Reviewed I/O's: -880 ml x 24 hours and -36.5 L since admission  UOP: 3.1 L x 24 hours   Pt remains with good appetite. Noted meal completions 50-100%. Pt is taking supplements. He remains on [facility]; will change to Nepro to help with blood sugar control. ADDEDUM: Per MD, pt does not like Nepro and prefers Boost Breeze. Will modify order and will also add double protein portions with meals. Pt  with increased nutritional needs due to wound healing and will require additional protein supplementation to help meet his nutritional goals.   Per orthopedics notes, plan to remove hemovac from rt knee tomorrow.   Medications reviewed and include vitamin C, colace, miralax, and prednisone.   Labs reviewed: CBGS: [id number] (inpatient orders for glycemic control are 0-20 units insulin aspart TID with meals and 20 units insulin glargine-yfgn daily).    Diet Order:   Diet Order             Diet heart healthy/carb modified Room service appropriate? Yes; Fluid consistency: Thin  Diet effective now                   EDUCATION NEEDS:   No education needs have been identified at this time  Skin:  Skin Assessment: Reviewed RN Assessment (Stage II coccyx, open skin wounds) Skin Integrity Issues:: Incisions Stage II: coccyx Incisions: closed rt knee  Last BM:  07/19/21 (250 via colostomy)  Height:   Ht Readings from Last 1 Encounters:  07/18/21 6\' 1"  (1.854 m)    Weight:   Wt Readings from Last 1 Encounters:  07/19/21 (!) 140.9 kg    Ideal Body Weight:  83.6 kg  BMI:  Body mass index is 40.98 kg/m.  Estimated Nutritional Needs:   Kcal:  2500-2700  Protein:  140-165 grams  Fluid:  > 2 L    Loistine Chance, RD, LDN, [place] Registered Dietitian II Certified Diabetes Care and Education Specialist Please refer to [facility] for RD and/or RD on-call/weekend/after hours pager

## 2021-07-19 NOTE — Evaluation (Signed)
Occupational Therapy Evaluation Patient Details Name: Craig [name] MRN: [id number] DOB: 1990-12-06 Today's Date: 07/19/2021   History of Present Illness Pt is a 30 y/o M admitted on 07/08/21 after presenting to the ED with c/o SOB & intermittent AMS. Pt was c/o R knee pain & perscribed pain medication on Monday. On arrival pt was found to be hypoxic with elevated HR & during exam was noted to have seizure like activity lasting <60 seconds. Pt is being treated for septic encephalopathy. Knee cultures revealed staph aureus & septic arthritis. PMH: inflammatory arthritis, prior severe covid19 infection requiring prolonged hospitalization & tracheostomy, chronic hypoxic & hypercarbic respiratory failure on 2L O2, MSSA bacteremia & PNA, perforated diverticulitis s/p partial colectomy with ostomy creation, morbid obesity, gout   Clinical Impression   Patient presenting with decreased Ind in self care, balance, functional mobility/transfers, endurance, and safety awareness. Patient reports needing assistance since June - July of 2022 for self care and functional transfers. Pt has been sleeping in lift chair and +2 assist to stand and transfer into wheelchair from lift chair. Pt reports he has been using shower but has also been reported that family wash him outside with water hose. Pt is heavy total A to EOB during evaluation. He does attempt to assist but performs less than 10% of task himself. Pt able to sit on EOB for 10 minutes with supervision and visible effort. R LE is very painful with bed mobility this session. His HR increases to 125 bpm seated on EOB. Pt appears very anxious and reports SOB with activity. O2 saturation at 90% on 4Ls seated EOB but pt asking his mother to turn O2 up to 5Ls once returning to bed. Total A of 2 for sit >supine and to reposition pt in hospital bed. He is unable to tolerate laying flat for even 1 minutes for therapist and mother to pull him up further in bed. Pt has  needed increased assistance at home prior to admission and OT recommends hoyer, sling, and hospital bed for discharge and to resume HHOT interventions.  Patient will benefit from acute OT to increase overall independence in the areas of ADLs, functional mobility, and safety awareness in order to safely discharge home with family.      Recommendations for follow up therapy are one component of a multi-disciplinary discharge planning process, led by the attending physician.  Recommendations may be updated based on patient status, additional functional criteria and insurance authorization.   Follow Up Recommendations  Home health OT    Assistance Recommended at Discharge Frequent or constant Supervision/Assistance  Functional Status Assessment  Patient has had a recent decline in their functional status and/or demonstrates limited ability to make significant improvements in function in a reasonable and predictable amount of time  Equipment Recommendations  Hospital bed , hoyer lift and sling      Precautions / Restrictions Precautions Precautions: Fall Restrictions Weight Bearing Restrictions: Yes RLE Weight Bearing: Weight bearing as tolerated      Mobility Bed Mobility Overal bed mobility: Needs Assistance Bed Mobility: Supine to Sit;Sit to Supine     Supine to sit: Total assist Sit to supine: Total assist        Transfers                   General transfer comment: deferred for safety      Balance Overall balance assessment: Needs assistance Sitting-balance support: Feet supported;Bilateral upper extremity supported Sitting balance-Leahy Scale: Fair Sitting balance -  Comments: supervision static sitting                                   ADL either performed or assessed with clinical judgement   ADL Overall ADL's : Needs assistance/impaired                                       General ADL Comments: pt is total A for bed  mobility. Seated static balance with close supervision - min guard and able to tolerate for ~ 10 minutes     Vision Patient Visual Report: No change from baseline              Pertinent Vitals/Pain Pain Assessment: Faces Faces Pain Scale: Hurts whole lot Pain Location: RLE Pain Descriptors / Indicators: Grimacing;Sore Pain Intervention(s): Limited activity within patient's tolerance;Monitored during session;Premedicated before session     Hand Dominance Right   Extremity/Trunk Assessment Upper Extremity Assessment Upper Extremity Assessment: Generalized weakness   Lower Extremity Assessment Lower Extremity Assessment: Generalized weakness          Cognition Arousal/Alertness: Awake/alert Behavior During Therapy: WFL for tasks assessed/performed Overall Cognitive Status: Within Functional Limits for tasks assessed                                 General Comments: pleasant and cooperative                Home Living Family/patient expects to be discharged to:: Private residence (been living at parent's house since discharge from covid in Feb) Living Arrangements: Spouse/significant other;Children;Parent (son and daughter ages 41 and 32) Available Help at Discharge: Family;Available 24 hours/day Type of Home: House Home Access: Ramped entrance     Home Layout: Multi-level;Able to live on main level with bedroom/bathroom     Bathroom Shower/Tub: Walk-in shower         Home Equipment: Hospital bed;Wheelchair - Water quality scientist (4 wheels)   Additional Comments: hospital bed comng on 12/2; grab bars on wall, lift chair (got rid of hoyer lift requesting another one; leg rests does not have elevated' leg rests (WC borrowed, needs a size approprate one)      Prior Functioning/Environment               Mobility Comments: Pt was working with HHPT from Feb-June/July 2022 & was able to ambulate with RW until R knee pain began. Since then pt has been  primarily sleeping/staying in his lift chair (cannot tolerate lying in hospital bed). Wife reports he is able to use grab bars, lift chair +2 family assistance to transfer lift chair>w/c & they bathe him outside with the water hose.          OT Problem List: Decreased strength;Decreased activity tolerance;Impaired balance (sitting and/or standing);Decreased safety awareness;Decreased range of motion      OT Treatment/Interventions: Self-care/ADL training;Therapeutic exercise;Therapeutic activities;Energy conservation;Patient/family education;DME and/or AE instruction;Balance training    OT Goals(Current goals can be found in the care plan section) Acute Rehab OT Goals Patient Stated Goal: to get better OT Goal Formulation: With patient/family Time For Goal Achievement: 08/02/21 Potential to Achieve Goals: Fair ADL Goals Pt Will Perform Grooming: with set-up;sitting Pt Will Transfer to Toilet: with max assist;bedside commode Pt Will Perform Toileting - Clothing Manipulation  and hygiene: with max assist (of 1 person) Pt/caregiver will Perform Home Exercise Program: With written HEP provided;Both right and left upper extremity;Increased strength;Increased ROM  OT Frequency: Min 2X/week   Barriers to D/C:    none known at this time          AM-PAC OT "6 Clicks" Daily Activity     Outcome Measure Help from another person eating meals?: A Little Help from another person taking care of personal grooming?: A Little Help from another person toileting, which includes using toliet, bedpan, or urinal?: Total Help from another person bathing (including washing, rinsing, drying)?: Total Help from another person to put on and taking off regular upper body clothing?: A Lot Help from another person to put on and taking off regular lower body clothing?: Total 6 Click Score: 11   End of Session Nurse Communication: Mobility status  Activity Tolerance: Patient tolerated treatment well Patient  left: in bed;with call bell/phone within reach;with bed alarm set;with family/visitor present  OT Visit Diagnosis: Unsteadiness on feet (R26.81);Repeated falls (R29.6);Muscle weakness (generalized) (M62.81)                Time: 1607-3710 OT Time Calculation (min): 32 min Charges:  OT General Charges $OT Visit: 1 Visit OT Evaluation $OT Eval Moderate Complexity: 1 Mod OT Treatments $Therapeutic Activity: 23-37 mins  Darleen Crocker, MS, OTR/L , CBIS ascom [phone]  07/19/21, 3:19 PM

## 2021-07-20 ENCOUNTER — Inpatient Hospital Stay: Payer: Medicaid Other

## 2021-07-20 DIAGNOSIS — J9621 Acute and chronic respiratory failure with hypoxia: Secondary | ICD-10-CM | POA: Diagnosis not present

## 2021-07-20 LAB — CBC
HCT: 26.5 % — ABNORMAL LOW (ref 39.0–52.0)
Hemoglobin: 7.6 g/dL — ABNORMAL LOW (ref 13.0–17.0)
MCH: 27.3 pg (ref 26.0–34.0)
MCHC: 28.7 g/dL — ABNORMAL LOW (ref 30.0–36.0)
MCV: 95.3 fL (ref 80.0–100.0)
Platelets: 255 K/uL (ref 150–400)
RBC: 2.78 MIL/uL — ABNORMAL LOW (ref 4.22–5.81)
RDW: 19.1 % — ABNORMAL HIGH (ref 11.5–15.5)
WBC: 7.6 K/uL (ref 4.0–10.5)
nRBC: 0.3 % — ABNORMAL HIGH (ref 0.0–0.2)

## 2021-07-20 LAB — POTASSIUM: Potassium: 3.3 mmol/L — ABNORMAL LOW (ref 3.5–5.1)

## 2021-07-20 LAB — GLUCOSE, CAPILLARY
Glucose-Capillary: 271 mg/dL — ABNORMAL HIGH (ref 70–99)
Glucose-Capillary: 158 mg/dL — ABNORMAL HIGH (ref 70–99)
Glucose-Capillary: 199 mg/dL — ABNORMAL HIGH (ref 70–99)
Glucose-Capillary: 277 mg/dL — ABNORMAL HIGH (ref 70–99)

## 2021-07-20 LAB — BASIC METABOLIC PANEL WITH GFR
Anion gap: 7 (ref 5–15)
BUN: 7 mg/dL (ref 6–20)
CO2: 37 mmol/L — ABNORMAL HIGH (ref 22–32)
Calcium: 8.5 mg/dL — ABNORMAL LOW (ref 8.9–10.3)
Chloride: 94 mmol/L — ABNORMAL LOW (ref 98–111)
Creatinine, Ser: 0.49 mg/dL — ABNORMAL LOW (ref 0.61–1.24)
GFR, Estimated: >60 mL/min
Glucose, Bld: 189 mg/dL — ABNORMAL HIGH (ref 70–99)
Potassium: 2.6 mmol/L — CL (ref 3.5–5.1)
Sodium: 138 mmol/L (ref 135–145)

## 2021-07-20 LAB — MAGNESIUM: Magnesium: 1.7 mg/dL (ref 1.7–2.4)

## 2021-07-20 IMAGING — US US EXTREM UP*L* LTD
1 series · 6 of 6 positions shown · non-contrast
Comparison: None.

CLINICAL DATA: 30-year-old male with possible left shoulder septic
arthritis.

EXAM:
ULTRASOUND LEFT UPPER EXTREMITY LIMITED
TECHNIQUE: Ultrasound examination of the upper extremity soft tissues was
performed in the area of clinical concern.

[Series 1: us soft tissue upper extremity limited left (non-v · 6 of 6 slices shown]
[im 1/6]
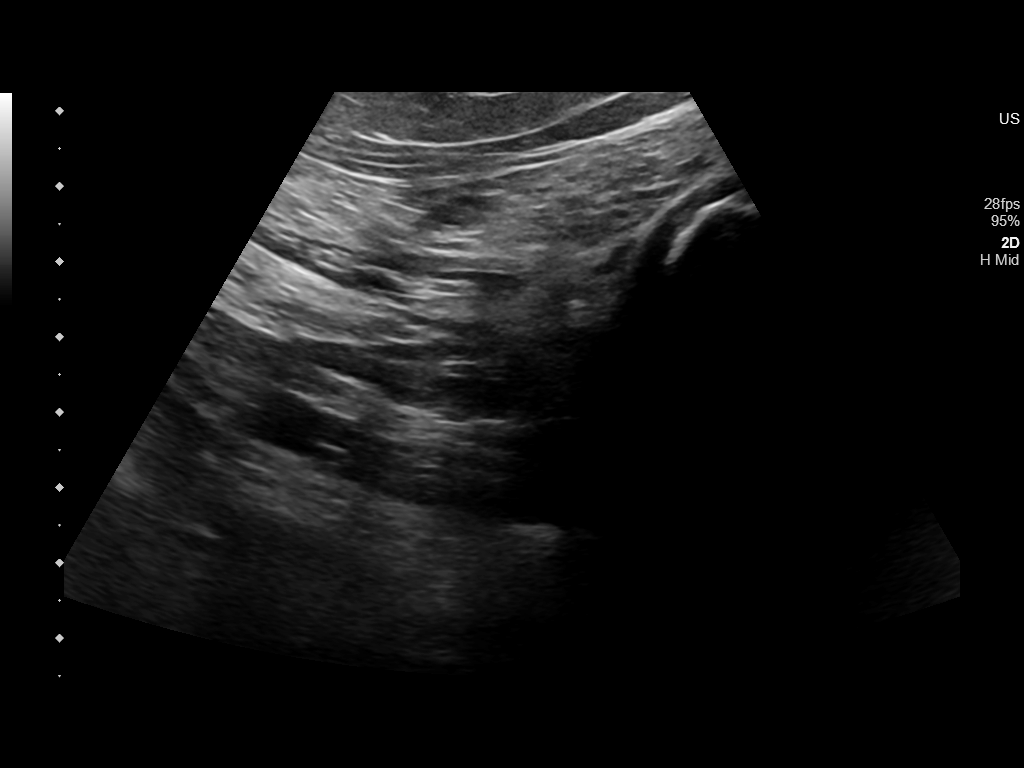
[im 2/6]
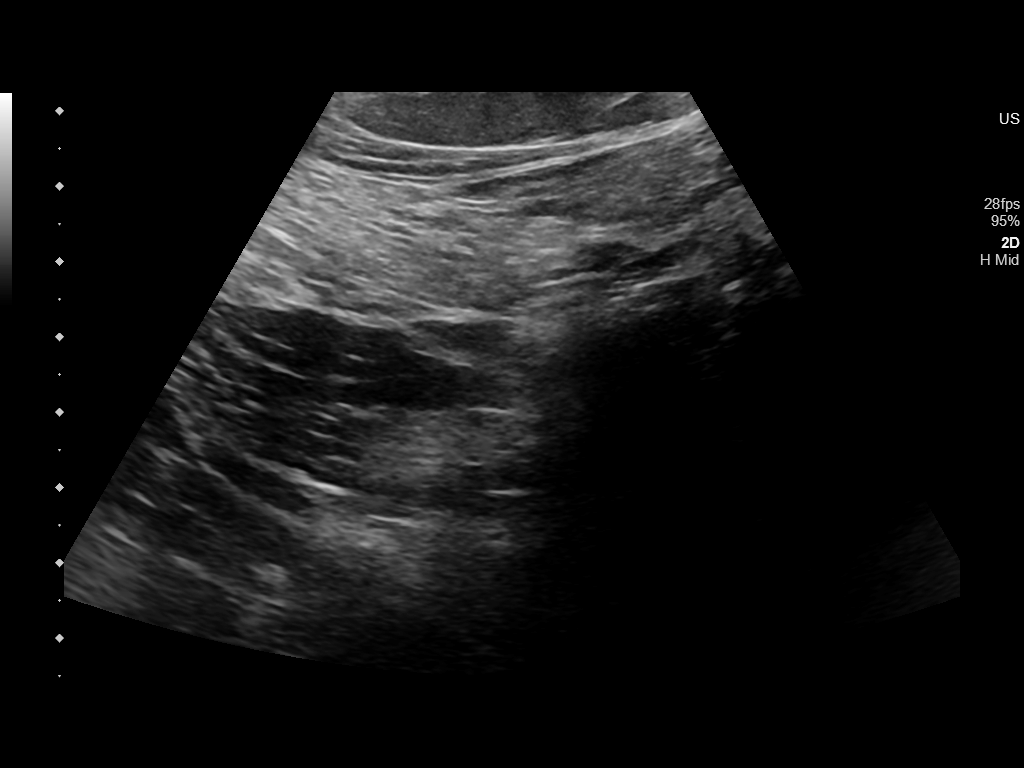
[im 3/6]
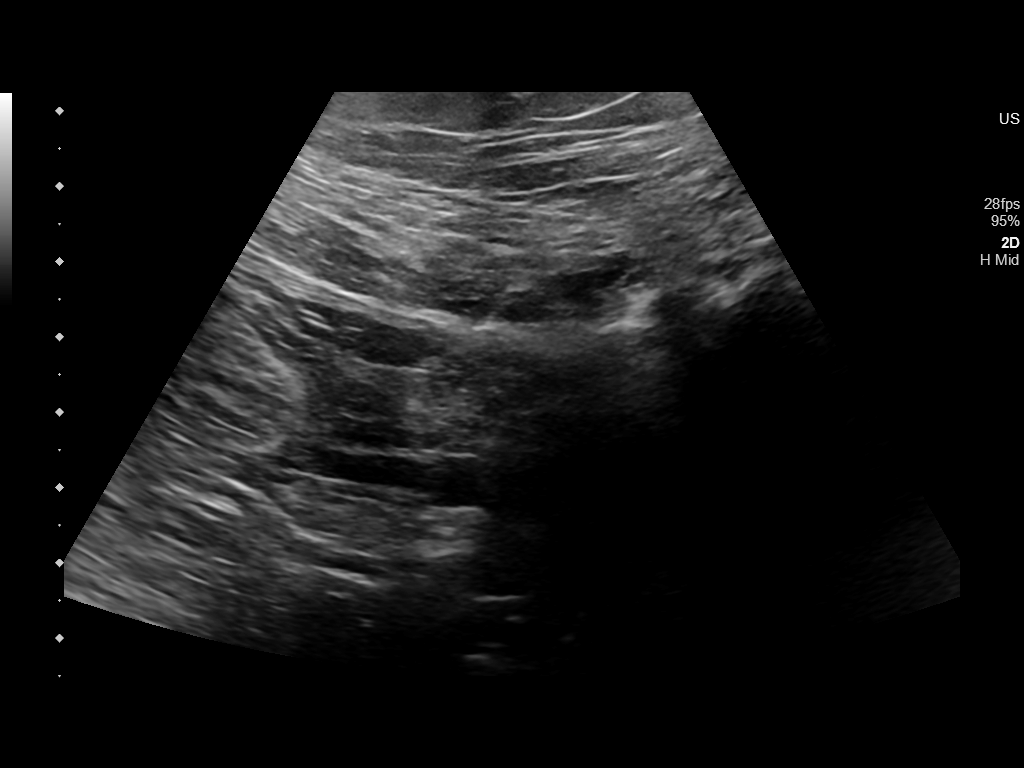
[im 4/6]
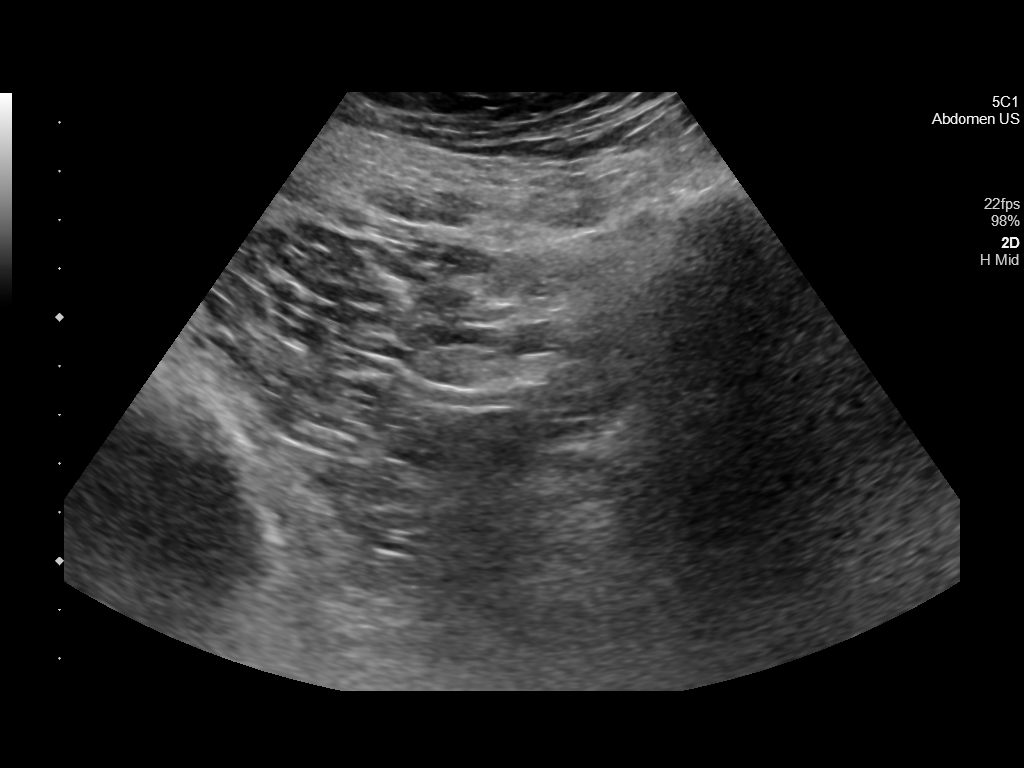
[im 5/6]
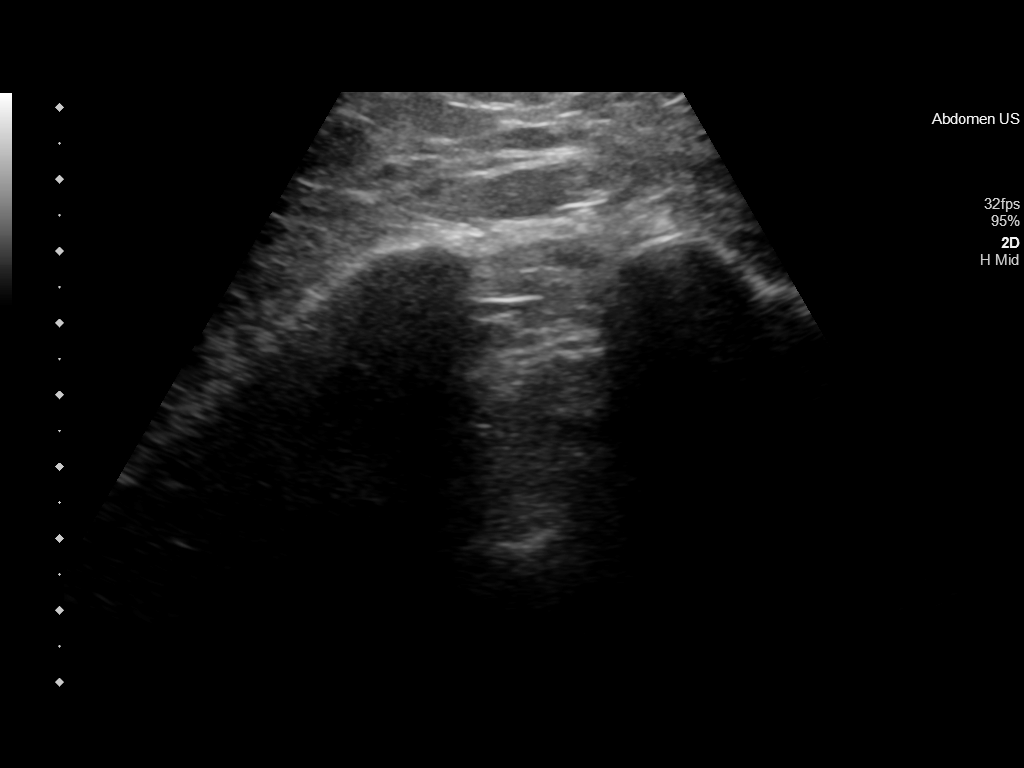
[im 6/6]
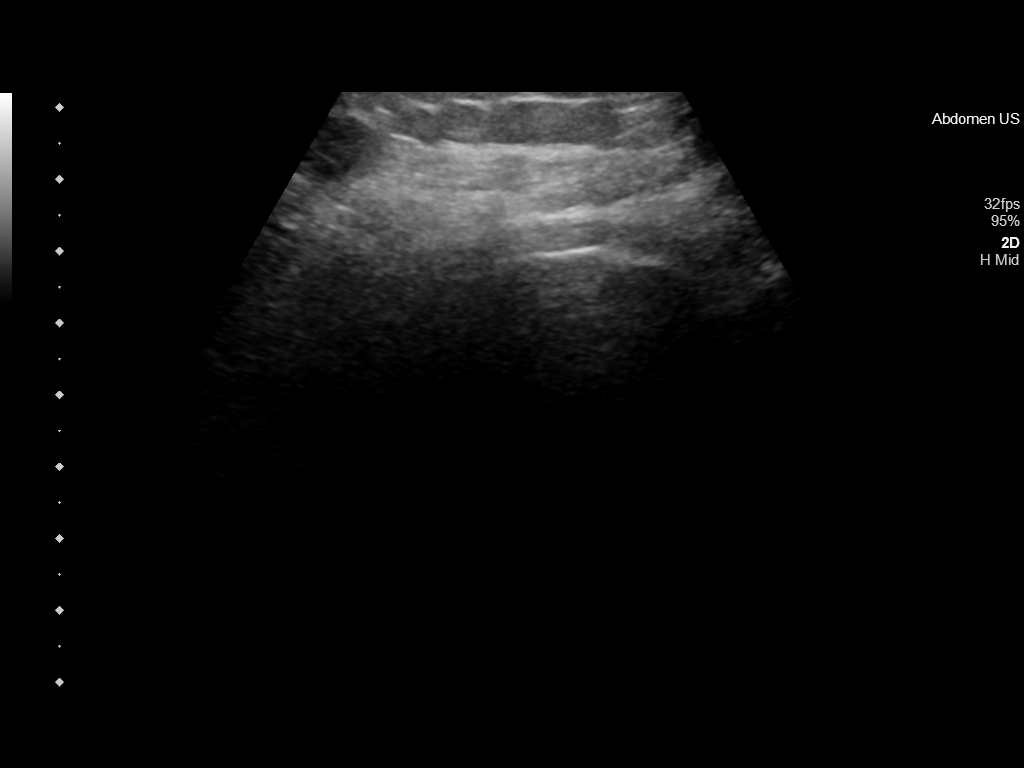

[6 of 6 positions shown; findings below may reference images not displayed]

FINDINGS: Ultrasound evaluation of the left shoulder glenohumeral joint
demonstrates no evidence of significant joint effusion.
IMPRESSION: No sonographic evidence of left shoulder effusion.

## 2021-07-20 MED ORDER — BOOST / RESOURCE BREEZE PO LIQD CUSTOM
1.0000 | Freq: Three times a day (TID) | ORAL | Status: DC
  Administered 2021-07-20 – 2021-07-24 (×14): 1 via ORAL

## 2021-07-20 MED ORDER — FUROSEMIDE 10 MG/ML IJ SOLN
60.0000 mg | Freq: Two times a day (BID) | INTRAMUSCULAR | Status: DC
  Administered 2021-07-21 – 2021-07-23 (×6): 60 mg via INTRAVENOUS
  Filled 2021-07-20 (×7): qty 8

## 2021-07-20 MED ORDER — POTASSIUM CHLORIDE 10 MEQ/100ML IV SOLN
10.0000 meq | INTRAVENOUS | Status: AC
  Administered 2021-07-20 (×4): 10 meq via INTRAVENOUS
  Filled 2021-07-20 (×4): qty 100

## 2021-07-20 MED ORDER — POTASSIUM CHLORIDE CRYS ER 20 MEQ PO TBCR
40.0000 meq | EXTENDED_RELEASE_TABLET | Freq: Once | ORAL | Status: AC
  Administered 2021-07-20: 40 meq via ORAL
  Filled 2021-07-20: qty 2

## 2021-07-20 MED ORDER — MAGNESIUM SULFATE IN D5W 1-5 GM/100ML-% IV SOLN
1.0000 g | Freq: Once | INTRAVENOUS | Status: AC
  Administered 2021-07-20: 1 g via INTRAVENOUS
  Filled 2021-07-20: qty 100

## 2021-07-20 NOTE — Progress Notes (Signed)
OT Cancellation Note  Patient Details Name: Craig [name] MRN: [id number] DOB: 1990/12/27   Cancelled Treatment:    Reason Eval/Treat Not Completed: Patient at procedure or test/ unavailable. Pt out for ultrasounds. Exercise handout left for pt in room. Will re-attempt at later date/time as pt is available.   Ardeth Perfect., MPH, MS, OTR/L ascom [phone] 07/20/21, 1:59 PM

## 2021-07-20 NOTE — Progress Notes (Signed)
OT Cancellation Note  Patient Details Name: Craig [name] MRN: [id number] DOB: 16-Mar-1991   Cancelled Treatment:    Reason Eval/Treat Not Completed: Medical issues which prohibited therapy (Pt. presents with low K+ at 2.6 which is contraindicated for OT intervention at this time. Will continue to monitor, and intervene as appropriate.)  Harrel Carina, MS, OTR/L 07/20/2021, 1:14 PM

## 2021-07-20 NOTE — Progress Notes (Signed)
Date of Admission:  07/08/2021     ID: Craig [name] is a 30 y.o. male  Principal Problem:   Acute on chronic respiratory failure with hypoxemia (HCC) Active Problems:   Colostomy status ([place])   Pressure injury of skin  His dad and son are at the bedside  Subjective: Patient doing better Right knee pain better Left shoulder is tight but pain is less Underwent an ultrasound of the left shoulder and there is no effusion Medications:   acetaZOLAMIDE  250 mg Oral BID   allopurinol  300 mg Oral Daily   vitamin C  500 mg Oral BID   chlorhexidine gluconate (MEDLINE KIT)  15 mL Mouth Rinse BID   Chlorhexidine Gluconate Cloth  6 each Topical Q0600   COVID-19 mRNA bivalent vaccine (Moderna)  0.5 mL Intramuscular Once   DULoxetine  60 mg Oral Daily   enoxaparin (LOVENOX) injection  0.5 mg/kg Subcutaneous Q24H   famotidine  20 mg Oral BID   feeding supplement  1 Container Oral TID BM   folic acid  1 mg Oral Daily   [START ON 07/21/2021] furosemide  60 mg Intravenous BID   insulin aspart  0-20 Units Subcutaneous TID WC   insulin aspart  10 Units Subcutaneous TID WC   insulin glargine-yfgn  24 Units Subcutaneous BID   lidocaine  1 patch Transdermal Q24H   metoprolol succinate  100 mg Oral Daily   multivitamin with minerals  1 tablet Oral Daily   nicotine  21 mg Transdermal Daily   nystatin   Topical TID   predniSONE  5 mg Oral Q breakfast   pregabalin  200 mg Oral BID   sodium chloride flush  10-40 mL Intracatheter Q12H   zinc oxide   Topical Daily    Objective: Vital signs in last 24 hours: Temp:  [97.7 F (36.5 C)-99.7 F (37.6 C)] 99.7 F (37.6 C) (12/01 2033) Pulse Rate:  [110-117] 110 (12/01 2033) Resp:  [18-20] 20 (12/01 2033) BP: (102-154)/(53-74) 126/59 (12/01 2033) SpO2:  [98 %-100 %] 98 % (12/01 2033) Weight:  [143.4 kg] 143.4 kg (12/01 0651)  PHYSICAL EXAM:  General: awake and alert, some shortness of breath.  On nasal cannula oxygen.  Lungs: b/l air  entry- decreased air entry bases Heart: s1s2 Abdomen: Soft,colostomy  Extremities: rt knee- surgical dressing Hemovact drain has been removed. Edematous extremities B/l feet - dorsum - scabbed wound  Neurologic: grossly non focal  Left shoulder no warmth.  Passive abduction possible  foley has been removed Right midline catheter Lab Results Recent Labs    07/19/21 0301 07/20/21 0403 07/20/21 1552  WBC 9.9 7.6  --   HGB 9.9* 7.6*  --   HCT 33.0* 26.5*  --   NA 137 138  --   K 4.3 2.6* 3.3*  CL 94* 94*  --   CO2 35* 37*  --   BUN 8 7  --   CREATININE 0.50* 0.49*  --    Liver Panel No results for input(s): PROT, ALBUMIN, AST, ALT, ALKPHOS, BILITOT, BILIDIR, IBILI in the last 72 hours.  Sedimentation Rate No results for input(s): ESRSEDRATE in the last 72 hours. C-Reactive Protein No results for input(s): CRP in the last 72 hours.  Microbiology: 07/08/21 BC- MSSA 07/09/21- BC NG 07/11/21- rt knee synovial culture- staph aureus     Assessment/Plan: MSSa bacteremia MSSA rt knee septic arthritis- s/p I/D TEE neg Repeat blood culture neg Pt currently on nafcillin Had repeat arthroscopic  cwash out today- has a vac drain Will need 6 weeks of Iv antibiotic-we will change nafcillin to cefazolin tomorrow..  Left shoulder pain but no fluid seen on ultrasound.   Acute hypoxic resp failure- resolved s/p extubation OSA - not formally diagnosed- CPAP  H/O COVID with long hospitalization in Dec- Fb 2022 , needing tracheostomy and PEG- both have been removed  Polyneuropathy- critical illness related   Complicated diverticulitis , perforation, s/p colectomy and colostomy Discussed the management with patient and his dad

## 2021-07-20 NOTE — Progress Notes (Signed)
PROGRESS NOTE  KIEV LABROSSE BMW:[id number] DOB: [birth year] DOA: 07/08/2021 PCP: [name], MD  Brief History   Craig [name] is a 30 y.o. male with significant PMH of extensive medical history including inflammatory arthritis on methotrexate and chronic prednisone at 10 mg daily, prior severe Covid-19 infection requiring prolonged hospitalization and tracheostomy (since decannulated) Dec 2021 - Feb 2022, chronic hypoxic and hypercarbic respiratory failure on 2L O2 PRN, MSSA bacteremia, MSSA pneumonia, perforated diverticulitis s/p partial colectomy with ostomy creation, morbid obesity and gout who presented to the ED with chief complaints of progressive shortness of breath. Patient is currently intubated, history mostly obtained from patient's chart and patient's wife who is currently at the bedside.  Per patient's wife, EMS was called today due to worsening shortness of breath and intermittent altered mental status.  [name] stated the patient has been complaining of right knee pain was prescribed Percocet on Monday.  She took the Percocet at Monday and noticed worsening shortness of breath with mental status change per wife.  He also takes tramadol for pain.  No reports of chest pain, nausea vomiting, abdominal pain, diaphoresis, fevers or chills, cough or any other focal neurological symptoms or deficit.  On EMS arrival patient was noted to be hypoxic with a heart rate between 140 to 150 bpm.  He was placed on 6 L to maintain SPO2 greater than 90% and transported to the ED. In the ED, pt was noted to have seizure-like activity.  Pt was intubated, and PCCM consulted for admission and further management.   Pt was transferred to hospitalist service on 07/14/21.  The patient continues to require 4 L of O2 by [place]. He is on 2L O2 at home. 2/2 blood cultures from 07/08/2021 have grown out MSSA. There is concern for sepsis in the left shoulder. An attempt was made for an MRI to  investigate the shoulder, however, the patient was unable to tolerate MRI due to dyspnea with lying down flat. The patient continues to be volume overloaded. Will continue to diurese and attempt MRI again in a couple of days when his respiratory status is improved and he has some ativan prior to the study.  Consultants  Infectious disease Orthopedic surgery  Procedures  None  Antibiotics   Anti-infectives (From admission, onward)    Start     Dose/Rate Route Frequency Ordered Stop   07/10/21 1630  nafcillin 12 g in sodium chloride 0.9 % 500 mL continuous infusion        12 g 20.8 mL/hr over 24 Hours Intravenous Every 24 hours 07/10/21 1511     07/10/21 1600  nafcillin 2 g in sodium chloride 0.9 % 100 mL IVPB        2 g 200 mL/hr over 30 Minutes Intravenous  Once 07/10/21 1511 07/10/21 1632   07/09/21 1000  ceFEPIme (MAXIPIME) 2 g in sodium chloride 0.9 % 100 mL IVPB  Status:  Discontinued        2 g 200 mL/hr over 30 Minutes Intravenous Every 8 hours 07/09/21 0823 07/10/21 1029   07/09/21 0600  ceFAZolin (ANCEF) IVPB 2g/100 mL premix  Status:  Discontinued        2 g 200 mL/hr over 30 Minutes Intravenous Every 8 hours 07/09/21 0311 07/10/21 1511   07/08/21 2000  vancomycin (VANCOREADY) IVPB 1500 mg/300 mL  Status:  Discontinued        1,500 mg 150 mL/hr over 120 Minutes Intravenous Every 8 hours 07/08/21 1900 07/09/21  7121   07/08/21 1830  ceFEPIme (MAXIPIME) 2 g in sodium chloride 0.9 % 100 mL IVPB  Status:  Discontinued        2 g 200 mL/hr over 30 Minutes Intravenous Every 8 hours 07/08/21 1726 07/09/21 0311   07/08/21 0945  vancomycin (VANCOCIN) IVPB 1000 mg/200 mL premix        1,000 mg 200 mL/hr over 60 Minutes Intravenous  Once 07/08/21 0935 07/08/21 1203   07/08/21 0945  ceFEPIme (MAXIPIME) 2 g in sodium chloride 0.9 % 100 mL IVPB        2 g 200 mL/hr over 30 Minutes Intravenous  Once 07/08/21 0935 07/08/21 1146   07/08/21 0945  azithromycin (ZITHROMAX) 500 mg in sodium  chloride 0.9 % 250 mL IVPB        500 mg 250 mL/hr over 60 Minutes Intravenous  Once 07/08/21 0935 07/08/21 1420        Subjective  The patient is sitting up at bedside. His wife is visiting. He appears dyspneic at rest and anxious.  Objective   Vitals:  Vitals:   07/20/21 1517 07/20/21 1602  BP: (!) 102/53 135/68  Pulse: (!) 113 (!) 110  Resp: 20 20  Temp: 97.8 F (36.6 C) 98.3 F (36.8 C)  SpO2: 98% 98%   Exam:  Constitutional:  The patient is awake, alert, and oriented x 3. He is in mild distress from dyspnea. Respiratory:  Mild conversational dyspnea Lung sounds are distant No wheezes or rhonchi Scattered rales No tactile fremitus Cardiovascular:  Regular rate and rhythm No murmurs, ectopy, or gallups. No lateral PMI. No thrills. Abdomen:  Abdomen is soft, non-tender, non-distended Morbidly obese No hernias, masses, or organomegaly Normoactive bowel sounds.  Musculoskeletal:  No cyanosis, clubbing 2-3 pitting edema of lower extremities bilaterally Skin:  No rashes, lesions, ulcers palpation of skin: no induration or nodules Neurologic:  CN 2-12 intact Sensation all 4 extremities intact Psychiatric:  Mental status Mood, affect appropriate Orientation to person, place, time  judgment and insight appear intact  I have personally reviewed the following:   Today's Data  Vitals  Lab Data  CBC, BMP  Micro Data  2/2 blood cultures from 07/08/2021 have grown out MSSA.   Imaging    Cardiology Data  EKG  Scheduled Meds:  acetaZOLAMIDE  250 mg Oral BID   allopurinol  300 mg Oral Daily   vitamin C  500 mg Oral BID   chlorhexidine gluconate (MEDLINE KIT)  15 mL Mouth Rinse BID   Chlorhexidine Gluconate Cloth  6 each Topical Q0600   COVID-19 mRNA bivalent vaccine (Moderna)  0.5 mL Intramuscular Once   DULoxetine  60 mg Oral Daily   enoxaparin (LOVENOX) injection  0.5 mg/kg Subcutaneous Q24H   famotidine  20 mg Oral BID   feeding supplement  1  Container Oral TID BM   folic acid  1 mg Oral Daily   furosemide  40 mg Intravenous BID   insulin aspart  0-20 Units Subcutaneous TID WC   insulin aspart  10 Units Subcutaneous TID WC   insulin glargine-yfgn  24 Units Subcutaneous BID   lidocaine  1 patch Transdermal Q24H   metoprolol succinate  100 mg Oral Daily   multivitamin with minerals  1 tablet Oral Daily   nicotine  21 mg Transdermal Daily   nystatin   Topical TID   predniSONE  5 mg Oral Q breakfast   pregabalin  200 mg Oral BID   sodium chloride flush  10-40 mL Intracatheter Q12H   zinc oxide   Topical Daily   Continuous Infusions:  sodium chloride Stopped (07/20/21 0640)   nafcillin (NAFCIL) continuous infusion 12 g (07/20/21 1753)    Principal Problem:   Acute on chronic respiratory failure with hypoxemia (HCC) Active Problems:   Colostomy status (HCC)   Pressure injury of skin   LOS: 12 days   A & P  Acute on chronic hypoxic and hypercarbic respiratory failure On chronic 2L home O2 - Extubated 11/23.  Currently on 3L [place]. --Continue supplemental O2 to keep sats >=90%, wean as tolerated   Sepsis 2/2  MSSA bacteremia  Right septic joint s/p I/D on 07/11/21 and again on 07/18/21 -Infectious Disease consulted -s/p TEE - no veg -Repeat blood cultures neg. --purulent drainage noted from the knee wound so pt was taken to OR for 2nd I/D. Plan: --cont nafcillin, Will need 6 weeks of Iv antibiotic.  Before discharge will change to cefazolin, per ID. --pain control for right knee --cont Hemovac, to be managed by ortho   Left shoulder pain --Xray of left shoulder negative --MRI of left shoulder to rule out septic joint   Shock -RESOLVED  - Favor sedation/analgesia related hypotension rather than septic. --s/p pressors --cont home prednisone (for rheumatoid arthritis); no indication for stress dose steroids at this time   Acute toxic metabolic encephalopathy, resolved --2/2 sepsis -CT Head wnl   Seizure Like  Activity- RESOLVED -CT Head wnl -Loaded with Keppra 1500 mg x 1 -Stopped Keppra; monitor for recurrence of seizure activity, per neuro   H/o upper extremity DVT It appears that he was started on Xarelto before around Feb/Mar 2022 for provoked upper extremity DVT. He has completed an appropriate course of anticoagulation at this time and therapeutic anticoagulation will be discontinued. CTA chest did not reveal PE on admission.   DM2, well controlled --A1c 6.8 --increase glargine to 24u BID --cont mealtime 10u TID --SSI TID   Probable OSA --look into getting pt qualified for CPAP --CPAP nightly while inpatient   Anasarca --averages about 4L urine output per 1 dose of IV lasix 40 Plan: --cont IV lasix 40 mg BID --strict I/O --d/c Foley today   Contraction alkalosis, improved --improved after starting diamox --cont diamox for now   Hypoalbuminemia  --albumin 2.6 --pt refused nepro, supplement changed to clear boost --cont clear boost TID (however, dieticians keep changing it back to Nepro).    Complicated diverticulitis , perforation, s/p colectomy and colostomy --monitor output --change scheduled Miralax and Colace to PRN due to watery output    I have seen and examined this patient myself. I have spent 35 minutes in his evaluation and care.   DVT prophylaxis: Lovenox SQ Code Status: Full code  Family Communication: mother updated at bedside today   Level of care: Telemetry Surgical Dispo:   The patient is from: home Anticipated d/c is to: home with [facility] and DME, pt and wife refused SNF  Heather Streeper, DO [facility] Direct contact: see [email or web address]  7PM-7AM contact night coverage as above 07/20/2021, 6:02 PM  LOS: 12 days

## 2021-07-20 NOTE — Progress Notes (Signed)
Physical Therapy Treatment Patient Details Name: Craig [name] MRN: [id number] DOB: 05/19/1991 Today's Date: 07/20/2021   History of Present Illness Craig [name] is a 30 y/o M admitted on 07/08/21 after presenting to the ED with c/o SOB & intermittent AMS. Pt was c/o R knee pain & perscribed pain medication on Monday. On arrival pt was found to be hypoxic with elevated HR & during exam was noted to have seizure like activity lasting <60 seconds. Pt is being treated for septic encephalopathy. Knee cultures revealed staph aureus & septic arthritis. 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 OP Neurology did EMG study June 2022 for paresthesias bilat: "This is an abnormal electrodiagnostic study consistent with a 1) generalized severe sensorimotor polyneuropathy. 2) Superimposed left radial neuropathy 3)Can't rule out C5 radiculopathy. "    PT Comments    Pt in bed upon arrival, agreeable to session. Pain well controlled at rest, just had meds prior to entry. Flow rate down to 4L this date, 95% at rest. Continued with ROM and motor activation training, as well as postural education. Wife assisted with pulling pt up in bed, able to chair him out with [facility] at 55 degrees to set up for lunch at exit. Generally improved strength since previous day, with exception to Rt quads which he struggles with activation. Rt knee flexion ROM better tolerated this date, most pain with attempting extension, suspect bed position and tightness of hamstrings creating high levels of joint compression with extension which makes pain more intense. Will reach out to MD regarding foot pumps for pedal edema issues. Family brought in prevalon boots from home to help with  "foot drop" contractures.   Recommendations for follow up therapy are one component of a multi-disciplinary discharge planning process, led by the attending physician.  Recommendations may be updated based on patient status, additional functional criteria and insurance authorization.  Follow Up Recommendations  Home health PT     Assistance Recommended at Discharge Frequent or constant Supervision/Assistance  Equipment Recommendations  None recommended by PT    Recommendations for Other Services       Precautions / Restrictions Precautions Precautions: Fall Restrictions RLE Weight Bearing: Weight bearing as tolerated     Mobility  Bed Mobility Overal bed mobility:  (+2 to pull up in bed, trended bed)                  Transfers                        Ambulation/Gait                   Stairs             Wheelchair Mobility    Modified Rankin (Stroke Patients Only)       Balance                                            Cognition Arousal/Alertness: Awake/alert Behavior During Therapy: WFL for tasks assessed/performed Overall Cognitive Status: Within Functional Limits for tasks assessed  Exercises Total Joint Exercises Short Arc Quad: PROM;Right;20 reps;Limitations Short Arc Quad Limitations: 2 sets of 10, unable to provide much help actively opted to perform in P/ROM General Exercises - Lower Extremity Ankle Circles/Pumps: Strengthening;Both;Supine;10 reps;AAROM Ankle Circles/Pumps Limitations: followed by 2x30sec ankle DF stretch Short Arc Quad: AAROM;Supine;Left;15 reps;AROM Short Arc [employer] Limitations: 3 sets of 15 (TKE limited due to HS/capsular tightness Other Exercises Other Exercises: isometric BUE chinups on ceiling lift at forehead level height 1x10x3secH (author helps maintain Left hand on bar due to grip strength weakness) Other Exercises:  LUE elbow/shoulder flexion/extension P/ROM x20; RUE blue theraband row x20; RUE biceps curls with 0.5lb weight 1x12; RUE triceps rope extension 1x15 c blue theraband; RUE overhead reach AA/ROM 1x15 in short sitting (~135 degrees) Other Exercises: repositioning in bed for short sitting for meal, HOB at 55 degrees, RLE donned in prevalon boot from home to off load heel. Other Exercises: RUE GHJ external rotation unloaded 1x12 AA/ROM (profoundly weak, but without triceps compensation)    General Comments        Pertinent Vitals/Pain Pain Score: 0-No pain (well controlled at rest)    Home Living                          Prior Function            PT Goals (current goals can now be found in the care plan section) Acute Rehab PT Goals Patient Stated Goal: get better PT Goal Formulation: With patient/family Time For Goal Achievement: 07/28/21 Potential to Achieve Goals: Fair Progress towards PT goals: Progressing toward goals    Frequency    Min 2X/week      PT Plan Current plan remains appropriate    Co-evaluation              AM-PAC PT "6 Clicks" Mobility   Outcome Measure  Help needed turning from your back to your side while in a flat bed without using bedrails?: Total Help needed moving from lying on your back to sitting on the side of a flat bed without using bedrails?: Total Help needed moving to and from a bed to a chair (including a wheelchair)?: Total Help needed standing up from a chair using your arms (e.g., wheelchair or bedside chair)?: Total Help needed to walk in hospital room?: Total Help needed climbing 3-5 steps with a railing? : Total 6 Click Score: 6    End of Session Equipment Utilized During Treatment: Oxygen Activity Tolerance: Patient tolerated treatment well;No increased pain Patient left: with call bell/phone within reach;with family/visitor present;in bed Nurse Communication: Mobility status PT Visit Diagnosis: Unsteadiness on  feet (R26.81);Muscle weakness (generalized) (M62.81);Difficulty in walking, not elsewhere classified (R26.2) Pain - Right/Left: Right Pain - part of body: Knee     Time: 1135-1220 PT Time Calculation (min) (ACUTE ONLY): 45 min  Charges:  $Therapeutic Exercise: 38-52 mins               1:27 PM, 07/20/21 [name], PT, DPT Physical Therapist - [facility]  [phone] ([place])    Kirstein Baxley C 07/20/2021, 1:23 PM

## 2021-07-20 NOTE — Progress Notes (Signed)
   Subjective: 2 Days Post-Op Procedure(s) (LRB): ARTHROSCOPY KNEE, IRRIGATION AND DEBRIDEMENT (Right) Patient reports right knee pain is mild.  No complaints.  Hemovac intact.  Left shoulder pain stable.  Unable to tolerate MRI yesterday due to breathing issues.   Objective: Vital signs in last 24 hours: Temp:  [97.7 F (36.5 C)-99.3 F (37.4 C)] 98.1 F (36.7 C) (12/01 0756) Pulse Rate:  [102-117] 110 (12/01 0756) Resp:  [16-20] 20 (12/01 0756) BP: (111-154)/(49-74) 135/69 (12/01 0756) SpO2:  [98 %-99 %] 99 % (12/01 0756) Weight:  [143.4 kg] 143.4 kg (12/01 0651)  Intake/Output from previous day: 11/30 0701 - 12/01 0700 In: 1325.7 [P.O.:350; I.V.:1.5; IV Piggyback:974.2] Out: 9604 [Urine:5450; Drains:10; Stool:275] Intake/Output this shift: No intake/output data recorded.  Recent Labs    07/18/21 0419 07/18/21 0843 07/19/21 0301 07/20/21 0403  HGB 10.0* 10.9* 9.9* 7.6*   Recent Labs    07/19/21 0301 07/20/21 0403  WBC 9.9 7.6  RBC 3.46* 2.78*  HCT 33.0* 26.5*  PLT 323 255   Recent Labs    07/19/21 0301 07/20/21 0403  NA 137 138  K 4.3 2.6*  CL 94* 94*  CO2 35* 37*  BUN 8 7  CREATININE 0.50* 0.49*  GLUCOSE 282* 189*  CALCIUM 8.4* 8.5*   No results for input(s): LABPT, INR in the last 72 hours.  EXAM General - Patient is Alert, Appropriate, and Oriented Right lower extremity - Neurovascular intact Sensation intact distally Intact pulses distally Dorsiflexion/Plantar flexion intact Hemovac intact, Hemovac removed Ace wrap in tact, no drainage Left upper extremity: Active abduction to 90 degrees, passively I can increase him to 120 without any significant pain.  He has minimal pain with external rotation but normal range of motion with internal and external rotation.  No warmth or redness.   Past Medical History:  Diagnosis Date   Gout    Hypertension    Rupture of bowel (HCC)     Assessment/Plan:   2 Days Post-Op Procedure(s)  (LRB): ARTHROSCOPY KNEE, IRRIGATION AND DEBRIDEMENT (Right) Principal Problem:   Acute on chronic respiratory failure with hypoxemia (HCC) Active Problems:   Colostomy status (HCC)   Pressure injury of skin  Estimated body mass index is 41.71 kg/m as calculated from the following:   Height as of this encounter: 6\' 1"  (1.854 m).   Weight as of this encounter: 143.4 kg.  Continue with IV antibiotics per infectious disease.  Hemovac removed today.  Continue with physical therapy.  Patient will need follow-up with [facility] orthopedics in 2 weeks.  Vital signs are stable. hemoglobin 7.6 -recheck hemoglobin tomorrow, consider transfusion if less than 7  Xray of left shoulder negative, shoulder pain improving but still has some discomfort and limitations in motion.  Unable to tolerate MRI.  Have ordered ultrasound evaluation with aspiration.  Will check for fluid and if present will attempt aspiration for cell count and culture   T. Rachelle Hora, PA-C [place] 07/20/2021, 11:27 AM

## 2021-07-20 NOTE — Progress Notes (Addendum)
Assisted patient to MRI patient on 4L O2 [place], MRI unsuccessful due to patient c/o SOB. Patient assisted back to his room, vitals WNL, call bell in place.

## 2021-07-20 NOTE — TOC Progression Note (Signed)
Transition of Care [facility]) - Progression Note    Patient Details  Name: Craig [name] MRN: [id number] Date of Birth: 07/03/1991  Transition of Care [facility]) CM/SW [place], RN Phone Number: 07/20/2021, 1:55 PM  Clinical Narrative:   TOC continues to follow for needs, will get MRI of shoulder when able to maintain O2 sat,    Expected Discharge Plan: [place] Barriers to Discharge: Continued Medical Work up  Expected Discharge Plan and Services Expected Discharge Plan: [place] arrangements for the past 2 months: Single Family Home                                       Social Determinants of Health (SDOH) Interventions    Readmission Risk Interventions Readmission Risk Prevention Plan 07/10/2021  Transportation Screening Complete  PCP or Specialist Appt within 3-5 Days Complete  HRI or [place] Complete  Social Work Consult for [place] Planning/Counseling Complete  Palliative Care Screening Not Applicable  Medication Review [employer]) Complete  Some recent data might be hidden

## 2021-07-21 ENCOUNTER — Inpatient Hospital Stay: Payer: Medicaid Other

## 2021-07-21 ENCOUNTER — Inpatient Hospital Stay: Payer: Self-pay

## 2021-07-21 DIAGNOSIS — B9561 Methicillin susceptible Staphylococcus aureus infection as the cause of diseases classified elsewhere: Secondary | ICD-10-CM | POA: Diagnosis not present

## 2021-07-21 DIAGNOSIS — M00061 Staphylococcal arthritis, right knee: Secondary | ICD-10-CM | POA: Diagnosis not present

## 2021-07-21 DIAGNOSIS — R7881 Bacteremia: Secondary | ICD-10-CM | POA: Diagnosis not present

## 2021-07-21 LAB — CBC
HCT: 30.9 % — ABNORMAL LOW (ref 39.0–52.0)
Hemoglobin: 9.3 g/dL — ABNORMAL LOW (ref 13.0–17.0)
MCH: 28.4 pg (ref 26.0–34.0)
MCHC: 30.1 g/dL (ref 30.0–36.0)
MCV: 94.2 fL (ref 80.0–100.0)
Platelets: 306 10*3/uL (ref 150–400)
RBC: 3.28 MIL/uL — ABNORMAL LOW (ref 4.22–5.81)
RDW: 19.3 % — ABNORMAL HIGH (ref 11.5–15.5)
WBC: 9.6 10*3/uL (ref 4.0–10.5)
nRBC: 1 % — ABNORMAL HIGH (ref 0.0–0.2)

## 2021-07-21 LAB — BASIC METABOLIC PANEL
Anion gap: 10 (ref 5–15)
Anion gap: 8 (ref 5–15)
BUN: 5 mg/dL — ABNORMAL LOW (ref 6–20)
BUN: <5 mg/dL — ABNORMAL LOW (ref 6–20)
CO2: 32 mmol/L (ref 22–32)
CO2: 39 mmol/L — ABNORMAL HIGH (ref 22–32)
Calcium: 9 mg/dL (ref 8.9–10.3)
Calcium: 8.9 mg/dL (ref 8.9–10.3)
Chloride: 94 mmol/L — ABNORMAL LOW (ref 98–111)
Chloride: 89 mmol/L — ABNORMAL LOW (ref 98–111)
Creatinine, Ser: 0.49 mg/dL — ABNORMAL LOW (ref 0.61–1.24)
Creatinine, Ser: 0.38 mg/dL — ABNORMAL LOW (ref 0.61–1.24)
GFR, Estimated: >60 mL/min (ref >60)
GFR, Estimated: >60 mL/min (ref >60)
Glucose, Bld: 252 mg/dL — ABNORMAL HIGH (ref 70–99)
Glucose, Bld: 221 mg/dL — ABNORMAL HIGH (ref 70–99)
Potassium: 3.2 mmol/L — ABNORMAL LOW (ref 3.5–5.1)
Potassium: 2.7 mmol/L — CL (ref 3.5–5.1)
Sodium: 136 mmol/L (ref 135–145)
Sodium: 136 mmol/L (ref 135–145)

## 2021-07-21 LAB — MAGNESIUM: Magnesium: 1.8 mg/dL (ref 1.7–2.4)

## 2021-07-21 LAB — GLUCOSE, CAPILLARY
Glucose-Capillary: 227 mg/dL — ABNORMAL HIGH (ref 70–99)
Glucose-Capillary: 224 mg/dL — ABNORMAL HIGH (ref 70–99)
Glucose-Capillary: 208 mg/dL — ABNORMAL HIGH (ref 70–99)
Glucose-Capillary: 199 mg/dL — ABNORMAL HIGH (ref 70–99)

## 2021-07-21 IMAGING — DX DG CHEST 1V PORT
1 series · 1 of 1 positions shown · non-contrast
Comparison: [DATE]

CLINICAL DATA: History of prior COVID infection with persistent
shortness of breath, initial encounter

EXAM:
PORTABLE CHEST 1 VIEW

[chest ap]
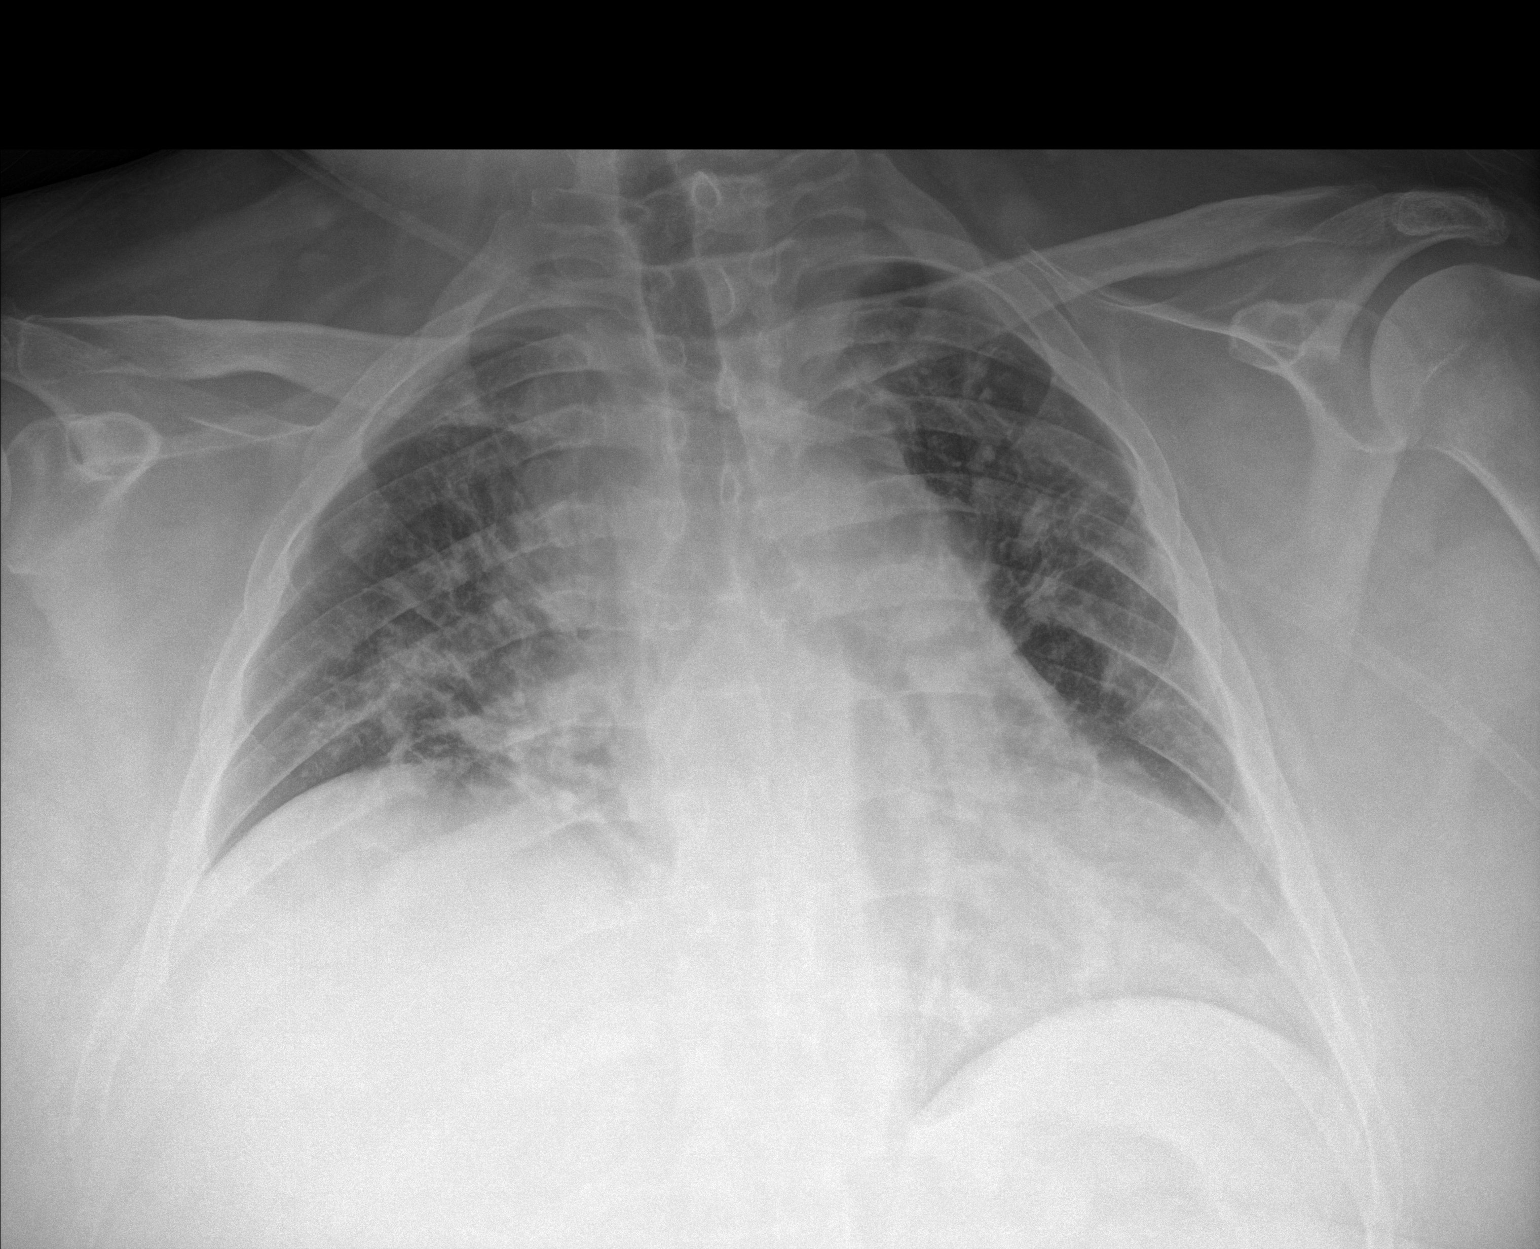

[1 of 1 positions shown; findings below may reference images not displayed]

FINDINGS: Cardiac shadow remains enlarged. Elevation of the right
hemidiaphragm is again seen. Stable airspace opacities are noted
bilaterally similar to that seen on the prior exam. No new focal
abnormality is noted. No bony abnormality is seen.
IMPRESSION: Stable airspace opacities which may be sequelae from the prior COVID
infection.

## 2021-07-21 MED ORDER — CEFAZOLIN IN SODIUM CHLORIDE 3-0.9 GM/100ML-% IV SOLN
3.0000 g | Freq: Three times a day (TID) | INTRAVENOUS | Status: DC
  Administered 2021-07-23 – 2021-07-25 (×7): 3 g via INTRAVENOUS
  Filled 2021-07-21 (×9): qty 100

## 2021-07-21 MED ORDER — SODIUM CHLORIDE 0.9% FLUSH: 10.0000 mL | INTRAVENOUS | Status: DC | PRN

## 2021-07-21 MED ORDER — POTASSIUM CHLORIDE 10 MEQ/100ML IV SOLN
10.0000 meq | INTRAVENOUS | Status: AC
  Administered 2021-07-21 (×4): 10 meq via INTRAVENOUS
  Filled 2021-07-21 (×4): qty 100

## 2021-07-21 MED ORDER — INSULIN GLARGINE-YFGN 100 UNIT/ML ~~LOC~~ SOLN
30.0000 [IU] | Freq: Two times a day (BID) | SUBCUTANEOUS | Status: DC
  Administered 2021-07-21 – 2021-07-22 (×2): 30 [IU] via SUBCUTANEOUS
  Filled 2021-07-21 (×3): qty 0.3

## 2021-07-21 MED ORDER — MAGNESIUM SULFATE 2 GM/50ML IV SOLN
2.0000 g | Freq: Once | INTRAVENOUS | Status: AC
  Administered 2021-07-21: 2 g via INTRAVENOUS
  Filled 2021-07-21: qty 50

## 2021-07-21 MED ORDER — ACETAMINOPHEN 325 MG PO TABS
650.0000 mg | ORAL_TABLET | ORAL | Status: DC | PRN
  Administered 2021-07-21 – 2021-07-23 (×3): 650 mg via ORAL
  Filled 2021-07-21 (×3): qty 2

## 2021-07-21 MED ORDER — SODIUM CHLORIDE 0.9% FLUSH
10.0000 mL | Freq: Two times a day (BID) | INTRAVENOUS | Status: DC
  Administered 2021-07-21 – 2021-07-25 (×7): 10 mL

## 2021-07-21 NOTE — Progress Notes (Signed)
Peripherally Inserted Central Catheter Placement  The IV Nurse has discussed with the patient and/or persons authorized to consent for the patient, the purpose of this procedure and the potential benefits and risks involved with this procedure.  The benefits include less needle sticks, lab draws from the catheter, and the patient may be discharged home with the catheter. Risks include, but not limited to, infection, bleeding, blood clot (thrombus formation), and puncture of an artery; nerve damage and irregular heartbeat and possibility to perform a PICC exchange if needed/ordered by physician.  Alternatives to this procedure were also discussed.  Bard Power PICC patient education guide, fact sheet on infection prevention and patient information card has been provided to patient /or left at bedside.    PICC Placement Documentation  PICC Single Lumen 58/85/02 Right Basilic 45 cm 0 cm (Active)  Indication for Insertion or Continuance of Line Home intravenous therapies (PICC only) 07/21/21 2112  Exposed Catheter (cm) 0 cm 07/21/21 2112  Site Assessment Clean;Dry;Intact 07/21/21 2112  Line Status Flushed;Saline locked;Blood return noted 07/21/21 2112  Dressing Type Transparent;Securing device 07/21/21 2112  Dressing Status Clean;Dry;Intact 07/21/21 2112  Antimicrobial disc in place? Yes 07/21/21 2112  Safety Lock Not Applicable 77/41/28 7867  Dressing Intervention New dressing 07/21/21 2112  Dressing Change Due 07/28/21 07/21/21 2112       Shon Hale 07/21/2021, 9:19 PM

## 2021-07-21 NOTE — Progress Notes (Signed)
Patient is agreeable to PICC, wants to wait for wife to return tonight and sign consent.  Plan to return around 2030.  Bedside nurse aware.

## 2021-07-21 NOTE — Progress Notes (Signed)
PROGRESS NOTE  [name] EQA:[id number] DOB: August 10, 1991 DOA: 07/08/2021 PCP: [name], MD  Brief History   Craig [name] is a 30 y.o. male with significant PMH of extensive medical history including inflammatory arthritis on methotrexate and chronic prednisone at 10 mg daily, prior severe Covid-19 infection requiring prolonged hospitalization and tracheostomy (since decannulated) Dec 2021 - Feb 2022, chronic hypoxic and hypercarbic respiratory failure on 2L O2 PRN, MSSA bacteremia, MSSA pneumonia, perforated diverticulitis s/p partial colectomy with ostomy creation, morbid obesity and gout who presented to the ED with chief complaints of progressive shortness of breath. Patient is currently intubated, history mostly obtained from patient's chart and patient's wife who is currently at the bedside.  Per patient's wife, EMS was called today due to worsening shortness of breath and intermittent altered mental status.  [name] stated the patient has been complaining of right knee pain was prescribed Percocet on Monday.  She took the Percocet at Monday and noticed worsening shortness of breath with mental status change per wife.  He also takes tramadol for pain.  No reports of chest pain, nausea vomiting, abdominal pain, diaphoresis, fevers or chills, cough or any other focal neurological symptoms or deficit.  On EMS arrival patient was noted to be hypoxic with a heart rate between 140 to 150 bpm.  He was placed on 6 L to maintain SPO2 greater than 90% and transported to the ED. In the ED, pt was noted to have seizure-like activity.  Pt was intubated, and PCCM consulted for admission and further management.   Pt was transferred to hospitalist service on 07/14/21.  The patient continues to require 4 L of O2 by [place]. He is on 2L O2 at home. 2/2 blood cultures from 07/08/2021 have grown out MSSA. There is concern for sepsis in the left shoulder. An attempt was made for an MRI to  investigate the shoulder, however, the patient was unable to tolerate MRI due to dyspnea with lying down flat. The patient continues to be volume overloaded. Will continue to diurese and attempt MRI again in a couple of days when his respiratory status is improved and he has some ativan prior to the study.  Infectious disease has placed OPAT orders. PICC line will be ordered. Continue diuresis for now to maximize respiratory status.  Consultants  Infectious disease Orthopedic surgery  Procedures  None  Antibiotics   Anti-infectives (From admission, onward)    Start     Dose/Rate Route Frequency Ordered Stop   07/23/21 1600  ceFAZolin (ANCEF) IVPB 3g/100 mL premix        3 g 200 mL/hr over 30 Minutes Intravenous Every 8 hours 07/21/21 1604     07/10/21 1630  nafcillin 12 g in sodium chloride 0.9 % 500 mL continuous infusion        12 g 20.8 mL/hr over 24 Hours Intravenous Every 24 hours 07/10/21 1511 07/23/21 1600   07/10/21 1600  nafcillin 2 g in sodium chloride 0.9 % 100 mL IVPB        2 g 200 mL/hr over 30 Minutes Intravenous  Once 07/10/21 1511 07/10/21 1632   07/09/21 1000  ceFEPIme (MAXIPIME) 2 g in sodium chloride 0.9 % 100 mL IVPB  Status:  Discontinued        2 g 200 mL/hr over 30 Minutes Intravenous Every 8 hours 07/09/21 0823 07/10/21 1029   07/09/21 0600  ceFAZolin (ANCEF) IVPB 2g/100 mL premix  Status:  Discontinued  2 g 200 mL/hr over 30 Minutes Intravenous Every 8 hours 07/09/21 0311 07/10/21 1511   07/08/21 2000  vancomycin (VANCOREADY) IVPB 1500 mg/300 mL  Status:  Discontinued        1,500 mg 150 mL/hr over 120 Minutes Intravenous Every 8 hours 07/08/21 1900 07/09/21 0311   07/08/21 1830  ceFEPIme (MAXIPIME) 2 g in sodium chloride 0.9 % 100 mL IVPB  Status:  Discontinued        2 g 200 mL/hr over 30 Minutes Intravenous Every 8 hours 07/08/21 1726 07/09/21 0311   07/08/21 0945  vancomycin (VANCOCIN) IVPB 1000 mg/200 mL premix        1,000 mg 200 mL/hr over  60 Minutes Intravenous  Once 07/08/21 0935 07/08/21 1203   07/08/21 0945  ceFEPIme (MAXIPIME) 2 g in sodium chloride 0.9 % 100 mL IVPB        2 g 200 mL/hr over 30 Minutes Intravenous  Once 07/08/21 0935 07/08/21 1146   07/08/21 0945  azithromycin (ZITHROMAX) 500 mg in sodium chloride 0.9 % 250 mL IVPB        500 mg 250 mL/hr over 60 Minutes Intravenous  Once 07/08/21 0935 07/08/21 1420        Subjective  The patient is resting in bed. Wife is at bedside visiting. He continues to appear dyspneic at rest and anxious.  Objective   Vitals:  Vitals:   07/21/21 1213 07/21/21 1651  BP: 130/79 133/72  Pulse: (!) 107 (!) 110  Resp: 18 19  Temp: 98.9 F (37.2 C)   SpO2: 97% 95%   Exam:  Constitutional:  The patient is awake, alert, and oriented x 3. He is in mild distress from dyspnea. Respiratory:  Mild conversational dyspnea Lung sounds are distant No wheezes or rhonchi Scattered rales No tactile fremitus Cardiovascular:  Regular rate and rhythm No murmurs, ectopy, or gallups. No lateral PMI. No thrills. Abdomen:  Abdomen is soft, non-tender, non-distended Morbidly obese No hernias, masses, or organomegaly Normoactive bowel sounds.  Musculoskeletal:  No cyanosis, clubbing 2 + pitting edema of lower extremities bilaterally Skin:  No rashes, lesions, ulcers palpation of skin: no induration or nodules Neurologic:  CN 2-12 intact Sensation all 4 extremities intact Psychiatric:  Mental status Mood, affect appropriate Orientation to person, place, time  judgment and insight appear intact  I have personally reviewed the following:   Today's Data  Vitals  Lab Data  BMP, magnesium  Micro Data  2/2 blood cultures from 07/08/2021 have grown out MSSA.   Imaging    Cardiology Data  EKG  Scheduled Meds:  acetaZOLAMIDE  250 mg Oral BID   allopurinol  300 mg Oral Daily   vitamin C  500 mg Oral BID   chlorhexidine gluconate (MEDLINE KIT)  15 mL Mouth Rinse  BID   Chlorhexidine Gluconate Cloth  6 each Topical Q0600   COVID-19 mRNA bivalent vaccine (Moderna)  0.5 mL Intramuscular Once   DULoxetine  60 mg Oral Daily   enoxaparin (LOVENOX) injection  0.5 mg/kg Subcutaneous Q24H   famotidine  20 mg Oral BID   feeding supplement  1 Container Oral TID BM   folic acid  1 mg Oral Daily   furosemide  60 mg Intravenous BID   insulin aspart  0-20 Units Subcutaneous TID WC   insulin aspart  10 Units Subcutaneous TID WC   insulin glargine-yfgn  30 Units Subcutaneous BID   lidocaine  1 patch Transdermal Q24H   metoprolol succinate  100  mg Oral Daily   multivitamin with minerals  1 tablet Oral Daily   nicotine  21 mg Transdermal Daily   nystatin   Topical TID   predniSONE  5 mg Oral Q breakfast   pregabalin  200 mg Oral BID   sodium chloride flush  10-40 mL Intracatheter Q12H   Continuous Infusions:  sodium chloride Stopped (07/20/21 0640)   [START ON 07/23/2021]  ceFAZolin (ANCEF) IV     nafcillin (NAFCIL) continuous infusion 12 g (07/20/21 1753)    Principal Problem:   Acute on chronic respiratory failure with hypoxemia (HCC) Active Problems:   Colostomy status (HCC)   Pressure injury of skin   LOS: 13 days   A & P  Acute on chronic hypoxic and hypercarbic respiratory failure On chronic 2L home O2 - Extubated 11/23.  Currently on 3L [place], required bipap overnight --Continue supplemental O2 to keep sats >=90%, wean as tolerated   Sepsis 2/2  MSSA bacteremia  Right septic joint s/p I/D on 07/11/21 and again on 07/18/21 -Infectious Disease consulted -s/p TEE - no veg -Repeat blood cultures neg. --purulent drainage noted from the knee wound so pt was taken to OR for 2nd I/D. Plan: --cont nafcillin, Will need 6 weeks of IV antibiotic.  Before discharge will change to cefazolin, per ID. --pain control for right knee --cont Hemovac, to be managed by ortho   Left shoulder pain --Xray of left shoulder negative --MRI of left shoulder to rule  out septic joint   Shock -RESOLVED  - Favor sedation/analgesia related hypotension rather than septic. --s/p pressors --cont home prednisone (for rheumatoid arthritis); no indication for stress dose steroids at this time   Acute toxic metabolic encephalopathy, resolved --2/2 sepsis -CT Head wnl   Seizure Like Activity- RESOLVED -CT Head wnl -Loaded with Keppra 1500 mg x 1 -Stopped Keppra; monitor for recurrence of seizure activity, per neuro   H/o upper extremity DVT It appears that he was started on Xarelto before around Feb/Mar 2022 for provoked upper extremity DVT. He has completed an appropriate course of anticoagulation at this time and therapeutic anticoagulation will be discontinued. CTA chest did not reveal PE on admission.   DM2, well controlled --A1c 6.8 --increase glargine to 24u BID --cont mealtime 10u TID --SSI TID   Probable OSA --look into getting pt qualified for CPAP --CPAP nightly while inpatient   Anasarca --averages about 4L urine output per 1 dose of IV lasix 40 Plan: --cont IV lasix 60 mg BID --strict I/O --d/c Foley today   Contraction alkalosis, improved --improved after starting diamox --cont diamox for now   Hypoalbuminemia  --albumin 2.6 --pt refused nepro, supplement changed to clear boost --cont clear boost TID (however, dieticians keep changing it back to Nepro).    Complicated diverticulitis , perforation, s/p colectomy and colostomy --monitor output --change scheduled Miralax and Colace to PRN due to watery output    I have seen and examined this patient myself. I have spent 34 minutes in his evaluation and care.   DVT prophylaxis: Lovenox SQ Code Status: Full code  Family Communication: mother updated at bedside today   Level of care: Telemetry Surgical Dispo:   The patient is from: home Anticipated d/c is to: home with [facility] and DME, pt and wife refused SNF  Jaeden [place]bay, DO [facility] Direct contact: see [email or web address]   7PM-7AM contact night coverage as above 07/21/2021, 5:05 PM  LOS: 12 days

## 2021-07-21 NOTE — Treatment Plan (Signed)
Diagnosis: MSSA bacteremia and MSSA rt knee septic arthritis Baseline Creatinine <1    Allergies  Allergen Reactions   Amoxicillin Rash    Tolerated cefepime and cefazolin 08/2020.    OPAT Orders Discharge antibiotics: cefazolin 3 grams IV Q 8 hrly Duration: 4 weeks  End Date: 08/10/21  [facility] Per Protocol:  Labs weekly while on IV antibiotics: _X_ CBC with differential  _X_ CMP  _X_ ESR  _X_ Please pull PIC at completion of IV antibiotics Fax weekly labs to Dr.Maida [phone]  Clinic Follow Up Appt: 08/08/21 at 10.15-    Call [phone] with any questions

## 2021-07-21 NOTE — Progress Notes (Cosign Needed Addendum)
Patient suffers from non weight bearing and weakness which impairs their ability to perform daily activities like ADLS in the home.  A walking aide will not resolve issue with performing activities of daily living. A wheelchair will allow patient to safely perform daily activities. Patient can safely propel the wheelchair in the home or has a caregiver who can provide assistance. Length of need 12 months. Accessories: elevating leg rests (ELRs), wheel locks, extensions and anti-tippers, back cushion

## 2021-07-21 NOTE — Progress Notes (Signed)
PHARMACY CONSULT NOTE FOR:  OUTPATIENT  PARENTERAL ANTIBIOTIC THERAPY (OPAT)  Indication: MSSA bacteremia and septic knee Regimen: Cefazolin 3gm IV q8h End date: 08/10/2021  IV antibiotic discharge orders are pended. To discharging provider:  please sign these orders via discharge navigator,  Select New Orders & click on the button choice - Manage This Unsigned Work.     Thank you for allowing pharmacy to be a part of this patient's care.  Doreene Eland, PharmD, BCPS, BCIDP Work Cell: [phone] 07/21/2021 4:12 PM

## 2021-07-21 NOTE — Progress Notes (Signed)
   Subjective: 3 Days Post-Op Procedure(s) (LRB): ARTHROSCOPY KNEE, IRRIGATION AND DEBRIDEMENT (Right) Patient reports right knee pain is mild.  No complaints.  Left shoulder pain stable.  Unable to tolerate MRI due to breathing issues.  Ultrasound from 07/20/2021 showed no fluid in the left glenohumeral joint.   Objective: Vital signs in last 24 hours: Temp:  [97.8 F (36.6 C)-99.7 F (37.6 C)] 98.2 F (36.8 C) (12/02 0500) Pulse Rate:  [97-113] 107 (12/02 0816) Resp:  [18-20] 18 (12/02 0816) BP: (102-148)/(53-77) 148/77 (12/02 0816) SpO2:  [97 %-100 %] 98 % (12/02 0816) Weight:  [141 kg] 141 kg (12/02 0412)  Intake/Output from previous day: 12/01 0701 - 12/02 0700 In: 0  Out: 4800 [Urine:4250; Stool:550] Intake/Output this shift: No intake/output data recorded.  Recent Labs    07/18/21 0843 07/19/21 0301 07/20/21 0403 07/21/21 0245  HGB 10.9* 9.9* 7.6* 9.3*   Recent Labs    07/20/21 0403 07/21/21 0245  WBC 7.6 9.6  RBC 2.78* 3.28*  HCT 26.5* 30.9*  PLT 255 306   Recent Labs    07/20/21 0403 07/20/21 1552 07/21/21 0245  NA 138  --  136  K 2.6* 3.3* 3.2*  CL 94*  --  94*  CO2 37*  --  32  BUN 7  --  5*  CREATININE 0.49*  --  0.49*  GLUCOSE 189*  --  252*  CALCIUM 8.5*  --  9.0   No results for input(s): LABPT, INR in the last 72 hours.  EXAM General - Patient is Alert, Appropriate, and Oriented Right lower extremity - Neurovascular intact Sensation intact distally Intact pulses distally Dorsiflexion/Plantar flexion intact Moderate peripheral edema bilateral lower extremities. No warmth or redness to the lower extremities. Ace wrap in tact, no drainage Left upper extremity: Active abduction to 90 degrees, passively I can increase him to 120 without any significant pain.  He has minimal pain with external rotation but normal range of motion with internal and external rotation.  No warmth or redness.   Past Medical History:  Diagnosis Date   Gout     Hypertension    Rupture of bowel (HCC)     Assessment/Plan:   3 Days Post-Op Procedure(s) (LRB): ARTHROSCOPY KNEE, IRRIGATION AND DEBRIDEMENT (Right) Principal Problem:   Acute on chronic respiratory failure with hypoxemia (HCC) Active Problems:   Colostomy status (HCC)   Pressure injury of skin  Estimated body mass index is 41.01 kg/m as calculated from the following:   Height as of this encounter: 6\' 1"  (1.854 m).   Weight as of this encounter: 141 kg.  Continue with IV antibiotics per infectious disease.  Continue with physical therapy.  Patient will need follow-up with [facility] orthopedics in 2 weeks.  Vital signs are stable. hemoglobin trending up at 9.3.  Xray of left shoulder negative, shoulder pain improving but still has some discomfort and limitations in motion.  Unable to tolerate MRI.  Ultrasound evaluation showed no fluid within the shoulder joint.  Ultrasound and physical exam findings indicate no infection within the left shoulder.   Ronney Asters, PA-C [place] 07/21/2021, 8:20 AM

## 2021-07-21 NOTE — TOC Progression Note (Signed)
Transition of Care [facility]) - Progression Note    Patient Details  Name: Craig [name] MRN: [id number] Date of Birth: 07-10-1991  Transition of Care [facility]) CM/SW [place], RN Phone Number: 07/21/2021, 1:05 PM  Clinical Narrative:   Spoke to the wife today, She requested a wheelchair for the patient, I notified [employer] of the need, Orders are in, Patient will need a bariatric wheelchair due to weight and size    Expected Discharge Plan: [place] Barriers to Discharge: Continued Medical Work up  Expected Discharge Plan and Services Expected Discharge Plan: [place] arrangements for the past 2 months: Single Family Home                                       Social Determinants of Health (SDOH) Interventions    Readmission Risk Interventions Readmission Risk Prevention Plan 07/10/2021  Transportation Screening Complete  PCP or Specialist Appt within 3-5 Days Complete  HRI or [place] Complete  Social Work Consult for [place] Planning/Counseling Complete  Palliative Care Screening Not Applicable  Medication Review [employer] photographer) Complete  Some recent data might be hidden

## 2021-07-22 DIAGNOSIS — R7881 Bacteremia: Secondary | ICD-10-CM | POA: Diagnosis not present

## 2021-07-22 DIAGNOSIS — M00061 Staphylococcal arthritis, right knee: Secondary | ICD-10-CM | POA: Diagnosis not present

## 2021-07-22 DIAGNOSIS — B9561 Methicillin susceptible Staphylococcus aureus infection as the cause of diseases classified elsewhere: Secondary | ICD-10-CM | POA: Diagnosis not present

## 2021-07-22 LAB — BASIC METABOLIC PANEL
Anion gap: 8 (ref 5–15)
Anion gap: 8 (ref 5–15)
BUN: <5 mg/dL — ABNORMAL LOW (ref 6–20)
BUN: 6 mg/dL (ref 6–20)
CO2: 36 mmol/L — ABNORMAL HIGH (ref 22–32)
CO2: 38 mmol/L — ABNORMAL HIGH (ref 22–32)
Calcium: 9 mg/dL (ref 8.9–10.3)
Calcium: 8.7 mg/dL — ABNORMAL LOW (ref 8.9–10.3)
Chloride: 91 mmol/L — ABNORMAL LOW (ref 98–111)
Chloride: 86 mmol/L — ABNORMAL LOW (ref 98–111)
Creatinine, Ser: 0.44 mg/dL — ABNORMAL LOW (ref 0.61–1.24)
Creatinine, Ser: 0.53 mg/dL — ABNORMAL LOW (ref 0.61–1.24)
GFR, Estimated: >60 mL/min (ref >60)
GFR, Estimated: >60 mL/min (ref >60)
Glucose, Bld: 273 mg/dL — ABNORMAL HIGH (ref 70–99)
Glucose, Bld: 307 mg/dL — ABNORMAL HIGH (ref 70–99)
Potassium: 3 mmol/L — ABNORMAL LOW (ref 3.5–5.1)
Potassium: 2.5 mmol/L — CL (ref 3.5–5.1)
Sodium: 135 mmol/L (ref 135–145)
Sodium: 132 mmol/L — ABNORMAL LOW (ref 135–145)

## 2021-07-22 LAB — CBC
HCT: 31.1 % — ABNORMAL LOW (ref 39.0–52.0)
Hemoglobin: 9.2 g/dL — ABNORMAL LOW (ref 13.0–17.0)
MCH: 28 pg (ref 26.0–34.0)
MCHC: 29.6 g/dL — ABNORMAL LOW (ref 30.0–36.0)
MCV: 94.5 fL (ref 80.0–100.0)
Platelets: 270 10*3/uL (ref 150–400)
RBC: 3.29 MIL/uL — ABNORMAL LOW (ref 4.22–5.81)
RDW: 19 % — ABNORMAL HIGH (ref 11.5–15.5)
WBC: 8.4 10*3/uL (ref 4.0–10.5)
nRBC: 0.4 % — ABNORMAL HIGH (ref 0.0–0.2)

## 2021-07-22 LAB — MAGNESIUM: Magnesium: 2 mg/dL (ref 1.7–2.4)

## 2021-07-22 LAB — URIC ACID: Uric Acid, Serum: 4.3 mg/dL (ref 3.7–8.6)

## 2021-07-22 MED ORDER — INSULIN GLARGINE-YFGN 100 UNIT/ML ~~LOC~~ SOLN
38.0000 [IU] | Freq: Two times a day (BID) | SUBCUTANEOUS | Status: DC
  Administered 2021-07-22 – 2021-07-25 (×5): 38 [IU] via SUBCUTANEOUS
  Filled 2021-07-22 (×8): qty 0.38

## 2021-07-22 MED ORDER — POTASSIUM CHLORIDE CRYS ER 20 MEQ PO TBCR
40.0000 meq | EXTENDED_RELEASE_TABLET | ORAL | Status: AC
  Administered 2021-07-22 – 2021-07-23 (×3): 40 meq via ORAL
  Filled 2021-07-22 (×3): qty 2

## 2021-07-22 MED ORDER — POTASSIUM CHLORIDE 10 MEQ/100ML IV SOLN
10.0000 meq | INTRAVENOUS | Status: AC
  Administered 2021-07-22 (×4): 10 meq via INTRAVENOUS
  Filled 2021-07-22 (×4): qty 100

## 2021-07-22 MED ORDER — METHYLPREDNISOLONE SODIUM SUCC 125 MG IJ SOLR
60.0000 mg | Freq: Once | INTRAMUSCULAR | Status: AC
  Administered 2021-07-22: 60 mg via INTRAVENOUS
  Filled 2021-07-22: qty 2

## 2021-07-22 MED ORDER — COLCHICINE 0.6 MG PO TABS
0.6000 mg | ORAL_TABLET | Freq: Two times a day (BID) | ORAL | Status: DC
  Administered 2021-07-22 – 2021-07-25 (×8): 0.6 mg via ORAL
  Filled 2021-07-22 (×8): qty 1

## 2021-07-22 NOTE — Progress Notes (Signed)
PROGRESS NOTE  Craig [name] FTD:[id number] DOB: 06/21/1991 DOA: 07/08/2021 PCP: [name], MD  Brief History   Craig [name] is a 30 y.o. male with significant PMH of extensive medical history including inflammatory arthritis on methotrexate and chronic prednisone at 10 mg daily, prior severe Covid-19 infection requiring prolonged hospitalization and tracheostomy (since decannulated) Dec 2021 - Feb 2022, chronic hypoxic and hypercarbic respiratory failure on 2L O2 PRN, MSSA bacteremia, MSSA pneumonia, perforated diverticulitis s/p partial colectomy with ostomy creation, morbid obesity and gout who presented to the ED with chief complaints of progressive shortness of breath. Patient is currently intubated, history mostly obtained from patient's chart and patient's wife who is currently at the bedside.  Per patient's wife, EMS was called today due to worsening shortness of breath and intermittent altered mental status.  [name] stated the patient has been complaining of right knee pain was prescribed Percocet on Monday.  She took the Percocet at Monday and noticed worsening shortness of breath with mental status change per wife.  He also takes tramadol for pain.  No reports of chest pain, nausea vomiting, abdominal pain, diaphoresis, fevers or chills, cough or any other focal neurological symptoms or deficit.  On EMS arrival patient was noted to be hypoxic with a heart rate between 140 to 150 bpm.  He was placed on 6 L to maintain SPO2 greater than 90% and transported to the ED. In the ED, pt was noted to have seizure-like activity.  Pt was intubated, and PCCM consulted for admission and further management.   Pt was transferred to hospitalist service on 07/14/21.  The patient continues to require 4 L of O2 by [place]. He is on 2L O2 at home. 2/2 blood cultures from 07/08/2021 have grown out MSSA. There is concern for sepsis in the left shoulder. An attempt was made for an MRI to  investigate the shoulder, however, the patient was unable to tolerate MRI due to dyspnea with lying down flat. The patient continues to be volume overloaded. Will continue to diurese and attempt MRI again in a couple of days when his respiratory status is improved and he has some ativan prior to the study.  Infectious disease has placed OPAT orders. PICC line will be ordered. Continue diuresis for now to maximize respiratory status.  Continue to diurese. Possibly ready for discharge tomorrow.  Consultants  Infectious disease Orthopedic surgery  Procedures  None  Antibiotics   Anti-infectives (From admission, onward)    Start     Dose/Rate Route Frequency Ordered Stop   07/23/21 1600  ceFAZolin (ANCEF) IVPB 3g/100 mL premix        3 g 200 mL/hr over 30 Minutes Intravenous Every 8 hours 07/21/21 1604     07/10/21 1630  nafcillin 12 g in sodium chloride 0.9 % 500 mL continuous infusion        12 g 20.8 mL/hr over 24 Hours Intravenous Every 24 hours 07/10/21 1511 07/23/21 1600   07/10/21 1600  nafcillin 2 g in sodium chloride 0.9 % 100 mL IVPB        2 g 200 mL/hr over 30 Minutes Intravenous  Once 07/10/21 1511 07/10/21 1632   07/09/21 1000  ceFEPIme (MAXIPIME) 2 g in sodium chloride 0.9 % 100 mL IVPB  Status:  Discontinued        2 g 200 mL/hr over 30 Minutes Intravenous Every 8 hours 07/09/21 0823 07/10/21 1029   07/09/21 0600  ceFAZolin (ANCEF) IVPB 2g/100 mL premix  Status:  Discontinued        2 g 200 mL/hr over 30 Minutes Intravenous Every 8 hours 07/09/21 0311 07/10/21 1511   07/08/21 2000  vancomycin (VANCOREADY) IVPB 1500 mg/300 mL  Status:  Discontinued        1,500 mg 150 mL/hr over 120 Minutes Intravenous Every 8 hours 07/08/21 1900 07/09/21 0311   07/08/21 1830  ceFEPIme (MAXIPIME) 2 g in sodium chloride 0.9 % 100 mL IVPB  Status:  Discontinued        2 g 200 mL/hr over 30 Minutes Intravenous Every 8 hours 07/08/21 1726 07/09/21 0311   07/08/21 0945  vancomycin  (VANCOCIN) IVPB 1000 mg/200 mL premix        1,000 mg 200 mL/hr over 60 Minutes Intravenous  Once 07/08/21 0935 07/08/21 1203   07/08/21 0945  ceFEPIme (MAXIPIME) 2 g in sodium chloride 0.9 % 100 mL IVPB        2 g 200 mL/hr over 30 Minutes Intravenous  Once 07/08/21 0935 07/08/21 1146   07/08/21 0945  azithromycin (ZITHROMAX) 500 mg in sodium chloride 0.9 % 250 mL IVPB        500 mg 250 mL/hr over 60 Minutes Intravenous  Once 07/08/21 0935 07/08/21 1420        Subjective  The patient is resting in bed. Wife is at bedside visiting. No new complaints. Respirations are improved. Objective   Vitals:  Vitals:   07/22/21 0813 07/22/21 1204  BP: (!) 143/77 119/83  Pulse: (!) 104 (!) 107  Resp: 18 20  Temp: 98.6 F (37 C) 98 F (36.7 C)  SpO2: 99% 100%   Exam:  Constitutional:  The patient is awake, alert, and oriented x 3. He is in mild distress from dyspnea. Respiratory:  Mild conversational dyspnea Lung sounds are distant No wheezes or rhonchi Scattered rales No tactile fremitus Cardiovascular:  Regular rate and rhythm No murmurs, ectopy, or gallups. No lateral PMI. No thrills. Abdomen:  Abdomen is soft, non-tender, non-distended Morbidly obese No hernias, masses, or organomegaly Normoactive bowel sounds.  Musculoskeletal:  No cyanosis, clubbing 2 + pitting edema of lower extremities bilaterally Skin:  No rashes, lesions, ulcers palpation of skin: no induration or nodules Neurologic:  CN 2-12 intact Sensation all 4 extremities intact Psychiatric:  Mental status Mood, affect appropriate Orientation to person, place, time  judgment and insight appear intact  I have personally reviewed the following:   Today's Data  Vitals  Lab Data  BMP  Micro Data  2/2 blood cultures from 07/08/2021 have grown out MSSA.   Imaging    Cardiology Data  EKG  Scheduled Meds:  acetaZOLAMIDE  250 mg Oral BID   allopurinol  300 mg Oral Daily   vitamin C  500 mg  Oral BID   chlorhexidine gluconate (MEDLINE KIT)  15 mL Mouth Rinse BID   Chlorhexidine Gluconate Cloth  6 each Topical Q0600   colchicine  0.6 mg Oral BID   COVID-19 mRNA bivalent vaccine (Moderna)  0.5 mL Intramuscular Once   DULoxetine  60 mg Oral Daily   enoxaparin (LOVENOX) injection  0.5 mg/kg Subcutaneous Q24H   famotidine  20 mg Oral BID   feeding supplement  1 Container Oral TID BM   folic acid  1 mg Oral Daily   furosemide  60 mg Intravenous BID   insulin aspart  0-20 Units Subcutaneous TID WC   insulin aspart  10 Units Subcutaneous TID WC   insulin glargine-yfgn  38  Units Subcutaneous BID   lidocaine  1 patch Transdermal Q24H   metoprolol succinate  100 mg Oral Daily   multivitamin with minerals  1 tablet Oral Daily   nicotine  21 mg Transdermal Daily   nystatin   Topical TID   predniSONE  5 mg Oral Q breakfast   pregabalin  200 mg Oral BID   sodium chloride flush  10-40 mL Intracatheter Q12H   sodium chloride flush  10-40 mL Intracatheter Q12H   Continuous Infusions:  sodium chloride Stopped (07/22/21 0126)   [START ON 07/23/2021]  ceFAZolin (ANCEF) IV     nafcillin (NAFCIL) continuous infusion 20.8 mL/hr at 07/22/21 5631    Principal Problem:   Acute on chronic respiratory failure with hypoxemia [facility]) Active Problems:   Colostomy status (HCC)   Pressure injury of skin   LOS: 14 days   A & P  Acute on chronic hypoxic and hypercarbic respiratory failure On chronic 2L home O2 - Extubated 11/23.  Currently on 3L , required bipap overnight --Continue supplemental O2 to keep sats >=90%, wean as tolerated   Sepsis 2/2  MSSA bacteremia  Right septic joint s/p I/D on 07/11/21 and again on 07/18/21 -Infectious Disease consulted -s/p TEE - no veg -Repeat blood cultures neg. --purulent drainage noted from the knee wound so pt was taken to OR for 2nd I/D. Plan: --cont nafcillin, Will need 6 weeks of IV antibiotic.  Before discharge will change to cefazolin, per  ID. --pain control for right knee --cont Hemovac, to be managed by ortho - PICC line has been placed   Hypokalemia: Supplement and monitor.  Left shoulder pain --Xray of left shoulder negative --MRI of left shoulder to rule out septic joint   Shock -RESOLVED  - Favor sedation/analgesia related hypotension rather than septic. --s/p pressors --cont home prednisone (for rheumatoid arthritis); no indication for stress dose steroids at this time   Acute toxic metabolic encephalopathy, resolved --2/2 sepsis -CT Head wnl   Seizure Like Activity- RESOLVED -CT Head wnl -Loaded with Keppra 1500 mg x 1 -Stopped Keppra; monitor for recurrence of seizure activity, per neuro   H/o upper extremity DVT It appears that he was started on Xarelto before around Feb/Mar 2022 for provoked upper extremity DVT. He has completed an appropriate course of anticoagulation at this time and therapeutic anticoagulation will be discontinued. CTA chest did not reveal PE on admission.   DM2, well controlled --A1c 6.8 --increase glargine to 24u BID --cont mealtime 10u TID --SSI TID   Probable OSA --look into getting pt qualified for CPAP --CPAP nightly while inpatient   Anasarca --averages about 4L urine output per 1 dose of IV lasix 40 Plan: --cont IV lasix 60 mg BID --strict I/O   Contraction alkalosis, improved --improved after starting diamox --cont diamox for now   Hypoalbuminemia  --albumin 2.6 --pt refused nepro, supplement changed to clear boost --cont clear boost TID (however, dieticians keep changing it back to Nepro).    Complicated diverticulitis , perforation, s/p colectomy and colostomy --monitor output --change scheduled Miralax and Colace to PRN due to watery output    I have seen and examined this patient myself. I have spent 34 minutes in his evaluation and care.   DVT prophylaxis: Lovenox SQ Code Status: Full code  Family Communication: mother updated at bedside today    Level of care: Telemetry Surgical Dispo:   The patient is from: home Anticipated d/c is to: home with [facility] and DME, pt and wife refused  SNF  Craig Bixby, DO [facility] Direct contact: see [email or web address]  7PM-7AM contact night coverage as above 07/22/2021, 3:33 PM  LOS: 12 days

## 2021-07-23 DIAGNOSIS — B9561 Methicillin susceptible Staphylococcus aureus infection as the cause of diseases classified elsewhere: Secondary | ICD-10-CM | POA: Diagnosis not present

## 2021-07-23 DIAGNOSIS — M00061 Staphylococcal arthritis, right knee: Secondary | ICD-10-CM | POA: Diagnosis not present

## 2021-07-23 DIAGNOSIS — R7881 Bacteremia: Secondary | ICD-10-CM | POA: Diagnosis not present

## 2021-07-23 LAB — CBC WITH DIFFERENTIAL/PLATELET
Abs Immature Granulocytes: 0.08 10*3/uL — ABNORMAL HIGH (ref 0.00–0.07)
Basophils Absolute: 0 10*3/uL (ref 0.0–0.1)
Basophils Relative: 1 %
Eosinophils Absolute: 0.1 10*3/uL (ref 0.0–0.5)
Eosinophils Relative: 2 %
HCT: 30.3 % — ABNORMAL LOW (ref 39.0–52.0)
Hemoglobin: 9 g/dL — ABNORMAL LOW (ref 13.0–17.0)
Immature Granulocytes: 1 %
Lymphocytes Relative: 25 %
Lymphs Abs: 1.6 10*3/uL (ref 0.7–4.0)
MCH: 28.4 pg (ref 26.0–34.0)
MCHC: 29.7 g/dL — ABNORMAL LOW (ref 30.0–36.0)
MCV: 95.6 fL (ref 80.0–100.0)
Monocytes Absolute: 1 10*3/uL (ref 0.1–1.0)
Monocytes Relative: 15 %
Neutro Abs: 3.7 10*3/uL (ref 1.7–7.7)
Neutrophils Relative %: 56 %
Platelets: 271 10*3/uL (ref 150–400)
RBC: 3.17 MIL/uL — ABNORMAL LOW (ref 4.22–5.81)
RDW: 19.3 % — ABNORMAL HIGH (ref 11.5–15.5)
WBC: 6.5 10*3/uL (ref 4.0–10.5)
nRBC: 0.3 % — ABNORMAL HIGH (ref 0.0–0.2)

## 2021-07-23 LAB — BASIC METABOLIC PANEL
Anion gap: 8 (ref 5–15)
Anion gap: 12 (ref 5–15)
BUN: <5 mg/dL — ABNORMAL LOW (ref 6–20)
BUN: <5 mg/dL — ABNORMAL LOW (ref 6–20)
CO2: 38 mmol/L — ABNORMAL HIGH (ref 22–32)
CO2: 37 mmol/L — ABNORMAL HIGH (ref 22–32)
Calcium: 9.3 mg/dL (ref 8.9–10.3)
Calcium: 9.4 mg/dL (ref 8.9–10.3)
Chloride: 90 mmol/L — ABNORMAL LOW (ref 98–111)
Chloride: 91 mmol/L — ABNORMAL LOW (ref 98–111)
Creatinine, Ser: 0.37 mg/dL — ABNORMAL LOW (ref 0.61–1.24)
Creatinine, Ser: 0.4 mg/dL — ABNORMAL LOW (ref 0.61–1.24)
GFR, Estimated: >60 mL/min (ref >60)
GFR, Estimated: >60 mL/min (ref >60)
Glucose, Bld: 228 mg/dL — ABNORMAL HIGH (ref 70–99)
Glucose, Bld: 234 mg/dL — ABNORMAL HIGH (ref 70–99)
Potassium: 2.9 mmol/L — ABNORMAL LOW (ref 3.5–5.1)
Potassium: 3 mmol/L — ABNORMAL LOW (ref 3.5–5.1)
Sodium: 136 mmol/L (ref 135–145)
Sodium: 140 mmol/L (ref 135–145)

## 2021-07-23 LAB — MAGNESIUM: Magnesium: 1.9 mg/dL (ref 1.7–2.4)

## 2021-07-23 MED ORDER — POTASSIUM CHLORIDE CRYS ER 20 MEQ PO TBCR
40.0000 meq | EXTENDED_RELEASE_TABLET | Freq: Once | ORAL | Status: AC
  Administered 2021-07-23: 10:00:00 40 meq via ORAL
  Filled 2021-07-23: qty 2

## 2021-07-23 MED ORDER — POTASSIUM CHLORIDE 10 MEQ/100ML IV SOLN
10.0000 meq | INTRAVENOUS | Status: AC
  Administered 2021-07-23 (×4): 10 meq via INTRAVENOUS
  Filled 2021-07-23 (×4): qty 100

## 2021-07-23 NOTE — Progress Notes (Signed)
Date of Admission:  07/08/2021     ID: Craig [name] is a 30 y.o. male  Principal Problem:   Acute on chronic respiratory failure with hypoxemia (HCC) Active Problems:   Colostomy status ([place])   Pressure injury of skin  His dad and son are at the bedside  Subjective: Patient doing better Right knee pain better Left shoulder I, able to move better but complete abduction not possible Same with rt shoulder  Medications:   acetaZOLAMIDE  250 mg Oral BID   allopurinol  300 mg Oral Daily   vitamin C  500 mg Oral BID   chlorhexidine gluconate (MEDLINE KIT)  15 mL Mouth Rinse BID   Chlorhexidine Gluconate Cloth  6 each Topical Q0600   colchicine  0.6 mg Oral BID   COVID-19 mRNA bivalent vaccine (Moderna)  0.5 mL Intramuscular Once   DULoxetine  60 mg Oral Daily   enoxaparin (LOVENOX) injection  0.5 mg/kg Subcutaneous Q24H   famotidine  20 mg Oral BID   feeding supplement  1 Container Oral TID BM   folic acid  1 mg Oral Daily   furosemide  60 mg Intravenous BID   insulin aspart  0-20 Units Subcutaneous TID WC   insulin aspart  10 Units Subcutaneous TID WC   insulin glargine-yfgn  38 Units Subcutaneous BID   lidocaine  1 patch Transdermal Q24H   metoprolol succinate  100 mg Oral Daily   multivitamin with minerals  1 tablet Oral Daily   nicotine  21 mg Transdermal Daily   nystatin   Topical TID   predniSONE  5 mg Oral Q breakfast   pregabalin  200 mg Oral BID   sodium chloride flush  10-40 mL Intracatheter Q12H   sodium chloride flush  10-40 mL Intracatheter Q12H    Objective: Vital signs in last 24 hours: Temp:  [98.2 F (36.8 C)-98.6 F (37 C)] 98.2 F (36.8 C) (12/04 1226) Pulse Rate:  [102-123] 107 (12/04 1226) Resp:  [18-24] 24 (12/04 1226) BP: (116-152)/(50-88) 126/70 (12/04 1226) SpO2:  [99 %-100 %] 100 % (12/04 1226) Weight:  [140.1 kg] 140.1 kg (12/04 0340)  PHYSICAL EXAM:  General: awake and alert,  Lungs: b/l air entry- decreased air entry  bases Heart: s1s2 Abdomen: Soft,colostomy  Extremities: rt knee- surgical dressing Hemovact drain has been removed. Edematous extremities B/l feet - dorsum - scabbed wound  Neurologic: grossly non focal  Left shoulder no warmth.  Passive abduction possible  foley has been removed Right midline catheter Lab Results Recent Labs    07/22/21 0517 07/22/21 1706 07/23/21 0441  WBC 8.4  --  6.5  HGB 9.2*  --  9.0*  HCT 31.1*  --  30.3*  NA 135 132* 136  K 3.0* 2.5* 2.9*  CL 91* 86* 90*  CO2 36* 38* 38*  BUN <5* 6 <5*  CREATININE 0.44* 0.53* 0.37*   Liver Panel No results for input(s): PROT, ALBUMIN, AST, ALT, ALKPHOS, BILITOT, BILIDIR, IBILI in the last 72 hours.  Sedimentation Rate No results for input(s): ESRSEDRATE in the last 72 hours. C-Reactive Protein No results for input(s): CRP in the last 72 hours.  Microbiology: 07/08/21 BC- MSSA 07/09/21- BC NG 07/11/21- rt knee synovial culture- staph aureus     Assessment/Plan: MSSa bacteremia MSSA rt knee septic arthritis- s/p I/D X 2 TEE neg Repeat blood culture neg Was on nafcillin and after 13 days changed to cefazolin in preparation for discharge- it is 3 grams Q 8  based on his weight HE will receive IV antibiotics until 08/10/21- We will follow this with PO cefadrolxil for 2 weeks - HE will see me on 08/08/21 and will assess and do the needful  Left shoulder pain- septic arthritis was questioned- ortho saw him- as MRI couldn't be done US done and it showed no effusion- also passive abduction above 90 deg possible- so they did not think it was infeciton   Acute hypoxic resp failure- resolved s/p extubation OSA - not formally diagnosed- CPAP  H/O COVID with long hospitalization in Dec- Fb 2022 , needing tracheostomy and PEG- both have been removed  Polyneuropathy- critical illness related   Complicated diverticulitis , perforation, s/p colectomy and colostomy Discussed the management with patient . Wife and  care team. ID will sign off- call if needed

## 2021-07-23 NOTE — TOC Progression Note (Signed)
Transition of Care [facility]) - Progression Note    Patient Details  Name: Craig [name] MRN: [id number] Date of Birth: 04-02-1991  Transition of Care [facility]) CM/SW [place], RN Phone Number: 07/23/2021, 3:39 PM  Clinical Narrative:   Patient not discharging today,  P.Tamera Punt, infusion nurse aware.  TOC to follow.    Expected Discharge Plan: [place] Barriers to Discharge: Continued Medical Work up  Expected Discharge Plan and Services Expected Discharge Plan: [place] arrangements for the past 2 months: Single Family Home                                       Social Determinants of Health (SDOH) Interventions    Readmission Risk Interventions Readmission Risk Prevention Plan 07/10/2021  Transportation Screening Complete  PCP or Specialist Appt within 3-5 Days Complete  HRI or [place] Complete  Social Work Consult for [place] Planning/Counseling Complete  Palliative Care Screening Not Applicable  Medication Review [employer]) Complete  Some recent data might be hidden

## 2021-07-23 NOTE — Progress Notes (Signed)
   Subjective: 5 Days Post-Op Procedure(s) (LRB): ARTHROSCOPY KNEE, IRRIGATION AND DEBRIDEMENT (Right) Patient reports right knee pain is mild, unless moving.  No new complaints.  Left shoulder pain stable.  Is doing better with range of motion of the left shoulder.  Unable to tolerate MRI due to breathing issues.  Ultrasound from 07/20/2021 showed no fluid in the left glenohumeral joint.   Objective: Vital signs in last 24 hours: Temp:  [98 F (36.7 C)-98.6 F (37 C)] 98.2 F (36.8 C) (12/04 0344) Pulse Rate:  [102-108] 102 (12/04 0344) Resp:  [18-20] 20 (12/04 0344) BP: (116-143)/(50-83) 117/50 (12/04 0344) SpO2:  [99 %-100 %] 100 % (12/04 0344) Weight:  [140.1 kg] 140.1 kg (12/04 0340)  Intake/Output from previous day: 12/03 0701 - 12/04 0700 In: 370 [P.O.:360; I.V.:10] Out: 5960 [Urine:5610; Stool:350] Intake/Output this shift: No intake/output data recorded.  Recent Labs    07/21/21 0245 07/22/21 0517 07/23/21 0441  HGB 9.3* 9.2* 9.0*   Recent Labs    07/22/21 0517 07/23/21 0441  WBC 8.4 6.5  RBC 3.29* 3.17*  HCT 31.1* 30.3*  PLT 270 271   Recent Labs    07/22/21 1706 07/23/21 0441  NA 132* 136  K 2.5* 2.9*  CL 86* 90*  CO2 38* 38*  BUN 6 <5*  CREATININE 0.53* 0.37*  GLUCOSE 307* 228*  CALCIUM 8.7* 9.3   No results for input(s): LABPT, INR in the last 72 hours.  EXAM General - Patient is Alert, Appropriate, and Oriented Right lower extremity - Neurovascular intact Sensation intact distally Intact pulses distally Dorsiflexion/Plantar flexion intact Moderate peripheral edema bilateral lower extremities. No warmth or redness to the lower extremities. Ace wrap in tact, no drainage Left upper extremity: Active abduction to 90 degrees, passively I can increase him to 120 without any significant pain.  He has minimal pain with external rotation but normal range of motion with internal and external rotation.  No warmth or redness.   Past Medical  History:  Diagnosis Date   Gout    Hypertension    Rupture of bowel (HCC)     Assessment/Plan:   5 Days Post-Op Procedure(s) (LRB): ARTHROSCOPY KNEE, IRRIGATION AND DEBRIDEMENT (Right) Principal Problem:   Acute on chronic respiratory failure with hypoxemia (HCC) Active Problems:   Colostomy status (HCC)   Pressure injury of skin  Estimated body mass index is 40.74 kg/m as calculated from the following:   Height as of this encounter: 6\' 1"  (1.854 m).   Weight as of this encounter: 140.1 kg.  Continue with IV antibiotics per infectious disease.  Continue with physical therapy.  Patient will need follow-up with [facility] in 2 weeks.  Vital signs are stable. hemoglobin trending up at 9.0.  Xray of left shoulder negative, shoulder pain improving but still has some discomfort and limitations in motion.  Unable to tolerate MRI.  Ultrasound evaluation showed no fluid within the shoulder joint.  Ultrasound and physical exam findings indicate no infection within the left shoulder.   Reche Dixon, PA-C [place] 07/23/2021, 7:18 AM

## 2021-07-23 NOTE — Progress Notes (Signed)
   07/23/21 0818  Assess: MEWS Score  Temp 98.2 F (36.8 C)  BP (!) 151/83  Pulse Rate (!) 103  Resp (!) 22  SpO2 100 %  O2 Device CPAP  Assess: MEWS Score  MEWS Temp 0  MEWS Systolic 0  MEWS Pulse 1  MEWS RR 1  MEWS LOC 0  MEWS Score 2  MEWS Score Color Yellow  Assess: if the MEWS score is Yellow or Red  Were vital signs taken at a resting state? Yes  Focused Assessment No change from prior assessment  Does the patient meet 2 or more of the SIRS criteria? No  Does the patient have a confirmed or suspected source of infection? No  MEWS guidelines implemented *See Row Information* Yes  Take Vital Signs  Increase Vital Sign Frequency  Yellow: Q 2hr X 2 then Q 4hr X 2, if remains yellow, continue Q 4hrs  Escalate  MEWS: Escalate Yellow: discuss with charge nurse/RN and consider discussing with provider and RRT  Notify: Charge Nurse/RN  Name of Charge Nurse/RN Notified Producer, television/film/video  Date Charge Nurse/RN Notified 07/23/21  Time Charge Nurse/RN Notified 0818  Document  Patient Outcome Other (Comment) (no changes, pain medication given, patient was repositioned)  Progress note created (see row info) Yes  Assess: SIRS CRITERIA  SIRS Temperature  0  SIRS Pulse 1  SIRS Respirations  1  SIRS WBC 0  SIRS Score Sum  2

## 2021-07-23 NOTE — Progress Notes (Signed)
   07/23/21 1047  Assess: MEWS Score  Temp 98.6 F (37 C)  BP (!) 152/88  Pulse Rate (!) 123  Resp 18  SpO2 100 %  O2 Device Nasal Cannula  Assess: MEWS Score  MEWS Temp 0  MEWS Systolic 0  MEWS Pulse 2  MEWS RR 0  MEWS LOC 0  MEWS Score 2  MEWS Score Color Yellow  Assess: if the MEWS score is Yellow or Red  Were vital signs taken at a resting state? Yes  Focused Assessment No change from prior assessment  Does the patient meet 2 or more of the SIRS criteria? No  Does the patient have a confirmed or suspected source of infection? No  MEWS guidelines implemented *See Row Information* No, previously yellow, continue vital signs every 4 hours  Assess: SIRS CRITERIA  SIRS Temperature  0  SIRS Pulse 1  SIRS Respirations  0  SIRS WBC 0  SIRS Score Sum  1

## 2021-07-23 NOTE — Progress Notes (Signed)
PROGRESS NOTE  Craig [name] TKP:[id number] DOB: 1991/05/02 DOA: 07/08/2021 PCP: [name], MD  Brief History   Craig [name] is a 30 y.o. male with significant PMH of extensive medical history including inflammatory arthritis on methotrexate and chronic prednisone at 10 mg daily, prior severe Covid-19 infection requiring prolonged hospitalization and tracheostomy (since decannulated) Dec 2021 - Feb 2022, chronic hypoxic and hypercarbic respiratory failure on 2L O2 PRN, MSSA bacteremia, MSSA pneumonia, perforated diverticulitis s/p partial colectomy with ostomy creation, morbid obesity and gout who presented to the ED with chief complaints of progressive shortness of breath. Patient is currently intubated, history mostly obtained from patient's chart and patient's wife who is currently at the bedside.  Per patient's wife, EMS was called today due to worsening shortness of breath and intermittent altered mental status.  [name] stated the patient has been complaining of right knee pain was prescribed Percocet on Monday.  She took the Percocet at Monday and noticed worsening shortness of breath with mental status change per wife.  He also takes tramadol for pain.  No reports of chest pain, nausea vomiting, abdominal pain, diaphoresis, fevers or chills, cough or any other focal neurological symptoms or deficit.  On EMS arrival patient was noted to be hypoxic with a heart rate between 140 to 150 bpm.  He was placed on 6 L to maintain SPO2 greater than 90% and transported to the ED. In the ED, pt was noted to have seizure-like activity.  Pt was intubated, and PCCM consulted for admission and further management.   Pt was transferred to hospitalist service on 07/14/21.  The patient continues to require 4 L of O2 by [place]. He is on 2L O2 at home. 2/2 blood cultures from 07/08/2021 have grown out MSSA. There is concern for sepsis in the left shoulder. An attempt was made for an MRI to  investigate the shoulder, however, the patient was unable to tolerate MRI due to dyspnea with lying down flat. The patient continues to be volume overloaded. Will continue to diurese and attempt MRI again in a couple of days when his respiratory status is improved and he has some ativan prior to the study.  Infectious disease has placed OPAT orders. PICC line will be ordered. Continue diuresis for now to maximize respiratory status.  Potassium this am is 2.5. Supplement and monitor.  Consultants  Infectious disease Orthopedic surgery  Procedures  None  Antibiotics   Anti-infectives (From admission, onward)    Start     Dose/Rate Route Frequency Ordered Stop   07/23/21 1600  ceFAZolin (ANCEF) IVPB 3g/100 mL premix        3 g 200 mL/hr over 30 Minutes Intravenous Every 8 hours 07/21/21 1604     07/10/21 1630  nafcillin 12 g in sodium chloride 0.9 % 500 mL continuous infusion        12 g 20.8 mL/hr over 24 Hours Intravenous Every 24 hours 07/10/21 1511 07/23/21 1759   07/10/21 1600  nafcillin 2 g in sodium chloride 0.9 % 100 mL IVPB        2 g 200 mL/hr over 30 Minutes Intravenous  Once 07/10/21 1511 07/10/21 1632   07/09/21 1000  ceFEPIme (MAXIPIME) 2 g in sodium chloride 0.9 % 100 mL IVPB  Status:  Discontinued        2 g 200 mL/hr over 30 Minutes Intravenous Every 8 hours 07/09/21 0823 07/10/21 1029   07/09/21 0600  ceFAZolin (ANCEF) IVPB 2g/100 mL premix  Status:  Discontinued        2 g 200 mL/hr over 30 Minutes Intravenous Every 8 hours 07/09/21 0311 07/10/21 1511   07/08/21 2000  vancomycin (VANCOREADY) IVPB 1500 mg/300 mL  Status:  Discontinued        1,500 mg 150 mL/hr over 120 Minutes Intravenous Every 8 hours 07/08/21 1900 07/09/21 0311   07/08/21 1830  ceFEPIme (MAXIPIME) 2 g in sodium chloride 0.9 % 100 mL IVPB  Status:  Discontinued        2 g 200 mL/hr over 30 Minutes Intravenous Every 8 hours 07/08/21 1726 07/09/21 0311   07/08/21 0945  vancomycin (VANCOCIN) IVPB  1000 mg/200 mL premix        1,000 mg 200 mL/hr over 60 Minutes Intravenous  Once 07/08/21 0935 07/08/21 1203   07/08/21 0945  ceFEPIme (MAXIPIME) 2 g in sodium chloride 0.9 % 100 mL IVPB        2 g 200 mL/hr over 30 Minutes Intravenous  Once 07/08/21 0935 07/08/21 1146   07/08/21 0945  azithromycin (ZITHROMAX) 500 mg in sodium chloride 0.9 % 250 mL IVPB        500 mg 250 mL/hr over 60 Minutes Intravenous  Once 07/08/21 0935 07/08/21 1420        Subjective  The patient is resting in bed. Wife is at bedside. No new complaints.  Objective   Vitals:  Vitals:   07/23/21 1047 07/23/21 1226  BP: (!) 152/88 126/70  Pulse: (!) 123 (!) 107  Resp: 18 (!) 24  Temp: 98.6 F (37 C) 98.2 F (36.8 C)  SpO2: 100% 100%   Exam:  Constitutional:  The patient is awake, alert, and oriented x 3. No acute ditress Mild conversational dyspnea Lung sounds are distant No wheezes or rhonchi Scattered rales No tactile fremitus Cardiovascular:  Regular rate and rhythm No murmurs, ectopy, or gallups. No lateral PMI. No thrills. Abdomen:  Abdomen is soft, non-tender, non-distended Morbidly obese No hernias, masses, or organomegaly Normoactive bowel sounds.  Musculoskeletal:  No cyanosis, clubbing 1 + pitting edema of lower extremities bilaterally Skin:  No rashes, lesions, ulcers palpation of skin: no induration or nodules Neurologic:  CN 2-12 intact Sensation all 4 extremities intact Psychiatric:  Mental status Mood, affect appropriate Orientation to person, place, time  judgment and insight appear intact  I have personally reviewed the following:   Today's Data  Vitals  Lab Data  BMP  Micro Data  2/2 blood cultures from 07/08/2021 have grown out MSSA.   Imaging    Cardiology Data  EKG  Scheduled Meds:  acetaZOLAMIDE  250 mg Oral BID   allopurinol  300 mg Oral Daily   vitamin C  500 mg Oral BID   chlorhexidine gluconate (MEDLINE KIT)  15 mL Mouth Rinse BID    Chlorhexidine Gluconate Cloth  6 each Topical Q0600   colchicine  0.6 mg Oral BID   COVID-19 mRNA bivalent vaccine (Moderna)  0.5 mL Intramuscular Once   DULoxetine  60 mg Oral Daily   enoxaparin (LOVENOX) injection  0.5 mg/kg Subcutaneous Q24H   famotidine  20 mg Oral BID   feeding supplement  1 Container Oral TID BM   folic acid  1 mg Oral Daily   furosemide  60 mg Intravenous BID   insulin aspart  0-20 Units Subcutaneous TID WC   insulin aspart  10 Units Subcutaneous TID WC   insulin glargine-yfgn  38 Units Subcutaneous BID   lidocaine  1  patch Transdermal Q24H   metoprolol succinate  100 mg Oral Daily   multivitamin with minerals  1 tablet Oral Daily   nicotine  21 mg Transdermal Daily   nystatin   Topical TID   predniSONE  5 mg Oral Q breakfast   pregabalin  200 mg Oral BID   sodium chloride flush  10-40 mL Intracatheter Q12H   sodium chloride flush  10-40 mL Intracatheter Q12H   Continuous Infusions:  sodium chloride Stopped (07/22/21 0126)    ceFAZolin (ANCEF) IV     nafcillin (NAFCIL) continuous infusion 12 g (07/22/21 1822)    Principal Problem:   Acute on chronic respiratory failure with hypoxemia (HCC) Active Problems:   Colostomy status (HCC)   Pressure injury of skin   LOS: 15 days   A & P  Acute on chronic hypoxic and hypercarbic respiratory failure On chronic 2L home O2 - Extubated 11/23.  Currently on 3L [place], required bipap overnight --Continue supplemental O2 to keep sats >=90%, wean as tolerated. The patient is currently on 3L to maintain saturations of 100%. Wean as possible.    Sepsis 2/2  MSSA bacteremia  Right septic joint s/p I/D on 07/11/21 and again on 07/18/21 -Infectious Disease consulted -s/p TEE - no veg -Repeat blood cultures neg. --purulent drainage noted from the knee wound so pt was taken to OR for 2nd I/D. Plan: --cont nafcillin, Will need 6 weeks of IV antibiotic.  Before discharge will change to cefazolin, per ID. --pain control  for right knee --cont Hemovac, to be managed by ortho - PICC line has been placed - Per infectious disease the patient will require cefazolin 3 gm IV Q8 hr x 4 weeks. End date will be 08/10/2021.   Hypokalemia: Supplement and monitor.  Left shoulder pain --Xray of left shoulder negative --MRI of left shoulder to rule out septic joint could not be done due to patient's dyspnea with lying flat.   Shock -RESOLVED  - Favor sedation/analgesia related hypotension rather than septic. --s/p pressors --cont home prednisone (for rheumatoid arthritis); no indication for stress dose steroids at this time   Acute toxic metabolic encephalopathy, resolved --2/2 sepsis -CT Head wnl   Seizure Like Activity- RESOLVED -CT Head wnl -Loaded with Keppra 1500 mg x 1 -Stopped Keppra; monitor for recurrence of seizure activity, per neuro   H/o upper extremity DVT It appears that he was started on Xarelto before around Feb/Mar 2022 for provoked upper extremity DVT. He has completed an appropriate course of anticoagulation at this time and therapeutic anticoagulation will be discontinued. CTA chest did not reveal PE on admission.   DM2, well controlled --A1c 6.8 --increase glargine to 24u BID --cont mealtime 10u TID --SSI TID   Probable OSA --look into getting pt qualified for CPAP --CPAP nightly while inpatient   Anasarca --averages about 4L urine output per 1 dose of IV lasix 40 Plan: --cont IV lasix 60 mg BID --strict I/O  Hypokalemia: Severe due to aggressive diuresis. Monitor and supplement.  Contraction alkalosis, improved --improved after starting diamox --cont diamox for now   Hypoalbuminemia  --albumin 2.6 --pt refused nepro, supplement changed to clear boost --cont clear boost TID (however, dieticians keep changing it back to Nepro).    Complicated diverticulitis , perforation, s/p colectomy and colostomy --monitor output --change scheduled Miralax and Colace to PRN due to  watery output    I have seen and examined this patient myself. I have spent 34 minutes in his evaluation and care.  DVT prophylaxis: Lovenox SQ Code Status: Full code  Family Communication: mother updated at bedside today   Level of care: Telemetry Surgical Dispo:   The patient is from: home Anticipated d/c is to: home with [facility] and DME, pt and wife refused SNF  Kiani Wurtzel, DO [facility] Direct contact: see [email or web address]  7PM-7AM contact night coverage as above 07/23/2021, 2:14 PM  LOS: 12 days

## 2021-07-23 NOTE — Plan of Care (Signed)
  Problem: Education: Goal: Knowledge of General Education information will improve Description: Including pain rating scale, medication(s)/side effects and non-pharmacologic comfort measures Outcome: Progressing   Problem: Health Behavior/Discharge Planning: Goal: Ability to manage health-related needs will improve Outcome: Progressing   Problem: Clinical Measurements: Goal: Respiratory complications will improve Outcome: Progressing   Problem: Clinical Measurements: Goal: Cardiovascular complication will be avoided Outcome: Progressing   Problem: Activity: Goal: Risk for activity intolerance will decrease Outcome: Progressing   Problem: Coping: Goal: Level of anxiety will decrease Outcome: Progressing   Problem: Nutrition: Goal: Adequate nutrition will be maintained Outcome: Progressing   Problem: Elimination: Goal: Will not experience complications related to bowel motility Outcome: Progressing   Problem: Safety: Goal: Ability to remain free from injury will improve Outcome: Progressing   Problem: Skin Integrity: Goal: Risk for impaired skin integrity will decrease Outcome: Progressing

## 2021-07-24 DIAGNOSIS — M00061 Staphylococcal arthritis, right knee: Secondary | ICD-10-CM | POA: Diagnosis not present

## 2021-07-24 DIAGNOSIS — R7881 Bacteremia: Secondary | ICD-10-CM | POA: Diagnosis not present

## 2021-07-24 DIAGNOSIS — B9561 Methicillin susceptible Staphylococcus aureus infection as the cause of diseases classified elsewhere: Secondary | ICD-10-CM | POA: Diagnosis not present

## 2021-07-24 LAB — CBC
HCT: 33 % — ABNORMAL LOW (ref 39.0–52.0)
Hemoglobin: 9.5 g/dL — ABNORMAL LOW (ref 13.0–17.0)
MCH: 27.5 pg (ref 26.0–34.0)
MCHC: 28.8 g/dL — ABNORMAL LOW (ref 30.0–36.0)
MCV: 95.7 fL (ref 80.0–100.0)
Platelets: 310 10*3/uL (ref 150–400)
RBC: 3.45 MIL/uL — ABNORMAL LOW (ref 4.22–5.81)
RDW: 19.7 % — ABNORMAL HIGH (ref 11.5–15.5)
WBC: 7.9 10*3/uL (ref 4.0–10.5)
nRBC: 0.3 % — ABNORMAL HIGH (ref 0.0–0.2)

## 2021-07-24 LAB — GLUCOSE, CAPILLARY
Glucose-Capillary: 182 mg/dL — ABNORMAL HIGH (ref 70–99)
Glucose-Capillary: 264 mg/dL — ABNORMAL HIGH (ref 70–99)
Glucose-Capillary: 268 mg/dL — ABNORMAL HIGH (ref 70–99)
Glucose-Capillary: 279 mg/dL — ABNORMAL HIGH (ref 70–99)
Glucose-Capillary: 345 mg/dL — ABNORMAL HIGH (ref 70–99)
Glucose-Capillary: 245 mg/dL — ABNORMAL HIGH (ref 70–99)
Glucose-Capillary: 165 mg/dL — ABNORMAL HIGH (ref 70–99)
Glucose-Capillary: 218 mg/dL — ABNORMAL HIGH (ref 70–99)
Glucose-Capillary: 182 mg/dL — ABNORMAL HIGH (ref 70–99)
Glucose-Capillary: 223 mg/dL — ABNORMAL HIGH (ref 70–99)
Glucose-Capillary: 167 mg/dL — ABNORMAL HIGH (ref 70–99)
Glucose-Capillary: 163 mg/dL — ABNORMAL HIGH (ref 70–99)
Glucose-Capillary: 190 mg/dL — ABNORMAL HIGH (ref 70–99)

## 2021-07-24 LAB — BASIC METABOLIC PANEL
Anion gap: 10 (ref 5–15)
Anion gap: 9 (ref 5–15)
BUN: 5 mg/dL — ABNORMAL LOW (ref 6–20)
BUN: 6 mg/dL (ref 6–20)
CO2: 37 mmol/L — ABNORMAL HIGH (ref 22–32)
CO2: 35 mmol/L — ABNORMAL HIGH (ref 22–32)
Calcium: 9.7 mg/dL (ref 8.9–10.3)
Calcium: 9.8 mg/dL (ref 8.9–10.3)
Chloride: 92 mmol/L — ABNORMAL LOW (ref 98–111)
Chloride: 90 mmol/L — ABNORMAL LOW (ref 98–111)
Creatinine, Ser: 0.56 mg/dL — ABNORMAL LOW (ref 0.61–1.24)
Creatinine, Ser: 0.43 mg/dL — ABNORMAL LOW (ref 0.61–1.24)
GFR, Estimated: >60 mL/min (ref >60)
GFR, Estimated: >60 mL/min (ref >60)
Glucose, Bld: 152 mg/dL — ABNORMAL HIGH (ref 70–99)
Glucose, Bld: 181 mg/dL — ABNORMAL HIGH (ref 70–99)
Potassium: 2.8 mmol/L — ABNORMAL LOW (ref 3.5–5.1)
Potassium: 3.5 mmol/L (ref 3.5–5.1)
Sodium: 139 mmol/L (ref 135–145)
Sodium: 134 mmol/L — ABNORMAL LOW (ref 135–145)

## 2021-07-24 LAB — MAGNESIUM: Magnesium: 1.8 mg/dL (ref 1.7–2.4)

## 2021-07-24 MED ORDER — FUROSEMIDE 40 MG PO TABS
40.0000 mg | ORAL_TABLET | Freq: Two times a day (BID) | ORAL | Status: DC
  Administered 2021-07-24 – 2021-07-25 (×3): 40 mg via ORAL
  Filled 2021-07-24 (×4): qty 1

## 2021-07-24 MED ORDER — POTASSIUM CHLORIDE 10 MEQ/100ML IV SOLN
10.0000 meq | INTRAVENOUS | Status: AC
  Administered 2021-07-24 (×4): 10 meq via INTRAVENOUS
  Filled 2021-07-24 (×4): qty 100

## 2021-07-24 MED ORDER — POTASSIUM CHLORIDE CRYS ER 20 MEQ PO TBCR
40.0000 meq | EXTENDED_RELEASE_TABLET | Freq: Once | ORAL | Status: AC
  Administered 2021-07-24: 40 meq via ORAL
  Filled 2021-07-24: qty 2

## 2021-07-24 NOTE — Consult Note (Signed)
Rheumatology follow-up Less pain in knee and shoulder Had some erythema of the ankle over the weekend and received colchicine.  Improved.  Uric acid unremarkable For discharge today.  Will either try to see him when he sees orthopedics or his wife will communicate with me after that and we will try to lower his prednisone at that time.  Same allopurinol on discharge

## 2021-07-24 NOTE — Progress Notes (Addendum)
Physical Therapy Treatment Patient Details Name: Craig [name] MRN: [id number] DOB: 07-Jun-1991 Today's Date: 07/24/2021   History of Present Illness Craig [name] is a 30 y/o M admitted on 07/08/21 after presenting to the ED with c/o SOB & intermittent AMS. Pt was c/o R knee pain & perscribed pain medication on Monday. On arrival pt was found to be hypoxic with elevated HR & during exam was noted to have seizure like activity lasting <60 seconds. Pt is being treated for septic encephalopathy. Knee cultures revealed staph aureus & septic arthritis. 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 OP Neurology did EMG study June 2022 for paresthesias bilat: "This is an abnormal electrodiagnostic study consistent with a 1) generalized severe sensorimotor polyneuropathy. 2) Superimposed left radial neuropathy 3)Can't rule out C5 radiculopathy. "    PT Comments    Pt in bed upon arrival, awake, agreeable to session. K+: this morning at 2.8, author waits until post IV replenishment before seeing patient. Pt still on O2, but rate down. Pt still tachycardic throughout as PTA. Pt has severe anxiety and dyspnea when bed is flat. Pt requires TotalA +2 for bed mobility, tolerates sitting EOB this date x6 minutes, then limited by progressive soreness pain in coathanger distribution. Pt assisted with bed level exercises supine and seated, finished up in bed with HOB at 50 degrees, Rt heel floating. Rt knee range still severely limited, but improving tolerance. BUE with proximal weakness bilat, continued left shoulder pain. Pt will still need an outpatient WC evaluation set up post DC, but it is not clear if this needs to happen after completion of home health  services.     Recommendations for follow up therapy are one component of a multi-disciplinary discharge planning process, led by the attending physician.  Recommendations may be updated based on patient status, additional functional criteria and insurance authorization.  Follow Up Recommendations  Home health PT     Assistance Recommended at Discharge Frequent or constant Supervision/Assistance  Equipment Recommendations  None recommended by PT Will need hoyer lift set up for home    Recommendations for Other Services       Precautions / Restrictions Precautions Precautions: Fall Precaution Comments: ostomy on Left; quadriparesis Restrictions RLE Weight Bearing: Weight bearing as tolerated     Mobility  Bed Mobility Overal bed mobility: Needs Assistance Bed Mobility: Supine to Sit;Sit to Supine     Supine to sit: Total assist;+2 for physical assistance;HOB elevated Sit to supine: Total assist;+2 for physical assistance        Transfers                        Ambulation/Gait                   Stairs             Wheelchair Mobility    Modified Rankin (Stroke Patients Only)       Balance   Sitting-balance support: Single extremity supported;Feet supported Sitting balance-Leahy Scale: Good Sitting balance - Comments: supervision static sitting     Standing balance-Leahy Scale: Zero                              Cognition Arousal/Alertness: Awake/alert Behavior  During Therapy: WFL for tasks assessed/performed Overall Cognitive Status: Within Functional Limits for tasks assessed                                          Exercises Total Joint Exercises Heel Slides: AROM;Both;Seated;10 reps;Limitations Heel Slides Limitations: at EOB, foot in pillowcase to allow improved movement. Knee Flexion: PROM;Right;10 reps;Supine Goniometric ROM: Rt knee flexion P/ROM: 35-75 degrees; Left ankle DF -5  degrees. General Exercises - Lower Extremity Ankle Circles/Pumps Limitations: followed by 2x30sec ankle DF stretch Heel Slides: AAROM;Both;15 reps;Supine Hip ABduction/ADduction: AAROM;Supine;15 reps;Both Straight Leg Raises: AAROM;Right;10 reps Other Exercises Other Exercises: seated EOB bilat shoulder elevation 1x10 Other Exercises: unsupported sitting EOB x6 minutes    General Comments        Pertinent Vitals/Pain Pain Assessment: Faces Faces Pain Scale: Hurts whole lot    Home Living                          Prior Function            PT Goals (current goals can now be found in the care plan section)      Frequency    Min 2X/week      PT Plan Current plan remains appropriate    Co-evaluation              AM-PAC PT "6 Clicks" Mobility   Outcome Measure  Help needed turning from your back to your side while in a flat bed without using bedrails?: Total Help needed moving from lying on your back to sitting on the side of a flat bed without using bedrails?: Total Help needed moving to and from a bed to a chair (including a wheelchair)?: Total Help needed standing up from a chair using your arms (e.g., wheelchair or bedside chair)?: Total Help needed to walk in hospital room?: Total Help needed climbing 3-5 steps with a railing? : Total 6 Click Score: 6    End of Session Equipment Utilized During Treatment: Oxygen Activity Tolerance: Patient tolerated treatment well;No increased pain Patient left: with call bell/phone within reach;with family/visitor present;in bed Nurse Communication: Mobility status PT Visit Diagnosis: Unsteadiness on feet (R26.81);Muscle weakness (generalized) (M62.81);Difficulty in walking, not elsewhere classified (R26.2) Pain - Right/Left: Right Pain - part of body: Knee     Time: 1443-1540 PT Time Calculation (min) (ACUTE ONLY): 42 min  Charges:  $Therapeutic Exercise: 38-52 mins                    4:18 PM,  07/24/21 [name], PT, DPT Physical Therapist - [facility]  [phone] ([place])    Adelee Hannula C 07/24/2021, 4:13 PM

## 2021-07-24 NOTE — Progress Notes (Signed)
   Subjective: 6 Days Post-Op Procedure(s) (LRB): ARTHROSCOPY KNEE, IRRIGATION AND DEBRIDEMENT (Right) Patient reports right knee pain is mild, improving.  Objective: Vital signs in last 24 hours: Temp:  [98 F (36.7 C)-98.6 F (37 C)] 98.4 F (36.9 C) (12/05 0435) Pulse Rate:  [107-123] 114 (12/05 0439) Resp:  [18-24] 19 (12/05 0435) BP: (126-164)/(70-91) 164/91 (12/05 0435) SpO2:  [98 %-100 %] 99 % (12/05 0439)  Intake/Output from previous day: 12/04 0701 - 12/05 0700 In: 100 [IV Piggyback:100] Out: 4235 [Urine:3935; Stool:300] Intake/Output this shift: No intake/output data recorded.  Recent Labs    07/22/21 0517 07/23/21 0441 07/24/21 0630  HGB 9.2* 9.0* 9.5*   Recent Labs    07/23/21 0441 07/24/21 0630  WBC 6.5 7.9  RBC 3.17* 3.45*  HCT 30.3* 33.0*  PLT 271 310   Recent Labs    07/23/21 1838 07/24/21 0630  NA 140 139  K 3.0* 2.8*  CL 91* 92*  CO2 37* 37*  BUN <5* 5*  CREATININE 0.40* 0.56*  GLUCOSE 234* 152*  CALCIUM 9.4 9.7   No results for input(s): LABPT, INR in the last 72 hours.  EXAM General - Patient is Alert, Appropriate, and Oriented Right lower extremity - Neurovascular intact Sensation intact distally Intact pulses distally Moderate peripheral edema bilateral lower extremities. No warmth or redness to the lower extremities. Ace wrap in tact, no drainage, no pus from incision sites    Past Medical History:  Diagnosis Date   Gout    Hypertension    Rupture of bowel (HCC)     Assessment/Plan:   6 Days Post-Op Procedure(s) (LRB): ARTHROSCOPY KNEE, IRRIGATION AND DEBRIDEMENT (Right) Principal Problem:   Acute on chronic respiratory failure with hypoxemia (HCC) Active Problems:   Colostomy status (HCC)   Pressure injury of skin  Estimated body mass index is 40.74 kg/m as calculated from the following:   Height as of this encounter: 6\' 1"  (1.854 m).   Weight as of this encounter: 140.1 kg.  Continue with antibiotics per  infectious disease.  Continue with physical therapy.  Patient will need follow-up with [facility] orthopedics in 2 weeks.   Ronney Asters, PA-C [place] 07/24/2021, 8:24 AM

## 2021-07-24 NOTE — Progress Notes (Signed)
PROGRESS NOTE  Craig [name] VQX:[id number] DOB: Dec 24, 1990 DOA: 07/08/2021 PCP: [name], MD  Brief History   Craig [name] is a 30 y.o. male with significant PMH of extensive medical history including inflammatory arthritis on methotrexate and chronic prednisone at 10 mg daily, prior severe Covid-19 infection requiring prolonged hospitalization and tracheostomy (since decannulated) Dec 2021 - Feb 2022, chronic hypoxic and hypercarbic respiratory failure on 2L O2 PRN, MSSA bacteremia, MSSA pneumonia, perforated diverticulitis s/p partial colectomy with ostomy creation, morbid obesity and gout who presented to the ED with chief complaints of progressive shortness of breath. Patient is currently intubated, history mostly obtained from patient's chart and patient's wife who is currently at the bedside.  Per patient's wife, EMS was called today due to worsening shortness of breath and intermittent altered mental status.  [name] stated the patient has been complaining of right knee pain was prescribed Percocet on Monday.  She took the Percocet at Monday and noticed worsening shortness of breath with mental status change per wife.  He also takes tramadol for pain.  No reports of chest pain, nausea vomiting, abdominal pain, diaphoresis, fevers or chills, cough or any other focal neurological symptoms or deficit.  On EMS arrival patient was noted to be hypoxic with a heart rate between 140 to 150 bpm.  He was placed on 6 L to maintain SPO2 greater than 90% and transported to the ED. In the ED, pt was noted to have seizure-like activity.  Pt was intubated, and PCCM consulted for admission and further management.   Pt was transferred to hospitalist service on 07/14/21.  The patient continues to require 4 L of O2 by [place]. He is on 2L O2 at home. 2/2 blood cultures from 07/08/2021 have grown out MSSA. There is concern for sepsis in the left shoulder. An attempt was made for an MRI to  investigate the shoulder, however, the patient was unable to tolerate MRI due to dyspnea with lying down flat. The patient continues to be volume overloaded. Will continue to diurese and attempt MRI again in a couple of days when his respiratory status is improved and he has some ativan prior to the study.  Infectious disease has placed OPAT orders. PICC line will be ordered. Continue diuresis for now to maximize respiratory status.  Potassium this am is 2.8. Supplement and monitor. Will dc IV lasix and change to PO. Will recheck potassium later today.  Consultants  Infectious disease Orthopedic surgery  Procedures  None  Antibiotics   Anti-infectives (From admission, onward)    Start     Dose/Rate Route Frequency Ordered Stop   07/23/21 1600  ceFAZolin (ANCEF) IVPB 3g/100 mL premix        3 g 200 mL/hr over 30 Minutes Intravenous Every 8 hours 07/21/21 1604     07/10/21 1630  nafcillin 12 g in sodium chloride 0.9 % 500 mL continuous infusion        12 g 20.8 mL/hr over 24 Hours Intravenous Every 24 hours 07/10/21 1511 07/23/21 1759   07/10/21 1600  nafcillin 2 g in sodium chloride 0.9 % 100 mL IVPB        2 g 200 mL/hr over 30 Minutes Intravenous  Once 07/10/21 1511 07/10/21 1632   07/09/21 1000  ceFEPIme (MAXIPIME) 2 g in sodium chloride 0.9 % 100 mL IVPB  Status:  Discontinued        2 g 200 mL/hr over 30 Minutes Intravenous Every 8 hours 07/09/21 0034  07/10/21 1029   07/09/21 0600  ceFAZolin (ANCEF) IVPB 2g/100 mL premix  Status:  Discontinued        2 g 200 mL/hr over 30 Minutes Intravenous Every 8 hours 07/09/21 0311 07/10/21 1511   07/08/21 2000  vancomycin (VANCOREADY) IVPB 1500 mg/300 mL  Status:  Discontinued        1,500 mg 150 mL/hr over 120 Minutes Intravenous Every 8 hours 07/08/21 1900 07/09/21 0311   07/08/21 1830  ceFEPIme (MAXIPIME) 2 g in sodium chloride 0.9 % 100 mL IVPB  Status:  Discontinued        2 g 200 mL/hr over 30 Minutes Intravenous Every 8 hours  07/08/21 1726 07/09/21 0311   07/08/21 0945  vancomycin (VANCOCIN) IVPB 1000 mg/200 mL premix        1,000 mg 200 mL/hr over 60 Minutes Intravenous  Once 07/08/21 0935 07/08/21 1203   07/08/21 0945  ceFEPIme (MAXIPIME) 2 g in sodium chloride 0.9 % 100 mL IVPB        2 g 200 mL/hr over 30 Minutes Intravenous  Once 07/08/21 0935 07/08/21 1146   07/08/21 0945  azithromycin (ZITHROMAX) 500 mg in sodium chloride 0.9 % 250 mL IVPB        500 mg 250 mL/hr over 60 Minutes Intravenous  Once 07/08/21 0935 07/08/21 1420        Subjective  The patient is resting in bed. Wife is at bedside. No new complaints.  Objective   Vitals:  Vitals:   07/24/21 0826 07/24/21 1249  BP: 139/89 (!) 145/80  Pulse: (!) 124 (!) 117  Resp: 18 16  Temp: 98.3 F (36.8 C) 98.8 F (37.1 C)  SpO2:  100%   Exam:  Constitutional:  The patient is awake, alert, and oriented x 3. No acute ditress Mild conversational dyspnea Lung sounds are distant No wheezes or rhonchi Scattered rales No tactile fremitus Cardiovascular:  Regular rate and rhythm No murmurs, ectopy, or gallups. No lateral PMI. No thrills. Abdomen:  Abdomen is soft, non-tender, non-distended Morbidly obese No hernias, masses, or organomegaly Normoactive bowel sounds.  Musculoskeletal:  No cyanosis, clubbing Trace pitting edema of lower extremities bilaterally Skin:  No rashes, lesions, ulcers palpation of skin: no induration or nodules Neurologic:  CN 2-12 intact Sensation all 4 extremities intact Psychiatric:  Mental status Mood, affect appropriate Orientation to person, place, time  judgment and insight appear intact  I have personally reviewed the following:   Today's Data  Vitals  Lab Data  BMP, CBC  Micro Data  2/2 blood cultures from 07/08/2021 have grown out MSSA.   Imaging  None  Cardiology Data  EKG  Scheduled Meds:  acetaZOLAMIDE  250 mg Oral BID   allopurinol  300 mg Oral Daily   vitamin C  500 mg  Oral BID   chlorhexidine gluconate (MEDLINE KIT)  15 mL Mouth Rinse BID   Chlorhexidine Gluconate Cloth  6 each Topical Q0600   colchicine  0.6 mg Oral BID   COVID-19 mRNA bivalent vaccine (Moderna)  0.5 mL Intramuscular Once   DULoxetine  60 mg Oral Daily   enoxaparin (LOVENOX) injection  0.5 mg/kg Subcutaneous Q24H   famotidine  20 mg Oral BID   feeding supplement  1 Container Oral TID BM   folic acid  1 mg Oral Daily   furosemide  40 mg Oral BID   insulin aspart  0-20 Units Subcutaneous TID WC   insulin aspart  10 Units Subcutaneous TID WC  insulin glargine-yfgn  38 Units Subcutaneous BID   lidocaine  1 patch Transdermal Q24H   metoprolol succinate  100 mg Oral Daily   multivitamin with minerals  1 tablet Oral Daily   nicotine  21 mg Transdermal Daily   nystatin   Topical TID   predniSONE  5 mg Oral Q breakfast   pregabalin  200 mg Oral BID   sodium chloride flush  10-40 mL Intracatheter Q12H   sodium chloride flush  10-40 mL Intracatheter Q12H   Continuous Infusions:  sodium chloride Stopped (07/22/21 0126)    ceFAZolin (ANCEF) IV 3 g (07/24/21 1405)   potassium chloride 10 mEq (07/24/21 1457)    Principal Problem:   Acute on chronic respiratory failure with hypoxemia (HCC) Active Problems:   Colostomy status (HCC)   Pressure injury of skin   LOS: 16 days   A & P  Acute on chronic hypoxic and hypercarbic respiratory failure On chronic 2L home O2 - Extubated 11/23.  Currently on 3L [place], required bipap overnight --Continue supplemental O2 to keep sats >=90%, wean as tolerated. The patient is currently on 3L to maintain saturations of 100%. Wean as possible.    Sepsis 2/2  MSSA bacteremia  Right septic joint s/p I/D on 07/11/21 and again on 07/18/21 -Infectious Disease consulted -s/p TEE - no veg -Repeat blood cultures neg. --purulent drainage noted from the knee wound so pt was taken to OR for 2nd I/D. Plan: --cont nafcillin, Will need 6 weeks of IV antibiotic.   Before discharge will change to cefazolin, per ID. --pain control for right knee --cont Hemovac, to be managed by ortho - PICC line has been placed - Per infectious disease the patient will require cefazolin 3 gm IV Q8 hr x 4 weeks. End date will be 08/10/2021.   Hypokalemia: Supplement and monitor.  Left shoulder pain --Xray of left shoulder negative --MRI of left shoulder to rule out septic joint could not be done due to patient's dyspnea with lying flat.   Shock -RESOLVED  - Favor sedation/analgesia related hypotension rather than septic. --s/p pressors --cont home prednisone (for rheumatoid arthritis); no indication for stress dose steroids at this time   Acute toxic metabolic encephalopathy, resolved --2/2 sepsis -CT Head wnl   Seizure Like Activity- RESOLVED -CT Head wnl -Loaded with Keppra 1500 mg x 1 -Stopped Keppra; monitor for recurrence of seizure activity, per neuro   H/o upper extremity DVT It appears that he was started on Xarelto before around Feb/Mar 2022 for provoked upper extremity DVT. He has completed an appropriate course of anticoagulation at this time and therapeutic anticoagulation will be discontinued. CTA chest did not reveal PE on admission.   DM2, well controlled --A1c 6.8 --increase glargine to 24u BID --cont mealtime 10u TID --SSI TID   Probable OSA --look into getting pt qualified for CPAP --CPAP nightly while inpatient   Anasarca --averages about 4L urine output per 1 dose of IV lasix 40 Plan: --cont IV lasix 60 mg BID --strict I/O  Contraction alkalosis, improved --improved after starting diamox --cont diamox for now   Hypoalbuminemia  --albumin 2.6 --pt refused nepro, supplement changed to clear boost --cont clear boost TID (however, dieticians keep changing it back to Nepro).    Complicated diverticulitis , perforation, s/p colectomy and colostomy --monitor output --change scheduled Miralax and Colace to PRN due to watery  output    I have seen and examined this patient myself. I have spent 32 minutes in his evaluation and  care.   DVT prophylaxis: Lovenox SQ Code Status: Full code  Family Communication: mother updated at bedside today   Level of care: Telemetry Surgical Dispo:   The patient is from: home Anticipated d/c is to: home with [facility] and DME, pt and wife refused SNF  Weltha Cathy, DO [facility] Direct contact: see [email or web address]  7PM-7AM contact night coverage as above 07/24/2021, 3:56 PM  LOS: 12 days

## 2021-07-25 DIAGNOSIS — J9621 Acute and chronic respiratory failure with hypoxia: Secondary | ICD-10-CM | POA: Diagnosis not present

## 2021-07-25 LAB — BASIC METABOLIC PANEL
Anion gap: 5 (ref 5–15)
Anion gap: 9 (ref 5–15)
BUN: 5 mg/dL — ABNORMAL LOW (ref 6–20)
BUN: 6 mg/dL (ref 6–20)
CO2: 38 mmol/L — ABNORMAL HIGH (ref 22–32)
CO2: 35 mmol/L — ABNORMAL HIGH (ref 22–32)
Calcium: 9.7 mg/dL (ref 8.9–10.3)
Calcium: 9.3 mg/dL (ref 8.9–10.3)
Chloride: 94 mmol/L — ABNORMAL LOW (ref 98–111)
Chloride: 93 mmol/L — ABNORMAL LOW (ref 98–111)
Creatinine, Ser: 0.4 mg/dL — ABNORMAL LOW (ref 0.61–1.24)
Creatinine, Ser: 0.39 mg/dL — ABNORMAL LOW (ref 0.61–1.24)
GFR, Estimated: >60 mL/min (ref >60)
GFR, Estimated: >60 mL/min (ref >60)
Glucose, Bld: 151 mg/dL — ABNORMAL HIGH (ref 70–99)
Glucose, Bld: 125 mg/dL — ABNORMAL HIGH (ref 70–99)
Potassium: 3 mmol/L — ABNORMAL LOW (ref 3.5–5.1)
Potassium: 3.5 mmol/L (ref 3.5–5.1)
Sodium: 137 mmol/L (ref 135–145)
Sodium: 137 mmol/L (ref 135–145)

## 2021-07-25 LAB — GLUCOSE, CAPILLARY
Glucose-Capillary: 187 mg/dL — ABNORMAL HIGH (ref 70–99)
Glucose-Capillary: 169 mg/dL — ABNORMAL HIGH (ref 70–99)
Glucose-Capillary: 269 mg/dL — ABNORMAL HIGH (ref 70–99)

## 2021-07-25 LAB — MAGNESIUM: Magnesium: 1.7 mg/dL (ref 1.7–2.4)

## 2021-07-25 MED ORDER — OXYCODONE HCL 10 MG PO TABS: 5.0000 mg | ORAL_TABLET | Freq: Four times a day (QID) | ORAL | 0 refills | Status: DC | PRN

## 2021-07-25 MED ORDER — PREDNISONE 5 MG PO TABS: 5.0000 mg | ORAL_TABLET | Freq: Every day | ORAL | 0 refills | Status: DC

## 2021-07-25 MED ORDER — POLYETHYLENE GLYCOL 3350 17 G PO PACK: 17.0000 g | PACK | Freq: Every day | ORAL | 0 refills | Status: AC

## 2021-07-25 MED ORDER — POTASSIUM CHLORIDE ER 20 MEQ PO TBCR
10.0000 meq | EXTENDED_RELEASE_TABLET | Freq: Two times a day (BID) | ORAL | 0 refills | Status: DC
Start: 2021-07-25 — End: 2021-08-23

## 2021-07-25 MED ORDER — ASCORBIC ACID 500 MG PO TABS
500.0000 mg | ORAL_TABLET | Freq: Two times a day (BID) | ORAL | 0 refills | Status: DC
Start: 2021-07-25 — End: 2022-03-26

## 2021-07-25 MED ORDER — CEFAZOLIN IV (FOR PTA / DISCHARGE USE ONLY): 3.0000 g | Freq: Three times a day (TID) | INTRAVENOUS | 0 refills | Status: DC

## 2021-07-25 MED ORDER — INSULIN ASPART 100 UNIT/ML IJ SOLN: 10.0000 [IU] | Freq: Three times a day (TID) | INTRAMUSCULAR | 11 refills | Status: DC

## 2021-07-25 MED ORDER — POTASSIUM CHLORIDE CRYS ER 20 MEQ PO TBCR
40.0000 meq | EXTENDED_RELEASE_TABLET | Freq: Once | ORAL | Status: AC
  Administered 2021-07-25: 40 meq via ORAL

## 2021-07-25 MED ORDER — POTASSIUM CHLORIDE CRYS ER 20 MEQ PO TBCR
40.0000 meq | EXTENDED_RELEASE_TABLET | ORAL | Status: AC
  Administered 2021-07-25 (×2): 40 meq via ORAL
  Filled 2021-07-25 (×2): qty 2

## 2021-07-25 MED ORDER — ACETAZOLAMIDE 250 MG PO TABS
250.0000 mg | ORAL_TABLET | Freq: Two times a day (BID) | ORAL | 0 refills | Status: DC
Start: 2021-07-25 — End: 2021-08-28

## 2021-07-25 MED ORDER — FUROSEMIDE 40 MG PO TABS
40.0000 mg | ORAL_TABLET | Freq: Two times a day (BID) | ORAL | 0 refills | Status: DC
Start: 2021-07-25 — End: 2022-03-26

## 2021-07-25 MED ORDER — NICOTINE 21 MG/24HR TD PT24: 21.0000 mg | MEDICATED_PATCH | Freq: Every day | TRANSDERMAL | 0 refills | Status: AC

## 2021-07-25 MED ORDER — NYSTATIN 100000 UNIT/GM EX POWD: Freq: Three times a day (TID) | CUTANEOUS | 0 refills | Status: DC

## 2021-07-25 MED ORDER — INSULIN GLARGINE-YFGN 100 UNIT/ML ~~LOC~~ SOLN: 38.0000 [IU] | Freq: Two times a day (BID) | SUBCUTANEOUS | 11 refills | Status: DC

## 2021-07-25 NOTE — Progress Notes (Signed)
Bs 269 @ 12:02pm

## 2021-07-25 NOTE — Discharge Summary (Signed)
Physician Discharge Summary  Craig [name] SJG:[id number] DOB: 1990-11-17 DOA: 07/08/2021  PCP: [name], MD  Admit date: 07/08/2021 Discharge date: 07/25/2021  Recommendations for Outpatient Follow-up:  Discharge to home with home health PT/OT, RN, Home health aide, SW, O2 2L by [place] continuous and IV antibiotics. Last dose 08/10/2021. Follow up with PCP in 7-10 days. Have chemistry checked on that date. Follow up with Dr. Jefm Bryant in 1-2 weeks. Follow up with infectious disease as directed.  Discharge Diagnoses: Principal diagnosis is #1 Acute on chronic hypoxic and hypercarbic respiratory failure Sepsis MSSA Bacteremia Rt septic shoulder  Hypokalemia Left shoulder pain  Shock Acute toxic metabolic encephalopathy Seizure-like activity History of upper extremity DVT DM II Likely OSA Anasarca Contraction alkolosis Hypoalbuminemia Complicated diverticulitis/perforation s/p colectomy and colostomy  Discharge Condition: Fair  Disposition: Home with home health  Diet recommendation: Heart healthy/carbohydrate modified  Filed Weights   07/22/21 0626 07/23/21 0340 07/25/21 0500  Weight: (!) 138.2 kg (!) 140.1 kg (!) 141.2 kg    History of present illness:  Craig [name] is a 30 y.o. 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 CTA chest demonstrated L>R sided opacities with air bronchograms in the anterior RUL; no PE identified. CT abdomen showed no acute process. Apparent witnessed seizure activity in the ED s/p Keppra load. He has no  prior history of seizure disorder. Post-intubation ABG shows chronic hypercarbia with probable metabolic alkalosis on top of chronic compensation for chronic respiratory acidosis.   Blood pressure (!) 144/83, pulse (!) 115, temperature (!) 100.5 F (38.1 C), temperature source Axillary, resp. rate 12, height _0  (1.854 m), weight (!) 145 kg, SpO2 98 %.   No intake or output data in the 24 hours ending 07/08/21 1733   Vent Mode: PRVC FiO2 (%):  [45 %-100 %] 45 % Set Rate:  [18 bmp] 18 bmp Vt Set:  [500 mL-550 mL] 500 mL PEEP:  [5 cmH20-8 cmH20] 8 cmH20   On exam he is intubated and sedated. Pupils 4 mm, equal and reactive. Lungs are clear in the anterior fields on my exam. Cardiac exam wnl. Diffuse anasarca. Ostomy noted. Reddening of skin on face, torso and legs with macular rash on torso; this is apparently normal per the patient's wife. Blistering on bilateral feet without active skin sloughing.  Hospital Course:  Craig [name] is a 30 y.o. male with significant PMH of extensive medical history including inflammatory arthritis on methotrexate and chronic prednisone at 10 mg daily, prior severe Covid-19 infection requiring prolonged hospitalization and tracheostomy (since decannulated) Dec 2021 - Feb 2022, chronic hypoxic and hypercarbic respiratory failure on 2L O2 PRN, MSSA bacteremia, MSSA pneumonia, perforated diverticulitis s/p partial colectomy with ostomy creation, morbid obesity and gout who presented to the ED with chief complaints of progressive shortness of breath. Patient is currently intubated, history mostly obtained from patient's chart and patient's wife who is currently at the bedside.  Per patient's wife, EMS was called today due to worsening shortness of breath and intermittent altered mental status.  [name] stated the patient has been complaining of right knee pain was prescribed Percocet on Monday.  She took the  Percocet at Monday and noticed worsening  shortness of breath with mental status change per wife.  He also takes tramadol for pain.  No reports of chest pain, nausea vomiting, abdominal pain, diaphoresis, fevers or chills, cough or any other focal neurological symptoms or deficit.  On EMS arrival patient was noted to be hypoxic with a heart rate between 140 to 150 bpm.  He was placed on 6 L to maintain SPO2 greater than 90% and transported to the ED. In the ED, pt was noted to have seizure-like activity.  Pt was intubated, and PCCM consulted for admission and further management.   Pt was transferred to hospitalist service on 07/14/21.   The patient continues to require 4 L of O2 by [place]. He is on 2L O2 at home. 2/2 blood cultures from 07/08/2021 have grown out MSSA. There is concern for sepsis in the left shoulder. An attempt was made for an MRI to investigate the shoulder, however, the patient was unable to tolerate MRI due to dyspnea with lying down flat. The patient continues to be volume overloaded. Will continue to diurese and attempt MRI again in a couple of days when his respiratory status is improved and he has some ativan prior to the study.   Infectious disease has placed OPAT orders. PICC line will be ordered. Continue diuresis for now to maximize respiratory status.   Potassium this am is 3.0. Supplement now and recheck potassium later today. If adequately repleted, plan to discharge to home.     Today's assessment: S: The patient is resting comfortably. No new complaints. O: Vitals:  Vitals:   07/25/21 1157 07/25/21 1522  BP: 122/67 131/71  Pulse: (!) 111 (!) 105  Resp: 16 17  Temp: 99.3 F (37.4 C) 98.5 F (36.9 C)  SpO2: 97% 95%   Exam:   Constitutional:  The patient is awake, alert, and oriented x 3. No acute distress No wheezes or rhonchi No tactile fremitus Cardiovascular:  Regular rate and rhythm No murmurs, ectopy, or gallups. No lateral PMI. No thrills. Abdomen:  Abdomen is soft, non-tender,  non-distended Morbidly obese No hernias, masses, or organomegaly Normoactive bowel sounds.  Musculoskeletal:  No cyanosis, clubbing Trace pitting edema of lower extremities bilaterally Skin:  No rashes, lesions, ulcers palpation of skin: no induration or nodules Neurologic:  CN 2-12 intact Sensation all 4 extremities intact Psychiatric:  Mental status Mood, affect appropriate Orientation to person, place, time  judgment and insight appear intact  Discharge Instructions  Discharge Instructions     Activity as tolerated - No restrictions   Complete by: As directed    [facility] pharmacist to adjust dose for Vancomycin, Aminoglycosides and other anti-infective therapies as requested by physician.   Complete by: As directed    [facility] to provide Cath Flo 88m   Complete by: As directed    Administer for PICC line occlusion and as ordered by physician for other access device issues.   Anaphylaxis Kit: Provided to treat any anaphylactic reaction to the medication being provided to the patient if First Dose or when requested by physician   Complete by: As directed    Epinephrine 136mml vial / amp: Administer 0.58m36m0.58ml9mubcutaneously once for moderate to severe anaphylaxis, nurse to call physician and pharmacy when reaction occurs and call 911 if needed for immediate care   Diphenhydramine 50mg35mIV vial: Administer 25-50mg 58mM PRN for first dose reaction, rash, itching, mild reaction, nurse to call physician and pharmacy when  reaction occurs   Sodium Chloride 0.9% NS 530m IV: Administer if needed for hypovolemic blood pressure drop or as ordered by physician after call to physician with anaphylactic reaction   Call MD for:  persistant nausea and vomiting   Complete by: As directed    Call MD for:  severe uncontrolled pain   Complete by: As directed    Change dressing on IV access line weekly and PRN   Complete by: As directed    Diet - low sodium  heart healthy   Complete by: As directed    Discharge instructions   Complete by: As directed    Discharge to home with home health PT/OT, RN, Home health aide, SW, O2 2L by [place] continuous and IV antibiotics. Last dose 08/10/2021. Follow up with PCP in 7-10 days. Have chemistry checked on that date. Follow up with Dr. KJefm Bryantin 1-2 weeks. Follow up with infectious disease as directed.   Flush IV access with Sodium Chloride 0.9% and Heparin 10 units/ml or 100 units/ml   Complete by: As directed    For home use only DME oxygen   Complete by: As directed    Length of Need: Lifetime   Liters per Minute: 2   Frequency: Continuous (stationary and portable oxygen unit needed)   Oxygen conserving device: Yes   Oxygen delivery system: Gas   Home infusion instructions - [facility]   Complete by: As directed    Instructions: Flush IV access with Sodium Chloride 0.9% and Heparin 10units/ml or 100units/ml   Change dressing on IV access line: Weekly and PRN   Instructions Cath Flo 264m Administer for PICC Line occlusion and as ordered by physician for other access device   [facility] pharmacist to adjust dose for: Vancomycin, Aminoglycosides and other anti-infective therapies as requested by physician   Increase activity slowly   Complete by: As directed    Method of administration may be changed at the discretion of home infusion pharmacist based upon assessment of the patient and/or caregiver's ability to self-administer the medication ordered   Complete by: As directed    No wound care   Complete by: As directed       Allergies as of 07/25/2021       Reactions   Amoxicillin Rash   Tolerated cefepime and cefazolin 08/2020.        Medication List     STOP taking these medications    doxazosin 1 MG tablet Commonly known as: CARDURA   doxycycline 100 MG capsule Commonly known as: VIBRAMYCIN   fluticasone-salmeterol 100-50 MCG/ACT Aepb Commonly known as:  ADVAIR   HYDROcodone-acetaminophen 5-325 MG tablet Commonly known as: NORCO/VICODIN   ibuprofen 600 MG tablet Commonly known as: ADVIL   lisinopril 5 MG tablet Commonly known as: ZESTRIL   methotrexate 2.5 MG tablet Commonly known as: RHEUMATREX   OVER THE COUNTER MEDICATION   OXYGEN   traMADol 50 MG tablet Commonly known as: ULTRAM       TAKE these medications    acetaminophen 650 MG CR tablet Commonly known as: TYLENOL Take by mouth.   acetaZOLAMIDE 250 MG tablet Commonly known as: DIAMOX Take 1 tablet (250 mg total) by mouth 2 (two) times daily.   albuterol 1.25 MG/3ML nebulizer solution Commonly known as: ACCUNEB Take by nebulization.   allopurinol 300 MG tablet Commonly known as: ZYLOPRIM Take 300 mg by mouth daily.   ascorbic acid 500 MG tablet Commonly known as: VITAMIN C Take 1  tablet (500 mg total) by mouth 2 (two) times daily.   budesonide 0.25 MG/2ML nebulizer solution Commonly known as: PULMICORT Inhale into the lungs.   ceFAZolin  IVPB Commonly known as: ANCEF Inject 3 g into the vein every 8 (eight) hours for 20 days. Indication:  MSSA bacteremia and septic knee First Dose: Yes Last Day of Therapy:  08/10/2021 Labs - Once weekly:  CBC/D, CMP,ESR and CRP Please pull PIC at completion of IV antibiotics Fax weekly labs to Dr.[phone] Method of administration: IV Push Method of administration may be changed at the discretion of home infusion pharmacist based upon assessment of the patient and/or caregiver's ability to self-administer the medication ordered.   cetirizine 10 MG tablet Commonly known as: ZYRTEC Take 10 mg by mouth daily.   clonazePAM 1 MG tablet Commonly known as: KLONOPIN Take by mouth.   [facility] Saline Nasal Spray 0.65 % nasal spray Generic drug: sodium chloride SMARTSIG:1 Spray(s) Both Nares Every 4 Hours PRN   cyanocobalamin 1000 MCG tablet Take 1,000 mcg by mouth daily.   VITAMIN B-12 IJ Inject as  directed.   DULoxetine 60 MG capsule Commonly known as: CYMBALTA Take by mouth.   famotidine 20 MG tablet Commonly known as: PEPCID Take by mouth.   Fish Oil 1000 MG Caps Take by mouth.   folic acid 1 MG tablet Commonly known as: FOLVITE Take by mouth.   furosemide 40 MG tablet Commonly known as: LASIX Take 1 tablet (40 mg total) by mouth 2 (two) times daily. What changed:  medication strength when to take this additional instructions   insulin aspart 100 UNIT/ML injection Commonly known as: novoLOG Inject 10 Units into the skin 3 (three) times daily with meals.   insulin glargine-yfgn 100 UNIT/ML injection Commonly known as: SEMGLEE Inject 0.38 mLs (38 Units total) into the skin 2 (two) times daily.   lidocaine 5 % Commonly known as: LIDODERM 1 patch daily as needed.   melatonin 3 MG Tabs tablet Take 6 mg by mouth at bedtime as needed.   metoprolol succinate 100 MG 24 hr tablet Commonly known as: TOPROL-XL Take by mouth.   multivitamin with minerals tablet Take 1 tablet by mouth daily.   nicotine 21 mg/24hr patch Commonly known as: NICODERM CQ - dosed in mg/24 hours Place 1 patch (21 mg total) onto the skin daily. Start taking on: July 26, 2021   nystatin powder Commonly known as: MYCOSTATIN/NYSTOP Apply topically 3 (three) times daily.   ondansetron 4 MG tablet Commonly known as: ZOFRAN Take by mouth.   Oxycodone HCl 10 MG Tabs Take 0.5-1 tablets (5-10 mg total) by mouth every 6 (six) hours as needed for moderate pain or severe pain.   polyethylene glycol 17 g packet Commonly known as: MIRALAX / GLYCOLAX Take 17 g by mouth daily.   Potassium Chloride ER 20 MEQ Tbcr Take 10 mEq by mouth 2 (two) times daily. What changed:  medication strength when to take this   predniSONE 5 MG tablet Commonly known as: DELTASONE Take 1 tablet (5 mg total) by mouth daily with breakfast. Start taking on: July 26, 2021 What changed:  See the new  instructions. Another medication with the same name was removed. Continue taking this medication, and follow the directions you see here.   pregabalin 200 MG capsule Commonly known as: LYRICA Take by mouth.   rivaroxaban 20 MG Tabs tablet Commonly known as: XARELTO Take by mouth.   Vitamin D3 25 MCG (1000 UT) Caps Take  by mouth.               Durable Medical Equipment  (From admission, onward)           Start     Ordered   07/25/21 0000  For home use only DME oxygen       Question Answer Comment  Length of Need Lifetime   Liters per Minute 2   Frequency Continuous (stationary and portable oxygen unit needed)   Oxygen conserving device Yes   Oxygen delivery system Gas      07/25/21 1548   07/21/21 1649  For home use only DME wheelchair cushion (seat and back)  Once        07/21/21 1649   07/21/21 1303  For home use only DME standard manual wheelchair with seat cushion  Once       Comments: Bariatric Wheelchair  Patient suffers from non weight bearing and weakness which impairs their ability to perform daily activities like ADLS in the home.  A walking aide will not resolve issue with performing activities of daily living. A wheelchair will allow patient to safely perform daily activities. Patient can safely propel the wheelchair in the home or has a caregiver who can provide assistance. Length of need 12 months. Accessories: elevating leg rests (ELRs), wheel locks, extensions and anti-tippers, back cushion   07/21/21 1303   07/18/21 1539  For home use only DME Other see comment  Once       Comments: O2 Humidity  Question:  Length of Need  Answer:  Lifetime   07/18/21 1540   07/18/21 1354  For home use only DME Other see comment  Once       Comments: Harrel Lemon lift 309.32 Lbs  Question:  Length of Need  Answer:  Lifetime   07/18/21 1355              Discharge Care Instructions  (From admission, onward)           Start     Ordered   07/25/21 0000   Change dressing on IV access line weekly and PRN  (Home infusion instructions - [facility] )        07/25/21 1548           Allergies  Allergen Reactions   Amoxicillin Rash    Tolerated cefepime and cefazolin 08/2020.    The results of significant diagnostics from this hospitalization (including imaging, microbiology, ancillary and laboratory) are listed below for reference.    Significant Diagnostic Studies: DG Abdomen 1 View  Result Date: 07/08/2021 CLINICAL DATA:  OG tube placement EXAM: ABDOMEN - 1 VIEW COMPARISON:  09/09/2020 FINDINGS: Limited radiograph of the lower chest and upper abdomen was obtained for the purposes of enteric tube localization. Enteric tube is seen coursing below the diaphragm with distal tip and side port terminating within the expected location of the gastric body. Excreted contrast seen within the bilateral kidneys. IMPRESSION: Enteric tube tip and side port project within the expected location of the gastric body. Electronically Signed   By: Davina Poke D.O.   On: 07/08/2021 15:09   CT HEAD WO CONTRAST (5MM)  Result Date: 07/08/2021 CLINICAL DATA:  Seizure, abnormal neuro exam EXAM: CT HEAD WITHOUT CONTRAST TECHNIQUE: Contiguous axial images were obtained from the base of the skull through the vertex without intravenous contrast. COMPARISON:  CT head 10/28/2020, brain MRI 08/25/2020 FINDINGS: Brain: There is no acute intracranial hemorrhage, extra-axial fluid collection, or acute  infarct. Parenchymal volume is normal. The ventricles are normal in size. There is no mass lesion. There is no midline shift. Vascular: No hyperdense vessel or unexpected calcification. Skull: Normal. Negative for fracture or focal lesion. Sinuses/Orbits: There is mild mucosal thickening in the paranasal sinuses. There is unchanged bilateral proptosis. Other: Endotracheal and enteric catheters are noted. IMPRESSION: No acute intracranial pathology or epileptogenic focus  identified. Electronically Signed   By: Valetta Mole M.D.   On: 07/08/2021 17:22   CT Angio Chest PE W and/or Wo Contrast  Result Date: 07/08/2021 CLINICAL DATA:  Sepsis EXAM: CT ANGIOGRAPHY CHEST WITH CONTRAST TECHNIQUE: Multidetector CT imaging of the chest was performed using the standard protocol during bolus administration of intravenous contrast. Multiplanar CT image reconstructions and MIPs were obtained to evaluate the vascular anatomy. CONTRAST:  1107m OMNIPAQUE IOHEXOL 350 MG/ML SOLN COMPARISON:  CT chest 10/12/2020 FINDINGS: Limited study due to motion. Cardiovascular: No large or central pulmonary embolism identified. Limited evaluation of segmental and subsegmental branches due to motion. Main pulmonary artery is normal caliber. Heart size is upper normal. No pericardial effusion identified. Thoracic aorta is normal in course and caliber. Mediastinum/Nodes: No bulky axillary, hilar or mediastinal lymphadenopathy identified. Lungs/Pleura: There is similar distribution and appearance of irregular and consolidative/atelectatic densities bilaterally which are mostly perihilar on the right and in the upper lobe on the left. No definite new consolidation identified. No pleural effusion or pneumothorax. Upper Abdomen: Hepatomegaly and hepatic steatosis. Elevated right hemidiaphragm. Musculoskeletal: No suspicious bony lesions identified. Review of the MIP images confirms the above findings. IMPRESSION: 1. No pulmonary embolism identified. Somewhat limited evaluation due to motion. 2. No acute consolidations/infiltrate identified in the lungs. Similar appearance and distribution of irregular and consolidative densities bilaterally. 3. Hepatomegaly and hepatic steatosis. Electronically Signed   By: DOfilia NeasM.D.   On: 07/08/2021 14:18   CT ABDOMEN PELVIS W CONTRAST  Result Date: 07/08/2021 CLINICAL DATA:  Abdominal abscess/infection EXAM: CT ABDOMEN AND PELVIS WITH CONTRAST TECHNIQUE:  Multidetector CT imaging of the abdomen and pelvis was performed using the standard protocol following bolus administration of intravenous contrast. CONTRAST:  1023mOMNIPAQUE IOHEXOL 350 MG/ML SOLN COMPARISON:  CT abdomen and pelvis 10/12/2020 FINDINGS: Somewhat limited due to motion. Lower chest: Breathing motion and irregular densities identified in the bilateral lower lungs. Hepatobiliary: Liver is enlarged measuring 25 cm in length with mild lobulated contour and hypodense parenchyma. Gallbladder appears grossly normal. No biliary ductal dilatation identified. Pancreas: Unremarkable. No pancreatic ductal dilatation or surrounding inflammatory changes. Spleen: Normal in size without focal abnormality. Adrenals/Urinary Tract: Adrenal glands are unremarkable. Kidneys are normal, without renal calculi, focal lesion, or hydronephrosis. Bladder is unremarkable. Stomach/Bowel: No bowel obstruction, free air or pneumatosis. Previous partial left colectomy surgical changes with a left-sided colostomy. No bowel wall edema identified. Appendix appears normal. Vascular/Lymphatic: No significant vascular findings are present. No enlarged abdominal or pelvic lymph nodes. Reproductive: Prostate is unremarkable. Other: Rectus diastasis.  No ascites. 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 No suspicious bony lesions identified. IMPRESSION: 1. No acute process identified in the abdomen or pelvis. 2. Hepatomegaly and steatosis. 3. Other chronic findings as described. Electronically Signed   By: DeOfilia Neas.D.   On: 07/08/2021 14:24   DG Chest Port 1 View  Result Date: 07/21/2021 CLINICAL DATA:  History of prior COVID infection with persistent shortness of breath, initial encounter EXAM: PORTABLE CHEST 1 VIEW COMPARISON:  07/13/2021 FINDINGS: Cardiac shadow remains enlarged. Elevation of the right  hemidiaphragm is again seen. Stable airspace opacities are noted bilaterally similar to  that seen on the prior exam. No new focal abnormality is noted. No bony abnormality is seen. IMPRESSION: Stable airspace opacities which may be sequelae from the prior COVID infection. Electronically Signed   By: Inez Catalina M.D.   On: 07/21/2021 19:20   DG Chest Port 1 View  Result Date: 07/13/2021 CLINICAL DATA:  Shortness of breath.  Recent RIGHT knee surgery. EXAM: PORTABLE CHEST 1 VIEW COMPARISON:  07/11/2021 and prior studies FINDINGS: An endotracheal tube has been removed. The cardiomediastinal silhouette is unchanged. RIGHT hemidiaphragm elevation is again noted. Bilateral airspace opacities are again noted with slightly increased RIGHT basilar opacity/atelectasis. There is no evidence of pneumothorax or large pleural effusion. IMPRESSION: 1. Endotracheal tube removal with slightly increased RIGHT basilar opacity/atelectasis. 2. Bilateral airspace opacities again noted. Electronically Signed   By: Margarette Canada M.D.   On: 07/13/2021 10:57   DG Chest Port 1 View  Result Date: 07/11/2021 CLINICAL DATA:  Encounter for intubation Z01.818 (ICD-10-CM) EXAM: PORTABLE CHEST 1 VIEW COMPARISON:  July 08, 2021. FINDINGS: Endotracheal tube tip at the level of the clavicular heads. Similar enlargement of the cardiomediastinal silhouette, likely also accentuated by AP technique. Persistent airspace opacities bilaterally. No visible pleural effusions or pneumothorax on this semi erect radiograph. IMPRESSION: 1. Endotracheal tube in good position with tip at the level of the clavicular heads. 2. Persistent bilateral airspace opacities, unchanged. Electronically Signed   By: Margaretha Sheffield M.D.   On: 07/11/2021 17:24   DG Chest Portable 1 View  Result Date: 07/08/2021 CLINICAL DATA:  Intubation EXAM: PORTABLE CHEST 1 VIEW COMPARISON:  07/08/2021 at 0834 hours FINDINGS: Interval placement of endotracheal tube with distal tip terminating 4.7 cm above the carina. Interval placement of enteric tube with  distal tip extending beyond the inferior margin of the film. Heart size appears mildly enlarged, unchanged. Persistent airspace opacities bilaterally, unchanged. No pleural effusion or pneumothorax. IMPRESSION: 1. Interval placement of endotracheal and enteric tubes in appropriate position. 2. Persistent bilateral airspace opacities, unchanged. Electronically Signed   By: Davina Poke D.O.   On: 07/08/2021 15:08   DG Chest Port 1 View  Result Date: 07/08/2021 CLINICAL DATA:  Sepsis EXAM: PORTABLE CHEST 1 VIEW COMPARISON:  Chest x-ray 09/17/2020 FINDINGS: Interval removal of the tracheostomy tube. Cardiomediastinal silhouette appears grossly unchanged. Heart size is upper normal. Interval improved aeration of the lungs with some persistent irregular opacities in the right perihilar region and scattered in the left lung. No large pleural effusion visualized. No pneumothorax. IMPRESSION: Mild irregular opacities bilaterally which may be residual/chronic. Improved aeration of the lungs overall since previous study. Electronically Signed   By: Ofilia Neas M.D.   On: 07/08/2021 09:31   DG Shoulder Left  Result Date: 07/17/2021 CLINICAL DATA:  Left shoulder pain, no injury. EXAM: LEFT SHOULDER - 2+ VIEW COMPARISON:  None. FINDINGS: Mild degenerative changes in the left acromioclavicular joint. No acute osseous or joint abnormality. IMPRESSION: Mild left acromioclavicular joint osteoarthritis. Electronically Signed   By: Lorin Picket M.D.   On: 07/17/2021 16:54   [place] LT UPPER EXTREM LTD SOFT TISSUE NON VASCULAR  Result Date: 07/20/2021 CLINICAL DATA:  30 year old male with possible left shoulder septic arthritis. EXAM: ULTRASOUND LEFT UPPER EXTREMITY LIMITED TECHNIQUE: Ultrasound examination of the upper extremity soft tissues was performed in the area of clinical concern. COMPARISON:  None. FINDINGS: Ultrasound evaluation of the left shoulder glenohumeral joint demonstrates no evidence of  significant joint effusion. IMPRESSION: No sonographic evidence of left shoulder effusion. Electronically Signed   By: Ruthann Cancer M.D.   On: 07/20/2021 16:37   ECHO TEE  Result Date: 07/12/2021    TRANSESOPHOGEAL ECHO REPORT   Patient Name:   Craig [name] Date of Exam: 07/12/2021 Medical Rec #:  [id number]             Height:       73.0 in Accession #:    [id number]            Weight:       319.9 lb Date of Birth:  30-Dec-1990              BSA:          2.627 m Patient Age:    36 years              BP:           92/44 mmHg Patient Gender: M                     HR:           93 bpm. Exam Location:  [facility] Procedure: Transesophageal Echo, Limited Color Doppler, Cardiac Doppler and            Saline Contrast Bubble Study Indications:     Endocarditis  History:         Patient has prior history of Echocardiogram examinations, most                  recent 07/09/2021. Signs/Symptoms:Sepsis; Risk                  Factors:Hypertension.  Sonographer:     Charmayne Sheer Referring Phys:  [id number] Ottie Glazier Diagnosing Phys: Ida Rogue MD PROCEDURE: TEE procedure time was 30 minutes. The transesophogeal probe was passed without difficulty through the esophogus of the patient. Imaged were obtained with the patient in a supine position. Sedation performed by different physician. The patient's vital signs; including heart rate, blood pressure, and oxygen saturation; remained stable throughout the procedure. The patient developed no complications during the procedure. IMPRESSIONS  1. No valve vegetation noted  2. Left ventricular ejection fraction, by estimation, is 60 to 65%. The left ventricle has normal function. The left ventricle has no regional wall motion abnormalities.  3. Right ventricular systolic function is normal. The right ventricular size is normal.  4. No left atrial/left atrial appendage thrombus was detected.  5. The mitral valve is normal in structure. No evidence of mitral valve regurgitation. No  evidence of mitral stenosis.  6. The aortic valve is normal in structure. Aortic valve regurgitation is not visualized. No aortic stenosis is present.  7. The inferior vena cava is normal in size with greater than 50% respiratory variability, suggesting right atrial pressure of 3 mmHg.  8. Agitated saline contrast bubble study was negative, with no evidence of any interatrial shunt. Conclusion(s)/Recommendation(s): Normal biventricular function without evidence of hemodynamically significant valvular heart disease. FINDINGS  Left Ventricle: Left ventricular ejection fraction, by estimation, is 60 to 65%. The left ventricle has normal function. The left ventricle has no regional wall motion abnormalities. The left ventricular internal cavity size was normal in size. There is  no left ventricular hypertrophy. Right Ventricle: The right ventricular size is normal. No increase in right ventricular wall thickness. Right ventricular systolic function is normal. Left Atrium: Left atrial size was normal in size. No  left atrial/left atrial appendage thrombus was detected. Right Atrium: Right atrial size was normal in size. Pericardium: There is no evidence of pericardial effusion. Mitral Valve: The mitral valve is normal in structure. No evidence of mitral valve regurgitation. No evidence of mitral valve stenosis. Tricuspid Valve: The tricuspid valve is normal in structure. Tricuspid valve regurgitation is not demonstrated. No evidence of tricuspid stenosis. Aortic Valve: The aortic valve is normal in structure. Aortic valve regurgitation is not visualized. No aortic stenosis is present. Pulmonic Valve: The pulmonic valve was normal in structure. Pulmonic valve regurgitation is not visualized. No evidence of pulmonic stenosis. Aorta: The aortic root is normal in size and structure. Venous: The inferior vena cava is normal in size with greater than 50% respiratory variability, suggesting right atrial pressure of 3 mmHg.  IAS/Shunts: No atrial level shunt detected by color flow Doppler. Agitated saline contrast was given intravenously to evaluate for intracardiac shunting. Agitated saline contrast bubble study was negative, with no evidence of any interatrial shunt. There  is no evidence of a patent foramen ovale. There is no evidence of an atrial septal defect. Ida Rogue MD Electronically signed by Ida Rogue MD Signature Date/Time: 07/12/2021/6:45:31 PM    Final    ECHOCARDIOGRAM COMPLETE BUBBLE STUDY  Result Date: 07/09/2021    ECHOCARDIOGRAM REPORT   Patient Name:   Craig [name] Date of Exam: 07/09/2021 Medical Rec #:  [id number]             Height:       73.0 in Accession #:    [id number]            Weight:       321.0 lb Date of Birth:  10-02-1990              BSA:          2.631 m Patient Age:    41 years              BP:           117/59 mmHg Patient Gender: M                     HR:           82 bpm. Exam Location:  [facility] Procedure: 2D Echo, Intracardiac Opacification Agent and Saline Contrast Bubble            Study Indications:     Bacteremia 790.7  History:         Patient has no prior history of Echocardiogram examinations.  Sonographer:     Kathlen Brunswick RDCS Referring Phys:  [id number] ADAM ROSS SCHERTZ Diagnosing Phys: Ida Rogue MD  Sonographer Comments: Suboptimal apical window, Technically challenging study due to limited acoustic windows, patient is morbidly obese, echo performed with patient supine and on artificial respirator and suboptimal parasternal window. Image acquisition  challenging due to patient body habitus and Image acquisition challenging due to respiratory motion. IMPRESSIONS  1. No valve vegetation noted.  2. Left ventricular ejection fraction, by estimation, is 50 to 55%. The left ventricle has low normal function. The left ventricle has no regional wall motion abnormalities. Left ventricular diastolic parameters were normal.  3. Right ventricular systolic function is  mildly reduced. The right ventricular size is mildly enlarged. Tricuspid regurgitation signal is inadequate for assessing PA pressure.  4. The mitral valve is normal in structure. No evidence of mitral valve regurgitation. No evidence of mitral stenosis.  5. The aortic  valve was not well visualized. Aortic valve regurgitation is not visualized. No aortic stenosis is present. FINDINGS  Left Ventricle: Left ventricular ejection fraction, by estimation, is 50 to 55%. The left ventricle has low normal function. The left ventricle has no regional wall motion abnormalities. Definity contrast agent was given IV to delineate the left ventricular endocardial borders. The left ventricular internal cavity size was normal in size. There is no left ventricular hypertrophy. Left ventricular diastolic parameters were normal. Right Ventricle: The right ventricular size is mildly enlarged. No increase in right ventricular wall thickness. Right ventricular systolic function is mildly reduced. Tricuspid regurgitation signal is inadequate for assessing PA pressure. Left Atrium: Left atrial size was normal in size. Right Atrium: Right atrial size was normal in size. Pericardium: There is no evidence of pericardial effusion. Mitral Valve: The mitral valve is normal in structure. No evidence of mitral valve regurgitation. No evidence of mitral valve stenosis. Tricuspid Valve: The tricuspid valve is normal in structure. Tricuspid valve regurgitation is not demonstrated. No evidence of tricuspid stenosis. Aortic Valve: The aortic valve was not well visualized. Aortic valve regurgitation is not visualized. No aortic stenosis is present. Aortic valve peak gradient measures 4.9 mmHg. Pulmonic Valve: The pulmonic valve was normal in structure. Pulmonic valve regurgitation is not visualized. No evidence of pulmonic stenosis. Aorta: The aortic root is normal in size and structure. Venous: The pulmonary veins were not well visualized. The inferior  vena cava was not well visualized. IAS/Shunts: No atrial level shunt detected by color flow Doppler. Agitated saline contrast was given intravenously to evaluate for intracardiac shunting.  LEFT VENTRICLE PLAX 2D LVIDd:         4.20 cm   Diastology LVIDs:         2.90 cm   LV e' medial:    11.00 cm/s LV PW:         1.20 cm   LV E/e' medial:  7.9 LV IVS:        1.30 cm   LV e' lateral:   8.38 cm/s LVOT diam:     2.20 cm   LV E/e' lateral: 10.4 LV SV:         46 LV SV Index:   17 LVOT Area:     3.80 cm  RIGHT VENTRICLE RV Basal diam:  3.50 cm RV S prime:     18.60 cm/s TAPSE (M-mode): 1.9 cm LEFT ATRIUM         Index       RIGHT ATRIUM           Index LA diam:    3.20 cm 1.22 cm/m  RA Area:     13.70 cm                                 RA Volume:   35.40 ml  13.46 ml/m  AORTIC VALVE                 PULMONIC VALVE AV Area (Vmax): 2.66 cm     PV Vmax:       0.94 m/s AV Vmax:        111.00 cm/s  PV Peak grad:  3.5 mmHg AV Peak Grad:   4.9 mmHg LVOT Vmax:      77.70 cm/s LVOT Vmean:     55.700 cm/s LVOT VTI:       0.120 m  AORTA Ao Root  diam: 3.20 cm MITRAL VALVE MV Area (PHT): 3.58 cm    SHUNTS MV Decel Time: 212 msec    Systemic VTI:  0.12 m MV E velocity: 87.40 cm/s  Systemic Diam: 2.20 cm MV A velocity: 46.70 cm/s MV E/A ratio:  1.87 Ida Rogue MD Electronically signed by Ida Rogue MD Signature Date/Time: 07/09/2021/12:56:41 PM    Final    [place] EKG SITE RITE  Result Date: 07/21/2021 If Site Rite image not attached, placement could not be confirmed due to current cardiac rhythm.  [place] EKG SITE RITE  Result Date: 07/08/2021 If Site Rite image not attached, placement could not be confirmed due to current cardiac rhythm.   Microbiology: No results found for this or any previous visit (from the past 240 hour(s)).   Labs: Basic Metabolic Panel: Recent Labs  Lab 07/21/21 0245 07/21/21 1732 07/22/21 0517 07/22/21 1706 07/23/21 0441 07/23/21 1838 07/24/21 0630 07/24/21 1746 07/25/21 0505   NA 136   < > 135   < > 136 140 139 134* 137  K 3.2*   < > 3.0*   < > 2.9* 3.0* 2.8* 3.5 3.0*  CL 94*   < > 91*   < > 90* 91* 92* 90* 94*  CO2 32   < > 36*   < > 38* 37* 37* 35* 38*  GLUCOSE 252*   < > 273*   < > 228* 234* 152* 181* 151*  BUN 5*   < > <5*   < > <5* <5* 5* 6 5*  CREATININE 0.49*   < > 0.44*   < > 0.37* 0.40* 0.56* 0.43* 0.40*  CALCIUM 9.0   < > 9.0   < > 9.3 9.4 9.7 9.8 9.7  MG 1.8  --  2.0  --  1.9  --  1.8  --  1.7   < > = values in this interval not displayed.   Liver Function Tests: No results for input(s): AST, ALT, ALKPHOS, BILITOT, PROT, ALBUMIN in the last 168 hours. No results for input(s): LIPASE, AMYLASE in the last 168 hours. No results for input(s): AMMONIA in the last 168 hours. CBC: Recent Labs  Lab 07/20/21 0403 07/21/21 0245 07/22/21 0517 07/23/21 0441 07/24/21 0630  WBC 7.6 9.6 8.4 6.5 7.9  NEUTROABS  --   --   --  3.7  --   HGB 7.6* 9.3* 9.2* 9.0* 9.5*  HCT 26.5* 30.9* 31.1* 30.3* 33.0*  MCV 95.3 94.2 94.5 95.6 95.7  PLT 255 306 270 271 310   Cardiac Enzymes: No results for input(s): CKTOTAL, CKMB, CKMBINDEX, TROPONINI in the last 168 hours. BNP: BNP (last 3 results) Recent Labs    08/20/20 0503 07/08/21 1448  BNP 46.9 28.7    ProBNP (last 3 results) No results for input(s): PROBNP in the last 8760 hours.  CBG: Recent Labs  Lab 07/24/21 1239 07/24/21 1625 07/24/21 2135 07/25/21 0859 07/25/21 1202  GLUCAP 163* 190* 187* 169* 269*    Principal Problem:   Acute on chronic respiratory failure with hypoxemia (HCC) Active Problems:   Colostomy status (HCC)   Pressure injury of skin   Time coordinating discharge: 38 minutes  Signed:        Taevyn Hausen, DO [facility]  07/25/2021, 5:08 PM

## 2021-07-25 NOTE — Progress Notes (Signed)
Nutrition Follow-up  DOCUMENTATION CODES:   Obesity unspecified  INTERVENTION:   -Continue MVI with minerals daily -Continue 500 mg vitamin C BID -Continue Boost Breeze po TID, each supplement provides 250 kcal and 9 grams of protein  -Continue double protein portions with meals   NUTRITION DIAGNOSIS:   Inadequate oral intake related to inability to eat as evidenced by NPO status.  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  11/19 admitted, intubated 11/21 extubated 11/22 s/p I&D R knee 11/23 s/p TEE 11/23- advanced to heart healthy diet 11/29- s/p Procedure(s): ARTHROSCOPY KNEE, IRRIGATION AND DEBRIDEMENT (Right) 12/2- PICC placed  Reviewed I/O's: -885 ml x 24 hours and -60 L since 07/11/21  UOP: 895 ml x 24 hours   Pt receiving nursing care at time of visit.   Pt remains with good appetite. Noted meal completions 50-100%. Pt is taking Boost Breeze supplements. Pt to remain on Boost Breeze supplement per his preference.   Medications reviewed and include potassium chloride.   Labs reviewed: K: 3.0, CBGS: 163-223 (inpatient orders for glycemic control are 0-20  units insulin aspart TID with meals, 10 units insulin aspart TID with meals, and 38 units insulin glargine-yfgn daily).    Diet Order:   Diet Order             Diet regular Room service appropriate? Yes; Fluid consistency: Thin  Diet effective now                   EDUCATION NEEDS:   No education needs have been identified at this time  Skin:  Skin Assessment: Reviewed RN Assessment (Stage II coccyx, open skin wounds) Skin Integrity Issues:: Incisions Stage II: coccyx Incisions: closed rt knee  Last BM:  07/24/21 (300 ml via colostomy)  Height:   Ht Readings from Last 1 Encounters:  07/18/21 6\' 1"  (1.854 m)    Weight:   Wt Readings from Last 1 Encounters:  07/25/21 (!) 141.2 kg    Ideal Body Weight:  83.6 kg  BMI:  Body mass index is 41.07 kg/m.  Estimated Nutritional Needs:   Kcal:  2500-2700  Protein:  140-165 grams  Fluid:  > 2 L    Loistine Chance, RD, LDN, [place] Registered Dietitian II Certified Diabetes Care and Education Specialist Please refer to [facility] for RD and/or RD on-call/weekend/after hours pager

## 2021-07-25 NOTE — Progress Notes (Signed)
PROGRESS NOTE  Craig [name] TMA:[id number] DOB: 09-23-1990 DOA: 07/08/2021 PCP: [name], MD  Brief History   Craig [name] is a 30 y.o. male with significant PMH of extensive medical history including inflammatory arthritis on methotrexate and chronic prednisone at 10 mg daily, prior severe Covid-19 infection requiring prolonged hospitalization and tracheostomy (since decannulated) Dec 2021 - Feb 2022, chronic hypoxic and hypercarbic respiratory failure on 2L O2 PRN, MSSA bacteremia, MSSA pneumonia, perforated diverticulitis s/p partial colectomy with ostomy creation, morbid obesity and gout who presented to the ED with chief complaints of progressive shortness of breath. Patient is currently intubated, history mostly obtained from patient's chart and patient's wife who is currently at the bedside.  Per patient's wife, EMS was called today due to worsening shortness of breath and intermittent altered mental status.  [name] stated the patient has been complaining of right knee pain was prescribed Percocet on Monday.  She took the Percocet at Monday and noticed worsening shortness of breath with mental status change per wife.  He also takes tramadol for pain.  No reports of chest pain, nausea vomiting, abdominal pain, diaphoresis, fevers or chills, cough or any other focal neurological symptoms or deficit.  On EMS arrival patient was noted to be hypoxic with a heart rate between 140 to 150 bpm.  He was placed on 6 L to maintain SPO2 greater than 90% and transported to the ED. In the ED, pt was noted to have seizure-like activity.  Pt was intubated, and PCCM consulted for admission and further management.   Pt was transferred to hospitalist service on 07/14/21.  The patient continues to require 4 L of O2 by [place]. He is on 2L O2 at home. 2/2 blood cultures from 07/08/2021 have grown out MSSA. There is concern for sepsis in the left shoulder. An attempt was made for an MRI to  investigate the shoulder, however, the patient was unable to tolerate MRI due to dyspnea with lying down flat. The patient continues to be volume overloaded. Will continue to diurese and attempt MRI again in a couple of days when his respiratory status is improved and he has some ativan prior to the study.  Infectious disease has placed OPAT orders. PICC line will be ordered. Continue diuresis for now to maximize respiratory status.  Potassium this am is 3.0. Supplement now and recheck potassium later today. If adequately repleted, plan to discharge to home.   Consultants  Infectious disease Orthopedic surgery  Procedures  None  Antibiotics   Anti-infectives (From admission, onward)    Start     Dose/Rate Route Frequency Ordered Stop   07/23/21 1600  ceFAZolin (ANCEF) IVPB 3g/100 mL premix        3 g 200 mL/hr over 30 Minutes Intravenous Every 8 hours 07/21/21 1604     07/10/21 1630  nafcillin 12 g in sodium chloride 0.9 % 500 mL continuous infusion        12 g 20.8 mL/hr over 24 Hours Intravenous Every 24 hours 07/10/21 1511 07/23/21 1759   07/10/21 1600  nafcillin 2 g in sodium chloride 0.9 % 100 mL IVPB        2 g 200 mL/hr over 30 Minutes Intravenous  Once 07/10/21 1511 07/10/21 1632   07/09/21 1000  ceFEPIme (MAXIPIME) 2 g in sodium chloride 0.9 % 100 mL IVPB  Status:  Discontinued        2 g 200 mL/hr over 30 Minutes Intravenous Every 8 hours 07/09/21 2563  07/10/21 1029   07/09/21 0600  ceFAZolin (ANCEF) IVPB 2g/100 mL premix  Status:  Discontinued        2 g 200 mL/hr over 30 Minutes Intravenous Every 8 hours 07/09/21 0311 07/10/21 1511   07/08/21 2000  vancomycin (VANCOREADY) IVPB 1500 mg/300 mL  Status:  Discontinued        1,500 mg 150 mL/hr over 120 Minutes Intravenous Every 8 hours 07/08/21 1900 07/09/21 0311   07/08/21 1830  ceFEPIme (MAXIPIME) 2 g in sodium chloride 0.9 % 100 mL IVPB  Status:  Discontinued        2 g 200 mL/hr over 30 Minutes Intravenous Every 8  hours 07/08/21 1726 07/09/21 0311   07/08/21 0945  vancomycin (VANCOCIN) IVPB 1000 mg/200 mL premix        1,000 mg 200 mL/hr over 60 Minutes Intravenous  Once 07/08/21 0935 07/08/21 1203   07/08/21 0945  ceFEPIme (MAXIPIME) 2 g in sodium chloride 0.9 % 100 mL IVPB        2 g 200 mL/hr over 30 Minutes Intravenous  Once 07/08/21 0935 07/08/21 1146   07/08/21 0945  azithromycin (ZITHROMAX) 500 mg in sodium chloride 0.9 % 250 mL IVPB        500 mg 250 mL/hr over 60 Minutes Intravenous  Once 07/08/21 0935 07/08/21 1420      Subjective  The patient is resting in bed. Wife is at bedside. No new complaints.  Objective   Vitals:  Vitals:   07/25/21 0959 07/25/21 1157  BP:  122/67  Pulse: (!) 112 (!) 111  Resp:  16  Temp:  99.3 F (37.4 C)  SpO2:  97%   Exam:  Constitutional:  The patient is awake, alert, and oriented x 3. No acute distress No wheezes or rhonchi No tactile fremitus Cardiovascular:  Regular rate and rhythm No murmurs, ectopy, or gallups. No lateral PMI. No thrills. Abdomen:  Abdomen is soft, non-tender, non-distended Morbidly obese No hernias, masses, or organomegaly Normoactive bowel sounds.  Musculoskeletal:  No cyanosis, clubbing Trace pitting edema of lower extremities bilaterally Skin:  No rashes, lesions, ulcers palpation of skin: no induration or nodules Neurologic:  CN 2-12 intact Sensation all 4 extremities intact Psychiatric:  Mental status Mood, affect appropriate Orientation to person, place, time  judgment and insight appear intact  I have personally reviewed the following:   Today's Data  Vitals  Lab Data  BMP, CBC  Micro Data  2/2 blood cultures from 07/08/2021 have grown out MSSA.   Imaging  None  Cardiology Data  EKG  Scheduled Meds:  acetaZOLAMIDE  250 mg Oral BID   allopurinol  300 mg Oral Daily   vitamin C  500 mg Oral BID   chlorhexidine gluconate (MEDLINE KIT)  15 mL Mouth Rinse BID   Chlorhexidine Gluconate  Cloth  6 each Topical Q0600   colchicine  0.6 mg Oral BID   COVID-19 mRNA bivalent vaccine (Moderna)  0.5 mL Intramuscular Once   DULoxetine  60 mg Oral Daily   enoxaparin (LOVENOX) injection  0.5 mg/kg Subcutaneous Q24H   famotidine  20 mg Oral BID   feeding supplement  1 Container Oral TID BM   folic acid  1 mg Oral Daily   furosemide  40 mg Oral BID   insulin aspart  0-20 Units Subcutaneous TID WC   insulin aspart  10 Units Subcutaneous TID WC   insulin glargine-yfgn  38 Units Subcutaneous BID   lidocaine  1 patch Transdermal  Q24H   metoprolol succinate  100 mg Oral Daily   multivitamin with minerals  1 tablet Oral Daily   nicotine  21 mg Transdermal Daily   nystatin   Topical TID   predniSONE  5 mg Oral Q breakfast   pregabalin  200 mg Oral BID   sodium chloride flush  10-40 mL Intracatheter Q12H   sodium chloride flush  10-40 mL Intracatheter Q12H   Continuous Infusions:  sodium chloride Stopped (07/22/21 0126)    ceFAZolin (ANCEF) IV 3 g (07/25/21 0501)    Principal Problem:   Acute on chronic respiratory failure with hypoxemia (HCC) Active Problems:   Colostomy status (HCC)   Pressure injury of skin   LOS: 17 days   A & P  Acute on chronic hypoxic and hypercarbic respiratory failure On chronic 2L home O2 - Extubated 11/23.  Currently on 3L [place], required bipap overnight --Continue supplemental O2 to keep sats >=90%, wean as tolerated. The patient is currently on 3L to maintain saturations of 100%. He is at baseline oxygen requirements.   Sepsis 2/2  MSSA bacteremia  Right septic joint s/p I/D on 07/11/21 and again on 07/18/21 -Infectious Disease consulted -s/p TEE - no veg -Repeat blood cultures neg. --purulent drainage noted from the knee wound so pt was taken to OR for 2nd I/D. Plan: --cont nafcillin, Will need 6 weeks of IV antibiotic.  Before discharge will change to cefazolin, per ID. --pain control for right knee --cont Hemovac, to be managed by ortho -  PICC line has been placed - Per infectious disease the patient will require cefazolin 3 gm IV Q8 hr x 4 weeks. End date will be 08/10/2021.   Hypokalemia: Supplement and recheck late this afternoon. D/C to home if adequately repleted.  Left shoulder pain --Xray of left shoulder negative --MRI of left shoulder to rule out septic joint could not be done due to patient's dyspnea with lying flat.   Shock -RESOLVED  - Favor sedation/analgesia related hypotension rather than septic. --s/p pressors --cont home prednisone (for rheumatoid arthritis); no indication for stress dose steroids at this time   Acute toxic metabolic encephalopathy, resolved --2/2 sepsis -CT Head wnl   Seizure Like Activity- RESOLVED -CT Head wnl -Loaded with Keppra 1500 mg x 1 -Stopped Keppra; monitor for recurrence of seizure activity, per neuro   H/o upper extremity DVT It appears that he was started on Xarelto before around Feb/Mar 2022 for provoked upper extremity DVT. He has completed an appropriate course of anticoagulation at this time and therapeutic anticoagulation will be discontinued. CTA chest did not reveal PE on admission.   DM2, well controlled --A1c 6.8 --increase glargine to 24u BID --cont mealtime 10u TID --SSI TID   Probable OSA --look into getting pt qualified for CPAP --CPAP nightly while inpatient   Anasarca --averages about 4L urine output per 1 dose of IV lasix 40 Plan: --cont IV lasix 60 mg BID --strict I/O  Contraction alkalosis, improved --improved after starting diamox --cont diamox for now   Hypoalbuminemia  --albumin 2.6 --pt refused nepro, supplement changed to clear boost --cont clear boost TID (however, dieticians keep changing it back to Nepro).    Complicated diverticulitis , perforation, s/p colectomy and colostomy --monitor output --change scheduled Miralax and Colace to PRN due to watery output    I have seen and examined this patient myself. I have spent 32  minutes in his evaluation and care.   DVT prophylaxis: Lovenox SQ Code Status: Full code  Family Communication: mother updated at bedside today   Level of care: Telemetry Surgical Dispo:   The patient is from: home Anticipated d/c is to: home with [facility] and DME, pt and wife refused SNF  Raden Byington, DO [facility] Direct contact: see [email or web address]  7PM-7AM contact night coverage as above 07/25/2021, 3:22 PM  LOS: 12 days

## 2021-07-26 ENCOUNTER — Other Ambulatory Visit: Payer: Self-pay | Admitting: Student in an Organized Health Care Education/Training Program

## 2021-07-26 LAB — GLUCOSE, CAPILLARY: Glucose-Capillary: 126 mg/dL — ABNORMAL HIGH (ref 70–99)

## 2021-07-26 MED ORDER — BLOOD GLUCOSE MONITOR KIT: PACK | 0 refills | Status: AC

## 2021-07-27 ENCOUNTER — Other Ambulatory Visit: Payer: Self-pay | Admitting: Student in an Organized Health Care Education/Training Program

## 2021-07-27 MED ORDER — INSULIN GLARGINE 100 UNIT/ML SOLOSTAR PEN: 40.0000 [IU] | PEN_INJECTOR | Freq: Every day | SUBCUTANEOUS | 0 refills | Status: DC

## 2021-07-27 NOTE — Progress Notes (Signed)
Patient was discharged from hospital with prescription for novolog TID with meals and semglee 38 units BID.  They have had difficulty obtaining those prescriptions that were filled by my colleague.  I have ordered a glucometer with test strips and lancets for testing I have also changed semglee to lantus 40 units am.  I left a detailed phone message with instructions to pick up the prescription and ask pharmacist to assist in showing them how to use the syringe and also instructed patient to follow up ASAP with his PCP for further management of his diabetes.

## 2021-08-03 ENCOUNTER — Other Ambulatory Visit: Payer: Medicaid Other

## 2021-08-08 ENCOUNTER — Ambulatory Visit: Payer: Medicaid Other | Attending: Infectious Diseases | Admitting: Infectious Diseases

## 2021-08-08 ENCOUNTER — Encounter: Payer: Self-pay | Admitting: Infectious Diseases

## 2021-08-08 ENCOUNTER — Other Ambulatory Visit: Payer: Self-pay

## 2021-08-08 VITALS — BP 135/84

## 2021-08-08 DIAGNOSIS — R7881 Bacteremia: Secondary | ICD-10-CM | POA: Diagnosis not present

## 2021-08-08 DIAGNOSIS — Z9889 Other specified postprocedural states: Secondary | ICD-10-CM | POA: Insufficient documentation

## 2021-08-08 DIAGNOSIS — B9561 Methicillin susceptible Staphylococcus aureus infection as the cause of diseases classified elsewhere: Secondary | ICD-10-CM | POA: Insufficient documentation

## 2021-08-08 DIAGNOSIS — M00061 Staphylococcal arthritis, right knee: Secondary | ICD-10-CM

## 2021-08-08 MED ORDER — CEFADROXIL 1 G PO TABS: 1.0000 g | ORAL_TABLET | Freq: Two times a day (BID) | ORAL | 0 refills | Status: DC

## 2021-08-08 NOTE — Progress Notes (Signed)
The purpose of this virtual visit is to provide medical care while limiting exposure to the novel coronavirus (COVID19) for both patient and office staff.   Consent was obtained for phone visit:  Yes.   Answered questions that patient had about telehealth interaction:  Yes.   I discussed the limitations, risks, security and privacy concerns of performing an evaluation and management service by telephone. I also discussed with the patient that there may be a patient responsible charge related to this service. The patient expressed understanding and agreed to proceed.   Patient Location: Home Provider Location: office People on the call - patient, wife, provider, CMA  Follow-up after recent hospitalization Patient was in the hospital between 07/08/2021 until 07/25/2021 for septic arthritis of his right knee with MSSA.  He also had MSSA bacteremia He underwent arthroscopic washout of the right knee. He was initially treated with IV nafcillin and later switched to IV cefazolin.  He will complete 4 weeks of IV antibiotics on 08/10/2021. Patient is doing little better He still complains of pain in the knee and stiffness His wife says his appetite has been poor but is better than before The left shoulder pain which she had during the hospitalization has resolved He uses home oxygen following COVID which she had December 2021 until February 2022 when he had a long hospitalization. He is bedbound  He has no diarrhea He has no rash  Objective No physical examination was done because of telephone visit  Impression/recommendation MSSA bacteremia with MSSA right knee septic arthritis.  Status post I&D x2.  TEE negative.  Repeat blood culture negative.  He will complete 4 weeks of IV cefazolin on 08/10/21 and will then start cefadroxil 1 g p.o. twice daily for 30 days. Will follow him in 4 weeks. Total time spent 14 minutes on the telephone call.

## 2021-08-09 ENCOUNTER — Inpatient Hospital Stay
Admission: EM | Admit: 2021-08-09 | Discharge: 2021-08-23 | DRG: 870 | Disposition: A | Discharge status: Home Health | Payer: Medicaid Other | Attending: Internal Medicine | Admitting: Internal Medicine

## 2021-08-09 ENCOUNTER — Other Ambulatory Visit: Payer: Self-pay

## 2021-08-09 ENCOUNTER — Encounter: Payer: Self-pay | Admitting: Emergency Medicine

## 2021-08-09 ENCOUNTER — Emergency Department: Payer: Medicaid Other

## 2021-08-09 DIAGNOSIS — J440 Chronic obstructive pulmonary disease with acute lower respiratory infection: Secondary | ICD-10-CM | POA: Diagnosis present

## 2021-08-09 DIAGNOSIS — E876 Hypokalemia: Secondary | ICD-10-CM | POA: Diagnosis present

## 2021-08-09 DIAGNOSIS — Z7901 Long term (current) use of anticoagulants: Secondary | ICD-10-CM

## 2021-08-09 DIAGNOSIS — Z86718 Personal history of other venous thrombosis and embolism: Secondary | ICD-10-CM

## 2021-08-09 DIAGNOSIS — M064 Inflammatory polyarthropathy: Secondary | ICD-10-CM | POA: Diagnosis present

## 2021-08-09 DIAGNOSIS — Z7952 Long term (current) use of systemic steroids: Secondary | ICD-10-CM

## 2021-08-09 DIAGNOSIS — Z79899 Other long term (current) drug therapy: Secondary | ICD-10-CM

## 2021-08-09 DIAGNOSIS — Z20822 Contact with and (suspected) exposure to covid-19: Secondary | ICD-10-CM | POA: Diagnosis present

## 2021-08-09 DIAGNOSIS — R509 Fever, unspecified: Secondary | ICD-10-CM

## 2021-08-09 DIAGNOSIS — T501X6A Underdosing of loop [high-ceiling] diuretics, initial encounter: Secondary | ICD-10-CM | POA: Diagnosis present

## 2021-08-09 DIAGNOSIS — R6520 Severe sepsis without septic shock: Secondary | ICD-10-CM | POA: Diagnosis present

## 2021-08-09 DIAGNOSIS — E119 Type 2 diabetes mellitus without complications: Secondary | ICD-10-CM | POA: Diagnosis present

## 2021-08-09 DIAGNOSIS — E662 Morbid (severe) obesity with alveolar hypoventilation: Secondary | ICD-10-CM | POA: Diagnosis present

## 2021-08-09 DIAGNOSIS — I119 Hypertensive heart disease without heart failure: Secondary | ICD-10-CM | POA: Diagnosis present

## 2021-08-09 DIAGNOSIS — J9621 Acute and chronic respiratory failure with hypoxia: Secondary | ICD-10-CM | POA: Diagnosis present

## 2021-08-09 DIAGNOSIS — Z8619 Personal history of other infectious and parasitic diseases: Secondary | ICD-10-CM

## 2021-08-09 DIAGNOSIS — Z4659 Encounter for fitting and adjustment of other gastrointestinal appliance and device: Secondary | ICD-10-CM

## 2021-08-09 DIAGNOSIS — J9601 Acute respiratory failure with hypoxia: Secondary | ICD-10-CM

## 2021-08-09 DIAGNOSIS — Z9114 Patient's other noncompliance with medication regimen: Secondary | ICD-10-CM

## 2021-08-09 DIAGNOSIS — R4587 Impulsiveness: Secondary | ICD-10-CM | POA: Diagnosis present

## 2021-08-09 DIAGNOSIS — Z9049 Acquired absence of other specified parts of digestive tract: Secondary | ICD-10-CM

## 2021-08-09 DIAGNOSIS — A4101 Sepsis due to Methicillin susceptible Staphylococcus aureus: Principal | ICD-10-CM | POA: Diagnosis present

## 2021-08-09 DIAGNOSIS — Z880 Allergy status to penicillin: Secondary | ICD-10-CM

## 2021-08-09 DIAGNOSIS — F419 Anxiety disorder, unspecified: Secondary | ICD-10-CM | POA: Diagnosis present

## 2021-08-09 DIAGNOSIS — M6284 Sarcopenia: Secondary | ICD-10-CM | POA: Diagnosis present

## 2021-08-09 DIAGNOSIS — J1282 Pneumonia due to coronavirus disease 2019: Secondary | ICD-10-CM | POA: Diagnosis present

## 2021-08-09 DIAGNOSIS — Z597 Insufficient social insurance and welfare support: Secondary | ICD-10-CM

## 2021-08-09 DIAGNOSIS — G4733 Obstructive sleep apnea (adult) (pediatric): Secondary | ICD-10-CM | POA: Diagnosis present

## 2021-08-09 DIAGNOSIS — D649 Anemia, unspecified: Secondary | ICD-10-CM | POA: Diagnosis present

## 2021-08-09 DIAGNOSIS — A419 Sepsis, unspecified organism: Secondary | ICD-10-CM

## 2021-08-09 DIAGNOSIS — J15211 Pneumonia due to Methicillin susceptible Staphylococcus aureus: Secondary | ICD-10-CM | POA: Diagnosis present

## 2021-08-09 DIAGNOSIS — J9622 Acute and chronic respiratory failure with hypercapnia: Secondary | ICD-10-CM | POA: Diagnosis present

## 2021-08-09 DIAGNOSIS — G9341 Metabolic encephalopathy: Secondary | ICD-10-CM | POA: Diagnosis present

## 2021-08-09 DIAGNOSIS — M00061 Staphylococcal arthritis, right knee: Secondary | ICD-10-CM | POA: Diagnosis present

## 2021-08-09 DIAGNOSIS — Z6841 Body Mass Index (BMI) 40.0 and over, adult: Secondary | ICD-10-CM

## 2021-08-09 DIAGNOSIS — Z720 Tobacco use: Secondary | ICD-10-CM

## 2021-08-09 DIAGNOSIS — Z794 Long term (current) use of insulin: Secondary | ICD-10-CM

## 2021-08-09 LAB — COMPREHENSIVE METABOLIC PANEL
ALT: 8 U/L (ref 0–44)
AST: 57 U/L — ABNORMAL HIGH (ref 15–41)
Albumin: 3.1 g/dL — ABNORMAL LOW (ref 3.5–5.0)
Alkaline Phosphatase: 70 U/L (ref 38–126)
Anion gap: 6 (ref 5–15)
BUN: 6 mg/dL (ref 6–20)
CO2: 36 mmol/L — ABNORMAL HIGH (ref 22–32)
Calcium: 10 mg/dL (ref 8.9–10.3)
Chloride: 94 mmol/L — ABNORMAL LOW (ref 98–111)
Creatinine, Ser: 0.63 mg/dL (ref 0.61–1.24)
GFR, Estimated: >60 mL/min (ref >60)
Glucose, Bld: 259 mg/dL — ABNORMAL HIGH (ref 70–99)
Potassium: 4.3 mmol/L (ref 3.5–5.1)
Sodium: 136 mmol/L (ref 135–145)
Total Bilirubin: 0.9 mg/dL (ref 0.3–1.2)
Total Protein: 7.8 g/dL (ref 6.5–8.1)

## 2021-08-09 LAB — BLOOD GAS, ARTERIAL
Acid-Base Excess: 9.4 mmol/L — ABNORMAL HIGH (ref 0.0–2.0)
Bicarbonate: 36.4 mmol/L — ABNORMAL HIGH (ref 20.0–28.0)
FIO2: 0.6
MECHVT: 530 mL
Mechanical Rate: 18
O2 Saturation: 98.3 %
PEEP: 5 cmH2O
Patient temperature: 37
RATE: 18 resp/min
pCO2 arterial: 63 mmHg — ABNORMAL HIGH (ref 32.0–48.0)
pH, Arterial: 7.37 (ref 7.350–7.450)
pO2, Arterial: 113 mmHg — ABNORMAL HIGH (ref 83.0–108.0)

## 2021-08-09 LAB — URINALYSIS, COMPLETE (UACMP) WITH MICROSCOPIC
Bacteria, UA: NONE SEEN
Bilirubin Urine: NEGATIVE
Glucose, UA: NEGATIVE mg/dL
Ketones, ur: NEGATIVE mg/dL
Leukocytes,Ua: NEGATIVE
Nitrite: NEGATIVE
Protein, ur: >300 mg/dL — AB
RBC / HPF: >50 RBC/hpf (ref 0–5)
Specific Gravity, Urine: >1.03 — ABNORMAL HIGH (ref 1.005–1.030)
WBC, UA: >50 WBC/hpf (ref 0–5)
pH: 5.5 (ref 5.0–8.0)

## 2021-08-09 LAB — APTT: aPTT: 32 seconds (ref 24–36)

## 2021-08-09 LAB — LACTIC ACID, PLASMA: Lactic Acid, Venous: 3.7 mmol/L (ref 0.5–1.9)

## 2021-08-09 LAB — CBC
HCT: 36.6 % — ABNORMAL LOW (ref 39.0–52.0)
Hemoglobin: 10.3 g/dL — ABNORMAL LOW (ref 13.0–17.0)
MCH: 27.6 pg (ref 26.0–34.0)
MCHC: 28.1 g/dL — ABNORMAL LOW (ref 30.0–36.0)
MCV: 98.1 fL (ref 80.0–100.0)
Platelets: 368 10*3/uL (ref 150–400)
RBC: 3.73 MIL/uL — ABNORMAL LOW (ref 4.22–5.81)
RDW: 18 % — ABNORMAL HIGH (ref 11.5–15.5)
WBC: 22.6 10*3/uL — ABNORMAL HIGH (ref 4.0–10.5)
nRBC: 0.1 % (ref 0.0–0.2)

## 2021-08-09 LAB — RESP PANEL BY RT-PCR (FLU A&B, COVID) ARPGX2
Influenza A by PCR: NEGATIVE
Influenza B by PCR: NEGATIVE
SARS Coronavirus 2 by RT PCR: NEGATIVE

## 2021-08-09 LAB — PROTIME-INR
INR: 1.2 (ref 0.8–1.2)
Prothrombin Time: 15.1 seconds (ref 11.4–15.2)

## 2021-08-09 LAB — BRAIN NATRIURETIC PEPTIDE: B Natriuretic Peptide: 36.9 pg/mL (ref 0.0–100.0)

## 2021-08-09 LAB — TROPONIN I (HIGH SENSITIVITY): Troponin I (High Sensitivity): 23 ng/L — ABNORMAL HIGH (ref <18)

## 2021-08-09 IMAGING — DX DG CHEST 1V PORT
1 series · 2 of 2 positions shown · non-contrast
Comparison: [DATE]

CLINICAL DATA: Shortness of breath check endotracheal tube
placement

EXAM:
PORTABLE CHEST 1 VIEW

[Series 1: chest ap · 0.14mm/px · 2 of 2 slices shown]
[im 1/2]
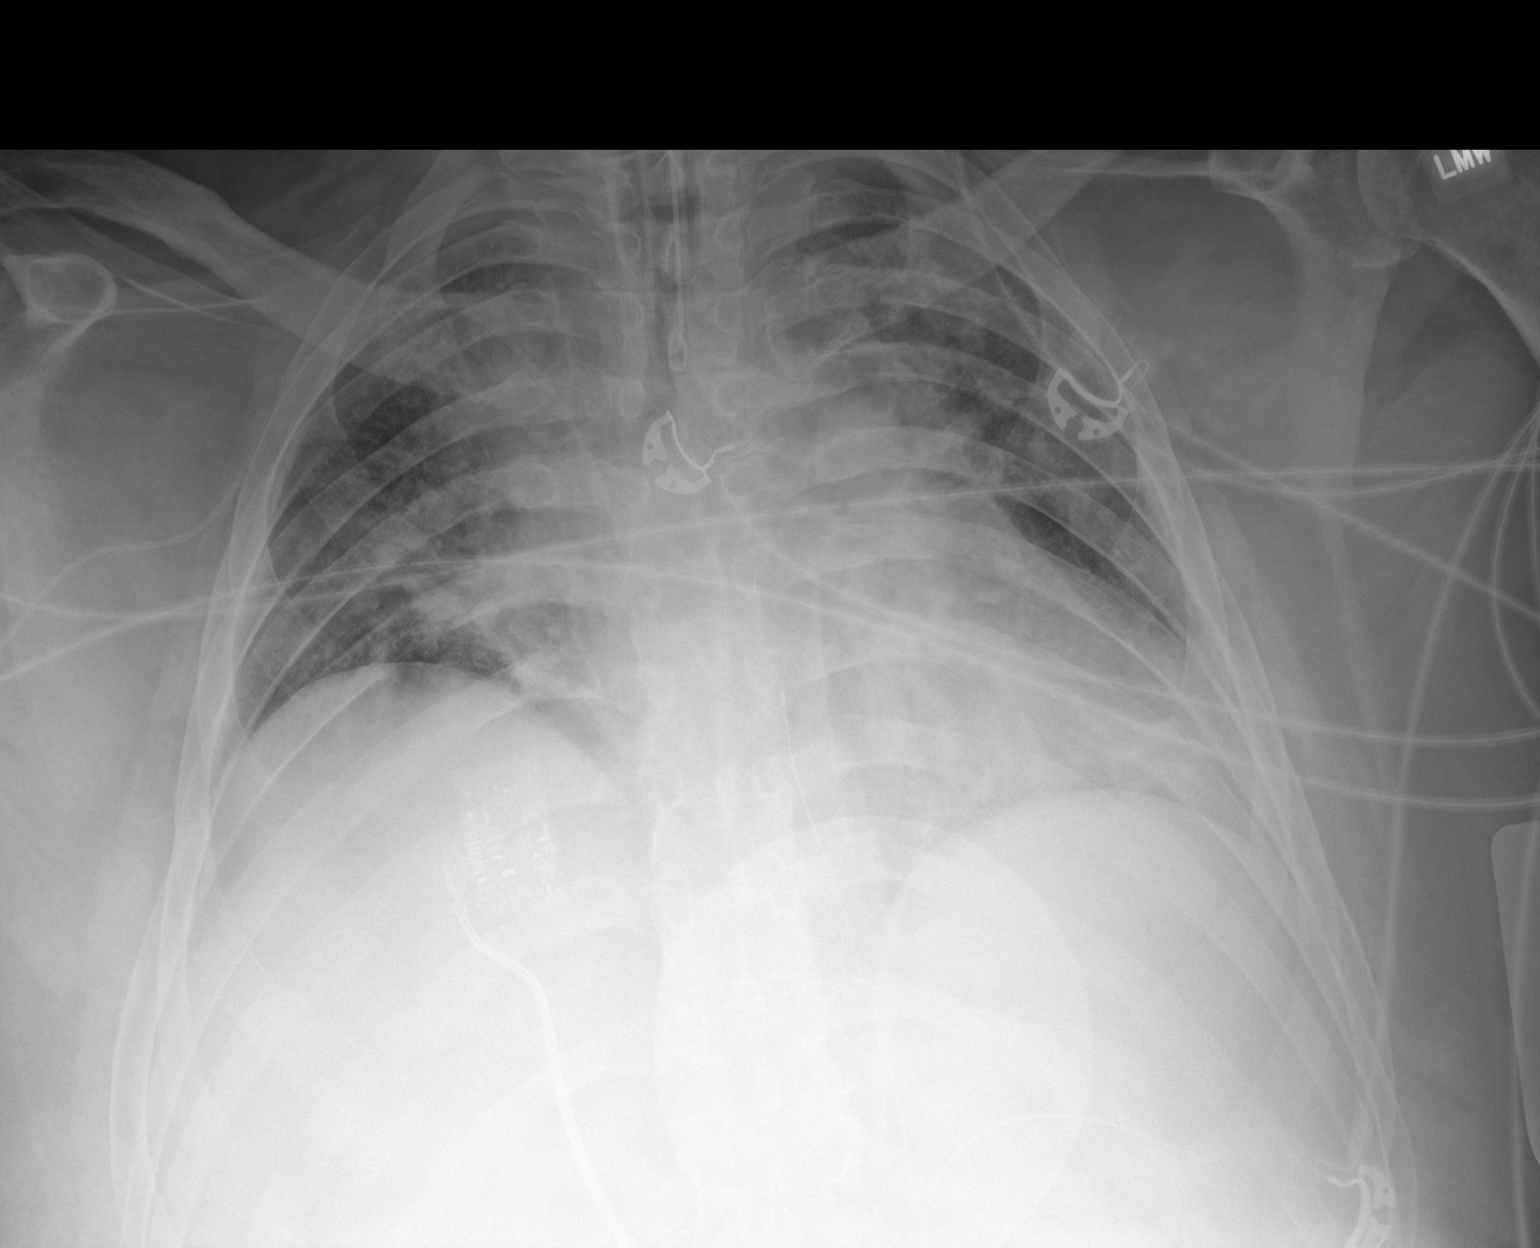
[im 2/2]
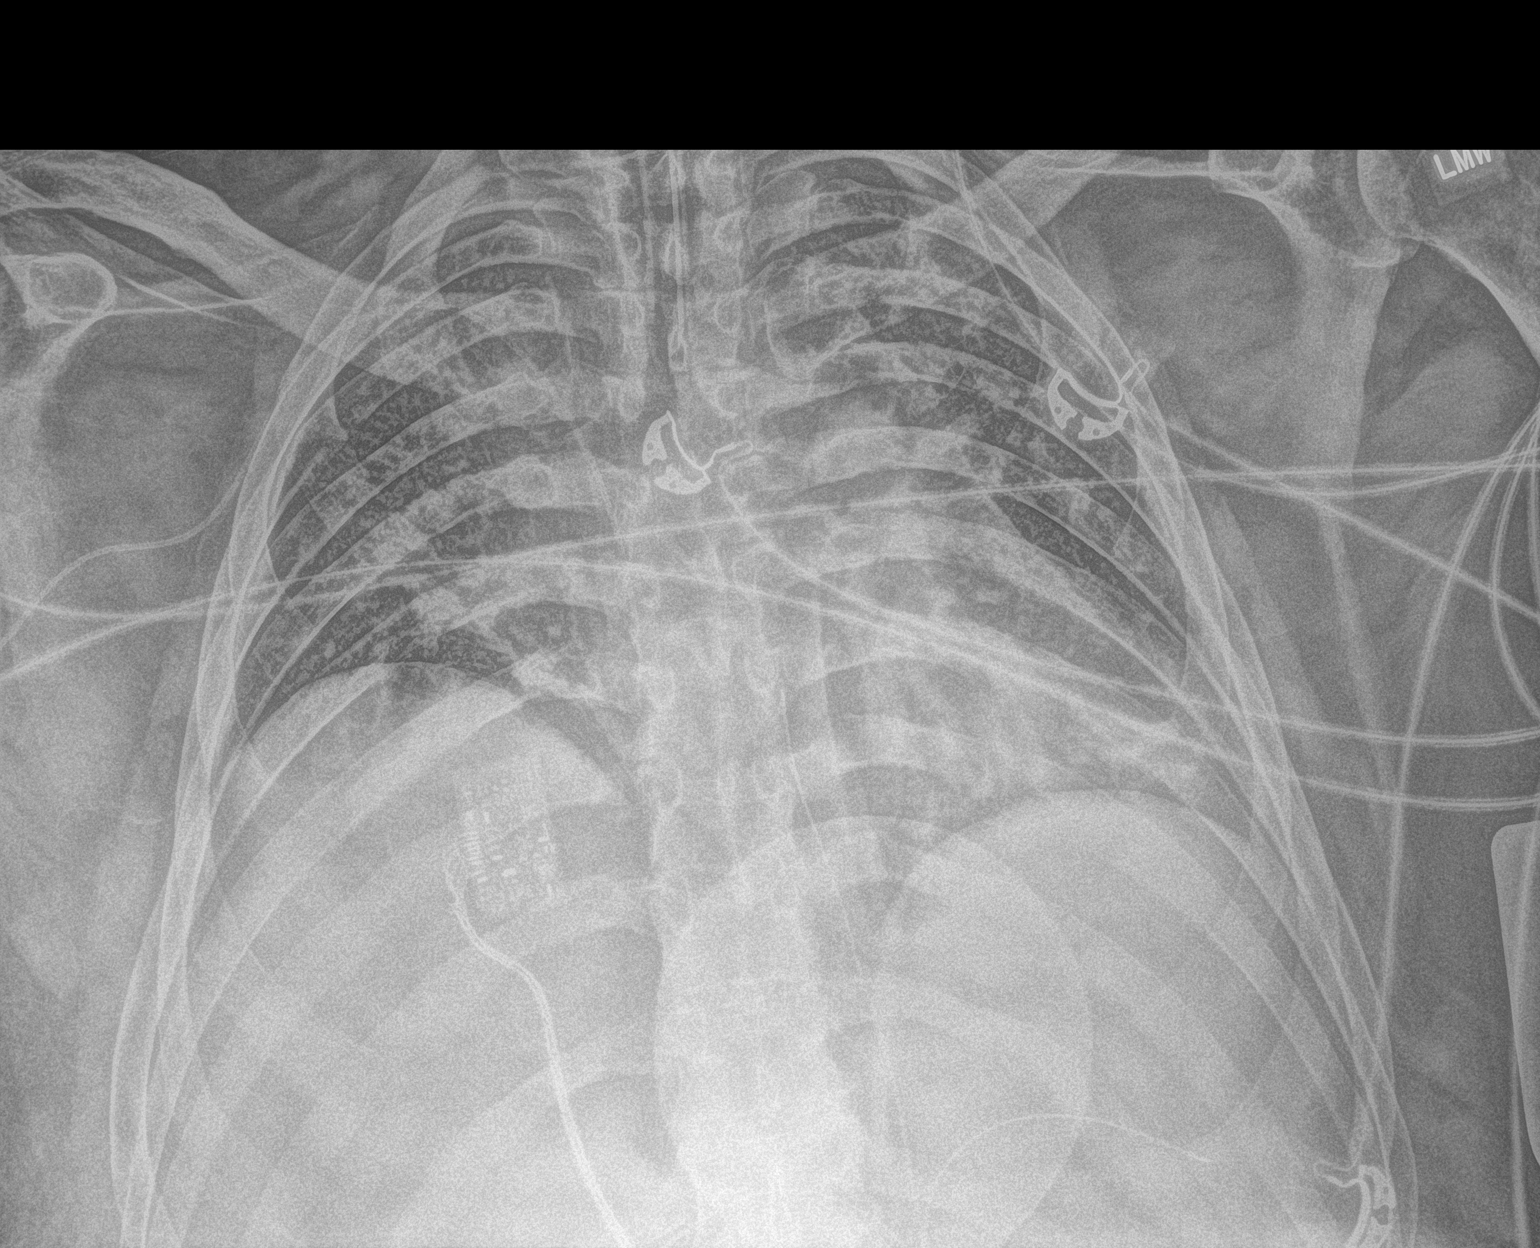

[2 of 2 positions shown; findings below may reference images not displayed]

FINDINGS: Endotracheal tube is noted in satisfactory position above the
carina. Cardiac shadow is stable. Vascular congestion is noted.
Right-sided PICC line is seen at the cavoatrial junction. No bony
abnormality is noted.
IMPRESSION: Vascular congestion.

Tubes and lines as described.

## 2021-08-09 MED ORDER — SODIUM CHLORIDE 0.9 % IV SOLN
2.0000 g | Freq: Once | INTRAVENOUS | Status: AC
  Administered 2021-08-09: 2 g via INTRAVENOUS
  Filled 2021-08-09: qty 2

## 2021-08-09 MED ORDER — SUCCINYLCHOLINE CHLORIDE 200 MG/10ML IV SOSY
130.0000 mg | PREFILLED_SYRINGE | Freq: Once | INTRAVENOUS | Status: AC
  Administered 2021-08-09: 23:00:00 130 mg via INTRAVENOUS

## 2021-08-09 MED ORDER — ETOMIDATE 2 MG/ML IV SOLN
30.0000 mg | Freq: Once | INTRAVENOUS | Status: AC
  Administered 2021-08-09: 23:00:00 30 mg via INTRAVENOUS

## 2021-08-09 MED ORDER — VANCOMYCIN HCL IN DEXTROSE 1-5 GM/200ML-% IV SOLN: 1000.0000 mg | Freq: Once | INTRAVENOUS | Status: DC

## 2021-08-09 MED ORDER — SODIUM CHLORIDE 0.9 % IV BOLUS (SEPSIS)
1000.0000 mL | Freq: Once | INTRAVENOUS | Status: AC
  Administered 2021-08-09: 1000 mL via INTRAVENOUS

## 2021-08-09 MED ORDER — LACTATED RINGERS IV SOLN: INTRAVENOUS | Status: AC

## 2021-08-09 MED ORDER — MIDAZOLAM HCL 2 MG/2ML IJ SOLN
2.0000 mg | Freq: Once | INTRAMUSCULAR | Status: AC
  Administered 2021-08-09: 2 mg via INTRAVENOUS

## 2021-08-09 MED ORDER — MIDAZOLAM HCL 2 MG/2ML IJ SOLN
2.0000 mg | Freq: Once | INTRAMUSCULAR | Status: AC
  Administered 2021-08-09: 23:00:00 2 mg via INTRAVENOUS

## 2021-08-09 MED ORDER — FENTANYL CITRATE PF 50 MCG/ML IJ SOSY
PREFILLED_SYRINGE | INTRAMUSCULAR | Status: AC
  Filled 2021-08-09: qty 2

## 2021-08-09 MED ORDER — INSULIN ASPART 100 UNIT/ML IJ SOLN
0.0000 [IU] | INTRAMUSCULAR | Status: DC
  Administered 2021-08-10: 5 [IU] via SUBCUTANEOUS
  Filled 2021-08-09: qty 1

## 2021-08-09 MED ORDER — FENTANYL CITRATE PF 50 MCG/ML IJ SOSY
100.0000 ug | PREFILLED_SYRINGE | Freq: Once | INTRAMUSCULAR | Status: AC
  Administered 2021-08-09: 23:00:00 100 ug via INTRAVENOUS

## 2021-08-09 MED ORDER — MIDAZOLAM-SODIUM CHLORIDE 100-0.9 MG/100ML-% IV SOLN
2.0000 mg/h | INTRAVENOUS | Status: DC
Start: 2021-08-09 — End: 2021-08-10
  Administered 2021-08-09: 23:00:00 2 mg/h via INTRAVENOUS
  Filled 2021-08-09: qty 100

## 2021-08-09 MED ORDER — MIDAZOLAM HCL 2 MG/2ML IJ SOLN
INTRAMUSCULAR | Status: AC
  Filled 2021-08-09: qty 2

## 2021-08-09 MED ORDER — PROPOFOL BOLUS VIA INFUSION
1.0000 mg/kg | INTRAVENOUS | Status: DC | PRN
  Filled 2021-08-09: qty 80

## 2021-08-09 MED ORDER — VANCOMYCIN HCL 500 MG/100ML IV SOLN
500.0000 mg | Freq: Once | INTRAVENOUS | Status: DC
  Filled 2021-08-09: qty 100

## 2021-08-09 MED ORDER — METRONIDAZOLE 500 MG/100ML IV SOLN
500.0000 mg | Freq: Once | INTRAVENOUS | Status: AC
  Administered 2021-08-10: 500 mg via INTRAVENOUS
  Filled 2021-08-09: qty 100

## 2021-08-09 MED ORDER — PROPOFOL 1000 MG/100ML IV EMUL
5.0000 ug/kg/min | INTRAVENOUS | Status: DC
  Filled 2021-08-09: qty 100

## 2021-08-09 MED ORDER — VANCOMYCIN HCL 2000 MG/400ML IV SOLN
2000.0000 mg | Freq: Once | INTRAVENOUS | Status: AC
  Administered 2021-08-10: 01:00:00 2000 mg via INTRAVENOUS
  Filled 2021-08-09: qty 400

## 2021-08-09 MED ORDER — PROPOFOL 1000 MG/100ML IV EMUL
INTRAVENOUS | Status: AC
  Administered 2021-08-09: 23:00:00 25 ug/kg/min via INTRAVENOUS
  Filled 2021-08-09: qty 100

## 2021-08-09 NOTE — Progress Notes (Signed)
CODE SEPSIS - PHARMACY COMMUNICATION  **Broad Spectrum Antibiotics should be administered within 1 hour of Sepsis diagnosis**  Time Code Sepsis Called/Page Received: 12/21 @ 2313  Antibiotics Ordered: Vanc, Aztreonam   Time of 1st antibiotic administration: Aztreonam 2 gm IV X 1 on 12/21 @ 2339   Additional action taken by pharmacy: none   If necessary, Name of Provider/Nurse Contacted:     Sophira Rumler D ,PharmD Clinical Pharmacist  08/09/2021  11:50 PM

## 2021-08-09 NOTE — ED Notes (Signed)
Date and time results received: 08/09/21 2359 (use smartphrase ".now" to insert current time)  Test: lactic acid Critical Value: 3.7  Name of Provider Notified: z. Tamala Julian md  Orders Received? Or Actions Taken?:  n/a

## 2021-08-09 NOTE — ED Notes (Signed)
30mg  etomidate given at 2239 130mg  succ given at 2240  Tube in at 2241 + color change at 2241 26 at the lip

## 2021-08-09 NOTE — ED Triage Notes (Signed)
Pt from home, upon arrival pt was seizing. Pt being bagged upon arrival.

## 2021-08-09 NOTE — ED Provider Notes (Addendum)
[facility] Emergency Department Provider Note  Time seen: 10:47 PM  I have reviewed the triage vital signs and the nursing notes.   HISTORY  Chief Complaint Respiratory Distress   HPI Craig [name] is a 30 y.o. male with a past medical history of hypertension, chronic respiratory failure with hypoxemia, obesity, presents to the emergency department for acute onset of shortness of breath.  According EMS patient was hospitalized proximally 1 year ago for COVID requiring prolonged ventilation of nearly 60 days.  Per report became acutely short of breath today and EMS was called.  EMS states upon their arrival patient suffered a approximately 20-32nd generalized tonic-clonic seizure.  Per EMS family states no history of seizure disorder previously.  EMS states prior to the seizure patient was satting 85% on a nonrebreather mask placed by the fire department.  Here patient is largely unresponsive.  No response to deep sternal rub.  Patient will occasionally lift his head off the bed but then falls back towards the bed.  Keeps his eyes closed throughout evaluation.  On arrival patient being bagged by EMS to maintain sats in the low 90s.   Past Medical History:  Diagnosis Date   Gout    Hypertension    Rupture of bowel [facility])     Patient Active Problem List   Diagnosis Date Noted   Acute on chronic respiratory failure with hypoxemia (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   Chronic gouty  arthritis 02/17/2020   Class 2 severe obesity with serious comorbidity in adult [facility]) 02/17/2020   Diverticulitis of colon with perforation 11/22/2019    Past Surgical History:  Procedure Laterality Date   COLONOSCOPY WITH PROPOFOL N/A 05/18/2020   Procedure: COLONOSCOPY WITH PROPOFOL;  Surgeon: Benjamine Sprague, DO;  Location: [place] [facility]COPY;  Service: General;  Laterality: N/A;   COLOSTOMY N/A 11/22/2019   Procedure: COLOSTOMY;  Surgeon: Herbert Pun, MD;  Location: [facility] ORS;  Service: General;  Laterality: N/A;   KNEE ARTHROSCOPY Right 07/11/2021   Procedure: SYNOVIAL BIOPSY AND ARTHROSCOPIC IRRGATION AND DEBRIDEMENT OF SEPTIC KNEE;  Surgeon: Hessie Knows, MD;  Location: [facility] ORS;  Service: Orthopedics;  Laterality: Right;   KNEE ARTHROSCOPY Right 07/18/2021   Procedure: ARTHROSCOPY KNEE, IRRIGATION AND DEBRIDEMENT;  Surgeon: Hessie Knows, MD;  Location: [facility] ORS;  Service: Orthopedics;  Laterality: Right;   LAPAROTOMY N/A 11/22/2019   Procedure: EXPLORATORY LAPAROTOMY;  Surgeon: Herbert Pun, MD;  Location: [facility] ORS;  Service: General;  Laterality: N/A;   LAPAROTOMY N/A 07/08/2020   Procedure: EXPLORATORY LAPAROTOMY;  Surgeon: Herbert Pun, MD;  Location: [facility] ORS;  Service: General;  Laterality: N/A;   PARTIAL COLECTOMY N/A 11/22/2019   Procedure: PARTIAL COLECTOMY;  Surgeon: Herbert Pun, MD;  Location: [facility] ORS;  Service: General;  Laterality: N/A;   PEG PLACEMENT N/A 09/14/2020   Procedure: PERCUTANEOUS ENDOSCOPIC GASTROSTOMY (PEG) PLACEMENT;  Surgeon: Benjamine Sprague, DO;  Location: [facility] ENDOSCOPY;  Service: General;  Laterality: N/A;  TRAVEL CASE TRACH [place] Right 07/11/2021  Procedure: SYNOVIAL BIOPSY;  Surgeon: Hessie Knows, MD;  Location: [facility] ORS;  Service: Orthopedics;  Laterality: Right;   TRACHEOSTOMY TUBE PLACEMENT N/A 09/13/2020   Procedure: TRACHEOSTOMY;  Surgeon: Beverly Gust, MD;  Location: [facility] ORS;  Service:  ENT;  Laterality: N/A;   XI ROBOTIC ASSISTED COLOSTOMY TAKEDOWN N/A 06/28/2020   Procedure: XI ROBOTIC ASSISTED COLOSTOMY TAKEDOWN CONVERTED TO OPEN PROCEDURE;  Surgeon: Herbert Pun, MD;  Location: [facility] ORS;  Service: General;  Laterality: N/A;    Prior to Admission medications   Medication Sig Start Date End Date Taking? Authorizing Provider  acetaminophen (TYLENOL) 650 MG CR tablet Take by mouth. 10/14/20   [provider]  acetaZOLAMIDE (DIAMOX) 250 MG tablet Take 1 tablet (250 mg total) by mouth 2 (two) times daily. 07/25/21   Swayze, Ava, DO  albuterol (ACCUNEB) 1.25 MG/3ML nebulizer solution Take by nebulization. 03/07/21   [provider]  allopurinol (ZYLOPRIM) 300 MG tablet Take 300 mg by mouth daily. 02/09/21   [provider]  ascorbic acid (VITAMIN C) 500 MG tablet Take 1 tablet (500 mg total) by mouth 2 (two) times daily. 07/25/21   Swayze, Ava, DO  blood glucose meter kit and supplies KIT Dispense based on patient and insurance preference. Use up to four times daily as directed. 07/26/21   Richarda Osmond, MD  budesonide (PULMICORT) 0.25 MG/2ML nebulizer solution Inhale into the lungs. 03/07/21 03/07/22  [provider]  cefadroxil (DURICEF) 1 g tablet Take 1 tablet (1 g total) by mouth 2 (two) times daily. 08/08/21   Tsosie Billing, MD  cetirizine (ZYRTEC) 10 MG tablet Take 10 mg by mouth daily.    [provider]  Cholecalciferol (VITAMIN D3) 25 MCG (1000 UT) CAPS Take by mouth.    [provider]  clonazePAM [place]bye [place]) 1 MG tablet Take by mouth. 10/14/20   [provider]  [employer] SALINE NASAL SPRAY 0.65 % nasal spray SMARTSIG:1 Spray(s) Both Nares Every 4 Hours PRN 10/14/20   [provider]  DULoxetine (CYMBALTA) 60 MG capsule Take by mouth. 11/14/20 11/14/21  [provider]  famotidine (PEPCID) 20 MG tablet Take by mouth. 10/14/20   [provider]  folic acid (FOLVITE) 1 MG tablet  Take by mouth. 03/01/21 03/01/22  [provider]  furosemide (LASIX) 40 MG tablet Take 1 tablet (40 mg total) by mouth 2 (two) times daily. Patient taking differently: Take 40 mg by mouth daily. 07/25/21   Swayze, Ava, DO  insulin aspart (NOVOLOG) 100 UNIT/ML injection Inject 10 Units into the skin 3 (three) times daily with meals. Patient not taking: Reported on 08/08/2021 07/25/21   Swayze, Ava, DO  insulin glargine (LANTUS) 100 UNIT/ML Solostar Pen Inject 40 Units into the skin daily. Patient not taking: Reported on 08/08/2021 07/27/21   Richarda Osmond, MD  lidocaine (LIDODERM) 5 % 1 patch daily as needed. 07/07/21   [provider]  melatonin 3 MG TABS tablet Take 6 mg by mouth at bedtime as needed. 10/14/20   [provider]  metoprolol succinate (TOPROL-XL) 100 MG 24 hr tablet Take by mouth. 01/05/21 01/05/22  [provider]  Multiple Vitamins-Minerals (MULTIVITAMIN WITH MINERALS) tablet Take 1 tablet by mouth daily.    [provider]  nicotine (NICODERM CQ - DOSED IN MG/24 HOURS) 21 mg/24hr patch Place 1 patch (21 mg total) onto the skin daily. 07/26/21   Swayze, Ava, DO  nystatin (MYCOSTATIN/NYSTOP) powder Apply topically 3 (three) times daily. 07/25/21   Swayze, Ava,  DO  Omega-3 Fatty Acids (FISH OIL) 1000 MG CAPS Take by mouth.    [provider]  ondansetron (ZOFRAN) 4 MG tablet Take by mouth. 10/14/20   [provider]  oxyCODONE 10 MG TABS Take 0.5-1 tablets (5-10 mg total) by mouth every 6 (six) hours as needed for moderate pain or severe pain. 07/25/21   Swayze, Ava, DO  polyethylene glycol (MIRALAX / GLYCOLAX) 17 g packet Take 17 g by mouth daily. 07/25/21   Swayze, Ava, DO  potassium chloride 20 MEQ TBCR Take 10 mEq by mouth 2 (two) times daily. 07/25/21   Swayze, Ava, DO  pregabalin (LYRICA) 200 MG capsule Take by mouth. 03/01/21 03/01/22  [provider]  rivaroxaban (XARELTO) 20 MG TABS tablet Take by mouth.  11/07/20   [provider]    Allergies  Allergen Reactions   Amoxicillin Rash    Tolerated cefepime and cefazolin 08/2020.    Family History  Problem Relation Age of Onset   Healthy Mother    Healthy Father     Social History Social History   Tobacco Use   Smoking status: Never   Smokeless tobacco: Current    Types: Chew   Tobacco comments:    Not ready to quit.   Vaping Use   Vaping Use: Never used  Substance Use Topics   Alcohol use: Yes    Comment: pint to a fifth per day per patient . Patient reports he hasn't had anything to drink in three weeks.   Drug use: Never    Review of Systems Unable to obtain adequate [place] First review of systems secondary to altered mental status and respiratory distress.  ____________________________________________   PHYSICAL EXAM:  VITAL SIGNS: ED Triage Vitals [08/09/21 2236]  Enc Vitals Group     BP      Pulse      Resp      Temp      Temp src      SpO2      Weight (!) 311 lb 4.6 oz (141.2 kg)     Height _0  (1.854 m)     Head Circumference      Peak Flow      Pain Score      Pain Loc      Pain Edu?      Excl. in [place]?    Constitutional: Patient is somnolent, not responsive to deep sternal rub.  Not following commands or answering questions. Eyes: 4 mm bilaterally, but sluggishly reactive. ENT      Head: Normocephalic and atraumatic.      Mouth/Throat: Mucous membranes are moist. Cardiovascular: Regular rhythm rate around 120 bpm. Respiratory: Patient's ventilations being assisted with bag-valve-mask upon arrival.  Continue to assist ventilations and intubated shortly after arrival. Gastrointestinal: Soft, obese.  Ostomy present. Musculoskeletal: Diffuse peripheral edema/anasarca.  Right upper extremity PICC line present Neurologic:  omnolent, keeps eyes closed throughout exam, unresponsive to verbal or physical stimuli but will occasionally lift his head off the bed as if to purposely move. Skin:  Skin is  warm, dry and intact.  Psychiatric: Altered/minimally responsive  ____________________________________________    EKG  EKG viewed and interpreted by myself shows sinus tachycardia 143 bpm with a narrow QRS, normal axis, slight QTC prolongation otherwise largely normal intervals with nonspecific ST changes no ST elevation.  ____________________________________________    RADIOLOGY  Chest x-ray shows vascular congestion.  ET tube in satisfactory position.  PICC line in satisfactory position.  ____________________________________________  INITIAL IMPRESSION / ASSESSMENT AND PLAN / ED COURSE  Pertinent labs & imaging results that were available during my care of the patient were reviewed by me and considered in my medical decision making (see chart for details).   Presents emergency department for cute onset of respiratory distress this evening as well as what appears to be 1/32 tonic-clonic seizure with no seizure history.  Per record review patient was discharged from the hospital 07/25/2021 after an admission for acute on chronic hypoxic and hypercarbic respiratory failure found to be sepsis with MSSA bacteremia, has a PICC line placed.  They did note seizure-like activity during his stay as well.  As well as anasarca.  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  History of a perforated diverticulitis status post ostomy.  We will check labs including an ABG, obtain a chest x-ray, CT scan of the head.  We will maintain sedation on propofol while awaiting further work-up.  Patient will ultimately be admitted to the intensive care unit once his emergency department work-up is been completed.  Patient's white blood cell count has resulted at 22,000.  Given the significant leukocytosis tachycardia tachypnea hypoxia we will cover with antibiotics per sepsis protocols.  Remainder the lab work and imaging is pending.  Patient care signed out to oncoming provider.  Craig [name] was  evaluated in Emergency Department on 08/09/2021 for the symptoms described in the history of present illness. He was evaluated in the context of the global COVID-19 pandemic, which necessitated consideration that the patient might be at risk for infection with the SARS-CoV-2 virus that causes COVID-19. Institutional protocols and algorithms that pertain to the evaluation of patients at risk for COVID-19 are in a state of rapid change based on information released by regulatory bodies including the CDC and federal and state organizations. These policies and algorithms were followed during the patient's care in the ED.  INTUBATION Performed by: [name]  Required items: required blood products, implants, devices, and special equipment available Patient identity confirmed: provided demographic data and hospital-assigned identification number Time out: Immediately prior to procedure a "time out" was called to verify the correct patient, procedure, equipment, support staff and site/side marked as required.  Indications:  Respiratory failure  Intubation method: S4 Glidescope Laryngoscopy   Preoxygenation: 100% BVM  Sedatives: 30 mg etomidate Paralytic: 130 mg succinylcholine  Tube Size: 7.5 cuffed  Post-procedure assessment: chest rise and ETCO2 monitor Breath sounds: equal and absent over the epigastrium Tube secured with: ETT holder Chest x-ray interpreted by radiologist and me.  Chest x-ray findings: endotracheal tube in appropriate position  Patient tolerated the procedure well with no immediate complications.  CRITICAL CARE Performed by: [name]   Total critical care time: 45 minutes  Critical care time was exclusive of separately billable procedures and treating other patients.  Critical care was necessary to treat or prevent imminent or life-threatening deterioration.  Critical care was time spent personally by me on the following activities: development of  treatment plan with patient and/or surrogate as well as nursing, discussions with consultants, evaluation of patient's response to treatment, examination of patient, obtaining history from patient or surrogate, ordering and performing treatments and interventions, ordering and review of laboratory studies, ordering and review of radiographic studies, pulse oximetry and re-evaluation of patient's condition.   ____________________________________________   FINAL CLINICAL IMPRESSION(S) / ED DIAGNOSES  Respiratory distress Acute respiratory failure   [name], MD 08/09/21 2820    [name], MD 08/09/21 2326

## 2021-08-09 NOTE — Progress Notes (Signed)
PHARMACY -  BRIEF ANTIBIOTIC NOTE   Pharmacy has received consult(s) for Vanc, Aztreonam from an ED provider.  The patient's profile has been reviewed for ht/wt/allergies/indication/available labs.    One time order(s) placed for Vancomycin 2500 mg IV X 1 and Aztreonam 2 gm IV X 1   Further antibiotics/pharmacy consults should be ordered by admitting physician if indicated.                       Thank you, Kaushik Maul D 08/09/2021  11:19 PM

## 2021-08-10 ENCOUNTER — Inpatient Hospital Stay: Payer: Medicaid Other

## 2021-08-10 ENCOUNTER — Emergency Department: Payer: Medicaid Other

## 2021-08-10 DIAGNOSIS — J440 Chronic obstructive pulmonary disease with acute lower respiratory infection: Secondary | ICD-10-CM | POA: Diagnosis present

## 2021-08-10 DIAGNOSIS — D649 Anemia, unspecified: Secondary | ICD-10-CM | POA: Diagnosis present

## 2021-08-10 DIAGNOSIS — G4733 Obstructive sleep apnea (adult) (pediatric): Secondary | ICD-10-CM | POA: Diagnosis present

## 2021-08-10 DIAGNOSIS — Z6841 Body Mass Index (BMI) 40.0 and over, adult: Secondary | ICD-10-CM | POA: Diagnosis not present

## 2021-08-10 DIAGNOSIS — R6520 Severe sepsis without septic shock: Secondary | ICD-10-CM | POA: Diagnosis present

## 2021-08-10 DIAGNOSIS — J9621 Acute and chronic respiratory failure with hypoxia: Secondary | ICD-10-CM | POA: Diagnosis present

## 2021-08-10 DIAGNOSIS — J9601 Acute respiratory failure with hypoxia: Secondary | ICD-10-CM | POA: Diagnosis not present

## 2021-08-10 DIAGNOSIS — A419 Sepsis, unspecified organism: Secondary | ICD-10-CM | POA: Diagnosis not present

## 2021-08-10 DIAGNOSIS — M00061 Staphylococcal arthritis, right knee: Secondary | ICD-10-CM | POA: Diagnosis present

## 2021-08-10 DIAGNOSIS — Z515 Encounter for palliative care: Secondary | ICD-10-CM | POA: Diagnosis not present

## 2021-08-10 DIAGNOSIS — A4101 Sepsis due to Methicillin susceptible Staphylococcus aureus: Secondary | ICD-10-CM | POA: Diagnosis present

## 2021-08-10 DIAGNOSIS — J9622 Acute and chronic respiratory failure with hypercapnia: Secondary | ICD-10-CM | POA: Diagnosis present

## 2021-08-10 DIAGNOSIS — Z597 Insufficient social insurance and welfare support: Secondary | ICD-10-CM | POA: Diagnosis not present

## 2021-08-10 DIAGNOSIS — Z7952 Long term (current) use of systemic steroids: Secondary | ICD-10-CM | POA: Diagnosis not present

## 2021-08-10 DIAGNOSIS — R0603 Acute respiratory distress: Secondary | ICD-10-CM | POA: Diagnosis present

## 2021-08-10 DIAGNOSIS — M6284 Sarcopenia: Secondary | ICD-10-CM | POA: Diagnosis present

## 2021-08-10 DIAGNOSIS — F419 Anxiety disorder, unspecified: Secondary | ICD-10-CM | POA: Diagnosis present

## 2021-08-10 DIAGNOSIS — J1282 Pneumonia due to coronavirus disease 2019: Secondary | ICD-10-CM | POA: Diagnosis present

## 2021-08-10 DIAGNOSIS — J15211 Pneumonia due to Methicillin susceptible Staphylococcus aureus: Secondary | ICD-10-CM | POA: Diagnosis present

## 2021-08-10 DIAGNOSIS — Z20822 Contact with and (suspected) exposure to covid-19: Secondary | ICD-10-CM | POA: Diagnosis present

## 2021-08-10 DIAGNOSIS — Z720 Tobacco use: Secondary | ICD-10-CM | POA: Diagnosis not present

## 2021-08-10 DIAGNOSIS — M064 Inflammatory polyarthropathy: Secondary | ICD-10-CM | POA: Diagnosis present

## 2021-08-10 DIAGNOSIS — Z7901 Long term (current) use of anticoagulants: Secondary | ICD-10-CM | POA: Diagnosis not present

## 2021-08-10 DIAGNOSIS — G9341 Metabolic encephalopathy: Secondary | ICD-10-CM | POA: Diagnosis present

## 2021-08-10 DIAGNOSIS — E662 Morbid (severe) obesity with alveolar hypoventilation: Secondary | ICD-10-CM | POA: Diagnosis present

## 2021-08-10 DIAGNOSIS — R509 Fever, unspecified: Secondary | ICD-10-CM | POA: Diagnosis not present

## 2021-08-10 DIAGNOSIS — E876 Hypokalemia: Secondary | ICD-10-CM | POA: Diagnosis present

## 2021-08-10 DIAGNOSIS — E119 Type 2 diabetes mellitus without complications: Secondary | ICD-10-CM | POA: Diagnosis present

## 2021-08-10 DIAGNOSIS — I119 Hypertensive heart disease without heart failure: Secondary | ICD-10-CM | POA: Diagnosis present

## 2021-08-10 DIAGNOSIS — Z7189 Other specified counseling: Secondary | ICD-10-CM | POA: Diagnosis not present

## 2021-08-10 LAB — BASIC METABOLIC PANEL
Anion gap: 8 (ref 5–15)
Anion gap: 8 (ref 5–15)
BUN: 6 mg/dL (ref 6–20)
BUN: <5 mg/dL — ABNORMAL LOW (ref 6–20)
CO2: 33 mmol/L — ABNORMAL HIGH (ref 22–32)
CO2: 33 mmol/L — ABNORMAL HIGH (ref 22–32)
Calcium: 9.9 mg/dL (ref 8.9–10.3)
Calcium: 9.9 mg/dL (ref 8.9–10.3)
Chloride: 96 mmol/L — ABNORMAL LOW (ref 98–111)
Chloride: 100 mmol/L (ref 98–111)
Creatinine, Ser: 0.37 mg/dL — ABNORMAL LOW (ref 0.61–1.24)
Creatinine, Ser: 0.37 mg/dL — ABNORMAL LOW (ref 0.61–1.24)
GFR, Estimated: >60 mL/min (ref >60)
GFR, Estimated: >60 mL/min (ref >60)
Glucose, Bld: 106 mg/dL — ABNORMAL HIGH (ref 70–99)
Glucose, Bld: 163 mg/dL — ABNORMAL HIGH (ref 70–99)
Potassium: 2.5 mmol/L — CL (ref 3.5–5.1)
Potassium: 3.9 mmol/L (ref 3.5–5.1)
Sodium: 137 mmol/L (ref 135–145)
Sodium: 141 mmol/L (ref 135–145)

## 2021-08-10 LAB — FUNGAL ORGANISM REFLEX

## 2021-08-10 LAB — GLUCOSE, CAPILLARY
Glucose-Capillary: 99 mg/dL (ref 70–99)
Glucose-Capillary: 94 mg/dL (ref 70–99)
Glucose-Capillary: 157 mg/dL — ABNORMAL HIGH (ref 70–99)
Glucose-Capillary: 161 mg/dL — ABNORMAL HIGH (ref 70–99)
Glucose-Capillary: 164 mg/dL — ABNORMAL HIGH (ref 70–99)
Glucose-Capillary: 160 mg/dL — ABNORMAL HIGH (ref 70–99)

## 2021-08-10 LAB — URINE DRUG SCREEN, QUALITATIVE (ARMC ONLY)
Amphetamines, Ur Screen: NOT DETECTED
Barbiturates, Ur Screen: NOT DETECTED
Benzodiazepine, Ur Scrn: NOT DETECTED
Cannabinoid 50 Ng, Ur ~~LOC~~: NOT DETECTED
Cocaine Metabolite,Ur ~~LOC~~: NOT DETECTED
MDMA (Ecstasy)Ur Screen: NOT DETECTED
Methadone Scn, Ur: NOT DETECTED
Opiate, Ur Screen: NOT DETECTED
Phencyclidine (PCP) Ur S: NOT DETECTED
Tricyclic, Ur Screen: NOT DETECTED

## 2021-08-10 LAB — PHOSPHORUS
Phosphorus: 2.4 mg/dL — ABNORMAL LOW (ref 2.5–4.6)
Phosphorus: 3.2 mg/dL (ref 2.5–4.6)

## 2021-08-10 LAB — PROCALCITONIN: Procalcitonin: 0.27 ng/mL

## 2021-08-10 LAB — MAGNESIUM
Magnesium: 1.3 mg/dL — ABNORMAL LOW (ref 1.7–2.4)
Magnesium: 1.7 mg/dL (ref 1.7–2.4)

## 2021-08-10 LAB — LACTIC ACID, PLASMA
Lactic Acid, Venous: 2 mmol/L (ref 0.5–1.9)
Lactic Acid, Venous: 2 mmol/L (ref 0.5–1.9)
Lactic Acid, Venous: 2.4 mmol/L (ref 0.5–1.9)

## 2021-08-10 LAB — FUNGUS CULTURE WITH STAIN

## 2021-08-10 LAB — CBC
HCT: 29.2 % — ABNORMAL LOW (ref 39.0–52.0)
Hemoglobin: 8.5 g/dL — ABNORMAL LOW (ref 13.0–17.0)
MCH: 27.2 pg (ref 26.0–34.0)
MCHC: 29.1 g/dL — ABNORMAL LOW (ref 30.0–36.0)
MCV: 93.6 fL (ref 80.0–100.0)
Platelets: 187 10*3/uL (ref 150–400)
RBC: 3.12 MIL/uL — ABNORMAL LOW (ref 4.22–5.81)
RDW: 17.8 % — ABNORMAL HIGH (ref 11.5–15.5)
WBC: 9.9 10*3/uL (ref 4.0–10.5)
nRBC: 0 % (ref 0.0–0.2)

## 2021-08-10 LAB — FUNGUS CULTURE RESULT

## 2021-08-10 LAB — CBG MONITORING, ED: Glucose-Capillary: 208 mg/dL — ABNORMAL HIGH (ref 70–99)

## 2021-08-10 LAB — TROPONIN I (HIGH SENSITIVITY): Troponin I (High Sensitivity): 34 ng/L — ABNORMAL HIGH (ref <18)

## 2021-08-10 IMAGING — CT CT HEAD W/O CM
4 series · 16 of 47 positions shown, 18 images · non-contrast
Comparison: [DATE]

CLINICAL DATA: Status post seizure.

EXAM:
CT HEAD WITHOUT CONTRAST
TECHNIQUE: Contiguous axial images were obtained from the base of the skull
through the vertex without intravenous contrast.

[Series 2: head bone · axial · 0.47mm/px · z∈[+527,+583]mm · 4 of 84 slices shown]
[im 8/84  bone]
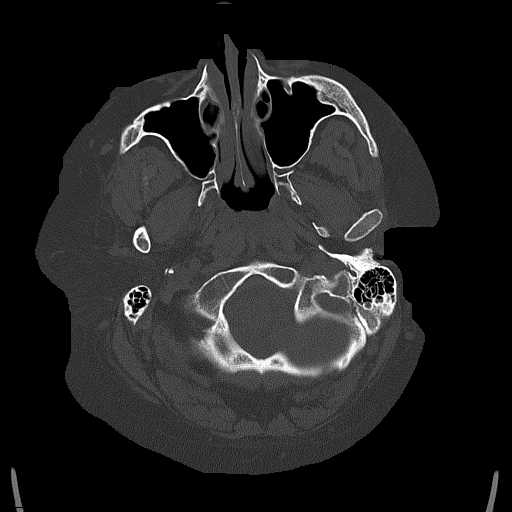
[im 16/84  bone]
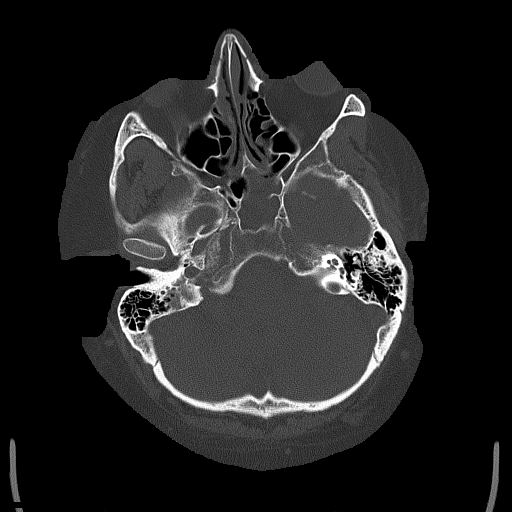
[im 28/84  bone]
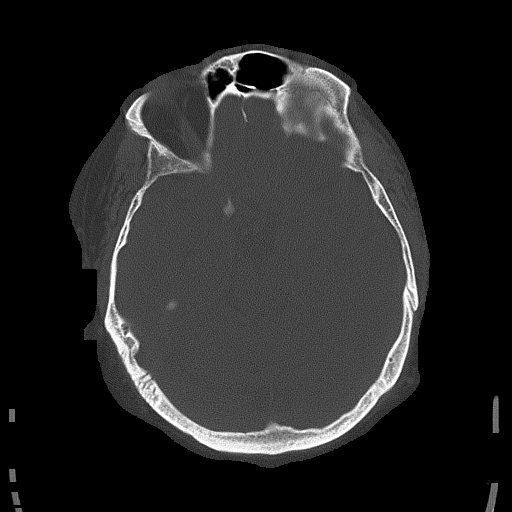
[im 36/84  bone]
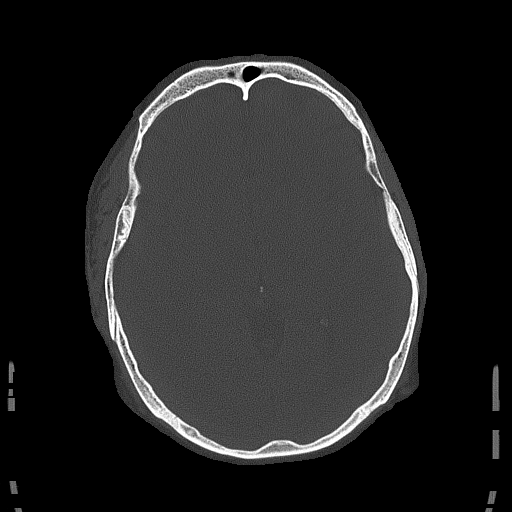

[Series 3: head wo · axial · 0.47mm/px · z∈[+533,+648]mm · 6 of 33 slices shown, 8 images]
[im 5/33  brain]
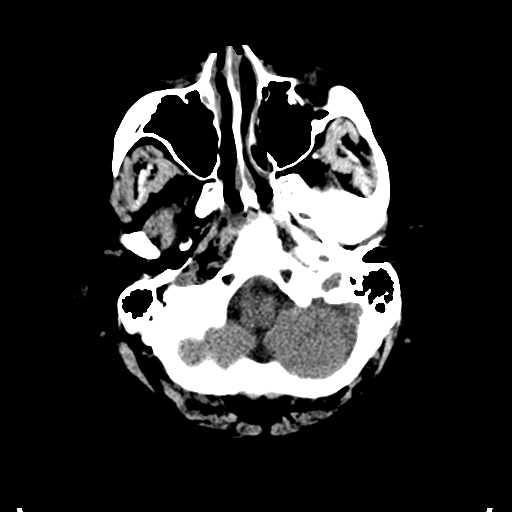
[im 5/33  bone]
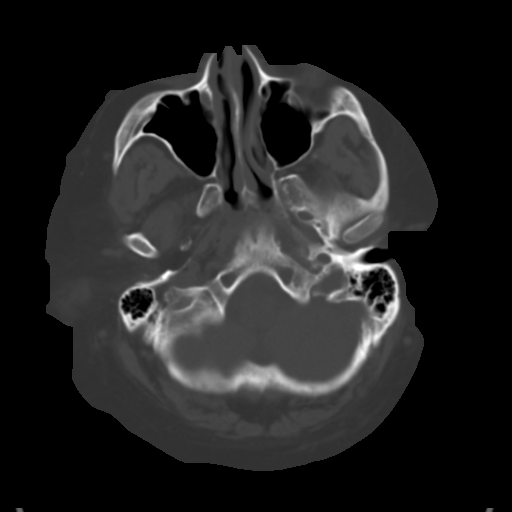
[im 10/33  brain]
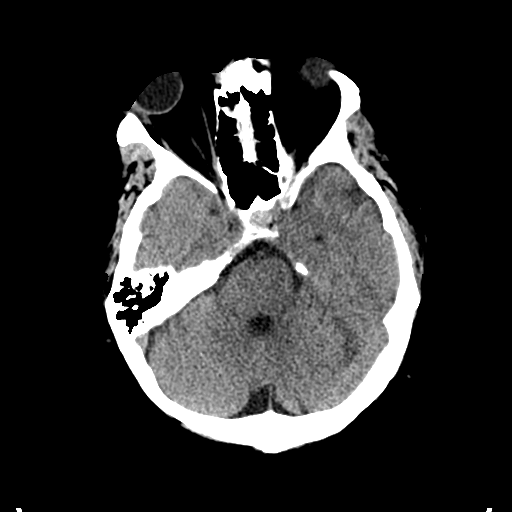
[im 14/33  brain]
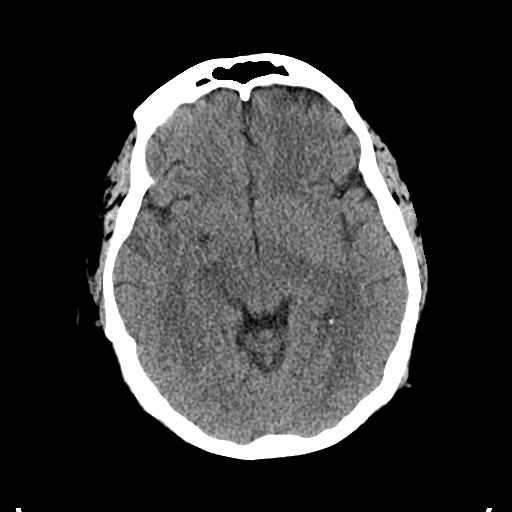
[im 19/33  brain]
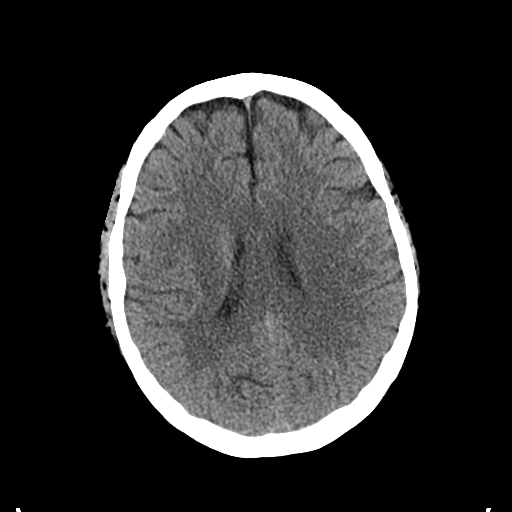
[im 23/33  brain]
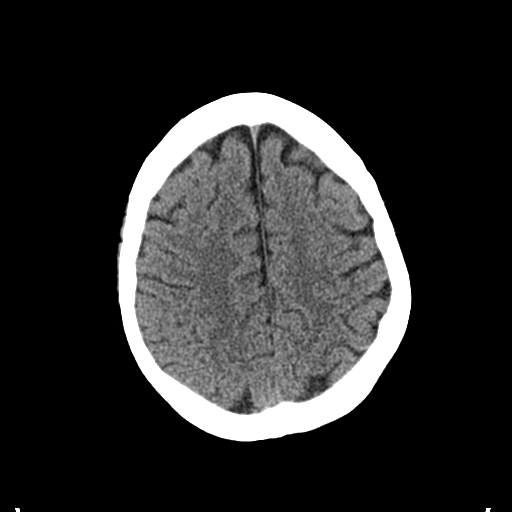
[im 23/33  bone]
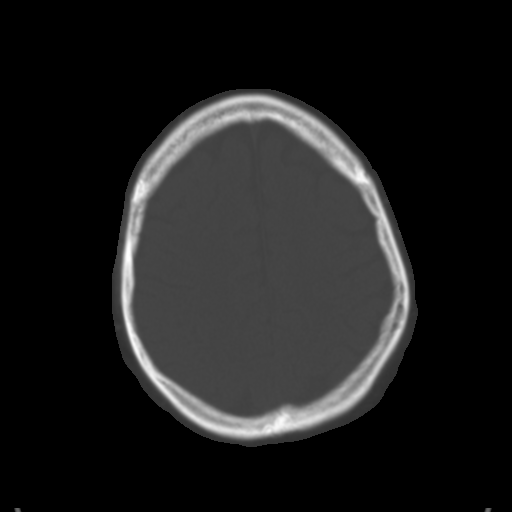
[im 28/33  brain]
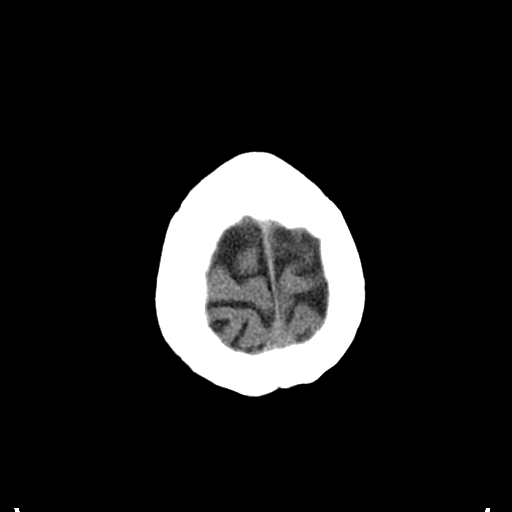

[Series 4: coronal soft tissue · coronal · 0.31mm/px · 3 of 71 slices shown]
[im 24/71  brain]
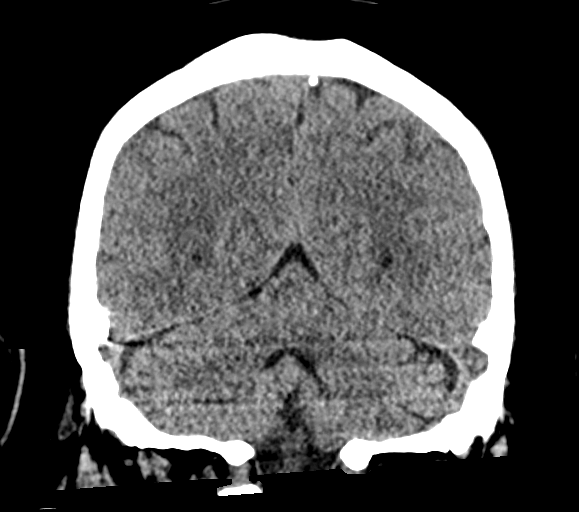
[im 32/71  brain]
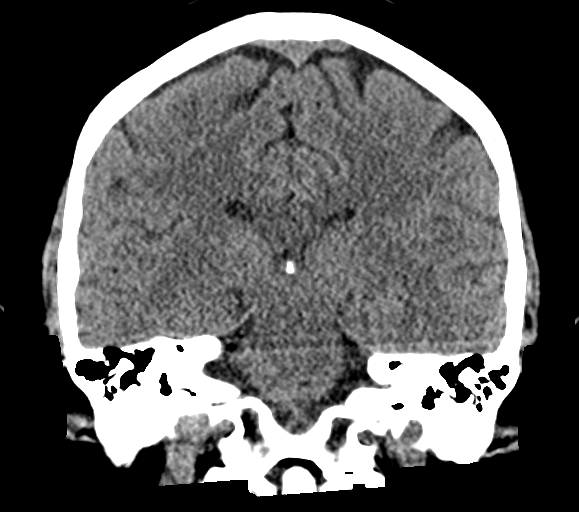
[im 39/71  brain]
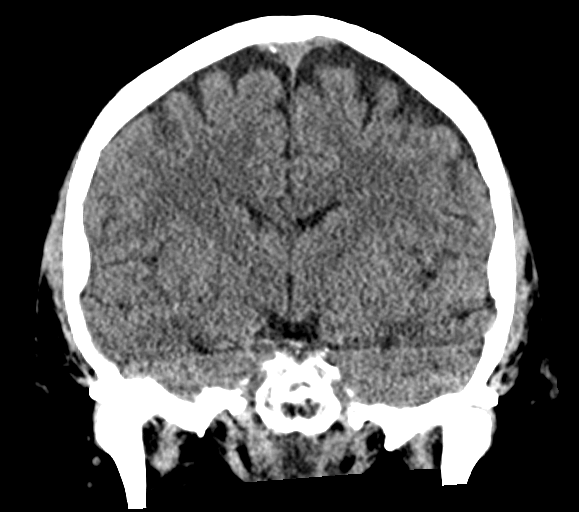

[Series 5: sagittal soft tissue · sagittal · 0.31mm/px · 3 of 66 slices shown]
[im 22/66  brain]
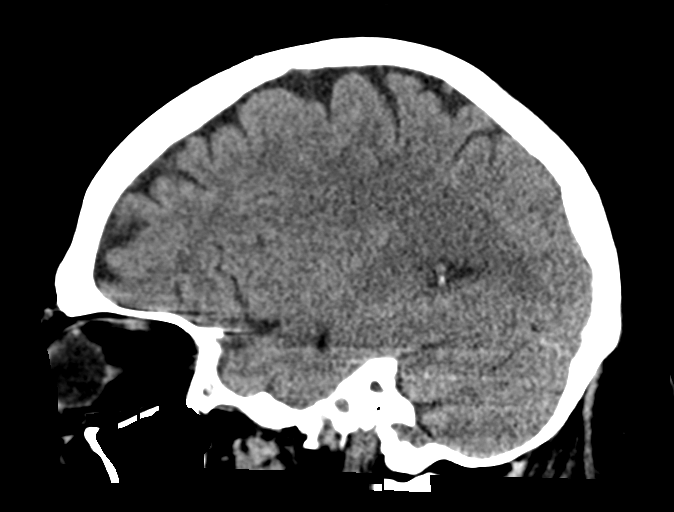
[im 33/66  brain]
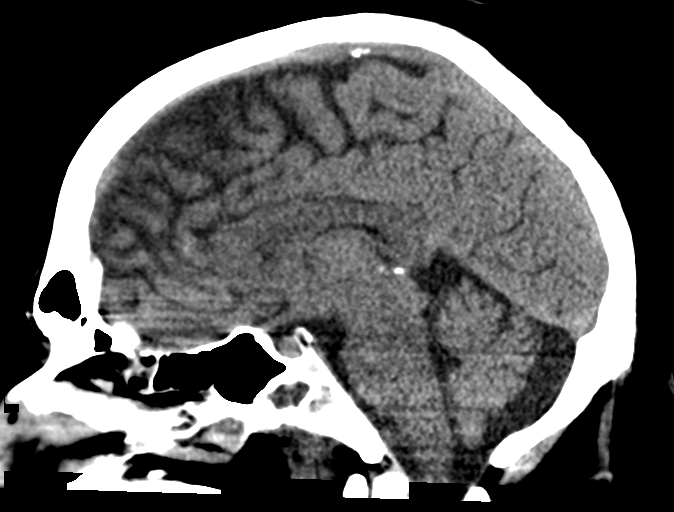
[im 44/66  brain]
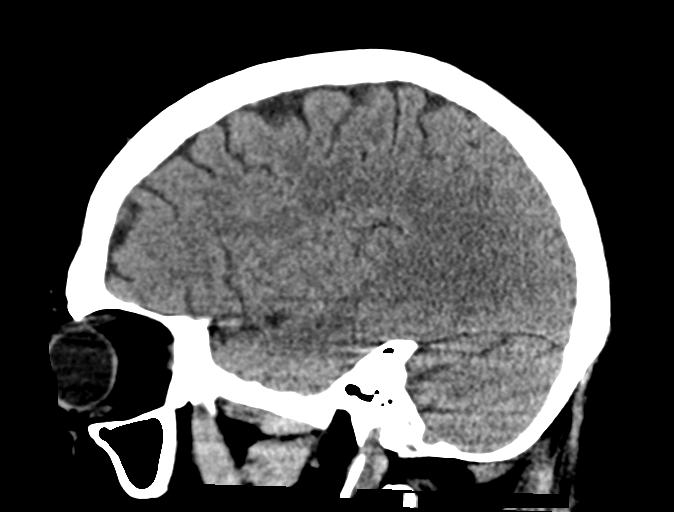

[16 of 47 positions shown; findings below may reference images not displayed]

FINDINGS: Brain: No evidence of acute infarction, hemorrhage, hydrocephalus,
extra-axial collection or mass lesion/mass effect.

Vascular: No hyperdense vessel or unexpected calcification.

Skull: Normal. Negative for fracture or focal lesion.

Sinuses/Orbits: There is mild bilateral ethmoid sinus and marked
severity sphenoid sinus mucosal thickening

Other: None.
IMPRESSION: 1. No acute intracranial pathology.
2. Mild bilateral ethmoid sinus and marked severity sphenoid sinus
disease.

## 2021-08-10 IMAGING — DX DG ABDOMEN 1V
1 series · 1 of 1 positions shown · non-contrast
Comparison: [DATE]

CLINICAL DATA: OG tube placement

EXAM:
ABDOMEN - 1 VIEW

[abdomen supine]
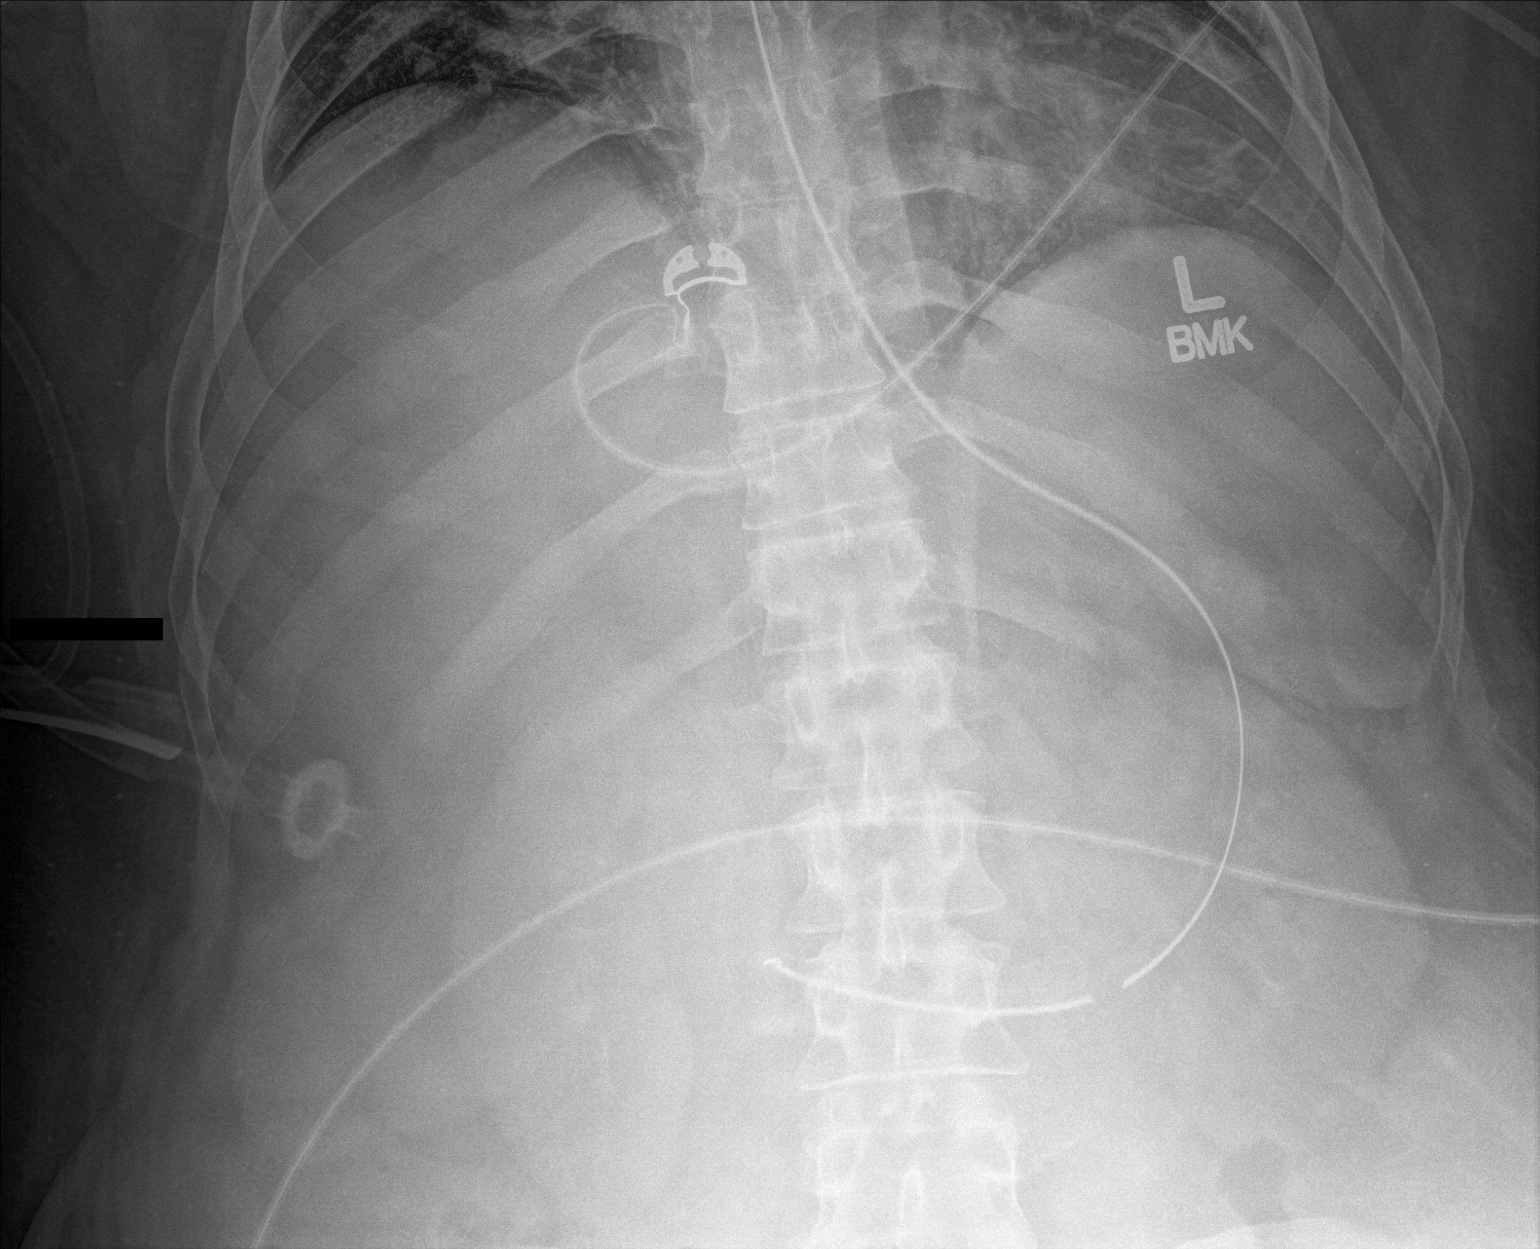

[1 of 1 positions shown; findings below may reference images not displayed]

FINDINGS: OG tube tip is in the distal stomach. Paucity of gas throughout the
abdomen.
IMPRESSION: OG tube in the distal stomach

## 2021-08-10 IMAGING — CT CT ANGIO CHEST
2 of 7 series · 18 of 46 positions shown · IV contrast (APPLIED)
Comparison: [DATE]

CLINICAL DATA: Shortness of breath.

EXAM:
CT ANGIOGRAPHY CHEST WITH CONTRAST
TECHNIQUE: Multidetector CT imaging of the chest was performed using the
standard protocol during bolus administration of intravenous
contrast. Multiplanar CT image reconstructions and MIPs were
obtained to evaluate the vascular anatomy.
CONTRAST:  100mL OMNIPAQUE IOHEXOL 350 MG/ML SOLN

[Series 5: thins · axial · 0.81mm/px · z∈[+96,+388]mm · 15 of 408 slices shown]
[im 21/408  lung]
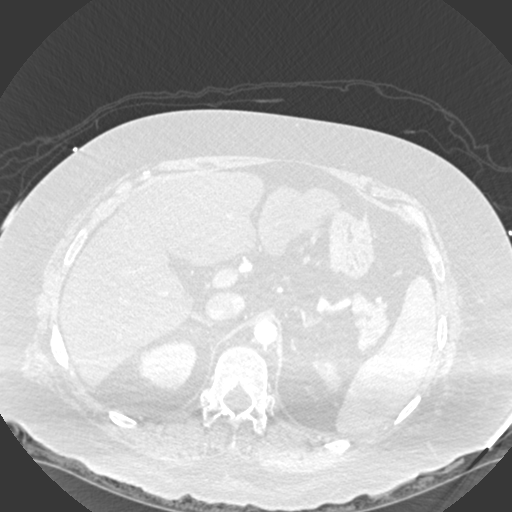
[im 41/408  soft-tissue]
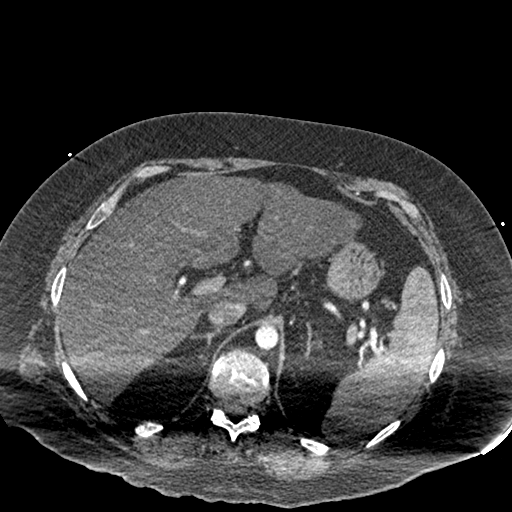
[im 82/408  lung]
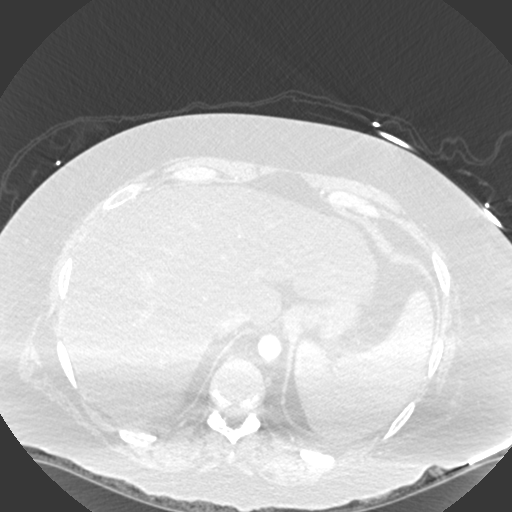
[im 102/408  soft-tissue]
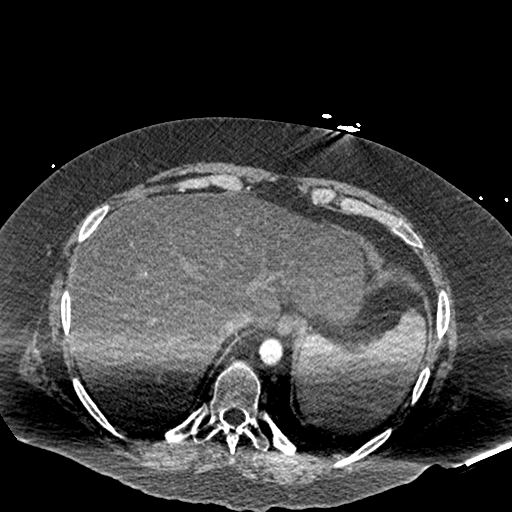
[im 123/408  lung]
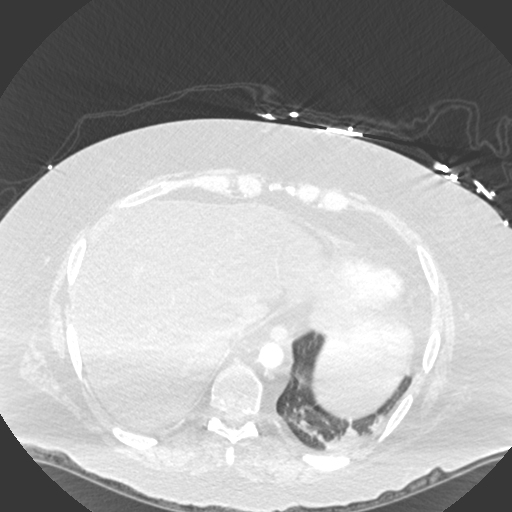
[im 143/408  soft-tissue]
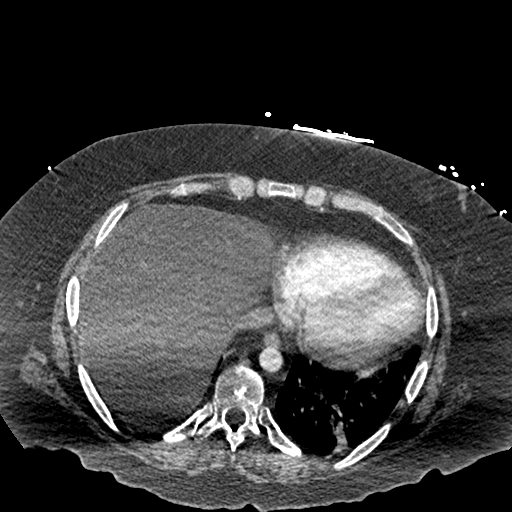
[im 184/408  lung]
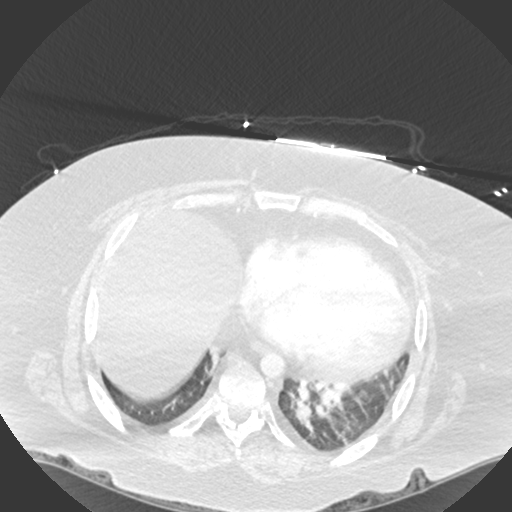
[im 204/408  soft-tissue]
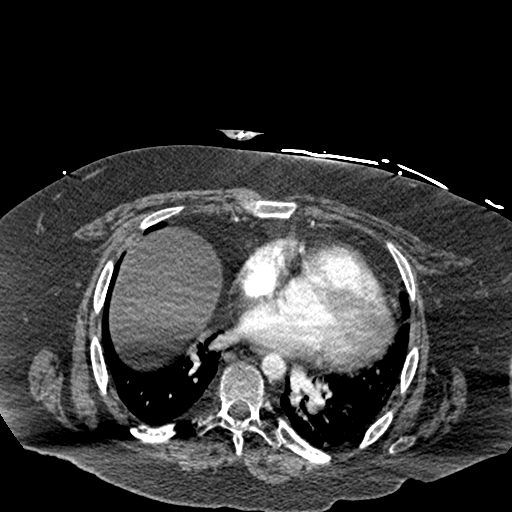
[im 224/408  lung]
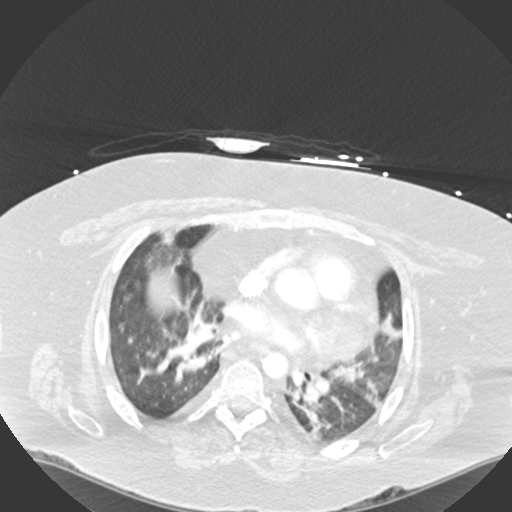
[im 265/408  soft-tissue]
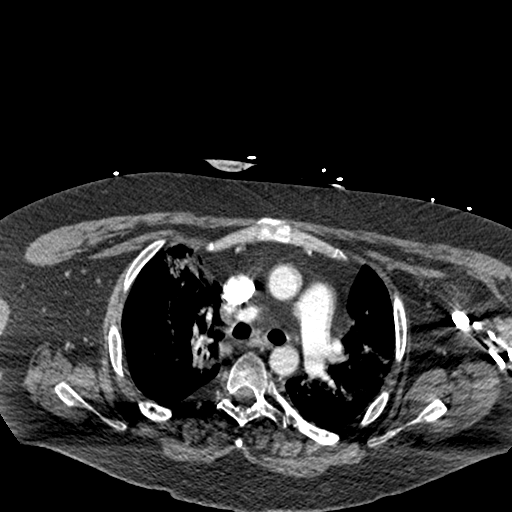
[im 285/408  lung]
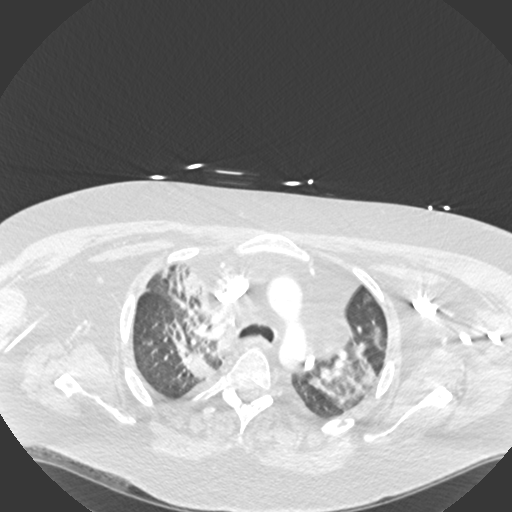
[im 306/408  soft-tissue]
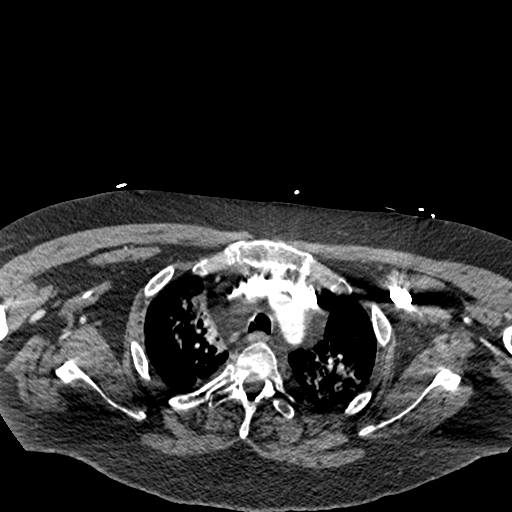
[im 326/408  lung]
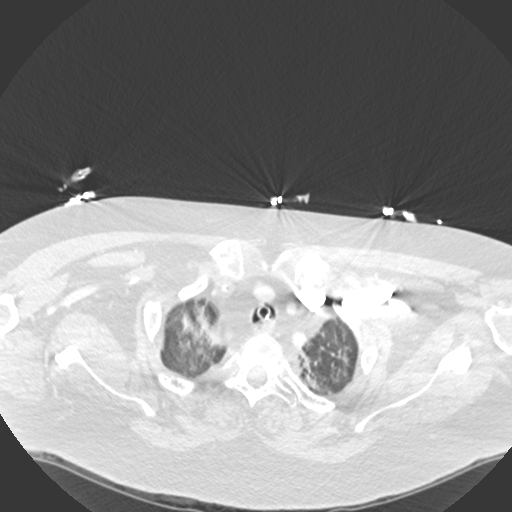
[im 367/408  soft-tissue]
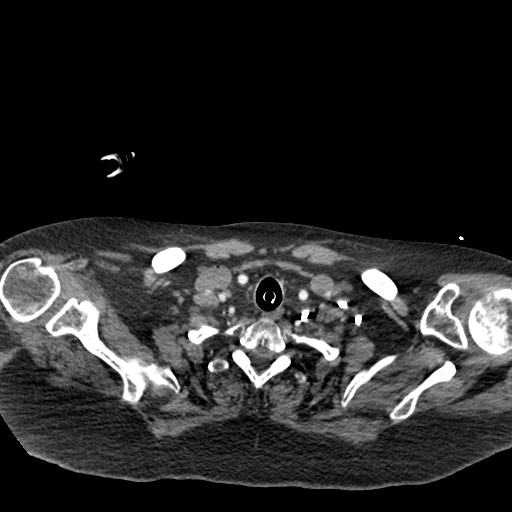
[im 387/408  lung]
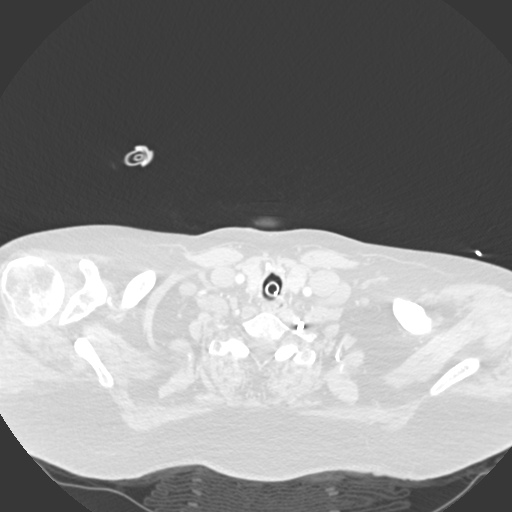

[Series 7: coronal mpr · coronal · 0.60mm/px · 3 of 103 slices shown]
[im 26/103  soft-tissue]
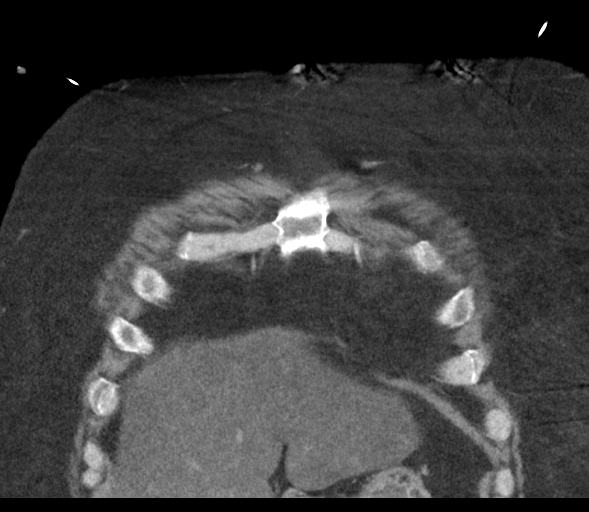
[im 52/103  soft-tissue]
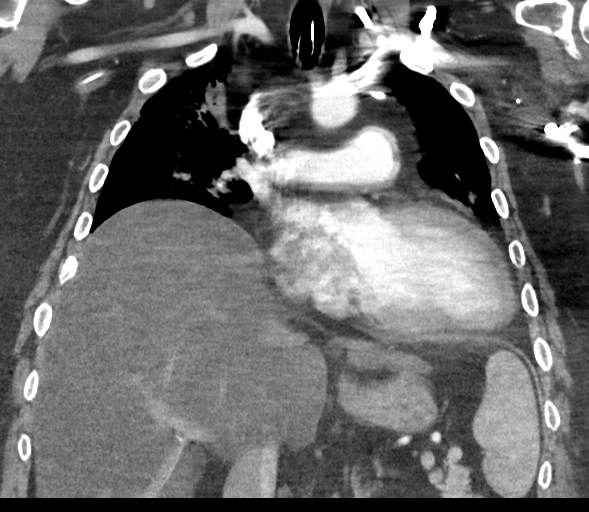
[im 77/103  soft-tissue]
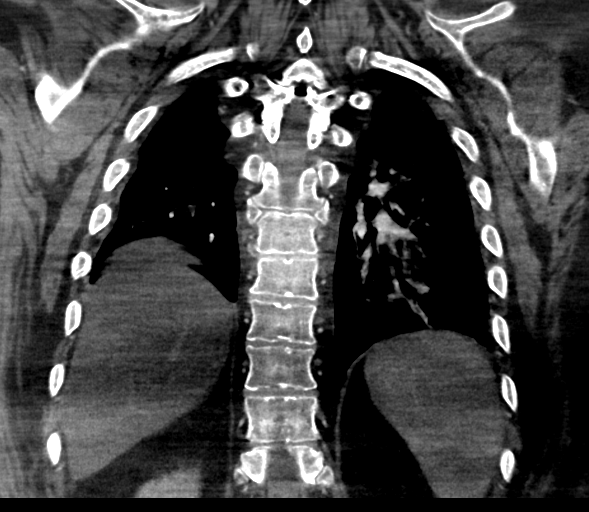

[18 of 46 positions shown; findings below may reference images not displayed]

FINDINGS: Cardiovascular: The subsegmental pulmonary arteries are limited in
evaluation secondary to overlying beam hardening artifact. No
evidence of pulmonary embolism. Normal heart size. No pericardial
effusion.

Mediastinum/Nodes: No enlarged mediastinal, hilar, or axillary lymph
nodes. Thyroid gland, trachea, and esophagus demonstrate no
significant findings.

Lungs/Pleura: Endotracheal tube is seen.

Stable, moderate to marked severity consolidative atelectatic
changes are seen within the bilateral upper lobes and left lower
lobe.

There is no evidence of a pleural effusion or pneumothorax.

Upper Abdomen: There is stable elevation of the right hemidiaphragm.

Diffuse fatty infiltration of the liver parenchyma is noted.

Musculoskeletal: No chest wall abnormality. No acute or significant
osseous findings.

Review of the MIP images confirms the above findings.
IMPRESSION: 1. No evidence of pulmonary embolism.
2. Stable, moderate to marked severity bilateral upper lobe and left
lower lobe consolidative atelectatic changes.
3. Hepatic steatosis.

## 2021-08-10 MED ORDER — IOHEXOL 350 MG/ML SOLN
100.0000 mL | Freq: Once | INTRAVENOUS | Status: AC | PRN
  Administered 2021-08-10: 01:00:00 100 mL via INTRAVENOUS

## 2021-08-10 MED ORDER — PANTOPRAZOLE SODIUM 40 MG IV SOLR
40.0000 mg | Freq: Every day | INTRAVENOUS | Status: DC
  Administered 2021-08-10 – 2021-08-19 (×10): 40 mg via INTRAVENOUS
  Filled 2021-08-10 (×10): qty 40

## 2021-08-10 MED ORDER — POTASSIUM CHLORIDE 10 MEQ/100ML IV SOLN
10.0000 meq | INTRAVENOUS | Status: AC
  Administered 2021-08-10 (×2): 10 meq via INTRAVENOUS
  Filled 2021-08-10 (×2): qty 100

## 2021-08-10 MED ORDER — DOCUSATE SODIUM 100 MG PO CAPS: 100.0000 mg | ORAL_CAPSULE | Freq: Two times a day (BID) | ORAL | Status: DC | PRN

## 2021-08-10 MED ORDER — MAGNESIUM SULFATE 2 GM/50ML IV SOLN
2.0000 g | Freq: Once | INTRAVENOUS | Status: AC
  Administered 2021-08-10: 09:00:00 2 g via INTRAVENOUS
  Filled 2021-08-10: qty 50

## 2021-08-10 MED ORDER — DOCUSATE SODIUM 50 MG/5ML PO LIQD
100.0000 mg | Freq: Two times a day (BID) | ORAL | Status: DC
  Administered 2021-08-10 (×2): 100 mg
  Filled 2021-08-10 (×4): qty 10

## 2021-08-10 MED ORDER — POLYETHYLENE GLYCOL 3350 17 G PO PACK: 17.0000 g | PACK | Freq: Every day | ORAL | Status: DC | PRN

## 2021-08-10 MED ORDER — CHLORHEXIDINE GLUCONATE CLOTH 2 % EX PADS
6.0000 | MEDICATED_PAD | Freq: Every day | CUTANEOUS | Status: DC
  Administered 2021-08-11 – 2021-08-23 (×8): 6 via TOPICAL

## 2021-08-10 MED ORDER — ORAL CARE MOUTH RINSE
15.0000 mL | OROMUCOSAL | Status: DC
  Administered 2021-08-10 – 2021-08-15 (×44): 15 mL via OROMUCOSAL

## 2021-08-10 MED ORDER — MIDAZOLAM HCL 2 MG/2ML IJ SOLN
2.0000 mg | INTRAMUSCULAR | Status: DC | PRN
  Administered 2021-08-11: 06:00:00 2 mg via INTRAVENOUS

## 2021-08-10 MED ORDER — FENTANYL BOLUS VIA INFUSION
50.0000 ug | INTRAVENOUS | Status: DC | PRN
  Administered 2021-08-10 (×2): 75 ug via INTRAVENOUS
  Administered 2021-08-11: 04:00:00 100 ug via INTRAVENOUS
  Filled 2021-08-10: qty 100

## 2021-08-10 MED ORDER — CEFAZOLIN SODIUM-DEXTROSE 2-4 GM/100ML-% IV SOLN
2.0000 g | Freq: Three times a day (TID) | INTRAVENOUS | Status: DC
  Administered 2021-08-10 – 2021-08-15 (×16): 2 g via INTRAVENOUS
  Filled 2021-08-10 (×17): qty 100

## 2021-08-10 MED ORDER — MAGNESIUM SULFATE 2 GM/50ML IV SOLN
2.0000 g | Freq: Once | INTRAVENOUS | Status: AC
  Administered 2021-08-10: 18:00:00 2 g via INTRAVENOUS
  Filled 2021-08-10: qty 50

## 2021-08-10 MED ORDER — HEPARIN SODIUM (PORCINE) 5000 UNIT/ML IJ SOLN
5000.0000 [IU] | Freq: Three times a day (TID) | INTRAMUSCULAR | Status: DC
  Administered 2021-08-10 – 2021-08-11 (×5): 5000 [IU] via SUBCUTANEOUS
  Filled 2021-08-10 (×5): qty 1

## 2021-08-10 MED ORDER — LEVETIRACETAM IN NACL 500 MG/100ML IV SOLN
500.0000 mg | Freq: Two times a day (BID) | INTRAVENOUS | Status: DC
  Administered 2021-08-10 – 2021-08-11 (×2): 500 mg via INTRAVENOUS
  Filled 2021-08-10 (×2): qty 100

## 2021-08-10 MED ORDER — NOREPINEPHRINE 4 MG/250ML-% IV SOLN
0.0000 ug/min | INTRAVENOUS | Status: DC
  Filled 2021-08-10: qty 500

## 2021-08-10 MED ORDER — NOREPINEPHRINE 4 MG/250ML-% IV SOLN
INTRAVENOUS | Status: AC
  Administered 2021-08-10: 04:00:00 2 ug/min via INTRAVENOUS
  Filled 2021-08-10: qty 250

## 2021-08-10 MED ORDER — LEVETIRACETAM IN NACL 1500 MG/100ML IV SOLN
1500.0000 mg | Freq: Once | INTRAVENOUS | Status: AC
  Administered 2021-08-10: 04:00:00 1500 mg via INTRAVENOUS
  Filled 2021-08-10: qty 100

## 2021-08-10 MED ORDER — IPRATROPIUM-ALBUTEROL 0.5-2.5 (3) MG/3ML IN SOLN: 3.0000 mL | Freq: Four times a day (QID) | RESPIRATORY_TRACT | Status: DC | PRN

## 2021-08-10 MED ORDER — POTASSIUM CHLORIDE 20 MEQ PO PACK
40.0000 meq | PACK | Freq: Two times a day (BID) | ORAL | Status: DC
  Administered 2021-08-10 (×2): 40 meq
  Filled 2021-08-10 (×2): qty 2

## 2021-08-10 MED ORDER — CHLORHEXIDINE GLUCONATE 0.12 % MT SOLN
OROMUCOSAL | Status: AC
  Administered 2021-08-10: 08:00:00 15 mL via OROMUCOSAL
  Filled 2021-08-10: qty 15

## 2021-08-10 MED ORDER — FENTANYL 2500MCG IN NS 250ML (10MCG/ML) PREMIX INFUSION
50.0000 ug/h | INTRAVENOUS | Status: DC
  Administered 2021-08-10: 21:00:00 150 ug/h via INTRAVENOUS
  Administered 2021-08-10 – 2021-08-11 (×2): 50 ug/h via INTRAVENOUS
  Administered 2021-08-12: 14:00:00 75 ug/h via INTRAVENOUS
  Filled 2021-08-10 (×4): qty 250

## 2021-08-10 MED ORDER — PROPOFOL 1000 MG/100ML IV EMUL
5.0000 ug/kg/min | INTRAVENOUS | Status: DC
  Administered 2021-08-10 (×2): 80 ug/kg/min via INTRAVENOUS
  Administered 2021-08-10: 21:00:00 60 ug/kg/min via INTRAVENOUS
  Administered 2021-08-10: 18:00:00 55 ug/kg/min via INTRAVENOUS
  Administered 2021-08-10: 09:00:00 80 ug/kg/min via INTRAVENOUS
  Administered 2021-08-10: 13:00:00 55 ug/kg/min via INTRAVENOUS
  Administered 2021-08-10: 60 ug/kg/min via INTRAVENOUS
  Administered 2021-08-10: 03:00:00 80 ug/kg/min via INTRAVENOUS
  Administered 2021-08-10: 16:00:00 55 ug/kg/min via INTRAVENOUS
  Administered 2021-08-10: 04:00:00 80 ug/kg/min via INTRAVENOUS
  Administered 2021-08-10: 20:00:00 70 ug/kg/min via INTRAVENOUS
  Administered 2021-08-10: 11:00:00 55 ug/kg/min via INTRAVENOUS
  Administered 2021-08-11 (×2): 60 ug/kg/min via INTRAVENOUS
  Filled 2021-08-10 (×4): qty 200
  Filled 2021-08-10 (×6): qty 100

## 2021-08-10 MED ORDER — BUDESONIDE 0.25 MG/2ML IN SUSP
0.2500 mg | Freq: Two times a day (BID) | RESPIRATORY_TRACT | Status: DC
  Administered 2021-08-10 – 2021-08-23 (×27): 0.25 mg via RESPIRATORY_TRACT
  Filled 2021-08-10 (×27): qty 2

## 2021-08-10 MED ORDER — METHYLPREDNISOLONE SODIUM SUCC 40 MG IJ SOLR
20.0000 mg | Freq: Two times a day (BID) | INTRAMUSCULAR | Status: DC
  Administered 2021-08-10 – 2021-08-12 (×5): 20 mg via INTRAVENOUS
  Filled 2021-08-10 (×5): qty 1

## 2021-08-10 MED ORDER — PROPOFOL 1000 MG/100ML IV EMUL
INTRAVENOUS | Status: AC
  Filled 2021-08-10: qty 100

## 2021-08-10 MED ORDER — CHLORHEXIDINE GLUCONATE 0.12% ORAL RINSE (MEDLINE KIT)
15.0000 mL | Freq: Two times a day (BID) | OROMUCOSAL | Status: DC
  Administered 2021-08-10 – 2021-08-13 (×7): 15 mL via OROMUCOSAL

## 2021-08-10 MED ORDER — FUROSEMIDE 10 MG/ML IJ SOLN
20.0000 mg | Freq: Two times a day (BID) | INTRAMUSCULAR | Status: DC
  Administered 2021-08-10 (×2): 20 mg via INTRAVENOUS
  Filled 2021-08-10 (×2): qty 2

## 2021-08-10 MED ORDER — POTASSIUM CHLORIDE 10 MEQ/100ML IV SOLN
10.0000 meq | INTRAVENOUS | Status: AC
  Administered 2021-08-10 (×2): 10 meq via INTRAVENOUS
  Filled 2021-08-10 (×2): qty 100

## 2021-08-10 MED ORDER — POLYETHYLENE GLYCOL 3350 17 G PO PACK
17.0000 g | PACK | Freq: Every day | ORAL | Status: DC
  Administered 2021-08-10: 10:00:00 17 g
  Filled 2021-08-10 (×2): qty 1

## 2021-08-10 MED ORDER — INSULIN ASPART 100 UNIT/ML IJ SOLN
0.0000 [IU] | INTRAMUSCULAR | Status: DC
  Administered 2021-08-10 – 2021-08-11 (×6): 4 [IU] via SUBCUTANEOUS
  Administered 2021-08-11: 09:00:00 3 [IU] via SUBCUTANEOUS
  Administered 2021-08-11: 13:00:00 4 [IU] via SUBCUTANEOUS
  Administered 2021-08-11 – 2021-08-12 (×2): 3 [IU] via SUBCUTANEOUS
  Administered 2021-08-12 (×5): 4 [IU] via SUBCUTANEOUS
  Administered 2021-08-12: 7 [IU] via SUBCUTANEOUS
  Administered 2021-08-13: 04:00:00 4 [IU] via SUBCUTANEOUS
  Administered 2021-08-13 – 2021-08-16 (×10): 3 [IU] via SUBCUTANEOUS
  Administered 2021-08-17: 20:00:00 4 [IU] via SUBCUTANEOUS
  Administered 2021-08-17 (×2): 3 [IU] via SUBCUTANEOUS
  Administered 2021-08-17 (×2): 4 [IU] via SUBCUTANEOUS
  Administered 2021-08-17 – 2021-08-18 (×3): 3 [IU] via SUBCUTANEOUS
  Administered 2021-08-18: 20:00:00 4 [IU] via SUBCUTANEOUS
  Administered 2021-08-18: 12:00:00 3 [IU] via SUBCUTANEOUS
  Administered 2021-08-18: 16:00:00 7 [IU] via SUBCUTANEOUS
  Administered 2021-08-19: 4 [IU] via SUBCUTANEOUS
  Administered 2021-08-19: 2 [IU] via SUBCUTANEOUS
  Administered 2021-08-19: 4 [IU] via SUBCUTANEOUS
  Administered 2021-08-19 – 2021-08-20 (×5): 3 [IU] via SUBCUTANEOUS
  Administered 2021-08-20: 4 [IU] via SUBCUTANEOUS
  Administered 2021-08-20: 3 [IU] via SUBCUTANEOUS
  Administered 2021-08-21: 4 [IU] via SUBCUTANEOUS
  Filled 2021-08-10 (×52): qty 1

## 2021-08-10 NOTE — Consult Note (Signed)
PHARMACY CONSULT NOTE  Pharmacy Consult for Electrolyte Monitoring and Replacement   Recent Labs: Potassium (mmol/L)  Date Value  08/10/2021 3.9   Magnesium (mg/dL)  Date Value  08/10/2021 1.7   Calcium (mg/dL)  Date Value  08/10/2021 9.9   Albumin (g/dL)  Date Value  08/09/2021 3.1 (L)   Phosphorus (mg/dL)  Date Value  08/10/2021 3.2   Sodium (mmol/L)  Date Value  08/10/2021 141     Assessment: 30 year-old male with PMH of inflammatory arthritis, chronic hypoxic and hypercarpneic respiratory failure on 2L O2 PRN, MSSA bacteremia, MSSA pneumonia, perforated diverticulitis s/p partial colectomy with ostomy creation, morbid obesity and gout who was admitted to the hospital for SOB and was subsequently intubated. Pharmacy was consulted for electrolyte monitoring.   diuretics: furosemide 20mg  IV BID  MIVF: lactated ringers at 155mL/hr   Goal of Therapy:  Electrolytes WNL  Plan:  2 grams IV magnesium sulfate x 1 Recheck electrolytes in am  continue to monitor and replace electrolytes as clinically indicated    Dallie Piles ,PharmD Clinical Pharmacist 08/10/2021 3:34 PM

## 2021-08-10 NOTE — Progress Notes (Signed)
Sepsis tracking by e[employer] 

## 2021-08-10 NOTE — Progress Notes (Signed)
Initial Nutrition Assessment  DOCUMENTATION CODES:   Morbid obesity  INTERVENTION:   -If unable to extubate within 24-48 hours, recommend initiation of enteral nutrition support:  Initiate Vital High Protein @ 20 ml/hr via OGT and increase by 10 ml every 4 hours to goal rate of 40 ml/hr. (960 ml daily)  90 ml Prostat QID.  30 ml free water flush every 4 hours for tube patency    Tube feeding regimen provides 1280 kcal, 171 grams of protein, and 803 ml of H2O. Total free water: 983 ml daily  TF + propofol to provides 2510 kcals daily. Due to high propofol rate, unable to meet protein needs without exceeding calorie needs.  NUTRITION DIAGNOSIS:   Inadequate oral intake related to inability to eat as evidenced by NPO status.  GOAL:   Provide needs based on ASPEN/SCCM guidelines  MONITOR:   Vent status, Labs, Weight trends, Skin, I & O's  REASON FOR ASSESSMENT:   Ventilator    ASSESSMENT:   30 y.o with significant PMH of of extensive medical history including inflammatory arthritis on methotrexate and chronic prednisone at 10 mg daily, prior severe Covid-19 infection requiring prolonged hospitalization and tracheostomy (since decannulated) Dec 2021 - Feb 2022, chronic hypoxic and hypercarpneic respiratory failure on 2L O2 PRN, MSSA bacteremia, MSSA pneumonia, perforated diverticulitis s/p partial colectomy with ostomy creation, morbid obesity and gout who presented to the ED with chief complaints of acute onset shortness of breath.  Pt admitted with acute on chronic respiratory failure with probable pneumonia vs anxiety and sepsis secondary to MSSA bacteremia.   Patient is currently intubated on ventilator support. OGT connected to low, intermittent suction MV: 9.6 L/min Temp (24hrs), Avg:97.9 F (36.6 C), Min:97 F (36.1 C), Max:99.1 F (37.3 C)  Propofol: 46.6 ml/hr (provides 1230 kcals daily)  Reviewed I/O's: +4.7 L x 24 hours  UOP: 1 L x 24 hours  Case  discussed with MD, RN, and during ICU rounds. Plan to rule out seizures, however, staff and wife doubtful of seizures.    Per RN, pt with multiple admissions and general decline in health over the past year, after an extended hospital stay with COVID-19. Pt familiar to this RD, due to recent hospitalization. No family at bedside at time of visit.   RN does not suspect pt will be ventilated long. Case discussed with PCCM NP; plan to hold off on TF for now, but may consider if pt remains intubated.   Reviewed wt hx; noted progressive wt gain over the past year. Suspect some wt gain may be related to edema.   Medications reviewed and include colace, lasix, solu-medrol, miralax, potassium chloride, lactated ringers infusion @ 150 ml/hr, keppra, and levophed.   Lab Results  Component Value Date   HGBA1C 6.8 (H) 07/08/2021   PTA DM medications are 10 units insulin aspart TID with meals and 40 units insulin glargine daily.   Labs reviewed: CBGS: 94-161 (inpatient orders for glycemic control are 0-20 units insulin aspart every 4 hours).    NUTRITION - FOCUSED PHYSICAL EXAM:  Flowsheet Row Most Recent Value  Orbital Region No depletion  Upper Arm Region No depletion  Thoracic and Lumbar Region No depletion  Buccal Region No depletion  Temple Region No depletion  Clavicle Bone Region No depletion  Clavicle and Acromion Bone Region No depletion  Scapular Bone Region No depletion  Dorsal Hand No depletion  Patellar Region No depletion  Anterior Thigh Region No depletion  Posterior Calf Region No depletion  Edema (RD Assessment) Moderate  Hair Reviewed  Eyes Reviewed  Mouth Reviewed  Skin Reviewed  Nails Reviewed       Diet Order:   Diet Order             Diet NPO time specified  Diet effective now                   EDUCATION NEEDS:   Not appropriate for education at this time  Skin:  Skin Integrity Issues:: Stage II, Incisions Stage II: coccyx Incisions: closed rt  knee  Last BM:  08/10/21 (via colostomy)  Height:   Ht Readings from Last 1 Encounters:  08/09/21 6\' 1"  (1.854 m)    Weight:   Wt Readings from Last 1 Encounters:  08/10/21 (!) 141.5 kg    Ideal Body Weight:  83.6 kg  BMI:  Body mass index is 41.16 kg/m.  Estimated Nutritional Needs:   Kcal:  4585-9292  Protein:  > 167 grams  Fluid:  > 1.8 L    Loistine Chance, RD, LDN, [place] Registered Dietitian II Certified Diabetes Care and Education Specialist Please refer to [facility] for RD and/or RD on-call/weekend/after hours pager

## 2021-08-10 NOTE — Consult Note (Addendum)
PHARMACY CONSULT NOTE - FOLLOW UP  Pharmacy Consult for Electrolyte Monitoring and Replacement   Recent Labs: Potassium (mmol/L)  Date Value  08/10/2021 2.5 (LL)   Magnesium (mg/dL)  Date Value  08/10/2021 1.3 (L)   Calcium (mg/dL)  Date Value  08/10/2021 9.9   Albumin (g/dL)  Date Value  08/09/2021 3.1 (L)   Phosphorus (mg/dL)  Date Value  08/10/2021 2.4 (L)   Sodium (mmol/L)  Date Value  08/10/2021 137     Assessment: 30 year-old male with PMH of inflammatory arthritis, chronic hypoxic and hypercarpneic respiratory failure on 2L O2 PRN, MSSA bacteremia, MSSA pneumonia, perforated diverticulitis s/p partial colectomy with ostomy creation, morbid obesity and gout who was admitted to the hospital for SOB and was subsequently intubated. Pharmacy was consulted for electrolyte monitoring.   Furosemide 20mg  IV BID 12/22> LR @150mL /hr 12/22>  Labs Na 134 K 2.5 Phos 2.4 Mg 1.3  Goal of Therapy:  Electrolytes WNL  Plan:  --Order was placed for 28mEq Kcl packets BID x2 and 20mEq Kcl IV q1h x2 doses by MD, but given degree of deficiency an additional order for Kcl 80mEq q1h x2 doses was placed  --Order for 2g Mg sulfate was placed by MD --Follow up labs placed for 1400 to guide additional replacement needs  --Will continue to monitor and replace electrolytes as clinically indicated    Narda Rutherford ,PharmD Clinical Pharmacist 08/10/2021 1:13 PM

## 2021-08-10 NOTE — Progress Notes (Signed)
Eeg done 

## 2021-08-10 NOTE — Consult Note (Signed)
NAME: Craig [name]  DOB: 01-Apr-1991  MRN: [id number]  Date/Time: 08/10/2021 4:23 PM  REQUESTING PROVIDER: Rufina Falco Subjective:  REASON FOR CONSULT: septic knee ? Craig [name] is a 30 y.o. male with a history of COVID asscoiated resp disase long term, OSA, COPD, Resp faliure Presents with acute onset SOB- as per wife he was trying to be lifted  to the bed by the hoist lift when he felt he couldn't get his breath and started to panic, and could not breathe- Apparently EMS noted convulsing movt and  As per family he has never had seizure- prior to the toni- clonic seizure his oxygen sats were 85% and EMS had to bag him. Vitals in the ED Bp 126/70, temp 98.6, pulse 112, WBC 22.6, HB 10.3, Plt 368, cr 0.63. blood culture sent and he was given a dose of vanco aztreonam and flagyl. Ct head no acute pathology- CT chest b/l atelectasis  I am asked to see the patient for the septic knee and need for further antibiotics  Pt has a complicated history. He was recently in [facility] for rt knee swelling and pain and diagnsed with MSSA bacteremia and MSSA rt knee septic arthritis- HE was sent home on cefazolin for 4 weeks and his last dose is today. He is to start oral cefadroxil following that.     He was initially admitted on 11/22/2019 with diverticulitis/colon perforation and underwent emergent partial colectomy with end colostomy.  He was readmitted in November 2021 underwent colostomy takedown robotic lap was attempted but was converted to laparotomy.,  Small bowel resection and parastomal hernia repair on 06/28/2020.  He was discharged on 07/03/2020.  He was readmitted on 07/08/2020 with sepsis and CT showed pneumoperitoneum.  He underwent laparotomy and partial colectomy with colostomy on 07/08/2020 and was discharged on 07/14/2020. HE was diagnosed with COVID in dec 2021 and was hospitalized on 08/09/20 . During that time he was intubated , had MSSA bacteremia and pneumonia, had PEG,  tracheostomy and sent to [facility] on IV cefazolin for a total of 6 weeks. 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  He saw a rheumatologist on 02/06/2021. For right knee pain.  He has a history of gout in the right knee was injected with bupivacaine and Kenalog.  He was placed on oral prednisone. Other abnormalities were B12 deficiency, vitamin D deficiency, IgM monoclonal protein deviation with lambda light chain, elevated sed rate 51 and CRP and low titer anti-CCP antibodies but other serologies were negative. HE saw rheumatologist Dr.Kernodle on 07/03/21 and he suspected septic arthritis which Xray confirmed and aspiration was dry tap. HE was planning to get him admitted to hospital for arthroscopy when he presented last time to the ED  Pt is currently intubated and in the ICU on propafol, fentanyl and low dose pressor  Past Medical History:  Diagnosis Date   Gout    Hypertension    Rupture of bowel [facility])     Past Surgical History:  Procedure Laterality Date   COLONOSCOPY WITH PROPOFOL N/A 05/18/2020   Procedure: COLONOSCOPY WITH PROPOFOL;  Surgeon: Benjamine Sprague, DO;  Location: [facility] ENDOSCOPY;  Service: General;  Laterality: N/A;   COLOSTOMY N/A 11/22/2019   Procedure: COLOSTOMY;  Surgeon: Herbert Pun, MD;  Location: [facility] ORS;  Service:  General;  Laterality: N/A;   KNEE ARTHROSCOPY Right 07/11/2021   Procedure: SYNOVIAL BIOPSY AND ARTHROSCOPIC IRRGATION AND DEBRIDEMENT OF SEPTIC KNEE;  Surgeon: Hessie Knows, MD;  Location: [facility] ORS;  Service: Orthopedics;  Laterality: Right;   KNEE ARTHROSCOPY Right 07/18/2021   Procedure: ARTHROSCOPY KNEE, IRRIGATION AND DEBRIDEMENT;  Surgeon: Hessie Knows, MD;  Location: [facility] ORS;  Service: Orthopedics;  Laterality: Right;    LAPAROTOMY N/A 11/22/2019   Procedure: EXPLORATORY LAPAROTOMY;  Surgeon: Herbert Pun, MD;  Location: [facility] ORS;  Service: General;  Laterality: N/A;   LAPAROTOMY N/A 07/08/2020   Procedure: EXPLORATORY LAPAROTOMY;  Surgeon: Herbert Pun, MD;  Location: [facility] ORS;  Service: General;  Laterality: N/A;   PARTIAL COLECTOMY N/A 11/22/2019   Procedure: PARTIAL COLECTOMY;  Surgeon: Herbert Pun, MD;  Location: [facility] ORS;  Service: General;  Laterality: N/A;   PEG PLACEMENT N/A 09/14/2020   Procedure: PERCUTANEOUS ENDOSCOPIC GASTROSTOMY (PEG) PLACEMENT;  Surgeon: Benjamine Sprague, DO;  Location: [place] [facility];  Service: General;  Laterality: N/A;  TRAVEL CASE [place] Right 07/11/2021   Procedure: SYNOVIAL BIOPSY;  Surgeon: Hessie Knows, MD;  Location: [facility] ORS;  Service: Orthopedics;  Laterality: Right;   TRACHEOSTOMY TUBE PLACEMENT N/A 09/13/2020   Procedure: TRACHEOSTOMY;  Surgeon: Beverly Gust, MD;  Location: [facility] ORS;  Service: ENT;  Laterality: N/A;   XI ROBOTIC ASSISTED COLOSTOMY TAKEDOWN N/A 06/28/2020   Procedure: XI ROBOTIC ASSISTED COLOSTOMY TAKEDOWN CONVERTED TO OPEN PROCEDURE;  Surgeon: Herbert Pun, MD;  Location: [facility] ORS;  Service: General;  Laterality: N/A;    Social History   Socioeconomic History   Marital status: Married    Spouse name: Not on file   Number of children: Not on file   Years of education: Not on file   Highest education level: Not on file  Occupational History   Not on file  Tobacco Use   Smoking status: Never   Smokeless tobacco: Current    Types: Chew   Tobacco comments:    Not ready to quit.   Vaping Use   Vaping Use: Never used  Substance and Sexual Activity   Alcohol use: Yes    Comment: pint to a fifth per day per patient . Patient reports he hasn't had anything to drink in three weeks.   Drug use: Never   Sexual activity: Yes    Birth control/protection: None  Other Topics Concern    Not on file  Social History Narrative   Not on file   Social Determinants of Health   Financial Resource Strain: Not on file  Food Insecurity: Not on file  Transportation Needs: Not on file  Physical Activity: Not on file  Stress: Not on file  Social Connections: Not on file  Intimate Partner Violence: Not on file    Family History  Problem Relation Age of Onset   Healthy Mother    Healthy Father    Allergies  Allergen Reactions   Tramadol Hcl     SEIZURES!!! DO NOT REORDER AT DISCHARGE!!!   Amoxicillin Rash    Tolerated cefepime and cefazolin 08/2020.   I? Current Facility-Administered Medications  Medication Dose Route Frequency Provider Last Rate Last Admin   budesonide (PULMICORT) nebulizer solution 0.25 mg  0.25 mg Nebulization BID Lang Snow, NP   0.25 mg at 08/10/21 0818   ceFAZolin (ANCEF) IVPB 2g/100 mL premix  2 g Intravenous Q8H Yatzary Merriweather, Joellyn Quails, MD 200 mL/hr at 08/10/21 1431 2 g at 08/10/21 1431  chlorhexidine gluconate (MEDLINE KIT) (PERIDEX) 0.12 % solution 15 mL  15 mL Mouth Rinse BID [name], MD   15 mL at 08/10/21 0820   Chlorhexidine Gluconate Cloth 2 % PADS 6 each  6 each Topical Daily [name], MD       docusate (COLACE) 50 MG/5ML liquid 100 mg  100 mg Per Tube BID Lang Snow, NP   100 mg at 08/10/21 0930   fentaNYL (SUBLIMAZE) bolus via infusion 50-100 mcg  50-100 mcg Intravenous Q1H PRN Lang Snow, NP       fentaNYL 254mg in NS 253m(1070mml) infusion-PREMIX  50-200 mcg/hr Intravenous Continuous OumLang SnowP 12.5 mL/hr at 08/10/21 1200 125 mcg/hr at 08/10/21 1200   furosemide (LASIX) injection 20 mg  20 mg Intravenous BID OumLang SnowP   20 mg at 08/10/21 0857   heparin injection 5,000 Units  5,000 Units Subcutaneous Q8H OumLang SnowP   5,000 Units at 08/10/21 1427   insulin aspart (novoLOG) injection 0-20 Units  0-20 Units Subcutaneous Q4H OumLang SnowP   4 Units at 08/10/21 1246   ipratropium-albuterol (DUONEB) 0.5-2.5 (3) MG/3ML nebulizer solution 3 mL  3 mL Nebulization Q6H PRN OumLang SnowP       lactated ringers infusion   Intravenous Continuous PadHarvest DarkD 150 mL/hr at 08/10/21 1200 Infusion Verify at 08/10/21 1200   levETIRAcetam (KEPPRA) IVPB 500 mg/100 mL premix  500 mg Intravenous Q12H OumLang SnowP 400 mL/hr at 08/10/21 1623 500 mg at 08/10/21 1623   magnesium sulfate IVPB 2 g 50 mL  2 g Intravenous Once [facility]Dallie PilesPHMercy Franklin Center    MEDLINE mouth rinse  15 mL Mouth Rinse 10 times per day KasFlora LippsD   15 mL at 08/10/21 1432   methylPREDNISolone sodium succinate (SOLU-MEDROL) 40 mg/mL injection 20 mg  20 mg Intravenous Q12H OumLang SnowP   20 mg at 08/10/21 0537   midazolam (VERSED) injection 2 mg  2 mg Intravenous Q2H PRN OumLang SnowP       norepinephrine (LEVOPHED) 4mg24m 250mL81m016 mg/mL) premix infusion  0-40 mcg/min Intravenous Titrated Ouma,Lang Snow15 mL/hr at 08/10/21 0436 4 mcg/min at 08/10/21 0436   pantoprazole (PROTONIX) injection 40 mg  40 mg Intravenous QHS Ouma,Lang Snow      polyethylene glycol (MIRALAX / GLYCOLAX) packet 17 g  17 g Per Tube Daily Ouma,Lang Snow  17 g at 08/10/21 0930   potassium chloride (KLOR-CON) packet 40 mEq  40 mEq Per Tube BID Ouma,Lang Snow  40 mEq at 08/10/21 0930   propofol (DIPRIVAN) 1000 MG/100ML infusion  5-80 mcg/kg/min Intravenous Continuous Ouma,Lang Snow46.6 mL/hr at 08/10/21 1538 55 mcg/kg/min at 08/10/21 1538   vancomycin (VANCOREADY) IVPB 500 mg/100 mL  500 mg Intravenous Once PaducHarvest Dark        Abtx:  Anti-infectives (From admission, onward)    Start     Dose/Rate Route Frequency Ordered Stop   08/10/21 1200  ceFAZolin (ANCEF) IVPB 2g/100 mL premix        2 g 200 mL/hr over 30 Minutes Intravenous Every 8 hours  08/10/21 0916     08/09/21 2330  vancomycin (VANCOREADY) IVPB 2000 mg/400 mL       See Hyperspace for full Linked Orders Report.   2,000 mg 200 mL/hr over  120 Minutes Intravenous  Once 08/09/21 2319 08/10/21 0438   08/09/21 2330  vancomycin (VANCOREADY) IVPB 500 mg/100 mL       See Hyperspace for full Linked Orders Report.   500 mg 100 mL/hr over 60 Minutes Intravenous  Once 08/09/21 2319     08/09/21 2315  aztreonam (AZACTAM) 2 g in sodium chloride 0.9 % 100 mL IVPB        2 g 200 mL/hr over 30 Minutes Intravenous  Once 08/09/21 2313 08/10/21 0010   08/09/21 2315  metroNIDAZOLE (FLAGYL) IVPB 500 mg        500 mg 100 mL/hr over 60 Minutes Intravenous  Once 08/09/21 2313 08/10/21 0116   08/09/21 2315  vancomycin (VANCOCIN) IVPB 1000 mg/200 mL premix  Status:  Discontinued        1,000 mg 200 mL/hr over 60 Minutes Intravenous  Once 08/09/21 2313 08/09/21 2318       REVIEW OF SYSTEMS:  NA Objective:  VITALS:  BP (!) 98/52    Pulse 76    Temp 98.8 F (37.1 C)    Resp 18    Ht '6\' 1"'  (1.854 m)    Wt (!) 141.5 kg    SpO2 100%    BMI 41.16 kg/m  PHYSICAL EXAM:  General: intubated and sedated, obese Head: Normocephalic, without obvious abnormality, atraumatic. Eyes: Conjunctivae clear, anicteric sclerae. Pupils are equal ENT cannot be examined Neck:  symmetrical, no adenopathy, thyroid: non tender no carotid bruit and no JVD. Lungs: b/l air entry Heart: s1s2 Abdomen: Soft, colostomy foley Extremities: lower extremities- edematous Skin: bruises- b/l feet healing blister Lymph: Cervical, supraclavicular normal. Neurologic: cannot be assessed Pertinent Labs Lab Results CBC    Component Value Date/Time   WBC 9.9 08/10/2021 0339   RBC 3.12 (L) 08/10/2021 0339   HGB 8.5 (L) 08/10/2021 0339   HCT 29.2 (L) 08/10/2021 0339   PLT 187 08/10/2021 0339   MCV 93.6 08/10/2021 0339   MCH 27.2 08/10/2021 0339   MCHC 29.1 (L) 08/10/2021 0339   RDW 17.8 (H) 08/10/2021 0339   LYMPHSABS  1.6 07/23/2021 0441   MONOABS 1.0 07/23/2021 0441   EOSABS 0.1 07/23/2021 0441   BASOSABS 0.0 07/23/2021 0441    CMP Latest Ref Rng & Units 08/10/2021 08/10/2021 08/09/2021  Glucose 70 - 99 mg/dL 163(H) 106(H) 259(H)  BUN 6 - 20 mg/dL <5(L) 6 6  Creatinine 0.61 - 1.24 mg/dL 0.37(L) 0.37(L) 0.63  Sodium 135 - 145 mmol/L 141 137 136  Potassium 3.5 - 5.1 mmol/L 3.9 2.5(LL) 4.3  Chloride 98 - 111 mmol/L 100 96(L) 94(L)  CO2 22 - 32 mmol/L 33(H) 33(H) 36(H)  Calcium 8.9 - 10.3 mg/dL 9.9 9.9 10.0  Total Protein 6.5 - 8.1 g/dL - - 7.8  Total Bilirubin 0.3 - 1.2 mg/dL - - 0.9  Alkaline Phos 38 - 126 U/L - - 70  AST 15 - 41 U/L - - 57(H)  ALT 0 - 44 U/L - - 8      Microbiology: Recent Results (from the past 240 hour(s))  Blood culture (routine x 2)     Status: None (Preliminary result)   Collection Time: 08/09/21 10:47 PM   Specimen: BLOOD RIGHT HAND  Result Value Ref Range Status   Specimen Description BLOOD RIGHT HAND  Final   Special Requests   Final    BOTTLES DRAWN AEROBIC AND ANAEROBIC Blood Culture adequate volume   Culture   Final    NO GROWTH < 12  HOURS Performed at [facility], [address] [place], [place] [address]    Report Status PENDING  Incomplete  Resp Panel by RT-PCR (Flu A&B, Covid) Nasopharyngeal Swab     Status: None   Collection Time: 08/09/21 10:48 PM   Specimen: Nasopharyngeal Swab; Nasopharyngeal(NP) swabs in vial transport medium  Result Value Ref Range Status   SARS Coronavirus 2 by RT PCR NEGATIVE NEGATIVE Final    Comment: (NOTE) SARS-CoV-2 target nucleic acids are NOT DETECTED.  The SARS-CoV-2 RNA is generally detectable in upper respiratory specimens during the acute phase of infection. The lowest concentration of SARS-CoV-2 viral copies this assay can detect is 138 copies/mL. A negative result does not preclude SARS-Cov-2 infection and should not be used as the sole basis for treatment or other patient management decisions. A  negative result may occur with  improper specimen collection/handling, submission of specimen other than nasopharyngeal swab, presence of viral mutation(s) within the areas targeted by this assay, and inadequate number of viral copies(<138 copies/mL). A negative result must be combined with clinical observations, patient history, and epidemiological information. The expected result is Negative.  Fact Sheet for Patients:  EntrepreneurPulse.com.au  Fact Sheet for Healthcare Providers:  IncredibleEmployment.be  This test is no t yet approved or cleared by the [place] FDA and  has been authorized for detection and/or diagnosis of SARS-CoV-2 by FDA under an Emergency Use Authorization (EUA). This EUA will remain  in effect (meaning this test can be used) for the duration of the COVID-19 declaration under Section 564(b)(1) of the Act, 21 U.S.C.section 360bbb-3(b)(1), unless the authorization is terminated  or revoked sooner.       Influenza A by PCR NEGATIVE NEGATIVE Final   Influenza B by PCR NEGATIVE NEGATIVE Final    Comment: (NOTE) The Xpert Xpress SARS-CoV-2/FLU/RSV plus assay is intended as an aid in the diagnosis of influenza from Nasopharyngeal swab specimens and should not be used as a sole basis for treatment. Nasal washings and aspirates are unacceptable for Xpert Xpress SARS-CoV-2/FLU/RSV testing.  Fact Sheet for Patients: EntrepreneurPulse.com.au  Fact Sheet for Healthcare Providers: IncredibleEmployment.be  This test is not yet approved or cleared by the [place] FDA and has been authorized for detection and/or diagnosis of SARS-CoV-2 by FDA under an Emergency Use Authorization (EUA). This EUA will remain in effect (meaning this test can be used) for the duration of the COVID-19 declaration under Section 564(b)(1) of the Act, 21 U.S.C. section 360bbb-3(b)(1), unless the authorization  is terminated or revoked.  Performed at [facility], [address] [place], [place] [address]   Blood culture (routine x 2)     Status: None (Preliminary result)   Collection Time: 08/09/21 11:21 PM   Specimen: BLOOD LEFT HAND  Result Value Ref Range Status   Specimen Description BLOOD LEFT HAND  Final   Special Requests   Final    PEDIATRICS Blood Culture results may not be optimal due to an excessive volume of blood received in culture bottles   Culture   Final    NO GROWTH < 12 HOURS Performed at [facility], [address], [place], [place] [address]    Report Status PENDING  Incomplete    IMAGING RESULTS:  I have personally reviewed the films ? Impression/Recommendation ?Acute hypoxic and hypercarbic resp falilure on chronic respiratory failure- this episode precipitated by anxiety Pt has underlying OSA but has never been assessed and given CPAP  Seizures- could be related to the acute  hypoxia. Wife does not think he had seizures  COVID respiratory illness in 2021 leading to need for oxygen Underlying atelectasis and ILD?/  Recent MSSA bacteremia and Rt septic knee S/p I/D and also received 4 weeks of IV cefazolin- He will need Po for 4 more weeks- While in the hospital continue IV cefzolin  Anemia  Hypokalemia   ___________________________________________________ Discussed with wife and care team Note:  This document was prepared using Dragon voice recognition software and may include unintentional dictation errors.

## 2021-08-10 NOTE — H&P (Addendum)
NAME:  Craig [name], MRN:  [id number], DOB:  1991-01-10, LOS: 0 ADMISSION DATE:  08/09/2021, CONSULTATION DATE:  08/10/21 REFERRING MD: [name], MD CHIEF COMPLAINT: SOB    HPI  30 y.o with significant PMH of of extensive medical history including inflammatory arthritis on methotrexate and chronic prednisone at 10 mg daily, prior severe Covid-19 infection requiring prolonged hospitalization and tracheostomy (since decannulated) Dec 2021 - Feb 2022, chronic hypoxic and hypercarpneic respiratory failure on 2L O2 PRN, MSSA bacteremia, MSSA pneumonia, perforated diverticulitis s/p partial colectomy with ostomy creation, morbid obesity and gout who presented to the ED with chief complaints of acute onset shortness of breath.  Per patient's wife at the bedside, patient became anxious and acutely short of breath while transferring form the chair to his new tempur pedic bed. EMS was called and on arrival patient was found to be hypoxic with sats in the mid 80's. Patient suffered what appeared to be a generalized tonic clonic seizures while being prepared for transport to the ED. He was placed on NRB mask and bagged during transport.  ED Course: On arrival to the ED, he was afebrile with blood pressure  mm Hg and pulse rate  beats/min. There were no focal neurological deficits; patient was somnolent, not responsive to deep sternal rub or responding to commands. EKG viewed and interpreted by myself shows sinus tachycardia 143 bpm with a narrow QRS, normal axis, slight QTC prolongation otherwise largely normal intervals with nonspecific ST changes no ST elevation. Patient was subsequently intubated with 7.5 cuffed ETT for airway protection. Chest x-ray  post intubation showed vascular congestion.  ET tube in satisfactory position.  PICC line in satisfactory position.  Pertinent Labs in Red/Diagnostics Findings: Na+/ K+: 136/4.3 Glucose: 259 BUN/Cr.: 6/0.63 AST/ALT:57/8   WBC/ TMAX: 22.6/  afebrile Hgb/Hct: 10.3/36.6 PCT: negative <0.10 Lactic acid: 3.7 COVID PCR: Negative   Troponin:23 Arterial Blood Gas result:  pO2 113; pCO2 63; pH 7.37;  HCO3 36.4, %O2 Sat 98.3.  Patient started on broad spectrum antibiotics. PCCM is asked to admit the patient to ICU for further work up and management of acute hypoxic hypercapnic respiratory failure.  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 No acute intracranial pathology. 2. Mild bilateral ethmoid sinus and marked severity sphenoid sinus disease. 12/22: CTA Chest>No evidence of pulmonary embolism. 2. Stable, moderate to marked severity bilateral upper lobe and left lower lobe consolidative atelectatic changes. 3. Hepatic steatosis  Micro Data:  12/21: SARS-CoV-2 PCR> negative 12/21: Influenza PCR> negative 12/21: Blood  culture x2> 12/21: Urine Culture> 12/22: MRSA PCR>>  12/22: Strep pneumo urinary antigen> 12/22: Legionella urinary  antigen>  Antimicrobials:  Vancomycin 12/21 x 1 Aztreonam 12/21 x 1  OBJECTIVE  Blood pressure 118/83, pulse (!) 114, temperature 98.7 F (37.1 C), resp. rate 19, height '6\' 1"'  (1.854 m), weight (!) 141.2 kg, SpO2 100 %.    Vent Mode: AC FiO2 (%):  [50 %-70 %] 50 % Set Rate:  [18 bmp] 18 bmp Vt Set:  [530 mL] 530 mL PEEP:  [5 cmH20] 5 cmH20  No intake or output data in the 24 hours ending 08/10/21 0002 Filed Weights   08/09/21 2236  Weight: (!) 141.2 kg   Physical Examination  GENERAL: 30 year-old critically ill patient lying in the bed intubated and sedated EYES: Pupils equal, round, reactive to light and accommodation. No scleral icterus. Extraocular muscles intact.  HEENT: Head atraumatic, normocephalic. Oropharynx and nasopharynx clear.  NECK:  Supple, no jugular venous distention. No thyroid enlargement, no tenderness.  LUNGS: Normal breath sounds bilaterally, no wheezing, rales,rhonchi or crepitation. No use of accessory muscles of respiration.  CARDIOVASCULAR: S1, S2 normal. No murmurs, rubs, or gallops.  ABDOMEN: Soft, Obese, nondistended. Bowel sounds present. No organomegaly or mass. Ostomy present EXTREMITIES: Bilateral lower extremities pitting edema/ Anasarca NEUROLOGIC: Cranial nerves II through XII are intact.  Muscle strength not assessed. Sensation intact. Gait not checked.  PSYCHIATRIC: The patient is intubated and sedated SKIN: see below    Labs/imaging that I havepersonally reviewed  (right click and "Reselect all SmartList Selections" daily)     Labs   CBC: Recent Labs  Lab 08/09/21 2246  WBC 22.6*  HGB 10.3*  HCT 36.6*  MCV 98.1  PLT 562    Basic Metabolic Panel: Recent Labs  Lab 08/09/21 2246  NA 136  K 4.3  CL 94*  CO2 36*  GLUCOSE 259*  BUN 6  CREATININE 0.63  CALCIUM 10.0   GFR: Estimated Creatinine Clearance: 195.8 mL/min (by C-G formula based on SCr of 0.63 mg/dL). Recent Labs  Lab 08/09/21 2246 08/09/21 2247  WBC 22.6*   --   LATICACIDVEN  --  3.7*    Liver Function Tests: Recent Labs  Lab 08/09/21 2246  AST 57*  ALT 8  ALKPHOS 70  BILITOT 0.9  PROT 7.8  ALBUMIN 3.1*   No results for input(s): LIPASE, AMYLASE in the last 168 hours. No results for input(s): AMMONIA in the last 168 hours.  ABG    Component Value Date/Time   PHART 7.37 08/09/2021 2344   PCO2ART 63 (H) 08/09/2021 2344   PO2ART 113 (H) 08/09/2021 2344   HCO3 36.4 (H) 08/09/2021 2344   TCO2 41 (H) 07/18/2021 0843   O2SAT 98.3 08/09/2021 2344     Coagulation Profile: Recent Labs  Lab 08/09/21 2313  INR 1.2    Cardiac Enzymes: No results for input(s): CKTOTAL, CKMB, CKMBINDEX, TROPONINI in the last 168 hours.  HbA1C: Hgb A1c MFr Bld  Date/Time Value Ref Range Status  07/08/2021 06:29 PM 6.8 (H) 4.8 - 5.6 % Final    Comment:    (NOTE) Pre diabetes:          5.7%-6.4%  Diabetes:              >6.4%  Glycemic control for   <7.0% adults with diabetes   08/10/2020 04:33 AM 5.7 (H) 4.8 - 5.6 % Final    Comment:    (NOTE) Pre diabetes:  5.7%-6.4%  Diabetes:              >6.4%  Glycemic control for   <7.0% adults with diabetes     CBG: Recent Labs  Lab 08/09/21 2359  GLUCAP 208*    Review of Systems:   UNABLE TO OBTAIN DUE TO PATIENT BEING ON THE VENT AND SEDATED  Past Medical History  He,  has a past medical history of Gout, Hypertension, and Rupture of bowel ([place]).   Surgical History    Past Surgical History:  Procedure Laterality Date   COLONOSCOPY WITH PROPOFOL N/A 05/18/2020   Procedure: COLONOSCOPY WITH PROPOFOL;  Surgeon: Benjamine Sprague, DO;  Location: [facility];  Service: General;  Laterality: N/A;   COLOSTOMY N/A 11/22/2019   Procedure: COLOSTOMY;  Surgeon: Herbert Pun, MD;  Location: [facility];  Service: General;  Laterality: N/A;   KNEE ARTHROSCOPY Right 07/11/2021   Procedure: SYNOVIAL BIOPSY AND ARTHROSCOPIC IRRGATION AND DEBRIDEMENT OF SEPTIC KNEE;  Surgeon: Hessie Knows, MD;  Location: [facility];  Service: Orthopedics;  Laterality: Right;   KNEE ARTHROSCOPY Right 07/18/2021   Procedure: ARTHROSCOPY KNEE, IRRIGATION AND DEBRIDEMENT;  Surgeon: Hessie Knows, MD;  Location: [facility];  Service: Orthopedics;  Laterality: Right;   LAPAROTOMY N/A 11/22/2019   Procedure: EXPLORATORY LAPAROTOMY;  Surgeon: Herbert Pun, MD;  Location: [facility];  Service: General;  Laterality: N/A;   LAPAROTOMY N/A 07/08/2020   Procedure: EXPLORATORY LAPAROTOMY;  Surgeon: Herbert Pun, MD;  Location: [facility];  Service: General;  Laterality: N/A;   PARTIAL COLECTOMY N/A 11/22/2019   Procedure: PARTIAL COLECTOMY;  Surgeon: Herbert Pun, MD;  Location: [facility];  Service: General;  Laterality: N/A;   PEG PLACEMENT N/A 09/14/2020   Procedure: PERCUTANEOUS ENDOSCOPIC GASTROSTOMY (PEG) PLACEMENT;  Surgeon: Benjamine Sprague, DO;  Location: [facility];  Service: General;  Laterality: N/A;  TRAVEL CASE [place] Right 07/11/2021   Procedure: SYNOVIAL BIOPSY;  Surgeon: Hessie Knows, MD;  Location: [facility];  Service: Orthopedics;  Laterality: Right;   TRACHEOSTOMY TUBE PLACEMENT N/A 09/13/2020   Procedure: TRACHEOSTOMY;  Surgeon: Beverly Gust, MD;  Location: [facility];  Service: ENT;  Laterality: N/A;   XI ROBOTIC ASSISTED COLOSTOMY TAKEDOWN N/A 06/28/2020   Procedure: XI ROBOTIC ASSISTED COLOSTOMY TAKEDOWN CONVERTED TO OPEN PROCEDURE;  Surgeon: Herbert Pun, MD;  Location: [facility];  Service: General;  Laterality: N/A;     Social History   reports that he has never smoked. His smokeless tobacco use includes chew. He reports current alcohol use. He reports that he does not use drugs.   Family History   His family history includes Healthy in his father and mother.   Allergies Allergies  Allergen Reactions   Amoxicillin Rash    Tolerated cefepime and cefazolin 08/2020.     Home Medications  Prior to Admission  medications   Medication Sig Start Date End Date Taking? Authorizing Provider  acetaminophen (TYLENOL) 650 MG CR tablet Take 650 mg by mouth every 8 (eight) hours as needed. 10/14/20  Yes [provider]  acetaZOLAMIDE (DIAMOX) 250 MG tablet Take 1 tablet (250 mg total) by mouth 2 (two) times daily. 07/25/21  Yes Swayze, Ava, DO  albuterol (ACCUNEB) 1.25 MG/3ML nebulizer solution Take 1 ampule by nebulization 3 (three) times daily as needed. 03/07/21  Yes [provider]  allopurinol (ZYLOPRIM) 300 MG tablet Take 300 mg by mouth daily. 02/09/21  Yes [provider]  ascorbic acid (VITAMIN C) 500 MG tablet Take  1 tablet (500 mg total) by mouth 2 (two) times daily. 07/25/21  Yes Swayze, Ava, DO  budesonide (PULMICORT) 0.25 MG/2ML nebulizer solution Take 0.25 mg by nebulization daily. 03/07/21 03/07/22 Yes [provider]  cefadroxil (DURICEF) 1 g tablet Take 1 tablet (1 g total) by mouth 2 (two) times daily. 08/08/21  Yes Tsosie Billing, MD  cetirizine (ZYRTEC) 10 MG tablet Take 10 mg by mouth daily.   Yes [provider]  Cholecalciferol (VITAMIN D3) 25 MCG (1000 UT) CAPS Take 1 capsule by mouth daily.   Yes [provider]  [facility] SALINE NASAL SPRAY 0.65 % nasal spray SMARTSIG:1 Spray(s) Both Nares Every 4 Hours PRN 10/14/20  Yes [provider]  DULoxetine (CYMBALTA) 60 MG capsule Take 60 mg by mouth daily. 11/14/20 11/14/21 Yes [provider]  famotidine (PEPCID) 20 MG tablet Take 20 mg by mouth 2 (two) times daily. 10/14/20  Yes [provider]  folic acid (FOLVITE) 1 MG tablet Take 1 mg by mouth daily. 03/01/21 03/01/22 Yes [provider]  furosemide (LASIX) 40 MG tablet Take 1 tablet (40 mg total) by mouth 2 (two) times daily. Patient taking differently: Take 40 mg by mouth daily. 07/25/21  Yes Swayze, Ava, DO  lidocaine (LIDODERM) 5 % 1 patch daily as needed. 07/07/21  Yes [provider]  melatonin 3  MG TABS tablet Take 6 mg by mouth at bedtime as needed. 10/14/20  Yes [provider]  metoprolol succinate (TOPROL-XL) 100 MG 24 hr tablet Take 100 mg by mouth daily. 01/05/21 01/05/22 Yes [provider]  Multiple Vitamins-Minerals (MULTIVITAMIN WITH MINERALS) tablet Take 1 tablet by mouth daily.   Yes [provider]  nicotine (NICODERM CQ - DOSED IN MG/24 HOURS) 21 mg/24hr patch Place 1 patch (21 mg total) onto the skin daily. 07/26/21  Yes Swayze, Ava, DO  nystatin (MYCOSTATIN/NYSTOP) powder Apply topically 3 (three) times daily. 07/25/21  Yes Swayze, Ava, DO  Omega-3 Fatty Acids (FISH OIL) 1000 MG CAPS Take 1 capsule by mouth daily.   Yes [provider]  ondansetron (ZOFRAN) 4 MG tablet Take 4 mg by mouth every 8 (eight) hours as needed. 10/14/20  Yes [provider]  oxyCODONE 10 MG TABS Take 0.5-1 tablets (5-10 mg total) by mouth every 6 (six) hours as needed for moderate pain or severe pain. 07/25/21  Yes Swayze, Ava, DO  polyethylene glycol (MIRALAX / GLYCOLAX) 17 g packet Take 17 g by mouth daily. 07/25/21  Yes Swayze, Ava, DO  potassium chloride 20 MEQ TBCR Take 10 mEq by mouth 2 (two) times daily. 07/25/21  Yes Swayze, Ava, DO  pregabalin (LYRICA) 200 MG capsule Take 200 mg by mouth 2 (two) times daily. 03/01/21 03/01/22 Yes [provider]  rivaroxaban (XARELTO) 20 MG TABS tablet Take 20 mg by mouth daily with supper. 11/07/20  Yes [provider]  traMADol (ULTRAM) 50 MG tablet Take 50 mg by mouth 2 (two) times daily as needed. 08/08/21  Yes [provider]  blood glucose meter kit and supplies KIT Dispense based on patient and insurance preference. Use up to four times daily as directed. 07/26/21   Richarda Osmond, MD  clonazePAM (KLONOPIN) 1 MG tablet Take by mouth. Patient not taking: Reported on 08/09/2021 10/14/20   [provider]  insulin aspart (NOVOLOG) 100 UNIT/ML injection Inject 10 Units into the skin 3  (three) times daily with meals. Patient not taking: Reported on 08/08/2021 07/25/21   Swayze, Ava, DO  insulin glargine (LANTUS) 100 UNIT/ML Solostar Pen Inject 40 Units into the skin daily. Patient not taking: Reported on 08/08/2021 07/27/21   Richarda Osmond, MD  Scheduled Meds: Scheduled Meds:  docusate  100 mg Per Tube BID   heparin  5,000 Units Subcutaneous Q8H   insulin aspart  0-20 Units Subcutaneous Q4H   pantoprazole (PROTONIX) IV  40 mg Intravenous QHS   polyethylene glycol  17 g Per Tube Daily   Continuous Infusions:  fentaNYL infusion INTRAVENOUS 50 mcg/hr (08/10/21 0318)   lactated ringers 150 mL/hr at 08/10/21 0316   levETIRAcetam     levETIRAcetam     propofol (DIPRIVAN) infusion 80 mcg/kg/min (08/10/21 0306)   vancomycin 2,000 mg (08/10/21 0123)   Followed by   vancomycin     PRN Meds:.fentaNYL, midazolam   Assessment & Plan:  Acute on chronic hypoxic and hypercarbic respiratory failure probable Pneumonia vs Anxiety State? PMHx: Covid-19 pneumonia, MSSA pneumonia, Morbid obesity, probable OHS/OSA,  -continue ventilator support & lung protective strategies -Wean PEEP & FiO2 as tolerated, maintain SpO2 > 90% -Head of bed elevated 30 degrees, VAP protocol in place -Plateau pressures less than 30 cm H20  -Intermittent chest x-ray & ABG PRN -Daily WUA with SBT per protocol -Ensure adequate pulmonary hygiene  -Bronchodilators PRN -PAD protocol in place; wean sedation/analgesia for RASS goal 0   Sepsis 2/2 MSSA bacteremia with MSSA right knee septic arthritis s/p I&D x2 Lactic acidosis Patient seen by ID on 12/20. Was to complete IV Cefazolin on 12/22 then plan to transition to Cefadroxil -Received Vanc and Aztreonam x 1 in the ED. Pending ID recs for further abx guidance -TEE on 11/23 negative -F/u blood cultures until clear; follow up trach aspirate, strep pneumo and Legionella antigen -Monitor WBC/ fever curve -Strict I/O's -Pressors for MAP goal  >65 -Continue steroids per home; no indication for stress dose steroids at this time -Gentle fluids given gross anasarca   Acute metabolic encephalopathy likely multifactorial in the setting of sepsis and possible seizure like activity -CT Head negative for acute abnormality -Goal RASS 0 -Supportive care   Seizure Like Activity Similar presentation during last admission, Tramadol was previously discontinued due to suspected seizures, it appears that this was restarted. -CT Head shows no acute intracranial abnormality -Obtain EEG -Loaded with Keppra 1500 mg x 1 -Start Keppra 500 mg BID pending Neurology recs -Sedation as above weaning as able -Seizure precautions -Neurology consult   H/o upper extremity DVT It appears that he was started on Xarelto before around Feb/Mar 2022 for provoked upper extremity DVT.   -CTA chest neg for PE -Start SQH for VTE ppx -Resume Xarelto once able to take po  Anasarca Slightly worse, per significant other, patient has not been taking his Lasix or Diamox due to poor po intake. -Continue Diuretics as BP permits -Restart home med: Lasix and Acetazolamide   Diabetes mellitus Last HgbA1c 6.8 on 07/08/21 -CBGs -Sliding scale insulin -Follow ICU hyper/hypoglycemia protocol  target goal 140 - 180 mg/dL -Hold home Meds    Best practice:  Diet:  NPO Pain/Anxiety/Delirium protocol (if indicated): Yes (RASS goal 0) VAP protocol (if indicated): Yes DVT prophylaxis: Subcutaneous Heparin GI prophylaxis: PPI Glucose control:  SSI Yes Central venous access:  Yes, and it is still needed Arterial line:  N/A Foley:  Yes, and it is still needed Mobility:  bed rest  PT consulted: N/A Last date of multidisciplinary goals of care discussion [12/22] Code Status:  full code Disposition: ICU   =  Goals of Care = Code Status Order: FULL   Primary Emergency Contact: [place], Home Phone: [phone] Wishes to pursue full aggressive treatment and  intervention options, including CPR and intubation, but goals of care will be addressed on going with family if that should become necessary.  Critical care time: 45 minutes     Rufina Falco, DNP, CCRN, FNP-C, AGACNP-BC Acute Care Nurse Practitioner  [facility] Pager: [phone] [place] at [facility]  .

## 2021-08-10 NOTE — TOC Initial Note (Signed)
Transition of Care [facility]) - Initial/Assessment Note    Patient Details  Name: Craig [name] MRN: [id number] Date of Birth: 05-27-1991  [facility]) CM/SW Contact:    Alberteen Sam, LCSW Phone Number: 08/10/2021, 12:17 PM  Clinical Narrative:                  CSW notes patient recently discharged on 12/6. Per hx patient is from home with his wife Craig [name]. He has a hospital bed, grab bars on wall, w/c, lift chair at home.   Last admission patient was set up with [facility] for PT, OT and RN services, receives home O2 through [employer] and has a hx of IV antibiotics through [facility].   TOC will follow for needs depending on patient's medical course.    Expected Discharge Plan:  (TBD) Barriers to Discharge: Continued Medical Work up   Patient Goals and CMS Choice     Choice offered to / list presented to : Spouse  Expected Discharge Plan and Services Expected Discharge Plan:  (TBD)                                              Prior Living Arrangements/Services                       Activities of Daily Living      Permission Sought/Granted                  Emotional Assessment         Alcohol / Substance Use: Not Applicable Psych Involvement: No (comment)  Admission diagnosis:  Acute respiratory failure with hypoxia ([place]) [J96.01] Acute on chronic respiratory failure with hypoxia and hypercapnia (HCC) [J96.21, J96.22] Sepsis, due to unspecified organism, unspecified whether acute organ dysfunction present [facility]) [A41.9] Patient Active Problem List   Diagnosis Date Noted   Acute on chronic respiratory failure with hypoxia and hypercapnia ([place]) 08/10/2021   Acute on chronic respiratory failure with hypoxemia ([place]) 07/08/2021   Neuropathic pain, arm 10/20/2020   Pressure injury of skin 09/18/2020   Staphylococcus aureus bacteremia    Fever    Metabolic encephalopathy    Acute respiratory failure with hypoxia  ([place])    Acute hypoxemic respiratory failure due to COVID-19 ([place]) 08/09/2020   Severe sepsis ([place]) 08/09/2020   AKI (acute kidney injury) ([place]) 08/09/2020   Hypertension    Leak of anastomosis between gastrointestinal structures 07/08/2020   Pneumoperitoneum 07/08/2020   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 [place] [address] Phone: [phone] Fax: [phone]  [id number] [place], [address] [address] Phone: [phone] Fax: [phone]     Social Determinants of Health ([place]) Interventions    Readmission Risk Interventions Readmission Risk Prevention Plan 07/10/2021  Transportation Screening Complete  PCP or Specialist Appt within 3-5 Days Complete  HRI or [place] Complete  Social Work Consult for [place] Planning/Counseling Complete  Palliative Care Screening Not Applicable  Medication  Review Press photographer) Complete  Some recent data might be hidden

## 2021-08-11 ENCOUNTER — Encounter: Payer: Self-pay | Admitting: Internal Medicine

## 2021-08-11 ENCOUNTER — Inpatient Hospital Stay: Payer: Medicaid Other

## 2021-08-11 DIAGNOSIS — J9601 Acute respiratory failure with hypoxia: Secondary | ICD-10-CM

## 2021-08-11 DIAGNOSIS — Z7189 Other specified counseling: Secondary | ICD-10-CM

## 2021-08-11 DIAGNOSIS — A419 Sepsis, unspecified organism: Secondary | ICD-10-CM

## 2021-08-11 DIAGNOSIS — Z515 Encounter for palliative care: Secondary | ICD-10-CM

## 2021-08-11 LAB — BASIC METABOLIC PANEL
Anion gap: 5 (ref 5–15)
BUN: 6 mg/dL (ref 6–20)
CO2: 35 mmol/L — ABNORMAL HIGH (ref 22–32)
Calcium: 9.4 mg/dL (ref 8.9–10.3)
Chloride: 102 mmol/L (ref 98–111)
Creatinine, Ser: 0.39 mg/dL — ABNORMAL LOW (ref 0.61–1.24)
GFR, Estimated: >60 mL/min (ref >60)
Glucose, Bld: 126 mg/dL — ABNORMAL HIGH (ref 70–99)
Potassium: 3.6 mmol/L (ref 3.5–5.1)
Sodium: 142 mmol/L (ref 135–145)

## 2021-08-11 LAB — CBC
HCT: 28.4 % — ABNORMAL LOW (ref 39.0–52.0)
Hemoglobin: 8.4 g/dL — ABNORMAL LOW (ref 13.0–17.0)
MCH: 27.6 pg (ref 26.0–34.0)
MCHC: 29.6 g/dL — ABNORMAL LOW (ref 30.0–36.0)
MCV: 93.4 fL (ref 80.0–100.0)
Platelets: 179 10*3/uL (ref 150–400)
RBC: 3.04 MIL/uL — ABNORMAL LOW (ref 4.22–5.81)
RDW: 18.5 % — ABNORMAL HIGH (ref 11.5–15.5)
WBC: 4.1 10*3/uL (ref 4.0–10.5)
nRBC: 0 % (ref 0.0–0.2)

## 2021-08-11 LAB — TRIGLYCERIDES: Triglycerides: 149 mg/dL (ref <150)

## 2021-08-11 LAB — GLUCOSE, CAPILLARY
Glucose-Capillary: 142 mg/dL — ABNORMAL HIGH (ref 70–99)
Glucose-Capillary: 128 mg/dL — ABNORMAL HIGH (ref 70–99)
Glucose-Capillary: 180 mg/dL — ABNORMAL HIGH (ref 70–99)
Glucose-Capillary: 132 mg/dL — ABNORMAL HIGH (ref 70–99)
Glucose-Capillary: 165 mg/dL — ABNORMAL HIGH (ref 70–99)
Glucose-Capillary: 224 mg/dL — ABNORMAL HIGH (ref 70–99)

## 2021-08-11 LAB — URINE CULTURE: Culture: NO GROWTH

## 2021-08-11 LAB — MAGNESIUM: Magnesium: 1.8 mg/dL (ref 1.7–2.4)

## 2021-08-11 LAB — BRAIN NATRIURETIC PEPTIDE: B Natriuretic Peptide: 55.1 pg/mL (ref 0.0–100.0)

## 2021-08-11 LAB — PHOSPHORUS: Phosphorus: 3.6 mg/dL (ref 2.5–4.6)

## 2021-08-11 IMAGING — MR MR HEAD W/O CM
5 series · 48 of 48 positions shown · non-contrast
Comparison: CT head [DATE]

CLINICAL DATA: Meningitis/CNS infection suspected. Rule out septic
emboli.

EXAM:
MRI HEAD WITHOUT CONTRAST
TECHNIQUE: Multiplanar, multiecho pulse sequences of the brain and surrounding
structures were obtained without intravenous contrast.

[Series 5: ax dwi_tracew · axial · 3.0mm · 1.80mm/px · z∈[-41,+112]mm · 15 of 96 slices shown]
[im 1/96]
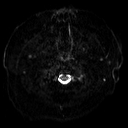
[im 7/96]
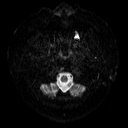
[im 14/96]
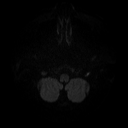
[im 21/96]
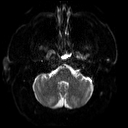
[im 28/96]
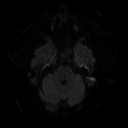
[im 34/96]
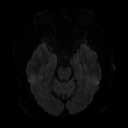
[im 41/96]
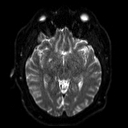
[im 48/96]
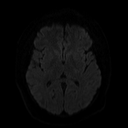
[im 55/96]
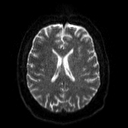
[im 62/96]
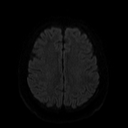
[im 68/96]
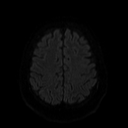
[im 75/96]
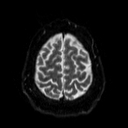
[im 82/96]
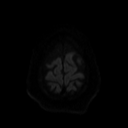
[im 89/96]
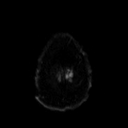
[im 96/96]
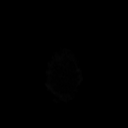

[Series 6: ax dwi_adc · axial · 3.0mm · 1.80mm/px · z∈[-41,+112]mm · 8 of 48 slices shown]
[im 1/48]
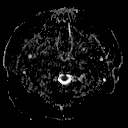
[im 7/48]
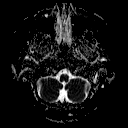
[im 14/48]
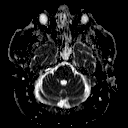
[im 21/48]
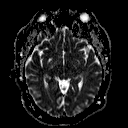
[im 27/48]
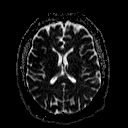
[im 34/48]
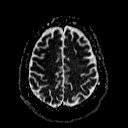
[im 41/48]
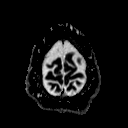
[im 48/48]
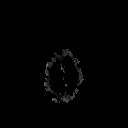

[Series 7: cor dwi_tracew · coronal · 5.0mm · 1.80mm/px · 14 of 84 slices shown]
[im 1/84]
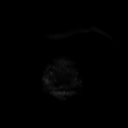
[im 7/84]
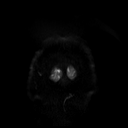
[im 13/84]
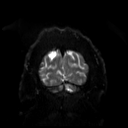
[im 20/84]
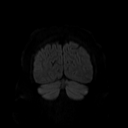
[im 26/84]
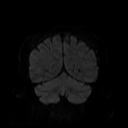
[im 32/84]
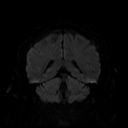
[im 39/84]
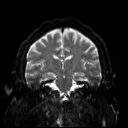
[im 45/84]
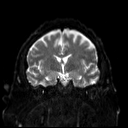
[im 52/84]
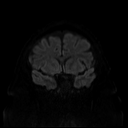
[im 58/84]
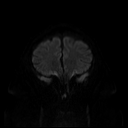
[im 64/84]
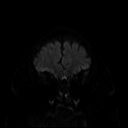
[im 71/84]
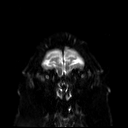
[im 77/84]
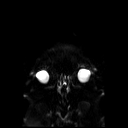
[im 84/84]
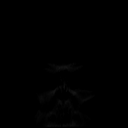

[Series 8: cor dwi_adc · coronal · 5.0mm · 1.80mm/px · 7 of 42 slices shown]
[im 1/42]
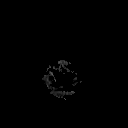
[im 7/42]
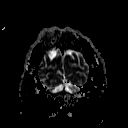
[im 14/42]
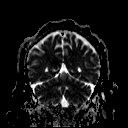
[im 21/42]
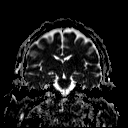
[im 28/42]
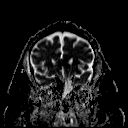
[im 35/42]
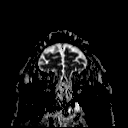
[im 42/42]
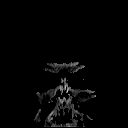

[Series 9: T1 · sagittal · 5.0mm · 0.62mm/px · 4 of 23 slices shown]
[im 1/23]
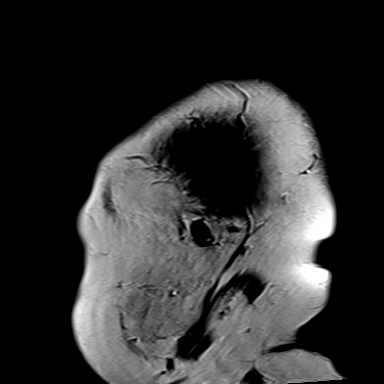
[im 8/23]
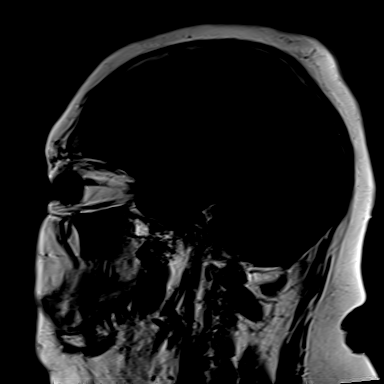
[im 15/23]
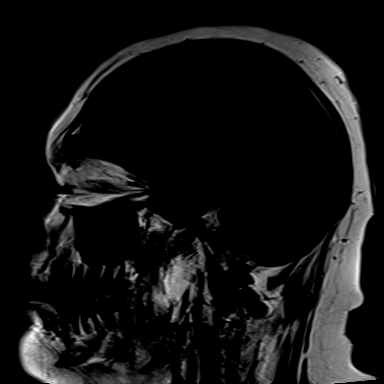
[im 23/23]
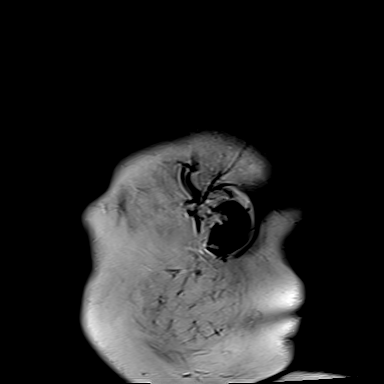

[48 of 48 positions shown; findings below may reference images not displayed]

FINDINGS: Brain: Incomplete study. The patient became hypotensive early in the
study and the study was terminated by the nurse supervising.

Diffusion-weighted imaging is diagnostic. No acute infarct
identified. Ventricle size normal. No fluid collection or areas of
restricted diffusion.

Mucosal edema of the sphenoid sinus.

No focal skeletal lesion.
IMPRESSION: Incomplete study due to episode of hypotension during the scan

Diffusion-weighted imaging demonstrates no acute infarct.

## 2021-08-11 MED ORDER — DOCUSATE SODIUM 50 MG/5ML PO LIQD
100.0000 mg | Freq: Two times a day (BID) | ORAL | Status: DC
  Administered 2021-08-12 – 2021-08-17 (×7): 100 mg
  Filled 2021-08-11 (×12): qty 10

## 2021-08-11 MED ORDER — GADOBUTROL 1 MMOL/ML IV SOLN: 10.0000 mL | Freq: Once | INTRAVENOUS | Status: DC | PRN

## 2021-08-11 MED ORDER — RIVAROXABAN 20 MG PO TABS
20.0000 mg | ORAL_TABLET | Freq: Every day | ORAL | Status: DC
  Administered 2021-08-11 – 2021-08-18 (×8): 20 mg
  Filled 2021-08-11 (×9): qty 1

## 2021-08-11 MED ORDER — MAGNESIUM SULFATE 2 GM/50ML IV SOLN
2.0000 g | Freq: Once | INTRAVENOUS | Status: AC
  Administered 2021-08-11: 09:00:00 2 g via INTRAVENOUS
  Filled 2021-08-11: qty 50

## 2021-08-11 MED ORDER — LORAZEPAM 2 MG/ML IJ SOLN
2.0000 mg | Freq: Once | INTRAMUSCULAR | Status: AC
  Administered 2021-08-11: 08:00:00 2 mg via INTRAVENOUS

## 2021-08-11 MED ORDER — MIDAZOLAM-SODIUM CHLORIDE 100-0.9 MG/100ML-% IV SOLN
0.5000 mg/h | INTRAVENOUS | Status: DC
  Administered 2021-08-11: 04:00:00 0.5 mg/h via INTRAVENOUS
  Filled 2021-08-11: qty 100

## 2021-08-11 MED ORDER — FUROSEMIDE 10 MG/ML IJ SOLN
40.0000 mg | Freq: Two times a day (BID) | INTRAMUSCULAR | Status: DC
  Administered 2021-08-11 – 2021-08-14 (×8): 40 mg via INTRAVENOUS
  Filled 2021-08-11 (×8): qty 4

## 2021-08-11 MED ORDER — VALPROATE SODIUM 100 MG/ML IV SOLN
500.0000 mg | Freq: Two times a day (BID) | INTRAVENOUS | Status: DC
  Administered 2021-08-11 – 2021-08-13 (×5): 500 mg via INTRAVENOUS
  Filled 2021-08-11 (×7): qty 5

## 2021-08-11 MED ORDER — DEXMEDETOMIDINE HCL IN NACL 400 MCG/100ML IV SOLN
0.4000 ug/kg/h | INTRAVENOUS | Status: DC
  Administered 2021-08-11 (×4): 1.2 ug/kg/h via INTRAVENOUS
  Administered 2021-08-11: 08:00:00 1 ug/kg/h via INTRAVENOUS
  Administered 2021-08-11 – 2021-08-12 (×2): 1.2 ug/kg/h via INTRAVENOUS
  Administered 2021-08-12: 15:00:00 1.1 ug/kg/h via INTRAVENOUS
  Administered 2021-08-12 (×3): 1.2 ug/kg/h via INTRAVENOUS
  Administered 2021-08-12: 20:00:00 1.1 ug/kg/h via INTRAVENOUS
  Administered 2021-08-12 (×2): 1.2 ug/kg/h via INTRAVENOUS
  Administered 2021-08-12 (×2): 1.1 ug/kg/h via INTRAVENOUS
  Administered 2021-08-13: 14:00:00 1.2 ug/kg/h via INTRAVENOUS
  Administered 2021-08-13: 01:00:00 1.1 ug/kg/h via INTRAVENOUS
  Administered 2021-08-13 (×2): 1.2 ug/kg/h via INTRAVENOUS
  Administered 2021-08-13: 08:00:00 0.8 ug/kg/h via INTRAVENOUS
  Administered 2021-08-13: 11:00:00 1 ug/kg/h via INTRAVENOUS
  Administered 2021-08-13: 04:00:00 0.9 ug/kg/h via INTRAVENOUS
  Administered 2021-08-14 (×2): 0.8 ug/kg/h via INTRAVENOUS
  Filled 2021-08-11 (×27): qty 100

## 2021-08-11 MED ORDER — LORAZEPAM 2 MG/ML IJ SOLN
INTRAMUSCULAR | Status: AC
  Filled 2021-08-11: qty 1

## 2021-08-11 MED ORDER — VECURONIUM BROMIDE 10 MG IV SOLR
10.0000 mg | INTRAVENOUS | Status: DC | PRN
  Filled 2021-08-11: qty 10

## 2021-08-11 MED ORDER — POLYETHYLENE GLYCOL 3350 17 G PO PACK: 17.0000 g | PACK | Freq: Every day | ORAL | Status: DC | PRN

## 2021-08-11 MED ORDER — POTASSIUM CHLORIDE 10 MEQ/100ML IV SOLN
10.0000 meq | INTRAVENOUS | Status: AC
  Administered 2021-08-11 (×3): 10 meq via INTRAVENOUS
  Filled 2021-08-11 (×3): qty 100

## 2021-08-11 MED ORDER — PROPOFOL 1000 MG/100ML IV EMUL
5.0000 ug/kg/min | INTRAVENOUS | Status: DC
  Administered 2021-08-11: 13:00:00 30 ug/kg/min via INTRAVENOUS
  Filled 2021-08-11 (×2): qty 100

## 2021-08-11 MED ORDER — FENTANYL CITRATE PF 50 MCG/ML IJ SOSY
50.0000 ug | PREFILLED_SYRINGE | INTRAMUSCULAR | Status: DC | PRN
  Administered 2021-08-12: 21:00:00 200 ug via INTRAVENOUS
  Administered 2021-08-12: 05:00:00 100 ug via INTRAVENOUS
  Administered 2021-08-12: 04:00:00 50 ug via INTRAVENOUS
  Administered 2021-08-12: 200 ug via INTRAVENOUS
  Administered 2021-08-12 (×3): 100 ug via INTRAVENOUS
  Administered 2021-08-12: 23:00:00 200 ug via INTRAVENOUS

## 2021-08-11 MED ORDER — CLONAZEPAM 1 MG PO TABS
1.0000 mg | ORAL_TABLET | Freq: Two times a day (BID) | ORAL | Status: DC
  Administered 2021-08-11 – 2021-08-18 (×15): 1 mg
  Filled 2021-08-11 (×16): qty 1

## 2021-08-11 MED ORDER — ARFORMOTEROL TARTRATE 15 MCG/2ML IN NEBU
15.0000 ug | INHALATION_SOLUTION | Freq: Two times a day (BID) | RESPIRATORY_TRACT | Status: DC
  Administered 2021-08-11 – 2021-08-23 (×23): 15 ug via RESPIRATORY_TRACT
  Filled 2021-08-11 (×27): qty 2

## 2021-08-11 MED ORDER — DOCUSATE SODIUM 50 MG/5ML PO LIQD: 100.0000 mg | Freq: Two times a day (BID) | ORAL | Status: DC | PRN

## 2021-08-11 MED ORDER — POLYETHYLENE GLYCOL 3350 17 G PO PACK
17.0000 g | PACK | Freq: Every day | ORAL | Status: DC
  Administered 2021-08-12 – 2021-08-13 (×2): 17 g
  Filled 2021-08-11 (×4): qty 1

## 2021-08-11 MED ORDER — LORAZEPAM 2 MG/ML IJ SOLN
1.0000 mg | INTRAMUSCULAR | Status: DC | PRN
Start: 2021-08-11 — End: 2021-08-15
  Administered 2021-08-11 – 2021-08-14 (×7): 2 mg via INTRAVENOUS
  Administered 2021-08-14: 08:00:00 1 mg via INTRAVENOUS
  Administered 2021-08-15: 01:00:00 2 mg via INTRAVENOUS
  Filled 2021-08-11 (×9): qty 1

## 2021-08-11 MED ORDER — FENTANYL CITRATE PF 50 MCG/ML IJ SOSY: 50.0000 ug | PREFILLED_SYRINGE | INTRAMUSCULAR | Status: DC | PRN

## 2021-08-11 NOTE — Progress Notes (Signed)
Date of Admission:  08/09/2021     ID: Craig [name] is a 30 y.o. male  Principal Problem:   Acute on chronic respiratory failure with hypoxia and hypercapnia (HCC)    Subjective: Remains intubated  Medications:   arformoterol  15 mcg Nebulization BID   budesonide (PULMICORT) nebulizer solution  0.25 mg Nebulization BID   chlorhexidine gluconate (MEDLINE KIT)  15 mL Mouth Rinse BID   Chlorhexidine Gluconate Cloth  6 each Topical Daily   clonazePAM  1 mg Per Tube BID   docusate  100 mg Per Tube BID   furosemide  40 mg Intravenous BID   insulin aspart  0-20 Units Subcutaneous Q4H   mouth rinse  15 mL Mouth Rinse 10 times per day   methylPREDNISolone (SOLU-MEDROL) injection  20 mg Intravenous Q12H   pantoprazole (PROTONIX) IV  40 mg Intravenous QHS   polyethylene glycol  17 g Per Tube Daily   rivaroxaban  20 mg Per Tube Daily    Objective: Vital signs in last 24 hours: Temp:  [97.3 F (36.3 C)-100 F (37.8 C)] 100 F (37.8 C) (12/23 2000) Pulse Rate:  [48-139] 57 (12/23 2300) Resp:  [15-25] 18 (12/23 2300) BP: (95-169)/(49-98) 130/98 (12/23 2300) SpO2:  [95 %-100 %] 100 % (12/23 2300) FiO2 (%):  [30 %-50 %] 40 % (12/23 2052) Weight:  [145.7 kg] 145.7 kg (12/23 0345)  PHYSICAL EXAM:  General: awake, intubated Lungs: b/l air entry- rhonchi few Heart: tachycardia Abdomen: Soft, colostomy Extremities: edematous Skin: Nsome bruising Rt picc Foley  Neurologic: Grossly non-focal  Lab Results Recent Labs    08/10/21 0339 08/10/21 1434 08/11/21 0609  WBC 9.9  --  4.1  HGB 8.5*  --  8.4*  HCT 29.2*  --  28.4*  NA 137 141 142  K 2.5* 3.9 3.6  CL 96* 100 102  CO2 33* 33* 35*  BUN 6 <5* 6  CREATININE 0.37* 0.37* 0.39*   Liver Panel Recent Labs    08/09/21 2246  PROT 7.8  ALBUMIN 3.1*  AST 57*  ALT 8  ALKPHOS 70  BILITOT 0.9   Sedimentation Rate No results for input(s): ESRSEDRATE in the last 72 hours. C-Reactive Protein No results for  input(s): CRP in the last 72 hours.  Microbiology:  Studies/Results: DG Abd 1 View  Result Date: 08/10/2021 CLINICAL DATA:  OG tube placement EXAM: ABDOMEN - 1 VIEW COMPARISON:  07/08/2021 FINDINGS: OG tube tip is in the distal stomach. Paucity of gas throughout the abdomen. IMPRESSION: OG tube in the distal stomach Electronically Signed   By: Craig [name] M.D.   On: 08/10/2021 07:00   CT HEAD WO CONTRAST (5MM)  Result Date: 08/10/2021 CLINICAL DATA:  Status post seizure. EXAM: CT HEAD WITHOUT CONTRAST TECHNIQUE: Contiguous axial images were obtained from the base of the skull through the vertex without intravenous contrast. COMPARISON:  July 08, 2021 FINDINGS: Brain: No evidence of acute infarction, hemorrhage, hydrocephalus, extra-axial collection or mass lesion/mass effect. Vascular: No hyperdense vessel or unexpected calcification. Skull: Normal. Negative for fracture or focal lesion. Sinuses/Orbits: There is mild bilateral ethmoid sinus and marked severity sphenoid sinus mucosal thickening Other: None. IMPRESSION: 1. No acute intracranial pathology. 2. Mild bilateral ethmoid sinus and marked severity sphenoid sinus disease. Electronically Signed   By: Craig [name] M.D.   On: 08/10/2021 01:26   CT Angio Chest PE W and/or Wo Contrast  Result Date: 08/10/2021 CLINICAL DATA:  Shortness of breath. EXAM: CT ANGIOGRAPHY CHEST WITH  CONTRAST TECHNIQUE: Multidetector CT imaging of the chest was performed using the standard protocol during bolus administration of intravenous contrast. Multiplanar CT image reconstructions and MIPs were obtained to evaluate the vascular anatomy. CONTRAST:  157m OMNIPAQUE IOHEXOL 350 MG/ML SOLN COMPARISON:  July 08, 2021 FINDINGS: Cardiovascular: The subsegmental pulmonary arteries are limited in evaluation secondary to overlying beam hardening artifact. No evidence of pulmonary embolism. Normal heart size. No pericardial effusion. Mediastinum/Nodes: No  enlarged mediastinal, hilar, or axillary lymph nodes. Thyroid gland, trachea, and esophagus demonstrate no significant findings. Lungs/Pleura: Endotracheal tube is seen. Stable, moderate to marked severity consolidative atelectatic changes are seen within the bilateral upper lobes and left lower lobe. There is no evidence of a pleural effusion or pneumothorax. Upper Abdomen: There is stable elevation of the right hemidiaphragm. Diffuse fatty infiltration of the liver parenchyma is noted. Musculoskeletal: No chest wall abnormality. No acute or significant osseous findings. Review of the MIP images confirms the above findings. IMPRESSION: 1. No evidence of pulmonary embolism. 2. Stable, moderate to marked severity bilateral upper lobe and left lower lobe consolidative atelectatic changes. 3. Hepatic steatosis. Electronically Signed   By: Craig [name].   On: 08/10/2021 01:29   MR BRAIN WO CONTRAST  Result Date: 08/11/2021 CLINICAL DATA:  Meningitis/CNS infection suspected. Rule out septic emboli. EXAM: MRI HEAD WITHOUT CONTRAST TECHNIQUE: Multiplanar, multiecho pulse sequences of the brain and surrounding structures were obtained without intravenous contrast. COMPARISON:  CT head 08/10/2021 FINDINGS: Brain: Incomplete study. The patient became hypotensive early in the study and the study was terminated by the nurse supervising. Diffusion-weighted imaging is diagnostic. No acute infarct identified. Ventricle size normal. No fluid collection or areas of restricted diffusion. Mucosal edema of the sphenoid sinus. No focal skeletal lesion. IMPRESSION: Incomplete study due to episode of hypotension during the scan Diffusion-weighted imaging demonstrates no acute infarct. Electronically Signed   By: Craig GalloM.D.   On: 08/11/2021 14:45   EEG adult  Result Date: 08/11/2021 SDerek Jack MD     08/11/2021 10:44 AM Routine EEG Report Craig HACKLERis a 30y.o. male with a history of  encephalopathy who is undergoing an EEG to evaluate for seizures. Report: This EEG was acquired with electrodes placed according to the International 10-20 electrode system (including Fp1, Fp2, F3, F4, C3, C4, P3, P4, O1, O2, T3, T4, T5, T6, A1, A2, Fz, Cz, Pz). The following electrodes were missing or displaced: none. The best background was 8.5 Hz. Later in the recording there are some intervening periods of relative suppression lasting 1-2 seconds. This activity is reactive to stimulation. There was no sleep architecture identified. There is right frontal focal slowing. There are lateralized periodic discharges in the right frontal region q 4 seconds that abate towards the end of the recording. Photic stimulation and hyperventilation were not performed. Impression and clinical correlation: This EEG was obtained while sedated and is abnormal due to: - Lateralized periodic discharges in the right frontal region indicating increased epileptogenic potential in that region - Right focal slowing indicating focal cerebral dysfunction in that region - Brief periods of relative suppression likely indicating medication effect There were no electrographic seizures seen in this recording. CSu Monks MD [facility] [phone] 7pm- 7am, please page neurology on call as listed in [place]     Assessment/Plan: Acute hypoxic and hypercarbic resp falilure on chronic respiratory failure- this episode precipitated by anxiety Pt has underlying OSA but has never been assessed and given CPAP  Seizures- could be related to the acute hypoxia.  EEG done - no seizure activity noted   COVID respiratory illness in 2021 leading to Underlying atelectasis and ILD? need for oxygen /   Recent MSSA bacteremia and Rt septic knee S/p I/D and also received 4 weeks of IV cefazolin- He will need Po for 4 more weeks- While in the hospital continue IV cefzolin- remove PICC once extubated   Anemia   Hypokalemia     ID will follow him peripherally this weekend- call if needed

## 2021-08-11 NOTE — Procedures (Addendum)
Routine EEG Report  Craig [name] is a 30 y.o. male with a history of encephalopathy who is undergoing an EEG to evaluate for seizures.  Report: This EEG was acquired with electrodes placed according to the International 10-20 electrode system (including Fp1, Fp2, F3, F4, C3, C4, P3, P4, O1, O2, T3, T4, T5, T6, A1, A2, Fz, Cz, Pz). The following electrodes were missing or displaced: none.  The best background was 8.5 Hz. Later in the recording there are some intervening periods of relative suppression lasting 1-2 seconds. This activity is reactive to stimulation. There was no sleep architecture identified. There is right frontal focal slowing. There are lateralized periodic discharges in the right frontal region q 4 seconds that abate towards the end of the recording. Photic stimulation and hyperventilation were not performed.   Impression and clinical correlation: This EEG was obtained while sedated and is abnormal due to: - Lateralized periodic discharges in the right frontal region indicating increased epileptogenic potential in that region - Right focal slowing indicating focal cerebral dysfunction in that region - Brief periods of relative suppression likely indicating medication effect  There were no electrographic seizures seen in this recording.  [name], MD [facility] [phone]  If 7pm- 7am, please page neurology on call as listed in [place].

## 2021-08-11 NOTE — Progress Notes (Signed)
[name] called our office for [place] to see Craig [name].  He missed 2 appointments this week for suture removal.  His knee has moderate effusion and pain which is expected with the degenerative changes apparent during his recent arthroscopies. There is mild swelling but no evidence of recurrent infection to the right knee. Orders placed for suture removal Steri-Strip being of the knee, physical therapy can be weightbearing as tolerated if he is able to be mobilized.

## 2021-08-11 NOTE — Progress Notes (Signed)
NAME:  Craig [name], MRN:  [id number], DOB:  [birth year], LOS: 1 ADMISSION DATE:  08/09/2021, INITIAL CONSULTATION DATE:  08/10/21 REFERRING MD: [name], MD CHIEF COMPLAINT: SOB    HPI  Craig y.o with significant extensive medical history including inflammatory arthritis on methotrexate and chronic prednisone at 10 mg daily, prior severe Covid-19 infection requiring prolonged hospitalization and tracheostomy (since decannulated) Dec 2021 - Feb 2022, chronic hypoxic and hypercarpneic respiratory failure on 2L O2 PRN, MSSA bacteremia, MSSA pneumonia, perforated diverticulitis s/p partial colectomy with ostomy creation, morbid obesity and gout who presented to the ED with chief complaints of acute onset shortness of breath.  Per patient's wife at the bedside, patient became anxious and acutely short of breath while transferring form the chair to his new tempur pedic bed. EMS was called and on arrival patient was found to be hypoxic with sats in the mid 80's. Patient suffered what appeared to be Craig generalized tonic clonic seizures while being prepared for transport to the ED. He was placed on NRB mask and bagged during transport.  ED Course: On arrival to the ED, he was afebrile with blood pressure  mm Hg and pulse rate  beats/min. There were no focal neurological deficits; patient was somnolent, not responsive to deep sternal rub or responding to commands. EKG showed sinus tachycardia 143 bpm with Craig narrow QRS, normal axis, slight QTC prolongation otherwise largely normal intervals with nonspecific ST changes no ST elevation. Patient was subsequently intubated with 7.5 cuffed ETT for airway protection. Chest x-ray  post intubation showed vascular congestion.  ET tube in satisfactory position.  PICC line in satisfactory position.  Pertinent Labs in Red/Diagnostics Findings: Na+/ K+: 136/4.3 Glucose: 259 BUN/Cr.: 6/0.63 AST/ALT:57/8   WBC/ TMAX: 22.6/ afebrile Hgb/Hct: 10.3/36.6 PCT:  negative <0.10 Lactic acid: 3.7 COVID PCR: Negative   Troponin:23 Arterial Blood Gas result:  pO2 113; pCO2 63; pH 7.37;  HCO3 36.4, %O2 Sat 98.3.  Patient started on broad spectrum antibiotics. PCCM is asked to admit the patient to ICU for further work up and management of acute hypoxic hypercapnic respiratory failure.  Past Medical History    Acute on chronic respiratory failure with hypoxemia ([place]) 07/08/2021   Neuropathic pain, arm 10/20/2020   Pressure injury of skin 09/18/2020   Staphylococcus aureus bacteremia     Fever     Metabolic encephalopathy     Acute respiratory failure with hypoxia ([place])     Acute hypoxemic respiratory failure due to COVID-19 ([place]) 08/09/2020   Severe sepsis ([place]) 08/09/2020   AKI (acute kidney injury) ([place]) 08/09/2020   Hypertension     Leak of anastomosis between gastrointestinal structures 07/08/2020   Pneumoperitoneum 07/08/2020   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 No acute intracranial pathology. 2. Mild bilateral ethmoid sinus and marked severity sphenoid sinus disease. 12/22: CTA Chest>No evidence of pulmonary embolism. 2. Stable, moderate to marked severity bilateral upper lobe and left lower lobe consolidative atelectatic changes. 3. Hepatic steatosis 12/23: MRI brain, limited study however no intracranial structural abnormality noted.  Sphenoid sinus disease confirmed.  Micro Data:  12/21: SARS-CoV-2 PCR> negative 12/21: Influenza PCR>  negative 12/21: Blood culture x2> 12/21: Urine Culture> 12/22: MRSA PCR>>  12/22: Strep pneumo urinary antigen> 12/22: Legionella urinary antigen>  Antimicrobials:  Vancomycin 12/21 x 1 Aztreonam 12/21 x 1  OBJECTIVE  Blood pressure 127/75, pulse 95, temperature 98.1 F (36.7 C), resp. rate 18, height $RemoveBe'6\' 1"'CBcDRPRQF$  (1.854 m), weight (!) 145.7 kg, SpO2 100 %.    Vent Mode: PRVC FiO2 (%):  [30 %-50 %] 50 % Set Rate:  [18 bmp] 18 bmp Vt Set:  [530 mL] 530 mL PEEP:  [5 cmH20] 5 cmH20 Pressure Support:  [8 cmH20] 8 cmH20 Plateau Pressure:  [23 cmH20-26 cmH20] 26 cmH20   Intake/Output Summary (Last 24 hours) at 08/11/2021 0834 Last data filed at 08/11/2021 0600 Gross per 24 hour  Intake 4115.46 ml  Output 3300 ml  Net 815.46 ml   Filed Weights   08/09/21 2236 08/10/21 0300 08/11/21 0345  Weight: (!) 141.2 kg (!) 141.5 kg (!) 145.7 kg   Physical Examination  GENERAL: 11 [name] critically ill patient lying in the bed intubated, initially noted to be agitated, tachypneic and diaphoretic.  Required resedation. EYES: Pupils equal, round, reactive to light and accommodation. No scleral icterus. Extraocular muscles appear intact.  HEENT: Head atraumatic, normocephalic. Oropharynx and nasopharynx clear.  NECK:  Supple, thick. No thyroid enlargement, trachea midline.  LUNGS: Normal breath sounds bilaterally, no wheezing, rales,rhonchi or crepitation.  Increased work of breathing with SBT . CARDIOVASCULAR: Tachycardic, S1, S2 normal. No murmurs, rubs, or gallops.  ABDOMEN: Soft, Obese, nondistended. Bowel sounds present. No organomegaly or mass. Ostomy present EXTREMITIES: Bilateral lower extremities pitting edema/ Anasarca NEUROLOGIC: No overt focal deficit on limited exam  PSYCHIATRIC: The patient is intubated and sedated, agitated when sedation is off SKIN: As below, present on admission     Labs   CBC: Recent Labs  Lab 08/09/21 2246 08/10/21 0339 08/11/21 0609  WBC 22.6* 9.9 4.1   HGB 10.3* 8.5* 8.4*  HCT 36.6* 29.2* 28.4*  MCV 98.1 93.6 93.4  PLT 368 187 179     Basic Metabolic Panel: Recent Labs  Lab 08/09/21 2246 08/10/21 0339 08/10/21 1434 08/11/21 0609  NA 136 137 141 142  K 4.3 2.5* 3.9 3.6  CL 94* 96* 100 102  CO2 36* 33* 33* 35*  GLUCOSE 259* 106* 163* 126*  BUN 6 6 <5* 6  CREATININE 0.63 0.37* 0.37* 0.39*  CALCIUM 10.0 9.9 9.9 9.4  MG  --  1.3* 1.7 1.8  PHOS  --  2.4* 3.2 3.6    GFR: Estimated Creatinine Clearance: 199.1 mL/min (Craig) (by C-G formula based on SCr of 0.39 mg/dL (L)). Recent Labs  Lab 08/09/21 2246 08/09/21 2247 08/09/21 2321 08/10/21 0043 08/10/21 0050 08/10/21 0339 08/11/21 0609  PROCALCITON  --   --   --   --  0.27  --   --   WBC 22.6*  --   --   --   --  9.9 4.1  LATICACIDVEN  --  3.7* 2.4* 2.0*  --  2.0*  --      Liver Function Tests: Recent Labs  Lab 08/09/21 2246  AST 57*  ALT 8  ALKPHOS 70  BILITOT 0.9  PROT 7.8  ALBUMIN 3.1*    No results for input(s): LIPASE, AMYLASE in the last 168 hours. No results for input(s): AMMONIA in the last 168 hours.  ABG    Component Value Date/Time  PHART 7.37 08/09/2021 2344   PCO2ART 63 (H) 08/09/2021 2344   PO2ART 113 (H) 08/09/2021 2344   HCO3 36.4 (H) 08/09/2021 2344   TCO2 41 (H) 07/18/2021 0843   O2SAT 98.3 08/09/2021 2344      Coagulation Profile: Recent Labs  Lab 08/09/21 2313  INR 1.2     Cardiac Enzymes: No results for input(s): CKTOTAL, CKMB, CKMBINDEX, TROPONINI in the last 168 hours.  HbA1C: Hgb A1c MFr Bld  Date/Time Value Ref Range Status  07/08/2021 06:29 PM 6.8 (H) 4.8 - 5.6 % Final    Comment:    (NOTE) Pre diabetes:          5.7%-6.4%  Diabetes:              >6.4%  Glycemic control for   <7.0% adults with diabetes   08/10/2020 04:33 AM 5.7 (H) 4.8 - 5.6 % Final    Comment:    (NOTE) Pre diabetes:          5.7%-6.4%  Diabetes:              >6.4%  Glycemic control for   <7.0% adults with diabetes      CBG: Recent Labs  Lab 08/10/21 1536 08/10/21 1930 08/10/21 2321 08/11/21 0311 08/11/21 0720  GLUCAP 161* 164* 160* 142* 128*     Review of Systems:   UNABLE TO OBTAIN DUE TO PATIENT BEING ON THE VENT AND SEDATE  Allergies Allergies  Allergen Reactions   Tramadol Hcl     SEIZURES!!! DO NOT REORDER AT DISCHARGE!!!   Amoxicillin Rash    Tolerated cefepime and cefazolin 08/2020.     Home Medications  Prior to Admission medications   Medication Sig Start Date End Date Taking? Authorizing Provider  acetaminophen (TYLENOL) 650 MG CR tablet Take 650 mg by mouth every 8 (eight) hours as needed. 10/14/20  Yes [provider]  acetaZOLAMIDE (DIAMOX) 250 MG tablet Take 1 tablet (250 mg total) by mouth 2 (two) times daily. 07/25/21  Yes Swayze, Ava, DO  albuterol (ACCUNEB) 1.25 MG/3ML nebulizer solution Take 1 ampule by nebulization 3 (three) times daily as needed. 03/07/21  Yes [provider]  allopurinol (ZYLOPRIM) 300 MG tablet Take 300 mg by mouth daily. 02/09/21  Yes [provider]  ascorbic acid (VITAMIN C) 500 MG tablet Take 1 tablet (500 mg total) by mouth 2 (two) times daily. 07/25/21  Yes Swayze, Ava, DO  budesonide (PULMICORT) 0.25 MG/2ML nebulizer solution Take 0.25 mg by nebulization daily. 03/07/21 03/07/22 Yes [provider]  cefadroxil (DURICEF) 1 g tablet Take 1 tablet (1 g total) by mouth 2 (two) times daily. 08/08/21  Yes Tsosie Billing, MD  cetirizine (ZYRTEC) 10 MG tablet Take 10 mg by mouth daily.   Yes [provider]  Cholecalciferol (VITAMIN D3) 25 MCG (1000 UT) CAPS Take 1 capsule by mouth daily.   Yes [provider]  [employer] SALINE NASAL SPRAY 0.65 % nasal spray SMARTSIG:1 Spray(s) Both Nares Every 4 Hours PRN 10/14/20  Yes [provider]  DULoxetine (CYMBALTA) 60 MG capsule Take 60 mg by mouth daily. 11/14/20 11/14/21 Yes [provider]  famotidine (PEPCID) 20 MG tablet Take 20 mg by  mouth 2 (two) times daily. 10/14/20  Yes [provider]  folic acid (FOLVITE) 1 MG tablet Take 1 mg by mouth daily. 03/01/21 03/01/22 Yes [provider]  furosemide (LASIX) 40 MG tablet Take 1 tablet (40 mg total) by mouth 2 (two) times daily.  Patient taking differently: Take 40 mg by mouth daily. 07/25/21  Yes Swayze, Ava, DO  lidocaine (LIDODERM) 5 % 1 patch daily as needed. 07/07/21  Yes [provider]  melatonin 3 MG TABS tablet Take 6 mg by mouth at bedtime as needed. 10/14/20  Yes [provider]  metoprolol succinate (TOPROL-XL) 100 MG 24 hr tablet Take 100 mg by mouth daily. 01/05/21 01/05/22 Yes [provider]  Multiple Vitamins-Minerals (MULTIVITAMIN WITH MINERALS) tablet Take 1 tablet by mouth daily.   Yes [provider]  nicotine (NICODERM CQ - DOSED IN MG/24 HOURS) 21 mg/24hr patch Place 1 patch (21 mg total) onto the skin daily. 07/26/21  Yes Swayze, Ava, DO  nystatin (MYCOSTATIN/NYSTOP) powder Apply topically 3 (three) times daily. 07/25/21  Yes Swayze, Ava, DO  Omega-3 Fatty Acids (FISH OIL) 1000 MG CAPS Take 1 capsule by mouth daily.   Yes [provider]  ondansetron (ZOFRAN) 4 MG tablet Take 4 mg by mouth every 8 (eight) hours as needed. 10/14/20  Yes [provider]  oxyCODONE 10 MG TABS Take 0.5-1 tablets (5-10 mg total) by mouth every 6 (six) hours as needed for moderate pain or severe pain. 07/25/21  Yes Swayze, Ava, DO  polyethylene glycol (MIRALAX / GLYCOLAX) 17 g packet Take 17 g by mouth daily. 07/25/21  Yes Swayze, Ava, DO  potassium chloride 20 MEQ TBCR Take 10 mEq by mouth 2 (two) times daily. 07/25/21  Yes Swayze, Ava, DO  pregabalin (LYRICA) 200 MG capsule Take 200 mg by mouth 2 (two) times daily. 03/01/21 03/01/22 Yes [provider]  rivaroxaban (XARELTO) 20 MG TABS tablet Take 20 mg by mouth daily with supper. 11/07/20  Yes [provider]  traMADol (ULTRAM) 50 MG tablet Take 50 mg by  mouth 2 (two) times daily as needed. 08/08/21  Yes [provider]  blood glucose meter kit and supplies KIT Dispense based on patient and insurance preference. Use up to four times daily as directed. 07/26/21   Richarda Osmond, MD  clonazePAM (KLONOPIN) 1 MG tablet Take by mouth. Patient not taking: Reported on 08/09/2021 10/14/20   [provider]  insulin aspart (NOVOLOG) 100 UNIT/ML injection Inject 10 Units into the skin 3 (three) times daily with meals. Patient not taking: Reported on 08/08/2021 07/25/21   Swayze, Ava, DO  insulin glargine (LANTUS) 100 UNIT/ML Solostar Pen Inject 40 Units into the skin daily. Patient not taking: Reported on 08/08/2021 07/27/21   Richarda Osmond, MD   Scheduled Meds:  budesonide (PULMICORT) nebulizer solution  0.25 mg Nebulization BID   chlorhexidine gluconate (MEDLINE KIT)  15 mL Mouth Rinse BID   Chlorhexidine Gluconate Cloth  6 each Topical Daily   docusate  100 mg Per Tube BID   furosemide  20 mg Intravenous BID   heparin  5,000 Units Subcutaneous Q8H   insulin aspart  0-20 Units Subcutaneous Q4H   LORazepam       mouth rinse  15 mL Mouth Rinse 10 times per day   methylPREDNISolone (SOLU-MEDROL) injection  20 mg Intravenous Q12H   pantoprazole (PROTONIX) IV  40 mg Intravenous QHS   polyethylene glycol  17 g Per Tube Daily   Continuous Infusions:   ceFAZolin (ANCEF) IV 2 g (08/11/21 8768)   dexmedetomidine (PRECEDEX) IV infusion 1 mcg/kg/hr (08/11/21 0819)   fentaNYL infusion INTRAVENOUS 175 mcg/hr (08/11/21 0445)   levETIRAcetam Stopped (08/11/21 0358)   magnesium sulfate bolus IVPB     midazolam 7 mg/hr (  08/11/21 0609)   norepinephrine (LEVOPHED) Adult infusion Stopped (08/11/21 0200)   potassium chloride     propofol (DIPRIVAN) infusion Stopped (08/11/21 0355)   PRN Meds:.fentaNYL, ipratropium-albuterol, midazolam   Assessment & Plan:  Acute on chronic hypoxic and hypercarbic respiratory failure probable  Pneumonia vs Anxiety State? PMHx: Covid-19 pneumonia, MSSA pneumonia, Morbid obesity, probable OHS/OSA,  -continue ventilator support & lung protective strategies -Wean PEEP & FiO2 as tolerated, maintain SpO2 > 90% -Head of bed elevated 30 degrees, VAP protocol in place -Plateau pressures less than 30 cm H20  -Intermittent chest x-ray & ABG PRN -Daily WUA with SBT per protocol -Ensure adequate pulmonary hygiene  -Bronchodilators PRN -PAD protocol in place; wean sedation/analgesia for RASS goal 0   Sepsis 2/2 MSSA bacteremia with MSSA right knee septic arthritis s/p I&D x2 Lactic acidosis Patient seen by ID on 12/20. Was to complete IV Cefazolin on 12/22 then plan to transition to Cefadroxil -Received Vanc and Aztreonam x 1 in the ED. Pending ID recs for further abx guidance -TEE on 11/23 negative -F/u blood cultures until clear; follow up trach aspirate, strep pneumo and Legionella antigen negative -Monitor WBC/ fever curve -Strict I/O's -Pressors for MAP goal >65 -Continue steroids per home; no indication for stress dose steroids at this time   Acute metabolic encephalopathy likely multifactorial in the setting of sepsis Baseline issues with anxiety/impulsivity -CT Head negative for acute abnormality -Limited MRI negative for acute abnormality -EEG without seizure activity -Goal RASS 0 -Supportive care -DC Keppra, can cause agitation -Depacon for mood stabilization -Resume Klonopin -As needed Ativan -Precedex, titrate as able  Seizure Like Activity Similar presentation during last admission, Tramadol was previously discontinued due to suspected seizures, it appears that this was restarted, now DC'd. -CT Head shows no acute intracranial abnormality -MRI shows no acute abnormality though limited study -EEG showed no seizure activity -May have been myoclonic activity due to hypoxia -Discontinue Keppra -Depacon 500 mg every 12 -Sedation as above weaning as able -Seizure  precautions  H/o upper extremity DVT It appears that he was started on Xarelto before around Feb/Mar 2022 for provoked upper extremity DVT.   -CTA chest neg for PE -Resume Xarelto today  Anasarca Slightly worse, per significant other, patient has not been taking his Lasix or Diamox due to poor po intake. -Adjusted diuretics, Lasix 40 mg IV every 12 -Acetazolamide as needed  Diabetes mellitus Last HgbA1c 6.8 on 07/08/21 -CBGs -Sliding scale insulin -Follow ICU hyper/hypoglycemia protocol  target goal 140 - 180 mg/dL -Hold home Meds    Best practice:  Diet: Tube feeds per dietitian Pain/Anxiety/Delirium protocol (if indicated): Yes (RASS goal 0) VAP protocol (if indicated): Yes DVT prophylaxis: Subcutaneous Heparin, discontinue when Xarelto started GI prophylaxis: PPI Glucose control:  SSI Yes Central venous access:  Yes, and it is still needed Arterial line:  N/Craig Foley:  Yes, and it is still needed Mobility:  bed rest  PT consulted: N/Craig Last date of multidisciplinary goals of care discussion [12/22] Code Status:  full code Disposition: ICU   = Goals of Care = Code Status Order: FULL   Primary Emergency Contact: [place], Home Phone: [phone] Wishes to pursue full aggressive treatment and intervention options, including CPR and intubation, but goals of care will be addressed on going with family if that should become necessary.  Critical care time: 45 minutes    Multidisciplinary rounds were performed with ICU team  The patient is critically ill with multiple organ systems failure and requires high complexity  decision making for assessment and support, frequent evaluation and titration of therapies, application of advanced monitoring technologies and extensive interpretation of multiple databases. Critical Care Time devoted to patient care services described in this note is 45 minutes.  Renold Don, MD Advanced Bronchoscopy PCCM [place]  [facility]    *This note was dictated using voice recognition software/Dragon.  Despite best efforts to proofread, errors can occur which can change the meaning. Any transcriptional errors that result from this process are unintentional and may not be fully corrected at the time of dictation.

## 2021-08-11 NOTE — Progress Notes (Signed)
[place] [facility] Hospital Liaison Note  Notified of patient/family request for [facility] Palliative services at home after discharge.  [facility] hospital liaison will follow patient for discharge disposition.  Please call with any hospice or outpatient palliative care related questions.  Thank you for the opportunity to participate in this patient's care.  Nadene Rubins, RN, BSN [place] [phone]

## 2021-08-11 NOTE — Progress Notes (Addendum)
Nutrition Follow-up  DOCUMENTATION CODES:   Morbid obesity  INTERVENTION:   Initiate Vital HP @ 82m/hr + ProSource TF 964mTID via tube   Propofol: 25.4 ml/hr- provides 670kcal/day   Free water flushes 3073m4 hours to maintain tube patency   Regimen provides 2110kcal/day, 171g/day protein and 1183m43my free water   Juven Fruit Punch BID via tube, each serving provides 95kcal and 2.5g of protein (amino acids glutamine and arginine)  Pt at high refeed risk; recommend monitor potassium, magnesium and phosphorus labs daily until stable  Liquid MVI daily via tube   NUTRITION DIAGNOSIS:   Inadequate oral intake related to inability to eat as evidenced by NPO status.  GOAL:   Provide needs based on ASPEN/SCCM guidelines -not met   MONITOR:   Vent status, Labs, Weight trends, TF tolerance, Skin, I & O's  ASSESSMENT:   30 y38. 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  Pt is well known to this RD from previous admits. Pt sedated and ventilated. OGT in place. Plan is to start tube feeds later today. Pt is likely at refeed risk. Per chart, pt is up ~70lbs from his UBW; RD unsure if bed weights are correct; pt is noted to have anasarca. Pt + 4.3L on his I & Os.   Medications reviewed and include: lasix, heparin, solu-medrol, protonix, cefazolin, propofol  Labs reviewed: K 3.6 wnl, creat 0.39(L), P 3.6  wnl, Mg 1.8 wnl Hgb 8.4(L), Hct 28.4(L) Cbgs- 180, 128, 142 x 24 hrs AIC 6.8(H)- 11/19  Patient is currently intubated on ventilator support MV: 10.4 L/min Temp (24hrs), Avg:97.9 F (36.6 C), Min:97.3 F (36.3 C), Max:99.1 F (37.3 C)  Propofol: 25.4 ml/hr- provides 670kcal/day   MAP- >65mm35m UOP- 3875ml 64met Order:   Diet Order             Diet NPO time specified  Diet effective now                  EDUCATION NEEDS:   Not appropriate for education at this time  Skin:  Skin Integrity Issues:: Stage II, Incisions Stage II: coccyx Incisions: closed rt knee  Last BM:  08/10/21 (via colostomy)  Height:   Ht Readings from Last 1 Encounters:  08/09/21 _0  (1.854 m)    Weight:   Wt Readings from Last 1 Encounters:  08/11/21 (!) 145.7 kg    Ideal Body Weight:  83.6 kg  BMI:  Body mass index is 42.38 kg/m.  Estimated Nutritional Needs:   Kcal:  1602-2039kcal/day  Protein:  167-209g/day  Fluid:  2.5-2.8L/day  [place]en DistanceD, LDN Please refer to [facility] and/or RD on-call/weekend/after hours pager

## 2021-08-11 NOTE — Consult Note (Signed)
PHARMACY CONSULT NOTE  Pharmacy Consult for Electrolyte Monitoring and Replacement   Recent Labs: Potassium (mmol/L)  Date Value  08/11/2021 3.6   Magnesium (mg/dL)  Date Value  08/11/2021 1.8   Calcium (mg/dL)  Date Value  08/11/2021 9.4   Albumin (g/dL)  Date Value  08/09/2021 3.1 (L)   Phosphorus (mg/dL)  Date Value  08/11/2021 3.6   Sodium (mmol/L)  Date Value  08/11/2021 142     Assessment: 30 year-old male with PMH of inflammatory arthritis, chronic hypoxic and hypercarpneic respiratory failure on 2L O2 PRN, MSSA bacteremia, MSSA pneumonia, perforated diverticulitis s/p partial colectomy with ostomy creation, morbid obesity and gout who was admitted to the hospital for SOB and was subsequently intubated. Pharmacy was consulted for electrolyte monitoring. Patient is currently intubated on ventilator support. OGT connected to low, intermittent suction  diuretics: furosemide 40mg  IV BID (increased 12/23)  Goal of Therapy:  Potassium 4.0 - 5.1 mmol/L Magnesium 2.0 - 2.4 mg/dL All Other Electrolytes WNL  Plan:  2 grams IV magnesium sulfate x 1 10 mEq IV KCl x 3 Recheck electrolytes in am  continue to monitor and replace electrolytes as clinically indicated    Dallie Piles ,PharmD Clinical Pharmacist 08/11/2021 7:06 AM

## 2021-08-11 NOTE — Consult Note (Signed)
Consultation Note Date: 08/11/2021   Patient Name: Craig [name]  DOB: 1990/12/07  MRN: [id number]  Age / Sex: 30 y.o., male  PCP: [name], MD Referring Physician: Tyler Pita, MD  Reason for Consultation: Establishing goals of care and Psychosocial/spiritual support  HPI/Patient Profile: 30 y.o. male  with past medical history of inflammatory arthritis on methotrexate and chronic prednisone at 10 mg daily, prior severe COVID infection requiring prolonged hospitalization with trach/since decannulated December 2021 through February 2022, chronic hypoxic/hypercarbic neck respiratory failure on O2 at 2 L as needed, history of MSSA bacteremia, MSSA pneumonia, perforated diverticula status post partial colectomy with ostomy, morbid obesity, gout admitted on 08/09/2021 with severe sepsis secondary to MSSA bacteremia with MSSA right knee septic arthritis status post I&D x2.   Clinical Assessment and Goals of Care: I have reviewed medical records including EPIC notes, labs and imaging, received report from RN, assessed the patient.  Gerald Stabs is sitting up in bed intubated/ventilated.  His eyes are open and he is able to participate in care.  Nursing staff is at bedside attending to needs.  I am not sure that he is able to make his needs known adequately.  Present today at bedside his wife, [name].  We meet to discuss diagnosis prognosis, GOC, EOL wishes, disposition and options.  I introduced Palliative Medicine as specialized medical care for people living with serious illness. It focuses on providing relief from the symptoms and stress of a serious illness. The goal is to improve quality of life for both the patient and the family.  We discussed their current illness, the treatment plan, time for outcomes.   Advanced directives, concepts specific to code status, continue full scope/full  code  Palliative Care services outpatient were explained and offered.  We talked about nurse practitioner coming to evaluate/offer support monthly.  "An extra layer of support".  Wife is agreeable to outpatient palliative services as an extra layer of support.  Provider choice offered, [facility] provider chosen.  Discussed the importance of continued conversation with family and the medical providers regarding overall plan of care and treatment options, ensuring decisions are within the context of the patients values and GOCs. Questions and concerns were addressed. The family was encouraged to call with questions or concerns.  PMT will continue to support holistically.  Conference with attending, bedside nursing staff, transition of care team related to patient condition, needs, goals of care.   HCPOA  NEXT OF KIN -wife, [name].    SUMMARY OF RECOMMENDATIONS   At this point full scope/full code Time for outcomes. PMT to shadow.   Code Status/Advance Care Planning: Full code  Symptom Management:  Per hospitalist/CCM, no additional needs at this time.  Palliative Prophylaxis:  Frequent Pain Assessment, Oral Care, and Palliative Wound Care  Additional Recommendations (Limitations, Scope, Preferences): Full Scope Treatment  Psycho-social/Spiritual:  Desire for further Chaplaincy support:no Additional Recommendations: Caregiving  Support/Resources  Prognosis:  Unable to determine, based on outcomes.  Guarded at this point.  Discharge Planning:  To be determined, based on outcomes.       Primary Diagnoses: Present on Admission:  Acute on chronic respiratory failure with hypoxia and hypercapnia ([place])   I have reviewed the medical record, interviewed the patient and family, and examined the patient. The following aspects are pertinent.  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   Vaping Use   Vaping Use: Never used  Substance and Sexual Activity   Alcohol use: Yes    Comment: pint to a fifth per day per patient . Patient reports he hasn't had anything to drink in three weeks.   Drug use: Never   Sexual activity: Yes    Birth control/protection: None  Other Topics Concern   Not on file  Social History Narrative   Not on file   Social Determinants of Health   Financial Resource Strain: Not on file  Food Insecurity: Not on file  Transportation Needs: Not on file  Physical Activity: Not on file  Stress: Not on file  Social Connections: Not on file   Family History  Problem Relation Age of Onset   Healthy Mother    Healthy Father    Scheduled Meds:  arformoterol  15 mcg Nebulization BID   budesonide (PULMICORT) nebulizer solution  0.25 mg Nebulization BID   chlorhexidine gluconate (MEDLINE KIT)  15 mL Mouth Rinse BID   Chlorhexidine Gluconate Cloth  6 each Topical Daily   clonazePAM  1 mg Per Tube BID   docusate  100 mg Per Tube BID   furosemide  40 mg Intravenous BID   insulin aspart  0-20 Units Subcutaneous Q4H   LORazepam       mouth rinse  15 mL Mouth Rinse 10 times per day   methylPREDNISolone (SOLU-MEDROL) injection  20 mg Intravenous Q12H   pantoprazole (PROTONIX) IV  40 mg Intravenous QHS   polyethylene glycol  17 g Per Tube Daily   rivaroxaban  20 mg Per Tube Daily   Continuous Infusions:   ceFAZolin (ANCEF) IV 2 g (08/11/21 1506)   dexmedetomidine (PRECEDEX) IV infusion 1.2 mcg/kg/hr (08/11/21 1459)   fentaNYL infusion INTRAVENOUS 175 mcg/hr (08/11/21 0445)   norepinephrine (LEVOPHED) Adult infusion Stopped  (08/11/21 0200)   potassium chloride 10 mEq (08/11/21 1454)   propofol (DIPRIVAN) infusion 30 mcg/kg/min (08/11/21 1230)   valproate sodium 500 mg (08/11/21 1054)   PRN Meds:.docusate, fentaNYL (SUBLIMAZE) injection, fentaNYL (SUBLIMAZE) injection, gadobutrol, LORazepam, polyethylene glycol, vecuronium Medications Prior to Admission:  Prior to Admission medications   Medication Sig Start Date End Date Taking? Authorizing Provider  acetaminophen (TYLENOL) 650 MG CR tablet Take 650 mg by mouth every 8 (eight) hours as needed. 10/14/20  Yes [provider]  acetaZOLAMIDE (DIAMOX) 250 MG tablet Take 1 tablet (250 mg total) by mouth 2 (two) times daily. 07/25/21  Yes Swayze, Ava, DO  albuterol (ACCUNEB) 1.25 MG/3ML nebulizer solution Take 1 ampule by nebulization 3 (three) times daily  as needed. 03/07/21  Yes [provider]  allopurinol (ZYLOPRIM) 300 MG tablet Take 300 mg by mouth daily. 02/09/21  Yes [provider]  ascorbic acid (VITAMIN C) 500 MG tablet Take 1 tablet (500 mg total) by mouth 2 (two) times daily. 07/25/21  Yes Swayze, Ava, DO  budesonide (PULMICORT) 0.25 MG/2ML nebulizer solution Take 0.25 mg by nebulization daily. 03/07/21 03/07/22 Yes [provider]  cefadroxil (DURICEF) 1 g tablet Take 1 tablet (1 g total) by mouth 2 (two) times daily. 08/08/21  Yes Tsosie Billing, MD  cetirizine (ZYRTEC) 10 MG tablet Take 10 mg by mouth daily.   Yes [provider]  Cholecalciferol (VITAMIN D3) 25 MCG (1000 UT) CAPS Take 1 capsule by mouth daily.   Yes [provider]  [employer] SALINE NASAL SPRAY 0.65 % nasal spray SMARTSIG:1 Spray(s) Both Nares Every 4 Hours PRN 10/14/20  Yes [provider]  DULoxetine (CYMBALTA) 60 MG capsule Take 60 mg by mouth daily. 11/14/20 11/14/21 Yes [provider]  famotidine (PEPCID) 20 MG tablet Take 20 mg by mouth 2 (two) times daily. 10/14/20  Yes [provider]  folic acid (FOLVITE) 1  MG tablet Take 1 mg by mouth daily. 03/01/21 03/01/22 Yes [provider]  furosemide (LASIX) 40 MG tablet Take 1 tablet (40 mg total) by mouth 2 (two) times daily. Patient taking differently: Take 40 mg by mouth daily. 07/25/21  Yes Swayze, Ava, DO  lidocaine (LIDODERM) 5 % 1 patch daily as needed. 07/07/21  Yes [provider]  melatonin 3 MG TABS tablet Take 6 mg by mouth at bedtime as needed. 10/14/20  Yes [provider]  metoprolol succinate (TOPROL-XL) 100 MG 24 hr tablet Take 100 mg by mouth daily. 01/05/21 01/05/22 Yes [provider]  Multiple Vitamins-Minerals (MULTIVITAMIN WITH MINERALS) tablet Take 1 tablet by mouth daily.   Yes [provider]  nicotine (NICODERM CQ - DOSED IN MG/24 HOURS) 21 mg/24hr patch Place 1 patch (21 mg total) onto the skin daily. 07/26/21  Yes Swayze, Ava, DO  nystatin (MYCOSTATIN/NYSTOP) powder Apply topically 3 (three) times daily. 07/25/21  Yes Swayze, Ava, DO  Omega-3 Fatty Acids (FISH OIL) 1000 MG CAPS Take 1 capsule by mouth daily.   Yes [provider]  ondansetron (ZOFRAN) 4 MG tablet Take 4 mg by mouth every 8 (eight) hours as needed. 10/14/20  Yes [provider]  oxyCODONE 10 MG TABS Take 0.5-1 tablets (5-10 mg total) by mouth every 6 (six) hours as needed for moderate pain or severe pain. 07/25/21  Yes Swayze, Ava, DO  polyethylene glycol (MIRALAX / GLYCOLAX) 17 g packet Take 17 g by mouth daily. 07/25/21  Yes Swayze, Ava, DO  potassium chloride 20 MEQ TBCR Take 10 mEq by mouth 2 (two) times daily. 07/25/21  Yes Swayze, Ava, DO  pregabalin (LYRICA) 200 MG capsule Take 200 mg by mouth 2 (two) times daily. 03/01/21 03/01/22 Yes [provider]  rivaroxaban (XARELTO) 20 MG TABS tablet Take 20 mg by mouth daily with supper. 11/07/20  Yes [provider]  blood glucose meter kit and supplies KIT Dispense based on patient and insurance preference. Use up to four times daily as directed.  07/26/21   Richarda Osmond, MD  clonazePAM (KLONOPIN) 1 MG tablet Take by mouth. Patient not taking: Reported on 08/09/2021 10/14/20   [provider]  insulin aspart (NOVOLOG) 100 UNIT/ML injection Inject 10 Units into the skin 3 (three) times daily with meals.  Patient not taking: Reported on 08/08/2021 07/25/21   Swayze, Ava, DO  insulin glargine (LANTUS) 100 UNIT/ML Solostar Pen Inject 40 Units into the skin daily. Patient not taking: Reported on 08/08/2021 07/27/21   Richarda Osmond, MD   Allergies  Allergen Reactions   Tramadol Hcl     SEIZURES!!! DO NOT REORDER AT DISCHARGE!!!   Amoxicillin Rash    Tolerated cefepime and cefazolin 08/2020.   Review of Systems  Unable to perform ROS: Intubated   Physical Exam Vitals and nursing note reviewed.  Constitutional:      General: He is not in acute distress.    Appearance: He is obese. He is ill-appearing.  Cardiovascular:     Rate and Rhythm: Normal rate.  Pulmonary:     Comments: Intubated/ventilated Musculoskeletal:        General: Swelling present.  Skin:    Comments: Multiple wounds as noted by nursing  Neurological:     Mental Status: He is alert.     Comments: Intubated/ventilated    Vital Signs: BP 117/64    Pulse 70    Temp 99.1 F (37.3 C)    Resp 18    Ht '6\' 1"'  (1.854 m)    Wt (!) 145.7 kg    SpO2 100%    BMI 42.38 kg/m  Pain Scale: CPOT   Pain Score: 0-No pain   SpO2: SpO2: 100 % O2 Device:SpO2: 100 % O2 Flow Rate: .   IO: Intake/output summary:  Intake/Output Summary (Last 24 hours) at 08/11/2021 1529 Last data filed at 08/11/2021 1234 Gross per 24 hour  Intake 2703.52 ml  Output 2895 ml  Net -191.48 ml    LBM: Last BM Date: 08/10/21 Baseline Weight: Weight: (!) 141.2 kg Most recent weight: Weight: (!) 145.7 kg     Palliative Assessment/Data:   Flowsheet Rows    Flowsheet Row Most Recent Value  Intake Tab   Referral Department Hospitalist  Unit at Time of Referral ICU   Palliative Care Primary Diagnosis Sepsis/Infectious Disease  Date Notified 08/11/21  Palliative Care Type New Palliative care  Reason for referral Clarify Goals of Care  Date of Admission 08/09/21  Date first seen by Palliative Care 08/11/21  # of days Palliative referral response time 0 Day(s)  # of days IP prior to Palliative referral 2  Clinical Assessment   Palliative Performance Scale Score 40%  Pain Max last 24 hours Not able to report  Pain Min Last 24 hours Not able to report  Dyspnea Max Last 24 Hours Not able to report  Dyspnea Min Last 24 hours Not able to report  Psychosocial & Spiritual Assessment   Palliative Care Outcomes        Time In: 1050 Time Out: 1120 Time Total: 30 minutes  Greater than 50%  of this time was spent counseling and coordinating care related to the above assessment and plan.  Signed by: Drue Novel, NP   Please contact Palliative Medicine Team phone at [phone] for questions and concerns.  For individual provider: See Shea Evans

## 2021-08-11 NOTE — Progress Notes (Addendum)
°  Mr. Dusza presents with chronic hypoxic and hypercarpneic respiratory failure secondary to obesity hypoventilation syndrome.  The use of the NIV will treat patients high PC02 levels (63 on 08/09/21 with elevated bicarb of 36.4 while on a ventilator) and can reduce risk of exacerbations and future hospitalizations when used at night and during the day.  All alternate devices [phone] and [id number]) have been proven ineffective to provide essential volume control necessary to maintain acceptable CO2 levels.  An NIV with IVAPS is necessary to prevent patient from life-threatening harm.  Interruption or failure to provide NIV would quickly lead to exacerbation of the patients condition, hospital admission, and likely harm to the patient. Furthermore, [employer] Breathe A Little Easier Program has shown an 80.1% readmission reduction locally for all patients.  Continued use is preferred.  Patient is able to protect their airways and clear secretions on their own.  [place], [place]

## 2021-08-12 LAB — BASIC METABOLIC PANEL
Anion gap: 4 — ABNORMAL LOW (ref 5–15)
BUN: 11 mg/dL (ref 6–20)
CO2: 36 mmol/L — ABNORMAL HIGH (ref 22–32)
Calcium: 9.2 mg/dL (ref 8.9–10.3)
Chloride: 100 mmol/L (ref 98–111)
Creatinine, Ser: 0.46 mg/dL — ABNORMAL LOW (ref 0.61–1.24)
GFR, Estimated: >60 mL/min (ref >60)
Glucose, Bld: 164 mg/dL — ABNORMAL HIGH (ref 70–99)
Potassium: 3.8 mmol/L (ref 3.5–5.1)
Sodium: 140 mmol/L (ref 135–145)

## 2021-08-12 LAB — CBC
HCT: 30.9 % — ABNORMAL LOW (ref 39.0–52.0)
Hemoglobin: 9.2 g/dL — ABNORMAL LOW (ref 13.0–17.0)
MCH: 27.9 pg (ref 26.0–34.0)
MCHC: 29.8 g/dL — ABNORMAL LOW (ref 30.0–36.0)
MCV: 93.6 fL (ref 80.0–100.0)
Platelets: 183 10*3/uL (ref 150–400)
RBC: 3.3 MIL/uL — ABNORMAL LOW (ref 4.22–5.81)
RDW: 17.9 % — ABNORMAL HIGH (ref 11.5–15.5)
WBC: 5.5 10*3/uL (ref 4.0–10.5)
nRBC: 0 % (ref 0.0–0.2)

## 2021-08-12 LAB — GLUCOSE, CAPILLARY
Glucose-Capillary: 156 mg/dL — ABNORMAL HIGH (ref 70–99)
Glucose-Capillary: 165 mg/dL — ABNORMAL HIGH (ref 70–99)
Glucose-Capillary: 181 mg/dL — ABNORMAL HIGH (ref 70–99)
Glucose-Capillary: 156 mg/dL — ABNORMAL HIGH (ref 70–99)
Glucose-Capillary: 146 mg/dL — ABNORMAL HIGH (ref 70–99)
Glucose-Capillary: 160 mg/dL — ABNORMAL HIGH (ref 70–99)

## 2021-08-12 LAB — PHOSPHORUS: Phosphorus: 4.9 mg/dL — ABNORMAL HIGH (ref 2.5–4.6)

## 2021-08-12 LAB — MAGNESIUM: Magnesium: 1.7 mg/dL (ref 1.7–2.4)

## 2021-08-12 MED ORDER — FREE WATER
30.0000 mL | Status: DC
  Administered 2021-08-12 – 2021-08-14 (×11): 30 mL

## 2021-08-12 MED ORDER — PROSOURCE TF PO LIQD
45.0000 mL | Freq: Three times a day (TID) | ORAL | Status: DC
  Administered 2021-08-12 – 2021-08-13 (×4): 45 mL
  Filled 2021-08-12 (×8): qty 45

## 2021-08-12 MED ORDER — MAGNESIUM SULFATE 4 GM/100ML IV SOLN
4.0000 g | Freq: Once | INTRAVENOUS | Status: AC
  Administered 2021-08-12: 09:00:00 4 g via INTRAVENOUS
  Filled 2021-08-12: qty 100

## 2021-08-12 MED ORDER — POTASSIUM CHLORIDE 20 MEQ PO PACK
40.0000 meq | PACK | Freq: Two times a day (BID) | ORAL | Status: AC
  Administered 2021-08-12 (×2): 40 meq
  Filled 2021-08-12 (×2): qty 2

## 2021-08-12 MED ORDER — PROPOFOL 1000 MG/100ML IV EMUL
5.0000 ug/kg/min | INTRAVENOUS | Status: DC
  Administered 2021-08-13: 22:00:00 25 ug/kg/min via INTRAVENOUS
  Administered 2021-08-13: 18:00:00 20 ug/kg/min via INTRAVENOUS
  Administered 2021-08-13: 5 ug/kg/min via INTRAVENOUS
  Administered 2021-08-13: 06:00:00 15 ug/kg/min via INTRAVENOUS
  Administered 2021-08-14: 07:00:00 25 ug/kg/min via INTRAVENOUS
  Filled 2021-08-12 (×6): qty 100

## 2021-08-12 MED ORDER — JUVEN PO PACK
1.0000 | PACK | Freq: Two times a day (BID) | ORAL | Status: DC
  Administered 2021-08-12 – 2021-08-14 (×4): 1

## 2021-08-12 MED ORDER — VITAL HIGH PROTEIN PO LIQD
1000.0000 mL | ORAL | Status: DC
Start: 2021-08-12 — End: 2021-08-14
  Administered 2021-08-12 – 2021-08-13 (×2): 1000 mL

## 2021-08-12 NOTE — Progress Notes (Signed)
NAME:  Craig [name], MRN:  [id number], DOB:  05/22/1991, LOS: 2 ADMISSION DATE:  08/09/2021, INITIAL CONSULTATION DATE:  08/10/21 REFERRING MD: [name], MD CHIEF COMPLAINT: SOB    HPI  30 y.o with significant extensive medical history including inflammatory arthritis on methotrexate and chronic prednisone at 10 mg daily, prior severe Covid-19 infection requiring prolonged hospitalization and tracheostomy (since decannulated) Dec 2021 - Feb 2022, chronic hypoxic and hypercarpneic respiratory failure on 2L O2 PRN, MSSA bacteremia, MSSA pneumonia, perforated diverticulitis s/p partial colectomy with ostomy creation, morbid obesity and gout who presented to the ED with chief complaints of acute onset shortness of breath.  Per patient's wife at the bedside, patient became anxious and acutely short of breath while transferring form the chair to his new tempur pedic bed. EMS was called and on arrival patient was found to be hypoxic with sats in the mid 80's. Patient suffered what appeared to be a generalized tonic clonic seizures while being prepared for transport to the ED. He was placed on NRB mask and bagged during transport.  ED Course: On arrival to the ED, he was afebrile with blood pressure  mm Hg and pulse rate  beats/min. There were no focal neurological deficits; patient was somnolent, not responsive to deep sternal rub or responding to commands. EKG showed sinus tachycardia 143 bpm with a narrow QRS, normal axis, slight QTC prolongation otherwise largely normal intervals with nonspecific ST changes no ST elevation. Patient was subsequently intubated with 7.5 cuffed ETT for airway protection. Chest x-ray  post intubation showed vascular congestion.  ET tube in satisfactory position.  PICC line in satisfactory position.  Patient started on broad spectrum antibiotics. PCCM is asked to admit the patient to ICU for further work up and management of acute hypoxic hypercapnic  respiratory failure.  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   Chronic gouty arthritis 02/17/2020   Class 2 severe obesity with serious comorbidity in adult [facility]) 02/17/2020   Diverticulitis of colon with perforation s/p colostomy 11/22/2019   Significant Hospital Events   12/22: Admitted to the ICU with acute on chronic  hypoxic/hypercapnic respiratory failure 12/23: Failed SBT due to increased work of breathing, agitation 12/24: Failed SBT due to increased work of breathing and agitation  Consults:  ID  Procedures:  12/21: Intubation  Significant Diagnostic Tests:  12/21: Chest Xray> 12/22: Noncontrast CT head>1. No acute intracranial pathology. 2. Mild bilateral ethmoid sinus and marked severity sphenoid sinus disease. 12/22: CTA Chest>No evidence of pulmonary embolism. 2. Stable, moderate to marked severity bilateral upper lobe and left lower lobe consolidative atelectatic changes. 3. Hepatic steatosis 12/23: MRI brain, limited study however no intracranial structural abnormality noted.  Sphenoid sinus disease confirmed.  Micro Data:  12/21: SARS-CoV-2 PCR> negative 12/21: Influenza PCR> negative 12/21: Blood culture x2> 12/21: Urine Culture> 12/22: MRSA PCR>>  12/22: Strep pneumo urinary antigen> 12/22: Legionella urinary antigen>  Antimicrobials:  Vancomycin 12/21 x 1 Aztreonam 12/21 x 1 Cefazolin (continued from  outpatient) OBJECTIVE  Blood pressure (!) 144/98, pulse (!) 48, temperature 98.3 F (36.8 C), temperature source Oral, resp. rate 18, height 6' 0.99" (1.854 m), weight (!) 140.7 kg, SpO2 100 %.    Vent Mode: PRVC FiO2 (%):  [40 %] 40 % Set Rate:  [18 bmp] 18 bmp Vt Set:  [530 mL] 530 mL PEEP:  [5 cmH20] 5 cmH20 Plateau Pressure:  [23 cmH20] 23 cmH20   Intake/Output Summary (Last 24 hours) at 08/12/2021 0919 Last data filed at 08/12/2021 0600 Gross per 24 hour  Intake 1614.18 ml  Output 2300 ml  Net -685.82 ml    Filed Weights   08/10/21 0300 08/11/21 0345 08/12/21 0406  Weight: (!) 141.5 kg (!) 145.7 kg (!) 140.7 kg   Physical Examination  GENERAL: 30 year-old critically ill patient lying in the bed intubated, initially noted to be agitated, tachypneic and diaphoretic.  Required resedation. EYES: Pupils equal, round, reactive to light and accommodation. No scleral icterus. Extraocular muscles appear intact.  HEENT: Head atraumatic, normocephalic. Oropharynx and nasopharynx clear.  NECK:  Supple, thick. No thyroid enlargement, trachea midline.  LUNGS: Normal breath sounds bilaterally, no wheezing, rales,rhonchi or crepitation.  Increased work of breathing with SBT . CARDIOVASCULAR: Tachycardic, S1, S2 normal. No murmurs, rubs, or gallops.  ABDOMEN: Soft, Obese, nondistended. Bowel sounds present. No organomegaly or mass. Ostomy present EXTREMITIES: Bilateral lower extremities pitting edema/2-3+ anasarca NEUROLOGIC: No overt focal deficit on limited exam  PSYCHIATRIC: The patient is intubated and sedated, agitated when sedation is off SKIN: Improving from previously described findings with nursing interventions    Labs   CBC: Recent Labs  Lab 08/09/21 2246 08/10/21 0339 08/11/21 0609 08/12/21 0618  WBC 22.6* 9.9 4.1 5.5  HGB 10.3* 8.5* 8.4* 9.2*  HCT 36.6* 29.2* 28.4* 30.9*  MCV 98.1 93.6 93.4 93.6  PLT 368 187 179 183     Basic Metabolic Panel: Recent Labs  Lab  08/09/21 2246 08/10/21 0339 08/10/21 1434 08/11/21 0609 08/12/21 0618  NA 136 137 141 142 140  K 4.3 2.5* 3.9 3.6 3.8  CL 94* 96* 100 102 100  CO2 36* 33* 33* 35* 36*  GLUCOSE 259* 106* 163* 126* 164*  BUN 6 6 <5* 6 11  CREATININE 0.63 0.37* 0.37* 0.39* 0.46*  CALCIUM 10.0 9.9 9.9 9.4 9.2  MG  --  1.3* 1.7 1.8 1.7  PHOS  --  2.4* 3.2 3.6 4.9*    GFR: Estimated Creatinine Clearance: 195.4 mL/min (A) (by C-G formula based on SCr of 0.46 mg/dL (L)). Recent Labs  Lab 08/09/21 2246 08/09/21 2247 08/09/21 2321 08/10/21 0043 08/10/21 0050 08/10/21 0339 08/11/21 0609 08/12/21 0618  PROCALCITON  --   --   --   --  0.27  --   --   --   WBC 22.6*  --   --   --   --  9.9 4.1 5.5  LATICACIDVEN  --  3.7* 2.4* 2.0*  --  2.0*  --   --      Liver Function Tests: Recent Labs  Lab 08/09/21 2246  AST 57*  ALT 8  ALKPHOS 70  BILITOT 0.9  PROT 7.8  ALBUMIN 3.1*    No results for input(s): LIPASE, AMYLASE in the last 168 hours. No results for input(s): AMMONIA in the last 168 hours.  ABG    Component Value Date/Time   PHART 7.37 08/09/2021 2344   PCO2ART  63 (H) 08/09/2021 2344   PO2ART 113 (H) 08/09/2021 2344   HCO3 36.4 (H) 08/09/2021 2344   TCO2 41 (H) 07/18/2021 0843   O2SAT 98.3 08/09/2021 2344      Coagulation Profile: Recent Labs  Lab 08/09/21 2313  INR 1.2     Cardiac Enzymes: No results for input(s): CKTOTAL, CKMB, CKMBINDEX, TROPONINI in the last 168 hours.  HbA1C: Hgb A1c MFr Bld  Date/Time Value Ref Range Status  07/08/2021 06:29 PM 6.8 (H) 4.8 - 5.6 % Final    Comment:    (NOTE) Pre diabetes:          5.7%-6.4%  Diabetes:              >6.4%  Glycemic control for   <7.0% adults with diabetes   08/10/2020 04:33 AM 5.7 (H) 4.8 - 5.6 % Final    Comment:    (NOTE) Pre diabetes:          5.7%-6.4%  Diabetes:              >6.4%  Glycemic control for   <7.0% adults with diabetes     CBG: Recent Labs  Lab 08/11/21 1603 08/11/21 1922  08/11/21 2311 08/12/21 0312 08/12/21 0741  GLUCAP 132* 165* 224* 156* 165*     Review of Systems:   UNABLE TO OBTAIN DUE TO PATIENT BEING ON THE VENT AND SEDATE  Allergies Allergies  Allergen Reactions   Tramadol Hcl     SEIZURES!!! DO NOT REORDER AT DISCHARGE!!!   Amoxicillin Rash    Tolerated cefepime and cefazolin 08/2020.     Scheduled Inpatient Meds:  arformoterol  15 mcg Nebulization BID   budesonide (PULMICORT) nebulizer solution  0.25 mg Nebulization BID   chlorhexidine gluconate (MEDLINE KIT)  15 mL Mouth Rinse BID   Chlorhexidine Gluconate Cloth  6 each Topical Daily   clonazePAM  1 mg Per Tube BID   docusate  100 mg Per Tube BID   furosemide  40 mg Intravenous BID   insulin aspart  0-20 Units Subcutaneous Q4H   mouth rinse  15 mL Mouth Rinse 10 times per day   methylPREDNISolone (SOLU-MEDROL) injection  20 mg Intravenous Q12H   pantoprazole (PROTONIX) IV  40 mg Intravenous QHS   polyethylene glycol  17 g Per Tube Daily   rivaroxaban  20 mg Per Tube Daily   Continuous Infusions:   ceFAZolin (ANCEF) IV 2 g (08/12/21 7035)   dexmedetomidine (PRECEDEX) IV infusion 1.2 mcg/kg/hr (08/12/21 0704)   fentaNYL infusion INTRAVENOUS 100 mcg/hr (08/12/21 0600)   magnesium sulfate bolus IVPB 4 g (08/12/21 0906)   norepinephrine (LEVOPHED) Adult infusion Stopped (08/11/21 0200)   valproate sodium Stopped (08/11/21 2225)   PRN Meds:.docusate, fentaNYL (SUBLIMAZE) injection, fentaNYL (SUBLIMAZE) injection, gadobutrol, LORazepam, polyethylene glycol, vecuronium   Assessment & Plan:  Acute on chronic hypoxic and hypercarbic respiratory failure  Sarcopenic obesity with obesity hypoventilation, severe deconditioning PMHx: Covid-19 pneumonia, MSSA pneumonia, Morbid obesity, probable OHS/OSA,  -continue ventilator support & lung protective strategies -Wean PEEP & FiO2 as tolerated, maintain SpO2 > 90% -Head of bed elevated 30 degrees, VAP protocol in place -Plateau pressures  less than 30 cm H20  -Intermittent chest x-ray & ABG PRN -Daily WUA with SBT per protocol -Ensure adequate pulmonary hygiene  -Bronchodilators  -PAD protocol in place; wean sedation/analgesia for RASS goal 0 -Patient may require tracheostomy again   Sepsis 2/2 MSSA bacteremia with MSSA right knee septic arthritis s/p I&D x2 Lactic acidosis, sepsis  physiology corrected Patient seen by ID on 12/20. Was to complete IV Cefazolin on 12/22 then plan to transition to Cefadroxil continue IV cefazolin while inpatient -TEE on 11/23 negative -F/u blood cultures >> NGTD  -Follow up trach aspirate, strep pneumo and Legionella antigen negative -Monitor WBC/ fever curve -Strict I/O's -Pressors for MAP goal >65 -Continue steroids per home; no indication for stress dose steroids at this time   Acute metabolic encephalopathy likely multifactorial in the setting of sepsis Baseline issues with anxiety/impulsivity -CT Head negative for acute abnormality -Limited MRI negative for acute abnormality -EEG without seizure activity -Goal RASS 0 -Supportive care -Depacon for mood stabilization -Klonopin increased to 3 times daily -As needed Ativan -Precedex, titrate off as able  Seizure Like Activity Similar presentation during last admission, Tramadol was previously discontinued due to suspected seizures, it appears that this was restarted, now DC'd. -CT Head shows no acute intracranial abnormality -MRI shows no acute abnormality though limited study -EEG showed no seizure activity -May have been myoclonic activity due to hypoxia -Discontinued Keppra -Depacon 500 mg every 12 -Sedation as above weaning as able -Seizure precautions -Valproic acid level a.m.  H/o upper extremity DVT It appears that he was started on Xarelto before around Feb/Mar 2022 for provoked upper extremity DVT.   -CTA chest neg for PE -Resumed Xarelto   Anasarca -Adjusted diuretics, Lasix 40 mg IV every 12 -Acetazolamide as  needed  Diabetes mellitus Last HgbA1c 6.8 on 07/08/21 -CBGs -Sliding scale insulin -Follow ICU hyper/hypoglycemia protocol  target goal 140 - 180 mg/dL -Hold home Meds    Best practice:  Diet: Tube feeds per dietitian Pain/Anxiety/Delirium protocol (if indicated): Yes (RASS goal 0) VAP protocol (if indicated): Yes DVT prophylaxis: Subcutaneous Heparin, discontinue when Xarelto started GI prophylaxis: PPI Glucose control:  SSI Yes Central venous access:  Yes, and it is still needed Arterial line:  N/A Foley:  Yes, and it is still needed Mobility:  bed rest  PT consulted: N/A Last date of multidisciplinary goals of care discussion [12/22] Code Status:  full code Disposition: ICU   = Goals of Care = Code Status Order: FULL   Primary Emergency Contact: [place], Home Phone: [phone] Wishes to pursue full aggressive treatment and intervention options, including CPR and intubation, but goals of care will be addressed on going with family if that should become necessary.  Critical care time: 40 minutes     The patient is critically ill with multiple organ systems failure and requires high complexity decision making for assessment and support, frequent evaluation and titration of therapies, application of advanced monitoring technologies and extensive interpretation of multiple databases.   Renold Don, MD Advanced Bronchoscopy PCCM [facility]    *This note was dictated using voice recognition software/Dragon.  Despite best efforts to proofread, errors can occur which can change the meaning. Any transcriptional errors that result from this process are unintentional and may not be fully corrected at the time of dictation.

## 2021-08-12 NOTE — Progress Notes (Signed)
Chaplain delivered Advanced Directives paperwork per wife's request. Contact on call chaplain when pt is alert for coordination of signatures of notary and witnesses.

## 2021-08-12 NOTE — Progress Notes (Addendum)
Nutrition Brief Follow-up  Propofol change   INTERVENTION:   Vital HP @ 38ml/hr- Initiate at 25ml/hr and increase by 65ml/hr q 8 hours until goal rate is reached.   ProSource TF 56ml TID via tube   Free water flushes 53ml q4 hours to maintain tube patency   Regimen provides 1800kcal/day, 180g/day protein and 1529ml/day free water   Juven Fruit Punch BID via tube, each serving provides 95kcal and 2.5g of protein (amino acids glutamine and arginine)  Pt at high refeed risk; recommend monitor potassium, magnesium and phosphorus labs daily until stable  Estimated Nutritional Needs:   Kcal:  1602-2039kcal/day  Protein:  167-209g/day  Fluid:  2.5-2.8L/day  Koleen Distance MS, RD, LDN Please refer to [facility] for RD and/or RD on-call/weekend/after hours pager

## 2021-08-12 NOTE — Consult Note (Signed)
PHARMACY CONSULT NOTE  Pharmacy Consult for Electrolyte Monitoring and Replacement   Recent Labs: Potassium (mmol/L)  Date Value  08/12/2021 3.8   Magnesium (mg/dL)  Date Value  08/12/2021 1.7   Calcium (mg/dL)  Date Value  08/12/2021 9.2   Albumin (g/dL)  Date Value  08/09/2021 3.1 (L)   Phosphorus (mg/dL)  Date Value  08/12/2021 4.9 (H)   Sodium (mmol/L)  Date Value  08/12/2021 140     Assessment: 30 year-old male with PMH of inflammatory arthritis, chronic hypoxic and hypercarpneic respiratory failure on 2L O2 PRN, MSSA bacteremia, MSSA pneumonia, perforated diverticulitis s/p partial colectomy with ostomy creation, morbid obesity and gout who was admitted to the hospital for SOB and was subsequently intubated. Pharmacy was consulted for electrolyte monitoring. Patient is currently intubated on ventilator support. OGT connected to low, intermittent suction  diuretics: furosemide 40mg  IV BID (increased 12/23)  Goal of Therapy:  Potassium 4.0 - 5.1 mmol/L Magnesium 2.0 - 2.4 mg/dL All Other Electrolytes WNL  Plan:  K 3.8  Mag 1.7  Phos 4.9  Scr 0.46 Will order 4 grams IV magnesium sulfate x 1.  (Note: received 2 grams yesterday 12/23 and level did not improve) Will order KCL packet 40 meq per tube x 2 doses Recheck electrolytes in am  continue to monitor and replace electrolytes as clinically indicated    Noralee Space ,PharmD Clinical Pharmacist 08/12/2021 10:01 AM

## 2021-08-13 LAB — CBC
HCT: 29.8 % — ABNORMAL LOW (ref 39.0–52.0)
Hemoglobin: 8.7 g/dL — ABNORMAL LOW (ref 13.0–17.0)
MCH: 27.3 pg (ref 26.0–34.0)
MCHC: 29.2 g/dL — ABNORMAL LOW (ref 30.0–36.0)
MCV: 93.4 fL (ref 80.0–100.0)
Platelets: 176 10*3/uL (ref 150–400)
RBC: 3.19 MIL/uL — ABNORMAL LOW (ref 4.22–5.81)
RDW: 17.7 % — ABNORMAL HIGH (ref 11.5–15.5)
WBC: 5.1 10*3/uL (ref 4.0–10.5)
nRBC: 0 % (ref 0.0–0.2)

## 2021-08-13 LAB — MAGNESIUM: Magnesium: 1.9 mg/dL (ref 1.7–2.4)

## 2021-08-13 LAB — BASIC METABOLIC PANEL
Anion gap: 9 (ref 5–15)
BUN: 15 mg/dL (ref 6–20)
CO2: 35 mmol/L — ABNORMAL HIGH (ref 22–32)
Calcium: 9.4 mg/dL (ref 8.9–10.3)
Chloride: 98 mmol/L (ref 98–111)
Creatinine, Ser: 0.31 mg/dL — ABNORMAL LOW (ref 0.61–1.24)
GFR, Estimated: >60 mL/min (ref >60)
Glucose, Bld: 183 mg/dL — ABNORMAL HIGH (ref 70–99)
Potassium: 2.8 mmol/L — ABNORMAL LOW (ref 3.5–5.1)
Sodium: 142 mmol/L (ref 135–145)

## 2021-08-13 LAB — GLUCOSE, CAPILLARY
Glucose-Capillary: 175 mg/dL — ABNORMAL HIGH (ref 70–99)
Glucose-Capillary: 132 mg/dL — ABNORMAL HIGH (ref 70–99)
Glucose-Capillary: 107 mg/dL — ABNORMAL HIGH (ref 70–99)
Glucose-Capillary: 115 mg/dL — ABNORMAL HIGH (ref 70–99)
Glucose-Capillary: 143 mg/dL — ABNORMAL HIGH (ref 70–99)

## 2021-08-13 LAB — PHOSPHORUS: Phosphorus: 4.6 mg/dL (ref 2.5–4.6)

## 2021-08-13 LAB — VALPROIC ACID LEVEL: Valproic Acid Lvl: 32 ug/mL — ABNORMAL LOW (ref 50.0–100.0)

## 2021-08-13 MED ORDER — POTASSIUM CHLORIDE 20 MEQ PO PACK
40.0000 meq | PACK | Freq: Once | ORAL | Status: AC
  Administered 2021-08-13: 17:00:00 40 meq
  Filled 2021-08-13: qty 2

## 2021-08-13 MED ORDER — POTASSIUM CHLORIDE 20 MEQ PO PACK
40.0000 meq | PACK | Freq: Once | ORAL | Status: AC
  Administered 2021-08-13: 06:00:00 40 meq
  Filled 2021-08-13: qty 2

## 2021-08-13 MED ORDER — VALPROIC ACID 250 MG/5ML PO SOLN
500.0000 mg | Freq: Three times a day (TID) | ORAL | Status: DC
  Administered 2021-08-13 – 2021-08-18 (×17): 500 mg
  Filled 2021-08-13 (×19): qty 10

## 2021-08-13 MED ORDER — VALPROIC ACID 250 MG/5ML PO SOLN
500.0000 mg | Freq: Two times a day (BID) | ORAL | Status: DC
  Filled 2021-08-13: qty 10

## 2021-08-13 MED ORDER — ONDANSETRON HCL 4 MG/2ML IJ SOLN
4.0000 mg | Freq: Four times a day (QID) | INTRAMUSCULAR | Status: DC | PRN
  Administered 2021-08-13 – 2021-08-18 (×4): 4 mg via INTRAVENOUS
  Filled 2021-08-13 (×4): qty 2

## 2021-08-13 MED ORDER — POTASSIUM CHLORIDE 10 MEQ/50ML IV SOLN
10.0000 meq | INTRAVENOUS | Status: AC
  Administered 2021-08-13 (×4): 10 meq via INTRAVENOUS
  Filled 2021-08-13 (×4): qty 50

## 2021-08-13 NOTE — Consult Note (Signed)
PHARMACY CONSULT NOTE  Pharmacy Consult for Electrolyte Monitoring and Replacement   Recent Labs: Potassium (mmol/L)  Date Value  08/13/2021 2.8 (L)   Magnesium (mg/dL)  Date Value  08/13/2021 1.9   Calcium (mg/dL)  Date Value  08/13/2021 9.4   Albumin (g/dL)  Date Value  08/09/2021 3.1 (L)   Phosphorus (mg/dL)  Date Value  08/13/2021 4.6   Sodium (mmol/L)  Date Value  08/13/2021 142   Assessment: 30 year-old male with PMH of inflammatory arthritis, chronic hypoxic and hypercarpneic respiratory failure on 2L O2 PRN, MSSA bacteremia, MSSA pneumonia, perforated diverticulitis s/p partial colectomy with ostomy creation, morbid obesity and gout who was admitted to the hospital for SOB and was subsequently intubated. Pharmacy was consulted for electrolyte monitoring. Patient is currently intubated on ventilator support. OGT connected to low, intermittent suction  diuretics: furosemide 40mg  IV BID (increased 12/23)  Goal of Therapy:  Potassium 4.0 - 5.1 mmol/L Magnesium 2.0 - 2.4 mg/dL All other electrolytes WNL  Plan:  K 2.8  Mag 1.9  Phos 4.6  Scr 0.31 Kcl 40 mEq per tube x 1 + IV Kcl 10 mEq x 4 per provider. Will give another 40 mEq Kcl per tube with second dose of IV Lasix today Recheck electrolytes in AM continue to monitor and replace electrolytes as clinically indicated    Benita Gutter 08/13/2021 7:34 AM

## 2021-08-13 NOTE — Progress Notes (Addendum)
NAME:  Craig [name], MRN:  [id number], DOB:  October 06, 1990, LOS: 3 ADMISSION DATE:  08/09/2021, INITIAL CONSULTATION DATE:  08/10/21 REFERRING MD: [name], MD CHIEF COMPLAINT: SOB    HPI  30 y.o with significant extensive medical history including inflammatory arthritis on methotrexate and chronic prednisone at 10 mg daily, prior severe Covid-19 infection requiring prolonged hospitalization and tracheostomy (since decannulated) Dec 2021 - Feb 2022, chronic hypoxic and hypercarpneic respiratory failure on 2L O2 PRN, MSSA bacteremia, MSSA pneumonia, perforated diverticulitis s/p partial colectomy with ostomy creation, morbid obesity and gout who presented to the ED with chief complaints of acute onset shortness of breath.  Per patient's wife at the bedside, patient became anxious and acutely short of breath while transferring form the chair to his new tempur pedic bed. EMS was called and on arrival patient was found to be hypoxic with sats in the mid 80's. Patient suffered what appeared to be a generalized tonic clonic seizures while being prepared for transport to the ED. He was placed on NRB mask and bagged during transport.  ED Course: On arrival to the ED, he was afebrile with blood pressure  mm Hg and pulse rate  beats/min. There were no focal neurological deficits; patient was somnolent, not responsive to deep sternal rub or responding to commands. EKG showed sinus tachycardia 143 bpm with a narrow QRS, normal axis, slight QTC prolongation otherwise largely normal intervals with nonspecific ST changes no ST elevation. Patient was subsequently intubated with 7.5 cuffed ETT for airway protection. Chest x-ray  post intubation showed vascular congestion.  ET tube in satisfactory position.  PICC line in satisfactory position.  Patient started on broad spectrum antibiotics. PCCM is asked to admit the patient to ICU for further work up and management of acute hypoxic hypercapnic  respiratory failure.  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 No acute intracranial pathology. 2. Mild bilateral ethmoid sinus and marked severity sphenoid sinus disease. 12/22: CTA Chest>No evidence of pulmonary embolism. 2. Stable, moderate to marked severity bilateral upper lobe and left lower lobe consolidative atelectatic changes. 3. Hepatic steatosis 12/23: MRI brain, limited study however no intracranial structural abnormality noted.  Sphenoid sinus disease confirmed.  Micro Data:  12/21: SARS-CoV-2 PCR> negative 12/21: Influenza PCR> negative 12/21: Blood culture x2> 12/21: Urine Culture> 12/22: MRSA PCR>>  12/22: Strep pneumo urinary antigen> 12/22: Legionella urinary antigen>  Antimicrobials:  Vancomycin 12/21 x 1 Aztreonam 12/21 x 1 Cefazolin (continued from  outpatient)  OBJECTIVE  Blood pressure (!) 109/58, pulse (!) 44, temperature 98.3 F (36.8 C), temperature source Oral, resp. rate 20, height 6' 0.99" (1.854 m), weight (!) 138.9 kg, SpO2 100 %.    Vent Mode: PRVC FiO2 (%):  [24 %-30 %] 24 % Set Rate:  [18 bmp] 18 bmp Vt Set:  [530 mL] 530 mL PEEP:  [5 cmH20] 5 cmH20 Plateau Pressure:  [22 cmH20] 22 cmH20   Intake/Output Summary (Last 24 hours) at 08/13/2021 1147 Last data filed at 08/13/2021 0400 Gross per 24 hour  Intake --  Output 850 ml  Net -850 ml    Filed Weights   08/11/21 0345 08/12/21 0406 08/13/21 0420  Weight: (!) 145.7 kg (!) 140.7 kg (!) 138.9 kg   Physical Examination  GENERAL: 30 year-old obese patient lying in the bed intubated, less agitated on Precedex and low-dose propofol now off of fentanyl, writing notes to communicate. EYES: Pupils equal, round, reactive to light and accommodation. No scleral icterus. Extraocular muscles appear intact.  HEENT: Head atraumatic, normocephalic. Oropharynx and nasopharynx clear.  NECK:  Supple, thick. No thyroid enlargement, trachea midline.  LUNGS: Normal breath sounds bilaterally, no wheezing, rales,rhonchi or crepitation.  Tolerating SBT today. CARDIOVASCULAR: Tachycardic, S1, S2 normal. No murmurs, rubs, or gallops.  ABDOMEN: Soft, Obese, nondistended. Bowel sounds present. No organomegaly or mass. Ostomy present, functioning. EXTREMITIES: Bilateral lower extremities pitting edema/2+ anasarca NEUROLOGIC: No overt focal deficit on limited exam  PSYCHIATRIC: The patient is intubated and sedated, follows commands, writing notes to communicate  SKIN: Improving from previously described findings with nursing interventions    Labs   CBC: Recent Labs  Lab 08/09/21 2246 08/10/21 0339 08/11/21 0609 08/12/21 0618 08/13/21 0430  WBC 22.6* 9.9 4.1 5.5 5.1  HGB 10.3* 8.5* 8.4* 9.2* 8.7*  HCT 36.6* 29.2* 28.4* 30.9* 29.8*  MCV 98.1 93.6 93.4 93.6 93.4  PLT 368 187 179 183  176     Basic Metabolic Panel: Recent Labs  Lab 08/10/21 0339 08/10/21 1434 08/11/21 0609 08/12/21 0618 08/13/21 0430  NA 137 141 142 140 142  K 2.5* 3.9 3.6 3.8 2.8*  CL 96* 100 102 100 98  CO2 33* 33* 35* 36* 35*  GLUCOSE 106* 163* 126* 164* 183*  BUN 6 <5* '6 11 15  ' CREATININE 0.37* 0.37* 0.39* 0.46* 0.31*  CALCIUM 9.9 9.9 9.4 9.2 9.4  MG 1.3* 1.7 1.8 1.7 1.9  PHOS 2.4* 3.2 3.6 4.9* 4.6    GFR: Estimated Creatinine Clearance: 194.1 mL/min (A) (by C-G formula based on SCr of 0.31 mg/dL (L)). Recent Labs  Lab 08/09/21 2247 08/09/21 2321 08/10/21 0043 08/10/21 0050 08/10/21 0339 08/11/21 0609 08/12/21 0618 08/13/21 0430  PROCALCITON  --   --   --  0.27  --   --   --   --   WBC  --   --   --   --  9.9 4.1 5.5 5.1  LATICACIDVEN 3.7* 2.4* 2.0*  --  2.0*  --   --   --      Liver Function Tests: Recent Labs  Lab 08/09/21 2246  AST 57*  ALT 8  ALKPHOS 70  BILITOT 0.9  PROT 7.8  ALBUMIN 3.1*    No results for input(s): LIPASE, AMYLASE in the last 168 hours. No results for input(s): AMMONIA in the last 168 hours.  ABG    Component Value Date/Time   PHART  7.37 08/09/2021 2344   PCO2ART 63 (H) 08/09/2021 2344   PO2ART 113 (H) 08/09/2021 2344   HCO3 36.4 (H) 08/09/2021 2344   TCO2 41 (H) 07/18/2021 0843   O2SAT 98.3 08/09/2021 2344      Coagulation Profile: Recent Labs  Lab 08/09/21 2313  INR 1.2     Cardiac Enzymes: No results for input(s): CKTOTAL, CKMB, CKMBINDEX, TROPONINI in the last 168 hours.  HbA1C: Hgb A1c MFr Bld  Date/Time Value Ref Range Status  07/08/2021 06:29 PM 6.8 (H) 4.8 - 5.6 % Final    Comment:    (NOTE) Pre diabetes:          5.7%-6.4%  Diabetes:              >6.4%  Glycemic control for   <7.0% adults with diabetes   08/10/2020 04:33 AM 5.7 (H) 4.8 - 5.6 % Final    Comment:    (NOTE) Pre diabetes:          5.7%-6.4%  Diabetes:              >6.4%  Glycemic control for   <7.0% adults with diabetes      CBG: Recent Labs  Lab 08/12/21 1554 08/12/21 1926 08/12/21 2311 08/13/21 0405 08/13/21 0739  GLUCAP 156* 146* 160* 175* 132*     Review of Systems:   Does not communicate any major complaint.  Endorses discomfort with the ET tube.  Feels anxious.  Allergies Allergies  Allergen Reactions   Tramadol Hcl     SEIZURES!!! DO NOT REORDER AT DISCHARGE!!!   Amoxicillin Rash    Tolerated cefepime and cefazolin 08/2020.     Scheduled Inpatient Meds:  arformoterol  15 mcg Nebulization BID   budesonide (PULMICORT) nebulizer solution  0.25 mg Nebulization BID   chlorhexidine gluconate (MEDLINE KIT)  15 mL Mouth Rinse BID   Chlorhexidine Gluconate Cloth  6 each Topical Daily   clonazePAM  1 mg Per Tube BID   docusate  100 mg Per Tube BID   feeding supplement (PROSource TF)  45 mL Per Tube TID   free water  30 mL Per Tube Q4H   furosemide  40 mg Intravenous BID   insulin aspart  0-20 Units Subcutaneous Q4H   mouth rinse  15 mL Mouth Rinse 10 times per day   nutrition supplement (JUVEN)  1 packet Per Tube BID BM   pantoprazole (PROTONIX) IV  40 mg Intravenous QHS   polyethylene glycol  17 g Per Tube Daily   potassium chloride  40 mEq Per Tube Once   rivaroxaban  20 mg Per Tube Daily   Continuous Infusions:   ceFAZolin (ANCEF) IV 2 g (08/13/21 0541)   dexmedetomidine (PRECEDEX) IV infusion 1 mcg/kg/hr (08/13/21 1108)   feeding supplement (VITAL HIGH PROTEIN) 50 mL/hr at 08/12/21 2100   propofol (DIPRIVAN) infusion 15 mcg/kg/min (08/13/21 0605)   valproate sodium 500 mg (08/13/21 0941)   PRN Meds:.docusate, gadobutrol, LORazepam, ondansetron (ZOFRAN) IV, polyethylene glycol, vecuronium   Assessment & Plan:  Acute on chronic hypoxic and hypercarbic respiratory failure  Sarcopenic obesity with obesity hypoventilation, severe deconditioning PMHx: Covid-19 pneumonia, MSSA pneumonia, Morbid obesity, probable OHS/OSA,  -continue ventilator support & lung protective  strategies -Wean PEEP & FiO2 as tolerated, maintain SpO2 > 90% -Head of bed elevated 30 degrees, VAP protocol in place -Intermittent chest x-ray & ABG PRN -Daily WUA with SBT per protocol -Ensure adequate pulmonary hygiene  -Bronchodilators  -PAD protocol in place; wean sedation/analgesia  for RASS goal 0 -Patient may require tracheostomy to wean   Sepsis 2/2 MSSA bacteremia with MSSA right knee septic arthritis s/p I&D x2 Lactic acidosis, sepsis physiology corrected Patient seen by ID on 12/20. Was to complete IV Cefazolin on 12/22 then plan to transition to Cefadroxil continue IV cefazolin while inpatient -TEE on 11/23 negative -F/u blood cultures >> NGTD  -Follow up trach aspirate, strep pneumo and Legionella antigen negative -Monitor WBC/ fever curve -Strict I/O's -Pressors for MAP goal >65 -Continue steroids per home; no indication for stress dose steroids at this time   Acute metabolic encephalopathy likely multifactorial in the setting of sepsis Baseline issues with anxiety/impulsivity -CT Head negative for acute abnormality -Limited MRI negative for acute abnormality -EEG without seizure activity -Goal RASS 0 -Supportive care -Depakote for mood stabilization, adjusted dose today -Klonopin 1 mg 3 times daily -As needed Ativan -Precedex, titrate off as able -Avoid fentanyl due to paradoxic agitation  Seizure Like Activity Similar presentation during last admission, Tramadol was previously discontinued due to suspected seizures, it appears that this was restarted as outpatient, now DC'd. -CT Head shows no acute intracranial abnormality -MRI shows no acute abnormality though limited study -EEG showed no seizure activity -May have been myoclonic activity due to hypoxia -Discontinued Keppra -Depacon switched to Depakene 500 mg every 8 adjusting for levels -Sedation as above weaning as able -Seizure precautions -Valproic acid level was low, dose adjustment made  H/o  upper extremity DVT It appears that he was started on Xarelto before around Feb/Mar 2022 for provoked upper extremity DVT.   -CTA chest neg for PE -Resumed Xarelto   Anasarca -Continue Lasix 40 mg IV every 12 -Acetazolamide as needed -Anasarca improving  Diabetes mellitus Last HgbA1c 6.8 on 07/08/21 -CBGs -Sliding scale insulin -Follow ICU hyper/hypoglycemia protocol  target goal 140 - 180 mg/dL -Hold home Meds    Best practice:  Diet: Tube feeds per dietitian Pain/Anxiety/Delirium protocol (if indicated): Yes (RASS goal 0) VAP protocol (if indicated): Yes DVT prophylaxis: Systemic AC, Xarelto  GI prophylaxis: PPI Glucose control:  SSI Yes Central venous access:  Yes, and it is still needed Arterial line:  N/A Foley:  Yes, and it is still needed Mobility:  bed rest  PT consulted: N/A Last date of multidisciplinary goals of care discussion [12/22] Code Status:  full code Disposition: ICU   = Goals of Care = Code Status Order: FULL   Primary Emergency Contact: [place], Home Phone: [phone] Wishes to pursue full aggressive treatment and intervention options, including CPR and intubation, but goals of care will be addressed on going with family if that should become necessary.  Critical care time: 40 minutes     The patient is critically ill with multiple organ systems failure and requires high complexity decision making for assessment and support, frequent evaluation and titration of therapies, application of advanced monitoring technologies and extensive interpretation of multiple databases.   Renold Don, MD Advanced Bronchoscopy PCCM [facility]    *This note was dictated using voice recognition software/Dragon.  Despite best efforts to proofread, errors can occur which can change the meaning. Any transcriptional errors that result from this process are unintentional and may not be fully corrected at the time of dictation.

## 2021-08-14 LAB — BASIC METABOLIC PANEL
Anion gap: 9 (ref 5–15)
Anion gap: 10 (ref 5–15)
BUN: 17 mg/dL (ref 6–20)
BUN: 13 mg/dL (ref 6–20)
CO2: 36 mmol/L — ABNORMAL HIGH (ref 22–32)
CO2: 38 mmol/L — ABNORMAL HIGH (ref 22–32)
Calcium: 9.3 mg/dL (ref 8.9–10.3)
Calcium: 9.6 mg/dL (ref 8.9–10.3)
Chloride: 96 mmol/L — ABNORMAL LOW (ref 98–111)
Chloride: 95 mmol/L — ABNORMAL LOW (ref 98–111)
Creatinine, Ser: 0.43 mg/dL — ABNORMAL LOW (ref 0.61–1.24)
Creatinine, Ser: 0.41 mg/dL — ABNORMAL LOW (ref 0.61–1.24)
GFR, Estimated: >60 mL/min (ref >60)
GFR, Estimated: >60 mL/min (ref >60)
Glucose, Bld: 153 mg/dL — ABNORMAL HIGH (ref 70–99)
Glucose, Bld: 102 mg/dL — ABNORMAL HIGH (ref 70–99)
Potassium: 3 mmol/L — ABNORMAL LOW (ref 3.5–5.1)
Potassium: 3.6 mmol/L (ref 3.5–5.1)
Sodium: 141 mmol/L (ref 135–145)
Sodium: 143 mmol/L (ref 135–145)

## 2021-08-14 LAB — GLUCOSE, CAPILLARY
Glucose-Capillary: 107 mg/dL — ABNORMAL HIGH (ref 70–99)
Glucose-Capillary: 129 mg/dL — ABNORMAL HIGH (ref 70–99)
Glucose-Capillary: 139 mg/dL — ABNORMAL HIGH (ref 70–99)
Glucose-Capillary: 150 mg/dL — ABNORMAL HIGH (ref 70–99)
Glucose-Capillary: 51 mg/dL — ABNORMAL LOW (ref 70–99)
Glucose-Capillary: 66 mg/dL — ABNORMAL LOW (ref 70–99)
Glucose-Capillary: 70 mg/dL (ref 70–99)
Glucose-Capillary: 86 mg/dL (ref 70–99)
Glucose-Capillary: 89 mg/dL (ref 70–99)
Glucose-Capillary: 95 mg/dL (ref 70–99)

## 2021-08-14 LAB — CULTURE, BLOOD (ROUTINE X 2)
Culture: NO GROWTH
Culture: NO GROWTH
Special Requests: ADEQUATE

## 2021-08-14 LAB — CBC
HCT: 31 % — ABNORMAL LOW (ref 39.0–52.0)
Hemoglobin: 8.9 g/dL — ABNORMAL LOW (ref 13.0–17.0)
MCH: 27.4 pg (ref 26.0–34.0)
MCHC: 28.7 g/dL — ABNORMAL LOW (ref 30.0–36.0)
MCV: 95.4 fL (ref 80.0–100.0)
Platelets: 200 10*3/uL (ref 150–400)
RBC: 3.25 MIL/uL — ABNORMAL LOW (ref 4.22–5.81)
RDW: 17.7 % — ABNORMAL HIGH (ref 11.5–15.5)
WBC: 6.1 10*3/uL (ref 4.0–10.5)
nRBC: 0 % (ref 0.0–0.2)

## 2021-08-14 LAB — PHOSPHORUS: Phosphorus: 4.8 mg/dL — ABNORMAL HIGH (ref 2.5–4.6)

## 2021-08-14 LAB — MAGNESIUM
Magnesium: 1.4 mg/dL — ABNORMAL LOW (ref 1.7–2.4)
Magnesium: 2.1 mg/dL (ref 1.7–2.4)

## 2021-08-14 LAB — TRIGLYCERIDES: Triglycerides: 268 mg/dL — ABNORMAL HIGH (ref <150)

## 2021-08-14 MED ORDER — POTASSIUM CHLORIDE 10 MEQ/50ML IV SOLN
10.0000 meq | INTRAVENOUS | Status: AC
  Administered 2021-08-14 (×4): 10 meq via INTRAVENOUS
  Filled 2021-08-14 (×4): qty 50

## 2021-08-14 MED ORDER — OXYCODONE HCL 5 MG PO TABS
5.0000 mg | ORAL_TABLET | Freq: Three times a day (TID) | ORAL | Status: DC | PRN
Start: 2021-08-14 — End: 2021-08-18
  Administered 2021-08-14: 23:00:00 10 mg
  Administered 2021-08-14: 15:00:00 5 mg
  Administered 2021-08-14 – 2021-08-16 (×5): 10 mg
  Administered 2021-08-17 – 2021-08-18 (×4): 5 mg
  Filled 2021-08-14 (×2): qty 1
  Filled 2021-08-14 (×2): qty 2
  Filled 2021-08-14: qty 1
  Filled 2021-08-14: qty 2
  Filled 2021-08-14 (×3): qty 1
  Filled 2021-08-14 (×3): qty 2

## 2021-08-14 MED ORDER — LIDOCAINE 5 % EX PTCH
1.0000 | MEDICATED_PATCH | CUTANEOUS | Status: DC
  Administered 2021-08-14 – 2021-08-23 (×10): 1 via TRANSDERMAL
  Filled 2021-08-14 (×11): qty 1

## 2021-08-14 MED ORDER — MAGNESIUM SULFATE 4 GM/100ML IV SOLN
4.0000 g | Freq: Once | INTRAVENOUS | Status: AC
  Administered 2021-08-14: 06:00:00 4 g via INTRAVENOUS
  Filled 2021-08-14: qty 100

## 2021-08-14 MED ORDER — QUETIAPINE FUMARATE 25 MG PO TABS
25.0000 mg | ORAL_TABLET | Freq: Every day | ORAL | Status: DC
  Administered 2021-08-14 – 2021-08-19 (×6): 25 mg via ORAL
  Filled 2021-08-14 (×7): qty 1

## 2021-08-14 MED ORDER — POTASSIUM CHLORIDE 10 MEQ/50ML IV SOLN
10.0000 meq | INTRAVENOUS | Status: AC
  Administered 2021-08-14 (×3): 10 meq via INTRAVENOUS
  Filled 2021-08-14 (×3): qty 50

## 2021-08-14 MED ORDER — POTASSIUM CHLORIDE 20 MEQ PO PACK
40.0000 meq | PACK | Freq: Once | ORAL | Status: AC
  Administered 2021-08-14: 06:00:00 40 meq

## 2021-08-14 NOTE — Progress Notes (Signed)
NAME:  Craig [name], MRN:  [id number], DOB:  1990/11/20, LOS: 4 ADMISSION DATE:  08/09/2021, INITIAL CONSULTATION DATE:  08/10/21 REFERRING MD: [name], MD CHIEF COMPLAINT: SOB    HPI  30 y.o with significant extensive medical history including inflammatory arthritis on methotrexate and chronic prednisone at 10 mg daily, prior severe Covid-19 infection requiring prolonged hospitalization and tracheostomy (since decannulated) Dec 2021 - Feb 2022, chronic hypoxic and hypercarpneic respiratory failure on 2L O2 PRN, MSSA bacteremia, MSSA pneumonia, perforated diverticulitis s/p partial colectomy with ostomy creation, morbid obesity and gout who presented to the ED with chief complaints of acute onset shortness of breath.  Per patient's wife at the bedside, patient became anxious and acutely short of breath while transferring form the chair to his new tempur pedic bed. EMS was called and on arrival patient was found to be hypoxic with sats in the mid 80's. Patient suffered what appeared to be a generalized tonic clonic seizures while being prepared for transport to the ED. He was placed on NRB mask and bagged during transport.  ED Course: On arrival to the ED, he was afebrile with blood pressure  mm Hg and pulse rate  beats/min. There were no focal neurological deficits; patient was somnolent, not responsive to deep sternal rub or responding to commands. EKG showed sinus tachycardia 143 bpm with a narrow QRS, normal axis, slight QTC prolongation otherwise largely normal intervals with nonspecific ST changes no ST elevation. Patient was subsequently intubated with 7.5 cuffed ETT for airway protection. Chest x-ray  post intubation showed vascular congestion.  ET tube in satisfactory position.  PICC line in satisfactory position.  Patient started on broad spectrum antibiotics. PCCM is asked to admit the patient to ICU for further work up and management of acute hypoxic hypercapnic  respiratory failure.  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) 02/17/2020   Diverticulitis of colon with perforation s/p colostomy 11/22/2019   Significant Hospital Events   12/22: Admitted to the ICU with acute on chronic  hypoxic/hypercapnic respiratory failure 12/23: Failed SBT due to increased work of breathing, agitation 12/24: Failed SBT due to increased work of breathing and agitation  Consults:  ID  Procedures:  12/21: Intubation  Significant Diagnostic Tests:  12/21: Chest Xray> 12/22: Noncontrast CT head>1. No acute intracranial pathology. 2. Mild bilateral ethmoid sinus and marked severity sphenoid sinus disease. 12/22: CTA Chest>No evidence of pulmonary embolism. 2. Stable, moderate to marked severity bilateral upper lobe and left lower lobe consolidative atelectatic changes. 3. Hepatic steatosis 12/23: MRI brain, limited study however no intracranial structural abnormality noted.  Sphenoid sinus disease confirmed.  Micro Data:  12/21: SARS-CoV-2 PCR> negative 12/21: Influenza PCR> negative 12/21: Blood culture x2>negative  12/21: Urine Culture>negative 12/22: MRSA PCR>> neg 12/22: Strep pneumo urinary antigen>negative 12/22: Legionella urinary antigen> not done  Antimicrobials:  Vancomycin 12/21 x 1 Aztreonam  12/21 x 1 Cefazolin (continued from outpatient)  OBJECTIVE  Blood pressure 121/75, pulse 66, temperature 98 F (36.7 C), temperature source Oral, resp. rate 18, height 6' 0.99" (1.854 m), weight (!) 136.9 kg, SpO2 100 %.    Vent Mode: Spontaneous FiO2 (%):  [24 %-30 %] 30 % Set Rate:  [18 bmp] 18 bmp Vt Set:  [530 mL] 530 mL PEEP:  [5 cmH20] 5 cmH20 Pressure Support:  [12 cmH20] 12 cmH20   Intake/Output Summary (Last 24 hours) at 08/14/2021 0837 Last data filed at 08/14/2021 0606 Gross per 24 hour  Intake 5388.11 ml  Output 3825 ml  Net 1563.11 ml    Filed Weights   08/12/21 0406 08/13/21 0420 08/14/21 0500  Weight: (!) 140.7 kg (!) 138.9 kg (!) 136.9 kg   Physical Examination  GENERAL: 30 year-old obese patient lying in the bed intubated, off of propofol.  On Precedex. EYES: Pupils equal, round, reactive to light and accommodation. No scleral icterus.  HEENT: Head atraumatic, normocephalic.  Orotracheally intubated. NECK:  Supple, thick. No thyroid enlargement, trachea midline.  LUNGS: Normal breath sounds bilaterally, no wheezing, rales,rhonchi or crepitation.  Tolerating SBT today.  Good air leak on cuff deflation. CARDIOVASCULAR: Tachycardic, S1, S2 normal. No murmurs, rubs, or gallops.  ABDOMEN: Soft, Obese, nondistended. Bowel sounds present. No organomegaly or mass. Ostomy present, functioning. EXTREMITIES: Bilateral lower extremities pitting edema/1-2+ anasarca NEUROLOGIC: No overt focal deficit on limited exam  PSYCHIATRIC: The patient is intubated, follows commands, writing notes to communicate  SKIN: Improving from previously described findings with nursing interventions    Labs   CBC: Recent Labs  Lab 08/10/21 0339 08/11/21 0609 08/12/21 0618 08/13/21 0430 08/14/21 0345  WBC 9.9 4.1 5.5 5.1 6.1  HGB 8.5* 8.4* 9.2* 8.7* 8.9*  HCT 29.2* 28.4* 30.9* 29.8* 31.0*  MCV 93.6 93.4 93.6 93.4 95.4  PLT 187 179 183 176 200     Basic Metabolic Panel: Recent  Labs  Lab 08/10/21 1434 08/11/21 0609 08/12/21 0618 08/13/21 0430 08/14/21 0345  NA 141 142 140 142 141  K 3.9 3.6 3.8 2.8* 3.0*  CL 100 102 100 98 96*  CO2 33* 35* 36* 35* 36*  GLUCOSE 163* 126* 164* 183* 153*  BUN <5* '6 11 15 17  ' CREATININE 0.37* 0.39* 0.46* 0.31* 0.43*  CALCIUM 9.9 9.4 9.2 9.4 9.3  MG 1.7 1.8 1.7 1.9 1.4*  PHOS 3.2 3.6 4.9* 4.6 4.8*    GFR: Estimated Creatinine Clearance: 192.6 mL/min (A) (by C-G formula based on SCr of 0.43 mg/dL (L)). Recent Labs  Lab 08/09/21 2247 08/09/21 2321 08/10/21 0043 08/10/21 0050 08/10/21 0339 08/11/21 0609 08/12/21 0618 08/13/21 0430 08/14/21 0345  PROCALCITON  --   --   --  0.27  --   --   --   --   --   WBC  --   --   --   --  9.9 4.1 5.5 5.1 6.1  LATICACIDVEN 3.7* 2.4* 2.0*  --  2.0*  --   --   --   --      Liver Function Tests: Recent Labs  Lab 08/09/21 2246  AST 57*  ALT 8  ALKPHOS 70  BILITOT 0.9  PROT 7.8  ALBUMIN 3.1*    No results for input(s): LIPASE, AMYLASE in the last 168 hours. No results for input(s): AMMONIA in the last 168 hours.  ABG    Component Value Date/Time   PHART  7.37 08/09/2021 2344   PCO2ART 63 (H) 08/09/2021 2344   PO2ART 113 (H) 08/09/2021 2344   HCO3 36.4 (H) 08/09/2021 2344   TCO2 41 (H) 07/18/2021 0843   O2SAT 98.3 08/09/2021 2344      Coagulation Profile: Recent Labs  Lab 08/09/21 2313  INR 1.2     Cardiac Enzymes: No results for input(s): CKTOTAL, CKMB, CKMBINDEX, TROPONINI in the last 168 hours.  HbA1C: Hgb A1c MFr Bld  Date/Time Value Ref Range Status  07/08/2021 06:29 PM 6.8 (H) 4.8 - 5.6 % Final    Comment:    (NOTE) Pre diabetes:          5.7%-6.4%  Diabetes:              >6.4%  Glycemic control for   <7.0% adults with diabetes   08/10/2020 04:33 AM 5.7 (H) 4.8 - 5.6 % Final    Comment:    (NOTE) Pre diabetes:          5.7%-6.4%  Diabetes:              >6.4%  Glycemic control for   <7.0% adults with diabetes     CBG: Recent Labs   Lab 08/13/21 1658 08/13/21 1930 08/14/21 0000 08/14/21 0343 08/14/21 0803  GLUCAP 115* 143* 139* 107* 129*     Review of Systems:   Does not communicate any major complaint.  Endorses discomfort with the ET tube.  Feels anxious.  Allergies Allergies  Allergen Reactions   Tramadol Hcl     SEIZURES!!! DO NOT REORDER AT DISCHARGE!!!   Fentanyl Citrate Other (See Comments)    Makes patient more agitated & delirious   Amoxicillin Rash    Tolerated cefepime and cefazolin 08/2020.     Scheduled Inpatient Meds:  arformoterol  15 mcg Nebulization BID   budesonide (PULMICORT) nebulizer solution  0.25 mg Nebulization BID   chlorhexidine gluconate (MEDLINE KIT)  15 mL Mouth Rinse BID   Chlorhexidine Gluconate Cloth  6 each Topical Daily   clonazePAM  1 mg Per Tube BID   docusate  100 mg Per Tube BID   feeding supplement (PROSource TF)  45 mL Per Tube TID   free water  30 mL Per Tube Q4H   furosemide  40 mg Intravenous BID   insulin aspart  0-20 Units Subcutaneous Q4H   lidocaine  1 patch Transdermal Q24H   mouth rinse  15 mL Mouth Rinse 10 times per day   nutrition supplement (JUVEN)  1 packet Per Tube BID BM   pantoprazole (PROTONIX) IV  40 mg Intravenous QHS   polyethylene glycol  17 g Per Tube Daily   rivaroxaban  20 mg Per Tube Daily   valproic acid  500 mg Per Tube TID   Continuous Infusions:   ceFAZolin (ANCEF) IV Stopped (08/14/21 1914)   dexmedetomidine (PRECEDEX) IV infusion 0.8 mcg/kg/hr (08/14/21 0716)   feeding supplement (VITAL HIGH PROTEIN) 1,000 mL (08/13/21 1707)   potassium chloride 10 mEq (08/14/21 0757)   propofol (DIPRIVAN) infusion 25 mcg/kg/min (08/14/21 0637)   PRN Meds:.docusate, gadobutrol, LORazepam, ondansetron (ZOFRAN) IV, oxyCODONE, polyethylene glycol, vecuronium   Assessment & Plan:  Acute on chronic hypoxic and hypercarbic respiratory failure  Sarcopenic obesity with obesity hypoventilation, severe deconditioning PMHx: Covid-19 pneumonia,  MSSA pneumonia, Morbid obesity, probable OHS/OSA,  -Tolerating SBT, will proceed with extubation -Extubate to BiPAP -Head of bed elevated 30 degrees, while on BiPAP -Intermittent chest x-ray & ABG PRN -Ensure adequate pulmonary hygiene  -  Bronchodilators    Sepsis 2/2 MSSA bacteremia with MSSA right knee septic arthritis s/p I&D x2 Lactic acidosis, sepsis physiology corrected Patient seen by ID on 12/20. Was to complete IV Cefazolin on 12/22 then plan to transition to Cefadroxil continue IV cefazolin while inpatient -TEE on 11/23 negative -F/u blood cultures >> NGTD  -Monitor WBC/ fever curve -Strict I/O's -Continue steroids per home; no indication for stress dose steroids at this time   Acute metabolic encephalopathy likely multifactorial in the setting of sepsis Baseline issues with anxiety/impulsivity -CT Head negative for acute abnormality -Limited MRI negative for acute abnormality -EEG without seizure activity -Supportive care -Depakote for mood stabilization, 500 milligrams p.o. 3 times daily -Klonopin 1 mg 3 times daily -As needed Ativan -Precedex, titrate off today -Avoid fentanyl due to paradoxic agitation  Seizure Like Activity Similar presentation during last admission, Tramadol was previously discontinued due to suspected seizures, it appears that this was restarted as outpatient, now DC'd. -CT Head shows no acute intracranial abnormality -MRI shows no acute abnormality though limited study -EEG showed no seizure activity -May have been myoclonic activity due to hypoxia -Discontinued Keppra -Depacon switched to Depakene 500 mg every 8 adjusting for levels -Sedation as above weaning as able -Seizure precautions -Valproic acid level was low, dose adjustment made  H/o upper extremity DVT It appears that he was started on Xarelto before around Feb/Mar 2022 for provoked upper extremity DVT.   -CTA chest neg for PE -Resumed Xarelto   Anasarca -Continue Lasix 40 mg  IV every 12 -Acetazolamide as needed -Anasarca improving  Diabetes mellitus Last HgbA1c 6.8 on 07/08/21 -CBGs -Sliding scale insulin -Follow ICU hyper/hypoglycemia protocol  target goal 140 - 180 mg/dL -Hold home Meds  Electrolyte derangements Hypomagnesemia, hypokalemia Supplementing per pharmacy    Best practice:  Diet: Tube feeds per dietitian Pain/Anxiety/Delirium protocol (if indicated): Yes (RASS goal 0) VAP protocol (if indicated): Yes DVT prophylaxis: Systemic AC, Xarelto  GI prophylaxis: PPI Glucose control:  SSI Yes Central venous access:  Yes, and it is still needed Arterial line:  N/A Foley:  Yes, and it is still needed Mobility:  bed rest  PT consulted: N/A Last date of multidisciplinary goals of care discussion [12/22] Code Status:  full code Disposition: ICU   = Goals of Care = Code Status Order: FULL   Primary Emergency Contact: Barrett, Home Phone: [phone] Wishes to pursue full aggressive treatment and intervention options, including CPR and intubation, but goals of care will be addressed on going with family if that should become necessary.  Critical care time: 40 minutes    Multidisciplinary rounds were performed with ICU team.  Wife updated at bedside.   The patient is critically ill with multiple organ systems failure and requires high complexity decision making for assessment and support, frequent evaluation and titration of therapies, application of advanced monitoring technologies and extensive interpretation of multiple databases.   Renold Don, MD Advanced Bronchoscopy PCCM [facility]    *This note was dictated using voice recognition software/Dragon.  Despite best efforts to proofread, errors can occur which can change the meaning. Any transcriptional errors that result from this process are unintentional and may not be fully corrected at the time of dictation.

## 2021-08-14 NOTE — Progress Notes (Addendum)
[facility] hospitalist acceptance note  30 year old male with extensive past medical history including inflammatory arthritis on methotrexate and chronic steroids, history of severe COVID-19 infection requiring prolonged hospitalization and tracheostomy, since decannulated in December 20 10 October 2020, chronic hypoxic and hypercapnic respiratory failure on 2 L baseline nasal cannula, history of MSSA bacteremia and pneumonia, perforated diverticulitis status post partial colectomy and ostomy, morbid obesity, gout who presented to the ED with chief complaint of acute shortness of breath.  Patient intubated on admission due to severe hypoxic and hypercarbic respiratory failure in the setting of obesity, OHS, severe deconditioning.  Treated for sepsis secondary to MSSA bacteremia associated with right knee septic arthritis status post I&D x2.  Seen by ID.  TEE negative.  Patient successfully extubated on 12/26.  He requires nightly BiPAP.  Outpatient pulmonologist Dr. Lanney Gins.  Remains markedly deconditioned but stabilized from ICU standpoint.  TRH to assume primary care of this patient on 12/27 Signout received via secure chat from PCCM attending  Ralene Muskrat MD  No charge note

## 2021-08-14 NOTE — Consult Note (Addendum)
PHARMACY CONSULT NOTE  Pharmacy Consult for Electrolyte Monitoring and Replacement   Recent Labs: Potassium (mmol/L)  Date Value  08/14/2021 3.0 (L)   Magnesium (mg/dL)  Date Value  08/14/2021 1.4 (L)   Calcium (mg/dL)  Date Value  08/14/2021 9.3   Albumin (g/dL)  Date Value  08/09/2021 3.1 (L)   Phosphorus (mg/dL)  Date Value  08/14/2021 4.8 (H)   Sodium (mmol/L)  Date Value  08/14/2021 141   Assessment: 30 year-old male with PMH of inflammatory arthritis, chronic hypoxic and hypercarpneic respiratory failure on 2L O2 PRN, MSSA bacteremia, MSSA pneumonia, perforated diverticulitis s/p partial colectomy with ostomy creation, morbid obesity and gout who was admitted to the hospital for SOB and was subsequently intubated. Pharmacy was consulted for electrolyte monitoring. Patient is currently extubated as of this morning.   diuretics: furosemide 40mg  IV BID Vital high protein tube feeds @70mL /hr Free water 85mL q4h  Labs Na 142>141 K 2.8>3.0 Phos 4.6>4.8 Mg 1.9>1.4  Goal of Therapy:  Potassium 4.0 - 5.1 mmol/L Magnesium 2.0 - 2.4 mg/dL All other electrolytes WNL  Plan:  --MD placed orders for 4g Mg sulfate and 12mEq Kcl IV q1h x4 --Orders placed for recheck BMP and Mg placed for 12/26 1200 to assess if additional replacement is indicated  --Will continue to monitor and replace electrolytes as clinically indicated   Vallery Sa, PharmD, [place], PharmD Pharmacy Resident  08/14/2021 11:36 AM

## 2021-08-14 NOTE — Progress Notes (Signed)
PT Cancellation Note  Patient Details Name: NICOLIS BOODY MRN: [id number] DOB: [birth year]   Cancelled Treatment:    Reason Eval/Treat Not Completed: Other (comment).  PT chart reviewed and attempted to see pt.  Pt attempted to perform bed level exercises, however due to fever and feeling cold, requested that therapist come back later.  Pt will be seen at later date/time as medically appropriate.     Gwenlyn Saran, PT, DPT 08/14/21, 3:17 PM

## 2021-08-14 NOTE — Progress Notes (Signed)
Patients hands cold. Blood taken from a-line to get an accurate blood sugar. Dr. Patsey Berthold notified.

## 2021-08-14 NOTE — Progress Notes (Signed)
Pt extubated without complications, placed on bipap, tolerating well

## 2021-08-14 NOTE — Progress Notes (Signed)
Nutrition Follow-up  DOCUMENTATION CODES:   Morbid obesity  INTERVENTION:   RD will add supplements once pt's diet is advanced.   Recommend MVI po daily   Recommend Vitamin C 569m po BID   NUTRITION DIAGNOSIS:   Inadequate oral intake related to inability to eat as evidenced by NPO status.  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  Pt extubated today. Pt currently on bipap. Plan is for SLP evaluation once pt is off bipap. Pt requesting food today. RD will add supplements and vitamins once pt's diet is advanced. Per chart, pt is down 10lbs since admit. Pt +2.5L on his I & Os.   Medications reviewed and include: colace, lasix, insulin, protonix, miralax, cefazolin  Labs reviewed: K 3.6 wnl, creat 0.41(L), P 4.8(H), Mg 1.8 wnl Hgb 8.9(L), Hct 31.0(L) Cbgs- 86, 129, 107, 139 x 24 hrs  Diet Order:   Diet Order             Diet NPO time specified  Diet effective now                  EDUCATION NEEDS:   Not appropriate for education at this time  Skin:  Skin Integrity Issues:: Stage II, Incisions Stage II: coccyx Incisions: closed rt knee  Last BM:  12/26- type 7  Height:   Ht Readings from Last  1 Encounters:  08/14/21 6' 0.99" (1.854 m)    Weight:   Wt Readings from Last 1 Encounters:  08/14/21 (!) 136.9 kg    Ideal Body Weight:  83.6 kg  BMI:  Body mass index is 39.83 kg/m.  Estimated Nutritional Needs:   Kcal:  2700-3000kcal/day  Protein:  >135g/day  Fluid:  2.5-2.8L/day  CKoleen DistanceMS, RD, LDN Please refer to [facility] Centerfor RD and/or RD on-call/weekend/after hours pager

## 2021-08-14 NOTE — Consult Note (Signed)
PHARMACY CONSULT NOTE  Pharmacy Consult for Electrolyte Monitoring and Replacement   Recent Labs: Potassium (mmol/L)  Date Value  08/14/2021 3.6   Magnesium (mg/dL)  Date Value  08/14/2021 2.1   Calcium (mg/dL)  Date Value  08/14/2021 9.6   Albumin (g/dL)  Date Value  08/09/2021 3.1 (L)   Phosphorus (mg/dL)  Date Value  08/14/2021 4.8 (H)   Sodium (mmol/L)  Date Value  08/14/2021 143   Assessment: 30 year-old male with PMH of inflammatory arthritis, chronic hypoxic and hypercarpneic respiratory failure on 2L O2 PRN, MSSA bacteremia, MSSA pneumonia, perforated diverticulitis s/p partial colectomy with ostomy creation, morbid obesity and gout who was admitted to the hospital for SOB and was subsequently intubated. Pharmacy was consulted for electrolyte monitoring. Patient is currently extubated as of this morning.   diuretics: furosemide 40mg  IV BID Vital high protein tube feeds @70mL /hr Free water 92mL q4h  Goal of Therapy:  Potassium 4.0 - 5.1 mmol/L Magnesium 2.0 - 2.4 mg/dL All other electrolytes WNL  Plan:  35mEq KCl IV q1h x 3 Recheck electrolytes in am 12/27  Vallery Sa, PharmD, BCPS

## 2021-08-15 ENCOUNTER — Inpatient Hospital Stay: Payer: Medicaid Other

## 2021-08-15 DIAGNOSIS — J9622 Acute and chronic respiratory failure with hypercapnia: Secondary | ICD-10-CM | POA: Diagnosis not present

## 2021-08-15 DIAGNOSIS — R509 Fever, unspecified: Secondary | ICD-10-CM

## 2021-08-15 DIAGNOSIS — J9621 Acute and chronic respiratory failure with hypoxia: Secondary | ICD-10-CM | POA: Diagnosis not present

## 2021-08-15 LAB — CBC
HCT: 30.7 % — ABNORMAL LOW (ref 39.0–52.0)
Hemoglobin: 9.1 g/dL — ABNORMAL LOW (ref 13.0–17.0)
MCH: 27.2 pg (ref 26.0–34.0)
MCHC: 29.6 g/dL — ABNORMAL LOW (ref 30.0–36.0)
MCV: 91.9 fL (ref 80.0–100.0)
Platelets: 250 10*3/uL (ref 150–400)
RBC: 3.34 MIL/uL — ABNORMAL LOW (ref 4.22–5.81)
RDW: 17.6 % — ABNORMAL HIGH (ref 11.5–15.5)
WBC: 8.2 10*3/uL (ref 4.0–10.5)
nRBC: 0 % (ref 0.0–0.2)

## 2021-08-15 LAB — GLUCOSE, CAPILLARY
Glucose-Capillary: 89 mg/dL (ref 70–99)
Glucose-Capillary: 94 mg/dL (ref 70–99)
Glucose-Capillary: 135 mg/dL — ABNORMAL HIGH (ref 70–99)
Glucose-Capillary: 97 mg/dL (ref 70–99)
Glucose-Capillary: 140 mg/dL — ABNORMAL HIGH (ref 70–99)

## 2021-08-15 LAB — BASIC METABOLIC PANEL
Anion gap: 8 (ref 5–15)
BUN: 8 mg/dL (ref 6–20)
CO2: 38 mmol/L — ABNORMAL HIGH (ref 22–32)
Calcium: 9.2 mg/dL (ref 8.9–10.3)
Chloride: 92 mmol/L — ABNORMAL LOW (ref 98–111)
Creatinine, Ser: 0.41 mg/dL — ABNORMAL LOW (ref 0.61–1.24)
GFR, Estimated: >60 mL/min (ref >60)
Glucose, Bld: 91 mg/dL (ref 70–99)
Potassium: 3.2 mmol/L — ABNORMAL LOW (ref 3.5–5.1)
Sodium: 138 mmol/L (ref 135–145)

## 2021-08-15 LAB — PROCALCITONIN: Procalcitonin: 0.15 ng/mL

## 2021-08-15 LAB — CK: Total CK: 56 U/L (ref 49–397)

## 2021-08-15 LAB — PHOSPHORUS: Phosphorus: 5.4 mg/dL — ABNORMAL HIGH (ref 2.5–4.6)

## 2021-08-15 LAB — MAGNESIUM: Magnesium: 1.6 mg/dL — ABNORMAL LOW (ref 1.7–2.4)

## 2021-08-15 IMAGING — DX DG CHEST 1V PORT
1 series · 1 of 1 positions shown · non-contrast
Comparison: Previous studies including the examination of
[DATE]

CLINICAL DATA: Difficulty breathing, fever

EXAM:
PORTABLE CHEST 1 VIEW

[chest ap]
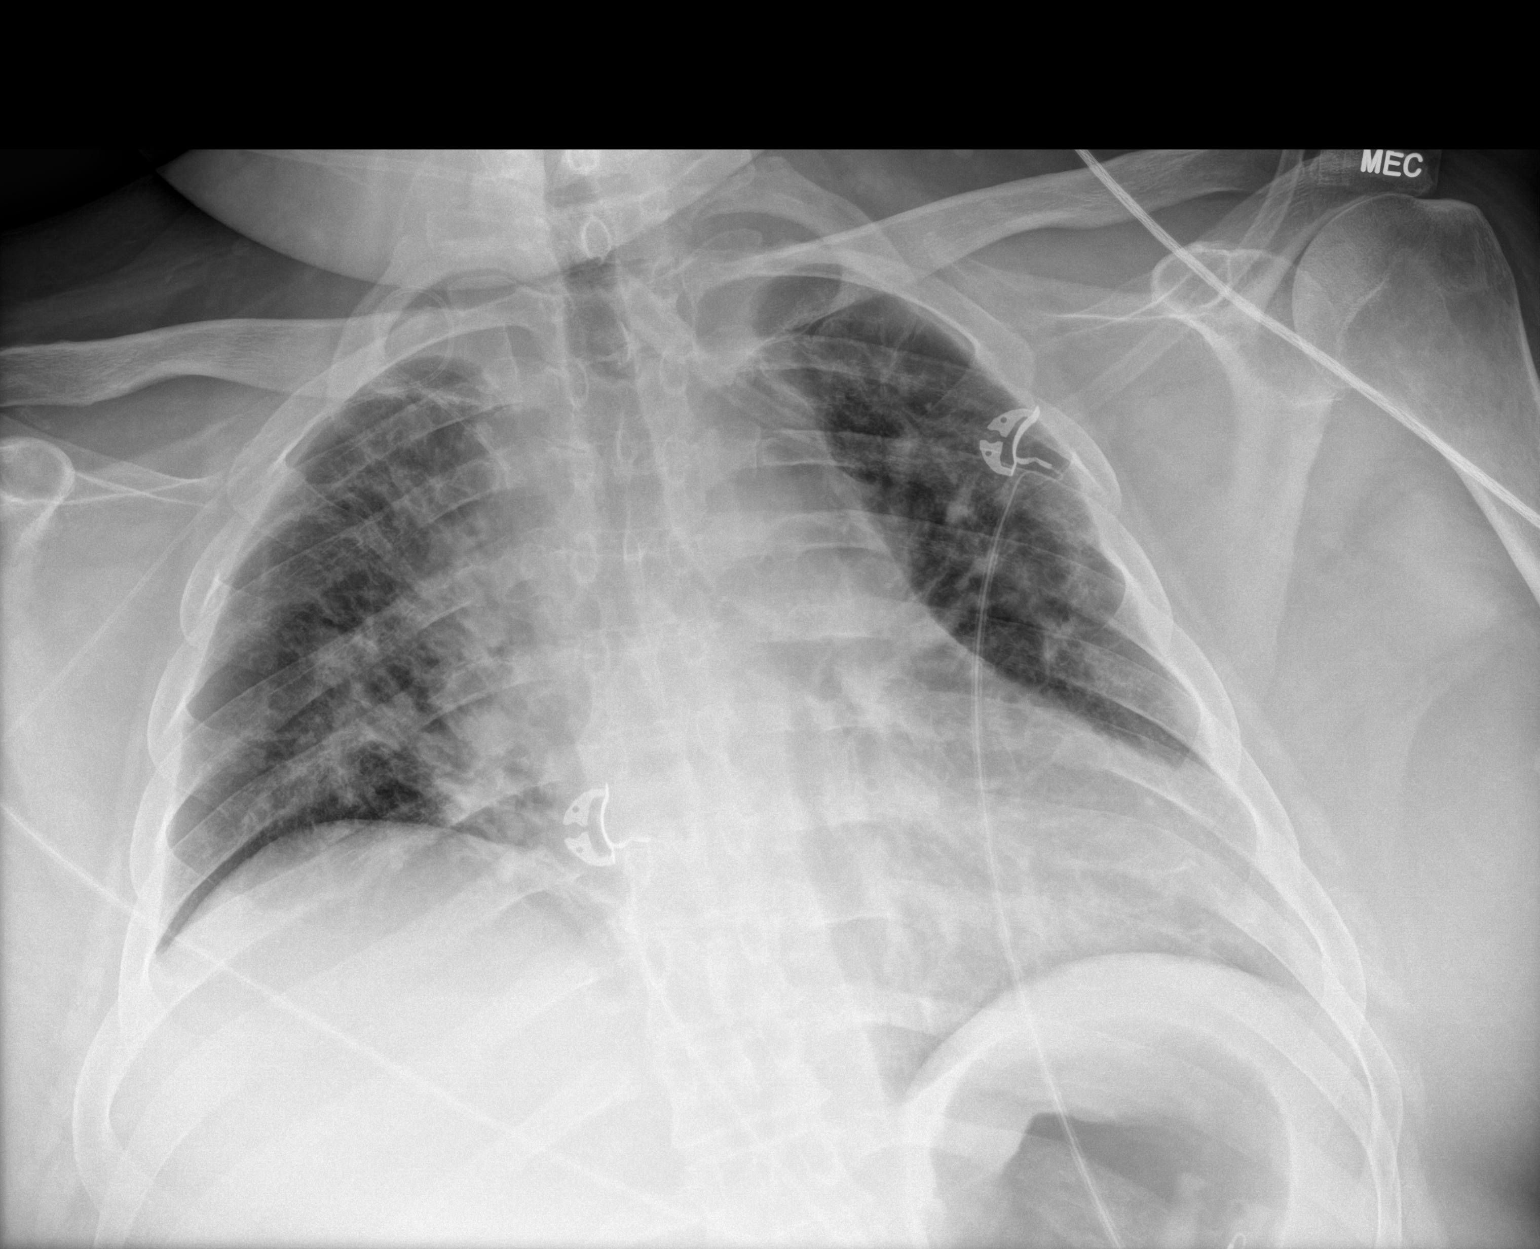

[1 of 1 positions shown; findings below may reference images not displayed]

FINDINGS: Transverse diameter of heart is increased. Increased interstitial
markings are seen in right parahilar region and both lower lung
fields. There is slight improvement in aeration of parahilar
regions. There is interval removal of endotracheal tube. Distal
portion of right PICC line appears to be coiled in the area of right
subclavian vein.
IMPRESSION: Cardiomegaly. Increased interstitial markings are seen in the
parahilar regions and lower lung fields suggesting possible
interstitial pneumonitis. No new focal infiltrates are seen. There
is no significant pleural effusion or pneumothorax.

Distal portion of PICC line introduced through the right upper
extremity appears to be coiled in the area of right subclavian vein.

## 2021-08-15 MED ORDER — POTASSIUM CHLORIDE 10 MEQ/100ML IV SOLN
10.0000 meq | INTRAVENOUS | Status: DC
  Filled 2021-08-15 (×4): qty 100

## 2021-08-15 MED ORDER — DIAZEPAM 5 MG/ML IJ SOLN: 5.0000 mg | Freq: Four times a day (QID) | INTRAMUSCULAR | Status: DC | PRN

## 2021-08-15 MED ORDER — POTASSIUM CHLORIDE CRYS ER 20 MEQ PO TBCR
40.0000 meq | EXTENDED_RELEASE_TABLET | ORAL | Status: AC
  Administered 2021-08-15 (×2): 40 meq via ORAL
  Filled 2021-08-15 (×2): qty 2

## 2021-08-15 MED ORDER — SODIUM CHLORIDE 0.9 % IV SOLN
8.0000 mg/kg | Freq: Every day | INTRAVENOUS | Status: DC
  Administered 2021-08-16 (×2): 800 mg via INTRAVENOUS
  Filled 2021-08-15 (×3): qty 16

## 2021-08-15 MED ORDER — LEVOFLOXACIN 750 MG PO TABS: 750.0000 mg | ORAL_TABLET | Freq: Every day | ORAL | Status: DC

## 2021-08-15 MED ORDER — ADULT MULTIVITAMIN W/MINERALS CH
1.0000 | ORAL_TABLET | Freq: Every day | ORAL | Status: DC
  Administered 2021-08-16 – 2021-08-23 (×7): 1 via ORAL
  Filled 2021-08-15 (×5): qty 1

## 2021-08-15 MED ORDER — ALBUMIN HUMAN 25 % IV SOLN
25.0000 g | Freq: Once | INTRAVENOUS | Status: AC
  Administered 2021-08-15: 14:00:00 25 g via INTRAVENOUS

## 2021-08-15 MED ORDER — MAGNESIUM SULFATE 2 GM/50ML IV SOLN
2.0000 g | Freq: Once | INTRAVENOUS | Status: AC
  Administered 2021-08-15: 10:00:00 2 g via INTRAVENOUS
  Filled 2021-08-15: qty 50

## 2021-08-15 MED ORDER — PIPERACILLIN-TAZOBACTAM 3.375 G IVPB
3.3750 g | Freq: Three times a day (TID) | INTRAVENOUS | Status: DC
Start: 2021-08-15 — End: 2021-08-18
  Administered 2021-08-15 – 2021-08-18 (×8): 3.375 g via INTRAVENOUS
  Filled 2021-08-15 (×8): qty 50

## 2021-08-15 MED ORDER — ASCORBIC ACID 500 MG PO TABS
500.0000 mg | ORAL_TABLET | Freq: Two times a day (BID) | ORAL | Status: DC
  Administered 2021-08-15 – 2021-08-23 (×16): 500 mg via ORAL
  Filled 2021-08-15 (×16): qty 1

## 2021-08-15 MED ORDER — POTASSIUM CHLORIDE CRYS ER 20 MEQ PO TBCR
40.0000 meq | EXTENDED_RELEASE_TABLET | Freq: Two times a day (BID) | ORAL | Status: DC
  Administered 2021-08-16: 10:00:00 40 meq via ORAL
  Filled 2021-08-15: qty 2

## 2021-08-15 MED ORDER — ACETAMINOPHEN 325 MG PO TABS
650.0000 mg | ORAL_TABLET | Freq: Four times a day (QID) | ORAL | Status: DC | PRN
  Administered 2021-08-15 – 2021-08-22 (×3): 650 mg via ORAL
  Filled 2021-08-15 (×3): qty 2

## 2021-08-15 MED ORDER — FUROSEMIDE 10 MG/ML IJ SOLN
40.0000 mg | INTRAMUSCULAR | Status: DC
  Administered 2021-08-17 – 2021-08-21 (×3): 40 mg via INTRAVENOUS
  Filled 2021-08-15 (×3): qty 4

## 2021-08-15 MED ORDER — DULOXETINE HCL 30 MG PO CPEP
60.0000 mg | ORAL_CAPSULE | Freq: Every day | ORAL | Status: DC
  Administered 2021-08-15 – 2021-08-23 (×9): 60 mg via ORAL
  Filled 2021-08-15 (×9): qty 2

## 2021-08-15 MED ORDER — LACTATED RINGERS IV BOLUS
500.0000 mL | Freq: Once | INTRAVENOUS | Status: AC
Start: 2021-08-15 — End: 2021-08-15
  Administered 2021-08-15: 16:00:00 500 mL via INTRAVENOUS

## 2021-08-15 MED ORDER — NEPRO/CARBSTEADY PO LIQD
237.0000 mL | Freq: Three times a day (TID) | ORAL | Status: DC
  Administered 2021-08-15 (×2): 237 mL via ORAL

## 2021-08-15 MED ORDER — FUROSEMIDE 10 MG/ML IJ SOLN
40.0000 mg | Freq: Every day | INTRAMUSCULAR | Status: DC
  Administered 2021-08-15: 10:00:00 40 mg via INTRAVENOUS
  Filled 2021-08-15: qty 4

## 2021-08-15 MED ORDER — ORAL CARE MOUTH RINSE
15.0000 mL | Freq: Two times a day (BID) | OROMUCOSAL | Status: DC
  Administered 2021-08-17 – 2021-08-23 (×12): 15 mL via OROMUCOSAL

## 2021-08-15 NOTE — Progress Notes (Signed)
Date of Admission:  08/09/2021     ID: Craig [name] is a 30 y.o. male  Principal Problem:   Acute on chronic respiratory failure with hypoxia and hypercapnia (HCC)    Subjective: Pt awake and alert Says he is short of bretah Pain joints and muscles Inability to abduct both shoulders New fever today  Medications:   arformoterol  15 mcg Nebulization BID   vitamin C  500 mg Oral BID   budesonide (PULMICORT) nebulizer solution  0.25 mg Nebulization BID   Chlorhexidine Gluconate Cloth  6 each Topical Daily   clonazePAM  1 mg Per Tube BID   docusate  100 mg Per Tube BID   DULoxetine  60 mg Oral Daily   feeding supplement (NEPRO CARB STEADY)  237 mL Oral TID BM   [START ON 08/17/2021] furosemide  40 mg Intravenous QODAY   insulin aspart  0-20 Units Subcutaneous Q4H   lidocaine  1 patch Transdermal Q24H   mouth rinse  15 mL Mouth Rinse BID   [START ON 08/16/2021] multivitamin with minerals  1 tablet Oral Daily   pantoprazole (PROTONIX) IV  40 mg Intravenous QHS   polyethylene glycol  17 g Per Tube Daily   [START ON 08/16/2021] potassium chloride  40 mEq Oral BID   QUEtiapine  25 mg Oral QHS   rivaroxaban  20 mg Per Tube Daily   valproic acid  500 mg Per Tube TID    Objective: Vital signs in last 24 hours: Temp:  [98.4 F (36.9 C)-100.9 F (38.3 C)] 99.4 F (37.4 C) (12/27 1900) Pulse Rate:  [91-136] 127 (12/27 1900) Resp:  [13-36] 27 (12/27 1900) BP: (79-139)/(41-90) 139/72 (12/27 1900) SpO2:  [95 %-100 %] 95 % (12/27 1900) FiO2 (%):  [28 %] 28 % (12/27 0723) Weight:  [137.2 kg] 137.2 kg (12/27 0500)  PHYSICAL EXAM:  General: awake, alert,  Lungs: b/l air entry- rhonchi few Heart: tachycardia Abdomen: Soft, colostomy Extremities: edematous Rt picc Foley Swelling of rt knee- some warmth  Neurologic: Grossly non-focal  Lab Results Recent Labs    08/14/21 0345 08/14/21 1222 08/15/21 0500  WBC 6.1  --  8.2  HGB 8.9*  --  9.1*  HCT 31.0*  --   30.7*  NA 141 143 138  K 3.0* 3.6 3.2*  CL 96* 95* 92*  CO2 36* 38* 38*  BUN 17 13 8   CREATININE 0.43* 0.41* 0.41*    Studies/Results: DG Chest Port 1 View  Result Date: 08/15/2021 CLINICAL DATA:  Difficulty breathing, fever EXAM: PORTABLE CHEST 1 VIEW COMPARISON:  Previous studies including the examination of 08/10/2021 FINDINGS: Transverse diameter of heart is increased. Increased interstitial markings are seen in right parahilar region and both lower lung fields. There is slight improvement in aeration of parahilar regions. There is interval removal of endotracheal tube. Distal portion of right PICC line appears to be coiled in the area of right subclavian vein. IMPRESSION: Cardiomegaly. Increased interstitial markings are seen in the parahilar regions and lower lung fields suggesting possible interstitial pneumonitis. No new focal infiltrates are seen. There is no significant pleural effusion or pneumothorax. Distal portion of PICC line introduced through the right upper extremity appears to be coiled in the area of right subclavian vein. Electronically Signed   By: Elmer Picker M.D.   On: 08/15/2021 13:16     Assessment/Plan: Acute hypoxic and hypercarbic resp falilure on chronic respiratory failure- this episode precipitated by anxiety Pt has underlying OSA but has  never been assessed and given CPAP He has been extubated   New fever- has PICC line for 4 weeks and it is coiled- it needs to be removed Started on zosyn Will add vanco   Seizures- could be related to the acute hypoxia.  EEG done - no seizure activity noted   COVID respiratory illness in 2021 leading to Underlying atelectasis and ILD? need for oxygen /   Recent MSSA bacteremia and Rt septic knee S/p I/D and also received 4 weeks of IV cefazolin- He will need Po for 4 more weeks-   Anemia   Hypokalemia  Discussed the management with the patient, family and hospitalist

## 2021-08-15 NOTE — Evaluation (Addendum)
Physical Therapy Evaluation Patient Details Name: Craig [name] MRN: [id number] DOB: 05-02-1991 Today's Date: 08/15/2021  History of Present Illness  Craig [name] is a 68yoM who comes to [facility] on 12/22 c SOB. PMH: inflammatory arthritis, DVT on xarelto, COVID19 infection c prolonged critical illness Dec '21-Feb '22, post covid tachycardia, CRF on 2L O2, bactremia, PNA, ostomy, gout. Pt appeared to sustain tonic clonc seizure en route to ED. Pt required intubation upon arrival. Pt successfully extubated on 12/26.  Clinical Impression  Pt admitted with above diagnosis. Pt currently with functional limitations due to the deficits listed below (see "PT Problem List"). Patient agreeable to PT evaluation. Home DME updates reviewed with wife, all DME in place. Pt was working with PT at home as well. Pt reports gradual progression of return of Right knee pain after going home, today this Rt knee pain remains severely limiting and painful. Ultimately pain in knee precludes much mobility. Pt has baseline quadriparesis, but does appear significantly more limited and weak today compared to his admission earlier this month. HR upper 120s throughout session. Patient's assessment this date reveals the patient requires an additional person present for safety and/or physical assistance to complete their typical ADL. At baseline, the patient is able to perform ADL with modified independence. Patient will benefit from skilled PT intervention to maximize independence and safety in mobility required for basic ADL performance at discharge.          Recommendations for follow up therapy are one component of a multi-disciplinary discharge planning process, led by the attending physician.  Recommendations may be updated based on patient status, additional functional criteria and insurance authorization.  Follow Up Recommendations Home health PT    Assistance Recommended at Discharge Frequent or constant  Supervision/Assistance  Functional Status Assessment Patient has had a recent decline in their functional status and demonstrates the ability to make significant improvements in function in a reasonable and predictable amount of time.  Equipment Recommendations  None recommended by PT    Recommendations for Other Services       Precautions / Restrictions Precautions Precautions: Fall Precaution Comments: ostomy on Left; quadriparesis      Mobility  Bed Mobility Overal bed mobility:  (unable to tolerate today du eto pain)                  Transfers                        Ambulation/Gait                  Stairs            Wheelchair Mobility    Modified Rankin (Stroke Patients Only)       Balance                                             Pertinent Vitals/Pain Pain Assessment: Faces Faces Pain Scale: Hurts whole lot Pain Location: Rt knee with any mobility or reposition    Home Living Family/patient expects to be discharged to:: Private residence Living Arrangements: Spouse/significant other;Children;Parent Available Help at Discharge: Family;Available 24 hours/day Type of Home: House Home Access: Ramped entrance       Home Layout: Multi-level;Able to live on main level with bedroom/bathroom Home Equipment: Hospital bed;Wheelchair - manual;Rollator (4 wheels) Additional Comments: now has a  hoyer lift adn tempurpedic mattress    Prior Function               Mobility Comments: Pt was working with HHPT from Feb-June/July 2022 & was able to ambulate with RW until R knee pain began. Since then pt has been primarily sleeping/staying in his lift chair (cannot tolerate lying in hospital bed). Wife reports he is able to use grab bars, lift chair +2 family assistance to transfer lift chair>w/c & they bathe him outside with the water hose.       Hand Dominance   Dominant Hand: Right    Extremity/Trunk  Assessment   Upper Extremity Assessment Upper Extremity Assessment: Generalized weakness    Lower Extremity Assessment Lower Extremity Assessment: Generalized weakness       Communication      Cognition Arousal/Alertness: Awake/alert Behavior During Therapy: WFL for tasks assessed/performed Overall Cognitive Status: Within Functional Limits for tasks assessed                                          General Comments      Exercises Other Exercises Other Exercises: AA/ROM left shoulder/elbow x15: 50% of shoulder range Other Exercises: attempted ROM of Rt ankle/knee; not tolerated due to pain.   Assessment/Plan    PT Assessment Patient needs continued PT services  PT Problem List Decreased strength;Decreased mobility;Obesity;Decreased range of motion;Decreased activity tolerance;Cardiopulmonary status limiting activity;Decreased skin integrity;Decreased knowledge of use of DME;Decreased balance       PT Treatment Interventions DME instruction;Therapeutic activities;Modalities;Gait training;Therapeutic exercise;Patient/family education;Stair training;Balance training;Functional mobility training;Manual techniques;Neuromuscular re-education;Wheelchair mobility training    PT Goals (Current goals can be found in the Care Plan section)  Acute Rehab PT Goals Patient Stated Goal: return to home, resume HHPT PT Goal Formulation: With patient/family Time For Goal Achievement: 08/29/21 Potential to Achieve Goals: Fair    Frequency Min 2X/week   Barriers to discharge        Co-evaluation               AM-PAC PT "6 Clicks" Mobility  Outcome Measure Help needed turning from your back to your side while in a flat bed without using bedrails?: Total Help needed moving from lying on your back to sitting on the side of a flat bed without using bedrails?: Total Help needed moving to and from a bed to a chair (including a wheelchair)?: Total Help needed  standing up from a chair using your arms (e.g., wheelchair or bedside chair)?: Total Help needed to walk in hospital room?: Total Help needed climbing 3-5 steps with a railing? : Total 6 Click Score: 6    End of Session Equipment Utilized During Treatment: Oxygen Activity Tolerance: Patient limited by fatigue;Patient limited by pain Patient left: with call bell/phone within reach;with family/visitor present;in bed   PT Visit Diagnosis: Unsteadiness on feet (R26.81);Muscle weakness (generalized) (M62.81);Difficulty in walking, not elsewhere classified (R26.2) Pain - Right/Left: Right Pain - part of body: Knee    Time: 1046-1106 PT Time Calculation (min) (ACUTE ONLY): 20 min   Charges:   PT Evaluation $PT Eval High Complexity: 1 High         4:09 PM, 08/15/21 [name], PT, DPT Physical Therapist - [facility]  [phone] ([place])    Fraida Veldman C 08/15/2021, 4:07 PM

## 2021-08-15 NOTE — Consult Note (Deleted)
Pharmacy Antibiotic Note  Craig [name] is a 30 y.o. male with PMH of inflammatory arthritis, chronic hypoxic and hypercarpneic respiratory failure on 2L O2 PRN, MSSA bacteremia, MSSA pneumonia, perforated diverticulitis s/p partial colectomy with ostomy creation, morbid obesity and goutadmitted on 08/09/2021 with  HCAP .  Pharmacy has been consulted for levofloxacin dosing.  Febrile 100.9, WBC WNL   Bcx sent  Plan: Levofloxacin --Will start levofloxacin 750mg  PO q24h  Will continue to monitor renal function and adjust dose as clinically indicated   Height: 6' 0.99" (185.4 cm) Weight: (!) 137.2 kg (302 lb 7.5 oz) IBW/kg (Calculated) : 79.88  Temp (24hrs), Avg:99.9 F (37.7 C), Min:98.4 F (36.9 C), Max:100.9 F (38.3 C)  Recent Labs  Lab 08/09/21 2247 08/09/21 2321 08/10/21 0043 08/10/21 0339 08/10/21 1434 08/11/21 0609 08/12/21 0618 08/13/21 0430 08/14/21 0345 08/14/21 1222 08/15/21 0500  WBC  --   --   --  9.9  --  4.1 5.5 5.1 6.1  --  8.2  CREATININE  --   --   --  0.37*   < > 0.39* 0.46* 0.31* 0.43* 0.41* 0.41*  LATICACIDVEN 3.7* 2.4* 2.0* 2.0*  --   --   --   --   --   --   --    < > = values in this interval not displayed.    Estimated Creatinine Clearance: 192.8 mL/min (A) (by C-G formula based on SCr of 0.41 mg/dL (L)).    Allergies  Allergen Reactions   Tramadol Hcl     SEIZURES!!! DO NOT REORDER AT DISCHARGE!!!   Fentanyl Citrate Other (See Comments)    Makes patient more agitated & delirious   Amoxicillin Rash    Tolerated cefepime and cefazolin 08/2020.    Antimicrobials this admission: Ongoing Levofloxacin 12/27 >>  Complete  Aztreonam 12/21 x1 Metronidazole/vanc 12/22 x1 Cefazolin 12/22 >> 12/27  Microbiology results: 12/27 BCx: sent  Thank you for allowing pharmacy to be a part of this patients care.  Narda Rutherford, PharmD Pharmacy Resident  08/15/2021 1:43 PM

## 2021-08-15 NOTE — Progress Notes (Signed)
Messaged [facility] doc. Patients heart rate continues to be in the 130's. Orders for fluid bolus. Temp 99.2. Continue to assess.

## 2021-08-15 NOTE — Consult Note (Addendum)
[facility] for Electrolyte Monitoring and Replacement   Recent Labs: Potassium (mmol/L)  Date Value  08/15/2021 3.2 (L)   Magnesium (mg/dL)  Date Value  08/15/2021 1.6 (L)   Calcium (mg/dL)  Date Value  08/15/2021 9.2   Albumin (g/dL)  Date Value  08/09/2021 3.1 (L)   Phosphorus (mg/dL)  Date Value  08/15/2021 5.4 (H)   Sodium (mmol/L)  Date Value  08/15/2021 138   Assessment: 30 year-old male with PMH of inflammatory arthritis, chronic hypoxic and hypercarpneic respiratory failure on 2L O2 PRN, MSSA bacteremia, MSSA pneumonia, perforated diverticulitis s/p partial colectomy with ostomy creation, morbid obesity and gout who was admitted to the hospital for SOB and was subsequently intubated on 12/21 and extubated 12/26. Pharmacy was consulted for electrolyte monitoring.   Diuretics: furosemide 40mg  IV once daily (frequency decreased 12/27)  Labs Na 143>138 K 3.6>3.2 Phos 4.8>5.4 Mg 2.1>1.4  Goal of Therapy:  Potassium 4.0 - 5.1 mmol/L Magnesium 2.0 - 2.4 mg/dL All other electrolytes WNL  Plan:  --MD placed orders for 2 grams IV magnesium and 62mEq KCl PO q2h x2 --78meQ KCl PO BID will be started 12/28 --Will continue to monitor and replace electrolytes as clinically indicated   Vallery Sa, PharmD, [place], PharmD Pharmacy Resident  08/15/2021 11:57 AM

## 2021-08-15 NOTE — Progress Notes (Signed)
PROGRESS NOTE    Craig [name]  IFO:[id number] DOB: 10-11-1990 DOA: 08/09/2021 PCP: [name], MD    Brief Narrative:  30 year old male with extensive past medical history including inflammatory arthritis on methotrexate and chronic steroids, history of severe COVID-19 infection requiring prolonged hospitalization and tracheostomy, since decannulated in December 20 10 October 2020, chronic hypoxic and hypercapnic respiratory failure on 2 L baseline nasal cannula, history of MSSA bacteremia and pneumonia, perforated diverticulitis status post partial colectomy and ostomy, morbid obesity, gout who presented to the ED with chief complaint of acute shortness of breath.   Patient intubated on admission due to severe hypoxic and hypercarbic respiratory failure in the setting of obesity, OHS, severe deconditioning.  Treated for sepsis secondary to MSSA bacteremia associated with right knee septic arthritis status post I&D x2.  Seen by ID.  TEE negative.   Patient successfully extubated on 12/26.  He requires nightly BiPAP.  Outpatient pulmonologist Dr. Lanney Gins.  Remains markedly deconditioned but stabilized from ICU standpoint.  12/27: Patient had new onset low-grade fever.  T-max 100.9.  Some coarse breath sounds.  Case discussed with ID who will expand antibiotics.   Assessment & Plan:   Principal Problem:   Acute on chronic respiratory failure with hypoxia and hypercapnia (HCC)  Acute on chronic hypoxic and hypercarbic respiratory failure  Sarcopenic obesity with obesity hypoventilation, severe deconditioning PMHx: Covid-19 pneumonia, MSSA pneumonia, Morbid obesity, probable OHS/OSA,  Multifactorial secondary to sepsis, OSA, OHS Successfully extubated on 12/26 Weaned to BiPAP Mentating clearly Still looks weak and deconditioned with increased work of breathing Plan: Mandatory nightly BiPAP Nasal cannula during the day Aggressive pulmonary hygiene   Sepsis 2/2 MSSA  bacteremia with MSSA right knee septic arthritis s/p I&D x2 Lactic acidosis, sepsis physiology corrected Patient seen by ID on 12/20. Was to complete IV Cefazolin on 12/22 then plan to transition to Cefadroxil continue IV cefazolin while inpatient -TEE on 11/23 negative 12/27: New onset fever, T-max 100.9 Plan: Case discussed with infectious disease.  Will escalate antibiotic regimen Anticipate Zosyn New blood cultures, follow Monitor leukocytosis and fever curve Strict ins and outs Steroids per home dose   Acute metabolic encephalopathy likely multifactorial in the setting of sepsis Baseline issues with anxiety/impulsivity -CT Head negative for acute abnormality -Limited MRI negative for acute abnormality -EEG without seizure activity -Supportive care -Depakote for mood stabilization, 500 milligrams p.o. 3 times daily -Klonopin 1 mg 3 times daily -As needed Ativan -Avoid fentanyl due to paradoxic agitation   Seizure Like Activity Similar presentation during last admission, Tramadol was previously discontinued due to suspected seizures, it appears that this was restarted as outpatient, now [place]. -CT Head shows no acute intracranial abnormality -MRI shows no acute abnormality though limited study -EEG showed no seizure activity -May have been myoclonic activity due to hypoxia -Discontinued Keppra -Depacon switched to Depakene 500 mg every 8 adjusting for levels -Sedation as above weaning as able -Seizure precautions -Valproic acid level was low, dose adjustment made   H/o upper extremity DVT It appears that he was started on Xarelto before around Feb/Mar 2022 for provoked upper extremity DVT.   -CTA chest neg for PE -Continue Xarelto    Anasarca -Appears to becoming intravascularly depleted -Received Lasix 40 mg IV on 12/27 -Net -4 L over last 24 hours -Anasarca is improving Plan: Hold tomorrow's dose of Lasix Consider acetazolamide Monitor volume status, anasarca  improving   Diabetes mellitus Last HgbA1c 6.8 on 07/08/21 -CBGs -Sliding scale insulin -Hold home Meds  DVT prophylaxis: Xarelto Code Status: Full Family Communication: None today Disposition Plan: Status is: Inpatient  Remains inpatient appropriate because: Hypercapnic and hypercarbic respiratory failure.  New onset fever.  MSSA sepsis.  Prognosis remains guarded.       Level of care: Telemetry Medical  Consultants:  Infectious disease  Procedures:  None  Antimicrobials: Ancef   Subjective: Seen and examined.  On BiPAP this morning.  Remains lethargic.  Objective: Vitals:   08/15/21 0700 08/15/21 0800 08/15/21 1100 08/15/21 1200  BP: (!) 102/53 124/67    Pulse: 100 (!) 113    Resp: (!) 21 (!) 21    Temp:   (!) 100.9 F (38.3 C) (!) 100.9 F (38.3 C)  TempSrc:   Oral   SpO2: 96% 99%    Weight:      Height:        Intake/Output Summary (Last 24 hours) at 08/15/2021 1406 Last data filed at 08/15/2021 1300 Gross per 24 hour  Intake 720 ml  Output 5000 ml  Net -4280 ml   Filed Weights   08/13/21 0420 08/14/21 0500 08/15/21 0500  Weight: (!) 138.9 kg (!) 136.9 kg (!) 137.2 kg    Examination:  General exam: On BiPAP.  No acute distress.  Appears lethargic and chronically ill Respiratory system: Coarse breath sounds bilaterally.  Normal work of breathing.  BiPAP Cardiovascular system: Tachycardic, regular rhythm, no murmurs, diffuse Gastrointestinal system: Obese, NT/ND, normal bowel sounds Central nervous system: Lethargic.  Oriented x2.  No focal deficits Extremities: Diffusely decreased power Skin: No rashes, lesions or ulcers Psychiatry: Judgement and insight appear impaired. Mood & affect flattened.     Data Reviewed: I have personally reviewed following labs and imaging studies  CBC: Recent Labs  Lab 08/11/21 0609 08/12/21 0618 08/13/21 0430 08/14/21 0345 08/15/21 0500  WBC 4.1 5.5 5.1 6.1 8.2  HGB 8.4* 9.2* 8.7* 8.9* 9.1*  HCT  28.4* 30.9* 29.8* 31.0* 30.7*  MCV 93.4 93.6 93.4 95.4 91.9  PLT 179 183 176 200 811   Basic Metabolic Panel: Recent Labs  Lab 08/11/21 0609 08/12/21 0618 08/13/21 0430 08/14/21 0345 08/14/21 1222 08/15/21 0500  NA 142 140 142 141 143 138  K 3.6 3.8 2.8* 3.0* 3.6 3.2*  CL 102 100 98 96* 95* 92*  CO2 35* 36* 35* 36* 38* 38*  GLUCOSE 126* 164* 183* 153* 102* 91  BUN 6 11 15 17 13 8   CREATININE 0.39* 0.46* 0.31* 0.43* 0.41* 0.41*  CALCIUM 9.4 9.2 9.4 9.3 9.6 9.2  MG 1.8 1.7 1.9 1.4* 2.1 1.6*  PHOS 3.6 4.9* 4.6 4.8*  --  5.4*   GFR: Estimated Creatinine Clearance: 192.8 mL/min (A) (by C-G formula based on SCr of 0.41 mg/dL (L)). Liver Function Tests: Recent Labs  Lab 08/09/21 2246  AST 57*  ALT 8  ALKPHOS 70  BILITOT 0.9  PROT 7.8  ALBUMIN 3.1*   No results for input(s): LIPASE, AMYLASE in the last 168 hours. No results for input(s): AMMONIA in the last 168 hours. Coagulation Profile: Recent Labs  Lab 08/09/21 2313  INR 1.2   Cardiac Enzymes: No results for input(s): CKTOTAL, CKMB, CKMBINDEX, TROPONINI in the last 168 hours. BNP (last 3 results) No results for input(s): PROBNP in the last 8760 hours. HbA1C: No results for input(s): HGBA1C in the last 72 hours. CBG: Recent Labs  Lab 08/14/21 1929 08/14/21 2341 08/15/21 0413 08/15/21 0723 08/15/21 1124  GLUCAP 89 95 89 94 135*   Lipid Profile: Recent Labs  08/14/21 0345  TRIG 268*   Thyroid Function Tests: No results for input(s): TSH, T4TOTAL, FREET4, T3FREE, THYROIDAB in the last 72 hours. Anemia Panel: No results for input(s): VITAMINB12, FOLATE, FERRITIN, TIBC, IRON, RETICCTPCT in the last 72 hours. Sepsis Labs: Recent Labs  Lab 08/09/21 2247 08/09/21 2321 08/10/21 0043 08/10/21 0050 08/10/21 0339 08/15/21 1245  PROCALCITON  --   --   --  0.27  --  0.15  LATICACIDVEN 3.7* 2.4* 2.0*  --  2.0*  --     Recent Results (from the past 240 hour(s))  Blood culture (routine x 2)     Status:  None   Collection Time: 08/09/21 10:47 PM   Specimen: BLOOD RIGHT HAND  Result Value Ref Range Status   Specimen Description BLOOD RIGHT HAND  Final   Special Requests   Final    BOTTLES DRAWN AEROBIC AND ANAEROBIC Blood Culture adequate volume   Culture   Final    NO GROWTH 5 DAYS Performed at [facility], [address], [place], [place] [address]    Report Status 08/14/2021 FINAL  Final  Urine Culture     Status: None   Collection Time: 08/09/21 10:47 PM   Specimen: Urine, Catheterized  Result Value Ref Range Status   Specimen Description   Final    URINE, CATHETERIZED Performed at [facility], [address], [place], [place] [address]    Special Requests   Final    NONE Performed at [facility], [address] [place], [place] [address]    Culture   Final    NO GROWTH Performed at [facility], [place] [address], [place],  [address]    Report Status 08/11/2021 FINAL  Final  Resp Panel by RT-PCR (Flu A&B, Covid) Nasopharyngeal Swab     Status: None   Collection Time: 08/09/21 10:48 PM   Specimen: Nasopharyngeal Swab; Nasopharyngeal(NP) swabs in vial transport medium  Result Value Ref Range Status   SARS Coronavirus 2 by RT PCR NEGATIVE NEGATIVE Final    Comment: (NOTE) SARS-CoV-2 target nucleic acids are NOT DETECTED.  The SARS-CoV-2 RNA is generally detectable in upper respiratory specimens during the acute phase of infection. The lowest concentration of SARS-CoV-2 viral copies this assay can detect is 138 copies/mL. A negative result does not preclude SARS-Cov-2 infection and should not be used as the sole basis for treatment or other patient management decisions. A negative result may occur with  improper specimen collection/handling, submission of specimen other than nasopharyngeal swab, presence of viral mutation(s) within the areas targeted by this assay, and inadequate number of viral copies(<138 copies/mL). A  negative result must be combined with clinical observations, patient history, and epidemiological information. The expected result is Negative.  Fact Sheet for Patients:  EntrepreneurPulse.com.au  Fact Sheet for Healthcare Providers:  IncredibleEmployment.be  This test is no t yet approved or cleared by the [place] FDA and  has been authorized for detection and/or diagnosis of SARS-CoV-2 by FDA under an Emergency Use Authorization (EUA). This EUA will remain  in effect (meaning this test can be used) for the duration of the COVID-19 declaration under Section 564(b)(1) of the Act, 21 U.S.C.section 360bbb-3(b)(1), unless the authorization is terminated  or revoked sooner.       Influenza A by PCR NEGATIVE NEGATIVE Final   Influenza B by PCR NEGATIVE NEGATIVE Final    Comment: (NOTE) The Xpert Xpress SARS-CoV-2/FLU/RSV plus assay is intended as an aid in the  diagnosis of influenza from Nasopharyngeal swab specimens and should not be used as a sole basis for treatment. Nasal washings and aspirates are unacceptable for Xpert Xpress SARS-CoV-2/FLU/RSV testing.  Fact Sheet for Patients: EntrepreneurPulse.com.au  Fact Sheet for Healthcare Providers: IncredibleEmployment.be  This test is not yet approved or cleared by the [place] FDA and has been authorized for detection and/or diagnosis of SARS-CoV-2 by FDA under an Emergency Use Authorization (EUA). This EUA will remain in effect (meaning this test can be used) for the duration of the COVID-19 declaration under Section 564(b)(1) of the Act, 21 U.S.C. section 360bbb-3(b)(1), unless the authorization is terminated or revoked.  Performed at [facility], [address] [place], [place] [address]   Blood culture (routine x 2)     Status: None   Collection Time: 08/09/21 11:21 PM   Specimen: BLOOD LEFT HAND  Result Value Ref Range Status    Specimen Description BLOOD LEFT HAND  Final   Special Requests   Final    PEDIATRICS Blood Culture results may not be optimal due to an excessive volume of blood received in culture bottles   Culture   Final    NO GROWTH 5 DAYS Performed at [facility], [address] [place], [place] [address]    Report Status 08/14/2021 FINAL  Final         Radiology Studies: [facility] Chest Port 1 View  Result Date: 08/15/2021 CLINICAL DATA:  Difficulty breathing, fever EXAM: PORTABLE CHEST 1 VIEW COMPARISON:  Previous studies including the examination of 08/10/2021 FINDINGS: Transverse diameter of heart is increased. Increased interstitial markings are seen in right parahilar region and both lower lung fields. There is slight improvement in aeration of parahilar regions. There is interval removal of endotracheal tube. Distal portion of right PICC line appears to be coiled in the area of right subclavian vein. IMPRESSION: Cardiomegaly. Increased interstitial markings are seen in the parahilar regions and lower lung fields suggesting possible interstitial pneumonitis. No new focal infiltrates are seen. There is no significant pleural effusion or pneumothorax. Distal portion of PICC line introduced through the right upper extremity appears to be coiled in the area of right subclavian vein. Electronically Signed   By: Elmer Picker M.D.   On: 08/15/2021 13:16        Scheduled Meds:  arformoterol  15 mcg Nebulization BID   vitamin C  500 mg Oral BID   budesonide (PULMICORT) nebulizer solution  0.25 mg Nebulization BID   Chlorhexidine Gluconate Cloth  6 each Topical Daily   clonazePAM  1 mg Per Tube BID   docusate  100 mg Per Tube BID   DULoxetine  60 mg Oral Daily   feeding supplement (NEPRO CARB STEADY)  237 mL Oral TID BM   [START ON 08/17/2021] furosemide  40 mg Intravenous QODAY   insulin aspart  0-20 Units Subcutaneous Q4H   lidocaine  1 patch Transdermal Q24H   mouth rinse  15  mL Mouth Rinse BID   [START ON 08/16/2021] multivitamin with minerals  1 tablet Oral Daily   pantoprazole (PROTONIX) IV  40 mg Intravenous QHS   polyethylene glycol  17 g Per Tube Daily   [START ON 08/16/2021] potassium chloride  40 mEq Oral BID   QUEtiapine  25 mg Oral QHS   rivaroxaban  20 mg Per Tube Daily   valproic acid  500 mg Per Tube TID   Continuous Infusions:   ceFAZolin (ANCEF) IV 2 g (08/15/21 1331)  LOS: 5 days    Time spent: 35 minutes    Sidney Ace, MD [facility]   If 7PM-7AM, please contact night-coverage  08/15/2021, 2:06 PM

## 2021-08-15 NOTE — Evaluation (Signed)
Occupational Therapy Evaluation Patient Details Name: Craig [name] MRN: [id number] DOB: 1991/03/07 Today's Date: 08/15/2021   History of Present Illness Craig [name] is a 73yoM who comes to [facility] [place] on 12/22 c SOB. PMH: inflammatory arthritis, DVT on xarelto, COVID19 infection c prolonged critical illness Dec '21-Feb '22, post covid tachycardia, CRF on 2L O2, bactremia, PNA, ostomy, gout. Pt appeared to sustain tonic clonc seizure en route to ED. Pt required intubation upon arrival. Pt successfully extubated on 12/26.   Clinical Impression   Pt seen for OT evaluation this date. Upon arrival to room, pt asleep in bed with HR elevated at 128bpm. RN cleared pt for light activity during OT eval/tx. Since July 2022 hospital admission, pt has been sleeping in recliner, receiving MAX A+2 for transfers chair<>wheelchair, and receiving physical assistance from family for dressing and bathing. Pt reports that he is typically independent with feeding, grooming, and using urinal. Pt currently presents with L shoulder pain with movement, limited R shoulder ROM, decreased strength, and decreased activity tolerance. Due to these functional impairments, pt requires MIN A for oral care and MOD A to wash face at bed-level. Pt noted to have edema in UE forearms and digits; pt participated in UE therapy exercises (see below) to promote improved circulation and strength of b/l UE. At end of session, b/l UE propped on pillows for elevation. HR 136-138 during bed-level ADLs/exercises. Pt would benefit from additional skilled OT services to maximize return to PLOF and minimize risk of future falls, injury, caregiver burden, and readmission. Pt has needed increased assistance at home prior to admission and has all necessary DME. Recommend pt resume HHOT interventions upon discharge.      Recommendations for follow up therapy are one component of a multi-disciplinary discharge planning process, led by the attending  physician.  Recommendations may be updated based on patient status, additional functional criteria and insurance authorization.   Follow Up Recommendations  Home health OT    Assistance Recommended at Discharge Frequent or constant Supervision/Assistance  Functional Status Assessment  Patient has had a recent decline in their functional status and/or demonstrates limited ability to make significant improvements in function in a reasonable and predictable amount of time  Equipment Recommendations  None recommended by OT (pt has all necessary DME)       Precautions / Restrictions Precautions Precautions: Fall Precaution Comments: ostomy on left      Mobility Bed Mobility Overal bed mobility:  (deferred 2/2 elevated HR with bed-level ADLs)                                        ADL either performed or assessed with clinical judgement   ADL Overall ADL's : Needs assistance/impaired     Grooming: Oral care;Minimal assistance;Moderate assistance;Bed level Grooming Details (indicate cue type and reason): With increased time/effort, pt able to perform oral care with oral swab, requiring only MIN A to apply mouth wash on oral swab. Pt able to wash ~40% of face with washcloth, requiring MOD A to clean remaining portion                                     Vision Baseline Vision/History: 1 Wears glasses Ability to See in Adequate Light: 0 Adequate Patient Visual Report: No change from baseline  Pertinent Vitals/Pain Pain Assessment: Faces Faces Pain Scale: Hurts whole lot Pain Location: Rt knee with any mobility or reposition Pain Descriptors / Indicators: Grimacing;Sore Pain Intervention(s): Limited activity within patient's tolerance;Monitored during session;Repositioned     Hand Dominance Right   Extremity/Trunk Assessment Upper Extremity Assessment Upper Extremity Assessment: RUE deficits/detail;LUE deficits/detail RUE  Deficits / Details: Unable to flex shoulder. Strength at least 3/5 in all distal movements RUE: Unable to fully assess due to pain;Shoulder pain with ROM LUE Deficits / Details: Unable to flex shoulder > 30degrees. Strength at least 3/5 in all distal movements LUE: Shoulder pain with ROM   Lower Extremity Assessment Lower Extremity Assessment: Generalized weakness       Communication Communication Communication: No difficulties   Cognition Arousal/Alertness: Awake/alert Behavior During Therapy: WFL for tasks assessed/performed Overall Cognitive Status: Within Functional Limits for tasks assessed                                          Exercises General Exercises - Upper Extremity Elbow Flexion: AROM;Both;15 reps;Supine Elbow Extension: AROM;Both;15 reps;Supine Wrist Flexion: AROM;Both;10 reps;Supine Wrist Extension: AROM;Both;10 reps;Supine Digit Composite Flexion: AROM;Both;10 reps;Supine Other Exercises Other Exercises: AA/ROM left shoulder/elbow x15: 50% of shoulder range Other Exercises: attempted ROM of Rt ankle/knee; not tolerated due to pain.        Home Living Family/patient expects to be discharged to:: Private residence Living Arrangements: Spouse/significant other;Children;Parent Available Help at Discharge: Family;Available 24 hours/day Type of Home: House Home Access: Ramped entrance     Home Layout: Multi-level;Able to live on main level with bedroom/bathroom     Bathroom Shower/Tub: Walk-in shower;Other (comment) (pt reports family bathes him outside with a water hose)         Home Equipment: Hospital bed;Wheelchair Probation officer (4 wheels)   Additional Comments: now has a Civil Service fast streamer and tempurpedic mattress      Prior Functioning/Environment               Mobility Comments: Pt was working with HHPT from Feb-June/July 2022 & was able to ambulate with RW until R knee pain began. Since then pt has been primarily  sleeping/staying in his lift chair (cannot tolerate lying in hospital bed). Wife reports he is able to use grab bars, lift chair +2 family assistance to transfer lift chair>w/c ADLs Comments: Pt reports that he is typically independent with feeding, grooming, and using urinal, and that he has been getting assistance from family for dressing and bathing. pt reports that family bathes him outside with the water hose.        OT Problem List: Decreased strength;Decreased activity tolerance;Impaired balance (sitting and/or standing);Decreased safety awareness;Decreased range of motion      OT Treatment/Interventions: Self-care/ADL training;Therapeutic exercise;Therapeutic activities;Energy conservation;Patient/family education;DME and/or AE instruction;Balance training    OT Goals(Current goals can be found in the care plan section) Acute Rehab OT Goals Patient Stated Goal: to get stronger OT Goal Formulation: With patient Time For Goal Achievement: 08/29/21 Potential to Achieve Goals: Fair ADL Goals Pt Will Perform Grooming: with supervision;bed level Pt Will Perform Upper Body Dressing: with set-up;with supervision;sitting Pt/caregiver will Perform Home Exercise Program: Increased strength;Both right and left upper extremity;With written HEP provided;With Supervision  OT Frequency: Min 2X/week    AM-PAC OT "6 Clicks" Daily Activity     Outcome Measure Help from another person eating meals?: A Little Help from another person  taking care of personal grooming?: A Lot Help from another person toileting, which includes using toliet, bedpan, or urinal?: Total Help from another person bathing (including washing, rinsing, drying)?: Total Help from another person to put on and taking off regular upper body clothing?: A Lot Help from another person to put on and taking off regular lower body clothing?: Total 6 Click Score: 10   End of Session Equipment Utilized During Treatment: Oxygen Nurse  Communication: Mobility status  Activity Tolerance: Patient tolerated treatment well Patient left: in bed;with call bell/phone within reach;with bed alarm set  OT Visit Diagnosis: Unsteadiness on feet (R26.81);Repeated falls (R29.6);Muscle weakness (generalized) (M62.81)                Time: 3700-5259 OT Time Calculation (min): 33 min Charges:  OT General Charges $OT Visit: 1 Visit OT Evaluation $OT Eval Moderate Complexity: 1 Mod OT Treatments $Self Care/Home Management : 8-22 mins  Fredirick Maudlin, OTR/L [place]

## 2021-08-15 NOTE — Consult Note (Signed)
Pharmacy Antibiotic Note  Craig [name] is a 30 y.o. male admitted on 08/09/2021 with fever - R/O CLABSI.  Pharmacy has been consulted for daptomycin dosing per ID.  Plan: Daptomycin IV  8mg /kg ABW (800mg ) daily starting 12/27 2200 .  Height: 6' 0.99" (185.4 cm) Weight: (!) 137.2 kg (302 lb 7.5 oz) IBW/kg (Calculated) : 79.88  Temp (24hrs), Avg:99.7 F (37.6 C), Min:98.4 F (36.9 C), Max:100.9 F (38.3 C)  Recent Labs  Lab 08/09/21 2247 08/09/21 2321 08/10/21 0043 08/10/21 0339 08/10/21 1434 08/11/21 0609 08/12/21 0618 08/13/21 0430 08/14/21 0345 08/14/21 1222 08/15/21 0500  WBC  --   --   --  9.9  --  4.1 5.5 5.1 6.1  --  8.2  CREATININE  --   --   --  0.37*   < > 0.39* 0.46* 0.31* 0.43* 0.41* 0.41*  LATICACIDVEN 3.7* 2.4* 2.0* 2.0*  --   --   --   --   --   --   --    < > = values in this interval not displayed.    Estimated Creatinine Clearance: 192.8 mL/min (A) (by C-G formula based on SCr of 0.41 mg/dL (L)).    Allergies  Allergen Reactions   Tramadol Hcl     SEIZURES!!! DO NOT REORDER AT DISCHARGE!!!   Fentanyl Citrate Other (See Comments)    Makes patient more agitated & delirious   Amoxicillin Rash    Tolerated cefepime and cefazolin 08/2020.    Antimicrobials this admission: Daptomycin (12/27 >>  Zosyn (12/27 >>   Dose adjustments this admission: Will CTM and adjust PRN  Microbiology results: 12/27 BCx: NGTD 12/21 Bcx: NGTD 12/21 UCx: NGTD  12/21 FLU/COV: negative  Thank you for allowing pharmacy to be a part of this patients care.  Lorna Dibble 08/15/2021 8:53 PM

## 2021-08-15 NOTE — Progress Notes (Signed)
Spoke with Katharine Look concerning PICC coiled in Subclavian vein, told to stop infusions and use PIV at this time. Will reassess in the am for exchange vs possible removal of PICC due to blood cultures that were drawn today. Will continue to monitor.

## 2021-08-16 DIAGNOSIS — R509 Fever, unspecified: Secondary | ICD-10-CM | POA: Diagnosis not present

## 2021-08-16 DIAGNOSIS — J9622 Acute and chronic respiratory failure with hypercapnia: Secondary | ICD-10-CM | POA: Diagnosis not present

## 2021-08-16 DIAGNOSIS — J9621 Acute and chronic respiratory failure with hypoxia: Secondary | ICD-10-CM | POA: Diagnosis not present

## 2021-08-16 LAB — RESPIRATORY PANEL BY PCR

## 2021-08-16 LAB — CBC WITH DIFFERENTIAL/PLATELET
Abs Immature Granulocytes: 0.03 10*3/uL (ref 0.00–0.07)
Basophils Absolute: 0 10*3/uL (ref 0.0–0.1)
Basophils Relative: 0 %
Eosinophils Absolute: 0.4 10*3/uL (ref 0.0–0.5)
Eosinophils Relative: 6 %
HCT: 30.2 % — ABNORMAL LOW (ref 39.0–52.0)
Hemoglobin: 8.7 g/dL — ABNORMAL LOW (ref 13.0–17.0)
Immature Granulocytes: 0 %
Lymphocytes Relative: 19 %
Lymphs Abs: 1.3 10*3/uL (ref 0.7–4.0)
MCH: 27.1 pg (ref 26.0–34.0)
MCHC: 28.8 g/dL — ABNORMAL LOW (ref 30.0–36.0)
MCV: 94.1 fL (ref 80.0–100.0)
Monocytes Absolute: 0.8 10*3/uL (ref 0.1–1.0)
Monocytes Relative: 11 %
Neutro Abs: 4.4 10*3/uL (ref 1.7–7.7)
Neutrophils Relative %: 64 %
Platelets: 247 10*3/uL (ref 150–400)
RBC: 3.21 MIL/uL — ABNORMAL LOW (ref 4.22–5.81)
RDW: 17.3 % — ABNORMAL HIGH (ref 11.5–15.5)
WBC: 7 10*3/uL (ref 4.0–10.5)
nRBC: 0 % (ref 0.0–0.2)

## 2021-08-16 LAB — GLUCOSE, CAPILLARY
Glucose-Capillary: 100 mg/dL — ABNORMAL HIGH (ref 70–99)
Glucose-Capillary: 115 mg/dL — ABNORMAL HIGH (ref 70–99)
Glucose-Capillary: 123 mg/dL — ABNORMAL HIGH (ref 70–99)
Glucose-Capillary: 133 mg/dL — ABNORMAL HIGH (ref 70–99)
Glucose-Capillary: 135 mg/dL — ABNORMAL HIGH (ref 70–99)
Glucose-Capillary: 149 mg/dL — ABNORMAL HIGH (ref 70–99)

## 2021-08-16 LAB — RESP PANEL BY RT-PCR (FLU A&B, COVID) ARPGX2
Influenza A by PCR: NEGATIVE
Influenza B by PCR: NEGATIVE
SARS Coronavirus 2 by RT PCR: NEGATIVE

## 2021-08-16 LAB — BASIC METABOLIC PANEL
Anion gap: 9 (ref 5–15)
BUN: 6 mg/dL (ref 6–20)
CO2: 37 mmol/L — ABNORMAL HIGH (ref 22–32)
Calcium: 9.4 mg/dL (ref 8.9–10.3)
Chloride: 91 mmol/L — ABNORMAL LOW (ref 98–111)
Creatinine, Ser: 0.32 mg/dL — ABNORMAL LOW (ref 0.61–1.24)
GFR, Estimated: >60 mL/min (ref >60)
Glucose, Bld: 117 mg/dL — ABNORMAL HIGH (ref 70–99)
Potassium: 4 mmol/L (ref 3.5–5.1)
Sodium: 137 mmol/L (ref 135–145)

## 2021-08-16 LAB — PHOSPHORUS: Phosphorus: 5 mg/dL — ABNORMAL HIGH (ref 2.5–4.6)

## 2021-08-16 LAB — MAGNESIUM: Magnesium: 1.7 mg/dL (ref 1.7–2.4)

## 2021-08-16 MED ORDER — METOPROLOL SUCCINATE ER 50 MG PO TB24
100.0000 mg | ORAL_TABLET | Freq: Every day | ORAL | Status: DC
  Administered 2021-08-16 – 2021-08-23 (×8): 100 mg via ORAL
  Filled 2021-08-16 (×8): qty 2

## 2021-08-16 MED ORDER — BOOST / RESOURCE BREEZE PO LIQD CUSTOM
1.0000 | Freq: Three times a day (TID) | ORAL | Status: DC
  Administered 2021-08-16: 16:00:00 1 via ORAL

## 2021-08-16 MED ORDER — PROSOURCE PLUS PO LIQD
30.0000 mL | Freq: Three times a day (TID) | ORAL | Status: DC
  Administered 2021-08-16 – 2021-08-17 (×2): 30 mL via ORAL
  Filled 2021-08-16 (×8): qty 30

## 2021-08-16 NOTE — Progress Notes (Addendum)
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  On arrival to the ED, patient was being bagged by EMS to maintain SPO2 in the low 90s.  He was noted to have an elevated WBC count of 22, tachypneic, tachycardic and hypoxia with concern for sepsis secondary to pneumonia in the setting of seizure activity.  Patient was started on empiric antibiotics and intubated by EDP for severe hypoxic/hypercarbic respite failure in the setting of obesity, OHS, severe deconditioning..  Patient was initially admitted to the critical care service.  Patient was extubated on 08/14/2021.  Patient was transferred to hospital service on 12/27.   Assessment & Plan:   Principal Problem:   Acute on chronic respiratory failure with hypoxia and hypercapnia (HCC)   Acute on chronic hypoxic and hypercarbic respiratory failure, POA Morbid obesity with obesity hypoventilation, severe deconditioning Patient with history of severe Covid-19 pneumonia initially requiring tracheostomy which has subsequently been decannulated, MSSA pneumonia, Morbid obesity, probable OHS/OSA and acute worsening likely secondary to multifactorial etiology from sepsis, OSA, OHS.  Was initially admitted by [facility] service  after being intubated by ED physician.  Was successfully extubated on 08/14/2021 to BiPAP. --Continue BiPAP nightly, social work assisting with NIV for home use --Brovana neb BID --Pulmicort neb BID --Nasal cannula during the day, maintain SPO2 greater than 92% --Aggressive pulmonary hygiene   Sepsis 2/2 MSSA bacteremia with MSSA right knee septic arthritis s/p I&D x2 Patient seen by ID on 12/20. Was to complete IV Cefazolin on 12/22 then plan to transition to Cefadroxil continue IV cefazolin while inpatient.  TEE 07/12/2021 negative.  Now with recurrent fever with T-max 100.9 on 08/15/2021. --Infectious disease following, appreciate assistance --Blood cultures 12/21 with no growth x5 days --Repeat blood cultures 12/27 --PICC line removed 12/28 --Daptomycin --Zosyn  Acute metabolic encephalopathy likely multifactorial in the setting of sepsis Baseline issues with anxiety/impulsivity CT Head negative for acute abnormality, Limited MRI negative for acute abnormality. EEG without seizure activity --Depakote 500 mg TID --Klonopin 1 mg BID --Ativan PRN --Avoid fentanyl due to paradoxic agitation   Seizure Like Activity Similar presentation during last admission, Tramadol was previously discontinued due to suspected seizures, it appears that this was restarted as outpatient, now [place]. CT Head shows no acute intracranial abnormality. MRI shows no acute abnormality though limited study. EEG showed no seizure activity. May have been myoclonic activity due to hypoxia. --Depakene 500 mg q8h --Seizure precautions   H/o upper extremity DVT It appears that he was started on Xarelto before around Feb/Mar 2022 for provoked upper extremity DVT. CTA chest neg for PE --Continue Xarelto   Anasarca --net negative 1.5L past 24h, net negative 2.8L since admission --Furosemide 40 mg IV every other day   Diabetes mellitus Last HgbA1c 6.8 on 07/08/21.  Diet controlled at home. --Resistant SSI for  coverage --CBGs qAC/HS  Sinus tachycardia --Metoprolol succinate 100 mg p.o. daily  Morbid obesity Body mass index is 40.15 kg/m.  Discussed with patient needs for aggressive lifestyle changes/weight loss as this complicates all facets of care.  Outpatient follow-up with PCP.  May benefit from bariatric evaluation outpatient.  Weakness/deconditioning/debility: --PT/OT recommending home health on discharge --Continue therapy efforts while inpatient   DVT prophylaxis:  rivaroxaban (XARELTO) tablet 20 mg    Code Status: Full Code Family Communication: Updated spouse present at bedside this morning  Disposition Plan:  Level of care: Stepdown Status is: Inpatient  Remains inpatient appropriate because: Remains on IV antibiotics, removing PICC line today, awaiting further recommendations per ID.    Consultants:  PCCM -signed off 12/27 Infectious disease  Procedures:  Intubated 12/21 Extubated 12/26 PICC line removal 12/28  Antimicrobials:  Daptomycin 12/27>> Zosyn 12/27 Vancomycin 12/21 -12/22 Metronidazole 12/21 - 12/21 Aztreonam 12/21 - 12/21 Cefazolin 12/22 12/27 Levofloxacin 12/27 - 12/27    Subjective: Patient seen examined bedside, resting comfortably.  Lying in bed.  Spouse present.  Continues with some mild anxiety, shortness of breath and diffuse body aches.  Currently on 2 L nasal cannula, wore BiPAP overnight.  Had recurrent fever yesterday, PICC line removed today.  ID escalated antibiotics today.  Awaiting results of repeat blood cultures from yesterday.  Social work working on [employer] for home.  No other questions or concerns at this time.  Denies headache, no chest pain, no palpitations, no chills/night sweats, no nausea/vomiting/diarrhea, no abdominal pain.  No acute events overnight per nursing staff.  Objective: Vitals:   08/16/21 1500 08/16/21 1600 08/16/21 1700 08/16/21 1800  BP: 116/67 128/67 121/64 (!) 113/55  Pulse: (!) 106 (!) 105 (!) 102 (!) 107   Resp: (!) 32 (!) 31 (!) 29 (!) 27  Temp:  99.1 F (37.3 C)    TempSrc:      SpO2: 93% 94% 95% 92%  Weight:      Height:        Intake/Output Summary (Last 24 hours) at 08/16/2021 1308 Last data filed at 08/16/2021 1500 Gross per 24 hour  Intake 600.82 ml  Output 2750 ml  Net -2149.18 ml   Filed Weights   08/14/21 0500 08/15/21 0500 08/16/21 0323  Weight: (!) 136.9 kg (!) 137.2 kg (!) 138 kg    Examination:  General exam: Appears calm and comfortable, obese, chronically ill appearance, appears older than stated age Respiratory system: Breath sounds slightly decreased bilateral bases, normal [place] effort, on 2 L nasal cannula with SPO2 96% at rest. Cardiovascular system: S1 & S2 heard, RRR. No JVD, murmurs, rubs, gallops or clicks. No pedal edema. Gastrointestinal system: Abdomen is nondistended, soft and nontender. No organomegaly or masses felt. Normal bowel sounds heard.  Ostomy noted with brown stool in collection bag. Central nervous system: Alert and oriented. No focal neurological deficits. Extremities: Symmetric 5 x 5 power. Skin: No rashes, lesions or ulcers Psychiatry: Judgement and insight appear normal. Mood & affect appropriate.     Data Reviewed: I have personally reviewed following labs and imaging studies  CBC: Recent Labs  Lab 08/12/21 0618 08/13/21 0430 08/14/21 0345 08/15/21 0500 08/16/21 0521  WBC 5.5 5.1 6.1 8.2 7.0  NEUTROABS  --   --   --   --  4.4  HGB 9.2* 8.7* 8.9* 9.1* 8.7*  HCT 30.9* 29.8* 31.0* 30.7* 30.2*  MCV 93.6 93.4 95.4 91.9 94.1  PLT 183 176 200 250 657   Basic Metabolic Panel: Recent Labs  Lab 08/12/21 0618 08/13/21 0430 08/14/21 0345 08/14/21 1222  08/15/21 0500 08/16/21 0521  NA 140 142 141 143 138 137  K 3.8 2.8* 3.0* 3.6 3.2* 4.0  CL 100 98 96* 95* 92* 91*  CO2 36* 35* 36* 38* 38* 37*  GLUCOSE 164* 183* 153* 102* 91 117*  BUN 11 15 17 13 8 6   CREATININE 0.46* 0.31* 0.43* 0.41* 0.41* 0.32*  CALCIUM 9.2 9.4  9.3 9.6 9.2 9.4  MG 1.7 1.9 1.4* 2.1 1.6* 1.7  PHOS 4.9* 4.6 4.8*  --  5.4* 5.0*   GFR: Estimated Creatinine Clearance: 193.3 mL/min (A) (by C-G formula based on SCr of 0.32 mg/dL (L)). Liver Function Tests: Recent Labs  Lab 08/09/21 2246  AST 57*  ALT 8  ALKPHOS 70  BILITOT 0.9  PROT 7.8  ALBUMIN 3.1*   No results for input(s): LIPASE, AMYLASE in the last 168 hours. No results for input(s): AMMONIA in the last 168 hours. Coagulation Profile: Recent Labs  Lab 08/09/21 2313  INR 1.2   Cardiac Enzymes: Recent Labs  Lab 08/15/21 1255  CKTOTAL 56   BNP (last 3 results) No results for input(s): PROBNP in the last 8760 hours. HbA1C: No results for input(s): HGBA1C in the last 72 hours. CBG: Recent Labs  Lab 08/15/21 2323 08/16/21 0318 08/16/21 0806 08/16/21 1129 08/16/21 1534  GLUCAP 140* 123* 149* 135* 100*   Lipid Profile: Recent Labs    08/14/21 0345  TRIG 268*   Thyroid Function Tests: No results for input(s): TSH, T4TOTAL, FREET4, T3FREE, THYROIDAB in the last 72 hours. Anemia Panel: No results for input(s): VITAMINB12, FOLATE, FERRITIN, TIBC, IRON, RETICCTPCT in the last 72 hours. Sepsis Labs: Recent Labs  Lab 08/09/21 2247 08/09/21 2321 08/10/21 0043 08/10/21 0050 08/10/21 0339 08/15/21 1245  PROCALCITON  --   --   --  0.27  --  0.15  LATICACIDVEN 3.7* 2.4* 2.0*  --  2.0*  --     Recent Results (from the past 240 hour(s))  Blood culture (routine x 2)     Status: None   Collection Time: 08/09/21 10:47 PM   Specimen: BLOOD RIGHT HAND  Result Value Ref Range Status   Specimen Description BLOOD RIGHT HAND  Final   Special Requests   Final    BOTTLES DRAWN AEROBIC AND ANAEROBIC Blood Culture adequate volume   Culture   Final    NO GROWTH 5 DAYS Performed at [facility], [address], [place], [place] [address]    Report Status 08/14/2021 FINAL  Final  Urine Culture     Status: None   Collection Time: 08/09/21 10:47 PM    Specimen: Urine, Catheterized  Result Value Ref Range Status   Specimen Description   Final    URINE, CATHETERIZED Performed at [facility], [address] [place], [place] [address]    Special Requests   Final    NONE Performed at [facility], [address], [place], [place] [address]    Culture   Final    NO GROWTH Performed at [facility], [place] [address] [place], [place] [address]    Report Status 08/11/2021 FINAL  Final  Resp Panel by RT-PCR (Flu A&B, Covid) Nasopharyngeal Swab     Status: None   Collection Time: 08/09/21 10:48 PM   Specimen: Nasopharyngeal Swab; Nasopharyngeal(NP) swabs in vial transport medium  Result Value Ref Range Status   SARS Coronavirus 2 by RT PCR NEGATIVE NEGATIVE Final    Comment: (NOTE) SARS-CoV-2 target nucleic acids are NOT  DETECTED.  The SARS-CoV-2 RNA is generally detectable in upper respiratory specimens during the acute phase of infection. The lowest concentration of SARS-CoV-2 viral copies this assay can detect is 138 copies/mL. A negative result does not preclude SARS-Cov-2 infection and should not be used as the sole basis for treatment or other patient management decisions. A negative result may occur with  improper specimen collection/handling, submission of specimen other than nasopharyngeal swab, presence of viral mutation(s) within the areas targeted by this assay, and inadequate number of viral copies(<138 copies/mL). A negative result must be combined with clinical observations, patient history, and epidemiological information. The expected result is Negative.  Fact Sheet for Patients:  EntrepreneurPulse.com.au  Fact Sheet for Healthcare Providers:  IncredibleEmployment.be  This test is no t yet approved or cleared by the [place] FDA and  has been authorized for detection and/or diagnosis of SARS-CoV-2 by FDA under an Emergency Use Authorization (EUA).  This EUA will remain  in effect (meaning this test can be used) for the duration of the COVID-19 declaration under Section 564(b)(1) of the Act, 21 U.S.C.section 360bbb-3(b)(1), unless the authorization is terminated  or revoked sooner.       Influenza A by PCR NEGATIVE NEGATIVE Final   Influenza B by PCR NEGATIVE NEGATIVE Final    Comment: (NOTE) The Xpert Xpress SARS-CoV-2/FLU/RSV plus assay is intended as an aid in the diagnosis of influenza from Nasopharyngeal swab specimens and should not be used as a sole basis for treatment. Nasal washings and aspirates are unacceptable for Xpert Xpress SARS-CoV-2/FLU/RSV testing.  Fact Sheet for Patients: EntrepreneurPulse.com.au  Fact Sheet for Healthcare Providers: IncredibleEmployment.be  This test is not yet approved or cleared by the [place] FDA and has been authorized for detection and/or diagnosis of SARS-CoV-2 by FDA under an Emergency Use Authorization (EUA). This EUA will remain in effect (meaning this test can be used) for the duration of the COVID-19 declaration under Section 564(b)(1) of the Act, 21 U.S.C. section 360bbb-3(b)(1), unless the authorization is terminated or revoked.  Performed at [facility], [address] [place], [place] [address]   Blood culture (routine x 2)     Status: None   Collection Time: 08/09/21 11:21 PM   Specimen: BLOOD LEFT HAND  Result Value Ref Range Status   Specimen Description BLOOD LEFT HAND  Final   Special Requests   Final    PEDIATRICS Blood Culture results may not be optimal due to an excessive volume of blood received in culture bottles   Culture   Final    NO GROWTH 5 DAYS Performed at [facility], [address] [place], [place] [address]    Report Status 08/14/2021 FINAL  Final  CULTURE, BLOOD (ROUTINE X 2) w Reflex to ID Panel     Status: None (Preliminary result)   Collection Time: 08/15/21 12:45 PM    Specimen: BLOOD  Result Value Ref Range Status   Specimen Description BLOOD LEFT HAND  Final   Special Requests   Final    BOTTLES DRAWN AEROBIC AND ANAEROBIC Blood Culture adequate volume   Culture   Final    NO GROWTH < 24 HOURS Performed at [facility], [address] [place], [place] [address]    Report Status PENDING  Incomplete  CULTURE, BLOOD (ROUTINE X 2) w Reflex to ID Panel     Status: None (Preliminary result)   Collection Time: 08/15/21 12:46 PM   Specimen: BLOOD  Result Value Ref Range Status  Specimen Description BLOOD RIGHT HAND  Final   Special Requests   Final    BOTTLES DRAWN AEROBIC AND ANAEROBIC Blood Culture adequate volume   Culture   Final    NO GROWTH < 24 HOURS Performed at [facility], [address], [place], [place] [address]    Report Status PENDING  Incomplete  Resp Panel by RT-PCR (Flu A&B, Covid) Nasopharyngeal Swab     Status: None   Collection Time: 08/16/21  4:18 PM   Specimen: Nasopharyngeal Swab; Nasopharyngeal(NP) swabs in vial transport medium  Result Value Ref Range Status   SARS Coronavirus 2 by RT PCR NEGATIVE NEGATIVE Final    Comment: (NOTE) SARS-CoV-2 target nucleic acids are NOT DETECTED.  The SARS-CoV-2 RNA is generally detectable in upper respiratory specimens during the acute phase of infection. The lowest concentration of SARS-CoV-2 viral copies this assay can detect is 138 copies/mL. A negative result does not preclude SARS-Cov-2 infection and should not be used as the sole basis for treatment or other patient management decisions. A negative result may occur with  improper specimen collection/handling, submission of specimen other than nasopharyngeal swab, presence of viral mutation(s) within the areas targeted by this assay, and inadequate number of viral copies(<138 copies/mL). A negative result must be combined with clinical observations, patient history, and epidemiological information. The  expected result is Negative.  Fact Sheet for Patients:  EntrepreneurPulse.com.au  Fact Sheet for Healthcare Providers:  IncredibleEmployment.be  This test is no t yet approved or cleared by the [place] FDA and  has been authorized for detection and/or diagnosis of SARS-CoV-2 by FDA under an Emergency Use Authorization (EUA). This EUA will remain  in effect (meaning this test can be used) for the duration of the COVID-19 declaration under Section 564(b)(1) of the Act, 21 U.S.C.section 360bbb-3(b)(1), unless the authorization is terminated  or revoked sooner.       Influenza A by PCR NEGATIVE NEGATIVE Final   Influenza B by PCR NEGATIVE NEGATIVE Final    Comment: (NOTE) The Xpert Xpress SARS-CoV-2/FLU/RSV plus assay is intended as an aid in the diagnosis of influenza from Nasopharyngeal swab specimens and should not be used as a sole basis for treatment. Nasal washings and aspirates are unacceptable for Xpert Xpress SARS-CoV-2/FLU/RSV testing.  Fact Sheet for Patients: EntrepreneurPulse.com.au  Fact Sheet for Healthcare Providers: IncredibleEmployment.be  This test is not yet approved or cleared by the [place] FDA and has been authorized for detection and/or diagnosis of SARS-CoV-2 by FDA under an Emergency Use Authorization (EUA). This EUA will remain in effect (meaning this test can be used) for the duration of the COVID-19 declaration under Section 564(b)(1) of the Act, 21 U.S.C. section 360bbb-3(b)(1), unless the authorization is terminated or revoked.  Performed at [facility], [address] [place], [place] [address]          Radiology Studies: DG Chest [place] 1 View  Result Date: 08/15/2021 CLINICAL DATA:  Difficulty breathing, fever EXAM: PORTABLE CHEST 1 VIEW COMPARISON:  Previous studies including the examination of 08/10/2021 FINDINGS: Transverse diameter of  heart is increased. Increased interstitial markings are seen in right parahilar region and both lower lung fields. There is slight improvement in aeration of parahilar regions. There is interval removal of endotracheal tube. Distal portion of right PICC line appears to be coiled in the area of right subclavian vein. IMPRESSION: Cardiomegaly. Increased interstitial markings are seen in the parahilar regions and lower lung fields suggesting possible interstitial pneumonitis. No new  focal infiltrates are seen. There is no significant pleural effusion or pneumothorax. Distal portion of PICC line introduced through the right upper extremity appears to be coiled in the area of right subclavian vein. Electronically Signed   By: Elmer Picker M.D.   On: 08/15/2021 13:16        Scheduled Meds:  (feeding supplement) PROSource Plus  30 mL Oral TID BM   arformoterol  15 mcg Nebulization BID   vitamin C  500 mg Oral BID   budesonide (PULMICORT) nebulizer solution  0.25 mg Nebulization BID   Chlorhexidine Gluconate Cloth  6 each Topical Daily   clonazePAM  1 mg Per Tube BID   docusate  100 mg Per Tube BID   DULoxetine  60 mg Oral Daily   feeding supplement  1 Container Oral TID BM   [START ON 08/17/2021] furosemide  40 mg Intravenous QODAY   insulin aspart  0-20 Units Subcutaneous Q4H   lidocaine  1 patch Transdermal Q24H   mouth rinse  15 mL Mouth Rinse BID   metoprolol succinate  100 mg Oral Daily   multivitamin with minerals  1 tablet Oral Daily   pantoprazole (PROTONIX) IV  40 mg Intravenous QHS   polyethylene glycol  17 g Per Tube Daily   QUEtiapine  25 mg Oral QHS   rivaroxaban  20 mg Per Tube Daily   valproic acid  500 mg Per Tube TID   Continuous Infusions:  DAPTOmycin (CUBICIN)  IV 800 mg (08/16/21 0220)   piperacillin-tazobactam (ZOSYN)  IV 3.375 g (08/16/21 1611)     LOS: 6 days    Time spent: 49 minutes spent on chart review, discussion with nursing staff, consultants,  updating family and interview/physical exam; more than 50% of that time was spent in counseling and/or coordination of care.    Rut Betterton J [place] DO [facility] Available via Epic secure chat 7am-7pm After these hours, please refer to coverage provider listed on [email or web address] 08/16/2021, 6:22 PM

## 2021-08-16 NOTE — Progress Notes (Signed)
Attempted knee aspiration twice, no fluid obtained.

## 2021-08-16 NOTE — TOC Progression Note (Signed)
Transition of Care [facility]) - Progression Note    Patient Details  Name: Craig [name] MRN: [id number] Date of Birth: 04/28/1991  Transition of [facility] CM/SW Contact  Shelbie Hutching, RN Phone Number: 08/16/2021, 12:35 PM  Clinical Narrative:    [employer] is working on insurance authorization for NIV, ID is following.  Per ID last note looks as though patient can discharge on oral antibiotics.   TOC will cont to follow.     Expected Discharge Plan:  (TBD) Barriers to Discharge: Continued Medical Work up  Expected Discharge Plan and Services Expected Discharge Plan:  (TBD)                                               Social Determinants of Health (SDOH) Interventions    Readmission Risk Interventions Readmission Risk Prevention Plan 07/10/2021  Transportation Screening Complete  PCP or Specialist Appt within 3-5 Days Complete  HRI or [place] Complete  Social Work Consult for [place] Planning/Counseling Complete  Palliative Care Screening Not Applicable  Medication Review Press photographer) Complete  Some recent data might be hidden

## 2021-08-16 NOTE — Progress Notes (Signed)
Date of Admission:  08/09/2021     ID: Craig [name] is a 30 y.o. male  Principal Problem:   Acute on chronic respiratory failure with hypoxia and hypercapnia (HCC)    Subjective: Pt awake and alert Still short of breath Says body hurts Pain joints and muscles Low grade fever  Medications:   (feeding supplement) PROSource Plus  30 mL Oral TID BM   arformoterol  15 mcg Nebulization BID   vitamin C  500 mg Oral BID   budesonide (PULMICORT) nebulizer solution  0.25 mg Nebulization BID   Chlorhexidine Gluconate Cloth  6 each Topical Daily   clonazePAM  1 mg Per Tube BID   docusate  100 mg Per Tube BID   DULoxetine  60 mg Oral Daily   feeding supplement  1 Container Oral TID BM   [START ON 08/17/2021] furosemide  40 mg Intravenous QODAY   insulin aspart  0-20 Units Subcutaneous Q4H   lidocaine  1 patch Transdermal Q24H   mouth rinse  15 mL Mouth Rinse BID   metoprolol succinate  100 mg Oral Daily   multivitamin with minerals  1 tablet Oral Daily   pantoprazole (PROTONIX) IV  40 mg Intravenous QHS   polyethylene glycol  17 g Per Tube Daily   potassium chloride  40 mEq Oral BID   QUEtiapine  25 mg Oral QHS   rivaroxaban  20 mg Per Tube Daily   valproic acid  500 mg Per Tube TID    Objective: Vital signs in last 24 hours: Patient Vitals for the past 24 hrs:  BP Temp Temp src Pulse Resp SpO2 Weight  08/16/21 1400 116/60 -- -- (!) 111 (!) 29 95 % --  08/16/21 1300 127/66 -- -- (!) 110 (!) 28 97 % --  08/16/21 1200 121/64 -- -- (!) 116 20 97 % --  08/16/21 1100 (!) 100/57 99.1 F (37.3 C) -- (!) 121 (!) 29 94 % --  08/16/21 0600 (!) 137/58 -- -- (!) 115 (!) 28 96 % --  08/16/21 0500 (!) 126/53 99.8 F (37.7 C) -- (!) 109 (!) 27 100 % --  08/16/21 0409 -- -- -- (!) 112 -- 100 % --  08/16/21 0400 (!) 117/56 -- -- (!) 110 (!) 22 100 % --  08/16/21 0323 -- -- -- -- -- -- (!) 138 kg  08/16/21 0300 (!) 113/56 -- -- (!) 111 19 100 % --  08/16/21 0200 (!) 116/52 -- --  (!) 111 (!) 25 93 % --  08/16/21 0100 (!) 122/56 -- -- (!) 110 (!) 25 96 % --  08/16/21 0000 (!) 122/54 99.8 F (37.7 C) Oral (!) 107 (!) 25 96 % --  08/15/21 2300 (!) 123/55 -- -- (!) 113 (!) 25 94 % --  08/15/21 2200 (!) 134/52 -- -- (!) 122 (!) 25 92 % --  08/15/21 2100 126/64 -- -- (!) 123 (!) 30 94 % --  08/15/21 2000 (!) 143/58 -- -- (!) 124 (!) 31 97 % --  08/15/21 1924 -- -- -- (!) 127 (!) 33 98 % --  08/15/21 1900 139/72 99.4 F (37.4 C) Oral (!) 127 (!) 27 95 % --  08/15/21 1700 125/61 -- -- (!) 123 (!) 23 95 % --  08/15/21 1600 139/90 -- -- (!) 136 -- 99 % --     PHYSICAL EXAM:  General: awake, alert,  Lungs: b/l air entry- rhonchi few Heart: tachycardia Abdomen: Soft, colostomy Extremities: edematous Rt  picc removed Foley Swelling of rt knee- some warmth  Neurologic: unable to assess   Lab Results Recent Labs    08/15/21 0500 08/16/21 0521  WBC 8.2 7.0  HGB 9.1* 8.7*  HCT 30.7* 30.2*  NA 138 137  K 3.2* 4.0  CL 92* 91*  CO2 38* 37*  BUN 8 6  CREATININE 0.41* 0.32*    Studies/Results: DG Chest Port 1 View  Result Date: 08/15/2021 CLINICAL DATA:  Difficulty breathing, fever EXAM: PORTABLE CHEST 1 VIEW COMPARISON:  Previous studies including the examination of 08/10/2021 FINDINGS: Transverse diameter of heart is increased. Increased interstitial markings are seen in right parahilar region and both lower lung fields. There is slight improvement in aeration of parahilar regions. There is interval removal of endotracheal tube. Distal portion of right PICC line appears to be coiled in the area of right subclavian vein. IMPRESSION: Cardiomegaly. Increased interstitial markings are seen in the parahilar regions and lower lung fields suggesting possible interstitial pneumonitis. No new focal infiltrates are seen. There is no significant pleural effusion or pneumothorax. Distal portion of PICC line introduced through the right upper extremity appears to be coiled in  the area of right subclavian vein. Electronically Signed   By: Elmer Picker M.D.   On: 08/15/2021 13:16     Assessment/Plan: Acute hypoxic and hypercarbic resp falilure on chronic respiratory failure- this episode precipitated by anxiety Pt has underlying OSA but has never been assessed and given CPAP He has been extubated More sob    New fever- has PICC line for 4 weeks and it is coiled- it needs to be removed Started on zosyn And dapto Will check covid and flu and resp   Seizures- could be related to the acute hypoxia.  EEG done - no seizure activity noted   COVID respiratory illness in 2021 leading to Underlying atelectasis and ILD? need for oxygen /   Recent MSSA bacteremia and Rt septic knee S/p I/D and also received 4 weeks of IV cefazolin- He will need Po for 4 more weeks-   Anemia   Hypokalemia  Discussed the management with the patient, wife and his nurse

## 2021-08-16 NOTE — Progress Notes (Signed)
Nutrition Follow-up  DOCUMENTATION CODES:   Morbid obesity  INTERVENTION:   -D/c Nepro -Boost Breeze po TID, each supplement provides 250 kcal and 9 grams of protein  -30 ml Prosource Plus TID, each supplement provides 100 kcals and 15 grams protein -MVI with minerals daily -Magic cup TID with meals, each supplement provides 290 kcal and 9 grams of protein   NUTRITION DIAGNOSIS:   Inadequate oral intake related to inability to eat as evidenced by NPO status.  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  12/26- extubated  Reviewed I/O's: -637 ml x 24 hours and -1.3 L since admission  UOP: 2.1 L x 24 hours   Pt sleeping soundly at time of visit.   Pt currently on a regular diet. Noted poor meal completions, documented at 25-50%. Pt is refusing Nepro supplements; RD will add Boost Breeze, as he has preferred this supplement in the past.   Pt has experienced a 2.9% wt loss since admission. Noted pt is -1.3 L since admission.   Medications reviewed and include colace, vitamin C, lasix, and miralax.   Labs reviewed: CBGS: 97-149  (inpatient orders for glycemic control are 0-20 units inuslin aspart every 4 hours).    Diet Order:   Diet Order             Diet regular Room service appropriate? Yes; Fluid consistency: Thin  Diet effective now                   EDUCATION NEEDS:   Not appropriate for education at this time  Skin:  Skin Integrity Issues:: Stage II, Incisions Stage II: coccyx Incisions: closed rt knee  Last BM:  08/15/21 (450 ml via colostomy)  Height:   Ht Readings from Last 1 Encounters:  08/14/21 6' 0.99" (1.854 m)    Weight:   Wt Readings from Last 1 Encounters:  08/16/21 (!) 138 kg    Ideal Body Weight:  83.6 kg  BMI:  Body mass index is 40.15 kg/m.  Estimated Nutritional Needs:   Kcal:  2300-2500  Protein:  125-150 grams  Fluid:  > 2 L    Loistine Chance, RD, LDN, [place] Registered Dietitian II Certified Diabetes Care and Education Specialist Please refer to [facility] for RD and/or RD on-call/weekend/after hours pager

## 2021-08-17 DIAGNOSIS — J9622 Acute and chronic respiratory failure with hypercapnia: Secondary | ICD-10-CM | POA: Diagnosis not present

## 2021-08-17 DIAGNOSIS — J9621 Acute and chronic respiratory failure with hypoxia: Secondary | ICD-10-CM | POA: Diagnosis not present

## 2021-08-17 LAB — GLUCOSE, CAPILLARY
Glucose-Capillary: 138 mg/dL — ABNORMAL HIGH (ref 70–99)
Glucose-Capillary: 123 mg/dL — ABNORMAL HIGH (ref 70–99)
Glucose-Capillary: 171 mg/dL — ABNORMAL HIGH (ref 70–99)
Glucose-Capillary: 195 mg/dL — ABNORMAL HIGH (ref 70–99)
Glucose-Capillary: 165 mg/dL — ABNORMAL HIGH (ref 70–99)
Glucose-Capillary: 147 mg/dL — ABNORMAL HIGH (ref 70–99)

## 2021-08-17 LAB — CBC
HCT: 30 % — ABNORMAL LOW (ref 39.0–52.0)
Hemoglobin: 8.7 g/dL — ABNORMAL LOW (ref 13.0–17.0)
MCH: 27.4 pg (ref 26.0–34.0)
MCHC: 29 g/dL — ABNORMAL LOW (ref 30.0–36.0)
MCV: 94.3 fL (ref 80.0–100.0)
Platelets: 279 10*3/uL (ref 150–400)
RBC: 3.18 MIL/uL — ABNORMAL LOW (ref 4.22–5.81)
RDW: 17.3 % — ABNORMAL HIGH (ref 11.5–15.5)
WBC: 9 10*3/uL (ref 4.0–10.5)
nRBC: 0 % (ref 0.0–0.2)

## 2021-08-17 LAB — BASIC METABOLIC PANEL
Anion gap: 10 (ref 5–15)
BUN: <5 mg/dL — ABNORMAL LOW (ref 6–20)
CO2: 35 mmol/L — ABNORMAL HIGH (ref 22–32)
Calcium: 9.7 mg/dL (ref 8.9–10.3)
Chloride: 92 mmol/L — ABNORMAL LOW (ref 98–111)
Creatinine, Ser: 0.42 mg/dL — ABNORMAL LOW (ref 0.61–1.24)
GFR, Estimated: >60 mL/min (ref >60)
Glucose, Bld: 135 mg/dL — ABNORMAL HIGH (ref 70–99)
Potassium: 5 mmol/L (ref 3.5–5.1)
Sodium: 137 mmol/L (ref 135–145)

## 2021-08-17 LAB — FUNGUS CULTURE RESULT

## 2021-08-17 LAB — FUNGAL ORGANISM REFLEX

## 2021-08-17 LAB — FUNGUS CULTURE WITH STAIN

## 2021-08-17 LAB — MAGNESIUM: Magnesium: 1.5 mg/dL — ABNORMAL LOW (ref 1.7–2.4)

## 2021-08-17 MED ORDER — CLONAZEPAM 1 MG PO TABS: 1.0000 mg | ORAL_TABLET | Freq: Two times a day (BID) | ORAL | 2 refills | Status: DC

## 2021-08-17 MED ORDER — CEFADROXIL 1 G PO TABS: 1.0000 g | ORAL_TABLET | Freq: Two times a day (BID) | ORAL | 0 refills | Status: DC

## 2021-08-17 MED ORDER — MAGNESIUM SULFATE 4 GM/100ML IV SOLN
4.0000 g | Freq: Once | INTRAVENOUS | Status: AC
Start: 2021-08-17 — End: 2021-08-17
  Administered 2021-08-17: 10:00:00 4 g via INTRAVENOUS
  Filled 2021-08-17: qty 100

## 2021-08-17 MED ORDER — VALPROIC ACID 250 MG PO CAPS: 500.0000 mg | ORAL_CAPSULE | Freq: Three times a day (TID) | ORAL | 2 refills | Status: DC

## 2021-08-17 NOTE — Discharge Summary (Addendum)
Physician Discharge Summary  Craig [name] [id number]:[id number] DOB: 06/10/1991 DOA: 08/09/2021  PCP: [name], MD  Admit date: 08/09/2021 Discharge date: 08/17/2021  Admitted From: Home Disposition: Home  Recommendations for Outpatient Follow-up:  Follow up with PCP in 1-2 weeks Follow-up with infectious disease, Dr. [name] as scheduled Discharging on cefadroxil 1 g p.o. twice daily for 4 weeks per ID recommendations Follow-up blood cultures from 12/27 which were not finalized at time of discharge Outpatient palliative care the following discharge  Home Health: PT/OT, outpatient palliative care to follow on discharge Equipment/Devices: Noninvasive ventilator, oxygen 2 L per nasal cannula  Discharge Condition: Stable CODE STATUS: Full code Diet recommendation: Heart healthy/consistent carb regular diet  History of present illness:  Craig [name] is a 30 year old male with past medical history significant for inflammatory arthritis on methotrexate and chronic steroids, history of severe COVID-19 viral infection/pneumonia requiring prolonged hospitalization and tracheostomy since decannulated, chronic hypoxic/hypercapnic respiratory failure on 2 L nasal cannula at baseline, history of MSSA bacteremia and pneumonia, 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  On arrival to the ED, patient was being bagged by EMS to maintain SPO2 in the low 90s.  He was noted to have an elevated WBC count of 22, tachypneic, tachycardic and hypoxia with concern for sepsis secondary to pneumonia in the setting of seizure activity.  Patient was started on empiric antibiotics and intubated by EDP for severe hypoxic/hypercarbic respite failure in the setting of obesity, OHS,  severe deconditioning..  Patient was initially admitted to the critical care service.  Patient was extubated on 08/14/2021.  Patient was transferred to hospital service on 12/27.  Hospital course:  Acute on chronic hypoxic and hypercarbic respiratory failure, POA Morbid obesity with obesity hypoventilation, severe deconditioning Patient with history of severe Covid-19 pneumonia initially requiring tracheostomy which has subsequently been decannulated, MSSA pneumonia, Morbid obesity, probable OHS/OSA and acute worsening likely secondary to multifactorial etiology from sepsis, OSA, OHS.  Was initially admitted by [facility] service after being intubated by ED physician.  Was successfully extubated on 08/14/2021 to BiPAP.  Social work through [facility] obtaining noninvasive ventilator.  Continue nebs at home.  Oxygen during the day.   Sepsis 2/2 MSSA bacteremia with MSSA right knee septic arthritis s/p I&D x2 Patient seen by ID on 12/20. Was to complete IV Cefazolin on 12/22 then plan to transition to Cefadroxil continue IV cefazolin while inpatient.  TEE 07/12/2021 negative.  PICC line removed 12/28.  Repeated blood cultures 12/27 with no growth at time of discharge, will need to follow-up finalized blood cultures after discharge.  Discharging home on cefadroxil 1 g p.o. twice daily for 4 additional weeks per ID recommendations.  Outpatient follow-up with infectious disease.   Acute metabolic encephalopathy likely multifactorial in the setting of sepsis Baseline issues with anxiety/impulsivity CT Head negative for acute abnormality, Limited MRI negative for acute abnormality. EEG without seizure activity.  Continue Depakote 500 mg p.o. 3 times daily.   Seizure Like Activity Similar presentation during last admission, Tramadol was previously discontinued due to suspected seizures, it appears that this was restarted as outpatient, now [place]. CT Head shows no acute intracranial abnormality. MRI shows no acute  abnormality though limited study. EEG showed no seizure activity. May have been myoclonic activity due to hypoxia.  Continue Depakote 500 mg q8h  and clonazepam 1 mg p.o. twice daily.   H/o upper extremity DVT It appears that he was started on Xarelto before around Feb/Mar 2022 for provoked upper extremity DVT. CTA chest neg for PE. Continue Xarelto    Anasarca Continue home furosemide and acetazolamide   Diabetes mellitus Last HgbA1c 6.8 on 07/08/21.  Diet controlled at home.   Sinus tachycardia Metoprolol succinate 100 mg p.o. daily   Morbid obesity Body mass index is 40.15 kg/m.  Discussed with patient needs for aggressive lifestyle changes/weight loss as this complicates all facets of care.  Outpatient follow-up with PCP.  May benefit from bariatric evaluation outpatient.   Weakness/deconditioning/debility: Discharging with home health PT/OT.    Discharge Diagnoses:  Principal Problem:   Acute on chronic respiratory failure with hypoxia and hypercapnia [facility])    Discharge Instructions  Discharge Instructions     Call MD for:  difficulty breathing, headache or visual disturbances   Complete by: As directed    Call MD for:  extreme fatigue   Complete by: As directed    Call MD for:  persistant dizziness or light-headedness   Complete by: As directed    Call MD for:  persistant nausea and vomiting   Complete by: As directed    Call MD for:  severe uncontrolled pain   Complete by: As directed    Call MD for:  temperature >100.4   Complete by: As directed    Diet - low sodium heart healthy   Complete by: As directed    Increase activity slowly   Complete by: As directed    No wound care   Complete by: As directed       Allergies as of 08/17/2021       Reactions   Tramadol Hcl    SEIZURES!!! DO NOT REORDER AT DISCHARGE!!!   Fentanyl Citrate Other (See Comments)   Makes patient more agitated & delirious   Amoxicillin Rash   Tolerated cefepime and cefazolin  08/2020.        Medication List     STOP taking these medications    clonazePAM 1 MG tablet Commonly known as: KLONOPIN   insulin glargine 100 UNIT/ML Solostar Pen Commonly known as: LANTUS   Potassium Chloride ER 20 MEQ Tbcr       TAKE these medications    acetaminophen 650 MG CR tablet Commonly known as: TYLENOL Take 650 mg by mouth every 8 (eight) hours as needed.   acetaZOLAMIDE 250 MG tablet Commonly known as: DIAMOX Take 1 tablet (250 mg total) by mouth 2 (two) times daily.   albuterol 1.25 MG/3ML nebulizer solution Commonly known as: ACCUNEB Take 1 ampule by nebulization 3 (three) times daily as needed.   allopurinol 300 MG tablet Commonly known as: ZYLOPRIM Take 300 mg by mouth daily.   ascorbic acid 500 MG tablet Commonly known as: VITAMIN C Take 1 tablet (500 mg total) by mouth 2 (two) times daily.   blood glucose meter kit and supplies Kit Dispense based on patient and insurance preference. Use up to four times daily as directed.   budesonide 0.25 MG/2ML nebulizer solution Commonly known as: PULMICORT Take 0.25 mg by nebulization daily.   cefadroxil 1 g tablet Commonly known as: DURICEF Take 1 tablet (1 g total) by mouth 2 (two) times daily for 28 days.   cetirizine 10 MG tablet Commonly known as: ZYRTEC Take 10 mg by mouth daily.   [employer] Saline Nasal Spray 0.65 % nasal spray Generic drug: sodium  chloride SMARTSIG:1 Spray(s) Both Nares Every 4 Hours PRN   DULoxetine 60 MG capsule Commonly known as: CYMBALTA Take 60 mg by mouth daily.   famotidine 20 MG tablet Commonly known as: PEPCID Take 20 mg by mouth 2 (two) times daily.   Fish Oil 1000 MG Caps Take 1 capsule by mouth daily.   folic acid 1 MG tablet Commonly known as: FOLVITE Take 1 mg by mouth daily.   furosemide 40 MG tablet Commonly known as: LASIX Take 1 tablet (40 mg total) by mouth 2 (two) times daily. What changed: when to take this   insulin aspart 100 UNIT/ML  injection Commonly known as: novoLOG Inject 10 Units into the skin 3 (three) times daily with meals.   lidocaine 5 % Commonly known as: LIDODERM 1 patch daily as needed.   melatonin 3 MG Tabs tablet Take 6 mg by mouth at bedtime as needed.   metoprolol succinate 100 MG 24 hr tablet Commonly known as: TOPROL-XL Take 100 mg by mouth daily.   multivitamin with minerals tablet Take 1 tablet by mouth daily.   nicotine 21 mg/24hr patch Commonly known as: NICODERM CQ - dosed in mg/24 hours Place 1 patch (21 mg total) onto the skin daily.   nystatin powder Commonly known as: MYCOSTATIN/NYSTOP Apply topically 3 (three) times daily.   ondansetron 4 MG tablet Commonly known as: ZOFRAN Take 4 mg by mouth every 8 (eight) hours as needed.   Oxycodone HCl 10 MG Tabs Take 0.5-1 tablets (5-10 mg total) by mouth every 6 (six) hours as needed for moderate pain or severe pain.   polyethylene glycol 17 g packet Commonly known as: MIRALAX / GLYCOLAX Take 17 g by mouth daily.   pregabalin 200 MG capsule Commonly known as: LYRICA Take 200 mg by mouth 2 (two) times daily.   rivaroxaban 20 MG Tabs tablet Commonly known as: XARELTO Take 20 mg by mouth daily with supper.   valproic acid 250 MG capsule Commonly known as: Depakene Take 2 capsules (500 mg total) by mouth every 8 (eight) hours.   Vitamin D3 25 MCG (1000 UT) Caps Take 1 capsule by mouth daily.        Follow-up Information     [name], MD. Schedule an appointment as soon as possible for a visit in 1 week(s).   Specialty: Internal Medicine Contact information: [place] [address] [phone]         Tsosie Billing, MD. Schedule an appointment as soon as possible for a visit in 2 week(s).   Specialty: Infectious Diseases Contact information: [place] [address] [phone]                Allergies  Allergen  Reactions   Tramadol Hcl     SEIZURES!!! DO NOT REORDER AT DISCHARGE!!!   Fentanyl Citrate Other (See Comments)    Makes patient more agitated & delirious   Amoxicillin Rash    Tolerated cefepime and cefazolin 08/2020.    Consultations: PCCM Infectious disease   Procedures/Studies: DG Abd 1 View  Result Date: 08/10/2021 CLINICAL DATA:  OG tube placement EXAM: ABDOMEN - 1 VIEW COMPARISON:  07/08/2021 FINDINGS: OG tube tip is in the distal stomach. Paucity of gas throughout the abdomen. IMPRESSION: OG tube in the distal stomach Electronically Signed   By: Rolm Baptise M.D.   On: 08/10/2021 07:00   CT HEAD WO CONTRAST (5MM)  Result Date: 08/10/2021  CLINICAL DATA:  Status post seizure. EXAM: CT HEAD WITHOUT CONTRAST TECHNIQUE: Contiguous axial images were obtained from the base of the skull through the vertex without intravenous contrast. COMPARISON:  July 08, 2021 FINDINGS: Brain: No evidence of acute infarction, hemorrhage, hydrocephalus, extra-axial collection or mass lesion/mass effect. Vascular: No hyperdense vessel or unexpected calcification. Skull: Normal. Negative for fracture or focal lesion. Sinuses/Orbits: There is mild bilateral ethmoid sinus and marked severity sphenoid sinus mucosal thickening Other: None. IMPRESSION: 1. No acute intracranial pathology. 2. Mild bilateral ethmoid sinus and marked severity sphenoid sinus disease. Electronically Signed   By: Virgina Norfolk M.D.   On: 08/10/2021 01:26   CT Angio Chest PE W and/or Wo Contrast  Result Date: 08/10/2021 CLINICAL DATA:  Shortness of breath. EXAM: CT ANGIOGRAPHY CHEST WITH CONTRAST TECHNIQUE: Multidetector CT imaging of the chest was performed using the standard protocol during bolus administration of intravenous contrast. Multiplanar CT image reconstructions and MIPs were obtained to evaluate the vascular anatomy. CONTRAST:  113m OMNIPAQUE IOHEXOL 350 MG/ML SOLN COMPARISON:  July 08, 2021 FINDINGS:  Cardiovascular: The subsegmental pulmonary arteries are limited in evaluation secondary to overlying beam hardening artifact. No evidence of pulmonary embolism. Normal heart size. No pericardial effusion. Mediastinum/Nodes: No enlarged mediastinal, hilar, or axillary lymph nodes. Thyroid gland, trachea, and esophagus demonstrate no significant findings. Lungs/Pleura: Endotracheal tube is seen. Stable, moderate to marked severity consolidative atelectatic changes are seen within the bilateral upper lobes and left lower lobe. There is no evidence of a pleural effusion or pneumothorax. Upper Abdomen: There is stable elevation of the right hemidiaphragm. Diffuse fatty infiltration of the liver parenchyma is noted. Musculoskeletal: No chest wall abnormality. No acute or significant osseous findings. Review of the MIP images confirms the above findings. IMPRESSION: 1. No evidence of pulmonary embolism. 2. Stable, moderate to marked severity bilateral upper lobe and left lower lobe consolidative atelectatic changes. 3. Hepatic steatosis. Electronically Signed   By: TVirgina NorfolkM.D.   On: 08/10/2021 01:29   MR BRAIN WO CONTRAST  Result Date: 08/11/2021 CLINICAL DATA:  Meningitis/CNS infection suspected. Rule out septic emboli. EXAM: MRI HEAD WITHOUT CONTRAST TECHNIQUE: Multiplanar, multiecho pulse sequences of the brain and surrounding structures were obtained without intravenous contrast. COMPARISON:  CT head 08/10/2021 FINDINGS: Brain: Incomplete study. The patient became hypotensive early in the study and the study was terminated by the nurse supervising. Diffusion-weighted imaging is diagnostic. No acute infarct identified. Ventricle size normal. No fluid collection or areas of restricted diffusion. Mucosal edema of the sphenoid sinus. No focal skeletal lesion. IMPRESSION: Incomplete study due to episode of hypotension during the scan Diffusion-weighted imaging demonstrates no acute infarct. Electronically  Signed   By: CFranchot GalloM.D.   On: 08/11/2021 14:45   DG Chest Port 1 View  Result Date: 08/15/2021 CLINICAL DATA:  Difficulty breathing, fever EXAM: PORTABLE CHEST 1 VIEW COMPARISON:  Previous studies including the examination of 08/10/2021 FINDINGS: Transverse diameter of heart is increased. Increased interstitial markings are seen in right parahilar region and both lower lung fields. There is slight improvement in aeration of parahilar regions. There is interval removal of endotracheal tube. Distal portion of right PICC line appears to be coiled in the area of right subclavian vein. IMPRESSION: Cardiomegaly. Increased interstitial markings are seen in the parahilar regions and lower lung fields suggesting possible interstitial pneumonitis. No new focal infiltrates are seen. There is no significant pleural effusion or pneumothorax. Distal portion of PICC line introduced through the right upper extremity appears to  be coiled in the area of right subclavian vein. Electronically Signed   By: Elmer Picker M.D.   On: 08/15/2021 13:16   DG Chest Portable 1 View  Result Date: 08/09/2021 CLINICAL DATA:  Shortness of breath check endotracheal tube placement EXAM: PORTABLE CHEST 1 VIEW COMPARISON:  07/21/2021 FINDINGS: Endotracheal tube is noted in satisfactory position above the carina. Cardiac shadow is stable. Vascular congestion is noted. Right-sided PICC line is seen at the cavoatrial junction. No bony abnormality is noted. IMPRESSION: Vascular congestion. Tubes and lines as described. Electronically Signed   By: Inez Catalina M.D.   On: 08/09/2021 23:20   DG Chest Port 1 View  Result Date: 07/21/2021 CLINICAL DATA:  History of prior COVID infection with persistent shortness of breath, initial encounter EXAM: PORTABLE CHEST 1 VIEW COMPARISON:  07/13/2021 FINDINGS: Cardiac shadow remains enlarged. Elevation of the right hemidiaphragm is again seen. Stable airspace opacities are noted bilaterally  similar to that seen on the prior exam. No new focal abnormality is noted. No bony abnormality is seen. IMPRESSION: Stable airspace opacities which may be sequelae from the prior COVID infection. Electronically Signed   By: Inez Catalina M.D.   On: 07/21/2021 19:20   EEG adult  Result Date: 08/11/2021 Derek Jack, MD     08/11/2021 10:44 AM Routine EEG Report ELVYN KROHN is a 30 y.o. male with a history of encephalopathy who is undergoing an EEG to evaluate for seizures. Report: This EEG was acquired with electrodes placed according to the International 10-20 electrode system (including Fp1, Fp2, F3, F4, C3, C4, P3, P4, O1, O2, T3, T4, T5, T6, A1, A2, Fz, Cz, Pz). The following electrodes were missing or displaced: none. The best background was 8.5 Hz. Later in the recording there are some intervening periods of relative suppression lasting 1-2 seconds. This activity is reactive to stimulation. There was no sleep architecture identified. There is right frontal focal slowing. There are lateralized periodic discharges in the right frontal region q 4 seconds that abate towards the end of the recording. Photic stimulation and hyperventilation were not performed. Impression and clinical correlation: This EEG was obtained while sedated and is abnormal due to: - Lateralized periodic discharges in the right frontal region indicating increased epileptogenic potential in that region - Right focal slowing indicating focal cerebral dysfunction in that region - Brief periods of relative suppression likely indicating medication effect There were no electrographic seizures seen in this recording. [name], MD [facility] [phone] If 7pm- 7am, please page neurology on call as listed in [place].   [place] LT UPPER EXTREM LTD SOFT TISSUE NON VASCULAR  Result Date: 07/20/2021 CLINICAL DATA:  30 year old male with possible left shoulder septic arthritis. EXAM: ULTRASOUND LEFT UPPER EXTREMITY LIMITED  TECHNIQUE: Ultrasound examination of the upper extremity soft tissues was performed in the area of clinical concern. COMPARISON:  None. FINDINGS: Ultrasound evaluation of the left shoulder glenohumeral joint demonstrates no evidence of significant joint effusion. IMPRESSION: No sonographic evidence of left shoulder effusion. Electronically Signed   By: Ruthann Cancer M.D.   On: 07/20/2021 16:37   [place] EKG SITE RITE  Result Date: 07/21/2021 If Site Rite image not attached, placement could not be confirmed due to current cardiac rhythm.    Subjective: Patient seen examined bedside, resting comfortably.  Sleeping but easily arousable.  Spouse present at bedside.  Ready for discharge home.  ID recommends 4 weeks of cefadroxil and social work obtaining noninvasive ventilator from [facility] for home  use.  No other questions or concerns at this time.  Denies headache, no dizziness, no chest pain, no shortness of breath more than his typical baseline, no abdominal pain, no fever/chills/night sweats, no nausea/vomiting/diarrhea.  No acute events overnight per nursing staff.  Discharge Exam: Vitals:   08/17/21 0954 08/17/21 1000  BP: (!) 117/48 130/68  Pulse: (!) 108 (!) 109  Resp:  (!) 29  Temp:  98.9 F (37.2 C)  SpO2:  93%   Vitals:   08/17/21 0800 08/17/21 0900 08/17/21 0954 08/17/21 1000  BP: (!) 133/47 (!) 117/48 (!) 117/48 130/68  Pulse: (!) 103 (!) 106 (!) 108 (!) 109  Resp: (!) 26 (!) 27  (!) 29  Temp: 98.3 F (36.8 C)   98.9 F (37.2 C)  TempSrc:    Axillary  SpO2: 95% 94%  93%  Weight:      Height:        General: Pt is alert, awake, not in acute distress, morbidly obese Cardiovascular: RRR, S1/S2 +, no rubs, no gallops Respiratory: Breath sounds slightly decreased bilateral bases, no wheezing/crackles, normal respiratory effort on 2 L nasal cannula.  Abdominal: Soft, NT, ND, bowel sounds +, ostomy noted with brown stool Extremities: no edema, no cyanosis    The results of  significant diagnostics from this hospitalization (including imaging, microbiology, ancillary and laboratory) are listed below for reference.     Microbiology: Recent Results (from the past 240 hour(s))  Blood culture (routine x 2)     Status: None   Collection Time: 08/09/21 10:47 PM   Specimen: BLOOD RIGHT HAND  Result Value Ref Range Status   Specimen Description BLOOD RIGHT HAND  Final   Special Requests   Final    BOTTLES DRAWN AEROBIC AND ANAEROBIC Blood Culture adequate volume   Culture   Final    NO GROWTH 5 DAYS Performed at [facility], [address] [place], [place] [address]    Report Status 08/14/2021 FINAL  Final  Urine Culture     Status: None   Collection Time: 08/09/21 10:47 PM   Specimen: Urine, Catheterized  Result Value Ref Range Status   Specimen Description   Final    URINE, CATHETERIZED Performed at [facility], [address] [place], [place] [address]    Special Requests   Final    NONE Performed at [facility], [address] [place], [place] [address]    Culture   Final    NO GROWTH Performed at [facility], [place] [address] [place], [place] [address]    Report Status 08/11/2021 FINAL  Final  Resp Panel by RT-PCR (Flu A&B, Covid) Nasopharyngeal Swab     Status: None   Collection Time: 08/09/21 10:48 PM   Specimen: Nasopharyngeal Swab; Nasopharyngeal(NP) swabs in vial transport medium  Result Value Ref Range Status   SARS Coronavirus 2 by RT PCR NEGATIVE NEGATIVE Final    Comment: (NOTE) SARS-CoV-2 target nucleic acids are NOT DETECTED.  The SARS-CoV-2 RNA is generally detectable in upper respiratory specimens during the acute phase of infection. The lowest concentration of SARS-CoV-2 viral copies this assay can detect is 138 copies/mL. A negative result does not preclude SARS-Cov-2 infection and should not be used as the sole basis for treatment or other patient management decisions. A negative result  may occur with  improper specimen collection/handling, submission of specimen other than nasopharyngeal swab, presence of viral mutation(s) within the areas targeted by this assay, and inadequate  number of viral copies(<138 copies/mL). A negative result must be combined with clinical observations, patient history, and epidemiological information. The expected result is Negative.  Fact Sheet for Patients:  EntrepreneurPulse.com.au  Fact Sheet for Healthcare Providers:  IncredibleEmployment.be  This test is no t yet approved or cleared by the [place] FDA and  has been authorized for detection and/or diagnosis of SARS-CoV-2 by FDA under an Emergency Use Authorization (EUA). This EUA will remain  in effect (meaning this test can be used) for the duration of the COVID-19 declaration under Section 564(b)(1) of the Act, 21 U.S.C.section 360bbb-3(b)(1), unless the authorization is terminated  or revoked sooner.       Influenza A by PCR NEGATIVE NEGATIVE Final   Influenza B by PCR NEGATIVE NEGATIVE Final    Comment: (NOTE) The Xpert Xpress SARS-CoV-2/FLU/RSV plus assay is intended as an aid in the diagnosis of influenza from Nasopharyngeal swab specimens and should not be used as a sole basis for treatment. Nasal washings and aspirates are unacceptable for Xpert Xpress SARS-CoV-2/FLU/RSV testing.  Fact Sheet for Patients: EntrepreneurPulse.com.au  Fact Sheet for Healthcare Providers: IncredibleEmployment.be  This test is not yet approved or cleared by the [place] FDA and has been authorized for detection and/or diagnosis of SARS-CoV-2 by FDA under an Emergency Use Authorization (EUA). This EUA will remain in effect (meaning this test can be used) for the duration of the COVID-19 declaration under Section 564(b)(1) of the Act, 21 U.S.C. section 360bbb-3(b)(1), unless the authorization is terminated  or revoked.  Performed at [facility], [address] [place], [place] [address]   Blood culture (routine x 2)     Status: None   Collection Time: 08/09/21 11:21 PM   Specimen: BLOOD LEFT HAND  Result Value Ref Range Status   Specimen Description BLOOD LEFT HAND  Final   Special Requests   Final    PEDIATRICS Blood Culture results may not be optimal due to an excessive volume of blood received in culture bottles   Culture   Final    NO GROWTH 5 DAYS Performed at [facility], [address] [place], [place] [address]    Report Status 08/14/2021 FINAL  Final  CULTURE, BLOOD (ROUTINE X 2) w Reflex to ID Panel     Status: None (Preliminary result)   Collection Time: 08/15/21 12:45 PM   Specimen: BLOOD  Result Value Ref Range Status   Specimen Description BLOOD LEFT HAND  Final   Special Requests   Final    BOTTLES DRAWN AEROBIC AND ANAEROBIC Blood Culture adequate volume   Culture   Final    NO GROWTH 2 DAYS Performed at [facility], [address], [place], [place] [address]    Report Status PENDING  Incomplete  CULTURE, BLOOD (ROUTINE X 2) w Reflex to ID Panel     Status: None (Preliminary result)   Collection Time: 08/15/21 12:46 PM   Specimen: BLOOD  Result Value Ref Range Status   Specimen Description BLOOD RIGHT HAND  Final   Special Requests   Final    BOTTLES DRAWN AEROBIC AND ANAEROBIC Blood Culture adequate volume   Culture   Final    NO GROWTH 2 DAYS Performed at [facility], [address] [place], [place] [address]    Report Status PENDING  Incomplete  Resp Panel by RT-PCR (Flu A&B, Covid) Nasopharyngeal Swab     Status: None   Collection Time: 08/16/21  4:18 PM   Specimen:  Nasopharyngeal Swab; Nasopharyngeal(NP) swabs in vial transport medium  Result Value Ref Range Status   SARS Coronavirus 2 by RT PCR NEGATIVE NEGATIVE Final    Comment: (NOTE) SARS-CoV-2 target nucleic acids are NOT DETECTED.  The SARS-CoV-2  RNA is generally detectable in upper respiratory specimens during the acute phase of infection. The lowest concentration of SARS-CoV-2 viral copies this assay can detect is 138 copies/mL. A negative result does not preclude SARS-Cov-2 infection and should not be used as the sole basis for treatment or other patient management decisions. A negative result may occur with  improper specimen collection/handling, submission of specimen other than nasopharyngeal swab, presence of viral mutation(s) within the areas targeted by this assay, and inadequate number of viral copies(<138 copies/mL). A negative result must be combined with clinical observations, patient history, and epidemiological information. The expected result is Negative.  Fact Sheet for Patients:  EntrepreneurPulse.com.au  Fact Sheet for Healthcare Providers:  IncredibleEmployment.be  This test is no t yet approved or cleared by the [place] FDA and  has been authorized for detection and/or diagnosis of SARS-CoV-2 by FDA under an Emergency Use Authorization (EUA). This EUA will remain  in effect (meaning this test can be used) for the duration of the COVID-19 declaration under Section 564(b)(1) of the Act, 21 U.S.C.section 360bbb-3(b)(1), unless the authorization is terminated  or revoked sooner.       Influenza A by PCR NEGATIVE NEGATIVE Final   Influenza B by PCR NEGATIVE NEGATIVE Final    Comment: (NOTE) The Xpert Xpress SARS-CoV-2/FLU/RSV plus assay is intended as an aid in the diagnosis of influenza from Nasopharyngeal swab specimens and should not be used as a sole basis for treatment. Nasal washings and aspirates are unacceptable for Xpert Xpress SARS-CoV-2/FLU/RSV testing.  Fact Sheet for Patients: EntrepreneurPulse.com.au  Fact Sheet for Healthcare Providers: IncredibleEmployment.be  This test is not yet approved or cleared by the  [place] FDA and has been authorized for detection and/or diagnosis of SARS-CoV-2 by FDA under an Emergency Use Authorization (EUA). This EUA will remain in effect (meaning this test can be used) for the duration of the COVID-19 declaration under Section 564(b)(1) of the Act, 21 U.S.C. section 360bbb-3(b)(1), unless the authorization is terminated or revoked.  Performed at [facility], [place], [place] [address]   Respiratory (~20 pathogens) panel by PCR     Status: None   Collection Time: 08/16/21  4:18 PM   Specimen: Nasopharyngeal Swab; Respiratory  Result Value Ref Range Status   Adenovirus NOT DETECTED NOT DETECTED Final   Coronavirus 229E NOT DETECTED NOT DETECTED Final    Comment: (NOTE) The Coronavirus on the Respiratory Panel, DOES NOT test for the novel  Coronavirus (2019 nCoV)    Coronavirus HKU1 NOT DETECTED NOT DETECTED Final   Coronavirus NL63 NOT DETECTED NOT DETECTED Final   Coronavirus OC43 NOT DETECTED NOT DETECTED Final   Metapneumovirus NOT DETECTED NOT DETECTED Final   Rhinovirus / Enterovirus NOT DETECTED NOT DETECTED Final   Influenza A NOT DETECTED NOT DETECTED Final   Influenza B NOT DETECTED NOT DETECTED Final   Parainfluenza Virus 1 NOT DETECTED NOT DETECTED Final   Parainfluenza Virus 2 NOT DETECTED NOT DETECTED Final   Parainfluenza Virus 3 NOT DETECTED NOT DETECTED Final   Parainfluenza Virus 4 NOT DETECTED NOT DETECTED Final   Respiratory Syncytial Virus NOT DETECTED NOT DETECTED Final   Bordetella pertussis NOT DETECTED NOT DETECTED Final   Bordetella Parapertussis NOT DETECTED NOT  DETECTED Final   Chlamydophila pneumoniae NOT DETECTED NOT DETECTED Final   Mycoplasma pneumoniae NOT DETECTED NOT DETECTED Final    Comment: Performed at [facility], [place] [address], [place], [place] [address]     Labs: BNP (last 3 results) Recent Labs    07/08/21 1448 08/09/21 2247 08/11/21 0609  BNP 28.7 36.9 35.0    Basic Metabolic Panel: Recent Labs  Lab 08/12/21 0618 08/13/21 0430 08/14/21 0345 08/14/21 1222 08/15/21 0500 08/16/21 0521 08/17/21 0546  NA 140 142 141 143 138 137 137  K 3.8 2.8* 3.0* 3.6 3.2* 4.0 5.0  CL 100 98 96* 95* 92* 91* 92*  CO2 36* 35* 36* 38* 38* 37* 35*  GLUCOSE 164* 183* 153* 102* 91 117* 135*  BUN _0 <5*  CREATININE 0.46* 0.31* 0.43* 0.41* 0.41* 0.32* 0.42*  CALCIUM 9.2 9.4 9.3 9.6 9.2 9.4 9.7  MG 1.7 1.9 1.4* 2.1 1.6* 1.7 1.5*  PHOS 4.9* 4.6 4.8*  --  5.4* 5.0*  --    Liver Function Tests: No results for input(s): AST, ALT, ALKPHOS, BILITOT, PROT, ALBUMIN in the last 168 hours. No results for input(s): LIPASE, AMYLASE in the last 168 hours. No results for input(s): AMMONIA in the last 168 hours. CBC: Recent Labs  Lab 08/13/21 0430 08/14/21 0345 08/15/21 0500 08/16/21 0521 08/17/21 0546  WBC 5.1 6.1 8.2 7.0 9.0  NEUTROABS  --   --   --  4.4  --   HGB 8.7* 8.9* 9.1* 8.7* 8.7*  HCT 29.8* 31.0* 30.7* 30.2* 30.0*  MCV 93.4 95.4 91.9 94.1 94.3  PLT 176 200 250 247 279   Cardiac Enzymes: Recent Labs  Lab 08/15/21 1255  CKTOTAL 56   BNP: Invalid input(s): POCBNP CBG: Recent Labs  Lab 08/16/21 1918 08/16/21 2341 08/17/21 0402 08/17/21 0822 08/17/21 1131  GLUCAP 133* 115* 138* 123* 171*   D-Dimer No results for input(s): DDIMER in the last 72 hours. Hgb A1c No results for input(s): HGBA1C in the last 72 hours. Lipid Profile No results for input(s): CHOL, HDL, LDLCALC, TRIG, CHOLHDL, LDLDIRECT in the last 72 hours. Thyroid function studies No results for input(s): TSH, T4TOTAL, T3FREE, THYROIDAB in the last 72 hours.  Invalid input(s): FREET3 Anemia work up No results for input(s): VITAMINB12, FOLATE, FERRITIN, TIBC, IRON, RETICCTPCT in the last 72 hours. Urinalysis    Component Value Date/Time   COLORURINE YELLOW 08/09/2021 2247   APPEARANCEUR CLEAR 08/09/2021 2247   LABSPEC >1.030 (H) 08/09/2021 2247   PHURINE 5.5  08/09/2021 2247   GLUCOSEU NEGATIVE 08/09/2021 2247   HGBUR LARGE (A) 08/09/2021 2247   [place] NEGATIVE 08/09/2021 2247   KETONESUR NEGATIVE 08/09/2021 2247   PROTEINUR >300 (A) 08/09/2021 2247   NITRITE NEGATIVE 08/09/2021 2247   LEUKOCYTESUR NEGATIVE 08/09/2021 2247   Sepsis Labs Invalid input(s): PROCALCITONIN,  WBC,  LACTICIDVEN Microbiology Recent Results (from the past 240 hour(s))  Blood culture (routine x 2)     Status: None   Collection Time: 08/09/21 10:47 PM   Specimen: BLOOD RIGHT HAND  Result Value Ref Range Status   Specimen Description BLOOD RIGHT HAND  Final   Special Requests   Final    BOTTLES DRAWN AEROBIC AND ANAEROBIC Blood Culture adequate volume   Culture   Final    NO GROWTH 5 DAYS Performed at [facility], [address], [place], [place] [address]    Report Status 08/14/2021 FINAL  Final  Urine Culture  Status: None   Collection Time: 08/09/21 10:47 PM   Specimen: Urine, Catheterized  Result Value Ref Range Status   Specimen Description   Final    URINE, CATHETERIZED Performed at [facility], [address] [place], [place] [address]    Special Requests   Final    NONE Performed at [facility], [address], [place], [place] [address]    Culture   Final    NO GROWTH Performed at [facility], [place] [address], [place], [place] [address]    Report Status 08/11/2021 FINAL  Final  Resp Panel by RT-PCR (Flu A&B, Covid) Nasopharyngeal Swab     Status: None   Collection Time: 08/09/21 10:48 PM   Specimen: Nasopharyngeal Swab; Nasopharyngeal(NP) swabs in vial transport medium  Result Value Ref Range Status   SARS Coronavirus 2 by RT PCR NEGATIVE NEGATIVE Final    Comment: (NOTE) SARS-CoV-2 target nucleic acids are NOT DETECTED.  The SARS-CoV-2 RNA is generally detectable in upper respiratory specimens during the acute phase of infection. The lowest concentration of SARS-CoV-2 viral copies this assay  can detect is 138 copies/mL. A negative result does not preclude SARS-Cov-2 infection and should not be used as the sole basis for treatment or other patient management decisions. A negative result may occur with  improper specimen collection/handling, submission of specimen other than nasopharyngeal swab, presence of viral mutation(s) within the areas targeted by this assay, and inadequate number of viral copies(<138 copies/mL). A negative result must be combined with clinical observations, patient history, and epidemiological information. The expected result is Negative.  Fact Sheet for Patients:  EntrepreneurPulse.com.au  Fact Sheet for Healthcare Providers:  IncredibleEmployment.be  This test is no t yet approved or cleared by the [place] FDA and  has been authorized for detection and/or diagnosis of SARS-CoV-2 by FDA under an Emergency Use Authorization (EUA). This EUA will remain  in effect (meaning this test can be used) for the duration of the COVID-19 declaration under Section 564(b)(1) of the Act, 21 U.S.C.section 360bbb-3(b)(1), unless the authorization is terminated  or revoked sooner.       Influenza A by PCR NEGATIVE NEGATIVE Final   Influenza B by PCR NEGATIVE NEGATIVE Final    Comment: (NOTE) The Xpert Xpress SARS-CoV-2/FLU/RSV plus assay is intended as an aid in the diagnosis of influenza from Nasopharyngeal swab specimens and should not be used as a sole basis for treatment. Nasal washings and aspirates are unacceptable for Xpert Xpress SARS-CoV-2/FLU/RSV testing.  Fact Sheet for Patients: EntrepreneurPulse.com.au  Fact Sheet for Healthcare Providers: IncredibleEmployment.be  This test is not yet approved or cleared by the [place] FDA and has been authorized for detection and/or diagnosis of SARS-CoV-2 by FDA under an Emergency Use Authorization (EUA). This EUA will  remain in effect (meaning this test can be used) for the duration of the COVID-19 declaration under Section 564(b)(1) of the Act, 21 U.S.C. section 360bbb-3(b)(1), unless the authorization is terminated or revoked.  Performed at [facility], [address] [place], [place] [address]   Blood culture (routine x 2)     Status: None   Collection Time: 08/09/21 11:21 PM   Specimen: BLOOD LEFT HAND  Result Value Ref Range Status   Specimen Description BLOOD LEFT HAND  Final   Special Requests   Final    PEDIATRICS Blood Culture results may not be optimal due to an excessive volume of blood received in culture bottles   Culture  Final    NO GROWTH 5 DAYS Performed at [facility], [address] [place], [place] [address]    Report Status 08/14/2021 FINAL  Final  CULTURE, BLOOD (ROUTINE X 2) w Reflex to ID Panel     Status: None (Preliminary result)   Collection Time: 08/15/21 12:45 PM   Specimen: BLOOD  Result Value Ref Range Status   Specimen Description BLOOD LEFT HAND  Final   Special Requests   Final    BOTTLES DRAWN AEROBIC AND ANAEROBIC Blood Culture adequate volume   Culture   Final    NO GROWTH 2 DAYS Performed at [facility], [address], [place], [place] [address]    Report Status PENDING  Incomplete  CULTURE, BLOOD (ROUTINE X 2) w Reflex to ID Panel     Status: None (Preliminary result)   Collection Time: 08/15/21 12:46 PM   Specimen: BLOOD  Result Value Ref Range Status   Specimen Description BLOOD RIGHT HAND  Final   Special Requests   Final    BOTTLES DRAWN AEROBIC AND ANAEROBIC Blood Culture adequate volume   Culture   Final    NO GROWTH 2 DAYS Performed at [facility], [address], [place], [place] [address]    Report Status PENDING  Incomplete  Resp Panel by RT-PCR (Flu A&B, Covid) Nasopharyngeal Swab     Status: None   Collection Time: 08/16/21  4:18 PM   Specimen: Nasopharyngeal Swab; Nasopharyngeal(NP)  swabs in vial transport medium  Result Value Ref Range Status   SARS Coronavirus 2 by RT PCR NEGATIVE NEGATIVE Final    Comment: (NOTE) SARS-CoV-2 target nucleic acids are NOT DETECTED.  The SARS-CoV-2 RNA is generally detectable in upper respiratory specimens during the acute phase of infection. The lowest concentration of SARS-CoV-2 viral copies this assay can detect is 138 copies/mL. A negative result does not preclude SARS-Cov-2 infection and should not be used as the sole basis for treatment or other patient management decisions. A negative result may occur with  improper specimen collection/handling, submission of specimen other than nasopharyngeal swab, presence of viral mutation(s) within the areas targeted by this assay, and inadequate number of viral copies(<138 copies/mL). A negative result must be combined with clinical observations, patient history, and epidemiological information. The expected result is Negative.  Fact Sheet for Patients:  EntrepreneurPulse.com.au  Fact Sheet for Healthcare Providers:  IncredibleEmployment.be  This test is no t yet approved or cleared by the [place] FDA and  has been authorized for detection and/or diagnosis of SARS-CoV-2 by FDA under an Emergency Use Authorization (EUA). This EUA will remain  in effect (meaning this test can be used) for the duration of the COVID-19 declaration under Section 564(b)(1) of the Act, 21 U.S.C.section 360bbb-3(b)(1), unless the authorization is terminated  or revoked sooner.       Influenza A by PCR NEGATIVE NEGATIVE Final   Influenza B by PCR NEGATIVE NEGATIVE Final    Comment: (NOTE) The Xpert Xpress SARS-CoV-2/FLU/RSV plus assay is intended as an aid in the diagnosis of influenza from Nasopharyngeal swab specimens and should not be used as a sole basis for treatment. Nasal washings and aspirates are unacceptable for Xpert Xpress  SARS-CoV-2/FLU/RSV testing.  Fact Sheet for Patients: EntrepreneurPulse.com.au  Fact Sheet for Healthcare Providers: IncredibleEmployment.be  This test is not yet approved or cleared by the [place] FDA and has been authorized for detection and/or diagnosis of SARS-CoV-2 by FDA under an Emergency Use Authorization (EUA). This  EUA will remain in effect (meaning this test can be used) for the duration of the COVID-19 declaration under Section 564(b)(1) of the Act, 21 U.S.C. section 360bbb-3(b)(1), unless the authorization is terminated or revoked.  Performed at [facility], [place], [place] [address]   Respiratory (~20 pathogens) panel by PCR     Status: None   Collection Time: 08/16/21  4:18 PM   Specimen: Nasopharyngeal Swab; Respiratory  Result Value Ref Range Status   Adenovirus NOT DETECTED NOT DETECTED Final   Coronavirus 229E NOT DETECTED NOT DETECTED Final    Comment: (NOTE) The Coronavirus on the Respiratory Panel, DOES NOT test for the novel  Coronavirus (2019 nCoV)    Coronavirus HKU1 NOT DETECTED NOT DETECTED Final   Coronavirus NL63 NOT DETECTED NOT DETECTED Final   Coronavirus OC43 NOT DETECTED NOT DETECTED Final   Metapneumovirus NOT DETECTED NOT DETECTED Final   Rhinovirus / Enterovirus NOT DETECTED NOT DETECTED Final   Influenza A NOT DETECTED NOT DETECTED Final   Influenza B NOT DETECTED NOT DETECTED Final   Parainfluenza Virus 1 NOT DETECTED NOT DETECTED Final   Parainfluenza Virus 2 NOT DETECTED NOT DETECTED Final   Parainfluenza Virus 3 NOT DETECTED NOT DETECTED Final   Parainfluenza Virus 4 NOT DETECTED NOT DETECTED Final   Respiratory Syncytial Virus NOT DETECTED NOT DETECTED Final   Bordetella pertussis NOT DETECTED NOT DETECTED Final   Bordetella Parapertussis NOT DETECTED NOT DETECTED Final   Chlamydophila pneumoniae NOT DETECTED NOT DETECTED Final   Mycoplasma pneumoniae NOT  DETECTED NOT DETECTED Final    Comment: Performed at [facility], [place]. [address], [place], [place] [address]     Time coordinating discharge: Over 30 minutes  SIGNED:   Maitri Schnoebelen J [place] DO  [facility] 08/17/2021, 11:35 AM

## 2021-08-17 NOTE — TOC Transition Note (Signed)
Transition of Care [facility]) - CM/SW Discharge Note   Patient Details  Name: Craig [name] MRN: [id number] Date of Birth: 1991/08/07  [facility]) CM/SW Contact:  Shelbie Hutching, RN Phone Number: 08/17/2021, 11:57 AM   Clinical Narrative:    Patient medically cleared for discharge home with home health services. Today.  Patient has all needed DME at home except for NIV which will be delivered by [facility], waiting to hear back from [facility] with [facility] if a respiratory therapist will be available this afternoon to teach family.   Home health already arranged with Dierdre Searles called and notified that patient will be discharging today and orders for [facility] PT and OT are in.  Patient will need non emergent EMS transport for home.   Wife at the bedside and agreeable with plan.     Final next level of care: [place] Barriers to Discharge: Barriers Resolved   Patient Goals and CMS Choice Patient states their goals for this hospitalization and ongoing recovery are:: glad to be going home CMS Medicare.gov Compare Post Acute Care list provided to:: Patient Choice offered to / list presented to : Patient, Spouse  Discharge Placement                Patient to be transferred to facility by: EMS to transport home Name of family member notified: [name] Patient and family notified of of transfer: 08/17/21  Discharge Plan and Services                DME Arranged: NIV DME Agency: [facility] Date DME Agency Contacted: 08/17/21 Time DME Agency Contacted: 1156 Representative spoke with at DME Agency: [place]: PT, OT [place] Agency: [place] Date [place]: 08/17/21 Time [place]: 1156 Representative spoke with at [place]: Minette Brine [phone]  Social Determinants of Health ([place]) Interventions     Readmission Risk Interventions Readmission Risk Prevention Plan 07/10/2021  Transportation Screening Complete   PCP or Specialist Appt within 3-5 Days Complete  HRI or [place] Complete  Social Work Consult for [place] Planning/Counseling [place] Screening Not Applicable  Medication Review Press photographer) Complete  Some recent data might be hidden

## 2021-08-17 NOTE — TOC Progression Note (Signed)
Transition of Care [facility]) - Progression Note    Patient Details  Name: Craig [name] MRN: [id number] Date of Birth: March 03, 1991  Transition of Care [facility]) CM/SW Contact  Shelbie Hutching, RN Phone Number: 08/17/2021, 12:04 PM  Clinical Narrative:    Patient and patient's wife asking to be followed by outpatient palliative, they would like to use [facility]- RNCM will reach out to [facility] to ensure they can provide palliative services.     Expected Discharge Plan:  (TBD) Barriers to Discharge: Barriers Resolved  Expected Discharge Plan and Services Expected Discharge Plan:  (TBD)         Expected Discharge Date: 08/17/21               DME Arranged: NIV DME Agency: [facility] Date DME Agency Contacted: 08/17/21 Time DME Agency Contacted: [phone] Representative spoke with at DME Agency: [place]: PT, OT [place] Agency: [place] Date [place]: 08/17/21 Time [place]: 1156 Representative spoke with at [place]: [place]   Social Determinants of Health ([place]) Interventions    Readmission Risk Interventions Readmission Risk Prevention Plan 07/10/2021  Transportation Screening Complete  PCP or Specialist Appt within 3-5 Days Complete  HRI or [place] Complete  Social Work Consult for [place] Planning/Counseling [place] Not Applicable  Medication Review Press photographer) Complete  Some recent data might be hidden

## 2021-08-17 NOTE — Progress Notes (Signed)
Disappointing day. Patient discharged but Zack with [facility] was unable to acquire a Trilogy BiPAP for home use. Patient feeling much better and chatty. Wife in room most of the day with patient.  Treated for knee and shoulder pain today.

## 2021-08-17 NOTE — TOC Progression Note (Signed)
Transition of Care [facility]) - Progression Note    Patient Details  Name: Craig [name] MRN: [id number] Date of Birth: 01-26-1991  Transition of Care [facility]) CM/SW Contact  Shelbie Hutching, RN Phone Number: 08/17/2021, 3:20 PM  Clinical Narrative:    Waiting to hear back from [facility] about delivery of the NIV- patient should not go home without it due to his high risk for readmission without it.      Expected Discharge Plan:  (TBD) Barriers to Discharge: Barriers Resolved  Expected Discharge Plan and Services Expected Discharge Plan:  (TBD)         Expected Discharge Date: 08/17/21               DME Arranged: NIV DME Agency: [facility] Date DME Agency Contacted: 08/17/21 Time DME Agency Contacted: 1423 Representative spoke with at DME Agency: [place]: PT, OT [place] Agency: [place] Date [place]: 08/17/21 Time [place]: 1156 Representative spoke with at [place]: [place]   Social Determinants of Health ([place]) Interventions    Readmission Risk Interventions Readmission Risk Prevention Plan 07/10/2021  Transportation Screening Complete  PCP or Specialist Appt within 3-5 Days Complete  HRI or [place] Complete  Social Work Consult for [place] Planning/Counseling [place] Not Applicable  Medication Review Press photographer) Complete  Some recent data might be hidden

## 2021-08-18 DIAGNOSIS — J9622 Acute and chronic respiratory failure with hypercapnia: Secondary | ICD-10-CM | POA: Diagnosis not present

## 2021-08-18 DIAGNOSIS — J9621 Acute and chronic respiratory failure with hypoxia: Secondary | ICD-10-CM | POA: Diagnosis not present

## 2021-08-18 LAB — GLUCOSE, CAPILLARY
Glucose-Capillary: 130 mg/dL — ABNORMAL HIGH (ref 70–99)
Glucose-Capillary: 134 mg/dL — ABNORMAL HIGH (ref 70–99)
Glucose-Capillary: 137 mg/dL — ABNORMAL HIGH (ref 70–99)
Glucose-Capillary: 142 mg/dL — ABNORMAL HIGH (ref 70–99)
Glucose-Capillary: 160 mg/dL — ABNORMAL HIGH (ref 70–99)
Glucose-Capillary: 204 mg/dL — ABNORMAL HIGH (ref 70–99)

## 2021-08-18 MED ORDER — DOCUSATE SODIUM 100 MG PO CAPS: 100.0000 mg | ORAL_CAPSULE | Freq: Two times a day (BID) | ORAL | Status: DC | PRN

## 2021-08-18 MED ORDER — RIVAROXABAN 20 MG PO TABS
20.0000 mg | ORAL_TABLET | Freq: Every day | ORAL | Status: DC
  Administered 2021-08-19 – 2021-08-23 (×5): 20 mg via ORAL
  Filled 2021-08-18 (×5): qty 1

## 2021-08-18 MED ORDER — POLYETHYLENE GLYCOL 3350 17 G PO PACK
17.0000 g | PACK | Freq: Every day | ORAL | Status: DC
  Administered 2021-08-21 – 2021-08-22 (×2): 17 g via ORAL
  Filled 2021-08-18 (×5): qty 1

## 2021-08-18 MED ORDER — NYSTATIN 100000 UNIT/GM EX POWD
Freq: Two times a day (BID) | CUTANEOUS | Status: AC
  Administered 2021-08-18 – 2021-08-19 (×2): 1 via TOPICAL
  Filled 2021-08-18: qty 15

## 2021-08-18 MED ORDER — CEFADROXIL 500 MG PO CAPS
1000.0000 mg | ORAL_CAPSULE | Freq: Two times a day (BID) | ORAL | Status: DC
  Administered 2021-08-18 – 2021-08-23 (×11): 1000 mg via ORAL
  Filled 2021-08-18 (×13): qty 2

## 2021-08-18 MED ORDER — OXYCODONE HCL 5 MG PO TABS
5.0000 mg | ORAL_TABLET | Freq: Three times a day (TID) | ORAL | Status: DC | PRN
Start: 2021-08-18 — End: 2021-08-23
  Administered 2021-08-19: 10 mg via ORAL
  Administered 2021-08-21 – 2021-08-22 (×3): 5 mg via ORAL
  Administered 2021-08-23 (×2): 10 mg via ORAL
  Filled 2021-08-18: qty 1
  Filled 2021-08-18: qty 2
  Filled 2021-08-18: qty 1
  Filled 2021-08-18: qty 2
  Filled 2021-08-18: qty 1
  Filled 2021-08-18: qty 2

## 2021-08-18 MED ORDER — VALPROIC ACID 250 MG PO CAPS
500.0000 mg | ORAL_CAPSULE | Freq: Three times a day (TID) | ORAL | Status: DC
  Administered 2021-08-19 – 2021-08-23 (×14): 500 mg via ORAL
  Filled 2021-08-18 (×17): qty 2

## 2021-08-18 MED ORDER — CLOTRIMAZOLE 1 % EX CREA
TOPICAL_CREAM | Freq: Two times a day (BID) | CUTANEOUS | Status: DC
  Administered 2021-08-19: 1 via TOPICAL
  Filled 2021-08-18: qty 15

## 2021-08-18 MED ORDER — CLONAZEPAM 0.5 MG PO TABS
1.0000 mg | ORAL_TABLET | Freq: Two times a day (BID) | ORAL | Status: DC
  Administered 2021-08-19 – 2021-08-23 (×9): 1 mg via ORAL
  Filled 2021-08-18 (×2): qty 1
  Filled 2021-08-18: qty 2
  Filled 2021-08-18 (×2): qty 1
  Filled 2021-08-18 (×3): qty 2
  Filled 2021-08-18: qty 1

## 2021-08-18 MED ORDER — DOCUSATE SODIUM 100 MG PO CAPS
100.0000 mg | ORAL_CAPSULE | Freq: Two times a day (BID) | ORAL | Status: DC
  Administered 2021-08-20 – 2021-08-22 (×4): 100 mg via ORAL
  Filled 2021-08-18 (×8): qty 1

## 2021-08-18 NOTE — Progress Notes (Signed)
Date of Admission:  08/09/2021     ID: Craig [name] is a 30 y.o. male  Principal Problem:   Acute on chronic respiratory failure with hypoxia and hypercapnia (HCC)    Subjective: Pt awake and alert Not short of breath No body ache No fever Wants to go home Waiting for Trilogy  Medications:   (feeding supplement) PROSource Plus  30 mL Oral TID BM   arformoterol  15 mcg Nebulization BID   vitamin C  500 mg Oral BID   budesonide (PULMICORT) nebulizer solution  0.25 mg Nebulization BID   cefadroxil  1,000 mg Oral BID   Chlorhexidine Gluconate Cloth  6 each Topical Daily   clonazePAM  1 mg Per Tube BID   docusate  100 mg Per Tube BID   DULoxetine  60 mg Oral Daily   feeding supplement  1 Container Oral TID BM   furosemide  40 mg Intravenous QODAY   insulin aspart  0-20 Units Subcutaneous Q4H   lidocaine  1 patch Transdermal Q24H   mouth rinse  15 mL Mouth Rinse BID   metoprolol succinate  100 mg Oral Daily   multivitamin with minerals  1 tablet Oral Daily   pantoprazole (PROTONIX) IV  40 mg Intravenous QHS   polyethylene glycol  17 g Per Tube Daily   QUEtiapine  25 mg Oral QHS   rivaroxaban  20 mg Per Tube Daily   valproic acid  500 mg Per Tube TID    Objective: Vital signs in last 24 hours: Patient Vitals for the past 24 hrs:  BP Temp Temp src Pulse Resp SpO2 Weight  08/18/21 1200 128/76 -- -- 98 (!) 25 94 % --  08/18/21 1100 130/70 -- -- 99 (!) 25 92 % --  08/18/21 1000 115/79 -- -- (!) 103 (!) 23 96 % --  08/18/21 0900 (!) 148/75 -- -- 94 (!) 23 97 % --  08/18/21 0800 127/71 -- -- 89 (!) 22 100 % --  08/18/21 0700 129/75 -- -- 88 19 100 % --  08/18/21 0600 124/72 -- -- 86 20 100 % --  08/18/21 0500 131/67 99.2 F (37.3 C) Oral 87 (!) 21 100 % --  08/18/21 0402 (!) 142/65 -- -- 86 20 100 % 136 kg  08/18/21 0400 (!) 142/65 -- -- 85 (!) 21 100 % --  08/18/21 0300 131/90 -- -- 83 20 100 % --  08/18/21 0200 127/76 -- -- 83 (!) 21 100 % --  08/18/21 0100  116/66 -- -- 82 (!) 21 95 % --  08/18/21 0000 111/60 98.8 F (37.1 C) -- 87 (!) 21 98 % --  08/17/21 2300 118/65 -- -- 88 (!) 27 93 % --  08/17/21 2200 126/63 -- -- 91 (!) 21 94 % --  08/17/21 2100 112/73 -- -- 96 (!) 24 94 % --  08/17/21 2000 (!) 116/51 -- -- (!) 101 17 95 % --  08/17/21 1900 122/67 99.1 F (37.3 C) Oral 100 (!) 24 94 % --  08/17/21 1800 (!) 117/58 -- -- (!) 104 (!) 27 94 % --  08/17/21 1737 -- -- -- -- -- (!) 89 % --  08/17/21 1700 120/71 -- -- (!) 106 (!) 32 93 % --  08/17/21 1600 (!) 115/59 98.1 F (36.7 C) Axillary (!) 110 (!) 34 93 % --  08/17/21 1500 (!) 108/52 -- -- (!) 108 (!) 36 90 % --  08/17/21 1429 -- -- -- -- -- 91 % --  08/17/21 1400 105/63 -- -- (!) 109 (!) 26 (!) 81 % --     PHYSICAL EXAM:  General: awake, alert,  Lungs: b/l air entry- Heart: tachycardia Abdomen: Soft, colostomy Extremities: edematous  Foley Swelling of rt knee- dry tap  Neurologic: unable to assess   Lab Results Recent Labs    08/16/21 0521 08/17/21 0546  WBC 7.0 9.0  HGB 8.7* 8.7*  HCT 30.2* 30.0*  NA 137 137  K 4.0 5.0  CL 91* 92*  CO2 37* 35*  BUN 6 <5*  CREATININE 0.32* 0.42*    Studies/Results: No results found.   Assessment/Plan: Acute hypoxic and hypercarbic resp falilure on chronic respiratory failure- this episode precipitated by anxiety Pt has underlying OSA but has never been assessed and given CPAP Stable and waiting to go home on BIPAP     New fever- has resolved- blood culture neg COVID/FLU/RESP PCR neg Was on dapto and zosyn- dc 09/17/20   Seizures- could be related to the acute hypoxia.  EEG done - no seizure activity noted   COVID respiratory illness in 2021 leading to Underlying atelectasis and ILD? need for oxygen /   Recent MSSA bacteremia and Rt septic knee S/p I/D and also received 4 weeks of IV cefazolin- will do PO cefadroxil 1 gram BID until 09/16/21  Anemia   Discussed the management with the patient, and care team ID  will sign off- call if needed

## 2021-08-18 NOTE — Progress Notes (Signed)
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  On arrival to the ED, patient was being bagged by EMS to maintain SPO2 in the low 90s.  He was noted to have an elevated WBC count of 22, tachypneic, tachycardic and hypoxia with concern for sepsis secondary to pneumonia in the setting of seizure activity.  Patient was started on empiric antibiotics and intubated by EDP for severe hypoxic/hypercarbic respite failure in the setting of obesity, OHS, severe deconditioning..  Patient was initially admitted to the critical care service.  Patient was extubated on 08/14/2021.  Patient was transferred to hospital service on 12/27.   Assessment & Plan:   Principal Problem:   Acute on chronic respiratory failure with hypoxia and hypercapnia (HCC)   Acute on chronic hypoxic and hypercarbic respiratory failure, POA Morbid obesity with obesity hypoventilation, severe deconditioning Patient with history of severe Covid-19 pneumonia initially requiring tracheostomy which has subsequently been decannulated, MSSA pneumonia, Morbid obesity, probable OHS/OSA and acute worsening likely secondary to multifactorial etiology from sepsis, OSA, OHS.  Was initially admitted by [facility] service  after being intubated by ED physician.  Was successfully extubated on 08/14/2021 to BiPAP. --Continue BiPAP nightly, social work assisting with NIV for home use --Brovana neb BID --Pulmicort neb BID --Nasal cannula during the day, maintain SPO2 greater than 92% --Aggressive pulmonary hygiene --Awaiting insurance authorization in order for [employer] to deliver trilogy NIV; as without this, patient has a very high bounce back potential given his brittle respiratory status   Sepsis 2/2 MSSA bacteremia with MSSA right knee septic arthritis s/p I&D x2 Patient seen by ID on 12/20. Was to complete IV Cefazolin on 12/22 then plan to transition to Cefadroxil continue IV cefazolin while inpatient.  TEE 07/12/2021 negative.  Now with recurrent fever with T-max 100.9 on 08/15/2021. --Infectious disease following, appreciate assistance --Blood cultures 12/21 with no growth x5 days --Repeat blood cultures 12/27: No growth x3 days --PICC line removed 12/28 --Cefadroxil  Acute metabolic encephalopathy likely multifactorial in the setting of sepsis Baseline issues with anxiety/impulsivity CT Head negative for acute abnormality, Limited MRI negative for acute abnormality. EEG without seizure activity --Depakote 500 mg TID --Klonopin 1 mg BID --Ativan PRN --Avoid fentanyl due to paradoxic agitation   Seizure Like Activity Similar presentation during last admission, Tramadol was previously discontinued due to suspected seizures, it appears that this was restarted as outpatient, now [place]. CT Head shows no acute intracranial abnormality. MRI shows no acute abnormality though limited study. EEG showed no seizure activity. May have been myoclonic activity due to hypoxia. --Depakene 500 mg q8h --Seizure precautions   H/o upper extremity DVT It appears that he was started on Xarelto before around Feb/Mar 2022 for provoked upper extremity DVT. CTA chest neg for PE --Continue Xarelto   Anasarca --net  negative 1.5L past 24h, net negative 2.8L since admission --Furosemide 40 mg IV every other  day   Diabetes mellitus Last HgbA1c 6.8 on 07/08/21.  Diet controlled at home. --Resistant SSI for coverage --CBGs qAC/HS  Sinus tachycardia --Metoprolol succinate 100 mg p.o. daily  Morbid obesity Body mass index is 39.57 kg/m.  Discussed with patient needs for aggressive lifestyle changes/weight loss as this complicates all facets of care.  Outpatient follow-up with PCP.  May benefit from bariatric evaluation outpatient.  Weakness/deconditioning/debility: --PT/OT recommending home health on discharge --Continue therapy efforts while inpatient   DVT prophylaxis:  rivaroxaban (XARELTO) tablet 20 mg    Code Status: Full Code Family Communication: Updated spouse present at bedside this morning  Disposition Plan:  Level of care: Stepdown Status is: Inpatient  Remains inpatient appropriate because: Remains on IV antibiotics, removing PICC line today, awaiting further recommendations per ID.    Consultants:  PCCM -signed off 12/27 Infectious disease  Procedures:  Intubated 12/21 Extubated 12/26 PICC line removal 12/28  Antimicrobials:  Daptomycin 12/27>> Zosyn 12/27 Vancomycin 12/21 -12/22 Metronidazole 12/21 - 12/21 Aztreonam 12/21 - 12/21 Cefazolin 12/22 12/27 Levofloxacin 12/27 - 12/27    Subjective: Patient seen examined bedside, resting comfortably.  Sleeping in bed, easily arousable.  No family present this morning.  Awaiting insurance authorization for [employer] to bring home NIV to patient for discharge.  Unsafe discharge until patient receives devices he is a high bounce back potential given his brittle respiratory status.  No other questions or concerns at this time.  Denies headache, no chest pain, no palpitations, no chills/night sweats, no nausea/vomiting/diarrhea, no abdominal pain.  No acute events overnight per nursing staff.  Objective: Vitals:    08/18/21 1000 08/18/21 1100 08/18/21 1200 08/18/21 1300  BP: 115/79 130/70 128/76 (!) 146/85  Pulse: (!) 103 99 98 100  Resp: (!) 23 (!) 25 (!) 25 (!) 31  Temp:   98.4 F (36.9 C)   TempSrc:      SpO2: 96% 92% 94% 96%  Weight:      Height:        Intake/Output Summary (Last 24 hours) at 08/18/2021 1622 Last data filed at 08/18/2021 1200 Gross per 24 hour  Intake 1193.08 ml  Output 2610 ml  Net -1416.92 ml   Filed Weights   08/16/21 0323 08/17/21 0414 08/18/21 0402  Weight: (!) 138 kg 134.4 kg 136 kg    Examination:  General exam: Appears calm and comfortable, obese, chronically ill appearance, appears older than stated age Respiratory system: Breath sounds slightly decreased bilateral bases, normal Respaire effort, on 2 L nasal cannula with SPO2 96% at rest. Cardiovascular system: S1 & S2 heard, RRR. No JVD, murmurs, rubs, gallops or clicks. No pedal edema. Gastrointestinal system: Abdomen is nondistended, soft and nontender. No organomegaly or masses felt. Normal bowel sounds heard.  Ostomy noted with brown stool in collection bag. Central nervous system: Alert and oriented. No focal neurological deficits. Extremities: Symmetric 5 x 5 power. Skin: No rashes, lesions or ulcers Psychiatry: Judgement and insight appear normal. Mood & affect appropriate.     Data Reviewed: I have personally reviewed following labs and imaging studies  CBC: Recent Labs  Lab 08/13/21 0430 08/14/21 0345 08/15/21 0500 08/16/21 0521 08/17/21 0546  WBC 5.1 6.1 8.2 7.0 9.0  NEUTROABS  --   --   --  4.4  --   HGB 8.7* 8.9* 9.1* 8.7* 8.7*  HCT 29.8* 31.0* 30.7* 30.2* 30.0*  MCV 93.4 95.4 91.9 94.1 94.3  PLT 176 200 250 247 751   Basic Metabolic Panel: Recent  Labs  Lab 08/12/21 0618 08/13/21 0430 08/14/21 0345 08/14/21 1222 08/15/21 0500 08/16/21 0521 08/17/21 0546  NA 140 142 141 143 138 137 137  K 3.8 2.8* 3.0* 3.6 3.2* 4.0 5.0  CL 100 98 96* 95* 92* 91* 92*  CO2 36* 35* 36*  38* 38* 37* 35*  GLUCOSE 164* 183* 153* 102* 91 117* 135*  BUN 11 15 17 13 8 6  <5*  CREATININE 0.46* 0.31* 0.43* 0.41* 0.41* 0.32* 0.42*  CALCIUM 9.2 9.4 9.3 9.6 9.2 9.4 9.7  MG 1.7 1.9 1.4* 2.1 1.6* 1.7 1.5*  PHOS 4.9* 4.6 4.8*  --  5.4* 5.0*  --    GFR: Estimated Creatinine Clearance: 191.8 mL/min (A) (by C-G formula based on SCr of 0.42 mg/dL (L)). Liver Function Tests: No results for input(s): AST, ALT, ALKPHOS, BILITOT, PROT, ALBUMIN in the last 168 hours.  No results for input(s): LIPASE, AMYLASE in the last 168 hours. No results for input(s): AMMONIA in the last 168 hours. Coagulation Profile: No results for input(s): INR, PROTIME in the last 168 hours.  Cardiac Enzymes: Recent Labs  Lab 08/15/21 1255  CKTOTAL 56   BNP (last 3 results) No results for input(s): PROBNP in the last 8760 hours. HbA1C: No results for input(s): HGBA1C in the last 72 hours. CBG: Recent Labs  Lab 08/17/21 2321 08/18/21 0344 08/18/21 0716 08/18/21 1112 08/18/21 1533  GLUCAP 147* 137* 134* 130* 204*   Lipid Profile: No results for input(s): CHOL, HDL, LDLCALC, TRIG, CHOLHDL, LDLDIRECT in the last 72 hours.  Thyroid Function Tests: No results for input(s): TSH, T4TOTAL, FREET4, T3FREE, THYROIDAB in the last 72 hours. Anemia Panel: No results for input(s): VITAMINB12, FOLATE, FERRITIN, TIBC, IRON, RETICCTPCT in the last 72 hours. Sepsis Labs: Recent Labs  Lab 08/15/21 1245  PROCALCITON 0.15    Recent Results (from the past 240 hour(s))  Blood culture (routine x 2)     Status: None   Collection Time: 08/09/21 10:47 PM   Specimen: BLOOD RIGHT HAND  Result Value Ref Range Status   Specimen Description BLOOD RIGHT HAND  Final   Special Requests   Final    BOTTLES DRAWN AEROBIC AND ANAEROBIC Blood Culture adequate volume   Culture   Final    NO GROWTH 5 DAYS Performed at [facility], [address] [place], [place] [address]    Report Status 08/14/2021 FINAL  Final   Urine Culture     Status: None   Collection Time: 08/09/21 10:47 PM   Specimen: Urine, Catheterized  Result Value Ref Range Status   Specimen Description   Final    URINE, CATHETERIZED Performed at [facility], [address] [place], [place] [address]    Special Requests   Final    NONE Performed at [facility], [address], [place], [place] [address]    Culture   Final    NO GROWTH Performed at [facility], [place] [address], [place], [place] [address]    Report Status 08/11/2021 FINAL  Final  Resp Panel by RT-PCR (Flu A&B, Covid) Nasopharyngeal Swab     Status: None   Collection Time: 08/09/21 10:48 PM   Specimen: Nasopharyngeal Swab; Nasopharyngeal(NP) swabs in vial transport medium  Result Value Ref Range Status   SARS Coronavirus 2 by RT PCR NEGATIVE NEGATIVE Final    Comment: (NOTE) SARS-CoV-2 target nucleic acids are NOT DETECTED.  The SARS-CoV-2 RNA is generally detectable in upper respiratory specimens during the acute  phase of infection. The lowest concentration of SARS-CoV-2 viral copies this assay can detect is 138 copies/mL. A negative result does not preclude SARS-Cov-2 infection and should not be used as the sole basis for treatment or other patient management decisions. A negative result may occur with  improper specimen collection/handling, submission of specimen other than nasopharyngeal swab, presence of viral mutation(s) within the areas targeted by this assay, and inadequate number of viral copies(<138 copies/mL). A negative result must be combined with clinical observations, patient history, and epidemiological information. The expected result is Negative.  Fact Sheet for Patients:  EntrepreneurPulse.com.au  Fact Sheet for Healthcare Providers:  IncredibleEmployment.be  This test is no t yet approved or cleared by the [place] FDA and  has been authorized for detection and/or  diagnosis of SARS-CoV-2 by FDA under an Emergency Use Authorization (EUA). This EUA will remain  in effect (meaning this test can be used) for the duration of the COVID-19 declaration under Section 564(b)(1) of the Act, 21 U.S.C.section 360bbb-3(b)(1), unless the authorization is terminated  or revoked sooner.       Influenza A by PCR NEGATIVE NEGATIVE Final   Influenza B by PCR NEGATIVE NEGATIVE Final    Comment: (NOTE) The Xpert Xpress SARS-CoV-2/FLU/RSV plus assay is intended as an aid in the diagnosis of influenza from Nasopharyngeal swab specimens and should not be used as a sole basis for treatment. Nasal washings and aspirates are unacceptable for Xpert Xpress SARS-CoV-2/FLU/RSV testing.  Fact Sheet for Patients: EntrepreneurPulse.com.au  Fact Sheet for Healthcare Providers: IncredibleEmployment.be  This test is not yet approved or cleared by the [place] FDA and has been authorized for detection and/or diagnosis of SARS-CoV-2 by FDA under an Emergency Use Authorization (EUA). This EUA will remain in effect (meaning this test can be used) for the duration of the COVID-19 declaration under Section 564(b)(1) of the Act, 21 U.S.C. section 360bbb-3(b)(1), unless the authorization is terminated or revoked.  Performed at [facility], [address] [place], [place] [address]   Blood culture (routine x 2)     Status: None   Collection Time: 08/09/21 11:21 PM   Specimen: BLOOD LEFT HAND  Result Value Ref Range Status   Specimen Description BLOOD LEFT HAND  Final   Special Requests   Final    PEDIATRICS Blood Culture results may not be optimal due to an excessive volume of blood received in culture bottles   Culture   Final    NO GROWTH 5 DAYS Performed at [facility], [address] [place], [place] [address]    Report Status 08/14/2021 FINAL  Final  CULTURE, BLOOD (ROUTINE X 2) w Reflex to ID Panel      Status: None (Preliminary result)   Collection Time: 08/15/21 12:45 PM   Specimen: BLOOD  Result Value Ref Range Status   Specimen Description BLOOD LEFT HAND  Final   Special Requests   Final    BOTTLES DRAWN AEROBIC AND ANAEROBIC Blood Culture adequate volume   Culture   Final    NO GROWTH 3 DAYS Performed at [facility], [address], [place], [place] [address]    Report Status PENDING  Incomplete  CULTURE, BLOOD (ROUTINE X 2) w Reflex to ID Panel     Status: None (Preliminary result)   Collection Time: 08/15/21 12:46 PM   Specimen: BLOOD  Result Value Ref Range Status   Specimen Description BLOOD RIGHT HAND  Final   Special Requests   Final  BOTTLES DRAWN AEROBIC AND ANAEROBIC Blood Culture adequate volume   Culture   Final    NO GROWTH 3 DAYS Performed at [facility], [address] [place], [place] [address]    Report Status PENDING  Incomplete  Resp Panel by RT-PCR (Flu A&B, Covid) Nasopharyngeal Swab     Status: None   Collection Time: 08/16/21  4:18 PM   Specimen: Nasopharyngeal Swab; Nasopharyngeal(NP) swabs in vial transport medium  Result Value Ref Range Status   SARS Coronavirus 2 by RT PCR NEGATIVE NEGATIVE Final    Comment: (NOTE) SARS-CoV-2 target nucleic acids are NOT DETECTED.  The SARS-CoV-2 RNA is generally detectable in upper respiratory specimens during the acute phase of infection. The lowest concentration of SARS-CoV-2 viral copies this assay can detect is 138 copies/mL. A negative result does not preclude SARS-Cov-2 infection and should not be used as the sole basis for treatment or other patient management decisions. A negative result may occur with  improper specimen collection/handling, submission of specimen other than nasopharyngeal swab, presence of viral mutation(s) within the areas targeted by this assay, and inadequate number of viral copies(<138 copies/mL). A negative result must be combined with clinical  observations, patient history, and epidemiological information. The expected result is Negative.  Fact Sheet for Patients:  EntrepreneurPulse.com.au  Fact Sheet for Healthcare Providers:  IncredibleEmployment.be  This test is no t yet approved or cleared by the [place] FDA and  has been authorized for detection and/or diagnosis of SARS-CoV-2 by FDA under an Emergency Use Authorization (EUA). This EUA will remain  in effect (meaning this test can be used) for the duration of the COVID-19 declaration under Section 564(b)(1) of the Act, 21 U.S.C.section 360bbb-3(b)(1), unless the authorization is terminated  or revoked sooner.       Influenza A by PCR NEGATIVE NEGATIVE Final   Influenza B by PCR NEGATIVE NEGATIVE Final    Comment: (NOTE) The Xpert Xpress SARS-CoV-2/FLU/RSV plus assay is intended as an aid in the diagnosis of influenza from Nasopharyngeal swab specimens and should not be used as a sole basis for treatment. Nasal washings and aspirates are unacceptable for Xpert Xpress SARS-CoV-2/FLU/RSV testing.  Fact Sheet for Patients: EntrepreneurPulse.com.au  Fact Sheet for Healthcare Providers: IncredibleEmployment.be  This test is not yet approved or cleared by the [place] FDA and has been authorized for detection and/or diagnosis of SARS-CoV-2 by FDA under an Emergency Use Authorization (EUA). This EUA will remain in effect (meaning this test can be used) for the duration of the COVID-19 declaration under Section 564(b)(1) of the Act, 21 U.S.C. section 360bbb-3(b)(1), unless the authorization is terminated or revoked.  Performed at [facility], [place], [place] [address]   Respiratory (~20 pathogens) panel by PCR     Status: None   Collection Time: 08/16/21  4:18 PM   Specimen: Nasopharyngeal Swab; Respiratory  Result Value Ref Range Status   Adenovirus NOT  DETECTED NOT DETECTED Final   Coronavirus 229E NOT DETECTED NOT DETECTED Final    Comment: (NOTE) The Coronavirus on the Respiratory Panel, DOES NOT test for the novel  Coronavirus (2019 nCoV)    Coronavirus HKU1 NOT DETECTED NOT DETECTED Final   Coronavirus NL63 NOT DETECTED NOT DETECTED Final   Coronavirus OC43 NOT DETECTED NOT DETECTED Final   Metapneumovirus NOT DETECTED NOT DETECTED Final   Rhinovirus / Enterovirus NOT DETECTED NOT DETECTED Final   Influenza A NOT DETECTED NOT DETECTED Final   Influenza B NOT DETECTED  NOT DETECTED Final   Parainfluenza Virus 1 NOT DETECTED NOT DETECTED Final   Parainfluenza Virus 2 NOT DETECTED NOT DETECTED Final   Parainfluenza Virus 3 NOT DETECTED NOT DETECTED Final   Parainfluenza Virus 4 NOT DETECTED NOT DETECTED Final   Respiratory Syncytial Virus NOT DETECTED NOT DETECTED Final   Bordetella pertussis NOT DETECTED NOT DETECTED Final   Bordetella Parapertussis NOT DETECTED NOT DETECTED Final   Chlamydophila pneumoniae NOT DETECTED NOT DETECTED Final   Mycoplasma pneumoniae NOT DETECTED NOT DETECTED Final    Comment: Performed at [facility], [place] [address] [place], [place] [address]         Radiology Studies: No results found.      Scheduled Meds:  arformoterol  15 mcg Nebulization BID   vitamin C  500 mg Oral BID   budesonide (PULMICORT) nebulizer solution  0.25 mg Nebulization BID   cefadroxil  1,000 mg Oral BID   Chlorhexidine Gluconate Cloth  6 each Topical Daily   clonazePAM  1 mg Per Tube BID   docusate  100 mg Per Tube BID   DULoxetine  60 mg Oral Daily   furosemide  40 mg Intravenous QODAY   insulin aspart  0-20 Units Subcutaneous Q4H   lidocaine  1 patch Transdermal Q24H   mouth rinse  15 mL Mouth Rinse BID   metoprolol succinate  100 mg Oral Daily   multivitamin with minerals  1 tablet Oral Daily   nystatin   Topical BID   pantoprazole (PROTONIX) IV  40 mg Intravenous QHS   polyethylene glycol  17 g Per  Tube Daily   QUEtiapine  25 mg Oral QHS   rivaroxaban  20 mg Per Tube Daily   valproic acid  500 mg Per Tube TID   Continuous Infusions:     LOS: 8 days    Time spent: 38 minutes spent on chart review, discussion with nursing staff, consultants, updating family and interview/physical exam; more than 50% of that time was spent in counseling and/or coordination of care.    Roseana Rhine J [place] DO [facility] Available via Epic secure chat 7am-7pm After these hours, please refer to coverage provider listed on [email or web address] 08/18/2021, 4:22 PM

## 2021-08-18 NOTE — TOC Progression Note (Signed)
Transition of Care [facility]) - Progression Note    Patient Details  Name: Craig [name] MRN: [id number] Date of Birth: May 12, 1991  Transition of Care [facility]) CM/SW Contact  Shelbie Hutching, RN Phone Number: 08/18/2021, 6:39 PM  Clinical Narrative:    Insurance authorization is still pending for NIV.  [employer] has gotten pulmonologist to fill out form to expedite authorization but [employer] reports this could still take 72 hours.     Expected Discharge Plan:  (TBD) Barriers to Discharge: Equipment Delay  Expected Discharge Plan and Services Expected Discharge Plan:  (TBD)         Expected Discharge Date: 08/17/21               DME Arranged: NIV DME Agency: [employer] Date DME Agency Contacted: 08/17/21 Time DME Agency Contacted: [phone] Representative spoke with at DME Agency: [place]: PT, OT [place] Agency: [place] Date [place]: 08/17/21 Time [place]: 1156 Representative spoke with at [place]: [place]   Social Determinants of Health ([place]) Interventions    Readmission Risk Interventions Readmission Risk Prevention Plan 07/10/2021  Transportation Screening Complete  PCP or Specialist Appt within 3-5 Days Complete  HRI or [place] Complete  Social Work Consult for [place] Planning/Counseling [place] Not Applicable  Medication Review Press photographer) Complete  Some recent data might be hidden

## 2021-08-18 NOTE — Progress Notes (Signed)
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  On arrival to the ED, patient was being bagged by EMS to maintain SPO2 in the low 90s.  He was noted to have an elevated WBC count of 22, tachypneic, tachycardic and hypoxia with concern for sepsis secondary to pneumonia in the setting of seizure activity.  Patient was started on empiric antibiotics and intubated by EDP for severe hypoxic/hypercarbic respite failure in the setting of obesity, OHS, severe deconditioning..  Patient was initially admitted to the critical care service.  Patient was extubated on 08/14/2021.  Patient was transferred to hospital service on 12/27.   Assessment & Plan:   Principal Problem:   Acute on chronic respiratory failure with hypoxia and hypercapnia (HCC)   Acute on chronic hypoxic and hypercarbic respiratory failure, POA Morbid obesity with obesity hypoventilation, severe deconditioning Patient with history of severe Covid-19 pneumonia initially requiring tracheostomy which has subsequently been decannulated, MSSA pneumonia, Morbid obesity, probable OHS/OSA and acute worsening likely secondary to multifactorial etiology from sepsis, OSA, OHS.  Was initially admitted by [facility] service  after being intubated by ED physician.  Was successfully extubated on 08/14/2021 to BiPAP. --Continue BiPAP nightly, social work assisting with NIV for home use --Brovana neb BID --Pulmicort neb BID --Nasal cannula during the day, maintain SPO2 greater than 92% --Aggressive pulmonary hygiene --Awaiting insurance authorization in order for [employer] to deliver trilogy NIV; as without this, patient has a very high bounce back potential given his brittle respiratory status   Sepsis 2/2 MSSA bacteremia with MSSA right knee septic arthritis s/p I&D x2 Patient seen by ID on 12/20. Was to complete IV Cefazolin on 12/22 then plan to transition to Cefadroxil continue IV cefazolin while inpatient.  TEE 07/12/2021 negative.  Now with recurrent fever with T-max 100.9 on 08/15/2021. --Infectious disease following, appreciate assistance --Blood cultures 12/21 with no growth x5 days --Repeat blood cultures 12/27: No growth x3 days --PICC line removed 12/28 --Cefadroxil  Acute metabolic encephalopathy likely multifactorial in the setting of sepsis Baseline issues with anxiety/impulsivity CT Head negative for acute abnormality, Limited MRI negative for acute abnormality. EEG without seizure activity --Depakote 500 mg TID --Klonopin 1 mg BID --Ativan PRN --Avoid fentanyl due to paradoxic agitation   Seizure Like Activity Similar presentation during last admission, Tramadol was previously discontinued due to suspected seizures, it appears that this was restarted as outpatient, now [name]. CT Head shows no acute intracranial abnormality. MRI shows no acute abnormality though limited study. EEG showed no seizure activity. May have been myoclonic activity due to hypoxia. --Depakene 500 mg q8h --Seizure precautions   H/o upper extremity DVT It appears that he was started on Xarelto before around Feb/Mar 2022 for provoked upper extremity DVT. CTA chest neg for PE --Continue Xarelto   Anasarca --net  negative 1.5L past 24h, net negative 2.8L since admission --Furosemide 40 mg IV every other  day   Diabetes mellitus Last HgbA1c 6.8 on 07/08/21.  Diet controlled at home. --Resistant SSI for coverage --CBGs qAC/HS  Sinus tachycardia --Metoprolol succinate 100 mg p.o. daily  Morbid obesity Body mass index is 39.57 kg/m.  Discussed with patient needs for aggressive lifestyle changes/weight loss as this complicates all facets of care.  Outpatient follow-up with PCP.  May benefit from bariatric evaluation outpatient.  Weakness/deconditioning/debility: --PT/OT recommending home health on discharge --Continue therapy efforts while inpatient   DVT prophylaxis:  rivaroxaban (XARELTO) tablet 20 mg    Code Status: Full Code Family Communication: Updated spouse present at bedside this morning  Disposition Plan:  Level of care: Stepdown Status is: Inpatient  Remains inpatient appropriate because: Remains on IV antibiotics, removing PICC line today, awaiting further recommendations per ID.    Consultants:  PCCM -signed off 12/27 Infectious disease  Procedures:  Intubated 12/21 Extubated 12/26 PICC line removal 12/28  Antimicrobials:  Daptomycin 12/27>> Zosyn 12/27 Vancomycin 12/21 -12/22 Metronidazole 12/21 - 12/21 Aztreonam 12/21 - 12/21 Cefazolin 12/22 12/27 Levofloxacin 12/27 - 12/27    Subjective: Patient seen examined bedside, resting comfortably.  Sleeping in bed, easily arousable.  Spouse present.  Patient is ready for discharge home but per social work [employer] still working on [employer] for NIV.  Unsafe discharge until patient receives devices he is a high bounce back potential given his brittle respiratory status.  No other questions or concerns at this time.  Denies headache, no chest pain, no palpitations, no chills/night sweats, no nausea/vomiting/diarrhea, no abdominal pain.  No acute events overnight per nursing staff.  Objective: Vitals:    08/18/21 1000 08/18/21 1100 08/18/21 1200 08/18/21 1300  BP: 115/79 130/70 128/76 (!) 146/85  Pulse: (!) 103 99 98 100  Resp: (!) 23 (!) 25 (!) 25 (!) 31  Temp:   98.4 F (36.9 C)   TempSrc:      SpO2: 96% 92% 94% 96%  Weight:      Height:        Intake/Output Summary (Last 24 hours) at 08/18/2021 1624 Last data filed at 08/18/2021 1200 Gross per 24 hour  Intake 1193.08 ml  Output 2610 ml  Net -1416.92 ml   Filed Weights   08/16/21 0323 08/17/21 0414 08/18/21 0402  Weight: (!) 138 kg 134.4 kg 136 kg    Examination:  General exam: Appears calm and comfortable, obese, chronically ill appearance, appears older than stated age Respiratory system: Breath sounds slightly decreased bilateral bases, normal [place] effort, on 2 L nasal cannula with SPO2 96% at rest. Cardiovascular system: S1 & S2 heard, RRR. No JVD, murmurs, rubs, gallops or clicks. No pedal edema. Gastrointestinal system: Abdomen is nondistended, soft and nontender. No organomegaly or masses felt. Normal bowel sounds heard.  Ostomy noted with brown stool in collection bag. Central nervous system: Alert and oriented. No focal neurological deficits. Extremities: Symmetric 5 x 5 power. Skin: No rashes, lesions or ulcers Psychiatry: Judgement and insight appear normal. Mood & affect appropriate.     Data Reviewed: I have personally reviewed following labs and imaging studies  CBC: Recent Labs  Lab 08/13/21 0430 08/14/21 0345 08/15/21 0500 08/16/21 0521 08/17/21 0546  WBC 5.1 6.1 8.2 7.0 9.0  NEUTROABS  --   --   --  4.4  --   HGB 8.7* 8.9* 9.1* 8.7* 8.7*  HCT 29.8* 31.0* 30.7* 30.2* 30.0*  MCV 93.4 95.4 91.9 94.1 94.3  PLT 176 200 250 247 517   Basic Metabolic  Panel: Recent Labs  Lab 08/12/21 0618 08/13/21 0430 08/14/21 0345 08/14/21 1222 08/15/21 0500 08/16/21 0521 08/17/21 0546  NA 140 142 141 143 138 137 137  K 3.8 2.8* 3.0* 3.6 3.2* 4.0 5.0  CL 100 98 96* 95* 92* 91* 92*  CO2 36* 35* 36*  38* 38* 37* 35*  GLUCOSE 164* 183* 153* 102* 91 117* 135*  BUN 11 15 17 13 8 6  <5*  CREATININE 0.46* 0.31* 0.43* 0.41* 0.41* 0.32* 0.42*  CALCIUM 9.2 9.4 9.3 9.6 9.2 9.4 9.7  MG 1.7 1.9 1.4* 2.1 1.6* 1.7 1.5*  PHOS 4.9* 4.6 4.8*  --  5.4* 5.0*  --    GFR: Estimated Creatinine Clearance: 191.8 mL/min (A) (by C-G formula based on SCr of 0.42 mg/dL (L)). Liver Function Tests: No results for input(s): AST, ALT, ALKPHOS, BILITOT, PROT, ALBUMIN in the last 168 hours.  No results for input(s): LIPASE, AMYLASE in the last 168 hours. No results for input(s): AMMONIA in the last 168 hours. Coagulation Profile: No results for input(s): INR, PROTIME in the last 168 hours.  Cardiac Enzymes: Recent Labs  Lab 08/15/21 1255  CKTOTAL 56   BNP (last 3 results) No results for input(s): PROBNP in the last 8760 hours. HbA1C: No results for input(s): HGBA1C in the last 72 hours. CBG: Recent Labs  Lab 08/17/21 2321 08/18/21 0344 08/18/21 0716 08/18/21 1112 08/18/21 1533  GLUCAP 147* 137* 134* 130* 204*   Lipid Profile: No results for input(s): CHOL, HDL, LDLCALC, TRIG, CHOLHDL, LDLDIRECT in the last 72 hours.  Thyroid Function Tests: No results for input(s): TSH, T4TOTAL, FREET4, T3FREE, THYROIDAB in the last 72 hours. Anemia Panel: No results for input(s): VITAMINB12, FOLATE, FERRITIN, TIBC, IRON, RETICCTPCT in the last 72 hours. Sepsis Labs: Recent Labs  Lab 08/15/21 1245  PROCALCITON 0.15    Recent Results (from the past 240 hour(s))  Blood culture (routine x 2)     Status: None   Collection Time: 08/09/21 10:47 PM   Specimen: BLOOD RIGHT HAND  Result Value Ref Range Status   Specimen Description BLOOD RIGHT HAND  Final   Special Requests   Final    BOTTLES DRAWN AEROBIC AND ANAEROBIC Blood Culture adequate volume   Culture   Final    NO GROWTH 5 DAYS Performed at [facility], [address] [place], [place] [address]    Report Status 08/14/2021 FINAL  Final   Urine Culture     Status: None   Collection Time: 08/09/21 10:47 PM   Specimen: Urine, Catheterized  Result Value Ref Range Status   Specimen Description   Final    URINE, CATHETERIZED Performed at [facility], [address], [place], [place] [address]    Special Requests   Final    NONE Performed at [facility], [address] [place], [place] [address]    Culture   Final    NO GROWTH Performed at [facility], [place] [address], [place], [place] [address]    Report Status 08/11/2021 FINAL  Final  Resp Panel by RT-PCR (Flu A&B, Covid) Nasopharyngeal Swab     Status: None   Collection Time: 08/09/21 10:48 PM   Specimen: Nasopharyngeal Swab; Nasopharyngeal(NP) swabs in vial transport medium  Result Value Ref Range Status   SARS Coronavirus 2 by RT PCR NEGATIVE NEGATIVE Final    Comment: (NOTE) SARS-CoV-2 target nucleic acids are NOT DETECTED.  The SARS-CoV-2 RNA is generally detectable in upper respiratory specimens during  the acute phase of infection. The lowest concentration of SARS-CoV-2 viral copies this assay can detect is 138 copies/mL. A negative result does not preclude SARS-Cov-2 infection and should not be used as the sole basis for treatment or other patient management decisions. A negative result may occur with  improper specimen collection/handling, submission of specimen other than nasopharyngeal swab, presence of viral mutation(s) within the areas targeted by this assay, and inadequate number of viral copies(<138 copies/mL). A negative result must be combined with clinical observations, patient history, and epidemiological information. The expected result is Negative.  Fact Sheet for Patients:  EntrepreneurPulse.com.au  Fact Sheet for Healthcare Providers:  IncredibleEmployment.be  This test is no t yet approved or cleared by the [place] FDA and  has been authorized for detection and/or  diagnosis of SARS-CoV-2 by FDA under an Emergency Use Authorization (EUA). This EUA will remain  in effect (meaning this test can be used) for the duration of the COVID-19 declaration under Section 564(b)(1) of the Act, 21 U.S.C.section 360bbb-3(b)(1), unless the authorization is terminated  or revoked sooner.       Influenza A by PCR NEGATIVE NEGATIVE Final   Influenza B by PCR NEGATIVE NEGATIVE Final    Comment: (NOTE) The Xpert Xpress SARS-CoV-2/FLU/RSV plus assay is intended as an aid in the diagnosis of influenza from Nasopharyngeal swab specimens and should not be used as a sole basis for treatment. Nasal washings and aspirates are unacceptable for Xpert Xpress SARS-CoV-2/FLU/RSV testing.  Fact Sheet for Patients: EntrepreneurPulse.com.au  Fact Sheet for Healthcare Providers: IncredibleEmployment.be  This test is not yet approved or cleared by the [place] FDA and has been authorized for detection and/or diagnosis of SARS-CoV-2 by FDA under an Emergency Use Authorization (EUA). This EUA will remain in effect (meaning this test can be used) for the duration of the COVID-19 declaration under Section 564(b)(1) of the Act, 21 U.S.C. section 360bbb-3(b)(1), unless the authorization is terminated or revoked.  Performed at [facility], [address] [place], [place] [address]   Blood culture (routine x 2)     Status: None   Collection Time: 08/09/21 11:21 PM   Specimen: BLOOD LEFT HAND  Result Value Ref Range Status   Specimen Description BLOOD LEFT HAND  Final   Special Requests   Final    PEDIATRICS Blood Culture results may not be optimal due to an excessive volume of blood received in culture bottles   Culture   Final    NO GROWTH 5 DAYS Performed at [facility], [address] [place], [place] [address]    Report Status 08/14/2021 FINAL  Final  CULTURE, BLOOD (ROUTINE X 2) w Reflex to ID Panel      Status: None (Preliminary result)   Collection Time: 08/15/21 12:45 PM   Specimen: BLOOD  Result Value Ref Range Status   Specimen Description BLOOD LEFT HAND  Final   Special Requests   Final    BOTTLES DRAWN AEROBIC AND ANAEROBIC Blood Culture adequate volume   Culture   Final    NO GROWTH 3 DAYS Performed at [facility], [address] [place], [place] [address]    Report Status PENDING  Incomplete  CULTURE, BLOOD (ROUTINE X 2) w Reflex to ID Panel     Status: None (Preliminary result)   Collection Time: 08/15/21 12:46 PM   Specimen: BLOOD  Result Value Ref Range Status   Specimen Description BLOOD RIGHT HAND  Final   Special Requests  Final    BOTTLES DRAWN AEROBIC AND ANAEROBIC Blood Culture adequate volume   Culture   Final    NO GROWTH 3 DAYS Performed at [facility], [address] [place], [place] [address]    Report Status PENDING  Incomplete  Resp Panel by RT-PCR (Flu A&B, Covid) Nasopharyngeal Swab     Status: None   Collection Time: 08/16/21  4:18 PM   Specimen: Nasopharyngeal Swab; Nasopharyngeal(NP) swabs in vial transport medium  Result Value Ref Range Status   SARS Coronavirus 2 by RT PCR NEGATIVE NEGATIVE Final    Comment: (NOTE) SARS-CoV-2 target nucleic acids are NOT DETECTED.  The SARS-CoV-2 RNA is generally detectable in upper respiratory specimens during the acute phase of infection. The lowest concentration of SARS-CoV-2 viral copies this assay can detect is 138 copies/mL. A negative result does not preclude SARS-Cov-2 infection and should not be used as the sole basis for treatment or other patient management decisions. A negative result may occur with  improper specimen collection/handling, submission of specimen other than nasopharyngeal swab, presence of viral mutation(s) within the areas targeted by this assay, and inadequate number of viral copies(<138 copies/mL). A negative result must be combined with clinical  observations, patient history, and epidemiological information. The expected result is Negative.  Fact Sheet for Patients:  EntrepreneurPulse.com.au  Fact Sheet for Healthcare Providers:  IncredibleEmployment.be  This test is no t yet approved or cleared by the [place] FDA and  has been authorized for detection and/or diagnosis of SARS-CoV-2 by FDA under an Emergency Use Authorization (EUA). This EUA will remain  in effect (meaning this test can be used) for the duration of the COVID-19 declaration under Section 564(b)(1) of the Act, 21 U.S.C.section 360bbb-3(b)(1), unless the authorization is terminated  or revoked sooner.       Influenza A by PCR NEGATIVE NEGATIVE Final   Influenza B by PCR NEGATIVE NEGATIVE Final    Comment: (NOTE) The Xpert Xpress SARS-CoV-2/FLU/RSV plus assay is intended as an aid in the diagnosis of influenza from Nasopharyngeal swab specimens and should not be used as a sole basis for treatment. Nasal washings and aspirates are unacceptable for Xpert Xpress SARS-CoV-2/FLU/RSV testing.  Fact Sheet for Patients: EntrepreneurPulse.com.au  Fact Sheet for Healthcare Providers: IncredibleEmployment.be  This test is not yet approved or cleared by the [place] FDA and has been authorized for detection and/or diagnosis of SARS-CoV-2 by FDA under an Emergency Use Authorization (EUA). This EUA will remain in effect (meaning this test can be used) for the duration of the COVID-19 declaration under Section 564(b)(1) of the Act, 21 U.S.C. section 360bbb-3(b)(1), unless the authorization is terminated or revoked.  Performed at [facility], [place], [place] [address]   Respiratory (~20 pathogens) panel by PCR     Status: None   Collection Time: 08/16/21  4:18 PM   Specimen: Nasopharyngeal Swab; Respiratory  Result Value Ref Range Status   Adenovirus NOT  DETECTED NOT DETECTED Final   Coronavirus 229E NOT DETECTED NOT DETECTED Final    Comment: (NOTE) The Coronavirus on the Respiratory Panel, DOES NOT test for the novel  Coronavirus (2019 nCoV)    Coronavirus HKU1 NOT DETECTED NOT DETECTED Final   Coronavirus NL63 NOT DETECTED NOT DETECTED Final   Coronavirus OC43 NOT DETECTED NOT DETECTED Final   Metapneumovirus NOT DETECTED NOT DETECTED Final   Rhinovirus / Enterovirus NOT DETECTED NOT DETECTED Final   Influenza A NOT DETECTED NOT DETECTED Final  Influenza B NOT DETECTED NOT DETECTED Final   Parainfluenza Virus 1 NOT DETECTED NOT DETECTED Final   Parainfluenza Virus 2 NOT DETECTED NOT DETECTED Final   Parainfluenza Virus 3 NOT DETECTED NOT DETECTED Final   Parainfluenza Virus 4 NOT DETECTED NOT DETECTED Final   Respiratory Syncytial Virus NOT DETECTED NOT DETECTED Final   Bordetella pertussis NOT DETECTED NOT DETECTED Final   Bordetella Parapertussis NOT DETECTED NOT DETECTED Final   Chlamydophila pneumoniae NOT DETECTED NOT DETECTED Final   Mycoplasma pneumoniae NOT DETECTED NOT DETECTED Final    Comment: Performed at [facility], [place] [address], [place], [place] [address]         Radiology Studies: No results found.      Scheduled Meds:  arformoterol  15 mcg Nebulization BID   vitamin C  500 mg Oral BID   budesonide (PULMICORT) nebulizer solution  0.25 mg Nebulization BID   cefadroxil  1,000 mg Oral BID   Chlorhexidine Gluconate Cloth  6 each Topical Daily   clonazePAM  1 mg Per Tube BID   docusate  100 mg Per Tube BID   DULoxetine  60 mg Oral Daily   furosemide  40 mg Intravenous QODAY   insulin aspart  0-20 Units Subcutaneous Q4H   lidocaine  1 patch Transdermal Q24H   mouth rinse  15 mL Mouth Rinse BID   metoprolol succinate  100 mg Oral Daily   multivitamin with minerals  1 tablet Oral Daily   nystatin   Topical BID   pantoprazole (PROTONIX) IV  40 mg Intravenous QHS   polyethylene glycol  17 g Per  Tube Daily   QUEtiapine  25 mg Oral QHS   rivaroxaban  20 mg Per Tube Daily   valproic acid  500 mg Per Tube TID   Continuous Infusions:     LOS: 8 days    Time spent: 38 minutes spent on chart review, discussion with nursing staff, consultants, updating family and interview/physical exam; more than 50% of that time was spent in counseling and/or coordination of care.    Kermit Arnette J [place] DO [facility] Available via Epic secure chat 7am-7pm After these hours, please refer to coverage provider listed on [email or web address] 08/18/2021, 4:24 PM

## 2021-08-19 ENCOUNTER — Other Ambulatory Visit: Payer: Self-pay

## 2021-08-19 DIAGNOSIS — J9622 Acute and chronic respiratory failure with hypercapnia: Secondary | ICD-10-CM | POA: Diagnosis not present

## 2021-08-19 DIAGNOSIS — J9621 Acute and chronic respiratory failure with hypoxia: Secondary | ICD-10-CM | POA: Diagnosis not present

## 2021-08-19 LAB — GLUCOSE, CAPILLARY
Glucose-Capillary: 140 mg/dL — ABNORMAL HIGH (ref 70–99)
Glucose-Capillary: 137 mg/dL — ABNORMAL HIGH (ref 70–99)
Glucose-Capillary: 176 mg/dL — ABNORMAL HIGH (ref 70–99)
Glucose-Capillary: 160 mg/dL — ABNORMAL HIGH (ref 70–99)
Glucose-Capillary: 138 mg/dL — ABNORMAL HIGH (ref 70–99)
Glucose-Capillary: 178 mg/dL — ABNORMAL HIGH (ref 70–99)

## 2021-08-19 NOTE — TOC Progression Note (Signed)
Transition of Care [facility]) - Progression Note    Patient Details  Name: Craig [name] MRN: [id number] Date of Birth: 10-29-1990  Transition of Care [facility]) CM/SW [place], RN Phone Number: 08/19/2021, 1:58 PM  Clinical Narrative:    No updates from insurance auth related to NIV.    Expected Discharge Plan:  (TBD) Barriers to Discharge: Equipment Delay  Expected Discharge Plan and Services Expected Discharge Plan:  (TBD)         Expected Discharge Date: 08/17/21               DME Arranged: NIV DME Agency: [employer] Date DME Agency Contacted: 08/17/21 Time DME Agency Contacted: [phone] Representative spoke with at DME Agency: [place]: PT, OT [place] Agency: [place] Date [place]: 08/17/21 Time [place]: 1156 Representative spoke with at [place]: [place]   Social Determinants of Health ([place]) Interventions    Readmission Risk Interventions Readmission Risk Prevention Plan 07/10/2021  Transportation Screening Complete  PCP or Specialist Appt within 3-5 Days Complete  HRI or [place] Complete  Social Work Consult for [place] Planning/Counseling [place] Not Applicable  Medication Review Press photographer) Complete  Some recent data might be hidden

## 2021-08-19 NOTE — Plan of Care (Signed)
Discussed with patient and wife plan of care for the evening, pain management and medications with some teach back displayed  Problem: Education: Goal: Knowledge of General Education information will improve Description: Including pain rating scale, medication(s)/side effects and non-pharmacologic comfort measures Outcome: Progressing   Problem: Health Behavior/Discharge Planning: Goal: Ability to manage health-related needs will improve Outcome: Progressing

## 2021-08-19 NOTE — Progress Notes (Signed)
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  On arrival to the ED, patient was being bagged by EMS to maintain SPO2 in the low 90s.  He was noted to have an elevated WBC count of 22, tachypneic, tachycardic and hypoxia with concern for sepsis secondary to pneumonia in the setting of seizure activity.  Patient was started on empiric antibiotics and intubated by EDP for severe hypoxic/hypercarbic respite failure in the setting of obesity, OHS, severe deconditioning..  Patient was initially admitted to the critical care service.  Patient was extubated on 08/14/2021.  Patient was transferred to hospital service on 12/27.   Assessment & Plan:   Principal Problem:   Acute on chronic respiratory failure with hypoxia and hypercapnia (HCC)   Acute on chronic hypoxic and hypercarbic respiratory failure, POA Morbid obesity with obesity hypoventilation, severe deconditioning Patient with history of severe Covid-19 pneumonia initially requiring tracheostomy which has subsequently been decannulated, MSSA pneumonia, Morbid obesity, probable OHS/OSA and acute worsening likely secondary to multifactorial etiology from sepsis, OSA, OHS.  Was initially admitted by [facility] service  after being intubated by ED physician.  Was successfully extubated on 08/14/2021 to BiPAP. --Continue BiPAP nightly, social work assisting with NIV for home use --Brovana neb BID --Pulmicort neb BID --Nasal cannula during the day, maintain SPO2 greater than 92% --Aggressive pulmonary hygiene --Awaiting insurance authorization in order for [employer] to deliver trilogy NIV; as without this, patient has a very high bounce back potential given his brittle respiratory status   Sepsis 2/2 MSSA bacteremia with MSSA right knee septic arthritis s/p I&D x2 Patient seen by ID on 12/20. Was to complete IV Cefazolin on 12/22 then plan to transition to Cefadroxil continue IV cefazolin while inpatient.  TEE 07/12/2021 negative.  Now with recurrent fever with T-max 100.9 on 08/15/2021. --Infectious disease following, appreciate assistance --Blood cultures 12/21 with no growth x5 days --Repeat blood cultures 12/27: No growth x 4 days --PICC line removed 12/28 --Cefadroxil 1 g p.o. twice daily until 09/11/21 ID  Acute metabolic encephalopathy likely multifactorial in the setting of sepsis Baseline issues with anxiety/impulsivity CT Head negative for acute abnormality, Limited MRI negative for acute abnormality. EEG without seizure activity --Depakote 500 mg TID --Klonopin 1 mg BID --Ativan PRN --Avoid fentanyl due to paradoxic agitation   Seizure Like Activity Similar presentation during last admission, Tramadol was previously discontinued due to suspected seizures, it appears that this was restarted as outpatient, now [place]. CT Head shows no acute intracranial abnormality. MRI shows no acute abnormality though limited study. EEG showed no seizure activity. May have been myoclonic activity due to hypoxia. --Depakene 500 mg q8h --Seizure precautions   H/o upper extremity DVT It appears that he was started on Xarelto before around Feb/Mar 2022 for provoked upper extremity DVT. CTA chest neg for  PE --Continue Xarelto   Anasarca --net negative 1.5L past 24h, net negative  2.8L since admission --Furosemide 40 mg IV every other day   Diabetes mellitus Last HgbA1c 6.8 on 07/08/21.  Diet controlled at home. --Resistant SSI for coverage --CBGs qAC/HS  Sinus tachycardia --Metoprolol succinate 100 mg p.o. daily  Morbid obesity Body mass index is 40.5 kg/m.  Discussed with patient needs for aggressive lifestyle changes/weight loss as this complicates all facets of care.  Outpatient follow-up with PCP.  May benefit from bariatric evaluation outpatient.  Weakness/deconditioning/debility: --PT/OT recommending home health on discharge --Continue therapy efforts while inpatient   DVT prophylaxis:  rivaroxaban (XARELTO) tablet 20 mg    Code Status: Full Code Family Communication: Updated spouse present at bedside this morning  Disposition Plan:  Level of care: Stepdown Status is: Inpatient  Remains inpatient appropriate because: Remains on IV antibiotics, removing PICC line today, awaiting further recommendations per ID.    Consultants:  PCCM -signed off 12/27 Infectious disease  Procedures:  Intubated 12/21 Extubated 12/26 PICC line removal 12/28  Antimicrobials:  Daptomycin 12/27>> Zosyn 12/27 Vancomycin 12/21 -12/22 Metronidazole 12/21 - 12/21 Aztreonam 12/21 - 12/21 Cefazolin 12/22 12/27 Levofloxacin 12/27 - 12/27    Subjective: Patient seen examined bedside, resting comfortably.  Sleeping in bed, easily arousable.  Spouse present.  Awaiting insurance authorization for [employer] to bring home NIV to patient for discharge.  Unsafe discharge until patient receives devices he is a high bounce back potential given his brittle respiratory status.  No other questions or concerns at this time.  Denies headache, no chest pain, no palpitations, no chills/night sweats, no nausea/vomiting/diarrhea, no abdominal pain.  No acute events overnight per nursing  staff.  Objective: Vitals:   08/19/21 0735 08/19/21 0800 08/19/21 1100 08/19/21 1500  BP:  (!) 117/58 (!) 100/56 115/61  Pulse: 97 100 (!) 105 100  Resp: (!) 24 (!) 22 (!) 29 (!) 24  Temp:  98.4 F (36.9 C)    TempSrc:  Oral    SpO2: 96% 98% 90% 96%  Weight:      Height:        Intake/Output Summary (Last 24 hours) at 08/19/2021 1515 Last data filed at 08/19/2021 1100 Gross per 24 hour  Intake --  Output 4200 ml  Net -4200 ml   Filed Weights   08/17/21 0414 08/18/21 0402 08/19/21 0438  Weight: 134.4 kg 136 kg (!) 139.2 kg    Examination:  General exam: Appears calm and comfortable, obese, chronically ill appearance, appears older than stated age Respiratory system: Breath sounds slightly decreased bilateral bases, normal [place]paire effort, on 2 L nasal cannula with SPO2 96% at rest. Cardiovascular system: S1 & S2 heard, RRR. No JVD, murmurs, rubs, gallops or clicks. No pedal edema. Gastrointestinal system: Abdomen is nondistended, soft and nontender. No organomegaly or masses felt. Normal bowel sounds heard.  Ostomy noted with brown stool in collection bag. Central nervous system: Alert and oriented. No focal neurological deficits. Extremities: Symmetric 5 x 5 power. Skin: No rashes, lesions or ulcers Psychiatry: Judgement and insight appear normal. Mood & affect appropriate.     Data Reviewed: I have personally reviewed following labs and imaging studies  CBC: Recent Labs  Lab 08/13/21 0430 08/14/21 0345 08/15/21 0500 08/16/21 0521 08/17/21 0546  WBC 5.1 6.1 8.2 7.0 9.0  NEUTROABS  --   --   --  4.4  --   HGB 8.7* 8.9* 9.1* 8.7* 8.7*  HCT 29.8* 31.0* 30.7* 30.2* 30.0*  MCV 93.4 95.4 91.9 94.1 94.3  PLT 176 200 250 247 279  Basic Metabolic Panel: Recent Labs  Lab 08/13/21 0430 08/14/21 0345 08/14/21 1222 08/15/21 0500 08/16/21 0521 08/17/21 0546  NA 142 141 143 138 137 137  K 2.8* 3.0* 3.6 3.2* 4.0 5.0  CL 98 96* 95* 92* 91* 92*  CO2 35* 36* 38*  38* 37* 35*  GLUCOSE 183* 153* 102* 91 117* 135*  BUN 15 17 13 8 6  <5*  CREATININE 0.31* 0.43* 0.41* 0.41* 0.32* 0.42*  CALCIUM 9.4 9.3 9.6 9.2 9.4 9.7  MG 1.9 1.4* 2.1 1.6* 1.7 1.5*  PHOS 4.6 4.8*  --  5.4* 5.0*  --    GFR: Estimated Creatinine Clearance: 194.3 mL/min (A) (by C-G formula based on SCr of 0.42 mg/dL (L)). Liver Function Tests: No results for input(s): AST, ALT, ALKPHOS, BILITOT, PROT, ALBUMIN in the last 168 hours.  No results for input(s): LIPASE, AMYLASE in the last 168 hours. No results for input(s): AMMONIA in the last 168 hours. Coagulation Profile: No results for input(s): INR, PROTIME in the last 168 hours.  Cardiac Enzymes: Recent Labs  Lab 08/15/21 1255  CKTOTAL 56   BNP (last 3 results) No results for input(s): PROBNP in the last 8760 hours. HbA1C: No results for input(s): HGBA1C in the last 72 hours. CBG: Recent Labs  Lab 08/18/21 1936 08/18/21 2342 08/19/21 0335 08/19/21 0725 08/19/21 1112  GLUCAP 160* 142* 140* 137* 176*   Lipid Profile: No results for input(s): CHOL, HDL, LDLCALC, TRIG, CHOLHDL, LDLDIRECT in the last 72 hours.  Thyroid Function Tests: No results for input(s): TSH, T4TOTAL, FREET4, T3FREE, THYROIDAB in the last 72 hours. Anemia Panel: No results for input(s): VITAMINB12, FOLATE, FERRITIN, TIBC, IRON, RETICCTPCT in the last 72 hours. Sepsis Labs: Recent Labs  Lab 08/15/21 1245  PROCALCITON 0.15    Recent Results (from the past 240 hour(s))  Blood culture (routine x 2)     Status: None   Collection Time: 08/09/21 10:47 PM   Specimen: BLOOD RIGHT HAND  Result Value Ref Range Status   Specimen Description BLOOD RIGHT HAND  Final   Special Requests   Final    BOTTLES DRAWN AEROBIC AND ANAEROBIC Blood Culture adequate volume   Culture   Final    NO GROWTH 5 DAYS Performed at [facility], [address] [place], [place] [address]    Report Status 08/14/2021 FINAL  Final  Urine Culture     Status: None    Collection Time: 08/09/21 10:47 PM   Specimen: Urine, Catheterized  Result Value Ref Range Status   Specimen Description   Final    URINE, CATHETERIZED Performed at [facility], [address] [place], [place] [address]    Special Requests   Final    NONE Performed at [facility], [address], [place], [place] [address]    Culture   Final    NO GROWTH Performed at [facility], [place] [address], [place], [place] [address]    Report Status 08/11/2021 FINAL  Final  Resp Panel by RT-PCR (Flu A&B, Covid) Nasopharyngeal Swab     Status: None   Collection Time: 08/09/21 10:48 PM   Specimen: Nasopharyngeal Swab; Nasopharyngeal(NP) swabs in vial transport medium  Result Value Ref Range Status   SARS Coronavirus 2 by RT PCR NEGATIVE NEGATIVE Final    Comment: (NOTE) SARS-CoV-2 target nucleic acids are NOT DETECTED.  The SARS-CoV-2 RNA is generally detectable in upper respiratory specimens during the acute phase of infection. The lowest concentration of SARS-CoV-2  viral copies this assay can detect is 138 copies/mL. A negative result does not preclude SARS-Cov-2 infection and should not be used as the sole basis for treatment or other patient management decisions. A negative result may occur with  improper specimen collection/handling, submission of specimen other than nasopharyngeal swab, presence of viral mutation(s) within the areas targeted by this assay, and inadequate number of viral copies(<138 copies/mL). A negative result must be combined with clinical observations, patient history, and epidemiological information. The expected result is Negative.  Fact Sheet for Patients:  EntrepreneurPulse.com.au  Fact Sheet for Healthcare Providers:  IncredibleEmployment.be  This test is no t yet approved or cleared by the [place] FDA and  has been authorized for detection and/or diagnosis of SARS-CoV-2 by FDA  under an Emergency Use Authorization (EUA). This EUA will remain  in effect (meaning this test can be used) for the duration of the COVID-19 declaration under Section 564(b)(1) of the Act, 21 U.S.C.section 360bbb-3(b)(1), unless the authorization is terminated  or revoked sooner.       Influenza A by PCR NEGATIVE NEGATIVE Final   Influenza B by PCR NEGATIVE NEGATIVE Final    Comment: (NOTE) The Xpert Xpress SARS-CoV-2/FLU/RSV plus assay is intended as an aid in the diagnosis of influenza from Nasopharyngeal swab specimens and should not be used as a sole basis for treatment. Nasal washings and aspirates are unacceptable for Xpert Xpress SARS-CoV-2/FLU/RSV testing.  Fact Sheet for Patients: EntrepreneurPulse.com.au  Fact Sheet for Healthcare Providers: IncredibleEmployment.be  This test is not yet approved or cleared by the [place] FDA and has been authorized for detection and/or diagnosis of SARS-CoV-2 by FDA under an Emergency Use Authorization (EUA). This EUA will remain in effect (meaning this test can be used) for the duration of the COVID-19 declaration under Section 564(b)(1) of the Act, 21 U.S.C. section 360bbb-3(b)(1), unless the authorization is terminated or revoked.  Performed at [facility], [address] [place], [place] [address]   Blood culture (routine x 2)     Status: None   Collection Time: 08/09/21 11:21 PM   Specimen: BLOOD LEFT HAND  Result Value Ref Range Status   Specimen Description BLOOD LEFT HAND  Final   Special Requests   Final    PEDIATRICS Blood Culture results may not be optimal due to an excessive volume of blood received in culture bottles   Culture   Final    NO GROWTH 5 DAYS Performed at [facility], [address] [place], [place] [address]    Report Status 08/14/2021 FINAL  Final  CULTURE, BLOOD (ROUTINE X 2) w Reflex to ID Panel     Status: None (Preliminary result)    Collection Time: 08/15/21 12:45 PM   Specimen: BLOOD  Result Value Ref Range Status   Specimen Description BLOOD LEFT HAND  Final   Special Requests   Final    BOTTLES DRAWN AEROBIC AND ANAEROBIC Blood Culture adequate volume   Culture   Final    NO GROWTH 4 DAYS Performed at [facility], [address] [place], [place] [address]    Report Status PENDING  Incomplete  CULTURE, BLOOD (ROUTINE X 2) w Reflex to ID Panel     Status: None (Preliminary result)   Collection Time: 08/15/21 12:46 PM   Specimen: BLOOD  Result Value Ref Range Status   Specimen Description BLOOD RIGHT HAND  Final   Special Requests   Final    BOTTLES DRAWN AEROBIC AND ANAEROBIC  Blood Culture adequate volume   Culture   Final    NO GROWTH 4 DAYS Performed at [facility], [address] , [place] [address]    Report Status PENDING  Incomplete  Resp Panel by RT-PCR (Flu A&B, Covid) Nasopharyngeal Swab     Status: None   Collection Time: 08/16/21  4:18 PM   Specimen: Nasopharyngeal Swab; Nasopharyngeal(NP) swabs in vial transport medium  Result Value Ref Range Status   SARS Coronavirus 2 by RT PCR NEGATIVE NEGATIVE Final    Comment: (NOTE) SARS-CoV-2 target nucleic acids are NOT DETECTED.  The SARS-CoV-2 RNA is generally detectable in upper respiratory specimens during the acute phase of infection. The lowest concentration of SARS-CoV-2 viral copies this assay can detect is 138 copies/mL. A negative result does not preclude SARS-Cov-2 infection and should not be used as the sole basis for treatment or other patient management decisions. A negative result may occur with  improper specimen collection/handling, submission of specimen other than nasopharyngeal swab, presence of viral mutation(s) within the areas targeted by this assay, and inadequate number of viral copies(<138 copies/mL). A negative result must be combined with clinical observations, patient history, and  epidemiological information. The expected result is Negative.  Fact Sheet for Patients:  EntrepreneurPulse.com.au  Fact Sheet for Healthcare Providers:  IncredibleEmployment.be  This test is no t yet approved or cleared by the [place] FDA and  has been authorized for detection and/or diagnosis of SARS-CoV-2 by FDA under an Emergency Use Authorization (EUA). This EUA will remain  in effect (meaning this test can be used) for the duration of the COVID-19 declaration under Section 564(b)(1) of the Act, 21 U.S.C.section 360bbb-3(b)(1), unless the authorization is terminated  or revoked sooner.       Influenza A by PCR NEGATIVE NEGATIVE Final   Influenza B by PCR NEGATIVE NEGATIVE Final    Comment: (NOTE) The Xpert Xpress SARS-CoV-2/FLU/RSV plus assay is intended as an aid in the diagnosis of influenza from Nasopharyngeal swab specimens and should not be used as a sole basis for treatment. Nasal washings and aspirates are unacceptable for Xpert Xpress SARS-CoV-2/FLU/RSV testing.  Fact Sheet for Patients: EntrepreneurPulse.com.au  Fact Sheet for Healthcare Providers: IncredibleEmployment.be  This test is not yet approved or cleared by the [place] FDA and has been authorized for detection and/or diagnosis of SARS-CoV-2 by FDA under an Emergency Use Authorization (EUA). This EUA will remain in effect (meaning this test can be used) for the duration of the COVID-19 declaration under Section 564(b)(1) of the Act, 21 U.S.C. section 360bbb-3(b)(1), unless the authorization is terminated or revoked.  Performed at [facility], [place],  [address]   Respiratory (~20 pathogens) panel by PCR     Status: None   Collection Time: 08/16/21  4:18 PM   Specimen: Nasopharyngeal Swab; Respiratory  Result Value Ref Range Status   Adenovirus NOT DETECTED NOT DETECTED Final    Coronavirus 229E NOT DETECTED NOT DETECTED Final    Comment: (NOTE) The Coronavirus on the Respiratory Panel, DOES NOT test for the novel  Coronavirus (2019 nCoV)    Coronavirus HKU1 NOT DETECTED NOT DETECTED Final   Coronavirus NL63 NOT DETECTED NOT DETECTED Final   Coronavirus OC43 NOT DETECTED NOT DETECTED Final   Metapneumovirus NOT DETECTED NOT DETECTED Final   Rhinovirus / Enterovirus NOT DETECTED NOT DETECTED Final   Influenza A NOT DETECTED NOT DETECTED Final   Influenza B NOT DETECTED NOT DETECTED Final  Parainfluenza Virus 1 NOT DETECTED NOT DETECTED Final   Parainfluenza Virus 2 NOT DETECTED NOT DETECTED Final   Parainfluenza Virus 3 NOT DETECTED NOT DETECTED Final   Parainfluenza Virus 4 NOT DETECTED NOT DETECTED Final   Respiratory Syncytial Virus NOT DETECTED NOT DETECTED Final   Bordetella pertussis NOT DETECTED NOT DETECTED Final   Bordetella Parapertussis NOT DETECTED NOT DETECTED Final   Chlamydophila pneumoniae NOT DETECTED NOT DETECTED Final   Mycoplasma pneumoniae NOT DETECTED NOT DETECTED Final    Comment: Performed at [facility], [place] [address], [place], [place] [address]         Radiology Studies: No results found.      Scheduled Meds:  arformoterol  15 mcg Nebulization BID   vitamin C  500 mg Oral BID   budesonide (PULMICORT) nebulizer solution  0.25 mg Nebulization BID   cefadroxil  1,000 mg Oral BID   Chlorhexidine Gluconate Cloth  6 each Topical Daily   clonazePAM  1 mg Oral BID   clotrimazole   Topical BID   docusate sodium  100 mg Oral BID   DULoxetine  60 mg Oral Daily   furosemide  40 mg Intravenous QODAY   insulin aspart  0-20 Units Subcutaneous Q4H   lidocaine  1 patch Transdermal Q24H   mouth rinse  15 mL Mouth Rinse BID   metoprolol succinate  100 mg Oral Daily   multivitamin with minerals  1 tablet Oral Daily   nystatin   Topical BID   pantoprazole (PROTONIX) IV  40 mg Intravenous QHS   polyethylene glycol  17 g Oral  Daily   QUEtiapine  25 mg Oral QHS   rivaroxaban  20 mg Oral Daily   valproic acid  500 mg Oral TID   Continuous Infusions:     LOS: 9 days    Time spent: 36 minutes spent on chart review, discussion with nursing staff, consultants, updating family and interview/physical exam; more than 50% of that time was spent in counseling and/or coordination of care.    Temiloluwa Laredo J [place] DO [facility] Available via Epic secure chat 7am-7pm After these hours, please refer to coverage provider listed on [email or web address] 08/19/2021, 3:15 PM

## 2021-08-20 DIAGNOSIS — J9622 Acute and chronic respiratory failure with hypercapnia: Secondary | ICD-10-CM | POA: Diagnosis not present

## 2021-08-20 DIAGNOSIS — J9621 Acute and chronic respiratory failure with hypoxia: Secondary | ICD-10-CM | POA: Diagnosis not present

## 2021-08-20 LAB — BASIC METABOLIC PANEL
Anion gap: 9 (ref 5–15)
BUN: <5 mg/dL — ABNORMAL LOW (ref 6–20)
CO2: 43 mmol/L — ABNORMAL HIGH (ref 22–32)
Calcium: 10 mg/dL (ref 8.9–10.3)
Chloride: 85 mmol/L — ABNORMAL LOW (ref 98–111)
Creatinine, Ser: 0.4 mg/dL — ABNORMAL LOW (ref 0.61–1.24)
GFR, Estimated: >60 mL/min (ref >60)
Glucose, Bld: 131 mg/dL — ABNORMAL HIGH (ref 70–99)
Potassium: 3.5 mmol/L (ref 3.5–5.1)
Sodium: 137 mmol/L (ref 135–145)

## 2021-08-20 LAB — CBC WITH DIFFERENTIAL/PLATELET
Abs Immature Granulocytes: 0.06 10*3/uL (ref 0.00–0.07)
Basophils Absolute: 0 10*3/uL (ref 0.0–0.1)
Basophils Relative: 1 %
Eosinophils Absolute: 0.2 10*3/uL (ref 0.0–0.5)
Eosinophils Relative: 4 %
HCT: 32.6 % — ABNORMAL LOW (ref 39.0–52.0)
Hemoglobin: 9.2 g/dL — ABNORMAL LOW (ref 13.0–17.0)
Immature Granulocytes: 1 %
Lymphocytes Relative: 23 %
Lymphs Abs: 1.4 10*3/uL (ref 0.7–4.0)
MCH: 26.9 pg (ref 26.0–34.0)
MCHC: 28.2 g/dL — ABNORMAL LOW (ref 30.0–36.0)
MCV: 95.3 fL (ref 80.0–100.0)
Monocytes Absolute: 1.1 10*3/uL — ABNORMAL HIGH (ref 0.1–1.0)
Monocytes Relative: 18 %
Neutro Abs: 3.2 10*3/uL (ref 1.7–7.7)
Neutrophils Relative %: 53 %
Platelets: 265 10*3/uL (ref 150–400)
RBC: 3.42 MIL/uL — ABNORMAL LOW (ref 4.22–5.81)
RDW: 17.2 % — ABNORMAL HIGH (ref 11.5–15.5)
WBC: 6 10*3/uL (ref 4.0–10.5)
nRBC: 0 % (ref 0.0–0.2)

## 2021-08-20 LAB — GLUCOSE, CAPILLARY
Glucose-Capillary: 121 mg/dL — ABNORMAL HIGH (ref 70–99)
Glucose-Capillary: 124 mg/dL — ABNORMAL HIGH (ref 70–99)
Glucose-Capillary: 133 mg/dL — ABNORMAL HIGH (ref 70–99)
Glucose-Capillary: 137 mg/dL — ABNORMAL HIGH (ref 70–99)
Glucose-Capillary: 152 mg/dL — ABNORMAL HIGH (ref 70–99)
Glucose-Capillary: 173 mg/dL — ABNORMAL HIGH (ref 70–99)

## 2021-08-20 LAB — CULTURE, BLOOD (ROUTINE X 2)
Culture: NO GROWTH
Culture: NO GROWTH
Special Requests: ADEQUATE
Special Requests: ADEQUATE

## 2021-08-20 LAB — MAGNESIUM: Magnesium: 1.5 mg/dL — ABNORMAL LOW (ref 1.7–2.4)

## 2021-08-20 MED ORDER — MAGNESIUM SULFATE 2 GM/50ML IV SOLN
2.0000 g | Freq: Once | INTRAVENOUS | Status: DC
  Filled 2021-08-20: qty 50

## 2021-08-20 MED ORDER — PANTOPRAZOLE SODIUM 40 MG PO TBEC
40.0000 mg | DELAYED_RELEASE_TABLET | Freq: Every day | ORAL | Status: DC
  Administered 2021-08-20 – 2021-08-22 (×3): 40 mg via ORAL
  Filled 2021-08-20 (×3): qty 1

## 2021-08-20 NOTE — Plan of Care (Signed)

## 2021-08-20 NOTE — Progress Notes (Signed)
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  On arrival to the ED, patient was being bagged by EMS to maintain SPO2 in the low 90s.  He was noted to have an elevated WBC count of 22, tachypneic, tachycardic and hypoxia with concern for sepsis secondary to pneumonia in the setting of seizure activity.  Patient was started on empiric antibiotics and intubated by EDP for severe hypoxic/hypercarbic respite failure in the setting of obesity, OHS, severe deconditioning..  Patient was initially admitted to the critical care service.  Patient was extubated on 08/14/2021.  Patient was transferred to hospital service on 12/27.  Remains medically stable for discharge.  Discharge has been delayed by lack of insurance approval and delivery of home trilogy NIV   Assessment & Plan:   Principal Problem:   Acute on chronic respiratory failure with hypoxia and hypercapnia (HCC)  Acute on chronic hypoxic and hypercarbic respiratory failure, POA Morbid obesity with obesity hypoventilation, severe deconditioning Patient with history of severe Covid-19 pneumonia initially requiring tracheostomy which has subsequently been decannulated, MSSA pneumonia, Morbid obesity, probable OHS/OSA and acute worsening  likely secondary to multifactorial etiology from sepsis, OSA, OHS.  Was initially admitted by [facility] service after being intubated by ED physician.  Was successfully extubated on 08/14/2021 to BiPAP. --Continue BiPAP nightly, social work assisting with NIV for home use --Brovana neb BID --Pulmicort neb BID --Nasal cannula during the day, maintain SPO2 greater than 92% --Aggressive pulmonary hygiene --Awaiting insurance authorization in order for [employer] to deliver trilogy NIV; as without this, patient has a very high bounce back potential given his brittle respiratory status -- As of 1/1 insurance authorization is still pending, precluding safe discharge   Sepsis 2/2 MSSA bacteremia with MSSA right knee septic arthritis s/p I&D x2 Patient seen by ID on 12/20. Was to complete IV Cefazolin on 12/22 then plan to transition to Cefadroxil continue IV cefazolin while inpatient.  TEE 07/12/2021 negative.  Now with recurrent fever with T-max 100.9 on 08/15/2021. --Infectious disease following, appreciate assistance --Blood cultures 12/21 with no growth x5 days --Repeat blood cultures 12/27: No growth x 4 days --PICC line removed 12/28 --Cefadroxil 1 g p.o. twice daily until 09/11/21 ID   Acute metabolic encephalopathy likely multifactorial in the setting of sepsis Baseline issues with anxiety/impulsivity CT Head negative for acute abnormality, Limited MRI negative for acute abnormality. EEG without seizure activity --Depakote 500 mg TID --Klonopin 1 mg BID --Ativan PRN --Avoid fentanyl due to paradoxic agitation   Seizure Like Activity Similar presentation during last admission, Tramadol was previously discontinued due to suspected seizures, it appears that this was restarted as outpatient, now [place]. CT Head shows no acute intracranial abnormality. MRI shows no acute abnormality though limited study. EEG showed no seizure activity. May have been myoclonic activity due to hypoxia. --Depakene 500  mg q8h --Seizure precautions   H/o upper extremity DVT It appears that he was  started on Xarelto before around Feb/Mar 2022 for provoked upper extremity DVT. CTA chest neg for PE --Continue Xarelto    Anasarca --net negative 1.5L past 24h, net negative 2.8L since admission --Furosemide 40 mg IV every other day   Diabetes mellitus Last HgbA1c 6.8 on 07/08/21.  Diet controlled at home. --Resistant SSI for coverage --CBGs qAC/HS   Sinus tachycardia --Metoprolol succinate 100 mg p.o. daily   Morbid obesity Body mass index is 40.5 kg/m.  Discussed with patient needs for aggressive lifestyle changes/weight loss as this complicates all facets of care.  Outpatient follow-up with PCP.  May benefit from bariatric evaluation outpatient.   Weakness/deconditioning/debility: --PT/OT recommending home health on discharge --Continue therapy efforts while inpatient   DVT prophylaxis: Xarelto Code Status: Full Family Communication: None today Disposition Plan: Status is: Inpatient  Remains inpatient appropriate because: Unsafe discharge plan.  Awaiting insurance approval and delivery of trilogy device for safe discharge     Level of care: Stepdown  Consultants:  None  Procedures:  None  Antimicrobials: None   Subjective: Seen and examined.  Resting in bed.  No visible distress  Objective: Vitals:   08/20/21 0819 08/20/21 0900 08/20/21 1000 08/20/21 1200  BP: 126/65 136/70 122/65 (!) 115/58  Pulse: (!) 102 (!) 105 (!) 105 99  Resp: (!) 27 (!) 25 (!) 27 (!) 25  Temp: 98.8 F (37.1 C)     TempSrc: Oral     SpO2: 97% 96% 96% 95%  Weight:      Height:        Intake/Output Summary (Last 24 hours) at 08/20/2021 1352 Last data filed at 08/20/2021 1200 Gross per 24 hour  Intake 600 ml  Output 4200 ml  Net -3600 ml   Filed Weights   08/18/21 0402 08/19/21 0438 08/20/21 0617  Weight: 136 kg (!) 139.2 kg (!) 138.5 kg    Examination:  General exam: No acute distress.   Chronically ill-appearing Respiratory system: Breath sounds decreased at bases.  Normal work of breathing.  2 L Cardiovascular system: S1-S2, no murmurs, no pedal edema Gastrointestinal system: Obese, NT/ND, normal bowel sounds Central nervous system: Alert and oriented. No focal neurological deficits. Extremities: Symmetric 5 x 5 power. Skin: No rashes, lesions or ulcers Psychiatry: Judgement and insight appear normal. Mood & affect appropriate.     Data Reviewed: I have personally reviewed following labs and imaging studies  CBC: Recent Labs  Lab 08/14/21 0345 08/15/21 0500 08/16/21 0521 08/17/21 0546 08/20/21 0848  WBC 6.1 8.2 7.0 9.0 6.0  NEUTROABS  --   --  4.4  --  3.2  HGB 8.9* 9.1* 8.7* 8.7* 9.2*  HCT 31.0* 30.7* 30.2* 30.0* 32.6*  MCV 95.4 91.9 94.1 94.3 95.3  PLT 200 250 247 279 924   Basic Metabolic Panel: Recent Labs  Lab 08/14/21 0345 08/14/21 1222 08/15/21 0500 08/16/21 0521 08/17/21 0546 08/20/21 0848  NA 141 143 138 137 137 137  K 3.0* 3.6 3.2* 4.0 5.0 3.5  CL 96* 95* 92* 91* 92* 85*  CO2 36* 38* 38* 37* 35* 43*  GLUCOSE 153* 102* 91 117* 135* 131*  BUN 17 13 8 6  <5* <5*  CREATININE 0.43* 0.41* 0.41* 0.32* 0.42* 0.40*  CALCIUM 9.3 9.6 9.2 9.4 9.7 10.0  MG 1.4* 2.1 1.6* 1.7 1.5* 1.5*  PHOS 4.8*  --  5.4* 5.0*  --   --    GFR: Estimated Creatinine Clearance: 193.7 mL/min (A) (by C-G formula based on SCr of 0.4 mg/dL (  L)). Liver Function Tests: No results for input(s): AST, ALT, ALKPHOS, BILITOT, PROT, ALBUMIN in the last 168 hours. No results for input(s): LIPASE, AMYLASE in the last 168 hours. No results for input(s): AMMONIA in the last 168 hours. Coagulation Profile: No results for input(s): INR, PROTIME in the last 168 hours. Cardiac Enzymes: Recent Labs  Lab 08/15/21 1255  CKTOTAL 56   BNP (last 3 results) No results for input(s): PROBNP in the last 8760 hours. HbA1C: No results for input(s): HGBA1C in the last 72  hours. CBG: Recent Labs  Lab 08/19/21 1912 08/19/21 2256 08/20/21 0350 08/20/21 0720 08/20/21 1137  GLUCAP 138* 178* 137* 124* 121*   Lipid Profile: No results for input(s): CHOL, HDL, LDLCALC, TRIG, CHOLHDL, LDLDIRECT in the last 72 hours. Thyroid Function Tests: No results for input(s): TSH, T4TOTAL, FREET4, T3FREE, THYROIDAB in the last 72 hours. Anemia Panel: No results for input(s): VITAMINB12, FOLATE, FERRITIN, TIBC, IRON, RETICCTPCT in the last 72 hours. Sepsis Labs: Recent Labs  Lab 08/15/21 1245  PROCALCITON 0.15    Recent Results (from the past 240 hour(s))  CULTURE, BLOOD (ROUTINE X 2) w Reflex to ID Panel     Status: None   Collection Time: 08/15/21 12:45 PM   Specimen: BLOOD  Result Value Ref Range Status   Specimen Description BLOOD LEFT HAND  Final   Special Requests   Final    BOTTLES DRAWN AEROBIC AND ANAEROBIC Blood Culture adequate volume   Culture   Final    NO GROWTH 5 DAYS Performed at [facility], [address] [place], [place] [address]    Report Status 08/20/2021 FINAL  Final  CULTURE, BLOOD (ROUTINE X 2) w Reflex to ID Panel     Status: None   Collection Time: 08/15/21 12:46 PM   Specimen: BLOOD  Result Value Ref Range Status   Specimen Description BLOOD RIGHT HAND  Final   Special Requests   Final    BOTTLES DRAWN AEROBIC AND ANAEROBIC Blood Culture adequate volume   Culture   Final    NO GROWTH 5 DAYS Performed at [facility], [address] [place], [place] [address]    Report Status 08/20/2021 FINAL  Final  Resp Panel by RT-PCR (Flu A&B, Covid) Nasopharyngeal Swab     Status: None   Collection Time: 08/16/21  4:18 PM   Specimen: Nasopharyngeal Swab; Nasopharyngeal(NP) swabs in vial transport medium  Result Value Ref Range Status   SARS Coronavirus 2 by RT PCR NEGATIVE NEGATIVE Final    Comment: (NOTE) SARS-CoV-2 target nucleic acids are NOT DETECTED.  The SARS-CoV-2 RNA is generally detectable in upper  respiratory specimens during the acute phase of infection. The lowest concentration of SARS-CoV-2 viral copies this assay can detect is 138 copies/mL. A negative result does not preclude SARS-Cov-2 infection and should not be used as the sole basis for treatment or other patient management decisions. A negative result may occur with  improper specimen collection/handling, submission of specimen other than nasopharyngeal swab, presence of viral mutation(s) within the areas targeted by this assay, and inadequate number of viral copies(<138 copies/mL). A negative result must be combined with clinical observations, patient history, and epidemiological information. The expected result is Negative.  Fact Sheet for Patients:  EntrepreneurPulse.com.au  Fact Sheet for Healthcare Providers:  IncredibleEmployment.be  This test is no t yet approved or cleared by the [place] FDA and  has been authorized for detection and/or diagnosis of SARS-CoV-2 by FDA under an Emergency  Use Authorization (EUA). This EUA will remain  in effect (meaning this test can be used) for the duration of the COVID-19 declaration under Section 564(b)(1) of the Act, 21 U.S.C.section 360bbb-3(b)(1), unless the authorization is terminated  or revoked sooner.       Influenza A by PCR NEGATIVE NEGATIVE Final   Influenza B by PCR NEGATIVE NEGATIVE Final    Comment: (NOTE) The Xpert Xpress SARS-CoV-2/FLU/RSV plus assay is intended as an aid in the diagnosis of influenza from Nasopharyngeal swab specimens and should not be used as a sole basis for treatment. Nasal washings and aspirates are unacceptable for Xpert Xpress SARS-CoV-2/FLU/RSV testing.  Fact Sheet for Patients: EntrepreneurPulse.com.au  Fact Sheet for Healthcare Providers: IncredibleEmployment.be  This test is not yet approved or cleared by the [place] FDA and has been  authorized for detection and/or diagnosis of SARS-CoV-2 by FDA under an Emergency Use Authorization (EUA). This EUA will remain in effect (meaning this test can be used) for the duration of the COVID-19 declaration under Section 564(b)(1) of the Act, 21 U.S.C. section 360bbb-3(b)(1), unless the authorization is terminated or revoked.  Performed at [facility], [place], [place] [address]   Respiratory (~20 pathogens) panel by PCR     Status: None   Collection Time: 08/16/21  4:18 PM   Specimen: Nasopharyngeal Swab; Respiratory  Result Value Ref Range Status   Adenovirus NOT DETECTED NOT DETECTED Final   Coronavirus 229E NOT DETECTED NOT DETECTED Final    Comment: (NOTE) The Coronavirus on the Respiratory Panel, DOES NOT test for the novel  Coronavirus (2019 nCoV)    Coronavirus HKU1 NOT DETECTED NOT DETECTED Final   Coronavirus NL63 NOT DETECTED NOT DETECTED Final   Coronavirus OC43 NOT DETECTED NOT DETECTED Final   Metapneumovirus NOT DETECTED NOT DETECTED Final   Rhinovirus / Enterovirus NOT DETECTED NOT DETECTED Final   Influenza A NOT DETECTED NOT DETECTED Final   Influenza B NOT DETECTED NOT DETECTED Final   Parainfluenza Virus 1 NOT DETECTED NOT DETECTED Final   Parainfluenza Virus 2 NOT DETECTED NOT DETECTED Final   Parainfluenza Virus 3 NOT DETECTED NOT DETECTED Final   Parainfluenza Virus 4 NOT DETECTED NOT DETECTED Final   Respiratory Syncytial Virus NOT DETECTED NOT DETECTED Final   Bordetella pertussis NOT DETECTED NOT DETECTED Final   Bordetella Parapertussis NOT DETECTED NOT DETECTED Final   Chlamydophila pneumoniae NOT DETECTED NOT DETECTED Final   Mycoplasma pneumoniae NOT DETECTED NOT DETECTED Final    Comment: Performed at [facility], [place]. [address] [place], [place] [address]         Radiology Studies: No results found.      Scheduled Meds:  arformoterol  15 mcg Nebulization BID   vitamin C  500 mg Oral BID    budesonide (PULMICORT) nebulizer solution  0.25 mg Nebulization BID   cefadroxil  1,000 mg Oral BID   Chlorhexidine Gluconate Cloth  6 each Topical Daily   clonazePAM  1 mg Oral BID   clotrimazole   Topical BID   docusate sodium  100 mg Oral BID   DULoxetine  60 mg Oral Daily   furosemide  40 mg Intravenous QODAY   insulin aspart  0-20 Units Subcutaneous Q4H   lidocaine  1 patch Transdermal Q24H   mouth rinse  15 mL Mouth Rinse BID   metoprolol succinate  100 mg Oral Daily   multivitamin with minerals  1 tablet Oral Daily   nystatin  Topical BID   pantoprazole  40 mg Oral QHS   polyethylene glycol  17 g Oral Daily   QUEtiapine  25 mg Oral QHS   rivaroxaban  20 mg Oral Daily   valproic acid  500 mg Oral TID   Continuous Infusions:  magnesium sulfate bolus IVPB       LOS: 10 days    Time spent: 25 minutes    Sidney Ace, MD [facility]   If 7PM-7AM, please contact night-coverage  08/20/2021, 1:52 PM

## 2021-08-21 DIAGNOSIS — J9621 Acute and chronic respiratory failure with hypoxia: Secondary | ICD-10-CM | POA: Diagnosis not present

## 2021-08-21 DIAGNOSIS — J9622 Acute and chronic respiratory failure with hypercapnia: Secondary | ICD-10-CM | POA: Diagnosis not present

## 2021-08-21 LAB — GLUCOSE, CAPILLARY
Glucose-Capillary: 111 mg/dL — ABNORMAL HIGH (ref 70–99)
Glucose-Capillary: 133 mg/dL — ABNORMAL HIGH (ref 70–99)
Glucose-Capillary: 182 mg/dL — ABNORMAL HIGH (ref 70–99)
Glucose-Capillary: 151 mg/dL — ABNORMAL HIGH (ref 70–99)
Glucose-Capillary: 147 mg/dL — ABNORMAL HIGH (ref 70–99)

## 2021-08-21 MED ORDER — POTASSIUM CHLORIDE CRYS ER 20 MEQ PO TBCR
40.0000 meq | EXTENDED_RELEASE_TABLET | Freq: Once | ORAL | Status: AC
  Administered 2021-08-21: 40 meq via ORAL
  Filled 2021-08-21: qty 2

## 2021-08-21 MED ORDER — PREGABALIN 75 MG PO CAPS
200.0000 mg | ORAL_CAPSULE | Freq: Two times a day (BID) | ORAL | Status: DC
  Administered 2021-08-21 – 2021-08-23 (×4): 200 mg via ORAL
  Filled 2021-08-21 (×4): qty 1

## 2021-08-21 MED ORDER — DIPHENHYDRAMINE HCL 50 MG/ML IJ SOLN
25.0000 mg | Freq: Once | INTRAMUSCULAR | Status: AC
  Administered 2021-08-21: 25 mg via INTRAVENOUS
  Filled 2021-08-21: qty 1

## 2021-08-21 MED ORDER — INSULIN ASPART 100 UNIT/ML IJ SOLN: 0.0000 [IU] | Freq: Every day | INTRAMUSCULAR | Status: DC

## 2021-08-21 MED ORDER — MAGNESIUM SULFATE 2 GM/50ML IV SOLN
2.0000 g | Freq: Once | INTRAVENOUS | Status: AC
  Administered 2021-08-21: 2 g via INTRAVENOUS
  Filled 2021-08-21: qty 50

## 2021-08-21 MED ORDER — FUROSEMIDE 40 MG PO TABS
40.0000 mg | ORAL_TABLET | Freq: Two times a day (BID) | ORAL | Status: DC
  Administered 2021-08-22 – 2021-08-23 (×3): 40 mg via ORAL
  Filled 2021-08-21 (×3): qty 1

## 2021-08-21 MED ORDER — ALLOPURINOL 100 MG PO TABS
300.0000 mg | ORAL_TABLET | Freq: Every day | ORAL | Status: DC
  Administered 2021-08-22 – 2021-08-23 (×2): 300 mg via ORAL
  Filled 2021-08-21 (×2): qty 3

## 2021-08-21 MED ORDER — INSULIN ASPART 100 UNIT/ML IJ SOLN
0.0000 [IU] | Freq: Three times a day (TID) | INTRAMUSCULAR | Status: DC
  Administered 2021-08-21 (×2): 4 [IU] via SUBCUTANEOUS
  Administered 2021-08-21: 3 [IU] via SUBCUTANEOUS
  Administered 2021-08-22 (×2): 7 [IU] via SUBCUTANEOUS
  Administered 2021-08-23: 4 [IU] via SUBCUTANEOUS
  Administered 2021-08-23: 3 [IU] via SUBCUTANEOUS
  Filled 2021-08-21 (×6): qty 1

## 2021-08-21 MED ORDER — ACETAZOLAMIDE 250 MG PO TABS
250.0000 mg | ORAL_TABLET | Freq: Two times a day (BID) | ORAL | Status: DC
  Administered 2021-08-21 – 2021-08-23 (×4): 250 mg via ORAL
  Filled 2021-08-21 (×6): qty 1

## 2021-08-21 NOTE — Progress Notes (Signed)
Physical Therapy Treatment Patient Details Name: Craig [name] MRN: [id number] DOB: October 07, 1990 Today's Date: 08/21/2021   History of Present Illness Craig [name] is a 65yoM who comes to [facility] on 12/22 c SOB. PMH: inflammatory arthritis, DVT on xarelto, COVID19 infection c prolonged critical illness Dec '21-Feb '22, post covid tachycardia, CRF on 2L O2, bactremia, PNA, ostomy, gout. Pt appeared to sustain tonic clonc seizure en route to ED. Pt required intubation upon arrival. Pt successfully extubated on 12/26.    PT Comments    Patient agreeable to PT session today. Mother at the bedside. Patient is eager to be discharged home. He participated with UE and LE exercises in bed with AAROM. Patient complains of generalized joint pain with all ROM but especially in the right knee and left shoulder. Educated patient on the importance of routine repositioning for skin integrity. Recommend to continue PT to maximize independence.    Recommendations for follow up therapy are one component of a multi-disciplinary discharge planning process, led by the attending physician.  Recommendations may be updated based on patient status, additional functional criteria and insurance authorization.  Follow Up Recommendations  Home health PT     Assistance Recommended at Discharge Frequent or constant Supervision/Assistance  Equipment Recommendations  None recommended by PT    Recommendations for Other Services       Precautions / Restrictions Precautions Precautions: Fall Restrictions Weight Bearing Restrictions: No     Mobility  Bed Mobility               General bed mobility comments: patient declined repositioning in the bed. educated patient on the importance for routine repositioning for skin integrity.    Transfers                        Ambulation/Gait                   Stairs             Wheelchair Mobility    Modified Rankin (Stroke Patients  Only)       Balance                                            Cognition Arousal/Alertness: Awake/alert;Lethargic Behavior During Therapy: WFL for tasks assessed/performed Overall Cognitive Status: Within Functional Limits for tasks assessed                                 General Comments: difficult to understand at times but able to follow single step commands consistently        Exercises General Exercises - Upper Extremity Shoulder Flexion: AAROM;Right;Left;10 reps;Supine Shoulder Flexion Limitations: limited forward shoulder flexion to less than 90 degrees for comfort as patient with pain in shoulders with AROM Elbow Flexion: AAROM;Strengthening;Both;10 reps;Supine General Exercises - Lower Extremity Ankle Circles/Pumps: AAROM;Strengthening;Both;20 reps;Supine Ankle Circles/Pumps Limitations: unable to achieve neutral positioning Heel Slides: AAROM;Strengthening;Both;10 reps;Supine (limited ROM right knee due to pain) Hip ABduction/ADduction: AAROM;Strengthening;Left;10 reps;Supine Straight Leg Raises: AAROM;Strengthening;Both;10 reps;Supine Other Exercises Other Exercises: verbal and tactile cues for exercise technique for strengthening.    General Comments        Pertinent Vitals/Pain Pain Assessment: Faces Faces Pain Scale: Hurts even more Pain Location: right knee, left shoulder, generalized pain in most joints with  range of motion Pain Descriptors / Indicators: Sore Pain Intervention(s): Limited activity within patient's tolerance;Monitored during session    Home Living                          Prior Function            PT Goals (current goals can now be found in the care plan section) Acute Rehab PT Goals Patient Stated Goal: return  home PT Goal Formulation: With patient/family Time For Goal Achievement: 08/29/21 Potential to Achieve Goals: Fair Progress towards PT goals: Progressing toward goals     Frequency    Min 2X/week      PT Plan Current plan remains appropriate    Co-evaluation              AM-PAC PT "6 Clicks" Mobility   Outcome Measure  Help needed turning from your back to your side while in a flat bed without using bedrails?: Total Help needed moving from lying on your back to sitting on the side of a flat bed without using bedrails?: Total Help needed moving to and from a bed to a chair (including a wheelchair)?: Total Help needed standing up from a chair using your arms (e.g., wheelchair or bedside chair)?: Total Help needed to walk in hospital room?: Total Help needed climbing 3-5 steps with a railing? : Total 6 Click Score: 6    End of Session Equipment Utilized During Treatment: Oxygen Activity Tolerance: Patient limited by fatigue;Patient limited by pain Patient left: in bed;with call bell/phone within reach;with family/visitor present Nurse Communication:  (alerted nurse aide that patient wanted to be placed on bi-pap for napping) PT Visit Diagnosis: Unsteadiness on feet (R26.81);Muscle weakness (generalized) (M62.81);Difficulty in walking, not elsewhere classified (R26.2)     Time: 0762-2633 PT Time Calculation (min) (ACUTE ONLY): 28 min  Charges:  $Therapeutic Exercise: 23-37 mins                    Minna Merritts, PT, MPT    [name] 08/21/2021, 1:16 PM

## 2021-08-21 NOTE — Progress Notes (Incomplete)
NAME:  Craig [name], MRN:  [id number], DOB:  Jan 10, 1991, LOS: 11 ADMISSION DATE:  08/09/2021, INITIAL CONSULTATION DATE:  08/10/21 REFERRING MD: [name], MD CHIEF COMPLAINT: SOB    HPI  31 y.o with significant extensive medical history including inflammatory arthritis on methotrexate and chronic prednisone at 10 mg daily, prior severe Covid-19 infection requiring prolonged hospitalization and tracheostomy (since decannulated) Dec 2021 - Feb 2022, chronic hypoxic and hypercarpneic respiratory failure on 2L O2 PRN, MSSA bacteremia, MSSA pneumonia, perforated diverticulitis s/p partial colectomy with ostomy creation, morbid obesity and gout who presented to the ED with chief complaints of acute onset shortness of breath.  Per patient's wife at the bedside, patient became anxious and acutely short of breath while transferring form the chair to his new tempur pedic bed. EMS was called and on arrival patient was found to be hypoxic with sats in the mid 80's. Patient suffered what appeared to be a generalized tonic clonic seizures while being prepared for transport to the ED. He was placed on NRB mask and bagged during transport.  ED Course: On arrival to the ED, he was afebrile with blood pressure  mm Hg and pulse rate  beats/min. There were no focal neurological deficits; patient was somnolent, not responsive to deep sternal rub or responding to commands. EKG showed sinus tachycardia 143 bpm with a narrow QRS, normal axis, slight QTC prolongation otherwise largely normal intervals with nonspecific ST changes no ST elevation. Patient was subsequently intubated with 7.5 cuffed ETT for airway protection. Chest x-ray  post intubation showed vascular congestion.  ET tube in satisfactory position.  PICC line in satisfactory position.  Patient started on broad spectrum antibiotics. PCCM is asked to admit the patient to ICU for further work up and management of acute hypoxic hypercapnic  respiratory failure.  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 No acute intracranial pathology. 2. Mild bilateral ethmoid sinus and marked severity sphenoid sinus disease. 12/22: CTA Chest>No evidence of pulmonary embolism. 2. Stable, moderate to marked severity bilateral upper lobe and left lower lobe consolidative atelectatic changes. 3. Hepatic steatosis 12/23: MRI brain, limited study however no intracranial structural abnormality noted.  Sphenoid sinus disease confirmed.  Micro Data:  12/21: SARS-CoV-2 PCR> negative 12/21: Influenza PCR> negative 12/21: Blood culture x2>negative  12/21: Urine Culture>negative 12/22: MRSA PCR>> neg 12/22: Strep pneumo urinary antigen>negative 12/22: Legionella urinary antigen> not done  Antimicrobials:  Vancomycin 12/21 x 1 Aztreonam  12/21 x 1 Cefazolin (continued from outpatient)  OBJECTIVE  Blood pressure (!) 112/50, pulse (!) 101, temperature 99.1 F (37.3 C), temperature source Oral, resp. rate (!) 30, height 6' 0.99" (1.854 m), weight (!) 138.5 kg, SpO2 95 %.        Intake/Output Summary (Last 24 hours) at 08/21/2021 0859 Last data filed at 08/21/2021 0600 Gross per 24 hour  Intake 1.26 ml  Output 4200 ml  Net -4198.74 ml    Filed Weights   08/18/21 0402 08/19/21 0438 08/20/21 0617  Weight: 136 kg (!) 139.2 kg (!) 138.5 kg   Physical Examination  GENERAL: 31 year-old obese patient lying in the bed intubated, off of propofol.  On Precedex. EYES: Pupils equal, round, reactive to light and accommodation. No scleral icterus.  HEENT: Head atraumatic, normocephalic.  Orotracheally intubated. NECK:  Supple, thick. No thyroid enlargement, trachea midline.  LUNGS: Normal breath sounds bilaterally, no wheezing, rales,rhonchi or crepitation.  Tolerating SBT today.  Good air leak on cuff deflation. CARDIOVASCULAR: Tachycardic, S1, S2 normal. No murmurs, rubs, or gallops.  ABDOMEN: Soft, Obese, nondistended. Bowel sounds present. No organomegaly or mass. Ostomy present, functioning. EXTREMITIES: Bilateral lower extremities pitting edema/1-2+ anasarca NEUROLOGIC: No overt focal deficit on limited exam  PSYCHIATRIC: The patient is intubated, follows commands, writing notes to communicate  SKIN: Improving from previously described findings with nursing interventions  Pressure Injury 07/08/21 Scrotum Bilateral Stage 1 -  Intact skin with non-blanchable redness of a localized area usually over a bony prominence. red nonblanchable on sacrum, above sacrum and scrotal sack (Active)  07/08/21   Location: Scrotum  Location Orientation: Bilateral  Staging: Stage 1 -  Intact skin with non-blanchable redness of a localized area usually over a bony prominence.  Wound Description (Comments): red nonblanchable on sacrum, above sacrum  and scrotal sack  Present on Admission: Yes   Labs   CBC: Recent Labs  Lab 08/15/21 0500 08/16/21 0521 08/17/21 0546 08/20/21 0848  WBC 8.2 7.0 9.0 6.0  NEUTROABS  --  4.4  --  3.2  HGB 9.1* 8.7* 8.7* 9.2*  HCT 30.7* 30.2* 30.0* 32.6*  MCV 91.9 94.1 94.3 95.3  PLT 250 247 279 265     Basic Metabolic Panel: Recent Labs  Lab 08/14/21 1222 08/15/21 0500 08/16/21 0521 08/17/21 0546 08/20/21 0848  NA 143 138 137 137 137  K 3.6 3.2* 4.0 5.0 3.5  CL 95* 92* 91* 92* 85*  CO2 38* 38* 37* 35* 43*  GLUCOSE 102* 91 117* 135* 131*  BUN 13 8 6  <5* <5*  CREATININE 0.41* 0.41* 0.32* 0.42* 0.40*  CALCIUM 9.6 9.2 9.4 9.7 10.0  MG 2.1 1.6* 1.7 1.5* 1.5*  PHOS  --  5.4* 5.0*  --   --     GFR: Estimated Creatinine Clearance: 193.7 mL/min (A) (by C-G formula based on SCr of 0.4 mg/dL (L)). Recent Labs  Lab 08/15/21 0500 08/15/21 1245 08/16/21 0521 08/17/21 0546 08/20/21 0848  PROCALCITON  --  0.15  --   --   --   WBC 8.2  --  7.0 9.0 6.0     Liver Function Tests: No results for input(s): AST, ALT, ALKPHOS, BILITOT, PROT, ALBUMIN in the last 168 hours.  No results for input(s): LIPASE, AMYLASE in the last 168 hours. No results for input(s): AMMONIA in the last 168 hours.  ABG    Component Value Date/Time   PHART 7.37 08/09/2021 2344   PCO2ART  63 (H) 08/09/2021 2344   PO2ART 113 (H) 08/09/2021 2344   HCO3 36.4 (H) 08/09/2021 2344   TCO2 41 (H) 07/18/2021 0843   O2SAT 98.3 08/09/2021 2344      Coagulation Profile: No results for input(s): INR, PROTIME in the last 168 hours.   Cardiac Enzymes: Recent Labs  Lab 08/15/21 1255  CKTOTAL 56    HbA1C: Hgb A1c MFr Bld  Date/Time Value Ref Range Status  07/08/2021 06:29 PM 6.8 (H) 4.8 - 5.6 % Final    Comment:    (NOTE) Pre diabetes:          5.7%-6.4%  Diabetes:              >6.4%  Glycemic control for   <7.0% adults with diabetes   08/10/2020 04:33 AM 5.7 (H) 4.8 - 5.6 % Final    Comment:     (NOTE) Pre diabetes:          5.7%-6.4%  Diabetes:              >6.4%  Glycemic control for   <7.0% adults with diabetes     CBG: Recent Labs  Lab 08/20/21 1544 08/20/21 1854 08/20/21 2341 08/21/21 0416 08/21/21 0729  GLUCAP 133* 152* 173* 111* 133*     Review of Systems:   Does not communicate any major complaint.  Endorses discomfort with the ET tube.  Feels anxious.  Allergies Allergies  Allergen Reactions   Tramadol Hcl     SEIZURES!!! DO NOT REORDER AT DISCHARGE!!!   Fentanyl Citrate Other (See Comments)    Makes patient more agitated & delirious   Amoxicillin Rash    Tolerated cefepime and cefazolin 08/2020. Tolerated Zosyn 07/2021     Scheduled Inpatient Meds:  arformoterol  15 mcg Nebulization BID   vitamin C  500 mg Oral BID   budesonide (PULMICORT) nebulizer solution  0.25 mg Nebulization BID   cefadroxil  1,000 mg Oral BID   Chlorhexidine Gluconate Cloth  6 each Topical Daily   clonazePAM  1 mg Oral BID   clotrimazole   Topical BID   docusate sodium  100 mg Oral BID   DULoxetine  60 mg Oral Daily   furosemide  40 mg Intravenous QODAY   insulin aspart  0-20 Units Subcutaneous Q4H   lidocaine  1 patch Transdermal Q24H   mouth rinse  15 mL Mouth Rinse BID   metoprolol succinate  100 mg Oral Daily   multivitamin with minerals  1 tablet Oral Daily   nystatin   Topical BID   pantoprazole  40 mg Oral QHS   polyethylene glycol  17 g Oral Daily   QUEtiapine  25 mg Oral QHS   rivaroxaban  20 mg Oral Daily   valproic acid  500 mg Oral TID   Continuous Infusions:  magnesium sulfate bolus IVPB 10 mL/hr at 08/20/21 2100   PRN Meds:.acetaminophen, diazepam, docusate sodium, gadobutrol, ondansetron (ZOFRAN) IV, oxyCODONE   Assessment & Plan:  Acute on chronic hypoxic and hypercarbic respiratory failure  Sarcopenic obesity with obesity hypoventilation, severe deconditioning PMHx: Covid-19 pneumonia, MSSA pneumonia, Morbid obesity, probable OHS/OSA,   -Tolerating SBT, will proceed with extubation -Extubate to BiPAP -Head of bed elevated 30 degrees, while on BiPAP -Intermittent chest x-ray & ABG PRN -Ensure adequate pulmonary hygiene  -Bronchodilators    Sepsis 2/2 MSSA bacteremia with MSSA right knee septic arthritis s/p I&D x2 Lactic acidosis, sepsis physiology corrected Patient seen by ID on 12/20. Was to complete  IV Cefazolin on 12/22 then plan to transition to Cefadroxil continue IV cefazolin while inpatient -TEE on 11/23 negative -F/u blood cultures >> NGTD  -Monitor WBC/ fever curve -Strict I/O's -Continue steroids per home; no indication for stress dose steroids at this time   Acute metabolic encephalopathy likely multifactorial in the setting of sepsis Baseline issues with anxiety/impulsivity -CT Head negative for acute abnormality -Limited MRI negative for acute abnormality -EEG without seizure activity -Supportive care -Depakote for mood stabilization, 500 milligrams p.o. 3 times daily -Klonopin 1 mg 3 times daily -As needed Ativan -Precedex, titrate off today -Avoid fentanyl due to paradoxic agitation  Seizure Like Activity Similar presentation during last admission, Tramadol was previously discontinued due to suspected seizures, it appears that this was restarted as outpatient, now DC'd. -CT Head shows no acute intracranial abnormality -MRI shows no acute abnormality though limited study -EEG showed no seizure activity -May have been myoclonic activity due to hypoxia -Discontinued Keppra -Depacon switched to Depakene 500 mg every 8 adjusting for levels -Sedation as above weaning as able -Seizure precautions -Valproic acid level was low, dose adjustment made  H/o upper extremity DVT It appears that he was started on Xarelto before around Feb/Mar 2022 for provoked upper extremity DVT.   -CTA chest neg for PE -Resumed Xarelto   Anasarca -Continue Lasix 40 mg IV every 12 -Acetazolamide as needed -Anasarca  improving  Diabetes mellitus Last HgbA1c 6.8 on 07/08/21 -CBGs -Sliding scale insulin -Follow ICU hyper/hypoglycemia protocol  target goal 140 - 180 mg/dL -Hold home Meds  Electrolyte derangements Hypomagnesemia, hypokalemia Supplementing per pharmacy    Best practice:  Diet: Tube feeds per dietitian Pain/Anxiety/Delirium protocol (if indicated): Yes (RASS goal 0) VAP protocol (if indicated): Yes DVT prophylaxis: Systemic AC, Xarelto  GI prophylaxis: PPI Glucose control:  SSI Yes Central venous access:  Yes, and it is still needed Arterial line:  N/A Foley:  Yes, and it is still needed Mobility:  bed rest  PT consulted: N/A Last date of multidisciplinary goals of care discussion [12/22] Code Status:  full code Disposition: ICU   = Goals of Care = Code Status Order: FULL   Primary Emergency Contact: [place], Home Phone: [phone] Wishes to pursue full aggressive treatment and intervention options, including CPR and intubation, but goals of care will be addressed on going with family if that should become necessary.  Critical care provider statement:   Total critical care time: 33 minutes   Performed by: Lanney Gins MD   Critical care time was exclusive of separately billable procedures and treating other patients.   Critical care was necessary to treat or prevent imminent or life-threatening deterioration.   Critical care was time spent personally by me on the following activities: development of treatment plan with patient and/or surrogate as well as nursing, discussions with consultants, evaluation of patient's response to treatment, examination of patient, obtaining history from patient or surrogate, ordering and performing treatments and interventions, ordering and review of laboratory studies, ordering and review of radiographic studies, pulse oximetry and re-evaluation of patient's condition.    Ottie Glazier, M.D.  Pulmonary & Critical Care  Medicine      *This note was dictated using voice recognition software/Dragon.  Despite best efforts to proofread, errors can occur which can change the meaning. Any transcriptional errors that result from this process are unintentional and may not be fully corrected at the time of dictation.

## 2021-08-21 NOTE — Progress Notes (Signed)
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  On arrival to the ED, patient was being bagged by EMS to maintain SPO2 in the low 90s.  He was noted to have an elevated WBC count of 22, tachypneic, tachycardic and hypoxia with concern for sepsis secondary to pneumonia in the setting of seizure activity.  Patient was started on empiric antibiotics and intubated by EDP for severe hypoxic/hypercarbic respite failure in the setting of obesity, OHS, severe deconditioning..  Patient was initially admitted to the critical care service.  Patient was extubated on 08/14/2021.  Patient was transferred to hospital service on 12/27.  Remains medically stable for discharge.  Discharge has been delayed by lack of insurance approval and delivery of home trilogy NIV   Assessment & Plan:   Principal Problem:   Acute on chronic respiratory failure with hypoxia and hypercapnia (HCC)  Acute on chronic hypoxic and hypercarbic respiratory failure, POA Morbid obesity with obesity hypoventilation, severe deconditioning Patient with history of severe Covid-19 pneumonia initially requiring tracheostomy which has subsequently been decannulated, MSSA pneumonia, Morbid obesity, probable OHS/OSA and acute worsening  likely secondary to multifactorial etiology from sepsis, OSA, OHS.  Was initially admitted by [facility] service after being intubated by ED physician.  Was successfully extubated on 08/14/2021 to BiPAP. --Continue BiPAP nightly, social work assisting with NIV for home use --Brovana neb BID --Pulmicort neb BID --Nasal cannula during the day, maintain SPO2 greater than 92% --Aggressive pulmonary hygiene --Awaiting insurance authorization in order for [employer] to deliver trilogy NIV; as without this, patient has a very high bounce back potential given his brittle respiratory status -- As of 1/2 insurance authorization is still pending, precluding safe discharge   Sepsis 2/2 MSSA bacteremia with MSSA right knee septic arthritis s/p I&D x2 Patient seen by ID on 12/20. Was to complete IV Cefazolin on 12/22 then plan to transition to Cefadroxil continue IV cefazolin while inpatient.  TEE 07/12/2021 negative.  Now with recurrent fever with T-max 100.9 on 08/15/2021. --Infectious disease following, appreciate assistance --Blood cultures 12/21 with no growth x5 days --Repeat blood cultures 12/27: No growth x 4 days --PICC line removed 12/28 --Cefadroxil 1 g p.o. twice daily until 09/11/21 ID   Acute metabolic encephalopathy likely multifactorial in the setting of sepsis Baseline issues with anxiety/impulsivity CT Head negative for acute abnormality, Limited MRI negative for acute abnormality. EEG without seizure activity --Depakote 500 mg TID --Klonopin 1 mg BID --Ativan PRN --Avoid fentanyl due to paradoxic agitation -- Seroquel discontinued   Seizure Like Activity Similar presentation during last admission, Tramadol was previously discontinued due to suspected seizures, it appears that this was restarted as outpatient, now [place]. CT Head shows no acute intracranial abnormality. MRI shows no acute abnormality though limited study. EEG showed no seizure activity. May have been myoclonic activity due to  hypoxia. --Depakene 500 mg q8h --Seizure precautions   H/o upper extremity DVT It appears  that he was started on Xarelto before around Feb/Mar 2022 for provoked upper extremity DVT. CTA chest neg for PE --Continue Xarelto    Anasarca --net negative 1.5L past 24h, net negative 2.8L since admission --Furosemide 40 mg IV every other day   Diabetes mellitus Last HgbA1c 6.8 on 07/08/21.  Diet controlled at home. --Resistant SSI for coverage --CBGs qAC/HS   Sinus tachycardia --Metoprolol succinate 100 mg p.o. daily   Morbid obesity Body mass index is 40.5 kg/m.  Discussed with patient needs for aggressive lifestyle changes/weight loss as this complicates all facets of care.  Outpatient follow-up with PCP.  May benefit from bariatric evaluation outpatient.   Weakness/deconditioning/debility: --PT/OT recommending home health on discharge --Continue therapy efforts while inpatient   DVT prophylaxis: Xarelto Code Status: Full Family Communication: None today Disposition Plan: Status is: Inpatient  Remains inpatient appropriate because: Unsafe discharge plan.  Awaiting insurance approval and delivery of trilogy device for safe discharge     Level of care: Med-Surg  Consultants:  None  Procedures:  None  Antimicrobials: None   Subjective: Seen and examined.  Resting in bed.  No visible distress  Objective: Vitals:   08/21/21 1048 08/21/21 1200 08/21/21 1320 08/21/21 1400  BP: 104/68 (!) 113/49 (!) 106/49 (!) 105/53  Pulse: 97 (!) 104 94 92  Resp: (!) 22 (!) 28 (!) 28 19  Temp:  97.8 F (36.6 C)    TempSrc:  Oral    SpO2: 94% 98% 98% 98%  Weight:      Height:        Intake/Output Summary (Last 24 hours) at 08/21/2021 1414 Last data filed at 08/21/2021 1300 Gross per 24 hour  Intake 961.26 ml  Output 5650 ml  Net -4688.74 ml   Filed Weights   08/18/21 0402 08/19/21 0438 08/20/21 0617  Weight: 136 kg (!) 139.2 kg (!) 138.5 kg    Examination:  General  exam: No acute distress.  Chronically ill-appearing Respiratory system: Breath sounds decreased at bases.  Normal work of breathing.  2 L Cardiovascular system: S1-S2, no murmurs, no pedal edema Gastrointestinal system: Obese, NT/ND, normal bowel sounds Central nervous system: Alert and oriented. No focal neurological deficits. Extremities: Symmetric 5 x 5 power. Skin: No rashes, lesions or ulcers Psychiatry: Judgement and insight appear normal. Mood & affect appropriate.     Data Reviewed: I have personally reviewed following labs and imaging studies  CBC: Recent Labs  Lab 08/15/21 0500 08/16/21 0521 08/17/21 0546 08/20/21 0848  WBC 8.2 7.0 9.0 6.0  NEUTROABS  --  4.4  --  3.2  HGB 9.1* 8.7* 8.7* 9.2*  HCT 30.7* 30.2* 30.0* 32.6*  MCV 91.9 94.1 94.3 95.3  PLT 250 247 279 094   Basic Metabolic Panel: Recent Labs  Lab 08/15/21 0500 08/16/21 0521 08/17/21 0546 08/20/21 0848  NA 138 137 137 137  K 3.2* 4.0 5.0 3.5  CL 92* 91* 92* 85*  CO2 38* 37* 35* 43*  GLUCOSE 91 117* 135* 131*  BUN 8 6 <5* <5*  CREATININE 0.41* 0.32* 0.42* 0.40*  CALCIUM 9.2 9.4 9.7 10.0  MG 1.6* 1.7 1.5* 1.5*  PHOS 5.4* 5.0*  --   --    GFR: Estimated Creatinine Clearance: 193.7 mL/min (A) (by C-G formula based on SCr of 0.4 mg/dL (L)). Liver Function Tests: No results for input(s): AST, ALT, ALKPHOS, BILITOT, PROT, ALBUMIN in the last 168 hours. No results for input(s): LIPASE, AMYLASE in the last 168 hours. No results for input(s):  AMMONIA in the last 168 hours. Coagulation Profile: No results for input(s): INR, PROTIME in the last 168 hours. Cardiac Enzymes: Recent Labs  Lab 08/15/21 1255  CKTOTAL 56   BNP (last 3 results) No results for input(s): PROBNP in the last 8760 hours. HbA1C: No results for input(s): HGBA1C in the last 72 hours. CBG: Recent Labs  Lab 08/20/21 1854 08/20/21 2341 08/21/21 0416 08/21/21 0729 08/21/21 1129  GLUCAP 152* 173* 111* 133* 182*   Lipid  Profile: No results for input(s): CHOL, HDL, LDLCALC, TRIG, CHOLHDL, LDLDIRECT in the last 72 hours. Thyroid Function Tests: No results for input(s): TSH, T4TOTAL, FREET4, T3FREE, THYROIDAB in the last 72 hours. Anemia Panel: No results for input(s): VITAMINB12, FOLATE, FERRITIN, TIBC, IRON, RETICCTPCT in the last 72 hours. Sepsis Labs: Recent Labs  Lab 08/15/21 1245  PROCALCITON 0.15    Recent Results (from the past 240 hour(s))  CULTURE, BLOOD (ROUTINE X 2) w Reflex to ID Panel     Status: None   Collection Time: 08/15/21 12:45 PM   Specimen: BLOOD  Result Value Ref Range Status   Specimen Description BLOOD LEFT HAND  Final   Special Requests   Final    BOTTLES DRAWN AEROBIC AND ANAEROBIC Blood Culture adequate volume   Culture   Final    NO GROWTH 5 DAYS Performed at [facility], [address] [place], [place] [address]    Report Status 08/20/2021 FINAL  Final  CULTURE, BLOOD (ROUTINE X 2) w Reflex to ID Panel     Status: None   Collection Time: 08/15/21 12:46 PM   Specimen: BLOOD  Result Value Ref Range Status   Specimen Description BLOOD RIGHT HAND  Final   Special Requests   Final    BOTTLES DRAWN AEROBIC AND ANAEROBIC Blood Culture adequate volume   Culture   Final    NO GROWTH 5 DAYS Performed at [facility], [address] [place], [place] [address]    Report Status 08/20/2021 FINAL  Final  Resp Panel by RT-PCR (Flu A&B, Covid) Nasopharyngeal Swab     Status: None   Collection Time: 08/16/21  4:18 PM   Specimen: Nasopharyngeal Swab; Nasopharyngeal(NP) swabs in vial transport medium  Result Value Ref Range Status   SARS Coronavirus 2 by RT PCR NEGATIVE NEGATIVE Final    Comment: (NOTE) SARS-CoV-2 target nucleic acids are NOT DETECTED.  The SARS-CoV-2 RNA is generally detectable in upper respiratory specimens during the acute phase of infection. The lowest concentration of SARS-CoV-2 viral copies this assay can detect is 138 copies/mL.  A negative result does not preclude SARS-Cov-2 infection and should not be used as the sole basis for treatment or other patient management decisions. A negative result may occur with  improper specimen collection/handling, submission of specimen other than nasopharyngeal swab, presence of viral mutation(s) within the areas targeted by this assay, and inadequate number of viral copies(<138 copies/mL). A negative result must be combined with clinical observations, patient history, and epidemiological information. The expected result is Negative.  Fact Sheet for Patients:  EntrepreneurPulse.com.au  Fact Sheet for Healthcare Providers:  IncredibleEmployment.be  This test is no t yet approved or cleared by the [place] FDA and  has been authorized for detection and/or diagnosis of SARS-CoV-2 by FDA under an Emergency Use Authorization (EUA). This EUA will remain  in effect (meaning this test can be used) for the duration of the COVID-19 declaration under Section 564(b)(1) of the Act, 21 U.S.C.section 360bbb-3(b)(1), unless the  authorization is terminated  or revoked sooner.       Influenza A by PCR NEGATIVE NEGATIVE Final   Influenza B by PCR NEGATIVE NEGATIVE Final    Comment: (NOTE) The Xpert Xpress SARS-CoV-2/FLU/RSV plus assay is intended as an aid in the diagnosis of influenza from Nasopharyngeal swab specimens and should not be used as a sole basis for treatment. Nasal washings and aspirates are unacceptable for Xpert Xpress SARS-CoV-2/FLU/RSV testing.  Fact Sheet for Patients: EntrepreneurPulse.com.au  Fact Sheet for Healthcare Providers: IncredibleEmployment.be  This test is not yet approved or cleared by the [place] FDA and has been authorized for detection and/or diagnosis of SARS-CoV-2 by FDA under an Emergency Use Authorization (EUA). This EUA will remain in effect (meaning this test  can be used) for the duration of the COVID-19 declaration under Section 564(b)(1) of the Act, 21 U.S.C. section 360bbb-3(b)(1), unless the authorization is terminated or revoked.  Performed at [facility], [place], [place] [address]   Respiratory (~20 pathogens) panel by PCR     Status: None   Collection Time: 08/16/21  4:18 PM   Specimen: Nasopharyngeal Swab; Respiratory  Result Value Ref Range Status   Adenovirus NOT DETECTED NOT DETECTED Final   Coronavirus 229E NOT DETECTED NOT DETECTED Final    Comment: (NOTE) The Coronavirus on the Respiratory Panel, DOES NOT test for the novel  Coronavirus (2019 nCoV)    Coronavirus HKU1 NOT DETECTED NOT DETECTED Final   Coronavirus NL63 NOT DETECTED NOT DETECTED Final   Coronavirus OC43 NOT DETECTED NOT DETECTED Final   Metapneumovirus NOT DETECTED NOT DETECTED Final   Rhinovirus / Enterovirus NOT DETECTED NOT DETECTED Final   Influenza A NOT DETECTED NOT DETECTED Final   Influenza B NOT DETECTED NOT DETECTED Final   Parainfluenza Virus 1 NOT DETECTED NOT DETECTED Final   Parainfluenza Virus 2 NOT DETECTED NOT DETECTED Final   Parainfluenza Virus 3 NOT DETECTED NOT DETECTED Final   Parainfluenza Virus 4 NOT DETECTED NOT DETECTED Final   Respiratory Syncytial Virus NOT DETECTED NOT DETECTED Final   Bordetella pertussis NOT DETECTED NOT DETECTED Final   Bordetella Parapertussis NOT DETECTED NOT DETECTED Final   Chlamydophila pneumoniae NOT DETECTED NOT DETECTED Final   Mycoplasma pneumoniae NOT DETECTED NOT DETECTED Final    Comment: Performed at [facility], [place]. [address], [place], [place] [address]         Radiology Studies: No results found.      Scheduled Meds:  acetaZOLAMIDE  250 mg Oral BID   [START ON 08/22/2021] allopurinol  300 mg Oral Daily   arformoterol  15 mcg Nebulization BID   vitamin C  500 mg Oral BID   budesonide (PULMICORT) nebulizer solution  0.25 mg Nebulization BID    cefadroxil  1,000 mg Oral BID   Chlorhexidine Gluconate Cloth  6 each Topical Daily   clonazePAM  1 mg Oral BID   clotrimazole   Topical BID   docusate sodium  100 mg Oral BID   DULoxetine  60 mg Oral Daily   [START ON 08/22/2021] furosemide  40 mg Oral BID   insulin aspart  0-20 Units Subcutaneous TID WC   insulin aspart  0-5 Units Subcutaneous QHS   lidocaine  1 patch Transdermal Q24H   mouth rinse  15 mL Mouth Rinse BID   metoprolol succinate  100 mg Oral Daily   multivitamin with minerals  1 tablet Oral Daily   nystatin   Topical  BID   pantoprazole  40 mg Oral QHS   polyethylene glycol  17 g Oral Daily   pregabalin  200 mg Oral BID   rivaroxaban  20 mg Oral Daily   valproic acid  500 mg Oral TID   Continuous Infusions:     LOS: 11 days    Time spent: 15 minutes    Sidney Ace, MD [facility]   If 7PM-7AM, please contact night-coverage  08/21/2021, 2:14 PM

## 2021-08-21 NOTE — Progress Notes (Signed)
Pt transferred to 129, report given to [employer]. Family and pt updated with plan of care.

## 2021-08-21 NOTE — TOC Progression Note (Signed)
Transition of Care [facility]) - Progression Note    Patient Details  Name: Craig [name] MRN: [id number] Date of Birth: Dec 01, 1990  Transition of [facility]) CM/SW Contact  Shelbie Hutching, RN Phone Number: 08/21/2021, 10:07 AM  Clinical Narrative:    Insurance authorization is still pending for NIV.  Zach with [employer] will have his team reach out to [facility] and update me when he hears something from them.     Expected Discharge Plan:  (TBD) Barriers to Discharge: Equipment Delay  Expected Discharge Plan and Services Expected Discharge Plan:  (TBD)         Expected Discharge Date: 08/17/21               DME Arranged: NIV DME Agency: [employer] Date DME Agency Contacted: 08/17/21 Time DME Agency Contacted: [id number] Representative spoke with at DME Agency: [place]: PT, OT [place] Agency: [place] Date [place]: 08/17/21 Time Center: 1156 Representative spoke with at [place]: [employer]   Social Determinants of Health ([place]) Interventions    Readmission Risk Interventions Readmission Risk Prevention Plan 07/10/2021  Transportation Screening Complete  PCP or Specialist Appt within 3-5 Days Complete  HRI or [place] Complete  Social Work Consult for [place] Planning/Counseling [place] Not Applicable  Medication Review [employer]) Complete  Some recent data might be hidden

## 2021-08-22 DIAGNOSIS — J9621 Acute and chronic respiratory failure with hypoxia: Secondary | ICD-10-CM | POA: Diagnosis not present

## 2021-08-22 DIAGNOSIS — J9622 Acute and chronic respiratory failure with hypercapnia: Secondary | ICD-10-CM | POA: Diagnosis not present

## 2021-08-22 LAB — BASIC METABOLIC PANEL
Anion gap: 7 (ref 5–15)
BUN: <5 mg/dL — ABNORMAL LOW (ref 6–20)
CO2: 41 mmol/L — ABNORMAL HIGH (ref 22–32)
Calcium: 9.1 mg/dL (ref 8.9–10.3)
Chloride: 90 mmol/L — ABNORMAL LOW (ref 98–111)
Creatinine, Ser: 0.42 mg/dL — ABNORMAL LOW (ref 0.61–1.24)
GFR, Estimated: >60 mL/min (ref >60)
Glucose, Bld: 105 mg/dL — ABNORMAL HIGH (ref 70–99)
Potassium: 3.2 mmol/L — ABNORMAL LOW (ref 3.5–5.1)
Sodium: 138 mmol/L (ref 135–145)

## 2021-08-22 LAB — GLUCOSE, CAPILLARY
Glucose-Capillary: 109 mg/dL — ABNORMAL HIGH (ref 70–99)
Glucose-Capillary: 209 mg/dL — ABNORMAL HIGH (ref 70–99)
Glucose-Capillary: 231 mg/dL — ABNORMAL HIGH (ref 70–99)
Glucose-Capillary: 124 mg/dL — ABNORMAL HIGH (ref 70–99)

## 2021-08-22 MED ORDER — ACETAZOLAMIDE 250 MG PO TABS
250.0000 mg | ORAL_TABLET | Freq: Once | ORAL | Status: AC
  Administered 2021-08-22: 250 mg via ORAL
  Filled 2021-08-22: qty 1

## 2021-08-22 MED ORDER — POTASSIUM CHLORIDE CRYS ER 20 MEQ PO TBCR
40.0000 meq | EXTENDED_RELEASE_TABLET | Freq: Once | ORAL | Status: AC
  Administered 2021-08-22: 40 meq via ORAL
  Filled 2021-08-22: qty 2

## 2021-08-22 NOTE — Progress Notes (Signed)
Physical Therapy Treatment Patient Details Name: Craig [name] MRN: [id number] DOB: 07/18/1991 Today's Date: 08/22/2021   History of Present Illness Craig [name] is a 89yoM who comes to [facility] on 12/22 c SOB. PMH: inflammatory arthritis, DVT on xarelto, COVID19 infection c prolonged critical illness Dec '21-Feb '22, post covid tachycardia, CRF on 2L O2, bactremia, PNA, ostomy, gout. Pt appeared to sustain tonic clonc seizure en route to ED. Pt required intubation upon arrival. Pt successfully extubated on 12/26.   PT Comments    Patient is agreeable to PT. Mother in the room feeding the patient on arrival to the room. Patient required +2 person assistance for bed mobility and was able to sit up on edge of bed for ~6 minutes (2 minutes unsupported, otherwise minimal assistance to maintain midline). Activity tolerance continues to be limited by fatigue with activity.  Educated patient on importance of increasing AROM of extremities and importance of positioning for skin integrity/edema management. Recommend to continue PT to maximize independence and decrease caregiver burden.    Recommendations for follow up therapy are one component of a multi-disciplinary discharge planning process, led by the attending physician.  Recommendations may be updated based on patient status, additional functional criteria and insurance authorization.  Follow Up Recommendations  Home health PT     Assistance Recommended at Discharge Frequent or constant Supervision/Assistance  Patient can return home with the following Two people to help with walking and/or transfers;A lot of help with bathing/dressing/bathroom;Direct supervision/assist for medications management;Direct supervision/assist for financial management;Help with stairs or ramp for entrance;Assist for transportation;Assistance with feeding;Assistance with cooking/housework   Equipment Recommendations  None recommended by PT    Recommendations for  Other Services       Precautions / Restrictions Precautions Precautions: Fall Restrictions Weight Bearing Restrictions: No     Mobility  Bed Mobility Overal bed mobility: Needs Assistance Bed Mobility: Supine to Sit;Sit to Supine     Supine to sit: Max assist;+2 for physical assistance Sit to supine: Max assist;+2 for physical assistance   General bed mobility comments: assistance for trunk and BLE support. verbal cues for task initiation and sequencing. increased time and effort required during activity    Transfers                   General transfer comment: unable to progress to standing due to poor sitting tolerance and generalized weakness    Ambulation/Gait                   Stairs             Wheelchair Mobility    Modified Rankin (Stroke Patients Only)       Balance Overall balance assessment: Needs assistance Sitting-balance support: Single extremity supported;No upper extremity supported Sitting balance-Leahy Scale: Fair Sitting balance - Comments: patient dangled for ~ 6 minutes total with emphasis on maintaining midline sitting balance. patient was able to sit unsupported for ~ 2 minutes and otherwise needed minimal assistance due to posterior lean/fatigue with activity. unable to sit longer due to abdominal discomfort reported after prolonged sititng Postural control: Posterior lean                                  Cognition Arousal/Alertness: Awake/alert;Lethargic Behavior During Therapy: WFL for tasks assessed/performed Overall Cognitive Status: Within Functional Limits for tasks assessed  General Comments: patient able to follow single step commands with extra time        Exercises General Exercises - Lower Extremity Ankle Circles/Pumps: AAROM;Strengthening;Both;10 reps;Supine Ankle Circles/Pumps Limitations: unable to achieve neutral positioning    General  Comments General comments (skin integrity, edema, etc.): patient educated on positioning of UE to prevent against skin or joint injury due to reported decreased sensation in left wrist/hand. also educated patient on importance of elevating extremities/increasing ROM for edema management. Sp02 84% after sitting up on edge of bed on 2 L02. Dr Priscella Mann in room and reports patient okay to be increased to 3 L02. with cues for breathing techniques, Sp02 increased to the upper 80's after several minutes.      Pertinent Vitals/Pain Pain Assessment: 0-10 Pain Score: 4  Pain Location: right knee, left shoulder, generalized pain in most joints with range of motion Pain Descriptors / Indicators: Sore Pain Intervention(s): Limited activity within patient's tolerance;Monitored during session    Home Living                          Prior Function            PT Goals (current goals can now be found in the care plan section) Acute Rehab PT Goals Patient Stated Goal: return  home PT Goal Formulation: With patient/family Time For Goal Achievement: 08/29/21 Potential to Achieve Goals: Fair Progress towards PT goals: Progressing toward goals    Frequency    Min 2X/week      PT Plan Current plan remains appropriate    Co-evaluation PT/OT/SLP Co-Evaluation/Treatment: Yes Reason for Co-Treatment: To address functional/ADL transfers;For patient/therapist safety PT goals addressed during session: Mobility/safety with mobility OT goals addressed during session: ADL's and self-care      AM-PAC PT "6 Clicks" Mobility   Outcome Measure  Help needed turning from your back to your side while in a flat bed without using bedrails?: Total Help needed moving from lying on your back to sitting on the side of a flat bed without using bedrails?: Total Help needed moving to and from a bed to a chair (including a wheelchair)?: Total Help needed standing up from a chair using your arms (e.g.,  wheelchair or bedside chair)?: Total Help needed to walk in hospital room?: Total Help needed climbing 3-5 steps with a railing? : Total 6 Click Score: 6    End of Session Equipment Utilized During Treatment: Oxygen Activity Tolerance: Patient limited by fatigue;Patient limited by pain Patient left: in bed;with call bell/phone within reach;with bed alarm set;with family/visitor present Nurse Communication: Mobility status PT Visit Diagnosis: Unsteadiness on feet (R26.81);Muscle weakness (generalized) (M62.81);Difficulty in walking, not elsewhere classified (R26.2) Pain - Right/Left: Right Pain - part of body: Knee     Time: 4854-6270 PT Time Calculation (min) (ACUTE ONLY): 33 min  Charges:  $Therapeutic Activity: 8-22 mins                     Minna Merritts, PT, MPT    [name] 08/22/2021, 1:56 PM

## 2021-08-22 NOTE — Progress Notes (Signed)
[place] [facility]) Hospital Liaison Note  [facility] hospital liaison continues to follow patient for discharge disposition and will initiate outpatient palliative services at discharge.  Please call with any hospice or outpatient palliative care related questions.  Thank you for the opportunity to participate in this patient's care.  Nadene Rubins, RN, BSN [place] [phone]

## 2021-08-22 NOTE — Progress Notes (Signed)
Nutrition Follow-up  DOCUMENTATION CODES:   Morbid obesity  INTERVENTION:   -Continue MVI with minerals daily -Continue Magic cup TID with meals, each supplement provides 290 kcal and 9 grams of protein    NUTRITION DIAGNOSIS:   Inadequate oral intake related to inability to eat as evidenced by NPO status.  Progressing; advanced to regular diet on 08/14/21  GOAL:   Patient will meet greater than or equal to 90% of their needs  Progressing   MONITOR:   PO intake, Supplement acceptance, Labs, Weight trends, Skin, I & O's  REASON FOR ASSESSMENT:   Ventilator    ASSESSMENT:   31 y.o. 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  12/26- extubated 12/28- knee aspiration attempted x 2 unsuccessfully  Reviewed I/O's: -2.8 L x 24 hours and -21.1 L since admission  UOP: 4.2 L x 24 hours  Pt with improved meal completions; noted meal completions 100%.    Per TOC notes, pt is medically stable for discharge and awaiting insurance authorization for NIV.   Medications reviewed and include vitamin C, colace, lasix, and miralax.   Labs reviewed: K: 3.2, CBGS: 109-182 (inpatient orders for glycemic control are 0-5 units insulin aspart daily at bedtime).    Diet Order:   Diet Order             Diet - low sodium heart healthy           Diet regular Room service appropriate? Yes; Fluid consistency: Thin   Diet effective now                   EDUCATION NEEDS:   Not appropriate for education at this time  Skin:  Skin Integrity Issues:: Stage II, Incisions Stage II: coccyx Incisions: closed rt knee  Last BM:  08/21/21 (100 ml via colostomy)  Height:   Ht Readings from Last 1 Encounters:  08/14/21 6' 0.99" (1.854 m)    Weight:   Wt Readings from Last 1 Encounters:  08/21/21 (!) 137.9 kg    Ideal Body Weight:  83.6 kg  BMI:  Body mass index is 40.12 kg/m.  Estimated Nutritional Needs:   Kcal:  2300-2500  Protein:  125-150 grams  Fluid:  > 2 L    Loistine Chance, RD, LDN, [place] Registered Dietitian II Certified Diabetes Care and Education Specialist Please refer to [facility] for RD and/or RD on-call/weekend/after hours pager

## 2021-08-22 NOTE — TOC Initial Note (Addendum)
Transition of Care [facility]) - Initial/Assessment Note    Patient Details  Name: Craig [name] MRN: [id number] Date of Birth: February 17, 1991  Transition of Care [facility]) CM/SW Contact:    Pete Pelt, RN Phone Number: 08/22/2021, 10:16 AM  Clinical Narrative:       RNCM spoke to [employer], Auth still pending for NIV.  He will contact TOC when auth received.  Message relayed to care team.  TOC to follow            1640 patient received approval for NIV, can be delivered tomorrow.  EMS home tomorrow.  Expected Discharge Plan:  (TBD) Barriers to Discharge: Equipment Delay   Patient Goals and CMS Choice Patient states their goals for this hospitalization and ongoing recovery are:: glad to be going home CMS Medicare.gov Compare Post Acute Care list provided to:: Patient Choice offered to / list presented to : Patient, Spouse  Expected Discharge Plan and Services Expected Discharge Plan:  (TBD)         Expected Discharge Date: 08/17/21               DME Arranged: NIV DME Agency: [employer] Date DME Agency Contacted: 08/17/21 Time DME Agency Contacted: 3545 Representative spoke with at DME Agency: [place]: PT, OT [place] Agency: [place] Date [place]: 08/17/21 Time [place]: 1156 Representative spoke with at [place]: Minette Brine [phone]  Prior Living Arrangements/Services                       Activities of Daily Living Home Assistive Devices/Equipment: Eyeglasses, Oxygen ADL Screening (condition at time of admission) Patient's cognitive ability adequate to safely complete daily activities?: Yes Is the patient deaf or have difficulty hearing?: No Does the patient have difficulty seeing, even when wearing glasses/contacts?: No Does the patient have difficulty concentrating, remembering, or making decisions?: No Patient able to express need for assistance with ADLs?: Yes Does the patient have difficulty dressing or  bathing?: No Independently performs ADLs?: Yes (appropriate for developmental age) Does the patient have difficulty walking or climbing stairs?: Yes Weakness of Legs: Both Weakness of Arms/Hands: None  Permission Sought/Granted                  Emotional Assessment         Alcohol / Substance Use: Not Applicable Psych Involvement: No (comment)  Admission diagnosis:  Acute respiratory failure with hypoxia ([place]) [J96.01] Acute on chronic respiratory failure with hypoxia and hypercapnia (HCC) [J96.21, J96.22] Sepsis, due to unspecified organism, unspecified whether acute organ dysfunction present [facility]) [A41.9] Patient Active Problem List   Diagnosis Date Noted   Acute on chronic respiratory failure with hypoxia and hypercapnia ([place]) 08/10/2021   Acute on chronic respiratory failure with hypoxemia ([place]) 07/08/2021   Neuropathic pain, arm 10/20/2020   Pressure injury of skin 09/18/2020   Staphylococcus aureus bacteremia    Fever    Metabolic encephalopathy    Acute respiratory failure with hypoxia ([place])    Acute hypoxemic respiratory failure due to COVID-19 ([place]) 08/09/2020   Severe sepsis ([place]) 08/09/2020   AKI (acute kidney injury) ([place]) 08/09/2020   Hypertension    Leak of anastomosis between gastrointestinal structures 07/08/2020   Pneumoperitoneum 07/08/2020   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 [place] [address] Phone: [phone] Fax: [phone]  [id number] [place], [address] [place] [address] Phone: [phone] Fax: [phone]     Social Determinants of Health ([place]) Interventions    Readmission Risk  Interventions Readmission Risk Prevention Plan 07/10/2021  Transportation Screening Complete  PCP or Specialist Appt within 3-5 Days Complete  HRI or [place] Complete  Social Work Consult for [place] Planning/Counseling Complete  Palliative Care Screening Not Applicable  Medication Review Press photographer) Complete  Some recent data might be hidden

## 2021-08-22 NOTE — Progress Notes (Signed)
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  On arrival to the ED, patient was being bagged by EMS to maintain SPO2 in the low 90s.  He was noted to have an elevated WBC count of 22, tachypneic, tachycardic and hypoxia with concern for sepsis secondary to pneumonia in the setting of seizure activity.  Patient was started on empiric antibiotics and intubated by EDP for severe hypoxic/hypercarbic respite failure in the setting of obesity, OHS, severe deconditioning..  Patient was initially admitted to the critical care service.  Patient was extubated on 08/14/2021.  Patient was transferred to hospital service on 12/27.  Remains medically stable for discharge.  Discharge has been delayed by lack of insurance approval and delivery of home trilogy NIV   Assessment & Plan:   Principal Problem:   Acute on chronic respiratory failure with hypoxia and hypercapnia (HCC)  Acute on chronic hypoxic and hypercarbic respiratory failure, POA Morbid obesity with obesity hypoventilation, severe deconditioning Patient with history of severe Covid-19 pneumonia initially requiring tracheostomy which has subsequently been decannulated, MSSA pneumonia, Morbid obesity, probable OHS/OSA and acute worsening  likely secondary to multifactorial etiology from sepsis, OSA, OHS.  Was initially admitted by [facility] service after being intubated by ED physician.  Was successfully extubated on 08/14/2021 to BiPAP. --Continue BiPAP nightly, social work assisting with NIV for home use --Brovana neb BID --Pulmicort neb BID --Nasal cannula during the day, maintain SPO2 greater than 92% --Aggressive pulmonary hygiene --Awaiting insurance authorization in order for [employer] to deliver trilogy NIV; as without this, patient has a very high bounce back potential given his brittle respiratory status -- Discussed disposition with case management on 1/3.  Unfortunately insurance authorization for home NIV is still pending   Sepsis 2/2 MSSA bacteremia with MSSA right knee septic arthritis s/p I&D x2 Patient seen by ID on 12/20. Was to complete IV Cefazolin on 12/22 then plan to transition to Cefadroxil continue IV cefazolin while inpatient.  TEE 07/12/2021 negative.  Now with recurrent fever with T-max 100.9 on 08/15/2021. --Infectious disease following, appreciate assistance --Blood cultures 12/21 with no growth x5 days --Repeat blood cultures 12/27: No growth x 4 days --PICC line removed 12/28 --Cefadroxil 1 g p.o. twice daily until 09/11/21 ID   Acute metabolic encephalopathy likely multifactorial in the setting of sepsis Baseline issues with anxiety/impulsivity CT Head negative for acute abnormality, Limited MRI negative for acute abnormality. EEG without seizure activity --Depakote 500 mg TID --Klonopin 1 mg BID --Ativan PRN --Avoid fentanyl due to paradoxic agitation -- Seroquel discontinued   Seizure Like Activity Similar presentation during last admission, Tramadol was previously discontinued due to suspected seizures, it appears that this was restarted as outpatient, now [place]. CT Head shows no acute intracranial abnormality. MRI shows no acute abnormality though limited study. EEG showed no seizure  activity. May have been myoclonic activity due to hypoxia. --Depakene 500 mg q8h --Seizure precautions  H/o upper extremity DVT It appears that he was started on Xarelto before around Feb/Mar 2022 for provoked upper extremity DVT. CTA chest neg for PE --Continue Xarelto    Anasarca --net negative 1.5L past 24h, net negative 2.8L since admission --Furosemide 40 mg IV every other day   Diabetes mellitus Last HgbA1c 6.8 on 07/08/21.  Diet controlled at home. --Resistant SSI for coverage --CBGs qAC/HS   Sinus tachycardia --Metoprolol succinate 100 mg p.o. daily   Morbid obesity Body mass index is 40.5 kg/m.  Discussed with patient needs for aggressive lifestyle changes/weight loss as this complicates all facets of care.  Outpatient follow-up with PCP.  May benefit from bariatric evaluation outpatient.   Weakness/deconditioning/debility: --PT/OT recommending home health on discharge --Continue therapy efforts while inpatient   DVT prophylaxis: Xarelto Code Status: Full Family Communication: Mother at bedside 1/3 Disposition Plan: Status is: Inpatient  Remains inpatient appropriate because: Unsafe discharge plan.  Awaiting insurance approval and delivery of trilogy device for safe discharge.  Patient is medically stable otherwise     Level of care: Med-Surg  Consultants:  None  Procedures:  None  Antimicrobials: None   Subjective: Seen and examined.  Resting in bed.  Winded as he just worked with therapy.  Answer questions appropriately.  States he feels well  Objective: Vitals:   08/21/21 1942 08/21/21 2002 08/22/21 0527 08/22/21 0823  BP:  (!) 108/50 131/64 (!) 118/58  Pulse:  (!) 104 95 96  Resp:  20 19 20   Temp:  98.5 F (36.9 C) 98.2 F (36.8 C) 98.3 F (36.8 C)  TempSrc:  Oral  Oral  SpO2: 90% 92% 100% 100%  Weight:      Height:        Intake/Output Summary (Last 24 hours) at 08/22/2021 1147 Last data filed at 08/21/2021 2300 Gross per 24 hour   Intake 890 ml  Output 2250 ml  Net -1360 ml   Filed Weights   08/19/21 0438 08/20/21 0617 08/21/21 0900  Weight: (!) 139.2 kg (!) 138.5 kg (!) 137.9 kg    Examination:  General exam: No acute distress.  Chronically ill-appearing Respiratory system: Breath sounds diminished at bases.  Normal work of breathing.  2 L Cardiovascular system: S1-S2, no murmurs, no pedal edema Gastrointestinal system: Obese, NT/ND, normal bowel sounds, colostomy in place, brown stool in bag Central nervous system: Alert and oriented. No focal neurological deficits. Extremities: Symmetric 5 x 5 power. Skin: No rashes, lesions or ulcers Psychiatry: Judgement and insight appear normal. Mood & affect appropriate.     Data Reviewed: I have personally reviewed following labs and imaging studies  CBC: Recent Labs  Lab 08/16/21 0521 08/17/21 0546 08/20/21 0848  WBC 7.0 9.0 6.0  NEUTROABS 4.4  --  3.2  HGB 8.7* 8.7* 9.2*  HCT 30.2* 30.0* 32.6*  MCV 94.1 94.3 95.3  PLT 247 279 073   Basic Metabolic Panel: Recent Labs  Lab 08/16/21 0521 08/17/21 0546 08/20/21 0848 08/22/21 0405  NA 137 137 137 138  K 4.0 5.0 3.5 3.2*  CL 91* 92* 85* 90*  CO2 37* 35* 43* 41*  GLUCOSE 117* 135* 131* 105*  BUN 6 <5* <5* <5*  CREATININE 0.32* 0.42* 0.40* 0.42*  CALCIUM 9.4 9.7 10.0 9.1  MG 1.7 1.5* 1.5*  --   PHOS 5.0*  --   --   --    GFR: Estimated Creatinine Clearance: 193.3 mL/min (A) (by C-G formula based on SCr of 0.42 mg/dL (L)). Liver Function  Tests: No results for input(s): AST, ALT, ALKPHOS, BILITOT, PROT, ALBUMIN in the last 168 hours. No results for input(s): LIPASE, AMYLASE in the last 168 hours. No results for input(s): AMMONIA in the last 168 hours. Coagulation Profile: No results for input(s): INR, PROTIME in the last 168 hours. Cardiac Enzymes: Recent Labs  Lab 08/15/21 1255  CKTOTAL 56   BNP (last 3 results) No results for input(s): PROBNP in the last 8760 hours. HbA1C: No results  for input(s): HGBA1C in the last 72 hours. CBG: Recent Labs  Lab 08/21/21 0729 08/21/21 1129 08/21/21 1531 08/21/21 2047 08/22/21 0820  GLUCAP 133* 182* 151* 147* 109*   Lipid Profile: No results for input(s): CHOL, HDL, LDLCALC, TRIG, CHOLHDL, LDLDIRECT in the last 72 hours. Thyroid Function Tests: No results for input(s): TSH, T4TOTAL, FREET4, T3FREE, THYROIDAB in the last 72 hours. Anemia Panel: No results for input(s): VITAMINB12, FOLATE, FERRITIN, TIBC, IRON, RETICCTPCT in the last 72 hours. Sepsis Labs: Recent Labs  Lab 08/15/21 1245  PROCALCITON 0.15    Recent Results (from the past 240 hour(s))  CULTURE, BLOOD (ROUTINE X 2) w Reflex to ID Panel     Status: None   Collection Time: 08/15/21 12:45 PM   Specimen: BLOOD  Result Value Ref Range Status   Specimen Description BLOOD LEFT HAND  Final   Special Requests   Final    BOTTLES DRAWN AEROBIC AND ANAEROBIC Blood Culture adequate volume   Culture   Final    NO GROWTH 5 DAYS Performed at [facility], [address] [place], [place] [address]    Report Status 08/20/2021 FINAL  Final  CULTURE, BLOOD (ROUTINE X 2) w Reflex to ID Panel     Status: None   Collection Time: 08/15/21 12:46 PM   Specimen: BLOOD  Result Value Ref Range Status   Specimen Description BLOOD RIGHT HAND  Final   Special Requests   Final    BOTTLES DRAWN AEROBIC AND ANAEROBIC Blood Culture adequate volume   Culture   Final    NO GROWTH 5 DAYS Performed at [facility], [address] [place],  [address]    Report Status 08/20/2021 FINAL  Final  Resp Panel by RT-PCR (Flu A&B, Covid) Nasopharyngeal Swab     Status: None   Collection Time: 08/16/21  4:18 PM   Specimen: Nasopharyngeal Swab; Nasopharyngeal(NP) swabs in vial transport medium  Result Value Ref Range Status   SARS Coronavirus 2 by RT PCR NEGATIVE NEGATIVE Final    Comment: (NOTE) SARS-CoV-2 target nucleic acids are NOT DETECTED.  The SARS-CoV-2 RNA  is generally detectable in upper respiratory specimens during the acute phase of infection. The lowest concentration of SARS-CoV-2 viral copies this assay can detect is 138 copies/mL. A negative result does not preclude SARS-Cov-2 infection and should not be used as the sole basis for treatment or other patient management decisions. A negative result may occur with  improper specimen collection/handling, submission of specimen other than nasopharyngeal swab, presence of viral mutation(s) within the areas targeted by this assay, and inadequate number of viral copies(<138 copies/mL). A negative result must be combined with clinical observations, patient history, and epidemiological information. The expected result is Negative.  Fact Sheet for Patients:  EntrepreneurPulse.com.au  Fact Sheet for Healthcare Providers:  IncredibleEmployment.be  This test is no t yet approved or cleared by the [place] FDA and  has been authorized for detection and/or diagnosis of SARS-CoV-2 by FDA under an Emergency Use Authorization (EUA).  This EUA will remain  in effect (meaning this test can be used) for the duration of the COVID-19 declaration under Section 564(b)(1) of the Act, 21 U.S.C.section 360bbb-3(b)(1), unless the authorization is terminated  or revoked sooner.       Influenza A by PCR NEGATIVE NEGATIVE Final   Influenza B by PCR NEGATIVE NEGATIVE Final    Comment: (NOTE) The Xpert Xpress SARS-CoV-2/FLU/RSV plus assay is intended as an aid in the diagnosis of influenza from Nasopharyngeal swab specimens and should not be used as a sole basis for treatment. Nasal washings and aspirates are unacceptable for Xpert Xpress SARS-CoV-2/FLU/RSV testing.  Fact Sheet for Patients: EntrepreneurPulse.com.au  Fact Sheet for Healthcare Providers: IncredibleEmployment.be  This test is not yet approved or cleared by the  [place] FDA and has been authorized for detection and/or diagnosis of SARS-CoV-2 by FDA under an Emergency Use Authorization (EUA). This EUA will remain in effect (meaning this test can be used) for the duration of the COVID-19 declaration under Section 564(b)(1) of the Act, 21 U.S.C. section 360bbb-3(b)(1), unless the authorization is terminated or revoked.  Performed at [facility], [address], [place] [address]   Respiratory (~20 pathogens) panel by PCR     Status: None   Collection Time: 08/16/21  4:18 PM   Specimen: Nasopharyngeal Swab; Respiratory  Result Value Ref Range Status   Adenovirus NOT DETECTED NOT DETECTED Final   Coronavirus 229E NOT DETECTED NOT DETECTED Final    Comment: (NOTE) The Coronavirus on the Respiratory Panel, DOES NOT test for the novel  Coronavirus (2019 nCoV)    Coronavirus HKU1 NOT DETECTED NOT DETECTED Final   Coronavirus NL63 NOT DETECTED NOT DETECTED Final   Coronavirus OC43 NOT DETECTED NOT DETECTED Final   Metapneumovirus NOT DETECTED NOT DETECTED Final   Rhinovirus / Enterovirus NOT DETECTED NOT DETECTED Final   Influenza A NOT DETECTED NOT DETECTED Final   Influenza B NOT DETECTED NOT DETECTED Final   Parainfluenza Virus 1 NOT DETECTED NOT DETECTED Final   Parainfluenza Virus 2 NOT DETECTED NOT DETECTED Final   Parainfluenza Virus 3 NOT DETECTED NOT DETECTED Final   Parainfluenza Virus 4 NOT DETECTED NOT DETECTED Final   Respiratory Syncytial Virus NOT DETECTED NOT DETECTED Final   Bordetella pertussis NOT DETECTED NOT DETECTED Final   Bordetella Parapertussis NOT DETECTED NOT DETECTED Final   Chlamydophila pneumoniae NOT DETECTED NOT DETECTED Final   Mycoplasma pneumoniae NOT DETECTED NOT DETECTED Final    Comment: Performed at [facility], [facility], [facility], [place]. [address] [place], [place] [address]         Radiology Studies: No results found.      Scheduled Meds:  acetaZOLAMIDE  250 mg Oral BID    allopurinol  300 mg Oral Daily   arformoterol  15 mcg Nebulization BID   vitamin C  500 mg Oral BID   budesonide (PULMICORT) nebulizer solution  0.25 mg Nebulization BID   cefadroxil  1,000 mg Oral BID   Chlorhexidine Gluconate Cloth  6 each Topical Daily   clonazePAM  1 mg Oral BID   clotrimazole   Topical BID   docusate sodium  100 mg Oral BID   DULoxetine  60 mg Oral Daily   furosemide  40 mg Oral BID   insulin aspart  0-20 Units Subcutaneous TID WC   insulin aspart  0-5 Units Subcutaneous QHS   lidocaine  1 patch Transdermal Q24H   mouth rinse  15 mL Mouth Rinse BID  metoprolol succinate  100 mg Oral Daily   multivitamin with minerals  1 tablet Oral Daily   nystatin   Topical BID   pantoprazole  40 mg Oral QHS   polyethylene glycol  17 g Oral Daily   pregabalin  200 mg Oral BID   rivaroxaban  20 mg Oral Daily   valproic acid  500 mg Oral TID   Continuous Infusions:     LOS: 12 days    Time spent: 15 minutes    Sidney Ace, MD [facility]   If 7PM-7AM, please contact night-coverage  08/22/2021, 11:47 AM

## 2021-08-22 NOTE — Progress Notes (Signed)
Occupational Therapy Treatment Patient Details Name: JOBY RICHART MRN: [id number] DOB: Feb 16, 1991 Today's Date: 08/22/2021   History of present illness Tanvir Hipple is a 26yoM who comes to [facility] on 12/22 c SOB. PMH: inflammatory arthritis, DVT on xarelto, COVID19 infection c prolonged critical illness Dec '21-Feb '22, post covid tachycardia, CRF on 2L O2, bactremia, PNA, ostomy, gout. Pt appeared to sustain tonic clonc seizure en route to ED. Pt required intubation upon arrival. Pt successfully extubated on 12/26.   OT comments  Pt seen for OT/PT co-treatment on this date. Upon arrival to room, pt awake and sitting upright in bed with mother providing total assist for feeding. Pt reports having difficulty feeding self d/t shoulder pain with movement. Pt educated on supportive positioning for feeding (e.g., placing pillow under arms and feeding self via elbow flex/ext). Pt demonstrated good understanding of education provided and was able to feed self 3x without spillage following MIN A for loading food on fork. Pt also noted to have b/l UE edema. Patient and family educated on importance of elevating extremities and participating in AROM exercises for edema management. Pt verbalized understanding. This date, pt required MAX A+2 for supine<>sit transfer and MIN GUARD-MIN A to sit EOB for 6 mins d/t decreased strength and activity tolerance (see PT note for more information on mobility portion of session). Pt is making good progress toward goals and continues to benefit from skilled OT services to maximize return to PLOF and minimize risk of future falls, injury, caregiver burden, and readmission. Will continue to follow POC. Discharge recommendation remains appropriate.     Recommendations for follow up therapy are one component of a multi-disciplinary discharge planning process, led by the attending physician.  Recommendations may be updated based on patient status, additional functional criteria  and insurance authorization.    Follow Up Recommendations  Home health OT    Assistance Recommended at Discharge Frequent or constant Supervision/Assistance  Patient can return home with the following  Two people to help with walking and/or transfers;Two people to help with bathing/dressing/bathroom   Equipment Recommendations  None recommended by OT (pt has all necessary DME)       Precautions / Restrictions Precautions Precautions: Fall Precaution Comments: ostomy on left Restrictions Weight Bearing Restrictions: No RLE Weight Bearing: Weight bearing as tolerated       Mobility Bed Mobility Overal bed mobility: Needs Assistance Bed Mobility: Supine to Sit;Sit to Supine     Supine to sit: Max assist;+2 for physical assistance Sit to supine: Max assist;+2 for physical assistance   General bed mobility comments: assistance for trunk and BLE support. verbal cues for task initiation and sequencing. increased time and effort required during activity    Transfers                   General transfer comment: unable to progress to standing due to poor sitting tolerance and generalized weakness     Balance Overall balance assessment: Needs assistance Sitting-balance support: Single extremity supported;No upper extremity supported Sitting balance-Leahy Scale: Fair Sitting balance - Comments: patient dangled for ~ 6 minutes total with emphasis on maintaining midline sitting balance. patient was able to sit unsupported for ~ 2 minutes and otherwise needed minimal assistance due to posterior lean/fatigue with activity. unable to sit longer due to abdominal discomfort reported after prolonged sititng Postural control: Posterior lean   Standing balance-Leahy Scale: Zero Standing balance comment: unable/unsafe to attempt this date  ADL either performed or assessed with clinical judgement   ADL Overall ADL's : Needs  assistance/impaired Eating/Feeding: Minimal assistance;Sitting Eating/Feeding Details (indicate cue type and reason): MIN A to load fork with food. Pt able to bring to mouth 3x without spillage, however endorses fatigue. Pt encouraged to feed self as much as possible to prevent furhter deconditioning                                   General ADL Comments: pt is MAX A+2 for bed mobility. Seated static balance with min guard-MIN A and able to tolerate for ~6 minutes      Cognition Arousal/Alertness: Awake/alert Behavior During Therapy: WFL for tasks assessed/performed Overall Cognitive Status: Within Functional Limits for tasks assessed                                 General Comments: patient able to follow single step commands with extra time          Exercises General Exercises - Upper Extremity Elbow Flexion: AROM;Both;10 reps;Supine Elbow Extension: AROM;Both;10 reps;Supine Wrist Flexion: AROM;Both;10 reps;Supine Wrist Extension: AROM;Both;10 reps;Supine  Other Exercises: Patient educated patient on importance of elevating extremities/increasing ROM for edema management.      General Comments Sp02 78% after sit>supine transfer on 2 L02. Dr Priscella Mann in room and reports patient okay to be increased to 3 L02. with cues for breathing techniques, Sp02 increased to 88% after several minutes.    Pertinent Vitals/ Pain       Pain Assessment: 0-10 Pain Score: 4  Pain Location: right knee, left shoulder, generalized pain in most joints with range of motion Pain Descriptors / Indicators: Sore Pain Intervention(s): Limited activity within patient's tolerance;Monitored during session;Repositioned         Frequency  Min 2X/week        Progress Toward Goals  OT Goals(current goals can now be found in the care plan section)  Progress towards OT goals: Progressing toward goals  Acute Rehab OT Goals Patient Stated Goal: to get stronger OT Goal  Formulation: With patient Time For Goal Achievement: 08/29/21 Potential to Achieve Goals: [employer] Discharge plan remains appropriate;Frequency remains appropriate    Co-evaluation    PT/OT/SLP Co-Evaluation/Treatment: Yes Reason for Co-Treatment: For patient/therapist safety;To address functional/ADL transfers PT goals addressed during session: Mobility/safety with mobility;Balance OT goals addressed during session: ADL's and self-care      AM-PAC OT "6 Clicks" Daily Activity     Outcome Measure   Help from another person eating meals?: A Little Help from another person taking care of personal grooming?: A Lot Help from another person toileting, which includes using toliet, bedpan, or urinal?: Total Help from another person bathing (including washing, rinsing, drying)?: Total Help from another person to put on and taking off regular upper body clothing?: A Lot Help from another person to put on and taking off regular lower body clothing?: Total 6 Click Score: 10    End of Session Equipment Utilized During Treatment: Oxygen  OT Visit Diagnosis: Unsteadiness on feet (R26.81);Repeated falls (R29.6);Muscle weakness (generalized) (M62.81)   Activity Tolerance Patient tolerated treatment well   Patient Left in bed;with call bell/phone within reach;with bed alarm set;with family/visitor present   Nurse Communication Mobility status        Time: 6213-0865 OT Time Calculation (min): 32 min  Charges: OT  General Charges $OT Visit: 1 Visit OT Treatments $Self Care/Home Management : 8-22 mins  Fredirick Maudlin, OTR/L [place]

## 2021-08-23 DIAGNOSIS — J9621 Acute and chronic respiratory failure with hypoxia: Secondary | ICD-10-CM | POA: Diagnosis not present

## 2021-08-23 DIAGNOSIS — J9622 Acute and chronic respiratory failure with hypercapnia: Secondary | ICD-10-CM | POA: Diagnosis not present

## 2021-08-23 LAB — BASIC METABOLIC PANEL
Anion gap: 11 (ref 5–15)
BUN: 6 mg/dL (ref 6–20)
CO2: 37 mmol/L — ABNORMAL HIGH (ref 22–32)
Calcium: 9.2 mg/dL (ref 8.9–10.3)
Chloride: 88 mmol/L — ABNORMAL LOW (ref 98–111)
Creatinine, Ser: 0.51 mg/dL — ABNORMAL LOW (ref 0.61–1.24)
GFR, Estimated: >60 mL/min (ref >60)
Glucose, Bld: 139 mg/dL — ABNORMAL HIGH (ref 70–99)
Potassium: 2.9 mmol/L — ABNORMAL LOW (ref 3.5–5.1)
Sodium: 136 mmol/L (ref 135–145)

## 2021-08-23 LAB — GLUCOSE, CAPILLARY
Glucose-Capillary: 133 mg/dL — ABNORMAL HIGH (ref 70–99)
Glucose-Capillary: 173 mg/dL — ABNORMAL HIGH (ref 70–99)

## 2021-08-23 LAB — MAGNESIUM: Magnesium: 1.6 mg/dL — ABNORMAL LOW (ref 1.7–2.4)

## 2021-08-23 LAB — PHOSPHORUS: Phosphorus: 5.9 mg/dL — ABNORMAL HIGH (ref 2.5–4.6)

## 2021-08-23 LAB — POTASSIUM: Potassium: 3.4 mmol/L — ABNORMAL LOW (ref 3.5–5.1)

## 2021-08-23 MED ORDER — POTASSIUM CHLORIDE CRYS ER 20 MEQ PO TBCR
40.0000 meq | EXTENDED_RELEASE_TABLET | ORAL | Status: AC
  Administered 2021-08-23: 09:00:00 40 meq via ORAL
  Filled 2021-08-23: qty 2

## 2021-08-23 MED ORDER — POTASSIUM CHLORIDE CRYS ER 20 MEQ PO TBCR: 40.0000 meq | EXTENDED_RELEASE_TABLET | ORAL | Status: DC

## 2021-08-23 MED ORDER — POTASSIUM CHLORIDE ER 20 MEQ PO TBCR: 10.0000 meq | EXTENDED_RELEASE_TABLET | Freq: Two times a day (BID) | ORAL | 0 refills | Status: DC

## 2021-08-23 MED ORDER — POTASSIUM CHLORIDE CRYS ER 20 MEQ PO TBCR
40.0000 meq | EXTENDED_RELEASE_TABLET | Freq: Once | ORAL | Status: AC
  Administered 2021-08-23: 40 meq via ORAL
  Filled 2021-08-23: qty 2

## 2021-08-23 MED ORDER — CEFADROXIL 1 G PO TABS: 1.0000 g | ORAL_TABLET | Freq: Two times a day (BID) | ORAL | 0 refills | Status: DC

## 2021-08-23 MED ORDER — CLOTRIMAZOLE 1 % EX CREA: TOPICAL_CREAM | Freq: Two times a day (BID) | CUTANEOUS | 0 refills | Status: DC

## 2021-08-23 MED ORDER — POTASSIUM CHLORIDE 10 MEQ/100ML IV SOLN
10.0000 meq | INTRAVENOUS | Status: AC
  Administered 2021-08-23 (×2): 10 meq via INTRAVENOUS
  Filled 2021-08-23 (×2): qty 100

## 2021-08-23 MED ORDER — DOCUSATE SODIUM 100 MG PO CAPS: 100.0000 mg | ORAL_CAPSULE | Freq: Two times a day (BID) | ORAL | 0 refills | Status: AC | PRN

## 2021-08-23 NOTE — TOC Progression Note (Signed)
Transition of Care [facility]) - Progression Note    Patient Details  Name: Craig [name] MRN: [id number] Date of Birth: [birth year]  Transition of Care [facility]) CM/SW [place], LCSW Phone Number: 08/23/2021, 10:36 AM  Clinical Narrative:   [facility] is scheduled for in-home NIV set up at 3:00. Sent secure chat to MD and RN to notify.  Expected Discharge Plan:  (TBD) Barriers to Discharge: Equipment Delay  Expected Discharge Plan and Services Expected Discharge Plan:  (TBD)         Expected Discharge Date: 08/17/21               DME Arranged: NIV DME Agency: [facility] Date DME Agency Contacted: 08/17/21 Time DME Agency Contacted: [phone] Representative spoke with at DME Agency: [place]: PT, OT [place] Agency: [place] Date [place]: 08/17/21 Time [place]: 1156 Representative spoke with at [place]: [place]   Social Determinants of Health ([place]) Interventions    Readmission Risk Interventions Readmission Risk Prevention Plan 07/10/2021  Transportation Screening Complete  PCP or Specialist Appt within 3-5 Days Complete  HRI or [place] Complete  Social Work Consult for [place] Planning/Counseling [place] Not Applicable  Medication Review Press photographer) Complete  Some recent data might be hidden

## 2021-08-23 NOTE — Progress Notes (Signed)
Patient discharged home in stable condition via EMS.

## 2021-08-23 NOTE — TOC Transition Note (Signed)
Transition of Care [facility]) - CM/SW Discharge Note   Patient Details  Name: Craig [name] MRN: [id number] Date of Birth: Aug 27, 1990  Transition of [facility]) CM/SW Contact:  Candie Chroman, LCSW Phone Number: 08/23/2021, 3:17 PM   Clinical Narrative:  Patient has orders to discharge home today. Per wife, going to [address] in [place]. House number on facesheet is incorrect. EMS transport has been arranged and he is 2nd on the list. [facility] respiratory therapist is going to complete NIV training with patient's mother. [place] representative is aware of plan for discharge today. No further concerns. CSW signing off.  Final next level of care: [place] Barriers to Discharge: Barriers Resolved   Patient Goals and CMS Choice Patient states their goals for this hospitalization and ongoing recovery are:: glad to be going home CMS Medicare.gov Compare Post Acute Care list provided to:: Patient Choice offered to / list presented to : Patient, Spouse  Discharge Placement                Patient to be transferred to facility by: EMS Name of family member notified: Donato Schultz Patient and family notified of of transfer: 08/23/21  Discharge Plan and Services                DME Arranged: NIV DME Agency: [facility] Date DME Agency Contacted: 08/23/21 Time DME Agency Contacted: 1156 Representative spoke with at DME Agency: [place] Arranged: PT, OT [place] Agency: [place] Date [place]: 08/23/21 Time [place]: 1156 Representative spoke with at [place]: [place] ([place]) Interventions     Readmission Risk Interventions Readmission Risk Prevention Plan 07/10/2021  Transportation Screening Complete  PCP or Specialist Appt within 3-5 Days Complete  HRI or [place] Complete  Social Work Consult for [place] Planning/Counseling [place] Screening Not Applicable  Medication Review Press photographer) Complete  Some recent data might be hidden

## 2021-08-23 NOTE — Discharge Summary (Signed)
Physician Discharge Summary  Craig [name]:[id number] DOB: Dec 29, 1990 DOA: 08/09/2021  PCP: [name], MD  Admit date: 08/09/2021 Discharge date: 08/23/2021  Admitted From: home Disposition:  home  Recommendations for Outpatient Follow-up:  1Follow up with PCP in 1-2 weeks, Follow-up with infectious disease, Dr. [name] as scheduled 2Discharging on cefadroxil 1 g p.o. twice daily for 4 weeks per ID recommendations 3Outpatient palliative care the following discharge 4Please obtain BMP/CBC in one week  Home Health:PTOT  Equipment/Devices: NIV ventilator oxygen 2 L [place]   Discharge Condition: Stable Code Status:   Code Status: Full Code Diet recommendation:  Diet Order             Diet - low sodium heart healthy           Diet regular Room service appropriate? 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  On arrival to the ED, patient was being bagged by EMS to maintain SPO2 in the low 90s.  He was noted to have an elevated WBC count of 22, tachypneic, tachycardic and hypoxia with concern for sepsis secondary to pneumonia in the setting of seizure activity.  Patient was started on empiric antibiotics and intubated by EDP for severe hypoxic/hypercarbic respite failure in the setting of obesity, OHS,  severe deconditioning..  Patient was initially admitted to the critical care service.  Patient was extubated on 08/14/2021.  Patient was transferred to hospital service on 12/27. He has remains medically stable for discharge.  Discharge has been delayed by lack of insurance approval and delivery of home [facility] plan has been ready and is being delivered today 08/23/2021 and is being discharged home in medically stable condition  Discharge Diagnoses:   Acute on chronic hypoxic and hypercapnic respiratory failure POA Morbid obesity with obesity hypoventilation Severe deconditioning History of severe COVID-19 pneumonia requiring tracheostomy that was eventually decannulated Sepsis with MSSA pneumonia: Patient had prolonged hospital stay at this time respiratory status stable, 2 L of cannula during the day and BiPAP nightly along with aggressive bronchodilator inhalers to be continued at home.  NIVV has been set up today and is stable for discharge he has completed all his antibiotic regimen.   Sepsis due to MSSA bacteremia/pneumonia and MSSA right knee septic arthritis: He is s/p I&D x2., TEE 07/12/2021 negative.Treated with IV cefazolin as per ID transitioned to cefadroxil--PICC line removed 12/28, completed antibiotic through 09/11/2018.  Repeat blood culture on 12/27 no growth so far  Acute metabolic encephalopathy likely multifactorial in the setting of sepsis Baseline issues with anxiety/impulsivity CT Head negative for acute abnormality, Limited MRI negative for acute abnormality. EEG without seizure activity.  Continue Depakote Klonopin avoid  fentanyl due to paradoxical agitation   Seizure Like Activity:Similar presentation during last admission, Tramadol was previously discontinued due to suspected seizures, it appears that this was restarted as outpatient, now discontinued, CT Head shows no acute intracranial abnormality.  MRI shows no acute abnormality though limited study. EEG showed  no seizure activity. May have been myoclonic activity due to hypoxia. Plan is toDepakene 500 mg q8h, seizure precautions   H/o upper extremity DVT: It appears that he was started on Xarelto before around Feb/Mar 2022 for provoked upper extremity DVT. CTA chest neg for PE.  Continue his Xarelto.    Anasarca: Diuresed here, net negative balance.  Follow-up outpatient.  Continue on Lasix and also potassium supplementation Net IO Since Admission: -26,152.18 mL [08/23/21 1314]   Hypokalemia repleted this morning and improved to 3.4-give additional 40 M EQ K-Dur prior to discharge and will continue potassium supplementation at home check BMP in 1 week-discussed with patient and family.  Diabetes mellitus:Last HgbA1c 6.8 on 07/08/21.  Diet controlled at home.  Sinus tachycardia: Continue Metoprolol succinate 100 mg p.o. daily   Morbid obesity:Body mass index is 39.39 kg/m.  Outpatient follow-up weight loss advised  Weakness/deconditioning/debility:PT/OT recommending home health on discharge   Pressure Ulcer: Pressure Injury 07/08/21 Scrotum Bilateral Stage 1 -  Intact skin with non-blanchable redness of a localized area usually over a bony prominence. red nonblanchable on sacrum, above sacrum and scrotal sack (Active)  07/08/21   Location: Scrotum  Location Orientation: Bilateral  Staging: Stage 1 -  Intact skin with non-blanchable redness of a localized area usually over a bony prominence.  Wound Description (Comments): red nonblanchable on sacrum, above sacrum and scrotal sack  Present on Admission: Yes    Consults: PCCM ID  Subjective: Seen this morning waking up from BiPAP, not in distress   Discharge Exam: Vitals:   08/23/21 0830 08/23/21 1136  BP:  (!) 111/58  Pulse:  (!) 106  Resp:  16  Temp: 97.8 F (36.6 C) 98 F (36.7 C)  SpO2:  97%   General: Pt is alert, awake, not in acute distress Cardiovascular: RRR, S1/S2 +, no rubs, no gallops Respiratory: CTA bilaterally,  no wheezing, no rhonchi Abdominal: Soft, NT, ND, bowel sounds + Extremities: no edema, no cyanosis  Discharge Instructions  Discharge Instructions     Call MD for:  difficulty breathing, headache or visual disturbances   Complete by: As directed    Call MD for:  extreme fatigue   Complete by: As directed    Call MD for:  persistant dizziness or light-headedness   Complete by: As directed    Call MD for:  persistant nausea and vomiting   Complete by: As directed    Call MD for:  severe uncontrolled pain   Complete by: As directed    Call MD for:  temperature >100.4   Complete by: As directed    Diet - low sodium heart healthy   Complete by: As directed    Increase activity slowly   Complete by: As directed    No wound care   Complete by: As directed       Allergies as of 08/23/2021       Reactions   Tramadol Hcl    SEIZURES!!! DO NOT REORDER AT DISCHARGE!!!   Fentanyl Citrate Other (See Comments)   Makes patient more agitated & delirious   Amoxicillin Rash   Tolerated cefepime and cefazolin 08/2020. Tolerated Zosyn 07/2021        Medication List     STOP taking these medications    insulin glargine 100 UNIT/ML Solostar Pen Commonly known as: LANTUS       TAKE these medications    acetaminophen 650 MG CR tablet  Commonly known as: TYLENOL Take 650 mg by mouth every 8 (eight) hours as needed.   acetaZOLAMIDE 250 MG tablet Commonly known as: DIAMOX Take 1 tablet (250 mg total) by mouth 2 (two) times daily.   albuterol 1.25 MG/3ML nebulizer solution Commonly known as: ACCUNEB Take 1 ampule by nebulization 3 (three) times daily as needed.   allopurinol 300 MG tablet Commonly known as: ZYLOPRIM Take 300 mg by mouth daily.   ascorbic acid 500 MG tablet Commonly known as: VITAMIN C Take 1 tablet (500 mg total) by mouth 2 (two) times daily.   blood glucose meter kit and supplies Kit Dispense based on patient and insurance preference. Use up to four times  daily as directed.   budesonide 0.25 MG/2ML nebulizer solution Commonly known as: PULMICORT Take 0.25 mg by nebulization daily.   cefadroxil 1 g tablet Commonly known as: DURICEF Take 1 tablet (1 g total) by mouth 2 (two) times daily for 20 days.   cetirizine 10 MG tablet Commonly known as: ZYRTEC Take 10 mg by mouth daily.   clonazePAM 1 MG tablet Commonly known as: KLONOPIN Take 1 tablet (1 mg total) by mouth 2 (two) times daily. What changed:  how much to take when to take this   clotrimazole 1 % cream Commonly known as: LOTRIMIN Apply topically 2 (two) times daily.   [employer] Saline Nasal Spray 0.65 % nasal spray Generic drug: sodium chloride SMARTSIG:1 Spray(s) Both Nares Every 4 Hours PRN   docusate sodium 100 MG capsule Commonly known as: COLACE Take 1 capsule (100 mg total) by mouth 2 (two) times daily as needed for mild constipation.   DULoxetine 60 MG capsule Commonly known as: CYMBALTA Take 60 mg by mouth daily.   famotidine 20 MG tablet Commonly known as: PEPCID Take 20 mg by mouth 2 (two) times daily.   Fish Oil 1000 MG Caps Take 1 capsule by mouth daily.   folic acid 1 MG tablet Commonly known as: FOLVITE Take 1 mg by mouth daily.   furosemide 40 MG tablet Commonly known as: LASIX Take 1 tablet (40 mg total) by mouth 2 (two) times daily. What changed: when to take this   insulin aspart 100 UNIT/ML injection Commonly known as: novoLOG Inject 10 Units into the skin 3 (three) times daily with meals.   lidocaine 5 % Commonly known as: LIDODERM 1 patch daily as needed.   melatonin 3 MG Tabs tablet Take 6 mg by mouth at bedtime as needed.   metoprolol succinate 100 MG 24 hr tablet Commonly known as: TOPROL-XL Take 100 mg by mouth daily.   multivitamin with minerals tablet Take 1 tablet by mouth daily.   nicotine 21 mg/24hr patch Commonly known as: NICODERM CQ - dosed in mg/24 hours Place 1 patch (21 mg total) onto the skin daily.    nystatin powder Commonly known as: MYCOSTATIN/NYSTOP Apply topically 3 (three) times daily.   ondansetron 4 MG tablet Commonly known as: ZOFRAN Take 4 mg by mouth every 8 (eight) hours as needed.   Oxycodone HCl 10 MG Tabs Take 0.5-1 tablets (5-10 mg total) by mouth every 6 (six) hours as needed for moderate pain or severe pain.   polyethylene glycol 17 g packet Commonly known as: MIRALAX / GLYCOLAX Take 17 g by mouth daily.   Potassium Chloride ER 20 MEQ Tbcr Take 10 mEq by mouth 2 (two) times daily.   pregabalin 200 MG capsule Commonly known as: LYRICA Take 200 mg by mouth 2 (two)  times daily.   rivaroxaban 20 MG Tabs tablet Commonly known as: XARELTO Take 20 mg by mouth daily with supper.   valproic acid 250 MG capsule Commonly known as: Depakene Take 2 capsules (500 mg total) by mouth every 8 (eight) hours.   Vitamin D3 25 MCG (1000 UT) Caps Take 1 capsule by mouth daily.        Follow-up Information     [name], MD. Schedule an appointment as soon as possible for a visit in 1 week(s).   Specialty: Internal Medicine Why: [phone] Contact information: [place] [address] [phone]         Tsosie Billing, MD. Schedule an appointment as soon as possible for a visit in 2 week(s).   Specialty: Infectious Diseases Contact information: [place] [address] [phone]                Allergies  Allergen Reactions   Tramadol Hcl     SEIZURES!!! DO NOT REORDER AT DISCHARGE!!!   Fentanyl Citrate Other (See Comments)    Makes patient more agitated & delirious   Amoxicillin Rash    Tolerated cefepime and cefazolin 08/2020. Tolerated Zosyn 07/2021    The results of significant diagnostics from this hospitalization (including imaging, microbiology, ancillary and laboratory) are listed below for reference.    Microbiology: Recent Results (from the past  240 hour(s))  CULTURE, BLOOD (ROUTINE X 2) w Reflex to ID Panel     Status: None   Collection Time: 08/15/21 12:45 PM   Specimen: BLOOD  Result Value Ref Range Status   Specimen Description BLOOD LEFT HAND  Final   Special Requests   Final    BOTTLES DRAWN AEROBIC AND ANAEROBIC Blood Culture adequate volume   Culture   Final    NO GROWTH 5 DAYS Performed at [facility], [address] [place], [place] [address]    Report Status 08/20/2021 FINAL  Final  CULTURE, BLOOD (ROUTINE X 2) w Reflex to ID Panel     Status: None   Collection Time: 08/15/21 12:46 PM   Specimen: BLOOD  Result Value Ref Range Status   Specimen Description BLOOD RIGHT HAND  Final   Special Requests   Final    BOTTLES DRAWN AEROBIC AND ANAEROBIC Blood Culture adequate volume   Culture   Final    NO GROWTH 5 DAYS Performed at [facility], [address] [place], [place] [address]    Report Status 08/20/2021 FINAL  Final  Resp Panel by RT-PCR (Flu A&B, Covid) Nasopharyngeal Swab     Status: None   Collection Time: 08/16/21  4:18 PM   Specimen: Nasopharyngeal Swab; Nasopharyngeal(NP) swabs in vial transport medium  Result Value Ref Range Status   SARS Coronavirus 2 by RT PCR NEGATIVE NEGATIVE Final    Comment: (NOTE) SARS-CoV-2 target nucleic acids are NOT DETECTED.  The SARS-CoV-2 RNA is generally detectable in upper respiratory specimens during the acute phase of infection. The lowest concentration of SARS-CoV-2 viral copies this assay can detect is 138 copies/mL. A negative result does not preclude SARS-Cov-2 infection and should not be used as the sole basis for treatment or other patient management decisions. A negative result may occur with  improper specimen collection/handling, submission of specimen other than nasopharyngeal swab, presence of viral mutation(s) within the areas targeted by this assay, and inadequate number of viral copies(<138 copies/mL). A negative result must  be  combined with clinical observations, patient history, and epidemiological information. The expected result is Negative.  Fact Sheet for Patients:  EntrepreneurPulse.com.au  Fact Sheet for Healthcare Providers:  IncredibleEmployment.be  This test is no t yet approved or cleared by the [place] FDA and  has been authorized for detection and/or diagnosis of SARS-CoV-2 by FDA under an Emergency Use Authorization (EUA). This EUA will remain  in effect (meaning this test can be used) for the duration of the COVID-19 declaration under Section 564(b)(1) of the Act, 21 U.S.C.section 360bbb-3(b)(1), unless the authorization is terminated  or revoked sooner.       Influenza A by PCR NEGATIVE NEGATIVE Final   Influenza B by PCR NEGATIVE NEGATIVE Final    Comment: (NOTE) The Xpert Xpress SARS-CoV-2/FLU/RSV plus assay is intended as an aid in the diagnosis of influenza from Nasopharyngeal swab specimens and should not be used as a sole basis for treatment. Nasal washings and aspirates are unacceptable for Xpert Xpress SARS-CoV-2/FLU/RSV testing.  Fact Sheet for Patients: EntrepreneurPulse.com.au  Fact Sheet for Healthcare Providers: IncredibleEmployment.be  This test is not yet approved or cleared by the [place] FDA and has been authorized for detection and/or diagnosis of SARS-CoV-2 by FDA under an Emergency Use Authorization (EUA). This EUA will remain in effect (meaning this test can be used) for the duration of the COVID-19 declaration under Section 564(b)(1) of the Act, 21 U.S.C. section 360bbb-3(b)(1), unless the authorization is terminated or revoked.  Performed at [facility], [place], [place] [address]   Respiratory (~20 pathogens) panel by PCR     Status: None   Collection Time: 08/16/21  4:18 PM   Specimen: Nasopharyngeal Swab; Respiratory  Result Value Ref  Range Status   Adenovirus NOT DETECTED NOT DETECTED Final   Coronavirus 229E NOT DETECTED NOT DETECTED Final    Comment: (NOTE) The Coronavirus on the Respiratory Panel, DOES NOT test for the novel  Coronavirus (2019 nCoV)    Coronavirus HKU1 NOT DETECTED NOT DETECTED Final   Coronavirus NL63 NOT DETECTED NOT DETECTED Final   Coronavirus OC43 NOT DETECTED NOT DETECTED Final   Metapneumovirus NOT DETECTED NOT DETECTED Final   Rhinovirus / Enterovirus NOT DETECTED NOT DETECTED Final   Influenza A NOT DETECTED NOT DETECTED Final   Influenza B NOT DETECTED NOT DETECTED Final   Parainfluenza Virus 1 NOT DETECTED NOT DETECTED Final   Parainfluenza Virus 2 NOT DETECTED NOT DETECTED Final   Parainfluenza Virus 3 NOT DETECTED NOT DETECTED Final   Parainfluenza Virus 4 NOT DETECTED NOT DETECTED Final   Respiratory Syncytial Virus NOT DETECTED NOT DETECTED Final   Bordetella pertussis NOT DETECTED NOT DETECTED Final   Bordetella Parapertussis NOT DETECTED NOT DETECTED Final   Chlamydophila pneumoniae NOT DETECTED NOT DETECTED Final   Mycoplasma pneumoniae NOT DETECTED NOT DETECTED Final    Comment: Performed at [facility], [place]. [address] [place], [place] [address]    Procedures/Studies: DG Abd 1 View  Result Date: 08/10/2021 CLINICAL DATA:  OG tube placement EXAM: ABDOMEN - 1 VIEW COMPARISON:  07/08/2021 FINDINGS: OG tube tip is in the distal stomach. Paucity of gas throughout the abdomen. IMPRESSION: OG tube in the distal stomach Electronically Signed   By: Rolm Baptise M.D.   On: 08/10/2021 07:00   CT HEAD WO CONTRAST (5MM)  Result Date: 08/10/2021 CLINICAL DATA:  Status post seizure. EXAM: CT HEAD WITHOUT CONTRAST TECHNIQUE: Contiguous axial images were obtained from the base of the skull  through the vertex without intravenous contrast. COMPARISON:  July 08, 2021 FINDINGS: Brain: No evidence of acute infarction, hemorrhage, hydrocephalus, extra-axial collection or mass  lesion/mass effect. Vascular: No hyperdense vessel or unexpected calcification. Skull: Normal. Negative for fracture or focal lesion. Sinuses/Orbits: There is mild bilateral ethmoid sinus and marked severity sphenoid sinus mucosal thickening Other: None. IMPRESSION: 1. No acute intracranial pathology. 2. Mild bilateral ethmoid sinus and marked severity sphenoid sinus disease. Electronically Signed   By: Virgina Norfolk M.D.   On: 08/10/2021 01:26   CT Angio Chest PE W and/or Wo Contrast  Result Date: 08/10/2021 CLINICAL DATA:  Shortness of breath. EXAM: CT ANGIOGRAPHY CHEST WITH CONTRAST TECHNIQUE: Multidetector CT imaging of the chest was performed using the standard protocol during bolus administration of intravenous contrast. Multiplanar CT image reconstructions and MIPs were obtained to evaluate the vascular anatomy. CONTRAST:  132m OMNIPAQUE IOHEXOL 350 MG/ML SOLN COMPARISON:  July 08, 2021 FINDINGS: Cardiovascular: The subsegmental pulmonary arteries are limited in evaluation secondary to overlying beam hardening artifact. No evidence of pulmonary embolism. Normal heart size. No pericardial effusion. Mediastinum/Nodes: No enlarged mediastinal, hilar, or axillary lymph nodes. Thyroid gland, trachea, and esophagus demonstrate no significant findings. Lungs/Pleura: Endotracheal tube is seen. Stable, moderate to marked severity consolidative atelectatic changes are seen within the bilateral upper lobes and left lower lobe. There is no evidence of a pleural effusion or pneumothorax. Upper Abdomen: There is stable elevation of the right hemidiaphragm. Diffuse fatty infiltration of the liver parenchyma is noted. Musculoskeletal: No chest wall abnormality. No acute or significant osseous findings. Review of the MIP images confirms the above findings. IMPRESSION: 1. No evidence of pulmonary embolism. 2. Stable, moderate to marked severity bilateral upper lobe and left lower lobe consolidative atelectatic  changes. 3. Hepatic steatosis. Electronically Signed   By: TVirgina NorfolkM.D.   On: 08/10/2021 01:29   MR BRAIN WO CONTRAST  Result Date: 08/11/2021 CLINICAL DATA:  Meningitis/CNS infection suspected. Rule out septic emboli. EXAM: MRI HEAD WITHOUT CONTRAST TECHNIQUE: Multiplanar, multiecho pulse sequences of the brain and surrounding structures were obtained without intravenous contrast. COMPARISON:  CT head 08/10/2021 FINDINGS: Brain: Incomplete study. The patient became hypotensive early in the study and the study was terminated by the nurse supervising. Diffusion-weighted imaging is diagnostic. No acute infarct identified. Ventricle size normal. No fluid collection or areas of restricted diffusion. Mucosal edema of the sphenoid sinus. No focal skeletal lesion. IMPRESSION: Incomplete study due to episode of hypotension during the scan Diffusion-weighted imaging demonstrates no acute infarct. Electronically Signed   By: CFranchot GalloM.D.   On: 08/11/2021 14:45   DG Chest Port 1 View  Result Date: 08/15/2021 CLINICAL DATA:  Difficulty breathing, fever EXAM: PORTABLE CHEST 1 VIEW COMPARISON:  Previous studies including the examination of 08/10/2021 FINDINGS: Transverse diameter of heart is increased. Increased interstitial markings are seen in right parahilar region and both lower lung fields. There is slight improvement in aeration of parahilar regions. There is interval removal of endotracheal tube. Distal portion of right PICC line appears to be coiled in the area of right subclavian vein. IMPRESSION: Cardiomegaly. Increased interstitial markings are seen in the parahilar regions and lower lung fields suggesting possible interstitial pneumonitis. No new focal infiltrates are seen. There is no significant pleural effusion or pneumothorax. Distal portion of PICC line introduced through the right upper extremity appears to be coiled in the area of right subclavian vein. Electronically Signed   By:  PElmer PickerM.D.   On: 08/15/2021  13:16   DG Chest Portable 1 View  Result Date: 08/09/2021 CLINICAL DATA:  Shortness of breath check endotracheal tube placement EXAM: PORTABLE CHEST 1 VIEW COMPARISON:  07/21/2021 FINDINGS: Endotracheal tube is noted in satisfactory position above the carina. Cardiac shadow is stable. Vascular congestion is noted. Right-sided PICC line is seen at the cavoatrial junction. No bony abnormality is noted. IMPRESSION: Vascular congestion. Tubes and lines as described. Electronically Signed   By: Inez Catalina M.D.   On: 08/09/2021 23:20   EEG adult  Result Date: 08/11/2021 Derek Jack, MD     08/11/2021 10:44 AM Routine EEG Report ALEXY HELDT is a 31 y.o. male with a history of encephalopathy who is undergoing an EEG to evaluate for seizures. Report: This EEG was acquired with electrodes placed according to the International 10-20 electrode system (including Fp1, Fp2, F3, F4, C3, C4, P3, P4, O1, O2, T3, T4, T5, T6, A1, A2, Fz, Cz, Pz). The following electrodes were missing or displaced: none. The best background was 8.5 Hz. Later in the recording there are some intervening periods of relative suppression lasting 1-2 seconds. This activity is reactive to stimulation. There was no sleep architecture identified. There is right frontal focal slowing. There are lateralized periodic discharges in the right frontal region q 4 seconds that abate towards the end of the recording. Photic stimulation and hyperventilation were not performed. Impression and clinical correlation: This EEG was obtained while sedated and is abnormal due to: - Lateralized periodic discharges in the right frontal region indicating increased epileptogenic potential in that region - Right focal slowing indicating focal cerebral dysfunction in that region - Brief periods of relative suppression likely indicating medication effect There were no electrographic seizures seen in this recording.  [name], MD [facility] [phone] If 7pm- 7am, please page neurology on call as listed in [place].    Labs: BNP (last 3 results) Recent Labs    07/08/21 1448 08/09/21 2247 08/11/21 0609  BNP 28.7 36.9 41.9   Basic Metabolic Panel: Recent Labs  Lab 08/17/21 0546 08/20/21 0848 08/22/21 0405 08/23/21 0513 08/23/21 1220  NA 137 137 138 136  --   K 5.0 3.5 3.2* 2.9* 3.4*  CL 92* 85* 90* 88*  --   CO2 35* 43* 41* 37*  --   GLUCOSE 135* 131* 105* 139*  --   BUN <5* <5* <5* 6  --   CREATININE 0.42* 0.40* 0.42* 0.51*  --   CALCIUM 9.7 10.0 9.1 9.2  --   MG 1.5* 1.5*  --  1.6*  --   PHOS  --   --   --  5.9*  --    Liver Function Tests: No results for input(s): AST, ALT, ALKPHOS, BILITOT, PROT, ALBUMIN in the last 168 hours. No results for input(s): LIPASE, AMYLASE in the last 168 hours. No results for input(s): AMMONIA in the last 168 hours. CBC: Recent Labs  Lab 08/17/21 0546 08/20/21 0848  WBC 9.0 6.0  NEUTROABS  --  3.2  HGB 8.7* 9.2*  HCT 30.0* 32.6*  MCV 94.3 95.3  PLT 279 265   Cardiac Enzymes: No results for input(s): CKTOTAL, CKMB, CKMBINDEX, TROPONINI in the last 168 hours. BNP: Invalid input(s): POCBNP CBG: Recent Labs  Lab 08/22/21 1210 08/22/21 1628 08/22/21 2047 08/23/21 0735 08/23/21 1138  GLUCAP 209* 231* 124* 133* 173*   D-Dimer No results for input(s): DDIMER in the last 72 hours. Hgb A1c No results for input(s): HGBA1C in the last  72 hours. Lipid Profile No results for input(s): CHOL, HDL, LDLCALC, TRIG, CHOLHDL, LDLDIRECT in the last 72 hours. Thyroid function studies No results for input(s): TSH, T4TOTAL, T3FREE, THYROIDAB in the last 72 hours.  Invalid input(s): FREET3 Anemia work up No results for input(s): VITAMINB12, FOLATE, FERRITIN, TIBC, IRON, RETICCTPCT in the last 72 hours. Urinalysis    Component Value Date/Time   COLORURINE YELLOW 08/09/2021 2247   APPEARANCEUR CLEAR 08/09/2021 2247   LABSPEC >1.030  (H) 08/09/2021 2247   PHURINE 5.5 08/09/2021 2247   GLUCOSEU NEGATIVE 08/09/2021 2247   HGBUR LARGE (A) 08/09/2021 2247   [place] NEGATIVE 08/09/2021 2247   KETONESUR NEGATIVE 08/09/2021 2247   PROTEINUR >300 (A) 08/09/2021 2247   NITRITE NEGATIVE 08/09/2021 2247   LEUKOCYTESUR NEGATIVE 08/09/2021 2247   Sepsis Labs Invalid input(s): PROCALCITONIN,  WBC,  LACTICIDVEN Microbiology Recent Results (from the past 240 hour(s))  CULTURE, BLOOD (ROUTINE X 2) w Reflex to ID Panel     Status: None   Collection Time: 08/15/21 12:45 PM   Specimen: BLOOD  Result Value Ref Range Status   Specimen Description BLOOD LEFT HAND  Final   Special Requests   Final    BOTTLES DRAWN AEROBIC AND ANAEROBIC Blood Culture adequate volume   Culture   Final    NO GROWTH 5 DAYS Performed at [facility], [address] [place], [place] [address]    Report Status 08/20/2021 FINAL  Final  CULTURE, BLOOD (ROUTINE X 2) w Reflex to ID Panel     Status: None   Collection Time: 08/15/21 12:46 PM   Specimen: BLOOD  Result Value Ref Range Status   Specimen Description BLOOD RIGHT HAND  Final   Special Requests   Final    BOTTLES DRAWN AEROBIC AND ANAEROBIC Blood Culture adequate volume   Culture   Final    NO GROWTH 5 DAYS Performed at [facility], [address] [place], [place] [address]    Report Status 08/20/2021 FINAL  Final  Resp Panel by RT-PCR (Flu A&B, Covid) Nasopharyngeal Swab     Status: None   Collection Time: 08/16/21  4:18 PM   Specimen: Nasopharyngeal Swab; Nasopharyngeal(NP) swabs in vial transport medium  Result Value Ref Range Status   SARS Coronavirus 2 by RT PCR NEGATIVE NEGATIVE Final    Comment: (NOTE) SARS-CoV-2 target nucleic acids are NOT DETECTED.  The SARS-CoV-2 RNA is generally detectable in upper respiratory specimens during the acute phase of infection. The lowest concentration of SARS-CoV-2 viral copies this assay can detect is 138 copies/mL. A  negative result does not preclude SARS-Cov-2 infection and should not be used as the sole basis for treatment or other patient management decisions. A negative result may occur with  improper specimen collection/handling, submission of specimen other than nasopharyngeal swab, presence of viral mutation(s) within the areas targeted by this assay, and inadequate number of viral copies(<138 copies/mL). A negative result must be combined with clinical observations, patient history, and epidemiological information. The expected result is Negative.  Fact Sheet for Patients:  EntrepreneurPulse.com.au  Fact Sheet for Healthcare Providers:  IncredibleEmployment.be  This test is no t yet approved or cleared by the [place] FDA and  has been authorized for detection and/or diagnosis of SARS-CoV-2 by FDA under an Emergency Use Authorization (EUA). This EUA will remain  in effect (meaning this test can be used) for the duration of the COVID-19 declaration under Section 564(b)(1) of the Act, 21 U.S.C.section 360bbb-3(b)(1), unless the authorization  is terminated  or revoked sooner.       Influenza A by PCR NEGATIVE NEGATIVE Final   Influenza B by PCR NEGATIVE NEGATIVE Final    Comment: (NOTE) The Xpert Xpress SARS-CoV-2/FLU/RSV plus assay is intended as an aid in the diagnosis of influenza from Nasopharyngeal swab specimens and should not be used as a sole basis for treatment. Nasal washings and aspirates are unacceptable for Xpert Xpress SARS-CoV-2/FLU/RSV testing.  Fact Sheet for Patients: EntrepreneurPulse.com.au  Fact Sheet for Healthcare Providers: IncredibleEmployment.be  This test is not yet approved or cleared by the [place] FDA and has been authorized for detection and/or diagnosis of SARS-CoV-2 by FDA under an Emergency Use Authorization (EUA). This EUA will remain in effect (meaning this test can  be used) for the duration of the COVID-19 declaration under Section 564(b)(1) of the Act, 21 U.S.C. section 360bbb-3(b)(1), unless the authorization is terminated or revoked.  Performed at [facility], [place], [place] [address]   Respiratory (~20 pathogens) panel by PCR     Status: None   Collection Time: 08/16/21  4:18 PM   Specimen: Nasopharyngeal Swab; Respiratory  Result Value Ref Range Status   Adenovirus NOT DETECTED NOT DETECTED Final   Coronavirus 229E NOT DETECTED NOT DETECTED Final    Comment: (NOTE) The Coronavirus on the Respiratory Panel, DOES NOT test for the novel  Coronavirus (2019 nCoV)    Coronavirus HKU1 NOT DETECTED NOT DETECTED Final   Coronavirus NL63 NOT DETECTED NOT DETECTED Final   Coronavirus OC43 NOT DETECTED NOT DETECTED Final   Metapneumovirus NOT DETECTED NOT DETECTED Final   Rhinovirus / Enterovirus NOT DETECTED NOT DETECTED Final   Influenza A NOT DETECTED NOT DETECTED Final   Influenza B NOT DETECTED NOT DETECTED Final   Parainfluenza Virus 1 NOT DETECTED NOT DETECTED Final   Parainfluenza Virus 2 NOT DETECTED NOT DETECTED Final   Parainfluenza Virus 3 NOT DETECTED NOT DETECTED Final   Parainfluenza Virus 4 NOT DETECTED NOT DETECTED Final   Respiratory Syncytial Virus NOT DETECTED NOT DETECTED Final   Bordetella pertussis NOT DETECTED NOT DETECTED Final   Bordetella Parapertussis NOT DETECTED NOT DETECTED Final   Chlamydophila pneumoniae NOT DETECTED NOT DETECTED Final   Mycoplasma pneumoniae NOT DETECTED NOT DETECTED Final    Comment: Performed at [facility], [place]. [address], [place], [place] [address]     Time coordinating discharge: 35 minutes  SIGNED: Antonieta Pert, MD  [facility] 08/23/2021, 1:14 PM  If 7PM-7AM, please contact night-coverage [email or web address]

## 2021-08-25 LAB — ACID FAST CULTURE WITH REFLEXED SENSITIVITIES (MYCOBACTERIA)
Acid Fast Culture: NEGATIVE
Acid Fast Culture: NEGATIVE

## 2021-08-27 ENCOUNTER — Other Ambulatory Visit: Payer: Self-pay | Admitting: Student in an Organized Health Care Education/Training Program

## 2021-08-28 ENCOUNTER — Encounter: Payer: Self-pay | Admitting: Infectious Diseases

## 2021-08-28 ENCOUNTER — Emergency Department: Payer: Medicaid Other

## 2021-08-28 ENCOUNTER — Encounter: Payer: Self-pay | Admitting: Pharmacy Technician

## 2021-08-28 ENCOUNTER — Inpatient Hospital Stay
Admission: EM | Admit: 2021-08-28 | Discharge: 2021-09-03 | DRG: 987 | Disposition: A | Discharge status: Home Health | Payer: Medicaid Other | Attending: Internal Medicine | Admitting: Internal Medicine

## 2021-08-28 ENCOUNTER — Observation Stay: Payer: Medicaid Other

## 2021-08-28 DIAGNOSIS — I10 Essential (primary) hypertension: Secondary | ICD-10-CM | POA: Diagnosis not present

## 2021-08-28 DIAGNOSIS — Z794 Long term (current) use of insulin: Secondary | ICD-10-CM

## 2021-08-28 DIAGNOSIS — Z79899 Other long term (current) drug therapy: Secondary | ICD-10-CM

## 2021-08-28 DIAGNOSIS — Z9981 Dependence on supplemental oxygen: Secondary | ICD-10-CM

## 2021-08-28 DIAGNOSIS — U099 Post covid-19 condition, unspecified: Secondary | ICD-10-CM | POA: Diagnosis present

## 2021-08-28 DIAGNOSIS — M109 Gout, unspecified: Secondary | ICD-10-CM | POA: Diagnosis present

## 2021-08-28 DIAGNOSIS — J9621 Acute and chronic respiratory failure with hypoxia: Secondary | ICD-10-CM | POA: Diagnosis present

## 2021-08-28 DIAGNOSIS — Z20822 Contact with and (suspected) exposure to covid-19: Secondary | ICD-10-CM | POA: Diagnosis present

## 2021-08-28 DIAGNOSIS — J9622 Acute and chronic respiratory failure with hypercapnia: Principal | ICD-10-CM | POA: Diagnosis present

## 2021-08-28 DIAGNOSIS — G9341 Metabolic encephalopathy: Secondary | ICD-10-CM | POA: Diagnosis present

## 2021-08-28 DIAGNOSIS — M25561 Pain in right knee: Secondary | ICD-10-CM

## 2021-08-28 DIAGNOSIS — Z7401 Bed confinement status: Secondary | ICD-10-CM

## 2021-08-28 DIAGNOSIS — M064 Inflammatory polyarthropathy: Secondary | ICD-10-CM | POA: Diagnosis present

## 2021-08-28 DIAGNOSIS — E119 Type 2 diabetes mellitus without complications: Secondary | ICD-10-CM | POA: Diagnosis not present

## 2021-08-28 DIAGNOSIS — A419 Sepsis, unspecified organism: Secondary | ICD-10-CM

## 2021-08-28 DIAGNOSIS — M25512 Pain in left shoulder: Secondary | ICD-10-CM | POA: Diagnosis present

## 2021-08-28 DIAGNOSIS — B9561 Methicillin susceptible Staphylococcus aureus infection as the cause of diseases classified elsewhere: Secondary | ICD-10-CM | POA: Diagnosis present

## 2021-08-28 DIAGNOSIS — E662 Morbid (severe) obesity with alveolar hypoventilation: Secondary | ICD-10-CM | POA: Diagnosis present

## 2021-08-28 DIAGNOSIS — Z7901 Long term (current) use of anticoagulants: Secondary | ICD-10-CM

## 2021-08-28 DIAGNOSIS — R52 Pain, unspecified: Secondary | ICD-10-CM

## 2021-08-28 DIAGNOSIS — M1711 Unilateral primary osteoarthritis, right knee: Secondary | ICD-10-CM | POA: Diagnosis present

## 2021-08-28 DIAGNOSIS — E878 Other disorders of electrolyte and fluid balance, not elsewhere classified: Secondary | ICD-10-CM | POA: Diagnosis present

## 2021-08-28 DIAGNOSIS — M00061 Staphylococcal arthritis, right knee: Secondary | ICD-10-CM | POA: Diagnosis present

## 2021-08-28 DIAGNOSIS — G6281 Critical illness polyneuropathy: Secondary | ICD-10-CM | POA: Diagnosis present

## 2021-08-28 DIAGNOSIS — M25511 Pain in right shoulder: Secondary | ICD-10-CM | POA: Diagnosis present

## 2021-08-28 DIAGNOSIS — Z86718 Personal history of other venous thrombosis and embolism: Secondary | ICD-10-CM

## 2021-08-28 DIAGNOSIS — G8929 Other chronic pain: Secondary | ICD-10-CM

## 2021-08-28 DIAGNOSIS — Z9049 Acquired absence of other specified parts of digestive tract: Secondary | ICD-10-CM

## 2021-08-28 DIAGNOSIS — Z7951 Long term (current) use of inhaled steroids: Secondary | ICD-10-CM

## 2021-08-28 DIAGNOSIS — Z880 Allergy status to penicillin: Secondary | ICD-10-CM

## 2021-08-28 DIAGNOSIS — Z8701 Personal history of pneumonia (recurrent): Secondary | ICD-10-CM

## 2021-08-28 DIAGNOSIS — R4182 Altered mental status, unspecified: Secondary | ICD-10-CM | POA: Diagnosis not present

## 2021-08-28 DIAGNOSIS — Z7952 Long term (current) use of systemic steroids: Secondary | ICD-10-CM

## 2021-08-28 DIAGNOSIS — E876 Hypokalemia: Secondary | ICD-10-CM

## 2021-08-28 DIAGNOSIS — Z885 Allergy status to narcotic agent status: Secondary | ICD-10-CM

## 2021-08-28 DIAGNOSIS — Z6839 Body mass index (BMI) 39.0-39.9, adult: Secondary | ICD-10-CM

## 2021-08-28 LAB — BLOOD GAS, VENOUS
Acid-Base Excess: 14.6 mmol/L — ABNORMAL HIGH (ref 0.0–2.0)
Bicarbonate: 43.6 mmol/L — ABNORMAL HIGH (ref 20.0–28.0)
O2 Saturation: 57.9 %
Patient temperature: 37
pCO2, Ven: 79 mmHg (ref 44.0–60.0)
pH, Ven: 7.35 (ref 7.250–7.430)
pO2, Ven: 32 mmHg (ref 32.0–45.0)

## 2021-08-28 LAB — CBC WITH DIFFERENTIAL/PLATELET
Abs Immature Granulocytes: 0.06 10*3/uL (ref 0.00–0.07)
Basophils Absolute: 0 10*3/uL (ref 0.0–0.1)
Basophils Relative: 0 %
Eosinophils Absolute: 0.2 10*3/uL (ref 0.0–0.5)
Eosinophils Relative: 2 %
HCT: 38 % — ABNORMAL LOW (ref 39.0–52.0)
Hemoglobin: 11.3 g/dL — ABNORMAL LOW (ref 13.0–17.0)
Immature Granulocytes: 1 %
Lymphocytes Relative: 25 %
Lymphs Abs: 2.2 10*3/uL (ref 0.7–4.0)
MCH: 27.4 pg (ref 26.0–34.0)
MCHC: 29.7 g/dL — ABNORMAL LOW (ref 30.0–36.0)
MCV: 92 fL (ref 80.0–100.0)
Monocytes Absolute: 1.4 10*3/uL — ABNORMAL HIGH (ref 0.1–1.0)
Monocytes Relative: 15 %
Neutro Abs: 5.2 10*3/uL (ref 1.7–7.7)
Neutrophils Relative %: 57 %
Platelets: 313 10*3/uL (ref 150–400)
RBC: 4.13 MIL/uL — ABNORMAL LOW (ref 4.22–5.81)
RDW: 16.8 % — ABNORMAL HIGH (ref 11.5–15.5)
WBC: 9.1 10*3/uL (ref 4.0–10.5)
nRBC: 0 % (ref 0.0–0.2)

## 2021-08-28 LAB — URINALYSIS, ROUTINE W REFLEX MICROSCOPIC
Bilirubin Urine: NEGATIVE
Glucose, UA: NEGATIVE mg/dL
Ketones, ur: NEGATIVE mg/dL
Leukocytes,Ua: NEGATIVE
Nitrite: NEGATIVE
Protein, ur: NEGATIVE mg/dL
Specific Gravity, Urine: 1.004 — ABNORMAL LOW (ref 1.005–1.030)
Squamous Epithelial / HPF: NONE SEEN (ref 0–5)
pH: 7 (ref 5.0–8.0)

## 2021-08-28 LAB — COMPREHENSIVE METABOLIC PANEL
ALT: 12 U/L (ref 0–44)
AST: 31 U/L (ref 15–41)
Albumin: 3.1 g/dL — ABNORMAL LOW (ref 3.5–5.0)
Alkaline Phosphatase: 71 U/L (ref 38–126)
Anion gap: 13 (ref 5–15)
BUN: <5 mg/dL — ABNORMAL LOW (ref 6–20)
CO2: 34 mmol/L — ABNORMAL HIGH (ref 22–32)
Calcium: 9.6 mg/dL (ref 8.9–10.3)
Chloride: 90 mmol/L — ABNORMAL LOW (ref 98–111)
Creatinine, Ser: 0.49 mg/dL — ABNORMAL LOW (ref 0.61–1.24)
GFR, Estimated: >60 mL/min (ref >60)
Glucose, Bld: 166 mg/dL — ABNORMAL HIGH (ref 70–99)
Potassium: 2.6 mmol/L — CL (ref 3.5–5.1)
Sodium: 137 mmol/L (ref 135–145)
Total Bilirubin: 0.7 mg/dL (ref 0.3–1.2)
Total Protein: 7.6 g/dL (ref 6.5–8.1)

## 2021-08-28 LAB — RESP PANEL BY RT-PCR (FLU A&B, COVID) ARPGX2
Influenza A by PCR: NEGATIVE
Influenza B by PCR: NEGATIVE
SARS Coronavirus 2 by RT PCR: NEGATIVE

## 2021-08-28 LAB — LACTIC ACID, PLASMA
Lactic Acid, Venous: 2.4 mmol/L (ref 0.5–1.9)
Lactic Acid, Venous: 2.5 mmol/L (ref 0.5–1.9)

## 2021-08-28 LAB — PROTIME-INR
INR: 2.1 — ABNORMAL HIGH (ref 0.8–1.2)
Prothrombin Time: 23.8 seconds — ABNORMAL HIGH (ref 11.4–15.2)

## 2021-08-28 LAB — ACID FAST CULTURE WITH REFLEXED SENSITIVITIES (MYCOBACTERIA): Acid Fast Culture: NEGATIVE

## 2021-08-28 LAB — BRAIN NATRIURETIC PEPTIDE: B Natriuretic Peptide: 17.2 pg/mL (ref 0.0–100.0)

## 2021-08-28 LAB — MAGNESIUM: Magnesium: 1.6 mg/dL — ABNORMAL LOW (ref 1.7–2.4)

## 2021-08-28 IMAGING — DX DG KNEE 3 VIEWS*R*
3 series · 3 of 3 positions shown · non-contrast
Comparison: Knee x-ray report dated [DATE]. No images were
available for comparison

CLINICAL DATA: Fever, right knee pain and swelling

EXAM:
RIGHT KNEE - 3 VIEW

[knee ap]
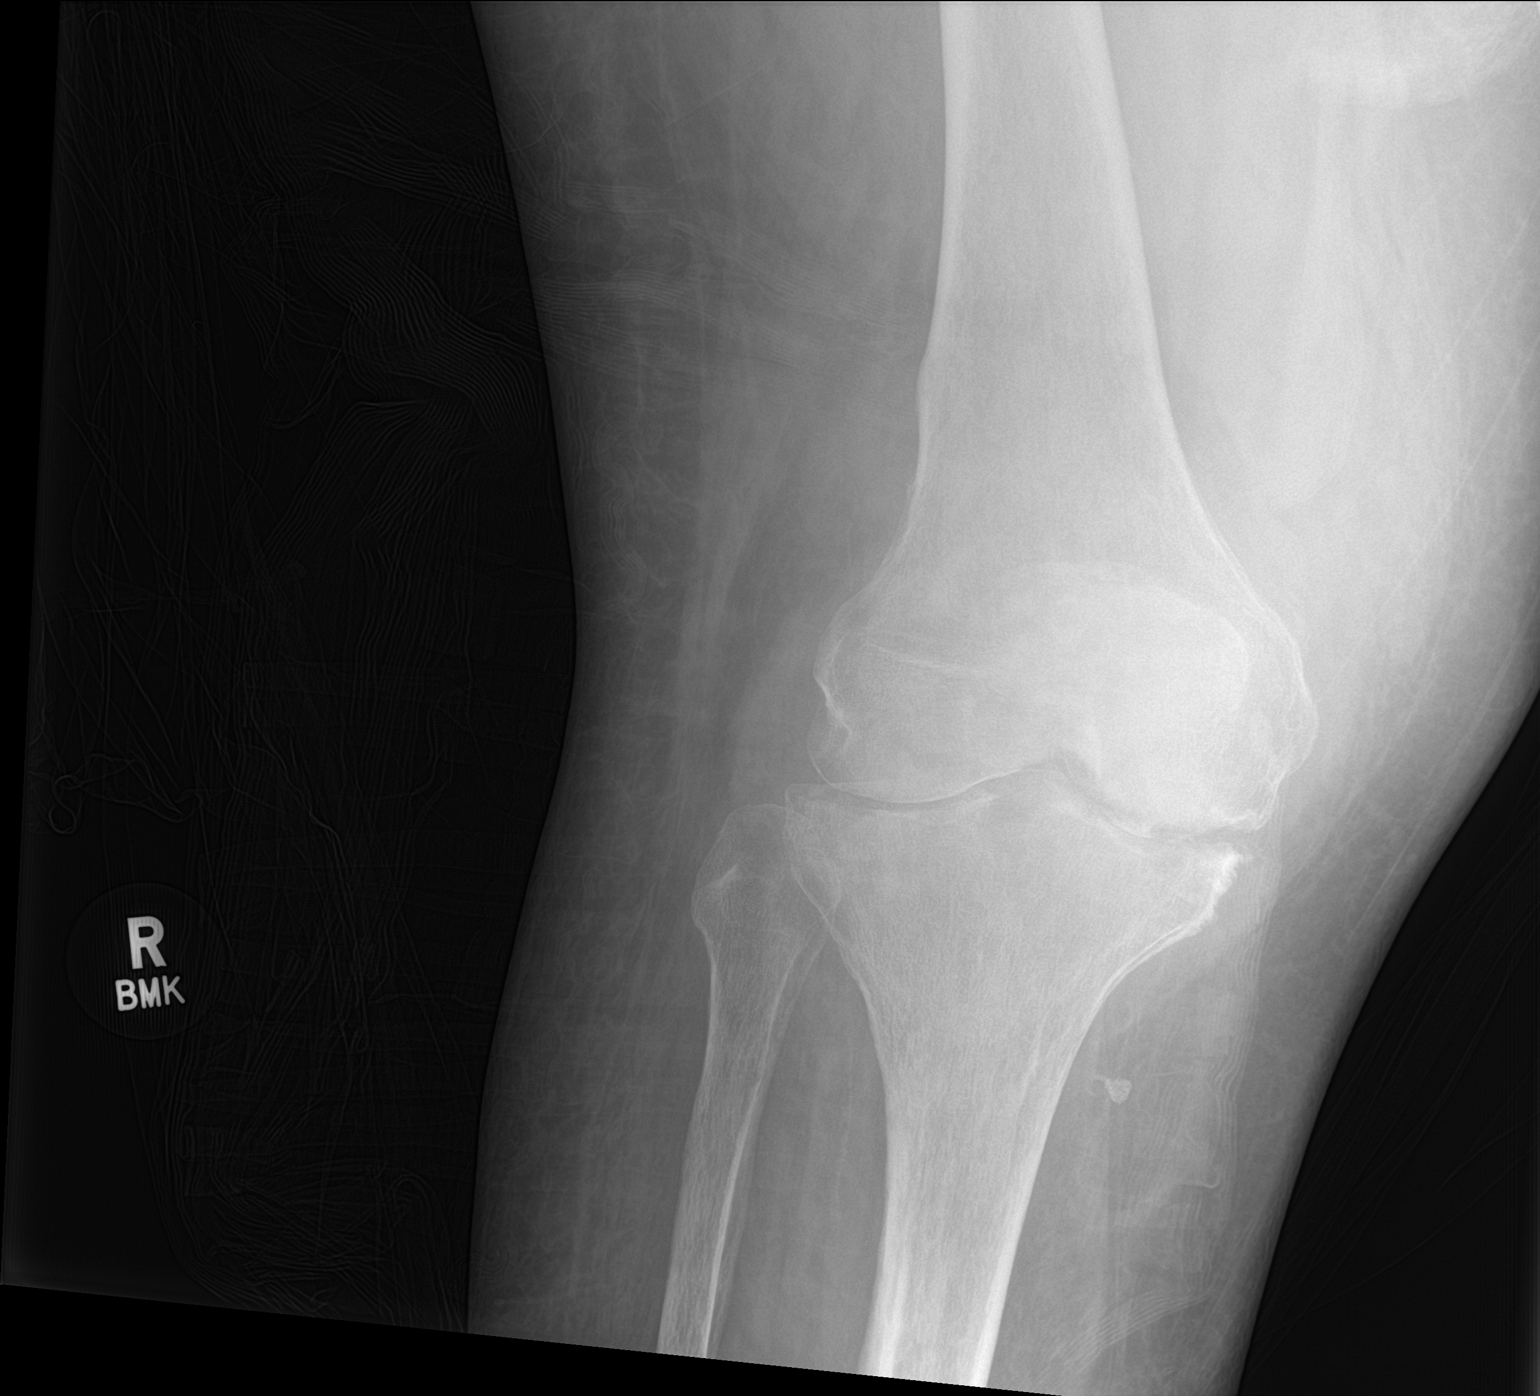

[knee obl]
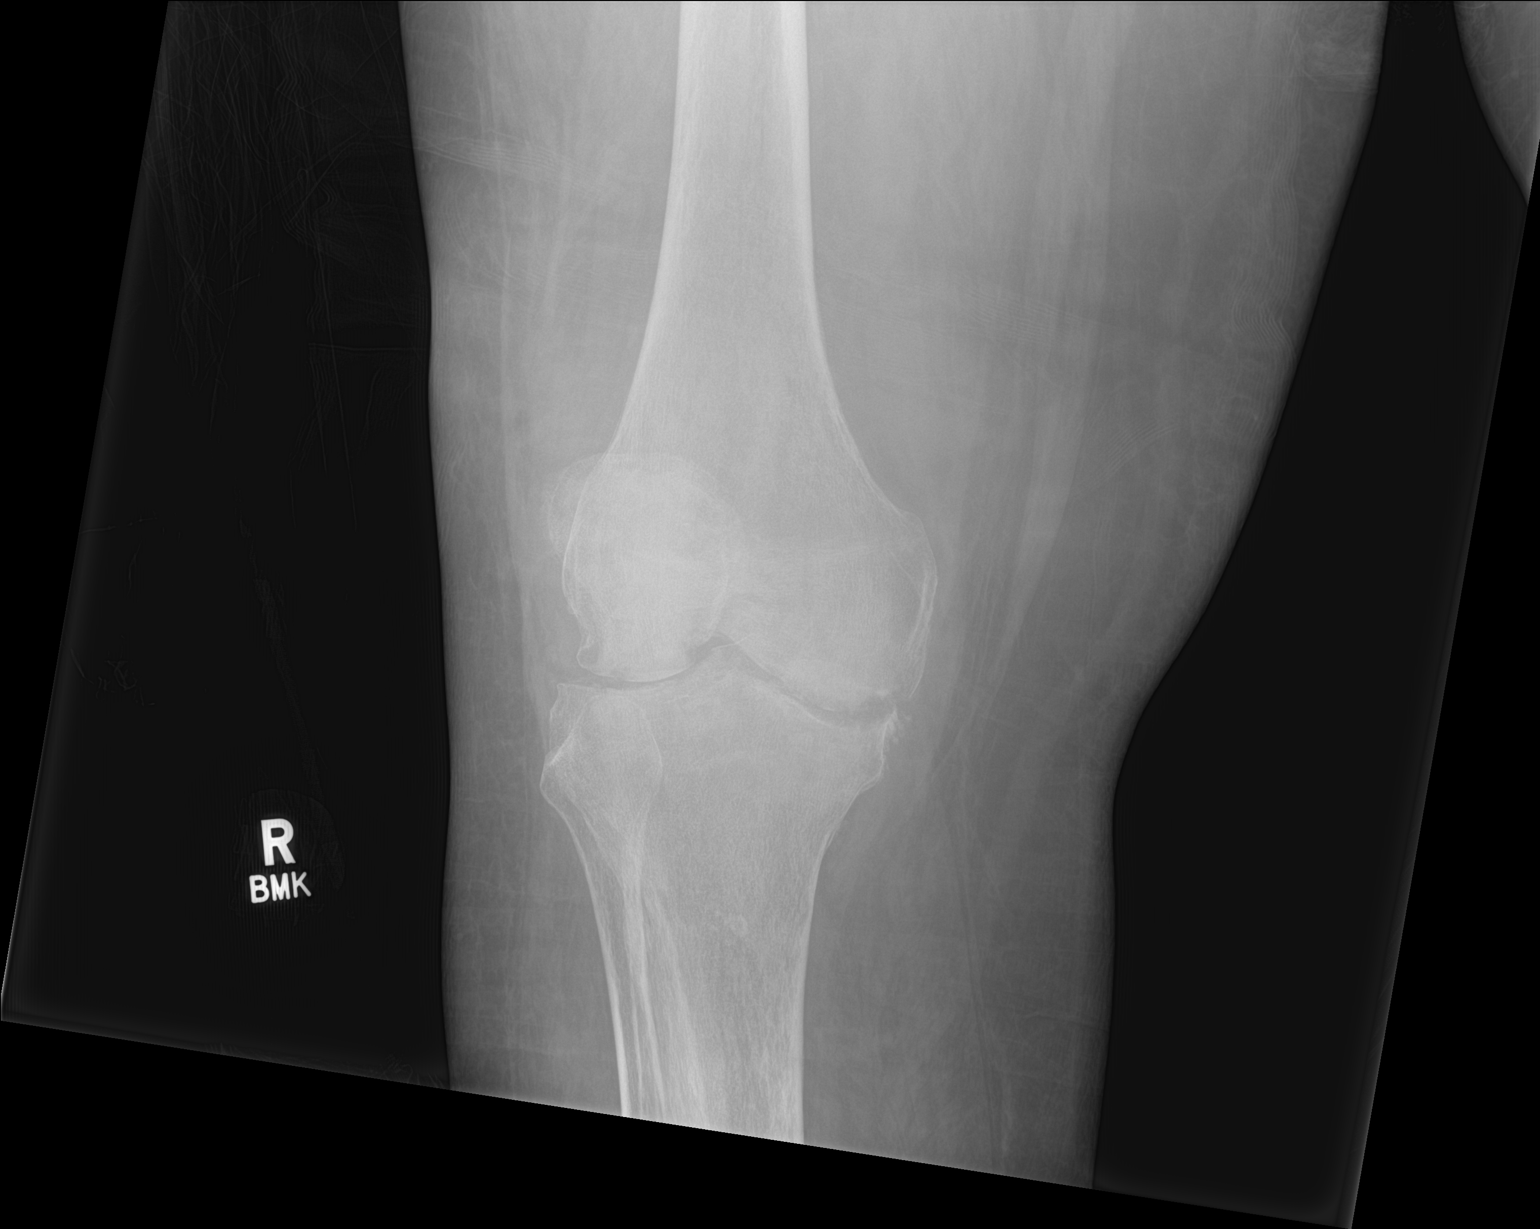

[knee lat]
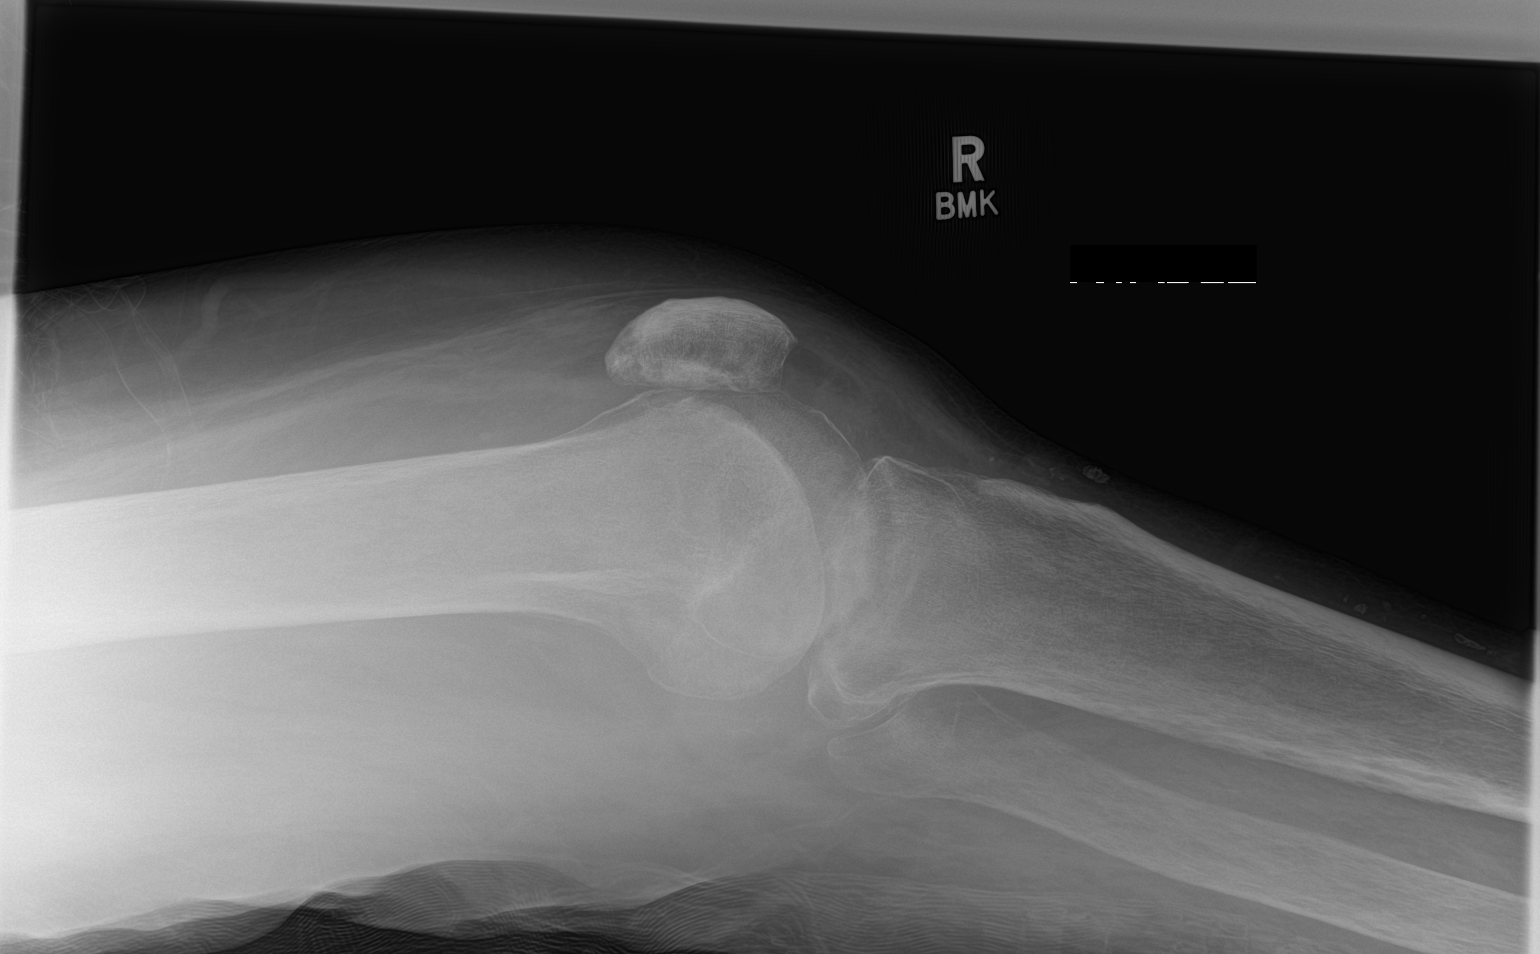

[3 of 3 positions shown; findings below may reference images not displayed]

FINDINGS: Advanced tibiofemoral joint space loss with erosive changes along
the medial femoral condyle and medial tibial plateau. More subtle
erosive changes involving the central aspect of the lateral femoral
condyle. Small joint effusion. Bones appear demineralized.
Generalized soft tissue swelling.
IMPRESSION: Advanced erosive changes of the right knee. Findings highly
suggestive of septic arthritis. Similar findings were reported on
previous radiographs dated [DATE], although the images were not
immediately available for comparison.

## 2021-08-28 IMAGING — DX DG CHEST 1V
1 series · 1 of 1 positions shown · non-contrast
Comparison: Chest x-ray [DATE], CT chest [DATE], chest
x-ray [DATE]

CLINICAL DATA: Suspected Sepsis

EXAM:
CHEST  1 VIEW

[chest ap]
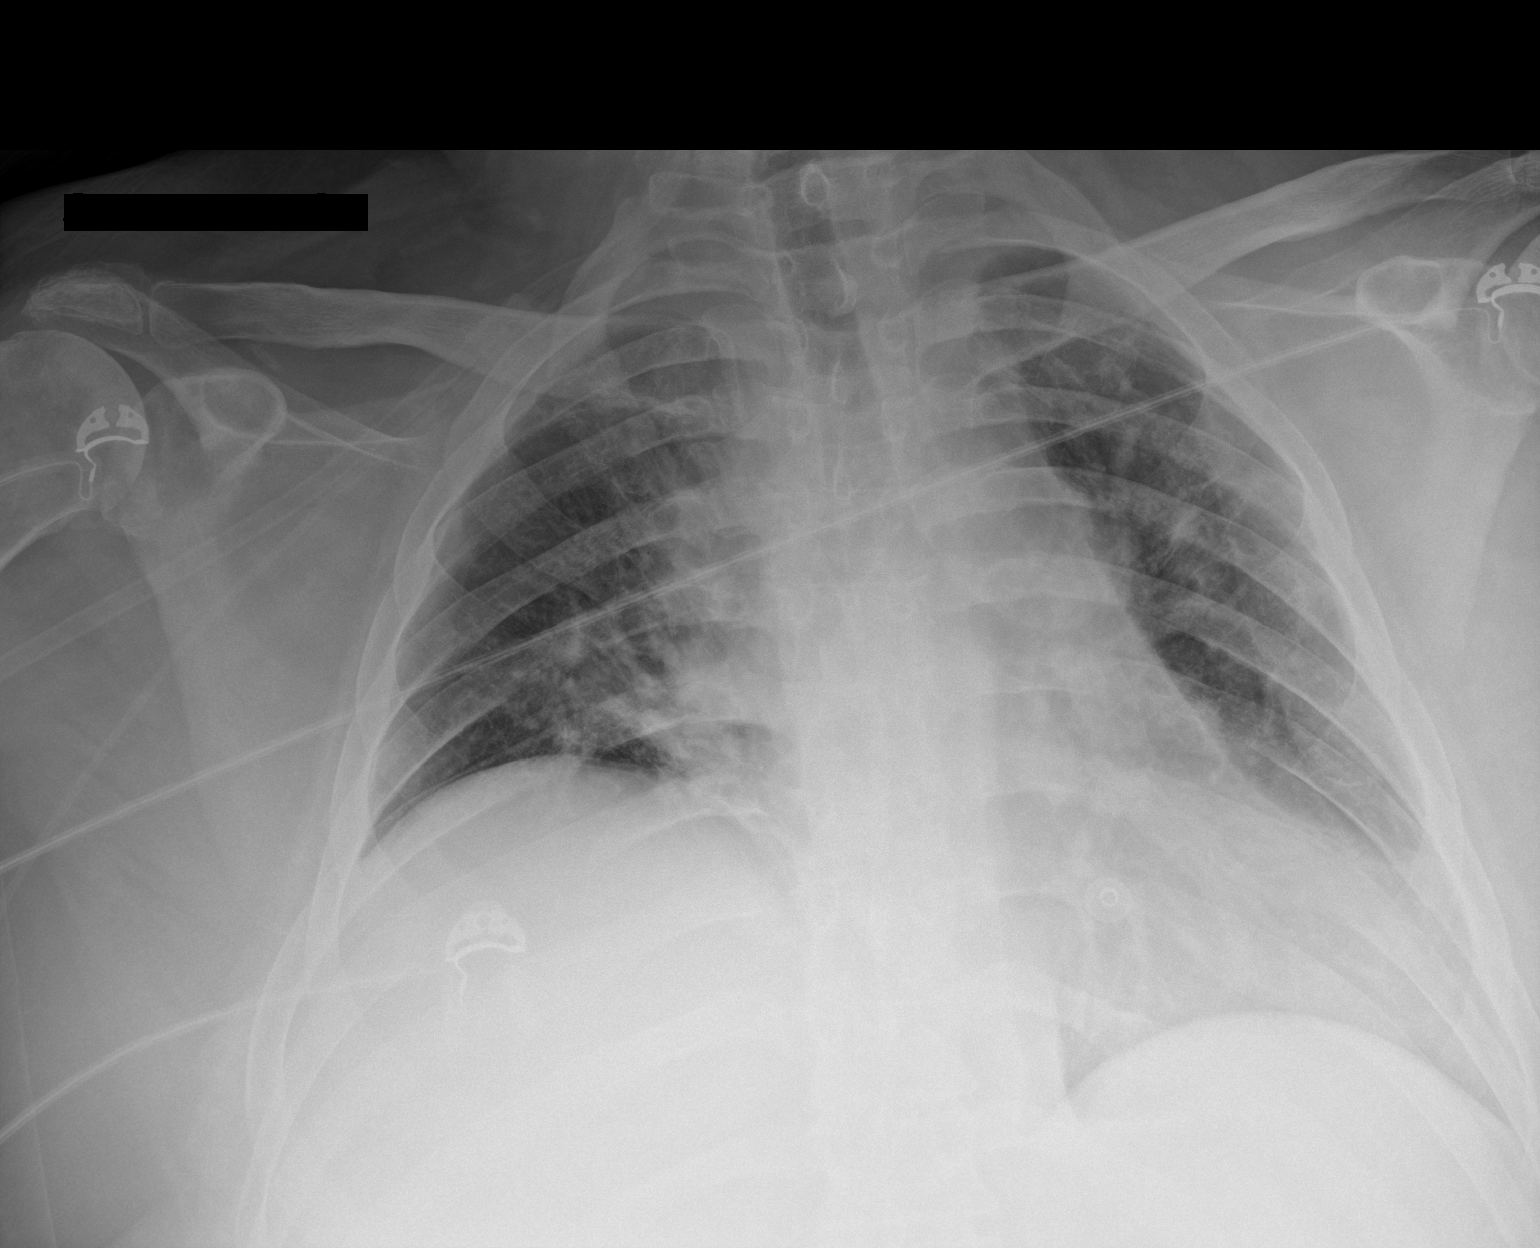

[1 of 1 positions shown; findings below may reference images not displayed]

FINDINGS: The heart and mediastinal contours are unchanged.

Persistent elevation of the right hemidiaphragm. No focal
consolidation. Similar-appearing increased interstitial markings. No
pleural effusion. No pneumothorax.

No acute osseous abnormality.
IMPRESSION: Similar-appearing increased interstitial markings.

## 2021-08-28 MED ORDER — NICOTINE 21 MG/24HR TD PT24
21.0000 mg | MEDICATED_PATCH | Freq: Every day | TRANSDERMAL | Status: DC
  Filled 2021-08-28 (×4): qty 1

## 2021-08-28 MED ORDER — RIVAROXABAN 20 MG PO TABS
20.0000 mg | ORAL_TABLET | Freq: Every day | ORAL | Status: DC
  Administered 2021-08-29 – 2021-09-02 (×5): 20 mg via ORAL
  Filled 2021-08-28 (×6): qty 1

## 2021-08-28 MED ORDER — SODIUM CHLORIDE 0.9 % IV SOLN: 250.0000 mL | INTRAVENOUS | Status: DC | PRN

## 2021-08-28 MED ORDER — INSULIN ASPART 100 UNIT/ML IJ SOLN
0.0000 [IU] | Freq: Every day | INTRAMUSCULAR | Status: DC
  Filled 2021-08-28 (×2): qty 1

## 2021-08-28 MED ORDER — INSULIN ASPART 100 UNIT/ML IJ SOLN
0.0000 [IU] | Freq: Three times a day (TID) | INTRAMUSCULAR | Status: DC
  Administered 2021-08-29 – 2021-08-31 (×5): 3 [IU] via SUBCUTANEOUS
  Administered 2021-08-31: 09:00:00 2 [IU] via SUBCUTANEOUS
  Administered 2021-08-31 – 2021-09-01 (×2): 3 [IU] via SUBCUTANEOUS
  Administered 2021-09-02: 8 [IU] via SUBCUTANEOUS
  Administered 2021-09-02 (×2): 5 [IU] via SUBCUTANEOUS
  Filled 2021-08-28 (×12): qty 1

## 2021-08-28 MED ORDER — BUDESONIDE 0.25 MG/2ML IN SUSP
0.2500 mg | Freq: Every day | RESPIRATORY_TRACT | Status: DC
  Administered 2021-08-29 – 2021-09-03 (×6): 0.25 mg via RESPIRATORY_TRACT
  Filled 2021-08-28 (×6): qty 2

## 2021-08-28 MED ORDER — SODIUM CHLORIDE 0.9% FLUSH
3.0000 mL | Freq: Two times a day (BID) | INTRAVENOUS | Status: DC
  Administered 2021-08-28 – 2021-09-03 (×10): 3 mL via INTRAVENOUS

## 2021-08-28 MED ORDER — POTASSIUM CHLORIDE 10 MEQ/100ML IV SOLN
10.0000 meq | INTRAVENOUS | Status: AC
  Administered 2021-08-28 (×4): 10 meq via INTRAVENOUS
  Filled 2021-08-28: qty 100

## 2021-08-28 MED ORDER — ALBUTEROL SULFATE (2.5 MG/3ML) 0.083% IN NEBU
1.5000 mL | INHALATION_SOLUTION | Freq: Three times a day (TID) | RESPIRATORY_TRACT | Status: DC | PRN
  Administered 2021-09-02: 1.5 mL via RESPIRATORY_TRACT
  Filled 2021-08-28: qty 3

## 2021-08-28 MED ORDER — MAGNESIUM SULFATE 2 GM/50ML IV SOLN
2.0000 g | Freq: Once | INTRAVENOUS | Status: AC
  Administered 2021-08-28: 2 g via INTRAVENOUS
  Filled 2021-08-28: qty 50

## 2021-08-28 MED ORDER — POTASSIUM CHLORIDE CRYS ER 20 MEQ PO TBCR
40.0000 meq | EXTENDED_RELEASE_TABLET | Freq: Once | ORAL | Status: AC
  Administered 2021-08-28: 40 meq via ORAL
  Filled 2021-08-28: qty 2

## 2021-08-28 MED ORDER — VALPROIC ACID 250 MG PO CAPS
500.0000 mg | ORAL_CAPSULE | Freq: Three times a day (TID) | ORAL | Status: DC
  Administered 2021-08-28 – 2021-08-30 (×2): 500 mg via ORAL
  Filled 2021-08-28 (×8): qty 2

## 2021-08-28 MED ORDER — SODIUM CHLORIDE 0.9 % IV BOLUS
250.0000 mL | Freq: Once | INTRAVENOUS | Status: AC
  Administered 2021-08-28: 250 mL via INTRAVENOUS

## 2021-08-28 MED ORDER — SODIUM CHLORIDE 0.9% FLUSH: 3.0000 mL | INTRAVENOUS | Status: DC | PRN

## 2021-08-28 NOTE — ED Triage Notes (Signed)
Pt bib ems for "unresponsive". Recently admitted for resp dist and staph infection. Started on abx before discharg Family called our because he was not responding however, on ems arrival pt alert and oriented.  2L chronic oxygen, CPAP at night.  92% 2L 95% 4L  ST 120 RR 30 CO2 60 160/88 98.7 CBG 168

## 2021-08-28 NOTE — ED Provider Notes (Signed)
[facility] Provider Note    Event Date/Time   First MD Initiated Contact with Patient 08/28/21 1803     (approximate)   History     HPI  Craig [name] is a 31 y.o. 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  According to wife patient was with his mother and was on the trilogy and had fallen asleep and she was concerned that the patient was unresponsive so called EMS.  According the wife she reports that he has episodes of sleep apnea although has not been formally diagnosed he will fall asleep on the trilogy and they will have to do a sternal rub and he will wake up and be okay.  She feels like he is at his baseline self right now.  Patient does have some notable redness to his right leg but wife states that he sometimes gets some redness to his lower legs have that they can then go away.  They report being compliant with the oral antibiotics.  Denies any fevers.  Patient reports some chronic pain in his shoulders and his knees.  They have told the ID doctor about this and its been thought to be secondary to his arthritis.         Physical Exam   Triage Vital Signs: ED Triage Vitals [08/28/21 1755]  Enc Vitals Group     BP 135/76     Pulse Rate 99     Resp 20     Temp 98.5 F (36.9 C)     Temp src      SpO2 94 %     Weight      Height      Head Circumference      Peak Flow      Pain Score      Pain Loc      Pain Edu?      Excl. in [place]?     Most recent vital signs: Vitals:    08/28/21 1755  BP: 135/76  Pulse: 99  Resp: 20  Temp: 98.5 F (36.9 C)  SpO2: 94%     General: Awake, no distress.  Elevated BMI, chronically ill-appearing CV:  Good peripheral perfusion.  Resp:  Some increased work of breathing, clear lung Abd:  No distention.  Prior scars noted with colostomy bag Extremities:  swelling of the feet with edema noted in his bilateral feet with some redness more on the right foot midway up the shin.   ED Results / Procedures / Treatments   Labs (all labs ordered are listed, but only abnormal results are displayed) Labs Reviewed  COMPREHENSIVE METABOLIC PANEL - Abnormal; Notable for the following components:      Result Value   Potassium 2.6 (*)    Chloride 90 (*)    CO2 34 (*)    Glucose, Bld 166 (*)    BUN <5 (*)    Creatinine, Ser 0.49 (*)    Albumin 3.1 (*)    All other components within normal limits  LACTIC ACID, PLASMA - Abnormal; Notable for the following components:   Lactic Acid, Venous 2.4 (*)  All other components within normal limits  LACTIC ACID, PLASMA - Abnormal; Notable for the following components:   Lactic Acid, Venous 2.5 (*)    All other components within normal limits  CBC WITH DIFFERENTIAL/PLATELET - Abnormal; Notable for the following components:   RBC 4.13 (*)    Hemoglobin 11.3 (*)    HCT 38.0 (*)    MCHC 29.7 (*)    RDW 16.8 (*)    Monocytes Absolute 1.4 (*)    All other components within normal limits  PROTIME-INR - Abnormal; Notable for the following components:   Prothrombin Time 23.8 (*)    INR 2.1 (*)    All other components within normal limits  URINALYSIS, ROUTINE W REFLEX MICROSCOPIC - Abnormal; Notable for the following components:   Color, Urine YELLOW (*)    APPearance CLEAR (*)    Specific Gravity, Urine 1.004 (*)    Hgb urine dipstick MODERATE (*)    Bacteria, UA RARE (*)    All other components within normal limits  BLOOD GAS, VENOUS - Abnormal; Notable for the following components:    pCO2, Ven 79 (*)    Bicarbonate 43.6 (*)    Acid-Base Excess 14.6 (*)    All other components within normal limits  MAGNESIUM - Abnormal; Notable for the following components:   Magnesium 1.6 (*)    All other components within normal limits  RESP PANEL BY RT-PCR (FLU A&B, COVID) [id number]  CULTURE, BLOOD (ROUTINE X 2)  CULTURE, BLOOD (ROUTINE X 2)  URINE CULTURE  BRAIN NATRIURETIC PEPTIDE  PROCALCITONIN  PROCALCITONIN  CORTISOL  COMPREHENSIVE METABOLIC PANEL  CBC     EKG  My interpretation of EKG: Sinus tachycardia rate of 104, no ST elevation, no T wave inversion, QTC prolonged at 552    RADIOLOGY I have reviewed the xray personally and agree with radiology read bony erosion of the knee. Xray of the chest similar    PROCEDURES:  Critical Care performed: No  .1-3 Lead EKG Interpretation Performed by: Vanessa [place], MD Authorized by: Vanessa [place], MD     Interpretation: abnormal     ECG rate:  100s   ECG rate assessment: normal     Rhythm: sinus tachycardia     Ectopy: none     Conduction: normal     MEDICATIONS ORDERED IN ED: Medications  potassium chloride 10 mEq in 100 mL IVPB (10 mEq Intravenous New Bag/Given 08/28/21 2256)  budesonide (PULMICORT) nebulizer solution 0.25 mg (has no administration in time range)  albuterol (PROVENTIL) (2.5 MG/3ML) 0.083% nebulizer solution 1.5 mL (has no administration in time range)  valproic acid (DEPAKENE) 250 MG capsule 500 mg (500 mg Oral Given 08/28/21 2233)  rivaroxaban (XARELTO) tablet 20 mg (has no administration in time range)  nicotine (NICODERM CQ - dosed in mg/24 hours) patch 21 mg (has no administration in time range)  insulin aspart (novoLOG) injection 0-15 Units (has no administration in time range)  insulin aspart (novoLOG) injection 0-5 Units (0 Units Subcutaneous Not Given 08/28/21 2234)  sodium chloride flush (NS) 0.9 % injection 3 mL (3 mLs Intravenous Given 08/28/21 2234)  sodium chloride flush (NS) 0.9 %  injection 3 mL (has no administration in time range)  0.9 %  sodium chloride infusion (has no administration in time range)  potassium chloride SA (KLOR-CON M) CR tablet 40 mEq (40 mEq Oral Given 08/28/21 1953)  sodium chloride 0.9 % bolus 250 mL (250 mLs Intravenous New Bag/Given 08/28/21 2006)  magnesium  sulfate IVPB 2 g 50 mL (0 g Intravenous Stopped 08/28/21 2242)     IMPRESSION / MDM / ASSESSMENT AND PLAN / ED COURSE  I reviewed the triage vital signs and the nursing notes.                              Patient with extensive past medical history.  I reviewed his hospital admission note from 12/22-1/4.  Patient was admitted for respiratory distress thought to be secondary to pneumonia.  On review of discharge summary from patient's admission from 12/21 2022 until 08/23/18/2023 patient was admitted for sepsis due to MSSA bacteremia.  There was concern for seizures but patient had negative MRI negative EEG and thought to be secondary to hypoxia.  Patient is on Depakote and Klonopin and recommended to avoid fentanyl due to paradoxical agitation.  Patient is on Xarelto for upper extremity DVT but had negative CTA.  Patient was discharged on cefadroxil 1 g p.o. twice daily for 4 weeks.  The patient is on the cardiac monitor to evaluate for evidence of arrhythmia and/or significant heart rate changes.  Will get chest x-ray to evaluate for any pneumonia, aspiration, x-ray of his knee given patient does report some achiness in his joints and had prior septic knee just to see if there is any evidence of large effusion.  We will get labs to evaluate for recurrent bacterial infection with procalcitonin, white count.  We will get CO2 to make sure no evidence of significant hypercapnia.  We will get labs evaluate for any electrolyte abnormalities, AKI.  Unlikely to be pulmonary embolism given patient's already on treatment.  Abdomen soft nontender low suspicion for acute abdominal process.  X-ray concerning for  erosions consistent with septic arthritis.  Discussed with orthopedic Dr. Posey Pronto who states that this could be just changes from his prior septic knee.  Patient has no white count, no fever.  Does have pain with trying to range it but it sound like this is been like this for a while.  Difficult to tell given his limited exam.  We discussed repeat knee aspiration but patient reports that this is how he got the septic arthritis the first time after a steroid injection.  He would like to try to hold off if possible.  I think it be best to admit patient to the hospital to have orthopedics evaluate him tomorrow.  Have also discussed the case with ID Dr. Tia Masker who this time does not feel like this is a sign of a lapse of the antibiotics.  We will wait for blood cultures but hold off on escalating.  The redness on the legs is probably more just some venous stasis than true cellulitis.  I think we can watch him and make sure that his respiratory status is not changing.  He is got some chronic retention noted on his VBG and uses BiPAP at nighttime.  I did discuss possible team for admission for his electrolyte abnormalities and further observation    FINAL CLINICAL IMPRESSION(S) / ED DIAGNOSES   Final diagnoses:  Hypokalemia  Hypomagnesemia  Chronic pain of right knee     Rx / DC Orders   ED Discharge Orders     None        Note:  This document was prepared using Dragon voice recognition software and may include unintentional dictation errors.   Vanessa , MD 08/28/21 [phone]

## 2021-08-28 NOTE — ED Notes (Signed)
PA aware unable to obtain 2nd blood culture.

## 2021-08-28 NOTE — H&P (Addendum)
History and Physical    Craig [name] DGL:[id number] DOB: 1991-03-23 DOA: 08/28/2021  PCP: [name], MD    Patient coming from:  Home    Chief Complaint:  AMS.    HPI:  Craig [name] is a 31 y.o. male seen in ed with complaints of AMS. Pt has been in and out of hospital for last 3 months. Pt has MSSA of knee and lung. Pt was recently d/c after workup for AMS. Baseline 20-30 times a min and is on oxygen at 2 L chronically.  Initially patient's vitals was tachypneic dyspneic with PCO2 of 68 blood pressure 160/88 and afebrile. Pt has electrolyte abnormalities pt also c/o rt knee is hurting and orthopedic is called Dr.Menz who will see patient.pt was back to baseline since coming to ED. Covid is negative.Cortisol level collected along with cultures.  Per report EMS was called as patient was unresponsive. Pt sees Dr. Lanney Gins. Pt goes into deep sleep and could not wake him up and mom did sternal rub and Craig [name] woke up and Craig [name] hear her call EMS. Pt says Craig [name] has been running low grade fever for about a week, pt did have low grade fever in last admission  as well. Pt is currently on iv cefazolin to po cefadroxil 500 mg (2 pills bid )the patient has MSSA in his right knee.  Pt is not a diabetic unless Craig [name] is on prednisone.  No trouble with urination.  Pt has sacral ulcer in the past and it has resolved.pt has not been able to walk since July of 2022., pt started having knee issue since then. Pt has trilogy ventilator.   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  ED Course:   Vitals:   08/28/21 2300 08/28/21 2330 08/29/21 0000 08/29/21 0030  BP: 135/65 (!) 117/44  118/72 126/73  Pulse: (!) 112 (!) 113 (!) 115 (!) 114  Resp: (!) 28 (!) 31 (!) 28 (!) 35  Temp:      SpO2: 98% 95% 96% 93%   In the emergency room patient is criteria with respiratory rate heart rate. Afebrile oxygenating 100% blood pressure high normal. Labs shows hypokalemia, hypomagnesemia, hypercapnia. Elevated lactic acid level at 2.4, VBG shows pco2 of 79, po2 of 30 BNP of 17.2 Cbc shows wbc count 9.1, hemoglobin of 11.3, platelets 313. PT 23.8/ INR 2.1/ Glucose of 166. Urinalysis show some cells but more than 50 wbc.  Last Echo in 07/12/21: 1. No valve vegetation noted Left ventricular ejection fraction, by estimation, is 60 to 65%. The left ventricle has normal function. The left ventricle has no regional wall motion abnormalities. 2. 3. Right ventricular systolic function is normal. The right ventricular size is normal. 4. No left atrial/left atrial appendage thrombus was detected. The mitral valve is normal in structure. No evidence of mitral valve regurgitation. No evidence of mitral stenosis. 5. The aortic valve is normal in structure. Aortic valve regurgitation is not visualized. No aortic stenosis is present. 6. The inferior vena cava is normal in size with greater than 50% respiratory variability, suggesting right atrial pressure of 3 mmHg. 7. Agitated saline contrast bubble study was negative, with no evidence of any interatrial shunt. 8. Conclusion(s)/Recommendation(s): Normal biventricular function without evidence of  hemodynamically significant valvular heart disease.  Review of Systems:  Review of Systems  Constitutional:  Positive for fever.       Pt has no new complaints.    Musculoskeletal:  Positive for joint pain.       Left shoulder right knee.   All other systems reviewed and are negative.   Past Medical History:  Diagnosis Date   Gout    Hypertension    Rupture of bowel [facility])     Past Surgical History:  Procedure Laterality Date    COLONOSCOPY WITH PROPOFOL N/A 05/18/2020   Procedure: COLONOSCOPY WITH PROPOFOL;  Surgeon: Benjamine Sprague, DO;  Location: [facility];  Service: General;  Laterality: N/A;   COLOSTOMY N/A 11/22/2019   Procedure: COLOSTOMY;  Surgeon: Herbert Pun, MD;  Location: [facility];  Service: General;  Laterality: N/A;   KNEE ARTHROSCOPY Right 07/11/2021   Procedure: SYNOVIAL BIOPSY AND ARTHROSCOPIC IRRGATION AND DEBRIDEMENT OF SEPTIC KNEE;  Surgeon: Hessie Knows, MD;  Location: [facility];  Service: Orthopedics;  Laterality: Right;   KNEE ARTHROSCOPY Right 07/18/2021   Procedure: ARTHROSCOPY KNEE, IRRIGATION AND DEBRIDEMENT;  Surgeon: Hessie Knows, MD;  Location: [facility];  Service: Orthopedics;  Laterality: Right;   LAPAROTOMY N/A 11/22/2019   Procedure: EXPLORATORY LAPAROTOMY;  Surgeon: Herbert Pun, MD;  Location: [facility];  Service: General;  Laterality: N/A;   LAPAROTOMY N/A 07/08/2020   Procedure: EXPLORATORY LAPAROTOMY;  Surgeon: Herbert Pun, MD;  Location: [facility];  Service: General;  Laterality: N/A;   PARTIAL COLECTOMY N/A 11/22/2019   Procedure: PARTIAL COLECTOMY;  Surgeon: Herbert Pun, MD;  Location: [facility];  Service: General;  Laterality: N/A;   PEG PLACEMENT N/A 09/14/2020   Procedure: PERCUTANEOUS ENDOSCOPIC GASTROSTOMY (PEG) PLACEMENT;  Surgeon: Benjamine Sprague, DO;  Location: [facility];  Service: General;  Laterality: N/A;  TRAVEL CASE [place] Right 07/11/2021   Procedure: SYNOVIAL BIOPSY;  Surgeon: Hessie Knows, MD;  Location: [facility];  Service: Orthopedics;  Laterality: Right;   TRACHEOSTOMY TUBE PLACEMENT N/A 09/13/2020   Procedure: TRACHEOSTOMY;  Surgeon: Beverly Gust, MD;  Location: [facility];  Service: ENT;  Laterality: N/A;   XI ROBOTIC ASSISTED COLOSTOMY TAKEDOWN N/A 06/28/2020   Procedure: XI ROBOTIC ASSISTED COLOSTOMY TAKEDOWN CONVERTED TO OPEN PROCEDURE;  Surgeon: Herbert Pun, MD;  Location: [facility];  Service: General;  Laterality: N/A;     reports that Craig [name] has never smoked. His smokeless tobacco use includes chew. Craig [name] reports current alcohol use. Craig [name] reports that Craig [name] does not use drugs.  Allergies  Allergen Reactions   Tramadol Hcl     SEIZURES!!! DO NOT REORDER AT DISCHARGE!!!   Fentanyl Citrate Other (See Comments)    Makes patient more agitated & delirious   Amoxicillin Rash    Tolerated cefepime and cefazolin 08/2020. Tolerated Zosyn 07/2021    Family History  Problem Relation Age of Onset   Healthy Mother    Healthy Father     Prior to Admission medications   Medication Sig Start Date End Date Taking? Authorizing Provider  acetaminophen (TYLENOL) 650 MG CR tablet Take 650 mg by mouth every 8 (eight) hours as needed. 10/14/20   [provider]  acetaZOLAMIDE (DIAMOX) 250 MG tablet Take 1 tablet (250 mg total) by mouth 2 (two) times daily. 07/25/21   Swayze, Ava, DO  albuterol (ACCUNEB) 1.25 MG/3ML nebulizer solution Take 1 ampule by nebulization 3 (three) times daily as needed. 03/07/21   [provider]  allopurinol (ZYLOPRIM)  300 MG tablet Take 300 mg by mouth daily. 02/09/21   [provider]  ascorbic acid (VITAMIN C) 500 MG tablet Take 1 tablet (500 mg total) by mouth 2 (two) times daily. 07/25/21   Swayze, Ava, DO  blood glucose meter kit and supplies KIT Dispense based on patient and insurance preference. Use up to four times daily as directed. 07/26/21   Richarda Osmond, MD  budesonide (PULMICORT) 0.25 MG/2ML nebulizer solution Take 0.25 mg by nebulization daily. 03/07/21 03/07/22  [provider]  cefadroxil (DURICEF) 1 g tablet Take 1 tablet (1 g total) by mouth 2 (two) times daily for 20 days. 08/23/21 09/12/21  Antonieta Pert, MD  cetirizine (ZYRTEC) 10 MG tablet Take 10 mg by mouth daily.    [provider]  Cholecalciferol (VITAMIN D3) 25 MCG (1000 UT) CAPS Take 1 capsule by mouth daily.    [provider]  clonazePAM  (KLONOPIN) 1 MG tablet Take 1 tablet (1 mg total) by mouth 2 (two) times daily. 08/17/21 11/15/21  [place] Donnamarie Poag, DO  clotrimazole (LOTRIMIN) 1 % cream Apply topically 2 (two) times daily. 08/23/21   Antonieta Pert, MD  [employer] SALINE NASAL SPRAY 0.65 % nasal spray SMARTSIG:1 Spray(s) Both Nares Every 4 Hours PRN 10/14/20   [provider]  docusate sodium (COLACE) 100 MG capsule Take 1 capsule (100 mg total) by mouth 2 (two) times daily as needed for mild constipation. 08/23/21   Antonieta Pert, MD  DULoxetine (CYMBALTA) 60 MG capsule Take 60 mg by mouth daily. 11/14/20 11/14/21  [provider]  famotidine (PEPCID) 20 MG tablet Take 20 mg by mouth 2 (two) times daily. 10/14/20   [provider]  folic acid (FOLVITE) 1 MG tablet Take 1 mg by mouth daily. 03/01/21 03/01/22  [provider]  furosemide (LASIX) 40 MG tablet Take 1 tablet (40 mg total) by mouth 2 (two) times daily. Patient taking differently: Take 40 mg by mouth daily. 07/25/21   Swayze, Ava, DO  insulin aspart (NOVOLOG) 100 UNIT/ML injection Inject 10 Units into the skin 3 (three) times daily with meals. Patient not taking: Reported on 08/08/2021 07/25/21   Swayze, Ava, DO  lidocaine (LIDODERM) 5 % 1 patch daily as needed. 07/07/21   [provider]  melatonin 3 MG TABS tablet Take 6 mg by mouth at bedtime as needed. 10/14/20   [provider]  metoprolol succinate (TOPROL-XL) 100 MG 24 hr tablet Take 100 mg by mouth daily. 01/05/21 01/05/22  [provider]  Multiple Vitamins-Minerals (MULTIVITAMIN WITH MINERALS) tablet Take 1 tablet by mouth daily.    [provider]  nicotine (NICODERM CQ - DOSED IN MG/24 HOURS) 21 mg/24hr patch Place 1 patch (21 mg total) onto the skin daily. 07/26/21   Swayze, Ava, DO  nystatin (MYCOSTATIN/NYSTOP) powder Apply topically 3 (three) times daily. 07/25/21   Swayze, Ava, DO  Omega-3 Fatty Acids (FISH OIL) 1000 MG CAPS Take 1 capsule by mouth daily.     [provider]  ondansetron (ZOFRAN) 4 MG tablet Take 4 mg by mouth every 8 (eight) hours as needed. 10/14/20   [provider]  oxyCODONE 10 MG TABS Take 0.5-1 tablets (5-10 mg total) by mouth every 6 (six) hours as needed for moderate pain or severe pain. 07/25/21   Swayze, Ava, DO  polyethylene glycol (MIRALAX / GLYCOLAX) 17 g packet Take 17 g by mouth daily. 07/25/21   Swayze, Ava, DO  Potassium Chloride ER 20 MEQ TBCR  Take 10 mEq by mouth 2 (two) times daily. 08/23/21   Antonieta Pert, MD  pregabalin (LYRICA) 200 MG capsule Take 200 mg by mouth 2 (two) times daily. 03/01/21 03/01/22  [provider]  rivaroxaban (XARELTO) 20 MG TABS tablet Take 20 mg by mouth daily with supper. 11/07/20   [provider]  valproic acid (DEPAKENE) 250 MG capsule Take 2 capsules (500 mg total) by mouth every 8 (eight) hours. 08/17/21 11/15/21  [place] Eric J, DO    Physical Exam: Vitals:   08/28/21 2300 08/28/21 2330 08/29/21 0000 08/29/21 0030  BP: 135/65 (!) 117/44 118/72 126/73  Pulse: (!) 112 (!) 113 (!) 115 (!) 114  Resp: (!) 28 (!) 31 (!) 28 (!) 35  Temp:      SpO2: 98% 95% 96% 93%   Physical Exam Vitals reviewed.  Constitutional:      General: Craig [name] is not in acute distress.    Appearance: Craig [name] is obese. Craig [name] is not ill-appearing, toxic-appearing or diaphoretic.  HENT:     Head: Normocephalic and atraumatic.     Right Ear: External ear normal.     Left Ear: External ear normal.     Nose: Nose normal.     Mouth/Throat:     Mouth: Mucous membranes are moist.  Eyes:     Extraocular Movements: Extraocular movements intact.     Pupils: Pupils are equal, round, and reactive to light.  Cardiovascular:     Rate and Rhythm: Normal rate and regular rhythm.     Pulses: Normal pulses.     Heart sounds: Normal heart sounds.  Pulmonary:     Effort: Pulmonary effort is normal.     Breath sounds: Normal breath sounds.  Abdominal:     General: Bowel sounds are normal. There is no  distension.     Palpations: Abdomen is soft. There is no mass.     Tenderness: There is no abdominal tenderness. There is no guarding.     Hernia: No hernia is present.  Musculoskeletal:     Right lower leg: No edema.     Left lower leg: No edema.  Skin:    General: Skin is warm.  Neurological:     General: No focal deficit present.     Mental Status: Craig [name] is alert and oriented to person, place, and time.  Psychiatric:        Mood and Affect: Mood normal.        Behavior: Behavior normal.     Labs on Admission: I have personally reviewed following labs and imaging studies No results for input(s): CKTOTAL, CKMB, TROPONINI in the last 72 hours. Lab Results  Component Value Date   WBC 9.1 08/28/2021   HGB 11.3 (L) 08/28/2021   HCT 38.0 (L) 08/28/2021   MCV 92.0 08/28/2021   PLT 313 08/28/2021    Recent Labs  Lab 08/28/21 1752  NA 137  K 2.6*  CL 90*  CO2 34*  BUN <5*  CREATININE 0.49*  CALCIUM 9.6  PROT 7.6  BILITOT 0.7  ALKPHOS 71  ALT 12  AST 31  GLUCOSE 166*   Lab Results  Component Value Date   TRIG 268 (H) 08/14/2021   Lab Results  Component Value Date   DDIMER 0.63 (H) 03/07/2021   COVID-19 Labs No results for input(s): DDIMER, FERRITIN, LDH, CRP in the last 72 hours. Lab Results  Component Value Date   SARSCOV2NAA NEGATIVE 08/28/2021   SARSCOV2NAA NEGATIVE 08/16/2021   SARSCOV2NAA NEGATIVE  08/09/2021   [place] NEGATIVE 07/08/2021    Radiological Exams on Admission: DG Chest 1 View  Result Date: 08/28/2021 CLINICAL DATA:  Suspected Sepsis EXAM: CHEST  1 VIEW COMPARISON:  Chest x-ray 08/15/2021, CT chest 08/10/2021, chest x-ray 07/13/2021 FINDINGS: The heart and mediastinal contours are unchanged. Persistent elevation of the right hemidiaphragm. No focal consolidation. Similar-appearing increased interstitial markings. No pleural effusion. No pneumothorax. No acute osseous abnormality. IMPRESSION: Similar-appearing increased interstitial markings.  Electronically Signed   By: Iven Finn M.D.   On: 08/28/2021 19:00   DG Knee 3 Views Right  Result Date: 08/28/2021 CLINICAL DATA:  Fever, right knee pain and swelling EXAM: RIGHT KNEE - 3 VIEW COMPARISON:  Knee x-ray report dated 07/03/2021. No images were available for comparison FINDINGS: Advanced tibiofemoral joint space loss with erosive changes along the medial femoral condyle and medial tibial plateau. More subtle erosive changes involving the central aspect of the lateral femoral condyle. Small joint effusion. Bones appear demineralized. Generalized soft tissue swelling. IMPRESSION: Advanced erosive changes of the right knee. Findings highly suggestive of septic arthritis. Similar findings were reported on previous radiographs dated 07/03/2021, although the images were not immediately available for comparison. Electronically Signed   By: Davina Poke D.O.   On: 08/28/2021 18:57    EKG: Independently reviewed.  EKG shows S tach 104 with no st changes.    Assessment/Plan: Principal Problem:   AMS (altered mental status) Active Problems:   Acute on chronic respiratory failure with hypoxemia (HCC)   Electrolyte abnormality   DM II (diabetes mellitus, type II), controlled ([place])   Hypertension   AMS: Suspect pt's AMS or difficult to arouse secondary to his  hypercapnia.  I have counseled him on using his trilogy and weight loss. Pt verbalized understanding. Noncon Head ct. D/W him to make individualized plan by sitting with each individual specialist.   A/C on c/h resp failure: Cont with bipap. Encouraged compliance.   Electrolyte abnormality: We will replace potassium and magnesium and follow.  Dm II: New diagnosis in 06/2021. Will start Glycemic protocol. D/c plan with metformin or ozempic for weight loss.  Htn: Blood pressure 126/73, pulse (!) 114, temperature 98.5 F (36.9 C), resp. rate (!) 35, SpO2 93 %. BP is low normal and will cutback dose of  metoprolol.   DVT prophylaxis:  Xarelto   Code Status:  Full code    Family Communication:  Breashears,[name] (Spouse)  [phone] (Mobile)   Disposition Plan:  Home.    Consults called:  None.   Admission status: Inpatient.   Medical Decision Making   Coding    Para Skeans MD [facility]  6 PM- 2 AM. Please contact me via secure Chat 6 PM-2 AM. To contact the [facility] Attending or Consulting provider [place] or covering provider during after hours [place], for this patient.   Check the care team in [facility] and look for a) attending/consulting [facility] provider listed and b) the [facility] team listed Log into [email or web address] and use [place]'s universal password to access. If you do not have the password, please contact the hospital operator. Locate the [facility] provider you are looking for under [facility] and page to a number that you can be directly reached. If you still have difficulty reaching the provider, please page the [facility] (Director on Call) for the Hospitalists listed on amion for assistance. [email or web address] 08/29/2021, 12:47 AM

## 2021-08-28 NOTE — ED Provider Triage Note (Signed)
Emergency Medicine Provider Triage Evaluation Note  Craig [name] , a 31 y.o. male  was evaluated in triage.  Patient has a history of pneumoperitoneum, respiratory failure, sepsis, hypertension and AKI presents to the emergency department with fever, worsening shortness of breath and weakness at home.  Review of Systems  Positive: Patient has fever, shortness of breath and weakness.  Negative: No chest pain, chest tightness or abdominal pain.   Physical Exam  There were no vitals taken for this visit. Gen:   Awake, no distress   Resp:  Normal effort  MSK:   Moves extremities without difficulty  Other:    Medical Decision Making  Medically screening exam initiated at 5:50 PM.  Appropriate orders placed.  Craig [name] was informed that the remainder of the evaluation will be completed by another provider, this initial triage assessment does not replace that evaluation, and the importance of remaining in the ED until their evaluation is complete.     Craig [name], [place] 08/28/21 1751

## 2021-08-29 ENCOUNTER — Telehealth: Payer: Medicaid Other | Admitting: Infectious Diseases

## 2021-08-29 DIAGNOSIS — M064 Inflammatory polyarthropathy: Secondary | ICD-10-CM | POA: Diagnosis present

## 2021-08-29 DIAGNOSIS — M00061 Staphylococcal arthritis, right knee: Secondary | ICD-10-CM

## 2021-08-29 DIAGNOSIS — R4182 Altered mental status, unspecified: Secondary | ICD-10-CM

## 2021-08-29 DIAGNOSIS — G6281 Critical illness polyneuropathy: Secondary | ICD-10-CM | POA: Diagnosis present

## 2021-08-29 DIAGNOSIS — E876 Hypokalemia: Secondary | ICD-10-CM

## 2021-08-29 DIAGNOSIS — J9622 Acute and chronic respiratory failure with hypercapnia: Secondary | ICD-10-CM | POA: Diagnosis not present

## 2021-08-29 DIAGNOSIS — M799 Soft tissue disorder, unspecified: Secondary | ICD-10-CM | POA: Diagnosis not present

## 2021-08-29 DIAGNOSIS — M1711 Unilateral primary osteoarthritis, right knee: Secondary | ICD-10-CM | POA: Diagnosis present

## 2021-08-29 DIAGNOSIS — Z8701 Personal history of pneumonia (recurrent): Secondary | ICD-10-CM | POA: Diagnosis not present

## 2021-08-29 DIAGNOSIS — M109 Gout, unspecified: Secondary | ICD-10-CM | POA: Diagnosis present

## 2021-08-29 DIAGNOSIS — J9621 Acute and chronic respiratory failure with hypoxia: Secondary | ICD-10-CM

## 2021-08-29 DIAGNOSIS — E119 Type 2 diabetes mellitus without complications: Secondary | ICD-10-CM

## 2021-08-29 DIAGNOSIS — E878 Other disorders of electrolyte and fluid balance, not elsewhere classified: Secondary | ICD-10-CM | POA: Diagnosis present

## 2021-08-29 DIAGNOSIS — M25512 Pain in left shoulder: Secondary | ICD-10-CM | POA: Diagnosis present

## 2021-08-29 DIAGNOSIS — Z6839 Body mass index (BMI) 39.0-39.9, adult: Secondary | ICD-10-CM | POA: Diagnosis not present

## 2021-08-29 DIAGNOSIS — G8929 Other chronic pain: Secondary | ICD-10-CM | POA: Insufficient documentation

## 2021-08-29 DIAGNOSIS — E1142 Type 2 diabetes mellitus with diabetic polyneuropathy: Secondary | ICD-10-CM | POA: Diagnosis not present

## 2021-08-29 DIAGNOSIS — M25561 Pain in right knee: Secondary | ICD-10-CM | POA: Insufficient documentation

## 2021-08-29 DIAGNOSIS — I10 Essential (primary) hypertension: Secondary | ICD-10-CM | POA: Diagnosis present

## 2021-08-29 DIAGNOSIS — U099 Post covid-19 condition, unspecified: Secondary | ICD-10-CM | POA: Diagnosis present

## 2021-08-29 DIAGNOSIS — B9561 Methicillin susceptible Staphylococcus aureus infection as the cause of diseases classified elsewhere: Secondary | ICD-10-CM | POA: Diagnosis present

## 2021-08-29 DIAGNOSIS — Z7401 Bed confinement status: Secondary | ICD-10-CM | POA: Diagnosis not present

## 2021-08-29 DIAGNOSIS — G9341 Metabolic encephalopathy: Secondary | ICD-10-CM | POA: Diagnosis present

## 2021-08-29 DIAGNOSIS — J9602 Acute respiratory failure with hypercapnia: Secondary | ICD-10-CM | POA: Diagnosis not present

## 2021-08-29 DIAGNOSIS — Z9981 Dependence on supplemental oxygen: Secondary | ICD-10-CM | POA: Diagnosis not present

## 2021-08-29 DIAGNOSIS — M25511 Pain in right shoulder: Secondary | ICD-10-CM | POA: Diagnosis present

## 2021-08-29 DIAGNOSIS — Z86718 Personal history of other venous thrombosis and embolism: Secondary | ICD-10-CM | POA: Diagnosis not present

## 2021-08-29 DIAGNOSIS — E662 Morbid (severe) obesity with alveolar hypoventilation: Secondary | ICD-10-CM | POA: Diagnosis present

## 2021-08-29 DIAGNOSIS — J9601 Acute respiratory failure with hypoxia: Secondary | ICD-10-CM | POA: Diagnosis not present

## 2021-08-29 DIAGNOSIS — Z20822 Contact with and (suspected) exposure to covid-19: Secondary | ICD-10-CM | POA: Diagnosis present

## 2021-08-29 LAB — COMPREHENSIVE METABOLIC PANEL
ALT: 11 U/L (ref 0–44)
AST: 30 U/L (ref 15–41)
Albumin: 2.8 g/dL — ABNORMAL LOW (ref 3.5–5.0)
Alkaline Phosphatase: 58 U/L (ref 38–126)
Anion gap: 9 (ref 5–15)
BUN: <5 mg/dL — ABNORMAL LOW (ref 6–20)
CO2: 35 mmol/L — ABNORMAL HIGH (ref 22–32)
Calcium: 9 mg/dL (ref 8.9–10.3)
Chloride: 92 mmol/L — ABNORMAL LOW (ref 98–111)
Creatinine, Ser: 0.54 mg/dL — ABNORMAL LOW (ref 0.61–1.24)
GFR, Estimated: >60 mL/min (ref >60)
Glucose, Bld: 143 mg/dL — ABNORMAL HIGH (ref 70–99)
Potassium: 2.8 mmol/L — ABNORMAL LOW (ref 3.5–5.1)
Sodium: 136 mmol/L (ref 135–145)
Total Bilirubin: 0.6 mg/dL (ref 0.3–1.2)
Total Protein: 6.8 g/dL (ref 6.5–8.1)

## 2021-08-29 LAB — PROCALCITONIN
Procalcitonin: 27.28 ng/mL
Procalcitonin: 18.6 ng/mL

## 2021-08-29 LAB — CBC
HCT: 34.2 % — ABNORMAL LOW (ref 39.0–52.0)
Hemoglobin: 9.9 g/dL — ABNORMAL LOW (ref 13.0–17.0)
MCH: 27 pg (ref 26.0–34.0)
MCHC: 28.9 g/dL — ABNORMAL LOW (ref 30.0–36.0)
MCV: 93.4 fL (ref 80.0–100.0)
Platelets: 270 10*3/uL (ref 150–400)
RBC: 3.66 MIL/uL — ABNORMAL LOW (ref 4.22–5.81)
RDW: 16.8 % — ABNORMAL HIGH (ref 11.5–15.5)
WBC: 9.2 10*3/uL (ref 4.0–10.5)
nRBC: 0 % (ref 0.0–0.2)

## 2021-08-29 LAB — PHOSPHORUS: Phosphorus: 3.7 mg/dL (ref 2.5–4.6)

## 2021-08-29 LAB — MAGNESIUM: Magnesium: 1.9 mg/dL (ref 1.7–2.4)

## 2021-08-29 LAB — CBG MONITORING, ED
Glucose-Capillary: 103 mg/dL — ABNORMAL HIGH (ref 70–99)
Glucose-Capillary: 172 mg/dL — ABNORMAL HIGH (ref 70–99)
Glucose-Capillary: 197 mg/dL — ABNORMAL HIGH (ref 70–99)

## 2021-08-29 LAB — CORTISOL: Cortisol, Plasma: 15.4 ug/dL

## 2021-08-29 MED ORDER — ACETAMINOPHEN 325 MG PO TABS
650.0000 mg | ORAL_TABLET | Freq: Three times a day (TID) | ORAL | Status: DC | PRN
  Administered 2021-08-29 – 2021-09-01 (×4): 650 mg via ORAL
  Filled 2021-08-29 (×4): qty 2

## 2021-08-29 MED ORDER — CEFADROXIL 500 MG PO CAPS
1000.0000 mg | ORAL_CAPSULE | Freq: Two times a day (BID) | ORAL | Status: DC
  Administered 2021-08-29: 1000 mg via ORAL
  Filled 2021-08-29 (×2): qty 2

## 2021-08-29 MED ORDER — METHOTREXATE 2.5 MG PO TABS: 20.0000 mg | ORAL_TABLET | ORAL | Status: DC

## 2021-08-29 MED ORDER — POTASSIUM CHLORIDE CRYS ER 20 MEQ PO TBCR
40.0000 meq | EXTENDED_RELEASE_TABLET | Freq: Once | ORAL | Status: AC
  Administered 2021-08-29: 40 meq via ORAL
  Filled 2021-08-29: qty 2

## 2021-08-29 MED ORDER — CEPHALEXIN 500 MG PO CAPS
1000.0000 mg | ORAL_CAPSULE | Freq: Four times a day (QID) | ORAL | Status: DC
  Administered 2021-08-30 – 2021-09-03 (×16): 1000 mg via ORAL
  Filled 2021-08-29 (×16): qty 2

## 2021-08-29 MED ORDER — POTASSIUM CHLORIDE 10 MEQ/100ML IV SOLN
10.0000 meq | INTRAVENOUS | Status: AC
  Administered 2021-08-29 (×4): 10 meq via INTRAVENOUS
  Filled 2021-08-29 (×3): qty 100

## 2021-08-29 MED ORDER — DULOXETINE HCL 30 MG PO CPEP
30.0000 mg | ORAL_CAPSULE | Freq: Every day | ORAL | Status: DC
  Administered 2021-08-29 – 2021-09-03 (×6): 30 mg via ORAL
  Filled 2021-08-29 (×7): qty 1

## 2021-08-29 MED ORDER — LISINOPRIL 5 MG PO TABS: 5.0000 mg | ORAL_TABLET | Freq: Every day | ORAL | Status: DC

## 2021-08-29 NOTE — Progress Notes (Signed)
[place] [facility] [place] NAME: Craig [name]    MR#:  [id number]  DATE OF BIRTH:  [birth year]  SUBJECTIVE:  patient was brought in with some altered mental status. Patient's mother and father in the ER room. He on BiPAP able to communicate very well. Appears alert and oriented answered all questions appropriately. Remains all tachycardic. Denies shortness of breath. He wants to go home. No fever. Patient remains bedbound secondary to his right knee infection. REVIEW OF SYSTEMS:   Review of Systems  Constitutional:  Negative for chills, fever and weight loss.  HENT:  Negative for ear discharge, ear pain and nosebleeds.   Eyes:  Negative for blurred vision, pain and discharge.  Respiratory:  Positive for shortness of breath. Negative for sputum production, wheezing and stridor.   Cardiovascular:  Negative for chest pain, palpitations, orthopnea and PND.  Gastrointestinal:  Negative for abdominal pain, diarrhea, nausea and vomiting.  Genitourinary:  Negative for frequency and urgency.  Musculoskeletal:  Negative for back pain and joint pain.  Neurological:  Positive for weakness. Negative for sensory change, speech change and focal weakness.  Psychiatric/Behavioral:  Negative for depression and hallucinations. The patient is not nervous/anxious.   Tolerating Diet:yes Tolerating PT:   DRUG ALLERGIES:   Allergies  Allergen Reactions   Tramadol Hcl     SEIZURES!!! DO NOT REORDER AT DISCHARGE!!!   Fentanyl Citrate Other (See Comments)    Makes patient more agitated & delirious   Amoxicillin Rash    Tolerated cefepime and cefazolin 08/2020. Tolerated Zosyn 07/2021    VITALS:  Blood pressure (!) 113/49, pulse (!) 130, temperature 98.5 F (36.9 C), resp. rate (!) 22, SpO2 98 %.  PHYSICAL EXAMINATION:   Physical Exam  GENERAL:  31 y.o.-year-old patient lying in the bed with no acute distress. Morbidly obese HEENT: Head atraumatic,  normocephalic. Oropharynx and nasopharynx clear. BIPAP+ LUNGS: Normal breath sounds bilaterally, no wheezing, rales, rhonchi. No use of accessory muscles of respiration.  CARDIOVASCULAR: S1, S2 normal. No murmurs, rubs, or gallops.  ABDOMEN: Soft, nontender, nondistended. Midline abdominal scar. Left lower quadrant colostomy present  EXTREMITIES: right knee decreased range of motion secondary to severe DJD NEUROLOGIC: nonfocal PSYCHIATRIC:  patient is alert and oriented x 3.  SKIN:  Pressure Injury 07/08/21 Scrotum Bilateral Stage 1 -  Intact skin with non-blanchable redness of a localized area usually over a bony prominence. red nonblanchable on sacrum, above sacrum and scrotal sack (Active)  07/08/21   Location: Scrotum  Location Orientation: Bilateral  Staging: Stage 1 -  Intact skin with non-blanchable redness of a localized area usually over a bony prominence.  Wound Description (Comments): red nonblanchable on sacrum, above sacrum and scrotal sack  Present on Admission: Yes        LABORATORY PANEL:  CBC Recent Labs  Lab 08/29/21 0425  WBC 9.2  HGB 9.9*  HCT 34.2*  PLT 270    Chemistries  Recent Labs  Lab 08/29/21 0425  NA 136  K 2.8*  CL 92*  CO2 35*  GLUCOSE 143*  BUN <5*  CREATININE 0.54*  CALCIUM 9.0  MG 1.9  AST 30  ALT 11  ALKPHOS 58  BILITOT 0.6   Cardiac Enzymes No results for input(s): TROPONINI in the last 168 hours. RADIOLOGY:  DG Chest 1 View  Result Date: 08/28/2021 CLINICAL DATA:  Suspected Sepsis EXAM: CHEST  1 VIEW COMPARISON:  Chest x-ray 08/15/2021, CT chest 08/10/2021, chest x-ray 07/13/2021 FINDINGS: The  heart and mediastinal contours are unchanged. Persistent elevation of the right hemidiaphragm. No focal consolidation. Similar-appearing increased interstitial markings. No pleural effusion. No pneumothorax. No acute osseous abnormality. IMPRESSION: Similar-appearing increased interstitial markings. Electronically Signed   By: Iven Finn M.D.   On: 08/28/2021 19:00   DG Knee 3 Views Right  Result Date: 08/28/2021 CLINICAL DATA:  Fever, right knee pain and swelling EXAM: RIGHT KNEE - 3 VIEW COMPARISON:  Knee x-ray report dated 07/03/2021. No images were available for comparison FINDINGS: Advanced tibiofemoral joint space loss with erosive changes along the medial femoral condyle and medial tibial plateau. More subtle erosive changes involving the central aspect of the lateral femoral condyle. Small joint effusion. Bones appear demineralized. Generalized soft tissue swelling. IMPRESSION: Advanced erosive changes of the right knee. Findings highly suggestive of septic arthritis. Similar findings were reported on previous radiographs dated 07/03/2021, although the images were not immediately available for comparison. Electronically Signed   By: Davina Poke D.O.   On: 08/28/2021 18:57   ASSESSMENT AND PLAN:  Craig [name] is a 31 y.o. 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   Acute on chronic hypoxic/hypercapnia respiratory failure present on admission acute metabolic encephalopathy-- resolved -- came in with ABG with Paco2 of 79 -- currently is on BiPAP. He is alert and oriented. -- Patient uses trilogy at home. He also uses chronic home oxygen. -- He follows with pulmonary Dr.Aleskerov -- continue to monitor his mentation. -- PRN bronchodilators  history of MSSA right knee septic arthritis -- patient is bedbound secondary to unable to bear weight on  right knee -- follows with Dr. Rudene Christians -- continue overall Cefradoxil for total 14 days. Patient follows with ID Dr. Steva Ready  history of upper extremity DVT -- continue Xarelto -- recent CT chest was negative for PE  Hypokalemia -- pharmacy to replete  type II diabetes, morbid obesity -- hemoglobin 6.8 in November 2022 -- diet control at home  morbid obesity -- complicates prognosis  will have physical therapy see patient.  Procedures: Family communication : mother and father in the ER Consults : CODE STATUS: full DVT Prophylaxis : Xarelto Level of care: Telemetry Cardiac Status is: Inpatient  Remains inpatient appropriate because: acute on chronic hypoxic hypercapnia respiratory failure. Anticipate discharge 1 to 2 days if continues to show improvement.        TOTAL TIME TAKING CARE OF THIS PATIENT: 30 minutes.  >50% time spent on counselling and coordination of care  Note: This dictation was prepared with Dragon dictation along with smaller phrase technology. Any transcriptional errors that result from this process are unintentional.  Fritzi Mandes M.D    [facility]   CC: Primary care physician; [name], MD [name], male   DOB: 29-Jul-1991, 31 y.o.   MRN: [id number]

## 2021-08-29 NOTE — Progress Notes (Signed)
Pharmacy Electrolyte Monitoring Consult:  Pharmacy consulted to assist in monitoring and replacing electrolytes in this 31 y.o. male admitted on 08/28/2021 with No chief complaint on file.  -on lasix / KCL PTA  Labs:  Sodium (mmol/L)  Date Value  08/29/2021 136   Potassium (mmol/L)  Date Value  08/29/2021 2.8 (L)   Magnesium (mg/dL)  Date Value  08/28/2021 1.6 (L)   Phosphorus (mg/dL)  Date Value  08/23/2021 5.9 (H)   Calcium (mg/dL)  Date Value  08/29/2021 9.0   Albumin (g/dL)  Date Value  08/29/2021 2.8 (L)    Assessment/Plan: K 2.8  Mag&Phos (ordered as add on)  Scr 0.54 - Will order KCL 10 meq IV x 4 doses and KCL 40 meq PO x 1 dose - ordered Mag/Phos labs -f/u K at 1800 -f/u electolytes w/ am labs  [name] A 08/29/2021 9:00 AM

## 2021-08-29 NOTE — ED Notes (Addendum)
Pt moved to hospital Stretcher with the help of Jenny Reichmann tech, IT sales professional, Patent examiner.  The wife of pt also helped with the transfer. Slide board was used.  Pt very thankful and appreciative of the new bed.  Mickel Baas tech also notified [employer] of pts need for BiPap machine needed for sleeping. Risk manager doctor for request.

## 2021-08-29 NOTE — Progress Notes (Signed)
[facility] ED 08A [facility]) Hospital Liaison note:  This is a pending outpatient-based Palliative Care patient. Will continue to follow for disposition.  Please call with any outpatient palliative questions or concerns.  Thank you, Lorelee Market, LPN [facility] Liaison [phone]

## 2021-08-29 NOTE — ED Notes (Signed)
RN notified that pt IV of Potassium was out and there was two new bags hanging behind it.  RN also notified that pt has hospital stretcher waiting on him in [place] hallway and that tech would be happy to help move pt when ever she was ready.

## 2021-08-29 NOTE — Consult Note (Signed)
NAME: Craig [name]  DOB: 1990-08-23  MRN: [id number]  Date/Time: 08/29/2021 11:58 PM  REQUESTING PROVIDER Dr.Funke Subjective:  REASON FOR CONSULT: cellulitis ? Craig [name] is a 31 y.o. male well know o me from multiple hospitalizations before, complicated medica history presented to the hosp brought in by EMS as his mom found him unresponsive with BIPAP on, but on sternal rub he woke up- HE has had these episodes a few times before- He did not want to come to the ED but EMS wanted him to get checked  His vitals by EMS were RR  35 and BP 161/88 and HR 120, sats 96% and temp was normal In the ED BP 117/44, Temp 98.5, Pulse 117 Sats 99% on 3 L oxygen WBC 9.2, HB 9.9, plt 270 Na 137, K 2.6, cr 0.49, Mag 1.6 Procal high He was also having rt knee pain and swelling BC sent The EDP was concerned about swelling legs and mild erythema - cellulitis was questioned but patient has had this for a long time Pt was admitted for AMS due to posible hypercapnia I am seeing the patient to r/o recurrent infection of the rt knee  Medical History Patient has a complicated medical history.  He was initially admitted on 11/22/2019 with diverticulitis/colon perforation and underwent emergent partial colectomy with end colostomy.  During that hospitalization he needed Solu-Medrol for an acute gout attack.  He was discharged on 12/01/2019.  He was readmitted in November 2021 underwent colostomy takedown robotic lap was attempted but was converted to laparotomy.,  Small bowel resection and parastomal hernia repair on 06/28/2020.  He was discharged on 07/03/2020.  He was readmitted on 07/08/2020 with sepsis and CT showed pneumoperitoneum.  He underwent laparotomy and partial colectomy with colostomy on 07/08/2020 and was discharged on 07/14/2020. He presented to the ED on 08/09/2020 after testing positive for COVID with fever and hypotension.  He was started on remdesivir Decadron azithromycin and heated high  flow nasal oxygen.  He was intubated and transferred to ICU.  He had tracheostomy On 09/06/2020 he had a fever and his blood and respiratory culture was positive for staph aureus.  He was placed on cefazolin.  During that hospitalization TEE could not be done because of tracheostomy.  He also had PE and MI during that hospitalization.  He was transferred to [facility] on 09/24/2020 with tracheostomy, PEG.  It looks like patient was discharged sometime in February 2022 home.  He had a telemedicine appointment with his PCP on 10/20/2020 where his main complaint was neuropathy pain in his arms and his legs. He also was referred to a neurologist at [facility] clinic and underwent EMG and NCV on 02/01/2021.  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  He saw a rheumatologist on 02/06/2021. For right knee pain.  He has a history of gout in the right knee was injected with bupivacaine and Kenalog.  He was placed on oral prednisone. Other abnormalities were B12 deficiency, vitamin D deficiency, IgM monoclonal protein deviation with lambda light chain, elevated sed rate 51 and CRP and low titer anti-CCP antibodies but other serologies were negative. He last saw Dr. Jefm Bryant on 07/03/2020 was investigating for right septic arthritis.  He had an x-ray and that apparently indicated bone-on-bone changes and he was referred to Dr. Candelaria Stagers for knee aspiration but that was just not possible as it was a dry tap. He was hospitalized in [facility] 11/19-12/6/22 for MSSA bacteremia, MSSA rt knee septic  arthritis underwent washout, Acute on chronic hypoxic and hypercarbic respiratory failure. TEE on 07/12/21 was neg HE was discharged home on 07/25/21 IV cefazolin 3grams IV Q8 to complete 4 weeks  on 08/10/21. He was readmitted on 08/09/21 for hypoxia . Was intubated for a short period of time and then went on BIPAP. HE completed Iv cefazolin and went on PO cefadroxil for 4 weeks  . HE was  discharged home on 08/23/21.  As per his wife he has had low grade fever 99-  and 100.2 ( once) He has not been able to walk since July when the rt knee pain started Before that he was getting PT and was walking in his porch 100 meters. He is now sleeping in reclining wheel chair   No cough, has shortness of breath No diarrhea   Past Medical History:  Diagnosis Date   Gout    Hypertension    Rupture of bowel ([place])    MSSA bacteremia MSSA rt knee septic arthritis  Past Surgical History:  Procedure Laterality Date   COLONOSCOPY WITH PROPOFOL N/A 05/18/2020   Procedure: COLONOSCOPY WITH PROPOFOL;  Surgeon: Benjamine Sprague, DO;  Location: [facility];  Service: General;  Laterality: N/A;   COLOSTOMY N/A 11/22/2019   Procedure: COLOSTOMY;  Surgeon: Herbert Pun, MD;  Location: [facility];  Service: General;  Laterality: N/A;   KNEE ARTHROSCOPY Right 07/11/2021   Procedure: SYNOVIAL BIOPSY AND ARTHROSCOPIC IRRGATION AND DEBRIDEMENT OF SEPTIC KNEE;  Surgeon: Hessie Knows, MD;  Location: [facility];  Service: Orthopedics;  Laterality: Right;   KNEE ARTHROSCOPY Right 07/18/2021   Procedure: ARTHROSCOPY KNEE, IRRIGATION AND DEBRIDEMENT;  Surgeon: Hessie Knows, MD;  Location: [facility];  Service: Orthopedics;  Laterality: Right;   LAPAROTOMY N/A 11/22/2019   Procedure: EXPLORATORY LAPAROTOMY;  Surgeon: Herbert Pun, MD;  Location: [facility];  Service: General;  Laterality: N/A;   LAPAROTOMY N/A 07/08/2020   Procedure: EXPLORATORY LAPAROTOMY;  Surgeon: Herbert Pun, MD;  Location: [facility];  Service: General;  Laterality: N/A;   PARTIAL COLECTOMY N/A 11/22/2019   Procedure: PARTIAL COLECTOMY;  Surgeon: Herbert Pun, MD;  Location: [facility];  Service: General;  Laterality: N/A;   PEG PLACEMENT N/A 09/14/2020   Procedure: PERCUTANEOUS ENDOSCOPIC GASTROSTOMY (PEG) PLACEMENT;  Surgeon: Benjamine Sprague, DO;  Location: [facility];  Service: General;  Laterality: N/A;  TRAVEL  CASE [place] Right 07/11/2021   Procedure: SYNOVIAL BIOPSY;  Surgeon: Hessie Knows, MD;  Location: [facility];  Service: Orthopedics;  Laterality: Right;   TRACHEOSTOMY TUBE PLACEMENT N/A 09/13/2020   Procedure: TRACHEOSTOMY;  Surgeon: Beverly Gust, MD;  Location: [facility];  Service: ENT;  Laterality: N/A;   XI ROBOTIC ASSISTED COLOSTOMY TAKEDOWN N/A 06/28/2020   Procedure: XI ROBOTIC ASSISTED COLOSTOMY TAKEDOWN CONVERTED TO OPEN PROCEDURE;  Surgeon: Herbert Pun, MD;  Location: [facility];  Service: General;  Laterality: N/A;    Social History   Socioeconomic History   Marital status: Married    Spouse name: Not on file   Number of children: Not on file   Years of education: Not on file   Highest education level: Not on file  Occupational History   Not on file  Tobacco Use   Smoking status: Never   Smokeless tobacco: Current    Types: Chew   Tobacco comments:    Not ready to quit.   Vaping Use   Vaping Use: Never used  Substance and Sexual Activity   Alcohol use: Yes  Comment: pint to a fifth per day per patient . Patient reports he hasn't had anything to drink in three weeks.   Drug use: Never   Sexual activity: Yes    Birth control/protection: None  Other Topics Concern   Not on file  Social History Narrative   Not on file   Social Determinants of Health   Financial Resource Strain: Not on file  Food Insecurity: Not on file  Transportation Needs: Not on file  Physical Activity: Not on file  Stress: Not on file  Social Connections: Not on file  Intimate Partner Violence: Not on file    FH Father has  FSGS, nephrotic syndrome  Allergies  Allergen Reactions   Tramadol Hcl     SEIZURES!!! DO NOT REORDER AT DISCHARGE!!!   Fentanyl Citrate Other (See Comments)    Makes patient more agitated & delirious   Amoxicillin Rash    Tolerated cefepime and cefazolin 08/2020. Tolerated Zosyn 07/2021   I? Current  Facility-Administered Medications  Medication Dose Route Frequency Provider Last Rate Last Admin   0.9 %  sodium chloride infusion  250 mL Intravenous PRN Para Skeans, MD       acetaminophen (TYLENOL) tablet 650 mg  650 mg Oral Q8H PRN Para Skeans, MD   650 mg at 08/29/21 2056   albuterol (PROVENTIL) (2.5 MG/3ML) 0.083% nebulizer solution 1.5 mL  1.5 mL Nebulization TID PRN Para Skeans, MD       budesonide (PULMICORT) nebulizer solution 0.25 mg  0.25 mg Nebulization Daily Florina Ou V, MD   0.25 mg at 08/29/21 2035   cefadroxil (DURICEF) capsule 1,000 mg  1,000 mg Oral BID Fritzi Mandes, MD   1,000 mg at 08/29/21 1806   DULoxetine (CYMBALTA) DR capsule 30 mg  30 mg Oral Daily Florina Ou V, MD   30 mg at 08/29/21 1010   insulin aspart (novoLOG) injection 0-15 Units  0-15 Units Subcutaneous TID WC Para Skeans, MD   3 Units at 08/29/21 1717   insulin aspart (novoLOG) injection 0-5 Units  0-5 Units Subcutaneous QHS Para Skeans, MD       nicotine (NICODERM CQ - dosed in mg/24 hours) patch 21 mg  21 mg Transdermal Daily Para Skeans, MD       rivaroxaban (XARELTO) tablet 20 mg  20 mg Oral Q supper Florina Ou V, MD   20 mg at 08/29/21 1806   sodium chloride flush (NS) 0.9 % injection 3 mL  3 mL Intravenous Q12H Florina Ou V, MD   3 mL at 08/29/21 1009   sodium chloride flush (NS) 0.9 % injection 3 mL  3 mL Intravenous PRN Para Skeans, MD       valproic acid (DEPAKENE) 250 MG capsule 500 mg  500 mg Oral Q8H Florina Ou V, MD   500 mg at 08/28/21 2233   Current Outpatient Medications  Medication Sig Dispense Refill   acetaminophen (TYLENOL) 650 MG CR tablet Take 650 mg by mouth every 8 (eight) hours as needed.     allopurinol (ZYLOPRIM) 300 MG tablet Take 300 mg by mouth daily.     ascorbic acid (VITAMIN C) 500 MG tablet Take 1 tablet (500 mg total) by mouth 2 (two) times daily. (Patient taking differently: Take 1,000 mg by mouth at bedtime.) 60 tablet 0   budesonide (PULMICORT) 0.25  MG/2ML nebulizer solution Take 0.25 mg by nebulization 2 (two) times daily.     cefadroxil (DURICEF) 1  g tablet Take 1 tablet (1 g total) by mouth 2 (two) times daily for 20 days. 40 tablet 0   cetirizine (ZYRTEC) 10 MG tablet Take 10 mg by mouth daily.     Cholecalciferol (VITAMIN D3) 25 MCG (1000 UT) CAPS Take 1 capsule by mouth daily.     clotrimazole (LOTRIMIN) 1 % cream Apply topically 2 (two) times daily. 30 g 0   [employer] SALINE NASAL SPRAY 0.65 % nasal spray SMARTSIG:1 Spray(s) Both Nares Every 4 Hours PRN     docusate sodium (COLACE) 100 MG capsule Take 1 capsule (100 mg total) by mouth 2 (two) times daily as needed for mild constipation. 10 capsule 0   doxazosin (CARDURA) 1 MG tablet Take 1 mg by mouth daily.     DULoxetine (CYMBALTA) 60 MG capsule Take 60 mg by mouth daily.     famotidine (PEPCID) 20 MG tablet Take 20 mg by mouth 2 (two) times daily as needed.     folic acid (FOLVITE) 1 MG tablet Take 1 mg by mouth daily.     furosemide (LASIX) 40 MG tablet Take 1 tablet (40 mg total) by mouth 2 (two) times daily. (Patient taking differently: Take 40 mg by mouth daily.) 60 tablet 0   lidocaine (LIDODERM) 5 % 1 patch daily as needed.     melatonin 3 MG TABS tablet Take 6 mg by mouth at bedtime as needed.     metoprolol succinate (TOPROL-XL) 100 MG 24 hr tablet Take 100 mg by mouth daily.     Multiple Vitamins-Minerals (MULTIVITAMIN WITH MINERALS) tablet Take 1 tablet by mouth daily.     nicotine (NICODERM CQ - DOSED IN MG/24 HOURS) 21 mg/24hr patch Place 1 patch (21 mg total) onto the skin daily. (Patient taking differently: Place 21 mg onto the skin daily as needed. As needed for hospital admission.) 28 patch 0   nystatin (MYCOSTATIN/NYSTOP) powder Apply topically 3 (three) times daily. (Patient taking differently: Apply topically 3 (three) times daily as needed.) 15 g 0   ondansetron (ZOFRAN) 4 MG tablet Take 4 mg by mouth every 8 (eight) hours as needed.     polyethylene glycol (MIRALAX /  GLYCOLAX) 17 g packet Take 17 g by mouth daily. 14 each 0   Potassium Chloride ER 20 MEQ TBCR Take 10 mEq by mouth 2 (two) times daily. (Patient taking differently: Take 20 mEq by mouth daily.) 60 tablet 0   pregabalin (LYRICA) 200 MG capsule Take 200 mg by mouth 2 (two) times daily.     rivaroxaban (XARELTO) 20 MG TABS tablet Take 20 mg by mouth daily with supper.     albuterol (ACCUNEB) 1.25 MG/3ML nebulizer solution Take 1 ampule by nebulization 3 (three) times daily as needed. (Patient not taking: Reported on 08/28/2021)     blood glucose meter kit and supplies KIT Dispense based on patient and insurance preference. Use up to four times daily as directed. 1 each 0   furosemide (LASIX) 20 MG tablet Take 20 mg by mouth daily. (Patient not taking: Reported on 08/28/2021)     insulin aspart (NOVOLOG) 100 UNIT/ML injection Inject 10 Units into the skin 3 (three) times daily with meals. (Patient not taking: Reported on 08/28/2021) 10 mL 11   lisinopril (ZESTRIL) 5 MG tablet Take 5 mg by mouth daily. (Patient not taking: Reported on 08/28/2021)     methotrexate (RHEUMATREX) 2.5 MG tablet Take 20 mg by mouth once a week. (Patient not taking: Reported on 08/28/2021)  Omega-3 Fatty Acids (FISH OIL) 1000 MG CAPS Take 1 capsule by mouth daily. (Patient not taking: Reported on 08/28/2021)     valproic acid (DEPAKENE) 250 MG capsule Take 2 capsules (500 mg total) by mouth every 8 (eight) hours. (Patient not taking: Reported on 08/28/2021) 180 capsule 2     Abtx:  Anti-infectives (From admission, onward)    Start     Dose/Rate Route Frequency Ordered Stop   08/29/21 1730  cefadroxil (DURICEF) capsule 1,000 mg        1,000 mg Oral 2 times daily 08/29/21 1717 09/12/21 2159       REVIEW OF SYSTEMS:  Const: temp 99-100.1  Eyes: negative diplopia or visual changes, negative eye pain ENT: negative coryza, negative sore throat Resp: negative cough, hemoptysis, has dyspnea Cards: negative for chest pain,  palpitations, has lower extremity edema GU: negative for frequency, dysuria and hematuria GI: Negative for abdominal pain, diarrhea, bleeding, constipation, has colostomy Skin: negative for rash and pruritus Heme: negative for easy bruising and gum/nose bleeding MS: rt knee and left shoulder pain Has restricted movt in rt shoulder joint as well Neurolo:negative for headaches, dizziness, vertigo, memory problems  Psych:  anxiety, depression  Endocrine: negative for thyroid, diabetes Allergy/Immunology-amoxicilllin- but has taken zosyn, cephalopsporins Objective:  VITALS:  BP 125/65 (BP Location: Left Arm)    Pulse (!) 117    Temp 98.5 F (36.9 C)    Resp (!) 24    SpO2 94%  PHYSICAL EXAM:  General: Alert, cooperative, some resp  distress,tachypnea  deconditioned Head: Normocephalic, without obvious abnormality, atraumatic. Eyes: Conjunctivae clear, anicteric sclerae. Pupils are equal ENT Nares normal. No drainage or sinus tenderness. Lips, mucosa, and tongue normal. No Thrush Neck: Supple, symmetrical, no adenopathy, thyroid: non tender no carotid bruit and no JVD. Lungs: b/l air entry. Heart: Tachycardia Abdomen: Soft, non-tender,not distended. Bowel sounds normal. No masses, colostomy Extremities: rt knee held in semi flexion- tender to touch infra patella Not erythematous Not warm Restricted mobility Puffy feet Mild erythema both feet Skin: left arm scaly erythema Lymph: Cervical, supraclavicular normal. Neurologic: unable to assess motor strength Pertinent Labs Lab Results CBC    Component Value Date/Time   WBC 9.2 08/29/2021 0425   RBC 3.66 (L) 08/29/2021 0425   HGB 9.9 (L) 08/29/2021 0425   HCT 34.2 (L) 08/29/2021 0425   PLT 270 08/29/2021 0425   MCV 93.4 08/29/2021 0425   MCH 27.0 08/29/2021 0425   MCHC 28.9 (L) 08/29/2021 0425   RDW 16.8 (H) 08/29/2021 0425   LYMPHSABS 2.2 08/28/2021 1752   MONOABS 1.4 (H) 08/28/2021 1752   EOSABS 0.2 08/28/2021 1752    BASOSABS 0.0 08/28/2021 1752    CMP Latest Ref Rng & Units 08/29/2021 08/28/2021 08/23/2021  Glucose 70 - 99 mg/dL 143(H) 166(H) -  BUN 6 - 20 mg/dL <5(L) <5(L) -  Creatinine 0.61 - 1.24 mg/dL 0.54(L) 0.49(L) -  Sodium 135 - 145 mmol/L 136 137 -  Potassium 3.5 - 5.1 mmol/L 2.8(L) 2.6(LL) 3.4(L)  Chloride 98 - 111 mmol/L 92(L) 90(L) -  CO2 22 - 32 mmol/L 35(H) 34(H) -  Calcium 8.9 - 10.3 mg/dL 9.0 9.6 -  Total Protein 6.5 - 8.1 g/dL 6.8 7.6 -  Total Bilirubin 0.3 - 1.2 mg/dL 0.6 0.7 -  Alkaline Phos 38 - 126 U/L 58 71 -  AST 15 - 41 U/L 30 31 -  ALT 0 - 44 U/L 11 12 -      Microbiology: Recent Results (  from the past 240 hour(s))  Culture, blood (Routine x 2)     Status: None (Preliminary result)   Collection Time: 08/28/21  5:52 PM   Specimen: BLOOD  Result Value Ref Range Status   Specimen Description BLOOD BLOOD RIGHT WRIST  Final   Special Requests   Final    BOTTLES DRAWN AEROBIC AND ANAEROBIC Blood Culture adequate volume   Culture   Final    NO GROWTH < 12 HOURS Performed at [facility], [address] [place], [place] [address]    Report Status PENDING  Incomplete  Resp Panel by RT-PCR (Flu A&B, Covid) Nasopharyngeal Swab     Status: None   Collection Time: 08/28/21  5:52 PM   Specimen: Nasopharyngeal Swab; Nasopharyngeal(NP) swabs in vial transport medium  Result Value Ref Range Status   SARS Coronavirus 2 by RT PCR NEGATIVE NEGATIVE Final    Comment: (NOTE) SARS-CoV-2 target nucleic acids are NOT DETECTED.  The SARS-CoV-2 RNA is generally detectable in upper respiratory specimens during the acute phase of infection. The lowest concentration of SARS-CoV-2 viral copies this assay can detect is 138 copies/mL. A negative result does not preclude SARS-Cov-2 infection and should not be used as the sole basis for treatment or other patient management decisions. A negative result may occur with  improper specimen collection/handling, submission of specimen  other than nasopharyngeal swab, presence of viral mutation(s) within the areas targeted by this assay, and inadequate number of viral copies(<138 copies/mL). A negative result must be combined with clinical observations, patient history, and epidemiological information. The expected result is Negative.  Fact Sheet for Patients:  EntrepreneurPulse.com.au  Fact Sheet for Healthcare Providers:  IncredibleEmployment.be  This test is no t yet approved or cleared by the [place] FDA and  has been authorized for detection and/or diagnosis of SARS-CoV-2 by FDA under an Emergency Use Authorization (EUA). This EUA will remain  in effect (meaning this test can be used) for the duration of the COVID-19 declaration under Section 564(b)(1) of the Act, 21 U.S.C.section 360bbb-3(b)(1), unless the authorization is terminated  or revoked sooner.       Influenza A by PCR NEGATIVE NEGATIVE Final   Influenza B by PCR NEGATIVE NEGATIVE Final    Comment: (NOTE) The Xpert Xpress SARS-CoV-2/FLU/RSV plus assay is intended as an aid in the diagnosis of influenza from Nasopharyngeal swab specimens and should not be used as a sole basis for treatment. Nasal washings and aspirates are unacceptable for Xpert Xpress SARS-CoV-2/FLU/RSV testing.  Fact Sheet for Patients: EntrepreneurPulse.com.au  Fact Sheet for Healthcare Providers: IncredibleEmployment.be  This test is not yet approved or cleared by the [place] FDA and has been authorized for detection and/or diagnosis of SARS-CoV-2 by FDA under an Emergency Use Authorization (EUA). This EUA will remain in effect (meaning this test can be used) for the duration of the COVID-19 declaration under Section 564(b)(1) of the Act, 21 U.S.C. section 360bbb-3(b)(1), unless the authorization is terminated or revoked.  Performed at [facility], [address]  [place], [place] [address]   Culture, blood (Routine x 2)     Status: None (Preliminary result)   Collection Time: 08/28/21  9:03 PM   Specimen: BLOOD  Result Value Ref Range Status   Specimen Description BLOOD [facility]  Final   Special Requests BOTTLES DRAWN AEROBIC AND ANAEROBIC BCLV  Final   Culture   Final    NO GROWTH < 12 HOURS Performed at [facility], [address] [place], [place] [address]    Report Status PENDING  Incomplete    IMAGING RESULTS:  I have personally reviewed the films ? Impression/Recommendation ? ?Acute hypoxic/hypercarbic  resp failure ?multifactorial COVID related lung disease OSA Critical illness polyneuropathy ( is this affecting his resp/intercostal muscles ? Obesity hypoventilation syndrome Pt has BIPAP  Musculoskeletal disorder -due to critical illness polyneuropathy, long covid  especially rt knee and left shoulder and inability to walk ? Inflammatory arthritis  Recent past h/o Rt septic knee arthritis due to MSSA  Recent H/o MSSA bacteremia Received 4 weeks of IV cefazolin which he completed on 08/10/21 followed by PO cefadroxil 1 gram BID which he is on it currently Has low grade fever Need to look at rt knee - Needs imaging- He cannot tolerate MRI- ? CT , if fluid it needs to be aspirated Blood culture-  Will do keflex 1 gram Po Q6 hrly instead of cefadroxil  Tachycardia ? Autonomic component TEE 07/12/21 no vegetation EF 60-65%  GOUT  ___________________________________________________ Discussed with patient, and his wife and EDP It would be best to do a team conference with all specialists and Hospitalist Note:  This document was prepared using Dragon voice recognition software and may include unintentional dictation errors.

## 2021-08-29 NOTE — ED Notes (Signed)
Pt a&o x 4. Pt states he is unable to lay flat for CT scan. Pt asking if CT was ordered due to AMS. Pt then states that he does not want to have the CT completed. Posey Pronto MD notified.

## 2021-08-29 NOTE — Progress Notes (Signed)
Inpatient Diabetes Program Recommendations  AACE/ADA: New Consensus Statement on Inpatient Glycemic Control   Target Ranges:  Prepandial:   less than 140 mg/dL      Peak postprandial:   less than 180 mg/dL (1-2 hours)      Critically ill patients:  140 - 180 mg/dL    Latest Reference Range & Units 08/29/21 07:37  Glucose-Capillary 70 - 99 mg/dL 103 (H)    Latest Reference Range & Units 08/28/21 17:52 08/29/21 04:25  Glucose 70 - 99 mg/dL 166 (H) 143 (H)    Latest Reference Range & Units 07/08/21 18:29  Hemoglobin A1C 4.8 - 5.6 % 6.8 (H)   Review of Glycemic Control  Diabetes history: DM2 (dx during hospitalization 07/08/21-07/25/21)  Outpatient Diabetes medications: None Current orders for Inpatient glycemic control: Novolog 0-15 units TID with meals, Novolog 0-5 units QHS  Inpatient Diabetes Program Recommendations:    HbgA1C:  A1C 6.8% on 07/08/21. Inpatient diabetes coordinator spoke with patient and his wife on 07/18/21 during prior hospitalization and educated on new DM dx and insulin.  NOTE: Patient was inpatient 07/08/21-07/25/21 and dx with DM2 during that hospitalization. Patient was on steroids during hospitalization and also discharged on Prednisone; therefore he was discharged on Lantus and Novolog. Inpatient diabetes coordinator spoke with patient and his wife on 07/18/21 and educated on new DM dx and insulin at that time.  Patient was admitted again 08/10/21-08/23/21 and per discharge summary on 08/23/21, Lantus was stopped and Novolog 10 units TID with meals was continued. Initial lab glucose was 166 mg/dl on 19/23 at 17:52 and fasting CBG 103 today at 7:37 am. Novolog correction scale currently ordered; no insulin given since arrival to ED. Will follow along.  Thanks, Barnie Alderman, RN, MSN, CDE Diabetes Coordinator Inpatient Diabetes Program [phone] (Team Pager from 8am to 5pm)

## 2021-08-30 ENCOUNTER — Other Ambulatory Visit: Payer: Self-pay

## 2021-08-30 ENCOUNTER — Inpatient Hospital Stay: Payer: Medicaid Other

## 2021-08-30 LAB — URINE CULTURE: Culture: <10000 — AB

## 2021-08-30 LAB — BASIC METABOLIC PANEL
Anion gap: 7 (ref 5–15)
Anion gap: 8 (ref 5–15)
BUN: <5 mg/dL — ABNORMAL LOW (ref 6–20)
BUN: <5 mg/dL — ABNORMAL LOW (ref 6–20)
CO2: 35 mmol/L — ABNORMAL HIGH (ref 22–32)
CO2: 35 mmol/L — ABNORMAL HIGH (ref 22–32)
Calcium: 9.1 mg/dL (ref 8.9–10.3)
Calcium: 9.3 mg/dL (ref 8.9–10.3)
Chloride: 95 mmol/L — ABNORMAL LOW (ref 98–111)
Chloride: 95 mmol/L — ABNORMAL LOW (ref 98–111)
Creatinine, Ser: 0.45 mg/dL — ABNORMAL LOW (ref 0.61–1.24)
Creatinine, Ser: 0.46 mg/dL — ABNORMAL LOW (ref 0.61–1.24)
GFR, Estimated: >60 mL/min (ref >60)
GFR, Estimated: >60 mL/min (ref >60)
Glucose, Bld: 133 mg/dL — ABNORMAL HIGH (ref 70–99)
Glucose, Bld: 101 mg/dL — ABNORMAL HIGH (ref 70–99)
Potassium: 2.8 mmol/L — ABNORMAL LOW (ref 3.5–5.1)
Potassium: 2.9 mmol/L — ABNORMAL LOW (ref 3.5–5.1)
Sodium: 137 mmol/L (ref 135–145)
Sodium: 138 mmol/L (ref 135–145)

## 2021-08-30 LAB — MAGNESIUM: Magnesium: 1.7 mg/dL (ref 1.7–2.4)

## 2021-08-30 LAB — POTASSIUM: Potassium: 3.5 mmol/L (ref 3.5–5.1)

## 2021-08-30 LAB — CBG MONITORING, ED
Glucose-Capillary: 97 mg/dL (ref 70–99)
Glucose-Capillary: 157 mg/dL — ABNORMAL HIGH (ref 70–99)
Glucose-Capillary: 170 mg/dL — ABNORMAL HIGH (ref 70–99)

## 2021-08-30 LAB — PROCALCITONIN: Procalcitonin: 7.05 ng/mL

## 2021-08-30 IMAGING — US US EXTREM LOW*R* LIMITED
1 series · 14 of 20 positions shown · non-contrast
Comparison: Right knee radiographs [DATE]

CLINICAL DATA: Right knee pain and swelling for 6 months. Status
post right knee arthroscopy [DATE]

EXAM:
ULTRASOUND RIGHT LOWER EXTREMITY LIMITED
TECHNIQUE: Ultrasound examination of the lower extremity soft tissues was
performed in the area of clinical concern.

[Series 1: us soft tissue lower extremity limited right (non- · 14 of 20 slices shown]
[im 1/20]
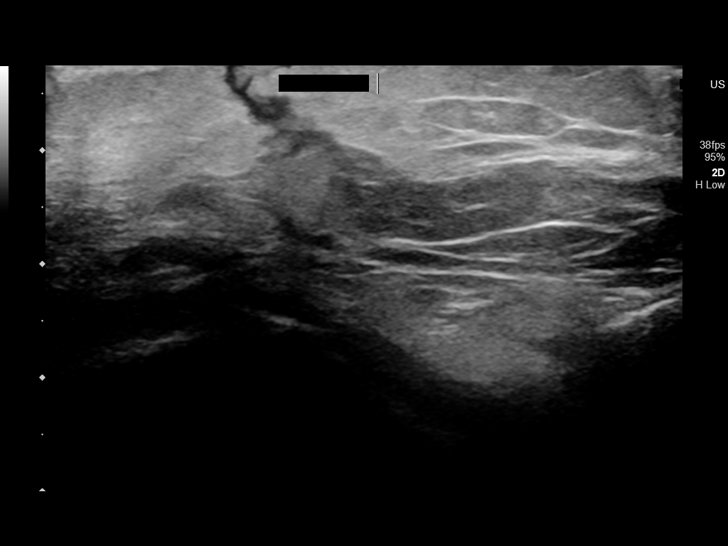
[im 3/20]
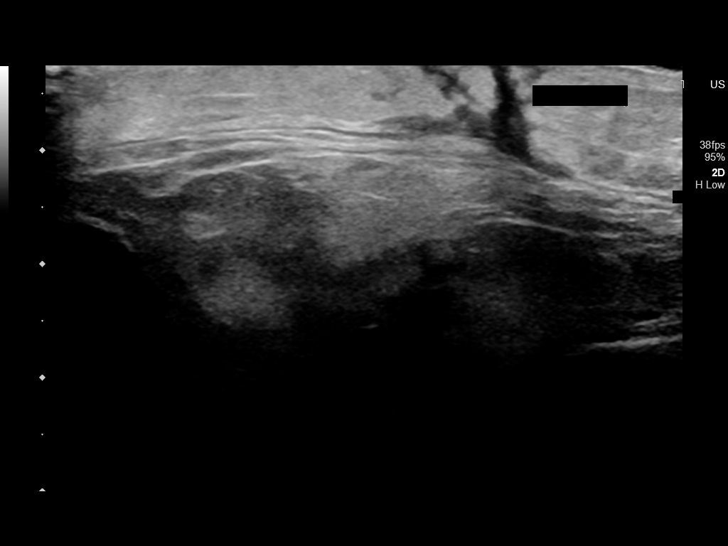
[im 4/20]
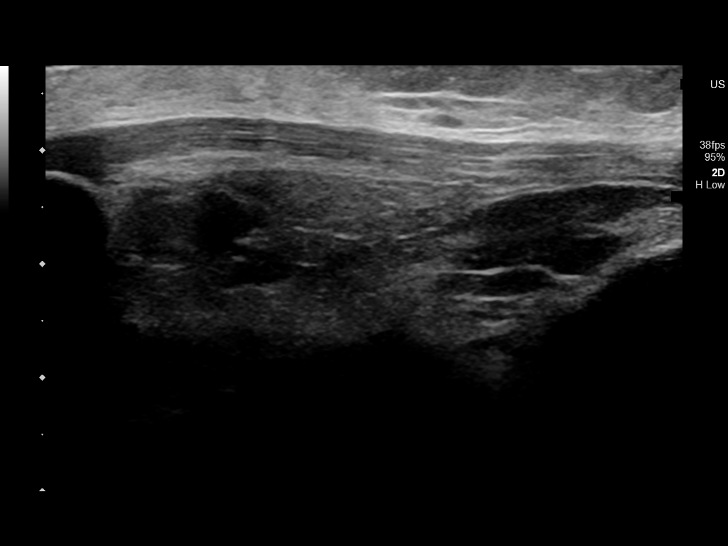
[im 6/20]
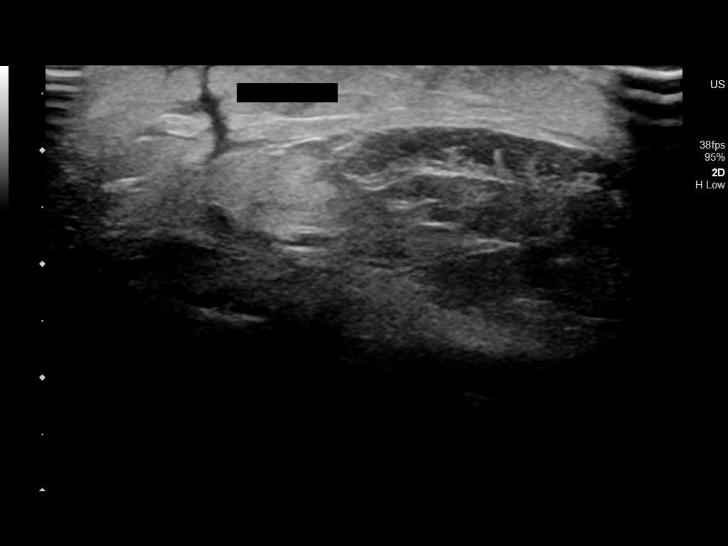
[im 7/20]
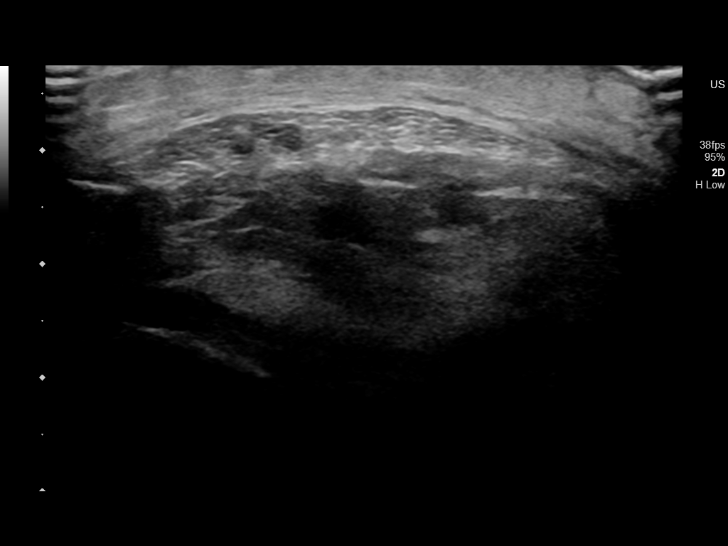
[im 8/20]
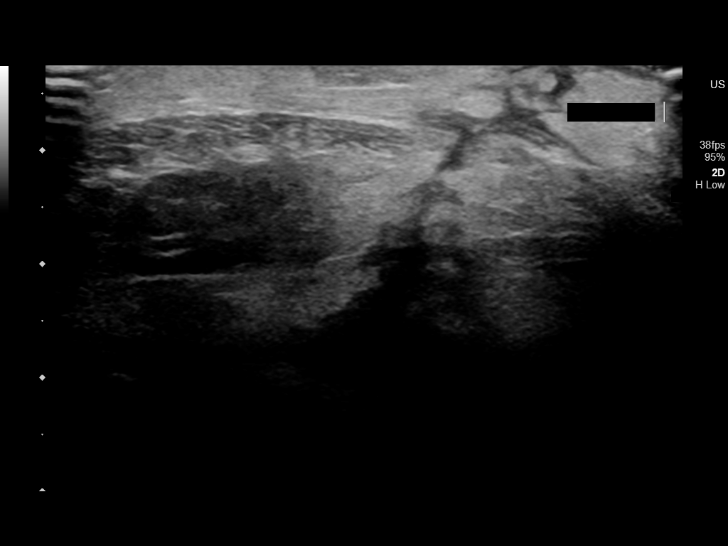
[im 10/20]
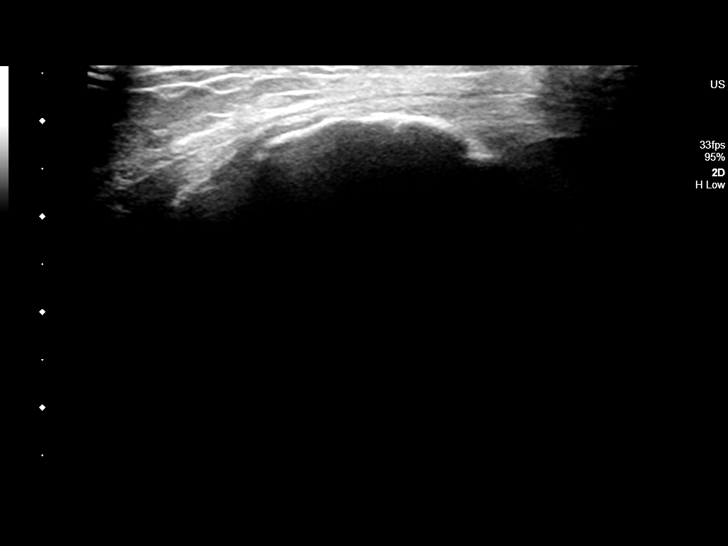
[im 11/20]
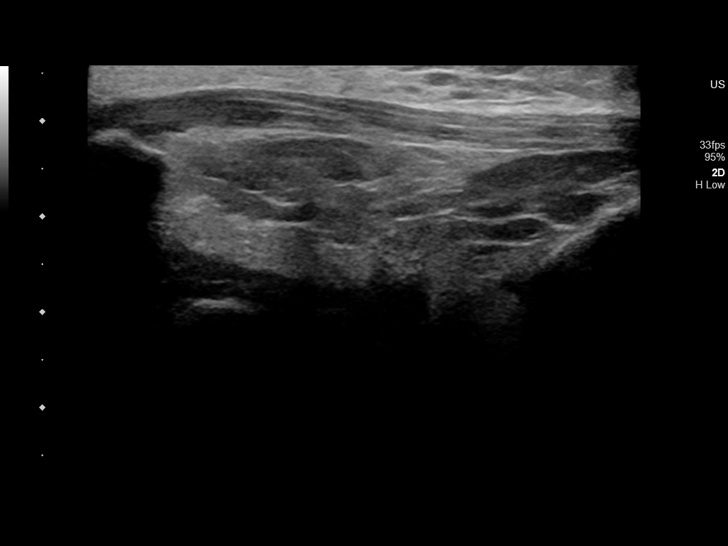
[im 13/20]
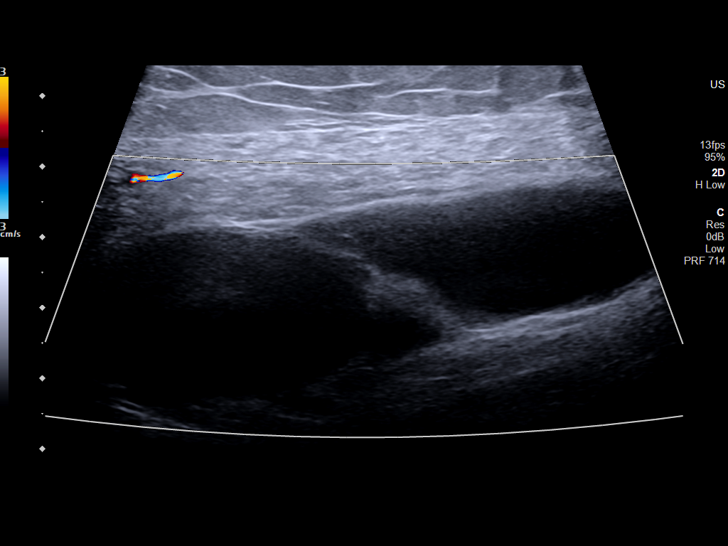
[im 14/20]
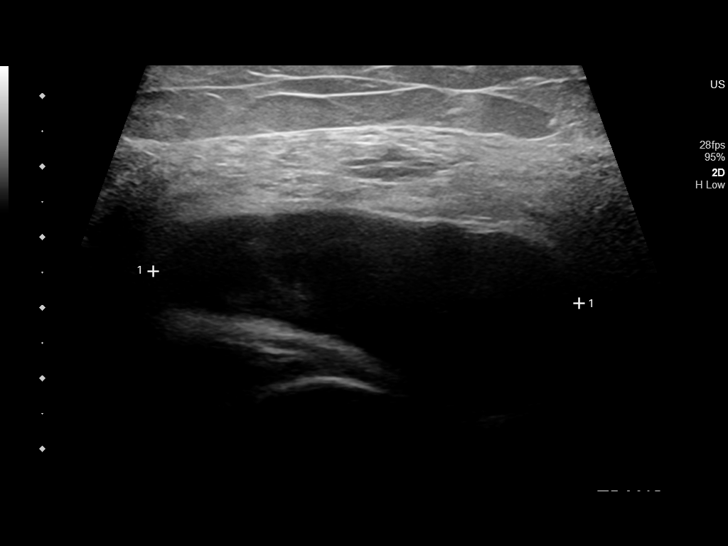
[im 16/20]
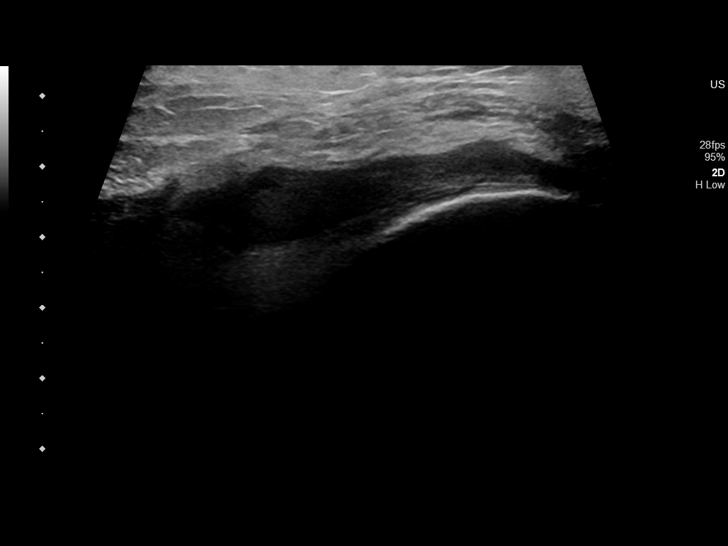
[im 17/20]
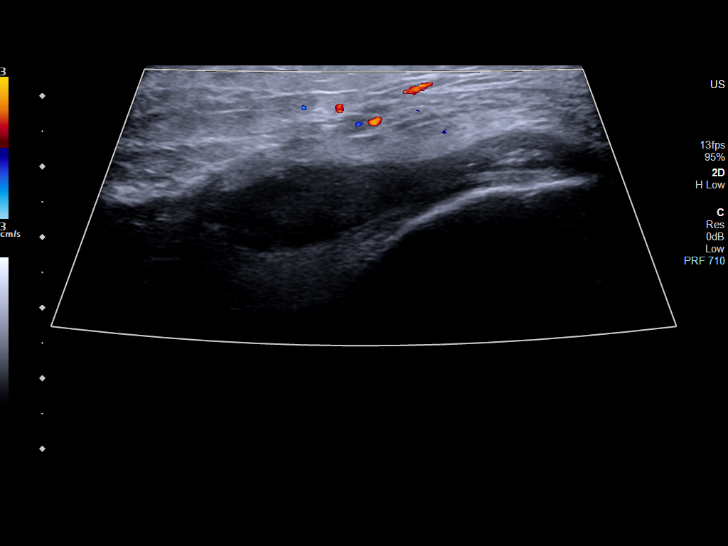
[im 18/20]
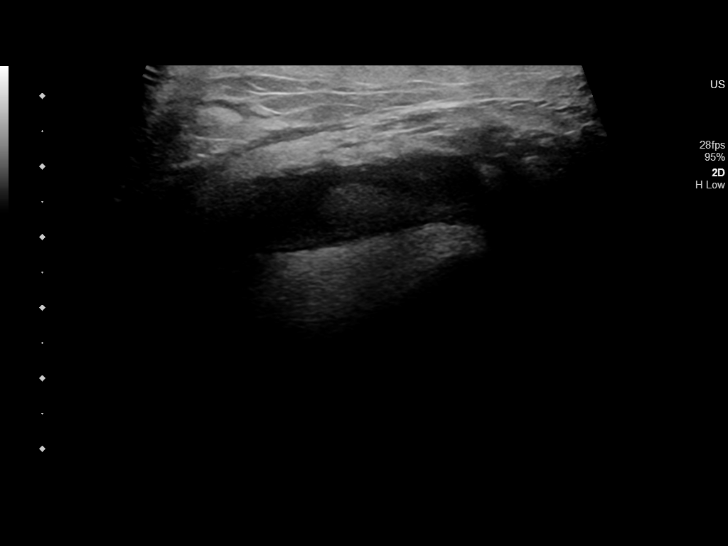
[im 20/20]
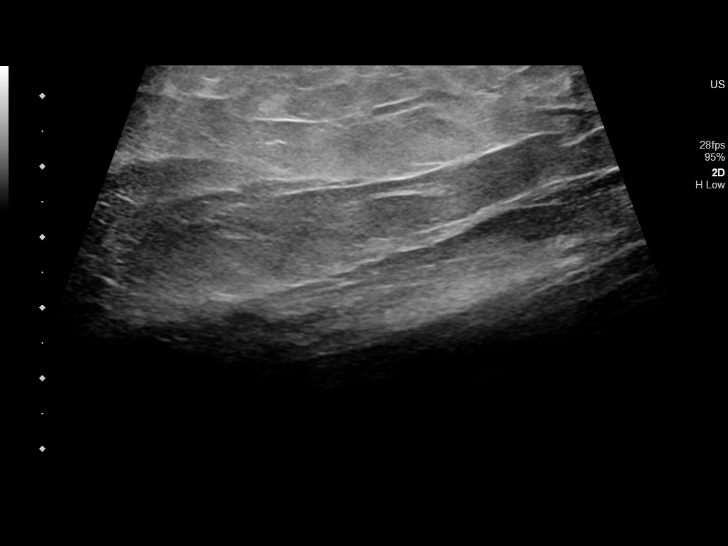

[14 of 20 positions shown; findings below may reference images not displayed]

FINDINGS: Joint Space: In the area of concern adjacent to the patient's
surgical incision of the anterior knee, there is soft tissue edema.
There also is a hypoechoic avascular fluid collection measuring
approximately 11.3 x 2.3 x 6.1 cm medial to the patella.

A smaller fluid collection is seen lateral to the patella. This was
not measured by the sonographer.
IMPRESSION: In the area of interest around the patella, there are fluid
collections adjacent to the medial greater than lateral aspects of
the patella. It is unclear whether this represents fluid extending
from a knee joint effusion versus separate more anterior fluid
collections.

## 2021-08-30 MED ORDER — ADULT MULTIVITAMIN W/MINERALS CH
1.0000 | ORAL_TABLET | Freq: Every day | ORAL | Status: DC
  Administered 2021-08-30 – 2021-09-03 (×5): 1 via ORAL
  Filled 2021-08-30 (×5): qty 1

## 2021-08-30 MED ORDER — PREGABALIN 75 MG PO CAPS
200.0000 mg | ORAL_CAPSULE | Freq: Two times a day (BID) | ORAL | Status: DC
  Administered 2021-08-30 – 2021-09-03 (×8): 200 mg via ORAL
  Filled 2021-08-30 (×8): qty 1

## 2021-08-30 MED ORDER — POTASSIUM CHLORIDE CRYS ER 20 MEQ PO TBCR
20.0000 meq | EXTENDED_RELEASE_TABLET | Freq: Two times a day (BID) | ORAL | Status: DC
  Administered 2021-08-30 – 2021-09-03 (×8): 20 meq via ORAL
  Filled 2021-08-30 (×8): qty 1

## 2021-08-30 MED ORDER — METOPROLOL SUCCINATE ER 100 MG PO TB24
100.0000 mg | ORAL_TABLET | Freq: Every day | ORAL | Status: DC
  Administered 2021-08-30 – 2021-08-31 (×2): 100 mg via ORAL
  Filled 2021-08-30: qty 1
  Filled 2021-08-30: qty 2

## 2021-08-30 MED ORDER — POTASSIUM CHLORIDE 10 MEQ/100ML IV SOLN
10.0000 meq | INTRAVENOUS | Status: AC
  Administered 2021-08-30 (×2): 10 meq via INTRAVENOUS
  Filled 2021-08-30: qty 100

## 2021-08-30 MED ORDER — HYDROCODONE-ACETAMINOPHEN 7.5-325 MG PO TABS
1.0000 | ORAL_TABLET | Freq: Four times a day (QID) | ORAL | Status: DC | PRN
  Administered 2021-08-30 – 2021-09-03 (×9): 1 via ORAL
  Filled 2021-08-30 (×10): qty 1

## 2021-08-30 MED ORDER — FOLIC ACID 1 MG PO TABS
1.0000 mg | ORAL_TABLET | Freq: Every day | ORAL | Status: DC
  Administered 2021-08-30 – 2021-09-03 (×5): 1 mg via ORAL
  Filled 2021-08-30 (×6): qty 1

## 2021-08-30 MED ORDER — DOXAZOSIN MESYLATE 1 MG PO TABS
1.0000 mg | ORAL_TABLET | Freq: Every day | ORAL | Status: DC
  Administered 2021-08-30 – 2021-09-02 (×4): 1 mg via ORAL
  Filled 2021-08-30 (×5): qty 1

## 2021-08-30 MED ORDER — LIDOCAINE 5 % EX PTCH
1.0000 | MEDICATED_PATCH | Freq: Every day | CUTANEOUS | Status: DC | PRN
  Administered 2021-09-01: 1 via TRANSDERMAL
  Filled 2021-08-30 (×2): qty 1

## 2021-08-30 MED ORDER — ALLOPURINOL 300 MG PO TABS
300.0000 mg | ORAL_TABLET | Freq: Every day | ORAL | Status: DC
  Administered 2021-08-30 – 2021-09-01 (×3): 300 mg via ORAL
  Filled 2021-08-30 (×3): qty 1

## 2021-08-30 MED ORDER — MAGNESIUM SULFATE 2 GM/50ML IV SOLN
2.0000 g | Freq: Once | INTRAVENOUS | Status: AC
  Administered 2021-08-30: 2 g via INTRAVENOUS
  Filled 2021-08-30: qty 50

## 2021-08-30 MED ORDER — MELATONIN 5 MG PO TABS
5.0000 mg | ORAL_TABLET | Freq: Every evening | ORAL | Status: DC | PRN
  Administered 2021-08-31: 23:00:00 5 mg via ORAL
  Filled 2021-08-30: qty 1

## 2021-08-30 MED ORDER — MAGNESIUM OXIDE -MG SUPPLEMENT 400 (240 MG) MG PO TABS
400.0000 mg | ORAL_TABLET | Freq: Two times a day (BID) | ORAL | Status: DC
  Administered 2021-08-30 – 2021-09-03 (×8): 400 mg via ORAL
  Filled 2021-08-30 (×8): qty 1

## 2021-08-30 MED ORDER — VITAMIN D 25 MCG (1000 UNIT) PO TABS
1000.0000 [IU] | ORAL_TABLET | Freq: Every day | ORAL | Status: DC
  Administered 2021-08-30 – 2021-09-03 (×5): 1000 [IU] via ORAL
  Filled 2021-08-30 (×5): qty 1

## 2021-08-30 MED ORDER — POTASSIUM CHLORIDE CRYS ER 20 MEQ PO TBCR
40.0000 meq | EXTENDED_RELEASE_TABLET | ORAL | Status: AC
  Administered 2021-08-30 (×2): 40 meq via ORAL
  Filled 2021-08-30 (×2): qty 2

## 2021-08-30 MED ORDER — FUROSEMIDE 40 MG PO TABS
40.0000 mg | ORAL_TABLET | Freq: Every day | ORAL | Status: DC
  Administered 2021-08-30 – 2021-09-02 (×4): 40 mg via ORAL
  Filled 2021-08-30 (×5): qty 1

## 2021-08-30 NOTE — Progress Notes (Signed)
PHARMACY CONSULT NOTE  Pharmacy Consult for Electrolyte Monitoring and Replacement   Recent Labs: Potassium (mmol/L)  Date Value  08/30/2021 3.5   Magnesium (mg/dL)  Date Value  08/30/2021 1.7   Calcium (mg/dL)  Date Value  08/30/2021 9.3   Albumin (g/dL)  Date Value  08/29/2021 2.8 (L)   Phosphorus (mg/dL)  Date Value  08/29/2021 3.7   Sodium (mmol/L)  Date Value  08/30/2021 138   Assessment: Patient is a 31 y/o M with medical history including inflammatory arthritis on methotrexate and chronic steroids, history of tracheostomy, chronic respiratory failure on 2L oxygen at baseline, history of DVT on Xarelto, history of diverticulitis s/p colectomy and ostomy, history of MSSA bacteremia and pneumonia who is admitted with altered mental status. Pharmacy consulted to assist with electrolyte monitoring and replacement as indicated.  Diuresis: PO Lasix 40 mg daily Standing electrolyte replacement: Kcl 20 mEq BID + MagOx 400 mg BID  Goal of Therapy:  Electrolytes within normal limits  Plan:  --PM potassium re-check to 3.5. Started on Kcl 20 mEq BID and MagOx 400 mg BID. Home Lasix re-started as well --Follow-up electrolytes with AM labs tomorrow  Benita Gutter 08/30/2021 4:45 PM

## 2021-08-30 NOTE — Consult Note (Signed)
Rheumatology note: 31 year old white male.  Well-known to me.  Currently off immunosuppression.  Previously on methotrexate and prednisone.  Right knee still painful.  Recent septic arthritis.  Most of his pain seems to be anteriorly by his report.  May hurt even to touch it or to bend it.  Had drainage x2.  Aspiration after that Admitted with some mental status changes now improved Off prednisone for 1 month.  Had recent low-grade fever.  Remains on oral antibiotics. Still Has left foot drop and significant decrease in right shoulder abduction as well as left hand neuropathy.  Those are about the same.  Has had some edema in the legs.  Getting diuretic.  Has not had any acute swelling of any joint  Exam: Puffy left hand.  Right hand without synovitis.  Decreased range of motion of both shoulders.  Right knee in flexion.  Is tender to touch even with soft touch below the patella.  Cannot discern any free fluid.  Left knee without definite free fluid.  Bilateral lower extremity edema  Impressions:  Persistent right knee pain.  Particularly infra patella pain to touch.  Cannot rule out small effusion or fluid pocket   Significant right knee arthritis status post septic knee and bacteremia.  Recommendation:  Do not want to add any immunosuppression or steroids Discussed with hospitalist.  Best to image the knee to make sure there is no fluid collection.  He has difficulty with MRI.   Consider ultrasound musculoskeletal and if any fluid collection seen , then possible recurrent drainage.  Antibiotics per infectious disease  Orthopedic opinion

## 2021-08-30 NOTE — Progress Notes (Signed)
Bipap remains on standby. Patient tolerating nasal cannula well.

## 2021-08-30 NOTE — Progress Notes (Signed)
[facility] [place] NAME: Craig [name]    MR#:  [id number]  DATE OF BIRTH:  [birth year]  SUBJECTIVE:  mother at bedside. Patient seen along with Dr. Precious Reel. Overall breathing much improved. Cannot lay flat. Has had several rounds of IV and oral potassium. Right knee pain present. Worked with physical therapy earlier. No fever.  No fever. Patient remains bedbound secondary to his right knee infection. REVIEW OF SYSTEMS:   Review of Systems  Constitutional:  Negative for chills, fever and weight loss.  HENT:  Negative for ear discharge, ear pain and nosebleeds.   Eyes:  Negative for blurred vision, pain and discharge.  Respiratory:  Positive for shortness of breath. Negative for sputum production, wheezing and stridor.   Cardiovascular:  Negative for chest pain, palpitations, orthopnea and PND.  Gastrointestinal:  Negative for abdominal pain, diarrhea, nausea and vomiting.  Genitourinary:  Negative for frequency and urgency.  Musculoskeletal:  Negative for back pain and joint pain.  Neurological:  Positive for weakness. Negative for sensory change, speech change and focal weakness.  Psychiatric/Behavioral:  Negative for depression and hallucinations. The patient is not nervous/anxious.   Tolerating Diet:yes Tolerating PT: home health PT OT  DRUG ALLERGIES:   Allergies  Allergen Reactions   Tramadol Hcl     SEIZURES!!! DO NOT REORDER AT DISCHARGE!!!   Fentanyl Citrate Other (See Comments)    Makes patient more agitated & delirious   Amoxicillin Rash    Tolerated cefepime and cefazolin 08/2020. Tolerated Zosyn 07/2021    VITALS:  Blood pressure (!) 105/54, pulse (!) 101, temperature 98.1 F (36.7 C), temperature source Oral, resp. rate (!) 23, SpO2 97 %.  PHYSICAL EXAMINATION:   Physical Exam  GENERAL:  32 y.o.-year-old patient lying in the bed with no acute distress. Morbidly obese HEENT: Head atraumatic,  normocephalic. Oropharynx and nasopharynx clear. [place]+ LUNGS: Normal breath sounds bilaterally, no wheezing, rales, rhonchi. No use of accessory muscles of respiration.  CARDIOVASCULAR: S1, S2 normal. No murmurs, rubs, or gallops.  ABDOMEN: Soft, nontender, nondistended. Midline abdominal scar. Left lower quadrant colostomy present  EXTREMITIES: right knee decreased range of motion secondary to severe DJD. Tenderness over the right knee NEUROLOGIC: nonfocal PSYCHIATRIC:  patient is alert and oriented x 3.  SKIN:  Pressure Injury 07/08/21 Scrotum Bilateral Stage 1 -  Intact skin with non-blanchable redness of a localized area usually over a bony prominence. red nonblanchable on sacrum, above sacrum and scrotal sack (Active)  07/08/21   Location: Scrotum  Location Orientation: Bilateral  Staging: Stage 1 -  Intact skin with non-blanchable redness of a localized area usually over a bony prominence.  Wound Description (Comments): red nonblanchable on sacrum, above sacrum and scrotal sack  Present on Admission: Yes        LABORATORY PANEL:  CBC Recent Labs  Lab 08/29/21 0425  WBC 9.2  HGB 9.9*  HCT 34.2*  PLT 270     Chemistries  Recent Labs  Lab 08/29/21 0425 08/30/21 0047 08/30/21 0445 08/30/21 1548  NA 136   < > 138  --   K 2.8*   < > 2.9* 3.5  CL 92*   < > 95*  --   CO2 35*   < > 35*  --   GLUCOSE 143*   < > 101*  --   BUN <5*   < > <5*  --   CREATININE 0.54*   < > 0.46*  --  CALCIUM 9.0   < > 9.3  --   MG 1.9  --  1.7  --   AST 30  --   --   --   ALT 11  --   --   --   ALKPHOS 58  --   --   --   BILITOT 0.6  --   --   --    < > = values in this interval not displayed.    Cardiac Enzymes No results for input(s): TROPONINI in the last 168 hours. RADIOLOGY:  DG Chest 1 View  Result Date: 08/28/2021 CLINICAL DATA:  Suspected Sepsis EXAM: CHEST  1 VIEW COMPARISON:  Chest x-ray 08/15/2021, CT chest 08/10/2021, chest x-ray 07/13/2021 FINDINGS: The heart and  mediastinal contours are unchanged. Persistent elevation of the right hemidiaphragm. No focal consolidation. Similar-appearing increased interstitial markings. No pleural effusion. No pneumothorax. No acute osseous abnormality. IMPRESSION: Similar-appearing increased interstitial markings. Electronically Signed   By: Iven Finn M.D.   On: 08/28/2021 19:00   DG Knee 3 Views Right  Result Date: 08/28/2021 CLINICAL DATA:  Fever, right knee pain and swelling EXAM: RIGHT KNEE - 3 VIEW COMPARISON:  Knee x-ray report dated 07/03/2021. No images were available for comparison FINDINGS: Advanced tibiofemoral joint space loss with erosive changes along the medial femoral condyle and medial tibial plateau. More subtle erosive changes involving the central aspect of the lateral femoral condyle. Small joint effusion. Bones appear demineralized. Generalized soft tissue swelling. IMPRESSION: Advanced erosive changes of the right knee. Findings highly suggestive of septic arthritis. Similar findings were reported on previous radiographs dated 07/03/2021, although the images were not immediately available for comparison. Electronically Signed   By: Davina Poke D.O.   On: 08/28/2021 18:57   ASSESSMENT AND PLAN:  Craig [name] is a 31 y.o. 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   Acute on chronic hypoxic/hypercapnia respiratory failure present on admission acute metabolic encephalopathy-- resolved -- came in with ABG with Paco2 of 79 --  currently is on BiPAP. He is alert and oriented. -- Patient uses [facility]. He also uses chronic home oxygen. -- He follows with pulmonary Dr.Aleskerov -- continue to monitor his mentation. -- PRN bronchodilators  history of MSSA right knee septic arthritis -- patient is bedbound secondary to unable to bear weight on right knee -- follows with Dr. Rudene Christians -- continue overall Cefradoxil for total 14 days. Patient follows with ID Dr. [name] --1/11-- antibiotics change to Keflex 1 g PO Q6 for one month per Dr. [name] -- will get ultrasound soft tissue right knee per Dr. Scharlene Gloss recommendation -- consider orthopedic consultation with Dr. Rudene Christians  history of upper extremity DVT -- continue Xarelto -- recent CT chest was negative for PE  Hypokalemia/hypomagnesimia -- pharmacy to replete -- start on PO 20 M EQ BID along with oral magnesium  type II diabetes, morbid obesity -- hemoglobin 6.8 in November 2022 -- diet control at home  morbid obesity -- complicates prognosis  will have physical therapy see patient.-- Recommends home health TOC for discharge planning.  Procedures: Family communication : mother in the ER,  wife on the phone Consults : ID, rheumatology CODE STATUS: full DVT Prophylaxis : Xarelto Level of care: Telemetry Cardiac Status is: Inpatient  Remains inpatient appropriate because: acute on chronic hypoxic hypercapnia respiratory failure. Anticipate discharge 1 to 2 days if continues to show improvement.        TOTAL TIME TAKING CARE OF THIS PATIENT: 30 minutes.  >50% time spent on counselling and coordination of care  Note: This dictation was prepared with Dragon dictation along with smaller phrase technology. Any transcriptional errors that result from this process are unintentional.  Fritzi Mandes M.D    [facility]   CC: Primary care physician; [name], MD Patient ID: KERRION KEMPPAINEN, male   DOB: 1991/03/12, 31  y.o.   MRN: [id number]

## 2021-08-30 NOTE — Progress Notes (Addendum)
PHARMACY CONSULT NOTE  Pharmacy Consult for Electrolyte Monitoring and Replacement   Recent Labs: Potassium (mmol/L)  Date Value  08/30/2021 2.9 (L)   Magnesium (mg/dL)  Date Value  08/29/2021 1.9   Calcium (mg/dL)  Date Value  08/30/2021 9.3   Albumin (g/dL)  Date Value  08/29/2021 2.8 (L)   Phosphorus (mg/dL)  Date Value  08/29/2021 3.7   Sodium (mmol/L)  Date Value  08/30/2021 138   Assessment: Patient is a 30 y/o M with medical history including inflammatory arthritis on methotrexate and chronic steroids, history of tracheostomy, chronic respiratory failure on 2L oxygen at baseline, history of DVT on Xarelto, history of diverticulitis s/p colectomy and ostomy, history of MSSA bacteremia and pneumonia who is admitted with altered mental status. Pharmacy consulted to assist with electrolyte monitoring and replacement as indicated.  Goal of Therapy:  Electrolytes within normal limits  Plan:  --K 2.9 today. Essentially un-changed after 80 mEq K+ replacement yesterday. Will order Kcl 40 mEq every 4 hours x 2 doses + IV Kcl 10 mEq x 2 runs --Mg 1.7. Will give IV magnesium sulfate 2 g x 1 --Re-check electrolytes with AM labs tomorrow  Craig [name] 08/30/2021 10:03 AM

## 2021-08-30 NOTE — TOC Initial Note (Signed)
Transition of Care [facility]) - Initial/Assessment Note    Patient Details  Name: Craig [name] MRN: [id number] Date of Birth: March 23, 1991  [facility]) CM/SW Contact:    Shelbie Hutching, RN Phone Number: 08/30/2021, 4:16 PM  Clinical Narrative:                 Patient admitted to the hospital with hypokalemia and sepsis.  Patient recently discharged from the hospital on 08/23/21.  Patient is on chronic O2 at home and is bed bound getting home health therapy through [facility].  Patient was set up with a [facility] this past admission and his wife reports that this has been going well he uses is when he sleeps and any other time he feels like he needs it.  Craig [name], wife, would like to find a PCP that can do house calls.  Referral faxed to [place] Medicaid is in network.  [facility] can provide services for patient at home.   [facility] provides home care services for chronic and complex medical conditions and wife expressed interest in their services but unfortunately they are not in network with [facility] [employer] at this time.   OP palliative referral was given to [facility] last admission.   TOC to cont to follow.    Expected Discharge Plan: [place] Barriers to Discharge: Continued Medical Work up   Patient Goals and CMS Choice Patient states their goals for this hospitalization and ongoing recovery are:: to be able to get back home CMS Medicare.gov Compare Post Acute Care list provided to:: Patient Choice offered to / list presented to : Patient, Spouse  Expected Discharge Plan and Services Expected Discharge Plan: [place]     Post Acute Care Choice: Home Health, Resumption of Svcs/PTA Provider Living arrangements for the past 2 months: Single Family Home                 DME Arranged: N/A DME Agency: NA       HH Arranged: PT, OT HH Agency: [place] Date [place]:  08/30/21 Time [place]: 42 Representative spoke with at [place]: [place] Arrangements/Services Living arrangements for the past 2 months: [place] Lives with:: Spouse Patient language and need for interpreter reviewed:: Yes Do you feel safe going back to the place where you live?: Yes      Need for Family Participation in Patient Care: Yes (Comment) Care giver support system in place?: Yes (comment) Current home services: Home OT, Home PT, Home RN, DME (NIV, oxygen) Criminal Activity/Legal Involvement Pertinent to Current Situation/Hospitalization: No - Comment as needed  Activities of Daily Living      Permission Sought/Granted Permission sought to share information with : Case Manager, Family Supports, Other (comment) Permission granted to share information with : Yes, Verbal Permission Granted  Share Information with NAME: Craig [name]  Permission granted to share info w AGENCY: [facility], [facility], [place] granted to share info w Relationship: spouse  Permission granted to share info w Contact Information: [phone]  Emotional Assessment Appearance:: Appears stated age Attitude/Demeanor/Rapport: Engaged Affect (typically observed): Accepting Orientation: : Oriented to Self, Oriented to Place, Oriented to  Time, Oriented to Situation Alcohol / Substance Use: Not Applicable Psych Involvement: No (comment)  Admission diagnosis:  AMS (altered mental status) [R41.82] Acute on chronic respiratory failure with hypoxemia ([place]) [J96.21] Patient Active Problem  List   Diagnosis Date Noted   Electrolyte abnormality 08/29/2021   DM II (diabetes mellitus, type II), controlled ([place]) 08/29/2021   Chronic pain of right knee    Hypokalemia    AMS (altered mental status) 08/28/2021   Acute on chronic respiratory failure with hypoxia and hypercapnia ([place]) 08/10/2021   Acute on chronic respiratory failure with hypoxemia ([place])  07/08/2021   Neuropathic pain, arm 10/20/2020   Pressure injury of skin 09/18/2020   Staphylococcus aureus bacteremia    Fever    Metabolic encephalopathy    Acute respiratory failure with hypoxia (HCC)    Acute hypoxemic respiratory failure due to COVID-19 ([place]) 08/09/2020   Severe sepsis ([place]) 08/09/2020   AKI (acute kidney injury) ([place]) 08/09/2020   Hypertension    Leak of anastomosis between gastrointestinal structures 07/08/2020   Pneumoperitoneum 07/08/2020   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 [address] Phone: [phone] Fax: [phone]  [id number] [place] [place] - [address] [place] [address] Phone: [phone] Fax: [phone]     Social Determinants of Health (SDOH) Interventions    Readmission Risk Interventions Readmission Risk Prevention Plan 08/30/2021 07/10/2021  Transportation Screening Complete Complete  PCP or Specialist Appt within 3-5 Days - Complete  HRI or [place] - Complete  Social Work Consult for [place] Planning/Counseling - Complete  Palliative Care Screening - Not Applicable  Medication Review Press [employer]) Complete Complete  PCP or Specialist appointment within 3-5 days of discharge Complete -  [place] or Home Care Consult Complete -  SW Recovery Care/Counseling Consult Complete -  Palliative Care Screening Not Applicable -  [place] Not Applicable -  Some recent data might be hidden

## 2021-08-30 NOTE — Evaluation (Signed)
Physical Therapy Evaluation Patient Details Name: Craig [name] MRN: [id number] DOB: [birth year] Today's Date: 08/30/2021  History of Present Illness  31 y/o M with medical history including inflammatory arthritis on methotrexate and chronic steroids, history of tracheostomy, chronic respiratory failure on 2L oxygen at baseline, history of DVT on Xarelto, history of diverticulitis s/p colectomy and ostomy, history of MSSA bacteremia and pneumonia who is admitted with altered mental status   Clinical Impression  Patient alert, agreeable to PT/OT co treat to maximize function and safety. The patient reported at baseline he has had a decrease in function due to bilateral shoulder pain, and ongoing R knee pain. Spends his day in his lift recliner, family provides assist for all ADLs. Pt able to stand with lift chair and family places [facility] behind him.     Supine to sit with maxAx2. Reliant on PT assist for LE assistance and reliant on trunk assist as well. Pt able to spend a few minutes in sitting at EOB, and maintained static sitting ~10seconds with supervision. Otherwise required at least mod-maxA to maintain seated balance. Pt limited by fatigue and anxiety, especially in regards to SOB. The patient demonstrated decreased function, mobility, and independence, and owuld benefit from further skilled PT intervention. Recommendation at this time is HHPT with frequent/constant supervision and assistance.      Recommendations for follow up therapy are one component of a multi-disciplinary discharge planning process, led by the attending physician.  Recommendations may be updated based on patient status, additional functional criteria and insurance authorization.  Follow Up Recommendations Home health PT    Assistance Recommended at Discharge Frequent or constant Supervision/Assistance  Patient can return home with the following  Two people to help with walking and/or transfers;A lot of help with  bathing/dressing/bathroom;Direct supervision/assist for medications management;Direct supervision/assist for financial management;Help with stairs or ramp for entrance;Assist for transportation;Assistance with feeding;Assistance with cooking/housework;Two people to help with bathing/dressing/bathroom    Equipment Recommendations None recommended by PT  Recommendations for Other Services       Functional Status Assessment Patient has had a recent decline in their functional status and demonstrates the ability to make significant improvements in function in a reasonable and predictable amount of time.     Precautions / Restrictions Precautions Precautions: Fall Precaution Comments: ostomy on left Restrictions Weight Bearing Restrictions: Yes RLE Weight Bearing: Weight bearing as tolerated      Mobility  Bed Mobility Overal bed mobility: Needs Assistance Bed Mobility: Supine to Sit;Sit to Supine     Supine to sit: Max assist;+2 for physical assistance Sit to supine: Max assist;+2 for physical assistance   General bed mobility comments: assistance for trunk and BLE support. verbal cues for task initiation and sequencing. increased time and effort required during activity    Transfers                   General transfer comment: not attempted secondary to safety and poor sitting tolerance with generalized weakness    Ambulation/Gait                  Stairs            Wheelchair Mobility    Modified Rankin (Stroke Patients Only)       Balance Overall balance assessment: Needs assistance Sitting-balance support: Single extremity supported;No upper extremity supported Sitting balance-Leahy Scale: Poor Sitting balance - Comments: Pt maintains static sitting balance for 10 seconds and otherwise leaning back against therapist  Standing balance comment: unable/unsafe to attempt this date                             Pertinent  Vitals/Pain Pain Assessment: Faces Faces Pain Scale: Hurts even more Pain Location: right knee, left shoulder, generalized pain in most joints with range of motion Pain Descriptors / Indicators: Sore;Discomfort;Grimacing Pain Intervention(s): Limited activity within patient's tolerance;Monitored during session;Repositioned    Home Living Family/patient expects to be discharged to:: Private residence Living Arrangements: Spouse/significant other;Children;Parent Available Help at Discharge: Family;Available 24 hours/day Type of Home: House Home Access: Ramped entrance       Home Layout: Multi-level;Able to live on main level with bedroom/bathroom Home Equipment: Wheelchair - manual;Rollator (4 wheels);Hospital bed Additional Comments: now has a hoyer lift and tempurpedic mattress. Pt reports he sleeps in lift chair and lifts chair all the way up and stands with +2 assistance.    Prior Function               Mobility Comments: Pt reports he was ambulating with RW June 2022. He sleeps in lift chair because he can't tolerate lying flat. +2 assist to stand from lift chair. He does not pivot for transfer. Family pushes wheelchair behind him and he sits in it. ADLs Comments: Pt reports that he was able to feed himself, scratch face, and engage in some UB self care frior to Nov 2022 but now needs assist from all aspects of care.     Hand Dominance   Dominant Hand: Right    Extremity/Trunk Assessment   Upper Extremity Assessment Upper Extremity Assessment: Defer to OT evaluation RUE Deficits / Details: very limited shoulder flexion against gravity with 3/5 distal movements RUE: Unable to fully assess due to pain;Shoulder pain with ROM LUE Deficits / Details: Unable to flex shoulder > 30degrees. Strength at  3/5 in all distal movements LUE: Shoulder pain with ROM    Lower Extremity Assessment Lower Extremity Assessment: Generalized weakness (unable to bend R knee without assistance,  able to move LLE against gravity)       Communication   Communication: No difficulties  Cognition Arousal/Alertness: Awake/alert Behavior During Therapy: Anxious Overall Cognitive Status: Within Functional Limits for tasks assessed                                 General Comments: Pt follows commands with increased time and is pleasant and cooperative throughout. Anxious about movement and feeling SOB.        General Comments      Exercises     Assessment/Plan    PT Assessment Patient needs continued PT services  PT Problem List Decreased strength;Decreased mobility;Obesity;Decreased range of motion;Decreased activity tolerance;Cardiopulmonary status limiting activity;Decreased skin integrity;Decreased knowledge of use of DME;Decreased balance       PT Treatment Interventions DME instruction;Therapeutic activities;Modalities;Gait training;Therapeutic exercise;Patient/family education;Stair training;Balance training;Functional mobility training;Manual techniques;Neuromuscular re-education;Wheelchair mobility training    PT Goals (Current goals can be found in the Care Plan section)  Acute Rehab PT Goals Patient Stated Goal: to go home PT Goal Formulation: With patient Time For Goal Achievement: 09/13/21 Potential to Achieve Goals: Fair    Frequency Min 2X/week     Co-evaluation PT/OT/SLP Co-Evaluation/Treatment: Yes Reason for Co-Treatment: Complexity of the patient's impairments (multi-system involvement);For patient/therapist safety;To address functional/ADL transfers PT goals addressed during session: Mobility/safety with mobility;Balance OT goals addressed during session:  ADL's and self-care       AM-PAC PT "6 Clicks" Mobility  Outcome Measure Help needed turning from your back to your side while in a flat bed without using bedrails?: Total Help needed moving from lying on your back to sitting on the side of a flat bed without using bedrails?:  Total Help needed moving to and from a bed to a chair (including a wheelchair)?: Total Help needed standing up from a chair using your arms (e.g., wheelchair or bedside chair)?: Total Help needed to walk in hospital room?: Total Help needed climbing 3-5 steps with a railing? : Total 6 Click Score: 6    End of Session Equipment Utilized During Treatment: Oxygen Activity Tolerance: Patient limited by fatigue;Patient limited by pain Patient left: in bed;with call bell/phone within reach;with bed alarm set;with family/visitor present Nurse Communication: Mobility status PT Visit Diagnosis: Unsteadiness on feet (R26.81);Muscle weakness (generalized) (M62.81);Difficulty in walking, not elsewhere classified (R26.2) Pain - Right/Left: Right Pain - part of body: Knee    Time: 8657-8469 PT Time Calculation (min) (ACUTE ONLY): 28 min   Charges:   PT Evaluation $PT Eval Moderate Complexity: 1 Mod PT Treatments $Therapeutic Activity: 8-22 mins        Lieutenant Diego PT, DPT 4:50 PM,08/30/21

## 2021-08-30 NOTE — Evaluation (Signed)
Occupational Therapy Evaluation Patient Details Name: Craig [name] MRN: [id number] DOB: April 26, 1991 Today's Date: 08/30/2021   History of Present Illness 31 y/o M with medical history including inflammatory arthritis on methotrexate and chronic steroids, history of tracheostomy, chronic respiratory failure on 2L oxygen at baseline, history of DVT on Xarelto, history of diverticulitis s/p colectomy and ostomy, history of MSSA bacteremia and pneumonia who is admitted with altered mental status   Clinical Impression   Patient presenting with decreased Ind in self care, balance, functional mobility/transfers, endurance, and safety awareness. Patient and mother present in room. Pt last ambulated ~ 7 months ago. He now stands from lift chair with max +2 assistance and they can push wheelchair under him. He does not pivot or transfer. Pt reports he was able to perform some self care tasks and feed self prior to Nov admission but has not been able to since that time. Pt could be at new baseline. Pt requires +2 max A to EOB during skilled co-evaluation with PT. Pt able to tolerate sitting without heavy assist for 10 seconds before leaning back towards therapist. Pt becomes very anxious with movement but O2 saturation remains in 90's but RR increased to 30's. Total A of 2 for repositioning in bed and all needs within reach. Pt's mother remains present in room. Patient will benefit from acute OT to increase overall independence in the areas of ADLs, functional mobility, and safety awareness in order to safely discharge home with family.      Recommendations for follow up therapy are one component of a multi-disciplinary discharge planning process, led by the attending physician.  Recommendations may be updated based on patient status, additional functional criteria and insurance authorization.   Follow Up Recommendations  Home health OT    Assistance Recommended at Discharge Frequent or constant  Supervision/Assistance  Patient can return home with the following Two people to help with walking and/or transfers;Two people to help with bathing/dressing/bathroom    Functional Status Assessment  Patient has had a recent decline in their functional status and/or demonstrates limited ability to make significant improvements in function in a reasonable and predictable amount of time  Equipment Recommendations  None recommended by OT (Pt has all needed equipment)       Precautions / Restrictions Precautions Precautions: Fall Precaution Comments: ostomy on left      Mobility Bed Mobility Overal bed mobility: Needs Assistance Bed Mobility: Supine to Sit;Sit to Supine     Supine to sit: Max assist;+2 for physical assistance Sit to supine: Max assist;+2 for physical assistance   General bed mobility comments: assistance for trunk and BLE support. verbal cues for task initiation and sequencing. increased time and effort required during activity    Transfers                   General transfer comment: not attempted secondary to safety and poor sitting tolerance with generalized weakness      Balance Overall balance assessment: Needs assistance   Sitting balance-Leahy Scale: Poor Sitting balance - Comments: Pt maintains static sitting balance for 10 seconds and otherwise leaning back against therapist       Standing balance comment: unable/unsafe to attempt this date                           ADL either performed or assessed with clinical judgement   ADL  General ADL Comments: total A -max A of 2 for all aspects of self care.     Vision Patient Visual Report: No change from baseline              Pertinent Vitals/Pain Pain Assessment: Faces Faces Pain Scale: Hurts even more Pain Location: right knee, left shoulder, generalized pain in most joints with range of motion Pain Descriptors /  Indicators: Sore;Discomfort;Grimacing Pain Intervention(s): Limited activity within patient's tolerance;Monitored during session;Repositioned     Hand Dominance Right   Extremity/Trunk Assessment Upper Extremity Assessment Upper Extremity Assessment: RUE deficits/detail;LUE deficits/detail RUE Deficits / Details: very limited shoulder flexion against gravity with 3/5 distal movements RUE: Unable to fully assess due to pain;Shoulder pain with ROM LUE Deficits / Details: Unable to flex shoulder > 30degrees. Strength at  3/5 in all distal movements LUE: Shoulder pain with ROM   Lower Extremity Assessment Lower Extremity Assessment: Defer to PT evaluation       Communication Communication Communication: No difficulties   Cognition Arousal/Alertness: Awake/alert Behavior During Therapy: Anxious Overall Cognitive Status: Within Functional Limits for tasks assessed                                 General Comments: Pt follows commands with increased time and is pleasant and cooperative throughout. Anxious about movement and feeling SOB.                Home Living Family/patient expects to be discharged to:: Private residence Living Arrangements: Spouse/significant other;Children;Parent Available Help at Discharge: Family;Available 24 hours/day Type of Home: House Home Access: Ramped entrance     Home Layout: Multi-level;Able to live on main level with bedroom/bathroom     Bathroom Shower/Tub: Walk-in shower;Other (comment) (Pt has reported that family washes him outside with water hose or baths in lift chair)         Home Equipment: Wheelchair - manual;Rollator (4 wheels);Hospital bed   Additional Comments: now has a hoyer lift and tempurpedic mattress. Pt reports he sleeps in lift chair and lifts chair all the way up and stands with +2 assistance.      Prior Functioning/Environment               Mobility Comments: Pt reports he was ambulating with  RW June 2022. He sleeps in lift chair because he can't tolerate lying flat. +2 assist to stand from lift chair. He does not pivot for transfer. Family pushes wheelchair behind him and he sits in it. ADLs Comments: Pt reports that he was able to feed himself, scratch face, and engage in some UB self care frior to Nov 2022 but now needs assist from all aspects of care.        OT Problem List: Decreased strength;Decreased activity tolerance;Impaired balance (sitting and/or standing);Decreased safety awareness;Decreased range of motion      OT Treatment/Interventions: Self-care/ADL training;Therapeutic exercise;Therapeutic activities;Energy conservation;Patient/family education;DME and/or AE instruction;Balance training    OT Goals(Current goals can be found in the care plan section) Acute Rehab OT Goals Patient Stated Goal: to go home OT Goal Formulation: With patient/family Time For Goal Achievement: 09/13/21 Potential to Achieve Goals: Fair ADL Goals Pt Will Perform Grooming: with max assist Pt Will Perform Upper Body Bathing: with max assist Pt/caregiver will Perform Home Exercise Program: Increased strength;Both right and left upper extremity;With written HEP provided;With Supervision  OT Frequency: Min 2X/week    Co-evaluation PT/OT/SLP Co-Evaluation/Treatment: Yes Reason for  Co-Treatment: Complexity of the patient's impairments (multi-system involvement);For patient/therapist safety;To address functional/ADL transfers   OT goals addressed during session: ADL's and self-care      AM-PAC OT "6 Clicks" Daily Activity     Outcome Measure Help from another person eating meals?: Total Help from another person taking care of personal grooming?: Total Help from another person toileting, which includes using toliet, bedpan, or urinal?: Total Help from another person bathing (including washing, rinsing, drying)?: Total Help from another person to put on and taking off regular upper body  clothing?: Total Help from another person to put on and taking off regular lower body clothing?: Total 6 Click Score: 6   End of Session Equipment Utilized During Treatment: Oxygen Nurse Communication: Mobility status  Activity Tolerance: Patient tolerated treatment well Patient left: in bed;with call bell/phone within reach;with bed alarm set;with family/visitor present  OT Visit Diagnosis: Unsteadiness on feet (R26.81);Repeated falls (R29.6);Muscle weakness (generalized) (M62.81)                Time: 3244-0102 OT Time Calculation (min): 28 min Charges:  OT General Charges $OT Visit: 1 Visit OT Evaluation $OT Eval Moderate Complexity: 1 [address] [place], OTR/L , [facility]IS ascom [phone]  08/30/21, 3:43 PM

## 2021-08-30 NOTE — Plan of Care (Signed)

## 2021-08-31 ENCOUNTER — Inpatient Hospital Stay: Payer: Medicaid Other

## 2021-08-31 DIAGNOSIS — M1711 Unilateral primary osteoarthritis, right knee: Secondary | ICD-10-CM | POA: Diagnosis not present

## 2021-08-31 DIAGNOSIS — M00061 Staphylococcal arthritis, right knee: Secondary | ICD-10-CM | POA: Diagnosis not present

## 2021-08-31 LAB — SYNOVIAL CELL COUNT + DIFF, W/ CRYSTALS
Crystals, Fluid: NONE SEEN
Eosinophils-Synovial: 0 %
Lymphocytes-Synovial Fld: 2 %
Monocyte-Macrophage-Synovial Fluid: 7 %
Neutrophil, Synovial: 91 %
WBC, Synovial: 36754 /mm3 — ABNORMAL HIGH (ref 0–200)

## 2021-08-31 LAB — GLUCOSE, CAPILLARY
Glucose-Capillary: 169 mg/dL — ABNORMAL HIGH (ref 70–99)
Glucose-Capillary: 127 mg/dL — ABNORMAL HIGH (ref 70–99)
Glucose-Capillary: 187 mg/dL — ABNORMAL HIGH (ref 70–99)
Glucose-Capillary: 170 mg/dL — ABNORMAL HIGH (ref 70–99)
Glucose-Capillary: 160 mg/dL — ABNORMAL HIGH (ref 70–99)

## 2021-08-31 LAB — BASIC METABOLIC PANEL
Anion gap: 9 (ref 5–15)
BUN: <5 mg/dL — ABNORMAL LOW (ref 6–20)
CO2: 35 mmol/L — ABNORMAL HIGH (ref 22–32)
Calcium: 9.6 mg/dL (ref 8.9–10.3)
Chloride: 94 mmol/L — ABNORMAL LOW (ref 98–111)
Creatinine, Ser: 0.34 mg/dL — ABNORMAL LOW (ref 0.61–1.24)
GFR, Estimated: >60 mL/min (ref >60)
Glucose, Bld: 98 mg/dL (ref 70–99)
Potassium: 3.5 mmol/L (ref 3.5–5.1)
Sodium: 138 mmol/L (ref 135–145)

## 2021-08-31 LAB — MAGNESIUM: Magnesium: 1.6 mg/dL — ABNORMAL LOW (ref 1.7–2.4)

## 2021-08-31 LAB — PATHOLOGIST SMEAR REVIEW

## 2021-08-31 LAB — PHOSPHORUS: Phosphorus: 5.1 mg/dL — ABNORMAL HIGH (ref 2.5–4.6)

## 2021-08-31 MED ORDER — MAGNESIUM SULFATE 2 GM/50ML IV SOLN
2.0000 g | Freq: Once | INTRAVENOUS | Status: AC
  Administered 2021-08-31: 2 g via INTRAVENOUS
  Filled 2021-08-31: qty 50

## 2021-08-31 MED ORDER — MAGNESIUM SULFATE IN D5W 1-5 GM/100ML-% IV SOLN
1.0000 g | Freq: Once | INTRAVENOUS | Status: DC
  Filled 2021-08-31: qty 100

## 2021-08-31 MED ORDER — KATE FARMS STANDARD 1.4 PO LIQD
325.0000 mL | Freq: Two times a day (BID) | ORAL | Status: DC
  Administered 2021-09-02: 325 mL via ORAL
  Filled 2021-08-31: qty 325

## 2021-08-31 MED ORDER — RISAQUAD PO CAPS
2.0000 | ORAL_CAPSULE | Freq: Every day | ORAL | Status: DC
  Administered 2021-08-31 – 2021-09-03 (×4): 2 via ORAL
  Filled 2021-08-31 (×4): qty 2

## 2021-08-31 MED ORDER — ENSURE ENLIVE PO LIQD
237.0000 mL | Freq: Two times a day (BID) | ORAL | Status: DC
  Administered 2021-09-02 – 2021-09-03 (×3): 237 mL via ORAL

## 2021-08-31 MED ORDER — METOPROLOL SUCCINATE ER 50 MG PO TB24
150.0000 mg | ORAL_TABLET | Freq: Every day | ORAL | Status: DC
  Administered 2021-09-01 – 2021-09-03 (×3): 150 mg via ORAL
  Filled 2021-08-31 (×3): qty 1

## 2021-08-31 NOTE — Progress Notes (Signed)
PHARMACY CONSULT NOTE  Pharmacy Consult for Electrolyte Monitoring and Replacement   Recent Labs: Potassium (mmol/L)  Date Value  08/31/2021 3.5   Magnesium (mg/dL)  Date Value  08/31/2021 1.6 (L)   Calcium (mg/dL)  Date Value  08/31/2021 9.6   Albumin (g/dL)  Date Value  08/29/2021 2.8 (L)   Phosphorus (mg/dL)  Date Value  08/31/2021 5.1 (H)   Sodium (mmol/L)  Date Value  08/31/2021 138   Assessment: Patient is a 31 y/o M with medical history including inflammatory arthritis on methotrexate and chronic steroids, history of tracheostomy, chronic respiratory failure on 2L oxygen at baseline, history of DVT on Xarelto, history of diverticulitis s/p colectomy and ostomy, history of MSSA bacteremia and pneumonia who is admitted with altered mental status. Pharmacy consulted to assist with electrolyte monitoring and replacement as indicated.  Diuresis: PO Lasix 40 mg daily Standing electrolyte replacement: Kcl 20 mEq BID + MagOx 400 mg BID  Goal of Therapy:  Electrolytes within normal limits  Plan:  Mg 2 g IV x 1 F/u with AM albs.   Craig [name] 08/31/2021 3:18 PM

## 2021-08-31 NOTE — Progress Notes (Signed)
[facility] NAME: Craig [name]    MR#:  [id number]  DATE OF BIRTH:  05/22/1991  SUBJECTIVE:  mother at bedside.  Low grade fever Tachycardia today, breathing better Patient remains  mainly bedbound secondary to his right knee pain REVIEW OF SYSTEMS:   Review of Systems  Constitutional:  Negative for chills, fever and weight loss.  HENT:  Negative for ear discharge, ear pain and nosebleeds.   Eyes:  Negative for blurred vision, pain and discharge.  Respiratory:  Positive for shortness of breath. Negative for sputum production, wheezing and stridor.   Cardiovascular:  Negative for chest pain, palpitations, orthopnea and PND.  Gastrointestinal:  Negative for abdominal pain, diarrhea, nausea and vomiting.  Genitourinary:  Negative for frequency and urgency.  Musculoskeletal:  Negative for back pain and joint pain.  Neurological:  Positive for weakness. Negative for sensory change, speech change and focal weakness.  Psychiatric/Behavioral:  Negative for depression and hallucinations. The patient is not nervous/anxious.   Tolerating Diet:yes Tolerating PT: home health PT OT  DRUG ALLERGIES:   Allergies  Allergen Reactions   Tramadol Hcl     SEIZURES!!! DO NOT REORDER AT DISCHARGE!!!   Fentanyl Citrate Other (See Comments)    Makes patient more agitated & delirious   Amoxicillin Rash    Tolerated cefepime and cefazolin 08/2020. Tolerated Zosyn 07/2021    VITALS:  Blood pressure (!) 110/56, pulse (!) 117, temperature 99.3 F (37.4 C), temperature source Oral, resp. rate 19, SpO2 96 %.  PHYSICAL EXAMINATION:   Physical Exam  GENERAL:  31 y.o.-year-old patient lying in the bed with no acute distress. Morbidly obese HEENT: Head atraumatic, normocephalic. Oropharynx and nasopharynx clear. [place]+ LUNGS: Normal breath sounds bilaterally, no wheezing, rales, rhonchi. No use of accessory muscles of respiration.  CARDIOVASCULAR:  S1, S2 normal. No murmurs, rubs, or gallops.  ABDOMEN: Soft, nontender, nondistended. Midline abdominal scar. Left lower quadrant colostomy present  EXTREMITIES: right knee decreased range of motion secondary to severe DJD. Tenderness over the right knee NEUROLOGIC: nonfocal PSYCHIATRIC:  patient is alert and oriented x 3.  SKIN:  Pressure Injury 07/08/21 Scrotum Bilateral Stage 1 -  Intact skin with non-blanchable redness of a localized area usually over a bony prominence. red nonblanchable on sacrum, above sacrum and scrotal sack (Active)  07/08/21   Location: Scrotum  Location Orientation: Bilateral  Staging: Stage 1 -  Intact skin with non-blanchable redness of a localized area usually over a bony prominence.  Wound Description (Comments): red nonblanchable on sacrum, above sacrum and scrotal sack  Present on Admission: Yes   LABORATORY PANEL:  CBC Recent Labs  Lab 08/29/21 0425  WBC 9.2  HGB 9.9*  HCT 34.2*  PLT 270     Chemistries  Recent Labs  Lab 08/29/21 0425 08/30/21 0047 08/31/21 0638  NA 136   < > 138  K 2.8*   < > 3.5  CL 92*   < > 94*  CO2 35*   < > 35*  GLUCOSE 143*   < > 98  BUN <5*   < > <5*  CREATININE 0.54*   < > 0.34*  CALCIUM 9.0   < > 9.6  MG 1.9   < > 1.6*  AST 30  --   --   ALT 11  --   --   ALKPHOS 58  --   --   BILITOT 0.6  --   --    < > =  values in this interval not displayed.    Cardiac Enzymes No results for input(s): TROPONINI in the last 168 hours. RADIOLOGY:  [place] RT LOWER EXTREM LTD SOFT TISSUE NON VASCULAR  Result Date: 08/30/2021 CLINICAL DATA:  Right knee pain and swelling for 6 months. Status post right knee arthroscopy 07/18/2021 EXAM: ULTRASOUND RIGHT LOWER EXTREMITY LIMITED TECHNIQUE: Ultrasound examination of the lower extremity soft tissues was performed in the area of clinical concern. COMPARISON:  Right knee radiographs 08/28/2021 FINDINGS: Joint Space: In the area of concern adjacent to the patient's surgical incision of  the anterior knee, there is soft tissue edema. There also is a hypoechoic avascular fluid collection measuring approximately 11.3 x 2.3 x 6.1 cm medial to the patella. A smaller fluid collection is seen lateral to the patella. This was not measured by the sonographer. IMPRESSION: In the area of interest around the patella, there are fluid collections adjacent to the medial greater than lateral aspects of the patella. It is unclear whether this represents fluid extending from a knee joint effusion versus separate more anterior fluid collections. Electronically Signed   By: Yvonne Kendall   On: 08/30/2021 17:23   [place] FINE NEEDLE ASP 1ST LESION  Result Date: 08/31/2021 INDICATION: Right knee pain and swelling. Recent ultrasound demonstrated a fluid collection along the anteromedial aspect of the right knee. EXAM: Ultrasound-guided aspiration of right lower extremity fluid collection MEDICATIONS: Local anesthetic, 1% lidocaine ANESTHESIA/SEDATION: None COMPLICATIONS: None immediate. PROCEDURE: Informed written consent was obtained from the patient after a thorough discussion of the procedural risks, benefits and alternatives. All questions were addressed. A timeout was performed prior to the initiation of the procedure. Ultrasound demonstrated a fluid collection along the anteromedial aspect of the right knee. The fluid collection appears to be external to the joint space. The overlying skin was prepped with chlorhexidine and sterile field was created. Initially, an 18 gauge spinal needle was directed into the fluid collection with ultrasound guidance. Opaque brown fluid was aspirated. This needle was removed and replaced with a Yueh catheter that was placed with ultrasound guidance. Total of 60 mL of the opaque brown fluid was removed until the fluid collection was decompressed. Bandage placed over the puncture site. FINDINGS: Large fluid collection with a few septations along the medial aspect of the right knee. The  fluid collection appears to be external to the joint space. 60 mL of brown opaque fluid was removed. Majority of the fluid was aspirated. IMPRESSION: Ultrasound-guided aspiration of the large fluid collection along the medial aspect of the right knee. Electronically Signed   By: Markus Daft M.D.   On: 08/31/2021 13:24   ASSESSMENT AND PLAN:  LAMONTA CYPRESS is a 31 y.o. 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   Acute on chronic hypoxic/hypercapnia respiratory failure present on admission acute metabolic encephalopathy-- resolved -- came in with ABG with Paco2 of 79 -- currently is on BiPAP. He is alert and oriented. -- Patient uses [facility]. He also uses chronic home oxygen. -- He follows with pulmonary Dr.Aleskerov -- continue to monitor his mentation. -- PRN bronchodilators  Right knee pain history  of MSSA right knee septic arthritis -- patient is bedbound secondary to unable to bear weight on right knee -- follows with Dr. Rudene Christians -- continue overall Cefradoxil for total 14 days. Patient follows with ID Dr. [name] --1/11-- antibiotics change to Keflex 1 g PO Q6 for one month per Dr. [name] -- will get ultrasound soft tissue right knee per Dr. Scharlene Gloss recommendation -- consider orthopedic consultation with Dr. Rudene Christians --1/12--right ant knee fluid collection aspirated by IR Dr Anselm Pancoast. --Fluid--turbid, NO crystals, WBC 36K  history of upper extremity DVT -- continue Xarelto -- recent CT chest was negative for  PE  Hypokalemia/hypomagnesimia -- pharmacy to replete -- start on PO 20 M EQ BID along with oral magnesium  type II diabetes, morbid obesity -- hemoglobin 6.8 in November 2022 -- diet control at home  morbid obesity/poor Dietary lifestyle --Dietitian to see pt for education -- complicates prognosis  Physical therapy .-- Recommends home health TOC for discharge planning.  Procedures: Family communication : mother at bedside Consults : ID, rheumatology CODE STATUS: full DVT Prophylaxis : Xarelto Level of care: Telemetry Cardiac Status is: Inpatient  Remains inpatient appropriate because: acute on chronic hypoxic hypercapnia respiratory failure.Right knee pain  Anticipate discharge 1 to 3 days if continues to show improvement.        TOTAL TIME TAKING CARE OF THIS PATIENT: 20 minutes.  >50% time spent on counselling and coordination of care  Note: This dictation was prepared with Dragon dictation along with smaller phrase technology. Any transcriptional errors that result from this process are unintentional.  Craig [name] M.D    [facility]   CC: Primary care physician; [name], MD [name], male   DOB: [birth year], 31 y.o.   MRN: [id number]

## 2021-08-31 NOTE — Progress Notes (Signed)
Initial Nutrition Assessment  DOCUMENTATION CODES:   Obesity unspecified  INTERVENTION:   Ensure Enlive po BID, each supplement provides 350 kcal and 20 grams of protein  [employer] Standard 1.0 chocolate BID- provides 325kcal and 16g/day protein   MVI po daily   Pt at high risk for nutrient deficiencies; will check fatty acids, thiamine, B12, folate, iron, TIBC, ferritin, vitamin C, zinc, copper and vitamins D, A, E, & K.   NUTRITION DIAGNOSIS:   Increased nutrient needs related to chronic illness as evidenced by estimated needs.  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  Met with pt in room today. Pt reports good appetite and oral intake pta. Pt reports that a typical breakfast for him would include a 3 egg omelet, sausage biscuit or biscuits and gravy. Pt reports that he generally eats a sandwich for lunch or a can of greens or pintos. For dinner, pt generally eats what is prepared by his wife or mother. Pt also reports that he eats out frequently. Pt reports that he does like fruits and vegetables; specifically he mentions greens, pintos and brussels sprouts. Pt reports that he has been drinking protein water and taking a MVI daily.  Pt reported on a previous admission that he drinks 1 pint of liquor daily; when asked today, pt reports that he has not drank any alcohol for months. RD discussed with pt today the importance of adequate nutrition needed preserve lean muscle. Recommended pt adding daily protein supplements at home to provide more protein and vitamins. Pt reports that in the past, milk products have given him diarrhea. Pt reports that he is willing to try supplements again. Will order Ensure which is lactose free and Dillard [place] which is plant based. Discussed with pt today about potassium containing foods. Recommended that pt add more fruits and vegetables to his diet and recommended a high protein diet to help with fluid retention and to preserve lean muscle. Pt was provided handouts with supporting information. RD will add supplements to help pt meet his estimated needs. RD will also check vitamin labs as pt at high risk for deficiency.   Per chart, pt appears weight stable from his previous admission. Pt is currently up ~35lbs from his UBW of 260lbs. Pt with severe edema; it is difficult to determine if any muscle depletions or significant weight changes.   Medications reviewed and include: risaquad, allopurinol, cephalexin, D3, folic acid, lasix, insulin, Mg oxide, MVI, nicotine, KCl  Labs reviewed: K 3.5 wnl, BUN <5(L), creat 0.34(L), P 5.1(H), Mg 1.6(L) Hgb 9.9(L), Hct 34.2(L) Cbgs- 187, 127 x 24 hrs AIC 6.8(H)- 11/19  NUTRITION - FOCUSED PHYSICAL EXAM:  Flowsheet Row Most Recent Value  Orbital Region No depletion  Upper Arm Region No depletion  Thoracic and Lumbar Region No depletion  Buccal Region No depletion  [place] Region  No depletion  Clavicle Bone Region No depletion  Clavicle and Acromion Bone Region No depletion  Scapular Bone Region No depletion  Dorsal Hand No depletion  Patellar Region No depletion  Anterior Thigh Region No depletion  Posterior Calf Region No depletion  Edema (RD  Assessment) Severe  Hair Reviewed  Eyes Reviewed  Mouth Reviewed  Skin Reviewed  Nails Reviewed   Diet Order:   Diet Order             Diet heart healthy/carb modified Room service appropriate? Yes; Fluid consistency: Thin  Diet effective now                  EDUCATION NEEDS:   Education needs have been addressed  Skin:  Skin Assessment: Reviewed RN Assessment (incision knee)  Last BM:  1/12- via ostomy- Pt reports normal output from his ostomy which he believes if less than 1.0L/day.   Height:   Ht Readings from Last 1 Encounters:  08/14/21 6' 0.99" (1.854 m)    Weight:   Wt Readings from Last 1 Encounters:  08/31/21 134.1 kg    Ideal Body Weight:  83.6 kg  BMI:  Body mass index is 39.01 kg/m.  Estimated Nutritional Needs:   Kcal:  2500-2800kcal/day  Protein:  >125g/day  Fluid:  2.5-2.8L/day  Koleen Distance MS, RD, LDN Please refer to [facility] for RD and/or RD on-call/weekend/after hours pager

## 2021-08-31 NOTE — Progress Notes (Signed)
Date of Admission:  08/28/2021     ID: Craig [name] is a 31 y.o. male  Principal Problem:   AMS (altered mental status) Active Problems:   Hypertension   Acute on chronic respiratory failure with hypoxemia (HCC)   Electrolyte abnormality   DM II (diabetes mellitus, type II), controlled (HCC)    Subjective: Underwent aspiration of fluid from rt knee Pain better he says   Medications:   acidophilus  2 capsule Oral Daily   allopurinol  300 mg Oral Daily   budesonide  0.25 mg Nebulization Daily   cephALEXin  1,000 mg Oral Q6H   cholecalciferol  1,000 Units Oral Daily   doxazosin  1 mg Oral Daily   DULoxetine  30 mg Oral Daily   folic acid  1 mg Oral Daily   furosemide  40 mg Oral Daily   insulin aspart  0-15 Units Subcutaneous TID WC   insulin aspart  0-5 Units Subcutaneous QHS   magnesium oxide  400 mg Oral BID   [START ON 09/01/2021] metoprolol succinate  150 mg Oral Daily   multivitamin with minerals  1 tablet Oral Daily   nicotine  21 mg Transdermal Daily   potassium chloride  20 mEq Oral BID   pregabalin  200 mg Oral BID   rivaroxaban  20 mg Oral Q supper   sodium chloride flush  3 mL Intravenous Q12H    Objective: Vital signs in last 24 hours: Temp:  [98.1 F (36.7 C)-99.3 F (37.4 C)] 99.3 F (37.4 C) (01/12 0826) Pulse Rate:  [99-117] 117 (01/12 0826) Resp:  [19-23] 19 (01/12 0826) BP: (105-131)/(54-72) 110/56 (01/12 0826) SpO2:  [96 %-99 %] 96 % (01/12 0826)  PHYSICAL EXAM:  General: Alert, cooperative, no distress, appears stated age.  Lungs: b/l air entry Heart: Tachycardia Abdomen: Soft, colostomy Extremities:rt knee swollen Lymph: Cervical, supraclavicular normal. Neurologic: Grossly non-focal  Lab Results Recent Labs    08/28/21 1752 08/29/21 0425 08/30/21 0047 08/30/21 0445 08/30/21 1548 08/31/21 0638  WBC 9.1 9.2  --   --   --   --   HGB 11.3* 9.9*  --   --   --   --   HCT 38.0* 34.2*  --   --   --   --   NA 137 136   < >  138  --  138  K 2.6* 2.8*   < > 2.9* 3.5 3.5  CL 90* 92*   < > 95*  --  94*  CO2 34* 35*   < > 35*  --  35*  BUN <5* <5*   < > <5*  --  <5*  CREATININE 0.49* 0.54*   < > 0.46*  --  0.34*   < > = values in this interval not displayed.   Liver Panel Recent Labs    08/28/21 1752 08/29/21 0425  PROT 7.6 6.8  ALBUMIN 3.1* 2.8*  AST 31 30  ALT 12 11  ALKPHOS 71 58  BILITOT 0.7 0.6  Synovial fluid 36K wbc -91% Neutrohils  Microbiology: BC ng Studies/Results: [place] RT LOWER EXTREM LTD SOFT TISSUE NON VASCULAR  Result Date: 08/30/2021 CLINICAL DATA:  Right knee pain and swelling for 6 months. Status post right knee arthroscopy 07/18/2021 EXAM: ULTRASOUND RIGHT LOWER EXTREMITY LIMITED TECHNIQUE: Ultrasound examination of the lower extremity soft tissues was performed in the area of clinical concern. COMPARISON:  Right knee radiographs 08/28/2021 FINDINGS: Joint Space: In the area of concern adjacent to  the patient's surgical incision of the anterior knee, there is soft tissue edema. There also is a hypoechoic avascular fluid collection measuring approximately 11.3 x 2.3 x 6.1 cm medial to the patella. A smaller fluid collection is seen lateral to the patella. This was not measured by the sonographer. IMPRESSION: In the area of interest around the patella, there are fluid collections adjacent to the medial greater than lateral aspects of the patella. It is unclear whether this represents fluid extending from a knee joint effusion versus separate more anterior fluid collections. Electronically Signed   By: Yvonne Kendall   On: 08/30/2021 17:23   [place] FINE NEEDLE ASP 1ST LESION  Result Date: 08/31/2021 INDICATION: Right knee pain and swelling. Recent ultrasound demonstrated a fluid collection along the anteromedial aspect of the right knee. EXAM: Ultrasound-guided aspiration of right lower extremity fluid collection MEDICATIONS: Local anesthetic, 1% lidocaine ANESTHESIA/SEDATION: None COMPLICATIONS: None  immediate. PROCEDURE: Informed written consent was obtained from the patient after a thorough discussion of the procedural risks, benefits and alternatives. All questions were addressed. A timeout was performed prior to the initiation of the procedure. Ultrasound demonstrated a fluid collection along the anteromedial aspect of the right knee. The fluid collection appears to be external to the joint space. The overlying skin was prepped with chlorhexidine and sterile field was created. Initially, an 18 gauge spinal needle was directed into the fluid collection with ultrasound guidance. Opaque brown fluid was aspirated. This needle was removed and replaced with a Yueh catheter that was placed with ultrasound guidance. Total of 60 mL of the opaque brown fluid was removed until the fluid collection was decompressed. Bandage placed over the puncture site. FINDINGS: Large fluid collection with a few septations along the medial aspect of the right knee. The fluid collection appears to be external to the joint space. 60 mL of brown opaque fluid was removed. Majority of the fluid was aspirated. IMPRESSION: Ultrasound-guided aspiration of the large fluid collection along the medial aspect of the right knee. Electronically Signed   By: Markus Daft M.D.   On: 08/31/2021 13:24     Assessment/Plan:  ?Acute hypoxic/hypercarbic  resp failure ?multifactorial COVID related lung disease OSA Critical illness polyneuropathy ( is this affecting his resp/intercostal muscles ? Obesity hypoventilation syndrome Pt has BIPAP   Musculoskeletal disorder -due to critical illness polyneuropathy, long covid  especially rt knee and left shoulder and inability to walk ? Inflammatory arthritis   Recent past h/o Rt septic knee arthritis due to MSSA    Recent H/o MSSA bacteremia Received 4 weeks of IV cefazolin which he completed on 08/10/21 followed by PO cefadroxil 1 gram BID which he is on it currently Has low grade fever Today  underwent aspiration of the rt knee- wbc 36K, predominantly neutrophils- culture sent Blood culture neg so far On keflex 1 gram Po Q6 hrly    Tachycardia ? Autonomic component TEE 07/12/21 no vegetation EF 60-65%   GOUT   Discussed the management with patient , wife and care team

## 2021-08-31 NOTE — Progress Notes (Signed)
°   08/31/21 0826  Assess: MEWS Score  Temp 99.3 F (37.4 C)  BP (!) 110/56  Pulse Rate (!) 117  Resp 19  SpO2 96 %  Assess: MEWS Score  MEWS Temp 0  MEWS Systolic 0  MEWS Pulse 2  MEWS RR 0  MEWS LOC 0  MEWS Score 2  MEWS Score Color Yellow  Assess: if the MEWS score is Yellow or Red  Were vital signs taken at a resting state? Yes  Focused Assessment No change from prior assessment  Does the patient meet 2 or more of the SIRS criteria? No  MEWS guidelines implemented *See Row Information* Yes  Treat  MEWS Interventions Administered scheduled meds/treatments  Pain Scale 0-10  Pain Type Chronic pain  Pain Location Knee  Pain Orientation Right  Pain Descriptors / Indicators Aching  Pain Frequency Constant  Pain Onset On-going  Patients Stated Pain Goal 2  Pain Intervention(s) Medication (See eMAR)  Multiple Pain Sites No  Nausea relieved by Antiemetic  Patients response to intervention Relief  Notify: Charge Nurse/RN  Name of Charge Nurse/RN Notified Jessica  Date Charge Nurse/RN Notified 08/31/21  Time Charge Nurse/RN Notified [phone]  Document  Patient Outcome Stabilized after interventions  Progress note created (see row info) Yes  Assess: SIRS CRITERIA  SIRS Temperature  0  SIRS Pulse 1  SIRS Respirations  0  SIRS WBC 0  SIRS Score Sum  1   Septic arthritis, persistent pain, will continue to monitor elevated heart rate and fever.

## 2021-08-31 NOTE — Consult Note (Signed)
Rheumatology follow-up:  For ultrasound directed aspiration of infrapatellar fluid collection today.  Would like to restage his inflammatory arthritis. Prior hyperuricemia on allopurinol.  Prior elevated inflammatory markers and anti-CCP antibodies.  Now off immunosuppression(prednisone and methotrexate)  Will send labs tomorrow am.    May want to x-ray other joints including bilateral hand films and right shoulder if we think about adding back any arthritic medicines.

## 2021-08-31 NOTE — Procedures (Signed)
Interventional Radiology Procedure:   Indications: Fluid collection around right knee  Procedure: US guided aspiration  Findings: Large fluid collection along anterior medial aspect of right knee.  Fluid collection had a few septations. 60 ml of brown opaque fluid removed.  Collection was decompressed after aspiration.   Complications: No immediate complications noted.     EBL: Minimal  Plan: Send fluid for analysis   Craig [name] R. Anselm Pancoast, MD  Pager: [phone]

## 2021-09-01 ENCOUNTER — Encounter
Admission: EM | Disposition: A | Discharge status: Home Health | Payer: Self-pay | Source: Home / Self Care | Attending: Internal Medicine

## 2021-09-01 ENCOUNTER — Inpatient Hospital Stay: Payer: Medicaid Other | Admitting: Anesthesiology

## 2021-09-01 ENCOUNTER — Inpatient Hospital Stay: Payer: Medicaid Other

## 2021-09-01 DIAGNOSIS — G4733 Obstructive sleep apnea (adult) (pediatric): Secondary | ICD-10-CM

## 2021-09-01 DIAGNOSIS — E1142 Type 2 diabetes mellitus with diabetic polyneuropathy: Secondary | ICD-10-CM

## 2021-09-01 DIAGNOSIS — M25561 Pain in right knee: Secondary | ICD-10-CM

## 2021-09-01 DIAGNOSIS — G8929 Other chronic pain: Secondary | ICD-10-CM

## 2021-09-01 DIAGNOSIS — J9601 Acute respiratory failure with hypoxia: Secondary | ICD-10-CM | POA: Diagnosis not present

## 2021-09-01 DIAGNOSIS — J9602 Acute respiratory failure with hypercapnia: Secondary | ICD-10-CM | POA: Diagnosis not present

## 2021-09-01 DIAGNOSIS — M799 Soft tissue disorder, unspecified: Secondary | ICD-10-CM

## 2021-09-01 HISTORY — PX: KNEE ARTHROSCOPY: SHX127

## 2021-09-01 LAB — CBC
HCT: 33.6 % — ABNORMAL LOW (ref 39.0–52.0)
Hemoglobin: 9.8 g/dL — ABNORMAL LOW (ref 13.0–17.0)
MCH: 26.3 pg (ref 26.0–34.0)
MCHC: 29.2 g/dL — ABNORMAL LOW (ref 30.0–36.0)
MCV: 90.1 fL (ref 80.0–100.0)
Platelets: 280 10*3/uL (ref 150–400)
RBC: 3.73 MIL/uL — ABNORMAL LOW (ref 4.22–5.81)
RDW: 16.9 % — ABNORMAL HIGH (ref 11.5–15.5)
WBC: 6.3 10*3/uL (ref 4.0–10.5)
nRBC: 0 % (ref 0.0–0.2)

## 2021-09-01 LAB — FERRITIN: Ferritin: 67 ng/mL (ref 24–336)

## 2021-09-01 LAB — GLUCOSE, CAPILLARY
Glucose-Capillary: 108 mg/dL — ABNORMAL HIGH (ref 70–99)
Glucose-Capillary: 107 mg/dL — ABNORMAL HIGH (ref 70–99)
Glucose-Capillary: 190 mg/dL — ABNORMAL HIGH (ref 70–99)
Glucose-Capillary: 303 mg/dL — ABNORMAL HIGH (ref 70–99)

## 2021-09-01 LAB — BASIC METABOLIC PANEL
Anion gap: 11 (ref 5–15)
BUN: <5 mg/dL — ABNORMAL LOW (ref 6–20)
CO2: 37 mmol/L — ABNORMAL HIGH (ref 22–32)
Calcium: 9.5 mg/dL (ref 8.9–10.3)
Chloride: 90 mmol/L — ABNORMAL LOW (ref 98–111)
Creatinine, Ser: 0.44 mg/dL — ABNORMAL LOW (ref 0.61–1.24)
GFR, Estimated: >60 mL/min (ref >60)
Glucose, Bld: 109 mg/dL — ABNORMAL HIGH (ref 70–99)
Potassium: 3.6 mmol/L (ref 3.5–5.1)
Sodium: 138 mmol/L (ref 135–145)

## 2021-09-01 LAB — MAGNESIUM: Magnesium: 1.6 mg/dL — ABNORMAL LOW (ref 1.7–2.4)

## 2021-09-01 LAB — VITAMIN B12: Vitamin B-12: 503 pg/mL (ref 180–914)

## 2021-09-01 LAB — FOLATE: Folate: 34 ng/mL (ref >5.9)

## 2021-09-01 LAB — URIC ACID, BODY FLUID: Uric Acid Body Fluid: 5.1 mg/dL

## 2021-09-01 LAB — PROTEIN, BODY FLUID (OTHER): Total Protein, Body Fluid Other: 4.1 g/dL

## 2021-09-01 LAB — URIC ACID: Uric Acid, Serum: 6.1 mg/dL (ref 3.7–8.6)

## 2021-09-01 LAB — IRON AND TIBC
Iron: 31 ug/dL — ABNORMAL LOW (ref 45–182)
Saturation Ratios: 12 % — ABNORMAL LOW (ref 17.9–39.5)
TIBC: 252 ug/dL (ref 250–450)
UIBC: 221 ug/dL

## 2021-09-01 LAB — VITAMIN D 25 HYDROXY (VIT D DEFICIENCY, FRACTURES): Vit D, 25-Hydroxy: 42.71 ng/mL (ref 30–100)

## 2021-09-01 LAB — C-REACTIVE PROTEIN: CRP: 8.4 mg/dL — ABNORMAL HIGH (ref <1.0)

## 2021-09-01 LAB — SEDIMENTATION RATE: Sed Rate: 77 mm/hr — ABNORMAL HIGH (ref 0–15)

## 2021-09-01 IMAGING — DX DG HAND 2V*L*
2 series · 2 of 2 positions shown · non-contrast
Comparison: None.

CLINICAL DATA: Left hand pain.

EXAM:
LEFT HAND - 2 VIEW

[hand ap]
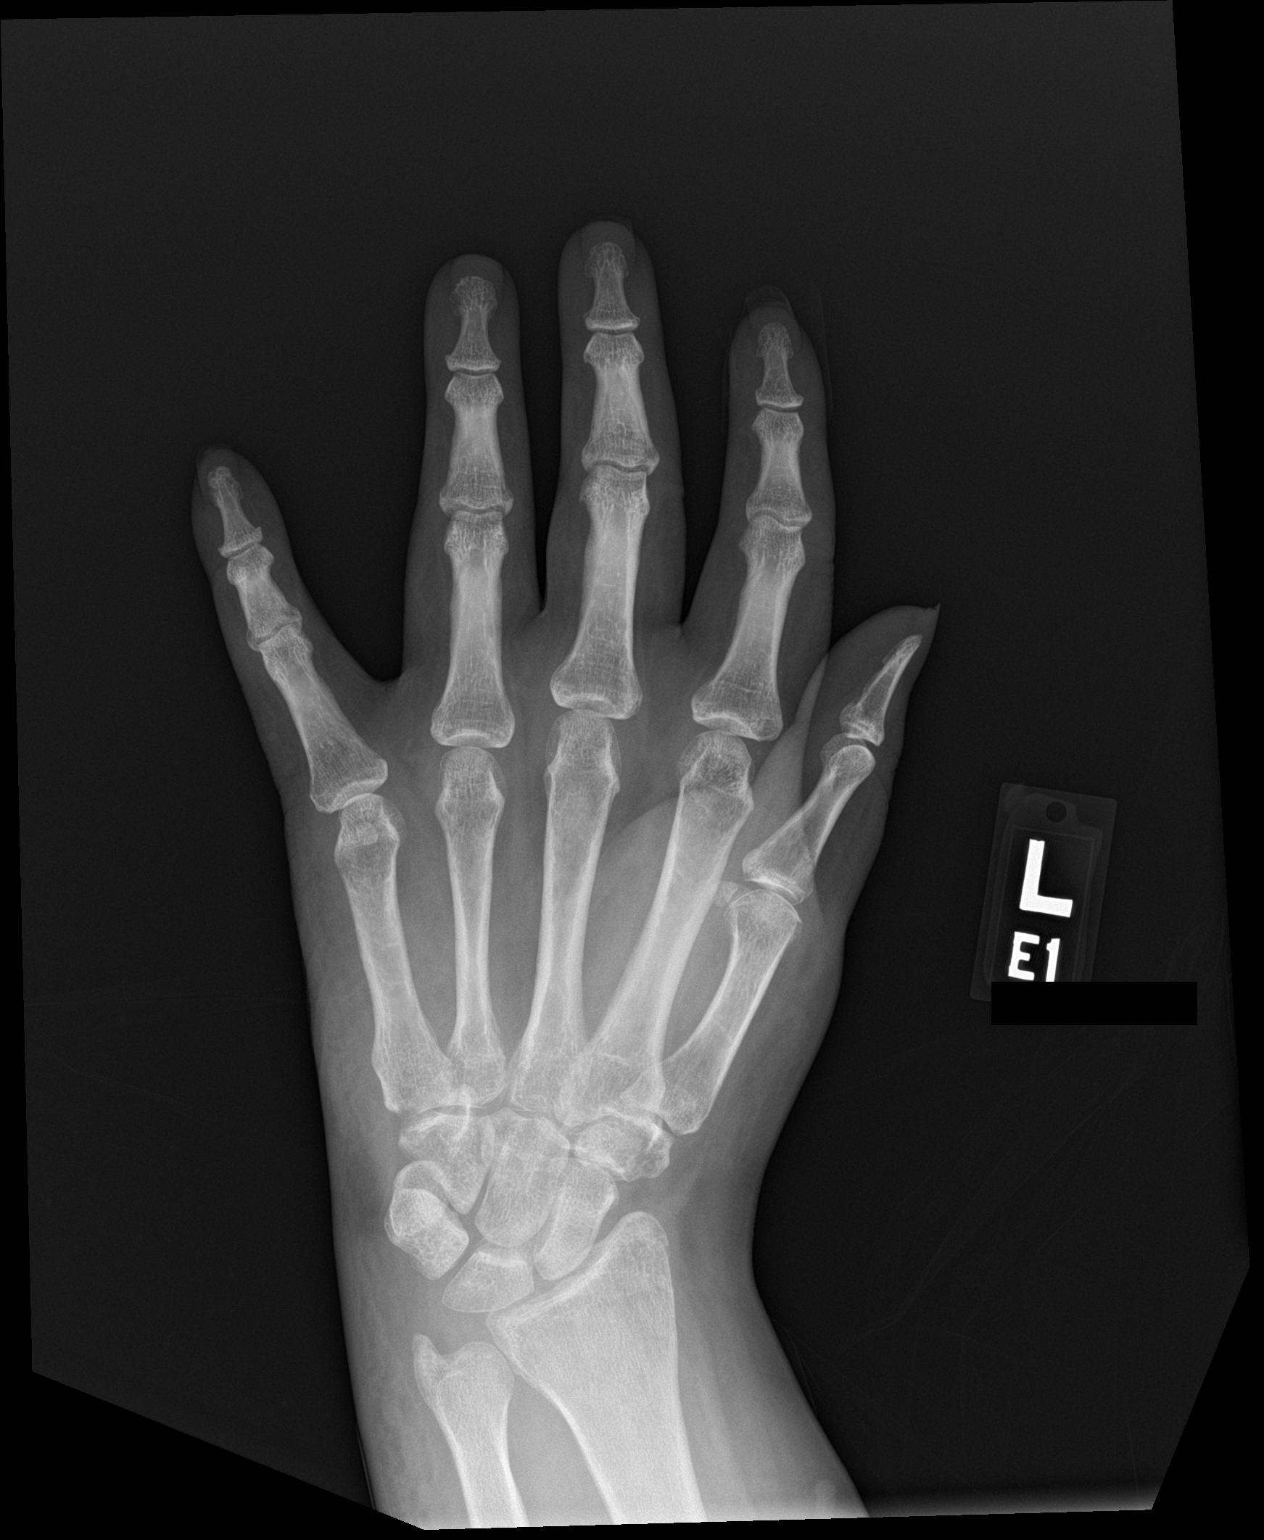

[hand lat]
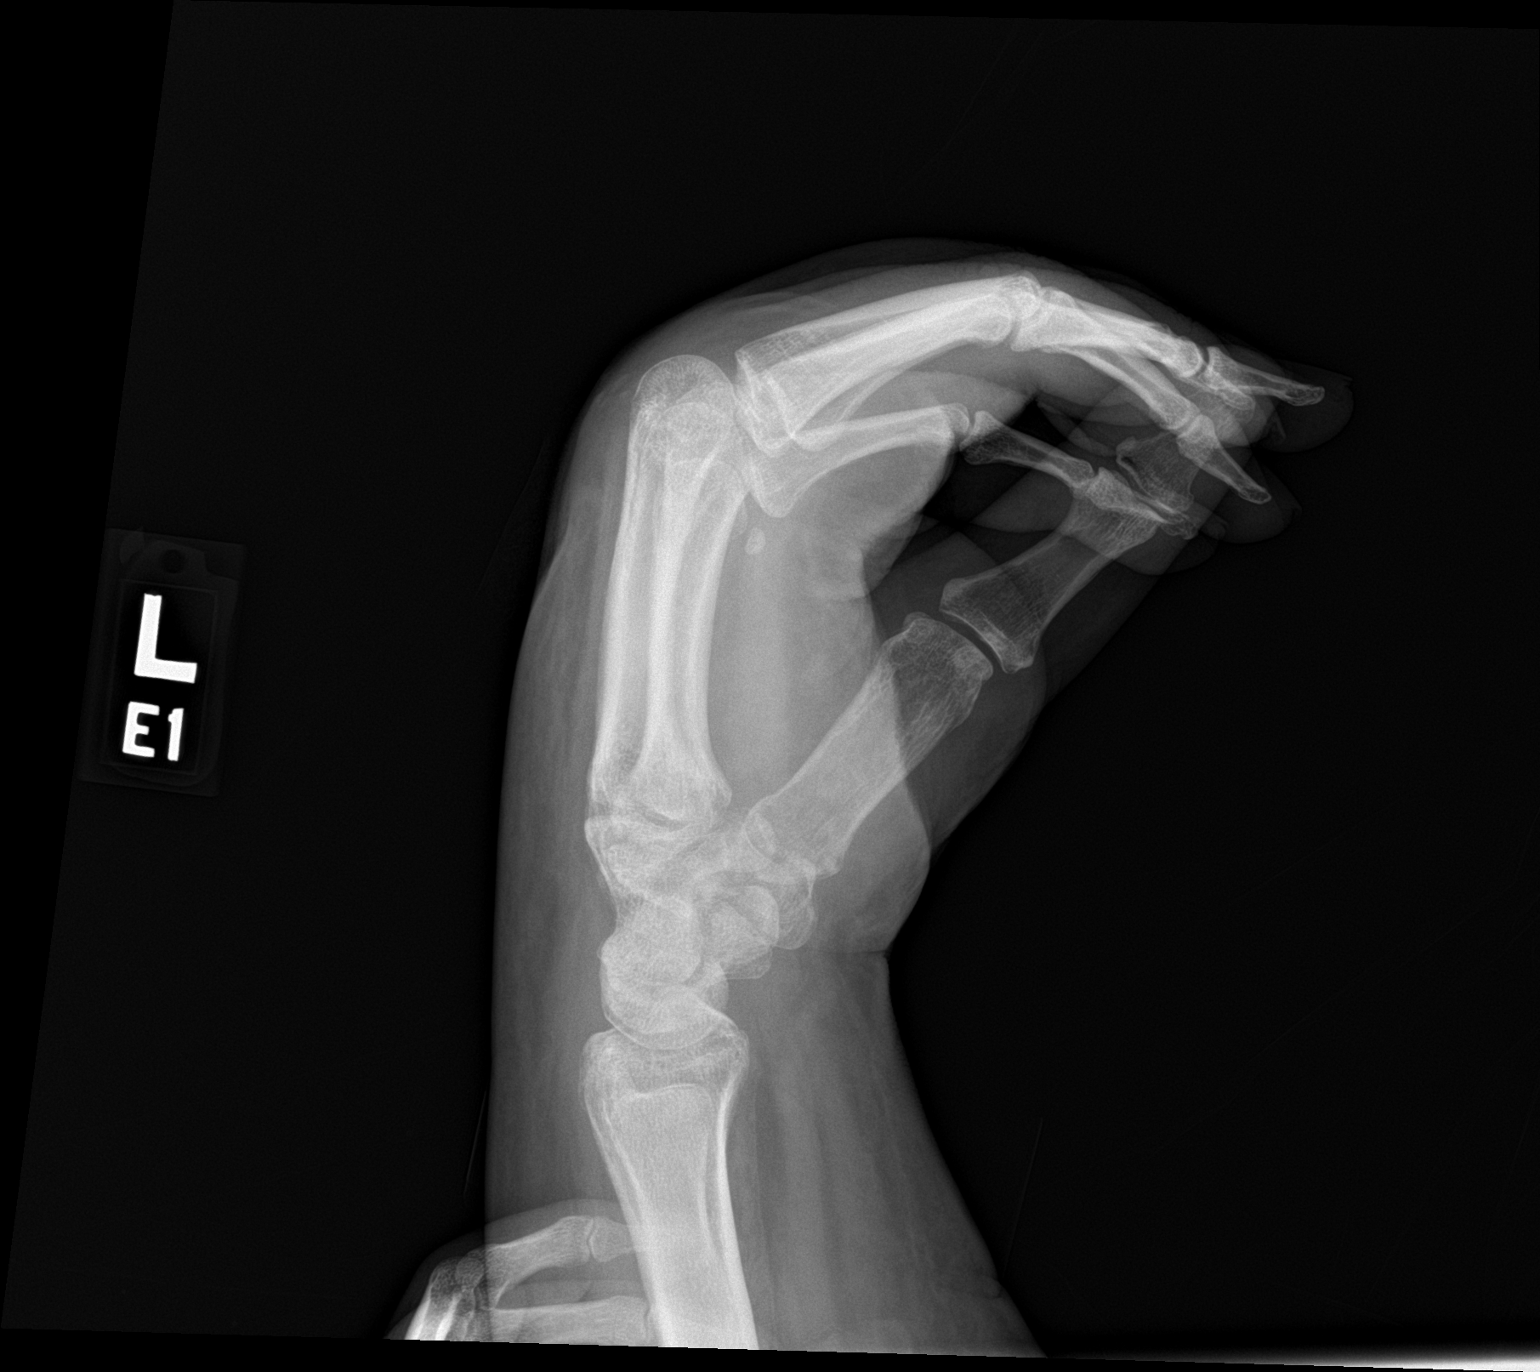

[2 of 2 positions shown; findings below may reference images not displayed]

FINDINGS: There is no evidence of fracture or dislocation. There is no
evidence of arthropathy or other focal bone abnormality. Soft
tissues are unremarkable.
IMPRESSION: Negative.

## 2021-09-01 SURGERY — ARTHROSCOPY, KNEE
Anesthesia: General | Site: Knee | Laterality: Right

## 2021-09-01 MED ORDER — ALLOPURINOL 300 MG PO TABS
400.0000 mg | ORAL_TABLET | Freq: Every day | ORAL | Status: DC
  Administered 2021-09-02 – 2021-09-03 (×2): 400 mg via ORAL
  Filled 2021-09-01 (×2): qty 1

## 2021-09-01 MED ORDER — MIDAZOLAM HCL 2 MG/2ML IJ SOLN
INTRAMUSCULAR | Status: AC
  Filled 2021-09-01: qty 2

## 2021-09-01 MED ORDER — LIDOCAINE HCL (PF) 2 % IJ SOLN
INTRAMUSCULAR | Status: AC
  Filled 2021-09-01: qty 5

## 2021-09-01 MED ORDER — DEXAMETHASONE SODIUM PHOSPHATE 10 MG/ML IJ SOLN
INTRAMUSCULAR | Status: DC | PRN
  Administered 2021-09-01: 10 mg via INTRAVENOUS

## 2021-09-01 MED ORDER — FENTANYL CITRATE (PF) 100 MCG/2ML IJ SOLN
INTRAMUSCULAR | Status: AC
  Filled 2021-09-01: qty 2

## 2021-09-01 MED ORDER — IPRATROPIUM-ALBUTEROL 0.5-2.5 (3) MG/3ML IN SOLN: 3.0000 mL | Freq: Once | RESPIRATORY_TRACT | Status: AC

## 2021-09-01 MED ORDER — ROCURONIUM BROMIDE 100 MG/10ML IV SOLN
INTRAVENOUS | Status: DC | PRN
  Administered 2021-09-01: 5 mg via INTRAVENOUS

## 2021-09-01 MED ORDER — KETAMINE HCL 10 MG/ML IJ SOLN
INTRAMUSCULAR | Status: DC | PRN
  Administered 2021-09-01: 50 mg via INTRAVENOUS

## 2021-09-01 MED ORDER — LIDOCAINE HCL (CARDIAC) PF 100 MG/5ML IV SOSY
PREFILLED_SYRINGE | INTRAVENOUS | Status: DC | PRN
  Administered 2021-09-01: 50 mg via INTRAVENOUS

## 2021-09-01 MED ORDER — FENTANYL CITRATE (PF) 100 MCG/2ML IJ SOLN
INTRAMUSCULAR | Status: DC | PRN
  Administered 2021-09-01 (×2): 50 ug via INTRAVENOUS

## 2021-09-01 MED ORDER — ONDANSETRON HCL 4 MG/2ML IJ SOLN
INTRAMUSCULAR | Status: AC
  Filled 2021-09-01: qty 2

## 2021-09-01 MED ORDER — MORPHINE SULFATE (PF) 2 MG/ML IV SOLN
2.0000 mg | INTRAVENOUS | Status: DC | PRN
Start: 2021-09-01 — End: 2021-09-03
  Administered 2021-09-01 – 2021-09-03 (×3): 2 mg via INTRAVENOUS
  Filled 2021-09-01 (×2): qty 1

## 2021-09-01 MED ORDER — PROPOFOL 10 MG/ML IV BOLUS
INTRAVENOUS | Status: AC
  Filled 2021-09-01: qty 40

## 2021-09-01 MED ORDER — ALLOPURINOL 100 MG PO TABS
100.0000 mg | ORAL_TABLET | Freq: Once | ORAL | Status: DC
  Filled 2021-09-01: qty 1

## 2021-09-01 MED ORDER — SUGAMMADEX SODIUM 500 MG/5ML IV SOLN
INTRAVENOUS | Status: AC
  Filled 2021-09-01: qty 5

## 2021-09-01 MED ORDER — SUCCINYLCHOLINE CHLORIDE 200 MG/10ML IV SOSY
PREFILLED_SYRINGE | INTRAVENOUS | Status: DC | PRN
  Administered 2021-09-01: 150 mg via INTRAVENOUS

## 2021-09-01 MED ORDER — MIDAZOLAM HCL 2 MG/2ML IJ SOLN
INTRAMUSCULAR | Status: DC | PRN
  Administered 2021-09-01: 2 mg via INTRAVENOUS

## 2021-09-01 MED ORDER — DEXAMETHASONE SODIUM PHOSPHATE 10 MG/ML IJ SOLN
INTRAMUSCULAR | Status: AC
  Filled 2021-09-01: qty 1

## 2021-09-01 MED ORDER — IPRATROPIUM-ALBUTEROL 0.5-2.5 (3) MG/3ML IN SOLN
RESPIRATORY_TRACT | Status: AC
  Administered 2021-09-01: 3 mL via RESPIRATORY_TRACT
  Filled 2021-09-01: qty 3

## 2021-09-01 MED ORDER — SUGAMMADEX SODIUM 500 MG/5ML IV SOLN
INTRAVENOUS | Status: DC | PRN
  Administered 2021-09-01: 200 mg via INTRAVENOUS

## 2021-09-01 MED ORDER — ONDANSETRON HCL 4 MG/2ML IJ SOLN
4.0000 mg | Freq: Four times a day (QID) | INTRAMUSCULAR | Status: DC | PRN
  Administered 2021-09-01 (×2): 4 mg via INTRAVENOUS
  Filled 2021-09-01: qty 2

## 2021-09-01 MED ORDER — FENTANYL CITRATE (PF) 100 MCG/2ML IJ SOLN
25.0000 ug | INTRAMUSCULAR | Status: DC | PRN
  Administered 2021-09-01: 25 ug via INTRAVENOUS

## 2021-09-01 MED ORDER — KETAMINE HCL 50 MG/5ML IJ SOSY
PREFILLED_SYRINGE | INTRAMUSCULAR | Status: AC
  Filled 2021-09-01: qty 5

## 2021-09-01 MED ORDER — CEFAZOLIN SODIUM 1 G IJ SOLR
INTRAMUSCULAR | Status: AC
  Filled 2021-09-01: qty 20

## 2021-09-01 MED ORDER — ALLOPURINOL 100 MG PO TABS: 100.0000 mg | ORAL_TABLET | Freq: Every day | ORAL | Status: DC

## 2021-09-01 MED ORDER — MORPHINE SULFATE (PF) 2 MG/ML IV SOLN
2.0000 mg | Freq: Once | INTRAVENOUS | Status: DC
Start: 2021-09-01 — End: 2021-09-03
  Filled 2021-09-01: qty 1

## 2021-09-01 MED ORDER — HYDROCODONE-ACETAMINOPHEN 7.5-325 MG PO TABS
ORAL_TABLET | ORAL | Status: AC
  Filled 2021-09-01: qty 1

## 2021-09-01 MED ORDER — BUPIVACAINE-EPINEPHRINE (PF) 0.5% -1:200000 IJ SOLN
INTRAMUSCULAR | Status: AC
  Filled 2021-09-01: qty 30

## 2021-09-01 MED ORDER — RINGERS IRRIGATION IR SOLN
Status: DC | PRN
  Administered 2021-09-01: 12000 mL

## 2021-09-01 MED ORDER — ONDANSETRON HCL 4 MG/2ML IJ SOLN: 4.0000 mg | Freq: Once | INTRAMUSCULAR | Status: DC | PRN

## 2021-09-01 MED ORDER — DEXMEDETOMIDINE (PRECEDEX) IN NS 20 MCG/5ML (4 MCG/ML) IV SYRINGE
PREFILLED_SYRINGE | INTRAVENOUS | Status: DC | PRN
  Administered 2021-09-01 (×4): 10 ug via INTRAVENOUS

## 2021-09-01 MED ORDER — PROPOFOL 10 MG/ML IV BOLUS
INTRAVENOUS | Status: DC | PRN
Start: 2021-09-01 — End: 2021-09-01
  Administered 2021-09-01: 150 mg via INTRAVENOUS
  Administered 2021-09-01: 50 mg via INTRAVENOUS

## 2021-09-01 MED ORDER — MAGNESIUM SULFATE 2 GM/50ML IV SOLN
2.0000 g | Freq: Once | INTRAVENOUS | Status: AC
  Administered 2021-09-01: 2 g via INTRAVENOUS
  Filled 2021-09-01: qty 50

## 2021-09-01 SURGICAL SUPPLY — 31 items
ADPR IRR PORT MULTIBAG TUBE (MISCELLANEOUS) ×1
APL PRP STRL LF DISP 70% ISPRP (MISCELLANEOUS) ×1
BLADE INCISOR PLUS 4.5 (BLADE) ×1 IMPLANT
BNDG ELASTIC 4X5.8 VLCR STR LF (GAUZE/BANDAGES/DRESSINGS) ×2 IMPLANT
BNDG ELASTIC 6X5.8 VLCR STR LF (GAUZE/BANDAGES/DRESSINGS) ×1 IMPLANT
CHLORAPREP W/TINT 26 (MISCELLANEOUS) ×2 IMPLANT
DRAPE ARTHRO LIMB 89X125 STRL (DRAPES) ×2 IMPLANT
GAUZE SPONGE 4X4 12PLY STRL (GAUZE/BANDAGES/DRESSINGS) ×2 IMPLANT
GAUZE XEROFORM 1X8 LF (GAUZE/BANDAGES/DRESSINGS) ×1 IMPLANT
GLOVE SURG SYN 9.0  PF PI (GLOVE) ×1
GLOVE SURG SYN 9.0 PF PI (GLOVE) ×1 IMPLANT
GOWN SRG 2XL LVL 4 RGLN SLV (GOWNS) ×1 IMPLANT
GOWN STRL NON-REIN 2XL LVL4 (GOWNS) ×2
GOWN STRL REUS W/ TWL LRG LVL3 (GOWN DISPOSABLE) ×2 IMPLANT
GOWN STRL REUS W/TWL LRG LVL3 (GOWN DISPOSABLE) ×4
IV LACTATED RINGER IRRG 3000ML (IV SOLUTION) ×8
IV LR IRRIG 3000ML ARTHROMATIC (IV SOLUTION) ×2 IMPLANT
KIT TURNOVER KIT A (KITS) ×2 IMPLANT
MANIFOLD NEPTUNE II (INSTRUMENTS) ×4 IMPLANT
NEEDLE HYPO 22GX1.5 SAFETY (NEEDLE) ×2 IMPLANT
PACK ARTHROSCOPY KNEE (MISCELLANEOUS) ×2 IMPLANT
SCALPEL PROTECTED #11 DISP (BLADE) ×2 IMPLANT
SET Y ADAPTER MULIT-BAG IRRIG (MISCELLANEOUS) ×1 IMPLANT
SPONGE DRAIN TRACH 4X4 STRL 2S (GAUZE/BANDAGES/DRESSINGS) ×1 IMPLANT
SPONGE T-LAP 18X18 ~~LOC~~+RFID (SPONGE) ×2 IMPLANT
SUT ETHILON 4-0 (SUTURE) ×2
SUT ETHILON 4-0 FS2 18XMFL BLK (SUTURE) ×1
SUTURE ETHLN 4-0 FS2 18XMF BLK (SUTURE) ×1 IMPLANT
TUBING INFLOW SET DBFLO PUMP (TUBING) ×2 IMPLANT
TUBING OUTFLOW SET DBLFO PUMP (TUBING) ×2 IMPLANT
WAND COBLATION FLOW 50 (SURGICAL WAND) IMPLANT

## 2021-09-01 NOTE — Progress Notes (Signed)
PHARMACY CONSULT NOTE  Pharmacy Consult for Electrolyte Monitoring and Replacement   Recent Labs: Potassium (mmol/L)  Date Value  09/01/2021 3.6   Magnesium (mg/dL)  Date Value  09/01/2021 1.6 (L)   Calcium (mg/dL)  Date Value  09/01/2021 9.5   Albumin (g/dL)  Date Value  08/29/2021 2.8 (L)   Phosphorus (mg/dL)  Date Value  08/31/2021 5.1 (H)   Sodium (mmol/L)  Date Value  09/01/2021 138   Assessment: Patient is a 31 y/o M with medical history including inflammatory arthritis on methotrexate and chronic steroids, history of tracheostomy, chronic respiratory failure on 2L oxygen at baseline, history of DVT on Xarelto, history of diverticulitis s/p colectomy and ostomy, history of MSSA bacteremia and pneumonia who is admitted with altered mental status. Pharmacy consulted to assist with electrolyte monitoring and replacement as indicated.  Diuresis: PO Lasix 40 mg daily Standing electrolyte replacement: Kcl 20 mEq BID + MagOx 400 mg BID  Goal of Therapy:  Electrolytes within normal limits  Plan:  Mg 2 g IV x 1 F/u with AM albs.   [name] 09/01/2021 10:36 AM

## 2021-09-01 NOTE — Anesthesia Preprocedure Evaluation (Signed)
Anesthesia Evaluation  Patient identified by MRN, date of birth, ID band Patient awake    Reviewed: Allergy & Precautions, H&P , NPO status , Patient's Chart, lab work & pertinent test results, reviewed documented beta blocker date and time   Airway Mallampati: III  TM Distance: >3 FB Neck ROM: full    Dental  (+) Teeth Intact   Pulmonary neg pulmonary ROS,    Pulmonary exam normal        Cardiovascular Exercise Tolerance: Poor hypertension, On Medications negative cardio ROS Normal cardiovascular exam Rhythm:regular Rate:Normal     Neuro/Psych negative neurological ROS  negative psych ROS   GI/Hepatic negative GI ROS, Neg liver ROS,   Endo/Other  diabetesMorbid obesity  Renal/GU Renal disease  negative genitourinary   Musculoskeletal   Abdominal   Peds  Hematology negative hematology ROS (+)   Anesthesia Other Findings Past Medical History: No date: Gout No date: Hypertension No date: Rupture of bowel ([place]) Past Surgical History: 05/18/2020: COLONOSCOPY WITH PROPOFOL; N/A     Comment:  Procedure: COLONOSCOPY WITH PROPOFOL;  Surgeon: Benjamine Sprague, DO;  Location: [facility];  Service: General;               Laterality: N/A; 11/22/2019: COLOSTOMY; N/A     Comment:  Procedure: COLOSTOMY;  Surgeon: Herbert Pun,               MD;  Location: [facility] ORS;  Service: General;  Laterality:               N/A; 07/11/2021: KNEE ARTHROSCOPY; Right     Comment:  Procedure: SYNOVIAL BIOPSY AND ARTHROSCOPIC IRRGATION               AND DEBRIDEMENT OF SEPTIC KNEE;  Surgeon: Hessie Knows,               MD;  Location: [facility] ORS;  Service: Orthopedics;                Laterality: Right; 07/18/2021: KNEE ARTHROSCOPY; Right     Comment:  Procedure: ARTHROSCOPY KNEE, IRRIGATION AND DEBRIDEMENT;              Surgeon: Hessie Knows, MD;  Location: [facility] ORS;                Service: Orthopedics;  Laterality:  Right; 11/22/2019: LAPAROTOMY; N/A     Comment:  Procedure: EXPLORATORY LAPAROTOMY;  Surgeon:               Herbert Pun, MD;  Location: [facility] ORS;  Service:              General;  Laterality: N/A; 07/08/2020: LAPAROTOMY; N/A     Comment:  Procedure: EXPLORATORY LAPAROTOMY;  Surgeon:               Herbert Pun, MD;  Location: [facility] OR[facility];  Service:              General;  Laterality: N/A; 11/22/2019: PARTIAL COLECTOMY; N/A     Comment:  Procedure: PARTIAL COLECTOMY;  Surgeon: Herbert Pun, MD;  Location: [facility] OR[facility];  Service: General;                Laterality: N/A; 09/14/2020: PEG PLACEMENT; N/A     Comment:  Procedure: PERCUTANEOUS ENDOSCOPIC GASTROSTOMY (PEG)  PLACEMENT;  Surgeon: Benjamine Sprague, DO;  Location: [facility]               ENDOSCOPY;  Service: General;  Laterality: N/A;  TRAVEL               CASE TRACH COVID POSITIVE 07/11/2021: SYNOVIAL BIOPSY; Right     Comment:  Procedure: SYNOVIAL BIOPSY;  Surgeon: Hessie Knows, MD;              Location: [facility] ORS;  Service: Orthopedics;  Laterality:               Right; 09/13/2020: TRACHEOSTOMY TUBE PLACEMENT; N/A     Comment:  Procedure: TRACHEOSTOMY;  Surgeon: Beverly Gust, MD;              Location: [facility] ORS;  Service: ENT;  Laterality: N/A; 06/28/2020: XI ROBOTIC ASSISTED COLOSTOMY TAKEDOWN; N/A     Comment:  Procedure: XI ROBOTIC ASSISTED COLOSTOMY TAKEDOWN               CONVERTED TO OPEN PROCEDURE;  Surgeon: Herbert Pun, MD;  Location: [facility] ORS;  Service: General;                Laterality: N/A; BMI    Body Mass Index: 39.01 kg/m     Reproductive/Obstetrics negative OB ROS                             Anesthesia Physical Anesthesia Plan  ASA: 4 and emergent  Anesthesia Plan: General ETT   Post-op Pain Management:    Induction:   PONV Risk Score and Plan: 3  Airway Management Planned:   Additional Equipment:   Intra-op  Plan:   Post-operative Plan:   Informed Consent: I have reviewed the patients History and Physical, chart, labs and discussed the procedure including the risks, benefits and alternatives for the proposed anesthesia with the patient or authorized representative who has indicated his/her understanding and acceptance.     Dental Advisory Given  Plan Discussed with: CRNA  Anesthesia Plan Comments:         Anesthesia Quick Evaluation

## 2021-09-01 NOTE — Consult Note (Signed)
Rheumatology follow-up: For right knee arthroscopy and drainage today Sed rate remains elevated.  77 Pending rheumatoid factor and anti-CCP antibodies Will restage his inflammatory arthritis.   -Labs pending.   -will x-ray left hand due to persistent pain.  Decreased grip but able to flex his wrist without significant pain.  No significant MCP synovitis -Uric acid at 6.1.  Up from before.  Probably related to diuretics.   Will increase allopurinol to 400 mg a day to try to prevent any crystalline synovitis.   -Would like to keep him off of other remittive agents at this time Thank you for your care of this patient.

## 2021-09-01 NOTE — Progress Notes (Signed)
Patient seen and evaluated.  Done 2 arthroscopies in the past for septic knee on the right.  He is having persisting pain and swelling.  His cell count yesterday was suspicious for infection with the elevated neutrophil count of over 90%.  I discussed the case with Dr. Jefm Bryant his rheumatologist and he feels that it arthroscopy is advisable.  We will plan on getting synovial biopsy and synovial cultures as well as irrigation and debridement of the knee for possible recurrent infection.  Discussed with patient and his wife this morning.

## 2021-09-01 NOTE — Anesthesia Procedure Notes (Signed)
Procedure Name: Intubation Date/Time: 09/01/2021 1:28 PM Performed by: Debe Coder, CRNA Pre-anesthesia Checklist: Patient identified, Emergency Drugs available, Suction available and Patient being monitored Patient Re-evaluated:Patient Re-evaluated prior to induction Oxygen Delivery Method: Circle system utilized Preoxygenation: Pre-oxygenation with 100% oxygen Induction Type: IV induction Ventilation: Mask ventilation without difficulty Laryngoscope Size: McGraph and 4 Grade View: Grade I Tube type: Oral Tube size: 7.5 mm Number of attempts: 1 Airway Equipment and Method: Stylet and Oral airway Placement Confirmation: ETT inserted through vocal cords under direct vision, positive ETCO2 and breath sounds checked- equal and bilateral Secured at: 23 cm Tube secured with: Tape Dental Injury: Teeth and Oropharynx as per pre-operative assessment

## 2021-09-01 NOTE — Progress Notes (Signed)
OT Cancellation Note  Patient Details Name: Craig [name] MRN: [id number] DOB: Oct 30, 1990   Cancelled Treatment:    Reason Eval/Treat Not Completed: Patient at procedure or test/ unavailable. OT to re-attempt as time allows.   Darleen Crocker, MS, OTR/L , CBIS ascom [phone]  09/01/21, 12:29 PM

## 2021-09-01 NOTE — Transfer of Care (Signed)
Immediate Anesthesia Transfer of Care Note  Patient: Craig [name]  Procedure(s) Performed: ARTHROSCOPIC DEBRIDEMENT OF KNEE WITH SYNOVIUM BIOPSY (Right: Knee)  Patient Location: PACU  Anesthesia Type:General  Level of Consciousness: awake, alert  and oriented  Airway & Oxygen Therapy: Patient Spontanous Breathing and Patient connected to face mask oxygen  Post-op Assessment: Report given to RN and Post -op Vital signs reviewed and stable  Post vital signs: Reviewed and stable  Last Vitals:  Vitals Value Taken Time  BP 120/48 09/01/21 1441  Temp 36.4 C 09/01/21 1445  Pulse 114 09/01/21 1446  Resp 26 09/01/21 1446  SpO2 99 % 09/01/21 1446  Vitals shown include unvalidated device data.  Last Pain:  Vitals:   09/01/21 1200  TempSrc: Temporal  PainSc: 4       Patients Stated Pain Goal: 2 (13/14/38 8875)  Complications: No notable events documented.

## 2021-09-01 NOTE — Progress Notes (Signed)
Per Dr. Rudene Christians, okay to proceed with the procedure today even if  patient has had Xarelto yesterday afternoon.

## 2021-09-01 NOTE — Op Note (Signed)
09/01/2021  2:34 PM  PATIENT:  Craig [name]  31 y.o. male  PRE-OPERATIVE DIAGNOSIS:  INFECTED RIGHT KNEE  POST-OPERATIVE DIAGNOSIS:  INFECTED RIGHT KNEE  PROCEDURE:  Procedure(s): ARTHROSCOPIC DEBRIDEMENT OF KNEE WITH SYNOVIUM BIOPSY (Right)  SURGEON: Laurene Footman, MD  ASSISTANTS: None  ANESTHESIA:   general  EBL:  Total I/O In: -  Out: 5 [Blood:5]  BLOOD ADMINISTERED:none  DRAINS: (1) Hemovact drain(s) in the right knee with  Suction Open   LOCAL MEDICATIONS USED:  NONE  SPECIMEN: Multiple specimens with multiple tissue cultures as well as synovial biopsy for pathologic evaluation  DISPOSITION OF SPECIMEN:   Pathology and microbiology  COUNTS:  YES  TOURNIQUET:   Total Tourniquet Time Documented: Thigh (Right) - 37 minutes Total: Thigh (Right) - 37 minutes   IMPLANTS: None  DICTATION: .Dragon Dictation patient was brought to the operating room and transferred to the operative table where general anesthesia was obtained.  The left leg was placed on with a well-padded leg positioner and the right leg was prepped and draped in usual sterile fashion with a tourniquet by the upper thigh.  After patient identification and timeout procedures were completed prior inferior lateral and medial portals were opened with the arthroscope introduced showing significant degenerative changes in the suprapatellar pouch with moderate synovitis through the inferomedial portal multiple synovial culture biopsies were obtained x3 followed by synovial biopsy of inflammatory appearing synovium.  Following this the shaver was introduced and synovitis was debrided with the arthroscopic shaver this is done in the suprapatellar pouch as well as the gutters and anterior compartments.  Following this the knee was irrigated thoroughly with a total of 12 L of saline when this was completed and pictures of the knee obtained showing significant tricompartmental osteoarthritis all instrumentation  was withdrawn and a drain was placed through the inferomedial portal sutures placed followed by application of a dressing of Xeroform 4 x 4's Webril and Ace wrap with additional 4 inch Ace wraps around his feet which have significant edema.  PLAN OF CARE: Admit to inpatient   PATIENT DISPOSITION:  PACU - hemodynamically stable.

## 2021-09-01 NOTE — Progress Notes (Signed)
[facility] [place] NAME: Craig [name]    MR#:  [id number]  DATE OF BIRTH:  08-28-1990  SUBJECTIVE:  mother at bedside.   breathing better Patient remains  mainly bedbound secondary to his right knee pain NPO for arthroscopic right knee wash today REVIEW OF SYSTEMS:   Review of Systems  Constitutional:  Negative for chills, fever and weight loss.  HENT:  Negative for ear discharge, ear pain and nosebleeds.   Eyes:  Negative for blurred vision, pain and discharge.  Respiratory:  Positive for shortness of breath. Negative for sputum production, wheezing and stridor.   Cardiovascular:  Negative for chest pain, palpitations, orthopnea and PND.  Gastrointestinal:  Negative for abdominal pain, diarrhea, nausea and vomiting.  Genitourinary:  Negative for frequency and urgency.  Musculoskeletal:  Negative for back pain and joint pain.  Neurological:  Positive for weakness. Negative for sensory change, speech change and focal weakness.  Psychiatric/Behavioral:  Negative for depression and hallucinations. The patient is not nervous/anxious.   Tolerating Diet:yes Tolerating PT: home health PT OT  DRUG ALLERGIES:   Allergies  Allergen Reactions   Tramadol Hcl     SEIZURES!!! DO NOT REORDER AT DISCHARGE!!!   Fentanyl Citrate Other (See Comments)    Makes patient more agitated & delirious   Amoxicillin Rash    Tolerated cefepime and cefazolin 08/2020. Tolerated Zosyn 07/2021    VITALS:  Blood pressure 113/61, pulse (!) 112, temperature 98.2 F (36.8 C), temperature source Temporal, resp. rate 18, weight 134.1 kg, SpO2 98 %.  PHYSICAL EXAMINATION:   Physical Exam  GENERAL:  31 y.o.-year-old patient lying in the bed with no acute distress. Morbidly obese HEENT: Head atraumatic, normocephalic. Oropharynx and nasopharynx clear. [place]+ LUNGS: Normal breath sounds bilaterally, no wheezing, rales, rhonchi. No use of accessory muscles of  respiration.  CARDIOVASCULAR: S1, S2 normal. No murmurs, rubs, or gallops.  ABDOMEN: Soft, nontender, nondistended. Midline abdominal scar. Left lower quadrant colostomy present  EXTREMITIES: right knee decreased range of motion secondary to severe DJD. Tenderness over the right knee NEUROLOGIC: nonfocal PSYCHIATRIC:  patient is alert and oriented x 3.  SKIN:  Pressure Injury 07/08/21 Scrotum Bilateral Stage 1 -  Intact skin with non-blanchable redness of a localized area usually over a bony prominence. red nonblanchable on sacrum, above sacrum and scrotal sack (Active)  07/08/21   Location: Scrotum  Location Orientation: Bilateral  Staging: Stage 1 -  Intact skin with non-blanchable redness of a localized area usually over a bony prominence.  Wound Description (Comments): red nonblanchable on sacrum, above sacrum and scrotal sack  Present on Admission: Yes   LABORATORY PANEL:  CBC Recent Labs  Lab 09/01/21 0740  WBC 6.3  HGB 9.8*  HCT 33.6*  PLT 280     Chemistries  Recent Labs  Lab 08/29/21 0425 08/30/21 0047 09/01/21 0740  NA 136   < > 138  K 2.8*   < > 3.6  CL 92*   < > 90*  CO2 35*   < > 37*  GLUCOSE 143*   < > 109*  BUN <5*   < > <5*  CREATININE 0.54*   < > 0.44*  CALCIUM 9.0   < > 9.5  MG 1.9   < > 1.6*  AST 30  --   --   ALT 11  --   --   ALKPHOS 58  --   --   BILITOT 0.6  --   --    < > =  values in this interval not displayed.    Cardiac Enzymes No results for input(s): TROPONINI in the last 168 hours. RADIOLOGY:  DG Hand 2 View Left  Result Date: 09/01/2021 CLINICAL DATA:  Left hand pain. EXAM: LEFT HAND - 2 VIEW COMPARISON:  None. FINDINGS: There is no evidence of fracture or dislocation. There is no evidence of arthropathy or other focal bone abnormality. Soft tissues are unremarkable. IMPRESSION: Negative. Electronically Signed   By: Marijo Conception M.D.   On: 09/01/2021 11:51   [place] RT LOWER EXTREM LTD SOFT TISSUE NON VASCULAR  Result Date:  08/30/2021 CLINICAL DATA:  Right knee pain and swelling for 6 months. Status post right knee arthroscopy 07/18/2021 EXAM: ULTRASOUND RIGHT LOWER EXTREMITY LIMITED TECHNIQUE: Ultrasound examination of the lower extremity soft tissues was performed in the area of clinical concern. COMPARISON:  Right knee radiographs 08/28/2021 FINDINGS: Joint Space: In the area of concern adjacent to the patient's surgical incision of the anterior knee, there is soft tissue edema. There also is a hypoechoic avascular fluid collection measuring approximately 11.3 x 2.3 x 6.1 cm medial to the patella. A smaller fluid collection is seen lateral to the patella. This was not measured by the sonographer. IMPRESSION: In the area of interest around the patella, there are fluid collections adjacent to the medial greater than lateral aspects of the patella. It is unclear whether this represents fluid extending from a knee joint effusion versus separate more anterior fluid collections. Electronically Signed   By: Yvonne Kendall   On: 08/30/2021 17:23   [place] FINE NEEDLE ASP 1ST LESION  Result Date: 08/31/2021 INDICATION: Right knee pain and swelling. Recent ultrasound demonstrated a fluid collection along the anteromedial aspect of the right knee. EXAM: Ultrasound-guided aspiration of right lower extremity fluid collection MEDICATIONS: Local anesthetic, 1% lidocaine ANESTHESIA/SEDATION: None COMPLICATIONS: None immediate. PROCEDURE: Informed written consent was obtained from the patient after a thorough discussion of the procedural risks, benefits and alternatives. All questions were addressed. A timeout was performed prior to the initiation of the procedure. Ultrasound demonstrated a fluid collection along the anteromedial aspect of the right knee. The fluid collection appears to be external to the joint space. The overlying skin was prepped with chlorhexidine and sterile field was created. Initially, an 18 gauge spinal needle was directed into  the fluid collection with ultrasound guidance. Opaque brown fluid was aspirated. This needle was removed and replaced with a Yueh catheter that was placed with ultrasound guidance. Total of 60 mL of the opaque brown fluid was removed until the fluid collection was decompressed. Bandage placed over the puncture site. FINDINGS: Large fluid collection with a few septations along the medial aspect of the right knee. The fluid collection appears to be external to the joint space. 60 mL of brown opaque fluid was removed. Majority of the fluid was aspirated. IMPRESSION: Ultrasound-guided aspiration of the large fluid collection along the medial aspect of the right knee. Electronically Signed   By: Markus Daft M.D.   On: 08/31/2021 13:24   ASSESSMENT AND PLAN:  Craig [name] is a 31 y.o. 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   Acute on chronic hypoxic/hypercapnia respiratory failure present on admission acute metabolic encephalopathy-- resolved -- came in with ABG with Paco2 of 79 -- currently is on BiPAP. He is alert and oriented. -- Patient uses [facility]. He also uses chronic home oxygen. -- He follows with pulmonary Dr.Aleskerov -- continue to monitor his mentation. -- PRN bronchodilators  Right knee pain history of MSSA right knee septic arthritis -- patient is bedbound secondary to unable to bear weight on right knee -- follows with Dr. Rudene Christians -- continue overall Cefradoxil for total 14 days. Patient follows with ID Dr.  [name] --1/11-- antibiotics change to Keflex 1 g PO Q6 for one month per Dr. [name] -- will get ultrasound soft tissue right knee per Dr. Scharlene Gloss recommendation -- consider orthopedic consultation with Dr. Rudene Christians --1/12--right ant knee fluid collection aspirated by IR Dr Anselm Pancoast. --Fluid--turbid, NO crystals, WBC 36K --1/13--DR menz plans arthroscopic knee wash today. Increase allopurinol dose per Dr Jefm Bryant  history of upper extremity DVT -- continue Xarelto -- recent CT chest was negative for PE  Hypokalemia/hypomagnesimia -- pharmacy to replete -- start on PO 20 M EQ BID along with oral magnesium  type II diabetes, morbid obesity -- hemoglobin 6.8 in November 2022 -- diet control at home  morbid obesity/poor Dietary lifestyle --Dietitian to see pt for education -- complicates prognosis  Physical therapy .-- Recommends home health TOC for discharge planning.   Family communication : mother at bedside Consults : ID, rheumatology CODE STATUS: full DVT Prophylaxis : Xarelto Level of care: Telemetry Cardiac Status is: Inpatient  Remains inpatient appropriate because: acute on chronic hypoxic hypercapnia respiratory failure.Right knee pain  Anticipate discharge 1 to 3 days if continues to show improvement.        TOTAL TIME TAKING CARE OF THIS PATIENT: 20 minutes.  >50% time spent on counselling and coordination of care  Note: This dictation was prepared with Dragon dictation along with smaller phrase technology. Any transcriptional errors that result from this process are unintentional.  Fritzi Mandes M.D    [facility]   CC: Primary care physician; [name], MD [name], male   DOB: 13-Dec-1990, 31 y.o.   MRN: [id number]

## 2021-09-01 NOTE — Progress Notes (Signed)
PT Cancellation Note  Patient Details Name: Craig [name] MRN: [id number] DOB: Jan 10, 1991   Cancelled Treatment:    Reason Eval/Treat Not Completed: Patient at procedure or test/unavailable. PT to re-attempt as able.    Lieutenant Diego PT, DPT 12:17 PM,09/01/21

## 2021-09-01 NOTE — Progress Notes (Signed)
Date of Admission:  08/28/2021     ID: Craig [name] is a 31 y.o. male  Principal Problem:   AMS (altered mental status) Active Problems:   Hypertension   Acute on chronic respiratory failure with hypoxemia (HCC)   Electrolyte abnormality   DM II (diabetes mellitus, type II), controlled (HCC)    Subjective: Pt underwent debridement of the rt knee today by Dr.Menz In pain'   Medications:   acidophilus  2 capsule Oral Daily   allopurinol  100 mg Oral Once   [START ON 09/02/2021] allopurinol  400 mg Oral Daily   budesonide  0.25 mg Nebulization Daily   cephALEXin  1,000 mg Oral Q6H   cholecalciferol  1,000 Units Oral Daily   doxazosin  1 mg Oral Daily   DULoxetine  30 mg Oral Daily   feeding supplement  237 mL Oral BID BM   feeding supplement (KATE FARMS STANDARD 1.4)  325 mL Oral BID BM   folic acid  1 mg Oral Daily   furosemide  40 mg Oral Daily   HYDROcodone-acetaminophen       insulin aspart  0-15 Units Subcutaneous TID WC   insulin aspart  0-5 Units Subcutaneous QHS   magnesium oxide  400 mg Oral BID   metoprolol succinate  150 mg Oral Daily   multivitamin with minerals  1 tablet Oral Daily   nicotine  21 mg Transdermal Daily   potassium chloride  20 mEq Oral BID   pregabalin  200 mg Oral BID   rivaroxaban  20 mg Oral Q supper   sodium chloride flush  3 mL Intravenous Q12H    Objective: Vital signs in last 24 hours: Temp:  [97 F (36.1 C)-99.9 F (37.7 C)] 97 F (36.1 C) (01/13 1530) Pulse Rate:  [104-124] 109 (01/13 1530) Resp:  [14-29] 27 (01/13 1530) BP: (102-134)/(54-75) 116/54 (01/13 1530) SpO2:  [88 %-100 %] 94 % (01/13 1530)  PHYSICAL EXAM:  General: Alert, cooperative, tachypnea  Lungs: b/l air entry Heart: Tachycardia Abdomen: Soft, colostomy Extremities:rt knee surgical dressing Lymph: Cervical, supraclavicular normal. Neurologic: Grossly non-focal  Lab Results Recent Labs    08/31/21 0638 09/01/21 0740  WBC  --  6.3  HGB  --   9.8*  HCT  --  33.6*  NA 138 138  K 3.5 3.6  CL 94* 90*  CO2 35* 37*  BUN <5* <5*  CREATININE 0.34* 0.44*   Liver Panel No results for input(s): PROT, ALBUMIN, AST, ALT, ALKPHOS, BILITOT, BILIDIR, IBILI in the last 72 hours. Synovial fluid 36K wbc -91% Neutrohils  Microbiology: BC ng Studies/Results: DG Hand 2 View Left  Result Date: 09/01/2021 CLINICAL DATA:  Left hand pain. EXAM: LEFT HAND - 2 VIEW COMPARISON:  None. FINDINGS: There is no evidence of fracture or dislocation. There is no evidence of arthropathy or other focal bone abnormality. Soft tissues are unremarkable. IMPRESSION: Negative. Electronically Signed   By: Marijo Conception M.D.   On: 09/01/2021 11:51   [place] RT LOWER EXTREM LTD SOFT TISSUE NON VASCULAR  Result Date: 08/30/2021 CLINICAL DATA:  Right knee pain and swelling for 6 months. Status post right knee arthroscopy 07/18/2021 EXAM: ULTRASOUND RIGHT LOWER EXTREMITY LIMITED TECHNIQUE: Ultrasound examination of the lower extremity soft tissues was performed in the area of clinical concern. COMPARISON:  Right knee radiographs 08/28/2021 FINDINGS: Joint Space: In the area of concern adjacent to the patient's surgical incision of the anterior knee, there is soft tissue edema. There  also is a hypoechoic avascular fluid collection measuring approximately 11.3 x 2.3 x 6.1 cm medial to the patella. A smaller fluid collection is seen lateral to the patella. This was not measured by the sonographer. IMPRESSION: In the area of interest around the patella, there are fluid collections adjacent to the medial greater than lateral aspects of the patella. It is unclear whether this represents fluid extending from a knee joint effusion versus separate more anterior fluid collections. Electronically Signed   By: Yvonne Kendall   On: 08/30/2021 17:23   [place] FINE NEEDLE ASP 1ST LESION  Result Date: 08/31/2021 INDICATION: Right knee pain and swelling. Recent ultrasound demonstrated a fluid  collection along the anteromedial aspect of the right knee. EXAM: Ultrasound-guided aspiration of right lower extremity fluid collection MEDICATIONS: Local anesthetic, 1% lidocaine ANESTHESIA/SEDATION: None COMPLICATIONS: None immediate. PROCEDURE: Informed written consent was obtained from the patient after a thorough discussion of the procedural risks, benefits and alternatives. All questions were addressed. A timeout was performed prior to the initiation of the procedure. Ultrasound demonstrated a fluid collection along the anteromedial aspect of the right knee. The fluid collection appears to be external to the joint space. The overlying skin was prepped with chlorhexidine and sterile field was created. Initially, an 18 gauge spinal needle was directed into the fluid collection with ultrasound guidance. Opaque brown fluid was aspirated. This needle was removed and replaced with a Yueh catheter that was placed with ultrasound guidance. Total of 60 mL of the opaque brown fluid was removed until the fluid collection was decompressed. Bandage placed over the puncture site. FINDINGS: Large fluid collection with a few septations along the medial aspect of the right knee. The fluid collection appears to be external to the joint space. 60 mL of brown opaque fluid was removed. Majority of the fluid was aspirated. IMPRESSION: Ultrasound-guided aspiration of the large fluid collection along the medial aspect of the right knee. Electronically Signed   By: Markus Daft M.D.   On: 08/31/2021 13:24     Assessment/Plan:  ?Acute hypoxic/hypercarbic  resp failure ?multifactorial COVID related lung disease OSA Critical illness polyneuropathy ( is this affecting his resp/intercostal muscles ? Obesity hypoventilation syndrome Pt has BIPAP   Musculoskeletal disorder -due to critical illness polyneuropathy, long covid  especially rt knee and left shoulder and inability to walk ? Inflammatory arthritis   Recent past h/o  Rt septic knee arthritis due to MSSA Yesterday had aspiration and culture neg so far Today had arthroscopic /debridement- cultures sent    Recent H/o MSSA bacteremia Received 4 weeks of IV cefazolin which he completed on 08/10/21  currently On keflex 1 gram Po Q6 hrly  Blood culture neg this admission   Tachycardia ? Autonomic component TEE 07/12/21 no vegetation EF 60-65%   GOUT   Discussed the management with patient ,and his dad ID will follow him peripherally this weekend- call if needed

## 2021-09-02 DIAGNOSIS — I10 Essential (primary) hypertension: Secondary | ICD-10-CM

## 2021-09-02 LAB — BASIC METABOLIC PANEL
Anion gap: 9 (ref 5–15)
BUN: 7 mg/dL (ref 6–20)
CO2: 37 mmol/L — ABNORMAL HIGH (ref 22–32)
Calcium: 8.6 mg/dL — ABNORMAL LOW (ref 8.9–10.3)
Chloride: 93 mmol/L — ABNORMAL LOW (ref 98–111)
Creatinine, Ser: 0.45 mg/dL — ABNORMAL LOW (ref 0.61–1.24)
GFR, Estimated: >60 mL/min (ref >60)
Glucose, Bld: 196 mg/dL — ABNORMAL HIGH (ref 70–99)
Potassium: 4.2 mmol/L (ref 3.5–5.1)
Sodium: 139 mmol/L (ref 135–145)

## 2021-09-02 LAB — CULTURE, BLOOD (ROUTINE X 2)
Culture: NO GROWTH
Culture: NO GROWTH
Special Requests: ADEQUATE

## 2021-09-02 LAB — RHEUMATOID FACTOR: Rheumatoid fact SerPl-aCnc: <10 IU/mL (ref <14.0)

## 2021-09-02 LAB — GLUCOSE, CAPILLARY
Glucose-Capillary: 212 mg/dL — ABNORMAL HIGH (ref 70–99)
Glucose-Capillary: 219 mg/dL — ABNORMAL HIGH (ref 70–99)
Glucose-Capillary: 261 mg/dL — ABNORMAL HIGH (ref 70–99)
Glucose-Capillary: 195 mg/dL — ABNORMAL HIGH (ref 70–99)

## 2021-09-02 LAB — MAGNESIUM: Magnesium: 1.9 mg/dL (ref 1.7–2.4)

## 2021-09-02 NOTE — Progress Notes (Signed)
[facility] [place] NAME: Craig [name]    MR#:  [id number]  DATE OF BIRTH:  09/20/1990  SUBJECTIVE:  wife at bedside.   breathing better S/p right knee arthroscopy REVIEW OF SYSTEMS:   Review of Systems  Constitutional:  Negative for chills, fever and weight loss.  HENT:  Negative for ear discharge, ear pain and nosebleeds.   Eyes:  Negative for blurred vision, pain and discharge.  Respiratory:  Positive for shortness of breath. Negative for sputum production, wheezing and stridor.   Cardiovascular:  Negative for chest pain, palpitations, orthopnea and PND.  Gastrointestinal:  Negative for abdominal pain, diarrhea, nausea and vomiting.  Genitourinary:  Negative for frequency and urgency.  Musculoskeletal:  Negative for back pain and joint pain.  Neurological:  Positive for weakness. Negative for sensory change, speech change and focal weakness.  Psychiatric/Behavioral:  Negative for depression and hallucinations. The patient is not nervous/anxious.   Tolerating Diet:yes Tolerating PT: home health PT OT  DRUG ALLERGIES:   Allergies  Allergen Reactions   Tramadol Hcl     SEIZURES!!! DO NOT REORDER AT DISCHARGE!!!   Fentanyl Citrate Other (See Comments)    Makes patient more agitated & delirious   Amoxicillin Rash    Tolerated cefepime and cefazolin 08/2020. Tolerated Zosyn 07/2021    VITALS:  Blood pressure 130/64, pulse (!) 107, temperature 98 F (36.7 C), temperature source Oral, resp. rate 18, weight 134.1 kg, SpO2 100 %.  PHYSICAL EXAMINATION:   Physical Exam  GENERAL:  31 y.o.-year-old patient lying in the bed with no acute distress. Morbidly obese HEENT: Head atraumatic, normocephalic. Oropharynx and nasopharynx clear. [place]+ LUNGS: Normal breath sounds bilaterally, no wheezing, rales, rhonchi. No use of accessory muscles of respiration.  CARDIOVASCULAR: S1, S2 normal. No murmurs, rubs, or gallops.  ABDOMEN: Soft,  nontender, nondistended. Midline abdominal scar. Left lower quadrant colostomy present  EXTREMITIES: right knee decreased range of motion secondary to severe DJD. Right knee drain + NEUROLOGIC: nonfocal PSYCHIATRIC:  patient is alert and oriented x 3.  SKIN:  Pressure Injury 07/08/21 Scrotum Bilateral Stage 1 -  Intact skin with non-blanchable redness of a localized area usually over a bony prominence. red nonblanchable on sacrum, above sacrum and scrotal sack (Active)  07/08/21   Location: Scrotum  Location Orientation: Bilateral  Staging: Stage 1 -  Intact skin with non-blanchable redness of a localized area usually over a bony prominence.  Wound Description (Comments): red nonblanchable on sacrum, above sacrum and scrotal sack  Present on Admission: Yes   LABORATORY PANEL:  CBC Recent Labs  Lab 09/01/21 0740  WBC 6.3  HGB 9.8*  HCT 33.6*  PLT 280     Chemistries  Recent Labs  Lab 08/29/21 0425 08/30/21 0047 09/02/21 0436  NA 136   < > 139  K 2.8*   < > 4.2  CL 92*   < > 93*  CO2 35*   < > 37*  GLUCOSE 143*   < > 196*  BUN <5*   < > 7  CREATININE 0.54*   < > 0.45*  CALCIUM 9.0   < > 8.6*  MG 1.9   < > 1.9  AST 30  --   --   ALT 11  --   --   ALKPHOS 58  --   --   BILITOT 0.6  --   --    < > = values in this interval not displayed.  Cardiac Enzymes No results for input(s): TROPONINI in the last 168 hours. RADIOLOGY:  DG Hand 2 View Left  Result Date: 09/01/2021 CLINICAL DATA:  Left hand pain. EXAM: LEFT HAND - 2 VIEW COMPARISON:  None. FINDINGS: There is no evidence of fracture or dislocation. There is no evidence of arthropathy or other focal bone abnormality. Soft tissues are unremarkable. IMPRESSION: Negative. Electronically Signed   By: Marijo Conception M.D.   On: 09/01/2021 11:51   ASSESSMENT AND PLAN:  Craig [name] is a 31 y.o. 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   Acute on chronic hypoxic/hypercapnia respiratory failure present on admission acute metabolic encephalopathy-- resolved -- came in with ABG with Paco2 of 79 -- currently is on BiPAP. He is alert and oriented. -- Patient uses [facility]. He also uses chronic home oxygen. -- He follows with pulmonary Dr.Aleskerov -- continue to monitor his mentation. -- PRN bronchodilators  Right knee pain history of MSSA right knee septic arthritis -- patient is bedbound secondary to unable to bear weight on right knee -- follows with Dr. Rudene Christians -- continue overall Cefradoxil for total 14 days. Patient follows with ID Dr. [name] --1/11-- antibiotics change to Keflex 1 g PO Q6 for one month per Dr. [name] -- will get ultrasound soft tissue right knee per Dr. Scharlene Gloss recommendation -- consider orthopedic consultation with Dr. Rudene Christians --1/12--right ant knee fluid collection aspirated by IR Dr Anselm Pancoast. --Fluid--turbid, NO crystals, WBC 36K --1/13--DR menz plans arthroscopic knee wash today. Increase allopurinol dose per Dr Jefm Bryant --1/14-- overall improving. Right knee drain +  history of upper extremity DVT -- continue Xarelto -- recent CT chest was negative for PE  Hypokalemia/hypomagnesimia -- pharmacy to replete -- start on PO 20 M EQ BID along with oral magnesium  type II diabetes, morbid obesity -- hemoglobin 6.8 in November 2022 -- diet control at home  morbid obesity/poor Dietary lifestyle --Dietitian to see pt for education -- complicates prognosis  Physical therapy .--  Recommends home health TOC for discharge planning.   Family communication : wife at bedside Consults : ID, rheumatology CODE STATUS: full DVT Prophylaxis : Xarelto Level of care: Telemetry Cardiac Status is: Inpatient  Remains inpatient appropriate because: acute on chronic hypoxic hypercapnia respiratory failure.Right knee pain  Anticipate discharge 1 to 2 days once right drain is removed   TOTAL TIME TAKING CARE OF THIS PATIENT: 20 minutes.  >50% time spent on counselling and coordination of care  Note: This dictation was prepared with Dragon dictation along with smaller phrase technology. Any transcriptional errors that result from this process are unintentional.  Fritzi Mandes M.D    [facility]   CC: Primary care physician; [name], MD [name], male   DOB: 11-28-1990, 31 y.o.   MRN: [id number]

## 2021-09-02 NOTE — Progress Notes (Signed)
PHARMACY CONSULT NOTE  Pharmacy Consult for Electrolyte Monitoring and Replacement   Recent Labs: Potassium (mmol/L)  Date Value  09/02/2021 4.2   Magnesium (mg/dL)  Date Value  09/02/2021 1.9   Calcium (mg/dL)  Date Value  09/02/2021 8.6 (L)   Albumin (g/dL)  Date Value  08/29/2021 2.8 (L)   Phosphorus (mg/dL)  Date Value  08/31/2021 5.1 (H)   Sodium (mmol/L)  Date Value  09/02/2021 139   Assessment: Patient is a 31 y/o M with medical history including inflammatory arthritis on methotrexate and chronic steroids, history of tracheostomy, chronic respiratory failure on 2L oxygen at baseline, history of DVT on Xarelto, history of diverticulitis s/p colectomy and ostomy, history of MSSA bacteremia and pneumonia who is admitted with altered mental status. Pharmacy consulted to assist with electrolyte monitoring and replacement as indicated.  Diuresis: PO Lasix 40 mg daily Standing electrolyte replacement: Kcl 20 mEq BID + MagOx 400 mg BID  Goal of Therapy:  Electrolytes within normal limits  Plan:  -No electrolyte replacement at this time F/u electrolytes with AM labs.   Billy Rocco A 09/02/2021 11:37 AM

## 2021-09-03 DIAGNOSIS — E878 Other disorders of electrolyte and fluid balance, not elsewhere classified: Secondary | ICD-10-CM

## 2021-09-03 LAB — BODY FLUID CULTURE W GRAM STAIN: Culture: NO GROWTH

## 2021-09-03 LAB — GLUCOSE, CAPILLARY: Glucose-Capillary: 105 mg/dL — ABNORMAL HIGH (ref 70–99)

## 2021-09-03 LAB — CYCLIC CITRUL PEPTIDE ANTIBODY, IGG/IGA: CCP Antibodies IgG/IgA: <1 units (ref 0–19)

## 2021-09-03 MED ORDER — POTASSIUM CHLORIDE CRYS ER 20 MEQ PO TBCR: 20.0000 meq | EXTENDED_RELEASE_TABLET | Freq: Two times a day (BID) | ORAL | 1 refills | Status: AC

## 2021-09-03 MED ORDER — CEPHALEXIN 500 MG PO CAPS: 1000.0000 mg | ORAL_CAPSULE | Freq: Four times a day (QID) | ORAL | 0 refills | Status: AC

## 2021-09-03 MED ORDER — HYDROCODONE-ACETAMINOPHEN 7.5-325 MG PO TABS: 1.0000 | ORAL_TABLET | Freq: Four times a day (QID) | ORAL | 0 refills | Status: DC | PRN

## 2021-09-03 MED ORDER — RISAQUAD PO CAPS: 2.0000 | ORAL_CAPSULE | Freq: Every day | ORAL | 0 refills | Status: AC

## 2021-09-03 MED ORDER — METOPROLOL SUCCINATE ER 50 MG PO TB24: 150.0000 mg | ORAL_TABLET | Freq: Every day | ORAL | 1 refills | Status: AC

## 2021-09-03 MED ORDER — MAGNESIUM OXIDE -MG SUPPLEMENT 400 (240 MG) MG PO TABS: 400.0000 mg | ORAL_TABLET | Freq: Two times a day (BID) | ORAL | 0 refills | Status: DC

## 2021-09-03 NOTE — TOC Progression Note (Signed)
Transition of Care [facility]) - Progression Note    Patient Details  Name: Craig [name] MRN: [id number] Date of Birth: [birth year]  Transition of Care [facility]) CM/SW [place], [place] Phone Number: 09/03/2021, 12:34 PM  Clinical Narrative:     CSW has called multiple phone numbers with [facility], have identified patient is out of [facility] office and notified them that he is discharging today to resume services [phone]). Home Health orders for PT OT and RN are ordered.   CSW to arrange for EMS transport home. NO further discharge needs identified.    Expected Discharge Plan: [place] Barriers to Discharge: Continued Medical Work up  Expected Discharge Plan and Services Expected Discharge Plan: [place] Choice: [place], Resumption of Svcs/PTA Provider Living arrangements for the past 2 months: Single Family Home Expected Discharge Date: 09/03/21               DME Arranged: N/A DME Agency: NA       HH Arranged: PT, OT HH Agency: [place] Date HH Agency Contacted: 08/30/21 Time [place]: [id number] Representative spoke with at [place]: [place] ([place]) Interventions    Readmission Risk Interventions Readmission Risk Prevention Plan 08/30/2021 07/10/2021  Transportation Screening Complete Complete  PCP or Specialist Appt within 3-5 Days - Complete  HRI or [place] - Complete  Social Work Consult for [place] Planning/Counseling - Complete  Palliative Care Screening - Not Applicable  Medication Review Press photographer) Complete Complete  PCP or Specialist appointment within 3-5 days of discharge Complete -  [place] or Home Care Consult Complete -  SW Recovery Care/Counseling Consult Complete -  Palliative Care Screening Not Applicable -  [place] Not Applicable -  Some recent data might be hidden

## 2021-09-03 NOTE — Progress Notes (Signed)
Discharge teaching complete. Meds, diet, activity, follow up appointments reviewed and all questions answered. Copy of instructions given to patient and prescriptions sent to pharmacy. Patient discharged via EMS with wife at bedside.

## 2021-09-03 NOTE — Progress Notes (Signed)
°  Subjective: 2 Days Post-Op Procedure(s) (LRB): ARTHROSCOPIC DEBRIDEMENT OF KNEE WITH SYNOVIUM BIOPSY (Right) Patient reports pain as well-controlled.   Patient is well, and has had no acute complaints or problems Plan is to go Home after hospital stay. Negative for chest pain and shortness of breath   Objective: Vital signs in last 24 hours: Temp:  [98 F (36.7 C)-98.4 F (36.9 C)] 98.3 F (36.8 C) (01/15 0745) Pulse Rate:  [104-120] 105 (01/15 0815) Resp:  [18-20] 19 (01/15 0815) BP: (122-154)/(63-86) 127/64 (01/15 0745) SpO2:  [96 %-100 %] 98 % (01/15 0815) FiO2 (%):  [40 %] 40 % (01/15 0815)  Intake/Output from previous day:  Intake/Output Summary (Last 24 hours) at 09/03/2021 1056 Last data filed at 09/02/2021 1813 Gross per 24 hour  Intake 480 ml  Output 800 ml  Net -320 ml    Intake/Output this shift: No intake/output data recorded.  Labs: Recent Labs    09/01/21 0740  HGB 9.8*   Recent Labs    09/01/21 0740  WBC 6.3  RBC 3.73*  HCT 33.6*  PLT 280   Recent Labs    09/01/21 0740 09/02/21 0436  NA 138 139  K 3.6 4.2  CL 90* 93*  CO2 37* 37*  BUN <5* 7  CREATININE 0.44* 0.45*  GLUCOSE 109* 196*  CALCIUM 9.5 8.6*   No results for input(s): LABPT, INR in the last 72 hours.   EXAM General - Patient is Alert, Appropriate, and Oriented Extremity - Neurovascular intact-sensation decreased to light touch but this is patient's baseline Dorsiflexion/Plantar flexion intact Compartment soft Dressing/Incision -Postoperative dressing remains in place, Hemovac in place, No significant drainage from previous. ACE wraps to bilateral lower legs intact.  Motor Function - intact, moving foot and toes well on exam.     Assessment/Plan: 2 Days Post-Op Procedure(s) (LRB): ARTHROSCOPIC DEBRIDEMENT OF KNEE WITH SYNOVIUM BIOPSY (Right) Principal Problem:   AMS (altered mental status) Active Problems:   Hypertension   Acute on chronic respiratory failure with  hypoxemia (HCC)   Electrolyte abnormality   DM II (diabetes mellitus, type II), controlled (HCC)  Estimated body mass index is 39.01 kg/m as calculated from the following:   Height as of 08/14/21: 6' 0.99" (1.854 m).   Weight as of this encounter: 134.1 kg.  Hemovac removed, fresh dressings applied over portal sites. No significant erythema noted, mild active bloody drainage from medial portal site following drain removal.  No growth from cultures so far.   Spoke with medicine, plan to discharge later today.  Patient is to f/u with Rachelle Hora in 1 week for suture removal.   DVT Prophylaxis - Xarelto Weight-Bearing as tolerated to right leg  Cassell Smiles, PA-C [facility] Orthopaedic Surgery 09/03/2021, 10:56 AM

## 2021-09-03 NOTE — Discharge Summary (Signed)
[place] [facility] [place] NAME: Craig [name]    MR#:  [id number]  DATE OF BIRTH:  Dec 18, 1990  DATE OF ADMISSION:  08/28/2021 ADMITTING PHYSICIAN: Fritzi Mandes, MD  DATE OF DISCHARGE: 09/03/2021  PRIMARY CARE PHYSICIAN: [name], MD    ADMISSION DIAGNOSIS:  Hypokalemia [E87.6] Hypomagnesemia [E83.42] Pain [R52] Knee pain, right [M25.561] Sepsis ([place]) [A41.9] Chronic pain of right knee [M25.561, G89.29] Acute on chronic respiratory failure with hypoxemia (HCC) [J96.21] AMS (altered mental status) [R41.82]  DISCHARGE DIAGNOSIS:  Acute on chronic hypoxic respiratory failure with metabolic encephalopathy improved Chronic respiratory failure on chronic home oxygen and CPAP Right knee pain chronic with history of right knee septic arthritis status post right anterior knee fluid aspiration with arthroscopic knee wash out  SECONDARY DIAGNOSIS:   Past Medical History:  Diagnosis Date   Gout    Hypertension    Rupture of bowel [facility])     HOSPITAL COURSE:   Craig [name] is a 31 y.o. 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   Acute on chronic hypoxic respiratory failure present on admission acute metabolic encephalopathy-- resolved -- came in with ABG with Paco2 of 79 -- currently is on BiPAP. He is alert and oriented. -- Patient uses [facility] at home. He also uses chronic home oxygen. -- He follows with pulmonary Dr.Aleskerov -- continue to monitor his  mentation. -- PRN bronchodilators   Right knee pain history of MSSA right knee septic arthritis -- patient is bedbound secondary to unable to bear weight on right knee -- follows with Dr. Rudene Christians -- continue overall Cefradoxil for total 14 days. Patient follows with ID Dr. [name] --1/11-- antibiotics change to Keflex 1 g PO Q6 for one month per Dr. [name] -- will get ultrasound soft tissue right knee per Dr. Scharlene Gloss recommendation -- consider orthopedic consultation with Dr. Rudene Christians --1/12--right ant knee fluid collection aspirated by IR Dr Anselm Pancoast. --Fluid--turbid, NO crystals, WBC 36K --1/13--DR menz plans arthroscopic knee wash today. Increase allopurinol dose per Dr Jefm Bryant --1/14-- overall improving. Right knee drain + --1/15-- right knee drain removed per orthopedic Dr. Loyal Gambler. Patient will follow-up orthopedic in one week as outpatient for suture removal.   history of upper extremity DVT -- continue Xarelto -- recent CT chest was negative for PE   Hypokalemia/hypomagnesimia -- pharmacy to replete -- start on PO 20 M EQ BID along with oral magnesium   type II diabetes, morbid obesity -- hemoglobin 6.8 in November 2022 -- diet control at home   morbid obesity/poor Dietary lifestyle --Dietitian to see pt for education -- complicates prognosis   Physical therapy-- Recommends home health     Family communication : wife at bedside Consults : ID, rheumatology CODE STATUS: full DVT Prophylaxis : Xarelto Level of care: Telemetry Cardiac Status is: Inpatient   will discharge to put patient to home with resumption of home health services. This was discussed with patient's wife at bedside. Wife will make follow-up appointment with orthopedic, ID, PCP and rheumatology Dr. Jefm Bryant CONSULTS OBTAINED:  Treatment Team:  Hessie Knows, MD  DRUG ALLERGIES:   Allergies  Allergen Reactions   Tramadol Hcl     SEIZURES!!! DO NOT REORDER AT DISCHARGE!!!   Fentanyl Citrate  Other (See Comments)    Makes patient more agitated & delirious   Amoxicillin Rash    Tolerated cefepime and cefazolin 08/2020. Tolerated Zosyn 07/2021    DISCHARGE MEDICATIONS:   Allergies as of 09/03/2021       Reactions   Tramadol Hcl    SEIZURES!!! DO NOT REORDER AT DISCHARGE!!!   Fentanyl Citrate Other (See Comments)   Makes patient more agitated & delirious   Amoxicillin Rash   Tolerated cefepime and cefazolin 08/2020. Tolerated Zosyn 07/2021        Medication List     STOP taking these medications    albuterol 1.25 MG/3ML nebulizer solution Commonly known as: ACCUNEB   cefadroxil 1 g tablet Commonly known as: DURICEF   lisinopril 5 MG tablet Commonly known as: ZESTRIL   methotrexate 2.5 MG tablet Commonly known as: RHEUMATREX   Potassium Chloride ER 20 MEQ Tbcr   valproic acid 250 MG capsule Commonly known as: Depakene       TAKE these medications    acetaminophen 650 MG CR tablet Commonly known as: TYLENOL Take 650 mg by mouth every 8 (eight) hours as needed.   acidophilus Caps capsule Take 2 capsules by mouth daily for 25 days. Start taking on: September 04, 2021   allopurinol 300 MG tablet Commonly known as: ZYLOPRIM Take 300 mg by mouth daily.   ascorbic acid 500 MG tablet Commonly known as: VITAMIN C Take 1 tablet (500 mg total) by mouth 2 (two) times daily. What changed:  how much to take when to take this   blood glucose meter kit and supplies Kit Dispense based on patient and insurance preference. Use up to four times daily as directed.   budesonide 0.25 MG/2ML nebulizer solution Commonly known as: PULMICORT Take 0.25 mg by nebulization 2 (two) times daily.   cephALEXin 500 MG capsule Commonly known as: KEFLEX Take 2 capsules (1,000 mg total) by mouth 4 (four) times daily for 26 days.   cetirizine 10 MG tablet Commonly known as: ZYRTEC Take 10 mg by mouth daily.   clotrimazole 1 % cream Commonly known as: LOTRIMIN Apply  topically 2 (two) times daily.   [employer] Saline Nasal Spray 0.65 % nasal spray Generic drug: sodium chloride SMARTSIG:1 Spray(s) Both Nares Every 4 Hours PRN   docusate sodium 100 MG capsule Commonly known as: COLACE Take 1 capsule (100 mg total) by mouth 2 (two) times daily as needed for mild constipation.   doxazosin 1 MG tablet Commonly known as: CARDURA Take 1 mg by mouth daily.   DULoxetine 60 MG capsule Commonly known as: CYMBALTA Take 60 mg by mouth daily.   famotidine 20 MG tablet Commonly known as: PEPCID Take 20 mg by mouth 2 (two) times daily as needed.   folic acid 1 MG tablet Commonly known as: FOLVITE Take 1 mg by mouth daily.   furosemide 40 MG tablet Commonly known as: LASIX Take 1 tablet (40 mg total) by mouth 2 (two) times daily. What changed: when to take this   HYDROcodone-acetaminophen 7.5-325 MG tablet Commonly known as: NORCO Take 1 tablet by mouth every 6 (six) hours as needed for moderate pain.   lidocaine 5 % Commonly known as: LIDODERM 1 patch daily as needed.   magnesium oxide 400 (240 Mg) MG tablet Commonly known as: MAG-OX Take 1 tablet (400 mg total) by mouth 2 (two)  times daily.   melatonin 3 MG Tabs tablet Take 6 mg by mouth at bedtime as needed.   metoprolol succinate 50 MG 24 hr tablet Commonly known as: TOPROL-XL Take 3 tablets (150 mg total) by mouth daily. Take with or immediately following a meal. Start taking on: September 04, 2021 What changed:  medication strength how much to take additional instructions   multivitamin with minerals tablet Take 1 tablet by mouth daily.   nicotine 21 mg/24hr patch Commonly known as: NICODERM CQ - dosed in mg/24 hours Place 1 patch (21 mg total) onto the skin daily. What changed:  when to take this reasons to take this additional instructions   nystatin powder Commonly known as: MYCOSTATIN/NYSTOP Apply topically 3 (three) times daily. What changed:  when to take this reasons to  take this   ondansetron 4 MG tablet Commonly known as: ZOFRAN Take 4 mg by mouth every 8 (eight) hours as needed.   polyethylene glycol 17 g packet Commonly known as: MIRALAX / GLYCOLAX Take 17 g by mouth daily.   potassium chloride SA 20 MEQ tablet Commonly known as: KLOR-CON M Take 1 tablet (20 mEq total) by mouth 2 (two) times daily.   pregabalin 200 MG capsule Commonly known as: LYRICA Take 200 mg by mouth 2 (two) times daily.   rivaroxaban 20 MG Tabs tablet Commonly known as: XARELTO Take 20 mg by mouth daily with supper.   Vitamin D3 25 MCG (1000 UT) Caps Take 1 capsule by mouth daily.               Discharge Care Instructions  (From admission, onward)           Start     Ordered   09/03/21 0000  Discharge wound care:       Comments: Keep right knee area dry   09/03/21 1212            If you experience worsening of your admission symptoms, develop shortness of breath, life threatening emergency, suicidal or homicidal thoughts you must seek medical attention immediately by calling 911 or calling your MD immediately  if symptoms less severe.  You Must read complete instructions/literature along with all the possible adverse reactions/side effects for all the Medicines you take and that have been prescribed to you. Take any new Medicines after you have completely understood and accept all the possible adverse reactions/side effects.   Please note  You were cared for by a hospitalist during your hospital stay. If you have any questions about your discharge medications or the care you received while you were in the hospital after you are discharged, you can call the unit and asked to speak with the hospitalist on call if the hospitalist that took care of you is not available. Once you are discharged, your primary care physician will handle any further medical issues. Please note that NO REFILLS for any discharge medications will be authorized once you are  discharged, as it is imperative that you return to your primary care physician (or establish a relationship with a primary care physician if you do not have one) for your aftercare needs so that they can reassess your need for medications and monitor your lab values. Today   SUBJECTIVE   No new complaints  VITAL SIGNS:  Blood pressure 127/64, pulse (!) 105, temperature 98.3 F (36.8 C), resp. rate 19, weight 134.1 kg, SpO2 98 %.  I/O:   Intake/Output Summary (Last 24 hours) at 09/03/2021 1215 Last  data filed at 09/02/2021 1813 Gross per 24 hour  Intake 480 ml  Output 800 ml  Net -320 ml    PHYSICAL EXAMINATION:  GENERAL:  31 y.o.-year-old patient lying in the bed with no acute distress. Morbidly obese HEENT: Head atraumatic, normocephalic. Oropharynx and nasopharynx clear. Hendrum+ LUNGS: Normal breath sounds bilaterally, no wheezing, rales, rhonchi. CARDIOVASCULAR: S1, S2 normal. No murmurs, rubs, or gallops.  ABDOMEN: Soft, nontender, nondistended. Midline abdominal scar. Left lower quadrant colostomy present  EXTREMITIES: right knee decreased range of motion secondary to severe DJD.  NEUROLOGIC: nonfocal PSYCHIATRIC:  patient is alert and oriented x 3.  SKIN:  Pressure Injury 07/08/21 Scrotum Bilateral Stage 1 -  Intact skin with non-blanchable redness of a localized area usually over a bony prominence. red nonblanchable on sacrum, above sacrum and scrotal sack (Active)  07/08/21   Location: Scrotum  Location Orientation: Bilateral  Staging: Stage 1 -  Intact skin with non-blanchable redness of a localized area usually over a bony prominence.  Wound Description (Comments): red nonblanchable on sacrum, above sacrum and scrotal sack  Present on Admission: Yes   DATA REVIEW:   CBC  Recent Labs  Lab 09/01/21 0740  WBC 6.3  HGB 9.8*  HCT 33.6*  PLT 280    Chemistries  Recent Labs  Lab 08/29/21 0425 08/30/21 0047 09/02/21 0436  NA 136   < > 139  K 2.8*   < > 4.2  CL  92*   < > 93*  CO2 35*   < > 37*  GLUCOSE 143*   < > 196*  BUN <5*   < > 7  CREATININE 0.54*   < > 0.45*  CALCIUM 9.0   < > 8.6*  MG 1.9   < > 1.9  AST 30  --   --   ALT 11  --   --   ALKPHOS 58  --   --   BILITOT 0.6  --   --    < > = values in this interval not displayed.    Microbiology Results   Recent Results (from the past 240 hour(s))  Culture, blood (Routine x 2)     Status: None   Collection Time: 08/28/21  5:52 PM   Specimen: BLOOD  Result Value Ref Range Status   Specimen Description BLOOD BLOOD RIGHT WRIST  Final   Special Requests   Final    BOTTLES DRAWN AEROBIC AND ANAEROBIC Blood Culture adequate volume   Culture   Final    NO GROWTH 5 DAYS Performed at [facility], [address] [place], [place] [address]    Report Status 09/02/2021 FINAL  Final  Resp Panel by RT-PCR (Flu A&B, Covid) Nasopharyngeal Swab     Status: None   Collection Time: 08/28/21  5:52 PM   Specimen: Nasopharyngeal Swab; Nasopharyngeal(NP) swabs in vial transport medium  Result Value Ref Range Status   SARS Coronavirus 2 by RT PCR NEGATIVE NEGATIVE Final    Comment: (NOTE) SARS-CoV-2 target nucleic acids are NOT DETECTED.  The SARS-CoV-2 RNA is generally detectable in upper respiratory specimens during the acute phase of infection. The lowest concentration of SARS-CoV-2 viral copies this assay can detect is 138 copies/mL. A negative result does not preclude SARS-Cov-2 infection and should not be used as the sole basis for treatment or other patient management decisions. A negative result may occur with  improper specimen collection/handling, submission of specimen other than nasopharyngeal swab, presence of viral mutation(s) within the  areas targeted by this assay, and inadequate number of viral copies(<138 copies/mL). A negative result must be combined with clinical observations, patient history, and epidemiological information. The expected result is Negative.  Fact  Sheet for Patients:  EntrepreneurPulse.com.au  Fact Sheet for Healthcare Providers:  IncredibleEmployment.be  This test is no t yet approved or cleared by the [place] FDA and  has been authorized for detection and/or diagnosis of SARS-CoV-2 by FDA under an Emergency Use Authorization (EUA). This EUA will remain  in effect (meaning this test can be used) for the duration of the COVID-19 declaration under Section 564(b)(1) of the Act, 21 U.S.C.section 360bbb-3(b)(1), unless the authorization is terminated  or revoked sooner.       Influenza A by PCR NEGATIVE NEGATIVE Final   Influenza B by PCR NEGATIVE NEGATIVE Final    Comment: (NOTE) The Xpert Xpress SARS-CoV-2/FLU/RSV plus assay is intended as an aid in the diagnosis of influenza from Nasopharyngeal swab specimens and should not be used as a sole basis for treatment. Nasal washings and aspirates are unacceptable for Xpert Xpress SARS-CoV-2/FLU/RSV testing.  Fact Sheet for Patients: EntrepreneurPulse.com.au  Fact Sheet for Healthcare Providers: IncredibleEmployment.be  This test is not yet approved or cleared by the [place] FDA and has been authorized for detection and/or diagnosis of SARS-CoV-2 by FDA under an Emergency Use Authorization (EUA). This EUA will remain in effect (meaning this test can be used) for the duration of the COVID-19 declaration under Section 564(b)(1) of the Act, 21 U.S.C. section 360bbb-3(b)(1), unless the authorization is terminated or revoked.  Performed at [facility], [address] [place], [place] [address]   Culture, blood (Routine x 2)     Status: None   Collection Time: 08/28/21  9:03 PM   Specimen: BLOOD  Result Value Ref Range Status   Specimen Description BLOOD [facility]  Final   Special Requests BOTTLES DRAWN AEROBIC AND ANAEROBIC BCLV  Final   Culture   Final    NO GROWTH 5 DAYS Performed at  [facility], [address], [place], [place] [address]    Report Status 09/02/2021 FINAL  Final  Urine Culture     Status: Abnormal   Collection Time: 08/28/21  9:15 PM   Specimen: Urine, Random  Result Value Ref Range Status   Specimen Description   Final    URINE, RANDOM Performed at [facility], [address] [place], [place] [address]    Special Requests   Final    NONE Performed at [facility], [address] [place], [place] [address]    Culture (A)  Final    <10,000 COLONIES/mL INSIGNIFICANT GROWTH Performed at [facility], [place] [address] [place], [place] [address]    Report Status 08/30/2021 FINAL  Final  Body fluid culture w Gram Stain     Status: None (Preliminary result)   Collection Time: 08/31/21 11:40 AM   Specimen: Synovium; Body Fluid  Result Value Ref Range Status   Specimen Description   Final    SYNOVIAL Performed at [facility], [address] [place], [place] [address]    Special Requests   Final    RIGHT KNEE Performed at [facility], [address] [place], [place] [address]    Gram Stain   Final    ABUNDANT WBC PRESENT,BOTH PMN AND MONONUCLEAR NO ORGANISMS SEEN    Culture   Final    NO GROWTH 3 DAYS Performed at [facility], [place] [address] [place], [place] [address]    Report Status PENDING  Incomplete  Aerobic/Anaerobic Culture w Gram Stain (surgical/deep wound)     Status: None (Preliminary result)   Collection Time: 09/01/21  1:42 PM   Specimen: Synovium; Body Fluid  Result Value Ref Range Status   Specimen Description TISSUE SYNOVIAL RIGHT KNEE  Final   Special Requests SAMPLE 1  Final   Gram Stain   Final    FEW WBC PRESENT, PREDOMINANTLY MONONUCLEAR NO ORGANISMS SEEN    Culture   Final    NO GROWTH < 24 HOURS Performed at [facility], [address], [place], [place] [address]    Report Status PENDING  Incomplete  Aerobic/Anaerobic Culture w Gram  Stain (surgical/deep wound)     Status: None (Preliminary result)   Collection Time: 09/01/21  1:43 PM   Specimen: Synovium; Body Fluid  Result Value Ref Range Status   Specimen Description TISSUE SYNOVIAL RIGHT KNEE  Final   Special Requests SAMPLE 2  Final   Gram Stain   Final    RARE WBC PRESENT, PREDOMINANTLY MONONUCLEAR NO ORGANISMS SEEN    Culture   Final    NO GROWTH < 24 HOURS Performed at [facility], [address] [place],  [address]    Report Status PENDING  Incomplete  Aerobic/Anaerobic Culture w Gram Stain (surgical/deep wound)     Status: None (Preliminary result)   Collection Time: 09/01/21  1:44 PM   Specimen: Synovium; Body Fluid  Result Value Ref Range Status   Specimen Description TISSUE SYNOVIAL RIGHT KNEE  Final   Special Requests SAMPLE NO 3  Final   Gram Stain NO ORGANISMS SEEN  Final   Culture   Final    NO GROWTH < 24 HOURS Performed at [facility], [address], [place],  [address]    Report Status PENDING  Incomplete    RADIOLOGY:  No results found.   CODE STATUS:     Code Status Orders  (From admission, onward)           Start     Ordered   08/28/21 2217  Full code  Continuous        08/28/21 2221           Code Status History     Date Active Date Inactive Code Status Order ID Comments User Context   08/10/2021 0200 08/23/2021 2243 Full Code [id number]  Lang Snow, NP ED   07/08/2021 1412 07/26/2021 0147 Full Code [id number]  Lang Snow, NP ED   08/09/2020 2339 09/24/2020 2320 Full Code [id number]  Vianne Bulls, MD ED   07/08/2020 1158 07/08/2020 1830 Full Code [id number]  Herbert Pun, MD ED   06/28/2020 1704 07/03/2020 1942 Full Code [id number]  Herbert Pun, MD Inpatient   11/22/2019 1425 11/22/2019 2202 Full Code [id number]  Herbert Pun, MD ED        TOTAL TIME TAKING CARE OF THIS PATIENT: 35 minutes.    Fritzi Mandes M.D  [facility]     CC: Primary care physician; [name], MD

## 2021-09-04 ENCOUNTER — Encounter: Payer: Self-pay | Admitting: Orthopedic Surgery

## 2021-09-04 LAB — COPPER, SERUM: Copper: 103 ug/dL (ref 69–132)

## 2021-09-04 LAB — ZINC: Zinc: 49 ug/dL (ref 44–115)

## 2021-09-04 LAB — VITAMIN B1: Vitamin B1 (Thiamine): 152 nmol/L (ref 66.5–200.0)

## 2021-09-05 LAB — SURGICAL PATHOLOGY

## 2021-09-05 LAB — MISC LABCORP TEST (SEND OUT): Labcorp test code: 81893

## 2021-09-05 NOTE — Anesthesia Postprocedure Evaluation (Signed)
Anesthesia Post Note  Patient: Craig [name]  Procedure(s) Performed: ARTHROSCOPIC DEBRIDEMENT OF KNEE WITH SYNOVIUM BIOPSY (Right: Knee)  Patient location during evaluation: PACU Anesthesia Type: General Level of consciousness: awake and alert Pain management: pain level controlled Vital Signs Assessment: post-procedure vital signs reviewed and stable Respiratory status: spontaneous breathing, nonlabored ventilation, respiratory function stable and patient connected to nasal cannula oxygen Cardiovascular status: blood pressure returned to baseline and stable Postop Assessment: no apparent nausea or vomiting Anesthetic complications: no   No notable events documented.   Last Vitals:  Vitals:   09/03/21 0815 09/03/21 1106  BP:  120/80  Pulse: (!) 105 100  Resp: 19 20  Temp:  36.8 C  SpO2: 98% 99%    Last Pain:  Vitals:   09/03/21 1350  TempSrc:   PainSc: [name]

## 2021-09-06 LAB — AEROBIC/ANAEROBIC CULTURE W GRAM STAIN (SURGICAL/DEEP WOUND)
Culture: NO GROWTH
Culture: NO GROWTH
Culture: NO GROWTH
Gram Stain: NONE SEEN

## 2021-09-06 LAB — ACID FAST SMEAR (AFB, MYCOBACTERIA)
Acid Fast Smear: NEGATIVE
Acid Fast Smear: NEGATIVE

## 2021-09-06 LAB — VITAMIN C: Vitamin C: 0.6 mg/dL (ref 0.4–2.0)

## 2021-09-08 ENCOUNTER — Telehealth: Payer: Self-pay | Admitting: Nurse Practitioner

## 2021-09-08 LAB — VITAMIN E
Vitamin E (Alpha Tocopherol): 7.6 mg/L (ref 5.9–19.4)
Vitamin E(Gamma Tocopherol): 2.6 mg/L (ref 0.7–4.9)

## 2021-09-08 LAB — VITAMIN A: Vitamin A (Retinoic Acid): 17 ug/dL — ABNORMAL LOW (ref 18.9–57.3)

## 2021-09-08 NOTE — Telephone Encounter (Signed)
Spoke with patient's wife [name], regarding the Palliative referral/services and all questions were answered and she was in agreement with scheduling.  I have scheduled a Telehealth Consult for 09/19/21 @ 1 PM.

## 2021-09-12 ENCOUNTER — Encounter: Payer: Self-pay | Admitting: Oncology

## 2021-09-12 LAB — ACID FAST SMEAR (AFB, MYCOBACTERIA): Acid Fast Smear: NEGATIVE

## 2021-09-12 NOTE — Telephone Encounter (Addendum)
I will send message to pt.

## 2021-09-13 ENCOUNTER — Encounter: Payer: Self-pay | Admitting: *Deleted

## 2021-09-13 ENCOUNTER — Encounter: Payer: Self-pay | Admitting: Dietician

## 2021-09-13 NOTE — Progress Notes (Signed)
Nutrition Brief Note  Vitamin labs sent out on 1/13 returned   Copper- 103 B1- 152.0 Vitamin D, 25-Hydroxy- 42.72 Vitamin A- 17.0(L) B12- 503 Vitamin C 0.6 Vitamin E 7.6 Zinc- 49 Iron- 31(L) TIBC 252 Ferritin 67 Folate- 34   Spoke with pt via phone. Notified pt of lab results and made recommendations for supplementation. Pt and family voiced understanding.    Recommended:  Vitamin D (cholecalciferol) 1000 units po daily Vitamin C 500mg  po BID x 30 days Vitamin A 10,000IU daily x 30 days Zinc 50mg  (elemental) po daily x 2 weeks  Follow up with PCP in 2-3 months to recheck labs  Koleen Distance MS, RD, LDN Please refer to [facility] for RD and/or RD on-call/weekend/after hours pager

## 2021-09-13 NOTE — Telephone Encounter (Signed)
Dr. Janese Banks I sent message tonight

## 2021-09-19 ENCOUNTER — Other Ambulatory Visit: Payer: Self-pay

## 2021-09-19 ENCOUNTER — Telehealth: Payer: Medicaid Other | Admitting: Nurse Practitioner

## 2021-09-19 DIAGNOSIS — Z515 Encounter for palliative care: Secondary | ICD-10-CM

## 2021-09-19 DIAGNOSIS — J9621 Acute and chronic respiratory failure with hypoxia: Secondary | ICD-10-CM

## 2021-09-19 DIAGNOSIS — J9622 Acute and chronic respiratory failure with hypercapnia: Secondary | ICD-10-CM

## 2021-09-19 DIAGNOSIS — R0602 Shortness of breath: Secondary | ICD-10-CM

## 2021-09-19 DIAGNOSIS — R5381 Other malaise: Secondary | ICD-10-CM

## 2021-09-19 NOTE — Progress Notes (Signed)
[facility] Palliative Care Consult Note Telephone: [phone]  Fax: [phone]   Date of encounter: 09/19/21 4:28 PM PATIENT NAME: Craig [name] [place] [address]   [phone] (home)  DOB: [birth year] MRN: [id number] PRIMARY CARE PROVIDER:    [name], MD,  [place] [address] [phone]  REFERRING PROVIDER:   [name], MD [facility] [place],  [place] [address] [phone]  RESPONSIBLE PARTY:    Contact Information     Name Relation Home Work Mobile   Mercer Spouse [phone]  [phone]   Coady, Train Mother [phone]        Due to the COVID-19 crisis, this visit was done via telemedicine from my office and it was initiated and consent by this patient and or family.  I connected with  Craig [name] OR PROXY on 09/19/21 by a video enabled telemedicine application and verified that I am speaking with the correct person.   I discussed the limitations of evaluation and management by telemedicine. The patient expressed understanding and agreed to proceed. . Palliative Care was asked to follow this patient by consultation request of  [name], MD to address advance care planning and complex medical decision making. This is the initial visit.                  ASSESSMENT AND PLAN / RECOMMENDATIONS:  Advance Care Planning/Goals of Care: Goals include to maximize quality of life and symptom management. Patient/health care surrogate gave his/her permission to discuss.Our advance care planning conversation included a discussion about:    The value and importance of advance care planning  Experiences with loved ones who have been seriously ill or have died  Exploration of personal, cultural or spiritual beliefs that might influence medical decisions  Exploration of goals of care  in the event of a sudden injury or illness  Identification  of a healthcare agent  Review and updating or creation of an  advance directive document . Decision not to resuscitate or to de-escalate disease focused treatments due to poor prognosis. CODE STATUS: full code  Symptom Management/Plan: 1. Advance Care Planning; full code, full scope of treatment; he has 2 children ages 33 and 63 living with he and his wife at home.   2. Shortness of breath secondary to chronic respiratory failure. We talked about monitoring, respiratory, O2 continuous. We talked about symptoms, management  3. Debility secondary to severe decompensation, discussed currently receiving PT/OT, encourage mobility though very limited. Encouraged motivation.   4. Goals of Care: Goals include to maximize quality of life and symptom management. Our advance care planning conversation included a discussion about:    The value and importance of advance care planning  Exploration of personal, cultural or spiritual beliefs that might influence medical decisions  Exploration of goals of care in the event of a sudden injury or illness  Identification and preparation of a healthcare agent  Review and updating or creation of an advance directive document.  5. Palliative care encounter; Palliative care encounter; Palliative medicine team will continue to support patient, patient's family, and medical team. Visit consisted of counseling and education dealing with the complex and emotionally intense issues of symptom management and palliative care in the setting of serious and potentially life-threatening illness  6. f/u 1 month for ongoing monitoring chronic disease progression, ongoing discussions complex medical decision making  Follow up Palliative Care Visit: Palliative care will continue to follow for complex medical decision making, advance care planning, and clarification of goals. Return 4 weeks or prn.  I spent 68 minutes  providing this consultation. More than 50% of the time in this consultation was spent in counseling and care coordination. PPS: 30%  HOSPICE ELIGIBILITY/DIAGNOSIS: TBD  Chief Complaint: Initial palliative consult for complex medical decision making  HISTORY OF PRESENT ILLNESS:  Craig [name] is a 31 y.o. 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 Recent hospitalization 08/28/2021 to 09/03/2021 for Acute on chronic hypoxic respiratory failure with metabolic encephalopathy improved, Chronic respiratory failure on chronic home oxygen and CPAP, Right knee pain chronic with history of right knee septic arthritis status post right anterior knee fluid aspiration with arthroscopic knee wash out. Craig [name] was d/c home with his wife and 2 children. I connected by video for telemedicine with Craig [name] and wife. We talked about purpose of PC, Craig [name] in agreement. 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 Hospitalization for covid about 2 years ago has left him now non-ambulatory. Craig [name] stays in the recliner 24hrs a day. Craig [name] endorses he sleeps well there. We talked about past medical history, ros, symptoms, hospitalization with covid and most recent. We talked about Craig [name] currently receiving OT/PT which just restarted. He is followed by [facility]. Craig [name] endorses he has developed a sacral wound his wife has been taking care of but would benefit from home  health nursing. We talked about symptoms of shortness of breath, continuous O2. We talked about appetite which is good. No weight loss. We talked about medical goals including code status. Craig [name] endorses wishes are for full code with full scope of treatment. Craig [name] endorses he is still young, 31 yrs of age and has young children he wants to live for. We talked about quality of life. We talked about role pc in poc. Therapeutic listening, emotional support provided. Questions answered.  History obtained from review of EMR, discussion with  Craig [name].  I reviewed available labs, medications, imaging, studies and related documents from the EMR.  Records reviewed and summarized above.   ROS 10 point system reviewed with Craig [name]  Physical Exam: deferred CURRENT PROBLEM LIST:  Patient Active Problem List   Diagnosis Date Noted   Electrolyte abnormality 08/29/2021   DM II (diabetes mellitus, type II), controlled ([place]) 08/29/2021   Chronic pain of right knee    Hypokalemia    AMS (altered mental status) 08/28/2021   Acute on chronic respiratory failure with hypoxia and hypercapnia ([place]) 08/10/2021   Acute on chronic respiratory failure with hypoxemia ([place]) 07/08/2021   Neuropathic pain, arm 10/20/2020   Pressure injury of skin 09/18/2020   Staphylococcus aureus bacteremia    Fever    Metabolic encephalopathy    Acute respiratory failure with hypoxia ([place])    Acute hypoxemic respiratory failure due to COVID-19 ([place]) 08/09/2020   Severe sepsis ([place]) 08/09/2020   AKI (acute kidney injury) ([place]) 08/09/2020   Hypertension    Leak of anastomosis  between gastrointestinal structures 07/08/2020   Pneumoperitoneum 07/08/2020   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   Chronic gouty arthritis 02/17/2020   Class 2 severe obesity with serious comorbidity in adult [facility]) 02/17/2020   Diverticulitis of colon with perforation 11/22/2019   PAST MEDICAL HISTORY:  Active Ambulatory Problems     Diagnosis Date Noted   Diverticulitis of colon with perforation 11/22/2019   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   Leak of anastomosis between gastrointestinal structures 07/08/2020   Pneumoperitoneum 07/08/2020   Acute hypoxemic respiratory failure due to COVID-19 [facility]) 08/09/2020   Severe sepsis ([place]) 08/09/2020   AKI (acute kidney injury) ([place]) 08/09/2020   Hypertension    Acute respiratory failure with hypoxia ([facility])    Fever    Metabolic encephalopathy    Staphylococcus aureus bacteremia    Pressure injury of skin 09/18/2020   Chronic gouty arthritis 02/17/2020   Class 2 severe obesity with serious comorbidity in adult [facility]) 02/17/2020   Neuropathic pain, arm 10/20/2020   Acute on chronic respiratory failure with hypoxemia ([place]) 07/08/2021   Acute on chronic respiratory failure with hypoxia and hypercapnia ([place]) 08/10/2021   AMS (altered mental status) 08/28/2021   Electrolyte abnormality 08/29/2021   DM II (diabetes mellitus, type II), controlled ([place]) 08/29/2021   Chronic pain of right knee    Hypokalemia    Resolved Ambulatory Problems    Diagnosis Date Noted   No Resolved Ambulatory Problems   Past Medical History:  Diagnosis Date   Gout    Rupture of bowel ([place])    SOCIAL HX:  Social History   Tobacco Use   Smoking status: Never   Smokeless tobacco: Current    Types: Chew   Tobacco comments:    Not ready to quit.   Substance Use Topics   Alcohol use: Yes    Comment: pint to a fifth per day per patient . Patient reports he hasn't had anything to drink in three weeks.   FAMILY HX:  Family History  Problem Relation Age of Onset   Healthy Mother    Healthy Father       ALLERGIES:  Allergies  Allergen Reactions   Tramadol Hcl     SEIZURES!!! DO NOT REORDER AT DISCHARGE!!!   Fentanyl Citrate Other (See Comments)    Makes patient more agitated & delirious   Amoxicillin Rash    Tolerated cefepime and cefazolin 08/2020. Tolerated Zosyn 07/2021      PERTINENT MEDICATIONS:  Outpatient Encounter Medications as of 09/19/2021  Medication Sig   acetaminophen (TYLENOL) 650 MG CR tablet Take 650 mg by mouth every 8 (eight) hours as needed.   acidophilus (RISAQUAD) CAPS capsule Take 2 capsules by mouth daily for 25 days.   allopurinol (ZYLOPRIM) 300 MG tablet Take 300 mg by mouth daily.   ascorbic acid (VITAMIN C) 500 MG tablet Take 1 tablet (500 mg total) by mouth 2 (two) times daily. (Patient taking differently: Take 1,000 mg by mouth at bedtime.)   blood glucose meter kit and supplies KIT Dispense based on patient and insurance preference. Use up to four times daily as directed.   budesonide (PULMICORT) 0.25 MG/2ML nebulizer solution Take 0.25 mg by nebulization 2 (two) times daily.   cephALEXin (KEFLEX) 500 MG capsule Take 2 capsules (1,000 mg total) by mouth 4 (four) times daily for 26 days.   cetirizine (ZYRTEC) 10 MG tablet Take 10 mg by mouth daily.   Cholecalciferol (VITAMIN D3) 25 MCG (1000 UT) CAPS Take 1 capsule by mouth  daily.   clotrimazole (LOTRIMIN) 1 % cream Apply topically 2 (two) times daily.   [employer] SALINE NASAL SPRAY 0.65 % nasal spray SMARTSIG:1 Spray(s) Both Nares Every 4 Hours PRN   docusate sodium (COLACE) 100 MG capsule Take 1 capsule (100 mg total) by mouth 2 (two) times daily as needed for mild constipation.   doxazosin (CARDURA) 1 MG tablet Take 1 mg by mouth daily.   DULoxetine (CYMBALTA) 60 MG capsule Take 60 mg by mouth daily.   famotidine (PEPCID) 20 MG tablet Take 20 mg by mouth 2 (two) times daily as needed.   folic acid (FOLVITE) 1 MG tablet Take 1 mg by mouth daily.   furosemide (LASIX) 40 MG tablet Take 1 tablet (40 mg total) by mouth 2 (two) times daily. (Patient taking differently: Take 40 mg by mouth daily.)   HYDROcodone-acetaminophen (NORCO) 7.5-325 MG tablet Take 1 tablet by mouth every 6 (six) hours as needed for moderate pain.   lidocaine (LIDODERM) 5 % 1 patch daily as needed.   magnesium oxide  (MAG-OX) 400 (240 Mg) MG tablet Take 1 tablet (400 mg total) by mouth 2 (two) times daily.   melatonin 3 MG TABS tablet Take 6 mg by mouth at bedtime as needed.   metoprolol succinate (TOPROL-XL) 50 MG 24 hr tablet Take 3 tablets (150 mg total) by mouth daily. Take with or immediately following a meal.   Multiple Vitamins-Minerals (MULTIVITAMIN WITH MINERALS) tablet Take 1 tablet by mouth daily.   nicotine (NICODERM CQ - DOSED IN MG/24 HOURS) 21 mg/24hr patch Place 1 patch (21 mg total) onto the skin daily. (Patient taking differently: Place 21 mg onto the skin daily as needed. As needed for hospital admission.)   nystatin (MYCOSTATIN/NYSTOP) powder Apply topically 3 (three) times daily. (Patient taking differently: Apply topically 3 (three) times daily as needed.)   ondansetron (ZOFRAN) 4 MG tablet Take 4 mg by mouth every 8 (eight) hours as needed.   polyethylene glycol (MIRALAX / GLYCOLAX) 17 g packet Take 17 g by mouth daily.   potassium chloride SA (KLOR-CON M) 20 MEQ tablet Take 1 tablet (20 mEq total) by mouth 2 (two) times daily.   pregabalin (LYRICA) 200 MG capsule Take 200 mg by mouth 2 (two) times daily.   rivaroxaban (XARELTO) 20 MG TABS tablet Take 20 mg by mouth daily with supper.   No facility-administered encounter medications on file as of 09/19/2021.   Thank you for the opportunity to participate in the care of Craig. Fleet.  The palliative care team will continue to follow. Please call our office at [phone] if we can be of additional assistance.   Questions and concerns were addressed. The patient/family was encouraged to call with questions and/or concerns. My contact information was provided. Provided general support and encouragement, no other unmet needs identified   Stevie Charter Ihor Gully, NP ,

## 2021-09-20 ENCOUNTER — Encounter: Payer: Self-pay | Admitting: Nurse Practitioner

## 2021-09-21 ENCOUNTER — Encounter: Payer: Self-pay | Admitting: Infectious Diseases

## 2021-09-22 ENCOUNTER — Telehealth: Payer: Self-pay

## 2021-09-22 NOTE — Telephone Encounter (Signed)
402 pm.  Request received from Christin Gusler, NP to notify [facility] of sacral wound.  Phone call made to [facility] to inform them of PC teleheath visit and sacral wound.  Spoke with Jake Bathe who will notify team and schedule a home visit.  They are aware of low grade fever and advised wife contacted them last night.  Wife was advised to contact infectious disease.

## 2021-09-25 ENCOUNTER — Telehealth: Payer: Self-pay

## 2021-09-25 ENCOUNTER — Other Ambulatory Visit: Payer: Self-pay

## 2021-09-25 NOTE — Telephone Encounter (Signed)
Contacted patient spouse after recvd another mychart reporting night temps of 99 and aching all over. Spouse states she has him set up with [facility] and a PCP will be by to see him today. He will finish his oral abx 10/01/21 and has video visit scheduled 09/28/21 at [facility] for hosp follow up. Will contact us if [facility] PCP needs anything.

## 2021-09-28 ENCOUNTER — Ambulatory Visit: Payer: Medicaid Other | Attending: Infectious Diseases | Admitting: Infectious Diseases

## 2021-09-28 ENCOUNTER — Encounter: Payer: Self-pay | Admitting: Infectious Diseases

## 2021-09-28 ENCOUNTER — Other Ambulatory Visit: Payer: Self-pay

## 2021-09-28 DIAGNOSIS — R7881 Bacteremia: Secondary | ICD-10-CM | POA: Insufficient documentation

## 2021-09-28 DIAGNOSIS — J984 Other disorders of lung: Secondary | ICD-10-CM | POA: Diagnosis not present

## 2021-09-28 DIAGNOSIS — G4733 Obstructive sleep apnea (adult) (pediatric): Secondary | ICD-10-CM | POA: Insufficient documentation

## 2021-09-28 DIAGNOSIS — M00061 Staphylococcal arthritis, right knee: Secondary | ICD-10-CM | POA: Diagnosis not present

## 2021-09-28 DIAGNOSIS — U099 Post covid-19 condition, unspecified: Secondary | ICD-10-CM | POA: Diagnosis not present

## 2021-09-28 DIAGNOSIS — B9561 Methicillin susceptible Staphylococcus aureus infection as the cause of diseases classified elsewhere: Secondary | ICD-10-CM | POA: Diagnosis not present

## 2021-09-28 NOTE — Progress Notes (Signed)
The purpose of this virtual visit is to provide medical care while limiting exposure to the novel coronavirus (COVID19) for both patient and office staff.   Consent was obtained for phone visit:  Yes.   Answered questions that patient had about telehealth interaction:  Yes.   I discussed the limitations, risks, security and privacy concerns of performing an evaluation and management service by telephone. I also discussed with the patient that there may be a patient responsible charge related to this service. The patient expressed understanding and agreed to proceed.   Patient Location: Home Provider Location: office People on the call - patient, wife, provider,   Follow-up after recent hospitalization Patient was in the hospital between 07/08/2021 until 07/25/2021 for septic arthritis of his right knee with MSSA.  He also had MSSA bacteremia He underwent arthroscopic washout of the right knee. He was initially treated with IV nafcillin and later switched to IV cefazolin.  He completed  08/10/2021.  He was then placed on p.o. cefadroxil. Patient presented again to the hospital on 08/28/2021 and stayed till 09/01/2021 with joint pain body aches.  The cefadroxil was changed to p.o. Keflex 1 g every 6 on 08/30/2021 to complete 4 weeks of antibiotics During the recent hospitalization he had aspiration of the right knee and the fluid was turbid with no crystals WBC 36,000.  Culture was negative.  On 09/01/2021 Dr. Rudene Christians did an arthroscopic debridement of the knee with synovial biopsy.  Bacterial, fungal and acid-fast bacilli cultures have all been negative so far.  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  - NO CRYSTALS IDENTIFIED ON POLARIZED LIGHT EVALUATION.  She has been having some low-grade temperature of 99 He is got right knee and left knee pain and shoulder pain He was recently evaluated by [facility]  physicians at home and they sent blood tests and his WBC was 9.6 on 09/25/2021, Hb 10.7, platelet 328, glucose 122, creatinine 0.44, ESR 74 and C-reactive protein 53 the cutoff less than 10.  09/01/2021 the CRP was 8.45 the cutoff was less than 1 and ESR was 77.  So pretty much they remain the same.  Patient has complicated medical history.  He was initially admitted on 11/22/2019 with diverticulitis/colon perforation and underwent emergent partial colectomy and end colostomy.  During that hospitalization he needed Solu-Medrol for an acute gout attack.  He was discharged on 12/01/2019.  He was readmitted in November 2021 for an elective colostomy takedown.  Initially it was a robotic lap but later converted to laparotomy.  Small bowel resection and parastomal hernia repair was done on 06/28/2020.  He was discharged on 07/03/2020.  He was readmitted on 07/08/2020 with sepsis and CT showing pneumoperitoneum.  He underwent laparotomy and partial colectomy with colostomy on 07/08/2020 and was discharged on 07/14/2020.  Then a month later he presented to the hospital after testing positive for COVID with fever and hypotension.  He was intubated and then had to have a tracheostomy and had a Staph aureus bacteremia.  He was placed on cefazolin.  He also had PE and MI during that hospitalization.  He was transferred to [place] on 09/24/2020 with tracheostomy and PEG.  He then was discharged home.  A few weeks later.  He then developed pain in his arms and legs and was thought to be due to neuropathy.  He saw a neurologist at [facility] clinic and underwent EMG and NCV on 02/01/2021.  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  He saw Dr. Jefm Bryant rheumatologist on 02/06/21 he is bedbound.  Initially his right knee was injected with steroids for gout.  But because of persisting pain and swelling he was hospitalized in [facility] on 07/08/2021 until 07/25/2021 when he had MSSA  bacteremia MSSA septic arthritis and underwent washout.  He also had acute on chronic hypoxic and hypercarbic respiratory failure.  He had a TEE on 07/12/2021 which was negative.  He was then discharged home on 07/25/2021 with IV cefazolin 3 g every 8 hours to complete 4 weeks.  After that he has been hospitalized for shortness of breath couple of times.  Patient currently has pain and has been started on meloxicam by Dr. Jefm Bryant. He is starting to get physical therapy at home.  He sleeps in a recliner. He has no diarrhea He has no rash He has a CPAP machine.  Objective No physical examination was done because of telephone visit  Impression/recommendation MSSA bacteremia with MSSA right knee septic arthritis.  Has been on antibiotics since November 2022 and will complete today.  This is close to 13 weeks of antibiotics. 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  COVID-related lung disease Obstructive sleep apnea CPAP and home oxygen   Explained to the patient and the wife that currently there is no evidence of active infection.  He has been adequately treated.  We will stop all his antibiotics and observe him off of that.  Can repeat labs if needed in few weeks time He will follow-up with his PCP and rheumatologist.  Total time spent on this call 11 minutes

## 2021-10-02 LAB — FUNGUS CULTURE WITH STAIN

## 2021-10-02 LAB — FUNGUS CULTURE RESULT

## 2021-10-02 LAB — FUNGAL ORGANISM REFLEX

## 2021-10-03 LAB — FUNGUS CULTURE WITH STAIN

## 2021-10-03 LAB — FUNGUS CULTURE RESULT

## 2021-10-03 LAB — FUNGAL ORGANISM REFLEX

## 2021-10-12 ENCOUNTER — Encounter: Payer: Self-pay | Admitting: Infectious Diseases

## 2021-10-18 LAB — ACID FAST CULTURE WITH REFLEXED SENSITIVITIES (MYCOBACTERIA)
Acid Fast Culture: NEGATIVE
Acid Fast Culture: NEGATIVE

## 2021-10-27 LAB — ACID FAST CULTURE WITH REFLEXED SENSITIVITIES (MYCOBACTERIA): Acid Fast Culture: NEGATIVE

## 2021-11-06 ENCOUNTER — Telehealth: Payer: Self-pay | Admitting: Infectious Diseases

## 2021-11-06 NOTE — Telephone Encounter (Signed)
patient has restarted oral antibiotics as he seems to have a flare of his left knee cellulitis versus septic joint.  I saw him via telemedicine as he cannot come in to be seen.  I think he will need to follow-up with Dr. [name] in the next 2 to 4 weeks if possible.  He sees Dr. Jefm Bryant in rheumatology next week ?

## 2021-11-10 NOTE — Telephone Encounter (Signed)
Patient is scheduled for 11/30/21. Patient wife will call if unable to keep the appointment in person and will change to televisit ?

## 2021-11-30 ENCOUNTER — Ambulatory Visit: Payer: Medicaid Other | Attending: Infectious Diseases | Admitting: Infectious Diseases

## 2021-11-30 ENCOUNTER — Encounter: Payer: Self-pay | Admitting: Infectious Diseases

## 2021-11-30 DIAGNOSIS — Z79899 Other long term (current) drug therapy: Secondary | ICD-10-CM | POA: Insufficient documentation

## 2021-11-30 DIAGNOSIS — B9561 Methicillin susceptible Staphylococcus aureus infection as the cause of diseases classified elsewhere: Secondary | ICD-10-CM | POA: Insufficient documentation

## 2021-11-30 DIAGNOSIS — Z8616 Personal history of COVID-19: Secondary | ICD-10-CM | POA: Diagnosis not present

## 2021-11-30 DIAGNOSIS — Z792 Long term (current) use of antibiotics: Secondary | ICD-10-CM | POA: Insufficient documentation

## 2021-11-30 DIAGNOSIS — M00061 Staphylococcal arthritis, right knee: Secondary | ICD-10-CM

## 2021-11-30 DIAGNOSIS — Z9981 Dependence on supplemental oxygen: Secondary | ICD-10-CM | POA: Diagnosis not present

## 2021-11-30 MED ORDER — CEPHALEXIN 500 MG PO CAPS: 1000.0000 mg | ORAL_CAPSULE | Freq: Four times a day (QID) | ORAL | 1 refills | Status: DC

## 2021-11-30 NOTE — Progress Notes (Signed)
The purpose of this virtual visit is to provide medical care while limiting exposure to the novel coronavirus (COVID19) for both patient and office staff. ?  ?Consent was obtained for video visit:  Yes.   ?Answered questions that patient had about telehealth interaction:  Yes.   ?I discussed the limitations, risks, security and privacy concerns of performing an evaluation and management service by telephone. I also discussed with the patient that there may be a patient responsible charge related to this service. The patient expressed understanding and agreed to proceed. ?  ?Patient Location: Home ?Provider Location: Office ?His mother was in the visit as well ? ?Patient with complicated medical history ?He has septic arthritis of his right knee with MSSA which was diagnosed November 2022.  He was treated with 4 weeks of IV antibiotics followed by 4 weeks of oral Keflex high-dose of 1 g every 6 and completed February 2023. ?He continues to have pain in his right knee and also redness in the right foot.  So he had a telemetry visit with Dr. Ola Spurr on 11/06/2021 who restarted keflex 1 gram PO q 6.  He has been doing fine since then. ?Redness rt foot almost resolved ? ?Past medical history ?Diverticulitis/colon perforation November 22, 2019 and underwent emergent partial colectomy and end colostomy ?During that hospitalization he needed Solu-Medrol for an acute gout attack. ?November 2020 when admitted for elective colostomy takedown.  Initially it was a robotic lap but later converted to laparotomy.  Needed small bowel resection and parastomal hernia repair on 06/28/2020 and was discharged on 07/03/2020. ?Readmitted on 07/08/2020 for sepsis and pneumoperitoneum and underwent partial colectomy and colostomy on 07/08/2020.  He was discharged on 07/14/2020. ? ?December 2021 tested positive for COVID and was hospitalized and was intubated and had to have a tracheostomy.  Had Staph aureus bacteremia at that time and had  cefazolin for 4 weeks.  He also had PE and MI during that hospitalization.  He was then transferred to [facility] on 09/24/2020 with tracheostomy and PEG.  He was later then discharged home. ?Since then patient has been bedbound.  He has had ICU neuropathy and has had pain in his joints.  He has been followed by rheumatology Ortho ? ?07/08/2021 until 07/25/2021 hospitalized for MSSA bacteremia with MSSA septic arthritis of the right knee.  Underwent debridement and washout.  Since then has been on antibiotics intermittently. ? ?Patient states he is feeling better ?No fever ?His breathing is better he is oxygen dependent ?He is eating better ?He gets physical therapy once or week and he is able to stand up ?He still has pain in his knee but better than before ?He is going to see Dr. Rudene Christians next week on Monday ?On examination patient was in no distress.  He had nasal l oxygen ?Right knee erythema and swelling much better ?Right foot no erythema ? ? ? 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 every 6 for the next 2 months ?He has an appointment with Dr. Rudene Christians on Monday.  I I will try to see him with Dr. Rudene Christians. ?We will also get ESR, CRP,  CMP and CBC during that visit. ? ?Total time spent on this visit 20 minutes ?

## 2022-02-01 ENCOUNTER — Other Ambulatory Visit: Payer: Self-pay | Admitting: Orthopedic Surgery

## 2022-02-01 DIAGNOSIS — M00061 Staphylococcal arthritis, right knee: Secondary | ICD-10-CM

## 2022-02-01 DIAGNOSIS — M175 Other unilateral secondary osteoarthritis of knee: Secondary | ICD-10-CM

## 2022-02-13 ENCOUNTER — Ambulatory Visit: Payer: Medicaid Other

## 2022-02-15 ENCOUNTER — Encounter: Payer: Self-pay | Admitting: Infectious Diseases

## 2022-02-22 NOTE — Telephone Encounter (Signed)
I spoke to patient relayed lab results to the patient. Patient states that the swelling in the knee is better, but still painful. Patient had to cancel his MRI appointment for tomorrow due to his wife testing positive for COVID. MRI has been rescheduled to 03/09/22. Anadia Helmes T Brooks Sailors

## 2022-02-23 ENCOUNTER — Ambulatory Visit: Payer: Medicaid Other

## 2022-03-09 ENCOUNTER — Ambulatory Visit
Admission: RE | Admit: 2022-03-09 | Discharge: 2022-03-09 | Disposition: A | Discharge status: Home / Self Care | Payer: Medicaid Other | Source: Ambulatory Visit | Attending: Orthopedic Surgery | Admitting: Orthopedic Surgery

## 2022-03-09 DIAGNOSIS — M175 Other unilateral secondary osteoarthritis of knee: Secondary | ICD-10-CM | POA: Diagnosis present

## 2022-03-09 DIAGNOSIS — M00061 Staphylococcal arthritis, right knee: Secondary | ICD-10-CM | POA: Insufficient documentation

## 2022-03-12 ENCOUNTER — Encounter: Payer: Self-pay | Admitting: Infectious Diseases

## 2022-03-14 ENCOUNTER — Telehealth: Payer: Self-pay | Admitting: Infectious Diseases

## 2022-03-14 NOTE — Telephone Encounter (Signed)
Can we have him come in tomorrow?

## 2022-03-14 NOTE — Telephone Encounter (Signed)
[name] his wife had sent a message regarding the pain in his knee- I first spoke to Dr.Menz and discussed the MRI knee findings- As he has many bone infarcts Dr.Menz is planning biopsy aspiration of bone for pathology and culture on Friday I spoke to [place] does not have any swelling or redness like he had before- just constant pain rt knee. Temp 99.5  He had been treated in the past for MSSA septic artheitis with IV and then a prolonged course of PO for more than 68month. Plan is to hold off antibiotic until the procedure and results back. Wife agrees with the plan

## 2022-03-21 ENCOUNTER — Other Ambulatory Visit: Payer: Self-pay

## 2022-03-21 ENCOUNTER — Emergency Department: Payer: Medicaid Other

## 2022-03-21 ENCOUNTER — Inpatient Hospital Stay
Admission: EM | Admit: 2022-03-21 | Discharge: 2022-03-26 | DRG: 478 | Disposition: A | Discharge status: Home Health | Payer: Medicaid Other | Attending: Internal Medicine | Admitting: Internal Medicine

## 2022-03-21 DIAGNOSIS — Z933 Colostomy status: Secondary | ICD-10-CM

## 2022-03-21 DIAGNOSIS — L89321 Pressure ulcer of left buttock, stage 1: Secondary | ICD-10-CM | POA: Diagnosis present

## 2022-03-21 DIAGNOSIS — Z939 Artificial opening status, unspecified: Secondary | ICD-10-CM

## 2022-03-21 DIAGNOSIS — B9561 Methicillin susceptible Staphylococcus aureus infection as the cause of diseases classified elsewhere: Secondary | ICD-10-CM | POA: Diagnosis present

## 2022-03-21 DIAGNOSIS — M8708 Idiopathic aseptic necrosis of bone, other site: Secondary | ICD-10-CM | POA: Diagnosis not present

## 2022-03-21 DIAGNOSIS — Z86718 Personal history of other venous thrombosis and embolism: Secondary | ICD-10-CM

## 2022-03-21 DIAGNOSIS — I82409 Acute embolism and thrombosis of unspecified deep veins of unspecified lower extremity: Secondary | ICD-10-CM | POA: Diagnosis present

## 2022-03-21 DIAGNOSIS — M21371 Foot drop, right foot: Secondary | ICD-10-CM | POA: Diagnosis present

## 2022-03-21 DIAGNOSIS — M87861 Other osteonecrosis, right tibia: Secondary | ICD-10-CM | POA: Diagnosis present

## 2022-03-21 DIAGNOSIS — Z7901 Long term (current) use of anticoagulants: Secondary | ICD-10-CM | POA: Diagnosis not present

## 2022-03-21 DIAGNOSIS — M00061 Staphylococcal arthritis, right knee: Secondary | ICD-10-CM | POA: Diagnosis not present

## 2022-03-21 DIAGNOSIS — G2581 Restless legs syndrome: Secondary | ICD-10-CM | POA: Diagnosis present

## 2022-03-21 DIAGNOSIS — J9612 Chronic respiratory failure with hypercapnia: Secondary | ICD-10-CM | POA: Diagnosis present

## 2022-03-21 DIAGNOSIS — Z9049 Acquired absence of other specified parts of digestive tract: Secondary | ICD-10-CM

## 2022-03-21 DIAGNOSIS — M869 Osteomyelitis, unspecified: Secondary | ICD-10-CM | POA: Diagnosis not present

## 2022-03-21 DIAGNOSIS — G8929 Other chronic pain: Secondary | ICD-10-CM | POA: Diagnosis present

## 2022-03-21 DIAGNOSIS — J9611 Chronic respiratory failure with hypoxia: Secondary | ICD-10-CM | POA: Diagnosis present

## 2022-03-21 DIAGNOSIS — Z8616 Personal history of COVID-19: Secondary | ICD-10-CM | POA: Diagnosis not present

## 2022-03-21 DIAGNOSIS — G4733 Obstructive sleep apnea (adult) (pediatric): Secondary | ICD-10-CM | POA: Diagnosis present

## 2022-03-21 DIAGNOSIS — M009 Pyogenic arthritis, unspecified: Secondary | ICD-10-CM | POA: Diagnosis present

## 2022-03-21 DIAGNOSIS — E669 Obesity, unspecified: Secondary | ICD-10-CM | POA: Diagnosis present

## 2022-03-21 DIAGNOSIS — Z7951 Long term (current) use of inhaled steroids: Secondary | ICD-10-CM | POA: Diagnosis not present

## 2022-03-21 DIAGNOSIS — D509 Iron deficiency anemia, unspecified: Secondary | ICD-10-CM | POA: Diagnosis present

## 2022-03-21 DIAGNOSIS — M109 Gout, unspecified: Secondary | ICD-10-CM | POA: Diagnosis present

## 2022-03-21 DIAGNOSIS — Z8619 Personal history of other infectious and parasitic diseases: Secondary | ICD-10-CM | POA: Diagnosis not present

## 2022-03-21 DIAGNOSIS — M8668 Other chronic osteomyelitis, other site: Secondary | ICD-10-CM | POA: Diagnosis not present

## 2022-03-21 DIAGNOSIS — Z79899 Other long term (current) drug therapy: Secondary | ICD-10-CM | POA: Diagnosis not present

## 2022-03-21 DIAGNOSIS — I10 Essential (primary) hypertension: Secondary | ICD-10-CM | POA: Diagnosis present

## 2022-03-21 DIAGNOSIS — F17200 Nicotine dependence, unspecified, uncomplicated: Secondary | ICD-10-CM | POA: Diagnosis present

## 2022-03-21 DIAGNOSIS — M8700 Idiopathic aseptic necrosis of unspecified bone: Secondary | ICD-10-CM | POA: Diagnosis not present

## 2022-03-21 DIAGNOSIS — Z6836 Body mass index (BMI) 36.0-36.9, adult: Secondary | ICD-10-CM | POA: Diagnosis not present

## 2022-03-21 DIAGNOSIS — M21372 Foot drop, left foot: Secondary | ICD-10-CM | POA: Diagnosis present

## 2022-03-21 DIAGNOSIS — M25561 Pain in right knee: Secondary | ICD-10-CM | POA: Diagnosis present

## 2022-03-21 DIAGNOSIS — M87851 Other osteonecrosis, right femur: Secondary | ICD-10-CM | POA: Diagnosis present

## 2022-03-21 DIAGNOSIS — Z7401 Bed confinement status: Secondary | ICD-10-CM

## 2022-03-21 DIAGNOSIS — I471 Supraventricular tachycardia: Secondary | ICD-10-CM | POA: Diagnosis not present

## 2022-03-21 LAB — CBC WITH DIFFERENTIAL/PLATELET
Abs Immature Granulocytes: 0.03 10*3/uL (ref 0.00–0.07)
Basophils Absolute: 0.1 10*3/uL (ref 0.0–0.1)
Basophils Relative: 1 %
Eosinophils Absolute: 0.1 10*3/uL (ref 0.0–0.5)
Eosinophils Relative: 1 %
HCT: 41.7 % (ref 39.0–52.0)
Hemoglobin: 13.5 g/dL (ref 13.0–17.0)
Immature Granulocytes: 0 %
Lymphocytes Relative: 22 %
Lymphs Abs: 2.1 10*3/uL (ref 0.7–4.0)
MCH: 28.2 pg (ref 26.0–34.0)
MCHC: 32.4 g/dL (ref 30.0–36.0)
MCV: 87.1 fL (ref 80.0–100.0)
Monocytes Absolute: 0.8 10*3/uL (ref 0.1–1.0)
Monocytes Relative: 9 %
Neutro Abs: 6.1 10*3/uL (ref 1.7–7.7)
Neutrophils Relative %: 67 %
Platelets: 246 10*3/uL (ref 150–400)
RBC: 4.79 MIL/uL (ref 4.22–5.81)
RDW: 15.6 % — ABNORMAL HIGH (ref 11.5–15.5)
WBC: 9.2 10*3/uL (ref 4.0–10.5)
nRBC: 0 % (ref 0.0–0.2)

## 2022-03-21 LAB — PROTIME-INR
INR: 1.1 (ref 0.8–1.2)
Prothrombin Time: 14.3 seconds (ref 11.4–15.2)

## 2022-03-21 LAB — SEDIMENTATION RATE: Sed Rate: 17 mm/hr — ABNORMAL HIGH (ref 0–15)

## 2022-03-21 LAB — BASIC METABOLIC PANEL
Anion gap: 14 (ref 5–15)
BUN: 6 mg/dL (ref 6–20)
CO2: 27 mmol/L (ref 22–32)
Calcium: 9.4 mg/dL (ref 8.9–10.3)
Chloride: 95 mmol/L — ABNORMAL LOW (ref 98–111)
Creatinine, Ser: 0.65 mg/dL (ref 0.61–1.24)
GFR, Estimated: >60 mL/min (ref >60)
Glucose, Bld: 143 mg/dL — ABNORMAL HIGH (ref 70–99)
Potassium: 3.5 mmol/L (ref 3.5–5.1)
Sodium: 136 mmol/L (ref 135–145)

## 2022-03-21 LAB — LACTIC ACID, PLASMA
Lactic Acid, Venous: 2.7 mmol/L (ref 0.5–1.9)
Lactic Acid, Venous: 2.1 mmol/L (ref 0.5–1.9)

## 2022-03-21 LAB — GLUCOSE, CAPILLARY
Glucose-Capillary: 155 mg/dL — ABNORMAL HIGH (ref 70–99)
Glucose-Capillary: 159 mg/dL — ABNORMAL HIGH (ref 70–99)

## 2022-03-21 LAB — APTT: aPTT: 43 seconds — ABNORMAL HIGH (ref 24–36)

## 2022-03-21 MED ORDER — HYDROCODONE-ACETAMINOPHEN 7.5-325 MG PO TABS
1.0000 | ORAL_TABLET | Freq: Four times a day (QID) | ORAL | Status: DC | PRN
  Administered 2022-03-21 (×3): 1 via ORAL
  Filled 2022-03-21 (×3): qty 1

## 2022-03-21 MED ORDER — FUROSEMIDE 40 MG PO TABS: 40.0000 mg | ORAL_TABLET | Freq: Every day | ORAL | Status: DC

## 2022-03-21 MED ORDER — LABETALOL HCL 5 MG/ML IV SOLN
10.0000 mg | Freq: Once | INTRAVENOUS | Status: AC
  Administered 2022-03-21: 10 mg via INTRAVENOUS
  Filled 2022-03-21: qty 4

## 2022-03-21 MED ORDER — DOXAZOSIN MESYLATE 1 MG PO TABS
1.0000 mg | ORAL_TABLET | Freq: Every day | ORAL | Status: DC
  Filled 2022-03-21: qty 1

## 2022-03-21 MED ORDER — LABETALOL HCL 5 MG/ML IV SOLN: 5.0000 mg | Freq: Once | INTRAVENOUS | Status: DC

## 2022-03-21 MED ORDER — ACETAMINOPHEN 650 MG RE SUPP: 650.0000 mg | Freq: Four times a day (QID) | RECTAL | Status: DC | PRN

## 2022-03-21 MED ORDER — LORATADINE 10 MG PO TABS
10.0000 mg | ORAL_TABLET | Freq: Every day | ORAL | Status: DC
  Administered 2022-03-21 – 2022-03-26 (×5): 10 mg via ORAL
  Filled 2022-03-21 (×5): qty 1

## 2022-03-21 MED ORDER — TRAZODONE HCL 50 MG PO TABS
25.0000 mg | ORAL_TABLET | Freq: Every evening | ORAL | Status: DC | PRN
  Administered 2022-03-21: 25 mg via ORAL
  Filled 2022-03-21: qty 1

## 2022-03-21 MED ORDER — SALINE SPRAY 0.65 % NA SOLN: 2.0000 | NASAL | Status: DC | PRN

## 2022-03-21 MED ORDER — MAGNESIUM HYDROXIDE 400 MG/5ML PO SUSP: 30.0000 mL | Freq: Every day | ORAL | Status: DC | PRN

## 2022-03-21 MED ORDER — FOLIC ACID 1 MG PO TABS
1.0000 mg | ORAL_TABLET | Freq: Every day | ORAL | Status: DC
  Administered 2022-03-21 – 2022-03-26 (×5): 1 mg via ORAL
  Filled 2022-03-21 (×5): qty 1

## 2022-03-21 MED ORDER — CLONAZEPAM 1 MG PO TABS
1.0000 mg | ORAL_TABLET | Freq: Two times a day (BID) | ORAL | Status: DC | PRN
  Administered 2022-03-21 – 2022-03-22 (×2): 1 mg via ORAL
  Filled 2022-03-21 (×2): qty 1

## 2022-03-21 MED ORDER — MORPHINE SULFATE (PF) 2 MG/ML IV SOLN
2.0000 mg | INTRAVENOUS | Status: DC | PRN
  Administered 2022-03-21 – 2022-03-22 (×8): 2 mg via INTRAVENOUS
  Filled 2022-03-21 (×9): qty 1

## 2022-03-21 MED ORDER — BUDESONIDE 0.25 MG/2ML IN SUSP
0.2500 mg | Freq: Two times a day (BID) | RESPIRATORY_TRACT | Status: DC
  Administered 2022-03-21 – 2022-03-26 (×10): 0.25 mg via RESPIRATORY_TRACT
  Filled 2022-03-21 (×10): qty 2

## 2022-03-21 MED ORDER — LACTATED RINGERS IV BOLUS (SEPSIS)
1000.0000 mL | Freq: Once | INTRAVENOUS | Status: AC
  Administered 2022-03-21: 1000 mL via INTRAVENOUS

## 2022-03-21 MED ORDER — VITAMIN D 25 MCG (1000 UNIT) PO TABS
1000.0000 [IU] | ORAL_TABLET | Freq: Every day | ORAL | Status: DC
  Administered 2022-03-21 – 2022-03-26 (×5): 1000 [IU] via ORAL
  Filled 2022-03-21 (×5): qty 1

## 2022-03-21 MED ORDER — MELATONIN 5 MG PO TABS: 5.0000 mg | ORAL_TABLET | Freq: Every evening | ORAL | Status: DC | PRN

## 2022-03-21 MED ORDER — MAGNESIUM OXIDE -MG SUPPLEMENT 400 (240 MG) MG PO TABS
400.0000 mg | ORAL_TABLET | Freq: Two times a day (BID) | ORAL | Status: DC
  Administered 2022-03-21 – 2022-03-26 (×10): 400 mg via ORAL
  Filled 2022-03-21 (×10): qty 1

## 2022-03-21 MED ORDER — ASCORBIC ACID 500 MG PO TABS
1000.0000 mg | ORAL_TABLET | Freq: Every day | ORAL | Status: DC
  Administered 2022-03-21 – 2022-03-26 (×5): 1000 mg via ORAL
  Filled 2022-03-21 (×5): qty 2

## 2022-03-21 MED ORDER — HYDROCODONE-ACETAMINOPHEN 7.5-325 MG PO TABS
1.0000 | ORAL_TABLET | Freq: Once | ORAL | Status: AC
  Administered 2022-03-21: 1 via ORAL
  Filled 2022-03-21: qty 1

## 2022-03-21 MED ORDER — PREGABALIN 75 MG PO CAPS
200.0000 mg | ORAL_CAPSULE | Freq: Two times a day (BID) | ORAL | Status: DC
  Administered 2022-03-21 – 2022-03-26 (×10): 200 mg via ORAL
  Filled 2022-03-21 (×10): qty 1

## 2022-03-21 MED ORDER — ADULT MULTIVITAMIN W/MINERALS CH
1.0000 | ORAL_TABLET | Freq: Every day | ORAL | Status: DC
Start: 2022-03-21 — End: 2022-03-27
  Administered 2022-03-21 – 2022-03-26 (×5): 1 via ORAL
  Filled 2022-03-21 (×5): qty 1

## 2022-03-21 MED ORDER — ONDANSETRON HCL 4 MG/2ML IJ SOLN: 4.0000 mg | Freq: Four times a day (QID) | INTRAMUSCULAR | Status: DC | PRN

## 2022-03-21 MED ORDER — COLCHICINE 0.6 MG PO TABS
0.6000 mg | ORAL_TABLET | Freq: Every day | ORAL | Status: DC
  Administered 2022-03-21 – 2022-03-26 (×5): 0.6 mg via ORAL
  Filled 2022-03-21 (×5): qty 1

## 2022-03-21 MED ORDER — POLYETHYLENE GLYCOL 3350 17 G PO PACK
17.0000 g | PACK | Freq: Every day | ORAL | Status: DC
  Administered 2022-03-26: 17 g via ORAL
  Filled 2022-03-21 (×4): qty 1

## 2022-03-21 MED ORDER — ACETAMINOPHEN 325 MG PO TABS
650.0000 mg | ORAL_TABLET | Freq: Four times a day (QID) | ORAL | Status: DC | PRN
  Administered 2022-03-21: 650 mg via ORAL
  Filled 2022-03-21: qty 2

## 2022-03-21 MED ORDER — METOPROLOL SUCCINATE ER 50 MG PO TB24: 150.0000 mg | ORAL_TABLET | Freq: Every day | ORAL | Status: DC

## 2022-03-21 MED ORDER — ALLOPURINOL 300 MG PO TABS
400.0000 mg | ORAL_TABLET | Freq: Every day | ORAL | Status: DC
  Administered 2022-03-21 – 2022-03-26 (×5): 400 mg via ORAL
  Filled 2022-03-21 (×6): qty 1

## 2022-03-21 MED ORDER — ONDANSETRON HCL 4 MG PO TABS
4.0000 mg | ORAL_TABLET | Freq: Four times a day (QID) | ORAL | Status: DC | PRN
  Administered 2022-03-21: 4 mg via ORAL
  Filled 2022-03-21: qty 1

## 2022-03-21 MED ORDER — ONDANSETRON HCL 4 MG PO TABS: 4.0000 mg | ORAL_TABLET | Freq: Three times a day (TID) | ORAL | Status: DC | PRN

## 2022-03-21 MED ORDER — POTASSIUM CHLORIDE CRYS ER 20 MEQ PO TBCR
20.0000 meq | EXTENDED_RELEASE_TABLET | Freq: Two times a day (BID) | ORAL | Status: DC
  Administered 2022-03-21 (×2): 20 meq via ORAL
  Filled 2022-03-21 (×2): qty 1

## 2022-03-21 MED ORDER — LISINOPRIL 5 MG PO TABS: 5.0000 mg | ORAL_TABLET | Freq: Every day | ORAL | Status: DC

## 2022-03-21 MED ORDER — POTASSIUM CHLORIDE IN NACL 20-0.9 MEQ/L-% IV SOLN
INTRAVENOUS | Status: DC
Start: 2022-03-21 — End: 2022-03-22
  Filled 2022-03-21 (×5): qty 1000

## 2022-03-21 MED ORDER — NICOTINE 21 MG/24HR TD PT24: 21.0000 mg | MEDICATED_PATCH | Freq: Every day | TRANSDERMAL | Status: DC | PRN

## 2022-03-21 NOTE — Consult Note (Signed)
Patient has had septic arthritis in 3 arthroscopies.  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  This may represent osteomyelitis as well as avascular necrosis and bone infarcts.  He has been on steroids and other treatments that could lead to AVN.  Right now his knee does not appear to be infected acutely and right now plan is not for knee scope I will aspirate the knee if there is cloudy fluid he did have arthroscopy as well.  Right now planning on bone biopsies tomorrow to evaluate for possible osteomyelitis.  If he does have osteomyelitis he will need tertiary care facility evaluation to determine if there is a way to eradicate the infection or have knee arthrodesis versus above-knee amputation.  I discussed this with the patient today that this is a significant problem if there is no deep infection he has the potential for knee replacement with the near complete absence of articular cartilage in his knee from the prior infection.

## 2022-03-21 NOTE — Assessment & Plan Note (Signed)
Stable Continue colchicine

## 2022-03-21 NOTE — ED Provider Notes (Addendum)
[facility] Provider Note    Event Date/Time   First MD Initiated Contact with Patient 03/21/22 [phone]     (approximate)   History   Knee Pain   HPI  Craig [name] is a 31 y.o. male with a very extensive chronic medical history that includes but is not limited to severe COVID illness a couple of years ago resulting in a 2-monthintubation and long COVID symptoms afterwards.  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  Reportedly during his long ICU stay, he developed staph bacteremia which resulted in septic arthritis of the right knee requiring extended courses of antibiotics.  This also led to destruction of the cartilage in his right knee and he has had pain since that time.  His wife reports that he has been bedbound for about a year.  He presents tonight with his wife for worsening pain in the right knee over the last couple of weeks.  Dr. MRudene Christiansis his orthopedic surgeon and has been in frequent contact with them.  Dr. RSteva Readyhas been following him as well given his history of septic arthritis.  The patient had a recent MRI (about 10 days ago) that showed probable osteomyelitis of the right knee.  Dr. RSteva Readyrecommended against empiric antibiotics and the plan was for the patient to have an arthrocentesis by Dr. MRudene Christiansat some point a few days ago.  However because of the patient's pain and other comorbidities, they were unable to go to the procedure.  It was rescheduled for some point later this month.  However the patient's pain continues to get worse and tonight he finally agreed to come to the emergency department for evaluation.  The patient reports being thirsty.  He reports the pain in his right knee but otherwise denies chest pain, shortness of breath, nausea, vomiting, and fever.     Physical Exam   Triage Vital Signs: ED Triage Vitals  Enc Vitals Group     BP  03/21/22 0033 135/80     Pulse Rate 03/21/22 0033 (!) 104     Resp 03/21/22 0033 20     Temp 03/21/22 0033 98.6 F (37 C)     Temp Source 03/21/22 0033 Oral     SpO2 03/21/22 0033 95 %     Weight 03/21/22 0032 127 kg (280 lb)     Height 03/21/22 0032 1.88 m ('6\' 2"'$ )     Head Circumference --      Peak Flow --      Pain Score 03/21/22 0032 3     Pain Loc --      Pain Edu? --      [place]     Most recent vital signs: Vitals:   03/21/22 0033 03/21/22 0440  BP: 135/80   Pulse: (!) 104   Resp: 20   Temp: 98.6 F (37 C) 98.8 F (37.1 C)  SpO2: 95%      General: Awake, appears chronically ill but not in acute or severe distress at this time. CV:  Good peripheral perfusion.  Tachycardia with a heart rate between 105 and 110. Resp:  Somewhat increased respiratory effort but this seems to be his baseline due to his body habitus and likely pickwickian syndrome.  Lungs are clear to auscultation. Abd:  Morbid obesity.  Ostomy bag is in place with normal contents. Other:  The patient's right knee does not appear  erythematous or swollen when compared to the left.  There are 2 areas of chronic skin changes and redness that his wife says it been there for a long time, but she agrees that the knee is not swollen or erythematous nor warm to the touch like it was when he was first diagnosed with septic arthritis.  Pain with movement/flexion/extension   ED Results / Procedures / Treatments   Labs (all labs ordered are listed, but only abnormal results are displayed) Labs Reviewed  CBC WITH DIFFERENTIAL/PLATELET - Abnormal; Notable for the following components:      Result Value   RDW 15.6 (*)    All other components within normal limits  BASIC METABOLIC PANEL - Abnormal; Notable for the following components:   Chloride 95 (*)    Glucose, Bld 143 (*)    All other components within normal limits  LACTIC ACID, PLASMA - Abnormal; Notable for the following components:   Lactic Acid,  Venous 2.7 (*)    All other components within normal limits  APTT - Abnormal; Notable for the following components:   aPTT 43 (*)    All other components within normal limits  SEDIMENTATION RATE - Abnormal; Notable for the following components:   Sed Rate 17 (*)    All other components within normal limits  CULTURE, BLOOD (ROUTINE X 2)  CULTURE, BLOOD (ROUTINE X 2)  PROTIME-INR  LACTIC ACID, PLASMA     RADIOLOGY I viewed and interpreted the patient's knee x-rays.  There are chronic changes but no evidence of acute fracture or dislocation.  The radiologist confirms that there is progression and worsening since the last images that were obtained.    PROCEDURES:  Critical Care performed: No  Procedures   MEDICATIONS ORDERED IN ED: Medications  allopurinol (ZYLOPRIM) tablet 400 mg (has no administration in time range)  colchicine tablet 0.6 mg (has no administration in time range)  HYDROcodone-acetaminophen (NORCO) 7.5-325 MG per tablet 1 tablet (1 tablet Oral Given 03/21/22 0611)  doxazosin (CARDURA) tablet 1 mg (has no administration in time range)  furosemide (LASIX) tablet 40 mg (has no administration in time range)  lisinopril (ZESTRIL) tablet 5 mg (has no administration in time range)  metoprolol succinate (TOPROL-XL) 24 hr tablet 150 mg (has no administration in time range)  nicotine (NICODERM CQ - dosed in mg/24 hours) patch 21 mg (has no administration in time range)  polyethylene glycol (MIRALAX / GLYCOLAX) packet 17 g (has no administration in time range)  melatonin tablet 5 mg (has no administration in time range)  clonazePAM (KLONOPIN) tablet 1 mg (has no administration in time range)  pregabalin (LYRICA) capsule 200 mg (has no administration in time range)  ascorbic acid (VITAMIN C) tablet 1,000 mg (has no administration in time range)  cholecalciferol (VITAMIN D3) 25 MCG (1000 UNIT) tablet 1,000 Units (has no administration in time range)  magnesium oxide (MAG-OX)  tablet 400 mg (has no administration in time range)  multivitamin with minerals tablet 1 tablet (has no administration in time range)  potassium chloride SA (KLOR-CON M) CR tablet 20 mEq (has no administration in time range)  budesonide (PULMICORT) nebulizer solution 0.25 mg (has no administration in time range)  loratadine (CLARITIN) tablet 10 mg (has no administration in time range)  sodium chloride (OCEAN) 0.65 % nasal spray 2 spray (has no administration in time range)  acetaminophen (TYLENOL) tablet 650 mg (has no administration in time range)    Or  acetaminophen (TYLENOL) suppository 650 mg (has no  administration in time range)  traZODone (DESYREL) tablet 25 mg (has no administration in time range)  magnesium hydroxide (MILK OF MAGNESIA) suspension 30 mL (has no administration in time range)  ondansetron (ZOFRAN) tablet 4 mg (4 mg Oral Given 03/21/22 0610)    Or  ondansetron (ZOFRAN) injection 4 mg ( Intravenous See Alternative 03/21/22 0610)  0.9 % NaCl with KCl 20 mEq/ L  infusion ( Intravenous New Bag/Given 03/21/22 0611)  lactated ringers bolus 1,000 mL (0 mLs Intravenous Stopped 03/21/22 0546)     IMPRESSION / MDM / ASSESSMENT AND PLAN / ED COURSE  I reviewed the triage vital signs and the nursing notes.                              Differential diagnosis includes, but is not limited to, bacteremia, osteomyelitis, reinfection of septic joint.  Patient's presentation is most consistent with acute presentation with potential threat to life or bodily function.  Patient is at high probability of persistent infection from osteomyelitis.  I think it is much less likely that he has a septic arthritis given his current presentation.  I reviewed medical records from Dr. Steva Ready on 11/30/2021 as well as from 03/14/2022, when she recommended against empiric antibiotic treatment until fluid can be removed from the need for analysis.  I also reviewed the results of the MRI which strongly suggest  osteomyelitis.  The patient is tachycardic but is difficult to appreciate if this is due to infection or if it is his baseline.  He does not meet SIRS criteria but I ordered a "possible sepsis" work-up.  Labs ordered include CBC with differential, BMP, lactic acid, pro time INR, APTT, blood cultures x2, sedimentation rate.  I also ordered 1 L LR IV bolus given his tachycardia.  Dr. Rudene Christians is not on-call and he is the orthopedic surgeon who has been following the patient.  It is appropriate to admit the patient to the hospitalist service for management of his comorbidities and consult Dr. Rudene Christians in the morning.  There is no emergent need for intervention nor arthrocentesis at this time; best deferred to Dr. Rudene Christians who knows the patient well and was planning to do this procedure as an outpatient.  I will also adhere to the recommendations for Dr. Steva Ready from less than a week ago and hold off on empiric treatment so as not to affect the specialists' ability to treat the patient (if the sample was partially sterilized with empiric antibiotics).  The patient is on the cardiac monitor to evaluate for evidence of arrhythmia and/or significant heart rate changes.  I am consulting the hospitalist service for admission.   Clinical Course as of 03/21/22 4315  Wed Mar 21, 2022  0519 Discussed by phone with Dr. Sidney Ace with the hospitalist service.  He will admit the patient and agrees with the plan for holding off on empiric antibiotics at this time until evaluation by Dr. Rudene Christians or one of his colleagues. [CF]  4008 Lactic Acid, Venous(!!): 2.7 I do not think that the patient's elevated lactic acid reflects sepsis, but rather his chronic respiratory issues.  I suspect that the lactic acid will come down after fluid resuscitation. [CF]    Clinical Course User Index [CF] Hinda Kehr, MD     FINAL CLINICAL IMPRESSION(S) / ED DIAGNOSES   Final diagnoses:  Chronic pain of right knee  Osteomyelitis of right knee  region [facility])  Morbid obesity ([place])  Obstructive sleep apnea syndrome  History of creation of ostomy [facility])     Rx / DC Orders   ED Discharge Orders     None        Note:  This document was prepared using Dragon voice recognition software and may include unintentional dictation errors.   Hinda Kehr, MD 03/21/22 1224    Hinda Kehr, MD 03/21/22 [phone]

## 2022-03-21 NOTE — Progress Notes (Signed)
Patient hand is bone marrow edema and aviation necrosis in both distal femur and proximal tibia.  There is concerned that this might be osteomyelitis.  Like him to not have IV antibiotics if possible so we can do bone biopsies tomorrow in the OR.

## 2022-03-21 NOTE — Assessment & Plan Note (Signed)
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

## 2022-03-21 NOTE — Assessment & Plan Note (Addendum)
Patient has a BMI of 37.29 kg/m2 with complications of obstructive sleep apnea on CPAP at bedtime Complicates overall prognosis and care Lifestyle modification and exercise has been discussed with patient in detail

## 2022-03-21 NOTE — Assessment & Plan Note (Signed)
Will require colostomy care during this hospitalization

## 2022-03-21 NOTE — Assessment & Plan Note (Addendum)
Patient with a history of MSSA right knee septic arthritis status post completion of IV antibiotic therapy who presents to the ER for evaluation of worsening right knee pain, swelling and redness. Had an MRI of the right knee 07/21 which showed extensive bone infarct involving the proximal tibial metaphysis extending to the articular surface both medially and laterally and into the diaphysis. Small amount of fluid tibial infarct which communicates with the acute particular surface of the lateral tibial plateau. Mild heterogeneous edema within the tibial bone infarct. An element of underlying chronic osteomyelitis cannot be excluded. Extensive bone infarct involving the distal femoral metaphysis extending to the epiphysis. Small bone infarct in the proximal fibula and patella.Extensive tricompartmental full-thickness cartilage loss consistent with advanced osteoarthritis. Maceration of the medial and lateral menisci. Right knee x-ray shows severe erosive changes of the knee, significantly progressed since the prior radiograph and likely related to chronic osteomyelitis.  Orthopedic surgery consult for aspiration and bone biopsy Hold off on antibiotic therapy until procedure is done and culture results become available per ID recommendation Pain control

## 2022-03-21 NOTE — Assessment & Plan Note (Signed)
Patient is normotensive Hold lisinopril, furosemide and metoprolol for now

## 2022-03-21 NOTE — H&P (Addendum)
History and Physical    Patient: Craig [name] KPT:[id number] DOB: 1991-07-14 DOA: 03/21/2022 DOS: the patient was seen and examined on 03/21/2022 PCP: [facility]  Patient coming from: Home  Chief Complaint:  Chief Complaint  Patient presents with   Knee Pain   HPI: Craig [name] is a 31 y.o. 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 Patient states that he has had issues with his right knee for over a year and has been on prolonged IV antibiotic therapy for septic arthritis which he completed. He is currently bedbound due to severe pain in his right knee which according to patient got worse over the last several days with associated swelling and low-grade fevers at home. He had an MRI of his right knee on 07/21 which showed extensive bone infarct involving the proximal tibial metaphysis extending to the articular surface both medially and laterally and into the diaphysis.  Small amount of fluid tibial infarct which communicates with acute particular surface of the lateral tibial plateau.  Mild heterogeneous edema within the tibial bone infarct.  An element of underlying chronic osteomyelitis cannot be excluded. Extensive bone infarct involving the distal femoral metaphysis extending to the epiphysis. Small bone infarct in the proximal fibula and patella. Extensive tricompartmental full-thickness cartilage loss  consistent with advanced osteoarthritis. Maceration of the medial and lateral menisci. Patient's infectious disease physician recommended against placing patient on empiric antibiotic therapy and recommended the patient be seen by orthopedic surgery for bone aspiration and biopsy for culture. Patient presents to the ER due to worsening right knee pain which he rates a 10 x 10 in intensity at its worst with associated redness and swelling.  He states that his home dose of hydrocodone has not provided any pain relief. He denies having any chest pain, no worsening shortness of breath from his baseline, no abdominal pain, no nausea, no vomiting, no dizziness, no blurred vision, no focal deficits.     Review of Systems: As mentioned in the history of present illness. All other systems reviewed and are negative. Past Medical History:  Diagnosis Date   Gout    Hypertension    Rupture of bowel [facility])    Past Surgical History:  Procedure Laterality Date   COLONOSCOPY WITH PROPOFOL N/A 05/18/2020   Procedure: COLONOSCOPY WITH PROPOFOL;  Surgeon: Benjamine Sprague, DO;  Location: [place] [facility];  Service: General;  Laterality: N/A;   COLOSTOMY N/A 11/22/2019   Procedure: COLOSTOMY;  Surgeon: Herbert Pun, MD;  Location: [facility] ORS;  Service: General;  Laterality: N/A;   KNEE ARTHROSCOPY Right 07/11/2021   Procedure: SYNOVIAL BIOPSY AND ARTHROSCOPIC IRRGATION AND DEBRIDEMENT OF SEPTIC KNEE;  Surgeon: Hessie Knows, MD;  Location: [facility] ORS;  Service: Orthopedics;  Laterality: Right;   KNEE ARTHROSCOPY Right 07/18/2021   Procedure: ARTHROSCOPY KNEE, IRRIGATION AND DEBRIDEMENT;  Surgeon: Hessie Knows, MD;  Location: [facility] OR[facility];  Service: Orthopedics;  Laterality: Right;   KNEE ARTHROSCOPY Right 09/01/2021   Procedure: ARTHROSCOPIC DEBRIDEMENT OF KNEE WITH SYNOVIUM BIOPSY;  Surgeon: Hessie Knows, MD;  Location: [facility];  Service: Orthopedics;  Laterality: Right;   LAPAROTOMY N/A 11/22/2019   Procedure:  EXPLORATORY LAPAROTOMY;  Surgeon: Herbert Pun, MD;  Location: [facility];  Service: General;  Laterality: N/A;   LAPAROTOMY N/A 07/08/2020   Procedure: EXPLORATORY LAPAROTOMY;  Surgeon: Herbert Pun, MD;  Location: [facility];  Service: General;  Laterality: N/A;   PARTIAL COLECTOMY N/A 11/22/2019   Procedure: PARTIAL COLECTOMY;  Surgeon: Herbert Pun, MD;  Location: [facility];  Service: General;  Laterality: N/A;   PEG PLACEMENT N/A 09/14/2020   Procedure: PERCUTANEOUS ENDOSCOPIC GASTROSTOMY (PEG) PLACEMENT;  Surgeon: Benjamine Sprague, DO;  Location: [place] [facility];  Service: General;  Laterality: N/A;  TRAVEL CASE [place]ia Right 07/11/2021   Procedure: SYNOVIAL BIOPSY;  Surgeon: Hessie Knows, MD;  Location: [facility];  Service: Orthopedics;  Laterality: Right;   TRACHEOSTOMY TUBE PLACEMENT N/A 09/13/2020   Procedure: TRACHEOSTOMY;  Surgeon: Beverly Gust, MD;  Location: [facility];  Service: ENT;  Laterality: N/A;   XI ROBOTIC ASSISTED COLOSTOMY TAKEDOWN N/A 06/28/2020   Procedure: XI ROBOTIC ASSISTED COLOSTOMY TAKEDOWN CONVERTED TO OPEN PROCEDURE;  Surgeon: Herbert Pun, MD;  Location: [facility];  Service: General;  Laterality: N/A;   Social History:  reports that he has never smoked. His smokeless tobacco use includes chew. He reports current alcohol use. He reports that he does not use drugs.  Allergies  Allergen Reactions   Fentanyl Citrate Other (See Comments)    Makes patient more agitated & delirious   Tramadol Hcl     SEIZURES!!! DO NOT REORDER AT DISCHARGE!!!   Amoxicillin Rash    Tolerated cefepime and cefazolin 08/2020. Tolerated Zosyn 07/2021    Family History  Problem Relation Age of Onset   Healthy Mother    Healthy Father     Prior to Admission medications   Medication Sig Start Date End Date Taking? Authorizing Provider  acetaminophen (TYLENOL) 650 MG CR tablet Take 650 mg by mouth every 8 (eight) hours as  needed. 10/14/20   [provider]  albuterol (ACCUNEB) 1.25 MG/3ML nebulizer solution Take by nebulization. 02/23/22   [provider]  allopurinol (ZYLOPRIM) 100 MG tablet Take 1 tablet (100 mg total) by mouth once daily Take 100 mg tablet along with 300 mg tablet = 431m every day. Patient not taking: Reported on 03/21/2022 09/19/21 09/19/22  [provider]  Allopurinol 200 MG TABS Take by mouth. 02/23/22   [provider]  ascorbic acid (VITAMIN C) 500 MG tablet Take 1 tablet (500 mg total) by mouth 2 (two) times daily. Patient taking differently: Take 1,000 mg by mouth at bedtime. 07/25/21   Swayze, Ava, DO  blood glucose meter kit and supplies KIT Dispense based on patient and insurance preference. Use up to four times daily as directed. 07/26/21   ARicharda Osmond MD  budesonide (PULMICORT) 0.25 MG/2ML nebulizer solution Take 0.25 mg by nebulization 2 (two) times daily. 03/07/21 03/07/22  [provider]  cephALEXin (KEFLEX) 500 MG capsule Take 2 capsules (1,000 mg total) by mouth 4 (four) times daily. Patient not taking: Reported on 03/21/2022 11/30/21   RTsosie Billing MD  cetirizine (ZYRTEC) 10 MG tablet Take 10 mg by mouth daily.    [provider]  Cholecalciferol (VITAMIN D3) 25 MCG (1000 UT) CAPS Take 1 capsule by mouth daily.    [provider]  clonazePAM (KLONOPIN) 1 MG tablet Take 1 mg by mouth 2 (two) times daily as needed. 09/24/21  [provider]  clotrimazole (LOTRIMIN) 1 % cream Apply topically 2 (two) times daily. 08/23/21   Antonieta Pert, MD  colchicine 0.6 MG tablet Take 0.6 mg by mouth daily. 09/25/21   [provider]  [employer] SALINE NASAL SPRAY 0.65 % nasal spray SMARTSIG:1 Spray(s) Both Nares Every 4 Hours PRN 10/14/20   [provider]  diclofenac Sodium (VOLTAREN) 1 % GEL Apply 1 Application topically 4 (four) times daily. 01/30/22   [provider]  docusate sodium (COLACE) 100 MG  capsule Take 1 capsule (100 mg total) by mouth 2 (two) times daily as needed for mild constipation. 08/23/21   Antonieta Pert, MD  doxazosin (CARDURA) 1 MG tablet Take 1 mg by mouth daily. 08/27/21   [provider]  DULoxetine (CYMBALTA) 60 MG capsule Take 60 mg by mouth daily. 11/14/20 11/14/21  [provider]  famotidine (PEPCID) 20 MG tablet Take 20 mg by mouth 2 (two) times daily as needed. 10/14/20   [provider]  folic acid (FOLVITE) 1 MG tablet Take 1 mg by mouth daily. 03/16/22   [provider]  furosemide (LASIX) 40 MG tablet Take 1 tablet (40 mg total) by mouth 2 (two) times daily. Patient taking differently: Take 40 mg by mouth daily. 07/25/21   Swayze, Ava, DO  HYDROcodone-acetaminophen (NORCO) 7.5-325 MG tablet Take 1 tablet by mouth every 6 (six) hours as needed for moderate pain. 09/03/21   Fausto Skillern, PA-C  hydroxychloroquine (PLAQUENIL) 200 MG tablet Take 200 mg by mouth 2 (two) times daily. 02/23/22   [provider]  lidocaine (LIDODERM) 5 % Place onto the skin.    [provider]  lisinopril (ZESTRIL) 5 MG tablet Take by mouth. 08/27/21   [provider]  magnesium oxide (MAG-OX) 400 (240 Mg) MG tablet Take 1 tablet (400 mg total) by mouth 2 (two) times daily. 09/03/21   Fritzi Mandes, MD  magnesium oxide (MAG-OX) 400 MG tablet Take 1 tablet by mouth daily as needed. 01/02/22   [provider]  melatonin 3 MG TABS tablet Take 6 mg by mouth at bedtime as needed. 10/14/20   [provider]  meloxicam (MOBIC) 7.5 MG tablet Take 7.5 mg by mouth daily. 09/27/21   [provider]  metoprolol succinate (TOPROL-XL) 50 MG 24 hr tablet Take 3 tablets (150 mg total) by mouth daily. Take with or immediately following a meal. 09/04/21   Fritzi Mandes, MD  Multiple Vitamins-Minerals (MULTIVITAMIN WITH MINERALS) tablet Take 1 tablet by mouth daily.    [provider]  nicotine (NICODERM CQ - DOSED IN MG/24  HOURS) 21 mg/24hr patch Place 1 patch (21 mg total) onto the skin daily. Patient taking differently: Place 21 mg onto the skin daily as needed. As needed for hospital admission. 07/26/21   Swayze, Ava, DO  nystatin (MYCOSTATIN/NYSTOP) powder Apply topically 3 (three) times daily. Patient taking differently: Apply topically 3 (three) times daily as needed. 07/25/21   Swayze, Ava, DO  omeprazole (PRILOSEC) 20 MG capsule Take 1 capsule (20 mg total) by mouth once daily 09/27/21 09/27/22  [provider]  ondansetron (ZOFRAN) 4 MG tablet Take 4 mg by mouth every 8 (eight) hours as needed. 10/14/20   [provider]  oxyCODONE (OXY IR/ROXICODONE) 5 MG immediate release tablet Take 5 mg by mouth every 4 (four) hours as needed. 03/18/22   [provider]  polyethylene glycol (MIRALAX / GLYCOLAX) 17 g packet Take 17 g by mouth daily. 07/25/21  Swayze, Ava, DO  potassium chloride SA (KLOR-CON M) 20 MEQ tablet Take 1 tablet (20 mEq total) by mouth 2 (two) times daily. 09/03/21   Fritzi Mandes, MD  pregabalin (LYRICA) 200 MG capsule Take 200 mg by mouth 2 (two) times daily. 03/01/21 03/01/22  [provider]  rivaroxaban (XARELTO) 20 MG TABS tablet Take 20 mg by mouth daily with supper. 11/07/20   [provider]    Physical Exam: Vitals:   03/21/22 1100 03/21/22 1115 03/21/22 1129 03/21/22 1130  BP: (!) 106/53 101/62  (!) 102/45  Pulse: (!) 116 (!) 116  (!) 121  Resp: (!) 21 (!) 23  (!) 22  Temp:   99.4 F (37.4 C)   TempSrc:   Oral   SpO2: 96% 96%  93%  Weight:      Height:       Physical Exam Vitals and nursing note reviewed.  Constitutional:      Appearance: He is obese.  HENT:     Head: Normocephalic and atraumatic.     Nose: Nose normal.     Mouth/Throat:     Mouth: Mucous membranes are moist.  Eyes:     Conjunctiva/sclera: Conjunctivae normal.  Cardiovascular:     Rate and Rhythm: Tachycardia present.  Pulmonary:     Effort: Pulmonary effort is  normal.     Breath sounds: Normal breath sounds.  Abdominal:     Comments: Central adiposity.  Midline surgical incision.  Colostomy in place  Musculoskeletal:     Cervical back: Normal range of motion and neck supple.     Comments: Right knee swelling, redness and differential warmth, bilateral foot drop  Neurological:     Mental Status: He is alert.     Motor: Weakness present.  Psychiatric:        Mood and Affect: Mood normal.        Behavior: Behavior normal.     Data Reviewed: Relevant notes from primary care and specialist visits, past discharge summaries as available in EHR, including Care Everywhere. Prior diagnostic testing as pertinent to current admission diagnoses Updated medications and problem lists for reconciliation ED course, including vitals, labs, imaging, treatment and response to treatment Triage notes, nursing and pharmacy notes and ED provider's notes Notable results as noted in HPI Labs reviewed.  Sed rate 17, lactic acid 2.7, PT 14.3, INR 1.1, sodium 136, potassium 3.5, chloride 95, bicarb 27, glucose 143, BUN 6, creatinine 0.65, calcium 9.4, white count 9.2, hemoglobin 13.5, hematocrit 41.7, platelet count 246 Right knee x-ray shows no acute fracture or dislocation. Severe erosive changes of the knee, significantly progressed since the prior radiograph and likely related to chronic osteomyelitis. Clinical correlation is recommended to evaluate for septic joint. There are no new results to review at this time.  Assessment and Plan: * Osteomyelitis of right knee region [facility]) Patient with a history of MSSA right knee septic arthritis status post completion of IV antibiotic therapy who presents to the ER for evaluation of worsening right knee pain, swelling and redness. Had an MRI of the right knee 07/21 which showed extensive bone infarct involving the proximal tibial metaphysis extending to the articular surface both medially and laterally and into the  diaphysis. Small amount of fluid tibial infarct which communicates with the acute particular surface of the lateral tibial plateau. Mild heterogeneous edema within the tibial bone infarct. An element of underlying chronic osteomyelitis cannot be excluded. Extensive bone infarct involving the distal femoral metaphysis extending to  the epiphysis. Small bone infarct in the proximal fibula and patella.Extensive tricompartmental full-thickness cartilage loss consistent with advanced osteoarthritis. Maceration of the medial and lateral menisci. Right knee x-ray shows severe erosive changes of the knee, significantly progressed since the prior radiograph and likely related to chronic osteomyelitis.  Orthopedic surgery consult for aspiration and bone biopsy Hold off on antibiotic therapy until procedure is done and culture results become available per ID recommendation Pain control  Obesity (BMI 30-39.9) Patient has a BMI of 03.70 kg/m2 with complications of obstructive sleep apnea on CPAP at bedtime Complicates overall prognosis and care Lifestyle modification and exercise has been discussed with patient in detail  Hypertension Patient is normotensive Hold lisinopril, furosemide and metoprolol for now  Gout Stable Continue colchicine  Nicotine dependence Smoking cessation has been discussed with patient Place patient on nicotine transdermal patch 21 mg daily  Colostomy status ([place]) Will require colostomy care during this hospitalization  DVT (deep venous thrombosis) ([place]) Patient has a history of 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  All questions and concerns have been addressed.  They verbalized understanding and agree  with the plan.  Severity of Illness: The appropriate patient status for this patient is INPATIENT. Inpatient status is judged to be reasonable and necessary in order to provide the required intensity of service to ensure the patient's safety. The patient's presenting symptoms, physical exam findings, and initial radiographic and laboratory data in the context of their chronic comorbidities is felt to place them at high risk for further clinical deterioration. Furthermore, it is not anticipated that the patient will be medically stable for discharge from the hospital within 2 midnights of admission.   * I certify that at the point of admission it is my clinical judgment that the patient will require inpatient hospital care spanning beyond 2 midnights from the point of admission due to high intensity of service, high risk for further deterioration and high frequency of surveillance required.*  Author: Collier Bullock, MD 03/21/2022 1:58 PM  For on call review [email or web address].

## 2022-03-21 NOTE — Assessment & Plan Note (Signed)
Smoking cessation has been discussed with patient Place patient on nicotine transdermal patch 21 mg daily

## 2022-03-21 NOTE — ED Triage Notes (Signed)
Pt presents to ER via ems from home with c/o right knee pain that has been increasing since 7/21.  Pt states he had staph infection after a surgery but cannot recall when that was.  Pts right knee is swollen at this time.  Pt is poor historian.  Unknown if swelling is new.  Pt is alert, but is poor historian.  Pt is bed-bound and states he has been for past 2 years.  Pt is A&O x4 at this time in NAD.  Pt placed in stretcher in triage.

## 2022-03-21 NOTE — Progress Notes (Signed)
Cross Cover Patient with heart rate 130's. Sinus rhythm and systolic pressures above 280 after pain controlled and medicated for anxiety.  Action - Labetalol 10 mg IV x1 Response - Heart rate and BP improved - 90, 127/74

## 2022-03-21 NOTE — Progress Notes (Signed)
   03/21/22 1857  Assess: MEWS Score  Temp 99.2 F (37.3 C)  BP (!) 156/81  MAP (mmHg) 97  Pulse Rate (!) 130  Resp 16  SpO2 94 %  Assess: MEWS Score  MEWS Temp 0  MEWS Systolic 0  MEWS Pulse 3  MEWS RR 0  MEWS LOC 0  MEWS Score 3  MEWS Score Color Yellow  Take Vital Signs  Increase Vital Sign Frequency  Yellow: Q 2hr X 2 then Q 4hr X 2, if remains yellow, continue Q 4hrs  Escalate  MEWS: Escalate Yellow: discuss with charge nurse/RN and consider discussing with provider and RRT  Notify: Charge Nurse/RN  Name of Charge Nurse/RN Notified Bre RN  Date Charge Nurse/RN Notified 03/21/22  Time Charge Nurse/RN Notified 1901  Notify: Provider  Provider Name/Title Dr. Orpah Cobb NP  Date Provider Notified 03/21/22  Time Provider Notified 1901  Method of Notification Page  Notification Reason Other (Comment) (elevating HR)  Provider response See new orders  Date of Provider Response 03/21/22  Time of Provider Response 1909  Document  Patient Outcome Other (Comment) (Remains on the floor)  Progress note created (see row info) Yes  Assess: SIRS CRITERIA  SIRS Temperature  0  SIRS Pulse 1  SIRS Respirations  0  SIRS WBC 1  SIRS Score Sum  2

## 2022-03-22 ENCOUNTER — Encounter: Payer: Self-pay | Admitting: Family Medicine

## 2022-03-22 ENCOUNTER — Other Ambulatory Visit: Payer: Self-pay

## 2022-03-22 ENCOUNTER — Encounter
Admission: EM | Disposition: A | Discharge status: Home Health | Payer: Self-pay | Source: Home / Self Care | Attending: Internal Medicine

## 2022-03-22 ENCOUNTER — Inpatient Hospital Stay: Payer: Medicaid Other | Admitting: Registered Nurse

## 2022-03-22 ENCOUNTER — Inpatient Hospital Stay: Payer: Medicaid Other

## 2022-03-22 DIAGNOSIS — M8700 Idiopathic aseptic necrosis of unspecified bone: Secondary | ICD-10-CM | POA: Diagnosis present

## 2022-03-22 DIAGNOSIS — Z933 Colostomy status: Secondary | ICD-10-CM | POA: Diagnosis not present

## 2022-03-22 DIAGNOSIS — D509 Iron deficiency anemia, unspecified: Secondary | ICD-10-CM | POA: Diagnosis present

## 2022-03-22 DIAGNOSIS — M869 Osteomyelitis, unspecified: Secondary | ICD-10-CM | POA: Diagnosis not present

## 2022-03-22 HISTORY — PX: OPEN REDUCTION INTERNAL FIXATION (ORIF) TIBIA/FIBULA FRACTURE: SHX5992

## 2022-03-22 HISTORY — PX: BONE BIOPSY: SHX375

## 2022-03-22 LAB — BASIC METABOLIC PANEL
Anion gap: 6 (ref 5–15)
BUN: 10 mg/dL (ref 6–20)
CO2: 29 mmol/L (ref 22–32)
Calcium: 9.5 mg/dL (ref 8.9–10.3)
Chloride: 102 mmol/L (ref 98–111)
Creatinine, Ser: 0.65 mg/dL (ref 0.61–1.24)
GFR, Estimated: >60 mL/min (ref >60)
Glucose, Bld: 170 mg/dL — ABNORMAL HIGH (ref 70–99)
Potassium: 4.6 mmol/L (ref 3.5–5.1)
Sodium: 137 mmol/L (ref 135–145)

## 2022-03-22 LAB — FERRITIN: Ferritin: 215 ng/mL (ref 24–336)

## 2022-03-22 LAB — CBC
HCT: 40.9 % (ref 39.0–52.0)
Hemoglobin: 13 g/dL (ref 13.0–17.0)
MCH: 27.7 pg (ref 26.0–34.0)
MCHC: 31.8 g/dL (ref 30.0–36.0)
MCV: 87.2 fL (ref 80.0–100.0)
Platelets: 270 10*3/uL (ref 150–400)
RBC: 4.69 MIL/uL (ref 4.22–5.81)
RDW: 15.9 % — ABNORMAL HIGH (ref 11.5–15.5)
WBC: 8.7 10*3/uL (ref 4.0–10.5)
nRBC: 0 % (ref 0.0–0.2)

## 2022-03-22 LAB — IRON AND TIBC
Iron: 23 ug/dL — ABNORMAL LOW (ref 45–182)
Saturation Ratios: 7 % — ABNORMAL LOW (ref 17.9–39.5)
TIBC: 354 ug/dL (ref 250–450)
UIBC: 331 ug/dL

## 2022-03-22 LAB — MAGNESIUM: Magnesium: 2.2 mg/dL (ref 1.7–2.4)

## 2022-03-22 SURGERY — OPEN REDUCTION INTERNAL FIXATION (ORIF) TIBIA/FIBULA FRACTURE
Anesthesia: General | Site: Knee | Laterality: Right

## 2022-03-22 MED ORDER — CEFAZOLIN SODIUM-DEXTROSE 2-4 GM/100ML-% IV SOLN
2.0000 g | Freq: Three times a day (TID) | INTRAVENOUS | Status: DC
  Administered 2022-03-22 – 2022-03-26 (×12): 2 g via INTRAVENOUS
  Filled 2022-03-22 (×12): qty 100

## 2022-03-22 MED ORDER — PROPOFOL 10 MG/ML IV BOLUS
INTRAVENOUS | Status: DC | PRN
  Administered 2022-03-22: 30 mg via INTRAVENOUS
  Administered 2022-03-22 (×2): 20 mg via INTRAVENOUS
  Administered 2022-03-22: 100 mg via INTRAVENOUS
  Administered 2022-03-22: 20 mg via INTRAVENOUS

## 2022-03-22 MED ORDER — CHLORHEXIDINE GLUCONATE 0.12 % MT SOLN
15.0000 mL | Freq: Once | OROMUCOSAL | Status: AC
  Administered 2022-03-22: 15 mL via OROMUCOSAL

## 2022-03-22 MED ORDER — FERROUS SULFATE 325 (65 FE) MG PO TABS
325.0000 mg | ORAL_TABLET | ORAL | Status: DC
  Administered 2022-03-22 – 2022-03-26 (×3): 325 mg via ORAL
  Filled 2022-03-22 (×4): qty 1

## 2022-03-22 MED ORDER — DIPHENHYDRAMINE HCL 25 MG PO CAPS
25.0000 mg | ORAL_CAPSULE | Freq: Three times a day (TID) | ORAL | Status: DC | PRN
Start: 2022-03-22 — End: 2022-03-27
  Administered 2022-03-22: 25 mg via ORAL
  Filled 2022-03-22 (×2): qty 1

## 2022-03-22 MED ORDER — OXYCODONE HCL 5 MG PO TABS: 5.0000 mg | ORAL_TABLET | Freq: Once | ORAL | Status: AC | PRN

## 2022-03-22 MED ORDER — MIDAZOLAM HCL 2 MG/2ML IJ SOLN
INTRAMUSCULAR | Status: AC
  Filled 2022-03-22: qty 2

## 2022-03-22 MED ORDER — ONDANSETRON HCL 4 MG/2ML IJ SOLN
INTRAMUSCULAR | Status: DC | PRN
  Administered 2022-03-22: 4 mg via INTRAVENOUS

## 2022-03-22 MED ORDER — HYDROCODONE-ACETAMINOPHEN 5-325 MG PO TABS
1.0000 | ORAL_TABLET | ORAL | Status: DC | PRN
  Administered 2022-03-24: 2 via ORAL
  Filled 2022-03-22: qty 2

## 2022-03-22 MED ORDER — DROPERIDOL 2.5 MG/ML IJ SOLN
0.6250 mg | Freq: Once | INTRAMUSCULAR | Status: DC | PRN
Start: 2022-03-22 — End: 2022-03-22

## 2022-03-22 MED ORDER — OXYCODONE HCL 5 MG PO TABS: 5.0000 mg | ORAL_TABLET | ORAL | Status: DC | PRN

## 2022-03-22 MED ORDER — LIDOCAINE HCL (CARDIAC) PF 100 MG/5ML IV SOSY
PREFILLED_SYRINGE | INTRAVENOUS | Status: DC | PRN
  Administered 2022-03-22: 100 mg via INTRAVENOUS

## 2022-03-22 MED ORDER — METHOCARBAMOL 1000 MG/10ML IJ SOLN: 500.0000 mg | Freq: Four times a day (QID) | INTRAVENOUS | Status: DC | PRN

## 2022-03-22 MED ORDER — ACETAMINOPHEN 10 MG/ML IV SOLN: 1000.0000 mg | Freq: Once | INTRAVENOUS | Status: DC | PRN

## 2022-03-22 MED ORDER — MORPHINE SULFATE (PF) 2 MG/ML IV SOLN: 0.5000 mg | INTRAVENOUS | Status: DC | PRN

## 2022-03-22 MED ORDER — METHOCARBAMOL 500 MG PO TABS
500.0000 mg | ORAL_TABLET | Freq: Four times a day (QID) | ORAL | Status: DC | PRN
  Administered 2022-03-22 – 2022-03-26 (×3): 500 mg via ORAL
  Filled 2022-03-22 (×3): qty 1

## 2022-03-22 MED ORDER — METOPROLOL SUCCINATE ER 50 MG PO TB24
150.0000 mg | ORAL_TABLET | Freq: Every day | ORAL | Status: DC
  Administered 2022-03-22 – 2022-03-26 (×5): 150 mg via ORAL
  Filled 2022-03-22 (×5): qty 3

## 2022-03-22 MED ORDER — DEXAMETHASONE SODIUM PHOSPHATE 10 MG/ML IJ SOLN
INTRAMUSCULAR | Status: DC | PRN
  Administered 2022-03-22: 5 mg via INTRAVENOUS

## 2022-03-22 MED ORDER — HYDROMORPHONE HCL 1 MG/ML IJ SOLN
INTRAMUSCULAR | Status: DC | PRN
  Administered 2022-03-22: .5 mg via INTRAVENOUS

## 2022-03-22 MED ORDER — BISACODYL 5 MG PO TBEC: 5.0000 mg | DELAYED_RELEASE_TABLET | Freq: Every day | ORAL | Status: DC | PRN

## 2022-03-22 MED ORDER — RIVAROXABAN 20 MG PO TABS
20.0000 mg | ORAL_TABLET | Freq: Every evening | ORAL | Status: DC
Start: 2022-03-22 — End: 2022-03-27
  Administered 2022-03-22 – 2022-03-26 (×5): 20 mg via ORAL
  Filled 2022-03-22 (×5): qty 1

## 2022-03-22 MED ORDER — ZINC SULFATE 220 (50 ZN) MG PO CAPS
220.0000 mg | ORAL_CAPSULE | Freq: Every day | ORAL | Status: DC
  Administered 2022-03-22 – 2022-03-26 (×5): 220 mg via ORAL
  Filled 2022-03-22 (×5): qty 1

## 2022-03-22 MED ORDER — FENTANYL CITRATE (PF) 100 MCG/2ML IJ SOLN
INTRAMUSCULAR | Status: AC
  Filled 2022-03-22: qty 2

## 2022-03-22 MED ORDER — LACTATED RINGERS IV SOLN: INTRAVENOUS | Status: DC

## 2022-03-22 MED ORDER — MIDAZOLAM HCL 2 MG/2ML IJ SOLN
INTRAMUSCULAR | Status: DC | PRN
  Administered 2022-03-22: 2 mg via INTRAVENOUS

## 2022-03-22 MED ORDER — HYDROCODONE-ACETAMINOPHEN 7.5-325 MG PO TABS
1.0000 | ORAL_TABLET | ORAL | Status: DC | PRN
  Administered 2022-03-22 – 2022-03-26 (×12): 2 via ORAL
  Administered 2022-03-26: 1 via ORAL
  Administered 2022-03-26: 2 via ORAL
  Filled 2022-03-22 (×5): qty 2
  Filled 2022-03-22 (×2): qty 1
  Filled 2022-03-22 (×2): qty 2
  Filled 2022-03-22: qty 1
  Filled 2022-03-22 (×5): qty 2

## 2022-03-22 MED ORDER — FAMOTIDINE 20 MG PO TABS
20.0000 mg | ORAL_TABLET | Freq: Two times a day (BID) | ORAL | Status: DC
  Administered 2022-03-22 – 2022-03-26 (×9): 20 mg via ORAL
  Filled 2022-03-22 (×9): qty 1

## 2022-03-22 MED ORDER — HYDROXYCHLOROQUINE SULFATE 200 MG PO TABS
200.0000 mg | ORAL_TABLET | Freq: Two times a day (BID) | ORAL | Status: DC
  Administered 2022-03-22 – 2022-03-26 (×8): 200 mg via ORAL
  Filled 2022-03-22 (×8): qty 1

## 2022-03-22 MED ORDER — ACETAMINOPHEN 10 MG/ML IV SOLN
INTRAVENOUS | Status: DC | PRN
  Administered 2022-03-22: 1000 mg via INTRAVENOUS

## 2022-03-22 MED ORDER — OXYCODONE HCL 5 MG PO TABS
ORAL_TABLET | ORAL | Status: AC
  Administered 2022-03-22: 5 mg via ORAL
  Filled 2022-03-22: qty 1

## 2022-03-22 MED ORDER — OXYCODONE HCL 5 MG/5ML PO SOLN: 5.0000 mg | Freq: Once | ORAL | Status: AC | PRN

## 2022-03-22 MED ORDER — CHLORHEXIDINE GLUCONATE 0.12 % MT SOLN
OROMUCOSAL | Status: AC
  Filled 2022-03-22: qty 15

## 2022-03-22 MED ORDER — FENTANYL CITRATE (PF) 100 MCG/2ML IJ SOLN
INTRAMUSCULAR | Status: DC | PRN
  Administered 2022-03-22: 75 ug via INTRAVENOUS
  Administered 2022-03-22: 25 ug via INTRAVENOUS

## 2022-03-22 MED ORDER — KETAMINE HCL 10 MG/ML IJ SOLN
INTRAMUSCULAR | Status: DC | PRN
  Administered 2022-03-22: 20 mg via INTRAVENOUS
  Administered 2022-03-22: 30 mg via INTRAVENOUS

## 2022-03-22 MED ORDER — ONDANSETRON HCL 4 MG PO TABS: 4.0000 mg | ORAL_TABLET | Freq: Four times a day (QID) | ORAL | Status: DC | PRN

## 2022-03-22 MED ORDER — ALBUTEROL SULFATE (2.5 MG/3ML) 0.083% IN NEBU
2.5000 mg | INHALATION_SOLUTION | Freq: Three times a day (TID) | RESPIRATORY_TRACT | Status: DC | PRN
  Administered 2022-03-24 – 2022-03-25 (×2): 2.5 mg via RESPIRATORY_TRACT
  Filled 2022-03-22 (×2): qty 3

## 2022-03-22 MED ORDER — DULOXETINE HCL 30 MG PO CPEP
60.0000 mg | ORAL_CAPSULE | Freq: Every day | ORAL | Status: DC
  Administered 2022-03-23 – 2022-03-26 (×4): 60 mg via ORAL
  Filled 2022-03-22 (×4): qty 2

## 2022-03-22 MED ORDER — METOCLOPRAMIDE HCL 5 MG PO TABS: 5.0000 mg | ORAL_TABLET | Freq: Three times a day (TID) | ORAL | Status: DC | PRN

## 2022-03-22 MED ORDER — DOCUSATE SODIUM 100 MG PO CAPS
100.0000 mg | ORAL_CAPSULE | Freq: Two times a day (BID) | ORAL | Status: DC
  Administered 2022-03-22 – 2022-03-26 (×5): 100 mg via ORAL
  Filled 2022-03-22 (×9): qty 1

## 2022-03-22 MED ORDER — 0.9 % SODIUM CHLORIDE (POUR BTL) OPTIME
TOPICAL | Status: DC | PRN
  Administered 2022-03-22: 1000 mL

## 2022-03-22 MED ORDER — HYDROMORPHONE HCL 1 MG/ML IJ SOLN
INTRAMUSCULAR | Status: AC
  Filled 2022-03-22: qty 1

## 2022-03-22 MED ORDER — ACETAMINOPHEN 325 MG PO TABS: 325.0000 mg | ORAL_TABLET | Freq: Four times a day (QID) | ORAL | Status: DC | PRN

## 2022-03-22 MED ORDER — SODIUM CHLORIDE 0.9 % IV SOLN
200.0000 mg | Freq: Every day | INTRAVENOUS | Status: DC
  Filled 2022-03-22: qty 10

## 2022-03-22 MED ORDER — SENNOSIDES-DOCUSATE SODIUM 8.6-50 MG PO TABS: 1.0000 | ORAL_TABLET | Freq: Every evening | ORAL | Status: DC | PRN

## 2022-03-22 MED ORDER — METOCLOPRAMIDE HCL 5 MG/ML IJ SOLN: 5.0000 mg | Freq: Three times a day (TID) | INTRAMUSCULAR | Status: DC | PRN

## 2022-03-22 MED ORDER — NYSTATIN 100000 UNIT/GM EX POWD: Freq: Three times a day (TID) | CUTANEOUS | Status: DC | PRN

## 2022-03-22 MED ORDER — DEXMEDETOMIDINE HCL IN NACL 200 MCG/50ML IV SOLN
INTRAVENOUS | Status: DC | PRN
  Administered 2022-03-22: 8 ug via INTRAVENOUS
  Administered 2022-03-22: 12 ug via INTRAVENOUS

## 2022-03-22 MED ORDER — VITAMIN B-12 1000 MCG PO TABS
1000.0000 ug | ORAL_TABLET | Freq: Every day | ORAL | Status: DC
  Administered 2022-03-23 – 2022-03-26 (×4): 1000 ug via ORAL
  Filled 2022-03-22 (×4): qty 1

## 2022-03-22 MED ORDER — ONDANSETRON HCL 4 MG/2ML IJ SOLN: 4.0000 mg | Freq: Four times a day (QID) | INTRAMUSCULAR | Status: DC | PRN

## 2022-03-22 MED ORDER — PHENYLEPHRINE 80 MCG/ML (10ML) SYRINGE FOR IV PUSH (FOR BLOOD PRESSURE SUPPORT)
PREFILLED_SYRINGE | INTRAVENOUS | Status: DC | PRN
  Administered 2022-03-22: 80 ug via INTRAVENOUS

## 2022-03-22 MED ORDER — KETAMINE HCL 50 MG/5ML IJ SOSY
PREFILLED_SYRINGE | INTRAMUSCULAR | Status: AC
  Filled 2022-03-22: qty 5

## 2022-03-22 MED ORDER — FLEET ENEMA 7-19 GM/118ML RE ENEM
1.0000 | ENEMA | Freq: Once | RECTAL | Status: DC | PRN
Start: 2022-03-22 — End: 2022-03-27

## 2022-03-22 SURGICAL SUPPLY — 58 items
BLADE CLIPPER SURG (BLADE) ×1 IMPLANT
BLADE INCISOR PLUS 4.5 (BLADE) IMPLANT
BNDG ELASTIC 4X5.8 VLCR STR LF (GAUZE/BANDAGES/DRESSINGS) IMPLANT
CHLORAPREP W/TINT 26 (MISCELLANEOUS) ×3 IMPLANT
CUFF TOURN SGL QUICK 24 (TOURNIQUET CUFF)
CUFF TOURN SGL QUICK 34 (TOURNIQUET CUFF)
CUFF TRNQT CYL 24X4X16.5-23 (TOURNIQUET CUFF) IMPLANT
CUFF TRNQT CYL 34X4.125X (TOURNIQUET CUFF) IMPLANT
DRAPE 3/4 80X56 (DRAPES) ×3 IMPLANT
DRAPE ARTHRO LIMB 89X125 STRL (DRAPES) ×2 IMPLANT
DRAPE C-ARM XRAY 36X54 (DRAPES) ×3 IMPLANT
DRAPE C-ARMOR (DRAPES) ×3 IMPLANT
DRSG EMULSION OIL 3X8 NADH (GAUZE/BANDAGES/DRESSINGS) ×2 IMPLANT
ELECT CAUTERY BLADE 6.4 (BLADE) ×2 IMPLANT
ELECT REM PT RETURN 9FT ADLT (ELECTROSURGICAL) ×3
ELECTRODE REM PT RTRN 9FT ADLT (ELECTROSURGICAL) ×2 IMPLANT
GAUZE SPONGE 4X4 12PLY STRL (GAUZE/BANDAGES/DRESSINGS) ×2 IMPLANT
GLOVE SURG SYN 9.0  PF PI (GLOVE) ×1
GLOVE SURG SYN 9.0 PF PI (GLOVE) ×2 IMPLANT
GOWN SRG 2XL LVL 4 RGLN SLV (GOWNS) ×2 IMPLANT
GOWN STRL NON-REIN 2XL LVL4 (GOWNS) ×1
GOWN STRL REUS W/ TWL LRG LVL3 (GOWN DISPOSABLE) ×4 IMPLANT
GOWN STRL REUS W/TWL LRG LVL3 (GOWN DISPOSABLE) ×1
HEMOVAC 400CC 10FR (MISCELLANEOUS) ×2 IMPLANT
IV LACTATED RINGER IRRG 3000ML (IV SOLUTION)
IV LR IRRIG 3000ML ARTHROMATIC (IV SOLUTION) ×4 IMPLANT
KIT TURNOVER KIT A (KITS) ×3 IMPLANT
MANIFOLD NEPTUNE II (INSTRUMENTS) ×5 IMPLANT
NDL BIOPSY JAMSHIDI 11X6 (NEEDLE) IMPLANT
NDL FILTER BLUNT 18X1 1/2 (NEEDLE) ×2 IMPLANT
NEEDLE BIOPSY JAMSHIDI 11X6 (NEEDLE) ×9 IMPLANT
NEEDLE FILTER BLUNT 18X 1/2SAF (NEEDLE)
NEEDLE FILTER BLUNT 18X1 1/2 (NEEDLE) IMPLANT
NEEDLE HYPO 22GX1.5 SAFETY (NEEDLE) ×2 IMPLANT
NS IRRIG 1000ML POUR BTL (IV SOLUTION) ×3 IMPLANT
PACK ARTHROSCOPY KNEE (MISCELLANEOUS) ×2 IMPLANT
PACK TOTAL KNEE (MISCELLANEOUS) ×3 IMPLANT
PAD PREP 24X41 OB/GYN DISP (PERSONAL CARE ITEMS) ×3 IMPLANT
SCALPEL PROTECTED #10 DISP (BLADE) ×6 IMPLANT
SCALPEL PROTECTED #11 DISP (BLADE) ×2 IMPLANT
SPONGE T-LAP 18X18 ~~LOC~~+RFID (SPONGE) ×2 IMPLANT
STAPLER SKIN PROX 35W (STAPLE) ×3 IMPLANT
STRAP SAFETY 5IN WIDE (MISCELLANEOUS) ×2 IMPLANT
SUT ETHILON 4-0 (SUTURE)
SUT ETHILON 4-0 FS2 18XMFL BLK (SUTURE)
SUT VIC AB 2-0 SH 27 (SUTURE) ×1
SUT VIC AB 2-0 SH 27XBRD (SUTURE) ×2 IMPLANT
SUT VICRYL 1-0 27IN ABS (SUTURE) ×3
SUTURE ETHLN 4-0 FS2 18XMF BLK (SUTURE) ×2 IMPLANT
SUTURE VICRYL 1-0 27IN ABS (SUTURE) ×2 IMPLANT
SWAB CULTURE AMIES ANAERIB BLU (MISCELLANEOUS) ×4 IMPLANT
SYR 20ML LL LF (SYRINGE) ×3 IMPLANT
SYR 5ML LL (SYRINGE) ×2 IMPLANT
TRAY FOLEY MTR SLVR 16FR STAT (SET/KITS/TRAYS/PACK) ×2 IMPLANT
TUBING INFLOW SET DBFLO PUMP (TUBING) ×2 IMPLANT
TUBING OUTFLOW SET DBLFO PUMP (TUBING) ×2 IMPLANT
WAND COBLATION FLOW 50 (SURGICAL WAND) IMPLANT
WATER STERILE IRR 500ML POUR (IV SOLUTION) ×3 IMPLANT

## 2022-03-22 NOTE — Addendum Note (Signed)
Addendum  created 03/22/22 1652 by Iran Ouch, MD   Clinical Note Signed

## 2022-03-22 NOTE — Transfer of Care (Signed)
Immediate Anesthesia Transfer of Care Note  Patient: CLEOPHAS YOAK  Procedure(s) Performed: OPEN REDUCTION INTERNAL FIXATION (ORIF) TIBIA/FIBULA FRACTURE-BONE BIOPSY (Right) BONE BIOPSY OF TIBIA  FIBULA (Right: Knee)  Patient Location: PACU  Anesthesia Type:General  Level of Consciousness: drowsy and patient cooperative  Airway & Oxygen Therapy: Patient Spontanous Breathing and Patient connected to face mask oxygen  Post-op Assessment: Report given to RN and Post -op Vital signs reviewed and stable  Post vital signs: Reviewed and stable  Last Vitals:  Vitals Value Taken Time  BP 130/68 03/22/22 1115  Temp    Pulse 114 03/22/22 1115  Resp 10 03/22/22 1115  SpO2 97 % 03/22/22 1115  Vitals shown include unvalidated device data.  Last Pain:  Vitals:   03/22/22 0928  TempSrc: Temporal  PainSc: 4       Patients Stated Pain Goal: 5 (35/36/14 4315)  Complications: No notable events documented.

## 2022-03-22 NOTE — Op Note (Signed)
03/22/2022  11:06 AM  PATIENT:  Craig [name]  31 y.o. 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 right leg  POST-OPERATIVE DIAGNOSIS:  RIGHT KNEE OSTEOMYELITIS VS AVASCULAR NECROSIS  PROCEDURE: Biopsy of tibia and femur with cultures  SURGEON: Laurene Footman, MD  ASSISTANTS: None  ANESTHESIA:   general  EBL:  Total I/O In: 400 [I.V.:400] Out: -   BLOOD ADMINISTERED:none  DRAINS: none   LOCAL MEDICATIONS USED:  NONE  SPECIMEN:  Source of Specimen:    Jamshidi needle used for biopsies of proximal tibia and distal femur with bone also sent for culture  DISPOSITION OF SPECIMEN:  PATHOLOGY and microbiology  COUNTS:  YES  TOURNIQUET:  * No tourniquets in log *  IMPLANTS: None  DICTATION: .Dragon Dictation patient was brought to the operating room and after adequate anesthesia was obtained the right leg was prepped and draped in the usual sterile fashion.  After patient identification and timeout procedures C arm was brought in 18-gauge needle was inserted into the knee and no joint fluid could be withdrawn except for tiny amount of clear fluid there did not appear to be a septic knee present.  With the C arm utilized to medial incisions were made first over the distal femur and Jamshidi needle used to get multiple bone biopsies as well as bone cultures.  This was done through several passes through the medial femoral condyle up into the metaphysis where MRI had shown AVN.  Identical procedure carried out on the tibial side with a new Jamshidi needle to prevent a cross-contamination.  After multiple passes were obtained the specimen was sent and the wound was left open to drain the wounds were covered with Xeroform followed by 4 x 4's Webril and an Ace wrap.  PLAN OF CARE: Admit to inpatient   PATIENT DISPOSITION:  PACU - hemodynamically stable.

## 2022-03-22 NOTE — TOC Progression Note (Signed)
Transition of Care [facility]) - Progression Note    Patient Details  Name: Craig [name] MRN: [id number] Date of Birth: March 27, 1991  Transition of Care [facility] CM/SW [place], RN Phone Number: 03/22/2022, 8:15 AM  Clinical Narrative:     Patient is scheduled to have a bone biopsy today, TOC following for needs       Expected Discharge Plan and Services                                                 Social Determinants of Health (SDOH) Interventions    Readmission Risk Interventions    08/30/2021    2:43 PM 07/10/2021    4:28 PM  Readmission Risk Prevention Plan  Transportation Screening Complete Complete  PCP or Specialist Appt within 3-5 Days  Complete  HRI or [place]  Complete  Social Work Consult for [place] Planning/Counseling  Complete  Palliative Care Screening  Not Applicable  Medication Review Press photographer) Complete Complete  PCP or Specialist appointment within 3-5 days of discharge Complete   HRI or [place] Complete   SW Recovery Care/Counseling Consult Complete   [place] Not Applicable

## 2022-03-22 NOTE — Plan of Care (Signed)

## 2022-03-22 NOTE — Progress Notes (Addendum)
Progress Note    Craig [name]  LGX:[id number] DOB: [birth year]  DOA: 03/21/2022 PCP: [facility]      Brief Narrative:    Medical records reviewed and are as summarized below:  Craig [name] is a 31 y.o. male with medical history significant for morbid obesity (BMI 35.95), obstructive sleep apnea on CPAP, history of inflammatory arthritis on methotrexate and prednisone, ARDS from COVID-19 infection requiring tracheostomy subsequently decannulated, upper extremity DVT on Xarelto, history of perforated diverticulitis status post colonic resection and ostomy creation, history of right knee septic arthritis (MSSA) status post washout, status post IV antibiotics for MSSA septic arthritis of over 3 months.  He presented to the hospital because of right knee pain, redness and swelling.  He had an MRI of the right knee on 03/09/2022 prior to admission and report is as follows:  1. Extensive bone infarct involving the proximal tibial metaphysis extending to the articular surface both medially and laterally and into the diaphysis. Small amount of fluid tibial infarct which communicates with the acute particular surface of the lateral tibial plateau. Mild heterogeneous edema within the tibial bone infarct. An element of underlying chronic osteomyelitis cannot be excluded. 2. Extensive bone infarct involving the distal femoral metaphysis extending to the epiphysis. Small bone infarct in the proximal fibula and patella. 3. Extensive tricompartmental full-thickness cartilage loss consistent with advanced osteoarthritis. 4. Maceration of the medial and lateral menisci.   He was admitted to the hospital for avascular necrosis versus possible osteomyelitis of the right knee.  He was treated with analgesics.  He underwent biopsy of the tibia and femur with cultures.     Assessment/Plan:   Principal Problem:   Osteomyelitis of right knee region [facility]) Active  Problems:   Obesity (BMI 30-39.9)   Hypertension   Gout   Colostomy status (HCC)   Nicotine dependence   DVT (deep venous thrombosis) (HCC)   Avascular necrosis of bone (HCC), right knee   Iron deficiency anemia   Body mass index is 36.94 kg/m.  (Obesity)  Right knee (proximal tibia and distal femur) avascular necrosis versus possible osteomyelitis: S/p biopsy of right tibia and femur with cultures on 03/22/2022.  Analgesics as needed for pain.  Follow-up with orthopedic surgery for further recommendations.  Iron deficiency anemia: Start ferrous sulfate.   Latest Reference Range & Units Most Recent  Iron 45 - 182 ug/dL 23 (L) 03/22/22 02:09  UIBC ug/dL 331 03/22/22 02:09  TIBC 250 - 450 ug/dL 354 03/22/22 02:09  Saturation Ratios 17.9 - 39.5 % 7 (L) 03/22/22 02:09  Ferritin 24 - 336 ng/mL 215 03/22/22 02:09  Folate >5.9 ng/mL 34.0 09/01/21 07:40  (L): Data is abnormally low  Hypertension: Continue metoprolol  Gout: Continue colchicine and allopurinol  Intermittent jerky movement of right leg when he is sleeping: Unknown if he has restless leg syndrome.  He has been started on ferrous sulfate for iron deficiency anemia.  Other comorbidities include history of DVT on Xarelto, tobacco use disorder, colostomy status,  Diet Order             Diet Carb Modified Fluid consistency: Thin; Room service appropriate? Yes  Diet effective now                            Consultants: Orthopedic surgeon  Procedures: S/p biopsy of right tibia and femur with cultures on 03/22/2022.    Medications:  allopurinol  400 mg Oral Daily   ascorbic acid  1,000 mg Oral Daily   budesonide  0.25 mg Nebulization BID   cholecalciferol  1,000 Units Oral Daily   colchicine  0.6 mg Oral Daily   cyanocobalamin  1,000 mcg Oral Daily   docusate sodium  100 mg Oral BID   DULoxetine  60 mg Oral Daily   famotidine  20 mg Oral BID   folic acid  1 mg Oral Daily   hydroxychloroquine  200 mg  Oral BID   loratadine  10 mg Oral Daily   magnesium oxide  400 mg Oral BID   metoprolol succinate  150 mg Oral Daily   multivitamin with minerals  1 tablet Oral Daily   polyethylene glycol  17 g Oral Daily   pregabalin  200 mg Oral BID   rivaroxaban  20 mg Oral QPM   zinc sulfate  220 mg Oral Daily   Continuous Infusions:  0.9 % NaCl with KCl 20 mEq / L Stopped (03/22/22 0912)    ceFAZolin (ANCEF) IV 2 g (03/22/22 1321)   iron sucrose     lactated ringers 10 mL/hr at 03/22/22 1008   methocarbamol (ROBAXIN) IV       Anti-infectives (From admission, onward)    Start     Dose/Rate Route Frequency Ordered Stop   03/22/22 1400  ceFAZolin (ANCEF) IVPB 2g/100 mL premix        2 g 200 mL/hr over 30 Minutes Intravenous Every 8 hours 03/22/22 1238     03/22/22 1000  hydroxychloroquine (PLAQUENIL) tablet 200 mg        200 mg Oral 2 times daily 03/22/22 0806                Family Communication/Anticipated D/C date and plan/Code Status   DVT prophylaxis: SCDs Start: 03/22/22 1238 SCDs Start: 03/21/22 0538 rivaroxaban (XARELTO) tablet 20 mg     Code Status: Full Code  Family Communication: None Disposition Plan: To be determined   Status is: Inpatient Remains inpatient appropriate because: Awaiting bone biopsy and culture results       Subjective:   Interval events noted.  He complains of pain in the right knee.  He also complains of intermittent jerking movement of the right leg that usually when he is sleeping.  His son was at the bedside.  Objective:    Vitals:   03/22/22 1145 03/22/22 1200 03/22/22 1215 03/22/22 1241  BP: (!) 146/70 135/66 135/67 129/76  Pulse: (!) 107 (!) 109 (!) 107 (!) 107  Resp: (!) '23 20 20 16  '$ Temp:   (!) 97.3 F (36.3 C) 98.8 F (37.1 C)  TempSrc:    Oral  SpO2: 94% 95% 94% 93%  Weight:      Height:       No data found.   Intake/Output Summary (Last 24 hours) at 03/22/2022 1420 Last data filed at 03/22/2022 1116 Gross per 24  hour  Intake 1704.11 ml  Output 2170 ml  Net -465.89 ml   Filed Weights   03/21/22 0032 03/22/22 0928  Weight: 127 kg 127 kg    Exam:  GEN: NAD SKIN: Warm and dry EYES: No pallor or icterus ENT: MMM CV: RRR PULM: CTA B, ABD: soft, obese, NT, +BS, + colostomy in left lower quadrant CNS: AAO x 3, non focal EXT: Right knee swelling, tenderness and erythema       Data Reviewed:   I have personally reviewed following labs and  imaging studies:  Labs: Labs show the following:   Basic Metabolic Panel: Recent Labs  Lab 03/21/22 0034 03/22/22 0209  NA 136 137  K 3.5 4.6  CL 95* 102  CO2 27 29  GLUCOSE 143* 170*  BUN 6 10  CREATININE 0.65 0.65  CALCIUM 9.4 9.5  MG  --  2.2   GFR Estimated Creatinine Clearance: 183.3 mL/min (by C-G formula based on SCr of 0.65 mg/dL). Liver Function Tests: No results for input(s): "AST", "ALT", "ALKPHOS", "BILITOT", "PROT", "ALBUMIN" in the last 168 hours. No results for input(s): "LIPASE", "AMYLASE" in the last 168 hours. No results for input(s): "AMMONIA" in the last 168 hours. Coagulation profile Recent Labs  Lab 03/21/22 0515  INR 1.1    CBC: Recent Labs  Lab 03/21/22 0034 03/22/22 0209  WBC 9.2 8.7  NEUTROABS 6.1  --   HGB 13.5 13.0  HCT 41.7 40.9  MCV 87.1 87.2  PLT 246 270   Cardiac Enzymes: No results for input(s): "CKTOTAL", "CKMB", "CKMBINDEX", "TROPONINI" in the last 168 hours. BNP (last 3 results) No results for input(s): "PROBNP" in the last 8760 hours. CBG: Recent Labs  Lab 03/21/22 1708 03/21/22 2121  GLUCAP 155* 159*   D-Dimer: No results for input(s): "DDIMER" in the last 72 hours. Hgb A1c: No results for input(s): "HGBA1C" in the last 72 hours. Lipid Profile: No results for input(s): "CHOL", "HDL", "LDLCALC", "TRIG", "CHOLHDL", "LDLDIRECT" in the last 72 hours. Thyroid function studies: No results for input(s): "TSH", "T4TOTAL", "T3FREE", "THYROIDAB" in the last 72 hours.  Invalid  input(s): "FREET3" Anemia work up: Recent Labs    03/22/22 0209  FERRITIN 215  TIBC 354  IRON 23*   Sepsis Labs: Recent Labs  Lab 03/21/22 0034 03/21/22 0515 03/21/22 1719 03/22/22 0209  WBC 9.2  --   --  8.7  LATICACIDVEN  --  2.7* 2.1*  --     Microbiology Recent Results (from the past 240 hour(s))  Blood Culture (routine x 2)     Status: None (Preliminary result)   Collection Time: 03/21/22  5:15 AM   Specimen: BLOOD  Result Value Ref Range Status   Specimen Description BLOOD LEFT FA  Final   Special Requests   Final    BOTTLES DRAWN AEROBIC AND ANAEROBIC Blood Culture results may not be optimal due to an inadequate volume of blood received in culture bottles   Culture   Final    NO GROWTH 1 DAY Performed at [facility], [address], [place], [place] [address]    Report Status PENDING  Incomplete  Blood Culture (routine x 2)     Status: None (Preliminary result)   Collection Time: 03/21/22  5:15 AM   Specimen: BLOOD  Result Value Ref Range Status   Specimen Description BLOOD RIGHT AC  Final   Special Requests   Final    BOTTLES DRAWN AEROBIC AND ANAEROBIC Blood Culture results may not be optimal due to an inadequate volume of blood received in culture bottles   Culture   Final    NO GROWTH 1 DAY Performed at [facility], [address] [place], [place] [address]    Report Status PENDING  Incomplete    Procedures and diagnostic studies:  DG Knee 1-2 Views Right  Result Date: 03/22/2022 CLINICAL DATA:  Bone biopsy EXAM: RIGHT KNEE - 1 VIEW COMPARISON:  Radiograph dated March 21, 2022 FINDINGS: Fluoroscopic images were obtained intraoperatively and submitted for post operative interpretation. Single image  demonstrates bone biopsy with 34 seconds of fluoroscopy time and 2.24 mGy. Please see the performing provider's procedural report for further detail. IMPRESSION: Fluoroscopic images demonstrate bone biopsy. Electronically Signed   By: Yetta Glassman M.D.   On: 03/22/2022 11:02   DG C-Arm 1-60 Min-No Report  Result Date: 03/22/2022 Fluoroscopy was utilized by the requesting physician.  No radiographic interpretation.   DG Knee Complete 4 Views Right  Result Date: 03/21/2022 CLINICAL DATA:  Right knee pain. EXAM: RIGHT KNEE - COMPLETE 4+ VIEW COMPARISON:  Right knee radiograph dated 08/28/2021. FINDINGS: There is no acute fracture or dislocation. There is advanced osteopenia. Severe erosive changes of the knee, significantly progressed since the prior radiograph and likely related to chronic osteomyelitis. Correlation with clinical exam is recommended to evaluate for possibility of acute septic joint. Sclerotic changes of the knee in keeping with bone infarcts. No suprapatellar effusion. The soft tissues are grossly unremarkable. IMPRESSION: 1. No acute fracture or dislocation. 2. Severe erosive changes of the knee, significantly progressed since the prior radiograph and likely related to chronic osteomyelitis. Clinical correlation is recommended to evaluate for septic joint. Electronically Signed   By: Anner Crete M.D.   On: 03/21/2022 01:19               LOS: 1 day   Elliotte Marsalis  [facility]   Pager on [email or web address]. If 7PM-7AM, please contact night-coverage at [email or web address]     03/22/2022, 2:20 PM

## 2022-03-22 NOTE — Anesthesia Preprocedure Evaluation (Addendum)
Anesthesia Evaluation  Patient identified by MRN, date of birth, ID band Patient awake    Reviewed: Allergy & Precautions, H&P , NPO status , Patient's Chart, lab work & pertinent test results, reviewed documented beta blocker date and time   Airway Mallampati: III  TM Distance: >3 FB Neck ROM: full    Dental  (+) Teeth Intact   Pulmonary sleep apnea and Continuous Positive Airway Pressure Ventilation ,  -ARDS from COVID-19 infection requiring tracheostomy subsequently decannulated. 70-monthintubation and long COVID symptoms afterwards   Pulmonary exam normal        Cardiovascular Exercise Tolerance: Poor hypertension, On Medications  Rhythm:regular Rate:Tachycardia     Neuro/Psych  Neuromuscular disease (neuropathy in left hand and feet) negative psych ROS   GI/Hepatic Neg liver ROS, Colostomy status    Endo/Other  diabetes, Well ControlledMorbid obesity  Renal/GU negative Renal ROS  negative genitourinary   Musculoskeletal  (+) Arthritis ,   Abdominal (+) + obese,   Peds  Hematology negative hematology ROS (+)   Anesthesia Other Findings Osteomyelitis of right knee region -history of inflammatory arthritis on methotrexate and prednisone  - upper extremity DVT on Xarelto - history of perforated diverticulitis status post colonic resection and ostomy creation - history of right knee septic arthritis (MSSA) status post washout -status post IV antibiotics for MSSA septic arthritis of over 3 months who presents to the emergency room by EMS for evaluation of worsening right knee pain and swelling.    Past Medical History: No date: Gout No date: Hypertension No date: Rupture of bowel ([facility] Past Surgical History: 05/18/2020: COLONOSCOPY WITH PROPOFOL; N/A     Comment:  Procedure: COLONOSCOPY WITH PROPOFOL;  Surgeon: SBenjamine Sprague DO;  Location: [facility] ENDOSCOPY;  Service: General;                Laterality: N/A; 11/22/2019: COLOSTOMY; N/A     Comment:  Procedure: COLOSTOMY;  Surgeon: CHerbert Pun               MD;  Location: [facility] ORS;  Service: General;  Laterality:               N/A; 07/11/2021: KNEE ARTHROSCOPY; Right     Comment:  Procedure: SYNOVIAL BIOPSY AND ARTHROSCOPIC IRRGATION               AND DEBRIDEMENT OF SEPTIC KNEE;  Surgeon: MHessie Knows               MD;  Location: [facility] ORS;  Service: Orthopedics;                Laterality: Right; 07/18/2021: KNEE ARTHROSCOPY; Right     Comment:  Procedure: ARTHROSCOPY KNEE, IRRIGATION AND DEBRIDEMENT;              Surgeon: MHessie Knows MD;  Location: [facility] ORS;                Service: Orthopedics;  Laterality: Right; 11/22/2019: LAPAROTOMY; N/A     Comment:  Procedure: EXPLORATORY LAPAROTOMY;  Surgeon:               CHerbert Pun MD;  Location: [facility] ORS;  Service:              General;  Laterality: N/A; 07/08/2020: LAPAROTOMY; N/A     Comment:  Procedure: EXPLORATORY LAPAROTOMY;  Surgeon:  Herbert Pun, MD;  Location: [facility];  Service:              General;  Laterality: N/A; 11/22/2019: PARTIAL COLECTOMY; N/A     Comment:  Procedure: PARTIAL COLECTOMY;  Surgeon: Herbert Pun, MD;  Location: [facility] ORS;  Service: General;                Laterality: N/A; 09/14/2020: PEG PLACEMENT; N/A     Comment:  Procedure: PERCUTANEOUS ENDOSCOPIC GASTROSTOMY (PEG)               PLACEMENT;  Surgeon: Benjamine Sprague, DO;  Location: [facility]               ENDOSCOPY;  Service: General;  Laterality: N/A;  TRAVEL               CASE [place] 07/11/2021: SYNOVIAL BIOPSY; Right     Comment:  Procedure: SYNOVIAL BIOPSY;  Surgeon: Hessie Knows, MD;              Location: [facility] ORS;  Service: Orthopedics;  Laterality:               Right; 09/13/2020: TRACHEOSTOMY TUBE PLACEMENT; N/A     Comment:  Procedure: TRACHEOSTOMY;  Surgeon: Beverly Gust, MD;              Location: [facility]  ORS;  Service: ENT;  Laterality: N/A; 06/28/2020: XI ROBOTIC ASSISTED COLOSTOMY TAKEDOWN; N/A     Comment:  Procedure: XI ROBOTIC ASSISTED COLOSTOMY TAKEDOWN               CONVERTED TO OPEN PROCEDURE;  Surgeon: Herbert Pun, MD;  Location: [facility] ORS;  Service: General;                Laterality: N/A; BMI    Body Mass Index: 39.01 kg/m     Reproductive/Obstetrics negative OB ROS                           Anesthesia Physical  Anesthesia Plan  ASA: 3  Anesthesia Plan: General   Post-op Pain Management: Toradol IV (intra-op)* and Ofirmev IV (intra-op)*   Induction:   PONV Risk Score and Plan: 3 and Ondansetron and Dexamethasone  Airway Management Planned: LMA  Additional Equipment:   Intra-op Plan:   Post-operative Plan: Extubation in OR  Informed Consent: I have reviewed the patients History and Physical, chart, labs and discussed the procedure including the risks, benefits and alternatives for the proposed anesthesia with the patient or authorized representative who has indicated his/her understanding and acceptance.     Dental Advisory Given  Plan Discussed with: CRNA  Anesthesia Plan Comments:        Anesthesia Quick Evaluation

## 2022-03-22 NOTE — Anesthesia Postprocedure Evaluation (Addendum)
Anesthesia Post Note  Patient: Craig [name]  Procedure(s) Performed: OPEN REDUCTION INTERNAL FIXATION (ORIF) TIBIA/FIBULA FRACTURE-BONE BIOPSY (Right) BONE BIOPSY OF TIBIA  FIBULA (Right: Knee)  Patient location during evaluation: PACU Anesthesia Type: General Level of consciousness: awake and alert Pain management: pain level controlled Vital Signs Assessment: post-procedure vital signs reviewed and stable Respiratory status: spontaneous breathing, nonlabored ventilation, respiratory function stable and patient connected to nasal cannula oxygen Cardiovascular status: blood pressure returned to baseline and stable Postop Assessment: no apparent nausea or vomiting Anesthetic complications: no   No notable events documented.   Last Vitals:  Vitals:   03/22/22 1215 03/22/22 1241  BP: 135/67 129/76  Pulse: (!) 107 (!) 107  Resp: 20 16  Temp: (!) 36.3 C 37.1 C  SpO2: 94% 93%    Last Pain:  Vitals:   03/22/22 1424  TempSrc:   PainSc: [place]

## 2022-03-23 ENCOUNTER — Encounter: Payer: Self-pay | Admitting: Orthopedic Surgery

## 2022-03-23 DIAGNOSIS — D509 Iron deficiency anemia, unspecified: Secondary | ICD-10-CM

## 2022-03-23 DIAGNOSIS — Z933 Colostomy status: Secondary | ICD-10-CM | POA: Diagnosis not present

## 2022-03-23 DIAGNOSIS — M8700 Idiopathic aseptic necrosis of unspecified bone: Secondary | ICD-10-CM | POA: Diagnosis not present

## 2022-03-23 DIAGNOSIS — M869 Osteomyelitis, unspecified: Secondary | ICD-10-CM | POA: Diagnosis not present

## 2022-03-23 LAB — SEDIMENTATION RATE: Sed Rate: 108 mm/hr — ABNORMAL HIGH (ref 0–15)

## 2022-03-23 LAB — C-REACTIVE PROTEIN: CRP: 15.7 mg/dL — ABNORMAL HIGH (ref <1.0)

## 2022-03-23 LAB — SURGICAL PATHOLOGY

## 2022-03-23 NOTE — Progress Notes (Signed)
Bone biopsy initial results is showing some gram-positive cocci on the tibial side.  Need to check with Dr. [name] regarding continued antibiotics.  And it notes it is showing that the patient had an open reduction and fixation of that that is incorrect was only bone biopsies of the tibia and distal femur.

## 2022-03-23 NOTE — Progress Notes (Signed)
Occupational Therapy Evaluation Patient Details Name: Craig [name] MRN: [id number] DOB: 05-24-1991 Today's Date: 03/23/2022   History of Present Illness Craig [name] is a 31yoM who comes to [facility] on 03/21/22 after progression of Rt knee pain and swelling- history of recurrent infection in joint with multiple admissions. Pain/swelling progressing since 7/21. MRI revealing of osteo in Rt distal femur and proximal tibia. Pt went to OR with Dr. Rudene Christians for biopsy. PMH: inflammatory arthritis, DVT on xarelto, COVID19 infection c prolonged critical illness Dec '21-Feb '22, post covid tachycardia, CRF on 2L O2, bactremia, PNA, ostomy, gout. Recent hospitalization required intubatation, seizures, quadriparesis.   Clinical Impression   Patient seen for OT evaluation this date. Patient presenting with decreased functional mobility, strength, ROM, and pain impacting safety and independence in ADLs. PTA patient reports receiving assistance from family for ADLs (wife helps with bathing, dressing, etc.) and IADLs (household management tasks, driving). Patient reports using hoyer lift recently to transfer to/from bed, chair, and w/c. Patient reports improvements in ROM and strengthening since last admission and is active in UE home program using theraband and 25# dumbbells. Patient is on 6L O2 (trilogy machine) at night and wears during home exercise. Patient will benefit from acute OT to increase overall independence in the areas of ADLs in order to safely discharge home, however, no continued services are indicated upon discharge at this time. OT services may be indicated in future pending procedures to address R knee infection/pain.     Recommendations for follow up therapy are one component of a multi-disciplinary discharge planning process, led by the attending physician.  Recommendations may be updated based on patient status, additional functional criteria and insurance authorization.   Follow Up  Recommendations  No OT follow up (OT services may be indicated in future pending procedures to address R knee infection/pain.)    Assistance Recommended at Discharge Frequent or constant Supervision/Assistance  Patient can return home with the following Assistance with cooking/housework;Assistance with feeding;A lot of help with bathing/dressing/bathroom;Assist for transportation    Functional Status Assessment  Patient has had a recent decline in their functional status and demonstrates the ability to make significant improvements in function in a reasonable and predictable amount of time.  Equipment Recommendations  None recommended by OT    Recommendations for Other Services       Precautions / Restrictions Precautions Precautions: Fall Restrictions Weight Bearing Restrictions: No      Mobility Bed Mobility Overal bed mobility: Needs Assistance             General bed mobility comments: Deferred, pt uses hoyer lift for transfers at baseline Patient Response: Cooperative  Transfers Overall transfer level: Needs assistance                 General transfer comment: Deferred, pt uses hoyer lift for transfers at baseline                                                 ADL either performed or assessed with clinical judgement   ADL Overall ADL's : Needs assistance/impaired                                             Vision Baseline Vision/History: 0  No visual deficits Patient Visual Report: No change from baseline                  Pertinent Vitals/Pain Pain Assessment Pain Assessment: No/denies pain (0/10 at rest, reports will have pain if trying to move R knee)     Hand Dominance Right   Extremity/Trunk Assessment Upper Extremity Assessment Upper Extremity Assessment: RUE deficits/detail;LUE deficits/detail RUE Deficits / Details: AROM: shoulder flexion limited to <90 deg, elbow flex/ext WNL, thumb  opposition intact, able to make full composite fist. Grip strength WFL, shoulder strength grossly 4-/5 RUE Sensation: WNL RUE Coordination: WNL LUE Deficits / Details: AROM: shoulder flexion WFL, elbow flex/ext WNL, difficulty with thumb opp for digits 4-5, unable to make full composite fist (pt reports difficulty with finger flexion from past injury). Poor grip strength, shoulder flexion grossly 4/5. LUE Sensation: decreased light touch (pt reports numbness/tingling in L wrist and hand) LUE Coordination: decreased fine motor   Lower Extremity Assessment Lower Extremity Assessment: RLE deficits/detail;LLE deficits/detail;Generalized weakness RLE: Unable to fully assess due to pain       Communication Communication Communication: No difficulties   Cognition Arousal/Alertness: Awake/alert Behavior During Therapy: WFL for tasks assessed/performed Overall Cognitive Status: Within Functional Limits for tasks assessed                                 General Comments: pleasant, engaged, cooperative     General Comments  pt with dressing to R knee, poor skin integrity on B feet (dry skin, peeling)               Home Living Family/patient expects to be discharged to:: Private residence Living Arrangements: Spouse/significant other;Children;Parent (currently living with parents to have 24/7 assist available, pt's house is right next door) Available Help at Discharge: Family;Available 24 hours/day Type of Home: House Home Access: Ramped entrance     Home Layout: Multi-level;Able to live on main level with bedroom/bathroom     Bathroom Shower/Tub: Walk-in shower;Other (comment)         Home Equipment: Wheelchair - manual;Rollator (4 wheels);Other (comment)   Additional Comments: Now has a hoyer lift and tempurpedic mattress, new adjustable bed, 3 lift chairs      Prior Functioning/Environment Prior Level of Function : Needs assist       Physical Assist :  Mobility (physical);ADLs (physical) Mobility (physical): Bed mobility;Transfers (Pt is not currently ambulatory) ADLs (physical): Feeding;Grooming;Bathing;Dressing;Toileting;IADLs Mobility Comments: Pt normally in bed, w/c, or lift chair throughout day; can mobilize self in house in w/c to some degree room to room. ADLs Comments: Pt reports receiving assistance from family for ADLs at baseline - wife helps with bathing, dressing, etc. Pt is not driving.        OT Problem List: Decreased strength;Decreased range of motion;Decreased activity tolerance;Pain;Impaired UE functional use;Decreased coordination      OT Treatment/Interventions: Self-care/ADL training;Therapeutic exercise;Patient/family education;Therapeutic activities;DME and/or AE instruction;Energy conservation    OT Goals(Current goals can be found in the care plan section) Acute Rehab OT Goals Patient Stated Goal: to get stronger, reduce pain with R knee OT Goal Formulation: With patient Time For Goal Achievement: 04/06/22 Potential to Achieve Goals: Good ADL Goals Pt Will Perform Eating: with min assist Pt Will Perform Grooming: with min assist;sitting Pt Will Perform Upper Body Dressing: with mod assist;sitting Pt/caregiver will Perform Home Exercise Program: With theraband;Both right and left upper extremity;With Supervision;Increased strength;Increased ROM  OT  Frequency: Min 2X/week                  AM-PAC OT "6 Clicks" Daily Activity     Outcome Measure Help from another person eating meals?: A Little Help from another person taking care of personal grooming?: A Lot Help from another person toileting, which includes using toliet, bedpan, or urinal?: A Lot Help from another person bathing (including washing, rinsing, drying)?: A Lot Help from another person to put on and taking off regular upper body clothing?: A Lot Help from another person to put on and taking off regular lower body clothing?: A Lot 6 Click  Score: 13   End of Session    Activity Tolerance: Patient tolerated treatment well Patient left: in bed;with call bell/phone within reach;with family/visitor present  OT Visit Diagnosis: Other abnormalities of gait and mobility (R26.89);Muscle weakness (generalized) (M62.81) Pain - Right/Left: Right Pain - part of body: Knee                Time: 1339-1406 OT Time Calculation (min): 27 min Charges:  OT General Charges $OT Visit: 1 Visit OT Evaluation $OT Eval Low Complexity: 1 Low  [facility] MS, OTR/L ascom [phone]  03/23/22, 3:53 PM

## 2022-03-23 NOTE — Consult Note (Signed)
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 male hx of OSA, obesity, hx of severe covid-19 with ARDS s/p trach now decannulated, c/b DVT, hx of colon resection s/p ostomy due to perforated diverticulitis and most recently right MSSA knee septic arthritis, treated from nov 2022-feb 2023 with abtx as well as serial arthroscopic wash out by dr Rudene Christians.  The patient was restarted with oral abtx in late march due to ongoing right knee pain and right foot cellulitis by his pcp, dr fitzgerald. He underwent repeat mri in late July where it appears he had bone infarcts as possible cause of knee pain. He was admitted on 8/2 for worsening pain and recent imaging is concerning for extensive AVN plus osteomyelitis of proxiaml tibial and distal femur. He underwent bone biopsy as well as arthrocentesis. Multiple cx were sent and thus far the tibia bone cx is showing staph aureus. He was started on cefazolin 2gm IV Q 8hr and ID asked to weigh in on management. His blood cx from 8/2 showing no growth to date.    Past Medical History:  Diagnosis Date   Gout    Hypertension    Rupture of bowel ([place])     Allergies:  Allergies  Allergen Reactions   Fentanyl Citrate Other (See Comments)    Makes patient more agitated & delirious   Tramadol Hcl     SEIZURES!!! DO NOT REORDER AT DISCHARGE!!!   Amoxicillin Rash    Tolerated cefepime and cefazolin 08/2020. Tolerated Zosyn 07/2021    MEDICATIONS:  allopurinol  400 mg Oral Daily   ascorbic acid  1,000 mg Oral Daily    budesonide  0.25 mg Nebulization BID   cholecalciferol  1,000 Units Oral Daily   colchicine  0.6 mg Oral Daily   cyanocobalamin  1,000 mcg Oral Daily   docusate sodium  100 mg Oral BID   DULoxetine  60 mg Oral Daily   famotidine  20 mg Oral BID   ferrous sulfate  325 mg Oral QODAY   folic acid  1 mg Oral Daily   hydroxychloroquine  200 mg Oral BID   loratadine  10 mg Oral Daily   magnesium oxide  400 mg Oral BID   metoprolol succinate  150 mg Oral Daily   multivitamin with minerals  1 tablet Oral Daily   polyethylene glycol  17 g Oral Daily   pregabalin  200 mg Oral BID   rivaroxaban  20 mg Oral QPM   zinc sulfate  220 mg Oral Daily    Social History   Tobacco Use   Smoking status: Never   Smokeless tobacco: Current    Types: Chew   Tobacco comments:    Not ready to quit.   Vaping Use   Vaping Use: Never used  Substance Use Topics   Alcohol use: Yes    Comment: pint to a fifth per day per patient . Patient reports he hasn't had anything to drink in three weeks.  Drug use: Never    Family History  Problem Relation Age of Onset   Healthy Mother    Healthy Father     Review of Systems - unable to obtain   OBJECTIVE: Temp:  [97.3 F (36.3 C)-99.1 F (37.3 C)] 98.2 F (36.8 C) (08/04 0713) Pulse Rate:  [96-114] 100 (08/04 0713) Resp:  [16-23] 16 (08/04 0713) BP: (127-146)/(58-78) 140/73 (08/04 0713) SpO2:  [89 %-98 %] 95 % (08/04 0713) Patient not available for video evaluation  LABS: Results for orders placed or performed during the hospital encounter of 03/21/22 (from the past 48 hour(s))  Glucose, capillary     Status: Abnormal   Collection Time: 03/21/22  5:08 PM  Result Value Ref Range   Glucose-Capillary 155 (H) 70 - 99 mg/dL    Comment: Glucose reference range applies only to samples taken after fasting for at least 8 hours.  Lactic acid, plasma     Status: Abnormal   Collection Time: 03/21/22  5:19 PM  Result Value Ref Range   Lactic Acid, Venous  2.1 (HH) 0.5 - 1.9 mmol/L    Comment: CRITICAL VALUE NOTED. VALUE IS CONSISTENT WITH PREVIOUSLY REPORTED/CALLED VALUE SKL Performed at [facility], [address] [place], [place] [address]   Glucose, capillary     Status: Abnormal   Collection Time: 03/21/22  9:21 PM  Result Value Ref Range   Glucose-Capillary 159 (H) 70 - 99 mg/dL    Comment: Glucose reference range applies only to samples taken after fasting for at least 8 hours.  Basic metabolic panel     Status: Abnormal   Collection Time: 03/22/22  2:09 AM  Result Value Ref Range   Sodium 137 135 - 145 mmol/L   Potassium 4.6 3.5 - 5.1 mmol/L   Chloride 102 98 - 111 mmol/L   CO2 29 22 - 32 mmol/L   Glucose, Bld 170 (H) 70 - 99 mg/dL    Comment: Glucose reference range applies only to samples taken after fasting for at least 8 hours.   BUN 10 6 - 20 mg/dL   Creatinine, Ser 0.65 0.61 - 1.24 mg/dL   Calcium 9.5 8.9 - 10.3 mg/dL   GFR, Estimated >60 >60 mL/min    Comment: (NOTE) Calculated using the CKD-EPI Creatinine Equation (2021)    Anion gap 6 5 - 15    Comment: Performed at [facility], [address] [place], [place] [address]  CBC     Status: Abnormal   Collection Time: 03/22/22  2:09 AM  Result Value Ref Range   WBC 8.7 4.0 - 10.5 K/uL   RBC 4.69 4.22 - 5.81 MIL/uL   Hemoglobin 13.0 13.0 - 17.0 g/dL   HCT 40.9 39.0 - 52.0 %   MCV 87.2 80.0 - 100.0 fL   MCH 27.7 26.0 - 34.0 pg   MCHC 31.8 30.0 - 36.0 g/dL   RDW 15.9 (H) 11.5 - 15.5 %   Platelets 270 150 - 400 K/uL   nRBC 0.0 0.0 - 0.2 %    Comment: Performed at [facility], [address], [place], [place] [address]  Magnesium     Status: None   Collection Time: 03/22/22  2:09 AM  Result Value Ref Range   Magnesium 2.2 1.7 - 2.4 mg/dL    Comment: Performed at [facility], [address], [place], [place] [address]  Iron and TIBC     Status: Abnormal   Collection Time: 03/22/22  2:09  AM  Result Value Ref Range    Iron 23 (L) 45 - 182 ug/dL   TIBC 354 250 - 450 ug/dL   Saturation Ratios 7 (L) 17.9 - 39.5 %   UIBC 331 ug/dL    Comment: Performed at [facility], [address] [place], [place] [address]  Ferritin     Status: None   Collection Time: 03/22/22  2:09 AM  Result Value Ref Range   Ferritin 215 24 - 336 ng/mL    Comment: Performed at [facility], [address] [place], [place] [address]  Aerobic/Anaerobic Culture w Gram Stain (surgical/deep wound)     Status: None (Preliminary result)   Collection Time: 03/22/22 10:30 AM   Specimen: PATH Bone biopsy; Tissue  Result Value Ref Range   Specimen Description      BONE BIOPSY 1 Performed at [facility], [address], [place], [place] [address]    Special Requests      NONE Performed at [facility], [place], [place] [address]    Gram Stain NO WBC SEEN NO ORGANISMS SEEN     Culture      NO GROWTH < 24 HOURS Performed at [facility], [place] [address], [place], [place] [address]    Report Status PENDING   Aerobic/Anaerobic Culture w Gram Stain (surgical/deep wound)     Status: None (Preliminary result)   Collection Time: 03/22/22 10:30 AM   Specimen: PATH Other; Body Fluid  Result Value Ref Range   Specimen Description      BONE RIGHT TIBIA BIOPSY Performed at [facility], [address] [place], [place] [address]    Special Requests      NONE Performed at [facility], [place], [place] [address]    Gram Stain NO WBC SEEN FEW GRAM POSITIVE COCCI     Culture      TOO YOUNG TO READ Performed at [facility], [place] [address], [place], [place] [address]    Report Status PENDING   Aerobic/Anaerobic Culture w Gram Stain (surgical/deep wound)     Status: None (Preliminary result)   Collection Time: 03/22/22 10:31 AM   Specimen: PATH Other; Body Fluid  Result Value Ref Range   Specimen Description      BONE RIGHT FEMUR  BIOPSY 2 Performed at [facility], [address] [place],  [address]    Special Requests      NONE Performed at [facility], [place], [place] [address]    Gram Stain NO WBC SEEN NO ORGANISMS SEEN     Culture      NO GROWTH < 24 HOURS Performed at [facility], [place] [address], [place],  [address]    Report Status PENDING   Surgical pathology     Status: None   Collection Time: 03/22/22 10:36 AM  Result Value Ref Range   SURGICAL PATHOLOGY      SURGICAL PATHOLOGY CASE: [id number] PATIENT: Harlin Heys Surgical Pathology Report     Specimen Submitted: A. Femur bone, right; bx B. Tibia bone, right; bx  Clinical History: Femur tibia bone biospy    DIAGNOSIS: A. BONE, RIGHT DISTAL FEMUR; BIOPSY: - MINUTE DISAGGREGATED FRAGMENTS OF BENIGN BONE WITH ASSOCIATED CHRONIC INFLAMMATORY CHANGES. - NO DEFINITE EVIDENCE OF ACUTE OSTEOMYELITIS. - NO EVIDENCE OF MALIGNANCY.  B. BONE, RIGHT PROXIMAL TIBIA; BIOPSY: - MINUTE DISAGGREGATED FRAGMENTS OF BENIGN BONE WITH  ASSOCIATED AVASCULAR NECROSIS AND CHRONIC INFLAMMATORY CHANGES. - NO DEFINITE EVIDENCE OF ACUTE OSTEOMYELITIS. - NO EVIDENCE OF MALIGNANCY.  GROSS DESCRIPTION: A. Labeled: Right femur bone biopsy Received: Fresh Collection time: 10:36 AM on 03/22/2022 Placed into formalin time: 11:42 AM on 03/22/2022 Tissue fragment(s): Multiple Size: Aggregate, 1.1 x 0.5 x 0.1 cm Description: Received are pink bony fragments of tissue Entirely submitted  in 1 cassette.  B. Labeled: Right tibia bone biopsy Received: Fresh Collection time: 10:30 AM on 03/22/2022 Placed into formalin time: 11:42 AM on 03/22/2022 Tissue fragment(s): Multiple Size: Aggregate, 1.1 x 0.5 x 0.1 cm Description: Received are pink to yellow bony fragments of tissue Entirely submitted in 1 cassette.  CM 03/22/2022  Final Diagnosis performed by Allena Napoleon, MD.   Electronically  signed 03/23/2022 10:10:36AM The electronic signature indicates that the named Attending Pathologist has evaluated the specimen Technical component performed at [facility], [address], [place], [place] [address] Lab: [phone] Dir: Rush Farmer, MD, MMM  Professional component performed at [facility], [facility], [place], [place], [place] [address] Lab: [phone] Dir: Kathi Simpers, MD   Aerobic/Anaerobic Culture w Gram Stain (surgical/deep wound)     Status: None (Preliminary result)   Collection Time: 03/22/22 10:46 AM   Specimen: PATH Other; Body Fluid  Result Value Ref Range   Specimen Description      BONE RIGHT TIBIA BIOPSY 2 Performed at [facility], [address] [place], [place] [address]    Special Requests      NONE Performed at [facility], [place], [place] [address]    Gram Stain NO WBC SEEN RARE GRAM POSITIVE COCCI     Culture      CULTURE REINCUBATED FOR BETTER GROWTH Performed at [facility], [place] [address] [place], [place] [address]    Report Status PENDING     MICRO: 8/3 tibia - staph aureus 8/2 blood cx NGTD IMAGING: DG Knee 1-2 Views Right  Result Date: 03/22/2022 CLINICAL DATA:  Bone biopsy EXAM: RIGHT KNEE - 1 VIEW COMPARISON:  Radiograph dated March 21, 2022 FINDINGS: Fluoroscopic images were obtained intraoperatively and submitted for post operative interpretation. Single image demonstrates bone biopsy with 34 seconds of fluoroscopy time and 2.24 mGy. Please see the performing provider's procedural report for further detail. IMPRESSION: Fluoroscopic images demonstrate bone biopsy. Electronically Signed   By: Yetta Glassman M.D.   On: 03/22/2022 11:02   DG C-Arm 1-60 Min-No Report  Result Date: 03/22/2022 Fluoroscopy was utilized by the requesting physician.  No radiographic interpretation.    Assessment/Plan:  31yo M with prior hx of MSSA right native septic arthritis now  has sequelae/recurrence of MSSA infection with proximal tibial osteomyelitis - continue on cefazolin 2gm IV Q8hr - if blood cx remain negative at 72hrs, can place picc line with plan to treat for 6 wk, then likely prolonged oral abtx suppression - Dr. Rudene Christians mentioned that this may require tertiary care for further management in the future/progress of infectious process. - will follow up tomorrow for further recommendations  Caren Griffins B. [place] for Infectious Diseases [phone]

## 2022-03-23 NOTE — Plan of Care (Signed)

## 2022-03-23 NOTE — Plan of Care (Signed)
  Problem: Coping: Goal: Level of anxiety will decrease Outcome: Progressing   Problem: Elimination: Goal: Will not experience complications related to bowel motility Outcome: Progressing   Problem: Pain Managment: Goal: General experience of comfort will improve Outcome: Progressing   Problem: Safety: Goal: Ability to remain free from injury will improve Outcome: Progressing   

## 2022-03-23 NOTE — Evaluation (Signed)
Physical Therapy Evaluation Patient Details Name: Craig [name] MRN: [id number] DOB: 11-04-1990 Today's Date: 03/23/2022  History of Present Illness  Craig [name] is a 56yoM who comes to [facility] on 03/21/22 after progression of Rt knee pain and swelling- history of recurrent infection in joint with multiple admissions. Pain/swelling progressing since 7/21. MRI revealing of osteo in Rt distal femur and proximal tibia. Pt went to OR with Dr. Rudene Christians for biopsy. PMH: inflammatory arthritis, DVT on xarelto, COVID19 infection c prolonged critical illness Dec '21-Feb '22, post covid tachycardia, CRF on 2L O2, bactremia, PNA, ostomy, gout. Recent hospitalization required intubatation, seizures, quadriparesis.  Clinical Impression  Pt admitted c above Dx. Pt shows functional limitations due to the deficits listed below (see "PT Problem List"). Patient agreeable to PT evaluation. Pt familiar to author from prior admissions. PLOF and home setup obtained, updated. Pt has extensive DME at home to meet his needs. His baseline is hoyer transfers from bed to [facility] or bed to lift chair. Pt has been successful in progression of BUE fitness regimen with weights at home with assist/participation from family. Pt has not current acute skilled PT needs at this time. Will await results of biopsy and plans for response. PT services may be indicated in future pending future procedures to address Rt knee infection.       Recommendations for follow up therapy are one component of a multi-disciplinary discharge planning process, led by the attending physician.  Recommendations may be updated based on patient status, additional functional criteria and insurance authorization.  Follow Up Recommendations No PT follow up      Assistance Recommended at Discharge None  Patient can return home with the following       Equipment Recommendations None recommended by PT  Recommendations for Other Services       Functional Status  Assessment Patient has not had a recent decline in their functional status     Precautions / Restrictions        Mobility  Bed Mobility Overal bed mobility:  (deferred; pt uses hoyer lift for transfers at baseline.)                  Transfers                        Ambulation/Gait                  Stairs            Wheelchair Mobility    Modified Rankin (Stroke Patients Only)       Balance                                             Pertinent Vitals/Pain Pain Assessment Pain Assessment: No/denies pain (no pain while still)    Home Living Family/patient expects to be discharged to:: Private residence Living Arrangements: Spouse/significant other;Children;Parent Available Help at Discharge: Family;Available 24 hours/day Type of Home: House Home Access: Ramped entrance       Home Layout: Multi-level;Able to live on main level with bedroom/bathroom Home Equipment: Wheelchair - manual;Rollator (4 wheels);Other (comment) Additional Comments: Now has a hoyer lift and tempurpedic mattress, has a new adjustable bed; has 3 lift chairs; is in bed most; can mobilize self in house in [facility] to some degree room to room.    Prior Function  Hand Dominance        Extremity/Trunk Assessment   Upper Extremity Assessment Upper Extremity Assessment: Overall WFL for tasks assessed    Lower Extremity Assessment Lower Extremity Assessment: Overall WFL for tasks assessed       Communication      Cognition Arousal/Alertness: Awake/alert Behavior During Therapy: WFL for tasks assessed/performed Overall Cognitive Status: Within Functional Limits for tasks assessed                                          General Comments      Exercises     Assessment/Plan    PT Assessment Patient does not need any further PT services  PT Problem List         PT Treatment Interventions       PT Goals (Current goals can be found in the Care Plan section)  Acute Rehab PT Goals PT Goal Formulation: All assessment and education complete, DC therapy    Frequency       Co-evaluation               AM-PAC PT "6 Clicks" Mobility  Outcome Measure Help needed turning from your back to your side while in a flat bed without using bedrails?: Total Help needed moving from lying on your back to sitting on the side of a flat bed without using bedrails?: Total Help needed moving to and from a bed to a chair (including a wheelchair)?: Total Help needed standing up from a chair using your arms (e.g., wheelchair or bedside chair)?: Total Help needed to walk in hospital room?: Total Help needed climbing 3-5 steps with a railing? : Total 6 Click Score: 6    End of Session   Activity Tolerance: Patient tolerated treatment well;No increased pain Patient left: in bed   PT Visit Diagnosis: Other abnormalities of gait and mobility (R26.89)    Time: 9449-6759 PT Time Calculation (min) (ACUTE ONLY): 29 min   Charges:   PT Evaluation $PT Eval Low Complexity: 1 Low         11:30 AM, 03/23/22 [name], PT, DPT Physical Therapist - [facility]  [phone] ([place])    [place] 03/23/2022, 11:26 AM

## 2022-03-23 NOTE — Progress Notes (Addendum)
Progress Note    Craig [name]  HER:[id number] DOB: [birth year]  DOA: 03/21/2022 PCP: [facility]      Brief Narrative:    Medical records reviewed and are as summarized below:  Craig [name] is a 31 y.o. male with medical history significant for morbid obesity (BMI 35.95), obstructive sleep apnea on CPAP, history of inflammatory arthritis on methotrexate and prednisone, ARDS from COVID-19 infection requiring tracheostomy subsequently decannulated, upper extremity DVT on Xarelto, history of perforated diverticulitis status post colonic resection and ostomy creation, history of right knee septic arthritis (MSSA) status post washout, status post IV antibiotics for MSSA septic arthritis of over 3 months.  He presented to the hospital because of right knee pain, redness and swelling.  He had an MRI of the right knee on 03/09/2022 prior to admission and report is as follows:  1. Extensive bone infarct involving the proximal tibial metaphysis extending to the articular surface both medially and laterally and into the diaphysis. Small amount of fluid tibial infarct which communicates with the acute particular surface of the lateral tibial plateau. Mild heterogeneous edema within the tibial bone infarct. An element of underlying chronic osteomyelitis cannot be excluded. 2. Extensive bone infarct involving the distal femoral metaphysis extending to the epiphysis. Small bone infarct in the proximal fibula and patella. 3. Extensive tricompartmental full-thickness cartilage loss consistent with advanced osteoarthritis. 4. Maceration of the medial and lateral menisci.   He was admitted to the hospital for avascular necrosis versus possible osteomyelitis of the right knee.  He was treated with analgesics.  He underwent biopsy of the right tibia and femur with cultures.     Assessment/Plan:   Principal Problem:   Osteomyelitis of right knee region  [facility]) Active Problems:   Obesity (BMI 30-39.9)   Hypertension   Gout   Colostomy status (HCC)   Nicotine dependence   DVT (deep venous thrombosis) (HCC)   Avascular necrosis of bone (HCC), right knee   Iron deficiency anemia   Body mass index is 36.94 kg/m.  (Obesity)  Right knee (proximal tibia and distal femur) avascular necrosis versus possible osteomyelitis, history of MSSA right knee septic arthritis November 2022: S/p biopsy of tibia and femur with cultures on 03/22/2022.  Synovial fluid Gram stain showed gram-positive cocci.  Consulted Dr. Graylon Good, [place] specialist, to assist with management.  Continue IV cefazolin for now until further recommendations from ID.  Analgesics as needed for pain.  Follow-up with orthopedic surgeon.    Iron deficiency anemia: Continue ferrous sulfate.  Outpatient follow-up with PCP for further evaluation   Latest Reference Range & Units Most Recent  Iron 45 - 182 ug/dL 23 (L) 03/22/22 02:09  UIBC ug/dL 331 03/22/22 02:09  TIBC 250 - 450 ug/dL 354 03/22/22 02:09  Saturation Ratios 17.9 - 39.5 % 7 (L) 03/22/22 02:09  Ferritin 24 - 336 ng/mL 215 03/22/22 02:09  Folate >5.9 ng/mL 34.0 09/01/21 07:40  (L): Data is abnormally low  Hypertension: Continue metoprolol  Gout: Continue colchicine and allopurinol  Intermittent jerky movement of right leg when he is sleeping: Unknown if he has restless leg syndrome.  Continue ferrous sulfate for iron deficiency anemia  Chronic hypoxic and hypercapnic respiratory failure, OSA: He said he uses trilogy machine at night at home.  He is tolerating CPAP in the hospital.  Other comorbidities include history of DVT on Xarelto, tobacco use disorder, colostomy status,  Diet Order  Diet Carb Modified Fluid consistency: Thin; Room service appropriate? Yes  Diet effective now                            Consultants: Orthopedic surgeon  Procedures: S/p biopsy of TBI and male with cultures on  03/22/2022.    Medications:    allopurinol  400 mg Oral Daily   ascorbic acid  1,000 mg Oral Daily   budesonide  0.25 mg Nebulization BID   cholecalciferol  1,000 Units Oral Daily   colchicine  0.6 mg Oral Daily   cyanocobalamin  1,000 mcg Oral Daily   docusate sodium  100 mg Oral BID   DULoxetine  60 mg Oral Daily   famotidine  20 mg Oral BID   ferrous sulfate  325 mg Oral QODAY   folic acid  1 mg Oral Daily   hydroxychloroquine  200 mg Oral BID   loratadine  10 mg Oral Daily   magnesium oxide  400 mg Oral BID   metoprolol succinate  150 mg Oral Daily   multivitamin with minerals  1 tablet Oral Daily   polyethylene glycol  17 g Oral Daily   pregabalin  200 mg Oral BID   rivaroxaban  20 mg Oral QPM   zinc sulfate  220 mg Oral Daily   Continuous Infusions:   ceFAZolin (ANCEF) IV 2 g (03/23/22 0531)   methocarbamol (ROBAXIN) IV       Anti-infectives (From admission, onward)    Start     Dose/Rate Route Frequency Ordered Stop   03/22/22 1400  ceFAZolin (ANCEF) IVPB 2g/100 mL premix        2 g 200 mL/hr over 30 Minutes Intravenous Every 8 hours 03/22/22 1238     03/22/22 1000  hydroxychloroquine (PLAQUENIL) tablet 200 mg        200 mg Oral 2 times daily 03/22/22 0806                Family Communication/Anticipated D/C date and plan/Code Status   DVT prophylaxis: SCDs Start: 03/22/22 1238 SCDs Start: 03/21/22 0538 rivaroxaban (XARELTO) tablet 20 mg     Code Status: Full Code  Family Communication: None Disposition Plan: To be determined   Status is: Inpatient Remains inpatient appropriate because: Awaiting bone biopsy and culture results       Subjective:   He complains of pain in the right knee  Objective:    Vitals:   03/22/22 2023 03/22/22 2320 03/23/22 0509 03/23/22 0713  BP:  (!) 144/62 127/77 (!) 140/73  Pulse:  (!) 105 96 100  Resp:  '18 17 16  '$ Temp:  97.7 F (36.5 C) 99.1 F (37.3 C) 98.2 F (36.8 C)  TempSrc:      SpO2: 91% 95%  98% 95%  Weight:      Height:       No data found.   Intake/Output Summary (Last 24 hours) at 03/23/2022 1024 Last data filed at 03/23/2022 1018 Gross per 24 hour  Intake 2.66 ml  Output 2895 ml  Net -2892.34 ml   Filed Weights   03/21/22 0032 03/22/22 0928  Weight: 127 kg 127 kg    Exam:  GEN: NAD SKIN: Warm and dry EYES: No pallor or icterus ENT: MMM CV: RRR PULM: CTA B ABD: soft, obese, NT, +BS, +colostomy bag with yellowish stools CNS: AAO x 3, non focal EXT: Dressing on right knee is clean, dry and intact  Data Reviewed:   I have personally reviewed following labs and imaging studies:  Labs: Labs show the following:   Basic Metabolic Panel: Recent Labs  Lab 03/21/22 0034 03/22/22 0209  NA 136 137  K 3.5 4.6  CL 95* 102  CO2 27 29  GLUCOSE 143* 170*  BUN 6 10  CREATININE 0.65 0.65  CALCIUM 9.4 9.5  MG  --  2.2   GFR Estimated Creatinine Clearance: 183.3 mL/min (by C-G formula based on SCr of 0.65 mg/dL). Liver Function Tests: No results for input(s): "AST", "ALT", "ALKPHOS", "BILITOT", "PROT", "ALBUMIN" in the last 168 hours. No results for input(s): "LIPASE", "AMYLASE" in the last 168 hours. No results for input(s): "AMMONIA" in the last 168 hours. Coagulation profile Recent Labs  Lab 03/21/22 0515  INR 1.1    CBC: Recent Labs  Lab 03/21/22 0034 03/22/22 0209  WBC 9.2 8.7  NEUTROABS 6.1  --   HGB 13.5 13.0  HCT 41.7 40.9  MCV 87.1 87.2  PLT 246 270   Cardiac Enzymes: No results for input(s): "CKTOTAL", "CKMB", "CKMBINDEX", "TROPONINI" in the last 168 hours. BNP (last 3 results) No results for input(s): "PROBNP" in the last 8760 hours. CBG: Recent Labs  Lab 03/21/22 1708 03/21/22 2121  GLUCAP 155* 159*   D-Dimer: No results for input(s): "DDIMER" in the last 72 hours. Hgb A1c: No results for input(s): "HGBA1C" in the last 72 hours. Lipid Profile: No results for input(s): "CHOL", "HDL", "LDLCALC", "TRIG",  "CHOLHDL", "LDLDIRECT" in the last 72 hours. Thyroid function studies: No results for input(s): "TSH", "T4TOTAL", "T3FREE", "THYROIDAB" in the last 72 hours.  Invalid input(s): "FREET3" Anemia work up: Recent Labs    03/22/22 0209  FERRITIN 215  TIBC 354  IRON 23*   Sepsis Labs: Recent Labs  Lab 03/21/22 0034 03/21/22 0515 03/21/22 1719 03/22/22 0209  WBC 9.2  --   --  8.7  LATICACIDVEN  --  2.7* 2.1*  --     Microbiology Recent Results (from the past 240 hour(s))  Blood Culture (routine x 2)     Status: None (Preliminary result)   Collection Time: 03/21/22  5:15 AM   Specimen: BLOOD  Result Value Ref Range Status   Specimen Description BLOOD LEFT FA  Final   Special Requests   Final    BOTTLES DRAWN AEROBIC AND ANAEROBIC Blood Culture results may not be optimal due to an inadequate volume of blood received in culture bottles   Culture   Final    NO GROWTH 2 DAYS Performed at [facility], [address], [place], [place] [address]    Report Status PENDING  Incomplete  Blood Culture (routine x 2)     Status: None (Preliminary result)   Collection Time: 03/21/22  5:15 AM   Specimen: BLOOD  Result Value Ref Range Status   Specimen Description BLOOD RIGHT AC  Final   Special Requests   Final    BOTTLES DRAWN AEROBIC AND ANAEROBIC Blood Culture results may not be optimal due to an inadequate volume of blood received in culture bottles   Culture   Final    NO GROWTH 2 DAYS Performed at [facility], [address] [place], [place] [address]    Report Status PENDING  Incomplete  Aerobic/Anaerobic Culture w Gram Stain (surgical/deep wound)     Status: None (Preliminary result)   Collection Time: 03/22/22 10:30 AM   Specimen: PATH Bone biopsy; Tissue  Result Value Ref Range Status  Specimen Description   Final    BONE BIOPSY 1 Performed at [facility], [address] [place], [place] [address]    Special Requests   Final     NONE Performed at [facility], [place], [place] [address]    Gram Stain NO WBC SEEN NO ORGANISMS SEEN   Final   Culture   Final    NO GROWTH < 24 HOURS Performed at [facility], [place] [address] [place], [place] [address]    Report Status PENDING  Incomplete  Aerobic/Anaerobic Culture w Gram Stain (surgical/deep wound)     Status: None (Preliminary result)   Collection Time: 03/22/22 10:30 AM   Specimen: PATH Other; Body Fluid  Result Value Ref Range Status   Specimen Description   Final    BONE RIGHT TIBIA BIOPSY Performed at [facility], [address], [place], [place] [address]    Special Requests   Final    NONE Performed at [facility], [address] [place], [place] [address]    Gram Stain NO WBC SEEN FEW GRAM POSITIVE COCCI   Final   Culture   Final    TOO YOUNG TO READ Performed at [facility], [place] [address], [place], [place] [address]    Report Status PENDING  Incomplete  Aerobic/Anaerobic Culture w Gram Stain (surgical/deep wound)     Status: None (Preliminary result)   Collection Time: 03/22/22 10:31 AM   Specimen: PATH Other; Body Fluid  Result Value Ref Range Status   Specimen Description   Final    BONE RIGHT FEMUR BIOPSY 2 Performed at [facility], [address] [place], [place] [address]    Special Requests   Final    NONE Performed at [facility], [place], [place] [address]    Gram Stain NO WBC SEEN NO ORGANISMS SEEN   Final   Culture   Final    NO GROWTH < 24 HOURS Performed at [facility], [place] [address] [place], [place] [address]    Report Status PENDING  Incomplete  Aerobic/Anaerobic Culture w Gram Stain (surgical/deep wound)     Status: None (Preliminary result)   Collection Time: 03/22/22 10:46 AM   Specimen: PATH Other; Body Fluid  Result Value Ref Range Status   Specimen Description   Final    BONE RIGHT TIBIA BIOPSY  2 Performed at [facility], [address] [place], [place] [address]    Special Requests   Final    NONE Performed at [facility], [address] [place], [place] [address]    Gram Stain NO WBC SEEN RARE GRAM POSITIVE COCCI   Final   Culture   Final    CULTURE REINCUBATED FOR BETTER GROWTH Performed at [facility], [place] [address], [place], [place] [address]    Report Status PENDING  Incomplete    Procedures and diagnostic studies:  DG Knee 1-2 Views Right  Result Date: 03/22/2022 CLINICAL DATA:  Bone biopsy EXAM: RIGHT KNEE - 1 VIEW COMPARISON:  Radiograph dated March 21, 2022 FINDINGS: Fluoroscopic images were obtained intraoperatively and submitted for post operative interpretation. Single image demonstrates bone biopsy with 34 seconds of fluoroscopy time and 2.24 mGy. Please see the performing provider's procedural report for further detail. IMPRESSION: Fluoroscopic images demonstrate bone biopsy. Electronically Signed   By: Yetta Glassman M.D.   On: 03/22/2022 11:02   DG  C-Arm 1-60 Min-No Report  Result Date: 03/22/2022 Fluoroscopy was utilized by the requesting physician.  No radiographic interpretation.               LOS: 2 days   Sanvika Cuttino  [facility]   Pager on [email or web address]. If 7PM-7AM, please contact night-coverage at [email or web address]     03/23/2022, 10:24 AM

## 2022-03-24 DIAGNOSIS — G2581 Restless legs syndrome: Secondary | ICD-10-CM | POA: Diagnosis present

## 2022-03-24 DIAGNOSIS — M869 Osteomyelitis, unspecified: Secondary | ICD-10-CM | POA: Diagnosis not present

## 2022-03-24 DIAGNOSIS — Z933 Colostomy status: Secondary | ICD-10-CM | POA: Diagnosis not present

## 2022-03-24 DIAGNOSIS — M8700 Idiopathic aseptic necrosis of unspecified bone: Secondary | ICD-10-CM | POA: Diagnosis not present

## 2022-03-24 DIAGNOSIS — D509 Iron deficiency anemia, unspecified: Secondary | ICD-10-CM | POA: Diagnosis not present

## 2022-03-24 NOTE — Progress Notes (Addendum)
Progress Note    Craig [name]  MGQ:[id number] DOB: 1990/09/22  DOA: 03/21/2022 PCP: [facility]      Brief Narrative:    Medical records reviewed and are as summarized below:  Craig [name] is a 31 y.o. male with medical history significant for morbid obesity (BMI 35.95), obstructive sleep apnea on CPAP, history of inflammatory arthritis on methotrexate and prednisone, ARDS from COVID-19 infection requiring tracheostomy subsequently decannulated, upper extremity DVT on Xarelto, history of perforated diverticulitis status post colonic resection and ostomy creation, history of right knee septic arthritis (MSSA) status post washout, status post IV antibiotics for MSSA septic arthritis of over 3 months.  He presented to the hospital because of right knee pain, redness and swelling.  He had an MRI of the right knee on 03/09/2022 prior to admission and report is as follows:  1. Extensive bone infarct involving the proximal tibial metaphysis extending to the articular surface both medially and laterally and into the diaphysis. Small amount of fluid tibial infarct which communicates with the acute particular surface of the lateral tibial plateau. Mild heterogeneous edema within the tibial bone infarct. An element of underlying chronic osteomyelitis cannot be excluded. 2. Extensive bone infarct involving the distal femoral metaphysis extending to the epiphysis. Small bone infarct in the proximal fibula and patella. 3. Extensive tricompartmental full-thickness cartilage loss consistent with advanced osteoarthritis. 4. Maceration of the medial and lateral menisci.   He was admitted to the hospital for avascular necrosis versus possible osteomyelitis of the right knee.  He was treated with analgesics.  He underwent biopsy of the right tibia and femur with cultures.     Assessment/Plan:   Principal Problem:   Osteomyelitis of right knee region  [facility]) Active Problems:   Obesity (BMI 30-39.9)   Hypertension   Gout   Colostomy status (HCC)   Nicotine dependence   DVT (deep venous thrombosis) (HCC)   Avascular necrosis of bone (HCC), right knee   Iron deficiency anemia   Body mass index is 36.94 kg/m.  (Obesity)  Right knee (proximal tibia and distal femur) avascular necrosis versus possible osteomyelitis, history of MSSA right knee septic arthritis November 2022: S/p biopsy of tibia and femur with cultures on 03/22/2022.  Synovial fluid Gram stain showed gram-positive cocci and culture is growing Staph aureus.  Appreciate input from Dr. Graylon Good, [place] specialist.  Continue IV cefazolin.  Continue analgesics as needed for pain.  Follow-up with orthopedic surgeon.  Iron deficiency anemia: Continue ferrous sulfate.  Outpatient follow-up with PCP for further evaluation   Latest Reference Range & Units Most Recent  Iron 45 - 182 ug/dL 23 (L) 03/22/22 02:09  UIBC ug/dL 331 03/22/22 02:09  TIBC 250 - 450 ug/dL 354 03/22/22 02:09  Saturation Ratios 17.9 - 39.5 % 7 (L) 03/22/22 02:09  Ferritin 24 - 336 ng/mL 215 03/22/22 02:09  Folate >5.9 ng/mL 34.0 09/01/21 07:40  (L): Data is abnormally low  Hypertension, chronic sinus tachycardia: Continue metoprolol  Gout: Continue colchicine and allopurinol  Restless leg syndrome: Start ropinirole.  Discussed potential side effects of ropinirole with the patient and his wife at the bedside and they are willing to try it.  Continue ferrous sulfate.  Chronic hypoxic and hypercapnic respiratory failure, OSA: He said he uses trilogy machine at night at home.  He is tolerating CPAP in the hospital.  Stage I left buttock decubitus ulcer (present on admission): Turn patient every 2 hours.  Other comorbidities include history  of DVT on Xarelto, tobacco use disorder, colostomy status, bedbound status  Diet Order             Diet Carb Modified Fluid consistency: Thin; Room service appropriate? Yes  Diet  effective now                            Consultants: Orthopedic surgeon  Procedures: S/p biopsy of TBI and male with cultures on 03/22/2022.    Medications:    allopurinol  400 mg Oral Daily   ascorbic acid  1,000 mg Oral Daily   budesonide  0.25 mg Nebulization BID   cholecalciferol  1,000 Units Oral Daily   colchicine  0.6 mg Oral Daily   cyanocobalamin  1,000 mcg Oral Daily   docusate sodium  100 mg Oral BID   DULoxetine  60 mg Oral Daily   famotidine  20 mg Oral BID   ferrous sulfate  325 mg Oral QODAY   folic acid  1 mg Oral Daily   hydroxychloroquine  200 mg Oral BID   loratadine  10 mg Oral Daily   magnesium oxide  400 mg Oral BID   metoprolol succinate  150 mg Oral Daily   multivitamin with minerals  1 tablet Oral Daily   polyethylene glycol  17 g Oral Daily   pregabalin  200 mg Oral BID   rivaroxaban  20 mg Oral QPM   zinc sulfate  220 mg Oral Daily   Continuous Infusions:   ceFAZolin (ANCEF) IV 2 g (03/24/22 0524)   methocarbamol (ROBAXIN) IV       Anti-infectives (From admission, onward)    Start     Dose/Rate Route Frequency Ordered Stop   03/22/22 1400  ceFAZolin (ANCEF) IVPB 2g/100 mL premix        2 g 200 mL/hr over 30 Minutes Intravenous Every 8 hours 03/22/22 1238     03/22/22 1000  hydroxychloroquine (PLAQUENIL) tablet 200 mg        200 mg Oral 2 times daily 03/22/22 0806                Family Communication/Anticipated D/C date and plan/Code Status   DVT prophylaxis: SCDs Start: 03/22/22 1238 SCDs Start: 03/21/22 0538 rivaroxaban (XARELTO) tablet 20 mg     Code Status: Full Code  Family Communication: None Disposition Plan: To be determined   Status is: Inpatient Remains inpatient appropriate because: Awaiting bone biopsy and culture results       Subjective:   Interval events noted.  He complains of right knee pain.  His wife is at the bedside.  His wife reported that patient has intermittent movement  of bilateral legs when he is sleeping and this has been going on for some time now.  Objective:    Vitals:   03/24/22 0858 03/24/22 0910 03/24/22 1004 03/24/22 1023  BP: 131/75     Pulse: (!) 114 (!) 110 (!) 104 100  Resp: 18     Temp: 98.2 F (36.8 C)     TempSrc: Oral     SpO2: 94%     Weight:      Height:       No data found.   Intake/Output Summary (Last 24 hours) at 03/24/2022 1211 Last data filed at 03/24/2022 0215 Gross per 24 hour  Intake 240 ml  Output 3640 ml  Net -3400 ml   Filed Weights   03/21/22 0032 03/22/22 0928  Weight:  127 kg 127 kg    Exam:  GEN: NAD SKIN: Warm and dry.  Stage I left buttock decubitus ulcer (present on admission) EYES: No pallor or icterus ENT: MMM CV: RRR PULM: CTA B ABD: soft, obese, NT, +BS, + colostomy bag CNS: AAO x 3, non focal EXT: Dressing on right knee is clean, dry and intact       Data Reviewed:   I have personally reviewed following labs and imaging studies:  Labs: Labs show the following:   Basic Metabolic Panel: Recent Labs  Lab 03/21/22 0034 03/22/22 0209  NA 136 137  K 3.5 4.6  CL 95* 102  CO2 27 29  GLUCOSE 143* 170*  BUN 6 10  CREATININE 0.65 0.65  CALCIUM 9.4 9.5  MG  --  2.2   GFR Estimated Creatinine Clearance: 183.3 mL/min (by C-G formula based on SCr of 0.65 mg/dL). Liver Function Tests: No results for input(s): "AST", "ALT", "ALKPHOS", "BILITOT", "PROT", "ALBUMIN" in the last 168 hours. No results for input(s): "LIPASE", "AMYLASE" in the last 168 hours. No results for input(s): "AMMONIA" in the last 168 hours. Coagulation profile Recent Labs  Lab 03/21/22 0515  INR 1.1    CBC: Recent Labs  Lab 03/21/22 0034 03/22/22 0209  WBC 9.2 8.7  NEUTROABS 6.1  --   HGB 13.5 13.0  HCT 41.7 40.9  MCV 87.1 87.2  PLT 246 270   Cardiac Enzymes: No results for input(s): "CKTOTAL", "CKMB", "CKMBINDEX", "TROPONINI" in the last 168 hours. BNP (last 3 results) No results for input(s):  "PROBNP" in the last 8760 hours. CBG: Recent Labs  Lab 03/21/22 1708 03/21/22 2121  GLUCAP 155* 159*   D-Dimer: No results for input(s): "DDIMER" in the last 72 hours. Hgb A1c: No results for input(s): "HGBA1C" in the last 72 hours. Lipid Profile: No results for input(s): "CHOL", "HDL", "LDLCALC", "TRIG", "CHOLHDL", "LDLDIRECT" in the last 72 hours. Thyroid function studies: No results for input(s): "TSH", "T4TOTAL", "T3FREE", "THYROIDAB" in the last 72 hours.  Invalid input(s): "FREET3" Anemia work up: Recent Labs    03/22/22 0209  FERRITIN 215  TIBC 354  IRON 23*   Sepsis Labs: Recent Labs  Lab 03/21/22 0034 03/21/22 0515 03/21/22 1719 03/22/22 0209  WBC 9.2  --   --  8.7  LATICACIDVEN  --  2.7* 2.1*  --     Microbiology Recent Results (from the past 240 hour(s))  Blood Culture (routine x 2)     Status: None (Preliminary result)   Collection Time: 03/21/22  5:15 AM   Specimen: BLOOD  Result Value Ref Range Status   Specimen Description BLOOD LEFT FA  Final   Special Requests   Final    BOTTLES DRAWN AEROBIC AND ANAEROBIC Blood Culture results may not be optimal due to an inadequate volume of blood received in culture bottles   Culture   Final    NO GROWTH 3 DAYS Performed at [facility], [address] [place],  [address]    Report Status PENDING  Incomplete  Blood Culture (routine x 2)     Status: None (Preliminary result)   Collection Time: 03/21/22  5:15 AM   Specimen: BLOOD  Result Value Ref Range Status   Specimen Description BLOOD RIGHT [facility]  Final   Special Requests   Final    BOTTLES DRAWN AEROBIC AND ANAEROBIC Blood Culture results may not be optimal due to an inadequate volume of blood received in culture bottles   Culture  Final    NO GROWTH 3 DAYS Performed at [facility], [address] [place], [place] [address]    Report Status PENDING  Incomplete  Aerobic/Anaerobic Culture w Gram Stain (surgical/deep wound)      Status: None (Preliminary result)   Collection Time: 03/22/22 10:30 AM   Specimen: PATH Bone biopsy; Tissue  Result Value Ref Range Status   Specimen Description   Final    BONE BIOPSY 1 Performed at [facility], [address] [place], [place] [address]    Special Requests   Final    NONE Performed at [facility], [place], [place] [address]    Gram Stain NO WBC SEEN NO ORGANISMS SEEN   Final   Culture   Final    NO GROWTH < 24 HOURS Performed at [facility], [place] [address], [place], [place] [address]    Report Status PENDING  Incomplete  Aerobic/Anaerobic Culture w Gram Stain (surgical/deep wound)     Status: None (Preliminary result)   Collection Time: 03/22/22 10:30 AM   Specimen: PATH Other; Body Fluid  Result Value Ref Range Status   Specimen Description   Final    BONE RIGHT TIBIA BIOPSY Performed at [facility], [address] [place], [place] [address]    Special Requests   Final    NONE Performed at [facility], [address] [place], [place] [address]    Gram Stain NO WBC SEEN FEW GRAM POSITIVE COCCI   Final   Culture   Final    TOO YOUNG TO READ Performed at [facility], [place] [address], [place], [place] [address]    Report Status PENDING  Incomplete  Aerobic/Anaerobic Culture w Gram Stain (surgical/deep wound)     Status: None (Preliminary result)   Collection Time: 03/22/22 10:31 AM   Specimen: PATH Other; Body Fluid  Result Value Ref Range Status   Specimen Description   Final    BONE RIGHT FEMUR BIOPSY 2 Performed at [facility], [address] [place], [place] [address]    Special Requests   Final    NONE Performed at [facility], [place], [place] [address]    Gram Stain NO WBC SEEN NO ORGANISMS SEEN   Final   Culture   Final    NO GROWTH < 24 HOURS Performed at [facility], [place] [address], [place], [place] [address]     Report Status PENDING  Incomplete  Aerobic/Anaerobic Culture w Gram Stain (surgical/deep wound)     Status: None (Preliminary result)   Collection Time: 03/22/22 10:46 AM   Specimen: PATH Other; Body Fluid  Result Value Ref Range Status   Specimen Description   Final    BONE RIGHT TIBIA BIOPSY 2 Performed at [facility], [address] [place], [place] [address]    Special Requests   Final    NONE Performed at [facility], [address] [place], [place] [address]    Gram Stain NO WBC SEEN RARE GRAM POSITIVE COCCI   Final   Culture   Final    RARE STAPHYLOCOCCUS AUREUS CULTURE REINCUBATED FOR BETTER GROWTH Performed at [facility], [place] [address] [place], [place] [address]    Report Status PENDING  Incomplete    Procedures and diagnostic studies:  No results found.             LOS: 3  days   Shayanna Thatch  [facility]   Pager on [email or web address]. If 7PM-7AM, please contact night-coverage at [email or web address]     03/24/2022, 12:11 PM

## 2022-03-24 NOTE — Plan of Care (Signed)

## 2022-03-24 NOTE — Progress Notes (Signed)
ID PROGRESS NOTE   31yo M with recurrent MSSA infection, now with osteomyelitis of tibia  -continue on cefazolin 2gm IV Q 8hr - blood cx are negative at 72hr, can place picc line with plan for 6 wk of IV abtx and then evaluate for chronic suppression for 4 months +  Reghan Thul B. [place] [facility] [phone]

## 2022-03-24 NOTE — Plan of Care (Signed)
  Problem: Nutrition: Goal: Adequate nutrition will be maintained Outcome: Progressing   Problem: Coping: Goal: Level of anxiety will decrease Outcome: Progressing   Problem: Elimination: Goal: Will not experience complications related to bowel motility Outcome: Progressing   Problem: Pain Managment: Goal: General experience of comfort will improve Outcome: Progressing   Problem: Safety: Goal: Ability to remain free from injury will improve Outcome: Progressing   

## 2022-03-25 ENCOUNTER — Inpatient Hospital Stay: Payer: Self-pay

## 2022-03-25 DIAGNOSIS — M869 Osteomyelitis, unspecified: Secondary | ICD-10-CM | POA: Diagnosis not present

## 2022-03-25 DIAGNOSIS — M8700 Idiopathic aseptic necrosis of unspecified bone: Secondary | ICD-10-CM | POA: Diagnosis not present

## 2022-03-25 DIAGNOSIS — G2581 Restless legs syndrome: Secondary | ICD-10-CM

## 2022-03-25 NOTE — Progress Notes (Addendum)
Progress Note    Craig [name]  WJX:[id number] DOB: 11/04/1990  DOA: 03/21/2022 PCP: [facility]      Brief Narrative:    Medical records reviewed and are as summarized below:  Craig [name] is a 31 y.o. male with medical history significant for morbid obesity (BMI 35.95), obstructive sleep apnea on CPAP, history of inflammatory arthritis on methotrexate and prednisone, ARDS from COVID-19 infection requiring tracheostomy subsequently decannulated, upper extremity DVT on Xarelto, history of perforated diverticulitis status post colonic resection and ostomy creation, history of right knee septic arthritis (MSSA) status post washout, status post IV antibiotics for MSSA septic arthritis of over 3 months.  He presented to the hospital because of right knee pain, redness and swelling.  He had an MRI of the right knee on 03/09/2022 prior to admission and report is as follows:  1. Extensive bone infarct involving the proximal tibial metaphysis extending to the articular surface both medially and laterally and into the diaphysis. Small amount of fluid tibial infarct which communicates with the acute particular surface of the lateral tibial plateau. Mild heterogeneous edema within the tibial bone infarct. An element of underlying chronic osteomyelitis cannot be excluded. 2. Extensive bone infarct involving the distal femoral metaphysis extending to the epiphysis. Small bone infarct in the proximal fibula and patella. 3. Extensive tricompartmental full-thickness cartilage loss consistent with advanced osteoarthritis. 4. Maceration of the medial and lateral menisci.   He was admitted to the hospital for avascular necrosis versus possible osteomyelitis of the right knee.  He was treated with analgesics.  He underwent biopsy of the right tibia and femur with cultures.     Assessment/Plan:   Principal Problem:   Osteomyelitis of right knee region  [facility]) Active Problems:   Obesity (BMI 30-39.9)   Hypertension   Gout   Colostomy status (HCC)   Nicotine dependence   DVT (deep venous thrombosis) (HCC)   Avascular necrosis of bone (HCC), right knee   Iron deficiency anemia   Restless legs syndrome   Body mass index is 36.94 kg/m.  (Obesity)  Right knee (proximal tibia and distal femur) avascular necrosis versus possible osteomyelitis, history of MSSA right knee septic arthritis November 2022: S/p biopsy of tibia and femur with cultures on 03/22/2022.  Synovial fluid Gram stain showed gram-positive cocci and culture is growing Staph aureus.  Sensitivity report is pending.  Continue IV cefazolin for 6 weeks per Dr. Crissie Figures recommendation.  PICC line has been ordered for a long time IV antibiotics.  Patient is hoping to see Dr. [name], [place] specialist, tomorrow.  Continue analgesics as needed for pain.  Iron deficiency anemia: Continue ferrous sulfate.  Outpatient follow-up with PCP for further evaluation   Latest Reference Range & Units Most Recent  Iron 45 - 182 ug/dL 23 (L) 03/22/22 02:09  UIBC ug/dL 331 03/22/22 02:09  TIBC 250 - 450 ug/dL 354 03/22/22 02:09  Saturation Ratios 17.9 - 39.5 % 7 (L) 03/22/22 02:09  Ferritin 24 - 336 ng/mL 215 03/22/22 02:09  Folate >5.9 ng/mL 34.0 09/01/21 07:40  (L): Data is abnormally low  Hypertension, episode of SVT on 03/24/2022, chronic sinus tachycardia: Continue metoprolol  Gout: Continue colchicine and allopurinol  Restless leg syndrome: His wife says she has already noticed some improvement. Continue ropinirole and ferrous sulfate.  Chronic hypoxic and hypercapnic respiratory failure, OSA: He said he uses trilogy machine at night at home.  He is on BiPAP at night in the hospital.  Stage I left buttock decubitus ulcer (present on admission): Turn patient every 2 hours.  Other comorbidities include history of DVT on Xarelto, tobacco use disorder, colostomy status, bedbound status  Diet  Order             Diet Carb Modified Fluid consistency: Thin; Room service appropriate? Yes  Diet effective now                            Consultants: Orthopedic surgeon  Procedures: S/p biopsy of TBI and male with cultures on 03/22/2022.    Medications:    allopurinol  400 mg Oral Daily   ascorbic acid  1,000 mg Oral Daily   budesonide  0.25 mg Nebulization BID   cholecalciferol  1,000 Units Oral Daily   colchicine  0.6 mg Oral Daily   cyanocobalamin  1,000 mcg Oral Daily   docusate sodium  100 mg Oral BID   DULoxetine  60 mg Oral Daily   famotidine  20 mg Oral BID   ferrous sulfate  325 mg Oral QODAY   folic acid  1 mg Oral Daily   hydroxychloroquine  200 mg Oral BID   loratadine  10 mg Oral Daily   magnesium oxide  400 mg Oral BID   metoprolol succinate  150 mg Oral Daily   multivitamin with minerals  1 tablet Oral Daily   polyethylene glycol  17 g Oral Daily   pregabalin  200 mg Oral BID   rivaroxaban  20 mg Oral QPM   zinc sulfate  220 mg Oral Daily   Continuous Infusions:   ceFAZolin (ANCEF) IV 2 g (03/25/22 0530)   methocarbamol (ROBAXIN) IV       Anti-infectives (From admission, onward)    Start     Dose/Rate Route Frequency Ordered Stop   03/22/22 1400  ceFAZolin (ANCEF) IVPB 2g/100 mL premix        2 g 200 mL/hr over 30 Minutes Intravenous Every 8 hours 03/22/22 1238     03/22/22 1000  hydroxychloroquine (PLAQUENIL) tablet 200 mg        200 mg Oral 2 times daily 03/22/22 0806                Family Communication/Anticipated D/C date and plan/Code Status   DVT prophylaxis: SCDs Start: 03/22/22 1238 SCDs Start: 03/21/22 0538 rivaroxaban (XARELTO) tablet 20 mg     Code Status: Full Code  Family Communication: None Disposition Plan: To be determined   Status is: Inpatient Remains inpatient appropriate because: Awaiting bone biopsy and culture results       Subjective:   He still has pain in the right knee  especially when he moves.  His wife said she did not notice any jerking movements of the legs when patient was sleeping.  Objective:    Vitals:   03/24/22 2056 03/25/22 0604 03/25/22 0810 03/25/22 0840  BP:  (!) 145/80 132/78   Pulse: 98 95 (!) 101 98  Resp: (!) '22 20 20 '$ (!) 25  Temp:  98 F (36.7 C) 97.8 F (36.6 C)   TempSrc:      SpO2: 98% 99% 98% 99%  Weight:      Height:       No data found.   Intake/Output Summary (Last 24 hours) at 03/25/2022 1128 Last data filed at 03/25/2022 0055 Gross per 24 hour  Intake --  Output 4100 ml  Net -4100 ml   Danley Danker  Weights   03/21/22 0032 03/22/22 0928  Weight: 127 kg 127 kg    Exam:  GEN: NAD SKIN: Warm and dry EYES: No pallor or icterus ENT: MMM CV: RRR PULM: CTA B ABD: soft, obese, NT, +BS, + colostomy CNS: AAO x 3, non focal EXT: Dressing on right knee is clean, dry and intact        Data Reviewed:   I have personally reviewed following labs and imaging studies:  Labs: Labs show the following:   Basic Metabolic Panel: Recent Labs  Lab 03/21/22 0034 03/22/22 0209  NA 136 137  K 3.5 4.6  CL 95* 102  CO2 27 29  GLUCOSE 143* 170*  BUN 6 10  CREATININE 0.65 0.65  CALCIUM 9.4 9.5  MG  --  2.2   GFR Estimated Creatinine Clearance: 183.3 mL/min (by C-G formula based on SCr of 0.65 mg/dL). Liver Function Tests: No results for input(s): "AST", "ALT", "ALKPHOS", "BILITOT", "PROT", "ALBUMIN" in the last 168 hours. No results for input(s): "LIPASE", "AMYLASE" in the last 168 hours. No results for input(s): "AMMONIA" in the last 168 hours. Coagulation profile Recent Labs  Lab 03/21/22 0515  INR 1.1    CBC: Recent Labs  Lab 03/21/22 0034 03/22/22 0209  WBC 9.2 8.7  NEUTROABS 6.1  --   HGB 13.5 13.0  HCT 41.7 40.9  MCV 87.1 87.2  PLT 246 270   Cardiac Enzymes: No results for input(s): "CKTOTAL", "CKMB", "CKMBINDEX", "TROPONINI" in the last 168 hours. BNP (last 3 results) No results for  input(s): "PROBNP" in the last 8760 hours. CBG: Recent Labs  Lab 03/21/22 1708 03/21/22 2121  GLUCAP 155* 159*   D-Dimer: No results for input(s): "DDIMER" in the last 72 hours. Hgb A1c: No results for input(s): "HGBA1C" in the last 72 hours. Lipid Profile: No results for input(s): "CHOL", "HDL", "LDLCALC", "TRIG", "CHOLHDL", "LDLDIRECT" in the last 72 hours. Thyroid function studies: No results for input(s): "TSH", "T4TOTAL", "T3FREE", "THYROIDAB" in the last 72 hours.  Invalid input(s): "FREET3" Anemia work up: No results for input(s): "VITAMINB12", "FOLATE", "FERRITIN", "TIBC", "IRON", "RETICCTPCT" in the last 72 hours.  Sepsis Labs: Recent Labs  Lab 03/21/22 0034 03/21/22 0515 03/21/22 1719 03/22/22 0209  WBC 9.2  --   --  8.7  LATICACIDVEN  --  2.7* 2.1*  --     Microbiology Recent Results (from the past 240 hour(s))  Blood Culture (routine x 2)     Status: None (Preliminary result)   Collection Time: 03/21/22  5:15 AM   Specimen: BLOOD  Result Value Ref Range Status   Specimen Description BLOOD LEFT FA  Final   Special Requests   Final    BOTTLES DRAWN AEROBIC AND ANAEROBIC Blood Culture results may not be optimal due to an inadequate volume of blood received in culture bottles   Culture   Final    NO GROWTH 4 DAYS Performed at [facility], [address] [place], [place] [address]    Report Status PENDING  Incomplete  Blood Culture (routine x 2)     Status: None (Preliminary result)   Collection Time: 03/21/22  5:15 AM   Specimen: BLOOD  Result Value Ref Range Status   Specimen Description BLOOD RIGHT [facility]  Final   Special Requests   Final    BOTTLES DRAWN AEROBIC AND ANAEROBIC Blood Culture results may not be optimal due to an inadequate volume of blood received in culture bottles   Culture   Final  NO GROWTH 4 DAYS Performed at [facility], [address] [place], [place] [address]    Report Status PENDING  Incomplete   Aerobic/Anaerobic Culture w Gram Stain (surgical/deep wound)     Status: None (Preliminary result)   Collection Time: 03/22/22 10:30 AM   Specimen: PATH Bone biopsy; Tissue  Result Value Ref Range Status   Specimen Description   Final    BONE BIOPSY 1 Performed at [facility], [address], [place], [place] [address]    Special Requests   Final    NONE Performed at [facility], [place], [place] [address]    Gram Stain NO WBC SEEN NO ORGANISMS SEEN   Final   Culture   Final    NO GROWTH 2 DAYS Performed at [facility], [place] [address], [place], [place] [address]    Report Status PENDING  Incomplete  Aerobic/Anaerobic Culture w Gram Stain (surgical/deep wound)     Status: None (Preliminary result)   Collection Time: 03/22/22 10:30 AM   Specimen: PATH Other; Body Fluid  Result Value Ref Range Status   Specimen Description   Final    BONE RIGHT TIBIA BIOPSY Performed at [facility], [address], [place], [place] [address]    Special Requests   Final    NONE Performed at [facility], [address] [place], [place] [address]    Gram Stain   Final    NO WBC SEEN FEW GRAM POSITIVE COCCI Performed at [facility], [place] [address] [place], [place] [address]    Culture   Final    FEW STAPHYLOCOCCUS AUREUS NO ANAEROBES ISOLATED; CULTURE IN PROGRESS FOR 5 DAYS    Report Status PENDING  Incomplete  Aerobic/Anaerobic Culture w Gram Stain (surgical/deep wound)     Status: None (Preliminary result)   Collection Time: 03/22/22 10:31 AM   Specimen: PATH Other; Body Fluid  Result Value Ref Range Status   Specimen Description   Final    BONE RIGHT FEMUR BIOPSY 2 Performed at [facility], [address], [place], [place] [address]    Special Requests   Final    NONE Performed at [facility], [address] [place], [place] [address]    Gram Stain NO WBC SEEN NO ORGANISMS SEEN    Final   Culture   Final    NO GROWTH 2 DAYS Performed at [facility], [place] [address], [place], [place] [address]    Report Status PENDING  Incomplete  Aerobic/Anaerobic Culture w Gram Stain (surgical/deep wound)     Status: None (Preliminary result)   Collection Time: 03/22/22 10:46 AM   Specimen: PATH Other; Body Fluid  Result Value Ref Range Status   Specimen Description   Final    BONE RIGHT TIBIA BIOPSY 2 Performed at [facility], [address], [place], [place] [address]    Special Requests   Final    NONE Performed at [facility], [address] [place], [place] [address]    Gram Stain   Final    NO WBC SEEN RARE GRAM POSITIVE COCCI Performed at [facility], [place] [address] [place],  [address]    Culture   Final    RARE STAPHYLOCOCCUS AUREUS SUSCEPTIBILITIES TO FOLLOW NO ANAEROBES ISOLATED; CULTURE IN PROGRESS FOR 5 DAYS    Report Status PENDING  Incomplete    Procedures and diagnostic studies:  [place] EKG SITE RITE  Result Date: 03/25/2022 If Site Rite image not attached, placement could not be confirmed due to current cardiac rhythm.              LOS: 4 days   Indy Prestwood  [facility]   Pager on [email or web address]. If 7PM-7AM, please contact night-coverage at [email or web address]     03/25/2022, 11:28 AM

## 2022-03-25 NOTE — Progress Notes (Signed)
Spoke with RN re PICC placement to be done 03/26/22 morning.  States pt has adequate PIV access at this time.

## 2022-03-25 NOTE — Plan of Care (Signed)

## 2022-03-25 NOTE — Plan of Care (Signed)
  Problem: Education: ?Goal: Knowledge of General Education information will improve ?Description: Including pain rating scale, medication(s)/side effects and non-pharmacologic comfort measures ?Outcome: Progressing ?  ?Problem: Health Behavior/Discharge Planning: ?Goal: Ability to manage health-related needs will improve ?Outcome: Progressing ?  ?Problem: Clinical Measurements: ?Goal: Ability to maintain clinical measurements within normal limits will improve ?Outcome: Progressing ?Goal: Will remain free from infection ?Outcome: Progressing ?Goal: Diagnostic test results will improve ?Outcome: Progressing ?Goal: Respiratory complications will improve ?Outcome: Progressing ?Goal: Cardiovascular complication will be avoided ?Outcome: Progressing ?  ?Problem: Activity: ?Goal: Risk for activity intolerance will decrease ?Outcome: Progressing ?  ?Problem: Nutrition: ?Goal: Adequate nutrition will be maintained ?Outcome: Progressing ?  ?Problem: Elimination: ?Goal: Will not experience complications related to bowel motility ?Outcome: Progressing ?Goal: Will not experience complications related to urinary retention ?Outcome: Progressing ?  ?Problem: Pain Managment: ?Goal: General experience of comfort will improve ?Outcome: Progressing ?  ?Problem: Safety: ?Goal: Ability to remain free from injury will improve ?Outcome: Progressing ?  ?Problem: Coping: ?Goal: Level of anxiety will decrease ?Outcome: Completed/Met ?  ?

## 2022-03-25 NOTE — Progress Notes (Signed)
Dressing changed and incision from surgery noted.  If evidence of infection.  Erythema in the anterior knee is less prominent with less swelling.  He is complaining of back pain and does have muscle relaxers and encouraged him to ask for that.

## 2022-03-25 NOTE — Progress Notes (Signed)
Secure chat with RN re PICC ordered for OPAT.  States for dc home possibly Monday.  Notified will plan PICC placement this pm.

## 2022-03-26 DIAGNOSIS — Z933 Colostomy status: Secondary | ICD-10-CM | POA: Diagnosis not present

## 2022-03-26 DIAGNOSIS — M8668 Other chronic osteomyelitis, other site: Secondary | ICD-10-CM | POA: Diagnosis not present

## 2022-03-26 DIAGNOSIS — M009 Pyogenic arthritis, unspecified: Secondary | ICD-10-CM

## 2022-03-26 DIAGNOSIS — M00061 Staphylococcal arthritis, right knee: Secondary | ICD-10-CM

## 2022-03-26 DIAGNOSIS — D509 Iron deficiency anemia, unspecified: Secondary | ICD-10-CM | POA: Diagnosis not present

## 2022-03-26 DIAGNOSIS — M8700 Idiopathic aseptic necrosis of unspecified bone: Secondary | ICD-10-CM | POA: Diagnosis not present

## 2022-03-26 DIAGNOSIS — M8708 Idiopathic aseptic necrosis of bone, other site: Secondary | ICD-10-CM

## 2022-03-26 LAB — CULTURE, BLOOD (ROUTINE X 2)
Culture: NO GROWTH
Culture: NO GROWTH

## 2022-03-26 LAB — CBC WITH DIFFERENTIAL/PLATELET
Abs Immature Granulocytes: 0.03 10*3/uL (ref 0.00–0.07)
Basophils Absolute: 0.1 10*3/uL (ref 0.0–0.1)
Basophils Relative: 1 %
Eosinophils Absolute: 0.2 10*3/uL (ref 0.0–0.5)
Eosinophils Relative: 4 %
HCT: 37.9 % — ABNORMAL LOW (ref 39.0–52.0)
Hemoglobin: 11.9 g/dL — ABNORMAL LOW (ref 13.0–17.0)
Immature Granulocytes: 1 %
Lymphocytes Relative: 34 %
Lymphs Abs: 2.1 10*3/uL (ref 0.7–4.0)
MCH: 27.2 pg (ref 26.0–34.0)
MCHC: 31.4 g/dL (ref 30.0–36.0)
MCV: 86.7 fL (ref 80.0–100.0)
Monocytes Absolute: 0.6 10*3/uL (ref 0.1–1.0)
Monocytes Relative: 9 %
Neutro Abs: 3.3 10*3/uL (ref 1.7–7.7)
Neutrophils Relative %: 51 %
Platelets: 365 10*3/uL (ref 150–400)
RBC: 4.37 MIL/uL (ref 4.22–5.81)
RDW: 15.2 % (ref 11.5–15.5)
WBC: 6.3 10*3/uL (ref 4.0–10.5)
nRBC: 0 % (ref 0.0–0.2)

## 2022-03-26 LAB — BASIC METABOLIC PANEL
Anion gap: 7 (ref 5–15)
BUN: 14 mg/dL (ref 6–20)
CO2: 29 mmol/L (ref 22–32)
Calcium: 9.5 mg/dL (ref 8.9–10.3)
Chloride: 102 mmol/L (ref 98–111)
Creatinine, Ser: 0.52 mg/dL — ABNORMAL LOW (ref 0.61–1.24)
GFR, Estimated: >60 mL/min (ref >60)
Glucose, Bld: 102 mg/dL — ABNORMAL HIGH (ref 70–99)
Potassium: 4.1 mmol/L (ref 3.5–5.1)
Sodium: 138 mmol/L (ref 135–145)

## 2022-03-26 MED ORDER — NYSTATIN 100000 UNIT/GM EX POWD: Freq: Three times a day (TID) | CUTANEOUS | Status: AC | PRN

## 2022-03-26 MED ORDER — CEFAZOLIN IV (FOR PTA / DISCHARGE USE ONLY): 3.0000 g | Freq: Three times a day (TID) | INTRAVENOUS | 0 refills | Status: AC

## 2022-03-26 MED ORDER — SODIUM CHLORIDE 0.9% FLUSH
10.0000 mL | Freq: Two times a day (BID) | INTRAVENOUS | Status: DC
  Administered 2022-03-26: 10 mL

## 2022-03-26 MED ORDER — CHLORHEXIDINE GLUCONATE CLOTH 2 % EX PADS
6.0000 | MEDICATED_PAD | Freq: Every day | CUTANEOUS | Status: DC
  Administered 2022-03-26: 6 via TOPICAL

## 2022-03-26 MED ORDER — ASCORBIC ACID 500 MG PO TABS: 1000.0000 mg | ORAL_TABLET | Freq: Every evening | ORAL | Status: AC

## 2022-03-26 MED ORDER — FERROUS SULFATE 325 (65 FE) MG PO TABS: 325.0000 mg | ORAL_TABLET | ORAL | 0 refills | Status: DC

## 2022-03-26 MED ORDER — SODIUM CHLORIDE 0.9% FLUSH: 10.0000 mL | INTRAVENOUS | Status: DC | PRN

## 2022-03-26 MED ORDER — CEFAZOLIN IN SODIUM CHLORIDE 3-0.9 GM/100ML-% IV SOLN
3.0000 g | Freq: Three times a day (TID) | INTRAVENOUS | Status: DC
  Administered 2022-03-26: 3 g via INTRAVENOUS
  Filled 2022-03-26 (×2): qty 100

## 2022-03-26 MED ORDER — ROPINIROLE HCL 0.25 MG PO TABS
0.2500 mg | ORAL_TABLET | Freq: Every day | ORAL | 0 refills | Status: AC
Start: 2022-03-26 — End: ?

## 2022-03-26 NOTE — Plan of Care (Signed)
  Problem: Education: Goal: Knowledge of General Education information will improve Description: Including pain rating scale, medication(s)/side effects and non-pharmacologic comfort measures Outcome: Progressing   Problem: Health Behavior/Discharge Planning: Goal: Ability to manage health-related needs will improve Outcome: Progressing   Problem: Clinical Measurements: Goal: Ability to maintain clinical measurements within normal limits will improve Outcome: Progressing   Problem: Clinical Measurements: Goal: Will remain free from infection Outcome: Progressing   Problem: Clinical Measurements: Goal: Diagnostic test results will improve Outcome: Progressing   Problem: Clinical Measurements: Goal: Respiratory complications will improve Outcome: Progressing   Problem: Clinical Measurements: Goal: Cardiovascular complication will be avoided Outcome: Progressing   Problem: Activity: Goal: Risk for activity intolerance will decrease Outcome: Progressing   Problem: Nutrition: Goal: Adequate nutrition will be maintained Outcome: Progressing   Problem: Elimination: Goal: Will not experience complications related to bowel motility Outcome: Progressing   Problem: Elimination: Goal: Will not experience complications related to urinary retention Outcome: Progressing   Problem: Pain Managment: Goal: General experience of comfort will improve Outcome: Progressing   Problem: Safety: Goal: Ability to remain free from injury will improve Outcome: Progressing   Problem: Skin Integrity: Goal: Risk for impaired skin integrity will decrease Outcome: Progressing

## 2022-03-26 NOTE — Discharge Summary (Signed)
Physician Discharge Summary   Patient: Craig [name] MRN: [id number] DOB: [birth year]  Admit date:     03/21/2022  Discharge date: 03/26/22  Discharge Physician: Jennye Boroughs   PCP: [facility]   Recommendations at discharge:  {Tip this will not be part of the note when signed- Example include specific recommendations for outpatient follow-up, pending tests to follow-up on. (Optional):26781}  ***  Discharge Diagnoses: Principal Problem:   Infection of right knee ([place]) Active Problems:   Obesity (BMI 30-39.9)   Hypertension   Gout   Colostomy status (HCC)   Nicotine dependence   DVT (deep venous thrombosis) (HCC)   Avascular necrosis of bone (HCC), right knee   Iron deficiency anemia   Restless legs syndrome  Resolved Problems:   * No resolved hospital problems. [facility] Course: No notes on file  Assessment and Plan: * Infection of right knee ([place]) Patient with a history of MSSA right knee septic arthritis status post completion of IV antibiotic therapy who presents to the ER for evaluation of worsening right knee pain, swelling and redness. Had an MRI of the right knee 07/21 which showed extensive bone infarct involving the proximal tibial metaphysis extending to the articular surface both medially and laterally and into the diaphysis. Small amount of fluid tibial infarct which communicates with the acute particular surface of the lateral tibial plateau. Mild heterogeneous edema within the tibial bone infarct. An element of underlying chronic osteomyelitis cannot be excluded. Extensive bone infarct involving the distal femoral metaphysis extending to the epiphysis. Small bone infarct in the proximal fibula and patella.Extensive tricompartmental full-thickness cartilage loss consistent with advanced osteoarthritis. Maceration of the medial and lateral menisci. Right knee x-ray shows severe erosive changes of the knee, significantly progressed since  the prior radiograph and likely related to chronic osteomyelitis.  Orthopedic surgery consult for aspiration and bone biopsy Hold off on antibiotic therapy until procedure is done and culture results become available per ID recommendation Pain control  Obesity (BMI 30-39.9) Patient has a BMI of 63.01 kg/m2 with complications of obstructive sleep apnea on CPAP at bedtime Complicates overall prognosis and care Lifestyle modification and exercise has been discussed with patient in detail  Hypertension Patient is normotensive Hold lisinopril, furosemide and metoprolol for now  Gout Stable Continue colchicine  Nicotine dependence Smoking cessation has been discussed with patient Place patient on nicotine transdermal patch 21 mg daily  Colostomy status ([place]) Will require colostomy care during this hospitalization  DVT (deep venous thrombosis) ([place]) Patient has a 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 , ,  Active Pressure Injury/Wound(s)     Pressure Ulcer  Duration          Pressure Injury 07/08/21 Scrotum Bilateral Stage 1 -  Intact skin with non-blanchable redness of a localized area usually over a bony prominence. red nonblanchable on sacrum, above sacrum and scrotal sack 261 days           (Optional):26781}  {(NOTE) Pain control PDMP Statment (Optional):26782} Consultants: *** Procedures performed: ***  Disposition: {Plan; Disposition:26390} Diet recommendation:  Discharge Diet Orders (From admission, onward)     Start     Ordered   03/26/22 0000  Diet - low sodium heart healthy        03/26/22 1536           {  Diet_Plan:26776} DISCHARGE MEDICATION: Allergies as of 03/26/2022       Reactions   Fentanyl Citrate Other (See Comments)   Makes patient more agitated & delirious   Tramadol Hcl    SEIZURES!!! DO NOT REORDER  AT DISCHARGE!!!   Amoxicillin Rash   Tolerated cefepime and cefazolin 08/2020. Tolerated Zosyn 07/2021        Medication List     STOP taking these medications    cephALEXin 500 MG capsule Commonly known as: KEFLEX   doxazosin 1 MG tablet Commonly known as: CARDURA   furosemide 40 MG tablet Commonly known as: LASIX   HYDROcodone-acetaminophen 7.5-325 MG tablet Commonly known as: NORCO   lisinopril 5 MG tablet Commonly known as: ZESTRIL   meloxicam 7.5 MG tablet Commonly known as: MOBIC       TAKE these medications    acetaminophen 650 MG CR tablet Commonly known as: TYLENOL Take 650 mg by mouth every 8 (eight) hours as needed.   albuterol 1.25 MG/3ML nebulizer solution Commonly known as: ACCUNEB Take by nebulization.   Allopurinol 200 MG Tabs Take 400 mg by mouth daily. What changed: Another medication with the same name was removed. Continue taking this medication, and follow the directions you see here.   ascorbic acid 500 MG tablet Commonly known as: VITAMIN C Take 2 tablets (1,000 mg total) by mouth at bedtime.   blood glucose meter kit and supplies Kit Dispense based on patient and insurance preference. Use up to four times daily as directed.   budesonide 0.25 MG/2ML nebulizer solution Commonly known as: PULMICORT Take 0.25 mg by nebulization 2 (two) times daily.   ceFAZolin  IVPB Commonly known as: ANCEF Inject 3 g into the vein every 8 (eight) hours. Indication:  MSSA knee septic arthritis First Dose: Yes Last Day of Therapy:  9/14/223 Labs - Once weekly:  CBC/D and CMP, ESR and CRP Please pull PIC at completion of IV antibiotics Fax weekly lab results  promptly to [phone] Method of administration: Mini bag plus Method of administration may be changed at the discretion of home infusion pharmacist based upon assessment of the patient and/or caregiver's ability to self-administer the medication ordered.   cetirizine 10 MG  tablet Commonly known as: ZYRTEC Take 10 mg by mouth daily.   clonazePAM 1 MG tablet Commonly known as: KLONOPIN Take 1 mg by mouth 2 (two) times daily as needed.   clotrimazole 1 % cream Commonly known as: LOTRIMIN Apply topically 2 (two) times daily.   colchicine 0.6 MG tablet Take 0.6 mg by mouth daily.   [employer] Saline Nasal Spray 0.65 % nasal spray Generic drug: sodium chloride SMARTSIG:1 Spray(s) Both Nares Every 4 Hours PRN   cyanocobalamin 1000 MCG tablet Commonly known as: VITAMIN B12 Take 1,000 mcg by mouth daily.   diclofenac Sodium 1 % Gel Commonly known as: VOLTAREN Apply 1 Application topically 4 (four) times daily.   docusate sodium 100 MG capsule Commonly known as: COLACE Take 1 capsule (100 mg total) by mouth 2 (two) times daily as needed for mild constipation.   DULoxetine 60 MG capsule Commonly known as: CYMBALTA Take 60 mg by mouth daily.   famotidine 20 MG tablet Commonly known as: PEPCID Take 20 mg by mouth 2 (two) times daily as needed.   ferrous sulfate 325 (65 FE) MG tablet Take 1 tablet (325 mg total) by mouth every other day. Start taking on: March 29, 3535   folic acid 1 MG tablet Commonly known as:  FOLVITE Take 1 mg by mouth daily.   hydroxychloroquine 200 MG tablet Commonly known as: PLAQUENIL Take 200 mg by mouth 2 (two) times daily.   lidocaine 5 % Commonly known as: LIDODERM Place onto the skin.   magnesium oxide 400 (240 Mg) MG tablet Commonly known as: MAG-OX Take 1 tablet (400 mg total) by mouth 2 (two) times daily.   melatonin 3 MG Tabs tablet Take 6 mg by mouth at bedtime as needed.   metoprolol succinate 50 MG 24 hr tablet Commonly known as: TOPROL-XL Take 3 tablets (150 mg total) by mouth daily. Take with or immediately following a meal.   multivitamin with minerals tablet Take 1 tablet by mouth daily.   nicotine 21 mg/24hr patch Commonly known as: NICODERM CQ - dosed in mg/24 hours Place 1 patch (21 mg total)  onto the skin daily. What changed:  when to take this reasons to take this additional instructions   nystatin powder Commonly known as: MYCOSTATIN/NYSTOP Apply topically 3 (three) times daily as needed.   omeprazole 20 MG capsule Commonly known as: PRILOSEC Take 1 capsule (20 mg total) by mouth once daily   ondansetron 4 MG tablet Commonly known as: ZOFRAN Take 4 mg by mouth every 8 (eight) hours as needed.   oxyCODONE 5 MG immediate release tablet Commonly known as: Oxy IR/ROXICODONE Take 5 mg by mouth every 4 (four) hours as needed.   polyethylene glycol 17 g packet Commonly known as: MIRALAX / GLYCOLAX Take 17 g by mouth daily.   potassium chloride SA 20 MEQ tablet Commonly known as: KLOR-CON M Take 1 tablet (20 mEq total) by mouth 2 (two) times daily.   pregabalin 200 MG capsule Commonly known as: LYRICA Take 200 mg by mouth 2 (two) times daily.   rivaroxaban 20 MG Tabs tablet Commonly known as: XARELTO Take 20 mg by mouth daily with supper.   rOPINIRole 0.25 MG tablet Commonly known as: REQUIP Take 1 tablet (0.25 mg total) by mouth at bedtime.   Vitamin D3 25 MCG (1000 UT) Caps Take 1 capsule by mouth daily.   zinc sulfate 220 (50 Zn) MG capsule Take 220 mg by mouth daily.               Discharge Care Instructions  (From admission, onward)           Start     Ordered   03/26/22 0000  Change dressing on IV access line weekly and PRN  (Home infusion instructions - [facility] )        03/26/22 1532   03/26/22 0000  Discharge wound care:       Comments: Keep wound on buttocks clean and dry.  Avoid laying or sitting on your buttocks for too long   03/26/22 1536            Discharge Exam: Filed Weights   03/21/22 0032 03/22/22 0928  Weight: 127 kg 127 kg   ***  Condition at discharge: {DC Condition:26389}  The results of significant diagnostics from this hospitalization (including imaging, microbiology, ancillary and  laboratory) are listed below for reference.   Imaging Studies: [place] EKG SITE RITE  Result Date: 03/25/2022 If Site Rite image not attached, placement could not be confirmed due to current cardiac rhythm.  DG Knee 1-2 Views Right  Result Date: 03/22/2022 CLINICAL DATA:  Bone biopsy EXAM: RIGHT KNEE - 1 VIEW COMPARISON:  Radiograph dated March 21, 2022 FINDINGS: Fluoroscopic images were obtained intraoperatively and  submitted for post operative interpretation. Single image demonstrates bone biopsy with 34 seconds of fluoroscopy time and 2.24 mGy. Please see the performing provider's procedural report for further detail. IMPRESSION: Fluoroscopic images demonstrate bone biopsy. Electronically Signed   By: Yetta Glassman M.D.   On: 03/22/2022 11:02   DG C-Arm 1-60 Min-No Report  Result Date: 03/22/2022 Fluoroscopy was utilized by the requesting physician.  No radiographic interpretation.   DG Knee Complete 4 Views Right  Result Date: 03/21/2022 CLINICAL DATA:  Right knee pain. EXAM: RIGHT KNEE - COMPLETE 4+ VIEW COMPARISON:  Right knee radiograph dated 08/28/2021. FINDINGS: There is no acute fracture or dislocation. There is advanced osteopenia. Severe erosive changes of the knee, significantly progressed since the prior radiograph and likely related to chronic osteomyelitis. Correlation with clinical exam is recommended to evaluate for possibility of acute septic joint. Sclerotic changes of the knee in keeping with bone infarcts. No suprapatellar effusion. The soft tissues are grossly unremarkable. IMPRESSION: 1. No acute fracture or dislocation. 2. Severe erosive changes of the knee, significantly progressed since the prior radiograph and likely related to chronic osteomyelitis. Clinical correlation is recommended to evaluate for septic joint. Electronically Signed   By: Anner Crete M.D.   On: 03/21/2022 01:19   MR KNEE RIGHT WO CONTRAST  Result Date: 03/12/2022 CLINICAL DATA:  Knee pain.  History  of infection 1 year ago. EXAM: MRI OF THE RIGHT KNEE WITHOUT CONTRAST TECHNIQUE: Multiplanar, multisequence MR imaging of the knee was performed. No intravenous contrast was administered. COMPARISON:  None Available. FINDINGS: MENISCI Medial: Maceration of the medial meniscus. Lateral: Maceration of the lateral meniscus. LIGAMENTS Cruciates: ACL and PCL are intact. Collaterals: Medial collateral ligament is intact. Lateral collateral ligament complex is intact. CARTILAGE Patellofemoral: Extensive full-thickness cartilage loss of the patellofemoral compartment. Medial: Extensive full-thickness cartilage loss of the medial femorotibial compartment. Lateral: Extensive full-thickness cartilage loss of the lateral femorotibial compartment. JOINT: No joint effusion. Normal Hoffa's fat-pad. No plical thickening. POPLITEAL FOSSA: Popliteus tendon is intact. No Baker's cyst. EXTENSOR MECHANISM: Intact quadriceps tendon. Intact patellar tendon. Intact lateral patellar retinaculum. Intact medial patellar retinaculum. Intact MPFL. BONES: Extensive bone infarcts involving the distal femoral metaphysis extending to the epiphysis. Extensive bone infarct involving the proximal tibial metaphysis extending to the articular surface both medially and laterally and into the diaphysis. Small amount of fluid tibial infarct which communicates with the acute particular surface of the lateral tibial plateau. Mild heterogeneous edema within the tibial bone infarct. Small bone infarct in the proximal fibula and patella. Other: No fluid collection or hematoma. Muscles are normal. IMPRESSION: 1. Extensive bone infarct involving the proximal tibial metaphysis extending to the articular surface both medially and laterally and into the diaphysis. Small amount of fluid tibial infarct which communicates with the acute particular surface of the lateral tibial plateau. Mild heterogeneous edema within the tibial bone infarct. An element of underlying  chronic osteomyelitis cannot be excluded. 2. Extensive bone infarct involving the distal femoral metaphysis extending to the epiphysis. Small bone infarct in the proximal fibula and patella. 3. Extensive tricompartmental full-thickness cartilage loss consistent with advanced osteoarthritis. 4. Maceration of the medial and lateral menisci. Electronically Signed   By: Kathreen Devoid M.D.   On: 03/12/2022 06:41    Microbiology: Results for orders placed or performed during the hospital encounter of 03/21/22  Blood Culture (routine x 2)     Status: None   Collection Time: 03/21/22  5:15 AM   Specimen: BLOOD  Result Value  Ref Range Status   Specimen Description BLOOD LEFT FA  Final   Special Requests   Final    BOTTLES DRAWN AEROBIC AND ANAEROBIC Blood Culture results may not be optimal due to an inadequate volume of blood received in culture bottles   Culture   Final    NO GROWTH 5 DAYS Performed at [facility], [address] [place], [place] [address]    Report Status 03/26/2022 FINAL  Final  Blood Culture (routine x 2)     Status: None   Collection Time: 03/21/22  5:15 AM   Specimen: BLOOD  Result Value Ref Range Status   Specimen Description BLOOD RIGHT AC  Final   Special Requests   Final    BOTTLES DRAWN AEROBIC AND ANAEROBIC Blood Culture results may not be optimal due to an inadequate volume of blood received in culture bottles   Culture   Final    NO GROWTH 5 DAYS Performed at [facility], [address], [place], [place] [address]    Report Status 03/26/2022 FINAL  Final  Aerobic/Anaerobic Culture w Gram Stain (surgical/deep wound)     Status: None (Preliminary result)   Collection Time: 03/22/22 10:30 AM   Specimen: PATH Bone biopsy; Tissue  Result Value Ref Range Status   Specimen Description   Final    BONE BIOPSY 1 Performed at [facility], [address] [place], [place] [address]    Special Requests   Final    NONE Performed at  [facility], [place], [place] [address]    Gram Stain NO WBC SEEN NO ORGANISMS SEEN   Final   Culture   Final    NO GROWTH 4 DAYS NO ANAEROBES ISOLATED; CULTURE IN PROGRESS FOR 5 DAYS Performed at [facility] [address] [place], [place] [address]    Report Status PENDING  Incomplete  Aerobic/Anaerobic Culture w Gram Stain (surgical/deep wound)     Status: None (Preliminary result)   Collection Time: 03/22/22 10:30 AM   Specimen: PATH Other; Body Fluid  Result Value Ref Range Status   Specimen Description BONE RIGHT TIBIA BIOPSY  Final   Special Requests NONE  Final   Gram Stain NO WBC SEEN FEW GRAM POSITIVE COCCI   Final   Culture   Final    FEW STAPHYLOCOCCUS AUREUS NO ANAEROBES ISOLATED; CULTURE IN PROGRESS FOR 5 DAYS    Report Status PENDING  Incomplete   Organism ID, Bacteria STAPHYLOCOCCUS AUREUS  Final      Susceptibility   Staphylococcus aureus - MIC*    CIPROFLOXACIN <=0.5 SENSITIVE Sensitive     ERYTHROMYCIN >=8 RESISTANT Resistant     GENTAMICIN <=0.5 SENSITIVE Sensitive     OXACILLIN 0.5 SENSITIVE Sensitive     TETRACYCLINE <=1 SENSITIVE Sensitive     VANCOMYCIN <=0.5 SENSITIVE Sensitive     TRIMETH/SULFA <=10 SENSITIVE Sensitive     CLINDAMYCIN RESISTANT Resistant     RIFAMPIN <=0.5 SENSITIVE Sensitive     Inducible Clindamycin Value in next row Resistant      POSITIVEPerformed at [facility], [address], [place], [place] [address]    * FEW STAPHYLOCOCCUS AUREUS  Aerobic/Anaerobic Culture w Gram Stain (surgical/deep wound)     Status: None (Preliminary result)   Collection Time: 03/22/22 10:31 AM   Specimen: PATH Other; Body Fluid  Result Value Ref Range Status   Specimen Description   Final    BONE RIGHT FEMUR  BIOPSY 2 Performed at [facility], [address], [place], [place] [address]    Special Requests   Final    NONE Performed at [facility], [place], [place]  [address]    Gram Stain NO WBC SEEN NO ORGANISMS SEEN   Final   Culture   Final    NO GROWTH 4 DAYS NO ANAEROBES ISOLATED; CULTURE IN PROGRESS FOR 5 DAYS Performed at [facility] [address], [place], [place] [address]    Report Status PENDING  Incomplete  Aerobic/Anaerobic Culture w Gram Stain (surgical/deep wound)     Status: None (Preliminary result)   Collection Time: 03/22/22 10:46 AM   Specimen: PATH Other; Body Fluid  Result Value Ref Range Status   Specimen Description   Final    BONE RIGHT TIBIA BIOPSY 2 Performed at [facility], [address], [place], [place] [address]    Special Requests   Final    NONE Performed at [facility], [address] [place], [place] [address]    Gram Stain   Final    NO WBC SEEN RARE GRAM POSITIVE COCCI Performed at [facility], [place] [address] [place],  [address]    Culture   Final    RARE STAPHYLOCOCCUS AUREUS NO ANAEROBES ISOLATED; CULTURE IN PROGRESS FOR 5 DAYS    Report Status PENDING  Incomplete   Organism ID, Bacteria STAPHYLOCOCCUS AUREUS  Final      Susceptibility   Staphylococcus aureus - MIC*    CIPROFLOXACIN <=0.5 SENSITIVE Sensitive     ERYTHROMYCIN >=8 RESISTANT Resistant     GENTAMICIN <=0.5 SENSITIVE Sensitive     OXACILLIN <=0.25 SENSITIVE Sensitive     TETRACYCLINE <=1 SENSITIVE Sensitive     VANCOMYCIN <=0.5 SENSITIVE Sensitive     TRIMETH/SULFA <=10 SENSITIVE Sensitive     CLINDAMYCIN RESISTANT Resistant     RIFAMPIN <=0.5 SENSITIVE Sensitive     Inducible Clindamycin POSITIVE Resistant     * RARE STAPHYLOCOCCUS AUREUS    Labs: CBC: Recent Labs  Lab 03/21/22 0034 03/22/22 0209 03/26/22 0423  WBC 9.2 8.7 6.3  NEUTROABS 6.1  --  3.3  HGB 13.5 13.0 11.9*  HCT 41.7 40.9 37.9*  MCV 87.1 87.2 86.7  PLT 246 270 269   Basic Metabolic Panel: Recent Labs  Lab 03/21/22 0034 03/22/22 0209 03/26/22 0423  NA 136 137 138  K 3.5 4.6 4.1  CL 95* 102 102  CO2  '27 29 29  ' GLUCOSE 143* 170* 102*  BUN '6 10 14  ' CREATININE 0.65 0.65 0.52*  CALCIUM 9.4 9.5 9.5  MG  --  2.2  --    Liver Function Tests: No results for input(s): "AST", "ALT", "ALKPHOS", "BILITOT", "PROT", "ALBUMIN" in the last 168 hours. CBG: Recent Labs  Lab 03/21/22 1708 03/21/22 2121  GLUCAP 155* 159*    Discharge time spent: {LESS THAN/GREATER THAN:26388} 30 minutes.  Signed: Jennye Boroughs, MD [facility] 03/26/2022

## 2022-03-26 NOTE — Progress Notes (Signed)
Staph aureus grew from tibial bone biopsy, negative femur culture. Would like him to get upright if possible with PT.

## 2022-03-26 NOTE — Progress Notes (Signed)
Peripherally Inserted Central Catheter Placement  The IV Nurse has discussed with the patient and/or persons authorized to consent for the patient, the purpose of this procedure and the potential benefits and risks involved with this procedure.  The benefits include less needle sticks, lab draws from the catheter, and the patient may be discharged home with the catheter. Risks include, but not limited to, infection, bleeding, blood clot (thrombus formation), and puncture of an artery; nerve damage and irregular heartbeat and possibility to perform a PICC exchange if needed/ordered by physician.  Alternatives to this procedure were also discussed.  Bard Power PICC patient education guide, fact sheet on infection prevention and patient information card has been provided to patient /or left at bedside.    PICC Placement Documentation  PICC Single Lumen 40/97/35 Right Cephalic 43 cm 0 cm (Active)  Indication for Insertion or Continuance of Line Home intravenous therapies (PICC only) 03/26/22 1107  Exposed Catheter (cm) 0 cm 03/26/22 1107  Site Assessment Clean, Dry, Intact 03/26/22 1107  Line Status Blood return noted;Saline locked;Flushed 03/26/22 1107  Dressing Type Transparent;Securing device 03/26/22 1107  Dressing Status Antimicrobial disc in place 03/26/22 1107  Dressing Intervention New dressing;Other (Comment) 03/26/22 1107  Dressing Change Due 04/02/22 03/26/22 1107       Christella Noa Albarece 03/26/2022, 11:08 AM

## 2022-03-26 NOTE — Progress Notes (Signed)
PT Cancellation Note  Patient Details Name: Craig [name] MRN: [id number] DOB: 21-Jul-1991   Cancelled Treatment:    Reason Eval/Treat Not Completed: Other (comment).  Chart reviewed and attempted to see pt.  Pt politely declined therapy at this time due to the back pain and the R knee pain he is experiencing.  Pt educated on the importance of mobility/exercise and understands MD is requesting pt mobility at this time, but still defers.  Pt to continue to be seen at later date/time as medically appropriate.   Gwenlyn Saran, PT, DPT 03/26/22, 3:21 PM

## 2022-03-26 NOTE — Progress Notes (Signed)
PHARMACY CONSULT NOTE FOR:  OUTPATIENT  PARENTERAL ANTIBIOTIC THERAPY (OPAT)  Indication: MSSA septic arthritis of knee  Regimen: Cefazolin 3gm IV q8h End date: 05/03/2022  Labs - Once weekly:  CBC/D and CMP, ESR and CRP Please pull PIC at completion of IV antibiotics Fax weekly lab results  promptly to [phone]  IV antibiotic discharge orders are pended. To discharging provider:  please sign these orders via discharge navigator,  Select New Orders & click on the button choice - Manage This Unsigned Work.     Thank you for allowing pharmacy to be a part of this patient's care.  Doreene Eland, PharmD, BCPS, BCIDP Work Cell: [phone] 03/26/2022 2:57 PM

## 2022-03-26 NOTE — Treatment Plan (Signed)
Diagnosis: MSSA rt knee septic arthritis Baseline Creatinine <1    Allergies  Allergen Reactions   Fentanyl Citrate Other (See Comments)    Makes patient more agitated & delirious   Tramadol Hcl     SEIZURES!!! DO NOT REORDER AT DISCHARGE!!!   Amoxicillin Rash    Tolerated cefepime and cefazolin 08/2020. Tolerated Zosyn 07/2021    OPAT Orders Discharge antibiotics: Cefazolin 3 grams IV every 8 hours as infusion Duration: 6weeks End Date:05/03/22   PIC Care Per Protocol:  Labs weekly while on IV antibiotics: X__ CBC with differential __ BMP _X_ CMP _X_ CRP _X_ ESR   __X Please pull PIC at completion of IV antibiotics   Fax weekly lab results  promptly to [phone]  Clinic Follow Up Appt:04/17/22 at 9.30 am   Call [phone] with any questions

## 2022-03-26 NOTE — Plan of Care (Signed)
Patient discharged per MD orders at this time.All discharge instructions,education and medications reviewed with the patient.Pt expressed understanding and will comply with dc instructions.follow up appointments was also communicated to the patient.no verbal c/o or any ssx of distress.Pt was discharged home with [facility] nursing infusion services per order.Pt was transported by 2 [facility] personnel on a stretcher.

## 2022-03-26 NOTE — TOC Progression Note (Addendum)
Transition of Care [facility]) - Progression Note    Patient Details  Name: Craig [name] MRN: [id number] Date of Birth: 31-Aug-1990  Transition of Care [facility]) CM/SW Contact  Laurena Slimmer, RN Phone Number: 03/26/2022, 1:25 PM  Clinical Narrative:    Referral sent and declined by Theresia [facility] of [facility].   Spoke with Pam from [employer] to advised patient has PICC line and will require IV antibiotic infusions. Pam will assist with arranging [place] via [place] or [employer] [place] with patient's wife, Craig [name]. She stated patient is active with [facility] and left contact information for his Randall, [email or web address] [phone]. Attempt to contact [place]. Left a message.   2:18pm  Retrieved a message from [facility]. Patient is no longer active with services and would not be accepted for [facility] care at this time. Patient wife informed and advised Pam from [employer] would be able to assist.         Expected Discharge Plan and Services                                                 Social Determinants of Health (SDOH) Interventions    Readmission Risk Interventions    08/30/2021    2:43 PM 07/10/2021    4:28 PM  Readmission Risk Prevention Plan  Transportation Screening Complete Complete  PCP or Specialist Appt within 3-5 Days  Complete  HRI or [place]  Complete  Social Work Consult for [place] Planning/Counseling  Complete  Palliative Care Screening  Not Applicable  Medication Review Press photographer) Complete Complete  PCP or Specialist appointment within 3-5 days of discharge Complete   HRI or [place] Complete   SW Recovery Care/Counseling Consult Complete   [place] Not Applicable

## 2022-03-26 NOTE — Consult Note (Signed)
NAME: Craig [name]  DOB: 09-24-1990  MRN: [id number]  Date/Time: 03/26/2022 8:43 AM  REQUESTING PROVIDER: Clois Comber Subjective:  REASON FOR CONSULT: rt knee septic arthritis ? Craig [name] is a 31 y.o. male well known to me from multiple hospitalizations, perforated diverticulitis, colostomy, bowel resection, COVID, hypoxic resp failure, tracheostomy, PEG, ICU neuropathy, OA Complicated medical history, was admitted for bone biopsy rt femur/tibia- pt has been treated for rt  knee septic arthritis due to MSSA for the past 8 months with recurrent prolonged courses( 3 months X 2 courses)  of appropriate antibiotics. Once he completes antibiotic the knee will flare up again He now has stiffness, and severe pain No redness . Minimal swelling. No fever HE underwent biopsy and culture of bone positive for MSSA and I am seeing the patient  Past medical history Diverticulitis/colon perforation in April 2021 and underwent emergent partial colectomy and end colostomy. November 2022 admitted for elective colostomy takedown.  Initially it was robotic lap but later converted to laparotomy.  Needed small bowel resection and parastomal hernia repair on 06/28/2020 and was discharged on 07/03/2020.  Readmitted for on 07/08/2020 for sepsis and pneumoperitoneum and underwent partial colectomy and colostomy on 07/08/2020. December 2021  hospitalized for COVID  and intubated and had to have a tracheostomy.  At that time had Staph aureus bacteremia and had cefazolin for 4 weeks.  He also had PE and MI during that hospitalization.  He was later transferred to [place] on 09/24/2020 with tracheostomy and PEG.  Finally he was discharged home.  He was bedbound for a long time.  He had a diagnosis of ICU neuropathy and had pain in his joints.  He was followed by a rheumatologist and orthopedics. On 07/08/2021 until 07/25/2021 he was hospitalized for MSSA bacteremia with MSSA septic arthritis of the right knee.   Underwent debridement and washout.  He initially received 4 weeks of antibiotics followed by oral antibiotics for another 2 months.  Once it was stopped it flared up again and then he went back on oral Keflex 1 g q. 6 for nearly 3 months. ESR improved 126>77.>64>37>   He completed the course late June.   Started to have pain in July and underwent an MRI on 03/09/2022.  That showed extensive bone infarct involving the proximal tibial metaphysis extending to the articular surface both medially and laterally and into the diaphysis.  Extensive bone infarct involving the distal femoral metaphysis extending to the epiphysis.  And extensive tricompartmental full-thickness cartilage loss and advanced osteoarthritis.  Past Medical History:  Diagnosis Date   Gout    Hypertension    Rupture of bowel [facility])     Past Surgical History:  Procedure Laterality Date   BONE BIOPSY Right 03/22/2022   Procedure: BONE BIOPSY OF TIBIA  FIBULA;  Surgeon: Hessie Knows, MD;  Location: [facility] ORS;  Service: Orthopedics;  Laterality: Right;   COLONOSCOPY WITH PROPOFOL N/A 05/18/2020   Procedure: COLONOSCOPY WITH PROPOFOL;  Surgeon: Benjamine Sprague, DO;  Location: [place] [facility];  Service: General;  Laterality: N/A;   COLOSTOMY N/A 11/22/2019   Procedure: COLOSTOMY;  Surgeon: Herbert Pun, MD;  Location: [facility] ORS;  Service: General;  Laterality: N/A;   KNEE ARTHROSCOPY Right 07/11/2021   Procedure: SYNOVIAL BIOPSY AND ARTHROSCOPIC IRRGATION AND DEBRIDEMENT OF SEPTIC KNEE;  Surgeon: Hessie Knows, MD;  Location: [facility] ORS;  Service: Orthopedics;  Laterality: Right;   KNEE ARTHROSCOPY Right 07/18/2021   Procedure: ARTHROSCOPY KNEE, IRRIGATION AND DEBRIDEMENT;  Surgeon: Hessie Knows, MD;  Location: [facility] ORS;  Service: Orthopedics;  Laterality: Right;   KNEE ARTHROSCOPY Right 09/01/2021   Procedure: ARTHROSCOPIC DEBRIDEMENT OF KNEE WITH SYNOVIUM BIOPSY;  Surgeon: Hessie Knows, MD;  Location: [facility];  Service: Orthopedics;   Laterality: Right;   LAPAROTOMY N/A 11/22/2019   Procedure: EXPLORATORY LAPAROTOMY;  Surgeon: Herbert Pun, MD;  Location: [facility];  Service: General;  Laterality: N/A;   LAPAROTOMY N/A 07/08/2020   Procedure: EXPLORATORY LAPAROTOMY;  Surgeon: Herbert Pun, MD;  Location: [facility];  Service: General;  Laterality: N/A;   OPEN REDUCTION INTERNAL FIXATION (ORIF) TIBIA/FIBULA FRACTURE Right 03/22/2022   Procedure: OPEN REDUCTION INTERNAL FIXATION (ORIF) TIBIA/FIBULA FRACTURE-BONE BIOPSY;  Surgeon: Hessie Knows, MD;  Location: [facility];  Service: Orthopedics;  Laterality: Right;  Femur/Tibia bone biopsy procedure   PARTIAL COLECTOMY N/A 11/22/2019   Procedure: PARTIAL COLECTOMY;  Surgeon: Herbert Pun, MD;  Location: [facility];  Service: General;  Laterality: N/A;   PEG PLACEMENT N/A 09/14/2020   Procedure: PERCUTANEOUS ENDOSCOPIC GASTROSTOMY (PEG) PLACEMENT;  Surgeon: Benjamine Sprague, DO;  Location: [place] [facility];  Service: General;  Laterality: N/A;  TRAVEL CASE TRACH COVID POSITIVE   SYNOVIAL BIOPSY Right 07/11/2021   Procedure: SYNOVIAL BIOPSY;  Surgeon: Hessie Knows, MD;  Location: [facility];  Service: Orthopedics;  Laterality: Right;   TRACHEOSTOMY TUBE PLACEMENT N/A 09/13/2020   Procedure: TRACHEOSTOMY;  Surgeon: Beverly Gust, MD;  Location: [facility];  Service: ENT;  Laterality: N/A;   XI ROBOTIC ASSISTED COLOSTOMY TAKEDOWN N/A 06/28/2020   Procedure: XI ROBOTIC ASSISTED COLOSTOMY TAKEDOWN CONVERTED TO OPEN PROCEDURE;  Surgeon: Herbert Pun, MD;  Location: [facility];  Service: General;  Laterality: N/A;    Social History   Socioeconomic History   Marital status: Married    Spouse name: Not on file   Number of children: Not on file   Years of education: Not on file   Highest education level: Not on file  Occupational History   Not on file  Tobacco Use   Smoking status: Never   Smokeless tobacco: Current    Types: Chew   Tobacco comments:    Not ready  to quit.   Vaping Use   Vaping Use: Never used  Substance and Sexual Activity   Alcohol use: Yes    Comment: pint to a fifth per day per patient . Patient reports he hasn't had anything to drink in three weeks.   Drug use: Never   Sexual activity: Yes    Birth control/protection: None  Other Topics Concern   Not on file  Social History Narrative   Not on file   Social Determinants of Health   Financial Resource Strain: Not on file  Food Insecurity: Not on file  Transportation Needs: Not on file  Physical Activity: Not on file  Stress: Not on file  Social Connections: Not on file  Intimate Partner Violence: Not on file    Family History  Problem Relation Age of Onset   Healthy Mother    Healthy Father    Allergies  Allergen Reactions   Fentanyl Citrate Other (See Comments)    Makes patient more agitated & delirious   Tramadol Hcl     SEIZURES!!! DO NOT REORDER AT DISCHARGE!!!   Amoxicillin Rash    Tolerated cefepime and cefazolin 08/2020. Tolerated Zosyn 07/2021   I? Current Facility-Administered Medications  Medication Dose Route Frequency Provider Last Rate Last Admin   acetaminophen (TYLENOL) tablet 650 mg  650 mg Oral Q6H PRN Hessie Knows, MD  650 mg at 03/21/22 1130   Or   acetaminophen (TYLENOL) suppository 650 mg  650 mg Rectal Q6H PRN Hessie Knows, MD       albuterol (PROVENTIL) (2.5 MG/3ML) 0.083% nebulizer solution 2.5 mg  2.5 mg Nebulization TID PRN Hessie Knows, MD   2.5 mg at 03/25/22 1932   allopurinol (ZYLOPRIM) tablet 400 mg  400 mg Oral Daily Hessie Knows, MD   400 mg at 03/25/22 9030   ascorbic acid (VITAMIN C) tablet 1,000 mg  1,000 mg Oral Daily Hessie Knows, MD   1,000 mg at 03/25/22 0948   bisacodyl (DULCOLAX) EC tablet 5 mg  5 mg Oral Daily PRN Hessie Knows, MD       budesonide (PULMICORT) nebulizer solution 0.25 mg  0.25 mg Nebulization BID Hessie Knows, MD   0.25 mg at 03/26/22 0748   ceFAZolin (ANCEF) IVPB 2g/100 mL premix  2 g  Intravenous Delfina Redwood, MD 200 mL/hr at 03/26/22 0509 2 g at 03/26/22 0509   cholecalciferol (VITAMIN D3) 25 MCG (1000 UNIT) tablet 1,000 Units  1,000 Units Oral Daily Hessie Knows, MD   1,000 Units at 03/25/22 0947   clonazePAM (KLONOPIN) tablet 1 mg  1 mg Oral BID PRN Hessie Knows, MD   1 mg at 03/22/22 1321   colchicine tablet 0.6 mg  0.6 mg Oral Daily Hessie Knows, MD   0.6 mg at 03/25/22 0923   cyanocobalamin (VITAMIN B12) tablet 1,000 mcg  1,000 mcg Oral Daily Hessie Knows, MD   1,000 mcg at 03/25/22 0947   diphenhydrAMINE (BENADRYL) capsule 25 mg  25 mg Oral Q8H PRN Jennye Boroughs, MD   25 mg at 03/22/22 2144   docusate sodium (COLACE) capsule 100 mg  100 mg Oral BID Hessie Knows, MD   100 mg at 03/24/22 2154   DULoxetine (CYMBALTA) DR capsule 60 mg  60 mg Oral Daily Hessie Knows, MD   60 mg at 03/25/22 0947   famotidine (PEPCID) tablet 20 mg  20 mg Oral BID Hessie Knows, MD   20 mg at 03/25/22 2224   ferrous sulfate tablet 325 mg  325 mg Oral Tonye Pearson, MD   325 mg at 30/07/62 2633   folic acid (FOLVITE) tablet 1 mg  1 mg Oral Daily Hessie Knows, MD   1 mg at 03/25/22 0948   HYDROcodone-acetaminophen (NORCO) 7.5-325 MG per tablet 1-2 tablet  1-2 tablet Oral Q4H PRN Hessie Knows, MD   2 tablet at 03/26/22 3545   HYDROcodone-acetaminophen (NORCO/VICODIN) 5-325 MG per tablet 1-2 tablet  1-2 tablet Oral Q4H PRN Hessie Knows, MD   2 tablet at 03/24/22 6256   hydroxychloroquine (PLAQUENIL) tablet 200 mg  200 mg Oral BID Hessie Knows, MD   200 mg at 03/25/22 2224   loratadine (CLARITIN) tablet 10 mg  10 mg Oral Daily Hessie Knows, MD   10 mg at 03/25/22 0947   magnesium hydroxide (MILK OF MAGNESIA) suspension 30 mL  30 mL Oral Daily PRN Hessie Knows, MD       magnesium oxide (MAG-OX) tablet 400 mg  400 mg Oral BID Hessie Knows, MD   400 mg at 03/25/22 2224   melatonin tablet 5 mg  5 mg Oral QHS PRN Hessie Knows, MD       methocarbamol (ROBAXIN) tablet 500 mg  500  mg Oral Q6H PRN Hessie Knows, MD   500 mg at 03/26/22 0500   Or   methocarbamol (ROBAXIN) 500 mg in dextrose 5 %  50 mL IVPB  500 mg Intravenous Q6H PRN Hessie Knows, MD       metoCLOPramide (REGLAN) tablet 5-10 mg  5-10 mg Oral Q8H PRN Hessie Knows, MD       Or   metoCLOPramide (REGLAN) injection 5-10 mg  5-10 mg Intravenous Q8H PRN Hessie Knows, MD       metoprolol succinate (TOPROL-XL) 24 hr tablet 150 mg  150 mg Oral Daily Hessie Knows, MD   150 mg at 03/25/22 0947   morphine (PF) 2 MG/ML injection 2 mg  2 mg Intravenous Q1H PRN Hessie Knows, MD   2 mg at 03/22/22 1702   multivitamin with minerals tablet 1 tablet  1 tablet Oral Daily Hessie Knows, MD   1 tablet at 03/25/22 0947   nicotine (NICODERM CQ - dosed in mg/24 hours) patch 21 mg  21 mg Transdermal Daily PRN Hessie Knows, MD       nystatin (MYCOSTATIN/NYSTOP) topical powder   Topical TID PRN Hessie Knows, MD       ondansetron [facility]) tablet 4 mg  4 mg Oral Q6H PRN Hessie Knows, MD   4 mg at 03/21/22 8413   Or   ondansetron (ZOFRAN) injection 4 mg  4 mg Intravenous Q6H PRN Hessie Knows, MD       polyethylene glycol (MIRALAX / Floria Raveling) packet 17 g  17 g Oral Daily Hessie Knows, MD       pregabalin (LYRICA) capsule 200 mg  200 mg Oral BID Hessie Knows, MD   200 mg at 03/25/22 2225   rivaroxaban (XARELTO) tablet 20 mg  20 mg Oral QPM Hessie Knows, MD   20 mg at 03/25/22 1714   senna-docusate (Senokot-S) tablet 1 tablet  1 tablet Oral QHS PRN Hessie Knows, MD       sodium chloride (OCEAN) 0.65 % nasal spray 2 spray  2 spray Each Nare PRN Hessie Knows, MD       sodium phosphate (FLEET) 7-19 GM/118ML enema 1 enema  1 enema Rectal Once PRN Hessie Knows, MD       traZODone (DESYREL) tablet 25 mg  25 mg Oral QHS PRN Hessie Knows, MD   25 mg at 03/21/22 2125   zinc sulfate capsule 220 mg  220 mg Oral Daily Hessie Knows, MD   220 mg at 03/25/22 2440     Abtx:  Anti-infectives (From admission, onward)    Start      Dose/Rate Route Frequency Ordered Stop   03/22/22 1400  ceFAZolin (ANCEF) IVPB 2g/100 mL premix        2 g 200 mL/hr over 30 Minutes Intravenous Every 8 hours 03/22/22 1238     03/22/22 1000  hydroxychloroquine (PLAQUENIL) tablet 200 mg        200 mg Oral 2 times daily 03/22/22 0806         REVIEW OF SYSTEMS:  Const: negative fever, negative chills,  weight loss Eyes: negative diplopia or visual changes, negative eye pain ENT: negative coryza, negative sore throat Resp: negative cough, hemoptysis, has dyspnea but better than before- uses BIPAP  Cards: negative for chest pain, palpitations, lower extremity edema GU: negative for frequency, dysuria and hematuria GI: Negative for abdominal pain, diarrhea, bleeding, constipation Skin: negative for rash and pruritus Heme: negative for easy bruising and gum/nose bleeding MS: as above Decreased mobility Neurolo:negative for headaches, dizziness, vertigo, memory problems  Psych:  anxiety, depression  Endocrine: negative for thyroid, diabetes Allergy/Immunology- negative for any medication or food allergies ?  P Objective:  VITALS:  BP 104/68 (BP Location: Left Arm)   Pulse 81   Temp 98 F (36.7 C) (Oral)   Resp 14   Ht _0  (1.854 m)   Wt 127 kg   SpO2 99%   BMI 36.94 kg/m   PHYSICAL EXAM:  General: Alert, cooperative, no distress, appears stated age.  Head: Normocephalic, without obvious abnormality, atraumatic. Eyes: Conjunctivae clear, anicteric sclerae. Pupils are equal ENT Nares normal. No drainage or sinus tenderness. Lips, mucosa, and tongue normal. No Thrush Neck: Supple, symmetrical, no adenopathy, thyroid: non tender no carotid bruit and no JVD. Back: No CVA tenderness. Lungs: b/l air entry Heart: Regular rate and rhythm, no murmur, rub or gallop. Abdomen: Soft, lap scar colostomy Extremities: wasting of lower extremities Swelling rt knee  Skin: No rashes or lesions. Or bruising Lymph: Cervical,  supraclavicular normal. Neurologic: weakness lower extremities Pertinent Labs Lab Results CBC    Component Value Date/Time   WBC 6.3 03/26/2022 0423   RBC 4.37 03/26/2022 0423   HGB 11.9 (L) 03/26/2022 0423   HCT 37.9 (L) 03/26/2022 0423   PLT 365 03/26/2022 0423   MCV 86.7 03/26/2022 0423   MCH 27.2 03/26/2022 0423   MCHC 31.4 03/26/2022 0423   RDW 15.2 03/26/2022 0423   LYMPHSABS 2.1 03/26/2022 0423   MONOABS 0.6 03/26/2022 0423   EOSABS 0.2 03/26/2022 0423   BASOSABS 0.1 03/26/2022 0423       Latest Ref Rng & Units 03/26/2022    4:23 AM 03/22/2022    2:09 AM 03/21/2022   12:34 AM  CMP  Glucose 70 - 99 mg/dL 102  170  143   BUN 6 - 20 mg/dL _1 Creatinine 0.61 - 1.24 mg/dL 0.52  0.65  0.65   Sodium 135 - 145 mmol/L 138  137  136   Potassium 3.5 - 5.1 mmol/L 4.1  4.6  3.5   Chloride 98 - 111 mmol/L 102  102  95   CO2 22 - 32 mmol/L _2 Calcium 8.9 - 10.3 mg/dL 9.5  9.5  9.4       Microbiology: Recent Results (from the past 240 hour(s))  Blood Culture (routine x 2)     Status: None   Collection Time: 03/21/22  5:15 AM   Specimen: BLOOD  Result Value Ref Range Status   Specimen Description BLOOD LEFT FA  Final   Special Requests   Final    BOTTLES DRAWN AEROBIC AND ANAEROBIC Blood Culture results may not be optimal due to an inadequate volume of blood received in culture bottles   Culture   Final    NO GROWTH 5 DAYS Performed at [facility], [address] [place], [place] [address]    Report Status 03/26/2022 FINAL  Final  Blood Culture (routine x 2)     Status: None   Collection Time: 03/21/22  5:15 AM   Specimen: BLOOD  Result Value Ref Range Status   Specimen Description BLOOD RIGHT AC  Final   Special Requests   Final    BOTTLES DRAWN AEROBIC AND ANAEROBIC Blood Culture results may not be optimal due to an inadequate volume of blood received in culture bottles   Culture   Final    NO GROWTH 5 DAYS Performed at [facility], [address], [place], [place] [address]    Report Status 03/26/2022 FINAL  Final  Aerobic/Anaerobic Culture w  Gram Stain (surgical/deep wound)     Status: None (Preliminary result)   Collection Time: 03/22/22 10:30 AM   Specimen: PATH Bone biopsy; Tissue  Result Value Ref Range Status   Specimen Description   Final    BONE BIOPSY 1 Performed at [facility], [address] [place], [place] [address]    Special Requests   Final    NONE Performed at [facility], [place], [place] [address]    Gram Stain NO WBC SEEN NO ORGANISMS SEEN   Final   Culture   Final    NO GROWTH 3 DAYS Performed at [facility], [place] [address], [place], [place] [address]    Report Status PENDING  Incomplete  Aerobic/Anaerobic Culture w Gram Stain (surgical/deep wound)     Status: None (Preliminary result)   Collection Time: 03/22/22 10:30 AM   Specimen: PATH Other; Body Fluid  Result Value Ref Range Status   Specimen Description BONE RIGHT TIBIA BIOPSY  Final   Special Requests NONE  Final   Gram Stain NO WBC SEEN FEW GRAM POSITIVE COCCI   Final   Culture   Final    FEW STAPHYLOCOCCUS AUREUS NO ANAEROBES ISOLATED; CULTURE IN PROGRESS FOR 5 DAYS    Report Status PENDING  Incomplete   Organism ID, Bacteria STAPHYLOCOCCUS AUREUS  Final      Susceptibility   Staphylococcus aureus - MIC*    CIPROFLOXACIN <=0.5 SENSITIVE Sensitive     ERYTHROMYCIN >=8 RESISTANT Resistant     GENTAMICIN <=0.5 SENSITIVE Sensitive     OXACILLIN 0.5 SENSITIVE Sensitive     TETRACYCLINE <=1 SENSITIVE Sensitive     VANCOMYCIN <=0.5 SENSITIVE Sensitive     TRIMETH/SULFA <=10 SENSITIVE Sensitive     CLINDAMYCIN RESISTANT Resistant     RIFAMPIN <=0.5 SENSITIVE Sensitive     Inducible Clindamycin Value in next row Resistant      POSITIVEPerformed at [facility], [address], [place], [place] [address]    * FEW STAPHYLOCOCCUS AUREUS  Aerobic/Anaerobic Culture w  Gram Stain (surgical/deep wound)     Status: None (Preliminary result)   Collection Time: 03/22/22 10:31 AM   Specimen: PATH Other; Body Fluid  Result Value Ref Range Status   Specimen Description   Final    BONE RIGHT FEMUR BIOPSY 2 Performed at [facility], [address] [place], [place] [address]    Special Requests   Final    NONE Performed at [facility], [place], [place] [address]    Gram Stain NO WBC SEEN NO ORGANISMS SEEN   Final   Culture   Final    NO GROWTH 3 DAYS Performed at [facility], [place] [address], [place], [place] [address]    Report Status PENDING  Incomplete  Aerobic/Anaerobic Culture w Gram Stain (surgical/deep wound)     Status: None (Preliminary result)   Collection Time: 03/22/22 10:46 AM   Specimen: PATH Other; Body Fluid  Result Value Ref Range Status   Specimen Description   Final    BONE RIGHT TIBIA BIOPSY 2 Performed at [facility], [address], [place], [place] [address]    Special Requests   Final    NONE Performed at [facility], [address] [place],  [address]    Gram Stain   Final    NO WBC SEEN RARE GRAM POSITIVE COCCI Performed at [facility], [address] [place], [place] [address]    Culture   Final    RARE STAPHYLOCOCCUS AUREUS NO ANAEROBES ISOLATED; CULTURE IN PROGRESS FOR 5 DAYS    Report Status PENDING  Incomplete   Organism ID, Bacteria STAPHYLOCOCCUS AUREUS  Final      Susceptibility   Staphylococcus aureus - MIC*    CIPROFLOXACIN <=0.5 SENSITIVE Sensitive     ERYTHROMYCIN >=8 RESISTANT Resistant     GENTAMICIN <=0.5 SENSITIVE Sensitive     OXACILLIN <=0.25 SENSITIVE Sensitive     TETRACYCLINE <=1 SENSITIVE Sensitive     VANCOMYCIN <=0.5 SENSITIVE Sensitive     TRIMETH/SULFA <=10 SENSITIVE Sensitive     CLINDAMYCIN RESISTANT Resistant     RIFAMPIN <=0.5 SENSITIVE Sensitive     Inducible Clindamycin POSITIVE Resistant     *  RARE STAPHYLOCOCCUS AUREUS    IMAGING RESULTS: Extensive bone infarcts involving proximal tibia, distal femoral I have personally reviewed the films ? 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 Difficult situation- Doubt IV /PO antibiotics is going to cure this the third time- HE needs surgery to remove the infarcted bone and other infected tissue Will give him weight adjusted cefazolin 3 grams IV every 8 hours for 6 weeks HE is being referred to orthocenter in  [place]  Will track ESR/CRP   H/o MSSA bacteremia- treated  Acute hypoxia /hypercarbia- improved now COVID related lung disease/ OSA/Critical illness polyneuropathy and obesity hypoventilation syndrome  Gout  H/o diverticulitis complicated by perforation followed by surgery, dehiscence  of anastomosis, resection of colon and has colostomy   ? ? ___________________________________________________ Discussed with patient, and his wife and requesting provider Will follow him as OP Note:  This document was prepared using Dragon voice recognition software and may include unintentional dictation errors.

## 2022-03-27 LAB — AEROBIC/ANAEROBIC CULTURE W GRAM STAIN (SURGICAL/DEEP WOUND)
Culture: NO GROWTH
Culture: NO GROWTH
Gram Stain: NONE SEEN
Gram Stain: NONE SEEN
Gram Stain: NONE SEEN
Gram Stain: NONE SEEN

## 2022-04-05 ENCOUNTER — Encounter: Payer: Self-pay | Admitting: Infectious Diseases

## 2022-04-17 ENCOUNTER — Ambulatory Visit: Payer: Medicaid Other | Attending: Infectious Diseases | Admitting: Infectious Diseases

## 2022-04-17 DIAGNOSIS — B9561 Methicillin susceptible Staphylococcus aureus infection as the cause of diseases classified elsewhere: Secondary | ICD-10-CM | POA: Insufficient documentation

## 2022-04-17 DIAGNOSIS — M00061 Staphylococcal arthritis, right knee: Secondary | ICD-10-CM | POA: Insufficient documentation

## 2022-04-17 NOTE — Progress Notes (Unsigned)
The purpose of this virtual visit is to provide medical care while limiting exposure to the novel coronavirus (COVID19) for both patient and office staff.   Consent was obtained for phone visit:  Yes.   Answered questions that patient had about telehealth interaction:  Yes.   I discussed the limitations, risks, security and privacy concerns of performing an evaluation and management service by telephone. I also discussed with the patient that there may be a patient responsible charge related to this service. The patient expressed understanding and agreed to proceed.   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 Extensive bone infarct involving the distal femoral metaphysis extending to the epiphysis. Small bone infarct in the proximal fibula and patella.  Pt is currently on IV cefazolin and has an appt with ortho [facility]  2nd week of September He is doing better but the rt knee still hurts He cannot walk He has no fever Plan continue cefazolin IV Follow up after his visit to ortho [place] Total time spent 9 min

## 2022-05-01 ENCOUNTER — Telehealth: Payer: Self-pay

## 2022-05-01 ENCOUNTER — Ambulatory Visit: Payer: Medicaid Other | Attending: Infectious Diseases | Admitting: Infectious Diseases

## 2022-05-01 DIAGNOSIS — M00061 Staphylococcal arthritis, right knee: Secondary | ICD-10-CM | POA: Diagnosis not present

## 2022-05-01 NOTE — Telephone Encounter (Signed)
Message sent to [facility] through Epic extending patient's cefazolin for 2 weeks per Dr. [name]. Annalei Friesz T Brooks Sailors

## 2022-05-01 NOTE — Progress Notes (Signed)
The purpose of this virtual visit is to provide medical care while limiting exposure to the novel coronavirus (COVID19) for both patient and office staff.   Consent was obtained for phone visit:  Yes.   Answered questions that patient had about telehealth interaction:  Yes.   I discussed the limitations, risks, security and privacy concerns of performing an evaluation and management service by telephone. I also discussed with the patient that there may be a patient responsible charge related to this service. The patient expressed understanding and agreed to proceed.   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 Extensive bone infarct involving the distal femoral metaphysis extending to the epiphysis. Small bone infarct in the proximal fibula and patella.   He had an appt with ortho [place] on 9/5 and hey are planning knee resection and spacer placement in Oct Pt is currently on IV cefazolin - end date is 9/14- will extend for 2 morew weeks and then place him on PO keflex until he has the surgery - after that he will go back on IV antibiotics for 6 weeks He says he is doing better  Rt knee stiff and he cannot walk He has no fever  Plan Follow labs Continue cefazolin- informed [facility]  Total time spent on the call 15  min     [facility] Orders Cefazolin 3 grams IV every 8 hours as infusion Duration: 8 weeks End Date:05/17/22     [facility] Care Per Protocol:   Labs weekly while on IV antibiotics: X__ CBC with differential __ BMP _X_ CMP _X_ CRP _X_ ESR     __X Please pull PIC at completion of IV antibiotics     Fax weekly lab results  promptly to [phone]

## 2022-05-09 ENCOUNTER — Telehealth: Payer: Self-pay

## 2022-05-09 NOTE — Telephone Encounter (Signed)
Craig [name] with [facility] called stating patient's wife was confused about patient's end date for IV antibiotics. I advised her that end date now is 05/17/22 since IV antibiotic extension.   I reached out to the patient as well and informed her of patient's new end date for IV antibiotics.  [name] also stated patient was having some irritation around the picc line area, but seems to be improving. She will call back if it worsens. She stated patient is not having any fevers or other symptoms

## 2022-05-17 ENCOUNTER — Ambulatory Visit: Payer: Medicaid Other | Attending: Infectious Diseases | Admitting: Infectious Diseases

## 2022-05-17 DIAGNOSIS — R0989 Other specified symptoms and signs involving the circulatory and respiratory systems: Secondary | ICD-10-CM | POA: Insufficient documentation

## 2022-05-17 DIAGNOSIS — Z20822 Contact with and (suspected) exposure to covid-19: Secondary | ICD-10-CM | POA: Diagnosis not present

## 2022-05-17 DIAGNOSIS — B9561 Methicillin susceptible Staphylococcus aureus infection as the cause of diseases classified elsewhere: Secondary | ICD-10-CM | POA: Diagnosis not present

## 2022-05-17 DIAGNOSIS — J069 Acute upper respiratory infection, unspecified: Secondary | ICD-10-CM | POA: Diagnosis not present

## 2022-05-17 DIAGNOSIS — M00061 Staphylococcal arthritis, right knee: Secondary | ICD-10-CM | POA: Diagnosis not present

## 2022-05-17 MED ORDER — CEPHALEXIN 500 MG PO CAPS: 500.0000 mg | ORAL_CAPSULE | Freq: Four times a day (QID) | ORAL | 0 refills | Status: DC

## 2022-05-17 NOTE — Progress Notes (Signed)
The purpose of this virtual visit is to provide medical care while limiting exposure to the novel coronavirus (COVID19) for both patient and office staff.   Consent was obtained for phone visit:  Yes.   Answered questions that patient had about telehealth interaction:  Yes.   I discussed the limitations, risks, security and privacy concerns of performing an evaluation and management service by telephone. I also discussed with the patient that there may be a patient responsible charge related to this service. The patient expressed understanding and agreed to proceed.   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 Extensive bone infarct involving the distal femoral metaphysis extending to the epiphysis. Small bone infarct in the proximal fibula and patella.   Plan Complete Iv cefazolin Pull picc Start Keflex Spoke to [place]-  She will manage his respiratory  symptoms  she thinks he has URI due to virus- She is not plannign to give any antibiotics If he desaturates then she wants him to go to the ED  I spoke to his wife who was at work and explained my conversation with his PCP . Explained to her that we will stop cefazolin IV today and remove PICC Keflex 579m Po Q 6 prescription sent to pharmacy [facility] Total  time spent on the call 22  min including care coordination

## 2022-06-20 ENCOUNTER — Encounter: Payer: Self-pay | Admitting: Infectious Diseases

## 2022-06-20 ENCOUNTER — Other Ambulatory Visit: Payer: Self-pay | Admitting: Infectious Diseases

## 2022-06-20 MED ORDER — CEPHALEXIN 500 MG PO CAPS: 500.0000 mg | ORAL_CAPSULE | Freq: Four times a day (QID) | ORAL | 1 refills | Status: DC

## 2022-06-20 NOTE — Progress Notes (Signed)
Sent 2 month supply of keflex

## 2022-07-31 ENCOUNTER — Telehealth: Payer: Self-pay

## 2022-07-31 NOTE — Telephone Encounter (Signed)
Dr. Raelene Bott [phone]) called regarding patient's treatment plan. Per. Dr. Raelene Bott she is unable to treat patient with Rifampin due to the patient being on Xarelto. Dr. [name] has been informed and verbalized understanding.  Ermine Spofford T Brooks Sailors

## 2022-08-02 ENCOUNTER — Encounter: Payer: Self-pay | Admitting: Infectious Diseases

## 2022-08-02 ENCOUNTER — Ambulatory Visit: Payer: Medicaid Other | Attending: Infectious Diseases | Admitting: Infectious Diseases

## 2022-08-02 ENCOUNTER — Telehealth: Payer: Self-pay

## 2022-08-02 DIAGNOSIS — M00061 Staphylococcal arthritis, right knee: Secondary | ICD-10-CM

## 2022-08-02 NOTE — Progress Notes (Signed)
[phone] 

## 2022-08-02 NOTE — Telephone Encounter (Signed)
Per Dr. [name] Iv cefazolin will be increased to 3 grams Q 8 instead of 2 grams for the patient and he will need the grenade ball instead of Iv push.  I have sent a community message to the [employer] team to update them on treatment changes.  Favor Kreh T Brooks Sailors

## 2022-08-02 NOTE — Progress Notes (Signed)
The purpose of this virtual visit is to provide medical care while limiting exposure to the novel coronavirus (COVID19) for both patient and office staff.   Consent was obtained for phone visit:  Yes.   Answered questions that patient had about telehealth interaction:  Yes.   I discussed the limitations, risks, security and privacy concerns of performing an evaluation and management service by telephone. I also discussed with the patient that there may be a patient responsible charge related to this service. The patient expressed understanding and agreed to proceed.   Patient Location: Home Provider Location:office 31 yr male has a complicated medical history since April 2021. Diverticulitis with colon perforation, emergent colectomy and end colostomy. In Nov 2021 underwent colostomy take down, complictaed by dehiscence pneumoperitoneum, aprtial colectomy and colostomy Dec 2021 COVID , admitted intubated , tracheostomy and had at that time MSSA bacteremia and pneumonia and treated with 6 weeks Iv antibiotic Then went to [facility] and home He was having rt knee pain in June 2022 and had intraarticular steroids. Saw the rheumatologist He was readmitted in Nov for MSSA bacteremia and rt septic knee arthritis and has been on prolonged intermittent courses of antibiotic since then . In the last 4 months he has been on continuous antibiotic and eventually referred to ortho [place] for knee replacement   Pt underwent rt TKA and insertion of drug implant spacer device on 07/27/22 by ortho [facility] in [place] 4 cultures done during surgery were positive for MSSA He was discharged on 07/31/22 with PICC line and IV cefazolin 2 grams Q8. The IDP Dr.ngo spoke to me about him and I will be following his IV antibiotics HE weighs 284 pounds- which is nearly 130 Kgs So will increase the cefazolin to 3 grams IV every 8 hours Pt says his pain rt knee is better He has not started PT yet Also called his wife and spoke  to her   Plan Increase cefazolin to 3 grams IV q8 ( pts wife says he did better when he was given 3 grams instead of 2 the last time) Continue 6 weeks IV with weekly lab monitoring he will follow up with ortho next month Total time spent  on the telephone call 30 min

## 2022-08-03 ENCOUNTER — Encounter: Payer: Self-pay | Admitting: Infectious Diseases

## 2022-08-16 ENCOUNTER — Ambulatory Visit: Payer: Medicaid Other | Attending: Infectious Diseases | Admitting: Infectious Diseases

## 2022-08-16 DIAGNOSIS — Z8616 Personal history of COVID-19: Secondary | ICD-10-CM | POA: Insufficient documentation

## 2022-08-16 DIAGNOSIS — Z9049 Acquired absence of other specified parts of digestive tract: Secondary | ICD-10-CM | POA: Insufficient documentation

## 2022-08-16 DIAGNOSIS — T8453XA Infection and inflammatory reaction due to internal right knee prosthesis, initial encounter: Secondary | ICD-10-CM | POA: Diagnosis present

## 2022-08-16 DIAGNOSIS — Z95828 Presence of other vascular implants and grafts: Secondary | ICD-10-CM | POA: Diagnosis not present

## 2022-08-16 DIAGNOSIS — Z792 Long term (current) use of antibiotics: Secondary | ICD-10-CM | POA: Insufficient documentation

## 2022-08-16 DIAGNOSIS — B9561 Methicillin susceptible Staphylococcus aureus infection as the cause of diseases classified elsewhere: Secondary | ICD-10-CM | POA: Diagnosis not present

## 2022-08-16 DIAGNOSIS — M00061 Staphylococcal arthritis, right knee: Secondary | ICD-10-CM | POA: Diagnosis not present

## 2022-08-16 NOTE — Progress Notes (Signed)
The purpose of this virtual visit is to provide medical care while limiting exposure to the novel coronavirus (COVID19) for both patient and office staff.   Consent was obtained for phone visit:  Yes.   Answered questions that patient had about telehealth interaction:  Yes.   I discussed the limitations, risks, security and privacy concerns of performing an evaluation and management service by telephone. I also discussed with the patient that there may be a patient responsible charge related to this service. The patient expressed understanding and agreed to proceed.   Patient Location: Home Provider Location:office  Follow up visit for rt knee joint infection s/p resection of the joint with spacer placement 31 yr male has a complicated medical history since April 2021. Diverticulitis with colon perforation, emergent colectomy and end colostomy. In Nov 2021 underwent colostomy take down, complictaed by dehiscence pneumoperitoneum, aprtial colectomy and colostomy Dec 2021 COVID , admitted intubated , tracheostomy and had at that time MSSA bacteremia and pneumonia and treated with 6 weeks Iv antibiotic Then went to [facility] and home He was having rt knee pain in June 2022 and had intraarticular steroids. Saw the rheumatologist He was readmitted in Nov for MSSA bacteremia and rt septic knee arthritis and has been on prolonged intermittent courses of antibiotic since then . In the last 4 months he has been on continuous antibiotic and eventually referred to ortho [place] for knee replacement   Pt underwent rt TKA and insertion of drug implant spacer device on 07/27/22 by ortho [facility] in [place] 4 cultures done during surgery were positive for MSSA He was discharged on 07/31/22 with PICC line and IV cefazolin 2 grams Q8. The IDP Dr.ngo spoke to me about him and I will be following his IV antibiotics HE weighs 284 pounds- which is nearly 130 Kgs So increased the cefazolin to 3 grams IV every 8  hours HE initially had some nausea and chest tightness and thought it was due to cefazolin but it has resolved  HE says his pain rt knee is better  Plan Continue cefazolin to 3 grams IV q8 until 09/08/22 Continue 6 weeks IV with weekly lab monitoring he will follow up with ortho Jan 6th Total time spent  on the telephone call 10 min

## 2022-09-03 ENCOUNTER — Telehealth: Payer: Self-pay

## 2022-09-03 NOTE — Telephone Encounter (Signed)
Community Message sent to [employer] with orders for the patient.  Per Dr. [name] IV antibiotics end on 09/08/22 and picc can be removed after last dose. Patient will need to have blood culture done on 10/09/22 at [facility] if possible. Patient aware that he will need blood cultures.

## 2022-09-04 NOTE — Telephone Encounter (Signed)
Please review ESR and CRP for the patient. I have already given order for end date for IV antibiotics and picc line removal.  I also confirmed with Abigail with [employer] that orders were received. [name] advised blood cultures are not needed until 10/09/22 and he will have to get those done at the  [facility] lab because he will no longer have nursing services after picc line is removed.

## 2022-09-06 ENCOUNTER — Other Ambulatory Visit: Payer: Self-pay | Admitting: Infectious Diseases

## 2022-09-06 DIAGNOSIS — M00061 Staphylococcal arthritis, right knee: Secondary | ICD-10-CM

## 2022-09-06 MED ORDER — CEPHALEXIN 500 MG PO CAPS: 1000.0000 mg | ORAL_CAPSULE | Freq: Four times a day (QID) | ORAL | 0 refills | Status: DC

## 2022-09-06 NOTE — Progress Notes (Signed)
Called patients wife and discusse dlatest ESR and CRP which are still high but better than Dec 2023 He is finishing 6 weeks of iV cefazolin on 09/08/22- He will start Keflex 1 gram PO q 6 for a month Will do labs ESR/CRP/CMP/CBC Feb 13 and will see him then as well

## 2022-10-02 ENCOUNTER — Ambulatory Visit: Payer: Medicaid Other | Attending: Infectious Diseases | Admitting: Infectious Diseases

## 2022-10-02 DIAGNOSIS — M009 Pyogenic arthritis, unspecified: Secondary | ICD-10-CM | POA: Diagnosis not present

## 2022-10-02 DIAGNOSIS — M25561 Pain in right knee: Secondary | ICD-10-CM | POA: Diagnosis not present

## 2022-10-02 DIAGNOSIS — R7881 Bacteremia: Secondary | ICD-10-CM | POA: Diagnosis not present

## 2022-10-02 DIAGNOSIS — M8669 Other chronic osteomyelitis, multiple sites: Secondary | ICD-10-CM | POA: Insufficient documentation

## 2022-10-02 DIAGNOSIS — M00061 Staphylococcal arthritis, right knee: Secondary | ICD-10-CM | POA: Insufficient documentation

## 2022-10-02 DIAGNOSIS — K5792 Diverticulitis of intestine, part unspecified, without perforation or abscess without bleeding: Secondary | ICD-10-CM | POA: Diagnosis not present

## 2022-10-02 DIAGNOSIS — K631 Perforation of intestine (nontraumatic): Secondary | ICD-10-CM | POA: Insufficient documentation

## 2022-10-02 DIAGNOSIS — B9561 Methicillin susceptible Staphylococcus aureus infection as the cause of diseases classified elsewhere: Secondary | ICD-10-CM | POA: Insufficient documentation

## 2022-10-02 NOTE — Progress Notes (Signed)
The purpose of this virtual visit is to provide medical care while limiting exposure to the novel coronavirus (COVID19) for both patient and office staff.   Consent was obtained for phone visit:  Yes.   Answered questions that patient had about telehealth interaction:  Yes.   I discussed the limitations, risks, security and privacy concerns of performing an evaluation and management service by telephone. I also discussed with the patient that there may be a patient responsible charge related to this service. The patient expressed understanding and agreed to proceed.   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 He completed 6 weeks of Iv cefazolin on 09/08/22 and following which he has been on PO keflex 554m Q 6- He is doing well- No pain rt knee- able to stand with help for 1 minute No fever, chills, rash, diarrhea, knee swelling He will get ESR/CRP this week He has f/u with [facility] in March   Following taken from last note 89yr male has a complicated medical history since April 2021. Diverticulitis with colon perforation, emergent colectomy and end colostomy. In Nov 2021 underwent colostomy take down, complicated by dehiscence pneumoperitoneum, partial colectomy and colostomy Dec 2021 COVID , admitted intubated , tracheostomy and had at that time MSSA bacteremia and pneumonia and treated with 6 weeks Iv antibiotic [facility] He was having rt knee pain in June 2022 and had intraarticular steroids. Saw the rheumatologist He was readmitted in Nov 2022 for MSSA bacteremia and rt septic knee arthritis and has been on prolonged intermittent courses of antibiotic since then . In the last 4 months he has been on continuous  antibiotic and [place] . 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 Will discuss with ortho cNarda Amber([phone]  Total time spent on this call- 12 min

## 2022-10-05 ENCOUNTER — Other Ambulatory Visit
Admission: RE | Admit: 2022-10-05 | Discharge: 2022-10-05 | Disposition: A | Discharge status: Home / Self Care | Payer: Medicaid Other | Source: Ambulatory Visit | Attending: Infectious Diseases | Admitting: Infectious Diseases

## 2022-10-05 DIAGNOSIS — M00061 Staphylococcal arthritis, right knee: Secondary | ICD-10-CM | POA: Insufficient documentation

## 2022-10-05 LAB — CBC WITH DIFFERENTIAL/PLATELET
Abs Immature Granulocytes: 0.02 10*3/uL (ref 0.00–0.07)
Basophils Absolute: 0.1 10*3/uL (ref 0.0–0.1)
Basophils Relative: 1 %
Eosinophils Absolute: 0.2 10*3/uL (ref 0.0–0.5)
Eosinophils Relative: 2 %
HCT: 46.1 % (ref 39.0–52.0)
Hemoglobin: 14.8 g/dL (ref 13.0–17.0)
Immature Granulocytes: 0 %
Lymphocytes Relative: 24 %
Lymphs Abs: 2.1 10*3/uL (ref 0.7–4.0)
MCH: 28.9 pg (ref 26.0–34.0)
MCHC: 32.1 g/dL (ref 30.0–36.0)
MCV: 90 fL (ref 80.0–100.0)
Monocytes Absolute: 0.5 10*3/uL (ref 0.1–1.0)
Monocytes Relative: 6 %
Neutro Abs: 5.9 10*3/uL (ref 1.7–7.7)
Neutrophils Relative %: 67 %
Platelets: 208 10*3/uL (ref 150–400)
RBC: 5.12 MIL/uL (ref 4.22–5.81)
RDW: 14.9 % (ref 11.5–15.5)
WBC: 8.7 10*3/uL (ref 4.0–10.5)
nRBC: 0 % (ref 0.0–0.2)

## 2022-10-05 LAB — COMPREHENSIVE METABOLIC PANEL
ALT: 60 U/L — ABNORMAL HIGH (ref 0–44)
AST: 81 U/L — ABNORMAL HIGH (ref 15–41)
Albumin: 4.3 g/dL (ref 3.5–5.0)
Alkaline Phosphatase: 106 U/L (ref 38–126)
Anion gap: 13 (ref 5–15)
BUN: 11 mg/dL (ref 6–20)
CO2: 27 mmol/L (ref 22–32)
Calcium: 9.4 mg/dL (ref 8.9–10.3)
Chloride: 94 mmol/L — ABNORMAL LOW (ref 98–111)
Creatinine, Ser: 0.65 mg/dL (ref 0.61–1.24)
GFR, Estimated: >60 mL/min (ref >60)
Glucose, Bld: 173 mg/dL — ABNORMAL HIGH (ref 70–99)
Potassium: 4.7 mmol/L (ref 3.5–5.1)
Sodium: 134 mmol/L — ABNORMAL LOW (ref 135–145)
Total Bilirubin: 0.9 mg/dL (ref 0.3–1.2)
Total Protein: 8.3 g/dL — ABNORMAL HIGH (ref 6.5–8.1)

## 2022-10-05 LAB — SEDIMENTATION RATE: Sed Rate: 19 mm/hr — ABNORMAL HIGH (ref 0–15)

## 2022-10-05 LAB — C-REACTIVE PROTEIN: CRP: 6.1 mg/dL — ABNORMAL HIGH (ref <1.0)

## 2022-10-08 ENCOUNTER — Encounter: Payer: Self-pay | Admitting: Infectious Diseases

## 2022-10-10 ENCOUNTER — Encounter: Payer: Self-pay | Admitting: Infectious Diseases

## 2022-10-24 ENCOUNTER — Other Ambulatory Visit: Payer: Self-pay | Admitting: Infectious Diseases

## 2022-10-24 ENCOUNTER — Other Ambulatory Visit
Admission: RE | Admit: 2022-10-24 | Discharge: 2022-10-24 | Disposition: A | Discharge status: Home / Self Care | Payer: Medicaid Other | Attending: Infectious Diseases | Admitting: Infectious Diseases

## 2022-10-24 DIAGNOSIS — M00061 Staphylococcal arthritis, right knee: Secondary | ICD-10-CM

## 2022-10-24 LAB — C-REACTIVE PROTEIN: CRP: 10.2 mg/dL — ABNORMAL HIGH (ref <1.0)

## 2022-10-24 LAB — HEPATIC FUNCTION PANEL
ALT: 34 U/L (ref 0–44)
AST: 41 U/L (ref 15–41)
Albumin: 3.5 g/dL (ref 3.5–5.0)
Alkaline Phosphatase: 93 U/L (ref 38–126)
Bilirubin, Direct: 0.1 mg/dL (ref 0.0–0.2)
Indirect Bilirubin: 0.5 mg/dL (ref 0.3–0.9)
Total Bilirubin: 0.6 mg/dL (ref 0.3–1.2)
Total Protein: 7.7 g/dL (ref 6.5–8.1)

## 2022-10-24 LAB — SEDIMENTATION RATE: Sed Rate: 58 mm/hr — ABNORMAL HIGH (ref 0–15)

## 2022-10-24 NOTE — Progress Notes (Signed)
Blood culture X 2 ordered ESR/CRP/LFT ordered as well

## 2022-10-29 LAB — CULTURE, BLOOD (ROUTINE X 2)
Culture: NO GROWTH
Special Requests: ADEQUATE

## 2022-10-30 ENCOUNTER — Encounter: Payer: Self-pay | Admitting: Infectious Diseases

## 2022-11-09 ENCOUNTER — Emergency Department: Payer: Medicaid Other

## 2022-11-09 ENCOUNTER — Emergency Department
Admission: EM | Admit: 2022-11-09 | Discharge: 2022-11-10 | Disposition: A | Discharge status: Other Acute Inpatient Hospital | Payer: Medicaid Other | Attending: Emergency Medicine | Admitting: Emergency Medicine

## 2022-11-09 DIAGNOSIS — L02416 Cutaneous abscess of left lower limb: Secondary | ICD-10-CM | POA: Insufficient documentation

## 2022-11-09 DIAGNOSIS — M869 Osteomyelitis, unspecified: Secondary | ICD-10-CM | POA: Insufficient documentation

## 2022-11-09 DIAGNOSIS — Z20822 Contact with and (suspected) exposure to covid-19: Secondary | ICD-10-CM | POA: Diagnosis not present

## 2022-11-09 DIAGNOSIS — A419 Sepsis, unspecified organism: Secondary | ICD-10-CM | POA: Diagnosis not present

## 2022-11-09 DIAGNOSIS — J9610 Chronic respiratory failure, unspecified whether with hypoxia or hypercapnia: Secondary | ICD-10-CM | POA: Insufficient documentation

## 2022-11-09 DIAGNOSIS — E669 Obesity, unspecified: Secondary | ICD-10-CM | POA: Insufficient documentation

## 2022-11-09 DIAGNOSIS — Z7901 Long term (current) use of anticoagulants: Secondary | ICD-10-CM | POA: Insufficient documentation

## 2022-11-09 DIAGNOSIS — R600 Localized edema: Secondary | ICD-10-CM | POA: Insufficient documentation

## 2022-11-09 DIAGNOSIS — M7989 Other specified soft tissue disorders: Secondary | ICD-10-CM | POA: Diagnosis present

## 2022-11-09 LAB — CBC
HCT: 42.5 % (ref 39.0–52.0)
Hemoglobin: 13.5 g/dL (ref 13.0–17.0)
MCH: 28.3 pg (ref 26.0–34.0)
MCHC: 31.8 g/dL (ref 30.0–36.0)
MCV: 89.1 fL (ref 80.0–100.0)
Platelets: 243 10*3/uL (ref 150–400)
RBC: 4.77 MIL/uL (ref 4.22–5.81)
RDW: 14.3 % (ref 11.5–15.5)
WBC: 12.4 10*3/uL — ABNORMAL HIGH (ref 4.0–10.5)
nRBC: 0 % (ref 0.0–0.2)

## 2022-11-09 LAB — URINALYSIS, ROUTINE W REFLEX MICROSCOPIC
Bilirubin Urine: NEGATIVE
Glucose, UA: NEGATIVE mg/dL
Hgb urine dipstick: NEGATIVE
Ketones, ur: NEGATIVE mg/dL
Leukocytes,Ua: NEGATIVE
Nitrite: NEGATIVE
Protein, ur: NEGATIVE mg/dL
Specific Gravity, Urine: 1.002 — ABNORMAL LOW (ref 1.005–1.030)
pH: 7 (ref 5.0–8.0)

## 2022-11-09 LAB — COMPREHENSIVE METABOLIC PANEL
ALT: 16 U/L (ref 0–44)
AST: 16 U/L (ref 15–41)
Albumin: 3.5 g/dL (ref 3.5–5.0)
Alkaline Phosphatase: 98 U/L (ref 38–126)
Anion gap: 10 (ref 5–15)
BUN: 7 mg/dL (ref 6–20)
CO2: 34 mmol/L — ABNORMAL HIGH (ref 22–32)
Calcium: 9.5 mg/dL (ref 8.9–10.3)
Chloride: 91 mmol/L — ABNORMAL LOW (ref 98–111)
Creatinine, Ser: 0.54 mg/dL — ABNORMAL LOW (ref 0.61–1.24)
GFR, Estimated: >60 mL/min (ref >60)
Glucose, Bld: 160 mg/dL — ABNORMAL HIGH (ref 70–99)
Potassium: 4.4 mmol/L (ref 3.5–5.1)
Sodium: 135 mmol/L (ref 135–145)
Total Bilirubin: 0.6 mg/dL (ref 0.3–1.2)
Total Protein: 8.4 g/dL — ABNORMAL HIGH (ref 6.5–8.1)

## 2022-11-09 LAB — BRAIN NATRIURETIC PEPTIDE: B Natriuretic Peptide: 317.9 pg/mL — ABNORMAL HIGH (ref 0.0–100.0)

## 2022-11-09 MED ORDER — LACTATED RINGERS IV BOLUS
1000.0000 mL | Freq: Once | INTRAVENOUS | Status: AC
  Administered 2022-11-10: 1000 mL via INTRAVENOUS

## 2022-11-09 NOTE — ED Provider Notes (Addendum)
[facility] Provider Note    Event Date/Time   First MD Initiated Contact with Patient 11/09/22 2253     (approximate)   History   Knee Injury   HPI  Craig [name] is a 32 y.o. male with an extremely complicated and extensive medical history, particularly for someone of his age.  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  He is on anticoagulation (Xarelto).  He presents tonight for worsening pain and swelling on the outer part of his left knee just above the knee itself (left lateral lower thigh).  The symptoms have been worsening for the last few days and become severe.  He thought at first that perhaps he struck it when he was at physical therapy but it has been getting worse and acting more like an infection similar to what he had on the right leg in the past rather than a wound or injury.  He is able to flex and extend his knee but it causes pain to do so.  He has obvious redness and swelling to the side of the leg.  Of note, he had blood cultures about 2 weeks ago and is followed by Dr. Joylene Draft with infectious disease.  His cultures were clear at the time and he is not being antibiosis.  He was also noted to be hypoxic when he came to the emergency department.  He was 88% on 2 L and typically does not use oxygen while he is awake.  On 3 L he came up to 93%.  He denies fever, chest pain, shortness of breath (at least greater than baseline), nausea, vomiting, and abdominal pain.  Mostly he is concerned about the pain and swelling just above  his left knee on the lateral aspect of it.     Physical Exam   Triage Vital Signs: ED Triage Vitals  Enc Vitals Group     BP 11/09/22 2043 124/84     Pulse Rate 11/09/22 2043 (!) 111     Resp 11/09/22 2043 20     Temp 11/09/22 2043 98.8 F (37.1 C)     Temp Source 11/09/22 2043 Oral     SpO2 11/09/22 2044 93 %     Weight 11/09/22 2043 122.5 kg (270 lb)     Height 11/09/22 2043 1.854 m (6\' 1" )     Head Circumference --      Peak Flow --      Pain Score 11/09/22 2047 7     Pain Loc --      Pain Edu? --      Excl. in GC? --     Most recent vital signs: Vitals:   11/10/22 0518 11/10/22 0600  BP:  (!) 130/50  Pulse:  (!) 104  Resp:  (!) 26  Temp: 98.7 F (37.1 C)   SpO2:  94%     General: Awake, no distress.  Appears chronically ill, morbidly obese, but is awake, alert, and acting appropriate with normal mood and affect. CV:  Good peripheral perfusion.  Tachycardia, normal heart sounds although diminished by patient's body habitus. Resp:  Normal effort.  Speaking easily and comfortably, no accessory muscle usage nor intercostal retractions.  Lungs are clear to auscultation.  Patient is requiring extra oxygen support compared to baseline. Abd:  No distention.  Morbid obesity.  Ostomy is in place with appropriate contents in the bag.  No tenderness to palpation. Other:  Patient's body habitus makes the exam somewhat difficult.  He has obvious swelling and erythema, however, to the left lateral thigh, distal towards the knee, but starting about mid femur.  The area is indurated and tender.  He is able to flex his knee both passively and actively (to acute degree allowed by his deconditioning), but he is limited by pain not in the joint itself but by a pulling and painful sensation in the left lateral aspect of the muscle.  See photo below.     ED Results / Procedures / Treatments   Labs (all labs ordered are listed, but only abnormal results are displayed) Labs Reviewed   COMPREHENSIVE METABOLIC PANEL - Abnormal; Notable for the following components:      Result Value   Chloride 91 (*)    CO2 34 (*)    Glucose, Bld 160 (*)    Creatinine, Ser 0.54 (*)    Total Protein 8.4 (*)    All other components within normal limits  CBC - Abnormal; Notable for the following components:   WBC 12.4 (*)    All other components within normal limits  URINALYSIS, ROUTINE W REFLEX MICROSCOPIC - Abnormal; Notable for the following components:   Color, Urine STRAW (*)    APPearance CLEAR (*)    Specific Gravity, Urine 1.002 (*)    All other components within normal limits  BRAIN NATRIURETIC PEPTIDE - Abnormal; Notable for the following components:   B Natriuretic Peptide 317.9 (*)    All other components within normal limits  PROTIME-INR - Abnormal; Notable for the following components:   Prothrombin Time 19.8 (*)    INR 1.7 (*)    All other components within normal limits  CULTURE, BLOOD (ROUTINE X 2)  CULTURE, BLOOD (ROUTINE X 2)  RESP PANEL BY RT-PCR (RSV, FLU A&B, COVID)  RVPGX2  LACTIC ACID, PLASMA  APTT  TROPONIN I (HIGH SENSITIVITY)     EKG  ED ECG REPORT I, Loleta Rose, the attending physician, personally viewed and interpreted this ECG.  Date: 11/10/2022 EKG Time: 00: 00 Rate: 116 Rhythm: Sinus tachycardia QRS Axis: normal Intervals: normal ST/T Wave abnormalities: Non-specific ST segment / T-wave changes, but no clear evidence of acute ischemia. Narrative Interpretation: no definitive evidence of acute ischemia; does not meet STEMI criteria.    RADIOLOGY I viewed and interpreted the patient's left knee x-rays.  Nonspecific swelling.  Radiology is concerned about acute joint space infection versus ischemic bone similar to the avascular necrosis seen on the right leg.  See hospital course for details regarding the other imaging: CTA chest does not show signs of PE or infectious process.  Left knee CT shows extensive lesion that is most likely  abscess but could also represent tumor with what appears to be chronic but advanced osteomyelitis.    PROCEDURES:  Critical Care performed: Yes, see critical care procedure note(s)  .1-3 Lead EKG Interpretation  Performed by: Loleta Rose, MD Authorized by: Loleta Rose, MD     Interpretation: abnormal     ECG rate:  110   ECG rate assessment: tachycardic     Rhythm: sinus tachycardia     Ectopy: none  Conduction: normal   .Critical Care  Performed by: Loleta Rose, MD Authorized by: Loleta Rose, MD   Critical care provider statement:    Critical care time (minutes):  60   Critical care time was exclusive of:  Separately billable procedures and treating other patients   Critical care was necessary to treat or prevent imminent or life-threatening deterioration of the following conditions:  Sepsis   Critical care was time spent personally by me on the following activities:  Development of treatment plan with patient or surrogate, evaluation of patient's response to treatment, examination of patient, obtaining history from patient or surrogate, ordering and performing treatments and interventions, ordering and review of laboratory studies, ordering and review of radiographic studies, pulse oximetry, re-evaluation of patient's condition and review of old charts    MEDICATIONS ORDERED IN ED: Medications  vancomycin (VANCOREADY) IVPB 2000 mg/400 mL (2,000 mg Intravenous New Bag/Given 11/10/22 0521)  morphine (PF) 4 MG/ML injection 4 mg (4 mg Intravenous Given 11/10/22 0340)  ondansetron (ZOFRAN) injection 4 mg (has no administration in time range)  0.9 %  sodium chloride infusion (has no administration in time range)  lactated ringers bolus 1,000 mL (0 mLs Intravenous Stopped 11/10/22 0322)  iohexol (OMNIPAQUE) 350 MG/ML injection 100 mL (100 mLs Intravenous Contrast Given 11/10/22 0043)  acetaminophen (TYLENOL) tablet 1,000 mg (1,000 mg Oral Given 11/10/22 0309)  lactated ringers  bolus 1,000 mL (0 mLs Intravenous Stopped 11/10/22 0518)    And  lactated ringers bolus 400 mL (400 mLs Intravenous New Bag/Given 11/10/22 0524)  ceFEPIme (MAXIPIME) 2 g in sodium chloride 0.9 % 100 mL IVPB (0 g Intravenous Stopped 11/10/22 0354)  metroNIDAZOLE (FLAGYL) IVPB 500 mg (0 mg Intravenous Stopped 11/10/22 0518)     IMPRESSION / MDM / ASSESSMENT AND PLAN / ED COURSE  I reviewed the triage vital signs and the nursing notes.                              Differential diagnosis includes, but is not limited to, bacteremia, septic knee arthritis, osteomyelitis, hematoma or infected hematoma, myositis, cellulitis.  Patient's presentation is most consistent with acute presentation with potential threat to life or bodily function.  Labs/studies ordered: Respiratory viral panel, high-sensitivity troponin, lactic acid, blood cultures x 2, coagulation studies, BNP, CMP, CBC, urinalysis, CT angio chest PE study, x-rays of left knee, CT scan of left knee with IV contrast Interventions/Medications given: Cefepime 2 g IV, metronidazole 500 mg IV, vancomycin 2 g IV, LR 2.4 L IV, normal saline infusion at 100 mL/h, morphine 4 mg IV, Zofran 4 mg IV [facility] Course my include additional interventions or labs/studies not listed above.)  Very concerned about the possibility of infected hematoma in the patient's left thigh.  The fact that the pain he feels upon flexion of his left knee is actually in the left thigh is reassuring that this is not actually joint space infection, but I am concerned about the induration and swelling.  His compartments are tense but not consistent with compartment syndrome although that theoretically could develop given that he may have some substantial internal swelling if he sustained an injury while on anticoagulation.  He is awake and alert, interacting appropriately with me, afebrile.  He has tachycardia and mild tachypnea as well as hide his hypoxia could all simply be  chronic issues for him.  I will hold off on empiric antibiotics for now but will initiate a "possible  sepsis" workup.  I am ordering a CTA chest to rule out pulmonary embolism once I verify that his renal function can support it.  Even though he is on anticoagulation, he is at high risk for PE and that could explain both his tachycardia and his increased hypoxia over baseline.  While obtaining the CT scan of his chest, I am also ordering a CT of his left knee which will include his distal thigh to better identify the possibility of acute bleed with extravasation versus infectious process and to evaluate his joint and the associated bones.  I am concerned that he may have seeded from his prior bacteremia and although a prior infection was not previously identified in his leg, this could be a subacute or chronic issue that has become suddenly worse.  Patient and his wife understand and agree with the plan.  The patient is on the cardiac monitor to evaluate for evidence of arrhythmia and/or significant heart rate changes.   Clinical Course as of 11/10/22 0706  Sat Nov 10, 2022  0113 Labs notable for no lactic acidosis, mild leukocytosis of 12.4, essentially normal metabolic panel. [CF]  0124 CT Angio Chest PE W/Cm &/Or Wo Cm I viewed and interpreted the patient's CTA chest.  There is no evidence of PE or acute infectious process in the patient's lungs.  Radiology report agrees and mentions some chronic changes but no acute abnormalities. [CF]  0227 I viewed and interpreted the patient's CT left knee and it is so grossly abnormal it is difficult to identify what exactly is going on.  The patient seems to have a giant abscess that is affecting the bone and joint space with some osteomyelitis.  The radiologist concurred that there are extensive changes and abnormalities that most likely stemming from an abscess.  I consulted by phone with Dr. Audelia Acton with orthopedics.  We discussed the case in detail and  he reviewed all the images.  He concurred that this is quite serious and well beyond the capacity of a community hospital.  He feels the patient would benefit from a tertiary care center.  He agrees to talk with the specialist at the other hospital if it would facilitate the transfer, but he is very concerned about the extent of the abscess versus tumor and its involvement with the neurovascular bundle of the left knee.  I updated the patient and his wife.  I explained the situation and we discussed various options including returning to [place] versus going to one of the other major medical centers around here.  We agreed that transfer to [facility] may take too long for ideal care for him, and they requested that I consult [facility] which I think is appropriate.  They understand that if do cannot accommodate him I will also reach out to [facility].  Calling [facility] for transfer.  Images have been power shared [CF]  0234 [facility] w/ [facility] [CF]  0241 Awaiting word back from [facility].  Given that the patient spiked a fever of 101.1, remains tachycardic after 1 L of fluid, remains mildly tachypneic (although this may be chronic), and has a obvious source of infection, he meets sepsis criteria though not severe sepsis.  I am treating empirically.  As per my conversation with Dr. Marin Olp I considered and would actually prefer to not treat empirically until he can get to his tertiary care center when they can draw samples/cultures from the potential abscess and other infectious sources.  However, now that he is spiking  a fever I am concerned that his somewhat slow-growing and insidious process in the left leg has become systemic and he needs treatment while awaiting definitive care.  I talked with the patient and his wife about this and they understand and agree.  Code sepsis initiated and I am ordering a total of 30 mL/kg of IV LR based on ideal body weight.  I also ordered cefepime 2 g IV, metronidazole 500 mg IV, and  vancomycin per pharmacy consult. [CF]  0447 Completed sepsis reassessment.  Hemodynamically stable.  Patient still slightly tachycardic but receiving fluids.  Has gotten empiric antibiotics.  No mental status changes, is awake, alert, mood and affect are appropriate.  No new symptoms. [CF]  [phone] Still awaiting a callback from [facility].  My secretary has checked with them at least twice [CF]  0610 Consulted by phone with the [facility] transfer center, one of the hospitalist, and one of their orthopedic surgeons.  We had a joint conversation about the patient and I expressed my concern that the patient would benefit from transfer sooner rather than later.  They have no open beds at this time but I stressed that I am concerned about him and that waiting in the emergency department would lead to a worse outcome.  They are going to talk with the emergency department at [facility] and see if they could accept the patient as an ED to ED transfer and will call me back.  Of note, the orthopedic surgeon with whom I spoke (Dr. Theodoro Grist?)  Agree that the patient would need surgery and would like him at [facility], is just not certain about the best way to make that happen. [CF]  0705 I received word back from [facility] that Dr. Dion Body has accepted the patient for ED to ED transfer.  We are proceeding with transfer.  Given that he is hemodynamically stable, I think he will be appropriate for BLS transfer since he has no ongoing medication needs and fluids can be held. [CF]    Clinical Course User Index [CF] Loleta Rose, MD     FINAL CLINICAL IMPRESSION(S) / ED DIAGNOSES   Final diagnoses:  Sepsis, due to unspecified organism, unspecified whether acute organ dysfunction present [facility])  Abscess of left leg  Osteomyelitis of left femur, unspecified type (HCC)  Chronic respiratory failure, unspecified whether with hypoxia or hypercapnia (HCC)  Morbid obesity (HCC)     Rx / DC Orders   ED Discharge Orders     None        Note:   This document was prepared using Dragon voice recognition software and may include unintentional dictation errors.   Loleta Rose, MD 11/10/22 1324    Loleta Rose, MD 11/10/22 [phone]

## 2022-11-09 NOTE — ED Notes (Addendum)
First Nurse Note: Pt arrives via [facility] from home with complaints of L knee pain with swelling and warm sensation. Pt states he's had the pain for ~1 week. Non-ambulatory at baseline.   VS with EMS 99.1 135/74 116 2L [place] -94% 174- CBG

## 2022-11-09 NOTE — ED Notes (Signed)
Pt taken to the room via stretcher and attached to the monitor; primary RN made aware of the patients arrival.

## 2022-11-09 NOTE — ED Triage Notes (Signed)
Patient arrives with swelling to the left knee. Patient noticed swelling int he left knee a week ago. Pain is rated 7/10 in left knee with movement. Patient is bedbound. Patient is also hypoxic in triage at 88% on 2L, and 93% on 3L. Does not wear oxygen during the day.

## 2022-11-10 ENCOUNTER — Other Ambulatory Visit: Payer: Self-pay

## 2022-11-10 ENCOUNTER — Emergency Department: Payer: Medicaid Other

## 2022-11-10 LAB — RESP PANEL BY RT-PCR (RSV, FLU A&B, COVID)  RVPGX2
Influenza A by PCR: NEGATIVE
Influenza B by PCR: NEGATIVE
Resp Syncytial Virus by PCR: NEGATIVE
SARS Coronavirus 2 by RT PCR: NEGATIVE

## 2022-11-10 LAB — PROTIME-INR
INR: 1.7 — ABNORMAL HIGH (ref 0.8–1.2)
Prothrombin Time: 19.8 seconds — ABNORMAL HIGH (ref 11.4–15.2)

## 2022-11-10 LAB — APTT: aPTT: 36 seconds (ref 24–36)

## 2022-11-10 LAB — LACTIC ACID, PLASMA: Lactic Acid, Venous: 1.5 mmol/L (ref 0.5–1.9)

## 2022-11-10 LAB — TROPONIN I (HIGH SENSITIVITY): Troponin I (High Sensitivity): 6 ng/L (ref <18)

## 2022-11-10 MED ORDER — SODIUM CHLORIDE 0.9 % IV SOLN: INTRAVENOUS | Status: DC

## 2022-11-10 MED ORDER — LACTATED RINGERS IV SOLN: INTRAVENOUS | Status: DC

## 2022-11-10 MED ORDER — VANCOMYCIN HCL IN DEXTROSE 1-5 GM/200ML-% IV SOLN: 1000.0000 mg | Freq: Once | INTRAVENOUS | Status: DC

## 2022-11-10 MED ORDER — LACTATED RINGERS IV BOLUS (SEPSIS)
1000.0000 mL | Freq: Once | INTRAVENOUS | Status: AC
  Administered 2022-11-10: 1000 mL via INTRAVENOUS

## 2022-11-10 MED ORDER — SODIUM CHLORIDE 0.9 % IV SOLN
2.0000 g | Freq: Once | INTRAVENOUS | Status: AC
  Administered 2022-11-10: 2 g via INTRAVENOUS
  Filled 2022-11-10: qty 12.5

## 2022-11-10 MED ORDER — LACTATED RINGERS IV BOLUS (SEPSIS)
400.0000 mL | Freq: Once | INTRAVENOUS | Status: AC
  Administered 2022-11-10: 400 mL via INTRAVENOUS

## 2022-11-10 MED ORDER — IOHEXOL 350 MG/ML SOLN
100.0000 mL | Freq: Once | INTRAVENOUS | Status: AC | PRN
  Administered 2022-11-10: 100 mL via INTRAVENOUS

## 2022-11-10 MED ORDER — VANCOMYCIN HCL 2000 MG/400ML IV SOLN
2000.0000 mg | Freq: Once | INTRAVENOUS | Status: AC
  Administered 2022-11-10: 2000 mg via INTRAVENOUS
  Filled 2022-11-10: qty 400

## 2022-11-10 MED ORDER — ONDANSETRON HCL 4 MG/2ML IJ SOLN
4.0000 mg | INTRAMUSCULAR | Status: DC | PRN
  Administered 2022-11-10: 4 mg via INTRAVENOUS
  Filled 2022-11-10: qty 2

## 2022-11-10 MED ORDER — METRONIDAZOLE 500 MG/100ML IV SOLN
500.0000 mg | Freq: Once | INTRAVENOUS | Status: AC
  Administered 2022-11-10: 500 mg via INTRAVENOUS
  Filled 2022-11-10: qty 100

## 2022-11-10 MED ORDER — ACETAMINOPHEN 500 MG PO TABS
1000.0000 mg | ORAL_TABLET | Freq: Once | ORAL | Status: AC
  Administered 2022-11-10: 1000 mg via ORAL
  Filled 2022-11-10: qty 2

## 2022-11-10 MED ORDER — MORPHINE SULFATE (PF) 4 MG/ML IV SOLN
4.0000 mg | INTRAVENOUS | Status: DC | PRN
  Administered 2022-11-10 (×2): 4 mg via INTRAVENOUS
  Filled 2022-11-10 (×2): qty 1

## 2022-11-10 NOTE — ED Notes (Signed)
called to [facility] per MD Forbach/faxed facesheet/images power [email or web address]

## 2022-11-10 NOTE — Sepsis Progress Note (Signed)
Elink monitoring for the code sepsis protocol.  

## 2022-11-10 NOTE — ED Notes (Signed)
[facility] called for transport to [facility] ED

## 2022-11-10 NOTE — ED Notes (Signed)
Pt leaving with [facility] going to [facility] ED. Paper transfer consent signed by wife at [facility]. Alert, NAD, calm, interactive, resps e/u, speaking clearly.

## 2022-11-10 NOTE — Progress Notes (Signed)
CODE SEPSIS - PHARMACY COMMUNICATION  **Broad Spectrum Antibiotics should be administered within 1 hour of Sepsis diagnosis**  Time Code Sepsis Called/Page Received: 0245  Antibiotics Ordered: Cefepime, Flagyl, Vancomycin  Time of 1st antibiotic administration: 0316  Renda Rolls, PharmD, [facility] 11/10/2022 2:44 AM

## 2022-11-10 NOTE — ED Notes (Signed)
Called [facility] for update, Suanne Marker will repage Ortho

## 2022-11-10 NOTE — ED Notes (Signed)
Pt accepted to [facility] ED pe Suanne Marker, accepting is Rosalio Macadamia, call report to [phone]

## 2022-11-10 NOTE — Progress Notes (Signed)
PHARMACY -  BRIEF ANTIBIOTIC NOTE   Pharmacy has received consult(s) for Vancomycin from an ED provider.  The patient's profile has been reviewed for ht/wt/allergies/indication/available labs.    One time order(s) placed for Vancomycin 2 gm per pt wt > 100 kg.  Further antibiotics/pharmacy consults should be ordered by admitting physician if indicated.                       Thank you, Renda Rolls, PharmD, [facility] 11/10/2022 2:44 AM

## 2022-11-10 NOTE — ED Notes (Signed)
Called [facility] to page out Hospitalist, per Dr. Kelby Aline

## 2022-11-10 NOTE — ED Notes (Signed)
Called [facility] for update, still waiting for call back

## 2022-11-15 LAB — CULTURE, BLOOD (ROUTINE X 2)
Culture: NO GROWTH
Culture: NO GROWTH

## 2023-04-07 ENCOUNTER — Other Ambulatory Visit: Payer: Self-pay | Admitting: Infectious Diseases

## 2023-08-22 NOTE — Telephone Encounter (Signed)
 Created in error

## 2023-09-04 ENCOUNTER — Encounter: Payer: Medicaid Other | Attending: Pulmonary Disease | Admitting: *Deleted

## 2023-09-04 ENCOUNTER — Encounter: Payer: Self-pay | Admitting: *Deleted

## 2023-09-04 DIAGNOSIS — J8410 Pulmonary fibrosis, unspecified: Secondary | ICD-10-CM

## 2023-09-04 DIAGNOSIS — J9611 Chronic respiratory failure with hypoxia: Secondary | ICD-10-CM

## 2023-09-04 NOTE — Progress Notes (Signed)
 Virtual orientation call completed today. he has an appointment on Date: 09/11/2023  for EP eval and gym Orientation.  Documentation of diagnosis can be found in [facility] Date: 08/20/2023 Craig [name] is a current tobacco user. Intervention for tobacco cessation was provided at the initial medical review. He was asked about readiness to quit and reported he is not ready to quit.  . Patient was advised and educated about tobacco cessation using combination therapy, tobacco cessation classes, quit line, and quit smoking apps. Patient demonstrated understanding of this material. Staff will continue to provide encouragement and follow up with the patient throughout the program.  .

## 2023-09-11 ENCOUNTER — Ambulatory Visit: Payer: Medicaid Other

## 2023-09-16 ENCOUNTER — Ambulatory Visit: Payer: Medicaid Other

## 2023-09-23 ENCOUNTER — Ambulatory Visit: Payer: Medicaid Other

## 2023-09-30 ENCOUNTER — Ambulatory Visit: Payer: Medicaid Other

## 2023-11-04 ENCOUNTER — Ambulatory Visit

## 2023-12-11 ENCOUNTER — Emergency Department

## 2023-12-11 ENCOUNTER — Encounter: Payer: Self-pay | Admitting: *Deleted

## 2023-12-11 ENCOUNTER — Emergency Department
Admission: EM | Admit: 2023-12-11 | Discharge: 2023-12-12 | Disposition: A | Discharge status: Home / Self Care | Attending: Emergency Medicine | Admitting: Emergency Medicine

## 2023-12-11 ENCOUNTER — Other Ambulatory Visit: Payer: Self-pay

## 2023-12-11 DIAGNOSIS — F32A Depression, unspecified: Secondary | ICD-10-CM | POA: Diagnosis present

## 2023-12-11 DIAGNOSIS — Y903 Blood alcohol level of 60-79 mg/100 ml: Secondary | ICD-10-CM | POA: Insufficient documentation

## 2023-12-11 DIAGNOSIS — F1090 Alcohol use, unspecified, uncomplicated: Secondary | ICD-10-CM | POA: Diagnosis not present

## 2023-12-11 DIAGNOSIS — R2689 Other abnormalities of gait and mobility: Secondary | ICD-10-CM | POA: Diagnosis not present

## 2023-12-11 DIAGNOSIS — Z79899 Other long term (current) drug therapy: Secondary | ICD-10-CM | POA: Insufficient documentation

## 2023-12-11 DIAGNOSIS — I10 Essential (primary) hypertension: Secondary | ICD-10-CM | POA: Insufficient documentation

## 2023-12-11 DIAGNOSIS — F1290 Cannabis use, unspecified, uncomplicated: Secondary | ICD-10-CM | POA: Diagnosis not present

## 2023-12-11 DIAGNOSIS — F1010 Alcohol abuse, uncomplicated: Secondary | ICD-10-CM | POA: Insufficient documentation

## 2023-12-11 LAB — COMPREHENSIVE METABOLIC PANEL WITH GFR
ALT: 57 U/L — ABNORMAL HIGH (ref 0–44)
AST: 64 U/L — ABNORMAL HIGH (ref 15–41)
Albumin: 4.3 g/dL (ref 3.5–5.0)
Alkaline Phosphatase: 96 U/L (ref 38–126)
Anion gap: 10 (ref 5–15)
BUN: 8 mg/dL (ref 6–20)
CO2: 24 mmol/L (ref 22–32)
Calcium: 9.6 mg/dL (ref 8.9–10.3)
Chloride: 101 mmol/L (ref 98–111)
Creatinine, Ser: 0.71 mg/dL (ref 0.61–1.24)
GFR, Estimated: >60 mL/min (ref >60)
Glucose, Bld: 185 mg/dL — ABNORMAL HIGH (ref 70–99)
Potassium: 4.7 mmol/L (ref 3.5–5.1)
Sodium: 135 mmol/L (ref 135–145)
Total Bilirubin: 0.8 mg/dL (ref 0.0–1.2)
Total Protein: 8.3 g/dL — ABNORMAL HIGH (ref 6.5–8.1)

## 2023-12-11 LAB — URINE DRUG SCREEN, QUALITATIVE (ARMC ONLY)
Amphetamines, Ur Screen: NOT DETECTED
Barbiturates, Ur Screen: NOT DETECTED
Benzodiazepine, Ur Scrn: NOT DETECTED
Cannabinoid 50 Ng, Ur ~~LOC~~: POSITIVE — AB
Cocaine Metabolite,Ur ~~LOC~~: NOT DETECTED
MDMA (Ecstasy)Ur Screen: NOT DETECTED
Methadone Scn, Ur: NOT DETECTED
Opiate, Ur Screen: NOT DETECTED
Phencyclidine (PCP) Ur S: NOT DETECTED
Tricyclic, Ur Screen: NOT DETECTED

## 2023-12-11 LAB — SALICYLATE LEVEL: Salicylate Lvl: <7 mg/dL — ABNORMAL LOW (ref 7.0–30.0)

## 2023-12-11 LAB — ACETAMINOPHEN LEVEL: Acetaminophen (Tylenol), Serum: <10 ug/mL — ABNORMAL LOW (ref 10–30)

## 2023-12-11 LAB — CBC
HCT: 52.2 % — ABNORMAL HIGH (ref 39.0–52.0)
Hemoglobin: 17.3 g/dL — ABNORMAL HIGH (ref 13.0–17.0)
MCH: 31 pg (ref 26.0–34.0)
MCHC: 33.1 g/dL (ref 30.0–36.0)
MCV: 93.5 fL (ref 80.0–100.0)
Platelets: 192 10*3/uL (ref 150–400)
RBC: 5.58 MIL/uL (ref 4.22–5.81)
RDW: 14.5 % (ref 11.5–15.5)
WBC: 7.5 10*3/uL (ref 4.0–10.5)
nRBC: 0 % (ref 0.0–0.2)

## 2023-12-11 LAB — ETHANOL: Alcohol, Ethyl (B): 70 mg/dL — ABNORMAL HIGH (ref <15)

## 2023-12-11 MED ORDER — LORAZEPAM 2 MG PO TABS: 0.0000 mg | ORAL_TABLET | Freq: Two times a day (BID) | ORAL | Status: DC

## 2023-12-11 MED ORDER — LORAZEPAM 2 MG/ML IJ SOLN: 0.0000 mg | Freq: Two times a day (BID) | INTRAMUSCULAR | Status: DC

## 2023-12-11 MED ORDER — LORAZEPAM 1 MG PO TABS
1.0000 mg | ORAL_TABLET | Freq: Once | ORAL | Status: AC
  Administered 2023-12-11: 1 mg via ORAL
  Filled 2023-12-11: qty 1

## 2023-12-11 MED ORDER — NICOTINE 21 MG/24HR TD PT24
21.0000 mg | MEDICATED_PATCH | Freq: Every day | TRANSDERMAL | Status: DC
  Administered 2023-12-11 – 2023-12-12 (×2): 21 mg via TRANSDERMAL
  Filled 2023-12-11: qty 1

## 2023-12-11 MED ORDER — THIAMINE MONONITRATE 100 MG PO TABS
100.0000 mg | ORAL_TABLET | Freq: Every day | ORAL | Status: DC
  Administered 2023-12-11 – 2023-12-12 (×2): 100 mg via ORAL
  Filled 2023-12-11 (×2): qty 1

## 2023-12-11 MED ORDER — THIAMINE HCL 100 MG/ML IJ SOLN: 100.0000 mg | Freq: Every day | INTRAMUSCULAR | Status: DC

## 2023-12-11 NOTE — ED Notes (Addendum)
 Pt voided 800cc urine. Wife assisted with change out.  Pt is mostly bed ridden due to covid per wife.

## 2023-12-11 NOTE — ED Notes (Signed)
VOL  PENDING  PLACEMENT 

## 2023-12-11 NOTE — ED Notes (Signed)
 Declined breakfast, used phone to call wife.

## 2023-12-11 NOTE — ED Notes (Signed)
TTS in room.  

## 2023-12-11 NOTE — ED Notes (Signed)
 Pt brought in via ems from home  pt placed in hallway bed.  Pt admits etoh use.  Pt states my family want me out of the house because I drink .  Pt denies SI or HI.  Pt states I have 2 kids, I wanna live.  Pt refusing to dress out.  Pt calm.  Pt has flushed face, poor hygiene    pt denies drug use.  Pt reports he can not walk since he got covid.  Pt has had staph infections in both knees.

## 2023-12-11 NOTE — ED Triage Notes (Signed)
 Pt brought in  via ems from home for psych eval.   Pt is voluntary.   Pt reports feeling depressed.  Pt denies Si or HI.   Pt reports drinking 1/5 liquor tonight and drinks every day.  Pt calm.  Pt in hallway bed.

## 2023-12-11 NOTE — ED Notes (Signed)
 Pt declined snack,pt just wanted ice water 

## 2023-12-11 NOTE — BH Assessment (Signed)
 Comprehensive Clinical Assessment (CCA) Note  12/11/2023 Craig [name] [id number]  Chief Complaint: Patient is a 33 year old male presenting to [facility] ED voluntarily. Per triage note Pt brought in via ems from home for psych eval. Pt is voluntary. Pt reports feeling depressed. Pt denies Si or HI. Pt reports drinking 1/5 liquor tonight and drinks every day. Pt calm. During assessment patient appears alert and oriented x4, calm and cooperative. Patient reports "I want quit drinking and I guess I'm depressed." Patient reports "since 2020 I haven't been able to do anything, I haven't been able to get up because I got COVID and went into the ICU, I haven't been able to walk since then." Patient reports being able to walk with a walker but with assistance. "I used to be active, I used to go to my kids games and do things, I've just been stuck." Patient reports drinking daily "every night I drink a 5th of liquor all the way up until a half a gallon" he reports that his wife buys his alcohol for him. When asked about SI he reports "I don't want to kill myself, but I wish I didn't survive COVID." Patient denies current SI/HI/AH/VH.  Per Psyc NP Angela Mclauchlin patient is recommended for Inpatient  Chief Complaint  Patient presents with   Psychiatric Evaluation   Visit Diagnosis: Alcohol use disorder, severe. Depression     CCA Screening, Triage and Referral (STR)  Patient Reported Information How did you hear about us ? Self  Referral name: No data recorded Referral phone number: No data recorded  Whom do you see for routine medical problems? No data recorded Practice/Facility Name: No data recorded Practice/Facility Phone Number: No data recorded Name of Contact: No data recorded Contact Number: No data recorded Contact Fax Number: No data recorded Prescriber Name: No data recorded Prescriber Address (if known): No data recorded  What Is the Reason for Your Visit/Call Today? Pt brought  in  via ems from home for psych eval.   Pt is voluntary.   Pt reports feeling depressed.  Pt denies Si or HI.   Pt reports drinking 1/5 liquor tonight and drinks every day.  Pt calm.  Pt in hallway bed.  How Long Has This Been Causing You Problems? > than 6 months  What Do You Feel Would Help You the Most Today? Treatment for Depression or other mood problem   Have You Recently Been in Any Inpatient Treatment (Hospital/Detox/Crisis Center/28-Day Program)? No data recorded Name/Location of Program/Hospital:No data recorded How Long Were You There? No data recorded When Were You Discharged? No data recorded  Have You Ever Received Services From [facility] Before? No data recorded Who Do You See at [facility]? No data recorded  Have You Recently Had Any Thoughts About Hurting Yourself? No  Are You Planning to Commit Suicide/Harm Yourself At This time? No   Have you Recently Had Thoughts About Hurting Someone Marigene Shoulder? No  Explanation: No data recorded  Have You Used Any Alcohol or Drugs in the Past 24 Hours? Yes  How Long Ago Did You Use Drugs or Alcohol? No data recorded What Did You Use and How Much? Alcohol. 1/5 gallon of liquor   Do You Currently Have a Therapist/Psychiatrist? No  Name of Therapist/Psychiatrist: No data recorded  Have You Been Recently Discharged From Any Office Practice or Programs? No  Explanation of Discharge From Practice/Program: No data recorded    CCA Screening Triage Referral Assessment Type of Contact: Face-to-Face  Is this Initial or Reassessment? No data recorded Date Telepsych consult ordered in [facility]:  No data recorded Time Telepsych consult ordered in [facility]:  No data recorded  Patient Reported Information Reviewed? No data recorded Patient Left Without Being Seen? No data recorded Reason for Not Completing Assessment: No data recorded  Collateral Involvement: No data recorded  Does Patient Have a Court Appointed Legal Guardian? No data  recorded Name and Contact of Legal Guardian: No data recorded If Minor and Not Living with Parent(s), Who has Custody? No data recorded Is CPS involved or ever been involved? Never  Is APS involved or ever been involved? Never   Patient Determined To Be At Risk for Harm To Self or Others Based on Review of Patient Reported Information or Presenting Complaint? No  Method: No data recorded Availability of Means: No data recorded Intent: No data recorded Notification Required: No data recorded Additional Information for Danger to Others Potential: No data recorded Additional Comments for Danger to Others Potential: No data recorded Are There Guns or Other Weapons in Your Home? No  Types of Guns/Weapons: No data recorded Are These Weapons Safely Secured?                            No data recorded Who Could Verify You Are Able To Have These Secured: No data recorded Do You Have any Outstanding Charges, Pending Court Dates, Parole/Probation? No data recorded Contacted To Inform of Risk of Harm To Self or Others: No data recorded  Location of Assessment: [facility] ED   Does Patient Present under Involuntary Commitment? No  IVC Papers Initial File Date: No data recorded  [place] of Residence: [place]   Patient Currently Receiving the Following Services: No data recorded  Determination of Need: Emergent (2 hours)   Options For Referral: No data recorded    CCA Biopsychosocial Intake/Chief Complaint:  No data recorded Current Symptoms/Problems: No data recorded  Patient Reported Schizophrenia/Schizoaffective Diagnosis in Past: No   Strengths: Patient is able to communicate his needs  Preferences: No data recorded Abilities: No data recorded  Type of Services Patient Feels are Needed: No data recorded  Initial Clinical Notes/Concerns: No data recorded  Mental Health Symptoms Depression:  Change in energy/activity; Fatigue; Hopelessness; Weight gain/loss; Worthlessness    Duration of Depressive symptoms: Greater than two weeks   Mania:  None   Anxiety:   Worrying; Fatigue   Psychosis:  None   Duration of Psychotic symptoms: No data recorded  Trauma:  Avoids reminders of event; Emotional numbing   Obsessions:  None   Compulsions:  None   Inattention:  None   Hyperactivity/Impulsivity:  None   Oppositional/Defiant Behaviors:  None   Emotional Irregularity:  None   Other Mood/Personality Symptoms:  No data recorded   Mental Status Exam Appearance and self-care  Stature:  Average   Weight:  Overweight   Clothing:  Casual   Grooming:  Normal   Cosmetic use:  None   Posture/gait:  Normal   Motor activity:  Not Remarkable   Sensorium  Attention:  Normal   Concentration:  Normal   Orientation:  X5   Recall/memory:  Normal   Affect and Mood  Affect:  Appropriate; Depressed   Mood:  Depressed   Relating  Eye contact:  Normal   Facial expression:  Responsive   Attitude toward examiner:  Cooperative   Thought and Language  Speech flow: Clear and Coherent  Thought content:  Appropriate to Mood and Circumstances   Preoccupation:  None   Hallucinations:  None   Organization:  No data recorded  [employer] of Knowledge:  Fair   Intelligence:  Average   Abstraction:  Normal   Judgement:  Good   Reality Testing:  Realistic   Insight:  Good   Decision Making:  Normal   Social Functioning  Social Maturity:  Responsible   Social Judgement:  Normal   Stress  Stressors:  Illness; Other (Comment)   Coping Ability:  Exhausted   Skill Deficits:  Activities of daily living; Self-care   Supports:  Family     Religion: Religion/Spirituality Are You A Religious Person?: No  Leisure/Recreation: Leisure / Recreation Do You Have Hobbies?: No  Exercise/Diet: Exercise/Diet Do You Exercise?: No Have You Gained or Lost A Significant Amount of Weight in the Past Six Months?: No Do You Follow  a Special Diet?: No Do You Have Any Trouble Sleeping?: No   CCA Employment/Education Employment/Work Situation: Employment / Work Systems developer: On disability Why is Patient on Disability: Physical health How Long has Patient Been on Disability: Unknown Has Patient ever Been in the [employer]?: No  Education: Education Is Patient Currently Attending School?: No Did You Have An Individualized Education Program (IIEP): No Did You Have Any Difficulty At [employer]?: No Patient's Education Has Been Impacted by Current Illness: No   CCA Family/Childhood History Family and Relationship History: Family history Marital status: Married What types of issues is patient dealing with in the relationship?: None reported Additional relationship information: Unknown Does patient have children?: Yes How many children?: 2 How is patient's relationship with their children?: Patient has a good realationship with his children  Childhood History:  Childhood History Did patient suffer any verbal/emotional/physical/sexual abuse as a child?: No Did patient suffer from severe childhood neglect?: No Has patient ever been sexually abused/assaulted/raped as an adolescent or adult?: No Was the patient ever a victim of a crime or a disaster?: No Witnessed domestic violence?: No Has patient been affected by domestic violence as an adult?: No  Child/Adolescent Assessment:     CCA Substance Use Alcohol/Drug Use: Alcohol / Drug Use Pain Medications: see mar Prescriptions: see mar Over the Counter: see mar History of alcohol / drug use?: Yes Substance #1 Name of Substance 1: Alcohol 1 - Age of First Use: unknown 1 - Amount (size/oz): 1/5-1/2 gallon 1 - Frequency: daily 1 - Duration: unknown 1 - Last Use / Amount: 12/11/23 1- Route of Use: oral                       ASAM's:  Six Dimensions of Multidimensional Assessment  Dimension 1:  Acute Intoxication and/or Withdrawal  Potential:      Dimension 2:  Biomedical Conditions and Complications:      Dimension 3:  Emotional, Behavioral, or Cognitive Conditions and Complications:     Dimension 4:  Readiness to Change:     Dimension 5:  Relapse, Continued use, or Continued Problem Potential:     Dimension 6:  Recovery/Living Environment:     ASAM Severity Score:    ASAM Recommended Level of Treatment: ASAM Recommended Level of Treatment: Level III Residential Treatment   Substance use Disorder (SUD) Substance Use Disorder (SUD)  Checklist Symptoms of Substance Use: Continued use despite having a persistent/recurrent physical/psychological problem caused/exacerbated by use, Continued use despite persistent or recurrent social, interpersonal problems, caused or exacerbated by  use, Evidence of tolerance, Large amounts of time spent to obtain, use or recover from the substance(s), Persistent desire or unsuccessful efforts to cut down or control use, Presence of craving or strong urge to use, Recurrent use that results in a failure to fulfill major role obligations (work, school, home), Substance(s) often taken in larger amounts or over longer times than was intended  Recommendations for Services/Supports/Treatments:    DSM5 Diagnoses: Patient Active Problem List   Diagnosis Date Noted   Restless legs syndrome 03/24/2022   Avascular necrosis of bone (HCC), right knee 03/22/2022   Iron  deficiency anemia 03/22/2022   Infection of right knee (HCC) 03/21/2022   Obesity (BMI 30-39.9) 03/21/2022   Gout 03/21/2022   Nicotine  dependence 03/21/2022   DVT (deep venous thrombosis) (HCC) 03/21/2022   Electrolyte abnormality 08/29/2021   Chronic pain of right knee    Hypokalemia    AMS (altered mental status) 08/28/2021   Acute on chronic respiratory failure with hypoxia and hypercapnia (HCC) 08/10/2021   Acute on chronic respiratory failure with hypoxemia (HCC) 07/08/2021   Neuropathic pain, arm 10/20/2020   Pressure  injury of skin 09/18/2020   Staphylococcus aureus bacteremia    Fever    Metabolic encephalopathy    Acute respiratory failure with hypoxia (HCC)    Acute hypoxemic respiratory failure due to COVID-19 (HCC) 08/09/2020   Severe sepsis (HCC) 08/09/2020   AKI (acute kidney injury) (HCC) 08/09/2020   Hypertension    Leak of anastomosis between gastrointestinal structures 07/08/2020   Pneumoperitoneum 07/08/2020   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   Chronic gouty arthritis 02/17/2020   Class 2 severe obesity with serious comorbidity in adult [facility]) 02/17/2020   Diverticulitis of colon with perforation 11/22/2019    Patient Centered Plan: Patient is on the following Treatment Plan(s):  Depression and Substance Abuse   Referrals to Alternative Service(s): Referred to Alternative Service(s):   Place:   Date:   Time:    Referred to Alternative Service(s):   Place:   Date:   Time:    Referred to Alternative Service(s):   Place:   Date:   Time:    Referred to Alternative Service(s):   Place:   Date:   Time:      @[facility]LLABOFC[facility] @  [employer], LCAS-A

## 2023-12-11 NOTE — ED Notes (Addendum)
 Pt placed on 4 liters oxygen Craig [name].  Pt in hallway bed.

## 2023-12-11 NOTE — ED Provider Notes (Signed)
 [facility] Provider Note    Event Date/Time   First MD Initiated Contact with Patient 12/11/23 0154     (approximate)   History   Psychiatric Evaluation   HPI  Craig [name] is a 33 y.o. male brought to the ED via EMS from home with a chief complaint of depression, requesting behavioral medicine evaluation.  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  Denies history of DTs but states he does get tremulous when he stops drinking.  States family just wants to get rid of him due to his drinking.  Patient with a medically complex history of hypertension, bowel perforation, ostomy, history of DVT, obesity who states he is on trilogy 24/7.  Currently wearing oxygen.  Denies HI/AH/VH.  Voices no medical complaints.  Specifically, denies chest pain, shortness of breath, abdominal pain, nausea, vomiting or dizziness.     Past Medical History   Past Medical History:  Diagnosis Date   Gout    Hypertension    Rupture of bowel [facility])      Active Problem List   Patient Active Problem List   Diagnosis Date Noted   Restless legs syndrome 03/24/2022   Avascular necrosis of bone (HCC), right knee 03/22/2022   Iron  deficiency anemia 03/22/2022   Infection of right knee (HCC) 03/21/2022   Obesity (BMI 30-39.9) 03/21/2022   Gout 03/21/2022   Nicotine  dependence 03/21/2022   DVT (deep venous thrombosis) (HCC) 03/21/2022   Electrolyte abnormality 08/29/2021   Chronic pain of right knee    Hypokalemia    AMS (altered mental status) 08/28/2021   Acute on chronic respiratory failure with hypoxia and hypercapnia (HCC) 08/10/2021   Acute on chronic respiratory failure with hypoxemia (HCC) 07/08/2021   Neuropathic pain, arm 10/20/2020   Pressure injury of skin 09/18/2020   Staphylococcus aureus bacteremia    Fever    Metabolic encephalopathy    Acute respiratory failure with hypoxia (HCC)    Acute hypoxemic respiratory failure  due to COVID-19 (HCC) 08/09/2020   Severe sepsis (HCC) 08/09/2020   AKI (acute kidney injury) (HCC) 08/09/2020   Hypertension    Leak of anastomosis between gastrointestinal structures 07/08/2020   Pneumoperitoneum 07/08/2020   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   Chronic gouty arthritis 02/17/2020   Class 2 severe obesity with serious comorbidity in adult [facility]) 02/17/2020   Diverticulitis of colon with perforation 11/22/2019     Past Surgical History   Past Surgical History:  Procedure Laterality Date   BONE BIOPSY Right 03/22/2022   Procedure: BONE BIOPSY OF TIBIA  FIBULA;  Surgeon: Molli Angelucci, MD;  Location: [facility] ORS;  Service: Orthopedics;  Laterality: Right;   COLONOSCOPY WITH PROPOFOL  N/A 05/18/2020   Procedure: COLONOSCOPY WITH PROPOFOL ;  Surgeon: Conrado Delay, DO;  Location: [facility] ENDOSCOPY;  Service: General;  Laterality: N/A;   COLOSTOMY N/A 11/22/2019   Procedure: COLOSTOMY;  Surgeon: Eldred Grego, MD;  Location: [facility] ORS;  Service: General;  Laterality: N/A;   KNEE ARTHROSCOPY Right 07/11/2021   Procedure: SYNOVIAL BIOPSY AND ARTHROSCOPIC IRRGATION AND DEBRIDEMENT OF SEPTIC KNEE;  Surgeon: Molli Angelucci, MD;  Location: [facility] ORS;  Service: Orthopedics;  Laterality: Right;   KNEE ARTHROSCOPY Right 07/18/2021   Procedure: ARTHROSCOPY KNEE, IRRIGATION AND DEBRIDEMENT;  Surgeon: Molli Angelucci, MD;  Location: [facility] ORS;  Service: Orthopedics;  Laterality: Right;   KNEE ARTHROSCOPY Right 09/01/2021   Procedure: ARTHROSCOPIC DEBRIDEMENT OF KNEE WITH SYNOVIUM BIOPSY;  Surgeon: Molli Angelucci, MD;  Location:  [facility] ORS;  Service: Orthopedics;  Laterality: Right;   LAPAROTOMY N/A 11/22/2019   Procedure: EXPLORATORY LAPAROTOMY;  Surgeon: Eldred Grego, MD;  Location: [facility] ORS;  Service: General;  Laterality: N/A;   LAPAROTOMY N/A 07/08/2020   Procedure: EXPLORATORY LAPAROTOMY;  Surgeon: Eldred Grego, MD;  Location: [facility] ORS;  Service: General;  Laterality: N/A;   OPEN  REDUCTION INTERNAL FIXATION (ORIF) TIBIA/FIBULA FRACTURE Right 03/22/2022   Procedure: OPEN REDUCTION INTERNAL FIXATION (ORIF) TIBIA/FIBULA FRACTURE-BONE BIOPSY;  Surgeon: Molli Angelucci, MD;  Location: [facility] ORS;  Service: Orthopedics;  Laterality: Right;  Femur/Tibia bone biopsy procedure   PARTIAL COLECTOMY N/A 11/22/2019   Procedure: PARTIAL COLECTOMY;  Surgeon: Eldred Grego, MD;  Location: [facility] ORS;  Service: General;  Laterality: N/A;   PEG PLACEMENT N/A 09/14/2020   Procedure: PERCUTANEOUS ENDOSCOPIC GASTROSTOMY (PEG) PLACEMENT;  Surgeon: Conrado Delay, DO;  Location: [facility] ENDOSCOPY;  Service: General;  Laterality: N/A;  TRAVEL CASE TRACH COVID POSITIVE   SYNOVIAL BIOPSY Right 07/11/2021   Procedure: SYNOVIAL BIOPSY;  Surgeon: Molli Angelucci, MD;  Location: [facility] OR[facility];  Service: Orthopedics;  Laterality: Right;   TRACHEOSTOMY TUBE PLACEMENT N/A 09/13/2020   Procedure: TRACHEOSTOMY;  Surgeon: Lesly Raspberry, MD;  Location: [facility] ORS;  Service: ENT;  Laterality: N/A;   XI ROBOTIC ASSISTED COLOSTOMY TAKEDOWN N/A 06/28/2020   Procedure: XI ROBOTIC ASSISTED COLOSTOMY TAKEDOWN CONVERTED TO OPEN PROCEDURE;  Surgeon: Eldred Grego, MD;  Location: [facility] ORS;  Service: General;  Laterality: N/A;     Home Medications   Prior to Admission medications   Medication Sig Start Date End Date Taking? Authorizing Provider  acetaminophen  (TYLENOL ) 650 MG CR tablet Take 650 mg by mouth every 8 (eight) hours as needed. 10/14/20   [provider]  albuterol  (ACCUNEB ) 1.25 MG/3ML nebulizer solution Take by nebulization. 02/23/22   [provider]  Allopurinol  200 MG TABS Take 400 mg by mouth daily. 02/23/22   [provider]  ascorbic acid  (VITAMIN C ) 500 MG tablet Take 2 tablets (1,000 mg total) by mouth at bedtime. 03/26/22   Sheril Dines, MD  blood glucose meter kit and supplies KIT Dispense based on patient and insurance preference. Use up to four times daily as directed.  07/26/21   Ree Candy, MD  budesonide  (PULMICORT ) 0.25 MG/2ML nebulizer solution Take 0.25 mg by nebulization 2 (two) times daily. 03/07/21 03/07/22  [provider]  cephALEXin  (KEFLEX ) 500 MG capsule Take 2 capsules (1,000 mg total) by mouth 4 (four) times daily. 09/09/22   Alica Inks, MD  cetirizine  (ZYRTEC ) 10 MG tablet Take 10 mg by mouth daily.    [provider]  Cholecalciferol  (VITAMIN D3) 25 MCG (1000 UT) CAPS Take 1 capsule by mouth daily.    [provider]  clonazePAM  (KLONOPIN ) 1 MG tablet Take 1 mg by mouth 2 (two) times daily as needed. 09/24/21   [provider]  clotrimazole  (LOTRIMIN ) 1 % cream Apply topically 2 (two) times daily. 08/23/21   Lesa Rape, MD  colchicine  0.6 MG tablet Take 0.6 mg by mouth daily. 09/25/21   [provider]  [employer] SALINE NASAL SPRAY 0.65 % nasal spray SMARTSIG:1 Spray(s) Both Nares Every 4 Hours PRN 10/14/20   [provider]  cyanocobalamin  (VITAMIN B12) 1000 MCG tablet Take 1,000 mcg by mouth daily.    [provider]  diclofenac Sodium (VOLTAREN) 1 % GEL Apply 1 Application topically 4 (four) times daily. 01/30/22   [provider]  docusate sodium  (COLACE) 100 MG capsule Take 1  capsule (100 mg total) by mouth 2 (two) times daily as needed for mild constipation. 08/23/21   Lesa Rape, MD  DULoxetine  (CYMBALTA ) 60 MG capsule Take 60 mg by mouth daily. 11/14/20 03/21/22  [provider]  famotidine  (PEPCID ) 20 MG tablet Take 20 mg by mouth 2 (two) times daily as needed. 10/14/20   [provider]  ferrous sulfate  325 (65 FE) MG tablet Take 1 tablet (325 mg total) by mouth every other day. 03/28/22   Sheril Dines, MD  folic acid  (FOLVITE ) 1 MG tablet Take 1 mg by mouth daily. 03/16/22   [provider]  furosemide  (LASIX ) 40 MG tablet Take 40 mg by mouth 2 (two) times daily. 07/23/22   [provider]  hydroxychloroquine  (PLAQUENIL ) 200 MG tablet  Take 200 mg by mouth 2 (two) times daily. 02/23/22   [provider]  lidocaine  (LIDODERM ) 5 % Place onto the skin.    [provider]  magnesium  oxide (MAG-OX) 400 (240 Mg) MG tablet Take 1 tablet (400 mg total) by mouth 2 (two) times daily. 09/03/21   Patel, Sona, MD  melatonin 3 MG TABS tablet Take 6 mg by mouth at bedtime as needed. 10/14/20   [provider]  metoprolol  succinate (TOPROL -XL) 50 MG 24 hr tablet Take 3 tablets (150 mg total) by mouth daily. Take with or immediately following a meal. 09/04/21   Patel, Sona, MD  Multiple Vitamins-Minerals (MULTIVITAMIN WITH MINERALS) tablet Take 1 tablet by mouth daily.    [provider]  nicotine  (NICODERM CQ  - DOSED IN MG/24 HOURS) 21 mg/24hr patch Place 1 patch (21 mg total) onto the skin daily. Patient taking differently: Place 21 mg onto the skin daily as needed. As needed for hospital admission. 07/26/21   Swayze, Ava, DO  nystatin  (MYCOSTATIN /NYSTOP ) powder Apply topically 3 (three) times daily as needed. 03/26/22   Sheril Dines, MD  omeprazole (PRILOSEC) 20 MG capsule Take 1 capsule (20 mg total) by mouth once daily 09/27/21 09/27/22  [provider]  ondansetron  (ZOFRAN ) 4 MG tablet Take 4 mg by mouth every 8 (eight) hours as needed. 10/14/20   [provider]  ondansetron  (ZOFRAN -ODT) 4 MG disintegrating tablet Take 4 mg by mouth every 8 (eight) hours as needed.    [provider]  oxyCODONE  (OXY IR/ROXICODONE ) 5 MG immediate release tablet Take 5 mg by mouth every 4 (four) hours as needed. 03/18/22   [provider]  OXYGEN 2.5 LPM [place] PRN    [provider]  polyethylene glycol (MIRALAX  / GLYCOLAX ) 17 g packet Take 17 g by mouth daily. 07/25/21   Swayze, Ava, DO  potassium chloride  SA (KLOR-CON  M) 20 MEQ tablet Take 1 tablet (20 mEq total) by mouth 2 (two) times daily. 09/03/21   Patel, Sona, MD  pregabalin  (LYRICA ) 200 MG capsule Take 200 mg by mouth 2 (two) times daily.  03/01/21 03/21/22  [provider]  rivaroxaban  (XARELTO ) 20 MG TABS tablet Take 20 mg by mouth daily with supper. 11/07/20   [provider]  rOPINIRole  (REQUIP ) 0.25 MG tablet Take 1 tablet (0.25 mg total) by mouth at bedtime. 03/26/22   Sheril Dines, MD  saccharomyces boulardii (FLORASTOR) 250 MG capsule Take 250 mg by mouth 2 (two) times daily.    [provider]  zinc  sulfate 220 (50 Zn) MG capsule Take 220 mg by mouth daily.    [provider]     Allergies  Fentanyl  citrate, Tramadol  hcl, and Amoxicillin  Family History   Family History  Problem Relation Age of Onset   Healthy Mother    Healthy Father      Physical Exam  Triage Vital Signs: ED Triage Vitals  Encounter Vitals Group     BP      Systolic BP Percentile      Diastolic BP Percentile      Pulse      Resp      Temp      Temp src      SpO2      Weight      Height      Head Circumference      Peak Flow      Pain Score      Pain Loc      Pain Education      Exclude from Growth Chart     Updated Vital Signs: BP 122/70   Pulse (!) 135   Temp 98.6 F (37 C) (Oral)   Resp 18   Ht 6' (1.829 m)   Wt 127 kg   SpO2 (!) 88%   BMI 37.97 kg/m    General: Awake, no distress.  CV:  Tachycardic.  Good peripheral perfusion.  Resp:  Normal effort.  CTAB. Abd:  Obese, nontender.  No distention.  Other:  Cooperative.   ED Results / Procedures / Treatments  Labs (all labs ordered are listed, but only abnormal results are displayed) Labs Reviewed  COMPREHENSIVE METABOLIC PANEL WITH GFR - Abnormal; Notable for the following components:      Result Value   Glucose, Bld 185 (*)    Total Protein 8.3 (*)    AST 64 (*)    ALT 57 (*)    All other components within normal limits  ETHANOL - Abnormal; Notable for the following components:   Alcohol, Ethyl (B) 70 (*)    All other components within normal limits  SALICYLATE LEVEL - Abnormal; Notable for the following  components:   Salicylate Lvl <7.0 (*)    All other components within normal limits  ACETAMINOPHEN  LEVEL - Abnormal; Notable for the following components:   Acetaminophen  (Tylenol ), Serum <10 (*)    All other components within normal limits  CBC - Abnormal; Notable for the following components:   Hemoglobin 17.3 (*)    HCT 52.2 (*)    All other components within normal limits  URINE DRUG SCREEN, QUALITATIVE ([facility] ONLY) - Abnormal; Notable for the following components:   Cannabinoid 50 Ng, Ur [place] POSITIVE (*)    All other components within normal limits     EKG  None   RADIOLOGY I have independently visualized and interpreted patient's imaging study as well as noted the radiology interpretation:  Chest x-ray: No acute cardiopulmonary process  Official radiology report(s): DG Chest 1 View Result Date: 12/11/2023 CLINICAL DATA:  Hypoxia EXAM: CHEST  1 VIEW COMPARISON:  08/28/2021 FINDINGS: No focal airspace consolidation. Unchanged chronic streaky opacity in the medial right lung. Normal cardiomediastinal contours. No pleural effusion or pneumothorax. IMPRESSION: No acute airspace disease. Electronically Signed   By: Juanetta Nordmann M.D.   On: 12/11/2023 03:41     PROCEDURES:  Critical Care performed: No  Procedures   MEDICATIONS ORDERED IN ED: Medications  LORazepam  (ATIVAN ) injection 0-4 mg (has no administration in time range)    Or  LORazepam  (ATIVAN ) tablet 0-4 mg (has no administration in time range)  thiamine  (VITAMIN B1) tablet 100 mg (has no administration in time range)  Or  thiamine  (VITAMIN B1) injection 100 mg (has no administration in time range)  nicotine  (NICODERM CQ  - dosed in mg/24 hours) patch 21 mg (has no administration in time range)     IMPRESSION / MDM / ASSESSMENT AND PLAN / ED COURSE  I reviewed the triage vital signs and the nursing notes.                             33 year old male who presents for depression.  Triage saturations taken  while patient was taking a break from his oxygen.  With his baseline oxygen, saturations are 95%.  Laboratory and chest x-ray results unremarkable except cannabinoids and elevated EtOH.  Patient is medically cleared for psychiatric evaluation and disposition.  Contracts for safety in the emergency department.  Patient's presentation is most consistent with acute complicated illness / injury requiring diagnostic workup.  The patient has been placed in psychiatric observation due to the need to provide a safe environment for the patient while obtaining psychiatric consultation and evaluation, as well as ongoing medical and medication management to treat the patient's condition.  The patient has not been placed under full IVC at this time.    FINAL CLINICAL IMPRESSION(S) / ED DIAGNOSES   Final diagnoses:  Depression, unspecified depression type  Marijuana use     Rx / DC Orders   ED Discharge Orders     None        Note:  This document was prepared using Dragon voice recognition software and may include unintentional dictation errors.   Shawntina Diffee J, MD 12/11/23 [phone]

## 2023-12-11 NOTE — ED Notes (Signed)
 Patient given dinner tray at this time.

## 2023-12-11 NOTE — BH Assessment (Signed)
 Per [facility] Burd[place] Carolin), patient to be referred out of system.   Referral information for Psychiatric Hospitalization faxed to;  Service Provider Phone  [facility] [phone]  [facility] Patient Placement [phone]  [facility] [place] [facility] [phone]  [facility] [phone]  [facility] [phone]  [facility] [phone]  [facility] [phone]  [facility] [phone]  [facility] [phone]  [facility] [phone]  [facility] [phone]  [facility] [phone]  [facility]FA[facility] [phone]  [facility] [phone]  [facility] [phone]  [facility] [phone]  [facility] [phone]

## 2023-12-11 NOTE — ED Notes (Signed)
 Wife visiting with pt in hallway now.

## 2023-12-11 NOTE — ED Notes (Signed)
 Pt lunch provided at bedside

## 2023-12-11 NOTE — ED Notes (Signed)
 Patient's ostomy bag emptied at this time. Patient calm and cooperative throughout the process. Patient denies other needs at this time.

## 2023-12-11 NOTE — Consult Note (Signed)
 [facility] Psychiatric Consult Initial  Patient Name: .Craig [name]  MRN: [id number]  DOB: 1991-05-02  Consult Order details:  Orders (From admission, onward)     Start     Ordered   12/11/23 0206  IP CONSULT TO PSYCHIATRY       Ordering Provider: Norlene Beavers, MD  Provider:  (Not yet assigned)  Question Answer Comment  Place call to: psychiatry   Reason for Consult Consult   Diagnosis/Clinical Info for Consult: depression      12/11/23 0205   12/11/23 0205  CONSULT TO CALL ACT TEAM       Ordering Provider: Norlene Beavers, MD  Provider:  (Not yet assigned)  Question:  Reason for Consult?  Answer:  Psych consult   12/11/23 0205             Mode of Visit: Tele-visit Virtual Statement:TELE PSYCHIATRY ATTESTATION & CONSENT As the provider for this telehealth consult, I attest that I verified the patient's identity using two separate identifiers, introduced myself to the patient, provided my credentials, disclosed my location, and performed this encounter via a HIPAA-compliant, real-time, face-to-face, two-way, interactive audio and video platform and with the full consent and agreement of the patient (or guardian as applicable.) Patient physical location: [facility]. Telehealth provider physical location: home office in state of [place] .   Video start time:   Video end time:      Psychiatry Consult Evaluation  Service Date: December 11, 2023 LOS:  LOS: 0 days  Chief Complaint "Help to stop drinking"  Primary Psychiatric Diagnoses  Alcohol use disorder 2.  Depression  Assessment  Craig [name] is a 33 y.o. male admitted: Presented to the [facility] 12/11/2023  1:42 AM requesting alcohol detox. He carries the psychiatric diagnoses of Alcohol use disorder and has a past medical history of  Severe; Major Depressive Disorder, Recurrent, Moderate and has a past medical history of severe COVID-19 with prolonged ICU stay and post-ICU debility requiring  walker use for mobility.  His current presentation of alcohol dependence with depressive symptoms is most consistent with co-occurring Alcohol Use Disorder and Major Depressive Disorder. He meets criteria for Alcohol Use Disorder, Severe based on daily consumption, withdrawal symptoms, and inability to stop despite adverse consequences. Current outpatient psychotropic medications include none reported, and historically he has had no formal psychiatric treatment. He was not engaged in prior psychiatric care prior to admission as evidenced by patient self-report.  On initial examination, patient appears medically and psychiatrically stable but in need of a medically supervised detox and initiation of substance use treatment. Depression is evident and linked to situational stressors and medical decline.    Diagnoses:  Active Hospital problems: Active Problems:   * No active hospital problems. *    Plan   ## Psychiatric Medication Recommendations:  Start Ativan  taper ; CIWA  ## Medical Decision Making Capacity: Not specifically addressed in this encounter   ## Disposition:-- There are no psychiatric contraindications to discharge at this time  ## Behavioral / Environmental: - No specific recommendations at this time.     ## Safety and Observation Level:  - Based on my clinical evaluation, I estimate the patient to be at low risk of self harm in the current setting. - At this time, we recommend  routine. This decision is based on my review of the chart including patient's history and current presentation, interview of the patient, mental status examination, and consideration of suicide risk including  evaluating suicidal ideation, plan, intent, suicidal or self-harm behaviors, risk factors, and protective factors. This judgment is based on our ability to directly address suicide risk, implement suicide prevention strategies, and develop a safety plan while the patient is in the clinical  setting. Please contact our team if there is a concern that risk level has changed.  CSSR Risk Category:C-SSRS RISK CATEGORY: No Risk  Suicide Risk Assessment: Patient has following modifiable risk factors for suicide: access to guns and untreated depression, which we are addressing by inpatient admission. Patient has following non-modifiable or demographic risk factors for suicide: male gender Patient has the following protective factors against suicide: Minor children in the home  Thank you for this consult request. Recommendations have been communicated to the primary team.  We will recommend inpatient at this time.   Craig Norman, NP       History of Present Illness  Relevant Aspects of Hospital ED Course:  Admitted on 12/11/2023 for Depression and Alcohol Abuse.   Patient Report:  Craig [name] is a male patient presenting for psychiatric evaluation with a primary concern of alcohol use disorder. He reports drinking heavily every night, consuming between a fifth to a gallon of liquor. He states he attempted to stop drinking recently but experienced withdrawal symptoms including jitteriness, irritability, sweating, and nightmares after three days of abstinence. He is requesting help to stop drinking.  The patient reports significant functional decline following a severe COVID-19 infection, including ICU admission. He now requires an upright walker for ambulation and becomes short of breath (SOB) with short distances. He expresses depressive symptoms associated with his limited mobility and the loss of his father while he was hospitalized.  While he denies suicidal ideation (SI), he admits to wishing he had died during his ICU stay. He reports there are firearms in the home, but they are secured in a safe. He denies homicidal ideation (HI), auditory or visual hallucinations (AVH), and any other substance use. He lives with his wife and two children.  Psych ROS:  Depression:  yes Anxiety:  no Mania (lifetime and current): no Psychosis: (lifetime and current): no   Review of Systems  Constitutional: Negative.   HENT: Negative.    Eyes: Negative.   Respiratory: Negative.    Cardiovascular: Negative.   Gastrointestinal: Negative.   Genitourinary: Negative.   Musculoskeletal:  Positive for joint pain.  Skin: Negative.   Neurological: Negative.   Psychiatric/Behavioral:  Positive for depression and substance abuse.      Psychiatric and Social History  Psychiatric History:  Information collected from Patient  Prev Dx/Sx: Depression Current Psych Provider: none Home Meds (current): unknown Previous Med Trials: unknown Therapy: none  Prior Psych Hospitalization: no  Prior Self Harm: no Prior Violence: no  Family Psych History: none Family Hx suicide: none  Social History:  Developmental Hx: unknown Educational Hx: Unknown Occupational Hx: Banker Hx: none Living Situation: with wife and 2 children Spiritual Hx: unknown Access to weapons/lethal means: yes   Substance History Alcohol: yes  Type of alcohol liquor Last Drink yesterday Number of drinks per day 5th to a gallon History of alcohol withdrawal seizures no History of DT's no Tobacco: no Illicit drugs: no Prescription drug abuse: no Rehab hx: no  Exam Findings   Vital Signs:  Temp:  [98.6 F (37 C)] 98.6 F (37 C) (04/23 0213) Pulse Rate:  [135] 135 (04/23 0213) Resp:  [18] 18 (04/23 0213) BP: (122)/(70) 122/70 (04/23 0213) SpO2:  [88 %]  88 % (04/23 0213) Weight:  [578 kg] 127 kg (04/23 0201) Blood pressure 122/70, pulse (!) 135, temperature 98.6 F (37 C), temperature source Oral, resp. rate 18, height 6' (1.829 m), weight 127 kg, SpO2 (!) 88%. Body mass index is 37.97 kg/m.  Physical Exam HENT:     Head: Normocephalic.     Nose: Nose normal.  Eyes:     Extraocular Movements: Extraocular movements intact.  Pulmonary:     Effort: Pulmonary effort is normal.   Musculoskeletal:        General: Normal range of motion.     Cervical back: Normal range of motion.  Skin:    General: Skin is warm.  Neurological:     General: No focal deficit present.     Mental Status: He is alert.        Other History   These have been pulled in through the EMR, reviewed, and updated if appropriate.  Family History:  The patient's family history includes Healthy in his father and mother.  Medical History: Past Medical History:  Diagnosis Date  . Gout   . Hypertension   . Rupture of bowel [facility])     Surgical History: Past Surgical History:  Procedure Laterality Date  . BONE BIOPSY Right 03/22/2022   Procedure: BONE BIOPSY OF TIBIA  FIBULA;  Surgeon: Molli Angelucci, MD;  Location: [facility] ORS;  Service: Orthopedics;  Laterality: Right;  . COLONOSCOPY WITH PROPOFOL  N/A 05/18/2020   Procedure: COLONOSCOPY WITH PROPOFOL ;  Surgeon: Conrado Delay, DO;  Location: [facility] ENDOSCOPY;  Service: General;  Laterality: N/A;  . COLOSTOMY N/A 11/22/2019   Procedure: COLOSTOMY;  Surgeon: Eldred Grego, MD;  Location: [facility] ORS;  Service: General;  Laterality: N/A;  . KNEE ARTHROSCOPY Right 07/11/2021   Procedure: SYNOVIAL BIOPSY AND ARTHROSCOPIC IRRGATION AND DEBRIDEMENT OF SEPTIC KNEE;  Surgeon: Molli Angelucci, MD;  Location: [facility] ORS;  Service: Orthopedics;  Laterality: Right;  . KNEE ARTHROSCOPY Right 07/18/2021   Procedure: ARTHROSCOPY KNEE, IRRIGATION AND DEBRIDEMENT;  Surgeon: Molli Angelucci, MD;  Location: [facility] OR[facility];  Service: Orthopedics;  Laterality: Right;  . KNEE ARTHROSCOPY Right 09/01/2021   Procedure: ARTHROSCOPIC DEBRIDEMENT OF KNEE WITH SYNOVIUM BIOPSY;  Surgeon: Molli Angelucci, MD;  Location: [facility] OR[facility];  Service: Orthopedics;  Laterality: Right;  . LAPAROTOMY N/A 11/22/2019   Procedure: EXPLORATORY LAPAROTOMY;  Surgeon: Eldred Grego, MD;  Location: [facility];  Service: General;  Laterality: N/A;  . LAPAROTOMY N/A 07/08/2020   Procedure: EXPLORATORY  LAPAROTOMY;  Surgeon: Eldred Grego, MD;  Location: [facility] OR[facility];  Service: General;  Laterality: N/A;  . OPEN REDUCTION INTERNAL FIXATION (ORIF) TIBIA/FIBULA FRACTURE Right 03/22/2022   Procedure: OPEN REDUCTION INTERNAL FIXATION (ORIF) TIBIA/FIBULA FRACTURE-BONE BIOPSY;  Surgeon: Molli Angelucci, MD;  Location: [facility];  Service: Orthopedics;  Laterality: Right;  Femur/Tibia bone biopsy procedure  . PARTIAL COLECTOMY N/A 11/22/2019   Procedure: PARTIAL COLECTOMY;  Surgeon: Eldred Grego, MD;  Location: [facility];  Service: General;  Laterality: N/A;  . PEG PLACEMENT N/A 09/14/2020   Procedure: PERCUTANEOUS ENDOSCOPIC GASTROSTOMY (PEG) PLACEMENT;  Surgeon: Conrado Delay, DO;  Location: [facility];  Service: General;  Laterality: N/A;  TRAVEL CASE TRACH COVID POSITIVE  . SYNOVIAL BIOPSY Right 07/11/2021   Procedure: SYNOVIAL BIOPSY;  Surgeon: Molli Angelucci, MD;  Location: [facility];  Service: Orthopedics;  Laterality: Right;  . TRACHEOSTOMY TUBE PLACEMENT N/A 09/13/2020   Procedure: TRACHEOSTOMY;  Surgeon: Lesly Raspberry, MD;  Location: [facility];  Service: ENT;  Laterality: N/A;  . XI ROBOTIC ASSISTED  COLOSTOMY TAKEDOWN N/A 06/28/2020   Procedure: XI ROBOTIC ASSISTED COLOSTOMY TAKEDOWN CONVERTED TO OPEN PROCEDURE;  Surgeon: Eldred Grego, MD;  Location: [facility];  Service: General;  Laterality: N/A;     Medications:   Current Facility-Administered Medications:  .  [START ON 12/13/2023] LORazepam  (ATIVAN ) injection 0-4 mg, 0-4 mg, Intravenous, Q12H **OR** [START ON 12/13/2023] LORazepam  (ATIVAN ) tablet 0-4 mg, 0-4 mg, Oral, Q12H, Sung, Jade J, MD .  nicotine  (NICODERM CQ  - dosed in mg/24 hours) patch 21 mg, 21 mg, Transdermal, Daily, Sung, Jade J, MD .  thiamine  (VITAMIN B1) tablet 100 mg, 100 mg, Oral, Daily **OR** thiamine  (VITAMIN B1) injection 100 mg, 100 mg, Intravenous, Daily, Sung, Jade J, MD  Current Outpatient Medications:  .  acetaminophen  (TYLENOL ) 650 MG CR tablet,  Take 650 mg by mouth every 8 (eight) hours as needed., Disp: , Rfl:  .  albuterol  (ACCUNEB ) 1.25 MG/3ML nebulizer solution, Take by nebulization., Disp: , Rfl:  .  Allopurinol  200 MG TABS, Take 400 mg by mouth daily., Disp: , Rfl:  .  ascorbic acid  (VITAMIN C ) 500 MG tablet, Take 2 tablets (1,000 mg total) by mouth at bedtime., Disp: , Rfl:  .  blood glucose meter kit and supplies KIT, Dispense based on patient and insurance preference. Use up to four times daily as directed., Disp: 1 each, Rfl: 0 .  budesonide  (PULMICORT ) 0.25 MG/2ML nebulizer solution, Take 0.25 mg by nebulization 2 (two) times daily., Disp: , Rfl:  .  cephALEXin  (KEFLEX ) 500 MG capsule, Take 2 capsules (1,000 mg total) by mouth 4 (four) times daily., Disp: 120 capsule, Rfl: 0 .  cetirizine  (ZYRTEC ) 10 MG tablet, Take 10 mg by mouth daily., Disp: , Rfl:  .  Cholecalciferol  (VITAMIN D3) 25 MCG (1000 UT) CAPS, Take 1 capsule by mouth daily., Disp: , Rfl:  .  clonazePAM  (KLONOPIN ) 1 MG tablet, Take 1 mg by mouth 2 (two) times daily as needed., Disp: , Rfl:  .  clotrimazole  (LOTRIMIN ) 1 % cream, Apply topically 2 (two) times daily., Disp: 30 g, Rfl: 0 .  colchicine  0.6 MG tablet, Take 0.6 mg by mouth daily., Disp: , Rfl:  .  [employer] SALINE NASAL SPRAY 0.65 % nasal spray, SMARTSIG:1 Spray(s) Both Nares Every 4 Hours PRN, Disp: , Rfl:  .  cyanocobalamin  (VITAMIN B12) 1000 MCG tablet, Take 1,000 mcg by mouth daily., Disp: , Rfl:  .  diclofenac Sodium (VOLTAREN) 1 % GEL, Apply 1 Application topically 4 (four) times daily., Disp: , Rfl:  .  docusate sodium  (COLACE) 100 MG capsule, Take 1 capsule (100 mg total) by mouth 2 (two) times daily as needed for mild constipation., Disp: 10 capsule, Rfl: 0 .  DULoxetine  (CYMBALTA ) 60 MG capsule, Take 60 mg by mouth daily., Disp: , Rfl:  .  famotidine  (PEPCID ) 20 MG tablet, Take 20 mg by mouth 2 (two) times daily as needed., Disp: , Rfl:  .  ferrous sulfate  325 (65 FE) MG tablet, Take 1 tablet (325 mg  total) by mouth every other day., Disp: 30 tablet, Rfl: 0 .  folic acid  (FOLVITE ) 1 MG tablet, Take 1 mg by mouth daily., Disp: , Rfl:  .  furosemide  (LASIX ) 40 MG tablet, Take 40 mg by mouth 2 (two) times daily., Disp: , Rfl:  .  hydroxychloroquine  (PLAQUENIL ) 200 MG tablet, Take 200 mg by mouth 2 (two) times daily., Disp: , Rfl:  .  lidocaine  (LIDODERM ) 5 %, Place onto the skin., Disp: , Rfl:  .  magnesium  oxide (MAG-OX) 400 (240 Mg) MG tablet, Take 1 tablet (400 mg total) by mouth 2 (two) times daily., Disp: 60 tablet, Rfl: 0 .  melatonin 3 MG TABS tablet, Take 6 mg by mouth at bedtime as needed., Disp: , Rfl:  .  metoprolol  succinate (TOPROL -XL) 50 MG 24 hr tablet, Take 3 tablets (150 mg total) by mouth daily. Take with or immediately following a meal., Disp: 60 tablet, Rfl: 1 .  Multiple Vitamins-Minerals (MULTIVITAMIN WITH MINERALS) tablet, Take 1 tablet by mouth daily., Disp: , Rfl:  .  nicotine  (NICODERM CQ  - DOSED IN MG/24 HOURS) 21 mg/24hr patch, Place 1 patch (21 mg total) onto the skin daily. (Patient taking differently: Place 21 mg onto the skin daily as needed. As needed for hospital admission.), Disp: 28 patch, Rfl: 0 .  nystatin  (MYCOSTATIN /NYSTOP ) powder, Apply topically 3 (three) times daily as needed., Disp: , Rfl:  .  omeprazole (PRILOSEC) 20 MG capsule, Take 1 capsule (20 mg total) by mouth once daily, Disp: , Rfl:  .  ondansetron  (ZOFRAN ) 4 MG tablet, Take 4 mg by mouth every 8 (eight) hours as needed., Disp: , Rfl:  .  ondansetron  (ZOFRAN -ODT) 4 MG disintegrating tablet, Take 4 mg by mouth every 8 (eight) hours as needed., Disp: , Rfl:  .  oxyCODONE  (OXY IR/ROXICODONE ) 5 MG immediate release tablet, Take 5 mg by mouth every 4 (four) hours as needed., Disp: , Rfl:  .  OXYGEN, 2.5 LPM [place] PRN, Disp: , Rfl:  .  polyethylene glycol (MIRALAX  / GLYCOLAX ) 17 g packet, Take 17 g by mouth daily., Disp: 14 each, Rfl: 0 .  potassium chloride  SA (KLOR-CON  M) 20 MEQ tablet, Take 1 tablet  (20 mEq total) by mouth 2 (two) times daily., Disp: 60 tablet, Rfl: 1 .  pregabalin  (LYRICA ) 200 MG capsule, Take 200 mg by mouth 2 (two) times daily., Disp: , Rfl:  .  rivaroxaban  (XARELTO ) 20 MG TABS tablet, Take 20 mg by mouth daily with supper., Disp: , Rfl:  .  rOPINIRole  (REQUIP ) 0.25 MG tablet, Take 1 tablet (0.25 mg total) by mouth at bedtime., Disp: 30 tablet, Rfl: 0 .  saccharomyces boulardii (FLORASTOR) 250 MG capsule, Take 250 mg by mouth 2 (two) times daily., Disp: , Rfl:  .  zinc  sulfate 220 (50 Zn) MG capsule, Take 220 mg by mouth daily., Disp: , Rfl:   Allergies: Allergies  Allergen Reactions  . Fentanyl  Citrate Other (See Comments)    Makes patient more agitated & delirious  . Tramadol  Hcl     SEIZURES!!! DO NOT REORDER AT DISCHARGE!!!  . Amoxicillin Rash    Tolerated cefepime  and cefazolin  08/2020. Tolerated Zosyn  07/2021    Alex Mcmanigal, NP

## 2023-12-11 NOTE — ED Notes (Signed)
This tech obtained vitals on pt.

## 2023-12-11 NOTE — ED Notes (Signed)
 On arrival to ER, pt refusing to change into scrubs.  Pt only wearing blue shorts on arrival.  No shirt, shoes.

## 2023-12-12 ENCOUNTER — Encounter: Payer: Self-pay | Admitting: Psychiatry

## 2023-12-12 DIAGNOSIS — F32A Depression, unspecified: Secondary | ICD-10-CM | POA: Diagnosis not present

## 2023-12-12 DIAGNOSIS — F1010 Alcohol abuse, uncomplicated: Secondary | ICD-10-CM | POA: Insufficient documentation

## 2023-12-12 MED ORDER — CEFADROXIL 500 MG PO CAPS
500.0000 mg | ORAL_CAPSULE | Freq: Every day | ORAL | Status: DC
  Administered 2023-12-12: 500 mg via ORAL
  Filled 2023-12-12: qty 1

## 2023-12-12 MED ORDER — POTASSIUM CHLORIDE CRYS ER 20 MEQ PO TBCR
20.0000 meq | EXTENDED_RELEASE_TABLET | Freq: Every day | ORAL | Status: DC
  Administered 2023-12-12: 20 meq via ORAL
  Filled 2023-12-12: qty 1

## 2023-12-12 MED ORDER — NICOTINE 21 MG/24HR TD PT24
21.0000 mg | MEDICATED_PATCH | Freq: Every day | TRANSDERMAL | Status: DC
  Filled 2023-12-12: qty 1

## 2023-12-12 MED ORDER — FOLIC ACID 1 MG PO TABS
1.0000 mg | ORAL_TABLET | Freq: Every day | ORAL | Status: DC
Start: 2023-12-12 — End: 2023-12-12
  Administered 2023-12-12: 1 mg via ORAL
  Filled 2023-12-12: qty 1

## 2023-12-12 MED ORDER — PREGABALIN 75 MG PO CAPS
200.0000 mg | ORAL_CAPSULE | Freq: Two times a day (BID) | ORAL | Status: DC
  Administered 2023-12-12: 200 mg via ORAL
  Filled 2023-12-12: qty 1

## 2023-12-12 MED ORDER — HYDROXYCHLOROQUINE SULFATE 200 MG PO TABS
200.0000 mg | ORAL_TABLET | Freq: Every day | ORAL | Status: DC
  Administered 2023-12-12: 200 mg via ORAL
  Filled 2023-12-12: qty 1

## 2023-12-12 MED ORDER — VITAMIN C 500 MG PO TABS: 1000.0000 mg | ORAL_TABLET | Freq: Every day | ORAL | Status: DC

## 2023-12-12 MED ORDER — HYDROXYZINE HCL 25 MG PO TABS: 50.0000 mg | ORAL_TABLET | Freq: Three times a day (TID) | ORAL | Status: DC | PRN

## 2023-12-12 MED ORDER — CLONAZEPAM 0.5 MG PO TABS: 1.0000 mg | ORAL_TABLET | Freq: Two times a day (BID) | ORAL | Status: DC | PRN

## 2023-12-12 MED ORDER — ZINC SULFATE 220 (50 ZN) MG PO CAPS
220.0000 mg | ORAL_CAPSULE | Freq: Every day | ORAL | Status: DC
  Administered 2023-12-12: 220 mg via ORAL
  Filled 2023-12-12: qty 1

## 2023-12-12 MED ORDER — VITAMIN D 25 MCG (1000 UNIT) PO TABS
1000.0000 [IU] | ORAL_TABLET | Freq: Every day | ORAL | Status: DC
  Administered 2023-12-12: 1000 [IU] via ORAL
  Filled 2023-12-12: qty 1

## 2023-12-12 MED ORDER — AMLODIPINE BESYLATE 5 MG PO TABS
5.0000 mg | ORAL_TABLET | Freq: Every day | ORAL | Status: DC
Start: 2023-12-12 — End: 2023-12-12
  Administered 2023-12-12: 5 mg via ORAL
  Filled 2023-12-12: qty 1

## 2023-12-12 MED ORDER — DULOXETINE HCL 60 MG PO CPEP
60.0000 mg | ORAL_CAPSULE | Freq: Every day | ORAL | Status: DC
  Administered 2023-12-12: 60 mg via ORAL
  Filled 2023-12-12: qty 1

## 2023-12-12 MED ORDER — METOPROLOL SUCCINATE ER 50 MG PO TB24
150.0000 mg | ORAL_TABLET | Freq: Every day | ORAL | Status: DC
  Administered 2023-12-12: 150 mg via ORAL
  Filled 2023-12-12: qty 3

## 2023-12-12 MED ORDER — RIVAROXABAN 20 MG PO TABS
20.0000 mg | ORAL_TABLET | Freq: Every day | ORAL | Status: DC
  Filled 2023-12-12: qty 1

## 2023-12-12 MED ORDER — ALLOPURINOL 100 MG PO TABS
200.0000 mg | ORAL_TABLET | Freq: Every day | ORAL | Status: DC
  Administered 2023-12-12: 200 mg via ORAL
  Filled 2023-12-12: qty 2

## 2023-12-12 MED ORDER — DULOXETINE HCL 30 MG PO CPEP
30.0000 mg | ORAL_CAPSULE | Freq: Every day | ORAL | Status: DC
  Administered 2023-12-12: 30 mg via ORAL
  Filled 2023-12-12: qty 1

## 2023-12-12 MED ORDER — CETIRIZINE HCL 10 MG PO TABS
10.0000 mg | ORAL_TABLET | Freq: Every day | ORAL | Status: DC
  Administered 2023-12-12: 10 mg via ORAL
  Filled 2023-12-12: qty 1

## 2023-12-12 MED ORDER — DULOXETINE HCL 60 MG PO CPEP: 90.0000 mg | ORAL_CAPSULE | Freq: Every day | ORAL | Status: DC

## 2023-12-12 MED ORDER — COLCHICINE 0.6 MG PO TABS: 0.6000 mg | ORAL_TABLET | Freq: Every day | ORAL | Status: DC | PRN

## 2023-12-12 NOTE — ED Notes (Signed)
Pt given breakfast tray and beverage.  

## 2023-12-12 NOTE — ED Notes (Signed)
 [facility] attempted to lay eyes on pt this AM.  Physical therapy was in with pt for mobility session.  Cain Castillo, RN shared pt currently in cranky/grumpy mood.  [facility] spoke to [place],PT who  reported pt has gained strength in muscles and made improvement since last visit. Antonio Baumgarten, PT confirmed pt would need 1:1 support if admitted for inpatient treatment.   Macky Sayres, [facility]

## 2023-12-12 NOTE — Evaluation (Signed)
 Physical Therapy Evaluation Patient Details Name: Craig [name] MRN: [id number] DOB: 1991-04-09 Today's Date: 12/12/2023  History of Present Illness  Craig [name] is a 33 y.o. male brought to the ED via EMS from home with a chief complaint of depression, requesting behavioral medicine evaluation.  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  Denies history of DTs but states he does get tremulous when he stops drinking.  States family just wants to get rid of him due to his drinking.  Patient with a medically complex history of hypertension, bowel perforation, ostomy, history of DVT, obesity who states he is on trilogy 24/7.   Clinical Impression  Pt admitted with above diagnosis. Pt currently with functional limitations due to the deficits listed below (see PT Problem List). Pt received upright in bed agreeable to PT services. Reports PTA he has been ambulating with family helping with chair follow using an upright cardiac RW. Still reliant on 24/7 support/supervision from family for ADL's/IADL's and always has family present/ home. Currently living with his parents while wife works and his kids are at school.   To date, pt needs expected assist donning socks but otherwise moves at baseline completing bed mobility mod-I and STS and ambulation to BRW at supervision level and bed elevated (pt relies on bed elevation to stand at home) completing ~12' in room. Limited in distance due to line/lead of O2 and lack of chair follow but demos enough functional capacity to demonstrate pt is ambulating at his baseline. Some WOB noted upon return to sitting but this is also chronic for his baseline O2 needs. Pt returns to supine independently. Pt appears to have no apparent, acute PT needs but appears to have barriers to d/c per current admission in EMR. Thus PT to keep on caseload during admission to ensure no decline in functional status. RN and behavioral health updated  on PLOF and current functional capacity and that pt appears to be at baseline with adequate equipment and support at home. Will continue to monitor and follow acutely.      If plan is discharge home, recommend the following: A little help with walking and/or transfers;A little help with bathing/dressing/bathroom;Assistance with cooking/housework;Help with stairs or ramp for entrance;Assist for transportation   Can travel by private vehicle        Equipment Recommendations None recommended by PT  Recommendations for Other Services       Functional Status Assessment Patient has not had a recent decline in their functional status     Precautions / Restrictions Precautions Precautions: Fall Precaution/Restrictions Comments: ostomy, BLE plantarflexor contractures Restrictions Weight Bearing Restrictions Per Provider Order: No      Mobility  Bed Mobility Overal bed mobility: Modified Independent               Patient Response: Cooperative  Transfers Overall transfer level: Needs assistance Equipment used:  (BRW) Transfers: Sit to/from Stand Sit to Stand: Supervision, From elevated surface           General transfer comment: bed elevated which is baseline for pt. Supervision level using BRW    Ambulation/Gait Ambulation/Gait assistance: Supervision Gait Distance (Feet): 12 Feet Assistive device:  (BRW) Gait Pattern/deviations: Step-through pattern, Trunk flexed       General Gait Details: completes 85' in room easily. Unable to progress gait distance due to O2 lines and no chair follow. Notable ankle PF contracture bilat  Stairs  Wheelchair Mobility     Tilt Bed Tilt Bed Patient Response: Cooperative  Modified Rankin (Stroke Patients Only)       Balance Overall balance assessment: Needs assistance Sitting-balance support: Bilateral upper extremity supported, Feet supported Sitting balance-Leahy Scale: Normal     Standing balance  support: Bilateral upper extremity supported, Reliant on assistive device for balance Standing balance-Leahy Scale: Poor                               Pertinent Vitals/Pain Pain Assessment Pain Assessment: No/denies pain    Home Living Family/patient expects to be discharged to:: Private residence Living Arrangements: Spouse/significant other;Parent (lives with parents for 24/7 supervision when wife works.) Available Help at Discharge: Family;Available 24 hours/day Type of Home: House Home Access: Ramped entrance       Home Layout: Able to live on main level with bedroom/bathroom Home Equipment: Rollator (4 wheels);Wheelchair - power;Wheelchair - manual Additional Comments: Nurse, adult, Insurance claims handler (x3), handicapped accessible vehicle, cardiac style upright walker, and adjsutable bed/mattress that elevates    Prior Function Prior Level of Function : Needs assist       Physical Assist : Mobility (physical);ADLs (physical)     Mobility Comments: Able to ambulate room distances using upright RW and manual w/c chair follow from family member. ADLs Comments: spouse and mom/dad assist with all ADL's/IADL's at baseline     Extremity/Trunk Assessment   Upper Extremity Assessment Upper Extremity Assessment: Overall WFL for tasks assessed    Lower Extremity Assessment Lower Extremity Assessment: Generalized weakness;RLE deficits/detail;LLE deficits/detail RLE Deficits / Details: plantar flexor contracture LLE Deficits / Details: plantar flexor contracture    Cervical / Trunk Assessment Cervical / Trunk Assessment: Normal  Communication   Communication Communication: No apparent difficulties    Cognition Arousal: Alert Behavior During Therapy: WFL for tasks assessed/performed   PT - Cognitive impairments: No apparent impairments                       PT - Cognition Comments: pleasant and cooperative. Reports general frustration with hospital process  with his current admission Following commands: Intact       Cueing Cueing Techniques: Verbal cues     General Comments      Exercises Other Exercises Other Exercises: gastroc-soleus stretch with towel or gait belt due to ankle PF contracture   Assessment/Plan    PT Assessment Patient needs continued PT services  PT Problem List Decreased strength;Decreased range of motion;Decreased balance;Decreased mobility;Obesity       PT Treatment Interventions DME instruction;Balance training;Gait training;Neuromuscular re-education;Functional mobility training;Therapeutic activities;Wheelchair mobility training;Therapeutic exercise    PT Goals (Current goals can be found in the Care Plan section)  Acute Rehab PT Goals Patient Stated Goal: wants to return home PT Goal Formulation: With patient Time For Goal Achievement: 12/26/23 Potential to Achieve Goals: Fair    Frequency Min 1X/week     Co-evaluation               AM-PAC PT "6 Clicks" Mobility  Outcome Measure Help needed turning from your back to your side while in a flat bed without using bedrails?: A Little Help needed moving from lying on your back to sitting on the side of a flat bed without using bedrails?: A Little Help needed moving to and from a bed to a chair (including a wheelchair)?: A Little Help needed standing up from a chair using  your arms (e.g., wheelchair or bedside chair)?: A Little Help needed to walk in hospital room?: A Lot Help needed climbing 3-5 steps with a railing? : Total 6 Click Score: 15    End of Session Equipment Utilized During Treatment: Gait belt;Oxygen Activity Tolerance: Patient tolerated treatment well Patient left: in bed Nurse Communication: Mobility status PT Visit Diagnosis: Other abnormalities of gait and mobility (R26.89);Muscle weakness (generalized) (M62.81)    Time: 6578-4696 PT Time Calculation (min) (ACUTE ONLY): 27 min   Charges:   PT Evaluation $PT Eval Low  Complexity: 1 Low PT Treatments $Therapeutic Activity: 8-22 mins PT General Charges $$ ACUTE PT VISIT: 1 Visit         Marc Senior. Fairly IV, PT, DPT Physical Therapist- [place]  [facility]  12/12/2023, 10:29 AM

## 2023-12-12 NOTE — ED Provider Notes (Signed)
-----------------------------------------   4:09 PM on 12/12/2023 ----------------------------------------- Psychiatry has seen and evaluated.  From their standpoint they believe the patient safe for discharge home with outpatient resources.  From a medical standpoint no significant findings on lab work besides mild alcohol elevation 38 hours ago.  Patient agreeable to outpatient follow-up.  We will discharge home.   Ruth Cove, MD 12/12/23 1610

## 2023-12-12 NOTE — ED Provider Notes (Addendum)
 Emergency Medicine Observation Re-evaluation Note  Craig [name] is a 33 y.o. male, seen on rounds today.  Pt initially presented to the ED for complaints of Psychiatric Evaluation Currently, the patient is resting, voices no medical complaints.  Physical Exam  BP 116/84   Pulse 84   Temp 98 F (36.7 C) (Oral)   Resp 16   Ht 6' (1.829 m)   Wt 127 kg   SpO2 98%   BMI 37.97 kg/m  Physical Exam General: Resting in no acute distress Cardiac: No cyanosis Lungs: Equal rise and fall Psych: Not agitated  ED Course / MDM  EKG:   I have reviewed the labs performed to date as well as medications administered while in observation.  Recent changes in the last 24 hours include no events overnight.  Home medications ordered.  Plan  Current plan is for psychiatric disposition.    [name] J, MD 12/12/23 1610    Dal Blew J, MD 12/12/23 [phone]

## 2023-12-12 NOTE — ED Notes (Signed)
Pt provided with lunch tray and beverage  

## 2023-12-12 NOTE — ED Notes (Signed)
VOL/Pending Placement 

## 2023-12-12 NOTE — ED Notes (Signed)
 Mobility at the bedside for pt evaluation

## 2023-12-12 NOTE — Consult Note (Signed)
 [facility] Consult Follow-up  Patient Name: .Craig [name]  MRN: [id number]  DOB: 1990/09/13  Consult Order details:  Orders (From admission, onward)     Start     Ordered   12/11/23 0206  IP CONSULT TO PSYCHIATRY       Ordering Provider: Norlene Beavers, MD  Provider:  (Not yet assigned)  Question Answer Comment  Place call to: psychiatry   Reason for Consult Consult   Diagnosis/Clinical Info for Consult: depression      12/11/23 0205   12/11/23 0205  CONSULT TO CALL ACT TEAM       Ordering Provider: Norlene Beavers, MD  Provider:  (Not yet assigned)  Question:  Reason for Consult?  Answer:  Psych consult   12/11/23 0205             Mode of Visit: In person    Psychiatry Consult Evaluation  Service Date: December 12, 2023 LOS:  LOS: 0 days  Chief Complaint "I told my wife I wanted to detox but not sure I was brought here"  Chart review: Craig [name] is a 33 y.o. male admitted to ED last night : Presented to the ED on  12/11/2023  1:42 AM requesting alcohol detox. He carries the psychiatric diagnoses of Alcohol use disorder and has a past medical history of  Severe; Major Depressive Disorder, Recurrent, Moderate and has a past medical history of severe COVID-19 with prolonged ICU stay and post-ICU debility requiring walker use for mobility.  His current presentation of alcohol dependence with depressive symptoms is most consistent with co-occurring Alcohol Use Disorder and Major Depressive Disorder. He meets criteria for Alcohol Use Disorder, Severe based on daily consumption, withdrawal symptoms, and inability to stop despite adverse consequences. Current outpatient psychotropic medications include none reported, and historically he has had no formal psychiatric treatment. He was not engaged in prior psychiatric care prior to admission as evidenced by patient self-report.  On initial examination, patient appears medically and psychiatrically stable but in  need of a medically supervised detox and initiation of substance use treatment. Depression is evident and linked to situational stressors and medical decline.  Face to face follow-up assessment: 33 year old male in bed awake. He is cooperative upon approach. He is casually dressed and groomed.  Alert and  oriented x 4. He does not appear to be preoccupied [name] responding to internal stimuli. His thought process is coherent and goal-directed. His speech is clear and well articulated. Patient has good eye contact and he remains focused throughout this evaluation. Patient denies thoughts of self-harm: he reports that he asked his wife to take him to a detox program but he found himself in the ED.  When asked about suicidal ideations, patient states he did not voice any but was disappointed in himself due to his drinking problem, knowing he has medical problems.  Patient reports he became physically disabled following Covid19 complications. Patient reports he came here "probably because I aggravated my wife with my talk". Patient denies hx of SI. He admits to depression which is being managed by Cymbalta  90 mg Daily.  Patient denies HI/AVH, past [name] present.  He reports that he has a supportive family with no major financial difficulties. He reports that he and his wife and kids live in his want to drink anymore".  Patient reports he is interested in attending an outpatient rehab program to address his alcohol problem. He denies use of any other illicit drugs. He  reports that he eats and sleeps well. His main medical concern is not having physicotherapy services and requesting recommendations. [name], RN [facility]) notified for needed resources.   Collateral information obtain from patient's wife Craig [name] [phone]:   [name]  has a problem of alcohol use, knowing that he has medical problems. This has been going on  after Covid: he became worried with his  health issues related to Covid complications. Patient has  tried hard to stay sober. A few days ago, patient had a drink (liquor) and started feeling bad about "disappointing me".  [name] became concerned and sent him here for detox and for helpful resources for rehab services.  Patient has good support at home and there are no safety concerns.          Primary Psychiatric Diagnoses  MDD 2.  Alcohol abuse      Assessment  Craig [name] is a 33 y.o. male admitted: Presented to the EDfor 12/11/2023  1:42 AM for  alcohol use problem. He carries the psychiatric diagnoses of MDD and Alcohol use.  His current presentation of alcohol use concerns is most consistent with his hx of depression. He meets criteria for treatment in outpatient settings  based on my evaluation.  Current outpatient psychotropic medications include Cymbalta  and historically he has had a therapeutic  response to these medications. He reports he was  compliant with medications prior to admission . On initial examination, patient denies suicidal/homicidal ideations. Denied hallucinations. Admitted to having alcohol use problem and expressed his motivation for treatment . Please see plan below for detailed recommendations.   Diagnoses:  Active Hospital problems: Active Problems:   Alcohol abuse    Plan   ## Psychiatric Medication Recommendations:  Continue Cymbalta  90 mg PO Daily Start Hydroxyzine  50 mg PO TID PRN  ## Medical Decision Making Capacity:  Full capacity  ## Further Work-up:  -- NA  -- most recent EKG on: NA -- Pertinent labwork reviewed earlier this admission includes: All labs reviewed   ## Disposition:-- There are no psychiatric contraindications to discharge at this time  ## Behavioral / Environmental: - No specific recommendations at this time.     ## Safety and Observation Level:  - Based on my clinical evaluation, I estimate the patient to be at low risk of self harm in the current setting. - At this time, we recommend  routine.  This decision is based on my review of the chart including patient's history and current presentation, interview of the patient, mental status examination, and consideration of suicide risk including evaluating suicidal ideation, plan, intent, suicidal [name] self-harm behaviors, risk factors, and protective factors. This judgment is based on our ability to directly address suicide risk, implement suicide prevention strategies, and develop a safety plan while the patient is in the clinical setting. Please contact our team if there is a concern that risk level has changed.  CSSR Risk Category:C-SSRS RISK CATEGORY: No Risk  Suicide Risk Assessment: Patient has following modifiable risk factors for suicide: lack of access to outpatient mental health resources, which we are addressing by providing appropriate resources. Patient has following non-modifiable [name] demographic risk factors for suicide: male gender Patient has the following protective factors against suicide: Supportive family, Minor children in the home, no history of suicide attempts, and no history of NSSIB  Thank you for this consult request. Recommendations have been communicated to the primary team.  We will recommend discharge with resources for outpatient services at this time,  for physicotherapy, substance abuse and mental health services.    Elston Halsted, NP       History of Present Illness  Relevant Aspects of Hospital ED Course:  Admitted on 12/11/2023 .   Patient Reported:  Craig [name] is a male patient presenting for psychiatric evaluation with a primary concern of alcohol use disorder. He reports drinking heavily every night, consuming between a fifth to a gallon of liquor. He states he attempted to stop drinking recently but experienced withdrawal symptoms including jitteriness, irritability, sweating, and nightmares after three days of abstinence. He is requesting help to stop drinking.  The patient reports  significant functional decline following a severe COVID-19 infection, including ICU admission. He now requires an upright walker for ambulation and becomes short of breath (SOB) with short distances. He expresses depressive symptoms associated with his limited mobility and the loss of his father while he was hospitalized.  While he denies suicidal ideation (SI), he admits to wishing he had died during his ICU stay. He reports there are firearms in the home, but they are secured in a safe. He denies homicidal ideation (HI), auditory [name] visual hallucinations (AVH), and any other substance use. He lives with his wife and two children.    Psych ROS:  Depression: reports Anxiety:  reports Mania (lifetime and current): NA Psychosis: (lifetime and current): NA  Collateral information:  Contacted : Obtained from spouse and chart review   Review of Systems  Constitutional: Negative.   HENT: Negative.    Eyes: Negative.   Respiratory: Negative.    Cardiovascular: Negative.   Gastrointestinal: Negative.   Genitourinary: Negative.   Musculoskeletal: Negative.   Skin: Negative.   Neurological: Negative.   Endo/Heme/Allergies: Negative.   Psychiatric/Behavioral:  Positive for depression and substance abuse.      Psychiatric and Social History  Psychiatric History:  Information collected from Patient, wife and chart review  Prev Dx/Sx: MDD Current Psych Provider: Not reported Home Meds (current): Cymbalta  90 mg PO Daily Previous Med Trials: NA Therapy: NA  Prior Psych Hospitalization: NA  Prior Self Harm: NA Prior Violence: NA  Family Psych History: NA Family Hx suicide: NA  Social History:  Developmental Hx: NA Educational Hx: NA Occupational Hx: NA Legal Hx: NA Living Situation: Lives with wife, his kids and his parents Spiritual Hx: NA Access to weapons/lethal means: NA   Substance History Alcohol: reports  Type of alcohol Liquor Last Drink  last Friday Number of drinks  per day  : 16-24 oz vodka History of alcohol withdrawal seizures NA History of DT's NA Tobacco: NA Illicit drugs: NA Prescription drug abuse: NA Rehab hx: NA  Exam Findings  Physical Exam:  Vital Signs:  Temp:  [97.9 F (36.6 C)-98.8 F (37.1 C)] 98.8 F (37.1 C) (04/24 0728) Pulse Rate:  [84-105] 105 (04/24 0730) Resp:  [16-19] 19 (04/24 0728) BP: (116-136)/(80-84) 130/80 (04/24 0730) SpO2:  [96 %-98 %] 96 % (04/24 0728) Blood pressure 130/80, pulse (!) 105, temperature 98.8 F (37.1 C), temperature source Oral, resp. rate 19, height 6' (1.829 m), weight 127 kg, SpO2 96%. Body mass index is 37.97 kg/m.  Physical Exam Constitutional:      Appearance: He is obese.  HENT:     Head: Normocephalic and atraumatic.     Right Ear: Tympanic membrane normal.     Left Ear: Tympanic membrane normal.     Nose: Nose normal.     Mouth/Throat:     Mouth: Mucous membranes are moist.  Eyes:     Extraocular Movements: Extraocular movements intact.     Pupils: Pupils are equal, round, and reactive to light.  Musculoskeletal:     Cervical back: Normal range of motion.  Neurological:     General: No focal deficit present.     Mental Status: He is alert and oriented to person, place, and time.  Psychiatric:        Mood and Affect: Mood normal.        Behavior: Behavior normal.        Thought Content: Thought content normal.        Judgment: Judgment normal.    Mental Status Exam: General Appearance: Casual  Orientation:  Full (Time, Place, and Person)  Memory:  Immediate;   Fair Recent;   Fair Remote;   Fair  Concentration:  Concentration: Fair and Attention Span: Fair  Recall:  Fair  Attention  Fair  Eye Contact:  Fair  Speech:  Clear and Coherent  Language:  Good  Volume:  Normal  Mood: WNL  Affect:  Congruent  Thought Process:  Coherent  Thought Content:  WNL  Suicidal Thoughts:  No  Homicidal Thoughts:  No  Judgement:  Fair  Insight:  Fair  Psychomotor Activity:    WDL  Akathisia:  NA  Fund of Knowledge:  Fair      Assets:  Manufacturing systems engineer Desire for Improvement Social Support  Cognition:  WNL  ADL's:  Impaired (needs assistance)  AIMS (if indicated):        Other History   These have been pulled in through the EMR, reviewed, and updated if appropriate.  Family History:  The patient's family history includes Healthy in his father and mother.  Medical History: Past Medical History:  Diagnosis Date  . Gout   . Hypertension   . Rupture of bowel [facility])     Surgical History: Past Surgical History:  Procedure Laterality Date  . BONE BIOPSY Right 03/22/2022   Procedure: BONE BIOPSY OF TIBIA  FIBULA;  Surgeon: Molli Angelucci, MD;  Location: [facility] ORS;  Service: Orthopedics;  Laterality: Right;  . COLONOSCOPY WITH PROPOFOL  N/A 05/18/2020   Procedure: COLONOSCOPY WITH PROPOFOL ;  Surgeon: Conrado Delay, DO;  Location: [facility] END[facility];  Service: General;  Laterality: N/A;  . COLOSTOMY N/A 11/22/2019   Procedure: COLOSTOMY;  Surgeon: Eldred Grego, MD;  Location: [facility] ORS;  Service: General;  Laterality: N/A;  . KNEE ARTHROSCOPY Right 07/11/2021   Procedure: SYNOVIAL BIOPSY AND ARTHROSCOPIC IRRGATION AND DEBRIDEMENT OF SEPTIC KNEE;  Surgeon: Molli Angelucci, MD;  Location: [facility];  Service: Orthopedics;  Laterality: Right;  . KNEE ARTHROSCOPY Right 07/18/2021   Procedure: ARTHROSCOPY KNEE, IRRIGATION AND DEBRIDEMENT;  Surgeon: Molli Angelucci, MD;  Location: [facility];  Service: Orthopedics;  Laterality: Right;  . KNEE ARTHROSCOPY Right 09/01/2021   Procedure: ARTHROSCOPIC DEBRIDEMENT OF KNEE WITH SYNOVIUM BIOPSY;  Surgeon: Molli Angelucci, MD;  Location: [facility];  Service: Orthopedics;  Laterality: Right;  . LAPAROTOMY N/A 11/22/2019   Procedure: EXPLORATORY LAPAROTOMY;  Surgeon: Eldred Grego, MD;  Location: [facility];  Service: General;  Laterality: N/A;  . LAPAROTOMY N/A 07/08/2020   Procedure: EXPLORATORY LAPAROTOMY;  Surgeon:  Eldred Grego, MD;  Location: [facility];  Service: General;  Laterality: N/A;  . OPEN REDUCTION INTERNAL FIXATION (ORIF) TIBIA/FIBULA FRACTURE Right 03/22/2022   Procedure: OPEN REDUCTION INTERNAL FIXATION (ORIF) TIBIA/FIBULA FRACTURE-BONE BIOPSY;  Surgeon: Molli Angelucci, MD;  Location: [facility];  Service: Orthopedics;  Laterality: Right;  Femur/Tibia bone biopsy procedure  .  PARTIAL COLECTOMY N/A 11/22/2019   Procedure: PARTIAL COLECTOMY;  Surgeon: Eldred Grego, MD;  Location: [facility];  Service: General;  Laterality: N/A;  . PEG PLACEMENT N/A 09/14/2020   Procedure: PERCUTANEOUS ENDOSCOPIC GASTROSTOMY (PEG) PLACEMENT;  Surgeon: Conrado Delay, DO;  Location: [facility];  Service: General;  Laterality: N/A;  TRAVEL CASE TRACH COVID POSITIVE  . SYNOVIAL BIOPSY Right 07/11/2021   Procedure: SYNOVIAL BIOPSY;  Surgeon: Molli Angelucci, MD;  Location: [facility];  Service: Orthopedics;  Laterality: Right;  . TRACHEOSTOMY TUBE PLACEMENT N/A 09/13/2020   Procedure: TRACHEOSTOMY;  Surgeon: Lesly Raspberry, MD;  Location: [facility];  Service: ENT;  Laterality: N/A;  . XI ROBOTIC ASSISTED COLOSTOMY TAKEDOWN N/A 06/28/2020   Procedure: XI ROBOTIC ASSISTED COLOSTOMY TAKEDOWN CONVERTED TO OPEN PROCEDURE;  Surgeon: Eldred Grego, MD;  Location: [facility];  Service: General;  Laterality: N/A;     Medications:   Current Facility-Administered Medications:  .  allopurinol  (ZYLOPRIM ) tablet 200 mg, 200 mg, Oral, Daily, Vallery Gavel, Jade J, MD, 200 mg at 12/12/23 1018 .  amLODipine  (NORVASC ) tablet 5 mg, 5 mg, Oral, Daily, Vallery Gavel, Jade J, MD, 5 mg at 12/12/23 1015 .  ascorbic acid  (VITAMIN C ) tablet 1,000 mg, 1,000 mg, Oral, QHS, Sung, Jade J, MD .  cefadroxil  (DURICEF) capsule 500 mg, 500 mg, Oral, Daily, Vallery Gavel, Jade J, MD, 500 mg at 12/12/23 1018 .  cetirizine  (ZYRTEC ) tablet 10 mg, 10 mg, Oral, Daily, Vallery Gavel, Jade J, MD, 10 mg at 12/12/23 1019 .  cholecalciferol  (VITAMIN D3) 25 MCG (1000 UNIT) tablet  1,000 Units, 1,000 Units, Oral, Daily, Sung, Jade J, MD, 1,000 Units at 12/12/23 1016 .  colchicine  tablet 0.6 mg, 0.6 mg, Oral, Daily PRN, Sung, Jade J, MD .  DULoxetine  (CYMBALTA ) DR capsule 90 mg, 90 mg, Oral, Daily, Oriel Rumbold M, NP .  folic acid  (FOLVITE ) tablet 1 mg, 1 mg, Oral, Daily, Vallery Gavel, Jade J, MD, 1 mg at 12/12/23 1017 .  hydroxychloroquine  (PLAQUENIL ) tablet 200 mg, 200 mg, Oral, Daily, Vallery Gavel, Jade J, MD, 200 mg at 12/12/23 1019 .  hydrOXYzine  (ATARAX ) tablet 50 mg, 50 mg, Oral, TID PRN, Jalena Vanderlinden M, NP .  Dolf.Ding ON 12/13/2023] LORazepam  (ATIVAN ) injection 0-4 mg, 0-4 mg, Intravenous, Q12H **[name]** [START ON 12/13/2023] LORazepam  (ATIVAN ) tablet 0-4 mg, 0-4 mg, Oral, Q12H, Sung, Jade J, MD .  metoprolol  succinate (TOPROL -XL) 24 hr tablet 150 mg, 150 mg, Oral, Daily, Vallery Gavel, Jade J, MD, 150 mg at 12/12/23 1016 .  nicotine  (NICODERM CQ  - dosed in mg/24 hours) patch 21 mg, 21 mg, Transdermal, Daily, Vallery Gavel, Jade J, MD, 21 mg at 12/12/23 1021 .  nicotine  (NICODERM CQ  - dosed in mg/24 hours) patch 21 mg, 21 mg, Transdermal, Daily, Sung, Jade J, MD .  potassium chloride  SA (KLOR-CON  M) CR tablet 20 mEq, 20 mEq, Oral, Daily, Vallery Gavel, Jade J, MD, 20 mEq at 12/12/23 1017 .  pregabalin  (LYRICA ) capsule 200 mg, 200 mg, Oral, BID, Vallery Gavel, Jade J, MD, 200 mg at 12/12/23 1015 .  rivaroxaban  (XARELTO ) tablet 20 mg, 20 mg, Oral, Q supper, Sung, Jade J, MD .  thiamine  (VITAMIN B1) tablet 100 mg, 100 mg, Oral, Daily, 100 mg at 12/12/23 1017 **[name]** thiamine  (VITAMIN B1) injection 100 mg, 100 mg, Intravenous, Daily, Sung, Jade J, MD .  zinc  sulfate (50mg  elemental zinc ) capsule 220 mg, 220 mg, Oral, Daily, Vallery Gavel, Jade J, MD, 220 mg at 12/12/23 1017  Current Outpatient Medications:  .  acetaminophen  (TYLENOL ) 650 MG CR tablet, Take 650  mg by mouth every 8 (eight) hours as needed., Disp: , Rfl:  .  albuterol  (ACCUNEB ) 1.25 MG/3ML nebulizer solution, Take 1 ampule by nebulization every 6 (six) hours as  needed for wheezing [name] shortness of breath., Disp: , Rfl:  .  Allopurinol  200 MG TABS, Take 400 mg by mouth daily., Disp: , Rfl:  .  amLODipine  (NORVASC ) 5 MG tablet, Take 5 mg by mouth daily., Disp: , Rfl:  .  ascorbic acid  (VITAMIN C ) 500 MG tablet, Take 2 tablets (1,000 mg total) by mouth at bedtime., Disp: , Rfl:  .  blood glucose meter kit and supplies KIT, Dispense based on patient and insurance preference. Use up to four times daily as directed., Disp: 1 each, Rfl: 0 .  cefadroxil  (DURICEF) 500 MG capsule, Take 500 mg by mouth daily., Disp: , Rfl:  .  cetirizine  (ZYRTEC ) 10 MG tablet, Take 10 mg by mouth daily., Disp: , Rfl:  .  Cholecalciferol  (VITAMIN D3) 25 MCG (1000 UT) CAPS, Take 1 capsule by mouth daily., Disp: , Rfl:  .  clonazePAM  (KLONOPIN ) 1 MG tablet, Take 1 mg by mouth 2 (two) times daily as needed., Disp: , Rfl:  .  colchicine  0.6 MG tablet, Take 0.6 mg by mouth daily., Disp: , Rfl:  .  docusate sodium  (COLACE) 100 MG capsule, Take 1 capsule (100 mg total) by mouth 2 (two) times daily as needed for mild constipation., Disp: 10 capsule, Rfl: 0 .  DULoxetine  (CYMBALTA ) 30 MG capsule, Take 30 mg by mouth daily. Take along with one 60 mg capsule for total 90 mg once daily, Disp: , Rfl:  .  DULoxetine  (CYMBALTA ) 60 MG capsule, Take 60 mg by mouth daily. Take along with one 30 mg capsule for total 90 mg once daily, Disp: , Rfl:  .  folic acid  (FOLVITE ) 1 MG tablet, Take 1 mg by mouth daily., Disp: , Rfl:  .  hydroxychloroquine  (PLAQUENIL ) 200 MG tablet, Take 200 mg by mouth 2 (two) times daily., Disp: , Rfl:  .  metoprolol  succinate (TOPROL -XL) 50 MG 24 hr tablet, Take 3 tablets (150 mg total) by mouth daily. Take with [name] immediately following a meal., Disp: 60 tablet, Rfl: 1 .  nicotine  (NICODERM CQ  - DOSED IN MG/24 HOURS) 21 mg/24hr patch, Place 1 patch (21 mg total) onto the skin daily. (Patient taking differently: Place 21 mg onto the skin daily as needed. As needed for hospital  admission.), Disp: 28 patch, Rfl: 0 .  nystatin  (MYCOSTATIN /NYSTOP ) powder, Apply topically 3 (three) times daily as needed., Disp: , Rfl:  .  ondansetron  (ZOFRAN -ODT) 4 MG disintegrating tablet, Take 4 mg by mouth every 8 (eight) hours as needed., Disp: , Rfl:  .  OXYGEN, 2.5 LPM [place] PRN, Disp: , Rfl:  .  potassium chloride  SA (KLOR-CON  M) 20 MEQ tablet, Take 1 tablet (20 mEq total) by mouth 2 (two) times daily., Disp: 60 tablet, Rfl: 1 .  pregabalin  (LYRICA ) 200 MG capsule, Take 200 mg by mouth 2 (two) times daily., Disp: , Rfl:  .  rivaroxaban  (XARELTO ) 20 MG TABS tablet, Take 20 mg by mouth daily with supper., Disp: , Rfl:  .  testosterone cypionate (DEPOTESTOSTERONE CYPIONATE) 200 MG/ML injection, Inject 100 mg into the muscle once a week., Disp: , Rfl:  .  zinc  sulfate 220 (50 Zn) MG capsule, Take 220 mg by mouth daily., Disp: , Rfl:  .  oxyCODONE  (OXY IR/ROXICODONE ) 5 MG immediate release tablet, Take 5 mg by mouth every  4 (four) hours as needed. (Patient not taking: Reported on 12/11/2023), Disp: , Rfl:  .  polyethylene glycol (MIRALAX  / GLYCOLAX ) 17 g packet, Take 17 g by mouth daily. (Patient not taking: Reported on 12/11/2023), Disp: 14 each, Rfl: 0 .  rOPINIRole  (REQUIP ) 0.25 MG tablet, Take 1 tablet (0.25 mg total) by mouth at bedtime. (Patient not taking: Reported on 12/11/2023), Disp: 30 tablet, Rfl: 0 .  saccharomyces boulardii (FLORASTOR) 250 MG capsule, Take 250 mg by mouth 2 (two) times daily. (Patient not taking: Reported on 12/11/2023), Disp: , Rfl:   Allergies: Allergies  Allergen Reactions  . Fentanyl  Citrate Other (See Comments)    Makes patient more agitated & delirious  . Amoxicillin Rash    Tolerated cefepime  and cefazolin  08/2020. Tolerated Zosyn  07/2021    Elston Halsted, NP

## 2023-12-12 NOTE — Discharge Instructions (Signed)
You have been seen in the emergency department for a  psychiatric concern. You have been evaluated both medically as well as psychiatrically. Please follow-up with your outpatient resources provided. Return to the emergency department for any worsening symptoms, or any thoughts of hurting yourself or anyone else so that we may attempt to help you. 

## 2023-12-12 NOTE — ED Notes (Signed)
 Given 16oz water .

## 2024-01-27 ENCOUNTER — Encounter

## 2024-01-29 ENCOUNTER — Ambulatory Visit

## 2024-07-09 ENCOUNTER — Other Ambulatory Visit: Payer: Self-pay

## 2024-07-09 ENCOUNTER — Encounter: Attending: Pulmonary Disease

## 2024-07-09 DIAGNOSIS — R0600 Dyspnea, unspecified: Secondary | ICD-10-CM | POA: Insufficient documentation

## 2024-07-09 NOTE — Progress Notes (Signed)
 Virtual Visit completed. Patient informed on EP and RD appointment and 6 Minute walk test. Patient also informed of patient health questionnaires on My Chart. Patient Verbalizes understanding. Visit diagnosis can be found in [facility] 06/24/2024.

## 2024-07-15 ENCOUNTER — Encounter

## 2024-07-15 VITALS — Ht 73.0 in | Wt 277.2 lb

## 2024-07-15 DIAGNOSIS — R0600 Dyspnea, unspecified: Secondary | ICD-10-CM

## 2024-07-15 NOTE — Progress Notes (Signed)
 Pulmonary Individual Treatment Plan  Patient Details  Name: Craig [name] MRN: [id number] Date of Birth: 08-22-1990 Referring Provider:   Flowsheet Row Pulmonary Rehab from 07/15/2024 in [facility] Cardiac and Pulmonary Rehab  Referring Provider Dr. Halina Picking    Initial Encounter Date:  Flowsheet Row Pulmonary Rehab from 07/15/2024 in [facility] Cardiac and Pulmonary Rehab  Date 07/15/24    Visit Diagnosis: Dyspnea, unspecified type  Patient's Home Medications on Admission:  Current Outpatient Medications:    acetaminophen  (TYLENOL ) 650 MG CR tablet, Take 650 mg by mouth every 8 (eight) hours as needed., Disp: , Rfl:    albuterol  (ACCUNEB ) 1.25 MG/3ML nebulizer solution, Take 1 ampule by nebulization every 6 (six) hours as needed for wheezing or shortness of breath. (Patient not taking: Reported on 07/09/2024), Disp: , Rfl:    Allopurinol  200 MG TABS, Take 400 mg by mouth daily. (Patient not taking: Reported on 07/09/2024), Disp: , Rfl:    amLODipine  (NORVASC ) 5 MG tablet, Take 5 mg by mouth daily. (Patient not taking: Reported on 07/09/2024), Disp: , Rfl:    ascorbic acid  (VITAMIN C ) 500 MG tablet, Take 2 tablets (1,000 mg total) by mouth at bedtime. (Patient not taking: Reported on 07/09/2024), Disp: , Rfl:    blood glucose meter kit and supplies KIT, Dispense based on patient and insurance preference. Use up to four times daily as directed. (Patient not taking: Reported on 07/09/2024), Disp: 1 each, Rfl: 0   cefadroxil  (DURICEF) 500 MG capsule, Take 500 mg by mouth daily. (Patient not taking: Reported on 07/09/2024), Disp: , Rfl:    cetirizine  (ZYRTEC ) 10 MG tablet, Take 10 mg by mouth daily. (Patient not taking: Reported on 07/09/2024), Disp: , Rfl:    Cholecalciferol  (VITAMIN D3) 25 MCG (1000 UT) CAPS, Take 1 capsule by mouth daily. (Patient not taking: Reported on 07/09/2024), Disp: , Rfl:    clonazePAM  (KLONOPIN ) 1 MG tablet, Take 1 mg by mouth 2 (two) times daily as needed.  (Patient not taking: Reported on 07/09/2024), Disp: , Rfl:    colchicine  0.6 MG tablet, Take 0.6 mg by mouth daily. (Patient not taking: Reported on 07/09/2024), Disp: , Rfl:    docusate sodium  (COLACE) 100 MG capsule, Take 1 capsule (100 mg total) by mouth 2 (two) times daily as needed for mild constipation. (Patient not taking: Reported on 07/09/2024), Disp: 10 capsule, Rfl: 0   DULoxetine  (CYMBALTA ) 30 MG capsule, Take 30 mg by mouth daily. Take along with one 60 mg capsule for total 90 mg once daily (Patient not taking: Reported on 07/09/2024), Disp: , Rfl:    DULoxetine  (CYMBALTA ) 60 MG capsule, Take 60 mg by mouth daily. Take along with one 30 mg capsule for total 90 mg once daily (Patient not taking: Reported on 07/09/2024), Disp: , Rfl:    folic acid  (FOLVITE ) 1 MG tablet, Take 1 mg by mouth daily. (Patient not taking: Reported on 07/09/2024), Disp: , Rfl:    hydroxychloroquine  (PLAQUENIL ) 200 MG tablet, Take 200 mg by mouth 2 (two) times daily. (Patient not taking: Reported on 07/09/2024), Disp: , Rfl:    metoprolol  succinate (TOPROL -XL) 50 MG 24 hr tablet, Take 3 tablets (150 mg total) by mouth daily. Take with or immediately following a meal. (Patient not taking: Reported on 07/09/2024), Disp: 60 tablet, Rfl: 1   metoprolol  succinate (TOPROL -XL) 50 MG 24 hr tablet, 3 tablets Orally Once a day; Duration: 90 days (Patient not taking: Reported on 07/09/2024), Disp: , Rfl:    nicotine  (NICODERM CQ  -  DOSED IN MG/24 HOURS) 21 mg/24hr patch, Place 1 patch (21 mg total) onto the skin daily. (Patient not taking: Reported on 07/09/2024), Disp: 28 patch, Rfl: 0   nystatin  (MYCOSTATIN /NYSTOP ) powder, Apply topically 3 (three) times daily as needed. (Patient not taking: Reported on 07/09/2024), Disp: , Rfl:    ondansetron  (ZOFRAN ) 8 MG tablet, 1 tablet on the tongue and allow to dissolve as needed Orally every 6 hours as needed; Duration: 30 days (Patient not taking: Reported on 07/09/2024), Disp: , Rfl:     ondansetron  (ZOFRAN -ODT) 4 MG disintegrating tablet, Take 4 mg by mouth every 8 (eight) hours as needed. (Patient not taking: Reported on 07/09/2024), Disp: , Rfl:    oxyCODONE  (OXY IR/ROXICODONE ) 5 MG immediate release tablet, Take 5 mg by mouth every 4 (four) hours as needed. (Patient not taking: Reported on 07/09/2024), Disp: , Rfl:    OXYGEN, 2.5 LPM [place] PRN, Disp: , Rfl:    OXYGEN, 2.5 LPM  [place] PRN, Disp: , Rfl:    polyethylene glycol (MIRALAX  / GLYCOLAX ) 17 g packet, Take 17 g by mouth daily. (Patient not taking: Reported on 07/09/2024), Disp: 14 each, Rfl: 0   potassium chloride  SA (KLOR-CON  M) 20 MEQ tablet, Take 1 tablet (20 mEq total) by mouth 2 (two) times daily. (Patient not taking: Reported on 07/09/2024), Disp: 60 tablet, Rfl: 1   pregabalin  (LYRICA ) 200 MG capsule, Take 200 mg by mouth 2 (two) times daily., Disp: , Rfl:    rivaroxaban  (XARELTO ) 20 MG TABS tablet, Take 20 mg by mouth daily with supper. (Patient not taking: Reported on 07/09/2024), Disp: , Rfl:    rivaroxaban  (XARELTO ) 20 MG TABS tablet, 1 tablet with food Orally Once a day; Duration: 90 days (Patient not taking: Reported on 07/09/2024), Disp: , Rfl:    rOPINIRole  (REQUIP ) 0.25 MG tablet, Take 1 tablet (0.25 mg total) by mouth at bedtime. (Patient not taking: Reported on 07/09/2024), Disp: 30 tablet, Rfl: 0   saccharomyces boulardii (FLORASTOR) 250 MG capsule, Take 250 mg by mouth 2 (two) times daily. (Patient not taking: Reported on 07/09/2024), Disp: , Rfl:    testosterone cypionate (DEPOTESTOSTERONE CYPIONATE) 200 MG/ML injection, Inject 100 mg into the muscle once a week. (Patient not taking: Reported on 07/09/2024), Disp: , Rfl:    zinc  sulfate 220 (50 Zn) MG capsule, Take 220 mg by mouth daily. (Patient not taking: Reported on 07/09/2024), Disp: , Rfl:   Past Medical History: Past Medical History:  Diagnosis Date   Gout    Hypertension    Rupture of bowel (HCC)     Tobacco Use: Social History   Tobacco Use   Smoking Status Never  Smokeless Tobacco Current   Types: Chew  Tobacco Comments   Not ready to quit.     Labs: Review Flowsheet  More data exists      Latest Ref Rng & Units 07/18/2021 08/09/2021 08/11/2021 08/14/2021 08/28/2021  Labs for ITP Cardiac and Pulmonary Rehab  Trlycerides <150 mg/dL - - 850  731  -  PH, Arterial 7.350 - 7.450 - 7.37  - - -  PCO2 arterial 32.0 - 48.0 mmHg - 63  - - -  Bicarbonate 20.0 - 28.0 mmol/L - 36.4  - - 43.6   TCO2 22 - 32 mmol/L 41  - - - -  O2 Saturation % - 98.3  - - 57.9      Pulmonary Assessment Scores:  Pulmonary Assessment Scores     Row Name 07/15/24 1123  ADL UCSD   ADL Phase Entry       mMRC Score   mMRC Score 2        UCSD: Self-administered rating of dyspnea associated with activities of daily living (ADLs) 6-point scale (0 = not at all to 5 = maximal or unable to do because of breathlessness)  Scoring Scores range from 0 to 120.  Minimally important difference is 5 units  CAT: CAT can identify the health impairment of COPD patients and is better correlated with disease progression.  CAT has a scoring range of zero to 40. The CAT score is classified into four groups of low (less than 10), medium (10 - 20), high (21-30) and very high (31-40) based on the impact level of disease on health status. A CAT score over 10 suggests significant symptoms.  A worsening CAT score could be explained by an exacerbation, poor medication adherence, poor inhaler technique, or progression of COPD or comorbid conditions.  CAT MCID is 2 points  mMRC: mMRC (Modified Medical Research Council) Dyspnea Scale is used to assess the degree of baseline functional disability in patients of respiratory disease due to dyspnea. No minimal important difference is established. A decrease in score of 1 point or greater is considered a positive change.   Pulmonary Function Assessment:  Pulmonary Function Assessment - 07/09/24 0943       Breath    Shortness of Breath Yes;Limiting activity          Exercise Target Goals: Exercise Program Goal: Individual exercise prescription set using results from initial 6 min walk test and THRR while considering  patient's activity barriers and safety.   Exercise Prescription Goal: Initial exercise prescription builds to 30-45 minutes a day of aerobic activity, 2-3 days per week.  Home exercise guidelines will be given to patient during program as part of exercise prescription that the participant will acknowledge.  Education: Aerobic Exercise: - Group verbal and visual presentation on the components of exercise prescription. Introduces F.I.T.T principle from ACSM for exercise prescriptions.  Reviews F.I.T.T. principles of aerobic exercise including progression. Written material provided at class time.   Education: Resistance Exercise: - Group verbal and visual presentation on the components of exercise prescription. Introduces F.I.T.T principle from ACSM for exercise prescriptions  Reviews F.I.T.T. principles of resistance exercise including progression. Written material provided at class time.    Education: Exercise & Equipment Safety: - Individual verbal instruction and demonstration of equipment use and safety with use of the equipment. Flowsheet Row Pulmonary Rehab from 07/09/2024 in [facility] Cardiac and Pulmonary Rehab  Date 07/09/24  Educator jh  Instruction Review Code 1- Verbalizes Understanding    Education: Exercise Physiology & General Exercise Guidelines: - Group verbal and written instruction with models to review the exercise physiology of the cardiovascular system and associated critical values. Provides general exercise guidelines with specific guidelines to those with heart or lung disease.    Education: Flexibility, Balance, Mind/Body Relaxation: - Group verbal and visual presentation with interactive activity on the components of exercise prescription. Introduces F.I.T.T  principle from ACSM for exercise prescriptions. Reviews F.I.T.T. principles of flexibility and balance exercise training including progression. Also discusses the mind body connection.  Reviews various relaxation techniques to help reduce and manage stress (i.e. Deep breathing, progressive muscle relaxation, and visualization). Balance handout provided to take home. Written material provided at class time.   Activity Barriers & Risk Stratification:  Activity Barriers & Cardiac Risk Stratification - 07/15/24 1118  Activity Barriers & Cardiac Risk Stratification   Activity Barriers Right Knee Replacement;Left Knee Replacement;Muscular Weakness;Shortness of Breath;Assistive Device;Balance Concerns;Deconditioning;Joint Problems;Arthritis          6 Minute Walk:  6 Minute Walk     Row Name 07/15/24 1115         6 Minute Walk   Phase Initial     Distance 705 feet     Walk Time 6 minutes     # of Rest Breaks 0     MPH 1.34     METS 4.01     RPE 12     Perceived Dyspnea  3     VO2 Peak 14.03     Symptoms Yes (comment)     Comments used walker, leg pain, ankle/foot pain 6/10     Resting HR 113 bpm     Resting BP 142/96     Resting Oxygen Saturation  95 %     Exercise Oxygen Saturation  during 6 min walk 90 %     Max Ex. HR 143 bpm     Max Ex. BP 164/98     2 Minute Post BP 156/98       Interval HR   1 Minute HR 134     2 Minute HR 139     3 Minute HR 140     4 Minute HR 141     5 Minute HR 143     6 Minute HR 141     2 Minute Post HR 123     Interval Heart Rate? Yes       Interval Oxygen   Interval Oxygen? Yes     Baseline Oxygen Saturation % 95 %     1 Minute Oxygen Saturation % 94 %     1 Minute Liters of Oxygen 0 L  RA     2 Minute Oxygen Saturation % 91 %     2 Minute Liters of Oxygen 0 L     3 Minute Oxygen Saturation % 92 %     3 Minute Liters of Oxygen 0 L     4 Minute Oxygen Saturation % 91 %     4 Minute Liters of Oxygen 0 L     5 Minute Oxygen  Saturation % 91 %     5 Minute Liters of Oxygen 0 L     6 Minute Oxygen Saturation % 90 %     6 Minute Liters of Oxygen 0 L     2 Minute Post Oxygen Saturation % 94 %     2 Minute Post Liters of Oxygen 0 L       Oxygen Initial Assessment:  Oxygen Initial Assessment - 07/09/24 0942       Home Oxygen   Home Oxygen Device Home Concentrator;E-Tanks    Sleep Oxygen Prescription Continuous    Liters per minute 6    Home Exercise Oxygen Prescription None    Home Resting Oxygen Prescription None    Compliance with Home Oxygen Use Yes      Initial 6 min Walk   Oxygen Used None      Program Oxygen Prescription   Program Oxygen Prescription None      Intervention   Short Term Goals To learn and exhibit compliance with exercise, home and travel O2 prescription;To learn and understand importance of monitoring SPO2 with pulse oximeter and demonstrate accurate use of the pulse oximeter.;To learn and understand importance of maintaining oxygen  saturations>88%;To learn and demonstrate proper pursed lip breathing techniques or other breathing techniques. ;To learn and demonstrate proper use of respiratory medications    Long  Term Goals Exhibits compliance with exercise, home  and travel O2 prescription;Verbalizes importance of monitoring SPO2 with pulse oximeter and return demonstration;Maintenance of O2 saturations>88%;Exhibits proper breathing techniques, such as pursed lip breathing or other method taught during program session;Compliance with respiratory medication;Demonstrates proper use of MDI's          Oxygen Re-Evaluation:   Oxygen Discharge (Final Oxygen Re-Evaluation):   Initial Exercise Prescription:  Initial Exercise Prescription - 07/15/24 1100       Date of Initial Exercise RX and Referring Provider   Date 07/15/24    Referring Provider Dr. Fuad Aleskerov      Oxygen   Maintain Oxygen Saturation 88% or higher      NuStep   Level 2    SPM 50    Minutes 15    METs  4.01      Arm Ergometer   Level 1    Watts 25    RPM 25    Minutes 15    METs 4.01      Biostep-RELP   Level 1    SPM 50    Minutes 15    METs 4.01      Track   Laps 10    Minutes 15    METs 1.54      Prescription Details   Frequency (times per week) 2    Duration Progress to 30 minutes of continuous aerobic without signs/symptoms of physical distress      Intensity   THRR 40-80% of Max Heartrate 141-170    Ratings of Perceived Exertion 11-13    Perceived Dyspnea 0-4      Progression   Progression Continue to progress workloads to maintain intensity without signs/symptoms of physical distress.      Resistance Training   Training Prescription Yes    Weight 3 lb    Reps 10-15          Perform Capillary Blood Glucose checks as needed.  Exercise Prescription Changes:   Exercise Prescription Changes     Row Name 07/15/24 1100             Response to Exercise   Blood Pressure (Admit) 142/96       Blood Pressure (Exercise) 164/98       Blood Pressure (Exit) 156/98       Heart Rate (Admit) 113 bpm       Heart Rate (Exercise) 143 bpm       Heart Rate (Exit) 123 bpm       Oxygen Saturation (Admit) 95 %       Oxygen Saturation (Exercise) 90 %       Oxygen Saturation (Exit) 94 %       Rating of Perceived Exertion (Exercise) 12       Perceived Dyspnea (Exercise) 3       Symptoms used walker, leg pain, ankle/foot pain 6/10       Comments Results          Exercise Comments:   Exercise Goals and Review:   Exercise Goals     Row Name 07/15/24 1118             Exercise Goals   Increase Physical Activity Yes       Intervention Provide advice, education, support and counseling about physical activity/exercise needs.;Develop an  individualized exercise prescription for aerobic and resistive training based on initial evaluation findings, risk stratification, comorbidities and participant's personal goals.       Expected Outcomes Short Term: Attend  rehab on a regular basis to increase amount of physical activity.;Long Term: Add in home exercise to make exercise part of routine and to increase amount of physical activity.;Long Term: Exercising regularly at least 3-5 days a week.       Increase Strength and Stamina Yes       Intervention Provide advice, education, support and counseling about physical activity/exercise needs.;Develop an individualized exercise prescription for aerobic and resistive training based on initial evaluation findings, risk stratification, comorbidities and participant's personal goals.       Expected Outcomes Short Term: Perform resistance training exercises routinely during rehab and add in resistance training at home;Long Term: Improve cardiorespiratory fitness, muscular endurance and strength as measured by increased METs and functional capacity ( );Short Term: Increase workloads from initial exercise prescription for resistance, speed, and METs.       Able to understand and use rate of perceived exertion (RPE) scale Yes       Intervention Provide education and explanation on how to use RPE scale       Expected Outcomes Short Term: Able to use RPE daily in rehab to express subjective intensity level;Long Term:  Able to use RPE to guide intensity level when exercising independently       Able to understand and use Dyspnea scale Yes       Intervention Provide education and explanation on how to use Dyspnea scale       Expected Outcomes Short Term: Able to use Dyspnea scale daily in rehab to express subjective sense of shortness of breath during exertion;Long Term: Able to use Dyspnea scale to guide intensity level when exercising independently       Knowledge and understanding of Target Heart Rate Range (THRR) Yes       Intervention Provide education and explanation of THRR including how the numbers were predicted and where they are located for reference       Expected Outcomes Short Term: Able to state/look up  THRR;Short Term: Able to use daily as guideline for intensity in rehab;Long Term: Able to use THRR to govern intensity when exercising independently       Able to check pulse independently Yes       Intervention Provide education and demonstration on how to check pulse in carotid and radial arteries.;Review the importance of being able to check your own pulse for safety during independent exercise       Expected Outcomes Short Term: Able to explain why pulse checking is important during independent exercise;Long Term: Able to check pulse independently and accurately       Understanding of Exercise Prescription Yes       Intervention Provide education, explanation, and written materials on patient's individual exercise prescription       Expected Outcomes Short Term: Able to explain program exercise prescription;Long Term: Able to explain home exercise prescription to exercise independently          Exercise Goals Re-Evaluation :   Discharge Exercise Prescription (Final Exercise Prescription Changes):  Exercise Prescription Changes - 07/15/24 1100       Response to Exercise   Blood Pressure (Admit) 142/96    Blood Pressure (Exercise) 164/98    Blood Pressure (Exit) 156/98    Heart Rate (Admit) 113 bpm    Heart Rate (Exercise) 143 bpm  Heart Rate (Exit) 123 bpm    Oxygen Saturation (Admit) 95 %    Oxygen Saturation (Exercise) 90 %    Oxygen Saturation (Exit) 94 %    Rating of Perceived Exertion (Exercise) 12    Perceived Dyspnea (Exercise) 3    Symptoms used walker, leg pain, ankle/foot pain 6/10    Comments Results          Nutrition:  Target Goals: Understanding of nutrition guidelines, daily intake of sodium 1500mg , cholesterol 200mg , calories 30% from fat and 7% or less from saturated fats, daily to have 5 or more servings of fruits and vegetables.  Education: Nutrition 1 -Group instruction provided by verbal, written material, interactive activities, discussions,  models, and posters to present general guidelines for heart healthy nutrition including macronutrients, label reading, and promoting whole foods over processed counterparts. Education serves as pensions consultant of discussion of heart healthy eating for all. Written material provided at class time.     Education: Nutrition 2 -Group instruction provided by verbal, written material, interactive activities, discussions, models, and posters to present general guidelines for heart healthy nutrition including sodium, cholesterol, and saturated fat. Providing guidance of habit forming to improve blood pressure, cholesterol, and body weight. Written material provided at class time.     Biometrics:  Pre Biometrics - 07/15/24 1119       Pre Biometrics   Height 6' 1 (1.854 m)    Weight 277 lb 3.2 oz (125.7 kg)    Waist Circumference 47.5 inches    Hip Circumference 47 inches    Waist to Hip Ratio 1.01 %    BMI (Calculated) 36.58    Single Leg Stand 0 seconds           Nutrition Therapy Plan and Nutrition Goals:  Nutrition Therapy & Goals - 07/15/24 1114       Intervention Plan   Intervention Prescribe, educate and counsel regarding individualized specific dietary modifications aiming towards targeted core components such as weight, hypertension, lipid management, diabetes, heart failure and other comorbidities.    Expected Outcomes Long Term Goal: Adherence to prescribed nutrition plan.;Short Term Goal: A plan has been developed with personal nutrition goals set during dietitian appointment.;Short Term Goal: Understand basic principles of dietary content, such as calories, fat, sodium, cholesterol and nutrients.          Nutrition Assessments:  MEDIFICTS Score Key: >=70 Need to make dietary changes  40-70 Heart Healthy Diet <= 40 Therapeutic Level Cholesterol Diet  Flowsheet Row Pulmonary Rehab from 07/15/2024 in [facility] Cardiac and Pulmonary Rehab  Picture Your Plate Total Score on  Admission 51   Picture Your Plate Scores: <59 Unhealthy dietary pattern with much room for improvement. 41-50 Dietary pattern unlikely to meet recommendations for good health and room for improvement. 51-60 More healthful dietary pattern, with some room for improvement.  >60 Healthy dietary pattern, although there may be some specific behaviors that could be improved.   Nutrition Goals Re-Evaluation:   Nutrition Goals Discharge (Final Nutrition Goals Re-Evaluation):   Psychosocial: Target Goals: Acknowledge presence or absence of significant depression and/or stress, maximize coping skills, provide positive support system. Participant is able to verbalize types and ability to use techniques and skills needed for reducing stress and depression.   Education: Stress, Anxiety, and Depression - Group verbal and visual presentation to define topics covered.  Reviews how body is impacted by stress, anxiety, and depression.  Also discusses healthy ways to reduce stress and to treat/manage anxiety and depression.  Written material provided at class time.   Education: Sleep Hygiene -Provides group verbal and written instruction about how sleep can affect your health.  Define sleep hygiene, discuss sleep cycles and impact of sleep habits. Review good sleep hygiene tips.    Initial Review & Psychosocial Screening:  Initial Psych Review & Screening - 07/09/24 0946       Initial Review   Current issues with Current Psychotropic Meds;Current Stress Concerns;Current Depression;History of Depression;Current Anxiety/Panic    Source of Stress Concerns Unable to perform yard/household activities    Comments Patient has has a history of alocohol abuse and depression. He states his medication is helping with his modd and is excited for Pulmonary rehab.      Family Dynamics   Good Support System? Yes    Comments He can look to his wife, dad, mother and friends for support.      Barriers    Psychosocial barriers to participate in program The patient should benefit from training in stress management and relaxation.;There are no identifiable barriers or psychosocial needs.      Screening Interventions   Interventions Encouraged to exercise;To provide support and resources with identified psychosocial needs;Provide feedback about the scores to participant    Expected Outcomes Short Term goal: Utilizing psychosocial counselor, staff and physician to assist with identification of specific Stressors or current issues interfering with healing process. Setting desired goal for each stressor or current issue identified.;Long Term Goal: Stressors or current issues are controlled or eliminated.;Short Term goal: Identification and review with participant of any Quality of Life or Depression concerns found by scoring the questionnaire.;Long Term goal: The participant improves quality of Life and PHQ9 Scores as seen by post scores and/or verbalization of changes          Quality of Life Scores:  Scores of 19 and below usually indicate a poorer quality of life in these areas.  A difference of  2-3 points is a clinically meaningful difference.  A difference of 2-3 points in the total score of the Quality of Life Index has been associated with significant improvement in overall quality of life, self-image, physical symptoms, and general health in studies assessing change in quality of life.  PHQ-9: Review Flowsheet       07/15/2024  Depression screen PHQ 2/9  Decreased Interest 1  Down, Depressed, Hopeless 1  PHQ - 2 Score 2  Altered sleeping 1  Tired, decreased energy 2  Change in appetite 0  Feeling bad or failure about yourself  1  Trouble concentrating 0  Moving slowly or fidgety/restless 0  Suicidal thoughts 0  PHQ-9 Score 6  Difficult doing work/chores Very difficult   Interpretation of Total Score  Total Score Depression Severity:  1-4 = Minimal depression, 5-9 = Mild  depression, 10-14 = Moderate depression, 15-19 = Moderately severe depression, 20-27 = Severe depression   Psychosocial Evaluation and Intervention:  Psychosocial Evaluation - 07/09/24 0951       Psychosocial Evaluation & Interventions   Interventions Encouraged to exercise with the program and follow exercise prescription;Relaxation education    Comments Patient has has a history of alocohol abuse and depression. He states his medication is helping with his modd and is excited for Pulmonary rehab.    Expected Outcomes Short: Start [facility] to help with mood. Long: Maintain a healthy mental state    Continue Psychosocial Services  Follow up required by staff          Psychosocial Re-Evaluation:  Psychosocial Discharge (Final Psychosocial Re-Evaluation):   Education: Education Goals: Education classes will be provided on a weekly basis, covering required topics. Participant will state understanding/return demonstration of topics presented.  Learning Barriers/Preferences:  Learning Barriers/Preferences - 07/09/24 0943       Learning Barriers/Preferences   Learning Barriers None    Learning Preferences None          General Pulmonary Education Topics:  Infection Prevention: - Provides verbal and written material to individual with discussion of infection control including proper hand washing and proper equipment cleaning during exercise session. Flowsheet Row Pulmonary Rehab from 07/09/2024 in [facility] Cardiac and Pulmonary Rehab  Date 07/09/24  Educator jh  Instruction Review Code 1- Verbalizes Understanding    Falls Prevention: - Provides verbal and written material to individual with discussion of falls prevention and safety. Flowsheet Row Pulmonary Rehab from 07/09/2024 in [facility] Cardiac and Pulmonary Rehab  Date 07/09/24  Educator jh  Instruction Review Code 1- Verbalizes Understanding    Chronic Lung Disease Review: - Group verbal instruction with posters,  models, PowerPoint presentations and videos,  to review new updates, new respiratory medications, new advancements in procedures and treatments. Providing information on websites and 800 numbers for continued self-education. Includes information about supplement oxygen, available portable oxygen systems, continuous and intermittent flow rates, oxygen safety, concentrators, and Medicare reimbursement for oxygen. Explanation of Pulmonary Drugs, including class, frequency, complications, importance of spacers, rinsing mouth after steroid MDI's, and proper cleaning methods for nebulizers. Review of basic lung anatomy and physiology related to function, structure, and complications of lung disease. Review of risk factors. Discussion about methods for diagnosing sleep apnea and types of masks and machines for OSA. Includes a review of the use of types of environmental controls: home humidity, furnaces, filters, dust mite/pet prevention, HEPA vacuums. Discussion about weather changes, air quality and the benefits of nasal washing. Instruction on Warning signs, infection symptoms, calling MD promptly, preventive modes, and value of vaccinations. Review of effective airway clearance, coughing and/or vibration techniques. Emphasizing that all should Create an Action Plan. Written material provided at class time.   AED/CPR: - Group verbal and written instruction with the use of models to demonstrate the basic use of the AED with the basic ABC's of resuscitation.    Tests and Procedures:  - Group verbal and visual presentation and models provide information about basic cardiac anatomy and function. Reviews the testing methods done to diagnose heart disease and the outcomes of the test results. Describes the treatment choices: Medical Management, Angioplasty, or Coronary Bypass Surgery for treating various heart conditions including Myocardial Infarction, Angina, Valve Disease, and Cardiac Arrhythmias.  Written  material provided at class time.   Medication Safety: - Group verbal and visual instruction to review commonly prescribed medications for heart and lung disease. Reviews the medication, class of the drug, and side effects. Includes the steps to properly store meds and maintain the prescription regimen.  Written material given at graduation.   Other: -Provides group and verbal instruction on various topics (see comments)   Knowledge Questionnaire Score:    Core Components/Risk Factors/Patient Goals at Admission:  Personal Goals and Risk Factors at Admission - 07/09/24 0943       Core Components/Risk Factors/Patient Goals on Admission    Weight Management Yes;Weight Maintenance    Intervention Weight Management: Develop a combined nutrition and exercise program designed to reach desired caloric intake, while maintaining appropriate intake of nutrient and fiber, sodium and fats, and appropriate energy expenditure required  for the weight goal.;Weight Management: Provide education and appropriate resources to help participant work on and attain dietary goals.;Weight Management/Obesity: Establish reasonable short term and long term weight goals.;Obesity: Provide education and appropriate resources to help participant work on and attain dietary goals.    Expected Outcomes Short Term: Continue to assess and modify interventions until short term weight is achieved;Long Term: Adherence to nutrition and physical activity/exercise program aimed toward attainment of established weight goal;Weight Maintenance: Understanding of the daily nutrition guidelines, which includes 25-35% calories from fat, 7% or less cal from saturated fats, less than 200mg  cholesterol, less than 1.5gm of sodium, & 5 or more servings of fruits and vegetables daily;Understanding recommendations for meals to include 15-35% energy as protein, 25-35% energy from fat, 35-60% energy from carbohydrates, less than 200mg  of dietary  cholesterol, 20-35 gm of total fiber daily;Understanding of distribution of calorie intake throughout the day with the consumption of 4-5 meals/snacks    Tobacco Cessation Yes    Number of packs per day Patient does dip and does not have an interest in quitting.    Intervention Assist the participant in steps to quit. Provide individualized education and counseling about committing to Tobacco Cessation, relapse prevention, and pharmacological support that can be provided by physician.;Education officer, environmental, assist with locating and accessing local/national Quit Smoking programs, and support quit date choice.    Expected Outcomes Short Term: Will demonstrate readiness to quit, by selecting a quit date.;Long Term: Complete abstinence from all tobacco products for at least 12 months from quit date.;Short Term: Will quit all tobacco product use, adhering to prevention of relapse plan.    Improve shortness of breath with ADL's Yes    Intervention Provide education, individualized exercise plan and daily activity instruction to help decrease symptoms of SOB with activities of daily living.    Expected Outcomes Short Term: Improve cardiorespiratory fitness to achieve a reduction of symptoms when performing ADLs;Long Term: Be able to perform more ADLs without symptoms or delay the onset of symptoms          Education:Diabetes - Individual verbal and written instruction to review signs/symptoms of diabetes, desired ranges of glucose level fasting, after meals and with exercise. Acknowledge that pre and post exercise glucose checks will be done for 3 sessions at entry of program.   Know Your Numbers and Heart Failure: - Group verbal and visual instruction to discuss disease risk factors for cardiac and pulmonary disease and treatment options.  Reviews associated critical values for Overweight/Obesity, Hypertension, Cholesterol, and Diabetes.  Discusses basics of heart failure: signs/symptoms and  treatments.  Introduces Heart Failure Zone chart for action plan for heart failure. Written material provided at class time.   Core Components/Risk Factors/Patient Goals Review:    Core Components/Risk Factors/Patient Goals at Discharge (Final Review):    ITP Comments:  ITP Comments     Row Name 07/09/24 0955 07/15/24 1112         ITP Comments Virtual Visit completed. Patient informed on EP and RD appointment and 6 Minute walk test. Patient also informed of patient health questionnaires on My Chart. Patient Verbalizes understanding. Visit diagnosis can be found in [facility] 06/24/2024. Completed and gym orientation for respiratory care services. Initial ITP created and sent for review to Dr. Fuad Aleskerov, Medical Director.         Comments: Initial ITP

## 2024-07-15 NOTE — Patient Instructions (Signed)
 Patient Instructions  Patient Details  Name: Craig [name] MRN: [id number] Date of Birth: Jan 24, 1991 Referring Provider:  Parris Manna, MD  Below are your personal goals for exercise, nutrition, and risk factors. Our goal is to help you stay on track towards obtaining and maintaining these goals. We will be discussing your progress on these goals with you throughout the program.  Initial Exercise Prescription:  Initial Exercise Prescription - 07/15/24 1100       Date of Initial Exercise RX and Referring Provider   Date 07/15/24    Referring Provider Dr. Manna Parris      Oxygen   Maintain Oxygen Saturation 88% or higher      NuStep   Level 2    SPM 50    Minutes 15    METs 4.01      Arm Ergometer   Level 1    Watts 25    RPM 25    Minutes 15    METs 4.01      Biostep-RELP   Level 1    SPM 50    Minutes 15    METs 4.01      Track   Laps 10    Minutes 15    METs 1.54      Prescription Details   Frequency (times per week) 2    Duration Progress to 30 minutes of continuous aerobic without signs/symptoms of physical distress      Intensity   THRR 40-80% of Max Heartrate 141-170    Ratings of Perceived Exertion 11-13    Perceived Dyspnea 0-4      Progression   Progression Continue to progress workloads to maintain intensity without signs/symptoms of physical distress.      Resistance Training   Training Prescription Yes    Weight 3 lb    Reps 10-15          Exercise Goals: Frequency: Be able to perform aerobic exercise two to three times per week in program working toward 2-5 days per week of home exercise.  Intensity: Work with a perceived exertion of 11 (fairly light) - 15 (hard) while following your exercise prescription.  We will make changes to your prescription with you as you progress through the program.   Duration: Be able to do 30 to 45 minutes of continuous aerobic exercise in addition to a 5 minute warm-up and a 5 minute cool-down  routine.   Nutrition Goals: Your personal nutrition goals will be established when you do your nutrition analysis with the dietician.  The following are general nutrition guidelines to follow: Cholesterol < 200mg /day Sodium < 1500mg /day Fiber: Men under 50 yrs - 38 grams per day  Personal Goals:  Personal Goals and Risk Factors at Admission - 07/09/24 0943       Core Components/Risk Factors/Patient Goals on Admission    Weight Management Yes;Weight Maintenance    Intervention Weight Management: Develop a combined nutrition and exercise program designed to reach desired caloric intake, while maintaining appropriate intake of nutrient and fiber, sodium and fats, and appropriate energy expenditure required for the weight goal.;Weight Management: Provide education and appropriate resources to help participant work on and attain dietary goals.;Weight Management/Obesity: Establish reasonable short term and long term weight goals.;Obesity: Provide education and appropriate resources to help participant work on and attain dietary goals.    Expected Outcomes Short Term: Continue to assess and modify interventions until short term weight is achieved;Long Term: Adherence to nutrition and physical activity/exercise program aimed  toward attainment of established weight goal;Weight Maintenance: Understanding of the daily nutrition guidelines, which includes 25-35% calories from fat, 7% or less cal from saturated fats, less than 200mg  cholesterol, less than 1.5gm of sodium, & 5 or more servings of fruits and vegetables daily;Understanding recommendations for meals to include 15-35% energy as protein, 25-35% energy from fat, 35-60% energy from carbohydrates, less than 200mg  of dietary cholesterol, 20-35 gm of total fiber daily;Understanding of distribution of calorie intake throughout the day with the consumption of 4-5 meals/snacks    Tobacco Cessation Yes    Number of packs per day Patient does dip and does not  have an interest in quitting.    Intervention Assist the participant in steps to quit. Provide individualized education and counseling about committing to Tobacco Cessation, relapse prevention, and pharmacological support that can be provided by physician.;Education officer, environmental, assist with locating and accessing local/national Quit Smoking programs, and support quit date choice.    Expected Outcomes Short Term: Will demonstrate readiness to quit, by selecting a quit date.;Long Term: Complete abstinence from all tobacco products for at least 12 months from quit date.;Short Term: Will quit all tobacco product use, adhering to prevention of relapse plan.    Improve shortness of breath with ADL's Yes    Intervention Provide education, individualized exercise plan and daily activity instruction to help decrease symptoms of SOB with activities of daily living.    Expected Outcomes Short Term: Improve cardiorespiratory fitness to achieve a reduction of symptoms when performing ADLs;Long Term: Be able to perform more ADLs without symptoms or delay the onset of symptoms         Exercise Goals and Review:  Exercise Goals     Row Name 07/15/24 1118             Exercise Goals   Increase Physical Activity Yes       Intervention Provide advice, education, support and counseling about physical activity/exercise needs.;Develop an individualized exercise prescription for aerobic and resistive training based on initial evaluation findings, risk stratification, comorbidities and participant's personal goals.       Expected Outcomes Short Term: Attend rehab on a regular basis to increase amount of physical activity.;Long Term: Add in home exercise to make exercise part of routine and to increase amount of physical activity.;Long Term: Exercising regularly at least 3-5 days a week.       Increase Strength and Stamina Yes       Intervention Provide advice, education, support and counseling about physical  activity/exercise needs.;Develop an individualized exercise prescription for aerobic and resistive training based on initial evaluation findings, risk stratification, comorbidities and participant's personal goals.       Expected Outcomes Short Term: Perform resistance training exercises routinely during rehab and add in resistance training at home;Long Term: Improve cardiorespiratory fitness, muscular endurance and strength as measured by increased METs and functional capacity ( );Short Term: Increase workloads from initial exercise prescription for resistance, speed, and METs.       Able to understand and use rate of perceived exertion (RPE) scale Yes       Intervention Provide education and explanation on how to use RPE scale       Expected Outcomes Short Term: Able to use RPE daily in rehab to express subjective intensity level;Long Term:  Able to use RPE to guide intensity level when exercising independently       Able to understand and use Dyspnea scale Yes       Intervention Provide  education and explanation on how to use Dyspnea scale       Expected Outcomes Short Term: Able to use Dyspnea scale daily in rehab to express subjective sense of shortness of breath during exertion;Long Term: Able to use Dyspnea scale to guide intensity level when exercising independently       Knowledge and understanding of Target Heart Rate Range (THRR) Yes       Intervention Provide education and explanation of THRR including how the numbers were predicted and where they are located for reference       Expected Outcomes Short Term: Able to state/look up THRR;Short Term: Able to use daily as guideline for intensity in rehab;Long Term: Able to use THRR to govern intensity when exercising independently       Able to check pulse independently Yes       Intervention Provide education and demonstration on how to check pulse in carotid and radial arteries.;Review the importance of being able to check your own pulse for  safety during independent exercise       Expected Outcomes Short Term: Able to explain why pulse checking is important during independent exercise;Long Term: Able to check pulse independently and accurately       Understanding of Exercise Prescription Yes       Intervention Provide education, explanation, and written materials on patient's individual exercise prescription       Expected Outcomes Short Term: Able to explain program exercise prescription;Long Term: Able to explain home exercise prescription to exercise independently

## 2024-07-21 ENCOUNTER — Encounter

## 2024-07-22 ENCOUNTER — Encounter

## 2024-07-22 DIAGNOSIS — R0600 Dyspnea, unspecified: Secondary | ICD-10-CM | POA: Insufficient documentation

## 2024-07-22 NOTE — Progress Notes (Signed)
 Incomplete Session Note  Patient Details  Name: JIMY GATES MRN: [id number] Date of Birth: 02/21/1991 Referring Provider:   Flowsheet Row Pulmonary Rehab from 07/15/2024 in [facility] Cardiac and Pulmonary Rehab  Referring Provider Dr. Halina Picking    Lonni ONEIDA Sharper did not complete his rehab session due to issues with his colostomy. Education on what to expect for a typical session reviewed with patient and wife.

## 2024-07-22 NOTE — Progress Notes (Deleted)
 Daily Session Note  Patient Details  Name: Craig [name] MRN: [id number] Date of Birth: Dec 31, 1990 Referring Provider:   Flowsheet Row Pulmonary Rehab from 07/15/2024 in [facility] Cardiac and Pulmonary Rehab  Referring Provider Dr. Halina Picking    Encounter Date: 07/22/2024  Check In:  Session Check In - 07/22/24 1731       Check-In   Supervising physician immediately available to respond to emergencies See telemetry face sheet for immediately available ER MD    Location [facility]-Cardiac & Pulmonary Rehab    Staff Present Leita Franks RN,BSN;Joseph [facility] BS, ACSM CEP, Exercise Physiologist    Virtual Visit No    Medication changes reported     Yes    Comments start metoprolol  150mg  daily    Fall or balance concerns reported    No    Warm-up and Cool-down Performed on first and last piece of equipment    Resistance Training Performed Yes    VAD Patient? No    PAD/SET Patient? No      Pain Assessment   Currently in Pain? No/denies             Social History   Tobacco Use  Smoking Status Never  Smokeless Tobacco Current   Types: Chew  Tobacco Comments   Not ready to quit.     Goals Met:  Proper associated with RPD/PD & O2 Sat Independence with exercise equipment Using PLB without cueing & demonstrates good technique Exercise tolerated well No report of concerns or symptoms today Strength training completed today  Goals Unmet:  Not Applicable  Comments: First full day of exercise!  Patient was oriented to gym and equipment including functions, settings, policies, and procedures.  Patient's individual exercise prescription and treatment plan were reviewed.  All starting workloads were established based on the results of the 6 minute walk test done at initial orientation visit.  The plan for exercise progression was also introduced and progression will be customized based on patient's performance and goals.    Dr. Oneil Pinal is Medical  Director for [facility] Cardiac Rehabilitation.  Dr. Fuad Aleskerov is Medical Director for [facility] Pulmonary Rehabilitation.

## 2024-07-23 ENCOUNTER — Encounter: Admitting: Emergency Medicine

## 2024-07-23 DIAGNOSIS — R0600 Dyspnea, unspecified: Secondary | ICD-10-CM

## 2024-07-23 NOTE — Progress Notes (Signed)
 Daily Session Note  Patient Details  Name: Craig [name] MRN: [id number] Date of Birth: [birth year] Referring Provider:   Flowsheet Row Pulmonary Rehab from 07/15/2024 in [facility] Cardiac and Pulmonary Rehab  Referring Provider Dr. Halina Picking    Encounter Date: 07/23/2024  Check In:  Session Check In - 07/23/24 1149       Check-In   Supervising physician immediately available to respond to emergencies See telemetry face sheet for immediately available ER MD    Location [facility]-Cardiac & Pulmonary Rehab    Staff Present Leita Franks RN,BSN;Joseph Rolinda RCP,RRT,BSRT;Jason Elnor RDN,LDN;Laureen Delores, BS, RRT, CPFT    Virtual Visit No    Medication changes reported     No    Fall or balance concerns reported    No    Warm-up and Cool-down Performed on first and last piece of equipment    Resistance Training Performed Yes    VAD Patient? No    PAD/SET Patient? No      Pain Assessment   Currently in Pain? No/denies             Social History   Tobacco Use  Smoking Status Never  Smokeless Tobacco Current   Types: Chew  Tobacco Comments   Not ready to quit.     Goals Met:  Proper associated with RPD/PD & O2 Sat Independence with exercise equipment Using PLB without cueing & demonstrates good technique Exercise tolerated well No report of concerns or symptoms today Strength training completed today  Goals Unmet:  Not Applicable  Comments: First full day of exercise!  Patient was oriented to gym and equipment including functions, settings, policies, and procedures.  Patient's individual exercise prescription and treatment plan were reviewed.  All starting workloads were established based on the results of the 6 minute walk test done at initial orientation visit.  The plan for exercise progression was also introduced and progression will be customized based on patient's performance and goals.    Dr. Oneil Pinal is Medical Director for [facility] Cardiac  Rehabilitation.  Dr. Fuad Aleskerov is Medical Director for [facility] Pulmonary Rehabilitation.

## 2024-07-28 ENCOUNTER — Encounter

## 2024-07-29 ENCOUNTER — Encounter

## 2024-07-30 ENCOUNTER — Encounter

## 2024-08-03 ENCOUNTER — Encounter

## 2024-08-03 DIAGNOSIS — R0600 Dyspnea, unspecified: Secondary | ICD-10-CM | POA: Diagnosis not present

## 2024-08-03 NOTE — Progress Notes (Signed)
 Daily Session Note  Patient Details  Name: Craig [name] MRN: [id number] Date of Birth: 02-27-1991 Referring Provider:   Flowsheet Row Pulmonary Rehab from 07/15/2024 in [facility]  Referring Provider Dr. Halina Picking    Encounter Date: 08/03/2024  Check In:  Session Check In - 08/03/24 1752       Check-In   Supervising physician immediately available to respond to emergencies See telemetry face sheet for immediately available ER MD    Location [facility]-Cardiac & Pulmonary [facility]    Staff Present Leita Franks RN,BSN;[facility] Dyane BS, ACSM CEP, Exercise Physiologist    Virtual Visit No    Medication changes reported     No    Fall or balance concerns reported    No    Warm-up and Cool-down Performed on first and last piece of equipment    Resistance Training Performed Yes    VAD Patient? No    PAD/SET Patient? No      Pain Assessment   Currently in Pain? No/denies             Tobacco Use History[1]  Goals Met:  Proper associated with RPD/PD & O2 Sat Independence with exercise equipment Using PLB without cueing & demonstrates good technique Exercise tolerated well No report of concerns or symptoms today Strength training completed today  Goals Unmet:  Not Applicable  Comments: Pt able to follow exercise prescription today without complaint.  Will continue to monitor for progression.    Dr. Oneil Pinal is Medical Director for [facility].  Dr. Fuad Aleskerov is Medical Director for [facility].    [1]  Social History Tobacco Use  Smoking Status Never  Smokeless Tobacco Current   Types: Chew  Tobacco Comments   Not ready to quit.

## 2024-08-04 ENCOUNTER — Encounter

## 2024-08-05 DIAGNOSIS — J9611 Chronic respiratory failure with hypoxia: Secondary | ICD-10-CM

## 2024-08-05 DIAGNOSIS — R0600 Dyspnea, unspecified: Secondary | ICD-10-CM

## 2024-08-05 DIAGNOSIS — J8410 Pulmonary fibrosis, unspecified: Secondary | ICD-10-CM

## 2024-08-05 NOTE — Progress Notes (Signed)
 Pulmonary Individual Treatment Plan  Patient Details  Name: Craig [name] MRN: [id number] Date of Birth: 1990-09-10 Referring Provider:   Flowsheet Row Pulmonary Rehab from 07/15/2024 in [facility] Cardiac and Pulmonary Rehab  Referring Provider Dr. Halina Picking    Initial Encounter Date:  Flowsheet Row Pulmonary Rehab from 07/15/2024 in [facility] Cardiac and Pulmonary Rehab  Date 07/15/24    Visit Diagnosis: Dyspnea, unspecified type  Pulmonary fibrosis (HCC)  Chronic hypoxemic respiratory failure (HCC)  Patient's Home Medications on Admission: Current Medications[1]  Past Medical History: Past Medical History:  Diagnosis Date   Gout    Hypertension    Rupture of bowel (HCC)     Tobacco Use: Tobacco Use History[2]  Labs: Review Flowsheet  More data exists      Latest Ref Rng & Units 07/18/2021 08/09/2021 08/11/2021 08/14/2021 08/28/2021  Labs for ITP Cardiac and Pulmonary Rehab  Trlycerides <150 mg/dL - - 850  731  -  PH, Arterial 7.350 - 7.450 - 7.37  - - -  PCO2 arterial 32.0 - 48.0 mmHg - 63  - - -  Bicarbonate 20.0 - 28.0 mmol/L - 36.4  - - 43.6   TCO2 22 - 32 mmol/L 41  - - - -  O2 Saturation % - 98.3  - - 57.9      Pulmonary Assessment Scores:  Pulmonary Assessment Scores     Row Name 07/15/24 1123 07/22/24 1810       ADL UCSD   ADL Phase Entry --    SOB Score total -- 83    Rest -- 0    Walk -- 1    Stairs -- 5    Bath -- 3    Dress -- 3    Shop -- 4      CAT Score   CAT Score -- 17      mMRC Score   mMRC Score 2 --       UCSD: Self-administered rating of dyspnea associated with activities of daily living (ADLs) 6-point scale (0 = not at all to 5 = maximal or unable to do because of breathlessness)  Scoring Scores range from 0 to 120.  Minimally important difference is 5 units  CAT: CAT can identify the health impairment of COPD patients and is better correlated with disease progression.  CAT has a scoring range of zero to 40. The  CAT score is classified into four groups of low (less than 10), medium (10 - 20), high (21-30) and very high (31-40) based on the impact level of disease on health status. A CAT score over 10 suggests significant symptoms.  A worsening CAT score could be explained by an exacerbation, poor medication adherence, poor inhaler technique, or progression of COPD or comorbid conditions.  CAT MCID is 2 points  mMRC: mMRC (Modified Medical Research Council) Dyspnea Scale is used to assess the degree of baseline functional disability in patients of respiratory disease due to dyspnea. No minimal important difference is established. A decrease in score of 1 point or greater is considered a positive change.   Pulmonary Function Assessment:  Pulmonary Function Assessment - 07/09/24 0943       Breath   Shortness of Breath Yes;Limiting activity          Exercise Target Goals: Exercise Program Goal: Individual exercise prescription set using results from initial 6 min walk test and THRR while considering  patients activity barriers and safety.   Exercise Prescription Goal: Initial exercise prescription builds to  30-45 minutes a day of aerobic activity, 2-3 days per week.  Home exercise guidelines will be given to patient during program as part of exercise prescription that the participant will acknowledge.  Education: Aerobic Exercise: - Group verbal and visual presentation on the components of exercise prescription. Introduces F.I.T.T principle from ACSM for exercise prescriptions.  Reviews F.I.T.T. principles of aerobic exercise including progression. Written material provided at class time.   Education: Resistance Exercise: - Group verbal and visual presentation on the components of exercise prescription. Introduces F.I.T.T principle from ACSM for exercise prescriptions  Reviews F.I.T.T. principles of resistance exercise including progression. Written material provided at class time.     Education: Exercise & Equipment Safety: - Individual verbal instruction and demonstration of equipment use and safety with use of the equipment. Flowsheet Row Pulmonary Rehab from 07/09/2024 in [facility] Cardiac and Pulmonary Rehab  Date 07/09/24  Educator jh  Instruction Review Code 1- Verbalizes Understanding    Education: Exercise Physiology & General Exercise Guidelines: - Group verbal and written instruction with models to review the exercise physiology of the cardiovascular system and associated critical values. Provides general exercise guidelines with specific guidelines to those with heart or lung disease.    Education: Flexibility, Balance, Mind/Body Relaxation: - Group verbal and visual presentation with interactive activity on the components of exercise prescription. Introduces F.I.T.T principle from ACSM for exercise prescriptions. Reviews F.I.T.T. principles of flexibility and balance exercise training including progression. Also discusses the mind body connection.  Reviews various relaxation techniques to help reduce and manage stress (i.e. Deep breathing, progressive muscle relaxation, and visualization). Balance handout provided to take home. Written material provided at class time.   Activity Barriers & Risk Stratification:  Activity Barriers & Cardiac Risk Stratification - 07/15/24 1118       Activity Barriers & Cardiac Risk Stratification   Activity Barriers Right Knee Replacement;Left Knee Replacement;Muscular Weakness;Shortness of Breath;Assistive Device;Balance Concerns;Deconditioning;Joint Problems;Arthritis          6 Minute Walk:  6 Minute Walk     Row Name 07/15/24 1115         6 Minute Walk   Phase Initial     Distance 705 feet     Walk Time 6 minutes     # of Rest Breaks 0     MPH 1.34     METS 4.01     RPE 12     Perceived Dyspnea  3     VO2 Peak 14.03     Symptoms Yes (comment)     Comments used walker, leg pain, ankle/foot pain 6/10      Resting HR 113 bpm     Resting BP 142/96     Resting Oxygen Saturation  95 %     Exercise Oxygen Saturation  during 6 min walk 90 %     Max Ex. HR 143 bpm     Max Ex. BP 164/98     2 Minute Post BP 156/98       Interval HR   1 Minute HR 134     2 Minute HR 139     3 Minute HR 140     4 Minute HR 141     5 Minute HR 143     6 Minute HR 141     2 Minute Post HR 123     Interval Heart Rate? Yes       Interval Oxygen   Interval Oxygen? Yes     Baseline Oxygen  Saturation % 95 %     1 Minute Oxygen Saturation % 94 %     1 Minute Liters of Oxygen 0 L  RA     2 Minute Oxygen Saturation % 91 %     2 Minute Liters of Oxygen 0 L     3 Minute Oxygen Saturation % 92 %     3 Minute Liters of Oxygen 0 L     4 Minute Oxygen Saturation % 91 %     4 Minute Liters of Oxygen 0 L     5 Minute Oxygen Saturation % 91 %     5 Minute Liters of Oxygen 0 L     6 Minute Oxygen Saturation % 90 %     6 Minute Liters of Oxygen 0 L     2 Minute Post Oxygen Saturation % 94 %     2 Minute Post Liters of Oxygen 0 L       Oxygen Initial Assessment:  Oxygen Initial Assessment - 07/09/24 0942       Home Oxygen   Home Oxygen Device Home Concentrator;E-Tanks    Sleep Oxygen Prescription Continuous    Liters per minute 6    Home Exercise Oxygen Prescription None    Home Resting Oxygen Prescription None    Compliance with Home Oxygen Use Yes      Initial 6 min Walk   Oxygen Used None      Program Oxygen Prescription   Program Oxygen Prescription None      Intervention   Short Term Goals To learn and exhibit compliance with exercise, home and travel O2 prescription;To learn and understand importance of monitoring SPO2 with pulse oximeter and demonstrate accurate use of the pulse oximeter.;To learn and understand importance of maintaining oxygen saturations>88%;To learn and demonstrate proper pursed lip breathing techniques or other breathing techniques. ;To learn and demonstrate proper use of  respiratory medications    Long  Term Goals Exhibits compliance with exercise, home  and travel O2 prescription;Verbalizes importance of monitoring SPO2 with pulse oximeter and return demonstration;Maintenance of O2 saturations>88%;Exhibits proper breathing techniques, such as pursed lip breathing or other method taught during program session;Compliance with respiratory medication;Demonstrates proper use of MDIs          Oxygen Re-Evaluation:  Oxygen Re-Evaluation     Row Name 07/22/24 1734 07/23/24 1151           Goals/Expected Outcomes   Comments -- Reviewed PLB technique with pt.  Talked about how it works and it's importance in maintaining their exercise saturations.      Goals/Expected Outcomes -- Short: Become more profiecient at using PLB.   Long: Become independent at using PLB.         Oxygen Discharge (Final Oxygen Re-Evaluation):  Oxygen Re-Evaluation - 07/23/24 1151       Goals/Expected Outcomes   Comments Reviewed PLB technique with pt.  Talked about how it works and it's importance in maintaining their exercise saturations.    Goals/Expected Outcomes Short: Become more profiecient at using PLB.   Long: Become independent at using PLB.          Initial Exercise Prescription:  Initial Exercise Prescription - 07/15/24 1100       Date of Initial Exercise RX and Referring Provider   Date 07/15/24    Referring Provider Dr. Fuad Aleskerov      Oxygen   Maintain Oxygen Saturation 88% or higher      NuStep  Level 2    SPM 50    Minutes 15    METs 4.01      Arm Ergometer   Level 1    Watts 25    RPM 25    Minutes 15    METs 4.01      Biostep-RELP   Level 1    SPM 50    Minutes 15    METs 4.01      Track   Laps 10    Minutes 15    METs 1.54      Prescription Details   Frequency (times per week) 2    Duration Progress to 30 minutes of continuous aerobic without signs/symptoms of physical distress      Intensity   THRR 40-80% of Max  Heartrate 141-170    Ratings of Perceived Exertion 11-13    Perceived Dyspnea 0-4      Progression   Progression Continue to progress workloads to maintain intensity without signs/symptoms of physical distress.      Resistance Training   Training Prescription Yes    Weight 5 lb    Reps 10-15          Perform Capillary Blood Glucose checks as needed.  Exercise Prescription Changes:   Exercise Prescription Changes     Row Name 07/15/24 1100 07/29/24 1100           Response to Exercise   Blood Pressure (Admit) 142/96 148/80      Blood Pressure (Exercise) 164/98 156/86      Blood Pressure (Exit) 156/98 148/80      Heart Rate (Admit) 113 bpm 94 bpm      Heart Rate (Exercise) 143 bpm 103 bpm      Heart Rate (Exit) 123 bpm 96 bpm      Oxygen Saturation (Admit) 95 % 95 %      Oxygen Saturation (Exercise) 90 % 89 %      Oxygen Saturation (Exit) 94 % 96 %      Rating of Perceived Exertion (Exercise) 12 14      Perceived Dyspnea (Exercise) 3 --      Symptoms used walker, leg pain, ankle/foot pain 6/10 none      Comments Results 1st day of exercise      Duration -- Progress to 30 minutes of  aerobic without signs/symptoms of physical distress      Intensity -- THRR unchanged        Progression   Progression -- Continue to progress workloads to maintain intensity without signs/symptoms of physical distress.      Average METs -- 2.49        Resistance Training   Training Prescription -- Yes      Weight -- 5 lb      Reps -- 10-15        Interval Training   Interval Training -- No        Biostep-RELP   Level -- 3      Minutes -- 15      METs -- 3        Track   Laps -- 18      Minutes -- 15      METs -- 1.98        Oxygen   Maintain Oxygen Saturation -- 88% or higher         Exercise Comments:   Exercise Comments     Row Name 07/23/24 1150  Exercise Comments First full day of exercise!  Patient was oriented to gym and equipment including  functions, settings, policies, and procedures.  Patient's individual exercise prescription and treatment plan were reviewed.  All starting workloads were established based on the results of the 6 minute walk test done at initial orientation visit.  The plan for exercise progression was also introduced and progression will be customized based on patient's performance and goals.          Exercise Goals and Review:   Exercise Goals     Row Name 07/15/24 1118             Exercise Goals   Increase Physical Activity Yes       Intervention Provide advice, education, support and counseling about physical activity/exercise needs.;Develop an individualized exercise prescription for aerobic and resistive training based on initial evaluation findings, risk stratification, comorbidities and participant's personal goals.       Expected Outcomes Short Term: Attend rehab on a regular basis to increase amount of physical activity.;Long Term: Add in home exercise to make exercise part of routine and to increase amount of physical activity.;Long Term: Exercising regularly at least 3-5 days a week.       Increase Strength and Stamina Yes       Intervention Provide advice, education, support and counseling about physical activity/exercise needs.;Develop an individualized exercise prescription for aerobic and resistive training based on initial evaluation findings, risk stratification, comorbidities and participant's personal goals.       Expected Outcomes Short Term: Perform resistance training exercises routinely during rehab and add in resistance training at home;Long Term: Improve cardiorespiratory fitness, muscular endurance and strength as measured by increased METs and functional capacity ( );Short Term: Increase workloads from initial exercise prescription for resistance, speed, and METs.       Able to understand and use rate of perceived exertion (RPE) scale Yes       Intervention Provide education and  explanation on how to use RPE scale       Expected Outcomes Short Term: Able to use RPE daily in rehab to express subjective intensity level;Long Term:  Able to use RPE to guide intensity level when exercising independently       Able to understand and use Dyspnea scale Yes       Intervention Provide education and explanation on how to use Dyspnea scale       Expected Outcomes Short Term: Able to use Dyspnea scale daily in rehab to express subjective sense of shortness of breath during exertion;Long Term: Able to use Dyspnea scale to guide intensity level when exercising independently       Knowledge and understanding of Target Heart Rate Range (THRR) Yes       Intervention Provide education and explanation of THRR including how the numbers were predicted and where they are located for reference       Expected Outcomes Short Term: Able to state/look up THRR;Short Term: Able to use daily as guideline for intensity in rehab;Long Term: Able to use THRR to govern intensity when exercising independently       Able to check pulse independently Yes       Intervention Provide education and demonstration on how to check pulse in carotid and radial arteries.;Review the importance of being able to check your own pulse for safety during independent exercise       Expected Outcomes Short Term: Able to explain why pulse checking is important during independent exercise;Long Term: Able  to check pulse independently and accurately       Understanding of Exercise Prescription Yes       Intervention Provide education, explanation, and written materials on patient's individual exercise prescription       Expected Outcomes Short Term: Able to explain program exercise prescription;Long Term: Able to explain home exercise prescription to exercise independently          Exercise Goals Re-Evaluation :  Exercise Goals Re-Evaluation     Row Name 07/22/24 1733 07/23/24 1150 07/29/24 1126         Exercise Goal  Re-Evaluation   Exercise Goals Review -- Increase Physical Activity;Able to understand and use rate of perceived exertion (RPE) scale;Knowledge and understanding of Target Heart Rate Range (THRR);Understanding of Exercise Prescription;Increase Strength and Stamina;Able to understand and use Dyspnea scale;Able to check pulse independently Increase Physical Activity;Understanding of Exercise Prescription;Increase Strength and Stamina     Comments -- Reviewed RPE and dyspnea scale, THR and program prescription with pt today.  Pt voiced understanding and was given a copy of goals to take home. Craig [name] is off to a good start in the program and completed his first day in this review period. He was able to walk 18 laps on the track. He worked at level 3 on the biostep. We will continue to monitor his progress in the program.     Expected Outcomes -- Short: Use RPE daily to regulate intensity.  Long: Follow program prescription in THR. Short: Continue to follow current exercise prescription. Long: Continue exercise to improve strength and stamina.        Discharge Exercise Prescription (Final Exercise Prescription Changes):  Exercise Prescription Changes - 07/29/24 1100       Response to Exercise   Blood Pressure (Admit) 148/80    Blood Pressure (Exercise) 156/86    Blood Pressure (Exit) 148/80    Heart Rate (Admit) 94 bpm    Heart Rate (Exercise) 103 bpm    Heart Rate (Exit) 96 bpm    Oxygen Saturation (Admit) 95 %    Oxygen Saturation (Exercise) 89 %    Oxygen Saturation (Exit) 96 %    Rating of Perceived Exertion (Exercise) 14    Symptoms none    Comments 1st day of exercise    Duration Progress to 30 minutes of  aerobic without signs/symptoms of physical distress    Intensity THRR unchanged      Progression   Progression Continue to progress workloads to maintain intensity without signs/symptoms of physical distress.    Average METs 2.49      Resistance Training   Training Prescription Yes     Weight 5 lb    Reps 10-15      Interval Training   Interval Training No      Biostep-RELP   Level 3    Minutes 15    METs 3      Track   Laps 18    Minutes 15    METs 1.98      Oxygen   Maintain Oxygen Saturation 88% or higher          Nutrition:  Target Goals: Understanding of nutrition guidelines, daily intake of sodium 1500mg , cholesterol 200mg , calories 30% from fat and 7% or less from saturated fats, daily to have 5 or more servings of fruits and vegetables.  Education: Nutrition 1 -Group instruction provided by verbal, written material, interactive activities, discussions, models, and posters to present general guidelines for heart healthy nutrition including  macronutrients, label reading, and promoting whole foods over processed counterparts. Education serves as pensions consultant of discussion of heart healthy eating for all. Written material provided at class time.     Education: Nutrition 2 -Group instruction provided by verbal, written material, interactive activities, discussions, models, and posters to present general guidelines for heart healthy nutrition including sodium, cholesterol, and saturated fat. Providing guidance of habit forming to improve blood pressure, cholesterol, and body weight. Written material provided at class time.     Biometrics:  Pre Biometrics - 07/15/24 1119       Pre Biometrics   Height 6' 1 (1.854 m)    Weight 277 lb 3.2 oz (125.7 kg)    Waist Circumference 47.5 inches    Hip Circumference 47 inches    Waist to Hip Ratio 1.01 %    BMI (Calculated) 36.58    Single Leg Stand 0 seconds           Nutrition Therapy Plan and Nutrition Goals:  Nutrition Therapy & Goals - 07/15/24 1114       Intervention Plan   Intervention Prescribe, educate and counsel regarding individualized specific dietary modifications aiming towards targeted core components such as weight, hypertension, lipid management, diabetes, heart failure and  other comorbidities.    Expected Outcomes Long Term Goal: Adherence to prescribed nutrition plan.;Short Term Goal: A plan has been developed with personal nutrition goals set during dietitian appointment.;Short Term Goal: Understand basic principles of dietary content, such as calories, fat, sodium, cholesterol and nutrients.          Nutrition Assessments:  MEDIFICTS Score Key: >=70 Need to make dietary changes  40-70 Heart Healthy Diet <= 40 Therapeutic Level Cholesterol Diet  Flowsheet Row Pulmonary Rehab from 07/15/2024 in [facility] Cardiac and Pulmonary Rehab  Picture Your Plate Total Score on Admission 51   Picture Your Plate Scores: <59 Unhealthy dietary pattern with much room for improvement. 41-50 Dietary pattern unlikely to meet recommendations for good health and room for improvement. 51-60 More healthful dietary pattern, with some room for improvement.  >60 Healthy dietary pattern, although there may be some specific behaviors that could be improved.   Nutrition Goals Re-Evaluation:   Nutrition Goals Discharge (Final Nutrition Goals Re-Evaluation):   Psychosocial: Target Goals: Acknowledge presence or absence of significant depression and/or stress, maximize coping skills, provide positive support system. Participant is able to verbalize types and ability to use techniques and skills needed for reducing stress and depression.   Education: Stress, Anxiety, and Depression - Group verbal and visual presentation to define topics covered.  Reviews how body is impacted by stress, anxiety, and depression.  Also discusses healthy ways to reduce stress and to treat/manage anxiety and depression.  Written material provided at class time.   Education: Sleep Hygiene -Provides group verbal and written instruction about how sleep can affect your health.  Define sleep hygiene, discuss sleep cycles and impact of sleep habits. Review good sleep hygiene tips.    Initial Review &  Psychosocial Screening:  Initial Psych Review & Screening - 07/09/24 0946       Initial Review   Current issues with Current Psychotropic Meds;Current Stress Concerns;Current Depression;History of Depression;Current Anxiety/Panic    Source of Stress Concerns Unable to perform yard/household activities    Comments Patient has has a history of alocohol abuse and depression. He states his medication is helping with his modd and is excited for Pulmonary rehab.      Family Dynamics   Good Support System?  Yes    Comments He can look to his wife, dad, mother and friends for support.      Barriers   Psychosocial barriers to participate in program The patient should benefit from training in stress management and relaxation.;There are no identifiable barriers or psychosocial needs.      Screening Interventions   Interventions Encouraged to exercise;To provide support and resources with identified psychosocial needs;Provide feedback about the scores to participant    Expected Outcomes Short Term goal: Utilizing psychosocial counselor, staff and physician to assist with identification of specific Stressors or current issues interfering with healing process. Setting desired goal for each stressor or current issue identified.;Long Term Goal: Stressors or current issues are controlled or eliminated.;Short Term goal: Identification and review with participant of any Quality of Life or Depression concerns found by scoring the questionnaire.;Long Term goal: The participant improves quality of Life and PHQ9 Scores as seen by post scores and/or verbalization of changes          Quality of Life Scores:  Scores of 19 and below usually indicate a poorer quality of life in these areas.  A difference of  2-3 points is a clinically meaningful difference.  A difference of 2-3 points in the total score of the Quality of Life Index has been associated with significant improvement in overall quality of life, self-image,  physical symptoms, and general health in studies assessing change in quality of life.  PHQ-9: Review Flowsheet       07/15/2024  Depression screen PHQ 2/9  Decreased Interest 1  Down, Depressed, Hopeless 1  PHQ - 2 Score 2  Altered sleeping 1  Tired, decreased energy 2  Change in appetite 0  Feeling bad or failure about yourself  1  Trouble concentrating 0  Moving slowly or fidgety/restless 0  Suicidal thoughts 0  PHQ-9 Score 6  Difficult doing work/chores Very difficult   Interpretation of Total Score  Total Score Depression Severity:  1-4 = Minimal depression, 5-9 = Mild depression, 10-14 = Moderate depression, 15-19 = Moderately severe depression, 20-27 = Severe depression   Psychosocial Evaluation and Intervention:  Psychosocial Evaluation - 07/09/24 0951       Psychosocial Evaluation & Interventions   Interventions Encouraged to exercise with the program and follow exercise prescription;Relaxation education    Comments Patient has has a history of alocohol abuse and depression. He states his medication is helping with his modd and is excited for Pulmonary rehab.    Expected Outcomes Short: Start [facility] to help with mood. Long: Maintain a healthy mental state    Continue Psychosocial Services  Follow up required by staff          Psychosocial Re-Evaluation:   Psychosocial Discharge (Final Psychosocial Re-Evaluation):   Education: Education Goals: Education classes will be provided on a weekly basis, covering required topics. Participant will state understanding/return demonstration of topics presented.  Learning Barriers/Preferences:  Learning Barriers/Preferences - 07/09/24 0943       Learning Barriers/Preferences   Learning Barriers None    Learning Preferences None          General Pulmonary Education Topics:  Infection Prevention: - Provides verbal and written material to individual with discussion of infection control including proper hand  washing and proper equipment cleaning during exercise session. Flowsheet Row Pulmonary Rehab from 07/09/2024 in [facility] Cardiac and Pulmonary Rehab  Date 07/09/24  Educator jh  Instruction Review Code 1- Verbalizes Understanding    Falls Prevention: - Provides verbal and written  material to individual with discussion of falls prevention and safety. Flowsheet Row Pulmonary Rehab from 07/09/2024 in [facility] Cardiac and Pulmonary Rehab  Date 07/09/24  Educator jh  Instruction Review Code 1- Verbalizes Understanding    Chronic Lung Disease Review: - Group verbal instruction with posters, models, PowerPoint presentations and videos,  to review new updates, new respiratory medications, new advancements in procedures and treatments. Providing information on websites and 800 numbers for continued self-education. Includes information about supplement oxygen, available portable oxygen systems, continuous and intermittent flow rates, oxygen safety, concentrators, and Medicare reimbursement for oxygen. Explanation of Pulmonary Drugs, including class, frequency, complications, importance of spacers, rinsing mouth after steroid MDI's, and proper cleaning methods for nebulizers. Review of basic lung anatomy and physiology related to function, structure, and complications of lung disease. Review of risk factors. Discussion about methods for diagnosing sleep apnea and types of masks and machines for OSA. Includes a review of the use of types of environmental controls: home humidity, furnaces, filters, dust mite/pet prevention, HEPA vacuums. Discussion about weather changes, air quality and the benefits of nasal washing. Instruction on Warning signs, infection symptoms, calling MD promptly, preventive modes, and value of vaccinations. Review of effective airway clearance, coughing and/or vibration techniques. Emphasizing that all should Create an Action Plan. Written material provided at class time.   AED/CPR: - Group  verbal and written instruction with the use of models to demonstrate the basic use of the AED with the basic ABC's of resuscitation.    Tests and Procedures:  - Group verbal and visual presentation and models provide information about basic cardiac anatomy and function. Reviews the testing methods done to diagnose heart disease and the outcomes of the test results. Describes the treatment choices: Medical Management, Angioplasty, or Coronary Bypass Surgery for treating various heart conditions including Myocardial Infarction, Angina, Valve Disease, and Cardiac Arrhythmias.  Written material provided at class time.   Medication Safety: - Group verbal and visual instruction to review commonly prescribed medications for heart and lung disease. Reviews the medication, class of the drug, and side effects. Includes the steps to properly store meds and maintain the prescription regimen.  Written material given at graduation.   Other: -Provides group and verbal instruction on various topics (see comments)   Knowledge Questionnaire Score:  Knowledge Questionnaire Score - 07/22/24 1815       Knowledge Questionnaire Score   Pre Score 13/18           Core Components/Risk Factors/Patient Goals at Admission:  Personal Goals and Risk Factors at Admission - 07/09/24 0943       Core Components/Risk Factors/Patient Goals on Admission    Weight Management Yes;Weight Maintenance    Intervention Weight Management: Develop a combined nutrition and exercise program designed to reach desired caloric intake, while maintaining appropriate intake of nutrient and fiber, sodium and fats, and appropriate energy expenditure required for the weight goal.;Weight Management: Provide education and appropriate resources to help participant work on and attain dietary goals.;Weight Management/Obesity: Establish reasonable short term and long term weight goals.;Obesity: Provide education and appropriate resources to help  participant work on and attain dietary goals.    Expected Outcomes Short Term: Continue to assess and modify interventions until short term weight is achieved;Long Term: Adherence to nutrition and physical activity/exercise program aimed toward attainment of established weight goal;Weight Maintenance: Understanding of the daily nutrition guidelines, which includes 25-35% calories from fat, 7% or less cal from saturated fats, less than 200mg  cholesterol, less than 1.5gm of  sodium, & 5 or more servings of fruits and vegetables daily;Understanding recommendations for meals to include 15-35% energy as protein, 25-35% energy from fat, 35-60% energy from carbohydrates, less than 200mg  of dietary cholesterol, 20-35 gm of total fiber daily;Understanding of distribution of calorie intake throughout the day with the consumption of 4-5 meals/snacks    Tobacco Cessation Yes    Number of packs per day Patient does dip and does not have an interest in quitting.    Intervention Assist the participant in steps to quit. Provide individualized education and counseling about committing to Tobacco Cessation, relapse prevention, and pharmacological support that can be provided by physician.;Education officer, environmental, assist with locating and accessing local/national Quit Smoking programs, and support quit date choice.    Expected Outcomes Short Term: Will demonstrate readiness to quit, by selecting a quit date.;Long Term: Complete abstinence from all tobacco products for at least 12 months from quit date.;Short Term: Will quit all tobacco product use, adhering to prevention of relapse plan.    Improve shortness of breath with ADL's Yes    Intervention Provide education, individualized exercise plan and daily activity instruction to help decrease symptoms of SOB with activities of daily living.    Expected Outcomes Short Term: Improve cardiorespiratory fitness to achieve a reduction of symptoms when performing ADLs;Long Term:  Be able to perform more ADLs without symptoms or delay the onset of symptoms          Education:Diabetes - Individual verbal and written instruction to review signs/symptoms of diabetes, desired ranges of glucose level fasting, after meals and with exercise. Acknowledge that pre and post exercise glucose checks will be done for 3 sessions at entry of program.   Know Your Numbers and Heart Failure: - Group verbal and visual instruction to discuss disease risk factors for cardiac and pulmonary disease and treatment options.  Reviews associated critical values for Overweight/Obesity, Hypertension, Cholesterol, and Diabetes.  Discusses basics of heart failure: signs/symptoms and treatments.  Introduces Heart Failure Zone chart for action plan for heart failure. Written material provided at class time.   Core Components/Risk Factors/Patient Goals Review:    Core Components/Risk Factors/Patient Goals at Discharge (Final Review):    ITP Comments:  ITP Comments     Row Name 07/09/24 0955 07/15/24 1112 07/22/24 1732 07/23/24 1150 08/05/24 0847   ITP Comments Virtual Visit completed. Patient informed on EP and RD appointment and 6 Minute walk test. Patient also informed of patient health questionnaires on My Chart. Patient Verbalizes understanding. Visit diagnosis can be found in [facility] 06/24/2024. Completed and gym orientation for respiratory care services. Initial ITP created and sent for review to Dr. Fuad Aleskerov, Medical Director. -- First full day of exercise!  Patient was oriented to gym and equipment including functions, settings, policies, and procedures.  Patient's individual exercise prescription and treatment plan were reviewed.  All starting workloads were established based on the results of the 6 minute walk test done at initial orientation visit.  The plan for exercise progression was also introduced and progression will be customized based on patient's performance and goals. 30 Day  review completed. Medical Director ITP review done, changes made as directed, and signed approval by Medical Director. New to program.      Comments: 30 day review      [1]  Current Outpatient Medications:    acetaminophen  (TYLENOL ) 650 MG CR tablet, Take 650 mg by mouth every 8 (eight) hours as needed., Disp: , Rfl:    albuterol  (  ACCUNEB ) 1.25 MG/3ML nebulizer solution, Take 1 ampule by nebulization every 6 (six) hours as needed for wheezing or shortness of breath. (Patient not taking: Reported on 07/09/2024), Disp: , Rfl:    Allopurinol  200 MG TABS, Take 400 mg by mouth daily. (Patient not taking: Reported on 07/09/2024), Disp: , Rfl:    amLODipine  (NORVASC ) 5 MG tablet, Take 5 mg by mouth daily. (Patient not taking: Reported on 07/09/2024), Disp: , Rfl:    ascorbic acid  (VITAMIN C ) 500 MG tablet, Take 2 tablets (1,000 mg total) by mouth at bedtime. (Patient not taking: Reported on 07/09/2024), Disp: , Rfl:    blood glucose meter kit and supplies KIT, Dispense based on patient and insurance preference. Use up to four times daily as directed. (Patient not taking: Reported on 07/09/2024), Disp: 1 each, Rfl: 0   cefadroxil  (DURICEF) 500 MG capsule, Take 500 mg by mouth daily. (Patient not taking: Reported on 07/09/2024), Disp: , Rfl:    cetirizine  (ZYRTEC ) 10 MG tablet, Take 10 mg by mouth daily. (Patient not taking: Reported on 07/09/2024), Disp: , Rfl:    Cholecalciferol  (VITAMIN D3) 25 MCG (1000 UT) CAPS, Take 1 capsule by mouth daily. (Patient not taking: Reported on 07/09/2024), Disp: , Rfl:    clonazePAM  (KLONOPIN ) 1 MG tablet, Take 1 mg by mouth 2 (two) times daily as needed. (Patient not taking: Reported on 07/09/2024), Disp: , Rfl:    colchicine  0.6 MG tablet, Take 0.6 mg by mouth daily. (Patient not taking: Reported on 07/09/2024), Disp: , Rfl:    docusate sodium  (COLACE) 100 MG capsule, Take 1 capsule (100 mg total) by mouth 2 (two) times daily as needed for mild constipation.  (Patient not taking: Reported on 07/09/2024), Disp: 10 capsule, Rfl: 0   DULoxetine  (CYMBALTA ) 30 MG capsule, Take 30 mg by mouth daily. Take along with one 60 mg capsule for total 90 mg once daily (Patient not taking: Reported on 07/09/2024), Disp: , Rfl:    DULoxetine  (CYMBALTA ) 60 MG capsule, Take 60 mg by mouth daily. Take along with one 30 mg capsule for total 90 mg once daily (Patient not taking: Reported on 07/09/2024), Disp: , Rfl:    folic acid  (FOLVITE ) 1 MG tablet, Take 1 mg by mouth daily. (Patient not taking: Reported on 07/09/2024), Disp: , Rfl:    hydroxychloroquine  (PLAQUENIL ) 200 MG tablet, Take 200 mg by mouth 2 (two) times daily. (Patient not taking: Reported on 07/09/2024), Disp: , Rfl:    metoprolol  succinate (TOPROL -XL) 50 MG 24 hr tablet, Take 3 tablets (150 mg total) by mouth daily. Take with or immediately following a meal. (Patient not taking: Reported on 07/09/2024), Disp: 60 tablet, Rfl: 1   metoprolol  succinate (TOPROL -XL) 50 MG 24 hr tablet, 3 tablets Orally Once a day; Duration: 90 days (Patient not taking: Reported on 07/09/2024), Disp: , Rfl:    nicotine  (NICODERM CQ  - DOSED IN MG/24 HOURS) 21 mg/24hr patch, Place 1 patch (21 mg total) onto the skin daily. (Patient not taking: Reported on 07/09/2024), Disp: 28 patch, Rfl: 0   nystatin  (MYCOSTATIN /NYSTOP ) powder, Apply topically 3 (three) times daily as needed. (Patient not taking: Reported on 07/09/2024), Disp: , Rfl:    ondansetron  (ZOFRAN ) 8 MG tablet, 1 tablet on the tongue and allow to dissolve as needed Orally every 6 hours as needed; Duration: 30 days (Patient not taking: Reported on 07/09/2024), Disp: , Rfl:    ondansetron  (ZOFRAN -ODT) 4 MG disintegrating tablet, Take 4 mg by mouth every 8 (eight) hours as  needed. (Patient not taking: Reported on 07/09/2024), Disp: , Rfl:    oxyCODONE  (OXY IR/ROXICODONE ) 5 MG immediate release tablet, Take 5 mg by mouth every 4 (four) hours as needed. (Patient not taking: Reported  on 07/09/2024), Disp: , Rfl:    OXYGEN, 2.5 LPM [place] PRN, Disp: , Rfl:    OXYGEN, 2.5 LPM [place] PRN, Disp: , Rfl:    polyethylene glycol (MIRALAX  / GLYCOLAX ) 17 g packet, Take 17 g by mouth daily. (Patient not taking: Reported on 07/09/2024), Disp: 14 each, Rfl: 0   potassium chloride  SA (KLOR-CON  M) 20 MEQ tablet, Take 1 tablet (20 mEq total) by mouth 2 (two) times daily. (Patient not taking: Reported on 07/09/2024), Disp: 60 tablet, Rfl: 1   pregabalin  (LYRICA ) 200 MG capsule, Take 200 mg by mouth 2 (two) times daily., Disp: , Rfl:    rivaroxaban  (XARELTO ) 20 MG TABS tablet, Take 20 mg by mouth daily with supper. (Patient not taking: Reported on 07/09/2024), Disp: , Rfl:    rivaroxaban  (XARELTO ) 20 MG TABS tablet, 1 tablet with food Orally Once a day; Duration: 90 days (Patient not taking: Reported on 07/09/2024), Disp: , Rfl:    rOPINIRole  (REQUIP ) 0.25 MG tablet, Take 1 tablet (0.25 mg total) by mouth at bedtime. (Patient not taking: Reported on 07/09/2024), Disp: 30 tablet, Rfl: 0   saccharomyces boulardii (FLORASTOR) 250 MG capsule, Take 250 mg by mouth 2 (two) times daily. (Patient not taking: Reported on 07/09/2024), Disp: , Rfl:    testosterone cypionate (DEPOTESTOSTERONE CYPIONATE) 200 MG/ML injection, Inject 100 mg into the muscle once a week. (Patient not taking: Reported on 07/09/2024), Disp: , Rfl:    zinc  sulfate 220 (50 Zn) MG capsule, Take 220 mg by mouth daily. (Patient not taking: Reported on 07/09/2024), Disp: , Rfl:  [2]  Social History Tobacco Use  Smoking Status Never  Smokeless Tobacco Current   Types: Chew  Tobacco Comments   Not ready to quit.

## 2024-08-06 ENCOUNTER — Encounter

## 2024-08-06 DIAGNOSIS — R0600 Dyspnea, unspecified: Secondary | ICD-10-CM | POA: Diagnosis not present

## 2024-08-06 NOTE — Progress Notes (Signed)
 Daily Session Note  Patient Details  Name: Craig [name] MRN: [id number] Date of Birth: 09-Nov-1990 Referring Provider:   Flowsheet Row Pulmonary Rehab from 07/15/2024 in [facility] Cardiac and Pulmonary Rehab  Referring Provider Dr. Halina Picking    Encounter Date: 08/06/2024  Check In:  Session Check In - 08/06/24 1732       Check-In   Supervising physician immediately available to respond to emergencies See telemetry face sheet for immediately available ER MD    Location [facility]-Cardiac & Pulmonary Rehab    Staff Present Leita Franks RN,BSN;Joseph Rolinda RCP,RRT,BSRT    Virtual Visit [name]    Medication changes reported     [name]    Fall or balance concerns reported    [name]    Warm-up and Cool-down Performed on first and last piece of equipment    Resistance Training Performed Yes    VAD Patient? [name]    PAD/SET Patient? [name]      Pain Assessment   Currently in Pain? [name]/denies             Tobacco Use History[1]  Goals Met:  Proper associated with RPD/PD & O2 Sat Independence with exercise equipment Using PLB without cueing & demonstrates good technique Exercise tolerated well [name] report of concerns or symptoms today Strength training completed today  Goals Unmet:  Not Applicable  Comments: Pt able to follow exercise prescription today without complaint.  Will continue to monitor for progression.    Dr. Oneil Pinal is Medical Director for [facility] Cardiac Rehabilitation.  Dr. Fuad Aleskerov is Medical Director for [facility] Pulmonary Rehabilitation.    [1]  Social History Tobacco Use  Smoking Status Never  Smokeless Tobacco Current   Types: Chew  Tobacco Comments   Not ready to quit.

## 2024-08-10 ENCOUNTER — Encounter

## 2024-08-10 DIAGNOSIS — R0600 Dyspnea, unspecified: Secondary | ICD-10-CM | POA: Diagnosis not present

## 2024-08-10 NOTE — Progress Notes (Signed)
 Daily Session Note  Patient Details  Name: Craig [name] MRN: [id number] Date of Birth: April 10, 1991 Referring Provider:   Flowsheet Row Pulmonary Rehab from 07/15/2024 in [facility] Cardiac and Pulmonary Rehab  Referring Provider Dr. Halina Picking    Encounter Date: 08/10/2024  Check In:  Session Check In - 08/10/24 1727       Check-In   Supervising physician immediately available to respond to emergencies See telemetry face sheet for immediately available ER MD    Location [facility]-Cardiac & Pulmonary Rehab    Staff Present Burnard [facility] Dyane BS, ACSM CEP, Exercise Physiologist;Kristen Coble RN,BC,MSN    Virtual Visit No    Medication changes reported     No    Fall or balance concerns reported    No    Warm-up and Cool-down Performed on first and last piece of equipment    Resistance Training Performed Yes    VAD Patient? No    PAD/SET Patient? No      Pain Assessment   Currently in Pain? No/denies             Tobacco Use History[1]  Goals Met:  Proper associated with RPD/PD & O2 Sat [place] with exercise equipment Using PLB without cueing & demonstrates good technique Exercise tolerated well No report of concerns or symptoms today Strength training completed today  Goals Unmet:  Not Applicable  Comments: Pt able to follow exercise prescription today without complaint.  Will continue to monitor for progression.    Dr. Oneil Pinal is Medical Director for [facility] Cardiac Rehabilitation.  Dr. Fuad Aleskerov is Medical Director for [facility] Pulmonary Rehabilitation.    [1]  Social History Tobacco Use  Smoking Status Never  Smokeless Tobacco Current   Types: Chew  Tobacco Comments   Not ready to quit.

## 2024-08-11 ENCOUNTER — Encounter

## 2024-08-17 ENCOUNTER — Encounter

## 2024-08-18 ENCOUNTER — Ambulatory Visit: Payer: Self-pay | Admitting: Podiatry

## 2024-08-18 ENCOUNTER — Encounter

## 2024-08-19 ENCOUNTER — Encounter

## 2024-08-20 ENCOUNTER — Encounter

## 2024-08-21 ENCOUNTER — Encounter: Attending: Pulmonary Disease | Admitting: Emergency Medicine

## 2024-08-21 DIAGNOSIS — R0600 Dyspnea, unspecified: Secondary | ICD-10-CM | POA: Diagnosis present

## 2024-08-21 NOTE — Progress Notes (Signed)
 Daily Session Note  Patient Details  Name: Craig [name] MRN: [id number] Date of Birth: August 21, 1990 Referring Provider:   Flowsheet Row Pulmonary Rehab from 07/15/2024 in [facility] Cardiac and Pulmonary Rehab  Referring Provider Dr. Halina Picking    Encounter Date: 08/21/2024  Check In:  Session Check In - 08/21/24 1113       Check-In   Supervising physician immediately available to respond to emergencies See telemetry face sheet for immediately available ER MD    Location [facility]-Cardiac & Pulmonary Rehab    Staff Present Leita Franks RN,BSN;Joseph Rolinda [place] HARMAN Cecilie Delores, [place], RRT, CPFT;Maxon Conetta BS, Exercise Physiologist    Virtual Visit No    Medication changes reported     No    Fall or balance concerns reported    No    Warm-up and Cool-down Performed on first and last piece of equipment    Resistance Training Performed Yes    VAD Patient? No    PAD/SET Patient? No      Pain Assessment   Currently in Pain? No/denies             Tobacco Use History[1]  Goals Met:  Proper associated with RPD/PD & O2 Sat [place] with exercise equipment Using PLB without cueing & demonstrates good technique Exercise tolerated well No report of concerns or symptoms today Strength training completed today  Goals Unmet:  Not Applicable  Comments: Pt able to follow exercise prescription today without complaint.  Will continue to monitor for progression.    Dr. Oneil Pinal is Medical Director for [facility] Cardiac Rehabilitation.  Dr. Fuad Aleskerov is Medical Director for [facility] Pulmonary Rehabilitation.    [1]  Social History Tobacco Use  Smoking Status Never  Smokeless Tobacco Current   Types: Chew  Tobacco Comments   Not ready to quit.

## 2024-08-24 ENCOUNTER — Encounter

## 2024-08-24 ENCOUNTER — Other Ambulatory Visit: Payer: Self-pay

## 2024-08-24 ENCOUNTER — Emergency Department
Admission: EM | Admit: 2024-08-24 | Discharge: 2024-08-24 | Attending: Emergency Medicine | Admitting: Emergency Medicine

## 2024-08-24 ENCOUNTER — Emergency Department

## 2024-08-24 DIAGNOSIS — E119 Type 2 diabetes mellitus without complications: Secondary | ICD-10-CM | POA: Insufficient documentation

## 2024-08-24 DIAGNOSIS — S0101XA Laceration without foreign body of scalp, initial encounter: Secondary | ICD-10-CM | POA: Insufficient documentation

## 2024-08-24 DIAGNOSIS — I10 Essential (primary) hypertension: Secondary | ICD-10-CM | POA: Insufficient documentation

## 2024-08-24 DIAGNOSIS — Z23 Encounter for immunization: Secondary | ICD-10-CM | POA: Insufficient documentation

## 2024-08-24 DIAGNOSIS — S0990XA Unspecified injury of head, initial encounter: Secondary | ICD-10-CM

## 2024-08-24 LAB — RESP PANEL BY RT-PCR (RSV, FLU A&B, COVID)  RVPGX2
Influenza A by PCR: NEGATIVE
Influenza B by PCR: NEGATIVE
Resp Syncytial Virus by PCR: NEGATIVE
SARS Coronavirus 2 by RT PCR: NEGATIVE

## 2024-08-24 LAB — CBG MONITORING, ED: Glucose-Capillary: 124 mg/dL — ABNORMAL HIGH (ref 70–99)

## 2024-08-24 MED ORDER — TETANUS-DIPHTH-ACELL PERTUSSIS 5-2-15.5 LF-MCG/0.5 IM SUSP
0.5000 mL | Freq: Once | INTRAMUSCULAR | Status: AC
  Administered 2024-08-24: 0.5 mL via INTRAMUSCULAR
  Filled 2024-08-24: qty 0.5
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
## 2024-08-24 NOTE — ED Notes (Signed)
 Patient has LLQ ostomy that was leaking upon arrival, cleaned and drained after copious amounts of soft stool oozed.  New ostomy in place with stoma ring to secure seal.
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
## 2024-08-24 NOTE — ED Notes (Signed)
 This RN and RN Vet cleaned and placed new ostomy bag on pt secure with ostomy ring

## 2024-08-25 ENCOUNTER — Encounter

## 2024-08-27 ENCOUNTER — Encounter

## 2024-08-31 ENCOUNTER — Encounter: Admitting: Emergency Medicine

## 2024-08-31 DIAGNOSIS — R0600 Dyspnea, unspecified: Secondary | ICD-10-CM | POA: Diagnosis not present

## 2024-08-31 NOTE — Progress Notes (Signed)
 Daily Session Note  Patient Details  Name: RUSS LOOPER MRN: [id number] Date of Birth: 12-19-1990 Referring Provider:   Flowsheet Row Pulmonary Rehab from 07/15/2024 in [facility]  Referring Provider Dr. Halina Picking    Encounter Date: 08/31/2024  Check In:  Session Check In - 08/31/24 1750       Check-In   Supervising physician immediately available to respond to emergencies See telemetry face sheet for immediately available ER MD    Location [facility] & Pul[facility]    Staff Present Leita Franks RN,BSN;Joseph [facility] Dyane BS, ACSM CEP, Exercise Physiologist    Virtual Visit No    Medication changes reported     No    Fall or balance concerns reported    No    Warm-up and Cool-down Performed on first and last piece of equipment    Resistance Training Performed Yes    VAD Patient? No    PAD/SET Patient? No      Pain Assessment   Currently in Pain? No/denies             Tobacco Use History[1]  Goals Met:  Proper associated with RPD/PD & O2 Sat Independence with exercise equipment Using PLB without cueing & demonstrates good technique Exercise tolerated well No report of concerns or symptoms today Strength training completed today  Goals Unmet:  Not Applicable  Comments: Pt able to follow exercise prescription today without complaint.  Will continue to monitor for progression.    Dr. Oneil Pinal is Medical Director for [facility].  Dr. Fuad Aleskerov is Medical Director for [facility] Pul[facility] Rehabilitation.    [1]  Social History Tobacco Use  Smoking Status Never  Smokeless Tobacco Current   Types: Chew  Tobacco Comments   Not ready to quit.

## 2024-09-01 ENCOUNTER — Encounter

## 2024-09-02 ENCOUNTER — Encounter: Payer: Self-pay | Admitting: *Deleted

## 2024-09-02 DIAGNOSIS — R0600 Dyspnea, unspecified: Secondary | ICD-10-CM

## 2024-09-02 NOTE — Progress Notes (Signed)
 Pulmonary Individual Treatment Plan  Patient Details  Name: Craig [name] MRN: [id number] Date of Birth: September 26, 1990 Referring Provider:   Flowsheet Row Pulmonary Rehab from 07/15/2024 in [facility] Cardiac and Pulmonary Rehab  Referring Provider Dr. Halina Picking    Initial Encounter Date:  Flowsheet Row Pulmonary Rehab from 07/15/2024 in [facility] Cardiac and Pulmonary Rehab  Date 07/15/24    Visit Diagnosis: Dyspnea, unspecified type  Patient's Home Medications on Admission: Current Medications[1]  Past Medical History: Past Medical History:  Diagnosis Date   Gout    Hypertension    Rupture of bowel (HCC)     Tobacco Use: Tobacco Use History[2]  Labs: Review Flowsheet  More data exists      Latest Ref Rng & Units 07/18/2021 08/09/2021 08/11/2021 08/14/2021 08/28/2021  Labs for ITP Cardiac and Pulmonary Rehab  Trlycerides <150 mg/dL - - 850  731  -  PH, Arterial 7.350 - 7.450 - 7.37  - - -  PCO2 arterial 32.0 - 48.0 mmHg - 63  - - -  Bicarbonate 20.0 - 28.0 mmol/L - 36.4  - - 43.6   TCO2 22 - 32 mmol/L 41  - - - -  O2 Saturation % - 98.3  - - 57.9      Pulmonary Assessment Scores:  Pulmonary Assessment Scores     Row Name 07/15/24 1123 07/22/24 1810       ADL UCSD   ADL Phase Entry --    SOB Score total -- 83    Rest -- 0    Walk -- 1    Stairs -- 5    Bath -- 3    Dress -- 3    Shop -- 4      CAT Score   CAT Score -- 17      mMRC Score   mMRC Score 2 --       UCSD: Self-administered rating of dyspnea associated with activities of daily living (ADLs) 6-point scale (0 = not at all to 5 = maximal or unable to do because of breathlessness)  Scoring Scores range from 0 to 120.  Minimally important difference is 5 units  CAT: CAT can identify the health impairment of COPD patients and is better correlated with disease progression.  CAT has a scoring range of zero to 40. The CAT score is classified into four groups of low (less than 10), medium (10  - 20), high (21-30) and very high (31-40) based on the impact level of disease on health status. A CAT score over 10 suggests significant symptoms.  A worsening CAT score could be explained by an exacerbation, poor medication adherence, poor inhaler technique, or progression of COPD or comorbid conditions.  CAT MCID is 2 points  mMRC: mMRC (Modified Medical Research Council) Dyspnea Scale is used to assess the degree of baseline functional disability in patients of respiratory disease due to dyspnea. No minimal important difference is established. A decrease in score of 1 point or greater is considered a positive change.   Pulmonary Function Assessment:  Pulmonary Function Assessment - 07/09/24 0943       Breath   Shortness of Breath Yes;Limiting activity          Exercise Target Goals: Exercise Program Goal: Individual exercise prescription set using results from initial 6 min walk test and THRR while considering  patients activity barriers and safety.   Exercise Prescription Goal: Initial exercise prescription builds to 30-45 minutes a day of aerobic activity, 2-3 days per  week.  Home exercise guidelines will be given to patient during program as part of exercise prescription that the participant will acknowledge.  Education: Aerobic Exercise: - Group verbal and visual presentation on the components of exercise prescription. Introduces F.I.T.T principle from ACSM for exercise prescriptions.  Reviews F.I.T.T. principles of aerobic exercise including progression. Written material provided at class time.   Education: Resistance Exercise: - Group verbal and visual presentation on the components of exercise prescription. Introduces F.I.T.T principle from ACSM for exercise prescriptions  Reviews F.I.T.T. principles of resistance exercise including progression. Written material provided at class time.    Education: Exercise & Equipment Safety: - Individual verbal instruction and  demonstration of equipment use and safety with use of the equipment. Flowsheet Row Pulmonary Rehab from 07/09/2024 in [facility] Cardiac and Pulmonary Rehab  Date 07/09/24  Educator jh  Instruction Review Code 1- Verbalizes Understanding    Education: Exercise Physiology & General Exercise Guidelines: - Group verbal and written instruction with models to review the exercise physiology of the cardiovascular system and associated critical values. Provides general exercise guidelines with specific guidelines to those with heart or lung disease.    Education: Flexibility, Balance, Mind/Body Relaxation: - Group verbal and visual presentation with interactive activity on the components of exercise prescription. Introduces F.I.T.T principle from ACSM for exercise prescriptions. Reviews F.I.T.T. principles of flexibility and balance exercise training including progression. Also discusses the mind body connection.  Reviews various relaxation techniques to help reduce and manage stress (i.e. Deep breathing, progressive muscle relaxation, and visualization). Balance handout provided to take home. Written material provided at class time.   Activity Barriers & Risk Stratification:  Activity Barriers & Cardiac Risk Stratification - 07/15/24 1118       Activity Barriers & Cardiac Risk Stratification   Activity Barriers Right Knee Replacement;Left Knee Replacement;Muscular Weakness;Shortness of Breath;Assistive Device;Balance Concerns;Deconditioning;Joint Problems;Arthritis          6 Minute Walk:  6 Minute Walk     Row Name 07/15/24 1115         6 Minute Walk   Phase Initial     Distance 705 feet     Walk Time 6 minutes     # of Rest Breaks 0     MPH 1.34     METS 4.01     RPE 12     Perceived Dyspnea  3     VO2 Peak 14.03     Symptoms Yes (comment)     Comments used walker, leg pain, ankle/foot pain 6/10     Resting HR 113 bpm     Resting BP 142/96     Resting Oxygen Saturation  95 %      Exercise Oxygen Saturation  during 6 min walk 90 %     Max Ex. HR 143 bpm     Max Ex. BP 164/98     2 Minute Post BP 156/98       Interval HR   1 Minute HR 134     2 Minute HR 139     3 Minute HR 140     4 Minute HR 141     5 Minute HR 143     6 Minute HR 141     2 Minute Post HR 123     Interval Heart Rate? Yes       Interval Oxygen   Interval Oxygen? Yes     Baseline Oxygen Saturation % 95 %     1 Minute  Oxygen Saturation % 94 %     1 Minute Liters of Oxygen 0 L  RA     2 Minute Oxygen Saturation % 91 %     2 Minute Liters of Oxygen 0 L     3 Minute Oxygen Saturation % 92 %     3 Minute Liters of Oxygen 0 L     4 Minute Oxygen Saturation % 91 %     4 Minute Liters of Oxygen 0 L     5 Minute Oxygen Saturation % 91 %     5 Minute Liters of Oxygen 0 L     6 Minute Oxygen Saturation % 90 %     6 Minute Liters of Oxygen 0 L     2 Minute Post Oxygen Saturation % 94 %     2 Minute Post Liters of Oxygen 0 L       Oxygen Initial Assessment:  Oxygen Initial Assessment - 07/09/24 0942       Home Oxygen   Home Oxygen Device Home Concentrator;E-Tanks    Sleep Oxygen Prescription Continuous    Liters per minute 6    Home Exercise Oxygen Prescription None    Home Resting Oxygen Prescription None    Compliance with Home Oxygen Use Yes      Initial 6 min Walk   Oxygen Used None      Program Oxygen Prescription   Program Oxygen Prescription None      Intervention   Short Term Goals To learn and exhibit compliance with exercise, home and travel O2 prescription;To learn and understand importance of monitoring SPO2 with pulse oximeter and demonstrate accurate use of the pulse oximeter.;To learn and understand importance of maintaining oxygen saturations>88%;To learn and demonstrate proper pursed lip breathing techniques or other breathing techniques. ;To learn and demonstrate proper use of respiratory medications    Long  Term Goals Exhibits compliance with exercise, home  and  travel O2 prescription;Verbalizes importance of monitoring SPO2 with pulse oximeter and return demonstration;Maintenance of O2 saturations>88%;Exhibits proper breathing techniques, such as pursed lip breathing or other method taught during program session;Compliance with respiratory medication;Demonstrates proper use of MDIs          Oxygen Re-Evaluation:  Oxygen Re-Evaluation     Row Name 07/22/24 1734 07/23/24 1151 08/31/24 1750         Program Oxygen Prescription   Program Oxygen Prescription -- -- None       Home Oxygen   Home Oxygen Device -- -- Home Concentrator;E-Tanks     Sleep Oxygen Prescription -- -- Continuous     Liters per minute -- -- 6     Home Exercise Oxygen Prescription -- -- None     Home Resting Oxygen Prescription -- -- None     Compliance with Home Oxygen Use -- -- Yes       Goals/Expected Outcomes   Short Term Goals -- -- To learn and understand importance of maintaining oxygen saturations>88%;To learn and understand importance of monitoring SPO2 with pulse oximeter and demonstrate accurate use of the pulse oximeter.;To learn and demonstrate proper pursed lip breathing techniques or other breathing techniques. ;To learn and exhibit compliance with exercise, home and travel O2 prescription     Long  Term Goals -- -- Exhibits proper breathing techniques, such as pursed lip breathing or other method taught during program session;Maintenance of O2 saturations>88%;Exhibits compliance with exercise, home  and travel O2 prescription;Verbalizes importance of monitoring SPO2 with pulse oximeter  and return demonstration     Comments -- Reviewed PLB technique with pt.  Talked about how it works and it's importance in maintaining their exercise saturations. Worked with patient on PLB today.He has a pulse oximeter to check his oxygen saturation at home. Informed and explained why it is important to have one. Reviewed that oxygen saturations should be 88 percent and above.      Goals/Expected Outcomes -- Short: Become more profiecient at using PLB.   Long: Become independent at using PLB. Short: monitor oxygen at home with exertion. Long: maintain oxygen saturations above 88 percent independently.        Oxygen Discharge (Final Oxygen Re-Evaluation):  Oxygen Re-Evaluation - 08/31/24 1750       Program Oxygen Prescription   Program Oxygen Prescription None      Home Oxygen   Home Oxygen Device Home Concentrator;E-Tanks    Sleep Oxygen Prescription Continuous    Liters per minute 6    Home Exercise Oxygen Prescription None    Home Resting Oxygen Prescription None    Compliance with Home Oxygen Use Yes      Goals/Expected Outcomes   Short Term Goals To learn and understand importance of maintaining oxygen saturations>88%;To learn and understand importance of monitoring SPO2 with pulse oximeter and demonstrate accurate use of the pulse oximeter.;To learn and demonstrate proper pursed lip breathing techniques or other breathing techniques. ;To learn and exhibit compliance with exercise, home and travel O2 prescription    Long  Term Goals Exhibits proper breathing techniques, such as pursed lip breathing or other method taught during program session;Maintenance of O2 saturations>88%;Exhibits compliance with exercise, home  and travel O2 prescription;Verbalizes importance of monitoring SPO2 with pulse oximeter and return demonstration    Comments Worked with patient on PLB today.He has a pulse oximeter to check his oxygen saturation at home. Informed and explained why it is important to have one. Reviewed that oxygen saturations should be 88 percent and above.    Goals/Expected Outcomes Short: monitor oxygen at home with exertion. Long: maintain oxygen saturations above 88 percent independently.          Initial Exercise Prescription:  Initial Exercise Prescription - 07/15/24 1100       Date of Initial Exercise RX and Referring Provider   Date 07/15/24     Referring Provider Dr. Fuad Aleskerov      Oxygen   Maintain Oxygen Saturation 88% or higher      NuStep   Level 2    SPM 50    Minutes 15    METs 4.01      Arm Ergometer   Level 1    Watts 25    RPM 25    Minutes 15    METs 4.01      Biostep-RELP   Level 1    SPM 50    Minutes 15    METs 4.01      Track   Laps 10    Minutes 15    METs 1.54      Prescription Details   Frequency (times per week) 2    Duration Progress to 30 minutes of continuous aerobic without signs/symptoms of physical distress      Intensity   THRR 40-80% of Max Heartrate 141-170    Ratings of Perceived Exertion 11-13    Perceived Dyspnea 0-4      Progression   Progression Continue to progress workloads to maintain intensity without signs/symptoms of physical distress.  Resistance Training   Training Prescription Yes    Weight 5 lb    Reps 10-15          Perform Capillary Blood Glucose checks as needed.  Exercise Prescription Changes:   Exercise Prescription Changes     Row Name 07/15/24 1100 07/29/24 1100 08/17/24 1400 08/31/24 1100       Response to Exercise   Blood Pressure (Admit) 142/96 148/80 122/76 142/80    Blood Pressure (Exercise) 164/98 156/86 172/88 158/72    Blood Pressure (Exit) 156/98 148/80 130/80 128/60    Heart Rate (Admit) 113 bpm 94 bpm 106 bpm 103 bpm    Heart Rate (Exercise) 143 bpm 103 bpm 132 bpm 126 bpm    Heart Rate (Exit) 123 bpm 96 bpm 108 bpm 100 bpm    Oxygen Saturation (Admit) 95 % 95 % 92 % 95 %    Oxygen Saturation (Exercise) 90 % 89 % 90 % 91 %    Oxygen Saturation (Exit) 94 % 96 % 95 % 95 %    Rating of Perceived Exertion (Exercise) 12 14 15 14     Perceived Dyspnea (Exercise) 3 -- -- --    Symptoms used walker, leg pain, ankle/foot pain 6/10 none none none    Comments Results 1st day of exercise -- --    Duration -- Progress to 30 minutes of  aerobic without signs/symptoms of physical distress Progress to 30 minutes of  aerobic  without signs/symptoms of physical distress Progress to 30 minutes of  aerobic without signs/symptoms of physical distress    Intensity -- THRR unchanged THRR unchanged THRR unchanged      Progression   Progression -- Continue to progress workloads to maintain intensity without signs/symptoms of physical distress. Continue to progress workloads to maintain intensity without signs/symptoms of physical distress. Continue to progress workloads to maintain intensity without signs/symptoms of physical distress.    Average METs -- 2.49 2.76 3      Resistance Training   Training Prescription -- Yes -- --    Weight -- 5 lb 5 lb 5 lb    Reps -- 10-15 10-15 10-15      Interval Training   Interval Training -- No No No      NuStep   Level -- -- 4 5    Minutes -- -- 15 15    METs -- -- 3.3 3.8      Biostep-RELP   Level -- 3 3 --    Minutes -- 15 15 --    METs -- 3 3 --      Track   Laps -- 18 18 18     Minutes -- 15 15 15     METs -- 1.98 1.98 1.98      Oxygen   Maintain Oxygen Saturation -- 88% or higher 88% or higher 88% or higher       Exercise Comments:   Exercise Comments     Row Name 07/23/24 1150           Exercise Comments First full day of exercise!  Patient was oriented to gym and equipment including functions, settings, policies, and procedures.  Patient's individual exercise prescription and treatment plan were reviewed.  All starting workloads were established based on the results of the 6 minute walk test done at initial orientation visit.  The plan for exercise progression was also introduced and progression will be customized based on patient's performance and goals.  Exercise Goals and Review:   Exercise Goals     Row Name 07/15/24 1118             Exercise Goals   Increase Physical Activity Yes       Intervention Provide advice, education, support and counseling about physical activity/exercise needs.;Develop an individualized exercise  prescription for aerobic and resistive training based on initial evaluation findings, risk stratification, comorbidities and participant's personal goals.       Expected Outcomes Short Term: Attend rehab on a regular basis to increase amount of physical activity.;Long Term: Add in home exercise to make exercise part of routine and to increase amount of physical activity.;Long Term: Exercising regularly at least 3-5 days a week.       Increase Strength and Stamina Yes       Intervention Provide advice, education, support and counseling about physical activity/exercise needs.;Develop an individualized exercise prescription for aerobic and resistive training based on initial evaluation findings, risk stratification, comorbidities and participant's personal goals.       Expected Outcomes Short Term: Perform resistance training exercises routinely during rehab and add in resistance training at home;Long Term: Improve cardiorespiratory fitness, muscular endurance and strength as measured by increased METs and functional capacity ( );Short Term: Increase workloads from initial exercise prescription for resistance, speed, and METs.       Able to understand and use rate of perceived exertion (RPE) scale Yes       Intervention Provide education and explanation on how to use RPE scale       Expected Outcomes Short Term: Able to use RPE daily in rehab to express subjective intensity level;Long Term:  Able to use RPE to guide intensity level when exercising independently       Able to understand and use Dyspnea scale Yes       Intervention Provide education and explanation on how to use Dyspnea scale       Expected Outcomes Short Term: Able to use Dyspnea scale daily in rehab to express subjective sense of shortness of breath during exertion;Long Term: Able to use Dyspnea scale to guide intensity level when exercising independently       Knowledge and understanding of Target Heart Rate Range (THRR) Yes        Intervention Provide education and explanation of THRR including how the numbers were predicted and where they are located for reference       Expected Outcomes Short Term: Able to state/look up THRR;Short Term: Able to use daily as guideline for intensity in rehab;Long Term: Able to use THRR to govern intensity when exercising independently       Able to check pulse independently Yes       Intervention Provide education and demonstration on how to check pulse in carotid and radial arteries.;Review the importance of being able to check your own pulse for safety during independent exercise       Expected Outcomes Short Term: Able to explain why pulse checking is important during independent exercise;Long Term: Able to check pulse independently and accurately       Understanding of Exercise Prescription Yes       Intervention Provide education, explanation, and written materials on patient's individual exercise prescription       Expected Outcomes Short Term: Able to explain program exercise prescription;Long Term: Able to explain home exercise prescription to exercise independently          Exercise Goals Re-Evaluation :  Exercise Goals Re-Evaluation  Row Name 07/22/24 1733 07/23/24 1150 07/29/24 1126 08/17/24 1416 08/31/24 1140     Exercise Goal Re-Evaluation   Exercise Goals Review -- Increase Physical Activity;Able to understand and use rate of perceived exertion (RPE) scale;Knowledge and understanding of Target Heart Rate Range (THRR);Understanding of Exercise Prescription;Increase Strength and Stamina;Able to understand and use Dyspnea scale;Able to check pulse independently Increase Physical Activity;Understanding of Exercise Prescription;Increase Strength and Stamina Increase Physical Activity;Understanding of Exercise Prescription;Increase Strength and Stamina Increase Physical Activity;Understanding of Exercise Prescription;Increase Strength and Stamina   Comments -- Reviewed RPE and  dyspnea scale, THR and program prescription with pt today.  Pt voiced understanding and was given a copy of goals to take home. Craig [name] is off to a good start in the program and completed his first day in this review period. He was able to walk 18 laps on the track. He worked at level 3 on the biostep. We will continue to monitor his progress in the program. Craig [name] is doing well in rehab. He has been able to maintain both 18 laps on the track and level 3 on the biostep. He was able to increase from level 2 to 4 on the T4 nustep. We will continue to monitor his progress in the program. Craig [name] is doing well in rehab. He was recently able to increase from level 4 to 5 on the T4 nustep. He was also able to maintain 18 laps on the track. We will continue to monitor his progress in the program.   Expected Outcomes -- Short: Use RPE daily to regulate intensity.  Long: Follow program prescription in THR. Short: Continue to follow current exercise prescription. Long: Continue exercise to improve strength and stamina. Short: Continue to follow current exercise prescription. Long: Continue exercise to improve strength and stamina. Short: Continue to increase T4 nustep workload. Long: Continue exercise to improve strength and stamina.      Discharge Exercise Prescription (Final Exercise Prescription Changes):  Exercise Prescription Changes - 08/31/24 1100       Response to Exercise   Blood Pressure (Admit) 142/80    Blood Pressure (Exercise) 158/72    Blood Pressure (Exit) 128/60    Heart Rate (Admit) 103 bpm    Heart Rate (Exercise) 126 bpm    Heart Rate (Exit) 100 bpm    Oxygen Saturation (Admit) 95 %    Oxygen Saturation (Exercise) 91 %    Oxygen Saturation (Exit) 95 %    Rating of Perceived Exertion (Exercise) 14    Symptoms none    Duration Progress to 30 minutes of  aerobic without signs/symptoms of physical distress    Intensity THRR unchanged      Progression   Progression Continue to progress  workloads to maintain intensity without signs/symptoms of physical distress.    Average METs 3      Resistance Training   Weight 5 lb    Reps 10-15      Interval Training   Interval Training No      NuStep   Level 5    Minutes 15    METs 3.8      Track   Laps 18    Minutes 15    METs 1.98      Oxygen   Maintain Oxygen Saturation 88% or higher          Nutrition:  Target Goals: Understanding of nutrition guidelines, daily intake of sodium 1500mg , cholesterol 200mg , calories 30% from fat and 7% or less from saturated fats, daily  to have 5 or more servings of fruits and vegetables.  Education: Nutrition 1 -Group instruction provided by verbal, written material, interactive activities, discussions, models, and posters to present general guidelines for heart healthy nutrition including macronutrients, label reading, and promoting whole foods over processed counterparts. Education serves as pensions consultant of discussion of heart healthy eating for all. Written material provided at class time.     Education: Nutrition 2 -Group instruction provided by verbal, written material, interactive activities, discussions, models, and posters to present general guidelines for heart healthy nutrition including sodium, cholesterol, and saturated fat. Providing guidance of habit forming to improve blood pressure, cholesterol, and body weight. Written material provided at class time.     Biometrics:  Pre Biometrics - 07/15/24 1119       Pre Biometrics   Height 6' 1 (1.854 m)    Weight 277 lb 3.2 oz (125.7 kg)    Waist Circumference 47.5 inches    Hip Circumference 47 inches    Waist to Hip Ratio 1.01 %    BMI (Calculated) 36.58    Single Leg Stand 0 seconds           Nutrition Therapy Plan and Nutrition Goals:  Nutrition Therapy & Goals - 07/15/24 1114       Intervention Plan   Intervention Prescribe, educate and counsel regarding individualized specific dietary modifications  aiming towards targeted core components such as weight, hypertension, lipid management, diabetes, heart failure and other comorbidities.    Expected Outcomes Long Term Goal: Adherence to prescribed nutrition plan.;Short Term Goal: A plan has been developed with personal nutrition goals set during dietitian appointment.;Short Term Goal: Understand basic principles of dietary content, such as calories, fat, sodium, cholesterol and nutrients.          Nutrition Assessments:  MEDIFICTS Score Key: >=70 Need to make dietary changes  40-70 Heart Healthy Diet <= 40 Therapeutic Level Cholesterol Diet  Flowsheet Row Pulmonary Rehab from 07/15/2024 in [facility] Cardiac and Pulmonary Rehab  Picture Your Plate Total Score on Admission 51   Picture Your Plate Scores: <59 Unhealthy dietary pattern with much room for improvement. 41-50 Dietary pattern unlikely to meet recommendations for good health and room for improvement. 51-60 More healthful dietary pattern, with some room for improvement.  >60 Healthy dietary pattern, although there may be some specific behaviors that could be improved.   Nutrition Goals Re-Evaluation:  Nutrition Goals Re-Evaluation     Row Name 08/31/24 1755             Goals   Comment Patient was informed on why it is important to maintain a balanced diet when dealing with Respiratory issues. Explained that it takes a lot of energy to breath and when they are short of breath often they will need to have a good diet to help keep up with the calories they are expending for breathing.       Expected Outcome Short: Choose and plan snacks accordingly to patients caloric intake to improve breathing. Long: Maintain a diet independently that meets their caloric intake to aid in daily shortness of breath.          Nutrition Goals Discharge (Final Nutrition Goals Re-Evaluation):  Nutrition Goals Re-Evaluation - 08/31/24 1755       Goals   Comment Patient was informed on why it is  important to maintain a balanced diet when dealing with Respiratory issues. Explained that it takes a lot of energy to breath and when they are short of  breath often they will need to have a good diet to help keep up with the calories they are expending for breathing.    Expected Outcome Short: Choose and plan snacks accordingly to patients caloric intake to improve breathing. Long: Maintain a diet independently that meets their caloric intake to aid in daily shortness of breath.          Psychosocial: Target Goals: Acknowledge presence or absence of significant depression and/or stress, maximize coping skills, provide positive support system. Participant is able to verbalize types and ability to use techniques and skills needed for reducing stress and depression.   Education: Stress, Anxiety, and Depression - Group verbal and visual presentation to define topics covered.  Reviews how body is impacted by stress, anxiety, and depression.  Also discusses healthy ways to reduce stress and to treat/manage anxiety and depression.  Written material provided at class time.   Education: Sleep Hygiene -Provides group verbal and written instruction about how sleep can affect your health.  Define sleep hygiene, discuss sleep cycles and impact of sleep habits. Review good sleep hygiene tips.    Initial Review & Psychosocial Screening:  Initial Psych Review & Screening - 07/09/24 0946       Initial Review   Current issues with Current Psychotropic Meds;Current Stress Concerns;Current Depression;History of Depression;Current Anxiety/Panic    Source of Stress Concerns Unable to perform yard/household activities    Comments Patient has has a history of alocohol abuse and depression. He states his medication is helping with his modd and is excited for Pulmonary rehab.      Family Dynamics   Good Support System? Yes    Comments He can look to his wife, dad, mother and friends for support.      Barriers    Psychosocial barriers to participate in program The patient should benefit from training in stress management and relaxation.;There are no identifiable barriers or psychosocial needs.      Screening Interventions   Interventions Encouraged to exercise;To provide support and resources with identified psychosocial needs;Provide feedback about the scores to participant    Expected Outcomes Short Term goal: Utilizing psychosocial counselor, staff and physician to assist with identification of specific Stressors or current issues interfering with healing process. Setting desired goal for each stressor or current issue identified.;Long Term Goal: Stressors or current issues are controlled or eliminated.;Short Term goal: Identification and review with participant of any Quality of Life or Depression concerns found by scoring the questionnaire.;Long Term goal: The participant improves quality of Life and PHQ9 Scores as seen by post scores and/or verbalization of changes          Quality of Life Scores:  Scores of 19 and below usually indicate a poorer quality of life in these areas.  A difference of  2-3 points is a clinically meaningful difference.  A difference of 2-3 points in the total score of the Quality of Life Index has been associated with significant improvement in overall quality of life, self-image, physical symptoms, and general health in studies assessing change in quality of life.  PHQ-9: Review Flowsheet       08/31/2024 07/15/2024  Depression screen PHQ 2/9  Decreased Interest 1 1  Down, Depressed, Hopeless 0 1  PHQ - 2 Score 1 2  Altered sleeping 3 1  Tired, decreased energy 2 2  Change in appetite 0 0  Feeling bad or failure about yourself  2 1  Trouble concentrating 0 0  Moving slowly or fidgety/restless 0  0  Suicidal thoughts 0 0  PHQ-9 Score 8 6  Difficult doing work/chores Very difficult Very difficult   Interpretation of Total Score  Total Score Depression  Severity:  1-4 = Minimal depression, 5-9 = Mild depression, 10-14 = Moderate depression, 15-19 = Moderately severe depression, 20-27 = Severe depression   Psychosocial Evaluation and Intervention:  Psychosocial Evaluation - 07/09/24 0951       Psychosocial Evaluation & Interventions   Interventions Encouraged to exercise with the program and follow exercise prescription;Relaxation education    Comments Patient has has a history of alocohol abuse and depression. He states his medication is helping with his modd and is excited for Pulmonary rehab.    Expected Outcomes Short: Start [facility] to help with mood. Long: Maintain a healthy mental state    Continue Psychosocial Services  Follow up required by staff          Psychosocial Re-Evaluation:  Psychosocial Re-Evaluation     Row Name 08/31/24 1807             Psychosocial Re-Evaluation   Current issues with Current Stress Concerns;History of Depression;Current Psychotropic Meds       Comments Reviewed patient health questionnaire (PHQ-9) with patient for follow up. Previously, patients score indicated signs/symptoms of depression.  Reviewed to see if patient is improving symptom wise while in program.  Score declined and patient states that it is because he is in alot of pain. His feet swell up and his knees hurt.       Expected Outcomes Short: Continue to work toward an improvement in PHQ9 scores by attending [facility] regularly. Long: Continue to improve stress and depression coping skills by talking with staff and attending [facility] regularly and work toward a positive mental state.       Interventions Encouraged to attend Pulmonary Rehabilitation for the exercise       Continue Psychosocial Services  Follow up required by staff          Psychosocial Discharge (Final Psychosocial Re-Evaluation):  Psychosocial Re-Evaluation - 08/31/24 1807       Psychosocial Re-Evaluation   Current issues with Current  Stress Concerns;History of Depression;Current Psychotropic Meds    Comments Reviewed patient health questionnaire (PHQ-9) with patient for follow up. Previously, patients score indicated signs/symptoms of depression.  Reviewed to see if patient is improving symptom wise while in program.  Score declined and patient states that it is because he is in alot of pain. His feet swell up and his knees hurt.    Expected Outcomes Short: Continue to work toward an improvement in PHQ9 scores by attending [facility] regularly. Long: Continue to improve stress and depression coping skills by talking with staff and attending [facility] regularly and work toward a positive mental state.    Interventions Encouraged to attend Pulmonary Rehabilitation for the exercise    Continue Psychosocial Services  Follow up required by staff          Education: Education Goals: Education classes will be provided on a weekly basis, covering required topics. Participant will state understanding/return demonstration of topics presented.  Learning Barriers/Preferences:  Learning Barriers/Preferences - 07/09/24 0943       Learning Barriers/Preferences   Learning Barriers None    Learning Preferences None          General Pulmonary Education Topics:  Infection Prevention: - Provides verbal and written material to individual with discussion of infection control including proper hand washing and proper equipment cleaning during exercise session. Flowsheet  Row Pulmonary Rehab from 07/09/2024 in [facility] Cardiac and Pulmonary Rehab  Date 07/09/24  Educator jh  Instruction Review Code 1- Verbalizes Understanding    Falls Prevention: - Provides verbal and written material to individual with discussion of falls prevention and safety. Flowsheet Row Pulmonary Rehab from 07/09/2024 in [facility] Cardiac and Pulmonary Rehab  Date 07/09/24  Educator jh  Instruction Review Code 1- Verbalizes Understanding     Chronic Lung Disease Review: - Group verbal instruction with posters, models, PowerPoint presentations and videos,  to review new updates, new respiratory medications, new advancements in procedures and treatments. Providing information on websites and 800 numbers for continued self-education. Includes information about supplement oxygen, available portable oxygen systems, continuous and intermittent flow rates, oxygen safety, concentrators, and Medicare reimbursement for oxygen. Explanation of Pulmonary Drugs, including class, frequency, complications, importance of spacers, rinsing mouth after steroid MDI's, and proper cleaning methods for nebulizers. Review of basic lung anatomy and physiology related to function, structure, and complications of lung disease. Review of risk factors. Discussion about methods for diagnosing sleep apnea and types of masks and machines for OSA. Includes a review of the use of types of environmental controls: home humidity, furnaces, filters, dust mite/pet prevention, HEPA vacuums. Discussion about weather changes, air quality and the benefits of nasal washing. Instruction on Warning signs, infection symptoms, calling MD promptly, preventive modes, and value of vaccinations. Review of effective airway clearance, coughing and/or vibration techniques. Emphasizing that all should Create an Action Plan. Written material provided at class time.   AED/CPR: - Group verbal and written instruction with the use of models to demonstrate the basic use of the AED with the basic ABC's of resuscitation.    Tests and Procedures:  - Group verbal and visual presentation and models provide information about basic cardiac anatomy and function. Reviews the testing methods done to diagnose heart disease and the outcomes of the test results. Describes the treatment choices: Medical Management, Angioplasty, or Coronary Bypass Surgery for treating various heart conditions including  Myocardial Infarction, Angina, Valve Disease, and Cardiac Arrhythmias.  Written material provided at class time.   Medication Safety: - Group verbal and visual instruction to review commonly prescribed medications for heart and lung disease. Reviews the medication, class of the drug, and side effects. Includes the steps to properly store meds and maintain the prescription regimen.  Written material given at graduation.   Other: -Provides group and verbal instruction on various topics (see comments)   Knowledge Questionnaire Score:  Knowledge Questionnaire Score - 07/22/24 1815       Knowledge Questionnaire Score   Pre Score 13/18           Core Components/Risk Factors/Patient Goals at Admission:  Personal Goals and Risk Factors at Admission - 07/09/24 0943       Core Components/Risk Factors/Patient Goals on Admission    Weight Management Yes;Weight Maintenance    Intervention Weight Management: Develop a combined nutrition and exercise program designed to reach desired caloric intake, while maintaining appropriate intake of nutrient and fiber, sodium and fats, and appropriate energy expenditure required for the weight goal.;Weight Management: Provide education and appropriate resources to help participant work on and attain dietary goals.;Weight Management/Obesity: Establish reasonable short term and long term weight goals.;Obesity: Provide education and appropriate resources to help participant work on and attain dietary goals.    Expected Outcomes Short Term: Continue to assess and modify interventions until short term weight is achieved;Long Term: Adherence to nutrition and physical activity/exercise program aimed  toward attainment of established weight goal;Weight Maintenance: Understanding of the daily nutrition guidelines, which includes 25-35% calories from fat, 7% or less cal from saturated fats, less than 200mg  cholesterol, less than 1.5gm of sodium, & 5 or more servings of  fruits and vegetables daily;Understanding recommendations for meals to include 15-35% energy as protein, 25-35% energy from fat, 35-60% energy from carbohydrates, less than 200mg  of dietary cholesterol, 20-35 gm of total fiber daily;Understanding of distribution of calorie intake throughout the day with the consumption of 4-5 meals/snacks    Tobacco Cessation Yes    Number of packs per day Patient does dip and does not have an interest in quitting.    Intervention Assist the participant in steps to quit. Provide individualized education and counseling about committing to Tobacco Cessation, relapse prevention, and pharmacological support that can be provided by physician.;Education officer, environmental, assist with locating and accessing local/national Quit Smoking programs, and support quit date choice.    Expected Outcomes Short Term: Will demonstrate readiness to quit, by selecting a quit date.;Long Term: Complete abstinence from all tobacco products for at least 12 months from quit date.;Short Term: Will quit all tobacco product use, adhering to prevention of relapse plan.    Improve shortness of breath with ADL's Yes    Intervention Provide education, individualized exercise plan and daily activity instruction to help decrease symptoms of SOB with activities of daily living.    Expected Outcomes Short Term: Improve cardiorespiratory fitness to achieve a reduction of symptoms when performing ADLs;Long Term: Be able to perform more ADLs without symptoms or delay the onset of symptoms          Education:Diabetes - Individual verbal and written instruction to review signs/symptoms of diabetes, desired ranges of glucose level fasting, after meals and with exercise. Acknowledge that pre and post exercise glucose checks will be done for 3 sessions at entry of program.   Know Your Numbers and Heart Failure: - Group verbal and visual instruction to discuss disease risk factors for cardiac and pulmonary  disease and treatment options.  Reviews associated critical values for Overweight/Obesity, Hypertension, Cholesterol, and Diabetes.  Discusses basics of heart failure: signs/symptoms and treatments.  Introduces Heart Failure Zone chart for action plan for heart failure. Written material provided at class time.   Core Components/Risk Factors/Patient Goals Review:   Goals and Risk Factor Review     Row Name 08/31/24 1757             Core Components/Risk Factors/Patient Goals Review   Personal Goals Review Weight Management/Obesity;Other;Improve shortness of breath with ADL's       Review Craig [name] states that he forgets to take his blood pressure medication at times. He has been  Hypertensive and informed him that taking his prescribed medication is impprtant. He has been losing weight and still wants to lose a few more pounds.Spoke to patient about their shortness of breath and what they can do to improve. Patient has been informed of breathing techniques when starting the program. Patient is informed to tell staff if they have had any med changes and that certain meds they are taking or not taking can be causing shortness of breath.       Expected Outcomes Short: Attend [facility] regularly to improve shortness of breath with ADL's. Long: maintain independence with ADL's          Core Components/Risk Factors/Patient Goals at Discharge (Final Review):   Goals and Risk Factor Review - 08/31/24 1757  Core Components/Risk Factors/Patient Goals Review   Personal Goals Review Weight Management/Obesity;Other;Improve shortness of breath with ADL's    Review Craig [name] states that he forgets to take his blood pressure medication at times. He has been  Hypertensive and informed him that taking his prescribed medication is impprtant. He has been losing weight and still wants to lose a few more pounds.Spoke to patient about their shortness of breath and what they can do to improve. Patient has been informed  of breathing techniques when starting the program. Patient is informed to tell staff if they have had any med changes and that certain meds they are taking or not taking can be causing shortness of breath.    Expected Outcomes Short: Attend [facility] regularly to improve shortness of breath with ADL's. Long: maintain independence with ADL's          ITP Comments:  ITP Comments     Row Name 07/09/24 0955 07/15/24 1112 07/22/24 1732 07/23/24 1150 08/05/24 0847   ITP Comments Virtual Visit completed. Patient informed on EP and RD appointment and 6 Minute walk test. Patient also informed of patient health questionnaires on My Chart. Patient Verbalizes understanding. Visit diagnosis can be found in [facility] 06/24/2024. Completed and gym orientation for respiratory care services. Initial ITP created and sent for review to Dr. Fuad Aleskerov, Medical Director. -- First full day of exercise!  Patient was oriented to gym and equipment including functions, settings, policies, and procedures.  Patient's individual exercise prescription and treatment plan were reviewed.  All starting workloads were established based on the results of the 6 minute walk test done at initial orientation visit.  The plan for exercise progression was also introduced and progression will be customized based on patient's performance and goals. 30 Day review completed. Medical Director ITP review done, changes made as directed, and signed approval by Medical Director. New to program.    Row Name 09/02/24 1035           ITP Comments 30 Day review completed. Medical Director ITP review done, changes made as directed, and signed approval by Medical Director.          Comments: 30 Day Review     [1]  Current Outpatient Medications:    acetaminophen  (TYLENOL ) 650 MG CR tablet, Take 650 mg by mouth every 8 (eight) hours as needed., Disp: , Rfl:    albuterol  (ACCUNEB ) 1.25 MG/3ML nebulizer solution, Take 1 ampule by nebulization  every 6 (six) hours as needed for wheezing or shortness of breath. (Patient not taking: Reported on 07/09/2024), Disp: , Rfl:    Allopurinol  200 MG TABS, Take 400 mg by mouth daily. (Patient not taking: Reported on 07/09/2024), Disp: , Rfl:    amLODipine  (NORVASC ) 5 MG tablet, Take 5 mg by mouth daily. (Patient not taking: Reported on 07/09/2024), Disp: , Rfl:    ascorbic acid  (VITAMIN C ) 500 MG tablet, Take 2 tablets (1,000 mg total) by mouth at bedtime. (Patient not taking: Reported on 07/09/2024), Disp: , Rfl:    blood glucose meter kit and supplies KIT, Dispense based on patient and insurance preference. Use up to four times daily as directed. (Patient not taking: Reported on 07/09/2024), Disp: 1 each, Rfl: 0   cefadroxil  (DURICEF) 500 MG capsule, Take 500 mg by mouth daily. (Patient not taking: Reported on 07/09/2024), Disp: , Rfl:    cetirizine  (ZYRTEC ) 10 MG tablet, Take 10 mg by mouth daily. (Patient not taking: Reported on 07/09/2024), Disp: , Rfl:  Cholecalciferol  (VITAMIN D3) 25 MCG (1000 UT) CAPS, Take 1 capsule by mouth daily. (Patient not taking: Reported on 07/09/2024), Disp: , Rfl:    clonazePAM  (KLONOPIN ) 1 MG tablet, Take 1 mg by mouth 2 (two) times daily as needed. (Patient not taking: Reported on 07/09/2024), Disp: , Rfl:    colchicine  0.6 MG tablet, Take 0.6 mg by mouth daily. (Patient not taking: Reported on 07/09/2024), Disp: , Rfl:    docusate sodium  (COLACE) 100 MG capsule, Take 1 capsule (100 mg total) by mouth 2 (two) times daily as needed for mild constipation. (Patient not taking: Reported on 07/09/2024), Disp: 10 capsule, Rfl: 0   DULoxetine  (CYMBALTA ) 30 MG capsule, Take 30 mg by mouth daily. Take along with one 60 mg capsule for total 90 mg once daily (Patient not taking: Reported on 07/09/2024), Disp: , Rfl:    DULoxetine  (CYMBALTA ) 60 MG capsule, Take 60 mg by mouth daily. Take along with one 30 mg capsule for total 90 mg once daily (Patient not taking: Reported on  07/09/2024), Disp: , Rfl:    folic acid  (FOLVITE ) 1 MG tablet, Take 1 mg by mouth daily. (Patient not taking: Reported on 07/09/2024), Disp: , Rfl:    hydroxychloroquine  (PLAQUENIL ) 200 MG tablet, Take 200 mg by mouth 2 (two) times daily. (Patient not taking: Reported on 07/09/2024), Disp: , Rfl:    metoprolol  succinate (TOPROL -XL) 50 MG 24 hr tablet, Take 3 tablets (150 mg total) by mouth daily. Take with or immediately following a meal. (Patient not taking: Reported on 07/09/2024), Disp: 60 tablet, Rfl: 1   metoprolol  succinate (TOPROL -XL) 50 MG 24 hr tablet, 3 tablets Orally Once a day; Duration: 90 days (Patient not taking: Reported on 07/09/2024), Disp: , Rfl:    nicotine  (NICODERM CQ  - DOSED IN MG/24 HOURS) 21 mg/24hr patch, Place 1 patch (21 mg total) onto the skin daily. (Patient not taking: Reported on 07/09/2024), Disp: 28 patch, Rfl: 0   nystatin  (MYCOSTATIN /NYSTOP ) powder, Apply topically 3 (three) times daily as needed. (Patient not taking: Reported on 07/09/2024), Disp: , Rfl:    ondansetron  (ZOFRAN ) 8 MG tablet, 1 tablet on the tongue and allow to dissolve as needed Orally every 6 hours as needed; Duration: 30 days (Patient not taking: Reported on 07/09/2024), Disp: , Rfl:    ondansetron  (ZOFRAN -ODT) 4 MG disintegrating tablet, Take 4 mg by mouth every 8 (eight) hours as needed. (Patient not taking: Reported on 07/09/2024), Disp: , Rfl:    oxyCODONE  (OXY IR/ROXICODONE ) 5 MG immediate release tablet, Take 5 mg by mouth every 4 (four) hours as needed. (Patient not taking: Reported on 07/09/2024), Disp: , Rfl:    OXYGEN, 2.5 LPM [place] PRN, Disp: , Rfl:    OXYGEN, 2.5 LPM [place] PRN, Disp: , Rfl:    polyethylene glycol (MIRALAX  / GLYCOLAX ) 17 g packet, Take 17 g by mouth daily. (Patient not taking: Reported on 07/09/2024), Disp: 14 each, Rfl: 0   potassium chloride  SA (KLOR-CON  M) 20 MEQ tablet, Take 1 tablet (20 mEq total) by mouth 2 (two) times daily. (Patient not taking: Reported on 07/09/2024),  Disp: 60 tablet, Rfl: 1   pregabalin  (LYRICA ) 200 MG capsule, Take 200 mg by mouth 2 (two) times daily., Disp: , Rfl:    rivaroxaban  (XARELTO ) 20 MG TABS tablet, Take 20 mg by mouth daily with supper. (Patient not taking: Reported on 07/09/2024), Disp: , Rfl:    rivaroxaban  (XARELTO ) 20 MG TABS tablet, 1 tablet with food Orally Once a day; Duration: 90  days (Patient not taking: Reported on 07/09/2024), Disp: , Rfl:    rOPINIRole  (REQUIP ) 0.25 MG tablet, Take 1 tablet (0.25 mg total) by mouth at bedtime. (Patient not taking: Reported on 07/09/2024), Disp: 30 tablet, Rfl: 0   saccharomyces boulardii (FLORASTOR) 250 MG capsule, Take 250 mg by mouth 2 (two) times daily. (Patient not taking: Reported on 07/09/2024), Disp: , Rfl:    testosterone cypionate (DEPOTESTOSTERONE CYPIONATE) 200 MG/ML injection, Inject 100 mg into the muscle once a week. (Patient not taking: Reported on 07/09/2024), Disp: , Rfl:    zinc  sulfate 220 (50 Zn) MG capsule, Take 220 mg by mouth daily. (Patient not taking: Reported on 07/09/2024), Disp: , Rfl:  [2]  Social History Tobacco Use  Smoking Status Never  Smokeless Tobacco Current   Types: Chew  Tobacco Comments   Not ready to quit.

## 2024-09-03 ENCOUNTER — Encounter: Admitting: *Deleted

## 2024-09-03 ENCOUNTER — Encounter

## 2024-09-03 DIAGNOSIS — R0600 Dyspnea, unspecified: Secondary | ICD-10-CM

## 2024-09-03 NOTE — Progress Notes (Signed)
 Daily Session Note  Patient Details  Name: Craig [name] MRN: [id number] Date of Birth: 1990/12/09 Referring Provider:   Flowsheet Row Pulmonary Rehab from 07/15/2024 in [facility] Cardiac and Pulmonary Rehab  Referring Provider Dr. Halina Picking    Encounter Date: 09/03/2024  Check In:  Session Check In - 09/03/24 1735       Check-In   Supervising physician immediately available to respond to emergencies See telemetry face sheet for immediately available ER MD    Location [facility]-Cardiac & Pulmonary Rehab    Staff Present Othel Durand, RN, BSN, CCRP;Laureen Delores, BS, RRT, CPFT;Joseph [employer], BS, Exercise Physiologist    Virtual Visit No    Medication changes reported     No    Fall or balance concerns reported    No    Warm-up and Cool-down Performed on first and last piece of equipment    Resistance Training Performed Yes    VAD Patient? No    PAD/SET Patient? No      Pain Assessment   Currently in Pain? No/denies             Tobacco Use History[1]  Goals Met:  Proper associated with RPD/PD & O2 Sat Independence with exercise equipment Exercise tolerated well No report of concerns or symptoms today  Goals Unmet:  Not Applicable  Comments: Pt able to follow exercise prescription today without complaint.  Will continue to monitor for progression.    Dr. Oneil Pinal is Medical Director for [facility] Cardiac Rehabilitation.  Dr. Fuad Aleskerov is Medical Director for [facility] Pulmonary Rehabilitation.    [1]  Social History Tobacco Use  Smoking Status Never  Smokeless Tobacco Current   Types: Chew  Tobacco Comments   Not ready to quit.

## 2024-09-07 ENCOUNTER — Encounter: Admitting: Emergency Medicine

## 2024-09-07 DIAGNOSIS — R0600 Dyspnea, unspecified: Secondary | ICD-10-CM

## 2024-09-07 NOTE — Progress Notes (Signed)
 Daily Session Note  Patient Details  Name: Craig [name] MRN: [id number] Date of Birth: 07-15-1991 Referring Provider:   Flowsheet Row Pulmonary Rehab from 07/15/2024 in [facility] Cardiac and Pulmonary Rehab  Referring Provider Dr. Halina Picking    Encounter Date: 09/07/2024  Check In:  Session Check In - 09/07/24 1728       Check-In   Supervising physician immediately available to respond to emergencies See telemetry face sheet for immediately available ER MD    Location [facility]-Cardiac & Pulmonary Rehab    Staff Present Leita Franks RN,BSN;Joseph [facility] Dyane BS, ACSM CEP, Exercise Physiologist    Virtual Visit No    Medication changes reported     No    Fall or balance concerns reported    No    Warm-up and Cool-down Performed on first and last piece of equipment    Resistance Training Performed Yes    VAD Patient? No    PAD/SET Patient? No      Pain Assessment   Currently in Pain? No/denies             Tobacco Use History[1]  Goals Met:  Proper associated with RPD/PD & O2 Sat Independence with exercise equipment Using PLB without cueing & demonstrates good technique Exercise tolerated well No report of concerns or symptoms today Strength training completed today  Goals Unmet:  Not Applicable  Comments: Pt able to follow exercise prescription today without complaint.  Will continue to monitor for progression.    Dr. Oneil Pinal is Medical Director for [facility] Cardiac Rehabilitation.  Dr. Fuad Aleskerov is Medical Director for [facility] Pulmonary Rehabilitation.    [1]  Social History Tobacco Use  Smoking Status Never  Smokeless Tobacco Current   Types: Chew  Tobacco Comments   Not ready to quit.

## 2024-09-08 ENCOUNTER — Encounter

## 2024-09-10 ENCOUNTER — Encounter

## 2024-09-14 ENCOUNTER — Encounter

## 2024-09-15 ENCOUNTER — Encounter

## 2024-09-17 ENCOUNTER — Encounter: Admitting: Emergency Medicine

## 2024-09-17 ENCOUNTER — Encounter

## 2024-09-17 DIAGNOSIS — R0600 Dyspnea, unspecified: Secondary | ICD-10-CM

## 2024-09-17 NOTE — Progress Notes (Signed)
 Daily Session Note  Patient Details  Name: [name] MRN: [id number] Date of Birth: 12/29/1990 Referring Provider:   Flowsheet Row Pulmonary Rehab from 07/15/2024 in [facility] Cardiac and Pulmonary Rehab  Referring Provider Dr. Halina Picking    Encounter Date: 09/17/2024  Check In:  Session Check In - 09/17/24 1744       Check-In   Supervising physician immediately available to respond to emergencies See telemetry face sheet for immediately available ER MD    Location [facility]-Cardiac & Pulmonary Rehab    Staff Present Leita Franks RN,BSN;Joseph [employer] BS, Exercise Physiologist    Virtual Visit No    Medication changes reported     No    Fall or balance concerns reported    No    Warm-up and Cool-down Performed on first and last piece of equipment    Resistance Training Performed Yes    VAD Patient? No    PAD/SET Patient? No      Pain Assessment   Currently in Pain? No/denies             Tobacco Use History[1]  Goals Met:  Proper associated with RPD/PD & O2 Sat Independence with exercise equipment Using PLB without cueing & demonstrates good technique Exercise tolerated well No report of concerns or symptoms today Strength training completed today  Goals Unmet:  Not Applicable  Comments: Pt able to follow exercise prescription today without complaint.  Will continue to monitor for progression.    Dr. Oneil Pinal is Medical Director for [facility] Cardiac Rehabilitation.  Dr. Fuad Aleskerov is Medical Director for [facility] Pulmonary Rehabilitation.    [1]  Social History Tobacco Use  Smoking Status Never  Smokeless Tobacco Current   Types: Chew  Tobacco Comments   Not ready to quit.

## 2024-09-21 ENCOUNTER — Encounter

## 2024-09-22 ENCOUNTER — Encounter

## 2024-09-24 ENCOUNTER — Encounter

## 2024-09-24 ENCOUNTER — Encounter: Admitting: Emergency Medicine

## 2024-09-24 DIAGNOSIS — R0600 Dyspnea, unspecified: Secondary | ICD-10-CM

## 2024-09-24 NOTE — Progress Notes (Signed)
 Daily Session Note  Patient Details  Name: Craig [name] MRN: [id number] Date of Birth: [birth year] Referring Provider:   Flowsheet Row Pulmonary Rehab from 07/15/2024 in [facility] Cardiac and Pulmonary Rehab  Referring Provider Dr. Halina Picking    Encounter Date: 09/24/2024  Check In:  Session Check In - 09/24/24 1720       Check-In   Supervising physician immediately available to respond to emergencies See telemetry face sheet for immediately available ER MD    Location [facility]-Cardiac & Pulmonary Rehab    Staff Present Leita Franks RN,BSN;Joseph [facility] BS, Exercise Physiologist;Margaret Best, MS, Exercise Physiologist    Virtual Visit No    Medication changes reported     No    Fall or balance concerns reported    No    Warm-up and Cool-down Performed on first and last piece of equipment    Resistance Training Performed Yes    VAD Patient? No    PAD/SET Patient? No      Pain Assessment   Currently in Pain? No/denies             Tobacco Use History[1]  Goals Met:  Proper associated with RPD/PD & O2 Sat Independence with exercise equipment Using PLB without cueing & demonstrates good technique Exercise tolerated well No report of concerns or symptoms today Strength training completed today  Goals Unmet:  Not Applicable  Comments: Pt able to follow exercise prescription today without complaint.  Will continue to monitor for progression.    Dr. Oneil Pinal is Medical Director for [facility] Cardiac Rehabilitation.  Dr. Fuad Aleskerov is Medical Director for [facility] Pulmonary Rehabilitation.    [1]  Social History Tobacco Use  Smoking Status Never  Smokeless Tobacco Current   Types: Chew  Tobacco Comments   Not ready to quit.

## 2024-09-28 ENCOUNTER — Encounter

## 2024-09-29 ENCOUNTER — Encounter

## 2024-10-01 ENCOUNTER — Encounter

## 2024-10-05 ENCOUNTER — Encounter

## 2024-10-06 ENCOUNTER — Encounter

## 2024-10-08 ENCOUNTER — Encounter

## 2024-10-12 ENCOUNTER — Encounter

## 2024-10-13 ENCOUNTER — Encounter

## 2024-10-15 ENCOUNTER — Encounter

## 2024-10-19 ENCOUNTER — Encounter

## 2024-10-22 ENCOUNTER — Encounter

## 2024-10-26 ENCOUNTER — Encounter

## 2024-10-29 ENCOUNTER — Encounter

## 2024-11-02 ENCOUNTER — Encounter

## 2024-11-05 ENCOUNTER — Encounter

## 2024-11-09 ENCOUNTER — Encounter

## 2024-11-12 ENCOUNTER — Encounter

## 2024-11-16 ENCOUNTER — Encounter

## 2024-11-19 ENCOUNTER — Encounter

## 2024-11-23 ENCOUNTER — Encounter

## 2024-11-26 ENCOUNTER — Encounter
